# Patient Record
Sex: Female | Born: 1935 | Race: White | Hispanic: No | Marital: Married | Smoking: Never smoker
Health system: Southern US, Community
[De-identification: ages and names within clinical notes are randomized; demographics above are authoritative.]

## PROBLEM LIST (undated history)

## (undated) DIAGNOSIS — Z5189 Encounter for other specified aftercare: Secondary | ICD-10-CM

## (undated) DIAGNOSIS — I3139 Other pericardial effusion (noninflammatory): Secondary | ICD-10-CM

## (undated) DIAGNOSIS — T7841XA Arthus phenomenon, initial encounter: Secondary | ICD-10-CM

## (undated) DIAGNOSIS — R159 Full incontinence of feces: Secondary | ICD-10-CM

## (undated) DIAGNOSIS — N2889 Other specified disorders of kidney and ureter: Secondary | ICD-10-CM

## (undated) DIAGNOSIS — B3781 Candidal esophagitis: Secondary | ICD-10-CM

## (undated) DIAGNOSIS — J849 Interstitial pulmonary disease, unspecified: Secondary | ICD-10-CM

## (undated) DIAGNOSIS — J9610 Chronic respiratory failure, unspecified whether with hypoxia or hypercapnia: Secondary | ICD-10-CM

## (undated) DIAGNOSIS — D649 Anemia, unspecified: Secondary | ICD-10-CM

## (undated) DIAGNOSIS — I2699 Other pulmonary embolism without acute cor pulmonale: Secondary | ICD-10-CM

## (undated) DIAGNOSIS — H1820 Unspecified corneal edema: Secondary | ICD-10-CM

## (undated) DIAGNOSIS — Q2730 Arteriovenous malformation, site unspecified: Secondary | ICD-10-CM

## (undated) DIAGNOSIS — N840 Polyp of corpus uteri: Secondary | ICD-10-CM

## (undated) DIAGNOSIS — I272 Pulmonary hypertension, unspecified: Secondary | ICD-10-CM

## (undated) DIAGNOSIS — N1832 Chronic kidney disease, stage 3b: Secondary | ICD-10-CM

## (undated) DIAGNOSIS — K219 Gastro-esophageal reflux disease without esophagitis: Secondary | ICD-10-CM

## (undated) DIAGNOSIS — M341 CR(E)ST syndrome: Secondary | ICD-10-CM

## (undated) DIAGNOSIS — K31819 Angiodysplasia of stomach and duodenum without bleeding: Secondary | ICD-10-CM

## (undated) DIAGNOSIS — Z9889 Other specified postprocedural states: Secondary | ICD-10-CM

## (undated) DIAGNOSIS — D126 Benign neoplasm of colon, unspecified: Secondary | ICD-10-CM

## (undated) DIAGNOSIS — C50919 Malignant neoplasm of unspecified site of unspecified female breast: Secondary | ICD-10-CM

## (undated) DIAGNOSIS — H269 Unspecified cataract: Secondary | ICD-10-CM

## (undated) DIAGNOSIS — N189 Chronic kidney disease, unspecified: Secondary | ICD-10-CM

## (undated) DIAGNOSIS — M349 Systemic sclerosis, unspecified: Secondary | ICD-10-CM

## (undated) DIAGNOSIS — C649 Malignant neoplasm of unspecified kidney, except renal pelvis: Secondary | ICD-10-CM

## (undated) DIAGNOSIS — H18529 Epithelial (juvenile) corneal dystrophy, unspecified eye: Secondary | ICD-10-CM

## (undated) DIAGNOSIS — R112 Nausea with vomiting, unspecified: Secondary | ICD-10-CM

## (undated) DIAGNOSIS — I350 Nonrheumatic aortic (valve) stenosis: Secondary | ICD-10-CM

## (undated) HISTORY — DX: Polyp of corpus uteri: N84.0

## (undated) HISTORY — DX: Unspecified cataract: H26.9

## (undated) HISTORY — DX: Benign neoplasm of colon, unspecified: D12.6

## (undated) HISTORY — PX: KIDNEY SURGERY: SHX687

## (undated) HISTORY — DX: Malignant neoplasm of unspecified kidney, except renal pelvis: C64.9

## (undated) HISTORY — DX: Epithelial (juvenile) corneal dystrophy, unspecified eye: H18.529

## (undated) HISTORY — DX: Pulmonary hypertension, unspecified: I27.20

## (undated) HISTORY — PX: TONSILLECTOMY: SUR1361

## (undated) HISTORY — DX: Other pericardial effusion (noninflammatory): I31.39

## (undated) HISTORY — DX: Other specified disorders of kidney and ureter: N28.89

## (undated) HISTORY — DX: Full incontinence of feces: R15.9

## (undated) HISTORY — DX: Arthus phenomenon, initial encounter: T78.41XA

## (undated) HISTORY — DX: Candidal esophagitis: B37.81

## (undated) HISTORY — DX: Angiodysplasia of stomach and duodenum without bleeding: K31.819

## (undated) HISTORY — DX: Unspecified corneal edema: H18.20

## (undated) HISTORY — PX: OTHER SURGICAL HISTORY: SHX169

## (undated) HISTORY — DX: Systemic sclerosis, unspecified: M34.9

## (undated) HISTORY — PX: TUBAL LIGATION: SHX77

## (undated) HISTORY — DX: Arteriovenous malformation, site unspecified: Q27.30

## (undated) HISTORY — DX: Interstitial pulmonary disease, unspecified: J84.9

## (undated) HISTORY — DX: Malignant neoplasm of unspecified site of unspecified female breast: C50.919

---

## 1960-10-20 HISTORY — PX: APPENDECTOMY: SHX54

## 1987-10-21 DIAGNOSIS — C50919 Malignant neoplasm of unspecified site of unspecified female breast: Secondary | ICD-10-CM

## 1987-10-21 HISTORY — DX: Malignant neoplasm of unspecified site of unspecified female breast: C50.919

## 1987-10-21 HISTORY — PX: BREAST LUMPECTOMY: SHX2

## 1998-08-17 ENCOUNTER — Ambulatory Visit (HOSPITAL_COMMUNITY): Admission: RE | Admit: 1998-08-17 | Discharge: 1998-08-17 | Payer: Self-pay | Admitting: Family Medicine

## 1998-08-23 ENCOUNTER — Ambulatory Visit: Admission: RE | Admit: 1998-08-23 | Discharge: 1998-08-23 | Payer: Self-pay | Admitting: Family Medicine

## 1999-01-10 ENCOUNTER — Ambulatory Visit (HOSPITAL_BASED_OUTPATIENT_CLINIC_OR_DEPARTMENT_OTHER): Admission: RE | Admit: 1999-01-10 | Discharge: 1999-01-10 | Payer: Self-pay | Admitting: Orthopedic Surgery

## 1999-03-28 ENCOUNTER — Other Ambulatory Visit: Admission: RE | Admit: 1999-03-28 | Discharge: 1999-03-28 | Payer: Self-pay | Admitting: Gynecology

## 1999-05-17 ENCOUNTER — Ambulatory Visit: Admission: RE | Admit: 1999-05-17 | Discharge: 1999-05-17 | Payer: Self-pay | Admitting: Internal Medicine

## 2000-02-13 ENCOUNTER — Encounter (INDEPENDENT_AMBULATORY_CARE_PROVIDER_SITE_OTHER): Payer: Self-pay | Admitting: Specialist

## 2000-02-13 ENCOUNTER — Other Ambulatory Visit: Admission: RE | Admit: 2000-02-13 | Discharge: 2000-02-13 | Payer: Self-pay | Admitting: Internal Medicine

## 2000-02-18 ENCOUNTER — Encounter (HOSPITAL_COMMUNITY): Admission: RE | Admit: 2000-02-18 | Discharge: 2000-05-18 | Payer: Self-pay | Admitting: Internal Medicine

## 2000-04-30 ENCOUNTER — Other Ambulatory Visit: Admission: RE | Admit: 2000-04-30 | Discharge: 2000-04-30 | Payer: Self-pay | Admitting: Gynecology

## 2000-09-17 ENCOUNTER — Encounter: Admission: RE | Admit: 2000-09-17 | Discharge: 2000-09-17 | Payer: Self-pay | Admitting: Family Medicine

## 2000-09-17 ENCOUNTER — Encounter: Payer: Self-pay | Admitting: Family Medicine

## 2001-05-04 ENCOUNTER — Other Ambulatory Visit: Admission: RE | Admit: 2001-05-04 | Discharge: 2001-05-04 | Payer: Self-pay | Admitting: Gynecology

## 2001-10-20 DIAGNOSIS — I2699 Other pulmonary embolism without acute cor pulmonale: Secondary | ICD-10-CM

## 2001-10-20 HISTORY — DX: Other pulmonary embolism without acute cor pulmonale: I26.99

## 2001-11-30 ENCOUNTER — Encounter: Payer: Self-pay | Admitting: Family Medicine

## 2001-11-30 ENCOUNTER — Encounter: Admission: RE | Admit: 2001-11-30 | Discharge: 2001-11-30 | Payer: Self-pay | Admitting: Family Medicine

## 2002-01-06 ENCOUNTER — Inpatient Hospital Stay (HOSPITAL_COMMUNITY): Admission: EM | Admit: 2002-01-06 | Discharge: 2002-01-13 | Payer: Self-pay | Admitting: Emergency Medicine

## 2002-08-10 ENCOUNTER — Encounter: Payer: Self-pay | Admitting: Emergency Medicine

## 2002-08-10 ENCOUNTER — Inpatient Hospital Stay (HOSPITAL_COMMUNITY): Admission: EM | Admit: 2002-08-10 | Discharge: 2002-08-19 | Payer: Self-pay | Admitting: Emergency Medicine

## 2002-08-11 ENCOUNTER — Encounter: Payer: Self-pay | Admitting: Family Medicine

## 2002-08-15 ENCOUNTER — Encounter: Payer: Self-pay | Admitting: Internal Medicine

## 2002-10-18 ENCOUNTER — Other Ambulatory Visit: Admission: RE | Admit: 2002-10-18 | Discharge: 2002-10-18 | Payer: Self-pay | Admitting: Gynecology

## 2003-07-10 ENCOUNTER — Encounter (HOSPITAL_COMMUNITY): Admission: RE | Admit: 2003-07-10 | Discharge: 2003-07-10 | Payer: Self-pay | Admitting: Internal Medicine

## 2003-11-23 ENCOUNTER — Other Ambulatory Visit: Admission: RE | Admit: 2003-11-23 | Discharge: 2003-11-23 | Payer: Self-pay | Admitting: Gynecology

## 2003-12-11 ENCOUNTER — Ambulatory Visit (HOSPITAL_COMMUNITY): Admission: RE | Admit: 2003-12-11 | Discharge: 2003-12-11 | Payer: Self-pay | Admitting: Internal Medicine

## 2003-12-11 ENCOUNTER — Encounter: Payer: Self-pay | Admitting: Internal Medicine

## 2003-12-11 ENCOUNTER — Encounter (INDEPENDENT_AMBULATORY_CARE_PROVIDER_SITE_OTHER): Payer: Self-pay | Admitting: *Deleted

## 2004-08-30 ENCOUNTER — Ambulatory Visit: Payer: Self-pay | Admitting: Cardiology

## 2004-09-06 ENCOUNTER — Ambulatory Visit: Payer: Self-pay | Admitting: Internal Medicine

## 2004-09-30 ENCOUNTER — Ambulatory Visit: Payer: Self-pay | Admitting: Cardiovascular Disease

## 2004-10-04 ENCOUNTER — Ambulatory Visit: Payer: Self-pay | Admitting: Internal Medicine

## 2004-10-18 ENCOUNTER — Ambulatory Visit: Payer: Self-pay | Admitting: Internal Medicine

## 2004-10-28 ENCOUNTER — Ambulatory Visit: Payer: Self-pay | Admitting: Cardiology

## 2004-11-08 ENCOUNTER — Ambulatory Visit: Payer: Self-pay | Admitting: Internal Medicine

## 2004-11-13 ENCOUNTER — Encounter: Admission: RE | Admit: 2004-11-13 | Discharge: 2004-11-13 | Payer: Self-pay | Admitting: Internal Medicine

## 2004-11-13 ENCOUNTER — Ambulatory Visit: Payer: Self-pay | Admitting: Internal Medicine

## 2004-11-15 ENCOUNTER — Ambulatory Visit: Payer: Self-pay | Admitting: Internal Medicine

## 2004-11-27 ENCOUNTER — Ambulatory Visit: Payer: Self-pay | Admitting: Internal Medicine

## 2004-12-13 ENCOUNTER — Ambulatory Visit: Payer: Self-pay | Admitting: Internal Medicine

## 2004-12-25 ENCOUNTER — Ambulatory Visit: Payer: Self-pay | Admitting: Cardiology

## 2005-01-10 ENCOUNTER — Ambulatory Visit: Payer: Self-pay | Admitting: Internal Medicine

## 2005-01-16 ENCOUNTER — Ambulatory Visit: Payer: Self-pay | Admitting: Internal Medicine

## 2005-01-24 ENCOUNTER — Ambulatory Visit: Payer: Self-pay | Admitting: *Deleted

## 2005-02-07 ENCOUNTER — Ambulatory Visit: Payer: Self-pay | Admitting: Internal Medicine

## 2005-03-14 ENCOUNTER — Ambulatory Visit: Payer: Self-pay | Admitting: Internal Medicine

## 2005-03-21 ENCOUNTER — Ambulatory Visit: Payer: Self-pay | Admitting: Cardiology

## 2005-03-24 ENCOUNTER — Ambulatory Visit: Payer: Self-pay | Admitting: Internal Medicine

## 2005-04-04 ENCOUNTER — Ambulatory Visit: Payer: Self-pay | Admitting: Cardiology

## 2005-04-14 ENCOUNTER — Ambulatory Visit: Payer: Self-pay | Admitting: Internal Medicine

## 2005-05-02 ENCOUNTER — Ambulatory Visit: Payer: Self-pay | Admitting: Cardiovascular Disease

## 2005-05-13 ENCOUNTER — Ambulatory Visit: Payer: Self-pay | Admitting: Internal Medicine

## 2005-05-16 ENCOUNTER — Ambulatory Visit: Payer: Self-pay | Admitting: Cardiology

## 2005-05-30 ENCOUNTER — Ambulatory Visit: Payer: Self-pay | Admitting: Cardiology

## 2005-06-17 ENCOUNTER — Ambulatory Visit: Payer: Self-pay | Admitting: Internal Medicine

## 2005-06-27 ENCOUNTER — Ambulatory Visit: Payer: Self-pay | Admitting: Internal Medicine

## 2005-07-11 ENCOUNTER — Ambulatory Visit: Payer: Self-pay | Admitting: Internal Medicine

## 2005-07-25 ENCOUNTER — Ambulatory Visit: Payer: Self-pay | Admitting: Cardiology

## 2005-07-28 ENCOUNTER — Ambulatory Visit: Payer: Self-pay | Admitting: Internal Medicine

## 2005-08-08 ENCOUNTER — Ambulatory Visit: Payer: Self-pay | Admitting: Internal Medicine

## 2005-08-11 ENCOUNTER — Ambulatory Visit: Payer: Self-pay | Admitting: Internal Medicine

## 2005-08-15 ENCOUNTER — Ambulatory Visit: Payer: Self-pay | Admitting: Internal Medicine

## 2005-08-28 ENCOUNTER — Ambulatory Visit: Payer: Self-pay | Admitting: Internal Medicine

## 2005-09-08 ENCOUNTER — Ambulatory Visit: Payer: Self-pay | Admitting: Internal Medicine

## 2005-09-08 ENCOUNTER — Ambulatory Visit: Payer: Self-pay | Admitting: Cardiology

## 2005-09-09 ENCOUNTER — Ambulatory Visit: Payer: Self-pay | Admitting: Internal Medicine

## 2005-10-03 ENCOUNTER — Ambulatory Visit: Payer: Self-pay | Admitting: Cardiology

## 2005-10-10 ENCOUNTER — Ambulatory Visit: Payer: Self-pay | Admitting: Internal Medicine

## 2005-10-31 ENCOUNTER — Ambulatory Visit: Payer: Self-pay | Admitting: Cardiology

## 2005-11-07 ENCOUNTER — Ambulatory Visit: Payer: Self-pay | Admitting: Internal Medicine

## 2005-11-28 ENCOUNTER — Ambulatory Visit: Payer: Self-pay | Admitting: Internal Medicine

## 2005-12-05 ENCOUNTER — Ambulatory Visit: Payer: Self-pay | Admitting: Internal Medicine

## 2005-12-20 ENCOUNTER — Ambulatory Visit: Payer: Self-pay | Admitting: Family Medicine

## 2005-12-23 ENCOUNTER — Ambulatory Visit: Payer: Self-pay | Admitting: Internal Medicine

## 2005-12-29 ENCOUNTER — Ambulatory Visit: Payer: Self-pay | Admitting: Internal Medicine

## 2006-01-02 ENCOUNTER — Ambulatory Visit: Payer: Self-pay | Admitting: Internal Medicine

## 2006-01-06 ENCOUNTER — Ambulatory Visit: Payer: Self-pay | Admitting: Internal Medicine

## 2006-01-09 ENCOUNTER — Ambulatory Visit: Payer: Self-pay | Admitting: Cardiology

## 2006-01-09 ENCOUNTER — Ambulatory Visit: Payer: Self-pay | Admitting: Internal Medicine

## 2006-01-22 ENCOUNTER — Ambulatory Visit: Payer: Self-pay | Admitting: Cardiology

## 2006-02-13 ENCOUNTER — Ambulatory Visit: Payer: Self-pay | Admitting: Cardiology

## 2006-02-13 ENCOUNTER — Ambulatory Visit: Payer: Self-pay | Admitting: Internal Medicine

## 2006-03-13 ENCOUNTER — Ambulatory Visit: Payer: Self-pay | Admitting: Cardiology

## 2006-03-13 ENCOUNTER — Ambulatory Visit: Payer: Self-pay | Admitting: Internal Medicine

## 2006-04-03 ENCOUNTER — Ambulatory Visit: Payer: Self-pay | Admitting: Cardiology

## 2006-04-10 ENCOUNTER — Ambulatory Visit: Payer: Self-pay | Admitting: Internal Medicine

## 2006-04-30 ENCOUNTER — Ambulatory Visit: Payer: Self-pay | Admitting: Internal Medicine

## 2006-05-01 ENCOUNTER — Ambulatory Visit: Payer: Self-pay | Admitting: Internal Medicine

## 2006-05-07 ENCOUNTER — Ambulatory Visit: Payer: Self-pay | Admitting: Internal Medicine

## 2006-05-08 ENCOUNTER — Ambulatory Visit: Payer: Self-pay | Admitting: Internal Medicine

## 2006-05-29 ENCOUNTER — Ambulatory Visit: Payer: Self-pay | Admitting: Internal Medicine

## 2006-06-10 ENCOUNTER — Ambulatory Visit: Payer: Self-pay | Admitting: Internal Medicine

## 2006-06-26 ENCOUNTER — Ambulatory Visit: Payer: Self-pay | Admitting: Cardiology

## 2006-07-03 ENCOUNTER — Ambulatory Visit: Payer: Self-pay | Admitting: Internal Medicine

## 2006-07-08 ENCOUNTER — Ambulatory Visit: Payer: Self-pay | Admitting: Internal Medicine

## 2006-07-17 ENCOUNTER — Ambulatory Visit: Payer: Self-pay | Admitting: Internal Medicine

## 2006-07-20 ENCOUNTER — Ambulatory Visit: Payer: Self-pay | Admitting: Internal Medicine

## 2006-07-21 ENCOUNTER — Encounter (INDEPENDENT_AMBULATORY_CARE_PROVIDER_SITE_OTHER): Payer: Self-pay | Admitting: *Deleted

## 2006-07-21 ENCOUNTER — Ambulatory Visit: Payer: Self-pay | Admitting: *Deleted

## 2006-07-24 ENCOUNTER — Ambulatory Visit: Payer: Self-pay | Admitting: Cardiology

## 2006-08-03 ENCOUNTER — Ambulatory Visit: Payer: Self-pay | Admitting: Internal Medicine

## 2006-08-07 ENCOUNTER — Ambulatory Visit: Payer: Self-pay | Admitting: Internal Medicine

## 2006-08-07 LAB — CONVERTED CEMR LAB
ALT: 21 units/L (ref 0–40)
Albumin: 3.9 g/dL (ref 3.5–5.2)
Bilirubin, Direct: 0.1 mg/dL (ref 0.0–0.3)
Calcium: 9.3 mg/dL (ref 8.4–10.5)
Chloride: 106 meq/L (ref 96–112)
Creatinine, Ser: 1.3 mg/dL — ABNORMAL HIGH (ref 0.4–1.2)
Glucose, Bld: 87 mg/dL (ref 70–99)
HCT: 27.9 % — ABNORMAL LOW (ref 36.0–46.0)
Hemoglobin: 9.3 g/dL — ABNORMAL LOW (ref 12.0–15.0)
Platelets: 229 10*3/uL (ref 150–400)
Potassium: 4.7 meq/L (ref 3.5–5.1)
Sodium: 140 meq/L (ref 135–145)
WBC: 5.6 10*3/uL (ref 4.5–10.5)

## 2006-08-21 ENCOUNTER — Ambulatory Visit: Payer: Self-pay | Admitting: Internal Medicine

## 2006-09-01 ENCOUNTER — Ambulatory Visit: Payer: Self-pay | Admitting: Internal Medicine

## 2006-09-04 ENCOUNTER — Ambulatory Visit: Payer: Self-pay | Admitting: Internal Medicine

## 2006-09-04 LAB — CONVERTED CEMR LAB
Albumin: 4.2 g/dL (ref 3.5–5.2)
Basophils Absolute: 0 10*3/uL (ref 0.0–0.1)
Basophils Relative: 0.3 % (ref 0.0–1.0)
CO2: 27 meq/L (ref 19–32)
Creatinine, Ser: 1.9 mg/dL — ABNORMAL HIGH (ref 0.4–1.2)
Glomerular Filtration Rate, Af Am: 34 mL/min/{1.73_m2}
HCT: 30.3 % — ABNORMAL LOW (ref 36.0–46.0)
MCV: 96 fL (ref 78.0–100.0)
Monocytes Absolute: 0.4 10*3/uL (ref 0.2–0.7)
Neutro Abs: 3.9 10*3/uL (ref 1.4–7.7)
Neutrophils Relative %: 66.7 % (ref 43.0–77.0)
Platelets: 263 10*3/uL (ref 150–400)
Potassium: 4 meq/L (ref 3.5–5.1)
RBC: 3.16 M/uL — ABNORMAL LOW (ref 3.87–5.11)
RDW: 14.3 % (ref 11.5–14.6)
Sodium: 138 meq/L (ref 135–145)
Total Protein: 7.3 g/dL (ref 6.0–8.3)
WBC: 5.8 10*3/uL (ref 4.5–10.5)

## 2006-09-18 ENCOUNTER — Ambulatory Visit: Payer: Self-pay | Admitting: Cardiology

## 2006-09-28 ENCOUNTER — Ambulatory Visit (HOSPITAL_COMMUNITY): Admission: RE | Admit: 2006-09-28 | Discharge: 2006-09-28 | Payer: Self-pay | Admitting: Cardiology

## 2006-10-02 ENCOUNTER — Ambulatory Visit: Payer: Self-pay | Admitting: Internal Medicine

## 2006-10-02 LAB — CONVERTED CEMR LAB
Bilirubin, Direct: 0.1 mg/dL (ref 0.0–0.3)
CO2: 25 meq/L (ref 19–32)
Calcium: 9.6 mg/dL (ref 8.4–10.5)
Chloride: 105 meq/L (ref 96–112)
Creatinine, Ser: 1.5 mg/dL — ABNORMAL HIGH (ref 0.4–1.2)
Glucose, Bld: 101 mg/dL — ABNORMAL HIGH (ref 70–99)
Hemoglobin: 10.3 g/dL — ABNORMAL LOW (ref 12.0–15.0)
MCV: 96.5 fL (ref 78.0–100.0)
Platelets: 240 10*3/uL (ref 150–400)
Potassium: 4.1 meq/L (ref 3.5–5.1)
RBC: 3.29 M/uL — ABNORMAL LOW (ref 3.87–5.11)
RDW: 14 % (ref 11.5–14.6)
Sodium: 138 meq/L (ref 135–145)

## 2006-10-05 ENCOUNTER — Emergency Department (HOSPITAL_COMMUNITY): Admission: EM | Admit: 2006-10-05 | Discharge: 2006-10-06 | Payer: Self-pay | Admitting: Emergency Medicine

## 2006-10-09 ENCOUNTER — Ambulatory Visit: Payer: Self-pay | Admitting: Internal Medicine

## 2006-10-15 ENCOUNTER — Ambulatory Visit: Payer: Self-pay | Admitting: Internal Medicine

## 2006-10-16 ENCOUNTER — Inpatient Hospital Stay (HOSPITAL_COMMUNITY): Admission: EM | Admit: 2006-10-16 | Discharge: 2006-10-19 | Payer: Self-pay | Admitting: Emergency Medicine

## 2006-10-16 ENCOUNTER — Ambulatory Visit: Payer: Self-pay | Admitting: Cardiology

## 2006-10-19 ENCOUNTER — Ambulatory Visit: Payer: Self-pay | Admitting: Internal Medicine

## 2006-10-21 ENCOUNTER — Ambulatory Visit: Payer: Self-pay | Admitting: Gastroenterology

## 2006-10-22 ENCOUNTER — Ambulatory Visit: Payer: Self-pay | Admitting: Internal Medicine

## 2006-10-22 LAB — CONVERTED CEMR LAB
HCT: 32.3 % — ABNORMAL LOW (ref 36.0–46.0)
Hemoglobin: 11.1 g/dL — ABNORMAL LOW (ref 12.0–15.0)
MCV: 94.4 fL (ref 78.0–100.0)
Platelets: 376 10*3/uL (ref 150–400)
RBC: 3.43 M/uL — ABNORMAL LOW (ref 3.87–5.11)
WBC: 5.4 10*3/uL (ref 4.5–10.5)

## 2006-10-26 ENCOUNTER — Ambulatory Visit: Payer: Self-pay | Admitting: Cardiology

## 2006-11-06 ENCOUNTER — Ambulatory Visit: Payer: Self-pay | Admitting: Internal Medicine

## 2006-11-06 LAB — CONVERTED CEMR LAB
ALT: 21 units/L (ref 0–40)
Albumin: 3.6 g/dL (ref 3.5–5.2)
Alkaline Phosphatase: 46 units/L (ref 39–117)
Basophils Absolute: 0.1 10*3/uL (ref 0.0–0.1)
CO2: 29 meq/L (ref 19–32)
Chloride: 107 meq/L (ref 96–112)
Creatinine, Ser: 1.3 mg/dL — ABNORMAL HIGH (ref 0.4–1.2)
GFR calc Af Amer: 52 mL/min
GFR calc non Af Amer: 43 mL/min
Lymphocytes Relative: 24.6 % (ref 12.0–46.0)
MCHC: 32.4 g/dL (ref 30.0–36.0)
MCV: 95.7 fL (ref 78.0–100.0)
Monocytes Relative: 6.1 % (ref 3.0–11.0)
Neutro Abs: 3.6 10*3/uL (ref 1.4–7.7)
Platelets: 189 10*3/uL (ref 150–400)
Sodium: 141 meq/L (ref 135–145)
Total Bilirubin: 0.5 mg/dL (ref 0.3–1.2)
Total Protein: 6.5 g/dL (ref 6.0–8.3)

## 2006-11-23 ENCOUNTER — Ambulatory Visit: Payer: Self-pay | Admitting: Internal Medicine

## 2006-11-27 ENCOUNTER — Ambulatory Visit: Payer: Self-pay | Admitting: Internal Medicine

## 2006-12-11 ENCOUNTER — Ambulatory Visit: Payer: Self-pay | Admitting: Internal Medicine

## 2006-12-11 LAB — CONVERTED CEMR LAB
Basophils Relative: 0.7 % (ref 0.0–1.0)
Eosinophils Absolute: 0.1 10*3/uL (ref 0.0–0.6)
Eosinophils Relative: 1.5 % (ref 0.0–5.0)
Platelets: 224 10*3/uL (ref 150–400)
RBC: 3.31 M/uL — ABNORMAL LOW (ref 3.87–5.11)
Retic Count, Absolute: 42.4 (ref 19.0–186.0)
Retic Ct Pct: 1.2 % (ref 0.4–3.1)
Saturation Ratios: 26.1 % (ref 20.0–50.0)
Transferrin: 166.8 mg/dL — ABNORMAL LOW (ref >=212.0)
WBC: 6.3 10*3/uL (ref 4.5–10.5)

## 2006-12-25 ENCOUNTER — Ambulatory Visit: Payer: Self-pay | Admitting: Cardiology

## 2007-01-11 ENCOUNTER — Ambulatory Visit: Payer: Self-pay | Admitting: Internal Medicine

## 2007-01-11 LAB — CONVERTED CEMR LAB
ALT: 21 units/L (ref 0–40)
Alkaline Phosphatase: 43 units/L (ref 39–117)
BUN: 41 mg/dL — ABNORMAL HIGH (ref 6–23)
Basophils Relative: 0.7 % (ref 0.0–1.0)
Bilirubin, Direct: 0.1 mg/dL (ref 0.0–0.3)
CO2: 31 meq/L (ref 19–32)
GFR calc Af Amer: 38 mL/min
Hemoglobin: 11.3 g/dL — ABNORMAL LOW (ref 12.0–15.0)
Lymphocytes Relative: 21.5 % (ref 12.0–46.0)
MCHC: 35.1 g/dL (ref 30.0–36.0)
Monocytes Absolute: 0.5 10*3/uL (ref 0.2–0.7)
Monocytes Relative: 6.1 % (ref 3.0–11.0)
Neutro Abs: 5.8 10*3/uL (ref 1.4–7.7)
Total Bilirubin: 0.9 mg/dL (ref 0.3–1.2)
Total Protein: 7.2 g/dL (ref 6.0–8.3)

## 2007-01-25 ENCOUNTER — Ambulatory Visit: Payer: Self-pay | Admitting: Cardiology

## 2007-02-12 ENCOUNTER — Ambulatory Visit: Payer: Self-pay | Admitting: Internal Medicine

## 2007-02-12 LAB — CONVERTED CEMR LAB
Basophils Relative: 0.5 % (ref 0.0–1.0)
Bilirubin, Direct: 0.1 mg/dL (ref 0.0–0.3)
CO2: 27 meq/L (ref 19–32)
Creatinine, Ser: 1.3 mg/dL — ABNORMAL HIGH (ref 0.4–1.2)
Glucose, Bld: 97 mg/dL (ref 70–99)
Lymphocytes Relative: 23.4 % (ref 12.0–46.0)
Monocytes Absolute: 0.4 10*3/uL (ref 0.2–0.7)
Neutrophils Relative %: 68.4 % (ref 43.0–77.0)
Platelets: 211 10*3/uL (ref 150–400)
Potassium: 4.1 meq/L (ref 3.5–5.1)
RBC: 3.25 M/uL — ABNORMAL LOW (ref 3.87–5.11)
RDW: 12.9 % (ref 11.5–14.6)
Sodium: 139 meq/L (ref 135–145)
Total Bilirubin: 0.7 mg/dL (ref 0.3–1.2)
Total Protein: 6.9 g/dL (ref 6.0–8.3)
WBC: 5.8 10*3/uL (ref 4.5–10.5)

## 2007-02-19 ENCOUNTER — Ambulatory Visit: Payer: Self-pay | Admitting: Internal Medicine

## 2007-03-12 ENCOUNTER — Ambulatory Visit: Payer: Self-pay | Admitting: Internal Medicine

## 2007-03-12 LAB — CONVERTED CEMR LAB
ALT: 18 units/L (ref 0–40)
AST: 28 units/L (ref 0–37)
Albumin: 3.9 g/dL (ref 3.5–5.2)
Alkaline Phosphatase: 38 units/L — ABNORMAL LOW (ref 39–117)
BUN: 44 mg/dL — ABNORMAL HIGH (ref 6–23)
Basophils Absolute: 0 10*3/uL (ref 0.0–0.1)
Creatinine, Ser: 1.3 mg/dL — ABNORMAL HIGH (ref 0.4–1.2)
Eosinophils Absolute: 0.1 10*3/uL (ref 0.0–0.6)
GFR calc Af Amer: 52 mL/min
GFR calc non Af Amer: 43 mL/min
MCHC: 33.3 g/dL (ref 30.0–36.0)
MCV: 98.1 fL (ref 78.0–100.0)
Monocytes Absolute: 0.5 10*3/uL (ref 0.2–0.7)
Monocytes Relative: 8.2 % (ref 3.0–11.0)
RBC: 3.39 M/uL — ABNORMAL LOW (ref 3.87–5.11)
RDW: 12.4 % (ref 11.5–14.6)

## 2007-03-17 ENCOUNTER — Ambulatory Visit: Payer: Self-pay | Admitting: Internal Medicine

## 2007-03-19 ENCOUNTER — Ambulatory Visit: Payer: Self-pay | Admitting: Internal Medicine

## 2007-04-02 ENCOUNTER — Ambulatory Visit: Payer: Self-pay | Admitting: Cardiology

## 2007-04-09 ENCOUNTER — Ambulatory Visit: Payer: Self-pay | Admitting: Internal Medicine

## 2007-04-09 LAB — CONVERTED CEMR LAB
Alkaline Phosphatase: 39 units/L (ref 39–117)
BUN: 39 mg/dL — ABNORMAL HIGH (ref 6–23)
Basophils Relative: 0.8 % (ref 0.0–1.0)
Bilirubin, Direct: 0.1 mg/dL (ref 0.0–0.3)
CO2: 27 meq/L (ref 19–32)
GFR calc Af Amer: 57 mL/min
Glucose, Bld: 96 mg/dL (ref 70–99)
HCT: 31.6 % — ABNORMAL LOW (ref 36.0–46.0)
Lymphocytes Relative: 24.5 % (ref 12.0–46.0)
MCV: 98 fL (ref 78.0–100.0)
Monocytes Absolute: 0.4 10*3/uL (ref 0.2–0.7)
Neutro Abs: 3.7 10*3/uL (ref 1.4–7.7)
Platelets: 224 10*3/uL (ref 150–400)
Potassium: 4.4 meq/L (ref 3.5–5.1)
RBC: 3.23 M/uL — ABNORMAL LOW (ref 3.87–5.11)
Total Protein: 6.6 g/dL (ref 6.0–8.3)
WBC: 5.5 10*3/uL (ref 4.5–10.5)

## 2007-04-12 ENCOUNTER — Ambulatory Visit: Payer: Self-pay | Admitting: Internal Medicine

## 2007-04-16 ENCOUNTER — Ambulatory Visit: Payer: Self-pay | Admitting: Internal Medicine

## 2007-04-30 ENCOUNTER — Ambulatory Visit: Payer: Self-pay | Admitting: Cardiology

## 2007-05-03 ENCOUNTER — Ambulatory Visit: Payer: Self-pay | Admitting: Internal Medicine

## 2007-05-13 ENCOUNTER — Ambulatory Visit: Payer: Self-pay | Admitting: Internal Medicine

## 2007-05-13 ENCOUNTER — Ambulatory Visit: Payer: Self-pay | Admitting: Cardiology

## 2007-05-13 LAB — CONVERTED CEMR LAB
AST: 29 units/L (ref 0–37)
Albumin: 4.1 g/dL (ref 3.5–5.2)
Alkaline Phosphatase: 43 units/L (ref 39–117)
BUN: 35 mg/dL — ABNORMAL HIGH (ref 6–23)
Basophils Absolute: 0 10*3/uL (ref 0.0–0.1)
Calcium: 10.1 mg/dL (ref 8.4–10.5)
Chloride: 97 meq/L (ref 96–112)
Creatinine, Ser: 1.3 mg/dL — ABNORMAL HIGH (ref 0.4–1.2)
Eosinophils Relative: 0.6 % (ref 0.0–5.0)
Hemoglobin: 11.6 g/dL — ABNORMAL LOW (ref 12.0–15.0)
Lymphocytes Relative: 21.1 % (ref 12.0–46.0)
MCV: 96.8 fL (ref 78.0–100.0)
Monocytes Relative: 8.3 % (ref 3.0–11.0)
Platelets: 260 10*3/uL (ref 150–400)
Saturation Ratios: 22.9 % (ref 20.0–50.0)
Total Bilirubin: 0.6 mg/dL (ref 0.3–1.2)
Transferrin: 199.8 mg/dL — ABNORMAL LOW (ref >=212.0)
WBC: 7.8 10*3/uL (ref 4.5–10.5)

## 2007-05-14 ENCOUNTER — Ambulatory Visit: Payer: Self-pay | Admitting: Internal Medicine

## 2007-05-28 ENCOUNTER — Ambulatory Visit: Payer: Self-pay | Admitting: Cardiology

## 2007-06-11 ENCOUNTER — Ambulatory Visit: Payer: Self-pay | Admitting: Cardiology

## 2007-06-23 ENCOUNTER — Ambulatory Visit: Payer: Self-pay | Admitting: Internal Medicine

## 2007-06-25 ENCOUNTER — Ambulatory Visit: Payer: Self-pay | Admitting: Cardiology

## 2007-07-14 ENCOUNTER — Ambulatory Visit: Payer: Self-pay | Admitting: Internal Medicine

## 2007-07-14 LAB — CONVERTED CEMR LAB
ALT: 16 units/L (ref 0–35)
Albumin: 4.1 g/dL (ref 3.5–5.2)
Alkaline Phosphatase: 39 units/L (ref 39–117)
BUN: 44 mg/dL — ABNORMAL HIGH (ref 6–23)
Basophils Absolute: 0 10*3/uL (ref 0.0–0.1)
Basophils Relative: 0.7 % (ref 0.0–1.0)
CO2: 29 meq/L (ref 19–32)
Calcium: 9.6 mg/dL (ref 8.4–10.5)
Chloride: 105 meq/L (ref 96–112)
Creatinine, Ser: 1.6 mg/dL — ABNORMAL HIGH (ref 0.4–1.2)
HCT: 31.9 % — ABNORMAL LOW (ref 36.0–46.0)
MCHC: 34.1 g/dL (ref 30.0–36.0)
Monocytes Relative: 8.1 % (ref 3.0–11.0)
Platelets: 215 10*3/uL (ref 150–400)
RBC: 3.29 M/uL — ABNORMAL LOW (ref 3.87–5.11)
RDW: 13.2 % (ref 11.5–14.6)
Total Bilirubin: 0.7 mg/dL (ref 0.3–1.2)
Total Protein: 7.2 g/dL (ref 6.0–8.3)
WBC: 6 10*3/uL (ref 4.5–10.5)

## 2007-07-23 ENCOUNTER — Ambulatory Visit: Payer: Self-pay | Admitting: Cardiology

## 2007-07-26 ENCOUNTER — Encounter: Payer: Self-pay | Admitting: *Deleted

## 2007-07-26 DIAGNOSIS — K219 Gastro-esophageal reflux disease without esophagitis: Secondary | ICD-10-CM | POA: Insufficient documentation

## 2007-07-26 DIAGNOSIS — Z9089 Acquired absence of other organs: Secondary | ICD-10-CM | POA: Insufficient documentation

## 2007-07-26 DIAGNOSIS — I2729 Other secondary pulmonary hypertension: Secondary | ICD-10-CM | POA: Insufficient documentation

## 2007-07-26 DIAGNOSIS — Z86718 Personal history of other venous thrombosis and embolism: Secondary | ICD-10-CM | POA: Insufficient documentation

## 2007-07-26 DIAGNOSIS — Z9889 Other specified postprocedural states: Secondary | ICD-10-CM | POA: Insufficient documentation

## 2007-07-26 DIAGNOSIS — I10 Essential (primary) hypertension: Secondary | ICD-10-CM | POA: Insufficient documentation

## 2007-07-26 DIAGNOSIS — D509 Iron deficiency anemia, unspecified: Secondary | ICD-10-CM | POA: Insufficient documentation

## 2007-07-26 DIAGNOSIS — I272 Pulmonary hypertension, unspecified: Secondary | ICD-10-CM | POA: Insufficient documentation

## 2007-07-26 DIAGNOSIS — Z9010 Acquired absence of unspecified breast and nipple: Secondary | ICD-10-CM | POA: Insufficient documentation

## 2007-07-26 DIAGNOSIS — C50919 Malignant neoplasm of unspecified site of unspecified female breast: Secondary | ICD-10-CM | POA: Insufficient documentation

## 2007-07-26 DIAGNOSIS — J849 Interstitial pulmonary disease, unspecified: Secondary | ICD-10-CM | POA: Insufficient documentation

## 2007-07-26 DIAGNOSIS — M349 Systemic sclerosis, unspecified: Secondary | ICD-10-CM | POA: Insufficient documentation

## 2007-08-13 ENCOUNTER — Ambulatory Visit: Payer: Self-pay | Admitting: Internal Medicine

## 2007-08-13 LAB — CONVERTED CEMR LAB
Basophils Absolute: 0 10*3/uL (ref 0.0–0.1)
Basophils Relative: 0.7 % (ref 0.0–1.0)
Bilirubin, Direct: 0.1 mg/dL (ref 0.0–0.3)
Calcium: 9.3 mg/dL (ref 8.4–10.5)
Eosinophils Relative: 1.4 % (ref 0.0–5.0)
GFR calc Af Amer: 36 mL/min
GFR calc non Af Amer: 30 mL/min
Glucose, Bld: 127 mg/dL — ABNORMAL HIGH (ref 70–99)
HCT: 31.7 % — ABNORMAL LOW (ref 36.0–46.0)
Hemoglobin: 11 g/dL — ABNORMAL LOW (ref 12.0–15.0)
Monocytes Absolute: 0.5 10*3/uL (ref 0.2–0.7)
Neutrophils Relative %: 65.1 % (ref 43.0–77.0)
RBC: 3.24 M/uL — ABNORMAL LOW (ref 3.87–5.11)
RDW: 12.9 % (ref 11.5–14.6)
Sodium: 138 meq/L (ref 135–145)
WBC: 6.5 10*3/uL (ref 4.5–10.5)

## 2007-08-20 ENCOUNTER — Ambulatory Visit: Payer: Self-pay | Admitting: Cardiology

## 2007-08-30 ENCOUNTER — Ambulatory Visit: Payer: Self-pay | Admitting: Internal Medicine

## 2007-09-10 ENCOUNTER — Ambulatory Visit: Payer: Self-pay | Admitting: Internal Medicine

## 2007-09-10 LAB — CONVERTED CEMR LAB
ALT: 19 units/L (ref 0–35)
AST: 30 units/L (ref 0–37)
Basophils Relative: 0.4 % (ref 0.0–1.0)
CO2: 28 meq/L (ref 19–32)
Calcium: 9.6 mg/dL (ref 8.4–10.5)
Chloride: 103 meq/L (ref 96–112)
Creatinine, Ser: 1.6 mg/dL — ABNORMAL HIGH (ref 0.4–1.2)
Eosinophils Absolute: 0.1 10*3/uL (ref 0.0–0.6)
Eosinophils Relative: 1.6 % (ref 0.0–5.0)
GFR calc non Af Amer: 34 mL/min
Glucose, Bld: 86 mg/dL (ref 70–99)
MCHC: 33.9 g/dL (ref 30.0–36.0)
MCV: 98.4 fL (ref 78.0–100.0)
Neutrophils Relative %: 70.9 % (ref 43.0–77.0)
RBC: 3.24 M/uL — ABNORMAL LOW (ref 3.87–5.11)
Sodium: 139 meq/L (ref 135–145)
Total Bilirubin: 0.7 mg/dL (ref 0.3–1.2)
Total Protein: 7.1 g/dL (ref 6.0–8.3)

## 2007-09-11 ENCOUNTER — Encounter: Payer: Self-pay | Admitting: Internal Medicine

## 2007-09-20 ENCOUNTER — Ambulatory Visit: Payer: Self-pay | Admitting: Internal Medicine

## 2007-09-27 ENCOUNTER — Telehealth: Payer: Self-pay | Admitting: Internal Medicine

## 2007-10-04 ENCOUNTER — Ambulatory Visit: Payer: Self-pay | Admitting: Internal Medicine

## 2007-10-08 ENCOUNTER — Ambulatory Visit: Payer: Self-pay | Admitting: Internal Medicine

## 2007-10-08 LAB — CONVERTED CEMR LAB
Albumin: 3.9 g/dL (ref 3.5–5.2)
Basophils Absolute: 0 10*3/uL (ref 0.0–0.1)
Bilirubin, Direct: 0.1 mg/dL (ref 0.0–0.3)
Creatinine, Ser: 1.4 mg/dL — ABNORMAL HIGH (ref 0.4–1.2)
Eosinophils Absolute: 0.1 10*3/uL (ref 0.0–0.6)
Glucose, Bld: 104 mg/dL — ABNORMAL HIGH (ref 70–99)
HCT: 30.9 % — ABNORMAL LOW (ref 36.0–46.0)
Hemoglobin: 10.2 g/dL — ABNORMAL LOW (ref 12.0–15.0)
MCHC: 33.2 g/dL (ref 30.0–36.0)
MCV: 97 fL (ref 78.0–100.0)
Monocytes Absolute: 0.3 10*3/uL (ref 0.2–0.7)
Neutrophils Relative %: 67.7 % (ref 43.0–77.0)
Potassium: 4.3 meq/L (ref 3.5–5.1)
RDW: 13.1 % (ref 11.5–14.6)
Sodium: 140 meq/L (ref 135–145)
Total Bilirubin: 0.8 mg/dL (ref 0.3–1.2)

## 2007-10-10 ENCOUNTER — Encounter: Payer: Self-pay | Admitting: Internal Medicine

## 2007-10-18 ENCOUNTER — Telehealth: Payer: Self-pay | Admitting: Internal Medicine

## 2007-10-29 ENCOUNTER — Ambulatory Visit: Payer: Self-pay | Admitting: Internal Medicine

## 2007-11-08 ENCOUNTER — Ambulatory Visit: Payer: Self-pay | Admitting: Cardiology

## 2007-11-12 ENCOUNTER — Ambulatory Visit: Payer: Self-pay | Admitting: Internal Medicine

## 2007-11-12 LAB — CONVERTED CEMR LAB
ALT: 17 units/L (ref 0–35)
AST: 27 units/L (ref 0–37)
Basophils Relative: 0.8 % (ref 0.0–1.0)
Bilirubin, Direct: 0.1 mg/dL (ref 0.0–0.3)
CO2: 25 meq/L (ref 19–32)
Calcium: 9.3 mg/dL (ref 8.4–10.5)
Chloride: 101 meq/L (ref 96–112)
Creatinine, Ser: 1.3 mg/dL — ABNORMAL HIGH (ref 0.4–1.2)
Eosinophils Relative: 1.3 % (ref 0.0–5.0)
Glucose, Bld: 97 mg/dL (ref 70–99)
Hemoglobin: 10.5 g/dL — ABNORMAL LOW (ref 12.0–15.0)
Lymphocytes Relative: 23.3 % (ref 12.0–46.0)
RDW: 12.7 % (ref 11.5–14.6)
Total Protein: 6.9 g/dL (ref 6.0–8.3)
WBC: 6.9 10*3/uL (ref 4.5–10.5)

## 2007-11-15 ENCOUNTER — Telehealth: Payer: Self-pay | Admitting: Internal Medicine

## 2007-11-18 ENCOUNTER — Encounter: Payer: Self-pay | Admitting: Internal Medicine

## 2007-11-19 ENCOUNTER — Encounter: Payer: Self-pay | Admitting: Emergency Medicine

## 2007-11-26 ENCOUNTER — Ambulatory Visit: Payer: Self-pay | Admitting: Internal Medicine

## 2007-12-13 ENCOUNTER — Encounter: Payer: Self-pay | Admitting: Internal Medicine

## 2007-12-15 ENCOUNTER — Ambulatory Visit: Payer: Self-pay | Admitting: Internal Medicine

## 2007-12-17 ENCOUNTER — Ambulatory Visit: Payer: Self-pay | Admitting: Internal Medicine

## 2007-12-17 ENCOUNTER — Ambulatory Visit: Payer: Self-pay | Admitting: Cardiology

## 2007-12-17 LAB — CONVERTED CEMR LAB
Basophils Absolute: 0.1 10*3/uL (ref 0.0–0.1)
Eosinophils Absolute: 0.1 10*3/uL (ref 0.0–0.6)
Lymphocytes Relative: 24.8 % (ref 12.0–46.0)
MCHC: 32.5 g/dL (ref 30.0–36.0)
MCV: 97.6 fL (ref 78.0–100.0)
Platelets: 226 10*3/uL (ref 150–400)
RBC: 3.2 M/uL — ABNORMAL LOW (ref 3.87–5.11)
RDW: 12.8 % (ref 11.5–14.6)
Saturation Ratios: 22.2 % (ref 20.0–50.0)
Transferrin: 203 mg/dL — ABNORMAL LOW (ref >=212.0)
Vitamin B-12: 622 pg/mL (ref 211–911)
WBC: 7.2 10*3/uL (ref 4.5–10.5)

## 2007-12-19 ENCOUNTER — Telehealth: Payer: Self-pay | Admitting: Internal Medicine

## 2008-01-07 ENCOUNTER — Ambulatory Visit: Payer: Self-pay | Admitting: Internal Medicine

## 2008-01-07 ENCOUNTER — Telehealth (INDEPENDENT_AMBULATORY_CARE_PROVIDER_SITE_OTHER): Payer: Self-pay | Admitting: *Deleted

## 2008-01-07 LAB — CONVERTED CEMR LAB
ALT: 16 units/L (ref 0–35)
AST: 27 units/L (ref 0–37)
AST: 27 units/L (ref 0–37)
Albumin: 3.9 g/dL (ref 3.5–5.2)
Basophils Relative: 0.6 % (ref 0.0–1.0)
Basophils Relative: 0.6 % (ref 0.0–1.0)
Bilirubin, Direct: 0.1 mg/dL (ref 0.0–0.3)
CO2: 28 meq/L (ref 19–32)
CO2: 28 meq/L (ref 19–32)
Calcium: 9.7 mg/dL (ref 8.4–10.5)
Calcium: 9.7 mg/dL (ref 8.4–10.5)
Chloride: 105 meq/L (ref 96–112)
Chloride: 105 meq/L (ref 96–112)
Creatinine, Ser: 1.4 mg/dL — ABNORMAL HIGH (ref 0.4–1.2)
Creatinine, Ser: 1.4 mg/dL — ABNORMAL HIGH (ref 0.4–1.2)
Eosinophils Absolute: 0.1 10*3/uL (ref 0.0–0.6)
Eosinophils Relative: 1.6 % (ref 0.0–5.0)
Eosinophils Relative: 1.6 % (ref 0.0–5.0)
GFR calc non Af Amer: 40 mL/min
GFR calc non Af Amer: 40 mL/min
Glucose, Bld: 96 mg/dL (ref 70–99)
Glucose, Bld: 96 mg/dL (ref 70–99)
HCT: 29.7 % — ABNORMAL LOW (ref 36.0–46.0)
HCT: 29.7 % — ABNORMAL LOW (ref 36.0–46.0)
Hemoglobin: 9.7 g/dL — ABNORMAL LOW (ref 12.0–15.0)
Lymphocytes Relative: 26.7 % (ref 12.0–46.0)
MCV: 97.2 fL (ref 78.0–100.0)
Neutrophils Relative %: 63.9 % (ref 43.0–77.0)
RBC: 3.05 M/uL — ABNORMAL LOW (ref 3.87–5.11)
RDW: 13.5 % (ref 11.5–14.6)
Sodium: 138 meq/L (ref 135–145)
Total Bilirubin: 0.6 mg/dL (ref 0.3–1.2)
Total Protein: 7.2 g/dL (ref 6.0–8.3)
VLDL: 14 mg/dL (ref 0–40)
WBC: 5.7 10*3/uL (ref 4.5–10.5)
WBC: 5.7 10*3/uL (ref 4.5–10.5)

## 2008-01-08 ENCOUNTER — Encounter: Payer: Self-pay | Admitting: Internal Medicine

## 2008-01-13 ENCOUNTER — Ambulatory Visit: Payer: Self-pay | Admitting: Internal Medicine

## 2008-01-24 ENCOUNTER — Encounter: Payer: Self-pay | Admitting: Internal Medicine

## 2008-02-02 ENCOUNTER — Ambulatory Visit: Payer: Self-pay | Admitting: Internal Medicine

## 2008-02-03 ENCOUNTER — Ambulatory Visit: Payer: Self-pay | Admitting: Cardiology

## 2008-02-14 ENCOUNTER — Encounter: Admission: RE | Admit: 2008-02-14 | Discharge: 2008-02-14 | Payer: Self-pay | Admitting: Internal Medicine

## 2008-02-14 ENCOUNTER — Encounter: Payer: Self-pay | Admitting: Internal Medicine

## 2008-02-23 ENCOUNTER — Ambulatory Visit: Payer: Self-pay | Admitting: Cardiology

## 2008-03-08 ENCOUNTER — Ambulatory Visit: Payer: Self-pay | Admitting: Internal Medicine

## 2008-03-10 ENCOUNTER — Ambulatory Visit: Payer: Self-pay | Admitting: Internal Medicine

## 2008-03-13 ENCOUNTER — Encounter: Payer: Self-pay | Admitting: Internal Medicine

## 2008-03-16 ENCOUNTER — Telehealth (INDEPENDENT_AMBULATORY_CARE_PROVIDER_SITE_OTHER): Payer: Self-pay | Admitting: *Deleted

## 2008-03-22 LAB — CONVERTED CEMR LAB
AST: 30 units/L (ref 0–37)
Basophils Relative: 1.1 % — ABNORMAL HIGH (ref 0.0–1.0)
CO2: 26 meq/L (ref 19–32)
Calcium: 9.7 mg/dL (ref 8.4–10.5)
Chloride: 112 meq/L (ref 96–112)
Creatinine, Ser: 1.3 mg/dL — ABNORMAL HIGH (ref 0.4–1.2)
Eosinophils Relative: 1.9 % (ref 0.0–5.0)
Glucose, Bld: 107 mg/dL — ABNORMAL HIGH (ref 70–99)
Hemoglobin: 9.5 g/dL — ABNORMAL LOW (ref 12.0–15.0)
Lymphocytes Relative: 25.7 % (ref 12.0–46.0)
MCHC: 33.1 g/dL (ref 30.0–36.0)
Monocytes Relative: 7.1 % (ref 3.0–12.0)
Neutro Abs: 3.2 10*3/uL (ref 1.4–7.7)
RBC: 2.93 M/uL — ABNORMAL LOW (ref 3.87–5.11)
WBC: 5.1 10*3/uL (ref 4.5–10.5)

## 2008-04-04 ENCOUNTER — Encounter: Payer: Self-pay | Admitting: Internal Medicine

## 2008-04-05 ENCOUNTER — Ambulatory Visit: Payer: Self-pay | Admitting: Internal Medicine

## 2008-04-05 LAB — CONVERTED CEMR LAB
ALT: 18 units/L (ref 0–35)
AST: 28 units/L (ref 0–37)
Alkaline Phosphatase: 35 units/L — ABNORMAL LOW (ref 39–117)
Basophils Absolute: 0.1 10*3/uL (ref 0.0–0.1)
Basophils Relative: 0.9 % (ref 0.0–1.0)
CO2: 27 meq/L (ref 19–32)
Chloride: 110 meq/L (ref 96–112)
Eosinophils Absolute: 0.1 10*3/uL (ref 0.0–0.7)
Eosinophils Relative: 1.2 % (ref 0.0–5.0)
GFR calc non Af Amer: 40 mL/min
Lymphocytes Relative: 22.3 % (ref 12.0–46.0)
MCV: 98.9 fL (ref 78.0–100.0)
Neutrophils Relative %: 69.5 % (ref 43.0–77.0)
Platelets: 196 10*3/uL (ref 150–400)
Potassium: 4.6 meq/L (ref 3.5–5.1)
RBC: 2.99 M/uL — ABNORMAL LOW (ref 3.87–5.11)
Total Bilirubin: 0.7 mg/dL (ref 0.3–1.2)
WBC: 5.8 10*3/uL (ref 4.5–10.5)

## 2008-04-07 ENCOUNTER — Encounter: Payer: Self-pay | Admitting: Internal Medicine

## 2008-04-07 ENCOUNTER — Ambulatory Visit: Payer: Self-pay | Admitting: Cardiology

## 2008-05-05 ENCOUNTER — Ambulatory Visit: Payer: Self-pay | Admitting: Cardiovascular Disease

## 2008-05-10 ENCOUNTER — Ambulatory Visit: Payer: Self-pay | Admitting: Internal Medicine

## 2008-05-10 LAB — CONVERTED CEMR LAB
Albumin: 3.9 g/dL (ref 3.5–5.2)
Alkaline Phosphatase: 38 units/L — ABNORMAL LOW (ref 39–117)
BUN: 36 mg/dL — ABNORMAL HIGH (ref 6–23)
Basophils Relative: 0.9 % (ref 0.0–3.0)
Creatinine, Ser: 1.4 mg/dL — ABNORMAL HIGH (ref 0.4–1.2)
Eosinophils Relative: 1.3 % (ref 0.0–5.0)
GFR calc Af Amer: 48 mL/min
GFR calc non Af Amer: 40 mL/min
HCT: 28.6 % — ABNORMAL LOW (ref 36.0–46.0)
Hemoglobin: 9.9 g/dL — ABNORMAL LOW (ref 12.0–15.0)
MCV: 97.8 fL (ref 78.0–100.0)
Monocytes Absolute: 0.4 10*3/uL (ref 0.1–1.0)
Monocytes Relative: 6.8 % (ref 3.0–12.0)
Neutro Abs: 3.7 10*3/uL (ref 1.4–7.7)
Potassium: 4.9 meq/L (ref 3.5–5.1)
RBC: 2.93 M/uL — ABNORMAL LOW (ref 3.87–5.11)
WBC: 5.8 10*3/uL (ref 4.5–10.5)

## 2008-05-14 ENCOUNTER — Encounter: Payer: Self-pay | Admitting: Internal Medicine

## 2008-05-16 ENCOUNTER — Encounter: Payer: Self-pay | Admitting: Internal Medicine

## 2008-06-02 ENCOUNTER — Ambulatory Visit: Payer: Self-pay | Admitting: Cardiovascular Disease

## 2008-06-14 ENCOUNTER — Ambulatory Visit: Payer: Self-pay | Admitting: Internal Medicine

## 2008-06-15 LAB — CONVERTED CEMR LAB
Basophils Absolute: 0.1 10*3/uL (ref 0.0–0.1)
Basophils Relative: 0.9 % (ref 0.0–3.0)
HCT: 30.4 % — ABNORMAL LOW (ref 36.0–46.0)
Hemoglobin: 10.4 g/dL — ABNORMAL LOW (ref 12.0–15.0)
Iron: 46 ug/dL (ref 42–145)
MCHC: 34 g/dL (ref 30.0–36.0)
MCV: 97.1 fL (ref 78.0–100.0)
Monocytes Absolute: 0.4 10*3/uL (ref 0.1–1.0)
Neutro Abs: 3.5 10*3/uL (ref 1.4–7.7)
RDW: 12.8 % (ref 11.5–14.6)
Vitamin B-12: 555 pg/mL (ref 211–911)

## 2008-06-30 ENCOUNTER — Ambulatory Visit: Payer: Self-pay | Admitting: Internal Medicine

## 2008-07-05 ENCOUNTER — Encounter: Payer: Self-pay | Admitting: Internal Medicine

## 2008-07-12 ENCOUNTER — Ambulatory Visit: Payer: Self-pay | Admitting: Internal Medicine

## 2008-07-12 LAB — CONVERTED CEMR LAB
AST: 31 units/L (ref 0–37)
Albumin: 3.9 g/dL (ref 3.5–5.2)
BUN: 34 mg/dL — ABNORMAL HIGH (ref 6–23)
Basophils Absolute: 0 10*3/uL (ref 0.0–0.1)
Calcium: 9.5 mg/dL (ref 8.4–10.5)
Creatinine, Ser: 1.4 mg/dL — ABNORMAL HIGH (ref 0.4–1.2)
GFR calc Af Amer: 48 mL/min
GFR calc non Af Amer: 40 mL/min
Hemoglobin: 10 g/dL — ABNORMAL LOW (ref 12.0–15.0)
Lymphocytes Relative: 27.4 % (ref 12.0–46.0)
MCHC: 33.3 g/dL (ref 30.0–36.0)
Monocytes Relative: 6.1 % (ref 3.0–12.0)
Neutrophils Relative %: 64.3 % (ref 43.0–77.0)
Platelets: 197 10*3/uL (ref 150–400)
RDW: 13.2 % (ref 11.5–14.6)
Total Bilirubin: 0.6 mg/dL (ref 0.3–1.2)

## 2008-07-13 ENCOUNTER — Encounter: Payer: Self-pay | Admitting: Internal Medicine

## 2008-07-27 ENCOUNTER — Inpatient Hospital Stay (HOSPITAL_COMMUNITY): Admission: EM | Admit: 2008-07-27 | Discharge: 2008-07-30 | Payer: Self-pay | Admitting: Emergency Medicine

## 2008-07-27 ENCOUNTER — Ambulatory Visit: Payer: Self-pay | Admitting: Internal Medicine

## 2008-07-27 ENCOUNTER — Ambulatory Visit: Payer: Self-pay | Admitting: Gastroenterology

## 2008-07-27 ENCOUNTER — Telehealth: Payer: Self-pay | Admitting: Internal Medicine

## 2008-07-28 ENCOUNTER — Encounter: Payer: Self-pay | Admitting: Gastroenterology

## 2008-07-30 ENCOUNTER — Encounter (INDEPENDENT_AMBULATORY_CARE_PROVIDER_SITE_OTHER): Payer: Self-pay | Admitting: *Deleted

## 2008-07-31 ENCOUNTER — Telehealth: Payer: Self-pay | Admitting: Internal Medicine

## 2008-07-31 ENCOUNTER — Encounter: Payer: Self-pay | Admitting: Gastroenterology

## 2008-08-02 ENCOUNTER — Telehealth: Payer: Self-pay | Admitting: Internal Medicine

## 2008-08-03 ENCOUNTER — Inpatient Hospital Stay (HOSPITAL_COMMUNITY): Admission: AD | Admit: 2008-08-03 | Discharge: 2008-08-04 | Payer: Self-pay | Admitting: Internal Medicine

## 2008-08-03 ENCOUNTER — Ambulatory Visit: Payer: Self-pay | Admitting: Internal Medicine

## 2008-08-03 LAB — CONVERTED CEMR LAB
Eosinophils Relative: 0.7 % (ref 0.0–5.0)
HCT: 22.1 % — CL (ref 36.0–46.0)
Hemoglobin: 7.5 g/dL — CL (ref 12.0–15.0)
Monocytes Absolute: 0.4 10*3/uL (ref 0.1–1.0)
Monocytes Relative: 6.5 % (ref 3.0–12.0)
Neutro Abs: 5 10*3/uL (ref 1.4–7.7)
RDW: 15.1 % — ABNORMAL HIGH (ref 11.5–14.6)

## 2008-08-04 ENCOUNTER — Encounter (INDEPENDENT_AMBULATORY_CARE_PROVIDER_SITE_OTHER): Payer: Self-pay | Admitting: *Deleted

## 2008-08-09 ENCOUNTER — Inpatient Hospital Stay (HOSPITAL_COMMUNITY): Admission: RE | Admit: 2008-08-09 | Discharge: 2008-08-10 | Payer: Self-pay | Admitting: Cardiovascular Disease

## 2008-08-09 ENCOUNTER — Ambulatory Visit: Payer: Self-pay | Admitting: Cardiovascular Disease

## 2008-08-10 ENCOUNTER — Encounter (INDEPENDENT_AMBULATORY_CARE_PROVIDER_SITE_OTHER): Payer: Self-pay | Admitting: *Deleted

## 2008-08-11 ENCOUNTER — Ambulatory Visit: Payer: Self-pay | Admitting: Internal Medicine

## 2008-08-11 ENCOUNTER — Telehealth: Payer: Self-pay | Admitting: Internal Medicine

## 2008-08-11 LAB — CONVERTED CEMR LAB
Basophils Absolute: 0 10*3/uL (ref 0.0–0.1)
Basophils Relative: 0.5 % (ref 0.0–3.0)
HCT: 24.3 % — ABNORMAL LOW (ref 36.0–46.0)
MCHC: 33.6 g/dL (ref 30.0–36.0)
MCV: 96.7 fL (ref 78.0–100.0)
Neutro Abs: 5.3 10*3/uL (ref 1.4–7.7)
Platelets: 231 10*3/uL (ref 150–400)
RDW: 16.4 % — ABNORMAL HIGH (ref 11.5–14.6)

## 2008-08-14 ENCOUNTER — Ambulatory Visit: Payer: Self-pay | Admitting: Internal Medicine

## 2008-08-15 ENCOUNTER — Telehealth: Payer: Self-pay | Admitting: Internal Medicine

## 2008-08-17 LAB — CONVERTED CEMR LAB
AST: 30 units/L (ref 0–37)
Albumin: 3.5 g/dL (ref 3.5–5.2)
Alkaline Phosphatase: 33 units/L — ABNORMAL LOW (ref 39–117)
BUN: 29 mg/dL — ABNORMAL HIGH (ref 6–23)
Basophils Relative: 0.2 % (ref 0.0–3.0)
CO2: 26 meq/L (ref 19–32)
Chloride: 110 meq/L (ref 96–112)
Creatinine, Ser: 1.2 mg/dL (ref 0.4–1.2)
Glucose, Bld: 97 mg/dL (ref 70–99)
HCT: 23.9 % — ABNORMAL LOW (ref 36.0–46.0)
Hemoglobin: 8 g/dL — ABNORMAL LOW (ref 12.0–15.0)
Lymphocytes Relative: 19.5 % (ref 12.0–46.0)
MCHC: 33.6 g/dL (ref 30.0–36.0)
Monocytes Relative: 5 % (ref 3.0–12.0)
Neutrophils Relative %: 74.1 % (ref 43.0–77.0)
RBC: 2.44 M/uL — ABNORMAL LOW (ref 3.87–5.11)

## 2008-08-18 ENCOUNTER — Encounter (HOSPITAL_COMMUNITY): Admission: RE | Admit: 2008-08-18 | Discharge: 2008-10-19 | Payer: Self-pay | Admitting: Internal Medicine

## 2008-08-22 DIAGNOSIS — K31819 Angiodysplasia of stomach and duodenum without bleeding: Secondary | ICD-10-CM | POA: Insufficient documentation

## 2008-08-24 ENCOUNTER — Encounter: Payer: Self-pay | Admitting: Internal Medicine

## 2008-08-28 ENCOUNTER — Ambulatory Visit: Payer: Self-pay | Admitting: Internal Medicine

## 2008-08-28 LAB — CONVERTED CEMR LAB
Hemoglobin: 11.1 g/dL — ABNORMAL LOW (ref 12.0–15.0)
MCHC: 34 g/dL (ref 30.0–36.0)
Neutro Abs: 4.3 10*3/uL (ref 1.7–7.7)
Neutrophils Relative %: 75 % (ref 43–77)
Platelets: 168 10*3/uL (ref 150–400)
RBC: 3.36 M/uL — ABNORMAL LOW (ref 3.87–5.11)
RDW: 17.6 % — ABNORMAL HIGH (ref 11.5–15.5)

## 2008-08-30 ENCOUNTER — Telehealth: Payer: Self-pay | Admitting: Internal Medicine

## 2008-08-31 ENCOUNTER — Encounter: Payer: Self-pay | Admitting: Internal Medicine

## 2008-09-11 ENCOUNTER — Ambulatory Visit: Payer: Self-pay | Admitting: Internal Medicine

## 2008-09-12 LAB — CONVERTED CEMR LAB
Albumin: 3.8 g/dL (ref 3.5–5.2)
BUN: 26 mg/dL — ABNORMAL HIGH (ref 6–23)
Basophils Relative: 0.7 % (ref 0.0–3.0)
Calcium: 9.6 mg/dL (ref 8.4–10.5)
Chloride: 107 meq/L (ref 96–112)
Eosinophils Absolute: 0.1 10*3/uL (ref 0.0–0.7)
Eosinophils Relative: 0.9 % (ref 0.0–5.0)
GFR calc Af Amer: 52 mL/min
GFR calc non Af Amer: 43 mL/min
Glucose, Bld: 96 mg/dL (ref 70–99)
HCT: 31.9 % — ABNORMAL LOW (ref 36.0–46.0)
Iron: 60 ug/dL (ref 42–145)
MCV: 97.7 fL (ref 78.0–100.0)
Monocytes Relative: 6.4 % (ref 3.0–12.0)
Neutrophils Relative %: 67.8 % (ref 43.0–77.0)
RBC: 3.27 M/uL — ABNORMAL LOW (ref 3.87–5.11)
Total Protein: 6.7 g/dL (ref 6.0–8.3)
WBC: 6.5 10*3/uL (ref 4.5–10.5)

## 2008-10-09 ENCOUNTER — Ambulatory Visit: Payer: Self-pay | Admitting: Internal Medicine

## 2008-10-09 LAB — CONVERTED CEMR LAB
ALT: 23 units/L (ref 0–35)
Albumin: 3.9 g/dL (ref 3.5–5.2)
Basophils Absolute: 0 10*3/uL (ref 0.0–0.1)
Basophils Relative: 0 % (ref 0.0–3.0)
CO2: 26 meq/L (ref 19–32)
Calcium: 9.3 mg/dL (ref 8.4–10.5)
Chloride: 104 meq/L (ref 96–112)
Creatinine, Ser: 1.4 mg/dL — ABNORMAL HIGH (ref 0.4–1.2)
Eosinophils Absolute: 0.1 10*3/uL (ref 0.0–0.7)
GFR calc Af Amer: 48 mL/min
Lymphocytes Relative: 21.7 % (ref 12.0–46.0)
MCHC: 34.5 g/dL (ref 30.0–36.0)
MCV: 98 fL (ref 78.0–100.0)
Neutro Abs: 4.9 10*3/uL (ref 1.4–7.7)
Neutrophils Relative %: 72.1 % (ref 43.0–77.0)
RBC: 3.17 M/uL — ABNORMAL LOW (ref 3.87–5.11)
RDW: 15.3 % — ABNORMAL HIGH (ref 11.5–14.6)
Sodium: 137 meq/L (ref 135–145)
Total Protein: 6.7 g/dL (ref 6.0–8.3)

## 2008-10-11 ENCOUNTER — Telehealth: Payer: Self-pay | Admitting: Internal Medicine

## 2008-10-11 ENCOUNTER — Telehealth (INDEPENDENT_AMBULATORY_CARE_PROVIDER_SITE_OTHER): Payer: Self-pay | Admitting: *Deleted

## 2008-10-11 ENCOUNTER — Encounter: Payer: Self-pay | Admitting: Internal Medicine

## 2008-10-27 ENCOUNTER — Ambulatory Visit: Payer: Self-pay | Admitting: Internal Medicine

## 2008-11-01 ENCOUNTER — Telehealth: Payer: Self-pay | Admitting: Internal Medicine

## 2008-11-09 ENCOUNTER — Ambulatory Visit: Payer: Self-pay | Admitting: Internal Medicine

## 2008-11-09 LAB — CONVERTED CEMR LAB
Alkaline Phosphatase: 47 units/L (ref 39–117)
Bilirubin, Direct: 0.1 mg/dL (ref 0.0–0.3)
Chloride: 106 meq/L (ref 96–112)
Eosinophils Absolute: 0.1 10*3/uL (ref 0.0–0.7)
Eosinophils Relative: 1.5 % (ref 0.0–5.0)
GFR calc Af Amer: 44 mL/min
GFR calc non Af Amer: 36 mL/min
HCT: 31.4 % — ABNORMAL LOW (ref 36.0–46.0)
Hemoglobin: 10.8 g/dL — ABNORMAL LOW (ref 12.0–15.0)
MCV: 97.7 fL (ref 78.0–100.0)
Monocytes Absolute: 0.4 10*3/uL (ref 0.1–1.0)
Neutro Abs: 5 10*3/uL (ref 1.4–7.7)
Platelets: 180 10*3/uL (ref 150–400)
Potassium: 4.7 meq/L (ref 3.5–5.1)
RDW: 14.3 % (ref 11.5–14.6)
Sodium: 140 meq/L (ref 135–145)

## 2008-11-12 ENCOUNTER — Telehealth: Payer: Self-pay | Admitting: Internal Medicine

## 2008-11-17 ENCOUNTER — Telehealth: Payer: Self-pay | Admitting: Internal Medicine

## 2008-11-17 ENCOUNTER — Ambulatory Visit: Payer: Self-pay | Admitting: Internal Medicine

## 2008-11-18 LAB — CONVERTED CEMR LAB
Basophils Relative: 0.3 % (ref 0.0–3.0)
Eosinophils Absolute: 0.1 10*3/uL (ref 0.0–0.7)
Eosinophils Relative: 1.3 % (ref 0.0–5.0)
Hemoglobin: 10 g/dL — ABNORMAL LOW (ref 12.0–15.0)
Lymphocytes Relative: 30 % (ref 12.0–46.0)
Monocytes Absolute: 0.5 10*3/uL (ref 0.1–1.0)
Monocytes Relative: 7.3 % (ref 3.0–12.0)
Neutro Abs: 3.9 10*3/uL (ref 1.4–7.7)
RDW: 14 % (ref 11.5–14.6)
WBC: 6.5 10*3/uL (ref 4.5–10.5)

## 2008-11-20 ENCOUNTER — Encounter: Payer: Self-pay | Admitting: Internal Medicine

## 2008-11-20 ENCOUNTER — Ambulatory Visit: Payer: Self-pay | Admitting: Internal Medicine

## 2008-11-20 LAB — CONVERTED CEMR LAB
Basophils Relative: 0.8 % (ref 0.0–3.0)
Eosinophils Absolute: 0.1 10*3/uL (ref 0.0–0.7)
Lymphocytes Relative: 22.9 % (ref 12.0–46.0)
MCHC: 34 g/dL (ref 30.0–36.0)
MCV: 98.5 fL (ref 78.0–100.0)
Neutro Abs: 4.4 10*3/uL (ref 1.4–7.7)
Neutrophils Relative %: 68.4 % (ref 43.0–77.0)
Platelets: 213 10*3/uL (ref 150–400)
RBC: 3.12 M/uL — ABNORMAL LOW (ref 3.87–5.11)
RDW: 13.5 % (ref 11.5–14.6)

## 2008-12-11 ENCOUNTER — Encounter: Payer: Self-pay | Admitting: Internal Medicine

## 2008-12-12 ENCOUNTER — Encounter: Payer: Self-pay | Admitting: Internal Medicine

## 2008-12-21 ENCOUNTER — Telehealth: Payer: Self-pay | Admitting: Internal Medicine

## 2008-12-21 ENCOUNTER — Ambulatory Visit: Payer: Self-pay | Admitting: Internal Medicine

## 2008-12-21 DIAGNOSIS — N390 Urinary tract infection, site not specified: Secondary | ICD-10-CM | POA: Insufficient documentation

## 2008-12-21 LAB — CONVERTED CEMR LAB
Crystals: NEGATIVE
Mucus, UA: NEGATIVE
Nitrite: POSITIVE — AB
Specific Gravity, Urine: <=1.005 (ref 1.000–1.035)
pH: 6 (ref 5.0–8.0)

## 2008-12-25 ENCOUNTER — Telehealth: Payer: Self-pay | Admitting: Internal Medicine

## 2009-01-10 ENCOUNTER — Ambulatory Visit: Payer: Self-pay | Admitting: Internal Medicine

## 2009-01-10 LAB — CONVERTED CEMR LAB
ALT: 21 units/L (ref 0–35)
AST: 29 units/L (ref 0–37)
Albumin: 4 g/dL (ref 3.5–5.2)

## 2009-01-14 ENCOUNTER — Telehealth: Payer: Self-pay | Admitting: Internal Medicine

## 2009-01-23 ENCOUNTER — Telehealth: Payer: Self-pay | Admitting: Internal Medicine

## 2009-01-31 ENCOUNTER — Ambulatory Visit: Payer: Self-pay | Admitting: Internal Medicine

## 2009-01-31 LAB — CONVERTED CEMR LAB
Basophils Absolute: 0 10*3/uL (ref 0.0–0.1)
Bilirubin, Direct: 0.1 mg/dL (ref 0.0–0.3)
Eosinophils Absolute: 0.1 10*3/uL (ref 0.0–0.7)
Hemoglobin: 10.8 g/dL — ABNORMAL LOW (ref 12.0–15.0)
Lymphocytes Relative: 22.9 % (ref 12.0–46.0)
Lymphs Abs: 1.2 10*3/uL (ref 0.7–4.0)
MCHC: 34.6 g/dL (ref 30.0–36.0)
MCV: 100.1 fL — ABNORMAL HIGH (ref 78.0–100.0)
Monocytes Absolute: 0.3 10*3/uL (ref 0.1–1.0)
Neutro Abs: 3.7 10*3/uL (ref 1.4–7.7)
RBC: 3.13 M/uL — ABNORMAL LOW (ref 3.87–5.11)
Total Bilirubin: 0.6 mg/dL (ref 0.3–1.2)
Total Protein: 7.2 g/dL (ref 6.0–8.3)

## 2009-02-11 ENCOUNTER — Encounter: Payer: Self-pay | Admitting: Internal Medicine

## 2009-03-07 ENCOUNTER — Ambulatory Visit: Payer: Self-pay | Admitting: Internal Medicine

## 2009-03-07 LAB — CONVERTED CEMR LAB
Alkaline Phosphatase: 51 units/L (ref 39–117)
Basophils Absolute: 0 10*3/uL (ref 0.0–0.1)
Bilirubin, Direct: 0.1 mg/dL (ref 0.0–0.3)
Eosinophils Absolute: 0.1 10*3/uL (ref 0.0–0.7)
HCT: 32.3 % — ABNORMAL LOW (ref 36.0–46.0)
Hemoglobin: 10.9 g/dL — ABNORMAL LOW (ref 12.0–15.0)
Lymphocytes Relative: 25.1 % (ref 12.0–46.0)
Lymphs Abs: 1.2 10*3/uL (ref 0.7–4.0)
MCHC: 33.8 g/dL (ref 30.0–36.0)
Neutro Abs: 3.2 10*3/uL (ref 1.4–7.7)
RBC: 3.24 M/uL — ABNORMAL LOW (ref 3.87–5.11)
RDW: 12 % (ref 11.5–14.6)

## 2009-03-09 ENCOUNTER — Encounter: Payer: Self-pay | Admitting: Internal Medicine

## 2009-03-27 ENCOUNTER — Ambulatory Visit: Payer: Self-pay | Admitting: Internal Medicine

## 2009-03-27 DIAGNOSIS — R159 Full incontinence of feces: Secondary | ICD-10-CM | POA: Insufficient documentation

## 2009-03-28 LAB — CONVERTED CEMR LAB
Basophils Relative: 0.7 % (ref 0.0–3.0)
Eosinophils Relative: 1.1 % (ref 0.0–5.0)
HCT: 31 % — ABNORMAL LOW (ref 36.0–46.0)
Iron: 69 ug/dL (ref 42–145)
Lymphs Abs: 1.2 10*3/uL (ref 0.7–4.0)
MCV: 99.3 fL (ref 78.0–100.0)
Monocytes Absolute: 0.4 10*3/uL (ref 0.1–1.0)
Neutro Abs: 3.6 10*3/uL (ref 1.4–7.7)
Platelets: 172 10*3/uL (ref 150.0–400.0)

## 2009-04-18 ENCOUNTER — Encounter: Payer: Self-pay | Admitting: Internal Medicine

## 2009-04-20 ENCOUNTER — Ambulatory Visit: Payer: Self-pay | Admitting: Internal Medicine

## 2009-04-20 LAB — CONVERTED CEMR LAB
ALT: 27 units/L (ref 0–35)
AST: 37 units/L (ref 0–37)
Albumin: 3.8 g/dL (ref 3.5–5.2)
Basophils Absolute: 0 10*3/uL (ref 0.0–0.1)
Bilirubin, Direct: 0.1 mg/dL (ref 0.0–0.3)
Eosinophils Relative: 1.5 % (ref 0.0–5.0)
Monocytes Relative: 5.7 % (ref 3.0–12.0)
Neutrophils Relative %: 68.9 % (ref 43.0–77.0)
Platelets: 163 10*3/uL (ref 150.0–400.0)
RDW: 12.1 % (ref 11.5–14.6)
Total Bilirubin: 0.8 mg/dL (ref 0.3–1.2)
WBC: 4.9 10*3/uL (ref 4.5–10.5)

## 2009-05-02 ENCOUNTER — Ambulatory Visit: Payer: Self-pay | Admitting: Internal Medicine

## 2009-05-09 ENCOUNTER — Telehealth: Payer: Self-pay | Admitting: Internal Medicine

## 2009-05-11 ENCOUNTER — Encounter: Payer: Self-pay | Admitting: Internal Medicine

## 2009-05-23 ENCOUNTER — Ambulatory Visit: Payer: Self-pay | Admitting: Internal Medicine

## 2009-05-23 LAB — CONVERTED CEMR LAB
Albumin: 3.9 g/dL (ref 3.5–5.2)
Basophils Relative: 0.9 % (ref 0.0–3.0)
Bilirubin, Direct: 0.1 mg/dL (ref 0.0–0.3)
Hemoglobin: 11 g/dL — ABNORMAL LOW (ref 12.0–15.0)
Lymphocytes Relative: 23.9 % (ref 12.0–46.0)
MCHC: 33.4 g/dL (ref 30.0–36.0)
Monocytes Relative: 6.8 % (ref 3.0–12.0)
Neutro Abs: 3.7 10*3/uL (ref 1.4–7.7)
RBC: 3.28 M/uL — ABNORMAL LOW (ref 3.87–5.11)
Total Protein: 7.2 g/dL (ref 6.0–8.3)

## 2009-05-31 ENCOUNTER — Ambulatory Visit: Payer: Self-pay | Admitting: Internal Medicine

## 2009-06-28 ENCOUNTER — Telehealth (INDEPENDENT_AMBULATORY_CARE_PROVIDER_SITE_OTHER): Payer: Self-pay | Admitting: *Deleted

## 2009-07-02 ENCOUNTER — Encounter: Payer: Self-pay | Admitting: Internal Medicine

## 2009-07-11 ENCOUNTER — Encounter: Payer: Self-pay | Admitting: Internal Medicine

## 2009-08-01 ENCOUNTER — Ambulatory Visit: Payer: Self-pay | Admitting: Internal Medicine

## 2009-08-01 LAB — CONVERTED CEMR LAB
AST: 35 units/L (ref 0–37)
Albumin: 3.9 g/dL (ref 3.5–5.2)
Alkaline Phosphatase: 48 units/L (ref 39–117)
Basophils Absolute: 0 10*3/uL (ref 0.0–0.1)
Lymphocytes Relative: 24.6 % (ref 12.0–46.0)
Monocytes Relative: 5.5 % (ref 3.0–12.0)
Platelets: 174 10*3/uL (ref 150.0–400.0)
RDW: 12.2 % (ref 11.5–14.6)
Total Protein: 6.9 g/dL (ref 6.0–8.3)

## 2009-09-05 ENCOUNTER — Ambulatory Visit: Payer: Self-pay | Admitting: Internal Medicine

## 2009-09-17 ENCOUNTER — Telehealth: Payer: Self-pay | Admitting: Internal Medicine

## 2009-09-17 LAB — CONVERTED CEMR LAB
ALT: 27 units/L (ref 0–35)
AST: 30 units/L (ref 0–37)
Eosinophils Relative: 1.1 % (ref 0.0–5.0)
HCT: 32.4 % — ABNORMAL LOW (ref 36.0–46.0)
Hemoglobin: 10.8 g/dL — ABNORMAL LOW (ref 12.0–15.0)
Lymphs Abs: 1.4 10*3/uL (ref 0.7–4.0)
Monocytes Relative: 5.6 % (ref 3.0–12.0)
Neutro Abs: 4.5 10*3/uL (ref 1.4–7.7)
Platelets: 187 10*3/uL (ref 150.0–400.0)
Total Bilirubin: 0.7 mg/dL (ref 0.3–1.2)
WBC: 6.4 10*3/uL (ref 4.5–10.5)

## 2009-09-29 ENCOUNTER — Ambulatory Visit: Payer: Self-pay | Admitting: Family Medicine

## 2009-10-01 ENCOUNTER — Ambulatory Visit: Payer: Self-pay | Admitting: Internal Medicine

## 2009-10-02 ENCOUNTER — Encounter: Payer: Self-pay | Admitting: Internal Medicine

## 2009-10-03 ENCOUNTER — Ambulatory Visit: Payer: Self-pay | Admitting: Internal Medicine

## 2009-10-03 LAB — CONVERTED CEMR LAB
Basophils Absolute: 0.1 10*3/uL (ref 0.0–0.1)
Bilirubin, Direct: 0.1 mg/dL (ref 0.0–0.3)
Eosinophils Absolute: 0.1 10*3/uL (ref 0.0–0.7)
Eosinophils Relative: 0.7 % (ref 0.0–5.0)
MCHC: 33.5 g/dL (ref 30.0–36.0)
MCV: 102.2 fL — ABNORMAL HIGH (ref 78.0–100.0)
Monocytes Absolute: 0.4 10*3/uL (ref 0.1–1.0)
Neutrophils Relative %: 65.3 % (ref 43.0–77.0)
Platelets: 200 10*3/uL (ref 150.0–400.0)
RDW: 12.2 % (ref 11.5–14.6)
Total Bilirubin: 0.7 mg/dL (ref 0.3–1.2)
WBC: 7.7 10*3/uL (ref 4.5–10.5)

## 2009-10-04 ENCOUNTER — Encounter: Payer: Self-pay | Admitting: Internal Medicine

## 2009-11-05 ENCOUNTER — Ambulatory Visit: Payer: Self-pay | Admitting: Internal Medicine

## 2009-11-05 LAB — CONVERTED CEMR LAB
ALT: 24 units/L (ref 0–35)
AST: 32 units/L (ref 0–37)
Albumin: 3.9 g/dL (ref 3.5–5.2)
Alkaline Phosphatase: 40 units/L (ref 39–117)
Bilirubin, Direct: 0.1 mg/dL (ref 0.0–0.3)
Total Bilirubin: 0.8 mg/dL (ref 0.3–1.2)
Total Protein: 7 g/dL (ref 6.0–8.3)

## 2009-11-06 LAB — CONVERTED CEMR LAB
Basophils Relative: 0.8 % (ref 0.0–3.0)
Eosinophils Absolute: 0.1 10*3/uL (ref 0.0–0.7)
Eosinophils Relative: 2 % (ref 0.0–5.0)
HCT: 32.8 % — ABNORMAL LOW (ref 36.0–46.0)
Hemoglobin: 10.8 g/dL — ABNORMAL LOW (ref 12.0–15.0)
MCHC: 32.8 g/dL (ref 30.0–36.0)
MCV: 102.5 fL — ABNORMAL HIGH (ref 78.0–100.0)
Monocytes Absolute: 0.3 10*3/uL (ref 0.1–1.0)
Neutro Abs: 3.1 10*3/uL (ref 1.4–7.7)
RBC: 3.2 M/uL — ABNORMAL LOW (ref 3.87–5.11)

## 2009-11-08 ENCOUNTER — Telehealth: Payer: Self-pay | Admitting: Internal Medicine

## 2009-12-05 ENCOUNTER — Ambulatory Visit: Payer: Self-pay | Admitting: Internal Medicine

## 2009-12-05 LAB — CONVERTED CEMR LAB
Albumin: 3.9 g/dL (ref 3.5–5.2)
Alkaline Phosphatase: 45 units/L (ref 39–117)
Basophils Absolute: 0.1 10*3/uL (ref 0.0–0.1)
Basophils Relative: 0.9 % (ref 0.0–3.0)
Bilirubin, Direct: 0.2 mg/dL (ref 0.0–0.3)
HCT: 32 % — ABNORMAL LOW (ref 36.0–46.0)
Hemoglobin: 10.6 g/dL — ABNORMAL LOW (ref 12.0–15.0)
Lymphocytes Relative: 27.1 % (ref 12.0–46.0)
Lymphs Abs: 1.5 10*3/uL (ref 0.7–4.0)
MCHC: 33.3 g/dL (ref 30.0–36.0)
Monocytes Relative: 6.9 % (ref 3.0–12.0)
Neutro Abs: 3.6 10*3/uL (ref 1.4–7.7)
RBC: 3.17 M/uL — ABNORMAL LOW (ref 3.87–5.11)
RDW: 12.5 % (ref 11.5–14.6)

## 2009-12-13 ENCOUNTER — Telehealth: Payer: Self-pay | Admitting: Internal Medicine

## 2009-12-31 ENCOUNTER — Encounter: Payer: Self-pay | Admitting: Cardiovascular Disease

## 2010-01-01 ENCOUNTER — Telehealth: Payer: Self-pay | Admitting: Internal Medicine

## 2010-01-01 ENCOUNTER — Ambulatory Visit: Payer: Self-pay | Admitting: Internal Medicine

## 2010-01-01 LAB — CONVERTED CEMR LAB
AST: 31 units/L (ref 0–37)
Albumin: 3.9 g/dL (ref 3.5–5.2)
Basophils Absolute: 0 10*3/uL (ref 0.0–0.1)
Eosinophils Absolute: 0.1 10*3/uL (ref 0.0–0.7)
HCT: 32.8 % — ABNORMAL LOW (ref 36.0–46.0)
Hemoglobin: 10.8 g/dL — ABNORMAL LOW (ref 12.0–15.0)
Lymphs Abs: 1.4 10*3/uL (ref 0.7–4.0)
MCHC: 33 g/dL (ref 30.0–36.0)
Monocytes Absolute: 0.4 10*3/uL (ref 0.1–1.0)
Monocytes Relative: 6.3 % (ref 3.0–12.0)
Neutro Abs: 3.7 10*3/uL (ref 1.4–7.7)
Platelets: 178 10*3/uL (ref 150.0–400.0)
RDW: 12.3 % (ref 11.5–14.6)
Total Bilirubin: 0.5 mg/dL (ref 0.3–1.2)

## 2010-01-09 ENCOUNTER — Telehealth: Payer: Self-pay | Admitting: Internal Medicine

## 2010-02-06 ENCOUNTER — Ambulatory Visit: Payer: Self-pay | Admitting: Internal Medicine

## 2010-02-06 LAB — CONVERTED CEMR LAB
ALT: 21 units/L (ref 0–35)
AST: 30 units/L (ref 0–37)
Basophils Relative: 0.4 % (ref 0.0–3.0)
Eosinophils Relative: 1 % (ref 0.0–5.0)
HCT: 30 % — ABNORMAL LOW (ref 36.0–46.0)
Hemoglobin: 10.1 g/dL — ABNORMAL LOW (ref 12.0–15.0)
Lymphs Abs: 1.6 10*3/uL (ref 0.7–4.0)
MCV: 100 fL (ref 78.0–100.0)
Monocytes Absolute: 0.4 10*3/uL (ref 0.1–1.0)
Monocytes Relative: 5.9 % (ref 3.0–12.0)
Platelets: 193 10*3/uL (ref 150.0–400.0)
RBC: 3 M/uL — ABNORMAL LOW (ref 3.87–5.11)
Total Bilirubin: 0.5 mg/dL (ref 0.3–1.2)
Total Protein: 6.6 g/dL (ref 6.0–8.3)
WBC: 6.4 10*3/uL (ref 4.5–10.5)

## 2010-02-10 ENCOUNTER — Telehealth: Payer: Self-pay | Admitting: Internal Medicine

## 2010-03-08 ENCOUNTER — Ambulatory Visit: Payer: Self-pay | Admitting: Internal Medicine

## 2010-03-08 LAB — CONVERTED CEMR LAB
Alkaline Phosphatase: 46 units/L (ref 39–117)
Bilirubin, Direct: 0 mg/dL (ref 0.0–0.3)
Total Bilirubin: 0.6 mg/dL (ref 0.3–1.2)

## 2010-03-12 LAB — CONVERTED CEMR LAB
Basophils Absolute: 0 K/uL
Basophils Relative: 0.5 %
Eosinophils Absolute: 0.1 K/uL
Eosinophils Relative: 2.3 %
HCT: 30 % — ABNORMAL LOW
Hemoglobin: 10.3 g/dL — ABNORMAL LOW
Lymphocytes Relative: 23.5 %
Lymphs Abs: 1.3 K/uL
MCHC: 34.2 g/dL
MCV: 99.8 fL
Monocytes Absolute: 0.4 K/uL
Monocytes Relative: 6.7 %
Neutro Abs: 3.6 K/uL
Neutrophils Relative %: 67 %
Platelets: 197 K/uL
RBC: 3 M/uL — ABNORMAL LOW
RDW: 13.5 %
WBC: 5.3 10*3/microliter

## 2010-03-19 ENCOUNTER — Ambulatory Visit: Payer: Self-pay | Admitting: Internal Medicine

## 2010-03-28 ENCOUNTER — Ambulatory Visit: Payer: Self-pay | Admitting: Internal Medicine

## 2010-03-30 ENCOUNTER — Encounter: Payer: Self-pay | Admitting: Internal Medicine

## 2010-04-05 ENCOUNTER — Ambulatory Visit: Payer: Self-pay | Admitting: Internal Medicine

## 2010-04-08 ENCOUNTER — Telehealth: Payer: Self-pay | Admitting: Internal Medicine

## 2010-04-18 ENCOUNTER — Encounter: Payer: Self-pay | Admitting: Internal Medicine

## 2010-04-24 ENCOUNTER — Telehealth: Payer: Self-pay | Admitting: Internal Medicine

## 2010-04-30 ENCOUNTER — Encounter: Payer: Self-pay | Admitting: Internal Medicine

## 2010-05-07 ENCOUNTER — Ambulatory Visit: Payer: Self-pay | Admitting: Internal Medicine

## 2010-05-09 ENCOUNTER — Encounter: Payer: Self-pay | Admitting: Internal Medicine

## 2010-05-13 ENCOUNTER — Encounter: Payer: Self-pay | Admitting: Internal Medicine

## 2010-05-14 ENCOUNTER — Telehealth: Payer: Self-pay | Admitting: Internal Medicine

## 2010-05-14 ENCOUNTER — Encounter: Payer: Self-pay | Admitting: Internal Medicine

## 2010-05-14 LAB — CONVERTED CEMR LAB
ALT: 21 units/L (ref 0–35)
ALT: 21 units/L (ref 0–35)
AST: 27 units/L (ref 0–37)
AST: 30 units/L (ref 0–37)
Alkaline Phosphatase: 41 units/L (ref 39–117)
Alkaline Phosphatase: 41 units/L (ref 39–117)
Basophils Absolute: 0 10*3/uL (ref 0.0–0.1)
Basophils Relative: 0.3 % (ref 0.0–3.0)
Bilirubin, Direct: 0.1 mg/dL (ref 0.0–0.3)
Calcium: 9.6 mg/dL (ref 8.4–10.5)
Chloride: 108 meq/L (ref 96–112)
Creatinine, Ser: 1.4 mg/dL — ABNORMAL HIGH (ref 0.4–1.2)
Eosinophils Absolute: 0.1 10*3/uL (ref 0.0–0.7)
Eosinophils Relative: 1.3 % (ref 0.0–5.0)
Lymphocytes Relative: 25.2 % (ref 12.0–46.0)
Lymphocytes Relative: 25.3 % (ref 12.0–46.0)
MCHC: 34.4 g/dL (ref 30.0–36.0)
MCV: 99.6 fL (ref 78.0–100.0)
MCV: 99.8 fL (ref 78.0–100.0)
Monocytes Absolute: 0.3 10*3/uL (ref 0.1–1.0)
Monocytes Relative: 6.9 % (ref 3.0–12.0)
Neutro Abs: 3.8 10*3/uL (ref 1.4–7.7)
Neutrophils Relative %: 66.2 % (ref 43.0–77.0)
Neutrophils Relative %: 67.2 % (ref 43.0–77.0)
Potassium: 5.3 meq/L — ABNORMAL HIGH (ref 3.5–5.1)
RBC: 3.07 M/uL — ABNORMAL LOW (ref 3.87–5.11)
RDW: 13.5 % (ref 11.5–14.6)
Total Protein: 7.5 g/dL (ref 6.0–8.3)
WBC: 5.7 10*3/uL (ref 4.5–10.5)

## 2010-05-20 ENCOUNTER — Encounter: Payer: Self-pay | Admitting: Internal Medicine

## 2010-06-07 ENCOUNTER — Ambulatory Visit: Payer: Self-pay | Admitting: Internal Medicine

## 2010-06-10 LAB — CONVERTED CEMR LAB
AST: 25 units/L (ref 0–37)
Albumin: 3.9 g/dL (ref 3.5–5.2)
BUN: 30 mg/dL — ABNORMAL HIGH (ref 6–23)
Basophils Relative: 0.3 % (ref 0.0–3.0)
Bilirubin, Direct: 0.1 mg/dL (ref 0.0–0.3)
Calcium: 9.2 mg/dL (ref 8.4–10.5)
Chloride: 108 meq/L (ref 96–112)
Creatinine, Ser: 1.3 mg/dL — ABNORMAL HIGH (ref 0.4–1.2)
Eosinophils Relative: 1.2 % (ref 0.0–5.0)
GFR calc non Af Amer: 43.59 mL/min (ref >60)
Glucose, Bld: 91 mg/dL (ref 70–99)
Lymphocytes Relative: 24.9 % (ref 12.0–46.0)
MCV: 100.5 fL — ABNORMAL HIGH (ref 78.0–100.0)
Monocytes Relative: 6.1 % (ref 3.0–12.0)
Neutrophils Relative %: 67.5 % (ref 43.0–77.0)
Platelets: 170 10*3/uL (ref 150.0–400.0)
RBC: 3.11 M/uL — ABNORMAL LOW (ref 3.87–5.11)
WBC: 5.3 10*3/uL (ref 4.5–10.5)

## 2010-07-01 ENCOUNTER — Encounter: Payer: Self-pay | Admitting: Internal Medicine

## 2010-07-01 ENCOUNTER — Encounter: Payer: Self-pay | Admitting: Cardiology

## 2010-07-16 ENCOUNTER — Ambulatory Visit: Payer: Self-pay | Admitting: Internal Medicine

## 2010-07-16 LAB — CONVERTED CEMR LAB
Basophils Absolute: 0 10*3/uL (ref 0.0–0.1)
HCT: 30.1 % — ABNORMAL LOW (ref 36.0–46.0)
Hemoglobin: 10.3 g/dL — ABNORMAL LOW (ref 12.0–15.0)
Lymphs Abs: 1.5 10*3/uL (ref 0.7–4.0)
MCHC: 34.3 g/dL (ref 30.0–36.0)
MCV: 100.8 fL — ABNORMAL HIGH (ref 78.0–100.0)
Monocytes Relative: 5.3 % (ref 3.0–12.0)
Neutrophils Relative %: 73.7 % (ref 43.0–77.0)
RBC: 2.99 M/uL — ABNORMAL LOW (ref 3.87–5.11)
WBC: 7.4 10*3/uL (ref 4.5–10.5)

## 2010-08-07 ENCOUNTER — Ambulatory Visit: Payer: Self-pay | Admitting: Internal Medicine

## 2010-08-07 LAB — CONVERTED CEMR LAB
ALT: 16 units/L (ref 0–35)
AST: 26 units/L (ref 0–37)
Alkaline Phosphatase: 44 units/L (ref 39–117)
Bilirubin, Direct: 0.1 mg/dL (ref 0.0–0.3)
Eosinophils Relative: 1.2 % (ref 0.0–5.0)
HCT: 31.5 % — ABNORMAL LOW (ref 36.0–46.0)
Hemoglobin: 10.7 g/dL — ABNORMAL LOW (ref 12.0–15.0)
Lymphs Abs: 1.3 10*3/uL (ref 0.7–4.0)
Monocytes Relative: 5.6 % (ref 3.0–12.0)
Platelets: 211 10*3/uL (ref 150.0–400.0)
Total Bilirubin: 0.5 mg/dL (ref 0.3–1.2)
WBC: 5.8 10*3/uL (ref 4.5–10.5)

## 2010-08-19 ENCOUNTER — Ambulatory Visit: Payer: Self-pay | Admitting: Internal Medicine

## 2010-09-06 ENCOUNTER — Telehealth: Payer: Self-pay | Admitting: Internal Medicine

## 2010-09-06 ENCOUNTER — Ambulatory Visit: Payer: Self-pay | Admitting: Internal Medicine

## 2010-09-10 LAB — CONVERTED CEMR LAB
Basophils Absolute: 0 10*3/uL (ref 0.0–0.1)
Bilirubin, Direct: 0.1 mg/dL (ref 0.0–0.3)
Eosinophils Absolute: 0.1 10*3/uL (ref 0.0–0.7)
MCHC: 35.4 g/dL (ref 30.0–36.0)
MCV: 99.9 fL (ref 78.0–100.0)
Monocytes Absolute: 0.4 10*3/uL (ref 0.1–1.0)
Neutrophils Relative %: 69.6 % (ref 43.0–77.0)
Platelets: 203 10*3/uL (ref 150.0–400.0)
RDW: 13.7 % (ref 11.5–14.6)
Total Bilirubin: 0.4 mg/dL (ref 0.3–1.2)
WBC: 6.4 10*3/uL (ref 4.5–10.5)

## 2010-10-03 ENCOUNTER — Encounter: Payer: Self-pay | Admitting: Internal Medicine

## 2010-10-10 ENCOUNTER — Ambulatory Visit: Payer: Self-pay | Admitting: Internal Medicine

## 2010-10-10 LAB — CONVERTED CEMR LAB
ALT: 28 units/L (ref 0–35)
Basophils Relative: 0.5 % (ref 0.0–3.0)
Eosinophils Relative: 0.9 % (ref 0.0–5.0)
Hemoglobin: 11 g/dL — ABNORMAL LOW (ref 12.0–15.0)
Lymphocytes Relative: 25.6 % (ref 12.0–46.0)
MCV: 100.2 fL — ABNORMAL HIGH (ref 78.0–100.0)
Monocytes Absolute: 0.3 10*3/uL (ref 0.1–1.0)
Neutro Abs: 3.5 10*3/uL (ref 1.4–7.7)
Neutrophils Relative %: 66.4 % (ref 43.0–77.0)
RBC: 3.19 M/uL — ABNORMAL LOW (ref 3.87–5.11)
Total Bilirubin: 0.5 mg/dL (ref 0.3–1.2)
WBC: 5.3 10*3/uL (ref 4.5–10.5)

## 2010-10-15 ENCOUNTER — Telehealth: Payer: Self-pay | Admitting: Internal Medicine

## 2010-10-28 ENCOUNTER — Encounter: Payer: Self-pay | Admitting: Internal Medicine

## 2010-11-05 ENCOUNTER — Encounter: Payer: Self-pay | Admitting: Internal Medicine

## 2010-11-11 ENCOUNTER — Other Ambulatory Visit: Payer: Self-pay | Admitting: Internal Medicine

## 2010-11-11 ENCOUNTER — Ambulatory Visit
Admission: RE | Admit: 2010-11-11 | Discharge: 2010-11-11 | Payer: Self-pay | Source: Home / Self Care | Attending: Internal Medicine | Admitting: Internal Medicine

## 2010-11-11 LAB — CBC WITH DIFFERENTIAL/PLATELET
Basophils Absolute: 0 10*3/uL (ref 0.0–0.1)
Eosinophils Absolute: 0.1 10*3/uL (ref 0.0–0.7)
Eosinophils Relative: 1.2 % (ref 0.0–5.0)
Hemoglobin: 11.3 g/dL — ABNORMAL LOW (ref 12.0–15.0)
Lymphocytes Relative: 26.4 % (ref 12.0–46.0)
Lymphs Abs: 1.5 10*3/uL (ref 0.7–4.0)
Monocytes Absolute: 0.4 10*3/uL (ref 0.1–1.0)
Monocytes Relative: 7 % (ref 3.0–12.0)
Platelets: 193 10*3/uL (ref 150.0–400.0)
RDW: 13.1 % (ref 11.5–14.6)
WBC: 5.7 10*3/uL (ref 4.5–10.5)

## 2010-11-11 LAB — HEPATIC FUNCTION PANEL
Albumin: 4 g/dL (ref 3.5–5.2)
Alkaline Phosphatase: 56 U/L (ref 39–117)
Bilirubin, Direct: 0.1 mg/dL (ref 0.0–0.3)

## 2010-11-21 NOTE — Progress Notes (Signed)
  Phone Note Outgoing Call   Reason for Call: Discuss lab or test results Summary of Call: please call patient : labs are looking just fine. Copy faxed to Dr. Gaynell Face  Thanks Initial call taken by: Neena Rhymes MD,  April 08, 2010 8:29 AM  Follow-up for Phone Call        left mess to call office back..........Marland KitchenCharlsie Quest, CMA  April 08, 2010 10:22 AM   Additional Follow-up for Phone Call Additional follow up Details #1::        called pt no answer, lmoam for pt to call back Additional Follow-up by: Ami Bullins CMA,  April 08, 2010 11:45 AM    Additional Follow-up for Phone Call Additional follow up Details #2::    informed pt of results Follow-up by: Ami Bullins CMA,  April 08, 2010 2:37 PM

## 2010-11-21 NOTE — Progress Notes (Signed)
  Phone Note Outgoing Call   Reason for Call: Discuss lab or test results Summary of Call: call patinets. Labs ok; hemoglobin stable, liver functions normal. Faxed to Dr. Gaynell Face Initial call taken by: Neena Rhymes MD,  October 15, 2010 9:31 AM  Follow-up for Phone Call        called pt no answer or am. will try back later  Follow-up by: Ami Bullins CMA,  October 15, 2010 2:14 PM  Additional Follow-up for Phone Call Additional follow up Details #1::        lmovm for pt to call back Additional Follow-up by: Ami Bullins CMA,  October 16, 2010 12:30 PM    Additional Follow-up for Phone Call Additional follow up Details #2::    Pt informed  Follow-up by: Charlsie Quest, CMA,  October 18, 2010 4:47 PM

## 2010-11-21 NOTE — Progress Notes (Signed)
Summary: results- HOLD FOR [facility]  Phone Note Call from Patient Call back at Home Phone [phone]   Caller: Patient Summary of Call: Patient called  requesting  results of most recent lab work, per pt she did not receive in the mail Initial call taken by: Estell Harpin CMA,  May 14, 2010 5:21 PM  Follow-up for Phone Call        labs were normal.  A copy of the letter for her labs can be sent.  Follow-up by: Neena Rhymes MD,  May 14, 2010 6:41 PM  Additional Follow-up for Phone Call Additional follow up Details #1::        Pt informed  Additional Follow-up by: Charlsie Quest, CMA,  May 15, 2010 6:09 PM

## 2010-11-21 NOTE — Letter (Signed)
[facility]   Imported By: Zenovia Jarred 02/11/2010 11:27:11  _____________________________________________________________________  External Attachment:    Type:   Image     Comment:   External Document

## 2010-11-21 NOTE — Progress Notes (Signed)
Summary: lab results  Phone Note Call from Patient Call back at Home Phone [phone]   Summary of Call: Patient left message on triage requesting her most recent Hg and lab results. Please advise. Initial call taken by: Ernestene Mention,  December 13, 2009 12:01 PM  Follow-up for Phone Call        Hgb 10.6 and stable. LFTs normal. Copy to Dr. Gaynell Face. Follow-up by: Neena Rhymes MD,  December 13, 2009 1:43 PM  Additional Follow-up for Phone Call Additional follow up Details #1::        Pt informed  Additional Follow-up by: Charlsie Quest, CMA,  December 13, 2009 2:37 PM

## 2010-11-21 NOTE — Assessment & Plan Note (Signed)
Summary: LOWER BACK PAIN/ BOWEL PROBLEMS /NWS   Vital Signs:  Patient profile:   75 year old female Weight:      142 pounds Temp:     97.4 degrees F oral Pulse rate:   79 / minute BP sitting:   118 / 48  (left arm) Cuff size:   regular  Vitals Entered By: Charlsie Quest, CMA (August 19, 2010 9:55 AM) CC: Back pain, ? constipation x 3 wks./ SD   Primary Care Provider:  Adella Hare, MD  CC:  Back pain and ? constipation x 3 wks./ SD.  History of Present Illness: Has had a problem with bowel movements over the past 2-3 weeks with a change of stool to small particles (rabbit size) along with severe low back pain. Her pain is relieved with flatus or BM. She has stopped taking iron and has not taken any other medication for her bowels. No fever, chills. No recollection of strain or discomfort.  She has used a glycerin suppository.   Current Medications (verified): 1)  Spironolactone 50 Mg Tabs (Spironolactone) .... Take 1 Tablet By Mouth Once A Day 2)  Tracleer 125 Mg  Tabs (Bosentan) .... Take One Tablet Twice Daily 3)  Torsemide 20 Mg  Tabs (Torsemide) .... Take As Needed 4)  Xanax 0.25 Mg Tabs (Alprazolam) .... Take 1/2 Tablet By Mouth As Needed 5)  Fish Oil 1200 Mg  Caps (Omega-3 Fatty Acids) .... Take 1 Tablet By Mouth Once A Day 6)  Diltiazem Hcl Er Beads 120 Mg Xr24h-Cap (Diltiazem Hcl Er Beads) .... Take 1 Tablet By Mouth Once A Day 7)  Fluticasone Propionate 50 Mcg/act Susp (Fluticasone Propionate) .... Take As Needed 8)  Afrin Nasal Spray 0.05 % Soln (Oxymetazoline Hcl) .... Use As Needed 9)  "liquid Iron" .... Take 2 Tablespoons By Mouth Once Daily 10)  Anusol-Hc 25 Mg Supp (Hydrocortisone Acetate) .... Insert 1 Suppository Into Rectum At Bedtime 11)  Benzonatate 100 Mg Caps (Benzonatate) .Marland Kitchen.. 1 By Mouth Three Times A Day For Cough 12)  Vitamin D 1000 Unit  Tabs (Cholecalciferol) .... One Tablet By Mouth Once Daily 13)  Caltrate 600+d Plus 600-400 Mg-Unit Tabs (Calcium  Carbonate-Vit D-Min) .... One Tablet By Mouth Once Daily 14)  Prilosec 40 Mg  Cpdr (Omeprazole) .Marland Kitchen.. 1 Twice A Day 30 Minutes Before Meals  Allergies (verified): 1)  ! * Mycins? 2)  ! 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) ANEMIA (ICD-285.9) GASTROESOPHAGEAL REFLUX DISEASE (ICD-530.81) HYPERTENSION (ICD-401.9) PULMONARY EMBOLISM, HX OF (ICD-V12.51) 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    Physical Exam  General:  WNWD white woman in no distress Head:  normocephalic and atraumatic.   Lungs:  normal respiratory effort.  Chronic crackles and wheeze at bases Heart:  normal rate and regular rhythm.   Abdomen:  soft, non-tender, normal bowel sounds, no distention, no masses, no guarding, and no rigidity.   Msk:  normal ROM, no joint tenderness, and no joint deformities.   Pulses:  2+ radial Neurologic:  alert & oriented X3, cranial nerves II-XII intact, and gait normal.   Skin:  turgor normal,  color normal, and no suspicious lesions.   Psych:  Oriented X3, memory intact for recent and remote, and normally interactive.     Impression & Recommendations:  Problem # 1:  OTHER SYMPTOMS INVOLVING DIGESTIVE SYSTEM OTHER (ICD-787.99) Change in bowel habit with small hard stools and back pain that is associated with defecation. Exam is normal.  Plan - KUB           trial of MOM 30cc two times a day x 2 days or until results, then low dose daily until bowel habit is regular.  Orders: T-Abdomen 2-view (74020TC) Addendum - KUB normal except for stool in colon c/w constipation.  Complete Medication List: 1)  Spironolactone 50 Mg Tabs (Spironolactone) .... Take 1 tablet by mouth once a day 2)  Tracleer 125 Mg Tabs (Bosentan) .... Take one tablet twice daily 3)  Torsemide 20 Mg Tabs (Torsemide) .... Take as needed 4)  Xanax 0.25 Mg Tabs (Alprazolam) .... Take 1/2 tablet by mouth as needed 5)  Fish Oil 1200 Mg Caps (Omega-3 fatty acids) .... Take 1 tablet by mouth once a day 6)  Diltiazem Hcl Er Beads 120 Mg Xr24h-cap (Diltiazem hcl er beads) .... Take 1 tablet by mouth once a day 7)  Fluticasone Propionate 50 Mcg/act Susp (Fluticasone propionate) .... Take as needed 8)  Afrin Nasal Spray 0.05 % Soln (Oxymetazoline hcl) .... Use as needed 9)  "liquid Iron"  .... Take 2 tablespoons by mouth once daily 10)  Anusol-hc 25 Mg Supp (Hydrocortisone acetate) .... Insert 1 suppository into rectum at bedtime 11)  Benzonatate 100 Mg Caps (Benzonatate) .Marland Kitchen.. 1 by mouth three times a day for cough 12)  Vitamin D 1000 Unit Tabs (Cholecalciferol) .... One tablet by mouth once daily 13)  Caltrate 600+d Plus 600-400 Mg-unit Tabs (Calcium carbonate-vit d-min) .... One tablet by mouth once daily 14)  Prilosec 40 Mg Cpdr (Omeprazole) .Marland Kitchen.. 1 twice a day 30 minutes before meals   Orders Added: 1)  T-Abdomen 2-view [74020TC] 2)  Est. Patient Level III [16861]

## 2010-11-21 NOTE — Letter (Signed)
[facility] [place], [place]  [address] Phone: [phone]      May 14, 2010   [facility] [address] [place], [place] [address]  RE:  LAB RESULTS  Dear  Ms. Vanderheyden,  The following is an interpretation of your most recent lab tests.  Please take note of any instructions provided or changes to medications that have resulted from your lab work.  ELECTROLYTES:  Good - no changes needed  KIDNEY FUNCTION TESTS:  Good - no changes needed    CBC:  Good - no changes needed   New letter with the correct labs. I apologize for the cut and paste error.    Sincerely Yours,    Neena Rhymes MD  Patient: Paula Ward Note: All result statuses are Final unless otherwise noted.  Tests: (1) CBC Platelet w/Diff (CBCD)   White Cell Count          5.7 K/uL                    4.5-10.5   Red Cell Count       [L]  3.19 Mil/uL                 3.87-5.11   Hemoglobin           [L]  10.9 g/dL                   12.0-15.0   Hematocrit           [L]  31.8 %                      36.0-46.0   MCV                       99.6 fl                     78.0-100.0   MCHC                      34.4 g/dL                   30.0-36.0   RDW                       13.5 %                      11.5-14.6   Platelet Count            192.0 K/uL                  150.0-400.0   Neutrophil %              67.2 %                      43.0-77.0   Lymphocyte %              25.2 %                      12.0-46.0   Monocyte %                6.0 %                       3.0-12.0   Eosinophils%  1.1 %                       0.0-5.0   Basophils %               0.5 %                       0.0-3.0   Neutrophill Absolute      3.8 K/uL                    1.4-7.7   Lymphocyte Absolute       1.4 K/uL                    0.7-4.0   Monocyte Absolute         0.3 K/uL                    0.1-1.0  Eosinophils, Absolute                             0.1 K/uL                    0.0-0.7  Basophils Absolute        0.0 K/uL                    0.0-0.1  Tests: (2) CMP (COMP)   Sodium                    138 mEq/L                   135-145   Potassium            [H]  5.3 mEq/L                   3.5-5.1   Chloride                  108 mEq/L                   96-112   Carbon Dioxide            25 mEq/L                    19-32   Glucose                   87 mg/dL                    70-99   BUN                  [H]  35 mg/dL                    6-23   Creatinine           [H]  1.4 mg/dL                   0.4-1.2   Total Bilirubin           0.6 mg/dL                   0.3-1.2   Alkaline Phosphatase      41 U/L  39-117   AST                       30 U/L                      0-37   ALT                       21 U/L                      0-35   Total Protein             7.3 g/dL                    6.0-8.3   Albumin                   4.4 g/dL                    3.5-5.2   Calcium                   9.6 mg/dL                   8.4-10.5   GFR                       40.31 mL/min                >60

## 2010-11-21 NOTE — Letter (Signed)
[facility]   Im[facility] By: Rise Patience 07/19/2010 14:16:44  _____________________________________________________________________  External Attachment:    Type:   Image     Comment:   External Document

## 2010-11-21 NOTE — Assessment & Plan Note (Signed)
Summary: GERD                   Paula Ward    History of Present Illness Visit Type: Follow-up Visit Primary GI MD: Delfin Edis MD Primary Provider: Adella Hare, MD Requesting Provider: na Chief Complaint: GERD. 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 Her hemoglobin has recently been stable at around 10.3. She is on natural liquid  oral iron supplements. She developed substernal burning and chest pain recently and called our office. She has been on omeprazole 20 mg twice a day. Her last upper endoscopy in October 2009 showed Barrett's esophagus. Her Paternal uncle had esophageal cancer. She has AVMs in her upper as well as lower GI tract which were cauterized. Patient is being treated for pulmonary hypertension at [facility]. Her last colonoscopy in July 2010 showed AVMs and mild diverticulosis.   GI Review of Systems    Reports acid reflux and  heartburn.      Denies abdominal pain, belching, bloating, chest pain, dysphagia with liquids, dysphagia with solids, loss of appetite, nausea, vomiting, vomiting blood, weight loss, and  weight gain.        Denies anal fissure, black tarry stools, change in bowel habit, constipation, diarrhea, diverticulosis, fecal incontinence, heme positive stool, hemorrhoids, irritable bowel syndrome, jaundice, light color stool, liver problems, rectal bleeding, and  rectal pain.    Current Medications (verified): 1)  Spironolactone 50 Mg Tabs (Spironolactone) .... Take 1 Tablet By Mouth Once A Day 2)  Omeprazole 20 Mg  Tbec (Omeprazole) .... Take 1 Tablet By Mouth Two Times A Day. 3)  Tracleer 125 Mg  Tabs (Bosentan) .... Take One Tablet Twice Daily 4)  Torsemide 20 Mg  Tabs (Torsemide) .... Take As Needed 5)  Xanax 0.25 Mg Tabs (Alprazolam) .... Take 1/2 Tablet By Mouth As Needed 6)  Fish Oil 1200 Mg  Caps  (Omega-3 Fatty Acids) .... Take 1 Tablet By Mouth Once A Day 7)  Diltiazem Hcl Er Beads 120 Mg Xr24h-Cap (Diltiazem Hcl Er Beads) .... Take 1 Tablet By Mouth Once A Day 8)  Fluticasone Propionate 50 Mcg/act Susp (Fluticasone Propionate) .... Take As Needed 9)  Afrin Nasal Spray 0.05 % Soln (Oxymetazoline Hcl) .... Use As Needed 10)  "liquid Iron" .... Take 2 Tablespoons By Mouth Once Daily 11)  Anusol-Hc 25 Mg Supp (Hydrocortisone Acetate) .... Insert 1 Suppository Into Rectum At Bedtime 12)  Benzonatate 100 Mg Caps (Benzonatate) .Marland Kitchen.. 1 By Mouth Three Times A Day For Cough 13)  Vitamin D 1000 Unit  Tabs (Cholecalciferol) .... One Tablet By Mouth Once Daily 14)  Caltrate 600+d Plus 600-400 Mg-Unit Tabs (Calcium Carbonate-Vit D-Min) .... One Tablet By Mouth Once Daily  Allergies (verified): 1)  ! * Mycins? 2)  ! Codeine  Past History:  Past Medical History: Reviewed history from 08/22/2008 and no changes required. 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 MASTECTOMY, LEFT,  HX OF (ICD-V45.71) LUMPECTOMY, BREAST, HX OF (ICD-V15.2) APPENDECTOMY, HX OF (ICD-V45.79) TONSILLECTOMY, HX OF (ICD-V45.79)     Family History: Reviewed history from 03/27/2009 and no changes required. father- deceased _0 ; bladder cancer, DM mother - deceased _1 ; alzheimer's, Neg-  breast colon cancer, CAD/MI Sister- lung cancer ( smoker) No FH of Colon Cancer: Family History of Prostate Cancer: Father Family History of Diabetes: Sister  Social History: Reviewed history from 03/27/2009 and no changes required. married '61 1 son-'65, 1 daughter '63; 6 children ( 2 adopted) SO- SOB retirement - doing well. marriage in good health. Patient has never smoked.  Alcohol Use - no Illicit Drug Use - no Patient gets regular exercise.  Review of Systems       The patient complains of arthritis/joint pain.  The patient denies allergy/sinus, anemia, anxiety-new, back pain, blood in urine, breast changes/lumps, change in vision, confusion, cough, coughing up blood, depression-new, fainting, fatigue, fever, headaches-new, hearing problems, heart murmur, heart rhythm changes, itching, menstrual pain, muscle pains/cramps, night sweats, nosebleeds, pregnancy symptoms, shortness of breath, skin rash, sleeping problems, sore throat, swelling of feet/legs, swollen lymph glands, thirst - excessive , urination - excessive , urination changes/pain, urine leakage, vision changes, and voice change.         Pertinent positive and negative review of systems were noted in the above HPI. All other ROS was otherwise negative.   Vital Signs:  Patient profile:   75 year old female Height:      64 inches Weight:      140 pounds BMI:     24.12 BSA:     1.68 Pulse rate:   88 / minute Pulse rhythm:   regular BP sitting:   118 / 64  (left arm) Cuff size:   regular  Vitals Entered By: Hope Pigeon CMA (Mar 19, 2010 9:44 AM)   Physical Exam  General:  alert and oriented. In no distress. Eyes:  nonicteric. Neck:  Supple; no masses or thyromegaly. Lungs:  Clear throughout to auscultation. Heart:  Regular rate and rhythm; no murmurs, rubs,  or bruits. Abdomen:  AVMs of her abdominal wall, soft abdomen with normoactive bowel sounds. No tenderness. No bruit. Msk:  Symmetrical with no gross  deformities. Normal posture. Extremities:  No clubbing, cyanosis, edema or deformities noted. Skin:  widespread AVMs of the upper and lower extremities. Psych:  Alert and cooperative. Normal mood and affect.   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 Her H&H is stable. She needs a CBC on a monthly basis and needs to continue her oral iron supplements.  Problem # 2:  HIATAL HERNIA (ICD-553.3) Patient has gastroesophageal reflux and Barrett's esophagus diagnosed on her last upper endoscopy in 2009. She is due for a repeat upper endoscopy. We will schedule that and she needs to continue omeprazole 20 mg twice a day as well.  Other Orders: EGD (EGD)  Patient Instructions: 1)  continue omeprazole 20 mg p.o. b.i.d. 2)  Antireflux measures. 3)  Upper endoscopy to followup on Barrett's esophagus. 4)  Recall colonoscopy in July 2020. 5)  monthly CBC, continue iron supplements. 6)  Copy sent to : Dr Jerilynn Mages.Norins 7)  The medication list was reviewed and reconciled.  All changed / newly prescribed medications were explained.  A complete medication list was provided to the patient / caregiver.

## 2010-11-21 NOTE — Letter (Signed)
Summary: EGD Instructions  [facility]  [place], [place] [address]   Phone: [phone]  Fax: [phone]       Paula Ward    [birth year]    MRN: [id number]       Procedure Day /Date: 03/28/10 Thursday     Arrival Time: 10:30 am     Procedure Time: 11:30 am     Location of Procedure:                    _ x _ [place] (4th Floor)  PREPARATION FOR ENDOSCOPY   On 03/28/10 THE DAY OF THE PROCEDURE:  1.   No solid foods, milk or milk products are allowed after midnight the night before your procedure.  2.   Do not drink anything colored red or purple.  Avoid juices with pulp.  No orange juice.  3.  You may drink clear liquids until 9:30 am, which is 2 hours before your procedure.                                                                                                CLEAR LIQUIDS INCLUDE: Water Jello Ice Popsicles Tea (sugar ok, no milk/cream) Powdered fruit flavored drinks Coffee (sugar ok, no milk/cream) Gatorade Juice: apple, white grape, white cranberry  Lemonade Clear bullion, consomm, broth Carbonated beverages (any kind) Strained chicken noodle soup Hard Candy   MEDICATION INSTRUCTIONS  Unless otherwise instructed, you should take regular prescription medications with a small sip of water as early as possible the morning of your procedure.                 OTHER INSTRUCTIONS  You will need a responsible adult at least 73 years of age to accompany you and drive you home.   This person must remain in the waiting room during your procedure.  Wear loose fitting clothing that is easily removed.  Leave jewelry and other valuables at home.  However, you may wish to bring a book to read or an iPod/MP3 player to listen to music as you wait for your procedure to start.  Remove all body piercing jewelry and leave at home.  Total time from sign-in until discharge is approximately 2-3 hours.  You should go  home directly after your procedure and rest.  You can resume normal activities the day after your procedure.  The day of your procedure you should not:   Drive   Make legal decisions   Operate machinery   Drink alcohol   Return to work  You will receive specific instructions about eating, activities and medications before you leave.    The above instructions have been reviewed and explained to me by  Madlyn Frankel CMA [name])  Mar 19, 2010 10:41 AM     I fully understand and can verbalize these instructions _____________________________ Date 03/19/10

## 2010-11-21 NOTE — Letter (Signed)
Summary: [facility]   Imported By: Phillis Knack 05/03/2010 13:27:38  _____________________________________________________________________  External Attachment:    Type:   Image     Comment:   External Document

## 2010-11-21 NOTE — Procedures (Signed)
Summary: Upper Endoscopy  Patient: [name] Note: All result statuses are Final unless otherwise noted.  Tests: (1) Upper Endoscopy (EGD)   EGD Upper Endoscopy       [facility]     [place], [place]  [address]           ENDOSCOPY PROCEDURE REPORT           PATIENT:  Paula Ward, Paula Ward  MR#:  [id number]     BIRTHDATE:  11-05-1935, 73 yrs. old  GENDER:  female           ENDOSCOPIST:  Lowella Bandy. Olevia Perches, MD     Referred by:           PROCEDURE DATE:  03/28/2010     PROCEDURE:  EGD with biopsy     ASA CLASS:  Class II     INDICATIONS:  abdominal pain, chest pain CREST syndrome, AVM's,     chronic GI blood loss,     last EGD 08/2006           MEDICATIONS:   Versed 6 mg, Fentanyl 50 mcg     TOPICAL ANESTHETIC:  Exactacain Spray           DESCRIPTION OF PROCEDURE:   After the risks benefits and     alternatives of the procedure were thoroughly explained, informed     consent was obtained.  The [facility] GIF-H180 [id number] endoscope was     introduced through the mouth and advanced to the second portion of     the duodenum, without limitations.  The instrument was slowly     withdrawn as the mucosa was fully examined.     <<PROCEDUREIMAGES>>           A sessile polyp was found in the mid esophagus. at 30 cm 4 mm flat     polyp With standard forceps, a biopsy was obtained and sent to     pathology (see image8).  irregular Z-line. With standard forceps,     a biopsy was obtained and sent to pathology (see image7).  A     hiatal hernia was found (see image2 and image1). 4 cm hiatal     hernia, 36-40 cm  Otherwise the examination was normal (see     image3, image4, image5, and image6). no avm's noted on this exam     Retroflexed views revealed no abnormalities.    The scope was then     withdrawn from the patient and the procedure completed.           COMPLICATIONS:  None           ENDOSCOPIC IMPRESSION:     1) Sessile polyp in the mid esophagus     2)  Irregular Z-line     3) Hiatal hernia     4) Otherwise normal examination     symptoms likely dur to GERD     RECOMMENDATIONS:     increase Prelosec to 40 mg po bid, # 60, 1 po bid, x 10 refills     await biopsy results           REPEAT EXAM:  No           ______________________________     Lowella Bandy. Olevia Perches, MD           CC:           n.  eSIGNED:   Lowella Bandy. Kaoru Benda at 03/28/2010 11:04 AM           Mickle Plumb, [phone]  Note: An exclamation mark (!) indicates a result that was not dispersed into the flowsheet. Document Creation Date: 03/28/2010 11:05 AM _______________________________________________________________________  (1) Order result status: Final Collection or observation date-time: 03/28/2010 10:51 Requested date-time:  Receipt date-time:  Reported date-time:  Referring Physician:   Ordering Physician: Delfin Edis [phone]) Specimen Source:  Source: Tawanna Cooler Order Number: [id number] Lab site:

## 2010-11-21 NOTE — Progress Notes (Signed)
  Phone Note Outgoing Call   Reason for Call: Discuss lab or test results Summary of Call: please call patient: Hgb and liver functions are ok. Copy sent to Dr. Gaynell Face  Thanks Initial call taken by: Neena Rhymes MD,  November 08, 2009 5:55 AM  Follow-up for Phone Call        informed pt of results/ ab Follow-up by: Ami Bullins CMA,  November 08, 2009 9:18 AM

## 2010-11-21 NOTE — Letter (Signed)
Summary: [facility]   [facility] Return Visit   Imported By: Sallee Provencal 07/26/2010 13:17:59  _____________________________________________________________________  External Attachment:    Type:   Image     Comment:   External Document

## 2010-11-21 NOTE — Progress Notes (Signed)
Summary: update   Phone Note Call from Patient Call back at Home Phone [phone] Call back at cell after [phone]   Caller: Patient Call For: Dr. Olevia Perches Reason for Call: Talk to Nurse Summary of Call: came into office to report persistent back pain and constipation Initial call taken by: Lucien Mons,  September 06, 2010 2:49 PM  Follow-up for Phone Call        Patient calling to report  for the last 4-6 weeks, she has had lower back pain that gets better when she has a BM. She saw [name] on 08/19/10 and he did a KUB that did not show a blockage. He told her to take MOM two times a day for 2 days which she did. She is also taking Metamucil, increased fiber in her diet and drinks lots of water. Her BM is normal once she has it but it takes her "a long time to go." Denies nausea and vomiting. Please, advise.  Additional Follow-up for Phone Call Additional follow up Details #1::        Reviewed and agree. An alternative Rx Miralax 9 gm  ( 1/////2 capful) every other day. Additional Follow-up by: Lafayette Dragon MD,  September 06, 2010 10:27 PM     Appended Document: update Patient notified of Dr. Nichola Sizer recommendations. Patient will call back if  needed.

## 2010-11-21 NOTE — Progress Notes (Signed)
Summary: STANDING ORDER  Phone Note Call from Patient   Summary of Call: Pt was asked from Dr Gaynell Face at [facility] to go to kidney specialist b/c of medications she is on. Pt is seeing Dr Marval Regal. They are requesting CMP q 2 mths, to fax results to [phone]. Ok to add this standing order?   (Pt aware we will only contact her w/problems) Initial call taken by: Charlsie Quest, [place],  April 24, 2010 5:53 PM

## 2010-11-21 NOTE — Letter (Signed)
Summary: [facility] / [place] [facility] person / [place] [employer] [facility]   Imported By: Rise Patience 10/07/2010 16:24:16  _____________________________________________________________________  External Attachment:    Type:   Image     Comment:   External Document

## 2010-11-21 NOTE — Progress Notes (Signed)
  Phone Note Outgoing Call   Reason for Call: Discuss lab or test results Summary of Call: please call: Hgb stable at 10.8 grams. Liver functions are normal. Copy is faxed to Dr. Gaynell Face.   Thanks Initial call taken by: Neena Rhymes MD,  January 01, 2010 5:05 PM  Follow-up for Phone Call        informed pt of results Follow-up by: Ami Bullins CMA,  January 02, 2010 9:27 AM

## 2010-11-21 NOTE — Letter (Signed)
Summary: Patient Notice-[facility]  [place], [place] [address]   Phone: [phone]  Fax: [phone]        March 30, 2010 MRN: [id number]    [facility] [address] [place], [place]  [address]    Dear Ms. Harkleroad,  I am pleased to inform you that the biopsies taken during your recent endoscopic examination did not show any evidence of cancer upon pathologic examination.  However, your biopsies indicate you have a condition known as Barrett's esophagus. While not cancer, it is pre-cancerous (can progress to cancer) and needs to be monitored with repeat endoscopic examination and biopsies.  Fortunately, it is quite rare that this develops into cancer, but careful monitoring of the condition along with taking your medication as prescribed is important in reducing the risk of developing cancer.  It is my recommendation that you have a repeat upper gastrointestinal endoscopic examination in 2 years.  Additional information/recommendations:  __Please call [phone] to schedule a return visit to further      evaluate your condition.  __Continue with treatment plan as outlined the day of your exam.  Please call us if you have or develop heartburn, reflux symptoms, any swallowing problems, or if you have questions about your condition that have not been fully answered at this time.  Sincerely,  Lafayette Dragon MD  This letter has been electronically signed by your physician.  Appended Document: Patient Notice-[facility] letter mailed 6.16.11

## 2010-11-21 NOTE — Progress Notes (Signed)
  Phone Note Outgoing Call   Reason for Call: Discuss lab or test results Summary of Call: Please call pt: routine follow-up lab with stable Hgb at 10.1 g and normal liver functions. Copy faxed to Dr. Gaynell Face  Thanks Initial call taken by: Neena Rhymes MD,  February 10, 2010 10:45 PM  Follow-up for Phone Call        informed pt of results Follow-up by: Ami Bullins CMA,  February 11, 2010 3:14 PM

## 2010-11-21 NOTE — Progress Notes (Signed)
Summary: Lab results   Phone Note Call from Patient Call back at Home Phone [phone]   Caller: Patient Call For: Dr. Olevia Perches Reason for Call: Lab or Test Results Summary of Call: Pt is calling about her lab results from 01-01-10 Initial call taken by: Webb Laws,  January 09, 2010 10:25 AM  Follow-up for Phone Call        DR.Finch Costanzo--Please review pt. CBC from 01-01-10, it is a f/u from her CBC done 11-05-09.  Follow-up by: Vivia Ewing LPN,  January 09, 813 10:32 AM  Additional Follow-up for Phone Call Additional follow up Details #1::        H/H unchanged,  repeat in 2 months    Additional Follow-up for Phone Call Additional follow up Details #2::    Message left for pt. with above MD instructions. Lab order is in [facility] for 03-08-10. Pt. instructed to call back as needed.  Follow-up by: Vivia Ewing LPN,  January 09, 4817 1:52 PM

## 2010-11-21 NOTE — Miscellaneous (Signed)
Summary: prelosec rx.  Clinical Lists Changes  Medications: Added new medication of PRILOSEC 40 MG  CPDR (OMEPRAZOLE) 1 twice a day 30 minutes before meals - Signed Rx of PRILOSEC 40 MG  CPDR (OMEPRAZOLE) 1 twice a day 30 minutes before meals;  #60 x 10;  Signed;  Entered by: Salome Arnt RN;  Authorized by: Lafayette Dragon MD;  Method used: Electronically to [facility]. # [id number]*, [address] [place], [place], [place]  [address], Ph: [phone] or [phone], Fax: [phone]    Prescriptions: PRILOSEC 40 MG  CPDR (OMEPRAZOLE) 1 twice a day 30 minutes before meals  #60 x 10   Entered by:   Salome Arnt RN   Authorized by:   Lafayette Dragon MD   Signed by:   Salome Arnt RN on 03/28/2010   Method used:   Electronically to        [facility]. # [id number]* (retail)       [place]       [place], [place]  [address]       Ph: [phone] or [phone]       Fax: [phone]   RxID:   [id number]

## 2010-11-25 ENCOUNTER — Telehealth: Payer: Self-pay | Admitting: Internal Medicine

## 2010-11-27 NOTE — Letter (Signed)
Summary: [facility]   Imported By: Phillis Knack 11/21/2010 15:04:07  _____________________________________________________________________  External Attachment:    Type:   Image     Comment:   External Document

## 2010-12-05 NOTE — Progress Notes (Signed)
Summary: RESULTS  Phone Note Call from Patient   Summary of Call: Patient is requesting results of labs Initial call taken by: Charlsie Quest, [place],  November 25, 2010 10:07 AM  Follow-up for Phone Call        AST 41 - close to normal, Hgb 11.3. Results were faxed to [facility] Follow-up by: Neena Rhymes MD,  November 25, 2010 12:51 PM  Additional Follow-up for Phone Call Additional follow up Details #1::        left detailed vm on pt's hm # Additional Follow-up by: Charlsie Quest, CMA,  November 25, 2010 6:44 PM

## 2010-12-12 ENCOUNTER — Other Ambulatory Visit: Payer: Medicare Other

## 2010-12-12 ENCOUNTER — Encounter (INDEPENDENT_AMBULATORY_CARE_PROVIDER_SITE_OTHER): Payer: Self-pay | Admitting: *Deleted

## 2010-12-12 ENCOUNTER — Other Ambulatory Visit: Payer: Self-pay | Admitting: Internal Medicine

## 2010-12-12 DIAGNOSIS — T887XXA Unspecified adverse effect of drug or medicament, initial encounter: Secondary | ICD-10-CM

## 2010-12-12 DIAGNOSIS — K921 Melena: Secondary | ICD-10-CM

## 2010-12-12 LAB — CBC WITH DIFFERENTIAL/PLATELET
Basophils Relative: 0.4 % (ref 0.0–3.0)
Eosinophils Absolute: 0.1 10*3/uL (ref 0.0–0.7)
MCHC: 34.7 g/dL (ref 30.0–36.0)
MCV: 98.6 fl (ref 78.0–100.0)
Monocytes Absolute: 0.4 10*3/uL (ref 0.1–1.0)
Neutrophils Relative %: 63.6 % (ref 43.0–77.0)
Platelets: 178 10*3/uL (ref 150.0–400.0)
RBC: 2.82 Mil/uL — ABNORMAL LOW (ref 3.87–5.11)

## 2010-12-12 LAB — HEPATIC FUNCTION PANEL
Bilirubin, Direct: 0.1 mg/dL (ref 0.0–0.3)
Total Protein: 6.7 g/dL (ref 6.0–8.3)

## 2010-12-15 ENCOUNTER — Telehealth: Payer: Self-pay | Admitting: Internal Medicine

## 2010-12-26 NOTE — Progress Notes (Signed)
  Phone Note Outgoing Call   Summary of Call: please fax a copy to Dr. Gaynell Face and call pt with normal resutls.  Thanks Initial call taken by: Neena Rhymes MD,  December 15, 2010 11:24 PM  Follow-up for Phone Call        Copy faxed to DR Tapson at [phone] Follow-up by: Ami Bullins CMA,  December 16, 2010 11:33 AM

## 2011-01-21 ENCOUNTER — Other Ambulatory Visit: Payer: Self-pay | Admitting: Obstetrics

## 2011-02-03 ENCOUNTER — Ambulatory Visit (INDEPENDENT_AMBULATORY_CARE_PROVIDER_SITE_OTHER): Payer: Medicare Other | Admitting: Internal Medicine

## 2011-02-03 ENCOUNTER — Encounter: Payer: Self-pay | Admitting: Internal Medicine

## 2011-02-03 VITALS — BP 118/56 | HR 95 | Temp 98.7°F | Wt 143.0 lb

## 2011-02-03 DIAGNOSIS — N2889 Other specified disorders of kidney and ureter: Secondary | ICD-10-CM

## 2011-02-03 DIAGNOSIS — N289 Disorder of kidney and ureter, unspecified: Secondary | ICD-10-CM

## 2011-02-03 DIAGNOSIS — C649 Malignant neoplasm of unspecified kidney, except renal pelvis: Secondary | ICD-10-CM | POA: Insufficient documentation

## 2011-02-04 NOTE — Progress Notes (Signed)
  Subjective:    Patient ID: Paula Ward, female    DOB: [birth year], 75 y.o.   MRN: [id number]  HPIMrs. Owens Shark, along with her husband, presents to review recent CT scan of chest done at [facility] as follow up for her CREST and severe pulmonary hypertension. An incidental finding was a 1.8 x1.5 cm solid appearing nodule in the upper pole of the left kidney. She was instructed to have a follow-up imaging study as part of further diagnostic work-up for a possible hypernephroma.  PMH, FamHx and SocHx reviewed for any changes and relevance usig centricity records (chart not abstracted)          Review of Systems  Constitutional: Negative.   HENT: Negative.   Eyes: Positive for redness.  Respiratory: Positive for chest tightness and shortness of breath. Negative for cough and wheezing.   Cardiovascular: Negative.   All other systems reviewed and are negative.       Objective:   Physical Exam WNWD white woman in no distress Cor- RRR Chest - no increased work of breathing, wheezing or other respiratory symptoms       Assessment & Plan:  1. Kidney nodule - reviewed Dr. Karlene Einstein note from [facility] and the CT report  Plan- spoke with radiologist who recommend CT w and w/o contrast as best study to characterize nodule - ordered          Discussed with the patient sequence of events: CT scan - guided needle biopsy - RFA kidney tumor if positive path.

## 2011-02-05 ENCOUNTER — Ambulatory Visit (INDEPENDENT_AMBULATORY_CARE_PROVIDER_SITE_OTHER)
Admission: RE | Admit: 2011-02-05 | Discharge: 2011-02-05 | Disposition: A | Discharge status: Home / Self Care | Payer: Medicare Other | Source: Ambulatory Visit | Attending: Internal Medicine | Admitting: Internal Medicine

## 2011-02-05 ENCOUNTER — Encounter: Payer: Self-pay | Admitting: Internal Medicine

## 2011-02-05 DIAGNOSIS — N2889 Other specified disorders of kidney and ureter: Secondary | ICD-10-CM

## 2011-02-05 DIAGNOSIS — N289 Disorder of kidney and ureter, unspecified: Secondary | ICD-10-CM

## 2011-02-05 MED ORDER — IOHEXOL 300 MG/ML  SOLN
80.0000 mL | Freq: Once | INTRAMUSCULAR | Status: AC | PRN
  Administered 2011-02-05: 80 mL via INTRAVENOUS

## 2011-02-10 ENCOUNTER — Telehealth: Payer: Self-pay | Admitting: *Deleted

## 2011-02-10 ENCOUNTER — Encounter: Payer: Self-pay | Admitting: Internal Medicine

## 2011-02-10 ENCOUNTER — Other Ambulatory Visit: Payer: Self-pay | Admitting: Internal Medicine

## 2011-02-10 DIAGNOSIS — N2889 Other specified disorders of kidney and ureter: Secondary | ICD-10-CM

## 2011-02-10 NOTE — Telephone Encounter (Signed)
Patient requesting results of CT when avail.

## 2011-02-11 ENCOUNTER — Other Ambulatory Visit: Payer: Self-pay | Admitting: Internal Medicine

## 2011-02-11 ENCOUNTER — Other Ambulatory Visit (INDEPENDENT_AMBULATORY_CARE_PROVIDER_SITE_OTHER): Payer: Medicare Other

## 2011-02-11 DIAGNOSIS — T887XXA Unspecified adverse effect of drug or medicament, initial encounter: Secondary | ICD-10-CM

## 2011-02-11 DIAGNOSIS — D649 Anemia, unspecified: Secondary | ICD-10-CM

## 2011-02-11 LAB — HEPATIC FUNCTION PANEL
ALT: 26 U/L (ref 0–35)
AST: 35 U/L (ref 0–37)
Albumin: 3.9 g/dL (ref 3.5–5.2)
Alkaline Phosphatase: 46 U/L (ref 39–117)
Total Protein: 6.8 g/dL (ref 6.0–8.3)

## 2011-02-11 LAB — COMPREHENSIVE METABOLIC PANEL
ALT: 26 U/L (ref 0–35)
AST: 35 U/L (ref 0–37)
Creatinine, Ser: 1.7 mg/dL — ABNORMAL HIGH (ref 0.4–1.2)
Total Bilirubin: 0.6 mg/dL (ref 0.3–1.2)

## 2011-02-11 LAB — CBC WITH DIFFERENTIAL/PLATELET
Basophils Relative: 0.4 % (ref 0.0–3.0)
Eosinophils Absolute: 0.1 10*3/uL (ref 0.0–0.7)
Eosinophils Relative: 0.9 % (ref 0.0–5.0)
Hemoglobin: 10.4 g/dL — ABNORMAL LOW (ref 12.0–15.0)
Lymphocytes Relative: 24.9 % (ref 12.0–46.0)
MCHC: 34.3 g/dL (ref 30.0–36.0)
Monocytes Relative: 5.6 % (ref 3.0–12.0)
Neutro Abs: 4.3 10*3/uL (ref 1.4–7.7)
RBC: 3.02 Mil/uL — ABNORMAL LOW (ref 3.87–5.11)

## 2011-02-15 ENCOUNTER — Encounter: Payer: Self-pay | Admitting: Internal Medicine

## 2011-02-17 ENCOUNTER — Encounter: Payer: Self-pay | Admitting: Internal Medicine

## 2011-02-25 ENCOUNTER — Ambulatory Visit
Admission: RE | Admit: 2011-02-25 | Discharge: 2011-02-25 | Disposition: A | Discharge status: Home / Self Care | Payer: Medicare Other | Source: Ambulatory Visit | Attending: Internal Medicine | Admitting: Internal Medicine

## 2011-02-25 VITALS — BP 131/54 | HR 72 | Temp 97.2°F | Resp 16 | Ht 64.0 in | Wt 142.0 lb

## 2011-02-25 DIAGNOSIS — N2889 Other specified disorders of kidney and ureter: Secondary | ICD-10-CM

## 2011-02-25 HISTORY — DX: Anemia, unspecified: D64.9

## 2011-02-25 HISTORY — DX: Cr(e)st syndrome: M34.1

## 2011-02-25 HISTORY — DX: Gastro-esophageal reflux disease without esophagitis: K21.9

## 2011-02-25 HISTORY — DX: Chronic kidney disease, unspecified: N18.9

## 2011-02-25 HISTORY — DX: Other pulmonary embolism without acute cor pulmonale: I26.99

## 2011-03-04 NOTE — Discharge Summary (Signed)
NAMEKEVIA, [name]               ACCOUNT NO.:  [id number]   MEDICAL RECORD NO.:  [id number]          PATIENT TYPE:  INP   LOCATION:  [place]                         FACILITY:  [facility]   PHYSICIAN:  Heinz Knuckles. Norins, MD  DATE OF BIRTH:  June 08, 1936   DATE OF ADMISSION:  07/27/2008  DATE OF DISCHARGE:  07/30/2008                               DISCHARGE SUMMARY   ADMITTING DIAGNOSES:  1. Upper gastrointestinal bleed with acute blood loss.  2. Supratherapeutic INR/coagulopathy.  3. History of pulmonary embolism on chronic anticoagulation.  4. Pulmonary hypertension.  5. Gastroesophageal reflux disease.  6. Scleroderma.   DISCHARGE DIAGNOSES:  1. Upper gastrointestinal bleed with acute blood loss.  2. Supratherapeutic INR/coagulopathy.  3. History of pulmonary embolism on chronic anticoagulation.  4. Pulmonary hypertension.  5. Gastroesophageal reflux disease.  6. Scleroderma.   CONSULTANTS:  Dr. Lucio Edward for GI.   PROCEDURES:  The patient had an upper GI endoscopy July 28, 2008 which  showed angiodysplasia of the stomach and duodenum and unspecified  gastritis.  No other procedures performed.   HISTORY OF PRESENT ILLNESS:  [name] is a delightful 75 year old woman  with known scleroderma and severe pulmonary hypertension with previous  history of PE on chronic Coumadin.  She presented with a 3-day history  of progressive weakness, dizziness and dark black starry stools.  Her  INR has been consistently in the 2-3 range over the last several months.  On admission, she had no nausea, vomiting, diarrhea, abdominal pain,  chest pain, shortness of breath, palpitations or syncopal events.  Her  admission evaluation revealed an INR of 7.1 with a hemoglobin of 6.6 gm  resulting in her admission.   HOSPITAL COURSE:  1. Upper GI bleed.  The patient with a GI bleed with a significant      drop in hemoglobin to 6.6 grams.  GI consultation was requested,      and the patient was seen  and evaluated by the GI service with upper      endoscopy with results as noted.  It was felt to __________ for      chronic erosions.  Proton pump inhibitor therapy was recommended      that.  The patient did receive 4 units of fresh frozen plasma at      the time of admission, as well as getting 2 units of packed red      cells.  On this regimen, her hemoglobin stabilized at 9.4 gm and      was 9.1 gm at discharge dictation.  She had no further active      bleeding, although she continued to have black stools which was to      be anticipated.  2. Coagulopathy.  The patient's Coumadin was stopped at admission.      She was reversed with fresh frozen plasma as noted.  She was given      vitamin K as noted.  GI recommended that she could resume her      Coumadin 3-5 days after EGD performed July 28, 2008.  Final INR  was 1.7 on July 29, 2008.  Plan - the patient is to resume her      Coumadin at her home dose on July 31, 2008, and she is to call      to schedule an appointment at the [place] clinic on August 03, 2008      for follow up.  3. Pulmonary hypertension.  The patient has been stable with no      respiratory distress, no change in her condition.  4. Her GERD and scleroderma have been stable.   DISCHARGE EXAMINATION:  VITAL SIGNS:  Temperature is 97.6, blood  pressure 122/54, heart rate 77, respirations were 22, O2 saturation 98%  on room air.  GENERAL APPEARANCE:  This is a well-nourished woman who appears to be  comfortable with no distress.  HEENT:  Grossly unremarkable.  CHEST:  The patient is moving air well.  CARDIOVASCULAR:  A quiet precordium was noted.  She had a regular rate  and rhythm.  ABDOMEN:  Soft.  No tenderness to palpation.  No further examination  conducted.   FINAL LABORATORIES:  Hemoglobin on the day of discharge was 9.1 gm,  hematocrit 26.9%.  BMET on admission with a sodium of 143, potassium  4.2, chloride of 110, CO2 of 26, BUN of 37,  creatinine 1.06, glucose was  92.   DISCHARGE MEDICATIONS:  The patient will resume all of her home  medications as noted on the medication reconciliation form including:  1. Spironolactone 25 mg b.i.d.  2. Omeprazole listed at 20 mg b.i.d.  3. Diltiazem 120 mg daily.  4. Tracleer 125 mg b.i.d.  5. Furosemide 20 mg p.r.n.  6. Xanax 0.25 mg t.i.d. p.r.n.  7. Fish oil p.r.n.  8. Fluticasone nasal spray.   DISPOSITION:  The patient is discharged home.  She will follow up with  the [place] clinic as noted on Thursday, August 03, 2008.  She will follow  with Dr. Gaynell Face at [facility] as scheduled.  She will see [name] for  primary care on an as-needed basis.  She will continue to have monthly  laboratory for routine follow-up.   CONDITION AT TIME OF DISCHARGE DICTATION:  Stable and improved.      Heinz Knuckles Norins, MD  Electronically Signed     MEN/MEDQ  D:  07/30/2008  T:  07/31/2008  Job:  [id number]   cc:   Lowella Bandy. Olevia Perches, [facility] [address]  [place]  [place] [address]   Dr. Gaynell Face  Dept. of Pulmonary Medicine  [facility]

## 2011-03-04 NOTE — Discharge Summary (Signed)
NAMEJAZIA, Paula Ward               ACCOUNT NO.:  [id number]   MEDICAL RECORD NO.:  [id number]          PATIENT TYPE:  OIB   LOCATION:  6063                         FACILITY:  [facility]   PHYSICIAN:  Juanda Bond. 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   SECONDARY DIAGNOSES:  1. History of pulmonary embolus.  2. Severe pulmonary hypertension.  3. Acute on chronic blood loss anemia.  4. CREST syndrome.   PROCEDURES PERFORMED:  Percutaneous IVC filter placement on August 09, 2008.   HOSPITAL COURSE:  [name] was admitted electively for IVC filter  placement.  She has a history of pulmonary embolus and severe pulmonary  hypertension in the setting of CREST syndrome.  She has had recurrent GI  bleeding secondary to AVMs.  She was hospitalized last week with a  hemoglobin of 6.6 and required discontinuation of Coumadin along with  packed red blood cell transfusion.  She has required iron infusions in  the past for chronic iron-deficiency anemia secondary to GI blood loss.  A decision was made to discontinue her anticoagulation altogether in the  setting of recurrent and severe GI bleeding.  After consultation, I  elected to place an IVC filter because of her thromboembolic risk in the  setting of history of PE and severe pulmonary hypertension.  The patient  tolerated her procedure well.  There were no complications.  She was  observed overnight because of progressive anemia.  Her hemoglobin at  hospital discharge from her recent hospitalization was 10.6 on August 04, 2008.  Her hemoglobin on this admission on August 09, 2008, was  9.0.  On the day of discharge, August 10, 2008, her hemoglobin is 8.1.  She remained hemodynamically stable and asymptomatic.   She is to be  discharged on August 10, 2008, with close followup early next week.  She will have a CBC drawn Monday, August 14, 2008.  She has followup  scheduled next month with Dr. Delfin Edis.  She is also followed by Dr.  Linda Hedges.  I spoke with Dr. Olevia Perches on the telephone and she will follow up  her hemoglobin next week and likely will schedule her for an iron  infusion, pending that result.   DISCHARGE MEDICATIONS:  1. Torsemide 20 mg daily.  2. Spironolactone 50 mg daily.  3. Omeprazole 20 mg daily.  4. Diltiazem 120 mg daily.  5. Tracleer 125 mg b.i.d.  6. Fluticasone 50 mcg as needed.   FOLLOWUP:  As scheduled with Dr. Olevia Perches next month and also with Dr.  Linda Hedges.   FOLLOWUP LABORATORY DATA:  As outlined above, Monday August 14, 2008,  at the [facility].   The patient was specifically instructed to avoid Coumadin and aspirin.  These medications have been discontinued at her previous  hospitalization.   DISCHARGE CONDITION:  Stable.      Juanda Bond. Burt Knack, MD  Electronically Signed  MDC/MEDQ  D:  08/10/2008  T:  08/10/2008  Job:  [id number]   cc:   Lowella Bandy. Olevia Perches, MD  Heinz Knuckles Norins, MD

## 2011-03-04 NOTE — H&P (Signed)
Paula Ward, Paula Ward               ACCOUNT NO.:  [id number]   MEDICAL RECORD NO.:  [id number]          PATIENT TYPE:  INP   LOCATION:  5123                         FACILITY:  [facility]   PHYSICIAN:  Lowella Bandy. Olevia Perches, MD     DATE OF BIRTH:  07-Nov-1935   DATE OF ADMISSION:  08/03/2008  DATE OF DISCHARGE:                              HISTORY & PHYSICAL   REASON FOR ADMISSION:  Recurrent symptomatic anemia.   HISTORY OF PRESENT ILLNESS:  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  She was just recently hospitalized on  July 27, 2008, through the 11th at [facility] on the  internal medicine service because of upper GI bleed resulting in  worsening of her anemia.  She required transfusion with 3 units of  packed red blood cells and four of fresh frozen plasma in order to  correct an INR of 7.  She takes Coumadin because of a history of  pulmonary embolus.  During that hospitalization, her hemoglobin was as  low as 6.6.  It was 9.1 at discharge on July 30, 2008.   The patient has been having black stools leading up to that recent  admission and they have never resolved.  She does take a low dose of  aspirin included, which is contained in the [employer] multivitamin.  She has not been able to tolerate larger doses of oral iron.  In the  past, she has received doses of parenteral iron, but the last time she  was treated with parenteral iron was probably 2 or 3 years ago.   The patient has been feeling again more progression in dizziness and  weakness.  The stools are loose and somewhat tarry.  She has not seen  any red blood per rectum.  She does not have any abdominal pain.  She  has no chest pain and no palpitations.  She is a bit short of breath  with exertion.  She was seen as a work-in by Dr. Delfin Edis today and  arrangements were made for direct admission to the hospital in order to  expedite blood transfusions.  Also, she will be evaluated by Cardiology  with consideration for placement of an IVC filter to prevent future  pulmonary emboli with a goal being discontinuation permanently of  Coumadin.   In the office today, her hemoglobin was 7.5.  We are awaiting pending PT  and INR.   CURRENT MEDICATIONS:  1. Coumadin 5 mg every day except Tuesday and Thursday when she takes      7.5 mg daily.  2. Spirolactone 25 mg twice daily.  3. Omeprazole 20 mg twice daily.  4. Tracleer 125 mg twice daily.  5. Nasacort AQ 55 mcg per dose used daily as needed.  6. Torsemide 20 mg as needed.  7. Xanax 0.25 mg as needed.  8. Fish oil 1200 mg capsules one tablets once daily.  9. Centrum Kids if with iron  once daily.  10.Tylenol p.r.n.   ALLERGIES:  To CODEINE and question whether she has allergy to Mycin-  type drugs.   PAST MEDICAL HISTORY:  1. History of left breast cancer.  She is status post lumpectomy and      underwent radiation treatment more than 10 years ago.  She did not      have chemotherapy.  She has not had mastectomy.  2. Status post appendectomy.  3. Status post tonsillectomy.  4. 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  This is followed by physicians at [facility].  5. Interstitial lung disease.  6. History of recurrent blood loss related anemia and chronic of blood      loss anemia.  7. Gastroesophageal reflux disease.  8. Hypertension.  9. History of pulmonary embolism in 2003.  10.History of pneumonia in December 2007.  11.Scleroderma and CREST syndrome.  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   FAMILY HISTORY:  Bladder cancer and diabetes mellitus in the father.  Alzheimer's in her mother.  She has a sister who was a smoker who  developed lung  cancer.  There is no family history of breast or colon  cancer.  No family history of coronary artery disease or MI.   SOCIAL HISTORY:  The patient is married.  She has two biological  children and adopted children.  She is retired.  She does not smoke  cigarettes or drink alcohol.   REVIEW OF SYSTEMS:  CONSTITUTIONAL:  She has not had any weight loss.  Energy is decreased.  NEURO:  Eyesight without problems.  No headaches.  No tingling or numbness of the extremities or face.  ENT:  No  nosebleeds.  CARDIOVASCULAR:  No chest pain.  No irregular heartbeat,  some increased heart rate with exertion.  RESPIRATORY:  Short of breath  and this has been progressive.  No cough.  No pleuritic pain.  GI:  No  dysphagia.  No constipation.  No abdominal pain.  GENITOURINARY:  No  dysuria and no frequency.  MUSCULOSKELETAL:  No significant arthralgias.  PSYCHIATRIC:  No mood swings.  No depressive episodes.  ENDOCRINE:  No  excessive thirst.  No history of elevated blood sugars.  No history of  thyroid problems.  HEMATOLOGIC:  Generally tend to have purpura.  DERMATOLOGIC:  The patient has multiple large telangiectasia on the  legs, arms, trunk, and face.  All other systems reviewed and negative.   PRIOR GI STUDIES:  On July 28, 2008, upper endoscopy showed a single  AVM in the gastric cardia, maximum size, 3 mm.  This was nonbleeding.  This was treated with APC application and was obliterated.  Also seen  was mild, patchy, nonerosive gastritis.  Colonoscopy in February 2005,  showed multiple AVMs of the right and left colon, though more dense in  the left colon.   PHYSICAL EXAMINATION:  VITAL SIGNS:  Temperature is 97.5, pulse is 83,  respirations are 18, blood pressure 129/45, and room air saturation 93%.  GENERAL:  The patient is a pleasant, well-appearing white female.  She  does not look acutely or chronically ill.  She is pleasant and provides  an excellent history.  HEENT:  Sclerae are  nonicteric, conjunctiva is pale, extraocular  movements are intact.  Oropharynx, there are telangiectasias on the  underside of the  tongue.  Oral mucosa is moist and without lesions.  NECK:  There is no JVD, no masses, and no thyromegaly.  Marland Kitchen LUNGS:  Clear to auscultation and percussion bilaterally.  CARDIOVASCULAR:  Regular rate and rhythm.  No murmurs, rubs, or gallops.  ABDOMEN:  Soft, nontender, and nondistended.  No hepatosplenomegaly or  masses.  RECTAL:  Notable for black, strongly Hemoccult-positive stool.  DERMATOLOGIC:  There are extensive large AVMs of the arms, thighs,  posterior and anterior trunk.  Smaller AVMs evident on the facial skin.  These all blanched with pressure.  No obvious purpura present.  NEUROLOGIC:  The patient is alert and oriented x3.  She is not  tremulous.  She moves all four limbs easily.  She walks without  assistance.  PSYCHIATRIC:  The patient is pleasant, cooperative, and provides an  excellent history.  No signs of any mood disorder.  EXTREMITIES:  No  cyanosis, clubbing, or edema.   IMPRESSION:  1. Ongoing gastrointestinal blood loss secondary to arteriovenous      malformations.  She almost certainly has these in locations other      than those seen on recent upper endoscopy.  Her recent      gastrointestinal bleed last week was in the setting of      supratherapeutic INR.  We need to rule out recurrent      supratherapeutic INR.  We also need to reassess her need for      chronic Coumadin.  2. Symptomatic anemia secondary to gastrointestinal blood loss.  3. Chronic Coumadin secondary to history of pulmonary emboli.  She      also has a pulmonary hypertension on the basis of thromboembolic      causes.  Need to consider placement of a IVC filter in order to try      to discontinue the need for chronic Coumadin in hopes of      controlling future bleeding events.  4. CREST syndrome, scleroderma.  5. Gastroesophageal reflux disease, did have  some patchy mild-to-      moderate gastritis on her recent upper endoscopy.   PLAN:  1. The patient to be transfused with packed red blood cells.      Depending on her INR levels, we may need to give FFP as well.  2. Continue most of her outpatient medications for now.  Coumadin is      on hold.  3. Cardiology has been consultation and will see the patient in      regards to placement of an IVC filter and discontinuation of      Coumadin.      Azucena Freed, PA-C      Lowella Bandy. Olevia Perches, MD  Electronically Signed    SG/MEDQ  D:  08/03/2008  T:  08/04/2008  Job:  [id number]

## 2011-03-04 NOTE — Op Note (Signed)
Paula Ward, Paula Ward               ACCOUNT NO.:  [id number]   MEDICAL RECORD NO.:  [id number]          PATIENT TYPE:  OIB   LOCATION:  9242                         FACILITY:  [facility]   PHYSICIAN:  Juanda Bond. Burt Knack, MD  DATE OF BIRTH:  [birth year]   DATE OF PROCEDURE:  08/09/2008  DATE OF DISCHARGE:                               OPERATIVE REPORT   PROCEDURE:  Inferior vena cava venogram and inferior vena cava filter  placement.   INDICATIONS:  [name] is a 75 year old woman with a history of  pulmonary thromboembolic disease and severe pulmonary hypertension.  She  has had documented pulmonary embolus.  She has developed recurrent GI  bleeding secondary to GI AVMs.  She has required multiple blood  transfusions and has been unable to tolerate Coumadin.  With her  recurrent bleeding and history of pulmonary embolism, she was referred  for IVC filter placement.   Risks and indications of procedure were reviewed with the patient.  Informed consent was obtained.  The right groin was prepped and draped,  anesthetized with 1% lidocaine.  Using a modified Seldinger technique, a  6-French delivery sheath was placed in the right femoral vein.  The  sheath and dilator were advanced up to the proximal right common iliac  vein.  A venogram was performed through the dilator, which has side  holes.  A total of 30 mL of contrast was given and imaging was performed  with digital subtraction.  It was difficult to visualize the inflow of  the renal veins.  Therefore, the sheath and dilator were advanced  approximately 4 cm into the inferior vena cava.  Repeat venography still  did not show the inflow from the renal veins well.  At that point, we  elected to place a pigtail catheter into the IVC and third venogram was  performed with total 30 mL of contrast at 20 mL per second under digital  subtraction.  The renal vein inflow was well visualized with the pigtail  catheter.  The renal veins were  higher than their normal position and  were at the level of L2.  At that point, a TrapEase IVC filter was  deployed under normal technique through the delivery sheath.  A cine  image was taken after deployment.  The patient tolerated the procedure  well.  There were no immediate complications.   CONCLUSIONS:  Successful inferior vena cava filter placement.      Juanda Bond. Burt Knack, MD  Electronically Signed     MDC/MEDQ  D:  08/09/2008  T:  08/10/2008  Job:  [id number]   cc:   Lowella Bandy. Olevia Perches, MD  Heinz Knuckles Norins, MD

## 2011-03-04 NOTE — Assessment & Plan Note (Signed)
[place]                         GASTROENTEROLOGY OFFICE NOTE   Paula Ward, Paula Ward                      MRN:          [id number]  DATE:04/12/2007                            DOB:          08/21/1936    Paula Ward is a 75 year old white female with AV malformation of the  stomach and small-bowel.  She has scleroderma syndrome and CREST  syndrome and chronic GI blood loss.  We have followed her with monthly  H&H and periodic iron transfusion.  Last iron infusion was in December  2007.  Her last hemoglobin on April 09, 2007, was 10.6, hematocrit 31.6,  with MCV 98.  Her last iron situation in February 2008 was 26% but her  ferritin was low at 8.  She is here today because she has had trouble  with evacuation of the stool, having some leakage when she goes for a  walk.  Her stools are black due to iron intake.  She denies any  constipation.  Her last colonoscopy in February 2005 did show evidence  of AV malformation in the left and right colon as well.   PHYSICAL EXAMINATION:  VITAL SIGNS:  Blood pressure 96/42, pulse 80,  weight 144 pounds.  GENERAL:  She has telangiectasias of the anterior chest.  ABDOMEN:  Soft, nontender.  RECTAL:  Shows black, trace hemoccult-positive stool.  Rectal tone was  slightly decreased.   IMPRESSION:  1. An 75 year old white female with CREST syndrome and chronic      gastrointestinal blood loss due to arteriovenous malformations.  2. Some fecal incontinence, likely due to rectocele or some rectal      irritation.   PLAN:  1. High-fiber diet given to the patient.  2. Fiber supplement, Benefiber samples given to take daily.  3. Anusol HC suppositories q.h.s. x1 week.  4. Repeat CBC as well as serum ferritin and iron studies last week in      July, then decide if iron infusion needed.     Paula Ward. Olevia Perches, MD  Electronically Signed    DMB/MedQ  DD: 04/12/2007  DT: 04/12/2007  Job #: [id number]   cc:   Heinz Knuckles.  Norins, MD

## 2011-03-04 NOTE — Discharge Summary (Signed)
Paula Ward, Paula Ward               ACCOUNT NO.:  [id number]   MEDICAL RECORD NO.:  [id number]          PATIENT TYPE:  INP   LOCATION:  5123                         FACILITY:  [facility]   PHYSICIAN:  Sandy Salaam. Deatra Ina, MD,FACGDATE OF BIRTH:  08-30-1936   DATE OF ADMISSION:  08/03/2008  DATE OF DISCHARGE:  08/04/2008                               DISCHARGE SUMMARY   ADMITTING DIAGNOSES:  1. Ongoing gastrointestinal blood loss secondary to arteriovenous      malformations.  She has known AVMs throughout her upper and lower      gastrointestinal tract.  2. Recurrent symptomatic anemia secondary to gastrointestinal blood      loss.  3. Chronic Coumadin with history of pulmonary emboli in 2003.      Consider placement of inferior vena cava filter and discontinuation      of Coumadin with hopes of controlling future bleeding and anemia      events.  4. Pulmonary hypertension, secondary to thromboembolic causes.  5. Scleroderma and calcinosis cutis, Raynaud phenomenon, esophageal      motility disorder, sclerodactyly, and telangiectasia syndrome.  6. Gastroesophageal reflux disease.  Upper endoscopy performed about a      week ago did show patchy mild gastritis.  7. History of left breast cancer.  Status post lumpectomy and      radiation treatment.  8. Counter to documented history, she has not had mastectomy.  She has      never undergone chemotherapy.  9. Status post tonsillectomy.  10.Status post appendectomy.  11.Interstitial lung disease.  12.Hypertension.  13.Pneumonia, hospitalized in December 2007.   DISCHARGE DIAGNOSES:  1. Recurrent anemia, symptomatic.  2. Anemia secondary to chronic gastrointestinal blood losses from      arteriovenous malformations.  3. Chronic Coumadin, secondary to history of pulmonary embolus.  INR      actually subtherapeutic on admission.  Coumadin now discontinued.      Plan is for outpatient placement of inferior vena cava filter by      Dr. Sherren Mocha from [place].   BRIEF HISTORY:  Paula Ward is a very pleasant 75 year old white female  who copes very well with her chronic medical diseases.  She has  longstanding scleroderma, crest.  Dating back to 2001, she has had  problems with anemia related to GI blood loss from AVMs.  On prior  studies including colonoscopies and endoscopies AVMs have been found in  both the upper and lower GI tract.  Her last hospitalization because of  GI blood loss related symptomatic anemia was on July 27, 2008, through  the July 30, 2008, at [facility].  At that time, INR 7 was  corrected with fresh-frozen plasma and she also received 3 units of  packed red blood cells because of low hemoglobin at 6.6.  Hemoglobin  measured 9.1 at discharge.  She underwent upper endoscopy during that  hospital admission.  On July 28, 2008, the endoscopy showed single AVM  in the gastric cardia, it was non bleeding, and it was treated with APC  fulguration.  Also, was seen some mild patchy  nonerosive gastritis.  Prior colonoscopy had been in February 2005, showing multiple AVMs in  the right and left colon.   On her recent admission besides a symptomatic anemia, the patient had  been having black tarry stools.  During the hospitalization and  following the hospitalization, the patient continued to have black  stools.  The fatigue never completely resolved and it worsened over the  course of the next several days.  She was worked into see Dr. Olevia Perches on  August 03, 2008.  She looked pale.  Her hemoglobin in the office  measured 7.5 and arrangements were made for her to be admitted,  transfused, and evaluated by Cardiology for possible IVC filter  placement in order to be able to discontinue Coumadin permanently.   CONSULTATIONS:  Juanda Bond. Burt Knack, MD, from [facility].   PROCEDURES:  None.   LABORATORIES:  Hemoglobin following transfusion was 10.6 and hematocrit  was 31.1.  PT was 16.5 and  INR was 1.3, on August 03, 2008, on August 04, 2008, they were 15.3 and 1.2 respectively.  Sodium 138, potassium  3.9, chloride 108, CO2 of 24, glucose 121, BUN 43, and creatinine 1.18.  Total bilirubin 0.3, alkaline phosphatase 34, AST 24, ALT 18, and  albumin 3.4.   Imaging/x-ray studies, none performed.   HOSPITAL COURSE:  The patient was admitted in the afternoon of August 03, 2008.  She was typed and crossed and over the course of the evening  received 2 units of packed red blood cells.  She tolerated transfusions  without any problems.  The following day, her hemoglobin had risen to  10.6.  Stools continued to be dark.   The patient was seen by Dr. Sherren Mocha.  He agreed that IVC filter  was a good option for this patient and planned to perform this as an  outpatient on Wednesday August 09, 2008, which was his first available  time to do this.  He felt comfortable allowing her to go home off  Coumadin.  He will be contacting the patient with the exact appointment  times and preop instructions.  As for follow up with Dr. Olevia Perches, she has  an office visit for September 01, 2008, at 2:15 p.m.  She will also be  contacted with an appointment for a lab visit around August 18, 2008.   MEDICATIONS AT DISCHARGE:  1. She will not continue Coumadin.  2. Tracleer 125 mg twice daily/spironolactone 25 mg once daily.  3. Omeprazole 20 mg twice daily.  4. Nasacort AQ 55 mcg dose daily as needed.  5. Torsemide 20 mg as needed.  She usually uses this when she notes      stomach swelling.  6. Xanax 0.25 mg daily as needed.  7. Fish oil 1200 mg capsule once daily.  8. Centrum kids with iron once daily.  9. Caltrate with vitamin D, once daily.  10.Vitamin D 1000 mg once daily.  11.Tylenol p.r.n.   CONDITION AT DISCHARGE:  Stable and improved.   DIET AT DISCHARGE:  Regular.      Azucena Freed, PA-C      Sandy Salaam. Deatra Ina, MD,FACG  Electronically Signed    SG/MEDQ  D:   08/04/2008  T:  08/04/2008  Job:  [id number]   cc:   Juanda Bond. Burt Knack, MD  Lowella Bandy. Olevia Perches, MD  Thom Chimes

## 2011-03-05 ENCOUNTER — Other Ambulatory Visit: Payer: Self-pay | Admitting: Internal Medicine

## 2011-03-05 ENCOUNTER — Telehealth: Payer: Self-pay | Admitting: *Deleted

## 2011-03-05 ENCOUNTER — Other Ambulatory Visit (INDEPENDENT_AMBULATORY_CARE_PROVIDER_SITE_OTHER): Payer: Medicare Other

## 2011-03-05 DIAGNOSIS — D649 Anemia, unspecified: Secondary | ICD-10-CM

## 2011-03-05 DIAGNOSIS — K921 Melena: Secondary | ICD-10-CM

## 2011-03-05 DIAGNOSIS — T887XXA Unspecified adverse effect of drug or medicament, initial encounter: Secondary | ICD-10-CM

## 2011-03-05 LAB — CBC WITH DIFFERENTIAL/PLATELET
Basophils Absolute: 0 10*3/uL (ref 0.0–0.1)
Basophils Relative: 0.4 % (ref 0.0–3.0)
Eosinophils Absolute: 0.1 10*3/uL (ref 0.0–0.7)
HCT: 30.2 % — ABNORMAL LOW (ref 36.0–46.0)
Hemoglobin: 10.3 g/dL — ABNORMAL LOW (ref 12.0–15.0)
Lymphs Abs: 1.4 10*3/uL (ref 0.7–4.0)
MCHC: 34 g/dL (ref 30.0–36.0)
Monocytes Relative: 6.6 % (ref 3.0–12.0)
Neutro Abs: 3.8 10*3/uL (ref 1.4–7.7)
RBC: 3.02 Mil/uL — ABNORMAL LOW (ref 3.87–5.11)
RDW: 13.9 % (ref 11.5–14.6)

## 2011-03-05 LAB — HEPATIC FUNCTION PANEL
AST: 32 U/L (ref 0–37)
Albumin: 3.6 g/dL (ref 3.5–5.2)
Total Protein: 6.7 g/dL (ref 6.0–8.3)

## 2011-03-05 NOTE — Telephone Encounter (Signed)
Patient advised that she is due for a CBC around 03/12/11. I have advised patient and she will come today for labs.

## 2011-03-05 NOTE — Telephone Encounter (Signed)
Message copied by Leone Payor on Wed Mar 05, 2011  1:45 PM ------      Message from: Delfin Edis      Created: Wed Mar 05, 2011  1:41 PM       Please call pt with stable H/H, repeat in 2 months

## 2011-03-05 NOTE — Telephone Encounter (Signed)
Left message on machine that labs are stable and that we will call her in 2 months to remind her to have labs rechecked. Labs in [facility] and note to remind patient

## 2011-03-05 NOTE — Telephone Encounter (Signed)
Message copied by Dixon Boos on Wed Mar 05, 2011  8:16 AM ------      Message from: Dixon Boos      Created: Mon Feb 03, 2011  8:37 AM       Needs cbc around 03/12/11 since pt had cbc 12/12/10 by norins.            ----- Message -----         From: Dixon Boos, [place]: 02/03/2011           To: Dixon Boos            Patient needs repeat cbc around 02/10/11. See append to 11/11/10 labs

## 2011-03-06 ENCOUNTER — Other Ambulatory Visit: Payer: Self-pay | Admitting: Internal Medicine

## 2011-03-06 DIAGNOSIS — T887XXA Unspecified adverse effect of drug or medicament, initial encounter: Secondary | ICD-10-CM

## 2011-03-06 DIAGNOSIS — K921 Melena: Secondary | ICD-10-CM

## 2011-03-07 NOTE — Procedures (Signed)
[place] [facility]   SHASHA, BUCHBINDER                      MRN:          [id number]  DATE:05/07/2006                            DOB:          01-Jul-1936    READING PHYSICIAN:  Lowella Bandy. Olevia Perches, MD   PROCEDURE:  Upper abdominal ultrasound.   INDICATION:  Epigastric pain.   RESULTS:  Abdominal aorta 16 x 14.7 mm.  Inferior vena cava patent.   The pancreas appears normal throughout the head, body and tail without  evidence of ductal dilatation, pancreatic masses, or peripancreatic  inflammation.   Gallbladder is well distended, thin walled, with no pericholecystic fluid or  intraluminal echogenic foci to suggest gallstone disease.   The common bile duct measures 5.2 mm in maximal diameter without evidence of  intraluminal foci.   The liver appears normal without evidence of parenchymal lesion, ductal  dilatation or vascular abnormality.   Kidneys are normal in appearance, right 83 mm, left 97 mm.   Spleen is 49 x 87 mm with 10-mm cyst.   IMPRESSION:  Normal upper abdominal ultrasound of the right upper quadrant,  liver, gallbladder and pancreas.                                   Lowella Bandy. Olevia Perches, MD   DMB/MedQ  DD:  05/07/2006  DT:  05/08/2006  Job #:  [id number]

## 2011-03-07 NOTE — H&P (Signed)
NAME:  Paula Ward, Paula Ward                         ACCOUNT NO.:  [id number]   MEDICAL RECORD NO.:  [id number]                   PATIENT TYPE:  INP   LOCATION:  1827                                 FACILITY:  [facility]   PHYSICIAN:  Harden Mo, M.D.               DATE OF BIRTH:  09/28/1936   DATE OF ADMISSION:  08/10/2002  DATE OF DISCHARGE:                                HISTORY & PHYSICAL   CHIEF COMPLAINT:  Legs swollen and short of breath.   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  The patient has  had CREST for years and is followed by Dr. Marshall Cork. Zieminski.  She has been  followed for the past two years by Dr. Legrand Como B. Wert for pulmonary  hypertension.  Dr. Christena Deem. Wert recently referred her to [facility] for further  evaluation and treatment.   She was hospitalized here on March of this year for a pulmonary embolus, a  source unknown, and has been on Coumadin since then.  She denies any chest  pain, pressure, or tightness.  She has no history of congestive heart  failure in the past and no history of cardiac disease and has never seen a  cardiologist.   PAST SURGICAL HISTORY:  She had a tonsillectomy in her youth.  Appendectomy  in 1963.  Lumpectomy from the left breast due to breast cancer in 1996 by  Dr. Haywood Lasso.  This was followed by radiation and she took  TomoxafinTamoxifen years.   PAST MEDICAL HISTORY:  She was hospitalized for the above-mentioned  pulmonary embolus in March of 2003.  She has Raynaud's phenomenon which is  followed by Dr. Anson Oregon.  GERD.  Depression and hypertension since  March of 2003.   CURRENT MEDICATIONS:  1. Lexapro 5 mg 1 q.d.  2. Norvasc 10 mg 1 q.d.  3. Coumadin 5 mg 1/2 per day on  all days of the week except for Saturday     when she takes 1 tablet.  4. Prevacid 30 mg 1 q.d.   ALLERGIES:  None.   FAMILY HISTORY:  Mother died at age 8 with Alzheimer's disease.  Father  died at age 54 with a kidney infection and also kidney cancer.  She has a  sister who has diabetes, obesity, and back problems.  Son and daughter are  in good health.   SOCIAL HISTORY:  She is married.  Lives with her husband.  Lives in a single  story dwelling with two steps to get into the house.  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  She  has never used tobacco or alcohol.   REVIEW OF SYMPTOMS:  She has felt chilled.  She has had some loose stools.  She had pneumonia about a year ago.  She denies any other systemic, skin,  eye, ENT, respiratory, cardiovascular, GI, GU, musculoskeletal, neurological  complaints.   PHYSICAL EXAMINATION:  VITAL SIGNS:  Blood pressure 182/107, pulse 105,  respirations 28, temperature 97.1.  O2 saturation on room air 98%.  GENERAL:  She is comfortable at rest but short of breath with any exertion.  She is alert and oriented.  SKIN:  Shows many spider hemangiomas scattered over her trunk, arms, legs,  neck, and face.  Otherwise skin was clear, warm, and dry.  HEENT:  Eyes:  Pupils are equal, round, and reactive to light.  Full EOMs.  Fundi are benign.  Disk are flat.  Sclerae are nonicteric.  ENT:  TMs are  normal.  No intraoral lesions.  Moist mucus membranes are moist.  Pharynx is  clear.  NECK:  Supple.  No adenopathy or bruit.  There is JVD to the angle of the  jaw 90 degrees.  Thyroid is not enlarged.  LUNGS:  Resonant to percussion but has rales the lower two-thirds of both  lungs.  No wheezes or rhonchi.  HEART:  Regular rhythm.  No murmur.  Does have an S3 gallop.  No S4.  BREASTS:  Left breast is indurated and sclerotic.  There is no discreet mass  nor tenderness.  Right breast is normal.  No  axillary adenopathy.  ABDOMEN:  Soft and nontender without organomegaly or mass.  Bowel sounds  normally active.  RECTAL:  No masses.  Heme negative stool.  EXTREMITIES:  There is pitting edema of both legs extending from the ankles  up to the hips.  Pedal pulses are full.  NEUROLOGICAL:  Alert and oriented x 3.  Speech is clear and appropriate.  No  extremity weakness or tremor.  DTRs are 2+ and symmetric.  Babinski's are  downgoing.  Cranial nerves intact.   LABORATORY DATA:  CBC shows hemoglobin 13.1, white count 9800.  BNP is 2910.  INR was elevated at 5.6.  CMET shows a sodium of 138, potassium of 3.6.  Glucose 11, BUN 31, creatinine 1.1.  CPK 118, MB is 6.8, index 5.8, troponin  is normal at 0.04.   Chest x-ray shows cardiomegaly, small pleural effusions, and increased as  per markings.  EKG shows right axis deviation and right bundle branch block.   IMPRESSION:  1. Congestive heart failure secondary to pulmonary hypertension.  2. 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  3. Calsclerodactylyud disease, esophageal dysmotility, sclerodactylly, and     telangiectasia syndrome.  4. Histosclerodactylyy embolus.  5. Hypertension.  6. Gastroesophageal reflux disease.  7. Raynaud's phenmeonon.  8. History of breast cancer.  9. Depression.  phenomenon  1. IV Lasix.  2. Altace and Toprol p.o.  3. 2-D echocardiogram.  4.     Spiral chest CT.  5. Hold the Coumadin for now.  6. Pulmonary consult.                                               Harden Mo, M.D.    DCK/MEDQ  D:  08/10/2002  T:  08/10/2002  Job:  [id number]   cc:   Christena Deem. Melvyn Novas, M.D. [facility]   Jeneen Rinks D. Juventino Slovak, M.D.   Anson Oregon, M.D.  [address] [place]  [place] [address]  Fax: [phone]

## 2011-03-07 NOTE — Cardiovascular Report (Signed)
NAME:  Paula Ward, Paula Ward                         ACCOUNT NO.:  [id number]   MEDICAL RECORD NO.:  [id number]                   PATIENT TYPE:  INP   LOCATION:  7026                                 FACILITY:  [place]   PHYSICIAN:  Jenkins Rouge, MD [facility]                DATE OF BIRTH:  02-20-1936   DATE OF PROCEDURE:  DATE OF DISCHARGE:                              CARDIAC CATHETERIZATION   PROCEDURE:  Right and left heart catheterization.   INDICATIONS FOR PROCEDURE:  Pulmonary hypertension.   PROCEDURAL NOTE:  Standard catheterization was done from the right femoral  artery and vein.  Getting a Swan-Ganz catheter out of the patient's  pulmonary artery was extremely difficult.  Two different thermodilution  catheters were used with an 0.25 Swan wire.  We also used an S-tip  thermodilution catheter.   FINDINGS:  1. The left main coronary artery was normal.  2. The left anterior descending artery was normal.  3. The circumflex coronary artery was normal.  4. The right coronary artery was dominant and normal.  5. RA ventriculography:  RAO ventriculography showed abnormal septal motion     with mild global hypokinesis.  EF was 55%.  There was no MR.  6. Aortic pressure was 142/93.  7. LV pressure was 144/11.  8. Mean right atrial pressure was 24 mmHg.  9. RV pressure was 75/11 with a post A wave EDP of 26.  10.      PA pressure was 79/35 with a mean of 53.  11.      Mean pulmonary capillary wedge pressure was 16.  12.      PAD was significantly higher than pulmonary capillary wedge     pressure indicating intrinsic lung disease.  13.      The patient's PA systolic pressure was greater than one-half her     systemic systolic pressure indicating severe pulmonary hypertension as     well.  14.      PA saturation was 69.6.  Arterial saturation was 95.5.  15.      Cardiac output was 4.1 with a cardiac index of 2.2.  16.      Calculated pulmonary vascular resistance was extremely elevated at     722.   IMPRESSION:  The patient would appear to have significant primary pulmonary  hypertension.  This may be related to her radiation therapy, CREST syndrome,  or idiopathic.  She has no significant left-sided disease or coronary artery  disease.  She will have her Coumadin resumed in hospital.  She will need  home oxygen.  I suspect digoxin for RV inotropy may benefit her.  She will  then follow up with Dr. Melvyn Novas and  Dr. Gaynell Face.  She will be a candidate for DeSotan and/or Flolan.   I suspect that the prostacyclin and endothelin vasodilators would be the  only drugs that would benefit her dyspnea significantly.  Further recommendations would be coming from the pulmonary division.                                               Jenkins Rouge, MD [facility]    PN/MEDQ  D:  08/18/2002  T:  08/18/2002  Job:  [id number]   cc:   Christena Deem. Melvyn Novas, M.D. [facility]   Thom Chimes

## 2011-03-07 NOTE — Consult Note (Signed)
NAME:  Paula Ward, Paula Ward                         ACCOUNT NO.:  [id number]   MEDICAL RECORD NO.:  [id number]                   PATIENT TYPE:  INP   LOCATION:  4731                                 FACILITY:  [facility]   PHYSICIAN:  Christena Deem. Melvyn Novas, M.D. [facility]           DATE OF BIRTH:  03-16-1936   DATE OF CONSULTATION:  08/11/2002  DATE OF DISCHARGE:                                   CONSULTATION   REASON FOR CONSULTATION:  Dyspnea/leg swelling.   HISTORY OF PRESENT ILLNESS:  This is an exceptionally complex 75 year old  white female with chronic dyspnea on exertion associated with very minimal  interstitial lung disease felt to be due to scleroderma.  I was originally  concerned about pulmonary hypertension and performed an echocardiogram on  this patient when I first evaluated her in 1999, but found no evidence of  pulmonary hypertension.  She then had a relatively abrupt onset of dyspnea  on December 28, 2001, associated with marked elevation of RV systolic  pressures.  Subsequently this led to a CT scan indicating that she has  pulmonary emboli.  Since March of 2003, she has been on Coumadin, but has  plateaued in terms of response to therapy with multiple complaints, many  of which were felt to be partially functional in nature.  She complained of  being weak and hurting in the legs when I saw her on August 01, 2002, but  she did not complain of any swelling of the legs and I did not detect any  edema clinically.  However, over the ensuing week she had increasing  swelling in the legs associated with increasing dyspnea with activity,  generalized weakness, and persistent acute on chronic hacking upper airway  cough that we had previously attributed to reflux, but was associated with a  few crackles on inspiration on her last exam, typical of interstitial  fibrosis.   I strongly recommended that this patient be seen at this point at a  pulmonary hypertension center because her most recent  CT scan performed on  July 21, 2002, at [facility] did not reveal any  evidence of residual thrombotic disease.  Now on echocardiogram performed on  July 21, 2002, she had evidence of right ventricular dilatation, which was  new (despite reduction in PA systolic pressures).  I spent most of her last  visit actually discussing the need and indications for referral to [facility] (the feeling being that we had done all we could  to reverse the thromboembolic portion of her pulmonary hypertension and that  she was developing despite this evidence of RV dilatation indicating a  primary source for pulmonary hypertension).   Since admission, her leg swelling is better and her dyspnea is better on  oxygen.  She has had a repeat CT scan that shows no evidence of acute clot.   PAST MEDICAL HISTORY:  1. Scleroderma previously  diagnosed with CREST with multiple telangiectasias     for over 10 years.  2. Status post appendectomy.  3. Breast cancer, status post left partial mastectomy remotely.   ALLERGIES:  None known.   MEDICATIONS:  1. Prevacid 30 mg b.i.d.  2. Norvasc 5 mg daily.  3. Coumadin per [facility].  4. Multivitamins one daily.   SOCIAL HISTORY:  She has never smoked.  She denies any passive smoke  exposure.  She had worked as a Network engineer for [employer].  She denies any  significant alcohol history, unusual travel, pet, or hobby exposure.   FAMILY HISTORY:  Positive for cancer of the bladder in her mother and  possibly also cancer in her father, unknown cell type.  There is no history  of any clotting disorders.   REVIEW OF SYMPTOMS:  Taken in detail.  She has had no obvious dysphagia,  significant fevers, chills, or sweats, pleuritic or exertional chest pain,  or change in bowel or bladder habits.   PHYSICAL EXAMINATION:  GENERAL APPEARANCE:  This is a chronically ill-  appearing, elderly, white female who looks profoundly  depressed.  She sits  in a dark room with the covers pulled up and asks the same questions over  and over again without appearing to process the information given.  VITAL SIGNS:  She is afebrile with normal vital signs.  HEENT:  Unremarkable.  The pharynx is clear.  She had typical steroid  facies.  NECK:  Supple without cervical adenopathy, tenderness, or thyromegaly.  LUNGS:  The lung fields reveal a few crackles on inspiration with no cough  in inspiratory or expiratory maneuvers.  HEART:  There is regular rate and rhythm, although there is definite  increase in pulmonic S2.  ABDOMEN:  Soft and benign with no palpable organomegaly or masses.  I do not  appreciate any ascites.  EXTREMITIES:  Warm without calf tenderness, cyanosis, or clubbing.  There  was 1-2+ pitting from the knees down bilaterally.  NEUROLOGIC:  No focal deficits or pathologic reflexes.  SKIN:  Warm and dry.   LABORATORY DATA:  The hemoglobin saturation is in the upper 90s on 2 L.  CT  scan as above shows no evidence of definite pulmonary acute clots.  There is  a moderate to large pericardial effusion mentioned.  There is mild diffuse  interstitial change.  Her INR at admission was 6.  Her BNP was 2910.  She  had a normal chemistry profile, except for a potassium of 2.8.  The serum  albumin was 3.5.   IMPRESSION:  Severe right heart failure in this patient with severe  pulmonary hypertension chronically.  I had originally hoped that most of her  thromboembolic pulmonary hypertension was in fact related to clotting and  that six months of Coumadin would eliminate or improve her pulmonary  hypertension.  She still could have an element of thromboembolic pulmonary  hypertension that we are missing by spiral CT scan, but I believe the main  problem here is progressive scleroderma with secondary pulmonary hypertension.  A differential diagnosis does include paraneoplastic syndrome  because she has a history of breast  cancer, but there is nothing to indicate  breast cancer otherwise.   The options are really quite limited here and I believe she might be a  candidate for Bosartan, but because she is so complicated, I do believe she  needs to be seen by Thom Chimes, M.D., at the Pulmonary Hypertension  Center at [facility].  In the meantime, I recommend the  following:  1.  Add Aldactone to her present regimen of Lasix and continue  oxygen empirically at 2 L/min.  2.  Use Cardizem as the antihypertensive of  choice since it has been show beneficial in patients with pulmonary  hypertension, albeit at higher doses than we will likely be able to use  here.  3.  I also sense that this patient has a hopeless/helpless affect and  attitude that is working against her at this point and would recommend  increase in her Lexapro up to 10 mg per day and in fact even doubling that  if she does not improve.  4.  She has a chronic upper airway cough that is  probably related to reflex from scleroderma.  I recommended increasing  Prevacid up to 30 mg b.i.d.                                              Legrand Como B. Melvyn Novas, M.D. [facility]   MBW/MEDQ  D:  08/12/2002  T:  08/13/2002  Job:  [id number]   cc:   Nelda Severe. Juventino Slovak, M.D.

## 2011-03-07 NOTE — Discharge Summary (Signed)
NAMEKIARI, HOSMER               ACCOUNT NO.:  [id number]   MEDICAL RECORD NO.:  [id number]          PATIENT TYPE:  INP   LOCATION:  [place]                         FACILITY:  [facility]   PHYSICIAN:  Heinz Knuckles. Norins, MD  DATE OF BIRTH:  08/24/1936   DATE OF ADMISSION:  10/16/2006  DATE OF DISCHARGE:  10/19/2006                               DISCHARGE SUMMARY   ADMITTING DIAGNOSIS:  1. 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  2. Pneumonia.  3. CREST syndrome.   DISCHARGE DIAGNOSES:  1. Anemia secondary to arteriovenous malformation bleed, stable.  2. Pneumonia, improved.  3. CREST syndrome.   CONSULTANTS:  Malcolm T. Fuller Plan, MD for GI.   PROCEDURE:  Chest x-ray at admission 10/16/2006 with improving right  upper lobe pneumonia, cardiac enlargement with no failure, chronic  interstitial lung disease.   HISTORY OF THE PRESENT ILLNESS:  Ms. 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 Delfin Edis as an outpatient.  The patient  recently has been treated as an outpatient for a right upper lobe  pneumonia with antibiotics.  She subsequently developed diarrhea; and  described having dark, blackish stools that lasted for several days, but  then returned to normal.  She had no hematemesis.  She had no melena or  hematochezia.  The patient had actually been seen in the office on the  day of admission; and was doing better from a pneumonia perspective.  She went to the [facility] for routine ProTime; and was found at  that time to be anemic with a hemoglobin of 7.9.  She was subsequently  referred to the hospital for admission for IV transfusion.  Please see the H&P for past medical history, family history, social  history and current medications.   PHYSICAL EXAM:  VITAL SIGNS:  At admission significant for temperature  97.7, blood pressure 131/67, heart  rate was 104.  GENERAL:  This is a well-nourished, well-developed, alert woman in no  distress.  CHEST:  Showed bibasilar crackles, otherwise clear.  CARDIAC EXAM:  Unremarkable except for tachycardia.  ABDOMEN:  Nontender, nondistended with active bowel sounds.   ADMITTING LABORATORY:  Revealed a white count of 10,700; hematocrit was  23.7%; platelet count was 508,000.  Chemistries were unremarkable.   HOSPITAL COURSE:  Problem #1:  ANEMIA.  The patient admitted with anemia  probably secondary to AVM/GI bleed.  She was typed, crossed, and  transfused 2 units of blood.  She had improvement in her hemoglobin from  7.9 to 10.3 grams.  On the 30th her hemoglobin was sent to 11.2 grams;  on the day of discharge her hemoglobin was 10.5 grams and thought to be  stable.  The patient reports she has had no signs of further bleeding  with no more dark stools.  She had had heme-positive stool; however,  which may have been from residual blood in the stool.  The patient was  hemodynamically stable.  She was seen in consultation during her  hospitalization by Dr. Lucio Edward.  He did not feel that the patient  needed to have any further evaluation as long as her hemoglobin  stabilized.  With the patient having a stable hemoglobin, she is now  felt to be ready for discharge.   Problem #2:  PNEUMONIA.  The patient had been treated as an outpatient  for an outpatient for pneumonia.  She had been found in the emergency  department with a rounded right upper lobe infiltrate.  Followup chest x-  ray did show improvement.  The patient was instructed to continue her  antibiotics at home, and to complete a full course of therapy.  She will  be seen in the office on January 3 for followup on her pneumonia as well  as to recheck her hemoglobin.  Until she is seen in followup, she is to  hold her Coumadin.   DISCHARGE EXAMINATION:  VITAL SIGNS:  Temperature of 97.8, blood  pressure 111/60, heart rate 74,  respirations 20.  Oxygen saturation is  96% on room air.  GENERAL APPEARANCE:  A well-nourished, well-developed woman who is in no  distress.  CHEST:  Patient is moving air well.  CARDIOVASCULAR:  2+ radial pulse.  Her precordium was quiet.  She had a regular rate.  ABDOMEN: Soft and nontender.  No further exam conducted.  Final  laboratory hemoglobin as stated.  The patient had a comprehensive  metabolic panel at admission which was unremarkable with a creatinine of  1.2, normal electrolytes, normal liver functions.  INR at admission was  1.2.   DISCHARGE MEDICATIONS:  The patient will resume all of her home  medications including:  1. Aciphex 20 mg p.o. b.i.d.  2. Diltiazem at her previous dose.  3. HOLD Coumadin until seen.  4. Nasacort 1 spray to each nostril daily.  5. Furosemide at previous home dose.  6. Spironolactone 25 mg daily.  7. Tracleer 125 mg p.o. b.i.d.  8. __________ 10 mg inhaled q.3 h. p.r.n.  9. Ceftin 250 mg b.i.d. with the patient to complete her home supply.   DISPOSITION:  The patient is discharged home.  She is to see [name]  on Thursday January 3, already scheduled.  She will follow up Dr. Gaynell Face  at [facility] as previously instructed.   PATIENT'S CONDITION AT TIME OF DISCHARGE DICTATION:  Stable and  improved.      Heinz Knuckles Norins, MD  Electronically Signed     MEN/MEDQ  D:  10/19/2006  T:  10/19/2006  Job:  [id number]   cc:   [facility]   Lowella Bandy. Olevia Perches, [place]  [place] [address]

## 2011-03-07 NOTE — Discharge Summary (Signed)
NAME:  Paula Ward, Paula Ward                         ACCOUNT NO.:  [id number]   MEDICAL RECORD NO.:  [id number]                   PATIENT TYPE:  INP   LOCATION:  4731                                 FACILITY:  [facility]   PHYSICIAN:  Christena Deem. Melvyn Novas, M.D. [facility]           DATE OF BIRTH:  [birth year]   DATE OF ADMISSION:  08/10/2002  DATE OF DISCHARGE:  08/19/2002                                 DISCHARGE SUMMARY   FINAL DIAGNOSES:  1. Cor pulmonale with acute decompensation with severe leg swelling.  2. Severe pulmonary hypertension felt to be part of the calcinosis, Raynaud     disease, esophageal dysmotility, sclerodactyly, and telangiectasia     syndrome with acute deterioration for the last 6 months related to     thromboembolic documented multiple pulmonary emboli.  3. Chronic cough felt to be secondary to reflux related to esophageal     involvement of scleroderma.  4. Depression/anxiety.  5. Longstanding calcinosis, Raynaud disease, esophageal dysmotility,     sclerodactyly, and telangiectasia syndrome complicated by Raynaud's     syndrome previously on Norvasc.  6. Acute prerenal azotemia this admission, resolved, with a creatinine of     1.3 and a BUN of 16 prior to discharge.  7. Excessive anticoagulation at the time of admission most likely due to     hepatic congestion.  We will therefore need to be very careful with the     use of Coumadin in this setting.   HISTORY AND HOSPITAL COURSE:  The patient is an exceptionally complicated 75-  year-old white female with a history of CREST syndrome with no documented  pulmonary hypertension until she developed acute pulmonary emboli in March  2003.  I had hoped that treatment of the thromboembolic disease would  improve her pulmonary hypertension and it initially appeared to be helping  but then on most recent echocardiogram from the office in early October she  developed dilated RV which is a new finding.  At that point I began talking  to her about seeing Dr. Gaynell Face at [facility].  She initially declined the  referral.  She was admitted to the hospital with acute decompensation on  August 10, 2002 with leg swelling and worsening dyspnea.   She had not had significant leg swelling but I understand that she was  placed on Norvasc for Raynaud's syndrome and had marked worsening of leg  swelling while on Norvasc, which was actually increased prior to her  admission.   I stopped the Norvasc and tried to diurese her but she became prerenal when  her leg swelling was completely resolved and also became presyncopal.   At that point I backed off of the diuretics and had cardiology see her  because of a concern of pericardial effusion.  She underwent left and right  heart catheterization on August 18, 2002 indicating a mean PA pressure of  53 with a wedge pressure of  60 and a cardiac output of 4.1 with a calculated  pulmonary vascular resistance of 722.  All of her coronary arteries were  normal.  She had no evidence of mitral regurgitation or shunt.  She had mild  global hypokinesis with an ejection fraction of 55%.   She therefore is felt to have predominantly right heart failure and needs  referral to [facility] for consideration for Losartan or IV prostacyclin depending  on her response to therapy.  She will be discharged on the following  medications in improved condition.   DISCHARGE MEDICATIONS AND INSTRUCTIONS:  1. Prevacid 30 mg one b.i.d. before meals.  2. Cardizem CD 240 mg daily.  3. Lexapro 10 mg one daily.  4. Spironolactone/hydrochlorothiazide 25/25 one b.i.d.  5. Coumadin dosed per the pharmacy here and per the Coumadin clinic with a     target INR between 2.0 and 3.0 noting that she did have excessive INR on     admission.  6. For leg swelling she can use Demadex 20 mg one daily p.r.n.   We documented a saturation as low as 88% sleeping prior to discharge and  therefore, I am going to discharge her on 2 L/min 24  hours a day feeling  that given the severity of her pulmonary hypertension we cannot afford any  hypoxemia.   She is to return to see me in a week and will see the Coumadin Clinic on  August 22, 2002 to have her Coumadin adjusted.                                               Christena Deem. Melvyn Novas, M.D. [facility]    MBW/MEDQ  D:  08/19/2002  T:  08/19/2002  Job:  [id number]   cc:   Nelda Severe. Juventino Slovak, M.D.

## 2011-03-07 NOTE — Assessment & Plan Note (Signed)
[facility]                                 ON-CALL NOTE   HOLLEY, KOCUREK                      MRN:          [id number]  DATE:10/10/2006                            DOB:          1936/10/05    TELEPHONE NUMBER:  [phone]   CALLER:  The phone call came from a pharmacist. [name] wrote for  Imodium in a dose of what normally would be Lomotil, and the pharmacist  asked for clarification.   PLAN:  Since Imodium is over-the-counter, it is fairly clear he probably  meant to write for Lomotil and wrote Imodium by mistake, so we told the  pharmacist to go ahead and fill the prescription with the appropriate  dose of Lomotil.     Venia Carbon, MD  Electronically Signed    RIL/MedQ  DD: 10/10/2006  DT: 10/11/2006  Job #: [id number]   cc:   Heinz Knuckles. Norins, MD

## 2011-03-07 NOTE — H&P (Signed)
NAMEJOYLYN, DUGGIN NO.:  [id number]   MEDICAL RECORD NO.:  [id number]          PATIENT TYPE:  EMS   LOCATION:  ED                           FACILITY[facility]  [facility]   PHYSICIAN:  Titus Mould, MD     DATE OF BIRTH:  04-Sep-1936   DATE OF ADMISSION:  10/16/2006  DATE OF DISCHARGE:                              HISTORY & PHYSICAL   PRIMARY CARE PHYSICIAN:  Heinz Knuckles. Norins, M.D.   CHIEF COMPLAINT:  Abnormal blood levels.   HISTORY OF PRESENT ILLNESS:  Ms. Tobey is a 75 year old female with a  history of anemia, colonic AV malformation, gastritis, who presented to  the emergency department after being called by the Coumadin Clinic  secondary to a low hematocrit.  The patient was given antibiotics for a  pneumonia on October 06, 2006, had subsequent diarrhea that she  describes as dark.  This lasted until a couple of days ago, now having  normal bowel movements with no melena.  The patient has had no  complaints of bright red blood per rectum, no hematemesis, no coffee  ground emesis.  She has no complaints of abdominal discomfort.  She has  had no syncope or presyncope.  She does complain of fatigue.  She has  had no worsening shortness of breath, no PND or orthopnea, no lower  extremity swelling.  She has no complaints of palpitations, no chest  pain.   PAST MEDICAL HISTORY:  1. CREST syndrome.  2. Breast CA, status post left partial mastectomy.  3. History of pulmonary embolus.  4. Pulmonary hypertension.  5. 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  6. Gastritis.  7. Status post appendectomy.  8. Hypertension.  9. Chronic GI blood loss.   MEDICATIONS:  1. Aciphex 20 mg p.o. b.i.d.  2. Diltiazem 120 mg p.o. daily.  3. Coumadin.  4. Nasacort.  5. Torsemide.  6. Aldactone.  7. Tracleer 125 mg b.i.d.  8. Ventavis 10 mcg inhaled q.3 h. p.r.n.   ALLERGIES:  ERYTHROMYCIN.   SOCIAL HISTORY:  The patient is married, lives in [place], denies  tobacco or alcohol  abuse.   FAMILY HISTORY:  Mother died at age 76 with Alzheimer disease.  Father  died at age 45 with bladder cancer.   REVIEW OF SYSTEMS:  All systems reviewed in detail and negative except  as noted in the history of present illness.   PHYSICAL EXAMINATION:  VITAL SIGNS:  Temperature 97.7, blood pressure  131/67, heart rate 104, respiratory rate 18, oxygen saturation 98% on  room air.  GENERAL:  Well-developed, well-nourished female, alert and oriented x3,  in no apparent distress.  HEENT:  Atraumatic, normocephalic.  Pupils are equal, round, and  reactive to light.  Extraocular movements intact.  Oropharynx clear.  NECK:  Supple.  No adenopathy.  No JVD.  No carotid bruits.  CHEST:  Bibasilar crackles.  Otherwise clear.  Equal bilaterally.  CORONARY:  Tachycardic.  Regular.  No murmurs, rubs, or gallops.  Peripheral pulses 2+.  ABDOMEN:  Soft, nontender, nondistended.  Active bowel sounds.  No  hepatosplenomegaly.  EXTREMITIES:  No  clubbing, cyanosis, or edema.  NEUROLOGIC:  No focal deficits.   LABORATORY:  White blood cell count 10.7, hematocrit 23.7, 31.8 on  October 07, 2006, platelet count 508, MCV 96.  Sodium 136, potassium  4.6, chloride 104, bicarb 26, BUN 31, creatinine 1.2, glucose 98.  Total  bilirubin 0.7, alk phos 61, SGOT 37, SGPT 19, total protein 6.6, albumin  3.1, calcium 9.2.  INR 1.2, PTT 38.   IMPRESSION/PLAN:  Ms. Zenk is a 75 year old female who presents with  anemia and gastrointestinal bleeding.   PLAN:  1. Pulmonary.  Continue the patient on home dose of Tracleer and      Ventavis.  Follow up a PA and lateral chest x-ray from the      emergency department.  2. Cardiovascular.  Place the patient on a telemetry bed, hold      diltiazem, hold diuretics.  3. Gastroenterology.  Guaiac all stools.  B.i.d. PPI.  4. Hematologic.  Transfuse 2 units of packed red blood cells, follow      up hematocrit in the morning, hold Coumadin.  5. Fluids,  electrolytes, nutrition.  Regular diet, BMP in the morning.  6. The patient is a fall risk given her anemia.  7. We will ensure that the patient has two working peripheral IVs.      Titus Mould, MD  Electronically Signed     TRK/MEDQ  D:  10/16/2006  T:  10/16/2006  Job:  [id number]

## 2011-03-07 NOTE — Discharge Summary (Signed)
[facility]  Patient:    Paula Ward, Paula Ward Visit Number: [id number] MRN: [id number]          Service Type: MED Location: 3W [phone] Attending Physician:  Arturo Morton Dictated by:   Christena Deem. Melvyn Novas, M.D. [facility] Admit Date:  01/06/2002 Discharge Date: 01/13/2002   CC:         Nelda Severe. Juventino Slovak, M.D.   Discharge Summary  FINAL DIAGNOSES: 1. Thromboembolic pulmonary hypertension secondary to an apparent chronic    pulmonary embolism possibly superimposed on pulmonary hypertension related    to scleroderma.    a. CT scan positive for clot in the right pulmonary artery on January 04, 2002.    b. Venous Dopplers negative this admission for evidence of deep venous       thrombosis. 2. Scleroderma followed by [place], M.D., complicated by chronic    gastroesophageal reflux disease. 3. Telangiectasias, previously diagnosed with "CREST syndrome," but now felt    to have full scleroderma. 4. Breast cancer, status post remote left partial mastectomy.  She last took    Tamoxifen over a year ago.  HISTORY OF PRESENT ILLNESS:  Please see the dictated H&P.  This patient is a very complicated 75 year old white female with dyspnea on exertion and minimal interstitial lung disease associated with a barium swallow with esophageal disease and there felt to have active scleroderma, but not under any chronic medical therapy for this other than Prevacid.  She has had increasing dyspnea since January of this year.  The work-up for this included an echocardiogram that suggested pulmonary hypertension.  A spiral CT scan was done to look at both any evidence for pulmonary embolism, as well as interstitial lung disease.  There was no mention of any increased interstitial lung disease over the baseline, but she did have a pulmonary embolism in the right pulmonary artery per Dr. Linton Rump at [facility].  HOSPITAL COURSE:  We had trouble contacting this  patient initially, but she eventually came to the hospital as requested on January 06, 2002.  She was placed on heparin and did quite well.  She had an entire hypercoagulability profile done that was negative, except for factor V Leiden which is pending. Specifically, she has had no evidence of anticardiolipin antibodies that are sometimes seen in collagen vascular diseases.  By the time of discharge, she was ambulating with no significant dyspnea.  She had been on heparin for seven days with three full days of overlap.  DISCHARGE MEDICATIONS:  She will be discharged on Coumadin 5 mg daily and followed in three days in our Coumadin clinic.  She will also continue PPI therapy in the form of Protonix 40 mg before breakfast daily.  ACTIVITY:  She will increase her activity as tolerated.  DIET:  Normal at the time of discharge.  CONDITION ON DISCHARGE:  Improved. Dictated by:   Christena Deem. Melvyn Novas, M.D. [facility] Attending Physician:  Arturo Morton DD:  01/13/02 TD:  01/14/02 Job: [id number]

## 2011-03-07 NOTE — Op Note (Signed)
NAME:  Paula Ward, Paula Ward                         ACCOUNT NO.:  [id number]   MEDICAL RECORD NO.:  [id number]                   PATIENT TYPE:  AMB   LOCATION:  ENDO                                 FACILITY:  [facility]   PHYSICIAN:  Delfin Edis, M.D. [facility]               DATE OF BIRTH:  07/21/1936   DATE OF PROCEDURE:  12/11/2003  DATE OF DISCHARGE:                                 OPERATIVE REPORT   INDICATION:  This 75 year old white female with CREST syndrome has had  chronic gastrointestinal blood loss which was documented on upper and lower  endoscopy in 2001 showing A-V malformation.  She has been on B12 as well as  iron supplements.  Her most recent iron saturation was 18.9%.  The  hemoglobin dropped, despite taking iron supplements, to 8.3; the last one 2  weeks ago in our office during her most recent visit was 10.2.  She was  strongly Hemoccult-positive.  She is undergoing upper and lower endoscopies  to assess the extent of the AVMs and also to rule out other etiologies.  She  has been on Coumadin.   ENDOSCOPE:  Olympus single-channel video endoscope.   SEDATION:  Versed 5 mg IV, fentanyl 50 mcg IV.   FINDINGS:  Olympus single-channel video endoscope was passed under direct  vision in through the posterior pharynx and into the esophagus.  Patient was  monitored by a pulse oximeter; her oxygen saturations were 93% to 97% on 2 L  of nasal oxygen.  Proximal and mid-esophageal mucosa was unremarkable.  At  the GE junction, on the gastric side, was a linear erosion which was  surrounded by erythema and bright red blood.  The bleeding was noted prior  to endoscope ever touching the mucosa, therefore, this was a spontaneous  hemorrhage.  Mucosa was somewhat hyperemic at the GE junction on the gastric  side, but no other erosions were seen.  Biopsies were taken from the GE  junction to rule out a Barrett's esophagus.  Visible through the  squamocolumnar junction was a small, easily reducible  hiatal hernia.   STOMACH:  Stomach was insufflated with air and showed essentially normal  gastric mucosa with a few areas of erythema in the gastric antrum and  prepyloric antrum.  Pyloric orifice itself has had some erosions in it, but  no active bleeding.  Retroflexion of endoscope revealed normal fundus and  cardiac; specifically, there was no A-V formation.   DUODENUM:  Duodenal bulb and descending duodenum were unremarkable.  No A-V  malformations were noted in the descending duodenum.  Endoscope was then  brought back into the stomach and biopsies were taken for CLOtest.   IMPRESSION:  1. 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  2. Status post biopsies of the gastroesophageal junction to rule out     Barrett's esophagus.  3. Minimal gastritis, status post CLOtest.   PROCEDURE:  Colonoscopy.  ENDOSCOPE:  Olympus single-channel video endoscope.   SEDATION:  Additional Versed 5 mg IV, fentanyl 75 mcg IV.   FINDINGS:  Olympus single-channel video endoscope was passed under direct  vision through the rectum to the sigmoid colon.  The patient was being  monitored by a pulse oximeter; her oxygen saturations were satisfactory.  Her prep was excellent.  Anal canal and rectal ampulla were unremarkable.  Starting in the left colon, at least 4 A-V malformations were noted in the  sigmoid and descending colon; these blanched with pressure and none of them  showed any stigmata of active bleeding.  They measured about 1 to 2 cm in  diameter; they were rather large.  Throughout the descending colon and  transverse colon, I was unable to see any A-V malformations.  The ascending  colon also was normal with the exception of 1 AVM.  Cecal pouch was normal  including ileocecal valve.  The colonoscope was then extracted.  No polyps  were found and no diverticulosis.   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   PLAN:  The patient has both A-V malformations as well as an erosion in the  GE junction which is most likely from acid reflux.  She is at high risk for  continued lower occult GI bleeding and therefore we need to reconsider her  using Coumadin because of her continuous need for not only iron but also  iron infusions and possible transfusions.  Judging from her disease, her A-V  malformations are likely to increase in quantity.  I suspect she has some in  the small bowel as well and at some point, the blood loss may exceed her  iron supplements.  This will be discussed with her primary physician, Dr.  Heinz Knuckles. Norins.                                               Delfin Edis, M.D. [facility]    DB/MEDQ  D:  12/11/2003  T:  12/11/2003  Job:  [id number]   cc:   Thom Chimes  [address] [address]  Fax: [phone]   Heinz Knuckles. Norins, M.D. [facility]

## 2011-03-07 NOTE — Consult Note (Signed)
NAME:  Paula Ward, Paula Ward NO.:  [id number]   MEDICAL RECORD NO.:  [id number]                   PATIENT TYPE:  INP   LOCATION:  4731                                 FACILITY:  [facility]   PHYSICIAN:  Jenkins Rouge, MD [facility]                DATE OF BIRTH:  05-11-1936   DATE OF CONSULTATION:  DATE OF DISCHARGE:                                   CONSULTATION   HISTORY OF PRESENT ILLNESS:  Paula Ward is an unfortunate 75 year old patient  followed by Paula Ward. She has an appointment to see Paula Ward at [facility]. She  has severe pulmonary hypertension. She has had a recent pulmonary emboli and  has been on chronic Coumadin. She also has CREST syndrome with some previous  chest radiation from breast cancer. She has multiple factors associated with  her pulmonary hypertension. We were asked to see her in regards to her right  heart failure.   The patient has no known previous coronary artery disease. She has had an  echocardiogram which showed classic cor pulmonale with RV dilatation. There  was a question of a small to moderate pericardial effusion on CT scanning.   The patient has been worked up by Paula Ward. She sees Paula Ward  chronically for her CREST syndrome. She has had a previous partial left  mastectomy with breast reconstruction for breast cancer.   She has not had any significant chest pain. She has had some presyncope. The  patient's primary complaint again is dyspnea. She does also have significant  lower extremity swelling from her right sided heart failure while in the  hospital. This has been diuresed well; however, her creatinine has increased  from 1.0 to 1.6. Her review of systems is otherwise remarkable for  significant depression. She is on Lexapro. She has had recent hypertension  in the left few months and was on Norvasc on admission. She has not had  complications from her chronic Coumadin therapy. INR was quite high on  admission in the 6  range.   The patient's BNP was elevated at 29.10, and I suspect this is more right  sided.   MEDICATIONS:  Her medications include Lexapro 5 mg a day, Norvasc 10 a day,  Coumadin as directed, Prevacid. She has been on diuretics here in the  hospital.   Apparently she has a cough with ACE inhibitors and has been placed on Cozaar  50 b.i.d.   Her current Lexapro dose has been increased to 10 mg a day. She is on  spironolactone 50 mg a day and Lasix 40 b.i.d.   PHYSICAL EXAMINATION:  VITAL SIGNS:  Her examination is remarkable for a  blood pressure of 160/80.  LUNGS:  Her lungs have decreased breath sounds at the bases.  NECK:  Her JVP is extremely elevated with  A & V waves near the earlobes.  HEART:  There is a  prominent S1. There is a prominent S2 pulmonary  component. She has a RV heave.  ABDOMEN:  Her abdomen is benign.  EXTREMITIES:  She actually does not have significant lower extremity edema  at this point.   LABORATORY DATA:  Her electrocardiogram shows sinus rhythm with incomplete  right bundle branch block and no acute changes.   IMPRESSION:  I do not think anything esoteric is going on here. Paula Ward  has multiple reasons for pulmonary hypertension. As far as I know, she has  not had decreased LV function, and given the fact that she has had  radiation, CREST syndrome, and PE, I am not sure there is a need to invoke  the diagnosis of constriction.   However, we will do a followup 2-D echocardiogram.   In regards to her overall prognosis, I think it is critical to actual  document the degree of her pulmonary hypertension, although she has an  appointment with Paula Ward on 09/26/02 at  [facility], I think it would be  reasonable to hold her Coumadin and perform a right and left heart  catheterization to further assess her hemodynamics. We will have to hold her  diuretics the day before, and hopefully, her creatinine will come down from  the current 1.6 level.   I think  that identifying the exact extent of her pulmonary hypertension and  possibly ruling out constriction by heart catheterization would be  reasonable. Further recommendations would be based on the results of her  echocardiogram and heart catheterization.                                               Jenkins Rouge, MD [facility]    PN/MEDQ  D:  08/15/2002  T:  08/15/2002  Job:  [id number]

## 2011-03-07 NOTE — Assessment & Plan Note (Signed)
[place]                 COUMADIN / CHRONIC HEART FAILURE CLINIC NOTE   ABRIA, VANNOSTRAND                      MRN:          [id number]  DATE:10/16/2006                            DOB:          Jan 03, 1936    [name] was seen in the anticoagulation clinic today for  general followup associated with her chronic anticoagulation therapy due  to history of PE DVT.  The patient states that she was evaluated in the  emergency department on December 17 due to elevated temperature.  She  was diagnosed with pneumonia and started on a Z-Pak.  She was evaluated  by [name] on October 09, 2006 at which time she was started on  Ceftin for a 7-day twice-daily course.  The patient's INR was obtained  in the ER on December 17, and found to be 5.0 and the patient was  instructed by [name] to be hold her Coumadin for December 17, 18 and  19 and then resume Coumadin therapy and return to clinic today for  scheduled followup.  The patient states she has had no obvious blood  loss and she denies dark, tarry bowel movements, other signs of GI  bleeding, or hemoptysis, epistaxis.  The patient's INR did not read on  the point of care whole blood test equipment today, thus she was sent  per standard protocol to the lab for PT, INR and CBC determination.  The  patient's INR was returned at 1.2, the PT was 13.5 seconds.  The  patient's hemoglobin and hematocrit were found to be 7.8 and 22.9  respectively, with a platelet count of 499, white blood cell count  elevated at 10.9.  Based on these findings, this case was discussed  extensively with Dr. Eustace Quail, supervising physician for today and  with the patient.  Previous CBCs obtained in the past several weeks  showed hemoglobins in the 9 and 10 range, so based on these findings it  has been suggested by Dr. Eustace Quail in consultation with the  patient's gastroenterologist, Dr. Delfin Edis, that she  be sent to the  emergency department for evaluation and potential transfusion for this  blood loss.  I contacted Dr. Cristie Hem Plotnikov who was in the [place] primary  care office at 1745 on December 28 ([name] was out of the office for  the day).  I discussed the case with him and we agreed that I would  contact the patient and ask her to present to the [facility] Emergency  Department and he would correspondingly state with Dr. Arnoldo Morale, who was  the primary care physician on call for the [facility].  This  information was relayed to the patient in 2 phone calls, one prior to  the decision for admission, the second after that time.  The patient  understood and stated that she would present to the emergency room this  evening.      [name], PharmD, BCPS, CPP  Electronically Signed      [name], MD, [facility]  Electronically Signed   MP/MedQ  DD: 10/18/2006  DT: 10/18/2006  Job #:  [id number]   cc:   Heinz Knuckles. Norins, MD

## 2011-03-07 NOTE — Assessment & Plan Note (Signed)
[place]                                 ON-CALL NOTE   LUXE, CUADROS                      MRN:          [id number]  DATE:10/16/2006                            DOB:          [birth year]    PHONE NUMBER:  [phone].   CARDIOLOGIST:  None.   PRIMARY CARE PHYSICIAN:  Heinz Knuckles. Norins, MD.   PULMONOLOGIST:  Christena Ward. Melvyn Novas, MD, [facility]   GASTROENTEROLOGIST:  Lowella Bandy. Olevia Perches, MD   HISTORY:  Paula Ward is a 75 year old female patient with a history of  pulmonary hypertension, cor pulmonale, Coumadin therapy secondary to a  history of DVT and pulmonary embolus, CREST syndrome, AV malformation,  and chronic GI blood loss, who was seen in our Coumadin clinic today.  She had an INR that could not be read by our fingerstick machine.  She  had a venipuncture sent off, and her hemoglobin returned at 7.8.  Paula Ward, clinical pharmacist, arranged for the patient to go to [facility] to be seen for potential transfusion with packed red blood  cells.  The patient was to be seen by Dr. Arnoldo Morale in the emergency room  at [facility].  The patient called the answering service because she  was at [facility] emergency room, and no one on staff knew the details  of her problem.   PLAN:  I spoke to Paula Ward and learned of today's events.  I  contacted the answering service to page Dr. Arnoldo Morale, and I instructed  the patient to go into the emergency room and let them know that Dr.  Arnoldo Morale was to see her there.   DISPOSITION:  As noted above.      Paula Dopp, PA-C  Electronically Signed      Deboraha Sprang, MD, [facility]  Electronically Signed   SW/MedQ  DD: 10/16/2006  DT: 10/16/2006  Job #: [id number]

## 2011-03-07 NOTE — H&P (Signed)
[facility]  Patient:    Paula Ward, UBER Visit Number: [id number] MRN: [id number]          Service Type: MED Location: 3W [phone] Attending Physician:  Arturo Morton Dictated by:   Christena Deem. Melvyn Novas, M.D. [facility] Admit Date:  01/06/2002   CC:         Nelda Severe. Juventino Slovak, M.D.   History and Physical  REFERRING PHYSICIAN:  Nelda Severe. Kindl, M.D.  CHIEF COMPLAINT:  Dyspnea.  HISTORY OF PRESENT ILLNESS:  This is a very complex, 75 year old, white female whom I originally saw at Dr. Nelda Severe. Kindls request on September 10, 1998, complaining of dyspnea on exertion that she dated back to onset of June of 1999 with very minimal interstitial lung disease, but history of dysphagia with evidence on barium swallow with scleroderma documented on May 30, 1999.  Since then, she has been followed by Anson Oregon, M.D., and had done reasonably well on empiric therapy for reflux until January of this year when she noticed the indolent onset of worsening dyspnea, but again only with activity, not at rest.  This was associated with a sensation of presyncope and mild discomfort in her chest.  She had not had any resting discomfort and no discomfort, except with heavy exertion.  She denied any nocturnal complaints of dyspnea, orthopnea, or PND and had no associated nausea, vomiting, or diaphoresis with the pain.  I did not feel on chest x-ray that she had any definite evidence of worsening interstitial lung disease, but I thought she might have pulmonary hypertension.  In fact, on CT scan on December 28, 2001, she had a definite change in pulmonary artery pressure up to 75.  While awaiting re-evaluation by Anson Oregon, M.D. (she has an appointment for January 10, 2002), she underwent a CT scan of the chest.  I was notified by Marylou Flesher, M.D., that this showed a definite 2 cm filling defect in the right pulmonary artery consistent with pulmonary embolism and  associated with an infarct along the right posterolateral lung base.  She did have peripleural infiltrative changes in the bases, but I do not have the old CT scan to compare to in terms of the interstitial evaluation, but apparently did not have the old CT scan done (my records indicate it was, however, done at [facility]) to compare to from December of 1999.  I contacted the patient and asked her to come to the emergency room immediately.  We placed her on IV heparin through the emergency room and admitted.  The patient denies any crescendo in terms of her symptoms since I saw her in the office on December 21, 2001.  She has had no further chest discomfort that is pleuritic pain.  No history of any hemoptysis, leg swelling, or previous DVT.  PAST MEDICAL HISTORY: 1. Significant for scleroderma as noted above. 2. Status post remote appendectomy. 3. Breast cancer, status post left partial mastectomy. 4. Telangiectasias dating back about "10 years" followed previously by    dermatology with a diagnosis of "CREST syndrome" made previously.  ALLERGIES:  None known.  MEDICATIONS:  Her only medication consists of Prevacid 30 mg daily.  Note that she has been on Tamoxifen previously, but according to my records has not taken it since August of 2002.  SOCIAL HISTORY:  She has never smoked.  Declines any passive smoke exposure. She has worked as a Network engineer for [employer].  She denies any significant alcohol history or  any unusual travel, pet, or hobby exposure.  FAMILY HISTORY:  Positive for cancer of the bladder in her mother and possibly also in her father of unknown type.  Denies any clotting disorders.  REVIEW OF SYSTEMS:  Taken in detail.  The patient continues to have no trouble swallowing while on Prevacid daily.  PHYSICAL EXAMINATION:  This is a pleasant white female with typical scleroderma facies.  VITAL SIGNS:  She is afebrile with normal vital signs.  HEENT:  Unremarkable.   The pharynx is clear.  NECK:  Supple without cervical adenopathy or tenderness.  The trachea is midline.  No thyromegaly.  LUNGS:  Lung fields perfectly clear bilaterally on auscultation and percussion.  HEART:  There is regular rate and rhythm with a definite increase in the pulmonic component of S2 and a resting pulse rate of around 80.  ABDOMEN:  Soft and benign.  No palpable organomegaly, masses, or tenderness.  EXTREMITIES:  Warm without calf tenderness, clubbing, cyanosis, or edema.  The Homans signs are negative bilaterally.  SKIN:  Warm and dry.  NEUROLOGIC:  No focal deficits or pathologic reflexes.  LABORATORY DATA:  Pending at the time of this dictation.  The only imaging study I have is the CT scan done on January 05, 2002, as mentioned above.  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  I believe that her scleroderma is actually relatively quiescent and the reason she has deteriorated recently has nothing to do directly with her scleroderma, but everything to do with occult thromboembolic disease.  I note that she previously was on Tamoxifen for breast cancer, which is probably the greatest risk factor for DVT and pulmonary embolism.  I did a hypercoagulable profile and will also consider doing a bone scan this admission to look for any evidence of metastatic disease.  A venous Doppler needs to be performed if for no other reason than to perform a baseline for future comparisons.  However, unless she has dramatic improvement in pulmonary hypertension, I believe she will need lifelong Coumadin.  My present recommendation is to stay on heparin for a week while we anticoagulate her and watch for any evidence of GI or other bleeding problems should they develop. Dictated by:   Christena Deem. Melvyn Novas, M.D. [place] Attending Physician:  Arturo Morton DD:  01/06/02 TD:  01/07/02 Job: [id number]

## 2011-03-07 NOTE — Assessment & Plan Note (Signed)
[facility]                           GASTROENTEROLOGY OFFICE NOTE   COLLIER, Paula Ward                      MRN:          [id number]  DATE:08/03/2006                            DOB:          May 26, 1936    Paula Ward is a 75 year old white female with CREST syndrome, AV  malformation, chronic GI blood loss.  She has had full GI workup, the last  time in February, 2005.  She had AV malformations on colonoscopy in the  past.  She is on intermittent iron orally, and she also has a history of  iron infusions.  She developed severe left-sided abdominal/chest pain  several weeks ago.  CT scan of the abdomen was done which showed  questionable thickening of the gastric wall.  It was not clear whether it  was due to the stomach being under-distended or whether there was actually a  mass.  The last endoscopy was done in February, 2005 and showed gastritis  and a small esophageal ulcer.  Her last hemoglobin was 10.4, hematocrit  31.4.  She has been erratic in taking her iron supplements.   PHYSICAL EXAMINATION:  VITAL SIGNS:  Blood pressure 110/52, pulse 80.  Weight 145 pounds.  HEENT:  She had telangiectasias throughout the skin and face.  LUNGS:  Clear to auscultation.  COR:  Normal S1 and S2.  ABDOMEN:  Soft.  The tenderness in the left side has resolved about 80-90%  with mild discomfort on the left side but no mass.  The liver edge at the  costal margin. Lower abdomen  was normal.  RECTAL:  Hemoccult-positive stool.   IMPRESSION:  A 75 year old white female with abnormal CT scan of the abdomen  suggestive of thickening of the gastric wall.  Last endoscopy was 2-1/2  years ago.  She has CREST syndrome and history of gastritis.   PLAN:  1. Upper endoscopy scheduled.  2. Continue AcipHex 20 mg a day.  3. Iron studies.  If low, she will need to increase her oral iron or get      iron infusion.       Lowella Bandy. Olevia Perches, MD    DMB/MedQ  DD:   08/03/2006  DT:  08/04/2006  Job #:  [id number]   cc:   Heinz Knuckles. Norins, MD

## 2011-03-07 NOTE — Assessment & Plan Note (Signed)
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  She was recently hospitalized with pneumonia was found to have a  Hemoglobin of 7.8 requiring blood transfusions.  She just completed an  iron infusion on September 28, 2006. She has had a documented chronic GI  blood loss.  She also has continued on oral iron supplement.  She blames  acute GI bleed on antibiotics given to her for pneumonia.  She is now  doing well.  Her level of energy is good.  She denies any visible blood  per rectum or  abdominal pain.  Her most recent hemoglobin on November 06, 2006, was 11.0, hematocrit 33.9 with MCV of 95.7.   PHYSICAL EXAM:  Blood pressure 82/52, pulse 84, weight 144 pounds.  She was alert, oriented.  She has AVMs of the skin.  LUNGS:  Clear to auscultation.  No wheezes or rales.  COR:  With normal S1, normal S2.  ABDOMEN:  Soft, nontender with normoactive bowel sounds.  RECTAL:  [name], heme-positive stool.  EXTREMITIES:  No edema.   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  Her hemoglobin is currently satisfactory but she needs to be  observed very closely for continued blood loss.   PLAN:  1. CBC and iron studies every 4 weeks.  2. Iron infusions periodically pending results of her CBC.  3. Continue AcipHex 20 mg p.o. b.i.d.  4. B-12 1000 mcg IM today.  5. Coumadin has to be continued but keep the INR low at around 2.     Lowella Bandy. Olevia Perches, MD  Electronically  Signed    DMB/MedQ  DD: 11/23/2006  DT: 11/23/2006  Job #: [id number]   cc:   Heinz Knuckles. Norins, MD

## 2011-03-13 ENCOUNTER — Other Ambulatory Visit (HOSPITAL_COMMUNITY): Payer: Self-pay | Admitting: Interventional Radiology

## 2011-03-13 DIAGNOSIS — N2889 Other specified disorders of kidney and ureter: Secondary | ICD-10-CM

## 2011-03-14 ENCOUNTER — Other Ambulatory Visit: Payer: Self-pay | Admitting: Internal Medicine

## 2011-03-14 ENCOUNTER — Encounter: Payer: Self-pay | Admitting: Internal Medicine

## 2011-03-14 ENCOUNTER — Telehealth: Payer: Self-pay | Admitting: Internal Medicine

## 2011-03-14 NOTE — Telephone Encounter (Signed)
Please mail copy of lab to Dr. Gaynell Face at [facility]

## 2011-04-03 ENCOUNTER — Other Ambulatory Visit: Payer: Self-pay | Admitting: Interventional Radiology

## 2011-04-03 ENCOUNTER — Encounter (HOSPITAL_COMMUNITY): Payer: Medicare Other

## 2011-04-03 ENCOUNTER — Other Ambulatory Visit (HOSPITAL_COMMUNITY): Payer: Self-pay | Admitting: Interventional Radiology

## 2011-04-03 ENCOUNTER — Ambulatory Visit (HOSPITAL_COMMUNITY)
Admission: RE | Admit: 2011-04-03 | Discharge: 2011-04-03 | Disposition: A | Discharge status: Home / Self Care | Payer: Medicare Other | Source: Ambulatory Visit | Attending: Interventional Radiology | Admitting: Interventional Radiology

## 2011-04-03 DIAGNOSIS — I517 Cardiomegaly: Secondary | ICD-10-CM | POA: Insufficient documentation

## 2011-04-03 DIAGNOSIS — J449 Chronic obstructive pulmonary disease, unspecified: Secondary | ICD-10-CM | POA: Insufficient documentation

## 2011-04-03 DIAGNOSIS — Z01812 Encounter for preprocedural laboratory examination: Secondary | ICD-10-CM | POA: Insufficient documentation

## 2011-04-03 DIAGNOSIS — J4489 Other specified chronic obstructive pulmonary disease: Secondary | ICD-10-CM | POA: Insufficient documentation

## 2011-04-03 DIAGNOSIS — Z01818 Encounter for other preprocedural examination: Secondary | ICD-10-CM | POA: Insufficient documentation

## 2011-04-03 DIAGNOSIS — Z01810 Encounter for preprocedural cardiovascular examination: Secondary | ICD-10-CM | POA: Insufficient documentation

## 2011-04-03 DIAGNOSIS — Z01811 Encounter for preprocedural respiratory examination: Secondary | ICD-10-CM

## 2011-04-03 DIAGNOSIS — N289 Disorder of kidney and ureter, unspecified: Secondary | ICD-10-CM | POA: Insufficient documentation

## 2011-04-03 LAB — CBC
HCT: 32.1 % — ABNORMAL LOW (ref 36.0–46.0)
MCHC: 31.5 g/dL (ref 30.0–36.0)
Platelets: 191 10*3/uL (ref 150–400)
RDW: 13.1 % (ref 11.5–15.5)
WBC: 5.8 10*3/uL (ref 4.0–10.5)

## 2011-04-03 LAB — PROTIME-INR: INR: 1.11 (ref 0.00–1.49)

## 2011-04-03 LAB — APTT: aPTT: 38 seconds — ABNORMAL HIGH (ref 24–37)

## 2011-04-03 LAB — BASIC METABOLIC PANEL
BUN: 29 mg/dL — ABNORMAL HIGH (ref 6–23)
Calcium: 9.7 mg/dL (ref 8.4–10.5)
Chloride: 105 mEq/L (ref 96–112)
Creatinine, Ser: 1.19 mg/dL (ref 0.4–1.2)
GFR calc Af Amer: 54 mL/min — ABNORMAL LOW (ref >60)

## 2011-04-11 ENCOUNTER — Ambulatory Visit (HOSPITAL_COMMUNITY)
Admission: RE | Admit: 2011-04-11 | Discharge: 2011-04-12 | Disposition: A | Discharge status: Home / Self Care | Payer: Medicare Other | Source: Ambulatory Visit | Attending: Interventional Radiology | Admitting: Interventional Radiology

## 2011-04-11 ENCOUNTER — Ambulatory Visit (HOSPITAL_COMMUNITY)
Admission: RE | Admit: 2011-04-11 | Discharge: 2011-04-11 | Disposition: A | Discharge status: Home / Self Care | Payer: Medicare Other | Source: Ambulatory Visit | Attending: Interventional Radiology | Admitting: Interventional Radiology

## 2011-04-11 ENCOUNTER — Other Ambulatory Visit: Payer: Self-pay | Admitting: Interventional Radiology

## 2011-04-11 DIAGNOSIS — N189 Chronic kidney disease, unspecified: Secondary | ICD-10-CM | POA: Insufficient documentation

## 2011-04-11 DIAGNOSIS — C649 Malignant neoplasm of unspecified kidney, except renal pelvis: Secondary | ICD-10-CM | POA: Insufficient documentation

## 2011-04-11 DIAGNOSIS — Z86711 Personal history of pulmonary embolism: Secondary | ICD-10-CM | POA: Insufficient documentation

## 2011-04-11 DIAGNOSIS — Z01818 Encounter for other preprocedural examination: Secondary | ICD-10-CM | POA: Insufficient documentation

## 2011-04-11 DIAGNOSIS — I2789 Other specified pulmonary heart diseases: Secondary | ICD-10-CM | POA: Insufficient documentation

## 2011-04-11 DIAGNOSIS — Z01810 Encounter for preprocedural cardiovascular examination: Secondary | ICD-10-CM | POA: Insufficient documentation

## 2011-04-11 DIAGNOSIS — N2889 Other specified disorders of kidney and ureter: Secondary | ICD-10-CM

## 2011-04-11 DIAGNOSIS — M349 Systemic sclerosis, unspecified: Secondary | ICD-10-CM | POA: Insufficient documentation

## 2011-04-11 DIAGNOSIS — Z01812 Encounter for preprocedural laboratory examination: Secondary | ICD-10-CM | POA: Insufficient documentation

## 2011-04-11 DIAGNOSIS — J8410 Pulmonary fibrosis, unspecified: Secondary | ICD-10-CM | POA: Insufficient documentation

## 2011-04-11 DIAGNOSIS — I509 Heart failure, unspecified: Secondary | ICD-10-CM | POA: Insufficient documentation

## 2011-04-11 DIAGNOSIS — K219 Gastro-esophageal reflux disease without esophagitis: Secondary | ICD-10-CM | POA: Insufficient documentation

## 2011-04-11 DIAGNOSIS — C50919 Malignant neoplasm of unspecified site of unspecified female breast: Secondary | ICD-10-CM | POA: Insufficient documentation

## 2011-04-12 LAB — BASIC METABOLIC PANEL
BUN: 20 mg/dL (ref 6–23)
CO2: 24 mEq/L (ref 19–32)
Calcium: 8.7 mg/dL (ref 8.4–10.5)
Chloride: 102 mEq/L (ref 96–112)
Creatinine, Ser: 1.1 mg/dL (ref 0.50–1.10)

## 2011-04-12 LAB — CBC
HCT: 24.6 % — ABNORMAL LOW (ref 36.0–46.0)
MCH: 32.3 pg (ref 26.0–34.0)
MCV: 99.2 fL (ref 78.0–100.0)
RBC: 2.48 MIL/uL — ABNORMAL LOW (ref 3.87–5.11)
RDW: 13.2 % (ref 11.5–15.5)
WBC: 8.1 10*3/uL (ref 4.0–10.5)

## 2011-04-15 ENCOUNTER — Other Ambulatory Visit: Payer: Self-pay | Admitting: Interventional Radiology

## 2011-04-15 ENCOUNTER — Other Ambulatory Visit (HOSPITAL_COMMUNITY): Payer: Self-pay | Admitting: Interventional Radiology

## 2011-04-15 DIAGNOSIS — N2889 Other specified disorders of kidney and ureter: Secondary | ICD-10-CM

## 2011-04-17 ENCOUNTER — Other Ambulatory Visit (INDEPENDENT_AMBULATORY_CARE_PROVIDER_SITE_OTHER): Payer: Medicare Other

## 2011-04-17 DIAGNOSIS — K921 Melena: Secondary | ICD-10-CM

## 2011-04-17 DIAGNOSIS — T887XXA Unspecified adverse effect of drug or medicament, initial encounter: Secondary | ICD-10-CM

## 2011-04-17 LAB — CBC WITH DIFFERENTIAL/PLATELET
Basophils Absolute: 0 10*3/uL (ref 0.0–0.1)
Eosinophils Absolute: 0.1 10*3/uL (ref 0.0–0.7)
HCT: 28.7 % — ABNORMAL LOW (ref 36.0–46.0)
Lymphs Abs: 1.3 10*3/uL (ref 0.7–4.0)
MCHC: 34.4 g/dL (ref 30.0–36.0)
MCV: 99.8 fl (ref 78.0–100.0)
Monocytes Absolute: 0.5 10*3/uL (ref 0.1–1.0)
Platelets: 191 10*3/uL (ref 150.0–400.0)
RDW: 13.9 % (ref 11.5–14.6)

## 2011-04-17 LAB — HEPATIC FUNCTION PANEL
ALT: 30 U/L (ref 0–35)
Total Bilirubin: 0.7 mg/dL (ref 0.3–1.2)

## 2011-04-18 ENCOUNTER — Telehealth: Payer: Self-pay | Admitting: *Deleted

## 2011-04-18 NOTE — Telephone Encounter (Signed)
Called pt. Patient s/p cryoablation by IR of a renal carincoma, clear cell type. She had been feeling bad. Hgb 6/23 was 8.0 g, chemistries normal. 6/28 Hgb 9.9. Liver functions norma. She continues to feel weak and light headed. She is hydrating with gatoraid. She will call if she doesn't feel better soon.

## 2011-04-18 NOTE — Telephone Encounter (Signed)
Patient called left msg on vm requesting results back from lab work. Pt states she is concerning about her Blood count level. Pls advise?

## 2011-05-02 ENCOUNTER — Telehealth: Payer: Self-pay | Admitting: *Deleted

## 2011-05-02 NOTE — Telephone Encounter (Signed)
Patient notified of lab appointment.

## 2011-05-02 NOTE — Telephone Encounter (Signed)
Message copied by Hulan Saas on Fri May 02, 2011  3:26 PM ------      Message from: Hulan Saas      Created: Wed Mar 05, 2011  1:49 PM       Patient due for repeat CBC, diff(brodie) on 05/05/11. Call and remind pt.

## 2011-05-05 ENCOUNTER — Other Ambulatory Visit (INDEPENDENT_AMBULATORY_CARE_PROVIDER_SITE_OTHER): Payer: Medicare Other

## 2011-05-05 DIAGNOSIS — D649 Anemia, unspecified: Secondary | ICD-10-CM

## 2011-05-05 LAB — CBC WITH DIFFERENTIAL/PLATELET
Basophils Relative: 0.3 % (ref 0.0–3.0)
Eosinophils Relative: 1.1 % (ref 0.0–5.0)
HCT: 29.7 % — ABNORMAL LOW (ref 36.0–46.0)
Hemoglobin: 10 g/dL — ABNORMAL LOW (ref 12.0–15.0)
MCV: 100.2 fl — ABNORMAL HIGH (ref 78.0–100.0)
Monocytes Absolute: 0.3 10*3/uL (ref 0.1–1.0)
Neutrophils Relative %: 68.2 % (ref 43.0–77.0)
RBC: 2.97 Mil/uL — ABNORMAL LOW (ref 3.87–5.11)
WBC: 4.9 10*3/uL (ref 4.5–10.5)

## 2011-05-06 ENCOUNTER — Telehealth: Payer: Self-pay | Admitting: *Deleted

## 2011-05-06 DIAGNOSIS — D649 Anemia, unspecified: Secondary | ICD-10-CM

## 2011-05-06 NOTE — Telephone Encounter (Signed)
Message copied by Hulan Saas on Tue May 06, 2011  8:52 AM ------      Message from: Lafayette Dragon      Created: Mon May 05, 2011  9:51 PM       Please call pt CBC improved slightly to her normal H/H. Repeat 2 months.

## 2011-05-06 NOTE — Telephone Encounter (Signed)
Left a message for patient to call me. Labs in [facility] for 07/07/11. Note to remind patient.

## 2011-05-07 NOTE — Telephone Encounter (Signed)
Patient given results as per Dr. Olevia Perches

## 2011-05-15 ENCOUNTER — Other Ambulatory Visit: Payer: Self-pay | Admitting: Interventional Radiology

## 2011-05-15 DIAGNOSIS — N2889 Other specified disorders of kidney and ureter: Secondary | ICD-10-CM

## 2011-05-20 ENCOUNTER — Other Ambulatory Visit (HOSPITAL_COMMUNITY): Payer: Self-pay | Admitting: Interventional Radiology

## 2011-05-20 ENCOUNTER — Ambulatory Visit (HOSPITAL_COMMUNITY)
Admission: RE | Admit: 2011-05-20 | Discharge: 2011-05-20 | Disposition: A | Discharge status: Home / Self Care | Payer: Medicare Other | Source: Ambulatory Visit | Attending: Interventional Radiology | Admitting: Interventional Radiology

## 2011-05-20 ENCOUNTER — Other Ambulatory Visit (HOSPITAL_COMMUNITY): Payer: Medicare Other

## 2011-05-20 ENCOUNTER — Ambulatory Visit
Admission: RE | Admit: 2011-05-20 | Discharge: 2011-05-20 | Disposition: A | Discharge status: Home / Self Care | Payer: Medicare Other | Source: Ambulatory Visit | Attending: Interventional Radiology | Admitting: Interventional Radiology

## 2011-05-20 VITALS — BP 120/50 | HR 88 | Temp 98.0°F | Resp 16 | Ht 64.0 in | Wt 142.0 lb

## 2011-05-20 DIAGNOSIS — N2889 Other specified disorders of kidney and ureter: Secondary | ICD-10-CM

## 2011-05-20 DIAGNOSIS — C649 Malignant neoplasm of unspecified kidney, except renal pelvis: Secondary | ICD-10-CM | POA: Insufficient documentation

## 2011-05-20 NOTE — Progress Notes (Signed)
Denies hematuria.  Denies left flank pain.  Denies any changes re:  Urination.  Walks 30-35 mins/day (approx 1.25 miles on treadmill).   No complaints.

## 2011-06-02 ENCOUNTER — Encounter: Payer: Self-pay | Admitting: Internal Medicine

## 2011-06-02 ENCOUNTER — Other Ambulatory Visit (INDEPENDENT_AMBULATORY_CARE_PROVIDER_SITE_OTHER): Payer: Medicare Other

## 2011-06-02 ENCOUNTER — Ambulatory Visit (INDEPENDENT_AMBULATORY_CARE_PROVIDER_SITE_OTHER): Payer: Medicare Other | Admitting: Internal Medicine

## 2011-06-02 DIAGNOSIS — D509 Iron deficiency anemia, unspecified: Secondary | ICD-10-CM

## 2011-06-02 DIAGNOSIS — K5521 Angiodysplasia of colon with hemorrhage: Secondary | ICD-10-CM

## 2011-06-02 DIAGNOSIS — R6889 Other general symptoms and signs: Secondary | ICD-10-CM

## 2011-06-02 DIAGNOSIS — R195 Other fecal abnormalities: Secondary | ICD-10-CM

## 2011-06-02 LAB — IBC PANEL: Transferrin: 213.5 mg/dL (ref 212.0–360.0)

## 2011-06-02 LAB — VITAMIN B12: Vitamin B-12: 364 pg/mL (ref 211–911)

## 2011-06-02 MED ORDER — OMEPRAZOLE 40 MG PO CPDR: 40.0000 mg | DELAYED_RELEASE_CAPSULE | Freq: Two times a day (BID) | ORAL | Status: DC

## 2011-06-02 NOTE — Progress Notes (Signed)
Paula Ward 1936-02-27 MRN [id number]     History of Present Illness:  This is a 75 year old white female with CREST syndrome, AV malformations and chronic low-grade GI blood loss. Her last upper endoscopy in June 2011 showed  4 cm hiatal hernia and irregular Z line. Biopsies were negative for Barrett's esophagus. She has AVMs in the stomach as well as in the duodenum. A prior colonoscopy showed AVMs in the left and right colon. Prior colonoscopies were completed in 2001 and 2005. She is intolerant to oral iron but has had numerous iron infusions. She had a recent cryoablation of a renal cell carcinoma by Dr Kathlene Cote on April 15, 2011. She is here to get refills on  Prilosec. Her last hemoglobin on 05/05/11 was 10.0 and her hematocrit was 31.7. This is her usual blood count.   Past Medical History  Diagnosis Date  . Cancer 1989    Left Breast  . Anemia   . Pulmonary embolus 2003  . CREST syndrome   . GERD (gastroesophageal reflux disease)   . Hypertension     Severe Pulmonary HTN  . Chronic kidney disease     Chronic mild renal insuffiency  . Angiodysplasia of stomach and duodenum   . Hiatal hernia   . Arthus phenomenon   . Breast cancer   . Pulmonary hypertension   . Interstitial lung disease   . Scleroderma   . Rectal incontinence   . AVM (arteriovenous malformation)   . Nodule of kidney   . Uterine polyp    Past Surgical History  Procedure Date  . Tonsillectomy   . Appendectomy 1962  . Breast lumpectomy 1989    left  . Ivc filter   . Mastectomy     left  . Kidney surgery     left     reports that she has never smoked. She has never used smokeless tobacco. She reports that she does not drink alcohol or use illicit drugs. family history includes Alzheimer's disease in her mother; Cancer in her father; Diabetes in her father and sister; Lung cancer in her sister; and Prostate cancer in her father.  There is no history of Colon cancer. Allergies  Allergen Reactions    . Codeine Nausea Only        Review of Systems: Denies heartburn, dysphagia, odynophagia, chest pain. Positive for mucousy  drainage from the rectum  The remainder of the 10  point ROS is negative except as outlined in H&P   Physical Exam: General appearance  Well developed in no distress. Eyes- non icteric. HEENT nontraumatic, normocephalic. Mouth no lesions, tongue papillated, no cheilosis. Neck supple without adenopathy, thyroid not enlarged, no carotid bruits, no JVD. Lungs Clear to auscultation bilaterally. Cor normal S1 normal S2, regular rhythm , no murmur,  quiet precordium. Abdomen soft, nontender, normoactive bowel sounds. Numerous AV malformations through the abdomen. Rectal: Soft trace Hemoccult-positive stool. 2 external hemorrhoidal tags. Extremities no pedal edema. Skin multiple telangiectasias on the upper extremities lower extremities trunk and face. Neurological alert and oriented x 3. Psychological normal mood and affect.  Assessment and Plan:  Problem #1 CREST syndrome. Patient has chronic GI blood loss. Currently, her H&H is stable. We will check her iron studies today as well as her vitamin B12 and tissue transglutaminase. She will continue on her Prilosec 40 mg daily. She has her H&H checked every month.   06/02/2011 Delfin Edis

## 2011-06-02 NOTE — Patient Instructions (Signed)
Your physician has requested that you go to the basement for the following lab work before leaving today: TtG, IBC Panel, B12 level. CC: Dr Linda Hedges

## 2011-06-04 ENCOUNTER — Encounter (HOSPITAL_COMMUNITY)
Admission: RE | Admit: 2011-06-04 | Discharge: 2011-06-04 | Disposition: A | Discharge status: Home / Self Care | Payer: Medicare Other | Source: Ambulatory Visit | Attending: Obstetrics | Admitting: Obstetrics

## 2011-06-04 ENCOUNTER — Encounter (HOSPITAL_COMMUNITY): Payer: Self-pay

## 2011-06-04 HISTORY — DX: Nausea with vomiting, unspecified: R11.2

## 2011-06-04 HISTORY — DX: Other specified postprocedural states: Z98.890

## 2011-06-04 LAB — BASIC METABOLIC PANEL
Chloride: 105 mEq/L (ref 96–112)
Creatinine, Ser: 1.25 mg/dL — ABNORMAL HIGH (ref 0.50–1.10)
GFR calc Af Amer: 51 mL/min — ABNORMAL LOW (ref >60)
GFR calc non Af Amer: 42 mL/min — ABNORMAL LOW (ref >60)
Potassium: 4.1 mEq/L (ref 3.5–5.1)

## 2011-06-04 LAB — CBC
HCT: 32.3 % — ABNORMAL LOW (ref 36.0–46.0)
Hemoglobin: 10.3 g/dL — ABNORMAL LOW (ref 12.0–15.0)
RDW: 13.5 % (ref 11.5–15.5)
WBC: 5.5 10*3/uL (ref 4.0–10.5)

## 2011-06-04 NOTE — Patient Instructions (Addendum)
[place]  06/04/2011   Your procedure is scheduled on:  06/11/11  Enter through the Main Entrance of [facility] at [place] up the phone at the desk and dial [phone].   Call this number if you have problems the morning of surgery: [phone]   Remember:   Do not eat food:After Midnight.  Do not drink clear liquids: After Midnight.  Take these medicines the morning of surgery with A SIP OF WATER: per anes   Do not wear jewelry, make-up or nail polish.  Do not wear lotions, powders, or perfumes. You may wear deodorant.  Do not shave 48 hours prior to surgery.  Do not bring valuables to the hospital.  Contacts, dentures or bridgework may not be worn into surgery.  Leave suitcase in the car. After surgery it may be brought to your room.  For patients admitted to the hospital, checkout time is 11:00 AM the day of discharge.   Patients discharged the day of surgery will not be allowed to drive home.  Name and phone number of your driver: husband   Josph Macho  Special Instructions: CHG wash per written instruction sheet   Please read over the following fact sheets that you were given: NA

## 2011-06-04 NOTE — Anesthesia Preprocedure Evaluation (Addendum)
Anesthesia Evaluation  Name, MR# and DOB Patient awake  General Assessment Comment  Reviewed: Allergy & Precautions, H&P , Patient's Chart, lab work & pertinent test results  History of Anesthesia Complications (+) PONV  Airway  TM Distance: >3 FB Neck ROM: Full    Dental No notable dental hx. (+) Teeth Intact and Caps   Pulmonary  + rales (bilateral bases)  pulmonary exam normalPulmonary Exam Normal rales (bilateral bases) none    Cardiovascular hypertension, Regular Normal Pulmonary Embolism in past. Pt. Has IVC filter.  Also has severe pulmonary hypertension.Pulmonary Embolism in past. Pt. Has IVC filter.  Also has severe pulmonary hypertension.:     Neuro/Psych Negative Neurological ROS  Negative Psych ROS  GI/Hepatic/Renal   negative Liver ROS  negative Renal ROS  hiatal hernia,  GERD Controlled and Medicated     Endo/Other  Negative Endocrine ROS (+)      Abdominal Normal abdominal exam  (+)   Musculoskeletal negative musculoskeletal ROS (+)   Hematology negative hematology ROS (+) anemia, , Scleroderma. CREST syndrome   Peds  Reproductive/Obstetrics negative OB ROS    Anesthesia Other Findings Febrile today 101.3 - possibly from cytotec per Dr. Valentino Saxon          Anesthesia Physical Anesthesia Plan  ASA: III  Anesthesia Plan: MAC   Post-op Pain Management:    Induction:   Airway Management Planned:   Additional Equipment:   Intra-op Plan:   Post-operative Plan: Extubation in OR  Informed Consent: I have reviewed the patients History and Physical, chart, labs and discussed the procedure including the risks, benefits and alternatives for the proposed anesthesia with the patient or authorized representative who has indicated his/her understanding and acceptance.   Dental advisory given  Plan Discussed with: Surgeon and CRNA  Anesthesia Plan Comments: (Febrile today with crackles in  both lung bases.  Per Dr. Valentino Saxon, fever could be from cytotec.  Per pt, crackles are baseline (but were not noted by Dr. Luis Abed exam).  Will do CXR and CBC stat to evaluate.  Charlton Haws, MD  CXR is unchanged from prior, and WBC is within normal limits.  Will proceed with surgery. Charlton Haws, MD)      Anesthesia Quick Evaluation

## 2011-06-05 ENCOUNTER — Encounter: Payer: Self-pay | Admitting: Internal Medicine

## 2011-06-05 ENCOUNTER — Telehealth: Payer: Self-pay | Admitting: *Deleted

## 2011-06-05 NOTE — Telephone Encounter (Signed)
Message copied by Hulan Saas on Thu Jun 05, 2011  1:54 PM ------      Message from: [place] Dragon      Created: Thu Jun 05, 2011  1:29 PM       Please call pt with negative blood test for celiac disease

## 2011-06-05 NOTE — Telephone Encounter (Signed)
Patient notified of results as per Dr. Olevia Perches.

## 2011-06-11 ENCOUNTER — Other Ambulatory Visit: Payer: Self-pay | Admitting: Obstetrics

## 2011-06-11 ENCOUNTER — Encounter (HOSPITAL_COMMUNITY)
Admission: RE | Disposition: A | Discharge status: Home / Self Care | Payer: Self-pay | Source: Ambulatory Visit | Attending: Obstetrics

## 2011-06-11 ENCOUNTER — Ambulatory Visit (HOSPITAL_COMMUNITY): Payer: Medicare Other

## 2011-06-11 ENCOUNTER — Ambulatory Visit (HOSPITAL_COMMUNITY)
Admission: RE | Admit: 2011-06-11 | Discharge: 2011-06-11 | Disposition: A | Discharge status: Home / Self Care | Payer: Medicare Other | Source: Ambulatory Visit | Attending: Obstetrics | Admitting: Obstetrics

## 2011-06-11 ENCOUNTER — Encounter (HOSPITAL_COMMUNITY): Payer: Self-pay | Admitting: Anesthesiology

## 2011-06-11 ENCOUNTER — Ambulatory Visit (HOSPITAL_COMMUNITY): Payer: Medicare Other | Admitting: Anesthesiology

## 2011-06-11 DIAGNOSIS — J984 Other disorders of lung: Secondary | ICD-10-CM | POA: Insufficient documentation

## 2011-06-11 DIAGNOSIS — Z01812 Encounter for preprocedural laboratory examination: Secondary | ICD-10-CM | POA: Insufficient documentation

## 2011-06-11 DIAGNOSIS — Z01818 Encounter for other preprocedural examination: Secondary | ICD-10-CM | POA: Insufficient documentation

## 2011-06-11 DIAGNOSIS — I517 Cardiomegaly: Secondary | ICD-10-CM | POA: Insufficient documentation

## 2011-06-11 DIAGNOSIS — N840 Polyp of corpus uteri: Secondary | ICD-10-CM

## 2011-06-11 LAB — CBC
Hemoglobin: 10.7 g/dL — ABNORMAL LOW (ref 12.0–15.0)
MCH: 33.1 pg (ref 26.0–34.0)
MCHC: 32.2 g/dL (ref 30.0–36.0)
Platelets: 203 10*3/uL (ref 150–400)
RDW: 13.4 % (ref 11.5–15.5)

## 2011-06-11 LAB — TYPE AND SCREEN
ABO/RH(D): A POS
Antibody Screen: NEGATIVE

## 2011-06-11 LAB — ABO/RH: ABO/RH(D): A POS

## 2011-06-11 SURGERY — DILATATION & CURETTAGE/HYSTEROSCOPY WITH RESECTOCOPE
Anesthesia: Choice | Wound class: Clean Contaminated

## 2011-06-11 MED ORDER — LIDOCAINE-EPINEPHRINE 1 %-1:100000 IJ SOLN
INTRAMUSCULAR | Status: DC | PRN
  Administered 2011-06-11: 20 mL
  Administered 2011-06-11: 10 mL

## 2011-06-11 MED ORDER — ONDANSETRON HCL 4 MG/2ML IJ SOLN
INTRAMUSCULAR | Status: DC | PRN
  Administered 2011-06-11: 4 mg via INTRAVENOUS

## 2011-06-11 MED ORDER — MIDAZOLAM HCL 5 MG/5ML IJ SOLN
INTRAMUSCULAR | Status: DC | PRN
  Administered 2011-06-11 (×4): 0.5 mg via INTRAVENOUS

## 2011-06-11 MED ORDER — PROPOFOL 10 MG/ML IV EMUL
INTRAVENOUS | Status: AC
  Filled 2011-06-11: qty 50

## 2011-06-11 MED ORDER — DEXAMETHASONE SODIUM PHOSPHATE 10 MG/ML IJ SOLN
INTRAMUSCULAR | Status: AC
  Filled 2011-06-11: qty 1

## 2011-06-11 MED ORDER — MIDAZOLAM HCL 2 MG/2ML IJ SOLN
INTRAMUSCULAR | Status: AC
  Filled 2011-06-11: qty 2

## 2011-06-11 MED ORDER — METOCLOPRAMIDE HCL 5 MG/ML IJ SOLN: 10.0000 mg | Freq: Once | INTRAMUSCULAR | Status: DC | PRN

## 2011-06-11 MED ORDER — FENTANYL CITRATE 0.05 MG/ML IJ SOLN
INTRAMUSCULAR | Status: DC | PRN
  Administered 2011-06-11 (×2): 25 ug via INTRAVENOUS
  Administered 2011-06-11: 50 ug via INTRAVENOUS

## 2011-06-11 MED ORDER — FENTANYL CITRATE 0.05 MG/ML IJ SOLN: 25.0000 ug | INTRAMUSCULAR | Status: DC | PRN

## 2011-06-11 MED ORDER — FENTANYL CITRATE 0.05 MG/ML IJ SOLN
INTRAMUSCULAR | Status: AC
  Filled 2011-06-11: qty 2

## 2011-06-11 MED ORDER — LIDOCAINE HCL (CARDIAC) 20 MG/ML IV SOLN
INTRAVENOUS | Status: AC
  Filled 2011-06-11: qty 5

## 2011-06-11 MED ORDER — DEXAMETHASONE SODIUM PHOSPHATE 10 MG/ML IJ SOLN
INTRAMUSCULAR | Status: DC | PRN
  Administered 2011-06-11: 10 mg via INTRAVENOUS

## 2011-06-11 MED ORDER — LACTATED RINGERS IV SOLN
INTRAVENOUS | Status: DC
  Administered 2011-06-11 (×2): via INTRAVENOUS

## 2011-06-11 MED ORDER — MEPERIDINE HCL 25 MG/ML IJ SOLN: 6.2500 mg | INTRAMUSCULAR | Status: DC | PRN

## 2011-06-11 MED ORDER — ONDANSETRON HCL 4 MG/2ML IJ SOLN
INTRAMUSCULAR | Status: AC
  Filled 2011-06-11: qty 2

## 2011-06-11 MED ORDER — SODIUM CHLORIDE 0.9 % IR SOLN
Status: DC | PRN
  Administered 2011-06-11: 3000 mL

## 2011-06-11 MED ORDER — PROPOFOL 10 MG/ML IV EMUL
INTRAVENOUS | Status: DC | PRN
  Administered 2011-06-11: 120 mg via INTRAVENOUS
  Administered 2011-06-11: 20 mg via INTRAVENOUS

## 2011-06-11 SURGICAL SUPPLY — 19 items
CANISTER SUCTION 2500CC (MISCELLANEOUS) ×2 IMPLANT
CATH ROBINSON RED A/P 16FR (CATHETERS) ×2 IMPLANT
CLOTH BEACON ORANGE TIMEOUT ST (SAFETY) ×2 IMPLANT
CONTAINER PREFILL 10% NBF 60ML (FORM) ×4 IMPLANT
DRAPE UTILITY XL STRL (DRAPES) ×4 IMPLANT
ELECT REM PT RETURN 9FT ADLT (ELECTROSURGICAL)
ELECTRODE REM PT RTRN 9FT ADLT (ELECTROSURGICAL) IMPLANT
ELECTRODE ROLLER BARREL 22FR (ELECTROSURGICAL) IMPLANT
ELECTRODE VAPORCUT 22FR (ELECTROSURGICAL) IMPLANT
GLOVE BIO SURGEON STRL SZ 6.5 (GLOVE) ×2 IMPLANT
GLOVE BIOGEL PI IND STRL 7.0 (GLOVE) ×2 IMPLANT
GLOVE BIOGEL PI INDICATOR 7.0 (GLOVE) ×2
GOWN PREVENTION PLUS LG XLONG (DISPOSABLE) ×4 IMPLANT
LOOP ANGLED CUTTING 22FR (CUTTING LOOP) IMPLANT
PACK HYSTEROSCOPY LF (CUSTOM PROCEDURE TRAY) ×2 IMPLANT
STENT BALLN UTERINE 3CM 6FR (Stent) IMPLANT
STENT BALLN UTERINE 4CM 6FR (STENTS) IMPLANT
TOWEL OR 17X24 6PK STRL BLUE (TOWEL DISPOSABLE) ×4 IMPLANT
WATER STERILE IRR 1000ML POUR (IV SOLUTION) ×2 IMPLANT

## 2011-06-11 NOTE — Anesthesia Postprocedure Evaluation (Signed)
Anesthesia Post Note  Patient: Paula Ward  Procedure(s) Performed:  DILATATION & CURETTAGE/HYSTEROSCOPY WITH RESECTOSCOPE  Anesthesia type: MAC  Patient location: PACU  Post pain: Pain level controlled  Post assessment: Post-op Vital signs reviewed  Last Vitals:  Filed Vitals:   06/11/11 1200  BP: 123/46  Pulse: 89  Temp: 99.1 F (37.3 C)  Resp: 20    Post vital signs: Reviewed  Level of consciousness: sedated  Complications: No apparent anesthesia complications

## 2011-06-11 NOTE — Op Note (Signed)
Operative note for Mickle Plumb Preoperative diagnosis endometrial polyp Postoperative diagnosis endometrial polyp Procedure hysteroscopy D&C polypectomy Surgeon Valentino Saxon Anesthesia local with IV sedation Complications none EBL 10 cc Antibiotics none Indications this is a 75 year old postmenopausal patient who was found to have endometrial thickening on ultrasound to evaluate bloating patient's history is significant for breast and renal cancer. Given the endometrial thickening and a negative endometrial biopsy sonohysterogram was performed which showed a 2 x 1 cm endometrial polyp with cystic appearance. The decision was made for hysteroscopy and polypectomy for pathologic diagnosis.  Procedure: After informed consent was obtained from the patient the patient was taken to the operating room where IV sedation was administered without difficulty she was prepped and draped in normal sterile fashion in the dorsal supine lithotomy position. No catheterization was done. A bimanual examination was performed to assess the size and position uterus. A sterile speculum was inserted into the vagina. A single-tooth tenaculum was used to grasp the anterior lip of the cervix. 10 cc of 1% lidocaine with 1:100,000 units epinephrine was injected each at 5 and 7:00 the cervical paracervical junction. A small Pratt dilator was easily advanced into the uterine cavity this was removed and a small Pataki hysteroscope was inserted into the uterine cavity both ostia were clearly seen the endometrium was thin and atrophic but enlarged 2.5 x 1.5 cm polyp with cystic appearance was noted. Using the hysteroscopic cutting scissors through the hysteroscope I attempted to attach the stomach edges of the polyp to the endometrial cavity. Given the slow rate of progress with the cutting shears and the large size of the polyp the decision was made to remove the hysteroscope and inserted polyp forceps into the uterine cavity with several  attempts with the polyp forceps the large polyp was removed in several pieces the hysteroscope was used after every couple passes to ensure no uterine perforation in this postmenopausal patient and to assess, to the polyp was remaining a sharp curettage was then carried out repeat hysteroscope showed complete evacuation of the entire polyp bilateral ostia were visualized and no uterine perforation was noted this point the procedure was terminated the patient tolerated the procedure well sponge lap and needle counts were correct x3 the patient was taken to the recovery room in stable condition.  Syd Manges A. 06/11/2011 11:50 AM

## 2011-06-11 NOTE — Interval H&P Note (Signed)
History and Physical Interval Note:   06/11/2011   10:38 AM   Paula Ward  has presented today for surgery, with the diagnosis of Endometrial Polyp  The various methods of treatment have been discussed with the patient and family. After consideration of risks, benefits and other options for treatment, the patient has consented to  Procedure(s): Parcoal as a surgical intervention .  I have reviewed the patients' chart and labs.  Questions were answered to the patient's satisfaction.     Paula Ward A.  MD       Fever noted today on vitals, pt w/o any symptoms of infection. Stable CXR, normal WBC Proceed as planned Catherine Cubero A. 06/11/2011 10:39 AM

## 2011-06-11 NOTE — H&P (Signed)
  See scanned document, H&P also dictated

## 2011-06-11 NOTE — Brief Op Note (Signed)
06/11/2011  11:41 AM  PATIENT:  Paula Ward  75 y.o. female  PRE-OPERATIVE DIAGNOSIS:  Endometrial Polyp  POST-OPERATIVE DIAGNOSIS:  Endometrial Polyp  PROCEDURE:  Procedure(s): DILATATION & CURETTAGE, hysteroscopy, polypectomy  SURGEON:  Surgeon(s): Kolyn Rozario A. Mikle Sternberg  PHYSICIAN ASSISTANT:   ASSISTANTS: none   ANESTHESIA:   local and IV sedation  ESTIMATED BLOOD LOSS: * No blood loss amount entered *   BLOOD ADMINISTERED:none  DRAINS: none   LOCAL MEDICATIONS USED:  LIDOCAINE 1% w/ 1:100,000 units epihenprhine, 20 CC  SPECIMEN:  Source of Specimen:  endometrial polyp and currettings  DISPOSITION OF SPECIMEN:  PATHOLOGY  COUNTS:  YES  TOURNIQUET:  * No tourniquets in log *  DICTATION #: see typed note  PLAN OF CARE: routine, d/c home  PATIENT DISPOSITION:  PACU - hemodynamically stable.   Delay start of Pharmacological VTE agent (>24hrs) due to surgical blood loss or risk of bleeding:  Yes  Paula Ward A. 06/11/2011 11:43 AM

## 2011-06-11 NOTE — H&P (Signed)
Paula Ward, Paula Ward               ACCOUNT NO.:  [id number]  MEDICAL RECORD NO.:  [id number]  LOCATION:  [facility]                          FACILITY:  [facility]  PHYSICIAN:  Ala Dach, MD   DATE OF BIRTH:  [birth year]  DATE OF ADMISSION:  06/11/2011 DATE OF DISCHARGE:                             HISTORY & PHYSICAL   CHIEF COMPLAINTS:  Endometrial polyp for polypectomy, hysteroscopy.  HISTORY OF PRESENT ILLNESS:  This is a 75 year old with significant medical history for prior deep vein thrombosis, current IVC filter, pulmonary hypertension, history of breast cancer, CREST syndrome who was seen earlier this year with complaints of bloating without vaginal bleeding.  An ultrasound was done to assess her ovaries due to her history of breast cancer but instead found thickened endometrial lining. Followup endometrial biopsy obtained only limited columnar epithelium and sonohysterogram demonstrated a 2 x 1 cm polyp with cystic appearance.  Given her history of breast cancer and recent concern for kidney cancer with recent minimally invasive biopsy or possibly ablative procedure, decision was made to proceed for polypectomy for pathologic diagnosis of these polyps.  PAST MEDICAL HISTORY:  Pulmonary hypertension, CREST syndrome, history of DVT, breast cancer.  ALLERGIES:  No known drug allergies.  SOCIAL HISTORY:  Retired, married, moderate caffeine use.  MEDICATIONS:  Omeprazole, torsemide, spironolactone, diltiazem, Tracleer, vitamin D, fluticasone.  REVIEW OF SYSTEMS:  The patient notes no fevers, no cough, no chest pain, no shortness of breath.  No vaginal bleeding, mild abdominal cramping.  PHYSICAL EXAMINATION:  VITAL SIGNS:  Per EMR. GENERAL:  Well appearing, no distress. CARDIOVASCULAR:  Regular rate and rhythm.  No murmurs appreciated. PULMONARY:  Crackles at the bases bilaterally. BACK:  No CVA tenderness. ABDOMEN:  Soft, nontender. GU:  Deferred to the OR. LOWER  EXTREMITIES:  Nontender with no edema.  Elevated temperature noted.  Repeat CBC showed stable hemoglobin.  No elevated white count.  ASSESSMENT/PLAN:  This is a 75 year old patient with significant cancer history and significant medical history with cystic endometrial polyp on sonohysterogram here for D and C, hysteroscopy, polypectomy for pathologic diagnosis. 1. Risks, benefits of procedure was discussed with the patient.  She     agrees to proceed. 2. Fever.  This is likely secondary to her preoperative Cytotec     placement.  No evidence of acute pneumonia, acute pulmonary     findings, and no white blood cell count.     Ala Dach, MD     KAF/MEDQ  D:  06/11/2011  T:  06/11/2011  Job:  [id number]

## 2011-06-11 NOTE — Transfer of Care (Signed)
Immediate Anesthesia Transfer of Care Note  Patient: Paula Ward  Procedure(s) Performed:  DILATATION & CURETTAGE/HYSTEROSCOPY WITH RESECTOSCOPE  Patient Location: PACU  Anesthesia Type: MAC  Level of Consciousness: awake, alert  and oriented  Airway & Oxygen Therapy: Patient Spontanous Breathing and Patient connected to nasal cannula oxygen  Post-op Assessment: Report given to PACU RN and Post -op Vital signs reviewed and stable  Post vital signs: Reviewed and stable  Complications: No apparent anesthesia complications

## 2011-06-16 ENCOUNTER — Other Ambulatory Visit (INDEPENDENT_AMBULATORY_CARE_PROVIDER_SITE_OTHER): Payer: Medicare Other

## 2011-06-16 DIAGNOSIS — K921 Melena: Secondary | ICD-10-CM

## 2011-06-16 DIAGNOSIS — T887XXA Unspecified adverse effect of drug or medicament, initial encounter: Secondary | ICD-10-CM

## 2011-06-16 LAB — CBC WITH DIFFERENTIAL/PLATELET
Basophils Relative: 0.4 % (ref 0.0–3.0)
Hemoglobin: 10.2 g/dL — ABNORMAL LOW (ref 12.0–15.0)
Lymphocytes Relative: 22.8 % (ref 12.0–46.0)
Monocytes Relative: 6.1 % (ref 3.0–12.0)
Neutro Abs: 4.4 10*3/uL (ref 1.4–7.7)
RBC: 3.02 Mil/uL — ABNORMAL LOW (ref 3.87–5.11)

## 2011-06-16 LAB — HEPATIC FUNCTION PANEL
AST: 29 U/L (ref 0–37)
Albumin: 4 g/dL (ref 3.5–5.2)
Alkaline Phosphatase: 49 U/L (ref 39–117)
Total Protein: 6.8 g/dL (ref 6.0–8.3)

## 2011-06-18 ENCOUNTER — Telehealth: Payer: Self-pay | Admitting: *Deleted

## 2011-06-18 NOTE — Telephone Encounter (Signed)
Patient requesting results of "blood count".

## 2011-06-18 NOTE — Telephone Encounter (Signed)
Hgb stable at 10.2 g. LFTs normal. Send copy of labs to Dr. Gaynell Face at [facility].. Thanks

## 2011-06-20 NOTE — Telephone Encounter (Signed)
Pt informed labs faxed out to Dr Gaynell Face at [facility]

## 2011-06-26 ENCOUNTER — Encounter: Payer: Self-pay | Admitting: Internal Medicine

## 2011-06-26 ENCOUNTER — Ambulatory Visit (INDEPENDENT_AMBULATORY_CARE_PROVIDER_SITE_OTHER)
Admission: RE | Admit: 2011-06-26 | Discharge: 2011-06-26 | Disposition: A | Discharge status: Home / Self Care | Payer: Medicare Other | Source: Ambulatory Visit | Attending: Internal Medicine | Admitting: Internal Medicine

## 2011-06-26 ENCOUNTER — Ambulatory Visit (INDEPENDENT_AMBULATORY_CARE_PROVIDER_SITE_OTHER): Payer: Medicare Other | Admitting: Internal Medicine

## 2011-06-26 VITALS — BP 138/60 | HR 89 | Temp 97.9°F | Wt 145.0 lb

## 2011-06-26 DIAGNOSIS — Z23 Encounter for immunization: Secondary | ICD-10-CM

## 2011-06-26 DIAGNOSIS — C649 Malignant neoplasm of unspecified kidney, except renal pelvis: Secondary | ICD-10-CM

## 2011-06-26 DIAGNOSIS — N840 Polyp of corpus uteri: Secondary | ICD-10-CM

## 2011-06-26 DIAGNOSIS — M542 Cervicalgia: Secondary | ICD-10-CM

## 2011-06-26 MED ORDER — TETANUS-DIPHTH-ACELL PERTUSSIS 5-2.5-18.5 LF-MCG/0.5 IM SUSP: 0.5000 mL | Freq: Once | INTRAMUSCULAR | Status: DC

## 2011-06-26 NOTE — Patient Instructions (Signed)
Neck  Pain - most likely strain. Will check x-ray to look for any arthritic changes. For discomfort - range of motion exercise, aleve 1 or 2 tablets twice a day for 1 week. Continue with heat and massage. Chiropractic with Dr. Bobby Rumpf is also fine.  Will bring immunizations up to date.   Cervical and Neck Sprain and Strain (Neck Sprain and Strain) A cervical sprain is an injury to the neck. The injury can include either over-stretching or even small tears in the ligaments that hold the bones of the neck in place. A strain affects muscles and tendons. Minor injuries usually only involve ligaments and muscles. Because the different parts of the neck are so close together, more severe injuries can involve both sprain and strain. These injuries can affect the muscles, ligaments, tendons, discs, and nerves in the neck. SYMPTOMS  Pain, soreness, stiffness, or burning sensation in the front, back, or sides of the neck. This may develop immediately after injury. Onset of discomfort may also develop slowly and not begin for 24 hours or more.   Shoulder and/or upper back pain.   Limits to the normal movement of the neck.   Headache.   Dizziness.   Weakness and/or abnormal sensation (such as numbness or tingling) of one or both arms and/or hands.   Muscle spasm.   Difficulty with swallowing or chewing.   Tenderness and swelling at the injury site.  CAUSES An injury may be the result of a direct blow or from certain habits that can lead to the symptoms noted above.  Injury from:   Contact sports (such as football, rugby, wrestling, hockey, auto racing, gymnastics, diving, martial arts, and boxing).   Motor vehicle accidents.   Whiplash injuries (see image at right). These are common. They occur when the neck is forcefully whipped or forced backward and/or forward.   Falls.   Lifestyle or awkward postures:   Cradling a telephone between the ear and shoulder.   Sitting in a chair that  offers no support.   Working at an Public affairs consultant station.   Activities that require hours of repeated or long periods of looking up (stretching the neck backward) or looking down (bending the head/neck forward).  DIAGNOSIS   Most of the time, your caregiver can diagnose this problem with a careful history and examination. The history will include information about known problems (such as arthritis in the neck) or a previous neck injury. X-rays may be ordered to find out if there is a different problem. X-rays can also help to find problems with the bones of the neck not related to the injury or current symptoms. TREATMENT Several treatment options are available to help pain, spasm, and other symptoms. They include:  Cold helps relieve pain and reduce inflammation. Cold should be applied for 10 to 15 minutes every 2 to 3 hours after any activity that aggravates your symptoms. Use ice packs or an ice massage. Place a towel or cloth in between your skin and the ice pack.   Medication:   Only take over-the-counter or prescription medicines for pain, discomfort, or fever as directed by your caregiver.   Pain relievers or muscle relaxants may be prescribed. Use only as directed and only as much as you need.   Change in the activity that caused the problem. This might include using a headset with a telephone so that the phone is not propped between your ear and shoulder.   Neck collar. Your caregiver may recommend temporary use  of a soft cervical collar.   Work station. Changes may be needed in your work place. A better sitting position and/or better posture during work may be part of your treatment.   Physical Therapy. Your caregiver may recommend physical therapy. This can include instructions in the use of stretching and strengthening exercises. Improvement in posture is important. Exercises and posture training can help stabilize the neck and strengthen muscles and keep symptoms from  returning.  HOME CARE INSTRUCTIONS   Other than formal physical therapy, all treatments above can be done at home. Even when not at work, it is important to be conscious of your posture and of activities that can cause a return of symptoms. Most cervical sprains and/or strains are better in 1-3 weeks. As you improve and increase activities, doing a warm up and stretching before the activity will help prevent recurrent problems. SEEK MEDICAL CARE IF:    Pain is not effectively controlled with medication.   You feel unable to decrease pain medication over time as planned.   Activity level is not improving as planned and/or expected.  SEEK IMMEDIATE MEDICAL CARE IF:    While using medication, you develop any bleeding, stomach upset, or signs of an allergic reaction.   Symptoms get worse, become intolerable, and are not helped by medications.   New, unexplained symptoms develop.   You experience numbness, tingling, weakness, or paralysis of any part of your body.  MAKE SURE YOU:    Understand these instructions.   Will watch your condition.   Will get help right away if you are not doing well or get worse.  Document Released: 08/03/2007 Document Re-Released: 01/02/2009 [facility] Patient Information 2011 [place].

## 2011-06-26 NOTE — Progress Notes (Signed)
  Subjective:    Patient ID: Paula Ward, female    DOB: 1935/10/26, 75 y.o.   MRN: [id number]  HPI Paula Ward presents with a 1 month h/o left neck pain, posteriorly on the left. She has been trying heat and massage and has take advil for one day which did give some relief. No hand or UE weakness, paresthesia or other symptoms. She does recall any precipitating event.  I have reviewed the patient's medical history in detail and updated the computerized patient record. Interval history is significant for IR -ablation of renal tumor, gyn surgery for endometrial polyp - benign.    Review of Systems System review is negative for any constitutional, cardiac, pulmonary, GI or neuro symptoms or complaints     Objective:   Physical Exam Vitals reviewed - normal. HEENT - C&S clear Neck - supple with full ROM actively. Tender to palpation along the posterior left neck to the trapezius. No mass or lesion. 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  Plan - C-spine series           Aleve bid for 1 week (check - no drug interaction with NSAID and tracleer)           OK for chiropractic

## 2011-06-30 ENCOUNTER — Encounter: Payer: Self-pay | Admitting: Internal Medicine

## 2011-07-02 ENCOUNTER — Telehealth: Payer: Self-pay | Admitting: *Deleted

## 2011-07-02 NOTE — Telephone Encounter (Signed)
Spoke w/pt - informed of xray results per her request.

## 2011-07-03 ENCOUNTER — Telehealth: Payer: Self-pay | Admitting: *Deleted

## 2011-07-03 NOTE — Telephone Encounter (Signed)
Message copied by Hulan Saas on Thu Jul 03, 2011 10:38 AM ------      Message from: Hulan Saas      Created: Tue May 06, 2011  8:55 AM       Call and remind CBC due on 07/07/11 (DB)

## 2011-07-03 NOTE — Telephone Encounter (Signed)
Spoke with patient and reminded her of appointment for lab work

## 2011-07-08 ENCOUNTER — Other Ambulatory Visit (INDEPENDENT_AMBULATORY_CARE_PROVIDER_SITE_OTHER): Payer: Medicare Other

## 2011-07-08 DIAGNOSIS — D649 Anemia, unspecified: Secondary | ICD-10-CM

## 2011-07-08 LAB — CBC WITH DIFFERENTIAL/PLATELET
Basophils Absolute: 0 10*3/uL (ref 0.0–0.1)
Lymphocytes Relative: 25.6 % (ref 12.0–46.0)
Lymphs Abs: 1.3 10*3/uL (ref 0.7–4.0)
Monocytes Relative: 6.1 % (ref 3.0–12.0)
Platelets: 193 10*3/uL (ref 150.0–400.0)
RDW: 14 % (ref 11.5–14.6)

## 2011-07-09 ENCOUNTER — Telehealth: Payer: Self-pay | Admitting: *Deleted

## 2011-07-09 NOTE — Telephone Encounter (Signed)
Message copied by Lance Morin on Wed Jul 09, 2011  2:46 PM ------      Message from: Lafayette Dragon      Created: Tue Jul 08, 2011 10:28 PM       Please call pt with stable CBC

## 2011-07-09 NOTE — Telephone Encounter (Signed)
Message copied by Hulan Saas on Wed Jul 09, 2011  9:10 AM ------      Message from: Lafayette Dragon      Created: Tue Jul 08, 2011 10:28 PM       Please call pt with stable CBC

## 2011-07-09 NOTE — Progress Notes (Signed)
Patient advised that CBC is stable. She verbalizes understanding.

## 2011-07-09 NOTE — Telephone Encounter (Signed)
Patient given results as per Dr. Brodie 

## 2011-07-09 NOTE — Telephone Encounter (Signed)
Left message for patient to call me

## 2011-07-21 LAB — CBC
HCT: 24.2 — ABNORMAL LOW
Hemoglobin: 6.6 — CL
Hemoglobin: 8.1 — ABNORMAL LOW
MCHC: 33.3
MCHC: 33.3
MCV: 97
RBC: 1.99 — ABNORMAL LOW
RBC: 2.49 — ABNORMAL LOW
RDW: 13.9
RDW: 17.6 — ABNORMAL HIGH
WBC: 6.1

## 2011-07-21 LAB — TYPE AND SCREEN: ABO/RH(D): A POS

## 2011-07-21 LAB — PREPARE FRESH FROZEN PLASMA

## 2011-07-21 LAB — CROSSMATCH

## 2011-07-21 LAB — HEMOGLOBIN AND HEMATOCRIT, BLOOD
HCT: 23 — ABNORMAL LOW
HCT: 25.2 — ABNORMAL LOW
HCT: 31.1 — ABNORMAL LOW
Hemoglobin: 8.6 — ABNORMAL LOW
Hemoglobin: 9.1 — ABNORMAL LOW
Hemoglobin: 10.6 — ABNORMAL LOW

## 2011-07-21 LAB — PROTIME-INR
INR: 7.1
INR: 1
Prothrombin Time: 20.4 — ABNORMAL HIGH
Prothrombin Time: 23.2 — ABNORMAL HIGH
Prothrombin Time: 13.2
Prothrombin Time: 15.3 — ABNORMAL HIGH
Prothrombin Time: 16.5 — ABNORMAL HIGH

## 2011-07-21 LAB — BASIC METABOLIC PANEL
CO2: 26
CO2: 22
CO2: 23
Calcium: 9.2
Chloride: 111
Creatinine, Ser: 1.15
GFR calc Af Amer: >60
GFR calc Af Amer: 57 — ABNORMAL LOW
GFR calc Af Amer: >60
GFR calc non Af Amer: 51 — ABNORMAL LOW
GFR calc non Af Amer: 47 — ABNORMAL LOW
Glucose, Bld: 92
Glucose, Bld: 101 — ABNORMAL HIGH
Potassium: 4.2
Potassium: 4.3
Sodium: 143
Sodium: 140

## 2011-07-21 LAB — DIFFERENTIAL
Basophils Absolute: 0
Lymphocytes Relative: 20
Monocytes Absolute: 0.3
Neutro Abs: 5.6
Neutrophils Relative %: 75

## 2011-07-21 LAB — OCCULT BLOOD X 1 CARD TO LAB, STOOL: Fecal Occult Bld: POSITIVE

## 2011-07-21 LAB — APTT
aPTT: 51 — ABNORMAL HIGH
aPTT: 30

## 2011-07-21 LAB — PREPARE RBC (CROSSMATCH)

## 2011-07-21 LAB — COMPREHENSIVE METABOLIC PANEL
ALT: 18
Albumin: 3.4 — ABNORMAL LOW
Calcium: 9
GFR calc Af Amer: 55 — ABNORMAL LOW
Glucose, Bld: 121 — ABNORMAL HIGH
Potassium: 3.9
Sodium: 138
Total Protein: 5.9 — ABNORMAL LOW

## 2011-07-21 LAB — H. PYLORI ANTIBODY, IGG: H Pylori IgG: 0.7

## 2011-07-22 LAB — CROSSMATCH

## 2011-09-01 ENCOUNTER — Telehealth: Payer: Self-pay | Admitting: *Deleted

## 2011-09-01 DIAGNOSIS — D649 Anemia, unspecified: Secondary | ICD-10-CM

## 2011-09-01 DIAGNOSIS — I10 Essential (primary) hypertension: Secondary | ICD-10-CM

## 2011-09-01 DIAGNOSIS — Z9089 Acquired absence of other organs: Secondary | ICD-10-CM

## 2011-09-01 NOTE — Telephone Encounter (Signed)
Pt states she is due for hepatic blood work and also her creatnine.

## 2011-09-03 ENCOUNTER — Telehealth: Payer: Self-pay | Admitting: *Deleted

## 2011-09-05 ENCOUNTER — Other Ambulatory Visit (INDEPENDENT_AMBULATORY_CARE_PROVIDER_SITE_OTHER): Payer: Medicare Other

## 2011-09-05 DIAGNOSIS — Z9089 Acquired absence of other organs: Secondary | ICD-10-CM

## 2011-09-05 DIAGNOSIS — D649 Anemia, unspecified: Secondary | ICD-10-CM

## 2011-09-05 LAB — HEPATIC FUNCTION PANEL
AST: 40 U/L — ABNORMAL HIGH (ref 0–37)
Albumin: 4.2 g/dL (ref 3.5–5.2)
Alkaline Phosphatase: 51 U/L (ref 39–117)
Total Protein: 7.4 g/dL (ref 6.0–8.3)

## 2011-09-05 LAB — CBC WITH DIFFERENTIAL/PLATELET
Basophils Absolute: 0 10*3/uL (ref 0.0–0.1)
Basophils Relative: 0.3 % (ref 0.0–3.0)
Eosinophils Absolute: 0 10*3/uL (ref 0.0–0.7)
HCT: 29.6 % — ABNORMAL LOW (ref 36.0–46.0)
Hemoglobin: 9.9 g/dL — ABNORMAL LOW (ref 12.0–15.0)
Lymphocytes Relative: 19.3 % (ref 12.0–46.0)
Lymphs Abs: 1.4 10*3/uL (ref 0.7–4.0)
MCHC: 33.5 g/dL (ref 30.0–36.0)
Monocytes Relative: 5.1 % (ref 3.0–12.0)
Neutro Abs: 5.3 10*3/uL (ref 1.4–7.7)
RBC: 2.92 Mil/uL — ABNORMAL LOW (ref 3.87–5.11)
RDW: 13.7 % (ref 11.5–14.6)

## 2011-09-07 ENCOUNTER — Telehealth: Payer: Self-pay | Admitting: Internal Medicine

## 2011-09-07 NOTE — Telephone Encounter (Signed)
Please- cll patient - Hgb is good and liver functions normal. Copy of labs to Dr Gaynell Face at [facility]

## 2011-09-08 ENCOUNTER — Other Ambulatory Visit: Payer: Self-pay | Admitting: Internal Medicine

## 2011-09-08 DIAGNOSIS — Z9089 Acquired absence of other organs: Secondary | ICD-10-CM

## 2011-09-08 LAB — BASIC METABOLIC PANEL
CO2: 20 mEq/L (ref 19–32)
Calcium: 9.4 mg/dL (ref 8.4–10.5)
Creatinine, Ser: 1.9 mg/dL — ABNORMAL HIGH (ref 0.4–1.2)
Sodium: 137 mEq/L (ref 135–145)

## 2011-09-10 NOTE — Telephone Encounter (Signed)
Pt had kidney function done as well waiting on results from that

## 2011-10-07 NOTE — Telephone Encounter (Signed)
Labs have been mailed out to pt and sent to Dr Gaynell Face at [facility]

## 2011-10-08 ENCOUNTER — Other Ambulatory Visit (INDEPENDENT_AMBULATORY_CARE_PROVIDER_SITE_OTHER): Payer: Medicare Other

## 2011-10-08 DIAGNOSIS — Z9089 Acquired absence of other organs: Secondary | ICD-10-CM

## 2011-10-08 DIAGNOSIS — T887XXA Unspecified adverse effect of drug or medicament, initial encounter: Secondary | ICD-10-CM

## 2011-10-08 DIAGNOSIS — K921 Melena: Secondary | ICD-10-CM

## 2011-10-08 LAB — CBC WITH DIFFERENTIAL/PLATELET
Basophils Relative: 1.4 % (ref 0.0–3.0)
Eosinophils Absolute: 0.1 10*3/uL (ref 0.0–0.7)
Eosinophils Relative: 1 % (ref 0.0–5.0)
Hemoglobin: 9.5 g/dL — ABNORMAL LOW (ref 12.0–15.0)
Lymphocytes Relative: 21.6 % (ref 12.0–46.0)
Monocytes Relative: 6.2 % (ref 3.0–12.0)
Neutro Abs: 4.2 10*3/uL (ref 1.4–7.7)
Neutrophils Relative %: 69.8 % (ref 43.0–77.0)
RBC: 2.78 Mil/uL — ABNORMAL LOW (ref 3.87–5.11)
WBC: 6.1 10*3/uL (ref 4.5–10.5)

## 2011-10-08 LAB — BASIC METABOLIC PANEL
BUN: 36 mg/dL — ABNORMAL HIGH (ref 6–23)
Calcium: 9.4 mg/dL (ref 8.4–10.5)
Chloride: 109 mEq/L (ref 96–112)
Creatinine, Ser: 1.6 mg/dL — ABNORMAL HIGH (ref 0.4–1.2)

## 2011-10-08 LAB — HEPATIC FUNCTION PANEL
ALT: 30 U/L (ref 0–35)
AST: 38 U/L — ABNORMAL HIGH (ref 0–37)
Albumin: 4.2 g/dL (ref 3.5–5.2)
Alkaline Phosphatase: 50 U/L (ref 39–117)
Bilirubin, Direct: 0.1 mg/dL (ref 0.0–0.3)
Total Protein: 7.4 g/dL (ref 6.0–8.3)

## 2011-10-16 NOTE — Telephone Encounter (Signed)
Med refill

## 2011-10-24 ENCOUNTER — Other Ambulatory Visit: Payer: Self-pay | Admitting: Interventional Radiology

## 2011-10-24 ENCOUNTER — Other Ambulatory Visit (HOSPITAL_COMMUNITY): Payer: Self-pay | Admitting: Interventional Radiology

## 2011-10-31 ENCOUNTER — Other Ambulatory Visit: Payer: Self-pay | Admitting: Internal Medicine

## 2011-11-01 ENCOUNTER — Other Ambulatory Visit: Payer: Self-pay | Admitting: Internal Medicine

## 2011-11-13 ENCOUNTER — Other Ambulatory Visit (INDEPENDENT_AMBULATORY_CARE_PROVIDER_SITE_OTHER): Payer: Medicare Other

## 2011-11-13 ENCOUNTER — Ambulatory Visit (INDEPENDENT_AMBULATORY_CARE_PROVIDER_SITE_OTHER): Payer: Medicare Other | Admitting: Internal Medicine

## 2011-11-13 ENCOUNTER — Encounter: Payer: Self-pay | Admitting: Internal Medicine

## 2011-11-13 DIAGNOSIS — M359 Systemic involvement of connective tissue, unspecified: Secondary | ICD-10-CM

## 2011-11-13 DIAGNOSIS — D649 Anemia, unspecified: Secondary | ICD-10-CM

## 2011-11-13 DIAGNOSIS — K921 Melena: Secondary | ICD-10-CM

## 2011-11-13 DIAGNOSIS — C649 Malignant neoplasm of unspecified kidney, except renal pelvis: Secondary | ICD-10-CM

## 2011-11-13 DIAGNOSIS — I10 Essential (primary) hypertension: Secondary | ICD-10-CM

## 2011-11-13 DIAGNOSIS — I2789 Other specified pulmonary heart diseases: Secondary | ICD-10-CM

## 2011-11-13 LAB — COMPREHENSIVE METABOLIC PANEL
AST: 44 U/L — ABNORMAL HIGH (ref 0–37)
Alkaline Phosphatase: 60 U/L (ref 39–117)
Glucose, Bld: 87 mg/dL (ref 70–99)
Sodium: 138 mEq/L (ref 135–145)
Total Bilirubin: 0.7 mg/dL (ref 0.3–1.2)
Total Protein: 7.4 g/dL (ref 6.0–8.3)

## 2011-11-13 LAB — CBC WITH DIFFERENTIAL/PLATELET
Basophils Relative: 0.5 % (ref 0.0–3.0)
Eosinophils Relative: 1 % (ref 0.0–5.0)
HCT: 30.4 % — ABNORMAL LOW (ref 36.0–46.0)
Lymphs Abs: 1.5 10*3/uL (ref 0.7–4.0)
MCHC: 33.6 g/dL (ref 30.0–36.0)
MCV: 99.8 fl (ref 78.0–100.0)
Monocytes Absolute: 0.4 10*3/uL (ref 0.1–1.0)
Platelets: 203 10*3/uL (ref 150.0–400.0)
RBC: 3.04 Mil/uL — ABNORMAL LOW (ref 3.87–5.11)
WBC: 6.2 10*3/uL (ref 4.5–10.5)

## 2011-11-13 NOTE — Progress Notes (Signed)
  Subjective:    Patient ID: Paula Ward, female    DOB: [birth year], 76 y.o.   MRN: [id number]  HPI Mrs. Parr is seen acutely: she feel stuffy, depressed and tired. She has not had any fever, no rhinorrhea, no cough, no SOB. She is due for her routine liver function study for Dr. Gaynell Face. She also is due for follow up of anemia.  I have reviewed the patient's medical history in detail and updated the computerized patient record.    Review of Systems System review is negative for any constitutional, cardiac, pulmonary, GI or neuro symptoms or complaints other than as described in the HPI.     Objective:   Physical Exam Filed Vitals:   11/13/11 1135  BP: 118/68  Pulse: 83  Temp: 97.5 F (36.4 C)   Gen'l - WNWD white woman in no acute distress HEENT- C&S clear, PERRLA. No tenderness to percussion over frontal or maxillary sinus Neck - supple Pulm - normal respirations Cor - RRR Abd- soft Neuro - A&O x 3, CN II-XII normal, normal gait.       Assessment & Plan:

## 2011-11-15 NOTE — Assessment & Plan Note (Addendum)
Stable with no acute symptoms attributable to CREST

## 2011-11-15 NOTE — Assessment & Plan Note (Signed)
BP Readings from Last 3 Encounters:  11/13/11 118/68  06/26/11 138/60  06/11/11 123/46   Good control on present regimen

## 2011-11-15 NOTE — Assessment & Plan Note (Signed)
Doing well. She does have follow -up with Dr. Kathlene Cote on the schedule.

## 2011-11-15 NOTE — Assessment & Plan Note (Signed)
Followed by Dr. Gaynell Face and continues on Tracleer. She is for routine lab monitoring - LFTs. No change in condition.

## 2011-11-15 NOTE — Assessment & Plan Note (Signed)
Chronic problem.   Plan- follow up lab with recommendations to follow.  Addendum:  Lab Results  Component Value Date   HGB 10.2* 11/13/2011   In line with her history - no exacerebation.

## 2011-11-17 ENCOUNTER — Encounter: Payer: Self-pay | Admitting: Internal Medicine

## 2011-11-25 ENCOUNTER — Ambulatory Visit (HOSPITAL_COMMUNITY)
Admission: RE | Admit: 2011-11-25 | Discharge: 2011-11-25 | Disposition: A | Discharge status: Home / Self Care | Payer: Medicare Other | Source: Ambulatory Visit | Attending: Interventional Radiology | Admitting: Interventional Radiology

## 2011-11-25 ENCOUNTER — Ambulatory Visit
Admission: RE | Admit: 2011-11-25 | Discharge: 2011-11-25 | Disposition: A | Discharge status: Home / Self Care | Payer: Medicare Other | Source: Ambulatory Visit | Attending: Interventional Radiology | Admitting: Interventional Radiology

## 2011-11-25 DIAGNOSIS — C50919 Malignant neoplasm of unspecified site of unspecified female breast: Secondary | ICD-10-CM | POA: Insufficient documentation

## 2011-11-25 DIAGNOSIS — N281 Cyst of kidney, acquired: Secondary | ICD-10-CM | POA: Insufficient documentation

## 2011-11-25 DIAGNOSIS — C649 Malignant neoplasm of unspecified kidney, except renal pelvis: Secondary | ICD-10-CM | POA: Insufficient documentation

## 2011-11-27 ENCOUNTER — Other Ambulatory Visit: Payer: Self-pay | Admitting: Interventional Radiology

## 2011-12-02 ENCOUNTER — Encounter: Payer: Self-pay | Admitting: Internal Medicine

## 2011-12-02 ENCOUNTER — Ambulatory Visit (INDEPENDENT_AMBULATORY_CARE_PROVIDER_SITE_OTHER): Payer: Medicare Other | Admitting: Internal Medicine

## 2011-12-02 VITALS — BP 118/62 | HR 108 | Temp 99.4°F | Resp 16 | Wt 140.2 lb

## 2011-12-02 DIAGNOSIS — J069 Acute upper respiratory infection, unspecified: Secondary | ICD-10-CM

## 2011-12-02 NOTE — Progress Notes (Signed)
SUBJECTIVE:  Paula Ward is a 76 y.o. female who complains of congestion, sore throat, post nasal drip, productive cough, upper sinus pain, fever and pain while swallowing for 3 days. She denies a history of chills, nausea, vomiting and wheezing and denies a history of asthma. Patient does not smoke cigarettes.   OBJECTIVE: She appears well, vital signs are as noted. Ears normal.  Throat and pharynx normal.  Neck supple. No adenopathy in the neck. Nose is congested. Sinuses non tender. The chest is clear, without wheezes or rales.  ASSESSMENT:  viral upper respiratory illness  PLAN: Symptomatic therapy suggested: push fluids, rest, gargle warm salt water, use vaporizer or mist prn and return office visit prn if symptoms persist or worsen. Lack of antibiotic effectiveness discussed with her. Call or return to clinic prn if these symptoms worsen or fail to improve as anticipated.

## 2011-12-02 NOTE — Patient Instructions (Signed)
Viral Upper respiratory infection with no evidence of a bacterial infection. Thus, no indication for antibiotics.  Plan - supportive care: sudafed 30 mg twice a day; for cough Robitussin DM or the equivalent (guafenesin, dextromethorophan); stove top vaporizer treatments; tylenol 500 mg 1 or 2 three times a day for aches and low grade fever.   For persistent fever 100+, for a change in mucus to thick, bitter, colored in appearance, for shortness of breath or increased sinus pain please call.   Upper Respiratory Infection, Adult An upper respiratory infection (URI) is also sometimes known as the common cold. The upper respiratory tract includes the nose, sinuses, throat, trachea, and bronchi. Bronchi are the airways leading to the lungs. Most people improve within 1 week, but symptoms can last up to 2 weeks. A residual cough may last even longer.   CAUSES Many different viruses can infect the tissues lining the upper respiratory tract. The tissues become irritated and inflamed and often become very moist. Mucus production is also common. A cold is contagious. You can easily spread the virus to others by oral contact. This includes kissing, sharing a glass, coughing, or sneezing. Touching your mouth or nose and then touching a surface, which is then touched by another person, can also spread the virus. SYMPTOMS   Symptoms typically develop 1 to 3 days after you come in contact with a cold virus. Symptoms vary from person to person. They may include:  Runny nose.     Sneezing.    Nasal congestion.     Sinus irritation.     Sore throat.     Loss of voice (laryngitis).     Cough.    Fatigue.    Muscle aches.     Loss of appetite.     Headache.    Low-grade fever.  DIAGNOSIS   You might diagnose your own cold based on familiar symptoms, since most people get a cold 2 to 3 times a year. Your caregiver can confirm this based on your exam. Most importantly, your caregiver can check that  your symptoms are not due to another disease such as strep throat, sinusitis, pneumonia, asthma, or epiglottitis. Blood tests, throat tests, and X-rays are not necessary to diagnose a common cold, but they may sometimes be helpful in excluding other more serious diseases. Your caregiver will decide if any further tests are required. RISKS AND COMPLICATIONS   You may be at risk for a more severe case of the common cold if you smoke cigarettes, have chronic heart disease (such as heart failure) or lung disease (such as asthma), or if you have a weakened immune system. The very young and very old are also at risk for more serious infections. Bacterial sinusitis, middle ear infections, and bacterial pneumonia can complicate the common cold. The common cold can worsen asthma and chronic obstructive pulmonary disease (COPD). Sometimes, these complications can require emergency medical care and may be life-threatening. PREVENTION   The best way to protect against getting a cold is to practice good hygiene. Avoid oral or hand contact with people with cold symptoms. Wash your hands often if contact occurs. There is no clear evidence that vitamin C, vitamin E, echinacea, or exercise reduces the chance of developing a cold. However, it is always recommended to get plenty of rest and practice good nutrition. TREATMENT   Treatment is directed at relieving symptoms. There is no cure. Antibiotics are not effective, because the infection is caused by a virus, not by bacteria.  Treatment may include:  Increased fluid intake. Sports drinks offer valuable electrolytes, sugars, and fluids.     Breathing heated mist or steam (vaporizer or shower).     Eating chicken soup or other clear broths, and maintaining good nutrition.     Getting plenty of rest.     Using gargles or lozenges for comfort.     Controlling fevers with ibuprofen or acetaminophen as directed by your caregiver.     Increasing usage of your inhaler if  you have asthma.  Zinc gel and zinc lozenges, taken in the first 24 hours of the common cold, can shorten the duration and lessen the severity of symptoms. Pain medicines may help with fever, muscle aches, and throat pain. A variety of non-prescription medicines are available to treat congestion and runny nose. Your caregiver can make recommendations and may suggest nasal or lung inhalers for other symptoms.   HOME CARE INSTRUCTIONS    Only take over-the-counter or prescription medicines for pain, discomfort, or fever as directed by your caregiver.     Use a warm mist humidifier or inhale steam from a shower to increase air moisture. This may keep secretions moist and make it easier to breathe.     Drink enough water and fluids to keep your urine clear or pale yellow.     Rest as needed.     Return to work when your temperature has returned to normal or as your caregiver advises. You may need to stay home longer to avoid infecting others. You can also use a face mask and careful hand washing to prevent spread of the virus.  SEEK MEDICAL CARE IF:    After the first few days, you feel you are getting worse rather than better.     You need your caregiver's advice about medicines to control symptoms.     You develop chills, worsening shortness of breath, or Zurawski or red sputum. These may be signs of pneumonia.     You develop yellow or Odenthal nasal discharge or pain in the face, especially when you bend forward. These may be signs of sinusitis.     You develop a fever, swollen neck glands, pain with swallowing, or white areas in the back of your throat. These may be signs of strep throat.  SEEK IMMEDIATE MEDICAL CARE IF:    You have a fever.     You develop severe or persistent headache, ear pain, sinus pain, or chest pain.     You develop wheezing, a prolonged cough, cough up blood, or have a change in your usual mucus (if you have chronic lung disease).     You develop sore muscles or a  stiff neck.  Document Released: 04/01/2001 Document Revised: 06/18/2011 Document Reviewed: 02/07/2011 [facility] Patient Information 2012 [place].

## 2011-12-11 ENCOUNTER — Other Ambulatory Visit (INDEPENDENT_AMBULATORY_CARE_PROVIDER_SITE_OTHER): Payer: Medicare Other

## 2011-12-11 DIAGNOSIS — T887XXA Unspecified adverse effect of drug or medicament, initial encounter: Secondary | ICD-10-CM

## 2011-12-11 DIAGNOSIS — K921 Melena: Secondary | ICD-10-CM

## 2011-12-11 LAB — CBC WITH DIFFERENTIAL/PLATELET
Basophils Absolute: 0 10*3/uL (ref 0.0–0.1)
Eosinophils Absolute: 0.1 10*3/uL (ref 0.0–0.7)
MCHC: 33.6 g/dL (ref 30.0–36.0)
MCV: 97.6 fl (ref 78.0–100.0)
Monocytes Absolute: 0.5 10*3/uL (ref 0.1–1.0)
Neutrophils Relative %: 69.3 % (ref 43.0–77.0)
Platelets: 294 10*3/uL (ref 150.0–400.0)

## 2011-12-11 LAB — HEPATIC FUNCTION PANEL
Bilirubin, Direct: 0 mg/dL (ref 0.0–0.3)
Total Bilirubin: 0.3 mg/dL (ref 0.3–1.2)

## 2011-12-14 ENCOUNTER — Encounter: Payer: Self-pay | Admitting: Internal Medicine

## 2011-12-17 ENCOUNTER — Ambulatory Visit (INDEPENDENT_AMBULATORY_CARE_PROVIDER_SITE_OTHER): Payer: Medicare Other | Admitting: Internal Medicine

## 2011-12-17 ENCOUNTER — Encounter: Payer: Self-pay | Admitting: Internal Medicine

## 2011-12-17 VITALS — BP 138/52 | HR 96 | Temp 97.4°F | Resp 14 | Wt 141.5 lb

## 2011-12-17 DIAGNOSIS — R059 Cough, unspecified: Secondary | ICD-10-CM

## 2011-12-17 DIAGNOSIS — R05 Cough: Secondary | ICD-10-CM

## 2011-12-17 MED ORDER — PREDNISONE 10 MG PO TABS: ORAL_TABLET | ORAL | Status: DC

## 2011-12-17 MED ORDER — BENZONATATE 100 MG PO CAPS: 100.0000 mg | ORAL_CAPSULE | Freq: Three times a day (TID) | ORAL | Status: AC

## 2011-12-17 MED ORDER — AZITHROMYCIN 250 MG PO TABS: ORAL_TABLET | ORAL | Status: AC

## 2011-12-17 MED ORDER — PROMETHAZINE-CODEINE 6.25-10 MG/5ML PO SYRP: ORAL_SOLUTION | ORAL | Status: DC

## 2011-12-17 NOTE — Patient Instructions (Signed)
Persistent cough is most likely viral in origin, mycoplasma pneumoniae is a possibility, but this has set off a "cyclical cough" (irritation - cough-irritation-cough,etc).   Plan - a Z-pak for possible "walking pneumonia;" promethazine with codiene 1 tsp at bedtime, 1/2 tsp every 4 hours during the day as needed for severe cough; benzonatate perles 100 mg three times a day; if oK with Dr. Gaynell Face and your [facility] pharmacist - a short course of prednisone: 20 mg daily x 3, then 10 mg daily x 6.   If this fails to get you to doing better - return for Chest x-rays, lab work and if needed a pulmonary consult.

## 2011-12-18 ENCOUNTER — Ambulatory Visit: Payer: Medicare Other | Admitting: Internal Medicine

## 2011-12-18 NOTE — Progress Notes (Signed)
  Subjective:    Patient ID: Paula Ward, female    DOB: [birth year], 76 y.o.   MRN: [id number]  HPI Paula Ward presents for continued problem with dry cough. She has had interference with sleep, chest wall pain that is severe, malaise and fatigue. She has not had fever, chills, N/V. She denies marked increase in SOB/DOE. She was last seen Feb 12 diagnosed with a viral URI. She has been using otc medications including robitussin DM but has recently switched to Mucinex.  I have reviewed the patient's medical history in detail and updated the computerized patient record.    Review of Systems .menrosb     Objective:   Physical Exam Filed Vitals:   12/17/11 1610  BP: 138/52  Pulse: 96  Temp: 97.4 F (36.3 C)  Resp: 14   Wt Readings from Last 3 Encounters:  12/17/11 141 lb 8 oz (64.184 kg)  12/02/11 140 lb 4 oz (63.617 kg)  11/13/11 143 lb (64.864 kg)   Gen'l - pleasant white woman who does appear a bit haggard but in no acute distress HEENT - EACs/TMs normal, throat clear, no tenderness to percussion over frontal or maxillary sinus Pulm- chronic rales at right base but otherwise clear Cor - 2+ radial pulse,  RRR Neuro - A&O x 3, normal gait       Assessment & Plan:  Cough - no evidence of bacterial infection. She did raise the possibility of mycoplasma pneumoniae. There are features of a cyclical cough  Plan - Z-pak for possible mycoplasma           Promethazine/codiene 2 tsp qHS, 1/2 -1 tsp q 4 during the day           Benzonatate perles 100 mg tid           Prednisone - 84m qd x 3, 10 mg qd x 6 ([facility] ok [facility]           If not better - CXR, repeat lab, pulmonary referral (Dr. TGaynell Face

## 2012-01-05 ENCOUNTER — Other Ambulatory Visit (INDEPENDENT_AMBULATORY_CARE_PROVIDER_SITE_OTHER): Payer: Medicare Other

## 2012-01-05 DIAGNOSIS — T887XXA Unspecified adverse effect of drug or medicament, initial encounter: Secondary | ICD-10-CM

## 2012-01-05 DIAGNOSIS — K921 Melena: Secondary | ICD-10-CM

## 2012-01-05 LAB — CBC WITH DIFFERENTIAL/PLATELET
Basophils Relative: 0.9 % (ref 0.0–3.0)
Eosinophils Relative: 1.2 % (ref 0.0–5.0)
Hemoglobin: 10.1 g/dL — ABNORMAL LOW (ref 12.0–15.0)
Lymphocytes Relative: 18.9 % (ref 12.0–46.0)
MCHC: 32.5 g/dL (ref 30.0–36.0)
Monocytes Relative: 4.8 % (ref 3.0–12.0)
Neutro Abs: 6 10*3/uL (ref 1.4–7.7)
RBC: 3.16 Mil/uL — ABNORMAL LOW (ref 3.87–5.11)
WBC: 8.1 10*3/uL (ref 4.5–10.5)

## 2012-01-05 LAB — HEPATIC FUNCTION PANEL
ALT: 25 U/L (ref 0–35)
AST: 34 U/L (ref 0–37)
Albumin: 3.8 g/dL (ref 3.5–5.2)
Alkaline Phosphatase: 50 U/L (ref 39–117)
Total Protein: 7.1 g/dL (ref 6.0–8.3)

## 2012-01-06 ENCOUNTER — Encounter: Payer: Self-pay | Admitting: Internal Medicine

## 2012-01-23 ENCOUNTER — Telehealth: Payer: Self-pay | Admitting: Internal Medicine

## 2012-01-23 NOTE — Telephone Encounter (Signed)
Patient states that she got a letter from her insurance company that they will cover a prescription of omeprazole now but may not cover it past that. I have advised that that in order for me to get a prior authorization, there must first be a rejected claim. Patient states that it is not yet time for her to get refills on medicine. I have asked that she contact the pharmacy as normal several days before she needs the prescription and they will send me a note of request to complete prior authorization at that time. Patient verbalizes understanding.

## 2012-02-05 ENCOUNTER — Other Ambulatory Visit (INDEPENDENT_AMBULATORY_CARE_PROVIDER_SITE_OTHER): Payer: Medicare Other

## 2012-02-05 DIAGNOSIS — K921 Melena: Secondary | ICD-10-CM

## 2012-02-05 DIAGNOSIS — T887XXA Unspecified adverse effect of drug or medicament, initial encounter: Secondary | ICD-10-CM

## 2012-02-05 LAB — HEPATIC FUNCTION PANEL
AST: 48 U/L — ABNORMAL HIGH (ref 0–37)
Alkaline Phosphatase: 55 U/L (ref 39–117)
Total Bilirubin: 0.4 mg/dL (ref 0.3–1.2)

## 2012-02-05 LAB — CBC WITH DIFFERENTIAL/PLATELET
Basophils Absolute: 0 10*3/uL (ref 0.0–0.1)
Eosinophils Absolute: 0 10*3/uL (ref 0.0–0.7)
Lymphocytes Relative: 23.7 % (ref 12.0–46.0)
MCHC: 33.5 g/dL (ref 30.0–36.0)
Monocytes Relative: 6.5 % (ref 3.0–12.0)
Platelets: 174 10*3/uL (ref 150.0–400.0)
RDW: 15.3 % — ABNORMAL HIGH (ref 11.5–14.6)

## 2012-02-15 ENCOUNTER — Encounter: Payer: Self-pay | Admitting: Internal Medicine

## 2012-02-16 ENCOUNTER — Encounter: Payer: Self-pay | Admitting: Internal Medicine

## 2012-02-16 NOTE — Progress Notes (Signed)
Case ID:  [id number]  Member Number:  [id number] Case Type:  Initial Review  Case Start Date:  02/16/2012 Case Status:  Coverage has been APPROVED. You will receive a confirmation letter confirming approval of this medication. The patient will also be notified of this approval via an automated outbound phone call or a letter. Please allow approximately 2 hours to update our system with the approval. Once updated, the prescription can be re-submitted. Coverage Start Date:  01/25/2012  Coverage End Date:  02/15/2013  Patient First Name:  Paula [facility]  Patient Last Name:  Ward  DOB:  Dec 28, 1935 Patient Street Address:  [place]  Patient State:    Patient Zip:  [phone]  Drug Name & Strength:  OMEPRAZOLE 40 MG CAPSULE DR

## 2012-02-17 ENCOUNTER — Telehealth: Payer: Self-pay | Admitting: *Deleted

## 2012-02-17 NOTE — Telephone Encounter (Signed)
Left smg on vm requesting labs results that was done 2 weeks ago. Called pt back ilet her know md mail letter yesterday so she should be getting letter soon. She wanting to know the hemoglobin levels gave result... 02/17/12_0 :44pm/LMB

## 2012-02-18 ENCOUNTER — Telehealth: Payer: Self-pay

## 2012-02-18 NOTE — Telephone Encounter (Signed)
Faxed letter (Lab results) per request MD.

## 2012-03-03 ENCOUNTER — Other Ambulatory Visit (INDEPENDENT_AMBULATORY_CARE_PROVIDER_SITE_OTHER): Payer: Medicare Other

## 2012-03-03 DIAGNOSIS — T887XXA Unspecified adverse effect of drug or medicament, initial encounter: Secondary | ICD-10-CM

## 2012-03-03 DIAGNOSIS — K921 Melena: Secondary | ICD-10-CM

## 2012-03-03 LAB — HEPATIC FUNCTION PANEL
ALT: 36 U/L — ABNORMAL HIGH (ref 0–35)
AST: 43 U/L — ABNORMAL HIGH (ref 0–37)
Albumin: 4 g/dL (ref 3.5–5.2)
Total Bilirubin: 0.5 mg/dL (ref 0.3–1.2)
Total Protein: 7.2 g/dL (ref 6.0–8.3)

## 2012-03-03 LAB — CBC WITH DIFFERENTIAL/PLATELET
Eosinophils Relative: 0.6 % (ref 0.0–5.0)
HCT: 30.7 % — ABNORMAL LOW (ref 36.0–46.0)
Hemoglobin: 10.3 g/dL — ABNORMAL LOW (ref 12.0–15.0)
Lymphs Abs: 1.8 10*3/uL (ref 0.7–4.0)
MCV: 97.4 fl (ref 78.0–100.0)
Monocytes Absolute: 0.5 10*3/uL (ref 0.1–1.0)
Monocytes Relative: 7.6 % (ref 3.0–12.0)
Neutro Abs: 4.2 10*3/uL (ref 1.4–7.7)
Platelets: 199 10*3/uL (ref 150.0–400.0)
RDW: 15.3 % — ABNORMAL HIGH (ref 11.5–14.6)
WBC: 6.5 10*3/uL (ref 4.5–10.5)

## 2012-03-05 ENCOUNTER — Telehealth: Payer: Self-pay | Admitting: *Deleted

## 2012-03-05 NOTE — Telephone Encounter (Signed)
Patient notified of lab result. Copy of labs sent to Dr. Gaynell Face.

## 2012-03-25 ENCOUNTER — Other Ambulatory Visit: Payer: Self-pay | Admitting: Interventional Radiology

## 2012-03-25 DIAGNOSIS — N2889 Other specified disorders of kidney and ureter: Secondary | ICD-10-CM

## 2012-04-05 ENCOUNTER — Other Ambulatory Visit (INDEPENDENT_AMBULATORY_CARE_PROVIDER_SITE_OTHER): Payer: Medicare Other

## 2012-04-05 ENCOUNTER — Encounter (HOSPITAL_COMMUNITY): Payer: Self-pay

## 2012-04-05 ENCOUNTER — Encounter: Payer: Self-pay | Admitting: Internal Medicine

## 2012-04-05 ENCOUNTER — Telehealth: Payer: Self-pay | Admitting: *Deleted

## 2012-04-05 ENCOUNTER — Other Ambulatory Visit: Payer: Self-pay | Admitting: *Deleted

## 2012-04-05 ENCOUNTER — Ambulatory Visit (INDEPENDENT_AMBULATORY_CARE_PROVIDER_SITE_OTHER): Payer: Medicare Other | Admitting: Internal Medicine

## 2012-04-05 ENCOUNTER — Inpatient Hospital Stay (HOSPITAL_COMMUNITY)
Admission: AD | Admit: 2012-04-05 | Discharge: 2012-04-06 | DRG: 378 | Disposition: A | Discharge status: Home / Self Care | Payer: Medicare Other | Source: Ambulatory Visit | Attending: Internal Medicine | Admitting: Internal Medicine

## 2012-04-05 VITALS — BP 114/58 | HR 100 | Temp 98.0°F | Resp 16 | Ht 65.0 in | Wt 138.0 lb

## 2012-04-05 DIAGNOSIS — K219 Gastro-esophageal reflux disease without esophagitis: Secondary | ICD-10-CM | POA: Diagnosis present

## 2012-04-05 DIAGNOSIS — I2789 Other specified pulmonary heart diseases: Secondary | ICD-10-CM | POA: Diagnosis present

## 2012-04-05 DIAGNOSIS — D649 Anemia, unspecified: Secondary | ICD-10-CM

## 2012-04-05 DIAGNOSIS — N189 Chronic kidney disease, unspecified: Secondary | ICD-10-CM | POA: Diagnosis present

## 2012-04-05 DIAGNOSIS — I271 Kyphoscoliotic heart disease: Secondary | ICD-10-CM

## 2012-04-05 DIAGNOSIS — Z79899 Other long term (current) drug therapy: Secondary | ICD-10-CM

## 2012-04-05 DIAGNOSIS — I129 Hypertensive chronic kidney disease with stage 1 through stage 4 chronic kidney disease, or unspecified chronic kidney disease: Secondary | ICD-10-CM | POA: Diagnosis present

## 2012-04-05 DIAGNOSIS — D62 Acute posthemorrhagic anemia: Secondary | ICD-10-CM | POA: Diagnosis present

## 2012-04-05 DIAGNOSIS — Z853 Personal history of malignant neoplasm of breast: Secondary | ICD-10-CM

## 2012-04-05 DIAGNOSIS — K31819 Angiodysplasia of stomach and duodenum without bleeding: Secondary | ICD-10-CM

## 2012-04-05 DIAGNOSIS — Z86711 Personal history of pulmonary embolism: Secondary | ICD-10-CM

## 2012-04-05 DIAGNOSIS — K5521 Angiodysplasia of colon with hemorrhage: Principal | ICD-10-CM | POA: Diagnosis present

## 2012-04-05 LAB — CBC WITH DIFFERENTIAL/PLATELET
Basophils Relative: 0.5 % (ref 0.0–3.0)
Eosinophils Absolute: 0 10*3/uL (ref 0.0–0.7)
Hemoglobin: 7.6 g/dL — CL (ref 12.0–15.0)
MCHC: 33.1 g/dL (ref 30.0–36.0)
MCV: 99 fl (ref 78.0–100.0)
Monocytes Absolute: 0.4 10*3/uL (ref 0.1–1.0)
Neutro Abs: 4.2 10*3/uL (ref 1.4–7.7)
RBC: 2.33 Mil/uL — ABNORMAL LOW (ref 3.87–5.11)

## 2012-04-05 LAB — HEPATIC FUNCTION PANEL
Albumin: 3.8 g/dL (ref 3.5–5.2)
Total Protein: 7.1 g/dL (ref 6.0–8.3)

## 2012-04-05 MED ORDER — PANTOPRAZOLE SODIUM 40 MG PO TBEC
40.0000 mg | DELAYED_RELEASE_TABLET | Freq: Every day | ORAL | Status: DC
  Administered 2012-04-06: 40 mg via ORAL
  Filled 2012-04-05: qty 1

## 2012-04-05 MED ORDER — SODIUM CHLORIDE 0.9 % IV SOLN
INTRAVENOUS | Status: DC
  Administered 2012-04-05: 16:00:00 via INTRAVENOUS

## 2012-04-05 MED ORDER — MECLIZINE HCL 12.5 MG PO TABS
12.5000 mg | ORAL_TABLET | Freq: Three times a day (TID) | ORAL | Status: DC | PRN
  Filled 2012-04-05: qty 1

## 2012-04-05 MED ORDER — FUROSEMIDE 10 MG/ML IJ SOLN
20.0000 mg | Freq: Once | INTRAMUSCULAR | Status: AC
  Administered 2012-04-06: 20 mg via INTRAVENOUS
  Filled 2012-04-05: qty 2

## 2012-04-05 MED ORDER — DILTIAZEM HCL ER COATED BEADS 120 MG PO CP24
120.0000 mg | ORAL_CAPSULE | Freq: Every day | ORAL | Status: DC
  Administered 2012-04-06: 120 mg via ORAL
  Filled 2012-04-05 (×2): qty 1

## 2012-04-05 MED ORDER — ALPRAZOLAM 0.25 MG PO TABS: 0.1250 mg | ORAL_TABLET | Freq: Every evening | ORAL | Status: DC | PRN

## 2012-04-05 MED ORDER — SPIRONOLACTONE 50 MG PO TABS
50.0000 mg | ORAL_TABLET | Freq: Every day | ORAL | Status: DC
  Administered 2012-04-06: 50 mg via ORAL
  Filled 2012-04-05 (×2): qty 1

## 2012-04-05 MED ORDER — BOSENTAN 125 MG PO TABS
125.0000 mg | ORAL_TABLET | Freq: Two times a day (BID) | ORAL | Status: DC
  Filled 2012-04-05 (×3): qty 1

## 2012-04-05 MED ORDER — MECLIZINE HCL 12.5 MG PO TABS: 12.5000 mg | ORAL_TABLET | Freq: Three times a day (TID) | ORAL | Status: AC | PRN

## 2012-04-05 MED ORDER — BOSENTAN 125 MG PO TABS
125.0000 mg | ORAL_TABLET | Freq: Two times a day (BID) | ORAL | Status: DC
  Filled 2012-04-05: qty 1

## 2012-04-05 MED ORDER — MECLIZINE HCL 12.5 MG PO TABS
12.5000 mg | ORAL_TABLET | Freq: Three times a day (TID) | ORAL | Status: DC
  Administered 2012-04-06 (×3): 12.5 mg via ORAL
  Filled 2012-04-05 (×4): qty 1

## 2012-04-05 MED ORDER — TORSEMIDE 20 MG PO TABS
20.0000 mg | ORAL_TABLET | Freq: Every day | ORAL | Status: DC
  Administered 2012-04-06: 20 mg via ORAL
  Filled 2012-04-05 (×2): qty 1

## 2012-04-05 NOTE — Patient Instructions (Addendum)
1. Dizziness - this looks like inner ear = labyrinthitis.  Plan - meclizine 12.5 mg every 6 hours as needed.  2. Sinus congestion - no evidence of bacterial infection. Plan - sudafed 30 mg twice a day or at most three times a day  3. Dark Stool - there is blood in the stool. May be a bleed from an AVM (red spot in intestine) Plan - lab/CBC with recommendations to follow.  4. Arm pain - normal exam. May be muscle related or possibly related to cervical spine. For persistent or worsening symptoms will re-evaluate  Labyrinthitis (Inner Ear Inflammation) Your exam shows you have an inner ear disturbance or labyrinthitis. The cause of this condition is not known. But it may be due to a virus infection. The symptoms of labyrinthitis include vertigo or dizziness made worse by motion, nausea and vomiting. The onset of labyrinthitis may be very sudden. It usually lasts for a few days and then clears up over 1-2 weeks. The treatment of an inner ear disturbance includes bed rest and medications to reduce dizziness, nausea, and vomiting. You should stay away from alcohol, tranquilizers, caffeine, nicotine, or any medicine your doctor thinks may make your symptoms worse. Further testing may be needed to evaluate your hearing and balance system. Please see your doctor or go to the emergency room right away if you have:  Increasing vertigo, earache, loss of hearing, or ear drainage.   Headache, blurred vision, trouble walking, fainting, or fever.   Persistent vomiting, dehydration, or extreme weakness.  Document Released: 10/06/2005 Document Revised: 09/25/2011 Document Reviewed: 03/24/2007 [facility] Patient Information 2012 [place].

## 2012-04-05 NOTE — Progress Notes (Signed)
Subjective:    Patient ID: Paula Ward, female    DOB: May 26, 1936, 76 y.o.   MRN: [id number]  HPI Mrs. Findlay presents for a problem over the weekend with dizziness and dysequilibrium. She cannot look down and she is off balance. No falls. No muscle weakness, no paresthesia.  She has a recent h/o teeth pain - saw dentist and full w/u was negative. It was suggested this was a sinus infection: she denies drainage but did have some sinus pressure, and pain in the posterior cervical region. She did take otc sudafed with some relief.  She reports that she has had black stools. She has been on iron for six months but the stools have suddenly changed.   Past Medical History  Diagnosis Date  . Anemia   . Pulmonary embolus 2003  . CREST syndrome   . GERD (gastroesophageal reflux disease)   . Angiodysplasia of stomach and duodenum   . Arthus phenomenon   . Pulmonary hypertension   . Interstitial lung disease   . Scleroderma   . Rectal incontinence   . AVM (arteriovenous malformation)   . Nodule of kidney   . Uterine polyp   . PONV (postoperative nausea and vomiting)   . Hiatal hernia   . Chronic kidney disease     Chronic mild renal insuffiency  . Cancer 1989    Left Breast  . Breast cancer   . Hypertension     Severe Pulmonary HTN  . Hypertension     seen at [facility] q 65mo Dr. TGaynell Face  Past Surgical History  Procedure Date  . Tonsillectomy   . Appendectomy 1962  . Breast lumpectomy 1989    left  . Ivc filter   . Kidney surgery     left    Family History  Problem Relation Age of Onset  . Cancer Father     bladder  . Diabetes Father   . Prostate cancer Father   . Alzheimer's disease Mother   . Colon cancer Neg Hx   . Lung cancer Sister   . Diabetes Sister   . Cancer Sister     lung cancer- smoker   History   Social History  . Marital Status: Married    Spouse Name: N/A    Number of Children: N/A  . Years of Education: N/A   Occupational History  . Retired      Social History Main Topics  . Smoking status: Never Smoker   . Smokeless tobacco: Never Used  . Alcohol Use: No  . Drug Use: No  . Sexually Active: Not on file   Other Topics Concern  . Not on file   Social History Narrative   Married '611 son- '65, 1 daughter '63; 6 children (2 adopted)SO- SOBRetirement- doing wellMarriage in good healthPatient has never smokedAlcohol use- noPt gets regular exercise    Current Outpatient Prescriptions on File Prior to Visit  Medication Sig Dispense Refill  . ALPRAZolam (XANAX) 0.25 MG tablet Take 0.125 mg by mouth at bedtime as needed.       . bosentan (TRACLEER) 125 MG tablet Take 125 mg by mouth 2 (two) times daily.        . calcium citrate-vitamin D 200-200 MG-UNIT TABS Take 1 tablet by mouth daily.      .Marland Kitchendiltiazem (CARDIZEM CD) 120 MG 24 hr capsule Take 120 mg by mouth daily.        .Marland Kitchenomeprazole (PRILOSEC) 40 MG capsule Take 1 capsule (40  mg total) by mouth 2 (two) times daily.  60 capsule  10  . spironolactone (ALDACTONE) 50 MG tablet Take 50 mg by mouth daily.        Marland Kitchen torsemide (DEMADEX) 20 MG tablet Take 20 mg by mouth daily.        Current Facility-Administered Medications on File Prior to Visit  Medication Dose Route Frequency Provider Last Rate Last Dose  . TDaP (BOOSTRIX) injection 0.5 mL  0.5 mL Intramuscular Once Neena Rhymes, MD          Review of Systems System review is negative for any constitutional, cardiac, pulmonary, GI or neuro symptoms or complaints other than as described in the HPI.     Objective:   Physical Exam Filed Vitals:   04/05/12 1051  BP: 114/58  Pulse: 100  Temp: 98 F (36.7 C)  Resp: 16   Gen'l WNWD white woman in no distress HEENT- tender to percussion over the maxillary sinus. TMs normal Cor - RRR Pulm - normal respirations Abd - soft, non-tender Rectal - NST, empty vault, scant stool and mucus Heme positive Neuro - CN II-XII normal, PERRLA, EOMI, MAE       Assessment & Plan:   Dizziness - most likely labyrinthitis and anemia acute.  Plan - meclizine 12.5 mg q 6

## 2012-04-05 NOTE — H&P (Signed)
Paula Ward is an 76 y.o. female.   Chief Complaint: Weakness and new dark stools HPI: Paula Ward has a history of AVMs. She reports that her stools had become very dark over the past several days. She has also felt weak and dizzy. She does report that her  Dizziness is positional. She has not had hematemesis frank hematochezia, or abdominal pain. In the office she had heme-positive stools. Lab revealed a Hgb = 7.6 g down from her baseline of 10g. She is for 24 hr obs for transfusion.  Past Medical History  Diagnosis Date  . Anemia   . Pulmonary embolus 2003  . CREST syndrome   . GERD (gastroesophageal reflux disease)   . Angiodysplasia of stomach and duodenum   . Arthus phenomenon   . Pulmonary hypertension   . Interstitial lung disease   . Scleroderma   . Rectal incontinence   . AVM (arteriovenous malformation)   . Nodule of kidney   . Uterine polyp   . PONV (postoperative nausea and vomiting)   . Hiatal hernia   . Chronic kidney disease     Chronic mild renal insuffiency  . Cancer 1989    Left Breast  . Breast cancer   . Hypertension     Severe Pulmonary HTN  . Hypertension     seen at [facility] q 19mo Dr. TGaynell Face   Past Surgical History  Procedure Date  . Tonsillectomy   . Appendectomy 1962  . Breast lumpectomy 1989    left  . Ivc filter   . Kidney surgery     left     Family History  Problem Relation Age of Onset  . Cancer Father     bladder  . Diabetes Father   . Prostate cancer Father   . Alzheimer's disease Mother   . Colon cancer Neg Hx   . Lung cancer Sister   . Diabetes Sister   . Cancer Sister     lung cancer- smoker   Social History:  reports that she has never smoked. She has never used smokeless tobacco. She reports that she does not drink alcohol or use illicit drugs.  Allergies:  Allergies  Allergen Reactions  . Codeine Nausea Only    Hallucinations, too  . Other Nausea And Vomiting    "-mycin" antibiotics.   Also cause hallucinations.      Medications Prior to Admission  Medication Sig Dispense Refill  . ALPRAZolam (XANAX) 0.25 MG tablet Take 0.125 mg by mouth at bedtime as needed.       . bosentan (TRACLEER) 125 MG tablet Take 125 mg by mouth 2 (two) times daily.       . calcium citrate-vitamin D 200-200 MG-UNIT TABS Take 1 tablet by mouth daily.      .Marland Kitchendiltiazem (CARDIZEM CD) 120 MG 24 hr capsule Take 120 mg by mouth daily.        . Iron-Vitamins (GERITOL PO) Take by mouth.      . meclizine (ANTIVERT) 12.5 MG tablet Take 1 tablet (12.5 mg total) by mouth 3 (three) times daily as needed.  30 tablet  2  . omeprazole (PRILOSEC) 40 MG capsule Take 1 capsule (40 mg total) by mouth 2 (two) times daily.  60 capsule  10  . pseudoephedrine (SUDAFED) 30 MG tablet Take 30 mg by mouth every 4 (four) hours as needed. Congestion.      .Marland Kitchenspironolactone (ALDACTONE) 50 MG tablet Take 50 mg by mouth daily.        .Marland Kitchen  torsemide (DEMADEX) 20 MG tablet Take 20 mg by mouth daily.         Results for orders placed during the hospital encounter of 04/05/12 (from the past 48 hour(s))  PREPARE RBC (CROSSMATCH)     Status: Normal   Collection Time   04/05/12  4:00 PM      Component Value Range Comment   Order Confirmation ORDER PROCESSED BY BLOOD BANK     TYPE AND SCREEN     Status: Normal (Preliminary result)   Collection Time   04/05/12  4:06 PM      Component Value Range Comment   ABO/RH(D) A POS      Antibody Screen NEG      Sample Expiration 04/08/2012      Unit Number [id number]      Blood Component Type RED CELLS,LR      Unit division 00      Status of Unit ALLOCATED      Transfusion Status OK TO TRANSFUSE      Crossmatch Result Compatible      Unit Number [id number]      Blood Component Type RED CELLS,LR      Unit division 00      Status of Unit ISSUED      Transfusion Status OK TO TRANSFUSE      Crossmatch Result Compatible      No results found.  Review of Systems  Constitutional: Positive for malaise/fatigue. Negative for  fever, chills and weight loss.  HENT: Negative for congestion, sore throat and neck pain.   Eyes: Negative.   Respiratory: Negative for cough, sputum production and shortness of breath.   Cardiovascular: Negative for chest pain, palpitations and leg swelling.  Gastrointestinal: Positive for blood in stool. Negative for heartburn, nausea, vomiting, abdominal pain and melena.  Genitourinary: Negative.   Musculoskeletal: Negative for myalgias and joint pain.  Skin: Negative.   Neurological: Positive for dizziness and weakness. Negative for tingling, tremors, focal weakness and headaches.  Endo/Heme/Allergies: Does not bruise/bleed easily.  Psychiatric/Behavioral: Negative.     Blood pressure 122/57, pulse 85, temperature 98.6 F (37 C), temperature source Oral, resp. rate 20, height _0  (1.651 m), weight 139 lb (63.05 kg), SpO2 94.00%. Physical Exam  Gen'l WNWD white woman in no actue distress HEENT- C&S clear Neck - supple Cor - 2+ radial pulse, RRR  Pulm - normal respirations Abd- BS+, soft, no guarding rebound or tenderness Rectal - NST, scant stool in the vault, heme positive Ext - normal Neuro - CN II-XII normal, PERLA, EOMI, no nystagmus, Rapid movement like turning over - dizziness. MS 5/5, Assessment/Plan 1. GI- patient with blood in stool but no pain or discomfort. She has known AVM which is the presumed cause of her drop in Hgb. Plan -  Transfuse 2 units PRBCs  F/u H/H  If she remains stable no GI consult at this time  2. Dizziness - multi-factorial: anemia plus labyrinthitis Paln Transfuse  Meclizine.  3. Pulm - stable, routine labs with LFTs normal today.   Paula Ward 04/05/2012, 7:42 PM

## 2012-04-05 NOTE — Assessment & Plan Note (Signed)
Hgb 7.6 - admitted for transfusion

## 2012-04-05 NOTE — Telephone Encounter (Signed)
[facility] called with critical-Hgb 7.3 HCT-23

## 2012-04-05 NOTE — Assessment & Plan Note (Signed)
Probable cause of blood loss.

## 2012-04-06 ENCOUNTER — Other Ambulatory Visit: Payer: Self-pay | Admitting: Internal Medicine

## 2012-04-06 DIAGNOSIS — R5381 Other malaise: Secondary | ICD-10-CM

## 2012-04-06 DIAGNOSIS — D649 Anemia, unspecified: Secondary | ICD-10-CM

## 2012-04-06 DIAGNOSIS — R5383 Other fatigue: Secondary | ICD-10-CM

## 2012-04-06 LAB — TYPE AND SCREEN: Antibody Screen: NEGATIVE

## 2012-04-06 LAB — BASIC METABOLIC PANEL
CO2: 21 mEq/L (ref 19–32)
Calcium: 9.1 mg/dL (ref 8.4–10.5)
Creatinine, Ser: 1.4 mg/dL — ABNORMAL HIGH (ref 0.50–1.10)
GFR calc Af Amer: 42 mL/min — ABNORMAL LOW (ref >90)
GFR calc non Af Amer: 36 mL/min — ABNORMAL LOW (ref >90)
Sodium: 137 mEq/L (ref 135–145)

## 2012-04-06 LAB — CBC
MCH: 31.4 pg (ref 26.0–34.0)
MCHC: 33.1 g/dL (ref 30.0–36.0)
MCV: 94.8 fL (ref 78.0–100.0)
Platelets: 194 10*3/uL (ref 150–400)
RDW: 17.2 % — ABNORMAL HIGH (ref 11.5–15.5)

## 2012-04-06 LAB — HEMOGLOBIN AND HEMATOCRIT, BLOOD
HCT: 29.9 % — ABNORMAL LOW (ref 36.0–46.0)
Hemoglobin: 9.9 g/dL — ABNORMAL LOW (ref 12.0–15.0)

## 2012-04-06 NOTE — Progress Notes (Signed)
DC to home. To car by wc. No change from AM assessment. Dennies dizziness at present.

## 2012-04-06 NOTE — Progress Notes (Signed)
Did well during the day. PM H/H stable. For d/c home Dictation # [phone]

## 2012-04-06 NOTE — Progress Notes (Signed)
Did well overnight. No sign of continued bleeding.  Plan - H/H @ 1600. If stable and no acitve bleeding will d/c.

## 2012-04-07 NOTE — Discharge Summary (Signed)
Paula Ward, Paula Ward               ACCOUNT NO.:  [id number]  MEDICAL RECORD NO.:  [id number]  LOCATION:  5643                         FACILITY:  [facility]  PHYSICIAN:  Heinz Knuckles. Tilmon Wisehart, MD  DATE OF BIRTH:  Jan 25, 1936  DATE OF ADMISSION:  04/05/2012 DATE OF DISCHARGE:  04/06/2012                              DISCHARGE SUMMARY   ADMITTING DIAGNOSES: 1. Acute anemia on chronic anemia. 2. History of arteriovenous malformations, bleeding source. 3. Pulmonary hypertension.  DISCHARGE DIAGNOSES: 1. Acute anemia on chronic anemia. 2. History of arteriovenous malformations, bleeding source. 3. Pulmonary hypertension.  CONSULTANTS:  None.  PROCEDURES:  None.  HISTORY OF PRESENT ILLNESS:  [name] is a delightful 76 year old woman followed for chronic pulmonary hypertension and known chronic anemia secondary to previously diagnosed angiodysplasia involving the GI tract. The patient presented to the office on the day of admission, complaining of increasing dizziness, weakness, and having had dark stools that was acute change.  In the office, her evaluation was consistent with labyrinthitis as a cause of her dizziness, although possibly exacerbated by what turned out to be a significant anemia with a hemoglobin of 7.6 g, down from baseline of approximately 10 g.  Because of her acute blood loss and increasing dizziness, she was admitted to the hospital for a transfusion.  Please see the H and P for past medical history, family history, social history, and examination.  HOSPITAL COURSE: 1. Anemia.  The patient with blood loss secondary to AVMs.  She was     typed crossed and transfused 2 units of packed cells with a rise in     hemoglobin of 9.6 g on the morning of discharge.  On the afternoon     of discharge, her hemoglobin was 9.9 g.  She had no evidence of     ongoing bleeding, although by her report, her stool was dark today.     With the patient being transfused with a hemoglobin  being stable     over 12 hours, at this point, she is ready to be discharged to     home. 2. Dizziness.  The patient with a working diagnosis of labyrinthitis.     She does get relief with meclizine.  The patient reports she still     is having occasional intermittent episodes of dizziness that are     brief in duration.  Plan is she will continue on meclizine at home.  DISCHARGE PHYSICAL EXAMINATION:  VITAL SIGNS: Temperature was 97.9, blood pressure was 116/63, pulse 82, respirations 16, O2 saturations 97% on room air. GENERAL APPEARANCE:  A well-nourished, well-developed woman, in no acute distress. LUNGS:  The patient is moving air well with no rales or wheezes. CARDIOVASCULAR:  2+ radial pulse.  She had a quiet precordium with a regular rate and rhythm. ABDOMEN:  Soft.  No guarding or rebound was appreciated.  No further examination conducted.  FINAL LABORATORIES:  From 1555 hours on the day of discharge, hemoglobin 9.9 g.  DISPOSITION:  The patient to be discharged home.  FOLLOWUP:  The patient will return to the office on the April 09, 2012, for followup H and H.  She will call for  any additional problems.  The patient's condition at the time of discharge dictation is stable and improved.  Thank you very much for your assistance.     Heinz Knuckles Davit Vassar, MD     MEN/MEDQ  D:  04/06/2012  T:  04/07/2012  Job:  [id number]

## 2012-04-09 ENCOUNTER — Other Ambulatory Visit (INDEPENDENT_AMBULATORY_CARE_PROVIDER_SITE_OTHER): Payer: Medicare Other

## 2012-04-09 DIAGNOSIS — D649 Anemia, unspecified: Secondary | ICD-10-CM

## 2012-04-09 LAB — CBC WITH DIFFERENTIAL/PLATELET
Eosinophils Absolute: 0.1 10*3/uL (ref 0.0–0.7)
Eosinophils Relative: 0.8 % (ref 0.0–5.0)
HCT: 30.1 % — ABNORMAL LOW (ref 36.0–46.0)
Lymphs Abs: 1.6 10*3/uL (ref 0.7–4.0)
MCHC: 33.8 g/dL (ref 30.0–36.0)
MCV: 96.2 fl (ref 78.0–100.0)
Monocytes Absolute: 0.5 10*3/uL (ref 0.1–1.0)
Platelets: 232 10*3/uL (ref 150.0–400.0)
WBC: 7 10*3/uL (ref 4.5–10.5)

## 2012-04-13 ENCOUNTER — Telehealth: Payer: Self-pay

## 2012-04-13 NOTE — Telephone Encounter (Signed)
Hgb 10.2 g - good

## 2012-04-13 NOTE — Telephone Encounter (Signed)
Message copied by Lyman Bishop on Tue Apr 13, 2012 11:45 AM ------      Message from: Luella Cook      Created: Tue Apr 13, 2012 10:20 AM      Contact: Pt       Caller: Onda/Patient; Phone Number: [phone]; Message from caller: Please call pt with her blood count from last Friday 04/09/12.  She advises you may leave message if she is unavailable.

## 2012-04-14 NOTE — Telephone Encounter (Signed)
Left message with husband, Eara Burruel of lab result

## 2012-04-27 ENCOUNTER — Telehealth: Payer: Self-pay | Admitting: *Deleted

## 2012-04-27 DIAGNOSIS — I2789 Other specified pulmonary heart diseases: Secondary | ICD-10-CM

## 2012-04-27 DIAGNOSIS — I10 Essential (primary) hypertension: Secondary | ICD-10-CM

## 2012-04-27 NOTE — Telephone Encounter (Signed)
Called [facility] and left message with Tammy of lab work to be added to order for July 17th

## 2012-04-27 NOTE — Telephone Encounter (Signed)
Paula Ward with [facility] called to request lab work for a patient. Patient states she has a standing order for labs from [name]. No lab work seen in system for standing lab work. Patient is having a MRI on 06/15/2012 and needs BUN and Creatinine done also prior to MRI, please advise

## 2012-04-27 NOTE — Telephone Encounter (Signed)
Paula Ward is supposed to have CBC and Hepatic panel every month for monitoring with results to Dr. Gaynell Face. Next due July 17.  Will add metabolic panel for July 62BW

## 2012-05-03 ENCOUNTER — Other Ambulatory Visit (INDEPENDENT_AMBULATORY_CARE_PROVIDER_SITE_OTHER): Payer: Medicare Other

## 2012-05-03 DIAGNOSIS — I2789 Other specified pulmonary heart diseases: Secondary | ICD-10-CM

## 2012-05-03 DIAGNOSIS — I271 Kyphoscoliotic heart disease: Secondary | ICD-10-CM

## 2012-05-03 DIAGNOSIS — I10 Essential (primary) hypertension: Secondary | ICD-10-CM

## 2012-05-03 LAB — CBC WITH DIFFERENTIAL/PLATELET
Eosinophils Absolute: 0 10*3/uL (ref 0.0–0.7)
Eosinophils Relative: 0.7 % (ref 0.0–5.0)
Lymphocytes Relative: 26.5 % (ref 12.0–46.0)
MCHC: 33.2 g/dL (ref 30.0–36.0)
MCV: 97.5 fl (ref 78.0–100.0)
Monocytes Absolute: 0.3 10*3/uL (ref 0.1–1.0)
Neutrophils Relative %: 66.4 % (ref 43.0–77.0)
Platelets: 185 10*3/uL (ref 150.0–400.0)
WBC: 5 10*3/uL (ref 4.5–10.5)

## 2012-05-03 LAB — HEPATIC FUNCTION PANEL
Bilirubin, Direct: 0 mg/dL (ref 0.0–0.3)
Total Bilirubin: 0.5 mg/dL (ref 0.3–1.2)

## 2012-05-03 LAB — COMPREHENSIVE METABOLIC PANEL
Albumin: 3.8 g/dL (ref 3.5–5.2)
BUN: 32 mg/dL — ABNORMAL HIGH (ref 6–23)
Calcium: 9.2 mg/dL (ref 8.4–10.5)
Chloride: 107 mEq/L (ref 96–112)
GFR: 41.49 mL/min — ABNORMAL LOW (ref >60.00)
Glucose, Bld: 115 mg/dL — ABNORMAL HIGH (ref 70–99)
Potassium: 4.6 mEq/L (ref 3.5–5.1)

## 2012-05-07 ENCOUNTER — Telehealth: Payer: Self-pay | Admitting: Internal Medicine

## 2012-05-07 NOTE — Telephone Encounter (Deleted)
Caller: Janna/Patient; PCP: [name]; CB#: [phone]; ; ; Call regarding Urinary Symptoms;  onset PM 05/06/12.  c/o urgency, frequency.   Afebrile.  Denies blood in urine or flank pain.  Per protocol, emergent symptoms denied; advised appt within 24 hours.  No appts available per Epic.  Patient declines appt; is leaving for trip now and requests antibiotic be called in.  Allergy to sulfa, erythromycin.  INFO TO OFFICE FOR PROVIDER REVIEW/RX/CALLBACK.   WANTS RX CALLED [id number] [phone]. MAY REACH PATIENT AT [phone].

## 2012-05-07 NOTE — Telephone Encounter (Signed)
Caller: Kinza/Patient; PCP: Adella Hare; CB#: [phone]; ; ; Call regarding Lab Results;  had labwork done 05/03/12.  Per [name]' note in [place] 05/04/12, Hgb is stable.  Results were sent to Dr. Gaynell Face at [facility].  Patient advised; no further questions or concerns.

## 2012-05-10 ENCOUNTER — Encounter: Payer: Self-pay | Admitting: Internal Medicine

## 2012-05-24 ENCOUNTER — Other Ambulatory Visit: Payer: Self-pay | Admitting: Internal Medicine

## 2012-06-04 ENCOUNTER — Other Ambulatory Visit (INDEPENDENT_AMBULATORY_CARE_PROVIDER_SITE_OTHER): Payer: Medicare Other

## 2012-06-04 DIAGNOSIS — I2789 Other specified pulmonary heart diseases: Secondary | ICD-10-CM

## 2012-06-04 LAB — HEPATIC FUNCTION PANEL
ALT: 28 U/L (ref 0–35)
AST: 42 U/L — ABNORMAL HIGH (ref 0–37)
Albumin: 4.2 g/dL (ref 3.5–5.2)
Alkaline Phosphatase: 47 U/L (ref 39–117)
Bilirubin, Direct: 0.1 mg/dL (ref 0.0–0.3)
Total Bilirubin: 0.5 mg/dL (ref 0.3–1.2)
Total Protein: 7.3 g/dL (ref 6.0–8.3)

## 2012-06-04 LAB — CBC WITH DIFFERENTIAL/PLATELET
Basophils Relative: 0.5 % (ref 0.0–3.0)
Eosinophils Absolute: 0 10*3/uL (ref 0.0–0.7)
Eosinophils Relative: 0.5 % (ref 0.0–5.0)
Lymphocytes Relative: 24.2 % (ref 12.0–46.0)
MCHC: 32.6 g/dL (ref 30.0–36.0)
Neutrophils Relative %: 69 % (ref 43.0–77.0)
RBC: 3.19 Mil/uL — ABNORMAL LOW (ref 3.87–5.11)
WBC: 6.8 10*3/uL (ref 4.5–10.5)

## 2012-06-11 ENCOUNTER — Telehealth: Payer: Self-pay | Admitting: Internal Medicine

## 2012-06-11 NOTE — Telephone Encounter (Signed)
Caller: Elizabeth/Patient; Phone: [phone]; Reason for Call: Please call pt with lab results, particularly her hemoglobin.

## 2012-06-14 NOTE — Telephone Encounter (Signed)
Patient notified of lab result and copy of lab results faxed to Dr. Gaynell Face and [facility].

## 2012-06-15 ENCOUNTER — Telehealth: Payer: Self-pay | Admitting: *Deleted

## 2012-06-15 ENCOUNTER — Other Ambulatory Visit: Payer: Self-pay | Admitting: Interventional Radiology

## 2012-06-15 ENCOUNTER — Ambulatory Visit
Admission: RE | Admit: 2012-06-15 | Discharge: 2012-06-15 | Disposition: A | Discharge status: Home / Self Care | Payer: Medicare Other | Source: Ambulatory Visit | Attending: Interventional Radiology | Admitting: Interventional Radiology

## 2012-06-15 ENCOUNTER — Ambulatory Visit (HOSPITAL_COMMUNITY)
Admission: RE | Admit: 2012-06-15 | Discharge: 2012-06-15 | Disposition: A | Discharge status: Home / Self Care | Payer: Medicare Other | Source: Ambulatory Visit | Attending: Interventional Radiology | Admitting: Interventional Radiology

## 2012-06-15 DIAGNOSIS — R161 Splenomegaly, not elsewhere classified: Secondary | ICD-10-CM | POA: Insufficient documentation

## 2012-06-15 DIAGNOSIS — Q619 Cystic kidney disease, unspecified: Secondary | ICD-10-CM | POA: Insufficient documentation

## 2012-06-15 DIAGNOSIS — N289 Disorder of kidney and ureter, unspecified: Secondary | ICD-10-CM | POA: Insufficient documentation

## 2012-06-15 DIAGNOSIS — N2889 Other specified disorders of kidney and ureter: Secondary | ICD-10-CM

## 2012-06-15 DIAGNOSIS — Z853 Personal history of malignant neoplasm of breast: Secondary | ICD-10-CM | POA: Insufficient documentation

## 2012-06-15 LAB — POCT I-STAT, CHEM 8
Calcium, Ion: 1.19 mmol/L (ref 1.13–1.30)
Chloride: 106 mEq/L (ref 96–112)
Glucose, Bld: 92 mg/dL (ref 70–99)
HCT: 34 % — ABNORMAL LOW (ref 36.0–46.0)

## 2012-06-15 NOTE — Progress Notes (Signed)
Denies hematuria or any other problems with urination.  Denies pain associated w/ procedure.  States that overall she is doing well.

## 2012-06-15 NOTE — Telephone Encounter (Signed)
Message copied by Ellene Route on Tue Jun 15, 2012  8:56 AM ------      Message from: Neena Rhymes      Created: Sun Jun 13, 2012  6:26 PM       Call patient - labs are stable. Send copy to Dr. Gaynell Face at [facility].

## 2012-06-15 NOTE — Telephone Encounter (Signed)
pateint notified of lab results and faxed copies of labs to Dr, Gaynell Face and [facility]

## 2012-06-16 ENCOUNTER — Encounter: Payer: Self-pay | Admitting: Internal Medicine

## 2012-06-16 NOTE — Progress Notes (Addendum)
Denies hematuria or any other urinary problems.  Denies pain associated with procedure.

## 2012-06-19 NOTE — Progress Notes (Signed)
Patient ID: JOHNNYE SANDFORD, female   DOB: December 25, 1935, 76 y.o.   MRN: [id number]  ESTABLISHED PATIENT OFFICE VISIT  Chief Complaint: Status post percutaneous cryoablation of left renal cell carcinoma on 04/11/2011.  History: Mrs. Suleiman returns for follow-up. She has had no symptoms referable to the left kidney since treatment.  Review of Systems: The patient denies flank pain, hematuria, dysuria or fever.  Exam: Vital signs: Blood pressure 114/50, pulse 85, respirations 15, temperature 98, oxygen saturation 98% on room air. Abdomen: Soft and nontender. No flank tenderness.  Labs: BUN 29, creatinine 1.6.  Imaging: Follow-up MRI was performed earlier today and demonstrates the stable ablation defect of the left kidney without evidence of recurrence. Gadolinium was not administered due to renal insufficiency.  Assessment and Plan: I met with Mrs. Doerner and her husband. We reviewed imaging findings. We elected not to give Gadolinium for current follow-up MRI given the latest renal function. We will again check renal function in 1 year prior to the follow-up. Hopefully at some point we will be able to give a half-dose of gadolinium to better evaluate the ablation site. I have recommended imaging next June or July at which time the patient will be 2 years post-treatment.

## 2012-06-24 ENCOUNTER — Other Ambulatory Visit: Payer: Self-pay | Admitting: Internal Medicine

## 2012-06-25 ENCOUNTER — Other Ambulatory Visit: Payer: Self-pay | Admitting: Internal Medicine

## 2012-06-28 ENCOUNTER — Other Ambulatory Visit: Payer: Self-pay | Admitting: Internal Medicine

## 2012-06-29 ENCOUNTER — Telehealth: Payer: Self-pay | Admitting: Internal Medicine

## 2012-06-29 ENCOUNTER — Other Ambulatory Visit: Payer: Self-pay | Admitting: Internal Medicine

## 2012-06-29 NOTE — Telephone Encounter (Signed)
Patient advised that rx was sent yesterday. She has scheduled an appointment with Dr Olevia Perches for her first available date, 08-03-12.

## 2012-07-05 ENCOUNTER — Other Ambulatory Visit (INDEPENDENT_AMBULATORY_CARE_PROVIDER_SITE_OTHER): Payer: Medicare Other

## 2012-07-05 DIAGNOSIS — I271 Kyphoscoliotic heart disease: Secondary | ICD-10-CM

## 2012-07-05 DIAGNOSIS — I2789 Other specified pulmonary heart diseases: Secondary | ICD-10-CM

## 2012-07-05 LAB — CBC WITH DIFFERENTIAL/PLATELET
Basophils Absolute: 0 10*3/uL (ref 0.0–0.1)
Eosinophils Absolute: 0 10*3/uL (ref 0.0–0.7)
HCT: 31.2 % — ABNORMAL LOW (ref 36.0–46.0)
Lymphs Abs: 1.4 10*3/uL (ref 0.7–4.0)
MCV: 99.6 fl (ref 78.0–100.0)
Monocytes Absolute: 0.4 10*3/uL (ref 0.1–1.0)
Monocytes Relative: 6.7 % (ref 3.0–12.0)
Platelets: 181 10*3/uL (ref 150.0–400.0)
RDW: 14.6 % (ref 11.5–14.6)

## 2012-07-05 LAB — HEPATIC FUNCTION PANEL: Total Bilirubin: 0.8 mg/dL (ref 0.3–1.2)

## 2012-07-12 ENCOUNTER — Telehealth: Payer: Self-pay | Admitting: *Deleted

## 2012-07-12 NOTE — Telephone Encounter (Signed)
Message copied by Ellene Route on Mon Jul 12, 2012  1:35 PM ------      Message from: Neena Rhymes      Created: Sun Jul 11, 2012  5:15 AM       Call patient with reuslts - Hgb is stable, liver functions are normal. Send copy of lab to Dr. Gaynell Face at [facility].

## 2012-07-12 NOTE — Telephone Encounter (Signed)
Lab results faxed to Dr,.Tapson at [facility] [place]. Left message on patient home # of results and faxed to Dr. Gaynell Face

## 2012-07-15 ENCOUNTER — Encounter: Payer: Self-pay | Admitting: *Deleted

## 2012-07-28 ENCOUNTER — Other Ambulatory Visit: Payer: Self-pay | Admitting: Internal Medicine

## 2012-07-30 ENCOUNTER — Other Ambulatory Visit: Payer: Self-pay | Admitting: Internal Medicine

## 2012-07-30 NOTE — Telephone Encounter (Signed)
Patient reqeust refill on promethazine/codiene . Please advise on [name] patient. Last OV 03/2012

## 2012-08-03 ENCOUNTER — Telehealth: Payer: Self-pay | Admitting: Internal Medicine

## 2012-08-03 ENCOUNTER — Ambulatory Visit: Payer: Medicare Other | Admitting: Internal Medicine

## 2012-08-03 ENCOUNTER — Encounter: Payer: Self-pay | Admitting: Internal Medicine

## 2012-08-03 ENCOUNTER — Ambulatory Visit (INDEPENDENT_AMBULATORY_CARE_PROVIDER_SITE_OTHER): Payer: Medicare Other | Admitting: Internal Medicine

## 2012-08-03 ENCOUNTER — Other Ambulatory Visit (INDEPENDENT_AMBULATORY_CARE_PROVIDER_SITE_OTHER): Payer: Medicare Other

## 2012-08-03 VITALS — BP 104/52 | HR 96 | Ht 63.75 in | Wt 136.0 lb

## 2012-08-03 DIAGNOSIS — R6889 Other general symptoms and signs: Secondary | ICD-10-CM

## 2012-08-03 DIAGNOSIS — K5520 Angiodysplasia of colon without hemorrhage: Secondary | ICD-10-CM

## 2012-08-03 DIAGNOSIS — M349 Systemic sclerosis, unspecified: Secondary | ICD-10-CM

## 2012-08-03 DIAGNOSIS — R195 Other fecal abnormalities: Secondary | ICD-10-CM

## 2012-08-03 DIAGNOSIS — M341 CR(E)ST syndrome: Secondary | ICD-10-CM

## 2012-08-03 DIAGNOSIS — I2789 Other specified pulmonary heart diseases: Secondary | ICD-10-CM

## 2012-08-03 DIAGNOSIS — D649 Anemia, unspecified: Secondary | ICD-10-CM

## 2012-08-03 DIAGNOSIS — I271 Kyphoscoliotic heart disease: Secondary | ICD-10-CM

## 2012-08-03 LAB — CBC WITH DIFFERENTIAL/PLATELET
Basophils Absolute: 0 10*3/uL (ref 0.0–0.1)
Basophils Relative: 0.2 % (ref 0.0–3.0)
Eosinophils Absolute: 0.1 10*3/uL (ref 0.0–0.7)
Eosinophils Relative: 0.9 % (ref 0.0–5.0)
HCT: 32.1 % — ABNORMAL LOW (ref 36.0–46.0)
Hemoglobin: 10.4 g/dL — ABNORMAL LOW (ref 12.0–15.0)
Lymphocytes Relative: 23.2 % (ref 12.0–46.0)
Lymphs Abs: 1.7 10*3/uL (ref 0.7–4.0)
MCHC: 32.4 g/dL (ref 30.0–36.0)
MCV: 99.3 fl (ref 78.0–100.0)
Monocytes Absolute: 0.5 10*3/uL (ref 0.1–1.0)
Monocytes Relative: 6.6 % (ref 3.0–12.0)
Neutro Abs: 5 10*3/uL (ref 1.4–7.7)
Neutrophils Relative %: 69.1 % (ref 43.0–77.0)
Platelets: 233 10*3/uL (ref 150.0–400.0)
RBC: 3.23 Mil/uL — ABNORMAL LOW (ref 3.87–5.11)
RDW: 14 % (ref 11.5–14.6)
WBC: 7.2 10*3/uL (ref 4.5–10.5)

## 2012-08-03 LAB — IBC PANEL
Iron: 49 ug/dL (ref 42–145)
Saturation Ratios: 15.5 % — ABNORMAL LOW (ref 20.0–50.0)
Transferrin: 226.2 mg/dL (ref 212.0–360.0)

## 2012-08-03 MED ORDER — OMEPRAZOLE 40 MG PO CPDR: 40.0000 mg | DELAYED_RELEASE_CAPSULE | Freq: Two times a day (BID) | ORAL | Status: DC

## 2012-08-03 NOTE — Patient Instructions (Addendum)
Your physician has requested that you go to the basement for the following lab work before leaving today: IBC, B12 We have sent the following medications to your pharmacy for you to pick up at your convenience: Omeprazole CC: Dr Adella Hare

## 2012-08-03 NOTE — Telephone Encounter (Signed)
Patient states she sick with a cold and was wondering if she would be finished with the OV at 3:30 by 4:15 PM. Told patient I did not think she would be finished that quickly. She states she had been added to [name] at 4:15 and thought she might not be finished. She wants to keep our OV.

## 2012-08-03 NOTE — Progress Notes (Signed)
Paula Ward [birth year] MRN [id number]   History of Present Illness:  This is a 76 year old white female with CREST syndrome,pulmonar hypertension followed at [facility], and AVMs of the small bowel, stomach and colon resulting in chronic low-grade GI blood loss. Her last blood transfusion was in June 2013. Her last upper endoscopy in October 2009 and subsequently in June 2011 showed AVMs in the stomach and duodenum.She had a prior APC ablation. Her colonoscopies in 2001, 2005 and in July 2010 showed AVMs in the right colon and sigmoid colon. She has been on chronic oral iron supplements. Patient's last iron infusion was 2 years ago. Her last hemoglobin was 10.1 with a hematocrit of 31.2. She is here to refill her Prilosec 40 mg twice a day. She had a renal cell carcinoma in 2012 and is status post radioactive ablation. There is currently no active disease by MRI.   Past Medical History  Diagnosis Date  . Anemia   . Pulmonary embolus 2003  . CREST syndrome   . GERD (gastroesophageal reflux disease)   . Angiodysplasia of stomach and duodenum   . Arthus phenomenon   . Pulmonary hypertension   . Interstitial lung disease   . Scleroderma   . Rectal incontinence   . AVM (arteriovenous malformation)   . Nodule of kidney   . Uterine polyp   . PONV (postoperative nausea and vomiting)   . Hiatal hernia   . Chronic kidney disease     Chronic mild renal insuffiency  . Breast cancer 1989    Left  . Hypertension     Severe Pulmonary HTN  . Hypertension     seen at [facility] q 1mo Dr. TGaynell Face . Renal cell carcinoma    Past Surgical History  Procedure Date  . Tonsillectomy   . Appendectomy 1962  . Breast lumpectomy 1989    left  . Ivc filter   . Kidney surgery     left     reports that she has never smoked. She has never used smokeless tobacco. She reports that she does not drink alcohol or use illicit drugs. family history includes Alzheimer's disease in her mother; Cancer in her father and  sister; Diabetes in her father and sister; Lung cancer in her sister; and Prostate cancer in her father.  There is no history of Colon cancer. Allergies  Allergen Reactions  . Codeine Nausea Only    Hallucinations, too  . Other Nausea And Vomiting    "-mycin" antibiotics.   Also cause hallucinations.        Review of Systems: Negative for dysphagia heartburn abdominal pain  The remainder of the 10 point ROS is negative except as outlined in H&P   Physical Exam: General appearance  Well developed, in no distress. Eyes- non icteric. HEENT nontraumatic, normocephalic. Mouth no lesions, tongue papillated, no cheilosis. No AVM's in oral cavity Neck supple without adenopathy, thyroid not enlarged, no carotid bruits, no JVD. Lungs Clear to auscultation bilaterally. Cor normal S1, normal S2, regular rhythm, no murmur,  quiet precordium. Abdomen: Soft nontender with normoactive bowel sounds.  Rectal: Trace Hemoccult-positive stool. Extremities no pedal edema. Skin no lesions. Multiple AVMs on the upper extremities, lower extremities and torso. Neurological alert and oriented x 3. Psychological normal mood and affect.  Assessment and Plan:  Problem #1 Crest syndrome with AV malformation of the upper and lower GI tract. Her most recent H&H was  stable. We will check her iron stores today to see if she  needs an iron infusion. She is up-to-date on her upper endoscopy and colonoscopy. We will refill her omeprazole 40 mg twice a day.   08/03/2012 Delfin Edis

## 2012-08-04 ENCOUNTER — Ambulatory Visit (INDEPENDENT_AMBULATORY_CARE_PROVIDER_SITE_OTHER): Payer: Medicare Other | Admitting: Internal Medicine

## 2012-08-04 ENCOUNTER — Other Ambulatory Visit: Payer: Self-pay | Admitting: *Deleted

## 2012-08-04 ENCOUNTER — Encounter: Payer: Self-pay | Admitting: Internal Medicine

## 2012-08-04 VITALS — BP 122/58 | HR 84 | Temp 99.0°F | Resp 16 | Wt 136.0 lb

## 2012-08-04 DIAGNOSIS — D649 Anemia, unspecified: Secondary | ICD-10-CM

## 2012-08-04 DIAGNOSIS — J069 Acute upper respiratory infection, unspecified: Secondary | ICD-10-CM

## 2012-08-04 MED ORDER — AZITHROMYCIN 250 MG PO TABS: ORAL_TABLET | ORAL | Status: DC

## 2012-08-04 MED ORDER — PROMETHAZINE-CODEINE 6.25-10 MG/5ML PO SYRP
5.0000 mL | ORAL_SOLUTION | ORAL | Status: DC | PRN
Start: 2012-08-04 — End: 2013-04-29

## 2012-08-04 NOTE — Progress Notes (Signed)
Subjective:    Patient ID: Paula Ward, female    DOB: 25-Oct-1935, 76 y.o.   MRN: [id number]  HPI Starting last Saturday she had cough and laryngitis and post-nasal drainage. She has low grade fever, she has diffuse myalgias, her cough is producing a clear sputum. She was seen at [facility] on [address], diagnosed with URI - no treatment was otherwise provided. She has also been very fatigued. No nausea or vomiting, no diarrhea. No shortness of breath.   Past Medical History  Diagnosis Date  . Anemia   . Pulmonary embolus 2003  . CREST syndrome   . GERD (gastroesophageal reflux disease)   . Angiodysplasia of stomach and duodenum   . Arthus phenomenon   . Pulmonary hypertension   . Interstitial lung disease   . Scleroderma   . Rectal incontinence   . AVM (arteriovenous malformation)   . Nodule of kidney   . Uterine polyp   . PONV (postoperative nausea and vomiting)   . Hiatal hernia   . Chronic kidney disease     Chronic mild renal insuffiency  . Breast cancer 1989    Left  . Hypertension     Severe Pulmonary HTN  . Hypertension     seen at [facility] q 63mo Dr. TGaynell Face . Renal cell carcinoma    Past Surgical History  Procedure Date  . Tonsillectomy   . Appendectomy 1962  . Breast lumpectomy 1989    left  . Ivc filter   . Kidney surgery     left    Family History  Problem Relation Age of Onset  . Cancer Father     bladder  . Diabetes Father   . Prostate cancer Father   . Alzheimer's disease Mother   . Colon cancer Neg Hx   . Lung cancer Sister   . Diabetes Sister   . Cancer Sister     lung cancer- smoker   History   Social History  . Marital Status: Married    Spouse Name: N/A    Number of Children: N/A  . Years of Education: N/A   Occupational History  . Retired    Social History Main Topics  . Smoking status: Never Smoker   . Smokeless tobacco: Never Used  . Alcohol Use: No  . Drug Use: No  . Sexually Active: No   Other Topics Concern  .  Not on file   Social History Narrative   Married '611 son- '65, 1 daughter '63; 6 children (2 adopted)SO- SOBRetirement- doing wellMarriage in good healthPatient has never smokedAlcohol use- noPt gets regular exercise    Current Outpatient Prescriptions on File Prior to Visit  Medication Sig Dispense Refill  . ALPRAZolam (XANAX) 0.25 MG tablet Take 0.125 mg by mouth at bedtime as needed.       . bosentan (TRACLEER) 125 MG tablet Take 125 mg by mouth 2 (two) times daily.       . calcium citrate-vitamin D 200-200 MG-UNIT TABS Take 1 tablet by mouth daily.      .Marland Kitchendiltiazem (CARDIZEM CD) 120 MG 24 hr capsule Take 120 mg by mouth daily.        . Iron-Vitamins (GERITOL PO) Take by mouth.      .Marland Kitchenomeprazole (PRILOSEC) 40 MG capsule Take 1 capsule (40 mg total) by mouth 2 (two) times daily.  60 capsule  10  . pseudoephedrine (SUDAFED) 30 MG tablet Take 30 mg by mouth every 4 (four) hours as  needed. Congestion.      Marland Kitchen spironolactone (ALDACTONE) 50 MG tablet Take 50 mg by mouth daily.             Review of Systems System review is negative for any constitutional, cardiac, pulmonary, GI or neuro symptoms or complaints other than as described in the HPI.     Objective:   Physical Exam Filed Vitals:   08/04/12 1544  BP: 122/58  Pulse: 84  Temp: 99 F (37.2 C)  Resp: 16   Gen'l - WNWD white woman in no acute distress HEENT - no sinus tenderness to percussion Nodes - negative submandibular and cervical Cor- RRR Pulm - crackles at the bases (chronic) otherwise clear       Assessment & Plan:  URI - most likely viral but cannot absolutely rule out atypical infection  Plan Z-pak  otc Antihistamine, e.g. claritin 10 mg once a day to dry the drip  Phenergan with codiene cough syrup 1 tsp every 4 hours as needed.  Hydrate, take vitamin C 1500 mg daily, ok to use ecchinacea.

## 2012-08-04 NOTE — Patient Instructions (Addendum)
URI - most likely viral but cannot absolutely rule out atypical infection  Plan Z-pak  otc Antihistamine, e.g. claritin 10 mg once a day to dry the drip  Phenergan with codiene cough syrup 1 tsp every 4 hours as needed.  Hydrate, take vitamin C 1500 mg daily, ok to use ecchinacea.

## 2012-08-09 ENCOUNTER — Ambulatory Visit (INDEPENDENT_AMBULATORY_CARE_PROVIDER_SITE_OTHER): Payer: Medicare Other | Admitting: Internal Medicine

## 2012-08-09 ENCOUNTER — Encounter: Payer: Self-pay | Admitting: Internal Medicine

## 2012-08-09 VITALS — BP 112/60 | HR 83 | Temp 98.2°F | Resp 16 | Wt 136.0 lb

## 2012-08-09 DIAGNOSIS — R071 Chest pain on breathing: Secondary | ICD-10-CM

## 2012-08-09 DIAGNOSIS — R0789 Other chest pain: Secondary | ICD-10-CM

## 2012-08-09 DIAGNOSIS — S298XXA Other specified injuries of thorax, initial encounter: Secondary | ICD-10-CM
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
## 2012-08-11 ENCOUNTER — Ambulatory Visit (INDEPENDENT_AMBULATORY_CARE_PROVIDER_SITE_OTHER)
Admission: RE | Admit: 2012-08-11 | Discharge: 2012-08-11 | Disposition: A | Discharge status: Home / Self Care | Payer: Medicare Other | Source: Ambulatory Visit | Attending: Internal Medicine | Admitting: Internal Medicine

## 2012-08-11 DIAGNOSIS — S298XXA Other specified injuries of thorax, initial encounter: Secondary | ICD-10-CM

## 2012-08-17 ENCOUNTER — Encounter (HOSPITAL_COMMUNITY): Payer: Medicare Other

## 2012-08-26 ENCOUNTER — Encounter (HOSPITAL_COMMUNITY): Payer: Self-pay

## 2012-08-26 ENCOUNTER — Encounter (HOSPITAL_COMMUNITY)
Admission: RE | Admit: 2012-08-26 | Discharge: 2012-08-26 | Disposition: A | Discharge status: Home / Self Care | Payer: Medicare Other | Source: Ambulatory Visit | Attending: Internal Medicine | Admitting: Internal Medicine

## 2012-08-26 VITALS — BP 90/72 | HR 78 | Temp 98.7°F | Resp 18 | Ht 64.0 in | Wt 136.0 lb

## 2012-08-26 DIAGNOSIS — D649 Anemia, unspecified: Secondary | ICD-10-CM | POA: Insufficient documentation

## 2012-08-26 MED ORDER — DIPHENHYDRAMINE HCL 50 MG/ML IJ SOLN
25.0000 mg | Freq: Once | INTRAMUSCULAR | Status: AC
  Administered 2012-08-26: 25 mg via INTRAVENOUS
  Filled 2012-08-26: qty 1

## 2012-08-26 MED ORDER — SODIUM CHLORIDE 0.9 % IV SOLN
1000.0000 mg | Freq: Once | INTRAVENOUS | Status: AC
  Administered 2012-08-26: 1000 mg via INTRAVENOUS
  Filled 2012-08-26: qty 20

## 2012-08-26 MED ORDER — SODIUM CHLORIDE 0.9 % IV SOLN
Freq: Once | INTRAVENOUS | Status: AC
  Administered 2012-08-26: 10:00:00 via INTRAVENOUS

## 2012-08-26 NOTE — Progress Notes (Signed)
Spoke with the nurse who has related that the patient has had iron in the last 3-5 years.  Noted on [facility]fed order that the patient has had iron in the past and did not require a test dose. The most recent hgb=10.4 from 08/03/12.

## 2012-09-01 ENCOUNTER — Telehealth: Payer: Self-pay

## 2012-09-01 ENCOUNTER — Telehealth: Payer: Self-pay | Admitting: Internal Medicine

## 2012-09-01 DIAGNOSIS — N1832 Chronic kidney disease, stage 3b: Secondary | ICD-10-CM | POA: Insufficient documentation

## 2012-09-01 DIAGNOSIS — N289 Disorder of kidney and ureter, unspecified: Secondary | ICD-10-CM

## 2012-09-01 DIAGNOSIS — N183 Chronic kidney disease, stage 3 unspecified: Secondary | ICD-10-CM | POA: Insufficient documentation

## 2012-09-01 NOTE — Telephone Encounter (Signed)
Pt called stating she had a yearly visit with MD at [facility] and her Creatinine levels were elevated. Pt says she was told to request Dr Linda Hedges order  monthly lab draw to monitor levels, please advise.

## 2012-09-01 NOTE — Telephone Encounter (Signed)
Asked patient to get xanax from Dr Linda Hedges if she needs it. She verbalizes understanding.

## 2012-09-01 NOTE — Telephone Encounter (Signed)
Pt's spouse informed

## 2012-09-01 NOTE — Telephone Encounter (Signed)
Standing order entered for monthly Bmet

## 2012-09-06 ENCOUNTER — Other Ambulatory Visit (INDEPENDENT_AMBULATORY_CARE_PROVIDER_SITE_OTHER): Payer: Medicare Other

## 2012-09-06 DIAGNOSIS — I271 Kyphoscoliotic heart disease: Secondary | ICD-10-CM

## 2012-09-06 DIAGNOSIS — I2789 Other specified pulmonary heart diseases: Secondary | ICD-10-CM

## 2012-09-06 DIAGNOSIS — N289 Disorder of kidney and ureter, unspecified: Secondary | ICD-10-CM

## 2012-09-06 LAB — CBC WITH DIFFERENTIAL/PLATELET
Basophils Absolute: 0.1 10*3/uL (ref 0.0–0.1)
Basophils Relative: 1.5 % (ref 0.0–3.0)
Eosinophils Absolute: 0.1 10*3/uL (ref 0.0–0.7)
Eosinophils Relative: 1 % (ref 0.0–5.0)
HCT: 31.4 % — ABNORMAL LOW (ref 36.0–46.0)
Hemoglobin: 10.2 g/dL — ABNORMAL LOW (ref 12.0–15.0)
Lymphocytes Relative: 28.4 % (ref 12.0–46.0)
Lymphs Abs: 1.6 10*3/uL (ref 0.7–4.0)
MCHC: 32.6 g/dL (ref 30.0–36.0)
MCV: 100.9 fl — ABNORMAL HIGH (ref 78.0–100.0)
Monocytes Absolute: 0.4 10*3/uL (ref 0.1–1.0)
Monocytes Relative: 6.8 % (ref 3.0–12.0)
Neutro Abs: 3.5 10*3/uL (ref 1.4–7.7)
Neutrophils Relative %: 62.3 % (ref 43.0–77.0)
Platelets: 194 10*3/uL (ref 150.0–400.0)
RBC: 3.11 Mil/uL — ABNORMAL LOW (ref 3.87–5.11)
RDW: 14.4 % (ref 11.5–14.6)
WBC: 5.7 10*3/uL (ref 4.5–10.5)

## 2012-09-06 LAB — HEPATIC FUNCTION PANEL
Alkaline Phosphatase: 46 U/L (ref 39–117)
Bilirubin, Direct: 0.1 mg/dL (ref 0.0–0.3)
Total Bilirubin: 0.5 mg/dL (ref 0.3–1.2)
Total Protein: 7.2 g/dL (ref 6.0–8.3)

## 2012-09-06 LAB — COMPREHENSIVE METABOLIC PANEL
AST: 46 U/L — ABNORMAL HIGH (ref 0–37)
Albumin: 4.2 g/dL (ref 3.5–5.2)
BUN: 38 mg/dL — ABNORMAL HIGH (ref 6–23)
CO2: 23 mEq/L (ref 19–32)
Calcium: 9.3 mg/dL (ref 8.4–10.5)
Chloride: 106 mEq/L (ref 96–112)
Creatinine, Ser: 1.6 mg/dL — ABNORMAL HIGH (ref 0.4–1.2)
GFR: 34.22 mL/min — ABNORMAL LOW (ref >60.00)
Glucose, Bld: 106 mg/dL — ABNORMAL HIGH (ref 70–99)
Potassium: 4.5 mEq/L (ref 3.5–5.1)

## 2012-09-28 ENCOUNTER — Telehealth: Payer: Self-pay | Admitting: *Deleted

## 2012-09-28 NOTE — Telephone Encounter (Signed)
Faxed lab results to Dr  Thom Chimes [phone] [facility] [phone]).

## 2012-10-06 ENCOUNTER — Other Ambulatory Visit (INDEPENDENT_AMBULATORY_CARE_PROVIDER_SITE_OTHER): Payer: Medicare Other

## 2012-10-06 DIAGNOSIS — I2789 Other specified pulmonary heart diseases: Secondary | ICD-10-CM

## 2012-10-06 DIAGNOSIS — N289 Disorder of kidney and ureter, unspecified: Secondary | ICD-10-CM

## 2012-10-06 LAB — HEPATIC FUNCTION PANEL
AST: 37 U/L (ref 0–37)
Alkaline Phosphatase: 43 U/L (ref 39–117)
Bilirubin, Direct: 0 mg/dL (ref 0.0–0.3)

## 2012-10-06 LAB — CBC WITH DIFFERENTIAL/PLATELET
Basophils Absolute: 0 10*3/uL (ref 0.0–0.1)
Basophils Relative: 0.3 % (ref 0.0–3.0)
Eosinophils Absolute: 0 10*3/uL (ref 0.0–0.7)
Lymphocytes Relative: 22.4 % (ref 12.0–46.0)
MCHC: 33.6 g/dL (ref 30.0–36.0)
Monocytes Relative: 4.9 % (ref 3.0–12.0)
Neutrophils Relative %: 71.9 % (ref 43.0–77.0)
RBC: 3.12 Mil/uL — ABNORMAL LOW (ref 3.87–5.11)

## 2012-10-06 LAB — COMPREHENSIVE METABOLIC PANEL
BUN: 31 mg/dL — ABNORMAL HIGH (ref 6–23)
CO2: 23 mEq/L (ref 19–32)
Calcium: 9.7 mg/dL (ref 8.4–10.5)
Chloride: 105 mEq/L (ref 96–112)
Creatinine, Ser: 1.6 mg/dL — ABNORMAL HIGH (ref 0.4–1.2)
GFR: 33.97 mL/min — ABNORMAL LOW (ref >60.00)

## 2012-10-07 ENCOUNTER — Telehealth: Payer: Self-pay | Admitting: *Deleted

## 2012-10-07 NOTE — Telephone Encounter (Signed)
Faxed labs to Dr. Gaynell Face @ [phone].

## 2012-11-03 ENCOUNTER — Other Ambulatory Visit (INDEPENDENT_AMBULATORY_CARE_PROVIDER_SITE_OTHER): Payer: Medicare Other

## 2012-11-03 DIAGNOSIS — I271 Kyphoscoliotic heart disease: Secondary | ICD-10-CM

## 2012-11-03 DIAGNOSIS — N289 Disorder of kidney and ureter, unspecified: Secondary | ICD-10-CM

## 2012-11-03 DIAGNOSIS — I2789 Other specified pulmonary heart diseases: Secondary | ICD-10-CM

## 2012-11-03 LAB — HEPATIC FUNCTION PANEL
ALT: 27 U/L (ref 0–35)
Alkaline Phosphatase: 41 U/L (ref 39–117)
Bilirubin, Direct: 0 mg/dL (ref 0.0–0.3)
Total Protein: 7.1 g/dL (ref 6.0–8.3)

## 2012-11-03 LAB — CBC WITH DIFFERENTIAL/PLATELET
Basophils Absolute: 0 10*3/uL (ref 0.0–0.1)
Basophils Relative: 0.4 % (ref 0.0–3.0)
Eosinophils Relative: 0.5 % (ref 0.0–5.0)
Hemoglobin: 10.5 g/dL — ABNORMAL LOW (ref 12.0–15.0)
Lymphocytes Relative: 22.1 % (ref 12.0–46.0)
Monocytes Relative: 6.5 % (ref 3.0–12.0)
Neutro Abs: 4.3 10*3/uL (ref 1.4–7.7)
RBC: 3.13 Mil/uL — ABNORMAL LOW (ref 3.87–5.11)
WBC: 6.1 10*3/uL (ref 4.5–10.5)

## 2012-11-05 ENCOUNTER — Encounter: Payer: Self-pay | Admitting: Internal Medicine

## 2012-11-09 NOTE — Telephone Encounter (Signed)
Labs faxed to Dr Thom Chimes [phone] and mailed to [address] [place], [place] [address].

## 2012-12-04 ENCOUNTER — Other Ambulatory Visit: Payer: Self-pay

## 2012-12-07 ENCOUNTER — Other Ambulatory Visit (INDEPENDENT_AMBULATORY_CARE_PROVIDER_SITE_OTHER): Payer: Medicare Other

## 2012-12-07 DIAGNOSIS — I271 Kyphoscoliotic heart disease: Secondary | ICD-10-CM

## 2012-12-07 DIAGNOSIS — N289 Disorder of kidney and ureter, unspecified: Secondary | ICD-10-CM

## 2012-12-07 DIAGNOSIS — I2789 Other specified pulmonary heart diseases: Secondary | ICD-10-CM

## 2012-12-07 LAB — HEPATIC FUNCTION PANEL
ALT: 24 U/L (ref 0–35)
Alkaline Phosphatase: 41 U/L (ref 39–117)
Bilirubin, Direct: 0.1 mg/dL (ref 0.0–0.3)
Total Protein: 7.1 g/dL (ref 6.0–8.3)

## 2012-12-07 LAB — COMPREHENSIVE METABOLIC PANEL
AST: 35 U/L (ref 0–37)
Alkaline Phosphatase: 41 U/L (ref 39–117)
BUN: 27 mg/dL — ABNORMAL HIGH (ref 6–23)
Creatinine, Ser: 1.6 mg/dL — ABNORMAL HIGH (ref 0.4–1.2)
Potassium: 4.7 mEq/L (ref 3.5–5.1)

## 2012-12-07 LAB — CBC WITH DIFFERENTIAL/PLATELET
Basophils Absolute: 0 10*3/uL (ref 0.0–0.1)
Eosinophils Absolute: 0 10*3/uL (ref 0.0–0.7)
Lymphocytes Relative: 18.5 % (ref 12.0–46.0)
MCHC: 33.8 g/dL (ref 30.0–36.0)
Neutrophils Relative %: 75.3 % (ref 43.0–77.0)
RDW: 13.4 % (ref 11.5–14.6)

## 2013-01-03 ENCOUNTER — Ambulatory Visit (INDEPENDENT_AMBULATORY_CARE_PROVIDER_SITE_OTHER): Payer: Medicare Other

## 2013-01-03 DIAGNOSIS — N289 Disorder of kidney and ureter, unspecified: Secondary | ICD-10-CM

## 2013-01-03 DIAGNOSIS — I271 Kyphoscoliotic heart disease: Secondary | ICD-10-CM

## 2013-01-03 DIAGNOSIS — I2789 Other specified pulmonary heart diseases: Secondary | ICD-10-CM

## 2013-01-03 LAB — COMPREHENSIVE METABOLIC PANEL
ALT: 22 U/L (ref 0–35)
BUN: 36 mg/dL — ABNORMAL HIGH (ref 6–23)
CO2: 22 mEq/L (ref 19–32)
Creatinine, Ser: 1.5 mg/dL — ABNORMAL HIGH (ref 0.4–1.2)
GFR: 36.6 mL/min — ABNORMAL LOW (ref >60.00)
Glucose, Bld: 94 mg/dL (ref 70–99)
Total Bilirubin: 0.7 mg/dL (ref 0.3–1.2)

## 2013-01-03 LAB — HEPATIC FUNCTION PANEL
AST: 31 U/L (ref 0–37)
Albumin: 4.1 g/dL (ref 3.5–5.2)
Alkaline Phosphatase: 44 U/L (ref 39–117)
Bilirubin, Direct: 0.1 mg/dL (ref 0.0–0.3)

## 2013-01-03 LAB — CBC WITH DIFFERENTIAL/PLATELET
Basophils Absolute: 0 10*3/uL (ref 0.0–0.1)
Hemoglobin: 10.6 g/dL — ABNORMAL LOW (ref 12.0–15.0)
Lymphocytes Relative: 22.5 % (ref 12.0–46.0)
Monocytes Relative: 6.6 % (ref 3.0–12.0)
Neutro Abs: 4.5 10*3/uL (ref 1.4–7.7)
RBC: 3.15 Mil/uL — ABNORMAL LOW (ref 3.87–5.11)
RDW: 13.5 % (ref 11.5–14.6)

## 2013-01-04 NOTE — Progress Notes (Signed)
Labs, per Dr Linda Hedges, are to be faxed to Dr Gaynell Face at [facility]. He is no longer with the practice but labs were still faxed to [phone] or [phone] (phn [phone]) so one of his partners will have them in case pt is going to one of them.

## 2013-01-29 DIAGNOSIS — Z86711 Personal history of pulmonary embolism: Secondary | ICD-10-CM | POA: Insufficient documentation

## 2013-02-14 ENCOUNTER — Encounter: Payer: Self-pay | Admitting: Internal Medicine

## 2013-03-07 ENCOUNTER — Other Ambulatory Visit (INDEPENDENT_AMBULATORY_CARE_PROVIDER_SITE_OTHER): Payer: Medicare Other

## 2013-03-07 DIAGNOSIS — I2789 Other specified pulmonary heart diseases: Secondary | ICD-10-CM

## 2013-03-07 DIAGNOSIS — N289 Disorder of kidney and ureter, unspecified: Secondary | ICD-10-CM

## 2013-03-07 LAB — CBC WITH DIFFERENTIAL/PLATELET
Basophils Relative: 1.2 % (ref 0.0–3.0)
Eosinophils Relative: 1.4 % (ref 0.0–5.0)
HCT: 33.8 % — ABNORMAL LOW (ref 36.0–46.0)
Hemoglobin: 11.5 g/dL — ABNORMAL LOW (ref 12.0–15.0)
Lymphocytes Relative: 26.3 % (ref 12.0–46.0)
Lymphs Abs: 1.5 10*3/uL (ref 0.7–4.0)
Monocytes Relative: 6.3 % (ref 3.0–12.0)
Neutro Abs: 3.7 10*3/uL (ref 1.4–7.7)
RBC: 3.39 Mil/uL — ABNORMAL LOW (ref 3.87–5.11)
WBC: 5.7 10*3/uL (ref 4.5–10.5)

## 2013-03-07 LAB — HEPATIC FUNCTION PANEL
ALT: 21 U/L (ref 0–35)
AST: 33 U/L (ref 0–37)
Albumin: 4 g/dL (ref 3.5–5.2)
Alkaline Phosphatase: 35 U/L — ABNORMAL LOW (ref 39–117)

## 2013-03-07 LAB — COMPREHENSIVE METABOLIC PANEL
ALT: 21 U/L (ref 0–35)
Alkaline Phosphatase: 35 U/L — ABNORMAL LOW (ref 39–117)
Creatinine, Ser: 1.8 mg/dL — ABNORMAL HIGH (ref 0.4–1.2)
Sodium: 138 mEq/L (ref 135–145)
Total Bilirubin: 0.5 mg/dL (ref 0.3–1.2)
Total Protein: 7.4 g/dL (ref 6.0–8.3)

## 2013-04-06 ENCOUNTER — Other Ambulatory Visit (INDEPENDENT_AMBULATORY_CARE_PROVIDER_SITE_OTHER): Payer: Medicare Other

## 2013-04-06 DIAGNOSIS — I2789 Other specified pulmonary heart diseases: Secondary | ICD-10-CM

## 2013-04-06 DIAGNOSIS — N289 Disorder of kidney and ureter, unspecified: Secondary | ICD-10-CM

## 2013-04-06 LAB — CBC WITH DIFFERENTIAL/PLATELET
Basophils Absolute: 0 10*3/uL (ref 0.0–0.1)
Eosinophils Absolute: 0.1 10*3/uL (ref 0.0–0.7)
Hemoglobin: 10.7 g/dL — ABNORMAL LOW (ref 12.0–15.0)
Lymphocytes Relative: 24.7 % (ref 12.0–46.0)
Monocytes Relative: 5.4 % (ref 3.0–12.0)
Neutro Abs: 4.5 10*3/uL (ref 1.4–7.7)
Neutrophils Relative %: 67.8 % (ref 43.0–77.0)
RBC: 3.17 Mil/uL — ABNORMAL LOW (ref 3.87–5.11)
RDW: 13.5 % (ref 11.5–14.6)

## 2013-04-06 LAB — COMPREHENSIVE METABOLIC PANEL
BUN: 35 mg/dL — ABNORMAL HIGH (ref 6–23)
CO2: 22 mEq/L (ref 19–32)
Calcium: 9.1 mg/dL (ref 8.4–10.5)
Chloride: 102 mEq/L (ref 96–112)
Creatinine, Ser: 1.5 mg/dL — ABNORMAL HIGH (ref 0.4–1.2)
GFR: 36.02 mL/min — ABNORMAL LOW (ref >60.00)
Glucose, Bld: 95 mg/dL (ref 70–99)
Total Bilirubin: 0.5 mg/dL (ref 0.3–1.2)

## 2013-04-06 LAB — HEPATIC FUNCTION PANEL
AST: 30 U/L (ref 0–37)
Albumin: 4.1 g/dL (ref 3.5–5.2)
Alkaline Phosphatase: 39 U/L (ref 39–117)
Bilirubin, Direct: 0.1 mg/dL (ref 0.0–0.3)
Total Bilirubin: 0.5 mg/dL (ref 0.3–1.2)

## 2013-04-29 ENCOUNTER — Encounter: Payer: Self-pay | Admitting: Internal Medicine

## 2013-04-29 ENCOUNTER — Ambulatory Visit (INDEPENDENT_AMBULATORY_CARE_PROVIDER_SITE_OTHER): Payer: Medicare Other | Admitting: Internal Medicine

## 2013-04-29 VITALS — BP 100/58 | HR 64 | Ht 63.75 in | Wt 138.4 lb

## 2013-04-29 DIAGNOSIS — K219 Gastro-esophageal reflux disease without esophagitis: Secondary | ICD-10-CM

## 2013-04-29 DIAGNOSIS — R195 Other fecal abnormalities: Secondary | ICD-10-CM

## 2013-04-29 DIAGNOSIS — M341 CR(E)ST syndrome: Secondary | ICD-10-CM

## 2013-04-29 DIAGNOSIS — M349 Systemic sclerosis, unspecified: Secondary | ICD-10-CM

## 2013-04-29 NOTE — Patient Instructions (Addendum)
You have been scheduled for an endoscopy with propofol. Please follow written instructions given to you at your visit today. If you use inhalers (even only as needed), please bring them with you on the day of your procedure. Your physician has requested that you go to www.startemmi.com and enter the access code given to you at your visit today. This web site gives a general overview about your procedure. However, you should still follow specific instructions given to you by our office regarding your preparation for the procedure.  CC: Dr Adella Hare

## 2013-04-29 NOTE — Progress Notes (Signed)
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 Her last upper endoscopy was in June 2011. She has a history of APC ablation of the AVMs, blood transfusions and an iron dextran infusion in November 2013. She is on Prilosec 40 mg twice a day. She is up-to-date on her colonoscopy. Her last exam in July 2010 showed an AVM in the right colon. Her last hemoglobin 04/06/2013 was 10.7, hematocrit was 32.3 and MCV was 101. This is her usual hemoglobin range. She has chronic renal insufficiency with a creatinine of 1.5 and BUN of 35. Patient denies dysphagia or heartburn.   Past Medical History  Diagnosis Date  . Anemia   . Pulmonary embolus 2003  . CREST syndrome   . GERD (gastroesophageal reflux disease)   . Angiodysplasia of stomach and duodenum   . Arthus phenomenon   . Pulmonary hypertension   . Interstitial lung disease   . Scleroderma   . Rectal incontinence   . AVM (arteriovenous malformation)   . Nodule of kidney   . Uterine polyp   . PONV (postoperative nausea and vomiting)   . Hiatal hernia   . Chronic kidney disease     Chronic mild renal insuffiency  . Breast cancer 1989    Left  . Hypertension     Severe Pulmonary HTN  . Hypertension     seen at [facility] q 53mo Dr. TGaynell Face . Renal cell carcinoma    Past Surgical History  Procedure Laterality Date  . Tonsillectomy    . Appendectomy  1962  . Breast lumpectomy  1989    left  . Ivc filter    . Kidney surgery      left     reports that she has never smoked. She has never used smokeless tobacco. She reports that she does not drink alcohol or use illicit drugs. family history includes Alzheimer's disease in her mother; Cancer in her father and sister; Diabetes in her father and sister; Lung cancer in her sister; and Prostate  cancer in her father.  There is no history of Colon cancer. Allergies  Allergen Reactions  . Codeine Nausea Only    Hallucinations, too  . Other Nausea And Vomiting    "-mycin" antibiotics.   Also cause hallucinations.        Review of Systems: Negative for dysphagia heartburn abdominal pain except for occasional left upper quadrant discomfort  The remainder of the 10 point ROS is negative except as outlined in H&P   Physical Exam: General appearance  Well developed, in no distress. Eyes- non icteric. HEENT nontraumatic, normocephalic. Mouth no lesions, tongue papillated, no cheilosis multiple AVMs from the lips. Neck supple without adenopathy, thyroid not enlarged, no carotid bruits, no JVD. Lungs Clear to auscultation bilaterally. Cor normal S1, normal S2, regular rhythm, no murmur,  quiet precordium. Abdomen: Soft nontender with normoactive bowel sounds. Rectal: Soft trace Hemoccult-positive stool. Extremities no pedal edema. Skin multiple telangiectasias and AVMs throughout the face, upper torso and extremities. Neurological alert and oriented x 3. Psychological normal mood and affect.  Assessment and Plan:  Problem #16780year old white female with advanced CREST syndrome with ongoing low-grade GI blood loss due to AVMs. Her last upper endoscopy was in 2011. She is heme positive. We will schedule a repeat upper endoscopy to  rule out Barrett's esophagus. She is to continue Prilosec 40 mg twice a day and strict antireflux measures. She is up-to-date on her colonoscopy. Her hemoglobin is being checked monthly.   04/29/2013 Delfin Edis

## 2013-05-02 ENCOUNTER — Encounter: Payer: Self-pay | Admitting: Internal Medicine

## 2013-05-03 ENCOUNTER — Ambulatory Visit (INDEPENDENT_AMBULATORY_CARE_PROVIDER_SITE_OTHER): Payer: Medicare Other

## 2013-05-03 ENCOUNTER — Other Ambulatory Visit: Payer: Self-pay | Admitting: Internal Medicine

## 2013-05-03 DIAGNOSIS — N289 Disorder of kidney and ureter, unspecified: Secondary | ICD-10-CM

## 2013-05-03 DIAGNOSIS — I272 Pulmonary hypertension, unspecified: Secondary | ICD-10-CM

## 2013-05-03 DIAGNOSIS — I2789 Other specified pulmonary heart diseases: Secondary | ICD-10-CM

## 2013-05-03 LAB — CBC
Hemoglobin: 10.3 g/dL — ABNORMAL LOW (ref 12.0–15.0)
MCHC: 34 g/dL (ref 30.0–36.0)
Platelets: 185 10*3/uL (ref 150.0–400.0)
RDW: 13.4 % (ref 11.5–14.6)
WBC: 6 10*3/uL (ref 4.5–10.5)

## 2013-05-03 LAB — HEPATIC FUNCTION PANEL
Albumin: 4.1 g/dL (ref 3.5–5.2)
Alkaline Phosphatase: 37 U/L — ABNORMAL LOW (ref 39–117)
Total Protein: 7.3 g/dL (ref 6.0–8.3)

## 2013-05-03 LAB — COMPREHENSIVE METABOLIC PANEL
ALT: 18 U/L (ref 0–35)
AST: 24 U/L (ref 0–37)
CO2: 26 mEq/L (ref 19–32)
Calcium: 9.5 mg/dL (ref 8.4–10.5)
Chloride: 106 mEq/L (ref 96–112)
Creatinine, Ser: 1.6 mg/dL — ABNORMAL HIGH (ref 0.4–1.2)
GFR: 33.43 mL/min — ABNORMAL LOW (ref >60.00)
Potassium: 4.9 mEq/L (ref 3.5–5.1)
Sodium: 139 mEq/L (ref 135–145)
Total Protein: 7.3 g/dL (ref 6.0–8.3)

## 2013-05-04 NOTE — Progress Notes (Signed)
  Subjective:    Patient ID: Paula Ward, female    DOB: 1936/06/20, 77 y.o.   MRN: [id number]  HPI    Review of Systems     Objective:   Physical Exam        Assessment & Plan:

## 2013-05-04 NOTE — Progress Notes (Signed)
Labs have been faxed to [facility] at [phone]. There phone number is [phone].

## 2013-05-11 ENCOUNTER — Encounter: Payer: Self-pay | Admitting: Internal Medicine

## 2013-05-11 ENCOUNTER — Ambulatory Visit (AMBULATORY_SURGERY_CENTER): Payer: Medicare Other | Admitting: Internal Medicine

## 2013-05-11 VITALS — BP 127/56 | HR 74 | Temp 98.6°F | Resp 85 | Ht 63.75 in | Wt 138.0 lb

## 2013-05-11 DIAGNOSIS — K209 Esophagitis, unspecified without bleeding: Secondary | ICD-10-CM

## 2013-05-11 DIAGNOSIS — B3781 Candidal esophagitis: Secondary | ICD-10-CM

## 2013-05-11 DIAGNOSIS — K219 Gastro-esophageal reflux disease without esophagitis: Secondary | ICD-10-CM

## 2013-05-11 DIAGNOSIS — M341 CR(E)ST syndrome: Secondary | ICD-10-CM

## 2013-05-11 DIAGNOSIS — M349 Systemic sclerosis, unspecified: Secondary | ICD-10-CM

## 2013-05-11 MED ORDER — FLUCONAZOLE 100 MG PO TABS: 100.0000 mg | ORAL_TABLET | Freq: Every day | ORAL | Status: DC

## 2013-05-11 MED ORDER — SODIUM CHLORIDE 0.9 % IV SOLN: 500.0000 mL | INTRAVENOUS | Status: DC

## 2013-05-11 NOTE — Progress Notes (Signed)
Called to room to assist during endoscopic procedure.  Patient ID and intended procedure confirmed with present staff. Received instructions for my participation in the procedure from the performing physician.

## 2013-05-11 NOTE — Progress Notes (Signed)
Patient did not experience any of the following events: a burn prior to discharge; a fall within the facility; wrong site/side/patient/procedure/implant event; or a hospital transfer or hospital admission upon discharge from the facility. (G8907)Patient did not have preoperative order for IV antibiotic SSI prophylaxis. [phone])

## 2013-05-11 NOTE — Op Note (Signed)
[facility]  [address] [place], [id number]   ENDOSCOPY PROCEDURE REPORT  PATIENT: Ryka, Beighley  MR#: [id number] BIRTHDATE: 04-03-1936 , 76  yrs. old GENDER: Female ENDOSCOPIST: Lafayette Dragon, MD REFERRED BY: none PROCEDURE DATE:  05/11/2013 PROCEDURE:  EGD w/ biopsy ASA CLASS:     Class III INDICATIONS:  Dyspepsia.   Heartburn.   Iron deficiency anemia. Occult blood positive.  CREST syndrome- avm's chronic GIB, last EGD 03/2010 MEDICATIONS: MAC sedation, administered by CRNA and propofol (Diprivan) 13m IV TOPICAL ANESTHETIC: Cetacaine Spray  DESCRIPTION OF PROCEDURE: After the risks benefits and alternatives of the procedure were thoroughly explained, informed consent was obtained.  The LB [id number] was introduced through the mouth and advanced to the second portion of the duodenum. Without limitations.  The instrument was slowly withdrawn as the mucosa was fully examined.      esophagus: Proximal and mid esophagus was covered with the specks of whitish exudate consistent with Candida esophagitis biopsies were taken confirmation the Z line at 36 cm was irregular and there was an iIdahoof gastric mucosa proximal to the Z line which was biopsied to rule out Barrett's esophagus there was no stricture or significant hiatal hernia  Stomach the stomach was insufflated with air to show a normal rugal folds normal gastric antrum and normal-appearing mucosa gastric outlet was normal. Retroflexion of the endoscope did not reveal any evidence of AV malformations  Duodenum duodenal bulb and descending duodenum as far as third portion of duodenum did not show any evidence of AVMs[          The scope was then withdrawn from the patient and the procedure completed.  COMPLICATIONS: There were no complications. ENDOSCOPIC IMPRESSION: 1.Candida esophagitis,biopsies to rule out Candida esophagitis 2. Irregular Z line status post biopsies to  rule out Barrett's esophagus 3. normal appearing stomach no evidence of AV malformations 4. Normal descending duodenum to third portion RECOMMENDATIONS: 1.  Await biopsy results 2.  Anti-reflux regimen to be follow 3.  Continue PPI 4  .Diflucan 1076m #3 1 po qd x3  REPEAT EXAM: no recall  eSigned:  DoLafayette DragonMD 05/11/2013 11:25 AM   CC:  PATIENT NAME:  BrCaddie, RandleR#: [id number]

## 2013-05-11 NOTE — Patient Instructions (Addendum)
Discharge instructions given with verbal understanding. Biopsies taken. Resume previous medications. YOU HAD AN ENDOSCOPIC PROCEDURE TODAY AT [facility]: Refer to the procedure report that was given to you for any specific questions about what was found during the examination.  If the procedure report does not answer your questions, please call your gastroenterologist to clarify.  If you requested that your care partner not be given the details of your procedure findings, then the procedure report has been included in a sealed envelope for you to review at your convenience later.  YOU SHOULD EXPECT: Some feelings of bloating in the abdomen. Passage of more gas than usual.  Walking can help get rid of the air that was put into your GI tract during the procedure and reduce the bloating. If you had a lower endoscopy (such as a colonoscopy or flexible sigmoidoscopy) you may notice spotting of blood in your stool or on the toilet paper. If you underwent a bowel prep for your procedure, then you may not have a normal bowel movement for a few days.  DIET: Your first meal following the procedure should be a light meal and then it is ok to progress to your normal diet.  A half-sandwich or bowl of soup is an example of a good first meal.  Heavy or fried foods are harder to digest and may make you feel nauseous or bloated.  Likewise meals heavy in dairy and vegetables can cause extra gas to form and this can also increase the bloating.  Drink plenty of fluids but you should avoid alcoholic beverages for 24 hours.  ACTIVITY: Your care partner should take you home directly after the procedure.  You should plan to take it easy, moving slowly for the rest of the day.  You can resume normal activity the day after the procedure however you should NOT DRIVE or use heavy machinery for 24 hours (because of the sedation medicines used during the test).    SYMPTOMS TO REPORT IMMEDIATELY: A gastroenterologist  can be reached at any hour.  During normal business hours, 8:30 AM to 5:00 PM Monday through Friday, call [phone].  After hours and on weekends, please call the GI answering service at [phone] who will take a message and have the physician on call contact you.   Following upper endoscopy (EGD)  Vomiting of blood or coffee ground material  New chest pain or pain under the shoulder blades  Painful or persistently difficult swallowing  New shortness of breath  Fever of 100F or higher  Black, tarry-looking stools  FOLLOW UP: If any biopsies were taken you will be contacted by phone or by letter within the next 1-3 weeks.  Call your gastroenterologist if you have not heard about the biopsies in 3 weeks.  Our staff will call the home number listed on your records the next business day following your procedure to check on you and address any questions or concerns that you may have at that time regarding the information given to you following your procedure. This is a courtesy call and so if there is no answer at the home number and we have not heard from you through the emergency physician on call, we will assume that you have returned to your regular daily activities without incident.  SIGNATURES/CONFIDENTIALITY: You and/or your care partner have signed paperwork which will be entered into your electronic medical record.  These signatures attest to the fact that that the information above on your After  Visit Summary has been reviewed and is understood.  Full responsibility of the confidentiality of this discharge information lies with you and/or your care-partner.

## 2013-05-12 ENCOUNTER — Other Ambulatory Visit (HOSPITAL_COMMUNITY): Payer: Self-pay | Admitting: Interventional Radiology

## 2013-05-12 DIAGNOSIS — C642 Malignant neoplasm of left kidney, except renal pelvis: Secondary | ICD-10-CM

## 2013-05-13 ENCOUNTER — Telehealth: Payer: Self-pay

## 2013-05-13 NOTE — Telephone Encounter (Signed)
  Follow up Call-  Call back number 05/11/2013  Post procedure Call Back phone  # [phone]  Permission to leave phone message Yes     Patient questions:  Do you have a fever, pain , or abdominal swelling? no Pain Score  0 *  Have you tolerated food without any problems? yes  Have you been able to return to your normal activities? yes  Do you have any questions about your discharge instructions: Diet   no Medications  no Follow up visit  no  Do you have questions or concerns about your Care? no  Actions: * If pain score is 4 or above: No action needed, pain <4.

## 2013-05-16 ENCOUNTER — Encounter: Payer: Self-pay | Admitting: Internal Medicine

## 2013-05-17 ENCOUNTER — Other Ambulatory Visit (HOSPITAL_COMMUNITY): Payer: Self-pay | Admitting: Interventional Radiology

## 2013-05-17 DIAGNOSIS — C642 Malignant neoplasm of left kidney, except renal pelvis: Secondary | ICD-10-CM

## 2013-05-18 ENCOUNTER — Other Ambulatory Visit: Payer: Self-pay

## 2013-05-31 ENCOUNTER — Other Ambulatory Visit: Payer: Self-pay | Admitting: Internal Medicine

## 2013-05-31 ENCOUNTER — Other Ambulatory Visit (INDEPENDENT_AMBULATORY_CARE_PROVIDER_SITE_OTHER): Payer: Medicare Other

## 2013-05-31 DIAGNOSIS — I272 Pulmonary hypertension, unspecified: Secondary | ICD-10-CM

## 2013-05-31 DIAGNOSIS — I2789 Other specified pulmonary heart diseases: Secondary | ICD-10-CM

## 2013-05-31 DIAGNOSIS — N289 Disorder of kidney and ureter, unspecified: Secondary | ICD-10-CM

## 2013-05-31 LAB — CBC
RDW: 13.4 % (ref 11.5–14.6)
WBC: 5.3 10*3/uL (ref 4.5–10.5)

## 2013-05-31 LAB — COMPREHENSIVE METABOLIC PANEL
Albumin: 3.8 g/dL (ref 3.5–5.2)
CO2: 22 mEq/L (ref 19–32)
GFR: 36.28 mL/min — ABNORMAL LOW (ref >60.00)
Glucose, Bld: 84 mg/dL (ref 70–99)
Potassium: 4.6 mEq/L (ref 3.5–5.1)
Sodium: 138 mEq/L (ref 135–145)
Total Protein: 6.6 g/dL (ref 6.0–8.3)

## 2013-05-31 LAB — HEPATIC FUNCTION PANEL
AST: 30 U/L (ref 0–37)
Alkaline Phosphatase: 36 U/L — ABNORMAL LOW (ref 39–117)
Total Bilirubin: 0.6 mg/dL (ref 0.3–1.2)

## 2013-06-02 ENCOUNTER — Telehealth: Payer: Self-pay

## 2013-06-02 ENCOUNTER — Encounter: Payer: Self-pay | Admitting: Internal Medicine

## 2013-06-02 NOTE — Telephone Encounter (Signed)
Faxed labs from 05/31/13 to Dr Richrd Humbles at [phone]. This is the doctor who took Dr Karlene Einstein place per patient.

## 2013-06-16 ENCOUNTER — Ambulatory Visit (HOSPITAL_COMMUNITY)
Admission: RE | Admit: 2013-06-16 | Discharge: 2013-06-16 | Disposition: A | Discharge status: Home / Self Care | Payer: Medicare Other | Source: Ambulatory Visit | Attending: Interventional Radiology | Admitting: Interventional Radiology

## 2013-06-16 ENCOUNTER — Ambulatory Visit
Admission: RE | Admit: 2013-06-16 | Discharge: 2013-06-16 | Disposition: A | Discharge status: Home / Self Care | Payer: Medicare Other | Source: Ambulatory Visit | Attending: Interventional Radiology | Admitting: Interventional Radiology

## 2013-06-16 VITALS — BP 107/49 | HR 78 | Temp 97.6°F | Resp 15 | Ht 64.5 in | Wt 138.0 lb

## 2013-06-16 DIAGNOSIS — C642 Malignant neoplasm of left kidney, except renal pelvis: Secondary | ICD-10-CM

## 2013-06-16 DIAGNOSIS — C649 Malignant neoplasm of unspecified kidney, except renal pelvis: Secondary | ICD-10-CM | POA: Insufficient documentation

## 2013-06-16 DIAGNOSIS — I319 Disease of pericardium, unspecified: Secondary | ICD-10-CM | POA: Insufficient documentation

## 2013-06-16 DIAGNOSIS — N281 Cyst of kidney, acquired: Secondary | ICD-10-CM | POA: Insufficient documentation

## 2013-06-16 NOTE — Progress Notes (Signed)
Patient ID: Paula Ward, female   DOB: [birth year], 77 y.o.   MRN: [id number]  ESTABLISHED PATIENT OFFICE VISIT  Chief Complaint: Status post percutaneous cryoablation of a left renal carcinoma on 04/11/2011.  History: Mrs. Molina returns for follow-up. She has no complaints. She remains active and is exercising regularly with her husband.  Review of Systems: No fever or chills. No abdominal or flank pain. No hematuria or dysuria.  Exam: Vital signs: Blood pressure 107/49, pulse 78, respirations 14, temperature 97.6 and oxygen saturation 98% on room air. General: No acute distress. Abdomen: Soft and nontender. No flank tenderness.  Labs: BUN 26, creatinine 1.5 and estimated GFR 36 ml/minute on 05/31/2013.  Imaging: Follow-up MRI of the abdomen was performed today without contrast due to renal function. A stable cryoablation defect is noted at the level of the upper pole of the left kidney. No abnormal diffusion or enlarging mass is noted at the level of treatment to suggest tumor recurrence. No new renal lesions are identified.  Assessment and Plan: Stable left renal cryoablation defect without evidence of recurrent tumor. The patient is doing well post treatment. I have recommended another follow-up MRI in 1 year.

## 2013-06-16 NOTE — Progress Notes (Signed)
Denies hematuria or other problems with urination.  Denies pain related to procedure.  Paula Ward Riki Rusk, [place] 06/16/2013 10:15 AM

## 2013-06-22 ENCOUNTER — Encounter: Payer: Self-pay | Admitting: Internal Medicine

## 2013-06-22 ENCOUNTER — Ambulatory Visit (INDEPENDENT_AMBULATORY_CARE_PROVIDER_SITE_OTHER): Payer: Medicare Other | Admitting: Internal Medicine

## 2013-06-22 ENCOUNTER — Ambulatory Visit (INDEPENDENT_AMBULATORY_CARE_PROVIDER_SITE_OTHER)
Admission: RE | Admit: 2013-06-22 | Discharge: 2013-06-22 | Disposition: A | Discharge status: Home / Self Care | Payer: Medicare Other | Source: Ambulatory Visit | Attending: Internal Medicine | Admitting: Internal Medicine

## 2013-06-22 VITALS — BP 110/50 | HR 98 | Temp 99.3°F | Wt 134.4 lb

## 2013-06-22 DIAGNOSIS — J069 Acute upper respiratory infection, unspecified: Secondary | ICD-10-CM

## 2013-06-22 DIAGNOSIS — R059 Cough, unspecified: Secondary | ICD-10-CM

## 2013-06-22 DIAGNOSIS — R05 Cough: Secondary | ICD-10-CM

## 2013-06-22 MED ORDER — PROMETHAZINE-CODEINE 6.25-10 MG/5ML PO SYRP: 5.0000 mL | ORAL_SOLUTION | ORAL | Status: DC | PRN

## 2013-06-22 MED ORDER — BENZONATATE 100 MG PO CAPS: 100.0000 mg | ORAL_CAPSULE | Freq: Three times a day (TID) | ORAL | Status: DC | PRN

## 2013-06-22 NOTE — Progress Notes (Signed)
Subjective:    Patient ID: Paula Ward, female    DOB: 1936/07/21, 77 y.o.   MRN: [id number]  HPI Paula Ward presents with a 5 day h/o cough with clear sputum production, rhinorrhea - clear. She has been feeling week. No N/V/D. She has a lot of pressure in her ears. She has use the Nettie pot, taking delsyum for cough.   Past Medical History  Diagnosis Date  . Anemia   . Pulmonary embolus 2003  . CREST syndrome   . GERD (gastroesophageal reflux disease)   . Angiodysplasia of stomach and duodenum   . Arthus phenomenon   . Pulmonary hypertension   . Interstitial lung disease   . Scleroderma   . Rectal incontinence   . AVM (arteriovenous malformation)   . Nodule of kidney   . Uterine polyp   . PONV (postoperative nausea and vomiting)   . Hiatal hernia   . Chronic kidney disease     Chronic mild renal insuffiency  . Breast cancer 1989    Left  . Hypertension     Severe Pulmonary HTN  . Hypertension     seen at [facility] q 54mo Dr. TGaynell Face . Renal cell carcinoma    Past Surgical History  Procedure Laterality Date  . Tonsillectomy    . Appendectomy  1962  . Breast lumpectomy  1989    left  . Ivc filter    . Kidney surgery      left    Family History  Problem Relation Age of Onset  . Cancer Father     bladder  . Diabetes Father   . Prostate cancer Father   . Alzheimer's disease Mother   . Colon cancer Neg Hx   . Lung cancer Sister   . Diabetes Sister   . Cancer Sister     lung cancer- smoker   History   Social History  . Marital Status: Married    Spouse Name: N/A    Number of Children: N/A  . Years of Education: N/A   Occupational History  . Retired    Social History Main Topics  . Smoking status: Never Smoker   . Smokeless tobacco: Never Used  . Alcohol Use: No  . Drug Use: No  . Sexual Activity: No   Other Topics Concern  . Not on file   Social History Narrative   Married '61   1 son- '65, 1 daughter '63; 6 children (2 adopted)   SO- SOB   Retirement- doing well   Marriage in good health   Patient has never smoked   Alcohol use- no   Pt gets regular exercise          Current Outpatient Prescriptions on File Prior to Visit  Medication Sig Dispense Refill  . ALPRAZolam (XANAX) 0.25 MG tablet Take 0.125 mg by mouth at bedtime as needed.       . calcium citrate-vitamin D 200-200 MG-UNIT TABS Take 1 tablet by mouth daily.      . cholecalciferol (VITAMIN D) 1000 UNITS tablet Take 1,000 Units by mouth daily.      .Marland Kitchendiltiazem (CARDIZEM CD) 120 MG 24 hr capsule Take 120 mg by mouth daily.        .Marland Kitchenloratadine (CLARITIN) 10 MG tablet Take 10 mg by mouth daily.      .Marland Kitchenomeprazole (PRILOSEC) 40 MG capsule Take 1 capsule (40 mg total) by mouth 2 (two) times daily.  60 capsule  10  .  spironolactone (ALDACTONE) 50 MG tablet Take 50 mg by mouth daily.         No current facility-administered medications on file prior to visit.      Review of Systems System review is negative for any constitutional, cardiac, pulmonary, GI or neuro symptoms or complaints other than as described in the HPI.     Objective:   Physical Exam Filed Vitals:   06/22/13 1134  BP: 110/50  Pulse: 98  Temp: 99.3 F (37.4 C)   Wt Readings from Last 3 Encounters:  06/22/13 134 lb 6.4 oz (60.963 kg)  06/16/13 138 lb (62.596 kg)  05/11/13 138 lb (62.596 kg)   Gen'l- WNWD slender woman in no distress HEENT- C&S clear Cor 2+ radial, RRR Pulm - chronic bibasilar crackles. Increased rales at left base, at posterior axillary line. Neuro - Stable.        Assessment & Plan:  Viral upper respiratory infection: no high fever, enlarged lymph nodes or purulent drainage to suggest a bacterial infection. There are increased rales at the left base of the lung towards the side which may be fluid  Plan Chest X-ray to rule out pleural effusion or infiltrate  Symptomatic  Care - phenergan/codeine cough syrup at bedtime; delsym or robitussin during the day; tessalon  perle for tickle cough three times a day; behind the counter sudafed 30 mg two or three times a day for ear pressure;  hydrate, vitamin C 1500 mg daily, ecchinacea - any form you like.  Call for increased fever, pain in the sinus area, purulent drainage, increased shortness of breath.  Addendum: CHEST 2 VIEW  COMPARISON: 08/11/2012  FINDINGS:  There is hyperinflation of the lungs compatible with COPD.  Cardiomegaly. Chronic areas of scarring in the lungs bilaterally.  Chronic interstitial prominence. No effusions. No acute bony  abnormality.  IMPRESSION:  COPD, cardiomegaly and chronic changes. No acute findings.

## 2013-06-22 NOTE — Patient Instructions (Addendum)
Viral upper respiratory infection: no high fever, enlarged lymph nodes or purulent drainage to suggest a bacterial infection. There are increased rales at the left base of the lung towards the side which may be fluid  Plan Chest X-ray to rule out pleural effusion or infiltrate  Symptomatic  Care - phenergan/codeine cough syrup at bedtime; delsym or robitussin during the day; tessalon perle for tickle cough three times a day; behind the counter sudafed 30 mg two or three times a day for ear pressure;  hydrate, vitamin C 1500 mg daily, ecchinacea - any form you like.  Call for increased fever, pain in the sinus area, purulent drainage, increased shortness of breath.  Upper Respiratory Infection, Adult An upper respiratory infection (URI) is also sometimes known as the common cold. The upper respiratory tract includes the nose, sinuses, throat, trachea, and bronchi. Bronchi are the airways leading to the lungs. Most people improve within 1 week, but symptoms can last up to 2 weeks. A residual cough may last even longer.  CAUSES Many different viruses can infect the tissues lining the upper respiratory tract. The tissues become irritated and inflamed and often become very moist. Mucus production is also common. A cold is contagious. You can easily spread the virus to others by oral contact. This includes kissing, sharing a glass, coughing, or sneezing. Touching your mouth or nose and then touching a surface, which is then touched by another person, can also spread the virus. SYMPTOMS  Symptoms typically develop 1 to 3 days after you come in contact with a cold virus. Symptoms vary from person to person. They may include:  Runny nose.  Sneezing.  Nasal congestion.  Sinus irritation.  Sore throat.  Loss of voice (laryngitis).  Cough.  Fatigue.  Muscle aches.  Loss of appetite.  Headache.  Low-grade fever. DIAGNOSIS  You might diagnose your own cold based on familiar symptoms, since  most people get a cold 2 to 3 times a year. Your caregiver can confirm this based on your exam. Most importantly, your caregiver can check that your symptoms are not due to another disease such as strep throat, sinusitis, pneumonia, asthma, or epiglottitis. Blood tests, throat tests, and X-rays are not necessary to diagnose a common cold, but they may sometimes be helpful in excluding other more serious diseases. Your caregiver will decide if any further tests are required. RISKS AND COMPLICATIONS  You may be at risk for a more severe case of the common cold if you smoke cigarettes, have chronic heart disease (such as heart failure) or lung disease (such as asthma), or if you have a weakened immune system. The very young and very old are also at risk for more serious infections. Bacterial sinusitis, middle ear infections, and bacterial pneumonia can complicate the common cold. The common cold can worsen asthma and chronic obstructive pulmonary disease (COPD). Sometimes, these complications can require emergency medical care and may be life-threatening. PREVENTION  The best way to protect against getting a cold is to practice good hygiene. Avoid oral or hand contact with people with cold symptoms. Wash your hands often if contact occurs. There is no clear evidence that vitamin C, vitamin E, echinacea, or exercise reduces the chance of developing a cold. However, it is always recommended to get plenty of rest and practice good nutrition. TREATMENT  Treatment is directed at relieving symptoms. There is no cure. Antibiotics are not effective, because the infection is caused by a virus, not by bacteria. Treatment may include:  Increased fluid intake. Sports drinks offer valuable electrolytes, sugars, and fluids.  Breathing heated mist or steam (vaporizer or shower).  Eating chicken soup or other clear broths, and maintaining good nutrition.  Getting plenty of rest.  Using gargles or lozenges for  comfort.  Controlling fevers with ibuprofen or acetaminophen as directed by your caregiver.  Increasing usage of your inhaler if you have asthma. Zinc gel and zinc lozenges, taken in the first 24 hours of the common cold, can shorten the duration and lessen the severity of symptoms. Pain medicines may help with fever, muscle aches, and throat pain. A variety of non-prescription medicines are available to treat congestion and runny nose. Your caregiver can make recommendations and may suggest nasal or lung inhalers for other symptoms.  HOME CARE INSTRUCTIONS   Only take over-the-counter or prescription medicines for pain, discomfort, or fever as directed by your caregiver.  Use a warm mist humidifier or inhale steam from a shower to increase air moisture. This may keep secretions moist and make it easier to breathe.  Drink enough water and fluids to keep your urine clear or pale yellow.  Rest as needed.  Return to work when your temperature has returned to normal or as your caregiver advises. You may need to stay home longer to avoid infecting others. You can also use a face mask and careful hand washing to prevent spread of the virus. SEEK MEDICAL CARE IF:   After the first few days, you feel you are getting worse rather than better.  You need your caregiver's advice about medicines to control symptoms.  You develop chills, worsening shortness of breath, or [place] or red sputum. These may be signs of pneumonia.  You develop yellow or [place] nasal discharge or pain in the face, especially when you bend forward. These may be signs of sinusitis.  You develop a fever, swollen neck glands, pain with swallowing, or white areas in the back of your throat. These may be signs of strep throat. SEEK IMMEDIATE MEDICAL CARE IF:   You have a fever.  You develop severe or persistent headache, ear pain, sinus pain, or chest pain.  You develop wheezing, a prolonged cough, cough up blood, or have a  change in your usual mucus (if you have chronic lung disease).  You develop sore muscles or a stiff neck. Document Released: 04/01/2001 Document Revised: 12/29/2011 Document Reviewed: 02/07/2011 [facility] Patient Information 2014 [place], [place]. Upper Respiratory Infection, Adult An upper respiratory infection (URI) is also sometimes known as the common cold. The upper respiratory tract includes the nose, sinuses, throat, trachea, and bronchi. Bronchi are the airways leading to the lungs. Most people improve within 1 week, but symptoms can last up to 2 weeks. A residual cough may last even longer.  CAUSES Many different viruses can infect the tissues lining the upper respiratory tract. The tissues become irritated and inflamed and often become very moist. Mucus production is also common. A cold is contagious. You can easily spread the virus to others by oral contact. This includes kissing, sharing a glass, coughing, or sneezing. Touching your mouth or nose and then touching a surface, which is then touched by another person, can also spread the virus. SYMPTOMS  Symptoms typically develop 1 to 3 days after you come in contact with a cold virus. Symptoms vary from person to person. They may include:  Runny nose.  Sneezing.  Nasal congestion.  Sinus irritation.  Sore throat.  Loss of voice (laryngitis).  Cough.  Fatigue.  Muscle aches.  Loss of appetite.  Headache.  Low-grade fever. DIAGNOSIS  You might diagnose your own cold based on familiar symptoms, since most people get a cold 2 to 3 times a year. Your caregiver can confirm this based on your exam. Most importantly, your caregiver can check that your symptoms are not due to another disease such as strep throat, sinusitis, pneumonia, asthma, or epiglottitis. Blood tests, throat tests, and X-rays are not necessary to diagnose a common cold, but they may sometimes be helpful in excluding other more serious diseases. Your caregiver  will decide if any further tests are required. RISKS AND COMPLICATIONS  You may be at risk for a more severe case of the common cold if you smoke cigarettes, have chronic heart disease (such as heart failure) or lung disease (such as asthma), or if you have a weakened immune system. The very young and very old are also at risk for more serious infections. Bacterial sinusitis, middle ear infections, and bacterial pneumonia can complicate the common cold. The common cold can worsen asthma and chronic obstructive pulmonary disease (COPD). Sometimes, these complications can require emergency medical care and may be life-threatening. PREVENTION  The best way to protect against getting a cold is to practice good hygiene. Avoid oral or hand contact with people with cold symptoms. Wash your hands often if contact occurs. There is no clear evidence that vitamin C, vitamin E, echinacea, or exercise reduces the chance of developing a cold. However, it is always recommended to get plenty of rest and practice good nutrition. TREATMENT  Treatment is directed at relieving symptoms. There is no cure. Antibiotics are not effective, because the infection is caused by a virus, not by bacteria. Treatment may include:  Increased fluid intake. Sports drinks offer valuable electrolytes, sugars, and fluids.  Breathing heated mist or steam (vaporizer or shower).  Eating chicken soup or other clear broths, and maintaining good nutrition.  Getting plenty of rest.  Using gargles or lozenges for comfort.  Controlling fevers with ibuprofen or acetaminophen as directed by your caregiver.  Increasing usage of your inhaler if you have asthma. Zinc gel and zinc lozenges, taken in the first 24 hours of the common cold, can shorten the duration and lessen the severity of symptoms. Pain medicines may help with fever, muscle aches, and throat pain. A variety of non-prescription medicines are available to treat congestion and runny  nose. Your caregiver can make recommendations and may suggest nasal or lung inhalers for other symptoms.  HOME CARE INSTRUCTIONS   Only take over-the-counter or prescription medicines for pain, discomfort, or fever as directed by your caregiver.  Use a warm mist humidifier or inhale steam from a shower to increase air moisture. This may keep secretions moist and make it easier to breathe.  Drink enough water and fluids to keep your urine clear or pale yellow.  Rest as needed.  Return to work when your temperature has returned to normal or as your caregiver advises. You may need to stay home longer to avoid infecting others. You can also use a face mask and careful hand washing to prevent spread of the virus. SEEK MEDICAL CARE IF:   After the first few days, you feel you are getting worse rather than better.  You need your caregiver's advice about medicines to control symptoms.  You develop chills, worsening shortness of breath, or [place] or red sputum. These may be signs of pneumonia.  You develop yellow or [place] nasal discharge  or pain in the face, especially when you bend forward. These may be signs of sinusitis.  You develop a fever, swollen neck glands, pain with swallowing, or white areas in the back of your throat. These may be signs of strep throat. SEEK IMMEDIATE MEDICAL CARE IF:   You have a fever.  You develop severe or persistent headache, ear pain, sinus pain, or chest pain.  You develop wheezing, a prolonged cough, cough up blood, or have a change in your usual mucus (if you have chronic lung disease).  You develop sore muscles or a stiff neck. Document Released: 04/01/2001 Document Revised: 12/29/2011 Document Reviewed: 02/07/2011 [facility] Patient Information 2014 [place], [place].

## 2013-06-24 ENCOUNTER — Encounter: Payer: Self-pay | Admitting: Internal Medicine

## 2013-07-06 ENCOUNTER — Other Ambulatory Visit (INDEPENDENT_AMBULATORY_CARE_PROVIDER_SITE_OTHER): Payer: Medicare Other

## 2013-07-06 DIAGNOSIS — I272 Pulmonary hypertension, unspecified: Secondary | ICD-10-CM

## 2013-07-06 DIAGNOSIS — N289 Disorder of kidney and ureter, unspecified: Secondary | ICD-10-CM

## 2013-07-06 DIAGNOSIS — I2789 Other specified pulmonary heart diseases: Secondary | ICD-10-CM

## 2013-07-06 LAB — COMPREHENSIVE METABOLIC PANEL
ALT: 22 U/L (ref 0–35)
AST: 32 U/L (ref 0–37)
Calcium: 9.6 mg/dL (ref 8.4–10.5)
Chloride: 101 mEq/L (ref 96–112)
Creatinine, Ser: 1.6 mg/dL — ABNORMAL HIGH (ref 0.4–1.2)
Total Bilirubin: 0.5 mg/dL (ref 0.3–1.2)

## 2013-07-06 LAB — HEPATIC FUNCTION PANEL
Albumin: 4.1 g/dL (ref 3.5–5.2)
Total Protein: 7.3 g/dL (ref 6.0–8.3)

## 2013-07-06 LAB — CBC
HCT: 31.8 % — ABNORMAL LOW (ref 36.0–46.0)
Hemoglobin: 10.9 g/dL — ABNORMAL LOW (ref 12.0–15.0)
MCHC: 34.1 g/dL (ref 30.0–36.0)
MCV: 99.4 fl (ref 78.0–100.0)
Platelets: 229 10*3/uL (ref 150.0–400.0)

## 2013-07-23 ENCOUNTER — Encounter: Payer: Self-pay | Admitting: Internal Medicine

## 2013-07-29 ENCOUNTER — Other Ambulatory Visit: Payer: Self-pay | Admitting: *Deleted

## 2013-07-29 MED ORDER — OMEPRAZOLE 40 MG PO CPDR: 40.0000 mg | DELAYED_RELEASE_CAPSULE | Freq: Two times a day (BID) | ORAL | Status: DC

## 2013-08-02 ENCOUNTER — Other Ambulatory Visit (INDEPENDENT_AMBULATORY_CARE_PROVIDER_SITE_OTHER): Payer: Medicare Other

## 2013-08-02 DIAGNOSIS — N289 Disorder of kidney and ureter, unspecified: Secondary | ICD-10-CM

## 2013-08-02 DIAGNOSIS — I2789 Other specified pulmonary heart diseases: Secondary | ICD-10-CM

## 2013-08-02 DIAGNOSIS — I272 Pulmonary hypertension, unspecified: Secondary | ICD-10-CM

## 2013-08-02 LAB — COMPREHENSIVE METABOLIC PANEL
ALT: 26 U/L (ref 0–35)
AST: 39 U/L — ABNORMAL HIGH (ref 0–37)
Albumin: 4.1 g/dL (ref 3.5–5.2)
Calcium: 9.7 mg/dL (ref 8.4–10.5)
Chloride: 103 mEq/L (ref 96–112)
Potassium: 4.8 mEq/L (ref 3.5–5.1)
Sodium: 137 mEq/L (ref 135–145)

## 2013-08-02 LAB — CBC
HCT: 28.6 % — ABNORMAL LOW (ref 36.0–46.0)
MCHC: 33.9 g/dL (ref 30.0–36.0)
MCV: 99.9 fl (ref 78.0–100.0)

## 2013-08-02 LAB — HEPATIC FUNCTION PANEL
Bilirubin, Direct: 0.1 mg/dL (ref 0.0–0.3)
Total Bilirubin: 0.8 mg/dL (ref 0.3–1.2)
Total Protein: 7.1 g/dL (ref 6.0–8.3)

## 2013-09-01 ENCOUNTER — Telehealth: Payer: Self-pay | Admitting: Internal Medicine

## 2013-09-01 NOTE — Telephone Encounter (Signed)
Dr Olevia Perches, do you want me to fill this?

## 2013-09-01 NOTE — Telephone Encounter (Signed)
OK to refill Xanax

## 2013-09-02 MED ORDER — ALPRAZOLAM 0.25 MG PO TABS: 0.1250 mg | ORAL_TABLET | Freq: Every evening | ORAL | Status: DC | PRN

## 2013-09-02 NOTE — Telephone Encounter (Signed)
rx sent

## 2013-09-05 ENCOUNTER — Other Ambulatory Visit (INDEPENDENT_AMBULATORY_CARE_PROVIDER_SITE_OTHER): Payer: Medicare Other

## 2013-09-05 ENCOUNTER — Other Ambulatory Visit: Payer: Self-pay | Admitting: Internal Medicine

## 2013-09-05 DIAGNOSIS — N289 Disorder of kidney and ureter, unspecified: Secondary | ICD-10-CM

## 2013-09-05 DIAGNOSIS — I272 Pulmonary hypertension, unspecified: Secondary | ICD-10-CM

## 2013-09-05 DIAGNOSIS — I10 Essential (primary) hypertension: Secondary | ICD-10-CM

## 2013-09-05 DIAGNOSIS — I2789 Other specified pulmonary heart diseases: Secondary | ICD-10-CM

## 2013-09-05 LAB — COMPREHENSIVE METABOLIC PANEL
ALT: 26 U/L (ref 0–35)
Alkaline Phosphatase: 40 U/L (ref 39–117)
Sodium: 137 mEq/L (ref 135–145)
Total Bilirubin: 0.6 mg/dL (ref 0.3–1.2)
Total Protein: 7.2 g/dL (ref 6.0–8.3)

## 2013-09-05 LAB — CBC
MCV: 100.2 fl — ABNORMAL HIGH (ref 78.0–100.0)
Platelets: 178 10*3/uL (ref 150.0–400.0)
RBC: 3.07 Mil/uL — ABNORMAL LOW (ref 3.87–5.11)
WBC: 6.5 10*3/uL (ref 4.5–10.5)

## 2013-09-05 LAB — HEPATIC FUNCTION PANEL
ALT: 26 U/L (ref 0–35)
AST: 37 U/L (ref 0–37)
Bilirubin, Direct: 0 mg/dL (ref 0.0–0.3)
Total Protein: 7.2 g/dL (ref 6.0–8.3)

## 2013-10-04 ENCOUNTER — Other Ambulatory Visit (INDEPENDENT_AMBULATORY_CARE_PROVIDER_SITE_OTHER): Payer: Medicare Other

## 2013-10-04 DIAGNOSIS — I272 Pulmonary hypertension, unspecified: Secondary | ICD-10-CM

## 2013-10-04 DIAGNOSIS — N289 Disorder of kidney and ureter, unspecified: Secondary | ICD-10-CM

## 2013-10-04 DIAGNOSIS — I2789 Other specified pulmonary heart diseases: Secondary | ICD-10-CM

## 2013-10-04 LAB — COMPREHENSIVE METABOLIC PANEL
ALT: 26 U/L (ref 0–35)
AST: 40 U/L — ABNORMAL HIGH (ref 0–37)
CO2: 23 mEq/L (ref 19–32)
Calcium: 9.6 mg/dL (ref 8.4–10.5)
Chloride: 105 mEq/L (ref 96–112)
GFR: 34.63 mL/min — ABNORMAL LOW (ref >60.00)
Glucose, Bld: 115 mg/dL — ABNORMAL HIGH (ref 70–99)
Sodium: 136 mEq/L (ref 135–145)
Total Bilirubin: 0.5 mg/dL (ref 0.3–1.2)
Total Protein: 7.4 g/dL (ref 6.0–8.3)

## 2013-10-04 LAB — CBC
HCT: 31.8 % — ABNORMAL LOW (ref 36.0–46.0)
Hemoglobin: 10.7 g/dL — ABNORMAL LOW (ref 12.0–15.0)
MCHC: 33.6 g/dL (ref 30.0–36.0)
Platelets: 229 10*3/uL (ref 150.0–400.0)
RDW: 13.1 % (ref 11.5–14.6)
WBC: 8.4 10*3/uL (ref 4.5–10.5)

## 2013-10-04 LAB — HEPATIC FUNCTION PANEL
AST: 40 U/L — ABNORMAL HIGH (ref 0–37)
Albumin: 4.4 g/dL (ref 3.5–5.2)
Total Protein: 7.4 g/dL (ref 6.0–8.3)

## 2013-10-10 ENCOUNTER — Ambulatory Visit (INDEPENDENT_AMBULATORY_CARE_PROVIDER_SITE_OTHER): Payer: Medicare Other | Admitting: Internal Medicine

## 2013-10-10 ENCOUNTER — Ambulatory Visit (INDEPENDENT_AMBULATORY_CARE_PROVIDER_SITE_OTHER)
Admission: RE | Admit: 2013-10-10 | Discharge: 2013-10-10 | Disposition: A | Discharge status: Home / Self Care | Payer: Medicare Other | Source: Ambulatory Visit | Attending: Internal Medicine | Admitting: Internal Medicine

## 2013-10-10 ENCOUNTER — Encounter: Payer: Self-pay | Admitting: Internal Medicine

## 2013-10-10 VITALS — BP 122/58 | HR 112 | Temp 98.4°F | Wt 132.0 lb

## 2013-10-10 DIAGNOSIS — J069 Acute upper respiratory infection, unspecified: Secondary | ICD-10-CM

## 2013-10-10 DIAGNOSIS — R059 Cough, unspecified: Secondary | ICD-10-CM

## 2013-10-10 DIAGNOSIS — R05 Cough: Secondary | ICD-10-CM

## 2013-10-10 MED ORDER — AZITHROMYCIN 500 MG PO TABS: 500.0000 mg | ORAL_TABLET | Freq: Every day | ORAL | Status: DC

## 2013-10-10 NOTE — Progress Notes (Signed)
Subjective:    Patient ID: Paula Ward, female    DOB: 1936-03-12, 77 y.o.   MRN: [id number]  HPI Last Tuesday awoke with laryngitis which has progressed  to a cough that is productive of white sputum. No increased SOB, no fever. She has had some generalized weakness. She has taken phen/cod syrup, claritin, sudafed, vit C, ecchinacea. Delsym during day.  PMH, FamHx and SocHx reviewed for any changes and relevance.  Current Outpatient Prescriptions on File Prior to Visit  Medication Sig Dispense Refill  . ALPRAZolam (XANAX) 0.25 MG tablet Take 0.5 tablets (0.125 mg total) by mouth at bedtime as needed.  30 tablet  0  . benzonatate (TESSALON) 100 MG capsule Take 1 capsule (100 mg total) by mouth 3 (three) times daily as needed for cough.  30 capsule  1  . calcium citrate-vitamin D 200-200 MG-UNIT TABS Take 1 tablet by mouth daily.      . cholecalciferol (VITAMIN D) 1000 UNITS tablet Take 1,000 Units by mouth daily.      Marland Kitchen diltiazem (CARDIZEM CD) 120 MG 24 hr capsule Take 120 mg by mouth daily.        Marland Kitchen loratadine (CLARITIN) 10 MG tablet Take 10 mg by mouth daily.      Marland Kitchen omeprazole (PRILOSEC) 40 MG capsule Take 1 capsule (40 mg total) by mouth 2 (two) times daily.  60 capsule  5  . promethazine-codeine (PHENERGAN WITH CODEINE) 6.25-10 MG/5ML syrup Take 5 mLs by mouth every 4 (four) hours as needed for cough.  120 mL  0  . spironolactone (ALDACTONE) 50 MG tablet Take 50 mg by mouth daily.         No current facility-administered medications on file prior to visit.      Review of Systems System review is negative for any constitutional, cardiac, pulmonary, GI or neuro symptoms or complaints other than as described in the HPI.     Objective:   Physical Exam Filed Vitals:   10/10/13 1121  BP: 122/58  Pulse: 112  Temp: 98.4 F (36.9 C)   Gen;l - WNWD older woman in no distress HEENT - C&S clear, PERRLA Neck- supple Nodes - negative Cor- 2+ radial, RRR Pulm - no increased  WOB, chronic bibasilar rales, no wheezing. Neuro - A&O x 3, normal gait.  2 view CXR: IMPRESSION:  There are extensive chronic changes within the pulmonary parenchyma  but no acute pneumonia nor other acute abnormality is demonstrated.  There is stable enlargement of the cardiopericardial silhouette with  mild stable pulmonary vascular prominence.        Assessment & Plan:  URI - no evidence of pneumonia, most likely have a viral respiratory infection but cannot rule out an atypical infection and she is a more susceptible patient.  Plan Azithromycin 500 mg once a day for 3 days, provides 10 days of antibiotic coverage, especially atypical organisms  Chest x-ray - rule out any changes  Usual supportive care.

## 2013-10-10 NOTE — Patient Instructions (Signed)
Happy Holidays  You most likely have a viral respiratory infection but cannot rule out an atypical infection and you are a more susceptible patient.  Plan Azithromycin 500 mg once a day for 3 days, provides 10 days of antibiotic coverage, especially atypical organisms  Chest x-ray - rule out any changes  Usual supportive care.

## 2013-10-10 NOTE — Progress Notes (Signed)
Pre visit review using our clinic review tool, if applicable. No additional management support is needed unless otherwise documented below in the visit note.

## 2013-11-04 ENCOUNTER — Other Ambulatory Visit (INDEPENDENT_AMBULATORY_CARE_PROVIDER_SITE_OTHER): Payer: Medicare Other

## 2013-11-04 DIAGNOSIS — I2789 Other specified pulmonary heart diseases: Secondary | ICD-10-CM

## 2013-11-04 DIAGNOSIS — N289 Disorder of kidney and ureter, unspecified: Secondary | ICD-10-CM

## 2013-11-04 DIAGNOSIS — I272 Pulmonary hypertension, unspecified: Secondary | ICD-10-CM

## 2013-11-04 LAB — HEPATIC FUNCTION PANEL
ALT: 24 U/L (ref 0–35)
AST: 34 U/L (ref 0–37)
Albumin: 4.2 g/dL (ref 3.5–5.2)
Alkaline Phosphatase: 49 U/L (ref 39–117)
BILIRUBIN DIRECT: 0 mg/dL (ref 0.0–0.3)
BILIRUBIN TOTAL: 0.6 mg/dL (ref 0.3–1.2)
Total Protein: 7.3 g/dL (ref 6.0–8.3)

## 2013-11-04 LAB — CBC
HCT: 30.2 % — ABNORMAL LOW (ref 36.0–46.0)
Hemoglobin: 10.2 g/dL — ABNORMAL LOW (ref 12.0–15.0)
MCHC: 33.7 g/dL (ref 30.0–36.0)
MCV: 99.3 fl (ref 78.0–100.0)
Platelets: 196 10*3/uL (ref 150.0–400.0)
RBC: 3.04 Mil/uL — ABNORMAL LOW (ref 3.87–5.11)
RDW: 13.1 % (ref 11.5–14.6)
WBC: 6.4 10*3/uL (ref 4.5–10.5)

## 2013-11-04 LAB — COMPREHENSIVE METABOLIC PANEL
ALK PHOS: 49 U/L (ref 39–117)
ALT: 24 U/L (ref 0–35)
AST: 34 U/L (ref 0–37)
Albumin: 4.2 g/dL (ref 3.5–5.2)
BUN: 28 mg/dL — AB (ref 6–23)
CO2: 24 mEq/L (ref 19–32)
CREATININE: 1.5 mg/dL — AB (ref 0.4–1.2)
Calcium: 9.5 mg/dL (ref 8.4–10.5)
Chloride: 103 mEq/L (ref 96–112)
GFR: 35.69 mL/min — ABNORMAL LOW (ref >60.00)
GLUCOSE: 111 mg/dL — AB (ref 70–99)
Potassium: 4.9 mEq/L (ref 3.5–5.1)
Sodium: 135 mEq/L (ref 135–145)
Total Bilirubin: 0.6 mg/dL (ref 0.3–1.2)
Total Protein: 7.3 g/dL (ref 6.0–8.3)

## 2013-12-05 ENCOUNTER — Other Ambulatory Visit (INDEPENDENT_AMBULATORY_CARE_PROVIDER_SITE_OTHER): Payer: Medicare Other

## 2013-12-05 DIAGNOSIS — N289 Disorder of kidney and ureter, unspecified: Secondary | ICD-10-CM

## 2013-12-05 DIAGNOSIS — I2789 Other specified pulmonary heart diseases: Secondary | ICD-10-CM

## 2013-12-05 DIAGNOSIS — I272 Pulmonary hypertension, unspecified: Secondary | ICD-10-CM

## 2013-12-05 LAB — COMPREHENSIVE METABOLIC PANEL
ALBUMIN: 4.1 g/dL (ref 3.5–5.2)
ALT: 23 U/L (ref 0–35)
AST: 32 U/L (ref 0–37)
Alkaline Phosphatase: 41 U/L (ref 39–117)
BUN: 28 mg/dL — AB (ref 6–23)
CALCIUM: 9.6 mg/dL (ref 8.4–10.5)
CHLORIDE: 106 meq/L (ref 96–112)
CO2: 25 meq/L (ref 19–32)
Creatinine, Ser: 1.4 mg/dL — ABNORMAL HIGH (ref 0.4–1.2)
GFR: 37.69 mL/min — ABNORMAL LOW (ref >60.00)
Glucose, Bld: 90 mg/dL (ref 70–99)
Potassium: 4.6 mEq/L (ref 3.5–5.1)
Sodium: 137 mEq/L (ref 135–145)
Total Bilirubin: 0.6 mg/dL (ref 0.3–1.2)
Total Protein: 7.2 g/dL (ref 6.0–8.3)

## 2013-12-05 LAB — CBC
HEMATOCRIT: 32.4 % — AB (ref 36.0–46.0)
HEMOGLOBIN: 10.7 g/dL — AB (ref 12.0–15.0)
MCHC: 33.1 g/dL (ref 30.0–36.0)
MCV: 101.4 fl — ABNORMAL HIGH (ref 78.0–100.0)
PLATELETS: 205 10*3/uL (ref 150.0–400.0)
RBC: 3.19 Mil/uL — AB (ref 3.87–5.11)
RDW: 13.7 % (ref 11.5–14.6)
WBC: 6 10*3/uL (ref 4.5–10.5)

## 2013-12-05 LAB — HEPATIC FUNCTION PANEL
ALT: 23 U/L (ref 0–35)
AST: 32 U/L (ref 0–37)
Albumin: 4.1 g/dL (ref 3.5–5.2)
Alkaline Phosphatase: 41 U/L (ref 39–117)
Bilirubin, Direct: 0 mg/dL (ref 0.0–0.3)
Total Bilirubin: 0.6 mg/dL (ref 0.3–1.2)
Total Protein: 7.2 g/dL (ref 6.0–8.3)

## 2013-12-06 ENCOUNTER — Encounter: Payer: Self-pay | Admitting: Internal Medicine

## 2013-12-07 ENCOUNTER — Ambulatory Visit: Payer: Medicare Other

## 2013-12-18 HISTORY — PX: CATARACT EXTRACTION, BILATERAL: SHX1313

## 2014-01-05 ENCOUNTER — Other Ambulatory Visit (INDEPENDENT_AMBULATORY_CARE_PROVIDER_SITE_OTHER): Payer: Medicare Other

## 2014-01-05 DIAGNOSIS — I2789 Other specified pulmonary heart diseases: Secondary | ICD-10-CM

## 2014-01-05 DIAGNOSIS — I272 Pulmonary hypertension, unspecified: Secondary | ICD-10-CM

## 2014-01-05 DIAGNOSIS — N289 Disorder of kidney and ureter, unspecified: Secondary | ICD-10-CM

## 2014-01-05 LAB — HEPATIC FUNCTION PANEL
ALT: 22 U/L (ref 0–35)
AST: 31 U/L (ref 0–37)
Albumin: 4.2 g/dL (ref 3.5–5.2)
Alkaline Phosphatase: 39 U/L (ref 39–117)
BILIRUBIN DIRECT: 0 mg/dL (ref 0.0–0.3)
BILIRUBIN TOTAL: 0.6 mg/dL (ref 0.3–1.2)
Total Protein: 6.9 g/dL (ref 6.0–8.3)

## 2014-01-05 LAB — COMPREHENSIVE METABOLIC PANEL
ALT: 22 U/L (ref 0–35)
AST: 31 U/L (ref 0–37)
Albumin: 4.2 g/dL (ref 3.5–5.2)
Alkaline Phosphatase: 39 U/L (ref 39–117)
BUN: 31 mg/dL — AB (ref 6–23)
CO2: 23 mEq/L (ref 19–32)
Calcium: 9.3 mg/dL (ref 8.4–10.5)
Chloride: 101 mEq/L (ref 96–112)
Creatinine, Ser: 1.6 mg/dL — ABNORMAL HIGH (ref 0.4–1.2)
GFR: 34.35 mL/min — AB (ref >60.00)
GLUCOSE: 107 mg/dL — AB (ref 70–99)
POTASSIUM: 5.1 meq/L (ref 3.5–5.1)
Sodium: 133 mEq/L — ABNORMAL LOW (ref 135–145)
Total Bilirubin: 0.6 mg/dL (ref 0.3–1.2)
Total Protein: 6.9 g/dL (ref 6.0–8.3)

## 2014-01-05 LAB — CBC
HCT: 30.5 % — ABNORMAL LOW (ref 36.0–46.0)
Hemoglobin: 10.3 g/dL — ABNORMAL LOW (ref 12.0–15.0)
MCHC: 33.8 g/dL (ref 30.0–36.0)
MCV: 100.3 fl — ABNORMAL HIGH (ref 78.0–100.0)
Platelets: 210 10*3/uL (ref 150.0–400.0)
RBC: 3.04 Mil/uL — AB (ref 3.87–5.11)
RDW: 13.3 % (ref 11.5–14.6)
WBC: 6.6 10*3/uL (ref 4.5–10.5)

## 2014-01-27 ENCOUNTER — Ambulatory Visit: Payer: Medicare Other | Admitting: Internal Medicine

## 2014-01-30 ENCOUNTER — Ambulatory Visit (INDEPENDENT_AMBULATORY_CARE_PROVIDER_SITE_OTHER): Payer: Medicare Other | Admitting: Internal Medicine

## 2014-01-30 ENCOUNTER — Encounter: Payer: Self-pay | Admitting: Internal Medicine

## 2014-01-30 ENCOUNTER — Other Ambulatory Visit (INDEPENDENT_AMBULATORY_CARE_PROVIDER_SITE_OTHER): Payer: Medicare Other

## 2014-01-30 VITALS — BP 120/68 | HR 94 | Temp 98.7°F | Ht 64.0 in | Wt 134.0 lb

## 2014-01-30 DIAGNOSIS — K31819 Angiodysplasia of stomach and duodenum without bleeding: Secondary | ICD-10-CM

## 2014-01-30 DIAGNOSIS — R220 Localized swelling, mass and lump, head: Secondary | ICD-10-CM

## 2014-01-30 DIAGNOSIS — I2789 Other specified pulmonary heart diseases: Secondary | ICD-10-CM

## 2014-01-30 DIAGNOSIS — Z0000 Encounter for general adult medical examination without abnormal findings: Secondary | ICD-10-CM

## 2014-01-30 DIAGNOSIS — R221 Localized swelling, mass and lump, neck: Secondary | ICD-10-CM | POA: Insufficient documentation

## 2014-01-30 DIAGNOSIS — N289 Disorder of kidney and ureter, unspecified: Secondary | ICD-10-CM

## 2014-01-30 DIAGNOSIS — Z23 Encounter for immunization: Secondary | ICD-10-CM

## 2014-01-30 DIAGNOSIS — Z1211 Encounter for screening for malignant neoplasm of colon: Secondary | ICD-10-CM

## 2014-01-30 LAB — CBC WITH DIFFERENTIAL/PLATELET
BASOS ABS: 0 10*3/uL (ref 0.0–0.1)
Basophils Relative: 0.4 % (ref 0.0–3.0)
EOS PCT: 1.6 % (ref 0.0–5.0)
Eosinophils Absolute: 0.1 10*3/uL (ref 0.0–0.7)
HCT: 30.5 % — ABNORMAL LOW (ref 36.0–46.0)
Hemoglobin: 10.3 g/dL — ABNORMAL LOW (ref 12.0–15.0)
LYMPHS PCT: 25.3 % (ref 12.0–46.0)
Lymphs Abs: 1.5 10*3/uL (ref 0.7–4.0)
MCHC: 33.7 g/dL (ref 30.0–36.0)
MCV: 100 fl (ref 78.0–100.0)
MONOS PCT: 5.6 % (ref 3.0–12.0)
Monocytes Absolute: 0.3 10*3/uL (ref 0.1–1.0)
NEUTROS PCT: 67.1 % (ref 43.0–77.0)
Neutro Abs: 3.9 10*3/uL (ref 1.4–7.7)
PLATELETS: 188 10*3/uL (ref 150.0–400.0)
RBC: 3.05 Mil/uL — ABNORMAL LOW (ref 3.87–5.11)
RDW: 13.6 % (ref 11.5–14.6)
WBC: 5.8 10*3/uL (ref 4.5–10.5)

## 2014-01-30 LAB — BASIC METABOLIC PANEL
BUN: 23 mg/dL (ref 6–23)
CALCIUM: 9.4 mg/dL (ref 8.4–10.5)
CO2: 24 mEq/L (ref 19–32)
Chloride: 105 mEq/L (ref 96–112)
Creatinine, Ser: 1.4 mg/dL — ABNORMAL HIGH (ref 0.4–1.2)
GFR: 40.58 mL/min — AB (ref >60.00)
GLUCOSE: 87 mg/dL (ref 70–99)
POTASSIUM: 4.5 meq/L (ref 3.5–5.1)
SODIUM: 139 meq/L (ref 135–145)

## 2014-01-30 LAB — LIPID PANEL
Cholesterol: 180 mg/dL (ref 0–200)
HDL: 51.9 mg/dL (ref >39.00)
LDL CALC: 111 mg/dL — AB (ref 0–99)
Total CHOL/HDL Ratio: 3
Triglycerides: 87 mg/dL (ref 0.0–149.0)
VLDL: 17.4 mg/dL (ref 0.0–40.0)

## 2014-01-30 LAB — HEPATIC FUNCTION PANEL
ALK PHOS: 36 U/L — AB (ref 39–117)
ALT: 24 U/L (ref 0–35)
AST: 35 U/L (ref 0–37)
Albumin: 4.1 g/dL (ref 3.5–5.2)
BILIRUBIN DIRECT: 0.1 mg/dL (ref 0.0–0.3)
BILIRUBIN TOTAL: 0.5 mg/dL (ref 0.3–1.2)
Total Protein: 7.2 g/dL (ref 6.0–8.3)

## 2014-01-30 LAB — TSH: TSH: 3.83 u[IU]/mL (ref 0.35–5.50)

## 2014-01-30 MED ORDER — FLUTICASONE PROPIONATE 50 MCG/ACT NA SUSP: 2.0000 | Freq: Every day | NASAL | Status: DC

## 2014-01-30 NOTE — Assessment & Plan Note (Signed)
On Tracleer - Follows with [facility] - Dr Bartholomew Crews at this time Continue to monitor LFTs and CBC No changes recommended  No O2 needs or CHF symptoms at this time

## 2014-01-30 NOTE — Assessment & Plan Note (Signed)
Patient reports previously diagnosis lymph node ultrasound greater than 20 years ago 2.5cm diameter firm nodule Refer for ultrasound to re characterize same now

## 2014-01-30 NOTE — Patient Instructions (Signed)
It was good to see you today.  We have reviewed your prior records including labs and tests today  Health Maintenance reviewed - Prevanr pneumonia vaccine updated today -  all other recommended immunizations and age-appropriate screenings are up-to-date.  Test(s) ordered today. Your results will be released to [place] (or called to you) after review, usually within 72hours after test completion. If any changes need to be made, you will be notified at that same time.  Medications reviewed and updated, start Flonase for allergy symptoms -no other changes recommended at this time.  Your prescription(s) have been submitted to your pharmacy. Please take as directed and contact our office if you believe you are having problem(s) with the medication(s).  we'll make referral to gastroenterology for followup endoscopy/colonoscopy and for ultrasound to evaluate lymph node on the left side of your neck. Our office will contact you regarding appointment(s) once made.  Please schedule followup in 6 months, call sooner if problems.

## 2014-01-30 NOTE — Progress Notes (Signed)
Pre visit review using our clinic review tool, if applicable. No additional management support is needed unless otherwise documented below in the visit note.

## 2014-01-30 NOTE — Progress Notes (Signed)
Subjective:    Patient ID: Paula Ward, female    DOB: Jul 28, 1936, 78 y.o.   MRN: [id number]  HPI  Transfer to me from Dr. Daylene Posey due to retirement, here to establish with new PCP Here for medicare wellness  Diet: heart healthy  Physical activity: sedentary Depression/mood screen: negative Hearing: intact to whispered voice Visual acuity: grossly normal, performs annual eye exam  ADLs: capable Fall risk: none Home safety: good Cognitive evaluation: intact to orientation, naming, recall and repetition EOL planning: adv directives, full code/ I agree  I have personally reviewed and have noted 1. The patient's medical and social history 2. Their use of alcohol, tobacco or illicit drugs 3. Their current medications and supplements 4. The patient's functional ability including ADL's, fall risks, home safety risks and hearing or visual impairment. 5. Diet and physical activities 6. Evidence for depression or mood disorders  Also reviewed chronic medical issues and interval medical events  Past Medical History  Diagnosis Date  . Anemia   . Pulmonary embolus 2003  . CREST syndrome   . GERD (gastroesophageal reflux disease)     w/ HH  . Angiodysplasia of stomach and duodenum   . Arthus phenomenon   . Pulmonary hypertension     follow[facility] by Dr Gaynell [facility] at [facility]  . Interstitial lung disease   . Scleroderma   . Rectal incontinence   . AVM (arteriovenous malformation)   . Nodule of kidney   . Uterine polyp   . Chronic kidney disease     Chronic mild renal insuffiency  . Breast cancer 1989    Left  . Renal cell carcinoma    Family History  Problem Relation Age of Onset  . Cancer Father     bladder  . Diabetes Father   . Prostate cancer Father   . Alzheimer's disease Mother   . Colon cancer Neg Hx   . Lung cancer Sister   . Diabetes Sister   . Cancer Sister     lung cancer- smoker   History  Substance Use Topics  . Smoking status: Never Smoker   . Smokeless  tobacco: Never Used  . Alcohol Use: No    Review of Systems  Constitutional: Negative for fatigue and unexpected weight change.  Respiratory: Negative for cough, shortness of breath and wheezing.   Cardiovascular: Negative for chest pain, palpitations and leg swelling.  Gastrointestinal: Negative for nausea, abdominal pain and diarrhea.  Neurological: Negative for dizziness, weakness, light-headedness and headaches.  Psychiatric/Behavioral: Negative for dysphoric mood. The patient is not nervous/anxious.   All other systems reviewed and are negative.      Objective:   Physical Exam  BP 120/68  Pulse 94  Temp(Src) 98.7 F (37.1 C) (Oral)  Ht _0  (1.626 m)  Wt 134 lb (60.782 kg)  BMI 22.99 kg/m2  SpO2 98% Wt Readings from Last 3 Encounters:  01/30/14 134 lb (60.782 kg)  10/10/13 132 lb (59.875 kg)  06/22/13 134 lb 6.4 oz (60.963 kg)   Constitutional: She appears well-developed and well-nourished. No distress.  HENT: Head: Normocephalic and atraumatic. Ears: B TMs ok, no erythema or effusion; Nose: Nose normal. Mouth/Throat: Oropharynx is clear and moist. No oropharyngeal exudate.  Eyes: Conjunctivae and EOM are normal. Pupils are equal, round, and reactive to light. No scleral icterus.  Neck: 2.5cm "mass" ?LN L neck below mandible - nontender - no change size per pt in 20+ yrs. Normal range of motion. Neck supple. No JVD present. No  thyromegaly present.  Cardiovascular: Normal rate, regular rhythm and normal heart sounds.  No murmur heard. No BLE edema. Pulmonary/Chest: Effort normal -bilateral crackles at bases R>L. No respiratory distress. She has no wheezes.  Abdominal: Soft. Bowel sounds are normal. She exhibits no distension. There is no tenderness. no masses Musculoskeletal: Normal range of motion, no joint effusions. No gross deformities Neurological: She is alert and oriented to person, place, and time. No cranial nerve deficit. Coordination, balance, strength, speech  and gait are normal.  Skin: Skin is warm and dry. No rash noted. No erythema.  Psychiatric: She has a normal mood and affect. Her behavior is normal. Judgment and thought content normal.    Lab Results  Component Value Date   WBC 6.6 01/05/2014   HGB 10.3* 01/05/2014   HCT 30.5* 01/05/2014   PLT 210.0 01/05/2014   GLUCOSE 107* 01/05/2014   CHOL 183 01/07/2008   TRIG 69 01/07/2008   HDL 44.6 01/07/2008   LDLCALC 125* 01/07/2008   ALT 22 01/05/2014   ALT 22 01/05/2014   AST 31 01/05/2014   AST 31 01/05/2014   NA 133* 01/05/2014   K 5.1 01/05/2014   CL 101 01/05/2014   CREATININE 1.6* 01/05/2014   BUN 31* 01/05/2014   CO2 23 01/05/2014   INR 1.11 04/03/2011    Dg Chest 2 View  10/10/2013   CLINICAL DATA:  Five-day history of cough, history of pulmonary hypertension and COPD.  EXAM: CHEST  2 VIEW  COMPARISON:  Chest x-ray of June 22, 2013.  FINDINGS: The lungs are hyperinflated. The interstitial markings are mildly increased but not significantly changed since the previous study. The cardiopericardial silhouette remains enlarged. The pulmonary vascularity is mildly prominent centrally though stable. The patient has undergone partial mastectomy on the left as well as axillary lymph node dissection. There is no pleural effusion or pneumothorax. The observed portions of the bony thorax exhibit no acute abnormalities.  IMPRESSION: There are extensive chronic changes within the pulmonary parenchyma but no acute pneumonia nor other acute abnormality is demonstrated. There is stable enlargement of the cardiopericardial silhouette with mild stable pulmonary vascular prominence.   Electronically Signed   By: David  Martinique   On: 10/10/2013 13:31       Assessment & Plan:   CPX/v70.0 - Today patient counseled on age appropriate routine health concerns for screening and prevention, each reviewed and up to date or declined. Immunizations reviewed and up to date or declined. Labs ordered and reviewed. Risk factors  for depression reviewed and negative. Hearing function and visual acuity are intact. ADLs screened and addressed as needed. Functional ability and level of safety reviewed and appropriate. Education, counseling and referrals performed based on assessed risks today. Patient provided with a copy of personalized plan for preventive services.   Problem List Items Addressed This Visit   ANGIODYSPLASIA OF STOMACH AND DUODENUM     Chronic GIB with subsequent iron def anemia Monitor CBC and follow up GI as planned -  Brodie, due for colo - will make refer now Monitor CBC, consider po iron if appropriate     Relevant Medications      bosentan (TRACLEER) 125 MG tablet   Other Relevant Orders      CBC with Differential      Hepatic function panel      Ambulatory referral to Gastroenterology   Mass of left side of neck     Patient reports previously diagnosis lymph node ultrasound greater than 20 years  ago 2.5cm diameter firm nodule Refer for ultrasound to re characterize same now    Relevant Orders      US Soft Tissue Head/Neck   PULMONARY HYPERTENSION, SEVERE     On Tracleer - Follows with [facility] - Dr Bartholomew Crews at this time Continue to monitor LFTs and CBC No changes recommended  No O2 needs or CHF symptoms at this time    Relevant Medications      bosentan (TRACLEER) 125 MG tablet   Other Relevant Orders      Hepatic function panel      TSH      Lipid panel   Renal insufficiency     Follows with renal ([place]) annually Reports renal fx stable monitor    Relevant Orders      CBC with Differential      Lipid panel      Basic metabolic panel    Other Visit Diagnoses   Routine general medical examination at a health care facility    -  Primary    Special screening for malignant neoplasms, colon        Relevant Orders       Ambulatory referral to Gastroenterology

## 2014-01-30 NOTE — Assessment & Plan Note (Signed)
Follows with renal ([place]) annually Reports renal fx stable monitor

## 2014-01-30 NOTE — Assessment & Plan Note (Signed)
Chronic GIB with subsequent iron def anemia Monitor CBC and follow up GI as planned -  Brodie, due for colo - will make refer now Monitor CBC, consider po iron if appropriate

## 2014-01-31 ENCOUNTER — Other Ambulatory Visit: Payer: Self-pay | Admitting: *Deleted

## 2014-01-31 MED ORDER — OMEPRAZOLE 40 MG PO CPDR: 40.0000 mg | DELAYED_RELEASE_CAPSULE | Freq: Two times a day (BID) | ORAL | Status: DC

## 2014-02-01 ENCOUNTER — Encounter: Payer: Self-pay | Admitting: Internal Medicine

## 2014-02-02 ENCOUNTER — Ambulatory Visit
Admission: RE | Admit: 2014-02-02 | Discharge: 2014-02-02 | Disposition: A | Discharge status: Home / Self Care | Payer: Medicare Other | Source: Ambulatory Visit | Attending: Internal Medicine | Admitting: Internal Medicine

## 2014-02-02 DIAGNOSIS — R221 Localized swelling, mass and lump, neck: Secondary | ICD-10-CM

## 2014-02-03 ENCOUNTER — Encounter: Payer: Self-pay | Admitting: *Deleted

## 2014-02-08 ENCOUNTER — Other Ambulatory Visit: Payer: Medicare Other

## 2014-02-08 ENCOUNTER — Encounter: Payer: Self-pay | Admitting: Internal Medicine

## 2014-03-01 ENCOUNTER — Encounter: Payer: Self-pay | Admitting: Internal Medicine

## 2014-03-07 ENCOUNTER — Other Ambulatory Visit (INDEPENDENT_AMBULATORY_CARE_PROVIDER_SITE_OTHER): Payer: Medicare Other

## 2014-03-07 DIAGNOSIS — N289 Disorder of kidney and ureter, unspecified: Secondary | ICD-10-CM

## 2014-03-07 DIAGNOSIS — I2789 Other specified pulmonary heart diseases: Secondary | ICD-10-CM

## 2014-03-07 DIAGNOSIS — I272 Pulmonary hypertension, unspecified: Secondary | ICD-10-CM

## 2014-03-07 LAB — COMPREHENSIVE METABOLIC PANEL
ALT: 16 U/L (ref 0–35)
AST: 28 U/L (ref 0–37)
Albumin: 4.2 g/dL (ref 3.5–5.2)
Alkaline Phosphatase: 36 U/L — ABNORMAL LOW (ref 39–117)
BILIRUBIN TOTAL: 0.5 mg/dL (ref 0.2–1.2)
BUN: 39 mg/dL — ABNORMAL HIGH (ref 6–23)
CO2: 23 mEq/L (ref 19–32)
Calcium: 9.6 mg/dL (ref 8.4–10.5)
Chloride: 104 mEq/L (ref 96–112)
Creatinine, Ser: 1.9 mg/dL — ABNORMAL HIGH (ref 0.4–1.2)
GFR: 27.18 mL/min — ABNORMAL LOW (ref >60.00)
GLUCOSE: 90 mg/dL (ref 70–99)
Potassium: 4.9 mEq/L (ref 3.5–5.1)
SODIUM: 136 meq/L (ref 135–145)
TOTAL PROTEIN: 7.3 g/dL (ref 6.0–8.3)

## 2014-03-07 LAB — HEPATIC FUNCTION PANEL
ALT: 16 U/L (ref 0–35)
AST: 28 U/L (ref 0–37)
Albumin: 4.2 g/dL (ref 3.5–5.2)
Alkaline Phosphatase: 36 U/L — ABNORMAL LOW (ref 39–117)
BILIRUBIN TOTAL: 0.5 mg/dL (ref 0.2–1.2)
Bilirubin, Direct: 0.1 mg/dL (ref 0.0–0.3)
TOTAL PROTEIN: 7.3 g/dL (ref 6.0–8.3)

## 2014-03-07 LAB — CBC
HEMATOCRIT: 31.2 % — AB (ref 36.0–46.0)
Hemoglobin: 10.6 g/dL — ABNORMAL LOW (ref 12.0–15.0)
MCHC: 33.8 g/dL (ref 30.0–36.0)
MCV: 100.4 fl — ABNORMAL HIGH (ref 78.0–100.0)
PLATELETS: 217 10*3/uL (ref 150.0–400.0)
RBC: 3.11 Mil/uL — AB (ref 3.87–5.11)
RDW: 13.3 % (ref 11.5–15.5)
WBC: 7.1 10*3/uL (ref 4.0–10.5)

## 2014-03-20 ENCOUNTER — Encounter: Payer: Self-pay | Admitting: Internal Medicine

## 2014-03-20 ENCOUNTER — Ambulatory Visit (INDEPENDENT_AMBULATORY_CARE_PROVIDER_SITE_OTHER): Payer: Medicare Other | Admitting: Internal Medicine

## 2014-03-20 VITALS — BP 110/40 | HR 87 | Temp 98.1°F | Wt 132.0 lb

## 2014-03-20 DIAGNOSIS — H6980 Other specified disorders of Eustachian tube, unspecified ear: Secondary | ICD-10-CM

## 2014-03-20 NOTE — Patient Instructions (Signed)
Go to [facility] for eustachian tube dysfunction. Drink thin  fluids liberally through the day and chew sugarless gum . Do the Valsalva maneuver several times a day to "pop" ears open. Flonase 1 spray in each nostril twice a day as needed. Use the "crossover" technique as discussed. Use a Neti pot daily- 2x /day  as needed for sinus congestion  

## 2014-03-20 NOTE — Progress Notes (Signed)
Pre visit review using our clinic review tool, if applicable. No additional management support is needed unless otherwise documented below in the visit note.

## 2014-03-20 NOTE — Progress Notes (Signed)
Subjective:    Patient ID: Paula Ward, female    DOB: August 29, 1936, 78 y.o.   MRN: [id number]  HPI   She presents with fullness in the right ear described as a pressure for  the last 3 weeks without specific trigger.  She states it as if she wants to "unplug it" .She also questions decreased auditory acuity  She has used peroxide in the ear "a dozen times" ; nasal gavage 10-15 times; and Flonase up to every other night without resolution of symptoms  She has the CREST form of scleroderma   Review of Systems  She denies any signs or symptoms of rhinosinusitis prior to the event. Specifically he denied frontal headache, facial pain, nasal purulence, or otic discharge.  There was no trigger such as airflight or exposure to baro pressure  She denies lightheadedness or dizziness.  She has no fever, chills, or sweats.       Objective:   Physical Exam   The right tympanic membrane is slightly dull. Tuning fork exam is normal. There is no evidence of TMJ. There's no tenderness to deep palpation over the mastoids.  General appearance:thin but in good health ;well nourished; no acute distress or increased work of breathing is present.  No  lymphadenopathy about the head, neck, or axilla noted.   Eyes: No conjunctival inflammation or lid edema is present. There is no scleral icterus.  Ears:  External ear exam shows no significant lesions or deformities.  Tympanic membranes are intact bilaterally without bulging, retraction, inflammation or discharge.  Nose:  External nasal examination shows no deformity or inflammation. Nasal mucosa are pink and moist without lesions or exudates. No septal dislocation or deviation.No obstruction to airflow.   Oral exam: Dental hygiene is good; lips and gums are healthy appearing.There is no oropharyngeal erythema or exudate noted.   Neck:  No deformities, thyromegaly, masses, or tenderness noted.  Heart:  Normal rate and regular rhythm. S1 and S2  normal without gallop, murmur, click, rub or other extra sounds.   Lungs:Dry bibasilar rales w/o rubs present.No increased work of breathing.    Extremities:  No cyanosis, edema, or clubbing noted.   Skin: Warm & dry w/o jaundice or tenting. Sclerodermatous changes are not present in the hands         Assessment & Plan:  #1 eustachian tube dysfunction  See after visit summary

## 2014-04-27 ENCOUNTER — Other Ambulatory Visit: Payer: Self-pay | Admitting: Radiology

## 2014-04-27 ENCOUNTER — Other Ambulatory Visit (HOSPITAL_COMMUNITY): Payer: Self-pay | Admitting: Interventional Radiology

## 2014-04-27 ENCOUNTER — Telehealth: Payer: Self-pay | Admitting: *Deleted

## 2014-04-27 DIAGNOSIS — C642 Malignant neoplasm of left kidney, except renal pelvis: Secondary | ICD-10-CM

## 2014-04-27 NOTE — Telephone Encounter (Signed)
Left msg on triage stating they have to do a f/u MRI on pt. She is coming in to see md on 05/18/14. Called Tammy back had to leave vm will send to md att...lmb

## 2014-04-28 NOTE — Telephone Encounter (Signed)
Noted - thanks for update

## 2014-05-09 ENCOUNTER — Other Ambulatory Visit: Payer: Self-pay | Admitting: *Deleted

## 2014-05-09 MED ORDER — OMEPRAZOLE 40 MG PO CPDR: 40.0000 mg | DELAYED_RELEASE_CAPSULE | Freq: Two times a day (BID) | ORAL | Status: DC

## 2014-05-18 ENCOUNTER — Ambulatory Visit (INDEPENDENT_AMBULATORY_CARE_PROVIDER_SITE_OTHER): Payer: Medicare Other | Admitting: Internal Medicine

## 2014-05-18 ENCOUNTER — Other Ambulatory Visit (INDEPENDENT_AMBULATORY_CARE_PROVIDER_SITE_OTHER): Payer: Medicare Other

## 2014-05-18 ENCOUNTER — Encounter: Payer: Self-pay | Admitting: Internal Medicine

## 2014-05-18 VITALS — BP 118/60 | HR 86 | Temp 98.2°F | Resp 16 | Ht 64.0 in | Wt 129.0 lb

## 2014-05-18 DIAGNOSIS — I2789 Other specified pulmonary heart diseases: Secondary | ICD-10-CM

## 2014-05-18 DIAGNOSIS — I10 Essential (primary) hypertension: Secondary | ICD-10-CM

## 2014-05-18 DIAGNOSIS — R5381 Other malaise: Secondary | ICD-10-CM

## 2014-05-18 DIAGNOSIS — R5383 Other fatigue: Secondary | ICD-10-CM

## 2014-05-18 DIAGNOSIS — C649 Malignant neoplasm of unspecified kidney, except renal pelvis: Secondary | ICD-10-CM

## 2014-05-18 DIAGNOSIS — C642 Malignant neoplasm of left kidney, except renal pelvis: Secondary | ICD-10-CM

## 2014-05-18 LAB — BASIC METABOLIC PANEL
BUN: 50 mg/dL — AB (ref 6–23)
CO2: 26 mEq/L (ref 19–32)
Calcium: 9.5 mg/dL (ref 8.4–10.5)
Chloride: 105 mEq/L (ref 96–112)
Creatinine, Ser: 2.3 mg/dL — ABNORMAL HIGH (ref 0.4–1.2)
GFR: 22.04 mL/min — AB (ref >60.00)
Glucose, Bld: 105 mg/dL — ABNORMAL HIGH (ref 70–99)
Potassium: 4.7 mEq/L (ref 3.5–5.1)
SODIUM: 139 meq/L (ref 135–145)

## 2014-05-18 LAB — CBC WITH DIFFERENTIAL/PLATELET
BASOS PCT: 1.2 % (ref 0.0–3.0)
Basophils Absolute: 0.1 10*3/uL (ref 0.0–0.1)
EOS PCT: 0.7 % (ref 0.0–5.0)
Eosinophils Absolute: 0.1 10*3/uL (ref 0.0–0.7)
HCT: 31.9 % — ABNORMAL LOW (ref 36.0–46.0)
HEMOGLOBIN: 10.5 g/dL — AB (ref 12.0–15.0)
Lymphocytes Relative: 22.6 % (ref 12.0–46.0)
Lymphs Abs: 1.8 10*3/uL (ref 0.7–4.0)
MCHC: 33 g/dL (ref 30.0–36.0)
MCV: 100.3 fl — ABNORMAL HIGH (ref 78.0–100.0)
Monocytes Absolute: 0.5 10*3/uL (ref 0.1–1.0)
Monocytes Relative: 5.9 % (ref 3.0–12.0)
Neutro Abs: 5.4 10*3/uL (ref 1.4–7.7)
Neutrophils Relative %: 69.6 % (ref 43.0–77.0)
PLATELETS: 228 10*3/uL (ref 150.0–400.0)
RBC: 3.18 Mil/uL — ABNORMAL LOW (ref 3.87–5.11)
RDW: 13.3 % (ref 11.5–15.5)
WBC: 7.8 10*3/uL (ref 4.0–10.5)

## 2014-05-18 LAB — HEPATIC FUNCTION PANEL
ALT: 16 U/L (ref 0–35)
AST: 25 U/L (ref 0–37)
Albumin: 4.4 g/dL (ref 3.5–5.2)
Alkaline Phosphatase: 39 U/L (ref 39–117)
BILIRUBIN DIRECT: 0.1 mg/dL (ref 0.0–0.3)
BILIRUBIN TOTAL: 0.5 mg/dL (ref 0.2–1.2)
Total Protein: 7.6 g/dL (ref 6.0–8.3)

## 2014-05-18 NOTE — Patient Instructions (Signed)
It was good to see you today.  We have reviewed your prior records including labs and tests today  Test(s) ordered today. Your results will be released to [facility] (or called to you) after review, usually within 72hours after test completion. If any changes need to be made, you will be notified at that same time.  Medications reviewed and updated, no changes recommended at this time.  Continue working with your specialists as ongoing  Please schedule followup in 4-6 months, call sooner if problems.

## 2014-05-18 NOTE — Assessment & Plan Note (Signed)
Due for annual followup with IR Check labs prior to scheduled imaging as requested

## 2014-05-18 NOTE — Progress Notes (Signed)
Pre visit review using our clinic review tool, if applicable. No additional management support is needed unless otherwise documented below in the visit note.

## 2014-05-18 NOTE — Assessment & Plan Note (Signed)
Follows with [facility] - Dr Bartholomew Crews at this time Late 01/2014, changed Tracleer to Opsumit Continue to monitor LFTs and CBC No changes recommended by me No O2 needs or CHF symptoms at this time

## 2014-05-18 NOTE — Assessment & Plan Note (Signed)
BP Readings from Last 3 Encounters:  05/18/14 118/60  03/20/14 110/40  01/30/14 120/68   The current medical regimen is effective;  continue present plan and medications.

## 2014-05-18 NOTE — Addendum Note (Signed)
Addended by: Gwendolyn Grant A on: 05/18/2014 06:11 PM   Modules accepted: Orders

## 2014-05-18 NOTE — Progress Notes (Signed)
Subjective:    Patient ID: Paula Ward, female    DOB: Sep 16, 1936, 78 y.o.   MRN: [id number]  HPI  Patient is here for follow up  Reviewed chronic medical issues and interval medical events  Past Medical History  Diagnosis Date  . Anemia   . Pulmonary embolus 2003  . CREST syndrome   . GERD (gastroesophageal reflux disease)     w/ HH  . Angiodysplasia of stomach and duodenum   . Arthus phenomenon   . Pulmonary hypertension     followed by Dr Gaynell Face at [facility]  . Interstitial lung disease   . Scleroderma   . Rectal incontinence   . AVM (arteriovenous malformation)   . Nodule of kidney   . Uterine polyp   . Chronic kidney disease     Chronic mild renal insuffiency  . Breast cancer 1989    Left  . Renal cell carcinoma     Review of Systems  Constitutional: Positive for fatigue. Negative for fever and unexpected weight change.  Respiratory: Positive for shortness of breath ("heavy" x 4 weeks). Negative for cough, wheezing and stridor.   Cardiovascular: Negative for chest pain and leg swelling.       Objective:   Physical Exam  BP 118/60  Pulse 86  Temp(Src) 98.2 F (36.8 C) (Oral)  Resp 16  Ht _0  (1.626 m)  Wt 129 lb (58.514 kg)  BMI 22.13 kg/m2  SpO2 96% Wt Readings from Last 3 Encounters:  05/18/14 129 lb (58.514 kg)  03/20/14 132 lb (59.875 kg)  01/30/14 134 lb (60.782 kg)    Constitutional: She appears well-developed and well-nourished. No distress.  Neck: Normal range of motion. Neck supple. No JVD present. No thyromegaly present.  Cardiovascular: Normal rate, regular rhythm and normal heart sounds.  No murmur heard. No BLE edema. Pulmonary/Chest: Effort normal and breath sounds with B crackles (baseline). No respiratory distress. She has no wheezes.  Psychiatric: She has a normal mood and affect. Her behavior is normal. Judgment and thought content normal.   Lab Results  Component Value Date   WBC 7.1 03/07/2014   HGB 10.6* 03/07/2014   HCT  31.2* 03/07/2014   PLT 217.0 03/07/2014   GLUCOSE 90 03/07/2014   CHOL 180 01/30/2014   TRIG 87.0 01/30/2014   HDL 51.90 01/30/2014   LDLCALC 111* 01/30/2014   ALT 16 03/07/2014   ALT 16 03/07/2014   AST 28 03/07/2014   AST 28 03/07/2014   NA 136 03/07/2014   K 4.9 03/07/2014   CL 104 03/07/2014   CREATININE 1.9* 03/07/2014   BUN 39* 03/07/2014   CO2 23 03/07/2014   TSH 3.83 01/30/2014   INR 1.11 04/03/2011    US Soft Tissue Head/neck  02/02/2014   CLINICAL DATA:  Left neck mass, palpable abnormality  EXAM: ULTRASOUND OF HEAD/NECK SOFT TISSUES  TECHNIQUE: Ultrasound examination of the head and neck soft tissues was performed in the area of clinical concern.  COMPARISON:  None  FINDINGS: At the site of clinical concern, a 1.6 x 0.6 x 1.4 cm diameter soft tissue nodule within echogenic core is identified.  Morphology is typical of a cervical lymph node.  No additional mass or abnormal fluid collection identified.  Incidentally noted atherosclerotic calcifications at the left common carotid artery.  IMPRESSION: Palpable finding corresponds to a 1.6 x 1.4 x 0.6 cm diameter left cervical lymph node, upper normal size by sonographic measurements.   Electronically Signed   By: Elta Guadeloupe  Thornton Papas M.D.   On: 02/02/2014 15:29       Assessment & Plan:   Fatigue - nonspecific symptoms/exam - check screening labs  Problem List Items Addressed This Visit   HYPERTENSION      BP Readings from Last 3 Encounters:  05/18/14 118/60  03/20/14 110/40  01/30/14 120/68   The current medical regimen is effective;  continue present plan and medications.     Relevant Orders      Basic metabolic panel   PULMONARY HYPERTENSION, SEVERE - Primary     Follows with [facility] - Dr Bartholomew Crews at this time Late 01/2014, changed Tracleer to Opsumit Continue to monitor LFTs and CBC No changes recommended by me No O2 needs or CHF symptoms at this time    Renal cell carcinoma     Due for annual followup with IR Check labs prior to  scheduled imaging as requested     Other Visit Diagnoses   Other malaise and fatigue        Relevant Orders       Basic metabolic panel       CBC with Differential       Hepatic function panel

## 2014-05-22 ENCOUNTER — Other Ambulatory Visit (INDEPENDENT_AMBULATORY_CARE_PROVIDER_SITE_OTHER): Payer: Medicare Other

## 2014-05-22 DIAGNOSIS — I2789 Other specified pulmonary heart diseases: Secondary | ICD-10-CM

## 2014-05-22 DIAGNOSIS — R5381 Other malaise: Secondary | ICD-10-CM

## 2014-05-22 DIAGNOSIS — R5383 Other fatigue: Secondary | ICD-10-CM

## 2014-05-22 DIAGNOSIS — C649 Malignant neoplasm of unspecified kidney, except renal pelvis: Secondary | ICD-10-CM

## 2014-05-22 DIAGNOSIS — C642 Malignant neoplasm of left kidney, except renal pelvis: Secondary | ICD-10-CM

## 2014-05-22 DIAGNOSIS — I10 Essential (primary) hypertension: Secondary | ICD-10-CM

## 2014-05-22 LAB — BASIC METABOLIC PANEL
BUN: 29 mg/dL — AB (ref 6–23)
CO2: 26 mEq/L (ref 19–32)
Calcium: 9 mg/dL (ref 8.4–10.5)
Chloride: 102 mEq/L (ref 96–112)
Creatinine, Ser: 1.6 mg/dL — ABNORMAL HIGH (ref 0.4–1.2)
GFR: 32.62 mL/min — AB (ref >60.00)
GLUCOSE: 105 mg/dL — AB (ref 70–99)
Potassium: 4.4 mEq/L (ref 3.5–5.1)
SODIUM: 133 meq/L — AB (ref 135–145)

## 2014-05-23 ENCOUNTER — Encounter: Payer: Self-pay | Admitting: Internal Medicine

## 2014-05-24 MED ORDER — SPIRONOLACTONE 50 MG PO TABS: ORAL_TABLET | ORAL | Status: DC

## 2014-05-26 ENCOUNTER — Encounter: Payer: Self-pay | Admitting: *Deleted

## 2014-06-06 ENCOUNTER — Ambulatory Visit
Admission: RE | Admit: 2014-06-06 | Discharge: 2014-06-06 | Disposition: A | Discharge status: Home / Self Care | Payer: Medicare Other | Source: Ambulatory Visit | Attending: Interventional Radiology | Admitting: Interventional Radiology

## 2014-06-06 ENCOUNTER — Ambulatory Visit (HOSPITAL_COMMUNITY)
Admission: RE | Admit: 2014-06-06 | Discharge: 2014-06-06 | Disposition: A | Discharge status: Home / Self Care | Payer: Medicare Other | Source: Ambulatory Visit | Attending: Interventional Radiology | Admitting: Interventional Radiology

## 2014-06-06 DIAGNOSIS — C649 Malignant neoplasm of unspecified kidney, except renal pelvis: Secondary | ICD-10-CM | POA: Insufficient documentation

## 2014-06-06 DIAGNOSIS — C642 Malignant neoplasm of left kidney, except renal pelvis: Secondary | ICD-10-CM

## 2014-06-06 MED ORDER — GADOBENATE DIMEGLUMINE 529 MG/ML IV SOLN
6.0000 mL | Freq: Once | INTRAVENOUS | Status: AC | PRN
  Administered 2014-06-06: 6 mL via INTRAVENOUS

## 2014-06-06 NOTE — Progress Notes (Signed)
Denies hematuria or other problems with urination.  Denies pain associated with procedure.  No complaints.  Overall doing well.    Merilynn Haydu Riki Rusk, RN 06/06/2014 2:15 PM

## 2014-06-08 ENCOUNTER — Encounter: Payer: Self-pay | Admitting: Internal Medicine

## 2014-06-22 ENCOUNTER — Encounter: Payer: Self-pay | Admitting: Internal Medicine

## 2014-06-22 DIAGNOSIS — R5381 Other malaise: Secondary | ICD-10-CM

## 2014-06-22 DIAGNOSIS — R5383 Other fatigue: Principal | ICD-10-CM

## 2014-06-27 ENCOUNTER — Other Ambulatory Visit (INDEPENDENT_AMBULATORY_CARE_PROVIDER_SITE_OTHER): Payer: Medicare Other

## 2014-06-27 DIAGNOSIS — R5381 Other malaise: Secondary | ICD-10-CM

## 2014-06-27 DIAGNOSIS — R5383 Other fatigue: Secondary | ICD-10-CM

## 2014-06-27 LAB — CBC WITH DIFFERENTIAL/PLATELET
BASOS ABS: 0 10*3/uL (ref 0.0–0.1)
Basophils Relative: 0.5 % (ref 0.0–3.0)
EOS ABS: 0.1 10*3/uL (ref 0.0–0.7)
Eosinophils Relative: 1.4 % (ref 0.0–5.0)
HCT: 32 % — ABNORMAL LOW (ref 36.0–46.0)
HEMOGLOBIN: 10.6 g/dL — AB (ref 12.0–15.0)
LYMPHS PCT: 23.3 % (ref 12.0–46.0)
Lymphs Abs: 1.9 10*3/uL (ref 0.7–4.0)
MCHC: 33.3 g/dL (ref 30.0–36.0)
MCV: 99.9 fl (ref 78.0–100.0)
Monocytes Absolute: 0.6 10*3/uL (ref 0.1–1.0)
Monocytes Relative: 7.8 % (ref 3.0–12.0)
NEUTROS PCT: 67 % (ref 43.0–77.0)
Neutro Abs: 5.4 10*3/uL (ref 1.4–7.7)
PLATELETS: 229 10*3/uL (ref 150.0–400.0)
RBC: 3.2 Mil/uL — AB (ref 3.87–5.11)
RDW: 13.1 % (ref 11.5–15.5)
WBC: 8 10*3/uL (ref 4.0–10.5)

## 2014-06-27 LAB — BASIC METABOLIC PANEL
BUN: 43 mg/dL — AB (ref 6–23)
CHLORIDE: 99 meq/L (ref 96–112)
CO2: 29 mEq/L (ref 19–32)
Calcium: 9.7 mg/dL (ref 8.4–10.5)
Creatinine, Ser: 1.9 mg/dL — ABNORMAL HIGH (ref 0.4–1.2)
GFR: 28.01 mL/min — AB (ref >60.00)
Glucose, Bld: 88 mg/dL (ref 70–99)
POTASSIUM: 4.6 meq/L (ref 3.5–5.1)
SODIUM: 136 meq/L (ref 135–145)

## 2014-06-27 LAB — HEPATIC FUNCTION PANEL
ALT: 10 U/L (ref 0–35)
AST: 27 U/L (ref 0–37)
Albumin: 4.2 g/dL (ref 3.5–5.2)
Alkaline Phosphatase: 42 U/L (ref 39–117)
BILIRUBIN DIRECT: 0.1 mg/dL (ref 0.0–0.3)
Total Bilirubin: 0.5 mg/dL (ref 0.2–1.2)
Total Protein: 7.6 g/dL (ref 6.0–8.3)

## 2014-07-04 ENCOUNTER — Other Ambulatory Visit (INDEPENDENT_AMBULATORY_CARE_PROVIDER_SITE_OTHER): Payer: Medicare Other

## 2014-07-04 ENCOUNTER — Encounter: Payer: Self-pay | Admitting: Internal Medicine

## 2014-07-04 ENCOUNTER — Ambulatory Visit (INDEPENDENT_AMBULATORY_CARE_PROVIDER_SITE_OTHER): Payer: Medicare Other | Admitting: Internal Medicine

## 2014-07-04 VITALS — BP 108/60 | HR 88 | Ht 64.0 in | Wt 131.8 lb

## 2014-07-04 DIAGNOSIS — K5521 Angiodysplasia of colon with hemorrhage: Secondary | ICD-10-CM

## 2014-07-04 DIAGNOSIS — R1031 Right lower quadrant pain: Secondary | ICD-10-CM

## 2014-07-04 LAB — VITAMIN B12: VITAMIN B 12: 444 pg/mL (ref 211–911)

## 2014-07-04 LAB — IBC PANEL
Iron: 52 ug/dL (ref 42–145)
SATURATION RATIOS: 16.7 % — AB (ref 20.0–50.0)
Transferrin: 222.2 mg/dL (ref 212.0–360.0)

## 2014-07-04 MED ORDER — MOVIPREP 100 G PO SOLR: 1.0000 | Freq: Once | ORAL | Status: DC

## 2014-07-04 NOTE — Progress Notes (Signed)
Paula Ward January 07, 1936 [id number]  Note: This dictation was prepared with Dragon digital system. Any transcriptional errors that result from this procedure are unintentional.   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 She sees me today because of right lower quadrant abdominal discomfort of 6 months duration. The pain bothers her when she stands up from a chair and is usually calmed down when she lays down. She denies being constipated. Her last colonoscopy in July 2010 showed AV malformation in the sigmoid colon and several in the descending colon. A prior colonoscopy in 2005 again showed AVMs. She has a history of left renal carcinoma. Her most recent MRI in August 2015 showed cryoablation site of the left kidney without evidence of recurrent cancer. She has had chronic anemia. Her hemoglobin is 10.6 with high MCV of 101. She has been on a self-imposed gluten-free diet which according to her improves her bowel habits.    Past Medical History  Diagnosis Date  . Anemia   . Pulmonary embolus 2003  . CREST syndrome   . GERD (gastroesophageal reflux disease)     w/ HH  . Angiodysplasia of stomach and duodenum   . Arthus phenomenon   . Pulmonary hypertension     followed by Dr Gaynell Face at [facility]  . Interstitial lung disease   . Scleroderma   . Rectal incontinence   . AVM (arteriovenous malformation)   . Nodule of kidney   . Uterine polyp   . Chronic kidney disease     Chronic mild renal insuffiency  . Breast cancer 1989    Left  . Renal cell carcinoma   . Candida esophagitis     Past Surgical History  Procedure Laterality Date  . Tonsillectomy    . Appendectomy  1962  . Breast lumpectomy  1989    left  . Ivc filter    . Kidney surgery      left   . Cataract extraction, bilateral Bilateral 12/2013    Allergies  Allergen Reactions   . Codeine Nausea Only    Hallucinations, too  . Other Nausea And Vomiting    "-mycin" antibiotics.   Also cause hallucinations.    Family history and social history have been reviewed.  Review of Systems: Denies dysphagia visible blood per rectum  The remainder of the 10 point ROS is negative except as outlined in the H&P  Physical Exam: General Appearance Well developed, in no distress Eyes  Non icteric  HEENT  Non traumatic, normocephalic  Mouth No lesion, tongue papillated, no cheilosis AVMs on her lower lip Neck Supple without adenopathy, thyroid not enlarged, no carotid bruits, no JVD Lungs Clear to auscultation bilaterally COR Normal S1, normal S2, regular rhythm, no murmur, quiet precordium Abdomen soft ABM around the umbilicus. Normoactive bowel sounds. Large posterior and that enterectomy scar in right lower quadrant. Tender on touch and palpation but no fullness Rectal very small specimen Hemoccult negative Extremities  No pedal edema Skin multiple AVMs throughout the upper and lower extremities Neurological Alert and oriented x 3 Psychological Normal mood and affect  Assessment and Plan:   Problem #57 78 year old white female with crest syndrome and telengiectasias of the skin as well as of the GI tract. Recheck CBC and iron studies as well as a sprue profile today  Problem #2 Chronic GI blood loss you to AVMs.  She is up-to-date on upper endoscopy  Problem#3:  with new-onset of right lower quadrant abdominal pain which  suggests musculoskeletal origin but it could be related to her colon as well. She claims to be improved on a gluten-free diet. We will check a sprue panel today. We will also schedule a colonoscopy to assess the right colon to rule out a cecal mass. Her recent MRI of the left kidney was negative for any intra-abdominal pathology but it was ordered to focus on left kidney.    Delfin Edis 07/04/2014

## 2014-07-04 NOTE — Addendum Note (Signed)
Addended by: Larina Bras on: 07/04/2014 02:56 PM   Modules accepted: Orders

## 2014-07-04 NOTE — Patient Instructions (Addendum)
You have been scheduled for a colonoscopy. Please follow written instructions given to you at your visit today.  Please pick up your prep kit at the pharmacy within the next 1-3 days. If you use inhalers (even only as needed), please bring them with you on the day of your procedure. Your physician has requested that you go to www.startemmi.com and enter the access code given to you at your visit today. This web site gives a general overview about your procedure. However, you should still follow specific instructions given to you by our office regarding your preparation for the procedure.  Your physician has requested that you go to the basement for the following lab work before leaving today: IBC, B12, Celiac Panel  CC:  Dr Leschber 

## 2014-07-05 ENCOUNTER — Other Ambulatory Visit: Payer: Self-pay | Admitting: *Deleted

## 2014-07-05 DIAGNOSIS — D509 Iron deficiency anemia, unspecified: Secondary | ICD-10-CM

## 2014-07-05 LAB — CELIAC PANEL 10
ENDOMYSIAL SCREEN: NEGATIVE
Gliadin IgA: 15.8 U/mL (ref <20)
Gliadin IgG: 4.3 U/mL (ref <20)
IgA: 200 mg/dL (ref 69–380)
Tissue Transglut Ab: 2.9 U/mL (ref <20)
Tissue Transglutaminase Ab, IgA: 5.8 U/mL (ref <20)

## 2014-07-12 ENCOUNTER — Encounter: Payer: Self-pay | Admitting: Internal Medicine

## 2014-07-12 ENCOUNTER — Ambulatory Visit (AMBULATORY_SURGERY_CENTER): Payer: Medicare Other | Admitting: Internal Medicine

## 2014-07-12 VITALS — BP 143/49 | HR 59 | Temp 98.3°F | Resp 28 | Ht 64.0 in | Wt 131.0 lb

## 2014-07-12 DIAGNOSIS — D126 Benign neoplasm of colon, unspecified: Secondary | ICD-10-CM

## 2014-07-12 DIAGNOSIS — D122 Benign neoplasm of ascending colon: Secondary | ICD-10-CM

## 2014-07-12 DIAGNOSIS — R1031 Right lower quadrant pain: Secondary | ICD-10-CM

## 2014-07-12 MED ORDER — SODIUM CHLORIDE 0.9 % IV SOLN: 500.0000 mL | INTRAVENOUS | Status: DC

## 2014-07-12 NOTE — Progress Notes (Signed)
Pt stable to RR

## 2014-07-12 NOTE — Progress Notes (Signed)
Called to room to assist during endoscopic procedure.  Patient ID and intended procedure confirmed with present staff. Received instructions for my participation in the procedure from the performing physician.

## 2014-07-12 NOTE — Op Note (Signed)
[place]  [address]. [place], [address]   COLONOSCOPY PROCEDURE REPORT  PATIENT: Paula Ward, Paula Ward  MR#: [id number] BIRTHDATE: 06/30/1936 , 77  yrs. old GENDER: female ENDOSCOPIST: Lafayette Dragon, MD REFERRED [id number] Asa Lente, M.D. PROCEDURE DATE:  07/12/2014 PROCEDURE:   Colonoscopy with cold biopsy polypectomy First Screening Colonoscopy - Avg.  risk and is 50 yrs.  old or older - No.  Prior Negative Screening - Now for repeat screening. N/A  History of Adenoma - Now for follow-up colonoscopy & has been > or = to 3 yrs.  N/A  Polyps Removed Today? Yes. ASA CLASS:   Class III INDICATIONS:rright lower quadrant abdominal pain.  History of crest syndrome.  Last colonoscopy July 2010 and revealed multiple AVMs.  MEDICATIONS: Monitored anesthesia care and Propofol 200 mg  DESCRIPTION OF PROCEDURE:   After the risks benefits and alternatives of the procedure were thoroughly explained, informed consent was obtained.  The digital rectal exam revealed no abnormalities of the rectum.   The LB [id number]  endoscope was introduced through the anus and advanced to the cecum, which was identified by both the appendix and ileocecal valve. No adverse events experienced.   The quality of the prep was excellent, using MoviPrep  The instrument was then slowly withdrawn as the colon was fully examined.      COLON FINDINGS: A sessile polyp measuring 2 mm in size was found in the ascending colon.  A polypectomy was performed with cold forceps.   There was moderate diverticulosis noted throughout the entire examined colon with associated angulation and tortuosity. there were no AVMs Retroflexed views revealed no abnormalities. The time to cecum=13 minutes 08 seconds.  Withdrawal time=6 minutes 50 seconds.  The scope was withdrawn and the procedure completed. COMPLICATIONS: There were no complications.  ENDOSCOPIC IMPRESSION: 1.   Sessile polyp was found in the  ascending colon; polypectomy was performed with cold forceps 2.   There was moderate diverticulosis noted throughout the entire examined colon ,more severe in the left colon 3. no evidence of AVMs 4. nothing to account for right lower quadrant abdominal pain. Suspect adhesions  RECOMMENDATIONS: 1.  Await pathology results 2.  Recall colonoscopy pending pathology  eSigned:  Lafayette Dragon, MD 07/12/2014 3:32 PM   cc:   PATIENT NAME:  Paula Ward, Paula Ward MR#: [id number]

## 2014-07-12 NOTE — Patient Instructions (Signed)
Colon polyp x 1 removed today. Handouts given on polyps, & diverticulosis. Resume current medications.  Call us with any questions or concerns. Thank you!  YOU HAD AN ENDOSCOPIC PROCEDURE TODAY AT [facility]: Refer to the procedure report that was given to you for any specific questions about what was found during the examination.  If the procedure report does not answer your questions, please call your gastroenterologist to clarify.  If you requested that your care partner not be given the details of your procedure findings, then the procedure report has been included in a sealed envelope for you to review at your convenience later.  YOU SHOULD EXPECT: Some feelings of bloating in the abdomen. Passage of more gas than usual.  Walking can help get rid of the air that was put into your GI tract during the procedure and reduce the bloating. If you had a lower endoscopy (such as a colonoscopy or flexible sigmoidoscopy) you may notice spotting of blood in your stool or on the toilet paper. If you underwent a bowel prep for your procedure, then you may not have a normal bowel movement for a few days.  DIET: Your first meal following the procedure should be a light meal and then it is ok to progress to your normal diet.  A half-sandwich or bowl of soup is an example of a good first meal.  Heavy or fried foods are harder to digest and may make you feel nauseous or bloated.  Likewise meals heavy in dairy and vegetables can cause extra gas to form and this can also increase the bloating.  Drink plenty of fluids but you should avoid alcoholic beverages for 24 hours.  ACTIVITY: Your care partner should take you home directly after the procedure.  You should plan to take it easy, moving slowly for the rest of the day.  You can resume normal activity the day after the procedure however you should NOT DRIVE or use heavy machinery for 24 hours (because of the sedation medicines used during the test).     SYMPTOMS TO REPORT IMMEDIATELY: A gastroenterologist can be reached at any hour.  During normal business hours, 8:30 AM to 5:00 PM Monday through Friday, call [phone].  After hours and on weekends, please call the GI answering service at [phone] who will take a message and have the physician on call contact you.   Following lower endoscopy (colonoscopy or flexible sigmoidoscopy):  Excessive amounts of blood in the stool  Significant tenderness or worsening of abdominal pains  Swelling of the abdomen that is new, acute  Fever of 100F or higher  Following upper endoscopy (EGD)  Vomiting of blood or coffee ground material  New chest pain or pain under the shoulder blades  Painful or persistently difficult swallowing  New shortness of breath  Fever of 100F or higher  Black, tarry-looking stools  FOLLOW UP: If any biopsies were taken you will be contacted by phone or by letter within the next 1-3 weeks.  Call your gastroenterologist if you have not heard about the biopsies in 3 weeks.  Our staff will call the home number listed on your records the next business day following your procedure to check on you and address any questions or concerns that you may have at that time regarding the information given to you following your procedure. This is a courtesy call and so if there is no answer at the home number and we have not heard from you  through the emergency physician on call, we will assume that you have returned to your regular daily activities without incident.  SIGNATURES/CONFIDENTIALITY: You and/or your care partner have signed paperwork which will be entered into your electronic medical record.  These signatures attest to the fact that that the information above on your After Visit Summary has been reviewed and is understood.  Full responsibility of the confidentiality of this discharge information lies with you and/or your care-partner.

## 2014-07-13 ENCOUNTER — Telehealth: Payer: Self-pay | Admitting: *Deleted

## 2014-07-13 NOTE — Telephone Encounter (Signed)
  Follow up Call-  Call back number 07/12/2014 05/11/2013  Post procedure Call Back phone  # [phone]  Permission to leave phone message Yes Yes     Patient questions:  Do you have a fever, pain , or abdominal swelling? No. Pain Score  0 *  Have you tolerated food without any problems? Yes.    Have you been able to return to your normal activities? Yes.    Do you have any questions about your discharge instructions: Diet   No. Medications  No. Follow up visit  No.  Do you have questions or concerns about your Care? No.  Actions: * If pain score is 4 or above: No action needed, pain <4. Patient stating she had a lot of gas last night with difficulty passing flatus. Improved at 23:00. Presently no pain, fever, distention or bleeding. Informed to call with any of these difficulties occur.

## 2014-07-18 ENCOUNTER — Encounter: Payer: Self-pay | Admitting: Internal Medicine

## 2014-07-27 LAB — HM MAMMOGRAPHY

## 2014-08-04 ENCOUNTER — Other Ambulatory Visit: Payer: Self-pay

## 2014-08-09 ENCOUNTER — Encounter: Payer: Self-pay | Admitting: Internal Medicine

## 2014-08-15 ENCOUNTER — Encounter: Payer: Self-pay | Admitting: Internal Medicine

## 2014-08-15 ENCOUNTER — Other Ambulatory Visit (INDEPENDENT_AMBULATORY_CARE_PROVIDER_SITE_OTHER): Payer: Medicare Other

## 2014-08-15 ENCOUNTER — Ambulatory Visit (INDEPENDENT_AMBULATORY_CARE_PROVIDER_SITE_OTHER): Payer: Medicare Other | Admitting: Internal Medicine

## 2014-08-15 VITALS — BP 128/52 | HR 75 | Temp 98.0°F | Resp 14 | Ht 64.0 in | Wt 132.0 lb

## 2014-08-15 DIAGNOSIS — I10 Essential (primary) hypertension: Secondary | ICD-10-CM

## 2014-08-15 DIAGNOSIS — Z23 Encounter for immunization: Secondary | ICD-10-CM

## 2014-08-15 DIAGNOSIS — I272 Pulmonary hypertension, unspecified: Secondary | ICD-10-CM

## 2014-08-15 DIAGNOSIS — I270 Primary pulmonary hypertension: Secondary | ICD-10-CM

## 2014-08-15 DIAGNOSIS — Z418 Encounter for other procedures for purposes other than remedying health state: Secondary | ICD-10-CM | POA: Diagnosis not present

## 2014-08-15 DIAGNOSIS — K219 Gastro-esophageal reflux disease without esophagitis: Secondary | ICD-10-CM

## 2014-08-15 DIAGNOSIS — N289 Disorder of kidney and ureter, unspecified: Secondary | ICD-10-CM

## 2014-08-15 DIAGNOSIS — M349 Systemic sclerosis, unspecified: Secondary | ICD-10-CM | POA: Diagnosis not present

## 2014-08-15 DIAGNOSIS — D649 Anemia, unspecified: Secondary | ICD-10-CM

## 2014-08-15 DIAGNOSIS — Z299 Encounter for prophylactic measures, unspecified: Secondary | ICD-10-CM

## 2014-08-15 LAB — CBC
HCT: 32.3 % — ABNORMAL LOW (ref 36.0–46.0)
Hemoglobin: 10.5 g/dL — ABNORMAL LOW (ref 12.0–15.0)
MCHC: 32.6 g/dL (ref 30.0–36.0)
MCV: 99.1 fl (ref 78.0–100.0)
Platelets: 253 10*3/uL (ref 150.0–400.0)
RBC: 3.26 Mil/uL — AB (ref 3.87–5.11)
RDW: 13.5 % (ref 11.5–15.5)
WBC: 7.6 10*3/uL (ref 4.0–10.5)

## 2014-08-15 LAB — COMPREHENSIVE METABOLIC PANEL
ALBUMIN: 3.8 g/dL (ref 3.5–5.2)
ALT: 12 U/L (ref 0–35)
AST: 28 U/L (ref 0–37)
Alkaline Phosphatase: 45 U/L (ref 39–117)
BUN: 44 mg/dL — ABNORMAL HIGH (ref 6–23)
CALCIUM: 9.5 mg/dL (ref 8.4–10.5)
CO2: 27 meq/L (ref 19–32)
Chloride: 102 mEq/L (ref 96–112)
Creatinine, Ser: 1.8 mg/dL — ABNORMAL HIGH (ref 0.4–1.2)
GFR: 29.84 mL/min — ABNORMAL LOW (ref >60.00)
Glucose, Bld: 79 mg/dL (ref 70–99)
POTASSIUM: 4.2 meq/L (ref 3.5–5.1)
Sodium: 136 mEq/L (ref 135–145)
Total Bilirubin: 0.6 mg/dL (ref 0.2–1.2)
Total Protein: 7.7 g/dL (ref 6.0–8.3)

## 2014-08-15 MED ORDER — FLUTICASONE PROPIONATE 50 MCG/ACT NA SUSP: 2.0000 | Freq: Every day | NASAL | Status: DC

## 2014-08-15 NOTE — Assessment & Plan Note (Signed)
Bp well controlled at this time and check BMP today.

## 2014-08-15 NOTE — Assessment & Plan Note (Signed)
She continues on opsumit and needs every 2 month monitoring of her CMP and CBC. She does not need oxygen at this time and follows with [facility].

## 2014-08-15 NOTE — Assessment & Plan Note (Signed)
Check CBC for stability today.

## 2014-08-15 NOTE — Assessment & Plan Note (Signed)
Fairly stable at this time although she does have pulmonary HTN as a result and dry mouth at night (uses biotene prn)

## 2014-08-15 NOTE — Assessment & Plan Note (Signed)
Some degeneration over time of her creatinine and will check BMP today.

## 2014-08-15 NOTE — Patient Instructions (Addendum)
Try to remember the flonase every day and for the next week you can take it twice a day to help calm down the sinuses. We will check your blood work today and then have the order in for you to get it checked every 2 months.   We are not changing your medicines today and will call you with the blood test results.   We have given you the shingles shot today.  Your toe may be broken, keep taping it to the next toe for support. You can use heat and ice on the toe (ice for the swelling and heat for pain). Keep the ice or heat on for no more than 15 minutes and take a 15 minute break before you use heat or ice again. The toe should heal over the next 3-4 weeks.   Buddy Taping of Toes We have taped your toes together to keep them from moving. This is called "buddy taping" since we used a part of your own body to keep the injured part still. We placed soft padding between your toes to keep them from rubbing against each other. Buddy taping will help with healing and to reduce pain. Keep your toes buddy taped together for as long as directed by your caregiver. HOME CARE INSTRUCTIONS   Raise your injured area above the level of your heart while sitting or lying down. Prop it up with pillows.  An ice pack used every twenty minutes, while awake, for the first one to two days may be helpful. Put ice in a plastic bag and put a towel between the bag and your skin.  Watch for signs that the taping is too tight. These signs may be:  Numbness of your taped toes.  Coolness of your taped toes.  Color change in the area beyond the tape.  Increased pain.  If you have any of these signs, loosen or rewrap the tape. If you need to loosen or rewrap the buddy tape, make sure you use the padding again. SEEK IMMEDIATE MEDICAL CARE IF:   You have worse pain, swelling, inflammation (soreness), drainage or bleeding after you rewrap the tape.  Any new problems occur. MAKE SURE YOU:   Understand these  instructions.  Will watch your condition.  Will get help right away if you are not doing well or get worse. Document Released: 07/10/2004 Document Revised: 12/29/2011 Document Reviewed: 10/03/2008 [facility] Patient Information 2015 [place], [place]. This information is not intended to replace advice given to you by your health care provider. Make sure you discuss any questions you have with your health care provider.

## 2014-08-15 NOTE — Assessment & Plan Note (Signed)
No symptoms at this time.

## 2014-08-15 NOTE — Progress Notes (Signed)
   Subjective:    Patient ID: Paula Ward, female    DOB: 1936-05-08, 78 y.o.   MRN: [id number]  HPI The patient is a 78 year old female who has history of scleroderma, pulmonary hypertension, history of PE, history of breast cancer, GERD, hypertension, CKD. She is doing well overall but stubbed her little toe of right foot about 1 week ago and it is causing her some pain. She has it taped to the next toe, there has been some mild swelling but no skin injury or breakdown. She has not had to take anything over the counter for pain. She is not having SOB and has not had to use oxygen for years. She is still going to [facility] to follow her pulmonary HTN and sees them next in November. She denies chest pains, abdominal pain or GERD. She does have some problems with the scleroderma causing pain in her back but massage therapy helps to bring that back in place.   Review of Systems  Constitutional: Negative for activity change, appetite change, fatigue and unexpected weight change.  HENT: Negative.   Eyes: Negative.   Respiratory: Negative for cough, chest tightness, shortness of breath and wheezing.   Cardiovascular: Negative for chest pain, palpitations and leg swelling.  Gastrointestinal: Negative for abdominal pain, diarrhea, constipation and abdominal distention.  Musculoskeletal: Positive for arthralgias and myalgias. Negative for gait problem.  Skin: Negative.   Neurological: Negative for dizziness, weakness, light-headedness and headaches.  Psychiatric/Behavioral: Negative.       Objective:   Physical Exam  Constitutional: She is oriented to person, place, and time. She appears well-developed and well-nourished. No distress.  HENT:  Head: Normocephalic and atraumatic.  Eyes: EOM are normal.  Neck: Normal range of motion.  Cardiovascular: Normal rate and regular rhythm.   Murmur heard. Pulmonary/Chest: Effort normal. No respiratory distress. She has wheezes. She has no rales.  Mild  expiratory crackle  Abdominal: Soft. Bowel sounds are normal.  Neurological: She is alert and oriented to person, place, and time. Coordination normal.  Skin: Skin is warm and dry.   Filed Vitals:   08/15/14 0932  BP: 128/52  Pulse: 75  Temp: 98 F (36.7 C)  TempSrc: Oral  Resp: 14  Height: _0  (1.626 m)  Weight: 132 lb (59.875 kg)  SpO2: 99%      Assessment & Plan:

## 2014-08-15 NOTE — Progress Notes (Signed)
Pre visit review using our clinic review tool, if applicable. No additional management support is needed unless otherwise documented below in the visit note.

## 2014-08-23 ENCOUNTER — Other Ambulatory Visit: Payer: Self-pay | Admitting: Geriatric Medicine

## 2014-08-23 ENCOUNTER — Other Ambulatory Visit: Payer: Medicare Other

## 2014-08-23 ENCOUNTER — Telehealth: Payer: Self-pay | Admitting: Internal Medicine

## 2014-08-23 DIAGNOSIS — R309 Painful micturition, unspecified: Secondary | ICD-10-CM

## 2014-08-23 NOTE — Telephone Encounter (Signed)
Patient thinks she has an UTI and would like to bring in a urine specimen.,  Please advise.

## 2014-08-25 ENCOUNTER — Other Ambulatory Visit: Payer: Self-pay | Admitting: Geriatric Medicine

## 2014-08-25 MED ORDER — SULFAMETHOXAZOLE-TRIMETHOPRIM 800-160 MG PO TABS: 1.0000 | ORAL_TABLET | Freq: Two times a day (BID) | ORAL | Status: DC

## 2014-08-25 NOTE — Telephone Encounter (Signed)
Her urine culture does appear to be growing bacteria. Bactrim sent into her pharmacy 1 tab twice a day for 5 days.

## 2014-08-25 NOTE — Telephone Encounter (Signed)
Patient aware and will go pick up medicaiton

## 2014-08-25 NOTE — Telephone Encounter (Signed)
Patient is requesting call back in regards to results.  Can also reach at [phone].

## 2014-08-26 LAB — URINE CULTURE

## 2014-08-29 ENCOUNTER — Encounter: Payer: Self-pay | Admitting: Internal Medicine

## 2014-09-01 ENCOUNTER — Other Ambulatory Visit: Payer: Self-pay | Admitting: Geriatric Medicine

## 2014-09-01 ENCOUNTER — Telehealth: Payer: Self-pay | Admitting: Internal Medicine

## 2014-09-01 ENCOUNTER — Other Ambulatory Visit: Payer: Self-pay | Admitting: Internal Medicine

## 2014-09-01 ENCOUNTER — Other Ambulatory Visit (INDEPENDENT_AMBULATORY_CARE_PROVIDER_SITE_OTHER): Payer: Medicare Other

## 2014-09-01 DIAGNOSIS — N159 Renal tubulo-interstitial disease, unspecified: Secondary | ICD-10-CM

## 2014-09-01 LAB — URINALYSIS, ROUTINE W REFLEX MICROSCOPIC
Nitrite: NEGATIVE
PH: 5.5 (ref 5.0–8.0)
SPECIFIC GRAVITY, URINE: 1.025 (ref 1.000–1.030)
Total Protein, Urine: 100 — AB
Urine Glucose: NEGATIVE
Urobilinogen, UA: 0.2 (ref 0.0–1.0)

## 2014-09-01 MED ORDER — CIPROFLOXACIN HCL 250 MG PO TABS: 250.0000 mg | ORAL_TABLET | Freq: Two times a day (BID) | ORAL | Status: DC

## 2014-09-01 NOTE — Telephone Encounter (Signed)
Patient was requesting an appointment today but Dr. Doug Sou is booked up.  She does not want to go to another office.  She is requesting a return phone call from Amy.  Patient believes once she completed her med for kidney infection her symptoms came back.  Patient states she is working at [employer] and could walk over to give as urine specimen.

## 2014-09-01 NOTE — Telephone Encounter (Signed)
Spoke with patient. Orders have been placed for a urine culture. She will come leave a sample at our lab.

## 2014-09-01 NOTE — Progress Notes (Signed)
I spoke to patient and informed her to go pick up the antibiotic.

## 2014-09-04 ENCOUNTER — Telehealth: Payer: Self-pay | Admitting: Internal Medicine

## 2014-09-04 DIAGNOSIS — D509 Iron deficiency anemia, unspecified: Secondary | ICD-10-CM

## 2014-09-04 LAB — URINE CULTURE: Colony Count: >=100000

## 2014-09-04 NOTE — Telephone Encounter (Signed)
I am not sure. Please have CBC repeated in our office and Iron studies., if still low, will need Iron infusion

## 2014-09-04 NOTE — Telephone Encounter (Signed)
Spoke with patient and she went to [facility] last Thursday for 6 month check up(pulmonary hypertension). She states she saw her lab results on computer and her hgb is 7.9. States it was 10 with her PCP. She is being treated for a kidney infection and is on Cipro. Please, advise.

## 2014-09-05 ENCOUNTER — Other Ambulatory Visit (INDEPENDENT_AMBULATORY_CARE_PROVIDER_SITE_OTHER): Payer: Medicare Other

## 2014-09-05 DIAGNOSIS — D509 Iron deficiency anemia, unspecified: Secondary | ICD-10-CM

## 2014-09-05 LAB — CBC WITH DIFFERENTIAL/PLATELET
BASOS ABS: 0 10*3/uL (ref 0.0–0.1)
Basophils Relative: 0.8 % (ref 0.0–3.0)
EOS ABS: 0.1 10*3/uL (ref 0.0–0.7)
Eosinophils Relative: 1.2 % (ref 0.0–5.0)
HCT: 27.8 % — ABNORMAL LOW (ref 36.0–46.0)
Hemoglobin: 9.2 g/dL — ABNORMAL LOW (ref 12.0–15.0)
Lymphocytes Relative: 22.7 % (ref 12.0–46.0)
Lymphs Abs: 1.4 10*3/uL (ref 0.7–4.0)
MCHC: 33 g/dL (ref 30.0–36.0)
MCV: 98.6 fl (ref 78.0–100.0)
Monocytes Absolute: 0.3 10*3/uL (ref 0.1–1.0)
Monocytes Relative: 4.7 % (ref 3.0–12.0)
NEUTROS ABS: 4.4 10*3/uL (ref 1.4–7.7)
Neutrophils Relative %: 70.6 % (ref 43.0–77.0)
PLATELETS: 285 10*3/uL (ref 150.0–400.0)
RBC: 2.82 Mil/uL — ABNORMAL LOW (ref 3.87–5.11)
RDW: 14.5 % (ref 11.5–15.5)
WBC: 6.2 10*3/uL (ref 4.0–10.5)

## 2014-09-05 NOTE — Telephone Encounter (Signed)
Labs in EPIC. Patient notified and she will come for labs.

## 2014-09-06 ENCOUNTER — Other Ambulatory Visit: Payer: Self-pay | Admitting: *Deleted

## 2014-09-06 DIAGNOSIS — D649 Anemia, unspecified: Secondary | ICD-10-CM

## 2014-09-06 LAB — IBC PANEL
Iron: 48 ug/dL (ref 42–145)
Saturation Ratios: 15.9 % — ABNORMAL LOW (ref 20.0–50.0)
TRANSFERRIN: 215.2 mg/dL (ref 212.0–360.0)

## 2014-09-28 ENCOUNTER — Telehealth: Payer: Self-pay | Admitting: *Deleted

## 2014-09-28 NOTE — Telephone Encounter (Signed)
Patient will come for labs.

## 2014-09-28 NOTE — Telephone Encounter (Signed)
-----  Message from Hulan Saas, RN sent at 07/05/2014  9:19 AM EDT ----- Call and remind patient due for CBC, iron panel for DB on 10/04/14, Lab in EPIC.

## 2014-10-02 ENCOUNTER — Other Ambulatory Visit (INDEPENDENT_AMBULATORY_CARE_PROVIDER_SITE_OTHER): Payer: Medicare Other

## 2014-10-02 DIAGNOSIS — D649 Anemia, unspecified: Secondary | ICD-10-CM | POA: Diagnosis not present

## 2014-10-02 LAB — CBC WITH DIFFERENTIAL/PLATELET
BASOS ABS: 0 10*3/uL (ref 0.0–0.1)
Basophils Relative: 0.3 % (ref 0.0–3.0)
Eosinophils Absolute: 0.1 10*3/uL (ref 0.0–0.7)
Eosinophils Relative: 1.3 % (ref 0.0–5.0)
Hemoglobin: 8.6 g/dL — ABNORMAL LOW (ref 12.0–15.0)
LYMPHS ABS: 1.4 10*3/uL (ref 0.7–4.0)
Lymphocytes Relative: 24.7 % (ref 12.0–46.0)
MCHC: 32.2 g/dL (ref 30.0–36.0)
MCV: 98.7 fl (ref 78.0–100.0)
Monocytes Absolute: 0.3 10*3/uL (ref 0.1–1.0)
Monocytes Relative: 5.8 % (ref 3.0–12.0)
Neutro Abs: 3.9 10*3/uL (ref 1.4–7.7)
Neutrophils Relative %: 67.9 % (ref 43.0–77.0)
PLATELETS: 216 10*3/uL (ref 150.0–400.0)
RBC: 2.71 Mil/uL — ABNORMAL LOW (ref 3.87–5.11)
RDW: 14.2 % (ref 11.5–15.5)
WBC: 5.8 10*3/uL (ref 4.0–10.5)

## 2014-10-02 LAB — IBC PANEL
IRON: 30 ug/dL — AB (ref 42–145)
Saturation Ratios: 10.1 % — ABNORMAL LOW (ref 20.0–50.0)
Transferrin: 211.6 mg/dL — ABNORMAL LOW (ref 212.0–360.0)

## 2014-10-03 ENCOUNTER — Other Ambulatory Visit: Payer: Self-pay | Admitting: *Deleted

## 2014-10-03 DIAGNOSIS — D509 Iron deficiency anemia, unspecified: Secondary | ICD-10-CM

## 2014-10-10 ENCOUNTER — Other Ambulatory Visit (HOSPITAL_COMMUNITY): Payer: Self-pay | Admitting: Internal Medicine

## 2014-10-10 ENCOUNTER — Encounter (HOSPITAL_COMMUNITY)
Admission: RE | Admit: 2014-10-10 | Discharge: 2014-10-10 | Disposition: A | Discharge status: Home / Self Care | Payer: Medicare Other | Source: Ambulatory Visit | Attending: Internal Medicine | Admitting: Internal Medicine

## 2014-10-10 ENCOUNTER — Encounter (HOSPITAL_COMMUNITY): Payer: Self-pay

## 2014-10-10 VITALS — BP 124/50 | HR 72 | Temp 97.8°F | Resp 18 | Ht 64.0 in

## 2014-10-10 DIAGNOSIS — D509 Iron deficiency anemia, unspecified: Secondary | ICD-10-CM | POA: Diagnosis present

## 2014-10-10 MED ORDER — SODIUM CHLORIDE 0.9 % IV SOLN
Freq: Once | INTRAVENOUS | Status: AC
  Administered 2014-10-10: 250 mL via INTRAVENOUS

## 2014-10-10 MED ORDER — SODIUM CHLORIDE 0.9 % IV SOLN
510.0000 mg | Freq: Once | INTRAVENOUS | Status: AC
  Administered 2014-10-10: 510 mg via INTRAVENOUS
  Filled 2014-10-10: qty 17

## 2014-10-10 NOTE — Discharge Instructions (Signed)

## 2014-10-17 ENCOUNTER — Encounter (HOSPITAL_COMMUNITY): Payer: Self-pay

## 2014-10-17 ENCOUNTER — Encounter (HOSPITAL_COMMUNITY)
Admission: RE | Admit: 2014-10-17 | Discharge: 2014-10-17 | Disposition: A | Discharge status: Home / Self Care | Payer: Medicare Other | Source: Ambulatory Visit | Attending: Internal Medicine | Admitting: Internal Medicine

## 2014-10-17 VITALS — BP 133/47 | HR 77 | Temp 97.5°F | Resp 20

## 2014-10-17 DIAGNOSIS — D509 Iron deficiency anemia, unspecified: Secondary | ICD-10-CM | POA: Diagnosis not present

## 2014-10-17 MED ORDER — SODIUM CHLORIDE 0.9 % IV SOLN
510.0000 mg | Freq: Once | INTRAVENOUS | Status: AC
  Administered 2014-10-17: 510 mg via INTRAVENOUS
  Filled 2014-10-17: qty 17

## 2014-10-17 MED ORDER — SODIUM CHLORIDE 0.9 % IV SOLN
Freq: Once | INTRAVENOUS | Status: AC
  Administered 2014-10-17: 15:00:00 via INTRAVENOUS

## 2014-10-17 NOTE — Discharge Instructions (Signed)

## 2014-10-23 ENCOUNTER — Other Ambulatory Visit (INDEPENDENT_AMBULATORY_CARE_PROVIDER_SITE_OTHER): Payer: 59

## 2014-10-23 ENCOUNTER — Ambulatory Visit (INDEPENDENT_AMBULATORY_CARE_PROVIDER_SITE_OTHER): Payer: 59 | Admitting: Internal Medicine

## 2014-10-23 ENCOUNTER — Encounter: Payer: Self-pay | Admitting: Internal Medicine

## 2014-10-23 VITALS — BP 120/60 | HR 83 | Temp 97.8°F | Ht 64.0 in | Wt 131.2 lb

## 2014-10-23 DIAGNOSIS — J209 Acute bronchitis, unspecified: Secondary | ICD-10-CM

## 2014-10-23 DIAGNOSIS — D509 Iron deficiency anemia, unspecified: Secondary | ICD-10-CM

## 2014-10-23 LAB — CBC WITH DIFFERENTIAL/PLATELET
BASOS ABS: 0 10*3/uL (ref 0.0–0.1)
Basophils Relative: 0.3 % (ref 0.0–3.0)
EOS ABS: 0.1 10*3/uL (ref 0.0–0.7)
EOS PCT: 1 % (ref 0.0–5.0)
HEMATOCRIT: 30.3 % — AB (ref 36.0–46.0)
Hemoglobin: 9.6 g/dL — ABNORMAL LOW (ref 12.0–15.0)
LYMPHS ABS: 1.9 10*3/uL (ref 0.7–4.0)
LYMPHS PCT: 21.7 % (ref 12.0–46.0)
MCHC: 31.8 g/dL (ref 30.0–36.0)
MCV: 100.1 fl — ABNORMAL HIGH (ref 78.0–100.0)
Monocytes Absolute: 0.6 10*3/uL (ref 0.1–1.0)
Monocytes Relative: 6.4 % (ref 3.0–12.0)
NEUTROS PCT: 70.6 % (ref 43.0–77.0)
Neutro Abs: 6.2 10*3/uL (ref 1.4–7.7)
PLATELETS: 226 10*3/uL (ref 150.0–400.0)
RBC: 3.02 Mil/uL — ABNORMAL LOW (ref 3.87–5.11)
RDW: 14.6 % (ref 11.5–15.5)
WBC: 8.8 10*3/uL (ref 4.0–10.5)

## 2014-10-23 MED ORDER — HYDROCODONE-HOMATROPINE 5-1.5 MG/5ML PO SYRP: 5.0000 mL | ORAL_SOLUTION | Freq: Four times a day (QID) | ORAL | Status: DC | PRN

## 2014-10-23 MED ORDER — AZITHROMYCIN 250 MG PO TABS: ORAL_TABLET | ORAL | Status: DC

## 2014-10-23 NOTE — Progress Notes (Signed)
Pre visit review using our clinic review tool, if applicable. No additional management support is needed unless otherwise documented below in the visit note.

## 2014-10-23 NOTE — Patient Instructions (Signed)
Carry room temperature water and sip liberally after coughing.

## 2014-10-23 NOTE — Progress Notes (Signed)
   Subjective:    Patient ID: Paula Ward, female    DOB: 1936-03-30, 79 y.o.   MRN: [id number]  HPI  She has had symptoms since 10/13/14 as paroxysmal cough lasting up to a minute. Sputum production is of clear material  The cough is worsened by talking.  Tessalon Perles and a prescription cough syrup containing codeine were of only partial relief. She's also been using an Neti pot although she has no upper respiratory tract infection symptoms.  Cough is associated with profound fatigue but she also has iron deficiency anemia  She received iron infusions 12/22 and 12/29 from the gastroneurologist  She has a history of scleroderma as well as pulmonary hypertension secondary to that process. She is followed by Dr. Eddie Dibbles at [facility] .  She has never smoked.  Review of Systems Frontal headache, facial pain , nasal purulence, dental pain, sore throat , otic pain or otic discharge denied. No fever , chills or sweats.    Objective:   Physical Exam  Sclerodermatous facial changes are suggested Nares are dry. She has dry rales in the lower half of the posterior thorax.  General appearance:thin but adequately nourished; no acute distress or increased work of breathing is present.  No  lymphadenopathy about the head, neck, or axilla noted.  Eyes: No conjunctival inflammation or lid edema is present. There is no scleral icterus. Ears:  External ear exam shows no significant lesions or deformities.  Otoscopic examination reveals clear canals, tympanic membranes are intact bilaterally without bulging, retraction, inflammation or discharge. Nose:  External nasal examination shows no deformity or inflammation.  Oral exam: Dental hygiene is good; lips and gums are healthy appearing.There is no oropharyngeal erythema or exudate noted.  Neck:  No deformities, thyromegaly, masses, or tenderness noted.   Supple with full range of motion without pain. Heart:  Normal rate and regular rhythm. S1 and S2  normal without gallop, murmur, click, rub or other extra sounds.  Lungs:No increased work of breathing.   Extremities:  No cyanosis, edema, or clubbing  noted x     Assessment & Plan:  #1 acute bronchitis w/o bronchospasm #2 iron deficiency anemia #3 pulmonary HTN #4 scleroderma Plan: See orders and recommendations

## 2014-10-30 ENCOUNTER — Other Ambulatory Visit: Payer: Self-pay | Admitting: *Deleted

## 2014-10-30 MED ORDER — OMEPRAZOLE 40 MG PO CPDR: 40.0000 mg | DELAYED_RELEASE_CAPSULE | Freq: Two times a day (BID) | ORAL | Status: DC

## 2014-11-09 ENCOUNTER — Telehealth: Payer: Self-pay | Admitting: *Deleted

## 2014-11-09 NOTE — Telephone Encounter (Signed)
-----  Message from Hulan Saas, RN sent at 10/03/2014  9:41 AM EST ----- Call and remind patient due for CBC for DB on 11/11/14. Lab in EPIC.

## 2014-11-09 NOTE — Telephone Encounter (Signed)
Patient notified of appointment and she will come for labs.

## 2014-11-13 ENCOUNTER — Other Ambulatory Visit (INDEPENDENT_AMBULATORY_CARE_PROVIDER_SITE_OTHER): Payer: 59

## 2014-11-13 DIAGNOSIS — D509 Iron deficiency anemia, unspecified: Secondary | ICD-10-CM | POA: Diagnosis not present

## 2014-11-13 LAB — CBC WITH DIFFERENTIAL/PLATELET
BASOS PCT: 0.4 % (ref 0.0–3.0)
Basophils Absolute: 0 10*3/uL (ref 0.0–0.1)
EOS ABS: 0.1 10*3/uL (ref 0.0–0.7)
Eosinophils Relative: 1.1 % (ref 0.0–5.0)
HCT: 30.6 % — ABNORMAL LOW (ref 36.0–46.0)
Hemoglobin: 10.4 g/dL — ABNORMAL LOW (ref 12.0–15.0)
Lymphocytes Relative: 27.3 % (ref 12.0–46.0)
Lymphs Abs: 1.5 10*3/uL (ref 0.7–4.0)
MCHC: 34 g/dL (ref 30.0–36.0)
MCV: 98.1 fl (ref 78.0–100.0)
Monocytes Absolute: 0.3 10*3/uL (ref 0.1–1.0)
Monocytes Relative: 5.6 % (ref 3.0–12.0)
NEUTROS PCT: 65.6 % (ref 43.0–77.0)
Neutro Abs: 3.6 10*3/uL (ref 1.4–7.7)
PLATELETS: 209 10*3/uL (ref 150.0–400.0)
RBC: 3.13 Mil/uL — ABNORMAL LOW (ref 3.87–5.11)
RDW: 15.5 % (ref 11.5–15.5)
WBC: 5.5 10*3/uL (ref 4.0–10.5)

## 2014-11-13 LAB — IBC PANEL
Iron: 75 ug/dL (ref 42–145)
SATURATION RATIOS: 30.1 % (ref 20.0–50.0)
Transferrin: 178 mg/dL — ABNORMAL LOW (ref 212.0–360.0)

## 2014-11-14 ENCOUNTER — Other Ambulatory Visit: Payer: Self-pay

## 2014-11-14 DIAGNOSIS — D509 Iron deficiency anemia, unspecified: Secondary | ICD-10-CM

## 2014-11-16 ENCOUNTER — Encounter: Payer: Self-pay | Admitting: Internal Medicine

## 2014-11-16 ENCOUNTER — Encounter (INDEPENDENT_AMBULATORY_CARE_PROVIDER_SITE_OTHER): Payer: 59

## 2014-11-16 ENCOUNTER — Ambulatory Visit (INDEPENDENT_AMBULATORY_CARE_PROVIDER_SITE_OTHER): Payer: 59 | Admitting: Internal Medicine

## 2014-11-16 VITALS — BP 110/68 | Temp 98.1°F | Ht 64.0 in | Wt 130.0 lb

## 2014-11-16 DIAGNOSIS — I270 Primary pulmonary hypertension: Secondary | ICD-10-CM

## 2014-11-16 DIAGNOSIS — I272 Pulmonary hypertension, unspecified: Secondary | ICD-10-CM

## 2014-11-16 DIAGNOSIS — M349 Systemic sclerosis, unspecified: Secondary | ICD-10-CM

## 2014-11-16 DIAGNOSIS — I10 Essential (primary) hypertension: Secondary | ICD-10-CM

## 2014-11-16 DIAGNOSIS — N183 Chronic kidney disease, stage 3 unspecified: Secondary | ICD-10-CM

## 2014-11-16 LAB — COMPREHENSIVE METABOLIC PANEL
ALBUMIN: 4.3 g/dL (ref 3.5–5.2)
ALT: 11 U/L (ref 0–35)
AST: 22 U/L (ref 0–37)
Alkaline Phosphatase: 47 U/L (ref 39–117)
BUN: 29 mg/dL — ABNORMAL HIGH (ref 6–23)
CALCIUM: 9.8 mg/dL (ref 8.4–10.5)
CHLORIDE: 105 meq/L (ref 96–112)
CO2: 24 mEq/L (ref 19–32)
CREATININE: 1.4 mg/dL — AB (ref 0.40–1.20)
GFR: 38.83 mL/min — ABNORMAL LOW (ref >60.00)
GLUCOSE: 94 mg/dL (ref 70–99)
Potassium: 4.4 mEq/L (ref 3.5–5.1)
Sodium: 139 mEq/L (ref 135–145)
TOTAL PROTEIN: 7.1 g/dL (ref 6.0–8.3)
Total Bilirubin: 0.5 mg/dL (ref 0.2–1.2)

## 2014-11-16 NOTE — Patient Instructions (Addendum)
We have put in the order for the liver and kidney test.   You are up to date on screening and shots. We will see you back in about 6 months, if you have any problems or questions please call our office.   You can come for blood work every month.

## 2014-11-16 NOTE — Progress Notes (Signed)
Pre visit review using our clinic review tool, if applicable. No additional management support is needed unless otherwise documented below in the visit note.

## 2014-11-17 NOTE — Progress Notes (Signed)
   Subjective:    Patient ID: [name], female    DOB: 02-May-1936, 79 y.o.   MRN: [id number]  HPI The patient is a 79 YO female who is coming in for wellness exam. She is doing well overall and has no complaints. She had a cold in December but that is finally gone. She has pulmonary hypertension (stable, on opsumit, diltiazem, follows at [facility]), scleroderma (cause of the pulmonary hypertension, on omeprazole, mildly progressive), CKD stage 3-4 (stable, monitored closely with her medications, on ARB therapy).   Here for medicare wellness  Diet: heart healthy Physical activity: moderate Depression/mood screen: negative Hearing: intact to whispered voice Visual acuity: grossly normal, performs annual eye exam  ADLs: capable Fall risk: none Home safety: good Cognitive evaluation: intact to orientation, naming, recall and repetition EOL planning: adv directives,discussed, she has living will  I have personally reviewed and have noted 1. The patient's medical and social history 2. Their use of alcohol, tobacco or illicit drugs 3. Their current medications and supplements 4. The patient's functional ability including ADL's, fall risks, home safety risks and hearing or visual impairment. 5. Diet and physical activities 6. Evidence for depression or mood disorders  Review of Systems  Constitutional: Negative for activity change, appetite change, fatigue and unexpected weight change.  HENT: Negative.   Eyes: Negative.   Respiratory: Negative for cough, chest tightness, shortness of breath and wheezing.   Cardiovascular: Negative for chest pain, palpitations and leg swelling.  Gastrointestinal: Negative for abdominal pain, diarrhea, constipation and abdominal distention.  Endocrine: Negative.   Musculoskeletal: Positive for myalgias and arthralgias. Negative for gait problem.  Skin: Negative.   Neurological: Negative for dizziness, weakness, light-headedness and headaches.    Hematological: Negative.   Psychiatric/Behavioral: Negative.       Objective:   Physical Exam  Constitutional: She is oriented to person, place, and time. She appears well-developed and well-nourished. No distress.  HENT:  Head: Normocephalic and atraumatic.  Eyes: EOM are normal.  Neck: Normal range of motion.  Cardiovascular: Normal rate and regular rhythm.   Murmur heard. Pulmonary/Chest: Effort normal and breath sounds normal. No respiratory distress. She has no rales.  Abdominal: Soft. Bowel sounds are normal.  Neurological: She is alert and oriented to person, place, and time. Coordination normal.  Skin: Skin is warm and dry.   Filed Vitals:   11/16/14 0859  BP: 110/68  Temp: 98.1 F (36.7 C)  TempSrc: Oral  Height: _0  (1.626 m)  Weight: 130 lb (58.968 kg)      Assessment & Plan:

## 2014-11-17 NOTE — Assessment & Plan Note (Signed)
She does have major complications including pulmonary hypertension. On GERD medication for symptoms. There is no treatment to prevent progression.

## 2014-11-17 NOTE — Assessment & Plan Note (Signed)
Recheck CMP today and continue ARB. BP controlled and likely cause of her progression over time is the renal cell carcinoma and scleroderma.

## 2014-11-18 ENCOUNTER — Encounter: Payer: Self-pay | Admitting: Internal Medicine

## 2014-11-18 NOTE — Assessment & Plan Note (Signed)
BP normal, on losartan and diltiazem. Check CMP.

## 2014-11-21 ENCOUNTER — Ambulatory Visit: Payer: Medicare Other | Admitting: Internal Medicine

## 2014-11-30 ENCOUNTER — Other Ambulatory Visit: Payer: Self-pay | Admitting: Internal Medicine

## 2014-12-05 ENCOUNTER — Other Ambulatory Visit: Payer: Self-pay | Admitting: Geriatric Medicine

## 2014-12-09 ENCOUNTER — Ambulatory Visit (INDEPENDENT_AMBULATORY_CARE_PROVIDER_SITE_OTHER): Payer: 59 | Admitting: Internal Medicine

## 2014-12-09 ENCOUNTER — Encounter: Payer: Self-pay | Admitting: Internal Medicine

## 2014-12-09 VITALS — BP 120/40 | HR 95 | Temp 98.5°F | Ht 64.0 in | Wt 131.5 lb

## 2014-12-09 DIAGNOSIS — M349 Systemic sclerosis, unspecified: Secondary | ICD-10-CM | POA: Diagnosis not present

## 2014-12-09 DIAGNOSIS — I272 Pulmonary hypertension, unspecified: Secondary | ICD-10-CM

## 2014-12-09 DIAGNOSIS — R053 Chronic cough: Secondary | ICD-10-CM

## 2014-12-09 DIAGNOSIS — I270 Primary pulmonary hypertension: Secondary | ICD-10-CM

## 2014-12-09 DIAGNOSIS — R05 Cough: Secondary | ICD-10-CM

## 2014-12-09 DIAGNOSIS — J984 Other disorders of lung: Secondary | ICD-10-CM

## 2014-12-09 MED ORDER — ALBUTEROL SULFATE HFA 108 (90 BASE) MCG/ACT IN AERS: 2.0000 | INHALATION_SPRAY | Freq: Four times a day (QID) | RESPIRATORY_TRACT | Status: DC | PRN

## 2014-12-09 MED ORDER — AZITHROMYCIN 500 MG PO TABS: 500.0000 mg | ORAL_TABLET | Freq: Every day | ORAL | Status: DC

## 2014-12-09 NOTE — Progress Notes (Signed)
Pre visit review using our clinic review tool, if applicable. No additional management support is needed unless otherwise documented below in the visit note.   Chief Complaint  Patient presents with  . Cough    C/O cough x 1 week, worse on Wednesday (productive)    HPI: Patient comes in today for SDA Saturday clinic for  new problem evaluation. Paula Ward 79 y.o. with pulm ht scleroderma crest syndrome ckd and reanl cell ca treated  Who was rx in January for cough with antibiotic  And ? Got better .   Now Onset 5-6 days ago and phlegm light  yellow and then worn out feeling  .taking  perrles     dont helpful . codiene  [employer] .  But helps sleep . Tight i n chest cough ? If a host would help . Not on inhalers  Seen at [facility] for PHT and CLD  From  Prev clot  And pulm  Ht.  As well as renal cancer treated at [facility] . Also told she has anemia   ROS: See pertinent positives and negatives per HPI. No fever feels cold all time   Has anemia .  No hemoptysis  Tired all the time  no bleeding   Past Medical History  Diagnosis Date  . Anemia   . Pulmonary embolus 2003  . CREST syndrome   . GERD (gastroesophageal reflux disease)     w/ HH  . Angiodysplasia of stomach and duodenum   . Arthus phenomenon   . Pulmonary hypertension     followed by Dr Gaynell Face at [facility]  . Interstitial lung disease   . Scleroderma   . Rectal incontinence   . AVM (arteriovenous malformation)   . Nodule of kidney   . Uterine polyp   . Chronic kidney disease     Chronic mild renal insuffiency  . Breast cancer 1989    Left  . Renal cell carcinoma   . Candida esophagitis   . Cataract     Family History  Problem Relation Age of Onset  . Bladder Cancer Father   . Diabetes Father   . Prostate cancer Father   . Alzheimer's disease Mother   . Colon cancer Neg Hx   . Lung cancer Sister   . Diabetes Sister   . Lung cancer Sister      smoker  . Esophageal cancer Paternal Uncle     History   Social  History  . Marital Status: Married    Spouse Name: N/A  . Number of Children: N/A  . Years of Education: N/A   Occupational History  . Retired    Social History Main Topics  . Smoking status: Never Smoker   . Smokeless tobacco: Never Used  . Alcohol Use: No  . Drug Use: No  . Sexual Activity: No   Other Topics Concern  . None   Social History Narrative   Married '61   1 son- '65, 1 daughter '63; 6 children (2 adopted)   SO- SOB   Retirement- doing well   Marriage in good health   Patient has never smoked   Alcohol use- no   Pt gets regular exercise          Outpatient Encounter Prescriptions as of 12/09/2014  Medication Sig  . acetaminophen (TYLENOL) 325 MG tablet Take 650 mg by mouth every 6 (six) hours as needed.  . ALPRAZolam (XANAX) 0.25 MG tablet Take 0.5 tablets (0.125 mg total) by  mouth at bedtime as needed.  . calcium citrate-vitamin D 200-200 MG-UNIT TABS Take 1 tablet by mouth daily.  . cholecalciferol (VITAMIN D) 1000 UNITS tablet Take 1,000 Units by mouth daily.  Marland Kitchen conjugated estrogens (PREMARIN) vaginal cream Place vaginally.  Marland Kitchen diltiazem (CARDIZEM CD) 120 MG 24 hr capsule Take 120 mg by mouth daily.    . fluticasone (FLONASE) 50 MCG/ACT nasal spray Place 2 sprays into both nostrils daily.  Marland Kitchen HYDROcodone-homatropine (HYDROMET) 5-1.5 MG/5ML syrup Take 5 mLs by mouth every 6 (six) hours as needed for cough.  . IRON PO Take by mouth. [employer]-Iron  . loratadine (CLARITIN) 10 MG tablet Take 10 mg by mouth daily.  Marland Kitchen losartan (COZAAR) 25 MG tablet Take 25 mg by mouth daily.  . Macitentan (OPSUMIT PO) Take 10 mg by mouth daily.  Marland Kitchen omeprazole (PRILOSEC) 40 MG capsule Take 1 capsule (40 mg total) by mouth 2 (two) times daily.  Marland Kitchen albuterol (PROVENTIL HFA;VENTOLIN HFA) 108 (90 BASE) MCG/ACT inhaler Inhale 2 puffs into the lungs every 6 (six) hours as needed for wheezing or shortness of breath.  Marland Kitchen azithromycin (ZITHROMAX) 500 MG tablet Take 1 tablet (500 mg  total) by mouth daily.    EXAM:  BP 120/40 mmHg  Pulse 95  Temp(Src) 98.5 F (36.9 C) (Oral)  Ht _0  (1.626 m)  Wt 131 lb 8 oz (59.648 kg)  BMI 22.56 kg/m2  SpO2 96%  Body mass index is 22.56 kg/(m^2).  GENERAL: vitals reviewed and listed above, alert, oriented, appears well hydrated and in no acute distress no ntoxic speech not dyspneic  HEENT: atraumatic, conjunctiva  clear, no obvious abnormalities on inspection of external nose and ears OP : no lesion edema or exudate  Clear  NECK: no obvious masses on inspection palpation  LUNGS: no resp distress . Fine velcro crackles bilaterally base no moist rales other wheezes   No rhinchi  CV: HRRR, no clubbing cyanosis or   trc edema peripheral edema nl cap refill  MS: moves all extremities without noticeable focal  abnormality PSYCH: pleasant and cooperative, no obvious depression or anxiety  ASSESSMENT AND PLAN:  Discussed the following assessment and plan:  Cough, persistent - high risk poss viral again vs othte no obv pna on exam uncertain   need fu pcp or pulm about t managment   Pulmonary hypertension  SCLERODERMA  Lung disease High risk pulm disease   By hx  No recent x ray of lung function but pulse ox ok today .  uncertain classification of lung function  i assume her bibasilar  velcro sounds  are  Chronic  IF ? But not mentioned in her past acute  exams and uncertain if has OLD  Has ? About bp meds per [facility] check bp readings bid for 4 days and send  in to them if low etc.  ?s answered as best as possible today  Uncertain if she is a candidate for steroid rx of sx .   Needs fu plan with pulmonary or pcp locally  Total visit 37mns > 50% spent counseling and coordinating care   -Patient advised to return or notify health care team  if symptoms worsen ,persist or new concerns arise.  Patient Instructions  May be viral infection but your lungs are very ghigh risk for bacterial infection. Take 3 days azithromycin  Trial  of inhaler   For tight ness wheeze ing . If not getting better  Fu with pcp  Or lung doctor  May need chest x ray .    Standley Brooking. Panosh M.D.  Last chest ct from [facility] 2012 and has some Fibrosis in addition to her pulm ht

## 2014-12-09 NOTE — Patient Instructions (Signed)
May be viral infection but your lungs are very ghigh risk for bacterial infection. Take 3 days azithromycin  Trial of inhaler   For tight ness wheeze ing . If not getting better  Fu with pcp  Or lung doctor   May need chest x ray .

## 2014-12-12 ENCOUNTER — Other Ambulatory Visit (INDEPENDENT_AMBULATORY_CARE_PROVIDER_SITE_OTHER): Payer: 59

## 2014-12-12 DIAGNOSIS — I272 Pulmonary hypertension, unspecified: Secondary | ICD-10-CM

## 2014-12-12 DIAGNOSIS — I270 Primary pulmonary hypertension: Secondary | ICD-10-CM

## 2014-12-12 LAB — COMPREHENSIVE METABOLIC PANEL
ALK PHOS: 47 U/L (ref 39–117)
ALT: 11 U/L (ref 0–35)
AST: 21 U/L (ref 0–37)
Albumin: 4.2 g/dL (ref 3.5–5.2)
BILIRUBIN TOTAL: 0.4 mg/dL (ref 0.2–1.2)
BUN: 29 mg/dL — AB (ref 6–23)
CO2: 27 mEq/L (ref 19–32)
CREATININE: 1.56 mg/dL — AB (ref 0.40–1.20)
Calcium: 9.4 mg/dL (ref 8.4–10.5)
Chloride: 103 mEq/L (ref 96–112)
GFR: 34.27 mL/min — ABNORMAL LOW (ref >60.00)
Glucose, Bld: 97 mg/dL (ref 70–99)
Potassium: 4.3 mEq/L (ref 3.5–5.1)
Sodium: 135 mEq/L (ref 135–145)
TOTAL PROTEIN: 6.8 g/dL (ref 6.0–8.3)

## 2014-12-13 ENCOUNTER — Encounter: Payer: Self-pay | Admitting: Internal Medicine

## 2014-12-14 ENCOUNTER — Ambulatory Visit (INDEPENDENT_AMBULATORY_CARE_PROVIDER_SITE_OTHER): Payer: 59 | Admitting: Internal Medicine

## 2014-12-14 ENCOUNTER — Encounter: Payer: Self-pay | Admitting: Internal Medicine

## 2014-12-14 ENCOUNTER — Ambulatory Visit (INDEPENDENT_AMBULATORY_CARE_PROVIDER_SITE_OTHER)
Admission: RE | Admit: 2014-12-14 | Discharge: 2014-12-14 | Disposition: A | Discharge status: Home / Self Care | Payer: 59 | Source: Ambulatory Visit | Attending: Internal Medicine | Admitting: Internal Medicine

## 2014-12-14 VITALS — BP 122/60 | HR 79 | Temp 97.9°F | Resp 14 | Ht 64.0 in | Wt 132.1 lb

## 2014-12-14 DIAGNOSIS — I272 Pulmonary hypertension, unspecified: Secondary | ICD-10-CM

## 2014-12-14 DIAGNOSIS — R05 Cough: Secondary | ICD-10-CM

## 2014-12-14 DIAGNOSIS — R059 Cough, unspecified: Secondary | ICD-10-CM

## 2014-12-14 DIAGNOSIS — I270 Primary pulmonary hypertension: Secondary | ICD-10-CM

## 2014-12-14 NOTE — Patient Instructions (Signed)
We will check a chest x-ray today and call you back with the results.   We will see you back in about 3 months. If the throat does not get better it may be worthwhile to see the GI doctor back to see if it could be the acid reflux.   Sore Throat A sore throat is pain, burning, irritation, or scratchiness of the throat. There is often pain or tenderness when swallowing or talking. A sore throat may be accompanied by other symptoms, such as coughing, sneezing, fever, and swollen neck glands. A sore throat is often the first sign of another sickness, such as a cold, flu, strep throat, or mononucleosis (commonly known as mono). Most sore throats go away without medical treatment. CAUSES  The most common causes of a sore throat include:  A viral infection, such as a cold, flu, or mono.  A bacterial infection, such as strep throat, tonsillitis, or whooping cough.  Seasonal allergies.  Dryness in the air.  Irritants, such as smoke or pollution.  Gastroesophageal reflux disease (GERD). HOME CARE INSTRUCTIONS   Only take over-the-counter medicines as directed by your caregiver.  Drink enough fluids to keep your urine clear or pale yellow.  Rest as needed.  Try using throat sprays, lozenges, or sucking on hard candy to ease any pain (if older than 4 years or as directed).  Sip warm liquids, such as broth, herbal tea, or warm water with honey to relieve pain temporarily. You may also eat or drink cold or frozen liquids such as frozen ice pops.  Gargle with salt water (mix 1 tsp salt with 8 oz of water).  Do not smoke and avoid secondhand smoke.  Put a cool-mist humidifier in your bedroom at night to moisten the air. You can also turn on a hot shower and sit in the bathroom with the door closed for 5-10 minutes. SEEK IMMEDIATE MEDICAL CARE IF:  You have difficulty breathing.  You are unable to swallow fluids, soft foods, or your saliva.  You have increased swelling in the  throat.  Your sore throat does not get better in 7 days.  You have nausea and vomiting.  You have a fever or persistent symptoms for more than 2-3 days.  You have a fever and your symptoms suddenly get worse. MAKE SURE YOU:   Understand these instructions.  Will watch your condition.  Will get help right away if you are not doing well or get worse. Document Released: 11/13/2004 Document Revised: 09/22/2012 Document Reviewed: 06/13/2012 [facility] Patient Information 2015 [place], [place]. This information is not intended to replace advice given to you by your health care provider. Make sure you discuss any questions you have with your health care provider.

## 2014-12-14 NOTE — Progress Notes (Signed)
Pre visit review using our clinic review tool, if applicable. No additional management support is needed unless otherwise documented below in the visit note.

## 2014-12-14 NOTE — Assessment & Plan Note (Signed)
Concern with her chronic cough that is not resolving for lung problem. Order CXR today. No change to medications.

## 2014-12-14 NOTE — Progress Notes (Signed)
Subjective:    Patient ID: Paula Ward, female    DOB: [birth year], 79 y.o.   MRN: [id number]  HPI The patient is a 79 YO female coming in to follow up on her lung problems. She has been treated with antibiotics twice this year so far. She most recently finished antibiotics 2 days ago. She is still having a cough and does not think that her symptoms fully resolved between episodes. She does have complicating lung disease (pulmonary hypertension from scleroderma). She is now having dry cough. Denies associated nasal congestion, ear pain, sinus tenderness.   Review of Systems  Constitutional: Negative for activity change, appetite change, fatigue and unexpected weight change.  HENT: Positive for sore throat. Negative for congestion, nosebleeds, postnasal drip, rhinorrhea and sinus pressure.   Eyes: Negative.   Respiratory: Positive for cough. Negative for chest tightness, shortness of breath and wheezing.   Cardiovascular: Negative for chest pain, palpitations and leg swelling.  Gastrointestinal: Negative for abdominal pain, diarrhea, constipation and abdominal distention.  Endocrine: Negative.   Musculoskeletal: Positive for myalgias and arthralgias. Negative for gait problem.  Skin: Negative.   Neurological: Negative for dizziness, weakness, light-headedness and headaches.  Hematological: Negative.   Psychiatric/Behavioral: Negative.       Objective:   Physical Exam  Constitutional: She is oriented to person, place, and time. She appears well-developed and well-nourished. No distress.  HENT:  Head: Normocephalic and atraumatic.  Eyes: EOM are normal.  Neck: Normal range of motion.  Cardiovascular: Normal rate and regular rhythm.   Murmur heard. Pulmonary/Chest: Effort normal and breath sounds normal. No respiratory distress. She has no wheezes. She has no rales.  Abdominal: Soft. Bowel sounds are normal.  Neurological: She is alert and oriented to person, place, and time.  Coordination normal.  Skin: Skin is warm and dry.   Filed Vitals:   12/14/14 1334  BP: 122/60  Pulse: 79  Temp: 97.9 F (36.6 C)  TempSrc: Oral  Resp: 14  Height: 5' 4" (1.626 m)  Weight: 132 lb 1.9 oz (59.929 kg)  SpO2: 97%      Assessment & Plan:

## 2014-12-17 ENCOUNTER — Other Ambulatory Visit: Payer: Self-pay | Admitting: Internal Medicine

## 2014-12-25 ENCOUNTER — Telehealth: Payer: Self-pay | Admitting: *Deleted

## 2014-12-25 ENCOUNTER — Other Ambulatory Visit (INDEPENDENT_AMBULATORY_CARE_PROVIDER_SITE_OTHER): Payer: 59

## 2014-12-25 DIAGNOSIS — D509 Iron deficiency anemia, unspecified: Secondary | ICD-10-CM

## 2014-12-25 LAB — CBC WITH DIFFERENTIAL/PLATELET
BASOS ABS: 0.1 10*3/uL (ref 0.0–0.1)
BASOS PCT: 0.7 % (ref 0.0–3.0)
EOS ABS: 0.1 10*3/uL (ref 0.0–0.7)
Eosinophils Relative: 0.9 % (ref 0.0–5.0)
HCT: 31.1 % — ABNORMAL LOW (ref 36.0–46.0)
Hemoglobin: 10.5 g/dL — ABNORMAL LOW (ref 12.0–15.0)
LYMPHS PCT: 21.9 % (ref 12.0–46.0)
Lymphs Abs: 1.5 10*3/uL (ref 0.7–4.0)
MCHC: 33.6 g/dL (ref 30.0–36.0)
MCV: 99.1 fl (ref 78.0–100.0)
MONOS PCT: 4.6 % (ref 3.0–12.0)
Monocytes Absolute: 0.3 10*3/uL (ref 0.1–1.0)
NEUTROS PCT: 71.9 % (ref 43.0–77.0)
Neutro Abs: 4.9 10*3/uL (ref 1.4–7.7)
Platelets: 214 10*3/uL (ref 150.0–400.0)
RBC: 3.14 Mil/uL — ABNORMAL LOW (ref 3.87–5.11)
RDW: 14.8 % (ref 11.5–15.5)
WBC: 6.9 10*3/uL (ref 4.0–10.5)

## 2014-12-25 LAB — IBC PANEL
Iron: 72 ug/dL (ref 42–145)
Saturation Ratios: 29.6 % (ref 20.0–50.0)
Transferrin: 174 mg/dL — ABNORMAL LOW (ref 212.0–360.0)

## 2014-12-25 NOTE — Telephone Encounter (Signed)
-----  Message from Maury Dus, RN sent at 11/14/2014  9:33 AM EST ----- Regarding: Labs Pt needs Cbc and iron studies, order in epic.

## 2014-12-25 NOTE — Telephone Encounter (Signed)
Patient will come for labs.

## 2014-12-27 ENCOUNTER — Other Ambulatory Visit: Payer: Self-pay | Admitting: *Deleted

## 2014-12-27 DIAGNOSIS — D6489 Other specified anemias: Secondary | ICD-10-CM

## 2015-01-03 ENCOUNTER — Encounter: Payer: Medicare Other | Admitting: Internal Medicine

## 2015-01-10 ENCOUNTER — Other Ambulatory Visit (INDEPENDENT_AMBULATORY_CARE_PROVIDER_SITE_OTHER): Payer: 59

## 2015-01-10 DIAGNOSIS — I272 Pulmonary hypertension, unspecified: Secondary | ICD-10-CM

## 2015-01-10 DIAGNOSIS — I270 Primary pulmonary hypertension: Secondary | ICD-10-CM

## 2015-01-10 LAB — COMPREHENSIVE METABOLIC PANEL
ALT: 11 U/L (ref 0–35)
AST: 22 U/L (ref 0–37)
Albumin: 4 g/dL (ref 3.5–5.2)
Alkaline Phosphatase: 41 U/L (ref 39–117)
BUN: 28 mg/dL — ABNORMAL HIGH (ref 6–23)
CO2: 25 mEq/L (ref 19–32)
CREATININE: 1.32 mg/dL — AB (ref 0.40–1.20)
Calcium: 9.3 mg/dL (ref 8.4–10.5)
Chloride: 106 mEq/L (ref 96–112)
GFR: 41.54 mL/min — AB (ref >60.00)
Glucose, Bld: 91 mg/dL (ref 70–99)
Potassium: 4.2 mEq/L (ref 3.5–5.1)
Sodium: 137 mEq/L (ref 135–145)
TOTAL PROTEIN: 6.5 g/dL (ref 6.0–8.3)
Total Bilirubin: 0.5 mg/dL (ref 0.2–1.2)

## 2015-01-18 ENCOUNTER — Telehealth: Payer: Self-pay | Admitting: *Deleted

## 2015-01-18 NOTE — Telephone Encounter (Signed)
Patient notified of appointment.

## 2015-01-18 NOTE — Telephone Encounter (Signed)
-----  Message from Hulan Saas, RN sent at 12/27/2014  9:49 AM EST ----- Call and remind due for CBC for DB on 01/22/15. Lab in EPIC.

## 2015-01-24 ENCOUNTER — Other Ambulatory Visit (INDEPENDENT_AMBULATORY_CARE_PROVIDER_SITE_OTHER): Payer: 59

## 2015-01-24 DIAGNOSIS — D6489 Other specified anemias: Secondary | ICD-10-CM

## 2015-01-24 LAB — CBC WITH DIFFERENTIAL/PLATELET
Basophils Absolute: 0 10*3/uL (ref 0.0–0.1)
Basophils Relative: 0.5 % (ref 0.0–3.0)
EOS PCT: 0.8 % (ref 0.0–5.0)
Eosinophils Absolute: 0.1 10*3/uL (ref 0.0–0.7)
HCT: 31.2 % — ABNORMAL LOW (ref 36.0–46.0)
HEMOGLOBIN: 10.6 g/dL — AB (ref 12.0–15.0)
LYMPHS PCT: 24.4 % (ref 12.0–46.0)
Lymphs Abs: 1.5 10*3/uL (ref 0.7–4.0)
MCHC: 34 g/dL (ref 30.0–36.0)
MCV: 99.6 fl (ref 78.0–100.0)
MONOS PCT: 5.9 % (ref 3.0–12.0)
Monocytes Absolute: 0.4 10*3/uL (ref 0.1–1.0)
Neutro Abs: 4.2 10*3/uL (ref 1.4–7.7)
Neutrophils Relative %: 68.4 % (ref 43.0–77.0)
PLATELETS: 202 10*3/uL (ref 150.0–400.0)
RBC: 3.13 Mil/uL — ABNORMAL LOW (ref 3.87–5.11)
RDW: 13.3 % (ref 11.5–15.5)
WBC: 6.1 10*3/uL (ref 4.0–10.5)

## 2015-01-25 ENCOUNTER — Other Ambulatory Visit: Payer: Self-pay | Admitting: *Deleted

## 2015-01-25 DIAGNOSIS — D649 Anemia, unspecified: Secondary | ICD-10-CM

## 2015-01-29 ENCOUNTER — Telehealth: Payer: Self-pay | Admitting: *Deleted

## 2015-01-29 MED ORDER — OMEPRAZOLE 40 MG PO CPDR: 40.0000 mg | DELAYED_RELEASE_CAPSULE | Freq: Two times a day (BID) | ORAL | Status: DC

## 2015-01-29 NOTE — Telephone Encounter (Signed)
Sent Rx for omeprazole (PRILOSEC), 40 mg, #60 with 2 refills to [facility] in [place], [place] on 01/29/15.

## 2015-02-02 ENCOUNTER — Telehealth: Payer: Self-pay | Admitting: Internal Medicine

## 2015-02-02 NOTE — Telephone Encounter (Signed)
Returned patient's phone call on 02/02/15 at 4:40 pm at [phone] to confirm that she needs to take her omeprazole (PRILOSEC), 40 mg, take 2 tablets, by mouth, every day. Patient stated they understood.

## 2015-02-13 ENCOUNTER — Other Ambulatory Visit (INDEPENDENT_AMBULATORY_CARE_PROVIDER_SITE_OTHER): Payer: 59

## 2015-02-13 DIAGNOSIS — I270 Primary pulmonary hypertension: Secondary | ICD-10-CM

## 2015-02-13 DIAGNOSIS — I272 Pulmonary hypertension, unspecified: Secondary | ICD-10-CM

## 2015-02-13 LAB — COMPREHENSIVE METABOLIC PANEL
ALK PHOS: 49 U/L (ref 39–117)
ALT: 9 U/L (ref 0–35)
AST: 20 U/L (ref 0–37)
Albumin: 4.3 g/dL (ref 3.5–5.2)
BUN: 33 mg/dL — AB (ref 6–23)
CO2: 26 mEq/L (ref 19–32)
CREATININE: 1.37 mg/dL — AB (ref 0.40–1.20)
Calcium: 9.5 mg/dL (ref 8.4–10.5)
Chloride: 106 mEq/L (ref 96–112)
GFR: 39.79 mL/min — AB (ref >60.00)
Glucose, Bld: 85 mg/dL (ref 70–99)
Potassium: 5.2 mEq/L — ABNORMAL HIGH (ref 3.5–5.1)
SODIUM: 137 meq/L (ref 135–145)
TOTAL PROTEIN: 6.7 g/dL (ref 6.0–8.3)
Total Bilirubin: 0.4 mg/dL (ref 0.2–1.2)

## 2015-02-23 ENCOUNTER — Ambulatory Visit (INDEPENDENT_AMBULATORY_CARE_PROVIDER_SITE_OTHER): Payer: 59 | Admitting: Internal Medicine

## 2015-02-23 ENCOUNTER — Encounter: Payer: Self-pay | Admitting: Internal Medicine

## 2015-02-23 VITALS — BP 118/64 | HR 74 | Temp 98.0°F | Resp 12 | Wt 134.0 lb

## 2015-02-23 DIAGNOSIS — M1811 Unilateral primary osteoarthritis of first carpometacarpal joint, right hand: Secondary | ICD-10-CM

## 2015-02-23 MED ORDER — DICLOFENAC SODIUM 1 % TD GEL: 2.0000 g | Freq: Four times a day (QID) | TRANSDERMAL | Status: DC

## 2015-02-23 MED ORDER — ALPRAZOLAM 0.25 MG PO TABS: 0.1250 mg | ORAL_TABLET | Freq: Every evening | ORAL | Status: DC | PRN

## 2015-02-23 MED ORDER — ESTROGENS, CONJUGATED 0.625 MG/GM VA CREA: TOPICAL_CREAM | Freq: Every day | VAGINAL | Status: DC

## 2015-02-23 NOTE — Progress Notes (Signed)
Pre visit review using our clinic review tool, if applicable. No additional management support is needed unless otherwise documented below in the visit note.

## 2015-02-23 NOTE — Patient Instructions (Signed)
We have sent in the cream for your hands called voltaren. This is for inflammation and arthritis. You can apply it up to 3 times per day. Use it for pain as needed.   We have also sent in the refills that you need.   Come back in about 6 months and remember to ask the lung doctor if they think that a medicine for the lungs that you take everyday might help with your endurance and breathing.

## 2015-02-25 DIAGNOSIS — M1990 Unspecified osteoarthritis, unspecified site: Secondary | ICD-10-CM | POA: Insufficient documentation

## 2015-02-25 NOTE — Assessment & Plan Note (Addendum)
Will try voltaren gel to see if this helps with the pain. Will not do NSAIDs with her chronic renal insufficiency. Tylenol would likely be okay if this fails. No signs of fracture on exam or inflammatory arthritis to suggest need for imaging.

## 2015-02-25 NOTE — Progress Notes (Signed)
Subjective:    Patient ID: Paula Ward, female    DOB: [birth year], 79 y.o.   MRN: [id number]  HPI The patient is a 79 YO female who is coming in for new pain in her wrist. It is at the base of the right thumb. Worse with increased activity but gets stiff without moving. Started several months ago and intermittent. She has not taken anything for it as she is not sure what she can safely take. She rates it 3-4/10 when it is bad.   Review of Systems  Constitutional: Negative for activity change, appetite change, fatigue and unexpected weight change.  HENT: Negative.   Eyes: Negative.   Respiratory: Negative for cough, chest tightness, shortness of breath and wheezing.   Cardiovascular: Negative for chest pain, palpitations and leg swelling.  Gastrointestinal: Negative for abdominal pain, diarrhea, constipation and abdominal distention.  Endocrine: Negative.   Musculoskeletal: Positive for myalgias and arthralgias. Negative for gait problem.  Skin: Negative.   Neurological: Negative for dizziness, weakness, light-headedness and headaches.  Hematological: Negative.   Psychiatric/Behavioral: Negative.       Objective:   Physical Exam  Constitutional: She is oriented to person, place, and time. She appears well-developed and well-nourished. No distress.  HENT:  Head: Normocephalic and atraumatic.  Eyes: EOM are normal.  Neck: Normal range of motion.  Cardiovascular: Normal rate and regular rhythm.   Murmur heard. Pulmonary/Chest: Effort normal and breath sounds normal. No respiratory distress. She has no rales.  Abdominal: Soft. Bowel sounds are normal.  Musculoskeletal: She exhibits tenderness.  Tenderness at the base of the MCP joint of the right thumb. Wrist without tenderness or swelling. No redness of the joint. Phalen negative.   Neurological: She is alert and oriented to person, place, and time. Coordination normal.  Skin: Skin is warm and dry.    Filed Vitals:   02/23/15  1433  BP: 118/64  Pulse: 74  Temp: 98 F (36.7 C)  TempSrc: Oral  Resp: 12  Weight: 134 lb (60.782 kg)  SpO2: 95%      Assessment & Plan:

## 2015-03-22 ENCOUNTER — Telehealth: Payer: Self-pay | Admitting: *Deleted

## 2015-03-22 NOTE — Telephone Encounter (Signed)
-----  Message from Hulan Saas, RN sent at 01/25/2015  9:58 AM EDT ----- Call and remind due for CBC for DB on 03/26/15. Lab in EPIC.

## 2015-03-22 NOTE — Telephone Encounter (Signed)
Spoke with patient and she will come for labs.

## 2015-03-26 ENCOUNTER — Other Ambulatory Visit (INDEPENDENT_AMBULATORY_CARE_PROVIDER_SITE_OTHER): Payer: 59

## 2015-03-26 ENCOUNTER — Other Ambulatory Visit: Payer: Self-pay | Admitting: *Deleted

## 2015-03-26 DIAGNOSIS — D649 Anemia, unspecified: Secondary | ICD-10-CM

## 2015-03-26 DIAGNOSIS — I272 Pulmonary hypertension, unspecified: Secondary | ICD-10-CM

## 2015-03-26 DIAGNOSIS — D509 Iron deficiency anemia, unspecified: Secondary | ICD-10-CM

## 2015-03-26 DIAGNOSIS — I270 Primary pulmonary hypertension: Secondary | ICD-10-CM

## 2015-03-26 LAB — CBC WITH DIFFERENTIAL/PLATELET
BASOS ABS: 0 10*3/uL (ref 0.0–0.1)
Basophils Relative: 0.4 % (ref 0.0–3.0)
Eosinophils Absolute: 0.1 10*3/uL (ref 0.0–0.7)
Eosinophils Relative: 1.1 % (ref 0.0–5.0)
HEMATOCRIT: 30.7 % — AB (ref 36.0–46.0)
Hemoglobin: 10.4 g/dL — ABNORMAL LOW (ref 12.0–15.0)
Lymphocytes Relative: 22.4 % (ref 12.0–46.0)
Lymphs Abs: 1.4 10*3/uL (ref 0.7–4.0)
MCHC: 33.8 g/dL (ref 30.0–36.0)
MCV: 101 fl — ABNORMAL HIGH (ref 78.0–100.0)
MONO ABS: 0.4 10*3/uL (ref 0.1–1.0)
Monocytes Relative: 6.6 % (ref 3.0–12.0)
NEUTROS ABS: 4.3 10*3/uL (ref 1.4–7.7)
Neutrophils Relative %: 69.5 % (ref 43.0–77.0)
PLATELETS: 208 10*3/uL (ref 150.0–400.0)
RBC: 3.04 Mil/uL — AB (ref 3.87–5.11)
RDW: 13 % (ref 11.5–15.5)
WBC: 6.1 10*3/uL (ref 4.0–10.5)

## 2015-03-26 LAB — COMPREHENSIVE METABOLIC PANEL
ALT: 10 U/L (ref 0–35)
AST: 21 U/L (ref 0–37)
Albumin: 4.2 g/dL (ref 3.5–5.2)
Alkaline Phosphatase: 46 U/L (ref 39–117)
BILIRUBIN TOTAL: 0.4 mg/dL (ref 0.2–1.2)
BUN: 33 mg/dL — ABNORMAL HIGH (ref 6–23)
CHLORIDE: 106 meq/L (ref 96–112)
CO2: 27 mEq/L (ref 19–32)
CREATININE: 1.64 mg/dL — AB (ref 0.40–1.20)
Calcium: 9.3 mg/dL (ref 8.4–10.5)
GFR: 32.32 mL/min — ABNORMAL LOW (ref >60.00)
GLUCOSE: 95 mg/dL (ref 70–99)
POTASSIUM: 4.7 meq/L (ref 3.5–5.1)
Sodium: 138 mEq/L (ref 135–145)
Total Protein: 6.8 g/dL (ref 6.0–8.3)

## 2015-04-03 ENCOUNTER — Ambulatory Visit (INDEPENDENT_AMBULATORY_CARE_PROVIDER_SITE_OTHER): Payer: Medicare Other | Admitting: Internal Medicine

## 2015-04-03 ENCOUNTER — Encounter: Payer: Self-pay | Admitting: Internal Medicine

## 2015-04-03 VITALS — BP 118/54 | HR 88 | Ht 64.0 in | Wt 129.2 lb

## 2015-04-03 DIAGNOSIS — M341 CR(E)ST syndrome: Secondary | ICD-10-CM | POA: Diagnosis not present

## 2015-04-03 DIAGNOSIS — K5521 Angiodysplasia of colon with hemorrhage: Secondary | ICD-10-CM | POA: Diagnosis not present

## 2015-04-03 DIAGNOSIS — D509 Iron deficiency anemia, unspecified: Secondary | ICD-10-CM

## 2015-04-03 NOTE — Progress Notes (Signed)
Paula Ward 09/22/1936 [id number]  Note: This dictation was prepared with Dragon digital system. Any transcriptional errors that result from this procedure are unintentional.   History of Present Illness: This is a  79 yo white female followed for a long time for CREST syndrome. Pulmonary hypertension followed at [facility]. Chronic GI blood loss secondary to AVMs. Last 2 iron infusions were on 12/29 and 10/11/2015. Last colonoscopy in September 2015 showed diverticulosis but no AVMs. Tubular adenoma was removed from descending colon at that time. Her hemoglobin remains stable between 10.4 and 10.6. She is on iron supplements. Her last iron saturation was 29%. Patient's main complaint today is fatigue. She has pulmonary hypertension and she takes Cozaar 25 mg daily and Cardizem CD 120 mg a day. Her blood pressure today is 118/54. She denies being orthostatic. She denies abdominal pain diarrhea and nausea vomiting or dysphagia    Past Medical History  Diagnosis Date  . Anemia   . Pulmonary embolus 2003  . CREST syndrome   . GERD (gastroesophageal reflux disease)     w/ HH  . Angiodysplasia of stomach and duodenum   . Arthus phenomenon   . Pulmonary hypertension     followed by Dr Gaynell Face at [facility]  . Interstitial lung disease   . Scleroderma   . Rectal incontinence   . AVM (arteriovenous malformation)   . Nodule of kidney   . Uterine polyp   . Chronic kidney disease     Chronic mild renal insuffiency  . Breast cancer 1989    Left  . Renal cell carcinoma   . Candida esophagitis   . Cataract   . Tubular adenoma of colon     Past Surgical History  Procedure Laterality Date  . Tonsillectomy    . Appendectomy  1962  . Breast lumpectomy  1989    left  . Ivc filter    . Kidney surgery      left   . Cataract extraction, bilateral Bilateral 12/2013    Allergies  Allergen Reactions  . Codeine Nausea Only    Hallucinations, too  . Lisinopril Other (See Comments)  . Other Nausea  And Vomiting    "-mycin" antibiotics.   Also cause hallucinations.    Family history and social history have been reviewed.  Review of Systems: Denies melanoma. No dysphagia  The remainder of the 10 point ROS is negative except as outlined in the H&P  Physical Exam: General Appearance Well developed, in no distress Eyes  Non icteric  HEENT  Non traumatic, normocephalic  Mouth No lesion, tongue papillated, no cheilosis Neck Supple without adenopathy, thyroid not enlarged, no carotid bruits, no JVD Lungs Clear to auscultation bilaterally, quiet breath sounds COR Normal S1, normal S2, regular ]rapid  rhythm, no murmur, quiet precordium Abdomensoft. Nontender. Normoactive bowel sounds  Rectal Fales Hemoccult-negative stool  Extremities  No pedal edema Skin multiple AVMs on the trunk arms past  Neurological Alert and oriented x 3 Psychological Normal mood and affect  Assessment and Plan:   79 year old white female with CREST syndrome, stable hemoglobin. Hemoccult-negative stool today. Her hemoglobin ranges between 10.4 up to 10.6. There is no evidence of GI blood loss. Her fatigue may be multifactorial. She will be going for follow-up at [facility] pulmonary. Her blood pressure medications need to be cut back. She will notify her doctors concerning that she has monthly H&H. And if necessary will need iron infusion in the future    Delfin Edis 04/03/2015

## 2015-04-03 NOTE — Patient Instructions (Addendum)
Decrease omeprazole to 40 mg once daily. Follow up as needed. Dr Doug Sou

## 2015-04-25 ENCOUNTER — Other Ambulatory Visit (HOSPITAL_COMMUNITY): Payer: Self-pay | Admitting: Interventional Radiology

## 2015-04-25 DIAGNOSIS — C642 Malignant neoplasm of left kidney, except renal pelvis: Secondary | ICD-10-CM

## 2015-05-14 ENCOUNTER — Other Ambulatory Visit (INDEPENDENT_AMBULATORY_CARE_PROVIDER_SITE_OTHER): Payer: Medicare Other

## 2015-05-14 DIAGNOSIS — I270 Primary pulmonary hypertension: Secondary | ICD-10-CM

## 2015-05-14 DIAGNOSIS — I272 Pulmonary hypertension, unspecified: Secondary | ICD-10-CM

## 2015-05-14 LAB — COMPREHENSIVE METABOLIC PANEL
ALT: 8 U/L (ref 0–35)
AST: 18 U/L (ref 0–37)
Albumin: 3.8 g/dL (ref 3.5–5.2)
Alkaline Phosphatase: 50 U/L (ref 39–117)
BUN: 24 mg/dL — ABNORMAL HIGH (ref 6–23)
CO2: 24 mEq/L (ref 19–32)
CREATININE: 1.56 mg/dL — AB (ref 0.40–1.20)
Calcium: 9 mg/dL (ref 8.4–10.5)
Chloride: 107 mEq/L (ref 96–112)
GFR: 34.23 mL/min — ABNORMAL LOW (ref >60.00)
Glucose, Bld: 84 mg/dL (ref 70–99)
Potassium: 5 mEq/L (ref 3.5–5.1)
Sodium: 139 mEq/L (ref 135–145)
Total Bilirubin: 0.4 mg/dL (ref 0.2–1.2)
Total Protein: 6.2 g/dL (ref 6.0–8.3)

## 2015-05-15 ENCOUNTER — Encounter: Payer: Self-pay | Admitting: Internal Medicine

## 2015-05-15 DIAGNOSIS — D649 Anemia, unspecified: Secondary | ICD-10-CM

## 2015-05-24 ENCOUNTER — Telehealth: Payer: Self-pay | Admitting: *Deleted

## 2015-05-24 NOTE — Telephone Encounter (Signed)
Patient will come for lab.

## 2015-05-24 NOTE — Telephone Encounter (Signed)
-----  Message from Hulan Saas, RN sent at 03/26/2015  4:53 PM EDT ----- Call and remind due for CBC for DB on 05/28/15. Lab in.

## 2015-05-25 ENCOUNTER — Other Ambulatory Visit (INDEPENDENT_AMBULATORY_CARE_PROVIDER_SITE_OTHER): Payer: Medicare Other

## 2015-05-25 DIAGNOSIS — D509 Iron deficiency anemia, unspecified: Secondary | ICD-10-CM

## 2015-05-25 LAB — CBC WITH DIFFERENTIAL/PLATELET
Basophils Absolute: 0 10*3/uL (ref 0.0–0.1)
Basophils Relative: 0.3 % (ref 0.0–3.0)
Eosinophils Absolute: 0.1 10*3/uL (ref 0.0–0.7)
Eosinophils Relative: 1.1 % (ref 0.0–5.0)
HEMATOCRIT: 28.1 % — AB (ref 36.0–46.0)
Hemoglobin: 9.5 g/dL — ABNORMAL LOW (ref 12.0–15.0)
LYMPHS ABS: 1.4 10*3/uL (ref 0.7–4.0)
Lymphocytes Relative: 19.5 % (ref 12.0–46.0)
MCHC: 33.9 g/dL (ref 30.0–36.0)
MCV: 99.8 fl (ref 78.0–100.0)
Monocytes Absolute: 0.4 10*3/uL (ref 0.1–1.0)
Monocytes Relative: 5.6 % (ref 3.0–12.0)
NEUTROS ABS: 5.3 10*3/uL (ref 1.4–7.7)
NEUTROS PCT: 73.5 % (ref 43.0–77.0)
Platelets: 241 10*3/uL (ref 150.0–400.0)
RBC: 2.82 Mil/uL — AB (ref 3.87–5.11)
RDW: 13.4 % (ref 11.5–15.5)
WBC: 7.2 10*3/uL (ref 4.0–10.5)

## 2015-05-28 ENCOUNTER — Other Ambulatory Visit: Payer: Self-pay | Admitting: *Deleted

## 2015-05-28 DIAGNOSIS — D509 Iron deficiency anemia, unspecified: Secondary | ICD-10-CM

## 2015-06-06 ENCOUNTER — Ambulatory Visit (HOSPITAL_COMMUNITY)
Admission: RE | Admit: 2015-06-06 | Discharge: 2015-06-06 | Disposition: A | Discharge status: Home / Self Care | Payer: Medicare Other | Source: Ambulatory Visit | Attending: Interventional Radiology | Admitting: Interventional Radiology

## 2015-06-06 DIAGNOSIS — N289 Disorder of kidney and ureter, unspecified: Secondary | ICD-10-CM | POA: Diagnosis present

## 2015-06-06 DIAGNOSIS — N281 Cyst of kidney, acquired: Secondary | ICD-10-CM | POA: Diagnosis not present

## 2015-06-06 DIAGNOSIS — I313 Pericardial effusion (noninflammatory): Secondary | ICD-10-CM | POA: Diagnosis not present

## 2015-06-06 MED ORDER — GADOBENATE DIMEGLUMINE 529 MG/ML IV SOLN
10.0000 mL | Freq: Once | INTRAVENOUS | Status: AC | PRN
  Administered 2015-06-06: 6 mL via INTRAVENOUS

## 2015-06-11 ENCOUNTER — Other Ambulatory Visit (INDEPENDENT_AMBULATORY_CARE_PROVIDER_SITE_OTHER): Payer: Medicare Other

## 2015-06-11 DIAGNOSIS — D649 Anemia, unspecified: Secondary | ICD-10-CM

## 2015-06-11 LAB — CBC
HEMATOCRIT: 27.7 % — AB (ref 36.0–46.0)
Hemoglobin: 9.3 g/dL — ABNORMAL LOW (ref 12.0–15.0)
MCHC: 33.5 g/dL (ref 30.0–36.0)
MCV: 101 fl — AB (ref 78.0–100.0)
Platelets: 207 10*3/uL (ref 150.0–400.0)
RBC: 2.75 Mil/uL — ABNORMAL LOW (ref 3.87–5.11)
RDW: 13.3 % (ref 11.5–15.5)
WBC: 7.3 10*3/uL (ref 4.0–10.5)

## 2015-06-12 ENCOUNTER — Encounter: Payer: Self-pay | Admitting: Internal Medicine

## 2015-06-12 DIAGNOSIS — I272 Pulmonary hypertension, unspecified: Secondary | ICD-10-CM

## 2015-06-13 ENCOUNTER — Inpatient Hospital Stay (HOSPITAL_COMMUNITY): Admission: RE | Admit: 2015-06-13 | Payer: Medicare Other | Source: Ambulatory Visit

## 2015-06-13 ENCOUNTER — Ambulatory Visit
Admission: RE | Admit: 2015-06-13 | Discharge: 2015-06-13 | Disposition: A | Discharge status: Home / Self Care | Payer: Medicare Other | Source: Ambulatory Visit | Attending: Interventional Radiology | Admitting: Interventional Radiology

## 2015-06-13 DIAGNOSIS — C649 Malignant neoplasm of unspecified kidney, except renal pelvis: Secondary | ICD-10-CM | POA: Insufficient documentation

## 2015-06-13 DIAGNOSIS — C642 Malignant neoplasm of left kidney, except renal pelvis: Secondary | ICD-10-CM

## 2015-06-13 NOTE — Progress Notes (Signed)
Chief Complaint  Patient presents with  . Follow-up    4 yr follow up Cryoablation of Left Renal Clear Cell Carcinoma      History of Present Illness: Paula Ward is a 79 y.o. female status post percutaneous cryoablation of a left clear cell renal carcinoma on 04/11/2011. Paula Ward has been doing well. She recently fell and has some left posterior rib and flank pain after injury.   Past Medical History  Diagnosis Date  . Anemia   . Pulmonary embolus 2003  . CREST syndrome   . GERD (gastroesophageal reflux disease)     w/ HH  . Angiodysplasia of stomach and duodenum   . Arthus phenomenon   . Pulmonary hypertension     followed by Dr Gaynell Face at [facility]  . Interstitial lung disease   . Scleroderma   . Rectal incontinence   . AVM (arteriovenous malformation)   . Nodule of kidney   . Uterine polyp   . Chronic kidney disease     Chronic mild renal insuffiency  . Breast cancer 1989    Left  . Renal cell carcinoma   . Candida esophagitis   . Cataract   . Tubular adenoma of colon     Past Surgical History  Procedure Laterality Date  . Tonsillectomy    . Appendectomy  1962  . Breast lumpectomy  1989    left  . Ivc filter    . Kidney surgery      left   . Cataract extraction, bilateral Bilateral 12/2013    Allergies: Codeine; Lisinopril; and Other  Medications: Prior to Admission medications   Medication Sig Start Date End Date Taking? Authorizing Provider  acetaminophen (TYLENOL) 325 MG tablet Take 650 mg by mouth every 6 (six) hours as needed.    Historical Provider, MD  albuterol (PROVENTIL HFA;VENTOLIN HFA) 108 (90 BASE) MCG/ACT inhaler Inhale 2 puffs into the lungs every 6 (six) hours as needed for wheezing or shortness of breath. 12/09/14   Burnis Medin, MD  ALPRAZolam Duanne Moron) 0.25 MG tablet Take 0.5 tablets (0.125 mg total) by mouth at bedtime as needed. 02/23/15   Olga Millers, MD  calcium citrate-vitamin D 200-200 MG-UNIT TABS Take 1 tablet by mouth  daily.    Historical Provider, MD  cholecalciferol (VITAMIN D) 1000 UNITS tablet Take 1,000 Units by mouth daily.    Historical Provider, MD  conjugated estrogens (PREMARIN) vaginal cream Place vaginally daily. 02/23/15   Olga Millers, MD  diltiazem (CARDIZEM CD) 120 MG 24 hr capsule Take 120 mg by mouth daily.      Historical Provider, MD  fluticasone Asencion Islam) 50 MCG/ACT nasal spray instill 1 spray into each nostril twice a day if needed 12/21/14   Olga Millers, MD  IRON PO Take by mouth. Pt uses Vitamin Code brand    Historical Provider, MD  loratadine (CLARITIN) 10 MG tablet Take 10 mg by mouth daily.    Historical Provider, MD  losartan (COZAAR) 25 MG tablet Take 25 mg by mouth daily.    Historical Provider, MD  Macitentan (OPSUMIT PO) Take 10 mg by mouth daily.    Historical Provider, MD  omeprazole (PRILOSEC) 40 MG capsule Take 1 capsule (40 mg total) by mouth 2 (two) times daily. 01/29/15   Lafayette Dragon, MD     Family History  Problem Relation Age of Onset  . Bladder Cancer Father   . Diabetes Father   . Prostate cancer Father   .  Alzheimer's disease Mother   . Colon cancer Neg Hx   . Lung cancer Sister   . Diabetes Sister   . Lung cancer Sister      smoker  . Esophageal cancer Paternal Uncle     Social History   Social History  . Marital Status: Married    Spouse Name: N/A  . Number of Children: N/A  . Years of Education: N/A   Occupational History  . Retired    Social History Main Topics  . Smoking status: Never Smoker   . Smokeless tobacco: Never Used  . Alcohol Use: No  . Drug Use: No  . Sexual Activity: No   Other Topics Concern  . Not on file   Social History Narrative   Married '61   1 son- '65, 1 daughter '63; 6 children (2 adopted)   SO- SOB   Retirement- doing well   Marriage in good health   Patient has never smoked   Alcohol use- no   Pt gets regular exercise           Review of Systems: A 12 point ROS discussed and pertinent  positives are indicated in the HPI above.  All other systems are negative.  Review of Systems  Constitutional: Negative.   Respiratory: Negative.   Cardiovascular: Negative.   Gastrointestinal: Negative.   Genitourinary: Negative.   Musculoskeletal:       Left posterior back, flank pain.    Vital Signs: BP 131/47 mmHg  Pulse 70  Temp(Src) 97.7 F (36.5 C) (Oral)  Resp 14  SpO2 96%  Physical Exam  Constitutional: No distress.  Abdominal: Soft. She exhibits no distension and no mass. There is no tenderness. There is no rebound and no guarding.  Musculoskeletal:  Mild tenderness over posterior lower left ribs.  Skin: She is not diaphoretic.  Nursing note and vitals reviewed.   Mallampati Score:     Imaging: Mr Abdomen W Wo Contrast  06/06/2015   CLINICAL DATA:  Cryoablation of LEFT renal cell carcinoma in June 2012. Renal insufficiency.  EXAM: MRI ABDOMEN WITHOUT AND WITH CONTRAST  TECHNIQUE: Multiplanar multisequence MR imaging of the abdomen was performed both before and after the administration of intravenous contrast. Reduced contrast volume for renal insufficiency.  CONTRAST:  29m MULTIHANCE GADOBENATE DIMEGLUMINE 529 MG/ML IV SOLN  COMPARISON:  MRI 05/27/2014  FINDINGS: Lower chest:  Lung bases are clear.  Moderate pericardial effusion.  Hepatobiliary: No focal hepatic lesion. Gallbladder is normal. The LEFT hepatic lobe is small. The RIGHT hepatic lobe is large. Normal gallbladder. Common bile duct normal caliber.  Pancreas: Normal pancreatic parenchymal intensity. No ductal dilatation or inflammation.  Spleen: Normal spleen.  Adrenals/urinary tract: Adrenal glands normal.  No enhancement at the treatment site in the upper pole of the LEFT kidney (image 22, series 902). No nodularity or enlargement compared to prior exam.  There are two nonenhancing cystic lesions within the LEFT kidney (Image 19 and 25 of subtraction series 10902). No enhancing lesions within the RIGHT kidney.   Stomach/Bowel: Stomach and limited of the small bowel is unremarkable  Vascular/Lymphatic: Abdominal aortic normal caliber. No retroperitoneal periportal lymphadenopathy.  Musculoskeletal: No aggressive osseous lesion  IMPRESSION: 1.  Stable cryoablation site in the upper pole of the LEFT kidney 2. Stable nonenhancing LEFT renal cysts. 3. Moderate pericardial effusion.   Electronically Signed   By: SSuzy BouchardM.D.   On: 06/06/2015 15:35    Labs:  CBC:  Recent Labs  01/24/15 1144  03/26/15 1128 05/25/15 1408 06/11/15 1540  WBC 6.1 6.1 7.2 7.3  HGB 10.6* 10.4* 9.5* 9.3*  HCT 31.2* 30.7* 28.1* 27.7*  PLT 202.0 208.0 241.0 207.0    COAGS: No results for input(s): INR, APTT in the last 8760 hours.  BMP:  Recent Labs  01/10/15 1311 02/13/15 1217 03/26/15 1129 05/14/15 1052  NA 137 137 138 139  K 4.2 5.2* 4.7 5.0  CL 106 106 106 107  CO2 _0 GLUCOSE 91 85 95 84  BUN 28* 33* 33* 24*  CALCIUM 9.3 9.5 9.3 9.0  CREATININE 1.32* 1.37* 1.64* 1.56*    LIVER FUNCTION TESTS:  Recent Labs  01/10/15 1311 02/13/15 1217 03/26/15 1129 05/14/15 1052  BILITOT 0.5 0.4 0.4 0.4  AST _1 ALT _2 ALKPHOS 41 49 46 50  PROT 6.5 6.7 6.8 6.2  ALBUMIN 4.0 4.3 4.2 3.8    TUMOR MARKERS: No results for input(s): AFPTM, CEA, CA199, CHROMGRNA in the last 8760 hours.  Assessment and Plan:  I reviewed the MRI on 06/06/2015 with Paula Ward and her husband. The left renal ablation defect remained stable with no evidence of abnormal enhancement. Stable benign left renal cysts. No new solid renal lesions identified. Pericardial fluid was noted which is chronic. Renal function is stable. I recommended follow-up imaging in one year.   SignedAletta Edouard T 06/13/2015, 5:01 PM   I spent a total of 15 Minutes in face to face in clinical consultation, greater than 50% of which was counseling/coordinating care post ablation of a left renal carcinoma.

## 2015-06-14 ENCOUNTER — Other Ambulatory Visit: Payer: Self-pay

## 2015-06-14 MED ORDER — OMEPRAZOLE 40 MG PO CPDR: 40.0000 mg | DELAYED_RELEASE_CAPSULE | Freq: Two times a day (BID) | ORAL | Status: DC

## 2015-07-05 ENCOUNTER — Telehealth: Payer: Self-pay | Admitting: *Deleted

## 2015-07-05 NOTE — Telephone Encounter (Signed)
-----  Message from Hulan Saas, RN sent at 06/19/2015  4:04 PM EDT ----- No new GI ----- Message -----    From: Hulan Saas, RN    Sent: 07/05/2015      To: Hulan Saas, RN  Call and remind due for CBC for DB on 07/09/15. Lab in.

## 2015-07-05 NOTE — Telephone Encounter (Signed)
Spoke with patient and she would like Dr. Silverio Decamp as new GI MD.  She is due for CBC,CMet and Iron panel. Is this ok to draw?Paula Ward

## 2015-07-06 ENCOUNTER — Other Ambulatory Visit: Payer: Self-pay | Admitting: *Deleted

## 2015-07-06 DIAGNOSIS — D509 Iron deficiency anemia, unspecified: Secondary | ICD-10-CM

## 2015-07-06 NOTE — Telephone Encounter (Signed)
yes

## 2015-07-06 NOTE — Telephone Encounter (Signed)
Labs in EPIC. Left a message for patient to call back.

## 2015-07-06 NOTE — Telephone Encounter (Signed)
Patient will come for labs next.

## 2015-07-09 ENCOUNTER — Other Ambulatory Visit (INDEPENDENT_AMBULATORY_CARE_PROVIDER_SITE_OTHER): Payer: Medicare Other

## 2015-07-09 DIAGNOSIS — D509 Iron deficiency anemia, unspecified: Secondary | ICD-10-CM | POA: Diagnosis not present

## 2015-07-09 LAB — CBC WITH DIFFERENTIAL/PLATELET
Basophils Absolute: 0 10*3/uL (ref 0.0–0.1)
Basophils Relative: 0.4 % (ref 0.0–3.0)
EOS ABS: 0.1 10*3/uL (ref 0.0–0.7)
Eosinophils Relative: 1 % (ref 0.0–5.0)
HCT: 30.5 % — ABNORMAL LOW (ref 36.0–46.0)
HEMOGLOBIN: 10.2 g/dL — AB (ref 12.0–15.0)
LYMPHS ABS: 1.3 10*3/uL (ref 0.7–4.0)
Lymphocytes Relative: 21.5 % (ref 12.0–46.0)
MCHC: 33.3 g/dL (ref 30.0–36.0)
MCV: 99.9 fl (ref 78.0–100.0)
MONO ABS: 0.4 10*3/uL (ref 0.1–1.0)
Monocytes Relative: 6.7 % (ref 3.0–12.0)
Neutro Abs: 4.4 10*3/uL (ref 1.4–7.7)
Neutrophils Relative %: 70.4 % (ref 43.0–77.0)
Platelets: 202 10*3/uL (ref 150.0–400.0)
RBC: 3.05 Mil/uL — AB (ref 3.87–5.11)
RDW: 13.4 % (ref 11.5–15.5)
WBC: 6.3 10*3/uL (ref 4.0–10.5)

## 2015-07-09 LAB — COMPREHENSIVE METABOLIC PANEL
ALK PHOS: 47 U/L (ref 39–117)
ALT: 8 U/L (ref 0–35)
AST: 17 U/L (ref 0–37)
Albumin: 4.2 g/dL (ref 3.5–5.2)
BILIRUBIN TOTAL: 0.5 mg/dL (ref 0.2–1.2)
BUN: 35 mg/dL — ABNORMAL HIGH (ref 6–23)
CALCIUM: 9.4 mg/dL (ref 8.4–10.5)
CO2: 26 meq/L (ref 19–32)
Chloride: 104 mEq/L (ref 96–112)
Creatinine, Ser: 1.55 mg/dL — ABNORMAL HIGH (ref 0.40–1.20)
GFR: 34.47 mL/min — AB (ref >60.00)
Glucose, Bld: 94 mg/dL (ref 70–99)
Potassium: 4.4 mEq/L (ref 3.5–5.1)
Sodium: 139 mEq/L (ref 135–145)
Total Protein: 7.1 g/dL (ref 6.0–8.3)

## 2015-07-09 LAB — IBC PANEL
Iron: 56 ug/dL (ref 42–145)
SATURATION RATIOS: 22.3 % (ref 20.0–50.0)
TRANSFERRIN: 179 mg/dL — AB (ref 212.0–360.0)

## 2015-07-16 ENCOUNTER — Telehealth: Payer: Self-pay | Admitting: *Deleted

## 2015-07-16 NOTE — Telephone Encounter (Signed)
Patient is calling for lab results. Patient told that Hgb is better but MD has not reviewed them yet.

## 2015-07-18 ENCOUNTER — Other Ambulatory Visit (INDEPENDENT_AMBULATORY_CARE_PROVIDER_SITE_OTHER): Payer: Medicare Other

## 2015-07-18 DIAGNOSIS — I270 Primary pulmonary hypertension: Secondary | ICD-10-CM | POA: Diagnosis not present

## 2015-07-18 LAB — CBC
HCT: 30 % — ABNORMAL LOW (ref 36.0–46.0)
HEMOGLOBIN: 10 g/dL — AB (ref 12.0–15.0)
MCHC: 33.4 g/dL (ref 30.0–36.0)
MCV: 99.3 fl (ref 78.0–100.0)
Platelets: 206 10*3/uL (ref 150.0–400.0)
RBC: 3.02 Mil/uL — AB (ref 3.87–5.11)
RDW: 13.2 % (ref 11.5–15.5)
WBC: 6.1 10*3/uL (ref 4.0–10.5)

## 2015-07-18 LAB — COMPREHENSIVE METABOLIC PANEL
ALBUMIN: 4.1 g/dL (ref 3.5–5.2)
ALK PHOS: 47 U/L (ref 39–117)
ALT: 8 U/L (ref 0–35)
AST: 18 U/L (ref 0–37)
BUN: 25 mg/dL — AB (ref 6–23)
CALCIUM: 9.1 mg/dL (ref 8.4–10.5)
CO2: 26 mEq/L (ref 19–32)
Chloride: 103 mEq/L (ref 96–112)
Creatinine, Ser: 1.3 mg/dL — ABNORMAL HIGH (ref 0.40–1.20)
GFR: 42.23 mL/min — AB (ref >60.00)
Glucose, Bld: 82 mg/dL (ref 70–99)
POTASSIUM: 4.2 meq/L (ref 3.5–5.1)
SODIUM: 135 meq/L (ref 135–145)
TOTAL PROTEIN: 6.7 g/dL (ref 6.0–8.3)
Total Bilirubin: 0.4 mg/dL (ref 0.2–1.2)

## 2015-08-15 ENCOUNTER — Other Ambulatory Visit (INDEPENDENT_AMBULATORY_CARE_PROVIDER_SITE_OTHER): Payer: Medicare Other

## 2015-08-15 DIAGNOSIS — I272 Other secondary pulmonary hypertension: Secondary | ICD-10-CM | POA: Diagnosis not present

## 2015-08-15 LAB — COMPREHENSIVE METABOLIC PANEL
ALBUMIN: 4.1 g/dL (ref 3.5–5.2)
ALK PHOS: 49 U/L (ref 39–117)
ALT: 8 U/L (ref 0–35)
AST: 15 U/L (ref 0–37)
BILIRUBIN TOTAL: 0.5 mg/dL (ref 0.2–1.2)
BUN: 26 mg/dL — AB (ref 6–23)
CALCIUM: 9.3 mg/dL (ref 8.4–10.5)
CO2: 25 mEq/L (ref 19–32)
CREATININE: 1.35 mg/dL — AB (ref 0.40–1.20)
Chloride: 105 mEq/L (ref 96–112)
GFR: 40.42 mL/min — ABNORMAL LOW (ref >60.00)
Glucose, Bld: 115 mg/dL — ABNORMAL HIGH (ref 70–99)
Potassium: 4.4 mEq/L (ref 3.5–5.1)
SODIUM: 137 meq/L (ref 135–145)
TOTAL PROTEIN: 6.9 g/dL (ref 6.0–8.3)

## 2015-08-15 LAB — CBC
HEMATOCRIT: 30.8 % — AB (ref 36.0–46.0)
Hemoglobin: 10 g/dL — ABNORMAL LOW (ref 12.0–15.0)
MCHC: 32.6 g/dL (ref 30.0–36.0)
MCV: 99.4 fl (ref 78.0–100.0)
Platelets: 208 10*3/uL (ref 150.0–400.0)
RBC: 3.09 Mil/uL — ABNORMAL LOW (ref 3.87–5.11)
RDW: 13.6 % (ref 11.5–15.5)
WBC: 6.8 10*3/uL (ref 4.0–10.5)

## 2015-08-20 ENCOUNTER — Encounter: Payer: Self-pay | Admitting: Internal Medicine

## 2015-08-31 ENCOUNTER — Encounter: Payer: Self-pay | Admitting: Internal Medicine

## 2015-08-31 ENCOUNTER — Ambulatory Visit (INDEPENDENT_AMBULATORY_CARE_PROVIDER_SITE_OTHER): Payer: Medicare Other | Admitting: Internal Medicine

## 2015-08-31 VITALS — BP 110/62 | HR 71 | Temp 98.1°F | Resp 16 | Ht 64.0 in | Wt 128.1 lb

## 2015-08-31 DIAGNOSIS — I272 Other secondary pulmonary hypertension: Secondary | ICD-10-CM | POA: Diagnosis not present

## 2015-08-31 DIAGNOSIS — N183 Chronic kidney disease, stage 3 unspecified: Secondary | ICD-10-CM

## 2015-08-31 DIAGNOSIS — I10 Essential (primary) hypertension: Secondary | ICD-10-CM | POA: Diagnosis not present

## 2015-08-31 MED ORDER — FLUTICASONE PROPIONATE 50 MCG/ACT NA SUSP: NASAL | Status: DC

## 2015-08-31 MED ORDER — ALBUTEROL SULFATE HFA 108 (90 BASE) MCG/ACT IN AERS: 2.0000 | INHALATION_SPRAY | Freq: Four times a day (QID) | RESPIRATORY_TRACT | Status: DC | PRN

## 2015-08-31 NOTE — Progress Notes (Signed)
Pre visit review using our clinic review tool, if applicable. No additional management support is needed unless otherwise documented below in the visit note.

## 2015-08-31 NOTE — Progress Notes (Signed)
Subjective:    Patient ID: Paula Ward, female    DOB: 1936-09-17, 79 y.o.   MRN: [id number]  HPI The patient is a 79 YO female coming in for follow up of her medical problems including her CKD stage 3 (recent labs stable, BP at goal, no diabetes, on ARB), her blood pressure (BP at goal on losartan and lasix and diltiazem, complicated by CKD stage 3), and her breathing (has pulmonary hypertension, was supposed to see her specialist at [facility] later this month but they delayed the visit to February due to her doctor's schedule). No new concerns.   Review of Systems  Constitutional: Negative for activity change, appetite change, fatigue and unexpected weight change.  HENT: Negative.   Eyes: Negative.   Respiratory: Positive for shortness of breath. Negative for cough, chest tightness and wheezing.   Cardiovascular: Negative for chest pain, palpitations and leg swelling.  Gastrointestinal: Negative for abdominal pain, diarrhea, constipation and abdominal distention.  Endocrine: Negative.   Musculoskeletal: Positive for myalgias and arthralgias. Negative for gait problem.  Skin: Negative.   Neurological: Negative for dizziness, weakness, light-headedness and headaches.  Hematological: Negative.   Psychiatric/Behavioral: Negative.       Objective:   Physical Exam  Constitutional: She is oriented to person, place, and time. She appears well-developed and well-nourished. No distress.  HENT:  Head: Normocephalic and atraumatic.  Eyes: EOM are normal.  Neck: Normal range of motion.  Cardiovascular: Normal rate and regular rhythm.   Murmur heard. Pulmonary/Chest: Effort normal and breath sounds normal. No respiratory distress. She has no rales.  Abdominal: Soft. Bowel sounds are normal.  Musculoskeletal: She exhibits tenderness.  Neurological: She is alert and oriented to person, place, and time. Coordination normal.  Skin: Skin is warm and dry.   Filed Vitals:   08/31/15 1433  BP:  110/62  Pulse: 71  Temp: 98.1 F (36.7 C)  TempSrc: Oral  Resp: 16  Height: 5' 4" (1.626 m)  Weight: 128 lb 1.9 oz (58.115 kg)  SpO2: 93%      Assessment & Plan:

## 2015-08-31 NOTE — Patient Instructions (Signed)
You can use a lotion on the rash on your chest or cortisone cream. It should disappear.   We have sent in the refills. If you want to get the labs checked when you come back for the GI doctor that would be fine.

## 2015-09-01 NOTE — Assessment & Plan Note (Signed)
Still on her same medication and we are monitoring her kidney function and blood counts monthly and they are stable.

## 2015-09-01 NOTE — Assessment & Plan Note (Signed)
Creatinine had been rising some and has stabilized. BP under good control, no diabetes.

## 2015-09-01 NOTE — Assessment & Plan Note (Signed)
BP well controlled on diltiazem, lasix and losartan. Recent labs stable and continue to monitor.

## 2015-09-11 ENCOUNTER — Other Ambulatory Visit (INDEPENDENT_AMBULATORY_CARE_PROVIDER_SITE_OTHER): Payer: Medicare Other

## 2015-09-11 ENCOUNTER — Encounter: Payer: Self-pay | Admitting: Gastroenterology

## 2015-09-11 ENCOUNTER — Ambulatory Visit (INDEPENDENT_AMBULATORY_CARE_PROVIDER_SITE_OTHER): Payer: Medicare Other | Admitting: Gastroenterology

## 2015-09-11 VITALS — BP 126/64 | HR 70 | Ht 64.0 in | Wt 130.0 lb

## 2015-09-11 DIAGNOSIS — D649 Anemia, unspecified: Secondary | ICD-10-CM

## 2015-09-11 LAB — COMPREHENSIVE METABOLIC PANEL
ALK PHOS: 48 U/L (ref 39–117)
ALT: 10 U/L (ref 0–35)
AST: 20 U/L (ref 0–37)
Albumin: 4.3 g/dL (ref 3.5–5.2)
BILIRUBIN TOTAL: 0.4 mg/dL (ref 0.2–1.2)
BUN: 23 mg/dL (ref 6–23)
CO2: 25 mEq/L (ref 19–32)
Calcium: 9.4 mg/dL (ref 8.4–10.5)
Chloride: 106 mEq/L (ref 96–112)
Creatinine, Ser: 1.37 mg/dL — ABNORMAL HIGH (ref 0.40–1.20)
GFR: 39.73 mL/min — ABNORMAL LOW (ref >60.00)
GLUCOSE: 108 mg/dL — AB (ref 70–99)
Potassium: 4.2 mEq/L (ref 3.5–5.1)
SODIUM: 141 meq/L (ref 135–145)
TOTAL PROTEIN: 7.2 g/dL (ref 6.0–8.3)

## 2015-09-11 LAB — CBC WITH DIFFERENTIAL/PLATELET
BASOS ABS: 0 10*3/uL (ref 0.0–0.1)
Basophils Relative: 0.4 % (ref 0.0–3.0)
EOS ABS: 0.1 10*3/uL (ref 0.0–0.7)
Eosinophils Relative: 1.3 % (ref 0.0–5.0)
HCT: 31.6 % — ABNORMAL LOW (ref 36.0–46.0)
Hemoglobin: 10.3 g/dL — ABNORMAL LOW (ref 12.0–15.0)
LYMPHS ABS: 1.6 10*3/uL (ref 0.7–4.0)
Lymphocytes Relative: 22.1 % (ref 12.0–46.0)
MCHC: 32.5 g/dL (ref 30.0–36.0)
MCV: 99.3 fl (ref 78.0–100.0)
MONO ABS: 0.3 10*3/uL (ref 0.1–1.0)
MONOS PCT: 4.9 % (ref 3.0–12.0)
NEUTROS PCT: 71.3 % (ref 43.0–77.0)
Neutro Abs: 5 10*3/uL (ref 1.4–7.7)
Platelets: 221 10*3/uL (ref 150.0–400.0)
RBC: 3.18 Mil/uL — AB (ref 3.87–5.11)
RDW: 14 % (ref 11.5–15.5)
WBC: 7 10*3/uL (ref 4.0–10.5)

## 2015-09-11 LAB — FERRITIN: Ferritin: 78.9 ng/mL (ref 10.0–291.0)

## 2015-09-11 NOTE — Progress Notes (Signed)
Paula Ward    [id number]    Oct 25, 1935  Primary Care Physician:Elizabeth Becky Augusta, MD  Referring Physician: Hoyt Koch, MD [place], [place] [id number]  Chief complaint:  CREST syndrome  HPI:  79 yo white female with  CREST syndrome former Dr. Olevia Perches patient. She has pulmonary hypertension and is followed at [facility]. History of AVMs was on iron infusions, last 2 iron infusions were on 12/29 and 10/11/2015. Last colonoscopy in September 2015 showed diverticulosis but no AVMs. Tubular adenoma was removed from descending colon at that time. Her hemoglobin remains stable around 10 for the past 1 year. She is on iron supplements.    She denies abdominal pain diarrhea, nausea, vomiting, heartburn, odynophagia or dysphagia.    Outpatient Encounter Prescriptions as of 09/11/2015  Medication Sig  . acetaminophen (TYLENOL) 325 MG tablet Take 650 mg by mouth every 6 (six) hours as needed.  Marland Kitchen albuterol (PROVENTIL HFA;VENTOLIN HFA) 108 (90 BASE) MCG/ACT inhaler Inhale 2 puffs into the lungs every 6 (six) hours as needed for wheezing or shortness of breath.  . ALPRAZolam (XANAX) 0.25 MG tablet Take 0.5 tablets (0.125 mg total) by mouth at bedtime as needed.  . calcium citrate-vitamin D 200-200 MG-UNIT TABS Take 1 tablet by mouth daily.  . chlorhexidine (PERIDEX) 0.12 % solution swish mouth with 1/2 ounce for 60 seconds and spit out. DO NOT EA...  (REFER TO PRESCRIPTION NOTES).  . conjugated estrogens (PREMARIN) vaginal cream Place vaginally daily.  Marland Kitchen diltiazem (CARDIZEM CD) 120 MG 24 hr capsule Take 120 mg by mouth daily.    . fluticasone (FLONASE) 50 MCG/ACT nasal spray instill 1 spray into each nostril twice a day if needed  . furosemide (LASIX) 20 MG tablet Take 20 mg by mouth daily.   . IRON PO Take by mouth. Pt uses Vitamin Code brand  . loratadine (CLARITIN) 10 MG tablet Take 10 mg by mouth daily.  Marland Kitchen losartan (COZAAR) 25 MG tablet Take 25 mg by mouth  daily.  Marland Kitchen omeprazole (PRILOSEC) 40 MG capsule Take 1 capsule (40 mg total) by mouth 2 (two) times daily.  . OPSUMIT 10 MG TABS   . [DISCONTINUED] cholecalciferol (VITAMIN D) 1000 UNITS tablet Take 1,000 Units by mouth daily.   No facility-administered encounter medications on file as of 09/11/2015.    Allergies as of 09/11/2015 - Review Complete 09/11/2015  Allergen Reaction Noted  . Codeine Nausea Only 08/03/2008  . Lisinopril Other (See Comments) 12/14/2014  . Other Nausea And Vomiting 04/05/2012    Past Medical History  Diagnosis Date  . Anemia   . Pulmonary embolus ([place]) 2003  . CREST syndrome ([place])   . GERD (gastroesophageal reflux disease)     w/ HH  . Angiodysplasia of stomach and duodenum   . Arthus phenomenon   . Pulmonary hypertension ([place])     followed by Dr Gaynell Face at [facility]  . Interstitial lung disease ([place])   . Scleroderma ([place])   . Rectal incontinence   . AVM (arteriovenous malformation)   . Nodule of kidney   . Uterine polyp   . Chronic kidney disease     Chronic mild renal insuffiency  . Breast cancer ([place]) 1989    Left  . Renal cell carcinoma ([place])   . Candida esophagitis ([place])   . Cataract   . Tubular adenoma of colon     Past Surgical History  Procedure Laterality Date  .  Tonsillectomy    . Appendectomy  1962  . Breast lumpectomy  1989    left  . Ivc filter    . Kidney surgery      left   . Cataract extraction, bilateral Bilateral 12/2013    Family History  Problem Relation Age of Onset  . Bladder Cancer Father   . Diabetes Father   . Prostate cancer Father   . Alzheimer's disease Mother   . Colon cancer Neg Hx   . Lung cancer Sister   . Diabetes Sister   . Lung cancer Sister      smoker  . Esophageal cancer Paternal Uncle     Social History   Social History  . Marital Status: Married    Spouse Name: N/A  . Number of Children: N/A  . Years of Education: N/A   Occupational History  . Retired    Social History Main Topics    . Smoking status: Never Smoker   . Smokeless tobacco: Never Used  . Alcohol Use: No  . Drug Use: No  . Sexual Activity: No   Other Topics Concern  . Not on file   Social History Narrative   Married '61   1 son- '65, 1 daughter '63; 6 children (2 adopted)   SO- SOB   Retirement- doing well   Marriage in good health   Patient has never smoked   Alcohol use- no   Pt gets regular exercise            Review of systems: Review of Systems  Constitutional: Negative for fever and chills.  HENT: Negative.   Eyes: Negative for blurred vision.  Respiratory: Negative for cough, shortness of breath and wheezing.   Cardiovascular: Negative for chest pain and palpitations.  Gastrointestinal: as per HPI Genitourinary: Negative for dysuria, urgency, frequency and hematuria.  Musculoskeletal: Negative for myalgias, back pain and joint pain.  Skin: Negative for itching and rash.  Neurological: Negative for dizziness, tremors, focal weakness, seizures and loss of consciousness.  Endo/Heme/Allergies: Negative for environmental allergies.  Psychiatric/Behavioral: Negative for depression, suicidal ideas and hallucinations.  All other systems reviewed and are negative.   Physical Exam: Filed Vitals:   09/11/15 1335  BP: 126/64  Pulse: 70   Gen:      No acute distress HEENT:  EOMI, sclera anicteric Neck:     No masses; no thyromegaly Lungs:    Clear to auscultation bilaterally; normal respiratory effort CV:         Regular rate and rhythm; no murmurs Abd:      + bowel sounds; soft, non-tender; no palpable masses, no distension Ext:    No edema; adequate peripheral perfusion Skin:      Warm and dry; no rash Neuro: alert and oriented x 3 Psych: normal mood and affect  Data Reviewed:  Reviewed labs   Assessment and Plan/Recommendations:  79 yo white female with  CREST syndrome and history of AVMs was on iron infusions in the past here for follow-up visit Patient's hemoglobin  and iron saturation is stable, continue oral iron We'll check ferritin level and cbc today per patient's request Last colonoscopy in 2015, 1 tubular adenoma was removed. Last endoscopy in 2014, reflux esophagitis and no evidence of Barrett's esophagus Return in 1 year

## 2015-09-11 NOTE — Patient Instructions (Signed)
Go to the basement for labs today Follow up in 1 year

## 2015-10-18 ENCOUNTER — Other Ambulatory Visit (INDEPENDENT_AMBULATORY_CARE_PROVIDER_SITE_OTHER): Payer: Medicare Other

## 2015-10-18 DIAGNOSIS — I272 Other secondary pulmonary hypertension: Secondary | ICD-10-CM

## 2015-10-18 LAB — COMPREHENSIVE METABOLIC PANEL
ALT: 11 U/L (ref 0–35)
AST: 23 U/L (ref 0–37)
Albumin: 4.1 g/dL (ref 3.5–5.2)
Alkaline Phosphatase: 46 U/L (ref 39–117)
BILIRUBIN TOTAL: 0.4 mg/dL (ref 0.2–1.2)
BUN: 31 mg/dL — ABNORMAL HIGH (ref 6–23)
CALCIUM: 9.2 mg/dL (ref 8.4–10.5)
CHLORIDE: 107 meq/L (ref 96–112)
CO2: 23 meq/L (ref 19–32)
Creatinine, Ser: 1.16 mg/dL (ref 0.40–1.20)
GFR: 48.13 mL/min — AB (ref >60.00)
Glucose, Bld: 110 mg/dL — ABNORMAL HIGH (ref 70–99)
Potassium: 4.4 mEq/L (ref 3.5–5.1)
Sodium: 138 mEq/L (ref 135–145)
Total Protein: 6.7 g/dL (ref 6.0–8.3)

## 2015-10-18 LAB — CBC
HEMATOCRIT: 30.2 % — AB (ref 36.0–46.0)
Hemoglobin: 9.8 g/dL — ABNORMAL LOW (ref 12.0–15.0)
MCHC: 32.4 g/dL (ref 30.0–36.0)
MCV: 99.5 fl (ref 78.0–100.0)
PLATELETS: 220 10*3/uL (ref 150.0–400.0)
RBC: 3.04 Mil/uL — AB (ref 3.87–5.11)
RDW: 14.4 % (ref 11.5–15.5)
WBC: 6.2 10*3/uL (ref 4.0–10.5)

## 2015-11-16 ENCOUNTER — Other Ambulatory Visit (INDEPENDENT_AMBULATORY_CARE_PROVIDER_SITE_OTHER): Payer: Medicare Other

## 2015-11-16 DIAGNOSIS — I272 Other secondary pulmonary hypertension: Secondary | ICD-10-CM

## 2015-11-16 LAB — COMPREHENSIVE METABOLIC PANEL
ALT: 11 U/L (ref 0–35)
AST: 21 U/L (ref 0–37)
Albumin: 4.4 g/dL (ref 3.5–5.2)
Alkaline Phosphatase: 50 U/L (ref 39–117)
BUN: 33 mg/dL — AB (ref 6–23)
CHLORIDE: 105 meq/L (ref 96–112)
CO2: 24 meq/L (ref 19–32)
CREATININE: 1.22 mg/dL — AB (ref 0.40–1.20)
Calcium: 9.3 mg/dL (ref 8.4–10.5)
GFR: 45.4 mL/min — ABNORMAL LOW (ref >60.00)
Glucose, Bld: 112 mg/dL — ABNORMAL HIGH (ref 70–99)
POTASSIUM: 4.2 meq/L (ref 3.5–5.1)
SODIUM: 137 meq/L (ref 135–145)
Total Bilirubin: 0.4 mg/dL (ref 0.2–1.2)
Total Protein: 7.1 g/dL (ref 6.0–8.3)

## 2015-11-16 LAB — CBC
HEMATOCRIT: 33.3 % — AB (ref 36.0–46.0)
HEMOGLOBIN: 10.8 g/dL — AB (ref 12.0–15.0)
MCHC: 32.4 g/dL (ref 30.0–36.0)
MCV: 99.9 fl (ref 78.0–100.0)
Platelets: 226 10*3/uL (ref 150.0–400.0)
RBC: 3.33 Mil/uL — AB (ref 3.87–5.11)
RDW: 14.2 % (ref 11.5–15.5)
WBC: 7.2 10*3/uL (ref 4.0–10.5)

## 2015-12-18 ENCOUNTER — Other Ambulatory Visit (INDEPENDENT_AMBULATORY_CARE_PROVIDER_SITE_OTHER): Payer: Medicare Other

## 2015-12-18 DIAGNOSIS — I272 Other secondary pulmonary hypertension: Secondary | ICD-10-CM

## 2015-12-18 LAB — COMPREHENSIVE METABOLIC PANEL
ALT: 10 U/L (ref 0–35)
AST: 21 U/L (ref 0–37)
Albumin: 4.1 g/dL (ref 3.5–5.2)
Alkaline Phosphatase: 37 U/L — ABNORMAL LOW (ref 39–117)
BUN: 37 mg/dL — AB (ref 6–23)
CHLORIDE: 103 meq/L (ref 96–112)
CO2: 28 mEq/L (ref 19–32)
CREATININE: 1.2 mg/dL (ref 0.40–1.20)
Calcium: 9.2 mg/dL (ref 8.4–10.5)
GFR: 46.26 mL/min — ABNORMAL LOW (ref >60.00)
GLUCOSE: 94 mg/dL (ref 70–99)
POTASSIUM: 4.6 meq/L (ref 3.5–5.1)
SODIUM: 139 meq/L (ref 135–145)
Total Bilirubin: 0.5 mg/dL (ref 0.2–1.2)
Total Protein: 6.6 g/dL (ref 6.0–8.3)

## 2015-12-18 LAB — CBC
HEMATOCRIT: 27.8 % — AB (ref 36.0–46.0)
HEMOGLOBIN: 9.2 g/dL — AB (ref 12.0–15.0)
MCHC: 33.1 g/dL (ref 30.0–36.0)
MCV: 98.5 fl (ref 78.0–100.0)
PLATELETS: 211 10*3/uL (ref 150.0–400.0)
RBC: 2.83 Mil/uL — AB (ref 3.87–5.11)
RDW: 14.1 % (ref 11.5–15.5)
WBC: 9.8 10*3/uL (ref 4.0–10.5)

## 2015-12-20 ENCOUNTER — Emergency Department (HOSPITAL_BASED_OUTPATIENT_CLINIC_OR_DEPARTMENT_OTHER): Payer: Medicare Other

## 2015-12-20 ENCOUNTER — Inpatient Hospital Stay (HOSPITAL_BASED_OUTPATIENT_CLINIC_OR_DEPARTMENT_OTHER)
Admission: EM | Admit: 2015-12-20 | Discharge: 2015-12-23 | DRG: 300 | Disposition: A | Discharge status: Home / Self Care | Payer: Medicare Other | Attending: Internal Medicine | Admitting: Internal Medicine

## 2015-12-20 ENCOUNTER — Encounter (HOSPITAL_BASED_OUTPATIENT_CLINIC_OR_DEPARTMENT_OTHER): Payer: Self-pay | Admitting: *Deleted

## 2015-12-20 DIAGNOSIS — K219 Gastro-esophageal reflux disease without esophagitis: Secondary | ICD-10-CM | POA: Diagnosis present

## 2015-12-20 DIAGNOSIS — I272 Other secondary pulmonary hypertension: Secondary | ICD-10-CM

## 2015-12-20 DIAGNOSIS — K449 Diaphragmatic hernia without obstruction or gangrene: Secondary | ICD-10-CM | POA: Diagnosis present

## 2015-12-20 DIAGNOSIS — M341 CR(E)ST syndrome: Secondary | ICD-10-CM | POA: Diagnosis present

## 2015-12-20 DIAGNOSIS — J209 Acute bronchitis, unspecified: Secondary | ICD-10-CM | POA: Diagnosis present

## 2015-12-20 DIAGNOSIS — Z79899 Other long term (current) drug therapy: Secondary | ICD-10-CM

## 2015-12-20 DIAGNOSIS — Z881 Allergy status to other antibiotic agents status: Secondary | ICD-10-CM

## 2015-12-20 DIAGNOSIS — Z85528 Personal history of other malignant neoplasm of kidney: Secondary | ICD-10-CM | POA: Diagnosis not present

## 2015-12-20 DIAGNOSIS — K2970 Gastritis, unspecified, without bleeding: Secondary | ICD-10-CM | POA: Diagnosis present

## 2015-12-20 DIAGNOSIS — Z800 Family history of malignant neoplasm of digestive organs: Secondary | ICD-10-CM

## 2015-12-20 DIAGNOSIS — Z833 Family history of diabetes mellitus: Secondary | ICD-10-CM | POA: Diagnosis not present

## 2015-12-20 DIAGNOSIS — E860 Dehydration: Secondary | ICD-10-CM | POA: Diagnosis present

## 2015-12-20 DIAGNOSIS — N179 Acute kidney failure, unspecified: Secondary | ICD-10-CM | POA: Diagnosis present

## 2015-12-20 DIAGNOSIS — K31819 Angiodysplasia of stomach and duodenum without bleeding: Secondary | ICD-10-CM | POA: Diagnosis present

## 2015-12-20 DIAGNOSIS — Z885 Allergy status to narcotic agent status: Secondary | ICD-10-CM

## 2015-12-20 DIAGNOSIS — Z86711 Personal history of pulmonary embolism: Secondary | ICD-10-CM

## 2015-12-20 DIAGNOSIS — K314 Gastric diverticulum: Secondary | ICD-10-CM | POA: Diagnosis present

## 2015-12-20 DIAGNOSIS — K921 Melena: Secondary | ICD-10-CM | POA: Diagnosis present

## 2015-12-20 DIAGNOSIS — Q2733 Arteriovenous malformation of digestive system vessel: Principal | ICD-10-CM

## 2015-12-20 DIAGNOSIS — D62 Acute posthemorrhagic anemia: Secondary | ICD-10-CM | POA: Diagnosis present

## 2015-12-20 DIAGNOSIS — N189 Chronic kidney disease, unspecified: Secondary | ICD-10-CM | POA: Diagnosis present

## 2015-12-20 DIAGNOSIS — Z888 Allergy status to other drugs, medicaments and biological substances status: Secondary | ICD-10-CM | POA: Diagnosis not present

## 2015-12-20 DIAGNOSIS — C649 Malignant neoplasm of unspecified kidney, except renal pelvis: Secondary | ICD-10-CM | POA: Diagnosis present

## 2015-12-20 DIAGNOSIS — I2729 Other secondary pulmonary hypertension: Secondary | ICD-10-CM | POA: Diagnosis present

## 2015-12-20 DIAGNOSIS — Z853 Personal history of malignant neoplasm of breast: Secondary | ICD-10-CM

## 2015-12-20 DIAGNOSIS — Z820 Family history of epilepsy and other diseases of the nervous system: Secondary | ICD-10-CM

## 2015-12-20 DIAGNOSIS — I129 Hypertensive chronic kidney disease with stage 1 through stage 4 chronic kidney disease, or unspecified chronic kidney disease: Secondary | ICD-10-CM | POA: Diagnosis present

## 2015-12-20 DIAGNOSIS — Z801 Family history of malignant neoplasm of trachea, bronchus and lung: Secondary | ICD-10-CM

## 2015-12-20 DIAGNOSIS — K922 Gastrointestinal hemorrhage, unspecified: Secondary | ICD-10-CM | POA: Diagnosis not present

## 2015-12-20 DIAGNOSIS — Z8052 Family history of malignant neoplasm of bladder: Secondary | ICD-10-CM

## 2015-12-20 HISTORY — DX: Encounter for other specified aftercare: Z51.89

## 2015-12-20 LAB — CBC
HEMATOCRIT: 18.7 % — AB (ref 36.0–46.0)
HEMOGLOBIN: 6 g/dL — AB (ref 12.0–15.0)
MCH: 33.1 pg (ref 26.0–34.0)
MCHC: 32.1 g/dL (ref 30.0–36.0)
MCV: 103.3 fL — ABNORMAL HIGH (ref 78.0–100.0)
Platelets: 176 10*3/uL (ref 150–400)
RBC: 1.81 MIL/uL — AB (ref 3.87–5.11)
RDW: 13.7 % (ref 11.5–15.5)
WBC: 9 10*3/uL (ref 4.0–10.5)

## 2015-12-20 LAB — COMPREHENSIVE METABOLIC PANEL
ALBUMIN: 3.8 g/dL (ref 3.5–5.0)
ALT: 13 U/L — ABNORMAL LOW (ref 14–54)
ANION GAP: 7 (ref 5–15)
AST: 21 U/L (ref 15–41)
Alkaline Phosphatase: 33 U/L — ABNORMAL LOW (ref 38–126)
BUN: 62 mg/dL — ABNORMAL HIGH (ref 6–20)
CO2: 24 mmol/L (ref 22–32)
Calcium: 8.7 mg/dL — ABNORMAL LOW (ref 8.9–10.3)
Chloride: 105 mmol/L (ref 101–111)
Creatinine, Ser: 1.59 mg/dL — ABNORMAL HIGH (ref 0.44–1.00)
GFR calc non Af Amer: 30 mL/min — ABNORMAL LOW (ref >60)
GFR, EST AFRICAN AMERICAN: 35 mL/min — AB (ref >60)
GLUCOSE: 111 mg/dL — AB (ref 65–99)
POTASSIUM: 4.4 mmol/L (ref 3.5–5.1)
SODIUM: 136 mmol/L (ref 135–145)
TOTAL PROTEIN: 5.9 g/dL — AB (ref 6.5–8.1)
Total Bilirubin: 0.4 mg/dL (ref 0.3–1.2)

## 2015-12-20 LAB — PREPARE RBC (CROSSMATCH)

## 2015-12-20 LAB — BRAIN NATRIURETIC PEPTIDE: B Natriuretic Peptide: 470.4 pg/mL — ABNORMAL HIGH (ref 0.0–100.0)

## 2015-12-20 LAB — LIPASE, BLOOD: Lipase: 36 U/L (ref 11–51)

## 2015-12-20 MED ORDER — SODIUM CHLORIDE 0.9 % IV SOLN
80.0000 mg | Freq: Once | INTRAVENOUS | Status: AC
  Administered 2015-12-20: 80 mg via INTRAVENOUS
  Filled 2015-12-20: qty 80

## 2015-12-20 MED ORDER — ALBUTEROL SULFATE HFA 108 (90 BASE) MCG/ACT IN AERS: 2.0000 | INHALATION_SPRAY | Freq: Four times a day (QID) | RESPIRATORY_TRACT | Status: DC | PRN

## 2015-12-20 MED ORDER — ONDANSETRON HCL 4 MG PO TABS
4.0000 mg | ORAL_TABLET | Freq: Four times a day (QID) | ORAL | Status: DC | PRN
  Administered 2015-12-22: 4 mg via ORAL
  Filled 2015-12-20: qty 1

## 2015-12-20 MED ORDER — ONDANSETRON HCL 4 MG/2ML IJ SOLN
4.0000 mg | Freq: Four times a day (QID) | INTRAMUSCULAR | Status: DC | PRN
Start: 2015-12-20 — End: 2015-12-23

## 2015-12-20 MED ORDER — ALPRAZOLAM 0.25 MG PO TABS: 0.1250 mg | ORAL_TABLET | Freq: Every evening | ORAL | Status: DC | PRN

## 2015-12-20 MED ORDER — ONDANSETRON HCL 4 MG/2ML IJ SOLN
4.0000 mg | Freq: Once | INTRAMUSCULAR | Status: AC
  Administered 2015-12-20: 4 mg via INTRAVENOUS
  Filled 2015-12-20: qty 2

## 2015-12-20 MED ORDER — ALBUTEROL SULFATE (2.5 MG/3ML) 0.083% IN NEBU: 2.5000 mg | INHALATION_SOLUTION | Freq: Four times a day (QID) | RESPIRATORY_TRACT | Status: DC | PRN

## 2015-12-20 MED ORDER — SODIUM CHLORIDE 0.9 % IV SOLN: Freq: Once | INTRAVENOUS | Status: DC

## 2015-12-20 MED ORDER — SODIUM CHLORIDE 0.9 % IV SOLN: INTRAVENOUS | Status: DC

## 2015-12-20 MED ORDER — DILTIAZEM HCL ER COATED BEADS 120 MG PO CP24
120.0000 mg | ORAL_CAPSULE | Freq: Every day | ORAL | Status: DC
  Administered 2015-12-21 – 2015-12-23 (×3): 120 mg via ORAL
  Filled 2015-12-20 (×3): qty 1

## 2015-12-20 MED ORDER — SODIUM CHLORIDE 0.9 % IV SOLN
8.0000 mg/h | INTRAVENOUS | Status: DC
  Administered 2015-12-20 – 2015-12-22 (×4): 8 mg/h via INTRAVENOUS
  Filled 2015-12-20 (×8): qty 80

## 2015-12-20 MED ORDER — SODIUM CHLORIDE 0.9 % IV BOLUS (SEPSIS)
250.0000 mL | Freq: Once | INTRAVENOUS | Status: AC
  Administered 2015-12-20: 250 mL via INTRAVENOUS

## 2015-12-20 MED ORDER — ACETAMINOPHEN 650 MG RE SUPP: 650.0000 mg | Freq: Four times a day (QID) | RECTAL | Status: DC | PRN

## 2015-12-20 MED ORDER — ACETAMINOPHEN 325 MG PO TABS: 650.0000 mg | ORAL_TABLET | Freq: Four times a day (QID) | ORAL | Status: DC | PRN

## 2015-12-20 MED ORDER — FLUTICASONE PROPIONATE 50 MCG/ACT NA SUSP
2.0000 | Freq: Every day | NASAL | Status: DC
  Administered 2015-12-21 – 2015-12-23 (×3): 2 via NASAL
  Filled 2015-12-20: qty 16

## 2015-12-20 MED ORDER — PANTOPRAZOLE SODIUM 40 MG IV SOLR: 40.0000 mg | Freq: Two times a day (BID) | INTRAVENOUS | Status: DC

## 2015-12-20 NOTE — ED Notes (Signed)
Pt transferred to [facility] via [facility].  Husband to take belongings home.

## 2015-12-20 NOTE — ED Notes (Signed)
Patient placed on cardiac monitor. Pt placed on auto vitals Q30.

## 2015-12-20 NOTE — ED Notes (Signed)
md notified of hemoglobin level of 6

## 2015-12-20 NOTE — Plan of Care (Signed)
80 year old female with the history of CREST syndrome presents with rectal bleeding for last 3-4 days. Patient's hemoglobin has dropped by 3 g from recent. Patient is mildly tachycardic but otherwise hemodynamically stable. Patient will be admitted to stepdown unit for acute GI bleeding.  Gean Birchwood.

## 2015-12-20 NOTE — ED Notes (Signed)
Pt reports noticing blood in her stool x 2 days. Hx of GI bleed

## 2015-12-20 NOTE — ED Provider Notes (Signed)
CSN: [id number]     Arrival date & time 12/20/15  1643 History   First MD Initiated Contact with Patient 12/20/15 1727     Chief Complaint  Patient presents with  . Rectal Bleeding     (Consider location/radiation/quality/duration/timing/severity/associated sxs/prior Treatment) Patient is a 80 y.o. female presenting with hematochezia. The history is provided by the patient and the spouse.  Rectal Bleeding Associated symptoms: abdominal pain and light-headedness   Associated symptoms: no fever and no vomiting    patient with onset of red blood in her bowel movements on Monday. Patient with 3-4 bowel movements a day with red blood. With the exception of today when the stool was only dark maroon black in color. No staining of the water red. Patient did feel lightheaded felt like maybe she would pass out but she did not pass out. Patient with a history of a sniffing and GI bleed about the 3-4 years ago. Followed by [facility]. Patient also with a complaint of some epigastric abdominal pain not severe nonradiating.  Past Medical History  Diagnosis Date  . Anemia   . Pulmonary embolus ([place]) 2003  . CREST syndrome ([place])   . GERD (gastroesophageal reflux disease)     w/ HH  . Angiodysplasia of stomach and duodenum   . Arthus phenomenon   . Pulmonary hypertension ([place])     followed by Dr Gaynell Face at [facility]  . Interstitial lung disease ([place])   . Scleroderma ([place])   . Rectal incontinence   . AVM (arteriovenous malformation)   . Nodule of kidney   . Uterine polyp   . Chronic kidney disease     Chronic mild renal insuffiency  . Breast cancer ([place]) 1989    Left  . Renal cell carcinoma ([place])   . Candida esophagitis ([place])   . Cataract   . Tubular adenoma of colon   . Blood transfusion without reported diagnosis    Past Surgical History  Procedure Laterality Date  . Tonsillectomy    . Appendectomy  1962  . Breast lumpectomy  1989    left  . Ivc filter    . Kidney surgery      left    . Cataract extraction, bilateral Bilateral 12/2013   Family History  Problem Relation Age of Onset  . Bladder Cancer Father   . Diabetes Father   . Prostate cancer Father   . Alzheimer's disease Mother   . Colon cancer Neg Hx   . Lung cancer Sister   . Diabetes Sister   . Lung cancer Sister      smoker  . Esophageal cancer Paternal Uncle    Social History  Substance Use Topics  . Smoking status: Never Smoker   . Smokeless tobacco: Never Used  . Alcohol Use: No   OB History    No data available     Review of Systems  Constitutional: Negative for fever.  HENT: Negative for congestion.   Eyes: Negative for visual disturbance.  Respiratory: Negative for shortness of breath.   Cardiovascular: Negative for chest pain.  Gastrointestinal: Positive for abdominal pain, blood in stool and hematochezia. Negative for nausea, vomiting and diarrhea.  Genitourinary: Negative for hematuria.  Musculoskeletal: Negative for back pain.  Skin: Negative for rash.  Neurological: Positive for light-headedness. Negative for syncope.  Hematological: Does not bruise/bleed easily.      Allergies  Codeine; Lisinopril; and Other  Home Medications   Prior to Admission medications   Medication Sig Start  Date End Date Taking? Authorizing Provider  acetaminophen (TYLENOL) 325 MG tablet Take 650 mg by mouth every 6 (six) hours as needed.   Yes Historical Provider, MD  albuterol (PROVENTIL HFA;VENTOLIN HFA) 108 (90 BASE) MCG/ACT inhaler Inhale 2 puffs into the lungs every 6 (six) hours as needed for wheezing or shortness of breath. 08/31/15  Yes Hoyt Koch, MD  ALPRAZolam Duanne Moron) 0.25 MG tablet Take 0.5 tablets (0.125 mg total) by mouth at bedtime as needed. 02/23/15  Yes Hoyt Koch, MD  chlorhexidine (PERIDEX) 0.12 % solution swish mouth with 1/2 ounce for 60 seconds and spit out. DO NOT EA...  (REFER TO PRESCRIPTION NOTES). 07/30/15  Yes Historical Provider, MD  Cyanocobalamin  (VITAMIN B-12 PO) Take by mouth.   Yes Historical Provider, MD  diltiazem (CARDIZEM CD) 120 MG 24 hr capsule Take 120 mg by mouth daily.     Yes Historical Provider, MD  fluticasone (FLONASE) 50 MCG/ACT nasal spray instill 1 spray into each nostril twice a day if needed 08/31/15  Yes Hoyt Koch, MD  furosemide (LASIX) 20 MG tablet Take 20 mg by mouth daily.  06/28/15 06/27/16 Yes Historical Provider, MD  IRON PO Take by mouth. Pt uses Vitamin Code brand   Yes Historical Provider, MD  loratadine (CLARITIN) 10 MG tablet Take 10 mg by mouth daily.   Yes Historical Provider, MD  losartan (COZAAR) 25 MG tablet Take 25 mg by mouth daily.   Yes Historical Provider, MD  omeprazole (PRILOSEC) 40 MG capsule Take 1 capsule (40 mg total) by mouth 2 (two) times daily. 06/14/15  Yes Lafayette Dragon, MD  OPSUMIT 10 MG TABS  08/16/15  Yes Historical Provider, MD  Pyridoxine HCl (VITAMIN B6 PO) Take by mouth.   Yes Historical Provider, MD  calcium citrate-vitamin D 200-200 MG-UNIT TABS Take 1 tablet by mouth daily.    Historical Provider, MD  conjugated estrogens (PREMARIN) vaginal cream Place vaginally daily. 02/23/15   Hoyt Koch, MD   BP 104/30 mmHg  Pulse 88  Temp(Src) 98 F (36.7 C) (Oral)  Resp 18  Ht 5' 4.5" (1.638 m)  Wt 57.607 kg  BMI 21.47 kg/m2  SpO2 98% Physical Exam  Constitutional: She is oriented to person, place, and time. She appears well-developed and well-nourished. No distress.  HENT:  Head: Normocephalic.  Mouth/Throat: Oropharynx is clear and moist.  Eyes: Conjunctivae and EOM are normal. Pupils are equal, round, and reactive to light.  Neck: Normal range of motion. Neck supple.  Cardiovascular: Normal rate, regular rhythm and normal heart sounds.   Pulmonary/Chest: Effort normal and breath sounds normal. No respiratory distress.  Abdominal: Soft. Bowel sounds are normal. There is no tenderness.  Genitourinary:  Rectal exam no external hemorrhoids. No anal fissures.  Normal sphincter tone. No masses. Stool is dark maroon to black in color.  Musculoskeletal: Normal range of motion.  Neurological: She is alert and oriented to person, place, and time. No cranial nerve deficit. She exhibits normal muscle tone. Coordination normal.  Skin: Skin is warm. No rash noted.  Nursing note and vitals reviewed.   ED Course  Procedures (including critical care time) Labs Review Labs Reviewed  COMPREHENSIVE METABOLIC PANEL - Abnormal; Notable for the following:    Glucose, Bld 111 (*)    BUN 62 (*)    Creatinine, Ser 1.59 (*)    Calcium 8.7 (*)    Total Protein 5.9 (*)    ALT 13 (*)    Alkaline Phosphatase  33 (*)    GFR calc non Af Amer 30 (*)    GFR calc Af Amer 35 (*)    All other components within normal limits  CBC - Abnormal; Notable for the following:    RBC 1.81 (*)    Hemoglobin 6.0 (*)    HCT 18.7 (*)    MCV 103.3 (*)    All other components within normal limits  LIPASE, BLOOD  BRAIN NATRIURETIC PEPTIDE   Results for orders placed or performed during the hospital encounter of 12/20/15  Comprehensive metabolic panel  Result Value Ref Range   Sodium 136 135 - 145 mmol/L   Potassium 4.4 3.5 - 5.1 mmol/L   Chloride 105 101 - 111 mmol/L   CO2 24 22 - 32 mmol/L   Glucose, Bld 111 (H) 65 - 99 mg/dL   BUN 62 (H) 6 - 20 mg/dL   Creatinine, Ser 1.59 (H) 0.44 - 1.00 mg/dL   Calcium 8.7 (L) 8.9 - 10.3 mg/dL   Total Protein 5.9 (L) 6.5 - 8.1 g/dL   Albumin 3.8 3.5 - 5.0 g/dL   AST 21 15 - 41 U/L   ALT 13 (L) 14 - 54 U/L   Alkaline Phosphatase 33 (L) 38 - 126 U/L   Total Bilirubin 0.4 0.3 - 1.2 mg/dL   GFR calc non Af Amer 30 (L) >60 mL/min   GFR calc Af Amer 35 (L) >60 mL/min   Anion gap 7 5 - 15  CBC  Result Value Ref Range   WBC 9.0 4.0 - 10.5 K/uL   RBC 1.81 (L) 3.87 - 5.11 MIL/uL   Hemoglobin 6.0 (LL) 12.0 - 15.0 g/dL   HCT 18.7 (L) 36.0 - 46.0 %   MCV 103.3 (H) 78.0 - 100.0 fL   MCH 33.1 26.0 - 34.0 pg   MCHC 32.1 30.0 - 36.0 g/dL    RDW 13.7 11.5 - 15.5 %   Platelets 176 150 - 400 K/uL  Lipase, blood  Result Value Ref Range   Lipase 36 11 - 51 U/L     Imaging Review No results found. I have personally reviewed and evaluated these images and lab results as part of my medical decision-making.   EKG Interpretation   Date/Time:  Thursday December 20 2015 17:07:36 EST Ventricular Rate:  82 PR Interval:  183 QRS Duration: 124 QT Interval:  489 QTC Calculation: 571 R Axis:   71 Text Interpretation:  Sinus rhythm Left bundle branch block Interpretation  limited secondary to artifact Confirmed by Cloris Flippo  MD, Kamarian Sahakian [phone]) on  12/20/2015 5:13:08 PM      MDM   Final diagnoses:  Gastrointestinal hemorrhage, unspecified gastritis, unspecified gastrointestinal hemorrhage type    Patient with blood fusion requirement for GI bleed with anemia. At rest not tachycardic not hypotensive. Rectal exam shows dark black maroon stools. No bright red blood. Abdomen with subjective epigastric abdominal pain but no significant tenderness. Patient preferred admission to [facility] long. Contact [place] they will admit to stepdown unit orders and transfer papers completed.  Patient remains hemodynamically stable at this point in time. Patient will not receive blood here since we have only have emergent blood. Patient will require blood transfusion when she gets there.  Patient's hemoglobin not just a few days ago was 9 something. Liver function test without significant abnormalities.    Fredia Sorrow, MD 12/20/15 1946

## 2015-12-20 NOTE — ED Notes (Signed)
Patient to treatment room via wheelchair, patient able to transfer from w/chair to stretcher and to change into gown without assistance.

## 2015-12-20 NOTE — ED Notes (Signed)
MD at bedside. 

## 2015-12-20 NOTE — H&P (Addendum)
[facility] History and Physical  Paula Ward ZOX:[id number] DOB: [birth year] DOA: 12/20/2015  Referring physician: Patient was transferred from [facility] [place]. PCP: Hoyt Koch, MD  Specialists: Dr. Silverio Ward. Gastroenterologist.   Chief Complaint: Rectal bleeding.  HPI: Paula Ward is a 80 y.o. female with history of gastric AVMs, crest syndrome, pulmonary hypertension present to the ER because of rectal bleeding. Patient states over the last 4 days patient has been noticing increasing black stools. At times patient stool is having frank blood also. Has been having some epigastric discomfort. Patient has had previous history of gastric bleeding from AVMs. Colonoscopy done in 2015 did show diverticulosis as per the report. Patient presently is hemodynamically stable but was initially mildly tachycardic. Patient has 3 g hemoglobin drop from previous. Patient has been admitted for further management of acute GI bleed.   Review of Systems: As presented in the history of presenting illness, rest negative.  Past Medical History  Diagnosis Date  . Anemia   . Pulmonary embolus (Paula Ward) 2003  . CREST syndrome (Paula Ward)   . GERD (gastroesophageal reflux disease)     w/ HH  . Angiodysplasia of stomach and duodenum   . Arthus phenomenon   . Pulmonary hypertension (Paula Ward)     followed by Dr Gaynell Face at [facility]  . Interstitial lung disease (Paula Ward)   . Scleroderma (Paula Ward)   . Rectal incontinence   . AVM (arteriovenous malformation)   . Nodule of kidney   . Uterine polyp   . Chronic kidney disease     Chronic mild renal insuffiency  . Breast cancer (Paula Ward) 1989    Left  . Renal cell carcinoma (Paula Ward)   . Candida esophagitis (Paula Ward)   . Cataract   . Tubular adenoma of colon   . Blood transfusion without reported diagnosis    Past Surgical History  Procedure Laterality Date  . Tonsillectomy    . Appendectomy  1962  . Breast lumpectomy  1989    left  . Ivc filter    . Kidney surgery       left   . Cataract extraction, bilateral Bilateral 12/2013   Social History:  reports that she has never smoked. She has never used smokeless tobacco. She reports that she does not drink alcohol or use illicit drugs. Where does patient live home. Can patient participate in ADLs? Yes.  Allergies  Allergen Reactions  . Codeine Nausea Only    Hallucinations, too  . Lisinopril Other (See Comments)  . Other Nausea And Vomiting    "-mycin" antibiotics.   Also cause hallucinations.    Family History:  Family History  Problem Relation Age of Onset  . Bladder Cancer Father   . Diabetes Father   . Prostate cancer Father   . Alzheimer's disease Mother   . Colon cancer Neg Hx   . Lung cancer Sister   . Diabetes Sister   . Lung cancer Sister      smoker  . Esophageal cancer Paternal Uncle       Prior to Admission medications   Medication Sig Start Date End Date Taking? Authorizing Provider  acetaminophen (TYLENOL) 325 MG tablet Take 650 mg by mouth every 6 (six) hours as needed for mild pain.    Yes Historical Provider, MD  albuterol (PROVENTIL HFA;VENTOLIN HFA) 108 (90 BASE) MCG/ACT inhaler Inhale 2 puffs into the lungs every 6 (six) hours as needed for wheezing or shortness of breath. 08/31/15  Yes Hoyt Koch, MD  ALPRAZolam (XANAX) 0.25 MG tablet Take 0.5 tablets (0.125 mg total) by mouth at bedtime as needed. Patient taking differently: Take 0.125 mg by mouth at bedtime as needed for sleep.  02/23/15  Yes Hoyt Koch, MD  chlorhexidine (PERIDEX) 0.12 % solution swish mouth with 1/2 ounce for 60 seconds and spit out twice daily. DO NOT EA...  (REFER TO PRESCRIPTION NOTES). 07/30/15  Yes Historical Provider, MD  conjugated estrogens (PREMARIN) vaginal cream Place vaginally daily. 02/23/15  Yes Hoyt Koch, MD  Cyanocobalamin (VITAMIN B-12 PO) Take 1 tablet by mouth daily.    Yes Historical Provider, MD  diltiazem (CARDIZEM CD) 120 MG 24 hr capsule Take 120 mg  by mouth daily.     Yes Historical Provider, MD  fluticasone (FLONASE) 50 MCG/ACT nasal spray instill 1 spray into each nostril twice a day if needed 08/31/15  Yes Hoyt Koch, MD  furosemide (LASIX) 20 MG tablet Take 20 mg by mouth daily as needed for fluid.  06/28/15 06/27/16 Yes Historical Provider, MD  IRON PO Take 1 tablet by mouth daily. Pt uses Vitamin Code brand   Yes Historical Provider, MD  loratadine (CLARITIN) 10 MG tablet Take 10 mg by mouth daily.   Yes Historical Provider, MD  losartan (COZAAR) 25 MG tablet Take 25 mg by mouth daily.   Yes Historical Provider, MD  omeprazole (PRILOSEC) 40 MG capsule Take 1 capsule (40 mg total) by mouth 2 (two) times daily. Patient taking differently: Take 40 mg by mouth daily.  06/14/15  Yes Paula Dragon, MD  OPSUMIT 10 MG TABS Take 1 tablet by mouth daily.  08/16/15  Yes Historical Provider, MD  Pyridoxine HCl (VITAMIN B6 PO) Take 1 tablet by mouth daily.    Yes Historical Provider, MD  calcium citrate-vitamin D 200-200 MG-UNIT TABS Take 1 tablet by mouth daily.    Historical Provider, MD    Physical Exam: Filed Vitals:   12/20/15 2000 12/20/15 2020 12/20/15 2033 12/20/15 2149  BP: 122/94 128/56 116/44   Pulse: 89 94 92   Temp:   98.1 F (36.7 C) 98.2 F (36.8 C)  TempSrc:   Oral Oral  Resp: _0 Height:      Weight:      SpO2: 93% 98% 98%      General:  Moderately built and nourished.  Eyes: Anicteric mild pallor.  ENT: No discharge from the ears eyes nose or mouth.  Neck: No mass felt.  Cardiovascular: S1 and S2 heard.  Respiratory: No rhonchi or crepitations.  Abdomen: Soft nontender bowel sounds present.  Skin: No rash.  Musculoskeletal: No edema.  Psychiatric: Appears normal.  Neurologic: Alert awake oriented to time place and person. Moves all activities.  Labs on Admission:  Basic Metabolic Panel:  Recent Labs Lab 12/18/15 0913 12/20/15 1820  NA 139 136  K 4.6 4.4  CL 103 105  CO2 28 24   GLUCOSE 94 111*  BUN 37* 62*  CREATININE 1.20 1.59*  CALCIUM 9.2 8.7*   Liver Function Tests:  Recent Labs Lab 12/18/15 0913 12/20/15 1820  AST 21 21  ALT 10 13*  ALKPHOS 37* 33*  BILITOT 0.5 0.4  PROT 6.6 5.9*  ALBUMIN 4.1 3.8    Recent Labs Lab 12/20/15 1825  LIPASE 36   No results for input(s): AMMONIA in the last 168 hours. CBC:  Recent Labs Lab 12/18/15 0913 12/20/15 1820  WBC 9.8 9.0  HGB 9.2* 6.0*  HCT 27.8*  18.7*  MCV 98.5 103.3*  PLT 211.0 176   Cardiac Enzymes: No results for input(s): CKTOTAL, CKMB, CKMBINDEX, TROPONINI in the last 168 hours.  BNP (last 3 results)  Recent Labs  12/20/15 1820  BNP 470.4*    ProBNP (last 3 results) No results for input(s): PROBNP in the last 8760 hours.  CBG: No results for input(s): GLUCAP in the last 168 hours.  Radiological Exams on Admission: Dg Chest 2 View  12/20/2015  CLINICAL DATA:  Epigastric pain with blood in stools x 4 days with lightheadedness. Pt has hx of PE, GI Bleed, HTN, Breast lumpectomy, Nonsmoker. EXAM: CHEST  2 VIEW COMPARISON:  12/14/2014 FINDINGS: Marked enlargement of the cardiopericardial silhouette, stable. No mediastinal or hilar masses or evidence of adenopathy. Prominent bronchovascular and interstitial markings are stable from the prior study. No evidence of pneumonia or pulmonary edema. No pleural effusion or pneumothorax. Stable changes from left breast surgery. Bony thorax is demineralized but intact. IMPRESSION: No acute cardiopulmonary disease.  Marked cardiomegaly. Electronically Signed   By: Lajean Manes M.D.   On: 12/20/2015 20:34    EKG: Independently reviewed. Normal sinus rhythm.  Assessment/Plan Principal Problem:   Acute GI bleeding Active Problems:   Pulmonary hypertension ([facility])   Angiodysplasia of stomach and duodenum   Renal cell carcinoma ([facility])   Acute blood loss anemia   1. Acute GI bleeding - most likely from gastric AVMs. Patient does have epigastric  pain. Differentials include gastritis and gastric ulcer. Patient has been placed on Protonix infusion. I have ordered 2 units of packed red blood cell transfusion. Consult Dr. Silverio Ward gastroenterologist in a.m. Patient will be kept nothing by mouth in anticipation of procedure. 2. Pulmonary hypertension - for now we are holding all patient's Lasix and Cozaar. Continue Cardizem. Closely monitor blood pressure trends and respiratory status. 3. Acute renal failure - probably from dehydration and anemia. Holding of Lasix and Cozaar for now. Patient is receiving blood transfusion. Check metabolic panel after transfusion. Closely follow intake output metabolic panel. 4. History of CREST syndrome. 5. Acute blood loss anemia.   DVT Prophylaxis SCDs.  Code Status: DO NOT INTUBATE.  Family Communication: Discussed with patient.  Disposition Plan: Admit to inpatient.    Rachel Samples N. [facility] Pager [phone].  If 7PM-7AM, please contact night-coverage [email or web address] Password [facility] 12/20/2015, 10:33 PM

## 2015-12-21 ENCOUNTER — Inpatient Hospital Stay (HOSPITAL_COMMUNITY): Payer: Medicare Other | Admitting: Anesthesiology

## 2015-12-21 ENCOUNTER — Encounter (HOSPITAL_COMMUNITY)
Admission: EM | Disposition: A | Discharge status: Home / Self Care | Payer: Self-pay | Source: Home / Self Care | Attending: Internal Medicine

## 2015-12-21 ENCOUNTER — Encounter (HOSPITAL_COMMUNITY): Payer: Self-pay | Admitting: Internal Medicine

## 2015-12-21 DIAGNOSIS — K922 Gastrointestinal hemorrhage, unspecified: Secondary | ICD-10-CM

## 2015-12-21 DIAGNOSIS — D62 Acute posthemorrhagic anemia: Secondary | ICD-10-CM

## 2015-12-21 DIAGNOSIS — K31819 Angiodysplasia of stomach and duodenum without bleeding: Secondary | ICD-10-CM

## 2015-12-21 DIAGNOSIS — K921 Melena: Secondary | ICD-10-CM

## 2015-12-21 HISTORY — PX: ESOPHAGOGASTRODUODENOSCOPY (EGD) WITH PROPOFOL: SHX5813

## 2015-12-21 LAB — CBC
HCT: 22.2 % — ABNORMAL LOW (ref 36.0–46.0)
HEMOGLOBIN: 7.4 g/dL — AB (ref 12.0–15.0)
MCH: 32.2 pg (ref 26.0–34.0)
MCHC: 33.3 g/dL (ref 30.0–36.0)
MCV: 96.5 fL (ref 78.0–100.0)
PLATELETS: 140 10*3/uL — AB (ref 150–400)
RBC: 2.3 MIL/uL — AB (ref 3.87–5.11)
RDW: 17.9 % — ABNORMAL HIGH (ref 11.5–15.5)
WBC: 7.4 10*3/uL (ref 4.0–10.5)

## 2015-12-21 LAB — BASIC METABOLIC PANEL
ANION GAP: 7 (ref 5–15)
BUN: 63 mg/dL — ABNORMAL HIGH (ref 6–20)
CALCIUM: 8.6 mg/dL — AB (ref 8.9–10.3)
CHLORIDE: 110 mmol/L (ref 101–111)
CO2: 26 mmol/L (ref 22–32)
CREATININE: 1.28 mg/dL — AB (ref 0.44–1.00)
GFR calc non Af Amer: 39 mL/min — ABNORMAL LOW (ref >60)
GFR, EST AFRICAN AMERICAN: 46 mL/min — AB (ref >60)
Glucose, Bld: 95 mg/dL (ref 65–99)
Potassium: 4.5 mmol/L (ref 3.5–5.1)
SODIUM: 143 mmol/L (ref 135–145)

## 2015-12-21 LAB — GLUCOSE, CAPILLARY
GLUCOSE-CAPILLARY: 107 mg/dL — AB (ref 65–99)
GLUCOSE-CAPILLARY: 71 mg/dL (ref 65–99)
GLUCOSE-CAPILLARY: 85 mg/dL (ref 65–99)
GLUCOSE-CAPILLARY: 91 mg/dL (ref 65–99)
Glucose-Capillary: 91 mg/dL (ref 65–99)
Glucose-Capillary: 81 mg/dL (ref 65–99)

## 2015-12-21 LAB — MRSA PCR SCREENING: MRSA by PCR: NEGATIVE

## 2015-12-21 LAB — PREPARE RBC (CROSSMATCH)

## 2015-12-21 SURGERY — ESOPHAGOGASTRODUODENOSCOPY (EGD) WITH PROPOFOL
Anesthesia: Monitor Anesthesia Care

## 2015-12-21 MED ORDER — SODIUM CHLORIDE 0.9 % IV SOLN
Freq: Once | INTRAVENOUS | Status: AC
  Administered 2015-12-21: 21:00:00 via INTRAVENOUS

## 2015-12-21 MED ORDER — SODIUM CHLORIDE 0.9 % IV SOLN: INTRAVENOUS | Status: DC

## 2015-12-21 MED ORDER — PHENYLEPHRINE HCL 10 MG/ML IJ SOLN
INTRAMUSCULAR | Status: DC | PRN
  Administered 2015-12-21: 80 ug via INTRAVENOUS

## 2015-12-21 MED ORDER — SODIUM CHLORIDE 0.9 % IV SOLN
INTRAVENOUS | Status: DC | PRN
  Administered 2015-12-21: 13:00:00 via INTRAVENOUS

## 2015-12-21 MED ORDER — ALBUTEROL SULFATE (2.5 MG/3ML) 0.083% IN NEBU: 2.5000 mg | INHALATION_SOLUTION | RESPIRATORY_TRACT | Status: DC | PRN

## 2015-12-21 MED ORDER — KCL IN DEXTROSE-NACL 20-5-0.9 MEQ/L-%-% IV SOLN
INTRAVENOUS | Status: AC
  Administered 2015-12-21: 12:00:00 via INTRAVENOUS
  Filled 2015-12-21: qty 1000

## 2015-12-21 MED ORDER — DEXTROSE 5 % IV SOLN
500.0000 mg | INTRAVENOUS | Status: DC
  Filled 2015-12-21: qty 500

## 2015-12-21 MED ORDER — PROPOFOL 10 MG/ML IV BOLUS
INTRAVENOUS | Status: DC | PRN
  Administered 2015-12-21 (×2): 50 mg via INTRAVENOUS

## 2015-12-21 MED ORDER — PHENYLEPHRINE 40 MCG/ML (10ML) SYRINGE FOR IV PUSH (FOR BLOOD PRESSURE SUPPORT)
PREFILLED_SYRINGE | INTRAVENOUS | Status: AC
  Filled 2015-12-21: qty 10

## 2015-12-21 MED ORDER — GUAIFENESIN ER 600 MG PO TB12
1200.0000 mg | ORAL_TABLET | Freq: Two times a day (BID) | ORAL | Status: DC
  Administered 2015-12-21 – 2015-12-23 (×5): 1200 mg via ORAL
  Filled 2015-12-21 (×5): qty 2

## 2015-12-21 MED ORDER — CEFTRIAXONE SODIUM 1 G IJ SOLR
1.0000 g | INTRAMUSCULAR | Status: DC
  Administered 2015-12-21: 1 g via INTRAVENOUS
  Filled 2015-12-21: qty 10

## 2015-12-21 MED ORDER — DOXYCYCLINE HYCLATE 100 MG IV SOLR
100.0000 mg | Freq: Two times a day (BID) | INTRAVENOUS | Status: DC
  Administered 2015-12-21 (×2): 100 mg via INTRAVENOUS
  Filled 2015-12-21 (×3): qty 100

## 2015-12-21 MED ORDER — CETYLPYRIDINIUM CHLORIDE 0.05 % MT LIQD
7.0000 mL | Freq: Two times a day (BID) | OROMUCOSAL | Status: DC
  Administered 2015-12-21 – 2015-12-23 (×5): 7 mL via OROMUCOSAL

## 2015-12-21 MED ORDER — PROPOFOL 10 MG/ML IV BOLUS
INTRAVENOUS | Status: AC
  Filled 2015-12-21: qty 20

## 2015-12-21 SURGICAL SUPPLY — 15 items

## 2015-12-21 NOTE — Transfer of Care (Signed)
Immediate Anesthesia Transfer of Care Note  Patient: Paula Ward  Procedure(s) Performed: Procedure(s): ESOPHAGOGASTRODUODENOSCOPY (EGD) WITH PROPOFOL (N/A)  Patient Location: PACU  Anesthesia Type:MAC  Level of Consciousness: awake, alert  and oriented  Airway & Oxygen Therapy: Patient Spontanous Breathing and Patient connected to nasal cannula oxygen  Post-op Assessment: Report given to RN and Post -op Vital signs reviewed and stable  Post vital signs: Reviewed and stable  Last Vitals:  Filed Vitals:   12/21/15 1210 12/21/15 1313  BP:  104/38  Pulse:  78  Temp: 36.9 C 37 C  Resp:  20    Complications: No apparent anesthesia complications

## 2015-12-21 NOTE — Consult Note (Signed)
Referring Provider: No ref. provider found Primary Care Physician:  Paula Koch, MD Primary Gastroenterologist:  Dr. Silverio Decamp   Reason for Consultation:  GI bleeding  HPI: Paula Ward is a 80 y.o. female with history of gastric and small bowel AVMs with some chronic anemia in part thought to be related to slow blood loss from that, crest syndrome, pulmonary hypertension (followed at [facility] but not on O2) present to the ER because of gastrointestinal bleeding.  She says that on Monday she developed some epigastric abdominal pain that she thought was related to her hiatal hernia.  Then she started passing very dark/black stools.  No frank red blood except some residue of such when flushed the toilet.  She was found to have a Hgb of 6.0 grams, which was down from 10.8 grams one month ago and 9.2 grams just 3 days ago.  Has received IV iron infusions in the past because she does not tolerate PO iron well.  Is on omeprazole 40 mg daily at home.  Denies NSAID use but admits to taking a moderate amount of alka-seltzer recently for URI symptoms.  Is on PPI gtt here currently.  Has received 2 units PRBC's with only mild increase in Hgb to 7.4 grams.  Last colonoscopy 06/2014 by Dr. Olevia Perches showed diverticulosis throughout the colon, more severe on the left/sigmoid colon, one polyp that was a tubular adenoma, no AVM's.  Last EGD 04/2013 by Dr. Olevia Perches with no AVM's noted.  She did have esophageal candidiasis and questionable Barrett's esophagus (biopsies negative for Barrett's but had biopsies positive for Barrett's in 2011).  Past Medical History  Diagnosis Date  . Anemia   . Pulmonary embolus ([place]) 2003  . CREST syndrome ([place])   . GERD (gastroesophageal reflux disease)     w/ HH  . Angiodysplasia of stomach and duodenum   . Arthus phenomenon   . Pulmonary hypertension ([place])     followed by Dr Gaynell Face at [facility]  . Interstitial lung disease ([place])   . Scleroderma ([place])   . Rectal incontinence    . AVM (arteriovenous malformation)   . Nodule of kidney   . Uterine polyp   . Chronic kidney disease     Chronic mild renal insuffiency  . Breast cancer ([place]) 1989    Left  . Renal cell carcinoma ([place])   . Candida esophagitis ([place])   . Cataract   . Tubular adenoma of colon   . Blood transfusion without reported diagnosis     Past Surgical History  Procedure Laterality Date  . Tonsillectomy    . Appendectomy  1962  . Breast lumpectomy  1989    left  . Ivc filter    . Kidney surgery      left   . Cataract extraction, bilateral Bilateral 12/2013    Prior to Admission medications   Medication Sig Start Date End Date Taking? Authorizing Provider  acetaminophen (TYLENOL) 325 MG tablet Take 650 mg by mouth every 6 (six) hours as needed for mild pain.    Yes Historical Provider, MD  albuterol (PROVENTIL HFA;VENTOLIN HFA) 108 (90 BASE) MCG/ACT inhaler Inhale 2 puffs into the lungs every 6 (six) hours as needed for wheezing or shortness of breath. 08/31/15  Yes Paula Koch, MD  ALPRAZolam Duanne Moron) 0.25 MG tablet Take 0.5 tablets (0.125 mg total) by mouth at bedtime as needed. Patient taking differently: Take 0.125 mg by mouth at bedtime as needed for sleep.  02/23/15  Yes Benjamine Mola  Becky Augusta, MD  chlorhexidine (PERIDEX) 0.12 % solution swish mouth with 1/2 ounce for 60 seconds and spit out twice daily. DO NOT EA...  (REFER TO PRESCRIPTION NOTES). 07/30/15  Yes Historical Provider, MD  conjugated estrogens (PREMARIN) vaginal cream Place vaginally daily. 02/23/15  Yes Paula Koch, MD  Cyanocobalamin (VITAMIN B-12 PO) Take 1 tablet by mouth daily.    Yes Historical Provider, MD  diltiazem (CARDIZEM CD) 120 MG 24 hr capsule Take 120 mg by mouth daily.     Yes Historical Provider, MD  fluticasone (FLONASE) 50 MCG/ACT nasal spray instill 1 spray into each nostril twice a day if needed 08/31/15  Yes Paula Koch, MD  furosemide (LASIX) 20 MG tablet Take 20 mg by mouth  daily as needed for fluid.  06/28/15 06/27/16 Yes Historical Provider, MD  IRON PO Take 1 tablet by mouth daily. Pt uses Vitamin Code brand   Yes Historical Provider, MD  loratadine (CLARITIN) 10 MG tablet Take 10 mg by mouth daily.   Yes Historical Provider, MD  losartan (COZAAR) 25 MG tablet Take 25 mg by mouth daily.   Yes Historical Provider, MD  omeprazole (PRILOSEC) 40 MG capsule Take 1 capsule (40 mg total) by mouth 2 (two) times daily. Patient taking differently: Take 40 mg by mouth daily.  06/14/15  Yes Lafayette Dragon, MD  OPSUMIT 10 MG TABS Take 1 tablet by mouth daily.  08/16/15  Yes Historical Provider, MD  Pyridoxine HCl (VITAMIN B6 PO) Take 1 tablet by mouth daily.    Yes Historical Provider, MD  calcium citrate-vitamin D 200-200 MG-UNIT TABS Take 1 tablet by mouth daily.    Historical Provider, MD    Current Facility-Administered Medications  Medication Dose Route Frequency Provider Last Rate Last Dose  . 0.9 %  sodium chloride infusion   Intravenous Once Rise Patience, MD      . 0.9 %  sodium chloride infusion   Intravenous Continuous Rise Patience, MD      . acetaminophen (TYLENOL) tablet 650 mg  650 mg Oral Q6H PRN Rise Patience, MD       Or  . acetaminophen (TYLENOL) suppository 650 mg  650 mg Rectal Q6H PRN Rise Patience, MD      . albuterol (PROVENTIL) (2.5 MG/3ML) 0.083% nebulizer solution 2.5 mg  2.5 mg Nebulization Q2H PRN Simbiso Ranga, MD      . ALPRAZolam Duanne Moron) tablet 0.125 mg  0.125 mg Oral QHS PRN Rise Patience, MD      . antiseptic oral rinse (CPC / CETYLPYRIDINIUM CHLORIDE 0.05%) solution 7 mL  7 mL Mouth Rinse BID Rise Patience, MD      . diltiazem (CARDIZEM CD) 24 hr capsule 120 mg  120 mg Oral Daily Rise Patience, MD      . fluticasone (FLONASE) 50 MCG/ACT nasal spray 2 spray  2 spray Each Nare Daily Rise Patience, MD      . guaiFENesin (MUCINEX) 12 hr tablet 1,200 mg  1,200 mg Oral BID Simbiso Ranga, MD      .  ondansetron (ZOFRAN) tablet 4 mg  4 mg Oral Q6H PRN Rise Patience, MD       Or  . ondansetron (ZOFRAN) injection 4 mg  4 mg Intravenous Q6H PRN Rise Patience, MD      . pantoprazole (PROTONIX) 80 mg in sodium chloride 0.9 % 250 mL (0.32 mg/mL) infusion  8 mg/hr Intravenous Continuous Rise Patience,  MD 25 mL/hr at 12/20/15 2319 8 mg/hr at 12/20/15 2319  . [START ON 12/24/2015] pantoprazole (PROTONIX) injection 40 mg  40 mg Intravenous Q12H Rise Patience, MD        Allergies as of 12/20/2015 - Review Complete 12/20/2015  Allergen Reaction Noted  . Codeine Nausea Only 08/03/2008  . Lisinopril Other (See Comments) 12/14/2014  . Other Nausea And Vomiting 04/05/2012    Family History  Problem Relation Age of Onset  . Bladder Cancer Father   . Diabetes Father   . Prostate cancer Father   . Alzheimer's disease Mother   . Colon cancer Neg Hx   . Lung cancer Sister   . Diabetes Sister   . Lung cancer Sister      smoker  . Esophageal cancer Paternal Uncle     Social History   Social History  . Marital Status: Married    Spouse Name: N/A  . Number of Children: N/A  . Years of Education: N/A   Occupational History  . Retired    Social History Main Topics  . Smoking status: Never Smoker   . Smokeless tobacco: Never Used  . Alcohol Use: No  . Drug Use: No  . Sexual Activity: No   Other Topics Concern  . Not on file   Social History Narrative   Married '61   1 son- '65, 1 daughter '63; 6 children (2 adopted)   SO- SOB   Retirement- doing well   Marriage in good health   Patient has never smoked   Alcohol use- no   Pt gets regular exercise          Review of Systems: Ten point ROS is O/W negative except as mentioned in HPI.  Physical Exam: Vital signs in last 24 hours: Temp:  [97.8 F (36.6 C)-98.2 F (36.8 C)] 98.2 F (36.8 C) (03/03 0828) Pulse Rate:  [70-102] 88 (03/03 0903) Resp:  [13-30] 26 (03/03 0903) BP: (97-128)/(26-94) 106/35  mmHg (03/03 0903) SpO2:  [91 %-100 %] 92 % (03/03 0903) Weight:  [125 lb 10.6 oz (57 kg)-127 lb (57.607 kg)] 125 lb 10.6 oz (57 kg) (03/02 2200)   General:  Aert, Well-developed, well-nourished, pleasant and cooperative in NAD Head:  Normocephalic and atraumatic. Eyes:  Sclera clear, no icterus.  Conjunctiva pale. Ears:  Normal auditory acuity. Mouth:  No deformity or lesions.   Lungs:  Clear throughout to auscultation.  No wheezes, crackles, or rhonchi.  Heart:  Regular rate and rhythm; SEM noted Abdomen:  Soft, non-distended.  BS present.  Mild epigastric TTP. Rectal:  Deferred  Msk:  Symmetrical without gross deformities. Pulses:  Normal pulses noted. Extremities:  Without clubbing or edema. Neurologic:  Alert and  oriented x4;  grossly normal neurologically. Skin:  Intact without significant lesions or rashes. Psych:  Alert and cooperative. Normal mood and affect.  Intake/Output from previous day: 03/02 0701 - 03/03 0700 In: 1120.1 [I.V.:192.1; Blood:828; IV Piggyback:100] Out: 650 [Urine:650] Intake/Output this shift: Total I/O In: -  Out: 250 [Urine:250]  Lab Results:  Recent Labs  12/20/15 1820 12/21/15 0756  WBC 9.0 7.4  HGB 6.0* 7.4*  HCT 18.7* 22.2*  PLT 176 140*   BMET  Recent Labs  12/20/15 1820 12/21/15 0756  NA 136 143  K 4.4 4.5  CL 105 110  CO2 24 26  GLUCOSE 111* 95  BUN 62* 63*  CREATININE 1.59* 1.28*  CALCIUM 8.7* 8.6*   LFT  Recent Labs  12/20/15 1820  PROT 5.9*  ALBUMIN 3.8  AST 21  ALT 13*  ALKPHOS 33*  BILITOT 0.4   Studies/Results: Dg Chest 2 View  12/20/2015  CLINICAL DATA:  Epigastric pain with blood in stools x 4 days with lightheadedness. Pt has hx of PE, GI Bleed, HTN, Breast lumpectomy, Nonsmoker. EXAM: CHEST  2 VIEW COMPARISON:  12/14/2014 FINDINGS: Marked enlargement of the cardiopericardial silhouette, stable. No mediastinal or hilar masses or evidence of adenopathy. Prominent bronchovascular and interstitial  markings are stable from the prior study. No evidence of pneumonia or pulmonary edema. No pleural effusion or pneumothorax. Stable changes from left breast surgery. Bony thorax is demineralized but intact. IMPRESSION: No acute cardiopulmonary disease.  Marked cardiomegaly. Electronically Signed   By: Lajean Manes M.D.   On: 12/20/2015 20:34   IMPRESSION:  -80 year old female with history of gastric and small bowel AVM's and chronic anemia suspected to be in part from chronic slow GI blood loss from those.  Presented with 3 day history of epigastric pain and very dark/black stools in the setting of recent Alka-Seltzer use.  Hgb down almost 5 grams from 1 month ago.  Received 2 units PRBC's with only small increase in Hgb to 7.4 grams.  Is on PPI gtt. -Pulmonary HTN:  Not on O2.  PLAN: -EGD later today. -Continue PPI gtt. -Monitor Hgb and would possible consider giving another unit PRBC's.  ZEHR, JESSICA D.  12/21/2015, 9:17 AM  Pager number [phone]  GI ATTENDING  History, laboratories, prior endoscopy reports reviewed. Patient personally seen and examined. Agree with comprehensive consultation note as outlined above. Pleasant elderly female with multiple significant medical problems. Has had recurrent GI bleeding secondary to AVMs. Presents now with slow upper GI bleeding of 4 days' duration and interval drop in hemoglobin. Hemodynamically stable. No other active problems. For upper endoscopy today. Patient is high-risk given her age and comorbidities.The nature of the procedure, as well as the risks, benefits, and alternatives were carefully and thoroughly reviewed with the patient. Ample time for discussion and questions allowed. The patient understood, was satisfied, and agreed to proceed.  Docia Chuck. Geri Seminole., M.D. [facility] Division of Gastroenterology

## 2015-12-21 NOTE — Anesthesia Preprocedure Evaluation (Addendum)
Anesthesia Evaluation  Patient identified by MRN, date of birth, ID band Patient awake    Reviewed: Allergy & Precautions, NPO status , Patient's Chart, lab work & pertinent test results  History of Anesthesia Complications (+) PONV and history of anesthetic complications  Airway Mallampati: II  TM Distance: >3 FB Neck ROM: Full    Dental  (+) Dental Advisory Given, Caps, Implants   Pulmonary  Pulmonary hypertension,; interstitial lung disease   Pulmonary exam normal breath sounds clear to auscultation       Cardiovascular hypertension, Pt. on medications  Rhythm:Regular Rate:Normal + Systolic murmurs Echo 03/9805: NORMAL LEFT VENTRICULAR SYSTOLIC FUNCTION WITH MILD LVH NORMAL RIGHT VENTRICULAR SYSTOLIC FUNCTIONVALVULAR REGURGITATION: TRIVIAL MR, TRIVIAL PR, MILD TRNO VALVULAR STENOSIS LARGE PERICARDIAL EFFUSION (See above) ESTIMATED RVSP=20mHG LARGE PERICARDIAL EFFUSION WITH NO RESPIRATORY FLOW VARIATION AND NORMALIVC    Neuro/Psych negative neurological ROS     GI/Hepatic Neg liver ROS, GERD  Medicated,Gastric AVMs   Endo/Other  negative endocrine ROS  Renal/GU Renal InsufficiencyRenal disease     Musculoskeletal  (+) Arthritis , Osteoarthritis,    Abdominal   Peds  Hematology  (+) Blood dyscrasia, anemia ,   Anesthesia Other Findings Day of surgery medications reviewed with the patient.  CREST syndrome   Reproductive/Obstetrics                          Anesthesia Physical Anesthesia Plan  ASA: IV  Anesthesia Plan: MAC   Post-op Pain Management:    Induction: Intravenous  Airway Management Planned: Nasal Cannula  Additional Equipment:   Intra-op Plan:   Post-operative Plan:   Informed Consent: I have reviewed the patients History and Physical, chart, labs and discussed the procedure including the risks, benefits and alternatives for the proposed anesthesia with the  patient or authorized representative who has indicated his/her understanding and acceptance.   Dental advisory given  Plan Discussed with: CRNA and Anesthesiologist  Anesthesia Plan Comments: (Discussed risks/benefits/alternatives to MAC sedation including need for ventilatory support, hypotension, need for conversion to general anesthesia.  All patient questions answered.  Patient wished to proceed.)        Anesthesia Quick Evaluation

## 2015-12-21 NOTE — Progress Notes (Signed)
[facility] PROGRESS NOTE  Paula Ward LDJ:[id number] DOB: [birth year] DOA: 12/20/2015 PCP: Hoyt Koch, MD  Summary 12/21/15: I have seen and examined Paula Ward at bedside, reviewed her chart and consulted GI. Appreciate help. Paula Ward is a 80 y.o. female with history of gastric AVMs, crest syndrome, pulmonary hypertension who presented to the ER because of rectal bleeding for  4 days culminating in dizziness therefore coming to the emergency department. She has also had a cough for the past 2 weeks. Patient has acute upper GI bleeding with acute blood loss anemia and Acute bronchitis. She has cough productive of yellow sputum. She has received 2 units PRBC with improvement in hemoglobin to 7.4 g/dl. Will continue PPI, follow GI recommendations, give Ceftriaxone/Doxycycline/Mucinex, and transfuse another unit of PRBC. Patient otherwise complains of cough. Plan Acute GI bleeding/Acute blood loss anemia/Angiodysplasia of stomach and duodenum  Continue PPI  Transfuse another unit PRBC  Follow GI recommendations Acute bronchitis  Mucinex/ceftriaxone/doxycycline Pulmonary hypertension (HCC)/Renal cell carcinoma (HCC)  Followed at [facility]     Code Status: Partial Family Communication: Patient at bedside Disposition Plan: ?Home this weekend   Consultants:  GI  Procedures:  EGD  Antibiotics:  Ceftriaxone 12/21/15>>  Doxcycline  HPI/Subjective: Coughing.  Objective: Filed Vitals:   12/21/15 0600 12/21/15 0700  BP: 113/49 125/35  Pulse: 93 80  Temp:    Resp: 24 15    Intake/Output Summary (Last 24 hours) at 12/21/15 0826 Last data filed at 12/21/15 0700  Gross per 24 hour  Intake 1120.08 ml  Output    650 ml  Net 470.08 ml   Filed Weights   12/20/15 1654 12/20/15 2200  Weight: 57.607 kg (127 lb) 57 kg (125 lb 10.6 oz)    Exam:   General:  Comfortable at rest.  Cardiovascular: S1-S2 normal. No murmurs. Pulse regular.  Respiratory: Good  air entry bilaterally. No rhonchi or rales.  Abdomen: Soft and nontender. Normal bowel sounds. No organomegaly.  Musculoskeletal: No pedal edema   Neurological: Intact  Data Reviewed: Basic Metabolic Panel:  Recent Labs Lab 12/18/15 0913 12/20/15 1820  NA 139 136  K 4.6 4.4  CL 103 105  CO2 28 24  GLUCOSE 94 111*  BUN 37* 62*  CREATININE 1.20 1.59*  CALCIUM 9.2 8.7*   Liver Function Tests:  Recent Labs Lab 12/18/15 0913 12/20/15 1820  AST 21 21  ALT 10 13*  ALKPHOS 37* 33*  BILITOT 0.5 0.4  PROT 6.6 5.9*  ALBUMIN 4.1 3.8    Recent Labs Lab 12/20/15 1825  LIPASE 36   No results for input(s): AMMONIA in the last 168 hours. CBC:  Recent Labs Lab 12/18/15 0913 12/20/15 1820 12/21/15 0756  WBC 9.8 9.0 7.4  HGB 9.2* 6.0* 7.4*  HCT 27.8* 18.7* 22.2*  MCV 98.5 103.3* 96.5  PLT 211.0 176 140*   Cardiac Enzymes: No results for input(s): CKTOTAL, CKMB, CKMBINDEX, TROPONINI in the last 168 hours. BNP (last 3 results)  Recent Labs  12/20/15 1820  BNP 470.4*    ProBNP (last 3 results) No results for input(s): PROBNP in the last 8760 hours.  CBG:  Recent Labs Lab 12/21/15 0031 12/21/15 0349 12/21/15 0755  GLUCAP 107* 91 85    Recent Results (from the past 240 hour(s))  MRSA PCR Screening     Status: None   Collection Time: 12/20/15  9:45 PM  Result Value Ref Range Status   MRSA by PCR NEGATIVE NEGATIVE Final    Comment:  The GeneXpert MRSA Assay (FDA approved for NASAL specimens only), is one component of a comprehensive MRSA colonization surveillance program. It is not intended to diagnose MRSA infection nor to guide or monitor treatment for MRSA infections.      Studies: Dg Chest 2 View  12/20/2015  CLINICAL DATA:  Epigastric pain with blood in stools x 4 days with lightheadedness. Pt has hx of PE, GI Bleed, HTN, Breast lumpectomy, Nonsmoker. EXAM: CHEST  2 VIEW COMPARISON:  12/14/2014 FINDINGS: Marked enlargement of the  cardiopericardial silhouette, stable. No mediastinal or hilar masses or evidence of adenopathy. Prominent bronchovascular and interstitial markings are stable from the prior study. No evidence of pneumonia or pulmonary edema. No pleural effusion or pneumothorax. Stable changes from left breast surgery. Bony thorax is demineralized but intact. IMPRESSION: No acute cardiopulmonary disease.  Marked cardiomegaly. Electronically Signed   By: Lajean Manes M.D.   On: 12/20/2015 20:34    Scheduled Meds: . sodium chloride   Intravenous Once  . antiseptic oral rinse  7 mL Mouth Rinse BID  . diltiazem  120 mg Oral Daily  . fluticasone  2 spray Each Nare Daily  . [START ON 12/24/2015] pantoprazole (PROTONIX) IV  40 mg Intravenous Q12H   Continuous Infusions: . sodium chloride    . pantoprozole (PROTONIX) infusion 8 mg/hr (12/20/15 2319)     Time spent: 25 minutes    Phill Steck  [facility] Pager [phone]. If 7PM-7AM, please contact night-coverage at [email or web address], password [facility] 12/21/2015, 8:26 AM  LOS: 1 day

## 2015-12-21 NOTE — Care Management Note (Signed)
Case Management Note  Patient Details  Name: FAIGE SEELY MRN: [id number] Date of Birth: 1935/11/27  Subjective/Objective:         Rectal bleeding and lower gi bleed with anemia           Action/Plan:Date:  December 21, 2015 Chart reviewed for concurrent status and case management needs. Will continue to follow patient for changes and needs: Velva Harman, BSN, RN, [place]   [phone]   Expected Discharge Date:                  Expected Discharge Plan:  Home/Self Care  In-House Referral:  NA  Discharge planning Services  CM Consult  Post Acute Care Choice:  NA Choice offered to:  NA  DME Arranged:    DME Agency:     HH Arranged:    HH Agency:     Status of Service:  In process, will continue to follow  Medicare Important Message Given:    Date Medicare IM Given:    Medicare IM give by:    Date Additional Medicare IM Given:    Additional Medicare Important Message give by:     If discussed at [place] of Stay Meetings, dates discussed:    Additional Comments:  Leeroy Cha, RN 12/21/2015, 10:54 AM

## 2015-12-21 NOTE — Anesthesia Postprocedure Evaluation (Signed)
Anesthesia Post Note  Patient: Paula Ward  Procedure(s) Performed: Procedure(s) (LRB): ESOPHAGOGASTRODUODENOSCOPY (EGD) WITH PROPOFOL (N/A)  Patient location during evaluation: PACU Anesthesia Type: MAC Level of consciousness: awake and alert Pain management: pain level controlled Vital Signs Assessment: post-procedure vital signs reviewed and stable Respiratory status: spontaneous breathing, nonlabored ventilation, respiratory function stable and patient connected to nasal cannula oxygen Cardiovascular status: stable and blood pressure returned to baseline Anesthetic complications: no    Last Vitals:  Filed Vitals:   12/21/15 1313 12/21/15 1427  BP: 104/38 108/33  Pulse: 78 80  Temp: 37 C 36.6 C  Resp: 20 30    Last Pain:  Filed Vitals:   12/21/15 1428  PainSc: 0-No pain                 Catalina Gravel

## 2015-12-21 NOTE — Op Note (Signed)
[facility] [place], [address]   ENDOSCOPY PROCEDURE REPORT  PATIENT: Paula Ward, Paula Ward  MR#: [id number] BIRTHDATE: 1936-06-21 , 79  yrs. old GENDER: female ENDOSCOPIST: Eustace Quail, MD REFERRED BY:  [facility] PROCEDURE DATE:  12/21/2015 PROCEDURE:  EGD w/ control of bleeding ASA CLASS:     Class III INDICATIONS:  melena. MEDICATIONS: Monitored anesthesia care and Per Anesthesia TOPICAL ANESTHETIC: none  DESCRIPTION OF PROCEDURE: After the risks benefits and alternatives of the procedure were thoroughly explained, informed consent was obtained.  The EG [id number] ( [id number] ) endoscope was introduced through the mouth and advanced to the second portion of the duodenum , Without limitations.  The instrument was slowly withdrawn as the mucosa was fully examined.  EXAM:The esophagus was normal.The stomach revealed a small nonbleeding a pyloric AVM.Remainder of the stomach was unremarkable.Gastric fundic esophagus revealed a diverticulum.The duodenal bulb revealed tiny nonbleeding AVM.Post bulbar duodenum was normal. Proximal jejunum was normal. No blood in the upper GI tract.The small gastric and duodenal AVMs were ablated with APC. Retroflexed views revealed a hiatal hernia. The scope was then withdrawn  and the procedure completed. COMPLICATIONS: There were no immediate complications.  ENDOSCOPIC IMPRESSION: 1. Small gastric and duodenal AVMs ablated with APC.No blood or active bleedingin upper gut to jejunum 2. Incidental proximal gastric diverticulum. Otherwise unremarkable exam   RECOMMENDATIONS: 1. Transfuse to clinically desired hemoglobin 2. Continue to monitor blood counts as outpatient and received iron transfusions (oral iron intolerant) as needed 3.Should patient redeveloped clinically significantGI bleeding, would recommend capsule endoscopy to rule out more distal small intestinal lesions that may requiredouble-balloon  enteroscopy at tertiary facility 4. Advance diet. If stable okay for discharge in a.m. GI will sign off. Available if needed   py  REPEAT EXAM:  eSigned:  Eustace Quail, MD 12/21/2015 2:37 PM    CC:The Patient and Pricilla Holm, MD  ; Raliegh Ip. Nadigam,MD;

## 2015-12-22 LAB — EXPECTORATED SPUTUM ASSESSMENT W GRAM STAIN, RFLX TO RESP C: Special Requests: NORMAL

## 2015-12-22 LAB — CBC WITH DIFFERENTIAL/PLATELET
Basophils Absolute: 0 10*3/uL (ref 0.0–0.1)
Basophils Relative: 0 %
EOS ABS: 0.2 10*3/uL (ref 0.0–0.7)
Eosinophils Relative: 3 %
HEMATOCRIT: 24.5 % — AB (ref 36.0–46.0)
HEMOGLOBIN: 8.1 g/dL — AB (ref 12.0–15.0)
LYMPHS ABS: 1.8 10*3/uL (ref 0.7–4.0)
LYMPHS PCT: 21 %
MCH: 31 pg (ref 26.0–34.0)
MCHC: 33.1 g/dL (ref 30.0–36.0)
MCV: 93.9 fL (ref 78.0–100.0)
Monocytes Absolute: 0.5 10*3/uL (ref 0.1–1.0)
Monocytes Relative: 6 %
NEUTROS ABS: 6.1 10*3/uL (ref 1.7–7.7)
NEUTROS PCT: 70 %
Platelets: 141 10*3/uL — ABNORMAL LOW (ref 150–400)
RBC: 2.61 MIL/uL — AB (ref 3.87–5.11)
RDW: 19.3 % — ABNORMAL HIGH (ref 11.5–15.5)
WBC: 8.6 10*3/uL (ref 4.0–10.5)

## 2015-12-22 LAB — COMPREHENSIVE METABOLIC PANEL
ALK PHOS: 31 U/L — AB (ref 38–126)
ALT: 12 U/L — AB (ref 14–54)
AST: 21 U/L (ref 15–41)
Albumin: 3.1 g/dL — ABNORMAL LOW (ref 3.5–5.0)
Anion gap: 6 (ref 5–15)
BILIRUBIN TOTAL: 0.7 mg/dL (ref 0.3–1.2)
BUN: 44 mg/dL — ABNORMAL HIGH (ref 6–20)
CALCIUM: 8.5 mg/dL — AB (ref 8.9–10.3)
CO2: 24 mmol/L (ref 22–32)
CREATININE: 1.23 mg/dL — AB (ref 0.44–1.00)
Chloride: 111 mmol/L (ref 101–111)
GFR calc non Af Amer: 41 mL/min — ABNORMAL LOW (ref >60)
GFR, EST AFRICAN AMERICAN: 48 mL/min — AB (ref >60)
GLUCOSE: 103 mg/dL — AB (ref 65–99)
Potassium: 4.7 mmol/L (ref 3.5–5.1)
SODIUM: 141 mmol/L (ref 135–145)
Total Protein: 5.1 g/dL — ABNORMAL LOW (ref 6.5–8.1)

## 2015-12-22 LAB — EXPECTORATED SPUTUM ASSESSMENT W REFEX TO RESP CULTURE

## 2015-12-22 LAB — PREPARE RBC (CROSSMATCH)

## 2015-12-22 MED ORDER — PANTOPRAZOLE SODIUM 40 MG PO TBEC
40.0000 mg | DELAYED_RELEASE_TABLET | Freq: Two times a day (BID) | ORAL | Status: DC
  Administered 2015-12-22 – 2015-12-23 (×3): 40 mg via ORAL
  Filled 2015-12-22 (×3): qty 1

## 2015-12-22 MED ORDER — DOXYCYCLINE HYCLATE 100 MG PO TABS
100.0000 mg | ORAL_TABLET | Freq: Two times a day (BID) | ORAL | Status: DC
  Administered 2015-12-22 – 2015-12-23 (×3): 100 mg via ORAL
  Filled 2015-12-22 (×3): qty 1

## 2015-12-22 MED ORDER — LOSARTAN POTASSIUM 25 MG PO TABS
25.0000 mg | ORAL_TABLET | Freq: Every day | ORAL | Status: DC
  Administered 2015-12-22 – 2015-12-23 (×2): 25 mg via ORAL
  Filled 2015-12-22 (×2): qty 1

## 2015-12-22 MED ORDER — FUROSEMIDE 20 MG PO TABS
20.0000 mg | ORAL_TABLET | Freq: Every day | ORAL | Status: DC
  Administered 2015-12-23: 20 mg via ORAL
  Filled 2015-12-22: qty 1

## 2015-12-22 MED ORDER — LORATADINE 10 MG PO TABS
10.0000 mg | ORAL_TABLET | Freq: Every day | ORAL | Status: DC
  Administered 2015-12-22 – 2015-12-23 (×2): 10 mg via ORAL
  Filled 2015-12-22 (×2): qty 1

## 2015-12-22 MED ORDER — MACITENTAN 10 MG PO TABS
1.0000 | ORAL_TABLET | Freq: Every day | ORAL | Status: DC
  Administered 2015-12-23: 1 via ORAL

## 2015-12-22 MED ORDER — SODIUM CHLORIDE 0.9 % IV SOLN
Freq: Once | INTRAVENOUS | Status: AC
  Administered 2015-12-22: 10:00:00 via INTRAVENOUS

## 2015-12-22 NOTE — Progress Notes (Signed)
Pharmacy Note - Macitentan  Patient takes macitentan (Opsumit) PTA for PAH, with last outpatient dose 3/2.  Medication not ordered in Epic, but patient today states that  medication has been brought in and that she has been keeping it at bedside and taking it per her usual PTA schedule (this is generally prohibited per hospital policy for safety reasons).  [place] therapy has thus been uninterrupted despite not being charted in Epic.  If patient is to remain, please enter order for macitentan so that it can be documented.  RN, please bring medication to pharmacy if patient will need to be administered any further doses.   Reuel Boom, PharmD, BCPS Pager: [phone] 12/22/2015, 4:44 PM

## 2015-12-22 NOTE — Progress Notes (Signed)
[facility] PROGRESS NOTE  NILDA KEATHLEY IPP:[id number] DOB: 02-24-1936 DOA: 12/20/2015 PCP: Hoyt Koch, MD  Summary 12/21/15: I have seen and examined [name] at bedside, reviewed her chart and consulted GI. Appreciate help. MEKO BELLANGER is a 80 y.o. female with history of gastric AVMs, crest syndrome, pulmonary hypertension who presented to the ER because of rectal bleeding for 4 days culminating in dizziness therefore coming to the emergency department. She has also had a cough for the past 2 weeks. Patient has acute upper GI bleeding with acute blood loss anemia and Acute bronchitis. She has cough productive of yellow sputum. She has received 2 units PRBC with improvement in hemoglobin to 7.4 g/dl. Will continue PPI, follow GI recommendations, give Ceftriaxone/Doxycycline/Mucinex, and transfuse another unit of PRBC. Patient otherwise complains of cough. 12/22/15: Appreciate GI.  Patient status post EGD with findings of Small gastric and duodenal AVMs ablated with APC. Her hemoglobin is 8.1g/dl after transfusion of 3 units PRBC. Patient states that her hemoglobin is usually around 10 g/dl. She is still lightheaded after transfusion  this afternoon. She does not feel steady enough to go home this afternoon. Will monitor overnight and discharge home tomorrow if she feels better. Otherwise, transition antibiotics to oral. She states that her cough is better. Plan Acute GI bleeding/Acute blood loss anemia/Angiodysplasia of stomach and duodenum  Continue PPI- change protonix to oral  Follow Hb in am  Follow GI recommendations Acute bronchitis  Mucinex, d/c ceftriaxone  Change Doxycycline to oral. Pulmonary hypertension (HCC)/Renal cell carcinoma (HCC)  No acute changes  Followed at [facility]  Code Status: Partial Family Communication: Patient at bedside Disposition Plan: ?Home tomorrow. Transfer to [place] [facility] when bed becomes  available   Consultants:  GI  Procedures:  EGD  Antibiotics:  Ceftriaxone 12/21/15>>12/22/15  Doxcycline 12/21/15>>  HPI/Subjective: Feels dizzy.  Objective: Filed Vitals:   12/22/15 1515 12/22/15 1600  BP: 135/45 131/49  Pulse: 99 94  Temp: 98.3 F (36.8 C)   Resp: 24 22    Intake/Output Summary (Last 24 hours) at 12/22/15 1632 Last data filed at 12/22/15 1515  Gross per 24 hour  Intake   1890 ml  Output   2350 ml  Net   -460 ml   Filed Weights   12/20/15 1654 12/20/15 2200 12/21/15 1311  Weight: 57.607 kg (127 lb) 57 kg (125 lb 10.6 oz) 56.7 kg (125 lb)    Exam:   General:  Comfortable at rest.  Cardiovascular: S1-S2 normal. No murmurs. Pulse regular.  Respiratory: Good air entry bilaterally. No rhonchi or rales.  Abdomen: Soft and nontender. Normal bowel sounds. No organomegaly.  Musculoskeletal: No pedal edema   Neurological: Intact  Data Reviewed: Basic Metabolic Panel:  Recent Labs Lab 12/18/15 0913 12/20/15 1820 12/21/15 0756 12/22/15 0346  NA 139 136 143 141  K 4.6 4.4 4.5 4.7  CL 103 105 110 111  CO2 _0 GLUCOSE 94 111* 95 103*  BUN 37* 62* 63* 44*  CREATININE 1.20 1.59* 1.28* 1.23*  CALCIUM 9.2 8.7* 8.6* 8.5*   Liver Function Tests:  Recent Labs Lab 12/18/15 0913 12/20/15 1820 12/22/15 0346  AST _1 ALT 10 13* 12*  ALKPHOS 37* 33* 31*  BILITOT 0.5 0.4 0.7  PROT 6.6 5.9* 5.1*  ALBUMIN 4.1 3.8 3.1*    Recent Labs Lab 12/20/15 1825  LIPASE 36   No results for input(s): AMMONIA in the last 168 hours. CBC:  Recent Labs Lab  12/18/15 0913 12/20/15 1820 12/21/15 0756 12/22/15 0346  WBC 9.8 9.0 7.4 8.6  NEUTROABS  --   --   --  6.1  HGB 9.2* 6.0* 7.4* 8.1*  HCT 27.8* 18.7* 22.2* 24.5*  MCV 98.5 103.3* 96.5 93.9  PLT 211.0 176 140* 141*   Cardiac Enzymes: No results for input(s): CKTOTAL, CKMB, CKMBINDEX, TROPONINI in the last 168 hours. BNP (last 3 results)  Recent Labs  12/20/15 1820  BNP  470.4*    ProBNP (last 3 results) No results for input(s): PROBNP in the last 8760 hours.  CBG:  Recent Labs Lab 12/21/15 0349 12/21/15 0755 12/21/15 1127 12/21/15 1307 12/21/15 1634  GLUCAP 91 85 71 91 81    Recent Results (from the past 240 hour(s))  MRSA PCR Screening     Status: None   Collection Time: 12/20/15  9:45 PM  Result Value Ref Range Status   MRSA by PCR NEGATIVE NEGATIVE Final    Comment:        The GeneXpert MRSA Assay (FDA approved for NASAL specimens only), is one component of a comprehensive MRSA colonization surveillance program. It is not intended to diagnose MRSA infection nor to guide or monitor treatment for MRSA infections.   Culture, expectorated sputum-assessment     Status: None   Collection Time: 12/21/15 11:00 PM  Result Value Ref Range Status   Specimen Description SPUTUM  Final   Special Requests Normal  Final   Sputum evaluation   Final    MICROSCOPIC FINDINGS SUGGEST THAT THIS SPECIMEN IS NOT REPRESENTATIVE OF LOWER RESPIRATORY SECRETIONS. PLEASE RECOLLECT. CALLED TO Toy Baker RN @ [phone] ON 56/43/32 BY C DAVIS   Report Status 12/22/2015 FINAL  Final     Studies: Dg Chest 2 View  12/20/2015  CLINICAL DATA:  Epigastric pain with blood in stools x 4 days with lightheadedness. Pt has hx of PE, GI Bleed, HTN, Breast lumpectomy, Nonsmoker. EXAM: CHEST  2 VIEW COMPARISON:  12/14/2014 FINDINGS: Marked enlargement of the cardiopericardial silhouette, stable. No mediastinal or hilar masses or evidence of adenopathy. Prominent bronchovascular and interstitial markings are stable from the prior study. No evidence of pneumonia or pulmonary edema. No pleural effusion or pneumothorax. Stable changes from left breast surgery. Bony thorax is demineralized but intact. IMPRESSION: No acute cardiopulmonary disease.  Marked cardiomegaly. Electronically Signed   By: Lajean Manes M.D.   On: 12/20/2015 20:34    Scheduled Meds: . sodium chloride   Intravenous  Once  . antiseptic oral rinse  7 mL Mouth Rinse BID  . diltiazem  120 mg Oral Daily  . doxycycline  100 mg Oral Q12H  . fluticasone  2 spray Each Nare Daily  . guaiFENesin  1,200 mg Oral BID  . pantoprazole  40 mg Oral BID   Continuous Infusions: . sodium chloride    . dextrose 5 % and 0.9 % NaCl with KCl 20 mEq/L Stopped (12/21/15 1723)     Time spent: 25 minutes    Junior Kenedy  [facility] Pager [phone]. If 7PM-7AM, please contact night-coverage at [email or web address], password [facility] 12/22/2015, 4:32 PM  LOS: 2 days

## 2015-12-23 LAB — BASIC METABOLIC PANEL
ANION GAP: 8 (ref 5–15)
BUN: 55 mg/dL — ABNORMAL HIGH (ref 6–20)
CALCIUM: 8.9 mg/dL (ref 8.9–10.3)
CHLORIDE: 112 mmol/L — AB (ref 101–111)
CO2: 23 mmol/L (ref 22–32)
Creatinine, Ser: 1.3 mg/dL — ABNORMAL HIGH (ref 0.44–1.00)
GFR calc non Af Amer: 39 mL/min — ABNORMAL LOW (ref >60)
GFR, EST AFRICAN AMERICAN: 45 mL/min — AB (ref >60)
GLUCOSE: 98 mg/dL (ref 65–99)
POTASSIUM: 4.6 mmol/L (ref 3.5–5.1)
Sodium: 143 mmol/L (ref 135–145)

## 2015-12-23 LAB — CBC
HEMATOCRIT: 27.3 % — AB (ref 36.0–46.0)
HEMOGLOBIN: 8.7 g/dL — AB (ref 12.0–15.0)
MCH: 30.5 pg (ref 26.0–34.0)
MCHC: 31.9 g/dL (ref 30.0–36.0)
MCV: 95.8 fL (ref 78.0–100.0)
Platelets: 155 10*3/uL (ref 150–400)
RBC: 2.85 MIL/uL — AB (ref 3.87–5.11)
RDW: 18.7 % — AB (ref 11.5–15.5)
WBC: 8.1 10*3/uL (ref 4.0–10.5)

## 2015-12-23 MED ORDER — GUAIFENESIN ER 600 MG PO TB12: 1200.0000 mg | ORAL_TABLET | Freq: Two times a day (BID) | ORAL | Status: DC

## 2015-12-23 MED ORDER — DOXYCYCLINE HYCLATE 100 MG PO TABS: 100.0000 mg | ORAL_TABLET | Freq: Two times a day (BID) | ORAL | Status: DC

## 2015-12-23 NOTE — Progress Notes (Signed)
Family at bedside. All materials covered and patient states understanding. IVs removed. Patient ready for discharge.

## 2015-12-23 NOTE — Care Management Important Message (Signed)
Important Message  Patient Details  Name: Paula Ward MRN: [id number] Date of Birth: 09-28-1936   Medicare Important Message Given:  Yes    Erenest Rasher, RN 12/23/2015, 1:50 PM

## 2015-12-23 NOTE — Discharge Summary (Signed)
Paula Ward, is a 80 y.o. female  DOB 18-May-1936  MRN [id number].  Admission date:  12/20/2015  Admitting Physician  Rise Patience, MD  Discharge Date:  12/23/2015   Primary MD  Hoyt Koch, MD  Recommendations for primary care physician for things to follow:  Follow hemoglobin in the next week or so.  Admission Diagnosis   Gastrointestinal hemorrhage, unspecified gastritis, unspecified gastrointestinal hemorrhage type [K92.2]   Discharge Diagnosis  Gastrointestinal hemorrhage, unspecified gastritis, unspecified gastrointestinal hemorrhage type [K92.2]   Principal Problem:   Acute GI bleeding Active Problems:   Pulmonary hypertension (HCC)   Angiodysplasia of stomach and duodenum   Renal cell carcinoma (HCC)   Acute blood loss anemia      Summary Paula Ward is a pleasant 80 year old female with history of gastric AVMs, crest syndrome, pulmonary hypertension who was admitted with rectal bleeding manifested by melena and associated with dizziness. She had an EGD performed by Dr. Henrene Pastor, with APC of bleeding AVMs. She required transfusion of 4 units PRBC as her hmoglobin dropped to 6 g/dl. The hemoglobin has improved to 8.7 g/dl posttransfusion. She also had acute bronchitis ongoing for more than 2 weeks, therefore she was placed on antibiotics/Mucinex with improvement. She will complete 5 more days of doxycycline. Please refer to the daily progress notes below for details of patient's hospital stay;  Hospital Course  12/21/15: I have seen and examined Paula Ward at bedside, reviewed her chart and consulted GI. Appreciate help. Paula Ward is a 80 y.o. female with history of gastric AVMs, crest syndrome, pulmonary hypertension who presented to the ER because of rectal bleeding for 4 days culminating  in dizziness therefore coming to the emergency department. She has also had a cough for the past 2 weeks. Patient has acute upper GI bleeding with acute blood loss anemia and Acute bronchitis. She has cough productive of yellow sputum. She has received 2 units PRBC with improvement in hemoglobin to 7.4 g/dl. Will continue PPI, follow GI recommendations, give Ceftriaxone/Doxycycline/Mucinex, and transfuse another unit of PRBC. Patient otherwise complains of cough. 12/22/15: Appreciate GI. Patient status post EGD with findings of Small gastric and duodenal AVMs ablated with APC. Her hemoglobin is 8.1g/dl after transfusion of 3 units PRBC. Patient states that her hemoglobin is usually around 10 g/dl. She is still lightheaded after transfusion this afternoon. She does not feel steady enough to go home this afternoon. Will monitor overnight and discharge home tomorrow if she feels better. Otherwise, transition antibiotics to oral. She states that her cough is better. 12/23/15: Hemoglobin has improved today 8.7 g/dl after fourth unit of PRBC. Patient feels better, still coughing but ready to go home. We'll therefore discharge patient to continue PPI and on doxycycline for 5 more days. She will follow with her gastroenterologist in the next week or 2.  Discharge Condition Stable.  Consults obtained  [facility]  Follow [facility] at [facility] PCP.   Discharge Instructions  and  Discharge Medications  Discharge Instructions  Diet - low sodium heart healthy    Complete by:  As directed      Discharge instructions    Complete by:  As directed   Follow GI     Increase activity slowly    Complete by:  As directed             Medication List    TAKE these medications        acetaminophen 325 MG tablet  Commonly known as:  TYLENOL  Take 650 mg by mouth every 6 (six) hours as needed for mild pain.     albuterol 108 (90 Base) MCG/ACT inhaler  Commonly known as:  PROVENTIL HFA;VENTOLIN HFA    Inhale 2 puffs into the lungs every 6 (six) hours as needed for wheezing or shortness of breath.     ALPRAZolam 0.25 MG tablet  Commonly known as:  XANAX  Take 0.5 tablets (0.125 mg total) by mouth at bedtime as needed.     calcium citrate-vitamin D 200-200 MG-UNIT Tabs  Take 1 tablet by mouth daily.     chlorhexidine 0.12 % solution  Commonly known as:  PERIDEX  swish mouth with 1/2 ounce for 60 seconds and spit out twice daily. DO NOT EA...  (REFER TO PRESCRIPTION NOTES).     conjugated estrogens vaginal cream  Commonly known as:  PREMARIN  Place vaginally daily.     diltiazem 120 MG 24 hr capsule  Commonly known as:  CARDIZEM CD  Take 120 mg by mouth daily.     doxycycline 100 MG tablet  Commonly known as:  VIBRA-TABS  Take 1 tablet (100 mg total) by mouth every 12 (twelve) hours.     fluticasone 50 MCG/ACT nasal spray  Commonly known as:  FLONASE  instill 1 spray into each nostril twice a day if needed     furosemide 20 MG tablet  Commonly known as:  LASIX  Take 20 mg by mouth daily as needed for fluid.     guaiFENesin 600 MG 12 hr tablet  Commonly known as:  MUCINEX  Take 2 tablets (1,200 mg total) by mouth 2 (two) times daily.     IRON PO  Take 1 tablet by mouth daily. Pt uses Vitamin Code brand     loratadine 10 MG tablet  Commonly known as:  CLARITIN  Take 10 mg by mouth daily.     losartan 25 MG tablet  Commonly known as:  COZAAR  Take 25 mg by mouth daily.     omeprazole 40 MG capsule  Commonly known as:  PRILOSEC  Take 1 capsule (40 mg total) by mouth 2 (two) times daily.     OPSUMIT 10 MG Tabs  Generic drug:  Macitentan  Take 1 tablet by mouth daily.     VITAMIN B-12 PO  Take 1 tablet by mouth daily.     VITAMIN B6 PO  Take 1 tablet by mouth daily.        Diet and Activity recommendation: See Discharge Instructions above  Major procedures and Radiology Reports - PLEASE review detailed and final reports for all details, in brief -     Dg Chest 2 View  12/20/2015  CLINICAL DATA:  Epigastric pain with blood in stools x 4 days with lightheadedness. Pt has hx of PE, GI Bleed, HTN, Breast lumpectomy, Nonsmoker. EXAM: CHEST  2 VIEW COMPARISON:  12/14/2014 FINDINGS: Marked enlargement of the cardiopericardial silhouette, stable. No mediastinal or hilar masses or evidence of adenopathy. Prominent  bronchovascular and interstitial markings are stable from the prior study. No evidence of pneumonia or pulmonary edema. No pleural effusion or pneumothorax. Stable changes from left breast surgery. Bony thorax is demineralized but intact. IMPRESSION: No acute cardiopulmonary disease.  Marked cardiomegaly. Electronically Signed   By: Lajean Manes M.D.   On: 12/20/2015 20:34    Micro Results   Recent Results (from the past 240 hour(s))  MRSA PCR Screening     Status: None   Collection Time: 12/20/15  9:45 PM  Result Value Ref Range Status   MRSA by PCR NEGATIVE NEGATIVE Final    Comment:        The GeneXpert MRSA Assay (FDA approved for NASAL specimens only), is one component of a comprehensive MRSA colonization surveillance program. It is not intended to diagnose MRSA infection nor to guide or monitor treatment for MRSA infections.   Culture, expectorated sputum-assessment     Status: None   Collection Time: 12/21/15 11:00 PM  Result Value Ref Range Status   Specimen Description SPUTUM  Final   Special Requests Normal  Final   Sputum evaluation   Final    MICROSCOPIC FINDINGS SUGGEST THAT THIS SPECIMEN IS NOT REPRESENTATIVE OF LOWER RESPIRATORY SECRETIONS. PLEASE RECOLLECT. CALLED TO Toy Baker RN @ [phone] ON 04/75/33 BY C DAVIS   Report Status 12/22/2015 FINAL  Final       Today   Subjective:   Kawena Lyday denies any complaints..   Objective:   Blood pressure 121/38, pulse 97, temperature 98 F (36.7 C), temperature source Oral, resp. rate 24, height 5' 5" (1.651 m), weight 56.7 kg (125 lb), SpO2 91  %.   Intake/Output Summary (Last 24 hours) at 12/23/15 1124 Last data filed at 12/23/15 0800  Gross per 24 hour  Intake 779.17 ml  Output    600 ml  Net 179.17 ml    Exam No acute findings.  Data Review   CBC w Diff: Lab Results  Component Value Date   WBC 8.1 12/23/2015   HGB 8.7* 12/23/2015   HCT 27.3* 12/23/2015   PLT 155 12/23/2015   LYMPHOPCT 21 12/22/2015   MONOPCT 6 12/22/2015   EOSPCT 3 12/22/2015   BASOPCT 0 12/22/2015    CMP: Lab Results  Component Value Date   NA 143 12/23/2015   K 4.6 12/23/2015   CL 112* 12/23/2015   CO2 23 12/23/2015   BUN 55* 12/23/2015   CREATININE 1.30* 12/23/2015   PROT 5.1* 12/22/2015   ALBUMIN 3.1* 12/22/2015   BILITOT 0.7 12/22/2015   ALKPHOS 31* 12/22/2015   AST 21 12/22/2015   ALT 12* 12/22/2015  .   Total Time in preparing paper work, data evaluation and todays exam - 25 minutes  Amyjo Mizrachi M.D on 12/23/2015 at 11:24 AM  [facility] Office  [phone]

## 2015-12-24 ENCOUNTER — Other Ambulatory Visit: Payer: Self-pay

## 2015-12-24 ENCOUNTER — Other Ambulatory Visit (INDEPENDENT_AMBULATORY_CARE_PROVIDER_SITE_OTHER): Payer: Medicare Other

## 2015-12-24 ENCOUNTER — Other Ambulatory Visit: Payer: Medicare Other

## 2015-12-24 ENCOUNTER — Emergency Department (HOSPITAL_COMMUNITY)
Admission: EM | Admit: 2015-12-24 | Discharge: 2015-12-24 | Disposition: A | Discharge status: Home / Self Care | Payer: Medicare Other | Attending: Emergency Medicine | Admitting: Emergency Medicine

## 2015-12-24 ENCOUNTER — Telehealth: Payer: Self-pay | Admitting: Gastroenterology

## 2015-12-24 ENCOUNTER — Telehealth: Payer: Self-pay | Admitting: Internal Medicine

## 2015-12-24 ENCOUNTER — Telehealth: Payer: Self-pay | Admitting: *Deleted

## 2015-12-24 ENCOUNTER — Encounter (HOSPITAL_COMMUNITY): Payer: Self-pay | Admitting: Internal Medicine

## 2015-12-24 DIAGNOSIS — Z79899 Other long term (current) drug therapy: Secondary | ICD-10-CM | POA: Diagnosis not present

## 2015-12-24 DIAGNOSIS — Z7951 Long term (current) use of inhaled steroids: Secondary | ICD-10-CM | POA: Diagnosis not present

## 2015-12-24 DIAGNOSIS — Z85038 Personal history of other malignant neoplasm of large intestine: Secondary | ICD-10-CM | POA: Diagnosis not present

## 2015-12-24 DIAGNOSIS — K625 Hemorrhage of anus and rectum: Secondary | ICD-10-CM | POA: Insufficient documentation

## 2015-12-24 DIAGNOSIS — Z853 Personal history of malignant neoplasm of breast: Secondary | ICD-10-CM | POA: Diagnosis not present

## 2015-12-24 DIAGNOSIS — K219 Gastro-esophageal reflux disease without esophagitis: Secondary | ICD-10-CM | POA: Diagnosis not present

## 2015-12-24 DIAGNOSIS — Z8739 Personal history of other diseases of the musculoskeletal system and connective tissue: Secondary | ICD-10-CM | POA: Insufficient documentation

## 2015-12-24 DIAGNOSIS — Z8619 Personal history of other infectious and parasitic diseases: Secondary | ICD-10-CM | POA: Insufficient documentation

## 2015-12-24 DIAGNOSIS — K922 Gastrointestinal hemorrhage, unspecified: Secondary | ICD-10-CM

## 2015-12-24 DIAGNOSIS — Z8742 Personal history of other diseases of the female genital tract: Secondary | ICD-10-CM | POA: Insufficient documentation

## 2015-12-24 DIAGNOSIS — Z86711 Personal history of pulmonary embolism: Secondary | ICD-10-CM | POA: Diagnosis not present

## 2015-12-24 DIAGNOSIS — D649 Anemia, unspecified: Secondary | ICD-10-CM | POA: Insufficient documentation

## 2015-12-24 DIAGNOSIS — R1013 Epigastric pain: Secondary | ICD-10-CM | POA: Diagnosis present

## 2015-12-24 DIAGNOSIS — N189 Chronic kidney disease, unspecified: Secondary | ICD-10-CM | POA: Diagnosis not present

## 2015-12-24 DIAGNOSIS — Z792 Long term (current) use of antibiotics: Secondary | ICD-10-CM | POA: Diagnosis not present

## 2015-12-24 DIAGNOSIS — R05 Cough: Secondary | ICD-10-CM | POA: Insufficient documentation

## 2015-12-24 LAB — COMPREHENSIVE METABOLIC PANEL
ALBUMIN: 3.8 g/dL (ref 3.5–5.0)
ALT: 16 U/L (ref 14–54)
ANION GAP: 8 (ref 5–15)
AST: 24 U/L (ref 15–41)
Alkaline Phosphatase: 40 U/L (ref 38–126)
BUN: 55 mg/dL — AB (ref 6–20)
CHLORIDE: 110 mmol/L (ref 101–111)
CO2: 22 mmol/L (ref 22–32)
Calcium: 8.8 mg/dL — ABNORMAL LOW (ref 8.9–10.3)
Creatinine, Ser: 1.62 mg/dL — ABNORMAL HIGH (ref 0.44–1.00)
GFR calc Af Amer: 34 mL/min — ABNORMAL LOW (ref >60)
GFR calc non Af Amer: 30 mL/min — ABNORMAL LOW (ref >60)
GLUCOSE: 120 mg/dL — AB (ref 65–99)
POTASSIUM: 4.3 mmol/L (ref 3.5–5.1)
SODIUM: 140 mmol/L (ref 135–145)
TOTAL PROTEIN: 6.3 g/dL — AB (ref 6.5–8.1)
Total Bilirubin: 0.5 mg/dL (ref 0.3–1.2)

## 2015-12-24 LAB — CBC WITH DIFFERENTIAL/PLATELET
Basophils Absolute: 0 10*3/uL (ref 0.0–0.1)
Basophils Relative: 0.1 % (ref 0.0–3.0)
EOS ABS: 0.1 10*3/uL (ref 0.0–0.7)
EOS PCT: 1 % (ref 0.0–5.0)
HCT: 26.9 % — ABNORMAL LOW (ref 36.0–46.0)
Hemoglobin: 9 g/dL — ABNORMAL LOW (ref 12.0–15.0)
LYMPHS ABS: 1.4 10*3/uL (ref 0.7–4.0)
Lymphocytes Relative: 13.3 % (ref 12.0–46.0)
MCHC: 33.6 g/dL (ref 30.0–36.0)
MCV: 91.3 fl (ref 78.0–100.0)
MONO ABS: 0.4 10*3/uL (ref 0.1–1.0)
Monocytes Relative: 4.2 % (ref 3.0–12.0)
NEUTROS PCT: 81.4 % — AB (ref 43.0–77.0)
Neutro Abs: 8.6 10*3/uL — ABNORMAL HIGH (ref 1.4–7.7)
Platelets: 198 10*3/uL (ref 150.0–400.0)
RBC: 2.95 Mil/uL — AB (ref 3.87–5.11)
RDW: 17.9 % — ABNORMAL HIGH (ref 11.5–15.5)
WBC: 10.5 10*3/uL (ref 4.0–10.5)

## 2015-12-24 LAB — TYPE AND SCREEN
ABO/RH(D): A POS
ABO/RH(D): A POS
ANTIBODY SCREEN: NEGATIVE
Antibody Screen: NEGATIVE
UNIT DIVISION: 0
UNIT DIVISION: 0
Unit division: 0
Unit division: 0

## 2015-12-24 LAB — URINALYSIS, ROUTINE W REFLEX MICROSCOPIC
Bilirubin Urine: NEGATIVE
GLUCOSE, UA: NEGATIVE mg/dL
Hgb urine dipstick: NEGATIVE
Ketones, ur: NEGATIVE mg/dL
LEUKOCYTES UA: NEGATIVE
Nitrite: NEGATIVE
PH: 5.5 (ref 5.0–8.0)
PROTEIN: NEGATIVE mg/dL
SPECIFIC GRAVITY, URINE: 1.019 (ref 1.005–1.030)

## 2015-12-24 LAB — IBC PANEL
IRON: 40 ug/dL — AB (ref 42–145)
Saturation Ratios: 16.4 % — ABNORMAL LOW (ref 20.0–50.0)
TRANSFERRIN: 174 mg/dL — AB (ref 212.0–360.0)

## 2015-12-24 LAB — FERRITIN: FERRITIN: 48 ng/mL (ref 10.0–291.0)

## 2015-12-24 LAB — LIPASE, BLOOD: Lipase: 38 U/L (ref 11–51)

## 2015-12-24 LAB — POC OCCULT BLOOD, ED: FECAL OCCULT BLD: POSITIVE — AB

## 2015-12-24 NOTE — ED Notes (Signed)
[facility]ubowitz aware of pt status and complaint; per Jacub[facility] order abdominal pain protocol minus CBC already drawn today.

## 2015-12-24 NOTE — Telephone Encounter (Signed)
Patient is in agreement to come for labs.

## 2015-12-24 NOTE — Telephone Encounter (Signed)
Patient was discharged to home 12/23/15 after an acute GI bleed. She received a transfusion while in the hospital. She is having formed dark stool again. She states she feels terrible. She thinks she sees blood leaching out of the stool once it is in the toilet. She is on Doxycycline. She takes a natural "blood builder" because she doesn't tolerate PO iron. She says she tries to "do one little thing" and she has to sit down because she is too tired and feels like she did before the transfusion.

## 2015-12-24 NOTE — Discharge Instructions (Signed)
The [facility] gastroenterology office will call you tomorrow  For follow-up. If you don't hear from the office by noon, call and ask to speak with a physician.They may want to see you in the office.tell office staff at [place] spoke with Dr.Dagny Fiorentino about your case

## 2015-12-24 NOTE — Telephone Encounter (Signed)
She called as I was on call - said she was continuing to pass dark black stools just like prior to recent admission.  Is tired and very weak. On way to ED.  Chart reviewed after this call - Hgb was stable - so ? Recurrent bleeding without Hgb decrease yet vs old blood as suspected by Dr. Silverio Decamp.  ED to sort out.

## 2015-12-24 NOTE — Telephone Encounter (Signed)
Pt called to let you know that she went into the hospital and was released yesterday. She woke up this morning and she is bleeding again. Can you please give her a call at [phone]

## 2015-12-24 NOTE — Telephone Encounter (Signed)
She may be passing some old blood,  have patient check CBC and ferritin and iron studies. She will need iron infusion, please schedule her. But if she continues to have dark stool with blood for her next few days, we will need to evaluate her for ongoing bleed in the hospital

## 2015-12-24 NOTE — Telephone Encounter (Signed)
No answer.

## 2015-12-24 NOTE — Telephone Encounter (Signed)
Called pt to get her set-up for TCM appt no answer LMOM RTC...Paula Ward

## 2015-12-24 NOTE — ED Notes (Signed)
Discharge instructions and follow up care reviewed with patient. Patient verbalized understanding.

## 2015-12-24 NOTE — ED Notes (Signed)
MD at bedside. 

## 2015-12-24 NOTE — ED Notes (Signed)
Bed: KQ20 Expected date:  Expected time:  Means of arrival:  Comments: EMS

## 2015-12-24 NOTE — ED Provider Notes (Signed)
CSN: [id number]     Arrival date & time 12/24/15  1753 History   First MD Initiated Contact with Patient 12/24/15 2159     Chief Complaint  Patient presents with  . Abdominal Pain  . Rectal Bleeding     (Consider location/radiation/quality/duration/timing/severity/associated sxs/prior Treatment) HPI Complains of epigastric abdominal pain, nonradiating ssince leaving the hospital yesterday. She was admitted for GI bleed she had EGD on 03/04/2017sshowing unspecified gastritis. No other associated symptoms. No treatment prior to comingepigastric pain is mild to moderate at present.And nonradiating. Past Medical History  Diagnosis Date  . Anemia   . Pulmonary embolus ([place]) 2003  . CREST syndrome ([place])   . GERD (gastroesophageal reflux disease)     w/ HH  . Angiodysplasia of stomach and duodenum   . Arthus phenomenon   . Pulmonary hypertension ([place])     followed by Dr Gaynell Face at [facility]  . Interstitial lung disease ([place])   . Scleroderma ([place])   . Rectal incontinence   . AVM (arteriovenous malformation)   . Nodule of kidney   . Uterine polyp   . Chronic kidney disease     Chronic mild renal insuffiency  . Breast cancer ([place]) 1989    Left  . Renal cell carcinoma ([place])   . Candida esophagitis ([place])   . Cataract   . Tubular adenoma of colon   . Blood transfusion without reported diagnosis   . PONV (postoperative nausea and vomiting)    Past Surgical History  Procedure Laterality Date  . Tonsillectomy    . Appendectomy  1962  . Breast lumpectomy  1989    left  . Ivc filter    . Kidney surgery      left   . Cataract extraction, bilateral Bilateral 12/2013  . Esophagogastroduodenoscopy (egd) with propofol N/A 12/21/2015    Procedure: ESOPHAGOGASTRODUODENOSCOPY (EGD) WITH PROPOFOL;  Surgeon: Irene Shipper, MD;  Location: [facility];  Service: Endoscopy;  Laterality: N/A;   Family History  Problem Relation Age of Onset  . Bladder Cancer Father   . Diabetes Father   . Prostate  cancer Father   . Alzheimer's disease Mother   . Colon cancer Neg Hx   . Lung cancer Sister   . Diabetes Sister   . Lung cancer Sister      smoker  . Esophageal cancer Paternal Uncle    Social History  Substance Use Topics  . Smoking status: Never Smoker   . Smokeless tobacco: Never Used  . Alcohol Use: No   OB History    No data available     Review of Systems  Constitutional: Negative.   HENT: Negative.   Respiratory: Positive for cough.        Cough for several days which is improving after treatment with antibiotics  Cardiovascular: Negative.   Gastrointestinal: Positive for abdominal pain and blood in stool.  Musculoskeletal: Negative.   Skin: Negative.   Neurological: Negative.   Psychiatric/Behavioral: Negative.   All other systems reviewed and are negative.     Allergies  Codeine; Lisinopril; and Other  Home Medications   Prior to Admission medications   Medication Sig Start Date End Date Taking? Authorizing Provider  acetaminophen (TYLENOL) 325 MG tablet Take 650 mg by mouth every 6 (six) hours as needed for mild pain.     Historical Provider, MD  albuterol (PROVENTIL HFA;VENTOLIN HFA) 108 (90 BASE) MCG/ACT inhaler Inhale 2 puffs into the lungs every 6 (six) hours as needed for wheezing or  shortness of breath. 08/31/15   Hoyt Koch, MD  ALPRAZolam Duanne Moron) 0.25 MG tablet Take 0.5 tablets (0.125 mg total) by mouth at bedtime as needed. Patient taking differently: Take 0.125 mg by mouth at bedtime as needed for sleep.  02/23/15   Hoyt Koch, MD  chlorhexidine (PERIDEX) 0.12 % solution swish mouth with 1/2 ounce for 60 seconds and spit out twice daily. DO NOT EA...  (REFER TO PRESCRIPTION NOTES). 07/30/15   Historical Provider, MD  conjugated estrogens (PREMARIN) vaginal cream Place vaginally daily. 02/23/15   Hoyt Koch, MD  Cyanocobalamin (VITAMIN B-12 PO) Take 1 tablet by mouth daily.     Historical Provider, MD  diltiazem (CARDIZEM  CD) 120 MG 24 hr capsule Take 120 mg by mouth daily.      Historical Provider, MD  doxycycline (VIBRA-TABS) 100 MG tablet Take 1 tablet (100 mg total) by mouth every 12 (twelve) hours. 12/23/15   Simbiso Ranga, MD  fluticasone (FLONASE) 50 MCG/ACT nasal spray instill 1 spray into each nostril twice a day if needed 08/31/15   Hoyt Koch, MD  furosemide (LASIX) 20 MG tablet Take 20 mg by mouth daily as needed for fluid.  06/28/15 06/27/16  Historical Provider, MD  guaiFENesin (MUCINEX) 600 MG 12 hr tablet Take 2 tablets (1,200 mg total) by mouth 2 (two) times daily. 12/23/15   Simbiso Ranga, MD  IRON PO Take 1 tablet by mouth daily. Pt uses Vitamin Code brand    Historical Provider, MD  loratadine (CLARITIN) 10 MG tablet Take 10 mg by mouth daily.    Historical Provider, MD  losartan (COZAAR) 25 MG tablet Take 25 mg by mouth daily.    Historical Provider, MD  omeprazole (PRILOSEC) 40 MG capsule Take 1 capsule (40 mg total) by mouth 2 (two) times daily. Patient taking differently: Take 40 mg by mouth daily.  06/14/15   Lafayette Dragon, MD  OPSUMIT 10 MG TABS Take 1 tablet by mouth daily.  08/16/15   Historical Provider, MD  Pyridoxine HCl (VITAMIN B6 PO) Take 1 tablet by mouth daily.     Historical Provider, MD   BP 131/62 mmHg  Pulse 92  Temp(Src) 98 F (36.7 C)  Resp 20  SpO2 99% Physical Exam  Constitutional: She appears well-developed and well-nourished.  HENT:  Head: Normocephalic and atraumatic.  Eyes: Conjunctivae are normal. Pupils are equal, round, and reactive to light.  Neck: Neck supple. No tracheal deviation present. No thyromegaly present.  Cardiovascular: Normal rate and regular rhythm.   No murmur heard. Pulmonary/Chest: Effort normal and breath sounds normal.  Abdominal: Soft. Bowel sounds are normal. She exhibits no distension. There is no tenderness.  Genitourinary: Guaiac positive stool.  Black stool  Musculoskeletal: Normal range of motion. She exhibits no edema or  tenderness.  Neurological: She is alert. Coordination normal.  Skin: Skin is warm and dry. No rash noted.  Psychiatric: She has a normal mood and affect.  Nursing note and vitals reviewed.   ED Course  Procedures (including critical care time) Labs Review Labs Reviewed  COMPREHENSIVE METABOLIC PANEL - Abnormal; Notable for the following:    Glucose, Bld 120 (*)    BUN 55 (*)    Creatinine, Ser 1.62 (*)    Calcium 8.8 (*)    Total Protein 6.3 (*)    GFR calc non Af Amer 30 (*)    GFR calc Af Amer 34 (*)    All other components within normal limits  POC OCCULT BLOOD, ED - Abnormal; Notable for the following:    Fecal Occult Bld POSITIVE (*)    All other components within normal limits  LIPASE, BLOOD  URINALYSIS, ROUTINE W REFLEX MICROSCOPIC (NOT AT [facility])  POC OCCULT BLOOD, ED  TYPE AND SCREEN    Imaging Review No results found. I have personally reviewed and evaluated these images and lab results as part of my medical decision-making.   EKG Interpretation None     Results for orders placed or performed during the hospital encounter of 12/24/15  Lipase, blood  Result Value Ref Range   Lipase 38 11 - 51 U/L  Comprehensive metabolic panel  Result Value Ref Range   Sodium 140 135 - 145 mmol/L   Potassium 4.3 3.5 - 5.1 mmol/L   Chloride 110 101 - 111 mmol/L   CO2 22 22 - 32 mmol/L   Glucose, Bld 120 (H) 65 - 99 mg/dL   BUN 55 (H) 6 - 20 mg/dL   Creatinine, Ser 1.62 (H) 0.44 - 1.00 mg/dL   Calcium 8.8 (L) 8.9 - 10.3 mg/dL   Total Protein 6.3 (L) 6.5 - 8.1 g/dL   Albumin 3.8 3.5 - 5.0 g/dL   AST 24 15 - 41 U/L   ALT 16 14 - 54 U/L   Alkaline Phosphatase 40 38 - 126 U/L   Total Bilirubin 0.5 0.3 - 1.2 mg/dL   GFR calc non Af Amer 30 (L) >60 mL/min   GFR calc Af Amer 34 (L) >60 mL/min   Anion gap 8 5 - 15  Urinalysis, Routine w reflex microscopic (not at [facility])  Result Value Ref Range   Color, Urine YELLOW YELLOW   APPearance CLEAR CLEAR   Specific Gravity, Urine  1.019 1.005 - 1.030   pH 5.5 5.0 - 8.0   Glucose, UA NEGATIVE NEGATIVE mg/dL   Hgb urine dipstick NEGATIVE NEGATIVE   Bilirubin Urine NEGATIVE NEGATIVE   Ketones, ur NEGATIVE NEGATIVE mg/dL   Protein, ur NEGATIVE NEGATIVE mg/dL   Nitrite NEGATIVE NEGATIVE   Leukocytes, UA NEGATIVE NEGATIVE  POC occult blood, ED  Result Value Ref Range   Fecal Occult Bld POSITIVE (A) NEGATIVE  Type and screen [place]  Result Value Ref Range   ABO/RH(D) A POS    Antibody Screen NEG    Sample Expiration 12/27/2015    Dg Chest 2 View  12/20/2015  CLINICAL DATA:  Epigastric pain with blood in stools x 4 days with lightheadedness. Pt has hx of PE, GI Bleed, HTN, Breast lumpectomy, Nonsmoker. EXAM: CHEST  2 VIEW COMPARISON:  12/14/2014 FINDINGS: Marked enlargement of the cardiopericardial silhouette, stable. No mediastinal or hilar masses or evidence of adenopathy. Prominent bronchovascular and interstitial markings are stable from the prior study. No evidence of pneumonia or pulmonary edema. No pleural effusion or pneumothorax. Stable changes from left breast surgery. Bony thorax is demineralized but intact. IMPRESSION: No acute cardiopulmonary disease.  Marked cardiomegaly. Electronically Signed   By: Lajean Manes M.D.   On: 12/20/2015 20:34    MDM  In light of hemoglobin improving over last week  Blood per rectum is likely old blood prior to the procedure.hemoglobin today was 9.0.3 days ago hemoglobin was 7.4 I spoke with Dr. Carlean Purl. Plan office to call patient tomorrow for follow-up  Patient is in agreement. Final diagnoses:  None   Diagnosis #1 rectal bleeding #2 renal insufficiency     Orlie Dakin, MD 12/24/15 2319

## 2015-12-24 NOTE — ED Notes (Signed)
Bed: RP39 Expected date:  Expected time:  Means of arrival:  Comments: Fever, catheter issues

## 2015-12-24 NOTE — Telephone Encounter (Signed)
I spoke with patient and she is bleeding from the rectum. She has a call in to for Dr. Henrene Pastor to call her back, which is the GI doctor from the hospital. Per Dr. Sharlet Salina, wait until 5 to see if she hears from Dr. Henrene Pastor and if she does not, go ahead and go back to the hospital. Patient aware.

## 2015-12-24 NOTE — ED Notes (Signed)
Pt reports discharged yesterday with diagnosis of rectal bleeding; during admission given 4 units of blood and had endoscopic procedure. Pt states no BM until today and reports dark tarry stools like ones that caused here to come to hospital prior to admission. Had blood count checked today with level 9 which was increase since discharge/8.7.

## 2015-12-25 ENCOUNTER — Ambulatory Visit (INDEPENDENT_AMBULATORY_CARE_PROVIDER_SITE_OTHER): Payer: Medicare Other | Admitting: Gastroenterology

## 2015-12-25 ENCOUNTER — Other Ambulatory Visit: Payer: Medicare Other

## 2015-12-25 ENCOUNTER — Encounter: Payer: Self-pay | Admitting: Gastroenterology

## 2015-12-25 VITALS — BP 100/58 | HR 84 | Ht 65.0 in | Wt 126.2 lb

## 2015-12-25 DIAGNOSIS — Q2733 Arteriovenous malformation of digestive system vessel: Secondary | ICD-10-CM

## 2015-12-25 DIAGNOSIS — K921 Melena: Secondary | ICD-10-CM | POA: Diagnosis not present

## 2015-12-25 DIAGNOSIS — K5520 Angiodysplasia of colon without hemorrhage: Secondary | ICD-10-CM

## 2015-12-25 DIAGNOSIS — D649 Anemia, unspecified: Secondary | ICD-10-CM

## 2015-12-25 LAB — CBC WITH DIFFERENTIAL/PLATELET
BASOS ABS: 0 10*3/uL (ref 0.0–0.1)
BASOS PCT: 0 % (ref 0–1)
EOS ABS: 0.1 10*3/uL (ref 0.0–0.7)
Eosinophils Relative: 1 % (ref 0–5)
HCT: 26.3 % — ABNORMAL LOW (ref 36.0–46.0)
HEMOGLOBIN: 8.7 g/dL — AB (ref 12.0–15.0)
Lymphocytes Relative: 16 % (ref 12–46)
Lymphs Abs: 1.3 10*3/uL (ref 0.7–4.0)
MCH: 30.7 pg (ref 26.0–34.0)
MCHC: 33.1 g/dL (ref 30.0–36.0)
MCV: 92.9 fL (ref 78.0–100.0)
MONOS PCT: 4 % (ref 3–12)
MPV: 9.8 fL (ref 8.6–12.4)
Monocytes Absolute: 0.3 10*3/uL (ref 0.1–1.0)
NEUTROS ABS: 6.6 10*3/uL (ref 1.7–7.7)
NEUTROS PCT: 79 % — AB (ref 43–77)
PLATELETS: 217 10*3/uL (ref 150–400)
RBC: 2.83 MIL/uL — AB (ref 3.87–5.11)
RDW: 17.3 % — ABNORMAL HIGH (ref 11.5–15.5)
WBC: 8.4 10*3/uL (ref 4.0–10.5)

## 2015-12-25 NOTE — Telephone Encounter (Signed)
Called pt back completed TCM call below.../lmb  Transition Care Management Follow-up Telephone Call   Date discharged? 12/23/15   How have you been since you were released from the hospital? Pt states she ok, but still having blood in her stool. Had to go back to ER on yesterday. This am had a bowel movement w/blood  Also in stool   Do you understand why you were in the hospital? YES   Do you understand the discharge instructions? YES   Where were you discharged to? Home   Items Reviewed:  Medications reviewed: YES  Allergies reviewed: YES  Dietary changes reviewed: YES  Referrals reviewed: YES, she states she is still waiting on GI to call w/appt   Functional Questionnaire:  Activities of Daily Living (ADLs):   She states sheyare independent in the following: ambulation, bathing and hygiene, feeding, continence, grooming, toileting and dressing States she doesn't require assistance    Any transportation issues/concerns?: NO   Any patient concerns? {YES, she states she just concern about the blood loss. Advise pt to continue taking iron pills that was rx, and Er recommendations   Confirmed importance and date/time of follow-up visits scheduled YES, 01/08/16 tried to bring her in sooner to see another MD, but pt decline she states she rather see Dr. Sharlet Salina  Provider Appointment booked with Dr. Sharlet Salina  Confirmed with patient if condition begins to worsen call PCP or go to the ER.  Patient was given the office number and encouraged to call back with question or concerns.  : YES

## 2015-12-25 NOTE — Progress Notes (Signed)
Paula Ward    [id number]    1936-02-02  Primary Care Physician:Elizabeth Becky Augusta, MD  Referring Physician: Hoyt Koch, MD [place], [place] [id number]  Chief complaint:  Melena  HPI: 80 year old female with history of crest syndrome and pulmonary hypertension here for follow-up visit after a recent admission with possible upper GI bleed secondary to AVM status post APC. Patient continues to have melena, and is feeling tired. She went to the ER last night, was noted to have stable hemoglobin and was discharged home. Denies any nausea or vomiting or bright red blood per rectum. She was getting iron infusions in the past, last infusion in December 2015.   Outpatient Encounter Prescriptions as of 12/25/2015  Medication Sig  . acetaminophen (TYLENOL) 325 MG tablet Take 650 mg by mouth every 6 (six) hours as needed for mild pain.   Marland Kitchen albuterol (PROVENTIL HFA;VENTOLIN HFA) 108 (90 BASE) MCG/ACT inhaler Inhale 2 puffs into the lungs every 6 (six) hours as needed for wheezing or shortness of breath.  . ALPRAZolam (XANAX) 0.25 MG tablet Take 0.5 tablets (0.125 mg total) by mouth at bedtime as needed. (Patient taking differently: Take 0.125 mg by mouth at bedtime as needed for sleep. )  . chlorhexidine (PERIDEX) 0.12 % solution swish mouth with 1/2 ounce for 60 seconds and spit out twice daily. DO NOT EA...  (REFER TO PRESCRIPTION NOTES).  . conjugated estrogens (PREMARIN) vaginal cream Place vaginally daily.  . Cyanocobalamin (VITAMIN B-12 PO) Take 1 tablet by mouth daily.   Marland Kitchen dexamethasone 0.5 MG/5ML elixir   . diltiazem (CARDIZEM CD) 120 MG 24 hr capsule Take 120 mg by mouth daily.    Marland Kitchen doxycycline (VIBRA-TABS) 100 MG tablet Take 1 tablet (100 mg total) by mouth every 12 (twelve) hours.  . fluticasone (FLONASE) 50 MCG/ACT nasal spray instill 1 spray into each nostril twice a day if needed  . furosemide (LASIX) 20 MG tablet Take 20 mg by mouth daily as  needed for fluid.   Marland Kitchen guaiFENesin (MUCINEX) 600 MG 12 hr tablet Take 2 tablets (1,200 mg total) by mouth 2 (two) times daily.  . IRON PO Take 1 tablet by mouth daily. Pt uses Vitamin Code brand  . loratadine (CLARITIN) 10 MG tablet Take 10 mg by mouth daily.  Marland Kitchen losartan (COZAAR) 25 MG tablet Take 25 mg by mouth daily.  Marland Kitchen omeprazole (PRILOSEC) 40 MG capsule Take 1 capsule (40 mg total) by mouth 2 (two) times daily. (Patient taking differently: Take 40 mg by mouth daily. )  . OPSUMIT 10 MG TABS Take 1 tablet by mouth daily.   . Pyridoxine HCl (VITAMIN B6 PO) Take 1 tablet by mouth daily.   Marland Kitchen triamcinolone cream (KENALOG) 0.1 %    No facility-administered encounter medications on file as of 12/25/2015.    Allergies as of 12/25/2015 - Review Complete 12/25/2015  Allergen Reaction Noted  . Codeine Nausea Only 08/03/2008  . Lisinopril Other (See Comments) 12/14/2014  . Other Nausea And Vomiting 04/05/2012    Past Medical History  Diagnosis Date  . Anemia   . Pulmonary embolus ([place]) 2003  . CREST syndrome ([place])   . GERD (gastroesophageal reflux disease)     w/ HH  . Angiodysplasia of stomach and duodenum   . Arthus phenomenon   . Pulmonary hypertension ([place])     followed by Dr Gaynell Face at [facility]  . Interstitial lung disease ([place])   .  Scleroderma ([place])   . Rectal incontinence   . AVM (arteriovenous malformation)   . Nodule of kidney   . Uterine polyp   . Chronic kidney disease     Chronic mild renal insuffiency  . Breast cancer ([place]) 1989    Left  . Renal cell carcinoma ([place])   . Candida esophagitis ([place])   . Cataract   . Tubular adenoma of colon   . Blood transfusion without reported diagnosis   . PONV (postoperative nausea and vomiting)     Past Surgical History  Procedure Laterality Date  . Tonsillectomy    . Appendectomy  1962  . Breast lumpectomy  1989    left  . Ivc filter    . Kidney surgery      left   . Cataract extraction, bilateral Bilateral 12/2013  .  Esophagogastroduodenoscopy (egd) with propofol N/A 12/21/2015    Procedure: ESOPHAGOGASTRODUODENOSCOPY (EGD) WITH PROPOFOL;  Surgeon: Irene Shipper, MD;  Location: [facility];  Service: Endoscopy;  Laterality: N/A;    Family History  Problem Relation Age of Onset  . Bladder Cancer Father   . Diabetes Father   . Prostate cancer Father   . Alzheimer's disease Mother   . Colon cancer Neg Hx   . Lung cancer Sister   . Diabetes Sister   . Lung cancer Sister      smoker  . Esophageal cancer Paternal Uncle     Social History   Social History  . Marital Status: Married    Spouse Name: N/A  . Number of Children: N/A  . Years of Education: N/A   Occupational History  . Retired    Social History Main Topics  . Smoking status: Never Smoker   . Smokeless tobacco: Never Used  . Alcohol Use: No  . Drug Use: No  . Sexual Activity: No   Other Topics Concern  . Not on file   Social History Narrative   Married '61   1 son- '65, 1 daughter '63; 6 children (2 adopted)   SO- SOB   Retirement- doing well   Marriage in good health   Patient has never smoked   Alcohol use- no   Pt gets regular exercise            Review of systems: Review of Systems  Constitutional: Negative for fever and chills.  HENT: Negative.   Eyes: Negative for blurred vision.  Respiratory: Negative for cough, shortness of breath and wheezing.   Cardiovascular: Negative for chest pain and palpitations.  Gastrointestinal: as per HPI Genitourinary: Negative for dysuria, urgency, frequency and hematuria.  Musculoskeletal: Negative for myalgias, back pain and joint pain.  Skin: Negative for itching and rash.  Neurological: Negative for dizziness, tremors, focal weakness, seizures and loss of consciousness.  Endo/Heme/Allergies: Negative for environmental allergies.  Psychiatric/Behavioral: Negative for depression, suicidal ideas and hallucinations.  All other systems reviewed and are  negative.   Physical Exam: Filed Vitals:   12/25/15 1450  BP: 100/58  Pulse: 84   Gen:      No acute distress HEENT:  EOMI, sclera anicteric Neck:     No masses; no thyromegaly Lungs:    Clear to auscultation bilaterally; normal respiratory effort CV:         Regular rate and rhythm; no murmurs Abd:      + bowel sounds; soft, non-tender; no palpable masses, no distension Ext:    No edema; adequate peripheral perfusion Skin:  Warm and dry; no rash Neuro: alert and oriented x 3 Psych: normal mood and affect  Data Reviewed: Colonoscopy 06/2014 1. Sessile polyp was found in the ascending colon; polypectomy was performed with cold forceps 2. There was moderate diverticulosis noted throughout the entire examined colon ,more severe in the left colon 3. no evidence of AVMs 4. nothing to account for right lower quadrant abdominal pain. Suspect adhesions  EGD 12/21/2015 1. Small gastric and duodenal AVMs ablated with APC.No blood or active bleeding in upper gut to jejunum 2. Incidental proximal gastric diverticulum. Otherwise unremarkable exam   Assessment and Plan/Recommendations:  80 year old female with history of crest syndrome, pulmonary hypertension and gastrointestinal AVMs here for follow-up visit after recent admission with possible upper GI bleed secondary to upper gastrointestinal tract bleeding AVM She continues to have melena, could be passing old blood or having ongoing recurrent GI bleed We'll recheck CBC If she continues to have melena or dark stool will consider small bowel video capsule to evaluate for possible small bowel bleeding AVM and may need enteroscopy based on the results We will also schedule patient for iron infusions Return in 1 month or earlier if needed  K. Denzil Magnuson , MD [phone] Mon-Fri 8a-5p [phone] after 5p, weekends, holidays

## 2015-12-25 NOTE — Patient Instructions (Signed)
Go to the basement for STAT labs today Paula Ward will contact you about when your Iron Infusion will be Your follow up with Dr Silverio Decamp is on 01/30/2016 at 4pm

## 2015-12-25 NOTE — Telephone Encounter (Signed)
Patient seen in the ER and sent home. She will come in to be seen today.

## 2015-12-26 ENCOUNTER — Telehealth: Payer: Self-pay

## 2015-12-26 ENCOUNTER — Other Ambulatory Visit: Payer: Self-pay

## 2015-12-26 DIAGNOSIS — D62 Acute posthemorrhagic anemia: Secondary | ICD-10-CM

## 2015-12-26 DIAGNOSIS — N183 Chronic kidney disease, stage 3 unspecified: Secondary | ICD-10-CM

## 2015-12-26 DIAGNOSIS — D509 Iron deficiency anemia, unspecified: Secondary | ICD-10-CM

## 2015-12-26 LAB — IRON,TIBC AND FERRITIN PANEL
%SAT: 16 % (ref 11–50)
FERRITIN: 54 ng/mL (ref 20–288)
IRON: 33 ug/dL — AB (ref 45–160)
TIBC: 211 ug/dL — AB (ref 250–450)

## 2015-12-26 NOTE — Telephone Encounter (Signed)
Patient notified of the following: Iron infusion at [facility] on 01/07/16 and again on 01/14/16 both at 11:00 am.

## 2015-12-27 ENCOUNTER — Encounter: Payer: Self-pay | Admitting: Gastroenterology

## 2015-12-28 ENCOUNTER — Telehealth: Payer: Self-pay | Admitting: Gastroenterology

## 2015-12-28 ENCOUNTER — Other Ambulatory Visit: Payer: Self-pay

## 2015-12-28 DIAGNOSIS — D649 Anemia, unspecified: Secondary | ICD-10-CM

## 2015-12-28 DIAGNOSIS — K2971 Gastritis, unspecified, with bleeding: Secondary | ICD-10-CM

## 2015-12-28 DIAGNOSIS — K5520 Angiodysplasia of colon without hemorrhage: Secondary | ICD-10-CM

## 2015-12-28 NOTE — Telephone Encounter (Signed)
I have left message for the patient to call back. See lab results.

## 2015-12-31 ENCOUNTER — Ambulatory Visit (INDEPENDENT_AMBULATORY_CARE_PROVIDER_SITE_OTHER): Payer: Medicare Other | Admitting: Gastroenterology

## 2015-12-31 DIAGNOSIS — K921 Melena: Secondary | ICD-10-CM | POA: Diagnosis not present

## 2015-12-31 DIAGNOSIS — Q2730 Arteriovenous malformation, site unspecified: Secondary | ICD-10-CM | POA: Diagnosis not present

## 2015-12-31 NOTE — Progress Notes (Signed)
Patient here for capsule endoscopy.  She verbalized understanding of all written and verbal instructions.   Lot # [id number] capsule ID [id number] exp 11/29/2016

## 2015-12-31 NOTE — Telephone Encounter (Signed)
Scheduled for an endo capsule study.

## 2016-01-07 ENCOUNTER — Encounter (HOSPITAL_COMMUNITY): Payer: Self-pay

## 2016-01-07 ENCOUNTER — Encounter (HOSPITAL_COMMUNITY)
Admission: RE | Admit: 2016-01-07 | Discharge: 2016-01-07 | Disposition: A | Discharge status: Home / Self Care | Payer: Medicare Other | Source: Ambulatory Visit | Attending: Gastroenterology | Admitting: Gastroenterology

## 2016-01-07 VITALS — BP 118/42 | HR 76 | Temp 97.6°F | Resp 16 | Ht 65.0 in | Wt 125.8 lb

## 2016-01-07 DIAGNOSIS — D62 Acute posthemorrhagic anemia: Secondary | ICD-10-CM | POA: Diagnosis not present

## 2016-01-07 MED ORDER — SODIUM CHLORIDE 0.9 % IV SOLN
INTRAVENOUS | Status: DC
  Administered 2016-01-07: 11:00:00 via INTRAVENOUS

## 2016-01-07 MED ORDER — SODIUM CHLORIDE 0.9 % IV SOLN
510.0000 mg | INTRAVENOUS | Status: DC
  Administered 2016-01-07: 510 mg via INTRAVENOUS
  Filled 2016-01-07: qty 17

## 2016-01-07 NOTE — Discharge Instructions (Signed)
Ferumoxytol injection What is this medicine? FERUMOXYTOL is an iron complex. Iron is used to make healthy red blood cells, which carry oxygen and nutrients throughout the body. This medicine is used to treat iron deficiency anemia in people with chronic kidney disease. This medicine may be used for other purposes; ask your health care provider or pharmacist if you have questions. What should I tell my health care provider before I take this medicine? They need to know if you have any of these conditions: -anemia not caused by low iron levels -high levels of iron in the blood -magnetic resonance imaging (MRI) test scheduled -an unusual or allergic reaction to iron, other medicines, foods, dyes, or preservatives -pregnant or trying to get pregnant -breast-feeding How should I use this medicine? This medicine is for injection into a vein. It is given by a health care professional in a hospital or clinic setting. Talk to your pediatrician regarding the use of this medicine in children. Special care may be needed. Overdosage: If you think you have taken too much of this medicine contact a poison control center or emergency room at once. NOTE: This medicine is only for you. Do not share this medicine with others. What if I miss a dose? It is important not to miss your dose. Call your doctor or health care professional if you are unable to keep an appointment. What may interact with this medicine? This medicine may interact with the following medications: -other iron products This list may not describe all possible interactions. Give your health care provider a list of all the medicines, herbs, non-prescription drugs, or dietary supplements you use. Also tell them if you smoke, drink alcohol, or use illegal drugs. Some items may interact with your medicine. What should I watch for while using this medicine? Visit your doctor or healthcare professional regularly. Tell your doctor or healthcare  professional if your symptoms do not start to get better or if they get worse. You may need blood work done while you are taking this medicine. You may need to follow a special diet. Talk to your doctor. Foods that contain iron include: whole grains/cereals, dried fruits, beans, or peas, leafy green vegetables, and organ meats (liver, kidney). What side effects may I notice from receiving this medicine? Side effects that you should report to your doctor or health care professional as soon as possible: -allergic reactions like skin rash, itching or hives, swelling of the face, lips, or tongue -breathing problems -changes in blood pressure -feeling faint or lightheaded, falls -fever or chills -flushing, sweating, or hot feelings -swelling of the ankles or feet Side effects that usually do not require medical attention (Report these to your doctor or health care professional if they continue or are bothersome.): -diarrhea -headache -nausea, vomiting -stomach pain This list may not describe all possible side effects. Call your doctor for medical advice about side effects. You may report side effects to FDA at 1-800-FDA-1088. Where should I keep my medicine? This drug is given in a hospital or clinic and will not be stored at home. NOTE: This sheet is a summary. It may not cover all possible information. If you have questions about this medicine, talk to your doctor, pharmacist, or health care provider.    2016, Elsevier/Gold Standard. (2012-05-21 15:23:36)  Anemia, Nonspecific Anemia is a condition in which the concentration of red blood cells or hemoglobin in the blood is below normal. Hemoglobin is a substance in red blood cells that carries oxygen to the tissues  of the body. Anemia results in not enough oxygen reaching these tissues.  CAUSES  Common causes of anemia include:   Excessive bleeding. Bleeding may be internal or external. This includes excessive bleeding from periods (in  women) or from the intestine.   Poor nutrition.   Chronic kidney, thyroid, and liver disease.  Bone marrow disorders that decrease red blood cell production.  Cancer and treatments for cancer.  HIV, AIDS, and their treatments.  Spleen problems that increase red blood cell destruction.  Blood disorders.  Excess destruction of red blood cells due to infection, medicines, and autoimmune disorders. SIGNS AND SYMPTOMS   Minor weakness.   Dizziness.   Headache.  Palpitations.   Shortness of breath, especially with exercise.   Paleness.  Cold sensitivity.  Indigestion.  Nausea.  Difficulty sleeping.  Difficulty concentrating. Symptoms may occur suddenly or they may develop slowly.  DIAGNOSIS  Additional blood tests are often needed. These help your health care provider determine the best treatment. Your health care provider will check your stool for blood and look for other causes of blood loss.  TREATMENT  Treatment varies depending on the cause of the anemia. Treatment can include:   Supplements of iron, vitamin T21, or folic acid.   Hormone medicines.   A blood transfusion. This may be needed if blood loss is severe.   Hospitalization. This may be needed if there is significant continual blood loss.   Dietary changes.  Spleen removal. HOME CARE INSTRUCTIONS Keep all follow-up appointments. It often takes many weeks to correct anemia, and having your health care provider check on your condition and your response to treatment is very important. SEEK IMMEDIATE MEDICAL CARE IF:   You develop extreme weakness, shortness of breath, or chest pain.   You become dizzy or have trouble concentrating.  You develop heavy vaginal bleeding.   You develop a rash.   You have bloody or black, tarry stools.   You faint.   You vomit up blood.   You vomit repeatedly.   You have abdominal pain.  You have a fever or persistent symptoms for more  than 2-3 days.   You have a fever and your symptoms suddenly get worse.   You are dehydrated.  MAKE SURE YOU:  Understand these instructions.  Will watch your condition.  Will get help right away if you are not doing well or get worse.   This information is not intended to replace advice given to you by your health care provider. Make sure you discuss any questions you have with your health care provider.   Document Released: 11/13/2004 Document Revised: 06/08/2013 Document Reviewed: 04/01/2013 Elsevier Interactive Patient Education [employer].

## 2016-01-08 ENCOUNTER — Ambulatory Visit (INDEPENDENT_AMBULATORY_CARE_PROVIDER_SITE_OTHER): Payer: Medicare Other | Admitting: Internal Medicine

## 2016-01-08 ENCOUNTER — Encounter: Payer: Self-pay | Admitting: Internal Medicine

## 2016-01-08 ENCOUNTER — Other Ambulatory Visit (INDEPENDENT_AMBULATORY_CARE_PROVIDER_SITE_OTHER): Payer: Medicare Other

## 2016-01-08 VITALS — BP 115/45 | HR 85 | Temp 98.4°F | Resp 18 | Ht 65.0 in | Wt 126.1 lb

## 2016-01-08 DIAGNOSIS — K922 Gastrointestinal hemorrhage, unspecified: Secondary | ICD-10-CM | POA: Diagnosis not present

## 2016-01-08 DIAGNOSIS — N183 Chronic kidney disease, stage 3 unspecified: Secondary | ICD-10-CM

## 2016-01-08 DIAGNOSIS — I272 Other secondary pulmonary hypertension: Secondary | ICD-10-CM

## 2016-01-08 DIAGNOSIS — D509 Iron deficiency anemia, unspecified: Secondary | ICD-10-CM

## 2016-01-08 LAB — COMPREHENSIVE METABOLIC PANEL
ALBUMIN: 3.9 g/dL (ref 3.5–5.2)
ALK PHOS: 40 U/L (ref 39–117)
ALT: 10 U/L (ref 0–35)
AST: 20 U/L (ref 0–37)
BILIRUBIN TOTAL: 0.4 mg/dL (ref 0.2–1.2)
BUN: 24 mg/dL — AB (ref 6–23)
CALCIUM: 9.1 mg/dL (ref 8.4–10.5)
CHLORIDE: 104 meq/L (ref 96–112)
CO2: 25 mEq/L (ref 19–32)
CREATININE: 1.24 mg/dL — AB (ref 0.40–1.20)
GFR: 44.54 mL/min — ABNORMAL LOW (ref >60.00)
Glucose, Bld: 109 mg/dL — ABNORMAL HIGH (ref 70–99)
Potassium: 4.5 mEq/L (ref 3.5–5.1)
SODIUM: 137 meq/L (ref 135–145)
TOTAL PROTEIN: 6.2 g/dL (ref 6.0–8.3)

## 2016-01-08 LAB — CBC
HCT: 25 % — ABNORMAL LOW (ref 36.0–46.0)
Hemoglobin: 8.2 g/dL — ABNORMAL LOW (ref 12.0–15.0)
MCHC: 33 g/dL (ref 30.0–36.0)
MCV: 96.2 fl (ref 78.0–100.0)
PLATELETS: 321 10*3/uL (ref 150.0–400.0)
RDW: 20.8 % — ABNORMAL HIGH (ref 11.5–15.5)
WBC: 6.2 10*3/uL (ref 4.0–10.5)

## 2016-01-08 NOTE — Patient Instructions (Addendum)
We will check the labs today and send them to you on mychart. We will also send the results to [facility] and to Dr. Silverio Decamp upstairs.   Increase the amount of salt in your diet to see if this can increase the blood pressure some.

## 2016-01-08 NOTE — Progress Notes (Signed)
Pre visit review using our clinic review tool, if applicable. No additional management support is needed unless otherwise documented below in the visit note.

## 2016-01-09 ENCOUNTER — Encounter: Payer: Self-pay | Admitting: Gastroenterology

## 2016-01-09 ENCOUNTER — Telehealth: Payer: Self-pay | Admitting: Gastroenterology

## 2016-01-09 ENCOUNTER — Encounter: Payer: Self-pay | Admitting: Internal Medicine

## 2016-01-09 NOTE — Assessment & Plan Note (Signed)
Creatinine elevated during recent hospitalization and needs to be rechecked today.

## 2016-01-09 NOTE — Assessment & Plan Note (Signed)
Oxygen levels stable on her home oxygen levels. Not volume overloaded. Checking CMP and CBC today for stability since some mild AKI during hospitalization.

## 2016-01-09 NOTE — Progress Notes (Signed)
Subjective:    Patient ID: Paula Ward, female    DOB: Nov 10, 1935, 80 y.o.   MRN: [id number]  HPI The patient is a 80 YO female coming in for hospital follow up (in for decreased hemoglobin and needed 4 pints of blood, suspected AVMs). 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 This was increased to BID in the hospital. She had been having black stools for some time prior to hospitalization but thought it would get better. She denies those since leaving the hospital. She got IV iron infusion recently which she was able to tolerate. She is still feeling very weak and not back to herself. She was immobilized in the hospital for 2-3 days and is not feeling strong. Denies falls but decreased mobility still.   PMH, [facility], social history reviewed and updated.   Review of Systems  Constitutional: Positive for fatigue. Negative for activity change, appetite change and unexpected weight change.  HENT: Negative.   Eyes: Negative.   Respiratory: Positive for shortness of breath. Negative for cough, chest tightness and wheezing.        Stable  Cardiovascular: Negative for chest pain, palpitations and leg swelling.  Gastrointestinal: Negative for abdominal pain, diarrhea, constipation, blood in stool, abdominal distention and anal bleeding.       No black stools  Endocrine: Negative.   Musculoskeletal: Positive for arthralgias. Negative for gait problem.  Skin: Negative.   Neurological: Positive for weakness. Negative for dizziness, light-headedness and headaches.  Hematological: Negative.   Psychiatric/Behavioral: Negative.      Objective:   Physical Exam  Constitutional: She is oriented to person, place, and time. She appears well-developed and well-nourished. No distress.  HENT:  Head: Normocephalic and atraumatic.  Eyes: EOM are normal.  Neck: Normal range of motion.  Cardiovascular: Normal rate and regular rhythm.   Murmur heard. Pulmonary/Chest: Effort normal and  breath sounds normal. No respiratory distress. She has no rales.  Abdominal: Soft. Bowel sounds are normal. She exhibits no distension. There is no tenderness. There is no rebound.  Neurological: She is alert and oriented to person, place, and time. Coordination normal.  Skin: Skin is warm and dry.   Filed Vitals:   01/08/16 1428  BP: 122/20  Pulse: 85  Temp: 98.4 F (36.9 C)  TempSrc: Oral  Resp: 18  Height: 5' 5" (1.651 m)  Weight: 126 lb 1.9 oz (57.208 kg)  SpO2: 94%      Assessment & Plan:

## 2016-01-09 NOTE — Assessment & Plan Note (Signed)
Recheck CBC today, she is still weak and likely somewhat debilitated from the hospitalization. No more black stools since the hospital. She is still waiting to hear the results of recent capsule endoscopy. Suspect from AVMs, recent MRI abdomen without any masses or changes.

## 2016-01-09 NOTE — Assessment & Plan Note (Signed)
Recent iron infusion and she is not feeling better with energy.

## 2016-01-10 ENCOUNTER — Other Ambulatory Visit: Payer: Self-pay

## 2016-01-10 DIAGNOSIS — D509 Iron deficiency anemia, unspecified: Secondary | ICD-10-CM

## 2016-01-10 DIAGNOSIS — Z189 Retained foreign body fragments, unspecified material: Secondary | ICD-10-CM

## 2016-01-10 NOTE — Telephone Encounter (Signed)
Has not seen the capsule and is not sure it passed. Procedure date was 12/31/15. Abdominal xray 1 view ordered.

## 2016-01-11 ENCOUNTER — Ambulatory Visit (INDEPENDENT_AMBULATORY_CARE_PROVIDER_SITE_OTHER)
Admission: RE | Admit: 2016-01-11 | Discharge: 2016-01-11 | Disposition: A | Discharge status: Home / Self Care | Payer: Medicare Other | Source: Ambulatory Visit | Attending: Gastroenterology | Admitting: Gastroenterology

## 2016-01-11 DIAGNOSIS — Z189 Retained foreign body fragments, unspecified material: Secondary | ICD-10-CM

## 2016-01-14 ENCOUNTER — Encounter: Payer: Self-pay | Admitting: Gastroenterology

## 2016-01-14 ENCOUNTER — Encounter (HOSPITAL_COMMUNITY)
Admission: RE | Admit: 2016-01-14 | Discharge: 2016-01-14 | Disposition: A | Discharge status: Home / Self Care | Payer: Medicare Other | Source: Ambulatory Visit | Attending: Gastroenterology | Admitting: Gastroenterology

## 2016-01-14 ENCOUNTER — Encounter (HOSPITAL_COMMUNITY): Payer: Self-pay

## 2016-01-14 VITALS — BP 117/48 | HR 100 | Temp 98.6°F | Resp 20 | Ht 64.0 in | Wt 125.2 lb

## 2016-01-14 DIAGNOSIS — D62 Acute posthemorrhagic anemia: Secondary | ICD-10-CM

## 2016-01-14 MED ORDER — SODIUM CHLORIDE 0.9 % IV SOLN
510.0000 mg | INTRAVENOUS | Status: AC
Start: 2016-01-14 — End: 2016-01-14
  Administered 2016-01-14: 510 mg via INTRAVENOUS
  Filled 2016-01-14: qty 17

## 2016-01-14 MED ORDER — SODIUM CHLORIDE 0.9 % IV SOLN
INTRAVENOUS | Status: AC
  Administered 2016-01-14: 12:00:00 via INTRAVENOUS

## 2016-01-14 NOTE — Progress Notes (Signed)
Uneventful infusion of #2/2 in this series of Feraheme 510 mg ( #1/2 was 01/07/16) Pt discharged ambulatory unaccompanied to elevator

## 2016-01-16 ENCOUNTER — Encounter (HOSPITAL_COMMUNITY): Payer: Self-pay | Admitting: *Deleted

## 2016-01-16 ENCOUNTER — Other Ambulatory Visit: Payer: Self-pay

## 2016-01-16 ENCOUNTER — Telehealth: Payer: Self-pay | Admitting: Gastroenterology

## 2016-01-16 ENCOUNTER — Other Ambulatory Visit (INDEPENDENT_AMBULATORY_CARE_PROVIDER_SITE_OTHER): Payer: Medicare Other

## 2016-01-16 DIAGNOSIS — D62 Acute posthemorrhagic anemia: Secondary | ICD-10-CM

## 2016-01-16 DIAGNOSIS — K921 Melena: Secondary | ICD-10-CM

## 2016-01-16 DIAGNOSIS — D509 Iron deficiency anemia, unspecified: Secondary | ICD-10-CM

## 2016-01-16 LAB — CBC WITH DIFFERENTIAL/PLATELET
BASOS ABS: 0 10*3/uL (ref 0.0–0.1)
Basophils Relative: 0.4 % (ref 0.0–3.0)
EOS PCT: 1 % (ref 0.0–5.0)
Eosinophils Absolute: 0.1 10*3/uL (ref 0.0–0.7)
HEMATOCRIT: 29.6 % — AB (ref 36.0–46.0)
HEMOGLOBIN: 9.8 g/dL — AB (ref 12.0–15.0)
LYMPHS PCT: 21.6 % (ref 12.0–46.0)
Lymphs Abs: 1.5 10*3/uL (ref 0.7–4.0)
MCHC: 33.2 g/dL (ref 30.0–36.0)
MCV: 98.1 fl (ref 78.0–100.0)
MONOS PCT: 5.8 % (ref 3.0–12.0)
Monocytes Absolute: 0.4 10*3/uL (ref 0.1–1.0)
NEUTROS PCT: 71.2 % (ref 43.0–77.0)
Neutro Abs: 5.1 10*3/uL (ref 1.4–7.7)
Platelets: 247 10*3/uL (ref 150.0–400.0)
RBC: 3.02 Mil/uL — AB (ref 3.87–5.11)
RDW: 21.4 % — ABNORMAL HIGH (ref 11.5–15.5)
WBC: 7.2 10*3/uL (ref 4.0–10.5)

## 2016-01-16 LAB — FERRITIN: Ferritin: 301.4 ng/mL — ABNORMAL HIGH (ref 10.0–291.0)

## 2016-01-16 NOTE — Progress Notes (Signed)
HPI: 80 year old female for evaluation of pulmonary hypertension. Previously seen in this office but not since 2011. 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 Has had IVC filter placed. Chest CT 2003 showed enlargement of right cardiac chambers suggesting markedly elevated right heart pressures, small to moderate pericardial effusion, findings suggestive of chronic pulmonary embolic disease and pleural effusions. Echocardiogram October 2003 technically difficult; mildly reduced LV function; moderate RAE/RVE, reduced RV function, moderate right ventricular hypertrophy and severely elevated pulmonary pressures; moderate tricuspid regurgitation and large pericardial effusion. Cardiac catheterization in 2003 showed normal coronary arteries, ejection fraction 55%, PA pressure of 79/35, wedge pressure 16, PVR 722. Patient has been followed at [facility] for her pulmonary hypertension. She has done remarkably well on medications. Last cardiac catheterization in September 2011 showed a PA pressure of 35/11 with PVR equal to 1.7 Wood units. In May 2016 she was noted to have a moderate sized pericardial effusion. She was seen again in February and it was described as large. Otherwise left ventricular function was normal. The effusion was not felt to be hemodynamically significant and it has been followed. 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 In March she had recurrent GI bleeding with hemoglobin decreased to 6. She was transfused with some improvement. She also has had endoscopy that has been interventions. Her most recent hemoglobin had improved to 9.8. During the time she was anemic she noticed increased dyspnea on exertion. That has improved recently. There is no orthopnea, PND, exertional chest pain or syncope. She occasionally has minimal pedal edema. She would like to have a physician in [place] but will continue  to be followed at [facility] for her pulmonary hypertension.  Current Outpatient Prescriptions  Medication Sig Dispense Refill  . acetaminophen (TYLENOL) 325 MG tablet Take 650 mg by mouth every 6 (six) hours as needed for mild pain.     Marland Kitchen albuterol (PROVENTIL HFA;VENTOLIN HFA) 108 (90 BASE) MCG/ACT inhaler Inhale 2 puffs into the lungs every 6 (six) hours as needed for wheezing or shortness of breath. 1 Inhaler 3  . ALPRAZolam (XANAX) 0.25 MG tablet Take 0.5 tablets (0.125 mg total) by mouth at bedtime as needed. (Patient taking differently: Take 0.125 mg by mouth at bedtime as needed for sleep. ) 30 tablet 3  . chlorhexidine (PERIDEX) 0.12 % solution swish mouth with 1/2 ounce for 60 seconds and spit out once daily. DO NOT EA...  (REFER TO PRESCRIPTION NOTES).  0  . Cyanocobalamin (VITAMIN B-12 PO) Take 1 tablet by mouth daily.     Marland Kitchen diltiazem (CARDIZEM CD) 120 MG 24 hr capsule Take 120 mg by mouth daily.      . fluticasone (FLONASE) 50 MCG/ACT nasal spray instill 1 spray into each nostril twice a day if needed 16 g 3  . furosemide (LASIX) 20 MG tablet Take 20 mg by mouth daily as needed for fluid.     . IRON PO Take 1 tablet by mouth daily. Pt uses Vitamin Code brand    . loratadine (CLARITIN) 10 MG tablet Take 10 mg by mouth daily as needed for allergies.     Marland Kitchen losartan (COZAAR) 25 MG tablet Take 25 mg by mouth daily as needed (Pt is instructed by doctor to take based on kidney lab work).     Marland Kitchen omeprazole (PRILOSEC) 40 MG capsule Take 1 capsule (40 mg total) by mouth 2 (two) times daily. (Patient taking  differently: Take 40 mg by mouth daily. ) 60 capsule 5  . OPSUMIT 10 MG TABS Take 1 tablet by mouth daily.     . Pyridoxine HCl (VITAMIN B6 PO) Take 1 tablet by mouth daily.     Marland Kitchen triamcinolone cream (KENALOG) 0.1 % Apply 1 application topically daily as needed (Irritated skin).      No current facility-administered medications for this visit.    Allergies  Allergen Reactions  . Codeine Nausea  Only    Hallucinations, too  . Lisinopril Other (See Comments)    Pt doesn't remember   . Other Nausea And Vomiting    "-mycin" antibiotics.   Also cause hallucinations.     Past Medical History  Diagnosis Date  . Anemia   . Pulmonary embolus ([place]) 2003  . CREST syndrome ([place])   . GERD (gastroesophageal reflux disease)     w/ HH  . Angiodysplasia of stomach and duodenum   . Arthus phenomenon   . Pulmonary hypertension ([place])     followed by Dr Gaynell Face at [facility], now Dr. Maryjean Ka visit end 2'17.  . Interstitial lung disease ([place])   . Scleroderma ([place])   . Rectal incontinence   . AVM (arteriovenous malformation)   . Nodule of kidney   . Uterine polyp   . Chronic kidney disease     Chronic mild renal insuffiency  . Breast cancer ([place]) 1989    Left  . Renal cell carcinoma ([place])   . Candida esophagitis ([place])   . Cataract   . Tubular adenoma of colon   . Blood transfusion without reported diagnosis     last 4 units 12-22-15, Iron infusion x2 last -01-07-16,01-14-16.  Marland Kitchen PONV (postoperative nausea and vomiting)     Past Surgical History  Procedure Laterality Date  . Tonsillectomy    . Appendectomy  1962  . Breast lumpectomy  1989    left  . Ivc filter    . Kidney surgery      left -"laser surgery by Dr. Kathlene Cote- 5 yrs ago" no removal  . Cataract extraction, bilateral Bilateral 12/2013  . Esophagogastroduodenoscopy (egd) with propofol N/A 12/21/2015    Procedure: ESOPHAGOGASTRODUODENOSCOPY (EGD) WITH PROPOFOL;  Surgeon: Irene Shipper, MD;  Location: [facility];  Service: Endoscopy;  Laterality: N/A;  . Tubal ligation    . Enteroscopy N/A 01/18/2016    Procedure: ENTEROSCOPY;  Surgeon: Mauri Pole, MD;  Location: [facility];  Service: Endoscopy;  Laterality: N/A;    Social History   Social History  . Marital Status: Married    Spouse Name: N/A  . Number of Children: 2  . Years of Education: N/A   Occupational History  . Retired    Social History Main  Topics  . Smoking status: Never Smoker   . Smokeless tobacco: Never Used  . Alcohol Use: No  . Drug Use: No  . Sexual Activity: No   Other Topics Concern  . Not on file   Social History Narrative   Married '61   1 son- '65, 1 daughter '63; 6 children (2 adopted)   SO- SOB   Retirement- doing well   Marriage in good health   Patient has never smoked   Alcohol use- no   Pt gets regular exercise          Family History  Problem Relation Age of Onset  . Bladder Cancer Father   . Diabetes Father   . Prostate cancer Father   .  Alzheimer's disease Mother   . Colon cancer Neg Hx   . Lung cancer Sister   . Diabetes Sister   . Lung cancer Sister      smoker  . Esophageal cancer Paternal Uncle     ROS: Recent melena but no fevers or chills, productive cough, hemoptysis, dysphasia, odynophagia, dysuria, hematuria, rash, seizure activity, orthopnea, PND, claudication. Remaining systems are negative.  Physical Exam:   Blood pressure 112/52, pulse 78, height _0  (1.626 m), weight 127 lb 4 oz (57.72 kg), SpO2 97 %.  General:  Well developed/well nourished in NAD Skin warm/dry Patient not depressed No peripheral clubbing Back-normal HEENT-normal/normal eyelids Neck supple/normal carotid upstroke bilaterally; no bruits; positive JVD; no thyromegaly chest - CTA/ normal expansion CV - RRR/normal S1 and S2; no rubs or gallops;  PMI nondisplaced; 2/6 systolic murmur LSB Abdomen -NT/ND, no HSM, no mass, + bowel sounds, no bruit 2+ femoral pulses, no bruits Ext-trace edema, no chords, 2+ DP Neuro-grossly nonfocal  ECG 12/20/2015-sinus rhythm, cannot rule out prior anterior infarct.

## 2016-01-16 NOTE — Telephone Encounter (Signed)
No answer. DPR on file. Message left for the patient to return the call. Answer. No voicemail. Silence on the phone. Will try again.

## 2016-01-16 NOTE — Telephone Encounter (Signed)
Patient aware of the plan of care. Paula Ward for enteroscopy 01/18/16 start time at 9:00 am. She will be NPO after midnight. She will get her CBC done today.

## 2016-01-16 NOTE — Telephone Encounter (Signed)
She has received her iron infusions. She calls today because she still doesn't feel well. She has no energy. She feels this is beyond the fatigue that would be normal for her age. There has been black stool. She is drinking Ensure. She would like to check her hemoglobin. Please advise.

## 2016-01-16 NOTE — Telephone Encounter (Signed)
Ok lets check Hgb. Please also schedule her for enteroscopy given she continues to have melena. She could possible be bleeding on and off. Please check if we can get her on schedule this Friday 3/31. Thanks

## 2016-01-18 ENCOUNTER — Encounter (HOSPITAL_COMMUNITY)
Admission: RE | Disposition: A | Discharge status: Home / Self Care | Payer: Self-pay | Source: Ambulatory Visit | Attending: Gastroenterology

## 2016-01-18 ENCOUNTER — Ambulatory Visit (HOSPITAL_COMMUNITY): Payer: Medicare Other | Admitting: Anesthesiology

## 2016-01-18 ENCOUNTER — Ambulatory Visit (HOSPITAL_COMMUNITY)
Admission: RE | Admit: 2016-01-18 | Discharge: 2016-01-18 | Disposition: A | Discharge status: Home / Self Care | Payer: Medicare Other | Source: Ambulatory Visit | Attending: Gastroenterology | Admitting: Gastroenterology

## 2016-01-18 ENCOUNTER — Encounter (HOSPITAL_COMMUNITY): Payer: Self-pay | Admitting: Anesthesiology

## 2016-01-18 DIAGNOSIS — D649 Anemia, unspecified: Secondary | ICD-10-CM | POA: Insufficient documentation

## 2016-01-18 DIAGNOSIS — K921 Melena: Secondary | ICD-10-CM | POA: Diagnosis not present

## 2016-01-18 DIAGNOSIS — K5520 Angiodysplasia of colon without hemorrhage: Secondary | ICD-10-CM | POA: Diagnosis not present

## 2016-01-18 DIAGNOSIS — M1990 Unspecified osteoarthritis, unspecified site: Secondary | ICD-10-CM | POA: Insufficient documentation

## 2016-01-18 DIAGNOSIS — Z79899 Other long term (current) drug therapy: Secondary | ICD-10-CM | POA: Diagnosis not present

## 2016-01-18 DIAGNOSIS — K31811 Angiodysplasia of stomach and duodenum with bleeding: Secondary | ICD-10-CM

## 2016-01-18 DIAGNOSIS — D62 Acute posthemorrhagic anemia: Secondary | ICD-10-CM

## 2016-01-18 DIAGNOSIS — K31819 Angiodysplasia of stomach and duodenum without bleeding: Secondary | ICD-10-CM | POA: Diagnosis not present

## 2016-01-18 DIAGNOSIS — K5521 Angiodysplasia of colon with hemorrhage: Secondary | ICD-10-CM | POA: Insufficient documentation

## 2016-01-18 DIAGNOSIS — K219 Gastro-esophageal reflux disease without esophagitis: Secondary | ICD-10-CM | POA: Insufficient documentation

## 2016-01-18 DIAGNOSIS — Z7951 Long term (current) use of inhaled steroids: Secondary | ICD-10-CM | POA: Diagnosis not present

## 2016-01-18 DIAGNOSIS — I272 Other secondary pulmonary hypertension: Secondary | ICD-10-CM | POA: Diagnosis not present

## 2016-01-18 DIAGNOSIS — Q2733 Arteriovenous malformation of digestive system vessel: Secondary | ICD-10-CM | POA: Diagnosis not present

## 2016-01-18 DIAGNOSIS — I10 Essential (primary) hypertension: Secondary | ICD-10-CM | POA: Diagnosis not present

## 2016-01-18 DIAGNOSIS — Z86711 Personal history of pulmonary embolism: Secondary | ICD-10-CM | POA: Diagnosis not present

## 2016-01-18 DIAGNOSIS — Z79890 Hormone replacement therapy (postmenopausal): Secondary | ICD-10-CM | POA: Diagnosis not present

## 2016-01-18 DIAGNOSIS — Z8719 Personal history of other diseases of the digestive system: Secondary | ICD-10-CM | POA: Diagnosis not present

## 2016-01-18 HISTORY — PX: ENTEROSCOPY: SHX5533

## 2016-01-18 SURGERY — ENTEROSCOPY
Anesthesia: Monitor Anesthesia Care

## 2016-01-18 MED ORDER — PHENYLEPHRINE HCL 10 MG/ML IJ SOLN
INTRAMUSCULAR | Status: DC | PRN
  Administered 2016-01-18: 40 ug via INTRAVENOUS

## 2016-01-18 MED ORDER — SODIUM CHLORIDE 0.9 % IV SOLN: INTRAVENOUS | Status: DC

## 2016-01-18 MED ORDER — PROPOFOL 500 MG/50ML IV EMUL
INTRAVENOUS | Status: DC | PRN
  Administered 2016-01-18 (×2): 30 mg via INTRAVENOUS

## 2016-01-18 MED ORDER — ONDANSETRON HCL 4 MG/2ML IJ SOLN
INTRAMUSCULAR | Status: AC
  Filled 2016-01-18: qty 6

## 2016-01-18 MED ORDER — LACTATED RINGERS IV SOLN
INTRAVENOUS | Status: DC | PRN
  Administered 2016-01-18: 08:00:00 via INTRAVENOUS

## 2016-01-18 MED ORDER — PROPOFOL 10 MG/ML IV BOLUS
INTRAVENOUS | Status: AC
  Filled 2016-01-18: qty 40

## 2016-01-18 MED ORDER — ONDANSETRON HCL 4 MG/2ML IJ SOLN
INTRAMUSCULAR | Status: DC | PRN
  Administered 2016-01-18: 4 mg via INTRAVENOUS

## 2016-01-18 MED ORDER — PROPOFOL 500 MG/50ML IV EMUL
INTRAVENOUS | Status: DC | PRN
  Administered 2016-01-18: 50 ug/kg/min via INTRAVENOUS

## 2016-01-18 MED ORDER — LACTATED RINGERS IV SOLN: INTRAVENOUS | Status: DC

## 2016-01-18 NOTE — Op Note (Signed)
[facility] Patient Name: Paula Ward Procedure Date: 01/18/2016 MRN: [id number] Attending MD: Mauri Pole , MD Date of Birth: 1936-07-21 CSN:  Age: 80 Admit Type: Outpatient Procedure:                Small bowel enteroscopy Indications:              Arteriovenous malformation in the stomach,                            Arteriovenous malformation in the small intestine Providers:                Mauri Pole, MD, Cleda Daub, RN, Despina Pole, Technician Referring MD:              Medicines:                Monitored Anesthesia Care Complications:            No immediate complications. Estimated Blood Loss:     Estimated blood loss: none. Procedure:                Pre-Anesthesia Assessment:                           - Prior to the procedure, a History and Physical                            was performed, and patient medications and                            allergies were reviewed. The patient's tolerance of                            previous anesthesia was also reviewed. The risks                            and benefits of the procedure and the sedation                            options and risks were discussed with the patient.                            All questions were answered, and informed consent                            was obtained. Prior Anticoagulants: The patient has                            taken no previous anticoagulant or antiplatelet                            agents. ASA Grade Assessment: IV - A patient with  severe systemic disease that is a constant threat                            to life. After reviewing the risks and benefits,                            the patient was deemed in satisfactory condition to                            undergo the procedure.                           After obtaining informed consent, the endoscope was                            passed  under direct vision. Throughout the                            procedure, the patient's blood pressure, pulse, and                            oxygen saturations were monitored continuously. The                            EC-[id number] ([id number]) scope was introduced through                            the mouth and advanced to the proximal jejunum. The                            small bowel enteroscopy was accomplished without                            difficulty. The patient tolerated the procedure                            well. Scope In: Scope Out: Findings:      Two angioectasias with no bleeding were found in the proximal jejunum.       Fulguration to ablate the lesion by argon plasma was successful.       Estimated blood loss: none.      One angioectasia with stigmata of recent bleeding was found in the       fourth portion of the duodenum. Fulguration to ablate the lesion by       argon plasma was successful.      The esophagus was normal.      The stomach was normal. Impression:               - Two non-bleeding angioectasias in the jejunum.                            Treated with argon plasma coagulation (APC).                           - One recently bleeding angioectasia in  the                            duodenum. Treated with argon plasma coagulation                            (APC).                           - Normal esophagus.                           - Normal stomach.                           - No specimens collected. Moderate Sedation:      N/A- Per Anesthesia Care Recommendation:            Procedure Code(s):        --- Professional ---                           [phone], 69, Small intestinal endoscopy, enteroscopy                            beyond second portion of duodenum, not including                            ileum; with control of bleeding (eg, injection,                            bipolar cautery, unipolar cautery, laser, heater                            probe,  stapler, plasma coagulator) Diagnosis Code(s):        --- Professional ---                           I94.85, Angiodysplasia of colon without hemorrhage                           K31.811, Angiodysplasia of stomach and duodenum                            with bleeding                           Q27.33, Arteriovenous malformation of digestive                            system vessel CPT copyright 2016 American [employer]. All rights reserved. The codes documented in this report are preliminary and upon coder review may  be revised to meet current compliance requirements. Mauri Pole, MD 01/18/2016 9:50:41 AM This report has been signed electronically. Number of Addenda: 0

## 2016-01-18 NOTE — Anesthesia Preprocedure Evaluation (Signed)
Anesthesia Evaluation  Patient identified by MRN, date of birth, ID band Patient awake    Reviewed: Allergy & Precautions, NPO status , Patient's Chart, lab work & pertinent test results  History of Anesthesia Complications (+) PONV and history of anesthetic complications  Airway Mallampati: II  TM Distance: >3 FB Neck ROM: Full    Dental  (+) Dental Advisory Given, Caps, Implants   Pulmonary  Pulmonary hypertension,; interstitial lung disease   Pulmonary exam normal breath sounds clear to auscultation       Cardiovascular hypertension, Pt. on medications  Rhythm:Regular Rate:Normal + Systolic murmurs Echo 11/2015: NORMAL LEFT VENTRICULAR SYSTOLIC FUNCTION WITH MILD LVH NORMAL RIGHT VENTRICULAR SYSTOLIC FUNCTIONVALVULAR REGURGITATION: TRIVIAL MR, TRIVIAL PR, MILD TRNO VALVULAR STENOSIS LARGE PERICARDIAL EFFUSION (See above) ESTIMATED RVSP=42mmHG LARGE PERICARDIAL EFFUSION WITH NO RESPIRATORY FLOW VARIATION AND NORMALIVC    Neuro/Psych negative neurological ROS     GI/Hepatic Neg liver ROS, GERD  Medicated,Gastric AVMs   Endo/Other  negative endocrine ROS  Renal/GU Renal InsufficiencyRenal disease     Musculoskeletal  (+) Arthritis , Osteoarthritis,    Abdominal   Peds  Hematology  (+) Blood dyscrasia, anemia ,   Anesthesia Other Findings Day of surgery medications reviewed with the patient.  CREST syndrome   Reproductive/Obstetrics                          Anesthesia Physical Anesthesia Plan  ASA: IV  Anesthesia Plan: MAC   Post-op Pain Management:    Induction: Intravenous  Airway Management Planned: Nasal Cannula  Additional Equipment:   Intra-op Plan:   Post-operative Plan:   Informed Consent: I have reviewed the patients History and Physical, chart, labs and discussed the procedure including the risks, benefits and alternatives for the proposed anesthesia with the  patient or authorized representative who has indicated his/her understanding and acceptance.   Dental advisory given  Plan Discussed with: CRNA and Anesthesiologist  Anesthesia Plan Comments: (Discussed risks/benefits/alternatives to MAC sedation including need for ventilatory support, hypotension, need for conversion to general anesthesia.  All patient questions answered.  Patient wished to proceed.)        Anesthesia Quick Evaluation  

## 2016-01-18 NOTE — Transfer of Care (Signed)
Immediate Anesthesia Transfer of Care Note  Patient: Paula Ward  Procedure(s) Performed: Procedure(s): ENTEROSCOPY (N/A)  Patient Location: PACU  Anesthesia Type:MAC  Level of Consciousness:  sedated, patient cooperative and responds to stimulation  Airway & Oxygen Therapy:Patient Spontanous Breathing and Patient connected to face mask oxgen  Post-op Assessment:  Report given to PACU RN and Post -op Vital signs reviewed and stable  Post vital signs:  Reviewed and stable  Last Vitals:  Filed Vitals:   01/18/16 0756 01/18/16 0926  BP:  134/44  Pulse: 79 80  Temp: 36.7 C   Resp: 10 19    Complications: No apparent anesthesia complications

## 2016-01-18 NOTE — H&P (View-Only) (Signed)
         Paula Ward    [id number]    11/05/1935  Primary Care Physician:Elizabeth A Crawford, MD  Referring Physician: Elizabeth A Crawford, MD [address] [place], [place] [address]  Chief complaint:  Melena  HPI: 79-year-old female with history of crest syndrome and pulmonary hypertension here for follow-up visit after a recent admission with possible upper GI bleed secondary to AVM status post APC. Patient continues to have melena, and is feeling tired. She went to the ER last night, was noted to have stable hemoglobin and was discharged home. Denies any nausea or vomiting or bright red blood per rectum. She was getting iron infusions in the past, last infusion in December 2015.   Outpatient Encounter Prescriptions as of 12/25/2015  Medication Sig  . acetaminophen (TYLENOL) 325 MG tablet Take 650 mg by mouth every 6 (six) hours as needed for mild pain.   . albuterol (PROVENTIL HFA;VENTOLIN HFA) 108 (90 BASE) MCG/ACT inhaler Inhale 2 puffs into the lungs every 6 (six) hours as needed for wheezing or shortness of breath.  . ALPRAZolam (XANAX) 0.25 MG tablet Take 0.5 tablets (0.125 mg total) by mouth at bedtime as needed. (Patient taking differently: Take 0.125 mg by mouth at bedtime as needed for sleep. )  . chlorhexidine (PERIDEX) 0.12 % solution swish mouth with 1/2 ounce for 60 seconds and spit out twice daily. DO NOT EA...  (REFER TO PRESCRIPTION NOTES).  . conjugated estrogens (PREMARIN) vaginal cream Place vaginally daily.  . Cyanocobalamin (VITAMIN B-12 PO) Take 1 tablet by mouth daily.   . dexamethasone 0.5 MG/5ML elixir   . diltiazem (CARDIZEM CD) 120 MG 24 hr capsule Take 120 mg by mouth daily.    . doxycycline (VIBRA-TABS) 100 MG tablet Take 1 tablet (100 mg total) by mouth every 12 (twelve) hours.  . fluticasone (FLONASE) 50 MCG/ACT nasal spray instill 1 spray into each nostril twice a day if needed  . furosemide (LASIX) 20 MG tablet Take 20 mg by mouth daily as  needed for fluid.   . guaiFENesin (MUCINEX) 600 MG 12 hr tablet Take 2 tablets (1,200 mg total) by mouth 2 (two) times daily.  . IRON PO Take 1 tablet by mouth daily. Pt uses Vitamin Code brand  . loratadine (CLARITIN) 10 MG tablet Take 10 mg by mouth daily.  . losartan (COZAAR) 25 MG tablet Take 25 mg by mouth daily.  . omeprazole (PRILOSEC) 40 MG capsule Take 1 capsule (40 mg total) by mouth 2 (two) times daily. (Patient taking differently: Take 40 mg by mouth daily. )  . OPSUMIT 10 MG TABS Take 1 tablet by mouth daily.   . Pyridoxine HCl (VITAMIN B6 PO) Take 1 tablet by mouth daily.   . triamcinolone cream (KENALOG) 0.1 %    No facility-administered encounter medications on file as of 12/25/2015.    Allergies as of 12/25/2015 - Review Complete 12/25/2015  Allergen Reaction Noted  . Codeine Nausea Only 08/03/2008  . Lisinopril Other (See Comments) 12/14/2014  . Other Nausea And Vomiting 04/05/2012    Past Medical History  Diagnosis Date  . Anemia   . Pulmonary embolus (HCC) 2003  . CREST syndrome (HCC)   . GERD (gastroesophageal reflux disease)     w/ HH  . Angiodysplasia of stomach and duodenum   . Arthus phenomenon   . Pulmonary hypertension (HCC)     followed by Dr Tapson at [facility]  . Interstitial lung disease (HCC)   .   Scleroderma (HCC)   . Rectal incontinence   . AVM (arteriovenous malformation)   . Nodule of kidney   . Uterine polyp   . Chronic kidney disease     Chronic mild renal insuffiency  . Breast cancer (HCC) 1989    Left  . Renal cell carcinoma (HCC)   . Candida esophagitis (HCC)   . Cataract   . Tubular adenoma of colon   . Blood transfusion without reported diagnosis   . PONV (postoperative nausea and vomiting)     Past Surgical History  Procedure Laterality Date  . Tonsillectomy    . Appendectomy  1962  . Breast lumpectomy  1989    left  . Ivc filter    . Kidney surgery      left   . Cataract extraction, bilateral Bilateral 12/2013  .  Esophagogastroduodenoscopy (egd) with propofol N/A 12/21/2015    Procedure: ESOPHAGOGASTRODUODENOSCOPY (EGD) WITH PROPOFOL;  Surgeon: John N Perry, MD;  Location: [facility];  Service: Endoscopy;  Laterality: N/A;    Family History  Problem Relation Age of Onset  . Bladder Cancer Father   . Diabetes Father   . Prostate cancer Father   . Alzheimer's disease Mother   . Colon cancer Neg Hx   . Lung cancer Sister   . Diabetes Sister   . Lung cancer Sister      smoker  . Esophageal cancer Paternal Uncle     Social History   Social History  . Marital Status: Married    Spouse Name: N/A  . Number of Children: N/A  . Years of Education: N/A   Occupational History  . Retired    Social History Main Topics  . Smoking status: Never Smoker   . Smokeless tobacco: Never Used  . Alcohol Use: No  . Drug Use: No  . Sexual Activity: No   Other Topics Concern  . Not on file   Social History Narrative   Married '61   1 son- '65, 1 daughter '63; 6 children (2 adopted)   SO- SOB   Retirement- doing well   Marriage in good health   Patient has never smoked   Alcohol use- no   Pt gets regular exercise            Review of systems: Review of Systems  Constitutional: Negative for fever and chills.  HENT: Negative.   Eyes: Negative for blurred vision.  Respiratory: Negative for cough, shortness of breath and wheezing.   Cardiovascular: Negative for chest pain and palpitations.  Gastrointestinal: as per HPI Genitourinary: Negative for dysuria, urgency, frequency and hematuria.  Musculoskeletal: Negative for myalgias, back pain and joint pain.  Skin: Negative for itching and rash.  Neurological: Negative for dizziness, tremors, focal weakness, seizures and loss of consciousness.  Endo/Heme/Allergies: Negative for environmental allergies.  Psychiatric/Behavioral: Negative for depression, suicidal ideas and hallucinations.  All other systems reviewed and are  negative.   Physical Exam: Filed Vitals:   12/25/15 1450  BP: 100/58  Pulse: 84   Gen:      No acute distress HEENT:  EOMI, sclera anicteric Neck:     No masses; no thyromegaly Lungs:    Clear to auscultation bilaterally; normal respiratory effort CV:         Regular rate and rhythm; no murmurs Abd:      + bowel sounds; soft, non-tender; no palpable masses, no distension Ext:    No edema; adequate peripheral perfusion Skin:        Warm and dry; no rash Neuro: alert and oriented x 3 Psych: normal mood and affect  Data Reviewed: Colonoscopy 06/2014 1. Sessile polyp was found in the ascending colon; polypectomy was performed with cold forceps 2. There was moderate diverticulosis noted throughout the entire examined colon ,more severe in the left colon 3. no evidence of AVMs 4. nothing to account for right lower quadrant abdominal pain. Suspect adhesions  EGD 12/21/2015 1. Small gastric and duodenal AVMs ablated with APC.No blood or active bleeding in upper gut to jejunum 2. Incidental proximal gastric diverticulum. Otherwise unremarkable exam   Assessment and Plan/Recommendations:  79-year-old female with history of crest syndrome, pulmonary hypertension and gastrointestinal AVMs here for follow-up visit after recent admission with possible upper GI bleed secondary to upper gastrointestinal tract bleeding AVM She continues to have melena, could be passing old blood or having ongoing recurrent GI bleed We'll recheck CBC If she continues to have melena or dark stool will consider small bowel video capsule to evaluate for possible small bowel bleeding AVM and may need enteroscopy based on the results We will also schedule patient for iron infusions Return in 1 month or earlier if needed  K. Veena Nandigam , MD [phone] Mon-Fri 8a-5p [phone] after 5p, weekends, holidays    

## 2016-01-18 NOTE — Interval H&P Note (Signed)
History and Physical Interval Note:  01/18/2016 8:42 AM  Paula Ward  has presented today for surgery, with the diagnosis of anemia and persistent melena. Will proceed with enteroscopy with possible APC for treatement of upper GI tract AVM melena  The various methods of treatment have been discussed with the patient and family. After consideration of risks, benefits and other options for treatment, the patient has consented to  Procedure(s): ENTEROSCOPY (N/A) as a surgical intervention .  The patient's history has been reviewed, patient examined, no change in status, stable for surgery.  I have reviewed the patient's chart and labs.  Questions were answered to the patient's satisfaction.     Kavitha Nandigam

## 2016-01-18 NOTE — Discharge Instructions (Signed)
Monitored Anesthesia Care Monitored anesthesia care is an anesthesia service for a medical procedure. Anesthesia is the loss of the ability to feel pain. It is produced by medicines called anesthetics. It may affect a small area of your body (local anesthesia), a large area of your body (regional anesthesia), or your entire body (general anesthesia). The need for monitored anesthesia care depends your procedure, your condition, and the potential need for regional or general anesthesia. It is often provided during procedures where:   General anesthesia may be needed if there are complications. This is because you need special care when you are under general anesthesia.   You will be under local or regional anesthesia. This is so that you are able to have higher levels of anesthesia if needed.   You will receive calming medicines (sedatives). This is especially the case if sedatives are given to put you in a semi-conscious state of relaxation (deep sedation). This is because the amount of sedative needed to produce this state can be hard to predict. Too much of a sedative can produce general anesthesia. Monitored anesthesia care is performed by one or more health care providers who have special training in all types of anesthesia. You will need to meet with these health care providers before your procedure. During this meeting, they will ask you about your medical history. They will also give you instructions to follow. (For example, you will need to stop eating and drinking before your procedure. You may also need to stop or change medicines you are taking.) During your procedure, your health care providers will stay with you. They will:   Watch your condition. This includes watching your blood pressure, breathing, and level of pain.   Diagnose and treat problems that occur.   Give medicines if they are needed. These may include calming medicines (sedatives) and anesthetics.   Make sure you are  comfortable.  Having monitored anesthesia care does not necessarily mean that you will be under anesthesia. It does mean that your health care providers will be able to manage anesthesia if you need it or if it occurs. It also means that you will be able to have a different type of anesthesia than you are having if you need it. When your procedure is complete, your health care providers will continue to watch your condition. They will make sure any medicines wear off before you are allowed to go home.    This information is not intended to replace advice given to you by your health care provider. Make sure you discuss any questions you have with your health care provider.   Document Released: 07/02/2005 Document Revised: 10/27/2014 Document Reviewed: 11/17/2012 Elsevier Interactive Patient Education 2016 [place].   Esophagogastroduodenoscopy, Care After Refer to this sheet in the next few weeks. These instructions provide you with information about caring for yourself after your procedure. Your health care provider may also give you more specific instructions. Your treatment has been planned according to current medical practices, but problems sometimes occur. Call your health care provider if you have any problems or questions after your procedure. WHAT TO EXPECT AFTER THE PROCEDURE After your procedure, it is typical to feel:  Soreness in your throat.  Pain with swallowing.  Sick to your stomach (nauseous).  Bloated.  Dizzy.  Fatigued. HOME CARE INSTRUCTIONS  Do not eat or drink anything until the numbing medicine (local anesthetic) has worn off and your gag reflex has returned. You will know that the local anesthetic has  worn off when you can swallow comfortably.  Do not drive or operate machinery until directed by your health care provider.  Take medicines only as directed by your health care provider. SEEK MEDICAL CARE IF:   You cannot stop coughing.  You are not  urinating at all or less than usual. SEEK IMMEDIATE MEDICAL CARE IF:  You have difficulty swallowing.  You cannot eat or drink.  You have worsening throat or chest pain.  You have dizziness or lightheadedness or you faint.  You have nausea or vomiting.  You have chills.  You have a fever.  You have severe abdominal pain.  You have black, tarry, or bloody stools.   This information is not intended to replace advice given to you by your health care provider. Make sure you discuss any questions you have with your health care provider.   Document Released: 09/22/2012 Document Revised: 10/27/2014 Document Reviewed: 09/22/2012 Elsevier Interactive Patient Education [employer].

## 2016-01-18 NOTE — Anesthesia Postprocedure Evaluation (Signed)
Anesthesia Post Note  Patient: Paula Ward  Procedure(s) Performed: Procedure(s) (LRB): ENTEROSCOPY (N/A)  Patient location during evaluation: PACU Anesthesia Type: MAC Level of consciousness: awake and alert Pain management: pain level controlled Vital Signs Assessment: post-procedure vital signs reviewed and stable Respiratory status: spontaneous breathing, nonlabored ventilation, respiratory function stable and patient connected to nasal cannula oxygen Cardiovascular status: stable and blood pressure returned to baseline Anesthetic complications: no    Last Vitals:  Filed Vitals:   01/18/16 0950 01/18/16 1000  BP: 120/30 116/45  Pulse: 74 79  Temp:    Resp: 18 15    Last Pain: There were no vitals filed for this visit.               Tiajuana Amass

## 2016-01-20 ENCOUNTER — Encounter (HOSPITAL_COMMUNITY): Payer: Self-pay | Admitting: Gastroenterology

## 2016-01-21 ENCOUNTER — Ambulatory Visit (INDEPENDENT_AMBULATORY_CARE_PROVIDER_SITE_OTHER): Payer: Medicare Other | Admitting: Cardiology

## 2016-01-21 ENCOUNTER — Encounter: Payer: Self-pay | Admitting: Cardiology

## 2016-01-21 ENCOUNTER — Other Ambulatory Visit: Payer: Self-pay

## 2016-01-21 VITALS — BP 112/52 | HR 78 | Ht 64.0 in | Wt 127.2 lb

## 2016-01-21 DIAGNOSIS — I3139 Other pericardial effusion (noninflammatory): Secondary | ICD-10-CM

## 2016-01-21 DIAGNOSIS — I272 Other secondary pulmonary hypertension: Secondary | ICD-10-CM | POA: Diagnosis not present

## 2016-01-21 DIAGNOSIS — I10 Essential (primary) hypertension: Secondary | ICD-10-CM | POA: Diagnosis not present

## 2016-01-21 DIAGNOSIS — K558 Other vascular disorders of intestine: Secondary | ICD-10-CM | POA: Diagnosis not present

## 2016-01-21 DIAGNOSIS — K5521 Angiodysplasia of colon with hemorrhage: Secondary | ICD-10-CM

## 2016-01-21 DIAGNOSIS — I319 Disease of pericardium, unspecified: Secondary | ICD-10-CM | POA: Diagnosis not present

## 2016-01-21 DIAGNOSIS — D62 Acute posthemorrhagic anemia: Secondary | ICD-10-CM

## 2016-01-21 DIAGNOSIS — I313 Pericardial effusion (noninflammatory): Secondary | ICD-10-CM

## 2016-01-21 NOTE — Assessment & Plan Note (Signed)
Blood pressure controlled. Continue present medications. 

## 2016-01-21 NOTE — Assessment & Plan Note (Signed)
Patient is complaining of increased dyspnea on exertion.This may be related to recent GI bleed and anemia but she also has had a previous large pericardial effusion. We will repeat echocardiogram to make sure she has not developed hemodynamic significance. She may require pericardiocentesis in the future. Her pericardial effusion is felt secondary to crest syndrome. Note she also has a remote history of breast cancer.

## 2016-01-21 NOTE — Assessment & Plan Note (Signed)
Followed by gastroenterology. Her hemoglobin is checked monthly by primary care.

## 2016-01-21 NOTE — Assessment & Plan Note (Addendum)
Patient is followed at [facility] and symptoms have been well controlled. Continue opsumit. She will continue to be followed there as well. Note approximately 30 minutes spent reviewing previous records from [facility] and here prior to patient visit.

## 2016-01-21 NOTE — Patient Instructions (Signed)
Your physician has requested that you have an echocardiogram. Echocardiography is a painless test that uses sound waves to create images of your heart. It provides your doctor with information about the size and shape of your heart and how well your heart's chambers and valves are working. This procedure takes approximately one hour. There are no restrictions for this procedure.   Your physician wants you to follow-up in: 3 [place] will receive a reminder letter in the mail two months in advance. If you don't receive a letter, please call our office to schedule the follow-up appointment.

## 2016-01-28 ENCOUNTER — Encounter: Payer: Self-pay | Admitting: Internal Medicine

## 2016-01-28 ENCOUNTER — Telehealth: Payer: Self-pay

## 2016-01-28 NOTE — Telephone Encounter (Signed)
I have left a message with Kinnie Scales, assistant to Dr Daine Gravel. He is the pulmonologist for the patient. Dr Silverio Decamp would like to discuss her case with him. I need a phone number to contact him.

## 2016-01-29 ENCOUNTER — Other Ambulatory Visit: Payer: Self-pay

## 2016-01-29 DIAGNOSIS — D500 Iron deficiency anemia secondary to blood loss (chronic): Secondary | ICD-10-CM

## 2016-01-30 ENCOUNTER — Encounter: Payer: Self-pay | Admitting: Gastroenterology

## 2016-01-30 ENCOUNTER — Encounter: Payer: Self-pay | Admitting: Cardiology

## 2016-01-30 ENCOUNTER — Ambulatory Visit (INDEPENDENT_AMBULATORY_CARE_PROVIDER_SITE_OTHER): Payer: Medicare Other | Admitting: Gastroenterology

## 2016-01-30 VITALS — BP 110/52 | HR 72 | Ht 64.0 in | Wt 126.8 lb

## 2016-01-30 DIAGNOSIS — K558 Other vascular disorders of intestine: Secondary | ICD-10-CM | POA: Diagnosis not present

## 2016-01-30 DIAGNOSIS — D649 Anemia, unspecified: Secondary | ICD-10-CM | POA: Diagnosis not present

## 2016-01-30 DIAGNOSIS — K5520 Angiodysplasia of colon without hemorrhage: Secondary | ICD-10-CM

## 2016-01-30 NOTE — Progress Notes (Signed)
Paula Ward    [id number]    1936/02/02  Primary Care Physician:Elizabeth Becky [place], MD  Referring Physician: Hoyt Koch, MD [place], [place] [id number]  Chief complaint:  Iron deficiency anemia due to chronic GI blood loss   HPI: 80 year old female with history of scleroderma, pulmonary hypertension, small intestinal angioectasia with chronic GI blood loss and iron deficiency anemia here for follow-up visit. She had enteroscopy with APC of 1 angiectasia and duodenum and 2 in jejunum. She is no longer having melena. Reports intermittent bloating and feels is getting constipated. Denies any nausea, vomiting, abdominal pain, or bright red blood per rectum. She feels her energy level is better since she is getting iron infusions.     Outpatient Encounter Prescriptions as of 01/30/2016  Medication Sig  . acetaminophen (TYLENOL) 325 MG tablet Take 650 mg by mouth every 6 (six) hours as needed for mild pain.   Marland Kitchen albuterol (PROVENTIL HFA;VENTOLIN HFA) 108 (90 BASE) MCG/ACT inhaler Inhale 2 puffs into the lungs every 6 (six) hours as needed for wheezing or shortness of breath.  . ALPRAZolam (XANAX) 0.25 MG tablet Take 0.5 tablets (0.125 mg total) by mouth at bedtime as needed. (Patient taking differently: Take 0.125 mg by mouth at bedtime as needed for sleep. )  . chlorhexidine (PERIDEX) 0.12 % solution swish mouth with 1/2 ounce for 60 seconds and spit out once daily. DO NOT EA...  (REFER TO PRESCRIPTION NOTES).  . Cyanocobalamin (VITAMIN B-12 PO) Take 1 tablet by mouth daily.   Marland Kitchen diltiazem (CARDIZEM CD) 120 MG 24 hr capsule Take 120 mg by mouth daily.    . fluticasone (FLONASE) 50 MCG/ACT nasal spray instill 1 spray into each nostril twice a day if needed  . furosemide (LASIX) 20 MG tablet Take 20 mg by mouth daily as needed for fluid.   . IRON PO Take 1 tablet by mouth daily. Pt uses Vitamin Code brand  . loratadine (CLARITIN) 10 MG tablet Take 10 mg  by mouth daily as needed for allergies.   Marland Kitchen losartan (COZAAR) 25 MG tablet Take 25 mg by mouth daily as needed (Pt is instructed by doctor to take based on kidney lab work).   Marland Kitchen omeprazole (PRILOSEC) 40 MG capsule Take 1 capsule (40 mg total) by mouth 2 (two) times daily. (Patient taking differently: Take 40 mg by mouth daily. )  . OPSUMIT 10 MG TABS Take 1 tablet by mouth daily.   . Pyridoxine HCl (VITAMIN B6 PO) Take 1 tablet by mouth daily.   Marland Kitchen triamcinolone cream (KENALOG) 0.1 % Apply 1 application topically daily as needed (Irritated skin).    No facility-administered encounter medications on file as of 01/30/2016.    Allergies as of 01/30/2016 - Review Complete 01/30/2016  Allergen Reaction Noted  . Codeine Nausea Only 08/03/2008  . Lisinopril Other (See Comments) 12/14/2014  . Other Nausea And Vomiting 04/05/2012    Past Medical History  Diagnosis Date  . Anemia   . Pulmonary embolus ([place]) 2003  . CREST syndrome ([place])   . GERD (gastroesophageal reflux disease)     w/ HH  . Angiodysplasia of stomach and duodenum   . Arthus phenomenon   . Pulmonary hypertension ([place])     followed by Dr Gaynell Face at [facility], now Dr. Maryjean Ka visit end 2'17.  . Interstitial lung disease ([place])   . Scleroderma ([place])   . Rectal incontinence   .  AVM (arteriovenous malformation)   . Nodule of kidney   . Uterine polyp   . Chronic kidney disease     Chronic mild renal insuffiency  . Breast cancer ([place]) 1989    Left  . Renal cell carcinoma ([place])   . Candida esophagitis ([place])   . Cataract   . Tubular adenoma of colon   . Blood transfusion without reported diagnosis     last 4 units 12-22-15, Iron infusion x2 last -01-07-16,01-14-16.  Marland Kitchen PONV (postoperative nausea and vomiting)     Past Surgical History  Procedure Laterality Date  . Tonsillectomy    . Appendectomy  1962  . Breast lumpectomy  1989    left  . Ivc filter    . Kidney surgery      left -"laser surgery by Dr. Kathlene Cote- 5 yrs ago"  no removal  . Cataract extraction, bilateral Bilateral 12/2013  . Esophagogastroduodenoscopy (egd) with propofol N/A 12/21/2015    Procedure: ESOPHAGOGASTRODUODENOSCOPY (EGD) WITH PROPOFOL;  Surgeon: Irene Shipper, MD;  Location: [facility] END[facility];  Service: Endoscopy;  Laterality: N/A;  . Tubal ligation    . Enteroscopy N/A 01/18/2016    Procedure: ENTEROSCOPY;  Surgeon: Mauri Pole, MD;  Location: [facility] ENDOS[facility];  Service: Endoscopy;  Laterality: N/A;    Family History  Problem Relation Age of Onset  . Bladder Cancer Father   . Diabetes Father   . Prostate cancer Father   . Alzheimer's disease Mother   . Colon cancer Neg Hx   . Lung cancer Sister   . Diabetes Sister   . Lung cancer Sister      smoker  . Esophageal cancer Paternal Uncle     Social History   Social History  . Marital Status: Married    Spouse Name: N/A  . Number of Children: 2  . Years of Education: N/A   Occupational History  . Retired    Social History Main Topics  . Smoking status: Never Smoker   . Smokeless tobacco: Never Used  . Alcohol Use: No  . Drug Use: No  . Sexual Activity: No   Other Topics Concern  . Not on file   Social History Narrative   Married '61   1 son- '65, 1 daughter '63; 6 children (2 adopted)   SO- SOB   Retirement- doing well   Marriage in good health   Patient has never smoked   Alcohol use- no   Pt gets regular exercise            Review of systems: Review of Systems  Constitutional: Negative for fever and chills.  HENT: Negative.   Eyes: Negative for blurred vision.  Respiratory: Negative for cough, shortness of breath and wheezing.   Cardiovascular: Negative for chest pain and palpitations.  Gastrointestinal: as per HPI Genitourinary: Negative for dysuria, urgency, frequency and hematuria.  Musculoskeletal: Negative for myalgias, back pain and joint pain.  Skin: Negative for itching and rash.  Neurological: Negative for dizziness, tremors, focal weakness,  seizures and loss of consciousness.  Endo/Heme/Allergies: Negative for environmental allergies.  Psychiatric/Behavioral: Negative for depression, suicidal ideas and hallucinations.  All other systems reviewed and are negative.   Physical Exam: Filed Vitals:   01/30/16 1553  BP: 110/52  Pulse: 72   Gen:      No acute distress HEENT:  EOMI, sclera anicteric Neck:     No masses; no thyromegaly Lungs:    Clear to auscultation bilaterally; normal respiratory effort CV:  Regular rate and rhythm;  Abd:      + bowel sounds; soft, non-tender; no palpable masses, no distension Ext:    No edema; adequate peripheral perfusion Skin:      Warm and dry; no rash Neuro: alert and oriented x 3 Psych: normal mood and affect  Data Reviewed:  Reviewed chart in epic   Assessment and Plan/Recommendations: 80 year old female with history of scleroderma, pulmonary hypertension, small intestinal angioectasia here for follow up visit Cont iron infusions and check CBC and ferritin Qmonthly Will hold endoscopic therapy unless has signs of overt GI bleed, given high risk for procedural and sedation related complications Will also discuss with her pulmonologist Dr Yehuda Budd regarding starting Sandostatin (octreotide) to decrease the frequency of bleeding from small intestinal AVM's Return in 3 months  K. Denzil Magnuson , MD [phone] Mon-Fri 8a-5p [phone] after 5p, weekends, holidays

## 2016-01-30 NOTE — Patient Instructions (Signed)
You will have follow up labs drawn on or around 6/15 Your follow up appointment with Dr Silverio Decamp is 04/07/2016 at 2:45pm

## 2016-02-05 NOTE — Telephone Encounter (Signed)
Per Mrs. Owens Shark, to call the Rajagopal's assistant, Amy, the number is [phone]. Push option #4 when the recording gets to the lists of nurses.

## 2016-02-05 NOTE — Telephone Encounter (Signed)
Ok, I will call Dr Yehuda Budd

## 2016-02-06 ENCOUNTER — Other Ambulatory Visit (HOSPITAL_COMMUNITY): Payer: Medicare Other

## 2016-02-08 ENCOUNTER — Encounter (HOSPITAL_COMMUNITY): Payer: Self-pay

## 2016-02-08 ENCOUNTER — Encounter (HOSPITAL_COMMUNITY)
Admission: RE | Admit: 2016-02-08 | Discharge: 2016-02-08 | Disposition: A | Discharge status: Home / Self Care | Payer: Medicare Other | Source: Ambulatory Visit | Attending: Gastroenterology | Admitting: Gastroenterology

## 2016-02-08 VITALS — BP 129/49 | HR 83 | Temp 97.8°F | Resp 16 | Wt 129.2 lb

## 2016-02-08 DIAGNOSIS — D62 Acute posthemorrhagic anemia: Secondary | ICD-10-CM | POA: Insufficient documentation

## 2016-02-08 DIAGNOSIS — D500 Iron deficiency anemia secondary to blood loss (chronic): Secondary | ICD-10-CM

## 2016-02-08 MED ORDER — SODIUM CHLORIDE 0.9 % IV SOLN
510.0000 mg | Freq: Once | INTRAVENOUS | Status: AC
  Administered 2016-02-08: 510 mg via INTRAVENOUS
  Filled 2016-02-08: qty 17

## 2016-02-08 MED ORDER — SODIUM CHLORIDE 0.9 % IV SOLN
Freq: Every day | INTRAVENOUS | Status: DC
  Administered 2016-02-08: 13:00:00 via INTRAVENOUS

## 2016-02-08 MED ORDER — SODIUM CHLORIDE 0.9 % IV SOLN: 510.0000 mg | Freq: Every day | INTRAVENOUS | Status: DC

## 2016-02-08 NOTE — Discharge Instructions (Signed)
FERAHEME Ferumoxytol injection What is this medicine? FERUMOXYTOL is an iron complex. Iron is used to make healthy red blood cells, which carry oxygen and nutrients throughout the body. This medicine is used to treat iron deficiency anemia in people with chronic kidney disease. This medicine may be used for other purposes; ask your health care provider or pharmacist if you have questions. What should I tell my health care provider before I take this medicine? They need to know if you have any of these conditions: -anemia not caused by low iron levels -high levels of iron in the blood -magnetic resonance imaging (MRI) test scheduled -an unusual or allergic reaction to iron, other medicines, foods, dyes, or preservatives -pregnant or trying to get pregnant -breast-feeding How should I use this medicine? This medicine is for injection into a vein. It is given by a health care professional in a hospital or clinic setting. Talk to your pediatrician regarding the use of this medicine in children. Special care may be needed. Overdosage: If you think you have taken too much of this medicine contact a poison control center or emergency room at once. NOTE: This medicine is only for you. Do not share this medicine with others. What if I miss a dose? It is important not to miss your dose. Call your doctor or health care professional if you are unable to keep an appointment. What may interact with this medicine? This medicine may interact with the following medications: -other iron products This list may not describe all possible interactions. Give your health care provider a list of all the medicines, herbs, non-prescription drugs, or dietary supplements you use. Also tell them if you smoke, drink alcohol, or use illegal drugs. Some items may interact with your medicine. What should I watch for while using this medicine? Visit your doctor or healthcare professional regularly. Tell your doctor or  healthcare professional if your symptoms do not start to get better or if they get worse. You may need blood work done while you are taking this medicine. You may need to follow a special diet. Talk to your doctor. Foods that contain iron include: whole grains/cereals, dried fruits, beans, or peas, leafy green vegetables, and organ meats (liver, kidney). What side effects may I notice from receiving this medicine? Side effects that you should report to your doctor or health care professional as soon as possible: -allergic reactions like skin rash, itching or hives, swelling of the face, lips, or tongue -breathing problems -changes in blood pressure -feeling faint or lightheaded, falls -fever or chills -flushing, sweating, or hot feelings -swelling of the ankles or feet Side effects that usually do not require medical attention (Report these to your doctor or health care professional if they continue or are bothersome.): -diarrhea -headache -nausea, vomiting -stomach pain This list may not describe all possible side effects. Call your doctor for medical advice about side effects. You may report side effects to FDA at 1-800-FDA-1088. Where should I keep my medicine? This drug is given in a hospital or clinic and will not be stored at home. NOTE: This sheet is a summary. It may not cover all possible information. If you have questions about this medicine, talk to your doctor, pharmacist, or health care provider.    2016, Elsevier/Gold Standard. (2012-05-21 15:23:36) Anemia, Nonspecific Anemia is a condition in which the concentration of red blood cells or hemoglobin in the blood is below normal. Hemoglobin is a substance in red blood cells that carries oxygen to the tissues  of the body. Anemia results in not enough oxygen reaching these tissues.  CAUSES  Common causes of anemia include:   Excessive bleeding. Bleeding may be internal or external. This includes excessive bleeding from periods  (in women) or from the intestine.   Poor nutrition.   Chronic kidney, thyroid, and liver disease.  Bone marrow disorders that decrease red blood cell production.  Cancer and treatments for cancer.  HIV, AIDS, and their treatments.  Spleen problems that increase red blood cell destruction.  Blood disorders.  Excess destruction of red blood cells due to infection, medicines, and autoimmune disorders. SIGNS AND SYMPTOMS   Minor weakness.   Dizziness.   Headache.  Palpitations.   Shortness of breath, especially with exercise.   Paleness.  Cold sensitivity.  Indigestion.  Nausea.  Difficulty sleeping.  Difficulty concentrating. Symptoms may occur suddenly or they may develop slowly.  DIAGNOSIS  Additional blood tests are often needed. These help your health care provider determine the best treatment. Your health care provider will check your stool for blood and look for other causes of blood loss.  TREATMENT  Treatment varies depending on the cause of the anemia. Treatment can include:   Supplements of iron, vitamin V03, or folic acid.   Hormone medicines.   A blood transfusion. This may be needed if blood loss is severe.   Hospitalization. This may be needed if there is significant continual blood loss.   Dietary changes.  Spleen removal. HOME CARE INSTRUCTIONS Keep all follow-up appointments. It often takes many weeks to correct anemia, and having your health care provider check on your condition and your response to treatment is very important. SEEK IMMEDIATE MEDICAL CARE IF:   You develop extreme weakness, shortness of breath, or chest pain.   You become dizzy or have trouble concentrating.  You develop heavy vaginal bleeding.   You develop a rash.   You have bloody or black, tarry stools.   You faint.   You vomit up blood.   You vomit repeatedly.   You have abdominal pain.  You have a fever or persistent symptoms for  more than 2-3 days.   You have a fever and your symptoms suddenly get worse.   You are dehydrated.  MAKE SURE YOU:  Understand these instructions.  Will watch your condition.  Will get help right away if you are not doing well or get worse.   This information is not intended to replace advice given to you by your health care provider. Make sure you discuss any questions you have with your health care provider.   Document Released: 11/13/2004 Document Revised: 06/08/2013 Document Reviewed: 04/01/2013 Elsevier Interactive Patient Education [employer].

## 2016-02-13 ENCOUNTER — Other Ambulatory Visit (INDEPENDENT_AMBULATORY_CARE_PROVIDER_SITE_OTHER): Payer: Medicare Other

## 2016-02-13 DIAGNOSIS — I272 Other secondary pulmonary hypertension: Secondary | ICD-10-CM

## 2016-02-13 LAB — CBC
HEMATOCRIT: 31.6 % — AB (ref 36.0–46.0)
Hemoglobin: 10.6 g/dL — ABNORMAL LOW (ref 12.0–15.0)
MCHC: 33.4 g/dL (ref 30.0–36.0)
MCV: 99.6 fl (ref 78.0–100.0)
Platelets: 231 10*3/uL (ref 150.0–400.0)
RBC: 3.17 Mil/uL — AB (ref 3.87–5.11)
RDW: 18.4 % — ABNORMAL HIGH (ref 11.5–15.5)
WBC: 6.8 10*3/uL (ref 4.0–10.5)

## 2016-02-13 LAB — COMPREHENSIVE METABOLIC PANEL
ALT: 13 U/L (ref 0–35)
AST: 23 U/L (ref 0–37)
Albumin: 4.3 g/dL (ref 3.5–5.2)
Alkaline Phosphatase: 44 U/L (ref 39–117)
BILIRUBIN TOTAL: 0.4 mg/dL (ref 0.2–1.2)
BUN: 29 mg/dL — AB (ref 6–23)
CALCIUM: 9.5 mg/dL (ref 8.4–10.5)
CHLORIDE: 105 meq/L (ref 96–112)
CO2: 27 meq/L (ref 19–32)
CREATININE: 1.27 mg/dL — AB (ref 0.40–1.20)
GFR: 43.31 mL/min — ABNORMAL LOW (ref >60.00)
GLUCOSE: 115 mg/dL — AB (ref 70–99)
Potassium: 4.7 mEq/L (ref 3.5–5.1)
SODIUM: 139 meq/L (ref 135–145)
Total Protein: 7 g/dL (ref 6.0–8.3)

## 2016-02-19 ENCOUNTER — Encounter: Payer: Self-pay | Admitting: Gastroenterology

## 2016-02-21 ENCOUNTER — Other Ambulatory Visit: Payer: Self-pay

## 2016-02-21 ENCOUNTER — Telehealth: Payer: Self-pay | Admitting: *Deleted

## 2016-02-21 DIAGNOSIS — D509 Iron deficiency anemia, unspecified: Secondary | ICD-10-CM

## 2016-02-21 NOTE — Telephone Encounter (Signed)
Paula Ward is working on setting up patient with [facility] for monthly Sandostatin injections. She will get her next iron injection end of May and will check CBC, iron studies 2 days prior to the iron injections

## 2016-02-21 NOTE — Telephone Encounter (Signed)
---  Message -----    From: [name]    Sent: 02/19/2016  5:21 PM EDT      To: Harl Bowie, MD Subject: Non-Urgent Medical Question  Beth  Do you know if Dr. Marchelle Gearing  called in a prescription for me to take the shot she was telling me about.  I have checked with my [facility]. but they have not received a prescription from her.  Thank you for checking into this for me.  Enid Derry     Dr Silverio Decamp Please advise, I read your note I didn't see any information about a shot to send in for patient   Thanks

## 2016-02-25 ENCOUNTER — Telehealth: Payer: Self-pay | Admitting: Gastroenterology

## 2016-02-25 NOTE — Telephone Encounter (Signed)
Spoke with the patient about the Sandostatin. She declines the injections at this time. She does not like the cost and is not comfortable with the medication. She would like to continue the iron transfusions.

## 2016-02-25 NOTE — Telephone Encounter (Signed)
Patient has declines Sandostatin injections

## 2016-02-26 NOTE — Telephone Encounter (Signed)
ok 

## 2016-02-27 ENCOUNTER — Encounter: Payer: Self-pay | Admitting: Gastroenterology

## 2016-02-28 ENCOUNTER — Other Ambulatory Visit: Payer: Self-pay

## 2016-02-28 ENCOUNTER — Ambulatory Visit: Payer: Medicare Other | Admitting: Internal Medicine

## 2016-02-28 DIAGNOSIS — D62 Acute posthemorrhagic anemia: Secondary | ICD-10-CM

## 2016-03-03 ENCOUNTER — Other Ambulatory Visit (INDEPENDENT_AMBULATORY_CARE_PROVIDER_SITE_OTHER): Payer: Medicare Other

## 2016-03-03 DIAGNOSIS — D62 Acute posthemorrhagic anemia: Secondary | ICD-10-CM

## 2016-03-03 LAB — CBC WITH DIFFERENTIAL/PLATELET
BASOS ABS: 0 10*3/uL (ref 0.0–0.1)
BASOS PCT: 0.4 % (ref 0.0–3.0)
EOS ABS: 0.1 10*3/uL (ref 0.0–0.7)
Eosinophils Relative: 0.6 % (ref 0.0–5.0)
HCT: 33.1 % — ABNORMAL LOW (ref 36.0–46.0)
Hemoglobin: 11.1 g/dL — ABNORMAL LOW (ref 12.0–15.0)
LYMPHS PCT: 20.3 % (ref 12.0–46.0)
Lymphs Abs: 1.6 10*3/uL (ref 0.7–4.0)
MCHC: 33.4 g/dL (ref 30.0–36.0)
MCV: 100.3 fl — ABNORMAL HIGH (ref 78.0–100.0)
MONO ABS: 0.4 10*3/uL (ref 0.1–1.0)
Monocytes Relative: 5.3 % (ref 3.0–12.0)
NEUTROS PCT: 73.4 % (ref 43.0–77.0)
Neutro Abs: 5.9 10*3/uL (ref 1.4–7.7)
PLATELETS: 226 10*3/uL (ref 150.0–400.0)
RBC: 3.3 Mil/uL — ABNORMAL LOW (ref 3.87–5.11)
RDW: 16.4 % — AB (ref 11.5–15.5)
WBC: 8.1 10*3/uL (ref 4.0–10.5)

## 2016-03-03 LAB — FERRITIN: FERRITIN: 345 ng/mL — AB (ref 10.0–291.0)

## 2016-03-06 ENCOUNTER — Encounter: Payer: Self-pay | Admitting: *Deleted

## 2016-03-10 ENCOUNTER — Encounter (HOSPITAL_COMMUNITY)
Admission: RE | Admit: 2016-03-10 | Discharge: 2016-03-10 | Disposition: A | Discharge status: Home / Self Care | Payer: Medicare Other | Source: Ambulatory Visit | Attending: Gastroenterology | Admitting: Gastroenterology

## 2016-03-10 ENCOUNTER — Encounter (HOSPITAL_COMMUNITY): Payer: Self-pay

## 2016-03-10 VITALS — BP 123/48 | HR 81 | Temp 97.8°F | Resp 14

## 2016-03-10 DIAGNOSIS — D62 Acute posthemorrhagic anemia: Secondary | ICD-10-CM | POA: Diagnosis present

## 2016-03-10 DIAGNOSIS — D509 Iron deficiency anemia, unspecified: Secondary | ICD-10-CM | POA: Insufficient documentation

## 2016-03-10 MED ORDER — SODIUM CHLORIDE 0.9 % IV SOLN
Freq: Every day | INTRAVENOUS | Status: AC
  Administered 2016-03-10: 14:00:00 via INTRAVENOUS

## 2016-03-10 MED ORDER — SODIUM CHLORIDE 0.9 % IV SOLN
510.0000 mg | Freq: Once | INTRAVENOUS | Status: AC
  Administered 2016-03-10: 510 mg via INTRAVENOUS
  Filled 2016-03-10: qty 17

## 2016-03-10 NOTE — Telephone Encounter (Signed)
I left a message for the patient to call back

## 2016-03-18 ENCOUNTER — Other Ambulatory Visit (INDEPENDENT_AMBULATORY_CARE_PROVIDER_SITE_OTHER): Payer: Medicare Other

## 2016-03-18 ENCOUNTER — Ambulatory Visit (INDEPENDENT_AMBULATORY_CARE_PROVIDER_SITE_OTHER): Payer: Medicare Other | Admitting: Internal Medicine

## 2016-03-18 ENCOUNTER — Encounter: Payer: Self-pay | Admitting: Internal Medicine

## 2016-03-18 VITALS — BP 132/80 | HR 82 | Temp 98.6°F | Resp 14 | Ht 64.5 in | Wt 128.0 lb

## 2016-03-18 DIAGNOSIS — I272 Other secondary pulmonary hypertension: Secondary | ICD-10-CM

## 2016-03-18 DIAGNOSIS — N183 Chronic kidney disease, stage 3 unspecified: Secondary | ICD-10-CM

## 2016-03-18 DIAGNOSIS — I10 Essential (primary) hypertension: Secondary | ICD-10-CM | POA: Diagnosis not present

## 2016-03-18 LAB — CBC
HEMATOCRIT: 31.2 % — AB (ref 36.0–46.0)
HEMOGLOBIN: 10.3 g/dL — AB (ref 12.0–15.0)
MCHC: 32.9 g/dL (ref 30.0–36.0)
MCV: 101.2 fl — AB (ref 78.0–100.0)
PLATELETS: 195 10*3/uL (ref 150.0–400.0)
RBC: 3.09 Mil/uL — ABNORMAL LOW (ref 3.87–5.11)
RDW: 15.1 % (ref 11.5–15.5)
WBC: 7.3 10*3/uL (ref 4.0–10.5)

## 2016-03-18 LAB — COMPREHENSIVE METABOLIC PANEL
ALT: 12 U/L (ref 0–35)
AST: 20 U/L (ref 0–37)
Albumin: 4.1 g/dL (ref 3.5–5.2)
Alkaline Phosphatase: 42 U/L (ref 39–117)
BUN: 29 mg/dL — AB (ref 6–23)
CHLORIDE: 107 meq/L (ref 96–112)
CO2: 24 meq/L (ref 19–32)
Calcium: 9 mg/dL (ref 8.4–10.5)
Creatinine, Ser: 1.17 mg/dL (ref 0.40–1.20)
GFR: 47.6 mL/min — ABNORMAL LOW (ref >60.00)
GLUCOSE: 90 mg/dL (ref 70–99)
POTASSIUM: 4.2 meq/L (ref 3.5–5.1)
SODIUM: 138 meq/L (ref 135–145)
TOTAL PROTEIN: 6.3 g/dL (ref 6.0–8.3)
Total Bilirubin: 0.4 mg/dL (ref 0.2–1.2)

## 2016-03-18 MED ORDER — FLUTICASONE PROPIONATE 50 MCG/ACT NA SUSP: NASAL | Status: DC

## 2016-03-18 NOTE — Assessment & Plan Note (Signed)
GFR about 45 for the last several months and she still needs intense monitoring due to high risk medication usage.

## 2016-03-18 NOTE — Assessment & Plan Note (Signed)
BP at goal with lasix, diltiazem, losartan. Checking CMP and adjust as needed.

## 2016-03-18 NOTE — Patient Instructions (Signed)
We will check the labs today and send the results on mychart.   Keep up the good work and call us with any problems. Come back in about 5-6 months for the physical.

## 2016-03-18 NOTE — Progress Notes (Signed)
Subjective:    Patient ID: Paula Ward, female    DOB: December 20, 1935, 80 y.o.   MRN: [id number]  HPI The patient is a 80 YO female coming in for follow up of her blood pressure and kidney function. She does take a lot of medicines that can affect her kidney function. She is taking diltiazem and lasix and losartan and denies side effects with the medicines. Kidney function has been stable and no change in her urination. Getting iron infusions which have helped with her energy levels.   Review of Systems  Constitutional: Negative for activity change, appetite change and unexpected weight change.  Respiratory: Positive for shortness of breath. Negative for cough, chest tightness and wheezing.        Back to baseline  Cardiovascular: Negative for chest pain, palpitations and leg swelling.  Gastrointestinal: Negative for abdominal pain, diarrhea, constipation, blood in stool, abdominal distention and anal bleeding.       No black stools  Musculoskeletal: Positive for arthralgias. Negative for gait problem.  Skin: Negative.   Neurological: Negative for dizziness, light-headedness and headaches.  Hematological: Negative.       Objective:   Physical Exam  Constitutional: She is oriented to person, place, and time. She appears well-developed and well-nourished. No distress.  HENT:  Head: Normocephalic and atraumatic.  Eyes: EOM are normal.  Neck: Normal range of motion.  Cardiovascular: Normal rate and regular rhythm.   Murmur heard. Pulmonary/Chest: Effort normal and breath sounds normal. No respiratory distress. She has no rales.  Abdominal: Soft. Bowel sounds are normal. She exhibits no distension. There is no tenderness. There is no rebound.  Neurological: She is alert and oriented to person, place, and time. Coordination normal.  Skin: Skin is warm and dry.   Filed Vitals:   03/18/16 1348  BP: 132/80  Pulse: 82  Temp: 98.6 F (37 C)  TempSrc: Oral  Resp: 14  Height: 5' 4.5"  (1.638 m)  Weight: 128 lb (58.06 kg)  SpO2: 95%      Assessment & Plan:

## 2016-03-18 NOTE — Progress Notes (Signed)
Pre visit review using our clinic review tool, if applicable. No additional management support is needed unless otherwise documented below in the visit note.

## 2016-03-31 ENCOUNTER — Encounter: Payer: Self-pay | Admitting: Gastroenterology

## 2016-04-07 ENCOUNTER — Ambulatory Visit (INDEPENDENT_AMBULATORY_CARE_PROVIDER_SITE_OTHER): Payer: Medicare Other | Admitting: Gastroenterology

## 2016-04-07 ENCOUNTER — Encounter: Payer: Self-pay | Admitting: Gastroenterology

## 2016-04-07 ENCOUNTER — Encounter (HOSPITAL_COMMUNITY): Payer: Self-pay

## 2016-04-07 ENCOUNTER — Encounter (HOSPITAL_COMMUNITY)
Admission: RE | Admit: 2016-04-07 | Discharge: 2016-04-07 | Disposition: A | Discharge status: Home / Self Care | Payer: Medicare Other | Source: Ambulatory Visit | Attending: Gastroenterology | Admitting: Gastroenterology

## 2016-04-07 VITALS — BP 102/42 | HR 80 | Temp 98.7°F | Resp 18 | Ht 64.5 in | Wt 121.0 lb

## 2016-04-07 VITALS — BP 110/50 | HR 80 | Ht 64.0 in | Wt 123.5 lb

## 2016-04-07 DIAGNOSIS — D62 Acute posthemorrhagic anemia: Secondary | ICD-10-CM | POA: Insufficient documentation

## 2016-04-07 DIAGNOSIS — Q2733 Arteriovenous malformation of digestive system vessel: Secondary | ICD-10-CM

## 2016-04-07 DIAGNOSIS — D649 Anemia, unspecified: Secondary | ICD-10-CM | POA: Diagnosis not present

## 2016-04-07 DIAGNOSIS — K5520 Angiodysplasia of colon without hemorrhage: Secondary | ICD-10-CM

## 2016-04-07 MED ORDER — SODIUM CHLORIDE 0.9 % IV SOLN
Freq: Every day | INTRAVENOUS | Status: DC
  Administered 2016-04-07: 09:00:00 via INTRAVENOUS

## 2016-04-07 MED ORDER — SODIUM CHLORIDE 0.9 % IV SOLN
510.0000 mg | Freq: Once | INTRAVENOUS | Status: AC
  Administered 2016-04-07: 510 mg via INTRAVENOUS
  Filled 2016-04-07: qty 17

## 2016-04-07 NOTE — Discharge Instructions (Signed)
Ferumoxytol injection What is this medicine? FERUMOXYTOL is an iron complex. Iron is used to make healthy red blood cells, which carry oxygen and nutrients throughout the body. This medicine is used to treat iron deficiency anemia in people with chronic kidney disease. This medicine may be used for other purposes; ask your health care provider or pharmacist if you have questions. What should I tell my health care provider before I take this medicine? They need to know if you have any of these conditions: -anemia not caused by low iron levels -high levels of iron in the blood -magnetic resonance imaging (MRI) test scheduled -an unusual or allergic reaction to iron, other medicines, foods, dyes, or preservatives -pregnant or trying to get pregnant -breast-feeding How should I use this medicine? This medicine is for injection into a vein. It is given by a health care professional in a hospital or clinic setting. Talk to your pediatrician regarding the use of this medicine in children. Special care may be needed. Overdosage: If you think you have taken too much of this medicine contact a poison control center or emergency room at once. NOTE: This medicine is only for you. Do not share this medicine with others. What if I miss a dose? It is important not to miss your dose. Call your doctor or health care professional if you are unable to keep an appointment. What may interact with this medicine? This medicine may interact with the following medications: -other iron products This list may not describe all possible interactions. Give your health care provider a list of all the medicines, herbs, non-prescription drugs, or dietary supplements you use. Also tell them if you smoke, drink alcohol, or use illegal drugs. Some items may interact with your medicine. What should I watch for while using this medicine? Visit your doctor or healthcare professional regularly. Tell your doctor or healthcare  professional if your symptoms do not start to get better or if they get worse. You may need blood work done while you are taking this medicine. You may need to follow a special diet. Talk to your doctor. Foods that contain iron include: whole grains/cereals, dried fruits, beans, or peas, leafy green vegetables, and organ meats (liver, kidney). What side effects may I notice from receiving this medicine? Side effects that you should report to your doctor or health care professional as soon as possible: -allergic reactions like skin rash, itching or hives, swelling of the face, lips, or tongue -breathing problems -changes in blood pressure -feeling faint or lightheaded, falls -fever or chills -flushing, sweating, or hot feelings -swelling of the ankles or feet Side effects that usually do not require medical attention (Report these to your doctor or health care professional if they continue or are bothersome.): -diarrhea -headache -nausea, vomiting -stomach pain This list may not describe all possible side effects. Call your doctor for medical advice about side effects. You may report side effects to FDA at 1-800-FDA-1088. Where should I keep my medicine? This drug is given in a hospital or clinic and will not be stored at home. NOTE: This sheet is a summary. It may not cover all possible information. If you have questions about this medicine, talk to your doctor, pharmacist, or health care provider.    2016, Elsevier/Gold Standard. (2012-05-21 15:23:36)

## 2016-04-07 NOTE — Patient Instructions (Signed)
Please come to our lab on Monday 04/14/2016 to have your follow up labs drawn Your follow up appointment is scheduled on 06/10/2016 at 2pm

## 2016-04-08 NOTE — Progress Notes (Signed)
Paula Ward    [id number]    September 21, 1936  Primary Care Physician:Elizabeth Becky Augusta, MD  Referring Physician: Hoyt Koch, MD [address] [place], [place] [address]  Chief complaint:  Iron deficiency anemia  HPI:  80 year old female with history of scleroderma, pulmonary hypertension, small intestinal angioectasia with chronic GI blood loss and iron deficiency anemia here for follow-up visit. Small bowel enteroscopy showed AVM in duodenum and proximal jejunum treated with APC in March 2017.  Feeling better and she is no longer having melena.  Denies any nausea, vomiting, abdominal pain, or bright red blood per rectum. She feels her energy level is better since she is getting iron infusions but is more tired on the day of iron infusion.   Outpatient Encounter Prescriptions as of 04/07/2016  Medication Sig  . acetaminophen (TYLENOL) 325 MG tablet Take 650 mg by mouth every 6 (six) hours as needed for mild pain.   Marland Kitchen albuterol (PROVENTIL HFA;VENTOLIN HFA) 108 (90 BASE) MCG/ACT inhaler Inhale 2 puffs into the lungs every 6 (six) hours as needed for wheezing or shortness of breath.  . ALPRAZolam (XANAX) 0.25 MG tablet Take 0.5 tablets (0.125 mg total) by mouth at bedtime as needed. (Patient taking differently: Take 0.125 mg by mouth at bedtime as needed for sleep. )  . Cyanocobalamin (VITAMIN B-12 PO) Take 1 tablet by mouth daily.   Marland Kitchen diltiazem (CARDIZEM CD) 120 MG 24 hr capsule Take 120 mg by mouth daily.    . fluticasone (FLONASE) 50 MCG/ACT nasal spray instill 1 spray into each nostril twice a day if needed  . furosemide (LASIX) 20 MG tablet Take 20 mg by mouth daily as needed for fluid.   . IRON PO Take 1 tablet by mouth daily. Pt uses Vitamin Code brand  . loratadine (CLARITIN) 10 MG tablet Take 10 mg by mouth daily as needed for allergies.   Marland Kitchen losartan (COZAAR) 25 MG tablet Take 25 mg by mouth daily as needed (Pt is instructed by doctor to take based on  kidney lab work).   Marland Kitchen omeprazole (PRILOSEC) 40 MG capsule Take 1 capsule (40 mg total) by mouth 2 (two) times daily. (Patient taking differently: Take 40 mg by mouth daily. )  . OPSUMIT 10 MG TABS Take 1 tablet by mouth daily.   . Pyridoxine HCl (VITAMIN B6 PO) Take 1 tablet by mouth daily.   Marland Kitchen triamcinolone cream (KENALOG) 0.1 % Apply 1 application topically daily as needed (Irritated skin).   . [DISCONTINUED] chlorhexidine (PERIDEX) 0.12 % solution Reported on 03/18/2016  . [DISCONTINUED] 0.9 %  sodium chloride infusion    No facility-administered encounter medications on file as of 04/07/2016.    Allergies as of 04/07/2016 - Review Complete 04/07/2016  Allergen Reaction Noted  . Codeine Nausea Only 08/03/2008  . Lisinopril Other (See Comments) 12/14/2014  . Other Nausea And Vomiting 04/05/2012    Past Medical History  Diagnosis Date  . Anemia   . Pulmonary embolus ([place]) 2003  . CREST syndrome ([place])   . GERD (gastroesophageal reflux disease)     w/ HH  . Angiodysplasia of stomach and duodenum   . Arthus phenomenon   . Pulmonary hypertension ([place])     followed by Dr Gaynell Face at [facility] now Dr. Maryjean Ka visit end 2'17.  . Interstitial lung disease ([place])   . Scleroderma ([place])   . Rectal incontinence   . AVM (arteriovenous malformation)   .  Nodule of kidney   . Uterine polyp   . Chronic kidney disease     Chronic mild renal insuffiency  . Breast cancer ([place]) 1989    Left  . Renal cell carcinoma ([place])   . Candida esophagitis ([place])   . Cataract   . Tubular adenoma of colon   . Blood transfusion without reported diagnosis     last 4 units 12-22-15, Iron infusion x2 last -01-07-16,01-14-16.  Marland Kitchen PONV (postoperative nausea and vomiting)     Past Surgical History  Procedure Laterality Date  . Tonsillectomy    . Appendectomy  1962  . Breast lumpectomy  1989    left  . Ivc filter    . Kidney surgery      left -"laser surgery by Dr. Kathlene Cote- 5 yrs ago" no removal  . Cataract  extraction, bilateral Bilateral 12/2013  . Esophagogastroduodenoscopy (egd) with propofol N/A 12/21/2015    Procedure: ESOPHAGOGASTRODUODENOSCOPY (EGD) WITH PROPOFOL;  Surgeon: Irene Shipper, MD;  Location: [facility];  Service: Endoscopy;  Laterality: N/A;  . Tubal ligation    . Enteroscopy N/A 01/18/2016    Procedure: ENTEROSCOPY;  Surgeon: Mauri Pole, MD;  Location: [facility];  Service: Endoscopy;  Laterality: N/A;    Family History  Problem Relation Age of Onset  . Bladder Cancer Father   . Diabetes Father   . Prostate cancer Father   . Alzheimer's disease Mother   . Colon cancer Neg Hx   . Lung cancer Sister   . Diabetes Sister   . Lung cancer Sister      smoker  . Esophageal cancer Paternal Uncle     Social History   Social History  . Marital Status: Married    Spouse Name: N/A  . Number of Children: 2  . Years of Education: N/A   Occupational History  . Retired    Social History Main Topics  . Smoking status: Never Smoker   . Smokeless tobacco: Never Used  . Alcohol Use: No  . Drug Use: No  . Sexual Activity: No   Other Topics Concern  . Not on file   Social History Narrative   Married '61   1 son- '65, 1 daughter '63; 6 children (2 adopted)   SO- SOB   Retirement- doing well   Marriage in good health   Patient has never smoked   Alcohol use- no   Pt gets regular exercise            Review of systems: Review of Systems  Constitutional: Negative for fever and chills.  HENT: Negative.   Eyes: Negative for blurred vision.  Respiratory: Negative for cough, shortness of breath and wheezing.   Cardiovascular: Negative for chest pain and palpitations.  Gastrointestinal: as per HPI Genitourinary: Negative for dysuria, urgency, frequency and hematuria.  Musculoskeletal: Negative for myalgias, back pain and joint pain.  Skin: Negative for itching and rash.  Neurological: Negative for dizziness, tremors, focal weakness, seizures and loss of  consciousness.  Endo/Heme/Allergies: Negative for environmental allergies.  Psychiatric/Behavioral: Negative for depression, suicidal ideas and hallucinations.  All other systems reviewed and are negative.   Physical Exam: Filed Vitals:   04/07/16 1445 04/07/16 1519  BP: 80/42 110/50  Pulse: 80    Gen:      No acute distress HEENT:  EOMI, sclera anicteric Neck:     No masses; no thyromegaly Lungs:    Clear to auscultation bilaterally; normal respiratory effort CV:  Regular rate and rhythm; no murmurs Abd:      + bowel sounds; soft, non-tender; no palpable masses, no distension Ext:    No edema; adequate peripheral perfusion Skin:      Warm and dry; no rash Neuro: alert and oriented x 3 Psych: normal mood and affect  Data Reviewed: Reviewed chart in epic   Assessment and Plan/Recommendations: 89 yr F with h/o scleroderma , pulmonary hypertension and Gastrointestinal AVM with chronic iron deficiency anemia here for follow up visit No further overt GI bleed. Hgb is stable at 10-11 with monthly Feraheme infusion F/u CBC and ferritin level Return in 3 months   K. Denzil Magnuson , MD [phone] Mon-Fri 8a-5p [phone] after 5p, weekends, holidays  CC: Hoyt Koch, *

## 2016-04-14 ENCOUNTER — Other Ambulatory Visit (INDEPENDENT_AMBULATORY_CARE_PROVIDER_SITE_OTHER): Payer: Medicare Other

## 2016-04-14 DIAGNOSIS — D649 Anemia, unspecified: Secondary | ICD-10-CM

## 2016-04-14 DIAGNOSIS — I272 Other secondary pulmonary hypertension: Secondary | ICD-10-CM

## 2016-04-14 LAB — COMPREHENSIVE METABOLIC PANEL
ALBUMIN: 4.1 g/dL (ref 3.5–5.2)
ALK PHOS: 42 U/L (ref 39–117)
ALT: 12 U/L (ref 0–35)
AST: 21 U/L (ref 0–37)
BUN: 35 mg/dL — AB (ref 6–23)
CO2: 27 mEq/L (ref 19–32)
CREATININE: 1.4 mg/dL — AB (ref 0.40–1.20)
Calcium: 9.4 mg/dL (ref 8.4–10.5)
Chloride: 104 mEq/L (ref 96–112)
GFR: 38.69 mL/min — ABNORMAL LOW (ref >60.00)
Glucose, Bld: 89 mg/dL (ref 70–99)
Potassium: 4.8 mEq/L (ref 3.5–5.1)
SODIUM: 137 meq/L (ref 135–145)
TOTAL PROTEIN: 6.6 g/dL (ref 6.0–8.3)
Total Bilirubin: 0.5 mg/dL (ref 0.2–1.2)

## 2016-04-14 LAB — CBC
HCT: 33.1 % — ABNORMAL LOW (ref 36.0–46.0)
Hemoglobin: 11.1 g/dL — ABNORMAL LOW (ref 12.0–15.0)
MCHC: 33.7 g/dL (ref 30.0–36.0)
MCV: 100.4 fl — ABNORMAL HIGH (ref 78.0–100.0)
Platelets: 226 10*3/uL (ref 150.0–400.0)
RBC: 3.29 Mil/uL — ABNORMAL LOW (ref 3.87–5.11)
RDW: 13.9 % (ref 11.5–15.5)
WBC: 7.3 10*3/uL (ref 4.0–10.5)

## 2016-04-14 LAB — CBC WITH DIFFERENTIAL/PLATELET
BASOS PCT: 0.5 % (ref 0.0–3.0)
Basophils Absolute: 0 10*3/uL (ref 0.0–0.1)
EOS PCT: 1.4 % (ref 0.0–5.0)
Eosinophils Absolute: 0.1 10*3/uL (ref 0.0–0.7)
HEMATOCRIT: 33.4 % — AB (ref 36.0–46.0)
HEMOGLOBIN: 11.2 g/dL — AB (ref 12.0–15.0)
Lymphocytes Relative: 20.3 % (ref 12.0–46.0)
Lymphs Abs: 1.5 10*3/uL (ref 0.7–4.0)
MCHC: 33.4 g/dL (ref 30.0–36.0)
MCV: 100.9 fl — ABNORMAL HIGH (ref 78.0–100.0)
MONO ABS: 0.5 10*3/uL (ref 0.1–1.0)
Monocytes Relative: 6.4 % (ref 3.0–12.0)
NEUTROS ABS: 5.3 10*3/uL (ref 1.4–7.7)
Neutrophils Relative %: 71.4 % (ref 43.0–77.0)
PLATELETS: 231 10*3/uL (ref 150.0–400.0)
RBC: 3.31 Mil/uL — ABNORMAL LOW (ref 3.87–5.11)
RDW: 14.3 % (ref 11.5–15.5)
WBC: 7.4 10*3/uL (ref 4.0–10.5)

## 2016-04-14 LAB — FERRITIN: FERRITIN: 643.4 ng/mL — AB (ref 10.0–291.0)

## 2016-04-14 LAB — IBC PANEL
Iron: 118 ug/dL (ref 42–145)
Saturation Ratios: 56.2 % — ABNORMAL HIGH (ref 20.0–50.0)
Transferrin: 150 mg/dL — ABNORMAL LOW (ref 212.0–360.0)

## 2016-04-16 ENCOUNTER — Encounter: Payer: Self-pay | Admitting: Gastroenterology

## 2016-05-03 ENCOUNTER — Telehealth: Payer: Self-pay

## 2016-05-03 NOTE — Telephone Encounter (Signed)
Patient is on the list for [employer] 2017 and may be a good candidate for an AWV in 2017. Please let me know if/when appt is scheduled.

## 2016-05-05 ENCOUNTER — Other Ambulatory Visit (INDEPENDENT_AMBULATORY_CARE_PROVIDER_SITE_OTHER): Payer: Medicare Other

## 2016-05-05 DIAGNOSIS — D509 Iron deficiency anemia, unspecified: Secondary | ICD-10-CM | POA: Diagnosis not present

## 2016-05-05 NOTE — Telephone Encounter (Signed)
Call to Paula Ward; undergoing multiple tx and seeing multiple doctors. Would like to be called to fup in October and give her time to feel better as well as complete her current iron infusions and cardiac studies  To call in October

## 2016-05-06 ENCOUNTER — Encounter: Payer: Self-pay | Admitting: Gastroenterology

## 2016-05-06 LAB — CBC WITH DIFFERENTIAL/PLATELET
BASOS ABS: 0.1 10*3/uL (ref 0.0–0.1)
Basophils Relative: 1.1 % (ref 0.0–3.0)
Eosinophils Absolute: 0.1 10*3/uL (ref 0.0–0.7)
Eosinophils Relative: 1.8 % (ref 0.0–5.0)
HEMATOCRIT: 32.2 % — AB (ref 36.0–46.0)
HEMOGLOBIN: 10.9 g/dL — AB (ref 12.0–15.0)
LYMPHS PCT: 19.9 % (ref 12.0–46.0)
Lymphs Abs: 1.2 10*3/uL (ref 0.7–4.0)
MCHC: 33.9 g/dL (ref 30.0–36.0)
MCV: 102 fl — AB (ref 78.0–100.0)
MONOS PCT: 1.4 % — AB (ref 3.0–12.0)
Monocytes Absolute: 0.1 10*3/uL (ref 0.1–1.0)
NEUTROS PCT: 75.8 % (ref 43.0–77.0)
Neutro Abs: 4.5 10*3/uL (ref 1.4–7.7)
PLATELETS: 224 10*3/uL (ref 150.0–400.0)
RBC: 3.15 Mil/uL — AB (ref 3.87–5.11)
RDW: 14.2 % (ref 11.5–15.5)
WBC: 5.9 10*3/uL (ref 4.0–10.5)

## 2016-05-07 ENCOUNTER — Other Ambulatory Visit: Payer: Self-pay

## 2016-05-07 DIAGNOSIS — D62 Acute posthemorrhagic anemia: Secondary | ICD-10-CM

## 2016-05-07 DIAGNOSIS — D509 Iron deficiency anemia, unspecified: Secondary | ICD-10-CM

## 2016-05-08 ENCOUNTER — Other Ambulatory Visit (HOSPITAL_COMMUNITY): Payer: Self-pay | Admitting: Interventional Radiology

## 2016-05-08 DIAGNOSIS — C642 Malignant neoplasm of left kidney, except renal pelvis: Secondary | ICD-10-CM

## 2016-05-09 ENCOUNTER — Telehealth: Payer: Self-pay | Admitting: *Deleted

## 2016-05-09 ENCOUNTER — Ambulatory Visit (HOSPITAL_COMMUNITY): Payer: Medicare Other | Attending: Cardiovascular Disease

## 2016-05-09 ENCOUNTER — Other Ambulatory Visit: Payer: Self-pay

## 2016-05-09 DIAGNOSIS — I059 Rheumatic mitral valve disease, unspecified: Secondary | ICD-10-CM | POA: Insufficient documentation

## 2016-05-09 DIAGNOSIS — I319 Disease of pericardium, unspecified: Secondary | ICD-10-CM

## 2016-05-09 DIAGNOSIS — I270 Primary pulmonary hypertension: Secondary | ICD-10-CM | POA: Insufficient documentation

## 2016-05-09 DIAGNOSIS — I3139 Other pericardial effusion (noninflammatory): Secondary | ICD-10-CM

## 2016-05-09 DIAGNOSIS — J849 Interstitial pulmonary disease, unspecified: Secondary | ICD-10-CM | POA: Diagnosis not present

## 2016-05-09 DIAGNOSIS — I350 Nonrheumatic aortic (valve) stenosis: Secondary | ICD-10-CM | POA: Diagnosis not present

## 2016-05-09 DIAGNOSIS — Z86711 Personal history of pulmonary embolism: Secondary | ICD-10-CM | POA: Diagnosis not present

## 2016-05-09 DIAGNOSIS — M341 CR(E)ST syndrome: Secondary | ICD-10-CM | POA: Diagnosis not present

## 2016-05-09 DIAGNOSIS — N189 Chronic kidney disease, unspecified: Secondary | ICD-10-CM | POA: Insufficient documentation

## 2016-05-09 DIAGNOSIS — Z853 Personal history of malignant neoplasm of breast: Secondary | ICD-10-CM | POA: Diagnosis not present

## 2016-05-09 DIAGNOSIS — I313 Pericardial effusion (noninflammatory): Secondary | ICD-10-CM | POA: Diagnosis not present

## 2016-05-09 NOTE — Progress Notes (Signed)
2D Echo Complete.  Findings show large Pericardial Effusion (as seen on prior exams).  Results reviewed with Dr. Stanford Breed.

## 2016-05-09 NOTE — Telephone Encounter (Signed)
Spoke with pt, per dr Stanford Breed, her pericardial effusion has returned and is significant. He would like the pt to be seen Monday. Pt reports she can not come to the office Monday, she is getting an iron infusion. She does not want to do anything until we talk to Dr Huey Romans or crystal webber his assistant. Dr Stanford Breed aware, will try to get the pt in with someone next week.

## 2016-05-12 ENCOUNTER — Encounter (HOSPITAL_COMMUNITY): Payer: Self-pay

## 2016-05-12 ENCOUNTER — Encounter (HOSPITAL_COMMUNITY)
Admission: RE | Admit: 2016-05-12 | Discharge: 2016-05-12 | Disposition: A | Discharge status: Home / Self Care | Payer: Medicare Other | Source: Ambulatory Visit | Attending: Gastroenterology | Admitting: Gastroenterology

## 2016-05-12 DIAGNOSIS — D62 Acute posthemorrhagic anemia: Secondary | ICD-10-CM | POA: Diagnosis present

## 2016-05-12 DIAGNOSIS — D509 Iron deficiency anemia, unspecified: Secondary | ICD-10-CM | POA: Insufficient documentation

## 2016-05-12 MED ORDER — FERUMOXYTOL INJECTION 510 MG/17 ML
510.0000 mg | Freq: Once | INTRAVENOUS | Status: AC
  Administered 2016-05-12: 510 mg via INTRAVENOUS
  Filled 2016-05-12: qty 17

## 2016-05-12 MED ORDER — SODIUM CHLORIDE 0.9 % IV SOLN
INTRAVENOUS | Status: DC
  Administered 2016-05-12: 12:00:00 via INTRAVENOUS

## 2016-05-12 NOTE — Discharge Instructions (Signed)
Feraheme Ferumoxytol injection What is this medicine? FERUMOXYTOL is an iron complex. Iron is used to make healthy red blood cells, which carry oxygen and nutrients throughout the body. This medicine is used to treat iron deficiency anemia in people with chronic kidney disease. This medicine may be used for other purposes; ask your health care provider or pharmacist if you have questions. What should I tell my health care provider before I take this medicine? They need to know if you have any of these conditions: -anemia not caused by low iron levels -high levels of iron in the blood -magnetic resonance imaging (MRI) test scheduled -an unusual or allergic reaction to iron, other medicines, foods, dyes, or preservatives -pregnant or trying to get pregnant -breast-feeding How should I use this medicine? This medicine is for injection into a vein. It is given by a health care professional in a hospital or clinic setting. Talk to your pediatrician regarding the use of this medicine in children. Special care may be needed. Overdosage: If you think you have taken too much of this medicine contact a poison control center or emergency room at once. NOTE: This medicine is only for you. Do not share this medicine with others. What if I miss a dose? It is important not to miss your dose. Call your doctor or health care professional if you are unable to keep an appointment. What may interact with this medicine? This medicine may interact with the following medications: -other iron products This list may not describe all possible interactions. Give your health care provider a list of all the medicines, herbs, non-prescription drugs, or dietary supplements you use. Also tell them if you smoke, drink alcohol, or use illegal drugs. Some items may interact with your medicine. What should I watch for while using this medicine? Visit your doctor or healthcare professional regularly. Tell your doctor or  healthcare professional if your symptoms do not start to get better or if they get worse. You may need blood work done while you are taking this medicine. You may need to follow a special diet. Talk to your doctor. Foods that contain iron include: whole grains/cereals, dried fruits, beans, or peas, leafy green vegetables, and organ meats (liver, kidney). What side effects may I notice from receiving this medicine? Side effects that you should report to your doctor or health care professional as soon as possible: -allergic reactions like skin rash, itching or hives, swelling of the face, lips, or tongue -breathing problems -changes in blood pressure -feeling faint or lightheaded, falls -fever or chills -flushing, sweating, or hot feelings -swelling of the ankles or feet Side effects that usually do not require medical attention (Report these to your doctor or health care professional if they continue or are bothersome.): -diarrhea -headache -nausea, vomiting -stomach pain This list may not describe all possible side effects. Call your doctor for medical advice about side effects. You may report side effects to FDA at 1-800-FDA-1088. Where should I keep my medicine? This drug is given in a hospital or clinic and will not be stored at home. NOTE: This sheet is a summary. It may not cover all possible information. If you have questions about this medicine, talk to your doctor, pharmacist, or health care provider.    2016, Elsevier/Gold Standard. (2012-05-21 15:23:36)

## 2016-05-12 NOTE — Telephone Encounter (Signed)
Spoke with amy at dr Florina Ou office. She requested I fax the results to her @ [phone]. Echo results faxed to the number provided.

## 2016-05-13 ENCOUNTER — Encounter: Payer: Self-pay | Admitting: Cardiology

## 2016-05-13 NOTE — Telephone Encounter (Signed)
New message   [employer] returning call back to nurse Hilda Blades.

## 2016-05-14 ENCOUNTER — Other Ambulatory Visit (INDEPENDENT_AMBULATORY_CARE_PROVIDER_SITE_OTHER): Payer: Medicare Other

## 2016-05-14 DIAGNOSIS — I272 Other secondary pulmonary hypertension: Secondary | ICD-10-CM

## 2016-05-14 LAB — CBC
HEMATOCRIT: 33.4 % — AB (ref 36.0–46.0)
Hemoglobin: 11.3 g/dL — ABNORMAL LOW (ref 12.0–15.0)
MCHC: 33.9 g/dL (ref 30.0–36.0)
MCV: 101.1 fl — ABNORMAL HIGH (ref 78.0–100.0)
Platelets: 229 10*3/uL (ref 150.0–400.0)
RBC: 3.31 Mil/uL — AB (ref 3.87–5.11)
RDW: 13.9 % (ref 11.5–15.5)
WBC: 7.2 10*3/uL (ref 4.0–10.5)

## 2016-05-14 LAB — COMPREHENSIVE METABOLIC PANEL
ALBUMIN: 4.4 g/dL (ref 3.5–5.2)
ALT: 11 U/L (ref 0–35)
AST: 20 U/L (ref 0–37)
Alkaline Phosphatase: 46 U/L (ref 39–117)
BUN: 27 mg/dL — ABNORMAL HIGH (ref 6–23)
CALCIUM: 9.8 mg/dL (ref 8.4–10.5)
CHLORIDE: 103 meq/L (ref 96–112)
CO2: 28 mEq/L (ref 19–32)
CREATININE: 1.17 mg/dL (ref 0.40–1.20)
GFR: 47.58 mL/min — AB (ref >60.00)
Glucose, Bld: 92 mg/dL (ref 70–99)
Potassium: 4.5 mEq/L (ref 3.5–5.1)
Sodium: 139 mEq/L (ref 135–145)
Total Bilirubin: 0.6 mg/dL (ref 0.2–1.2)
Total Protein: 7.2 g/dL (ref 6.0–8.3)

## 2016-05-14 NOTE — Telephone Encounter (Signed)
Dr Florina Ou office called back, they have talked to the pt and because she is not having any problems they do not feel anything needs to be done at this time. Will make dr Stanford Breed aware.

## 2016-05-14 NOTE — Telephone Encounter (Signed)
Elective fuov Kirk Ruths

## 2016-05-15 NOTE — Telephone Encounter (Signed)
  This encounter was created in error - please disregard.

## 2016-05-19 NOTE — Telephone Encounter (Signed)
Spoke with pt, Follow up scheduled

## 2016-05-20 ENCOUNTER — Encounter: Payer: Self-pay | Admitting: Gastroenterology

## 2016-05-20 ENCOUNTER — Other Ambulatory Visit: Payer: Self-pay

## 2016-05-20 DIAGNOSIS — A09 Infectious gastroenteritis and colitis, unspecified: Secondary | ICD-10-CM

## 2016-05-20 DIAGNOSIS — D62 Acute posthemorrhagic anemia: Secondary | ICD-10-CM

## 2016-05-23 ENCOUNTER — Encounter: Payer: Self-pay | Admitting: Internal Medicine

## 2016-05-26 ENCOUNTER — Encounter: Payer: Self-pay | Admitting: Gastroenterology

## 2016-06-02 ENCOUNTER — Other Ambulatory Visit (INDEPENDENT_AMBULATORY_CARE_PROVIDER_SITE_OTHER): Payer: Medicare Other

## 2016-06-02 DIAGNOSIS — D62 Acute posthemorrhagic anemia: Secondary | ICD-10-CM

## 2016-06-02 DIAGNOSIS — A09 Infectious gastroenteritis and colitis, unspecified: Secondary | ICD-10-CM

## 2016-06-02 LAB — CBC WITH DIFFERENTIAL/PLATELET
BASOS ABS: 0 10*3/uL (ref 0.0–0.1)
Basophils Relative: 0.5 % (ref 0.0–3.0)
Eosinophils Absolute: 0.1 10*3/uL (ref 0.0–0.7)
Eosinophils Relative: 1.2 % (ref 0.0–5.0)
HEMATOCRIT: 30.6 % — AB (ref 36.0–46.0)
HEMOGLOBIN: 10.3 g/dL — AB (ref 12.0–15.0)
LYMPHS PCT: 20.4 % (ref 12.0–46.0)
Lymphs Abs: 1.2 10*3/uL (ref 0.7–4.0)
MCHC: 33.6 g/dL (ref 30.0–36.0)
MCV: 102.1 fl — AB (ref 78.0–100.0)
MONOS PCT: 6.6 % (ref 3.0–12.0)
Monocytes Absolute: 0.4 10*3/uL (ref 0.1–1.0)
Neutro Abs: 4.3 10*3/uL (ref 1.4–7.7)
Neutrophils Relative %: 71.3 % (ref 43.0–77.0)
Platelets: 189 10*3/uL (ref 150.0–400.0)
RBC: 3 Mil/uL — ABNORMAL LOW (ref 3.87–5.11)
RDW: 13.4 % (ref 11.5–15.5)
WBC: 6.1 10*3/uL (ref 4.0–10.5)

## 2016-06-04 ENCOUNTER — Telehealth: Payer: Self-pay | Admitting: *Deleted

## 2016-06-04 MED ORDER — OMEPRAZOLE 40 MG PO CPDR: 40.0000 mg | DELAYED_RELEASE_CAPSULE | Freq: Two times a day (BID) | ORAL | 5 refills | Status: DC

## 2016-06-04 NOTE — Telephone Encounter (Signed)
prescription sent into pharmacy electronically per refill request from [facility]

## 2016-06-05 ENCOUNTER — Ambulatory Visit (HOSPITAL_COMMUNITY)
Admission: RE | Admit: 2016-06-05 | Discharge: 2016-06-05 | Disposition: A | Discharge status: Home / Self Care | Payer: Medicare Other | Source: Ambulatory Visit | Attending: Interventional Radiology | Admitting: Interventional Radiology

## 2016-06-05 ENCOUNTER — Ambulatory Visit
Admission: RE | Admit: 2016-06-05 | Discharge: 2016-06-05 | Disposition: A | Discharge status: Home / Self Care | Payer: Medicare Other | Source: Ambulatory Visit | Attending: Interventional Radiology | Admitting: Interventional Radiology

## 2016-06-05 DIAGNOSIS — C642 Malignant neoplasm of left kidney, except renal pelvis: Secondary | ICD-10-CM | POA: Diagnosis not present

## 2016-06-05 DIAGNOSIS — I517 Cardiomegaly: Secondary | ICD-10-CM | POA: Diagnosis not present

## 2016-06-05 DIAGNOSIS — J90 Pleural effusion, not elsewhere classified: Secondary | ICD-10-CM | POA: Diagnosis not present

## 2016-06-05 DIAGNOSIS — N281 Cyst of kidney, acquired: Secondary | ICD-10-CM | POA: Insufficient documentation

## 2016-06-05 DIAGNOSIS — I313 Pericardial effusion (noninflammatory): Secondary | ICD-10-CM | POA: Diagnosis not present

## 2016-06-05 HISTORY — PX: IR GENERIC HISTORICAL: IMG1180011

## 2016-06-05 MED ORDER — GADOBENATE DIMEGLUMINE 529 MG/ML IV SOLN
10.0000 mL | Freq: Once | INTRAVENOUS | Status: AC | PRN
  Administered 2016-06-05: 6 mL via INTRAVENOUS

## 2016-06-05 NOTE — Progress Notes (Signed)
Patient ID: Paula Ward, female   DOB: 07-15-1936, 80 y.o.   MRN: [id number]   Referring Physician(s): Dr. Adella Hare in 2012 Dr. Pricilla Holm, PCP  Chief Complaint: The patient is seen in follow up today s/p cryoablation of left renal clear cell carcinoma, 5 years ago  History of present illness: Paula Ward is a 80 y.o. female status post percutaneous cryoablation of a left clear cell renal carcinoma on 04/11/2011.  She is doing well today.  Her kidney function is well managed by her PCP due to several high risk medications she takes.  Otherwise, she has no complaints.  She is here today for her 5 year follow up appointment.   Past Medical History:  Diagnosis Date  . Anemia   . Angiodysplasia of stomach and duodenum   . Arthus phenomenon   . AVM (arteriovenous malformation)   . Blood transfusion without reported diagnosis    last 4 units 12-22-15, Iron infusion x2 last -01-07-16,01-14-16.  . Breast cancer ([place]) 1989   Left  . Candida esophagitis ([place])   . Cataract   . Chronic kidney disease    Chronic mild renal insuffiency  . CREST syndrome ([place])   . GERD (gastroesophageal reflux disease)    w/ HH  . Interstitial lung disease ([place])   . Nodule of kidney   . PONV (postoperative nausea and vomiting)   . Pulmonary embolus ([place]) 2003  . Pulmonary hypertension ([place])    followed by Dr Gaynell Face at [facility], now Dr. Maryjean Ka visit end 2'17.  . Rectal incontinence   . Renal cell carcinoma ([place])   . Scleroderma ([place])   . Tubular adenoma of colon   . Uterine polyp     Past Surgical History:  Procedure Laterality Date  . APPENDECTOMY  1962  . BREAST LUMPECTOMY  1989   left  . CATARACT EXTRACTION, BILATERAL Bilateral 12/2013  . ENTEROSCOPY N/A 01/18/2016   Procedure: ENTEROSCOPY;  Surgeon: Mauri Pole, MD;  [facility];  Service: Endoscopy;  Laterality: N/A;  . ESOPHAGOGASTRODUODENOSCOPY (EGD) WITH PROPOFOL N/A 12/21/2015   Procedure:  ESOPHAGOGASTRODUODENOSCOPY (EGD) WITH PROPOFOL;  Surgeon: Irene Shipper, MD;  [facility];  Service: Endoscopy;  Laterality: N/A;  . IVC Filter    . KIDNEY SURGERY     left -"laser surgery by Dr. Kathlene Cote- 5 yrs ago" no removal  . TONSILLECTOMY    . TUBAL LIGATION      Allergies: Codeine; Other; and Lisinopril  Medications: Prior to Admission medications   Medication Sig Start Date End Date Taking? Authorizing Provider  acetaminophen (TYLENOL) 325 MG tablet Take 650 mg by mouth every 6 (six) hours as needed for mild pain.    Yes Historical Provider, MD  albuterol (PROVENTIL HFA;VENTOLIN HFA) 108 (90 BASE) MCG/ACT inhaler Inhale 2 puffs into the lungs every 6 (six) hours as needed for wheezing or shortness of breath. 08/31/15  Yes Hoyt Koch, MD  ALPRAZolam Duanne Moron) 0.25 MG tablet Take 0.5 tablets (0.125 mg total) by mouth at bedtime as needed. Patient taking differently: Take 0.125 mg by mouth at bedtime as needed for sleep.  02/23/15  Yes Hoyt Koch, MD  Cyanocobalamin (VITAMIN B-12 PO) Take 1 tablet by mouth daily.    Yes Historical Provider, MD  diltiazem (CARDIZEM CD) 120 MG 24 hr capsule Take 120 mg by mouth daily.     Yes Historical Provider, MD  fluticasone (FLONASE) 50 MCG/ACT nasal spray instill 1 spray into each nostril twice  a day if needed 03/18/16  Yes Hoyt Koch, MD  furosemide (LASIX) 20 MG tablet Take 20 mg by mouth daily as needed for fluid.  06/28/15 06/27/16 Yes Historical Provider, MD  IRON PO Take 1 tablet by mouth daily. Pt uses Vitamin Code brand   Yes Historical Provider, MD  loratadine (CLARITIN) 10 MG tablet Take 10 mg by mouth daily as needed for allergies.    Yes Historical Provider, MD  losartan (COZAAR) 25 MG tablet Take 25 mg by mouth daily as needed (Pt is instructed by doctor to take based on kidney lab work).    Yes Historical Provider, MD  omeprazole (PRILOSEC) 40 MG capsule Take 1 capsule (40 mg total) by mouth 2 (two) times  daily. 06/04/16  Yes Mauri Pole, MD  OPSUMIT 10 MG TABS Take 1 tablet by mouth daily.  08/16/15  Yes Historical Provider, MD  Pyridoxine HCl (VITAMIN B6 PO) Take 1 tablet by mouth daily.    Yes Historical Provider, MD  triamcinolone cream (KENALOG) 0.1 % Apply 1 application topically daily as needed (Irritated skin).  11/30/15  Yes Historical Provider, MD     Family History  Problem Relation Age of Onset  . Bladder Cancer Father   . Diabetes Father   . Prostate cancer Father   . Alzheimer's disease Mother   . Lung cancer Sister   . Diabetes Sister   . Lung cancer Sister      smoker  . Esophageal cancer Paternal Uncle   . Colon cancer Neg Hx     Social History   Social History  . Marital status: Married    Spouse name: N/A  . Number of children: 2  . Years of education: N/A   Occupational History  . Retired    Social History Main Topics  . Smoking status: Never Smoker  . Smokeless tobacco: Never Used  . Alcohol use No  . Drug use: No  . Sexual activity: No   Other Topics Concern  . None   Social History Narrative   Married '61   1 son- '65, 1 daughter '63; 6 children (2 adopted)   SO- SOB   Retirement- doing well   Marriage in good health   Patient has never smoked   Alcohol use- no   Pt gets regular exercise           Vital Signs: BP (!) 117/52 (BP Location: Right Arm, Patient Position: Sitting, Cuff Size: Normal)   Pulse 93   Temp 97.8 F (36.6 C)   Resp 14   Ht _0  (1.651 m)   Wt 125 lb (56.7 kg)   SpO2 95%   BMI 20.80 kg/m   Physical Exam Gen: Pleasant, NAD Heart: regular Lungs: CTAB Abd: soft, NT, ND, +BS  Imaging: Mr Abdomen W Wo Contrast  Result Date: 06/05/2016 CLINICAL DATA:  Cryoablation of left renal cell carcinoma in June of 2012. Renal insufficiency. EXAM: MRI ABDOMEN WITHOUT AND WITH CONTRAST TECHNIQUE: Multiplanar multisequence MR imaging of the abdomen was performed both before and after the administration of  intravenous contrast. CONTRAST:  6 cc of MultiHance. COMPARISON:  06/06/2015 FINDINGS: Lower chest: new or increased left-sided pleural effusion. Cardiomegaly with increase in moderate to large pericardial effusion. Incompletely imaged fluid collection in the left breast may be postoperative. Example image 32/series 9. This is present back to 2014. Hepatobiliary: Iron deposition within. No focal liver lesion. Hepatomegaly, 19.1 cm craniocaudal. Normal gallbladder, without biliary ductal dilatation. Pancreas: Normal,  without mass or ductal dilatation. Spleen: Iron deposition within. Adrenals/Urinary Tract: Iron deposition within the adrenal glands. Mild renal cortical thinning bilaterally. Bilateral precontrast T1 hyperintense renal lesions. An upper pole left-sided lesion measures 10 mm today versus 9 mm previously. An interpolar right-sided 10 mm lesion is new. These lesions demonstrate no post-contrast enhancement, including on subtracted images, and are consistent with complex cysts. There is also an upper pole left renal simple cyst. Ablation site about the posterior medial aspect of the upper/ interpolar left kidney is again identified, including on image 21/ series 11001. There is no evidence of recurrent mass or abnormal enhancement. No hydronephrosis. Stomach/Bowel: Gastric underdistention. Normal caliber of abdominal bowel loops. Large amount of colonic stool. Vascular/Lymphatic: Aortic and branch vessel atherosclerosis. IVC filter in place. Patent left renal vein. No retroperitoneal or retrocrural adenopathy. Other: No ascites. Musculoskeletal: Iron deposition within. IMPRESSION: 1. Similar appearance of left upper pole renal ablation site, without locally recurrent or metastatic disease. 2. Left renal simple cysts with bilateral complex renal cysts. 3. Iron deposition within the liver, spleen, adrenal glands, and marrow space. Most consistent with hemosiderosis. 4. Cardiomegaly with increasing moderate to  large pericardial effusion. A left-sided pleural effusion is new or increased. Electronically Signed   By: Abigail Miyamoto M.D.   On: 06/05/2016 11:24    Labs:  CBC:  Recent Labs  04/14/16 1111 05/05/16 1103 05/14/16 1145 06/02/16 1114  WBC 7.3 5.9 7.2 6.1  HGB 11.1* 10.9* 11.3* 10.3*  HCT 33.1* 32.2* 33.4* 30.6*  PLT 226.0 224.0 229.0 189.0    COAGS: No results for input(s): INR, APTT in the last 8760 hours.  BMP:  Recent Labs  12/21/15 0756 12/22/15 0346 12/23/15 0338 12/24/15 1926  02/13/16 1415 03/18/16 1434 04/14/16 1111 05/14/16 1145  NA 143 141 143 140  < > 139 138 137 139  K 4.5 4.7 4.6 4.3  < > 4.7 4.2 4.8 4.5  CL 110 111 112* 110  < > 105 107 104 103  CO2 _0 < > _1 GLUCOSE 95 103* 98 120*  < > 115* 90 89 92  BUN 63* 44* 55* 55*  < > 29* 29* 35* 27*  CALCIUM 8.6* 8.5* 8.9 8.8*  < > 9.5 9.0 9.4 9.8  CREATININE 1.28* 1.23* 1.30* 1.62*  < > 1.27* 1.17 1.40* 1.17  GFRNONAA 39* 41* 39* 30*  --   --   --   --   --   GFRAA 46* 48* 45* 34*  --   --   --   --   --   < > = values in this interval not displayed.  LIVER FUNCTION TESTS:  Recent Labs  02/13/16 1415 03/18/16 1434 04/14/16 1111 05/14/16 1145  BILITOT 0.4 0.4 0.5 0.6  AST _2 ALT _3 ALKPHOS 44 42 42 46  PROT 7.0 6.3 6.6 7.2  ALBUMIN 4.3 4.1 4.1 4.4    Assessment: LANGLEY FLATLEY is a 80 y.o. female status post percutaneous cryoablation of a left clear cell renal carcinoma on 04/11/2011.  The patient is doing very well.  Her MRI shows no evidence of new or recurrent malignancy in her left kidney.  It does show an enlarging pericardial effusion.  However, she just had an ECHO in July of 2017 and it revealed this finding as well.  Her cardiologist appears to be aware of this finding.  No further follow up  is needed at this time.  She may follow up on an as need basis.  Signed: Henreitta Cea 06/05/2016, 2:04 PM   Please refer to Dr. Margaretmary Dys  attestation of this note for management and plan.

## 2016-06-06 ENCOUNTER — Other Ambulatory Visit: Payer: Self-pay

## 2016-06-06 DIAGNOSIS — D509 Iron deficiency anemia, unspecified: Secondary | ICD-10-CM

## 2016-06-09 ENCOUNTER — Telehealth: Payer: Self-pay | Admitting: *Deleted

## 2016-06-09 NOTE — Addendum Note (Signed)
Addended by: Tyrone Sage on: 06/09/2016 03:25 PM   Modules accepted: Orders

## 2016-06-09 NOTE — Telephone Encounter (Signed)
Spoke with pt, she has reviewed the results through my chart. She denies SOB or other symptoms. She reports an occ cough but nothing that occurs frequently. She denies orthopnea or chest pain. She has a follow up appointment with dr Stanford Breed 06-19-16. She is not interested in seeing anyone this week. She just wants to see dr Stanford Breed. Patient voiced understanding of the improtance of that appointment and she is aware to call if she develops any symptoms at all. Will watch the schedule for any cancellations in schedule before the 31st. Will make dr Stanford Breed aware.

## 2016-06-09 NOTE — Telephone Encounter (Signed)
-----  Message from Lelon Perla, MD sent at 06/09/2016  7:09 AM EDT ----- I am concerned about progression of pericardial and pleural effusion; I think she needs pericardiocentesis for diagnostic and therapeutic purposes; please see if she could see Maylon Cos this week as add on to arrange. Kirk Ruths  ----- Message ----- From: Sherren Mocha, MD Sent: 06/08/2016   9:48 PM To: Cristopher Estimable, RN, Lelon Perla, MD  Increasing pleural and pericardial effusion. Sent to me but pt of [place]. Will forward.

## 2016-06-10 ENCOUNTER — Encounter (HOSPITAL_COMMUNITY): Payer: Self-pay

## 2016-06-10 ENCOUNTER — Ambulatory Visit: Payer: Medicare Other | Admitting: Gastroenterology

## 2016-06-10 ENCOUNTER — Encounter (HOSPITAL_COMMUNITY)
Admission: RE | Admit: 2016-06-10 | Discharge: 2016-06-10 | Disposition: A | Discharge status: Home / Self Care | Payer: Medicare Other | Source: Ambulatory Visit | Attending: Gastroenterology | Admitting: Gastroenterology

## 2016-06-10 ENCOUNTER — Telehealth: Payer: Self-pay | Admitting: Gastroenterology

## 2016-06-10 DIAGNOSIS — D509 Iron deficiency anemia, unspecified: Secondary | ICD-10-CM | POA: Insufficient documentation

## 2016-06-10 DIAGNOSIS — D62 Acute posthemorrhagic anemia: Secondary | ICD-10-CM | POA: Diagnosis present

## 2016-06-10 MED ORDER — SODIUM CHLORIDE 0.9 % IV SOLN
510.0000 mg | Freq: Once | INTRAVENOUS | Status: AC
  Administered 2016-06-10: 510 mg via INTRAVENOUS
  Filled 2016-06-10: qty 17

## 2016-06-10 MED ORDER — SODIUM CHLORIDE 0.9 % IV SOLN
INTRAVENOUS | Status: DC
  Administered 2016-06-10: 11:00:00 via INTRAVENOUS

## 2016-06-10 NOTE — Progress Notes (Signed)
Tolerated Feraheme infusion well. No signs of adverse reaction noted. Instructed to call Dr. Silverio Decamp for concerns once discharged from Short Stay. Patient verbalized understanding.

## 2016-06-10 NOTE — Telephone Encounter (Signed)
Patient is aware of her MRI results. She has an appointment here 06/12/16 with Dr Silverio Decamp. Rozetta Nunnery to stop any PO iron supplements.

## 2016-06-10 NOTE — Discharge Instructions (Signed)
Ferumoxytol injection What is this medicine? FERUMOXYTOL is an iron complex. Iron is used to make healthy red blood cells, which carry oxygen and nutrients throughout the body. This medicine is used to treat iron deficiency anemia in people with chronic kidney disease. This medicine may be used for other purposes; ask your health care provider or pharmacist if you have questions. What should I tell my health care provider before I take this medicine? They need to know if you have any of these conditions: -anemia not caused by low iron levels -high levels of iron in the blood -magnetic resonance imaging (MRI) test scheduled -an unusual or allergic reaction to iron, other medicines, foods, dyes, or preservatives -pregnant or trying to get pregnant -breast-feeding How should I use this medicine? This medicine is for injection into a vein. It is given by a health care professional in a hospital or clinic setting. Talk to your pediatrician regarding the use of this medicine in children. Special care may be needed. Overdosage: If you think you have taken too much of this medicine contact a poison control center or emergency room at once. NOTE: This medicine is only for you. Do not share this medicine with others. What if I miss a dose? It is important not to miss your dose. Call your doctor or health care professional if you are unable to keep an appointment. What may interact with this medicine? This medicine may interact with the following medications: -other iron products This list may not describe all possible interactions. Give your health care provider a list of all the medicines, herbs, non-prescription drugs, or dietary supplements you use. Also tell them if you smoke, drink alcohol, or use illegal drugs. Some items may interact with your medicine. What should I watch for while using this medicine? Visit your doctor or healthcare professional regularly. Tell your doctor or healthcare  professional if your symptoms do not start to get better or if they get worse. You may need blood work done while you are taking this medicine. You may need to follow a special diet. Talk to your doctor. Foods that contain iron include: whole grains/cereals, dried fruits, beans, or peas, leafy green vegetables, and organ meats (liver, kidney). What side effects may I notice from receiving this medicine? Side effects that you should report to your doctor or health care professional as soon as possible: -allergic reactions like skin rash, itching or hives, swelling of the face, lips, or tongue -breathing problems -changes in blood pressure -feeling faint or lightheaded, falls -fever or chills -flushing, sweating, or hot feelings -swelling of the ankles or feet Side effects that usually do not require medical attention (Report these to your doctor or health care professional if they continue or are bothersome.): -diarrhea -headache -nausea, vomiting -stomach pain This list may not describe all possible side effects. Call your doctor for medical advice about side effects. You may report side effects to FDA at 1-800-FDA-1088. Where should I keep my medicine? This drug is given in a hospital or clinic and will not be stored at home. NOTE: This sheet is a summary. It may not cover all possible information. If you have questions about this medicine, talk to your doctor, pharmacist, or health care provider.    2016, Elsevier/Gold Standard. (2012-05-21 15:23:36)

## 2016-06-12 ENCOUNTER — Ambulatory Visit (INDEPENDENT_AMBULATORY_CARE_PROVIDER_SITE_OTHER): Payer: Medicare Other | Admitting: Gastroenterology

## 2016-06-12 ENCOUNTER — Encounter: Payer: Self-pay | Admitting: Gastroenterology

## 2016-06-12 VITALS — BP 100/50 | HR 66 | Ht 65.0 in | Wt 121.0 lb

## 2016-06-12 DIAGNOSIS — K558 Other vascular disorders of intestine: Secondary | ICD-10-CM | POA: Diagnosis not present

## 2016-06-12 DIAGNOSIS — K5520 Angiodysplasia of colon without hemorrhage: Secondary | ICD-10-CM

## 2016-06-12 DIAGNOSIS — M349 Systemic sclerosis, unspecified: Secondary | ICD-10-CM | POA: Diagnosis not present

## 2016-06-12 NOTE — Patient Instructions (Addendum)
Your follow up appointment is on 09/15/2016 at 2:30pm

## 2016-06-12 NOTE — Progress Notes (Signed)
Paula Ward    [id number]    [birth year]  Primary Care Physician:Elizabeth Becky [place], MD  Referring Physician: Hoyt Koch, MD [id number] 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 Small bowel enteroscopy showed AVM in duodenum and proximal jejunum treated with APC in March 2017.   she was doing better since she was started on ferrous heme infusions every 4-6 weeks . Hemoglobin stable around 10-11 g/dl denies having episodes of melena, any nausea, vomiting, abdominal pain, or bright red blood per rectum. She feels her energy level is better since she is getting iron infusions but is more tired on the day of iron infusion. On recent MRI abdomen she has increased iron deposits in the liver, spleen and bone marrow consistent with hemosiderosis. She also had has large pericardial effusion which has increased in size in the past few months, no tamponade physiology with normal LVEF on echocardiogram. Overall she is feeling better and has no specific complaints   Outpatient Encounter Prescriptions as of 06/12/2016  Medication Sig  . acetaminophen (TYLENOL) 325 MG tablet Take 650 mg by mouth every 6 (six) hours as needed for mild pain.   Marland Kitchen albuterol (PROVENTIL HFA;VENTOLIN HFA) 108 (90 BASE) MCG/ACT inhaler Inhale 2 puffs into the lungs every 6 (six) hours as needed for wheezing or shortness of breath.  . ALPRAZolam (XANAX) 0.25 MG tablet Take 0.5 tablets (0.125 mg total) by mouth at bedtime as needed. (Patient taking differently: Take 0.125 mg by mouth at bedtime as needed for sleep. )  . Cyanocobalamin (VITAMIN B-12 PO) Take 1 tablet by mouth daily.   Marland Kitchen diltiazem (CARDIZEM CD) 120 MG 24 hr capsule Take 120 mg by mouth  daily.    . fluticasone (FLONASE) 50 MCG/ACT nasal spray instill 1 spray into each nostril twice a day if needed  . furosemide (LASIX) 20 MG tablet Take 20 mg by mouth daily as needed for fluid.   . IRON PO Take 1 tablet by mouth daily. Pt uses Vitamin Code brand  . loratadine (CLARITIN) 10 MG tablet Take 10 mg by mouth daily as needed for allergies.   Marland Kitchen losartan (COZAAR) 25 MG tablet Take 25 mg by mouth daily as needed (Pt is instructed by doctor to take based on kidney lab work).   Marland Kitchen omeprazole (PRILOSEC) 40 MG capsule Take 1 capsule (40 mg total) by mouth 2 (two) times daily.  . OPSUMIT 10 MG TABS Take 1 tablet by mouth daily.   . Pyridoxine HCl (VITAMIN B6 PO) Take 1 tablet by mouth daily.   Marland Kitchen triamcinolone cream (KENALOG) 0.1 % Apply 1 application topically daily as needed (Irritated skin).    No facility-administered encounter medications on file as of 06/12/2016.     Allergies as of 06/12/2016 - Review Complete 06/12/2016  Allergen Reaction Noted  . Codeine Nausea Only 08/03/2008  . Other Nausea And Vomiting 04/05/2012  . Lisinopril  12/14/2014    Past Medical History:  Diagnosis Date  . Anemia   . Angiodysplasia of stomach and duodenum   . Arthus phenomenon   . AVM (arteriovenous malformation)   . Blood transfusion without reported diagnosis    last 4 units 12-22-15, Iron infusion x2 last -01-07-16,01-14-16.  . Breast  cancer ([place]) 1989   Left  . Candida esophagitis ([place])   . Cataract   . Chronic kidney disease    Chronic mild renal insuffiency  . CREST syndrome ([place])   . GERD (gastroesophageal reflux disease)    w/ HH  . Interstitial lung disease ([place])   . Nodule of kidney   . PONV (postoperative nausea and vomiting)   . Pulmonary embolus ([place]) 2003  . Pulmonary hypertension ([place])    followed by Dr Gaynell Face at [facility], now Dr. Maryjean Ka visit end 2'17.  . Rectal incontinence   . Renal cell carcinoma ([place])   . Scleroderma ([place])   . Tubular adenoma of colon   .  Uterine polyp     Past Surgical History:  Procedure Laterality Date  . APPENDECTOMY  1962  . BREAST LUMPECTOMY  1989   left  . CATARACT EXTRACTION, BILATERAL Bilateral 12/2013  . ENTEROSCOPY N/A 01/18/2016   Procedure: ENTEROSCOPY;  Surgeon: Mauri Pole, MD;  Location: [facility];  Service: Endoscopy;  Laterality: N/A;  . ESOPHAGOGASTRODUODENOSCOPY (EGD) WITH PROPOFOL N/A 12/21/2015   Procedure: ESOPHAGOGASTRODUODENOSCOPY (EGD) WITH PROPOFOL;  Surgeon: Irene Shipper, MD;  Location: [facility];  Service: Endoscopy;  Laterality: N/A;  . IVC Filter    . KIDNEY SURGERY     left -"laser surgery by Dr. Kathlene Cote- 5 yrs ago" no removal  . TONSILLECTOMY    . TUBAL LIGATION      Family History  Problem Relation Age of Onset  . Bladder Cancer Father   . Diabetes Father   . Prostate cancer Father   . Alzheimer's disease Mother   . Lung cancer Sister   . Diabetes Sister   . Lung cancer Sister      smoker  . Esophageal cancer Paternal Uncle   . Colon cancer Neg Hx     Social History   Social History  . Marital status: Married    Spouse name: N/A  . Number of children: 2  . Years of education: N/A   Occupational History  . Retired    Social History Main Topics  . Smoking status: Never Smoker  . Smokeless tobacco: Never Used  . Alcohol use No  . Drug use: No  . Sexual activity: No   Other Topics Concern  . Not on file   Social History Narrative   Married '61   1 son- '65, 1 daughter '63; 6 children (2 adopted)   SO- SOB   Retirement- doing well   Marriage in good health   Patient has never smoked   Alcohol use- no   Pt gets regular exercise            Review of systems: Review of Systems  Constitutional: Negative for fever and chills.  HENT: Negative.   Eyes: Negative for blurred vision.  Respiratory: Negative for cough, shortness of breath and wheezing.   Cardiovascular: Negative for chest pain and palpitations.  Gastrointestinal: as per  HPI Genitourinary: Negative for dysuria, urgency, frequency and hematuria.  Musculoskeletal: Negative for myalgias, back pain and joint pain.  Skin: Negative for itching and rash.  Neurological: Negative for dizziness, tremors, focal weakness, seizures and loss of consciousness.  Endo/Heme/Allergies: Positive for seasonal allergies.  Psychiatric/Behavioral: Negative for depression, suicidal ideas and hallucinations.  All other systems reviewed and are negative.   Physical Exam: Vitals:   06/12/16 1527  BP: (!) 100/50  Pulse: 66   Body mass index is 20.14 kg/m. Gen:  No acute distress HEENT:  EOMI, sclera anicteric Neck:     No masses; no thyromegaly Lungs:    Clear to auscultation bilaterally; normal respiratory effort CV:         Regular rate and rhythm; no murmurs Abd:      + bowel sounds; soft, non-tender; no palpable masses, no distension Ext:    No edema; adequate peripheral perfusion Skin:      Warm and dry; no rash Neuro: alert and oriented x 3 Psych: normal mood and affect  Data Reviewed: MR abdomen 06/05/16 1. Similar appearance of left upper pole renal ablation site, without locally recurrent or metastatic disease. 2. Left renal simple cysts with bilateral complex renal cysts. 3. Iron deposition within the liver, spleen, adrenal glands, and marrow space. Most consistent with hemosiderosis. 4. Cardiomegaly with increasing moderate to large pericardial effusion. A left-sided pleural effusion is new or increased.   Assessment and Plan/Recommendations:  80 year old female with scleroderma, pulmonary hypertension, small intestinal angiectasia with recurrent GI bleed and was noted to have large pericardial effusion increased in size compared to prior echo in February 2017 . She was also noted to have increased iron deposition in the liver, spleen and adrenal and marrow space consistent with hemosiderosis likely iatrogenic secondary to ferrous heme infusions . We will  stop any further iron infusion and also advised patient to avoid any multivitamins or supplements with iron  Check CBC, CMP, ferritin and iron panel Q monthly  She has follow up appointment with Cardiology next week to discuss possible pericardiocentesis 25 minutes was spent face-to-face with the patient. Greater than 50% of the time used for counseling as well as treatment plan and follow-up. She had multiple questions which were answered to her satisfaction  K. Denzil Magnuson , MD [phone] Mon-Fri 8a-5p [phone] after 5p, weekends, holidays  CC: Hoyt Koch, *

## 2016-06-14 ENCOUNTER — Encounter: Payer: Self-pay | Admitting: Internal Medicine

## 2016-06-14 ENCOUNTER — Encounter: Payer: Self-pay | Admitting: Gastroenterology

## 2016-06-16 ENCOUNTER — Other Ambulatory Visit: Payer: Self-pay

## 2016-06-16 DIAGNOSIS — D6489 Other specified anemias: Secondary | ICD-10-CM

## 2016-06-17 ENCOUNTER — Telehealth: Payer: Self-pay | Admitting: Cardiology

## 2016-06-17 NOTE — Telephone Encounter (Signed)
Returned call. She wanted to know if Dr. Effie Shy had access to review the recent echo. Patient aware her physician at [facility] should have access to [place] records but if there is an issue, we can get them full copy of recent echo. Pt voiced thanks.

## 2016-06-17 NOTE — Telephone Encounter (Signed)
New message       Pt is calling inquiring about results being sent to doctor at [facility] Dr. Effie Shy. Please call.

## 2016-06-18 NOTE — Progress Notes (Addendum)
HPI: FU pericardial effusion and pulmonary hypertension. 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 Has had IVC filter placed. Chest CT 2003 showed enlargement of right cardiac chambers suggesting markedly elevated right heart pressures, small to moderate pericardial effusion, findings suggestive of chronic pulmonary embolic disease and pleural effusions. Echocardiogram October 2003 technically difficult; mildly reduced LV function; moderate RAE/RVE, reduced RV function, moderate right ventricular hypertrophy and severely elevated pulmonary pressures; moderate tricuspid regurgitation and large pericardial effusion. Cardiac catheterization in 2003 showed normal coronary arteries, ejection fraction 55%, PA pressure of 79/35, wedge pressure 16, PVR 722. Patient has been followed at [facility] for her pulmonary hypertension. She has done remarkably well on medications. Last cardiac catheterization in September 2011 showed a PA pressure of 35/11 with PVR equal to 1.7 [place] units. In May 2016 she was noted to have a moderate sized pericardial effusion. She was seen again in February and it was described as large. Otherwise left ventricular function was normal. The effusion was not felt to be hemodynamically significant and it has been followed. 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 She becomes more dyspneic with anemia. Last echocardiogram July [id number] normal LV systolic function, grade 1 diastolic dysfunction, mild aortic stenosis and a large pericardial effusion. Since last seen, the patient has dyspnea with more extreme activities but not with routine activities. It is relieved with rest. It is not associated with chest pain. There is no orthopnea, PND or pedal edema. There is no syncope or palpitations. There is no exertional chest pain.   Current Outpatient Prescriptions  Medication Sig Dispense Refill  .  acetaminophen (TYLENOL) 325 MG tablet Take 650 mg by mouth every 6 (six) hours as needed for mild pain.     Marland Kitchen albuterol (PROVENTIL HFA;VENTOLIN HFA) 108 (90 BASE) MCG/ACT inhaler Inhale 2 puffs into the lungs every 6 (six) hours as needed for wheezing or shortness of breath. 1 Inhaler 3  . ALPRAZolam (XANAX) 0.25 MG tablet Take 0.5 tablets (0.125 mg total) by mouth at bedtime as needed. (Patient taking differently: Take 0.125 mg by mouth at bedtime as needed for sleep. ) 30 tablet 3  . Cyanocobalamin (VITAMIN B-12 PO) Take 1 tablet by mouth daily.     Marland Kitchen diltiazem (CARDIZEM CD) 120 MG 24 hr capsule Take 120 mg by mouth daily.      . fluticasone (FLONASE) 50 MCG/ACT nasal spray instill 1 spray into each nostril twice a day if needed 48 g 3  . furosemide (LASIX) 20 MG tablet Take 20 mg by mouth daily as needed for fluid.     Marland Kitchen loratadine (CLARITIN) 10 MG tablet Take 10 mg by mouth daily as needed for allergies.     Marland Kitchen losartan (COZAAR) 25 MG tablet Take 25 mg by mouth daily as needed (Pt is instructed by doctor to take based on kidney lab work).     Marland Kitchen omeprazole (PRILOSEC) 40 MG capsule Take 1 capsule (40 mg total) by mouth 2 (two) times daily. 60 capsule 5  . OPSUMIT 10 MG TABS Take 1 tablet by mouth daily.     . Pyridoxine HCl (VITAMIN B6 PO) Take 1 tablet by mouth daily.     Marland Kitchen triamcinolone cream (KENALOG) 0.1 % Apply 1 application topically daily as needed (Irritated skin).      No current facility-administered medications for this visit.      Past Medical History:  Diagnosis Date  . Anemia   . Angiodysplasia of stomach and duodenum   . Arthus phenomenon   . AVM (arteriovenous malformation)   . Blood transfusion without reported diagnosis    last 4 units 12-22-15, Iron infusion x2 last -01-07-16,01-14-16.  . Breast cancer ([place]) 1989   Left  . Candida esophagitis ([place])   . Cataract   . Chronic kidney disease    Chronic mild renal insuffiency  . CREST syndrome ([place])   . GERD  (gastroesophageal reflux disease)    w/ HH  . Interstitial lung disease ([place])   . Nodule of kidney   . PONV (postoperative nausea and vomiting)   . Pulmonary embolus ([place]) 2003  . Pulmonary hypertension ([place])    followed by Dr Gaynell Face at [facility], now Dr. Maryjean Ka visit end 2'17.  . Rectal incontinence   . Renal cell carcinoma ([place])   . Scleroderma ([place])   . Tubular adenoma of colon   . Uterine polyp     Past Surgical History:  Procedure Laterality Date  . APPENDECTOMY  1962  . BREAST LUMPECTOMY  1989   left  . CATARACT EXTRACTION, BILATERAL Bilateral 12/2013  . ENTEROSCOPY N/A 01/18/2016   Procedure: ENTEROSCOPY;  Surgeon: Mauri Pole, MD;  [facility];  Service: Endoscopy;  Laterality: N/A;  . ESOPHAGOGASTRODUODENOSCOPY (EGD) WITH PROPOFOL N/A 12/21/2015   Procedure: ESOPHAGOGASTRODUODENOSCOPY (EGD) WITH PROPOFOL;  Surgeon: Irene Shipper, MD;  [facility];  Service: Endoscopy;  Laterality: N/A;  . IVC Filter    . KIDNEY SURGERY     left -"laser surgery by Dr. Kathlene Cote- 5 yrs ago" no removal  . TONSILLECTOMY    . TUBAL LIGATION      Social History   Social History  . Marital status: Married    Spouse name: N/A  . Number of children: 2  . Years of education: N/A   Occupational History  . Retired    Social History Main Topics  . Smoking status: Never Smoker  . Smokeless tobacco: Never Used  . Alcohol use No  . Drug use: No  . Sexual activity: No   Other Topics Concern  . Not on file   Social History Narrative   Married '61   1 son- '65, 1 daughter '63; 6 children (2 adopted)   SO- SOB   Retirement- doing well   Marriage in good health   Patient has never smoked   Alcohol use- no   Pt gets regular exercise          Family History  Problem Relation Age of Onset  . Bladder Cancer Father   . Diabetes Father   . Prostate cancer Father   . Alzheimer's disease Mother   . Lung cancer Sister   . Diabetes Sister   . Lung cancer  Sister      smoker  . Esophageal cancer Paternal Uncle   . Colon cancer Neg Hx     ROS: no fevers or chills, productive cough, hemoptysis, dysphasia, odynophagia, melena, hematochezia, dysuria, hematuria, rash, seizure activity, orthopnea, PND, pedal edema, claudication. Remaining systems are negative.  Physical Exam: Well-developed well-nourished in no acute distress.  Skin is warm and dry.  HEENT is normal.  Neck is supple.  Chest is clear to auscultation with normal expansion.  Cardiovascular exam is regular rate and rhythm.  Abdominal exam nontender or distended. No masses palpated. Extremities show no edema. neuro grossly intact  ECG Sinus rhythm at a rate of 80.  Normal axis. Cannot rule out anterior infarct. Low voltage.  A/P  1 Patient is followed at [facility] and symptoms have been well controlled. Continue opsumit. She will continue to be followed there as well.   2 Pericardial effusion-patient has a large pericardial effusion. She has some dyspnea on exertion. I am concerned that this will cause hemodynamic compromise in the future given its size. Etiology is also unclear. Is felt to be possibly secondary to crest syndrome. However she does have a history of breast cancer and renal cell carcinoma and need to exclude malignancy. I think she needs pericardial window. We will arrange an office visit with one of our surgeons. Patient in agreement.  3 hypertension-blood pressure controlled. Continue present medications.  4 history of AVMs-followed by gastroenterology.  Kirk Ruths, MD

## 2016-06-19 ENCOUNTER — Ambulatory Visit (INDEPENDENT_AMBULATORY_CARE_PROVIDER_SITE_OTHER): Payer: Medicare Other | Admitting: Cardiology

## 2016-06-19 ENCOUNTER — Encounter: Payer: Self-pay | Admitting: Cardiology

## 2016-06-19 VITALS — BP 126/58 | HR 80 | Ht 64.0 in | Wt 125.0 lb

## 2016-06-19 DIAGNOSIS — I272 Other secondary pulmonary hypertension: Secondary | ICD-10-CM | POA: Diagnosis not present

## 2016-06-19 DIAGNOSIS — I10 Essential (primary) hypertension: Secondary | ICD-10-CM | POA: Diagnosis not present

## 2016-06-19 DIAGNOSIS — I3139 Other pericardial effusion (noninflammatory): Secondary | ICD-10-CM

## 2016-06-19 DIAGNOSIS — E785 Hyperlipidemia, unspecified: Secondary | ICD-10-CM

## 2016-06-19 DIAGNOSIS — I319 Disease of pericardium, unspecified: Secondary | ICD-10-CM

## 2016-06-19 DIAGNOSIS — I313 Pericardial effusion (noninflammatory): Secondary | ICD-10-CM

## 2016-06-19 NOTE — Patient Instructions (Signed)
Medication Instructions:   NO CHANGE.   Follow-Up: Your physician recommends that you schedule a follow-up appointment in: [place].   If you need a refill on your cardiac medications before your next appointment, please call your pharmacy.

## 2016-06-20 ENCOUNTER — Telehealth: Payer: Self-pay | Admitting: *Deleted

## 2016-06-20 DIAGNOSIS — I313 Pericardial effusion (noninflammatory): Secondary | ICD-10-CM

## 2016-06-20 DIAGNOSIS — I3139 Other pericardial effusion (noninflammatory): Secondary | ICD-10-CM

## 2016-06-20 NOTE — Telephone Encounter (Signed)
Received message from Dr Stanford Breed to refer patient to [facility] for evaluation for pericardial window.  Called patient to give her this information and that someone would be calling to schedule an appt at their office. She verbalized understanding. Referral entered into EPIC and message sent to scheduling.

## 2016-06-24 NOTE — Telephone Encounter (Signed)
Patient has appt on 06/26/16 with Dr Lanelle Bal.

## 2016-06-26 ENCOUNTER — Encounter: Payer: Medicare Other | Admitting: Cardiothoracic Surgery

## 2016-06-30 ENCOUNTER — Other Ambulatory Visit (INDEPENDENT_AMBULATORY_CARE_PROVIDER_SITE_OTHER): Payer: Medicare Other

## 2016-06-30 DIAGNOSIS — D6489 Other specified anemias: Secondary | ICD-10-CM | POA: Diagnosis not present

## 2016-06-30 LAB — COMPREHENSIVE METABOLIC PANEL
ALK PHOS: 41 U/L (ref 39–117)
ALT: 9 U/L (ref 0–35)
AST: 18 U/L (ref 0–37)
Albumin: 4 g/dL (ref 3.5–5.2)
BILIRUBIN TOTAL: 0.5 mg/dL (ref 0.2–1.2)
BUN: 30 mg/dL — ABNORMAL HIGH (ref 6–23)
CALCIUM: 9 mg/dL (ref 8.4–10.5)
CO2: 26 mEq/L (ref 19–32)
Chloride: 105 mEq/L (ref 96–112)
Creatinine, Ser: 1.17 mg/dL (ref 0.40–1.20)
GFR: 47.57 mL/min — AB (ref >60.00)
GLUCOSE: 78 mg/dL (ref 70–99)
Potassium: 4.4 mEq/L (ref 3.5–5.1)
Sodium: 136 mEq/L (ref 135–145)
TOTAL PROTEIN: 6.5 g/dL (ref 6.0–8.3)

## 2016-06-30 LAB — IBC PANEL
Iron: 62 ug/dL (ref 42–145)
SATURATION RATIOS: 31.4 % (ref 20.0–50.0)
TRANSFERRIN: 141 mg/dL — AB (ref 212.0–360.0)

## 2016-06-30 LAB — CBC WITH DIFFERENTIAL/PLATELET
BASOS ABS: 0 10*3/uL (ref 0.0–0.1)
Basophils Relative: 0.5 % (ref 0.0–3.0)
Eosinophils Absolute: 0.1 10*3/uL (ref 0.0–0.7)
Eosinophils Relative: 1.8 % (ref 0.0–5.0)
HEMATOCRIT: 31.3 % — AB (ref 36.0–46.0)
Hemoglobin: 10.8 g/dL — ABNORMAL LOW (ref 12.0–15.0)
LYMPHS PCT: 16.6 % (ref 12.0–46.0)
Lymphs Abs: 1.1 10*3/uL (ref 0.7–4.0)
MCHC: 34.3 g/dL (ref 30.0–36.0)
MCV: 101.9 fl — AB (ref 78.0–100.0)
MONOS PCT: 5.9 % (ref 3.0–12.0)
Monocytes Absolute: 0.4 10*3/uL (ref 0.1–1.0)
Neutro Abs: 5 10*3/uL (ref 1.4–7.7)
Neutrophils Relative %: 75.2 % (ref 43.0–77.0)
Platelets: 191 10*3/uL (ref 150.0–400.0)
RBC: 3.08 Mil/uL — AB (ref 3.87–5.11)
RDW: 13.1 % (ref 11.5–15.5)
WBC: 6.7 10*3/uL (ref 4.0–10.5)

## 2016-06-30 LAB — FERRITIN: Ferritin: 926.4 ng/mL — ABNORMAL HIGH (ref 10.0–291.0)

## 2016-07-01 ENCOUNTER — Encounter: Payer: Self-pay | Admitting: Cardiothoracic Surgery

## 2016-07-01 ENCOUNTER — Institutional Professional Consult (permissible substitution) (INDEPENDENT_AMBULATORY_CARE_PROVIDER_SITE_OTHER): Payer: Medicare Other | Admitting: Cardiothoracic Surgery

## 2016-07-01 VITALS — BP 129/67 | HR 82 | Resp 18 | Ht 64.0 in | Wt 125.0 lb

## 2016-07-01 DIAGNOSIS — I3139 Other pericardial effusion (noninflammatory): Secondary | ICD-10-CM

## 2016-07-01 DIAGNOSIS — I313 Pericardial effusion (noninflammatory): Secondary | ICD-10-CM

## 2016-07-01 DIAGNOSIS — I319 Disease of pericardium, unspecified: Secondary | ICD-10-CM

## 2016-07-01 NOTE — Progress Notes (Signed)
[address]       [place] [address]             [phone]                    [name] Killbuck Medical Record #[id number] Date of Birth: 1936/01/09  Referring: Lelon Perla, MD Primary Care: Hoyt Koch, MD  Chief Complaint:   Large pericardial effusion  History of Present Illness:    Paula Ward 80 y.o. female is seen in the office  today for evaluation of pericardial effusion. She has a long medical history including pulmonary embolus, pulmonary hypertension, breast cancer treated with lumpectomy and radiation followed by tamoxifen only. She had a renal cell carcinoma ablated the left kidney 5 years ago. She notes now her functional status is better than it was years ago, she was no longer requires oxygen, remains functional without significant symptoms.  Echocardiogram in February 2017 showed a large pericardial effusion, follow-up echocardiogram in July 2017 shows its persistence without evidence of tamponade. The patient is referred for consideration pericardial drainage.  She does have a history of breast cancer 18 years ago treated with lumpectomy radiation and followed by tamoxifen for 5 years. She has a history of a small renal cell carcinoma ablated and the left kidney 5 years ago, currently there is no active diagnosis malignancy.    Current Activity/ Functional Status:  Patient is independent with mobility/ambulation, transfers, ADL's, IADL's.   Zubrod Score: At the time of surgery this patient's most appropriate activity status/level should be described as: _0     0    Normal activity, no symptoms _1     1    Restricted in physical strenuous activity but ambulatory, able to do out light work _2     2    Ambulatory and capable of self care, unable to do work activities, up and about               >50 % of waking hours                              _3     3    Only limited self care, in bed greater than 50% of waking  hours _4     4    Completely disabled, no self care, confined to bed or chair _5     5    Moribund   Past Medical History:  Diagnosis Date  . Anemia   . Angiodysplasia of stomach and duodenum   . Arthus phenomenon   . AVM (arteriovenous malformation)   . Blood transfusion without reported diagnosis    last 4 units 12-22-15, Iron infusion x2 last -01-07-16,01-14-16.  . Breast cancer ([place]) 1989   Left  . Candida esophagitis ([place])   . Cataract   . Chronic kidney disease    Chronic mild renal insuffiency  . CREST syndrome ([place])   . GERD (gastroesophageal reflux disease)    w/ HH  . Interstitial lung disease ([place])   . Nodule of kidney   . PONV (postoperative nausea and vomiting)   . Pulmonary embolus ([place]) 2003  . Pulmonary hypertension ([place])    followed by Dr Gaynell Face at [facility], now Dr. Maryjean Ka visit end 2'17.  . Rectal incontinence   . Renal cell carcinoma ([place])   . Scleroderma ([place])   . Tubular adenoma of colon   .  Uterine polyp     Past Surgical History:  Procedure Laterality Date  . APPENDECTOMY  1962  . BREAST LUMPECTOMY  1989   left  . CATARACT EXTRACTION, BILATERAL Bilateral 12/2013  . ENTEROSCOPY N/A 01/18/2016   Procedure: ENTEROSCOPY;  Surgeon: Mauri Pole, MD;  Location: [facility];  Service: Endoscopy;  Laterality: N/A;  . ESOPHAGOGASTRODUODENOSCOPY (EGD) WITH PROPOFOL N/A 12/21/2015   Procedure: ESOPHAGOGASTRODUODENOSCOPY (EGD) WITH PROPOFOL;  Surgeon: Irene Shipper, MD;  Location: [facility];  Service: Endoscopy;  Laterality: N/A;  . IVC Filter    . KIDNEY SURGERY     left -"laser surgery by Dr. Kathlene Cote- 5 yrs ago" no removal  . TONSILLECTOMY    . TUBAL LIGATION      Family History  Problem Relation Age of Onset  . Bladder Cancer Father   . Diabetes Father   . Prostate cancer Father   . Alzheimer's disease Mother   . Lung cancer Sister   . Diabetes Sister   . Lung cancer Sister      smoker  . Esophageal cancer Paternal Uncle   . Colon  cancer Neg Hx     Social History   Social History  . Marital status: Married    Spouse name: N/A  . Number of children: 2  . Years of education: N/A   Occupational History  . Retired    Social History Main Topics  . Smoking status: Never Smoker  . Smokeless tobacco: Never Used  . Alcohol use No  . Drug use: No  . Sexual activity: No   Other Topics Concern  . Not on file   Social History Narrative   Married '61   1 son- '65, 1 daughter '63; 6 children (2 adopted)   SO- SOB   Retirement- doing well   Marriage in good health   Patient has never smoked   Alcohol use- no   Pt gets regular exercise          History  Smoking Status  . Never Smoker  Smokeless Tobacco  . Never Used    History  Alcohol Use No     Allergies  Allergen Reactions  . Codeine Nausea Only    Hallucinations, too  . Other Nausea And Vomiting    "-mycin" antibiotics.   Also cause hallucinations.  . Lisinopril     Pt doesn't remember     Current Outpatient Prescriptions  Medication Sig Dispense Refill  . acetaminophen (TYLENOL) 325 MG tablet Take 650 mg by mouth every 6 (six) hours as needed for mild pain.     Marland Kitchen albuterol (PROVENTIL HFA;VENTOLIN HFA) 108 (90 BASE) MCG/ACT inhaler Inhale 2 puffs into the lungs every 6 (six) hours as needed for wheezing or shortness of breath. 1 Inhaler 3  . ALPRAZolam (XANAX) 0.25 MG tablet Take 0.5 tablets (0.125 mg total) by mouth at bedtime as needed. (Patient taking differently: Take 0.125 mg by mouth at bedtime as needed for sleep. ) 30 tablet 3  . Cyanocobalamin (VITAMIN B-12 PO) Take 1 tablet by mouth daily.     Marland Kitchen diltiazem (CARDIZEM CD) 120 MG 24 hr capsule Take 120 mg by mouth daily.      . fluticasone (FLONASE) 50 MCG/ACT nasal spray instill 1 spray into each nostril twice a day if needed 48 g 3  . loratadine (CLARITIN) 10 MG tablet Take 10 mg by mouth daily as needed for allergies.     Marland Kitchen losartan (COZAAR) 25 MG tablet Take 25  mg by mouth daily  as needed (Pt is instructed by doctor to take based on kidney lab work).     Marland Kitchen omeprazole (PRILOSEC) 40 MG capsule Take 1 capsule (40 mg total) by mouth 2 (two) times daily. 60 capsule 5  . OPSUMIT 10 MG TABS Take 1 tablet by mouth daily.     . Pyridoxine HCl (VITAMIN B6 PO) Take 1 tablet by mouth daily.     Marland Kitchen triamcinolone cream (KENALOG) 0.1 % Apply 1 application topically daily as needed (Irritated skin).      No current facility-administered medications for this visit.       Review of Systems:     Cardiac Review of Systems: Y or N  Chest Pain [ n   ]  Resting SOB [ y  ] Exertional SOB  [ y ]  Orthopnea [  n]   Pedal Edema [  n ]    Palpitations [ n ] Syncope  n ]   Presyncope [n   ]  General Review of Systems: [Y] = yes [  ]=no Constitional: recent weight change [  ];  Wt loss over the last 3 months [   ] anorexia [  ]; fatigue [  ]; nausea [  ]; night sweats [  ]; fever [  ]; or chills [  ];          Dental: poor dentition[  ]; Last Dentist visit:   Eye : blurred vision [  ]; diplopia [   ]; vision changes [  ];  Amaurosis fugax[  ]; Resp: cough [  ];  wheezing[  ];  hemoptysis[  ]; shortness of breath[  ]; paroxysmal nocturnal dyspnea[  ]; dyspnea on exertion[  ]; or orthopnea[  ];  GI:  gallstones[  ], vomiting[  ];  dysphagia[  ]; melena[  ];  hematochezia [  ]; heartburn[  ];   Hx of  Colonoscopy[  ]; GU: kidney stones [  ]; hematuria[  ];   dysuria [  ];  nocturia[  ];  history of     obstruction [  ]; urinary frequency [  ]             Skin: rash, swelling[  ];, hair loss[  ];  peripheral edema[  ];  or itching[  ]; Musculosketetal: myalgias[  ];  joint swelling[  ];  joint erythema[  ];  joint pain[  ];  back pain[  ];  Heme/Lymph: bruising[  ];  bleeding[  ];  anemia[  ];  Neuro: TIA[  ];  headaches[  ];  stroke[  ];  vertigo[  ];  seizures[  ];   paresthesias[  ];  difficulty walking[  ];  Psych:depression[  ]; anxiety[  ];  Endocrine: diabetes[  ];  thyroid dysfunction[   ];  Immunizations: Flu up to date [  ]; Pneumococcal up to date [  ];  Other:  Physical Exam: BP 129/67 (BP Location: Left Arm, Patient Position: Sitting, Cuff Size: Normal)   Pulse 82   Resp 18   Ht _0  (1.626 m)   Wt 125 lb (56.7 kg)   SpO2 95% Comment: RA  BMI 21.46 kg/m   PHYSICAL EXAMINATION: General appearance: alert and cooperative Head: Normocephalic, without obvious abnormality, atraumatic Neck: no adenopathy, no carotid bruit, no JVD, supple, symmetrical, trachea midline and thyroid not enlarged, symmetric, no tenderness/mass/nodules Lymph nodes: Cervical, supraclavicular, and axillary nodes normal. Resp: bilaterial crackles at bases  Back: negative, symmetric, no curvature. ROM normal. No CVA tenderness. Cardio: regular rate and rhythm, S1, S2 normal, no murmur, click, rub or gallop GI: soft, non-tender; bowel sounds normal; no masses,  no organomegaly Extremities: extremities normal, atraumatic, no cyanosis or edema and Homans sign is negative, no sign of DVT Neurologic: Grossly normal  Diagnostic Studies & Laboratory data:     Recent Radiology Findings:   Mr Abdomen W Wo Contrast  Result Date: 06/05/2016 CLINICAL DATA:  Cryoablation of left renal cell carcinoma in June of 2012. Renal insufficiency. EXAM: MRI ABDOMEN WITHOUT AND WITH CONTRAST TECHNIQUE: Multiplanar multisequence MR imaging of the abdomen was performed both before and after the administration of intravenous contrast. CONTRAST:  6 cc of MultiHance. COMPARISON:  06/06/2015 FINDINGS: Lower chest: new or increased left-sided pleural effusion. Cardiomegaly with increase in moderate to large pericardial effusion. Incompletely imaged fluid collection in the left breast may be postoperative. Example image 32/series 9. This is present back to 2014. Hepatobiliary: Iron deposition within. No focal liver lesion. Hepatomegaly, 19.1 cm craniocaudal. Normal gallbladder, without biliary ductal dilatation. Pancreas:  Normal, without mass or ductal dilatation. Spleen: Iron deposition within. Adrenals/Urinary Tract: Iron deposition within the adrenal glands. Mild renal cortical thinning bilaterally. Bilateral precontrast T1 hyperintense renal lesions. An upper pole left-sided lesion measures 10 mm today versus 9 mm previously. An interpolar right-sided 10 mm lesion is new. These lesions demonstrate no post-contrast enhancement, including on subtracted images, and are consistent with complex cysts. There is also an upper pole left renal simple cyst. Ablation site about the posterior medial aspect of the upper/ interpolar left kidney is again identified, including on image 21/ series 11001. There is no evidence of recurrent mass or abnormal enhancement. No hydronephrosis. Stomach/Bowel: Gastric underdistention. Normal caliber of abdominal bowel loops. Large amount of colonic stool. Vascular/Lymphatic: Aortic and branch vessel atherosclerosis. IVC filter in place. Patent left renal vein. No retroperitoneal or retrocrural adenopathy. Other: No ascites. Musculoskeletal: Iron deposition within. IMPRESSION: 1. Similar appearance of left upper pole renal ablation site, without locally recurrent or metastatic disease. 2. Left renal simple cysts with bilateral complex renal cysts. 3. Iron deposition within the liver, spleen, adrenal glands, and marrow space. Most consistent with hemosiderosis. 4. Cardiomegaly with increasing moderate to large pericardial effusion. A left-sided pleural effusion is new or increased. Electronically Signed   By: Abigail Miyamoto M.D.   On: 06/05/2016 11:24     I have independently reviewed the above radiologic studies.  [facility]                        [address], [place] [address]                            [phone]  ------------------------------------------------------------------- Transthoracic Echocardiography  Patient:    Cuma, Polyakov MR #:        [id number] Study Date: 05/09/2016 Gender:     F Age:        39 Height:     162.6 cm Weight:     56 kg BSA:        1.59 m^2 Pt. Status: Room:   [place]  ATTENDING    [facility] [place],  MD  PERFORMING   [facility], Outpatient  SONOGRAPHER  [facility], RDCS  cc:  ------------------------------------------------------------------- LV EF: 55% -   60%  ------------------------------------------------------------------- Indications:      Pericardial Effusion (I31.9).  ------------------------------------------------------------------- History:   PMH:  Large Effusion, Pulmonary Embolism (2003), IVC Filter, CREST Syndrome, Breast Cancer with Radiation, Interstitial Lung Disease, Scleroderma, Chronic Kidney Disease.  Primary pulmonary hypertension.  ------------------------------------------------------------------- Study Conclusions  - Left ventricle: The cavity size was normal. Wall thickness was   normal. Systolic function was normal. The estimated ejection   fraction was in the range of 55% to 60%. Wall motion was normal;   there were no regional wall motion abnormalities. Doppler   parameters are consistent with abnormal left ventricular   relaxation (grade 1 diastolic dysfunction). - Aortic valve: There was mild stenosis. Valve area (VTI): 1.69   cm^2. Valve area (Vmax): 1.93 cm^2. Valve area (Vmean): 1.8 cm^2. - Mitral valve: Calcified annulus. - Pulmonary arteries: PA peak pressure: 39 mm Hg (S). - Pericardium, extracardiac: A large, free-flowing pericardial   effusion was identified circumferential to the heart. There was   no evidence of hemodynamic compromise.  Impressions:  - Although findings do not suggest pericardial tamponade, the   sensitivity of echo for this diagnosis is reduced in the setting   of pulmonary hypertension.   No prior study available for comparison since  2003.  ------------------------------------------------------------------- Study data:  Comparison was made to the study of 11/26/2015.  Study status:  Routine.  Procedure:  Transthoracic echocardiography. Image quality was adequate.          Transthoracic echocardiography.  M-mode, complete 2D, spectral Doppler, and color Doppler.  Birthdate:  Patient birthdate: 12/13/1935.  Age:  Patient is 80 yr old.  Sex:  Gender: female.    BMI: 21.2 kg/m^2.  Blood pressure:     117/54  Patient status:  Outpatient.  Study date: Study date: 05/09/2016. Study time: 03:14 PM.  Location:  [facility]  -------------------------------------------------------------------  ------------------------------------------------------------------- Left ventricle:  The cavity size was normal. Wall thickness was normal. Systolic function was normal. The estimated ejection fraction was in the range of 55% to 60%. Wall motion was normal; there were no regional wall motion abnormalities. Doppler parameters are consistent with abnormal left ventricular relaxation (grade 1 diastolic dysfunction).  ------------------------------------------------------------------- Aortic valve:   Mildly thickened, mildly calcified leaflets. Doppler:   There was mild stenosis.      VTI ratio of LVOT to aortic valve: 0.52. Valve area (VTI): 1.69 cm^2. Indexed valve area (VTI): 1.06 cm^2/m^2. Peak velocity ratio of LVOT to aortic valve: 0.6. Valve area (Vmax): 1.93 cm^2. Indexed valve area (Vmax): 1.21 cm^2/m^2. Mean velocity ratio of LVOT to aortic valve: 0.56. Valve area (Vmean): 1.8 cm^2. Indexed valve area (Vmean): 1.13 cm^2/m^2.   Mean gradient (S): 10 mm Hg. Peak gradient (S): 20 mm Hg.  ------------------------------------------------------------------- Aorta:  The aorta was mildly calcified. Aortic root: The aortic root was normal in size. Ascending aorta: The ascending aorta was normal in  size.  ------------------------------------------------------------------- Mitral valve:   Calcified annulus. Leaflet separation was normal. Doppler:  Transvalvular velocity was within the normal range. There was no evidence for stenosis. There was no regurgitation.    Peak gradient (D): 2 mm Hg.  ------------------------------------------------------------------- Left atrium:  The atrium was normal in size.  ------------------------------------------------------------------- Right ventricle:  There is intermittent right ventricular collapse. The cavity size was normal. Systolic function was normal.  ------------------------------------------------------------------- Pulmonic valve:   Poorly visualized.  -------------------------------------------------------------------  Tricuspid valve:   Structurally normal valve.   Leaflet separation was normal.  Doppler:  Transvalvular velocity was within the normal range. There was mild regurgitation.  ------------------------------------------------------------------- Right atrium:  There is intermittent right atrial collapse. The atrium was normal in size.  ------------------------------------------------------------------- Pericardium:  A large, free-flowing pericardial effusion was identified circumferential to the heart. There was no evidence of hemodynamic compromise. Normal respiratory flow variation across the mitral valve and non-dilated inferior vena cava.  ------------------------------------------------------------------- Systemic veins: Inferior vena cava: The vessel was normal in size. The respirophasic diameter changes were in the normal range (= 50%), consistent with normal central venous pressure. Diameter: 18.2 mm.  ------------------------------------------------------------------- Post procedure conclusions Ascending Aorta:  - The aorta was mildly  calcified.  ------------------------------------------------------------------- Measurements   IVC                                       Value          Reference  ID                                        18.2  mm       ---------    Left ventricle                            Value          Reference  LV ID, ED, PLAX chordal           (L)     33.8  mm       43 - 52  LV ID, ES, PLAX chordal           (L)     16.9  mm       23 - 38  LV fx shortening, PLAX chordal            50    %        >=29  LV PW thickness, ED                       12.2  mm       ---------  IVS/LV PW ratio, ED                       1              <=1.3  Stroke volume, 2D                         111   ml       ---------  Stroke volume/bsa, 2D                     70    ml/m^2   ---------  LV e&', lateral                            5.15  cm/s     ---------  LV E/e&', lateral                          14.16          ---------  LV s&', lateral                            16.3  cm/s     ---------  LV e&', medial                             6.91  cm/s     ---------  LV E/e&', medial                           10.55          ---------  LV e&', average                            6.03  cm/s     ---------  LV E/e&', average                          12.09          ---------    Ventricular septum                        Value          Reference  IVS thickness, ED                         12.2  mm       ---------    LVOT                                      Value          Reference  LVOT ID, S                                20.3  mm       ---------  LVOT area                                 3.24  cm^2     ---------  LVOT peak velocity, S                     134   cm/s     ---------  LVOT mean velocity, S                     80.7  cm/s     ---------  LVOT VTI, S                               24.6  cm       ---------  LVOT peak gradient, S                     7     mm Hg    ---------  Stroke volume (SV), LVOT DP               79.6  ml        ---------  Stroke index (SV/bsa), LVOT DP  50    ml/m^2   ---------    Aortic valve                              Value          Reference  Aortic valve peak velocity, S             225   cm/s     ---------  Aortic valve mean velocity, S             145   cm/s     ---------  Aortic valve VTI, S                       47.2  cm       ---------  Aortic mean gradient, S                   10    mm Hg    ---------  Aortic peak gradient, S                   20    mm Hg    ---------  VTI ratio, LVOT/AV                        0.52           ---------  Aortic valve area, VTI                    1.69  cm^2     ---------  Aortic valve area/bsa, VTI                1.06  cm^2/m^2 ---------  Velocity ratio, peak, LVOT/AV             0.6            ---------  Aortic valve area, peak velocity          1.93  cm^2     ---------  Aortic valve area/bsa, peak               1.21  cm^2/m^2 ---------  velocity  Velocity ratio, mean, LVOT/AV             0.56           ---------  Aortic valve area, mean velocity          1.8   cm^2     ---------  Aortic valve area/bsa, mean               1.13  cm^2/m^2 ---------  velocity    Aorta                                     Value          Reference  Aortic root ID, ED                        29    mm       ---------    Left atrium                               Value          Reference  LA  ID, A-P, ES                            31    mm       ---------  LA ID/bsa, A-P                            1.95  cm/m^2   <=2.2  LA volume, S                              68    ml       ---------  LA volume/bsa, S                          42.7  ml/m^2   ---------  LA volume, ES, 1-p A4C                    52    ml       ---------  LA volume/bsa, ES, 1-p A4C                32.7  ml/m^2   ---------  LA volume, ES, 1-p A2C                    80    ml       ---------  LA volume/bsa, ES, 1-p A2C                50.3  ml/m^2   ---------    Mitral valve                               Value          Reference  Mitral E-wave peak velocity               72.9  cm/s     ---------  Mitral A-wave peak velocity               113   cm/s     ---------  Mitral deceleration time          (H)     377   ms       150 - 230  Mitral peak gradient, D                   2     mm Hg    ---------  Mitral E/A ratio, peak                    0.7            ---------    Pulmonary arteries                        Value          Reference  PA pressure, S, DP                (H)     39    mm Hg    <=30    Tricuspid valve                           Value  Reference  Tricuspid regurg peak velocity            302   cm/s     ---------  Tricuspid peak RV-RA gradient             36    mm Hg    ---------    Systemic veins                            Value          Reference  Estimated CVP                             3     mm Hg    ---------    Right ventricle                           Value          Reference  RV pressure, S, DP                (H)     39    mm Hg    <=30  RV s&', lateral, S                         16.3  cm/s     ---------  Legend: (L)  and  (H)  mark values outside specified reference range.  ------------------------------------------------------------------- Prepared and Electronically Authenticated by  Sanda Klein, MD 2017-07-21T17:11:19   Recent Lab Findings: Lab Results  Component Value Date   WBC 6.7 06/30/2016   HGB 10.8 (L) 06/30/2016   HCT 31.3 (L) 06/30/2016   PLT 191.0 06/30/2016   GLUCOSE 78 06/30/2016   CHOL 180 01/30/2014   TRIG 87.0 01/30/2014   HDL 51.90 01/30/2014   LDLCALC 111 (H) 01/30/2014   ALT 9 06/30/2016   AST 18 06/30/2016   NA 136 06/30/2016   K 4.4 06/30/2016   CL 105 06/30/2016   CREATININE 1.17 06/30/2016   BUN 30 (H) 06/30/2016   CO2 26 06/30/2016   TSH 3.83 01/30/2014   INR 1.11 04/03/2011      Assessment / Plan:   1/ large chronic pericardial effusion without evidence of tamponade - likely related to autoimmune  disease and not malignancy due to its chronic nature.  2/ history of renal cell carcinoma left - Similar appearance of left upper pole renal ablation site, without locally recurrent or metastatic disease on recent MRI   3. 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 4./ Left pleural effusion- incompletely imaged with recent MRI  I discussed with the patient in detail the diagnosis of pericardial effusion without  tamponade . I recommended to her proceeding with a subxiphoid pericardial window and possible drainage of left pleural effusion depending on extent on further evaluation. She currently is not overly symptomatic from it, but I have cautioned her not to wait until she is very symptomatic before proceeding. It is been chronic in nature and so is unlikely to deteriorate quickly.   She is considering her options, she would like to wait until early October after her grandsons wedding. Her pulmonologist at [facility] has encouraged her to continue with observation, which is not unreasonable if she is monitored closely.   She will discuss with Dr. Stanford Breed and Dr.  Sudarshan before deciding how she would like to proceed        I  spent 40 minutes counseling the patient face to face and 50% or more the  time was spent in counseling and coordination of care. The total time spent in the appointment was 60 minutes.  Grace Isaac MD      [address] [place] [address] Office [phone]   Beeper [phone]  07/01/2016 6:00 PM

## 2016-07-02 ENCOUNTER — Other Ambulatory Visit: Payer: Self-pay

## 2016-07-02 DIAGNOSIS — D508 Other iron deficiency anemias: Secondary | ICD-10-CM

## 2016-07-07 ENCOUNTER — Telehealth: Payer: Self-pay | Admitting: *Deleted

## 2016-07-07 DIAGNOSIS — I3139 Other pericardial effusion (noninflammatory): Secondary | ICD-10-CM

## 2016-07-07 DIAGNOSIS — I313 Pericardial effusion (noninflammatory): Secondary | ICD-10-CM

## 2016-07-07 NOTE — Telephone Encounter (Signed)
-----  Message from Lelon Perla, MD sent at 07/01/2016  6:30 PM EDT ----- Please schedule pa and lateral chest  Xray [facility]s  ----- Message ----- From: Grace Isaac, MD Sent: 07/01/2016   6:21 PM To: Lelon Perla, MD, Hoyt Koch, MD  Please see this recent information about our joint patient.  Paula Ward , She is not sure what she wants to do. Probably does need a follow up chest xray to evaluate increasing left pleural effusion. She will wait until after grandson's  wedding in early [place].   Lilia Argue  Gerhardt MD [facility] [address]  [place] [address]        [phone] office        [phone] beeper

## 2016-07-07 NOTE — Telephone Encounter (Signed)
Left message for pt to call

## 2016-07-08 NOTE — Telephone Encounter (Signed)
Spoke with pt, Aware of dr Jacalyn Lefevre recommendations.  cxr ordered.

## 2016-07-09 ENCOUNTER — Encounter (HOSPITAL_COMMUNITY): Admission: RE | Admit: 2016-07-09 | Payer: Medicare Other | Source: Ambulatory Visit

## 2016-07-09 ENCOUNTER — Ambulatory Visit (HOSPITAL_COMMUNITY)
Admission: RE | Admit: 2016-07-09 | Discharge: 2016-07-09 | Disposition: A | Discharge status: Home / Self Care | Payer: Medicare Other | Source: Ambulatory Visit | Attending: Cardiology | Admitting: Cardiology

## 2016-07-09 DIAGNOSIS — R918 Other nonspecific abnormal finding of lung field: Secondary | ICD-10-CM | POA: Diagnosis not present

## 2016-07-09 DIAGNOSIS — I319 Disease of pericardium, unspecified: Secondary | ICD-10-CM | POA: Insufficient documentation

## 2016-07-09 DIAGNOSIS — I313 Pericardial effusion (noninflammatory): Secondary | ICD-10-CM

## 2016-07-09 DIAGNOSIS — I517 Cardiomegaly: Secondary | ICD-10-CM | POA: Diagnosis not present

## 2016-07-09 DIAGNOSIS — I3139 Other pericardial effusion (noninflammatory): Secondary | ICD-10-CM

## 2016-07-10 ENCOUNTER — Encounter: Payer: Self-pay | Admitting: Internal Medicine

## 2016-07-10 MED ORDER — DOXYCYCLINE HYCLATE 100 MG PO TABS: 100.0000 mg | ORAL_TABLET | Freq: Two times a day (BID) | ORAL | 0 refills | Status: DC

## 2016-07-11 ENCOUNTER — Ambulatory Visit: Payer: Medicare Other | Admitting: Internal Medicine

## 2016-07-13 ENCOUNTER — Encounter: Payer: Self-pay | Admitting: Cardiology

## 2016-07-30 ENCOUNTER — Other Ambulatory Visit: Payer: Medicare Other

## 2016-08-04 ENCOUNTER — Other Ambulatory Visit (INDEPENDENT_AMBULATORY_CARE_PROVIDER_SITE_OTHER): Payer: Medicare Other

## 2016-08-04 DIAGNOSIS — D508 Other iron deficiency anemias: Secondary | ICD-10-CM | POA: Diagnosis not present

## 2016-08-04 LAB — COMPREHENSIVE METABOLIC PANEL
ALBUMIN: 4.5 g/dL (ref 3.5–5.2)
ALT: 9 U/L (ref 0–35)
AST: 19 U/L (ref 0–37)
Alkaline Phosphatase: 41 U/L (ref 39–117)
BUN: 25 mg/dL — AB (ref 6–23)
CHLORIDE: 102 meq/L (ref 96–112)
CO2: 26 meq/L (ref 19–32)
CREATININE: 1.47 mg/dL — AB (ref 0.40–1.20)
Calcium: 9.7 mg/dL (ref 8.4–10.5)
GFR: 36.54 mL/min — ABNORMAL LOW (ref >60.00)
GLUCOSE: 97 mg/dL (ref 70–99)
Potassium: 4.6 mEq/L (ref 3.5–5.1)
SODIUM: 138 meq/L (ref 135–145)
Total Bilirubin: 0.7 mg/dL (ref 0.2–1.2)
Total Protein: 7.3 g/dL (ref 6.0–8.3)

## 2016-08-04 LAB — CBC WITH DIFFERENTIAL/PLATELET
BASOS PCT: 0.3 % (ref 0.0–3.0)
Basophils Absolute: 0 10*3/uL (ref 0.0–0.1)
EOS ABS: 0.1 10*3/uL (ref 0.0–0.7)
Eosinophils Relative: 2.1 % (ref 0.0–5.0)
HCT: 33.3 % — ABNORMAL LOW (ref 36.0–46.0)
HEMOGLOBIN: 11.2 g/dL — AB (ref 12.0–15.0)
LYMPHS ABS: 1.2 10*3/uL (ref 0.7–4.0)
Lymphocytes Relative: 17.4 % (ref 12.0–46.0)
MCHC: 33.6 g/dL (ref 30.0–36.0)
MCV: 102.2 fl — ABNORMAL HIGH (ref 78.0–100.0)
MONO ABS: 0.4 10*3/uL (ref 0.1–1.0)
Monocytes Relative: 5.7 % (ref 3.0–12.0)
NEUTROS PCT: 74.5 % (ref 43.0–77.0)
Neutro Abs: 5.1 10*3/uL (ref 1.4–7.7)
PLATELETS: 185 10*3/uL (ref 150.0–400.0)
RBC: 3.25 Mil/uL — ABNORMAL LOW (ref 3.87–5.11)
RDW: 12.5 % (ref 11.5–15.5)
WBC: 6.9 10*3/uL (ref 4.0–10.5)

## 2016-08-04 LAB — IBC PANEL
Iron: 89 ug/dL (ref 42–145)
SATURATION RATIOS: 45.4 % (ref 20.0–50.0)
Transferrin: 140 mg/dL — ABNORMAL LOW (ref 212.0–360.0)

## 2016-08-04 LAB — FERRITIN: Ferritin: 922.1 ng/mL — ABNORMAL HIGH (ref 10.0–291.0)

## 2016-08-08 ENCOUNTER — Encounter (HOSPITAL_COMMUNITY): Payer: Medicare Other

## 2016-08-19 ENCOUNTER — Ambulatory Visit (INDEPENDENT_AMBULATORY_CARE_PROVIDER_SITE_OTHER)
Admission: RE | Admit: 2016-08-19 | Discharge: 2016-08-19 | Disposition: A | Discharge status: Home / Self Care | Payer: Medicare Other | Source: Ambulatory Visit | Attending: Internal Medicine | Admitting: Internal Medicine

## 2016-08-19 ENCOUNTER — Ambulatory Visit (INDEPENDENT_AMBULATORY_CARE_PROVIDER_SITE_OTHER): Payer: Medicare Other | Admitting: Internal Medicine

## 2016-08-19 ENCOUNTER — Encounter: Payer: Self-pay | Admitting: Internal Medicine

## 2016-08-19 VITALS — BP 110/58 | HR 90 | Temp 97.9°F | Wt 122.0 lb

## 2016-08-19 DIAGNOSIS — J189 Pneumonia, unspecified organism: Secondary | ICD-10-CM | POA: Insufficient documentation

## 2016-08-19 NOTE — Progress Notes (Signed)
Subjective:    Patient ID: Paula Ward, female    DOB: 29-Jan-1936, 80 y.o.   MRN: [id number]  HPI The patient is a 80 YO female coming in for follow up of pneumonia that her cardiologist diagnosed and then let us treat. She denies having cough now but was having with the pneumonia. She did complete the antibiotics as directed without side effects. No fevers or chills. No SOB. Some morning cough still which is usual for her.   Review of Systems  Constitutional: Negative.   HENT: Negative.   Eyes: Negative.   Respiratory: Positive for cough. Negative for chest tightness, shortness of breath and wheezing.   Cardiovascular: Negative.   Gastrointestinal: Negative.       Objective:   Physical Exam  Constitutional: She is oriented to person, place, and time. She appears well-developed and well-nourished.  Thin  HENT:  Head: Normocephalic and atraumatic.  Right Ear: External ear normal.  Left Ear: External ear normal.  Eyes: EOM are normal.  Neck: Normal range of motion.  Cardiovascular: Normal rate and regular rhythm.   Pulmonary/Chest: Effort normal and breath sounds normal. No respiratory distress. She has no wheezes. She has no rales.  Abdominal: Soft. She exhibits no distension. There is no tenderness.  Neurological: She is alert and oriented to person, place, and time.  Skin: Skin is warm and dry.   Vitals:   08/19/16 1429  BP: (!) 110/58  Pulse: 90  Temp: 97.9 F (36.6 C)  SpO2: 96%  Weight: 122 lb (55.3 kg)      Assessment & Plan:

## 2016-08-19 NOTE — Patient Instructions (Signed)
We are checking the chest x-ray today and will send you the results on mychart.

## 2016-08-19 NOTE — Progress Notes (Signed)
Pre visit review using our clinic review tool, if applicable. No additional management support is needed unless otherwise documented below in the visit note. 

## 2016-08-19 NOTE — Assessment & Plan Note (Signed)
Treated with doxycycline and now about 4 weeks out from treatment. Checking CXR and if persistent findings address as needed. Symptoms are improved.

## 2016-08-27 ENCOUNTER — Encounter: Payer: Self-pay | Admitting: Internal Medicine

## 2016-08-28 ENCOUNTER — Encounter: Payer: Self-pay | Admitting: Internal Medicine

## 2016-09-01 ENCOUNTER — Other Ambulatory Visit: Payer: Medicare Other

## 2016-09-08 ENCOUNTER — Ambulatory Visit: Payer: Medicare Other | Admitting: Cardiology

## 2016-09-08 ENCOUNTER — Other Ambulatory Visit (INDEPENDENT_AMBULATORY_CARE_PROVIDER_SITE_OTHER): Payer: Medicare Other

## 2016-09-08 DIAGNOSIS — D6489 Other specified anemias: Secondary | ICD-10-CM

## 2016-09-08 LAB — IBC PANEL
IRON: 111 ug/dL (ref 42–145)
SATURATION RATIOS: 51.2 % — AB (ref 20.0–50.0)
Transferrin: 155 mg/dL — ABNORMAL LOW (ref 212.0–360.0)

## 2016-09-08 LAB — COMPREHENSIVE METABOLIC PANEL
ALBUMIN: 4.4 g/dL (ref 3.5–5.2)
ALT: 10 U/L (ref 0–35)
AST: 14 U/L (ref 0–37)
Alkaline Phosphatase: 44 U/L (ref 39–117)
BUN: 35 mg/dL — ABNORMAL HIGH (ref 6–23)
CALCIUM: 9.6 mg/dL (ref 8.4–10.5)
CHLORIDE: 105 meq/L (ref 96–112)
CO2: 27 meq/L (ref 19–32)
Creatinine, Ser: 1.38 mg/dL — ABNORMAL HIGH (ref 0.40–1.20)
GFR: 39.3 mL/min — ABNORMAL LOW (ref >60.00)
Glucose, Bld: 101 mg/dL — ABNORMAL HIGH (ref 70–99)
POTASSIUM: 4.4 meq/L (ref 3.5–5.1)
SODIUM: 140 meq/L (ref 135–145)
Total Bilirubin: 0.5 mg/dL (ref 0.2–1.2)
Total Protein: 7.2 g/dL (ref 6.0–8.3)

## 2016-09-08 LAB — CBC WITH DIFFERENTIAL/PLATELET
BASOS PCT: 0.4 % (ref 0.0–3.0)
Basophils Absolute: 0 10*3/uL (ref 0.0–0.1)
EOS PCT: 1.8 % (ref 0.0–5.0)
Eosinophils Absolute: 0.1 10*3/uL (ref 0.0–0.7)
HEMATOCRIT: 34.7 % — AB (ref 36.0–46.0)
HEMOGLOBIN: 11.7 g/dL — AB (ref 12.0–15.0)
Lymphocytes Relative: 20.5 % (ref 12.0–46.0)
Lymphs Abs: 1.3 10*3/uL (ref 0.7–4.0)
MCHC: 33.8 g/dL (ref 30.0–36.0)
MCV: 101.3 fl — ABNORMAL HIGH (ref 78.0–100.0)
MONO ABS: 0.4 10*3/uL (ref 0.1–1.0)
Monocytes Relative: 5.9 % (ref 3.0–12.0)
NEUTROS ABS: 4.7 10*3/uL (ref 1.4–7.7)
Neutrophils Relative %: 71.4 % (ref 43.0–77.0)
PLATELETS: 220 10*3/uL (ref 150.0–400.0)
RBC: 3.42 Mil/uL — ABNORMAL LOW (ref 3.87–5.11)
RDW: 13 % (ref 11.5–15.5)
WBC: 6.5 10*3/uL (ref 4.0–10.5)

## 2016-09-08 LAB — FERRITIN: FERRITIN: 759.8 ng/mL — AB (ref 10.0–291.0)

## 2016-09-15 ENCOUNTER — Ambulatory Visit (INDEPENDENT_AMBULATORY_CARE_PROVIDER_SITE_OTHER): Payer: Medicare Other | Admitting: Gastroenterology

## 2016-09-15 ENCOUNTER — Encounter: Payer: Self-pay | Admitting: Gastroenterology

## 2016-09-15 VITALS — BP 108/50 | HR 88 | Ht 64.0 in | Wt 123.1 lb

## 2016-09-15 DIAGNOSIS — K558 Other vascular disorders of intestine: Secondary | ICD-10-CM | POA: Diagnosis not present

## 2016-09-15 DIAGNOSIS — D500 Iron deficiency anemia secondary to blood loss (chronic): Secondary | ICD-10-CM

## 2016-09-15 DIAGNOSIS — K5520 Angiodysplasia of colon without hemorrhage: Secondary | ICD-10-CM

## 2016-09-15 NOTE — Progress Notes (Signed)
Paula Ward    [id number]    01-Nov-1935  Primary Care Physician:Elizabeth Becky Augusta, MD  Referring Physician: Hoyt Koch, MD [address] [place], [place] [address]  Chief complaint: Anemia, Small bowel AVM  HPI:  80 year old female with history of scleroderma, pulmonary hypertension,  Large pericardial effusion, small intestinal angioectasia with chronic GI blood loss and  anemia here for follow-up visit. No specific GI complaints. Hgb stable. Ferritin is slowly trending down. Last Feraheme infusion in Aug 2017. She has large pericardial effusion , no tamponade physiology with normal LVEF on echocardiogram. 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   Outpatient Encounter Prescriptions as of 09/15/2016  Medication Sig  . acetaminophen (TYLENOL) 325 MG tablet Take 650 mg by mouth every 6 (six) hours as needed for mild pain.   Marland Kitchen albuterol (PROVENTIL HFA;VENTOLIN HFA) 108 (90 BASE) MCG/ACT inhaler Inhale 2 puffs into the lungs every 6 (six) hours as needed for wheezing or shortness of breath.  . ALPRAZolam (XANAX) 0.25 MG tablet Take 0.5 tablets (0.125 mg total) by mouth at bedtime as needed. (Patient taking differently: Take 0.125 mg by mouth at bedtime as needed for sleep. )  . Cyanocobalamin (VITAMIN B-12 PO) Take 1 tablet by mouth daily.   Marland Kitchen diltiazem (CARDIZEM CD) 120 MG 24 hr capsule Take 120 mg by mouth daily.    . fluticasone (FLONASE) 50 MCG/ACT nasal spray instill 1 spray into each nostril twice a day if needed  . loratadine (CLARITIN) 10 MG tablet Take 10 mg by mouth daily as needed for allergies.   Marland Kitchen losartan (COZAAR) 25 MG tablet Take 25 mg by mouth daily as needed (Pt is  instructed by doctor to take based on kidney lab work).   Marland Kitchen omeprazole (PRILOSEC) 40 MG capsule Take 1 capsule (40 mg total) by mouth 2 (two) times daily.  . OPSUMIT 10 MG TABS Take 1 tablet by mouth daily.   . Pyridoxine HCl (VITAMIN B6 PO) Take 1 tablet by mouth daily.   Marland Kitchen triamcinolone cream (KENALOG) 0.1 % Apply 1 application topically daily as needed (Irritated skin).    No facility-administered encounter medications on file as of 09/15/2016.     Allergies as of 09/15/2016 - Review Complete 08/19/2016  Allergen Reaction Noted  . Codeine Nausea Only 08/03/2008  . Other Nausea And Vomiting 04/05/2012  . Lisinopril  12/14/2014    Past Medical History:  Diagnosis Date  . Anemia   . Angiodysplasia of stomach and duodenum   . Arthus phenomenon   . AVM (arteriovenous malformation)   . Blood transfusion without reported diagnosis    last 4 units 12-22-15, Iron infusion x2 last -01-07-16,01-14-16.  . Breast cancer ([place]) 1989   Left  . Candida esophagitis ([place])   . Cataract   . Chronic kidney disease    Chronic mild renal insuffiency  . CREST syndrome ([place])   . GERD (gastroesophageal reflux disease)    w/ HH  . Interstitial lung disease ([place])   . Nodule of kidney   . PONV (postoperative nausea and vomiting)   . Pulmonary embolus ([place]) 2003  . Pulmonary hypertension    followed by Dr Gaynell Face  at [facility], now Dr. Maryjean Ka visit end 2'17.  . Rectal incontinence   . Renal cell carcinoma ([place])   . Scleroderma ([place])   . Tubular adenoma of colon   . Uterine polyp     Past Surgical History:  Procedure Laterality Date  . APPENDECTOMY  1962  . BREAST LUMPECTOMY  1989   left  . CATARACT EXTRACTION, BILATERAL Bilateral 12/2013  . ENTEROSCOPY N/A 01/18/2016   Procedure: ENTEROSCOPY;  Surgeon: Mauri Pole, MD;  Location: [facility];  Service: Endoscopy;  Laterality: N/A;  . ESOPHAGOGASTRODUODENOSCOPY (EGD) WITH PROPOFOL N/A 12/21/2015   Procedure: ESOPHAGOGASTRODUODENOSCOPY  (EGD) WITH PROPOFOL;  Surgeon: Irene Shipper, MD;  Location: [facility];  Service: Endoscopy;  Laterality: N/A;  . IVC Filter    . KIDNEY SURGERY     left -"laser surgery by Dr. Kathlene Cote- 5 yrs ago" no removal  . TONSILLECTOMY    . TUBAL LIGATION      Family History  Problem Relation Age of Onset  . Bladder Cancer Father   . Diabetes Father   . Prostate cancer Father   . Alzheimer's disease Mother   . Lung cancer Sister   . Diabetes Sister   . Lung cancer Sister      smoker  . Esophageal cancer Paternal Uncle   . Colon cancer Neg Hx     Social History   Social History  . Marital status: Married    Spouse name: N/A  . Number of children: 2  . Years of education: N/A   Occupational History  . Retired    Social History Main Topics  . Smoking status: Never Smoker  . Smokeless tobacco: Never Used  . Alcohol use No  . Drug use: No  . Sexual activity: No   Other Topics Concern  . Not on file   Social History Narrative   Married '61   1 son- '65, 1 daughter '63; 6 children (2 adopted)   SO- SOB   Retirement- doing well   Marriage in good health   Patient has never smoked   Alcohol use- no   Pt gets regular exercise            Review of systems: Review of Systems  Constitutional: Negative for fever and chills.  HENT: Negative.  Positive for sinus problems Eyes: Negative for blurred vision.  Respiratory: Positive for cough, shortness of breath and wheezing.   Cardiovascular: Negative for chest pain and palpitations.  Gastrointestinal: as per HPI Genitourinary: Negative for dysuria, urgency, frequency and hematuria.  Musculoskeletal: Positive for myalgias, back pain and joint pain.  Skin: Negative for itching and rash.  Neurological: Negative for dizziness, tremors, focal weakness, seizures and loss of consciousness.  Endo/Heme/Allergies: Positive for seasonal allergies.  Psychiatric/Behavioral: Negative for depression, suicidal ideas and hallucinations.    All other systems reviewed and are negative.   Physical Exam: Vitals:   09/15/16 1427  BP: (!) 108/50  Pulse: 88   Body mass index is 21.13 kg/m. Gen:      No acute distress HEENT:  EOMI, sclera anicteric Neck:     No masses; no thyromegaly Lungs:    B/l crackles; normal respiratory effort CV:         Regular rate and rhythm; no murmurs Abd:      + bowel sounds; soft, non-tender; no palpable masses, no distension Ext:    No edema; adequate peripheral perfusion Skin:      Warm and dry; no rash Neuro:  alert and oriented x 3 Psych: normal mood and affect  Data Reviewed:  Reviewed labs, radiology imaging, old records and pertinent past GI work up   Assessment and Plan/Recommendations:  80 year old female with scleroderma, pulmonary hypertension, large pericardial effusion with no tamponade physiology and small intestinal angiectasia with recurrent GI bleed and anemia is here for follow up  She was noted to have increased iron deposition in the liver, spleen and adrenal and marrow space 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 Ferritin in slowly trending down, and Hgb is stable Advised patient to avoid any multivitamins or supplements with iron  Check CBC, CMP, ferritin and iron panel Q monthly  Return as needed  25 minutes was spent face-to-face with the patient. Greater than 50% of the time used for counseling as well as treatment plan and follow-up. She had multiple questions which were answered to her satisfaction  K. Denzil Magnuson , MD [phone] Mon-Fri 8a-5p [phone] after 5p, weekends, holidays  CC: Hoyt Koch, *

## 2016-09-15 NOTE — Patient Instructions (Signed)
Follow up in 1 year  Continue your monthly lab draws

## 2016-09-17 ENCOUNTER — Ambulatory Visit (INDEPENDENT_AMBULATORY_CARE_PROVIDER_SITE_OTHER): Payer: Medicare Other | Admitting: Adult Health

## 2016-09-17 ENCOUNTER — Ambulatory Visit (INDEPENDENT_AMBULATORY_CARE_PROVIDER_SITE_OTHER)
Admission: RE | Admit: 2016-09-17 | Discharge: 2016-09-17 | Disposition: A | Discharge status: Home / Self Care | Payer: Medicare Other | Source: Ambulatory Visit | Attending: Adult Health | Admitting: Adult Health

## 2016-09-17 ENCOUNTER — Encounter: Payer: Self-pay | Admitting: Adult Health

## 2016-09-17 ENCOUNTER — Other Ambulatory Visit: Payer: Self-pay

## 2016-09-17 VITALS — BP 124/50 | Temp 98.6°F | Ht 64.0 in | Wt 123.0 lb

## 2016-09-17 DIAGNOSIS — R059 Cough, unspecified: Secondary | ICD-10-CM

## 2016-09-17 DIAGNOSIS — R05 Cough: Secondary | ICD-10-CM

## 2016-09-17 MED ORDER — DOXYCYCLINE HYCLATE 100 MG PO CAPS: 100.0000 mg | ORAL_CAPSULE | Freq: Two times a day (BID) | ORAL | 0 refills | Status: DC

## 2016-09-17 NOTE — Progress Notes (Signed)
Pre visit review using our clinic review tool, if applicable. No additional management support is needed unless otherwise documented below in the visit note.

## 2016-09-17 NOTE — Progress Notes (Signed)
Subjective:    Patient ID: Paula Ward, female    DOB: 09-Oct-1936, 80 y.o.   MRN: [id number]  HPI  80 year old female who  has a past medical history of Anemia; Angiodysplasia of stomach and duodenum; Arthus phenomenon; AVM (arteriovenous malformation); Blood transfusion without reported diagnosis; Breast cancer ([place]) (1989); Candida esophagitis ([place]); Cataract; Chronic kidney disease; CREST syndrome ([place]); GERD (gastroesophageal reflux disease); Interstitial lung disease ([place]); Nodule of kidney; PONV (postoperative nausea and vomiting); Pulmonary embolus ([place]) (2003); Pulmonary hypertension; Rectal incontinence; Renal cell carcinoma ([place]); Scleroderma ([place]); Tubular adenoma of colon; and Uterine polyp.  She presents to the clinic today for one week complaint of productive cough. She was recently diagnosed with CAP in September of 2017, repeat chest x ray showed no residual pneumonia. She feels as though her symptoms are the same as when she was diagnosed with pneumonia. She reports that the phlegm " feels stuck and when I am able to cough it up, I have to pull it out of my throat with my fingers." She feels as though her chest is congested. She does not have any fevers,chills, or worsening shortness of breath.    Review of Systems  Constitutional: Negative.   HENT: Negative.   Respiratory: Positive for cough and chest tightness. Negative for shortness of breath and wheezing.   Cardiovascular: Negative.   Gastrointestinal: Negative.   Neurological: Negative.   All other systems reviewed and are negative.  Past Medical History:  Diagnosis Date  . Anemia   . Angiodysplasia of stomach and duodenum   . Arthus phenomenon   . AVM (arteriovenous malformation)   . Blood transfusion without reported diagnosis    last 4 units 12-22-15, Iron infusion x2 last -01-07-16,01-14-16.  . Breast cancer ([place]) 1989   Left  . Candida esophagitis ([employer])   . Cataract   . Chronic kidney disease    Chronic  mild renal insuffiency  . CREST syndrome ([place])   . GERD (gastroesophageal reflux disease)    w/ HH  . Interstitial lung disease ([place])   . Nodule of kidney   . PONV (postoperative nausea and vomiting)   . Pulmonary embolus ([place]) 2003  . Pulmonary hypertension    followed by Dr Gaynell Face at [facility], now Dr. Maryjean Ka visit end 2'17.  . Rectal incontinence   . Renal cell carcinoma ([place])   . Scleroderma ([place])   . Tubular adenoma of colon   . Uterine polyp     Social History   Social History  . Marital status: Married    Spouse name: N/A  . Number of children: 2  . Years of education: N/A   Occupational History  . Retired    Social History Main Topics  . Smoking status: Never Smoker  . Smokeless tobacco: Never Used  . Alcohol use No  . Drug use: No  . Sexual activity: No   Other Topics Concern  . Not on file   Social History Narrative   Married '61   1 son- '65, 1 daughter '63; 6 children (2 adopted)   SO- SOB   Retirement- doing well   Marriage in good health   Patient has never smoked   Alcohol use- no   Pt gets regular exercise          Past Surgical History:  Procedure Laterality Date  . APPENDECTOMY  1962  . BREAST LUMPECTOMY  1989   left  . CATARACT EXTRACTION, BILATERAL Bilateral 12/2013  . ENTEROSCOPY  N/A 01/18/2016   Procedure: ENTEROSCOPY;  Surgeon: Mauri Pole, MD;  [facility];  Service: Endoscopy;  Laterality: N/A;  . ESOPHAGOGASTRODUODENOSCOPY (EGD) WITH PROPOFOL N/A 12/21/2015   Procedure: ESOPHAGOGASTRODUODENOSCOPY (EGD) WITH PROPOFOL;  Surgeon: Irene Shipper, MD;  [facility];  Service: Endoscopy;  Laterality: N/A;  . IVC Filter    . KIDNEY SURGERY     left -"laser surgery by Dr. Kathlene Cote- 5 yrs ago" no removal  . TONSILLECTOMY    . TUBAL LIGATION      Family History  Problem Relation Age of Onset  . Bladder Cancer Father   . Diabetes Father   . Prostate cancer Father   . Alzheimer's disease Mother   . Lung  cancer Sister   . Diabetes Sister   . Lung cancer Sister      smoker  . Esophageal cancer Paternal Uncle   . Colon cancer Neg Hx     Allergies  Allergen Reactions  . Codeine Nausea Only    Hallucinations, too  . Other Nausea And Vomiting    "-mycin" antibiotics.   Also cause hallucinations.  . Lisinopril     Pt doesn't remember     Current Outpatient Prescriptions on File Prior to Visit  Medication Sig Dispense Refill  . acetaminophen (TYLENOL) 325 MG tablet Take 650 mg by mouth every 6 (six) hours as needed for mild pain.     Marland Kitchen albuterol (PROVENTIL HFA;VENTOLIN HFA) 108 (90 BASE) MCG/ACT inhaler Inhale 2 puffs into the lungs every 6 (six) hours as needed for wheezing or shortness of breath. 1 Inhaler 3  . ALPRAZolam (XANAX) 0.25 MG tablet Take 0.5 tablets (0.125 mg total) by mouth at bedtime as needed. (Patient taking differently: Take 0.125 mg by mouth at bedtime as needed for sleep. ) 30 tablet 3  . Cyanocobalamin (VITAMIN B-12 PO) Take 1 tablet by mouth daily.     Marland Kitchen diltiazem (CARDIZEM CD) 120 MG 24 hr capsule Take 120 mg by mouth daily.      . fluticasone (FLONASE) 50 MCG/ACT nasal spray instill 1 spray into each nostril twice a day if needed 48 g 3  . loratadine (CLARITIN) 10 MG tablet Take 10 mg by mouth daily as needed for allergies.     Marland Kitchen losartan (COZAAR) 25 MG tablet Take 25 mg by mouth daily as needed (Pt is instructed by doctor to take based on kidney lab work).     Marland Kitchen omeprazole (PRILOSEC) 40 MG capsule Take 1 capsule (40 mg total) by mouth 2 (two) times daily. 60 capsule 5  . OPSUMIT 10 MG TABS Take 1 tablet by mouth daily.     . Pyridoxine HCl (VITAMIN B6 PO) Take 1 tablet by mouth daily.     Marland Kitchen triamcinolone cream (KENALOG) 0.1 % Apply 1 application topically daily as needed (Irritated skin).      No current facility-administered medications on file prior to visit.     BP (!) 124/50   Temp 98.6 F (37 C) (Oral)   Ht _0  (1.626 m)   Wt 123 lb (55.8 kg)   BMI  21.11 kg/m       Objective:   Physical Exam  Constitutional: She is oriented to person, place, and time. She appears well-developed and well-nourished. No distress.  HENT:  Head: Normocephalic and atraumatic.  Right Ear: External ear normal.  Left Ear: External ear normal.  Nose: Nose normal.  Mouth/Throat: Oropharynx is clear and moist. No oropharyngeal exudate.  Eyes: Conjunctivae and EOM are normal. Pupils are equal, round, and reactive to light. Left eye exhibits no discharge. No scleral icterus.  Neck: Normal range of motion. Neck supple. No thyromegaly present.  Cardiovascular: Normal rate, regular rhythm and intact distal pulses.  Exam reveals no gallop and no friction rub.   Murmur heard. Pulmonary/Chest: Effort normal and breath sounds normal. No respiratory distress. She has no wheezes. She has no rales.  Lymphadenopathy:    She has no cervical adenopathy.  Neurological: She is alert and oriented to person, place, and time.  Skin: Skin is warm and dry. No rash noted. She is not diaphoretic. No erythema. No pallor.  Psychiatric: She has a normal mood and affect. Her behavior is normal. Judgment and thought content normal.  Nursing note and vitals reviewed.      Assessment & Plan:  1. Cough - DG Chest 2 View; Future - doxycycline (VIBRAMYCIN) 100 MG capsule; Take 1 capsule (100 mg total) by mouth 2 (two) times daily.  Dispense: 14 capsule; Refill: 0 - Drink plenty of water and take Mucinex - Follow up with PCP if no improvement  Dorothyann Peng, NP

## 2016-09-18 ENCOUNTER — Telehealth: Payer: Self-pay | Admitting: Internal Medicine

## 2016-09-18 NOTE — Telephone Encounter (Signed)
Patient would like to ask Tommi Rumps a question - patient would not give me any information. She would like Cory to give a call back when possible. Thanks.

## 2016-09-18 NOTE — Telephone Encounter (Signed)
Pt calling for xray results

## 2016-09-19 ENCOUNTER — Encounter: Payer: Self-pay | Admitting: Internal Medicine

## 2016-09-22 ENCOUNTER — Encounter: Payer: Self-pay | Admitting: Adult Health

## 2016-10-16 ENCOUNTER — Other Ambulatory Visit (INDEPENDENT_AMBULATORY_CARE_PROVIDER_SITE_OTHER): Payer: Medicare Other

## 2016-10-16 DIAGNOSIS — D6489 Other specified anemias: Secondary | ICD-10-CM

## 2016-10-16 LAB — CBC WITH DIFFERENTIAL/PLATELET
BASOS PCT: 0.5 % (ref 0.0–3.0)
Basophils Absolute: 0 10*3/uL (ref 0.0–0.1)
Eosinophils Absolute: 0.1 10*3/uL (ref 0.0–0.7)
Eosinophils Relative: 1.7 % (ref 0.0–5.0)
HEMATOCRIT: 32 % — AB (ref 36.0–46.0)
Hemoglobin: 10.9 g/dL — ABNORMAL LOW (ref 12.0–15.0)
LYMPHS PCT: 19 % (ref 12.0–46.0)
Lymphs Abs: 1.2 10*3/uL (ref 0.7–4.0)
MCHC: 34.1 g/dL (ref 30.0–36.0)
MCV: 102.2 fl — AB (ref 78.0–100.0)
MONOS PCT: 4.3 % (ref 3.0–12.0)
Monocytes Absolute: 0.3 10*3/uL (ref 0.1–1.0)
NEUTROS ABS: 4.9 10*3/uL (ref 1.4–7.7)
Neutrophils Relative %: 74.5 % (ref 43.0–77.0)
PLATELETS: 196 10*3/uL (ref 150.0–400.0)
RBC: 3.13 Mil/uL — ABNORMAL LOW (ref 3.87–5.11)
RDW: 13.1 % (ref 11.5–15.5)
WBC: 6.5 10*3/uL (ref 4.0–10.5)

## 2016-10-16 LAB — COMPREHENSIVE METABOLIC PANEL
ALT: 9 U/L (ref 0–35)
AST: 19 U/L (ref 0–37)
Albumin: 4.1 g/dL (ref 3.5–5.2)
Alkaline Phosphatase: 44 U/L (ref 39–117)
BUN: 26 mg/dL — ABNORMAL HIGH (ref 6–23)
CALCIUM: 9.3 mg/dL (ref 8.4–10.5)
CHLORIDE: 106 meq/L (ref 96–112)
CO2: 30 meq/L (ref 19–32)
Creatinine, Ser: 1.22 mg/dL — ABNORMAL HIGH (ref 0.40–1.20)
GFR: 45.29 mL/min — AB (ref >60.00)
Glucose, Bld: 99 mg/dL (ref 70–99)
Potassium: 4.5 mEq/L (ref 3.5–5.1)
Sodium: 141 mEq/L (ref 135–145)
Total Bilirubin: 0.5 mg/dL (ref 0.2–1.2)
Total Protein: 6.6 g/dL (ref 6.0–8.3)

## 2016-10-16 LAB — IBC PANEL
IRON: 75 ug/dL (ref 42–145)
Saturation Ratios: 36.4 % (ref 20.0–50.0)
TRANSFERRIN: 147 mg/dL — AB (ref 212.0–360.0)

## 2016-10-16 LAB — FERRITIN: Ferritin: 630.4 ng/mL — ABNORMAL HIGH (ref 10.0–291.0)

## 2016-10-23 ENCOUNTER — Encounter: Payer: Self-pay | Admitting: Interventional Radiology

## 2016-10-28 ENCOUNTER — Encounter: Payer: Self-pay | Admitting: Internal Medicine

## 2016-10-28 ENCOUNTER — Ambulatory Visit (INDEPENDENT_AMBULATORY_CARE_PROVIDER_SITE_OTHER): Payer: Medicare Other | Admitting: Internal Medicine

## 2016-10-28 DIAGNOSIS — R05 Cough: Secondary | ICD-10-CM

## 2016-10-28 DIAGNOSIS — J209 Acute bronchitis, unspecified: Secondary | ICD-10-CM | POA: Diagnosis not present

## 2016-10-28 DIAGNOSIS — R059 Cough, unspecified: Secondary | ICD-10-CM

## 2016-10-28 MED ORDER — METHYLPREDNISOLONE ACETATE 40 MG/ML IJ SUSP: 40.0000 mg | Freq: Once | INTRAMUSCULAR | Status: DC

## 2016-10-28 MED ORDER — DOXYCYCLINE HYCLATE 100 MG PO CAPS: 100.0000 mg | ORAL_CAPSULE | Freq: Two times a day (BID) | ORAL | 0 refills | Status: DC

## 2016-10-28 MED ORDER — METHYLPREDNISOLONE ACETATE 80 MG/ML IJ SUSP
80.0000 mg | Freq: Once | INTRAMUSCULAR | Status: AC
  Administered 2016-10-28: 80 mg via INTRAMUSCULAR

## 2016-10-28 NOTE — Progress Notes (Signed)
Pre visit review using our clinic review tool, if applicable. No additional management support is needed unless otherwise documented below in the visit note.

## 2016-10-28 NOTE — Assessment & Plan Note (Signed)
Depo-medrol given at visit, rx for doxycycline for 1 week therapy given her strong history of lung disease and propensity for pneumonia and the lung changes on exam.

## 2016-10-28 NOTE — Patient Instructions (Signed)
We will give you a steroid injection today.   We have sent in doxycycline to take for 1 week. Take 1 pill twice a day.   Try some of the delsym over the counter for cough and let us know if you need something else.

## 2016-10-28 NOTE — Progress Notes (Signed)
   Subjective:    Patient ID: Paula Ward, female    DOB: 03/27/1936, 81 y.o.   MRN: [id number]  HPI The patient is a 81 YO female coming in for cough and congestion in her lungs for about 2 weeks now. Not really using her albuterol more often but getting more tired with walking and some SOB with coughing fits. This is causing her to lose sleep due to coughing at night time. Prior CAP in the last 6 months. Overall symptoms are not improving from onset. She has been using netti pot and claritin which have been keeping her nose and upper sinuses clear. No fevers or chills.   Review of Systems  Constitutional: Positive for activity change and fatigue. Negative for appetite change, chills, fever and unexpected weight change.  HENT: Positive for sore throat. Negative for congestion, ear discharge, ear pain, postnasal drip, rhinorrhea, sinus pain, sinus pressure and trouble swallowing.   Eyes: Negative.   Respiratory: Positive for cough and shortness of breath. Negative for chest tightness and wheezing.   Cardiovascular: Negative.   Gastrointestinal: Negative.   Musculoskeletal: Negative.   Neurological: Negative.       Objective:   Physical Exam  Constitutional: She is oriented to person, place, and time. She appears well-developed.  thin  HENT:  Head: Normocephalic and atraumatic.  Right Ear: External ear normal.  Left Ear: External ear normal.  Nose: Nose normal.  Oropharynx with clear drainage and redness.   Eyes: EOM are normal.  Neck: Normal range of motion.  Cardiovascular: Normal rate and regular rhythm.   Pulmonary/Chest: Effort normal. No respiratory distress. She has no wheezes.  Some rhonchi in the bases, does not clear with cough.  Abdominal: Soft.  Lymphadenopathy:    She has no cervical adenopathy.  Neurological: She is alert and oriented to person, place, and time.  Skin: Skin is warm and dry.   Vitals:   10/28/16 1101  BP: (!) 130/58  Pulse: 93  Resp: 14    Temp: 97.6 F (36.4 C)  TempSrc: Oral  SpO2: 96%  Weight: 121 lb (54.9 kg)  Height: _0  (1.626 m)      Assessment & Plan:  Solu-medrol 80 mg IM

## 2016-11-03 ENCOUNTER — Ambulatory Visit (INDEPENDENT_AMBULATORY_CARE_PROVIDER_SITE_OTHER): Payer: Medicare Other | Admitting: Internal Medicine

## 2016-11-03 ENCOUNTER — Encounter: Payer: Self-pay | Admitting: Internal Medicine

## 2016-11-03 DIAGNOSIS — J209 Acute bronchitis, unspecified: Secondary | ICD-10-CM

## 2016-11-03 DIAGNOSIS — I272 Pulmonary hypertension, unspecified: Secondary | ICD-10-CM

## 2016-11-03 NOTE — Patient Instructions (Signed)
We do not need more antibiotics today.

## 2016-11-03 NOTE — Progress Notes (Signed)
Pre visit review using our clinic review tool, if applicable. No additional management support is needed unless otherwise documented below in the visit note.

## 2016-11-03 NOTE — Progress Notes (Signed)
   Subjective:    Patient ID: Paula Ward, female    DOB: [birth year], 81 y.o.   MRN: [id number]  HPI The patient is a 81 YO female coming in for follow up of her breathing. She completed the doxycycline and is feeling somewhat improved. No SOB with activity. She is no longer using her albuterol inhaler. She denies fevers or chills. Not much sinus drainage. She is still coughing but less often. Back to her normal activities now.   Review of Systems  Constitutional: Negative for activity change, appetite change, chills, fatigue, fever and unexpected weight change.  HENT: Positive for rhinorrhea. Negative for congestion, ear discharge, ear pain, postnasal drip, sinus pain, sinus pressure and sore throat.   Eyes: Negative.   Respiratory: Positive for cough. Negative for chest tightness, shortness of breath and wheezing.   Cardiovascular: Negative.   Gastrointestinal: Negative.   Musculoskeletal: Negative.      Objective:   Physical Exam  Constitutional: She is oriented to person, place, and time. She appears well-developed and well-nourished.  HENT:  Head: Normocephalic and atraumatic.  TMs normal, oropharynx with mild erythema, nose without crusting.   Eyes: EOM are normal.  Neck: Normal range of motion. No JVD present.  Cardiovascular: Normal rate and regular rhythm.   Pulmonary/Chest: Effort normal and breath sounds normal. No respiratory distress. She has no wheezes. She has no rales.  Abdominal: Soft. She exhibits no distension. There is no tenderness. There is no rebound.  Lymphadenopathy:    She has no cervical adenopathy.  Neurological: She is alert and oriented to person, place, and time.  Skin: Skin is warm and dry.   Vitals:   11/03/16 1607  BP: (!) 110/56  Pulse: 86  Resp: 16  Temp: 97.7 F (36.5 C)  TempSrc: Oral  SpO2: 98%  Weight: 119 lb (54 kg)  Height: _0  (1.626 m)      Assessment & Plan:

## 2016-11-04 NOTE — Assessment & Plan Note (Signed)
Resolved with the doxycycline and she does not need further antibiotics. Still taking flonase most of the time.

## 2016-11-04 NOTE — Assessment & Plan Note (Signed)
Back to baseline with oxygen levels and breathing is normal on exam. Not using albuterol that often.

## 2016-11-10 ENCOUNTER — Encounter: Payer: Self-pay | Admitting: Internal Medicine

## 2016-11-17 ENCOUNTER — Other Ambulatory Visit (INDEPENDENT_AMBULATORY_CARE_PROVIDER_SITE_OTHER): Payer: Medicare Other

## 2016-11-17 DIAGNOSIS — D6489 Other specified anemias: Secondary | ICD-10-CM | POA: Diagnosis not present

## 2016-11-17 LAB — COMPREHENSIVE METABOLIC PANEL
ALT: 10 U/L (ref 0–35)
AST: 20 U/L (ref 0–37)
Albumin: 4.2 g/dL (ref 3.5–5.2)
Alkaline Phosphatase: 44 U/L (ref 39–117)
BILIRUBIN TOTAL: 0.5 mg/dL (ref 0.2–1.2)
BUN: 28 mg/dL — ABNORMAL HIGH (ref 6–23)
CO2: 26 meq/L (ref 19–32)
CREATININE: 1.24 mg/dL — AB (ref 0.40–1.20)
Calcium: 9.6 mg/dL (ref 8.4–10.5)
Chloride: 103 mEq/L (ref 96–112)
GFR: 44.44 mL/min — AB (ref >60.00)
GLUCOSE: 138 mg/dL — AB (ref 70–99)
Potassium: 4.5 mEq/L (ref 3.5–5.1)
Sodium: 136 mEq/L (ref 135–145)
TOTAL PROTEIN: 6.9 g/dL (ref 6.0–8.3)

## 2016-11-17 LAB — CBC WITH DIFFERENTIAL/PLATELET
BASOS ABS: 0.1 10*3/uL (ref 0.0–0.1)
Basophils Relative: 0.9 % (ref 0.0–3.0)
EOS ABS: 0.1 10*3/uL (ref 0.0–0.7)
Eosinophils Relative: 1.1 % (ref 0.0–5.0)
HEMATOCRIT: 32.2 % — AB (ref 36.0–46.0)
Hemoglobin: 10.9 g/dL — ABNORMAL LOW (ref 12.0–15.0)
LYMPHS ABS: 1.4 10*3/uL (ref 0.7–4.0)
LYMPHS PCT: 16.8 % (ref 12.0–46.0)
MCHC: 33.8 g/dL (ref 30.0–36.0)
MCV: 104.6 fl — AB (ref 78.0–100.0)
MONOS PCT: 6.3 % (ref 3.0–12.0)
Monocytes Absolute: 0.5 10*3/uL (ref 0.1–1.0)
NEUTROS ABS: 6.2 10*3/uL (ref 1.4–7.7)
NEUTROS PCT: 74.9 % (ref 43.0–77.0)
PLATELETS: 182 10*3/uL (ref 150.0–400.0)
RBC: 3.08 Mil/uL — ABNORMAL LOW (ref 3.87–5.11)
RDW: 12.8 % (ref 11.5–15.5)
WBC: 8.3 10*3/uL (ref 4.0–10.5)

## 2016-11-17 LAB — IBC PANEL
Iron: 97 ug/dL (ref 42–145)
Saturation Ratios: 46.2 % (ref 20.0–50.0)
Transferrin: 150 mg/dL — ABNORMAL LOW (ref 212.0–360.0)

## 2016-11-18 LAB — FERRITIN: Ferritin: 791 ng/mL — ABNORMAL HIGH (ref 10.0–291.0)

## 2016-12-02 ENCOUNTER — Encounter: Payer: Self-pay | Admitting: Internal Medicine

## 2016-12-03 MED ORDER — ALPRAZOLAM 0.25 MG PO TABS: 0.1250 mg | ORAL_TABLET | Freq: Every evening | ORAL | 3 refills | Status: DC | PRN

## 2016-12-17 ENCOUNTER — Other Ambulatory Visit (INDEPENDENT_AMBULATORY_CARE_PROVIDER_SITE_OTHER): Payer: Medicare Other

## 2016-12-17 DIAGNOSIS — D6489 Other specified anemias: Secondary | ICD-10-CM

## 2016-12-17 LAB — COMPREHENSIVE METABOLIC PANEL
ALBUMIN: 4.2 g/dL (ref 3.5–5.2)
ALK PHOS: 41 U/L (ref 39–117)
ALT: 10 U/L (ref 0–35)
AST: 19 U/L (ref 0–37)
BILIRUBIN TOTAL: 0.6 mg/dL (ref 0.2–1.2)
BUN: 25 mg/dL — ABNORMAL HIGH (ref 6–23)
CO2: 28 mEq/L (ref 19–32)
Calcium: 9.4 mg/dL (ref 8.4–10.5)
Chloride: 103 mEq/L (ref 96–112)
Creatinine, Ser: 1.26 mg/dL — ABNORMAL HIGH (ref 0.40–1.20)
GFR: 43.61 mL/min — ABNORMAL LOW (ref >60.00)
GLUCOSE: 88 mg/dL (ref 70–99)
POTASSIUM: 4.2 meq/L (ref 3.5–5.1)
SODIUM: 137 meq/L (ref 135–145)
TOTAL PROTEIN: 6.8 g/dL (ref 6.0–8.3)

## 2016-12-17 LAB — CBC WITH DIFFERENTIAL/PLATELET
BASOS ABS: 0 10*3/uL (ref 0.0–0.1)
Basophils Relative: 0.6 % (ref 0.0–3.0)
EOS ABS: 0.1 10*3/uL (ref 0.0–0.7)
EOS PCT: 1 % (ref 0.0–5.0)
HCT: 33.1 % — ABNORMAL LOW (ref 36.0–46.0)
HEMOGLOBIN: 11.2 g/dL — AB (ref 12.0–15.0)
LYMPHS ABS: 1.2 10*3/uL (ref 0.7–4.0)
Lymphocytes Relative: 18.3 % (ref 12.0–46.0)
MCHC: 33.8 g/dL (ref 30.0–36.0)
MCV: 104.1 fl — ABNORMAL HIGH (ref 78.0–100.0)
MONO ABS: 0.4 10*3/uL (ref 0.1–1.0)
Monocytes Relative: 5.5 % (ref 3.0–12.0)
NEUTROS PCT: 74.6 % (ref 43.0–77.0)
Neutro Abs: 5 10*3/uL (ref 1.4–7.7)
Platelets: 198 10*3/uL (ref 150.0–400.0)
RBC: 3.18 Mil/uL — AB (ref 3.87–5.11)
RDW: 12.8 % (ref 11.5–15.5)
WBC: 6.7 10*3/uL (ref 4.0–10.5)

## 2016-12-17 LAB — IBC PANEL
IRON: 75 ug/dL (ref 42–145)
SATURATION RATIOS: 34.6 % (ref 20.0–50.0)
TRANSFERRIN: 155 mg/dL — AB (ref 212.0–360.0)

## 2016-12-17 LAB — FERRITIN: Ferritin: 826.3 ng/mL — ABNORMAL HIGH (ref 10.0–291.0)

## 2017-01-14 ENCOUNTER — Other Ambulatory Visit (INDEPENDENT_AMBULATORY_CARE_PROVIDER_SITE_OTHER): Payer: Medicare Other

## 2017-01-14 DIAGNOSIS — D6489 Other specified anemias: Secondary | ICD-10-CM

## 2017-01-14 LAB — COMPREHENSIVE METABOLIC PANEL
ALBUMIN: 4.2 g/dL (ref 3.5–5.2)
ALK PHOS: 42 U/L (ref 39–117)
ALT: 10 U/L (ref 0–35)
AST: 19 U/L (ref 0–37)
BILIRUBIN TOTAL: 0.5 mg/dL (ref 0.2–1.2)
BUN: 35 mg/dL — AB (ref 6–23)
CO2: 26 mEq/L (ref 19–32)
Calcium: 9.4 mg/dL (ref 8.4–10.5)
Chloride: 102 mEq/L (ref 96–112)
Creatinine, Ser: 1.26 mg/dL — ABNORMAL HIGH (ref 0.40–1.20)
GFR: 43.61 mL/min — AB (ref >60.00)
Glucose, Bld: 87 mg/dL (ref 70–99)
POTASSIUM: 4.4 meq/L (ref 3.5–5.1)
SODIUM: 136 meq/L (ref 135–145)
TOTAL PROTEIN: 6.6 g/dL (ref 6.0–8.3)

## 2017-01-14 LAB — CBC WITH DIFFERENTIAL/PLATELET
Basophils Absolute: 0 10*3/uL (ref 0.0–0.1)
Basophils Relative: 0.6 % (ref 0.0–3.0)
EOS PCT: 1.4 % (ref 0.0–5.0)
Eosinophils Absolute: 0.1 10*3/uL (ref 0.0–0.7)
HCT: 31.7 % — ABNORMAL LOW (ref 36.0–46.0)
HEMOGLOBIN: 10.8 g/dL — AB (ref 12.0–15.0)
Lymphocytes Relative: 17.3 % (ref 12.0–46.0)
Lymphs Abs: 1.4 10*3/uL (ref 0.7–4.0)
MCHC: 34.1 g/dL (ref 30.0–36.0)
MCV: 103.6 fl — AB (ref 78.0–100.0)
Monocytes Absolute: 0.5 10*3/uL (ref 0.1–1.0)
Monocytes Relative: 6.5 % (ref 3.0–12.0)
Neutro Abs: 6.1 10*3/uL (ref 1.4–7.7)
Neutrophils Relative %: 74.2 % (ref 43.0–77.0)
Platelets: 218 10*3/uL (ref 150.0–400.0)
RBC: 3.06 Mil/uL — AB (ref 3.87–5.11)
RDW: 12.7 % (ref 11.5–15.5)
WBC: 8.2 10*3/uL (ref 4.0–10.5)

## 2017-01-14 LAB — IBC PANEL
Iron: 87 ug/dL (ref 42–145)
SATURATION RATIOS: 39.8 % (ref 20.0–50.0)
Transferrin: 156 mg/dL — ABNORMAL LOW (ref 212.0–360.0)

## 2017-01-14 LAB — FERRITIN: FERRITIN: 680.2 ng/mL — AB (ref 10.0–291.0)

## 2017-01-25 ENCOUNTER — Encounter: Payer: Self-pay | Admitting: Gastroenterology

## 2017-01-30 ENCOUNTER — Ambulatory Visit (INDEPENDENT_AMBULATORY_CARE_PROVIDER_SITE_OTHER): Payer: Medicare Other | Admitting: Internal Medicine

## 2017-01-30 ENCOUNTER — Encounter: Payer: Self-pay | Admitting: Internal Medicine

## 2017-01-30 VITALS — BP 118/58 | HR 80 | Temp 98.0°F | Resp 12 | Ht 64.0 in | Wt 121.0 lb

## 2017-01-30 DIAGNOSIS — I10 Essential (primary) hypertension: Secondary | ICD-10-CM

## 2017-01-30 DIAGNOSIS — Z0000 Encounter for general adult medical examination without abnormal findings: Secondary | ICD-10-CM | POA: Diagnosis not present

## 2017-01-30 DIAGNOSIS — I272 Pulmonary hypertension, unspecified: Secondary | ICD-10-CM

## 2017-01-30 DIAGNOSIS — D509 Iron deficiency anemia, unspecified: Secondary | ICD-10-CM

## 2017-01-30 DIAGNOSIS — E559 Vitamin D deficiency, unspecified: Secondary | ICD-10-CM

## 2017-01-30 DIAGNOSIS — N183 Chronic kidney disease, stage 3 unspecified: Secondary | ICD-10-CM

## 2017-01-30 NOTE — Progress Notes (Signed)
   Subjective:    Patient ID: Paula Ward, female    DOB: 1936/03/25, 81 y.o.   MRN: [id number]  HPI Here for medicare wellness and physical, no new complaints. Please see A/P for status and treatment of chronic medical problems.   Diet: heart healthy Physical activity: sedentary Depression/mood screen: negative Hearing: intact to whispered voice, mild loss Visual acuity: grossly normal with contacts, performs annual eye exam  ADLs: capable Fall risk: none Home safety: good Cognitive evaluation: intact to orientation, naming, recall and repetition EOL planning: adv directives discussed, in place  I have personally reviewed and have noted 1. The patient's medical and social history - reviewed today no changes 2. Their use of alcohol, tobacco or illicit drugs 3. Their current medications and supplements 4. The patient's functional ability including ADL's, fall risks, home safety risks and hearing or visual impairment. 5. Diet and physical activities 6. Evidence for depression or mood disorders 7. Care team reviewed and updated (available in snapshot)  Review of Systems  Constitutional: Negative.   HENT: Positive for congestion and postnasal drip. Negative for ear discharge, ear pain, rhinorrhea, sinus pain, sinus pressure, tinnitus, trouble swallowing and voice change.   Eyes: Negative.   Respiratory: Negative for cough, chest tightness and shortness of breath.   Cardiovascular: Negative for chest pain, palpitations and leg swelling.  Gastrointestinal: Negative for abdominal distention, abdominal pain, constipation, diarrhea, nausea and vomiting.  Musculoskeletal: Negative.   Skin: Negative.   Neurological: Negative.   Psychiatric/Behavioral: Negative.       Objective:   Physical Exam  Constitutional: She is oriented to person, place, and time. She appears well-developed and well-nourished.  HENT:  Head: Normocephalic and atraumatic.  Eyes: EOM are normal.  Neck: Normal  range of motion.  Cardiovascular: Normal rate and regular rhythm.   Pulmonary/Chest: Effort normal and breath sounds normal. No respiratory distress. She has no wheezes. She has no rales.  Abdominal: Soft. Bowel sounds are normal. She exhibits no distension. There is no tenderness. There is no rebound.  Musculoskeletal: She exhibits no edema.  Neurological: She is alert and oriented to person, place, and time. Coordination normal.  Skin: Skin is warm and dry.  Psychiatric: She has a normal mood and affect.   Vitals:   01/30/17 1428  BP: (!) 118/58  Pulse: 80  Resp: 12  Temp: 98 F (36.7 C)  TempSrc: Oral  SpO2: 99%  Weight: 121 lb (54.9 kg)  Height: _0  (1.626 m)      Assessment & Plan:

## 2017-01-30 NOTE — Progress Notes (Signed)
Pre visit review using our clinic review tool, if applicable. No additional management support is needed unless otherwise documented below in the visit note.

## 2017-01-30 NOTE — Assessment & Plan Note (Signed)
Checking CMP for stability.

## 2017-01-30 NOTE — Assessment & Plan Note (Signed)
Tetanus and flu and pneumonia series up to date. Counseled on shingrix (had zostavax already). Counseled about sun safety and mole surveillance. Colonoscopy and mammogram up to date. Given 10 year screening recommendations.

## 2017-01-30 NOTE — Patient Instructions (Addendum)
We will have you do the labs at the end of the month fasting to check.   The new shingles shot we talked about is called shingrix and is 2 shots to protect better against shingles.   Health Maintenance, Female Adopting a healthy lifestyle and getting preventive care can go a long way to promote health and wellness. Talk with your health care provider about what schedule of regular examinations is right for you. This is a good chance for you to check in with your provider about disease prevention and staying healthy. In between checkups, there are plenty of things you can do on your own. Experts have done a lot of research about which lifestyle changes and preventive measures are most likely to keep you healthy. Ask your health care provider for more information. Weight and diet Eat a healthy diet  Be sure to include plenty of vegetables, fruits, low-fat dairy products, and lean protein.  Do not eat a lot of foods high in solid fats, added sugars, or salt.  Get regular exercise. This is one of the most important things you can do for your health.  Most adults should exercise for at least 150 minutes each week. The exercise should increase your heart rate and make you sweat (moderate-intensity exercise).  Most adults should also do strengthening exercises at least twice a week. This is in addition to the moderate-intensity exercise. Maintain a healthy weight  Body mass index (BMI) is a measurement that can be used to identify possible weight problems. It estimates body fat based on height and weight. Your health care provider can help determine your BMI and help you achieve or maintain a healthy weight.  For females 46 years of age and older:  A BMI below 18.5 is considered underweight.  A BMI of 18.5 to 24.9 is normal.  A BMI of 25 to 29.9 is considered overweight.  A BMI of 30 and above is considered obese. Watch levels of cholesterol and blood lipids  You should start having your  blood tested for lipids and cholesterol at 79 years of age, then have this test every 5 years.  You may need to have your cholesterol levels checked more often if:  Your lipid or cholesterol levels are high.  You are older than 79 years of age.  You are at high risk for heart disease. Cancer screening Lung Cancer  Lung cancer screening is recommended for adults 19-62 years old who are at high risk for lung cancer because of a history of smoking.  A yearly low-dose CT scan of the lungs is recommended for people who:  Currently smoke.  Have quit within the past 15 years.  Have at least a 30-pack-year history of smoking. A pack year is smoking an average of one pack of cigarettes a day for 1 year.  Yearly screening should continue until it has been 15 years since you quit.  Yearly screening should stop if you develop a health problem that would prevent you from having lung cancer treatment. Breast Cancer  Practice breast self-awareness. This means understanding how your breasts normally appear and feel.  It also means doing regular breast self-exams. Let your health care provider know about any changes, no matter how small.  If you are in your 20s or 30s, you should have a clinical breast exam (CBE) by a health care provider every 1-3 years as part of a regular health exam.  If you are 62 or older, have a CBE every year.  Also consider having a breast X-ray (mammogram) every year.  If you have a family history of breast cancer, talk to your health care provider about genetic screening.  If you are at high risk for breast cancer, talk to your health care provider about having an MRI and a mammogram every year.  Breast cancer gene (BRCA) assessment is recommended for women who have family members with BRCA-related cancers. BRCA-related cancers include:  Breast.  Ovarian.  Tubal.  Peritoneal cancers.  Results of the assessment will determine the need for genetic counseling  and BRCA1 and BRCA2 testing. Cervical Cancer  Your health care provider may recommend that you be screened regularly for cancer of the pelvic organs (ovaries, uterus, and vagina). This screening involves a pelvic examination, including checking for microscopic changes to the surface of your cervix (Pap test). You may be encouraged to have this screening done every 3 years, beginning at age 47.  For women ages 80-65, health care providers may recommend pelvic exams and Pap testing every 3 years, or they may recommend the Pap and pelvic exam, combined with testing for human papilloma virus (HPV), every 5 years. Some types of HPV increase your risk of cervical cancer. Testing for HPV may also be done on women of any age with unclear Pap test results.  Other health care providers may not recommend any screening for nonpregnant women who are considered low risk for pelvic cancer and who do not have symptoms. Ask your health care provider if a screening pelvic exam is right for you.  If you have had past treatment for cervical cancer or a condition that could lead to cancer, you need Pap tests and screening for cancer for at least 20 years after your treatment. If Pap tests have been discontinued, your risk factors (such as having a new sexual partner) need to be reassessed to determine if screening should resume. Some women have medical problems that increase the chance of getting cervical cancer. In these cases, your health care provider may recommend more frequent screening and Pap tests. Colorectal Cancer  This type of cancer can be detected and often prevented.  Routine colorectal cancer screening usually begins at 79 years of age and continues through 79 years of age.  Your health care provider may recommend screening at an earlier age if you have risk factors for colon cancer.  Your health care provider may also recommend using home test kits to check for hidden blood in the stool.  A small  camera at the end of a tube can be used to examine your colon directly (sigmoidoscopy or colonoscopy). This is done to check for the earliest forms of colorectal cancer.  Routine screening usually begins at age 36.  Direct examination of the colon should be repeated every 5-10 years through 79 years of age. However, you may need to be screened more often if early forms of precancerous polyps or small growths are found. Skin Cancer  Check your skin from head to toe regularly.  Tell your health care provider about any new moles or changes in moles, especially if there is a change in a mole's shape or color.  Also tell your health care provider if you have a mole that is larger than the size of a pencil eraser.  Always use sunscreen. Apply sunscreen liberally and repeatedly throughout the day.  Protect yourself by wearing long sleeves, pants, a wide-brimmed hat, and sunglasses whenever you are outside. Heart disease, diabetes, and high blood pressure  High blood pressure causes heart disease and increases the risk of stroke. High blood pressure is more likely to develop in:  People who have blood pressure in the high end of the normal range (130-139/85-89 mm Hg).  People who are overweight or obese.  People who are African American.  If you are 38-93 years of age, have your blood pressure checked every 3-5 years. If you are 17 years of age or older, have your blood pressure checked every year. You should have your blood pressure measured twice-once when you are at a hospital or clinic, and once when you are not at a hospital or clinic. Record the average of the two measurements. To check your blood pressure when you are not at a hospital or clinic, you can use:  An automated blood pressure machine at a pharmacy.  A home blood pressure monitor.  If you are between 59 years and 36 years old, ask your health care provider if you should take aspirin to prevent strokes.  Have regular  diabetes screenings. This involves taking a blood sample to check your fasting blood sugar level.  If you are at a normal weight and have a low risk for diabetes, have this test once every three years after 79 years of age.  If you are overweight and have a high risk for diabetes, consider being tested at a younger age or more often. Preventing infection Hepatitis B  If you have a higher risk for hepatitis B, you should be screened for this virus. You are considered at high risk for hepatitis B if:  You were born in a country where hepatitis B is common. Ask your health care provider which countries are considered high risk.  Your parents were born in a high-risk country, and you have not been immunized against hepatitis B (hepatitis B vaccine).  You have HIV or AIDS.  You use needles to inject street drugs.  You live with someone who has hepatitis B.  You have had sex with someone who has hepatitis B.  You get hemodialysis treatment.  You take certain medicines for conditions, including cancer, organ transplantation, and autoimmune conditions. Hepatitis C  Blood testing is recommended for:  Everyone born from [birth year] through 1963.  Anyone with known risk factors for hepatitis C. Sexually transmitted infections (STIs)  You should be screened for sexually transmitted infections (STIs) including gonorrhea and chlamydia if:  You are sexually active and are younger than 79 years of age.  You are older than 79 years of age and your health care provider tells you that you are at risk for this type of infection.  Your sexual activity has changed since you were last screened and you are at an increased risk for chlamydia or gonorrhea. Ask your health care provider if you are at risk.  If you do not have HIV, but are at risk, it may be recommended that you take a prescription medicine daily to prevent HIV infection. This is called pre-exposure prophylaxis (PrEP). You are considered at  risk if:  You are sexually active and do not regularly use condoms or know the HIV status of your partner(s).  You take drugs by injection.  You are sexually active with a partner who has HIV. Talk with your health care provider about whether you are at high risk of being infected with HIV. If you choose to begin PrEP, you should first be tested for HIV. You should then be tested every 3 months for as long  as you are taking PrEP. Pregnancy  If you are premenopausal and you may become pregnant, ask your health care provider about preconception counseling.  If you may become pregnant, take 400 to 800 micrograms (mcg) of folic acid every day.  If you want to prevent pregnancy, talk to your health care provider about birth control (contraception). Osteoporosis and menopause  Osteoporosis is a disease in which the bones lose minerals and strength with aging. This can result in serious bone fractures. Your risk for osteoporosis can be identified using a bone density scan.  If you are 104 years of age or older, or if you are at risk for osteoporosis and fractures, ask your health care provider if you should be screened.  Ask your health care provider whether you should take a calcium or vitamin D supplement to lower your risk for osteoporosis.  Menopause may have certain physical symptoms and risks.  Hormone replacement therapy may reduce some of these symptoms and risks. Talk to your health care provider about whether hormone replacement therapy is right for you. Follow these instructions at home:  Schedule regular health, dental, and eye exams.  Stay current with your immunizations.  Do not use any tobacco products including cigarettes, chewing tobacco, or electronic cigarettes.  If you are pregnant, do not drink alcohol.  If you are breastfeeding, limit how much and how often you drink alcohol.  Limit alcohol intake to no more than 1 drink per day for nonpregnant women. One drink  equals 12 ounces of beer, 5 ounces of wine, or 1 ounces of hard liquor.  Do not use street drugs.  Do not share needles.  Ask your health care provider for help if you need support or information about quitting drugs.  Tell your health care provider if you often feel depressed.  Tell your health care provider if you have ever been abused or do not feel safe at home. This information is not intended to replace advice given to you by your health care provider. Make sure you discuss any questions you have with your health care provider. Document Released: 04/21/2011 Document Revised: 03/13/2016 Document Reviewed: 07/10/2015 Elsevier Interactive Patient Education  2017 [employer].

## 2017-01-30 NOTE — Assessment & Plan Note (Signed)
BP at goal on losartan and diltiazem, checking CMP and adjust as needed.

## 2017-01-30 NOTE — Assessment & Plan Note (Signed)
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

## 2017-02-11 ENCOUNTER — Other Ambulatory Visit (INDEPENDENT_AMBULATORY_CARE_PROVIDER_SITE_OTHER): Payer: Medicare Other

## 2017-02-11 DIAGNOSIS — E559 Vitamin D deficiency, unspecified: Secondary | ICD-10-CM

## 2017-02-11 DIAGNOSIS — I272 Pulmonary hypertension, unspecified: Secondary | ICD-10-CM | POA: Diagnosis not present

## 2017-02-11 LAB — COMPREHENSIVE METABOLIC PANEL
ALBUMIN: 4.3 g/dL (ref 3.5–5.2)
ALT: 11 U/L (ref 0–35)
AST: 20 U/L (ref 0–37)
Alkaline Phosphatase: 41 U/L (ref 39–117)
BUN: 31 mg/dL — ABNORMAL HIGH (ref 6–23)
CALCIUM: 9.7 mg/dL (ref 8.4–10.5)
CHLORIDE: 106 meq/L (ref 96–112)
CO2: 27 mEq/L (ref 19–32)
Creatinine, Ser: 1.27 mg/dL — ABNORMAL HIGH (ref 0.40–1.20)
GFR: 43.2 mL/min — ABNORMAL LOW (ref >60.00)
Glucose, Bld: 93 mg/dL (ref 70–99)
POTASSIUM: 4.4 meq/L (ref 3.5–5.1)
SODIUM: 140 meq/L (ref 135–145)
Total Bilirubin: 0.5 mg/dL (ref 0.2–1.2)
Total Protein: 7 g/dL (ref 6.0–8.3)

## 2017-02-11 LAB — CBC
HEMATOCRIT: 33.3 % — AB (ref 36.0–46.0)
HEMOGLOBIN: 11.3 g/dL — AB (ref 12.0–15.0)
MCHC: 33.9 g/dL (ref 30.0–36.0)
MCV: 104.2 fl — ABNORMAL HIGH (ref 78.0–100.0)
Platelets: 211 10*3/uL (ref 150.0–400.0)
RBC: 3.19 Mil/uL — AB (ref 3.87–5.11)
RDW: 12.8 % (ref 11.5–15.5)
WBC: 7.3 10*3/uL (ref 4.0–10.5)

## 2017-02-11 LAB — LIPID PANEL
CHOLESTEROL: 199 mg/dL (ref 0–200)
HDL: 60.7 mg/dL (ref >39.00)
LDL CALC: 115 mg/dL — AB (ref 0–99)
NonHDL: 137.9
TRIGLYCERIDES: 115 mg/dL (ref 0.0–149.0)
Total CHOL/HDL Ratio: 3
VLDL: 23 mg/dL (ref 0.0–40.0)

## 2017-02-11 LAB — VITAMIN D 25 HYDROXY (VIT D DEFICIENCY, FRACTURES): VITD: 85.04 ng/mL (ref 30.00–100.00)

## 2017-02-12 ENCOUNTER — Encounter: Payer: Self-pay | Admitting: Internal Medicine

## 2017-03-18 ENCOUNTER — Other Ambulatory Visit (INDEPENDENT_AMBULATORY_CARE_PROVIDER_SITE_OTHER): Payer: Medicare Other

## 2017-03-18 DIAGNOSIS — I272 Pulmonary hypertension, unspecified: Secondary | ICD-10-CM

## 2017-03-18 LAB — CBC
HEMATOCRIT: 31.9 % — AB (ref 36.0–46.0)
Hemoglobin: 10.8 g/dL — ABNORMAL LOW (ref 12.0–15.0)
MCHC: 34.1 g/dL (ref 30.0–36.0)
MCV: 103.9 fl — ABNORMAL HIGH (ref 78.0–100.0)
Platelets: 214 10*3/uL (ref 150.0–400.0)
RBC: 3.06 Mil/uL — AB (ref 3.87–5.11)
RDW: 12.6 % (ref 11.5–15.5)
WBC: 7.7 10*3/uL (ref 4.0–10.5)

## 2017-03-19 ENCOUNTER — Encounter: Payer: Self-pay | Admitting: Internal Medicine

## 2017-03-19 DIAGNOSIS — I272 Pulmonary hypertension, unspecified: Secondary | ICD-10-CM

## 2017-04-14 ENCOUNTER — Other Ambulatory Visit (INDEPENDENT_AMBULATORY_CARE_PROVIDER_SITE_OTHER): Payer: Medicare Other

## 2017-04-14 DIAGNOSIS — I272 Pulmonary hypertension, unspecified: Secondary | ICD-10-CM

## 2017-04-14 LAB — CBC
HCT: 30.3 % — ABNORMAL LOW (ref 36.0–46.0)
HEMOGLOBIN: 10.4 g/dL — AB (ref 12.0–15.0)
MCHC: 34.2 g/dL (ref 30.0–36.0)
MCV: 104.5 fl — AB (ref 78.0–100.0)
Platelets: 211 10*3/uL (ref 150.0–400.0)
RBC: 2.9 Mil/uL — ABNORMAL LOW (ref 3.87–5.11)
RDW: 12.5 % (ref 11.5–15.5)
WBC: 7.7 10*3/uL (ref 4.0–10.5)

## 2017-04-14 LAB — COMPREHENSIVE METABOLIC PANEL
ALT: 10 U/L (ref 0–35)
AST: 20 U/L (ref 0–37)
Albumin: 4.3 g/dL (ref 3.5–5.2)
Alkaline Phosphatase: 42 U/L (ref 39–117)
BILIRUBIN TOTAL: 0.5 mg/dL (ref 0.2–1.2)
BUN: 29 mg/dL — ABNORMAL HIGH (ref 6–23)
CHLORIDE: 104 meq/L (ref 96–112)
CO2: 26 meq/L (ref 19–32)
Calcium: 9.6 mg/dL (ref 8.4–10.5)
Creatinine, Ser: 1.18 mg/dL (ref 0.40–1.20)
GFR: 47 mL/min — AB (ref >60.00)
GLUCOSE: 98 mg/dL (ref 70–99)
POTASSIUM: 4.5 meq/L (ref 3.5–5.1)
Sodium: 138 mEq/L (ref 135–145)
Total Protein: 6.6 g/dL (ref 6.0–8.3)

## 2017-04-15 ENCOUNTER — Other Ambulatory Visit: Payer: Self-pay | Admitting: Internal Medicine

## 2017-04-15 ENCOUNTER — Telehealth: Payer: Self-pay

## 2017-04-15 NOTE — Telephone Encounter (Signed)
Informed pt, she expresssed understanding.

## 2017-04-15 NOTE — Telephone Encounter (Signed)
-----  Message from Biagio Borg, MD sent at 04/15/2017  3:07 PM EDT ----- Stable, no changes at this time

## 2017-05-13 ENCOUNTER — Other Ambulatory Visit: Payer: Self-pay

## 2017-05-13 ENCOUNTER — Other Ambulatory Visit: Payer: Medicare Other

## 2017-05-13 ENCOUNTER — Other Ambulatory Visit (INDEPENDENT_AMBULATORY_CARE_PROVIDER_SITE_OTHER): Payer: Medicare Other

## 2017-05-13 DIAGNOSIS — D500 Iron deficiency anemia secondary to blood loss (chronic): Secondary | ICD-10-CM

## 2017-05-13 DIAGNOSIS — I272 Pulmonary hypertension, unspecified: Secondary | ICD-10-CM

## 2017-05-13 LAB — COMPREHENSIVE METABOLIC PANEL
ALBUMIN: 4.3 g/dL (ref 3.5–5.2)
ALT: 9 U/L (ref 0–35)
AST: 20 U/L (ref 0–37)
Alkaline Phosphatase: 40 U/L (ref 39–117)
BUN: 33 mg/dL — AB (ref 6–23)
CHLORIDE: 105 meq/L (ref 96–112)
CO2: 26 mEq/L (ref 19–32)
CREATININE: 1.42 mg/dL — AB (ref 0.40–1.20)
Calcium: 9.8 mg/dL (ref 8.4–10.5)
GFR: 37.95 mL/min — ABNORMAL LOW (ref >60.00)
GLUCOSE: 101 mg/dL — AB (ref 70–99)
POTASSIUM: 4.4 meq/L (ref 3.5–5.1)
SODIUM: 138 meq/L (ref 135–145)
Total Bilirubin: 0.5 mg/dL (ref 0.2–1.2)
Total Protein: 7.1 g/dL (ref 6.0–8.3)

## 2017-05-13 LAB — CBC
HEMATOCRIT: 30.8 % — AB (ref 36.0–46.0)
Hemoglobin: 10.3 g/dL — ABNORMAL LOW (ref 12.0–15.0)
MCHC: 33.5 g/dL (ref 30.0–36.0)
MCV: 105.3 fl — AB (ref 78.0–100.0)
Platelets: 221 10*3/uL (ref 150.0–400.0)
RBC: 2.92 Mil/uL — ABNORMAL LOW (ref 3.87–5.11)
RDW: 12.6 % (ref 11.5–15.5)
WBC: 6.2 10*3/uL (ref 4.0–10.5)

## 2017-05-14 LAB — IRON,TIBC AND FERRITIN PANEL
FERRITIN: 896 ng/mL — AB (ref 15–150)
IRON SATURATION: 27 % (ref 15–55)
Iron: 54 ug/dL (ref 27–139)
TIBC: 199 ug/dL — AB (ref 250–450)
UIBC: 145 ug/dL (ref 118–369)

## 2017-05-18 ENCOUNTER — Encounter: Payer: Self-pay | Admitting: Nurse Practitioner

## 2017-05-18 ENCOUNTER — Ambulatory Visit (INDEPENDENT_AMBULATORY_CARE_PROVIDER_SITE_OTHER): Payer: Medicare Other | Admitting: Nurse Practitioner

## 2017-05-18 VITALS — BP 110/46 | HR 80 | Temp 97.8°F | Ht 64.0 in | Wt 119.0 lb

## 2017-05-18 DIAGNOSIS — I952 Hypotension due to drugs: Secondary | ICD-10-CM | POA: Diagnosis not present

## 2017-05-18 DIAGNOSIS — N183 Chronic kidney disease, stage 3 unspecified: Secondary | ICD-10-CM

## 2017-05-18 NOTE — Progress Notes (Signed)
Subjective:  Patient ID: Paula Ward, female    DOB: [birth year]  Age: 81 y.o. MRN: [id number]  CC: Follow-up (discuss BP--pt is taking medication that effect her kidney/sinus?--ear stop up)   HPI  Report home BP reading on 85/60. Intermittent lightheadness with rapid head movement or standing up. No syncope. No congestion, Will like to know if losartan needs to be adjusted.  Outpatient Medications Prior to Visit  Medication Sig Dispense Refill  . acetaminophen (TYLENOL) 325 MG tablet Take 650 mg by mouth every 6 (six) hours as needed for mild pain.     Marland Kitchen albuterol (PROVENTIL HFA;VENTOLIN HFA) 108 (90 BASE) MCG/ACT inhaler Inhale 2 puffs into the lungs every 6 (six) hours as needed for wheezing or shortness of breath. 1 Inhaler 3  . ALPRAZolam (XANAX) 0.25 MG tablet Take 0.5 tablets (0.125 mg total) by mouth at bedtime as needed. 30 tablet 3  . Cyanocobalamin (VITAMIN B-12 PO) Take 1 tablet by mouth daily.     Marland Kitchen diltiazem (CARDIZEM CD) 120 MG 24 hr capsule Take 120 mg by mouth daily.      . fluticasone (FLONASE) 50 MCG/ACT nasal spray instill 1 spray into each nostril twice a day if needed 48 g 3  . loratadine (CLARITIN) 10 MG tablet Take 10 mg by mouth daily as needed for allergies.     Marland Kitchen losartan (COZAAR) 25 MG tablet Take 25 mg by mouth daily as needed (Pt is instructed by doctor to take based on kidney lab work).     Marland Kitchen omeprazole (PRILOSEC) 40 MG capsule Take 1 capsule (40 mg total) by mouth 2 (two) times daily. 60 capsule 5  . OPSUMIT 10 MG TABS Take 1 tablet by mouth daily.     . Pyridoxine HCl (VITAMIN B6 PO) Take 1 tablet by mouth daily.     Marland Kitchen triamcinolone cream (KENALOG) 0.1 % Apply 1 application topically daily as needed (Irritated skin).      No facility-administered medications prior to visit.     ROS See HPI  Objective:  BP (!) 110/46   Pulse 80   Temp 97.8 F (36.6 C)   Ht _0  (1.626 m)   Wt 119 lb (54 kg)   SpO2 99%   BMI 20.43 kg/m   BP Readings  from Last 3 Encounters:  05/18/17 (!) 110/46  01/30/17 (!) 118/58  11/03/16 (!) 110/56    Wt Readings from Last 3 Encounters:  05/18/17 119 lb (54 kg)  01/30/17 121 lb (54.9 kg)  11/03/16 119 lb (54 kg)    Physical Exam  Constitutional: No distress.  Cardiovascular: Normal rate, regular rhythm and normal heart sounds.   Pulmonary/Chest: Effort normal and breath sounds normal.  Musculoskeletal: She exhibits no edema.  Neurological: She is alert.  Skin: Skin is warm and dry.  Psychiatric: She has a normal mood and affect. Her behavior is normal.  Vitals reviewed.   Lab Results  Component Value Date   WBC 6.2 05/13/2017   HGB 10.3 (L) 05/13/2017   HCT 30.8 (L) 05/13/2017   PLT 221.0 05/13/2017   GLUCOSE 101 (H) 05/13/2017   CHOL 199 02/11/2017   TRIG 115.0 02/11/2017   HDL 60.70 02/11/2017   LDLCALC 115 (H) 02/11/2017   ALT 9 05/13/2017   AST 20 05/13/2017   NA 138 05/13/2017   K 4.4 05/13/2017   CL 105 05/13/2017   CREATININE 1.42 (H) 05/13/2017   BUN 33 (H) 05/13/2017   CO2 26 05/13/2017  TSH 3.83 01/30/2014   INR 1.11 04/03/2011    Dg Chest 2 View  Result Date: 09/17/2016 CLINICAL DATA:  Ten days of substernal chest pain. History of scleroderma, pulmonary hypertension, previous pulmonary embolism, interstitial lung disease EXAM: CHEST  2 VIEW COMPARISON:  Chest x-ray of August 19, 2016 FINDINGS: The lungs are well-expanded. The interstitial markings are coarse though stable. There is no alveolar infiltrate or pleural effusion. The cardiac silhouette remains enlarged. The pulmonary vascularity is not engorged. There is calcification in the wall of the aortic arch. The patient has undergone previous breast surgery for malignancy. The observed bony thorax exhibits no acute abnormalities. IMPRESSION: Stable cardiomegaly without pulmonary vascular congestion. Thoracic aortic atherosclerosis. Chronic interstitial changes throughout both lungs. Stable scarring at the left  lung base. Electronically Signed   By: David  Martinique M.D.   On: 09/17/2016 14:34    Assessment & Plan:   Taylynn was seen today for follow-up.  Diagnoses and all orders for this visit:  CKD (chronic kidney disease) stage 3, GFR 30-59 ml/min  Hypotension due to drugs   I am having Ms. Buth maintain her diltiazem, loratadine, acetaminophen, losartan, OPSUMIT, albuterol, Pyridoxine HCl (VITAMIN B6 PO), Cyanocobalamin (VITAMIN B-12 PO), triamcinolone cream, omeprazole, ALPRAZolam, and fluticasone.  No orders of the defined types were placed in this encounter.   Follow-up: Return if symptoms worsen or fail to improve.  Wilfred Lacy, NP

## 2017-05-18 NOTE — Patient Instructions (Addendum)
Hold losartan till next lab draw. Continue to check BP once a day. Encourage adequate oral hydration.  Stable kidney function at this time. Creatinine (1.3-1.4) and GFr are at baseline (stabge 3) Repeat CMP and CBC in 39monthas scheduled. You will be called with results.

## 2017-06-05 ENCOUNTER — Ambulatory Visit (INDEPENDENT_AMBULATORY_CARE_PROVIDER_SITE_OTHER): Payer: Medicare Other | Admitting: Nurse Practitioner

## 2017-06-05 ENCOUNTER — Encounter: Payer: Self-pay | Admitting: Nurse Practitioner

## 2017-06-05 VITALS — BP 110/52 | HR 76 | Temp 98.2°F | Ht 64.0 in | Wt 120.0 lb

## 2017-06-05 DIAGNOSIS — N183 Chronic kidney disease, stage 3 unspecified: Secondary | ICD-10-CM

## 2017-06-05 DIAGNOSIS — D509 Iron deficiency anemia, unspecified: Secondary | ICD-10-CM | POA: Diagnosis not present

## 2017-06-05 DIAGNOSIS — N3001 Acute cystitis with hematuria: Secondary | ICD-10-CM

## 2017-06-05 DIAGNOSIS — I10 Essential (primary) hypertension: Secondary | ICD-10-CM | POA: Diagnosis not present

## 2017-06-05 LAB — POCT URINALYSIS DIPSTICK
Bilirubin, UA: NEGATIVE
Glucose, UA: NEGATIVE
Ketones, UA: NEGATIVE
Nitrite, UA: POSITIVE
PH UA: 6 (ref 5.0–8.0)
Spec Grav, UA: 1.025 (ref 1.010–1.025)
UROBILINOGEN UA: 0.2 U/dL

## 2017-06-05 MED ORDER — CIPROFLOXACIN HCL 250 MG PO TABS: 250.0000 mg | ORAL_TABLET | Freq: Two times a day (BID) | ORAL | 0 refills | Status: DC

## 2017-06-05 MED ORDER — PHENAZOPYRIDINE HCL 100 MG PO TABS: 100.0000 mg | ORAL_TABLET | Freq: Three times a day (TID) | ORAL | 0 refills | Status: DC | PRN

## 2017-06-05 NOTE — Patient Instructions (Addendum)
Push oral hydration. Call office if no improvement by Monday.  Go to basement for blood draw prior to next office visit.  Urinary Tract Infection, Adult A urinary tract infection (UTI) is an infection of any part of the urinary tract. The urinary tract includes the:  Kidneys.  Ureters.  Bladder.  Urethra.  These organs make, store, and get rid of pee (urine) in the body. Follow these instructions at home:  Take over-the-counter and prescription medicines only as told by your doctor.  If you were prescribed an antibiotic medicine, take it as told by your doctor. Do not stop taking the antibiotic even if you start to feel better.  Avoid the following drinks: ? Alcohol. ? Caffeine. ? Tea. ? Carbonated drinks.  Drink enough fluid to keep your pee clear or pale yellow.  Keep all follow-up visits as told by your doctor. This is important.  Make sure to: ? Empty your bladder often and completely. Do not to hold pee for long periods of time. ? Empty your bladder before and after sex. ? Wipe from front to back after a bowel movement if you are female. Use each tissue one time when you wipe. Contact a doctor if:  You have back pain.  You have a fever.  You feel sick to your stomach (nauseous).  You throw up (vomit).  Your symptoms do not get better after 3 days.  Your symptoms go away and then come back. Get help right away if:  You have very bad back pain.  You have very bad lower belly (abdominal) pain.  You are throwing up and cannot keep down any medicines or water. This information is not intended to replace advice given to you by your health care provider. Make sure you discuss any questions you have with your health care provider. Document Released: 03/24/2008 Document Revised: 03/13/2016 Document Reviewed: 08/27/2015 Elsevier Interactive Patient Education  [employer].

## 2017-06-05 NOTE — Progress Notes (Signed)
Subjective:  Patient ID: Paula Ward, female    DOB: Dec 12, 1935  Age: 81 y.o. MRN: [id number]  CC: Urinary Tract Infection (burning when urinate,headache--going on 1 day. /no sulfa drug per pt. )   Urinary Tract Infection   This is a new problem. The current episode started yesterday. The problem occurs every urination. The problem has been gradually worsening. The quality of the pain is described as burning. The pain is mild. She is not sexually active. Associated symptoms include chills, frequency and urgency. Pertinent negatives include no discharge or hesitancy. She has tried nothing for the symptoms. There is no history of recurrent UTIs or urinary stasis.    Outpatient Medications Prior to Visit  Medication Sig Dispense Refill  . acetaminophen (TYLENOL) 325 MG tablet Take 650 mg by mouth every 6 (six) hours as needed for mild pain.     Marland Kitchen albuterol (PROVENTIL HFA;VENTOLIN HFA) 108 (90 BASE) MCG/ACT inhaler Inhale 2 puffs into the lungs every 6 (six) hours as needed for wheezing or shortness of breath. 1 Inhaler 3  . ALPRAZolam (XANAX) 0.25 MG tablet Take 0.5 tablets (0.125 mg total) by mouth at bedtime as needed. 30 tablet 3  . Cyanocobalamin (VITAMIN B-12 PO) Take 1 tablet by mouth daily.     Marland Kitchen diltiazem (CARDIZEM CD) 120 MG 24 hr capsule Take 120 mg by mouth daily.      . fluticasone (FLONASE) 50 MCG/ACT nasal spray instill 1 spray into each nostril twice a day if needed 48 g 3  . loratadine (CLARITIN) 10 MG tablet Take 10 mg by mouth daily as needed for allergies.     Marland Kitchen losartan (COZAAR) 25 MG tablet Take 25 mg by mouth daily as needed (Pt is instructed by doctor to take based on kidney lab work).     Marland Kitchen omeprazole (PRILOSEC) 40 MG capsule Take 1 capsule (40 mg total) by mouth 2 (two) times daily. 60 capsule 5  . OPSUMIT 10 MG TABS Take 1 tablet by mouth daily.     . Pyridoxine HCl (VITAMIN B6 PO) Take 1 tablet by mouth daily.     Marland Kitchen triamcinolone cream (KENALOG) 0.1 % Apply 1  application topically daily as needed (Irritated skin).      No facility-administered medications prior to visit.     ROS See HPI  Objective:  BP (!) 110/52   Pulse 76   Temp 98.2 F (36.8 C)   Ht 5' 4" (1.626 m)   Wt 120 lb (54.4 kg)   SpO2 98%   BMI 20.60 kg/m   BP Readings from Last 3 Encounters:  06/05/17 (!) 110/52  05/18/17 (!) 110/46  01/30/17 (!) 118/58    Wt Readings from Last 3 Encounters:  06/05/17 120 lb (54.4 kg)  05/18/17 119 lb (54 kg)  01/30/17 121 lb (54.9 kg)    Physical Exam  Constitutional: She is oriented to person, place, and time. No distress.  Cardiovascular: Normal rate.   Pulmonary/Chest: Effort normal.  Abdominal: Soft. Bowel sounds are normal. She exhibits no distension. There is tenderness.  Suprapubic tenderness  Neurological: She is alert and oriented to person, place, and time.  Skin: Skin is warm and dry.  Vitals reviewed.   Lab Results  Component Value Date   WBC 6.2 05/13/2017   HGB 10.3 (L) 05/13/2017   HCT 30.8 (L) 05/13/2017   PLT 221.0 05/13/2017   GLUCOSE 101 (H) 05/13/2017   CHOL 199 02/11/2017   TRIG 115.0 02/11/2017  HDL 60.70 02/11/2017   LDLCALC 115 (H) 02/11/2017   ALT 9 05/13/2017   AST 20 05/13/2017   NA 138 05/13/2017   K 4.4 05/13/2017   CL 105 05/13/2017   CREATININE 1.42 (H) 05/13/2017   BUN 33 (H) 05/13/2017   CO2 26 05/13/2017   TSH 3.83 01/30/2014   INR 1.11 04/03/2011    Dg Chest 2 View  Result Date: 09/17/2016 CLINICAL DATA:  Ten days of substernal chest pain. History of scleroderma, pulmonary hypertension, previous pulmonary embolism, interstitial lung disease EXAM: CHEST  2 VIEW COMPARISON:  Chest x-ray of August 19, 2016 FINDINGS: The lungs are well-expanded. The interstitial markings are coarse though stable. There is no alveolar infiltrate or pleural effusion. The cardiac silhouette remains enlarged. The pulmonary vascularity is not engorged. There is calcification in the wall of the  aortic arch. The patient has undergone previous breast surgery for malignancy. The observed bony thorax exhibits no acute abnormalities. IMPRESSION: Stable cardiomegaly without pulmonary vascular congestion. Thoracic aortic atherosclerosis. Chronic interstitial changes throughout both lungs. Stable scarring at the left lung base. Electronically Signed   By: David  Martinique M.D.   On: 09/17/2016 14:34    Assessment & Plan:   Paula Ward was seen today for urinary tract infection.  Diagnoses and all orders for this visit:  Acute cystitis with hematuria -     POCT urinalysis dipstick -     phenazopyridine (PYRIDIUM) 100 MG tablet; Take 1 tablet (100 mg total) by mouth 3 (three) times daily as needed for pain (with food). -     ciprofloxacin (CIPRO) 250 MG tablet; Take 1 tablet (250 mg total) by mouth 2 (two) times daily.  Essential hypertension -     Basic metabolic panel; Future  CKD (chronic kidney disease) stage 3, GFR 30-59 ml/min -     Basic metabolic panel; Future  Iron deficiency anemia, unspecified iron deficiency anemia type -     CBC; Future   I am having Paula Ward start on phenazopyridine and ciprofloxacin. I am also having her maintain her diltiazem, loratadine, acetaminophen, losartan, OPSUMIT, albuterol, Pyridoxine HCl (VITAMIN B6 PO), Cyanocobalamin (VITAMIN B-12 PO), triamcinolone cream, omeprazole, ALPRAZolam, and fluticasone.  Meds ordered this encounter  Medications  . phenazopyridine (PYRIDIUM) 100 MG tablet    Sig: Take 1 tablet (100 mg total) by mouth 3 (three) times daily as needed for pain (with food).    Dispense:  10 tablet    Refill:  0    Order Specific Question:   Supervising Provider    Answer:   Cassandria Anger [[id number]]  . ciprofloxacin (CIPRO) 250 MG tablet    Sig: Take 1 tablet (250 mg total) by mouth 2 (two) times daily.    Dispense:  14 tablet    Refill:  0    Order Specific Question:   Supervising Provider    Answer:   Cassandria Anger [[id number]]      Follow-up: Return in about 2 weeks (around 06/19/2017) for hypotension.  Wilfred Lacy, NP

## 2017-06-15 ENCOUNTER — Telehealth: Payer: Self-pay | Admitting: Nurse Practitioner

## 2017-06-15 NOTE — Telephone Encounter (Signed)
ROI fax to [facility]

## 2017-06-16 ENCOUNTER — Other Ambulatory Visit: Payer: Medicare Other

## 2017-06-16 ENCOUNTER — Other Ambulatory Visit (INDEPENDENT_AMBULATORY_CARE_PROVIDER_SITE_OTHER): Payer: Medicare Other

## 2017-06-16 DIAGNOSIS — D509 Iron deficiency anemia, unspecified: Secondary | ICD-10-CM | POA: Diagnosis not present

## 2017-06-16 DIAGNOSIS — N183 Chronic kidney disease, stage 3 unspecified: Secondary | ICD-10-CM

## 2017-06-16 DIAGNOSIS — I10 Essential (primary) hypertension: Secondary | ICD-10-CM

## 2017-06-16 LAB — BASIC METABOLIC PANEL
BUN: 28 mg/dL — ABNORMAL HIGH (ref 6–23)
CALCIUM: 9.7 mg/dL (ref 8.4–10.5)
CO2: 29 meq/L (ref 19–32)
CREATININE: 1.43 mg/dL — AB (ref 0.40–1.20)
Chloride: 103 mEq/L (ref 96–112)
GFR: 37.64 mL/min — ABNORMAL LOW (ref >60.00)
GLUCOSE: 115 mg/dL — AB (ref 70–99)
Potassium: 4.5 mEq/L (ref 3.5–5.1)
Sodium: 138 mEq/L (ref 135–145)

## 2017-06-16 LAB — CBC
HEMATOCRIT: 31.9 % — AB (ref 36.0–46.0)
HEMOGLOBIN: 10.6 g/dL — AB (ref 12.0–15.0)
MCHC: 33.4 g/dL (ref 30.0–36.0)
MCV: 105.3 fl — ABNORMAL HIGH (ref 78.0–100.0)
Platelets: 219 10*3/uL (ref 150.0–400.0)
RBC: 3.03 Mil/uL — ABNORMAL LOW (ref 3.87–5.11)
RDW: 12.6 % (ref 11.5–15.5)
WBC: 6.2 10*3/uL (ref 4.0–10.5)

## 2017-06-19 ENCOUNTER — Ambulatory Visit (INDEPENDENT_AMBULATORY_CARE_PROVIDER_SITE_OTHER): Payer: Medicare Other | Admitting: Nurse Practitioner

## 2017-06-19 ENCOUNTER — Other Ambulatory Visit (INDEPENDENT_AMBULATORY_CARE_PROVIDER_SITE_OTHER): Payer: Medicare Other

## 2017-06-19 ENCOUNTER — Encounter: Payer: Self-pay | Admitting: Nurse Practitioner

## 2017-06-19 VITALS — BP 110/46 | HR 70 | Temp 98.2°F | Ht 64.0 in | Wt 120.0 lb

## 2017-06-19 DIAGNOSIS — N3001 Acute cystitis with hematuria: Secondary | ICD-10-CM

## 2017-06-19 LAB — URINALYSIS WITH CULTURE, IF INDICATED
Bilirubin Urine: NEGATIVE
HGB URINE DIPSTICK: NEGATIVE
Ketones, ur: NEGATIVE
Leukocytes, UA: NEGATIVE
NITRITE: NEGATIVE
RBC / HPF: NONE SEEN (ref 0–?)
Specific Gravity, Urine: 1.02 (ref 1.000–1.030)
Total Protein, Urine: 30 — AB
Urine Glucose: NEGATIVE
Urobilinogen, UA: 0.2 (ref 0.0–1.0)
pH: 6 (ref 5.0–8.0)

## 2017-06-19 NOTE — Progress Notes (Signed)
Subjective:  Patient ID: Paula Ward, female    DOB: 11-Nov-1935  Age: 81 y.o. MRN: [id number]  CC: Follow-up (2 wk follow up---kidney follow up--weakness)   HPI [name] presents for repeat urinalysis to re eval UTI. She denies any urinary symptoms today. No fver, no malaise or worsening fatigue.  Outpatient Medications Prior to Visit  Medication Sig Dispense Refill  . acetaminophen (TYLENOL) 325 MG tablet Take 650 mg by mouth every 6 (six) hours as needed for mild pain.     Marland Kitchen albuterol (PROVENTIL HFA;VENTOLIN HFA) 108 (90 BASE) MCG/ACT inhaler Inhale 2 puffs into the lungs every 6 (six) hours as needed for wheezing or shortness of breath. 1 Inhaler 3  . ALPRAZolam (XANAX) 0.25 MG tablet Take 0.5 tablets (0.125 mg total) by mouth at bedtime as needed. 30 tablet 3  . Cyanocobalamin (VITAMIN B-12 PO) Take 1 tablet by mouth daily.     Marland Kitchen diltiazem (CARDIZEM CD) 120 MG 24 hr capsule Take 120 mg by mouth daily.      . fluticasone (FLONASE) 50 MCG/ACT nasal spray instill 1 spray into each nostril twice a day if needed 48 g 3  . loratadine (CLARITIN) 10 MG tablet Take 10 mg by mouth daily as needed for allergies.     Marland Kitchen losartan (COZAAR) 25 MG tablet Take 25 mg by mouth daily as needed (Pt is instructed by doctor to take based on kidney lab work).     Marland Kitchen omeprazole (PRILOSEC) 40 MG capsule Take 1 capsule (40 mg total) by mouth 2 (two) times daily. 60 capsule 5  . OPSUMIT 10 MG TABS Take 1 tablet by mouth daily.     . phenazopyridine (PYRIDIUM) 100 MG tablet Take 1 tablet (100 mg total) by mouth 3 (three) times daily as needed for pain (with food). 10 tablet 0  . Pyridoxine HCl (VITAMIN B6 PO) Take 1 tablet by mouth daily.     Marland Kitchen triamcinolone cream (KENALOG) 0.1 % Apply 1 application topically daily as needed (Irritated skin).     . ciprofloxacin (CIPRO) 250 MG tablet Take 1 tablet (250 mg total) by mouth 2 (two) times daily. (Patient not taking: Reported on 06/19/2017) 14 tablet 0   No  facility-administered medications prior to visit.     ROS See HPI  Objective:  BP (!) 110/46   Pulse 70   Temp 98.2 F (36.8 C)   Ht _0  (1.626 m)   Wt 120 lb (54.4 kg)   SpO2 98%   BMI 20.60 kg/m   BP Readings from Last 3 Encounters:  06/19/17 (!) 110/46  06/05/17 (!) 110/52  05/18/17 (!) 110/46    Wt Readings from Last 3 Encounters:  06/19/17 120 lb (54.4 kg)  06/05/17 120 lb (54.4 kg)  05/18/17 119 lb (54 kg)    Physical Exam  Constitutional: She is oriented to person, place, and time. No distress.  Cardiovascular: Normal rate.   Pulmonary/Chest: Effort normal.  Neurological: She is alert and oriented to person, place, and time.  Skin: Skin is warm and dry.  Vitals reviewed.   Lab Results  Component Value Date   WBC 6.2 06/16/2017   HGB 10.6 (L) 06/16/2017   HCT 31.9 (L) 06/16/2017   PLT 219.0 06/16/2017   GLUCOSE 115 (H) 06/16/2017   CHOL 199 02/11/2017   TRIG 115.0 02/11/2017   HDL 60.70 02/11/2017   LDLCALC 115 (H) 02/11/2017   ALT 9 05/13/2017   AST 20 05/13/2017   NA  138 06/16/2017   K 4.5 06/16/2017   CL 103 06/16/2017   CREATININE 1.43 (H) 06/16/2017   BUN 28 (H) 06/16/2017   CO2 29 06/16/2017   TSH 3.83 01/30/2014   INR 1.11 04/03/2011    Dg Chest 2 View  Result Date: 09/17/2016 CLINICAL DATA:  Ten days of substernal chest pain. History of scleroderma, pulmonary hypertension, previous pulmonary embolism, interstitial lung disease EXAM: CHEST  2 VIEW COMPARISON:  Chest x-ray of August 19, 2016 FINDINGS: The lungs are well-expanded. The interstitial markings are coarse though stable. There is no alveolar infiltrate or pleural effusion. The cardiac silhouette remains enlarged. The pulmonary vascularity is not engorged. There is calcification in the wall of the aortic arch. The patient has undergone previous breast surgery for malignancy. The observed bony thorax exhibits no acute abnormalities. IMPRESSION: Stable cardiomegaly without  pulmonary vascular congestion. Thoracic aortic atherosclerosis. Chronic interstitial changes throughout both lungs. Stable scarring at the left lung base. Electronically Signed   By: David  Martinique M.D.   On: 09/17/2016 14:34    Assessment & Plan:   Waneda was seen today for follow-up.  Diagnoses and all orders for this visit:  Acute cystitis with hematuria -     Urinalysis with Culture, if indicated; Future   I am having [name] maintain her diltiazem, loratadine, acetaminophen, losartan, OPSUMIT, albuterol, Pyridoxine HCl (VITAMIN B6 PO), Cyanocobalamin (VITAMIN B-12 PO), triamcinolone cream, omeprazole, ALPRAZolam, fluticasone, phenazopyridine, and ciprofloxacin.  No orders of the defined types were placed in this encounter.   Follow-up: No Follow-up on file.  Wilfred Lacy, NP

## 2017-06-26 ENCOUNTER — Other Ambulatory Visit: Payer: Self-pay | Admitting: Gastroenterology

## 2017-07-16 ENCOUNTER — Other Ambulatory Visit (INDEPENDENT_AMBULATORY_CARE_PROVIDER_SITE_OTHER): Payer: Medicare Other

## 2017-07-16 DIAGNOSIS — I272 Pulmonary hypertension, unspecified: Secondary | ICD-10-CM | POA: Diagnosis not present

## 2017-07-16 LAB — CBC
HCT: 33.5 % — ABNORMAL LOW (ref 36.0–46.0)
Hemoglobin: 11 g/dL — ABNORMAL LOW (ref 12.0–15.0)
MCHC: 33 g/dL (ref 30.0–36.0)
MCV: 105.1 fl — ABNORMAL HIGH (ref 78.0–100.0)
Platelets: 209 K/uL (ref 150.0–400.0)
RBC: 3.19 Mil/uL — ABNORMAL LOW (ref 3.87–5.11)
RDW: 12.7 % (ref 11.5–15.5)
WBC: 7.4 K/uL (ref 4.0–10.5)

## 2017-07-16 LAB — COMPREHENSIVE METABOLIC PANEL WITH GFR
ALT: 10 U/L (ref 0–35)
AST: 20 U/L (ref 0–37)
Albumin: 4.3 g/dL (ref 3.5–5.2)
Alkaline Phosphatase: 46 U/L (ref 39–117)
BUN: 24 mg/dL — ABNORMAL HIGH (ref 6–23)
CO2: 25 meq/L (ref 19–32)
Calcium: 9.4 mg/dL (ref 8.4–10.5)
Chloride: 103 meq/L (ref 96–112)
Creatinine, Ser: 1.34 mg/dL — ABNORMAL HIGH (ref 0.40–1.20)
GFR: 40.56 mL/min — ABNORMAL LOW
Glucose, Bld: 118 mg/dL — ABNORMAL HIGH (ref 70–99)
Potassium: 4.2 meq/L (ref 3.5–5.1)
Sodium: 137 meq/L (ref 135–145)
Total Bilirubin: 0.5 mg/dL (ref 0.2–1.2)
Total Protein: 6.8 g/dL (ref 6.0–8.3)

## 2017-08-14 ENCOUNTER — Other Ambulatory Visit (INDEPENDENT_AMBULATORY_CARE_PROVIDER_SITE_OTHER): Payer: Medicare Other

## 2017-08-14 DIAGNOSIS — I272 Pulmonary hypertension, unspecified: Secondary | ICD-10-CM | POA: Diagnosis not present

## 2017-08-14 LAB — CBC
HCT: 34.2 % — ABNORMAL LOW (ref 36.0–46.0)
Hemoglobin: 11.3 g/dL — ABNORMAL LOW (ref 12.0–15.0)
MCHC: 33.2 g/dL (ref 30.0–36.0)
MCV: 104.7 fl — AB (ref 78.0–100.0)
PLATELETS: 204 10*3/uL (ref 150.0–400.0)
RBC: 3.27 Mil/uL — AB (ref 3.87–5.11)
RDW: 12.6 % (ref 11.5–15.5)
WBC: 6.3 10*3/uL (ref 4.0–10.5)

## 2017-08-14 LAB — COMPREHENSIVE METABOLIC PANEL
ALBUMIN: 4.2 g/dL (ref 3.5–5.2)
ALT: 10 U/L (ref 0–35)
AST: 20 U/L (ref 0–37)
Alkaline Phosphatase: 44 U/L (ref 39–117)
BILIRUBIN TOTAL: 0.5 mg/dL (ref 0.2–1.2)
BUN: 27 mg/dL — AB (ref 6–23)
CHLORIDE: 101 meq/L (ref 96–112)
CO2: 25 meq/L (ref 19–32)
CREATININE: 1.26 mg/dL — AB (ref 0.40–1.20)
Calcium: 9.5 mg/dL (ref 8.4–10.5)
GFR: 43.54 mL/min — ABNORMAL LOW (ref >60.00)
Glucose, Bld: 94 mg/dL (ref 70–99)
Potassium: 3.8 mEq/L (ref 3.5–5.1)
SODIUM: 137 meq/L (ref 135–145)
Total Protein: 6.7 g/dL (ref 6.0–8.3)

## 2017-09-03 ENCOUNTER — Ambulatory Visit: Payer: Medicare Other | Admitting: Internal Medicine

## 2017-09-03 ENCOUNTER — Encounter: Payer: Self-pay | Admitting: Internal Medicine

## 2017-09-03 VITALS — BP 116/58 | HR 77 | Temp 97.6°F | Ht 64.0 in | Wt 119.0 lb

## 2017-09-03 DIAGNOSIS — R300 Dysuria: Secondary | ICD-10-CM | POA: Diagnosis not present

## 2017-09-03 LAB — POCT URINALYSIS DIPSTICK
Bilirubin, UA: NEGATIVE
GLUCOSE UA: NEGATIVE
Ketones, UA: 5
Leukocytes, UA: NEGATIVE
NITRITE UA: NEGATIVE
PH UA: 5.5 (ref 5.0–8.0)
PROTEIN UA: 30
RBC UA: NEGATIVE
Spec Grav, UA: 1.02 (ref 1.010–1.025)
UROBILINOGEN UA: 0.2 U/dL

## 2017-09-03 LAB — HM MAMMOGRAPHY

## 2017-09-03 NOTE — Progress Notes (Signed)
   Subjective:    Patient ID: Paula Ward, female    DOB: 02-08-1936, 81 y.o.   MRN: [id number]  HPI The patient is a 81 YO female coming in for follow up of UTI. [facility] of nitrofurantoin. At that time she was having burning and back pain. The burning is gone now but still having some back pain. Wants to know if it is gone. Denies fevers or chills. No nausea or vomiting.  Review of Systems  Constitutional: Negative.   HENT: Negative.   Eyes: Negative.   Respiratory: Negative for cough, chest tightness and shortness of breath.   Cardiovascular: Negative for chest pain, palpitations and leg swelling.  Gastrointestinal: Negative for abdominal distention, abdominal pain, constipation, diarrhea, nausea and vomiting.  Musculoskeletal: Positive for back pain.  Skin: Negative.   Neurological: Negative.   Psychiatric/Behavioral: Negative.       Objective:   Physical Exam  Constitutional: She is oriented to person, place, and time. She appears well-developed and well-nourished.  HENT:  Head: Normocephalic and atraumatic.  Eyes: EOM are normal.  Neck: Normal range of motion.  Cardiovascular: Normal rate and regular rhythm.  Pulmonary/Chest: Effort normal and breath sounds normal. No respiratory distress. She has no wheezes. She has no rales.  Abdominal: Soft. Bowel sounds are normal. She exhibits no distension. There is no tenderness. There is no rebound.  Musculoskeletal: She exhibits no edema.  Neurological: She is alert and oriented to person, place, and time. Coordination normal.  Skin: Skin is warm and dry.  Psychiatric: She has a normal mood and affect.   Vitals:   09/03/17 1452  BP: (!) 116/58  Pulse: 77  Temp: 97.6 F (36.4 C)  TempSrc: Oral  SpO2: 98%  Weight: 119 lb (54 kg)  Height: _0  (1.626 m)      Assessment & Plan:

## 2017-09-03 NOTE — Patient Instructions (Signed)
It does not look like you have an infection but we will check the culture.   Drink plenty of fluids to help with the uncomfort. Green tea is okay.

## 2017-09-04 ENCOUNTER — Telehealth: Payer: Self-pay

## 2017-09-04 ENCOUNTER — Other Ambulatory Visit: Payer: Medicare Other

## 2017-09-04 DIAGNOSIS — R300 Dysuria: Secondary | ICD-10-CM | POA: Insufficient documentation

## 2017-09-04 NOTE — Telephone Encounter (Signed)
Contacted patient and informed her I forgot to send her urine down for culture. Patient stated understanding and will come back today to lab to give another urine sample

## 2017-09-04 NOTE — Assessment & Plan Note (Signed)
UA without signs of infection.  Urine culture ordered.  Advised to increase fluid intake.

## 2017-09-08 ENCOUNTER — Encounter: Payer: Self-pay | Admitting: Internal Medicine

## 2017-09-09 LAB — URINE CULTURE
MICRO NUMBER: 81295669
SPECIMEN QUALITY:: ADEQUATE

## 2017-09-15 ENCOUNTER — Other Ambulatory Visit (INDEPENDENT_AMBULATORY_CARE_PROVIDER_SITE_OTHER): Payer: Medicare Other

## 2017-09-15 DIAGNOSIS — I272 Pulmonary hypertension, unspecified: Secondary | ICD-10-CM | POA: Diagnosis not present

## 2017-09-15 LAB — COMPREHENSIVE METABOLIC PANEL
ALT: 11 U/L (ref 0–35)
AST: 21 U/L (ref 0–37)
Albumin: 4.2 g/dL (ref 3.5–5.2)
Alkaline Phosphatase: 56 U/L (ref 39–117)
BUN: 26 mg/dL — ABNORMAL HIGH (ref 6–23)
CALCIUM: 9.5 mg/dL (ref 8.4–10.5)
CO2: 26 meq/L (ref 19–32)
Chloride: 104 mEq/L (ref 96–112)
Creatinine, Ser: 1.27 mg/dL — ABNORMAL HIGH (ref 0.40–1.20)
GFR: 43.14 mL/min — AB (ref >60.00)
Glucose, Bld: 119 mg/dL — ABNORMAL HIGH (ref 70–99)
Potassium: 4.2 mEq/L (ref 3.5–5.1)
Sodium: 138 mEq/L (ref 135–145)
Total Bilirubin: 0.5 mg/dL (ref 0.2–1.2)
Total Protein: 6.9 g/dL (ref 6.0–8.3)

## 2017-09-15 LAB — CBC
HEMATOCRIT: 33.3 % — AB (ref 36.0–46.0)
HEMOGLOBIN: 10.9 g/dL — AB (ref 12.0–15.0)
MCHC: 32.6 g/dL (ref 30.0–36.0)
MCV: 105.8 fl — ABNORMAL HIGH (ref 78.0–100.0)
PLATELETS: 208 10*3/uL (ref 150.0–400.0)
RBC: 3.15 Mil/uL — AB (ref 3.87–5.11)
RDW: 13 % (ref 11.5–15.5)
WBC: 6.9 10*3/uL (ref 4.0–10.5)

## 2017-10-15 ENCOUNTER — Other Ambulatory Visit (INDEPENDENT_AMBULATORY_CARE_PROVIDER_SITE_OTHER): Payer: Medicare Other

## 2017-10-15 DIAGNOSIS — I272 Pulmonary hypertension, unspecified: Secondary | ICD-10-CM | POA: Diagnosis not present

## 2017-10-15 LAB — CBC
HCT: 32.9 % — ABNORMAL LOW (ref 36.0–46.0)
Hemoglobin: 10.9 g/dL — ABNORMAL LOW (ref 12.0–15.0)
MCHC: 33.1 g/dL (ref 30.0–36.0)
MCV: 105.1 fl — ABNORMAL HIGH (ref 78.0–100.0)
Platelets: 197 10*3/uL (ref 150.0–400.0)
RBC: 3.13 Mil/uL — ABNORMAL LOW (ref 3.87–5.11)
RDW: 12.9 % (ref 11.5–15.5)
WBC: 6.4 10*3/uL (ref 4.0–10.5)

## 2017-10-15 LAB — COMPREHENSIVE METABOLIC PANEL
ALK PHOS: 45 U/L (ref 39–117)
ALT: 11 U/L (ref 0–35)
AST: 21 U/L (ref 0–37)
Albumin: 4.3 g/dL (ref 3.5–5.2)
BILIRUBIN TOTAL: 0.6 mg/dL (ref 0.2–1.2)
BUN: 28 mg/dL — ABNORMAL HIGH (ref 6–23)
CALCIUM: 9.2 mg/dL (ref 8.4–10.5)
CO2: 26 mEq/L (ref 19–32)
CREATININE: 1.16 mg/dL (ref 0.40–1.20)
Chloride: 104 mEq/L (ref 96–112)
GFR: 47.88 mL/min — AB (ref >60.00)
GLUCOSE: 125 mg/dL — AB (ref 70–99)
Potassium: 4.4 mEq/L (ref 3.5–5.1)
Sodium: 137 mEq/L (ref 135–145)
TOTAL PROTEIN: 6.9 g/dL (ref 6.0–8.3)

## 2017-10-26 ENCOUNTER — Ambulatory Visit: Payer: Medicare Other | Admitting: Internal Medicine

## 2017-10-26 ENCOUNTER — Encounter: Payer: Self-pay | Admitting: Internal Medicine

## 2017-10-26 VITALS — BP 140/58 | HR 92 | Temp 97.6°F | Resp 16 | Wt 114.0 lb

## 2017-10-26 DIAGNOSIS — R42 Dizziness and giddiness: Secondary | ICD-10-CM

## 2017-10-26 DIAGNOSIS — G3281 Cerebellar 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: Secondary | ICD-10-CM | POA: Diagnosis not present

## 2017-10-26 MED ORDER — MECLIZINE HCL 12.5 MG PO TABS: 12.5000 mg | ORAL_TABLET | Freq: Three times a day (TID) | ORAL | 1 refills | Status: DC | PRN

## 2017-10-26 NOTE — Patient Instructions (Signed)
Take the meclizine as needed for dizziness - you can take this 3 times a day as needed.   Call if no improvement    Dizziness Dizziness is a common problem. It is a feeling of unsteadiness or light-headedness. You may feel like you are about to faint. Dizziness can lead to injury if you stumble or fall. Anyone can become dizzy, but dizziness is more common in older adults. This condition can be caused by a number of things, including medicines, dehydration, or illness. Follow these instructions at home: Eating and drinking  Drink enough fluid to keep your urine clear or pale yellow. This helps to keep you from becoming dehydrated. Try to drink more clear fluids, such as water.  Do not drink alcohol.  Limit your caffeine intake if told to do so by your health care provider. Check ingredients and nutrition facts to see if a food or beverage contains caffeine.  Limit your salt (sodium) intake if told to do so by your health care provider. Check ingredients and nutrition facts to see if a food or beverage contains sodium. Activity  Avoid making quick movements. ? Rise slowly from chairs and steady yourself until you feel okay. ? In the morning, first sit up on the side of the bed. When you feel okay, stand slowly while you hold onto something until you know that your balance is fine.  If you need to stand in one place for a long time, move your legs often. Tighten and relax the muscles in your legs while you are standing.  Do not drive or use heavy machinery if you feel dizzy.  Avoid bending down if you feel dizzy. Place items in your home so that they are easy for you to reach without leaning over. Lifestyle  Do not use any products that contain nicotine or tobacco, such as cigarettes and e-cigarettes. If you need help quitting, ask your health care provider.  Try to reduce your stress level by using methods such as yoga or meditation. Talk with your health care provider if you need help  to manage your stress. General instructions  Watch your dizziness for any changes.  Take over-the-counter and prescription medicines only as told by your health care provider. Talk with your health care provider if you think that your dizziness is caused by a medicine that you are taking.  Tell a friend or a family member that you are feeling dizzy. If he or she notices any changes in your behavior, have this person call your health care provider.  Keep all follow-up visits as told by your health care provider. This is important. Contact a health care provider if:  Your dizziness does not go away.  Your dizziness or light-headedness gets worse.  You feel nauseous.  You have reduced hearing.  You have new symptoms.  You are unsteady on your feet or you feel like the room is spinning. Get help right away if:  You vomit or have diarrhea and are unable to eat or drink anything.  You have problems talking, walking, swallowing, or using your arms, hands, or legs.  You feel generally weak.  You are not thinking clearly or you have trouble forming sentences. It may take a friend or family member to notice this.  You have chest pain, abdominal pain, shortness of breath, or sweating.  Your vision changes.  You have any bleeding.  You have a severe headache.  You have neck pain or a stiff neck.  You have  a fever. These symptoms may represent a serious problem that is an emergency. Do not wait to see if the symptoms will go away. Get medical help right away. Call your local emergency services (911 in the [place] Do not drive yourself to the hospital. Summary  Dizziness is a feeling of unsteadiness or light-headedness. This condition can be caused by a number of things, including medicines, dehydration, or illness.  Anyone can become dizzy, but dizziness is more common in older adults.  Drink enough fluid to keep your urine clear or pale yellow. Do not drink alcohol.  Avoid  making quick movements if you feel dizzy. Monitor your dizziness for any changes. This information is not intended to replace advice given to you by your health care provider. Make sure you discuss any questions you have with your health care provider. Document Released: 04/01/2001 Document Revised: 11/08/2016 Document Reviewed: 11/08/2016 Elsevier Interactive Patient Education  [employer].

## 2017-10-26 NOTE — Assessment & Plan Note (Signed)
Onset of gait abnormality x 1 week ago, then developed dizziness Some symptoms consistent with BPPV, but concern over possible CVA in cerebellum Will get MRI Treat dizziness

## 2017-10-26 NOTE — Progress Notes (Signed)
Subjective:    Patient ID: Paula Ward, female    DOB: 1936-08-12, 82 y.o.   MRN: [id number]  HPI She is here for an acute visit.   Dizziness, gait abnormality: It started one week ago - she started feeling off balance and would veer off the right.  Her symptoms got worse yesterday with true dizziness or spinning sensation associated with nausea/vomiting.  The dizziness was worse with head movements.  This is her first episode.  Her ears feel full of fluid for about one week.   She started taking mucinex.  She is using her neti pot. She denies other cold symptoms and has not had any fever or chills.   Medications and allergies reviewed with patient and updated if appropriate.  Patient Active Problem List   Diagnosis Date Noted  . Dysuria 09/04/2017  . Routine general medical examination at a health care facility 01/30/2017  . Pericardial effusion 01/21/2016  . 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HCC)   . Osteoarthritis 02/25/2015  . Mass of left side of neck   . CKD (chronic kidney disease) stage 3, GFR 30-59 ml/min (HCC) 09/01/2012  . RECTAL INCONTINENCE 03/27/2009  . Angiodysplasia of stomach and duodenum 08/22/2008  . ADENOCARCINOMA, BREAST, LEFT 07/26/2007  . Anemia, iron deficiency 07/26/2007  . Essential hypertension 07/26/2007  . Pulmonary hypertension ([place]) 07/26/2007  . GASTROESOPHAGEAL REFLUX DISEASE 07/26/2007  . SCLERODERMA 07/26/2007  . PULMONARY EMBOLISM, HX OF 07/26/2007    Current Outpatient Medications on File Prior to Visit  Medication Sig Dispense Refill  . acetaminophen (TYLENOL) 325 MG tablet Take 650 mg by mouth every 6 (six) hours as needed for mild pain.     Marland Kitchen albuterol (PROVENTIL HFA;VENTOLIN HFA) 108 (90 BASE) MCG/ACT inhaler Inhale 2 puffs into the lungs every 6 (six) hours as needed for wheezing or shortness of breath. 1 Inhaler 3  . ALPRAZolam (XANAX) 0.25 MG tablet Take 0.5 tablets (0.125 mg total) by mouth at bedtime as needed. 30 tablet 3  .  Cyanocobalamin (VITAMIN B-12 PO) Take 1 tablet by mouth daily.     Marland Kitchen diltiazem (CARDIZEM CD) 120 MG 24 hr capsule Take 120 mg by mouth daily.      . fluticasone (FLONASE) 50 MCG/ACT nasal spray instill 1 spray into each nostril twice a day if needed 48 g 3  . loratadine (CLARITIN) 10 MG tablet Take 10 mg by mouth daily as needed for allergies.     Marland Kitchen losartan (COZAAR) 25 MG tablet Take 25 mg by mouth daily as needed (Pt is instructed by doctor to take based on kidney lab work).     Marland Kitchen omeprazole (PRILOSEC) 40 MG capsule take 1 tablet by mouth twice a day 60 capsule 5  . OPSUMIT 10 MG TABS Take 1 tablet by mouth daily.     . phenazopyridine (PYRIDIUM) 100 MG tablet Take 1 tablet (100 mg total) by mouth 3 (three) times daily as needed for pain (with food). 10 tablet 0  . Pyridoxine HCl (VITAMIN B6 PO) Take 1 tablet by mouth daily.     Marland Kitchen triamcinolone cream (KENALOG) 0.1 % Apply 1 application topically daily as needed (Irritated skin).      No current facility-administered medications on file prior to visit.     Past Medical History:  Diagnosis Date  . Anemia   . Angiodysplasia of stomach and duodenum   . Arthus phenomenon   . AVM (arteriovenous malformation)   . Blood transfusion without reported diagnosis  last 4 units 12-22-15, Iron infusion x2 last -01-07-16,01-14-16.  . Breast cancer ([place]) 1989   Left  . Candida esophagitis ([place])   . Cataract   . Chronic kidney disease    Chronic mild renal insuffiency  . CREST syndrome ([place])   . GERD (gastroesophageal reflux disease)    w/ HH  . Interstitial lung disease ([place])   . Nodule of kidney   . PONV (postoperative nausea and vomiting)   . Pulmonary embolus ([place]) 2003  . Pulmonary hypertension ([place])    followed by Dr Gaynell Face at [facility], now Dr. Maryjean Ka visit end 2'17.  . Rectal incontinence   . Renal cell carcinoma ([place])   . Scleroderma ([place])   . Tubular adenoma of colon   . Uterine polyp     Past Surgical History:  Procedure  Laterality Date  . APPENDECTOMY  1962  . BREAST LUMPECTOMY  1989   left  . CATARACT EXTRACTION, BILATERAL Bilateral 12/2013  . ENTEROSCOPY N/A 01/18/2016   Procedure: ENTEROSCOPY;  Surgeon: Mauri Pole, MD;  Location: [facility];  Service: Endoscopy;  Laterality: N/A;  . ESOPHAGOGASTRODUODENOSCOPY (EGD) WITH PROPOFOL N/A 12/21/2015   Procedure: ESOPHAGOGASTRODUODENOSCOPY (EGD) WITH PROPOFOL;  Surgeon: Irene Shipper, MD;  Location: [facility];  Service: Endoscopy;  Laterality: N/A;  . IR GENERIC HISTORICAL  06/05/2016   IR RADIOLOGIST EVAL & MGMT 06/05/2016 Aletta Edouard, MD [facility] INTERV RAD  . IVC Filter    . KIDNEY SURGERY     left -"laser surgery by Dr. Kathlene Cote- 5 yrs ago" no removal  . TONSILLECTOMY    . TUBAL LIGATION      Social History   Socioeconomic History  . Marital status: Married    Spouse name: None  . Number of children: 2  . Years of education: None  . Highest education level: None  Social Needs  . Financial resource strain: None  . Food insecurity - worry: None  . Food insecurity - inability: None  . Transportation needs - medical: None  . Transportation needs - non-medical: None  Occupational History  . Occupation: Retired  Tobacco Use  . Smoking status: Never Smoker  . Smokeless tobacco: Never Used  Substance and Sexual Activity  . Alcohol use: No  . Drug use: No  . Sexual activity: No  Other Topics Concern  . None  Social History Narrative   Married '61   1 son- '65, 1 daughter '63; 6 children (2 adopted)   SO- SOB   Retirement- doing well   Marriage in good health   Patient has never smoked   Alcohol use- no   Pt gets regular exercise          Family History  Problem Relation Age of Onset  . Bladder Cancer Father   . Diabetes Father   . Prostate cancer Father   . Alzheimer's disease Mother   . Lung cancer Sister   . Diabetes Sister   . Lung cancer Sister         smoker  . Esophageal cancer Paternal Uncle   . Colon cancer  Neg Hx     Review of Systems  Constitutional: Negative for chills and fever.  HENT: Positive for postnasal drip. Negative for congestion, ear pain (pressure in ears - feel full of fluid), sinus pressure, sinus pain and sore throat.   Respiratory: Negative for cough, shortness of breath and wheezing.   Gastrointestinal: Positive for nausea (yesterday) and vomiting.  Musculoskeletal: Positive for gait problem.  Neurological: Positive for dizziness. Negative for weakness, light-headedness, numbness and headaches.       Objective:   Vitals:   10/26/17 1025  BP: (!) 140/58  Pulse: 92  Resp: 16  Temp: 97.6 F (36.4 C)  SpO2: 95%   Wt Readings from Last 3 Encounters:  10/26/17 114 lb (51.7 kg)  09/03/17 119 lb (54 kg)  06/19/17 120 lb (54.4 kg)   Body mass index is 19.57 kg/m.   Physical Exam    Constitutional: Appears well-developed and well-nourished. No distress.  HENT:  Head: Normocephalic and atraumatic.  Neck: b/l ear canals and TM normal.  No oropharynx erythema.  Neck supple. No tracheal deviation present. No thyromegaly present.  No cervical lymphadenopathy Cardiovascular: Normal rate, regular rhythm and normal heart sounds.   2/6 systolic murmur heard. No carotid bruit .  No edema Pulmonary/Chest: Effort normal. No respiratory distress. No has no wheezes. Bibasilar crackles Neuro:  Normal sensation and strength in all extremities, CN II-XII intact, gait abnormal - off balanced Skin: Skin is warm and dry. Not diaphoretic.  Psychiatric: Normal mood and affect. Behavior is normal.      Assessment & Plan:    See Problem List for Assessment and Plan of chronic medical problems.

## 2017-10-26 NOTE — Assessment & Plan Note (Signed)
MRI ordered to r/o CVA Possibly BPPV Meclizine 12.5-25 mg TID PRN Discussed possible side effects Discussed possible PT in near future if no improvement Continue net pot, mucinex, add flonase   Call if no improvement

## 2017-10-27 ENCOUNTER — Ambulatory Visit (HOSPITAL_COMMUNITY): Admission: RE | Admit: 2017-10-27 | Payer: Medicare Other | Source: Ambulatory Visit

## 2017-10-29 ENCOUNTER — Ambulatory Visit (HOSPITAL_COMMUNITY)
Admission: RE | Admit: 2017-10-29 | Discharge: 2017-10-29 | Disposition: A | Discharge status: Home / Self Care | Payer: Medicare Other | Source: Ambulatory Visit | Attending: Internal Medicine | Admitting: Internal Medicine

## 2017-10-29 DIAGNOSIS — G3281 Cerebellar 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: Secondary | ICD-10-CM

## 2017-10-29 DIAGNOSIS — G319 Degenerative disease of nervous system, unspecified: Secondary | ICD-10-CM | POA: Insufficient documentation

## 2017-10-29 DIAGNOSIS — I6782 Cerebral ischemia: Secondary | ICD-10-CM | POA: Insufficient documentation

## 2017-10-29 DIAGNOSIS — Z8673 Personal history of transient ischemic attack (TIA), and cerebral infarction without residual deficits: Secondary | ICD-10-CM | POA: Insufficient documentation

## 2017-10-30 ENCOUNTER — Encounter: Payer: Self-pay | Admitting: Emergency Medicine

## 2017-11-05 ENCOUNTER — Encounter: Payer: Self-pay | Admitting: Internal Medicine

## 2017-11-05 ENCOUNTER — Ambulatory Visit: Payer: Medicare Other | Admitting: Internal Medicine

## 2017-11-05 VITALS — BP 120/58 | HR 99 | Temp 98.5°F | Ht 64.0 in | Wt 113.0 lb

## 2017-11-05 DIAGNOSIS — R05 Cough: Secondary | ICD-10-CM | POA: Diagnosis not present

## 2017-11-05 DIAGNOSIS — J309 Allergic rhinitis, unspecified: Secondary | ICD-10-CM | POA: Insufficient documentation

## 2017-11-05 DIAGNOSIS — J3089 Other allergic rhinitis: Secondary | ICD-10-CM

## 2017-11-05 DIAGNOSIS — R059 Cough, unspecified: Secondary | ICD-10-CM

## 2017-11-05 MED ORDER — METHYLPREDNISOLONE ACETATE 80 MG/ML IJ SUSP
80.0000 mg | Freq: Once | INTRAMUSCULAR | Status: AC
  Administered 2017-11-05: 80 mg via INTRAMUSCULAR

## 2017-11-05 MED ORDER — FLUTICASONE PROPIONATE 50 MCG/ACT NA SUSP: NASAL | 3 refills | Status: DC

## 2017-11-05 MED ORDER — BENZONATATE 200 MG PO CAPS: 200.0000 mg | ORAL_CAPSULE | Freq: Three times a day (TID) | ORAL | 0 refills | Status: DC | PRN

## 2017-11-05 NOTE — Assessment & Plan Note (Signed)
Depo-medrol 80 mg IM given at visit for congestion. She does not need antibiotics without sinus disease on MRI and no signs of infection on exam. Lungs are clear and she has follow up of her pulmonary hypertension scheduled soon.

## 2017-11-05 NOTE — Patient Instructions (Addendum)
We have given you the steroid injection today to help with the sinuses. If you are not feeling better in 4-5 days call us back.  We have sent in the refill of the flonase as well.

## 2017-11-05 NOTE — Progress Notes (Signed)
   Subjective:    Patient ID: Paula Ward, female    DOB: [birth year], 82 y.o.   MRN: [id number]  HPI The patient is a 82 YO female coming in for sinus problems ongoing for about 1 month. She was seen for dizziness about 1-2 weeks ago and MRI head without acute changes. She is having drainage and sinus pressure. MRI did not reveal sinus disease. She denies fevers or chills. Has been doing exercises for the dizziness and this is improving some. She denies cough much. Having ear pain. Denies sick contacts. No muscle aches. Decreased appetite and some weight loss in the last several months.   Review of Systems  Constitutional: Positive for activity change, appetite change and chills. Negative for fatigue, fever and unexpected weight change.  HENT: Positive for congestion, postnasal drip, rhinorrhea and sinus pressure. Negative for ear discharge, ear pain, sinus pain, sneezing, sore throat, tinnitus, trouble swallowing and voice change.   Eyes: Negative.   Respiratory: Positive for cough. Negative for chest tightness, shortness of breath and wheezing.   Cardiovascular: Negative.   Gastrointestinal: Negative.   Musculoskeletal: Positive for myalgias.  Neurological: Negative.       Objective:   Physical Exam  Constitutional: She is oriented to person, place, and time. She appears well-developed and well-nourished.  HENT:  Head: Normocephalic and atraumatic.  Oropharynx with redness and clear drainage, nose with swollen turbinates, TMs normal bilaterally  Eyes: EOM are normal.  Neck: Normal range of motion. No thyromegaly present.  Cardiovascular: Normal rate and regular rhythm.  Pulmonary/Chest: Effort normal and breath sounds normal. No respiratory distress. She has no wheezes. She has no rales.  Abdominal: Soft.  Lymphadenopathy:    She has no cervical adenopathy.  Neurological: She is alert and oriented to person, place, and time.  Skin: Skin is warm and dry.   Vitals:   11/05/17  1056  BP: (!) 120/58  Pulse: 99  Temp: 98.5 F (36.9 C)  TempSrc: Oral  SpO2: 98%  Weight: 113 lb (51.3 kg)  Height: _0  (1.626 m)      Assessment & Plan:  Depo-medrol 80 mg IM given at visit

## 2017-11-08 ENCOUNTER — Encounter: Payer: Self-pay | Admitting: Internal Medicine

## 2017-11-10 ENCOUNTER — Encounter: Payer: Self-pay | Admitting: Internal Medicine

## 2017-11-11 MED ORDER — PROMETHAZINE-DM 6.25-15 MG/5ML PO SYRP: 5.0000 mL | ORAL_SOLUTION | Freq: Four times a day (QID) | ORAL | 0 refills | Status: DC | PRN

## 2017-12-14 ENCOUNTER — Other Ambulatory Visit (INDEPENDENT_AMBULATORY_CARE_PROVIDER_SITE_OTHER): Payer: Medicare Other

## 2017-12-14 DIAGNOSIS — I272 Pulmonary hypertension, unspecified: Secondary | ICD-10-CM | POA: Diagnosis not present

## 2017-12-14 LAB — COMPREHENSIVE METABOLIC PANEL
ALBUMIN: 4 g/dL (ref 3.5–5.2)
ALK PHOS: 48 U/L (ref 39–117)
ALT: 12 U/L (ref 0–35)
AST: 21 U/L (ref 0–37)
BUN: 29 mg/dL — AB (ref 6–23)
CO2: 28 mEq/L (ref 19–32)
Calcium: 9.7 mg/dL (ref 8.4–10.5)
Chloride: 100 mEq/L (ref 96–112)
Creatinine, Ser: 1.21 mg/dL — ABNORMAL HIGH (ref 0.40–1.20)
GFR: 45.58 mL/min — ABNORMAL LOW (ref >60.00)
GLUCOSE: 117 mg/dL — AB (ref 70–99)
POTASSIUM: 4.5 meq/L (ref 3.5–5.1)
SODIUM: 136 meq/L (ref 135–145)
TOTAL PROTEIN: 6.7 g/dL (ref 6.0–8.3)
Total Bilirubin: 0.5 mg/dL (ref 0.2–1.2)

## 2017-12-14 LAB — CBC
HCT: 33.3 % — ABNORMAL LOW (ref 36.0–46.0)
HEMOGLOBIN: 11.3 g/dL — AB (ref 12.0–15.0)
MCHC: 33.9 g/dL (ref 30.0–36.0)
MCV: 104.2 fl — ABNORMAL HIGH (ref 78.0–100.0)
Platelets: 208 10*3/uL (ref 150.0–400.0)
RBC: 3.2 Mil/uL — ABNORMAL LOW (ref 3.87–5.11)
RDW: 14 % (ref 11.5–15.5)
WBC: 7.6 10*3/uL (ref 4.0–10.5)

## 2018-01-13 ENCOUNTER — Other Ambulatory Visit (INDEPENDENT_AMBULATORY_CARE_PROVIDER_SITE_OTHER): Payer: Medicare Other

## 2018-01-13 DIAGNOSIS — I272 Pulmonary hypertension, unspecified: Secondary | ICD-10-CM | POA: Diagnosis not present

## 2018-01-13 LAB — COMPREHENSIVE METABOLIC PANEL
ALT: 12 U/L (ref 0–35)
AST: 22 U/L (ref 0–37)
Albumin: 3.9 g/dL (ref 3.5–5.2)
Alkaline Phosphatase: 55 U/L (ref 39–117)
BUN: 31 mg/dL — ABNORMAL HIGH (ref 6–23)
CO2: 28 meq/L (ref 19–32)
Calcium: 9.3 mg/dL (ref 8.4–10.5)
Chloride: 103 mEq/L (ref 96–112)
Creatinine, Ser: 1.41 mg/dL — ABNORMAL HIGH (ref 0.40–1.20)
GFR: 38.2 mL/min — AB (ref >60.00)
GLUCOSE: 112 mg/dL — AB (ref 70–99)
POTASSIUM: 4.8 meq/L (ref 3.5–5.1)
SODIUM: 139 meq/L (ref 135–145)
Total Bilirubin: 0.4 mg/dL (ref 0.2–1.2)
Total Protein: 6.7 g/dL (ref 6.0–8.3)

## 2018-01-13 LAB — CBC
HCT: 31 % — ABNORMAL LOW (ref 36.0–46.0)
HEMOGLOBIN: 10.5 g/dL — AB (ref 12.0–15.0)
MCHC: 33.9 g/dL (ref 30.0–36.0)
MCV: 103.9 fl — AB (ref 78.0–100.0)
PLATELETS: 207 10*3/uL (ref 150.0–400.0)
RBC: 2.99 Mil/uL — ABNORMAL LOW (ref 3.87–5.11)
RDW: 13.2 % (ref 11.5–15.5)
WBC: 6.9 10*3/uL (ref 4.0–10.5)

## 2018-01-19 ENCOUNTER — Ambulatory Visit: Payer: Medicare Other | Admitting: Nurse Practitioner

## 2018-01-19 ENCOUNTER — Encounter: Payer: Self-pay | Admitting: Nurse Practitioner

## 2018-01-19 VITALS — BP 100/60 | HR 80 | Ht 64.0 in | Wt 114.8 lb

## 2018-01-19 DIAGNOSIS — R05 Cough: Secondary | ICD-10-CM | POA: Diagnosis not present

## 2018-01-19 DIAGNOSIS — K219 Gastro-esophageal reflux disease without esophagitis: Secondary | ICD-10-CM

## 2018-01-19 NOTE — Progress Notes (Signed)
      IMPRESSION and PLAN:    #1. 82 yo female with pulmonary HTN / CREST syndrome presenting with chronic cough / gagging on phlegm in am.  [facility] increased PPI to BID the end of January with plans to get chest CT scan if no improvement.   -Patient clearly describes "coughing" , not regurgitation and she has no heartburn nor dysphagia. GI etiology would seem unlikely except that she does have hx of scleroderma which can lead to esophageal dysmotility. I'm going to discuss the case with Dr. Silverio Decamp who does our esophageal manometry and she is she feels it would be worthwhile to arrange for one.     #2. Stable large pericardial effusion followed at [facility].   HPI:    Chief Complaint: gagging   Patient is a 82 year old with CKD, hx of renal mass, s/p radiofrequency ablation in 2012,  and pulmonary HTN from limited scleroderma, she is followed at [facility]. She is known to Dr. Silverio Decamp, followed here for anemia and history of small bowel AVMs.  Vonnetta is here for evaluation of "gagging" possibly due to GERD. Coughing up "clumps" of mucous for a month, mainly in the am. No sinus drainage. She has frequent throat clearing. No dysphagia.  No chest pain or heartburn. No hemoptysis. On PPI, [facility] increased it to BID which helped but for only a week. No acute findings on CXR   Review of systems:    No chest pain but occasional tightness.  No SOB. No urinary sx. No fevers.    Past Medical History:  Diagnosis Date  . Anemia   . Angiodysplasia of stomach and duodenum   . Arthus phenomenon   . AVM (arteriovenous malformation)   . Blood transfusion without reported diagnosis    last 4 units 12-22-15, Iron infusion x2 last -01-07-16,01-14-16.  . Breast cancer ([place]) 1989   Left  . Candida esophagitis ([place])   . Cataract   . Chronic kidney disease    Chronic mild renal insuffiency  . CREST syndrome ([place])   . GERD (gastroesophageal reflux disease)    w/ HH  . Interstitial lung disease  ([place])   . Nodule of kidney   . PONV (postoperative nausea and vomiting)   . Pulmonary embolus ([place]) 2003  . Pulmonary hypertension ([place])    followed by Dr Gaynell Face at [facility], now Dr. Maryjean Ka visit end 2'17.  . Rectal incontinence   . Renal cell carcinoma ([place])   . Scleroderma ([place])   . Tubular adenoma of colon   . Uterine polyp     Patient's surgical history, family medical history, social history, medications and allergies were all reviewed in Epic    Physical Exam:     BP 100/60   Pulse 80   Ht _0  (1.626 m)   Wt 114 lb 12.8 oz (52.1 kg)   BMI 19.71 kg/m   GENERAL:  Well developed female in NAD PSYCH: :Pleasant, cooperative, normal affect EENT:  conjunctiva pink, mucous membranes moist, neck supple without masses CARDIAC:  RRR, , no peripheral edema PULM: Normal respiratory effort, lungs CTA bilaterally, no wheezing ABDOMEN:  Nondistended, soft, nontender. No obvious masses, no hepatomegaly,  normal bowel sounds SKIN:  turgor, no lesions seen Musculoskeletal:  Normal muscle tone, normal strength NEURO: Alert and oriented x 3, no focal neurologic deficits   Tye Savoy , NP 01/19/2018, 9:52 AM

## 2018-01-19 NOTE — Patient Instructions (Signed)
If you are age 80 or older, your body mass index should be between 23-30. Your Body mass index is 19.71 kg/m. If this is out of the aforementioned range listed, please consider follow up with your Primary Care Provider.  If you are age 38 or younger, your body mass index should be between 19-25. Your Body mass index is 19.71 kg/m. If this is out of the aformentioned range listed, please consider follow up with your Primary Care Provider.   We will call you.  Thank you for choosing [facility], Tye Savoy, NP

## 2018-01-21 ENCOUNTER — Encounter: Payer: Self-pay | Admitting: Nurse Practitioner

## 2018-01-25 NOTE — Progress Notes (Signed)
Don't think patient will tolerate the esophageal manometry with pH study. Continue conservative management and antireflux measures.  Reviewed and agree with documentation and assessment and plan. Damaris Hippo , MD

## 2018-01-27 ENCOUNTER — Encounter: Payer: Self-pay | Admitting: Nurse Practitioner

## 2018-02-01 ENCOUNTER — Telehealth: Payer: Self-pay

## 2018-02-01 ENCOUNTER — Ambulatory Visit (INDEPENDENT_AMBULATORY_CARE_PROVIDER_SITE_OTHER): Payer: Medicare Other | Admitting: *Deleted

## 2018-02-01 VITALS — BP 122/50 | HR 78 | Resp 18 | Ht 64.0 in | Wt 116.0 lb

## 2018-02-01 DIAGNOSIS — Z0000 Encounter for general adult medical examination without abnormal findings: Secondary | ICD-10-CM | POA: Diagnosis not present

## 2018-02-01 NOTE — Progress Notes (Signed)
Subjective:   Paula Ward is a 82 y.o. female who presents for Medicare Annual (Subsequent) preventive examination.  Review of Systems:  No ROS.  Medicare Wellness Visit. Additional risk factors are reflected in the social history.  Cardiac Risk Factors include: advanced age (>76mn, >>68women);hypertension Sleep patterns: feels rested on waking, gets up 1 times nightly to void and sleeps 8-9 hours nightly.    Home Safety/Smoke Alarms: Feels safe in home. Smoke alarms in place.  Living environment; residence and Firearm Safety: 1-story house/ trailer, no firearms. Lives with hsband, no needs for DME, good support system Seat Belt Safety/Bike Helmet: Wears seat belt.    Objective:     Vitals: BP (!) 122/50   Pulse 78   Resp 18   Ht _0  (1.626 m)   Wt 116 lb (52.6 kg)   SpO2 98%   BMI 19.91 kg/m   Body mass index is 19.91 kg/m.  Advanced Directives 02/01/2018 01/18/2016 12/24/2015 12/20/2015 12/20/2015 04/05/2012 06/04/2011  Does Patient Have a Medical Advance Directive? _1  Patient has advance directive, copy not in chart Patient has advance directive, copy not in chart  Type of Advance Directive [place] will [place] will Living will Living will - H[place] LakeLiving will -  Does patient want to make changes to medical advance directive? - - - No - Patient declined - - -  Copy of [place] Chart? No - copy requested - No - copy requested No - copy requested - Copy requested from family -  Pre-existing out of facility DNR order (yellow form or pink MOST form) - - - - - No -    Tobacco Social History   Tobacco Use  Smoking Status Never Smoker  Smokeless Tobacco Never Used     Counseling given: Not Answered    Past Medical History:  Diagnosis Date  . Anemia   . Angiodysplasia of stomach and duodenum   . Arthus phenomenon   . AVM (arteriovenous malformation)   .  Blood transfusion without reported diagnosis    last 4 units 12-22-15, Iron infusion x2 last -01-07-16,01-14-16.  . Breast cancer ([place] 1989   Left  . Candida esophagitis ([place]   . Cataract   . Chronic kidney disease    Chronic mild renal insuffiency  . CREST syndrome ([place]   . GERD (gastroesophageal reflux disease)    w/ HH  . Interstitial lung disease (HLoma [place]   . Nodule of kidney   . PONV (postoperative nausea and vomiting)   . Pulmonary embolus ([place] 2003  . Pulmonary hypertension (HB[place]    [facility]. R[facility]jean Kavisit end 2'17.  . Rectal incontinence   . Renal cell carcinoma (HSal[place]   . Scleroderma ([place]   . Tubular adenoma of colon   . Uterine polyp    Past Surgical History:  Procedure Laterality Date  . APPENDECTOMY  1962  . BREAST LUMPECTOMY  1989   left  . CATARACT EXTRACTION, BILATERAL Bilateral 12/2013  . ENTEROSCOPY N/A 01/18/2016   Procedure: ENTEROSCOPY;  Surgeon: KMauri Pole MD;  Location: [facility];  Service: Endoscopy;  Laterality: N/A;  . ESOPHAGOGASTRODUODENOSCOPY (EGD) WITH PROPOFOL N/A 12/21/2015   Procedure: ESOPHAGOGASTRODUODENOSCOPY (EGD) WITH PROPOFOL;  Surgeon: JIrene Shipper MD;  Location: [facility];  Service: Endoscopy;  Laterality: N/A;  . IR GENERIC HISTORICAL  06/05/2016   IR RADIOLOGIST EVAL &  MGMT 06/05/2016 Aletta Edouard, MD [facility] INTERV RAD  . IVC Filter    . KIDNEY SURGERY     left -"laser surgery by Dr. Kathlene Cote- 5 yrs ago" no removal  . TONSILLECTOMY    . TUBAL LIGATION     Family History  Problem Relation Age of Onset  . Bladder Cancer Father   . Diabetes Father   . Prostate cancer Father   . Alzheimer's disease Mother   . Diabetes Sister   . Lung cancer Sister         smoker  . Esophageal cancer Paternal Uncle   . Colon cancer Neg Hx    Social History   Socioeconomic History  . Marital status: Married    Spouse name: Not on file  . Number of children: 2  . Years of education: Not on file    . Highest education level: Not on file  Occupational History  . Occupation: Retired  Scientific laboratory technician  . Financial resource strain: Not hard at all  . Food insecurity:    Worry: Never true    Inability: Never true  . Transportation needs:    Medical: No    Non-medical: No  Tobacco Use  . Smoking status: Never Smoker  . Smokeless tobacco: Never Used  Substance and Sexual Activity  . Alcohol use: No  . Drug use: No  . Sexual activity: Never  Lifestyle  . Physical activity:    Days per week: 2 days    Minutes per session: 40 min  . Stress: Only a little  Relationships  . Social connections:    Talks on phone: More than three times a week    Gets together: More than three times a week    Attends religious service: More than 4 times per year    Active member of club or organization: Yes    Attends meetings of clubs or organizations: More than 4 times per year    Relationship status: Married  Other Topics Concern  . Not on file  Social History Narrative   Married '61   1 son- '65, 1 daughter '63; 6 children (2 adopted)   SO- SOB   Retirement- doing well   Marriage in good health   Patient has never smoked   Alcohol use- no   Pt gets regular exercise          Outpatient Encounter Medications as of 02/01/2018  Medication Sig  . acetaminophen (TYLENOL) 325 MG tablet Take 650 mg by mouth every 6 (six) hours as needed for mild pain.   Marland Kitchen albuterol (PROVENTIL HFA;VENTOLIN HFA) 108 (90 BASE) MCG/ACT inhaler Inhale 2 puffs into the lungs every 6 (six) hours as needed for wheezing or shortness of breath.  . ALPRAZolam (XANAX) 0.25 MG tablet Take 0.5 tablets (0.125 mg total) by mouth at bedtime as needed.  . diltiazem (CARDIZEM CD) 120 MG 24 hr capsule Take 120 mg by mouth daily.    . fluticasone (FLONASE) 50 MCG/ACT nasal spray instill 1 spray into each nostril twice a day if needed  . meclizine (ANTIVERT) 12.5 MG tablet Take 1-2 tablets (12.5-25 mg total) by mouth 3 (three) times  daily as needed for dizziness.  Marland Kitchen omeprazole (PRILOSEC) 40 MG capsule take 1 tablet by mouth twice a day  . OPSUMIT 10 MG TABS Take 1 tablet by mouth daily.   Marland Kitchen triamcinolone cream (KENALOG) 0.1 % Apply 1 application topically daily as needed (Irritated skin).   . [DISCONTINUED] benzonatate (TESSALON) 200  MG capsule Take 1 capsule (200 mg total) by mouth 3 (three) times daily as needed for cough. (Patient not taking: Reported on 01/19/2018)  . [DISCONTINUED] Cyanocobalamin (VITAMIN B-12 PO) Take 1 tablet by mouth daily.   . [DISCONTINUED] losartan (COZAAR) 25 MG tablet Take 25 mg by mouth daily as needed (Pt is instructed by doctor to take based on kidney lab work).    No facility-administered encounter medications on file as of 02/01/2018.     Activities of Daily Living In your present state of health, do you have any difficulty performing the following activities: 02/01/2018  Hearing? N  Vision? N  Difficulty concentrating or making decisions? N  Walking or climbing stairs? N  Dressing or bathing? N  Doing errands, shopping? N  Preparing Food and eating ? N  Using the Toilet? N  In the past six months, have you accidently leaked urine? N  Do you have problems with loss of bowel control? N  Managing your Medications? N  Managing your Finances? N  Housekeeping or managing your Housekeeping? N  Some recent data might be hidden    Patient Care Team: Hoyt Koch, MD as PCP - General (Internal Medicine)    Assessment:   This is a routine wellness examination for [place]. Physical assessment deferred to PCP.   Exercise Activities and Dietary recommendations Current Exercise Habits: Structured exercise class, Type of exercise: walking, Time (Minutes): 35, Frequency (Times/Week): 3, Weekly Exercise (Minutes/Week): 105, Intensity: Mild, Exercise limited by: orthopedic condition(s)  Diet (meal preparation, eat out, water intake, caffeinated beverages, dairy products, fruits and  vegetables): in general, a "healthy" diet  , well balanced,   Reviewed heart healthy diet, encouraged patient to increase daily water intake.  Goals    . Patient Stated     Continue to be active within the community by volunteering and stay socially active. Stay as healthy and as independent as possible       Fall Risk Fall Risk  02/01/2018 09/03/2017 01/08/2016 11/16/2014 01/30/2014  Falls in the past year? _0     Depression Screen PHQ 2/9 Scores 02/01/2018 09/03/2017 01/08/2016 11/16/2014  PHQ - 2 Score 1 0 0 0  PHQ- 9 Score 4 - - -     Cognitive Function MMSE - Mini Mental State Exam 02/01/2018  Orientation to time 5  Orientation to Place 5  Registration 3  Attention/ Calculation 5  Recall 2  Language- name 2 objects 2  Language- repeat 1  Language- follow 3 step command 3  Language- read & follow direction 1  Write a sentence 1  Copy design 1  Total score 29        Immunization History  Administered Date(s) Administered  . Influenza-Unspecified 07/15/2013, 07/19/2015  . Pneumococcal Conjugate-13 01/30/2014  . Pneumococcal Polysaccharide-23 08/30/2007  . Tdap 06/26/2011  . Zoster 08/15/2014   Screening Tests Health Maintenance  Topic Date Due  . INFLUENZA VACCINE  05/20/2018  . COLONOSCOPY  07/13/2019  . TETANUS/TDAP  06/25/2021  . DEXA SCAN  Completed  . PNA vac Low Risk Adult  Completed      Plan:     Continue doing brain stimulating activities (puzzles, reading, adult coloring books, staying active) to keep memory sharp.   Continue to eat heart healthy diet (full of fruits, vegetables, whole grains, lean protein, water--limit salt, fat, and sugar intake) and increase physical activity as tolerated.  I have personally reviewed and noted the following in the patient's chart:   .  Medical and social history . Use of alcohol, tobacco or illicit drugs  . Current medications and supplements . Functional ability and status . Nutritional  status . Physical activity . Advanced directives . List of other physicians . Vitals . Screenings to include cognitive, depression, and falls . Referrals and appointments  In addition, I have reviewed and discussed with patient certain preventive protocols, quality metrics, and best practice recommendations. A written personalized care plan for preventive services as well as general preventive health recommendations were provided to patient.     Michiel Cowboy, RN  02/01/2018

## 2018-02-01 NOTE — Telephone Encounter (Signed)
Spoke with Paula Ward. Advised of the recommendations. Patient thanks me for my call.

## 2018-02-01 NOTE — Progress Notes (Signed)
Medical screening examination/treatment/procedure(s) were performed by non-physician practitioner and as supervising physician I was immediately available for consultation/collaboration. I agree with above. Hoyt Koch, MD

## 2018-02-01 NOTE — Telephone Encounter (Signed)
-----  Message from Willia Craze, NP sent at 02/01/2018 12:06 PM EDT ----- Paula Ward, please let patient know that I did discuss her case with [place] who does esophageal manometry . She reviewed case and doesn't think patient will tolerate manometry study. Please see comments on my last office note. Thanks

## 2018-02-01 NOTE — Patient Instructions (Addendum)
Continue doing brain stimulating activities (puzzles, reading, adult coloring books, staying active) to keep memory sharp.   Continue to eat heart healthy diet (full of fruits, vegetables, whole grains, lean protein, water--limit salt, fat, and sugar intake) and increase physical activity as tolerated.   Paula Ward , Thank you for taking time to come for your Medicare Wellness Visit. I appreciate your ongoing commitment to your health goals. Please review the following plan we discussed and let me know if I can assist you in the future.   These are the goals we discussed: Goals    . Patient Stated     Continue to be active within the community by volunteering and stay socially active. Stay as healthy and as independent as possible       This is a list of the screening recommended for you and due dates:  Health Maintenance  Topic Date Due  . Flu Shot  05/20/2018  . Colon Cancer Screening  07/13/2019  . Tetanus Vaccine  06/25/2021  . DEXA scan (bone density measurement)  Completed  . Pneumonia vaccines  Completed   It is important to avoid accidents which may result in broken bones.  Here are a few ideas on how to make your home safer so you will be less likely to trip or fall.  1. Use nonskid mats or non slip strips in your shower or tub, on your bathroom floor and around sinks.  If you know that you have spilled water, wipe it up! 2. In the bathroom, it is important to have properly installed grab bars on the walls or on the edge of the tub.  Towel racks are NOT strong enough for you to hold onto or to pull on for support. 3. Stairs and hallways should have enough light.  Add lamps or night lights if you need ore light. 4. It is good to have handrails on both sides of the stairs if possible.  Always fix broken handrails right away. 5. It is important to see the edges of steps.  Paint the edges of outdoor steps white so you can see them better.  Put colored tape on the edge of inside  steps. 6. Throw-rugs are dangerous because they can slide.  Removing the rugs is the best idea, but if they must stay, add adhesive carpet tape to prevent slipping. 7. Do not keep things on stairs or in the halls.  Remove small furniture that blocks the halls as it may cause you to trip.  Keep telephone and electrical cords out of the way where you walk. 8. Always were sturdy, rubber-soled shoes for good support.  Never wear just socks, especially on the stairs.  Socks may cause you to slip or fall.  Do not wear full-length housecoats as you can easily trip on the bottom.  9. Place the things you use the most on the shelves that are the easiest to reach.  If you use a stepstool, make sure it is in good condition.  If you feel unsteady, DO NOT climb, ask for help. 10. If a health professional advises you to use a cane or walker, do not be ashamed.  These items can keep you from falling and breaking your bones.

## 2018-02-10 ENCOUNTER — Other Ambulatory Visit (INDEPENDENT_AMBULATORY_CARE_PROVIDER_SITE_OTHER): Payer: Medicare Other

## 2018-02-10 DIAGNOSIS — I272 Pulmonary hypertension, unspecified: Secondary | ICD-10-CM

## 2018-02-10 LAB — CBC
HCT: 30.9 % — ABNORMAL LOW (ref 36.0–46.0)
HEMOGLOBIN: 10.5 g/dL — AB (ref 12.0–15.0)
MCHC: 34 g/dL (ref 30.0–36.0)
MCV: 103.8 fl — AB (ref 78.0–100.0)
PLATELETS: 202 10*3/uL (ref 150.0–400.0)
RBC: 2.98 Mil/uL — ABNORMAL LOW (ref 3.87–5.11)
RDW: 13.2 % (ref 11.5–15.5)
WBC: 7.6 10*3/uL (ref 4.0–10.5)

## 2018-02-10 LAB — COMPREHENSIVE METABOLIC PANEL
ALT: 12 U/L (ref 0–35)
AST: 25 U/L (ref 0–37)
Albumin: 4.2 g/dL (ref 3.5–5.2)
Alkaline Phosphatase: 51 U/L (ref 39–117)
BUN: 27 mg/dL — ABNORMAL HIGH (ref 6–23)
CO2: 28 meq/L (ref 19–32)
Calcium: 9.2 mg/dL (ref 8.4–10.5)
Chloride: 101 mEq/L (ref 96–112)
Creatinine, Ser: 1.35 mg/dL — ABNORMAL HIGH (ref 0.40–1.20)
GFR: 40.16 mL/min — AB (ref >60.00)
Glucose, Bld: 100 mg/dL — ABNORMAL HIGH (ref 70–99)
POTASSIUM: 4.5 meq/L (ref 3.5–5.1)
SODIUM: 135 meq/L (ref 135–145)
Total Bilirubin: 0.5 mg/dL (ref 0.2–1.2)
Total Protein: 6.9 g/dL (ref 6.0–8.3)

## 2018-02-15 ENCOUNTER — Ambulatory Visit: Payer: Medicare Other | Admitting: Internal Medicine

## 2018-02-15 ENCOUNTER — Encounter: Payer: Self-pay | Admitting: Internal Medicine

## 2018-02-15 DIAGNOSIS — R682 Dry mouth, unspecified: Secondary | ICD-10-CM | POA: Insufficient documentation

## 2018-02-15 DIAGNOSIS — J3089 Other allergic rhinitis: Secondary | ICD-10-CM

## 2018-02-15 MED ORDER — DRY MOUTH MOUTHWASH MT LIQD: 5.0000 mL | OROMUCOSAL | 11 refills | Status: DC | PRN

## 2018-02-15 MED ORDER — MONTELUKAST SODIUM 10 MG PO TABS: 10.0000 mg | ORAL_TABLET | Freq: Every day | ORAL | 3 refills | Status: DC

## 2018-02-15 MED ORDER — PILOCARPINE HCL 5 MG PO TABS: 5.0000 mg | ORAL_TABLET | Freq: Three times a day (TID) | ORAL | 1 refills | Status: DC | PRN

## 2018-02-15 NOTE — Progress Notes (Signed)
   Subjective:    Patient ID: Paula Ward, female    DOB: 1936/04/02, 82 y.o.   MRN: [id number]  HPI The patient is a 82 YO female coming in for problems with post nasal drip as well as dry mouth. The dry mouth is causing severe mouth pain and some gum inflammation. She has been taking flonase for the last several months without relief. She also has been advised to double omeprazole to BID and has done that for 2 months without relief. She is clearing her throat a lot and especially in the morning. She is coughing up some yellowish phlegm. She denies fevers or chills. Symptoms are stable but ongoing for many months.   Review of Systems  Constitutional: Positive for appetite change. Negative for activity change, chills, fatigue, fever and unexpected weight change.  HENT: Positive for congestion, postnasal drip, rhinorrhea and sore throat. Negative for ear discharge, ear pain, sinus pressure, sinus pain, sneezing, tinnitus, trouble swallowing and voice change.   Eyes: Negative.   Respiratory: Positive for cough. Negative for chest tightness, shortness of breath and wheezing.   Cardiovascular: Negative.   Gastrointestinal: Negative.   Neurological: Negative.       Objective:   Physical Exam  Constitutional: She is oriented to person, place, and time. She appears well-developed and well-nourished.  thin  HENT:  Head: Normocephalic and atraumatic.  Tongue dry with excoriations, mouth dry in appearance  Eyes: EOM are normal.  Neck: Normal range of motion.  Cardiovascular: Normal rate and regular rhythm.  Pulmonary/Chest: Effort normal and breath sounds normal. No respiratory distress. She has no wheezes. She has no rales.  Abdominal: Soft. Bowel sounds are normal. She exhibits no distension. There is no tenderness. There is no rebound.  Musculoskeletal: She exhibits no edema.  Neurological: She is alert and oriented to person, place, and time. Coordination normal.  Skin: Skin is warm and  dry.   Vitals:   02/15/18 1049  BP: 126/60  Pulse: 75  Temp: 98.3 F (36.8 C)  TempSrc: Oral  SpO2: 97%  Weight: 114 lb (51.7 kg)  Height: _0  (1.626 m)      Assessment & Plan:

## 2018-02-15 NOTE — Assessment & Plan Note (Signed)
Moderate to severe symptoms. She is using otc biotene which burns and hurts. She is unable to eat certain foods. She is up to date on dental care. Tongue is excoriated and dry. Rx for salagen to see if this helps. She does have scleroderma and medications which can both cause and worsen dry mouth.

## 2018-02-15 NOTE — Assessment & Plan Note (Signed)
rx for singulair to try.

## 2018-02-15 NOTE — Patient Instructions (Addendum)
We have sent in [place] to take up to 3 times a day as needed to help you make more saliva.   We have sent in singulair to take for the drainage 1 pill a day.   It is okay to take omeprazole once a day now. It is okay to stop the flonase.

## 2018-02-18 ENCOUNTER — Encounter: Payer: Self-pay | Admitting: Internal Medicine

## 2018-03-07 ENCOUNTER — Other Ambulatory Visit: Payer: Self-pay | Admitting: Gastroenterology

## 2018-03-16 ENCOUNTER — Other Ambulatory Visit (INDEPENDENT_AMBULATORY_CARE_PROVIDER_SITE_OTHER): Payer: Medicare Other

## 2018-03-16 DIAGNOSIS — I272 Pulmonary hypertension, unspecified: Secondary | ICD-10-CM

## 2018-03-16 LAB — COMPREHENSIVE METABOLIC PANEL
ALK PHOS: 51 U/L (ref 39–117)
ALT: 11 U/L (ref 0–35)
AST: 21 U/L (ref 0–37)
Albumin: 4.2 g/dL (ref 3.5–5.2)
BUN: 32 mg/dL — AB (ref 6–23)
CHLORIDE: 103 meq/L (ref 96–112)
CO2: 26 meq/L (ref 19–32)
Calcium: 9.3 mg/dL (ref 8.4–10.5)
Creatinine, Ser: 1.33 mg/dL — ABNORMAL HIGH (ref 0.40–1.20)
GFR: 40.85 mL/min — AB (ref >60.00)
GLUCOSE: 112 mg/dL — AB (ref 70–99)
POTASSIUM: 4.8 meq/L (ref 3.5–5.1)
SODIUM: 137 meq/L (ref 135–145)
TOTAL PROTEIN: 6.8 g/dL (ref 6.0–8.3)
Total Bilirubin: 0.4 mg/dL (ref 0.2–1.2)

## 2018-03-16 LAB — CBC
HEMATOCRIT: 30.3 % — AB (ref 36.0–46.0)
HEMOGLOBIN: 10.5 g/dL — AB (ref 12.0–15.0)
MCHC: 34.6 g/dL (ref 30.0–36.0)
MCV: 103.4 fl — ABNORMAL HIGH (ref 78.0–100.0)
PLATELETS: 211 10*3/uL (ref 150.0–400.0)
RBC: 2.93 Mil/uL — ABNORMAL LOW (ref 3.87–5.11)
RDW: 12.7 % (ref 11.5–15.5)
WBC: 7.4 10*3/uL (ref 4.0–10.5)

## 2018-03-22 ENCOUNTER — Ambulatory Visit: Payer: Medicare Other | Admitting: Internal Medicine

## 2018-03-22 ENCOUNTER — Encounter: Payer: Self-pay | Admitting: Internal Medicine

## 2018-03-22 VITALS — BP 138/50 | HR 63 | Temp 97.9°F | Resp 16 | Wt 115.0 lb

## 2018-03-22 DIAGNOSIS — R21 Rash and other nonspecific skin eruption: Secondary | ICD-10-CM | POA: Diagnosis not present

## 2018-03-22 MED ORDER — MUPIROCIN CALCIUM 2 % EX CREA: 1.0000 "application " | TOPICAL_CREAM | Freq: Two times a day (BID) | CUTANEOUS | 0 refills | Status: DC

## 2018-03-22 NOTE — Assessment & Plan Note (Signed)
Most likely allergic dermatitis - ? Cause Unlikely cellulitis given lack of pain, warmth Will start triamcinolone cream, which she has at home Apply Bactroban bid Call if no improvement in two days Do not pop blister At home - keep uncovered, loose shoes when going out

## 2018-03-22 NOTE — Progress Notes (Signed)
Subjective:    Patient ID: Paula Ward, female    DOB: December 21, 1935, 82 y.o.   MRN: [id number]  HPI The patient is here for an acute visit for ? poison oak.   She first noticed the rash about one week ago.  It is on the dorsal aspect of her right foot near the first - 3rd MTP joints.  It was initially very itchy, but not itchy now.  It is not painful.  She has three clear fluid filled blisters with surrounding papular erythema.  She was out in the yard just before this happened and she wore shoes that came across that area.  She tried epsom salt soaks and has been applying polysporin.  There has been no change.  She has popped a couple of blisters, but they have come back.  She denies any expanding erythema.  She denies any fever/chills.  She denies pain, numbness/tingling in her leg.     She is unsure if she was bit or came in contact with something.   Medications and allergies reviewed with patient and updated if appropriate.  Patient Active Problem List   Diagnosis Date Noted  . Dry mouth 02/15/2018  . Allergic rhinitis 11/05/2017  . Cerebellar 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 ([place]) 10/26/2017  . Routine general medical examination at a health care facility 01/30/2017  . Pericardial effusion 01/21/2016  . 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HCC)   . Osteoarthritis 02/25/2015  . Mass of left side of neck   . CKD (chronic kidney disease) stage 3, GFR 30-59 ml/min (HCC) 09/01/2012  . RECTAL INCONTINENCE 03/27/2009  . Angiodysplasia of stomach and duodenum 08/22/2008  . ADENOCARCINOMA, BREAST, LEFT 07/26/2007  . Anemia, iron deficiency 07/26/2007  . Essential hypertension 07/26/2007  . Pulmonary hypertension ([place]) 07/26/2007  . GASTROESOPHAGEAL REFLUX DISEASE 07/26/2007  . SCLERODERMA 07/26/2007  . PULMONARY EMBOLISM, HX OF 07/26/2007    Current Outpatient Medications on File Prior to Visit  Medication Sig Dispense Refill  . acetaminophen (TYLENOL) 325 MG tablet Take  650 mg by mouth every 6 (six) hours as needed for mild pain.     Marland Kitchen albuterol (PROVENTIL HFA;VENTOLIN HFA) 108 (90 BASE) MCG/ACT inhaler Inhale 2 puffs into the lungs every 6 (six) hours as needed for wheezing or shortness of breath. 1 Inhaler 3  . ALPRAZolam (XANAX) 0.25 MG tablet Take 0.5 tablets (0.125 mg total) by mouth at bedtime as needed. 30 tablet 3  . diltiazem (CARDIZEM CD) 120 MG 24 hr capsule Take 120 mg by mouth daily.      . fluticasone (FLONASE) 50 MCG/ACT nasal spray instill 1 spray into each nostril twice a day if needed 48 g 3  . meclizine (ANTIVERT) 12.5 MG tablet Take 1-2 tablets (12.5-25 mg total) by mouth 3 (three) times daily as needed for dizziness. 30 tablet 1  . montelukast (SINGULAIR) 10 MG tablet Take 1 tablet (10 mg total) by mouth at bedtime. 30 tablet 3  . Mouthwashes (DRY MOUTH MOUTHWASH) LIQD Use as directed 5 mLs in the mouth or throat as needed (dry mouth). 473 mL 11  . omeprazole (PRILOSEC) 40 MG capsule TAKE 1 CAPSULE BY MOUTH TWICE A DAY 60 capsule 0  . OPSUMIT 10 MG TABS Take 1 tablet by mouth daily.     . pilocarpine (SALAGEN) 5 MG tablet Take 1 tablet (5 mg total) by mouth 3 (three) times daily as needed. 90 tablet 1  . triamcinolone cream (KENALOG) 0.1 % Apply 1 application  topically daily as needed (Irritated skin).      No current facility-administered medications on file prior to visit.     Past Medical History:  Diagnosis Date  . Anemia   . Angiodysplasia of stomach and duodenum   . Arthus phenomenon   . AVM (arteriovenous malformation)   . Blood transfusion without reported diagnosis    last 4 units 12-22-15, Iron infusion x2 last -01-07-16,01-14-16.  . Breast cancer ([place]) 1989   Left  . Candida esophagitis ([place])   . Cataract   . Chronic kidney disease    Chronic mild renal insuffiency  . CREST syndrome ([place])   . GERD (gastroesophageal reflux disease)    w/ HH  . Interstitial lung disease ([place])   . Nodule of kidney   . PONV (postoperative  nausea and vomiting)   . Pulmonary embolus ([place]) 2003  . Pulmonary hypertension ([place])    followed by Dr Gaynell Face at [facility], now Dr. Maryjean Ka visit end 2'17.  . Rectal incontinence   . Renal cell carcinoma ([place])   . Scleroderma ([place])   . Tubular adenoma of colon   . Uterine polyp     Past Surgical History:  Procedure Laterality Date  . APPENDECTOMY  1962  . BREAST LUMPECTOMY  1989   left  . CATARACT EXTRACTION, BILATERAL Bilateral 12/2013  . ENTEROSCOPY N/A 01/18/2016   Procedure: ENTEROSCOPY;  Surgeon: Mauri Pole, MD;  Location: [facility];  Service: Endoscopy;  Laterality: N/A;  . ESOPHAGOGASTRODUODENOSCOPY (EGD) WITH PROPOFOL N/A 12/21/2015   Procedure: ESOPHAGOGASTRODUODENOSCOPY (EGD) WITH PROPOFOL;  Surgeon: Irene Shipper, MD;  Location: [facility];  Service: Endoscopy;  Laterality: N/A;  . IR GENERIC HISTORICAL  06/05/2016   IR RADIOLOGIST EVAL & MGMT 06/05/2016 Aletta Edouard, MD [facility] INTERV RAD  . IVC Filter    . KIDNEY SURGERY     left -"laser surgery by Dr. Kathlene Cote- 5 yrs ago" no removal  . TONSILLECTOMY    . TUBAL LIGATION      Social History   Socioeconomic History  . Marital status: Married    Spouse name: Not on file  . Number of children: 2  . Years of education: Not on file  . Highest education level: Not on file  Occupational History  . Occupation: Retired  Scientific laboratory technician  . Financial resource strain: Not hard at all  . Food insecurity:    Worry: Never true    Inability: Never true  . Transportation needs:    Medical: No    Non-medical: No  Tobacco Use  . Smoking status: Never Smoker  . Smokeless tobacco: Never Used  Substance and Sexual Activity  . Alcohol use: No  . Drug use: No  . Sexual activity: Never  Lifestyle  . Physical activity:    Days per week: 2 days    Minutes per session: 40 min  . Stress: Only a little  Relationships  . Social connections:    Talks on phone: More than three times a week    Gets together: More than  three times a week    Attends religious service: More than 4 times per year    Active member of club or organization: Yes    Attends meetings of clubs or organizations: More than 4 times per year    Relationship status: Married  Other Topics Concern  . Not on file  Social History Narrative   Married '61   1 son- '65, 1 daughter '63; 6 children (2 adopted)  SO- SOB   Retirement- doing well   Marriage in good health   Patient has never smoked   Alcohol use- no   Pt gets regular exercise          Family History  Problem Relation Age of Onset  . Bladder Cancer Father   . Diabetes Father   . Prostate cancer Father   . Alzheimer's disease Mother   . Diabetes Sister   . Lung cancer Sister         smoker  . Esophageal cancer Paternal Uncle   . Colon cancer Neg Hx     Review of Systems  Constitutional: Negative for chills and fever.  Cardiovascular: Negative for leg swelling.  Skin: Positive for color change, rash and wound (blisters).       Objective:   Vitals:   03/22/18 1307  BP: (!) 138/50  Pulse: 63  Resp: 16  Temp: 97.9 F (36.6 C)  SpO2: 100%   BP Readings from Last 3 Encounters:  03/22/18 (!) 138/50  02/15/18 126/60  02/01/18 (!) 122/50   Wt Readings from Last 3 Encounters:  03/22/18 115 lb (52.2 kg)  02/15/18 114 lb (51.7 kg)  02/01/18 116 lb (52.6 kg)   Body mass index is 19.74 kg/m.   Physical Exam  Constitutional: She appears well-developed and well-nourished. No distress.  HENT:  Head: Normocephalic and atraumatic.  Musculoskeletal: She exhibits no edema.  Skin: Rash (erythematous rash with papules on dorsal right foot near 1-3rd MTP joints, 3 clear blisters, no open wound or discharge) noted. She is not diaphoretic.  No pain           Assessment & Plan:    See Problem List for Assessment and Plan of chronic medical problems.

## 2018-03-22 NOTE — Patient Instructions (Signed)
Start the triamcinolone cream twice day.  Also apply the mupirocin ointment twice a day.    If you do not see improvement or your rash gets worse, please call.

## 2018-04-12 ENCOUNTER — Encounter: Payer: Self-pay | Admitting: Internal Medicine

## 2018-04-12 ENCOUNTER — Other Ambulatory Visit (INDEPENDENT_AMBULATORY_CARE_PROVIDER_SITE_OTHER): Payer: Medicare Other

## 2018-04-12 DIAGNOSIS — I272 Pulmonary hypertension, unspecified: Secondary | ICD-10-CM | POA: Diagnosis not present

## 2018-04-12 DIAGNOSIS — R490 Dysphonia: Secondary | ICD-10-CM

## 2018-04-12 LAB — COMPREHENSIVE METABOLIC PANEL
ALK PHOS: 49 U/L (ref 39–117)
ALT: 11 U/L (ref 0–35)
AST: 21 U/L (ref 0–37)
Albumin: 4.4 g/dL (ref 3.5–5.2)
BUN: 30 mg/dL — ABNORMAL HIGH (ref 6–23)
CO2: 25 mEq/L (ref 19–32)
Calcium: 9.7 mg/dL (ref 8.4–10.5)
Chloride: 102 mEq/L (ref 96–112)
Creatinine, Ser: 1.28 mg/dL — ABNORMAL HIGH (ref 0.40–1.20)
GFR: 42.68 mL/min — AB (ref >60.00)
GLUCOSE: 112 mg/dL — AB (ref 70–99)
POTASSIUM: 4.2 meq/L (ref 3.5–5.1)
Sodium: 136 mEq/L (ref 135–145)
TOTAL PROTEIN: 7.1 g/dL (ref 6.0–8.3)
Total Bilirubin: 0.6 mg/dL (ref 0.2–1.2)

## 2018-04-12 LAB — CBC
HCT: 32.1 % — ABNORMAL LOW (ref 36.0–46.0)
HEMOGLOBIN: 11 g/dL — AB (ref 12.0–15.0)
MCHC: 34.3 g/dL (ref 30.0–36.0)
MCV: 102.5 fl — ABNORMAL HIGH (ref 78.0–100.0)
PLATELETS: 215 10*3/uL (ref 150.0–400.0)
RBC: 3.13 Mil/uL — ABNORMAL LOW (ref 3.87–5.11)
RDW: 12.6 % (ref 11.5–15.5)
WBC: 7.2 10*3/uL (ref 4.0–10.5)

## 2018-04-28 ENCOUNTER — Other Ambulatory Visit: Payer: Self-pay | Admitting: Gastroenterology

## 2018-05-18 ENCOUNTER — Other Ambulatory Visit (INDEPENDENT_AMBULATORY_CARE_PROVIDER_SITE_OTHER): Payer: Medicare Other

## 2018-05-18 DIAGNOSIS — I272 Pulmonary hypertension, unspecified: Secondary | ICD-10-CM

## 2018-05-18 LAB — COMPREHENSIVE METABOLIC PANEL
ALBUMIN: 4 g/dL (ref 3.5–5.2)
ALT: 14 U/L (ref 0–35)
AST: 17 U/L (ref 0–37)
Alkaline Phosphatase: 52 U/L (ref 39–117)
BUN: 30 mg/dL — ABNORMAL HIGH (ref 6–23)
CHLORIDE: 102 meq/L (ref 96–112)
CO2: 28 mEq/L (ref 19–32)
CREATININE: 1.15 mg/dL (ref 0.40–1.20)
Calcium: 8.9 mg/dL (ref 8.4–10.5)
GFR: 48.29 mL/min — ABNORMAL LOW (ref >60.00)
GLUCOSE: 114 mg/dL — AB (ref 70–99)
POTASSIUM: 4.1 meq/L (ref 3.5–5.1)
SODIUM: 137 meq/L (ref 135–145)
Total Bilirubin: 0.4 mg/dL (ref 0.2–1.2)
Total Protein: 6.4 g/dL (ref 6.0–8.3)

## 2018-05-18 LAB — CBC
HEMATOCRIT: 29.6 % — AB (ref 36.0–46.0)
HEMOGLOBIN: 10.1 g/dL — AB (ref 12.0–15.0)
MCHC: 34.2 g/dL (ref 30.0–36.0)
MCV: 102.3 fl — AB (ref 78.0–100.0)
Platelets: 221 10*3/uL (ref 150.0–400.0)
RBC: 2.89 Mil/uL — ABNORMAL LOW (ref 3.87–5.11)
RDW: 13 % (ref 11.5–15.5)
WBC: 7.7 10*3/uL (ref 4.0–10.5)

## 2018-05-23 ENCOUNTER — Other Ambulatory Visit: Payer: Self-pay

## 2018-05-23 ENCOUNTER — Inpatient Hospital Stay (HOSPITAL_BASED_OUTPATIENT_CLINIC_OR_DEPARTMENT_OTHER)
Admission: EM | Admit: 2018-05-23 | Discharge: 2018-05-25 | DRG: 378 | Disposition: A | Discharge status: Home / Self Care | Payer: Medicare Other | Attending: Internal Medicine | Admitting: Internal Medicine

## 2018-05-23 ENCOUNTER — Emergency Department (HOSPITAL_BASED_OUTPATIENT_CLINIC_OR_DEPARTMENT_OTHER): Payer: Medicare Other

## 2018-05-23 ENCOUNTER — Encounter (HOSPITAL_BASED_OUTPATIENT_CLINIC_OR_DEPARTMENT_OTHER): Payer: Self-pay | Admitting: Emergency Medicine

## 2018-05-23 DIAGNOSIS — Z800 Family history of malignant neoplasm of digestive organs: Secondary | ICD-10-CM

## 2018-05-23 DIAGNOSIS — I2723 Pulmonary hypertension due to lung diseases and hypoxia: Secondary | ICD-10-CM | POA: Diagnosis present

## 2018-05-23 DIAGNOSIS — Z86711 Personal history of pulmonary embolism: Secondary | ICD-10-CM

## 2018-05-23 DIAGNOSIS — R0602 Shortness of breath: Secondary | ICD-10-CM

## 2018-05-23 DIAGNOSIS — Z8042 Family history of malignant neoplasm of prostate: Secondary | ICD-10-CM

## 2018-05-23 DIAGNOSIS — N179 Acute kidney failure, unspecified: Secondary | ICD-10-CM | POA: Diagnosis present

## 2018-05-23 DIAGNOSIS — I272 Pulmonary hypertension, unspecified: Secondary | ICD-10-CM | POA: Diagnosis present

## 2018-05-23 DIAGNOSIS — Z885 Allergy status to narcotic agent status: Secondary | ICD-10-CM

## 2018-05-23 DIAGNOSIS — D62 Acute posthemorrhagic anemia: Secondary | ICD-10-CM | POA: Diagnosis present

## 2018-05-23 DIAGNOSIS — Z8052 Family history of malignant neoplasm of bladder: Secondary | ICD-10-CM

## 2018-05-23 DIAGNOSIS — Z801 Family history of malignant neoplasm of trachea, bronchus and lung: Secondary | ICD-10-CM

## 2018-05-23 DIAGNOSIS — K449 Diaphragmatic hernia without obstruction or gangrene: Secondary | ICD-10-CM | POA: Diagnosis present

## 2018-05-23 DIAGNOSIS — J849 Interstitial pulmonary disease, unspecified: Secondary | ICD-10-CM

## 2018-05-23 DIAGNOSIS — Z888 Allergy status to other drugs, medicaments and biological substances status: Secondary | ICD-10-CM | POA: Diagnosis not present

## 2018-05-23 DIAGNOSIS — N183 Chronic kidney disease, stage 3 unspecified: Secondary | ICD-10-CM | POA: Diagnosis present

## 2018-05-23 DIAGNOSIS — K31811 Angiodysplasia of stomach and duodenum with bleeding: Principal | ICD-10-CM | POA: Diagnosis present

## 2018-05-23 DIAGNOSIS — M341 CR(E)ST syndrome: Secondary | ICD-10-CM | POA: Diagnosis present

## 2018-05-23 DIAGNOSIS — I129 Hypertensive chronic kidney disease with stage 1 through stage 4 chronic kidney disease, or unspecified chronic kidney disease: Secondary | ICD-10-CM | POA: Diagnosis present

## 2018-05-23 DIAGNOSIS — Z853 Personal history of malignant neoplasm of breast: Secondary | ICD-10-CM

## 2018-05-23 DIAGNOSIS — K31819 Angiodysplasia of stomach and duodenum without bleeding: Secondary | ICD-10-CM | POA: Diagnosis present

## 2018-05-23 DIAGNOSIS — K219 Gastro-esophageal reflux disease without esophagitis: Secondary | ICD-10-CM | POA: Diagnosis present

## 2018-05-23 DIAGNOSIS — M349 Systemic sclerosis, unspecified: Secondary | ICD-10-CM | POA: Diagnosis present

## 2018-05-23 DIAGNOSIS — Z85528 Personal history of other malignant neoplasm of kidney: Secondary | ICD-10-CM

## 2018-05-23 DIAGNOSIS — K922 Gastrointestinal hemorrhage, unspecified: Secondary | ICD-10-CM | POA: Diagnosis present

## 2018-05-23 DIAGNOSIS — F419 Anxiety disorder, unspecified: Secondary | ICD-10-CM | POA: Diagnosis present

## 2018-05-23 DIAGNOSIS — N1832 Chronic kidney disease, stage 3b: Secondary | ICD-10-CM | POA: Diagnosis present

## 2018-05-23 DIAGNOSIS — K3182 Dieulafoy lesion (hemorrhagic) of stomach and duodenum: Secondary | ICD-10-CM | POA: Diagnosis present

## 2018-05-23 DIAGNOSIS — D509 Iron deficiency anemia, unspecified: Secondary | ICD-10-CM | POA: Diagnosis not present

## 2018-05-23 DIAGNOSIS — Z881 Allergy status to other antibiotic agents status: Secondary | ICD-10-CM

## 2018-05-23 DIAGNOSIS — I2729 Other secondary pulmonary hypertension: Secondary | ICD-10-CM | POA: Diagnosis present

## 2018-05-23 DIAGNOSIS — I10 Essential (primary) hypertension: Secondary | ICD-10-CM | POA: Diagnosis present

## 2018-05-23 LAB — CBC
HCT: 16.3 % — ABNORMAL LOW (ref 36.0–46.0)
Hemoglobin: 5.1 g/dL — CL (ref 12.0–15.0)
MCH: 34 pg (ref 26.0–34.0)
MCHC: 31.3 g/dL (ref 30.0–36.0)
MCV: 108.7 fL — AB (ref 78.0–100.0)
PLATELETS: 199 10*3/uL (ref 150–400)
RBC: 1.5 MIL/uL — AB (ref 3.87–5.11)
RDW: 13.6 % (ref 11.5–15.5)
WBC: 7.8 10*3/uL (ref 4.0–10.5)

## 2018-05-23 LAB — COMPREHENSIVE METABOLIC PANEL
ALT: 14 U/L (ref 0–44)
AST: 22 U/L (ref 15–41)
Albumin: 3.4 g/dL — ABNORMAL LOW (ref 3.5–5.0)
Alkaline Phosphatase: 35 U/L — ABNORMAL LOW (ref 38–126)
Anion gap: 9 (ref 5–15)
BILIRUBIN TOTAL: 0.4 mg/dL (ref 0.3–1.2)
BUN: 60 mg/dL — ABNORMAL HIGH (ref 8–23)
CALCIUM: 8.9 mg/dL (ref 8.9–10.3)
CHLORIDE: 105 mmol/L (ref 98–111)
CO2: 24 mmol/L (ref 22–32)
CREATININE: 1.27 mg/dL — AB (ref 0.44–1.00)
GFR, EST AFRICAN AMERICAN: 45 mL/min — AB (ref >60)
GFR, EST NON AFRICAN AMERICAN: 39 mL/min — AB (ref >60)
Glucose, Bld: 105 mg/dL — ABNORMAL HIGH (ref 70–99)
Potassium: 4.6 mmol/L (ref 3.5–5.1)
SODIUM: 138 mmol/L (ref 135–145)
TOTAL PROTEIN: 5.6 g/dL — AB (ref 6.5–8.1)

## 2018-05-23 LAB — OCCULT BLOOD X 1 CARD TO LAB, STOOL: Fecal Occult Bld: POSITIVE — AB

## 2018-05-23 LAB — PREPARE RBC (CROSSMATCH)

## 2018-05-23 MED ORDER — ALBUTEROL SULFATE (2.5 MG/3ML) 0.083% IN NEBU: 2.5000 mg | INHALATION_SOLUTION | Freq: Four times a day (QID) | RESPIRATORY_TRACT | Status: DC | PRN

## 2018-05-23 MED ORDER — MONTELUKAST SODIUM 10 MG PO TABS: 10.0000 mg | ORAL_TABLET | Freq: Every day | ORAL | Status: DC

## 2018-05-23 MED ORDER — PANTOPRAZOLE SODIUM 40 MG IV SOLR
40.0000 mg | Freq: Once | INTRAVENOUS | Status: AC
  Administered 2018-05-23: 40 mg via INTRAVENOUS
  Filled 2018-05-23: qty 40

## 2018-05-23 MED ORDER — DILTIAZEM HCL ER COATED BEADS 120 MG PO CP24: 120.0000 mg | ORAL_CAPSULE | Freq: Every day | ORAL | Status: DC

## 2018-05-23 MED ORDER — ACETAMINOPHEN 325 MG PO TABS: 650.0000 mg | ORAL_TABLET | Freq: Four times a day (QID) | ORAL | Status: DC | PRN

## 2018-05-23 MED ORDER — ALPRAZOLAM 0.25 MG PO TABS: 0.1250 mg | ORAL_TABLET | Freq: Every evening | ORAL | Status: DC | PRN

## 2018-05-23 MED ORDER — ALBUTEROL SULFATE HFA 108 (90 BASE) MCG/ACT IN AERS: 2.0000 | INHALATION_SPRAY | Freq: Four times a day (QID) | RESPIRATORY_TRACT | Status: DC | PRN

## 2018-05-23 MED ORDER — MACITENTAN 10 MG PO TABS
10.0000 mg | ORAL_TABLET | Freq: Every day | ORAL | Status: DC
  Administered 2018-05-24 – 2018-05-25 (×2): 10 mg via ORAL
  Filled 2018-05-23: qty 1

## 2018-05-23 MED ORDER — PILOCARPINE HCL 5 MG PO TABS
5.0000 mg | ORAL_TABLET | Freq: Three times a day (TID) | ORAL | Status: DC | PRN
  Filled 2018-05-23: qty 1

## 2018-05-23 MED ORDER — PANTOPRAZOLE SODIUM 40 MG PO TBEC
40.0000 mg | DELAYED_RELEASE_TABLET | Freq: Two times a day (BID) | ORAL | Status: DC
  Administered 2018-05-24 – 2018-05-25 (×3): 40 mg via ORAL
  Filled 2018-05-23 (×3): qty 1

## 2018-05-23 MED ORDER — DIPHENHYDRAMINE-ZINC ACETATE 2-0.1 % EX CREA
TOPICAL_CREAM | Freq: Three times a day (TID) | CUTANEOUS | Status: DC | PRN
Start: 2018-05-23 — End: 2018-05-25
  Filled 2018-05-23: qty 28

## 2018-05-23 MED ORDER — BOOST / RESOURCE BREEZE PO LIQD CUSTOM
1.0000 | Freq: Three times a day (TID) | ORAL | Status: DC
  Administered 2018-05-23 – 2018-05-25 (×4): 1 via ORAL

## 2018-05-23 MED ORDER — SODIUM CHLORIDE 0.9% IV SOLUTION
Freq: Once | INTRAVENOUS | Status: AC
  Administered 2018-05-23: 21:00:00 via INTRAVENOUS

## 2018-05-23 NOTE — H&P (Signed)
History and Physical    Paula Ward UJW:[id number] DOB: January 19, 1936 DOA: 05/23/2018  PCP: Hoyt Koch, MD   Patient coming from: [facility]    Chief Complaint: Black tarry stools  HPI: Paula Ward is a 82 y.o. female with medical history significant of GI bleed, anemia, crest syndrome, pulmonary hypertension, interstitial lung disease , CKD stage III who presents to med Center at [facility] with complaints of black tarry stools since last Wednesday.  Patient reported that she went to her PCPs office on last Tuesday.  Her hemoglobin usually stays around 11 and did notice a drop of hemoglobin to 10 at the time. Patient has been having black tarry stools since Wednesday and she has increasingly became short of breath and weak.  She presented to the [facility] for further evaluation.  Her hemoglobin was found to be 5.1.  Stool occult blood was positive. Patient has history of GI bleed in the past.  She follows with [facility].Her last endoscopy/colonoscopy was 2 and half years ago when she was found to have AVMs in the stomach and duodenum.  Patient does not report any intake of aspirin or NSAIDs or any other blood thinners she has history of  Crest syndrome, pulmonary hypertension, interstitial lung disease and follows at [facility]. Patient seen and examined the bedside after she presented to the [facility] long hospital.  She looked comfortable during my evaluation.  She was hemodynamically stable.  Patient denies any chest pain, shortness of breath, abdominal pain, nausea, vomiting, diarrhea, headache, palpitations, fever, chills or dysuria.  ED Course: [facility] hospitalist was asked for admission from [facility] .Gastroenterology was consulted.  Review of Systems: As per HPI otherwise 10 point review of systems negative.    Past Medical History:  Diagnosis Date  . Anemia   . Angiodysplasia of stomach and duodenum   . Arthus phenomenon   . AVM (arteriovenous malformation)   . Blood  transfusion without reported diagnosis    last 4 units 12-22-15, Iron infusion x2 last -01-07-16,01-14-16.  . Breast cancer ([place]) 1989   Left  . Candida esophagitis ([place])   . Cataract   . Chronic kidney disease    Chronic mild renal insuffiency  . CREST syndrome ([place])   . GERD (gastroesophageal reflux disease)    w/ HH  . Interstitial lung disease ([place])   . Nodule of kidney   . PONV (postoperative nausea and vomiting)   . Pulmonary embolus ([place]) 2003  . Pulmonary hypertension ([place])    followed by Dr Gaynell Face at [name], now Dr. Maryjean Ka visit end 2'17.  . Rectal incontinence   . Renal cell carcinoma ([place])   . Scleroderma ([place])   . Tubular adenoma of colon   . Uterine polyp     Past Surgical History:  Procedure Laterality Date  . APPENDECTOMY  1962  . BREAST LUMPECTOMY  1989   left  . CATARACT EXTRACTION, BILATERAL Bilateral 12/2013  . ENTEROSCOPY N/A 01/18/2016   Procedure: ENTEROSCOPY;  Surgeon: Mauri Pole, MD;  [facility];  Service: Endoscopy;  Laterality: N/A;  . ESOPHAGOGASTRODUODENOSCOPY (EGD) WITH PROPOFOL N/A 12/21/2015   Procedure: ESOPHAGOGASTRODUODENOSCOPY (EGD) WITH PROPOFOL;  Surgeon: Irene Shipper, MD;  [facility];  Service: Endoscopy;  Laterality: N/A;  . IR GENERIC HISTORICAL  06/05/2016   IR RADIOLOGIST EVAL & MGMT 06/05/2016 Aletta Edouard, MD [facility] INTERV RAD  . IVC Filter    . KIDNEY SURGERY     left -"laser surgery  by Dr. Kathlene Cote- 5 yrs ago" no removal  . TONSILLECTOMY    . TUBAL LIGATION       reports that she has never smoked. She has never used smokeless tobacco. She reports that she does not drink alcohol or use drugs.  Allergies  Allergen Reactions  . Codeine Nausea Only    Hallucinations, too  . Other Nausea And Vomiting    "-mycin" antibiotics.   Also cause hallucinations.  . Lisinopril     Pt doesn't remember     Family History  Problem Relation Age of Onset  . Bladder Cancer Father   . Diabetes Father   .  Prostate cancer Father   . Alzheimer's disease Mother   . Diabetes Sister   . Lung cancer Sister         smoker  . Esophageal cancer Paternal Uncle   . Colon cancer Neg Hx      Prior to Admission medications   Medication Sig Start Date End Date Taking? Authorizing Provider  acetaminophen (TYLENOL) 325 MG tablet Take 650 mg by mouth every 6 (six) hours as needed for mild pain.     [provider]  albuterol (PROVENTIL HFA;VENTOLIN HFA) 108 (90 BASE) MCG/ACT inhaler Inhale 2 puffs into the lungs every 6 (six) hours as needed for wheezing or shortness of breath. 08/31/15   Hoyt Koch, MD  ALPRAZolam Duanne Moron) 0.25 MG tablet Take 0.5 tablets (0.125 mg total) by mouth at bedtime as needed. 12/03/16   Hoyt Koch, MD  diltiazem (CARDIZEM CD) 120 MG 24 hr capsule Take 120 mg by mouth daily.      [provider]  fluticasone Asencion Islam) 50 MCG/ACT nasal spray instill 1 spray into each nostril twice a day if needed 11/05/17   Hoyt Koch, MD  meclizine (ANTIVERT) 12.5 MG tablet Take 1-2 tablets (12.5-25 mg total) by mouth 3 (three) times daily as needed for dizziness. 10/26/17   Burns, Claudina Lick, MD  montelukast (SINGULAIR) 10 MG tablet Take 1 tablet (10 mg total) by mouth at bedtime. 02/15/18   Hoyt Koch, MD  Mouthwashes (DRY MOUTH MOUTHWASH) LIQD Use as directed 5 mLs in the mouth or throat as needed (dry mouth). 02/15/18   Hoyt Koch, MD  mupirocin cream (BACTROBAN) 2 % Apply 1 application topically 2 (two) times daily. 03/22/18   Binnie Rail, MD  omeprazole (PRILOSEC) 40 MG capsule TAKE 1 CAPSULE BY MOUTH TWICE A DAY 04/28/18   Nandigam, Venia Minks, MD  OPSUMIT 10 MG TABS Take 1 tablet by mouth daily.  08/16/15   [provider]  pilocarpine (SALAGEN) 5 MG tablet Take 1 tablet (5 mg total) by mouth 3 (three) times daily as needed. 02/15/18   Hoyt Koch, MD  triamcinolone cream (KENALOG) 0.1 % Apply 1 application  topically daily as needed (Irritated skin).  11/30/15   [provider]    Physical Exam: Vitals:   05/23/18 1500 05/23/18 1600 05/23/18 1700 05/23/18 1728  BP: (!) 114/49 (!) 111/54  (!) 122/39  Pulse: 100 81  82  Resp: (!) 28 (!) 21  18  Temp:    98.4 F (36.9 C)  TempSrc:    Oral  SpO2: 100% 97%  100%  Weight:   51.4 kg (113 lb 4.8 oz)   Height:   _0  (1.626 m)     Constitutional: NAD, calm, comfortable, very pleasant elderly female Vitals:   05/23/18 1500 05/23/18 1600 05/23/18 1700 05/23/18  1728  BP: (!) 114/49 (!) 111/54  (!) 122/39  Pulse: 100 81  82  Resp: (!) 28 (!) 21  18  Temp:    98.4 F (36.9 C)  TempSrc:    Oral  SpO2: 100% 97%  100%  Weight:   51.4 kg (113 lb 4.8 oz)   Height:   _0  (1.626 m)    Eyes: PERRL, lids and conjunctivae normal ENMT: Mucous membranes are moist. Posterior pharynx clear of any exudate or lesions.Normal dentition.  Neck: normal, supple, no masses, no thyromegaly Respiratory: Bilateral crackles, Normal respiratory effort. No accessory muscle use.  Cardiovascular: Regular rate and rhythm, no murmurs / rubs / gallops. No extremity edema. 2+ pedal pulses. No carotid bruits.  Abdomen: no tenderness, no masses palpated. No hepatosplenomegaly. Bowel sounds positive.  Musculoskeletal: no clubbing / cyanosis. No joint deformity upper and lower extremities. Good ROM, no contractures. Normal muscle tone.  Skin: Pallor,no rashes, lesions, ulcers. No induration Neurologic: CN 2-12 grossly intact. Sensation intact, DTR normal. Strength 5/5 in all 4.  Psychiatric: Normal judgment and insight. Alert and oriented x 3. Normal mood.   Foley Catheter:None  Labs on Admission: I have personally reviewed following labs and imaging studies  CBC: Recent Labs  Lab 05/18/18 1627 05/23/18 1344  WBC 7.7 7.8  HGB 10.1* 5.1*  HCT 29.6* 16.3*  MCV 102.3* 108.7*  PLT 221.0 403   Basic Metabolic Panel: Recent Labs  Lab 05/18/18 1627  05/23/18 1344  NA 137 138  K 4.1 4.6  CL 102 105  CO2 28 24  GLUCOSE 114* 105*  BUN 30* 60*  CREATININE 1.15 1.27*  CALCIUM 8.9 8.9   GFR: Estimated Creatinine Clearance: 29.1 mL/min (A) (by C-G formula based on SCr of 1.27 mg/dL (H)). Liver Function Tests: Recent Labs  Lab 05/18/18 1627 05/23/18 1344  AST 17 22  ALT 14 14  ALKPHOS 52 35*  BILITOT 0.4 0.4  PROT 6.4 5.6*  ALBUMIN 4.0 3.4*   No results for input(s): LIPASE, AMYLASE in the last 168 hours. No results for input(s): AMMONIA in the last 168 hours. Coagulation Profile: No results for input(s): INR, PROTIME in the last 168 hours. Cardiac Enzymes: No results for input(s): CKTOTAL, CKMB, CKMBINDEX, TROPONINI in the last 168 hours. BNP (last 3 results) No results for input(s): PROBNP in the last 8760 hours. HbA1C: No results for input(s): HGBA1C in the last 72 hours. CBG: No results for input(s): GLUCAP in the last 168 hours. Lipid Profile: No results for input(s): CHOL, HDL, LDLCALC, TRIG, CHOLHDL, LDLDIRECT in the last 72 hours. Thyroid Function Tests: No results for input(s): TSH, T4TOTAL, FREET4, T3FREE, THYROIDAB in the last 72 hours. Anemia Panel: No results for input(s): VITAMINB12, FOLATE, FERRITIN, TIBC, IRON, RETICCTPCT in the last 72 hours. Urine analysis:    Component Value Date/Time   COLORURINE YELLOW 06/19/2017 [place] 06/19/2017 1707   LABSPEC 1.020 06/19/2017 1707   PHURINE 6.0 06/19/2017 1707   GLUCOSEU NEGATIVE 06/19/2017 1707   HGBUR NEGATIVE 06/19/2017 1707   BILIRUBINUR Neg 09/03/2017 [place] 06/19/2017 1707   PROTEINUR 30 09/03/2017 1529   PROTEINUR NEGATIVE 12/24/2015 2233   UROBILINOGEN 0.2 09/03/2017 1529   UROBILINOGEN 0.2 06/19/2017 1707   NITRITE Neg 09/03/2017 1529   NITRITE NEGATIVE 06/19/2017 1707   LEUKOCYTESUR Negative 09/03/2017 1529    Radiological Exams on Admission: Dg Chest Port 1 View  Result Date: 05/23/2018 CLINICAL  DATA:  Shortness of breath EXAM:  PORTABLE CHEST 1 VIEW COMPARISON:  09/17/2016 FINDINGS: Chronic interstitial markings. Radiation changes in the left upper lobe. Subpleural reticulation/scarring in the lungs bilaterally, particularly in the right upper lobe. Mild left basilar opacity, likely scarring/atelectasis. No focal consolidation. No definite pleural effusions. Mild eventration of the left hemidiaphragm. No pneumothorax. Cardiomegaly. Postsurgical changes in the left breast and left axilla. IMPRESSION: Chronic interstitial markings with postsurgical and suspected post radiation changes in the left hemithorax. No evidence of acute cardiopulmonary disease. Electronically Signed   By: Julian Hy M.D.   On: 05/23/2018 15:18     Assessment/Plan Principal Problem:   GI bleed Active Problems:   Anemia, iron deficiency   Essential hypertension   Pulmonary hypertension (HCC)   Angiodysplasia of stomach and duodenum   SCLERODERMA   ILD (interstitial lung disease) (HCC)   CKD (chronic kidney disease) stage 3, GFR 30-59 ml/min (HCC)  Acute blood loss anemia: Secondary to GI bleed.  We will transfuse her with 3 units of PRBC.  Will check CBC tomorrow morning .Patient is hemodynamically stable..  GI bleed: Most likely secondary to AVMs.  She has history of AVMs as per past  EGD/ colonoscopy report done about 21/2 years ago.  Patient did not report of any intake of aspirin or NSAIDs.  Reported multiple black tarry stools at home. We will continue clear liquid diet for now.  N.p.o. after midnight.  GI has already been consulted.  Likely EGD /coloscopy tomorrow.  History of hypertension: Currently blood pressure soft.  We will continue to monitor.  Will hold Cardizem.  AKI on  CKD stage III: Elevated BUN and creatinine from baseline.  BUN elevation could be secondary to GI bleed.  Her creatinine was 1.15 on 05/18/2018.  Anticipate improvement in the kidney function tomorrow .  History of  ILD/crest syndrome/scleroderma: Follows at [facility].  Has bilateral crackles which is a chronic finding for her.    Pulmonary hypertension: Secondary to above.  Follows with pulmonary at [facility].  On Opsumit(Macitentan).  Also on Cardizem.  Currently her respiratory status stable.  Continue supplemental oxygen as needed.  Not on oxygen at home.  Anxiety: Continue Xanax as needed.    Severity of Illness: The appropriate patient status for this patient is INPATIENT.    DVT prophylaxis: SCD Code Status: Full Family Communication: Family member present at the bedside Consults called: GI     Shelly Coss MD [facility] Pager [phone]  If 7PM-7AM, please contact night-coverage [email or web address] Password [email or web address]  05/23/2018, 5:51 PM

## 2018-05-23 NOTE — ED Provider Notes (Addendum)
[facility] EMERGENCY DEPARTMENT Provider Note   CSN: [id number] Arrival date & time: 05/23/18  1307     History   Chief Complaint Chief Complaint  Patient presents with  . Rectal Bleeding    HPI RISHITA PETRON is a 82 y.o. female.  Patient is a 82 year old female who presents with black tarry stools.  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  She was admitted a few years ago for GI bleeding related to these AVMs.  She has a 2-day history of black tarry stools.  She is also felt dizzy and lightheaded.  She has a little more shortness of breath than she typically does.  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  She is not on home oxygen.  She denies any abdominal pain.  No bright red blood per rectum.     Past Medical History:  Diagnosis Date  . Anemia   . Angiodysplasia of stomach and duodenum   . Arthus phenomenon   . AVM (arteriovenous malformation)   . Blood transfusion without reported diagnosis    last 4 units 12-22-15, Iron infusion x2 last -01-07-16,01-14-16.  . Breast cancer ([place]) 1989   Left  . Candida esophagitis ([place])   . Cataract   . Chronic kidney disease    Chronic mild renal insuffiency  . CREST syndrome ([place])   . GERD (gastroesophageal reflux disease)    w/ HH  . Interstitial lung disease ([place])   . Nodule of kidney   . PONV (postoperative nausea and vomiting)   . Pulmonary embolus ([place]) 2003  . Pulmonary hypertension ([place])    followed by Dr Gaynell Face at [facility], now Dr. Maryjean Ka visit end 2'17.  . Rectal incontinence   . Renal cell carcinoma ([place])   . Scleroderma ([place])   . Tubular adenoma of colon   . Uterine polyp     Patient Active Problem List   Diagnosis Date Noted  . GI bleed 05/23/2018  . Rash and nonspecific skin eruption 03/22/2018  . Dry mouth 02/15/2018  . Allergic rhinitis 11/05/2017  . Cerebellar 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 ([place]) 10/26/2017  . Routine general  medical examination at a health care facility 01/30/2017  . Pericardial effusion 01/21/2016  . 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HCC)   . Osteoarthritis 02/25/2015  . Mass of left side of neck   . CKD (chronic kidney disease) stage 3, GFR 30-59 ml/min (HCC) 09/01/2012  . RECTAL INCONTINENCE 03/27/2009  . Angiodysplasia of stomach and duodenum 08/22/2008  . ADENOCARCINOMA, BREAST, LEFT 07/26/2007  . Anemia, iron deficiency 07/26/2007  . Essential hypertension 07/26/2007  . Pulmonary hypertension ([place]) 07/26/2007  . GASTROESOPHAGEAL REFLUX DISEASE 07/26/2007  . SCLERODERMA 07/26/2007  . PULMONARY EMBOLISM, HX OF 07/26/2007    Past Surgical History:  Procedure Laterality Date  . APPENDECTOMY  1962  . BREAST LUMPECTOMY  1989   left  . CATARACT EXTRACTION, BILATERAL Bilateral 12/2013  . ENTEROSCOPY N/A 01/18/2016   Procedure: ENTEROSCOPY;  Surgeon: Mauri Pole, MD;  Location: [facility] ENDOSCOPY;  Service: Endoscopy;  Laterality: N/A;  . ESOPHAGOGASTRODUODENOSCOPY (EGD) WITH PROPOFOL N/A 12/21/2015   Procedure: ESOPHAGOGASTRODUODENOSCOPY (EGD) WITH PROPOFOL;  Surgeon: Irene Shipper, MD;  Location: [facility] ENDOSCOPY;  Service: Endoscopy;  Laterality: N/A;  . IR GENERIC HISTORICAL  06/05/2016   IR RADIOLOGIST EVAL & MGMT 06/05/2016 Aletta Edouard, MD [facility] INTERV RAD  . IVC Filter    . KIDNEY  SURGERY     left -"laser surgery by Dr. Kathlene Cote- 5 yrs ago" no removal  . TONSILLECTOMY    . TUBAL LIGATION       OB History   None      Home Medications    Prior to Admission medications   Medication Sig Start Date End Date Taking? Authorizing Provider  acetaminophen (TYLENOL) 325 MG tablet Take 650 mg by mouth every 6 (six) hours as needed for mild pain.     [provider]  albuterol (PROVENTIL HFA;VENTOLIN HFA) 108 (90 BASE) MCG/ACT inhaler Inhale 2 puffs into the lungs every 6 (six) hours as needed for wheezing or shortness of breath. 08/31/15   Hoyt Koch, MD  ALPRAZolam  Duanne Moron) 0.25 MG tablet Take 0.5 tablets (0.125 mg total) by mouth at bedtime as needed. 12/03/16   Hoyt Koch, MD  diltiazem (CARDIZEM CD) 120 MG 24 hr capsule Take 120 mg by mouth daily.      [provider]  fluticasone Asencion Islam) 50 MCG/ACT nasal spray instill 1 spray into each nostril twice a day if needed 11/05/17   Hoyt Koch, MD  meclizine (ANTIVERT) 12.5 MG tablet Take 1-2 tablets (12.5-25 mg total) by mouth 3 (three) times daily as needed for dizziness. 10/26/17   Burns, Claudina Lick, MD  montelukast (SINGULAIR) 10 MG tablet Take 1 tablet (10 mg total) by mouth at bedtime. 02/15/18   Hoyt Koch, MD  Mouthwashes (DRY MOUTH MOUTHWASH) LIQD Use as directed 5 mLs in the mouth or throat as needed (dry mouth). 02/15/18   Hoyt Koch, MD  mupirocin cream (BACTROBAN) 2 % Apply 1 application topically 2 (two) times daily. 03/22/18   Binnie Rail, MD  omeprazole (PRILOSEC) 40 MG capsule TAKE 1 CAPSULE BY MOUTH TWICE A DAY 04/28/18   Nandigam, Venia Minks, MD  OPSUMIT 10 MG TABS Take 1 tablet by mouth daily.  08/16/15   [provider]  pilocarpine (SALAGEN) 5 MG tablet Take 1 tablet (5 mg total) by mouth 3 (three) times daily as needed. 02/15/18   Hoyt Koch, MD  triamcinolone cream (KENALOG) 0.1 % Apply 1 application topically daily as needed (Irritated skin).  11/30/15   [provider]    Family History Family History  Problem Relation Age of Onset  . Bladder Cancer Father   . Diabetes Father   . Prostate cancer Father   . Alzheimer's disease Mother   . Diabetes Sister   . Lung cancer Sister         smoker  . Esophageal cancer Paternal Uncle   . Colon cancer Neg Hx     Social History Social History   Tobacco Use  . Smoking status: Never Smoker  . Smokeless tobacco: Never Used  Substance Use Topics  . Alcohol use: No  . Drug use: No     Allergies   Codeine; Other; and Lisinopril   Review of Systems Review of  Systems  Constitutional: Negative for chills, diaphoresis, fatigue and fever.  HENT: Negative for congestion, rhinorrhea and sneezing.   Eyes: Negative.   Respiratory: Positive for shortness of breath. Negative for cough and chest tightness.   Cardiovascular: Negative for chest pain and leg swelling.  Gastrointestinal: Negative for abdominal pain, blood in stool, diarrhea, nausea and vomiting.  Genitourinary: Negative for difficulty urinating, flank pain, frequency and hematuria.  Musculoskeletal: Negative for arthralgias and back pain.  Skin: Negative for rash.  Neurological: Positive for light-headedness. Negative for  dizziness, speech difficulty, weakness, numbness and headaches.     Physical Exam Updated Vital Signs BP (!) 114/49   Pulse 100   Temp 99 F (37.2 C) (Axillary)   Resp (!) 28   Ht _0  (1.626 m)   Wt 51.7 kg (114 lb)   SpO2 100%   BMI 19.57 kg/m   Physical Exam  Constitutional: She is oriented to person, place, and time. She appears well-developed and well-nourished.  HENT:  Head: Normocephalic and atraumatic.  Eyes: Pupils are equal, round, and reactive to light.  Neck: Normal range of motion. Neck supple.  Cardiovascular: Normal rate and regular rhythm.  Murmur heard. Pulmonary/Chest: Effort normal and breath sounds normal. No respiratory distress. She has no wheezes. She has no rales. She exhibits no tenderness.  Few crackles in the bases  Abdominal: Soft. Bowel sounds are normal. There is no tenderness. There is no rebound and no guarding.  Genitourinary:  Genitourinary Comments: Patient has black stools which is Hemoccult positive  Musculoskeletal: Normal range of motion. She exhibits no edema.  Lymphadenopathy:    She has no cervical adenopathy.  Neurological: She is alert and oriented to person, place, and time.  Skin: Skin is warm and dry. No rash noted.  Psychiatric: She has a normal mood and affect.     ED Treatments / Results  Labs (all  labs ordered are listed, but only abnormal results are displayed) Labs Reviewed  COMPREHENSIVE METABOLIC PANEL - Abnormal; Notable for the following components:      Result Value   Glucose, Bld 105 (*)    BUN 60 (*)    Creatinine, Ser 1.27 (*)    Total Protein 5.6 (*)    Albumin 3.4 (*)    Alkaline Phosphatase 35 (*)    GFR calc non Af Amer 39 (*)    GFR calc Af Amer 45 (*)    All other components within normal limits  CBC - Abnormal; Notable for the following components:   RBC 1.50 (*)    Hemoglobin 5.1 (*)    HCT 16.3 (*)    MCV 108.7 (*)    All other components within normal limits  OCCULT BLOOD X 1 CARD TO LAB, STOOL - Abnormal; Notable for the following components:   Fecal Occult Bld POSITIVE (*)    All other components within normal limits  POC OCCULT BLOOD, ED    EKG None  Radiology Dg Chest Port 1 View  Result Date: 05/23/2018 CLINICAL DATA:  Shortness of breath EXAM: PORTABLE CHEST 1 VIEW COMPARISON:  09/17/2016 FINDINGS: Chronic interstitial markings. Radiation changes in the left upper lobe. Subpleural reticulation/scarring in the lungs bilaterally, particularly in the right upper lobe. Mild left basilar opacity, likely scarring/atelectasis. No focal consolidation. No definite pleural effusions. Mild eventration of the left hemidiaphragm. No pneumothorax. Cardiomegaly. Postsurgical changes in the left breast and left axilla. IMPRESSION: Chronic interstitial markings with postsurgical and suspected post radiation changes in the left hemithorax. No evidence of acute cardiopulmonary disease. Electronically Signed   By: Julian Hy M.D.   On: 05/23/2018 15:18    Procedures Procedures (including critical care time)  Medications Ordered in ED Medications  pantoprazole (PROTONIX) injection 40 mg (40 mg Intravenous Given 05/23/18 1506)     Initial Impression / Assessment and Plan / ED Course  I have reviewed the triage vital signs and the nursing notes.  Pertinent  labs & imaging results that were available during my care of the patient were reviewed by  me and considered in my medical decision making (see chart for details).     Patient is a 82 year old female who presents with melena.  She is Hemoccult positive.  Her hemoglobin is 5.1 which is dropped from 10 earlier this week.  She appears hemodynamically stable at this point.  I feel that she does need urgent blood transfusion but the only blood we have at [facility] is emergency release blood.  I feel at this point that she can wait and be crossmatched at [facility].  If she becomes more hemodynamically unstable, we can proceed with the emergency release blood here.  I spoke with Dr. Tawanna Solo who has accepted the patient for transfer to [facility].  I also notified Dr. Havery Moros with [facility] who will see the patient at [facility].  She was given IV Protonix.  She has had some mild hypoxia and is requiring nasal cannula but seems to be doing okay on the nasal cannula.  She has underlying crest syndrome and pulmonary hypertension.  Her chest x-ray shows chronic interstitial disease but otherwise unremarkable.  CRITICAL CARE Performed by: Malvin Johns Total critical care time: 45 minutes Critical care time was exclusive of separately billable procedures and treating other patients. Critical care was necessary to treat or prevent imminent or life-threatening deterioration. Critical care was time spent personally by me on the following activities: development of treatment plan with patient and/or surrogate as well as nursing, discussions with consultants, evaluation of patient's response to treatment, examination of patient, obtaining history from patient or surrogate, ordering and performing treatments and interventions, ordering and review of laboratory studies, ordering and review of radiographic studies, pulse oximetry and re-evaluation of patient's condition.   Final Clinical Impressions(s) / ED  Diagnoses   Final diagnoses:  Acute GI bleeding    ED Discharge Orders    None       Malvin Johns, MD 05/23/18 1551    Malvin Johns, MD 05/23/18 [phone]

## 2018-05-23 NOTE — ED Triage Notes (Signed)
Patient states that she is having rectal bleeding - reports that she is having black stools since yesterday  - the patient is a/o but reports that she is dizzy and lightheaded had SOB yesterday  - reports that she had a "couple of little turds" yesterday and 2 BM's today  - that are black

## 2018-05-23 NOTE — ED Notes (Signed)
Date and time results received: 05/23/18 2:43 PM (use smartphrase ".now" to insert current time)  Test: HGB  Critical Value: 5.1  Name of Provider Notified: Dr Tamera Punt  Orders Received? Or Actions Taken?: Awaiting orders

## 2018-05-23 NOTE — ED Notes (Addendum)
Pt placed back on 2 L O2 due to pulse ox decreased to 80% after ambulating to the bathroom

## 2018-05-23 NOTE — Plan of Care (Signed)
Patient received as transfer from [facility] with GI bleed.  VSS on admission, requiring oxygen at 2 liters at this time to maintain oxygen saturation in high 90's.  Denies pain.  No bright red bleeding per patient, describes stool as "black".  Husband at bedside.

## 2018-05-23 NOTE — ED Notes (Signed)
Pt pulse ox 89% on room air. Pt placed on 2 L O2 via [place], pulse ox increased to 100 %.

## 2018-05-23 NOTE — ED Notes (Signed)
Attempt to call report to floor RN, RN unavailable

## 2018-05-24 ENCOUNTER — Inpatient Hospital Stay (HOSPITAL_COMMUNITY): Payer: Medicare Other | Admitting: Certified Registered Nurse Anesthetist

## 2018-05-24 ENCOUNTER — Inpatient Hospital Stay (HOSPITAL_COMMUNITY): Payer: Medicare Other

## 2018-05-24 ENCOUNTER — Encounter (HOSPITAL_COMMUNITY)
Admission: EM | Disposition: A | Discharge status: Home / Self Care | Payer: Self-pay | Source: Home / Self Care | Attending: Internal Medicine

## 2018-05-24 ENCOUNTER — Encounter (HOSPITAL_COMMUNITY): Payer: Self-pay | Admitting: *Deleted

## 2018-05-24 DIAGNOSIS — N183 Chronic kidney disease, stage 3 (moderate): Secondary | ICD-10-CM

## 2018-05-24 DIAGNOSIS — K449 Diaphragmatic hernia without obstruction or gangrene: Secondary | ICD-10-CM

## 2018-05-24 DIAGNOSIS — D62 Acute posthemorrhagic anemia: Secondary | ICD-10-CM

## 2018-05-24 DIAGNOSIS — K31819 Angiodysplasia of stomach and duodenum without bleeding: Secondary | ICD-10-CM

## 2018-05-24 HISTORY — PX: HOT HEMOSTASIS: SHX5433

## 2018-05-24 HISTORY — PX: SCHLEROTHERAPY: SHX5440

## 2018-05-24 HISTORY — PX: ENTEROSCOPY: SHX5533

## 2018-05-24 LAB — BASIC METABOLIC PANEL
Anion gap: 7 (ref 5–15)
BUN: 42 mg/dL — AB (ref 8–23)
CHLORIDE: 108 mmol/L (ref 98–111)
CO2: 26 mmol/L (ref 22–32)
Calcium: 8.6 mg/dL — ABNORMAL LOW (ref 8.9–10.3)
Creatinine, Ser: 0.94 mg/dL (ref 0.44–1.00)
GFR calc non Af Amer: 56 mL/min — ABNORMAL LOW (ref >60)
Glucose, Bld: 89 mg/dL (ref 70–99)
POTASSIUM: 4.4 mmol/L (ref 3.5–5.1)
SODIUM: 141 mmol/L (ref 135–145)

## 2018-05-24 LAB — CBC
HEMATOCRIT: 27.6 % — AB (ref 36.0–46.0)
Hemoglobin: 9.2 g/dL — ABNORMAL LOW (ref 12.0–15.0)
MCH: 31 pg (ref 26.0–34.0)
MCHC: 33.3 g/dL (ref 30.0–36.0)
MCV: 92.9 fL (ref 78.0–100.0)
Platelets: 163 10*3/uL (ref 150–400)
RBC: 2.97 MIL/uL — AB (ref 3.87–5.11)
RDW: 19.8 % — ABNORMAL HIGH (ref 11.5–15.5)
WBC: 7.2 10*3/uL (ref 4.0–10.5)

## 2018-05-24 LAB — CBC WITH DIFFERENTIAL/PLATELET
BASOS PCT: 0 %
Basophils Absolute: 0 10*3/uL (ref 0.0–0.1)
EOS ABS: 0.1 10*3/uL (ref 0.0–0.7)
Eosinophils Relative: 2 %
HCT: 27.8 % — ABNORMAL LOW (ref 36.0–46.0)
HEMOGLOBIN: 9.2 g/dL — AB (ref 12.0–15.0)
LYMPHS ABS: 1.1 10*3/uL (ref 0.7–4.0)
Lymphocytes Relative: 13 %
MCH: 31.2 pg (ref 26.0–34.0)
MCHC: 33.1 g/dL (ref 30.0–36.0)
MCV: 94.2 fL (ref 78.0–100.0)
Monocytes Absolute: 0.3 10*3/uL (ref 0.1–1.0)
Monocytes Relative: 4 %
Neutro Abs: 6.7 10*3/uL (ref 1.7–7.7)
Neutrophils Relative %: 81 %
Platelets: 155 10*3/uL (ref 150–400)
RBC: 2.95 MIL/uL — AB (ref 3.87–5.11)
RDW: 20.7 % — ABNORMAL HIGH (ref 11.5–15.5)
WBC: 8.2 10*3/uL (ref 4.0–10.5)

## 2018-05-24 SURGERY — ENTEROSCOPY
Anesthesia: Monitor Anesthesia Care

## 2018-05-24 MED ORDER — PROPOFOL 10 MG/ML IV BOLUS
INTRAVENOUS | Status: AC
  Filled 2018-05-24: qty 40

## 2018-05-24 MED ORDER — LACTATED RINGERS IV SOLN
INTRAVENOUS | Status: DC
  Administered 2018-05-24: 12:00:00 via INTRAVENOUS

## 2018-05-24 MED ORDER — SODIUM CHLORIDE 0.9 % IJ SOLN
PREFILLED_SYRINGE | INTRAMUSCULAR | Status: DC | PRN
  Administered 2018-05-24: 3 mL

## 2018-05-24 MED ORDER — LIDOCAINE 2% (20 MG/ML) 5 ML SYRINGE
INTRAMUSCULAR | Status: DC | PRN
  Administered 2018-05-24: 60 mg via INTRAVENOUS

## 2018-05-24 MED ORDER — PROPOFOL 500 MG/50ML IV EMUL
INTRAVENOUS | Status: DC | PRN
  Administered 2018-05-24: 150 ug/kg/min via INTRAVENOUS

## 2018-05-24 MED ORDER — PROPOFOL 10 MG/ML IV BOLUS
INTRAVENOUS | Status: DC | PRN
  Administered 2018-05-24 (×2): 10 mg via INTRAVENOUS

## 2018-05-24 MED ORDER — EPINEPHRINE PF 1 MG/10ML IJ SOSY
PREFILLED_SYRINGE | INTRAMUSCULAR | Status: AC
  Filled 2018-05-24: qty 10

## 2018-05-24 MED ORDER — SODIUM CHLORIDE 0.9 % IV SOLN: INTRAVENOUS | Status: DC

## 2018-05-24 MED ORDER — PHENOL 1.4 % MT LIQD
1.0000 | OROMUCOSAL | Status: DC | PRN
  Filled 2018-05-24: qty 177

## 2018-05-24 MED ORDER — ONDANSETRON HCL 4 MG/2ML IJ SOLN
INTRAMUSCULAR | Status: DC | PRN
  Administered 2018-05-24: 4 mg via INTRAVENOUS

## 2018-05-24 NOTE — Anesthesia Procedure Notes (Signed)
Procedure Name: MAC Date/Time: 05/24/2018 1:56 PM Performed by: Maxwell Caul, CRNA Pre-anesthesia Checklist: Emergency Drugs available, Patient identified, Suction available and Patient being monitored Oxygen Delivery Method: Simple face mask

## 2018-05-24 NOTE — Progress Notes (Signed)
Initial Nutrition Assessment  DOCUMENTATION CODES:   Not applicable  INTERVENTION:   Continue Boost Breeze po TID, each supplement provides 250 kcal and 9 grams of protein  NUTRITION DIAGNOSIS:   Increased nutrient needs related to acute illness as evidenced by estimated needs.  GOAL:   Patient will meet greater than or equal to 90% of their needs  MONITOR:   PO intake, Supplement acceptance, Diet advancement, Weight trends, Labs  REASON FOR ASSESSMENT:   Malnutrition Screening Tool    ASSESSMENT:   Patient with PMH significant for CKD III, renal mass s/p radiofrequency ablation in 2012, pulmonary HTN from CREST syndrome, anemia from small bowel AVMs, and interstitial lung diease. Presents this admission with acute blood loss anemia from GI bleed. Pt scheduled for enteroscopy 8/5.    Pt denies having a loss in appetite PTA. States she followed a gluten free diet to help with her IBS symptoms. A typical day consist of: B- oatmeal, peanut butter toast, bacon; L- [place] or egg salad sandwich; D- meat, vegetable, grain (from a restaurant). Pt reports the quantity of food eaten at meal times has remained the same. She does not use supplementation at home. Pt is very active and goes to the gym three times/week. Discussed the importance of protein intake for preservation of lean body mass. Suggested pt add a protein supplement in her daily routine and discussed which options she can use at home. Pt is currently NPO for enteroscopy. She enjoys the [employer] and would like to continue them.   Pt endorses a UBW of 125 lb the last time being at that weight a year ago. Records show mostly stated weighs over the last year but show pt weighed 114 lb 01/19/18 and 113 lb this admission. Nutrition-Focused physical exam completed. Suspect some depletions are from advanced age.   Medications reviewed.  Labs reviewed.   NUTRITION - FOCUSED PHYSICAL EXAM:    Most Recent Value  Orbital Region   Mild depletion  Upper Arm Region  No depletion  Thoracic and Lumbar Region  Unable to assess  Buccal Region  No depletion  Temple Region  Moderate depletion  Clavicle Bone Region  Severe depletion  Clavicle and Acromion Bone Region  Severe depletion  Scapular Bone Region  Unable to assess  Dorsal Hand  Moderate depletion  Patellar Region  Moderate depletion  Anterior Thigh Region  Moderate depletion  Posterior Calf Region  Moderate depletion  Edema (RD Assessment)  None  Hair  Reviewed  Eyes  Reviewed  Mouth  Reviewed  Skin  Reviewed  Nails  Reviewed     Diet Order:   Diet Order           Diet NPO time specified  Diet effective now          EDUCATION NEEDS:   Education needs have been addressed  Skin:  Skin Assessment: Reviewed RN Assessment  Last BM:  05/23/18  Height:   Ht Readings from Last 1 Encounters:  05/23/18 _0  (1.626 m)    Weight:   Wt Readings from Last 1 Encounters:  05/23/18 113 lb 4.8 oz (51.4 kg)    Ideal Body Weight:  54.5 kg  BMI:  Body mass index is 19.45 kg/m.  Estimated Nutritional Needs:   Kcal:  1350-1550 kcal  Protein:  65-75 grams   Fluid:  >/= 1.3 L/day    Mariana Single RD, LDN Clinical Nutrition Pager # - [phone]

## 2018-05-24 NOTE — Anesthesia Preprocedure Evaluation (Signed)
Anesthesia Evaluation  Patient identified by MRN, date of birth, ID band Patient awake    Reviewed: Allergy & Precautions, NPO status , Patient's Chart, lab work & pertinent test results  History of Anesthesia Complications (+) PONV and history of anesthetic complications  Airway Mallampati: II  TM Distance: >3 FB Neck ROM: Full    Dental  (+) Dental Advisory Given, Caps, Implants   Pulmonary  Pulmonary hypertension,; interstitial lung disease   Pulmonary exam normal breath sounds clear to auscultation       Cardiovascular hypertension, Pt. on medications  Rhythm:Regular Rate:Normal + Systolic murmurs Echo 12/8379: NORMAL LEFT VENTRICULAR SYSTOLIC FUNCTION WITH MILD LVH NORMAL RIGHT VENTRICULAR SYSTOLIC FUNCTIONVALVULAR REGURGITATION: TRIVIAL MR, TRIVIAL PR, MILD TRNO VALVULAR STENOSIS LARGE PERICARDIAL EFFUSION (See above) ESTIMATED RVSP=12mHG LARGE PERICARDIAL EFFUSION WITH NO RESPIRATORY FLOW VARIATION AND NORMALIVC    Neuro/Psych negative neurological ROS     GI/Hepatic Neg liver ROS, GERD  Medicated,Gastric AVMs   Endo/Other  negative endocrine ROS  Renal/GU Renal InsufficiencyRenal disease     Musculoskeletal  (+) Arthritis , Osteoarthritis,    Abdominal Normal abdominal exam  (+)   Peds  Hematology  (+) Blood dyscrasia, anemia ,   Anesthesia Other Findings Day of surgery medications reviewed with the patient.  CREST syndrome   Reproductive/Obstetrics                             Anesthesia Physical  Anesthesia Plan  ASA: III  Anesthesia Plan: MAC   Post-op Pain Management:    Induction: Intravenous  PONV Risk Score and Plan:   Airway Management Planned: Nasal Cannula  Additional Equipment:   Intra-op Plan:   Post-operative Plan:   Informed Consent: I have reviewed the patients History and Physical, chart, labs and discussed the procedure including the  risks, benefits and alternatives for the proposed anesthesia with the patient or authorized representative who has indicated his/her understanding and acceptance.   Dental advisory given  Plan Discussed with: CRNA  Anesthesia Plan Comments:         Anesthesia Quick Evaluation

## 2018-05-24 NOTE — Op Note (Signed)
[facility] Patient Name: Paula Ward Procedure Date: 05/24/2018 MRN: [id number] Attending MD: Jackquline Denmark , MD Date of Birth: 1936-05-07 CSN: [id number] Age: 82 Admit Type: Inpatient Procedure:                Small bowel enteroscopy under fluoroscopy without                            overtube. Indications:              1. GI bleed: Melena x5 days, hemoglobin of 5.1 at                            admission--> 3 units PRBCs--> 9.2, history of                            gastric and small bowel AVMs last treated with APC                            in 2017, likely the cause                           2. Acute blood loss anemia: Last procedure March                            2017 with gastric and small bowel AVMs, likely the                            same Providers:                Jackquline Denmark, MD, Cleda Daub, RN, William Dalton, Technician Referring MD:              Medicines:                Monitored Anesthesia Care Complications:            No immediate complications. Estimated Blood Loss:     Estimated blood loss was minimal. Procedure:                Pre-Anesthesia Assessment:                           - Prior to the procedure, a History and Physical                            was performed, and patient medications and                            allergies were reviewed. The patient's tolerance of                            previous anesthesia was also reviewed. The risks                            and benefits  of the procedure and the sedation                            options and risks were discussed with the patient.                            All questions were answered, and informed consent                            was obtained. Prior Anticoagulants: The patient has                            taken no previous anticoagulant or antiplatelet                            agents. ASA Grade Assessment: III - A patient with                   severe systemic disease. After reviewing the risks                            and benefits, the patient was deemed in                            satisfactory condition to undergo the procedure.                           After obtaining informed consent, the endoscope was                            passed under direct vision. Throughout the                            procedure, the patient's blood pressure, pulse, and                            oxygen saturations were monitored continuously. The                            [id number]-[id number] ([id number] Olympus peds colonoscope was                            introduced through the mouth and advanced to the                            mid-jejunum. The small bowel enteroscopy was                            accomplished without difficulty. The patient                            tolerated the procedure well. Scope In: Scope Out: Findings:      A single actively bleeding angiodysplastic lesion/Dieulafoy's lesion was       found in the mid-jejunum with fresh blood and clots.  Area was       successfully injected with 3 mL of a 1:10,000 solution of epinephrine       for hemostasis. Coagulation for hemostasis using monopolar probe was       unsuccessful. To prevent bleeding post-intervention, two hemostatic       clips were successfully placed. There was no bleeding at the end of the       procedure.      A few 4-6 mm angiodysplastic lesions with no bleeding were found in the       third portion of the duodenum. Not treated.      A small hiatal hernia was present. Impression:               - A single bleeding angiodysplastic                            lesion/dieulafoy lesion in the duodenum. Injected                            s/p BICAP followed by endoscopic clipping.                           - A few non-bleeding angiodysplastic lesions in the                            duodenum.                           - Small hiatal hernia.                            - No specimens collected. Moderate Sedation:      none Recommendation:           - Return patient to hospital ward for ongoing care.                           - Clear liquid diet today.                           - Check hemoglobin q 8 hours for one day. Transfuse                            as needed.                           - Continue Protonix for now                           - Will follow along.                           - Avoid all NSAIDs. Procedure Code(s):        --- Professional ---                           [phone], Small intestinal endoscopy, enteroscopy  beyond second portion of duodenum, not including                            ileum; with control of bleeding (eg, injection,                            bipolar cautery, unipolar cautery, laser, heater                            probe, stapler, plasma coagulator) Diagnosis Code(s):        --- Professional ---                           K31.811, Angiodysplasia of stomach and duodenum                            with bleeding                           K44.9, Diaphragmatic hernia without obstruction or                            gangrene                           K92.1, Melena (includes Hematochezia) CPT copyright 2017 American Medical Association. All rights reserved. The codes documented in this report are preliminary and upon coder review may  be revised to meet current compliance requirements. Jackquline Denmark, MD 05/24/2018 2:55:16 PM This report has been signed electronically. Number of Addenda: 0

## 2018-05-24 NOTE — Consult Note (Addendum)
Consultation  Referring Provider: Dr. Tawanna Solo Primary Care Physician:  Hoyt Koch, MD Primary Gastroenterologist: Dr. Silverio Decamp       Reason for Consultation: Melena, acute blood loss anemia, history of AVM             HPI:   Paula Ward is a 82 y.o. 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    Today, explains that about 5 days ago she started passing very small what look like black stools but "I just carried on".  This continued 1-2 times a day until 3 days ago when she started getting slightly lightheaded, then yesterday she had a very good bowel movement in the morning which was black and sticky and " I knew what was going on".  She had another one at lunch and decided she needed to come to the ER.  Did have accompanying shortness of breath.  Denies abdominal pain.    Describes having a tooth pulled about 2 weeks ago and being put on Penicillin and Tylenol, she believes this "started things off".       Denies fever, chills, weight loss, anorexia, nausea, vomiting or symptoms that awaken her from sleep.  ER course: Hemoglobin 5.1--> transfused 3 units PRBCs--> 9.2 today  GI history: 01/19/2018 office visit Tye Savoy, NP: Discussed gagging; plan: At that time was describing coughing not regurgitation and she had no heartburn or dysphagia; Dr. 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   Past Medical History:  Diagnosis Date  . Anemia   . Angiodysplasia of stomach and duodenum   . Arthus phenomenon   . AVM (arteriovenous malformation)   . Blood transfusion without reported diagnosis    last 4 units 12-22-15, Iron infusion x2 last -01-07-16,01-14-16.  . Breast cancer ([place]) 1989   Left  . Candida esophagitis ([place])   . Cataract   . Chronic kidney disease    Chronic mild renal insuffiency  . CREST syndrome ([place])   . GERD (gastroesophageal reflux disease)    w/ HH  . Interstitial lung disease ([place])   . Nodule of kidney   . PONV (postoperative nausea and vomiting)   . Pulmonary embolus ([place]) 2003  . Pulmonary hypertension ([place])    followed by Dr Gaynell Face at [facility], now Dr. Maryjean Ka visit end 2'17.  . Rectal incontinence   . Renal cell carcinoma ([place])   . Scleroderma ([place])   . Tubular adenoma of colon   . Uterine polyp     Past Surgical History:  Procedure Laterality Date  . APPENDECTOMY  1962  . BREAST LUMPECTOMY  1989   left  . CATARACT EXTRACTION, BILATERAL Bilateral 12/2013  . ENTEROSCOPY N/A 01/18/2016   Procedure: ENTEROSCOPY;  Surgeon: Mauri Pole, MD;  Location: [facility];  Service: Endoscopy;  Laterality: N/A;  . ESOPHAGOGASTRODUODENOSCOPY (EGD) WITH PROPOFOL N/A 12/21/2015   Procedure: ESOPHAGOGASTRODUODENOSCOPY (EGD) WITH PROPOFOL;  Surgeon: Irene Shipper, MD;  Location: [facility];  Service: Endoscopy;  Laterality: N/A;  . IR GENERIC HISTORICAL  06/05/2016   IR RADIOLOGIST EVAL & MGMT 06/05/2016 Aletta Edouard, MD [facility] INTERV RAD  . IVC Filter    . KIDNEY SURGERY     left -"laser surgery by Dr. Kathlene Cote- 5 yrs ago" no removal  . TONSILLECTOMY    . TUBAL LIGATION      Family History  Problem Relation Age of  Onset  . Bladder Cancer Father   . Diabetes Father   . Prostate cancer Father   . Alzheimer's disease Mother   . Diabetes Sister   . Lung cancer Sister         smoker  . Esophageal cancer Paternal Uncle   . Colon cancer Neg Hx     Social History   Tobacco Use  . Smoking status: Never Smoker  . Smokeless tobacco: Never Used  Substance Use Topics  . Alcohol use: No  . Drug use: No    Prior to Admission medications   Medication Sig Start Date End Date Taking? Authorizing Provider  acetaminophen (TYLENOL) 325 MG tablet Take 650 mg by mouth every 6 (six) hours as needed for mild pain.    Yes [provider]  albuterol (PROVENTIL HFA;VENTOLIN HFA) 108 (90 BASE) MCG/ACT inhaler Inhale 2 puffs into the lungs every 6 (six) hours as needed for wheezing or shortness of breath. 08/31/15  Yes Hoyt Koch, MD  ALPRAZolam Duanne Moron) 0.25 MG tablet Take 0.5 tablets (0.125 mg total) by mouth at bedtime as needed. 12/03/16  Yes Hoyt Koch, MD  Cholecalciferol (VITAMIN D-1000 MAX ST) 1000 units tablet Take 3,000 Units by mouth daily.   Yes [provider]  diltiazem (CARDIZEM CD) 120 MG 24 hr capsule Take 120 mg by mouth daily.     Yes [provider]  fluticasone (FLONASE) 50 MCG/ACT nasal spray instill 1 spray into each nostril twice a day if needed 11/05/17  Yes Hoyt Koch, MD  Mouthwashes (DRY MOUTH MOUTHWASH) LIQD Use as directed 5 mLs in the mouth or throat as needed (dry mouth). 02/15/18  Yes Hoyt Koch, MD  Multiple Vitamin (MULTIVITAMIN WITH MINERALS) TABS tablet Take 1 tablet by mouth daily. NO IRON   Yes [provider]  omeprazole (PRILOSEC) 40 MG capsule TAKE 1 CAPSULE BY MOUTH TWICE A DAY 04/28/18  Yes Nandigam, Kavitha V, MD  OPSUMIT 10 MG TABS Take 1 tablet by mouth daily.  08/16/15  Yes [provider]  meclizine (ANTIVERT) 12.5 MG tablet Take 1-2 tablets (12.5-25 mg total) by mouth 3 (three) times daily  as needed for dizziness. Patient not taking: Reported on 05/23/2018 10/26/17   Binnie Rail, MD  montelukast (SINGULAIR) 10 MG tablet Take 1 tablet (10 mg total) by mouth at bedtime. Patient not taking: Reported on 05/23/2018 02/15/18   Hoyt Koch, MD  mupirocin cream (BACTROBAN) 2 % Apply 1 application topically 2 (two) times daily. Patient not taking: Reported on 05/23/2018 03/22/18   Binnie Rail, MD  pilocarpine (SALAGEN) 5 MG tablet Take 1 tablet (5 mg total) by mouth 3 (three) times daily as needed. Patient not taking: Reported on 05/23/2018 02/15/18   Hoyt Koch, MD    Current Facility-Administered Medications  Medication Dose Route Frequency Provider Last Rate Last Dose  .  acetaminophen (TYLENOL) tablet 650 mg  650 mg Oral Q6H PRN Adhikari, Amrit, MD      . albuterol (PROVENTIL) (2.5 MG/3ML) 0.083% nebulizer solution 2.5 mg  2.5 mg Nebulization Q6H PRN Adhikari, Amrit, MD      . ALPRAZolam Duanne Moron) tablet 0.125 mg  0.125 mg Oral QHS PRN Adhikari, Amrit, MD      . diphenhydrAMINE-zinc acetate (BENADRYL) 2-0.1 % cream   Topical TID PRN Blount, Scarlette Shorts T, NP      . feeding supplement (BOOST / RESOURCE BREEZE) liquid 1 Container  1 Container Oral TID BM Shelly Coss, MD   1 Container at 05/23/18 1950  . macitentan (OPSUMIT) tablet 10 mg  10 mg Oral Daily Adhikari, Amrit, MD      . pantoprazole (PROTONIX) EC tablet 40 mg  40 mg Oral BID Shelly Coss, MD      . pilocarpine (SALAGEN) tablet 5 mg  5 mg Oral TID PRN Shelly Coss, MD        Allergies as of 05/23/2018 - Review Complete 05/23/2018  Allergen Reaction Noted  . Codeine Nausea Only 08/03/2008  . Other Nausea And Vomiting 04/05/2012  . Erythromycin Nausea And Vomiting 10/17/2014  . Lisinopril  12/14/2014     Review of Systems:    Constitutional: No weight loss, fever or chills Skin: No rash  Cardiovascular: No chest pain Respiratory: No SOB  Gastrointestinal: See HPI and otherwise negative Genitourinary:  No dysuria  Neurological: +dizziness Musculoskeletal: No new muscle or joint pain Hematologic: No bruising Psychiatric: No history of depression or anxiety    Physical Exam:  Vital signs in last 24 hours: Temp:  [97.8 F (36.6 C)-99.2 F (37.3 C)] 98.4 F (36.9 C) (08/05 0507) Pulse Rate:  [81-103] 93 (08/05 0507) Resp:  [16-30] 20 (08/05 0507) BP: (102-129)/(39-56) 122/56 (08/05 0507) SpO2:  [80 %-100 %] 95 % (08/05 0507) Weight:  [113 lb 4.8 oz (51.4 kg)-114 lb (51.7 kg)] 113 lb 4.8 oz (51.4 kg) (08/04 1700) Last BM Date: 05/23/18 General:   Pleasant frail-appearing, elderly Caucasian female appears to be in NAD, Well developed, Well nourished, alert and cooperative Head:  Normocephalic and atraumatic. Eyes:   PEERL, EOMI. No icterus. Conjunctiva pink. Ears:  Normal auditory acuity. Neck:  Supple Throat: Oral cavity and pharynx without inflammation, swelling or lesion.  Lungs: Respirations even and unlabored. Lungs clear to auscultation bilaterally.   No wheezes, crackles, or rhonchi.  Heart: Normal S1, S2. No MRG. Regular rate and rhythm. No peripheral edema, cyanosis or pallor.  Abdomen:  Soft, nondistended, nontender. No rebound or guarding. Normal bowel sounds. No appreciable masses or hepatomegaly. Rectal:  Not performed.  Msk:  Symmetrical without gross deformities. Peripheral pulses intact.  Extremities:  Without edema, no deformity or joint abnormality. Normal ROM, normal sensation. Neurologic:  Alert and  oriented x4;  grossly normal neurologically.  Skin:   Dry and intact without significant lesions or rashes. Psychiatric: Demonstrates good judgement and reason without abnormal affect or behaviors.   LAB RESULTS: Recent Labs    05/23/18 1344 05/24/18 0652  WBC 7.8 7.2  HGB 5.1* 9.2*  HCT 16.3* 27.6*  PLT 199 163   BMET Recent Labs    05/23/18 1344 05/24/18 0652  NA 138 141  K 4.6 4.4  CL 105 108  CO2 24 26  GLUCOSE 105* 89  BUN 60* 42*  CREATININE  1.27* 0.94  CALCIUM 8.9 8.6*   LFT Recent Labs    05/23/18 1344  PROT 5.6*  ALBUMIN 3.4*  AST 22  ALT 14  ALKPHOS 35*  BILITOT 0.4   STUDIES: Dg Chest Port 1 View  Result Date: 05/23/2018 CLINICAL DATA:  Shortness of breath EXAM: PORTABLE CHEST 1 VIEW COMPARISON:  09/17/2016 FINDINGS: Chronic interstitial markings. Radiation changes in the left upper lobe. Subpleural reticulation/scarring in the lungs bilaterally, particularly in the right upper lobe. Mild left basilar opacity, likely scarring/atelectasis. No focal consolidation. No definite pleural effusions. Mild eventration of the left hemidiaphragm. No pneumothorax. Cardiomegaly. Postsurgical changes in the left breast and left axilla. IMPRESSION: Chronic interstitial markings with postsurgical and suspected post radiation changes in the left hemithorax. No evidence of acute cardiopulmonary disease. Electronically Signed   By: Julian Hy M.D.   On: 05/23/2018 15:18    Impression / Plan:   Impression: 1.  GI bleed: Melena x5 days, hemoglobin of 5.1 at admission--> 3 units PRBCs--> 9.2, history of gastric and small bowel AVMs last treated with APC in 2017, likely the cause  2.  Acute blood loss anemia: Last procedure March 2017 with gastric and small bowel AVMs, likely the same 3.  AKI on CKD stage III: improved overnight 4.  History of ILD/crest syndrome/scleroderma: Follows at [facility] 4.  Pulmonary hypertension  Plan: 1.  Schedule patient for enteroscopy today with Dr. Lyndel Safe.  Did review risks, benefits, limitations and alternatives and the patient agrees to proceed. 2.  Continue supportive measures with monitoring of hemoglobin and transfusion less than 7 3.  Patient will remain n.p.o. until after time of procedure today. 4.  Please await any further recommendations from Dr. Lyndel Safe later today  Thank you for your kind consultation, we will continue to follow.  Lavone Nian [facility]  05/24/2018, 9:49 AM   Attending  physician's note   I have taken an interval history, reviewed the chart and examined the patient. I agree with the Advanced Practitioner's note, impression and recommendations.   82 year old with CKD, pulmonary hypertension from CREST syndrome with recurrent GI bleeding due to multiple small bowel AVMs status post multiple ablations, presented with melena with severe anemia with Hb 5.1 s/p 3U to Hb 9.2. Plan: Rpt Enteroscopy with ablation today. Will use flouro. If recurrent problems, may need repeat Capsule Endoscopy followed by [facility].  Carmell Austria, MD

## 2018-05-24 NOTE — Anesthesia Postprocedure Evaluation (Signed)
Anesthesia Post Note  Patient: Paula Ward  Procedure(s) Performed: ENTEROSCOPY (N/A ) SCHLEROTHERAPY OF VARICES HOT HEMOSTASIS (ARGON PLASMA COAGULATION/BICAP) (N/A )     Patient location during evaluation: Endoscopy Anesthesia Type: MAC Level of consciousness: awake Pain management: pain level controlled Vital Signs Assessment: post-procedure vital signs reviewed and stable Respiratory status: spontaneous breathing Cardiovascular status: stable Postop Assessment: no apparent nausea or vomiting Anesthetic complications: no    Last Vitals:  Vitals:   05/24/18 1440 05/24/18 1450  BP: (!) 163/50 (!) 157/49  Pulse: 81 83  Resp: (!) 21 (!) 21  Temp: 36.6 C   SpO2: 95% 97%    Last Pain:  Vitals:   05/24/18 1440  TempSrc: Oral  PainSc: 0-No pain   Pain Goal:                 Bj Morlock JR,JOHN Amye Grego

## 2018-05-24 NOTE — Transfer of Care (Signed)
Immediate Anesthesia Transfer of Care Note  Patient: Paula Ward  Procedure(s) Performed: ENTEROSCOPY (N/A ) SCHLEROTHERAPY OF VARICES HOT HEMOSTASIS (ARGON PLASMA COAGULATION/BICAP) (N/A )  Patient Location: PACU  Anesthesia Type:MAC  Level of Consciousness: awake, alert  and oriented  Airway & Oxygen Therapy: Patient Spontanous Breathing and Patient connected to nasal cannula oxygen  Post-op Assessment: Report given to RN and Post -op Vital signs reviewed and stable  Post vital signs: Reviewed and stable  Last Vitals:  Vitals Value Taken Time  BP    Temp    Pulse 84 05/24/2018  2:38 PM  Resp 18 05/24/2018  2:38 PM  SpO2 92 % 05/24/2018  2:38 PM  Vitals shown include unvalidated device data.  Last Pain:  Vitals:   05/24/18 1155  TempSrc: Oral  PainSc: 0-No pain         Complications: No apparent anesthesia complications

## 2018-05-24 NOTE — Progress Notes (Signed)
PROGRESS NOTE    Paula Ward  RJJ:[id number] DOB: [birth year] DOA: 05/23/2018 PCP: Hoyt Koch, MD   Brief Narrative:  Paula Ward is a 82 y.o. female with medical history significant of GI bleed, anemia, crest syndrome, pulmonary hypertension, interstitial lung disease , CKD stage III who presents to med Center at [facility] with complaints of black tarry stools since last Wednesday.  Patient reported that she went to her PCPs office on last Tuesday.  Her hemoglobin usually stays around 11 and did notice a drop of hemoglobin to 10 at the time. Patient has been having black tarry stools since Wednesday and she has increasingly became short of breath and weak.  She presented to the [facility] for further evaluation.  Her hemoglobin was found to be 5.1.  Stool occult blood was positive. GI consulted.  Undergoing EGD today.  Assessment & Plan:   Principal Problem:   GI bleed Active Problems:   Anemia, iron deficiency   Essential hypertension   Pulmonary hypertension (HCC)   Angiodysplasia of stomach and duodenum   SCLERODERMA   ILD (interstitial lung disease) (HCC)   CKD (chronic kidney disease) stage 3, GFR 30-59 ml/min (HCC)   Acute blood loss anemia: Secondary to GI bleed.  Transfused her with 3 units of PRBC with improvement in hemoglobin to 9.2 .Patient is hemodynamically stable..  GI bleed: Most likely secondary to AVMs.  She has history of AVMs as per past  EGD/ colonoscopy report done about 2 and  half years ago.  Patient did not report of any intake of aspirin or NSAIDs.  Reported multiple black tarry stools at home. Undergoing EGD /coloscopy today.  History of hypertension: Currently blood pressure soft.  We will continue to monitor.  Will hold Cardizem.  AKI on  CKD stage III: Elevated BUN and creatinine from baseline.  Improved today.  History of ILD/crest syndrome/scleroderma: Follows at [facility].  Has bilateral crackles which is a chronic  finding for her.    Pulmonary hypertension: Secondary to above.  Follows with pulmonary at [facility].  On Opsumit(Macitentan).  Also on Cardizem.  Currently her respiratory status stable.  Continue supplemental oxygen as needed.  Not on oxygen at home.  Anxiety: Continue Xanax as needed  DVT prophylaxis: SCD Code Status: Full Family Communication: None present at the bedside Disposition Plan: Likely home tomorrow   Consultants: GI  Procedures: None  Antimicrobials: None   Subjective:  Patient seen and examined the bedside this morning.  Remains comfortable.  No new issues.  Hemodynamically stable.  Objective: Vitals:   05/24/18 0246 05/24/18 0507 05/24/18 1130 05/24/18 1155  BP: (!) 129/50 (!) 122/56 (!) 121/52 (!) 140/56  Pulse: 86 93 85 80  Resp: (!) 26 20 (!) 22 (!) 22  Temp: 99.2 F (37.3 C) 98.4 F (36.9 C) 98.6 F (37 C) 99.2 F (37.3 C)  TempSrc: Oral Oral Oral Oral  SpO2: 95% 95% 96% 92%  Weight:      Height:        Intake/Output Summary (Last 24 hours) at 05/24/2018 1308 Last data filed at 05/24/2018 0160 Gross per 24 hour  Intake 988.33 ml  Output 2250 ml  Net -1261.67 ml   Filed Weights   05/23/18 1316 05/23/18 1700  Weight: 51.7 kg (114 lb) 51.4 kg (113 lb 4.8 oz)    Examination:  General exam: Appears calm and comfortable ,Not in distress,elderly female HEENT:PERRL,Oral mucosa moist, Ear/Nose normal on gross exam Respiratory system: Bilateral  basal crackles Cardiovascular system: S1 & S2 heard, RRR. No JVD, murmurs, rubs, gallops or clicks. No pedal edema. Gastrointestinal system: Abdomen is nondistended, soft and nontender. No organomegaly or masses felt. Normal bowel sounds heard. Central nervous system: Alert and oriented. No focal neurological deficits. Extremities: No edema, no clubbing ,no cyanosis, distal peripheral pulses palpable. Skin: No rashes, lesions or ulcers,no icterus ,no pallor MSK: Normal muscle bulk,tone ,power Psychiatry:  Judgement and insight appear normal. Mood & affect appropriate.     Data Reviewed: I have personally reviewed following labs and imaging studies  CBC: Recent Labs  Lab 05/18/18 1627 05/23/18 1344 05/24/18 0652  WBC 7.7 7.8 7.2  HGB 10.1* 5.1* 9.2*  HCT 29.6* 16.3* 27.6*  MCV 102.3* 108.7* 92.9  PLT 221.0 199 241   Basic Metabolic Panel: Recent Labs  Lab 05/18/18 1627 05/23/18 1344 05/24/18 0652  NA 137 138 141  K 4.1 4.6 4.4  CL 102 105 108  CO2 _0 GLUCOSE 114* 105* 89  BUN 30* 60* 42*  CREATININE 1.15 1.27* 0.94  CALCIUM 8.9 8.9 8.6*   GFR: Estimated Creatinine Clearance: 39.4 mL/min (by C-G formula based on SCr of 0.94 mg/dL). Liver Function Tests: Recent Labs  Lab 05/18/18 1627 05/23/18 1344  AST 17 22  ALT 14 14  ALKPHOS 52 35*  BILITOT 0.4 0.4  PROT 6.4 5.6*  ALBUMIN 4.0 3.4*   No results for input(s): LIPASE, AMYLASE in the last 168 hours. No results for input(s): AMMONIA in the last 168 hours. Coagulation Profile: No results for input(s): INR, PROTIME in the last 168 hours. Cardiac Enzymes: No results for input(s): CKTOTAL, CKMB, CKMBINDEX, TROPONINI in the last 168 hours. BNP (last 3 results) No results for input(s): PROBNP in the last 8760 hours. HbA1C: No results for input(s): HGBA1C in the last 72 hours. CBG: No results for input(s): GLUCAP in the last 168 hours. Lipid Profile: No results for input(s): CHOL, HDL, LDLCALC, TRIG, CHOLHDL, LDLDIRECT in the last 72 hours. Thyroid Function Tests: No results for input(s): TSH, T4TOTAL, FREET4, T3FREE, THYROIDAB in the last 72 hours. Anemia Panel: No results for input(s): VITAMINB12, FOLATE, FERRITIN, TIBC, IRON, RETICCTPCT in the last 72 hours. Sepsis Labs: No results for input(s): PROCALCITON, LATICACIDVEN in the last 168 hours.  No results found for this or any previous visit (from the past 240 hour(s)).       Radiology Studies: Dg Chest Port 1 View  Result Date:  05/23/2018 CLINICAL DATA:  Shortness of breath EXAM: PORTABLE CHEST 1 VIEW COMPARISON:  09/17/2016 FINDINGS: Chronic interstitial markings. Radiation changes in the left upper lobe. Subpleural reticulation/scarring in the lungs bilaterally, particularly in the right upper lobe. Mild left basilar opacity, likely scarring/atelectasis. No focal consolidation. No definite pleural effusions. Mild eventration of the left hemidiaphragm. No pneumothorax. Cardiomegaly. Postsurgical changes in the left breast and left axilla. IMPRESSION: Chronic interstitial markings with postsurgical and suspected post radiation changes in the left hemithorax. No evidence of acute cardiopulmonary disease. Electronically Signed   By: Julian Hy M.D.   On: 05/23/2018 15:18        Scheduled Meds: . [MAR Hold] feeding supplement  1 Container Oral TID BM  . [MAR Hold] macitentan  10 mg Oral Daily  . [MAR Hold] pantoprazole  40 mg Oral BID   Continuous Infusions: . lactated ringers 20 mL/hr at 05/24/18 1201     LOS: 1 day    Time spent: 25 mins.More than 50% of that time was  spent in counseling and/or coordination of care.      Shelly Coss, MD [facility] Pager [phone]  If 7PM-7AM, please contact night-coverage [email or web address] Password [email or web address] 05/24/2018, 1:08 PM

## 2018-05-24 NOTE — Plan of Care (Signed)
Pt states she feels a little better ambulating to the BR halfway through her second unit  of blood.

## 2018-05-25 ENCOUNTER — Telehealth: Payer: Self-pay

## 2018-05-25 LAB — CBC WITH DIFFERENTIAL/PLATELET
BASOS ABS: 0 10*3/uL (ref 0.0–0.1)
Basophils Relative: 0 %
EOS ABS: 0.2 10*3/uL (ref 0.0–0.7)
EOS PCT: 3 %
HCT: 29.8 % — ABNORMAL LOW (ref 36.0–46.0)
Hemoglobin: 9.7 g/dL — ABNORMAL LOW (ref 12.0–15.0)
LYMPHS PCT: 15 %
Lymphs Abs: 1.1 10*3/uL (ref 0.7–4.0)
MCH: 31.1 pg (ref 26.0–34.0)
MCHC: 32.6 g/dL (ref 30.0–36.0)
MCV: 95.5 fL (ref 78.0–100.0)
Monocytes Absolute: 0.6 10*3/uL (ref 0.1–1.0)
Monocytes Relative: 7 %
Neutro Abs: 5.7 10*3/uL (ref 1.7–7.7)
Neutrophils Relative %: 75 %
PLATELETS: 172 10*3/uL (ref 150–400)
RBC: 3.12 MIL/uL — AB (ref 3.87–5.11)
RDW: 20.6 % — ABNORMAL HIGH (ref 11.5–15.5)
WBC: 7.6 10*3/uL (ref 4.0–10.5)

## 2018-05-25 LAB — TYPE AND SCREEN
ABO/RH(D): A POS
Antibody Screen: NEGATIVE
UNIT DIVISION: 0
Unit division: 0
Unit division: 0

## 2018-05-25 LAB — BPAM RBC
BLOOD PRODUCT EXPIRATION DATE: 201908292359
BLOOD PRODUCT EXPIRATION DATE: 201908292359
Blood Product Expiration Date: 201908292359
ISSUE DATE / TIME: 201908042347
ISSUE DATE / TIME: 201908050227
ISSUE DATE / TIME: 201908042116
UNIT TYPE AND RH: 6200
Unit Type and Rh: 6200
Unit Type and Rh: 6200

## 2018-05-25 MED ORDER — OMEPRAZOLE 40 MG PO CPDR: 40.0000 mg | DELAYED_RELEASE_CAPSULE | Freq: Two times a day (BID) | ORAL | 0 refills | Status: DC

## 2018-05-25 NOTE — Plan of Care (Signed)
  Problem: Bowel/Gastric: Goal: Will show no signs and symptoms of gastrointestinal bleeding Outcome: Progressing   Problem: Activity: Goal: Risk for activity intolerance will decrease Outcome: Progressing   Problem: Nutrition: Goal: Adequate nutrition will be maintained Outcome: Progressing

## 2018-05-25 NOTE — Telephone Encounter (Signed)
Spoke with patient, she is no longer on Protonix, explained to her that the hospital doctor sent in the omeprazole twice daily for her to take and for her to take that until she is seen in this office

## 2018-05-25 NOTE — Telephone Encounter (Signed)
appt made with Ellouise Newer for 06/15/18 3:15 am pt to be notified at discharge.

## 2018-05-25 NOTE — Discharge Summary (Addendum)
Physician Discharge Summary  Paula Ward:[id number] DOB: 04-Feb-1936 DOA: 05/23/2018  PCP: Hoyt Koch, MD  Admit date: 05/23/2018 Discharge date: 05/25/2018  Admitted From: Home Disposition:  Home  Discharge Condition:Stable CODE STATUS:FULL Diet recommendation: Heart Healthy   Brief/Interim Summary:  Paula Ward a 82 y.o.femalewith medical history significant ofGI bleed, anemia, crest syndrome, pulmonary hypertension, interstitial lung disease,CKD stage III who presents to med Center at [facility] with complaints of black tarry stools since last Wednesday. Patient reported that she went to her PCPs office on last Tuesday. Her hemoglobin usually stays around 11 and did notice a drop of hemoglobin to 10 at the time. Patient has been having black tarry stools since Wednesday and she has increasingly became short of breath and weak.She presented to the [place] for further evaluation. Her hemoglobin was found to be 5.1. Stool occult blood was positive. GI consulted.  She underwent EGD which showed a single bleeding angiodysplastic lesion in the duodenum.  Clipping procedure done.  Since the procedure, her hemoglobin has been stable.  She is  hemodynamically stable. GI has cleared her for discharge today to home.  She will follow-up with her PCP and GI as an outpatient.    Following problems were addressed during her hospitalization:  Acute blood loss anemia: Secondary to GI bleed. Transfused her with 3 units of PRBC with improvement in hemoglobin to 9.2 .Patient is hemodynamically stable.  H&H has remained stable.  GI bleed: She underwent EGD which showed a single bleeding angiodysplastic lesion in the duodenum.  Clipping procedure done. She has history of AVMs as per pastEGD/ colonoscopy reportdone about2 and  half years ago. Patient did not report of any intake of aspirin or NSAIDs. Reported multiple black tarry stools at home.  History  of hypertension: Resume home medications  AKI onCKD stage IPP:GFQMKJIZ BUN and creatinine from baseline.Improved .  History of ILD/crest syndrome/scleroderma/chronic respiratory insufficiency: Follows at [facility]. Has bilateral crackles which is a chronic finding for her.She was not on home oxygen but she has qualified for home oxygen during the hospitalization.  Pulmonary hypertension:Secondary to above. Follows with pulmonary at [facility]. On Opsumit(Macitentan).Also on Cardizem. Currently her respiratory status stable. Not on oxygen at home.  Anxiety: Continue Xanax as needed     Discharge Diagnoses:  Principal Problem:   GI bleed Active Problems:   Anemia, iron deficiency   Essential hypertension   Pulmonary hypertension (HCC)   Angiodysplasia of stomach and duodenum   SCLERODERMA   ILD (interstitial lung disease) (HCC)   CKD (chronic kidney disease) stage 3, GFR 30-59 ml/min (HCC)   Acute GI bleeding    Discharge Instructions  Discharge Instructions    Diet - low sodium heart healthy   Complete by:  As directed    Discharge instructions   Complete by:  As directed    1) Follow up with your PCP in a week and do a CBC test to check for your hemoglobin. 2) Follow up with gastroenterology as an outpatient. 3)Take prescribed medications as instructed. Avoid aspirin or NSAIDs.   Increase activity slowly   Complete by:  As directed      Allergies as of 05/25/2018      Reactions   Codeine Nausea Only   Hallucinations, too   Other Nausea And Vomiting   "-mycin" antibiotics.   Also cause hallucinations.   Erythromycin Nausea And Vomiting   Lisinopril    Pt doesn't remember  Medication List    TAKE these medications   acetaminophen 325 MG tablet Commonly known as:  TYLENOL Take 650 mg by mouth every 6 (six) hours as needed for mild pain.   albuterol 108 (90 Base) MCG/ACT inhaler Commonly known as:  PROVENTIL HFA;VENTOLIN HFA Inhale 2 puffs into the  lungs every 6 (six) hours as needed for wheezing or shortness of breath.   ALPRAZolam 0.25 MG tablet Commonly known as:  XANAX Take 0.5 tablets (0.125 mg total) by mouth at bedtime as needed.   diltiazem 120 MG 24 hr capsule Commonly known as:  CARDIZEM CD Take 120 mg by mouth daily.   DRY MOUTH MOUTHWASH Liqd Use as directed 5 mLs in the mouth or throat as needed (dry mouth).   fluticasone 50 MCG/ACT nasal spray Commonly known as:  FLONASE instill 1 spray into each nostril twice a day if needed   meclizine 12.5 MG tablet Commonly known as:  ANTIVERT Take 1-2 tablets (12.5-25 mg total) by mouth 3 (three) times daily as needed for dizziness.   montelukast 10 MG tablet Commonly known as:  SINGULAIR Take 1 tablet (10 mg total) by mouth at bedtime.   multivitamin with minerals Tabs tablet Take 1 tablet by mouth daily. NO IRON   mupirocin cream 2 % Commonly known as:  BACTROBAN Apply 1 application topically 2 (two) times daily.   omeprazole 40 MG capsule Commonly known as:  PRILOSEC Take 1 capsule (40 mg total) by mouth 2 (two) times daily.   OPSUMIT 10 MG tablet Generic drug:  macitentan Take 1 tablet by mouth daily.   pilocarpine 5 MG tablet Commonly known as:  SALAGEN Take 1 tablet (5 mg total) by mouth 3 (three) times daily as needed.   VITAMIN D-1000 MAX ST 1000 units tablet Generic drug:  Cholecalciferol Take 3,000 Units by mouth daily.      Follow-up Information    Hoyt Koch, MD. Schedule an appointment as soon as possible for a visit in 1 week(s).   Specialty:  Internal Medicine Contact information: [place] [phone]          Allergies  Allergen Reactions  . Codeine Nausea Only    Hallucinations, too  . Other Nausea And Vomiting    "-mycin" antibiotics.   Also cause hallucinations.  . Erythromycin Nausea And Vomiting  . Lisinopril     Pt doesn't remember      Consultations: GI  Procedures/Studies: Dg Chest Port 1 View  Result Date: 05/23/2018 CLINICAL DATA:  Shortness of breath EXAM: PORTABLE CHEST 1 VIEW COMPARISON:  09/17/2016 FINDINGS: Chronic interstitial markings. Radiation changes in the left upper lobe. Subpleural reticulation/scarring in the lungs bilaterally, particularly in the right upper lobe. Mild left basilar opacity, likely scarring/atelectasis. No focal consolidation. No definite pleural effusions. Mild eventration of the left hemidiaphragm. No pneumothorax. Cardiomegaly. Postsurgical changes in the left breast and left axilla. IMPRESSION: Chronic interstitial markings with postsurgical and suspected post radiation changes in the left hemithorax. No evidence of acute cardiopulmonary disease. Electronically Signed   By: Julian Hy M.D.   On: 05/23/2018 15:18   Dg C-arm 1-60 Min  Result Date: 05/24/2018 CLINICAL DATA:  Enterostomy EXAM: DG C-ARM 61-120 MIN COMPARISON:  None. FLUOROSCOPY TIME:  13 seconds, 1.36 mGy FINDINGS: Two fluoroscopic spot images from enterostomy. IMPRESSION: Intraoperative localization Electronically Signed   By: Kathreen Devoid   On: 05/24/2018 14:50       Subjective: Seen and examined the  pressure this morning.  Remains comfortable.  No new issues/events.  Hemoglobin stable.  Stable for discharge home today.  Discharge Exam: Vitals:   05/24/18 2116 05/25/18 0442  BP: (!) 118/45 (!) 111/48  Pulse: 92 91  Resp: 18 18  Temp: 98.5 F (36.9 C) 98.4 F (36.9 C)  SpO2: 93% 94%   Vitals:   05/24/18 1500 05/24/18 1521 05/24/18 2116 05/25/18 0442  BP: (!) 163/52 (!) 140/57 (!) 118/45 (!) 111/48  Pulse: 86 83 92 91  Resp: _0 Temp:  97.9 F (36.6 C) 98.5 F (36.9 C) 98.4 F (36.9 C)  TempSrc:  Oral Oral Oral  SpO2: 96% 97% 93% 94%  Weight:      Height:        General: Pt is alert, awake, not in acute distress Cardiovascular: RRR, S1/S2 +, no rubs, no gallops Respiratory: CTA  bilaterally, no wheezing, no rhonchi Abdominal: Soft, NT, ND, bowel sounds + Extremities: no edema, no cyanosis    The results of significant diagnostics from this hospitalization (including imaging, microbiology, ancillary and laboratory) are listed below for reference.     Microbiology: No results found for this or any previous visit (from the past 240 hour(s)).   Labs: BNP (last 3 results) No results for input(s): BNP in the last 8760 hours. Basic Metabolic Panel: Recent Labs  Lab 05/18/18 1627 05/23/18 1344 05/24/18 0652  NA 137 138 141  K 4.1 4.6 4.4  CL 102 105 108  CO2 _1 GLUCOSE 114* 105* 89  BUN 30* 60* 42*  CREATININE 1.15 1.27* 0.94  CALCIUM 8.9 8.9 8.6*   Liver Function Tests: Recent Labs  Lab 05/18/18 1627 05/23/18 1344  AST 17 22  ALT 14 14  ALKPHOS 52 35*  BILITOT 0.4 0.4  PROT 6.4 5.6*  ALBUMIN 4.0 3.4*   No results for input(s): LIPASE, AMYLASE in the last 168 hours. No results for input(s): AMMONIA in the last 168 hours. CBC: Recent Labs  Lab 05/18/18 1627 05/23/18 1344 05/24/18 0652 05/24/18 1939 05/25/18 0436  WBC 7.7 7.8 7.2 8.2 7.6  NEUTROABS  --   --   --  6.7 5.7  HGB 10.1* 5.1* 9.2* 9.2* 9.7*  HCT 29.6* 16.3* 27.6* 27.8* 29.8*  MCV 102.3* 108.7* 92.9 94.2 95.5  PLT 221.0 199 163 155 172   Cardiac Enzymes: No results for input(s): CKTOTAL, CKMB, CKMBINDEX, TROPONINI in the last 168 hours. BNP: Invalid input(s): POCBNP CBG: No results for input(s): GLUCAP in the last 168 hours. D-Dimer No results for input(s): DDIMER in the last 72 hours. Hgb A1c No results for input(s): HGBA1C in the last 72 hours. Lipid Profile No results for input(s): CHOL, HDL, LDLCALC, TRIG, CHOLHDL, LDLDIRECT in the last 72 hours. Thyroid function studies No results for input(s): TSH, T4TOTAL, T3FREE, THYROIDAB in the last 72 hours.  Invalid input(s): FREET3 Anemia work up No results for input(s): VITAMINB12, FOLATE, FERRITIN, TIBC, IRON,  RETICCTPCT in the last 72 hours. Urinalysis    Component Value Date/Time   COLORURINE YELLOW 06/19/2017 1707   APPEARANCEUR CLEAR 06/19/2017 1707   LABSPEC 1.020 06/19/2017 1707   PHURINE 6.0 06/19/2017 1707   GLUCOSEU NEGATIVE 06/19/2017 1707   HGBUR NEGATIVE 06/19/2017 1707   BILIRUBINUR Neg 09/03/2017 [place] 06/19/2017 1707   PROTEINUR 30 09/03/2017 1529   PROTEINUR NEGATIVE 12/24/2015 2233   UROBILINOGEN 0.2 09/03/2017 1529   UROBILINOGEN 0.2 06/19/2017 1707   NITRITE Neg 09/03/2017  [place] 06/19/2017 1707   LEUKOCYTESUR Negative 09/03/2017 1529   Sepsis Labs Invalid input(s): PROCALCITONIN,  WBC,  LACTICIDVEN Microbiology No results found for this or any previous visit (from the past 240 hour(s)).  Please note: You were cared for by a hospitalist during your hospital stay. Once you are discharged, your primary care physician will handle any further medical issues. Please note that NO REFILLS for any discharge medications will be authorized once you are discharged, as it is imperative that you return to your primary care physician (or establish a relationship with a primary care physician if you do not have one) for your post hospital discharge needs so that they can reassess your need for medications and monitor your lab values.    Time coordinating discharge: 40 minutes  SIGNED:   Shelly Coss, MD  [facility] 05/25/2018, 11:20 AM Pager [phone]  If 7PM-7AM, please contact night-coverage [email or web address] Password [email or web address]

## 2018-05-25 NOTE — Telephone Encounter (Signed)
-----  Message from [place], [place] sent at 05/25/2018  9:39 AM EDT ----- Regarding: Needs follow up Follow up with me in 2-3 weeks. Or Dr. Silverio Decamp. Will need to be called with appt, likely being discharged this agfternoon.  Thanks-JLL

## 2018-05-25 NOTE — Progress Notes (Signed)
05/25/18  1400  Reviewed discharge instructions with patient. Patient verbalized understanding of discharge instructions. Copy of discharge instructions given to patient. Pt O2 while laying in bed was 87%RA, once she sat on the side of the bed O2 94%RA, walked patient in hallway O2 92%RA. Patient refused to take home oxygen tank although MD recommended that she may need home O2.

## 2018-05-25 NOTE — Progress Notes (Addendum)
Progress Note   Subjective  Chief Complaint: GI bleed, melena  Today, reports that she had a rough night because she developed a "burning on my forehead".  Apparently just got to sleep a few minutes ago.  Denies having a bowel movement since time of procedure.  No new complaints today.   Objective   Vital signs in last 24 hours: Temp:  [97.8 F (36.6 C)-99.2 F (37.3 C)] 98.4 F (36.9 C) (08/06 0442) Pulse Rate:  [80-92] 91 (08/06 0442) Resp:  [18-22] 18 (08/06 0442) BP: (111-163)/(45-57) 111/48 (08/06 0442) SpO2:  [92 %-97 %] 94 % (08/06 0442) Last BM Date: 05/24/18 General:    Elderly white female in NAD Heart:  Regular rate and rhythm; no murmurs Lungs: Respirations even and unlabored, lungs CTA bilaterally Abdomen:  Soft, nontender and nondistended. Normal bowel sounds. Extremities:  Without edema. Neurologic:  Alert and oriented,  grossly normal neurologically. Psych:  Cooperative. Normal mood and affect.  Intake/Output from previous day: 08/05 0701 - 08/06 0700 In: 1020 [P.O.:720; I.V.:300] Out: 500 [Urine:500]  Lab Results: Recent Labs    05/24/18 0652 05/24/18 1939 05/25/18 0436  WBC 7.2 8.2 7.6  HGB 9.2* 9.2* 9.7*  HCT 27.6* 27.8* 29.8*  PLT 163 155 172   BMET Recent Labs    05/23/18 1344 05/24/18 0652  NA 138 141  K 4.6 4.4  CL 105 108  CO2 24 26  GLUCOSE 105* 89  BUN 60* 42*  CREATININE 1.27* 0.94  CALCIUM 8.9 8.6*   LFT Recent Labs    05/23/18 1344  PROT 5.6*  ALBUMIN 3.4*  AST 22  ALT 14  ALKPHOS 35*  BILITOT 0.4   Studies/Results: Dg Chest Port 1 View  Result Date: 05/23/2018 CLINICAL DATA:  Shortness of breath EXAM: PORTABLE CHEST 1 VIEW COMPARISON:  09/17/2016 FINDINGS: Chronic interstitial markings. Radiation changes in the left upper lobe. Subpleural reticulation/scarring in the lungs bilaterally, particularly in the right upper lobe. Mild left basilar opacity, likely scarring/atelectasis. No focal consolidation. No definite  pleural effusions. Mild eventration of the left hemidiaphragm. No pneumothorax. Cardiomegaly. Postsurgical changes in the left breast and left axilla. IMPRESSION: Chronic interstitial markings with postsurgical and suspected post radiation changes in the left hemithorax. No evidence of acute cardiopulmonary disease. Electronically Signed   By: Julian Hy M.D.   On: 05/23/2018 15:18   Dg C-arm 1-60 Min  Result Date: 05/24/2018 CLINICAL DATA:  Enterostomy EXAM: DG C-ARM 61-120 MIN COMPARISON:  None. FLUOROSCOPY TIME:  13 seconds, 1.36 mGy FINDINGS: Two fluoroscopic spot images from enterostomy. IMPRESSION: Intraoperative localization Electronically Signed   By: Kathreen Devoid   On: 05/24/2018 14:50       Assessment / Plan:   Assessment: 1.  Melena, acute blood loss anemia: Enteroscopy 05/24/2018 with actively bleeding AVM/dieulfoy's lesion status post endoscopic treatment, multiple small AVMs throughout the small bowel (untreated), hemoglobin stable this morning, patient has not had a bowel movement since time of procedure  Plan: 1.  Okay with patient discharged today as long as there are no further signs of acute GI bleeding. 2.  Continue to monitor hemoglobin while she is in the hospital with transfusion as needed 3.  We will arrange for patient follow-up with [place] in 2-3 weeks at that time we will get a recheck of her hemoglobin 4.  Patient will need to stay on Protonix 40 mg twice daily 5.  Advised patient to avoid NSAIDs 6.  We will sign off.  Thank you  for your kind consultation, we will sign off.    LOS: 2 days   Levin Erp  05/25/2018, 9:32 AM   Attending physician's note   I have reviewed the chart. I agree with the Advanced Practitioner's note, impression and recommendations.  S/P Enteroscopy 05/24/2018 with actively bleeding AVM/dieulfoy's lesion in mid-jejunum status post endoscopic treatment, multiple small AVMs throughout the small bowel (untreated), No further  bleeding. No immediate complications. Recommend continue to monitor hemoglobin as an outpatient. May need repeat enteroscopy if she continues to have anemia.  Carmell Austria, MD

## 2018-05-26 ENCOUNTER — Telehealth: Payer: Self-pay | Admitting: *Deleted

## 2018-05-26 NOTE — Telephone Encounter (Signed)
Transition Care Management Follow-up Telephone Call   Date discharged? 05/25/18   How have you been since you were released from the hospital? Pt states she is doing fine   Do you understand why you were in the hospital? YES   Do you understand the discharge instructions? YES   Where were you discharged to? Home   Items Reviewed:  Medications reviewed: YES, pt states no changes  Allergies reviewed: YES  Dietary changes reviewed: YES, heart healthy  Referrals reviewed: YES, will see her GI provider 8/27   Functional Questionnaire:   Activities of Daily Living (ADLs):   She states she are independent in the following: ambulation, bathing and hygiene, feeding, continence, grooming, toileting and dressing States she doesn't require assistance    Any transportation issues/concerns?: NO   Any patient concerns? NO   Confirmed importance and date/time of follow-up visits scheduled YES, appt 06/03/18  Provider Appointment booked with Dr. Sharlet Salina  Confirmed with patient if condition begins to worsen call PCP or go to the ER.  Patient was given the office number and encouraged to call back with question or concerns.  : YES

## 2018-05-27 ENCOUNTER — Telehealth: Payer: Self-pay | Admitting: Physician Assistant

## 2018-05-27 NOTE — Telephone Encounter (Signed)
The pt is aware that if she has increasing black stools or BRB per rectum to call the office back.  She will keep the f/u as scheduled unless symptoms return.

## 2018-05-27 NOTE — Telephone Encounter (Signed)
Wants to make sure Paula Ward is aware that she is having black stools. Tells me she feels bad because having black stools makes her sad.

## 2018-05-31 ENCOUNTER — Telehealth: Payer: Self-pay

## 2018-05-31 DIAGNOSIS — D62 Acute posthemorrhagic anemia: Secondary | ICD-10-CM

## 2018-05-31 NOTE — Addendum Note (Signed)
Addended by: Pricilla Holm A on: 05/31/2018 02:04 PM   Modules accepted: Orders

## 2018-05-31 NOTE — Telephone Encounter (Signed)
Copied from [place] [id number]. Topic: General - Other >> May 31, 2018  7:47 AM Keene Breath wrote: Reason for CRM: Patient called to speak with the nurse  because she wanted to have her blood checked before her appointment on Thursday, 06/03/18.  Patient stated that she was just released from hospital one week ago and did not want to wait to have her blood checked at her appointment time.  Please advise.  CB# [phone] or [phone].

## 2018-05-31 NOTE — Telephone Encounter (Signed)
Order placed

## 2018-05-31 NOTE — Telephone Encounter (Signed)
LVM for patient to call back to inform them labs have been placed

## 2018-06-01 ENCOUNTER — Other Ambulatory Visit (INDEPENDENT_AMBULATORY_CARE_PROVIDER_SITE_OTHER): Payer: Medicare Other

## 2018-06-01 DIAGNOSIS — D62 Acute posthemorrhagic anemia: Secondary | ICD-10-CM | POA: Diagnosis not present

## 2018-06-01 LAB — CBC
HCT: 30.8 % — ABNORMAL LOW (ref 36.0–46.0)
Hemoglobin: 10.3 g/dL — ABNORMAL LOW (ref 12.0–15.0)
MCHC: 33.5 g/dL (ref 30.0–36.0)
MCV: 94.7 fl (ref 78.0–100.0)
PLATELETS: 230 10*3/uL (ref 150.0–400.0)
RBC: 3.25 Mil/uL — ABNORMAL LOW (ref 3.87–5.11)
RDW: 19.2 % — AB (ref 11.5–15.5)
WBC: 4.9 10*3/uL (ref 4.0–10.5)

## 2018-06-03 ENCOUNTER — Encounter: Payer: Self-pay | Admitting: Internal Medicine

## 2018-06-03 ENCOUNTER — Ambulatory Visit: Payer: Medicare Other | Admitting: Internal Medicine

## 2018-06-03 DIAGNOSIS — K31811 Angiodysplasia of stomach and duodenum with bleeding: Secondary | ICD-10-CM | POA: Diagnosis not present

## 2018-06-03 DIAGNOSIS — I10 Essential (primary) hypertension: Secondary | ICD-10-CM

## 2018-06-03 MED ORDER — ALBUTEROL SULFATE HFA 108 (90 BASE) MCG/ACT IN AERS: 2.0000 | INHALATION_SPRAY | Freq: Four times a day (QID) | RESPIRATORY_TRACT | 3 refills | Status: DC | PRN

## 2018-06-03 NOTE — Progress Notes (Signed)
   Subjective:    Patient ID: Paula Ward, female    DOB: March 26, 1936, 82 y.o.   MRN: [id number]  HPI The patient is a 82 YO female coming in for hospital follow up (in for GI bleeding, given EGD with ablation of the angioplastic lesion in the duodenum, black tarry stools stopped, given transfusions 3 units while in the hospital). She is feeling some improvement today. Since leaving the hospital she denies blood in stool. She is eating and drinking well. Denies chest pains or SOB. She is still having decreased energy. Some extra resting. She is overall feeling improved. Denies falls since being home. Appetite is good.   PMH, [facility], social history reviewed and updated.   Review of Systems  Constitutional: Positive for activity change and fatigue. Negative for appetite change and unexpected weight change.  HENT: Negative.   Eyes: Negative.   Respiratory: Negative for cough, chest tightness and shortness of breath.   Cardiovascular: Negative for chest pain, palpitations and leg swelling.  Gastrointestinal: Negative for abdominal distention, abdominal pain, constipation, diarrhea, nausea and vomiting.  Musculoskeletal: Negative.   Skin: Negative.   Neurological: Positive for weakness. Negative for dizziness, speech difficulty, light-headedness and numbness.  Psychiatric/Behavioral: Negative.       Objective:   Physical Exam  Constitutional: She is oriented to person, place, and time. She appears well-developed and well-nourished.  Chronically ill appearing  HENT:  Head: Normocephalic and atraumatic.  Eyes: EOM are normal.  Neck: Normal range of motion.  Cardiovascular: Normal rate and regular rhythm.  Pulmonary/Chest: Effort normal and breath sounds normal. No respiratory distress. She has no wheezes. She has no rales.  Abdominal: Soft. Bowel sounds are normal. She exhibits no distension. There is no tenderness. There is no rebound.  Musculoskeletal: She exhibits no edema.    Neurological: She is alert and oriented to person, place, and time. Coordination normal.  Skin: Skin is warm and dry.   Vitals:   06/03/18 1356  BP: (!) 118/50  Pulse: 88  Temp: 98 F (36.7 C)  TempSrc: Oral  SpO2: 93%  Weight: 115 lb (52.2 kg)  Height: _0  (1.626 m)      Assessment & Plan:

## 2018-06-03 NOTE — Patient Instructions (Signed)
We will check the blood counts in about 2 weeks.

## 2018-06-03 NOTE — Assessment & Plan Note (Addendum)
Recent CBC 2 days ago 10.3 which is still improving. Will need recheck in about 2 weeks or so with her standing order. She will keep eating iron rich foods to help supplement. She will follow up with GI.

## 2018-06-03 NOTE — Assessment & Plan Note (Signed)
BP at goal on her diltiazem.

## 2018-06-06 ENCOUNTER — Emergency Department (HOSPITAL_BASED_OUTPATIENT_CLINIC_OR_DEPARTMENT_OTHER)
Admission: EM | Admit: 2018-06-06 | Discharge: 2018-06-07 | Disposition: A | Discharge status: Home / Self Care | Payer: Medicare Other | Attending: Emergency Medicine | Admitting: Emergency Medicine

## 2018-06-06 ENCOUNTER — Emergency Department (HOSPITAL_BASED_OUTPATIENT_CLINIC_OR_DEPARTMENT_OTHER): Payer: Medicare Other

## 2018-06-06 ENCOUNTER — Encounter (HOSPITAL_BASED_OUTPATIENT_CLINIC_OR_DEPARTMENT_OTHER): Payer: Self-pay | Admitting: Adult Health

## 2018-06-06 ENCOUNTER — Other Ambulatory Visit: Payer: Self-pay

## 2018-06-06 DIAGNOSIS — Y929 Unspecified place or not applicable: Secondary | ICD-10-CM | POA: Insufficient documentation

## 2018-06-06 DIAGNOSIS — S0990XA Unspecified injury of head, initial encounter: Secondary | ICD-10-CM

## 2018-06-06 DIAGNOSIS — Y999 Unspecified external cause status: Secondary | ICD-10-CM | POA: Diagnosis not present

## 2018-06-06 DIAGNOSIS — I129 Hypertensive chronic kidney disease with stage 1 through stage 4 chronic kidney disease, or unspecified chronic kidney disease: Secondary | ICD-10-CM | POA: Diagnosis not present

## 2018-06-06 DIAGNOSIS — S0001XA Abrasion of scalp, initial encounter: Secondary | ICD-10-CM | POA: Diagnosis not present

## 2018-06-06 DIAGNOSIS — Z23 Encounter for immunization: Secondary | ICD-10-CM | POA: Insufficient documentation

## 2018-06-06 DIAGNOSIS — N183 Chronic kidney disease, stage 3 (moderate): Secondary | ICD-10-CM | POA: Diagnosis not present

## 2018-06-06 DIAGNOSIS — Y939 Activity, unspecified: Secondary | ICD-10-CM | POA: Diagnosis not present

## 2018-06-06 DIAGNOSIS — W228XXA Striking against or struck by other objects, initial encounter: Secondary | ICD-10-CM | POA: Diagnosis not present

## 2018-06-06 DIAGNOSIS — C50912 Malignant neoplasm of unspecified site of left female breast: Secondary | ICD-10-CM | POA: Insufficient documentation

## 2018-06-06 DIAGNOSIS — Z79899 Other long term (current) drug therapy: Secondary | ICD-10-CM | POA: Insufficient documentation

## 2018-06-06 MED ORDER — TETANUS-DIPHTH-ACELL PERTUSSIS 5-2.5-18.5 LF-MCG/0.5 IM SUSP
0.5000 mL | Freq: Once | INTRAMUSCULAR | Status: AC
  Administered 2018-06-07: 0.5 mL via INTRAMUSCULAR
  Filled 2018-06-06: qty 0.5

## 2018-06-06 NOTE — ED Triage Notes (Signed)
Presents with laceration to occiput of head after stomping on a bug and then she sat down in a chair hard and her head went backwards and hit the corner of a table. SHe did not lose consciousness. SHe is alert and oriented. Hematoma and 1 inch lac to back of head. PERRL.

## 2018-06-07 NOTE — ED Provider Notes (Signed)
[facility] EMERGENCY DEPARTMENT Provider Note   CSN: [id number] Arrival date & time: 06/06/18  2212     History   Chief Complaint Chief Complaint  Patient presents with  . Head Injury    HPI KAYANA THOEN is a 82 y.o. female.  82 year old female with extensive past medical history below who presents with head injury.  Just prior to arrival, she sat down hard in a chair and her head went back and struck her posterior scalp on a corner.  She had significant bleeding that has since stopped.  She denies any loss of consciousness.  No vomiting, confusion, or other complaints.  No anticoagulant use.  The history is provided by the patient.  Head Injury      Past Medical History:  Diagnosis Date  . Anemia   . Angiodysplasia of stomach and duodenum   . Arthus phenomenon   . AVM (arteriovenous malformation)   . Blood transfusion without reported diagnosis    last 4 units 12-22-15, Iron infusion x2 last -01-07-16,01-14-16.  . Breast cancer ([place]) 1989   Left  . Candida esophagitis ([place])   . Cataract   . Chronic kidney disease    Chronic mild renal insuffiency  . CREST syndrome ([place])   . GERD (gastroesophageal reflux disease)    w/ HH  . Interstitial lung disease ([place])   . Nodule of kidney   . PONV (postoperative nausea and vomiting)   . Pulmonary embolus ([place]) 2003  . Pulmonary hypertension ([place])    followed by Dr Gaynell Face at [facility], now Dr. Maryjean Ka visit end 2'17.  . Rectal incontinence   . Renal cell carcinoma ([place])   . Scleroderma ([place])   . Tubular adenoma of colon   . Uterine polyp     Patient Active Problem List   Diagnosis Date Noted  . GI bleed 05/23/2018  . Rash and nonspecific skin eruption 03/22/2018  . Dry mouth 02/15/2018  . Allergic rhinitis 11/05/2017  . Cerebellar 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 ([place]) 10/26/2017  . Routine general medical examination at a health care facility 01/30/2017  . Pericardial effusion 01/21/2016  .  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HCC)   . Osteoarthritis 02/25/2015  . Mass of left side of neck   . CKD (chronic kidney disease) stage 3, GFR 30-59 ml/min (HCC) 09/01/2012  . RECTAL INCONTINENCE 03/27/2009  . Angiodysplasia of stomach and duodenum 08/22/2008  . ADENOCARCINOMA, BREAST, LEFT 07/26/2007  . Anemia, iron deficiency 07/26/2007  . Essential hypertension 07/26/2007  . Pulmonary hypertension ([place]) 07/26/2007  . GASTROESOPHAGEAL REFLUX DISEASE 07/26/2007  . SCLERODERMA 07/26/2007  . ILD (interstitial lung disease) ([place]) 07/26/2007  . PULMONARY EMBOLISM, HX OF 07/26/2007    Past Surgical History:  Procedure Laterality Date  . APPENDECTOMY  1962  . BREAST LUMPECTOMY  1989   left  . CATARACT EXTRACTION, BILATERAL Bilateral 12/2013  . ENTEROSCOPY N/A 01/18/2016   Procedure: ENTEROSCOPY;  Surgeon: Mauri Pole, MD;  [facility]: [facility];  Service: Endoscopy;  Laterality: N/A;  . ENTEROSCOPY N/A 05/24/2018   Procedure: ENTEROSCOPY;  Surgeon: Jackquline Denmark, MD;  [facility];  Service: Endoscopy;  Laterality: N/A;  . ESOPHAGOGASTRODUODENOSCOPY (EGD) WITH PROPOFOL N/A 12/21/2015   Procedure: ESOPHAGOGASTRODUODENOSCOPY (EGD) WITH PROPOFOL;  Surgeon: Irene Shipper, MD;  [facility];  Service: Endoscopy;  Laterality: N/A;  . HOT HEMOSTASIS N/A 05/24/2018   Procedure: HOT HEMOSTASIS (ARGON PLASMA COAGULATION/BICAP);  Surgeon: Jackquline Denmark, MD;  Location[facility];  Service: Endoscopy;  Laterality: N/A;  . IR GENERIC HISTORICAL  06/05/2016   IR RADIOLOGIST EVAL & MGMT 06/05/2016 Aletta Edouard, MD [facility] INTERV RAD  . IVC Filter    . KIDNEY SURGERY     left -"laser surgery by Dr. Kathlene Cote- 5 yrs ago" no removal  . SCHLEROTHERAPY  05/24/2018   Procedure: Woodward Ku;  Surgeon: Jackquline Denmark, MD;  [facility];  Service: Endoscopy;;  . TONSILLECTOMY    . TUBAL LIGATION       OB History   None      Home Medications    Prior to Admission  medications   Medication Sig Start Date End Date Taking? Authorizing Provider  acetaminophen (TYLENOL) 325 MG tablet Take 650 mg by mouth every 6 (six) hours as needed for mild pain.     [provider]  albuterol (PROVENTIL HFA;VENTOLIN HFA) 108 (90 Base) MCG/ACT inhaler Inhale 2 puffs into the lungs every 6 (six) hours as needed for wheezing or shortness of breath. 06/03/18   Hoyt Koch, MD  ALPRAZolam Duanne Moron) 0.25 MG tablet Take 0.5 tablets (0.125 mg total) by mouth at bedtime as needed. 12/03/16   Hoyt Koch, MD  Cholecalciferol (VITAMIN D-1000 MAX ST) 1000 units tablet Take 3,000 Units by mouth daily.    [provider]  diltiazem (CARDIZEM CD) 120 MG 24 hr capsule Take 120 mg by mouth daily.      [provider]  fluticasone Asencion Islam) 50 MCG/ACT nasal spray instill 1 spray into each nostril twice a day if needed 11/05/17   Hoyt Koch, MD  meclizine (ANTIVERT) 12.5 MG tablet Take 1-2 tablets (12.5-25 mg total) by mouth 3 (three) times daily as needed for dizziness. Patient not taking: Reported on 05/23/2018 10/26/17   Binnie Rail, MD  montelukast (SINGULAIR) 10 MG tablet Take 1 tablet (10 mg total) by mouth at bedtime. 02/15/18   Hoyt Koch, MD  Mouthwashes (DRY MOUTH MOUTHWASH) LIQD Use as directed 5 mLs in the mouth or throat as needed (dry mouth). 02/15/18   Hoyt Koch, MD  Multiple Vitamin (MULTIVITAMIN WITH MINERALS) TABS tablet Take 1 tablet by mouth daily. NO IRON    [provider]  mupirocin cream (BACTROBAN) 2 % Apply 1 application topically 2 (two) times daily. Patient not taking: Reported on 05/23/2018 03/22/18   Binnie Rail, MD  omeprazole (PRILOSEC) 40 MG capsule Take 1 capsule (40 mg total) by mouth 2 (two) times daily. 05/25/18   Shelly Coss, MD  OPSUMIT 10 MG TABS Take 1 tablet by mouth daily.  08/16/15   [provider]  pilocarpine (SALAGEN) 5 MG tablet Take 1 tablet (5 mg total) by  mouth 3 (three) times daily as needed. Patient not taking: Reported on 05/23/2018 02/15/18   Hoyt Koch, MD    Family History Family History  Problem Relation Age of Onset  . Bladder Cancer Father   . Diabetes Father   . Prostate cancer Father   . Alzheimer's disease Mother   . Diabetes Sister   . Lung cancer Sister         smoker  . Esophageal cancer Paternal Uncle   . Colon cancer Neg Hx     Social History Social History   Tobacco Use  . Smoking status: Never Smoker  . Smokeless tobacco: Never Used  Substance Use Topics  . Alcohol use: No  . Drug use: No     Allergies   Codeine; Other; Erythromycin; and Lisinopril  Review of Systems Review of Systems All other systems reviewed and are negative except that which was mentioned in HPI   Physical Exam Updated Vital Signs BP (!) 113/102 (BP Location: Left Arm)   Pulse 82   Temp 98 F (36.7 C) (Oral)   Resp 18   SpO2 96%   Physical Exam  Constitutional: She is oriented to person, place, and time. She appears well-developed and well-nourished. No distress.  HENT:  Head: Normocephalic.  Moist mucous membranes 1cm abrasion occipital scalp with no active bleeding and no gaping, underlying hematoma  Eyes: Pupils are equal, round, and reactive to light. Conjunctivae are normal.  Neck: Neck supple.  Cardiovascular: Normal rate, regular rhythm and normal heart sounds.  Pulmonary/Chest: Effort normal and breath sounds normal.  Abdominal: Soft. Bowel sounds are normal. She exhibits no distension. There is no tenderness.  Musculoskeletal: She exhibits no edema.  Neurological: She is alert and oriented to person, place, and time.  Fluent speech  Skin: Skin is warm and dry.  Psychiatric: She has a normal mood and affect. Judgment normal.  Nursing note and vitals reviewed.    ED Treatments / Results  Labs (all labs ordered are listed, but only abnormal results are displayed) Labs Reviewed - No data to  display  EKG None  Radiology Ct Head Wo Contrast  Result Date: 06/06/2018 CLINICAL DATA:  Head trauma EXAM: CT HEAD WITHOUT CONTRAST TECHNIQUE: Contiguous axial images were obtained from the base of the skull through the vertex without intravenous contrast. COMPARISON:  MRI 10/29/2017 FINDINGS: Brain: No acute territorial infarction, hemorrhage or intracranial mass. Mild small vessel ischemic changes of the white matter. Mild atrophy. Stable ventricle size. Vascular: No hyperdense vessels. Scattered carotid vascular calcification Skull: Normal. Negative for fracture or focal lesion. Sinuses/Orbits: No acute finding. Other: None IMPRESSION: 1. No CT evidence for acute intracranial abnormality. 2. Atrophy and mild small vessel ischemic changes of the white matter Electronically Signed   By: Donavan Foil M.D.   On: 06/06/2018 23:45    Procedures Procedures (including critical care time)  Medications Ordered in ED Medications  Tdap (BOOSTRIX) injection 0.5 mL (0.5 mLs Intramuscular Given 06/07/18 0017)     Initial Impression / Assessment and Plan / ED Course  I have reviewed the triage vital signs and the nursing notes.  Pertinent imaging results that were available during my care of the patient were reviewed by me and considered in my medical decision making (see chart for details).     Well-appearing and comfortable on exam.  She did not have fall or significant mechanism but does have a sizable hematoma on posterior scalp, therefore obtain CT head to rule out intracranial injury.  CT is reassuring.  Updated tetanus.  Wound is actually superficial and does not require any staples.  Discussed supportive measures.  Reviewed return precautions including any new neurologic symptoms.  Patient and husband voiced understanding. Final Clinical Impressions(s) / ED Diagnoses   Final diagnoses:  Injury of head, initial encounter  Abrasion of scalp, initial encounter    ED Discharge Orders     None       Shann Merrick, Wenda Overland, MD 06/07/18 [phone]

## 2018-06-07 NOTE — ED Notes (Signed)
ED Provider at bedside.

## 2018-06-15 ENCOUNTER — Other Ambulatory Visit (INDEPENDENT_AMBULATORY_CARE_PROVIDER_SITE_OTHER): Payer: Medicare Other

## 2018-06-15 ENCOUNTER — Ambulatory Visit: Payer: Medicare Other | Admitting: Physician Assistant

## 2018-06-15 ENCOUNTER — Encounter: Payer: Self-pay | Admitting: Physician Assistant

## 2018-06-15 VITALS — BP 130/60 | HR 74 | Ht 64.5 in | Wt 114.6 lb

## 2018-06-15 DIAGNOSIS — I272 Pulmonary hypertension, unspecified: Secondary | ICD-10-CM | POA: Diagnosis not present

## 2018-06-15 DIAGNOSIS — D500 Iron deficiency anemia secondary to blood loss (chronic): Secondary | ICD-10-CM | POA: Diagnosis not present

## 2018-06-15 DIAGNOSIS — K5520 Angiodysplasia of colon without hemorrhage: Secondary | ICD-10-CM

## 2018-06-15 LAB — IBC PANEL
Iron: 50 ug/dL (ref 42–145)
SATURATION RATIOS: 20.4 % (ref 20.0–50.0)
TRANSFERRIN: 175 mg/dL — AB (ref 212.0–360.0)

## 2018-06-15 LAB — COMPREHENSIVE METABOLIC PANEL
ALBUMIN: 4.2 g/dL (ref 3.5–5.2)
ALK PHOS: 62 U/L (ref 39–117)
ALT: 11 U/L (ref 0–35)
AST: 22 U/L (ref 0–37)
BUN: 21 mg/dL (ref 6–23)
CO2: 29 mEq/L (ref 19–32)
CREATININE: 1.17 mg/dL (ref 0.40–1.20)
Calcium: 9.7 mg/dL (ref 8.4–10.5)
Chloride: 99 mEq/L (ref 96–112)
GFR: 47.33 mL/min — ABNORMAL LOW (ref >60.00)
Glucose, Bld: 97 mg/dL (ref 70–99)
Potassium: 4.6 mEq/L (ref 3.5–5.1)
SODIUM: 134 meq/L — AB (ref 135–145)
TOTAL PROTEIN: 6.8 g/dL (ref 6.0–8.3)
Total Bilirubin: 0.5 mg/dL (ref 0.2–1.2)

## 2018-06-15 LAB — FERRITIN: FERRITIN: 460.5 ng/mL — AB (ref 10.0–291.0)

## 2018-06-15 LAB — CBC
HCT: 30.7 % — ABNORMAL LOW (ref 36.0–46.0)
Hemoglobin: 10.3 g/dL — ABNORMAL LOW (ref 12.0–15.0)
MCHC: 33.5 g/dL (ref 30.0–36.0)
MCV: 95.1 fl (ref 78.0–100.0)
Platelets: 221 10*3/uL (ref 150.0–400.0)
RBC: 3.23 Mil/uL — ABNORMAL LOW (ref 3.87–5.11)
RDW: 18.9 % — ABNORMAL HIGH (ref 11.5–15.5)
WBC: 4.9 10*3/uL (ref 4.0–10.5)

## 2018-06-15 NOTE — Progress Notes (Signed)
Chief Complaint: Follow-up hospital visit for GI bleed  HPI:    Paula Ward is a 82 year old female, who follows with Dr. Silverio Decamp, with a past medical history as listed below including AVMs and previous need for blood transfusion with acute blood loss anemia, who presents to clinic today for follow-up after hospitalization for GI bleed.    05/24/2018 patient was consulted by our service for melena and acute blood loss anemia with a history of AVMs.  At that time described passing very small what look like black stools and then becoming lightheaded.  Hemoglobin at time of admission was 5.1 she was transfused 3 units during her hospital stay.  At that time she is scheduled for an enteroscopy with Dr. Lyndel Safe.  Enteroscopy on 05/24/2018 revealed a single bleeding angiodysplastic lesion/Dula Foy lesion in the duodenum which was injected and also followed by endoscopic clipping as well as a few nonbleeding angiodysplastic lesions in the duodenum and a small hiatal hernia.  She was continued on Protonix 40 mg twice daily.    06/01/2018 follow-up CBC showed hemoglobin improving at 10.3 (9.7 on 05/25/2018).    Today, explains that she is doing fairly well recently.  Tells me she has seen no further black stools and again is having her hemoglobin checked monthly by Dr. Sharlet Salina.  Her most recent draw was back in her normal range.  Patient does tell me that she is noticing some shallow breathing when she sits down noting that she cannot seem to take a full breath.  Denies any dyspnea on exertion.    Denies fever, chills, weight loss, anorexia, nausea, vomiting, heartburn, reflux or abdominal pain.  Past Medical History:  Diagnosis Date  . Anemia   . Angiodysplasia of stomach and duodenum   . Arthus phenomenon   . AVM (arteriovenous malformation)   . Blood transfusion without reported diagnosis    last 4 units 12-22-15, Iron infusion x2 last -01-07-16,01-14-16.  . Breast cancer ([place]) 1989   Left  . Candida  esophagitis ([place])   . Cataract   . Chronic kidney disease    Chronic mild renal insuffiency  . CREST syndrome ([place])   . GERD (gastroesophageal reflux disease)    w/ HH  . Interstitial lung disease ([place])   . Nodule of kidney   . PONV (postoperative nausea and vomiting)   . Pulmonary embolus ([place]) 2003  . Pulmonary hypertension ([place])    followed by Dr Gaynell Face at [facility], now Dr. Maryjean Ka visit end 2'17.  . Rectal incontinence   . Renal cell carcinoma ([place])   . Scleroderma ([place])   . Tubular adenoma of colon   . Uterine polyp     Past Surgical History:  Procedure Laterality Date  . APPENDECTOMY  1962  . BREAST LUMPECTOMY  1989   left  . CATARACT EXTRACTION, BILATERAL Bilateral 12/2013  . ENTEROSCOPY N/A 01/18/2016   Procedure: ENTEROSCOPY;  Surgeon: Mauri Pole, MD;  [facility];  Service: Endoscopy;  Laterality: N/A;  . ENTEROSCOPY N/A 05/24/2018   Procedure: ENTEROSCOPY;  Surgeon: Jackquline Denmark, MD;  [facility];  Service: Endoscopy;  Laterality: N/A;  . ESOPHAGOGASTRODUODENOSCOPY (EGD) WITH PROPOFOL N/A 12/21/2015   Procedure: ESOPHAGOGASTRODUODENOSCOPY (EGD) WITH PROPOFOL;  Surgeon: Irene Shipper, MD;  [facility];  Service: Endoscopy;  Laterality: N/A;  . HOT HEMOSTASIS N/A 05/24/2018   Procedure: HOT HEMOSTASIS (ARGON PLASMA COAGULATION/BICAP);  Surgeon: Jackquline Denmark, MD;  [facility];  Service: Endoscopy;  Laterality: N/A;  .  IR GENERIC HISTORICAL  06/05/2016   IR RADIOLOGIST EVAL & MGMT 06/05/2016 Aletta Edouard, MD [facility] INTERV RAD  . IVC Filter    . KIDNEY SURGERY     left -"laser surgery by Dr. Kathlene Cote- 5 yrs ago" no removal  . SCHLEROTHERAPY  05/24/2018   Procedure: Woodward Ku;  Surgeon: Jackquline Denmark, MD;  [facility];  Service: Endoscopy;;  . TONSILLECTOMY    . TUBAL LIGATION      Current Outpatient Medications  Medication Sig Dispense Refill  . acetaminophen (TYLENOL) 325 MG tablet Take 650 mg by  mouth every 6 (six) hours as needed for mild pain.     Marland Kitchen albuterol (PROVENTIL HFA;VENTOLIN HFA) 108 (90 Base) MCG/ACT inhaler Inhale 2 puffs into the lungs every 6 (six) hours as needed for wheezing or shortness of breath. 1 Inhaler 3  . ALPRAZolam (XANAX) 0.25 MG tablet Take 0.5 tablets (0.125 mg total) by mouth at bedtime as needed. 30 tablet 3  . Cholecalciferol (VITAMIN D-1000 MAX ST) 1000 units tablet Take 3,000 Units by mouth daily.    Marland Kitchen diltiazem (CARDIZEM CD) 120 MG 24 hr capsule Take 120 mg by mouth daily.      . fluticasone (FLONASE) 50 MCG/ACT nasal spray instill 1 spray into each nostril twice a day if needed 48 g 3  . meclizine (ANTIVERT) 12.5 MG tablet Take 1-2 tablets (12.5-25 mg total) by mouth 3 (three) times daily as needed for dizziness. (Patient not taking: Reported on 05/23/2018) 30 tablet 1  . montelukast (SINGULAIR) 10 MG tablet Take 1 tablet (10 mg total) by mouth at bedtime. 30 tablet 3  . Mouthwashes (DRY MOUTH MOUTHWASH) LIQD Use as directed 5 mLs in the mouth or throat as needed (dry mouth). 473 mL 11  . Multiple Vitamin (MULTIVITAMIN WITH MINERALS) TABS tablet Take 1 tablet by mouth daily. NO IRON    . mupirocin cream (BACTROBAN) 2 % Apply 1 application topically 2 (two) times daily. (Patient not taking: Reported on 05/23/2018) 30 g 0  . omeprazole (PRILOSEC) 40 MG capsule Take 1 capsule (40 mg total) by mouth 2 (two) times daily. 60 capsule 0  . OPSUMIT 10 MG TABS Take 1 tablet by mouth daily.     . pilocarpine (SALAGEN) 5 MG tablet Take 1 tablet (5 mg total) by mouth 3 (three) times daily as needed. (Patient not taking: Reported on 05/23/2018) 90 tablet 1   No current facility-administered medications for this visit.     Allergies as of 06/15/2018 - Review Complete 06/06/2018  Allergen Reaction Noted  . Codeine Nausea Only 08/03/2008  . Other Nausea And Vomiting 04/05/2012  . Erythromycin Nausea And Vomiting 10/17/2014  . Lisinopril  12/14/2014    Family History    Problem Relation Age of Onset  . Bladder Cancer Father   . Diabetes Father   . Prostate cancer Father   . Alzheimer's disease Mother   . Diabetes Sister   . Lung cancer Sister         smoker  . Esophageal cancer Paternal Uncle   . Colon cancer Neg Hx     Social History   Socioeconomic History  . Marital status: Married    Spouse name: Not on file  . Number of children: 2  . Years of education: Not on file  . Highest education level: Not on file  Occupational History  . Occupation: Retired  Scientific laboratory technician  . Financial resource strain: Not hard at all  . Food insecurity:  Worry: Never true    Inability: Never true  . Transportation needs:    Medical: No    Non-medical: No  Tobacco Use  . Smoking status: Never Smoker  . Smokeless tobacco: Never Used  Substance and Sexual Activity  . Alcohol use: No  . Drug use: No  . Sexual activity: Never  Lifestyle  . Physical activity:    Days per week: 2 days    Minutes per session: 40 min  . Stress: Only a little  Relationships  . Social connections:    Talks on phone: More than three times a week    Gets together: More than three times a week    Attends religious service: More than 4 times per year    Active member of club or organization: Yes    Attends meetings of clubs or organizations: More than 4 times per year    Relationship status: Married  . Intimate partner violence:    Fear of current or ex partner: No    Emotionally abused: No    Physically abused: No    Forced sexual activity: No  Other Topics Concern  . Not on file  Social History Narrative   Married '61   1 son- '65, 1 daughter '63; 6 children (2 adopted)   SO- SOB   Retirement- doing well   Marriage in good health   Patient has never smoked   Alcohol use- no   Pt gets regular exercise          Review of Systems:    Constitutional: No weight loss, fever or chills Cardiovascular: No chest pain Respiratory: No SOB Gastrointestinal: See HPI  and otherwise negative   Physical Exam:  Vital signs: BP 130/60   Pulse 74   Ht 5' 4.5" (1.638 m)   Wt 114 lb 9.6 oz (52 kg)   BMI 19.37 kg/m   Constitutional:   Pleasant Elderly Caucasian female appears to be in NAD, Well developed, Well nourished, alert and cooperative Respiratory: Respirations even and unlabored. Lungs clear to auscultation bilaterally.   No wheezes, crackles, or rhonchi.  Cardiovascular: Normal S1, S2. No MRG. Regular rate and rhythm. No peripheral edema, cyanosis or pallor.  Gastrointestinal:  Soft, nondistended, nontender. No rebound or guarding. Normal bowel sounds. No appreciable masses or hepatomegaly. Psychiatric: Demonstrates good judgement and reason without abnormal affect or behaviors.  MOST RECENT LABS AND IMAGING: CBC    Component Value Date/Time   WBC 4.9 06/01/2018 1152   RBC 3.25 (L) 06/01/2018 1152   HGB 10.3 (L) 06/01/2018 1152   HCT 30.8 (L) 06/01/2018 1152   PLT 230.0 06/01/2018 1152   MCV 94.7 06/01/2018 1152   MCH 31.1 05/25/2018 0436   MCHC 33.5 06/01/2018 1152   RDW 19.2 (H) 06/01/2018 1152   LYMPHSABS 1.1 05/25/2018 0436   MONOABS 0.6 05/25/2018 0436   EOSABS 0.2 05/25/2018 0436   BASOSABS 0.0 05/25/2018 0436    CMP     Component Value Date/Time   NA 141 05/24/2018 0652   K 4.4 05/24/2018 0652   CL 108 05/24/2018 0652   CO2 26 05/24/2018 0652   GLUCOSE 89 05/24/2018 0652   GLUCOSE 101 (H) 10/02/2006 0845   BUN 42 (H) 05/24/2018 0652   CREATININE 0.94 05/24/2018 0652   CALCIUM 8.6 (L) 05/24/2018 0652   PROT 5.6 (L) 05/23/2018 1344   ALBUMIN 3.4 (L) 05/23/2018 1344   AST 22 05/23/2018 1344   ALT 14 05/23/2018 1344   ALKPHOS 35 (  L) 05/23/2018 1344   BILITOT 0.4 05/23/2018 1344   GFRNONAA 56 (L) 05/24/2018 0652   GFRAA >60 05/24/2018 1975    Assessment: 1.  History of AVMs: Ablated and clipped during time of recent hospitalization, no melena since 2.  GI bleed: With above 3.  Acute blood loss anemia: Chronic for the  patient due to AVMs  Plan: 1.  We will recheck iron studies today to ensure the patient would not benefit from iron infusion after recent GI blood loss. 2.  Would recommend patient continue to follow with her PCP in regards to monthly checks of her hemoglobin. 3.  Would recommend patient contact PCP in regards to shallow breathing.  This is likely not related to her anemia at this point as her hemoglobin is in normal range and could be related to something else.  She would likely benefit from a chest x-ray. 4.  Patient to follow in clinic with Dr. Silverio Decamp or myself as needed in the future.  Ellouise Newer, PA-C [facility] 06/15/2018, 3:31 PM  Cc: Hoyt Koch, *

## 2018-06-15 NOTE — Patient Instructions (Signed)
If you are age 80 or older, your body mass index should be between 23-30. Your Body mass index is 19.37 kg/m. If this is out of the aforementioned range listed, please consider follow up with your Primary Care Provider.  If you are age 21 or younger, your body mass index should be between 19-25. Your Body mass index is 19.37 kg/m. If this is out of the aformentioned range listed, please consider follow up with your Primary Care Provider.   Your provider has requested that you go to the basement level for lab work before leaving today. Press "B" on the elevator. The lab is located at the first door on the left as you exit the elevator. IBC Panel Ferritin  Follow up as needed.  Thank you for choosing me and [facility].   Ellouise Newer, PA-C

## 2018-06-24 ENCOUNTER — Telehealth: Payer: Self-pay | Admitting: Internal Medicine

## 2018-06-24 ENCOUNTER — Encounter: Payer: Self-pay | Admitting: Internal Medicine

## 2018-06-24 ENCOUNTER — Other Ambulatory Visit (INDEPENDENT_AMBULATORY_CARE_PROVIDER_SITE_OTHER): Payer: Medicare Other

## 2018-06-24 ENCOUNTER — Ambulatory Visit: Payer: Medicare Other | Admitting: Internal Medicine

## 2018-06-24 ENCOUNTER — Ambulatory Visit (INDEPENDENT_AMBULATORY_CARE_PROVIDER_SITE_OTHER)
Admission: RE | Admit: 2018-06-24 | Discharge: 2018-06-24 | Disposition: A | Discharge status: Home / Self Care | Payer: Medicare Other | Source: Ambulatory Visit | Attending: Internal Medicine | Admitting: Internal Medicine

## 2018-06-24 VITALS — BP 114/60 | HR 78 | Temp 97.9°F | Ht 64.5 in | Wt 116.0 lb

## 2018-06-24 DIAGNOSIS — R0602 Shortness of breath: Secondary | ICD-10-CM | POA: Diagnosis not present

## 2018-06-24 DIAGNOSIS — I272 Pulmonary hypertension, unspecified: Secondary | ICD-10-CM

## 2018-06-24 DIAGNOSIS — R0601 Orthopnea: Secondary | ICD-10-CM | POA: Insufficient documentation

## 2018-06-24 LAB — T4, FREE: Free T4: 0.8 ng/dL (ref 0.60–1.60)

## 2018-06-24 LAB — COMPREHENSIVE METABOLIC PANEL
ALT: 11 U/L (ref 0–35)
AST: 20 U/L (ref 0–37)
Albumin: 4.1 g/dL (ref 3.5–5.2)
Alkaline Phosphatase: 63 U/L (ref 39–117)
BUN: 21 mg/dL (ref 6–23)
CO2: 29 meq/L (ref 19–32)
Calcium: 9.2 mg/dL (ref 8.4–10.5)
Chloride: 97 mEq/L (ref 96–112)
Creatinine, Ser: 1.11 mg/dL (ref 0.40–1.20)
GFR: 50.29 mL/min — AB (ref >60.00)
GLUCOSE: 80 mg/dL (ref 70–99)
POTASSIUM: 4.2 meq/L (ref 3.5–5.1)
Sodium: 133 mEq/L — ABNORMAL LOW (ref 135–145)
Total Bilirubin: 0.5 mg/dL (ref 0.2–1.2)
Total Protein: 6.5 g/dL (ref 6.0–8.3)

## 2018-06-24 LAB — LIPID PANEL
CHOL/HDL RATIO: 3
Cholesterol: 158 mg/dL (ref 0–200)
HDL: 57.5 mg/dL (ref >39.00)
LDL Cholesterol: 78 mg/dL (ref 0–99)
NONHDL: 100.38
Triglycerides: 112 mg/dL (ref 0.0–149.0)
VLDL: 22.4 mg/dL (ref 0.0–40.0)

## 2018-06-24 LAB — CBC
HEMATOCRIT: 31.2 % — AB (ref 36.0–46.0)
Hemoglobin: 10.4 g/dL — ABNORMAL LOW (ref 12.0–15.0)
MCHC: 33.4 g/dL (ref 30.0–36.0)
MCV: 95.2 fl (ref 78.0–100.0)
PLATELETS: 196 10*3/uL (ref 150.0–400.0)
RBC: 3.27 Mil/uL — AB (ref 3.87–5.11)
RDW: 18.7 % — ABNORMAL HIGH (ref 11.5–15.5)
WBC: 5.4 10*3/uL (ref 4.0–10.5)

## 2018-06-24 LAB — VITAMIN D 25 HYDROXY (VIT D DEFICIENCY, FRACTURES): VITD: 110.9 ng/mL (ref 30.00–100.00)

## 2018-06-24 LAB — VITAMIN B12: Vitamin B-12: 451 pg/mL (ref 211–911)

## 2018-06-24 LAB — TSH: TSH: 5.97 u[IU]/mL — ABNORMAL HIGH (ref 0.35–4.50)

## 2018-06-24 MED ORDER — PREDNISONE 20 MG PO TABS: 40.0000 mg | ORAL_TABLET | Freq: Every day | ORAL | 0 refills | Status: DC

## 2018-06-24 NOTE — Telephone Encounter (Signed)
noted 

## 2018-06-24 NOTE — Patient Instructions (Signed)
We will check the blood work and the x-ray today and call you back about the results.   We have sent in prednisone in case you need to take it. If so, take 2 pills daily for 5 days.

## 2018-06-24 NOTE — Telephone Encounter (Signed)
CRITICAL VALUE STICKER  CRITICAL VALUE:Vit D 110  RECEIVER (on-site recipient of call):Julie  DATE & TIME NOTIFIED: 9/5 11:48  MESSENGER (representative from lab):Lavella Lemons  MD NOTIFIED: Sharlet Salina  TIME OF NOTIFICATION:11:50  RESPONSE: Noted

## 2018-06-24 NOTE — Assessment & Plan Note (Signed)
Checking B12, thyroid, vitamin D today. Also checking CXR today. Rx for prednisone 40 mg daily for 5 days. She does have ILD which could be worsening.

## 2018-06-24 NOTE — Progress Notes (Signed)
   Subjective:    Patient ID: Paula Ward, female    DOB: [birth year], 82 y.o.   MRN: [id number]  HPI The patient is a 82 YO female coming in for concerns about SOB. Going on since GI hemorrhage and transfusion. She denies cough worsening. Has chronic nasal drainage which is unchanged. Denies significant swelling or weight change. Denies abdominal pain or symptoms. No recurrent dark stools. Denies fevers or chills. Denies chest pains or pressure. Denies palpitations.   Review of Systems  Constitutional: Negative.   HENT: Positive for postnasal drip.   Eyes: Negative.   Respiratory: Positive for shortness of breath. Negative for cough and chest tightness.   Cardiovascular: Negative for chest pain, palpitations and leg swelling.  Gastrointestinal: Negative for abdominal distention, abdominal pain, constipation, diarrhea, nausea and vomiting.  Musculoskeletal: Negative.   Skin: Negative.   Neurological: Negative.   Psychiatric/Behavioral: Negative.       Objective:   Physical Exam  Constitutional: She is oriented to person, place, and time. She appears well-developed and well-nourished.  HENT:  Head: Normocephalic and atraumatic.  Eyes: EOM are normal.  Neck: Normal range of motion.  Cardiovascular: Normal rate and regular rhythm.  Murmur heard. stable  Pulmonary/Chest: Effort normal. No respiratory distress. She has no wheezes. She has no rales.  Breath sounds stable  Abdominal: Soft. Bowel sounds are normal. She exhibits no distension. There is no tenderness. There is no rebound.  Musculoskeletal: She exhibits no edema.  Neurological: She is alert and oriented to person, place, and time. Coordination normal.  Skin: Skin is warm and dry.  Psychiatric: She has a normal mood and affect.   Vitals:   06/24/18 0938  BP: 114/60  Pulse: 78  Temp: 97.9 F (36.6 C)  TempSrc: Oral  SpO2: 94%  Weight: 116 lb (52.6 kg)  Height: 5' 4.5" (1.638 m)      Assessment & Plan:

## 2018-06-25 ENCOUNTER — Telehealth: Payer: Self-pay | Admitting: Internal Medicine

## 2018-06-25 NOTE — Telephone Encounter (Signed)
Patient notified of results

## 2018-06-25 NOTE — Telephone Encounter (Signed)
Pt called in returning call for results  Copied from [place] [id number]. Topic: Quick Communication - Other Results >> Jun 25, 2018 10:00 AM Blondell Reveal, CMA wrote: Called patient to inform them of chest x-ray results. When patient returns call, triage nurse may disclose results.

## 2018-06-28 ENCOUNTER — Ambulatory Visit: Payer: Medicare Other | Admitting: Internal Medicine

## 2018-06-28 NOTE — Progress Notes (Signed)
Reviewed and agree with documentation and assessment and plan. Damaris Hippo , MD

## 2018-07-10 ENCOUNTER — Encounter: Payer: Self-pay | Admitting: Family Medicine

## 2018-07-10 ENCOUNTER — Ambulatory Visit: Payer: Medicare Other | Admitting: Family Medicine

## 2018-07-10 VITALS — BP 122/50 | HR 95 | Temp 98.4°F | Resp 14 | Wt 115.0 lb

## 2018-07-10 DIAGNOSIS — J0100 Acute maxillary sinusitis, unspecified: Secondary | ICD-10-CM

## 2018-07-10 MED ORDER — DOXYCYCLINE HYCLATE 100 MG PO TABS: 100.0000 mg | ORAL_TABLET | Freq: Two times a day (BID) | ORAL | 0 refills | Status: DC

## 2018-07-10 NOTE — Progress Notes (Signed)
Paula Ward , 11-Sep-1936, 82 y.o., female MRN: [id number] Patient Care Team    Relationship Specialty Notifications Start End  Hoyt Koch, MD PCP - General Internal Medicine  08/15/14     Chief Complaint  Patient presents with  . URI     Subjective: Pt presents for an OV with complaints of cough of 4 days duration.  Associated symptoms include nasal congestion, dry cough, runny nose and low grade fever. She endorses symptoms are worsening and today her throat starting hurting. She denies wheezing or shortness of breath today. She was treated and finished prednisone about 10 days ago for ILD/SOB.   Pt has tried tylenol to ease their symptoms.   Depression screen [facility] 2/9 02/01/2018 09/03/2017 01/08/2016 11/16/2014 01/30/2014  Decreased Interest 0 0 0 0 0  Down, Depressed, Hopeless 1 0 0 0 0  PHQ - 2 Score 1 0 0 0 0  Altered sleeping 0 - - - -  Tired, decreased energy 1 - - - -  Change in appetite 1 - - - -  Feeling bad or failure about yourself  1 - - - -  Trouble concentrating 0 - - - -  Moving slowly or fidgety/restless 0 - - - -  Suicidal thoughts 0 - - - -  PHQ-9 Score 4 - - - -  Difficult doing work/chores Not difficult at all - - - -  Some recent data might be hidden    Allergies  Allergen Reactions  . Codeine Nausea Only    Hallucinations, too  . Other Nausea And Vomiting    "-mycin" antibiotics.   Also cause hallucinations.  . Erythromycin Nausea And Vomiting  . Lisinopril     Pt doesn't remember    Social History   Tobacco Use  . Smoking status: Never Smoker  . Smokeless tobacco: Never Used  Substance Use Topics  . Alcohol use: No   Past Medical History:  Diagnosis Date  . Anemia   . Angiodysplasia of stomach and duodenum   . Arthus phenomenon   . AVM (arteriovenous malformation)   . Blood transfusion without reported diagnosis    last 4 units 12-22-15, Iron infusion x2 last -01-07-16,01-14-16.  . Breast cancer ([place]) 1989   Left  . Candida  esophagitis ([place])   . Cataract   . Chronic kidney disease    Chronic mild renal insuffiency  . CREST syndrome ([place])   . GERD (gastroesophageal reflux disease)    w/ HH  . Interstitial lung disease ([place])   . Nodule of kidney   . PONV (postoperative nausea and vomiting)   . Pulmonary embolus ([place]) 2003  . Pulmonary hypertension ([place])    followed by Dr Gaynell Face at [facility], now Dr. Maryjean Ka visit end 2'17.  . Rectal incontinence   . Renal cell carcinoma ([place])   . Scleroderma ([place])   . Tubular adenoma of colon   . Uterine polyp    Past Surgical History:  Procedure Laterality Date  . APPENDECTOMY  1962  . BREAST LUMPECTOMY  1989   left  . CATARACT EXTRACTION, BILATERAL Bilateral 12/2013  . ENTEROSCOPY N/A 01/18/2016   Procedure: ENTEROSCOPY;  Surgeon: Mauri Pole, MD;  [facility];  Service: Endoscopy;  Laterality: N/A;  . ENTEROSCOPY N/A 05/24/2018   Procedure: ENTEROSCOPY;  Surgeon: Jackquline Denmark, MD;  [facility];  Service: Endoscopy;  Laterality: N/A;  . ESOPHAGOGASTRODUODENOSCOPY (EGD) WITH PROPOFOL N/A 12/21/2015   Procedure: ESOPHAGOGASTRODUODENOSCOPY (EGD) WITH PROPOFOL;  Surgeon: Irene Shipper, MD;  Location: [facility];  Service: Endoscopy;  Laterality: N/A;  . HOT HEMOSTASIS N/A 05/24/2018   Procedure: HOT HEMOSTASIS (ARGON PLASMA COAGULATION/BICAP);  Surgeon: Jackquline Denmark, MD;  Location[facility];  Service: Endoscopy;  Laterality: N/A;  . IR GENERIC HISTORICAL  06/05/2016   IR RADIOLOGIST EVAL & MGMT 06/05/2016 Aletta Edouard, MD [facility] INTERV RAD  . IVC Filter    . KIDNEY SURGERY     left -"laser surgery by Dr. Kathlene Cote- 5 yrs ago" no removal  . SCHLEROTHERAPY  05/24/2018   Procedure: Woodward Ku;  Surgeon: Jackquline Denmark, MD;  [facility];  Service: Endoscopy;;  . TONSILLECTOMY    . TUBAL LIGATION     Family History  Problem Relation Age of Onset  . Bladder Cancer Father   . Diabetes Father   . Prostate cancer Father   .  Alzheimer's disease Mother   . Diabetes Sister   . Lung cancer Sister         smoker  . Esophageal cancer Paternal Uncle   . Colon cancer Neg Hx    Allergies as of 07/10/2018      Reactions   Codeine Nausea Only   Hallucinations, too   Other Nausea And Vomiting   "-mycin" antibiotics.   Also cause hallucinations.   Erythromycin Nausea And Vomiting   Lisinopril    Pt doesn't remember       Medication List        Accurate as of 07/10/18 11:34 AM. Always use your most recent med list.          acetaminophen 325 MG tablet Commonly known as:  TYLENOL Take 650 mg by mouth every 6 (six) hours as needed for mild pain.   albuterol 108 (90 Base) MCG/ACT inhaler Commonly known as:  PROVENTIL HFA;VENTOLIN HFA Inhale 2 puffs into the lungs every 6 (six) hours as needed for wheezing or shortness of breath.   ALPRAZolam 0.25 MG tablet Commonly known as:  XANAX Take 0.5 tablets (0.125 mg total) by mouth at bedtime as needed.   diltiazem 120 MG 24 hr capsule Commonly known as:  CARDIZEM CD Take 120 mg by mouth daily.   DRY MOUTH MOUTHWASH Liqd Use as directed 5 mLs in the mouth or throat as needed (dry mouth).   fluticasone 50 MCG/ACT nasal spray Commonly known as:  FLONASE instill 1 spray into each nostril twice a day if needed   meclizine 12.5 MG tablet Commonly known as:  ANTIVERT Take 1-2 tablets (12.5-25 mg total) by mouth 3 (three) times daily as needed for dizziness.   montelukast 10 MG tablet Commonly known as:  SINGULAIR Take 1 tablet (10 mg total) by mouth at bedtime.   multivitamin with minerals Tabs tablet Take 1 tablet by mouth daily. NO IRON   mupirocin cream 2 % Commonly known as:  BACTROBAN Apply 1 application topically 2 (two) times daily.   omeprazole 40 MG capsule Commonly known as:  PRILOSEC Take 1 capsule (40 mg total) by mouth 2 (two) times daily.   OPSUMIT 10 MG tablet Generic drug:  macitentan Take 1 tablet by mouth daily.   pilocarpine 5  MG tablet Commonly known as:  SALAGEN Take 1 tablet (5 mg total) by mouth 3 (three) times daily as needed.   VITAMIN D-1000 MAX ST 1000 units tablet Generic drug:  Cholecalciferol Take 3,000 Units by mouth daily.       All past medical history, surgical history, allergies, family history,  immunizations andmedications were updated in the EMR today and reviewed under the history and medication portions of their EMR.     ROS: Negative, with the exception of above mentioned in HPI   Objective:  BP (!) 122/50   Pulse 95   Temp 98.4 F (36.9 C) (Oral)   Resp 14   Wt 115 lb (52.2 kg)   SpO2 97%   BMI 19.43 kg/m  Body mass index is 19.43 kg/m. Gen: Afebrile. No acute distress. Nontoxic in appearance, well developed, well nourished.  HENT: AT. [place]. Bilateral TM visualized without erythema or fullness. MMM, no oral lesions. Bilateral nares with erythema, drainage. Throat without erythema or exudates. PND present. Mild cough present.  Eyes:Pupils Equal Round Reactive to light, Extraocular movements intact,  Conjunctiva without redness, discharge or icterus. Neck/lymp/endocrine: Supple,Chronic(per pt) left lymphadenopathy x1 CV: RRR  Chest: no wheeze. Very mild crackles bilateral bases (chronic). Good air movement, normal resp effort.  Skin: no rashes, purpura or petechiae.  Neuro:  Normal gait. PERLA. EOMi. Alert. Oriented x3  No exam data present No results found. No results found for this or any previous visit (from the past 24 hour(s)).  Assessment/Plan: IRLANDA CROGHAN is a 82 y.o. female present for OV for  1. Acute non-recurrent maxillary sinusitis VSS Rest, hydrate.  Over the counter mucinex (DM if cough) Doxycyline every 12 hours  prescribed, take until completed.  If cough present it can last up to 6-8 weeks.  F/U 10 days with PCP, sooner if worsening.    Reviewed expectations re: course of current medical issues.  Discussed self-management of symptoms.  Outlined  signs and symptoms indicating need for more acute intervention.  Patient verbalized understanding and all questions were answered.  Patient received an After-Visit Summary.    No orders of the defined types were placed in this encounter.    Note is dictated utilizing voice recognition software. Although note has been proof read prior to signing, occasional typographical errors still can be missed. If any questions arise, please do not hesitate to call for verification.   electronically signed by:  Howard Pouch, DO  [place]

## 2018-07-10 NOTE — Patient Instructions (Signed)
Rest, hydrate.  Over the counter mucinex (DM if cough) Doxycyline every 12 hours  prescribed, take until completed.  If cough present it can last up to 6-8 weeks.  F/U 10 days with PCP    Sinusitis, Adult Sinusitis is soreness and inflammation of your sinuses. Sinuses are hollow spaces in the bones around your face. They are located:  Around your eyes.  In the middle of your forehead.  Behind your nose.  In your cheekbones.  Your sinuses and nasal passages are lined with a stringy fluid (mucus). Mucus normally drains out of your sinuses. When your nasal tissues get inflamed or swollen, the mucus can get trapped or blocked so air cannot flow through your sinuses. This lets bacteria, viruses, and funguses grow, and that leads to infection. Follow these instructions at home: Medicines  Take, use, or apply over-the-counter and prescription medicines only as told by your doctor. These may include nasal sprays.  If you were prescribed an antibiotic medicine, take it as told by your doctor. Do not stop taking the antibiotic even if you start to feel better. Hydrate and Humidify  Drink enough water to keep your pee (urine) clear or pale yellow.  Use a cool mist humidifier to keep the humidity level in your home above 50%.  Breathe in steam for 10-15 minutes, 3-4 times a day or as told by your doctor. You can do this in the bathroom while a hot shower is running.  Try not to spend time in cool or dry air. Rest  Rest as much as possible.  Sleep with your head raised (elevated).  Make sure to get enough sleep each night. General instructions  Put a warm, moist washcloth on your face 3-4 times a day or as told by your doctor. This will help with discomfort.  Wash your hands often with soap and water. If there is no soap and water, use hand sanitizer.  Do not smoke. Avoid being around people who are smoking (secondhand smoke).  Keep all follow-up visits as told by your doctor.  This is important. Contact a doctor if:  You have a fever.  Your symptoms get worse.  Your symptoms do not get better within 10 days. Get help right away if:  You have a very bad headache.  You cannot stop throwing up (vomiting).  You have pain or swelling around your face or eyes.  You have trouble seeing.  You feel confused.  Your neck is stiff.  You have trouble breathing. This information is not intended to replace advice given to you by your health care provider. Make sure you discuss any questions you have with your health care provider. Document Released: 03/24/2008 Document Revised: 06/01/2016 Document Reviewed: 08/01/2015 Elsevier Interactive Patient Education  [employer].

## 2018-07-15 ENCOUNTER — Other Ambulatory Visit (INDEPENDENT_AMBULATORY_CARE_PROVIDER_SITE_OTHER): Payer: Medicare Other

## 2018-07-15 ENCOUNTER — Encounter: Payer: Self-pay | Admitting: Internal Medicine

## 2018-07-15 ENCOUNTER — Ambulatory Visit: Payer: Self-pay

## 2018-07-15 ENCOUNTER — Ambulatory Visit: Payer: Medicare Other | Admitting: Internal Medicine

## 2018-07-15 ENCOUNTER — Ambulatory Visit (INDEPENDENT_AMBULATORY_CARE_PROVIDER_SITE_OTHER)
Admission: RE | Admit: 2018-07-15 | Discharge: 2018-07-15 | Disposition: A | Discharge status: Home / Self Care | Payer: Medicare Other | Source: Ambulatory Visit | Attending: Internal Medicine | Admitting: Internal Medicine

## 2018-07-15 VITALS — BP 142/60 | HR 87 | Temp 98.4°F | Resp 18 | Ht 64.5 in | Wt 112.4 lb

## 2018-07-15 DIAGNOSIS — R062 Wheezing: Secondary | ICD-10-CM

## 2018-07-15 DIAGNOSIS — R0601 Orthopnea: Secondary | ICD-10-CM

## 2018-07-15 DIAGNOSIS — I272 Pulmonary hypertension, unspecified: Secondary | ICD-10-CM

## 2018-07-15 LAB — CBC
HCT: 32.1 % — ABNORMAL LOW (ref 36.0–46.0)
Hemoglobin: 10.9 g/dL — ABNORMAL LOW (ref 12.0–15.0)
MCHC: 33.8 g/dL (ref 30.0–36.0)
MCV: 96.1 fl (ref 78.0–100.0)
Platelets: 199 10*3/uL (ref 150.0–400.0)
RBC: 3.34 Mil/uL — ABNORMAL LOW (ref 3.87–5.11)
RDW: 17.2 % — ABNORMAL HIGH (ref 11.5–15.5)
WBC: 6.1 10*3/uL (ref 4.0–10.5)

## 2018-07-15 LAB — COMPREHENSIVE METABOLIC PANEL
ALK PHOS: 62 U/L (ref 39–117)
ALT: 10 U/L (ref 0–35)
AST: 18 U/L (ref 0–37)
Albumin: 4.1 g/dL (ref 3.5–5.2)
BILIRUBIN TOTAL: 0.6 mg/dL (ref 0.2–1.2)
BUN: 18 mg/dL (ref 6–23)
CO2: 27 mEq/L (ref 19–32)
CREATININE: 1.05 mg/dL (ref 0.40–1.20)
Calcium: 9.5 mg/dL (ref 8.4–10.5)
Chloride: 98 mEq/L (ref 96–112)
GFR: 53.61 mL/min — ABNORMAL LOW (ref >60.00)
Glucose, Bld: 94 mg/dL (ref 70–99)
Potassium: 3.6 mEq/L (ref 3.5–5.1)
SODIUM: 135 meq/L (ref 135–145)
TOTAL PROTEIN: 7 g/dL (ref 6.0–8.3)

## 2018-07-15 MED ORDER — METHYLPREDNISOLONE ACETATE 80 MG/ML IJ SUSP
80.0000 mg | Freq: Once | INTRAMUSCULAR | Status: AC
  Administered 2018-07-15: 80 mg via INTRAMUSCULAR

## 2018-07-15 MED ORDER — PREDNISONE 10 MG PO TABS: ORAL_TABLET | ORAL | 0 refills | Status: DC

## 2018-07-15 NOTE — Patient Instructions (Signed)
Have a chest xray today.    You had a steroid injection today.    Medications reviewed and updated.  Changes include :   Start the prednisone taper tomorrow.  Finish the doxycycline.    Your prescription(s) have been submitted to your pharmacy. Please take as directed and contact our office if you believe you are having problem(s) with the medication(s).   Call if no improvement

## 2018-07-15 NOTE — Telephone Encounter (Signed)
Pt. Reports she was treated last Saturday for sinus infection. States she has developed a dry ,non-productive cough. Reports she does have pulmonary hypertension. Has shortness of breath when she lays down at night, only. "I feel really worn out." Reports she still has a "lot nasal discharge" which is clear. Still on antibiotic. Appointment made for today. Reason for Disposition . [1] MODERATE longstanding difficulty breathing (e.g., speaks in phrases, SOB even at rest, pulse 100-120) AND [2] SAME as normal  Answer Assessment - Initial Assessment Questions 1. RESPIRATORY STATUS: "Describe your breathing?" (e.g., wheezing, shortness of breath, unable to speak, severe coughing)      Shortness of breath 2. ONSET: "When did this breathing problem begin?"       Last Saturday 3. PATTERN "Does the difficult breathing come and go, or has it been constant since it started?"      Constant 4. SEVERITY: "How bad is your breathing?" (e.g., mild, moderate, severe)    - MILD: No SOB at rest, mild SOB with walking, speaks normally in sentences, can lay down, no retractions, pulse < 100.    - MODERATE: SOB at rest, SOB with minimal exertion and prefers to sit, cannot lie down flat, speaks in phrases, mild retractions, audible wheezing, pulse 100-120.    - SEVERE: Very SOB at rest, speaks in single words, struggling to breathe, sitting hunched forward, retractions, pulse > 120      Hears rattling 5. RECURRENT SYMPTOM: "Have you had difficulty breathing before?" If so, ask: "When was the last time?" and "What happened that time?"      Yes 6. CARDIAC HISTORY: "Do you have any history of heart disease?" (e.g., heart attack, angina, bypass surgery, angioplasty)      No 7. LUNG HISTORY: "Do you have any history of lung disease?"  (e.g., pulmonary embolus, asthma, emphysema)     Pulmonary hypertension 8. CAUSE: "What do you think is causing the breathing problem?"      Unsure 9. OTHER SYMPTOMS: "Do you have any  other symptoms? (e.g., dizziness, runny nose, cough, chest pain, fever)     Sinus symptoms 10. PREGNANCY: "Is there any chance you are pregnant?" "When was your last menstrual period?"       No 11. TRAVEL: "Have you traveled out of the country in the last month?" (e.g., travel history, exposures)        No  Protocols used: BREATHING DIFFICULTY-A-AH

## 2018-07-15 NOTE — Progress Notes (Signed)
Subjective:    Patient ID: Paula Ward, female    DOB: 1936-01-21, 82 y.o.   MRN: [id number]  HPI She is here for an acute visit for SOB and sinus symptoms.  She does follow with [facility] for pulmonary arterial hypertension secondary to limited scleroderma.  She has a problem with recurrent bleeding likely secondary to telangiectasias.  She also has a history of a large pericardial effusion.  She had a GI bleed and needed a transfusion in early August.  Since that time she has had shortness of breath.  She was seen 9/5 by her PCP for shortness of breath.  Blood work, chest x-ray were ordered.  She was prescribed prednisone 40 mg for 5 days.  That helped and her SOB resolved.  She was seen 9/21 for cold symptoms-cough, congestion, runny nose and low-grade fever.  At that time she did not have any wheezing or shortness of breath.  She was diagnosed with acute sinusitis and prescribed doxycycline.  She is still taking the antibiotic.    She still has nasal congestion, intermittent ear pain, cough that just started - she occasionally bring up sputum.  She is most concerned about shortness of breath that is getting worse.  She is mostly just having SOB when laying flat, not when sitting or walking. She heard some gurgling in her throat or chest, but denies wheeze.    She denies fever, chest pain, palpitations and leg edema.    Medications and allergies reviewed with patient and updated if appropriate.  Patient Active Problem List   Diagnosis Date Noted  . SOB (shortness of breath) 06/24/2018  . GI bleed 05/23/2018  . Rash and nonspecific skin eruption 03/22/2018  . Dry mouth 02/15/2018  . Allergic rhinitis 11/05/2017  . Cerebellar 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 ([place]) 10/26/2017  . Routine general medical examination at a health care facility 01/30/2017  . Pericardial effusion 01/21/2016  . 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HCC)   . Osteoarthritis 02/25/2015  . Mass of  left side of neck   . CKD (chronic kidney disease) stage 3, GFR 30-59 ml/min (HCC) 09/01/2012  . RECTAL INCONTINENCE 03/27/2009  . Angiodysplasia of stomach and duodenum 08/22/2008  . ADENOCARCINOMA, BREAST, LEFT 07/26/2007  . Anemia, iron deficiency 07/26/2007  . Essential hypertension 07/26/2007  . Pulmonary hypertension ([place]) 07/26/2007  . GASTROESOPHAGEAL REFLUX DISEASE 07/26/2007  . SCLERODERMA 07/26/2007  . ILD (interstitial lung disease) ([place]) 07/26/2007  . PULMONARY EMBOLISM, HX OF 07/26/2007    Current Outpatient Medications on File Prior to Visit  Medication Sig Dispense Refill  . acetaminophen (TYLENOL) 325 MG tablet Take 650 mg by mouth every 6 (six) hours as needed for mild pain.     Marland Kitchen albuterol (PROVENTIL HFA;VENTOLIN HFA) 108 (90 Base) MCG/ACT inhaler Inhale 2 puffs into the lungs every 6 (six) hours as needed for wheezing or shortness of breath. 1 Inhaler 3  . ALPRAZolam (XANAX) 0.25 MG tablet Take 0.5 tablets (0.125 mg total) by mouth at bedtime as needed. 30 tablet 3  . Cholecalciferol (VITAMIN D-1000 MAX ST) 1000 units tablet Take 3,000 Units by mouth daily.    Marland Kitchen diltiazem (CARDIZEM CD) 120 MG 24 hr capsule Take 120 mg by mouth daily.      Marland Kitchen doxycycline (VIBRA-TABS) 100 MG tablet Take 1 tablet (100 mg total) by mouth 2 (two) times daily. 20 tablet 0  . fluticasone (FLONASE) 50 MCG/ACT nasal spray instill 1 spray into each nostril twice a day  if needed 48 g 3  . meclizine (ANTIVERT) 12.5 MG tablet Take 1-2 tablets (12.5-25 mg total) by mouth 3 (three) times daily as needed for dizziness. 30 tablet 1  . montelukast (SINGULAIR) 10 MG tablet Take 1 tablet (10 mg total) by mouth at bedtime. 30 tablet 3  . Mouthwashes (DRY MOUTH MOUTHWASH) LIQD Use as directed 5 mLs in the mouth or throat as needed (dry mouth). 473 mL 11  . Multiple Vitamin (MULTIVITAMIN WITH MINERALS) TABS tablet Take 1 tablet by mouth daily. NO IRON    . mupirocin cream (BACTROBAN) 2 % Apply 1 application  topically 2 (two) times daily. 30 g 0  . omeprazole (PRILOSEC) 40 MG capsule Take 1 capsule (40 mg total) by mouth 2 (two) times daily. 60 capsule 0  . OPSUMIT 10 MG TABS Take 1 tablet by mouth daily.     . pilocarpine (SALAGEN) 5 MG tablet Take 1 tablet (5 mg total) by mouth 3 (three) times daily as needed. 90 tablet 1   No current facility-administered medications on file prior to visit.     Past Medical History:  Diagnosis Date  . Anemia   . Angiodysplasia of stomach and duodenum   . Arthus phenomenon   . AVM (arteriovenous malformation)   . Blood transfusion without reported diagnosis    last 4 units 12-22-15, Iron infusion x2 last -01-07-16,01-14-16.  . Breast cancer ([place]) 1989   Left  . Candida esophagitis ([place])   . Cataract   . Chronic kidney disease    Chronic mild renal insuffiency  . CREST syndrome ([place])   . GERD (gastroesophageal reflux disease)    w/ HH  . Interstitial lung disease ([place])   . Nodule of kidney   . PONV (postoperative nausea and vomiting)   . Pulmonary embolus ([place]) 2003  . Pulmonary hypertension ([place])    followed by Dr Gaynell Face at [facility], now Dr. Maryjean Ka visit end 2'17.  . Rectal incontinence   . Renal cell carcinoma ([place])   . Scleroderma ([place])   . Tubular adenoma of colon   . Uterine polyp     Past Surgical History:  Procedure Laterality Date  . APPENDECTOMY  1962  . BREAST LUMPECTOMY  1989   left  . CATARACT EXTRACTION, BILATERAL Bilateral 12/2013  . ENTEROSCOPY N/A 01/18/2016   Procedure: ENTEROSCOPY;  Surgeon: Mauri Pole, MD;  [facility];  Service: Endoscopy;  Laterality: N/A;  . ENTEROSCOPY N/A 05/24/2018   Procedure: ENTEROSCOPY;  Surgeon: Jackquline Denmark, MD;  [facility];  Service: Endoscopy;  Laterality: N/A;  . ESOPHAGOGASTRODUODENOSCOPY (EGD) WITH PROPOFOL N/A 12/21/2015   Procedure: ESOPHAGOGASTRODUODENOSCOPY (EGD) WITH PROPOFOL;  Surgeon: Irene Shipper, MD;  [facility];  Service: Endoscopy;   Laterality: N/A;  . HOT HEMOSTASIS N/A 05/24/2018   Procedure: HOT HEMOSTASIS (ARGON PLASMA COAGULATION/BICAP);  Surgeon: Jackquline Denmark, MD;  Location[facility];  Service: Endoscopy;  Laterality: N/A;  . IR GENERIC HISTORICAL  06/05/2016   IR RADIOLOGIST EVAL & MGMT 06/05/2016 Aletta Edouard, MD [facility] INTERV RAD  . IVC Filter    . KIDNEY SURGERY     left -"laser surgery by Dr. Kathlene Cote- 5 yrs ago" no removal  . SCHLEROTHERAPY  05/24/2018   Procedure: Woodward Ku;  Surgeon: Jackquline Denmark, MD;  [facility];  Service: Endoscopy;;  . TONSILLECTOMY    . TUBAL LIGATION      Social History   Socioeconomic History  . Marital status: Married    Spouse name:  Not on file  . Number of children: 2  . Years of education: Not on file  . Highest education level: Not on file  Occupational History  . Occupation: Retired  Scientific laboratory technician  . Financial resource strain: Not hard at all  . Food insecurity:    Worry: Never true    Inability: Never true  . Transportation needs:    Medical: No    Non-medical: No  Tobacco Use  . Smoking status: Never Smoker  . Smokeless tobacco: Never Used  Substance and Sexual Activity  . Alcohol use: No  . Drug use: No  . Sexual activity: Never  Lifestyle  . Physical activity:    Days per week: 2 days    Minutes per session: 40 min  . Stress: Only a little  Relationships  . Social connections:    Talks on phone: More than three times a week    Gets together: More than three times a week    Attends religious service: More than 4 times per year    Active member of club or organization: Yes    Attends meetings of clubs or organizations: More than 4 times per year    Relationship status: Married  Other Topics Concern  . Not on file  Social History Narrative   Married '61   1 son- '65, 1 daughter '63; 6 children (2 adopted)   SO- SOB   Retirement- doing well   Marriage in good health   Patient has never smoked   Alcohol use- no   Pt gets  regular exercise          Family History  Problem Relation Age of Onset  . Bladder Cancer Father   . Diabetes Father   . Prostate cancer Father   . Alzheimer's disease Mother   . Diabetes Sister   . Lung cancer Sister         smoker  . Esophageal cancer Paternal Uncle   . Colon cancer Neg Hx     Review of Systems  Constitutional: Negative for chills and fever (since starting the antibiotic).       No energy  HENT: Positive for congestion and ear pain (intermittent left ear pressure). Negative for sinus pressure, sinus pain and sore throat.   Respiratory: Positive for cough (just started - occ productive, wet sounding) and shortness of breath (none with walking, just orthopnea). Negative for chest tightness and wheezing.   Cardiovascular: Negative for chest pain, palpitations and leg swelling.  Neurological: Negative for dizziness, light-headedness and headaches.       Objective:   Vitals:   07/15/18 0914  BP: (!) 142/60  Pulse: 87  Resp: 18  Temp: 98.4 F (36.9 C)  SpO2: 90%   Filed Weights   07/15/18 0914  Weight: 112 lb 6.4 oz (51 kg)   Body mass index is 19 kg/m.  Wt Readings from Last 3 Encounters:  07/15/18 112 lb 6.4 oz (51 kg)  07/10/18 115 lb (52.2 kg)  06/24/18 116 lb (52.6 kg)     Physical Exam GENERAL APPEARANCE: Appears stated age, well appearing, NAD EYES: conjunctiva clear, no icterus HEENT: bilateral tympanic membranes and ear canals normal, oropharynx with mild erythema, no thyromegaly, trachea midline, no cervical or supraclavicular lymphadenopathy LUNGS: unlabored breathing, good air entry bilaterally, end exp wheeze diffusely, no crackles CARDIOVASCULAR: Normal S1,S2 with pronounced heart sounds, 3/6 sys murmur, no edema SKIN: warm, dry        Assessment & Plan:  See Problem List for Assessment and Plan of chronic medical problems.

## 2018-07-15 NOTE — Telephone Encounter (Signed)
Appointment made with Dr. Quay Burow.

## 2018-07-16 NOTE — Assessment & Plan Note (Addendum)
SOB with laying down, not walking or sitting had similar symptoms earlier this month and it resolved with prednisone No chest pain or muffled heart sounds - she has a large pericardial effusion, which has been stable - unlikely tamponade - advised if she does have chest pain she will need to go to ED SOB likely from chronic lung disease CXR Depo-medrol 80 mg IM x 1 with prednisone taper starting tomorrow If no improvement or symptoms recur - she should see her pulmonologist

## 2018-07-16 NOTE — Assessment & Plan Note (Addendum)
Wheeze on exam likely from exacerbation of chronic lung disease cxr today Depo-medrol 80 mg IM x 1 Prednisone taper If no improvement or symptoms recur/worsen advised her to follow up with her pulmonologist

## 2018-08-02 ENCOUNTER — Encounter: Payer: Self-pay | Admitting: Internal Medicine

## 2018-08-02 MED ORDER — ALPRAZOLAM 0.25 MG PO TABS: 0.1250 mg | ORAL_TABLET | Freq: Every evening | ORAL | 3 refills | Status: DC | PRN

## 2018-08-09 ENCOUNTER — Other Ambulatory Visit: Payer: Self-pay | Admitting: Gastroenterology

## 2018-08-16 ENCOUNTER — Other Ambulatory Visit (INDEPENDENT_AMBULATORY_CARE_PROVIDER_SITE_OTHER): Payer: Medicare Other

## 2018-08-16 DIAGNOSIS — I272 Pulmonary hypertension, unspecified: Secondary | ICD-10-CM | POA: Diagnosis not present

## 2018-08-16 LAB — COMPREHENSIVE METABOLIC PANEL
ALBUMIN: 4.1 g/dL (ref 3.5–5.2)
ALT: 11 U/L (ref 0–35)
AST: 19 U/L (ref 0–37)
Alkaline Phosphatase: 48 U/L (ref 39–117)
BUN: 22 mg/dL (ref 6–23)
CALCIUM: 9.5 mg/dL (ref 8.4–10.5)
CO2: 28 mEq/L (ref 19–32)
CREATININE: 1.15 mg/dL (ref 0.40–1.20)
Chloride: 102 mEq/L (ref 96–112)
GFR: 48.26 mL/min — AB (ref >60.00)
Glucose, Bld: 103 mg/dL — ABNORMAL HIGH (ref 70–99)
POTASSIUM: 4.6 meq/L (ref 3.5–5.1)
Sodium: 138 mEq/L (ref 135–145)
Total Bilirubin: 0.5 mg/dL (ref 0.2–1.2)
Total Protein: 6.5 g/dL (ref 6.0–8.3)

## 2018-08-16 LAB — CBC
HEMATOCRIT: 31.7 % — AB (ref 36.0–46.0)
Hemoglobin: 10.7 g/dL — ABNORMAL LOW (ref 12.0–15.0)
MCHC: 33.8 g/dL (ref 30.0–36.0)
MCV: 100.4 fl — ABNORMAL HIGH (ref 78.0–100.0)
PLATELETS: 286 10*3/uL (ref 150.0–400.0)
RBC: 3.16 Mil/uL — AB (ref 3.87–5.11)
RDW: 17.4 % — ABNORMAL HIGH (ref 11.5–15.5)
WBC: 4.4 10*3/uL (ref 4.0–10.5)

## 2018-09-03 ENCOUNTER — Ambulatory Visit: Payer: Self-pay | Admitting: *Deleted

## 2018-09-03 ENCOUNTER — Other Ambulatory Visit: Payer: Self-pay | Admitting: Internal Medicine

## 2018-09-03 ENCOUNTER — Encounter: Payer: Self-pay | Admitting: Internal Medicine

## 2018-09-03 ENCOUNTER — Other Ambulatory Visit (INDEPENDENT_AMBULATORY_CARE_PROVIDER_SITE_OTHER): Payer: Medicare Other

## 2018-09-03 ENCOUNTER — Ambulatory Visit: Payer: Medicare Other | Admitting: Internal Medicine

## 2018-09-03 VITALS — BP 138/60 | HR 84 | Temp 98.1°F | Ht 64.5 in | Wt 120.0 lb

## 2018-09-03 DIAGNOSIS — M7989 Other specified soft tissue disorders: Secondary | ICD-10-CM | POA: Insufficient documentation

## 2018-09-03 DIAGNOSIS — I313 Pericardial effusion (noninflammatory): Secondary | ICD-10-CM

## 2018-09-03 DIAGNOSIS — I3139 Other pericardial effusion (noninflammatory): Secondary | ICD-10-CM

## 2018-09-03 LAB — BRAIN NATRIURETIC PEPTIDE: Pro B Natriuretic peptide (BNP): 1025 pg/mL — ABNORMAL HIGH (ref 0.0–100.0)

## 2018-09-03 NOTE — Assessment & Plan Note (Addendum)
Checking BNP and suspect it will be elevated given her heart and lung disease and informed her that checking ECHO would be next step to evaluation of change in cardiac function. Recent CMP rules out kidney or liver changes. Rx for compression stockings today.

## 2018-09-03 NOTE — Telephone Encounter (Signed)
Noted  

## 2018-09-03 NOTE — Progress Notes (Signed)
   Subjective:    Patient ID: Paula Ward, female    DOB: 06-Aug-1936, 82 y.o.   MRN: [id number]  HPI The patient is an 82 YO female coming in for swelling of her legs. Worse in the afternoon. She does try to prop them up. Going on for several weeks off and on. Denies fevers or chills. Denies SOB when walking or lying flat in bed. Denies worsening cough. Denies taking anything for this. Denies rash on her legs. Does have concurrent pericardial effusion (previously stable, last echo last year [facility]) and lung disease.   Review of Systems  Constitutional: Negative.   HENT: Negative.   Eyes: Negative.   Respiratory: Negative for cough, chest tightness and shortness of breath.   Cardiovascular: Positive for leg swelling. Negative for chest pain and palpitations.  Gastrointestinal: Negative for abdominal distention, abdominal pain, constipation, diarrhea, nausea and vomiting.  Musculoskeletal: Negative.   Skin: Negative.   Neurological: Negative.       Objective:   Physical Exam  Constitutional: She is oriented to person, place, and time. She appears well-developed and well-nourished.  HENT:  Head: Normocephalic and atraumatic.  Eyes: EOM are normal.  Neck: Normal range of motion.  Cardiovascular: Normal rate and regular rhythm.  Pulmonary/Chest: Effort normal and breath sounds normal. No respiratory distress. She has no wheezes. She has no rales.  Abdominal: Soft. Bowel sounds are normal. She exhibits no distension. There is no tenderness. There is no rebound.  Musculoskeletal: She exhibits edema.  1+ edema to mid shin bilaterally slight pitting  Neurological: She is alert and oriented to person, place, and time. Coordination normal.  Skin: Skin is warm and dry.   Vitals:   09/03/18 1446  BP: 138/60  Pulse: 84  Temp: 98.1 F (36.7 C)  TempSrc: Oral  SpO2: 96%  Weight: 120 lb (54.4 kg)  Height: 5' 4.5" (1.638 m)      Assessment & Plan:

## 2018-09-03 NOTE — Telephone Encounter (Addendum)
The pt called with complaints of bilateral leg swelling; L >R; she says that it started at her ankles but last pm it was up to her calves;s he notices the swelling in the afternoon; the pt also said that there has bee times when she had a hard time getting her shoe on;  the pt says that she takes B12 periodically for swelling; recommendations made per nurse triage protocol; per [facility] agent Constance Holster, pt offered and accepted appointment with Dr Pricilla Holm, [facility], 09/03/18 at 1500; the [t verbalized understanding; will route to office for notification of this upcoming appointment,   Reason for Disposition . [1] MODERATE leg swelling (e.g., swelling extends up to knees) AND [2] new onset or worsening  Answer Assessment - Initial Assessment Questions 1. ONSET: "When did the swelling start?" (e.g., minutes, hours, days)     days 2. LOCATION: "What part of the leg is swollen?"  "Are both legs swollen or just one leg?"    Both legs L>R; foot to calg 3. SEVERITY: "How bad is the swelling?" (e.g., localized; mild, moderate, severe)  - Localized - small area of swelling localized to one leg  - MILD pedal edema - swelling limited to foot and ankle, pitting edema < 1/4 inch (6 mm) deep, rest and elevation eliminate most or all swelling  - MODERATE edema - swelling of lower leg to knee, pitting edema > 1/4 inch (6 mm) deep, rest and elevation only partially reduce swelling  - SEVERE edema - swelling extends above knee, facial or hand swelling present     Mild to moderate 4. REDNESS: "Does the swelling look red or infected?"     no 5. PAIN: "Is the swelling painful to touch?" If so, ask: "How painful is it?"   (Scale 1-10; mild, moderate or severe)     no 6. FEVER: "Do you have a fever?" If so, ask: "What is it, how was it measured, and when did it start?"      no 7. CAUSE: "What do you think is causing the leg swelling?"     Not sure 8. MEDICAL HISTORY: "Do you have a history of heart failure, kidney  disease, liver failure, or cancer?"     Pulmonary hypertension, hypertension 9. RECURRENT SYMPTOM: "Have you had leg swelling before?" If so, ask: "When was the last time?" "What happened that time?"     no 10. OTHER SYMPTOMS: "Do you have any other symptoms?" (e.g., chest pain, difficulty breathing)       no 11. PREGNANCY: "Is there any chance you are pregnant?" "When was your last menstrual period?"       no  Protocols used: LEG SWELLING AND EDEMA-A-AH

## 2018-09-03 NOTE — Patient Instructions (Signed)
We have given you the prescription for the compression stockings.   We are checking the blood work today and may need to do another echo of the heart.

## 2018-09-07 ENCOUNTER — Other Ambulatory Visit: Payer: Self-pay | Admitting: Gastroenterology

## 2018-09-07 ENCOUNTER — Other Ambulatory Visit: Payer: Self-pay | Admitting: Internal Medicine

## 2018-09-08 ENCOUNTER — Ambulatory Visit (HOSPITAL_COMMUNITY): Payer: Medicare Other | Attending: Cardiology

## 2018-09-08 ENCOUNTER — Other Ambulatory Visit: Payer: Self-pay

## 2018-09-08 DIAGNOSIS — I313 Pericardial effusion (noninflammatory): Secondary | ICD-10-CM | POA: Diagnosis present

## 2018-09-08 DIAGNOSIS — I3139 Other pericardial effusion (noninflammatory): Secondary | ICD-10-CM

## 2018-09-09 ENCOUNTER — Encounter: Payer: Self-pay | Admitting: Internal Medicine

## 2018-09-10 MED ORDER — PANTOPRAZOLE SODIUM 40 MG PO TBEC: 40.0000 mg | DELAYED_RELEASE_TABLET | Freq: Every day | ORAL | 3 refills | Status: DC

## 2018-09-14 ENCOUNTER — Other Ambulatory Visit (INDEPENDENT_AMBULATORY_CARE_PROVIDER_SITE_OTHER): Payer: Medicare Other

## 2018-09-14 DIAGNOSIS — I272 Pulmonary hypertension, unspecified: Secondary | ICD-10-CM

## 2018-09-14 LAB — COMPREHENSIVE METABOLIC PANEL
ALK PHOS: 54 U/L (ref 39–117)
ALT: 13 U/L (ref 0–35)
AST: 22 U/L (ref 0–37)
Albumin: 4.3 g/dL (ref 3.5–5.2)
BUN: 25 mg/dL — AB (ref 6–23)
CHLORIDE: 100 meq/L (ref 96–112)
CO2: 26 meq/L (ref 19–32)
Calcium: 9.4 mg/dL (ref 8.4–10.5)
Creatinine, Ser: 1.25 mg/dL — ABNORMAL HIGH (ref 0.40–1.20)
GFR: 43.82 mL/min — AB (ref >60.00)
GLUCOSE: 143 mg/dL — AB (ref 70–99)
POTASSIUM: 4.3 meq/L (ref 3.5–5.1)
SODIUM: 136 meq/L (ref 135–145)
Total Bilirubin: 0.5 mg/dL (ref 0.2–1.2)
Total Protein: 6.7 g/dL (ref 6.0–8.3)

## 2018-09-14 LAB — CBC
HCT: 30.6 % — ABNORMAL LOW (ref 36.0–46.0)
Hemoglobin: 10.5 g/dL — ABNORMAL LOW (ref 12.0–15.0)
MCHC: 34.3 g/dL (ref 30.0–36.0)
MCV: 102 fl — AB (ref 78.0–100.0)
PLATELETS: 255 10*3/uL (ref 150.0–400.0)
RBC: 3 Mil/uL — ABNORMAL LOW (ref 3.87–5.11)
RDW: 15.4 % (ref 11.5–15.5)
WBC: 8.3 10*3/uL (ref 4.0–10.5)

## 2018-09-17 ENCOUNTER — Ambulatory Visit: Payer: Self-pay

## 2018-09-17 NOTE — Telephone Encounter (Signed)
Pt. Reports she was bending over yesterday and her low back began hurting after that.Denies any radiation of pain or numbness in legs. "Hurts when I first get up, but as I move around it is a little better."Heat helps." Requesting her provider call in a muscle relaxer Monday. Please advise pt.  Answer Assessment - Initial Assessment Questions 1. ONSET: "When did the pain begin?"      Yesterday 2. LOCATION: "Where does it hurt?" (upper, mid or lower back)     Low back pain 3. SEVERITY: "How bad is the pain?"  (e.g., Scale 1-10; mild, moderate, or severe)   - MILD (1-3): doesn't interfere with normal activities    - MODERATE (4-7): interferes with normal activities or awakens from sleep    - SEVERE (8-10): excruciating pain, unable to do any normal activities      9-10 4. PATTERN: "Is the pain constant?" (e.g., yes, no; constant, intermittent)      Hurts worse with movement 5. RADIATION: "Does the pain shoot into your legs or elsewhere?"     No 6. CAUSE:  "What do you think is causing the back pain?"      I bent over yesterday and it started 7. BACK OVERUSE:  "Any recent lifting of heavy objects, strenuous work or exercise?"     Yes - just bending over 8. MEDICATIONS: "What have you taken so far for the pain?" (e.g., nothing, acetaminophen, NSAIDS)     Tylenol 9. NEUROLOGIC SYMPTOMS: "Do you have any weakness, numbness, or problems with bowel/bladder control?"     No 10. OTHER SYMPTOMS: "Do you have any other symptoms?" (e.g., fever, abdominal pain, burning with urination, blood in urine)       No 11. PREGNANCY: "Is there any chance you are pregnant?" (e.g., yes, no; LMP)       N/a  Protocols used: BACK PAIN-A-AH

## 2018-09-20 NOTE — Telephone Encounter (Signed)
Patient will need visit to discuss

## 2018-09-24 ENCOUNTER — Ambulatory Visit: Payer: Medicare Other | Admitting: Family Medicine

## 2018-10-07 ENCOUNTER — Other Ambulatory Visit: Payer: Self-pay | Admitting: Gastroenterology

## 2018-10-08 NOTE — Telephone Encounter (Signed)
error

## 2018-10-10 ENCOUNTER — Other Ambulatory Visit: Payer: Self-pay

## 2018-10-10 ENCOUNTER — Emergency Department (HOSPITAL_BASED_OUTPATIENT_CLINIC_OR_DEPARTMENT_OTHER): Payer: Medicare Other

## 2018-10-10 ENCOUNTER — Encounter (HOSPITAL_BASED_OUTPATIENT_CLINIC_OR_DEPARTMENT_OTHER): Payer: Self-pay | Admitting: Emergency Medicine

## 2018-10-10 ENCOUNTER — Emergency Department (HOSPITAL_BASED_OUTPATIENT_CLINIC_OR_DEPARTMENT_OTHER)
Admission: EM | Admit: 2018-10-10 | Discharge: 2018-10-10 | Disposition: A | Discharge status: Home / Self Care | Payer: Medicare Other | Attending: Emergency Medicine | Admitting: Emergency Medicine

## 2018-10-10 DIAGNOSIS — N183 Chronic kidney disease, stage 3 (moderate): Secondary | ICD-10-CM | POA: Insufficient documentation

## 2018-10-10 DIAGNOSIS — S01112A Laceration without foreign body of left eyelid and periocular area, initial encounter: Secondary | ICD-10-CM | POA: Diagnosis not present

## 2018-10-10 DIAGNOSIS — W19XXXA Unspecified fall, initial encounter: Secondary | ICD-10-CM

## 2018-10-10 DIAGNOSIS — Y9389 Activity, other specified: Secondary | ICD-10-CM | POA: Diagnosis not present

## 2018-10-10 DIAGNOSIS — W06XXXA Fall from bed, initial encounter: Secondary | ICD-10-CM | POA: Diagnosis not present

## 2018-10-10 DIAGNOSIS — I129 Hypertensive chronic kidney disease with stage 1 through stage 4 chronic kidney disease, or unspecified chronic kidney disease: Secondary | ICD-10-CM | POA: Insufficient documentation

## 2018-10-10 DIAGNOSIS — Y92003 Bedroom of unspecified non-institutional (private) residence as the place of occurrence of the external cause: Secondary | ICD-10-CM | POA: Diagnosis not present

## 2018-10-10 DIAGNOSIS — S0990XA Unspecified injury of head, initial encounter: Secondary | ICD-10-CM

## 2018-10-10 DIAGNOSIS — Z8553 Personal history of malignant neoplasm of renal pelvis: Secondary | ICD-10-CM | POA: Diagnosis not present

## 2018-10-10 DIAGNOSIS — Z79899 Other long term (current) drug therapy: Secondary | ICD-10-CM | POA: Insufficient documentation

## 2018-10-10 DIAGNOSIS — S0181XA Laceration without foreign body of other part of head, initial encounter: Secondary | ICD-10-CM

## 2018-10-10 DIAGNOSIS — Z853 Personal history of malignant neoplasm of breast: Secondary | ICD-10-CM | POA: Diagnosis not present

## 2018-10-10 DIAGNOSIS — Y998 Other external cause status: Secondary | ICD-10-CM | POA: Insufficient documentation

## 2018-10-10 MED ORDER — ACETAMINOPHEN 500 MG PO TABS
1000.0000 mg | ORAL_TABLET | Freq: Once | ORAL | Status: AC
  Administered 2018-10-10: 1000 mg via ORAL
  Filled 2018-10-10: qty 2

## 2018-10-10 MED ORDER — BACITRACIN ZINC 500 UNIT/GM EX OINT: 1.0000 "application " | TOPICAL_OINTMENT | Freq: Two times a day (BID) | CUTANEOUS | 0 refills | Status: DC

## 2018-10-10 MED ORDER — LIDOCAINE-EPINEPHRINE (PF) 2 %-1:200000 IJ SOLN
10.0000 mL | Freq: Once | INTRAMUSCULAR | Status: AC
  Administered 2018-10-10: 10 mL
  Filled 2018-10-10 (×2): qty 10

## 2018-10-10 MED ORDER — ACETAMINOPHEN 500 MG PO TABS: 1000.0000 mg | ORAL_TABLET | Freq: Four times a day (QID) | ORAL | 0 refills | Status: DC | PRN

## 2018-10-10 NOTE — ED Notes (Signed)
Pt states she fell out of bed after becoming entangled in her sheets. She states she hit left side of her head on the floor. She staes no loss of consciousness.  Pt denies dizziness, nausea or vomiting. Orbital hematoma around left eye, and laceration to left side of forehead

## 2018-10-10 NOTE — ED Notes (Addendum)
ED Provider at bedside.to suture lac

## 2018-10-10 NOTE — ED Provider Notes (Signed)
[place] EMERGENCY DEPARTMENT Provider Note   CSN: [id number] Arrival date & time: 10/10/18  1039     History   Chief Complaint Chief Complaint  Patient presents with  . Fall  . Laceration    HPI Paula Ward is a 82 y.o. female.  HPI Patient reports that she was getting out of bed and she got her sheets tangled around her feet.  That caused her to fall out of her bed headfirst.  She reports she did not get knocked out.  She reports she had to sit there for about 30 minutes however to get control over the bleeding.  Denies she has generalized headache.  She reports it hurts right around her laceration.  She denies she has any neck pain.  No weakness numbness or tingling to her extremities.  She denies any pain with movement of the shoulders or the hips.  Patient is not on any blood thinners. Past Medical History:  Diagnosis Date  . Anemia   . Angiodysplasia of stomach and duodenum   . Arthus phenomenon   . AVM (arteriovenous malformation)   . Blood transfusion without reported diagnosis    last 4 units 12-22-15, Iron infusion x2 last -01-07-16,01-14-16.  . Breast cancer ([place]) 1989   Left  . Candida esophagitis ([place])   . Cataract   . Chronic kidney disease    Chronic mild renal insuffiency  . CREST syndrome ([place])   . GERD (gastroesophageal reflux disease)    w/ HH  . Interstitial lung disease ([place])   . Nodule of kidney   . PONV (postoperative nausea and vomiting)   . Pulmonary embolus ([place]) 2003  . Pulmonary hypertension ([place])    followed by Dr Gaynell Face at [facility], now Dr. Maryjean Ka visit end 2'17.  . Rectal incontinence   . Renal cell carcinoma ([place])   . Scleroderma ([place])   . Tubular adenoma of colon   . Uterine polyp     Patient Active Problem List   Diagnosis Date Noted  . Leg swelling 09/03/2018  . Orthopnea 06/24/2018  . GI bleed 05/23/2018  . Rash and nonspecific skin eruption 03/22/2018  . Dry mouth 02/15/2018  . Allergic rhinitis  11/05/2017  . Cerebellar 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 ([place]) 10/26/2017  . Routine general medical examination at a health care facility 01/30/2017  . Pericardial effusion 01/21/2016  . 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HCC)   . Osteoarthritis 02/25/2015  . Mass of left side of neck   . CKD (chronic kidney disease) stage 3, GFR 30-59 ml/min (HCC) 09/01/2012  . RECTAL INCONTINENCE 03/27/2009  . Angiodysplasia of stomach and duodenum 08/22/2008  . ADENOCARCINOMA, BREAST, LEFT 07/26/2007  . Anemia, iron deficiency 07/26/2007  . Essential hypertension 07/26/2007  . Pulmonary hypertension ([place]) 07/26/2007  . GASTROESOPHAGEAL REFLUX DISEASE 07/26/2007  . SCLERODERMA 07/26/2007  . ILD (interstitial lung disease) ([place]) 07/26/2007  . PULMONARY EMBOLISM, HX OF 07/26/2007    Past Surgical History:  Procedure Laterality Date  . APPENDECTOMY  1962  . BREAST LUMPECTOMY  1989   left  . CATARACT EXTRACTION, BILATERAL Bilateral 12/2013  . ENTEROSCOPY N/A 01/18/2016   Procedure: ENTEROSCOPY;  Surgeon: Mauri Pole, MD;  Location: [facility] ENDOSCOPY;  Service: Endoscopy;  Laterality: N/A;  . ENTEROSCOPY N/A 05/24/2018   Procedure: ENTEROSCOPY;  Surgeon: Jackquline Denmark, MD;  Location: [facility] ENDOSCOPY;  Service: Endoscopy;  Laterality: N/A;  . ESOPHAGOGASTRODUODENOSCOPY (EGD) WITH PROPOFOL N/A 12/21/2015   Procedure: ESOPHAGOGASTRODUODENOSCOPY (EGD) WITH PROPOFOL;  Surgeon: Irene Shipper, MD;  Location: [facility];  Service: Endoscopy;  Laterality: N/A;  . HOT HEMOSTASIS N/A 05/24/2018   Procedure: HOT HEMOSTASIS (ARGON PLASMA COAGULATION/BICAP);  Surgeon: Jackquline Denmark, MD;  Location: [facility];  Service: Endoscopy;  Laterality: N/A;  . IR GENERIC HISTORICAL  06/05/2016   IR RADIOLOGIST EVAL & MGMT 06/05/2016 Aletta Edouard, MD [facility] INTERV RAD  . IVC Filter    . KIDNEY SURGERY     left -"laser surgery by Dr. Kathlene Cote- 5 yrs ago" no removal  . SCHLEROTHERAPY  05/24/2018   Procedure: Woodward Ku;   Surgeon: Jackquline Denmark, MD;  Location: [facility] ENDOSCOPY;  Service: Endoscopy;;  . TONSILLECTOMY    . TUBAL LIGATION       OB History   No obstetric history on file.      Home Medications    Prior to Admission medications   Medication Sig Start Date End Date Taking? Authorizing Provider  Cholecalciferol (VITAMIN D-1000 MAX ST) 1000 units tablet Take 3,000 Units by mouth daily.   Yes [provider]  diltiazem (CARDIZEM CD) 120 MG 24 hr capsule Take 120 mg by mouth daily.     Yes [provider]  Mouthwashes (DRY MOUTH MOUTHWASH) LIQD Use as directed 5 mLs in the mouth or throat as needed (dry mouth). 02/15/18  Yes Hoyt Koch, MD  Multiple Vitamin (MULTIVITAMIN WITH MINERALS) TABS tablet Take 1 tablet by mouth daily. NO IRON   Yes [provider]  omeprazole (PRILOSEC) 40 MG capsule TAKE 1 CAPSULE BY MOUTH TWICE DAILY 10/07/18  Yes Nandigam, Kavitha V, MD  OPSUMIT 10 MG TABS Take 1 tablet by mouth daily.  08/16/15  Yes [provider]  acetaminophen (TYLENOL) 325 MG tablet Take 650 mg by mouth every 6 (six) hours as needed for mild pain.     [provider]  acetaminophen (TYLENOL) 500 MG tablet Take 2 tablets (1,000 mg total) by mouth every 6 (six) hours as needed. 10/10/18   Charlesetta Shanks, MD  albuterol (PROVENTIL HFA;VENTOLIN HFA) 108 (90 Base) MCG/ACT inhaler INHALE 2 PUFFS INTO THE LUNGS EVERY 6 HOURS AS NEEDED FOR WHEEZING OR SHORTNESS OF BREATH 09/07/18   Hoyt Koch, MD  ALPRAZolam Duanne Moron) 0.25 MG tablet Take 0.5 tablets (0.125 mg total) by mouth at bedtime as needed. 08/02/18   Hoyt Koch, MD  bacitracin ointment Apply 1 application topically 2 (two) times daily. 10/10/18   Charlesetta Shanks, MD  fluticasone Asencion Islam) 50 MCG/ACT nasal spray instill 1 spray into each nostril twice a day if needed 11/05/17   Hoyt Koch, MD  meclizine (ANTIVERT) 12.5 MG tablet Take 1-2 tablets (12.5-25 mg total) by mouth  3 (three) times daily as needed for dizziness. 10/26/17   Binnie Rail, MD  montelukast (SINGULAIR) 10 MG tablet TAKE 1 TABLET(10 MG) BY MOUTH AT BEDTIME 09/06/18   Hoyt Koch, MD  mupirocin cream (BACTROBAN) 2 % Apply 1 application topically 2 (two) times daily. 03/22/18   Binnie Rail, MD  pantoprazole (PROTONIX) 40 MG tablet Take 1 tablet (40 mg total) by mouth daily. 09/10/18   Hoyt Koch, MD  pilocarpine (SALAGEN) 5 MG tablet Take 1 tablet (5 mg total) by mouth 3 (three) times daily as needed. 02/15/18   Hoyt Koch, MD    Family History Family History  Problem Relation Age of Onset  . Bladder Cancer Father   . Diabetes Father   . Prostate cancer Father   . Alzheimer's  disease Mother   . Diabetes Sister   . Lung cancer Sister         smoker  . Esophageal cancer Paternal Uncle   . Colon cancer Neg Hx     Social History Social History   Tobacco Use  . Smoking status: Never Smoker  . Smokeless tobacco: Never Used  Substance Use Topics  . Alcohol use: No  . Drug use: No     Allergies   Codeine; Other; Erythromycin; and Lisinopril   Review of Systems Review of Systems 10 Systems reviewed and are negative for acute change except as noted in the HPI.   Physical Exam Updated Vital Signs BP (!) 149/65 (BP Location: Left Arm)   Pulse 80   Temp 98.2 F (36.8 C) (Oral)   Resp 16   Ht _0  (1.626 m)   Wt 50.8 kg   SpO2 94%   BMI 19.22 kg/m   Physical Exam Constitutional:      Comments: Patient is alert and interactive.  No acute distress.  She has a large hematoma and bruising to the left forehead.  HENT:     Head:     Comments: Large hematoma with ecchymosis around the left brow and eye.  Eye still opens spontaneously.  3 cm gaping laceration above the brow.  Bleeding from the nose.  Normal range of motion of jaw.  Dentition intact. Eyes:     Extraocular Movements: Extraocular movements intact.     Pupils: Pupils are equal,  round, and reactive to light.  Neck:     Comments: No C-spine tenderness to palpation. Cardiovascular:     Rate and Rhythm: Normal rate and regular rhythm.     Comments: 2\6 systolic ejection murmur. Pulmonary:     Effort: Pulmonary effort is normal.     Comments: Crackles bilateral lower lung fields. Abdominal:     General: Abdomen is flat. There is no distension.     Tenderness: There is no abdominal tenderness. There is no guarding.  Musculoskeletal: Normal range of motion.     Comments: Normal range of motion of the shoulders and arms bilaterally.  No pain to compression over the hips.  Normal range of motion of the hips and the knees with good strength to extend against resistance.  Skin:    General: Skin is warm and dry.  Neurological:     General: No focal deficit present.     Mental Status: She is oriented to person, place, and time.     Cranial Nerves: No cranial nerve deficit.     Motor: No weakness.     Coordination: Coordination normal.  Psychiatric:        Mood and Affect: Mood normal.      ED Treatments / Results  Labs (all labs ordered are listed, but only abnormal results are displayed) Labs Reviewed - No data to display  EKG None  Radiology Ct Head Wo Contrast  Result Date: 10/10/2018 CLINICAL DATA:  Fall this morning striking face with left periorbital swelling. EXAM: CT HEAD WITHOUT CONTRAST CT MAXILLOFACIAL WITHOUT CONTRAST TECHNIQUE: Multidetector CT imaging of the head and maxillofacial structures were performed using the standard protocol without intravenous contrast. Multiplanar CT image reconstructions of the maxillofacial structures were also generated. COMPARISON:  Multiple exams, including 06/06/2018 FINDINGS: CT HEAD FINDINGS Brain: The brainstem, cerebellum, cerebral peduncles, thalami, basal ganglia, basilar cisterns, and ventricular system appear within normal limits. Periventricular white matter and corona radiata hypodensities favor chronic  ischemic microvascular  white matter disease. No intracranial hemorrhage, mass lesion, or acute CVA. Vascular: There is atherosclerotic calcification of the cavernous carotid arteries bilaterally. Skull: Unremarkable Other: Left periorbital subcutaneous hematoma and the left frontal scalp laceration with overlying bandaging. CT MAXILLOFACIAL FINDINGS Osseous: No facial fracture identified. Irregularity, spurring, and diminution in size of the right mandibular condyle compatible with chronic arthropathy. Orbits: Notable left periorbital soft tissue swelling no intraorbital abnormality. The globes appear intact. Sinuses: Trace frothy material in the right frontal sinus. Mild chronic right sphenoid sinusitis. Soft tissues: Left facial soft tissue swelling overlying the maxilla. Left frontal scalp laceration. Bilateral common carotid atherosclerotic calcification. IMPRESSION: 1. No acute intracranial findings or facial fractures. 2. Notable left periorbital soft tissue swelling without intraorbital abnormality. 3. Left facial soft tissue swelling.  Left frontal scalp laceration. 4. Chronic right frontal and right sphenoid sinusitis. 5. 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 6. Atherosclerosis. Electronically Signed   By: Van Clines M.D.   On: 10/10/2018 13:04   Ct Maxillofacial Wo Cm  Result Date: 10/10/2018 CLINICAL DATA:  Fall this morning striking face with left periorbital swelling. EXAM: CT HEAD WITHOUT CONTRAST CT MAXILLOFACIAL WITHOUT CONTRAST TECHNIQUE: Multidetector CT imaging of the head and maxillofacial structures were performed using the standard protocol without intravenous contrast. Multiplanar CT image reconstructions of the maxillofacial structures were also generated. COMPARISON:  Multiple exams, including 06/06/2018 FINDINGS: CT HEAD FINDINGS Brain: The brainstem, cerebellum, cerebral peduncles, thalami, basal ganglia, basilar cisterns, and ventricular  system appear within normal limits. Periventricular white matter and corona radiata hypodensities favor chronic ischemic microvascular white matter disease. No intracranial hemorrhage, mass lesion, or acute CVA. Vascular: There is atherosclerotic calcification of the cavernous carotid arteries bilaterally. Skull: Unremarkable Other: Left periorbital subcutaneous hematoma and the left frontal scalp laceration with overlying bandaging. CT MAXILLOFACIAL FINDINGS Osseous: No facial fracture identified. Irregularity, spurring, and diminution in size of the right mandibular condyle compatible with chronic arthropathy. Orbits: Notable left periorbital soft tissue swelling no intraorbital abnormality. The globes appear intact. Sinuses: Trace frothy material in the right frontal sinus. Mild chronic right sphenoid sinusitis. Soft tissues: Left facial soft tissue swelling overlying the maxilla. Left frontal scalp laceration. Bilateral common carotid atherosclerotic calcification. IMPRESSION: 1. No acute intracranial findings or facial fractures. 2. Notable left periorbital soft tissue swelling without intraorbital abnormality. 3. Left facial soft tissue swelling.  Left frontal scalp laceration. 4. Chronic right frontal and right sphenoid sinusitis. 5. 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 6. Atherosclerosis. Electronically Signed   By: Van Clines M.D.   On: 10/10/2018 13:04    Procedures .Marland KitchenLaceration Repair Date/Time: 10/10/2018 2:22 PM Performed by: Charlesetta Shanks, MD Authorized by: Charlesetta Shanks, MD   Consent:    Consent obtained:  Verbal   Consent given by:  Patient   Risks discussed:  Infection, need for additional repair, poor wound healing, poor cosmetic result and pain Anesthesia (see MAR for exact dosages):    Anesthesia method:  Local infiltration   Local anesthetic:  Lidocaine 2% WITH epi Laceration details:    Location:  Face   Face location:  L  eyebrow   Length (cm):  3   Depth (mm):  5 Repair type:    Repair type:  Complex Pre-procedure details:    Preparation:  Patient was prepped and draped in usual sterile fashion Exploration:    Hemostasis achieved with:  Epinephrine   Wound exploration: entire depth of wound probed and visualized  Wound extent: areolar tissue violated   Treatment:    Area cleansed with:  Saline and Shur-Clens   Amount of cleaning:  Standard   Irrigation solution:  Sterile saline   Irrigation volume:  40   Irrigation method:  Syringe   Debridement:  None   Undermining:  None Skin repair:    Repair method:  Sutures   Suture size:  5-0   Suture material:  Nylon   Suture technique:  Simple interrupted   Number of sutures:  10 Approximation:    Approximation:  Close Post-procedure details:    Dressing:  Antibiotic ointment   Patient tolerance of procedure:  Tolerated well, no immediate complications   (including critical care time)  Medications Ordered in ED Medications  acetaminophen (TYLENOL) tablet 1,000 mg (1,000 mg Oral Given 10/10/18 1242)  lidocaine-EPINEPHrine (XYLOCAINE W/EPI) 2 %-1:200000 (PF) injection 10 mL (10 mLs Infiltration Given 10/10/18 1242)     Initial Impression / Assessment and Plan / ED Course  I have reviewed the triage vital signs and the nursing notes.  Pertinent labs & imaging results that were available during my care of the patient were reviewed by me and considered in my medical decision making (see chart for details).    Had a mechanical fall.  No other injury identified besides large laceration to the forehead.  CT head and facial bones did not show facial fracture.  Patient has normal range of motion of the eyes.  She has no neurologic dysfunction.  Mental status is clear.  Normal range of motion of extremities without any chest pain or signs of other intrathoracic or abdominal injury.  Return precautions reviewed.  Final Clinical Impressions(s) / ED  Diagnoses   Final diagnoses:  Facial laceration, initial encounter  Injury of head, initial encounter  Fall, initial encounter    ED Discharge Orders         Ordered    bacitracin ointment  2 times daily     10/10/18 1417    acetaminophen (TYLENOL) 500 MG tablet  Every 6 hours PRN     10/10/18 1417           Charlesetta Shanks, MD 10/10/18 1426

## 2018-10-10 NOTE — ED Notes (Signed)
Suture cart available, at room 9

## 2018-10-10 NOTE — Discharge Instructions (Addendum)
1.  Try to leave the compression dressing on your head for about 6 hours after your visit.  Your head elevated at about 30 degrees if you are lying down in bed.  Elevation will help with swelling. 2.  Take Tylenol for pain if needed. 3.  Return to the emergency department if you are having severe headache, confusion, vomiting or any other concerning symptoms. 4.  Schedule an appointment with your doctor for recheck in about 1 week for removal of stitches.

## 2018-10-10 NOTE — ED Triage Notes (Signed)
Pt here with mechanical fall this morning. Feet got tangled in sheets and fell out of bed and onto floor. No LOC, but there is a large laceration above left eye with profound swelling and bruising around eye. Bleeding seems to be controlled in triage.

## 2018-10-15 ENCOUNTER — Telehealth: Payer: Self-pay

## 2018-10-15 NOTE — Telephone Encounter (Signed)
Pharmacy sent over paper stating patient was on both omeprazole and pantoprazole.  I called her to clarify. She switched herself back to omeprazole because she couldn't tell any difference with the pantoprazole that her PCP rx'ed.   While on the phone she stated she is still having issues with her GERD, being tired, and wt. Loss. She declined to see a PA so appointment made with Dr Silverio Decamp for February. She had labs in November and her hgb. Was stable per her PCP.

## 2018-10-18 ENCOUNTER — Other Ambulatory Visit (INDEPENDENT_AMBULATORY_CARE_PROVIDER_SITE_OTHER): Payer: Medicare Other

## 2018-10-18 ENCOUNTER — Encounter: Payer: Self-pay | Admitting: Internal Medicine

## 2018-10-18 ENCOUNTER — Ambulatory Visit (INDEPENDENT_AMBULATORY_CARE_PROVIDER_SITE_OTHER): Payer: Medicare Other | Admitting: Internal Medicine

## 2018-10-18 VITALS — BP 110/50 | HR 80 | Temp 97.9°F | Ht 64.0 in | Wt 114.0 lb

## 2018-10-18 DIAGNOSIS — I272 Pulmonary hypertension, unspecified: Secondary | ICD-10-CM

## 2018-10-18 DIAGNOSIS — G3281 Cerebellar 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: Secondary | ICD-10-CM | POA: Diagnosis not present

## 2018-10-18 DIAGNOSIS — R682 Dry mouth, unspecified: Secondary | ICD-10-CM | POA: Diagnosis not present

## 2018-10-18 DIAGNOSIS — R519 Headache, unspecified: Secondary | ICD-10-CM

## 2018-10-18 DIAGNOSIS — R51 Headache: Secondary | ICD-10-CM

## 2018-10-18 LAB — COMPREHENSIVE METABOLIC PANEL
ALT: 12 U/L (ref 0–35)
AST: 21 U/L (ref 0–37)
Albumin: 4.3 g/dL (ref 3.5–5.2)
Alkaline Phosphatase: 57 U/L (ref 39–117)
BUN: 29 mg/dL — ABNORMAL HIGH (ref 6–23)
CO2: 26 mEq/L (ref 19–32)
Calcium: 9.6 mg/dL (ref 8.4–10.5)
Chloride: 103 mEq/L (ref 96–112)
Creatinine, Ser: 1.21 mg/dL — ABNORMAL HIGH (ref 0.40–1.20)
GFR: 45.49 mL/min — ABNORMAL LOW (ref >60.00)
Glucose, Bld: 97 mg/dL (ref 70–99)
POTASSIUM: 4.2 meq/L (ref 3.5–5.1)
Sodium: 137 mEq/L (ref 135–145)
Total Bilirubin: 0.5 mg/dL (ref 0.2–1.2)
Total Protein: 6.6 g/dL (ref 6.0–8.3)

## 2018-10-18 LAB — CBC
HCT: 30.7 % — ABNORMAL LOW (ref 36.0–46.0)
Hemoglobin: 10.4 g/dL — ABNORMAL LOW (ref 12.0–15.0)
MCHC: 33.9 g/dL (ref 30.0–36.0)
MCV: 103.6 fl — ABNORMAL HIGH (ref 78.0–100.0)
Platelets: 213 10*3/uL (ref 150.0–400.0)
RBC: 2.97 Mil/uL — AB (ref 3.87–5.11)
RDW: 14 % (ref 11.5–15.5)
WBC: 7.2 10*3/uL (ref 4.0–10.5)

## 2018-10-18 NOTE — Progress Notes (Signed)
Subjective:   Patient ID: Paula Ward, female    DOB: 10/19/1936, 82 y.o.   MRN: [id number]  HPI The patient is an 82 YO female coming in for ER follow up (in for fall with facial laceration with 10 sutures, got tangled in sheets and fell, denied LOC, no imaging needed then). She is feeling better but still some bruising and needs sutures out. Denies new falls, dizziness, headaches, nausea, vomiting. Having mild tenderness to palpation only. Using nothing for pain. Ice if needed.   Review of Systems  Constitutional: Negative.   HENT: Negative.   Eyes: Negative.   Respiratory: Negative for cough, chest tightness and shortness of breath.   Cardiovascular: Negative for chest pain, palpitations and leg swelling.  Gastrointestinal: Negative for abdominal distention, abdominal pain, constipation, diarrhea, nausea and vomiting.  Musculoskeletal: Negative.   Skin: Negative.   Neurological: Negative.   Psychiatric/Behavioral: Negative.     Objective:  Physical Exam Constitutional:      Appearance: She is well-developed.  HENT:     Head: Normocephalic.     Comments: Bruising left eye with hematoma small above left eye, bruising down cheek, 10 black sutures removed above left eyelid Neck:     Musculoskeletal: Normal range of motion.  Cardiovascular:     Rate and Rhythm: Normal rate and regular rhythm.  Pulmonary:     Effort: Pulmonary effort is normal. No respiratory distress.     Breath sounds: Normal breath sounds. No wheezing or rales.  Abdominal:     General: Bowel sounds are normal. There is no distension.     Palpations: Abdomen is soft.     Tenderness: There is no abdominal tenderness. There is no rebound.  Skin:    General: Skin is warm and dry.  Neurological:     Mental Status: She is alert and oriented to person, place, and time.     Coordination: Coordination normal.     Vitals:   10/18/18 1439  BP: (!) 110/50  Pulse: 80  Temp: 97.9 F (36.6 C)  TempSrc: Oral   SpO2: 94%  Weight: 114 lb (51.7 kg)  Height: 5' 4" (1.626 m)    Assessment & Plan:

## 2018-10-19 DIAGNOSIS — R51 Headache: Principal | ICD-10-CM

## 2018-10-19 DIAGNOSIS — R519 Headache, unspecified: Secondary | ICD-10-CM | POA: Insufficient documentation

## 2018-10-19 MED ORDER — LIDOCAINE VISCOUS HCL 2 % MT SOLN: 5.0000 mL | OROMUCOSAL | 3 refills | Status: DC | PRN

## 2018-10-19 NOTE — Assessment & Plan Note (Signed)
Checking CBC and CMP since here today.

## 2018-10-19 NOTE — Assessment & Plan Note (Signed)
Balance is a problem and she has been working on being very careful and using rails etc when able.

## 2018-10-19 NOTE — Assessment & Plan Note (Signed)
Sent in lidocaine viscous solution to see if this helps with pain with eating.

## 2018-10-19 NOTE — Assessment & Plan Note (Signed)
Stitches removed and all accounted for to match ER note. No evidence of fracture and wound is healing without signs of infection.

## 2018-10-28 LAB — HM MAMMOGRAPHY

## 2018-11-04 ENCOUNTER — Encounter: Payer: Self-pay | Admitting: Internal Medicine

## 2018-11-04 NOTE — Progress Notes (Signed)
Abstracted and sent to scan  

## 2018-11-16 ENCOUNTER — Other Ambulatory Visit (INDEPENDENT_AMBULATORY_CARE_PROVIDER_SITE_OTHER): Payer: Medicare Other

## 2018-11-16 DIAGNOSIS — I272 Pulmonary hypertension, unspecified: Secondary | ICD-10-CM | POA: Diagnosis not present

## 2018-11-16 LAB — CBC
HCT: 32 % — ABNORMAL LOW (ref 36.0–46.0)
Hemoglobin: 10.7 g/dL — ABNORMAL LOW (ref 12.0–15.0)
MCHC: 33.4 g/dL (ref 30.0–36.0)
MCV: 103.9 fl — ABNORMAL HIGH (ref 78.0–100.0)
Platelets: 192 10*3/uL (ref 150.0–400.0)
RBC: 3.08 Mil/uL — ABNORMAL LOW (ref 3.87–5.11)
RDW: 13.3 % (ref 11.5–15.5)
WBC: 6.7 10*3/uL (ref 4.0–10.5)

## 2018-11-16 LAB — COMPREHENSIVE METABOLIC PANEL
ALT: 12 U/L (ref 0–35)
AST: 21 U/L (ref 0–37)
Albumin: 4.3 g/dL (ref 3.5–5.2)
Alkaline Phosphatase: 52 U/L (ref 39–117)
BUN: 26 mg/dL — ABNORMAL HIGH (ref 6–23)
CO2: 28 mEq/L (ref 19–32)
Calcium: 9.5 mg/dL (ref 8.4–10.5)
Chloride: 104 mEq/L (ref 96–112)
Creatinine, Ser: 1.16 mg/dL (ref 0.40–1.20)
GFR: 44.92 mL/min — ABNORMAL LOW (ref >60.00)
Glucose, Bld: 93 mg/dL (ref 70–99)
POTASSIUM: 4.6 meq/L (ref 3.5–5.1)
Sodium: 139 mEq/L (ref 135–145)
Total Bilirubin: 0.5 mg/dL (ref 0.2–1.2)
Total Protein: 6.3 g/dL (ref 6.0–8.3)

## 2018-11-22 ENCOUNTER — Ambulatory Visit: Payer: Medicare Other | Admitting: Internal Medicine

## 2018-11-25 ENCOUNTER — Ambulatory Visit: Payer: Medicare Other | Admitting: Gastroenterology

## 2018-11-26 ENCOUNTER — Ambulatory Visit: Payer: Medicare Other | Admitting: Internal Medicine

## 2018-11-30 ENCOUNTER — Ambulatory Visit: Payer: Medicare Other | Admitting: Internal Medicine

## 2018-12-02 ENCOUNTER — Ambulatory Visit: Payer: Medicare Other | Admitting: Gastroenterology

## 2018-12-02 ENCOUNTER — Encounter: Payer: Self-pay | Admitting: Gastroenterology

## 2018-12-02 VITALS — BP 98/56 | HR 81 | Ht 64.0 in | Wt 111.4 lb

## 2018-12-02 DIAGNOSIS — R634 Abnormal weight loss: Secondary | ICD-10-CM | POA: Diagnosis not present

## 2018-12-02 DIAGNOSIS — K588 Other irritable bowel syndrome: Secondary | ICD-10-CM | POA: Diagnosis not present

## 2018-12-02 DIAGNOSIS — R109 Unspecified abdominal pain: Secondary | ICD-10-CM

## 2018-12-02 DIAGNOSIS — K219 Gastro-esophageal reflux disease without esophagitis: Secondary | ICD-10-CM

## 2018-12-02 NOTE — Progress Notes (Signed)
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  She was hospitalized August 2019 with melena and severe anemia, hemoglobin 5.1 underwent enteroscopy by Dr. Lyndel Safe, single bleeding AVM or Dieulafoy lesion in duodenum was injected and hemostatic clip placement.  Noted additional small nonbleeding AVMs in duodenum.  Hemoglobin improved, stable around 10 most recent labs November 16, 2018  Denies any melena or blood per rectum.  Bowel habits are regular, less formed recently.  She has decreased appetite, is eating only 1 or 2 meals a day.  She gets significant shortness of breath if she eats a large meal.  She recently had  episodes of vomiting a few times in the past few weeks.  Denies any sick contacts or change in diet. Usually throws up stringy white mucus, worse in the morning.  Is using Flonase and humidifier with some improvement.  Is taking omeprazole 40 mg daily.  Denies any dysphagia, odynophagia, regurgitation or nausea.   Outpatient Encounter Medications as of 12/02/2018  Medication Sig  . acetaminophen (TYLENOL) 325 MG tablet Take 650 mg by mouth every 6 (six) hours as needed for mild pain.   Marland Kitchen albuterol (PROVENTIL HFA;VENTOLIN HFA) 108 (90 Base) MCG/ACT inhaler INHALE 2 PUFFS INTO THE LUNGS EVERY 6 HOURS AS NEEDED FOR WHEEZING OR SHORTNESS OF BREATH  . ALPRAZolam (XANAX) 0.25 MG tablet Take 0.5 tablets (0.125 mg total) by mouth at bedtime as needed.  . bacitracin ointment Apply 1 application topically 2 (two) times daily.  .  Cholecalciferol (VITAMIN D-1000 MAX ST) 1000 units tablet Take 3,000 Units by mouth daily.  Marland Kitchen diltiazem (CARDIZEM CD) 120 MG 24 hr capsule Take 120 mg by mouth daily.    . fluticasone (FLONASE) 50 MCG/ACT nasal spray instill 1 spray into each nostril twice a day if needed  . lidocaine (XYLOCAINE) 2 % solution Use as directed 5 mLs in the mouth or throat every 3 (three) hours as needed for mouth pain.  . meclizine (ANTIVERT) 12.5 MG tablet Take 1-2 tablets (12.5-25 mg total) by mouth 3 (three) times daily as needed for dizziness.  . montelukast (SINGULAIR) 10 MG tablet TAKE 1 TABLET(10 MG) BY MOUTH AT BEDTIME  . Mouthwashes (DRY MOUTH MOUTHWASH) LIQD Use as directed 5 mLs in the mouth or throat as needed (dry mouth).  . Multiple Vitamin (MULTIVITAMIN WITH MINERALS) TABS tablet Take 1 tablet by mouth daily. NO IRON  . mupirocin cream (BACTROBAN) 2 % Apply 1 application topically 2 (two) times daily.  Marland Kitchen omeprazole (PRILOSEC) 40 MG capsule TAKE 1 CAPSULE BY MOUTH TWICE DAILY  . OPSUMIT 10 MG TABS Take 1 tablet by mouth daily.   . pilocarpine (SALAGEN) 5 MG tablet Take 1 tablet (5 mg total) by mouth 3 (three) times daily as needed.  . [DISCONTINUED] acetaminophen (TYLENOL) 500 MG tablet Take 2 tablets (1,000 mg total) by mouth every 6 (six) hours as needed.   No facility-administered encounter medications on file as of 12/02/2018.     Allergies as of 12/02/2018 - Review Complete 12/02/2018  Allergen Reaction Noted  . Codeine  Nausea Only 08/03/2008  . Other Nausea And Vomiting 04/05/2012  . Erythromycin Nausea And Vomiting 10/17/2014  . Lisinopril  12/14/2014    Past Medical History:  Diagnosis Date  . Anemia   . Angiodysplasia of stomach and duodenum   . Arthus phenomenon   . AVM (arteriovenous malformation)   . Blood transfusion without reported diagnosis    last 4 units 12-22-15, Iron infusion x2 last -01-07-16,01-14-16.  . Breast cancer ([place]) 1989   Left  . Candida esophagitis ([place])   .  Cataract   . Chronic kidney disease    Chronic mild renal insuffiency  . CREST syndrome ([place])   . GERD (gastroesophageal reflux disease)    w/ HH  . Interstitial lung disease ([place])   . Nodule of kidney   . PONV (postoperative nausea and vomiting)   . Pulmonary embolus ([place]) 2003  . Pulmonary hypertension ([place])    followed by Dr Gaynell Face at [facility], now Dr. Maryjean Ka visit end 2'17.  . Rectal incontinence   . Renal cell carcinoma ([place])   . Scleroderma ([place])   . Tubular adenoma of colon   . Uterine polyp     Past Surgical History:  Procedure Laterality Date  . APPENDECTOMY  1962  . BREAST LUMPECTOMY  1989   left  . CATARACT EXTRACTION, BILATERAL Bilateral 12/2013  . ENTEROSCOPY N/A 01/18/2016   Procedure: ENTEROSCOPY;  Surgeon: Mauri Pole, MD;  Location: [facility] ENDOSCOPY;  Service: Endoscopy;  Laterality: N/A;  . ENTEROSCOPY N/A 05/24/2018   Procedure: ENTEROSCOPY;  Surgeon: Jackquline Denmark, MD;  Location: [facility] ENDOSCOPY;  Service: Endoscopy;  Laterality: N/A;  . ESOPHAGOGASTRODUODENOSCOPY (EGD) WITH PROPOFOL N/A 12/21/2015   Procedure: ESOPHAGOGASTRODUODENOSCOPY (EGD) WITH PROPOFOL;  Surgeon: Irene Shipper, MD;  Location: [facility] ENDOSCOPY;  Service: Endoscopy;  Laterality: N/A;  . HOT HEMOSTASIS N/A 05/24/2018   Procedure: HOT HEMOSTASIS (ARGON PLASMA COAGULATION/BICAP);  Surgeon: Jackquline Denmark, MD;  Location: [facility];  Service: Endoscopy;  Laterality: N/A;  . IR GENERIC HISTORICAL  06/05/2016   IR RADIOLOGIST EVAL & MGMT 06/05/2016 Aletta Edouard, MD [facility] INTERV RAD  . IVC Filter    . KIDNEY SURGERY     left -"laser surgery by Dr. Kathlene Cote- 5 yrs ago" no removal  . SCHLEROTHERAPY  05/24/2018   Procedure: Woodward Ku;  Surgeon: Jackquline Denmark, MD;  Location: [facility] ENDOSCOPY;  Service: Endoscopy;;  . TONSILLECTOMY    . TUBAL LIGATION      Family History  Problem Relation Age of Onset  . Bladder Cancer Father   . Diabetes Father   . Prostate cancer Father   . Alzheimer's disease  Mother   . Diabetes Sister   . Lung cancer Sister         smoker  . Esophageal cancer Paternal Uncle   . Colon cancer Neg Hx     Social History   Socioeconomic History  . Marital status: Married    Spouse name: Not on file  . Number of children: 2  . Years of education: Not on file  . Highest education level: Not on file  Occupational History  . Occupation: Retired  Scientific laboratory technician  . Financial resource strain: Not hard at all  . Food insecurity:    Worry: Never true    Inability: Never true  . Transportation needs:    Medical: No    Non-medical: No  Tobacco Use  . Smoking status: Never Smoker  . Smokeless tobacco: Never Used  Substance and Sexual Activity  .  Alcohol use: No  . Drug use: No  . Sexual activity: Never  Lifestyle  . Physical activity:    Days per week: 2 days    Minutes per session: 40 min  . Stress: Only a little  Relationships  . Social connections:    Talks on phone: More than three times a week    Gets together: More than three times a week    Attends religious service: More than 4 times per year    Active member of club or organization: Yes    Attends meetings of clubs or organizations: More than 4 times per year    Relationship status: Married  . Intimate partner violence:    Fear of current or ex partner: No    Emotionally abused: No    Physically abused: No    Forced sexual activity: No  Other Topics Concern  . Not on file  Social History Narrative   Married '61   1 son- '65, 1 daughter '63; 6 children (2 adopted)   SO- SOB   Retirement- doing well   Marriage in good health   Patient has never smoked   Alcohol use- no   Pt gets regular exercise            Review of systems: Review of Systems  Constitutional: Negative for fever and chills.  HENT: Positive for sinus problem  Eyes: Negative for blurred vision.  Respiratory: Positive for cough, shortness of breath and wheezing.   Cardiovascular: Negative for chest pain and  palpitations.  Gastrointestinal: as per HPI Genitourinary: Negative for dysuria, urgency, frequency and hematuria.  Musculoskeletal: Negative for myalgias, back pain and joint pain.  Skin: Negative for itching and rash.  Neurological: Negative for dizziness, tremors, focal weakness, seizures and loss of consciousness.  Endo/Heme/Allergies: Positive for seasonal allergies.  Psychiatric/Behavioral: Negative for depression, suicidal ideas and hallucinations.  All other systems reviewed and are negative.   Physical Exam: Vitals:   12/02/18 1005  BP: (!) 98/56  Pulse: 81   Body mass index is 19.12 kg/m. Gen:      No acute distress HEENT:  EOMI, sclera anicteric Neck:     No masses; no thyromegaly Lungs:    Clear to auscultation bilaterally; normal respiratory effort CV:         Regular rate and rhythm; + murmurs Abd:      + bowel sounds; soft, non-tender; no palpable masses, no distension Ext:    No edema; adequate peripheral perfusion Skin:      Warm and dry; no rash Neuro: alert and oriented x 3 Psych: normal mood and affect  Data Reviewed:  Reviewed labs, radiology imaging, old records and pertinent past GI work up   Assessment and Plan/Recommendations:  83 year old female with history of scleroderma, pulmonary hypertension, gastrointestinal AVMs with recent hospitalization in August 2019 with melena, acute GI blood loss from duodenal AVM status post hemostatic clip placement.  GERD: Continue Protonix 40 mg daily Discussed antireflux measures  Progressive weight loss Discussed increasing calorie intake with high-protein diet and small frequent meals  Chronic anemia with GI blood loss secondary to AVMs Hemoglobin stable with no overt GI bleeding  Lower abdominal cramping/bloating Do a trial of IBgard 1 capsule up to 3 times daily for possible IBS  Return in 1 month or sooner if needed  25 minutes was spent face-to-face with the patient. Greater than 50% of the time  used for counseling as well as treatment plan and follow-up. She had  multiple questions which were answered to her satisfaction  K. Denzil Magnuson , MD [phone]    CC: Hoyt Koch, *

## 2018-12-02 NOTE — Patient Instructions (Signed)
Eat small frequent meals  Eat high protein/high calorie diet  Take IBGard 1 capsule three times a day  Continue PPI    Gastroesophageal Reflux Disease, Adult Gastroesophageal reflux (GER) happens when acid from the stomach flows up into the tube that connects the mouth and the stomach (esophagus). Normally, food travels down the esophagus and stays in the stomach to be digested. However, when a person has GER, food and stomach acid sometimes move back up into the esophagus. If this becomes a more serious problem, the person may be diagnosed with a disease called gastroesophageal reflux disease (GERD). GERD occurs when the reflux:  Happens often.  Causes frequent or severe symptoms.  Causes problems such as damage to the esophagus. When stomach acid comes in contact with the esophagus, the acid may cause soreness (inflammation) in the esophagus. Over time, GERD may create small holes (ulcers) in the lining of the esophagus. What are the causes? This condition is caused by a problem with the muscle between the esophagus and the stomach (lower esophageal sphincter, or LES). Normally, the LES muscle closes after food passes through the esophagus to the stomach. When the LES is weakened or abnormal, it does not close properly, and that allows food and stomach acid to go back up into the esophagus. The LES can be weakened by certain dietary substances, medicines, and medical conditions, including:  Tobacco use.  Pregnancy.  Having a hiatal hernia.  Alcohol use.  Certain foods and beverages, such as coffee, chocolate, onions, and peppermint. What increases the risk? You are more likely to develop this condition if you:  Have an increased body weight.  Have a connective tissue disorder.  Use NSAID medicines. What are the signs or symptoms? Symptoms of this condition include:  Heartburn.  Difficult or painful swallowing.  The feeling of having a lump in the throat.  Abitter  taste in the mouth.  Bad breath.  Having a large amount of saliva.  Having an upset or bloated stomach.  Belching.  Chest pain. Different conditions can cause chest pain. Make sure you see your health care provider if you experience chest pain.  Shortness of breath or wheezing.  Ongoing (chronic) cough or a night-time cough.  Wearing away of tooth enamel.  Weight loss. How is this diagnosed? Your health care provider will take a medical history and perform a physical exam. To determine if you have mild or severe GERD, your health care provider may also monitor how you respond to treatment. You may also have tests, including:  A test to examine your stomach and esophagus with a small camera (endoscopy).  A test thatmeasures the acidity level in your esophagus.  A test thatmeasures how much pressure is on your esophagus.  A barium swallow or modified barium swallow test to show the shape, size, and functioning of your esophagus. How is this treated? The goal of treatment is to help relieve your symptoms and to prevent complications. Treatment for this condition may vary depending on how severe your symptoms are. Your health care provider may recommend:  Changes to your diet.  Medicine.  Surgery. Follow these instructions at home: Eating and drinking   Follow a diet as recommended by your health care provider. This may involve avoiding foods and drinks such as: ? Coffee and tea (with or without caffeine). ? Drinks that containalcohol. ? Energy drinks and sports drinks. ? Carbonated drinks or sodas. ? Chocolate and cocoa. ? Peppermint and mint flavorings. ? Garlic and onions. ?  Horseradish. ? Spicy and acidic foods, including peppers, chili powder, curry powder, vinegar, hot sauces, and barbecue sauce. ? Citrus fruit juices and citrus fruits, such as oranges, lemons, and limes. ? Tomato-based foods, such as red sauce, chili, salsa, and pizza with red sauce. ? Fried  and fatty foods, such as donuts, french fries, potato chips, and high-fat dressings. ? High-fat meats, such as hot dogs and fatty cuts of red and white meats, such as rib eye steak, sausage, ham, and bacon. ? High-fat dairy items, such as whole milk, butter, and cream cheese.  Eat small, frequent meals instead of large meals.  Avoid drinking large amounts of liquid with your meals.  Avoid eating meals during the 2-3 hours before bedtime.  Avoid lying down right after you eat.  Do not exercise right after you eat. Lifestyle   Do not use any products that contain nicotine or tobacco, such as cigarettes, e-cigarettes, and chewing tobacco. If you need help quitting, ask your health care provider.  Try to reduce your stress by using methods such as yoga or meditation. If you need help reducing stress, ask your health care provider.  If you are overweight, reduce your weight to an amount that is healthy for you. Ask your health care provider for guidance about a safe weight loss goal. General instructions  Pay attention to any changes in your symptoms.  Take over-the-counter and prescription medicines only as told by your health care provider. Do not take aspirin, ibuprofen, or other NSAIDs unless your health care provider told you to do so.  Wear loose-fitting clothing. Do not wear anything tight around your waist that causes pressure on your abdomen.  Raise (elevate) the head of your bed about 6 inches (15 cm).  Avoid bending over if this makes your symptoms worse.  Keep all follow-up visits as told by your health care provider. This is important. Contact a health care provider if:  You have: ? New symptoms. ? Unexplained weight loss. ? Difficulty swallowing or it hurts to swallow. ? Wheezing or a persistent cough. ? A hoarse voice.  Your symptoms do not improve with treatment. Get help right away if you:  Have pain in your arms, neck, jaw, teeth, or back.  Feel sweaty,  dizzy, or light-headed.  Have chest pain or shortness of breath.  Vomit and your vomit looks like blood or coffee grounds.  Faint.  Have stool that is bloody or black.  Cannot swallow, drink, or eat. Summary  Gastroesophageal reflux happens when acid from the stomach flows up into the esophagus. GERD is a disease in which the reflux happens often, causes frequent or severe symptoms, or causes problems such as damage to the esophagus.  Treatment for this condition may vary depending on how severe your symptoms are. Your health care provider may recommend diet and lifestyle changes, medicine, or surgery.  Contact a health care provider if you have new or worsening symptoms.  Take over-the-counter and prescription medicines only as told by your health care provider. Do not take aspirin, ibuprofen, or other NSAIDs unless your health care provider told you to do so.  Keep all follow-up visits as told by your health care provider. This is important. This information is not intended to replace advice given to you by your health care provider. Make sure you discuss any questions you have with your health care provider. Document Released: 07/16/2005 Document Revised: 04/14/2018 Document Reviewed: 04/14/2018 Elsevier Interactive Patient Education  2019 [employer].  Food Choices for Gastroesophageal Reflux Disease, Adult When you have gastroesophageal reflux disease (GERD), the foods you eat and your eating habits are very important. Choosing the right foods can help ease your discomfort. Think about working with a nutrition specialist (dietitian) to help you make good choices. What are tips for following this plan?  Meals  Choose healthy foods that are low in fat, such as fruits, vegetables, whole grains, low-fat dairy products, and lean meat, fish, and poultry.  Eat small meals often instead of 3 large meals a day. Eat your meals slowly, and in a place where you are relaxed. Avoid  bending over or lying down until 2-3 hours after eating.  Avoid eating meals 2-3 hours before bed.  Avoid drinking a lot of liquid with meals.  Cook foods using methods other than frying. Bake, grill, or broil food instead.  Avoid or limit: ? Chocolate. ? Peppermint or spearmint. ? Alcohol. ? Pepper. ? Black and decaffeinated coffee. ? Black and decaffeinated tea. ? Bubbly (carbonated) soft drinks. ? Caffeinated energy drinks and soft drinks.  Limit high-fat foods such as: ? Fatty meat or fried foods. ? Whole milk, cream, butter, or ice cream. ? Nuts and nut butters. ? Pastries, donuts, and sweets made with butter or shortening.  Avoid foods that cause symptoms. These foods may be different for everyone. Common foods that cause symptoms include: ? Tomatoes. ? Oranges, lemons, and limes. ? Peppers. ? Spicy food. ? Onions and garlic. ? Vinegar. Lifestyle  Maintain a healthy weight. Ask your doctor what weight is healthy for you. If you need to lose weight, work with your doctor to do so safely.  Exercise for at least 30 minutes for 5 or more days each week, or as told by your doctor.  Wear loose-fitting clothes.  Do not smoke. If you need help quitting, ask your doctor.  Sleep with the head of your bed higher than your feet. Use a wedge under the mattress or blocks under the bed frame to raise the head of the bed. Summary  When you have gastroesophageal reflux disease (GERD), food and lifestyle choices are very important in easing your symptoms.  Eat small meals often instead of 3 large meals a day. Eat your meals slowly, and in a place where you are relaxed.  Limit high-fat foods such as fatty meat or fried foods.  Avoid bending over or lying down until 2-3 hours after eating.  Avoid peppermint and spearmint, caffeine, alcohol, and chocolate. This information is not intended to replace advice given to you by your health care provider. Make sure you discuss any  questions you have with your health care provider. Document Released: 04/06/2012 Document Revised: 11/11/2016 Document Reviewed: 11/11/2016 Elsevier Interactive Patient Education  2019 [employer].

## 2018-12-03 IMAGING — DX DG CHEST 2V
2 series · 2 of 2 positions shown · non-contrast
Comparison: Chest radiograph 05/23/2018

CLINICAL DATA: Patient with shortness of breath.

EXAM:
CHEST - 2 VIEW

[chest pa]
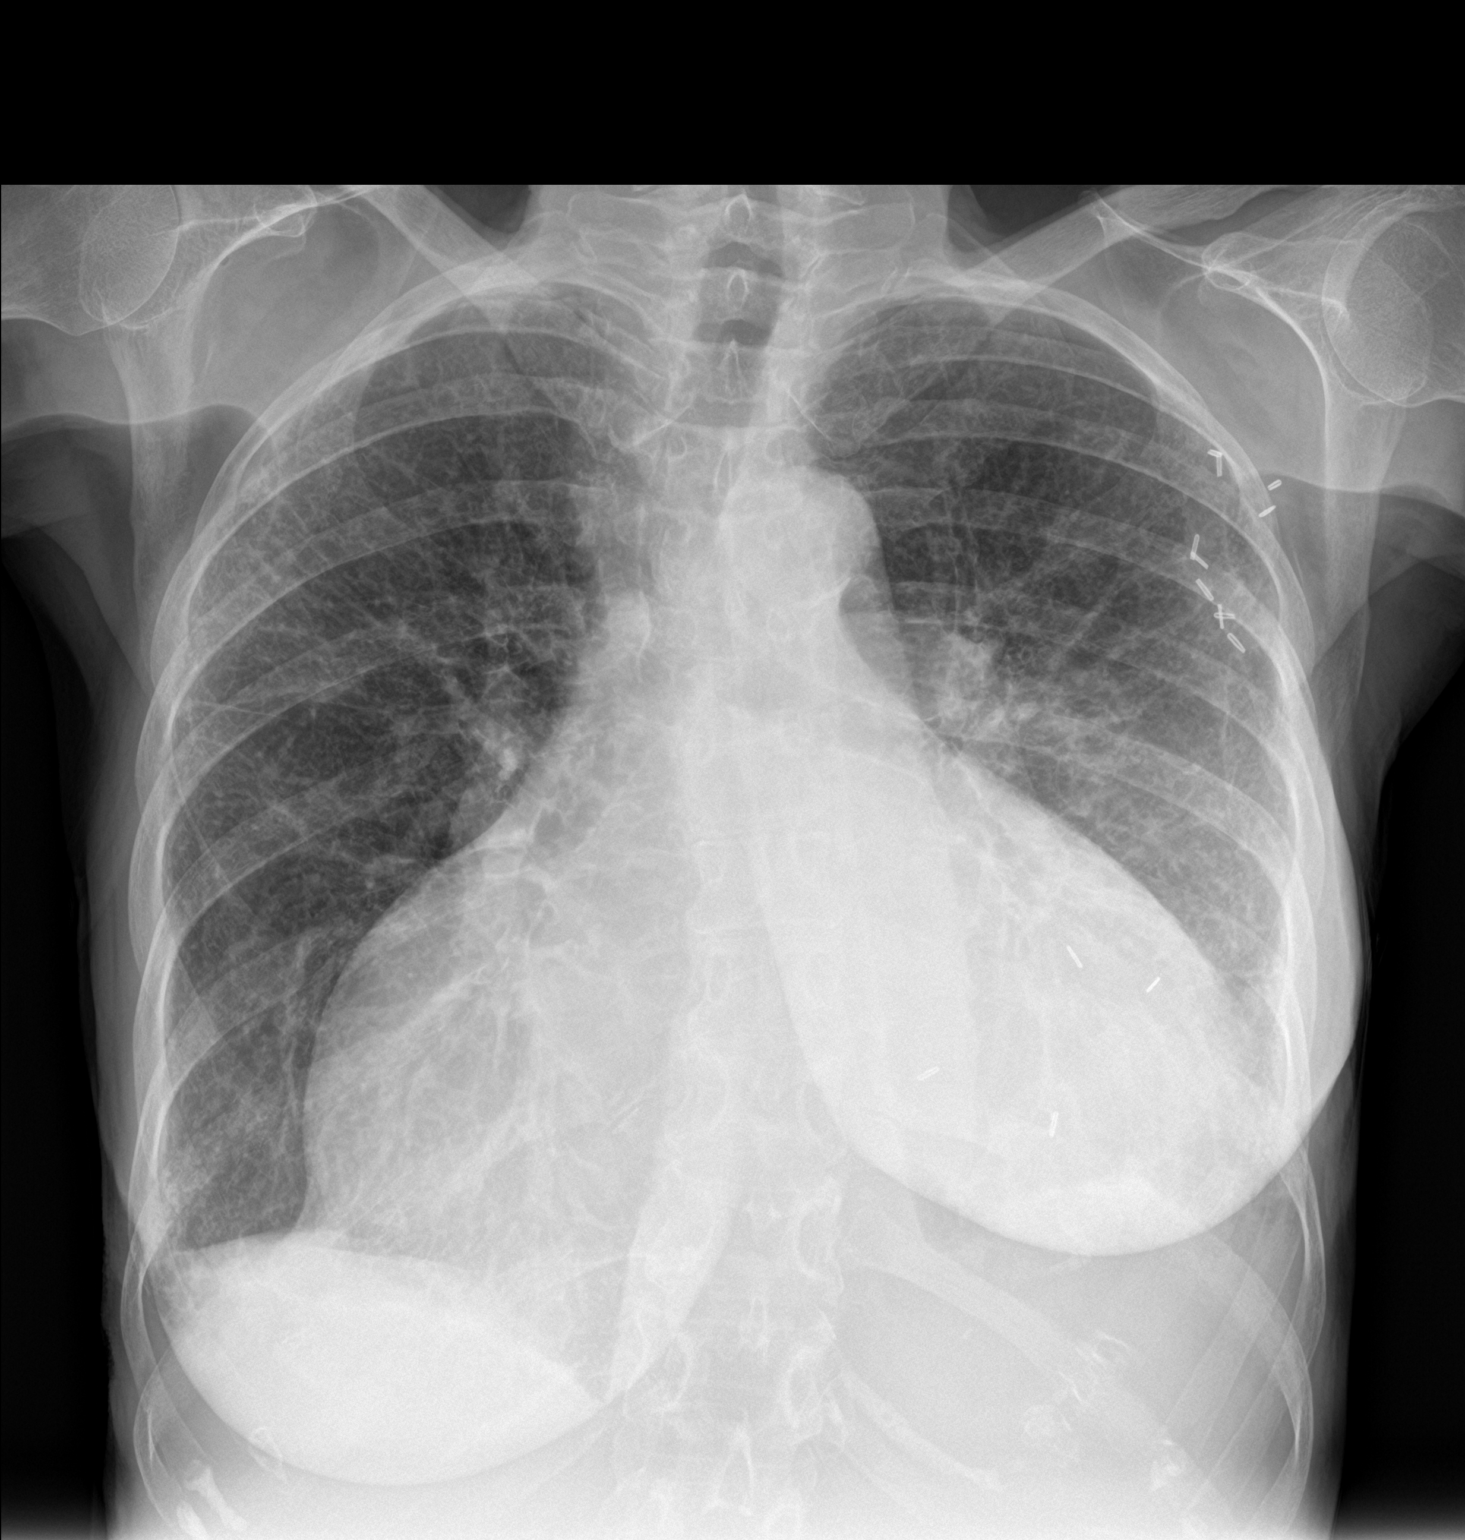

[chest lat]
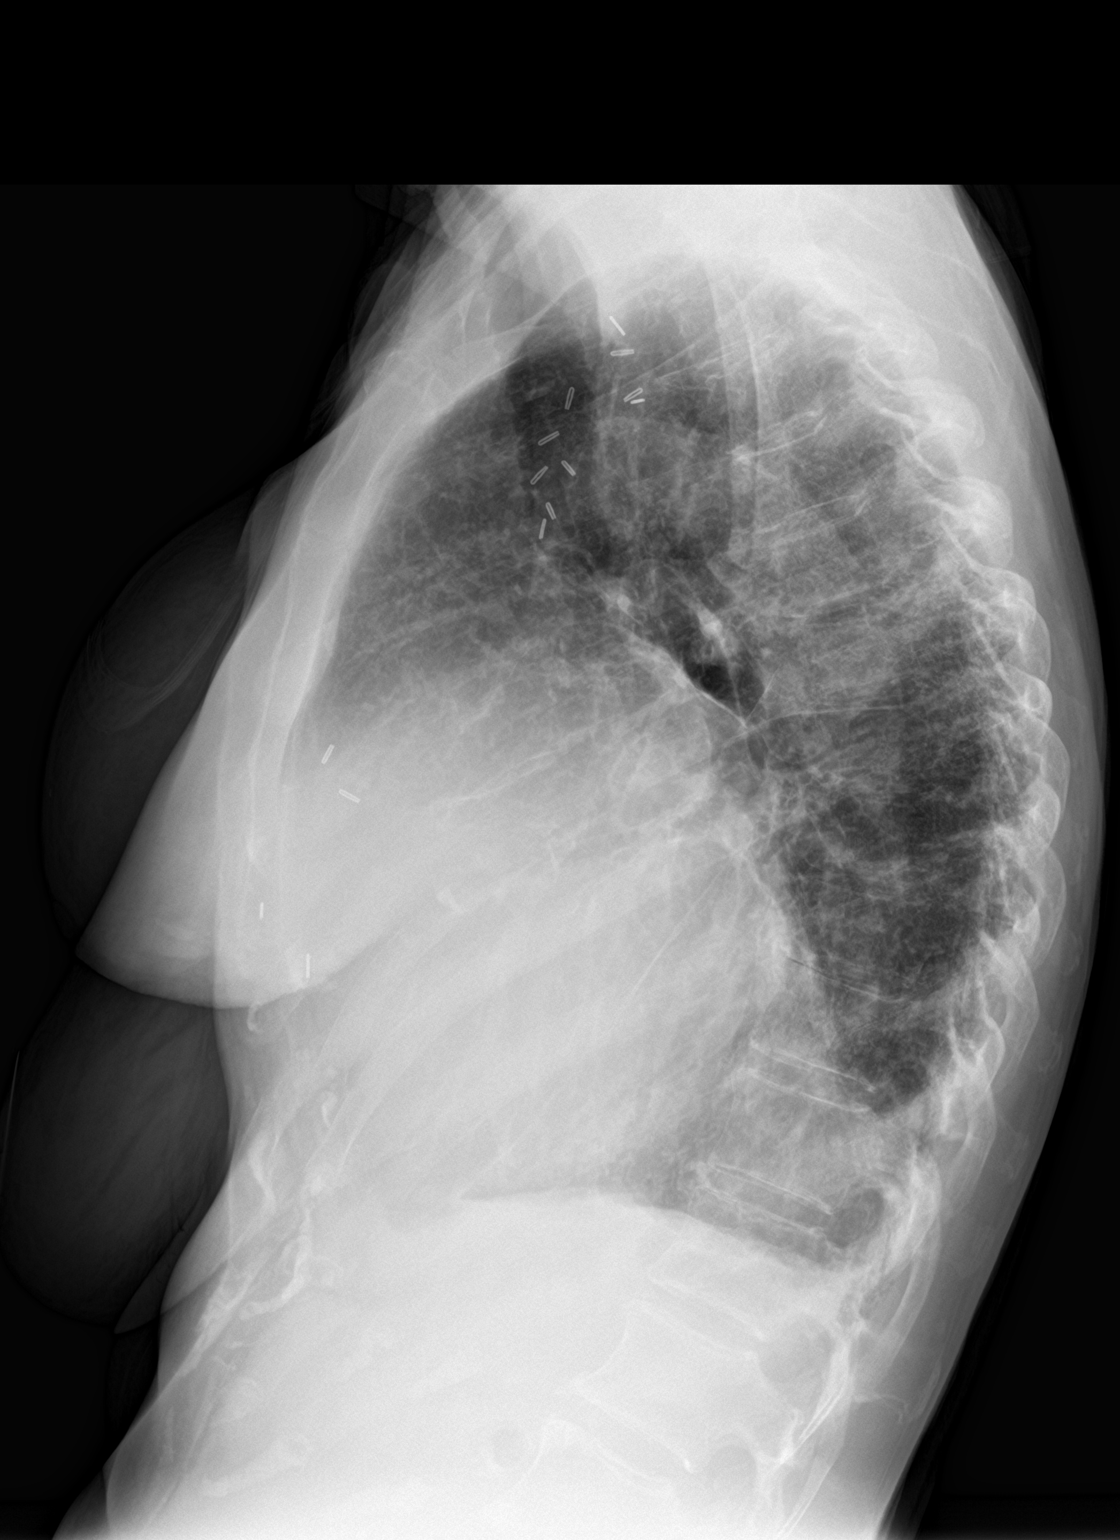

[2 of 2 positions shown; findings below may reference images not displayed]

FINDINGS: Stable marked cardiomegaly. Similar-appearing coarse interstitial
opacities. Stable probable left basilar scarring/atelectasis.
Elevation left hemidiaphragm. Left axillary surgical change. No
pneumothorax.
IMPRESSION: Cardiomegaly.

Similar-appearing coarse interstitial opacities.

## 2018-12-05 ENCOUNTER — Other Ambulatory Visit: Payer: Self-pay | Admitting: Internal Medicine

## 2018-12-17 ENCOUNTER — Encounter: Payer: Self-pay | Admitting: Internal Medicine

## 2018-12-17 ENCOUNTER — Telehealth: Payer: Self-pay

## 2018-12-17 ENCOUNTER — Ambulatory Visit: Payer: Medicare Other | Admitting: Internal Medicine

## 2018-12-17 ENCOUNTER — Other Ambulatory Visit (INDEPENDENT_AMBULATORY_CARE_PROVIDER_SITE_OTHER): Payer: Medicare Other

## 2018-12-17 VITALS — BP 140/50 | HR 76 | Temp 97.7°F | Ht 64.0 in | Wt 114.0 lb

## 2018-12-17 DIAGNOSIS — N183 Chronic kidney disease, stage 3 unspecified: Secondary | ICD-10-CM

## 2018-12-17 DIAGNOSIS — D500 Iron deficiency anemia secondary to blood loss (chronic): Secondary | ICD-10-CM

## 2018-12-17 DIAGNOSIS — I272 Pulmonary hypertension, unspecified: Secondary | ICD-10-CM | POA: Diagnosis not present

## 2018-12-17 DIAGNOSIS — R5383 Other fatigue: Secondary | ICD-10-CM | POA: Diagnosis not present

## 2018-12-17 DIAGNOSIS — E559 Vitamin D deficiency, unspecified: Secondary | ICD-10-CM

## 2018-12-17 DIAGNOSIS — E538 Deficiency of other specified B group vitamins: Secondary | ICD-10-CM

## 2018-12-17 DIAGNOSIS — R400 Somnolence: Secondary | ICD-10-CM | POA: Diagnosis not present

## 2018-12-17 LAB — CBC
HCT: 30.5 % — ABNORMAL LOW (ref 36.0–46.0)
Hemoglobin: 10.3 g/dL — ABNORMAL LOW (ref 12.0–15.0)
MCHC: 33.8 g/dL (ref 30.0–36.0)
MCV: 103.3 fl — ABNORMAL HIGH (ref 78.0–100.0)
Platelets: 189 10*3/uL (ref 150.0–400.0)
RBC: 2.95 Mil/uL — ABNORMAL LOW (ref 3.87–5.11)
RDW: 13.5 % (ref 11.5–15.5)
WBC: 6.1 10*3/uL (ref 4.0–10.5)

## 2018-12-17 LAB — COMPREHENSIVE METABOLIC PANEL
ALT: 11 U/L (ref 0–35)
AST: 21 U/L (ref 0–37)
Albumin: 4.3 g/dL (ref 3.5–5.2)
Alkaline Phosphatase: 59 U/L (ref 39–117)
BUN: 26 mg/dL — ABNORMAL HIGH (ref 6–23)
CO2: 26 mEq/L (ref 19–32)
Calcium: 9.3 mg/dL (ref 8.4–10.5)
Chloride: 101 mEq/L (ref 96–112)
Creatinine, Ser: 1.02 mg/dL (ref 0.40–1.20)
GFR: 52.1 mL/min — ABNORMAL LOW (ref >60.00)
GLUCOSE: 98 mg/dL (ref 70–99)
Potassium: 4.5 mEq/L (ref 3.5–5.1)
Sodium: 135 mEq/L (ref 135–145)
Total Bilirubin: 0.4 mg/dL (ref 0.2–1.2)
Total Protein: 6.5 g/dL (ref 6.0–8.3)

## 2018-12-17 LAB — VITAMIN B12: Vitamin B-12: 882 pg/mL (ref 211–911)

## 2018-12-17 LAB — TSH: TSH: 4.49 u[IU]/mL (ref 0.35–4.50)

## 2018-12-17 LAB — T4, FREE: Free T4: 0.81 ng/dL (ref 0.60–1.60)

## 2018-12-17 LAB — VITAMIN D 25 HYDROXY (VIT D DEFICIENCY, FRACTURES): VITD: >120 ng/mL

## 2018-12-17 NOTE — Telephone Encounter (Signed)
CRITICAL VALUE STICKER  CRITICAL VALUE: Vit D greater than 120  RECEIVER (on-site recipient of call): Briana   DATE & TIME NOTIFIED: 4:38  MESSENGER (representative from lab): Hope  MD NOTIFIED: yes  TIME OF NOTIFICATION: 4:39  RESPONSE:

## 2018-12-17 NOTE — Assessment & Plan Note (Signed)
Checking CBC and adjust as needed.

## 2018-12-17 NOTE — Assessment & Plan Note (Addendum)
Checking CMP for stability given medication regimen needs close monthly monitoring.

## 2018-12-17 NOTE — Assessment & Plan Note (Signed)
Could be concussion related as she did have concussion after the fall in December. We talked about how she may just need naps currently. Due for CBC and CMP today and checking TSH and vitamin levels also.

## 2018-12-17 NOTE — Progress Notes (Signed)
   Subjective:   Patient ID: Paula Ward, female    DOB: 07-11-1936, 83 y.o.   MRN: [id number]  HPI The patient is an 83 YO female coming in for falling asleep easily. Since her head trauma and fall in Dec 2019 she has been sleeping more during the day. She will sleep normal amount at night time. Gets up once to urinate and falls right back asleep. Is at work and can fall asleep while people are talking to her. She is easily awakened and responds appropriately without confusion when awaking. If not with people she will sleep for 15-30 minutes. She normally will do this 1-2 times per day. Unsure if this was more often right after the fall. Denies headaches, nausea, vomiting. Denies confusion. Denies episodes of staring into space or being unable to move or tremors or seizures.   PMH, [facility], social history reviewed and updated.   Review of Systems  Constitutional: Negative.   HENT: Negative.   Eyes: Negative.   Respiratory: Negative for cough, chest tightness and shortness of breath.   Cardiovascular: Negative for chest pain, palpitations and leg swelling.  Gastrointestinal: Negative for abdominal distention, abdominal pain, constipation, diarrhea, nausea and vomiting.  Musculoskeletal: Negative.   Skin: Negative.   Neurological: Negative.   Psychiatric/Behavioral: Positive for sleep disturbance.    Objective:  Physical Exam Constitutional:      Appearance: She is well-developed.  HENT:     Head: Normocephalic and atraumatic.  Neck:     Musculoskeletal: Normal range of motion.  Cardiovascular:     Rate and Rhythm: Normal rate and regular rhythm.     Heart sounds: Murmur present.  Pulmonary:     Effort: Pulmonary effort is normal. No respiratory distress.     Breath sounds: Normal breath sounds. No wheezing or rales.  Abdominal:     General: Bowel sounds are normal. There is no distension.     Palpations: Abdomen is soft.     Tenderness: There is no abdominal tenderness. There is  no rebound.  Skin:    General: Skin is warm and dry.  Neurological:     Mental Status: She is alert and oriented to person, place, and time.     Coordination: Coordination normal.     Vitals:   12/17/18 1417  BP: (!) 140/50  Pulse: 76  Temp: 97.7 F (36.5 C)  TempSrc: Oral  SpO2: 95%  Weight: 114 lb (51.7 kg)  Height: _0  (1.626 m)    Assessment & Plan:

## 2018-12-17 NOTE — Patient Instructions (Signed)
We will check the labs today.

## 2018-12-24 IMAGING — DX DG CHEST 2V
2 series · 2 of 2 positions shown · non-contrast
Comparison: Chest x-ray of 06/24/2018 and 05/23/2018

CLINICAL DATA: Cough, congestion, shortness of breath, chronic lung
disease, history of breast carcinoma

EXAM:
CHEST - 2 VIEW

[chest pa]
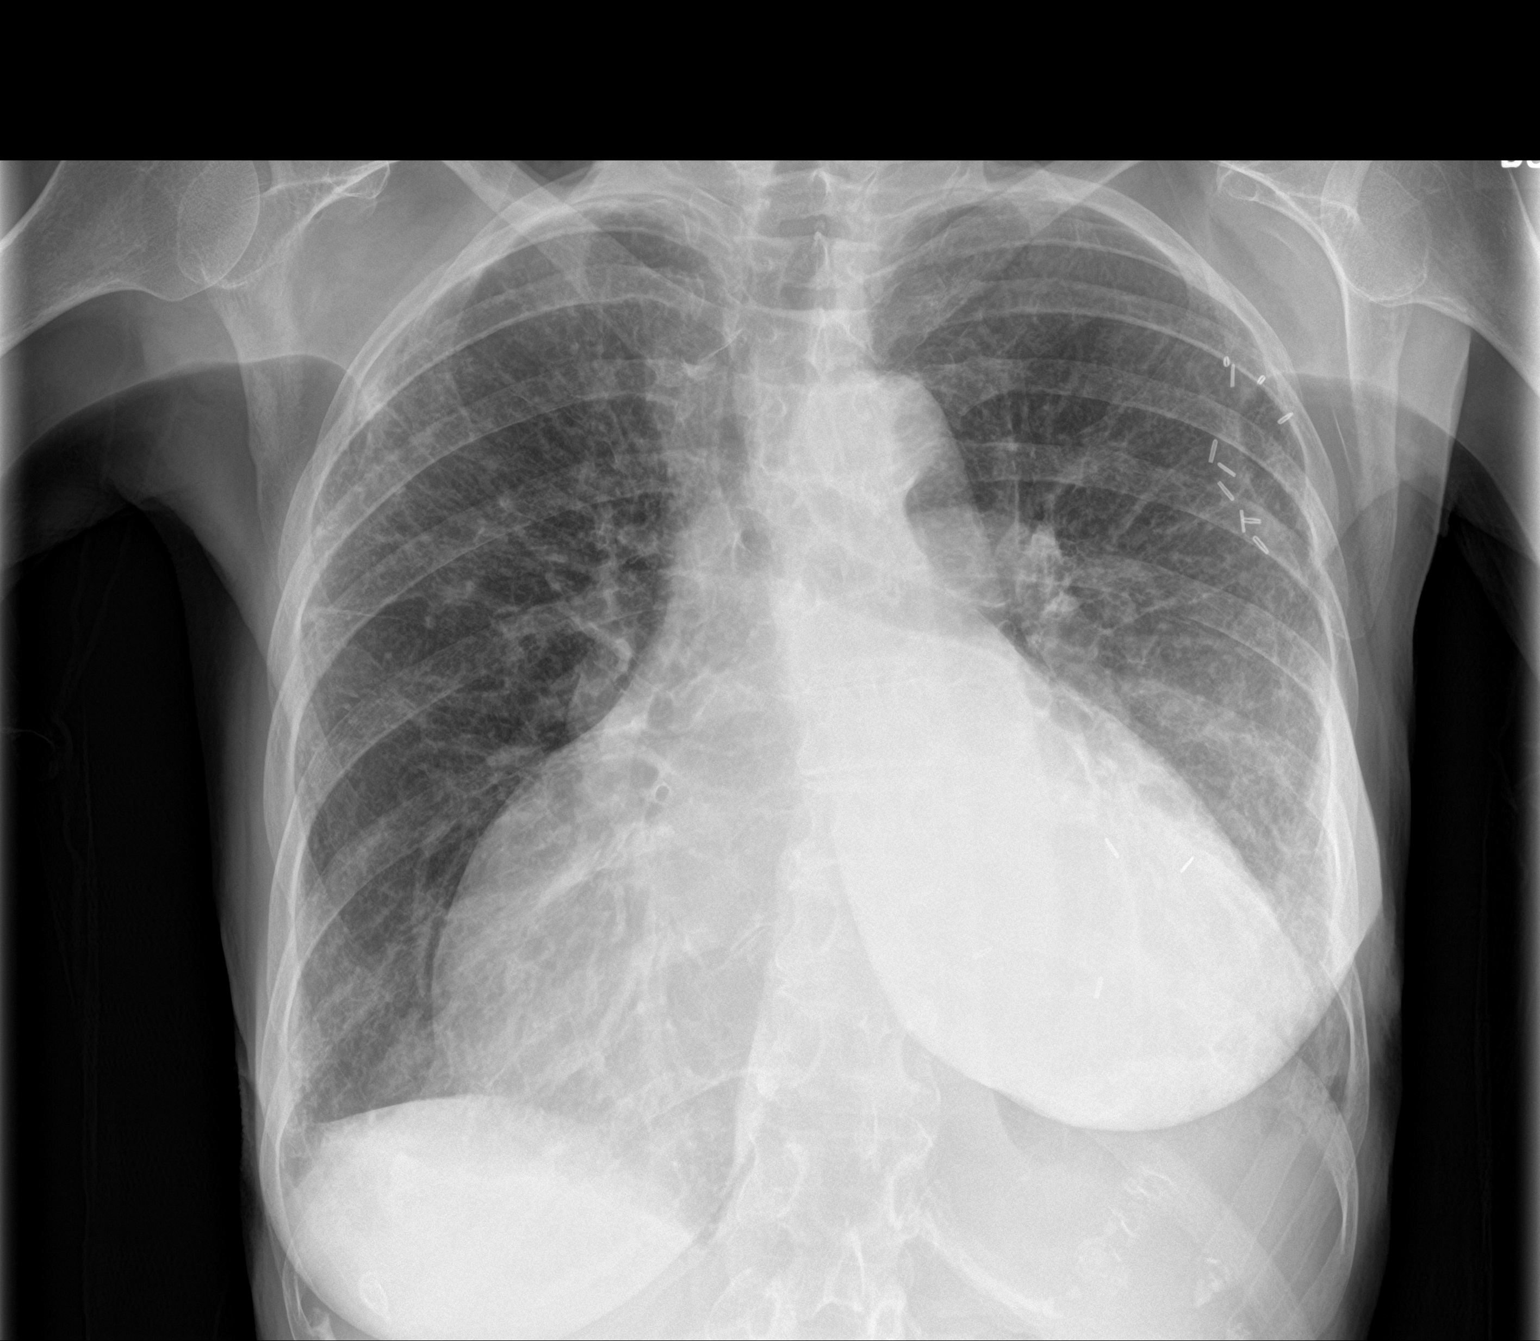

[chest lat]
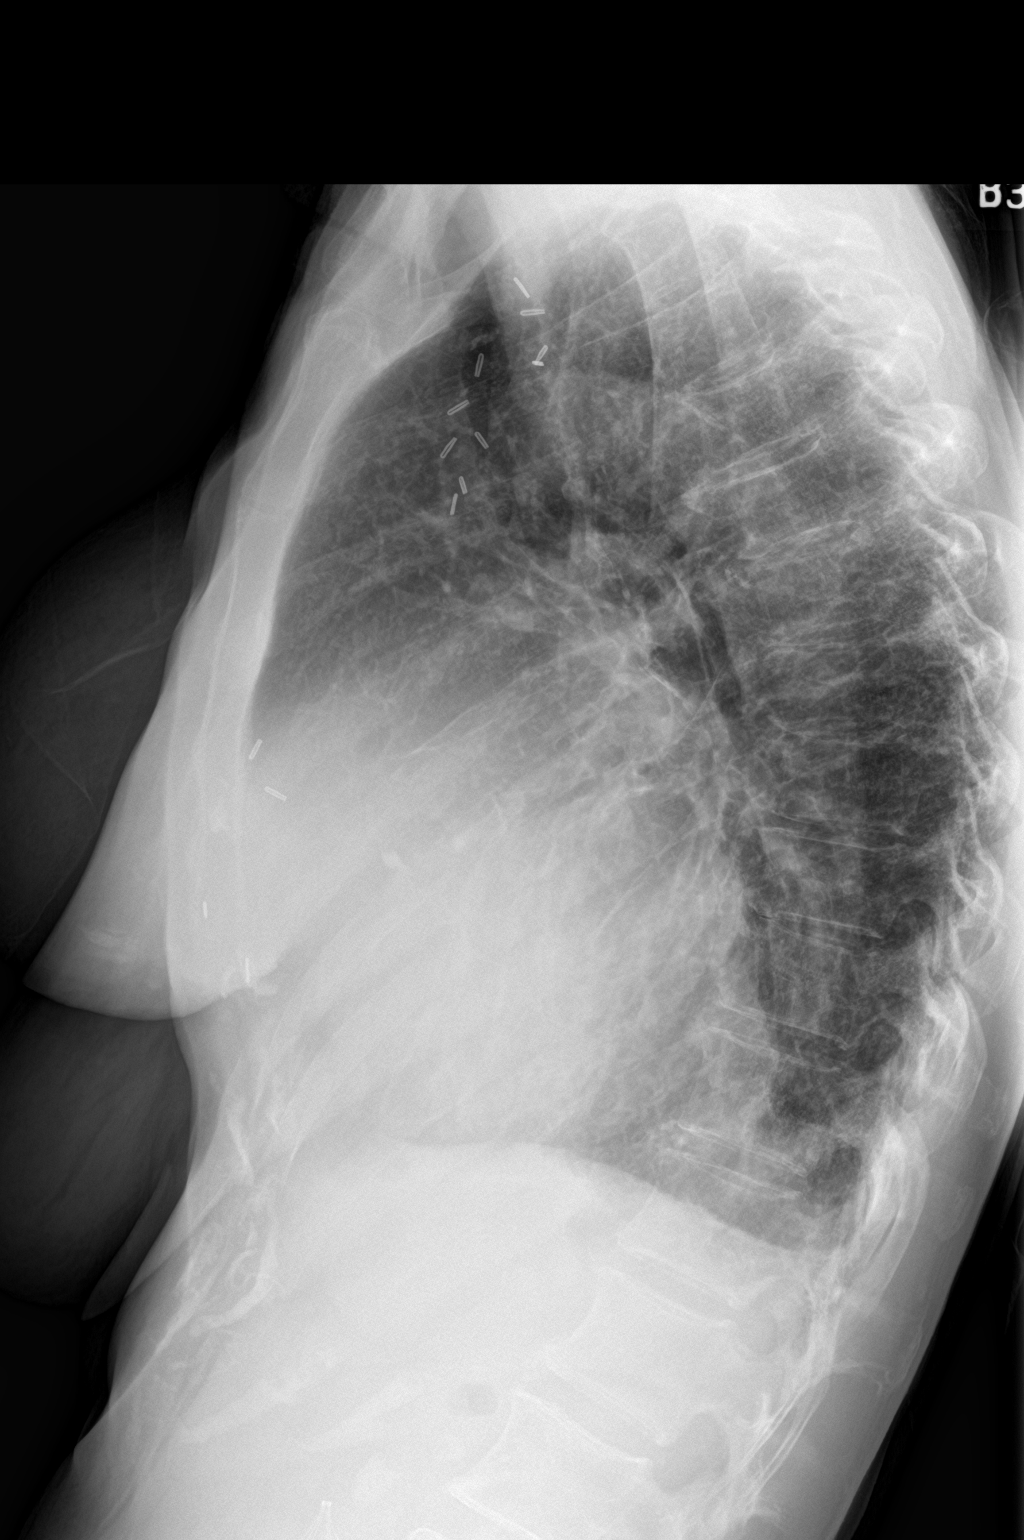

[2 of 2 positions shown; findings below may reference images not displayed]

FINDINGS: There is little change in significant cardiomegaly as noted
previously. No definite pneumonia or pleural effusion is seen. The
lungs remain somewhat hyperaerated. Prominent and coarse
interstitial markings are consistent with chronic lung disease. No
acute bony abnormality is seen. Surgical clips overlie the left
breast and left axilla.
IMPRESSION: 1. Stable chronic change with prominent interstitial markings
diffusely.
2. Stable significant cardiomegaly.  No definite active process.

## 2018-12-29 ENCOUNTER — Ambulatory Visit: Payer: Medicare Other | Admitting: Gastroenterology

## 2018-12-29 ENCOUNTER — Other Ambulatory Visit (INDEPENDENT_AMBULATORY_CARE_PROVIDER_SITE_OTHER): Payer: Medicare Other

## 2018-12-29 ENCOUNTER — Other Ambulatory Visit: Payer: Self-pay

## 2018-12-29 ENCOUNTER — Encounter: Payer: Self-pay | Admitting: Gastroenterology

## 2018-12-29 VITALS — BP 130/50 | HR 84 | Temp 98.3°F | Ht 63.0 in | Wt 113.0 lb

## 2018-12-29 DIAGNOSIS — Z8774 Personal history of (corrected) congenital malformations of heart and circulatory system: Secondary | ICD-10-CM

## 2018-12-29 DIAGNOSIS — R634 Abnormal weight loss: Secondary | ICD-10-CM

## 2018-12-29 DIAGNOSIS — D508 Other iron deficiency anemias: Secondary | ICD-10-CM | POA: Diagnosis not present

## 2018-12-29 DIAGNOSIS — D500 Iron deficiency anemia secondary to blood loss (chronic): Secondary | ICD-10-CM | POA: Diagnosis not present

## 2018-12-29 DIAGNOSIS — E43 Unspecified severe protein-calorie malnutrition: Secondary | ICD-10-CM

## 2018-12-29 LAB — IBC + FERRITIN
Ferritin: 297.8 ng/mL — ABNORMAL HIGH (ref 10.0–291.0)
Iron: 44 ug/dL (ref 42–145)
Saturation Ratios: 19.2 % — ABNORMAL LOW (ref 20.0–50.0)
Transferrin: 164 mg/dL — ABNORMAL LOW (ref 212.0–360.0)

## 2018-12-29 NOTE — Progress Notes (Signed)
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  She failed the 6-minute walk test, her oxygen saturation dropped to 85% per patient.  She is scheduled for CT chest high resolution.  She is trying to snack in between meals and is also drinking boost once every other day.  Denies any nausea, vomiting, abdominal pain, melena or bright red blood per rectum    Relevant GI Hx: She was hospitalized August 2019 with melena and severe anemia, hemoglobin 5.1 underwent enteroscopy by Dr. Lyndel Safe, single bleeding AVM or Dieulafoy lesion in duodenum was injected and hemostatic clip placement.  Noted additional small nonbleeding AVMs in duodenum.  Hemoglobin improved, stable around 10 most recent labs November 16, 2018   Outpatient Encounter Medications as of 12/29/2018  Medication Sig  . acetaminophen (TYLENOL) 325 MG tablet Take 650 mg by mouth every 6 (six) hours as needed for mild pain.   Marland Kitchen albuterol (PROVENTIL HFA;VENTOLIN HFA) 108 (90 Base) MCG/ACT inhaler INHALE 2 PUFFS INTO THE LUNGS EVERY 6 HOURS AS NEEDED FOR WHEEZING OR SHORTNESS OF BREATH  . ALPRAZolam (XANAX) 0.25 MG tablet Take 0.5 tablets (0.125 mg total) by mouth at bedtime as needed.  . bacitracin ointment Apply 1 application topically 2 (two) times daily.  .  Cholecalciferol (VITAMIN D-1000 MAX ST) 1000 units tablet Take 3,000 Units by mouth daily.  Marland Kitchen diltiazem (CARDIZEM CD) 120 MG 24 hr capsule Take 120 mg by mouth daily.    . fluticasone (FLONASE) 50 MCG/ACT nasal spray instill 1 spray into each nostril twice a day if needed  . lidocaine (XYLOCAINE) 2 % solution Use as directed 5 mLs in the mouth or throat every 3 (three) hours as needed for mouth pain.  . meclizine (ANTIVERT) 12.5 MG tablet Take 1-2 tablets (12.5-25 mg total) by mouth 3 (three) times daily as needed for dizziness.  . montelukast (SINGULAIR) 10 MG tablet TAKE 1 TABLET(10 MG) BY MOUTH AT BEDTIME  . Mouthwashes (DRY MOUTH MOUTHWASH) LIQD Use as directed 5 mLs in the mouth or throat as needed (dry mouth).  . Multiple Vitamin (MULTIVITAMIN WITH MINERALS) TABS tablet Take 1 tablet by mouth daily. NO IRON  . mupirocin cream (BACTROBAN) 2 % Apply 1 application topically 2 (two) times daily.  Marland Kitchen omeprazole (PRILOSEC) 40 MG capsule TAKE 1 CAPSULE BY MOUTH TWICE DAILY  . OPSUMIT 10 MG TABS Take 1 tablet by mouth daily.   . pilocarpine (SALAGEN) 5 MG tablet Take 1 tablet (5 mg total) by mouth 3 (three) times daily as needed.  Marland Kitchen spironolactone (ALDACTONE) 25 MG tablet Take 0.5 tablets by mouth daily.   No facility-administered encounter medications on file as of 12/29/2018.     Allergies as of 12/29/2018 - Review Complete 12/29/2018  Allergen Reaction Noted  . Codeine Nausea Only 08/03/2008  . Other Nausea And Vomiting 04/05/2012  . Erythromycin  Nausea And Vomiting 10/17/2014  . Lisinopril  12/14/2014    Past Medical History:  Diagnosis Date  . Anemia   . Angiodysplasia of stomach and duodenum   . Arthus phenomenon   . AVM (arteriovenous malformation)   . Blood transfusion without reported diagnosis    last 4 units 12-22-15, Iron infusion x2 last -01-07-16,01-14-16.  . Breast cancer ([place]) 1989   Left  . Candida esophagitis ([place])   . Cataract   . Chronic kidney disease    Chronic  mild renal insuffiency  . CREST syndrome ([place])   . GERD (gastroesophageal reflux disease)    w/ HH  . Interstitial lung disease ([place])   . Nodule of kidney   . PONV (postoperative nausea and vomiting)   . Pulmonary embolus ([place]) 2003  . Pulmonary hypertension ([place])    followed by Dr Gaynell Face at [facility], now Dr. Maryjean Ka visit end 2'17.  . Rectal incontinence   . Renal cell carcinoma ([place])   . Scleroderma ([place])   . Tubular adenoma of colon   . Uterine polyp     Past Surgical History:  Procedure Laterality Date  . APPENDECTOMY  1962  . BREAST LUMPECTOMY  1989   left  . CATARACT EXTRACTION, BILATERAL Bilateral 12/2013  . ENTEROSCOPY N/A 01/18/2016   Procedure: ENTEROSCOPY;  Surgeon: Mauri Pole, MD;  Location: [facility] END[facility]COPY;  Service: Endoscopy;  Laterality: N/A;  . ENTEROSCOPY N/A 05/24/2018   Procedure: ENTEROSCOPY;  Surgeon: Jackquline Denmark, MD;  Location: [facility] END[facility]COPY;  Service: Endoscopy;  Laterality: N/A;  . ESOPHAGOGASTRODUODENOSCOPY (EGD) WITH PROPOFOL N/A 12/21/2015   Procedure: ESOPHAGOGASTRODUODENOSCOPY (EGD) WITH PROPOFOL;  Surgeon: Irene Shipper, MD;  Location: [facility] END[facility];  Service: Endoscopy;  Laterality: N/A;  . HOT HEMOSTASIS N/A 05/24/2018   Procedure: HOT HEMOSTASIS (ARGON PLASMA COAGULATION/BICAP);  Surgeon: Jackquline Denmark, MD;  Location: [facility];  Service: Endoscopy;  Laterality: N/A;  . IR GENERIC HISTORICAL  06/05/2016   IR RADIOLOGIST EVAL & MGMT 06/05/2016 Aletta Edouard, MD [facility] INTERV RAD  . IVC Filter    . KIDNEY SURGERY     left -"laser surgery by Dr. Kathlene Cote- 5 yrs ago" no removal  . SCHLEROTHERAPY  05/24/2018   Procedure: Woodward Ku;  Surgeon: Jackquline Denmark, MD;  Location: [facility];  Service: Endoscopy;;  . TONSILLECTOMY    . TUBAL LIGATION      Family History  Problem Relation Age of Onset  . Bladder Cancer Father   . Diabetes Father   . Prostate cancer Father   . Alzheimer's disease Mother   . Diabetes Sister   . Lung cancer  Sister         smoker  . Esophageal cancer Paternal Uncle   . Colon cancer Neg Hx     Social History   Socioeconomic History  . Marital status: Married    Spouse name: Not on file  . Number of children: 2  . Years of education: Not on file  . Highest education level: Not on file  Occupational History  . Occupation: Retired  Scientific laboratory technician  . Financial resource strain: Not hard at all  . Food insecurity:    Worry: Never true    Inability: Never true  . Transportation needs:    Medical: No    Non-medical: No  Tobacco Use  . Smoking status: Never Smoker  . Smokeless tobacco: Never Used  Substance and Sexual Activity  . Alcohol use: No  . Drug use: No  . Sexual activity: Never  Lifestyle  . Physical activity:    Days per week: 2 days    Minutes per session: 40 min  . Stress: Only a little  Relationships  . Social connections:    Talks on phone: More than three times a week    Gets together: More than three times a week    Attends religious service: More than 4 times per year    Active member of club or organization: Yes    Attends meetings of clubs or organizations: More than 4 times per year    Relationship status: Married  . Intimate partner violence:    Fear of current or ex partner: No    Emotionally abused: No    Physically abused: No    Forced sexual activity: No  Other Topics Concern  . Not on file  Social History Narrative   Married '61   1 son- '65, 1 daughter '63; 6 children (2 adopted)   SO- SOB   Retirement- doing well   Marriage in good health   Patient has never smoked   Alcohol use- no   Pt gets regular exercise            Review of systems: Review of Systems  Constitutional: Negative for fever and chills.  Positive for lack of energy and weight loss HENT: Negative.   Eyes: Negative for blurred vision.  Respiratory: Positive for cough, shortness of breath and wheezing.   Cardiovascular: Negative for chest pain and palpitations.   Gastrointestinal: as per HPI Genitourinary: Negative for dysuria, urgency, frequency and hematuria.  Musculoskeletal: Negative for myalgias, back pain and joint pain.  Skin: Negative for itching and rash.  Neurological: Negative for dizziness, tremors, focal weakness, seizures and loss of consciousness.  Endo/Heme/Allergies: Negative for seasonal allergies.  Psychiatric/Behavioral: Negative for depression, suicidal ideas and hallucinations.  All other systems reviewed and are negative.   Physical Exam: Vitals:   12/29/18 1038  BP: (!) 130/50  Pulse: 84  Temp: 98.3 F (36.8 C)  SpO2: 96%   Body mass index is 20.02 kg/m. Gen:      No acute distress HEENT:  EOMI, sclera anicteric Neck:     No masses; no thyromegaly Lungs:    Clear to auscultation bilaterally; normal respiratory effort CV:         Regular rate and rhythm; no murmurs Abd:      + bowel sounds; soft, non-tender; no palpable masses, no distension Ext:    No edema; adequate peripheral perfusion Skin:      Warm and dry; no rash Neuro: alert and oriented x 3 Psych: normal mood and affect  Data Reviewed:  Reviewed labs, radiology imaging, old records and pertinent past GI work up   Assessment and Plan/Recommendations:  83 year old female with history of scleroderma, pulmonary hypertension, gastrointestinal AVMs here with complaints of progressive weight loss and shortness of breath Concern for possible ILD/worsening lung disease, is undergoing work-up through her pulmonologist at [facility] patient to increase calorie and protein intake Continue with snacks in between meals and also advised her to drink boost every day instead of every other day  Anemia: Stable with no sign of overt bleeding Will check CBC and iron panel Elevated MCV, B12 level was normal.  Will check folate.  GERD: Continue Protonix and antireflux measures  25 minutes was spent face-to-face with the patient. Greater than 50% of the time  used for counseling as well as treatment plan and follow-up. She had multiple questions which were  answered to her satisfaction  K. Denzil Magnuson , MD [phone]    CC: Hoyt Koch, *

## 2018-12-29 NOTE — Patient Instructions (Addendum)
Go to the basement for labs today  Eat Frequent every four hours  Follow up in 3 months   If you are age 81 or older, your body mass index should be between 23-30. Your Body mass index is 20.02 kg/m. If this is out of the aforementioned range listed, please consider follow up with your Primary Care Provider.  If you are age 53 or younger, your body mass index should be between 19-25. Your Body mass index is 20.02 kg/m. If this is out of the aformentioned range listed, please consider follow up with your Primary Care Provider.    I appreciate the  opportunity to care for you  Thank You   Harl Bowie , MD

## 2018-12-31 ENCOUNTER — Encounter: Payer: Self-pay | Admitting: Gastroenterology

## 2018-12-31 LAB — FOLATE RBC: RBC Folate: 1297 ng/mL RBC (ref >280)

## 2019-01-04 ENCOUNTER — Telehealth: Payer: Self-pay | Admitting: Gastroenterology

## 2019-01-04 ENCOUNTER — Other Ambulatory Visit: Payer: Self-pay

## 2019-01-04 DIAGNOSIS — D509 Iron deficiency anemia, unspecified: Secondary | ICD-10-CM

## 2019-01-04 NOTE — Telephone Encounter (Signed)
Pt is returning your call about labs

## 2019-01-04 NOTE — Telephone Encounter (Signed)
See result note.  

## 2019-01-17 ENCOUNTER — Telehealth: Payer: Self-pay | Admitting: *Deleted

## 2019-01-17 NOTE — Telephone Encounter (Signed)
Called patient and LVM to inform them the nurse needs to either convert their upcoming AWV to a virtual visit or reschedule the visit out into the future due to covid-19 safety measures. Nurse requested that the patient call-back and stated she would call them back at a later date.

## 2019-02-02 ENCOUNTER — Ambulatory Visit: Payer: Medicare Other | Admitting: Gastroenterology

## 2019-02-03 ENCOUNTER — Ambulatory Visit: Payer: Medicare Other

## 2019-03-21 ENCOUNTER — Other Ambulatory Visit: Payer: Self-pay

## 2019-03-21 ENCOUNTER — Telehealth: Payer: Self-pay | Admitting: *Deleted

## 2019-03-21 ENCOUNTER — Encounter: Payer: Self-pay | Admitting: Gastroenterology

## 2019-03-21 ENCOUNTER — Ambulatory Visit (INDEPENDENT_AMBULATORY_CARE_PROVIDER_SITE_OTHER): Payer: Medicare Other | Admitting: Gastroenterology

## 2019-03-21 ENCOUNTER — Other Ambulatory Visit (INDEPENDENT_AMBULATORY_CARE_PROVIDER_SITE_OTHER): Payer: Medicare Other

## 2019-03-21 VITALS — BP 120/60 | HR 76 | Temp 98.0°F | Ht 63.0 in | Wt 107.8 lb

## 2019-03-21 DIAGNOSIS — D508 Other iron deficiency anemias: Secondary | ICD-10-CM | POA: Diagnosis not present

## 2019-03-21 DIAGNOSIS — R634 Abnormal weight loss: Secondary | ICD-10-CM

## 2019-03-21 DIAGNOSIS — R630 Anorexia: Secondary | ICD-10-CM

## 2019-03-21 DIAGNOSIS — R1031 Right lower quadrant pain: Secondary | ICD-10-CM

## 2019-03-21 LAB — BASIC METABOLIC PANEL
BUN: 33 mg/dL — ABNORMAL HIGH (ref 6–23)
CO2: 26 mEq/L (ref 19–32)
Calcium: 9.5 mg/dL (ref 8.4–10.5)
Chloride: 100 mEq/L (ref 96–112)
Creatinine, Ser: 1.41 mg/dL — ABNORMAL HIGH (ref 0.40–1.20)
GFR: 35.83 mL/min — ABNORMAL LOW (ref >60.00)
Glucose, Bld: 92 mg/dL (ref 70–99)
Potassium: 4.4 mEq/L (ref 3.5–5.1)
Sodium: 136 mEq/L (ref 135–145)

## 2019-03-21 LAB — CBC WITH DIFFERENTIAL/PLATELET
Basophils Absolute: 0 10*3/uL (ref 0.0–0.1)
Basophils Relative: 0.7 % (ref 0.0–3.0)
Eosinophils Absolute: 0.1 10*3/uL (ref 0.0–0.7)
Eosinophils Relative: 1.6 % (ref 0.0–5.0)
HCT: 30.4 % — ABNORMAL LOW (ref 36.0–46.0)
Hemoglobin: 10.5 g/dL — ABNORMAL LOW (ref 12.0–15.0)
Lymphocytes Relative: 21.2 % (ref 12.0–46.0)
Lymphs Abs: 1.3 10*3/uL (ref 0.7–4.0)
MCHC: 34.5 g/dL (ref 30.0–36.0)
MCV: 103.2 fl — ABNORMAL HIGH (ref 78.0–100.0)
Monocytes Absolute: 0.4 10*3/uL (ref 0.1–1.0)
Monocytes Relative: 6.5 % (ref 3.0–12.0)
Neutro Abs: 4.3 10*3/uL (ref 1.4–7.7)
Neutrophils Relative %: 70 % (ref 43.0–77.0)
Platelets: 203 10*3/uL (ref 150.0–400.0)
RBC: 2.95 Mil/uL — ABNORMAL LOW (ref 3.87–5.11)
RDW: 13.3 % (ref 11.5–15.5)
WBC: 6.2 10*3/uL (ref 4.0–10.5)

## 2019-03-21 NOTE — Telephone Encounter (Signed)
Patient showed up in person for her appointment [place] screening done    Answered No to all questions   Covid-19 screening questions  Have you traveled in the last 14 days? If yes where? NO  Do you now or have you had a fever in the last 14 days? NO  Do you have any respiratory symptoms of shortness of breath or cough now or in the last 14 days? NO  Do you have any family members or close contacts with diagnosed or suspected Covid-19 in the past 14 days? NO  Have you been tested for Covid-19 and found to be positive? NO

## 2019-03-21 NOTE — Progress Notes (Signed)
Paula Ward    [id number]    [birth year]  Primary Care Physician:Crawford, Real Cons, MD  Referring Physician: Hoyt Koch, MD [address] [place], [place] [address]   Chief complaint: Right lower quadrant abdominal pain, decreased appetite and weight loss HPI:  Continues to have weight loss decreased appetite.  Persistent right lower quadrant abdominal pain.  She is worried about possible colon cancer.  Her bowel habits have changed, she is passing little "rabbit pellets".  No association to diet or activity for abdominal discomfort. CT chest high resolution was rescheduled and she is planning to later this month due to COVID-19 pandemic.  Colonoscopy July 12, 2014 by Dr. Olevia Perches: Sessile polyp tubular adenoma removed from ascending colon.  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  She failed the 6-minute walk test, her oxygen saturation dropped to 85% per patient.  She is scheduled for CT chest high resolution.  She is trying to snack in between meals and is also drinking boost once every other day.  Denies any nausea, vomiting, abdominal pain, melena or bright red blood per rectum    Relevant GI Hx: She was hospitalized August 2019 with melena and severe anemia, hemoglobin 5.1 underwent enteroscopy by Dr. Lyndel Safe, single bleeding AVM or Dieulafoy lesion in duodenum was injected and hemostatic clip placement. Noted additional small nonbleeding AVMs in duodenum.  Hemoglobin improved, stable around 10  most recent labs January 28,2020    Outpatient Encounter Medications as of 03/21/2019  Medication Sig  . acetaminophen (TYLENOL) 325 MG tablet Take 650 mg by mouth every 6 (six) hours as needed for mild pain.   Marland Kitchen albuterol (PROVENTIL HFA;VENTOLIN HFA) 108 (90 Base) MCG/ACT inhaler INHALE 2 PUFFS INTO THE LUNGS EVERY 6 HOURS AS NEEDED FOR WHEEZING OR SHORTNESS OF BREATH  . ALPRAZolam (XANAX) 0.25 MG tablet Take 0.5 tablets (0.125 mg total) by mouth at bedtime as needed.  . bacitracin ointment Apply 1 application topically 2 (two) times daily.  . Cholecalciferol (VITAMIN D-1000 MAX ST) 1000 units tablet Take 3,000 Units by mouth daily.  Marland Kitchen diltiazem (CARDIZEM CD) 120 MG 24 hr capsule Take 120 mg by mouth daily.    . fluticasone (FLONASE) 50 MCG/ACT nasal spray instill 1 spray into each nostril twice a day if needed  . lidocaine (XYLOCAINE) 2 % solution Use as directed 5 mLs in the mouth or throat every 3 (three) hours as needed for mouth pain.  . meclizine (ANTIVERT) 12.5 MG tablet Take 1-2 tablets (12.5-25 mg total) by mouth 3 (three) times daily as needed for dizziness.  . montelukast (SINGULAIR) 10 MG tablet TAKE 1 TABLET(10 MG) BY MOUTH AT BEDTIME  . Mouthwashes (DRY MOUTH MOUTHWASH) LIQD Use as directed 5 mLs in the mouth or throat as needed (dry mouth).  . Multiple Vitamin (MULTIVITAMIN WITH MINERALS) TABS tablet Take 1 tablet by mouth daily. NO IRON  . mupirocin cream (BACTROBAN) 2 % Apply 1 application topically 2 (two) times daily.  Marland Kitchen omeprazole (PRILOSEC) 40 MG capsule TAKE 1 CAPSULE BY MOUTH TWICE DAILY  . OPSUMIT 10  MG TABS Take 1 tablet by mouth daily.   . pilocarpine (SALAGEN) 5 MG tablet Take 1 tablet (5 mg total) by mouth 3 (three) times daily as needed.  Marland Kitchen spironolactone (ALDACTONE) 25 MG tablet Take 0.5 tablets by mouth daily.   No facility-administered encounter medications on file as of 03/21/2019.     Allergies as of 03/21/2019 - Review Complete 03/21/2019  Allergen  Reaction Noted  . Codeine Nausea Only 08/03/2008  . Other Nausea And Vomiting 04/05/2012  . Erythromycin Nausea And Vomiting 10/17/2014  . Lisinopril  12/14/2014    Past Medical History:  Diagnosis Date  . Anemia   . Angiodysplasia of stomach and duodenum   . Arthus phenomenon   . AVM (arteriovenous malformation)   . Blood transfusion without reported diagnosis    last 4 units 12-22-15, Iron infusion x2 last -01-07-16,01-14-16.  . Breast cancer ([place]) 1989   Left  . Candida esophagitis ([place])   . Cataract   . Chronic kidney disease    Chronic mild renal insuffiency  . CREST syndrome ([place])   . GERD (gastroesophageal reflux disease)    w/ HH  . Interstitial lung disease ([place])   . Nodule of kidney   . PONV (postoperative nausea and vomiting)   . Pulmonary embolus ([place]) 2003  . Pulmonary hypertension ([place])    followed by Dr Gaynell Face at [facility], now Dr. Maryjean Ka visit end 2'17.  . Rectal incontinence   . Renal cell carcinoma ([place])   . Scleroderma ([place])   . Tubular adenoma of colon   . Uterine polyp     Past Surgical History:  Procedure Laterality Date  . APPENDECTOMY  1962  . BREAST LUMPECTOMY  1989   left  . CATARACT EXTRACTION, BILATERAL Bilateral 12/2013  . ENTEROSCOPY N/A 01/18/2016   Procedure: ENTEROSCOPY;  Surgeon: Mauri Pole, MD;  Location: [facility];  Service: Endoscopy;  Laterality: N/A;  . ENTEROSCOPY N/A 05/24/2018   Procedure: ENTEROSCOPY;  Surgeon: Jackquline Denmark, MD;  [facility];  Service: Endoscopy;  Laterality: N/A;  . ESOPHAGOGASTRODUODENOSCOPY (EGD) WITH PROPOFOL N/A 12/21/2015   Procedure: ESOPHAGOGASTRODUODENOSCOPY (EGD) WITH PROPOFOL;  Surgeon: Irene Shipper, MD;  [facility];  Service: Endoscopy;  Laterality: N/A;  . HOT HEMOSTASIS N/A 05/24/2018   Procedure: HOT HEMOSTASIS (ARGON PLASMA COAGULATION/BICAP);  Surgeon: Jackquline Denmark, MD;  Location: [facility];  Service: Endoscopy;  Laterality: N/A;  . IR GENERIC HISTORICAL   06/05/2016   IR RADIOLOGIST EVAL & MGMT 06/05/2016 Aletta Edouard, MD [facility] INTERV RAD  . IVC Filter    . KIDNEY SURGERY     left -"laser surgery by Dr. Kathlene Cote- 5 yrs ago" no removal  . SCHLEROTHERAPY  05/24/2018   Procedure: Woodward Ku;  Surgeon: Jackquline Denmark, MD;  [facility];  Service: Endoscopy;;  . TONSILLECTOMY    . TUBAL LIGATION      Family History  Problem Relation Age of Onset  . Bladder Cancer Father   . Diabetes Father   . Prostate cancer Father   . Alzheimer's disease Mother   . Diabetes Sister   . Lung cancer Sister         smoker  . Esophageal cancer Paternal Uncle   . Colon cancer Neg Hx     Social History   Socioeconomic History  . Marital status: Married    Spouse name: Not on file  . Number of children: 2  . Years of education: Not on file  . Highest education level: Not  on file  Occupational History  . Occupation: Retired  Scientific laboratory technician  . Financial resource strain: Not hard at all  . Food insecurity:    Worry: Never true    Inability: Never true  . Transportation needs:    Medical: No    Non-medical: No  Tobacco Use  . Smoking status: Never Smoker  . Smokeless tobacco: Never Used  Substance and Sexual Activity  . Alcohol use: No  . Drug use: No  . Sexual activity: Never  Lifestyle  . Physical activity:    Days per week: 2 days    Minutes per session: 40 min  . Stress: Only a little  Relationships  . Social connections:    Talks on phone: More than three times a week    Gets together: More than three times a week    Attends religious service: More than 4 times per year    Active member of club or organization: Yes    Attends meetings of clubs or organizations: More than 4 times per year    Relationship status: Married  . Intimate partner violence:    Fear of current or ex partner: No    Emotionally abused: No    Physically abused: No    Forced sexual activity: No  Other Topics Concern  . Not on file  Social History  Narrative   Married '61   1 son- '65, 1 daughter '63; 6 children (2 adopted)   SO- SOB   Retirement- doing well   Marriage in good health   Patient has never smoked   Alcohol use- no   Pt gets regular exercise            Review of systems: Review of Systems  Constitutional: Negative for fever and chills.  HENT: Negative.   Eyes: Negative for blurred vision.  Respiratory: Positive for cough, shortness of breath and wheezing.   Cardiovascular: Negative for chest pain and palpitations.  Gastrointestinal: as per HPI Genitourinary: Negative for dysuria, urgency, frequency and hematuria.  Musculoskeletal: Positive for myalgias, back pain and joint pain.  Skin: Negative for itching and rash.  Neurological: Negative for dizziness, tremors, focal weakness, seizures and loss of consciousness.  Endo/Heme/Allergies: Negative for seasonal allergies.  Psychiatric/Behavioral: Negative for depression, suicidal ideas and hallucinations.  All other systems reviewed and are negative.   Physical Exam: Vitals:   03/21/19 0917  BP: 120/60  Pulse: 76  Temp: 98 F (36.7 C)  SpO2: 99%   Body mass index is 19.1 kg/m. Gen:      No acute distress, cachectic appearing HEENT:  EOMI, sclera anicteric Abd:      + bowel sounds; soft, non-tender; no palpable masses, no distension Ext:    No edema; adequate peripheral perfusion Skin:      Warm and dry; no rash Neuro: alert and oriented x 3 Psych: normal mood and affect  Data Reviewed:  Reviewed labs, radiology imaging, old records and pertinent past GI work up   Assessment and Plan/Recommendations:  83 year old female with history of scleroderma, pulmonary hypertension, ?  ILD Decreased appetite and progressive weight loss Complains of right lower quadrant abdominal pain: Likely musculoskeletal based on exam but will need to exclude any intra-abdominal pathology or mass lesion Schedule CT abdomen and pelvis with contrast  Chronic iron  deficiency anemia secondary to chronic GI blood loss with known AVMs Follow-up CBC  Given her advanced age will hold off colonoscopy, she is current with colorectal cancer screening, most recent  colonoscopy in 2015 with removal of small sessile tubular adenoma  25 minutes was spent face-to-face with the patient. Greater than 50% of the time used for counseling as well as treatment plan and follow-up. She had multiple questions which were answered to her satisfaction  K. Denzil Magnuson , MD    CC: Hoyt Koch, *

## 2019-03-21 NOTE — Patient Instructions (Signed)
You have been scheduled for a CT scan of the abdomen and pelvis at [facility] ([address]---this is in the same building as [facility]).   You are scheduled on _________ at _________. You should arrive 15 minutes prior to your appointment time for registration. Please follow the written instructions below on the day of your exam:  WARNING: IF YOU ARE ALLERGIC TO IODINE/X-RAY DYE, PLEASE NOTIFY RADIOLOGY IMMEDIATELY AT [phone]! YOU WILL BE GIVEN A 13 HOUR PREMEDICATION PREP.  1) Do not eat or drink anything after _______ (4 hours prior to your test) 2) You have been given 2 bottles of oral contrast to drink. The solution may taste better if refrigerated, but do NOT add ice or any other liquid to this solution. Shake well before drinking.    Drink 1 bottle of contrast @ ________ (2 hours prior to your exam)  Drink 1 bottle of contrast @ ________(1 hour prior to your exam)  You may take any medications as prescribed with a small amount of water, if necessary. If you take any of the following medications: METFORMIN, GLUCOPHAGE, GLUCOVANCE, AVANDAMET, RIOMET, FORTAMET, [place] MET, JANUMET, GLUMETZA or METAGLIP, you MAY be asked to HOLD this medication 48 hours AFTER the exam.  The purpose of you drinking the oral contrast is to aid in the visualization of your intestinal tract. The contrast solution may cause some diarrhea. Depending on your individual set of symptoms, you may also receive an intravenous injection of x-ray contrast/dye. Plan on being at [facility] for 30 minutes or longer, depending on the type of exam you are having performed.  This test typically takes 30-45 minutes to complete.  If you have any questions regarding your exam or if you need to reschedule, you may call the CT department at [phone] between the hours of 8:00 am and 5:00 pm, Monday-Friday.  ________________________________________________________________________  Go to the  basement for labs today  I appreciate the  opportunity to care for you  Thank You   Harl Bowie , MD

## 2019-03-22 ENCOUNTER — Other Ambulatory Visit: Payer: Self-pay

## 2019-03-22 ENCOUNTER — Telehealth: Payer: Self-pay | Admitting: Gastroenterology

## 2019-03-22 NOTE — Telephone Encounter (Signed)
Pt called wanting to speak with the nurse about her upcomming CAT scan that is sched. She stated that she has some questions.

## 2019-03-22 NOTE — Telephone Encounter (Signed)
Concerned about her kidney function and receiving the IV contrast. She is aware that she may not be able to receive it due to her elevated BUN and creatinine. She is aware the radiologist will take this into consideration before it is given as a part of the CT protocol.

## 2019-03-23 ENCOUNTER — Other Ambulatory Visit: Payer: Self-pay

## 2019-03-23 DIAGNOSIS — R1030 Lower abdominal pain, unspecified: Secondary | ICD-10-CM

## 2019-03-29 ENCOUNTER — Telehealth: Payer: Self-pay | Admitting: Internal Medicine

## 2019-03-29 NOTE — Telephone Encounter (Signed)
Noted thanks

## 2019-03-29 NOTE — Telephone Encounter (Signed)
Patient scheduled 6/12

## 2019-03-29 NOTE — Telephone Encounter (Signed)
Copied from [place] [phone]. Topic: General - Other >> Mar 29, 2019  7:42 AM Keene Breath wrote: Reason for CRM: Patient called to schedule an in-person appt. With Dr. Sharlet Salina.  Patient has irritation in her mouth and wants to be seen today.  She stated that she has had it before.  Please call to let patient know if the doctor can see her today.  CB# [phone] >> Mar 29, 2019  8:39 AM Kristine Royal J wrote: Can patient come in

## 2019-03-29 NOTE — Telephone Encounter (Signed)
Okay if symptom free but would have to be later this week as we do not have availability today or tomorrow. Can do virtual today or tomorrow to assess.

## 2019-03-29 NOTE — Telephone Encounter (Signed)
Are you okay with patient coming in if symptom free? Although she may not be able to get in with [place] today.

## 2019-03-31 ENCOUNTER — Encounter: Payer: Self-pay | Admitting: Internal Medicine

## 2019-04-01 ENCOUNTER — Telehealth: Payer: Self-pay | Admitting: *Deleted

## 2019-04-01 ENCOUNTER — Ambulatory Visit: Payer: Medicare Other | Admitting: Internal Medicine

## 2019-04-01 NOTE — Telephone Encounter (Signed)

## 2019-04-04 ENCOUNTER — Other Ambulatory Visit: Payer: Self-pay

## 2019-04-04 ENCOUNTER — Ambulatory Visit (INDEPENDENT_AMBULATORY_CARE_PROVIDER_SITE_OTHER)
Admission: RE | Admit: 2019-04-04 | Discharge: 2019-04-04 | Disposition: A | Discharge status: Home / Self Care | Payer: Medicare Other | Source: Ambulatory Visit | Attending: Gastroenterology | Admitting: Gastroenterology

## 2019-04-04 DIAGNOSIS — R634 Abnormal weight loss: Secondary | ICD-10-CM | POA: Diagnosis not present

## 2019-04-04 DIAGNOSIS — R630 Anorexia: Secondary | ICD-10-CM

## 2019-04-04 DIAGNOSIS — D508 Other iron deficiency anemias: Secondary | ICD-10-CM

## 2019-04-04 DIAGNOSIS — R1031 Right lower quadrant pain: Secondary | ICD-10-CM

## 2019-04-13 ENCOUNTER — Other Ambulatory Visit (INDEPENDENT_AMBULATORY_CARE_PROVIDER_SITE_OTHER): Payer: Medicare Other

## 2019-04-13 DIAGNOSIS — I272 Pulmonary hypertension, unspecified: Secondary | ICD-10-CM

## 2019-04-13 LAB — COMPREHENSIVE METABOLIC PANEL
ALT: 8 U/L (ref 0–35)
AST: 17 U/L (ref 0–37)
Albumin: 4.3 g/dL (ref 3.5–5.2)
Alkaline Phosphatase: 45 U/L (ref 39–117)
BUN: 29 mg/dL — ABNORMAL HIGH (ref 6–23)
CO2: 26 mEq/L (ref 19–32)
Calcium: 9.3 mg/dL (ref 8.4–10.5)
Chloride: 101 mEq/L (ref 96–112)
Creatinine, Ser: 1.11 mg/dL (ref 0.40–1.20)
GFR: 47.22 mL/min — ABNORMAL LOW (ref >60.00)
Glucose, Bld: 79 mg/dL (ref 70–99)
Potassium: 4.4 mEq/L (ref 3.5–5.1)
Sodium: 136 mEq/L (ref 135–145)
Total Bilirubin: 0.5 mg/dL (ref 0.2–1.2)
Total Protein: 6.7 g/dL (ref 6.0–8.3)

## 2019-04-13 LAB — CBC
HCT: 31.5 % — ABNORMAL LOW (ref 36.0–46.0)
Hemoglobin: 10.6 g/dL — ABNORMAL LOW (ref 12.0–15.0)
MCHC: 33.5 g/dL (ref 30.0–36.0)
MCV: 105 fl — ABNORMAL HIGH (ref 78.0–100.0)
Platelets: 197 10*3/uL (ref 150.0–400.0)
RBC: 3.01 Mil/uL — ABNORMAL LOW (ref 3.87–5.11)
RDW: 13.2 % (ref 11.5–15.5)
WBC: 5.4 10*3/uL (ref 4.0–10.5)

## 2019-05-15 ENCOUNTER — Other Ambulatory Visit: Payer: Self-pay | Admitting: Internal Medicine

## 2019-05-17 ENCOUNTER — Other Ambulatory Visit (INDEPENDENT_AMBULATORY_CARE_PROVIDER_SITE_OTHER): Payer: Medicare Other

## 2019-05-17 DIAGNOSIS — I272 Pulmonary hypertension, unspecified: Secondary | ICD-10-CM

## 2019-05-17 LAB — COMPREHENSIVE METABOLIC PANEL
ALT: 9 U/L (ref 0–35)
AST: 20 U/L (ref 0–37)
Albumin: 4.2 g/dL (ref 3.5–5.2)
Alkaline Phosphatase: 47 U/L (ref 39–117)
BUN: 32 mg/dL — ABNORMAL HIGH (ref 6–23)
CO2: 30 mEq/L (ref 19–32)
Calcium: 9.8 mg/dL (ref 8.4–10.5)
Chloride: 103 mEq/L (ref 96–112)
Creatinine, Ser: 1.12 mg/dL (ref 0.40–1.20)
GFR: 46.72 mL/min — ABNORMAL LOW (ref >60.00)
Glucose, Bld: 91 mg/dL (ref 70–99)
Potassium: 4.6 mEq/L (ref 3.5–5.1)
Sodium: 139 mEq/L (ref 135–145)
Total Bilirubin: 0.5 mg/dL (ref 0.2–1.2)
Total Protein: 6.6 g/dL (ref 6.0–8.3)

## 2019-05-17 LAB — CBC
HCT: 31.1 % — ABNORMAL LOW (ref 36.0–46.0)
Hemoglobin: 10.5 g/dL — ABNORMAL LOW (ref 12.0–15.0)
MCHC: 33.6 g/dL (ref 30.0–36.0)
MCV: 104.4 fl — ABNORMAL HIGH (ref 78.0–100.0)
Platelets: 194 10*3/uL (ref 150.0–400.0)
RBC: 2.98 Mil/uL — ABNORMAL LOW (ref 3.87–5.11)
RDW: 13.2 % (ref 11.5–15.5)
WBC: 5.4 10*3/uL (ref 4.0–10.5)

## 2019-06-13 ENCOUNTER — Encounter: Payer: Self-pay | Admitting: Gastroenterology

## 2019-06-17 ENCOUNTER — Other Ambulatory Visit (INDEPENDENT_AMBULATORY_CARE_PROVIDER_SITE_OTHER): Payer: Medicare Other

## 2019-06-17 DIAGNOSIS — I272 Pulmonary hypertension, unspecified: Secondary | ICD-10-CM | POA: Diagnosis not present

## 2019-06-17 LAB — COMPREHENSIVE METABOLIC PANEL
ALT: 9 U/L (ref 0–35)
AST: 19 U/L (ref 0–37)
Albumin: 4.3 g/dL (ref 3.5–5.2)
Alkaline Phosphatase: 49 U/L (ref 39–117)
BUN: 32 mg/dL — ABNORMAL HIGH (ref 6–23)
CO2: 28 mEq/L (ref 19–32)
Calcium: 9.5 mg/dL (ref 8.4–10.5)
Chloride: 102 mEq/L (ref 96–112)
Creatinine, Ser: 1.3 mg/dL — ABNORMAL HIGH (ref 0.40–1.20)
GFR: 39.33 mL/min — ABNORMAL LOW (ref >60.00)
Glucose, Bld: 95 mg/dL (ref 70–99)
Potassium: 4.6 mEq/L (ref 3.5–5.1)
Sodium: 138 mEq/L (ref 135–145)
Total Bilirubin: 0.5 mg/dL (ref 0.2–1.2)
Total Protein: 6.7 g/dL (ref 6.0–8.3)

## 2019-06-17 LAB — CBC
HCT: 30.3 % — ABNORMAL LOW (ref 36.0–46.0)
Hemoglobin: 10.3 g/dL — ABNORMAL LOW (ref 12.0–15.0)
MCHC: 34 g/dL (ref 30.0–36.0)
MCV: 103.9 fl — ABNORMAL HIGH (ref 78.0–100.0)
Platelets: 205 10*3/uL (ref 150.0–400.0)
RBC: 2.92 Mil/uL — ABNORMAL LOW (ref 3.87–5.11)
RDW: 13.3 % (ref 11.5–15.5)
WBC: 7.5 10*3/uL (ref 4.0–10.5)

## 2019-06-20 ENCOUNTER — Other Ambulatory Visit: Payer: Self-pay | Admitting: Internal Medicine

## 2019-06-21 ENCOUNTER — Other Ambulatory Visit: Payer: Self-pay | Admitting: Internal Medicine

## 2019-06-24 ENCOUNTER — Telehealth: Payer: Self-pay | Admitting: Gastroenterology

## 2019-06-24 NOTE — Telephone Encounter (Signed)
Called the patient back. No answer. Left a message on her voicemail to call back when she is available.

## 2019-06-24 NOTE — Telephone Encounter (Signed)
Pt reported that she is having GERD symptoms and would like to discuss.

## 2019-06-28 NOTE — Telephone Encounter (Signed)
She complains of "silent reflux" with a "nasty taste" in her mouth. She would like to discuss this in detail with her GI provider. She is tired of her cough and her other symptoms.

## 2019-07-11 ENCOUNTER — Encounter: Payer: Self-pay | Admitting: Gastroenterology

## 2019-07-11 ENCOUNTER — Ambulatory Visit (INDEPENDENT_AMBULATORY_CARE_PROVIDER_SITE_OTHER): Payer: Medicare Other | Admitting: Gastroenterology

## 2019-07-11 VITALS — BP 140/50 | HR 80 | Temp 98.1°F | Ht 63.0 in | Wt 104.2 lb

## 2019-07-11 DIAGNOSIS — R0989 Other specified symptoms and signs involving the circulatory and respiratory systems: Secondary | ICD-10-CM

## 2019-07-11 DIAGNOSIS — K219 Gastro-esophageal reflux disease without esophagitis: Secondary | ICD-10-CM | POA: Diagnosis not present

## 2019-07-11 DIAGNOSIS — D500 Iron deficiency anemia secondary to blood loss (chronic): Secondary | ICD-10-CM

## 2019-07-11 DIAGNOSIS — K5904 Chronic idiopathic constipation: Secondary | ICD-10-CM

## 2019-07-11 DIAGNOSIS — K5520 Angiodysplasia of colon without hemorrhage: Secondary | ICD-10-CM | POA: Diagnosis not present

## 2019-07-11 MED ORDER — OMEPRAZOLE 40 MG PO CPDR: 40.0000 mg | DELAYED_RELEASE_CAPSULE | Freq: Every day | ORAL | 3 refills | Status: DC

## 2019-07-11 NOTE — Progress Notes (Signed)
Paula Ward    [id number]    07-Nov-1935  Primary Care Physician:Crawford, Real Cons, MD  Referring Physician: Hoyt Koch, MD [place],  [place] [address]   Chief complaint:  SOB, hoarseness, lump in throat  HPI: 83 year old female with complaints of persistent shortness of breath.  She feels silent reflux is worsening her lung function.  She has a constant lump in her throat and also has to clear her throat constantly.  Weight is stable.  Denies any dysphagia or odynophagia. Denies any nausea, vomiting, abdominal pain, melena or bright red blood per rectum   CT abdomen and pelvis 04/04/2019: Large amount of stool through out the colon, colon diverticulosis otherwise no acute pathology Large pericardial effusion, limited imaging.  CT chest UIP pattern fibrosis  She followed with her pulmonologist at [facility] recently,had echocardiogram did not show any worsening heart failure or pericardial effusion. She failed the 6-minute walk test, her oxygen saturation dropped to 85% per patient.   Is not on home oxygen, shortness of breath is worse with activity or exertion  Outpatient Encounter Medications as of 07/11/2019  Medication Sig  . acetaminophen (TYLENOL) 325 MG tablet Take 650 mg by mouth every 6 (six) hours as needed for mild pain.   Marland Kitchen albuterol (PROVENTIL HFA;VENTOLIN HFA) 108 (90 Base) MCG/ACT inhaler INHALE 2 PUFFS INTO THE LUNGS EVERY 6 HOURS AS NEEDED FOR WHEEZING OR SHORTNESS OF BREATH  . ALPRAZolam (XANAX) 0.25 MG tablet Take 0.5 tablets (0.125 mg total) by mouth at bedtime as needed.  . bacitracin ointment Apply 1 application topically 2 (two) times daily.  Marland Kitchen diltiazem (CARDIZEM CD) 120 MG 24 hr capsule Take 120 mg by mouth daily.    . fluticasone (FLONASE) 50 MCG/ACT nasal spray instill 1 spray into each nostril twice a day if needed  . lidocaine (XYLOCAINE) 2 % solution Use as directed 5 mLs in the mouth or throat every 3  (three) hours as needed for mouth pain.  . meclizine (ANTIVERT) 12.5 MG tablet Take 1-2 tablets (12.5-25 mg total) by mouth 3 (three) times daily as needed for dizziness.  . montelukast (SINGULAIR) 10 MG tablet TAKE 1 TABLET(10 MG) BY MOUTH AT BEDTIME  . Mouthwashes (DRY MOUTH MOUTHWASH) LIQD Use as directed 5 mLs in the mouth or throat as needed (dry mouth).  . Multiple Vitamin (MULTIVITAMIN WITH MINERALS) TABS tablet Take 1 tablet by mouth daily. NO IRON  . mupirocin cream (BACTROBAN) 2 % Apply 1 application topically 2 (two) times daily.  . OPSUMIT 10 MG TABS Take 1 tablet by mouth daily.   . pantoprazole (PROTONIX) 40 MG tablet TAKE 1 TABLET(40 MG) BY MOUTH DAILY  . pilocarpine (SALAGEN) 5 MG tablet TAKE 1 TABLET(5 MG) BY MOUTH THREE TIMES DAILY AS NEEDED  . spironolactone (ALDACTONE) 25 MG tablet Take 0.5 tablets by mouth daily.   No facility-administered encounter medications on file as of 07/11/2019.     Allergies as of 07/11/2019 - Review Complete 07/11/2019  Allergen Reaction Noted  . Codeine Nausea Only 08/03/2008  . Other Nausea And Vomiting 04/05/2012  . Erythromycin Nausea And Vomiting 10/17/2014  . Lisinopril  12/14/2014    Past Medical History:  Diagnosis Date  . Anemia   . Angiodysplasia of stomach and duodenum   . Arthus phenomenon   . AVM (arteriovenous malformation)   . Blood transfusion without reported diagnosis    last 4 units 12-22-15, Iron infusion x2  last -01-07-16,01-14-16.  . Breast cancer ([place]) 1989   Left  . Candida esophagitis ([place])   . Cataract   . Chronic kidney disease    Chronic mild renal insuffiency  . CREST syndrome ([place])   . GERD (gastroesophageal reflux disease)    w/ HH  . Interstitial lung disease ([place])   . Nodule of kidney   . PONV (postoperative nausea and vomiting)   . Pulmonary embolus ([place]) 2003  . Pulmonary hypertension ([place])    followed by Dr Gaynell Face at [facility], now Dr. Maryjean Ka visit end 2'17.  . Rectal incontinence   .  Renal cell carcinoma ([place])   . Scleroderma ([place])   . Tubular adenoma of colon   . Uterine polyp     Past Surgical History:  Procedure Laterality Date  . APPENDECTOMY  1962  . BREAST LUMPECTOMY  1989   left  . CATARACT EXTRACTION, BILATERAL Bilateral 12/2013  . ENTEROSCOPY N/A 01/18/2016   Procedure: ENTEROSCOPY;  Surgeon: Mauri Pole, MD;  [facility];  Service: Endoscopy;  Laterality: N/A;  . ENTEROSCOPY N/A 05/24/2018   Procedure: ENTEROSCOPY;  Surgeon: Jackquline Denmark, MD;  [facility];  Service: Endoscopy;  Laterality: N/A;  . ESOPHAGOGASTRODUODENOSCOPY (EGD) WITH PROPOFOL N/A 12/21/2015   Procedure: ESOPHAGOGASTRODUODENOSCOPY (EGD) WITH PROPOFOL;  Surgeon: Irene Shipper, MD;  [facility];  Service: Endoscopy;  Laterality: N/A;  . HOT HEMOSTASIS N/A 05/24/2018   Procedure: HOT HEMOSTASIS (ARGON PLASMA COAGULATION/BICAP);  Surgeon: Jackquline Denmark, MD;  [facility];  Service: Endoscopy;  Laterality: N/A;  . IR GENERIC HISTORICAL  06/05/2016   IR RADIOLOGIST EVAL & MGMT 06/05/2016 Aletta Edouard, MD [facility] INTERV RAD  . IVC Filter    . KIDNEY SURGERY     left -"laser surgery by Dr. Kathlene Cote- 5 yrs ago" no removal  . SCHLEROTHERAPY  05/24/2018   Procedure: Woodward Ku;  Surgeon: Jackquline Denmark, MD;  [facility];  Service: Endoscopy;;  . TONSILLECTOMY    . TUBAL LIGATION      Family History  Problem Relation Age of Onset  . Bladder Cancer Father   . Diabetes Father   . Prostate cancer Father   . Alzheimer's disease Mother   . Diabetes Sister   . Lung cancer Sister         smoker  . Esophageal cancer Paternal Uncle   . Colon cancer Neg Hx     Social History   Socioeconomic History  . Marital status: Married    Spouse name: Not on file  . Number of children: 2  . Years of education: Not on file  . Highest education level: Not on file  Occupational History  . Occupation: Retired  Scientific laboratory technician  . Financial resource  strain: Not hard at all  . Food insecurity    Worry: Never true    Inability: Never true  . Transportation needs    Medical: No    Non-medical: No  Tobacco Use  . Smoking status: Never Smoker  . Smokeless tobacco: Never Used  Substance and Sexual Activity  . Alcohol use: No  . Drug use: No  . Sexual activity: Never  Lifestyle  . Physical activity    Days per week: 2 days    Minutes per session: 40 min  . Stress: Only a little  Relationships  . Social connections    Talks on phone: More than three times a week    Gets together: More than three times a week  Attends religious service: More than 4 times per year    Active member of club or organization: Yes    Attends meetings of clubs or organizations: More than 4 times per year    Relationship status: Married  . Intimate partner violence    Fear of current or ex partner: No    Emotionally abused: No    Physically abused: No    Forced sexual activity: No  Other Topics Concern  . Not on file  Social History Narrative   Married '61   1 son- '65, 1 daughter '63; 6 children (2 adopted)   SO- SOB   Retirement- doing well   Marriage in good health   Patient has never smoked   Alcohol use- no   Pt gets regular exercise            Review of systems: Review of Systems  Constitutional: Negative for fever and chills.  HENT: Negative.   Eyes: Negative for blurred vision.  Respiratory: Negative for cough, shortness of breath and wheezing.   Cardiovascular: Negative for chest pain and palpitations.  Gastrointestinal: as per HPI Genitourinary: Negative for dysuria, urgency, frequency and hematuria.  Musculoskeletal: Negative for myalgias, back pain and joint pain.  Skin: Negative for itching and rash.  Neurological: Negative for dizziness, tremors, focal weakness, seizures and loss of consciousness.  Endo/Heme/Allergies: Positive for seasonal allergies.  Psychiatric/Behavioral: Negative for depression, suicidal ideas  and hallucinations.  All other systems reviewed and are negative.   Physical Exam: Vitals:   07/11/19 1041  Temp: 98.1 F (36.7 C)   Body mass index is 18.47 kg/m. Gen:      No acute distress HEENT:  EOMI, sclera anicteric Neck:     No masses; no thyromegaly Lungs:    Clear to auscultation bilaterally; normal respiratory effort CV:         Regular rate and rhythm; no murmurs Abd:      + bowel sounds; soft, non-tender; no palpable masses, no distension Ext:    No edema; adequate peripheral perfusion Skin:      Warm and dry; no rash Neuro: alert and oriented x 3 Psych: normal mood and affect  Data Reviewed:  Reviewed labs, radiology imaging, old records and pertinent past GI work up   Assessment and Plan/Recommendations:  83 year old female with history of scleroderma, pulmonary hypertension, pericardial effusion and pulmonary fibrosis/ILD  Chronic iron deficiency anemia secondary to chronic GI blood loss with known AVM.  CBC stable.  No overt GI bleeding.  GERD: Start omeprazole Discussed antireflux measures and lifestyle changes in detail  Globus sensation: Will request barium esophagram  Constipation: Continue MiraLAX 1 capful daily with goal 1-2 soft bowel movements daily   Exertional dyspnea: Patient failed 6-minute walk test, had low O2 sat, advised her to contact her pulmonologist to discuss her symptoms, she may benefit from home O2  25 minutes was spent face-to-face with the patient. Greater than 50% of the time used for counseling as well as treatment plan and follow-up. She had multiple questions which were answered to her satisfaction  K. Denzil Magnuson , MD    CC: Hoyt Koch, *

## 2019-07-11 NOTE — Patient Instructions (Addendum)
You have been scheduled for a Barium Esophogram at [facility] (1st floor of the hospital) on 9/24 at 11am. Please arrive 15 minutes prior to your appointment for registration. Make certain not to have anything to eat or drink 3 hours prior to your test. If you need to reschedule for any reason, please contact radiology at [phone] to do so. __________________________________________________________________ A barium swallow is an examination that concentrates on views of the esophagus. This tends to be a double contrast exam (barium and two liquids which, when combined, create a gas to distend the wall of the oesophagus) or single contrast (non-ionic iodine based). The study is usually tailored to your symptoms so a good history is essential. Attention is paid during the study to the form, structure and configuration of the esophagus, looking for functional disorders (such as aspiration, dysphagia, achalasia, motility and reflux) EXAMINATION You may be asked to change into a gown, depending on the type of swallow being performed. A radiologist and radiographer will perform the procedure. The radiologist will advise you of the type of contrast selected for your procedure and direct you during the exam. You will be asked to stand, sit or lie in several different positions and to hold a small amount of fluid in your mouth before being asked to swallow while the imaging is performed .In some instances you may be asked to swallow barium coated marshmallows to assess the motility of a solid food bolus. The exam can be recorded as a digital or video fluoroscopy procedure. POST PROCEDURE It will take 1-2 days for the barium to pass through your system. To facilitate this, it is important, unless otherwise directed, to increase your fluids for the next 24-48hrs and to resume your normal diet.  This test typically takes about 30 minutes to  perform. __________________________________________________________________________________   We have sent Omeprazole to your pharmacy  Call Pulmonologist at [facility]  Increase Miralax 1 capful daily  Follow up in 4 months   Gastroesophageal Reflux Disease, Adult Gastroesophageal reflux (GER) happens when acid from the stomach flows up into the tube that connects the mouth and the stomach (esophagus). Normally, food travels down the esophagus and stays in the stomach to be digested. However, when a person has GER, food and stomach acid sometimes move back up into the esophagus. If this becomes a more serious problem, the person may be diagnosed with a disease called gastroesophageal reflux disease (GERD). GERD occurs when the reflux:  Happens often.  Causes frequent or severe symptoms.  Causes problems such as damage to the esophagus. When stomach acid comes in contact with the esophagus, the acid may cause soreness (inflammation) in the esophagus. Over time, GERD may create small holes (ulcers) in the lining of the esophagus. What are the causes? This condition is caused by a problem with the muscle between the esophagus and the stomach (lower esophageal sphincter, or LES). Normally, the LES muscle closes after food passes through the esophagus to the stomach. When the LES is weakened or abnormal, it does not close properly, and that allows food and stomach acid to go back up into the esophagus. The LES can be weakened by certain dietary substances, medicines, and medical conditions, including:  Tobacco use.  Pregnancy.  Having a hiatal hernia.  Alcohol use.  Certain foods and beverages, such as coffee, chocolate, onions, and peppermint. What increases the risk? You are more likely to develop this condition if you:  Have an increased body weight.  Have a connective  tissue disorder.  Use NSAID medicines. What are the signs or symptoms? Symptoms of this condition  include:  Heartburn.  Difficult or painful swallowing.  The feeling of having a lump in the throat.  Abitter taste in the mouth.  Bad breath.  Having a large amount of saliva.  Having an upset or bloated stomach.  Belching.  Chest pain. Different conditions can cause chest pain. Make sure you see your health care provider if you experience chest pain.  Shortness of breath or wheezing.  Ongoing (chronic) cough or a night-time cough.  Wearing away of tooth enamel.  Weight loss. How is this diagnosed? Your health care provider will take a medical history and perform a physical exam. To determine if you have mild or severe GERD, your health care provider may also monitor how you respond to treatment. You may also have tests, including:  A test to examine your stomach and esophagus with a small camera (endoscopy).  A test thatmeasures the acidity level in your esophagus.  A test thatmeasures how much pressure is on your esophagus.  A barium swallow or modified barium swallow test to show the shape, size, and functioning of your esophagus. How is this treated? The goal of treatment is to help relieve your symptoms and to prevent complications. Treatment for this condition may vary depending on how severe your symptoms are. Your health care provider may recommend:  Changes to your diet.  Medicine.  Surgery. Follow these instructions at home: Eating and drinking   Follow a diet as recommended by your health care provider. This may involve avoiding foods and drinks such as: ? Coffee and tea (with or without caffeine). ? Drinks that containalcohol. ? Energy drinks and sports drinks. ? Carbonated drinks or sodas. ? Chocolate and cocoa. ? Peppermint and mint flavorings. ? Garlic and onions. ? Horseradish. ? Spicy and acidic foods, including peppers, chili powder, curry powder, vinegar, hot sauces, and barbecue sauce. ? Citrus fruit juices and citrus fruits, such as  oranges, lemons, and limes. ? Tomato-based foods, such as red sauce, chili, salsa, and pizza with red sauce. ? Fried and fatty foods, such as donuts, french fries, potato chips, and high-fat dressings. ? High-fat meats, such as hot dogs and fatty cuts of red and white meats, such as rib eye steak, sausage, ham, and bacon. ? High-fat dairy items, such as whole milk, butter, and cream cheese.  Eat small, frequent meals instead of large meals.  Avoid drinking large amounts of liquid with your meals.  Avoid eating meals during the 2-3 hours before bedtime.  Avoid lying down right after you eat.  Do not exercise right after you eat. Lifestyle   Do not use any products that contain nicotine or tobacco, such as cigarettes, e-cigarettes, and chewing tobacco. If you need help quitting, ask your health care provider.  Try to reduce your stress by using methods such as yoga or meditation. If you need help reducing stress, ask your health care provider.  If you are overweight, reduce your weight to an amount that is healthy for you. Ask your health care provider for guidance about a safe weight loss goal. General instructions  Pay attention to any changes in your symptoms.  Take over-the-counter and prescription medicines only as told by your health care provider. Do not take aspirin, ibuprofen, or other NSAIDs unless your health care provider told you to do so.  Wear loose-fitting clothing. Do not wear anything tight around your waist that causes pressure  on your abdomen.  Raise (elevate) the head of your bed about 6 inches (15 cm).  Avoid bending over if this makes your symptoms worse.  Keep all follow-up visits as told by your health care provider. This is important. Contact a health care provider if:  You have: ? New symptoms. ? Unexplained weight loss. ? Difficulty swallowing or it hurts to swallow. ? Wheezing or a persistent cough. ? A hoarse voice.  Your symptoms do not  improve with treatment. Get help right away if you:  Have pain in your arms, neck, jaw, teeth, or back.  Feel sweaty, dizzy, or light-headed.  Have chest pain or shortness of breath.  Vomit and your vomit looks like blood or coffee grounds.  Faint.  Have stool that is bloody or black.  Cannot swallow, drink, or eat. Summary  Gastroesophageal reflux happens when acid from the stomach flows up into the esophagus. GERD is a disease in which the reflux happens often, causes frequent or severe symptoms, or causes problems such as damage to the esophagus.  Treatment for this condition may vary depending on how severe your symptoms are. Your health care provider may recommend diet and lifestyle changes, medicine, or surgery.  Contact a health care provider if you have new or worsening symptoms.  Take over-the-counter and prescription medicines only as told by your health care provider. Do not take aspirin, ibuprofen, or other NSAIDs unless your health care provider told you to do so.  Keep all follow-up visits as told by your health care provider. This is important. This information is not intended to replace advice given to you by your health care provider. Make sure you discuss any questions you have with your health care provider. Document Released: 07/16/2005 Document Revised: 04/14/2018 Document Reviewed: 04/14/2018 Elsevier Patient Education  2020 [employer].

## 2019-07-14 ENCOUNTER — Other Ambulatory Visit: Payer: Self-pay

## 2019-07-14 ENCOUNTER — Ambulatory Visit (HOSPITAL_COMMUNITY)
Admission: RE | Admit: 2019-07-14 | Discharge: 2019-07-14 | Disposition: A | Discharge status: Home / Self Care | Payer: Medicare Other | Source: Ambulatory Visit | Attending: Gastroenterology | Admitting: Gastroenterology

## 2019-07-14 DIAGNOSIS — R1310 Dysphagia, unspecified: Secondary | ICD-10-CM | POA: Insufficient documentation

## 2019-07-14 DIAGNOSIS — K219 Gastro-esophageal reflux disease without esophagitis: Secondary | ICD-10-CM | POA: Insufficient documentation

## 2019-07-18 ENCOUNTER — Other Ambulatory Visit (INDEPENDENT_AMBULATORY_CARE_PROVIDER_SITE_OTHER): Payer: Medicare Other

## 2019-07-18 DIAGNOSIS — I272 Pulmonary hypertension, unspecified: Secondary | ICD-10-CM

## 2019-07-18 LAB — CBC
HCT: 28.4 % — ABNORMAL LOW (ref 36.0–46.0)
Hemoglobin: 9.5 g/dL — ABNORMAL LOW (ref 12.0–15.0)
MCHC: 33.6 g/dL (ref 30.0–36.0)
MCV: 104.4 fl — ABNORMAL HIGH (ref 78.0–100.0)
Platelets: 193 10*3/uL (ref 150.0–400.0)
RBC: 2.72 Mil/uL — ABNORMAL LOW (ref 3.87–5.11)
RDW: 13.4 % (ref 11.5–15.5)
WBC: 5.9 10*3/uL (ref 4.0–10.5)

## 2019-07-18 LAB — COMPREHENSIVE METABOLIC PANEL
ALT: 9 U/L (ref 0–35)
AST: 19 U/L (ref 0–37)
Albumin: 4.3 g/dL (ref 3.5–5.2)
Alkaline Phosphatase: 44 U/L (ref 39–117)
BUN: 34 mg/dL — ABNORMAL HIGH (ref 6–23)
CO2: 28 mEq/L (ref 19–32)
Calcium: 9.5 mg/dL (ref 8.4–10.5)
Chloride: 103 mEq/L (ref 96–112)
Creatinine, Ser: 1.25 mg/dL — ABNORMAL HIGH (ref 0.40–1.20)
GFR: 41.14 mL/min — ABNORMAL LOW (ref >60.00)
Glucose, Bld: 88 mg/dL (ref 70–99)
Potassium: 4.5 mEq/L (ref 3.5–5.1)
Sodium: 139 mEq/L (ref 135–145)
Total Bilirubin: 0.5 mg/dL (ref 0.2–1.2)
Total Protein: 6.9 g/dL (ref 6.0–8.3)

## 2019-07-20 ENCOUNTER — Encounter: Payer: Self-pay | Admitting: Gastroenterology

## 2019-07-20 ENCOUNTER — Telehealth: Payer: Self-pay | Admitting: Internal Medicine

## 2019-07-20 NOTE — Telephone Encounter (Addendum)
Dr Sharlet Salina,  Patient called to schedule an appointment for her and her husband in the office. They would like to do these on the same day so that they do not have to take as many trips out.  Her husband is needing a hospital follow up with Dr Jenny Reichmann and she would like to see you for a sore throat and the feeling of trouble swallowing. She said that she is not actually having trouble swallowing but feels like something is not right. Would you be willing to see her in the office or what would you suggest?  ** I see that Paula Ward has scheduled an appointment for her to see you on 07/27/2019 for acid reflux and fatigue.

## 2019-07-20 NOTE — Telephone Encounter (Signed)
Copied from [place] [id number]. Topic: Appointment Scheduling - Scheduling Inquiry for Clinic >> Jul 20, 2019 12:28 PM Reyne Dumas L wrote: Reason for CRM:   Pt wants to be seen for sore throat, describing swollen with sensation of trouble swallowing - but pt is not having trouble swallowing. Pt also wants husband, Josph Macho, to be seen for a hospital follow up at the same time with Dr. Jenny Reichmann. Per Gareth Eagle, send CRM. Pt can be reached at [phone].

## 2019-07-21 NOTE — Telephone Encounter (Signed)
Please advise if this is still continuing on 10/07 if she is okay to be seen in the office.

## 2019-07-21 NOTE — Telephone Encounter (Signed)
Pt states she got another call.  Pt states she is having sore throat due to acid reflux. Pt states please call again if you need to.

## 2019-07-21 NOTE — Telephone Encounter (Signed)
Sore throat is not able to be seen in office initially due to covid-19 pandemic.

## 2019-07-21 NOTE — Telephone Encounter (Signed)
It would appear she just saw GI and put on new medication but sore throat is unable to be seen in office without virtual first currently.

## 2019-07-21 NOTE — Telephone Encounter (Signed)
Called patient and informed

## 2019-07-27 ENCOUNTER — Ambulatory Visit: Payer: Medicare Other | Admitting: Internal Medicine

## 2019-08-19 ENCOUNTER — Other Ambulatory Visit: Payer: Self-pay

## 2019-08-19 ENCOUNTER — Other Ambulatory Visit: Payer: Self-pay | Admitting: Internal Medicine

## 2019-08-19 ENCOUNTER — Other Ambulatory Visit (INDEPENDENT_AMBULATORY_CARE_PROVIDER_SITE_OTHER): Payer: Medicare Other

## 2019-08-19 DIAGNOSIS — R0989 Other specified symptoms and signs involving the circulatory and respiratory systems: Secondary | ICD-10-CM | POA: Insufficient documentation

## 2019-08-19 DIAGNOSIS — I272 Pulmonary hypertension, unspecified: Secondary | ICD-10-CM

## 2019-08-19 DIAGNOSIS — R09A2 Foreign body sensation, throat: Secondary | ICD-10-CM | POA: Insufficient documentation

## 2019-08-19 LAB — COMPREHENSIVE METABOLIC PANEL
ALT: 8 U/L (ref 0–35)
AST: 17 U/L (ref 0–37)
Albumin: 4.3 g/dL (ref 3.5–5.2)
Alkaline Phosphatase: 47 U/L (ref 39–117)
BUN: 35 mg/dL — ABNORMAL HIGH (ref 6–23)
CO2: 27 mEq/L (ref 19–32)
Calcium: 9.3 mg/dL (ref 8.4–10.5)
Chloride: 104 mEq/L (ref 96–112)
Creatinine, Ser: 1.33 mg/dL — ABNORMAL HIGH (ref 0.40–1.20)
GFR: 38.29 mL/min — ABNORMAL LOW (ref >60.00)
Glucose, Bld: 101 mg/dL — ABNORMAL HIGH (ref 70–99)
Potassium: 4.6 mEq/L (ref 3.5–5.1)
Sodium: 139 mEq/L (ref 135–145)
Total Bilirubin: 0.4 mg/dL (ref 0.2–1.2)
Total Protein: 6.7 g/dL (ref 6.0–8.3)

## 2019-08-19 LAB — CBC
HCT: 29.7 % — ABNORMAL LOW (ref 36.0–46.0)
Hemoglobin: 9.9 g/dL — ABNORMAL LOW (ref 12.0–15.0)
MCHC: 33.4 g/dL (ref 30.0–36.0)
MCV: 105.1 fl — ABNORMAL HIGH (ref 78.0–100.0)
Platelets: 198 10*3/uL (ref 150.0–400.0)
RBC: 2.83 Mil/uL — ABNORMAL LOW (ref 3.87–5.11)
RDW: 13.5 % (ref 11.5–15.5)
WBC: 5.9 10*3/uL (ref 4.0–10.5)

## 2019-08-21 ENCOUNTER — Other Ambulatory Visit: Payer: Self-pay | Admitting: Internal Medicine

## 2019-09-04 ENCOUNTER — Encounter: Payer: Self-pay | Admitting: Internal Medicine

## 2019-09-18 ENCOUNTER — Other Ambulatory Visit: Payer: Self-pay | Admitting: Internal Medicine

## 2019-09-21 ENCOUNTER — Ambulatory Visit: Payer: Medicare Other | Admitting: Gastroenterology

## 2019-09-21 ENCOUNTER — Other Ambulatory Visit: Payer: Self-pay | Admitting: Gastroenterology

## 2019-09-21 ENCOUNTER — Encounter: Payer: Self-pay | Admitting: Gastroenterology

## 2019-09-21 ENCOUNTER — Other Ambulatory Visit (INDEPENDENT_AMBULATORY_CARE_PROVIDER_SITE_OTHER): Payer: Medicare Other

## 2019-09-21 ENCOUNTER — Other Ambulatory Visit: Payer: Self-pay

## 2019-09-21 VITALS — BP 110/58 | HR 80 | Temp 97.6°F | Ht 63.0 in | Wt 104.0 lb

## 2019-09-21 DIAGNOSIS — R0602 Shortness of breath: Secondary | ICD-10-CM

## 2019-09-21 DIAGNOSIS — R05 Cough: Secondary | ICD-10-CM

## 2019-09-21 DIAGNOSIS — R059 Cough, unspecified: Secondary | ICD-10-CM

## 2019-09-21 DIAGNOSIS — J849 Interstitial pulmonary disease, unspecified: Secondary | ICD-10-CM

## 2019-09-21 DIAGNOSIS — K219 Gastro-esophageal reflux disease without esophagitis: Secondary | ICD-10-CM

## 2019-09-21 LAB — CBC WITH DIFFERENTIAL/PLATELET
Basophils Absolute: 0.1 10*3/uL (ref 0.0–0.1)
Basophils Relative: 0.9 % (ref 0.0–3.0)
Eosinophils Absolute: 0 10*3/uL (ref 0.0–0.7)
Eosinophils Relative: 0.2 % (ref 0.0–5.0)
HCT: 31.6 % — ABNORMAL LOW (ref 36.0–46.0)
Hemoglobin: 10.7 g/dL — ABNORMAL LOW (ref 12.0–15.0)
Lymphocytes Relative: 8.3 % — ABNORMAL LOW (ref 12.0–46.0)
Lymphs Abs: 0.8 10*3/uL (ref 0.7–4.0)
MCHC: 33.7 g/dL (ref 30.0–36.0)
MCV: 105 fl — ABNORMAL HIGH (ref 78.0–100.0)
Monocytes Absolute: 0.4 10*3/uL (ref 0.1–1.0)
Monocytes Relative: 4.2 % (ref 3.0–12.0)
Neutro Abs: 8.8 10*3/uL — ABNORMAL HIGH (ref 1.4–7.7)
Neutrophils Relative %: 86.4 % — ABNORMAL HIGH (ref 43.0–77.0)
Platelets: 242 10*3/uL (ref 150.0–400.0)
RBC: 3.01 Mil/uL — ABNORMAL LOW (ref 3.87–5.11)
RDW: 13.8 % (ref 11.5–15.5)
WBC: 10.2 10*3/uL (ref 4.0–10.5)

## 2019-09-21 LAB — COMPREHENSIVE METABOLIC PANEL
ALT: 10 U/L (ref 0–35)
AST: 20 U/L (ref 0–37)
Albumin: 4.5 g/dL (ref 3.5–5.2)
Alkaline Phosphatase: 48 U/L (ref 39–117)
BUN: 41 mg/dL — ABNORMAL HIGH (ref 6–23)
CO2: 26 mEq/L (ref 19–32)
Calcium: 9.8 mg/dL (ref 8.4–10.5)
Chloride: 101 mEq/L (ref 96–112)
Creatinine, Ser: 1.57 mg/dL — ABNORMAL HIGH (ref 0.40–1.20)
GFR: 31.61 mL/min — ABNORMAL LOW (ref >60.00)
Glucose, Bld: 109 mg/dL — ABNORMAL HIGH (ref 70–99)
Potassium: 4.6 mEq/L (ref 3.5–5.1)
Sodium: 136 mEq/L (ref 135–145)
Total Bilirubin: 0.4 mg/dL (ref 0.2–1.2)
Total Protein: 7.1 g/dL (ref 6.0–8.3)

## 2019-09-21 MED ORDER — PANTOPRAZOLE SODIUM 40 MG PO TBEC: 40.0000 mg | DELAYED_RELEASE_TABLET | Freq: Two times a day (BID) | ORAL | 0 refills | Status: DC

## 2019-09-21 MED ORDER — SUCRALFATE 1 GM/10ML PO SUSP: 1.0000 g | Freq: Every day | ORAL | 1 refills | Status: DC

## 2019-09-21 NOTE — Patient Instructions (Signed)
Go to the basement for labs today  Increase Pantoprazole 40 mg twice a day for 3 months  We will send carafate to your pharmacy  Follow up in 3 months  If you are age 81 or older, your body mass index should be between 23-30. Your Body mass index is 18.42 kg/m. If this is out of the aforementioned range listed, please consider follow up with your Primary Care Provider.  If you are age 2 or younger, your body mass index should be between 19-25. Your Body mass index is 18.42 kg/m. If this is out of the aformentioned range listed, please consider follow up with your Primary Care Provider.   I appreciate the  opportunity to care for you  Thank You   Harl Bowie , MD

## 2019-09-21 NOTE — Progress Notes (Signed)
Paula Ward    [id number]    21-Feb-1936  Primary Care Physician:Crawford, Real Cons, MD  Referring Physician: Hoyt Koch, MD [place],  [place] [id number]   Chief complaint: GERD  HPI: 83 year old female with history of scleroderma, pulmonary hypertension, large pericardial effusion, small bowel AVM with chronic anemia here for follow-up visit Weight is stable, not loosing any more weight but has not been successful in gaining it back either She continues to have shortness of breath and cough. She is followed by [facility] for pulmonary hypertension and ILD.  Her oxygen saturation was 93% on 6 minute walk test.  She was started on new medications, Bactrim, CellCept and prednisone. She discontinued CellCept as developed severe epigastric burning sensation and pain.  She is taking tonics once daily Continues to have decreased appetite.  Denies any vomiting, dysphagia, odynophagia, abdominal pain, melena or blood per rectum.  CT abdomen and pelvis 04/04/2019: Large amount of stool through out the colon, colon diverticulosis otherwise no acute pathology Large pericardial effusion, limited imaging.  CT chest UIP pattern fibrosis  Colonoscopy July 12, 2014 by Dr. Olevia Perches: Sessile polyp tubular adenoma removed from ascending colon.  Diverticulosis  She was hospitalized August 2019 with melena and severe anemia, hemoglobin 5.1 underwent enteroscopy by Dr. Lyndel Safe, single bleeding AVM or Dieulafoy lesion in duodenum was injected and hemostatic clip placement. Noted additional small nonbleeding AVMs in duodenum.  Iron/TIBC/Ferritin/ %Sat    Component Value Date/Time   IRON 44 12/29/2018 1146   IRON 54 05/13/2017 1159   TIBC 199 (L) 05/13/2017 1159   FERRITIN 297.8 (H) 12/29/2018 1146   FERRITIN 896 (H) 05/13/2017 1159   IRONPCTSAT 19.2 (L) 12/29/2018 1146   IRONPCTSAT 27 05/13/2017 1159   IRONPCTSAT 16 12/25/2015 1521      Outpatient Encounter Medications as of 09/21/2019  Medication Sig  . acetaminophen (TYLENOL) 325 MG tablet Take 650 mg by mouth every 6 (six) hours as needed for mild pain.   Marland Kitchen albuterol (PROVENTIL HFA;VENTOLIN HFA) 108 (90 Base) MCG/ACT inhaler INHALE 2 PUFFS INTO THE LUNGS EVERY 6 HOURS AS NEEDED FOR WHEEZING OR SHORTNESS OF BREATH  . ALPRAZolam (XANAX) 0.25 MG tablet Take 0.5 tablets (0.125 mg total) by mouth at bedtime as needed.  . bacitracin ointment Apply 1 application topically 2 (two) times daily.  Marland Kitchen diltiazem (CARDIZEM CD) 120 MG 24 hr capsule Take 120 mg by mouth daily.    . fluticasone (FLONASE) 50 MCG/ACT nasal spray INSTILL 1 SPRAY INTO EACH NOSTRIL TWICE A DAY IF NEEDED  . lidocaine (XYLOCAINE) 2 % solution Use as directed 5 mLs in the mouth or throat every 3 (three) hours as needed for mouth pain.  . meclizine (ANTIVERT) 12.5 MG tablet Take 1-2 tablets (12.5-25 mg total) by mouth 3 (three) times daily as needed for dizziness.  . montelukast (SINGULAIR) 10 MG tablet TAKE 1 TABLET(10 MG) BY MOUTH AT BEDTIME  . Mouthwashes (DRY MOUTH MOUTHWASH) LIQD Use as directed 5 mLs in the mouth or throat as needed (dry mouth).  . Multiple Vitamin (MULTIVITAMIN WITH MINERALS) TABS tablet Take 1 tablet by mouth daily. NO IRON  . mupirocin cream (BACTROBAN) 2 % Apply 1 application topically 2 (two) times daily.  . OPSUMIT 10 MG TABS Take 1 tablet by mouth daily.   . pantoprazole (PROTONIX) 40 MG tablet TAKE 1 TABLET(40 MG) BY MOUTH DAILY  . pilocarpine (SALAGEN) 5 MG tablet TAKE 1  TABLET(5 MG) BY MOUTH THREE TIMES DAILY AS NEEDED  . spironolactone (ALDACTONE) 25 MG tablet Take 0.5 tablets by mouth daily.  . [DISCONTINUED] omeprazole (PRILOSEC) 40 MG capsule Take 1 capsule (40 mg total) by mouth daily.   No facility-administered encounter medications on file as of 09/21/2019.     Allergies as of 09/21/2019 - Review Complete 07/20/2019  Allergen Reaction Noted  . Codeine Nausea Only  08/03/2008  . Other Nausea And Vomiting 04/05/2012  . Erythromycin Nausea And Vomiting 10/17/2014  . Lisinopril  12/14/2014    Past Medical History:  Diagnosis Date  . Anemia   . Angiodysplasia of stomach and duodenum   . Arthus phenomenon   . AVM (arteriovenous malformation)   . Blood transfusion without reported diagnosis    last 4 units 12-22-15, Iron infusion x2 last -01-07-16,01-14-16.  . Breast cancer ([place]) 1989   Left  . Candida esophagitis ([place])   . Cataract   . Chronic kidney disease    Chronic mild renal insuffiency  . CREST syndrome ([place])   . GERD (gastroesophageal reflux disease)    w/ HH  . Interstitial lung disease ([place])   . Nodule of kidney   . PONV (postoperative nausea and vomiting)   . Pulmonary embolus ([place]) 2003  . Pulmonary hypertension ([place])    followed by Dr Gaynell Face at [facility], now Dr. Maryjean Ka visit end 2'17.  . Rectal incontinence   . Renal cell carcinoma ([place])   . Scleroderma ([place])   . Tubular adenoma of colon   . Uterine polyp     Past Surgical History:  Procedure Laterality Date  . APPENDECTOMY  1962  . BREAST LUMPECTOMY  1989   left  . CATARACT EXTRACTION, BILATERAL Bilateral 12/2013  . ENTEROSCOPY N/A 01/18/2016   Procedure: ENTEROSCOPY;  Surgeon: Mauri Pole, MD;  Location: [facility];  Service: Endoscopy;  Laterality: N/A;  . ENTEROSCOPY N/A 05/24/2018   Procedure: ENTEROSCOPY;  Surgeon: Jackquline Denmark, MD;  Location: [facility];  Service: Endoscopy;  Laterality: N/A;  . ESOPHAGOGASTRODUODENOSCOPY (EGD) WITH PROPOFOL N/A 12/21/2015   Procedure: ESOPHAGOGASTRODUODENOSCOPY (EGD) WITH PROPOFOL;  Surgeon: Irene Shipper, MD;  Location: [facility];  Service: Endoscopy;  Laterality: N/A;  . HOT HEMOSTASIS N/A 05/24/2018   Procedure: HOT HEMOSTASIS (ARGON PLASMA COAGULATION/BICAP);  Surgeon: Jackquline Denmark, MD;  Location: [facility];  Service: Endoscopy;  Laterality: N/A;  . IR GENERIC HISTORICAL  06/05/2016   IR RADIOLOGIST EVAL & MGMT  06/05/2016 Aletta Edouard, MD [facility] INTERV RAD  . IVC Filter    . KIDNEY SURGERY     left -"laser surgery by Dr. Kathlene Cote- 5 yrs ago" no removal  . SCHLEROTHERAPY  05/24/2018   Procedure: Woodward Ku;  Surgeon: Jackquline Denmark, MD;  Location: [facility];  Service: Endoscopy;;  . TONSILLECTOMY    . TUBAL LIGATION      Family History  Problem Relation Age of Onset  . Bladder Cancer Father   . Diabetes Father   . Prostate cancer Father   . Alzheimer's disease Mother   . Diabetes Sister   . Lung cancer Sister         smoker  . Esophageal cancer Paternal Uncle   . Colon cancer Neg Hx     Social History   Socioeconomic History  . Marital status: Married    Spouse name: Not on file  . Number of children: 2  . Years of education: Not on file  . Highest education level: Not on file  Occupational  History  . Occupation: Retired  Scientific laboratory technician  . Financial resource strain: Not hard at all  . Food insecurity    Worry: Never true    Inability: Never true  . Transportation needs    Medical: No    Non-medical: No  Tobacco Use  . Smoking status: Never Smoker  . Smokeless tobacco: Never Used  Substance and Sexual Activity  . Alcohol use: No  . Drug use: No  . Sexual activity: Never  Lifestyle  . Physical activity    Days per week: 2 days    Minutes per session: 40 min  . Stress: Only a little  Relationships  . Social connections    Talks on phone: More than three times a week    Gets together: More than three times a week    Attends religious service: More than 4 times per year    Active member of club or organization: Yes    Attends meetings of clubs or organizations: More than 4 times per year    Relationship status: Married  . Intimate partner violence    Fear of current or ex partner: No    Emotionally abused: No    Physically abused: No    Forced sexual activity: No  Other Topics Concern  . Not on file  Social History Narrative   Married '61   1 son- '65, 1  daughter '63; 6 children (2 adopted)   SO- SOB   Retirement- doing well   Marriage in good health   Patient has never smoked   Alcohol use- no   Pt gets regular exercise            Review of systems: Review of Systems  Constitutional: Negative for fever and chills.  HENT: Negative.   Eyes: Negative for blurred vision.  Respiratory: Negative for cough, shortness of breath and wheezing.   Cardiovascular: Negative for chest pain and palpitations.  Gastrointestinal: as per HPI Genitourinary: Negative for dysuria, urgency, frequency and hematuria.  Musculoskeletal: Negative for myalgias, back pain and joint pain.  Skin: Negative for itching and rash.  Neurological: Negative for dizziness, tremors, focal weakness, seizures and loss of consciousness.  Endo/Heme/Allergies: Positive for seasonal allergies.  Psychiatric/Behavioral: Negative for depression, suicidal ideas and hallucinations.  All other systems reviewed and are negative.   Physical Exam: Vitals:   09/21/19 1329  BP: (!) 110/58  Pulse: 80  Temp: 97.6 F (36.4 C)   Body mass index is 18.42 kg/m. Gen:      No acute distress HEENT:  EOMI, sclera anicteric Neck:     No masses; no thyromegaly Lungs:    Clear to auscultation bilaterally; normal respiratory effort CV:         Regular rate and rhythm; no murmurs Abd:      + bowel sounds; soft, non-tender; no palpable masses, no distension Ext:    No edema; adequate peripheral perfusion Skin:      Warm and dry; no rash Neuro: alert and oriented x 3 Psych: normal mood and affect  Data Reviewed:  Reviewed labs, radiology imaging, old records and pertinent past GI work up   Assessment and Plan/Recommendations: 83 year old female with history of scleroderma, pulmonary hypertension, large pericardial effusion, pulmonary fibrosis/ILD with complaints of shortness of breath and cough  Possible GERD worsening ILD symptoms Will increase Protonix to twice daily, 30 minutes  before breakfast and dinner Discussed antireflux measures and lifestyle modifications in detail  Barium esophagram July 14, 2019 showed mild  esophageal dysmotility and reflux esophagitis but no stricture or mass lesion.  Given her advanced lung disease, will continue to monitor and avoid invasive procedure/EGD for now.  Patient denies dysphagia or odynophagia.  Epigastric burning sensation: Trial of Carafate 1 g before meals and at bedtime as needed  Continue high-protein high-calorie diet, small frequent meals to prevent weight loss  We will order CBC and CMP, patient was requested to do serial labs for monitoring by [place] pulmonologist.  Return in 3 months or sooner if needed  25 minutes was spent face-to-face with the patient. Greater than 50% of the time used for counseling as well as treatment plan and follow-up. She had multiple questions which were answered to her satisfaction  K. Denzil Magnuson , MD    CC: Hoyt Koch, *

## 2019-09-26 ENCOUNTER — Encounter: Payer: Self-pay | Admitting: Gastroenterology

## 2019-10-06 ENCOUNTER — Other Ambulatory Visit (INDEPENDENT_AMBULATORY_CARE_PROVIDER_SITE_OTHER): Payer: Medicare Other

## 2019-10-06 DIAGNOSIS — J849 Interstitial pulmonary disease, unspecified: Secondary | ICD-10-CM | POA: Diagnosis not present

## 2019-10-06 DIAGNOSIS — K219 Gastro-esophageal reflux disease without esophagitis: Secondary | ICD-10-CM

## 2019-10-06 DIAGNOSIS — R05 Cough: Secondary | ICD-10-CM | POA: Diagnosis not present

## 2019-10-06 DIAGNOSIS — R0602 Shortness of breath: Secondary | ICD-10-CM | POA: Diagnosis not present

## 2019-10-06 DIAGNOSIS — R059 Cough, unspecified: Secondary | ICD-10-CM

## 2019-10-06 LAB — COMPREHENSIVE METABOLIC PANEL
ALT: 11 U/L (ref 0–35)
AST: 18 U/L (ref 0–37)
Albumin: 4.2 g/dL (ref 3.5–5.2)
Alkaline Phosphatase: 43 U/L (ref 39–117)
BUN: 42 mg/dL — ABNORMAL HIGH (ref 6–23)
CO2: 25 mEq/L (ref 19–32)
Calcium: 9.1 mg/dL (ref 8.4–10.5)
Chloride: 102 mEq/L (ref 96–112)
Creatinine, Ser: 1.79 mg/dL — ABNORMAL HIGH (ref 0.40–1.20)
GFR: 27.17 mL/min — ABNORMAL LOW (ref >60.00)
Glucose, Bld: 232 mg/dL — ABNORMAL HIGH (ref 70–99)
Potassium: 5 mEq/L (ref 3.5–5.1)
Sodium: 136 mEq/L (ref 135–145)
Total Bilirubin: 0.4 mg/dL (ref 0.2–1.2)
Total Protein: 6.5 g/dL (ref 6.0–8.3)

## 2019-10-06 LAB — CBC WITH DIFFERENTIAL/PLATELET
Basophils Absolute: 0 10*3/uL (ref 0.0–0.1)
Basophils Relative: 0.6 % (ref 0.0–3.0)
Eosinophils Absolute: 0 10*3/uL (ref 0.0–0.7)
Eosinophils Relative: 0 % (ref 0.0–5.0)
HCT: 29.5 % — ABNORMAL LOW (ref 36.0–46.0)
Hemoglobin: 9.9 g/dL — ABNORMAL LOW (ref 12.0–15.0)
Lymphocytes Relative: 6 % — ABNORMAL LOW (ref 12.0–46.0)
Lymphs Abs: 0.5 10*3/uL — ABNORMAL LOW (ref 0.7–4.0)
MCHC: 33.6 g/dL (ref 30.0–36.0)
MCV: 106.1 fl — ABNORMAL HIGH (ref 78.0–100.0)
Monocytes Absolute: 0.1 10*3/uL (ref 0.1–1.0)
Monocytes Relative: 1.6 % — ABNORMAL LOW (ref 3.0–12.0)
Neutro Abs: 7.7 10*3/uL (ref 1.4–7.7)
Neutrophils Relative %: 91.8 % — ABNORMAL HIGH (ref 43.0–77.0)
Platelets: 224 10*3/uL (ref 150.0–400.0)
RBC: 2.78 Mil/uL — ABNORMAL LOW (ref 3.87–5.11)
RDW: 13.8 % (ref 11.5–15.5)
WBC: 8.4 10*3/uL (ref 4.0–10.5)

## 2019-10-26 ENCOUNTER — Other Ambulatory Visit (INDEPENDENT_AMBULATORY_CARE_PROVIDER_SITE_OTHER): Payer: Medicare PPO

## 2019-10-26 DIAGNOSIS — J849 Interstitial pulmonary disease, unspecified: Secondary | ICD-10-CM | POA: Diagnosis not present

## 2019-10-26 DIAGNOSIS — R059 Cough, unspecified: Secondary | ICD-10-CM

## 2019-10-26 DIAGNOSIS — K219 Gastro-esophageal reflux disease without esophagitis: Secondary | ICD-10-CM | POA: Diagnosis not present

## 2019-10-26 DIAGNOSIS — R0602 Shortness of breath: Secondary | ICD-10-CM

## 2019-10-26 DIAGNOSIS — R05 Cough: Secondary | ICD-10-CM

## 2019-10-26 LAB — CBC WITH DIFFERENTIAL/PLATELET
Basophils Absolute: 0 10*3/uL (ref 0.0–0.1)
Basophils Relative: 0.9 % (ref 0.0–3.0)
Eosinophils Absolute: 0.2 10*3/uL (ref 0.0–0.7)
Eosinophils Relative: 3.7 % (ref 0.0–5.0)
HCT: 28 % — ABNORMAL LOW (ref 36.0–46.0)
Hemoglobin: 9.4 g/dL — ABNORMAL LOW (ref 12.0–15.0)
Lymphocytes Relative: 20 % (ref 12.0–46.0)
Lymphs Abs: 1.1 10*3/uL (ref 0.7–4.0)
MCHC: 33.5 g/dL (ref 30.0–36.0)
MCV: 106.7 fl — ABNORMAL HIGH (ref 78.0–100.0)
Monocytes Absolute: 0.3 10*3/uL (ref 0.1–1.0)
Monocytes Relative: 6.3 % (ref 3.0–12.0)
Neutro Abs: 3.8 10*3/uL (ref 1.4–7.7)
Neutrophils Relative %: 69.1 % (ref 43.0–77.0)
Platelets: 240 10*3/uL (ref 150.0–400.0)
RBC: 2.63 Mil/uL — ABNORMAL LOW (ref 3.87–5.11)
RDW: 15.3 % (ref 11.5–15.5)
WBC: 5.5 10*3/uL (ref 4.0–10.5)

## 2019-10-26 LAB — COMPREHENSIVE METABOLIC PANEL
ALT: 13 U/L (ref 0–35)
AST: 18 U/L (ref 0–37)
Albumin: 4.1 g/dL (ref 3.5–5.2)
Alkaline Phosphatase: 37 U/L — ABNORMAL LOW (ref 39–117)
BUN: 42 mg/dL — ABNORMAL HIGH (ref 6–23)
CO2: 29 mEq/L (ref 19–32)
Calcium: 9.6 mg/dL (ref 8.4–10.5)
Chloride: 102 mEq/L (ref 96–112)
Creatinine, Ser: 1.48 mg/dL — ABNORMAL HIGH (ref 0.40–1.20)
GFR: 33.83 mL/min — ABNORMAL LOW (ref >60.00)
Glucose, Bld: 88 mg/dL (ref 70–99)
Potassium: 4.9 mEq/L (ref 3.5–5.1)
Sodium: 138 mEq/L (ref 135–145)
Total Bilirubin: 0.5 mg/dL (ref 0.2–1.2)
Total Protein: 6.6 g/dL (ref 6.0–8.3)

## 2019-10-27 ENCOUNTER — Ambulatory Visit: Payer: Medicare Other | Admitting: Internal Medicine

## 2019-10-28 ENCOUNTER — Encounter: Payer: Self-pay | Admitting: Internal Medicine

## 2019-10-28 ENCOUNTER — Ambulatory Visit (INDEPENDENT_AMBULATORY_CARE_PROVIDER_SITE_OTHER): Payer: Medicare PPO | Admitting: Internal Medicine

## 2019-10-28 ENCOUNTER — Other Ambulatory Visit: Payer: Self-pay

## 2019-10-28 VITALS — BP 128/50 | HR 79 | Temp 98.3°F | Ht 63.0 in | Wt 104.0 lb

## 2019-10-28 DIAGNOSIS — Z0000 Encounter for general adult medical examination without abnormal findings: Secondary | ICD-10-CM

## 2019-10-28 DIAGNOSIS — I272 Pulmonary hypertension, unspecified: Secondary | ICD-10-CM

## 2019-10-28 DIAGNOSIS — I10 Essential (primary) hypertension: Secondary | ICD-10-CM

## 2019-10-28 NOTE — Assessment & Plan Note (Signed)
BP at goal on diltiazem and spironolactone.

## 2019-10-28 NOTE — Assessment & Plan Note (Signed)
Checking CBC and CMP monthly and will forward to her pulmonary [facility] provider.

## 2019-10-28 NOTE — Assessment & Plan Note (Signed)
Flu shot up to date. Pneumonia complete. Shingrix counseled. Tetanus due 2029. Colonoscopy aged out of further. Mammogram aged out, pap smear aged out and dexa due 2021. Counseled about sun safety and mole surveillance. Counseled about the dangers of distracted driving. Given 10 year screening recommendations.

## 2019-10-28 NOTE — Patient Instructions (Addendum)
We will keep following the blood work and send it to the lung specialist at [facility].

## 2019-10-28 NOTE — Progress Notes (Signed)
   Subjective:   Patient ID: Paula Ward, female    DOB: 05/20/1936, 84 y.o.   MRN: [id number]  HPI The patient is an 84 YO female coming in for physical. No new concerns. Being safe with covid-19.  PMH, [facility], social history reviewed and updated  Review of Systems  Constitutional: Negative.   HENT: Negative.   Eyes: Negative.   Respiratory: Positive for shortness of breath. Negative for cough and chest tightness.        Stable  Cardiovascular: Negative for chest pain, palpitations and leg swelling.  Gastrointestinal: Negative for abdominal distention, abdominal pain, constipation, diarrhea, nausea and vomiting.  Musculoskeletal: Negative.   Skin: Negative.   Neurological: Negative.   Psychiatric/Behavioral: Negative.     Objective:  Physical Exam Constitutional:      Appearance: She is well-developed.  HENT:     Head: Normocephalic and atraumatic.  Cardiovascular:     Rate and Rhythm: Normal rate and regular rhythm.  Pulmonary:     Effort: Pulmonary effort is normal. No respiratory distress.     Breath sounds: Normal breath sounds. No wheezing or rales.  Abdominal:     General: Bowel sounds are normal. There is no distension.     Palpations: Abdomen is soft.     Tenderness: There is no abdominal tenderness. There is no rebound.  Musculoskeletal:     Cervical back: Normal range of motion.  Skin:    General: Skin is warm and dry.  Neurological:     Mental Status: She is alert and oriented to person, place, and time.     Coordination: Coordination normal.     Vitals:   10/28/19 1353  BP: (!) 128/50  Pulse: 79  Temp: 98.3 F (36.8 C)  TempSrc: Oral  SpO2: 96%  Weight: 104 lb (47.2 kg)  Height: _0  (1.6 m)    This visit occurred during the SARS-CoV-2 public health emergency.  Safety protocols were in place, including screening questions prior to the visit, additional usage of staff PPE, and extensive cleaning of exam room while observing appropriate contact  time as indicated for disinfecting solutions.   Assessment & Plan:

## 2019-10-31 DIAGNOSIS — Z1231 Encounter for screening mammogram for malignant neoplasm of breast: Secondary | ICD-10-CM | POA: Diagnosis not present

## 2019-11-10 DIAGNOSIS — R921 Mammographic calcification found on diagnostic imaging of breast: Secondary | ICD-10-CM | POA: Diagnosis not present

## 2019-11-18 ENCOUNTER — Telehealth: Payer: Self-pay | Admitting: Gastroenterology

## 2019-11-18 NOTE — Telephone Encounter (Signed)
Called the patient at [phone]. Patient seems to answer. Cannot hear her clearly and then the line goes dead.

## 2019-11-18 NOTE — Telephone Encounter (Signed)
Spoke with the patient. She is experiencing constipation. She took Doculax 3 days ago. She does not feel it was successful in relieving her symptoms. She continues to have passage of stool that she describes as "rabbit turds." Per Dr Silverio Decamp she is to do the Miralax purge using 7 capfuls of Miralax in 32 ounces of Gatorade. Patient states she is familiar with this and will do this for her constipation. Follow up Monday with an update.

## 2019-11-21 NOTE — Telephone Encounter (Signed)
Patient contacted. States she had success with the purge and is feeling better.

## 2019-11-22 ENCOUNTER — Other Ambulatory Visit (INDEPENDENT_AMBULATORY_CARE_PROVIDER_SITE_OTHER): Payer: Medicare PPO

## 2019-11-22 DIAGNOSIS — I272 Pulmonary hypertension, unspecified: Secondary | ICD-10-CM

## 2019-11-22 LAB — COMPREHENSIVE METABOLIC PANEL
ALT: 20 U/L (ref 0–35)
AST: 26 U/L (ref 0–37)
Albumin: 4 g/dL (ref 3.5–5.2)
Alkaline Phosphatase: 46 U/L (ref 39–117)
BUN: 23 mg/dL (ref 6–23)
CO2: 28 mEq/L (ref 19–32)
Calcium: 9.3 mg/dL (ref 8.4–10.5)
Chloride: 102 mEq/L (ref 96–112)
Creatinine, Ser: 1.25 mg/dL — ABNORMAL HIGH (ref 0.40–1.20)
GFR: 41.11 mL/min — ABNORMAL LOW (ref >60.00)
Glucose, Bld: 78 mg/dL (ref 70–99)
Potassium: 4.6 mEq/L (ref 3.5–5.1)
Sodium: 136 mEq/L (ref 135–145)
Total Bilirubin: 0.5 mg/dL (ref 0.2–1.2)
Total Protein: 6.3 g/dL (ref 6.0–8.3)

## 2019-11-22 LAB — CBC
HCT: 27 % — ABNORMAL LOW (ref 36.0–46.0)
Hemoglobin: 9 g/dL — ABNORMAL LOW (ref 12.0–15.0)
MCHC: 33.5 g/dL (ref 30.0–36.0)
MCV: 110.3 fl — ABNORMAL HIGH (ref 78.0–100.0)
Platelets: 250 10*3/uL (ref 150.0–400.0)
RBC: 2.45 Mil/uL — ABNORMAL LOW (ref 3.87–5.11)
RDW: 17.3 % — ABNORMAL HIGH (ref 11.5–15.5)
WBC: 5.1 10*3/uL (ref 4.0–10.5)

## 2019-12-02 DIAGNOSIS — L720 Epidermal cyst: Secondary | ICD-10-CM | POA: Diagnosis not present

## 2019-12-08 DIAGNOSIS — M349 Systemic sclerosis, unspecified: Secondary | ICD-10-CM | POA: Diagnosis not present

## 2019-12-08 DIAGNOSIS — R0600 Dyspnea, unspecified: Secondary | ICD-10-CM | POA: Diagnosis not present

## 2019-12-08 DIAGNOSIS — J849 Interstitial pulmonary disease, unspecified: Secondary | ICD-10-CM | POA: Diagnosis not present

## 2019-12-08 DIAGNOSIS — Z79899 Other long term (current) drug therapy: Secondary | ICD-10-CM | POA: Diagnosis not present

## 2019-12-09 DIAGNOSIS — H40023 Open angle with borderline findings, high risk, bilateral: Secondary | ICD-10-CM | POA: Diagnosis not present

## 2019-12-09 DIAGNOSIS — Z961 Presence of intraocular lens: Secondary | ICD-10-CM | POA: Diagnosis not present

## 2019-12-17 ENCOUNTER — Other Ambulatory Visit: Payer: Self-pay | Admitting: Internal Medicine

## 2019-12-19 ENCOUNTER — Other Ambulatory Visit: Payer: Self-pay | Admitting: *Deleted

## 2019-12-19 MED ORDER — PANTOPRAZOLE SODIUM 40 MG PO TBEC: 40.0000 mg | DELAYED_RELEASE_TABLET | Freq: Two times a day (BID) | ORAL | 11 refills | Status: DC

## 2019-12-19 NOTE — Progress Notes (Signed)
Sent in Protonix from a fax request

## 2019-12-23 IMAGING — RF DG ESOPHAGUS
12 series · 24 of 24 positions shown · non-contrast
Comparison: None.

CLINICAL DATA: Globus sensation.  Gastroesophageal reflux disease.

EXAM:
ESOPHOGRAM / BARIUM SWALLOW / BARIUM TABLET STUDY
TECHNIQUE: Combined double contrast and single contrast examination performed
using effervescent crystals, thick barium liquid, and thin barium
liquid. The patient was observed with fluoroscopy swallowing a 13 mm
barium sulphate tablet.
FLUOROSCOPY TIME:  Fluoroscopy Time:  3 minutes, 42 second
Radiation Exposure Index (if provided by the fluoroscopic device):
21.5 mGy
Number of Acquired Spot Images: 0

[Series 1: cp_standard · 0.34mm/px · 2 of 57 frames shown (1 of 12)]
[frame 2/57]
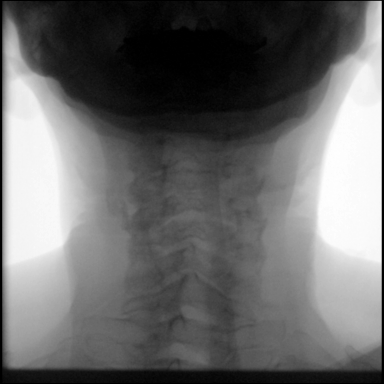
[frame 29/57]
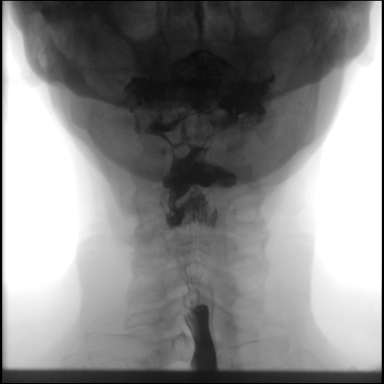

[Series 2: cp_standard · 0.34mm/px · 2 of 75 frames shown (2 of 12)]
[frame 4/75]
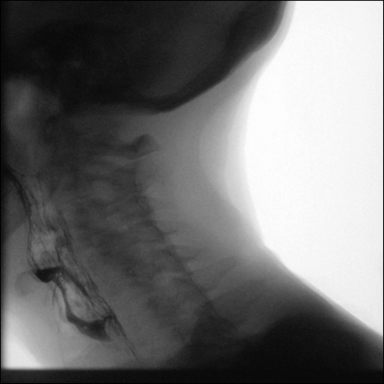
[frame 38/75]
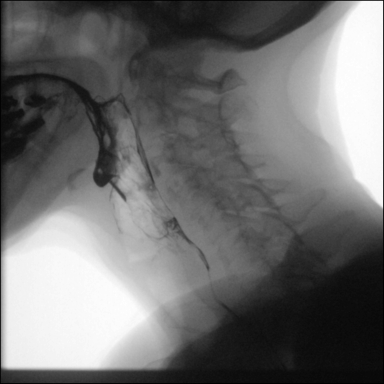

[Series 3: cp_standard · 0.34mm/px · 2 of 62 frames shown (3 of 12)]
[frame 10/62]
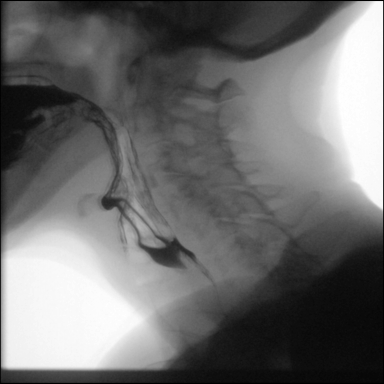
[frame 32/62]
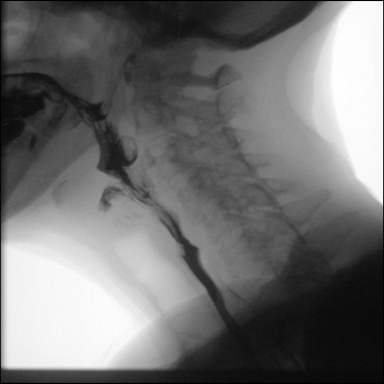

[Series 4: cp_standard · 0.35mm/px · 2 of 58 frames shown (4 of 12)]
[frame 2/58]
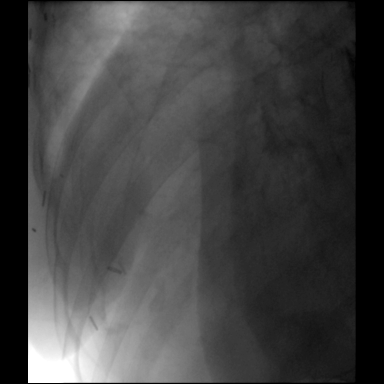
[frame 30/58]
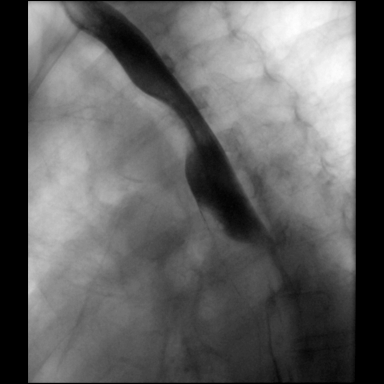

[Series 5: cp_standard · 0.35mm/px · 2 of 69 frames shown (5 of 12)]
[frame 11/69]
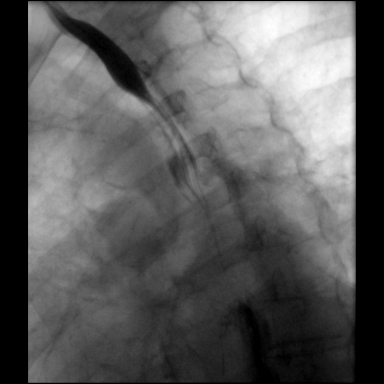
[frame 55/69]
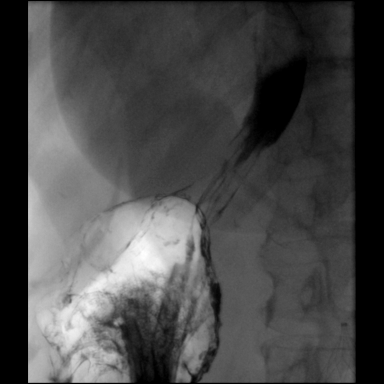

[Series 6: cp_standard · 0.35mm/px · 2 of 37 frames shown (6 of 12)]
[frame 6/37]
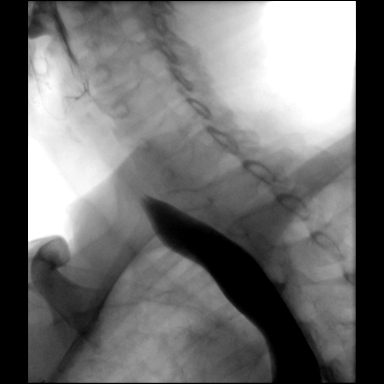
[frame 19/37]
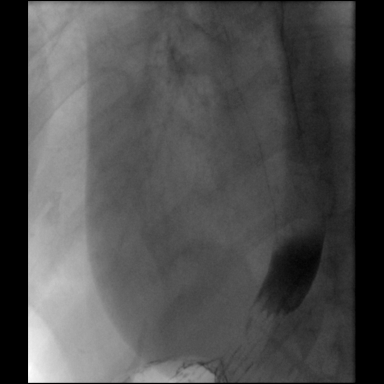

[Series 7: cp_standard · 0.35mm/px · 2 of 42 frames shown (7 of 12)]
[frame 7/42]
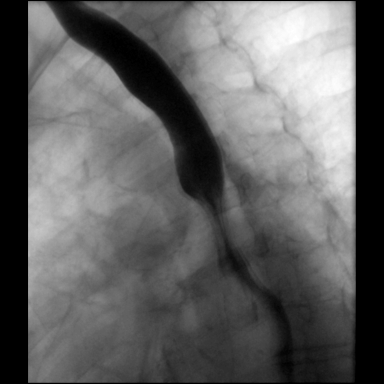
[frame 23/42]
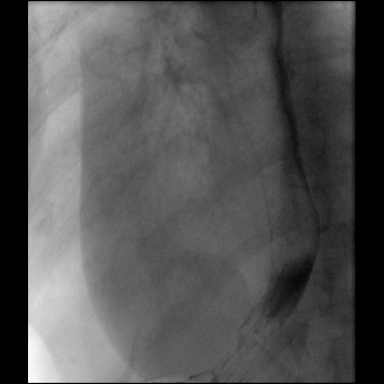

[Series 8: cp_standard · 0.35mm/px · 2 of 45 frames shown (8 of 12)]
[frame 7/45]
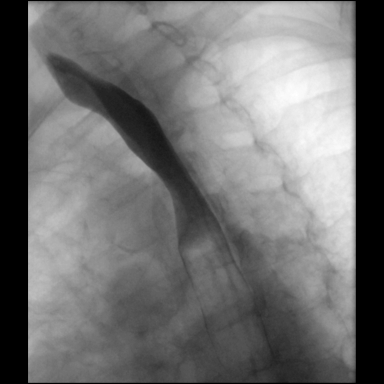
[frame 39/45]
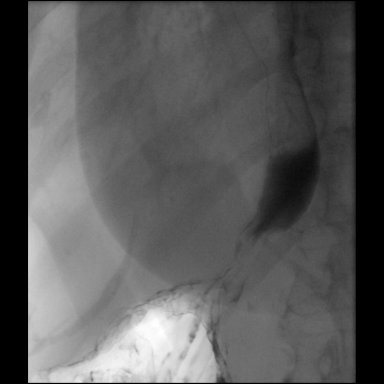

[Series 9: cp_standard · 0.35mm/px · 2 of 53 frames shown (9 of 12)]
[frame 27/53]
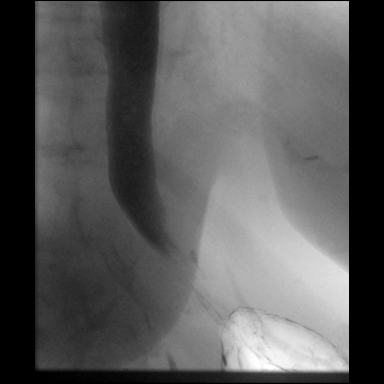
[frame 51/53]
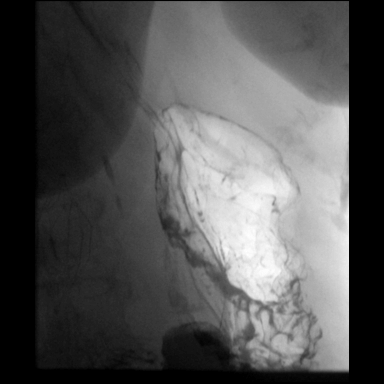

[Series 10: cp_standard · 0.52mm/px · 2 of 34 frames shown (10 of 12)]
[frame 6/34]
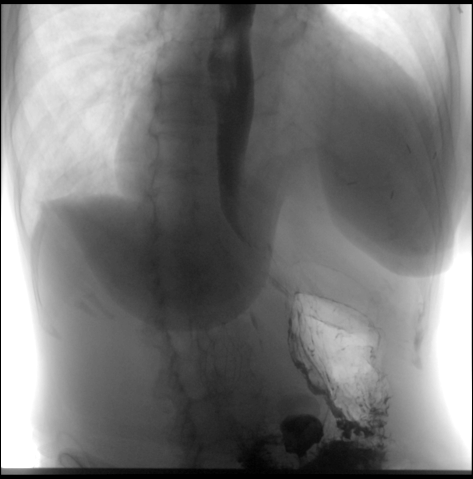
[frame 29/34]
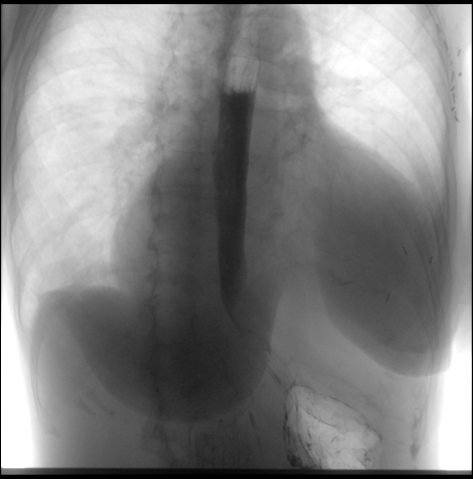

[Series 11: cp_standard · 0.35mm/px · 2 of 72 frames shown (11 of 12)]
[frame 29/72]
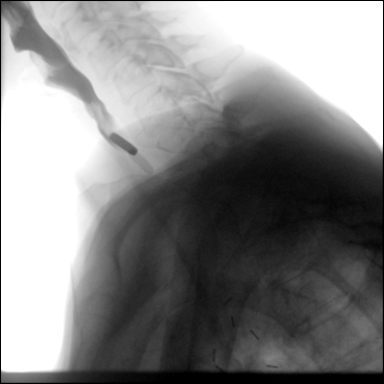
[frame 62/72]
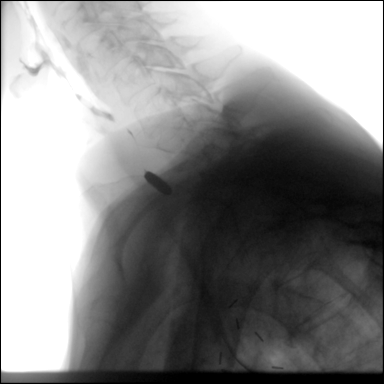

[Series 12: cp_standard · 0.35mm/px · 2 of 67 frames shown (12 of 12)]
[frame 11/67]
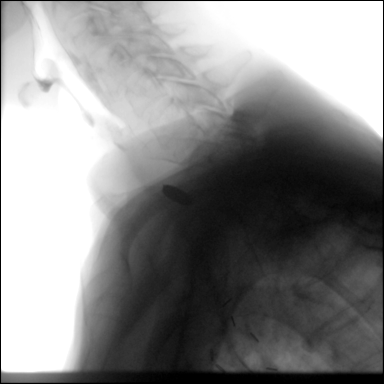
[frame 57/67]
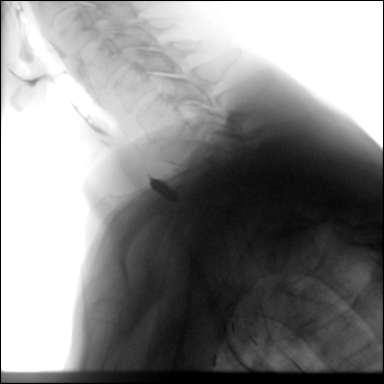

[24 of 24 positions shown; findings below may reference images not displayed]

FINDINGS: Marked enlargement of the cardiopericardial silhouette, previously
shown to be due to a very large pericardial effusion as shown on
04/04/2019. Left axillary clips noted.

Laryngeal penetration contrast nearly down to the cords. Vallecular
and piriform residue which mostly clears with secondary swallows.

During swallows, at the level of the vallecula, there is transient
dilatation of a left lateral pharyngeal diverticulum as shown on
image 44/1 and also appreciable on image 53/3.

Although not appreciable on the initial pharyngeal phase images,
there is potentially smooth narrowing of the lower cervical
esophagus for example on image [DATE], a 13 mm barium tablet impacted
in this vicinity and did not clear with multiple swallows.

There is a suspected small transient type 1 hiatal hernia along with
considerable fold thickening in the distal esophagus. Do not see a
definite ulcer in this vicinity. Was difficult to fully distend the
distal esophagus, maximum distention was up to 1.2 cm in diameter.

Primary peristaltic waves were disrupted in the upper thoracic
esophagus on all swallows. Series 9 and 10 were obtained with the
patient standing in order to use gravity to encourage flow into the
stomach.
IMPRESSION: 1. Prominent esophageal dysmotility with disruption of primary
peristaltic waves in the upper thoracic esophagus on all swallows.
2. Considerable distal esophageal fold thickening suggesting reflux
related esophagitis. I do not see compelling evidence of tumor
although the irregular folds may warrant endoscopy for further
characterization.
3. The barium tablet got stuck in the lower cervical esophagus.
Initially I did not see any narrowing in this vicinity, although
given the sticking of the tablet in the area, smooth stenosis is
raised as a possibility. I do not see a discrete filling defect or
tumor.
4. Laryngeal penetration of contrast with swallows common no overt
tracheal aspiration observed.
5. Left lateral pharyngeal diverticulum, transiently visible during
swallowing.

## 2019-12-26 ENCOUNTER — Other Ambulatory Visit (INDEPENDENT_AMBULATORY_CARE_PROVIDER_SITE_OTHER): Payer: Medicare PPO

## 2019-12-26 DIAGNOSIS — I272 Pulmonary hypertension, unspecified: Secondary | ICD-10-CM | POA: Diagnosis not present

## 2019-12-26 LAB — COMPREHENSIVE METABOLIC PANEL
ALT: 9 U/L (ref 0–35)
AST: 17 U/L (ref 0–37)
Albumin: 3.7 g/dL (ref 3.5–5.2)
Alkaline Phosphatase: 40 U/L (ref 39–117)
BUN: 29 mg/dL — ABNORMAL HIGH (ref 6–23)
CO2: 28 mEq/L (ref 19–32)
Calcium: 9.1 mg/dL (ref 8.4–10.5)
Chloride: 102 mEq/L (ref 96–112)
Creatinine, Ser: 1.26 mg/dL — ABNORMAL HIGH (ref 0.40–1.20)
GFR: 40.72 mL/min — ABNORMAL LOW (ref >60.00)
Glucose, Bld: 84 mg/dL (ref 70–99)
Potassium: 4.1 mEq/L (ref 3.5–5.1)
Sodium: 136 mEq/L (ref 135–145)
Total Bilirubin: 0.7 mg/dL (ref 0.2–1.2)
Total Protein: 6 g/dL (ref 6.0–8.3)

## 2019-12-26 LAB — CBC
HCT: 26.7 % — ABNORMAL LOW (ref 36.0–46.0)
Hemoglobin: 9.1 g/dL — ABNORMAL LOW (ref 12.0–15.0)
MCHC: 34.1 g/dL (ref 30.0–36.0)
MCV: 115 fl — ABNORMAL HIGH (ref 78.0–100.0)
Platelets: 209 10*3/uL (ref 150.0–400.0)
RBC: 2.32 Mil/uL — ABNORMAL LOW (ref 3.87–5.11)
RDW: 18.3 % — ABNORMAL HIGH (ref 11.5–15.5)
WBC: 5 10*3/uL (ref 4.0–10.5)

## 2019-12-30 ENCOUNTER — Telehealth: Payer: Self-pay | Admitting: Internal Medicine

## 2019-12-30 DIAGNOSIS — I272 Pulmonary hypertension, unspecified: Secondary | ICD-10-CM

## 2019-12-30 NOTE — Addendum Note (Signed)
Addended by: Pricilla Holm A on: 12/30/2019 04:41 PM   Modules accepted: Orders

## 2019-12-30 NOTE — Telephone Encounter (Signed)
Orders placed

## 2019-12-30 NOTE — Telephone Encounter (Signed)
New message:   Pt states [facility] was supposed to be sending [place] and order to have the patients A1C checked. She would like to know if the order can be placed and notified if it will be. Please advise.

## 2020-01-06 ENCOUNTER — Other Ambulatory Visit: Payer: Medicare PPO

## 2020-01-06 ENCOUNTER — Other Ambulatory Visit (INDEPENDENT_AMBULATORY_CARE_PROVIDER_SITE_OTHER): Payer: Medicare PPO

## 2020-01-06 DIAGNOSIS — I272 Pulmonary hypertension, unspecified: Secondary | ICD-10-CM | POA: Diagnosis not present

## 2020-01-06 LAB — HEMOGLOBIN A1C: Hgb A1c MFr Bld: 5.2 % (ref 4.6–6.5)

## 2020-01-25 ENCOUNTER — Other Ambulatory Visit (INDEPENDENT_AMBULATORY_CARE_PROVIDER_SITE_OTHER): Payer: Medicare PPO

## 2020-01-25 ENCOUNTER — Encounter: Payer: Self-pay | Admitting: Gastroenterology

## 2020-01-25 ENCOUNTER — Ambulatory Visit: Payer: Medicare PPO | Admitting: Gastroenterology

## 2020-01-25 ENCOUNTER — Other Ambulatory Visit: Payer: Self-pay

## 2020-01-25 VITALS — BP 100/50 | HR 80 | Temp 97.8°F | Ht 63.0 in | Wt 105.6 lb

## 2020-01-25 DIAGNOSIS — D500 Iron deficiency anemia secondary to blood loss (chronic): Secondary | ICD-10-CM

## 2020-01-25 DIAGNOSIS — J849 Interstitial pulmonary disease, unspecified: Secondary | ICD-10-CM

## 2020-01-25 DIAGNOSIS — I272 Pulmonary hypertension, unspecified: Secondary | ICD-10-CM | POA: Diagnosis not present

## 2020-01-25 DIAGNOSIS — R1031 Right lower quadrant pain: Secondary | ICD-10-CM

## 2020-01-25 DIAGNOSIS — M349 Systemic sclerosis, unspecified: Secondary | ICD-10-CM | POA: Diagnosis not present

## 2020-01-25 DIAGNOSIS — K5520 Angiodysplasia of colon without hemorrhage: Secondary | ICD-10-CM | POA: Diagnosis not present

## 2020-01-25 DIAGNOSIS — R634 Abnormal weight loss: Secondary | ICD-10-CM

## 2020-01-25 LAB — COMPREHENSIVE METABOLIC PANEL
ALT: 9 U/L (ref 0–35)
AST: 17 U/L (ref 0–37)
Albumin: 4.1 g/dL (ref 3.5–5.2)
Alkaline Phosphatase: 36 U/L — ABNORMAL LOW (ref 39–117)
BUN: 25 mg/dL — ABNORMAL HIGH (ref 6–23)
CO2: 28 mEq/L (ref 19–32)
Calcium: 9.1 mg/dL (ref 8.4–10.5)
Chloride: 103 mEq/L (ref 96–112)
Creatinine, Ser: 1.19 mg/dL (ref 0.40–1.20)
GFR: 43.49 mL/min — ABNORMAL LOW (ref >60.00)
Glucose, Bld: 87 mg/dL (ref 70–99)
Potassium: 4.2 mEq/L (ref 3.5–5.1)
Sodium: 137 mEq/L (ref 135–145)
Total Bilirubin: 0.6 mg/dL (ref 0.2–1.2)
Total Protein: 6.1 g/dL (ref 6.0–8.3)

## 2020-01-25 LAB — CBC
HCT: 27.7 % — ABNORMAL LOW (ref 36.0–46.0)
Hemoglobin: 9.4 g/dL — ABNORMAL LOW (ref 12.0–15.0)
MCHC: 33.9 g/dL (ref 30.0–36.0)
MCV: 117.1 fl — ABNORMAL HIGH (ref 78.0–100.0)
Platelets: 221 10*3/uL (ref 150.0–400.0)
RBC: 2.36 Mil/uL — ABNORMAL LOW (ref 3.87–5.11)
RDW: 15.8 % — ABNORMAL HIGH (ref 11.5–15.5)
WBC: 6.4 10*3/uL (ref 4.0–10.5)

## 2020-01-25 NOTE — Progress Notes (Signed)
Paula Ward    [id number]    February 07, 1936  Primary Care Physician:Crawford, Real Cons, MD  Referring Physician: Hoyt Koch, MD [address] [place],   [address]   Chief complaint: Weight loss, chronic anemia, abdominal pain, colorectal cancer screening HPI:  84 year old female with history of scleroderma, pulmonary hypertension, large pericardial effusion, small bowel AVM with chronic anemia here for follow-up visit  Continues to have progressive weight loss. Continues to have decreased appetite.  Denies any vomiting, dysphagia, odynophagia, melena or blood per rectum. She does not feel good, at times has dizziness, feels "swimmy headed".  She is also worried about diabetes with chronic prednisone use.  She showed me her blood sugar log, ranging mostly between 90-100 for fasting and 150-180 postprandial  She continues to have shortness of breath and cough. She is followed by [facility] for pulmonary hypertension and ILD.  Her oxygen saturation was 93% on 6 minute walk test.  She was started on new medications, Bactrim, CellCept and prednisone.  She has since stopped Bactrim and CellCept, currently only on prednisone.  Right lower quadrant abdominal discomfort, chronic no relationship to diet, bowel habits or activity.  CT abdomen and pelvis 04/04/2019: Large amount of stool through out the colon, colon diverticulosis otherwise no acute pathology Large pericardial effusion, limited imaging.  CT chest UIP pattern fibrosis  Colonoscopy July 12, 2014 by Dr. Brodie:Sessile polyp tubular adenoma removed from ascending colon. Diverticulosis  She was hospitalized August 2019 with melena and severe anemia, hemoglobin 5.1 underwent enteroscopy by Dr. Lyndel Safe, single bleeding AVM or Dieulafoy lesion in duodenum was injected and hemostatic clip placement. Noted additional small nonbleeding AVMs in duodenum.   Outpatient Encounter  Medications as of 01/25/2020  Medication Sig  . acetaminophen (TYLENOL) 325 MG tablet Take 650 mg by mouth every 6 (six) hours as needed for mild pain.   Marland Kitchen albuterol (PROVENTIL HFA;VENTOLIN HFA) 108 (90 Base) MCG/ACT inhaler INHALE 2 PUFFS INTO THE LUNGS EVERY 6 HOURS AS NEEDED FOR WHEEZING OR SHORTNESS OF BREATH  . ALPRAZolam (XANAX) 0.25 MG tablet Take 0.5 tablets (0.125 mg total) by mouth at bedtime as needed.  Marland Kitchen azaTHIOprine (IMURAN) 50 MG tablet Take 2 tablets by mouth daily.  . bacitracin ointment Apply 1 application topically 2 (two) times daily.  Marland Kitchen diltiazem (CARDIZEM CD) 120 MG 24 hr capsule Take 120 mg by mouth daily.    . fluticasone (FLONASE) 50 MCG/ACT nasal spray INSTILL 1 SPRAY INTO EACH NOSTRIL TWICE A DAY IF NEEDED  . lidocaine (XYLOCAINE) 2 % solution Use as directed 5 mLs in the mouth or throat every 3 (three) hours as needed for mouth pain.  . meclizine (ANTIVERT) 12.5 MG tablet Take 1-2 tablets (12.5-25 mg total) by mouth 3 (three) times daily as needed for dizziness.  . montelukast (SINGULAIR) 10 MG tablet TAKE 1 TABLET(10 MG) BY MOUTH AT BEDTIME  . Mouthwashes (DRY MOUTH MOUTHWASH) LIQD Use as directed 5 mLs in the mouth or throat as needed (dry mouth).  . Multiple Vitamin (MULTIVITAMIN WITH MINERALS) TABS tablet Take 1 tablet by mouth daily. NO IRON  . mupirocin cream (BACTROBAN) 2 % Apply 1 application topically 2 (two) times daily.  . mycophenolate (CELLCEPT) 500 MG tablet Take 500 mg by mouth 2 (two) times daily.  Marland Kitchen omeprazole (PRILOSEC) 40 MG capsule Take 40 mg by mouth daily.  . OPSUMIT 10 MG TABS Take 1 tablet by mouth daily.   Marland Kitchen  pantoprazole (PROTONIX) 40 MG tablet Take 1 tablet (40 mg total) by mouth 2 (two) times daily.  . pilocarpine (SALAGEN) 5 MG tablet TAKE 1 TABLET(5 MG) BY MOUTH THREE TIMES DAILY AS NEEDED  . predniSONE (DELTASONE) 5 MG tablet Take 1 tablet by mouth daily.  . sucralfate (CARAFATE) 1 GM/10ML suspension TAKE 10 MLS BY MOUTH AT BEDTIME  .  sulfamethoxazole-trimethoprim (BACTRIM DS) 800-160 MG tablet Take 1 tablet by mouth 3 (three) times a week.   No facility-administered encounter medications on file as of 01/25/2020.    Allergies as of 01/25/2020 - Review Complete 10/28/2019  Allergen Reaction Noted  . Codeine Nausea Only 08/03/2008  . Other Nausea And Vomiting 04/05/2012  . Erythromycin Nausea And Vomiting 10/17/2014  . Lisinopril  12/14/2014    Past Medical History:  Diagnosis Date  . Anemia   . Angiodysplasia of stomach and duodenum   . Arthus phenomenon   . AVM (arteriovenous malformation)   . Blood transfusion without reported diagnosis    last 4 units 12-22-15, Iron infusion x2 last -01-07-16,01-14-16.  . Breast cancer ([place]) 1989   Left  . Candida esophagitis ([place])   . Cataract   . Chronic kidney disease    Chronic mild renal insuffiency  . CREST syndrome ([place])   . GERD (gastroesophageal reflux disease)    w/ HH  . Interstitial lung disease ([place])   . Nodule of kidney   . PONV (postoperative nausea and vomiting)   . Pulmonary embolus ([place]) 2003  . Pulmonary hypertension ([place])    followed by Dr Gaynell Face at [facility], now Dr. Maryjean Ka visit end 2'17.  . Rectal incontinence   . Renal cell carcinoma ([place])   . Scleroderma ([place])   . Tubular adenoma of colon   . Uterine polyp     Past Surgical History:  Procedure Laterality Date  . APPENDECTOMY  1962  . BREAST LUMPECTOMY  1989   left  . CATARACT EXTRACTION, BILATERAL Bilateral 12/2013  . ENTEROSCOPY N/A 01/18/2016   Procedure: ENTEROSCOPY;  Surgeon: Mauri Pole, MD;  Location: [facility] ENDOSCOPY;  Service: Endoscopy;  Laterality: N/A;  . ENTEROSCOPY N/A 05/24/2018   Procedure: ENTEROSCOPY;  Surgeon: Jackquline Denmark, MD;  Location: [facility] ENDOSCOPY;  Service: Endoscopy;  Laterality: N/A;  . ESOPHAGOGASTRODUODENOSCOPY (EGD) WITH PROPOFOL N/A 12/21/2015   Procedure: ESOPHAGOGASTRODUODENOSCOPY (EGD) WITH PROPOFOL;  Surgeon: Irene Shipper, MD;  Location: [facility] ENDOSCOPY;   Service: Endoscopy;  Laterality: N/A;  . HOT HEMOSTASIS N/A 05/24/2018   Procedure: HOT HEMOSTASIS (ARGON PLASMA COAGULATION/BICAP);  Surgeon: Jackquline Denmark, MD;  Location: [facility];  Service: Endoscopy;  Laterality: N/A;  . IR GENERIC HISTORICAL  06/05/2016   IR RADIOLOGIST EVAL & MGMT 06/05/2016 Aletta Edouard, MD [facility] INTERV RAD  . IVC Filter    . KIDNEY SURGERY     left -"laser surgery by Dr. Kathlene Cote- 5 yrs ago" no removal  . SCHLEROTHERAPY  05/24/2018   Procedure: Woodward Ku;  Surgeon: Jackquline Denmark, MD;  Location: [facility] ENDOSCOPY;  Service: Endoscopy;;  . TONSILLECTOMY    . TUBAL LIGATION      Family History  Problem Relation Age of Onset  . Bladder Cancer Father   . Diabetes Father   . Prostate cancer Father   . Alzheimer's disease Mother   . Diabetes Sister   . Lung cancer Sister         smoker  . Esophageal cancer Paternal Uncle   . Colon cancer Neg Hx     Social History  Socioeconomic History  . Marital status: Married    Spouse name: Not on file  . Number of children: 2  . Years of education: Not on file  . Highest education level: Not on file  Occupational History  . Occupation: Retired  Tobacco Use  . Smoking status: Never Smoker  . Smokeless tobacco: Never Used  Substance and Sexual Activity  . Alcohol use: No  . Drug use: No  . Sexual activity: Not Currently  Other Topics Concern  . Not on file  Social History Narrative   Married '61   1 son- '65, 1 daughter '63; 6 children (2 adopted)   SO- SOB   Retirement- doing well   Marriage in good health   Patient has never smoked   Alcohol use- no   Pt gets regular exercise         Social Determinants of Health   Financial Resource Strain:   . Difficulty of Paying Living Expenses:   Food Insecurity:   . Worried About Charity fundraiser in the Last Year:   . Arboriculturist in the Last Year:   Transportation Needs:   . Film/video editor (Medical):   Marland Kitchen Lack of Transportation  (Non-Medical):   Physical Activity:   . Days of Exercise per Week:   . Minutes of Exercise per Session:   Stress:   . Feeling of Stress :   Social Connections:   . Frequency of Communication with Friends and Family:   . Frequency of Social Gatherings with Friends and Family:   . Attends Religious Services:   . Active Member of Clubs or Organizations:   . Attends Archivist Meetings:   Marland Kitchen Marital Status:   Intimate Partner Violence:   . Fear of Current or Ex-Partner:   . Emotionally Abused:   Marland Kitchen Physically Abused:   . Sexually Abused:       Review of systems: All other review of systems negative except as mentioned in the HPI.   Physical Exam: Vitals:   01/25/20 1058  BP: (!) 100/50  Pulse: 80  Temp: 97.8 F (36.6 C)   Body mass index is 18.71 kg/m. Gen:      No acute distress Abd:      + bowel sounds; soft, non-tender; no palpable masses, no distension Neuro: alert and oriented x 3 Psych: normal mood and affect  Data Reviewed:  Reviewed labs, radiology imaging, old records and pertinent past GI work up   Assessment and Plan/Recommendations:  84 year old female with history of scleroderma /systemic sclerosis, interstitial lung disease/chronic parenchymal disease/pulmonary fibrosis, large pericardial effusion and pulmonary hypertension  Weight loss: Multifactorial, systemic sclerosis/ autoimmune disease likely playing a major role Encouraged patient to eat small frequent meals with high calorie high protein diet  Right lower quadrant discomfort: Chronic Likely secondary to right hip arthritis Recent imaging with CT abdomen pelvis unrevealing for any significant pathology  Given her age do not recommend recall colonoscopy for colorectal cancer screening, usually stop doing preventative screening colonoscopies after age 72 as potential risks outweigh benefits  GERD: Continue Protonix and antireflux measures  Chronic anemia: Anemia of chronic disease  and occult GI blood loss in the setting of small bowel AVM Hemoglobin remained stable.  Continue to monitor CBC  Return as needed   This visit required 40 minutes of patient care (this includes precharting, chart review, review of results, face-to-face time used for counseling as well as treatment plan and follow-up. The patient  was provided an opportunity to ask questions and all were answered. The patient agreed with the plan and demonstrated an understanding of the instructions.  Damaris Hippo , MD    CC: Hoyt Koch, *

## 2020-01-25 NOTE — Patient Instructions (Signed)
Remain on a high calorie/high protein diet.   Eat small frequent meals.   Follow up with Dr. Silverio Decamp as needed/

## 2020-02-22 ENCOUNTER — Other Ambulatory Visit (INDEPENDENT_AMBULATORY_CARE_PROVIDER_SITE_OTHER): Payer: Medicare PPO

## 2020-02-22 DIAGNOSIS — I272 Pulmonary hypertension, unspecified: Secondary | ICD-10-CM

## 2020-02-22 LAB — CBC
HCT: 28 % — ABNORMAL LOW (ref 36.0–46.0)
Hemoglobin: 9.7 g/dL — ABNORMAL LOW (ref 12.0–15.0)
MCHC: 34.5 g/dL (ref 30.0–36.0)
MCV: 113.3 fl — ABNORMAL HIGH (ref 78.0–100.0)
Platelets: 191 10*3/uL (ref 150.0–400.0)
RBC: 2.47 Mil/uL — ABNORMAL LOW (ref 3.87–5.11)
RDW: 13.9 % (ref 11.5–15.5)
WBC: 5.7 10*3/uL (ref 4.0–10.5)

## 2020-02-22 LAB — COMPREHENSIVE METABOLIC PANEL
ALT: 9 U/L (ref 0–35)
AST: 18 U/L (ref 0–37)
Albumin: 4.1 g/dL (ref 3.5–5.2)
Alkaline Phosphatase: 37 U/L — ABNORMAL LOW (ref 39–117)
BUN: 42 mg/dL — ABNORMAL HIGH (ref 6–23)
CO2: 27 mEq/L (ref 19–32)
Calcium: 9.1 mg/dL (ref 8.4–10.5)
Chloride: 105 mEq/L (ref 96–112)
Creatinine, Ser: 1.5 mg/dL — ABNORMAL HIGH (ref 0.40–1.20)
GFR: 33.29 mL/min — ABNORMAL LOW (ref >60.00)
Glucose, Bld: 85 mg/dL (ref 70–99)
Potassium: 4.2 mEq/L (ref 3.5–5.1)
Sodium: 139 mEq/L (ref 135–145)
Total Bilirubin: 0.5 mg/dL (ref 0.2–1.2)
Total Protein: 6.2 g/dL (ref 6.0–8.3)

## 2020-03-01 DIAGNOSIS — Z79899 Other long term (current) drug therapy: Secondary | ICD-10-CM | POA: Diagnosis not present

## 2020-03-01 DIAGNOSIS — I272 Pulmonary hypertension, unspecified: Secondary | ICD-10-CM | POA: Diagnosis not present

## 2020-03-01 DIAGNOSIS — R0600 Dyspnea, unspecified: Secondary | ICD-10-CM | POA: Diagnosis not present

## 2020-03-01 DIAGNOSIS — M349 Systemic sclerosis, unspecified: Secondary | ICD-10-CM | POA: Diagnosis not present

## 2020-03-01 DIAGNOSIS — I2721 Secondary pulmonary arterial hypertension: Secondary | ICD-10-CM | POA: Diagnosis not present

## 2020-03-01 DIAGNOSIS — D84821 Immunodeficiency due to drugs: Secondary | ICD-10-CM | POA: Diagnosis not present

## 2020-03-01 DIAGNOSIS — J849 Interstitial pulmonary disease, unspecified: Secondary | ICD-10-CM | POA: Diagnosis not present

## 2020-03-01 DIAGNOSIS — I129 Hypertensive chronic kidney disease with stage 1 through stage 4 chronic kidney disease, or unspecified chronic kidney disease: Secondary | ICD-10-CM | POA: Diagnosis not present

## 2020-03-01 DIAGNOSIS — N189 Chronic kidney disease, unspecified: Secondary | ICD-10-CM | POA: Diagnosis not present

## 2020-03-06 ENCOUNTER — Encounter: Payer: Self-pay | Admitting: Internal Medicine

## 2020-03-06 ENCOUNTER — Ambulatory Visit: Payer: Medicare PPO | Admitting: Internal Medicine

## 2020-03-06 ENCOUNTER — Other Ambulatory Visit: Payer: Self-pay

## 2020-03-06 VITALS — BP 118/56 | HR 72 | Temp 98.5°F | Ht 63.0 in | Wt 108.8 lb

## 2020-03-06 DIAGNOSIS — K219 Gastro-esophageal reflux disease without esophagitis: Secondary | ICD-10-CM | POA: Diagnosis not present

## 2020-03-06 DIAGNOSIS — D509 Iron deficiency anemia, unspecified: Secondary | ICD-10-CM | POA: Diagnosis not present

## 2020-03-06 DIAGNOSIS — M8949 Other hypertrophic osteoarthropathy, multiple sites: Secondary | ICD-10-CM

## 2020-03-06 DIAGNOSIS — M159 Polyosteoarthritis, unspecified: Secondary | ICD-10-CM

## 2020-03-06 MED ORDER — ALBUTEROL SULFATE HFA 108 (90 BASE) MCG/ACT IN AERS: INHALATION_SPRAY | RESPIRATORY_TRACT | 3 refills | Status: DC

## 2020-03-06 NOTE — Progress Notes (Signed)
Subjective:   Patient ID: Paula Ward, female    DOB: [birth year], 84 y.o.   MRN: [id number]  HPI The patient is an 84 YO female coming in for concerns about arthritis (pain in the right hand and right neck, denies trying anything other than otc creams, these do help but temporarily, decreased grip strength in the right hand) and swallowing (more problems with acid coming back up, denies missing medication, does not feel protonix is working well for her, has plenty of omeprazole at home she could switch to, has some sticking of food in the throat, denies pain in the throat) and iron deficiency (feels energy is not great, Hg levels stable at most recent but she feels better if they are 10 or greater, denies blood in stool, tries to eat iron in food).   Review of Systems  Constitutional: Positive for activity change.  HENT: Positive for trouble swallowing.   Eyes: Negative.   Respiratory: Positive for shortness of breath. Negative for cough and chest tightness.   Cardiovascular: Negative for chest pain, palpitations and leg swelling.  Gastrointestinal: Negative for abdominal distention, abdominal pain, constipation, diarrhea, nausea and vomiting.       Acid reflux  Musculoskeletal: Positive for arthralgias and myalgias.  Skin: Negative.        Veins both legs prominent and pain sometimes  Neurological: Negative.   Psychiatric/Behavioral: Negative.     Objective:  Physical Exam Constitutional:      Appearance: She is well-developed.     Comments: thin  HENT:     Head: Normocephalic and atraumatic.     Ears:     Comments: Dry mouth and tongue, redness at the back of the throat without drainage Cardiovascular:     Rate and Rhythm: Normal rate and regular rhythm.  Pulmonary:     Effort: Pulmonary effort is normal. No respiratory distress.     Breath sounds: Normal breath sounds. No wheezing or rales.  Abdominal:     General: Bowel sounds are normal. There is no distension.   Palpations: Abdomen is soft.     Tenderness: There is no abdominal tenderness. There is no rebound.  Musculoskeletal:     Cervical back: Normal range of motion.     Comments: Varicose and sclerosed veins on both legs bilaterally and feet  Skin:    General: Skin is warm and dry.  Neurological:     Mental Status: She is alert and oriented to person, place, and time.     Coordination: Coordination normal.     Vitals:   03/06/20 1516  BP: (!) 118/56  Pulse: 72  Temp: 98.5 F (36.9 C)  SpO2: 99%  Weight: 108 lb 12.8 oz (49.4 kg)  Height: 5' 3" (1.6 m)    This visit occurred during the SARS-CoV-2 public health emergency.  Safety protocols were in place, including screening questions prior to the visit, additional usage of staff PPE, and extensive cleaning of exam room while observing appropriate contact time as indicated for disinfecting solutions.   Assessment & Plan:

## 2020-03-06 NOTE — Assessment & Plan Note (Signed)
Appears to have worsening symptoms. Will switch to omeprazole 40 mg BID to see if this can help symptoms over protonix BID which she is currently taking.

## 2020-03-06 NOTE — Assessment & Plan Note (Signed)
Checking ferritin with next lab draw to assess if repeat iron deficiency is present.

## 2020-03-06 NOTE — Patient Instructions (Addendum)
You can go back to omeprazole twice a day.  You can try turmeric for the arthritis to see if this will help.  We will check the iron levels with the next labs and see if you need another iron infusion.  Neck Exercises Ask your health care provider which exercises are safe for you. Do exercises exactly as told by your health care provider and adjust them as directed. It is normal to feel mild stretching, pulling, tightness, or discomfort as you do these exercises. Stop right away if you feel sudden pain or your pain gets worse. Do not begin these exercises until told by your health care provider. Neck exercises can be important for many reasons. They can improve strength and maintain flexibility in your neck, which will help your upper back and prevent neck pain. Stretching exercises Rotation neck stretching  1. Sit in a chair or stand up. 2. Place your feet flat on the floor, shoulder width apart. 3. Slowly turn your head (rotate) to the right until a slight stretch is felt. Turn it all the way to the right so you can look over your right shoulder. Do not tilt or tip your head. 4. Hold this position for 10-30 seconds. 5. Slowly turn your head (rotate) to the left until a slight stretch is felt. Turn it all the way to the left so you can look over your left shoulder. Do not tilt or tip your head. 6. Hold this position for 10-30 seconds. Repeat __________ times. Complete this exercise __________ times a day. Neck retraction 1. Sit in a sturdy chair or stand up. 2. Look straight ahead. Do not bend your neck. 3. Use your fingers to push your chin backward (retraction). Do not bend your neck for this movement. Continue to face straight ahead. If you are doing the exercise properly, you will feel a slight sensation in your throat and a stretch at the back of your neck. 4. Hold the stretch for 1-2 seconds. Repeat __________ times. Complete this exercise __________ times a day. Strengthening  exercises Neck press 1. Lie on your back on a firm bed or on the floor with a pillow under your head. 2. Use your neck muscles to push your head down on the pillow and straighten your spine. 3. Hold the position as well as you can. Keep your head facing up (in a neutral position) and your chin tucked. 4. Slowly count to 5 while holding this position. Repeat __________ times. Complete this exercise __________ times a day. Isometrics These are exercises in which you strengthen the muscles in your neck while keeping your neck still (isometrics). 1. Sit in a supportive chair and place your hand on your forehead. 2. Keep your head and face facing straight ahead. Do not flex or extend your neck while doing isometrics. 3. Push forward with your head and neck while pushing back with your hand. Hold for 10 seconds. 4. Do the sequence again, this time putting your hand against the back of your head. Use your head and neck to push backward against the hand pressure. 5. Finally, do the same exercise on either side of your head, pushing sideways against the pressure of your hand. Repeat __________ times. Complete this exercise __________ times a day. Prone head lifts 1. Lie face-down (prone position), resting on your elbows so that your chest and upper back are raised. 2. Start with your head facing downward, near your chest. Position your chin either on or near your chest. 3.  Slowly lift your head upward. Lift until you are looking straight ahead. Then continue lifting your head as far back as you can comfortably stretch. 4. Hold your head up for 5 seconds. Then slowly lower it to your starting position. Repeat __________ times. Complete this exercise __________ times a day. Supine head lifts 1. Lie on your back (supine position), bending your knees to point to the ceiling and keeping your feet flat on the floor. 2. Lift your head slowly off the floor, raising your chin toward your chest. 3. Hold for 5  seconds. Repeat __________ times. Complete this exercise __________ times a day. Scapular retraction 1. Stand with your arms at your sides. Look straight ahead. 2. Slowly pull both shoulders (scapulae) backward and downward (retraction) until you feel a stretch between your shoulder blades in your upper back. 3. Hold for 10-30 seconds. 4. Relax and repeat. Repeat __________ times. Complete this exercise __________ times a day. Contact a health care provider if:  Your neck pain or discomfort gets much worse when you do an exercise.  Your neck pain or discomfort does not improve within 2 hours after you exercise. If you have any of these problems, stop exercising right away. Do not do the exercises again unless your health care provider says that you can. Get help right away if:  You develop sudden, severe neck pain. If this happens, stop exercising right away. Do not do the exercises again unless your health care provider says that you can. This information is not intended to replace advice given to you by your health care provider. Make sure you discuss any questions you have with your health care provider. Document Revised: 08/04/2018 Document Reviewed: 08/04/2018 Elsevier Patient Education  [place].

## 2020-03-06 NOTE — Assessment & Plan Note (Signed)
Advised turmeric and otc creams as safest option at this time. Given exercises for her neck also.

## 2020-03-12 ENCOUNTER — Other Ambulatory Visit (INDEPENDENT_AMBULATORY_CARE_PROVIDER_SITE_OTHER): Payer: Medicare PPO

## 2020-03-12 DIAGNOSIS — D509 Iron deficiency anemia, unspecified: Secondary | ICD-10-CM

## 2020-03-12 DIAGNOSIS — I272 Pulmonary hypertension, unspecified: Secondary | ICD-10-CM | POA: Diagnosis not present

## 2020-03-12 LAB — COMPREHENSIVE METABOLIC PANEL
ALT: 9 U/L (ref 0–35)
AST: 18 U/L (ref 0–37)
Albumin: 4.2 g/dL (ref 3.5–5.2)
Alkaline Phosphatase: 42 U/L (ref 39–117)
BUN: 26 mg/dL — ABNORMAL HIGH (ref 6–23)
CO2: 25 mEq/L (ref 19–32)
Calcium: 9.4 mg/dL (ref 8.4–10.5)
Chloride: 103 mEq/L (ref 96–112)
Creatinine, Ser: 1.43 mg/dL — ABNORMAL HIGH (ref 0.40–1.20)
GFR: 35.17 mL/min — ABNORMAL LOW (ref >60.00)
Glucose, Bld: 120 mg/dL — ABNORMAL HIGH (ref 70–99)
Potassium: 4.8 mEq/L (ref 3.5–5.1)
Sodium: 136 mEq/L (ref 135–145)
Total Bilirubin: 0.5 mg/dL (ref 0.2–1.2)
Total Protein: 6.4 g/dL (ref 6.0–8.3)

## 2020-03-12 LAB — CBC
HCT: 27.8 % — ABNORMAL LOW (ref 36.0–46.0)
Hemoglobin: 9.1 g/dL — ABNORMAL LOW (ref 12.0–15.0)
MCHC: 32.9 g/dL (ref 30.0–36.0)
MCV: 112.7 fl — ABNORMAL HIGH (ref 78.0–100.0)
Platelets: 241 10*3/uL (ref 150.0–400.0)
RBC: 2.46 Mil/uL — ABNORMAL LOW (ref 3.87–5.11)
RDW: 13.7 % (ref 11.5–15.5)
WBC: 4.7 10*3/uL (ref 4.0–10.5)

## 2020-03-12 LAB — FERRITIN: Ferritin: 193 ng/mL (ref 10.0–291.0)

## 2020-04-17 ENCOUNTER — Other Ambulatory Visit (INDEPENDENT_AMBULATORY_CARE_PROVIDER_SITE_OTHER): Payer: Medicare PPO

## 2020-04-17 DIAGNOSIS — I272 Pulmonary hypertension, unspecified: Secondary | ICD-10-CM | POA: Diagnosis not present

## 2020-04-17 LAB — COMPREHENSIVE METABOLIC PANEL
ALT: 8 U/L (ref 0–35)
AST: 17 U/L (ref 0–37)
Albumin: 4.3 g/dL (ref 3.5–5.2)
Alkaline Phosphatase: 43 U/L (ref 39–117)
BUN: 28 mg/dL — ABNORMAL HIGH (ref 6–23)
CO2: 28 mEq/L (ref 19–32)
Calcium: 9.4 mg/dL (ref 8.4–10.5)
Chloride: 100 mEq/L (ref 96–112)
Creatinine, Ser: 1.21 mg/dL — ABNORMAL HIGH (ref 0.40–1.20)
GFR: 42.64 mL/min — ABNORMAL LOW (ref >60.00)
Glucose, Bld: 91 mg/dL (ref 70–99)
Potassium: 4.2 mEq/L (ref 3.5–5.1)
Sodium: 135 mEq/L (ref 135–145)
Total Bilirubin: 0.5 mg/dL (ref 0.2–1.2)
Total Protein: 6.6 g/dL (ref 6.0–8.3)

## 2020-04-17 LAB — CBC
HCT: 27.6 % — ABNORMAL LOW (ref 36.0–46.0)
Hemoglobin: 9.3 g/dL — ABNORMAL LOW (ref 12.0–15.0)
MCHC: 33.8 g/dL (ref 30.0–36.0)
MCV: 106.5 fl — ABNORMAL HIGH (ref 78.0–100.0)
Platelets: 206 10*3/uL (ref 150.0–400.0)
RBC: 2.59 Mil/uL — ABNORMAL LOW (ref 3.87–5.11)
RDW: 13.3 % (ref 11.5–15.5)
WBC: 5 10*3/uL (ref 4.0–10.5)

## 2020-05-16 ENCOUNTER — Other Ambulatory Visit (INDEPENDENT_AMBULATORY_CARE_PROVIDER_SITE_OTHER): Payer: Medicare PPO

## 2020-05-16 DIAGNOSIS — I272 Pulmonary hypertension, unspecified: Secondary | ICD-10-CM

## 2020-05-16 LAB — COMPREHENSIVE METABOLIC PANEL
ALT: 9 U/L (ref 0–35)
AST: 19 U/L (ref 0–37)
Albumin: 4.1 g/dL (ref 3.5–5.2)
Alkaline Phosphatase: 43 U/L (ref 39–117)
BUN: 24 mg/dL — ABNORMAL HIGH (ref 6–23)
CO2: 27 mEq/L (ref 19–32)
Calcium: 9.3 mg/dL (ref 8.4–10.5)
Chloride: 100 mEq/L (ref 96–112)
Creatinine, Ser: 1.36 mg/dL — ABNORMAL HIGH (ref 0.40–1.20)
GFR: 37.25 mL/min — ABNORMAL LOW (ref >60.00)
Glucose, Bld: 93 mg/dL (ref 70–99)
Potassium: 4.5 mEq/L (ref 3.5–5.1)
Sodium: 133 mEq/L — ABNORMAL LOW (ref 135–145)
Total Bilirubin: 0.5 mg/dL (ref 0.2–1.2)
Total Protein: 6.4 g/dL (ref 6.0–8.3)

## 2020-05-16 LAB — CBC
HCT: 27.4 % — ABNORMAL LOW (ref 36.0–46.0)
Hemoglobin: 9.3 g/dL — ABNORMAL LOW (ref 12.0–15.0)
MCHC: 33.9 g/dL (ref 30.0–36.0)
MCV: 103.4 fl — ABNORMAL HIGH (ref 78.0–100.0)
Platelets: 214 10*3/uL (ref 150.0–400.0)
RBC: 2.64 Mil/uL — ABNORMAL LOW (ref 3.87–5.11)
RDW: 13.9 % (ref 11.5–15.5)
WBC: 5.2 10*3/uL (ref 4.0–10.5)

## 2020-05-24 ENCOUNTER — Ambulatory Visit: Payer: Medicare PPO | Admitting: Internal Medicine

## 2020-05-24 ENCOUNTER — Other Ambulatory Visit: Payer: Self-pay

## 2020-05-24 ENCOUNTER — Encounter: Payer: Self-pay | Admitting: Internal Medicine

## 2020-05-24 DIAGNOSIS — K219 Gastro-esophageal reflux disease without esophagitis: Secondary | ICD-10-CM | POA: Diagnosis not present

## 2020-05-24 MED ORDER — SUCRALFATE 1 GM/10ML PO SUSP: ORAL | 3 refills | Status: DC

## 2020-05-24 NOTE — Progress Notes (Signed)
   Subjective:   Patient ID: Paula Ward, female    DOB: May 11, 1936, 84 y.o.   MRN: [id number]  HPI The patient is an 84 YO female coming in for worsening GERD. She is taking omeprazole and pantoprazole in the morning which helps some. She is not taking anything else for reflux. Has had 2 episodes recently with food getting caught in her throat and needing to drink liquids to help it. With chronic hoarseness and some cough. Cough is not productive (previously was prior to pantoprazole). Denies blood in stool and recent CBC last week stable from prior. Denies nausea or vomiting. Denies change in eating overall. Is trying to gain weight and is not having much luck with this.   Review of Systems  Constitutional: Negative.   HENT: Positive for trouble swallowing.   Eyes: Negative.   Respiratory: Positive for cough and shortness of breath. Negative for chest tightness.   Cardiovascular: Negative for chest pain, palpitations and leg swelling.  Gastrointestinal: Negative for abdominal distention, abdominal pain, anal bleeding, blood in stool, constipation, diarrhea, nausea, rectal pain and vomiting.       Reflux  Musculoskeletal: Negative.   Skin: Negative.   Neurological: Negative.   Psychiatric/Behavioral: Negative.     Objective:  Physical Exam Constitutional:      Appearance: She is well-developed.  HENT:     Head: Normocephalic and atraumatic.  Cardiovascular:     Rate and Rhythm: Normal rate and regular rhythm.     Heart sounds: Murmur heard.      Comments: stable Pulmonary:     Effort: Pulmonary effort is normal. No respiratory distress.     Breath sounds: No wheezing or rales.     Comments: Crackles, stable exam Abdominal:     General: Bowel sounds are normal. There is no distension.     Palpations: Abdomen is soft.     Tenderness: There is no abdominal tenderness. There is no rebound.  Musculoskeletal:     Cervical back: Normal range of motion.  Skin:    General: Skin  is warm and dry.  Neurological:     Mental Status: She is alert and oriented to person, place, and time.     Coordination: Coordination normal.     Vitals:   05/24/20 1047  BP: 112/72  Pulse: 86  Temp: 97.6 F (36.4 C)  TempSrc: Oral  SpO2: 94%  Weight: 106 lb (48.1 kg)  Height: _0  (1.6 m)    This visit occurred during the SARS-CoV-2 public health emergency.  Safety protocols were in place, including screening questions prior to the visit, additional usage of staff PPE, and extensive cleaning of exam room while observing appropriate contact time as indicated for disinfecting solutions.   Assessment & Plan:

## 2020-05-24 NOTE — Patient Instructions (Signed)
We have sent in the liquid sucralfate to use 5 mL before meals and at bedtime to see if this helps with the throat pain and the cough.

## 2020-05-24 NOTE — Assessment & Plan Note (Signed)
Rx sucralfate and encouraged her to use this before meals and at bedtime to see if this helps with symptoms which are likely related to her GERD.

## 2020-06-12 ENCOUNTER — Other Ambulatory Visit (INDEPENDENT_AMBULATORY_CARE_PROVIDER_SITE_OTHER): Payer: Medicare PPO

## 2020-06-12 DIAGNOSIS — I272 Pulmonary hypertension, unspecified: Secondary | ICD-10-CM

## 2020-06-12 LAB — CBC
HCT: 28.4 % — ABNORMAL LOW (ref 36.0–46.0)
Hemoglobin: 9.5 g/dL — ABNORMAL LOW (ref 12.0–15.0)
MCHC: 33.4 g/dL (ref 30.0–36.0)
MCV: 104.6 fl — ABNORMAL HIGH (ref 78.0–100.0)
Platelets: 212 10*3/uL (ref 150.0–400.0)
RBC: 2.72 Mil/uL — ABNORMAL LOW (ref 3.87–5.11)
RDW: 14.2 % (ref 11.5–15.5)
WBC: 5.3 10*3/uL (ref 4.0–10.5)

## 2020-06-12 LAB — COMPREHENSIVE METABOLIC PANEL
ALT: 10 U/L (ref 0–35)
AST: 23 U/L (ref 0–37)
Albumin: 4.3 g/dL (ref 3.5–5.2)
Alkaline Phosphatase: 43 U/L (ref 39–117)
BUN: 31 mg/dL — ABNORMAL HIGH (ref 6–23)
CO2: 27 mEq/L (ref 19–32)
Calcium: 9.7 mg/dL (ref 8.4–10.5)
Chloride: 104 mEq/L (ref 96–112)
Creatinine, Ser: 1.29 mg/dL — ABNORMAL HIGH (ref 0.40–1.20)
GFR: 39.58 mL/min — ABNORMAL LOW (ref >60.00)
Glucose, Bld: 83 mg/dL (ref 70–99)
Potassium: 4.8 mEq/L (ref 3.5–5.1)
Sodium: 139 mEq/L (ref 135–145)
Total Bilirubin: 0.6 mg/dL (ref 0.2–1.2)
Total Protein: 6.7 g/dL (ref 6.0–8.3)

## 2020-06-15 DIAGNOSIS — H524 Presbyopia: Secondary | ICD-10-CM | POA: Diagnosis not present

## 2020-06-15 DIAGNOSIS — H40023 Open angle with borderline findings, high risk, bilateral: Secondary | ICD-10-CM | POA: Diagnosis not present

## 2020-06-15 DIAGNOSIS — Z961 Presence of intraocular lens: Secondary | ICD-10-CM | POA: Diagnosis not present

## 2020-07-04 ENCOUNTER — Telehealth: Payer: Self-pay | Admitting: Internal Medicine

## 2020-07-04 MED ORDER — MONTELUKAST SODIUM 10 MG PO TABS: ORAL_TABLET | ORAL | 5 refills | Status: DC

## 2020-07-04 NOTE — Telephone Encounter (Signed)
    1.Medication Requested:montelukast (SINGULAIR) 10 MG tablet  2. Pharmacy (Name, Street, [place]):[facility] [phone] - [place], [place]  3. On Med List: yes  4. Last Visit with PCP: 05/24/20  5. Next visit date with PCP: n/a   Agent: Please be advised that RX refills may take up to 3 business days. We ask that you follow-up with your pharmacy.

## 2020-07-16 ENCOUNTER — Other Ambulatory Visit (INDEPENDENT_AMBULATORY_CARE_PROVIDER_SITE_OTHER): Payer: Medicare PPO

## 2020-07-16 DIAGNOSIS — I272 Pulmonary hypertension, unspecified: Secondary | ICD-10-CM | POA: Diagnosis not present

## 2020-07-16 LAB — COMPREHENSIVE METABOLIC PANEL
ALT: 7 U/L (ref 0–35)
AST: 15 U/L (ref 0–37)
Albumin: 4.1 g/dL (ref 3.5–5.2)
Alkaline Phosphatase: 35 U/L — ABNORMAL LOW (ref 39–117)
BUN: 24 mg/dL — ABNORMAL HIGH (ref 6–23)
CO2: 28 mEq/L (ref 19–32)
Calcium: 9.3 mg/dL (ref 8.4–10.5)
Chloride: 101 mEq/L (ref 96–112)
Creatinine, Ser: 1.3 mg/dL — ABNORMAL HIGH (ref 0.40–1.20)
GFR: 39.22 mL/min — ABNORMAL LOW (ref >60.00)
Glucose, Bld: 82 mg/dL (ref 70–99)
Potassium: 4.3 mEq/L (ref 3.5–5.1)
Sodium: 138 mEq/L (ref 135–145)
Total Bilirubin: 0.5 mg/dL (ref 0.2–1.2)
Total Protein: 6.2 g/dL (ref 6.0–8.3)

## 2020-07-16 LAB — CBC
HCT: 27.4 % — ABNORMAL LOW (ref 36.0–46.0)
Hemoglobin: 9.1 g/dL — ABNORMAL LOW (ref 12.0–15.0)
MCHC: 33.2 g/dL (ref 30.0–36.0)
MCV: 104.5 fl — ABNORMAL HIGH (ref 78.0–100.0)
Platelets: 210 10*3/uL (ref 150.0–400.0)
RBC: 2.62 Mil/uL — ABNORMAL LOW (ref 3.87–5.11)
RDW: 14.5 % (ref 11.5–15.5)
WBC: 4.7 10*3/uL (ref 4.0–10.5)

## 2020-07-17 ENCOUNTER — Encounter: Payer: Self-pay | Admitting: Internal Medicine

## 2020-07-24 ENCOUNTER — Ambulatory Visit (INDEPENDENT_AMBULATORY_CARE_PROVIDER_SITE_OTHER): Payer: Medicare PPO | Admitting: Internal Medicine

## 2020-07-24 ENCOUNTER — Encounter: Payer: Self-pay | Admitting: Internal Medicine

## 2020-07-24 ENCOUNTER — Other Ambulatory Visit: Payer: Self-pay

## 2020-07-24 VITALS — BP 110/62 | HR 73 | Temp 98.0°F | Ht 63.0 in | Wt 107.0 lb

## 2020-07-24 DIAGNOSIS — R490 Dysphonia: Secondary | ICD-10-CM | POA: Diagnosis not present

## 2020-07-24 DIAGNOSIS — Z23 Encounter for immunization: Secondary | ICD-10-CM | POA: Diagnosis not present

## 2020-07-24 DIAGNOSIS — K219 Gastro-esophageal reflux disease without esophagitis: Secondary | ICD-10-CM | POA: Diagnosis not present

## 2020-07-24 NOTE — Assessment & Plan Note (Signed)
Chronic problem Per ENT related to GERD Taking a PPI BID, following with GI She feels her GERD is well controlled ? Why hoarse - no evidence of a sinus infection ? GI really controlled or not Advised f/u with ENT

## 2020-07-24 NOTE — Patient Instructions (Addendum)
Continue your current medication.     Consider seeing ENT again.

## 2020-07-24 NOTE — Progress Notes (Signed)
Subjective:    Patient ID: Paula Ward, female    DOB: [birth year], 84 y.o.   MRN: [id number]  HPI The patient is here for an acute visit.  Acid reflux, raspy voice:  She went to the ENT earlier this year and was told it was reflux.  She has also seen GI.  She takes pantoprazole once a day and omeprazole once a day.    She worries about her sinuses.  During the night she may cough and it is a crackle type cough.     She also has pulm htn, scleroderma.   She has SOB.  She is on prednisone - initially was on 10 mg - did not tolerate it.  Now on 2/5 mg daily and it did help her breathing.  [facility] rheum advised her to increase her prednisone to 5 mg      Medications and allergies reviewed with patient and updated if appropriate.  Patient Active Problem List   Diagnosis Date Noted  . Drowsiness 12/17/2018  . Leg swelling 09/03/2018  . Orthopnea 06/24/2018  . Dry mouth 02/15/2018  . Allergic rhinitis 11/05/2017  . Cerebellar 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 ([place]) 10/26/2017  . Routine general medical examination at a health care facility 01/30/2017  . Pericardial effusion 01/21/2016  . 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HCC)   . Osteoarthritis 02/25/2015  . Mass of left side of neck   . CKD (chronic kidney disease) stage 3, GFR 30-59 ml/min (HCC) 09/01/2012  . RECTAL INCONTINENCE 03/27/2009  . Angiodysplasia of stomach and duodenum 08/22/2008  . ADENOCARCINOMA, BREAST, LEFT 07/26/2007  . Anemia, iron deficiency 07/26/2007  . Essential hypertension 07/26/2007  . Pulmonary hypertension ([place]) 07/26/2007  . GASTROESOPHAGEAL REFLUX DISEASE 07/26/2007  . SCLERODERMA 07/26/2007  . ILD (interstitial lung disease) ([place]) 07/26/2007  . PULMONARY EMBOLISM, HX OF 07/26/2007    Current Outpatient Medications on File Prior to Visit  Medication Sig Dispense Refill  . ACCU-CHEK AVIVA PLUS test strip     . acetaminophen (TYLENOL) 325 MG tablet Take 650 mg by mouth every 6 (six) hours  as needed for mild pain.     Marland Kitchen albuterol (VENTOLIN HFA) 108 (90 Base) MCG/ACT inhaler INHALE 2 PUFFS INTO THE LUNGS EVERY 6 HOURS AS NEEDED FOR WHEEZING OR SHORTNESS OF BREATH 6.7 g 3  . ALPRAZolam (XANAX) 0.25 MG tablet Take 0.5 tablets (0.125 mg total) by mouth at bedtime as needed. 30 tablet 3  . atovaquone (MEPRON) 750 MG/5ML suspension     . azaTHIOprine (IMURAN) 50 MG tablet Take 2 tablets by mouth daily.    . bacitracin ointment Apply 1 application topically 2 (two) times daily. 120 g 0  . diltiazem (CARDIZEM CD) 120 MG 24 hr capsule Take 120 mg by mouth daily.      . fluticasone (FLONASE) 50 MCG/ACT nasal spray INSTILL 1 SPRAY INTO EACH NOSTRIL TWICE A DAY IF NEEDED 48 g 3  . lidocaine (XYLOCAINE) 2 % solution Use as directed 5 mLs in the mouth or throat every 3 (three) hours as needed for mouth pain. 100 mL 3  . meclizine (ANTIVERT) 12.5 MG tablet Take 1-2 tablets (12.5-25 mg total) by mouth 3 (three) times daily as needed for dizziness. 30 tablet 1  . montelukast (SINGULAIR) 10 MG tablet TAKE 1 TABLET(10 MG) BY MOUTH AT BEDTIME 30 tablet 5  . Mouthwashes (DRY MOUTH MOUTHWASH) LIQD Use as directed 5 mLs in the mouth or throat as needed (dry mouth). 473 mL  11  . Multiple Vitamin (MULTIVITAMIN WITH MINERALS) TABS tablet Take 1 tablet by mouth daily. NO IRON    . mupirocin cream (BACTROBAN) 2 % Apply 1 application topically 2 (two) times daily. 30 g 0  . OPSUMIT 10 MG TABS Take 1 tablet by mouth daily.     . pantoprazole (PROTONIX) 40 MG tablet Take 1 tablet (40 mg total) by mouth 2 (two) times daily. 180 tablet 11  . pilocarpine (SALAGEN) 5 MG tablet TAKE 1 TABLET(5 MG) BY MOUTH THREE TIMES DAILY AS NEEDED 270 tablet 1  . predniSONE (DELTASONE) 2.5 MG tablet Take 2.5 mg by mouth daily with breakfast.    . spironolactone (ALDACTONE) 25 MG tablet     . sucralfate (CARAFATE) 1 GM/10ML suspension TAKE 10 MLS BY MOUTH AT BEDTIME 900 mL 3   No current facility-administered medications on file  prior to visit.    Past Medical History:  Diagnosis Date  . Anemia   . Angiodysplasia of stomach and duodenum   . Arthus phenomenon   . AVM (arteriovenous malformation)   . Blood transfusion without reported diagnosis    last 4 units 12-22-15, Iron infusion x2 last -01-07-16,01-14-16.  . Breast cancer ([place]) 1989   Left  . Candida esophagitis ([place])   . Cataract   . Chronic kidney disease    Chronic mild renal insuffiency  . CREST syndrome ([place])   . GERD (gastroesophageal reflux disease)    w/ HH  . Interstitial lung disease ([place])   . Nodule of kidney   . PONV (postoperative nausea and vomiting)   . Pulmonary embolus ([place]) 2003  . Pulmonary hypertension ([place])    followed by Dr Gaynell Face at [facility], now Dr. Maryjean Ka visit end 2'17.  . Rectal incontinence   . Renal cell carcinoma ([place])   . Scleroderma ([place])   . Tubular adenoma of colon   . Uterine polyp     Past Surgical History:  Procedure Laterality Date  . APPENDECTOMY  1962  . BREAST LUMPECTOMY  1989   left  . CATARACT EXTRACTION, BILATERAL Bilateral 12/2013  . ENTEROSCOPY N/A 01/18/2016   Procedure: ENTEROSCOPY;  Surgeon: Mauri Pole, MD;  Location: [facility]COPY;  Service: Endoscopy;  Laterality: N/A;  . ENTEROSCOPY N/A 05/24/2018   Procedure: ENTEROSCOPY;  Surgeon: Jackquline Denmark, MD;  Location: [facility];  Service: Endoscopy;  Laterality: N/A;  . ESOPHAGOGASTRODUODENOSCOPY (EGD) WITH PROPOFOL N/A 12/21/2015   Procedure: ESOPHAGOGASTRODUODENOSCOPY (EGD) WITH PROPOFOL;  Surgeon: Irene Shipper, MD;  Location: [facility];  Service: Endoscopy;  Laterality: N/A;  . HOT HEMOSTASIS N/A 05/24/2018   Procedure: HOT HEMOSTASIS (ARGON PLASMA COAGULATION/BICAP);  Surgeon: Jackquline Denmark, MD;  Location: [facility];  Service: Endoscopy;  Laterality: N/A;  . IR GENERIC HISTORICAL  06/05/2016   IR RADIOLOGIST EVAL & MGMT 06/05/2016 Aletta Edouard, MD [facility] INTERV RAD  . IVC Filter    . KIDNEY SURGERY     left -"laser surgery by  Dr. Kathlene Cote- 5 yrs ago" no removal  . SCHLEROTHERAPY  05/24/2018   Procedure: Woodward Ku;  Surgeon: Jackquline Denmark, MD;  Location: [facility];  Service: Endoscopy;;  . TONSILLECTOMY    . TUBAL LIGATION      Social History   Socioeconomic History  . Marital status: Married    Spouse name: Not on file  . Number of children: 2  . Years of education: Not on file  . Highest education level: Not on file  Occupational History  . Occupation: Retired  Tobacco  Use  . Smoking status: Never Smoker  . Smokeless tobacco: Never Used  Vaping Use  . Vaping Use: Never used  Substance and Sexual Activity  . Alcohol use: No  . Drug use: No  . Sexual activity: Not Currently  Other Topics Concern  . Not on file  Social History Narrative   Married '61   1 son- '65, 1 daughter '63; 6 children (2 adopted)   SO- SOB   Retirement- doing well   Marriage in good health   Patient has never smoked   Alcohol use- no   Pt gets regular exercise         Social Determinants of Health   Financial Resource Strain:   . Difficulty of Paying Living Expenses: Not on file  Food Insecurity:   . Worried About Charity fundraiser in the Last Year: Not on file  . Ran Out of Food in the Last Year: Not on file  Transportation Needs:   . Lack of Transportation (Medical): Not on file  . Lack of Transportation (Non-Medical): Not on file  Physical Activity:   . Days of Exercise per Week: Not on file  . Minutes of Exercise per Session: Not on file  Stress:   . Feeling of Stress : Not on file  Social Connections:   . Frequency of Communication with Friends and Family: Not on file  . Frequency of Social Gatherings with Friends and Family: Not on file  . Attends Religious Services: Not on file  . Active Member of Clubs or Organizations: Not on file  . Attends Archivist Meetings: Not on file  . Marital Status: Not on file    Family History  Problem Relation Age of Onset  . Bladder Cancer  Father   . Diabetes Father   . Prostate cancer Father   . Alzheimer's disease Mother   . Diabetes Sister   . Lung cancer Sister         smoker  . Esophageal cancer Paternal Uncle   . Colon cancer Neg Hx     Review of Systems  Constitutional: Negative for chills and fever.  HENT: Positive for postnasal drip and voice change. Negative for congestion, ear pain, sinus pressure, sinus pain, sore throat and trouble swallowing.   Neurological: Positive for dizziness (occ). Negative for headaches.       Objective:   Vitals:   07/24/20 1357  BP: 110/62  Pulse: 73  Temp: 98 F (36.7 C)  SpO2: 95%   BP Readings from Last 3 Encounters:  07/24/20 110/62  05/24/20 112/72  03/06/20 (!) 118/56   Wt Readings from Last 3 Encounters:  07/24/20 107 lb (48.5 kg)  05/24/20 106 lb (48.1 kg)  03/06/20 108 lb 12.8 oz (49.4 kg)   Body mass index is 18.95 kg/m.   Physical Exam    GENERAL APPEARANCE: Appears stated age, well appearing, NAD EYES: conjunctiva clear, no icterus HEENT: bilateral tympanic membranes and ear canals normal, oropharynx with no erythema, no thyromegaly, trachea midline, no cervical or supraclavicular lymphadenopathy LUNGS:  unlabored breathing, good air entry bilaterally, no wheeze, bibasilar dry crackles. CARDIOVASCULAR: Normal S1,S2 2/6 sys murmur, no edema SKIN: Warm, dry      Assessment & Plan:    See Problem List for Assessment and Plan of chronic medical problems.    This visit occurred during the SARS-CoV-2 public health emergency.  Safety protocols were in place, including screening questions prior to the visit, additional usage of staff  PPE, and extensive cleaning of exam room while observing appropriate contact time as indicated for disinfecting solutions.

## 2020-07-25 NOTE — Addendum Note (Signed)
Addended by: Marcina Millard on: 07/25/2020 08:06 AM   Modules accepted: Orders

## 2020-08-03 DIAGNOSIS — D2272 Melanocytic nevi of left lower limb, including hip: Secondary | ICD-10-CM | POA: Diagnosis not present

## 2020-08-03 DIAGNOSIS — B351 Tinea unguium: Secondary | ICD-10-CM | POA: Diagnosis not present

## 2020-08-03 DIAGNOSIS — D2271 Melanocytic nevi of right lower limb, including hip: Secondary | ICD-10-CM | POA: Diagnosis not present

## 2020-08-03 DIAGNOSIS — I788 Other diseases of capillaries: Secondary | ICD-10-CM | POA: Diagnosis not present

## 2020-08-03 DIAGNOSIS — D225 Melanocytic nevi of trunk: Secondary | ICD-10-CM | POA: Diagnosis not present

## 2020-08-03 DIAGNOSIS — D1723 Benign lipomatous neoplasm of skin and subcutaneous tissue of right leg: Secondary | ICD-10-CM | POA: Diagnosis not present

## 2020-08-03 DIAGNOSIS — L821 Other seborrheic keratosis: Secondary | ICD-10-CM | POA: Diagnosis not present

## 2020-08-03 DIAGNOSIS — L853 Xerosis cutis: Secondary | ICD-10-CM | POA: Diagnosis not present

## 2020-08-11 ENCOUNTER — Other Ambulatory Visit: Payer: Self-pay | Admitting: Gastroenterology

## 2020-08-15 ENCOUNTER — Other Ambulatory Visit (INDEPENDENT_AMBULATORY_CARE_PROVIDER_SITE_OTHER): Payer: Medicare PPO

## 2020-08-15 DIAGNOSIS — I272 Pulmonary hypertension, unspecified: Secondary | ICD-10-CM | POA: Diagnosis not present

## 2020-08-15 LAB — COMPREHENSIVE METABOLIC PANEL
ALT: 10 U/L (ref 0–35)
AST: 18 U/L (ref 0–37)
Albumin: 4.5 g/dL (ref 3.5–5.2)
Alkaline Phosphatase: 44 U/L (ref 39–117)
BUN: 31 mg/dL — ABNORMAL HIGH (ref 6–23)
CO2: 29 mEq/L (ref 19–32)
Calcium: 9.3 mg/dL (ref 8.4–10.5)
Chloride: 100 mEq/L (ref 96–112)
Creatinine, Ser: 1.57 mg/dL — ABNORMAL HIGH (ref 0.40–1.20)
GFR: 30.65 mL/min — ABNORMAL LOW (ref >60.00)
Glucose, Bld: 139 mg/dL — ABNORMAL HIGH (ref 70–99)
Potassium: 5 mEq/L (ref 3.5–5.1)
Sodium: 137 mEq/L (ref 135–145)
Total Bilirubin: 0.5 mg/dL (ref 0.2–1.2)
Total Protein: 6.7 g/dL (ref 6.0–8.3)

## 2020-08-15 LAB — CBC
HCT: 30 % — ABNORMAL LOW (ref 36.0–46.0)
Hemoglobin: 10.2 g/dL — ABNORMAL LOW (ref 12.0–15.0)
MCHC: 34.1 g/dL (ref 30.0–36.0)
MCV: 103.8 fl — ABNORMAL HIGH (ref 78.0–100.0)
Platelets: 250 10*3/uL (ref 150.0–400.0)
RBC: 2.89 Mil/uL — ABNORMAL LOW (ref 3.87–5.11)
RDW: 14.5 % (ref 11.5–15.5)
WBC: 7.3 10*3/uL (ref 4.0–10.5)

## 2020-08-23 ENCOUNTER — Telehealth: Payer: Self-pay

## 2020-08-23 NOTE — Telephone Encounter (Signed)
Please inform the patient that all labs are stable, except for her kidney function that is slightly worse. The patient needs to hydrate herself well. Schedule an appointment to see Dr. Sharlet Salina in 4 to 6 weeks.

## 2020-08-28 DIAGNOSIS — J383 Other diseases of vocal cords: Secondary | ICD-10-CM | POA: Diagnosis not present

## 2020-08-28 DIAGNOSIS — K219 Gastro-esophageal reflux disease without esophagitis: Secondary | ICD-10-CM | POA: Diagnosis not present

## 2020-09-06 DIAGNOSIS — K219 Gastro-esophageal reflux disease without esophagitis: Secondary | ICD-10-CM | POA: Diagnosis not present

## 2020-09-06 DIAGNOSIS — I2721 Secondary pulmonary arterial hypertension: Secondary | ICD-10-CM | POA: Diagnosis not present

## 2020-09-06 DIAGNOSIS — Z79899 Other long term (current) drug therapy: Secondary | ICD-10-CM | POA: Diagnosis not present

## 2020-09-06 DIAGNOSIS — R0600 Dyspnea, unspecified: Secondary | ICD-10-CM | POA: Diagnosis not present

## 2020-09-06 DIAGNOSIS — R0609 Other forms of dyspnea: Secondary | ICD-10-CM | POA: Diagnosis not present

## 2020-09-06 DIAGNOSIS — M349 Systemic sclerosis, unspecified: Secondary | ICD-10-CM | POA: Diagnosis not present

## 2020-09-06 DIAGNOSIS — J849 Interstitial pulmonary disease, unspecified: Secondary | ICD-10-CM | POA: Diagnosis not present

## 2020-09-06 DIAGNOSIS — K922 Gastrointestinal hemorrhage, unspecified: Secondary | ICD-10-CM | POA: Diagnosis not present

## 2020-09-06 DIAGNOSIS — Z853 Personal history of malignant neoplasm of breast: Secondary | ICD-10-CM | POA: Diagnosis not present

## 2020-09-06 DIAGNOSIS — Z7952 Long term (current) use of systemic steroids: Secondary | ICD-10-CM | POA: Diagnosis not present

## 2020-09-06 DIAGNOSIS — M3481 Systemic sclerosis with lung involvement: Secondary | ICD-10-CM | POA: Diagnosis not present

## 2020-09-06 DIAGNOSIS — R490 Dysphonia: Secondary | ICD-10-CM | POA: Diagnosis not present

## 2020-09-14 ENCOUNTER — Other Ambulatory Visit: Payer: Self-pay | Admitting: Gastroenterology

## 2020-09-14 ENCOUNTER — Other Ambulatory Visit: Payer: Self-pay | Admitting: Internal Medicine

## 2020-09-17 ENCOUNTER — Other Ambulatory Visit (INDEPENDENT_AMBULATORY_CARE_PROVIDER_SITE_OTHER): Payer: Medicare PPO

## 2020-09-17 DIAGNOSIS — I272 Pulmonary hypertension, unspecified: Secondary | ICD-10-CM

## 2020-09-17 LAB — CBC
HCT: 28.4 % — ABNORMAL LOW (ref 36.0–46.0)
Hemoglobin: 9.5 g/dL — ABNORMAL LOW (ref 12.0–15.0)
MCHC: 33.3 g/dL (ref 30.0–36.0)
MCV: 103.7 fl — ABNORMAL HIGH (ref 78.0–100.0)
Platelets: 226 10*3/uL (ref 150.0–400.0)
RBC: 2.74 Mil/uL — ABNORMAL LOW (ref 3.87–5.11)
RDW: 14.3 % (ref 11.5–15.5)
WBC: 5.4 10*3/uL (ref 4.0–10.5)

## 2020-09-17 LAB — COMPREHENSIVE METABOLIC PANEL
ALT: 10 U/L (ref 0–35)
AST: 18 U/L (ref 0–37)
Albumin: 4.1 g/dL (ref 3.5–5.2)
Alkaline Phosphatase: 36 U/L — ABNORMAL LOW (ref 39–117)
BUN: 30 mg/dL — ABNORMAL HIGH (ref 6–23)
CO2: 27 mEq/L (ref 19–32)
Calcium: 9.2 mg/dL (ref 8.4–10.5)
Chloride: 104 mEq/L (ref 96–112)
Creatinine, Ser: 1.11 mg/dL (ref 0.40–1.20)
GFR: 46.43 mL/min — ABNORMAL LOW (ref >60.00)
Glucose, Bld: 79 mg/dL (ref 70–99)
Potassium: 3.8 mEq/L (ref 3.5–5.1)
Sodium: 139 mEq/L (ref 135–145)
Total Bilirubin: 0.6 mg/dL (ref 0.2–1.2)
Total Protein: 6.5 g/dL (ref 6.0–8.3)

## 2020-10-16 ENCOUNTER — Other Ambulatory Visit (INDEPENDENT_AMBULATORY_CARE_PROVIDER_SITE_OTHER): Payer: Medicare PPO

## 2020-10-16 DIAGNOSIS — I272 Pulmonary hypertension, unspecified: Secondary | ICD-10-CM

## 2020-10-16 LAB — COMPREHENSIVE METABOLIC PANEL
ALT: 9 U/L (ref 0–35)
AST: 16 U/L (ref 0–37)
Albumin: 4 g/dL (ref 3.5–5.2)
Alkaline Phosphatase: 36 U/L — ABNORMAL LOW (ref 39–117)
BUN: 30 mg/dL — ABNORMAL HIGH (ref 6–23)
CO2: 28 mEq/L (ref 19–32)
Calcium: 9.1 mg/dL (ref 8.4–10.5)
Chloride: 105 mEq/L (ref 96–112)
Creatinine, Ser: 1.21 mg/dL — ABNORMAL HIGH (ref 0.40–1.20)
GFR: 41.84 mL/min — ABNORMAL LOW (ref >60.00)
Glucose, Bld: 86 mg/dL (ref 70–99)
Potassium: 4.2 mEq/L (ref 3.5–5.1)
Sodium: 139 mEq/L (ref 135–145)
Total Bilirubin: 0.5 mg/dL (ref 0.2–1.2)
Total Protein: 6.4 g/dL (ref 6.0–8.3)

## 2020-10-16 LAB — CBC
HCT: 26.3 % — ABNORMAL LOW (ref 36.0–46.0)
Hemoglobin: 8.9 g/dL — ABNORMAL LOW (ref 12.0–15.0)
MCHC: 33.9 g/dL (ref 30.0–36.0)
MCV: 103.2 fl — ABNORMAL HIGH (ref 78.0–100.0)
Platelets: 246 10*3/uL (ref 150.0–400.0)
RBC: 2.55 Mil/uL — ABNORMAL LOW (ref 3.87–5.11)
RDW: 14.2 % (ref 11.5–15.5)
WBC: 6.4 10*3/uL (ref 4.0–10.5)

## 2020-10-17 ENCOUNTER — Encounter: Payer: Self-pay | Admitting: Internal Medicine

## 2020-10-23 ENCOUNTER — Telehealth (INDEPENDENT_AMBULATORY_CARE_PROVIDER_SITE_OTHER): Payer: Medicare PPO | Admitting: Internal Medicine

## 2020-10-23 ENCOUNTER — Encounter: Payer: Self-pay | Admitting: Internal Medicine

## 2020-10-23 DIAGNOSIS — E538 Deficiency of other specified B group vitamins: Secondary | ICD-10-CM

## 2020-10-23 DIAGNOSIS — D500 Iron deficiency anemia secondary to blood loss (chronic): Secondary | ICD-10-CM

## 2020-10-23 DIAGNOSIS — E559 Vitamin D deficiency, unspecified: Secondary | ICD-10-CM | POA: Diagnosis not present

## 2020-10-23 NOTE — Progress Notes (Signed)
Virtual Visit via Video Note  I connected with Paula Ward on 10/23/20 at  9:40 AM EST by a video enabled telemedicine application and verified that I am speaking with the correct person using two identifiers.  The patient and the provider were at separate locations throughout the entire encounter. Patient location: home, Provider location: work   I discussed the limitations of evaluation and management by telemedicine and the availability of in person appointments. The patient expressed understanding and agreed to proceed. The patient and the provider were the only parties present for the visit unless noted in HPI below.  History of Present Illness: The patient is a 85 y.o. female with visit for fatigue and low blood counts. Started with illness and diarrhea about 1 week ago which lasted for 2-3 days. Has been eating and drinking normally. Did have some gluten which could have bothered her stomach. Denies current symptoms. Just overall fatigue which is worsening gradually. Feels like when she had low iron levels in the past. Overall it is stable to mild worsening. Has tried nothing.  Observations/Objective: Appearance: normal, breathing appears normal, no coughing or dyspnea during visit with sitting, speaking in full sentences, casual grooming, abdomen does not appear distended, throat not well visualized, memory normal, mental status is A and O times 3  Assessment and Plan: See problem oriented charting  Follow Up Instructions: checking iron studies and B12 and vitamin D, treat as appropriate  I discussed the assessment and treatment plan with the patient. The patient was provided an opportunity to ask questions and all were answered. The patient agreed with the plan and demonstrated an understanding of the instructions.   The patient was advised to call back or seek an in-person evaluation if the symptoms worsen or if the condition fails to improve as anticipated.  Hoyt Koch,  MD

## 2020-10-23 NOTE — Assessment & Plan Note (Addendum)
Chronic GI loss due to angiodysplasia. Checking iron studies, B12 and vitamin D. Treat as appropriate. Hg has been trending downwards and last 8.9 which does not meet criteria for rbc transfusion.

## 2020-10-24 ENCOUNTER — Encounter: Payer: Self-pay | Admitting: Internal Medicine

## 2020-10-29 MED ORDER — ALPRAZOLAM 0.25 MG PO TABS: 0.1250 mg | ORAL_TABLET | Freq: Every evening | ORAL | 3 refills | Status: DC | PRN

## 2020-10-31 ENCOUNTER — Other Ambulatory Visit: Payer: Self-pay

## 2020-10-31 ENCOUNTER — Other Ambulatory Visit (INDEPENDENT_AMBULATORY_CARE_PROVIDER_SITE_OTHER): Payer: Medicare PPO

## 2020-10-31 DIAGNOSIS — E559 Vitamin D deficiency, unspecified: Secondary | ICD-10-CM | POA: Diagnosis not present

## 2020-10-31 DIAGNOSIS — D500 Iron deficiency anemia secondary to blood loss (chronic): Secondary | ICD-10-CM

## 2020-10-31 DIAGNOSIS — E538 Deficiency of other specified B group vitamins: Secondary | ICD-10-CM | POA: Diagnosis not present

## 2020-10-31 DIAGNOSIS — Z1231 Encounter for screening mammogram for malignant neoplasm of breast: Secondary | ICD-10-CM | POA: Diagnosis not present

## 2020-10-31 LAB — VITAMIN D 25 HYDROXY (VIT D DEFICIENCY, FRACTURES): VITD: 93.72 ng/mL (ref 30.00–100.00)

## 2020-10-31 LAB — VITAMIN B12: Vitamin B-12: 606 pg/mL (ref 211–911)

## 2020-11-01 LAB — IRON,TIBC AND FERRITIN PANEL
%SAT: 22 % (calc) (ref 16–45)
Ferritin: 67 ng/mL (ref 16–288)
Iron: 60 ug/dL (ref 45–160)
TIBC: 271 mcg/dL (calc) (ref 250–450)

## 2020-11-04 ENCOUNTER — Encounter: Payer: Self-pay | Admitting: Internal Medicine

## 2020-11-06 ENCOUNTER — Other Ambulatory Visit: Payer: Self-pay | Admitting: Internal Medicine

## 2020-11-06 DIAGNOSIS — D500 Iron deficiency anemia secondary to blood loss (chronic): Secondary | ICD-10-CM

## 2020-11-13 ENCOUNTER — Other Ambulatory Visit (INDEPENDENT_AMBULATORY_CARE_PROVIDER_SITE_OTHER): Payer: Medicare PPO

## 2020-11-13 DIAGNOSIS — I272 Pulmonary hypertension, unspecified: Secondary | ICD-10-CM | POA: Diagnosis not present

## 2020-11-13 LAB — COMPREHENSIVE METABOLIC PANEL
ALT: 8 U/L (ref 0–35)
AST: 17 U/L (ref 0–37)
Albumin: 4.3 g/dL (ref 3.5–5.2)
Alkaline Phosphatase: 38 U/L — ABNORMAL LOW (ref 39–117)
BUN: 29 mg/dL — ABNORMAL HIGH (ref 6–23)
CO2: 28 mEq/L (ref 19–32)
Calcium: 9.4 mg/dL (ref 8.4–10.5)
Chloride: 102 mEq/L (ref 96–112)
Creatinine, Ser: 1.3 mg/dL — ABNORMAL HIGH (ref 0.40–1.20)
GFR: 38.37 mL/min — ABNORMAL LOW (ref >60.00)
Glucose, Bld: 101 mg/dL — ABNORMAL HIGH (ref 70–99)
Potassium: 4.3 mEq/L (ref 3.5–5.1)
Sodium: 136 mEq/L (ref 135–145)
Total Bilirubin: 0.5 mg/dL (ref 0.2–1.2)
Total Protein: 6.6 g/dL (ref 6.0–8.3)

## 2020-11-13 LAB — CBC
HCT: 26.1 % — ABNORMAL LOW (ref 36.0–46.0)
Hemoglobin: 8.6 g/dL — ABNORMAL LOW (ref 12.0–15.0)
MCHC: 33 g/dL (ref 30.0–36.0)
MCV: 104.6 fl — ABNORMAL HIGH (ref 78.0–100.0)
Platelets: 235 10*3/uL (ref 150.0–400.0)
RBC: 2.5 Mil/uL — ABNORMAL LOW (ref 3.87–5.11)
RDW: 14.8 % (ref 11.5–15.5)
WBC: 6 10*3/uL (ref 4.0–10.5)

## 2020-11-14 ENCOUNTER — Telehealth: Payer: Self-pay | Admitting: Gastroenterology

## 2020-11-14 NOTE — Telephone Encounter (Signed)
Called patient back and let her know it was her PCP that ordered the CBC yesterday, and it would be up to her how she wanted to treat the results. She said she would call her.

## 2020-11-14 NOTE — Telephone Encounter (Signed)
Patient called states her hemoglobin is low and she is seeking advise

## 2020-11-20 ENCOUNTER — Other Ambulatory Visit: Payer: Self-pay

## 2020-11-20 ENCOUNTER — Ambulatory Visit (HOSPITAL_COMMUNITY)
Admission: RE | Admit: 2020-11-20 | Discharge: 2020-11-20 | Disposition: A | Discharge status: Home / Self Care | Payer: Medicare PPO | Source: Ambulatory Visit | Attending: Internal Medicine | Admitting: Internal Medicine

## 2020-11-20 DIAGNOSIS — D500 Iron deficiency anemia secondary to blood loss (chronic): Secondary | ICD-10-CM | POA: Diagnosis not present

## 2020-11-20 MED ORDER — SODIUM CHLORIDE 0.9 % IV SOLN
INTRAVENOUS | Status: DC | PRN
  Administered 2020-11-20: 250 mL via INTRAVENOUS

## 2020-11-20 MED ORDER — SODIUM CHLORIDE 0.9 % IV SOLN
25.0000 mg | Freq: Once | INTRAVENOUS | Status: AC
  Administered 2020-11-20: 25 mg via INTRAVENOUS
  Filled 2020-11-20: qty 0.5

## 2020-11-20 MED ORDER — SODIUM CHLORIDE 0.9 % IV SOLN
500.0000 mg | Freq: Every day | INTRAVENOUS | Status: DC
  Administered 2020-11-20: 500 mg via INTRAVENOUS
  Filled 2020-11-20: qty 10

## 2020-11-20 NOTE — Progress Notes (Signed)
PATIENT CARE CENTER NOTE  Diagnosis: Iron deficiency anemia due to chronic blood loss (D50.0)   Provider: Pricilla Holm, MD   Procedure: Infed infusion    Note: Patient received Infed infusion via PIV. Test dose given and patient observed for 1 hour. No adverse reaction noted and patient given full dose. Tolerated well with no adverse reaction. Vital signs remained stable. Discharge instructions given. Patient alert, oriented and ambulatory at discharge.

## 2020-11-20 NOTE — Discharge Instructions (Signed)
Iron Dextran injection What is this medicine? IRON DEXTRAN (AHY ern DEX tran) is an iron complex. Iron is used to make healthy red blood cells, which carry oxygen and nutrients through the body. This medicine is used to treat people who cannot take iron by mouth and have low levels of iron in the blood. This medicine may be used for other purposes; ask your health care provider or pharmacist if you have questions. COMMON BRAND NAME(S): Dexferrum, INFeD What should I tell my health care provider before I take this medicine? They need to know if you have any of these conditions:  anemia not caused by low iron levels  heart disease  high levels of iron in the blood  kidney disease  liver disease  an unusual or allergic reaction to iron, other medicines, foods, dyes, or preservatives  pregnant or trying to get pregnant  breast-feeding How should I use this medicine? This medicine is for injection into a vein or a muscle. It is given by a health care professional in a hospital or clinic setting. Talk to your pediatrician regarding the use of this medicine in children. While this drug may be prescribed for children as young as 18 months old for selected conditions, precautions do apply. Overdosage: If you think you have taken too much of this medicine contact a poison control center or emergency room at once. NOTE: This medicine is only for you. Do not share this medicine with others. What if I miss a dose? It is important not to miss your dose. Call your doctor or health care professional if you are unable to keep an appointment. What may interact with this medicine? Do not take this medicine with any of the following medications:  deferoxamine  dimercaprol  other iron products This medicine may also interact with the following medications:  chloramphenicol  deferasirox This list may not describe all possible interactions. Give your health care provider a list of all the  medicines, herbs, non-prescription drugs, or dietary supplements you use. Also tell them if you smoke, drink alcohol, or use illegal drugs. Some items may interact with your medicine. What should I watch for while using this medicine? Visit your doctor or health care professional regularly. Tell your doctor if your symptoms do not start to get better or if they get worse. You may need blood work done while you are taking this medicine. You may need to follow a special diet. Talk to your doctor. Foods that contain iron include: whole grains/cereals, dried fruits, beans, or peas, leafy green vegetables, and organ meats (liver, kidney). Long-term use of this medicine may increase your risk of some cancers. Talk to your doctor about how to limit your risk. What side effects may I notice from receiving this medicine? Side effects that you should report to your doctor or health care professional as soon as possible:  allergic reactions like skin rash, itching or hives, swelling of the face, lips, or tongue  blue lips, nails, or skin  breathing problems  changes in blood pressure  chest pain  confusion  fast, irregular heartbeat  feeling faint or lightheaded, falls  fever or chills  flushing, sweating, or hot feelings  joint or muscle aches or pains  pain, tingling, numbness in the hands or feet  seizures  unusually weak or tired Side effects that usually do not require medical attention (report to your doctor or health care professional if they continue or are bothersome):  change in taste (metallic taste)  diarrhea  headache  irritation at site where injected  nausea, vomiting  stomach upset This list may not describe all possible side effects. Call your doctor for medical advice about side effects. You may report side effects to FDA at 1-800-FDA-1088. Where should I keep my medicine? This drug is given in a hospital or clinic and will not be stored at home. NOTE: This  sheet is a summary. It may not cover all possible information. If you have questions about this medicine, talk to your doctor, pharmacist, or health care provider.  2021 Elsevier/Gold Standard (2008-02-22 16:59:50)

## 2020-12-13 ENCOUNTER — Other Ambulatory Visit: Payer: Self-pay

## 2020-12-13 ENCOUNTER — Other Ambulatory Visit (INDEPENDENT_AMBULATORY_CARE_PROVIDER_SITE_OTHER): Payer: Medicare PPO

## 2020-12-13 DIAGNOSIS — I272 Pulmonary hypertension, unspecified: Secondary | ICD-10-CM

## 2020-12-13 LAB — CBC
HCT: 25.6 % — ABNORMAL LOW (ref 36.0–46.0)
Hemoglobin: 8.7 g/dL — ABNORMAL LOW (ref 12.0–15.0)
MCHC: 33.9 g/dL (ref 30.0–36.0)
MCV: 105.6 fl — ABNORMAL HIGH (ref 78.0–100.0)
Platelets: 258 10*3/uL (ref 150.0–400.0)
RBC: 2.42 Mil/uL — ABNORMAL LOW (ref 3.87–5.11)
RDW: 15.6 % — ABNORMAL HIGH (ref 11.5–15.5)
WBC: 6.8 10*3/uL (ref 4.0–10.5)

## 2020-12-13 LAB — COMPREHENSIVE METABOLIC PANEL
ALT: 9 U/L (ref 0–35)
AST: 18 U/L (ref 0–37)
Albumin: 4.2 g/dL (ref 3.5–5.2)
Alkaline Phosphatase: 34 U/L — ABNORMAL LOW (ref 39–117)
BUN: 30 mg/dL — ABNORMAL HIGH (ref 6–23)
CO2: 29 mEq/L (ref 19–32)
Calcium: 9.5 mg/dL (ref 8.4–10.5)
Chloride: 101 mEq/L (ref 96–112)
Creatinine, Ser: 1.44 mg/dL — ABNORMAL HIGH (ref 0.40–1.20)
GFR: 33.92 mL/min — ABNORMAL LOW (ref >60.00)
Glucose, Bld: 80 mg/dL (ref 70–99)
Potassium: 4.2 mEq/L (ref 3.5–5.1)
Sodium: 138 mEq/L (ref 135–145)
Total Bilirubin: 0.6 mg/dL (ref 0.2–1.2)
Total Protein: 6.5 g/dL (ref 6.0–8.3)

## 2020-12-18 DIAGNOSIS — H40021 Open angle with borderline findings, high risk, right eye: Secondary | ICD-10-CM | POA: Diagnosis not present

## 2020-12-23 ENCOUNTER — Other Ambulatory Visit: Payer: Self-pay | Admitting: Gastroenterology

## 2020-12-25 ENCOUNTER — Encounter: Payer: Self-pay | Admitting: Gastroenterology

## 2020-12-25 ENCOUNTER — Ambulatory Visit: Payer: Medicare PPO | Admitting: Gastroenterology

## 2020-12-25 VITALS — BP 120/40 | HR 74 | Ht 63.0 in | Wt 107.0 lb

## 2020-12-25 DIAGNOSIS — D638 Anemia in other chronic diseases classified elsewhere: Secondary | ICD-10-CM | POA: Diagnosis not present

## 2020-12-25 DIAGNOSIS — R5383 Other fatigue: Secondary | ICD-10-CM

## 2020-12-25 NOTE — Patient Instructions (Signed)
If you are age 83 or older, your body mass index should be between 23-30. Your Body mass index is 18.95 kg/m. If this is out of the aforementioned range listed, please consider follow up with your Primary Care Provider.  If you are age 38 or younger, your body mass index should be between 19-25. Your Body mass index is 18.95 kg/m. If this is out of the aformentioned range listed, please consider follow up with your Primary Care Provider.   Start Benefiber 2 teaspoons in 8 ounces of liquid daily.  Check blood pressure at home.   Janett Billow will reach out to Dr. Sharlet Salina.

## 2020-12-25 NOTE — Progress Notes (Signed)
12/25/2020 Paula Ward [id number] 10/18/1936   HISTORY OF PRESENT ILLNESS: This is an 85 year old female who is a patient of Dr. Woodward Ku.  She has a history of scleroderma, pulmonary hypertension, small bowel AVMs contributing to chronic anemia.  She is here again today to discuss her anemia as well as complaints of extreme fatigue.  Her hemoglobin has been fairly stable at 8.7 g most recently.  Her last iron studies were within normal limits, although lower than previous.  She denies absolutely any sign of gastrointestinal bleeding with black or bloody stools.  05/2018 small bowel endoscopy: - A single bleeding angiodysplastic lesion/dieulafoy lesion in the duodenum. Injected s/p BICAP followed by endoscopic clipping. - A few non-bleeding angiodysplastic lesions in the duodenum. - Small hiatal hernia. - No specimens collected.  12/2015 small bowel endoscopy: - Two non-bleeding angioectasias in the jejunum. Treated with argon plasma coagulation (APC). - One recently bleeding angioectasia in the duodenum. Treated with argon plasma coagulation (APC). - Normal esophagus. - Normal stomach. - No specimens collected.  06/2014 colonoscopy: 1. Sessile polyp was found in the ascending colon; polypectomy was performed with cold forceps 2. There was moderate diverticulosis noted throughout the entire examined colon ,more severe in the left colon 3. no evidence of AVMs 4. nothing to account for right lower quadrant abdominal pain. Suspect adhesions  Past Medical History:  Diagnosis Date  . Anemia   . Angiodysplasia of stomach and duodenum   . Arthus phenomenon   . AVM (arteriovenous malformation)   . Blood transfusion without reported diagnosis    last 4 units 12-22-15, Iron infusion x2 last -01-07-16,01-14-16.  . Breast cancer ([place]) 1989   Left  . Candida esophagitis ([place])   . Cataract   . Chronic kidney disease    Chronic mild renal insuffiency  . CREST syndrome ([place])   . GERD  (gastroesophageal reflux disease)    w/ HH  . Interstitial lung disease ([place])   . Nodule of kidney   . PONV (postoperative nausea and vomiting)   . Pulmonary embolus ([place]) 2003  . Pulmonary hypertension ([place])    followed by Dr Gaynell Face at [facility], now Dr. Maryjean Ka visit end 2'17.  . Rectal incontinence   . Renal cell carcinoma ([place])   . Scleroderma ([place])   . Tubular adenoma of colon   . Uterine polyp    Past Surgical History:  Procedure Laterality Date  . APPENDECTOMY  1962  . BREAST LUMPECTOMY  1989   left  . CATARACT EXTRACTION, BILATERAL Bilateral 12/2013  . ENTEROSCOPY N/A 01/18/2016   Procedure: ENTEROSCOPY;  Surgeon: Mauri Pole, MD;  Location: [facility] ENDOSCOPY;  Service: Endoscopy;  Laterality: N/A;  . ENTEROSCOPY N/A 05/24/2018   Procedure: ENTEROSCOPY;  Surgeon: Jackquline Denmark, MD;  Location: [facility] ENDOSCOPY;  Service: Endoscopy;  Laterality: N/A;  . ESOPHAGOGASTRODUODENOSCOPY (EGD) WITH PROPOFOL N/A 12/21/2015   Procedure: ESOPHAGOGASTRODUODENOSCOPY (EGD) WITH PROPOFOL;  Surgeon: Irene Shipper, MD;  Location: [facility] ENDOSCOPY;  Service: Endoscopy;  Laterality: N/A;  . HOT HEMOSTASIS N/A 05/24/2018   Procedure: HOT HEMOSTASIS (ARGON PLASMA COAGULATION/BICAP);  Surgeon: Jackquline Denmark, MD;  Location: [facility];  Service: Endoscopy;  Laterality: N/A;  . IR GENERIC HISTORICAL  06/05/2016   IR RADIOLOGIST EVAL & MGMT 06/05/2016 Aletta Edouard, MD [facility] INTERV RAD  . IVC Filter    . KIDNEY SURGERY     left -"laser surgery by Dr. Kathlene Cote- 5 yrs ago" no removal  . SCHLEROTHERAPY  05/24/2018   Procedure:  SCHLEROTHERAPY;  Surgeon: Jackquline Denmark, MD;  Location: [facility];  Service: Endoscopy;;  . TONSILLECTOMY    . TUBAL LIGATION      reports that she has never smoked. She has never used smokeless tobacco. She reports that she does not drink alcohol and does not use drugs. family history includes Alzheimer's disease in her mother; Bladder Cancer in her father; Diabetes in her father and  sister; Esophageal cancer in her paternal uncle; Lung cancer in her sister; Prostate cancer in her father. Allergies  Allergen Reactions  . Codeine Nausea Only    Hallucinations, too  . Other Nausea And Vomiting    "-mycin" antibiotics.   Also cause hallucinations.  . Erythromycin Nausea And Vomiting  . Lisinopril     Pt doesn't remember       Outpatient Encounter Medications as of 12/25/2020  Medication Sig  . ACCU-CHEK AVIVA PLUS test strip   . acetaminophen (TYLENOL) 325 MG tablet Take 650 mg by mouth every 6 (six) hours as needed for mild pain.   Marland Kitchen albuterol (VENTOLIN HFA) 108 (90 Base) MCG/ACT inhaler INHALE 2 PUFFS INTO THE LUNGS EVERY 6 HOURS AS NEEDED FOR WHEEZING OR SHORTNESS OF BREATH  . ALPRAZolam (XANAX) 0.25 MG tablet Take 0.5 tablets (0.125 mg total) by mouth at bedtime as needed.  Marland Kitchen atovaquone (MEPRON) 750 MG/5ML suspension   . bacitracin ointment Apply 1 application topically 2 (two) times daily.  Marland Kitchen diltiazem (CARDIZEM CD) 120 MG 24 hr capsule Take 120 mg by mouth daily.  . fluticasone (FLONASE) 50 MCG/ACT nasal spray INSTILL 1 SPRAY INTO EACH NOSTRIL TWICE A DAY IF NEEDED  . lidocaine (XYLOCAINE) 2 % solution Use as directed 5 mLs in the mouth or throat every 3 (three) hours as needed for mouth pain.  . meclizine (ANTIVERT) 12.5 MG tablet Take 1-2 tablets (12.5-25 mg total) by mouth 3 (three) times daily as needed for dizziness.  . montelukast (SINGULAIR) 10 MG tablet TAKE 1 TABLET(10 MG) BY MOUTH AT BEDTIME  . Mouthwashes (DRY MOUTH MOUTHWASH) LIQD Use as directed 5 mLs in the mouth or throat as needed (dry mouth).  . Multiple Vitamin (MULTIVITAMIN WITH MINERALS) TABS tablet Take 1 tablet by mouth daily. NO IRON  . mupirocin cream (BACTROBAN) 2 % Apply 1 application topically 2 (two) times daily.  Marland Kitchen omeprazole (PRILOSEC) 40 MG capsule TAKE 1 CAPSULE(40 MG) BY MOUTH DAILY  . OPSUMIT 10 MG TABS Take 1 tablet by mouth daily.   . pantoprazole (PROTONIX) 40 MG tablet TAKE 1  TABLET(40 MG) BY MOUTH TWICE DAILY  . pilocarpine (SALAGEN) 5 MG tablet TAKE 1 TABLET(5 MG) BY MOUTH THREE TIMES DAILY AS NEEDED  . predniSONE (DELTASONE) 2.5 MG tablet Take 2.5 mg by mouth daily with breakfast.  . spironolactone (ALDACTONE) 25 MG tablet   . sucralfate (CARAFATE) 1 GM/10ML suspension TAKE 10 MLS BY MOUTH AT BEDTIME   No facility-administered encounter medications on file as of 12/25/2020.     REVIEW OF SYSTEMS  : All other systems reviewed and negative except where noted in the History of Present Illness.   PHYSICAL EXAM: BP (!) 120/40   Pulse 74   Ht _0  (1.6 m)   Wt 107 lb (48.5 kg)   BMI 18.95 kg/m  General: Well developed white female in no acute distress Head: Normocephalic and atraumatic Eyes:  Sclerae anicteric, conjunctiva pink. Ears: Normal auditory acuity Lungs: Clear throughout to auscultation; no W/R/R. Heart: Regular rate and rhythm; no M/R/G. Abdomen: Soft,  non-distended.  BS present.  Non-tender. Musculoskeletal: Symmetrical with no gross deformities  Skin: No lesions on visible extremities Extremities: No edema  Neurological: Alert oriented x 4, grossly non-focal Psychological:  Alert and cooperative. Normal mood and affect  ASSESSMENT AND PLAN: *Chronic anemia:  In part at least previously due to history of AVMs in her GI tract/small bowel.  She has had no evidence of overt/active GI bleeding.  Her hemoglobin is stable at 8.7 g most recently.  Iron studies are normal although lower than previous.  I think that supportive care for her anemia is the right route to go due to her age and her lung disease.  Her hemoglobin has certainly been stable and she has had no sign of overt GI bleeding.  Her AVMs certainly could be an ongoing issue for her.  CBC can be monitored by her PCP. *Fatigue:  In regards to her fatigue, her blood pressure was extremely low today, 88Q diastolic on multiple checks at her visit today.  I have asked her to begin checking her  blood pressure regularly at home.  I told her that I would reach out to her PCP, Dr. Sharlet Salina, in regards to this and she will likely need to make an appointment to see Dr. Sharlet Salina in order to evaluate and discuss this further.   CC:  Hoyt Koch, *

## 2020-12-26 ENCOUNTER — Telehealth: Payer: Self-pay | Admitting: Internal Medicine

## 2020-12-26 NOTE — Telephone Encounter (Signed)
LVM for pt to rtn my call to schedule AWV with [facility]. Please schedule appt if pt calls the office.  

## 2021-01-04 ENCOUNTER — Telehealth: Payer: Self-pay | Admitting: Internal Medicine

## 2021-01-04 NOTE — Telephone Encounter (Signed)
LVM for pt to rtn my call to schedule AWV with [facility]. Please schedule appt if pt calls the office.

## 2021-01-05 ENCOUNTER — Other Ambulatory Visit: Payer: Self-pay | Admitting: Internal Medicine

## 2021-01-07 ENCOUNTER — Other Ambulatory Visit: Payer: Self-pay | Admitting: Internal Medicine

## 2021-01-07 ENCOUNTER — Telehealth: Payer: Self-pay

## 2021-01-07 NOTE — Telephone Encounter (Signed)
Pt notified to contact Dr Sharlet Salina to make follow up appt.

## 2021-01-07 NOTE — Telephone Encounter (Signed)
-----  Message from Loralie Champagne, PA-C sent at 01/07/2021 12:05 PM EDT ----- Can you please let the patient know that I reached out to Dr. Sharlet Salina and she would like to see her in the office to discuss possibly adjusting her blood pressure medications.  She has not been seen in the office by her since August 2021.  Thank you,  Jess  ----- Message ----- From: Hoyt Koch, MD Sent: 12/31/2020   9:41 AM EDT To: Loralie Champagne, PA-C  I haven't seen her in the office since August 2021, can you have her come in for a follow up and we can change meds if appropriate? Thanks.  ----- Message ----- From: Loralie Champagne, PA-C Sent: 12/25/2020   5:26 PM EDT To: Hoyt Koch, MD  Hello.  I saw this lady in clinic today in regards to her anemia and complaints of fatigue.  I think that supportive care for her anemia is the right route to go due to her age and her lung disease.  Her hemoglobin has certainly been stable and she has had no sign of overt GI bleeding.  Her AVMs certainly could be an ongoing issue for her.  In regards to her fatigue, her blood pressure was very low in our office today with 40 diastolic reading, on multiple checks.  I see that it usually runs low, but maybe not quite that low.  I told her that I would reach out to you in regards to this, I do not know if her diltiazem can be reduced or discontinued.  I asked her to start checking her blood pressure at home a couple of times a day.  Thank you for your help!  Alonza Bogus

## 2021-01-10 ENCOUNTER — Encounter: Payer: Self-pay | Admitting: Internal Medicine

## 2021-01-10 ENCOUNTER — Other Ambulatory Visit: Payer: Self-pay

## 2021-01-10 ENCOUNTER — Ambulatory Visit (INDEPENDENT_AMBULATORY_CARE_PROVIDER_SITE_OTHER): Payer: Medicare PPO | Admitting: Internal Medicine

## 2021-01-10 VITALS — BP 128/60 | HR 85 | Temp 98.0°F | Resp 18 | Ht 63.0 in | Wt 107.8 lb

## 2021-01-10 DIAGNOSIS — R3915 Urgency of urination: Secondary | ICD-10-CM | POA: Diagnosis not present

## 2021-01-10 DIAGNOSIS — I272 Pulmonary hypertension, unspecified: Secondary | ICD-10-CM | POA: Diagnosis not present

## 2021-01-10 DIAGNOSIS — I10 Essential (primary) hypertension: Secondary | ICD-10-CM | POA: Diagnosis not present

## 2021-01-10 DIAGNOSIS — D500 Iron deficiency anemia secondary to blood loss (chronic): Secondary | ICD-10-CM | POA: Diagnosis not present

## 2021-01-10 LAB — URINALYSIS, ROUTINE W REFLEX MICROSCOPIC
Bilirubin Urine: NEGATIVE
Hgb urine dipstick: NEGATIVE
Ketones, ur: NEGATIVE
Leukocytes,Ua: NEGATIVE
Nitrite: NEGATIVE
Specific Gravity, Urine: 1.01 (ref 1.000–1.030)
Total Protein, Urine: 30 — AB
Urine Glucose: NEGATIVE
Urobilinogen, UA: 0.2 (ref 0.0–1.0)
pH: 7 (ref 5.0–8.0)

## 2021-01-10 LAB — CBC
HCT: 28.3 % — ABNORMAL LOW (ref 36.0–46.0)
Hemoglobin: 9.4 g/dL — ABNORMAL LOW (ref 12.0–15.0)
MCHC: 33.3 g/dL (ref 30.0–36.0)
MCV: 104 fl — ABNORMAL HIGH (ref 78.0–100.0)
Platelets: 245 10*3/uL (ref 150.0–400.0)
RBC: 2.72 Mil/uL — ABNORMAL LOW (ref 3.87–5.11)
RDW: 15.1 % (ref 11.5–15.5)
WBC: 6.3 10*3/uL (ref 4.0–10.5)

## 2021-01-10 LAB — COMPREHENSIVE METABOLIC PANEL
ALT: 11 U/L (ref 0–35)
AST: 18 U/L (ref 0–37)
Albumin: 4.3 g/dL (ref 3.5–5.2)
Alkaline Phosphatase: 38 U/L — ABNORMAL LOW (ref 39–117)
BUN: 29 mg/dL — ABNORMAL HIGH (ref 6–23)
CO2: 30 mEq/L (ref 19–32)
Calcium: 9.3 mg/dL (ref 8.4–10.5)
Chloride: 101 mEq/L (ref 96–112)
Creatinine, Ser: 1.39 mg/dL — ABNORMAL HIGH (ref 0.40–1.20)
GFR: 35.37 mL/min — ABNORMAL LOW (ref >60.00)
Glucose, Bld: 89 mg/dL (ref 70–99)
Potassium: 4.1 mEq/L (ref 3.5–5.1)
Sodium: 139 mEq/L (ref 135–145)
Total Bilirubin: 0.5 mg/dL (ref 0.2–1.2)
Total Protein: 6.5 g/dL (ref 6.0–8.3)

## 2021-01-10 LAB — FERRITIN: Ferritin: 70.6 ng/mL (ref 10.0–291.0)

## 2021-01-10 NOTE — Progress Notes (Signed)
Subjective:   Patient ID: Paula Ward, female    DOB: 01-09-1936, 85 y.o.   MRN: [id number]  HPI The patient is an 85 YO female coming in for concerns about low BP at GI visit recently. She has low iron levels and low Hg which is overall stable, sometimes up or down. No noticeable blood in stool. Does have intermittent dizziness. We ordered iron infusion which was done beginning of February. She has not felt any different since this was done. Denies lightheadedness currently. BP runing 130s/60s at home mostly.   Review of Systems  Constitutional: Negative.   HENT: Negative.   Eyes: Negative.   Respiratory: Positive for shortness of breath. Negative for cough and chest tightness.   Cardiovascular: Negative for chest pain, palpitations and leg swelling.  Gastrointestinal: Negative for abdominal distention, abdominal pain, constipation, diarrhea, nausea and vomiting.  Musculoskeletal: Negative.   Skin: Negative.   Neurological: Positive for dizziness.  Psychiatric/Behavioral: Negative.     Objective:  Physical Exam Constitutional:      Appearance: She is well-developed.  HENT:     Head: Normocephalic and atraumatic.  Cardiovascular:     Rate and Rhythm: Normal rate and regular rhythm.  Pulmonary:     Effort: Pulmonary effort is normal. No respiratory distress.     Breath sounds: Normal breath sounds. No wheezing or rales.  Abdominal:     General: Bowel sounds are normal. There is no distension.     Palpations: Abdomen is soft.     Tenderness: There is no abdominal tenderness. There is no rebound.  Musculoskeletal:     Cervical back: Normal range of motion.  Skin:    General: Skin is warm and dry.  Neurological:     Mental Status: She is alert and oriented to person, place, and time.     Coordination: Coordination normal.     Vitals:   01/10/21 0918  BP: 128/60  Pulse: 85  Resp: 18  Temp: 98 F (36.7 C)  TempSrc: Oral  SpO2: 97%  Weight: 107 lb 12.8 oz (48.9  kg)  Height: 5' 3" (1.6 m)    This visit occurred during the SARS-CoV-2 public health emergency.  Safety protocols were in place, including screening questions prior to the visit, additional usage of staff PPE, and extensive cleaning of exam room while observing appropriate contact time as indicated for disinfecting solutions.   Assessment & Plan:

## 2021-01-10 NOTE — Patient Instructions (Addendum)
We will check the labs and urine today.  Ask the pharmacist about the shingrix vaccine.

## 2021-01-10 NOTE — Assessment & Plan Note (Signed)
BP at goal here and on home readings. Will keep diltiazem and spironolactone at current doses for now.

## 2021-01-10 NOTE — Assessment & Plan Note (Signed)
Checking CBC and ferritin. Recent iron infusion early February and she has not felt improvement since that time. She has also started oral iron replacement again.

## 2021-01-18 ENCOUNTER — Telehealth: Payer: Self-pay | Admitting: Gastroenterology

## 2021-01-18 NOTE — Telephone Encounter (Signed)
I am not sure what that is.  Does not look like we prescribed anything recently.  Will you please check with her to see what she is referring to?

## 2021-01-21 ENCOUNTER — Other Ambulatory Visit: Payer: Self-pay

## 2021-01-21 MED ORDER — SUCRALFATE 1 GM/10ML PO SUSP: 1.0000 g | Freq: Four times a day (QID) | ORAL | 6 refills | Status: DC

## 2021-01-23 NOTE — Telephone Encounter (Signed)
See [facility] message.

## 2021-02-09 NOTE — Progress Notes (Signed)
Reviewed and agree with documentation and assessment and plan. Damaris Hippo , MD

## 2021-02-12 ENCOUNTER — Other Ambulatory Visit (INDEPENDENT_AMBULATORY_CARE_PROVIDER_SITE_OTHER): Payer: Medicare PPO

## 2021-02-12 ENCOUNTER — Other Ambulatory Visit: Payer: Self-pay

## 2021-02-12 DIAGNOSIS — I272 Pulmonary hypertension, unspecified: Secondary | ICD-10-CM | POA: Diagnosis not present

## 2021-02-12 LAB — COMPREHENSIVE METABOLIC PANEL
ALT: 11 U/L (ref 0–35)
AST: 19 U/L (ref 0–37)
Albumin: 3.9 g/dL (ref 3.5–5.2)
Alkaline Phosphatase: 33 U/L — ABNORMAL LOW (ref 39–117)
BUN: 29 mg/dL — ABNORMAL HIGH (ref 6–23)
CO2: 30 mEq/L (ref 19–32)
Calcium: 9.4 mg/dL (ref 8.4–10.5)
Chloride: 101 mEq/L (ref 96–112)
Creatinine, Ser: 1.28 mg/dL — ABNORMAL HIGH (ref 0.40–1.20)
GFR: 39.02 mL/min — ABNORMAL LOW (ref >60.00)
Glucose, Bld: 81 mg/dL (ref 70–99)
Potassium: 4.5 mEq/L (ref 3.5–5.1)
Sodium: 137 mEq/L (ref 135–145)
Total Bilirubin: 0.5 mg/dL (ref 0.2–1.2)
Total Protein: 6.3 g/dL (ref 6.0–8.3)

## 2021-02-12 LAB — CBC
HCT: 27 % — ABNORMAL LOW (ref 36.0–46.0)
Hemoglobin: 8.9 g/dL — ABNORMAL LOW (ref 12.0–15.0)
MCHC: 33.1 g/dL (ref 30.0–36.0)
MCV: 103.3 fl — ABNORMAL HIGH (ref 78.0–100.0)
Platelets: 240 10*3/uL (ref 150.0–400.0)
RBC: 2.61 Mil/uL — ABNORMAL LOW (ref 3.87–5.11)
RDW: 14.8 % (ref 11.5–15.5)
WBC: 6.6 10*3/uL (ref 4.0–10.5)

## 2021-02-20 ENCOUNTER — Ambulatory Visit (INDEPENDENT_AMBULATORY_CARE_PROVIDER_SITE_OTHER): Payer: Medicare PPO

## 2021-02-20 ENCOUNTER — Telehealth: Payer: Self-pay

## 2021-02-20 ENCOUNTER — Telehealth (INDEPENDENT_AMBULATORY_CARE_PROVIDER_SITE_OTHER): Payer: Medicare PPO | Admitting: Internal Medicine

## 2021-02-20 DIAGNOSIS — R059 Cough, unspecified: Secondary | ICD-10-CM | POA: Diagnosis not present

## 2021-02-20 DIAGNOSIS — R0602 Shortness of breath: Secondary | ICD-10-CM | POA: Diagnosis not present

## 2021-02-20 DIAGNOSIS — J3089 Other allergic rhinitis: Secondary | ICD-10-CM | POA: Diagnosis not present

## 2021-02-20 DIAGNOSIS — J849 Interstitial pulmonary disease, unspecified: Secondary | ICD-10-CM

## 2021-02-20 DIAGNOSIS — J90 Pleural effusion, not elsewhere classified: Secondary | ICD-10-CM | POA: Diagnosis not present

## 2021-02-20 DIAGNOSIS — R5383 Other fatigue: Secondary | ICD-10-CM | POA: Diagnosis not present

## 2021-02-20 NOTE — Progress Notes (Signed)
Virtual Visit via Video Note  I connected with Paula Ward on 02/20/21 at 11:00 AM EDT by a video enabled telemedicine application and verified that I am speaking with the correct person using two identifiers.  The patient and the provider were at separate locations throughout the entire encounter. Patient location: home, Provider location: work   I discussed the limitations of evaluation and management by telemedicine and the availability of in person appointments. The patient expressed understanding and agreed to proceed. The patient and the provider were the only parties present for the visit unless noted in HPI below.  History of Present Illness: The patient is a 85 y.o. female with visit for nasal drip and congestion. 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 Saturday she got the covid-19 booster shot. Tired on Sunday and sneezing a lot, stayed home from church. Has less nasal congestion in the last day or so. Denies fevers or chills. Overall it is improving but still feeling tired a lot. Has tried elberberry cough syrup. Has taken 2 covid-19 tests which were both negative. Oxygen lower than normal running 91-92% usually 96-97%.   Observations/Objective: Appearance: normal, breathing appears normal, slight nasal tone to voice not typically present, casual grooming, abdomen does not appear distended, throat not well visualized, mental status is A and O times 3  Assessment and Plan: See problem oriented charting  Follow Up Instructions: cxr, treat as appropriate from there  I discussed the assessment and treatment plan with the patient. The patient was provided an opportunity to ask questions and all were answered. The patient agreed with the plan and demonstrated an understanding of the instructions.   The patient was advised to call back or seek an in-person evaluation if the symptoms worsen or if the condition fails to improve as anticipated.  Hoyt Koch, MD

## 2021-02-20 NOTE — Telephone Encounter (Signed)
Per Dr. Sharlet Salina patient's office visit needed to be converted into a virtual visit due to the patient's symptoms. I was unable to get in contact with the patient. LDVM letting the patient know that her appointment had been converted into a virtual visit today at 11 am. Office number was provided in case she has additional questions or concerns.

## 2021-02-21 ENCOUNTER — Encounter: Payer: Self-pay | Admitting: Internal Medicine

## 2021-02-21 ENCOUNTER — Telehealth: Payer: Self-pay | Admitting: Internal Medicine

## 2021-02-21 NOTE — Telephone Encounter (Signed)
Imaging has not been resulted. Patient made aware

## 2021-02-21 NOTE — Telephone Encounter (Signed)
   Please call patient to discuss imaging results

## 2021-02-21 NOTE — Assessment & Plan Note (Signed)
Checking CXR given she is concerned about this moving to lungs with the drainage and history. Also with lower oxygen levels this will help rule out change in her cardiac effusion.

## 2021-02-21 NOTE — Assessment & Plan Note (Signed)
Checking CXR to rule out flare given lower oxygen levels.

## 2021-02-22 ENCOUNTER — Other Ambulatory Visit: Payer: Self-pay | Admitting: Internal Medicine

## 2021-02-22 MED ORDER — AZITHROMYCIN 250 MG PO TABS: ORAL_TABLET | ORAL | 0 refills | Status: AC

## 2021-02-27 ENCOUNTER — Encounter: Payer: Self-pay | Admitting: Internal Medicine

## 2021-02-28 DIAGNOSIS — I272 Pulmonary hypertension, unspecified: Secondary | ICD-10-CM | POA: Diagnosis not present

## 2021-02-28 DIAGNOSIS — K922 Gastrointestinal hemorrhage, unspecified: Secondary | ICD-10-CM | POA: Diagnosis not present

## 2021-02-28 DIAGNOSIS — Q408 Other specified congenital malformations of upper alimentary tract: Secondary | ICD-10-CM | POA: Diagnosis not present

## 2021-02-28 DIAGNOSIS — I080 Rheumatic disorders of both mitral and aortic valves: Secondary | ICD-10-CM | POA: Diagnosis not present

## 2021-02-28 DIAGNOSIS — R0600 Dyspnea, unspecified: Secondary | ICD-10-CM | POA: Diagnosis not present

## 2021-02-28 DIAGNOSIS — I781 Nevus, non-neoplastic: Secondary | ICD-10-CM | POA: Diagnosis not present

## 2021-02-28 DIAGNOSIS — I2721 Secondary pulmonary arterial hypertension: Secondary | ICD-10-CM | POA: Diagnosis not present

## 2021-02-28 DIAGNOSIS — K219 Gastro-esophageal reflux disease without esophagitis: Secondary | ICD-10-CM | POA: Diagnosis not present

## 2021-02-28 DIAGNOSIS — L940 Localized scleroderma [morphea]: Secondary | ICD-10-CM | POA: Diagnosis not present

## 2021-02-28 DIAGNOSIS — R5383 Other fatigue: Secondary | ICD-10-CM | POA: Diagnosis not present

## 2021-02-28 DIAGNOSIS — I313 Pericardial effusion (noninflammatory): Secondary | ICD-10-CM | POA: Diagnosis not present

## 2021-02-28 DIAGNOSIS — J849 Interstitial pulmonary disease, unspecified: Secondary | ICD-10-CM | POA: Diagnosis not present

## 2021-02-28 DIAGNOSIS — J8410 Pulmonary fibrosis, unspecified: Secondary | ICD-10-CM | POA: Diagnosis not present

## 2021-03-05 ENCOUNTER — Telehealth: Payer: Self-pay | Admitting: Internal Medicine

## 2021-03-05 ENCOUNTER — Ambulatory Visit (INDEPENDENT_AMBULATORY_CARE_PROVIDER_SITE_OTHER): Payer: Medicare PPO | Admitting: Internal Medicine

## 2021-03-05 ENCOUNTER — Other Ambulatory Visit: Payer: Self-pay

## 2021-03-05 ENCOUNTER — Encounter: Payer: Self-pay | Admitting: Internal Medicine

## 2021-03-05 ENCOUNTER — Ambulatory Visit (INDEPENDENT_AMBULATORY_CARE_PROVIDER_SITE_OTHER): Payer: Medicare PPO

## 2021-03-05 VITALS — BP 140/48 | HR 84 | Temp 98.0°F | Ht 63.0 in | Wt 108.9 lb

## 2021-03-05 DIAGNOSIS — J90 Pleural effusion, not elsewhere classified: Secondary | ICD-10-CM | POA: Diagnosis not present

## 2021-03-05 DIAGNOSIS — R0789 Other chest pain: Secondary | ICD-10-CM | POA: Diagnosis not present

## 2021-03-05 DIAGNOSIS — J189 Pneumonia, unspecified organism: Secondary | ICD-10-CM

## 2021-03-05 LAB — CBC WITH DIFFERENTIAL/PLATELET
Basophils Absolute: 0.1 10*3/uL (ref 0.0–0.1)
Basophils Relative: 0.7 % (ref 0.0–3.0)
Eosinophils Absolute: 0.1 10*3/uL (ref 0.0–0.7)
Eosinophils Relative: 0.8 % (ref 0.0–5.0)
HCT: 28.2 % — ABNORMAL LOW (ref 36.0–46.0)
Hemoglobin: 9.3 g/dL — ABNORMAL LOW (ref 12.0–15.0)
Lymphocytes Relative: 20.5 % (ref 12.0–46.0)
Lymphs Abs: 1.6 10*3/uL (ref 0.7–4.0)
MCHC: 33.1 g/dL (ref 30.0–36.0)
MCV: 103.1 fl — ABNORMAL HIGH (ref 78.0–100.0)
Monocytes Absolute: 0.5 10*3/uL (ref 0.1–1.0)
Monocytes Relative: 5.9 % (ref 3.0–12.0)
Neutro Abs: 5.6 10*3/uL (ref 1.4–7.7)
Neutrophils Relative %: 72.1 % (ref 43.0–77.0)
Platelets: 289 10*3/uL (ref 150.0–400.0)
RBC: 2.73 Mil/uL — ABNORMAL LOW (ref 3.87–5.11)
RDW: 15.6 % — ABNORMAL HIGH (ref 11.5–15.5)
WBC: 7.8 10*3/uL (ref 4.0–10.5)

## 2021-03-05 LAB — COMPREHENSIVE METABOLIC PANEL
ALT: 11 U/L (ref 0–35)
AST: 18 U/L (ref 0–37)
Albumin: 4.3 g/dL (ref 3.5–5.2)
Alkaline Phosphatase: 39 U/L (ref 39–117)
BUN: 29 mg/dL — ABNORMAL HIGH (ref 6–23)
CO2: 29 mEq/L (ref 19–32)
Calcium: 9.2 mg/dL (ref 8.4–10.5)
Chloride: 99 mEq/L (ref 96–112)
Creatinine, Ser: 1.24 mg/dL — ABNORMAL HIGH (ref 0.40–1.20)
GFR: 40.52 mL/min — ABNORMAL LOW (ref >60.00)
Glucose, Bld: 122 mg/dL — ABNORMAL HIGH (ref 70–99)
Potassium: 4.2 mEq/L (ref 3.5–5.1)
Sodium: 136 mEq/L (ref 135–145)
Total Bilirubin: 0.5 mg/dL (ref 0.2–1.2)
Total Protein: 6.6 g/dL (ref 6.0–8.3)

## 2021-03-05 LAB — BRAIN NATRIURETIC PEPTIDE: Pro B Natriuretic peptide (BNP): 1277 pg/mL — ABNORMAL HIGH (ref 0.0–100.0)

## 2021-03-05 MED ORDER — FUROSEMIDE 20 MG PO TABS: 20.0000 mg | ORAL_TABLET | Freq: Every day | ORAL | 0 refills | Status: DC

## 2021-03-05 NOTE — Patient Instructions (Signed)
   Have blood work and a chest xray downstairs.    We will call you with results.

## 2021-03-05 NOTE — Progress Notes (Signed)
Subjective:    Patient ID: Paula Ward, female    DOB: 1936/03/05, 85 y.o.   MRN: [id number]  HPI The patient is here for an acute visit.  Her symptoms started after working out in the yard.    5/4 - VV with PCP for congestion, PND - possible allergies. O2 level lower than usual.   CXR done - miLD CHF. Large pericardial effusion. Left lung basse with atelectasis vs consolidation, trace b/l pleural effusions.  She was prescribed a zpak.   Finished last Tuesday.  Saw [facility] pulm and they confirmed PNA.  She decreased prednisone to QOD to improve her immune system.    She gets very tired just trying to do anything.  She has tightness in her chest.     She is concerned the PNA is not gone.   Last week she saw pulmonary and had an echocardiogram done.  She was having some chest tightness at that time and did discuss that with them, but has gotten worse since then.  Medications and allergies reviewed with patient and updated if appropriate.  Patient Active Problem List   Diagnosis Date Noted  . Anemia, chronic disease 12/25/2020  . Fatigue 12/25/2020  . Hoarseness 07/24/2020  . Drowsiness 12/17/2018  . Leg swelling 09/03/2018  . Orthopnea 06/24/2018  . Dry mouth 02/15/2018  . Allergic rhinitis 11/05/2017  . Cerebellar 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 ([place]) 10/26/2017  . Routine general medical examination at a health care facility 01/30/2017  . Pericardial effusion 01/21/2016  . 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HCC)   . Osteoarthritis 02/25/2015  . Mass of left side of neck   . CKD (chronic kidney disease) stage 3, GFR 30-59 ml/min (HCC) 09/01/2012  . RECTAL INCONTINENCE 03/27/2009  . Angiodysplasia of stomach and duodenum 08/22/2008  . ADENOCARCINOMA, BREAST, LEFT 07/26/2007  . Anemia, iron deficiency 07/26/2007  . Essential hypertension 07/26/2007  . Pulmonary hypertension ([place]) 07/26/2007  . GASTROESOPHAGEAL REFLUX DISEASE 07/26/2007  . SCLERODERMA 07/26/2007   . ILD (interstitial lung disease) ([place]) 07/26/2007  . PULMONARY EMBOLISM, HX OF 07/26/2007    Current Outpatient Medications on File Prior to Visit  Medication Sig Dispense Refill  . tadalafil, PAH, (ADCIRCA) 20 MG tablet Take by mouth.    Marland Kitchen ACCU-CHEK AVIVA PLUS test strip     . acetaminophen (TYLENOL) 325 MG tablet Take 650 mg by mouth every 6 (six) hours as needed for mild pain.     Marland Kitchen albuterol (VENTOLIN HFA) 108 (90 Base) MCG/ACT inhaler INHALE 2 PUFFS INTO THE LUNGS EVERY 6 HOURS AS NEEDED FOR WHEEZING OR SHORTNESS OF BREATH 6.7 g 3  . ALPRAZolam (XANAX) 0.25 MG tablet Take 0.5 tablets (0.125 mg total) by mouth at bedtime as needed. 30 tablet 3  . atovaquone (MEPRON) 750 MG/5ML suspension     . bacitracin ointment Apply 1 application topically 2 (two) times daily. 120 g 0  . diltiazem (CARDIZEM CD) 120 MG 24 hr capsule Take 120 mg by mouth daily.    . fluticasone (FLONASE) 50 MCG/ACT nasal spray INSTILL 1 SPRAY INTO EACH NOSTRIL TWICE A DAY IF NEEDED 48 g 3  . lidocaine (XYLOCAINE) 2 % solution Use as directed 5 mLs in the mouth or throat every 3 (three) hours as needed for mouth pain. 100 mL 3  . meclizine (ANTIVERT) 12.5 MG tablet Take 1-2 tablets (12.5-25 mg total) by mouth 3 (three) times daily as needed for dizziness. 30 tablet 1  . montelukast (SINGULAIR) 10  MG tablet TAKE 1 TABLET(10 MG) BY MOUTH AT BEDTIME 30 tablet 0  . Mouthwashes (DRY MOUTH MOUTHWASH) LIQD Use as directed 5 mLs in the mouth or throat as needed (dry mouth). 473 mL 11  . Multiple Vitamin (MULTIVITAMIN WITH MINERALS) TABS tablet Take 1 tablet by mouth daily. NO IRON    . mupirocin cream (BACTROBAN) 2 % Apply 1 application topically 2 (two) times daily. 30 g 0  . omeprazole (PRILOSEC) 40 MG capsule TAKE 1 CAPSULE(40 MG) BY MOUTH DAILY 90 capsule 3  . OPSUMIT 10 MG TABS Take 1 tablet by mouth daily.     . pantoprazole (PROTONIX) 40 MG tablet TAKE 1 TABLET(40 MG) BY MOUTH TWICE DAILY 180 tablet 11  . pilocarpine  (SALAGEN) 5 MG tablet TAKE 1 TABLET(5 MG) BY MOUTH THREE TIMES DAILY AS NEEDED 270 tablet 1  . predniSONE (DELTASONE) 2.5 MG tablet Take 2.5 mg by mouth daily with breakfast.    . spironolactone (ALDACTONE) 25 MG tablet     . sucralfate (CARAFATE) 1 GM/10ML suspension TAKE 10 MLS BY MOUTH AT BEDTIME 900 mL 3   No current facility-administered medications on file prior to visit.    Past Medical History:  Diagnosis Date  . Anemia   . Angiodysplasia of stomach and duodenum   . Arthus phenomenon   . AVM (arteriovenous malformation)   . Blood transfusion without reported diagnosis    last 4 units 12-22-15, Iron infusion x2 last -01-07-16,01-14-16.  . Breast cancer ([place]) 1989   Left  . Candida esophagitis ([place])   . Cataract   . Chronic kidney disease    Chronic mild renal insuffiency  . CREST syndrome ([place])   . GERD (gastroesophageal reflux disease)    w/ HH  . Interstitial lung disease ([place])   . Nodule of kidney   . PONV (postoperative nausea and vomiting)   . Pulmonary embolus ([place]) 2003  . Pulmonary hypertension ([place])    followed by Dr Gaynell Face at [facility], now Dr. Maryjean Ka visit end 2'17.  . Rectal incontinence   . Renal cell carcinoma ([place])   . Scleroderma ([place])   . Tubular adenoma of colon   . Uterine polyp     Past Surgical History:  Procedure Laterality Date  . APPENDECTOMY  1962  . BREAST LUMPECTOMY  1989   left  . CATARACT EXTRACTION, BILATERAL Bilateral 12/2013  . ENTEROSCOPY N/A 01/18/2016   Procedure: ENTEROSCOPY;  Surgeon: Mauri Pole, MD;  Location: [facility] ENDOSCOPY;  Service: Endoscopy;  Laterality: N/A;  . ENTEROSCOPY N/A 05/24/2018   Procedure: ENTEROSCOPY;  Surgeon: Jackquline Denmark, MD;  Location: [facility] ENDOSCOPY;  Service: Endoscopy;  Laterality: N/A;  . ESOPHAGOGASTRODUODENOSCOPY (EGD) WITH PROPOFOL N/A 12/21/2015   Procedure: ESOPHAGOGASTRODUODENOSCOPY (EGD) WITH PROPOFOL;  Surgeon: Irene Shipper, MD;  Location: [facility] ENDOSCOPY;  Service: Endoscopy;  Laterality: N/A;   . HOT HEMOSTASIS N/A 05/24/2018   Procedure: HOT HEMOSTASIS (ARGON PLASMA COAGULATION/BICAP);  Surgeon: Jackquline Denmark, MD;  Location: [facility];  Service: Endoscopy;  Laterality: N/A;  . IR GENERIC HISTORICAL  06/05/2016   IR RADIOLOGIST EVAL & MGMT 06/05/2016 Aletta Edouard, MD [facility] INTERV RAD  . IVC Filter    . KIDNEY SURGERY     left -"laser surgery by Dr. Kathlene Cote- 5 yrs ago" no removal  . SCHLEROTHERAPY  05/24/2018   Procedure: Woodward Ku;  Surgeon: Jackquline Denmark, MD;  Location: [facility] ENDOSCOPY;  Service: Endoscopy;;  . TONSILLECTOMY    . TUBAL LIGATION      Social  History   Socioeconomic History  . Marital status: Married    Spouse name: Not on file  . Number of children: 2  . Years of education: Not on file  . Highest education level: Not on file  Occupational History  . Occupation: Retired  Tobacco Use  . Smoking status: Never Smoker  . Smokeless tobacco: Never Used  Vaping Use  . Vaping Use: Never used  Substance and Sexual Activity  . Alcohol use: No  . Drug use: No  . Sexual activity: Not Currently  Other Topics Concern  . Not on file  Social History Narrative   Married '61   1 son- '65, 1 daughter '63; 6 children (2 adopted)   SO- SOB   Retirement- doing well   Marriage in good health   Patient has never smoked   Alcohol use- no   Pt gets regular exercise         Social Determinants of Health   Financial Resource Strain: Not on file  Food Insecurity: Not on file  Transportation Needs: Not on file  Physical Activity: Not on file  Stress: Not on file  Social Connections: Not on file    Family History  Problem Relation Age of Onset  . Bladder Cancer Father   . Diabetes Father   . Prostate cancer Father   . Alzheimer's disease Mother   . Diabetes Sister   . Lung cancer Sister         smoker  . Esophageal cancer Paternal Uncle   . Colon cancer Neg Hx     Review of Systems  Constitutional: Positive for fatigue. Negative for appetite  change, chills and fever.  HENT: Positive for voice change (from GERD). Negative for congestion, ear pain and sinus pain.   Respiratory: Positive for chest tightness (heavy feeling). Negative for cough, shortness of breath and wheezing.   Cardiovascular: Negative for chest pain, palpitations and leg swelling.  Gastrointestinal: Negative for abdominal pain, blood in stool, constipation, diarrhea and nausea.       Dark stools - tar looking, frequent BM's  Genitourinary: Negative for dysuria and hematuria.  Neurological: Negative for dizziness, light-headedness and headaches.       Objective:   Vitals:   03/05/21 1055  BP: (!) 140/48  Pulse: (!) 44  Temp: 98 F (36.7 C)  SpO2: 94%   BP Readings from Last 3 Encounters:  03/05/21 (!) 140/48  01/10/21 128/60  12/25/20 (!) 120/40   Wt Readings from Last 3 Encounters:  03/05/21 108 lb 14.4 oz (49.4 kg)  01/10/21 107 lb 12.8 oz (48.9 kg)  12/25/20 107 lb (48.5 kg)   Body mass index is 19.29 kg/m.   Physical Exam Constitutional:      General: She is not in acute distress.    Appearance: Normal appearance. She is not ill-appearing.  HENT:     Head: Normocephalic and atraumatic.  Neck:     Vascular: No carotid bruit.  Cardiovascular:     Rate and Rhythm: Normal rate and regular rhythm.     Heart sounds: Murmur (3/6 systolic-most pronounced right sternal border) heard.    Pulmonary:     Effort: Pulmonary effort is normal. No respiratory distress.     Breath sounds: Rales (Bibasilar dry crackles-chronic) present. No wheezing.  Musculoskeletal:     Cervical back: Neck supple. No tenderness.     Right lower leg: No edema.     Left lower leg: No edema.  Lymphadenopathy:  Cervical: No cervical adenopathy.  Skin:    General: Skin is warm and dry.  Neurological:     Mental Status: She is alert.         DG Chest 2 View CLINICAL DATA:  chest tightness, recent PNA, chronic large pericardial effusion  EXAM: CHEST - 2  VIEW  COMPARISON:  Feb 20, 2021  FINDINGS: Unchanged, markedly enlarged cardiac silhouette. There is diffuse interstitial opacities. Persistent left lower lung airspace disease. Trace bilateral pleural effusions. No visible pneumothorax. Bones are unchanged. Postsurgical changes overlying the left breast and axilla.  IMPRESSION: Unchanged markedly enlarged cardiac silhouette suggesting pericardial effusion, with mild pulmonary edema and trace pleural effusions.  Persistent left lower lung airspace disease compatible with infection.  Electronically Signed   By: Maurine Simmering   On: 03/05/2021 12:14    Lab Results  Component Value Date   WBC 7.8 03/05/2021   HGB 9.3 (L) 03/05/2021   HCT 28.2 (L) 03/05/2021   PLT 289.0 03/05/2021   GLUCOSE 122 (H) 03/05/2021   CHOL 158 06/24/2018   TRIG 112.0 06/24/2018   HDL 57.50 06/24/2018   LDLCALC 78 06/24/2018   ALT 11 03/05/2021   AST 18 03/05/2021   NA 136 03/05/2021   K 4.2 03/05/2021   CL 99 03/05/2021   CREATININE 1.24 (H) 03/05/2021   BUN 29 (H) 03/05/2021   CO2 29 03/05/2021   TSH 4.49 12/17/2018   INR 1.11 04/03/2011   HGBA1C 5.2 01/06/2020      Assessment & Plan:    Pneumonia: Subacute Completed a Z-Pak She is experiencing some chest tightness and is concerned the pneumonia may not be completely treated Chest x-ray a few weeks ago showed chronic pleural effusion-unchanged, vascular congestion suggestive of heart failure, small bilateral pleural effusions Recheck chest x-ray to see if it has worsened-still expect to see some residual pneumonia Check BNP given findings of possible heart failure on chest x-ray  Chest tightness: ?  Cardiac versus pulmonary She follows with [facility] and pulmonary and recently saw [facility] Her prednisone dose was decreased to every other day last week to improve immunocompromised status ordered BNP given the findings on her last chest x-ray suggestive of pulm edema - ?  Diastolic dysfunction  Chronic anemia: States this is related to her pulmonary fibrosis medication Given her increased fatigue we will recheck CBC, CMP    Called patient to review chest x-ray results and lab work.  Blood work indicates possible heart failure-looks like she has had this in the past and at 1 point was on furosemide.  Will start furosemide 20 mg daily x3 days.  We will have her follow-up with her PCP on Friday to reevaluate   This visit occurred during the SARS-CoV-2 public health emergency.  Safety protocols were in place, including screening questions prior to the visit, additional usage of staff PPE, and extensive cleaning of exam room while observing appropriate contact time as indicated for disinfecting solutions.

## 2021-03-05 NOTE — Telephone Encounter (Signed)
   Patient calling to report " she feels funny" Patient states she is having trouble breathing  Call transferred to [facility]

## 2021-03-05 NOTE — Telephone Encounter (Signed)
[facility] FYI:  Caller states she has trouble breathing, states just heavy feeling in chest. Caller states she saw Dr on the 4th and states PCP thought it could be pneumonia and called abx. Caller states she finished abx and thinks she needs another abx. Decreased appetite also.   Advised see PCP within 4 hours, patient understood

## 2021-03-08 ENCOUNTER — Encounter: Payer: Self-pay | Admitting: Internal Medicine

## 2021-03-08 ENCOUNTER — Other Ambulatory Visit: Payer: Self-pay

## 2021-03-08 ENCOUNTER — Ambulatory Visit (INDEPENDENT_AMBULATORY_CARE_PROVIDER_SITE_OTHER): Payer: Medicare PPO | Admitting: Internal Medicine

## 2021-03-08 VITALS — BP 128/72 | HR 71 | Temp 98.0°F | Resp 18 | Ht 63.0 in | Wt 108.4 lb

## 2021-03-08 DIAGNOSIS — N1831 Chronic kidney disease, stage 3a: Secondary | ICD-10-CM | POA: Diagnosis not present

## 2021-03-08 DIAGNOSIS — L720 Epidermal cyst: Secondary | ICD-10-CM | POA: Diagnosis not present

## 2021-03-08 DIAGNOSIS — J189 Pneumonia, unspecified organism: Secondary | ICD-10-CM

## 2021-03-08 DIAGNOSIS — J849 Interstitial pulmonary disease, unspecified: Secondary | ICD-10-CM | POA: Diagnosis not present

## 2021-03-08 DIAGNOSIS — I313 Pericardial effusion (noninflammatory): Secondary | ICD-10-CM

## 2021-03-08 DIAGNOSIS — R0602 Shortness of breath: Secondary | ICD-10-CM | POA: Insufficient documentation

## 2021-03-08 DIAGNOSIS — I3139 Other pericardial effusion (noninflammatory): Secondary | ICD-10-CM

## 2021-03-08 LAB — BASIC METABOLIC PANEL
BUN: 28 mg/dL — ABNORMAL HIGH (ref 6–23)
CO2: 29 mEq/L (ref 19–32)
Calcium: 9 mg/dL (ref 8.4–10.5)
Chloride: 99 mEq/L (ref 96–112)
Creatinine, Ser: 1.35 mg/dL — ABNORMAL HIGH (ref 0.40–1.20)
GFR: 36.59 mL/min — ABNORMAL LOW (ref >60.00)
Glucose, Bld: 90 mg/dL (ref 70–99)
Potassium: 4.1 mEq/L (ref 3.5–5.1)
Sodium: 136 mEq/L (ref 135–145)

## 2021-03-08 LAB — BRAIN NATRIURETIC PEPTIDE: Pro B Natriuretic peptide (BNP): 1554 pg/mL — ABNORMAL HIGH (ref 0.0–100.0)

## 2021-03-08 NOTE — Progress Notes (Signed)
   Subjective:   Patient ID: Paula Ward, female    DOB: [birth year], 85 y.o.   MRN: [id number]  HPI The patient is an 85 YO female coming in for concerns about chest tightness and worsening SOB from usual. Treated for pneumonia with z-pack. This did not help saw pulmonary and they reduced dosing of her prednisone to help with her immune system. Then started having chest tightness. Saw another provider here with repeat CXR and labs showed potential for CHF flare and given 3 days of lasix. She has taken this and feels like the chest tightness is improving some. She is not sure if lasix helped but maybe. This did make her very tired and she did urinate more.  Review of Systems  Constitutional: Negative.   HENT: Negative.   Eyes: Negative.   Respiratory: Positive for chest tightness and shortness of breath. Negative for cough.   Cardiovascular: Negative for chest pain, palpitations and leg swelling.  Gastrointestinal: Negative for abdominal distention, abdominal pain, constipation, diarrhea, nausea and vomiting.  Musculoskeletal: Negative.   Skin: Negative.   Neurological: Negative.   Psychiatric/Behavioral: Negative.     Objective:  Physical Exam Constitutional:      Appearance: She is well-developed.  HENT:     Head: Normocephalic and atraumatic.  Cardiovascular:     Rate and Rhythm: Normal rate and regular rhythm.     Heart sounds: Murmur heard.    Pulmonary:     Effort: Pulmonary effort is normal. No respiratory distress.     Breath sounds: Rhonchi present. No wheezing or rales.     Comments: Stable lung exam Abdominal:     General: Bowel sounds are normal. There is no distension.     Palpations: Abdomen is soft.     Tenderness: There is no abdominal tenderness. There is no rebound.  Musculoskeletal:     Cervical back: Normal range of motion.  Skin:    General: Skin is warm and dry.  Neurological:     Mental Status: She is alert and oriented to person, place, and time.      Coordination: Coordination normal.     Vitals:   03/08/21 0840  BP: 128/72  Pulse: 71  Resp: 18  Temp: 98 F (36.7 C)  TempSrc: Oral  SpO2: 94%  Weight: 108 lb 6.4 oz (49.2 kg)  Height: _0  (1.6 m)    This visit occurred during the SARS-CoV-2 public health emergency.  Safety protocols were in place, including screening questions prior to the visit, additional usage of staff PPE, and extensive cleaning of exam room while observing appropriate contact time as indicated for disinfecting solutions.   Assessment & Plan:

## 2021-03-08 NOTE — Assessment & Plan Note (Signed)
Checking BMP to ensure no change from recent lasix.

## 2021-03-08 NOTE — Assessment & Plan Note (Signed)
Checking BNP, BMP, and CBC. It is unclear if the lasix has helped a lot. The pericardial effusion is likely causing high BNP and likely will not be amenable to diuresis. Her CAP clinically appears to be improving. We will adjust as needed by labs and repeat CXR in 2 weeks. Adjust sooner if needed.

## 2021-03-08 NOTE — Assessment & Plan Note (Signed)
She is currently weaning off prednisone. They are also talking about changing her opsumit.

## 2021-03-08 NOTE — Assessment & Plan Note (Signed)
This may be the cause of high BNP. Rechecking today and assess if change in lasix is needed.

## 2021-03-08 NOTE — Patient Instructions (Signed)
We will recheck the labs today and let you know the plan.  We will plan to check the chest x-ray around 03/19/21 if you are doing okay. If doing worse before then come for the chest x-ray sooner.

## 2021-03-08 NOTE — Assessment & Plan Note (Signed)
Will recheck CXR in 2 weeks, sooner if decline in clinical status.

## 2021-03-09 ENCOUNTER — Encounter: Payer: Self-pay | Admitting: Internal Medicine

## 2021-03-22 ENCOUNTER — Encounter: Payer: Self-pay | Admitting: Internal Medicine

## 2021-03-22 ENCOUNTER — Ambulatory Visit (INDEPENDENT_AMBULATORY_CARE_PROVIDER_SITE_OTHER): Payer: Medicare PPO

## 2021-03-22 DIAGNOSIS — J189 Pneumonia, unspecified organism: Secondary | ICD-10-CM | POA: Diagnosis not present

## 2021-03-22 DIAGNOSIS — J90 Pleural effusion, not elsewhere classified: Secondary | ICD-10-CM | POA: Diagnosis not present

## 2021-03-27 ENCOUNTER — Telehealth: Payer: Self-pay | Admitting: Gastroenterology

## 2021-03-27 NOTE — Telephone Encounter (Signed)
Inbound call from pt requesting a call back. She is concerned because she is having dark stools. Please advise. Thank you

## 2021-03-28 ENCOUNTER — Emergency Department (HOSPITAL_BASED_OUTPATIENT_CLINIC_OR_DEPARTMENT_OTHER): Payer: Medicare PPO

## 2021-03-28 ENCOUNTER — Other Ambulatory Visit: Payer: Self-pay

## 2021-03-28 ENCOUNTER — Encounter (HOSPITAL_BASED_OUTPATIENT_CLINIC_OR_DEPARTMENT_OTHER): Payer: Self-pay | Admitting: *Deleted

## 2021-03-28 ENCOUNTER — Inpatient Hospital Stay (HOSPITAL_BASED_OUTPATIENT_CLINIC_OR_DEPARTMENT_OTHER)
Admission: EM | Admit: 2021-03-28 | Discharge: 2021-04-01 | DRG: 377 | Disposition: A | Discharge status: Home / Self Care | Payer: Medicare PPO | Attending: Internal Medicine | Admitting: Internal Medicine

## 2021-03-28 DIAGNOSIS — R627 Adult failure to thrive: Secondary | ICD-10-CM | POA: Diagnosis present

## 2021-03-28 DIAGNOSIS — K921 Melena: Secondary | ICD-10-CM | POA: Diagnosis not present

## 2021-03-28 DIAGNOSIS — Z881 Allergy status to other antibiotic agents status: Secondary | ICD-10-CM

## 2021-03-28 DIAGNOSIS — Z853 Personal history of malignant neoplasm of breast: Secondary | ICD-10-CM

## 2021-03-28 DIAGNOSIS — I272 Pulmonary hypertension, unspecified: Secondary | ICD-10-CM | POA: Diagnosis present

## 2021-03-28 DIAGNOSIS — Z86711 Personal history of pulmonary embolism: Secondary | ICD-10-CM | POA: Diagnosis not present

## 2021-03-28 DIAGNOSIS — I5033 Acute on chronic diastolic (congestive) heart failure: Secondary | ICD-10-CM | POA: Diagnosis present

## 2021-03-28 DIAGNOSIS — I358 Other nonrheumatic aortic valve disorders: Secondary | ICD-10-CM | POA: Diagnosis not present

## 2021-03-28 DIAGNOSIS — J309 Allergic rhinitis, unspecified: Secondary | ICD-10-CM | POA: Diagnosis present

## 2021-03-28 DIAGNOSIS — D62 Acute posthemorrhagic anemia: Secondary | ICD-10-CM | POA: Diagnosis present

## 2021-03-28 DIAGNOSIS — N1831 Chronic kidney disease, stage 3a: Secondary | ICD-10-CM | POA: Diagnosis present

## 2021-03-28 DIAGNOSIS — Z885 Allergy status to narcotic agent status: Secondary | ICD-10-CM | POA: Diagnosis not present

## 2021-03-28 DIAGNOSIS — M349 Systemic sclerosis, unspecified: Secondary | ICD-10-CM | POA: Diagnosis present

## 2021-03-28 DIAGNOSIS — I130 Hypertensive heart and chronic kidney disease with heart failure and stage 1 through stage 4 chronic kidney disease, or unspecified chronic kidney disease: Secondary | ICD-10-CM | POA: Diagnosis present

## 2021-03-28 DIAGNOSIS — R0902 Hypoxemia: Secondary | ICD-10-CM

## 2021-03-28 DIAGNOSIS — Z79899 Other long term (current) drug therapy: Secondary | ICD-10-CM | POA: Diagnosis not present

## 2021-03-28 DIAGNOSIS — I5020 Unspecified systolic (congestive) heart failure: Secondary | ICD-10-CM | POA: Diagnosis not present

## 2021-03-28 DIAGNOSIS — K922 Gastrointestinal hemorrhage, unspecified: Secondary | ICD-10-CM | POA: Diagnosis present

## 2021-03-28 DIAGNOSIS — K31811 Angiodysplasia of stomach and duodenum with bleeding: Secondary | ICD-10-CM | POA: Diagnosis not present

## 2021-03-28 DIAGNOSIS — K5521 Angiodysplasia of colon with hemorrhage: Principal | ICD-10-CM | POA: Diagnosis present

## 2021-03-28 DIAGNOSIS — D649 Anemia, unspecified: Secondary | ICD-10-CM | POA: Diagnosis not present

## 2021-03-28 DIAGNOSIS — K219 Gastro-esophageal reflux disease without esophagitis: Secondary | ICD-10-CM | POA: Diagnosis present

## 2021-03-28 DIAGNOSIS — Z7952 Long term (current) use of systemic steroids: Secondary | ICD-10-CM

## 2021-03-28 DIAGNOSIS — Z8701 Personal history of pneumonia (recurrent): Secondary | ICD-10-CM | POA: Diagnosis not present

## 2021-03-28 DIAGNOSIS — K5520 Angiodysplasia of colon without hemorrhage: Secondary | ICD-10-CM | POA: Diagnosis not present

## 2021-03-28 DIAGNOSIS — I517 Cardiomegaly: Secondary | ICD-10-CM | POA: Diagnosis not present

## 2021-03-28 DIAGNOSIS — R531 Weakness: Secondary | ICD-10-CM

## 2021-03-28 DIAGNOSIS — I313 Pericardial effusion (noninflammatory): Secondary | ICD-10-CM | POA: Diagnosis present

## 2021-03-28 DIAGNOSIS — I10 Essential (primary) hypertension: Secondary | ICD-10-CM | POA: Diagnosis not present

## 2021-03-28 DIAGNOSIS — Z20822 Contact with and (suspected) exposure to covid-19: Secondary | ICD-10-CM | POA: Diagnosis present

## 2021-03-28 DIAGNOSIS — J9601 Acute respiratory failure with hypoxia: Secondary | ICD-10-CM | POA: Diagnosis present

## 2021-03-28 DIAGNOSIS — Z85528 Personal history of other malignant neoplasm of kidney: Secondary | ICD-10-CM | POA: Diagnosis not present

## 2021-03-28 DIAGNOSIS — R7989 Other specified abnormal findings of blood chemistry: Secondary | ICD-10-CM | POA: Diagnosis present

## 2021-03-28 DIAGNOSIS — R918 Other nonspecific abnormal finding of lung field: Secondary | ICD-10-CM | POA: Diagnosis present

## 2021-03-28 DIAGNOSIS — D631 Anemia in chronic kidney disease: Secondary | ICD-10-CM | POA: Diagnosis present

## 2021-03-28 DIAGNOSIS — R778 Other specified abnormalities of plasma proteins: Secondary | ICD-10-CM | POA: Diagnosis present

## 2021-03-28 DIAGNOSIS — J811 Chronic pulmonary edema: Secondary | ICD-10-CM | POA: Diagnosis not present

## 2021-03-28 DIAGNOSIS — Z888 Allergy status to other drugs, medicaments and biological substances status: Secondary | ICD-10-CM

## 2021-03-28 DIAGNOSIS — J849 Interstitial pulmonary disease, unspecified: Secondary | ICD-10-CM | POA: Diagnosis present

## 2021-03-28 DIAGNOSIS — I5032 Chronic diastolic (congestive) heart failure: Secondary | ICD-10-CM | POA: Diagnosis present

## 2021-03-28 DIAGNOSIS — J90 Pleural effusion, not elsewhere classified: Secondary | ICD-10-CM | POA: Diagnosis not present

## 2021-03-28 LAB — CBC WITH DIFFERENTIAL/PLATELET
Abs Immature Granulocytes: 0.06 10*3/uL (ref 0.00–0.07)
Basophils Absolute: 0 10*3/uL (ref 0.0–0.1)
Basophils Relative: 0 %
Eosinophils Absolute: 0 10*3/uL (ref 0.0–0.5)
Eosinophils Relative: 0 %
HCT: 22.6 % — ABNORMAL LOW (ref 36.0–46.0)
Hemoglobin: 7.1 g/dL — ABNORMAL LOW (ref 12.0–15.0)
Immature Granulocytes: 1 %
Lymphocytes Relative: 13 %
Lymphs Abs: 1 10*3/uL (ref 0.7–4.0)
MCH: 34.3 pg — ABNORMAL HIGH (ref 26.0–34.0)
MCHC: 31.4 g/dL (ref 30.0–36.0)
MCV: 109.2 fL — ABNORMAL HIGH (ref 80.0–100.0)
Monocytes Absolute: 0.4 10*3/uL (ref 0.1–1.0)
Monocytes Relative: 5 %
Neutro Abs: 6.1 10*3/uL (ref 1.7–7.7)
Neutrophils Relative %: 81 %
Platelets: 324 10*3/uL (ref 150–400)
RBC: 2.07 MIL/uL — ABNORMAL LOW (ref 3.87–5.11)
RDW: 16.4 % — ABNORMAL HIGH (ref 11.5–15.5)
WBC: 7.6 10*3/uL (ref 4.0–10.5)
nRBC: 0 % (ref 0.0–0.2)

## 2021-03-28 LAB — HEMOGLOBIN AND HEMATOCRIT, BLOOD
HCT: 21.1 % — ABNORMAL LOW (ref 36.0–46.0)
Hemoglobin: 6.4 g/dL — CL (ref 12.0–15.0)

## 2021-03-28 LAB — OCCULT BLOOD X 1 CARD TO LAB, STOOL: Fecal Occult Bld: POSITIVE — AB

## 2021-03-28 LAB — COMPREHENSIVE METABOLIC PANEL
ALT: 13 U/L (ref 0–44)
AST: 24 U/L (ref 15–41)
Albumin: 4 g/dL (ref 3.5–5.0)
Alkaline Phosphatase: 36 U/L — ABNORMAL LOW (ref 38–126)
Anion gap: 9 (ref 5–15)
BUN: 28 mg/dL — ABNORMAL HIGH (ref 8–23)
CO2: 25 mmol/L (ref 22–32)
Calcium: 8.8 mg/dL — ABNORMAL LOW (ref 8.9–10.3)
Chloride: 100 mmol/L (ref 98–111)
Creatinine, Ser: 1.32 mg/dL — ABNORMAL HIGH (ref 0.44–1.00)
GFR, Estimated: 40 mL/min — ABNORMAL LOW (ref >60)
Glucose, Bld: 103 mg/dL — ABNORMAL HIGH (ref 70–99)
Potassium: 4.2 mmol/L (ref 3.5–5.1)
Sodium: 134 mmol/L — ABNORMAL LOW (ref 135–145)
Total Bilirubin: 0.5 mg/dL (ref 0.3–1.2)
Total Protein: 6.5 g/dL (ref 6.5–8.1)

## 2021-03-28 LAB — URINALYSIS, ROUTINE W REFLEX MICROSCOPIC
Bilirubin Urine: NEGATIVE
Glucose, UA: NEGATIVE mg/dL
Hgb urine dipstick: NEGATIVE
Ketones, ur: 5 mg/dL — AB
Leukocytes,Ua: NEGATIVE
Nitrite: NEGATIVE
Protein, ur: NEGATIVE mg/dL
Specific Gravity, Urine: 1.008 (ref 1.005–1.030)
pH: 6 (ref 5.0–8.0)

## 2021-03-28 LAB — RETICULOCYTES
Immature Retic Fract: 28.5 % — ABNORMAL HIGH (ref 2.3–15.9)
RBC.: 1.88 MIL/uL — ABNORMAL LOW (ref 3.87–5.11)
Retic Count, Absolute: 92.3 10*3/uL (ref 19.0–186.0)
Retic Ct Pct: 4.9 % — ABNORMAL HIGH (ref 0.4–3.1)

## 2021-03-28 LAB — TROPONIN I (HIGH SENSITIVITY)
Troponin I (High Sensitivity): 26 ng/L — ABNORMAL HIGH (ref <18)
Troponin I (High Sensitivity): 25 ng/L — ABNORMAL HIGH (ref <18)

## 2021-03-28 LAB — PROTIME-INR
INR: 1 (ref 0.8–1.2)
Prothrombin Time: 13.6 seconds (ref 11.4–15.2)

## 2021-03-28 LAB — FOLATE: Folate: 62.6 ng/mL (ref >5.9)

## 2021-03-28 LAB — BRAIN NATRIURETIC PEPTIDE: B Natriuretic Peptide: 2011.1 pg/mL — ABNORMAL HIGH (ref 0.0–100.0)

## 2021-03-28 LAB — PREPARE RBC (CROSSMATCH)

## 2021-03-28 LAB — RESP PANEL BY RT-PCR (FLU A&B, COVID) ARPGX2
Influenza A by PCR: NEGATIVE
Influenza B by PCR: NEGATIVE
SARS Coronavirus 2 by RT PCR: NEGATIVE

## 2021-03-28 LAB — TSH: TSH: 5.967 u[IU]/mL — ABNORMAL HIGH (ref 0.350–4.500)

## 2021-03-28 MED ORDER — ACETAMINOPHEN 325 MG PO TABS: 650.0000 mg | ORAL_TABLET | Freq: Four times a day (QID) | ORAL | Status: DC | PRN

## 2021-03-28 MED ORDER — ALBUTEROL SULFATE (2.5 MG/3ML) 0.083% IN NEBU: 3.0000 mL | INHALATION_SOLUTION | Freq: Four times a day (QID) | RESPIRATORY_TRACT | Status: DC | PRN

## 2021-03-28 MED ORDER — FUROSEMIDE 10 MG/ML IJ SOLN
20.0000 mg | Freq: Once | INTRAMUSCULAR | Status: AC
  Administered 2021-03-29: 20 mg via INTRAVENOUS
  Filled 2021-03-28: qty 2

## 2021-03-28 MED ORDER — ACETAMINOPHEN 650 MG RE SUPP: 650.0000 mg | Freq: Four times a day (QID) | RECTAL | Status: DC | PRN

## 2021-03-28 MED ORDER — LORAZEPAM 2 MG/ML IJ SOLN: 0.5000 mg | Freq: Four times a day (QID) | INTRAMUSCULAR | Status: DC | PRN

## 2021-03-28 MED ORDER — MACITENTAN 10 MG PO TABS
10.0000 mg | ORAL_TABLET | Freq: Every day | ORAL | Status: DC
  Administered 2021-03-29 – 2021-04-01 (×4): 10 mg via ORAL
  Filled 2021-03-28 (×4): qty 1

## 2021-03-28 MED ORDER — SODIUM CHLORIDE 0.9% IV SOLUTION: Freq: Once | INTRAVENOUS | Status: AC

## 2021-03-28 MED ORDER — FLUTICASONE PROPIONATE 50 MCG/ACT NA SUSP
1.0000 | Freq: Every day | NASAL | Status: DC
  Administered 2021-03-29 – 2021-04-01 (×4): 1 via NASAL
  Filled 2021-03-28 (×2): qty 16

## 2021-03-28 MED ORDER — LIP MEDEX EX OINT
TOPICAL_OINTMENT | CUTANEOUS | Status: DC | PRN
  Filled 2021-03-28: qty 7

## 2021-03-28 MED ORDER — SODIUM CHLORIDE 0.9 % IV SOLN
8.0000 mg/h | INTRAVENOUS | Status: DC
  Administered 2021-03-28 – 2021-03-29 (×2): 8 mg/h via INTRAVENOUS
  Filled 2021-03-28 (×4): qty 80

## 2021-03-28 MED ORDER — MONTELUKAST SODIUM 10 MG PO TABS
10.0000 mg | ORAL_TABLET | Freq: Every day | ORAL | Status: DC
  Administered 2021-03-29 – 2021-03-31 (×3): 10 mg via ORAL
  Filled 2021-03-28 (×3): qty 1

## 2021-03-28 MED ORDER — PREDNISONE 1 MG PO TABS
1.0000 mg | ORAL_TABLET | ORAL | Status: DC
  Administered 2021-03-29 – 2021-04-01 (×10): 1 mg via ORAL
  Filled 2021-03-28 (×14): qty 1

## 2021-03-28 NOTE — ED Notes (Signed)
Telephone report called Jahcer, RN, 5E at [facility].  Pt is going to room 1503.

## 2021-03-28 NOTE — ED Notes (Signed)
Pt. Husband called and informed of Pt. Transport to [facility] and given room number

## 2021-03-28 NOTE — ED Notes (Signed)
Provider bedside.

## 2021-03-28 NOTE — Telephone Encounter (Signed)
Left message on machine to call back

## 2021-03-28 NOTE — ED Provider Notes (Signed)
[facility] EMERGENCY DEPARTMENT Provider Note   CSN: [id number] Arrival date & time: 03/28/21  1254     History Chief Complaint  Patient presents with   Abdominal Pain   Melena    Paula Ward is a 85 y.o. female.  Patient presents with generalized fatigue and weakness over the last 3 weeks.  Patient reports that she was walking around yesterday was having difficulty due to the weakness so felt like she needed to be checked out.  Recently diagnosed with pneumonia but had follow-up chest x-ray showing resolution of the pneumonia.  She complains of dark stools for the last 3 weeks as well.  She reports he takes a multivitamin with iron in it and has taken fiber for her stools but has not taken any other medications for her bowels.  She is concerned that she may have some kind of bleed in her stomach.  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  She has required blood transfusions.  Baseline hemoglobin is approximately 9.  She has history of a large pericardial effusion.      Past Medical History:  Diagnosis Date   Anemia    Angiodysplasia of stomach and duodenum    Arthus phenomenon    AVM (arteriovenous malformation)    Blood transfusion without reported diagnosis    last 4 units 12-22-15, Iron infusion x2 last -01-07-16,01-14-16.   Breast cancer ([place]) 1989   Left   Candida esophagitis (HCC)    Cataract    Chronic kidney disease    Chronic mild renal insuffiency   CREST syndrome (HCC)    GERD (gastroesophageal reflux disease)    w/ HH   Interstitial lung disease (HCC)    Nodule of kidney    PONV (postoperative nausea and vomiting)    Pulmonary embolus ([place]) 2003   Pulmonary hypertension ([place])    followed by Dr Gaynell Face at [facility], now Dr. Maryjean Ka visit end 2'17.   Rectal incontinence    Renal cell carcinoma (HCC)    Scleroderma (HCC)    Tubular adenoma of colon    Uterine polyp     Patient Active Problem List   Diagnosis Date  Noted   SOB (shortness of breath) on exertion 03/08/2021   Anemia, chronic disease 12/25/2020   Fatigue 12/25/2020   Hoarseness 07/24/2020   Drowsiness 12/17/2018   Leg swelling 09/03/2018   Orthopnea 06/24/2018   Dry mouth 02/15/2018   Allergic rhinitis 11/05/2017   Cerebellar 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 ([place]) 10/26/2017   Routine general medical examination at a health care facility 01/30/2017   Community acquired pneumonia 08/19/2016   Pericardial effusion 01/21/2016   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[place])    Osteoarthritis 02/25/2015   Mass of left side of neck    CKD (chronic kidney disease) stage 3, GFR 30-59 ml/min (HCC) 09/01/2012   RECTAL INCONTINENCE 03/27/2009   Angiodysplasia of stomach and duodenum 08/22/2008   ADENOCARCINOMA, BREAST, LEFT 07/26/2007   Anemia, iron deficiency 07/26/2007   Essential hypertension 07/26/2007   Pulmonary hypertension ([place]) 07/26/2007   GASTROESOPHAGEAL REFLUX DISEASE 07/26/2007   SCLERODERMA 07/26/2007   ILD (interstitial lung disease) ([place]) 07/26/2007   PULMONARY EMBOLISM, HX OF 07/26/2007    Past Surgical History:  Procedure Laterality Date   APPENDECTOMY  1962   BREAST LUMPECTOMY  1989   left   CATARACT EXTRACTION, BILATERAL Bilateral 12/2013   ENTEROSCOPY N/A 01/18/2016   Procedure: ENTEROSCOPY;  Surgeon: Mauri Pole, MD;  Location: [facility];  Service: Endoscopy;  Laterality: N/A;   ENTEROSCOPY N/A 05/24/2018   Procedure: ENTEROSCOPY;  Surgeon: Jackquline Denmark, MD;  Location: [facility] END[facility];  Service: Endoscopy;  Laterality: N/A;   ESOPHAGOGASTRODUODENOSCOPY (EGD) WITH PROPOFOL N/A 12/21/2015   Procedure: ESOPHAGOGASTRODUODENOSCOPY (EGD) WITH PROPOFOL;  Surgeon: Irene Shipper, MD;  Location: [facility];  Service: Endoscopy;  Laterality: N/A;   HOT HEMOSTASIS N/A 05/24/2018   Procedure: HOT HEMOSTASIS (ARGON PLASMA COAGULATION/BICAP);  Surgeon: Jackquline Denmark, MD;  Location: [facility];  Service: Endoscopy;   Laterality: N/A;   IR GENERIC HISTORICAL  06/05/2016   IR RADIOLOGIST EVAL & MGMT 06/05/2016 Aletta Edouard, MD [facility] INTERV RAD   IVC Filter     KIDNEY SURGERY     left -"laser surgery by Dr. Kathlene Cote- 5 yrs ago" no removal   SCHLEROTHERAPY  05/24/2018   Procedure: SCHLEROTHERAPY;  Surgeon: Jackquline Denmark, MD;  Location: [facility];  Service: Endoscopy;;   TONSILLECTOMY     TUBAL LIGATION       OB History   No obstetric history on file.     Family History  Problem Relation Age of Onset   Bladder Cancer Father    Diabetes Father    Prostate cancer Father    Alzheimer's disease Mother    Diabetes Sister    Lung cancer Sister         smoker   Esophageal cancer Paternal Uncle    Colon cancer Neg Hx     Social History   Tobacco Use   Smoking status: Never   Smokeless tobacco: Never  Vaping Use   Vaping Use: Never used  Substance Use Topics   Alcohol use: No   Drug use: No    Home Medications Prior to Admission medications   Medication Sig Start Date End Date Taking? Authorizing Provider  ACCU-CHEK AVIVA PLUS test strip  06/04/20   [provider]  acetaminophen (TYLENOL) 325 MG tablet Take 650 mg by mouth every 6 (six) hours as needed for mild pain.     [provider]  albuterol (VENTOLIN HFA) 108 (90 Base) MCG/ACT inhaler INHALE 2 PUFFS INTO THE LUNGS EVERY 6 HOURS AS NEEDED FOR WHEEZING OR SHORTNESS OF BREATH 03/06/20   Hoyt Koch, MD  ALPRAZolam Duanne Moron) 0.25 MG tablet Take 0.5 tablets (0.125 mg total) by mouth at bedtime as needed. 10/29/20   Hoyt Koch, MD  atovaquone [facility]) 750 MG/5ML suspension  07/10/20   [provider]  bacitracin ointment Apply 1 application topically 2 (two) times daily. 10/10/18   Charlesetta Shanks, MD  diltiazem (CARDIZEM CD) 120 MG 24 hr capsule Take 120 mg by mouth daily.    [provider]  fluticasone (FLONASE) 50 MCG/ACT nasal spray INSTILL 1 SPRAY INTO EACH NOSTRIL TWICE A DAY IF  NEEDED 09/18/20   Hoyt Koch, MD  furosemide (LASIX) 20 MG tablet Take 1 tablet (20 mg total) by mouth daily. 03/05/21   Binnie Rail, MD  lidocaine (XYLOCAINE) 2 % solution Use as directed 5 mLs in the mouth or throat every 3 (three) hours as needed for mouth pain. 10/19/18   Hoyt Koch, MD  meclizine (ANTIVERT) 12.5 MG tablet Take 1-2 tablets (12.5-25 mg total) by mouth 3 (three) times daily as needed for dizziness. 10/26/17   Binnie Rail, MD  montelukast (SINGULAIR) 10 MG tablet TAKE 1 TABLET(10 MG) BY MOUTH AT BEDTIME 01/07/21   Hoyt Koch, MD  Mouthwashes (DRY MOUTH MOUTHWASH) LIQD  Use as directed 5 mLs in the mouth or throat as needed (dry mouth). 02/15/18   Hoyt Koch, MD  Multiple Vitamin (MULTIVITAMIN WITH MINERALS) TABS tablet Take 1 tablet by mouth daily. NO IRON    [provider]  mupirocin cream (BACTROBAN) 2 % Apply 1 application topically 2 (two) times daily. 03/22/18   Binnie Rail, MD  omeprazole (PRILOSEC) 40 MG capsule TAKE 1 CAPSULE(40 MG) BY MOUTH DAILY 08/13/20   Nandigam, Venia Minks, MD  OPSUMIT 10 MG TABS Take 1 tablet by mouth daily.  08/16/15   [provider]  pantoprazole (PROTONIX) 40 MG tablet TAKE 1 TABLET(40 MG) BY MOUTH TWICE DAILY 12/24/20   Mauri Pole, MD  pilocarpine (SALAGEN) 5 MG tablet TAKE 1 TABLET(5 MG) BY MOUTH THREE TIMES DAILY AS NEEDED 12/19/19   Hoyt Koch, MD  predniSONE (DELTASONE) 2.5 MG tablet Take 2.5 mg by mouth daily with breakfast.    [provider]  spironolactone (ALDACTONE) 25 MG tablet  06/09/20   [provider]  sucralfate (CARAFATE) 1 GM/10ML suspension TAKE 10 MLS BY MOUTH AT BEDTIME 09/17/20   Nandigam, Venia Minks, MD  tadalafil, PAH, (ADCIRCA) 20 MG tablet Take by mouth. 03/05/21   [provider]    Allergies    Codeine, Other, Erythromycin, and Lisinopril  Review of Systems   Review of Systems  Constitutional:  Positive for  fatigue. Negative for chills and fever.  HENT:  Negative for congestion and rhinorrhea.   Eyes:  Negative for visual disturbance.  Respiratory:  Positive for shortness of breath. Negative for cough.   Cardiovascular:  Negative for chest pain.  Gastrointestinal:  Positive for abdominal pain. Negative for constipation.       Right lower quadrant abdominal pain.  S/p appendectomy many years ago.  Endocrine: Negative for polyuria.  Genitourinary:  Negative for dysuria.  Musculoskeletal:  Positive for myalgias.  Skin:  Positive for rash.  Neurological:  Positive for dizziness. Negative for headaches.   Physical Exam Updated Vital Signs BP (!) 146/70 (BP Location: Left Arm)   Pulse 84   Temp 98.2 F (36.8 C) (Oral)   Resp 20   Ht _0  (1.6 m)   Wt 50.6 kg   SpO2 92%   BMI 19.76 kg/m   Physical Exam Constitutional:      Appearance: She is ill-appearing.  HENT:     Head: Normocephalic and atraumatic.     Mouth/Throat:     Mouth: Mucous membranes are moist.     Pharynx: Oropharynx is clear.  Eyes:     Extraocular Movements: Extraocular movements intact.     Pupils: Pupils are equal, round, and reactive to light.  Cardiovascular:     Rate and Rhythm: Normal rate and regular rhythm.     Heart sounds: Murmur (Systolic) heard.  Pulmonary:     Effort: Pulmonary effort is normal.     Comments: Decreased lung sounds diffusely, crackles in lung bases bilaterally uses appreciated Abdominal:     General: Bowel sounds are normal. There is no distension.     Palpations: Abdomen is soft.     Tenderness: There is abdominal tenderness in the right lower quadrant.     Hernia: No hernia is present.  Skin:    General: Skin is warm and dry.     Capillary Refill: Capillary refill takes less than 2 seconds.  Neurological:     General: No focal deficit present.     Mental Status:  She is alert.    ED Results / Procedures / Treatments   Labs (all labs ordered are listed, but only abnormal  results are displayed) Labs Reviewed - No data to display  EKG None  Radiology No results found.  Procedures Procedures   Medications Ordered in ED Medications - No data to display  ED Course  I have reviewed the triage vital signs and the nursing notes.  Pertinent labs & imaging results that were available during my care of the patient were reviewed by me and considered in my medical decision making (see chart for details).    MDM Rules/Calculators/A&P                          Patient is an 85 year old female with past medical history of anemia, AVMs in the intestine, history of PE not on anticoagulation, crest syndrome, GERD, interstitial lung disease, who presents with generalized fatigue over the last 3 weeks.  She has been having dark stools and concern that she may have a bleed in her stomach given her history.  She does note that she takes iron supplementation and multivitamins which may cause her stools to be dark.  Most recent colonoscopy was performed in the 19 and showed single bleeding angiodysplastic lesion in the duodenum.  BiCAP followed by endoscopic clipping.  No specimens were collected.  Will to the emergency department vital signs were stable.  Her hemoglobin was checked and was 7.1 from 3 to previous check.  Fecal occult blood was positive.  CMP showed creatinine was elevated but at apparent baseline at 1.32.  Given the patient's symptomatic anemia with positive fecal occult blood we are going to admit the patient for monitoring and possible transfusion.  The patient follows with our GI so they were consulted and reported that they will see the patient once hospitalized.  Hospitalist paged for admission.  Final Clinical Impression(s) / ED Diagnoses Final diagnoses:  None    Rx / DC Orders ED Discharge Orders     None        Gifford Shave, MD 03/28/21 1518    Blanchie Dessert, MD 03/29/21 [id number]    Blanchie Dessert, MD 03/29/21 1553

## 2021-03-28 NOTE — ED Notes (Signed)
Patient is resting comfortably.

## 2021-03-28 NOTE — ED Triage Notes (Signed)
Abdominal pain and black stools x 3 weeks. Weakness.

## 2021-03-28 NOTE — ED Notes (Signed)
Pt. Has CREST disease

## 2021-03-28 NOTE — Progress Notes (Signed)
Brief note regarding preliminary plan, with full H&P to follow:  85 year old female with history of recurrent gastrointestinal bleed in the setting of AVMs, following with low-power gastroenterology as an outpatient, chronic anemia with baseline hemoglobin of approximately 9, GERD, who is admitted to [facility] by way of transfer from [place] emergency department for further evaluation and management of suspected acute upper gastrointestinal bleed after presenting to the latter facility complaining of generalized weakness and recurrence of dark-colored stool that was found to be fecal occult blood positive in the ED.  On-call of our gastroenterology consulted, and will see the patient in the morning, anticipating EGD at that time.  Patient to be NPO.  Presentation is associated with acute on chronic anemia, presenting hemoglobin of 7.1 relative to approximate baseline hemoglobin of 9.  She otherwise asymptomatic and appears hemodynamically stable.  Every 4 hours hemoglobin checks have been ordered through 9 AM tomorrow morning.  Add on INR.  Also added on the following to her presenting labs collected at [facility]: Iron studies, MMA, folic acid, reticulocyte count.  Suspected upper GI source, will start Protonix drip.  Not on any blood thinners as an outpatient.  We will refrain from pharmacologic DVT prophylaxis here.  SCDs. STAT H&H.     Babs Bertin, DO Hospitalist

## 2021-03-28 NOTE — Progress Notes (Signed)
Called from [place]. Patient known to [place], mainly for history of anemia and gastrointestinal AVMs. Medical history not limited to CREST, pulmonary hypertension , renal cell carcinoma , and GERD.    Patient recently treated for PNA, felt weak and went [facility]. Hgb found to be around 7 ( ~ 2 grams below her baseline). Stools dark on EDP 's exam ( not melenic) but FOBT+. Patient is not anticoagulated.   Patient being transferred to [facility] for management of GI bleed. She is hemodynamically stable.  GI will see tomorrow morning. When patient arrives to [facility] please make her NPO after MN,. No pharmacologic DVT prophylaxis. She will like need small bowel endoscopy tomorrow ( if anesthesia support is available)

## 2021-03-28 NOTE — ED Notes (Signed)
Telephone report given to [facility] with [place].

## 2021-03-28 NOTE — ED Notes (Signed)
Pt. Reports she has had dark "rabbit turd" stools for 2-3 weeks.

## 2021-03-28 NOTE — H&P (Signed)
History and Physical    PLEASE NOTE THAT [employer]AGON DICT[employer] SOFTWARE WAS USED IN THE CONSTRUCTION OF THIS NOTE.   JOZY MCPHEARSON UDT:[id number] DOB: 1936-05-28 DOA: 03/28/2021  PCP: Hoyt Koch, MD Patient coming from: home   I have personally briefly reviewed patient's old medical records in [place]  Chief Complaint: Generalized weakness  HPI: BLESSEN KIMBROUGH is a 85 y.o. female with medical history significant for upper gastrointestinal bleeding in the setting of angiodysplasia of the stomach/duodenum as well as gastrointestinal AVMs, chronic anemia associated baseline hemoglobin 8.5-9.5, chronic pericardial effusion, GERD, stage IIIb chronic kidney disease with baseline creatinine 1.2-1.4, crest syndrome, pulmonary hypertension, interstitial lung disease chronic diastolic, who is admitted to [facility] on 03/28/2021 by way of transfer from [place] emergency department for further evaluation management of acute upper gastrointestinal bleed with associated acute on chronic anemia  after presenting from home to the latter facility complaining of generalized weakness.  The patient reports 2 to 3 days of generalized weakness in the absence of any acute focal weakness.  She notes that this has been associated with new development of dark-colored stool over the last 2 to 3 days, reporting 1-2 episodes of such per day over that timeframe, with most recent such episode occurring on the morning of 03/28/2021.  Denies any associated hematochezia.  Denies any associated abdominal pain, nausea, vomiting, hematemesis.  No recent trauma.  She confirms that she is on no blood thinners as an outpatient, including no aspirin.  She reports 1 to 2 days of shortness of breath, but denies any significant orthopnea, PND, or worsening of peripheral edema.  She also denies any recent chest pain, palpitations, diaphoresis, dizziness, presyncope, or syncope.  Not associate with any  recent subjective fever, chills, rigors, or generalized myalgias.  Denies any recent dysuria, gross hematuria, or change in urinary urgency/frequency.  Denies any associated cough or hemoptysis.  Per chart review, the patient has a history of prior gastrointestinal bleeds in the setting of gastrointestinal AVMs as well as angiodysplasia of the stomach/duodenum, and follows with low-power gastroenterology as an outpatient.  It appears that she underwent enteroscopy in August 2019.  Patient denies any regular or recent consumption of alcohol, and denies any known history of underlying chronic liver disease.  No recent use of NSAIDs.  However, in the setting of a history of crest syndrome, she is on chronic systemic corticosteroid therapy.  Denies any use of supplemental potassium or bisphosphonate medications at home.  Per chart review, it appears that the patient has a history of chronic anemia, associated baseline hemoglobin of 8.5-9.5, with most recent prior hemoglobin noted to be 9.3 when checked on 03/05/2021.  She has previously required blood transfusion in the setting of acute gastrointestinal bleed.    [facility] ED Course:  Vital signs in the ED were notable for the following: Temperature max 98.86, heart rate 72-84; blood pressure 138/50 -150/58; respiratory rate 15-22, oxygen saturation 89% on room air, which improved to 98% on 2 L nasal cannula.   Labs were notable for the following: CMP was notable for the following: Sodium 134, bicarbonate 25, BUN 28, unchanged from most recent prior value checked on 03/08/2021, creatinine 1.32 relative to most recent prior value of 1.35 on 03/08/2021, albumin 4.0, and additional liver enzymes were found to be within normal limits.  CBC notable for the following: Hemoglobin 7.1 relative to most recent prior value of 9.3 on 5 72,022, with presenting hemoglobin associated  with macrocytic/normochromic findings as well as elevated RDW.  Rectal exam was associated with  fecal occult positive finding.  BNP 2000 relative to a pro BNP of 1500 in May 2022.  High-sensitivity troponin high initially noted to be 26, with repeat value trending down to 25.  Nasopharyngeal COVID-19/influenza PCR performed at the ED today and found to be negative.  EKG, by way of comparison to most recent prior from 2017, showed sinus rhythm with heart rate 70, prolonged PR interval of 225, T wave flattening in lead I, T wave inversion in AV L, of which both are unchanged relative to most recent prior EKG, while showing no evidence of ST changes, including no evidence of ST elevation.  Chest x-ray, compared to 03/22/2021 showed cardiomegaly with pulmonary vascular congestion potential small left pleural effusion, without overt pulmonary edema or consolidation, or evidence of pneumothorax.  The EDP at [facility] consulted the on-call of our gastroenterologist, who will formally consult, and plans to see the patient in the morning, at which time endoscopic evaluation is anticipated.  With our gastroenterology request that the patient be made n.p.o.  No blood transfusion, IV fluids, or additional medications were conveyed at [facility] prior to transfer to [facility] for further evaluation management of suspected presenting acute upper gastrointestinal bleed.    Review of Systems: As per HPI otherwise 10 point review of systems negative.   Past Medical History:  Diagnosis Date   Anemia    Angiodysplasia of stomach and duodenum    Arthus phenomenon    AVM (arteriovenous malformation)    Blood transfusion without reported diagnosis    last 4 units 12-22-15, Iron infusion x2 last -01-07-16,01-14-16.   Breast cancer ([place]) 1989   Left   Candida esophagitis (HCC)    Cataract    Chronic kidney disease    Chronic mild renal insuffiency   CREST syndrome (HCC)    GERD (gastroesophageal reflux disease)    w/ HH   Interstitial lung disease (HCC)    Nodule of kidney    PONV  (postoperative nausea and vomiting)    Pulmonary embolus ([place]) 2003   Pulmonary hypertension ([place])    followed by Dr Gaynell Face at [facility], now Dr. Maryjean Ka visit end 2'17.   Rectal incontinence    Renal cell carcinoma (HCC)    Scleroderma (HCC)    Tubular adenoma of colon    Uterine polyp     Past Surgical History:  Procedure Laterality Date   APPENDECTOMY  1962   BREAST LUMPECTOMY  1989   left   CATARACT EXTRACTION, BILATERAL Bilateral 12/2013   ENTEROSCOPY N/A 01/18/2016   Procedure: ENTEROSCOPY;  Surgeon: Mauri Pole, MD;  Location: [facility];  Service: Endoscopy;  Laterality: N/A;   ENTEROSCOPY N/A 05/24/2018   Procedure: ENTEROSCOPY;  Surgeon: Jackquline Denmark, MD;  Location: [facility];  Service: Endoscopy;  Laterality: N/A;   ESOPHAGOGASTRODUODENOSCOPY (EGD) WITH PROPOFOL N/A 12/21/2015   Procedure: ESOPHAGOGASTRODUODENOSCOPY (EGD) WITH PROPOFOL;  Surgeon: Irene Shipper, MD;  Location: [facility];  Service: Endoscopy;  Laterality: N/A;   HOT HEMOSTASIS N/A 05/24/2018   Procedure: HOT HEMOSTASIS (ARGON PLASMA COAGULATION/BICAP);  Surgeon: Jackquline Denmark, MD;  Location: [facility];  Service: Endoscopy;  Laterality: N/A;   IR GENERIC HISTORICAL  06/05/2016   IR RADIOLOGIST EVAL & MGMT 06/05/2016 Aletta Edouard, MD [facility] INTERV RAD   IVC Filter     KIDNEY SURGERY     left -"laser surgery by Dr. Kathlene Cote-  5 yrs ago" no removal   SCHLEROTHERAPY  05/24/2018   Procedure: SCHLEROTHERAPY;  Surgeon: Jackquline Denmark, MD;  Location: [facility];  Service: Endoscopy;;   TONSILLECTOMY     TUBAL LIGATION      Social History:  reports that she has never smoked. She has never used smokeless tobacco. She reports that she does not drink alcohol and does not use drugs.   Allergies  Allergen Reactions   Codeine Nausea Only    Hallucinations, too   Other Nausea And Vomiting    "-mycin" antibiotics.   Also cause hallucinations.   Erythromycin Nausea And Vomiting   Lisinopril     Pt  doesn't remember    Mycophenolate Mofetil Nausea Only   Sulfa Antibiotics Other (See Comments)    unknown    Family History  Problem Relation Age of Onset   Bladder Cancer Father    Diabetes Father    Prostate cancer Father    Alzheimer's disease Mother    Diabetes Sister    Lung cancer Sister         smoker   Esophageal cancer Paternal Uncle    Colon cancer Neg Hx     Family history reviewed and not pertinent    Prior to Admission medications   Medication Sig Start Date End Date Taking? Authorizing Provider  acetaminophen (TYLENOL) 325 MG tablet Take 650 mg by mouth every 6 (six) hours as needed for mild pain.    Yes [provider]  albuterol (VENTOLIN HFA) 108 (90 Base) MCG/ACT inhaler INHALE 2 PUFFS INTO THE LUNGS EVERY 6 HOURS AS NEEDED FOR WHEEZING OR SHORTNESS OF BREATH Patient taking differently: Inhale 2 puffs into the lungs every 6 (six) hours as needed for wheezing or shortness of breath. 03/06/20  Yes Hoyt Koch, MD  ALPRAZolam Duanne Moron) 0.25 MG tablet Take 0.5 tablets (0.125 mg total) by mouth at bedtime as needed. Patient taking differently: Take 0.125 mg by mouth at bedtime as needed for anxiety. 10/29/20  Yes Hoyt Koch, MD  atovaquone [facility]) 750 MG/5ML suspension Take 750 mg by mouth at bedtime. 07/10/20  Yes [provider]  diltiazem (CARDIZEM CD) 120 MG 24 hr capsule Take 120 mg by mouth daily.   Yes [provider]  fluticasone (FLONASE) 50 MCG/ACT nasal spray INSTILL 1 SPRAY INTO EACH NOSTRIL TWICE A DAY IF NEEDED Patient taking differently: Place 1 spray into both nostrils daily. 09/18/20  Yes Hoyt Koch, MD  furosemide (LASIX) 20 MG tablet Take 1 tablet (20 mg total) by mouth daily. Patient taking differently: Take 10 mg by mouth daily. 03/05/21  Yes Burns, Claudina Lick, MD  montelukast (SINGULAIR) 10 MG tablet TAKE 1 TABLET(10 MG) BY MOUTH AT BEDTIME Patient taking differently: Take 10 mg by mouth at  bedtime. 01/07/21  Yes Hoyt Koch, MD  Mouthwashes (DRY MOUTH MOUTHWASH) LIQD Use as directed 5 mLs in the mouth or throat as needed (dry mouth). Patient taking differently: Use as directed 5 mLs in the mouth or throat daily as needed (dry mouth). Biotene 02/15/18  Yes Hoyt Koch, MD  Multiple Vitamin (MULTIVITAMIN WITH MINERALS) TABS tablet Take 1 tablet by mouth daily. NO IRON   Yes [provider]  OPSUMIT 10 MG TABS Take 10 mg by mouth daily. 08/16/15  Yes [provider]  pantoprazole (PROTONIX) 40 MG tablet TAKE 1 TABLET(40 MG) BY MOUTH TWICE DAILY Patient taking differently: Take 40 mg by mouth daily as needed (heartburn). 12/24/20  Yes Nandigam, Venia Minks, MD  predniSONE (DELTASONE) 1 MG tablet Take 1 mg by mouth in the morning, at noon, in the evening, and at bedtime.   Yes [provider]  spironolactone (ALDACTONE) 25 MG tablet Take 12.5 mg by mouth daily. 06/09/20  Yes [provider]  sucralfate (CARAFATE) 1 GM/10ML suspension TAKE 10 MLS BY MOUTH AT BEDTIME Patient taking differently: Take 1 g by mouth daily. 09/17/20  Yes Mauri Pole, MD  ACCU-CHEK AVIVA PLUS test strip  06/04/20   [provider]  bacitracin ointment Apply 1 application topically 2 (two) times daily. Patient not taking: No sig reported 10/10/18   Charlesetta Shanks, MD  lidocaine (XYLOCAINE) 2 % solution Use as directed 5 mLs in the mouth or throat every 3 (three) hours as needed for mouth pain. Patient not taking: No sig reported 10/19/18   Hoyt Koch, MD  meclizine (ANTIVERT) 12.5 MG tablet Take 1-2 tablets (12.5-25 mg total) by mouth 3 (three) times daily as needed for dizziness. Patient not taking: Reported on 03/28/2021 10/26/17   Binnie Rail, MD  mupirocin cream (BACTROBAN) 2 % Apply 1 application topically 2 (two) times daily. Patient not taking: Reported on 03/28/2021 03/22/18   Binnie Rail, MD  omeprazole (PRILOSEC) 40 MG capsule  TAKE 1 CAPSULE(40 MG) BY MOUTH DAILY Patient not taking: No sig reported 08/13/20   Mauri Pole, MD  pilocarpine (SALAGEN) 5 MG tablet TAKE 1 TABLET(5 MG) BY MOUTH THREE TIMES DAILY AS NEEDED Patient not taking: No sig reported 12/19/19   Hoyt Koch, MD     Objective    Physical Exam: Vitals:   03/28/21 1308 03/28/21 1437 03/28/21 1500 03/28/21 1747  BP:  (!) 141/54 (!) 138/50   Pulse:  72 75   Resp:  15 18   Temp:      TempSrc:      SpO2:  94% (!) 89% 98%  Weight: 50.6 kg     Height: _0  (1.6 m)       General: appears to be stated age; alert, oriented Skin: warm, dry, no rash Head:  AT/[place] Mouth:  Oral mucosa membranes appear moist, normal dentition Neck: supple; trachea midline Heart:  RRR; did not appreciate any M/R/G Lungs: Slightly diminished bibasilar breath sounds, but otherwise CTAB, did not appreciate any wheezes, rales, or rhonchi Abdomen: + BS; soft, ND, NT Vascular: 2+ pedal pulses b/l; 2+ radial pulses b/l Extremities: trace to 1+ edema in b/l LE's; no muscle wasting Neuro: strength and sensation intact in upper and lower extremities b/l    Labs on Admission: I have personally reviewed following labs and imaging studies  CBC: Recent Labs  Lab 03/28/21 1356  WBC 7.6  NEUTROABS 6.1  HGB 7.1*  HCT 22.6*  MCV 109.2*  PLT 976   Basic Metabolic Panel: Recent Labs  Lab 03/28/21 1356  NA 134*  K 4.2  CL 100  CO2 25  GLUCOSE 103*  BUN 28*  CREATININE 1.32*  CALCIUM 8.8*   GFR: Estimated Creatinine Clearance: 26.2 mL/min (A) (by C-G formula based on SCr of 1.32 mg/dL (H)). Liver Function Tests: Recent Labs  Lab 03/28/21 1356  AST 24  ALT 13  ALKPHOS 36*  BILITOT 0.5  PROT 6.5  ALBUMIN 4.0   No results for input(s): LIPASE, AMYLASE in the last 168 hours. No results for input(s): AMMONIA in the last 168 hours. Coagulation Profile: No results for input(s): INR, PROTIME in the last 168  hours. Cardiac Enzymes: No results  for input(s): CKTOTAL, CKMB, CKMBINDEX, TROPONINI in the last 168 hours. BNP (last 3 results) Recent Labs    03/05/21 1136 03/08/21 0910  PROBNP 1,277.0* 1,554.0*   HbA1C: No results for input(s): HGBA1C in the last 72 hours. CBG: No results for input(s): GLUCAP in the last 168 hours. Lipid Profile: No results for input(s): CHOL, HDL, LDLCALC, TRIG, CHOLHDL, LDLDIRECT in the last 72 hours. Thyroid Function Tests: No results for input(s): TSH, T4TOTAL, FREET4, T3FREE, THYROIDAB in the last 72 hours. Anemia Panel: No results for input(s): VITAMINB12, FOLATE, FERRITIN, TIBC, IRON, RETICCTPCT in the last 72 hours. Urine analysis:    Component Value Date/Time   COLORURINE YELLOW 01/10/2021 0958   APPEARANCEUR CLEAR 01/10/2021 0958   LABSPEC 1.010 01/10/2021 0958   PHURINE 7.0 01/10/2021 0958   GLUCOSEU NEGATIVE 01/10/2021 0958   HGBUR NEGATIVE 01/10/2021 0958   BILIRUBINUR NEGATIVE 01/10/2021 0958   BILIRUBINUR Neg 09/03/2017 1529   KETONESUR NEGATIVE 01/10/2021 0958   PROTEINUR 30 09/03/2017 1529   PROTEINUR NEGATIVE 12/24/2015 2233   UROBILINOGEN 0.2 01/10/2021 0958   NITRITE NEGATIVE 01/10/2021 0958   LEUKOCYTESUR NEGATIVE 01/10/2021 0958    Radiological Exams on Admission: DG Chest 2 View  Result Date: 03/28/2021 CLINICAL DATA:  Weakness EXAM: CHEST - 2 VIEW COMPARISON:  March 22, 2021 FINDINGS: There is cardiomegaly with pulmonary venous hypertension. There is a degree of interstitial thickening without frank edema or consolidation. There is no appreciable adenopathy. There is aortic atherosclerosis. There are surgical clips in the left breast and left axillary regions. 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 IMPRESSION: Rather marked cardiomegaly with pulmonary vascular congestion. A degree of underlying pericardial effusion on the left cannot be excluded. No frank edema or consolidation. Suspect underlying chronic bronchitis given interstitial thickening. Postoperative change  left breast. Aortic Atherosclerosis (ICD10-I70.0). Electronically Signed   By: Lowella Grip III M.D.   On: 03/28/2021 14:11     EKG: Independently reviewed, with result as described above.    Assessment/Plan   BEYZA BELLINO is a 85 y.o. female with medical history significant for upper gastrointestinal bleeding in the setting of angiodysplasia of the stomach/duodenum as well as gastrointestinal AVMs, chronic anemia associated baseline hemoglobin 8.5-9.5, chronic pericardial effusion, GERD, stage IIIb chronic kidney disease with baseline creatinine 1.2-1.4, crest syndrome, pulmonary hypertension, interstitial lung disease chronic diastolic, who is admitted to [facility] on 03/28/2021 by way of transfer from [place] emergency department for further evaluation management of acute upper gastrointestinal bleed with associated acute on chronic anemia  after presenting from home to the latter facility complaining of generalized weakness.   Principal Problem:   Acute upper GI bleed Active Problems:   Essential hypertension   Acute on chronic anemia   Generalized weakness   Acute on chronic diastolic CHF (congestive heart failure) (HCC)   Elevated troponin       #) Acute Upper GI Bleed: diagnosis on the basis of 2 to 3 days of new onset dark-colored stool, with rectal exam performed this evening associated with fecal occult blood positive finding in the context of presenting acute on chronic anemia.  Interestingly, presenting BUN, while elevated, is unchanged relative to most recent prior value when checked on 03/08/2021 , potentially suggesting a more subacute onset for suspected acute upper gastrointestinal bleed .  This is in the context of a documented history of recurrent gastrointestinal bleeds in the setting of gastrointestinal AVMs as well as into  dysplasia of the stomach/duodenum, for which the patient follows with low-power gastroenterology as an outpatient.  Not  on any blood thinners at home, including no aspirin.  Denies any NSAID use.  No known history of underlying liver disease, denies any history of alcohol abuse or recent alcohol consumption.  Differential includes gastrointestinal AVMs versus angiodysplasia given a documented history of such, in addition to the possibility of Cushing's ulcer in the context of chronic systemic corticosteroid use versus gastritis versus peptic ulcer disease.  In the absence of known liver disease, initiation of SBP prophylaxis does not appear to be warranted. Presentation and history are less suggestive of variceal bleed, and therefore there does not appear to be an indication for octreotide.    At this time, the patient appears hemodynamically stable, with normotensive blood pressures in the absence of any associated tachycardia. Presentation appears to be associated with acute blood loss anemia superimposed on chronic anemia, as further described below, with presenting hemoglobin noted to be 7.1 relative to most recent prior value of 9.3 on 03/05/2021.  Mildly symptomatic, with presenting generalized weakness as well as shortness of breath, with potential additional contribution to the latter stemming from suspected acute on chronic diastolic heart failure as a result of acute on chronic anemia.  Regardless, in the setting of symptomatic anemia in the context of suspected presenting acute upper gastrointestinal bleed, will transfuse slowly 1 unit PRBC, with close monitoring for ensuing development of volume overload given history of chronic diastolic heart failure, with plan for dose of IV Lasix following completion of transfusion of this unit.  Additionally, given suspected upper GI source, will initiate Protonix drip.  On-call of our gastroenterology has been consulted, as above, and plans to evaluate the patient in the morning, at which time endoscopic evaluation is anticipated.  N.p.o. per the recommendations.   Plan: NPO.  Refraining from pharmacologic DVT prophylaxis. Monitor on telemetry. Monitor continuous pulse-ox. Maintain at least 2 large bore IV's. Check INR. Q4H H&H's have been ordered through 9 AM on 03/29/2021.  Transfuse 1 unit PRBC over 3 hours, with Lasix 20 mg IV x1 to be administered following completion of transfusion of this unit.  Monitor strict I's and O's and daily weights.  Protonix drip, as above.  Formal gastroenterology consult, as above.  Add on iron studies to pretransfusion specimen.  Repeat CMP in the morning, including attention to interval BUN trend.      #) Acute blood loss anemia superimposed on chronic anemia: in the setting of suspected acute upper GI bleed, presenting Hgb noted to be 7.1 relative to baseline hemoglobin range of 8.5-9.5, with most recent prior hemoglobin data point noted to be 9.3 on 03/05/2021.  At this time, the patient appears hemodynamically stable, with a mildly symptomatic with shortness of breath, prompting plan for transfusion of 1 unit PRBC as further described above.     Plan: work-up and management for presenting suspected acute upper GI bleed, as above, including close monitoring of Q4H H&H's.  Slow transfusion of 1 unit PBC over 3 hours, with Lasix 10 mg IV x1 administered following completion of transfusion of this unit.  Monitor on telemetry.  Monitor continuous pulse oximetry.  NPO.  Refraining from pharmacologic DVT prophylaxis.  Check INR.Add on the following to initial lab specimen collected in the ED today: total iron, TIBC, ferritin, MMA, folic acid level, reticulocyte count. Gastroenterology consulted, as above.  GI consulted, as above.      #) Generalized weakness: The patient reports  2 to 3 days of generalized weakness in the absence of any acute focal neurologic deficits.  Suspect contribution from acute on chronic anemia in the context of presenting acute upper gastrointestinal bleed, as above.  No evidence of underlying infectious process at  this time, although will check urinalysis to further evaluate this possibility.  Of note, nasopharyngeal COVID-19/influenza PCR were checked earlier today and found to be negative, while chest x-ray shows no evidence to suggest underlying pneumonia.  While in the prioritized focus will be further evaluation management of acute on chronic anemia in the setting of acute upper gastrointestinal bleed, the patient, depending upon ensuing clinical course may ultimately benefit a physical therapy consult, but will refrain from ordering such at this time in order to prioritize the above evaluation/management.    Plan: Management of acute on chronic anemia in setting of acute upper GI bleed, including transfusion of 1 unit PRBC, as further detailed above..  Check TSH.  Check urinalysis.  Repeat CMP and CBC in the morning.       #) Acute on chronic diastolic heart failure: In the context of documented history of chronic diastolic heart failure, with most recent echocardiogram occurring in November 2019 notable for findings include grade 1 diastolic dysfunction, suspect an element of acute exacerbation in the setting of recent shortness of breath, elevated BNP, and chest x-ray showing interval development of pulmonary vascular congestion with small left pleural effusion.  Acutely decompensated heart failure is on the basis of acute on chronic anemia.  ACS is the primary factor leading to acute decompensation heart failure appears less likely, with mildly elevated high-sensitivity troponin I felt to represent type II supply demand mismatch in the setting of acute on chronic anemia, as further detailed below, while noting repeat troponin I value trending down.  Additionally, presentation not associate with any chest pain, and EKG shows no evidence of acute ischemic changes relative to most recent prior EKG, as further detailed above.  We will attend to primary underlying pathology, which is felt to be acute on chronic  anemia in the setting of acute upper gastrointestinal bleed, with plan for slow transfusion of 1 unit PRBC, as above, with order for Lasix 20 mg IV x1 to be administered following completion of transfusion of this unit.  May subsequently need additional Lasix, but will monitor clinical response to this 1 unit before empirically ordering additional diuresis given presenting acute upper gastrointestinal bleed.  Will likely ultimately benefit from repeat echocardiogram given mildly elevated troponin in the setting of suspected acute on chronic diastolic heart failure.  However, will refrain from ordering echocardiogram at this time in order to emphasize evaluation management of primary pathology, which is suspected to be acute on chronic gastrointestinal bleed leading to acute on chronic anemia, as above.  Additionally, in the setting of a documented history of chronic pericardial effusion, which may carry with it some degree of preload dependence, will refrain from aggressive diuresis efforts.  Plan: Monitor strict I's and O's and daily weights.  Add on serum magnesium level.  Repeat CMP in the morning.  Monitor on telemetry.  Monitor continuous pulse oximetry.  Lasix 20 mg IV x1 following completion of transfusion of 1 unit PBC, as above.  Consider pursuing echocardiogram following evaluation of acute upper gastrointestinal bleed, as above.       #) Elevated troponin: Mildly elevated initial troponin of 26, with subsequent trend down to 25.  Suspect that this is on the basis of type II supply  demand mismatch in the setting of diminished oxygen carrying/delivery capacity of presenting acute on chronic anemia as opposed to representing a type I process due to acute plaque rupture.  Overall, ACS is felt to be less likely relative to a type II supply demand mismatch, as further detailed above.  Presentation associated with any chest pain or acute ischemic changes on EKG.  Plan: Further evaluation management of  acute on chronic anemia in the setting of acute upper gastrointestinal bleed, as above, including transfusion of 1 unit PBC, as above.  Monitor on telemetry.  Monitor continuous pulse oximetry.       #) Acute hypoxic respiratory distress: In the setting of no known baseline supplemental oxygen requirements, presenting oxygen saturation noted to be 89% on room air, with ensuing improvement into the high 90s on 2 L nasal cannula.  Suspect potential multifactorial contributions, including acute on chronic diastolic heart failure as a consequence of acute on chronic anemia in the setting of suspected acute upper gastrointestinal bleed, as above.  ACS felt to be less likely, as above.  Clinically, presentation suspicious pulmonary embolism.  No clinical or radiographic evidence to suggest underlying pneumonia.  Plan: Check VBG.  As needed albuterol.  Check serum magnesium and phosphorus levels.  Monitor on telemetry.  Monitor continuous pulse oximetry.  Further evaluation management of acute on chronic diastolic heart failure as well as acute on chronic anemia, as above.      #) Stage IIIb chronic kidney disease: Associated with baseline creatinine 1.2 -1.4, with presenting serum creatinine found to be within this baseline range.  Plan: Monitor strict I's and O's and daily weights.  Tempt avoid nephrotoxic agents.  Repeat BMP in the morning.     DVT prophylaxis: SCDs Code Status: Full code Family Communication: none Disposition Plan: Per Rounding Team Consults called: On-call at our gastroenterology consulted, as further described above. Admission status: Observation; PCU.     Of note, this patient was added by me to the following Admit List/Treatment Team: [email or web address].      PLEASE NOTE THAT DRAGON DICTATION SOFTWARE WAS USED IN THE CONSTRUCTION OF THIS NOTE.   [place] [facility] Pager [phone] From [place]  Otherwise, please contact night-coverage   [email or web address] Password [facility]   03/28/2021, 7:42 PM

## 2021-03-28 NOTE — ED Notes (Signed)
Paula Ward  Phone number [phone]  Call when Pt. Has a bed

## 2021-03-28 NOTE — ED Notes (Signed)
Pt having mild nose bleed from right nares.  Tissue provided.  Pt thinks it is due to Covid test done earlier today.

## 2021-03-29 ENCOUNTER — Observation Stay (HOSPITAL_COMMUNITY): Payer: Medicare PPO | Admitting: Anesthesiology

## 2021-03-29 ENCOUNTER — Encounter (HOSPITAL_COMMUNITY)
Admission: EM | Disposition: A | Discharge status: Home / Self Care | Payer: Self-pay | Source: Home / Self Care | Attending: Internal Medicine

## 2021-03-29 ENCOUNTER — Encounter (HOSPITAL_COMMUNITY): Payer: Self-pay | Admitting: Internal Medicine

## 2021-03-29 DIAGNOSIS — K5520 Angiodysplasia of colon without hemorrhage: Secondary | ICD-10-CM

## 2021-03-29 DIAGNOSIS — R778 Other specified abnormalities of plasma proteins: Secondary | ICD-10-CM | POA: Diagnosis present

## 2021-03-29 DIAGNOSIS — J309 Allergic rhinitis, unspecified: Secondary | ICD-10-CM | POA: Diagnosis present

## 2021-03-29 DIAGNOSIS — R627 Adult failure to thrive: Secondary | ICD-10-CM | POA: Diagnosis present

## 2021-03-29 DIAGNOSIS — D649 Anemia, unspecified: Secondary | ICD-10-CM | POA: Diagnosis present

## 2021-03-29 DIAGNOSIS — R7989 Other specified abnormal findings of blood chemistry: Secondary | ICD-10-CM | POA: Diagnosis present

## 2021-03-29 DIAGNOSIS — K922 Gastrointestinal hemorrhage, unspecified: Secondary | ICD-10-CM | POA: Diagnosis present

## 2021-03-29 DIAGNOSIS — Z7952 Long term (current) use of systemic steroids: Secondary | ICD-10-CM | POA: Diagnosis not present

## 2021-03-29 DIAGNOSIS — J849 Interstitial pulmonary disease, unspecified: Secondary | ICD-10-CM | POA: Diagnosis present

## 2021-03-29 DIAGNOSIS — D62 Acute posthemorrhagic anemia: Secondary | ICD-10-CM | POA: Diagnosis present

## 2021-03-29 DIAGNOSIS — R918 Other nonspecific abnormal finding of lung field: Secondary | ICD-10-CM | POA: Diagnosis present

## 2021-03-29 DIAGNOSIS — R531 Weakness: Secondary | ICD-10-CM

## 2021-03-29 DIAGNOSIS — Z881 Allergy status to other antibiotic agents status: Secondary | ICD-10-CM | POA: Diagnosis not present

## 2021-03-29 DIAGNOSIS — I130 Hypertensive heart and chronic kidney disease with heart failure and stage 1 through stage 4 chronic kidney disease, or unspecified chronic kidney disease: Secondary | ICD-10-CM | POA: Diagnosis present

## 2021-03-29 DIAGNOSIS — N1831 Chronic kidney disease, stage 3a: Secondary | ICD-10-CM | POA: Diagnosis present

## 2021-03-29 DIAGNOSIS — I272 Pulmonary hypertension, unspecified: Secondary | ICD-10-CM | POA: Diagnosis present

## 2021-03-29 DIAGNOSIS — Z853 Personal history of malignant neoplasm of breast: Secondary | ICD-10-CM | POA: Diagnosis not present

## 2021-03-29 DIAGNOSIS — Z86711 Personal history of pulmonary embolism: Secondary | ICD-10-CM | POA: Diagnosis not present

## 2021-03-29 DIAGNOSIS — Z79899 Other long term (current) drug therapy: Secondary | ICD-10-CM | POA: Diagnosis not present

## 2021-03-29 DIAGNOSIS — Z885 Allergy status to narcotic agent status: Secondary | ICD-10-CM | POA: Diagnosis not present

## 2021-03-29 DIAGNOSIS — I5032 Chronic diastolic (congestive) heart failure: Secondary | ICD-10-CM | POA: Diagnosis present

## 2021-03-29 DIAGNOSIS — K219 Gastro-esophageal reflux disease without esophagitis: Secondary | ICD-10-CM | POA: Diagnosis present

## 2021-03-29 DIAGNOSIS — I313 Pericardial effusion (noninflammatory): Secondary | ICD-10-CM | POA: Diagnosis present

## 2021-03-29 DIAGNOSIS — M349 Systemic sclerosis, unspecified: Secondary | ICD-10-CM | POA: Diagnosis present

## 2021-03-29 DIAGNOSIS — D631 Anemia in chronic kidney disease: Secondary | ICD-10-CM | POA: Diagnosis present

## 2021-03-29 DIAGNOSIS — K5521 Angiodysplasia of colon with hemorrhage: Secondary | ICD-10-CM | POA: Diagnosis present

## 2021-03-29 DIAGNOSIS — Z85528 Personal history of other malignant neoplasm of kidney: Secondary | ICD-10-CM | POA: Diagnosis not present

## 2021-03-29 DIAGNOSIS — I5033 Acute on chronic diastolic (congestive) heart failure: Secondary | ICD-10-CM | POA: Diagnosis present

## 2021-03-29 DIAGNOSIS — K921 Melena: Secondary | ICD-10-CM | POA: Diagnosis not present

## 2021-03-29 DIAGNOSIS — J9601 Acute respiratory failure with hypoxia: Secondary | ICD-10-CM | POA: Diagnosis present

## 2021-03-29 DIAGNOSIS — Z20822 Contact with and (suspected) exposure to covid-19: Secondary | ICD-10-CM | POA: Diagnosis present

## 2021-03-29 HISTORY — PX: HOT HEMOSTASIS: SHX5433

## 2021-03-29 HISTORY — PX: ENTEROSCOPY: SHX5533

## 2021-03-29 LAB — COMPREHENSIVE METABOLIC PANEL
ALT: 13 U/L (ref 0–44)
AST: 23 U/L (ref 15–41)
Albumin: 3.5 g/dL (ref 3.5–5.0)
Alkaline Phosphatase: 31 U/L — ABNORMAL LOW (ref 38–126)
Anion gap: 10 (ref 5–15)
BUN: 25 mg/dL — ABNORMAL HIGH (ref 8–23)
CO2: 25 mmol/L (ref 22–32)
Calcium: 8.6 mg/dL — ABNORMAL LOW (ref 8.9–10.3)
Chloride: 102 mmol/L (ref 98–111)
Creatinine, Ser: 1.1 mg/dL — ABNORMAL HIGH (ref 0.44–1.00)
GFR, Estimated: 50 mL/min — ABNORMAL LOW (ref >60)
Glucose, Bld: 77 mg/dL (ref 70–99)
Potassium: 4.2 mmol/L (ref 3.5–5.1)
Sodium: 137 mmol/L (ref 135–145)
Total Bilirubin: 0.8 mg/dL (ref 0.3–1.2)
Total Protein: 5.7 g/dL — ABNORMAL LOW (ref 6.5–8.1)

## 2021-03-29 LAB — FERRITIN: Ferritin: <1300 ng/mL — ABNORMAL HIGH (ref 11–307)

## 2021-03-29 LAB — BLOOD GAS, VENOUS
Acid-Base Excess: 1.2 mmol/L (ref 0.0–2.0)
Bicarbonate: 26.1 mmol/L (ref 20.0–28.0)
FIO2: 21
O2 Saturation: 97.1 %
Patient temperature: 98.6
pCO2, Ven: 46.5 mmHg (ref 44.0–60.0)
pH, Ven: 7.368 (ref 7.250–7.430)
pO2, Ven: 85.8 mmHg — ABNORMAL HIGH (ref 32.0–45.0)

## 2021-03-29 LAB — CBC
HCT: 24.7 % — ABNORMAL LOW (ref 36.0–46.0)
Hemoglobin: 7.6 g/dL — ABNORMAL LOW (ref 12.0–15.0)
MCH: 32.3 pg (ref 26.0–34.0)
MCHC: 30.8 g/dL (ref 30.0–36.0)
MCV: 105.1 fL — ABNORMAL HIGH (ref 80.0–100.0)
Platelets: 146 10*3/uL — ABNORMAL LOW (ref 150–400)
RBC: 2.35 MIL/uL — ABNORMAL LOW (ref 3.87–5.11)
RDW: 21.5 % — ABNORMAL HIGH (ref 11.5–15.5)
WBC: 5.3 10*3/uL (ref 4.0–10.5)
nRBC: 0 % (ref 0.0–0.2)

## 2021-03-29 LAB — MAGNESIUM
Magnesium: 2.1 mg/dL (ref 1.7–2.4)
Magnesium: 2 mg/dL (ref 1.7–2.4)

## 2021-03-29 LAB — IRON AND TIBC
Iron: 32 ug/dL (ref 28–170)
Saturation Ratios: 11 % (ref 10.4–31.8)
TIBC: 282 ug/dL (ref 250–450)
UIBC: 250 ug/dL

## 2021-03-29 LAB — PHOSPHORUS: Phosphorus: 4.5 mg/dL (ref 2.5–4.6)

## 2021-03-29 LAB — PREPARE RBC (CROSSMATCH)

## 2021-03-29 LAB — PROTIME-INR
INR: 1.1 (ref 0.8–1.2)
Prothrombin Time: 14.6 seconds (ref 11.4–15.2)

## 2021-03-29 SURGERY — ENTEROSCOPY
Anesthesia: Monitor Anesthesia Care

## 2021-03-29 MED ORDER — SODIUM CHLORIDE 0.9 % IV SOLN: INTRAVENOUS | Status: DC

## 2021-03-29 MED ORDER — PROPOFOL 500 MG/50ML IV EMUL
INTRAVENOUS | Status: DC | PRN
  Administered 2021-03-29: 150 ug/kg/min via INTRAVENOUS

## 2021-03-29 MED ORDER — PROPOFOL 500 MG/50ML IV EMUL
INTRAVENOUS | Status: AC
  Filled 2021-03-29: qty 50

## 2021-03-29 MED ORDER — ONDANSETRON HCL 4 MG/2ML IJ SOLN
4.0000 mg | Freq: Once | INTRAMUSCULAR | Status: AC
  Administered 2021-03-29: 4 mg via INTRAVENOUS

## 2021-03-29 MED ORDER — PANTOPRAZOLE SODIUM 40 MG PO TBEC
40.0000 mg | DELAYED_RELEASE_TABLET | Freq: Every day | ORAL | Status: DC
  Administered 2021-03-30 – 2021-04-01 (×3): 40 mg via ORAL
  Filled 2021-03-29 (×3): qty 1

## 2021-03-29 MED ORDER — ONDANSETRON HCL 4 MG/2ML IJ SOLN
INTRAMUSCULAR | Status: AC
  Filled 2021-03-29: qty 2

## 2021-03-29 MED ORDER — SODIUM CHLORIDE 0.9% IV SOLUTION: Freq: Once | INTRAVENOUS | Status: AC

## 2021-03-29 MED ORDER — LACTATED RINGERS IV SOLN: INTRAVENOUS | Status: DC | PRN

## 2021-03-29 NOTE — Telephone Encounter (Signed)
Left message on machine to call back  

## 2021-03-29 NOTE — Progress Notes (Signed)
PROGRESS NOTE    Paula Ward  WEX:[id number] DOB: 12-Jan-1936 DOA: 03/28/2021 PCP: Hoyt Koch, MD    Chief Complaint  Patient presents with   Abdominal Pain   Melena    Brief Narrative:  Paula Ward is a 85 y.o. female with medical history significant for upper gastrointestinal bleeding in the setting of angiodysplasia of the stomach/duodenum as well as gastrointestinal AVMs, chronic anemia associated baseline hemoglobin 8.5-9.5, chronic pericardial effusion,  stage IIIb chronic kidney disease with baseline creatinine 1.2-1.4, crest syndrome, pulmonary hypertension, interstitial lung disease , who is admitted to [facility] on 03/28/2021 by way of transfer from [facility] emergency department for further evaluation management of dark stool and generalized weakness.  Subjective:   Npo, to have egd this afternoon Denies pain, no active bleeding observed  Husband at bedside   Assessment & Plan:   Principal Problem:   Acute upper GI bleed Active Problems:   Essential hypertension   Acute on chronic anemia   Generalized weakness   Acute on chronic diastolic CHF (congestive heart failure) (HCC)   Elevated troponin   Intestinal angiodysplasia   GI bleed   Acute on chronic blood loss anemia -Present with dark color stool and weakness, FOBT positive -Received 1 units PRBC transfusion, with appropriate improvement of hemoglobin -She is to have EGD today  Interstitial lung disease/crest syndrome Report she has been on prednisone for the last few month, in the process of tapering prednisone Appear on atovaquone At home as well Continue PPI  Acute hypoxic respiratory failure, likely from anemia and possible mild diastolic CHF exacerbation oxygen saturation 89% on room air, which improved to 98% on 2 L nasal cannula.  She is not on oxygen at home She denies feeling short of breath, no cough, no chest pain, she does has elevated BNP level,  high-sensitivity troponin mild and flat on Lasix and spironolactone at home, she received IV Lasix on presentation Wean oxygen, will check ambulatory pulse ox Close monitor volume status  CKD 3 A -Creatinine appears close to baseline -Monitor, renal dosing medication    Body mass index is 19.64 kg/m.Mar[place] Kitchen     Unresulted Labs (From admission, onward)     Start     Ordered   03/30/21 0500  CBC  Tomorrow morning,   R       Question:  Specimen collection method  Answer:  Lab=Lab collect   03/29/21 1851   03/30/21 9381  Basic metabolic panel  Tomorrow morning,   R       Question:  Specimen collection method  Answer:  Lab=Lab collect   03/29/21 1851   03/29/21 1831  Prepare RBC (crossmatch)  (Adult Blood Administration - Red Blood Cells)  Once,   R       Question Answer Comment  # of Units 1 unit   Transfusion Indications Symptomatic Anemia   Number of Units to Keep Ahead NO units ahead   If emergent release call blood bank [facility] [phone]      03/29/21 1831   03/28/21 1942  Methylmalonic acid, serum  Add-on,   AD        03/28/21 1941              DVT prophylaxis: SCDs Start: 03/28/21 1937   Code Status: Full Family Communication: Husband at bedside Disposition:   Status is: Inpatient    Dispo: The patient is from: Home  Anticipated d/c is to: Home              Anticipated d/c date is: 24 to 48 hours pending hemoglobin improvement and need GI clearance                Consultants:  GI  Procedures:  EGD planned on 6/10  Antimicrobials:    Anti-infectives (From admission, onward)    None          Objective: Vitals:   03/29/21 1540 03/29/21 1806 03/29/21 1828 03/29/21 2037  BP: (!) 140/49 (!) 121/50 (!) 124/51 (!) 124/49  Pulse: 79 81 84 94  Resp: _0 Temp: 97.6 F (36.4 C) 98 F (36.7 C) 98.8 F (37.1 C) 98.3 F (36.8 C)  TempSrc: Oral Oral Oral Oral  SpO2: 94% 93% 98% 91%  Weight:      Height:         Intake/Output Summary (Last 24 hours) at 03/29/2021 2207 Last data filed at 03/29/2021 2035 Gross per 24 hour  Intake 1495.68 ml  Output --  Net 1495.68 ml   Filed Weights   03/28/21 1308 03/29/21 0500 03/29/21 1311  Weight: 50.6 kg 50.3 kg 50.3 kg    Examination:  General exam: calm, NAD Respiratory system: Fine crackles , no wheezing , no rhonchi , Respiratory effort normal. Cardiovascular system: S1 & S2 heard, RRR. + precordial  murmur, No pedal edema. Gastrointestinal system: Abdomen is nondistended, soft and nontender. Normal bowel sounds heard. Central nervous system: Alert and oriented. No focal neurological deficits. Extremities: Symmetric 5 x 5 power. Skin: No rashes, lesions or ulcers Psychiatry: Judgement and insight appear normal. Mood & affect appropriate.     Data Reviewed: I have personally reviewed following labs and imaging studies  CBC: Recent Labs  Lab 03/28/21 1356 03/28/21 2039 03/29/21 0534  WBC 7.6  --  5.3  NEUTROABS 6.1  --   --   HGB 7.1* 6.4* 7.6*  HCT 22.6* 21.1* 24.7*  MCV 109.2*  --  105.1*  PLT 324  --  146*    Basic Metabolic Panel: Recent Labs  Lab 03/28/21 1356 03/28/21 2039 03/29/21 0534  NA 134*  --  137  K 4.2  --  4.2  CL 100  --  102  CO2 25  --  25  GLUCOSE 103*  --  77  BUN 28*  --  25*  CREATININE 1.32*  --  1.10*  CALCIUM 8.8*  --  8.6*  MG  --  2.1 2.0  PHOS  --   --  4.5    GFR: Estimated Creatinine Clearance: 31.3 mL/min (A) (by C-G formula based on SCr of 1.1 mg/dL (H)).  Liver Function Tests: Recent Labs  Lab 03/28/21 1356 03/29/21 0534  AST 24 23  ALT 13 13  ALKPHOS 36* 31*  BILITOT 0.5 0.8  PROT 6.5 5.7*  ALBUMIN 4.0 3.5    CBG: No results for input(s): GLUCAP in the last 168 hours.   Recent Results (from the past 240 hour(s))  Resp Panel by RT-PCR (Flu A&B, Covid) Nasopharyngeal Swab     Status: None   Collection Time: 03/28/21  3:10 PM   Specimen: Nasopharyngeal Swab;  Nasopharyngeal(NP) swabs in vial transport medium  Result Value Ref Range Status   SARS Coronavirus 2 by RT PCR NEGATIVE NEGATIVE Final    Comment: (NOTE) SARS-CoV-2 target nucleic acids are NOT DETECTED.  The SARS-CoV-2 RNA is generally detectable in upper respiratory specimens  during the acute phase of infection. The lowest concentration of SARS-CoV-2 viral copies this assay can detect is 138 copies/mL. A negative result does not preclude SARS-Cov-2 infection and should not be used as the sole basis for treatment or other patient management decisions. A negative result may occur with  improper specimen collection/handling, submission of specimen other than nasopharyngeal swab, presence of viral mutation(s) within the areas targeted by this assay, and inadequate number of viral copies(<138 copies/mL). A negative result must be combined with clinical observations, patient history, and epidemiological information. The expected result is Negative.  Fact Sheet for Patients:  EntrepreneurPulse.com.au  Fact Sheet for Healthcare Providers:  IncredibleEmployment.be  This test is no t yet approved or cleared by the [place] FDA and  has been authorized for detection and/or diagnosis of SARS-CoV-2 by FDA under an Emergency Use Authorization (EUA). This EUA will remain  in effect (meaning this test can be used) for the duration of the COVID-19 declaration under Section 564(b)(1) of the Act, 21 U.S.C.section 360bbb-3(b)(1), unless the authorization is terminated  or revoked sooner.       Influenza A by PCR NEGATIVE NEGATIVE Final   Influenza B by PCR NEGATIVE NEGATIVE Final    Comment: (NOTE) The Xpert Xpress SARS-CoV-2/FLU/RSV plus assay is intended as an aid in the diagnosis of influenza from Nasopharyngeal swab specimens and should not be used as a sole basis for treatment. Nasal washings and aspirates are unacceptable for Xpert Xpress  SARS-CoV-2/FLU/RSV testing.  Fact Sheet for Patients: EntrepreneurPulse.com.au  Fact Sheet for Healthcare Providers: IncredibleEmployment.be  This test is not yet approved or cleared by the [place] FDA and has been authorized for detection and/or diagnosis of SARS-CoV-2 by FDA under an Emergency Use Authorization (EUA). This EUA will remain in effect (meaning this test can be used) for the duration of the COVID-19 declaration under Section 564(b)(1) of the Act, 21 U.S.C. section 360bbb-3(b)(1), unless the authorization is terminated or revoked.  Performed at [facility], [address], [place], [place] [address]          Radiology Studies: DG Chest 2 View  Result Date: 03/28/2021 CLINICAL DATA:  Weakness EXAM: CHEST - 2 VIEW COMPARISON:  March 22, 2021 FINDINGS: There is cardiomegaly with pulmonary venous hypertension. There is a degree of interstitial thickening without frank edema or consolidation. There is no appreciable adenopathy. There is aortic atherosclerosis. There are surgical clips in the left breast and left axillary regions. 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 IMPRESSION: Rather marked cardiomegaly with pulmonary vascular congestion. A degree of underlying pericardial effusion on the left cannot be excluded. No frank edema or consolidation. Suspect underlying chronic bronchitis given interstitial thickening. Postoperative change left breast. Aortic Atherosclerosis (ICD10-I70.0). Electronically Signed   By: Lowella Grip III M.D.   On: 03/28/2021 14:11        Scheduled Meds:  sodium chloride   Intravenous Once   sodium chloride   Intravenous Once   fluticasone  1 spray Each Nare Daily   macitentan  10 mg Oral Daily   montelukast  10 mg Oral QHS   [START ON 03/30/2021] pantoprazole  40 mg Oral Daily   predniSONE  1 mg Oral 4 times per day   Continuous Infusions:     LOS: 0 days   Time spent: 35  mins Greater than 50% of this time was spent in counseling, explanation of diagnosis, planning of further management, and coordination of care.   Voice Recognition /  Dragon dictation system was used to create this note, attempts have been made to correct errors. Please contact the author with questions and/or clarifications.   Florencia Reasons, MD PhD FACP [facility]  Available via Epic secure chat 7am-7pm for nonurgent issues Please page for urgent issues To page the attending provider between 7A-7P or the covering provider during after hours 7P-7A, please log into the web site [email or web address] and access using universal [place] password for that web site. If you do not have the password, please call the hospital operator.    03/29/2021, 10:07 PM

## 2021-03-29 NOTE — Anesthesia Preprocedure Evaluation (Addendum)
Anesthesia Evaluation  Patient identified by MRN, date of birth, ID band Patient awake    Reviewed: Allergy & Precautions, NPO status , Patient's Chart, lab work & pertinent test results  History of Anesthesia Complications (+) PONV and history of anesthetic complications  Airway Mallampati: II  TM Distance: >3 FB Neck ROM: Full    Dental  (+) Dental Advisory Given   Pulmonary  Pulmonary hypertension,; interstitial lung disease   Pulmonary exam normal        Cardiovascular hypertension, Pt. on medications  Rhythm:Regular Rate:Normal + Systolic murmurs Study Conclusions   - Left ventricle: The cavity size was normal. Wall thickness was  increased in a pattern of moderate LVH. Systolic function was  vigorous. The estimated ejection fraction was in the range of 65%  to 70%. Wall motion was normal; there were no regional wall  motion abnormalities. Doppler parameters are consistent with  abnormal left ventricular relaxation (grade 1 diastolic  dysfunction). GLS: -23.5%  - Mitral valve: Moderately calcified annulus.  - Left atrium: The atrium was severely dilated. Volume/bsa, ES  (1-plane Simpson&'s, A4C): 76.4 ml/m^2.  - Pericardium, extracardiac: A large, free-flowing pericardial  effusion was identified circumferential to the heart. There was  no evidence of hemodynamic compromise.   Impressions:   - Compared to the prior study, there has been no significant  interval change.   Echo 11/2015: NORMAL LEFT VENTRICULAR SYSTOLIC FUNCTION WITH MILD LVH NORMAL RIGHT VENTRICULAR SYSTOLIC FUNCTIONVALVULAR REGURGITATION: TRIVIAL MR, TRIVIAL PR, MILD TRNO VALVULAR STENOSIS LARGE PERICARDIAL EFFUSION (See above) ESTIMATED RVSP=43mHG LARGE PERICARDIAL EFFUSION WITH NO RESPIRATORY FLOW VARIATION AND NORMALIVC    Neuro/Psych negative neurological ROS     GI/Hepatic Neg liver ROS, GERD  Medicated,Gastric  AVMs   Endo/Other  negative endocrine ROS  Renal/GU Renal InsufficiencyRenal disease     Musculoskeletal  (+) Arthritis , Osteoarthritis,    Abdominal   Peds  Hematology  (+) Blood dyscrasia, anemia ,   Anesthesia Other Findings Day of surgery medications reviewed with the patient.  CREST syndrome   Reproductive/Obstetrics                           Anesthesia Physical  Anesthesia Plan  ASA: 3  Anesthesia Plan: MAC   Post-op Pain Management:    Induction:   PONV Risk Score and Plan: 3 and Ondansetron and Propofol infusion  Airway Management Planned: Nasal Cannula, Natural Airway and Simple Face Mask  Additional Equipment:   Intra-op Plan:   Post-operative Plan:   Informed Consent: I have reviewed the patients History and Physical, chart, labs and discussed the procedure including the risks, benefits and alternatives for the proposed anesthesia with the patient or authorized representative who has indicated his/her understanding and acceptance.     Dental advisory given  Plan Discussed with: Anesthesiologist and CRNA  Anesthesia Plan Comments:        Anesthesia Quick Evaluation

## 2021-03-29 NOTE — Anesthesia Postprocedure Evaluation (Signed)
Anesthesia Post Note  Patient: Paula Ward  Procedure(s) Performed: ENTEROSCOPY HOT HEMOSTASIS (ARGON PLASMA COAGULATION/BICAP)     Patient location during evaluation: PACU Anesthesia Type: MAC Level of consciousness: awake and alert Pain management: pain level controlled Vital Signs Assessment: post-procedure vital signs reviewed and stable Respiratory status: spontaneous breathing and respiratory function stable Cardiovascular status: stable Postop Assessment: no apparent nausea or vomiting Anesthetic complications: no   No notable events documented.  Last Vitals:  Vitals:   03/29/21 1501 03/29/21 1510  BP: (!) 131/36 (!) 140/48  Pulse: 87 93  Resp: 18 (!) 23  Temp:  36.5 C  SpO2: 100% 91%    Last Pain:  Vitals:   03/29/21 1510  TempSrc: Oral  PainSc: 0-No pain                 Malaia Buchta DANIEL

## 2021-03-29 NOTE — Procedures (Addendum)
Discussed results of small bowel enteroscopy with the patient's husband by phone. Time provided for questions and answers  Hemoglobin 7.6 after 1 unit of packed red cells Will give 1 additional unit now and monitor response  I will check on her tomorrow

## 2021-03-29 NOTE — Transfer of Care (Signed)
Immediate Anesthesia Transfer of Care Note  Patient: Paula Ward  Procedure(s) Performed: Procedure(s): ENTEROSCOPY (N/A) HOT HEMOSTASIS (ARGON PLASMA COAGULATION/BICAP) (N/A)  Patient Location: PACU and Endoscopy Unit  Anesthesia Type:MAC  Level of Consciousness: awake, alert  and oriented  Airway & Oxygen Therapy: Patient Spontanous Breathing and Patient connected to nasal cannula oxygen  Post-op Assessment: Report given to RN and Post -op Vital signs reviewed and stable  Post vital signs: Reviewed and stable  Last Vitals:  Vitals:   03/29/21 0800 03/29/21 1311  BP: (!) 135/55 (!) 148/59  Pulse: 79 86  Resp:  (!) 24  Temp: 36.8 C 36.7 C  SpO2: 32% 41%    Complications: No apparent anesthesia complications

## 2021-03-29 NOTE — Op Note (Signed)
[facility] Patient Name: Paula Ward Procedure Date: 03/29/2021 MRN: [id number] Attending MD: Jerene Bears , MD Date of Birth: [birth year] CSN: [id number] Age: 85 Admit Type: Inpatient Procedure:                Small bowel enteroscopy Indications:              Acute post hemorrhagic anemia, Melena,                            Arteriovenous malformation in the stomach,                            Arteriovenous malformation in the small intestine Providers:                Lajuan Lines. Hilarie Fredrickson, MD, Janee Morn, Technician Referring MD:             [facility] Medicines:                Monitored Anesthesia Care Complications:            No immediate complications. Estimated Blood Loss:     Estimated blood loss was minimal. Procedure:                Pre-Anesthesia Assessment:                           - Prior to the procedure, a History and Physical                            was performed, and patient medications and                            allergies were reviewed. The patient's tolerance of                            previous anesthesia was also reviewed. The risks                            and benefits of the procedure and the sedation                            options and risks were discussed with the patient.                            All questions were answered, and informed consent                            was obtained. Prior Anticoagulants: The patient has                            taken no previous anticoagulant or antiplatelet                            agents. ASA Grade Assessment: III - A patient with  severe systemic disease. After reviewing the risks                            and benefits, the patient was deemed in                            satisfactory condition to undergo the procedure.                           After obtaining informed consent, the endoscope was                            passed under direct  vision. Throughout the                            procedure, the patient's blood pressure, pulse, and                            oxygen saturations were monitored continuously. The                            [id number]-Q180 ([id number]) Olympus enteroscope was                            introduced through the mouth and advanced to the                            proximal jejunum. The small bowel enteroscopy was                            accomplished without difficulty. The patient                            tolerated the procedure well. Scope In: Scope Out: Findings:      The examined esophagus was normal.      The entire examined stomach was normal.      Four angioectasias with one with stigmata of recent bleeding (adjacent       blood) were found in the third portion of the duodenum and in the fourth       portion of the duodenum. Fulguration to stop the bleeding and to prevent       bleeding by argon plasma at 0.5 liters/minute and 20 watts was       successful.      Two angioectasias with no bleeding were found in the proximal jejunum.       Fulguration to ablate the lesion to prevent bleeding by argon plasma at       0.5 liters/minute and 20 watts was successful. Impression:               - Normal esophagus.                           - Normal stomach.                           - Four angioectasias, 1 recently  bleeding, in the                            duodenum. Treated with argon plasma coagulation                            (APC).                           - Two non-bleeding angioectasias in the jejunum.                            Treated with argon plasma coagulation (APC).                           - No specimens collected. Moderate Sedation:      N/A Recommendation:           - Return patient to hospital ward for ongoing care.                           - Advance diet as tolerated.                           - Monitor Hgb closely. Repeat iron studies in 2                             weeks and replace iron if needed.                           - If Hgb stable okay for discharge home.                           - Should follow-up with PCP after discharge and                            [facility] if needed. Procedure Code(s):        --- Professional ---                           [phone], Small intestinal endoscopy, enteroscopy                            beyond second portion of duodenum, not including                            ileum; with control of bleeding (eg, injection,                            bipolar cautery, unipolar cautery, laser, heater                            probe, stapler, plasma coagulator) Diagnosis Code(s):        --- Professional ---  K31.811, Angiodysplasia of stomach and duodenum                            with bleeding                           K55.20, Angiodysplasia of colon without hemorrhage                           D62, Acute posthemorrhagic anemia                           K92.1, Melena (includes Hematochezia) CPT copyright 2019 American Medical Association. All rights reserved. The codes documented in this report are preliminary and upon coder review may  be revised to meet current compliance requirements. Jerene Bears, MD 03/29/2021 3:10:16 PM This report has been signed electronically. Number of Addenda: 0

## 2021-03-29 NOTE — Consult Note (Signed)
Consultation  Referring Provider: Dr. Erlinda Hong    Primary Care Physician:  Hoyt Koch, MD Primary Gastroenterologist: Dr. Silverio Decamp        Reason for Consultation: Anemia, melena            HPI:   Paula Ward is a 85 y.o. female with a past medical history of upper GI bleeding in the setting of angiodysplasia of the stomach/duodenum as well as gastrointestinal AVMs, chronic anemia associated baseline hemoglobin 8.5-9.5, GERD, stage IIIb chronic kidney disease, pulmonary hypertension, interstitial lung disease and multiple others, who presented to the ER on 03/28/2021 with a complaint of generalized weakness.    Today, the patient describes that she has had 2 to 3 days of generalized weakness and new development of dark-colored stool, 1-2 episode per day.  Tells me today this seems exactly like her prior episodes of GI bleeds.  Associated symptoms include 1 to 2 days of shortness of breath.    Denies fever, weight loss or symptoms that awaken her from sleep  ED course: Oxygen saturation 89% on room air, blood pressure 138/50-150/58, labs with a sodium of 134, BUN 28, creatinine 1.32, CBC with a hemoglobin of 7.1 (9.3 on 02/23/2021)  GI history:  03/27/2021 telephone call: Patient called to state that she was concerned because she was having dark stools again  01/21/2021 patient called our office: Sent in a refill of Carafate suspension 1 g before meals and at bedtime  12/25/2020 office visit Alonza Bogus for anemia: At that time was discussing anemia as well as complaints of extreme fatigue, hemoglobin fairly stable at 8.7, last iron studies in normal limits although lower than previous, at that time no sign of any GI bleeding with black or bloody stools; it was thought that we should just monitor her for her chronic anemia and fatigue.  Possibly related to extremely low blood pressure  05/2018 small bowel endoscopy: - A single bleeding angiodysplastic lesion/dieulafoy lesion in the  duodenum. Injected s/p BICAP followed by endoscopic clipping. - A few non-bleeding angiodysplastic lesions in the duodenum. - Small hiatal hernia. - No specimens collected.   12/2015 small bowel endoscopy: - Two non-bleeding angioectasias in the jejunum. Treated with argon plasma coagulation (APC). - One recently bleeding angioectasia in the duodenum. Treated with argon plasma coagulation (APC). - Normal esophagus. - Normal stomach. - No specimens collected.   06/2014 colonoscopy: 1. Sessile polyp was found in the ascending colon; polypectomy was performed with cold forceps 2. There was moderate diverticulosis noted throughout the entire examined colon ,more severe in the left colon 3. no evidence of AVMs 4. nothing to account for right lower quadrant abdominal pain. Suspect adhesions  Past Medical History:  Diagnosis Date  . Anemia   . Angiodysplasia of stomach and duodenum   . Arthus phenomenon   . AVM (arteriovenous malformation)   . Blood transfusion without reported diagnosis    last 4 units 12-22-15, Iron infusion x2 last -01-07-16,01-14-16.  . Breast cancer ([place]) 1989   Left  . Candida esophagitis ([place])   . Cataract   . Chronic kidney disease    Chronic mild renal insuffiency  . CREST syndrome ([place])   . GERD (gastroesophageal reflux disease)    w/ HH  . Interstitial lung disease ([place])   . Nodule of kidney   . PONV (postoperative nausea and vomiting)   . Pulmonary embolus ([place]) 2003  . Pulmonary hypertension ([place])    followed by Dr Gaynell Face at  [facility], now Dr. Maryjean Ka visit end 2'17.  . Rectal incontinence   . Renal cell carcinoma ([place])   . Scleroderma ([place])   . Tubular adenoma of colon   . Uterine polyp     Past Surgical History:  Procedure Laterality Date  . APPENDECTOMY  1962  . BREAST LUMPECTOMY  1989   left  . CATARACT EXTRACTION, BILATERAL Bilateral 12/2013  . ENTEROSCOPY N/A 01/18/2016   Procedure: ENTEROSCOPY;  Surgeon: Mauri Pole, MD;   Location: [facility];  Service: Endoscopy;  Laterality: N/A;  . ENTEROSCOPY N/A 05/24/2018   Procedure: ENTEROSCOPY;  Surgeon: Jackquline Denmark, MD;  Location: [facility];  Service: Endoscopy;  Laterality: N/A;  . ESOPHAGOGASTRODUODENOSCOPY (EGD) WITH PROPOFOL N/A 12/21/2015   Procedure: ESOPHAGOGASTRODUODENOSCOPY (EGD) WITH PROPOFOL;  Surgeon: Irene Shipper, MD;  Location: [facility];  Service: Endoscopy;  Laterality: N/A;  . HOT HEMOSTASIS N/A 05/24/2018   Procedure: HOT HEMOSTASIS (ARGON PLASMA COAGULATION/BICAP);  Surgeon: Jackquline Denmark, MD;  Location: [facility];  Service: Endoscopy;  Laterality: N/A;  . IR GENERIC HISTORICAL  06/05/2016   IR RADIOLOGIST EVAL & MGMT 06/05/2016 Aletta Edouard, MD [facility] INTERV RAD  . IVC Filter    . KIDNEY SURGERY     left -"laser surgery by Dr. Kathlene Cote- 5 yrs ago" no removal  . SCHLEROTHERAPY  05/24/2018   Procedure: Woodward Ku;  Surgeon: Jackquline Denmark, MD;  Location: [facility];  Service: Endoscopy;;  . TONSILLECTOMY    . TUBAL LIGATION      Family History  Problem Relation Age of Onset  . Bladder Cancer Father   . Diabetes Father   . Prostate cancer Father   . Alzheimer's disease Mother   . Diabetes Sister   . Lung cancer Sister         smoker  . Esophageal cancer Paternal Uncle   . Colon cancer Neg Hx     Social History   Tobacco Use  . Smoking status: Never  . Smokeless tobacco: Never  Vaping Use  . Vaping Use: Never used  Substance Use Topics  . Alcohol use: No  . Drug use: No    Prior to Admission medications   Medication Sig Start Date End Date Taking? Authorizing Provider  acetaminophen (TYLENOL) 325 MG tablet Take 650 mg by mouth every 6 (six) hours as needed for mild pain.    Yes [provider]  albuterol (VENTOLIN HFA) 108 (90 Base) MCG/ACT inhaler INHALE 2 PUFFS INTO THE LUNGS EVERY 6 HOURS AS NEEDED FOR WHEEZING OR SHORTNESS OF BREATH Patient taking differently: Inhale 2 puffs into the lungs every 6 (six)  hours as needed for wheezing or shortness of breath. 03/06/20  Yes Hoyt Koch, MD  ALPRAZolam Duanne Moron) 0.25 MG tablet Take 0.5 tablets (0.125 mg total) by mouth at bedtime as needed. Patient taking differently: Take 0.125 mg by mouth at bedtime as needed for anxiety. 10/29/20  Yes Hoyt Koch, MD  atovaquone [facility]) 750 MG/5ML suspension Take 750 mg by mouth at bedtime. 07/10/20  Yes [provider]  diltiazem (CARDIZEM CD) 120 MG 24 hr capsule Take 120 mg by mouth daily.   Yes [provider]  fluticasone (FLONASE) 50 MCG/ACT nasal spray INSTILL 1 SPRAY INTO EACH NOSTRIL TWICE A DAY IF NEEDED Patient taking differently: Place 1 spray into both nostrils daily. 09/18/20  Yes Hoyt Koch, MD  furosemide (LASIX) 20 MG tablet Take 1 tablet (20 mg total) by mouth daily. Patient taking differently: Take 10 mg  by mouth daily. 03/05/21  Yes Burns, Claudina Lick, MD  montelukast (SINGULAIR) 10 MG tablet TAKE 1 TABLET(10 MG) BY MOUTH AT BEDTIME Patient taking differently: Take 10 mg by mouth at bedtime. 01/07/21  Yes Hoyt Koch, MD  Mouthwashes (DRY MOUTH MOUTHWASH) LIQD Use as directed 5 mLs in the mouth or throat as needed (dry mouth). Patient taking differently: Use as directed 5 mLs in the mouth or throat daily as needed (dry mouth). Biotene 02/15/18  Yes Hoyt Koch, MD  Multiple Vitamin (MULTIVITAMIN WITH MINERALS) TABS tablet Take 1 tablet by mouth daily. NO IRON   Yes [provider]  OPSUMIT 10 MG TABS Take 10 mg by mouth daily. 08/16/15  Yes [provider]  pantoprazole (PROTONIX) 40 MG tablet TAKE 1 TABLET(40 MG) BY MOUTH TWICE DAILY Patient taking differently: Take 40 mg by mouth daily as needed (heartburn). 12/24/20  Yes Nandigam, Venia Minks, MD  predniSONE (DELTASONE) 1 MG tablet Take 1 mg by mouth in the morning, at noon, in the evening, and at bedtime.   Yes [provider]  spironolactone (ALDACTONE) 25 MG  tablet Take 12.5 mg by mouth daily. 06/09/20  Yes [provider]  sucralfate (CARAFATE) 1 GM/10ML suspension TAKE 10 MLS BY MOUTH AT BEDTIME Patient taking differently: Take 1 g by mouth daily. 09/17/20  Yes Mauri Pole, MD  ACCU-CHEK AVIVA PLUS test strip  06/04/20   [provider]  bacitracin ointment Apply 1 application topically 2 (two) times daily. Patient not taking: No sig reported 10/10/18   Charlesetta Shanks, MD  lidocaine (XYLOCAINE) 2 % solution Use as directed 5 mLs in the mouth or throat every 3 (three) hours as needed for mouth pain. Patient not taking: No sig reported 10/19/18   Hoyt Koch, MD  meclizine (ANTIVERT) 12.5 MG tablet Take 1-2 tablets (12.5-25 mg total) by mouth 3 (three) times daily as needed for dizziness. Patient not taking: Reported on 03/28/2021 10/26/17   Binnie Rail, MD  mupirocin cream (BACTROBAN) 2 % Apply 1 application topically 2 (two) times daily. Patient not taking: Reported on 03/28/2021 03/22/18   Binnie Rail, MD  omeprazole (PRILOSEC) 40 MG capsule TAKE 1 CAPSULE(40 MG) BY MOUTH DAILY Patient not taking: No sig reported 08/13/20   Mauri Pole, MD  pilocarpine (SALAGEN) 5 MG tablet TAKE 1 TABLET(5 MG) BY MOUTH THREE TIMES DAILY AS NEEDED Patient not taking: No sig reported 12/19/19   Hoyt Koch, MD    Current Facility-Administered Medications  Medication Dose Route Frequency Provider Last Rate Last Admin  . acetaminophen (TYLENOL) tablet 650 mg  650 mg Oral Q6H PRN Howerter, Justin B, DO       Or  . acetaminophen (TYLENOL) suppository 650 mg  650 mg Rectal Q6H PRN Howerter, Justin B, DO      . albuterol (PROVENTIL) (2.5 MG/3ML) 0.083% nebulizer solution 3 mL  3 mL Inhalation Q6H PRN Howerter, Justin B, DO      . fluticasone (FLONASE) 50 MCG/ACT nasal spray 1 spray  1 spray Each Nare Daily Howerter, Justin B, DO      . lip balm (CARMEX) ointment   Topical PRN Howerter, Justin B, DO   Given at 03/28/21  2231  . LORazepam (ATIVAN) injection 0.5 mg  0.5 mg Intravenous Q6H PRN Howerter, Justin B, DO      . macitentan (OPSUMIT) tablet 10 mg  10 mg Oral Daily Howerter, Justin B, DO      .  montelukast (SINGULAIR) tablet 10 mg  10 mg Oral QHS Howerter, Justin B, DO      . pantoprazole (PROTONIX) 80 mg in sodium chloride 0.9 % 100 mL (0.8 mg/mL) infusion  8 mg/hr Intravenous Continuous Howerter, Justin B, DO 10 mL/hr at 03/29/21 0629 8 mg/hr at 03/29/21 0629  . predniSONE (DELTASONE) tablet 1 mg  1 mg Oral 4 times per day Howerter, Justin B, DO        Allergies as of 03/28/2021 - Review Complete 03/28/2021  Allergen Reaction Noted  . Codeine Nausea Only 08/03/2008  . Other Nausea And Vomiting 04/05/2012  . Erythromycin Nausea And Vomiting 10/17/2014  . Lisinopril  12/14/2014  . Mycophenolate mofetil Nausea Only 09/23/2019  . Sulfa antibiotics Other (See Comments) 03/28/2021     Review of Systems:    Constitutional: No weight loss, fever or chills Skin: No rash  Cardiovascular: No chest pain Respiratory: +SOB Gastrointestinal: See HPI and otherwise negative Genitourinary: No dysuria  Neurological: No headache, dizziness or syncope Musculoskeletal: No new muscle or joint pain Hematologic: No bruising Psychiatric: No history of depression or anxiety    Physical Exam:  Vital signs in last 24 hours: Temp:  [98.1 F (36.7 C)-98.7 F (37.1 C)] 98.3 F (36.8 C) (06/10 0800) Pulse Rate:  [72-84] 79 (06/10 0800) Resp:  [14-24] 14 (06/10 0404) BP: (131-150)/(50-70) 135/55 (06/10 0800) SpO2:  [89 %-99 %] 99 % (06/10 0800) Weight:  [50.3 kg-50.6 kg] 50.3 kg (06/10 0500)   General:   Pleasant elderly, frail appearing, Caucasian female appears to be in NAD, Well developed, Well nourished, alert and cooperative Head:  Normocephalic and atraumatic. Eyes:   PEERL, EOMI. No icterus. Conjunctiva pink. Ears:  Normal auditory acuity. Neck:  Supple Throat: Oral cavity and pharynx without  inflammation, swelling or lesion.  Lungs: Respirations even and unlabored. Lungs clear to auscultation bilaterally.   No wheezes, crackles, or rhonchi.  Heart: Normal S1, S2. No MRG. Regular rate and rhythm. No peripheral edema, cyanosis or pallor.  Abdomen:  Soft, nondistended, nontender. No rebound or guarding. Normal bowel sounds. No appreciable masses or hepatomegaly. Rectal:  Not performed.  Msk:  Symmetrical without gross deformities. Peripheral pulses intact.  Extremities:  Without edema, no deformity or joint abnormality.  Neurologic:  Alert and  oriented x4;  grossly normal neurologically.  Skin:   Dry and intact without significant lesions or rashes. Psychiatric: Demonstrates good judgement and reason without abnormal affect or behaviors.   LAB RESULTS: Recent Labs    03/28/21 1356 03/28/21 2039 03/29/21 0534  WBC 7.6  --  5.3  HGB 7.1* 6.4* 7.6*  HCT 22.6* 21.1* 24.7*  PLT 324  --  146*   BMET Recent Labs    03/28/21 1356 03/29/21 0534  NA 134* 137  K 4.2 4.2  CL 100 102  CO2 25 25  GLUCOSE 103* 77  BUN 28* 25*  CREATININE 1.32* 1.10*  CALCIUM 8.8* 8.6*   LFT Recent Labs    03/29/21 0534  PROT 5.7*  ALBUMIN 3.5  AST 23  ALT 13  ALKPHOS 31*  BILITOT 0.8   PT/INR Recent Labs    03/28/21 2039 03/29/21 0534  LABPROT 13.6 14.6  INR 1.0 1.1    STUDIES: DG Chest 2 View  Result Date: 03/28/2021 CLINICAL DATA:  Weakness EXAM: CHEST - 2 VIEW COMPARISON:  March 22, 2021 FINDINGS: There is cardiomegaly with pulmonary venous hypertension. There is a degree of interstitial thickening without frank edema or consolidation. There  is no appreciable adenopathy. There is aortic atherosclerosis. There are surgical clips in the left breast and left axillary regions. 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 IMPRESSION: Rather marked cardiomegaly with pulmonary vascular congestion. A degree of underlying pericardial effusion on the left cannot be excluded. No frank edema or  consolidation. Suspect underlying chronic bronchitis given interstitial thickening. Postoperative change left breast. Aortic Atherosclerosis (ICD10-I70.0). Electronically Signed   By: Lowella Grip III M.D.   On: 03/28/2021 14:11     Impression / Plan:   Impression: 1.  Upper GI bleed: Melena for the past 2 to 3 days, prior history of small bowel AVMs; likely small bowel AVMs 2.  Acute on chronic anemia: With above, hemoglobin 7.1 (baseline 8.5-9.5/9.3 on 03/05/2021)--> 1 unit PRBCs--> 7.6 3.  Generalized weakness: With above 4.  Acute on chronic diastolic heart failure 5.  Acute hypoxic respiratory distress: Requiring 2 L nasal cannula 6.  Stage IIIb chronic kidney disease  Plan: 1.  Scheduled patient for an enteroscopy this afternoon at 3:00 with Dr. Hilarie Fredrickson.  Did discuss risks, benefits, limitations and alternatives and patient agrees to proceed. 2.  Patient remained n.p.o. until after time of procedure this afternoon. 3.  Okay to continue Protonix infusion for now, but likely this can be decreased to 40 mg twice daily pending findings. 4.  Please await any further recommendations from Dr. Hilarie Fredrickson after time of procedure  Thank you for your kind consultation, we will continue to follow.  Lavone Nian [facility]  03/29/2021, 9:37 AM

## 2021-03-30 ENCOUNTER — Inpatient Hospital Stay (HOSPITAL_COMMUNITY): Payer: Medicare PPO

## 2021-03-30 LAB — CBC
HCT: 28.9 % — ABNORMAL LOW (ref 36.0–46.0)
Hemoglobin: 9 g/dL — ABNORMAL LOW (ref 12.0–15.0)
MCH: 32 pg (ref 26.0–34.0)
MCHC: 31.1 g/dL (ref 30.0–36.0)
MCV: 102.8 fL — ABNORMAL HIGH (ref 80.0–100.0)
Platelets: 275 10*3/uL (ref 150–400)
RBC: 2.81 MIL/uL — ABNORMAL LOW (ref 3.87–5.11)
RDW: 21 % — ABNORMAL HIGH (ref 11.5–15.5)
WBC: 10.5 10*3/uL (ref 4.0–10.5)
nRBC: 0 % (ref 0.0–0.2)

## 2021-03-30 LAB — URINALYSIS, ROUTINE W REFLEX MICROSCOPIC
Bilirubin Urine: NEGATIVE
Glucose, UA: NEGATIVE mg/dL
Hgb urine dipstick: NEGATIVE
Ketones, ur: NEGATIVE mg/dL
Leukocytes,Ua: NEGATIVE
Nitrite: NEGATIVE
Protein, ur: NEGATIVE mg/dL
Specific Gravity, Urine: 1.013 (ref 1.005–1.030)
pH: 5 (ref 5.0–8.0)

## 2021-03-30 LAB — BASIC METABOLIC PANEL
Anion gap: 8 (ref 5–15)
BUN: 36 mg/dL — ABNORMAL HIGH (ref 8–23)
CO2: 27 mmol/L (ref 22–32)
Calcium: 8.7 mg/dL — ABNORMAL LOW (ref 8.9–10.3)
Chloride: 101 mmol/L (ref 98–111)
Creatinine, Ser: 1.45 mg/dL — ABNORMAL HIGH (ref 0.44–1.00)
GFR, Estimated: 36 mL/min — ABNORMAL LOW (ref >60)
Glucose, Bld: 82 mg/dL (ref 70–99)
Potassium: 4.8 mmol/L (ref 3.5–5.1)
Sodium: 136 mmol/L (ref 135–145)

## 2021-03-30 MED ORDER — PHENOL 1.4 % MT LIQD
1.0000 | OROMUCOSAL | Status: DC | PRN
  Filled 2021-03-30: qty 177

## 2021-03-30 MED ORDER — DILTIAZEM HCL ER COATED BEADS 120 MG PO CP24
120.0000 mg | ORAL_CAPSULE | Freq: Every day | ORAL | Status: DC
  Administered 2021-03-30 – 2021-04-01 (×3): 120 mg via ORAL
  Filled 2021-03-30 (×3): qty 1

## 2021-03-30 MED ORDER — HYDROCORTISONE 0.5 % EX CREA
TOPICAL_CREAM | CUTANEOUS | Status: DC | PRN
  Filled 2021-03-30: qty 28.35

## 2021-03-30 MED ORDER — ATOVAQUONE 750 MG/5ML PO SUSP
750.0000 mg | Freq: Every day | ORAL | Status: DC
  Administered 2021-03-30 – 2021-03-31 (×2): 750 mg via ORAL
  Filled 2021-03-30 (×2): qty 5

## 2021-03-30 NOTE — Progress Notes (Signed)
Progress Note   Subjective  Patient has a mild sore throat today No melena or bloody stools today No abdominal pain She is hungry and still waiting on breakfast Husband at bedside Received the second unit of packed red cells since admission yesterday evening   Objective  Vital signs in last 24 hours: Temp:  [97.6 F (36.4 C)-98.8 F (37.1 C)] 98.1 F (36.7 C) (06/11 0433) Pulse Rate:  [76-100] 100 (06/11 0433) Resp:  [16-24] 20 (06/11 0433) BP: (100-148)/(22-59) 117/54 (06/11 0433) SpO2:  [91 %-100 %] 94 % (06/11 0433) Weight:  [50.3 kg-53.2 kg] 53.2 kg (06/11 0441)    Gen: awake, alert, NAD HEENT: anicteric, op clear CV: RRR, no mrg Pulm: CTA b/l Abd: soft, NT/ND, +BS throughout Ext: no c/c/e Neuro: nonfocal   Intake/Output from previous day: 06/10 0701 - 06/11 0700 In: 1196 [I.V.:900; Blood:296] Out: -  Intake/Output this shift: No intake/output data recorded.  Lab Results: Recent Labs    03/28/21 1356 03/28/21 2039 03/29/21 0534 03/30/21 0550  WBC 7.6  --  5.3 10.5  HGB 7.1* 6.4* 7.6* 9.0*  HCT 22.6* 21.1* 24.7* 28.9*  PLT 324  --  146* 275   BMET Recent Labs    03/28/21 1356 03/29/21 0534 03/30/21 0550  NA 134* 137 136  K 4.2 4.2 4.8  CL 100 102 101  CO2 _0 GLUCOSE 103* 77 82  BUN 28* 25* 36*  CREATININE 1.32* 1.10* 1.45*  CALCIUM 8.8* 8.6* 8.7*   LFT Recent Labs    03/29/21 0534  PROT 5.7*  ALBUMIN 3.5  AST 23  ALT 13  ALKPHOS 31*  BILITOT 0.8   PT/INR Recent Labs    03/28/21 2039 03/29/21 0534  LABPROT 13.6 14.6  INR 1.0 1.1   Hepatitis Panel No results for input(s): HEPBSAG, HCVAB, HEPAIGM, HEPBIGM in the last 72 hours.  Studies/Results: DG Chest 2 View  Result Date: 03/28/2021 CLINICAL DATA:  Weakness EXAM: CHEST - 2 VIEW COMPARISON:  March 22, 2021 FINDINGS: There is cardiomegaly with pulmonary venous hypertension. There is a degree of interstitial thickening without frank edema or consolidation. There is no  appreciable adenopathy. There is aortic atherosclerosis. There are surgical clips in the left breast and left axillary regions. 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 IMPRESSION: Rather marked cardiomegaly with pulmonary vascular congestion. A degree of underlying pericardial effusion on the left cannot be excluded. No frank edema or consolidation. Suspect underlying chronic bronchitis given interstitial thickening. Postoperative change left breast. Aortic Atherosclerosis (ICD10-I70.0). Electronically Signed   By: Lowella Grip III M.D.   On: 03/28/2021 14:11      Assessment & Recommendations  85 year old female with recurrent acute on chronic anemia in the setting of melenic stools found to have recurrent intestinal angioectasias status post enteroscopy with APC ablation  1.  Recurrent anemia/intestinal angioectasias --enteroscopy yesterday revealed 6 duodenal and jejunal angioectasias which were ablated with APC.  No evidence of recurrent bleeding.  She has responded to packed red cell transfusion. --Once daily PPI --We will need to follow hemoglobin and iron studies after discharge --No plans for further endoscopy at present --Encouraged her to get out of bed and work with physical therapy, expect home soon perhaps tomorrow  2.  IDA --history of iron infusions over time due to problem #1 above.  Iron studies not consistent with low iron on admission though this will need to be rechecked going forward and she should receive periodic IV iron when  needed to support anemia.  There is undoubtedly additional angioectasias in the deeper small intestine out of reach of the enteroscope.  GI will sign off for now, but remain available if needed      LOS: 1 day   Jerene Bears  03/30/2021, 9:19 AM [phone]

## 2021-03-30 NOTE — Plan of Care (Signed)

## 2021-03-30 NOTE — Progress Notes (Addendum)
PROGRESS NOTE    [name]  WJX:[id number] DOB: [birth year] DOA: 03/28/2021 PCP: Hoyt Koch, MD    Chief Complaint  Patient presents with   Abdominal Pain   Melena    Brief Narrative:  Paula Ward is a 85 y.o. female with medical history significant for upper gastrointestinal bleeding in the setting of angiodysplasia of the stomach/duodenum as well as gastrointestinal AVMs, chronic anemia associated baseline hemoglobin 8.5-9.5, chronic pericardial effusion,  stage IIIb chronic kidney disease with baseline creatinine 1.2-1.4, crest syndrome, pulmonary hypertension, interstitial lung disease , who is admitted to [facility] on 03/28/2021 by way of transfer from [place] emergency department for further evaluation management of dark stool and generalized weakness.  Subjective:  Hgb increased to 9, no bm after the procedure, denies pain She continue to require oxygen, denies feeling sob, lung has bilateral crackles, no lower extremity edema  She  appears very  weak   Husband at bedside   Assessment & Plan:   Principal Problem:   Acute upper GI bleed Active Problems:   Essential hypertension   Acute on chronic anemia   Generalized weakness   Acute on chronic diastolic CHF (congestive heart failure) (HCC)   Elevated troponin   Intestinal angiodysplasia   GI bleed   Acute on chronic blood loss anemia -Present with dark color stool and weakness, FOBT positive -Received 2 units PRBC transfusion, with appropriate improvement of hemoglobin -s/p small bowel enteroscopy on 6/10, appreciate gi input, will follow recommendations  Interstitial lung disease/crest syndrome Report she has been on prednisone for the last few month, in the process of tapering prednisone Appear on atovaquone At home as well, resumed  Continue PPI  Acute hypoxic respiratory failure, likely from anemia and possible mild diastolic CHF exacerbation oxygen saturation 89%  on room air, which improved to 98% on 2 L nasal cannula.  She is not on oxygen at home She denies feeling short of breath, no cough, no chest pain, she does has elevated BNP level, high-sensitivity troponin mild and flat on Lasix and spironolactone at home, she received IV Lasix on presentation Wean oxygen, will check ambulatory pulse ox Close monitor volume status, difficult to assess volume status, she dose has lung crackles , no edema, she has poor oral intake, she received two units prbc, she continue to be oxygen dependent,  Will repeat cxr  Addendum: persistent hypoxia, she reports no hypoxia at home, will get ct chest ,check d dimer  CKD 3 A -Creatinine appears close to baseline -Monitor, renal dosing medication  FTT: will get PT    Body mass index is 20.78 kg/m.Marland Kitchen     Unresulted Labs (From admission, onward)     Start     Ordered   03/30/21 0919  Urinalysis, Routine w reflex microscopic  Once,   R        03/30/21 0918   03/28/21 1942  Methylmalonic acid, serum  Add-on,   AD        03/28/21 1941              DVT prophylaxis: SCDs Start: 03/28/21 1937   Code Status: Full Family Communication: Husband at bedside Disposition:   Status is: Inpatient    Dispo: The patient is from: Home              Anticipated d/c is to: Home              Anticipated d/c date is: 28  to 48 hours pending Pt eval and oxygen requirment                Consultants:  GI  Procedures:  EGD planned on 6/10  Antimicrobials:    Anti-infectives (From admission, onward)    Start     Dose/Rate Route Frequency Ordered Stop   03/30/21 2200  atovaquone (MEPRON) 750 MG/5ML suspension 750 mg        750 mg Oral Daily at bedtime 03/30/21 0928            Objective: Vitals:   03/29/21 1828 03/29/21 2037 03/30/21 0433 03/30/21 0441  BP: (!) 124/51 (!) 124/49 (!) 117/54   Pulse: 84 94 100   Resp: _0 Temp: 98.8 F (37.1 C) 98.3 F (36.8 C) 98.1 F (36.7 C)   TempSrc:  Oral Oral Oral   SpO2: 98% 91% 94%   Weight:    53.2 kg  Height:        Intake/Output Summary (Last 24 hours) at 03/30/2021 0930 Last data filed at 03/29/2021 2035 Gross per 24 hour  Intake 1196 ml  Output --  Net 1196 ml   Filed Weights   03/29/21 0500 03/29/21 1311 03/30/21 0441  Weight: 50.3 kg 50.3 kg 53.2 kg    Examination:  General exam: calm, NAD, appear very weak Respiratory system: Fine crackles , no wheezing , no rhonchi , Respiratory effort normal. Cardiovascular system: S1 & S2 heard, RRR. + precordial  murmur, No pedal edema. Gastrointestinal system: Abdomen is nondistended, soft and nontender. Normal bowel sounds heard. Central nervous system: Alert and oriented. No focal neurological deficits. Extremities: Symmetric 5 x 5 power. Skin: No rashes, lesions or ulcers Psychiatry: Judgement and insight appear normal. Mood & affect appropriate.     Data Reviewed: I have personally reviewed following labs and imaging studies  CBC: Recent Labs  Lab 03/28/21 1356 03/28/21 2039 03/29/21 0534 03/30/21 0550  WBC 7.6  --  5.3 10.5  NEUTROABS 6.1  --   --   --   HGB 7.1* 6.4* 7.6* 9.0*  HCT 22.6* 21.1* 24.7* 28.9*  MCV 109.2*  --  105.1* 102.8*  PLT 324  --  146* 673    Basic Metabolic Panel: Recent Labs  Lab 03/28/21 1356 03/28/21 2039 03/29/21 0534 03/30/21 0550  NA 134*  --  137 136  K 4.2  --  4.2 4.8  CL 100  --  102 101  CO2 25  --  25 27  GLUCOSE 103*  --  77 82  BUN 28*  --  25* 36*  CREATININE 1.32*  --  1.10* 1.45*  CALCIUM 8.8*  --  8.6* 8.7*  MG  --  2.1 2.0  --   PHOS  --   --  4.5  --     GFR: Estimated Creatinine Clearance: 24.7 mL/min (A) (by C-G formula based on SCr of 1.45 mg/dL (H)).  Liver Function Tests: Recent Labs  Lab 03/28/21 1356 03/29/21 0534  AST 24 23  ALT 13 13  ALKPHOS 36* 31*  BILITOT 0.5 0.8  PROT 6.5 5.7*  ALBUMIN 4.0 3.5    CBG: No results for input(s): GLUCAP in the last 168 hours.   Recent  Results (from the past 240 hour(s))  Resp Panel by RT-PCR (Flu A&B, Covid) Nasopharyngeal Swab     Status: None   Collection Time: 03/28/21  3:10 PM   Specimen: Nasopharyngeal Swab; Nasopharyngeal(NP) swabs in vial transport medium  Result Value Ref Range Status   SARS Coronavirus 2 by RT PCR NEGATIVE NEGATIVE Final    Comment: (NOTE) SARS-CoV-2 target nucleic acids are NOT DETECTED.  The SARS-CoV-2 RNA is generally detectable in upper respiratory specimens during the acute phase of infection. The lowest concentration of SARS-CoV-2 viral copies this assay can detect is 138 copies/mL. A negative result does not preclude SARS-Cov-2 infection and should not be used as the sole basis for treatment or other patient management decisions. A negative result may occur with  improper specimen collection/handling, submission of specimen other than nasopharyngeal swab, presence of viral mutation(s) within the areas targeted by this assay, and inadequate number of viral copies(<138 copies/mL). A negative result must be combined with clinical observations, patient history, and epidemiological information. The expected result is Negative.  Fact Sheet for Patients:  EntrepreneurPulse.com.au  Fact Sheet for Healthcare Providers:  IncredibleEmployment.be  This test is no t yet approved or cleared by the [place] FDA and  has been authorized for detection and/or diagnosis of SARS-CoV-2 by FDA under an Emergency Use Authorization (EUA). This EUA will remain  in effect (meaning this test can be used) for the duration of the COVID-19 declaration under Section 564(b)(1) of the Act, 21 U.S.C.section 360bbb-3(b)(1), unless the authorization is terminated  or revoked sooner.       Influenza A by PCR NEGATIVE NEGATIVE Final   Influenza B by PCR NEGATIVE NEGATIVE Final    Comment: (NOTE) The Xpert Xpress SARS-CoV-2/FLU/RSV plus assay is intended as an aid in the  diagnosis of influenza from Nasopharyngeal swab specimens and should not be used as a sole basis for treatment. Nasal washings and aspirates are unacceptable for Xpert Xpress SARS-CoV-2/FLU/RSV testing.  Fact Sheet for Patients: EntrepreneurPulse.com.au  Fact Sheet for Healthcare Providers: IncredibleEmployment.be  This test is not yet approved or cleared by the [place] FDA and has been authorized for detection and/or diagnosis of SARS-CoV-2 by FDA under an Emergency Use Authorization (EUA). This EUA will remain in effect (meaning this test can be used) for the duration of the COVID-19 declaration under Section 564(b)(1) of the Act, 21 U.S.C. section 360bbb-3(b)(1), unless the authorization is terminated or revoked.  Performed at [facility], [address] [place], [place] [address]          Radiology Studies: DG Chest 2 View  Result Date: 03/28/2021 CLINICAL DATA:  Weakness EXAM: CHEST - 2 VIEW COMPARISON:  March 22, 2021 FINDINGS: There is cardiomegaly with pulmonary venous hypertension. There is a degree of interstitial thickening without frank edema or consolidation. There is no appreciable adenopathy. There is aortic atherosclerosis. There are surgical clips in the left breast and left axillary regions. 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 IMPRESSION: Rather marked cardiomegaly with pulmonary vascular congestion. A degree of underlying pericardial effusion on the left cannot be excluded. No frank edema or consolidation. Suspect underlying chronic bronchitis given interstitial thickening. Postoperative change left breast. Aortic Atherosclerosis (ICD10-I70.0). Electronically Signed   By: Lowella Grip III M.D.   On: 03/28/2021 14:11        Scheduled Meds:  atovaquone  750 mg Oral QHS   diltiazem  120 mg Oral Daily   fluticasone  1 spray Each Nare Daily   macitentan  10 mg Oral Daily   montelukast  10 mg Oral QHS    pantoprazole  40 mg Oral Daily   predniSONE  1 mg Oral 4 times per day   Continuous Infusions:  LOS: 1 day   Time spent: 35 mins Greater than 50% of this time was spent in counseling, explanation of diagnosis, planning of further management, and coordination of care.   Voice Recognition Viviann Spare dictation system was used to create this note, attempts have been made to correct errors. Please contact the author with questions and/or clarifications.   Florencia Reasons, MD PhD FACP [facility]  Available via Epic secure chat 7am-7pm for nonurgent issues Please page for urgent issues To page the attending provider between 7A-7P or the covering provider during after hours 7P-7A, please log into the web site [email or web address] and access using universal [place] password for that web site. If you do not have the password, please call the hospital operator.    03/30/2021, 9:30 AM

## 2021-03-31 LAB — BASIC METABOLIC PANEL
Anion gap: 8 (ref 5–15)
BUN: 37 mg/dL — ABNORMAL HIGH (ref 8–23)
CO2: 26 mmol/L (ref 22–32)
Calcium: 8.7 mg/dL — ABNORMAL LOW (ref 8.9–10.3)
Chloride: 100 mmol/L (ref 98–111)
Creatinine, Ser: 1.31 mg/dL — ABNORMAL HIGH (ref 0.44–1.00)
GFR, Estimated: 41 mL/min — ABNORMAL LOW (ref >60)
Glucose, Bld: 98 mg/dL (ref 70–99)
Potassium: 4.3 mmol/L (ref 3.5–5.1)
Sodium: 134 mmol/L — ABNORMAL LOW (ref 135–145)

## 2021-03-31 LAB — D-DIMER, QUANTITATIVE: D-Dimer, Quant: 1.62 ug/mL-FEU — ABNORMAL HIGH (ref 0.00–0.50)

## 2021-03-31 LAB — PROCALCITONIN: Procalcitonin: <0.1 ng/mL

## 2021-03-31 MED ORDER — DIPHENHYDRAMINE HCL 50 MG/ML IJ SOLN
12.5000 mg | Freq: Three times a day (TID) | INTRAMUSCULAR | Status: DC | PRN
Start: 2021-03-31 — End: 2021-04-01
  Administered 2021-03-31: 12.5 mg via INTRAVENOUS
  Filled 2021-03-31: qty 1

## 2021-03-31 MED ORDER — ENSURE ENLIVE PO LIQD: 237.0000 mL | ORAL | Status: DC

## 2021-03-31 NOTE — Evaluation (Signed)
Physical Therapy Evaluation Patient Details Name: Paula Ward MRN: [id number] DOB: 04-21-1936 Today's Date: 03/31/2021   History of Present Illness  Pt admitted with upper GIB and acute on chronic CHF.  Ptwith hx of CKD, breast CA, interstiail lung disease, PE, renal cell CA, Scleroderma, chronic anemia, CKD and CHF  Clinical Impression  Pt admitted as above and presenting with functional mobility limitations 2* generalized weakness and ambulatory balance deficits.  This date, pt ambulated in halls requiring use of RW  for stability and desat to 85% on RA but denies symptoms.  Pt should progress to dc home with family assist.    Follow Up Recommendations No PT follow up    Equipment Recommendations  Other (comment) (TBD)    Recommendations for Other Services       Precautions / Restrictions Precautions Precautions: Fall Restrictions Weight Bearing Restrictions: No      Mobility  Bed Mobility Overal bed mobility: Modified Independent                  Transfers Overall transfer level: Needs assistance Equipment used: None Transfers: Sit to/from Stand Sit to Stand: Min guard         General transfer comment: steady assist only  Ambulation/Gait Ambulation/Gait assistance: Min assist Gait Distance (Feet): 400 Feet Assistive device: Rolling walker (2 wheeled);1 person hand held assist Gait Pattern/deviations: Step-through pattern;Decreased step length - right;Decreased step length - left;Shuffle;Trunk flexed Gait velocity: mod pace   General Gait Details: Pt ambulated 100' with HHA  and noted instability and increased fall risk.  Pt ambulated additional 300' with RW and min guard to close sup and with noted improvement in stability  Stairs            Wheelchair Mobility    Modified Rankin (Stroke Patients Only)       Balance Overall balance assessment: Needs assistance Sitting-balance support: Feet supported;No upper extremity supported Sitting  balance-Leahy Scale: Good     Standing balance support: No upper extremity supported Standing balance-Leahy Scale: Fair                               Pertinent Vitals/Pain Pain Assessment: No/denies pain    Home Living Family/patient expects to be discharged to:: Private residence Living Arrangements: Spouse/significant other Available Help at Discharge: Family Type of Home: House Home Access: Stairs to enter Entrance Stairs-Rails: Right Entrance Stairs-Number of Steps: 2 Home Layout: One level Home Equipment: Cane - single point      Prior Function Level of Independence: Needs assistance   Gait / Transfers Assistance Needed: SPC as needed           Hand Dominance        Extremity/Trunk Assessment   Upper Extremity Assessment Upper Extremity Assessment: Overall WFL for tasks assessed    Lower Extremity Assessment Lower Extremity Assessment: Generalized weakness       Communication   Communication: No difficulties  Cognition Arousal/Alertness: Awake/alert Behavior During Therapy: WFL for tasks assessed/performed;Impulsive Overall Cognitive Status: Within Functional Limits for tasks assessed                                        General Comments      Exercises     Assessment/Plan    PT Assessment Patient needs continued PT services  PT Problem List  Decreased strength;Decreased range of motion;Decreased activity tolerance;Decreased mobility;Decreased balance;Decreased knowledge of use of DME       PT Treatment Interventions DME instruction;Gait training;Stair training;Functional mobility training;Therapeutic activities;Therapeutic exercise;Patient/family education;Balance training    PT Goals (Current goals can be found in the Care Plan section)  Acute Rehab PT Goals Patient Stated Goal: HOME without O2 PT Goal Formulation: With patient Time For Goal Achievement: 04/14/21 Potential to Achieve Goals: Good     Frequency Min 3X/week   Barriers to discharge        Co-evaluation               AM-PAC PT "6 Clicks" Mobility  Outcome Measure Help needed turning from your back to your side while in a flat bed without using bedrails?: None Help needed moving from lying on your back to sitting on the side of a flat bed without using bedrails?: None Help needed moving to and from a bed to a chair (including a wheelchair)?: A Little Help needed standing up from a chair using your arms (e.g., wheelchair or bedside chair)?: A Little Help needed to walk in hospital room?: A Little Help needed climbing 3-5 steps with a railing? : A Little 6 Click Score: 20    End of Session Equipment Utilized During Treatment: Gait belt;Oxygen Activity Tolerance: Patient tolerated treatment well Patient left: in chair;with call bell/phone within reach;with chair alarm set Nurse Communication: Mobility status PT Visit Diagnosis: Unsteadiness on feet (R26.81);Difficulty in walking, not elsewhere classified (R26.2)    Time: 1499-6924 PT Time Calculation (min) (ACUTE ONLY): 19 min   Charges:   PT Evaluation $PT Eval Low Complexity: 1 Low          [place] Pager [phone] Office [phone]   Samarie Pinder 03/31/2021, 1:59 PM

## 2021-03-31 NOTE — Progress Notes (Signed)
SATURATION QUALIFICATIONS: (This note is used to comply with regulatory documentation for home oxygen)  Patient Saturations on Room Air at Rest = 91%  Patient Saturations on Room Air while Ambulating = 85%  Patient Saturations on 2 Liters of oxygen while Ambulating = 94%  Please briefly explain why patient needs home oxygen: Pt  desating to 85% on RA with activity and with HR elevated to 112.  Pt does deny any symptoms of desat.

## 2021-03-31 NOTE — Progress Notes (Addendum)
PROGRESS NOTE    Paula Ward  NOM:[id number] DOB: 05-Apr-1936 DOA: 03/28/2021 PCP: Hoyt Koch, MD    Chief Complaint  Patient presents with   Abdominal Pain   Melena    Brief Narrative:  Paula Ward is a 85 y.o. female with medical history significant for upper gastrointestinal bleeding in the setting of angiodysplasia of the stomach/duodenum as well as gastrointestinal AVMs, chronic anemia associated baseline hemoglobin 8.5-9.5, chronic pericardial effusion,  stage IIIb chronic kidney disease with baseline creatinine 1.2-1.4, crest syndrome, pulmonary hypertension, interstitial lung disease , who is admitted to [facility] on 03/28/2021 by way of transfer from [place] emergency department for further evaluation management of dark stool and generalized weakness.  Subjective:  02 dropped to 86% during sleep last night, o2 dropped 86% while ambulation, she did not feel sob, she does not like the idea of going home on home 02 Reports 76mn walk 6 month ago was normal She denies new cough, she has chronic cough from acid reflux Denies chest pain, no lower extremity edema C/o feeling weak, reports no at baseline, reports she has been feeling weak for two weeks Reports unintentional weight loss of 15lbs in the last two yrs  Assessment & Plan:   Principal Problem:   Acute upper GI bleed Active Problems:   Essential hypertension   Acute on chronic anemia   Generalized weakness   Acute on chronic diastolic CHF (congestive heart failure) (HCC)   Elevated troponin   Intestinal angiodysplasia   GI bleed   Acute on chronic blood loss anemia, reason for admission -Present with dark color stool and weakness, FOBT positive -Received 2 units PRBC transfusion, with appropriate improvement of hemoglobin -s/p small bowel enteroscopy on 6/10, appreciate gi input   Acute hypoxic respiratory failure likely multifactorial,  including anemia ,  interstitial  lung disease, pulmonary hypertension and chronic large pericardioeffusion Noticed nocturnal hypoxia and hypoxia with activity, no hypoxia at rest She was on home oxygen briefly in the past , was not on home o2 prior to this admission She denies feeling short of breath when o2 dropped to 86% while ambulating, she has no  new cough, no chest pain,  she does has elevated BNP level, high-sensitivity troponin mild and flat she dose has lung crackles which is chronic from ILD, she has  no edema, she has poor oral intake on Lasix prn and spironolactone daily at home, she received IV Lasix on presentation, has not need lasix/spinolactone in the last two days,  no volume overload on exam and her cr is slightly elevated ,  cr has improved, likely able to resume lasix/spironolactone tomorrow Encourage incentive spirometer  Interstitial lung disease/crest syndrome Report she has been on prednisone for the last few month, in the process of tapering prednisone with a plan to taper off in the next 1-2 months Appear on atovaquone At home as well, resumed  Continue PPI Left lower lobe opacity on imaging She has no fever, no leukocytosis, no new cough, procalcitonin less than 0.1 Encourage incentive spirometer, monitor off abx  Pulmonary hypertension Followed at [facility], on opsumit    Lung nodules: need to repeat ct chest in one year   Possible renal cell carcinoma , per recent [facility] record from 02/2021  CKD 3 A -Creatinine appears close to baseline -Monitor, renal dosing medication  FTT: PT eval pending     Body mass index is 20.78 kg/m..Marland Kitchen    Unresulted Labs (From admission,  onward)     Start     Ordered   03/31/21 0500  Procalcitonin  Daily,   R     Question:  Specimen collection method  Answer:  Lab=Lab collect   03/30/21 1934   03/28/21 1942  Methylmalonic acid, serum  Add-on,   AD        03/28/21 1941              DVT prophylaxis: SCDs Start: 03/28/21 1937   Code Status:  Full Family Communication: Husband at bedside on 6/10, 6/11 Disposition:   Status is: Inpatient   Dispo: The patient is from: Home              Anticipated d/c is to: Home              Anticipated d/c date is: on 6/13 with home health and home oxygen                Consultants:  GI  Procedures:  EGD planned on 6/10  Antimicrobials:    Anti-infectives (From admission, onward)    Start     Dose/Rate Route Frequency Ordered Stop   03/30/21 2200  atovaquone (MEPRON) 750 MG/5ML suspension 750 mg        750 mg Oral Daily at bedtime 03/30/21 0928            Objective: Vitals:   03/30/21 1426 03/30/21 1700 03/30/21 2010 03/31/21 0429  BP: (!) 119/46  (!) 126/50 126/67  Pulse: 79  86 79  Resp: _0 Temp:   98.6 F (37 C) 98.3 F (36.8 C)  TempSrc:   Oral Oral  SpO2: (!) 89% 92% 90% 93%  Weight:      Height:       No intake or output data in the 24 hours ending 03/31/21 1210  Filed Weights   03/29/21 0500 03/29/21 1311 03/30/21 0441  Weight: 50.3 kg 50.3 kg 53.2 kg    Examination:  General exam: calm, NAD, appear very weak Respiratory system: Fine crackles ( reports chronic), left lower lobe decreased , no wheezing , no rhonchi , Respiratory effort normal. Cardiovascular system: S1 & S2 heard, RRR. + precordial  murmur ( reports chronic), No pedal edema. Gastrointestinal system: Abdomen is nondistended, soft and nontender. Normal bowel sounds heard. Central nervous system: Alert and oriented. No focal neurological deficits. Extremities: generalized weakness Skin: No rashes, lesions or ulcers Psychiatry: Judgement and insight appear normal. Mood & affect appropriate.     Data Reviewed: I have personally reviewed following labs and imaging studies  CBC: Recent Labs  Lab 03/28/21 1356 03/28/21 2039 03/29/21 0534 03/30/21 0550  WBC 7.6  --  5.3 10.5  NEUTROABS 6.1  --   --   --   HGB 7.1* 6.4* 7.6* 9.0*  HCT 22.6* 21.1* 24.7* 28.9*  MCV 109.2*   --  105.1* 102.8*  PLT 324  --  146* 047    Basic Metabolic Panel: Recent Labs  Lab 03/28/21 1356 03/28/21 2039 03/29/21 0534 03/30/21 0550 03/31/21 0839  NA 134*  --  137 136 134*  K 4.2  --  4.2 4.8 4.3  CL 100  --  102 101 100  CO2 25  --  _1 GLUCOSE 103*  --  77 82 98  BUN 28*  --  25* 36* 37*  CREATININE 1.32*  --  1.10* 1.45* 1.31*  CALCIUM 8.8*  --  8.6* 8.7* 8.7*  MG  --  2.1 2.0  --   --   PHOS  --   --  4.5  --   --     GFR: Estimated Creatinine Clearance: 27.4 mL/min (A) (by C-G formula based on SCr of 1.31 mg/dL (H)).  Liver Function Tests: Recent Labs  Lab 03/28/21 1356 03/29/21 0534  AST 24 23  ALT 13 13  ALKPHOS 36* 31*  BILITOT 0.5 0.8  PROT 6.5 5.7*  ALBUMIN 4.0 3.5    CBG: No results for input(s): GLUCAP in the last 168 hours.   Recent Results (from the past 240 hour(s))  Resp Panel by RT-PCR (Flu A&B, Covid) Nasopharyngeal Swab     Status: None   Collection Time: 03/28/21  3:10 PM   Specimen: Nasopharyngeal Swab; Nasopharyngeal(NP) swabs in vial transport medium  Result Value Ref Range Status   SARS Coronavirus 2 by RT PCR NEGATIVE NEGATIVE Final    Comment: (NOTE) SARS-CoV-2 target nucleic acids are NOT DETECTED.  The SARS-CoV-2 RNA is generally detectable in upper respiratory specimens during the acute phase of infection. The lowest concentration of SARS-CoV-2 viral copies this assay can detect is 138 copies/mL. A negative result does not preclude SARS-Cov-2 infection and should not be used as the sole basis for treatment or other patient management decisions. A negative result may occur with  improper specimen collection/handling, submission of specimen other than nasopharyngeal swab, presence of viral mutation(s) within the areas targeted by this assay, and inadequate number of viral copies(<138 copies/mL). A negative result must be combined with clinical observations, patient history, and epidemiological information. The  expected result is Negative.  Fact Sheet for Patients:  EntrepreneurPulse.com.au  Fact Sheet for Healthcare Providers:  IncredibleEmployment.be  This test is no t yet approved or cleared by the [place] FDA and  has been authorized for detection and/or diagnosis of SARS-CoV-2 by FDA under an Emergency Use Authorization (EUA). This EUA will remain  in effect (meaning this test can be used) for the duration of the COVID-19 declaration under Section 564(b)(1) of the Act, 21 U.S.C.section 360bbb-3(b)(1), unless the authorization is terminated  or revoked sooner.       Influenza A by PCR NEGATIVE NEGATIVE Final   Influenza B by PCR NEGATIVE NEGATIVE Final    Comment: (NOTE) The Xpert Xpress SARS-CoV-2/FLU/RSV plus assay is intended as an aid in the diagnosis of influenza from Nasopharyngeal swab specimens and should not be used as a sole basis for treatment. Nasal washings and aspirates are unacceptable for Xpert Xpress SARS-CoV-2/FLU/RSV testing.  Fact Sheet for Patients: EntrepreneurPulse.com.au  Fact Sheet for Healthcare Providers: IncredibleEmployment.be  This test is not yet approved or cleared by the [place] FDA and has been authorized for detection and/or diagnosis of SARS-CoV-2 by FDA under an Emergency Use Authorization (EUA). This EUA will remain in effect (meaning this test can be used) for the duration of the COVID-19 declaration under Section 564(b)(1) of the Act, 21 U.S.C. section 360bbb-3(b)(1), unless the authorization is terminated or revoked.  Performed at [facility], [address], [place], [place] [address]          Radiology Studies: DG Chest 2 View  Result Date: 03/30/2021 CLINICAL DATA:  Hypoxia.  History of upper GI bleed. EXAM: CHEST - 2 VIEW COMPARISON:  03/28/2021 FINDINGS: Stable prominent cardiopericardial silhouette likely due to chronic  pericardial effusion. Stable tortuosity and calcification of the thoracic aorta. Persistent left lower lobe process likely a combination of effusion, atelectasis  and/or infiltrate. IMPRESSION: Stable prominent cardiopericardial silhouette. Persistent left lower lobe process. Electronically Signed   By: Marijo Sanes M.D.   On: 03/30/2021 12:05   CT CHEST WO CONTRAST  Result Date: 03/30/2021 CLINICAL DATA:  Pneumonia EXAM: CT CHEST WITHOUT CONTRAST TECHNIQUE: Multidetector CT imaging of the chest was performed following the standard protocol without IV contrast. COMPARISON:  April 04, 2019 FINDINGS: Cardiovascular: Large pericardial effusion. Cardiomegaly. Atherosclerotic calcifications of the aorta. Aortic valve calcifications. Mediastinum/Nodes: Thyroid is unremarkable. No axillary adenopathy. Status post LEFT axillary node sampling. No definitive mediastinal adenopathy. Lungs/Pleura: Small to moderate LEFT pleural effusion. Trace RIGHT pleural effusion. Subpleural reticulation bilaterally. Anteriorly located subpleural reticulation of the LEFT breast likely reflecting sequela of radiation related changes. Irregular RIGHT apical pulmonary nodule versus scar measures 6 x 3 mm (series 7, image 27). Several adjacent RIGHT middle lobe pulmonary nodules each measure approximately 4 mm (series 7, image 104). LEFT lower lobe consolidative opacity, nonspecific. RIGHT basilar linear opacity most consistent with atelectasis. Upper Abdomen: No acute abnormality. Musculoskeletal: Postsurgical changes in the LEFT breast. IMPRESSION: 1. LEFT lower lobe consolidative opacity, nonspecific with differential considerations including infection or atelectasis. 2. Large pericardial effusion, similar in comparison to prior. 3. Small LEFT and trace RIGHT pleural effusions. Bilateral subpleural reticulation likely reflects a degree of underlying interstitial lung disease. 4. Scattered sub 5 mm pulmonary nodules, nonspecific. Recommend  follow-up chest CT in 1 year to assess for stability. Aortic Atherosclerosis (ICD10-I70.0). Electronically Signed   By: Valentino Saxon MD   On: 03/30/2021 16:11        Scheduled Meds:  atovaquone  750 mg Oral QHS   diltiazem  120 mg Oral Daily   fluticasone  1 spray Each Nare Daily   macitentan  10 mg Oral Daily   montelukast  10 mg Oral QHS   pantoprazole  40 mg Oral Daily   predniSONE  1 mg Oral 4 times per day   Continuous Infusions:     LOS: 2 days   Time spent: 35 mins Greater than 50% of this time was spent in counseling, explanation of diagnosis, planning of further management, and coordination of care.   Voice Recognition Viviann Spare dictation system was used to create this note, attempts have been made to correct errors. Please contact the author with questions and/or clarifications.   Florencia Reasons, MD PhD FACP [facility]  Available via Epic secure chat 7am-7pm for nonurgent issues Please page for urgent issues To page the attending provider between 7A-7P or the covering provider during after hours 7P-7A, please log into the web site [email or web address] and access using universal [place] password for that web site. If you do not have the password, please call the hospital operator.    03/31/2021, 12:10 PM

## 2021-04-01 ENCOUNTER — Encounter (HOSPITAL_COMMUNITY): Payer: Self-pay | Admitting: Internal Medicine

## 2021-04-01 LAB — TYPE AND SCREEN
ABO/RH(D): A POS
Antibody Screen: NEGATIVE
Unit division: 0
Unit division: 0
Unit division: 0

## 2021-04-01 LAB — CBC
HCT: 30 % — ABNORMAL LOW (ref 36.0–46.0)
Hemoglobin: 9.4 g/dL — ABNORMAL LOW (ref 12.0–15.0)
MCH: 32.3 pg (ref 26.0–34.0)
MCHC: 31.3 g/dL (ref 30.0–36.0)
MCV: 103.1 fL — ABNORMAL HIGH (ref 80.0–100.0)
Platelets: 225 10*3/uL (ref 150–400)
RBC: 2.91 MIL/uL — ABNORMAL LOW (ref 3.87–5.11)
RDW: 19.1 % — ABNORMAL HIGH (ref 11.5–15.5)
WBC: 7.7 10*3/uL (ref 4.0–10.5)
nRBC: 0 % (ref 0.0–0.2)

## 2021-04-01 LAB — BPAM RBC
Blood Product Expiration Date: 202206262359
Blood Product Expiration Date: 202207022359
Blood Product Expiration Date: 202207032359
ISSUE DATE / TIME: 202206092326
ISSUE DATE / TIME: 202206101742
Unit Type and Rh: 6200
Unit Type and Rh: 6200
Unit Type and Rh: 6200

## 2021-04-01 LAB — BASIC METABOLIC PANEL
Anion gap: 6 (ref 5–15)
BUN: 32 mg/dL — ABNORMAL HIGH (ref 8–23)
CO2: 29 mmol/L (ref 22–32)
Calcium: 8.6 mg/dL — ABNORMAL LOW (ref 8.9–10.3)
Chloride: 103 mmol/L (ref 98–111)
Creatinine, Ser: 1.16 mg/dL — ABNORMAL HIGH (ref 0.44–1.00)
GFR, Estimated: 47 mL/min — ABNORMAL LOW (ref >60)
Glucose, Bld: 100 mg/dL — ABNORMAL HIGH (ref 70–99)
Potassium: 4.1 mmol/L (ref 3.5–5.1)
Sodium: 138 mmol/L (ref 135–145)

## 2021-04-01 LAB — PROCALCITONIN: Procalcitonin: <0.1 ng/mL

## 2021-04-01 MED ORDER — DRY MOUTH MOUTHWASH MT LIQD: 5.0000 mL | Freq: Every day | OROMUCOSAL | Status: DC | PRN

## 2021-04-01 MED ORDER — PANTOPRAZOLE SODIUM 40 MG PO TBEC: 40.0000 mg | DELAYED_RELEASE_TABLET | Freq: Every day | ORAL | 1 refills | Status: DC

## 2021-04-01 NOTE — Discharge Summary (Signed)
Physician Discharge Summary  Paula Ward QQI:[id number] DOB: 19-Oct-1936 DOA: 03/28/2021  PCP: Hoyt Koch, MD  Admit date: 03/28/2021 Discharge date: 04/01/2021  Admitted From: Home Disposition:  Home  Discharge Condition:Stable CODE STATUS:FULL Diet recommendation: Heart Healthy   Brief/Interim Summary:     Paula Ward is a 85 y.o. female with medical history significant for upper gastrointestinal bleeding in the setting of angiodysplasia of the stomach/duodenum as well as gastrointestinal AVMs, chronic anemia associated baseline hemoglobin 8.5-9.5, chronic pericardial effusion,  stage IIIb chronic kidney disease with baseline creatinine 1.2-1.4, crest syndrome, pulmonary hypertension, interstitial lung disease , who is admitted to [facility] on 03/28/2021 from [place] emergency department for further evaluation management of dark stool and generalized weakness.  She received 2 units of PRBC during this hospitalization.  She underwent a small bowel enteroscopy by GI which showed duodenal and jejunal angiectasis and abated by APC.  No evidence of recurrent bleeding.  She has been started on Protonix daily.  She also qualified for home oxygen.  Patient is medically stable for discharge home today.   Following problems were addressed during her hospitalization:     Acute on chronic blood loss anemia -Present with dark color stool and weakness, FOBT positive -Received 2 units PRBC transfusion, with appropriate improvement of hemoglobin -s/p small bowel enteroscopy on 6/10,   She underwent a small bowel enteroscopy by GI which showed duodenal and jejunal angiectasis and abated by APC.  No evidence of recurrent bleeding.  She has been started on Protonix daily -Currently hemoglobin stable in the range of 9.   Acute hypoxic respiratory failure likely multifactorial,  including anemia ,  interstitial lung disease, pulmonary hypertension and chronic large  pericardioeffusion she dose has lung crackles which is chronic from ILD, she has  no edema, she has poor oral intake on Lasix prn and spironolactone daily at home She qualified for home oxygen   Interstitial lung disease/crest syndrome Reportedly , she has been on prednisone for the last few month, in the process of tapering prednisone with a plan to taper off in the next 1-2 months Continue PPI Left lower lobe opacity on imaging She has no fever, no leukocytosis, no new cough, procalcitonin less than 0.1 She follows with pulmonology. Qualified for home oxygen   Pulmonary hypertension Followed at [facility], on opsumit    Lung nodules: need to repeat ct chest in one year    Possible renal cell carcinoma , per recent [facility] record from 02/2021   CKD 3 A -Creatinine appears close to baseline -Monitor, renal dosing medication      Discharge Diagnoses:  Principal Problem:   Acute upper GI bleed Active Problems:   Essential hypertension   Acute on chronic anemia   Generalized weakness   Acute on chronic diastolic CHF (congestive heart failure) (HCC)   Elevated troponin   Intestinal angiodysplasia   GI bleed    Discharge Instructions  Discharge Instructions     Diet - low sodium heart healthy   Complete by: As directed    Discharge instructions   Complete by: As directed    1)Please follow up with your PCP in a week.Do a CBC test to check your hemoglobin during the follow up 2)Follow up with your gastroenterologist and pulmonologist as an outpatient.   Increase activity slowly   Complete by: As directed       Allergies as of 04/01/2021       Reactions   Codeine  Nausea Only   Hallucinations, too   Other Nausea And Vomiting   "-mycin" antibiotics.   Also cause hallucinations.   Erythromycin Nausea And Vomiting   Lisinopril    Pt doesn't remember    Mycophenolate Mofetil Nausea Only   Sulfa Antibiotics Other (See Comments)   unknown        Medication List      STOP taking these medications    omeprazole 40 MG capsule Commonly known as: PRILOSEC       TAKE these medications    Accu-Chek Aviva Plus test strip Generic drug: glucose blood   acetaminophen 325 MG tablet Commonly known as: TYLENOL Take 650 mg by mouth every 6 (six) hours as needed for mild pain.   albuterol 108 (90 Base) MCG/ACT inhaler Commonly known as: VENTOLIN HFA INHALE 2 PUFFS INTO THE LUNGS EVERY 6 HOURS AS NEEDED FOR WHEEZING OR SHORTNESS OF BREATH What changed:  how much to take how to take this when to take this reasons to take this additional instructions   ALPRAZolam 0.25 MG tablet Commonly known as: XANAX Take 0.5 tablets (0.125 mg total) by mouth at bedtime as needed. What changed: reasons to take this   atovaquone 750 MG/5ML suspension Commonly known as: MEPRON Take 750 mg by mouth at bedtime.   bacitracin ointment Apply 1 application topically 2 (two) times daily.   diltiazem 120 MG 24 hr capsule Commonly known as: CARDIZEM CD Take 120 mg by mouth daily.   Dry Mouth Mouthwash Liqd Use as directed 5 mLs in the mouth or throat daily as needed (dry mouth). Biotene   fluticasone 50 MCG/ACT nasal spray Commonly known as: FLONASE INSTILL 1 SPRAY INTO EACH NOSTRIL TWICE A DAY IF NEEDED What changed: See the new instructions.   furosemide 20 MG tablet Commonly known as: LASIX Take 1 tablet (20 mg total) by mouth daily. What changed: how much to take   lidocaine 2 % solution Commonly known as: XYLOCAINE Use as directed 5 mLs in the mouth or throat every 3 (three) hours as needed for mouth pain.   meclizine 12.5 MG tablet Commonly known as: ANTIVERT Take 1-2 tablets (12.5-25 mg total) by mouth 3 (three) times daily as needed for dizziness.   montelukast 10 MG tablet Commonly known as: SINGULAIR TAKE 1 TABLET(10 MG) BY MOUTH AT BEDTIME What changed: See the new instructions.   multivitamin with minerals Tabs tablet Take 1 tablet by  mouth daily. NO IRON   Opsumit 10 MG tablet Generic drug: macitentan Take 10 mg by mouth daily.   pantoprazole 40 MG tablet Commonly known as: PROTONIX Take 1 tablet (40 mg total) by mouth daily. What changed: See the new instructions.   predniSONE 1 MG tablet Commonly known as: DELTASONE Take 1 mg by mouth in the morning, at noon, in the evening, and at bedtime.   spironolactone 25 MG tablet Commonly known as: ALDACTONE Take 12.5 mg by mouth daily.   sucralfate 1 GM/10ML suspension Commonly known as: CARAFATE TAKE 10 MLS BY MOUTH AT BEDTIME What changed: See the new instructions.       ASK your doctor about these medications    mupirocin cream 2 % Commonly known as: Bactroban Apply 1 application topically 2 (two) times daily.   pilocarpine 5 MG tablet Commonly known as: SALAGEN TAKE 1 TABLET(5 MG) BY MOUTH THREE TIMES DAILY AS NEEDED               Durable Medical Equipment  (From  admission, onward)           Start     Ordered   04/01/21 1014  For home use only DME oxygen  Once       Comments: Home o2 at night and with activity Please provide POC for I 50.9  Question Answer Comment  Length of Need 6 Months   Mode or (Route) Nasal cannula   Liters per Minute 2   Frequency Only at night (stationary unit needed)   Oxygen conserving device Yes   Oxygen delivery system Gas      04/01/21 1014            Follow-up Information     Hoyt Koch, MD. Schedule an appointment as soon as possible for a visit in 1 week(s).   Specialty: Internal Medicine Contact information: [place] [address] [place] Follow up.   Why: This is your home oxygen company Contact information: [place] [address] [phone]                Allergies  Allergen Reactions   Codeine Nausea Only    Hallucinations, too   Other Nausea And Vomiting    "-mycin" antibiotics.    Also cause hallucinations.   Erythromycin Nausea And Vomiting   Lisinopril     Pt doesn't remember    Mycophenolate Mofetil Nausea Only   Sulfa Antibiotics Other (See Comments)    unknown       Procedures/Studies: DG Chest 2 View  Result Date: 03/30/2021 CLINICAL DATA:  Hypoxia.  History of upper GI bleed. EXAM: CHEST - 2 VIEW COMPARISON:  03/28/2021 FINDINGS: Stable prominent cardiopericardial silhouette likely due to chronic pericardial effusion. Stable tortuosity and calcification of the thoracic aorta. Persistent left lower lobe process likely a combination of effusion, atelectasis and/or infiltrate. IMPRESSION: Stable prominent cardiopericardial silhouette. Persistent left lower lobe process. Electronically Signed   By: Marijo Sanes M.D.   On: 03/30/2021 12:05   DG Chest 2 View  Result Date: 03/28/2021 CLINICAL DATA:  Weakness EXAM: CHEST - 2 VIEW COMPARISON:  March 22, 2021 FINDINGS: There is cardiomegaly with pulmonary venous hypertension. There is a degree of interstitial thickening without frank edema or consolidation. There is no appreciable adenopathy. There is aortic atherosclerosis. There are surgical clips in the left breast and left axillary regions. 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 IMPRESSION: Rather marked cardiomegaly with pulmonary vascular congestion. A degree of underlying pericardial effusion on the left cannot be excluded. No frank edema or consolidation. Suspect underlying chronic bronchitis given interstitial thickening. Postoperative change left breast. Aortic Atherosclerosis (ICD10-I70.0). Electronically Signed   By: Lowella Grip III M.D.   On: 03/28/2021 14:11   DG Chest 2 View  Result Date: 03/22/2021 CLINICAL DATA:  Pneumonia follow-up. EXAM: CHEST - 2 VIEW COMPARISON:  Chest x-ray dated Mar 05, 2021. FINDINGS: Stable markedly enlarged cardiopericardial silhouette related to the underlying large pericardial effusion. Chronic small left pleural effusion.  No consolidation or pneumothorax. No acute osseous abnormality. IMPRESSION: 1. Stable chronic changes.  No pneumonia. Electronically Signed   By: Titus Dubin M.D.   On: 03/22/2021 14:08   DG Chest 2 View  Result Date: 03/05/2021 CLINICAL DATA:  chest tightness, recent PNA, chronic large pericardial effusion EXAM: CHEST - 2 VIEW COMPARISON:  Feb 20, 2021 FINDINGS: Unchanged, markedly enlarged cardiac silhouette. There is diffuse interstitial opacities. Persistent  left lower lung airspace disease. Trace bilateral pleural effusions. No visible pneumothorax. Bones are unchanged. Postsurgical changes overlying the left breast and axilla. IMPRESSION: Unchanged markedly enlarged cardiac silhouette suggesting pericardial effusion, with mild pulmonary edema and trace pleural effusions. Persistent left lower lung airspace disease compatible with infection. Electronically Signed   By: Maurine Simmering   On: 03/05/2021 12:14   CT CHEST WO CONTRAST  Result Date: 03/30/2021 CLINICAL DATA:  Pneumonia EXAM: CT CHEST WITHOUT CONTRAST TECHNIQUE: Multidetector CT imaging of the chest was performed following the standard protocol without IV contrast. COMPARISON:  April 04, 2019 FINDINGS: Cardiovascular: Large pericardial effusion. Cardiomegaly. Atherosclerotic calcifications of the aorta. Aortic valve calcifications. Mediastinum/Nodes: Thyroid is unremarkable. No axillary adenopathy. Status post LEFT axillary node sampling. No definitive mediastinal adenopathy. Lungs/Pleura: Small to moderate LEFT pleural effusion. Trace RIGHT pleural effusion. Subpleural reticulation bilaterally. Anteriorly located subpleural reticulation of the LEFT breast likely reflecting sequela of radiation related changes. Irregular RIGHT apical pulmonary nodule versus scar measures 6 x 3 mm (series 7, image 27). Several adjacent RIGHT middle lobe pulmonary nodules each measure approximately 4 mm (series 7, image 104). LEFT lower lobe consolidative opacity,  nonspecific. RIGHT basilar linear opacity most consistent with atelectasis. Upper Abdomen: No acute abnormality. Musculoskeletal: Postsurgical changes in the LEFT breast. IMPRESSION: 1. LEFT lower lobe consolidative opacity, nonspecific with differential considerations including infection or atelectasis. 2. Large pericardial effusion, similar in comparison to prior. 3. Small LEFT and trace RIGHT pleural effusions. Bilateral subpleural reticulation likely reflects a degree of underlying interstitial lung disease. 4. Scattered sub 5 mm pulmonary nodules, nonspecific. Recommend follow-up chest CT in 1 year to assess for stability. Aortic Atherosclerosis (ICD10-I70.0). Electronically Signed   By: Valentino Saxon MD   On: 03/30/2021 16:11      Subjective: Patient seen and examined at the bedside this morning before discharge.  Hemodynamically stable.  Medically stable for discharge.  Discussed the discharge planning with the husband on phone.  Discharge Exam: Vitals:   04/01/21 0438 04/01/21 1013  BP: (!) 145/54 (!) 149/58  Pulse: 87   Resp: 20   Temp: 98.1 F (36.7 C)   SpO2: 92%    Vitals:   03/31/21 1928 04/01/21 0438 04/01/21 0500 04/01/21 1013  BP: (!) 118/55 (!) 145/54  (!) 149/58  Pulse: 86 87    Resp: 20 20    Temp: 98.5 F (36.9 C) 98.1 F (36.7 C)    TempSrc: Oral Oral    SpO2: 92% 92%    Weight:   49.6 kg   Height:        General: Pt is alert, awake, not in acute distress Cardiovascular: RRR, S1/S2 +, no rubs, no gallops Respiratory: Bilateral basal crackles Abdominal: Soft, NT, ND, bowel sounds + Extremities: no edema, no cyanosis    The results of significant diagnostics from this hospitalization (including imaging, microbiology, ancillary and laboratory) are listed below for reference.     Microbiology: Recent Results (from the past 240 hour(s))  Resp Panel by RT-PCR (Flu A&B, Covid) Nasopharyngeal Swab     Status: None   Collection Time: 03/28/21  3:10 PM    Specimen: Nasopharyngeal Swab; Nasopharyngeal(NP) swabs in vial transport medium  Result Value Ref Range Status   SARS Coronavirus 2 by RT PCR NEGATIVE NEGATIVE Final    Comment: (NOTE) SARS-CoV-2 target nucleic acids are NOT DETECTED.  The SARS-CoV-2 RNA is generally detectable in upper respiratory specimens during the acute phase of infection. The lowest concentration of SARS-CoV-2 viral  copies this assay can detect is 138 copies/mL. A negative result does not preclude SARS-Cov-2 infection and should not be used as the sole basis for treatment or other patient management decisions. A negative result may occur with  improper specimen collection/handling, submission of specimen other than nasopharyngeal swab, presence of viral mutation(s) within the areas targeted by this assay, and inadequate number of viral copies(<138 copies/mL). A negative result must be combined with clinical observations, patient history, and epidemiological information. The expected result is Negative.  Fact Sheet for Patients:  EntrepreneurPulse.com.au  Fact Sheet for Healthcare Providers:  IncredibleEmployment.be  This test is no t yet approved or cleared by the [place] FDA and  has been authorized for detection and/or diagnosis of SARS-CoV-2 by FDA under an Emergency Use Authorization (EUA). This EUA will remain  in effect (meaning this test can be used) for the duration of the COVID-19 declaration under Section 564(b)(1) of the Act, 21 U.S.C.section 360bbb-3(b)(1), unless the authorization is terminated  or revoked sooner.       Influenza A by PCR NEGATIVE NEGATIVE Final   Influenza B by PCR NEGATIVE NEGATIVE Final    Comment: (NOTE) The Xpert Xpress SARS-CoV-2/FLU/RSV plus assay is intended as an aid in the diagnosis of influenza from Nasopharyngeal swab specimens and should not be used as a sole basis for treatment. Nasal washings and aspirates are  unacceptable for Xpert Xpress SARS-CoV-2/FLU/RSV testing.  Fact Sheet for Patients: EntrepreneurPulse.com.au  Fact Sheet for Healthcare Providers: IncredibleEmployment.be  This test is not yet approved or cleared by the [place] FDA and has been authorized for detection and/or diagnosis of SARS-CoV-2 by FDA under an Emergency Use Authorization (EUA). This EUA will remain in effect (meaning this test can be used) for the duration of the COVID-19 declaration under Section 564(b)(1) of the Act, 21 U.S.C. section 360bbb-3(b)(1), unless the authorization is terminated or revoked.  Performed at [facility], [address] [place], [place] [address]      Labs: BNP (last 3 results) Recent Labs    03/28/21 1356  BNP 6,067.7*   Basic Metabolic Panel: Recent Labs  Lab 03/28/21 1356 03/28/21 2039 03/29/21 0534 03/30/21 0550 03/31/21 0839 04/01/21 0520  NA 134*  --  137 136 134* 138  K 4.2  --  4.2 4.8 4.3 4.1  CL 100  --  102 101 100 103  CO2 25  --  _0 GLUCOSE 103*  --  77 82 98 100*  BUN 28*  --  25* 36* 37* 32*  CREATININE 1.32*  --  1.10* 1.45* 1.31* 1.16*  CALCIUM 8.8*  --  8.6* 8.7* 8.7* 8.6*  MG  --  2.1 2.0  --   --   --   PHOS  --   --  4.5  --   --   --    Liver Function Tests: Recent Labs  Lab 03/28/21 1356 03/29/21 0534  AST 24 23  ALT 13 13  ALKPHOS 36* 31*  BILITOT 0.5 0.8  PROT 6.5 5.7*  ALBUMIN 4.0 3.5   No results for input(s): LIPASE, AMYLASE in the last 168 hours. No results for input(s): AMMONIA in the last 168 hours. CBC: Recent Labs  Lab 03/28/21 1356 03/28/21 2039 03/29/21 0534 03/30/21 0550 04/01/21 0520  WBC 7.6  --  5.3 10.5 7.7  NEUTROABS 6.1  --   --   --   --   HGB 7.1* 6.4* 7.6* 9.0* 9.4*  HCT 22.6* 21.1* 24.7* 28.9* 30.0*  MCV 109.2*  --  105.1* 102.8* 103.1*  PLT 324  --  146* 275 225   Cardiac Enzymes: No results for input(s): CKTOTAL, CKMB, CKMBINDEX,  TROPONINI in the last 168 hours. BNP: Invalid input(s): POCBNP CBG: No results for input(s): GLUCAP in the last 168 hours. D-Dimer Recent Labs    03/31/21 0533  DDIMER 1.62*   Hgb A1c No results for input(s): HGBA1C in the last 72 hours. Lipid Profile No results for input(s): CHOL, HDL, LDLCALC, TRIG, CHOLHDL, LDLDIRECT in the last 72 hours. Thyroid function studies No results for input(s): TSH, T4TOTAL, T3FREE, THYROIDAB in the last 72 hours.  Invalid input(s): FREET3 Anemia work up No results for input(s): VITAMINB12, FOLATE, FERRITIN, TIBC, IRON, RETICCTPCT in the last 72 hours. Urinalysis    Component Value Date/Time   COLORURINE YELLOW 03/30/2021 1416   APPEARANCEUR CLEAR 03/30/2021 1416   LABSPEC 1.013 03/30/2021 1416   PHURINE 5.0 03/30/2021 1416   GLUCOSEU NEGATIVE 03/30/2021 1416   GLUCOSEU NEGATIVE 01/10/2021 0958   HGBUR NEGATIVE 03/30/2021 1416   BILIRUBINUR NEGATIVE 03/30/2021 1416   BILIRUBINUR Neg 09/03/2017 1529   KETONESUR NEGATIVE 03/30/2021 1416   PROTEINUR NEGATIVE 03/30/2021 1416   UROBILINOGEN 0.2 01/10/2021 0958   NITRITE NEGATIVE 03/30/2021 1416   LEUKOCYTESUR NEGATIVE 03/30/2021 1416   Sepsis Labs Invalid input(s): PROCALCITONIN,  WBC,  LACTICIDVEN Microbiology Recent Results (from the past 240 hour(s))  Resp Panel by RT-PCR (Flu A&B, Covid) Nasopharyngeal Swab     Status: None   Collection Time: 03/28/21  3:10 PM   Specimen: Nasopharyngeal Swab; Nasopharyngeal(NP) swabs in vial transport medium  Result Value Ref Range Status   SARS Coronavirus 2 by RT PCR NEGATIVE NEGATIVE Final    Comment: (NOTE) SARS-CoV-2 target nucleic acids are NOT DETECTED.  The SARS-CoV-2 RNA is generally detectable in upper respiratory specimens during the acute phase of infection. The lowest concentration of SARS-CoV-2 viral copies this assay can detect is 138 copies/mL. A negative result does not preclude SARS-Cov-2 infection and should not be used as the sole  basis for treatment or other patient management decisions. A negative result may occur with  improper specimen collection/handling, submission of specimen other than nasopharyngeal swab, presence of viral mutation(s) within the areas targeted by this assay, and inadequate number of viral copies(<138 copies/mL). A negative result must be combined with clinical observations, patient history, and epidemiological information. The expected result is Negative.  Fact Sheet for Patients:  EntrepreneurPulse.com.au  Fact Sheet for Healthcare Providers:  IncredibleEmployment.be  This test is no t yet approved or cleared by the [place] FDA and  has been authorized for detection and/or diagnosis of SARS-CoV-2 by FDA under an Emergency Use Authorization (EUA). This EUA will remain  in effect (meaning this test can be used) for the duration of the COVID-19 declaration under Section 564(b)(1) of the Act, 21 U.S.C.section 360bbb-3(b)(1), unless the authorization is terminated  or revoked sooner.       Influenza A by PCR NEGATIVE NEGATIVE Final   Influenza B by PCR NEGATIVE NEGATIVE Final    Comment: (NOTE) The Xpert Xpress SARS-CoV-2/FLU/RSV plus assay is intended as an aid in the diagnosis of influenza from Nasopharyngeal swab specimens and should not be used as a sole basis for treatment. Nasal washings and aspirates are unacceptable for Xpert Xpress SARS-CoV-2/FLU/RSV testing.  Fact Sheet for Patients: EntrepreneurPulse.com.au  Fact Sheet for Healthcare Providers: IncredibleEmployment.be  This test is not yet approved or cleared by  the [employer] and has been authorized for detection and/or diagnosis of SARS-CoV-2 by FDA under an Emergency Use Authorization (EUA). This EUA will remain in effect (meaning this test can be used) for the duration of the COVID-19 declaration under Section 564(b)(1) of the Act,  21 U.S.C. section 360bbb-3(b)(1), unless the authorization is terminated or revoked.  Performed at [facility], [address] [place], [place] [address]     Please note: You were cared for by a hospitalist during your hospital stay. Once you are discharged, your primary care physician will handle any further medical issues. Please note that NO REFILLS for any discharge medications will be authorized once you are discharged, as it is imperative that you return to your primary care physician (or establish a relationship with a primary care physician if you do not have one) for your post hospital discharge needs so that they can reassess your need for medications and monitor your lab values.    Time coordinating discharge: 40 minutes  SIGNED:   Shelly Coss, MD  [facility] 04/01/2021, 1:32 PM Pager [phone]  If 7PM-7AM, please contact night-coverage [email or web address] Password [email or web address]

## 2021-04-01 NOTE — Progress Notes (Deleted)
Physician Discharge Summary  Paula Ward:[id number] DOB: 1936-10-20 DOA: 03/28/2021  PCP: Hoyt Koch, MD  Admit date: 03/28/2021 Discharge date: 04/01/2021  Admitted From: Home Disposition:  Home  Discharge Condition:Stable CODE STATUS:FULL Diet recommendation: Heart Healthy  Brief/Interim Summary:  Paula Ward is a 85 y.o. female with medical history significant for upper gastrointestinal bleeding in the setting of angiodysplasia of the stomach/duodenum as well as gastrointestinal AVMs, chronic anemia associated baseline hemoglobin 8.5-9.5, chronic pericardial effusion,  stage IIIb chronic kidney disease with baseline creatinine 1.2-1.4, crest syndrome, pulmonary hypertension, interstitial lung disease , who is admitted to [facility] on 03/28/2021 from [place] emergency department for further evaluation management of dark stool and generalized weakness.  She received 2 units of PRBC during this hospitalization.  She underwent a small bowel enteroscopy by GI which showed duodenal and jejunal angiectasis and abated by APC.  No evidence of recurrent bleeding.  She has been started on Protonix daily.  She also qualified for home oxygen.  Patient is medically stable for discharge home today.  Following problems were addressed during her hospitalization:   Acute on chronic blood loss anemia -Present with dark color stool and weakness, FOBT positive -Received 2 units PRBC transfusion, with appropriate improvement of hemoglobin -s/p small bowel enteroscopy on 6/10,   She underwent a small bowel enteroscopy by GI which showed duodenal and jejunal angiectasis and abated by APC.  No evidence of recurrent bleeding.  She has been started on Protonix daily -Currently hemoglobin stable in the range of 9.   Acute hypoxic respiratory failure likely multifactorial,  including anemia ,  interstitial lung disease, pulmonary hypertension and chronic large  pericardioeffusion she dose has lung crackles which is chronic from ILD, she has  no edema, she has poor oral intake on Lasix prn and spironolactone daily at home She qualified for home oxygen  Interstitial lung disease/crest syndrome Reportedly , she has been on prednisone for the last few month, in the process of tapering prednisone with a plan to taper off in the next 1-2 months Continue PPI Left lower lobe opacity on imaging She has no fever, no leukocytosis, no new cough, procalcitonin less than 0.1 She follows with pulmonology. Qualified for home oxygen   Pulmonary hypertension Followed at [facility], on opsumit    Lung nodules: need to repeat ct chest in one year    Possible renal cell carcinoma , per recent [facility] record from 02/2021   CKD 3 A -Creatinine appears close to baseline -Monitor, renal dosing medication     Discharge Diagnoses:  Principal Problem:   Acute upper GI bleed Active Problems:   Essential hypertension   Acute on chronic anemia   Generalized weakness   Acute on chronic diastolic CHF (congestive heart failure) (HCC)   Elevated troponin   Intestinal angiodysplasia   GI bleed    Discharge Instructions  Discharge Instructions     Diet - low sodium heart healthy   Complete by: As directed    Discharge instructions   Complete by: As directed    1)Please follow up with your PCP in a week.Do a CBC test to check your hemoglobin during the follow up 2)Follow up with your gastroenterologist and pulmonologist as an outpatient.   Increase activity slowly   Complete by: As directed       Allergies as of 04/01/2021       Reactions   Codeine Nausea Only   Hallucinations, too   Other  Nausea And Vomiting   "-mycin" antibiotics.   Also cause hallucinations.   Erythromycin Nausea And Vomiting   Lisinopril    Pt doesn't remember    Mycophenolate Mofetil Nausea Only   Sulfa Antibiotics Other (See Comments)   unknown        Medication List      STOP taking these medications    omeprazole 40 MG capsule Commonly known as: PRILOSEC       TAKE these medications    Accu-Chek Aviva Plus test strip Generic drug: glucose blood   acetaminophen 325 MG tablet Commonly known as: TYLENOL Take 650 mg by mouth every 6 (six) hours as needed for mild pain.   albuterol 108 (90 Base) MCG/ACT inhaler Commonly known as: VENTOLIN HFA INHALE 2 PUFFS INTO THE LUNGS EVERY 6 HOURS AS NEEDED FOR WHEEZING OR SHORTNESS OF BREATH What changed:  how much to take how to take this when to take this reasons to take this additional instructions   ALPRAZolam 0.25 MG tablet Commonly known as: XANAX Take 0.5 tablets (0.125 mg total) by mouth at bedtime as needed. What changed: reasons to take this   atovaquone 750 MG/5ML suspension Commonly known as: MEPRON Take 750 mg by mouth at bedtime.   bacitracin ointment Apply 1 application topically 2 (two) times daily.   diltiazem 120 MG 24 hr capsule Commonly known as: CARDIZEM CD Take 120 mg by mouth daily.   Dry Mouth Mouthwash Liqd Use as directed 5 mLs in the mouth or throat daily as needed (dry mouth). Biotene   fluticasone 50 MCG/ACT nasal spray Commonly known as: FLONASE INSTILL 1 SPRAY INTO EACH NOSTRIL TWICE A DAY IF NEEDED What changed: See the new instructions.   furosemide 20 MG tablet Commonly known as: LASIX Take 1 tablet (20 mg total) by mouth daily. What changed: how much to take   lidocaine 2 % solution Commonly known as: XYLOCAINE Use as directed 5 mLs in the mouth or throat every 3 (three) hours as needed for mouth pain.   meclizine 12.5 MG tablet Commonly known as: ANTIVERT Take 1-2 tablets (12.5-25 mg total) by mouth 3 (three) times daily as needed for dizziness.   montelukast 10 MG tablet Commonly known as: SINGULAIR TAKE 1 TABLET(10 MG) BY MOUTH AT BEDTIME What changed: See the new instructions.   multivitamin with minerals Tabs tablet Take 1 tablet by  mouth daily. NO IRON   Opsumit 10 MG tablet Generic drug: macitentan Take 10 mg by mouth daily.   pantoprazole 40 MG tablet Commonly known as: PROTONIX Take 1 tablet (40 mg total) by mouth daily. What changed: See the new instructions.   predniSONE 1 MG tablet Commonly known as: DELTASONE Take 1 mg by mouth in the morning, at noon, in the evening, and at bedtime.   spironolactone 25 MG tablet Commonly known as: ALDACTONE Take 12.5 mg by mouth daily.   sucralfate 1 GM/10ML suspension Commonly known as: CARAFATE TAKE 10 MLS BY MOUTH AT BEDTIME What changed: See the new instructions.       ASK your doctor about these medications    mupirocin cream 2 % Commonly known as: Bactroban Apply 1 application topically 2 (two) times daily.   pilocarpine 5 MG tablet Commonly known as: SALAGEN TAKE 1 TABLET(5 MG) BY MOUTH THREE TIMES DAILY AS NEEDED               Durable Medical Equipment  (From admission, onward)  Start     Ordered   03/31/21 1237  For home use only DME oxygen  Once       Comments: Home o2 at night and with activity  Question Answer Comment  Length of Need 6 Months   Mode or (Route) Nasal cannula   Liters per Minute 2   Frequency Only at night (stationary unit needed)   Oxygen conserving device Yes   Oxygen delivery system Gas      03/31/21 1237            Follow-up Information     Hoyt Koch, MD. Schedule an appointment as soon as possible for a visit in 1 week(s).   Specialty: Internal Medicine Contact information: [place] [address] [phone]                Allergies  Allergen Reactions   Codeine Nausea Only    Hallucinations, too   Other Nausea And Vomiting    "-mycin" antibiotics.   Also cause hallucinations.   Erythromycin Nausea And Vomiting   Lisinopril     Pt doesn't remember    Mycophenolate Mofetil Nausea Only   Sulfa Antibiotics Other (See Comments)    unknown     Consultations: GI   Procedures/Studies: DG Chest 2 View  Result Date: 03/30/2021 CLINICAL DATA:  Hypoxia.  History of upper GI bleed. EXAM: CHEST - 2 VIEW COMPARISON:  03/28/2021 FINDINGS: Stable prominent cardiopericardial silhouette likely due to chronic pericardial effusion. Stable tortuosity and calcification of the thoracic aorta. Persistent left lower lobe process likely a combination of effusion, atelectasis and/or infiltrate. IMPRESSION: Stable prominent cardiopericardial silhouette. Persistent left lower lobe process. Electronically Signed   By: Marijo Sanes M.D.   On: 03/30/2021 12:05   DG Chest 2 View  Result Date: 03/28/2021 CLINICAL DATA:  Weakness EXAM: CHEST - 2 VIEW COMPARISON:  March 22, 2021 FINDINGS: There is cardiomegaly with pulmonary venous hypertension. There is a degree of interstitial thickening without frank edema or consolidation. There is no appreciable adenopathy. There is aortic atherosclerosis. There are surgical clips in the left breast and left axillary regions. 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 IMPRESSION: Rather marked cardiomegaly with pulmonary vascular congestion. A degree of underlying pericardial effusion on the left cannot be excluded. No frank edema or consolidation. Suspect underlying chronic bronchitis given interstitial thickening. Postoperative change left breast. Aortic Atherosclerosis (ICD10-I70.0). Electronically Signed   By: Lowella Grip III M.D.   On: 03/28/2021 14:11   DG Chest 2 View  Result Date: 03/22/2021 CLINICAL DATA:  Pneumonia follow-up. EXAM: CHEST - 2 VIEW COMPARISON:  Chest x-ray dated Mar 05, 2021. FINDINGS: Stable markedly enlarged cardiopericardial silhouette related to the underlying large pericardial effusion. Chronic small left pleural effusion. No consolidation or pneumothorax. No acute osseous abnormality. IMPRESSION: 1. Stable chronic changes.  No pneumonia. Electronically Signed   By: Titus Dubin M.D.   On:  03/22/2021 14:08   DG Chest 2 View  Result Date: 03/05/2021 CLINICAL DATA:  chest tightness, recent PNA, chronic large pericardial effusion EXAM: CHEST - 2 VIEW COMPARISON:  Feb 20, 2021 FINDINGS: Unchanged, markedly enlarged cardiac silhouette. There is diffuse interstitial opacities. Persistent left lower lung airspace disease. Trace bilateral pleural effusions. No visible pneumothorax. Bones are unchanged. Postsurgical changes overlying the left breast and axilla. IMPRESSION: Unchanged markedly enlarged cardiac silhouette suggesting pericardial effusion, with mild pulmonary edema and trace pleural effusions. Persistent left lower lung airspace disease compatible with infection. Electronically Signed  By: Maurine Simmering   On: 03/05/2021 12:14   CT CHEST WO CONTRAST  Result Date: 03/30/2021 CLINICAL DATA:  Pneumonia EXAM: CT CHEST WITHOUT CONTRAST TECHNIQUE: Multidetector CT imaging of the chest was performed following the standard protocol without IV contrast. COMPARISON:  April 04, 2019 FINDINGS: Cardiovascular: Large pericardial effusion. Cardiomegaly. Atherosclerotic calcifications of the aorta. Aortic valve calcifications. Mediastinum/Nodes: Thyroid is unremarkable. No axillary adenopathy. Status post LEFT axillary node sampling. No definitive mediastinal adenopathy. Lungs/Pleura: Small to moderate LEFT pleural effusion. Trace RIGHT pleural effusion. Subpleural reticulation bilaterally. Anteriorly located subpleural reticulation of the LEFT breast likely reflecting sequela of radiation related changes. Irregular RIGHT apical pulmonary nodule versus scar measures 6 x 3 mm (series 7, image 27). Several adjacent RIGHT middle lobe pulmonary nodules each measure approximately 4 mm (series 7, image 104). LEFT lower lobe consolidative opacity, nonspecific. RIGHT basilar linear opacity most consistent with atelectasis. Upper Abdomen: No acute abnormality. Musculoskeletal: Postsurgical changes in the LEFT breast.  IMPRESSION: 1. LEFT lower lobe consolidative opacity, nonspecific with differential considerations including infection or atelectasis. 2. Large pericardial effusion, similar in comparison to prior. 3. Small LEFT and trace RIGHT pleural effusions. Bilateral subpleural reticulation likely reflects a degree of underlying interstitial lung disease. 4. Scattered sub 5 mm pulmonary nodules, nonspecific. Recommend follow-up chest CT in 1 year to assess for stability. Aortic Atherosclerosis (ICD10-I70.0). Electronically Signed   By: Valentino Saxon MD   On: 03/30/2021 16:11      Subjective:  Patient seen and examined the bedside this morning.  Hemodynamically stable.  Medically stable for discharge.I called and discussed with husband about  discharge planning   Discharge Exam: Vitals:   03/31/21 1928 04/01/21 0438  BP: (!) 118/55 (!) 145/54  Pulse: 86 87  Resp: 20 20  Temp: 98.5 F (36.9 C) 98.1 F (36.7 C)  SpO2: 92% 92%   Vitals:   03/31/21 1416 03/31/21 1928 04/01/21 0438 04/01/21 0500  BP: (!) 123/48 (!) 118/55 (!) 145/54   Pulse: 88 86 87   Resp: _0 Temp: 98.1 F (36.7 C) 98.5 F (36.9 C) 98.1 F (36.7 C)   TempSrc: Oral Oral Oral   SpO2: 92% 92% 92%   Weight:    49.6 kg  Height:        General: Pt is alert, awake, not in acute distress Cardiovascular: RRR, S1/S2 +, no rubs, no gallops Respiratory: CTA bilaterally, no wheezing, no rhonchi Abdominal: Soft, NT, ND, bowel sounds + Extremities: no edema, no cyanosis    The results of significant diagnostics from this hospitalization (including imaging, microbiology, ancillary and laboratory) are listed below for reference.     Microbiology: Recent Results (from the past 240 hour(s))  Resp Panel by RT-PCR (Flu A&B, Covid) Nasopharyngeal Swab     Status: None   Collection Time: 03/28/21  3:10 PM   Specimen: Nasopharyngeal Swab; Nasopharyngeal(NP) swabs in vial transport medium  Result Value Ref Range Status    SARS Coronavirus 2 by RT PCR NEGATIVE NEGATIVE Final    Comment: (NOTE) SARS-CoV-2 target nucleic acids are NOT DETECTED.  The SARS-CoV-2 RNA is generally detectable in upper respiratory specimens during the acute phase of infection. The lowest concentration of SARS-CoV-2 viral copies this assay can detect is 138 copies/mL. A negative result does not preclude SARS-Cov-2 infection and should not be used as the sole basis for treatment or other patient management decisions. A negative result may occur with  improper specimen collection/handling, submission of specimen other  than nasopharyngeal swab, presence of viral mutation(s) within the areas targeted by this assay, and inadequate number of viral copies(<138 copies/mL). A negative result must be combined with clinical observations, patient history, and epidemiological information. The expected result is Negative.  Fact Sheet for Patients:  EntrepreneurPulse.com.au  Fact Sheet for Healthcare Providers:  IncredibleEmployment.be  This test is no t yet approved or cleared by the [place] FDA and  has been authorized for detection and/or diagnosis of SARS-CoV-2 by FDA under an Emergency Use Authorization (EUA). This EUA will remain  in effect (meaning this test can be used) for the duration of the COVID-19 declaration under Section 564(b)(1) of the Act, 21 U.S.C.section 360bbb-3(b)(1), unless the authorization is terminated  or revoked sooner.       Influenza A by PCR NEGATIVE NEGATIVE Final   Influenza B by PCR NEGATIVE NEGATIVE Final    Comment: (NOTE) The Xpert Xpress SARS-CoV-2/FLU/RSV plus assay is intended as an aid in the diagnosis of influenza from Nasopharyngeal swab specimens and should not be used as a sole basis for treatment. Nasal washings and aspirates are unacceptable for Xpert Xpress SARS-CoV-2/FLU/RSV testing.  Fact Sheet for  Patients: EntrepreneurPulse.com.au  Fact Sheet for Healthcare Providers: IncredibleEmployment.be  This test is not yet approved or cleared by the [place] FDA and has been authorized for detection and/or diagnosis of SARS-CoV-2 by FDA under an Emergency Use Authorization (EUA). This EUA will remain in effect (meaning this test can be used) for the duration of the COVID-19 declaration under Section 564(b)(1) of the Act, 21 U.S.C. section 360bbb-3(b)(1), unless the authorization is terminated or revoked.  Performed at [facility], [address] [place], [place] [address]      Labs: BNP (last 3 results) Recent Labs    03/28/21 1356  BNP 0,321.2*   Basic Metabolic Panel: Recent Labs  Lab 03/28/21 1356 03/28/21 2039 03/29/21 0534 03/30/21 0550 03/31/21 0839 04/01/21 0520  NA 134*  --  137 136 134* 138  K 4.2  --  4.2 4.8 4.3 4.1  CL 100  --  102 101 100 103  CO2 25  --  _0 GLUCOSE 103*  --  77 82 98 100*  BUN 28*  --  25* 36* 37* 32*  CREATININE 1.32*  --  1.10* 1.45* 1.31* 1.16*  CALCIUM 8.8*  --  8.6* 8.7* 8.7* 8.6*  MG  --  2.1 2.0  --   --   --   PHOS  --   --  4.5  --   --   --    Liver Function Tests: Recent Labs  Lab 03/28/21 1356 03/29/21 0534  AST 24 23  ALT 13 13  ALKPHOS 36* 31*  BILITOT 0.5 0.8  PROT 6.5 5.7*  ALBUMIN 4.0 3.5   No results for input(s): LIPASE, AMYLASE in the last 168 hours. No results for input(s): AMMONIA in the last 168 hours. CBC: Recent Labs  Lab 03/28/21 1356 03/28/21 2039 03/29/21 0534 03/30/21 0550 04/01/21 0520  WBC 7.6  --  5.3 10.5 7.7  NEUTROABS 6.1  --   --   --   --   HGB 7.1* 6.4* 7.6* 9.0* 9.4*  HCT 22.6* 21.1* 24.7* 28.9* 30.0*  MCV 109.2*  --  105.1* 102.8* 103.1*  PLT 324  --  146* 275 225   Cardiac Enzymes: No results for input(s): CKTOTAL, CKMB, CKMBINDEX, TROPONINI in the last 168 hours. BNP: Invalid input(s): POCBNP  CBG: No  results for input(s): GLUCAP in the last 168 hours. D-Dimer Recent Labs    03/31/21 0533  DDIMER 1.62*   Hgb A1c No results for input(s): HGBA1C in the last 72 hours. Lipid Profile No results for input(s): CHOL, HDL, LDLCALC, TRIG, CHOLHDL, LDLDIRECT in the last 72 hours. Thyroid function studies No results for input(s): TSH, T4TOTAL, T3FREE, THYROIDAB in the last 72 hours.  Invalid input(s): FREET3 Anemia work up No results for input(s): VITAMINB12, FOLATE, FERRITIN, TIBC, IRON, RETICCTPCT in the last 72 hours. Urinalysis    Component Value Date/Time   COLORURINE YELLOW 03/30/2021 1416   APPEARANCEUR CLEAR 03/30/2021 1416   LABSPEC 1.013 03/30/2021 1416   PHURINE 5.0 03/30/2021 1416   GLUCOSEU NEGATIVE 03/30/2021 1416   GLUCOSEU NEGATIVE 01/10/2021 0958   HGBUR NEGATIVE 03/30/2021 1416   BILIRUBINUR NEGATIVE 03/30/2021 1416   BILIRUBINUR Neg 09/03/2017 1529   KETONESUR NEGATIVE 03/30/2021 1416   PROTEINUR NEGATIVE 03/30/2021 1416   UROBILINOGEN 0.2 01/10/2021 0958   NITRITE NEGATIVE 03/30/2021 1416   LEUKOCYTESUR NEGATIVE 03/30/2021 1416   Sepsis Labs Invalid input(s): PROCALCITONIN,  WBC,  LACTICIDVEN Microbiology Recent Results (from the past 240 hour(s))  Resp Panel by RT-PCR (Flu A&B, Covid) Nasopharyngeal Swab     Status: None   Collection Time: 03/28/21  3:10 PM   Specimen: Nasopharyngeal Swab; Nasopharyngeal(NP) swabs in vial transport medium  Result Value Ref Range Status   SARS Coronavirus 2 by RT PCR NEGATIVE NEGATIVE Final    Comment: (NOTE) SARS-CoV-2 target nucleic acids are NOT DETECTED.  The SARS-CoV-2 RNA is generally detectable in upper respiratory specimens during the acute phase of infection. The lowest concentration of SARS-CoV-2 viral copies this assay can detect is 138 copies/mL. A negative result does not preclude SARS-Cov-2 infection and should not be used as the sole basis for treatment or other patient management decisions. A negative  result may occur with  improper specimen collection/handling, submission of specimen other than nasopharyngeal swab, presence of viral mutation(s) within the areas targeted by this assay, and inadequate number of viral copies(<138 copies/mL). A negative result must be combined with clinical observations, patient history, and epidemiological information. The expected result is Negative.  Fact Sheet for Patients:  EntrepreneurPulse.com.au  Fact Sheet for Healthcare Providers:  IncredibleEmployment.be  This test is no t yet approved or cleared by the [place] FDA and  has been authorized for detection and/or diagnosis of SARS-CoV-2 by FDA under an Emergency Use Authorization (EUA). This EUA will remain  in effect (meaning this test can be used) for the duration of the COVID-19 declaration under Section 564(b)(1) of the Act, 21 U.S.C.section 360bbb-3(b)(1), unless the authorization is terminated  or revoked sooner.       Influenza A by PCR NEGATIVE NEGATIVE Final   Influenza B by PCR NEGATIVE NEGATIVE Final    Comment: (NOTE) The Xpert Xpress SARS-CoV-2/FLU/RSV plus assay is intended as an aid in the diagnosis of influenza from Nasopharyngeal swab specimens and should not be used as a sole basis for treatment. Nasal washings and aspirates are unacceptable for Xpert Xpress SARS-CoV-2/FLU/RSV testing.  Fact Sheet for Patients: EntrepreneurPulse.com.au  Fact Sheet for Healthcare Providers: IncredibleEmployment.be  This test is not yet approved or cleared by the [place] FDA and has been authorized for detection and/or diagnosis of SARS-CoV-2 by FDA under an Emergency Use Authorization (EUA). This EUA will remain in effect (meaning this test can be used) for the duration of the COVID-19 declaration under Section 564(b)(1) of  the Act, 21 U.S.C. section 360bbb-3(b)(1), unless the authorization is  terminated or revoked.  Performed at [facility], [address] [place], [place] [address]     Please note: You were cared for by a hospitalist during your hospital stay. Once you are discharged, your primary care physician will handle any further medical issues. Please note that NO REFILLS for any discharge medications will be authorized once you are discharged, as it is imperative that you return to your primary care physician (or establish a relationship with a primary care physician if you do not have one) for your post hospital discharge needs so that they can reassess your need for medications and monitor your lab values.    Time coordinating discharge: 40 minutes  SIGNED:   Shelly Coss, MD  [facility] 04/01/2021, 9:46 AM Pager [phone]  If 7PM-7AM, please contact night-coverage [email or web address] Password [email or web address]

## 2021-04-01 NOTE — Telephone Encounter (Signed)
Left message on machine to call back if the pt continues to have complaints.  I have been unable to reach the pt by phone.

## 2021-04-01 NOTE — TOC Transition Note (Signed)
Transition of Care [facility]) - CM/SW Discharge Note   Patient Details  Name: KAMPBELL HOLAWAY MRN: [id number] Date of Birth: 1936-06-24  Transition of Care [facility] CM/SW Contact:  Trish Mage, LCSW Phone Number: 04/01/2021, 9:58 AM   Clinical Narrative:   Patient who is stable for d/c today has a ride home, is in need of home O2.  Ms Tarkowski confirms she has not been on O2 for 15 years, no preference of supplier.  SAT note and orders seen and appreciated.  Called [employer] and Caryl Pina will arrange for home set up, travel cannister. Ms Fonte states she has all needed DME, and has no need for [facility] PT. No further needs identified.  TOC sign off.    Final next level of care: Home/Self Care Barriers to Discharge: No Barriers Identified   Patient Goals and CMS Choice        Discharge Placement                       Discharge Plan and Services                                     Social Determinants of Health (SDOH) Interventions     Readmission Risk Interventions No flowsheet data found.

## 2021-04-03 ENCOUNTER — Telehealth: Payer: Self-pay

## 2021-04-03 MED ORDER — FUROSEMIDE 20 MG PO TABS: 20.0000 mg | ORAL_TABLET | Freq: Every day | ORAL | 0 refills | Status: DC

## 2021-04-03 NOTE — Telephone Encounter (Cosign Needed)
Transition Care Management Follow-up Telephone Call Date of discharge and from where: 04/01/2021 from [facility] How have you been since you were released from the hospital? Feeling better; "I lost a lot of blood" Any questions or concerns? No  Items Reviewed: Did the pt receive and understand the discharge instructions provided? Yes  Medications obtained and verified? Yes , did not get rx for lasix 20 mg Other? No  Any new allergies since your discharge? No  Dietary orders reviewed? Yes, low sodium heart healthy diet Do you have support at home? Yes   Home Care and Equipment/Supplies: Were home health services ordered? no If so, what is the name of the agency? N/a  Has the agency set up a time to come to the patient's home? no Were any new equipment or medical supplies ordered?  Yes: Oxygen (on 2L/min)  What is the name of the medical supply agency? N/a Were you able to get the supplies/equipment? yes Do you have any questions related to the use of the equipment or supplies? No  Functional Questionnaire: (I = Independent and D = Dependent) ADLs: i  Bathing/Dressing- i  Meal Prep- i  Eating- i  Maintaining continence- i  Transferring/Ambulation- i  Managing Meds- i  Follow up appointments reviewed:  PCP Hospital f/u appt confirmed? Yes  Scheduled to see Pricilla Holm, MD on 04/09/2021 @ 1:00 pm. [facility] f/u appt confirmed? No  Need to f/u with Gastro and Pulmo. Are transportation arrangements needed? No  If their condition worsens, is the pt aware to call PCP or go to the Emergency Dept.? Yes Was the patient provided with contact information for the PCP's office or ED? Yes Was to pt encouraged to call back with questions or concerns? Yes

## 2021-04-03 NOTE — Telephone Encounter (Signed)
Sent in

## 2021-04-04 LAB — METHYLMALONIC ACID, SERUM: Methylmalonic Acid, Quantitative: 280 nmol/L (ref 0–378)

## 2021-04-09 ENCOUNTER — Other Ambulatory Visit: Payer: Self-pay | Admitting: Internal Medicine

## 2021-04-09 ENCOUNTER — Encounter: Payer: Self-pay | Admitting: Internal Medicine

## 2021-04-09 ENCOUNTER — Ambulatory Visit (INDEPENDENT_AMBULATORY_CARE_PROVIDER_SITE_OTHER): Payer: Medicare PPO | Admitting: Internal Medicine

## 2021-04-09 ENCOUNTER — Other Ambulatory Visit: Payer: Self-pay

## 2021-04-09 VITALS — BP 126/70 | HR 70 | Temp 98.0°F | Ht 63.0 in | Wt 107.4 lb

## 2021-04-09 DIAGNOSIS — D649 Anemia, unspecified: Secondary | ICD-10-CM

## 2021-04-09 DIAGNOSIS — I5033 Acute on chronic diastolic (congestive) heart failure: Secondary | ICD-10-CM

## 2021-04-09 LAB — CBC
HCT: 25.1 % — ABNORMAL LOW (ref 36.0–46.0)
Hemoglobin: 8.4 g/dL — ABNORMAL LOW (ref 12.0–15.0)
MCHC: 33.3 g/dL (ref 30.0–36.0)
MCV: 98 fl (ref 78.0–100.0)
Platelets: 263 10*3/uL (ref 150.0–400.0)
RBC: 2.56 Mil/uL — ABNORMAL LOW (ref 3.87–5.11)
RDW: 19 % — ABNORMAL HIGH (ref 11.5–15.5)
WBC: 7.4 10*3/uL (ref 4.0–10.5)

## 2021-04-09 LAB — COMPREHENSIVE METABOLIC PANEL
ALT: 12 U/L (ref 0–35)
AST: 20 U/L (ref 0–37)
Albumin: 4.3 g/dL (ref 3.5–5.2)
Alkaline Phosphatase: 31 U/L — ABNORMAL LOW (ref 39–117)
BUN: 43 mg/dL — ABNORMAL HIGH (ref 6–23)
CO2: 29 mEq/L (ref 19–32)
Calcium: 9.6 mg/dL (ref 8.4–10.5)
Chloride: 99 mEq/L (ref 96–112)
Creatinine, Ser: 1.48 mg/dL — ABNORMAL HIGH (ref 0.40–1.20)
GFR: 32.75 mL/min — ABNORMAL LOW (ref >60.00)
Glucose, Bld: 101 mg/dL — ABNORMAL HIGH (ref 70–99)
Potassium: 4 mEq/L (ref 3.5–5.1)
Sodium: 137 mEq/L (ref 135–145)
Total Bilirubin: 0.5 mg/dL (ref 0.2–1.2)
Total Protein: 6.5 g/dL (ref 6.0–8.3)

## 2021-04-09 NOTE — Assessment & Plan Note (Signed)
No signs of acute exacerbation today. Volume status euvolemic.

## 2021-04-09 NOTE — Patient Instructions (Signed)
We will check the blood counts today.

## 2021-04-09 NOTE — Assessment & Plan Note (Signed)
No further dark stools. Recheck CBC and CMP.

## 2021-04-09 NOTE — Progress Notes (Signed)
Subjective:   Patient ID: Paula Ward, female    DOB: [birth year], 85 y.o.   MRN: [id number]  HPI The patient is an 85 YO female coming in for hospital follow up (in for acute dark stools and anemia with 2 units PRBC, had enteroscopy and several vascular malformations were ablated). She has not had dark stools since leaving hospital. Still tired. Little appetite. Is using portable oxygen and machine feels heavy and does not work well. Denies chest pains. Denies SOB but can feel weak when oxygen level low. Denies diarrhea or constipation.   PMH, [facility], social history reviewed and updated  Review of Systems  Constitutional:  Positive for activity change and fatigue.  HENT: Negative.    Eyes: Negative.   Respiratory:  Negative for cough, chest tightness and shortness of breath.   Cardiovascular:  Negative for chest pain, palpitations and leg swelling.  Gastrointestinal:  Negative for abdominal distention, abdominal pain, constipation, diarrhea, nausea and vomiting.  Musculoskeletal: Negative.   Skin: Negative.   Neurological: Negative.   Psychiatric/Behavioral: Negative.     Objective:  Physical Exam Constitutional:      Appearance: She is well-developed.  HENT:     Head: Normocephalic and atraumatic.  Cardiovascular:     Rate and Rhythm: Normal rate and regular rhythm.     Comments: Portable oxygen Pulmonary:     Effort: Pulmonary effort is normal. No respiratory distress.     Breath sounds: Normal breath sounds. No wheezing or rales.  Abdominal:     General: Bowel sounds are normal. There is no distension.     Palpations: Abdomen is soft.     Tenderness: There is no abdominal tenderness. There is no rebound.  Musculoskeletal:     Cervical back: Normal range of motion.  Skin:    General: Skin is warm and dry.  Neurological:     Mental Status: She is alert and oriented to person, place, and time.     Coordination: Coordination normal.    Vitals:   04/09/21 1306  BP:  126/70  Pulse: 70  Temp: 98 F (36.7 C)  TempSrc: Rectal  SpO2: 98%  Weight: 107 lb 6.4 oz (48.7 kg)  Height: 5' 3" (1.6 m)    This visit occurred during the SARS-CoV-2 public health emergency.  Safety protocols were in place, including screening questions prior to the visit, additional usage of staff PPE, and extensive cleaning of exam room while observing appropriate contact time as indicated for disinfecting solutions.   Assessment & Plan:

## 2021-04-10 ENCOUNTER — Telehealth: Payer: Self-pay | Admitting: Internal Medicine

## 2021-04-10 NOTE — Telephone Encounter (Signed)
Patient called and said that she was in the hospital last week for losing blood. She said that she had a bowel movement last night and it was darker than normal. Patient said that she will call the GI doctor that she seen in the hospital. Please advise

## 2021-04-10 NOTE — Telephone Encounter (Signed)
Noted.

## 2021-04-11 NOTE — Telephone Encounter (Signed)
Inbound call from patient requesting a call back please.  Is really concerned about bowel movements.

## 2021-04-11 NOTE — Telephone Encounter (Signed)
Patient called back and states she saw her PCP on Tues. 04/09/21 and her Hbg. = 8.4 and she is still having dark stools. Her PCP told her to call us. Please advise

## 2021-04-11 NOTE — Telephone Encounter (Signed)
Called patient back, continue to play phone tag. Left message to please call back

## 2021-04-12 ENCOUNTER — Telehealth: Payer: Self-pay | Admitting: *Deleted

## 2021-04-12 ENCOUNTER — Inpatient Hospital Stay (HOSPITAL_COMMUNITY)
Admission: EM | Admit: 2021-04-12 | Discharge: 2021-04-15 | DRG: 378 | Disposition: A | Discharge status: Home / Self Care | Payer: Medicare PPO | Attending: Internal Medicine | Admitting: Internal Medicine

## 2021-04-12 ENCOUNTER — Encounter (HOSPITAL_COMMUNITY): Payer: Self-pay

## 2021-04-12 ENCOUNTER — Other Ambulatory Visit (INDEPENDENT_AMBULATORY_CARE_PROVIDER_SITE_OTHER): Payer: Medicare PPO

## 2021-04-12 ENCOUNTER — Other Ambulatory Visit: Payer: Self-pay

## 2021-04-12 DIAGNOSIS — Z86718 Personal history of other venous thrombosis and embolism: Secondary | ICD-10-CM | POA: Diagnosis not present

## 2021-04-12 DIAGNOSIS — K31811 Angiodysplasia of stomach and duodenum with bleeding: Principal | ICD-10-CM | POA: Diagnosis present

## 2021-04-12 DIAGNOSIS — I272 Pulmonary hypertension, unspecified: Secondary | ICD-10-CM

## 2021-04-12 DIAGNOSIS — I10 Essential (primary) hypertension: Secondary | ICD-10-CM | POA: Diagnosis present

## 2021-04-12 DIAGNOSIS — Z853 Personal history of malignant neoplasm of breast: Secondary | ICD-10-CM | POA: Diagnosis not present

## 2021-04-12 DIAGNOSIS — Z85528 Personal history of other malignant neoplasm of kidney: Secondary | ICD-10-CM

## 2021-04-12 DIAGNOSIS — Z20822 Contact with and (suspected) exposure to covid-19: Secondary | ICD-10-CM | POA: Diagnosis present

## 2021-04-12 DIAGNOSIS — N183 Chronic kidney disease, stage 3 unspecified: Secondary | ICD-10-CM | POA: Diagnosis not present

## 2021-04-12 DIAGNOSIS — K5730 Diverticulosis of large intestine without perforation or abscess without bleeding: Secondary | ICD-10-CM | POA: Diagnosis present

## 2021-04-12 DIAGNOSIS — N1831 Chronic kidney disease, stage 3a: Secondary | ICD-10-CM | POA: Diagnosis not present

## 2021-04-12 DIAGNOSIS — K922 Gastrointestinal hemorrhage, unspecified: Secondary | ICD-10-CM | POA: Diagnosis present

## 2021-04-12 DIAGNOSIS — D62 Acute posthemorrhagic anemia: Secondary | ICD-10-CM | POA: Diagnosis present

## 2021-04-12 DIAGNOSIS — K921 Melena: Secondary | ICD-10-CM

## 2021-04-12 DIAGNOSIS — I313 Pericardial effusion (noninflammatory): Secondary | ICD-10-CM | POA: Diagnosis not present

## 2021-04-12 DIAGNOSIS — I130 Hypertensive heart and chronic kidney disease with heart failure and stage 1 through stage 4 chronic kidney disease, or unspecified chronic kidney disease: Secondary | ICD-10-CM | POA: Diagnosis not present

## 2021-04-12 DIAGNOSIS — K5520 Angiodysplasia of colon without hemorrhage: Secondary | ICD-10-CM

## 2021-04-12 DIAGNOSIS — D631 Anemia in chronic kidney disease: Secondary | ICD-10-CM | POA: Diagnosis not present

## 2021-04-12 DIAGNOSIS — Z7952 Long term (current) use of systemic steroids: Secondary | ICD-10-CM | POA: Diagnosis not present

## 2021-04-12 DIAGNOSIS — D649 Anemia, unspecified: Secondary | ICD-10-CM | POA: Diagnosis not present

## 2021-04-12 DIAGNOSIS — K5521 Angiodysplasia of colon with hemorrhage: Secondary | ICD-10-CM | POA: Diagnosis not present

## 2021-04-12 DIAGNOSIS — I2729 Other secondary pulmonary hypertension: Secondary | ICD-10-CM | POA: Diagnosis present

## 2021-04-12 DIAGNOSIS — K31819 Angiodysplasia of stomach and duodenum without bleeding: Secondary | ICD-10-CM | POA: Diagnosis not present

## 2021-04-12 DIAGNOSIS — Z79899 Other long term (current) drug therapy: Secondary | ICD-10-CM

## 2021-04-12 DIAGNOSIS — I5032 Chronic diastolic (congestive) heart failure: Secondary | ICD-10-CM | POA: Diagnosis not present

## 2021-04-12 DIAGNOSIS — R54 Age-related physical debility: Secondary | ICD-10-CM | POA: Diagnosis not present

## 2021-04-12 DIAGNOSIS — M341 CR(E)ST syndrome: Secondary | ICD-10-CM | POA: Diagnosis not present

## 2021-04-12 DIAGNOSIS — Z86711 Personal history of pulmonary embolism: Secondary | ICD-10-CM

## 2021-04-12 DIAGNOSIS — N1832 Chronic kidney disease, stage 3b: Secondary | ICD-10-CM | POA: Diagnosis present

## 2021-04-12 DIAGNOSIS — I5033 Acute on chronic diastolic (congestive) heart failure: Secondary | ICD-10-CM | POA: Diagnosis not present

## 2021-04-12 DIAGNOSIS — K449 Diaphragmatic hernia without obstruction or gangrene: Secondary | ICD-10-CM | POA: Diagnosis present

## 2021-04-12 LAB — COMPREHENSIVE METABOLIC PANEL
ALT: 10 U/L (ref 0–35)
ALT: 15 U/L (ref 0–44)
AST: 17 U/L (ref 0–37)
AST: 22 U/L (ref 15–41)
Albumin: 4.2 g/dL (ref 3.5–5.2)
Albumin: 4.1 g/dL (ref 3.5–5.0)
Alkaline Phosphatase: 30 U/L — ABNORMAL LOW (ref 39–117)
Alkaline Phosphatase: 31 U/L — ABNORMAL LOW (ref 38–126)
Anion gap: 8 (ref 5–15)
BUN: 44 mg/dL — ABNORMAL HIGH (ref 6–23)
BUN: 45 mg/dL — ABNORMAL HIGH (ref 8–23)
CO2: 29 mEq/L (ref 19–32)
CO2: 29 mmol/L (ref 22–32)
Calcium: 9.2 mg/dL (ref 8.4–10.5)
Calcium: 9 mg/dL (ref 8.9–10.3)
Chloride: 97 mEq/L (ref 96–112)
Chloride: 99 mmol/L (ref 98–111)
Creatinine, Ser: 1.5 mg/dL — ABNORMAL HIGH (ref 0.40–1.20)
Creatinine, Ser: 1.54 mg/dL — ABNORMAL HIGH (ref 0.44–1.00)
GFR, Estimated: 34 mL/min — ABNORMAL LOW (ref >60)
GFR: 32.22 mL/min — ABNORMAL LOW (ref >60.00)
Glucose, Bld: 131 mg/dL — ABNORMAL HIGH (ref 70–99)
Glucose, Bld: 105 mg/dL — ABNORMAL HIGH (ref 70–99)
Potassium: 3.7 mEq/L (ref 3.5–5.1)
Potassium: 4.1 mmol/L (ref 3.5–5.1)
Sodium: 136 mEq/L (ref 135–145)
Sodium: 136 mmol/L (ref 135–145)
Total Bilirubin: 0.5 mg/dL (ref 0.2–1.2)
Total Bilirubin: 0.6 mg/dL (ref 0.3–1.2)
Total Protein: 6.3 g/dL (ref 6.0–8.3)
Total Protein: 6.4 g/dL — ABNORMAL LOW (ref 6.5–8.1)

## 2021-04-12 LAB — CBC WITH DIFFERENTIAL/PLATELET
Abs Immature Granulocytes: 0.04 10*3/uL (ref 0.00–0.07)
Basophils Absolute: 0 10*3/uL (ref 0.0–0.1)
Basophils Relative: 0 %
Eosinophils Absolute: 0 10*3/uL (ref 0.0–0.5)
Eosinophils Relative: 0 %
HCT: 24.3 % — ABNORMAL LOW (ref 36.0–46.0)
Hemoglobin: 7.5 g/dL — ABNORMAL LOW (ref 12.0–15.0)
Immature Granulocytes: 1 %
Lymphocytes Relative: 12 %
Lymphs Abs: 0.9 10*3/uL (ref 0.7–4.0)
MCH: 32.6 pg (ref 26.0–34.0)
MCHC: 30.9 g/dL (ref 30.0–36.0)
MCV: 105.7 fL — ABNORMAL HIGH (ref 80.0–100.0)
Monocytes Absolute: 0.4 10*3/uL (ref 0.1–1.0)
Monocytes Relative: 6 %
Neutro Abs: 5.8 10*3/uL (ref 1.7–7.7)
Neutrophils Relative %: 81 %
Platelets: 286 10*3/uL (ref 150–400)
RBC: 2.3 MIL/uL — ABNORMAL LOW (ref 3.87–5.11)
RDW: 18.2 % — ABNORMAL HIGH (ref 11.5–15.5)
WBC: 7.2 10*3/uL (ref 4.0–10.5)
nRBC: 0 % (ref 0.0–0.2)

## 2021-04-12 LAB — RESP PANEL BY RT-PCR (FLU A&B, COVID) ARPGX2
Influenza A by PCR: NEGATIVE
Influenza B by PCR: NEGATIVE
SARS Coronavirus 2 by RT PCR: NEGATIVE

## 2021-04-12 LAB — PROTIME-INR
INR: 1 (ref 0.8–1.2)
Prothrombin Time: 13.2 seconds (ref 11.4–15.2)

## 2021-04-12 LAB — CBC
HCT: 23.4 % — CL (ref 36.0–46.0)
Hemoglobin: 7.8 g/dL — CL (ref 12.0–15.0)
MCHC: 33.3 g/dL (ref 30.0–36.0)
MCV: 98.3 fl (ref 78.0–100.0)
Platelets: 298 10*3/uL (ref 150.0–400.0)
RBC: 2.38 Mil/uL — ABNORMAL LOW (ref 3.87–5.11)
RDW: 19.5 % — ABNORMAL HIGH (ref 11.5–15.5)
WBC: 7.2 10*3/uL (ref 4.0–10.5)

## 2021-04-12 LAB — PREPARE RBC (CROSSMATCH)

## 2021-04-12 MED ORDER — SPIRONOLACTONE 12.5 MG HALF TABLET
12.5000 mg | ORAL_TABLET | Freq: Every day | ORAL | Status: DC
  Administered 2021-04-14 – 2021-04-15 (×2): 12.5 mg via ORAL
  Filled 2021-04-12 (×3): qty 1

## 2021-04-12 MED ORDER — ACETAMINOPHEN 325 MG PO TABS: 650.0000 mg | ORAL_TABLET | Freq: Four times a day (QID) | ORAL | Status: DC | PRN

## 2021-04-12 MED ORDER — ACETAMINOPHEN 650 MG RE SUPP: 650.0000 mg | Freq: Four times a day (QID) | RECTAL | Status: DC | PRN

## 2021-04-12 MED ORDER — ATOVAQUONE 750 MG/5ML PO SUSP
750.0000 mg | Freq: Every day | ORAL | Status: DC
  Administered 2021-04-13 – 2021-04-14 (×3): 750 mg via ORAL
  Filled 2021-04-12 (×3): qty 5

## 2021-04-12 MED ORDER — ONDANSETRON HCL 4 MG/2ML IJ SOLN
4.0000 mg | Freq: Four times a day (QID) | INTRAMUSCULAR | Status: DC | PRN
  Administered 2021-04-15: 4 mg via INTRAVENOUS

## 2021-04-12 MED ORDER — MACITENTAN 10 MG PO TABS
10.0000 mg | ORAL_TABLET | Freq: Every day | ORAL | Status: DC
  Administered 2021-04-14 – 2021-04-15 (×2): 10 mg via ORAL
  Filled 2021-04-12 (×3): qty 1

## 2021-04-12 MED ORDER — DILTIAZEM HCL ER COATED BEADS 120 MG PO CP24
120.0000 mg | ORAL_CAPSULE | Freq: Every day | ORAL | Status: DC
  Administered 2021-04-14 – 2021-04-15 (×2): 120 mg via ORAL
  Filled 2021-04-12 (×2): qty 1

## 2021-04-12 MED ORDER — ALPRAZOLAM 0.25 MG PO TABS: 0.1250 mg | ORAL_TABLET | Freq: Every evening | ORAL | Status: DC | PRN

## 2021-04-12 MED ORDER — PANTOPRAZOLE INFUSION (NEW) - SIMPLE MED
8.0000 mg/h | INTRAVENOUS | Status: DC
  Administered 2021-04-12 – 2021-04-15 (×5): 8 mg/h via INTRAVENOUS
  Filled 2021-04-12 (×2): qty 100
  Filled 2021-04-12 (×2): qty 80
  Filled 2021-04-12 (×3): qty 100
  Filled 2021-04-12: qty 80

## 2021-04-12 MED ORDER — FUROSEMIDE 20 MG PO TABS
20.0000 mg | ORAL_TABLET | Freq: Every day | ORAL | Status: DC
  Administered 2021-04-14 – 2021-04-15 (×2): 20 mg via ORAL
  Filled 2021-04-12 (×2): qty 1

## 2021-04-12 MED ORDER — MACITENTAN 10 MG PO TABS: 10.0000 mg | ORAL_TABLET | Freq: Every day | ORAL | Status: DC

## 2021-04-12 MED ORDER — SODIUM CHLORIDE 0.9 % IV SOLN: 10.0000 mL/h | Freq: Once | INTRAVENOUS | Status: DC

## 2021-04-12 MED ORDER — ALBUTEROL SULFATE HFA 108 (90 BASE) MCG/ACT IN AERS: 2.0000 | INHALATION_SPRAY | Freq: Four times a day (QID) | RESPIRATORY_TRACT | Status: DC | PRN

## 2021-04-12 MED ORDER — PREDNISONE 1 MG PO TABS
1.0000 mg | ORAL_TABLET | Freq: Three times a day (TID) | ORAL | Status: DC
  Administered 2021-04-13: 1 mg via ORAL
  Filled 2021-04-12 (×2): qty 1

## 2021-04-12 MED ORDER — MONTELUKAST SODIUM 10 MG PO TABS
10.0000 mg | ORAL_TABLET | Freq: Every day | ORAL | Status: DC
  Administered 2021-04-13 – 2021-04-14 (×3): 10 mg via ORAL
  Filled 2021-04-12 (×3): qty 1

## 2021-04-12 MED ORDER — PANTOPRAZOLE 80MG IVPB - SIMPLE MED
80.0000 mg | Freq: Once | INTRAVENOUS | Status: AC
  Administered 2021-04-12: 80 mg via INTRAVENOUS
  Filled 2021-04-12: qty 80

## 2021-04-12 MED ORDER — ONDANSETRON HCL 4 MG PO TABS: 4.0000 mg | ORAL_TABLET | Freq: Four times a day (QID) | ORAL | Status: DC | PRN

## 2021-04-12 MED ORDER — ALBUTEROL SULFATE (2.5 MG/3ML) 0.083% IN NEBU: 2.5000 mg | INHALATION_SOLUTION | Freq: Four times a day (QID) | RESPIRATORY_TRACT | Status: DC | PRN

## 2021-04-12 MED ORDER — FLUTICASONE PROPIONATE 50 MCG/ACT NA SUSP: 1.0000 | Freq: Every day | NASAL | Status: DC | PRN

## 2021-04-12 NOTE — ED Notes (Signed)
ED Provider at bedside.

## 2021-04-12 NOTE — ED Provider Notes (Signed)
Emergency Medicine Provider Triage Evaluation Note  Paula Ward , a 85 y.o. female  was evaluated in triage.  Pt complains of recurrence of dark stools over last few days. HGB 7.8 today at 12pm, HGB 8.4 on 6/21. Dark stools two weeks ago as well.    Review of Systems  Positive: Low HGB, dark stools, generalized weakness, fatigue Negative: Syncope, chest pain, SOB  Physical Exam  BP (!) 126/50 (BP Location: Left Arm)   Pulse 90   Temp 98.3 F (36.8 C) (Oral)   Resp 16   SpO2 95%  Gen:   Awake, no distress   Resp:  Normal effort  MSK:   Moves extremities without difficulty  Other:    Medical Decision Making  Medically screening exam initiated at 5:40 PM.  Appropriate orders placed.  Paula Ward was informed that the remainder of the evaluation will be completed by another provider, this initial triage assessment does not replace that evaluation, and the importance of remaining in the ED until their evaluation is complete.     Paula Bender, PA-C 04/12/21 1744    Little, Wenda Overland, MD 04/13/21 Paula Ward

## 2021-04-12 NOTE — ED Triage Notes (Signed)
Pt was seen here a couple weeks ago for rectal bleeding. Pt has lab work done 2 days ago and her hemoglobin dropped down to 8. Pt endorses black stool since her last visit. Pt denies abdominal pain.

## 2021-04-12 NOTE — Telephone Encounter (Signed)
Notified pt with MD recommendations in absence of PCP. Pt agreed and states she will go the ER on [address] since that is the one close to her house.Marland KitchenJohny Chess

## 2021-04-12 NOTE — H&P (Addendum)
History and Physical    Paula Ward ZSM:[id number] DOB: November 02, 1935 DOA: 04/12/2021  PCP: Hoyt Koch, MD  Patient coming from: Home  I have personally briefly reviewed patient's old medical records in [place]  Chief Complaint: Melena, HGB drop  HPI: Paula Ward is a 85 y.o. 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  Pt recently admitted 14 days ago for UGIB with melena.  Transfused PRBC that admit.  EGD found AVMs but nothing actively bleeding.  Pt discharged on PPI.  After discharge pt had normal stool initially, but over the past few days has had recurrence of melena.  HGB has been followed closely by her PCP and has gone from 9.4 on 6/13 to 8.4 on 6/21 to 7.8 today prompting her PCP to send her back in to ED for recurrent UGIB.  Pt denies abd pain, N/V.  Pt off of all anticoagulants including ASA at this point and has been since before last admit due to h/o GIBs.   ED Course: HGB 7.5 in ED.  EDP spoke with Dr: Havery Moros  1u PRBC transfusion ordered.  Hospitalist asked to admit.   Review of Systems: As per HPI, otherwise all review of systems negative.  Past Medical History:  Diagnosis Date   Anemia    Angiodysplasia of stomach and duodenum    Arthus phenomenon    AVM (arteriovenous malformation)    Blood transfusion without reported diagnosis    last 4 units 12-22-15, Iron infusion x2 last -01-07-16,01-14-16.   Breast cancer ([place]) 1989   Left   Candida esophagitis (HCC)    Cataract    Chronic kidney disease    Chronic mild renal insuffiency   CREST syndrome (HCC)    GERD (gastroesophageal reflux disease)    w/ HH   Interstitial lung disease (HCC)    Nodule of kidney    PONV (postoperative nausea and vomiting)    Pulmonary embolus ([place]) 2003   Pulmonary hypertension ([place])    followed by Dr Gaynell Face at [facility], now Dr. Maryjean Ka visit end 2'17.   Rectal incontinence    Renal cell carcinoma (HCC)    Scleroderma (HCC)    Tubular adenoma of colon    Uterine polyp     Past Surgical History:  Procedure Laterality Date   APPENDECTOMY  1962   BREAST LUMPECTOMY  1989   left   CATARACT EXTRACTION, BILATERAL Bilateral 12/2013   ENTEROSCOPY N/A 01/18/2016   Procedure: ENTEROSCOPY;  Surgeon: Mauri Pole, MD;  Location: [facility];  Service: Endoscopy;  Laterality: N/A;   ENTEROSCOPY N/A 05/24/2018   Procedure: ENTEROSCOPY;  Surgeon: Jackquline Denmark, MD;  Location: [facility];  Service: Endoscopy;  Laterality: N/A;   ENTEROSCOPY N/A 03/29/2021   Procedure: ENTEROSCOPY;  Surgeon: Jerene Bears, MD;  Location: [facility];  Service: Gastroenterology;  Laterality: N/A;   ESOPHAGOGASTRODUODENOSCOPY (EGD) WITH PROPOFOL N/A 12/21/2015   Procedure: ESOPHAGOGASTRODUODENOSCOPY (EGD) WITH PROPOFOL;  Surgeon: Irene Shipper, MD;  Location: [facility];  Service: Endoscopy;  Laterality: N/A;   HOT HEMOSTASIS N/A 05/24/2018   Procedure: HOT HEMOSTASIS (ARGON PLASMA COAGULATION/BICAP);  Surgeon: Jackquline Denmark, MD;  Location: [facility];  Service: Endoscopy;  Laterality: N/A;   HOT HEMOSTASIS N/A 03/29/2021   Procedure: HOT HEMOSTASIS (ARGON PLASMA COAGULATION/BICAP);  Surgeon: Jerene Bears, MD;  Location: [facility];  Service: Gastroenterology;  Laterality: N/A;  IR GENERIC HISTORICAL  06/05/2016   IR RADIOLOGIST EVAL & MGMT 06/05/2016 Aletta Edouard, MD [facility] INTERV RAD   IVC Filter     KIDNEY SURGERY     left -"laser surgery by Dr. Kathlene Cote- 5 yrs ago" no removal   SCHLEROTHERAPY  05/24/2018   Procedure: SCHLEROTHERAPY;  Surgeon: Jackquline Denmark, MD;  Location: [facility];  Service: Endoscopy;;   TONSILLECTOMY     TUBAL LIGATION       reports that she has never smoked. She has never used smokeless tobacco. She reports that she does not drink alcohol and does not use drugs.  Allergies  Allergen Reactions   Codeine  Nausea Only    Hallucinations, too   Other Nausea And Vomiting    "-mycin" antibiotics.   Also cause hallucinations.   Erythromycin Nausea And Vomiting   Lisinopril     Pt doesn't remember    Mycophenolate Mofetil Nausea Only   Sulfa Antibiotics Other (See Comments)    unknown    Family History  Problem Relation Age of Onset   Bladder Cancer Father    Diabetes Father    Prostate cancer Father    Alzheimer's disease Mother    Diabetes Sister    Lung cancer Sister         smoker   Esophageal cancer Paternal Uncle    Colon cancer Neg Hx      Prior to Admission medications   Medication Sig Start Date End Date Taking? Authorizing Provider  ACCU-CHEK AVIVA PLUS test strip  06/04/20   [provider]  acetaminophen (TYLENOL) 325 MG tablet Take 650 mg by mouth every 6 (six) hours as needed for mild pain.     [provider]  albuterol (VENTOLIN HFA) 108 (90 Base) MCG/ACT inhaler INHALE 2 PUFFS INTO THE LUNGS EVERY 6 HOURS AS NEEDED FOR WHEEZING OR SHORTNESS OF BREATH 03/06/20   Hoyt Koch, MD  ALPRAZolam Duanne Moron) 0.25 MG tablet Take 0.5 tablets (0.125 mg total) by mouth at bedtime as needed. 10/29/20   Hoyt Koch, MD  atovaquone [facility]) 750 MG/5ML suspension Take 750 mg by mouth at bedtime. 07/10/20   [provider]  bacitracin ointment Apply 1 application topically 2 (two) times daily. 10/10/18   Charlesetta Shanks, MD  diltiazem (CARDIZEM CD) 120 MG 24 hr capsule Take 120 mg by mouth daily.    [provider]  fluticasone (FLONASE) 50 MCG/ACT nasal spray INSTILL 1 SPRAY INTO EACH NOSTRIL TWICE A DAY IF NEEDED 09/18/20   Hoyt Koch, MD  furosemide (LASIX) 20 MG tablet Take 1 tablet (20 mg total) by mouth daily. 04/03/21   Hoyt Koch, MD  lidocaine (XYLOCAINE) 2 % solution Use as directed 5 mLs in the mouth or throat every 3 (three) hours as needed for mouth pain. 10/19/18   Hoyt Koch, MD  meclizine  (ANTIVERT) 12.5 MG tablet Take 1-2 tablets (12.5-25 mg total) by mouth 3 (three) times daily as needed for dizziness. 10/26/17   Binnie Rail, MD  montelukast (SINGULAIR) 10 MG tablet TAKE 1 TABLET(10 MG) BY MOUTH AT BEDTIME 01/07/21   Hoyt Koch, MD  Mouthwashes (DRY MOUTH MOUTHWASH) LIQD Use as directed 5 mLs in the mouth or throat daily as needed (dry mouth). Biotene 04/01/21   Shelly Coss, MD  Multiple Vitamin (MULTIVITAMIN WITH MINERALS) TABS tablet Take 1 tablet by mouth daily. NO IRON    [provider]  mupirocin cream (BACTROBAN) 2 % Apply 1  application topically 2 (two) times daily. 03/22/18   Burns, Claudina Lick, MD  OPSUMIT 10 MG TABS Take 10 mg by mouth daily. 08/16/15   [provider]  pantoprazole (PROTONIX) 40 MG tablet Take 1 tablet (40 mg total) by mouth daily. 04/01/21   Shelly Coss, MD  pilocarpine (SALAGEN) 5 MG tablet TAKE 1 TABLET(5 MG) BY MOUTH THREE TIMES DAILY AS NEEDED 12/19/19   Hoyt Koch, MD  predniSONE (DELTASONE) 1 MG tablet Take 1 mg by mouth in the morning, at noon, in the evening, and at bedtime.    [provider]  spironolactone (ALDACTONE) 25 MG tablet Take 12.5 mg by mouth daily. 06/09/20   [provider]  sucralfate (CARAFATE) 1 GM/10ML suspension TAKE 10 MLS BY MOUTH AT BEDTIME 09/17/20   Mauri Pole, MD    Physical Exam: Vitals:   04/12/21 1930 04/12/21 2100 04/12/21 2115 04/12/21 2130  BP: 132/60 138/66 (!) 129/48 (!) 142/51  Pulse: 73 80 76 81  Resp: _0 Temp:    98.2 F (36.8 C)  TempSrc:    Oral  SpO2: 96% 97% 97% 97%    Constitutional: NAD, calm, comfortable Eyes: PERRL, lids and conjunctivae normal ENMT: Mucous membranes are moist. Posterior pharynx clear of any exudate or lesions.Normal dentition.  Neck: normal, supple, no masses, no thyromegaly Respiratory: clear to auscultation bilaterally, no wheezing, no crackles. Normal respiratory effort. No accessory muscle use.   Cardiovascular: Regular rate and rhythm, no murmurs / rubs / gallops. No extremity edema. 2+ pedal pulses. No carotid bruits.  Abdomen: no tenderness, no masses palpated. No hepatosplenomegaly. Bowel sounds positive.  Musculoskeletal: no clubbing / cyanosis. No joint deformity upper and lower extremities. Good ROM, no contractures. Normal muscle tone.  Skin: no rashes, lesions, ulcers. No induration Neurologic: CN 2-12 grossly intact. Sensation intact, DTR normal. Strength 5/5 in all 4.  Psychiatric: Normal judgment and insight. Alert and oriented x 3. Normal mood.    Labs on Admission: I have personally reviewed following labs and imaging studies  CBC: Recent Labs  Lab 04/09/21 1339 04/12/21 1208 04/12/21 1758  WBC 7.4 7.2 7.2  NEUTROABS  --   --  5.8  HGB 8.4 Repeated and verified X2.* 7.8 Repeated and verified X2.* 7.5*  HCT 25.1 Repeated and verified X2.* 23.4 Repeated and verified X2.* 24.3*  MCV 98.0 98.3 105.7*  PLT 263.0 298.0 270   Basic Metabolic Panel: Recent Labs  Lab 04/09/21 1339 04/12/21 1208 04/12/21 1758  NA 137 136 136  K 4.0 3.7 4.1  CL 99 97 99  CO2 _1 GLUCOSE 101* 131* 105*  BUN 43* 44* 45*  CREATININE 1.48* 1.50* 1.54*  CALCIUM 9.6 9.2 9.0   GFR: Estimated Creatinine Clearance: 21.7 mL/min (A) (by C-G formula based on SCr of 1.54 mg/dL (H)). Liver Function Tests: Recent Labs  Lab 04/09/21 1339 04/12/21 1208 04/12/21 1758  AST _2 ALT _3 ALKPHOS 31* 30* 31*  BILITOT 0.5 0.5 0.6  PROT 6.5 6.3 6.4*  ALBUMIN 4.3 4.2 4.1   No results for input(s): LIPASE, AMYLASE in the last 168 hours. No results for input(s): AMMONIA in the last 168 hours. Coagulation Profile: Recent Labs  Lab 04/12/21 2045  INR 1.0   Cardiac Enzymes: No results for input(s): CKTOTAL, CKMB, CKMBINDEX, TROPONINI in the last 168 hours. BNP (last 3 results) Recent Labs    03/05/21 1136 03/08/21 0910  PROBNP 1,277.0* 1,554.0*  HbA1C: No  results for input(s): HGBA1C in the last 72 hours. CBG: No results for input(s): GLUCAP in the last 168 hours. Lipid Profile: No results for input(s): CHOL, HDL, LDLCALC, TRIG, CHOLHDL, LDLDIRECT in the last 72 hours. Thyroid Function Tests: No results for input(s): TSH, T4TOTAL, FREET4, T3FREE, THYROIDAB in the last 72 hours. Anemia Panel: No results for input(s): VITAMINB12, FOLATE, FERRITIN, TIBC, IRON, RETICCTPCT in the last 72 hours. Urine analysis:    Component Value Date/Time   COLORURINE YELLOW 03/30/2021 [place] 03/30/2021 1416   LABSPEC 1.013 03/30/2021 1416   PHURINE 5.0 03/30/2021 1416   GLUCOSEU NEGATIVE 03/30/2021 [place] 01/10/2021 0958   HGBUR NEGATIVE 03/30/2021 1416   BILIRUBINUR NEGATIVE 03/30/2021 1416   BILIRUBINUR Neg 09/03/2017 1529   KETONESUR NEGATIVE 03/30/2021 1416   PROTEINUR NEGATIVE 03/30/2021 1416   UROBILINOGEN 0.2 01/10/2021 0958   NITRITE NEGATIVE 03/30/2021 1416   LEUKOCYTESUR NEGATIVE 03/30/2021 1416    Radiological Exams on Admission: No results found.  EKG: Independently reviewed.  Assessment/Plan Principal Problem:   Acute on chronic blood loss anemia Active Problems:   Essential hypertension   Pulmonary hypertension (HCC)   Angiodysplasia of stomach and duodenum   PULMONARY EMBOLISM, HX OF   CKD (chronic kidney disease) stage 3, GFR 30-59 ml/min (HCC)   Acute upper GI bleed    Acute on chronic blood loss anemia from recurrent UGIB - Suspect AVM bleeding again Per GI: NPO after MN 1u PRBC transfusion PPI gtt Plan for repeat EGD in AM most likely Tele monitor INR 1.0 Repeat CBC in AM HTN - Cont home BP meds PAH - Cont home Opsumit H/o PE - No anticoagulants at this point nor recently due to recurrent GIBs (including admission today) CKD 3 - Chronic, stable, baseline Scleroderma - Cont 37m prednisone TID  DVT prophylaxis: SCDs Code Status: Full Family Communication: No family in  room Disposition Plan: Home after GIB stopped and HGB stable Consults called: Dr. AHavery MorosAdmission status: Admit to inpatient  Severity of Illness: The appropriate patient status for this patient is INPATIENT. Inpatient status is judged to be reasonable and necessary in order to provide the required intensity of service to ensure the patient's safety. The patient's presenting symptoms, physical exam findings, and initial radiographic and laboratory data in the context of their chronic comorbidities is felt to place them at high risk for further clinical deterioration. Furthermore, it is not anticipated that the patient will be medically stable for discharge from the hospital within 2 midnights of admission. The following factors support the patient status of inpatient.   IP status for UGIB with melena, ABLA, need for transfusion, need for repeat EGD.   * I certify that at the point of admission it is my clinical judgment that the patient will require inpatient hospital care spanning beyond 2 midnights from the point of admission due to high intensity of service, high risk for further deterioration and high frequency of surveillance required.*   Gaynelle Pastrana M. DO [facility]  How to contact the [facility] HealthcareAttending or Consulting provider [place] covering provider during after hours 7[place]. [place] for this patient?  Check the care team in [facility] Hospitaland look for a) attending/consulting [facility] provider listed and b) the TBo[facility] National Research Hospitalteam listed Log into [email or web address]  Amion Physician Scheduling and messaging for groups and whole hospitals  On call and physician scheduling software for group practices, residents, hospitalists and other medical providers for call, clinic,  rotation and shift schedules. On[employer] is a hospital-wide system for scheduling doctors and paging doctors on call. EasyPlot is for scientific plotting and data analysis.  [email or web address]  and use [place]'s universal password to  access. If you do not have the password, please contact the hospital operator.  Locate the [facility] provider you are looking for under [facility] and page to a number that you can be directly reached. If you still have difficulty reaching the provider, please page the [facility] (Director on Call) for the Hospitalists listed on amion for assistance.  04/12/2021, 9:58 PM

## 2021-04-12 NOTE — Telephone Encounter (Signed)
Patient calling to follow up on previous message.

## 2021-04-12 NOTE — Telephone Encounter (Signed)
Rec'd critical lab for Hemoglobin 7.8 & Hematocrit 23.4. Dr. Sharlet Salina is out of the office pls advise.Marland KitchenJohny Chess

## 2021-04-12 NOTE — Telephone Encounter (Signed)
Blood counts have gotten worse - this is concerning for continued GI bleeding - I think she needs to be evaluated in the ED since she could be bleeding her counts could continue to drop over the weekend.

## 2021-04-12 NOTE — Telephone Encounter (Signed)
Patient called and said that her stool is now darker than it was when she went to the hospital. She said that she has tried contacting GI and has not received a response. She said that she is going to have her lab work done today

## 2021-04-12 NOTE — Telephone Encounter (Signed)
Called patient back and let her know there were no new recommendations from our PA, except to get her H & H rechecked next week, as her PCP had ordered. She denies any SOB, dizziness, or BRB in her stool. I asked her to go to the ED if she did develop any of these symptoms. She stated her PCP said she could come into their lab today to have her H & H rechecked. That is her plan

## 2021-04-13 ENCOUNTER — Inpatient Hospital Stay (HOSPITAL_COMMUNITY): Payer: Medicare PPO | Admitting: Certified Registered Nurse Anesthetist

## 2021-04-13 ENCOUNTER — Encounter (HOSPITAL_COMMUNITY)
Admission: EM | Disposition: A | Discharge status: Home / Self Care | Payer: Self-pay | Source: Home / Self Care | Attending: Internal Medicine

## 2021-04-13 DIAGNOSIS — K5521 Angiodysplasia of colon with hemorrhage: Secondary | ICD-10-CM

## 2021-04-13 DIAGNOSIS — D649 Anemia, unspecified: Secondary | ICD-10-CM

## 2021-04-13 HISTORY — PX: SUBMUCOSAL TATTOO INJECTION: SHX6856

## 2021-04-13 HISTORY — PX: HOT HEMOSTASIS: SHX5433

## 2021-04-13 HISTORY — PX: ENTEROSCOPY: SHX5533

## 2021-04-13 LAB — BASIC METABOLIC PANEL
Anion gap: 7 (ref 5–15)
BUN: 39 mg/dL — ABNORMAL HIGH (ref 8–23)
CO2: 30 mmol/L (ref 22–32)
Calcium: 8.6 mg/dL — ABNORMAL LOW (ref 8.9–10.3)
Chloride: 100 mmol/L (ref 98–111)
Creatinine, Ser: 1.29 mg/dL — ABNORMAL HIGH (ref 0.44–1.00)
GFR, Estimated: 41 mL/min — ABNORMAL LOW (ref >60)
Glucose, Bld: 81 mg/dL (ref 70–99)
Potassium: 3.6 mmol/L (ref 3.5–5.1)
Sodium: 137 mmol/L (ref 135–145)

## 2021-04-13 LAB — CBC
HCT: 26.8 % — ABNORMAL LOW (ref 36.0–46.0)
Hemoglobin: 8.6 g/dL — ABNORMAL LOW (ref 12.0–15.0)
MCH: 31.9 pg (ref 26.0–34.0)
MCHC: 32.1 g/dL (ref 30.0–36.0)
MCV: 99.3 fL (ref 80.0–100.0)
Platelets: 258 10*3/uL (ref 150–400)
RBC: 2.7 MIL/uL — ABNORMAL LOW (ref 3.87–5.11)
RDW: 19.3 % — ABNORMAL HIGH (ref 11.5–15.5)
WBC: 6.6 10*3/uL (ref 4.0–10.5)
nRBC: 0 % (ref 0.0–0.2)

## 2021-04-13 SURGERY — ENTEROSCOPY
Anesthesia: Monitor Anesthesia Care

## 2021-04-13 MED ORDER — PROPOFOL 10 MG/ML IV BOLUS
INTRAVENOUS | Status: DC | PRN
  Administered 2021-04-13: 10 mg via INTRAVENOUS

## 2021-04-13 MED ORDER — PREDNISONE 1 MG PO TABS
1.0000 mg | ORAL_TABLET | Freq: Four times a day (QID) | ORAL | Status: DC
  Administered 2021-04-13 – 2021-04-15 (×8): 1 mg via ORAL
  Filled 2021-04-13 (×11): qty 1

## 2021-04-13 MED ORDER — SPOT INK MARKER SYRINGE KIT
PACK | SUBMUCOSAL | Status: DC | PRN
  Administered 2021-04-13: 3 mL via SUBMUCOSAL

## 2021-04-13 MED ORDER — PROPOFOL 500 MG/50ML IV EMUL
INTRAVENOUS | Status: DC | PRN
  Administered 2021-04-13: 100 ug/kg/min via INTRAVENOUS

## 2021-04-13 MED ORDER — LIDOCAINE 2% (20 MG/ML) 5 ML SYRINGE
INTRAMUSCULAR | Status: DC | PRN
  Administered 2021-04-13: 50 mg via INTRAVENOUS

## 2021-04-13 MED ORDER — EPINEPHRINE 1 MG/10ML IJ SOSY
PREFILLED_SYRINGE | INTRAMUSCULAR | Status: AC
  Filled 2021-04-13: qty 10

## 2021-04-13 MED ORDER — DIPHENHYDRAMINE HCL 25 MG PO CAPS
25.0000 mg | ORAL_CAPSULE | Freq: Four times a day (QID) | ORAL | Status: DC | PRN
  Filled 2021-04-13: qty 1

## 2021-04-13 MED ORDER — LACTATED RINGERS IV SOLN
INTRAVENOUS | Status: AC | PRN
  Administered 2021-04-13: 1000 mL via INTRAVENOUS

## 2021-04-13 MED ORDER — SPOT INK MARKER SYRINGE KIT
PACK | SUBMUCOSAL | Status: AC
  Filled 2021-04-13: qty 5

## 2021-04-13 MED ORDER — LACTATED RINGERS IV SOLN: INTRAVENOUS | Status: DC | PRN

## 2021-04-13 NOTE — Anesthesia Preprocedure Evaluation (Addendum)
Anesthesia Evaluation  Patient identified by MRN, date of birth, ID band Patient awake    Reviewed: Allergy & Precautions, NPO status , Patient's Chart, lab work & pertinent test results  History of Anesthesia Complications (+) PONV and history of anesthetic complications  Airway Mallampati: II  TM Distance: >3 FB Neck ROM: Full    Dental  (+) Teeth Intact, Dental Advisory Given   Pulmonary PE (2003) Interstitial lung dx   Pulmonary exam normal breath sounds clear to auscultation       Cardiovascular hypertension, Pt. on medications pulmonary hypertension (pHTN in history but normal PASP on last echo)+CHF (grade 1 diastolic dysfunction) and + Orthopnea  Normal cardiovascular exam Rhythm:Regular Rate:Normal  Echo 2019: - Left ventricle: The cavity size was normal. Wall thickness was  increased in a pattern of moderate LVH. Systolic function was  vigorous. The estimated ejection fraction was in the range of 65%  to 70%. Wall motion was normal; there were no regional wall  motion abnormalities. Doppler parameters are consistent with  abnormal left ventricular relaxation (grade 1 diastolic  dysfunction). GLS: -23.5%  - Mitral valve: Moderately calcified annulus.  - Left atrium: The atrium was severely dilated. Volume/bsa, ES  (1-plane Simpson&'s, A4C): 76.4 ml/m^2.  - Pericardium, extracardiac: A large, free-flowing pericardial  effusion was identified circumferential to the heart. There was  no evidence of hemodynamic compromise.      Neuro/Psych negative neurological ROS  negative psych ROS   GI/Hepatic Neg liver ROS, GERD  Medicated and Controlled,GIB   Endo/Other  negative endocrine ROS  Renal/GU Renal InsufficiencyRenal disease (hx RCC)Cr 1.29, CKD 3  negative genitourinary   Musculoskeletal  (+) Arthritis , Osteoarthritis,  Scleroderma- chronic prednisone   Abdominal   Peds   Hematology  (+) Blood dyscrasia, anemia , 8.6/26.8   Anesthesia Other Findings   Reproductive/Obstetrics negative OB ROS                           Anesthesia Physical Anesthesia Plan  ASA: 4  Anesthesia Plan: MAC   Post-op Pain Management:    Induction:   PONV Risk Score and Plan: 2 and Propofol infusion and TIVA  Airway Management Planned: Natural Airway and Simple Face Mask  Additional Equipment: None  Intra-op Plan:   Post-operative Plan:   Informed Consent: I have reviewed the patients History and Physical, chart, labs and discussed the procedure including the risks, benefits and alternatives for the proposed anesthesia with the patient or authorized representative who has indicated his/her understanding and acceptance.     Dental advisory given  Plan Discussed with: CRNA  Anesthesia Plan Comments:        Anesthesia Quick Evaluation

## 2021-04-13 NOTE — Op Note (Signed)
[facility] Patient Name: Paula Ward Procedure Date: 04/13/2021 MRN: [id number] Attending MD: Carlota Raspberry. 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 Havery Moros, MD, Glori Bickers, RN,                            William Dalton, Technician Referring MD:              Medicines:                Monitored Anesthesia Care Complications:            No immediate complications. Estimated blood loss:                            Minimal. Estimated Blood Loss:     Estimated blood loss was minimal. Procedure:                Pre-Anesthesia Assessment:                           - Prior to the procedure, a History and Physical                            was performed, and patient medications and                            allergies were reviewed. The patient's tolerance of                            previous anesthesia was also reviewed. The risks                            and benefits of the procedure and the sedation                            options and risks were discussed with the patient.                            All questions were answered, and informed consent  was obtained. Prior Anticoagulants: The patient has                            taken no previous anticoagulant or antiplatelet                            agents. ASA Grade Assessment: III - A patient with                            severe systemic disease. After reviewing the risks                            and benefits, the patient was deemed in                            satisfactory condition to  undergo the procedure.                           After obtaining informed consent, the endoscope was                            passed under direct vision. Throughout the                            procedure, the patient's blood pressure, pulse, and                            oxygen saturations were monitored continuously. The                            [id number]-[id number] ([id number] Olympus pediatric colonscope                            was introduced through the mouth and advanced to                            the proximal jejunum. The small bowel enteroscopy                            was accomplished without difficulty. The patient                            tolerated the procedure well. Scope In: Scope Out: Findings:      The examined esophagus was normal.      A small hiatal hernia was present.      A single small angiodysplastic lesion with no bleeding was found in the       gastric body. Fulguration to ablate the lesion to prevent bleeding by       argon plasma was successful.      The exam of the stomach was otherwise normal. There was a small       diverticulum in the fundus.      Multiple angiodysplastic lesions with no bleeding were found in the       second portion of the duodenum (1),  in the third portion of the duodenum       and in the fourth portion of the duodenum (4). Fulguration to ablate the       lesion to prevent bleeding by argon plasma was successful.      The exam of the duodenum was otherwise normal.      Multiple angiodysplastic lesions with no bleeding were found in the       proximal jejunum (3). Fulguration to ablate the lesion to prevent       bleeding by argon plasma was successful. Of note,,one of these was just       distal to the tattoo that was placed (seen after tattoo placed)      Exam of the jejunum was otherwise normal.      The distant extent of proximal jejunum that was reached was tattooed       with an injection of Spot (carbon black). Impression:                - Normal esophagus.                           - Small hiatal hernia.                           - A single non-bleeding angiodysplastic lesion in                            the stomach. Treated with argon plasma coagulation                            (APC).                           - Multiple non-bleeding angiodysplastic lesions in                            the duodenum. Treated with argon plasma coagulation                            (APC).                           - Multiple non-bleeding angiodysplastic lesions in                            the duodenum. Treated with argon plasma coagulation                            (APC).                           - Distant extent reached of proximal jejunum was                            tattooed.                           Overall, no active bleeding but numerous AVMs  noted, and suspect she likely has additional AVMs                            in the further distal small bowel and possibly                            colon. Another 8 AVMs treated in the proximal small                            bowel on this exam and single small AVM in the                            stomach. Recommendation:           - Return patient to hospital ward for ongoing care.                           - Clear liquid diet.                           - Continue present medications.                           - Monitor for recurrent bleeding                           - Consideration for colonoscopy to clear her colon                            (last exam 2015 and report of colonic AVMs on exams                            prior to that) in upcoming days prior to discharge,                            as well as capsule endoscopy if further bleeding is                            noted.                           - If patient continues to have recurrent bleeding                            from AVMs moving forward, can consider long active                             octreotide depot as outpatient (if this is                            available / covered by insurance)                           - GI inpatient service will continue to follow Procedure Code(s):        ---  Professional ---                           [phone], Small intestinal endoscopy, enteroscopy                            beyond second portion of duodenum, not including                            ileum; with control of bleeding (eg, injection,                            bipolar cautery, unipolar cautery, laser, heater                            probe, stapler, plasma coagulator)                           44799, Unlisted procedure, small intestine Diagnosis Code(s):        --- Professional ---                           K44.9, Diaphragmatic hernia without obstruction or                            gangrene                           K31.819, Angiodysplasia of stomach and duodenum                            without bleeding                           K92.1, Melena (includes Hematochezia)                           K55.21, Angiodysplasia of colon with hemorrhage CPT copyright 2019 [employer]. All rights reserved. The codes documented in this report are preliminary and upon coder review may  be revised to meet current compliance requirements. Remo Lipps P. Havery Moros, MD 04/13/2021 3:24:50 PM This report has been signed electronically. Number of Addenda: 0

## 2021-04-13 NOTE — Interval H&P Note (Signed)
History and Physical Interval Note: No changes since H / P this AM. I have discussed risks / benefits with the patient of enteroscopy and anesthesia and she wishes to proceed.   04/13/2021 2:03 PM  [name]  has presented today for surgery, with the diagnosis of upper GI bleed.  The various methods of treatment have been discussed with the patient and family. After consideration of risks, benefits and other options for treatment, the patient has consented to  Procedure(s): ENTEROSCOPY (N/A) as a surgical intervention.  The patient's history has been reviewed, patient examined, no change in status, stable for surgery.  I have reviewed the patient's chart and labs.  Questions were answered to the patient's satisfaction.     [place]

## 2021-04-13 NOTE — Consult Note (Signed)
Consultation  Referring Provider:     Jennette Kettle  Primary Care Physician:  Hoyt Koch, MD Primary Gastroenterologist:       Dr. Silverio Decamp  Reason for Consultation:     anemia, melena         HPI:   Paula Ward is a 85 y.o. 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  The patient was recently admitted on June 10 for an upper GI bleed.  She had endorsed a few days of weakness and new onset of melena leading up to that time.  She was found to have a hemoglobin of 7.1 when her baseline is nines to tens.  She underwent an enteroscopy with Dr. Hilarie Fredrickson on 6/10 showing AVMs in the third portion of the duodenum and proximal jejunum, with heme and stigmata of recent bleeding.  These were treated with APC.  The patient stabilized and was discharged a few days later.  She reports she had a [place] stool upon arriving home but since then had developed recurrent melena.  She has not been taking any iron supplementation outside of a multivitamin.  She denies any NSAID use or blood thinners.  She states she is having hard black stools, a few per day or so.  Her hemoglobin at discharge was 9.0.  On June 13 was 9.4, on June 21 had trended down to 8.4, and yesterday down trended to 7.5.  She feels fatigued again and symptoms are consistent with her prior bleeding that she had before.  I have reviewed her prior endoscopic procedures over the past few years.  She has had multiple AVMs treated in the small intestine over the years.  She had a remote colonoscopy showing AVMs there however last colonoscopy in 2015 reportedly showed no AVMs and severe diverticulosis.  She did have a capsule study done in March 2017 showing suspected AVMs in the jejunum but the procedure was incomplete.  She received 1 unit of blood overnight and her hemoglobin went from 7.5 to 8.6.  Her baseline BUN  seems to be around 30 or so.  Had risen to 35 yesterday with creatinine of 1.5.  He denies any pain.  She denies any cardiopulmonary symptoms that bother her at this time but does have some pulmonary hypertension and interstitial lung disease with some chronic associated respiratory symptoms.  She also has chronic kidney disease. She has been compliant with PPI at home.  Husband is present at the bedside today and we reviewed her history together.   Past Medical History:  Diagnosis Date  . Anemia   . Angiodysplasia of stomach and duodenum   . Arthus phenomenon   . AVM (arteriovenous malformation)   . Blood transfusion without reported diagnosis    last 4 units 12-22-15, Iron infusion x2 last -01-07-16,01-14-16.  . Breast cancer ([place]) 1989   Left  . Candida esophagitis ([place])   . Cataract   . Chronic kidney disease    Chronic mild renal insuffiency  . CREST syndrome ([place])   . GERD (gastroesophageal reflux disease)    w/ HH  . Interstitial lung disease ([place])   . Nodule of kidney   . PONV (postoperative nausea and vomiting)   . Pulmonary embolus ([place]) 2003  . Pulmonary hypertension ([facility])    followed by Dr Gaynell Face at [facility], now Dr. Maryjean Ka visit end 2'17.  . Rectal  incontinence   . Renal cell carcinoma ([place])   . Scleroderma ([place])   . Tubular adenoma of colon   . Uterine polyp     Past Surgical History:  Procedure Laterality Date  . APPENDECTOMY  1962  . BREAST LUMPECTOMY  1989   left  . CATARACT EXTRACTION, BILATERAL Bilateral 12/2013  . ENTEROSCOPY N/A 01/18/2016   Procedure: ENTEROSCOPY;  Surgeon: Mauri Pole, MD;  Location: [facility];  Service: Endoscopy;  Laterality: N/A;  . ENTEROSCOPY N/A 05/24/2018   Procedure: ENTEROSCOPY;  Surgeon: Jackquline Denmark, MD;  Location: [facility];  Service: Endoscopy;  Laterality: N/A;  . ENTEROSCOPY N/A 03/29/2021   Procedure: ENTEROSCOPY;  Surgeon: Jerene Bears, MD;  Location: [facility];  Service: Gastroenterology;   Laterality: N/A;  . ESOPHAGOGASTRODUODENOSCOPY (EGD) WITH PROPOFOL N/A 12/21/2015   Procedure: ESOPHAGOGASTRODUODENOSCOPY (EGD) WITH PROPOFOL;  Surgeon: Irene Shipper, MD;  Location: [facility];  Service: Endoscopy;  Laterality: N/A;  . HOT HEMOSTASIS N/A 05/24/2018   Procedure: HOT HEMOSTASIS (ARGON PLASMA COAGULATION/BICAP);  Surgeon: Jackquline Denmark, MD;  Location[facility];  Service: Endoscopy;  Laterality: N/A;  . HOT HEMOSTASIS N/A 03/29/2021   Procedure: HOT HEMOSTASIS (ARGON PLASMA COAGULATION/BICAP);  Surgeon: Jerene Bears, MD;  Location: [facility];  Service: Gastroenterology;  Laterality: N/A;  . IR GENERIC HISTORICAL  06/05/2016   IR RADIOLOGIST EVAL & MGMT 06/05/2016 Aletta Edouard, MD [facility] INTERV RAD  . IVC Filter    . KIDNEY SURGERY     left -"laser surgery by Dr. Kathlene Cote- 5 yrs ago" no removal  . SCHLEROTHERAPY  05/24/2018   Procedure: Woodward Ku;  Surgeon: Jackquline Denmark, MD;  Location: [facility];  Service: Endoscopy;;  . TONSILLECTOMY    . TUBAL LIGATION      Family History  Problem Relation Age of Onset  . Bladder Cancer Father   . Diabetes Father   . Prostate cancer Father   . Alzheimer's disease Mother   . Diabetes Sister   . Lung cancer Sister         smoker  . Esophageal cancer Paternal Uncle   . Colon cancer Neg Hx      Social History   Tobacco Use  . Smoking status: Never  . Smokeless tobacco: Never  Vaping Use  . Vaping Use: Never used  Substance Use Topics  . Alcohol use: No  . Drug use: No    Prior to Admission medications   Medication Sig Start Date End Date Taking? Authorizing Provider  acetaminophen (TYLENOL) 325 MG tablet Take 650 mg by mouth every 6 (six) hours as needed for mild pain.    Yes [provider]  albuterol (VENTOLIN HFA) 108 (90 Base) MCG/ACT inhaler INHALE 2 PUFFS INTO THE LUNGS EVERY 6 HOURS AS NEEDED FOR WHEEZING OR SHORTNESS OF BREATH 03/06/20  Yes Hoyt Koch, MD  ALPRAZolam Duanne Moron) 0.25 MG  tablet Take 0.5 tablets (0.125 mg total) by mouth at bedtime as needed. Patient taking differently: Take 0.125 mg by mouth at bedtime as needed for anxiety or sleep. 10/29/20  Yes Hoyt Koch, MD  atovaquone [facility]) 750 MG/5ML suspension Take 750 mg by mouth at bedtime. 07/10/20  Yes [provider]  diltiazem (CARDIZEM CD) 120 MG 24 hr capsule Take 120 mg by mouth daily.   Yes [provider]  fluticasone (FLONASE) 50 MCG/ACT nasal spray INSTILL 1 SPRAY INTO EACH NOSTRIL TWICE A DAY IF NEEDED Patient taking differently: Place 1 spray into both nostrils daily as needed  for allergies. 09/18/20  Yes Hoyt Koch, MD  furosemide (LASIX) 20 MG tablet Take 1 tablet (20 mg total) by mouth daily. 04/03/21  Yes Hoyt Koch, MD  meclizine (ANTIVERT) 12.5 MG tablet Take 1-2 tablets (12.5-25 mg total) by mouth 3 (three) times daily as needed for dizziness. 10/26/17  Yes Burns, Claudina Lick, MD  montelukast (SINGULAIR) 10 MG tablet TAKE 1 TABLET(10 MG) BY MOUTH AT BEDTIME 01/07/21  Yes Hoyt Koch, MD  Mouthwashes (DRY MOUTH MOUTHWASH) LIQD Use as directed 5 mLs in the mouth or throat daily as needed (dry mouth). Biotene 04/01/21  Yes Shelly Coss, MD  Multiple Vitamins-Iron (MULTIVITAMIN/IRON PO) Take 1 tablet by mouth daily.   Yes [provider]  OPSUMIT 10 MG TABS Take 10 mg by mouth daily. 08/16/15  Yes [provider]  pantoprazole (PROTONIX) 40 MG tablet Take 1 tablet (40 mg total) by mouth daily. 04/01/21  Yes Shelly Coss, MD  predniSONE (DELTASONE) 1 MG tablet Take 1 mg by mouth in the morning, at noon, in the evening, and at bedtime.   Yes [provider]  spironolactone (ALDACTONE) 25 MG tablet Take 12.5 mg by mouth daily. 06/09/20  Yes [provider]  [place] test strip  06/04/20   [provider]  sucralfate (CARAFATE) 1 GM/10ML suspension TAKE 10 MLS BY MOUTH AT BEDTIME Patient not taking:  Reported on 04/12/2021 09/17/20 04/12/21  Mauri Pole, MD    Current Facility-Administered Medications  Medication Dose Route Frequency Provider Last Rate Last Admin  . 0.9 %  sodium chloride infusion  10 mL/hr Intravenous Once Little, Wenda Overland, MD   Held at 04/12/21 1953  . acetaminophen (TYLENOL) tablet 650 mg  650 mg Oral Q6H PRN Etta Quill, DO       Or  . acetaminophen (TYLENOL) suppository 650 mg  650 mg Rectal Q6H PRN Etta Quill, DO      . albuterol (PROVENTIL) (2.5 MG/3ML) 0.083% nebulizer solution 2.5 mg  2.5 mg Nebulization Q6H PRN Etta Quill, DO      . ALPRAZolam Duanne Moron) tablet 0.125 mg  0.125 mg Oral QHS PRN Etta Quill, DO      . atovaquone (MEPRON) 750 MG/5ML suspension 750 mg  750 mg Oral QHS Jennette Kettle M, DO   750 mg at 04/13/21 0026  . diltiazem (CARDIZEM CD) 24 hr capsule 120 mg  120 mg Oral Daily Alcario Drought, Jared M, DO      . fluticasone [facility]) 50 MCG/ACT nasal spray 1 spray  1 spray Each Nare Daily PRN Etta Quill, DO      . furosemide (LASIX) tablet 20 mg  20 mg Oral Daily Alcario Drought, Jared M, DO      . macitentan (OPSUMIT) tablet 10 mg  10 mg Oral Daily Jennette Kettle M, DO      . montelukast (SINGULAIR) tablet 10 mg  10 mg Oral QHS Jennette Kettle M, DO   10 mg at 04/13/21 1275  . ondansetron (ZOFRAN) tablet 4 mg  4 mg Oral Q6H PRN Etta Quill, DO       Or  . ondansetron [facility]) injection 4 mg  4 mg Intravenous Q6H PRN Etta Quill, DO      . pantoprozole (PROTONIX) 80 mg /NS 100 mL infusion  8 mg/hr Intravenous Continuous Little, Wenda Overland, MD 10 mL/hr at 04/12/21 2118 8 mg/hr at 04/12/21 2118  . predniSONE (DELTASONE) tablet 1 mg  1 mg  Oral QID Caren Griffins, MD      . spironolactone (ALDACTONE) tablet 12.5 mg  12.5 mg Oral Daily Jennette Kettle M, DO        Allergies as of 04/12/2021 - Review Complete 04/12/2021  Allergen Reaction Noted  . Codeine Nausea Only 08/03/2008  . Other Nausea And Vomiting  04/05/2012  . Erythromycin Nausea And Vomiting 10/17/2014  . Lisinopril  12/14/2014  . Mycophenolate mofetil Nausea Only 09/23/2019  . Sulfa antibiotics Other (See Comments) 03/28/2021     Review of Systems:    As per HPI, otherwise negative    Physical Exam:  Vital signs in last 24 hours: Temp:  [97.7 F (36.5 C)-98.4 F (36.9 C)] 97.9 F (36.6 C) (06/25 0535) Pulse Rate:  [70-90] 88 (06/25 0535) Resp:  [16-24] 16 (06/25 0535) BP: (122-142)/(47-66) 132/59 (06/25 0535) SpO2:  [93 %-100 %] 94 % (06/25 0535) Last BM Date: 04/11/21 General:   Pleasant female in NAD Head:  Normocephalic and atraumatic. Eyes:   No icterus.   Conjunctiva pale Ears:  Normal auditory acuity. Neck:  Supple Lungs:  Respirations even and unlabored. Lungs relatively clear to auscultation bilaterally.    Heart:  Regular rate and rhythm; 2/6 SEM Abdomen:  Soft, nondistended, nontender.  Rectal:  Not performed.  Msk:  Symmetrical without gross deformities.  Extremities:  Without edema. Neurologic:  Alert and  oriented x4;  grossly normal neurologically. Skin:  Intact without significant lesions or rashes. Psych:  Alert and cooperative. Normal affect.  LAB RESULTS: Recent Labs    04/12/21 1208 04/12/21 1758 04/13/21 0615  WBC 7.2 7.2 6.6  HGB 7.8 Repeated and verified X2.* 7.5* 8.6*  HCT 23.4 Repeated and verified X2.* 24.3* 26.8*  PLT 298.0 286 258   BMET Recent Labs    04/12/21 1208 04/12/21 1758 04/13/21 0615  NA 136 136 137  K 3.7 4.1 3.6  CL 97 99 100  CO2 _0 GLUCOSE 131* 105* 81  BUN 44* 45* 39*  CREATININE 1.50* 1.54* 1.29*  CALCIUM 9.2 9.0 8.6*   LFT Recent Labs    04/12/21 1758  PROT 6.4*  ALBUMIN 4.1  AST 22  ALT 15  ALKPHOS 31*  BILITOT 0.6   PT/INR Recent Labs    04/12/21 2045  LABPROT 13.2  INR 1.0    STUDIES: No results found.     Impression / Plan:   85 year old female known to our service for history of AVM related bowel bleeding,  admitted with recurrent melena, anemia, presumably from AVMs in her bowel.  GI bleed - history of crest/scleroderma, multiple AVMs in the bowel Anemia  Unfortunately the patient has had recurrent bleeding with recurrent melena and anemia, sounds like low-grade bleeding that has been ongoing for a few weeks.  Temporized during her last inpatient stay with APC ablation of small bowel AVMs and she developed Mandelbaum stools which quickly turned dark when she got home from that admission.  Progressive anemia leading to blood transfusion last night, she had an appropriate response to 1 unit PRBC.  Reviewed her prior procedures with her and her husband.  She has a high burden of small bowel AVMs where I suspect this is more than likely coming from, however has had colonic AVMs in the past, last colonoscopy 2015 (which did not show any).  Given rising BUN and dark stools again presuming this is upper tract / small bowel and recommend an enteroscopy to reassess her small bowel for recurrent bleeding.  She had stigmata of bleeding on her most recent exam as a suspected source.  We discussed enteroscopy, risks of the procedure including bleeding / perforation, and risks cardiopulmonary problems with anesthesia, and she wanted to proceed.  If this finds a clear source on this exam we will address it.  If it is negative we we will discuss other options which would include colonoscopy or capsule endoscopy.  She has been resuscitated appropriately and hemodynamically stable, we have her tentatively scheduled for enteroscopy later this morning.  Please keep her n.p.o. until that time.  She agreed  Plan: - NPO today - Enteroscopy this morning - trend Hgb - continue protonix  Further recommendations pending enteroscopy results and her course.  Jolly Mango, MD [facility] Gastroenterology

## 2021-04-13 NOTE — Progress Notes (Signed)
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  She was recently hospitalized couple of weeks ago, underwent an EGD and a few AVMs were found status post APC.  She was discharged on a PPI and was doing well up until a few days ago when she noticed melena again.  Subjective / 24h Interval events: She is doing well this morning, had couple bowel movement this morning with persistent melena.  Complains of chronic shortness of breath  Assessment & Plan: Principal Problem Acute blood loss anemia due to upper GI bleed, history of AVMs-received a unit of packed red blood cells overnight, hemoglobin improved to 8.6 this morning.  Continue to closely monitor. -Gastroenterology consulted, it appears that she will undergo an EGD this morning.  Further care per gastroenterology  Active Problems History of scleroderma, PAH, pericardial effusion-all appear stable at this point, at baseline.  Continue home medications  Essential hypertension-continue home medications  Chronic kidney disease stage IIIa-stable, creatinine at baseline  History of PE-not on anticoagulation due to recurrent GI bleeds.  Closely monitor  Scheduled Meds:  atovaquone  750 mg Oral QHS   diltiazem  120 mg Oral Daily   furosemide  20 mg Oral Daily   macitentan  10 mg Oral Daily   montelukast  10 mg Oral QHS   predniSONE  1 mg Oral QID   spironolactone  12.5 mg Oral Daily   Continuous Infusions:  sodium chloride Stopped (04/12/21 1953)   pantoprazole 8 mg/hr (04/12/21 2118)   PRN  Meds:.acetaminophen **OR** acetaminophen, albuterol, ALPRAZolam, fluticasone, ondansetron **OR** ondansetron (ZOFRAN) IV  Diet Orders (From admission, onward)     Start     Ordered   04/13/21 0001  Diet NPO time specified Except for: Sips with Meds  Diet effective midnight       Question:  Except for  Answer:  Sips with Meds   04/12/21 2027            DVT prophylaxis: SCDs Start: 04/12/21 2027     Code Status: Full Code  Family Communication: no family at bedside   Status is: Inpatient  Remains inpatient appropriate because:Inpatient level of care appropriate due to severity of illness  Dispo: The patient is from: Home              Anticipated d/c is to: Home              Patient currently is not medically stable to d/c.   Difficult to place patient No  Level of care: Telemetry  Consultants:  GI  Procedures:  EGD - pending  Microbiology  none  Antimicrobials: none    Objective: Vitals:   04/12/21 2209 04/12/21 2245 04/13/21 0108 04/13/21 0535  BP: (!) 138/50 140/66 (!) 126/54 (!) 132/59  Pulse: 75 81  88  Resp: (!) _0 Temp: 98.4 F (36.9 C) 97.7 F (36.5 C) 97.7 F (36.5 C) 97.9 F (36.6 C)  TempSrc: Oral Oral Oral Oral  SpO2: 94% 100% 97% 94%    Intake/Output Summary (Last 24 hours) at 04/13/2021 4010 Last data filed  at 04/13/2021 0600 Gross per 24 hour  Intake 404 ml  Output --  Net 404 ml   There were no vitals filed for this visit.  Examination:  Constitutional: NAD Eyes: no scleral icterus ENMT: Mucous membranes are moist.  Neck: normal, supple Respiratory: Overall clear without wheezing Cardiovascular: Regular rate and rhythm, no murmurs / rubs / gallops. No LE edema. Abdomen: non distended, no tenderness. Bowel sounds positive.  Musculoskeletal: no clubbing / cyanosis.  Skin: no rashes Neurologic: CN 2-12 grossly intact. Strength 5/5 in all 4.  Psychiatric: Normal judgment and insight. Alert and oriented x 3. Normal  mood.   Data Reviewed: I have independently reviewed following labs and imaging studies   CBC: Recent Labs  Lab 04/09/21 1339 04/12/21 1208 04/12/21 1758 04/13/21 0615  WBC 7.4 7.2 7.2 6.6  NEUTROABS  --   --  5.8  --   HGB 8.4 Repeated and verified X2.* 7.8 Repeated and verified X2.* 7.5* 8.6*  HCT 25.1 Repeated and verified X2.* 23.4 Repeated and verified X2.* 24.3* 26.8*  MCV 98.0 98.3 105.7* 99.3  PLT 263.0 298.0 286 945   Basic Metabolic Panel: Recent Labs  Lab 04/09/21 1339 04/12/21 1208 04/12/21 1758 04/13/21 0615  NA 137 136 136 137  K 4.0 3.7 4.1 3.6  CL 99 97 99 100  CO2 _0 GLUCOSE 101* 131* 105* 81  BUN 43* 44* 45* 39*  CREATININE 1.48* 1.50* 1.54* 1.29*  CALCIUM 9.6 9.2 9.0 8.6*   Liver Function Tests: Recent Labs  Lab 04/09/21 1339 04/12/21 1208 04/12/21 1758  AST _1 ALT _2 ALKPHOS 31* 30* 31*  BILITOT 0.5 0.5 0.6  PROT 6.5 6.3 6.4*  ALBUMIN 4.3 4.2 4.1   Coagulation Profile: Recent Labs  Lab 04/12/21 2045  INR 1.0   HbA1C: No results for input(s): HGBA1C in the last 72 hours. CBG: No results for input(s): GLUCAP in the last 168 hours.  Recent Results (from the past 240 hour(s))  Resp Panel by RT-PCR (Flu A&B, Covid) Nasopharyngeal Swab     Status: None   Collection Time: 04/12/21  5:44 PM   Specimen: Nasopharyngeal Swab; Nasopharyngeal(NP) swabs in vial transport medium  Result Value Ref Range Status   SARS Coronavirus 2 by RT PCR NEGATIVE NEGATIVE Final    Comment: (NOTE) SARS-CoV-2 target nucleic acids are NOT DETECTED.  The SARS-CoV-2 RNA is generally detectable in upper respiratory specimens during the acute phase of infection. The lowest concentration of SARS-CoV-2 viral copies this assay can detect is 138 copies/mL. A negative result does not preclude SARS-Cov-2 infection and should not be used as the sole basis for treatment or other patient management decisions. A negative result may occur with   improper specimen collection/handling, submission of specimen other than nasopharyngeal swab, presence of viral mutation(s) within the areas targeted by this assay, and inadequate number of viral copies(<138 copies/mL). A negative result must be combined with clinical observations, patient history, and epidemiological information. The expected result is Negative.  Fact Sheet for Patients:  EntrepreneurPulse.com.au  Fact Sheet for Healthcare Providers:  IncredibleEmployment.be  This test is no t yet approved or cleared by the [place] FDA and  has been authorized for detection and/or diagnosis of SARS-CoV-2 by FDA under an Emergency Use Authorization (EUA). This EUA will remain  in effect (meaning this test can be used) for the duration of the COVID-19 declaration under Section 564(b)(1) of the Act, 21 U.S.C.section 360bbb-3(b)(1),  unless the authorization is terminated  or revoked sooner.       Influenza A by PCR NEGATIVE NEGATIVE Final   Influenza B by PCR NEGATIVE NEGATIVE Final    Comment: (NOTE) The Xpert Xpress SARS-CoV-2/FLU/RSV plus assay is intended as an aid in the diagnosis of influenza from Nasopharyngeal swab specimens and should not be used as a sole basis for treatment. Nasal washings and aspirates are unacceptable for Xpert Xpress SARS-CoV-2/FLU/RSV testing.  Fact Sheet for Patients: EntrepreneurPulse.com.au  Fact Sheet for Healthcare Providers: IncredibleEmployment.be  This test is not yet approved or cleared by the [place] FDA and has been authorized for detection and/or diagnosis of SARS-CoV-2 by FDA under an Emergency Use Authorization (EUA). This EUA will remain in effect (meaning this test can be used) for the duration of the COVID-19 declaration under Section 564(b)(1) of the Act, 21 U.S.C. section 360bbb-3(b)(1), unless the authorization is terminated  or revoked.  Performed at [facility], [place] [address] [place], [place] [address]      Radiology Studies: No results found.  Marzetta Board, MD, PhD [facility]  Between 7 am - 7 pm I am available, please contact me via Amion (for emergencies) or Securechat (non urgent messages)  Between 7 pm - 7 am I am not available, please contact night coverage MD/APP via Amion

## 2021-04-13 NOTE — Transfer of Care (Signed)
Immediate Anesthesia Transfer of Care Note  Patient: Paula Ward  Procedure(s) Performed: ENTEROSCOPY SUBMUCOSAL TATTOO INJECTION HOT HEMOSTASIS (ARGON PLASMA COAGULATION/BICAP)  Patient Location: PACU  Anesthesia Type:MAC  Level of Consciousness: awake, alert , oriented and patient cooperative  Airway & Oxygen Therapy: Patient Spontanous Breathing and Patient connected to nasal cannula oxygen  Post-op Assessment: Report given to RN and Post -op Vital signs reviewed and stable  Post vital signs: Reviewed and stable  Last Vitals:  Vitals Value Taken Time  BP 140/35 04/13/21 1510  Temp 36.6 C 04/13/21 1509  Pulse 80 04/13/21 1511  Resp 26 04/13/21 1511  SpO2 90 % 04/13/21 1511  Vitals shown include unvalidated device data.  Last Pain:  Vitals:   04/13/21 1509  TempSrc: Axillary  PainSc: 0-No pain         Complications: No notable events documented.

## 2021-04-13 NOTE — Anesthesia Postprocedure Evaluation (Signed)
Anesthesia Post Note  Patient: MARIANELA MANDRELL  Procedure(s) Performed: ENTEROSCOPY SUBMUCOSAL TATTOO INJECTION HOT HEMOSTASIS (ARGON PLASMA COAGULATION/BICAP)     Patient location during evaluation: PACU Anesthesia Type: MAC Level of consciousness: awake and alert Pain management: pain level controlled Vital Signs Assessment: post-procedure vital signs reviewed and stable Respiratory status: spontaneous breathing, nonlabored ventilation and respiratory function stable Cardiovascular status: blood pressure returned to baseline and stable Postop Assessment: no apparent nausea or vomiting Anesthetic complications: no   No notable events documented.  Last Vitals:  Vitals:   04/13/21 1353 04/13/21 1509  BP: (!) 140/52 (!) 124/39  Pulse: 89 81  Resp: 19 (!) 22  Temp: 36.9 C 36.6 C  SpO2: 96% 96%    Last Pain:  Vitals:   04/13/21 1509  TempSrc: Axillary  PainSc: 0-No pain                 Pervis Hocking

## 2021-04-13 NOTE — H&P (View-Only) (Signed)
Consultation  Referring Provider:     Jennette Kettle  Primary Care Physician:  Hoyt Koch, MD Primary Gastroenterologist:       Dr. Silverio Decamp  Reason for Consultation:     anemia, melena         HPI:   Paula Ward is a 85 y.o. 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  The patient was recently admitted on June 10 for an upper GI bleed.  She had endorsed a few days of weakness and new onset of melena leading up to that time.  She was found to have a hemoglobin of 7.1 when her baseline is nines to tens.  She underwent an enteroscopy with Dr. Hilarie Fredrickson on 6/10 showing AVMs in the third portion of the duodenum and proximal jejunum, with heme and stigmata of recent bleeding.  These were treated with APC.  The patient stabilized and was discharged a few days later.  She reports she had a [place] stool upon arriving home but since then had developed recurrent melena.  She has not been taking any iron supplementation outside of a multivitamin.  She denies any NSAID use or blood thinners.  She states she is having hard black stools, a few per day or so.  Her hemoglobin at discharge was 9.0.  On June 13 was 9.4, on June 21 had trended down to 8.4, and yesterday down trended to 7.5.  She feels fatigued again and symptoms are consistent with her prior bleeding that she had before.  I have reviewed her prior endoscopic procedures over the past few years.  She has had multiple AVMs treated in the small intestine over the years.  She had a remote colonoscopy showing AVMs there however last colonoscopy in 2015 reportedly showed no AVMs and severe diverticulosis.  She did have a capsule study done in March 2017 showing suspected AVMs in the jejunum but the procedure was incomplete.  She received 1 unit of blood overnight and her hemoglobin went from 7.5 to 8.6.  Her baseline BUN  seems to be around 30 or so.  Had risen to 35 yesterday with creatinine of 1.5.  He denies any pain.  She denies any cardiopulmonary symptoms that bother her at this time but does have some pulmonary hypertension and interstitial lung disease with some chronic associated respiratory symptoms.  She also has chronic kidney disease. She has been compliant with PPI at home.  Husband is present at the bedside today and we reviewed her history together.   Past Medical History:  Diagnosis Date  . Anemia   . Angiodysplasia of stomach and duodenum   . Arthus phenomenon   . AVM (arteriovenous malformation)   . Blood transfusion without reported diagnosis    last 4 units 12-22-15, Iron infusion x2 last -01-07-16,01-14-16.  . Breast cancer ([place]) 1989   Left  . Candida esophagitis ([place])   . Cataract   . Chronic kidney disease    Chronic mild renal insuffiency  . CREST syndrome ([place])   . GERD (gastroesophageal reflux disease)    w/ HH  . Interstitial lung disease ([place])   . Nodule of kidney   . PONV (postoperative nausea and vomiting)   . Pulmonary embolus ([place]) 2003  . Pulmonary hypertension ([facility])    followed by Dr Gaynell Face at [facility], now Dr. Maryjean Ka visit end 2'17.  . Rectal  incontinence   . Renal cell carcinoma ([place])   . Scleroderma ([place])   . Tubular adenoma of colon   . Uterine polyp     Past Surgical History:  Procedure Laterality Date  . APPENDECTOMY  1962  . BREAST LUMPECTOMY  1989   left  . CATARACT EXTRACTION, BILATERAL Bilateral 12/2013  . ENTEROSCOPY N/A 01/18/2016   Procedure: ENTEROSCOPY;  Surgeon: Mauri Pole, MD;  Location: [facility];  Service: Endoscopy;  Laterality: N/A;  . ENTEROSCOPY N/A 05/24/2018   Procedure: ENTEROSCOPY;  Surgeon: Jackquline Denmark, MD;  Location: [facility];  Service: Endoscopy;  Laterality: N/A;  . ENTEROSCOPY N/A 03/29/2021   Procedure: ENTEROSCOPY;  Surgeon: Jerene Bears, MD;  Location: [facility];  Service: Gastroenterology;   Laterality: N/A;  . ESOPHAGOGASTRODUODENOSCOPY (EGD) WITH PROPOFOL N/A 12/21/2015   Procedure: ESOPHAGOGASTRODUODENOSCOPY (EGD) WITH PROPOFOL;  Surgeon: Irene Shipper, MD;  Location: [facility];  Service: Endoscopy;  Laterality: N/A;  . HOT HEMOSTASIS N/A 05/24/2018   Procedure: HOT HEMOSTASIS (ARGON PLASMA COAGULATION/BICAP);  Surgeon: Jackquline Denmark, MD;  Location: [facility];  Service: Endoscopy;  Laterality: N/A;  . HOT HEMOSTASIS N/A 03/29/2021   Procedure: HOT HEMOSTASIS (ARGON PLASMA COAGULATION/BICAP);  Surgeon: Jerene Bears, MD;  Location: [facility];  Service: Gastroenterology;  Laterality: N/A;  . IR GENERIC HISTORICAL  06/05/2016   IR RADIOLOGIST EVAL & MGMT 06/05/2016 Aletta Edouard, MD [facility] INTERV RAD  . IVC Filter    . KIDNEY SURGERY     left -"laser surgery by Dr. Kathlene Cote- 5 yrs ago" no removal  . SCHLEROTHERAPY  05/24/2018   Procedure: Woodward Ku;  Surgeon: Jackquline Denmark, MD;  Location: [facility];  Service: Endoscopy;;  . TONSILLECTOMY    . TUBAL LIGATION      Family History  Problem Relation Age of Onset  . Bladder Cancer Father   . Diabetes Father   . Prostate cancer Father   . Alzheimer's disease Mother   . Diabetes Sister   . Lung cancer Sister         smoker  . Esophageal cancer Paternal Uncle   . Colon cancer Neg Hx      Social History   Tobacco Use  . Smoking status: Never  . Smokeless tobacco: Never  Vaping Use  . Vaping Use: Never used  Substance Use Topics  . Alcohol use: No  . Drug use: No    Prior to Admission medications   Medication Sig Start Date End Date Taking? Authorizing Provider  acetaminophen (TYLENOL) 325 MG tablet Take 650 mg by mouth every 6 (six) hours as needed for mild pain.    Yes [provider]  albuterol (VENTOLIN HFA) 108 (90 Base) MCG/ACT inhaler INHALE 2 PUFFS INTO THE LUNGS EVERY 6 HOURS AS NEEDED FOR WHEEZING OR SHORTNESS OF BREATH 03/06/20  Yes Hoyt Koch, MD  ALPRAZolam Duanne Moron) 0.25 MG  tablet Take 0.5 tablets (0.125 mg total) by mouth at bedtime as needed. Patient taking differently: Take 0.125 mg by mouth at bedtime as needed for anxiety or sleep. 10/29/20  Yes Hoyt Koch, MD  atovaquone [facility]) 750 MG/5ML suspension Take 750 mg by mouth at bedtime. 07/10/20  Yes [provider]  diltiazem (CARDIZEM CD) 120 MG 24 hr capsule Take 120 mg by mouth daily.   Yes [provider]  fluticasone (FLONASE) 50 MCG/ACT nasal spray INSTILL 1 SPRAY INTO EACH NOSTRIL TWICE A DAY IF NEEDED Patient taking differently: Place 1 spray into both nostrils daily as needed  for allergies. 09/18/20  Yes Hoyt Koch, MD  furosemide (LASIX) 20 MG tablet Take 1 tablet (20 mg total) by mouth daily. 04/03/21  Yes Hoyt Koch, MD  meclizine (ANTIVERT) 12.5 MG tablet Take 1-2 tablets (12.5-25 mg total) by mouth 3 (three) times daily as needed for dizziness. 10/26/17  Yes Burns, Claudina Lick, MD  montelukast (SINGULAIR) 10 MG tablet TAKE 1 TABLET(10 MG) BY MOUTH AT BEDTIME 01/07/21  Yes Hoyt Koch, MD  Mouthwashes (DRY MOUTH MOUTHWASH) LIQD Use as directed 5 mLs in the mouth or throat daily as needed (dry mouth). Biotene 04/01/21  Yes Shelly Coss, MD  Multiple Vitamins-Iron (MULTIVITAMIN/IRON PO) Take 1 tablet by mouth daily.   Yes [provider]  OPSUMIT 10 MG TABS Take 10 mg by mouth daily. 08/16/15  Yes [provider]  pantoprazole (PROTONIX) 40 MG tablet Take 1 tablet (40 mg total) by mouth daily. 04/01/21  Yes Shelly Coss, MD  predniSONE (DELTASONE) 1 MG tablet Take 1 mg by mouth in the morning, at noon, in the evening, and at bedtime.   Yes [provider]  spironolactone (ALDACTONE) 25 MG tablet Take 12.5 mg by mouth daily. 06/09/20  Yes [provider]  [place] test strip  06/04/20   [provider]  sucralfate (CARAFATE) 1 GM/10ML suspension TAKE 10 MLS BY MOUTH AT BEDTIME Patient not taking:  Reported on 04/12/2021 09/17/20 04/12/21  Mauri Pole, MD    Current Facility-Administered Medications  Medication Dose Route Frequency Provider Last Rate Last Admin  . 0.9 %  sodium chloride infusion  10 mL/hr Intravenous Once Little, Wenda Overland, MD   Held at 04/12/21 1953  . acetaminophen (TYLENOL) tablet 650 mg  650 mg Oral Q6H PRN Etta Quill, DO       Or  . acetaminophen (TYLENOL) suppository 650 mg  650 mg Rectal Q6H PRN Etta Quill, DO      . albuterol (PROVENTIL) (2.5 MG/3ML) 0.083% nebulizer solution 2.5 mg  2.5 mg Nebulization Q6H PRN Etta Quill, DO      . ALPRAZolam Duanne Moron) tablet 0.125 mg  0.125 mg Oral QHS PRN Etta Quill, DO      . atovaquone (MEPRON) 750 MG/5ML suspension 750 mg  750 mg Oral QHS Jennette Kettle M, DO   750 mg at 04/13/21 0026  . diltiazem (CARDIZEM CD) 24 hr capsule 120 mg  120 mg Oral Daily Alcario Drought, Jared M, DO      . fluticasone [facility]) 50 MCG/ACT nasal spray 1 spray  1 spray Each Nare Daily PRN Etta Quill, DO      . furosemide (LASIX) tablet 20 mg  20 mg Oral Daily Alcario Drought, Jared M, DO      . macitentan (OPSUMIT) tablet 10 mg  10 mg Oral Daily Jennette Kettle M, DO      . montelukast (SINGULAIR) tablet 10 mg  10 mg Oral QHS Jennette Kettle M, DO   10 mg at 04/13/21 1275  . ondansetron (ZOFRAN) tablet 4 mg  4 mg Oral Q6H PRN Etta Quill, DO       Or  . ondansetron [facility]) injection 4 mg  4 mg Intravenous Q6H PRN Etta Quill, DO      . pantoprozole (PROTONIX) 80 mg /NS 100 mL infusion  8 mg/hr Intravenous Continuous Little, Wenda Overland, MD 10 mL/hr at 04/12/21 2118 8 mg/hr at 04/12/21 2118  . predniSONE (DELTASONE) tablet 1 mg  1 mg  Oral QID Caren Griffins, MD      . spironolactone (ALDACTONE) tablet 12.5 mg  12.5 mg Oral Daily Jennette Kettle M, DO        Allergies as of 04/12/2021 - Review Complete 04/12/2021  Allergen Reaction Noted  . Codeine Nausea Only 08/03/2008  . Other Nausea And Vomiting  04/05/2012  . Erythromycin Nausea And Vomiting 10/17/2014  . Lisinopril  12/14/2014  . Mycophenolate mofetil Nausea Only 09/23/2019  . Sulfa antibiotics Other (See Comments) 03/28/2021     Review of Systems:    As per HPI, otherwise negative    Physical Exam:  Vital signs in last 24 hours: Temp:  [97.7 F (36.5 C)-98.4 F (36.9 C)] 97.9 F (36.6 C) (06/25 0535) Pulse Rate:  [70-90] 88 (06/25 0535) Resp:  [16-24] 16 (06/25 0535) BP: (122-142)/(47-66) 132/59 (06/25 0535) SpO2:  [93 %-100 %] 94 % (06/25 0535) Last BM Date: 04/11/21 General:   Pleasant female in NAD Head:  Normocephalic and atraumatic. Eyes:   No icterus.   Conjunctiva pale Ears:  Normal auditory acuity. Neck:  Supple Lungs:  Respirations even and unlabored. Lungs relatively clear to auscultation bilaterally.    Heart:  Regular rate and rhythm; 2/6 SEM Abdomen:  Soft, nondistended, nontender.  Rectal:  Not performed.  Msk:  Symmetrical without gross deformities.  Extremities:  Without edema. Neurologic:  Alert and  oriented x4;  grossly normal neurologically. Skin:  Intact without significant lesions or rashes. Psych:  Alert and cooperative. Normal affect.  LAB RESULTS: Recent Labs    04/12/21 1208 04/12/21 1758 04/13/21 0615  WBC 7.2 7.2 6.6  HGB 7.8 Repeated and verified X2.* 7.5* 8.6*  HCT 23.4 Repeated and verified X2.* 24.3* 26.8*  PLT 298.0 286 258   BMET Recent Labs    04/12/21 1208 04/12/21 1758 04/13/21 0615  NA 136 136 137  K 3.7 4.1 3.6  CL 97 99 100  CO2 _0 GLUCOSE 131* 105* 81  BUN 44* 45* 39*  CREATININE 1.50* 1.54* 1.29*  CALCIUM 9.2 9.0 8.6*   LFT Recent Labs    04/12/21 1758  PROT 6.4*  ALBUMIN 4.1  AST 22  ALT 15  ALKPHOS 31*  BILITOT 0.6   PT/INR Recent Labs    04/12/21 2045  LABPROT 13.2  INR 1.0    STUDIES: No results found.     Impression / Plan:   85 year old female known to our service for history of AVM related bowel bleeding,  admitted with recurrent melena, anemia, presumably from AVMs in her bowel.  GI bleed - history of crest/scleroderma, multiple AVMs in the bowel Anemia  Unfortunately the patient has had recurrent bleeding with recurrent melena and anemia, sounds like low-grade bleeding that has been ongoing for a few weeks.  Temporized during her last inpatient stay with APC ablation of small bowel AVMs and she developed [place] stools which quickly turned dark when she got home from that admission.  Progressive anemia leading to blood transfusion last night, she had an appropriate response to 1 unit PRBC.  Reviewed her prior procedures with her and her husband.  She has a high burden of small bowel AVMs where I suspect this is more than likely coming from, however has had colonic AVMs in the past, last colonoscopy 2015 (which did not show any).  Given rising BUN and dark stools again presuming this is upper tract / small bowel and recommend an enteroscopy to reassess her small bowel for recurrent bleeding.  She had stigmata of bleeding on her most recent exam as a suspected source.  We discussed enteroscopy, risks of the procedure including bleeding / perforation, and risks cardiopulmonary problems with anesthesia, and she wanted to proceed.  If this finds a clear source on this exam we will address it.  If it is negative we we will discuss other options which would include colonoscopy or capsule endoscopy.  She has been resuscitated appropriately and hemodynamically stable, we have her tentatively scheduled for enteroscopy later this morning.  Please keep her n.p.o. until that time.  She agreed  Plan: - NPO today - Enteroscopy this morning - trend Hgb - continue protonix  Further recommendations pending enteroscopy results and her course.  Jolly Mango, MD [facility]enter[facility]

## 2021-04-13 NOTE — ED Provider Notes (Signed)
[facility] Provider Note   CSN: [id number] Arrival date & time: 04/12/21  1646     History Chief Complaint  Patient presents with   Rectal Bleeding   Abnormal Lab    Paula Ward is a 85 y.o. female.  85yo F w/ PMH including RCC, CKD, PE, pulm HTN, AVMs, angiodysplasia of stomach and small intestine who p/w melena. PT was hospitalized recently for GI bleeding and when she went home, bleeding had stopped. Several days ago, she began having dark black stools again and has continued to have dark stools daily. She denies any associated abdominal pain, diarrhea, or vomiting. She has some lightheadedness when she stands and walks around. She denies CP or SOB.   The history is provided by the patient.  Rectal Bleeding Abnormal Lab     Past Medical History:  Diagnosis Date   Anemia    Angiodysplasia of stomach and duodenum    Arthus phenomenon    AVM (arteriovenous malformation)    Blood transfusion without reported diagnosis    last 4 units 12-22-15, Iron infusion x2 last -01-07-16,01-14-16.   Breast cancer ([place]) 1989   Left   Candida esophagitis (HCC)    Cataract    Chronic kidney disease    Chronic mild renal insuffiency   CREST syndrome (HCC)    GERD (gastroesophageal reflux disease)    w/ HH   Interstitial lung disease (HCC)    Nodule of kidney    PONV (postoperative nausea and vomiting)    Pulmonary embolus ([place]) 2003   Pulmonary hypertension ([place])    followed by Dr Gaynell Face at [facility], now Dr. Maryjean Ka visit end 2'17.   Rectal incontinence    Renal cell carcinoma (HCC)    Scleroderma (HCC)    Tubular adenoma of colon    Uterine polyp     Patient Active Problem List   Diagnosis Date Noted   Acute on chronic blood loss anemia 04/12/2021   Generalized weakness 03/29/2021   Acute on chronic diastolic CHF (congestive heart failure) ([place]) 03/29/2021   Elevated troponin 03/29/2021   GI bleed 03/29/2021   Intestinal  angiodysplasia    Acute upper GI bleed 03/28/2021   SOB (shortness of breath) on exertion 03/08/2021   Anemia, chronic disease 12/25/2020   Fatigue 12/25/2020   Hoarseness 07/24/2020   Drowsiness 12/17/2018   Leg swelling 09/03/2018   Orthopnea 06/24/2018   Dry mouth 02/15/2018   Allergic rhinitis 11/05/2017   Cerebellar 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 ([place]) 10/26/2017   Routine general medical examination at a health care facility 01/30/2017   Community acquired pneumonia 08/19/2016   Pericardial effusion 01/21/2016   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[place])    Osteoarthritis 02/25/2015   Mass of left side of neck    CKD (chronic kidney disease) stage 3, GFR 30-59 ml/min (HCC) 09/01/2012   RECTAL INCONTINENCE 03/27/2009   Angiodysplasia of stomach and duodenum 08/22/2008   ADENOCARCINOMA, BREAST, LEFT 07/26/2007   Anemia, iron deficiency 07/26/2007   Essential hypertension 07/26/2007   Pulmonary hypertension ([place]) 07/26/2007   GASTROESOPHAGEAL REFLUX DISEASE 07/26/2007   SCLERODERMA 07/26/2007   ILD (interstitial lung disease) ([place]) 07/26/2007   PULMONARY EMBOLISM, HX OF 07/26/2007    Past Surgical History:  Procedure Laterality Date   APPENDECTOMY  1962   BREAST LUMPECTOMY  1989   left   CATARACT EXTRACTION, BILATERAL Bilateral 12/2013   ENTEROSCOPY N/A 01/18/2016   Procedure: ENTEROSCOPY;  Surgeon: Harl Bowie  V, MD;  Location: [facility];  Service: Endoscopy;  Laterality: N/A;   ENTEROSCOPY N/A 05/24/2018   Procedure: ENTEROSCOPY;  Surgeon: Jackquline Denmark, MD;  Location: [facility];  Service: Endoscopy;  Laterality: N/A;   ENTEROSCOPY N/A 03/29/2021   Procedure: ENTEROSCOPY;  Surgeon: Jerene Bears, MD;  Location: [facility];  Service: Gastroenterology;  Laterality: N/A;   ESOPHAGOGASTRODUODENOSCOPY (EGD) WITH PROPOFOL N/A 12/21/2015   Procedure: ESOPHAGOGASTRODUODENOSCOPY (EGD) WITH PROPOFOL;  Surgeon: Irene Shipper, MD;  Location: [facility];  Service:  Endoscopy;  Laterality: N/A;   HOT HEMOSTASIS N/A 05/24/2018   Procedure: HOT HEMOSTASIS (ARGON PLASMA COAGULATION/BICAP);  Surgeon: Jackquline Denmark, MD;  Location: [facility];  Service: Endoscopy;  Laterality: N/A;   HOT HEMOSTASIS N/A 03/29/2021   Procedure: HOT HEMOSTASIS (ARGON PLASMA COAGULATION/BICAP);  Surgeon: Jerene Bears, MD;  Location: [facility];  Service: Gastroenterology;  Laterality: N/A;   IR GENERIC HISTORICAL  06/05/2016   IR RADIOLOGIST EVAL & MGMT 06/05/2016 Aletta Edouard, MD [facility] INTERV RAD   IVC Filter     KIDNEY SURGERY     left -"laser surgery by Dr. Kathlene Cote- 5 yrs ago" no removal   SCHLEROTHERAPY  05/24/2018   Procedure: SCHLEROTHERAPY;  Surgeon: Jackquline Denmark, MD;  Location: [facility];  Service: Endoscopy;;   TONSILLECTOMY     TUBAL LIGATION       OB History   No obstetric history on file.     Family History  Problem Relation Age of Onset   Bladder Cancer Father    Diabetes Father    Prostate cancer Father    Alzheimer's disease Mother    Diabetes Sister    Lung cancer Sister         smoker   Esophageal cancer Paternal Uncle    Colon cancer Neg Hx     Social History   Tobacco Use   Smoking status: Never   Smokeless tobacco: Never  Vaping Use   Vaping Use: Never used  Substance Use Topics   Alcohol use: No   Drug use: No    Home Medications Prior to Admission medications   Medication Sig Start Date End Date Taking? Authorizing Provider  acetaminophen (TYLENOL) 325 MG tablet Take 650 mg by mouth every 6 (six) hours as needed for mild pain.    Yes [provider]  albuterol (VENTOLIN HFA) 108 (90 Base) MCG/ACT inhaler INHALE 2 PUFFS INTO THE LUNGS EVERY 6 HOURS AS NEEDED FOR WHEEZING OR SHORTNESS OF BREATH 03/06/20  Yes Hoyt Koch, MD  ALPRAZolam Duanne Moron) 0.25 MG tablet Take 0.5 tablets (0.125 mg total) by mouth at bedtime as needed. Patient taking differently: Take 0.125 mg by mouth at bedtime as needed for anxiety or  sleep. 10/29/20  Yes Hoyt Koch, MD  atovaquone [facility]) 750 MG/5ML suspension Take 750 mg by mouth at bedtime. 07/10/20  Yes [provider]  diltiazem (CARDIZEM CD) 120 MG 24 hr capsule Take 120 mg by mouth daily.   Yes [provider]  fluticasone (FLONASE) 50 MCG/ACT nasal spray INSTILL 1 SPRAY INTO EACH NOSTRIL TWICE A DAY IF NEEDED Patient taking differently: Place 1 spray into both nostrils daily as needed for allergies. 09/18/20  Yes Hoyt Koch, MD  furosemide (LASIX) 20 MG tablet Take 1 tablet (20 mg total) by mouth daily. 04/03/21  Yes Hoyt Koch, MD  meclizine (ANTIVERT) 12.5 MG tablet Take 1-2 tablets (12.5-25 mg total) by mouth 3 (three) times daily as needed for dizziness. 10/26/17  Yes Binnie Rail, MD  montelukast (SINGULAIR) 10 MG tablet TAKE 1 TABLET(10 MG) BY MOUTH AT BEDTIME 01/07/21  Yes Hoyt Koch, MD  Mouthwashes (DRY MOUTH MOUTHWASH) LIQD Use as directed 5 mLs in the mouth or throat daily as needed (dry mouth). Biotene 04/01/21  Yes Shelly Coss, MD  Multiple Vitamins-Iron (MULTIVITAMIN/IRON PO) Take 1 tablet by mouth daily.   Yes [provider]  OPSUMIT 10 MG TABS Take 10 mg by mouth daily. 08/16/15  Yes [provider]  pantoprazole (PROTONIX) 40 MG tablet Take 1 tablet (40 mg total) by mouth daily. 04/01/21  Yes Shelly Coss, MD  predniSONE (DELTASONE) 1 MG tablet Take 1 mg by mouth in the morning, at noon, in the evening, and at bedtime.   Yes [provider]  spironolactone (ALDACTONE) 25 MG tablet Take 12.5 mg by mouth daily. 06/09/20  Yes [provider]  [place] test strip  06/04/20   [provider]  sucralfate (CARAFATE) 1 GM/10ML suspension TAKE 10 MLS BY MOUTH AT BEDTIME Patient not taking: Reported on 04/12/2021 09/17/20 04/12/21  Mauri Pole, MD    Allergies    Codeine, Other, Erythromycin, Lisinopril, Mycophenolate mofetil, and Sulfa  antibiotics  Review of Systems   Review of Systems  Gastrointestinal:  Positive for hematochezia.  All other systems reviewed and are negative except that which was mentioned in HPI  Physical Exam Updated Vital Signs BP 140/66   Pulse 81   Temp 97.7 F (36.5 C) (Oral)   Resp 18   SpO2 100%   Physical Exam Vitals and nursing note reviewed.  Constitutional:      General: She is not in acute distress.    Appearance: Normal appearance.  HENT:     Head: Normocephalic and atraumatic.  Eyes:     Conjunctiva/sclera: Conjunctivae normal.  Cardiovascular:     Rate and Rhythm: Normal rate and regular rhythm.     Heart sounds: Normal heart sounds.  Pulmonary:     Effort: Pulmonary effort is normal.     Breath sounds: Normal breath sounds.  Abdominal:     General: Abdomen is flat. Bowel sounds are normal. There is no distension.     Palpations: Abdomen is soft.     Tenderness: There is no abdominal tenderness.  Musculoskeletal:     Right lower leg: No edema.     Left lower leg: No edema.  Skin:    General: Skin is warm and dry.  Neurological:     Mental Status: She is alert and oriented to person, place, and time.     Comments: fluent  Psychiatric:        Mood and Affect: Mood normal.        Behavior: Behavior normal.    ED Results / Procedures / Treatments   Labs (all labs ordered are listed, but only abnormal results are displayed) Labs Reviewed  COMPREHENSIVE METABOLIC PANEL - Abnormal; Notable for the following components:      Result Value   Glucose, Bld 105 (*)    BUN 45 (*)    Creatinine, Ser 1.54 (*)    Total Protein 6.4 (*)    Alkaline Phosphatase 31 (*)    GFR, Estimated 34 (*)    All other components within normal limits  CBC WITH DIFFERENTIAL/PLATELET - Abnormal; Notable for the following components:   RBC 2.30 (*)    Hemoglobin 7.5 (*)    HCT 24.3 (*)    MCV 105.7 (*)  RDW 18.2 (*)    All other components within normal limits  RESP PANEL BY RT-PCR  (FLU A&B, COVID) ARPGX2  PROTIME-INR  CBC  BASIC METABOLIC PANEL  POC OCCULT BLOOD, ED  TYPE AND SCREEN  PREPARE RBC (CROSSMATCH)    EKG None  Radiology No results found.  Procedures .Critical Care  Date/Time: 04/13/2021 12:10 AM Performed by: Sharlett Iles, MD Authorized by: Sharlett Iles, MD   Critical care provider statement:    Critical care time (minutes):  30   Critical care time was exclusive of:  Separately billable procedures and treating other patients   Critical care was necessary to treat or prevent imminent or life-threatening deterioration of the following conditions: GI bleed.   Critical care was time spent personally by me on the following activities:  Development of treatment plan with patient or surrogate, discussions with consultants, examination of patient, obtaining history from patient or surrogate, ordering and performing treatments and interventions, ordering and review of laboratory studies and review of old charts   Medications Ordered in ED Medications  0.9 %  sodium chloride infusion (0 mL/hr Intravenous Hold 04/12/21 1953)  pantoprozole (PROTONIX) 80 mg /NS 100 mL infusion (8 mg/hr Intravenous New Bag/Given 04/12/21 2118)  acetaminophen (TYLENOL) tablet 650 mg (has no administration in time range)    Or  acetaminophen (TYLENOL) suppository 650 mg (has no administration in time range)  ondansetron (ZOFRAN) tablet 4 mg (has no administration in time range)    Or  ondansetron (ZOFRAN) injection 4 mg (has no administration in time range)  montelukast (SINGULAIR) tablet 10 mg (has no administration in time range)  predniSONE (DELTASONE) tablet 1 mg (has no administration in time range)  spironolactone (ALDACTONE) tablet 12.5 mg (has no administration in time range)  furosemide (LASIX) tablet 20 mg (has no administration in time range)  diltiazem (CARDIZEM CD) 24 hr capsule 120 mg (has no administration in time range)  fluticasone  (FLONASE) 50 MCG/ACT nasal spray 1 spray (has no administration in time range)  atovaquone (MEPRON) 750 MG/5ML suspension 750 mg (has no administration in time range)  ALPRAZolam (XANAX) tablet 0.125 mg (has no administration in time range)  albuterol (PROVENTIL) (2.5 MG/3ML) 0.083% nebulizer solution 2.5 mg (has no administration in time range)  macitentan (OPSUMIT) tablet 10 mg (has no administration in time range)  pantoprazole (PROTONIX) 80 mg /NS 100 mL IVPB (0 mg Intravenous Stopped 04/12/21 2117)    ED Course  I have reviewed the triage vital signs and the nursing notes.  Pertinent labs & imaging results that were available during my care of the patient were reviewed by me and considered in my medical decision making (see chart for details).    MDM Rules/Calculators/A&P                          PT well appearing w/ stable VS on exam. No abd tenderness. Hgb 7.5, dropped from 9 at hospital d/c. BUN 45, Cr 1.54.  Given recent endoscopy findings, I do not feel that she needs CT imaging at this time.  Discussed w/ [facility], Dr. Havery Moros. He recommended 1 u pRBC transfusion, IV protonix, and NPO at [facility] for possible endoscopy tomorrow. Pt consented for transfusion. Discussed admission w/ [facility], Dr. Alcario Drought.  Final Clinical Impression(s) / ED Diagnoses Final diagnoses:  Upper GI bleed    Rx / DC Orders ED Discharge Orders     None  Syvanna Ciolino, Wenda Overland, MD 04/13/21 [phone]

## 2021-04-14 DIAGNOSIS — D62 Acute posthemorrhagic anemia: Secondary | ICD-10-CM

## 2021-04-14 DIAGNOSIS — K31819 Angiodysplasia of stomach and duodenum without bleeding: Secondary | ICD-10-CM

## 2021-04-14 LAB — BASIC METABOLIC PANEL
Anion gap: 7 (ref 5–15)
BUN: 29 mg/dL — ABNORMAL HIGH (ref 8–23)
CO2: 30 mmol/L (ref 22–32)
Calcium: 8.8 mg/dL — ABNORMAL LOW (ref 8.9–10.3)
Chloride: 100 mmol/L (ref 98–111)
Creatinine, Ser: 1.23 mg/dL — ABNORMAL HIGH (ref 0.44–1.00)
GFR, Estimated: 44 mL/min — ABNORMAL LOW (ref >60)
Glucose, Bld: 91 mg/dL (ref 70–99)
Potassium: 4.3 mmol/L (ref 3.5–5.1)
Sodium: 137 mmol/L (ref 135–145)

## 2021-04-14 LAB — CBC
HCT: 29.1 % — ABNORMAL LOW (ref 36.0–46.0)
Hemoglobin: 9.4 g/dL — ABNORMAL LOW (ref 12.0–15.0)
MCH: 32.2 pg (ref 26.0–34.0)
MCHC: 32.3 g/dL (ref 30.0–36.0)
MCV: 99.7 fL (ref 80.0–100.0)
Platelets: 275 10*3/uL (ref 150–400)
RBC: 2.92 MIL/uL — ABNORMAL LOW (ref 3.87–5.11)
RDW: 18.8 % — ABNORMAL HIGH (ref 11.5–15.5)
WBC: 11.1 10*3/uL — ABNORMAL HIGH (ref 4.0–10.5)
nRBC: 0 % (ref 0.0–0.2)

## 2021-04-14 MED ORDER — METOCLOPRAMIDE HCL 5 MG/ML IJ SOLN
10.0000 mg | Freq: Once | INTRAMUSCULAR | Status: AC
  Administered 2021-04-15: 10 mg via INTRAVENOUS
  Filled 2021-04-14: qty 2

## 2021-04-14 MED ORDER — METOCLOPRAMIDE HCL 5 MG/ML IJ SOLN
10.0000 mg | Freq: Once | INTRAMUSCULAR | Status: DC
  Filled 2021-04-14: qty 2

## 2021-04-14 MED ORDER — PEG-KCL-NACL-NASULF-NA ASC-C 100 G PO SOLR
0.5000 | Freq: Once | ORAL | Status: AC
  Administered 2021-04-14: 100 g via ORAL
  Filled 2021-04-14: qty 1

## 2021-04-14 MED ORDER — PEG-KCL-NACL-NASULF-NA ASC-C 100 G PO SOLR
0.5000 | Freq: Once | ORAL | Status: AC
  Administered 2021-04-15: 100 g via ORAL

## 2021-04-14 MED ORDER — BISACODYL 5 MG PO TBEC
20.0000 mg | DELAYED_RELEASE_TABLET | Freq: Once | ORAL | Status: AC
  Administered 2021-04-14: 20 mg via ORAL
  Filled 2021-04-14: qty 4

## 2021-04-14 NOTE — Progress Notes (Signed)
   Patient Name: Paula Ward Date of Encounter: 04/14/2021, 1:07 PM    Subjective  Feeling okay today.  No pain.  Last melena   Objective  BP 121/62 (BP Location: Right Arm)   Pulse 92   Temp 98.2 F (36.8 C) (Oral)   Resp 16   Ht 5' 3" (1.6 m)   Wt 48.7 kg   SpO2 94%   BMI 19.02 kg/m  Thin elderly frail white woman looking chronically ill and pale Lungs show bibasilar crackles that improved with respiration otherwise clear Heart sounds distant S1-S2 Abdomen scaphoid CBC Latest Ref Rng & Units 04/14/2021 04/13/2021 04/12/2021  WBC 4.0 - 10.5 K/uL 11.1(H) 6.6 7.2  Hemoglobin 12.0 - 15.0 g/dL 9.4(L) 8.6(L) 7.5(L)  Hematocrit 36.0 - 46.0 % 29.1(L) 26.8(L) 24.3(L)  Platelets 150 - 400 K/uL 275 258 286       Assessment and Plan  Blood loss anemia chronic/acute in the setting of crest syndrome and known angiodysplasia of the GI tract More AVMs ablated yesterday in the stomach and upper intestine at the enteroscopy by Dr. Armbruster  Question if there is a significant colon lesion contributing.  History of AVM in the past, last colonoscopy 2015  For completeness it is reasonable to pursue a colonoscopy so we will do that tomorrow.  Even if there is a lesion there unfortunately she will likely continue to struggle with chronic GI blood loss due to the diffuse angiodysplasia.  I do not think further work-up would make sense after the colonoscopy based upon what I know.  We do have the capability of balloon enteroscopy to get deeper into the small intestine but with the diffuse nature of her AVMs I doubt that would be worth it.  I have allowed her to have a soft breakfast such as eggs and yogurt today that should not interfere with her colonoscopy preparation prior to an early afternoon colonoscopy with Dr. Jacobs tomorrow.  The risks and benefits as well as alternatives of endoscopic procedure(s) have been discussed and reviewed. All questions answered. The patient agrees to  proceed.   Lainee Lehrman E. Dann Galicia, MD, FACG [facility] 04/14/2021 1:07 PM   

## 2021-04-14 NOTE — H&P (View-Only) (Signed)
   Patient Name: Paula Ward Date of Encounter: 04/14/2021, 1:07 PM    Subjective  Feeling okay today.  No pain.  Last melena   Objective  BP 121/62 (BP Location: Right Arm)   Pulse 92   Temp 98.2 F (36.8 C) (Oral)   Resp 16   Ht _0  (1.6 m)   Wt 48.7 kg   SpO2 94%   BMI 19.02 kg/m  Thin elderly frail white woman looking chronically ill and pale Lungs show bibasilar crackles that improved with respiration otherwise clear Heart sounds distant S1-S2 Abdomen scaphoid CBC Latest Ref Rng & Units 04/14/2021 04/13/2021 04/12/2021  WBC 4.0 - 10.5 K/uL 11.1(H) 6.6 7.2  Hemoglobin 12.0 - 15.0 g/dL 9.4(L) 8.6(L) 7.5(L)  Hematocrit 36.0 - 46.0 % 29.1(L) 26.8(L) 24.3(L)  Platelets 150 - 400 K/uL 275 258 286       Assessment and Plan  Blood loss anemia chronic/acute in the setting of crest syndrome and known angiodysplasia of the GI tract More AVMs ablated yesterday in the stomach and upper intestine at the enteroscopy by Dr. Havery Moros  Question if there is a significant colon lesion contributing.  History of AVM in the past, last colonoscopy 2015  For completeness it is reasonable to pursue a colonoscopy so we will do that tomorrow.  Even if there is a lesion there unfortunately she will likely continue to struggle with chronic GI blood loss due to the diffuse angiodysplasia.  I do not think further work-up would make sense after the colonoscopy based upon what I know.  We do have the capability of balloon enteroscopy to get deeper into the small intestine but with the diffuse nature of her AVMs I doubt that would be worth it.  I have allowed her to have a soft breakfast such as eggs and yogurt today that should not interfere with her colonoscopy preparation prior to an early afternoon colonoscopy with Dr. Ardis Hughs tomorrow.  The risks and benefits as well as alternatives of endoscopic procedure(s) have been discussed and reviewed. All questions answered. The patient agrees to  proceed.   Gatha Mayer, MD, [facility] 04/14/2021 1:07 PM

## 2021-04-14 NOTE — Progress Notes (Signed)
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  She was recently hospitalized couple of weeks ago, underwent an EGD and a few AVMs were found status post APC.  She was discharged on a PPI and was doing well up until a few days ago when she noticed melena again.  Subjective / 24h Interval events: Her stools are lightening up, less melena.  No shortness of breath, no lightheadedness, no dizziness  Assessment & Plan: Principal Problem Acute blood loss anemia due to upper GI bleed, history of AVMs-received a unit of packed red blood cells overnight, hemoglobin improved to 8.6 this morning.  Continue to closely monitor. -Gastroenterology consulted, underwent an EGD 6/25 with multiple AVMs status post APC -Colonoscopy tomorrow  Active Problems History of scleroderma, PAH, pericardial effusion-all appear stable at this point, at baseline.  Continue home medications  Essential hypertension-continue home medications  Chronic kidney disease stage IIIa-stable, creatinine at baseline  History of PE-not on anticoagulation due to recurrent GI bleeds.  Closely monitor  Scheduled Meds:  atovaquone  750 mg Oral QHS   bisacodyl  20 mg Oral Once   diltiazem  120 mg Oral Daily   furosemide  20 mg Oral Daily   macitentan  10 mg Oral Daily   montelukast  10 mg Oral QHS   predniSONE  1 mg Oral QID   spironolactone  12.5 mg Oral Daily   Continuous Infusions:  sodium chloride Stopped (04/12/21 1953)   pantoprazole 8 mg/hr (04/13/21 2304)   PRN Meds:.acetaminophen **OR**  acetaminophen, albuterol, ALPRAZolam, diphenhydrAMINE, fluticasone, ondansetron **OR** ondansetron (ZOFRAN) IV  Diet Orders (From admission, onward)     Start     Ordered   04/14/21 1000  Diet clear liquid Room service appropriate? Yes; Fluid consistency: Thin  Diet effective 1000       Question Answer Comment  Room service appropriate? Yes   Fluid consistency: Thin      04/14/21 0739            DVT prophylaxis: SCDs Start: 04/12/21 2027     Code Status: Full Code  Family Communication: no family at bedside   Status is: Inpatient  Remains inpatient appropriate because:Inpatient level of care appropriate due to severity of illness  Dispo: The patient is from: Home              Anticipated d/c is to: Home              Patient currently is not medically stable to d/c.   Difficult to place patient No  Level of care: Telemetry  Consultants:  GI  Procedures:  EGD - 6/25 Impression:               - Normal esophagus.                           - Small hiatal hernia.                           - A single non-bleeding angiodysplastic lesion in  the stomach. Treated with argon plasma coagulation                           (APC).                           - Multiple non-bleeding angiodysplastic lesions in                           the duodenum. Treated with argon plasma coagulation                           (APC).                           - Multiple non-bleeding angiodysplastic lesions in                           the duodenum. Treated with argon plasma coagulation                           (APC).                           - Distant extent reached of proximal jejunum was                           tattooed.                           Overall, no active bleeding but numerous AVMs                           noted, and suspect she likely has additional AVMs                           in the further distal small bowel and possibly                            colon. Another 8 AVMs treated in the proximal small                           bowel on this exam and single small AVM in the                           stomach.   Microbiology  none  Antimicrobials: none    Objective: Vitals:   04/13/21 1530 04/13/21 1552 04/13/21 2026 04/14/21 0510  BP: (!) 158/47 (!) 166/54 (!) 128/59 132/63  Pulse: 74 71 90 92  Resp: (!) _0 Temp:  97.6 F (36.4 C) 98.5 F (36.9 C) 99.4 F (37.4 C)  TempSrc:  Oral Oral Oral  SpO2: 96% 95% 95% 94%  Weight:      Height:        Intake/Output Summary (Last 24 hours) at 04/14/2021 1004 Last data filed at 04/14/2021 0700 Gross per 24 hour  Intake 1209.33 ml  Output 400 ml  Net 809.33 ml    Danley Danker  Weights   04/13/21 1353  Weight: 48.7 kg    Examination:  Constitutional: No distress, in bed Eyes: No scleral icterus ENMT: mmm Neck: normal, supple Respiratory: Distant Velcro type sounds at the bases, no wheezing Cardiovascular: Regular rate and rhythm, 3/6 SEM, no peripheral edema Abdomen: Soft, NT, ND, bowel sounds positive Musculoskeletal: no clubbing / cyanosis.  Skin: No rashes seen Neurologic: non focal  Data Reviewed: I have independently reviewed following labs and imaging studies   CBC: Recent Labs  Lab 04/09/21 1339 04/12/21 1208 04/12/21 1758 04/13/21 0615 04/14/21 0533  WBC 7.4 7.2 7.2 6.6 11.1*  NEUTROABS  --   --  5.8  --   --   HGB 8.4 Repeated and verified X2.* 7.8 Repeated and verified X2.* 7.5* 8.6* 9.4*  HCT 25.1 Repeated and verified X2.* 23.4 Repeated and verified X2.* 24.3* 26.8* 29.1*  MCV 98.0 98.3 105.7* 99.3 99.7  PLT 263.0 298.0 286 258 518    Basic Metabolic Panel: Recent Labs  Lab 04/09/21 1339 04/12/21 1208 04/12/21 1758 04/13/21 0615 04/14/21 0533  NA 137 136 136 137 137  K 4.0 3.7 4.1 3.6 4.3  CL 99 97 99 100 100  CO2 _0 GLUCOSE 101* 131* 105* 81 91  BUN 43* 44* 45* 39* 29*  CREATININE 1.48* 1.50* 1.54* 1.29* 1.23*   CALCIUM 9.6 9.2 9.0 8.6* 8.8*    Liver Function Tests: Recent Labs  Lab 04/09/21 1339 04/12/21 1208 04/12/21 1758  AST _1 ALT _2 ALKPHOS 31* 30* 31*  BILITOT 0.5 0.5 0.6  PROT 6.5 6.3 6.4*  ALBUMIN 4.3 4.2 4.1    Coagulation Profile: Recent Labs  Lab 04/12/21 2045  INR 1.0    HbA1C: No results for input(s): HGBA1C in the last 72 hours. CBG: No results for input(s): GLUCAP in the last 168 hours.  Recent Results (from the past 240 hour(s))  Resp Panel by RT-PCR (Flu A&B, Covid) Nasopharyngeal Swab     Status: None   Collection Time: 04/12/21  5:44 PM   Specimen: Nasopharyngeal Swab; Nasopharyngeal(NP) swabs in vial transport medium  Result Value Ref Range Status   SARS Coronavirus 2 by RT PCR NEGATIVE NEGATIVE Final    Comment: (NOTE) SARS-CoV-2 target nucleic acids are NOT DETECTED.  The SARS-CoV-2 RNA is generally detectable in upper respiratory specimens during the acute phase of infection. The lowest concentration of SARS-CoV-2 viral copies this assay can detect is 138 copies/mL. A negative result does not preclude SARS-Cov-2 infection and should not be used as the sole basis for treatment or other patient management decisions. A negative result may occur with  improper specimen collection/handling, submission of specimen other than nasopharyngeal swab, presence of viral mutation(s) within the areas targeted by this assay, and inadequate number of viral copies(<138 copies/mL). A negative result must be combined with clinical observations, patient history, and epidemiological information. The expected result is Negative.  Fact Sheet for Patients:  EntrepreneurPulse.com.au  Fact Sheet for Healthcare Providers:  IncredibleEmployment.be  This test is no t yet approved or cleared by the [place] FDA and  has been authorized for detection and/or diagnosis of SARS-CoV-2 by FDA under an Emergency Use  Authorization (EUA). This EUA will remain  in effect (meaning this test can be used) for the duration of the COVID-19 declaration under Section 564(b)(1) of the Act, 21 U.S.C.section 360bbb-3(b)(1), unless the authorization is terminated  or revoked sooner.  Influenza A by PCR NEGATIVE NEGATIVE Final   Influenza B by PCR NEGATIVE NEGATIVE Final    Comment: (NOTE) The Xpert Xpress SARS-CoV-2/FLU/RSV plus assay is intended as an aid in the diagnosis of influenza from Nasopharyngeal swab specimens and should not be used as a sole basis for treatment. Nasal washings and aspirates are unacceptable for Xpert Xpress SARS-CoV-2/FLU/RSV testing.  Fact Sheet for Patients: EntrepreneurPulse.com.au  Fact Sheet for Healthcare Providers: IncredibleEmployment.be  This test is not yet approved or cleared by the [place] FDA and has been authorized for detection and/or diagnosis of SARS-CoV-2 by FDA under an Emergency Use Authorization (EUA). This EUA will remain in effect (meaning this test can be used) for the duration of the COVID-19 declaration under Section 564(b)(1) of the Act, 21 U.S.C. section 360bbb-3(b)(1), unless the authorization is terminated or revoked.  Performed at [facility], [place] [address], [place], [place] [address]       Radiology Studies: No results found.  Marzetta Board, MD, PhD [facility]  Between 7 am - 7 pm I am available, please contact me via Amion (for emergencies) or Securechat (non urgent messages)  Between 7 pm - 7 am I am not available, please contact night coverage MD/APP via Amion

## 2021-04-15 ENCOUNTER — Inpatient Hospital Stay (HOSPITAL_COMMUNITY): Payer: Medicare PPO | Admitting: Certified Registered"

## 2021-04-15 ENCOUNTER — Encounter (HOSPITAL_COMMUNITY)
Admission: EM | Disposition: A | Discharge status: Home / Self Care | Payer: Self-pay | Source: Home / Self Care | Attending: Internal Medicine

## 2021-04-15 ENCOUNTER — Encounter (HOSPITAL_COMMUNITY): Payer: Self-pay | Admitting: Internal Medicine

## 2021-04-15 DIAGNOSIS — K921 Melena: Secondary | ICD-10-CM

## 2021-04-15 HISTORY — PX: COLONOSCOPY WITH PROPOFOL: SHX5780

## 2021-04-15 LAB — CBC
HCT: 31.3 % — ABNORMAL LOW (ref 36.0–46.0)
Hemoglobin: 9.9 g/dL — ABNORMAL LOW (ref 12.0–15.0)
MCH: 32 pg (ref 26.0–34.0)
MCHC: 31.6 g/dL (ref 30.0–36.0)
MCV: 101.3 fL — ABNORMAL HIGH (ref 80.0–100.0)
Platelets: 302 10*3/uL (ref 150–400)
RBC: 3.09 MIL/uL — ABNORMAL LOW (ref 3.87–5.11)
RDW: 18.4 % — ABNORMAL HIGH (ref 11.5–15.5)
WBC: 6.5 10*3/uL (ref 4.0–10.5)
nRBC: 0 % (ref 0.0–0.2)

## 2021-04-15 LAB — BASIC METABOLIC PANEL
Anion gap: 11 (ref 5–15)
BUN: 30 mg/dL — ABNORMAL HIGH (ref 8–23)
CO2: 28 mmol/L (ref 22–32)
Calcium: 9 mg/dL (ref 8.9–10.3)
Chloride: 102 mmol/L (ref 98–111)
Creatinine, Ser: 1.28 mg/dL — ABNORMAL HIGH (ref 0.44–1.00)
GFR, Estimated: 42 mL/min — ABNORMAL LOW (ref >60)
Glucose, Bld: 87 mg/dL (ref 70–99)
Potassium: 3.7 mmol/L (ref 3.5–5.1)
Sodium: 141 mmol/L (ref 135–145)

## 2021-04-15 SURGERY — COLONOSCOPY WITH PROPOFOL
Anesthesia: Monitor Anesthesia Care

## 2021-04-15 MED ORDER — LIDOCAINE 2% (20 MG/ML) 5 ML SYRINGE
INTRAMUSCULAR | Status: DC | PRN
  Administered 2021-04-15: 40 mg via INTRAVENOUS

## 2021-04-15 MED ORDER — PROPOFOL 500 MG/50ML IV EMUL
INTRAVENOUS | Status: DC | PRN
  Administered 2021-04-15: 200 ug/kg/min via INTRAVENOUS

## 2021-04-15 MED ORDER — LACTATED RINGERS IV SOLN: INTRAVENOUS | Status: DC | PRN

## 2021-04-15 SURGICAL SUPPLY — 22 items

## 2021-04-15 NOTE — Op Note (Signed)
[facility] Patient Name: Paula Ward Procedure Date: 04/15/2021 MRN: [id number] Attending MD: Milus Banister , MD Date of Birth: 1936/04/23 CSN: [id number] Age: 85 Admit Type: Inpatient Procedure:                Colonoscopy Indications:              Melena, numerous SB AVMs treated this admission;                            last colonoscopy 2015 Providers:                Milus Banister, MD, Jeanella Cara, RN,                            [place], Ladona Ridgel, Technician, Barrett Henle, CRNA Referring MD:              Medicines:                Monitored Anesthesia Care Complications:            No immediate complications. Estimated blood loss:                            None. Estimated Blood Loss:     Estimated blood loss: none. Procedure:                Pre-Anesthesia Assessment:                           - Prior to the procedure, a History and Physical                            was performed, and patient medications and                            allergies were reviewed. The patient's tolerance of                            previous anesthesia was also reviewed. The risks                            and benefits of the procedure and the sedation                            options and risks were discussed with the patient.                            All questions were answered, and informed consent                            was obtained. Prior Anticoagulants: The patient has                            taken no previous anticoagulant or antiplatelet  agents. ASA Grade Assessment: III - A patient with                            severe systemic disease. After reviewing the risks                            and benefits, the patient was deemed in                            satisfactory condition to undergo the procedure.                           After obtaining informed consent, the colonoscope                             was passed under direct vision. Throughout the                            procedure, the patient's blood pressure, pulse, and                            oxygen saturations were monitored continuously. The                            CF-HQ190L ([id number]) Olympus colonoscope was                            introduced through the anus and advanced to the the                            cecum, identified by appendiceal orifice and                            ileocecal valve. The colonoscopy was performed                            without difficulty. The patient tolerated the                            procedure well. The quality of the bowel                            preparation was adequate. The ileocecal valve,                            appendiceal orifice, and rectum were photographed. Scope In: 12:15:43 PM Scope Out: 28:00:34 PM Scope Withdrawal Time: 0 hours 7 minutes 43 seconds  Total Procedure Duration: 0 hours 20 minutes 28 seconds  Findings:      Multiple small and large-mouthed diverticula were found in the left       colon.      The exam was otherwise without abnormality on direct and retroflexion       views. Impression:               -  Diverticulosis in the left colon.                           - The examination was otherwise normal on direct                            and retroflexion views.                           - No polyps, cancers or AVMs Moderate Sedation:      Not Applicable - Patient had care per Anesthesia. Recommendation:           - OK to discharge home from GI perspective. Follow                            up as needed. Procedure Code(s):        --- Professional ---                           [id number], Colonoscopy, flexible; diagnostic, including                            collection of specimen(s) by brushing or washing,                            when performed (separate procedure) Diagnosis Code(s):        --- Professional ---                            K92.1, Melena (includes Hematochezia)                           K57.30, Diverticulosis of large intestine without                            perforation or abscess without bleeding CPT copyright 2019 American Medical Association. All rights reserved. The codes documented in this report are preliminary and upon coder review may  be revised to meet current compliance requirements. Milus Banister, MD 04/15/2021 12:52:31 PM This report has been signed electronically. Number of Addenda: 0

## 2021-04-15 NOTE — Transfer of Care (Signed)
Immediate Anesthesia Transfer of Care Note  Patient: Paula Ward  Procedure(s) Performed: COLONOSCOPY WITH PROPOFOL  Patient Location: Endoscopy Unit  Anesthesia Type:MAC  Level of Consciousness: awake, alert , oriented and patient cooperative  Airway & Oxygen Therapy: Patient Spontanous Breathing and Patient connected to face mask oxygen  Post-op Assessment: Report given to RN, Post -op Vital signs reviewed and stable and Patient moving all extremities  Post vital signs: Reviewed and stable  Last Vitals:  Vitals Value Taken Time  BP 120/39 04/15/21 1243  Temp    Pulse 92 04/15/21 1245  Resp 19 04/15/21 1245  SpO2 96 % 04/15/21 1245  Vitals shown include unvalidated device data.  Last Pain:  Vitals:   04/15/21 1121  TempSrc: Oral  PainSc: 0-No pain         Complications: No notable events documented.

## 2021-04-15 NOTE — Discharge Summary (Signed)
Physician Discharge Summary  Paula Ward MMH:[id number] DOB: 01-Jan-1936 DOA: 04/12/2021  PCP: Hoyt Koch, MD  Admit date: 04/12/2021 Discharge date: 04/15/2021  Admitted From: home Disposition:  home  Recommendations for Outpatient Follow-up:  Follow up with PCP in 1-2 weeks  Home Health: none Equipment/Devices: none  Discharge Condition: stable CODE STATUS: Full code Diet recommendation: regular  HPI: Per admitting MD, Paula Ward is a 85 y.o. 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 Pt recently admitted 14 days ago for UGIB with melena.  Transfused PRBC that admit.  EGD found AVMs but nothing actively bleeding.  Pt discharged on PPI. After discharge pt had normal stool initially, but over the past few days has had recurrence of melena. HGB has been followed closely by her PCP and has gone from 9.4 on 6/13 to 8.4 on 6/21 to 7.8 today prompting her PCP to send her back in to ED for recurrent UGIB. Pt denies abd pain, N/V. Pt off of all anticoagulants including ASA at this point and has been since before last admit due to h/o GIBs.  Hospital Course / Discharge diagnoses: Principal Problem Acute blood loss anemia due to upper GI bleed, history of AVMs-patient was admitted to the hospital with acute blood loss anemia in the setting of melena, received a unit of packed red blood cells on admission.  GI consulted and followed patient while hospitalized.  She underwent an EGD on 6/25 in which multiple AVMs were found status post APC.  She also underwent a colonoscopy on 6/26 which showed diverticulosis but no other findings to suggest a colonic bleed.  Her bleeding has resolved, her hemoglobin has remained stable and actually increasing on its own, her melena has resolved and will be discharged home in stable condition.   Active Problems History of scleroderma,  PAH, pericardial effusion-all appear stable at this point, at baseline.  Continue home medications Essential hypertension-continue home medications Chronic kidney disease stage IIIa-stable, creatinine at baseline History of PE-not on anticoagulation due to recurrent GI bleeds.   Sepsis ruled out   Discharge Instructions   Allergies as of 04/15/2021       Reactions   Codeine Nausea Only   Hallucinations, too   Other Nausea And Vomiting   "-mycin" antibiotics.   Also cause hallucinations.   Erythromycin Nausea And Vomiting   Lisinopril    Pt doesn't remember    Mycophenolate Mofetil Nausea Only   Sulfa Antibiotics Other (See Comments)   unknown        Medication List     TAKE these medications    Accu-Chek Aviva Plus test strip Generic drug: glucose blood   acetaminophen 325 MG tablet Commonly known as: TYLENOL Take 650 mg by mouth every 6 (six) hours as needed for mild pain.   albuterol 108 (90 Base) MCG/ACT inhaler Commonly known as: VENTOLIN HFA INHALE 2 PUFFS INTO THE LUNGS EVERY 6 HOURS AS NEEDED FOR WHEEZING OR SHORTNESS OF BREATH   atovaquone 750 MG/5ML suspension Commonly known as: MEPRON Take 750 mg by mouth at bedtime.   diltiazem 120 MG 24 hr capsule Commonly known as: CARDIZEM CD Take 120 mg by mouth daily.   Dry Mouth Mouthwash Liqd Use as directed 5 mLs in the mouth or throat daily as needed (dry mouth). Biotene   furosemide 20 MG tablet Commonly known as: LASIX Take 1 tablet (20  mg total) by mouth daily.   meclizine 12.5 MG tablet Commonly known as: ANTIVERT Take 1-2 tablets (12.5-25 mg total) by mouth 3 (three) times daily as needed for dizziness.   montelukast 10 MG tablet Commonly known as: SINGULAIR TAKE 1 TABLET(10 MG) BY MOUTH AT BEDTIME   MULTIVITAMIN/IRON PO Take 1 tablet by mouth daily.   Opsumit 10 MG tablet Generic drug: macitentan Take 10 mg by mouth daily.   pantoprazole 40 MG tablet Commonly known as: PROTONIX Take  1 tablet (40 mg total) by mouth daily.   predniSONE 1 MG tablet Commonly known as: DELTASONE Take 1 mg by mouth in the morning, at noon, in the evening, and at bedtime.   spironolactone 25 MG tablet Commonly known as: ALDACTONE Take 12.5 mg by mouth daily.       ASK your doctor about these medications    ALPRAZolam 0.25 MG tablet Commonly known as: XANAX Take 0.5 tablets (0.125 mg total) by mouth at bedtime as needed.   fluticasone 50 MCG/ACT nasal spray Commonly known as: FLONASE INSTILL 1 SPRAY INTO EACH NOSTRIL TWICE A DAY IF NEEDED         Consultations: GI   Procedures/Studies: EGD Colonoscopy   DG Chest 2 View  Result Date: 03/30/2021 CLINICAL DATA:  Hypoxia.  History of upper GI bleed. EXAM: CHEST - 2 VIEW COMPARISON:  03/28/2021 FINDINGS: Stable prominent cardiopericardial silhouette likely due to chronic pericardial effusion. Stable tortuosity and calcification of the thoracic aorta. Persistent left lower lobe process likely a combination of effusion, atelectasis and/or infiltrate. IMPRESSION: Stable prominent cardiopericardial silhouette. Persistent left lower lobe process. Electronically Signed   By: Marijo Sanes M.D.   On: 03/30/2021 12:05   DG Chest 2 View  Result Date: 03/28/2021 CLINICAL DATA:  Weakness EXAM: CHEST - 2 VIEW COMPARISON:  March 22, 2021 FINDINGS: There is cardiomegaly with pulmonary venous hypertension. There is a degree of interstitial thickening without frank edema or consolidation. There is no appreciable adenopathy. There is aortic atherosclerosis. There are surgical clips in the left breast and left axillary regions. 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 IMPRESSION: Rather marked cardiomegaly with pulmonary vascular congestion. A degree of underlying pericardial effusion on the left cannot be excluded. No frank edema or consolidation. Suspect underlying chronic bronchitis given interstitial thickening. Postoperative change left breast.  Aortic Atherosclerosis (ICD10-I70.0). Electronically Signed   By: Lowella Grip III M.D.   On: 03/28/2021 14:11   DG Chest 2 View  Result Date: 03/22/2021 CLINICAL DATA:  Pneumonia follow-up. EXAM: CHEST - 2 VIEW COMPARISON:  Chest x-ray dated Mar 05, 2021. FINDINGS: Stable markedly enlarged cardiopericardial silhouette related to the underlying large pericardial effusion. Chronic small left pleural effusion. No consolidation or pneumothorax. No acute osseous abnormality. IMPRESSION: 1. Stable chronic changes.  No pneumonia. Electronically Signed   By: Titus Dubin M.D.   On: 03/22/2021 14:08   CT CHEST WO CONTRAST  Result Date: 03/30/2021 CLINICAL DATA:  Pneumonia EXAM: CT CHEST WITHOUT CONTRAST TECHNIQUE: Multidetector CT imaging of the chest was performed following the standard protocol without IV contrast. COMPARISON:  April 04, 2019 FINDINGS: Cardiovascular: Large pericardial effusion. Cardiomegaly. Atherosclerotic calcifications of the aorta. Aortic valve calcifications. Mediastinum/Nodes: Thyroid is unremarkable. No axillary adenopathy. Status post LEFT axillary node sampling. No definitive mediastinal adenopathy. Lungs/Pleura: Small to moderate LEFT pleural effusion. Trace RIGHT pleural effusion. Subpleural reticulation bilaterally. Anteriorly located subpleural reticulation of the LEFT breast likely reflecting sequela of radiation related changes. Irregular RIGHT apical pulmonary nodule  versus scar measures 6 x 3 mm (series 7, image 27). Several adjacent RIGHT middle lobe pulmonary nodules each measure approximately 4 mm (series 7, image 104). LEFT lower lobe consolidative opacity, nonspecific. RIGHT basilar linear opacity most consistent with atelectasis. Upper Abdomen: No acute abnormality. Musculoskeletal: Postsurgical changes in the LEFT breast. IMPRESSION: 1. LEFT lower lobe consolidative opacity, nonspecific with differential considerations including infection or atelectasis. 2. Large  pericardial effusion, similar in comparison to prior. 3. Small LEFT and trace RIGHT pleural effusions. Bilateral subpleural reticulation likely reflects a degree of underlying interstitial lung disease. 4. Scattered sub 5 mm pulmonary nodules, nonspecific. Recommend follow-up chest CT in 1 year to assess for stability. Aortic Atherosclerosis (ICD10-I70.0). Electronically Signed   By: Valentino Saxon MD   On: 03/30/2021 16:11     Subjective: - no chest pain, shortness of breath, no abdominal pain, nausea or vomiting.   Discharge Exam: BP (!) 114/49 (BP Location: Left Arm)   Pulse 89   Temp 97.7 F (36.5 C) (Oral)   Resp 16   Ht _0  (1.6 m)   Wt 48.7 kg   SpO2 94%   BMI 19.02 kg/m   General: Pt is alert, awake, not in acute distress Cardiovascular: RRR, S1/S2 +, no rubs, no gallops Respiratory: CTA bilaterally, no wheezing, no rhonchi Abdominal: Soft, NT, ND, bowel sounds + Extremities: no edema, no cyanosis    The results of significant diagnostics from this hospitalization (including imaging, microbiology, ancillary and laboratory) are listed below for reference.     Microbiology: Recent Results (from the past 240 hour(s))  Resp Panel by RT-PCR (Flu A&B, Covid) Nasopharyngeal Swab     Status: None   Collection Time: 04/12/21  5:44 PM   Specimen: Nasopharyngeal Swab; Nasopharyngeal(NP) swabs in vial transport medium  Result Value Ref Range Status   SARS Coronavirus 2 by RT PCR NEGATIVE NEGATIVE Final    Comment: (NOTE) SARS-CoV-2 target nucleic acids are NOT DETECTED.  The SARS-CoV-2 RNA is generally detectable in upper respiratory specimens during the acute phase of infection. The lowest concentration of SARS-CoV-2 viral copies this assay can detect is 138 copies/mL. A negative result does not preclude SARS-Cov-2 infection and should not be used as the sole basis for treatment or other patient management decisions. A negative result may occur with  improper specimen  collection/handling, submission of specimen other than nasopharyngeal swab, presence of viral mutation(s) within the areas targeted by this assay, and inadequate number of viral copies(<138 copies/mL). A negative result must be combined with clinical observations, patient history, and epidemiological information. The expected result is Negative.  Fact Sheet for Patients:  EntrepreneurPulse.com.au  Fact Sheet for Healthcare Providers:  IncredibleEmployment.be  This test is no t yet approved or cleared by the [place] FDA and  has been authorized for detection and/or diagnosis of SARS-CoV-2 by FDA under an Emergency Use Authorization (EUA). This EUA will remain  in effect (meaning this test can be used) for the duration of the COVID-19 declaration under Section 564(b)(1) of the Act, 21 U.S.C.section 360bbb-3(b)(1), unless the authorization is terminated  or revoked sooner.       Influenza A by PCR NEGATIVE NEGATIVE Final   Influenza B by PCR NEGATIVE NEGATIVE Final    Comment: (NOTE) The Xpert Xpress SARS-CoV-2/FLU/RSV plus assay is intended as an aid in the diagnosis of influenza from Nasopharyngeal swab specimens and should not be used as a sole basis for treatment. Nasal washings and aspirates are unacceptable for Xpert Xpress  SARS-CoV-2/FLU/RSV testing.  Fact Sheet for Patients: EntrepreneurPulse.com.au  Fact Sheet for Healthcare Providers: IncredibleEmployment.be  This test is not yet approved or cleared by the [place] FDA and has been authorized for detection and/or diagnosis of SARS-CoV-2 by FDA under an Emergency Use Authorization (EUA). This EUA will remain in effect (meaning this test can be used) for the duration of the COVID-19 declaration under Section 564(b)(1) of the Act, 21 U.S.C. section 360bbb-3(b)(1), unless the authorization is terminated or revoked.  Performed at [facility], [place] [address], [place], [place] [address]      Labs: Basic Metabolic Panel: Recent Labs  Lab 04/12/21 1208 04/12/21 1758 04/13/21 0615 04/14/21 0533 04/15/21 0457  NA 136 136 137 137 141  K 3.7 4.1 3.6 4.3 3.7  CL 97 99 100 100 102  CO2 _0 GLUCOSE 131* 105* 81 91 87  BUN 44* 45* 39* 29* 30*  CREATININE 1.50* 1.54* 1.29* 1.23* 1.28*  CALCIUM 9.2 9.0 8.6* 8.8* 9.0   Liver Function Tests: Recent Labs  Lab 04/09/21 1339 04/12/21 1208 04/12/21 1758  AST _1 ALT _2 ALKPHOS 31* 30* 31*  BILITOT 0.5 0.5 0.6  PROT 6.5 6.3 6.4*  ALBUMIN 4.3 4.2 4.1   CBC: Recent Labs  Lab 04/12/21 1208 04/12/21 1758 04/13/21 0615 04/14/21 0533 04/15/21 0457  WBC 7.2 7.2 6.6 11.1* 6.5  NEUTROABS  --  5.8  --   --   --   HGB 7.8 Repeated and verified X2.* 7.5* 8.6* 9.4* 9.9*  HCT 23.4 Repeated and verified X2.* 24.3* 26.8* 29.1* 31.3*  MCV 98.3 105.7* 99.3 99.7 101.3*  PLT 298.0 286 258 275 302   CBG: No results for input(s): GLUCAP in the last 168 hours. Hgb A1c No results for input(s): HGBA1C in the last 72 hours. Lipid Profile No results for input(s): CHOL, HDL, LDLCALC, TRIG, CHOLHDL, LDLDIRECT in the last 72 hours. Thyroid function studies No results for input(s): TSH, T4TOTAL, T3FREE, THYROIDAB in the last 72 hours.  Invalid input(s): FREET3 Urinalysis    Component Value Date/Time   COLORURINE YELLOW 03/30/2021 [place] 03/30/2021 1416   LABSPEC 1.013 03/30/2021 1416   PHURINE 5.0 03/30/2021 1416   GLUCOSEU NEGATIVE 03/30/2021 1416   [place] 01/10/2021 0958   HGBUR NEGATIVE 03/30/2021 1416   BILIRUBINUR NEGATIVE 03/30/2021 1416   BILIRUBINUR Neg 09/03/2017 1529   KETONESUR NEGATIVE 03/30/2021 1416   PROTEINUR NEGATIVE 03/30/2021 1416   UROBILINOGEN 0.2 01/10/2021 0958   NITRITE NEGATIVE 03/30/2021 1416   LEUKOCYTESUR NEGATIVE 03/30/2021 1416    FURTHER DISCHARGE INSTRUCTIONS:    Get Medicines reviewed and adjusted: Please take all your medications with you for your next visit with your Primary MD   Laboratory/radiological data: Please request your Primary MD to go over all hospital tests and procedure/radiological results at the follow up, please ask your Primary MD to get all Hospital records sent to his/her office.   In some cases, they will be blood work, cultures and biopsy results pending at the time of your discharge. Please request that your primary care M.D. goes through all the records of your hospital data and follows up on these results.   Also Note the following: If you experience worsening of your admission symptoms, develop shortness of breath, life threatening emergency, suicidal or homicidal thoughts you must seek medical attention immediately by calling 911 or calling your MD immediately  if symptoms  less severe.   You must read complete instructions/literature along with all the possible adverse reactions/side effects for all the Medicines you take and that have been prescribed to you. Take any new Medicines after you have completely understood and accpet all the possible adverse reactions/side effects.    Do not drive when taking Pain medications or sleeping medications (Benzodaizepines)   Do not take more than prescribed Pain, Sleep and Anxiety Medications. It is not advisable to combine anxiety,sleep and pain medications without talking with your primary care practitioner   Special Instructions: If you have smoked or chewed Tobacco  in the last 2 yrs please stop smoking, stop any regular Alcohol  and or any Recreational drug use.   Wear Seat belts while driving.   Please note: You were cared for by a hospitalist during your hospital stay. Once you are discharged, your primary care physician will handle any further medical issues. Please note that NO REFILLS for any discharge medications will be authorized once you are discharged, as it is  imperative that you return to your primary care physician (or establish a relationship with a primary care physician if you do not have one) for your post hospital discharge needs so that they can reassess your need for medications and monitor your lab values.  Time coordinating discharge: 40 minutes  SIGNED:  Marzetta Board, MD, PhD 04/15/2021, 1:42 PM

## 2021-04-15 NOTE — Anesthesia Postprocedure Evaluation (Signed)
Anesthesia Post Note  Patient: SHERRINE SALBERG  Procedure(s) Performed: COLONOSCOPY WITH PROPOFOL     Patient location during evaluation: PACU Anesthesia Type: MAC Level of consciousness: awake Pain management: pain level controlled Vital Signs Assessment: post-procedure vital signs reviewed and stable Respiratory status: spontaneous breathing, nonlabored ventilation, respiratory function stable and patient connected to nasal cannula oxygen Cardiovascular status: stable and blood pressure returned to baseline Postop Assessment: no apparent nausea or vomiting Anesthetic complications: no   No notable events documented.  Last Vitals:  Vitals:   04/15/21 1301 04/15/21 1335  BP: (!) 138/47 (!) 114/49  Pulse: 92 89  Resp: 19 16  Temp: 36.6 C 36.5 C  SpO2: 95% 94%    Last Pain:  Vitals:   04/15/21 1335  TempSrc: Oral  PainSc:                  Yahmir Sokolov P Savvy Peeters

## 2021-04-15 NOTE — Progress Notes (Signed)
Patient completed bowel prep prior by midnight and has been NPO since then. She has been having clear , yellowish BM with some some greenish residue. Patient denies pain or discomfort  at this time.

## 2021-04-15 NOTE — Interval H&P Note (Signed)
History and Physical Interval Note:  04/15/2021 11:52 AM  [name]  has presented today for surgery, with the diagnosis of iron deficiency anemia, bleeding.  The various methods of treatment have been discussed with the patient and family. After consideration of risks, benefits and other options for treatment, the patient has consented to  Procedure(s): COLONOSCOPY WITH PROPOFOL (N/A) as a surgical intervention.  The patient's history has been reviewed, patient examined, no change in status, stable for surgery.  I have reviewed the patient's chart and labs.  Questions were answered to the patient's satisfaction.     Paula Ward

## 2021-04-15 NOTE — Anesthesia Postprocedure Evaluation (Signed)
Anesthesia Post Note  Patient: SABRE ROMBERGER  Procedure(s) Performed: COLONOSCOPY WITH PROPOFOL     Patient location during evaluation: Endoscopy Anesthesia Type: MAC Level of consciousness: awake Pain management: pain level controlled Vital Signs Assessment: post-procedure vital signs reviewed and stable Respiratory status: spontaneous breathing, nonlabored ventilation, respiratory function stable and patient connected to nasal cannula oxygen Cardiovascular status: stable and blood pressure returned to baseline Postop Assessment: no apparent nausea or vomiting Anesthetic complications: no   No notable events documented.  Last Vitals:  Vitals:   04/15/21 1301 04/15/21 1335  BP: (!) 138/47 (!) 114/49  Pulse: 92 89  Resp: 19 16  Temp: 36.6 C 36.5 C  SpO2: 95% 94%    Last Pain:  Vitals:   04/15/21 1335  TempSrc: Oral  PainSc:                  Dimitra Woodstock P Macguire Holsinger

## 2021-04-15 NOTE — Anesthesia Preprocedure Evaluation (Signed)
Anesthesia Evaluation  Patient identified by MRN, date of birth, ID band Patient awake    Reviewed: Allergy & Precautions, NPO status , Patient's Chart, lab work & pertinent test results  History of Anesthesia Complications (+) PONV and history of anesthetic complications  Airway Mallampati: III  TM Distance: >3 FB Neck ROM: Full    Dental no notable dental hx.    Pulmonary PE   Pulmonary exam normal breath sounds clear to auscultation       Cardiovascular hypertension, Pt. on medications +CHF  Normal cardiovascular exam Rhythm:Regular Rate:Normal  Pulmonary hypertension   Neuro/Psych negative neurological ROS  negative psych ROS   GI/Hepatic Neg liver ROS, GERD  Medicated,  Endo/Other  negative endocrine ROS  Renal/GU negative Renal ROS     Musculoskeletal  (+) Arthritis , CREST syndrome    Abdominal   Peds  Hematology  (+) anemia ,   Anesthesia Other Findings iron deficiency anemia, bleeding  Reproductive/Obstetrics                             Anesthesia Physical Anesthesia Plan  ASA: 3  Anesthesia Plan: MAC   Post-op Pain Management:    Induction: Intravenous  PONV Risk Score and Plan: 3 and Propofol infusion and Treatment may vary due to age or medical condition  Airway Management Planned: Simple Face Mask  Additional Equipment:   Intra-op Plan:   Post-operative Plan:   Informed Consent: I have reviewed the patients History and Physical, chart, labs and discussed the procedure including the risks, benefits and alternatives for the proposed anesthesia with the patient or authorized representative who has indicated his/her understanding and acceptance.     Dental advisory given  Plan Discussed with: CRNA  Anesthesia Plan Comments:         Anesthesia Quick Evaluation

## 2021-04-16 LAB — BPAM RBC
Blood Product Expiration Date: 202207122359
Blood Product Expiration Date: 202207122359
Blood Product Expiration Date: 202207122359
ISSUE DATE / TIME: 202206242225
Unit Type and Rh: 6200
Unit Type and Rh: 6200
Unit Type and Rh: 6200

## 2021-04-16 LAB — TYPE AND SCREEN
ABO/RH(D): A POS
Antibody Screen: NEGATIVE
Unit division: 0
Unit division: 0
Unit division: 0

## 2021-04-18 ENCOUNTER — Encounter (HOSPITAL_COMMUNITY): Payer: Self-pay | Admitting: Gastroenterology

## 2021-04-23 ENCOUNTER — Ambulatory Visit: Payer: Medicare PPO | Admitting: Internal Medicine

## 2021-04-23 ENCOUNTER — Other Ambulatory Visit: Payer: Self-pay

## 2021-04-23 ENCOUNTER — Encounter: Payer: Self-pay | Admitting: Internal Medicine

## 2021-04-23 VITALS — BP 120/62 | HR 68 | Temp 97.9°F | Resp 18 | Ht 63.0 in | Wt 101.4 lb

## 2021-04-23 DIAGNOSIS — D62 Acute posthemorrhagic anemia: Secondary | ICD-10-CM | POA: Diagnosis not present

## 2021-04-23 DIAGNOSIS — I313 Pericardial effusion (noninflammatory): Secondary | ICD-10-CM | POA: Diagnosis not present

## 2021-04-23 DIAGNOSIS — N1831 Chronic kidney disease, stage 3a: Secondary | ICD-10-CM | POA: Diagnosis not present

## 2021-04-23 DIAGNOSIS — I3139 Other pericardial effusion (noninflammatory): Secondary | ICD-10-CM

## 2021-04-23 DIAGNOSIS — J849 Interstitial pulmonary disease, unspecified: Secondary | ICD-10-CM | POA: Diagnosis not present

## 2021-04-23 DIAGNOSIS — K31819 Angiodysplasia of stomach and duodenum without bleeding: Secondary | ICD-10-CM

## 2021-04-23 LAB — COMPREHENSIVE METABOLIC PANEL
ALT: 11 U/L (ref 0–35)
AST: 21 U/L (ref 0–37)
Albumin: 4.2 g/dL (ref 3.5–5.2)
Alkaline Phosphatase: 38 U/L — ABNORMAL LOW (ref 39–117)
BUN: 49 mg/dL — ABNORMAL HIGH (ref 6–23)
CO2: 31 mEq/L (ref 19–32)
Calcium: 9.4 mg/dL (ref 8.4–10.5)
Chloride: 98 mEq/L (ref 96–112)
Creatinine, Ser: 1.71 mg/dL — ABNORMAL HIGH (ref 0.40–1.20)
GFR: 27.53 mL/min — ABNORMAL LOW (ref >60.00)
Glucose, Bld: 89 mg/dL (ref 70–99)
Potassium: 4 mEq/L (ref 3.5–5.1)
Sodium: 136 mEq/L (ref 135–145)
Total Bilirubin: 0.5 mg/dL (ref 0.2–1.2)
Total Protein: 6.5 g/dL (ref 6.0–8.3)

## 2021-04-23 LAB — CBC WITH DIFFERENTIAL/PLATELET
Basophils Absolute: 0 10*3/uL (ref 0.0–0.1)
Basophils Relative: 0.6 % (ref 0.0–3.0)
Eosinophils Absolute: 0 10*3/uL (ref 0.0–0.7)
Eosinophils Relative: 0.4 % (ref 0.0–5.0)
HCT: 30 % — ABNORMAL LOW (ref 36.0–46.0)
Hemoglobin: 10 g/dL — ABNORMAL LOW (ref 12.0–15.0)
Lymphocytes Relative: 16.5 % (ref 12.0–46.0)
Lymphs Abs: 1.2 10*3/uL (ref 0.7–4.0)
MCHC: 33.2 g/dL (ref 30.0–36.0)
MCV: 97.5 fl (ref 78.0–100.0)
Monocytes Absolute: 0.5 10*3/uL (ref 0.1–1.0)
Monocytes Relative: 7.2 % (ref 3.0–12.0)
Neutro Abs: 5.5 10*3/uL (ref 1.4–7.7)
Neutrophils Relative %: 75.3 % (ref 43.0–77.0)
Platelets: 269 10*3/uL (ref 150.0–400.0)
RBC: 3.08 Mil/uL — ABNORMAL LOW (ref 3.87–5.11)
RDW: 17.9 % — ABNORMAL HIGH (ref 11.5–15.5)
WBC: 7.4 10*3/uL (ref 4.0–10.5)

## 2021-04-23 NOTE — Patient Instructions (Addendum)
Call us or mychart if having any dark stools.  We will check the labs today.

## 2021-04-23 NOTE — Progress Notes (Signed)
Subjective:   Patient ID: Paula Ward, female    DOB: 12-26-1935, 85 y.o.   MRN: [id number]  HPI The patient is an 85 YO female coming in for hospital follow up (with recent GI bleeding and had recurrence with dark stools and dropping Hg levels, admitted and had colonoscopy and EGD again with ablation of vessels on the endoscopy which she has also had done recently). She has not had blood in stool since leaving hospital. She has been eating some again. She is still very tired and weight is down since she was not able to eat for several days in the hospital due to the bleeding and GI workup. She is not needing oxygen as much which she feels hopeful is a sign that her blood counts are staying up. Denies vomiting, nausea, blood in stool. Denies abdominal pain.   PMH, [facility], social history reviewed and updated  Review of Systems  Constitutional:  Positive for activity change, appetite change and fatigue.  HENT: Negative.    Eyes: Negative.   Respiratory:  Negative for cough, chest tightness and shortness of breath.   Cardiovascular:  Negative for chest pain, palpitations and leg swelling.  Gastrointestinal:  Negative for abdominal distention, abdominal pain, constipation, diarrhea, nausea and vomiting.  Musculoskeletal: Negative.   Skin: Negative.   Neurological: Negative.   Psychiatric/Behavioral: Negative.     Objective:  Physical Exam Constitutional:      Appearance: She is well-developed.     Comments: Chronically ill appearing  HENT:     Head: Normocephalic and atraumatic.  Cardiovascular:     Rate and Rhythm: Normal rate and regular rhythm.  Pulmonary:     Effort: Pulmonary effort is normal. No respiratory distress.     Breath sounds: Normal breath sounds. No wheezing or rales.     Comments: No oxygen today Abdominal:     General: Bowel sounds are normal. There is no distension.     Palpations: Abdomen is soft.     Tenderness: There is no abdominal tenderness. There is no  rebound.  Musculoskeletal:     Cervical back: Normal range of motion.  Skin:    General: Skin is warm and dry.  Neurological:     Mental Status: She is alert and oriented to person, place, and time.     Coordination: Coordination normal.     Comments: Cane for stability but good gait    Vitals:   04/23/21 1105  BP: 120/62  Pulse: 68  Resp: 18  Temp: 97.9 F (36.6 C)  TempSrc: Oral  SpO2: 96%  Weight: 101 lb 6.4 oz (46 kg)  Height: 5' 3" (1.6 m)    This visit occurred during the SARS-CoV-2 public health emergency.  Safety protocols were in place, including screening questions prior to the visit, additional usage of staff PPE, and extensive cleaning of exam room while observing appropriate contact time as indicated for disinfecting solutions.   Assessment & Plan:

## 2021-04-24 ENCOUNTER — Encounter: Payer: Self-pay | Admitting: Internal Medicine

## 2021-04-24 NOTE — Assessment & Plan Note (Signed)
Not requiring oxygen today except with exertion at home. She is hoping that with the elevation in her blood counts if those can be maintained she may be able to use oxygen less often.

## 2021-04-24 NOTE — Assessment & Plan Note (Signed)
Checking CBC today and reordered standing orders for monthly given her medications.

## 2021-04-24 NOTE — Assessment & Plan Note (Signed)
No change in breathing or signs of progression.

## 2021-04-24 NOTE — Assessment & Plan Note (Signed)
She did have several more lesions ablated during endoscopy while inpatient. She has no current signs of bleeding and she is able to detect this.

## 2021-04-24 NOTE — Assessment & Plan Note (Signed)
Checking CMP and adjust as needed. She has had some dehydration recently and GI bleeding. Adjust as needed. Also reordered monthly labs for CMP due to medications in the setting of her CKD 3a.

## 2021-04-29 ENCOUNTER — Other Ambulatory Visit: Payer: Self-pay | Admitting: Internal Medicine

## 2021-05-01 DIAGNOSIS — I5020 Unspecified systolic (congestive) heart failure: Secondary | ICD-10-CM | POA: Diagnosis not present

## 2021-05-13 ENCOUNTER — Other Ambulatory Visit: Payer: Self-pay

## 2021-05-13 ENCOUNTER — Other Ambulatory Visit (INDEPENDENT_AMBULATORY_CARE_PROVIDER_SITE_OTHER): Payer: Medicare PPO

## 2021-05-13 DIAGNOSIS — N1831 Chronic kidney disease, stage 3a: Secondary | ICD-10-CM | POA: Diagnosis not present

## 2021-05-13 LAB — COMPREHENSIVE METABOLIC PANEL
ALT: 9 U/L (ref 0–35)
AST: 18 U/L (ref 0–37)
Albumin: 3.9 g/dL (ref 3.5–5.2)
Alkaline Phosphatase: 31 U/L — ABNORMAL LOW (ref 39–117)
BUN: 35 mg/dL — ABNORMAL HIGH (ref 6–23)
CO2: 30 mEq/L (ref 19–32)
Calcium: 9.3 mg/dL (ref 8.4–10.5)
Chloride: 100 mEq/L (ref 96–112)
Creatinine, Ser: 1.36 mg/dL — ABNORMAL HIGH (ref 0.40–1.20)
GFR: 36.22 mL/min — ABNORMAL LOW (ref >60.00)
Glucose, Bld: 81 mg/dL (ref 70–99)
Potassium: 4.1 mEq/L (ref 3.5–5.1)
Sodium: 137 mEq/L (ref 135–145)
Total Bilirubin: 0.5 mg/dL (ref 0.2–1.2)
Total Protein: 6 g/dL (ref 6.0–8.3)

## 2021-05-13 LAB — CBC WITH DIFFERENTIAL/PLATELET
Basophils Absolute: 0 10*3/uL (ref 0.0–0.1)
Basophils Relative: 0.6 % (ref 0.0–3.0)
Eosinophils Absolute: 0.1 10*3/uL (ref 0.0–0.7)
Eosinophils Relative: 1.1 % (ref 0.0–5.0)
HCT: 27.7 % — ABNORMAL LOW (ref 36.0–46.0)
Hemoglobin: 9.3 g/dL — ABNORMAL LOW (ref 12.0–15.0)
Lymphocytes Relative: 25.6 % (ref 12.0–46.0)
Lymphs Abs: 1.3 10*3/uL (ref 0.7–4.0)
MCHC: 33.6 g/dL (ref 30.0–36.0)
MCV: 97.9 fl (ref 78.0–100.0)
Monocytes Absolute: 0.4 10*3/uL (ref 0.1–1.0)
Monocytes Relative: 7.3 % (ref 3.0–12.0)
Neutro Abs: 3.4 10*3/uL (ref 1.4–7.7)
Neutrophils Relative %: 65.4 % (ref 43.0–77.0)
Platelets: 224 10*3/uL (ref 150.0–400.0)
RBC: 2.83 Mil/uL — ABNORMAL LOW (ref 3.87–5.11)
RDW: 17.4 % — ABNORMAL HIGH (ref 11.5–15.5)
WBC: 5.2 10*3/uL (ref 4.0–10.5)

## 2021-05-14 ENCOUNTER — Encounter: Payer: Self-pay | Admitting: Internal Medicine

## 2021-05-27 ENCOUNTER — Other Ambulatory Visit: Payer: Self-pay | Admitting: Internal Medicine

## 2021-06-01 DIAGNOSIS — I5020 Unspecified systolic (congestive) heart failure: Secondary | ICD-10-CM | POA: Diagnosis not present

## 2021-06-06 ENCOUNTER — Other Ambulatory Visit: Payer: Self-pay | Admitting: *Deleted

## 2021-06-06 MED ORDER — SUCRALFATE 1 GM/10ML PO SUSP: ORAL | 3 refills | Status: DC

## 2021-06-12 ENCOUNTER — Other Ambulatory Visit (INDEPENDENT_AMBULATORY_CARE_PROVIDER_SITE_OTHER): Payer: Medicare PPO

## 2021-06-12 ENCOUNTER — Other Ambulatory Visit: Payer: Self-pay

## 2021-06-12 DIAGNOSIS — N1831 Chronic kidney disease, stage 3a: Secondary | ICD-10-CM | POA: Diagnosis not present

## 2021-06-12 LAB — CBC WITH DIFFERENTIAL/PLATELET
Basophils Absolute: 0 10*3/uL (ref 0.0–0.1)
Basophils Relative: 0.6 % (ref 0.0–3.0)
Eosinophils Absolute: 0.1 10*3/uL (ref 0.0–0.7)
Eosinophils Relative: 1.7 % (ref 0.0–5.0)
HCT: 28.5 % — ABNORMAL LOW (ref 36.0–46.0)
Hemoglobin: 9.5 g/dL — ABNORMAL LOW (ref 12.0–15.0)
Lymphocytes Relative: 25.3 % (ref 12.0–46.0)
Lymphs Abs: 1.4 10*3/uL (ref 0.7–4.0)
MCHC: 33.5 g/dL (ref 30.0–36.0)
MCV: 98.2 fl (ref 78.0–100.0)
Monocytes Absolute: 0.5 10*3/uL (ref 0.1–1.0)
Monocytes Relative: 8.4 % (ref 3.0–12.0)
Neutro Abs: 3.6 10*3/uL (ref 1.4–7.7)
Neutrophils Relative %: 64 % (ref 43.0–77.0)
Platelets: 232 10*3/uL (ref 150.0–400.0)
RBC: 2.9 Mil/uL — ABNORMAL LOW (ref 3.87–5.11)
RDW: 16.1 % — ABNORMAL HIGH (ref 11.5–15.5)
WBC: 5.6 10*3/uL (ref 4.0–10.5)

## 2021-06-12 LAB — COMPREHENSIVE METABOLIC PANEL
ALT: 8 U/L (ref 0–35)
AST: 21 U/L (ref 0–37)
Albumin: 4.1 g/dL (ref 3.5–5.2)
Alkaline Phosphatase: 37 U/L — ABNORMAL LOW (ref 39–117)
BUN: 56 mg/dL — ABNORMAL HIGH (ref 6–23)
CO2: 27 mEq/L (ref 19–32)
Calcium: 9.7 mg/dL (ref 8.4–10.5)
Chloride: 99 mEq/L (ref 96–112)
Creatinine, Ser: 1.6 mg/dL — ABNORMAL HIGH (ref 0.40–1.20)
GFR: 29.79 mL/min — ABNORMAL LOW (ref >60.00)
Glucose, Bld: 84 mg/dL (ref 70–99)
Potassium: 4.1 mEq/L (ref 3.5–5.1)
Sodium: 136 mEq/L (ref 135–145)
Total Bilirubin: 0.5 mg/dL (ref 0.2–1.2)
Total Protein: 6.6 g/dL (ref 6.0–8.3)

## 2021-06-14 ENCOUNTER — Encounter: Payer: Self-pay | Admitting: Internal Medicine

## 2021-06-19 ENCOUNTER — Ambulatory Visit: Payer: Medicare PPO | Admitting: Gastroenterology

## 2021-06-20 DIAGNOSIS — Z961 Presence of intraocular lens: Secondary | ICD-10-CM | POA: Diagnosis not present

## 2021-06-20 DIAGNOSIS — H401431 Capsular glaucoma with pseudoexfoliation of lens, bilateral, mild stage: Secondary | ICD-10-CM | POA: Diagnosis not present

## 2021-06-26 DIAGNOSIS — Z888 Allergy status to other drugs, medicaments and biological substances status: Secondary | ICD-10-CM | POA: Diagnosis not present

## 2021-06-26 DIAGNOSIS — M3500 Sicca syndrome, unspecified: Secondary | ICD-10-CM | POA: Diagnosis not present

## 2021-06-26 DIAGNOSIS — J9611 Chronic respiratory failure with hypoxia: Secondary | ICD-10-CM | POA: Diagnosis not present

## 2021-06-26 DIAGNOSIS — I130 Hypertensive heart and chronic kidney disease with heart failure and stage 1 through stage 4 chronic kidney disease, or unspecified chronic kidney disease: Secondary | ICD-10-CM | POA: Diagnosis not present

## 2021-06-26 DIAGNOSIS — I739 Peripheral vascular disease, unspecified: Secondary | ICD-10-CM | POA: Diagnosis not present

## 2021-06-26 DIAGNOSIS — Z820 Family history of epilepsy and other diseases of the nervous system: Secondary | ICD-10-CM | POA: Diagnosis not present

## 2021-06-26 DIAGNOSIS — J8410 Pulmonary fibrosis, unspecified: Secondary | ICD-10-CM | POA: Diagnosis not present

## 2021-06-26 DIAGNOSIS — D84821 Immunodeficiency due to drugs: Secondary | ICD-10-CM | POA: Diagnosis not present

## 2021-06-26 DIAGNOSIS — M341 CR(E)ST syndrome: Secondary | ICD-10-CM | POA: Diagnosis not present

## 2021-06-26 DIAGNOSIS — I2721 Secondary pulmonary arterial hypertension: Secondary | ICD-10-CM | POA: Diagnosis not present

## 2021-06-26 DIAGNOSIS — I509 Heart failure, unspecified: Secondary | ICD-10-CM | POA: Diagnosis not present

## 2021-06-26 DIAGNOSIS — Z885 Allergy status to narcotic agent status: Secondary | ICD-10-CM | POA: Diagnosis not present

## 2021-07-01 ENCOUNTER — Other Ambulatory Visit: Payer: Self-pay | Admitting: Internal Medicine

## 2021-07-02 DIAGNOSIS — I5020 Unspecified systolic (congestive) heart failure: Secondary | ICD-10-CM | POA: Diagnosis not present

## 2021-07-15 ENCOUNTER — Other Ambulatory Visit: Payer: Self-pay

## 2021-07-15 ENCOUNTER — Other Ambulatory Visit (INDEPENDENT_AMBULATORY_CARE_PROVIDER_SITE_OTHER): Payer: Medicare PPO

## 2021-07-15 DIAGNOSIS — N1831 Chronic kidney disease, stage 3a: Secondary | ICD-10-CM

## 2021-07-15 LAB — CBC WITH DIFFERENTIAL/PLATELET
Basophils Absolute: 0 10*3/uL (ref 0.0–0.1)
Basophils Relative: 0.7 % (ref 0.0–3.0)
Eosinophils Absolute: 0.1 10*3/uL (ref 0.0–0.7)
Eosinophils Relative: 2.6 % (ref 0.0–5.0)
HCT: 27.8 % — ABNORMAL LOW (ref 36.0–46.0)
Hemoglobin: 9.3 g/dL — ABNORMAL LOW (ref 12.0–15.0)
Lymphocytes Relative: 25.6 % (ref 12.0–46.0)
Lymphs Abs: 1.3 10*3/uL (ref 0.7–4.0)
MCHC: 33.6 g/dL (ref 30.0–36.0)
MCV: 99 fl (ref 78.0–100.0)
Monocytes Absolute: 0.4 10*3/uL (ref 0.1–1.0)
Monocytes Relative: 6.9 % (ref 3.0–12.0)
Neutro Abs: 3.3 10*3/uL (ref 1.4–7.7)
Neutrophils Relative %: 64.2 % (ref 43.0–77.0)
Platelets: 249 10*3/uL (ref 150.0–400.0)
RBC: 2.8 Mil/uL — ABNORMAL LOW (ref 3.87–5.11)
RDW: 14.3 % (ref 11.5–15.5)
WBC: 5.2 10*3/uL (ref 4.0–10.5)

## 2021-07-15 LAB — COMPREHENSIVE METABOLIC PANEL
ALT: 8 U/L (ref 0–35)
AST: 20 U/L (ref 0–37)
Albumin: 4.2 g/dL (ref 3.5–5.2)
Alkaline Phosphatase: 40 U/L (ref 39–117)
BUN: 40 mg/dL — ABNORMAL HIGH (ref 6–23)
CO2: 28 mEq/L (ref 19–32)
Calcium: 9.6 mg/dL (ref 8.4–10.5)
Chloride: 97 mEq/L (ref 96–112)
Creatinine, Ser: 1.48 mg/dL — ABNORMAL HIGH (ref 0.40–1.20)
GFR: 32.69 mL/min — ABNORMAL LOW (ref >60.00)
Glucose, Bld: 84 mg/dL (ref 70–99)
Potassium: 4.2 mEq/L (ref 3.5–5.1)
Sodium: 134 mEq/L — ABNORMAL LOW (ref 135–145)
Total Bilirubin: 0.6 mg/dL (ref 0.2–1.2)
Total Protein: 6.8 g/dL (ref 6.0–8.3)

## 2021-07-25 ENCOUNTER — Encounter: Payer: Self-pay | Admitting: Internal Medicine

## 2021-07-25 ENCOUNTER — Ambulatory Visit: Payer: Medicare PPO | Admitting: Internal Medicine

## 2021-07-25 ENCOUNTER — Other Ambulatory Visit: Payer: Self-pay

## 2021-07-25 VITALS — BP 124/62 | HR 71 | Temp 97.8°F | Resp 18 | Ht 63.0 in | Wt 106.8 lb

## 2021-07-25 DIAGNOSIS — N1832 Chronic kidney disease, stage 3b: Secondary | ICD-10-CM | POA: Diagnosis not present

## 2021-07-25 DIAGNOSIS — D631 Anemia in chronic kidney disease: Secondary | ICD-10-CM

## 2021-07-25 DIAGNOSIS — D638 Anemia in other chronic diseases classified elsewhere: Secondary | ICD-10-CM

## 2021-07-25 NOTE — Patient Instructions (Signed)
We will get you in with the kidney doctor to see if they think you would benefit from shots to help the blood counts improve.

## 2021-07-25 NOTE — Progress Notes (Signed)
   Subjective:   Patient ID: Paula Ward, female    DOB: May 03, 1936, 85 y.o.   MRN: [id number]  HPI The patient is an 85 YO female coming in for follow up.   Review of Systems  Constitutional:  Positive for fatigue.  HENT: Negative.    Eyes: Negative.   Respiratory:  Negative for cough, chest tightness and shortness of breath.   Cardiovascular:  Negative for chest pain, palpitations and leg swelling.  Gastrointestinal:  Negative for abdominal distention, abdominal pain, constipation, diarrhea, nausea and vomiting.  Musculoskeletal: Negative.   Skin: Negative.   Neurological: Negative.   Psychiatric/Behavioral: Negative.     Objective:  Physical Exam Constitutional:      Appearance: She is well-developed.  HENT:     Head: Normocephalic and atraumatic.  Cardiovascular:     Rate and Rhythm: Normal rate and regular rhythm.  Pulmonary:     Effort: Pulmonary effort is normal. No respiratory distress.     Breath sounds: Normal breath sounds. No wheezing or rales.  Abdominal:     General: Bowel sounds are normal. There is no distension.     Palpations: Abdomen is soft.     Tenderness: There is no abdominal tenderness. There is no rebound.  Musculoskeletal:     Cervical back: Normal range of motion.  Skin:    General: Skin is warm and dry.  Neurological:     Mental Status: She is alert and oriented to person, place, and time.     Coordination: Coordination normal.    Vitals:   07/25/21 1027  BP: 124/62  Pulse: 71  Resp: 18  Temp: 97.8 F (36.6 C)  TempSrc: Oral  SpO2: 98%  Weight: 106 lb 12.8 oz (48.4 kg)  Height: _0  (1.6 m)    This visit occurred during the SARS-CoV-2 public health emergency.  Safety protocols were in place, including screening questions prior to the visit, additional usage of staff PPE, and extensive cleaning of exam room while observing appropriate contact time as indicated for disinfecting solutions.   Assessment & Plan:

## 2021-07-26 ENCOUNTER — Encounter: Payer: Self-pay | Admitting: Internal Medicine

## 2021-07-26 NOTE — Assessment & Plan Note (Signed)
She does have anemia which could be related to chronic disease and CKD. Referral to nephrology to assess if epogen could be helpful for maintaining more consistent levels for Hg.

## 2021-07-26 NOTE — Assessment & Plan Note (Signed)
Referral to nephrology due to worsening GFR down to around 32 most recent. This could be contributing to poor Hg production and I wonder if starting on epogen could help her Hg improve to around 10. She does have significantly more energy at that level. She is on medications which can impact renal function long term which she requires.

## 2021-08-01 DIAGNOSIS — I5020 Unspecified systolic (congestive) heart failure: Secondary | ICD-10-CM | POA: Diagnosis not present

## 2021-08-05 DIAGNOSIS — L858 Other specified epidermal thickening: Secondary | ICD-10-CM | POA: Diagnosis not present

## 2021-08-05 DIAGNOSIS — L821 Other seborrheic keratosis: Secondary | ICD-10-CM | POA: Diagnosis not present

## 2021-08-05 DIAGNOSIS — D1723 Benign lipomatous neoplasm of skin and subcutaneous tissue of right leg: Secondary | ICD-10-CM | POA: Diagnosis not present

## 2021-08-05 DIAGNOSIS — D225 Melanocytic nevi of trunk: Secondary | ICD-10-CM | POA: Diagnosis not present

## 2021-08-05 DIAGNOSIS — D2261 Melanocytic nevi of right upper limb, including shoulder: Secondary | ICD-10-CM | POA: Diagnosis not present

## 2021-08-05 DIAGNOSIS — I788 Other diseases of capillaries: Secondary | ICD-10-CM | POA: Diagnosis not present

## 2021-08-05 DIAGNOSIS — D1801 Hemangioma of skin and subcutaneous tissue: Secondary | ICD-10-CM | POA: Diagnosis not present

## 2021-08-09 ENCOUNTER — Encounter: Payer: Self-pay | Admitting: Internal Medicine

## 2021-08-09 ENCOUNTER — Other Ambulatory Visit: Payer: Self-pay | Admitting: Internal Medicine

## 2021-08-13 ENCOUNTER — Encounter: Payer: Self-pay | Admitting: Gastroenterology

## 2021-08-13 ENCOUNTER — Telehealth: Payer: Self-pay | Admitting: Pharmacy Technician

## 2021-08-13 ENCOUNTER — Ambulatory Visit: Payer: Medicare PPO | Admitting: Gastroenterology

## 2021-08-13 ENCOUNTER — Other Ambulatory Visit (INDEPENDENT_AMBULATORY_CARE_PROVIDER_SITE_OTHER): Payer: Medicare PPO

## 2021-08-13 ENCOUNTER — Other Ambulatory Visit: Payer: Self-pay

## 2021-08-13 VITALS — BP 124/50 | HR 64 | Ht 62.5 in | Wt 106.2 lb

## 2021-08-13 DIAGNOSIS — K5521 Angiodysplasia of colon with hemorrhage: Secondary | ICD-10-CM

## 2021-08-13 DIAGNOSIS — D500 Iron deficiency anemia secondary to blood loss (chronic): Secondary | ICD-10-CM

## 2021-08-13 DIAGNOSIS — J8410 Pulmonary fibrosis, unspecified: Secondary | ICD-10-CM

## 2021-08-13 DIAGNOSIS — J849 Interstitial pulmonary disease, unspecified: Secondary | ICD-10-CM

## 2021-08-13 DIAGNOSIS — D509 Iron deficiency anemia, unspecified: Secondary | ICD-10-CM

## 2021-08-13 DIAGNOSIS — R0602 Shortness of breath: Secondary | ICD-10-CM

## 2021-08-13 DIAGNOSIS — I3139 Other pericardial effusion (noninflammatory): Secondary | ICD-10-CM

## 2021-08-13 LAB — COMPREHENSIVE METABOLIC PANEL
ALT: 8 U/L (ref 0–35)
AST: 21 U/L (ref 0–37)
Albumin: 4.5 g/dL (ref 3.5–5.2)
Alkaline Phosphatase: 48 U/L (ref 39–117)
BUN: 42 mg/dL — ABNORMAL HIGH (ref 6–23)
CO2: 28 mEq/L (ref 19–32)
Calcium: 9.5 mg/dL (ref 8.4–10.5)
Chloride: 97 mEq/L (ref 96–112)
Creatinine, Ser: 1.67 mg/dL — ABNORMAL HIGH (ref 0.40–1.20)
GFR: 28.26 mL/min — ABNORMAL LOW (ref >60.00)
Glucose, Bld: 98 mg/dL (ref 70–99)
Potassium: 4.1 mEq/L (ref 3.5–5.1)
Sodium: 135 mEq/L (ref 135–145)
Total Bilirubin: 0.6 mg/dL (ref 0.2–1.2)
Total Protein: 7.1 g/dL (ref 6.0–8.3)

## 2021-08-13 LAB — CBC WITH DIFFERENTIAL/PLATELET
Basophils Absolute: 0.1 10*3/uL (ref 0.0–0.1)
Basophils Relative: 1.1 % (ref 0.0–3.0)
Eosinophils Absolute: 0.1 10*3/uL (ref 0.0–0.7)
Eosinophils Relative: 1.4 % (ref 0.0–5.0)
HCT: 29.4 % — ABNORMAL LOW (ref 36.0–46.0)
Hemoglobin: 9.7 g/dL — ABNORMAL LOW (ref 12.0–15.0)
Lymphocytes Relative: 21.4 % (ref 12.0–46.0)
Lymphs Abs: 1.4 10*3/uL (ref 0.7–4.0)
MCHC: 33 g/dL (ref 30.0–36.0)
MCV: 99.3 fl (ref 78.0–100.0)
Monocytes Absolute: 0.4 10*3/uL (ref 0.1–1.0)
Monocytes Relative: 6.5 % (ref 3.0–12.0)
Neutro Abs: 4.4 10*3/uL (ref 1.4–7.7)
Neutrophils Relative %: 69.6 % (ref 43.0–77.0)
Platelets: 266 10*3/uL (ref 150.0–400.0)
RBC: 2.96 Mil/uL — ABNORMAL LOW (ref 3.87–5.11)
RDW: 13.9 % (ref 11.5–15.5)
WBC: 6.3 10*3/uL (ref 4.0–10.5)

## 2021-08-13 LAB — IBC + FERRITIN
Ferritin: 37.5 ng/mL (ref 10.0–291.0)
Iron: 49 ug/dL (ref 42–145)
Saturation Ratios: 14.6 % — ABNORMAL LOW (ref 20.0–50.0)
TIBC: 336 ug/dL (ref 250.0–450.0)
Transferrin: 240 mg/dL (ref 212.0–360.0)

## 2021-08-13 NOTE — Telephone Encounter (Signed)
Ok, please check if had any reaction to Venofer in the past? If not ok to proceed. Thanks

## 2021-08-13 NOTE — Patient Instructions (Addendum)
Your provider has requested that you go to the basement level for lab work before leaving today. Press "B" on the elevator. The lab is located at the first door on the left as you exit the elevator.   Beth will contact you about your Iron Infusion   We will refer you to Dr Archie Patten or Dr Erin Fulling at Pulmonary  and they will contact you with that appointment  We will refer you to Dr Burt Knack at cardiology and they will contact you with that appointment  Due to recent changes in healthcare laws, you may see the results of your imaging and laboratory studies on MyChart before your provider has had a chance to review them.  We understand that in some cases there may be results that are confusing or concerning to you. Not all laboratory results come back in the same time frame and the provider may be waiting for multiple results in order to interpret others.  Please give [place] 48 hours in order for your provider to thoroughly review all the results before contacting the office for clarification of your results.    I appreciate the  opportunity to care for you  Thank You   Harl Bowie , MD

## 2021-08-13 NOTE — Telephone Encounter (Signed)
Dr. Silverio Decamp,  Auth Submission: Denied - Feraheme  Payer: Mcarthur Rossetti Medication & CPT/J Code(s) submitted: Feraheme (ferumoxytol) [id number] Route of submission (phone, fax, portal): phone [phone] Auth type: Buy/Bill Units/visits requested: 2 Reference number: [id number]  Denied due to patient has not tried and or failed step therapy. VENOFER FERRILICIT INFED  Would you like to try Venofer?  Please advise.

## 2021-08-13 NOTE — Progress Notes (Signed)
Paula Ward    [id number]    [birth year]  Primary Care Physician:Crawford, Real Cons, MD  Referring Physician: Hoyt Koch, MD [address],  [place] [address]   Chief complaint:  Anemia, change in bowel habits, fatigue  HPI:  85 year old very pleasant female with history of scleroderma, pulmonary hypertension, large pericardial effusion, small bowel AVM with chronic anemia here for follow-up visit   She was hospitalized most recently in June 2022 with severe anemia and melena  She has been off anticoagulation due to history of recurrent GI bleed  Small bowel enteroscopy on June 10 and April 13, 2021: Multiple AVMs in the stomach and small bowel treated with APC  Colonoscopy April 15, 2021: Left colon diverticulosis otherwise unremarkable exam   She continues to have shortness of breath and cough. She is followed by [facility] for pulmonary hypertension and ILD.  Her oxygen saturation was 93% on 6 minute walk test, has home O2. She was previously on Bactrim, CellCept and prednisone.  She is currently on macitentan and diltiazem. She has large pericardial effusion without tamponade physiology, is monitored by cardiology at [facility]  As she is aging she is finding it difficult to travel to [place] for follow-up pulmonary and cardiology appointments at [facility].  Patient was tearful that her condition is deteriorating  Weight has stabilized.  She is no longer losing weight.  Her appetite is normal. She is having thin caliber stool but denies any melena or rectal bleeding   CT abdomen and pelvis 04/04/2019: Large amount of stool through out the colon, colon diverticulosis otherwise no acute pathology Large pericardial effusion, limited imaging.   CT chest UIP pattern fibrosis   Colonoscopy July 12, 2014 by Dr. Olevia Perches: Sessile polyp tubular adenoma removed from ascending colon.  Diverticulosis       Outpatient Encounter  Medications as of 08/13/2021  Medication Sig   ACCU-CHEK AVIVA PLUS test strip    acetaminophen (TYLENOL) 325 MG tablet Take 650 mg by mouth every 6 (six) hours as needed for mild pain.    albuterol (VENTOLIN HFA) 108 (90 Base) MCG/ACT inhaler INHALE 2 PUFFS INTO THE LUNGS EVERY 6 HOURS AS NEEDED FOR WHEEZING OR SHORTNESS OF BREATH   ALPRAZolam (XANAX) 0.25 MG tablet Take 0.5 tablets (0.125 mg total) by mouth at bedtime as needed. (Patient taking differently: Take 0.125 mg by mouth at bedtime as needed for anxiety or sleep.)   atovaquone (MEPRON) 750 MG/5ML suspension Take 750 mg by mouth at bedtime.   diltiazem (CARDIZEM CD) 120 MG 24 hr capsule Take 120 mg by mouth daily.   fluticasone (FLONASE) 50 MCG/ACT nasal spray INSTILL 1 SPRAY INTO EACH NOSTRIL TWICE A DAY IF NEEDED (Patient taking differently: Place 1 spray into both nostrils daily as needed for allergies.)   furosemide (LASIX) 20 MG tablet Take 1 tablet by mouth once daily   meclizine (ANTIVERT) 12.5 MG tablet Take 1-2 tablets (12.5-25 mg total) by mouth 3 (three) times daily as needed for dizziness.   montelukast (SINGULAIR) 10 MG tablet TAKE 1 TABLET BY MOUTH AT BEDTIME   Mouthwashes (DRY MOUTH MOUTHWASH) LIQD Use as directed 5 mLs in the mouth or throat daily as needed (dry mouth). Biotene   Multiple Vitamins-Iron (MULTIVITAMIN/IRON PO) Take 1 tablet by mouth daily.   OPSUMIT 10 MG TABS Take 10 mg by mouth daily.   OXYGEN Inhale into the lungs. 2 LMP at night and upon  exertion   pantoprazole (PROTONIX) 40 MG tablet Take 1 tablet (40 mg total) by mouth daily.   spironolactone (ALDACTONE) 25 MG tablet Take 12.5 mg by mouth daily.   sucralfate (CARAFATE) 1 GM/10ML suspension Take ml's by mouth at bedtime   No facility-administered encounter medications on file as of 08/13/2021.    Allergies as of 08/13/2021 - Review Complete 08/13/2021  Allergen Reaction Noted   Codeine Nausea Only 08/03/2008   Other Nausea And Vomiting  04/05/2012   Erythromycin Nausea And Vomiting 10/17/2014   Lisinopril  12/14/2014   Mycophenolate mofetil Nausea Only 09/23/2019   Sulfa antibiotics Other (See Comments) 03/28/2021    Past Medical History:  Diagnosis Date   Anemia    Angiodysplasia of stomach and duodenum    Arthus phenomenon    AVM (arteriovenous malformation)    Blood transfusion without reported diagnosis    last 4 units 12-22-15, Iron infusion x2 last -01-07-16,01-14-16.   Breast cancer ([place]) 1989   Left   Candida esophagitis (HCC)    Cataract    Chronic kidney disease    Chronic mild renal insuffiency   CREST syndrome (HCC)    GERD (gastroesophageal reflux disease)    w/ HH   Interstitial lung disease (HCC)    Nodule of kidney    PONV (postoperative nausea and vomiting)    Pulmonary embolus ([place]) 2003   Pulmonary hypertension ([place])    followed by Dr Gaynell Face at [facility], now Dr. Maryjean Ka visit end 2'17.   Rectal incontinence    Renal cell carcinoma (HCC)    Scleroderma (HCC)    Tubular adenoma of colon    Uterine polyp     Past Surgical History:  Procedure Laterality Date   APPENDECTOMY  1962   BREAST LUMPECTOMY  1989   left   CATARACT EXTRACTION, BILATERAL Bilateral 12/2013   COLONOSCOPY WITH PROPOFOL N/A 04/15/2021   Procedure: COLONOSCOPY WITH PROPOFOL;  Surgeon: Milus Banister, MD;  Location: [facility];  Service: Endoscopy;  Laterality: N/A;   ENTEROSCOPY N/A 01/18/2016   Procedure: ENTEROSCOPY;  Surgeon: Mauri Pole, MD;  Location: [facility];  Service: Endoscopy;  Laterality: N/A;   ENTEROSCOPY N/A 05/24/2018   Procedure: ENTEROSCOPY;  Surgeon: Jackquline Denmark, MD;  Location: [facility];  Service: Endoscopy;  Laterality: N/A;   ENTEROSCOPY N/A 03/29/2021   Procedure: ENTEROSCOPY;  Surgeon: Jerene Bears, MD;  Location: [facility];  Service: Gastroenterology;  Laterality: N/A;   ENTEROSCOPY N/A 04/13/2021   Procedure: ENTEROSCOPY;  Surgeon: Yetta Flock, MD;  Location: [facility];  Service: Gastroenterology;  Laterality: N/A;   ESOPHAGOGASTRODUODENOSCOPY (EGD) WITH PROPOFOL N/A 12/21/2015   Procedure: ESOPHAGOGASTRODUODENOSCOPY (EGD) WITH PROPOFOL;  Surgeon: Irene Shipper, MD;  Location: [facility];  Service: Endoscopy;  Laterality: N/A;   HOT HEMOSTASIS N/A 05/24/2018   Procedure: HOT HEMOSTASIS (ARGON PLASMA COAGULATION/BICAP);  Surgeon: Jackquline Denmark, MD;  [facility];  Service: Endoscopy;  Laterality: N/A;   HOT HEMOSTASIS N/A 03/29/2021   Procedure: HOT HEMOSTASIS (ARGON PLASMA COAGULATION/BICAP);  Surgeon: Jerene Bears, MD;  Location: [facility];  Service: Gastroenterology;  Laterality: N/A;   HOT HEMOSTASIS N/A 04/13/2021   Procedure: HOT HEMOSTASIS (ARGON PLASMA COAGULATION/BICAP);  Surgeon: Yetta Flock, MD;  Location: [facility];  Service: Gastroenterology;  Laterality: N/A;   IR GENERIC HISTORICAL  06/05/2016   IR RADIOLOGIST EVAL & MGMT 06/05/2016 Aletta Edouard, MD [facility] INTERV RAD   IVC Filter     KIDNEY SURGERY  left -"laser surgery by Dr. Kathlene Cote- 5 yrs ago" no removal   SCHLEROTHERAPY  05/24/2018   Procedure: SCHLEROTHERAPY;  Surgeon: Jackquline Denmark, MD;  Location: [facility];  Service: Endoscopy;;   SUBMUCOSAL TATTOO INJECTION  04/13/2021   Procedure: SUBMUCOSAL TATTOO INJECTION;  Surgeon: Yetta Flock, MD;  Location: [facility];  Service: Gastroenterology;;   TONSILLECTOMY     TUBAL LIGATION      Family History  Problem Relation Age of Onset   Bladder Cancer Father    Diabetes Father    Prostate cancer Father    Alzheimer's disease Mother    Diabetes Sister    Lung cancer Sister         smoker   Esophageal cancer Paternal Uncle    Colon cancer Neg Hx     Social History   Socioeconomic History   Marital status: Married    Spouse name: Not on file   Number of children: 2   Years of education: Not on file   Highest education level: Not on file  Occupational History   Occupation: Retired   Tobacco Use   Smoking status: Never   Smokeless tobacco: Never  Vaping Use   Vaping Use: Never used  Substance and Sexual Activity   Alcohol use: No   Drug use: No   Sexual activity: Not Currently  Other Topics Concern   Not on file  Social History Narrative   Married '61   1 son- '65, 1 daughter '63; 6 children (2 adopted)   SO- SOB   Retirement- doing well   Marriage in good health   Patient has never smoked   Alcohol use- no   Pt gets regular exercise         Social Determinants of Radio broadcast assistant Strain: Not on file  Food Insecurity: Not on file  Transportation Needs: Not on file  Physical Activity: Not on file  Stress: Not on file  Social Connections: Not on file  Intimate Partner Violence: Not on file      Review of systems: All other review of systems negative except as mentioned in the HPI.   Physical Exam: Vitals:   08/13/21 1056  BP: (!) 124/50  Pulse: 64   Body mass index is 19.12 kg/m. Gen:      No acute distress HEENT:  sclera anicteric Ext:    No edema Neuro: alert and oriented x 3 Psych: normal mood and affect  Data Reviewed:  Reviewed labs, radiology imaging, old records and pertinent past GI work up   Assessment and Plan/Recommendations:  85 year old female with history of scleroderma /systemic sclerosis, interstitial lung disease/chronic parenchymal disease/pulmonary fibrosis, large pericardial effusion and pulmonary hypertension with recurrent GI bleed secondary to gastrointestinal AVMs  She has chronic iron deficiency anemia due to intermittent occult GI blood loss He has routine monitoring of CBC monthly through Dr. Nathanial Millman office  She has had multiple hospitalization due to recurrent GI bleed. We will try to appeal to insurance for approval of intramuscular injection monthly of long acting octreotide to decrease transfusion requirement and hospitalizations  Follow-up CBC, CMP and iron panel If she has  evidence of significant iron deficiency, will schedule for IV Feraheme infusion  Chronic kidney disease stage III, is followed by nephrology  Pulmonary fibrosis and pulmonary hypertension is followed by [facility], patient is interested in establishing care locally in [place].  We will send referral to [facility]  Large pericardial effusion without cardiac tamponade: Followed by  [facility] cardiology, patient wants to establish locally.  We will send referral to  Dr.Cooper   This visit required >40 minutes of patient care (this includes precharting, chart review, review of results, face-to-face time used for counseling as well as treatment plan and follow-up. The patient was provided an opportunity to ask questions and all were answered. The patient agreed with the plan and demonstrated an understanding of the instructions.  Damaris Hippo , MD    CC: Hoyt Koch, *

## 2021-08-14 ENCOUNTER — Other Ambulatory Visit: Payer: Self-pay | Admitting: Pharmacy Technician

## 2021-08-14 ENCOUNTER — Telehealth: Payer: Self-pay

## 2021-08-14 NOTE — Telephone Encounter (Signed)
PA for Octreotide injection started using Cover My Meds.

## 2021-08-16 ENCOUNTER — Ambulatory Visit (INDEPENDENT_AMBULATORY_CARE_PROVIDER_SITE_OTHER): Payer: Medicare PPO

## 2021-08-16 ENCOUNTER — Other Ambulatory Visit: Payer: Self-pay

## 2021-08-16 VITALS — BP 114/46 | HR 78 | Temp 98.0°F | Resp 16 | Ht 63.0 in | Wt 107.0 lb

## 2021-08-16 DIAGNOSIS — D638 Anemia in other chronic diseases classified elsewhere: Secondary | ICD-10-CM

## 2021-08-16 DIAGNOSIS — D509 Iron deficiency anemia, unspecified: Secondary | ICD-10-CM | POA: Diagnosis not present

## 2021-08-16 MED ORDER — METHYLPREDNISOLONE SODIUM SUCC 125 MG IJ SOLR: 125.0000 mg | Freq: Once | INTRAMUSCULAR | Status: DC | PRN

## 2021-08-16 MED ORDER — ACETAMINOPHEN 325 MG PO TABS
650.0000 mg | ORAL_TABLET | Freq: Once | ORAL | Status: AC
  Administered 2021-08-16: 650 mg via ORAL
  Filled 2021-08-16: qty 2

## 2021-08-16 MED ORDER — ALBUTEROL SULFATE HFA 108 (90 BASE) MCG/ACT IN AERS: 2.0000 | INHALATION_SPRAY | Freq: Once | RESPIRATORY_TRACT | Status: DC | PRN

## 2021-08-16 MED ORDER — SODIUM CHLORIDE 0.9 % IV SOLN: Freq: Once | INTRAVENOUS | Status: DC | PRN

## 2021-08-16 MED ORDER — SODIUM CHLORIDE 0.9 % IV SOLN
200.0000 mg | Freq: Once | INTRAVENOUS | Status: AC
  Administered 2021-08-16: 200 mg via INTRAVENOUS
  Filled 2021-08-16: qty 10

## 2021-08-16 MED ORDER — DIPHENHYDRAMINE HCL 50 MG/ML IJ SOLN: 50.0000 mg | Freq: Once | INTRAMUSCULAR | Status: DC | PRN

## 2021-08-16 MED ORDER — FAMOTIDINE IN NACL 20-0.9 MG/50ML-% IV SOLN: 20.0000 mg | Freq: Once | INTRAVENOUS | Status: DC | PRN

## 2021-08-16 MED ORDER — DIPHENHYDRAMINE HCL 25 MG PO CAPS
50.0000 mg | ORAL_CAPSULE | Freq: Once | ORAL | Status: AC
  Administered 2021-08-16: 50 mg via ORAL
  Filled 2021-08-16: qty 2

## 2021-08-16 MED ORDER — EPINEPHRINE 0.3 MG/0.3ML IJ SOAJ: 0.3000 mg | Freq: Once | INTRAMUSCULAR | Status: DC | PRN

## 2021-08-16 NOTE — Telephone Encounter (Signed)
Sandy from [place] called saying she had questions regarding preauthorization for patient's medication.  Please call [phone] and use Reference [id number].  Thank you.

## 2021-08-16 NOTE — Progress Notes (Signed)
Diagnosis: Iron Deficiency Anemia  Provider:  Marshell Garfinkel, MD  Procedure: Infusion  IV Type: Peripheral, IV Location: R Forearm  Venofer (Iron Sucrose), Dose: 200 mg  Infusion Start Time: 3428  Infusion Stop Time: 7681  Post Infusion IV Care: Observation period completed and Peripheral IV Discontinued  Discharge: Condition: Good, Destination: Home . AVS provided to patient.   Performed by:  Delvin Hedeen, Sherlon Handing, LPN

## 2021-08-19 ENCOUNTER — Ambulatory Visit (INDEPENDENT_AMBULATORY_CARE_PROVIDER_SITE_OTHER): Payer: Medicare PPO

## 2021-08-19 ENCOUNTER — Other Ambulatory Visit: Payer: Self-pay

## 2021-08-19 VITALS — BP 129/68 | HR 82 | Temp 98.4°F | Resp 16 | Ht 63.0 in | Wt 106.0 lb

## 2021-08-19 DIAGNOSIS — D509 Iron deficiency anemia, unspecified: Secondary | ICD-10-CM | POA: Diagnosis not present

## 2021-08-19 DIAGNOSIS — D638 Anemia in other chronic diseases classified elsewhere: Secondary | ICD-10-CM

## 2021-08-19 MED ORDER — ALTEPLASE 2 MG IJ SOLR: 2.0000 mg | Freq: Once | INTRAMUSCULAR | Status: DC | PRN

## 2021-08-19 MED ORDER — ACETAMINOPHEN 325 MG PO TABS: 650.0000 mg | ORAL_TABLET | Freq: Once | ORAL | Status: DC

## 2021-08-19 MED ORDER — HEPARIN SOD (PORK) LOCK FLUSH 100 UNIT/ML IV SOLN: 500.0000 [IU] | Freq: Once | INTRAVENOUS | Status: DC | PRN

## 2021-08-19 MED ORDER — DIPHENHYDRAMINE HCL 50 MG/ML IJ SOLN: 50.0000 mg | Freq: Once | INTRAMUSCULAR | Status: DC | PRN

## 2021-08-19 MED ORDER — SODIUM CHLORIDE 0.9 % IV SOLN
200.0000 mg | Freq: Once | INTRAVENOUS | Status: AC
  Administered 2021-08-19: 200 mg via INTRAVENOUS
  Filled 2021-08-19: qty 10

## 2021-08-19 MED ORDER — SODIUM CHLORIDE 0.9 % IV SOLN: Freq: Once | INTRAVENOUS | Status: DC | PRN

## 2021-08-19 MED ORDER — EPINEPHRINE 0.3 MG/0.3ML IJ SOAJ: 0.3000 mg | Freq: Once | INTRAMUSCULAR | Status: DC | PRN

## 2021-08-19 MED ORDER — SODIUM CHLORIDE 0.9% FLUSH: 10.0000 mL | Freq: Once | INTRAVENOUS | Status: DC | PRN

## 2021-08-19 MED ORDER — DIPHENHYDRAMINE HCL 25 MG PO CAPS: 50.0000 mg | ORAL_CAPSULE | Freq: Once | ORAL | Status: DC

## 2021-08-19 MED ORDER — SODIUM CHLORIDE 0.9% FLUSH: 3.0000 mL | Freq: Once | INTRAVENOUS | Status: DC | PRN

## 2021-08-19 MED ORDER — HEPARIN SOD (PORK) LOCK FLUSH 100 UNIT/ML IV SOLN: 250.0000 [IU] | Freq: Once | INTRAVENOUS | Status: DC | PRN

## 2021-08-19 MED ORDER — ANTICOAGULANT SODIUM CITRATE 4% (200MG/5ML) IV SOLN
5.0000 mL | Freq: Once | Status: DC | PRN
Start: 2021-08-19 — End: 2021-08-19
  Filled 2021-08-19: qty 5

## 2021-08-19 MED ORDER — METHYLPREDNISOLONE SODIUM SUCC 125 MG IJ SOLR: 125.0000 mg | Freq: Once | INTRAMUSCULAR | Status: DC | PRN

## 2021-08-19 MED ORDER — ALBUTEROL SULFATE HFA 108 (90 BASE) MCG/ACT IN AERS: 2.0000 | INHALATION_SPRAY | Freq: Once | RESPIRATORY_TRACT | Status: DC | PRN

## 2021-08-19 MED ORDER — FAMOTIDINE IN NACL 20-0.9 MG/50ML-% IV SOLN: 20.0000 mg | Freq: Once | INTRAVENOUS | Status: DC | PRN

## 2021-08-19 NOTE — Progress Notes (Signed)
Diagnosis: Iron Deficiency Anemia  Provider:  Marshell Garfinkel, MD  Procedure: Infusion  IV Type: Peripheral, IV Location: R Antecubital  Venofer (Iron Sucrose), Dose: 200 mg  Infusion Start Time: 3462  Infusion Stop Time: 1947  Post Infusion IV Care: Peripheral IV Discontinued  Discharge: Condition: Good, Destination: Home . AVS provided to patient.   Performed by:  Tewana Bohlen, Sherlon Handing, LPN

## 2021-08-21 ENCOUNTER — Other Ambulatory Visit: Payer: Self-pay

## 2021-08-21 ENCOUNTER — Ambulatory Visit (INDEPENDENT_AMBULATORY_CARE_PROVIDER_SITE_OTHER): Payer: Medicare PPO | Admitting: *Deleted

## 2021-08-21 VITALS — BP 123/50 | HR 73 | Temp 98.1°F | Resp 16 | Ht 63.0 in | Wt 108.2 lb

## 2021-08-21 DIAGNOSIS — K5521 Angiodysplasia of colon with hemorrhage: Secondary | ICD-10-CM

## 2021-08-21 DIAGNOSIS — D638 Anemia in other chronic diseases classified elsewhere: Secondary | ICD-10-CM

## 2021-08-21 DIAGNOSIS — D500 Iron deficiency anemia secondary to blood loss (chronic): Secondary | ICD-10-CM | POA: Diagnosis not present

## 2021-08-21 MED ORDER — FAMOTIDINE IN NACL 20-0.9 MG/50ML-% IV SOLN: 20.0000 mg | Freq: Once | INTRAVENOUS | Status: DC | PRN

## 2021-08-21 MED ORDER — METHYLPREDNISOLONE SODIUM SUCC 125 MG IJ SOLR: 125.0000 mg | Freq: Once | INTRAMUSCULAR | Status: DC | PRN

## 2021-08-21 MED ORDER — SODIUM CHLORIDE 0.9 % IV SOLN
200.0000 mg | Freq: Once | INTRAVENOUS | Status: AC
  Administered 2021-08-21: 200 mg via INTRAVENOUS
  Filled 2021-08-21: qty 10

## 2021-08-21 MED ORDER — DIPHENHYDRAMINE HCL 25 MG PO CAPS: 50.0000 mg | ORAL_CAPSULE | Freq: Once | ORAL | Status: DC

## 2021-08-21 MED ORDER — SODIUM CHLORIDE 0.9 % IV SOLN: Freq: Once | INTRAVENOUS | Status: DC | PRN

## 2021-08-21 MED ORDER — ACETAMINOPHEN 325 MG PO TABS: 650.0000 mg | ORAL_TABLET | Freq: Once | ORAL | Status: DC

## 2021-08-21 MED ORDER — ALBUTEROL SULFATE HFA 108 (90 BASE) MCG/ACT IN AERS: 2.0000 | INHALATION_SPRAY | Freq: Once | RESPIRATORY_TRACT | Status: DC | PRN

## 2021-08-21 MED ORDER — EPINEPHRINE 0.3 MG/0.3ML IJ SOAJ: 0.3000 mg | Freq: Once | INTRAMUSCULAR | Status: DC | PRN

## 2021-08-21 MED ORDER — DIPHENHYDRAMINE HCL 50 MG/ML IJ SOLN: 50.0000 mg | Freq: Once | INTRAMUSCULAR | Status: DC | PRN

## 2021-08-21 NOTE — Telephone Encounter (Signed)
Octreotide has been denied. "Deniedon October 28 You asked for the drug above for your Iron deficiency anemia secondary to blood loss (chronic)/Angiodysplasia of colon with hemorrhage. This is an off-label use that is not medically accepted. The Medicare rule in the Prescription Drug Benefit Manual (Chapter 6, Section 10.6) says a drug must be used for a medically accepted indication (covered use). Off-label use is medically accepted when there is proof in one or more of the drug guides that the drug works for your condition. We look at the two major drug guides (compendia): the [place] and the [place] (AHFS-DI). [employer] has decided that there is no proof in either drug guide that this drug works for your condition. Per Medicare rules, it is not covered."

## 2021-08-21 NOTE — Progress Notes (Signed)
Diagnosis: Iron Deficiency Anemia  Provider:  Marshell Garfinkel, MD  Procedure: Infusion  IV Type: Peripheral, IV Location: R Antecubital  Venofer (Iron Sucrose), Dose: 200 mg  Infusion Start Time: 1587 pm  Infusion Stop Time: 1456 pm  Post Infusion IV Care: Observation period completed  Discharge: Condition: Good, Destination: Home . AVS provided to patient.   Performed by:  Oren Beckmann, RN

## 2021-08-21 NOTE — Telephone Encounter (Signed)
Ok thank you for checking

## 2021-08-23 ENCOUNTER — Ambulatory Visit (INDEPENDENT_AMBULATORY_CARE_PROVIDER_SITE_OTHER): Payer: Medicare PPO | Admitting: *Deleted

## 2021-08-23 ENCOUNTER — Other Ambulatory Visit: Payer: Self-pay

## 2021-08-23 VITALS — BP 121/38 | HR 69 | Temp 98.0°F | Resp 18 | Wt 106.4 lb

## 2021-08-23 DIAGNOSIS — D500 Iron deficiency anemia secondary to blood loss (chronic): Secondary | ICD-10-CM

## 2021-08-23 DIAGNOSIS — K5521 Angiodysplasia of colon with hemorrhage: Secondary | ICD-10-CM | POA: Diagnosis not present

## 2021-08-23 DIAGNOSIS — D638 Anemia in other chronic diseases classified elsewhere: Secondary | ICD-10-CM

## 2021-08-23 MED ORDER — DIPHENHYDRAMINE HCL 25 MG PO CAPS: 50.0000 mg | ORAL_CAPSULE | Freq: Once | ORAL | Status: DC

## 2021-08-23 MED ORDER — ALBUTEROL SULFATE HFA 108 (90 BASE) MCG/ACT IN AERS: 2.0000 | INHALATION_SPRAY | Freq: Once | RESPIRATORY_TRACT | Status: DC | PRN

## 2021-08-23 MED ORDER — SODIUM CHLORIDE 0.9 % IV SOLN
200.0000 mg | Freq: Once | INTRAVENOUS | Status: AC
  Administered 2021-08-23: 200 mg via INTRAVENOUS
  Filled 2021-08-23: qty 10

## 2021-08-23 MED ORDER — ACETAMINOPHEN 325 MG PO TABS
650.0000 mg | ORAL_TABLET | Freq: Once | ORAL | Status: AC
  Administered 2021-08-23: 325 mg via ORAL
  Filled 2021-08-23: qty 2

## 2021-08-23 MED ORDER — FAMOTIDINE IN NACL 20-0.9 MG/50ML-% IV SOLN: 20.0000 mg | Freq: Once | INTRAVENOUS | Status: DC | PRN

## 2021-08-23 MED ORDER — EPINEPHRINE 0.3 MG/0.3ML IJ SOAJ: 0.3000 mg | Freq: Once | INTRAMUSCULAR | Status: DC | PRN

## 2021-08-23 MED ORDER — SODIUM CHLORIDE 0.9 % IV SOLN: Freq: Once | INTRAVENOUS | Status: DC | PRN

## 2021-08-23 MED ORDER — METHYLPREDNISOLONE SODIUM SUCC 125 MG IJ SOLR: 125.0000 mg | Freq: Once | INTRAMUSCULAR | Status: DC | PRN

## 2021-08-23 MED ORDER — DIPHENHYDRAMINE HCL 50 MG/ML IJ SOLN: 50.0000 mg | Freq: Once | INTRAMUSCULAR | Status: DC | PRN

## 2021-08-23 NOTE — Progress Notes (Signed)
Diagnosis: Iron Deficiency Anemia  Provider:  Marshell Garfinkel, MD  Procedure: Infusion  IV Type: Peripheral, IV Location: R Antecubital  Venofer (Iron Sucrose), Dose: 200 mg  Infusion Start Time: 0865  Infusion Stop Time: 7846  Post Infusion IV Care: Observation period completed and Peripheral IV Discontinued  Discharge: Condition: Good, Destination: Home . AVS provided to patient.   Performed by:  Ludwig Lean, RN

## 2021-08-26 ENCOUNTER — Telehealth: Payer: Self-pay

## 2021-08-26 ENCOUNTER — Ambulatory Visit: Payer: Medicare PPO

## 2021-08-26 NOTE — Telephone Encounter (Signed)
Unfortunately insurance doesn't want to pay for Ferra heme, she doe need the iron infusions (Venfer) to keep with chronic GI blodd loss from AVM's. This is different from Octreotide that was denied. Please encourage pt to continue IV iron infusion Thanks

## 2021-08-26 NOTE — Telephone Encounter (Signed)
Inbound call from patient. Have questions about insurance and infusion Octreotide

## 2021-08-26 NOTE — Telephone Encounter (Signed)
Called patient back and she states she received a letter from her Ins. Denying the Octreotide. She thought that was the Iron infusion she has been getting the last several days. I told her it was not, that the Iron Infusion is Venofer. (She has one left this week, but did not want to take it if she was going to have to pay for it). She also wanted Dr. Silverio Decamp to know these Iron Infusions leave her feeling very tired the day of and the day after. States this is different than how she used to feel after her Feraheme Infusions

## 2021-08-27 NOTE — Telephone Encounter (Signed)
Returned patient's call and let her know her Ins. Won't pay for the Feraheme Infusions anymore, but per Dr. Silverio Decamp, she really needs to finish the last Venofer Infusion. Patient agreed.

## 2021-08-27 NOTE — Telephone Encounter (Signed)
Patient is requesting to speak with a nurse regarding iron infusion.

## 2021-08-28 ENCOUNTER — Ambulatory Visit: Payer: Medicare PPO | Admitting: Pulmonary Disease

## 2021-08-28 ENCOUNTER — Other Ambulatory Visit: Payer: Self-pay

## 2021-08-28 ENCOUNTER — Encounter: Payer: Self-pay | Admitting: Pulmonary Disease

## 2021-08-28 ENCOUNTER — Ambulatory Visit (INDEPENDENT_AMBULATORY_CARE_PROVIDER_SITE_OTHER): Payer: Medicare PPO

## 2021-08-28 VITALS — BP 122/68 | HR 74 | Ht 63.0 in | Wt 106.3 lb

## 2021-08-28 VITALS — BP 118/63 | HR 66 | Temp 98.0°F | Resp 16 | Wt 106.2 lb

## 2021-08-28 DIAGNOSIS — K5521 Angiodysplasia of colon with hemorrhage: Secondary | ICD-10-CM | POA: Diagnosis not present

## 2021-08-28 DIAGNOSIS — D638 Anemia in other chronic diseases classified elsewhere: Secondary | ICD-10-CM

## 2021-08-28 DIAGNOSIS — D500 Iron deficiency anemia secondary to blood loss (chronic): Secondary | ICD-10-CM | POA: Diagnosis not present

## 2021-08-28 DIAGNOSIS — I272 Pulmonary hypertension, unspecified: Secondary | ICD-10-CM | POA: Diagnosis not present

## 2021-08-28 DIAGNOSIS — J849 Interstitial pulmonary disease, unspecified: Secondary | ICD-10-CM

## 2021-08-28 DIAGNOSIS — M349 Systemic sclerosis, unspecified: Secondary | ICD-10-CM | POA: Diagnosis not present

## 2021-08-28 MED ORDER — EPINEPHRINE 0.3 MG/0.3ML IJ SOAJ: 0.3000 mg | Freq: Once | INTRAMUSCULAR | Status: DC | PRN

## 2021-08-28 MED ORDER — ALBUTEROL SULFATE HFA 108 (90 BASE) MCG/ACT IN AERS: 2.0000 | INHALATION_SPRAY | Freq: Once | RESPIRATORY_TRACT | Status: DC | PRN

## 2021-08-28 MED ORDER — DIPHENHYDRAMINE HCL 50 MG/ML IJ SOLN: 50.0000 mg | Freq: Once | INTRAMUSCULAR | Status: DC | PRN

## 2021-08-28 MED ORDER — DIPHENHYDRAMINE HCL 25 MG PO CAPS: 50.0000 mg | ORAL_CAPSULE | Freq: Once | ORAL | Status: DC

## 2021-08-28 MED ORDER — FAMOTIDINE IN NACL 20-0.9 MG/50ML-% IV SOLN: 20.0000 mg | Freq: Once | INTRAVENOUS | Status: DC | PRN

## 2021-08-28 MED ORDER — ACETAMINOPHEN 325 MG PO TABS
650.0000 mg | ORAL_TABLET | Freq: Once | ORAL | Status: AC
  Administered 2021-08-28: 325 mg via ORAL
  Filled 2021-08-28: qty 2

## 2021-08-28 MED ORDER — SODIUM CHLORIDE 0.9 % IV SOLN
200.0000 mg | Freq: Once | INTRAVENOUS | Status: AC
  Administered 2021-08-28: 200 mg via INTRAVENOUS
  Filled 2021-08-28: qty 10

## 2021-08-28 MED ORDER — METHYLPREDNISOLONE SODIUM SUCC 125 MG IJ SOLR: 125.0000 mg | Freq: Once | INTRAMUSCULAR | Status: DC | PRN

## 2021-08-28 MED ORDER — SODIUM CHLORIDE 0.9 % IV SOLN: Freq: Once | INTRAVENOUS | Status: DC | PRN

## 2021-08-28 NOTE — Progress Notes (Signed)
Synopsis: Referred in November 2022 for ILD by Harl Bowie, MD  Subjective:   PATIENT ID: Paula Ward GENDER: female DOB: 02-Mar-1936, MRN: [id number]  HPI  Chief Complaint  Patient presents with   Consult    Referred by GI for ILD and PH. Was going to [place] for treatment but wanted to find care closer to home.    Paula Ward is an 85 year old woman, never smoker with history of pulmonary hypertension, AVMs of small bowel, chronic blood loss anemia, CKD, scleroderma who is referred to pulmonary clinic for ILD.   She has been followed by Dr. Lavonda Jumbo for ILD and Dr. Christa See for pulmonary hypertension related to her scleroderma at [facility].  She has been referred to our pulmonary clinic to establish care as it is getting more difficult for her to travel to [place].  At this time she would like to continue care at [facility] but also be established with our clinic in case of an emergency.  She reports she has progressive shortness of breath especially with exertion.  She was previously on immunosuppression for treatment of her interstitial lung disease related to scleroderma but she did not tolerate this treatment which was discontinued due to side effects.  Antifibrotic therapy was discussed on her previous visits but has not been initiated due to the side effect profile.  She remains on macitentan for her pulmonary hypertension.  She was previously on supplemental oxygen therapy but based on her 6-minute walk tests at [facility] she was able to be weaned off oxygen at rest and with exertion.  She denies any chest pain, syncopal episodes or lower extremity edema.  She is a never smoker.  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  She lives at home with her husband.  Past Medical History:  Diagnosis Date   Anemia    Angiodysplasia of stomach and duodenum    Arthus  phenomenon    AVM (arteriovenous malformation)    Blood transfusion without reported diagnosis    last 4 units 12-22-15, Iron infusion x2 last -01-07-16,01-14-16.   Breast cancer ([place]) 1989   Left   Candida esophagitis (HCC)    Cataract    Chronic kidney disease    Chronic mild renal insuffiency   CREST syndrome (HCC)    GERD (gastroesophageal reflux disease)    w/ HH   Interstitial lung disease (HCC)    Nodule of kidney    PONV (postoperative nausea and vomiting)    Pulmonary embolus ([place]) 2003   Pulmonary hypertension ([place])    followed by Dr Gaynell Face at [facility], now Dr. Maryjean Ka visit end 2'17.   Rectal incontinence    Renal cell carcinoma (HCC)    Scleroderma (HCC)    Tubular adenoma of colon    Uterine polyp      Family History  Problem Relation Age of Onset   Bladder Cancer Father    Diabetes Father    Prostate cancer Father    Alzheimer's disease Mother    Diabetes Sister    Lung cancer Sister         smoker   Esophageal cancer Paternal Uncle    Colon cancer Neg Hx      Social History   Socioeconomic History   Marital status: Married    Spouse name: Not on file   Number of children: 2   Years of education:  Not on file   Highest education level: Not on file  Occupational History   Occupation: Retired  Tobacco Use   Smoking status: Never   Smokeless tobacco: Never  Vaping Use   Vaping Use: Never used  Substance and Sexual Activity   Alcohol use: No   Drug use: No   Sexual activity: Not Currently  Other Topics Concern   Not on file  Social History Narrative   Married '61   1 son- '65, 1 daughter '63; 6 children (2 adopted)   SO- SOB   Retirement- doing well   Marriage in good health   Patient has never smoked   Alcohol use- no   Pt gets regular exercise         Social Determinants of Radio broadcast assistant Strain: Not on file  Food Insecurity: Not on file  Transportation Needs: Not on file  Physical Activity: Not on file  Stress: Not  on file  Social Connections: Not on file  Intimate Partner Violence: Not on file     Allergies  Allergen Reactions   Codeine Nausea Only    Hallucinations, too   Other Nausea And Vomiting    "-mycin" antibiotics.   Also cause hallucinations.   Erythromycin Nausea And Vomiting   Lisinopril     Pt doesn't remember    Mycophenolate Mofetil Nausea Only   Sulfa Antibiotics Other (See Comments)    unknown     Outpatient Medications Prior to Visit  Medication Sig Dispense Refill   ACCU-CHEK AVIVA PLUS test strip      acetaminophen (TYLENOL) 325 MG tablet Take 650 mg by mouth every 6 (six) hours as needed for mild pain.      albuterol (VENTOLIN HFA) 108 (90 Base) MCG/ACT inhaler INHALE 2 PUFFS INTO THE LUNGS EVERY 6 HOURS AS NEEDED FOR WHEEZING OR SHORTNESS OF BREATH 6.7 g 3   ALPRAZolam (XANAX) 0.25 MG tablet Take 0.5 tablets (0.125 mg total) by mouth at bedtime as needed. (Patient taking differently: Take 0.125 mg by mouth at bedtime as needed for anxiety or sleep.) 30 tablet 3   atovaquone (MEPRON) 750 MG/5ML suspension Take 750 mg by mouth at bedtime.     diltiazem (CARDIZEM CD) 120 MG 24 hr capsule Take 120 mg by mouth daily.     fluticasone (FLONASE) 50 MCG/ACT nasal spray INSTILL 1 SPRAY INTO EACH NOSTRIL TWICE A DAY IF NEEDED (Patient taking differently: Place 1 spray into both nostrils daily as needed for allergies.) 48 g 3   furosemide (LASIX) 20 MG tablet Take 1 tablet by mouth once daily 90 tablet 1   meclizine (ANTIVERT) 12.5 MG tablet Take 1-2 tablets (12.5-25 mg total) by mouth 3 (three) times daily as needed for dizziness. 30 tablet 1   montelukast (SINGULAIR) 10 MG tablet TAKE 1 TABLET BY MOUTH AT BEDTIME 30 tablet 0   Mouthwashes (DRY MOUTH MOUTHWASH) LIQD Use as directed 5 mLs in the mouth or throat daily as needed (dry mouth). Biotene     Multiple Vitamins-Iron (MULTIVITAMIN/IRON PO) Take 1 tablet by mouth daily.     OPSUMIT 10 MG TABS Take 10 mg by mouth daily.      OXYGEN Inhale into the lungs. 2 LMP at night and upon exertion     pantoprazole (PROTONIX) 40 MG tablet Take 1 tablet (40 mg total) by mouth daily. 30 tablet 1   spironolactone (ALDACTONE) 25 MG tablet Take 12.5 mg by mouth daily.     sucralfate (  CARAFATE) 1 GM/10ML suspension Take ml's by mouth at bedtime 900 mL 3   Facility-Administered Medications Prior to Visit  Medication Dose Route Frequency Provider Last Rate Last Admin   0.9 %  sodium chloride infusion   Intravenous Once PRN Tresa Res, RPH       albuterol (VENTOLIN HFA) 108 (90 Base) MCG/ACT inhaler 2 puff  2 puff Inhalation Once PRN Tresa Res, RPH       diphenhydrAMINE (BENADRYL) capsule 50 mg  50 mg Oral Once Tresa Res, RPH       diphenhydrAMINE (BENADRYL) injection 50 mg  50 mg Intravenous Once PRN Tresa Res, RPH       EPINEPHrine (EPI-PEN) injection 0.3 mg  0.3 mg Intramuscular Once PRN Tresa Res, RPH       famotidine (PEPCID) IVPB 20 mg premix  20 mg Intravenous Once PRN Tresa Res, RPH       methylPREDNISolone sodium succinate (SOLU-MEDROL) 125 mg/2 mL injection 125 mg  125 mg Intravenous Once PRN Tresa Res, RPH        Review of Systems  Constitutional:  Negative for chills, fever, malaise/fatigue and weight loss.  HENT:  Negative for congestion, sinus pain and sore throat.   Eyes: Negative.   Respiratory:  Positive for shortness of breath. Negative for cough, hemoptysis, sputum production and wheezing.   Cardiovascular:  Negative for chest pain, palpitations, orthopnea, claudication and leg swelling.  Gastrointestinal:  Negative for abdominal pain, heartburn, nausea and vomiting.  Genitourinary: Negative.   Musculoskeletal:  Negative for joint pain and myalgias.  Skin:  Negative for rash.  Neurological:  Negative for weakness.  Endo/Heme/Allergies: Negative.   Psychiatric/Behavioral: Negative.     Objective:   Vitals:   08/28/21 1429  BP: 122/68  Pulse: 74  SpO2: 95%  Weight: 106 lb 4.8 oz (48.2  kg)  Height: _0  (1.6 m)     Physical Exam Constitutional:      General: She is not in acute distress.    Appearance: She is not ill-appearing.  HENT:     Head: Normocephalic and atraumatic.     Mouth/Throat:     Mouth: Mucous membranes are moist.     Pharynx: Oropharynx is clear.  Eyes:     General: No scleral icterus.    Conjunctiva/sclera: Conjunctivae normal.     Pupils: Pupils are equal, round, and reactive to light.  Cardiovascular:     Rate and Rhythm: Normal rate and regular rhythm.     Pulses: Normal pulses.     Heart sounds: Murmur (systolic) heard.  Pulmonary:     Effort: Pulmonary effort is normal.     Breath sounds: Normal breath sounds. No wheezing, rhonchi or rales.  Abdominal:     General: Bowel sounds are normal.     Palpations: Abdomen is soft.  Musculoskeletal:     Right lower leg: No edema.     Left lower leg: No edema.  Lymphadenopathy:     Cervical: No cervical adenopathy.  Skin:    General: Skin is warm and dry.  Neurological:     General: No focal deficit present.     Mental Status: She is alert.  Psychiatric:        Mood and Affect: Mood normal.        Behavior: Behavior normal.        Thought Content: Thought content normal.        Judgment: Judgment normal.    CBC    Component Value  Date/Time   WBC 6.3 08/13/2021 1210   RBC 2.96 (L) 08/13/2021 1210   HGB 9.7 (L) 08/13/2021 1210   HCT 29.4 (L) 08/13/2021 1210   PLT 266.0 08/13/2021 1210   MCV 99.3 08/13/2021 1210   MCH 32.0 04/15/2021 0457   MCHC 33.0 08/13/2021 1210   RDW 13.9 08/13/2021 1210   LYMPHSABS 1.4 08/13/2021 1210   MONOABS 0.4 08/13/2021 1210   EOSABS 0.1 08/13/2021 1210   BASOSABS 0.1 08/13/2021 1210   Chest imaging: CT Chest 03/30/21 Cardiovascular: Large pericardial effusion. Cardiomegaly. Atherosclerotic calcifications of the aorta. Aortic valve calcifications.   Mediastinum/Nodes: Thyroid is unremarkable. No axillary adenopathy. Status post LEFT axillary  node sampling. No definitive mediastinal adenopathy.   Lungs/Pleura: Small to moderate LEFT pleural effusion. Trace RIGHT pleural effusion. Subpleural reticulation bilaterally. Anteriorly located subpleural reticulation of the LEFT breast likely reflecting sequela of radiation related changes. Irregular RIGHT apical pulmonary nodule versus scar measures 6 x 3 mm (series 7, image 27). Several adjacent RIGHT middle lobe pulmonary nodules each measure approximately 4 mm (series 7, image 104). LEFT lower lobe consolidative opacity, nonspecific. RIGHT basilar linear opacity most consistent with atelectasis.  HRCT Chest 04/12/2019 1. Findings suggestive of a probable UIP pattern fibrosis, which appears  similar to slightly progressed since 2012 and is likely related to the  patient's known underlying connective tissue disorder.  2. Small pulmonary nodules measuring up to 6 mm. Recommend 12 month follow  up chest CT to assess stability, per 2017 Fleischner criteria.  3, Large pericardial effusion. This finding is known per clinic note dated  12/23/2018 and has been documented on a recent echo.   PFT: No flowsheet data found.  PFT 02/28/21 FEV1/FVC 72%, FEV 1.03 (57%), FVC 1.42L (59%), DLCO 41%  PFT 03/01/2020 FEV1/FVC 73%, FEV1 1.25L (65%), FVC 1.25L (68%), DLCO 37.3%  6MWT 08/2020 Six minute walk distance is within the normal predicted range.  Oxygenation during exercise was adequate (SpO2 =>90%) without the use of supplemental O2. At peak exercise, the heart rate indicated cardiovascular maximums were being approached.  Perceived dyspnea at end of exercise was absent or mild.  Labs:  Path:  Echo 02/28/21: NORMAL LEFT VENTRICULAR SYSTOLIC FUNCTION WITH MILD LVH    NORMAL RIGHT VENTRICULAR SYSTOLIC FUNCTION    VALVULAR REGURGITATION: TRIVIAL MR, MILD TR    VALVULAR STENOSIS: MODERATE AS, MILD MS    LARGE PERICARDIAL EFFUSION (See above)   Heart Catheterization: Cardiac  catheterization was 02/07/08. It looked quite good with a PAP of 35/11 (mean 22), mean RAP of 3, PVR = 1.7 wood units, cardiac index = 3.69 L/min/m2, PCWP of 11. Cardiac catheterization in September 2004 prior to therapy revealed an RA pressure was 5, PA pressure 60/15 with a mean of 35, cardiac index 3.2, and PVR 5. The next cath on 10/03/2003, which was performed on iloprost / placebo, revealed a RA pressure of 2, PA pressure of 55/17 with a mean of 32, PCWP of 5, cardiac index of 3.1-3.7 and PVR between 3 and 4.    Assessment & Plan:   ILD (interstitial lung disease) ([place])  Scleroderma ([place])  Pulmonary hypertension ([place])  Discussion: Paula Ward is an 85 year old woman, never smoker with history of pulmonary hypertension, AVMs of small bowel, chronic blood loss anemia, CKD, scleroderma who is referred to pulmonary clinic for ILD.   She is currently followed at the [facility] pulmonary clinic by Dr. Lavonda Jumbo for ILD and Dr. Christa See for pulmonary hypertension.  She is establishing  care at our clinic in case of any emergency regarding her health as she has local providers who are familiar with her.  She will remain on macitentan for her pulmonary hypertension at this time.  She does appear to have progressive fibrotic changes based on her CT chest imaging and she also has a progressive decline in her pulmonary function tests.  Antifibrotic therapy has been discussed previously at her visits at [facility] and not initiated due to the side effect profile.  She is not currently requiring supplemental oxygen at this time.  We will continue to monitor alongside her providers at [facility] at this time.  No changes to her current management.  We can consider the possibility of Tyvaso therapy in the future given her ILD and pulmonary hypertension.  Follow-up in 6 months with PFTs.  Greater than 60 minutes was spent on this visit which included records review, direct patient care, and documentation.  Freda Jackson, MD [facility] Office: [phone]    Current Outpatient Medications:    ACCU-CHEK AVIVA PLUS test strip, , Disp: , Rfl:    acetaminophen (TYLENOL) 325 MG tablet, Take 650 mg by mouth every 6 (six) hours as needed for mild pain. , Disp: , Rfl:    albuterol (VENTOLIN HFA) 108 (90 Base) MCG/ACT inhaler, INHALE 2 PUFFS INTO THE LUNGS EVERY 6 HOURS AS NEEDED FOR WHEEZING OR SHORTNESS OF BREATH, Disp: 6.7 g, Rfl: 3   ALPRAZolam (XANAX) 0.25 MG tablet, Take 0.5 tablets (0.125 mg total) by mouth at bedtime as needed. (Patient taking differently: Take 0.125 mg by mouth at bedtime as needed for anxiety or sleep.), Disp: 30 tablet, Rfl: 3   atovaquone (MEPRON) 750 MG/5ML suspension, Take 750 mg by mouth at bedtime., Disp: , Rfl:    diltiazem (CARDIZEM CD) 120 MG 24 hr capsule, Take 120 mg by mouth daily., Disp: , Rfl:    fluticasone (FLONASE) 50 MCG/ACT nasal spray, INSTILL 1 SPRAY INTO EACH NOSTRIL TWICE A DAY IF NEEDED (Patient taking differently: Place 1 spray into both nostrils daily as needed for allergies.), Disp: 48 g, Rfl: 3   furosemide (LASIX) 20 MG tablet, Take 1 tablet by mouth once daily, Disp: 90 tablet, Rfl: 1   meclizine (ANTIVERT) 12.5 MG tablet, Take 1-2 tablets (12.5-25 mg total) by mouth 3 (three) times daily as needed for dizziness., Disp: 30 tablet, Rfl: 1   montelukast (SINGULAIR) 10 MG tablet, TAKE 1 TABLET BY MOUTH AT BEDTIME, Disp: 30 tablet, Rfl: 0   Mouthwashes (DRY MOUTH MOUTHWASH) LIQD, Use as directed 5 mLs in the mouth or throat daily as needed (dry mouth). Biotene, Disp: , Rfl:    Multiple Vitamins-Iron (MULTIVITAMIN/IRON PO), Take 1 tablet by mouth daily., Disp: , Rfl:    OPSUMIT 10 MG TABS, Take 10 mg by mouth daily., Disp: , Rfl:    OXYGEN, Inhale into the lungs. 2 LMP at night and upon exertion, Disp: , Rfl:    pantoprazole (PROTONIX) 40 MG tablet, Take 1 tablet (40 mg total) by mouth daily., Disp: 30 tablet, Rfl: 1   spironolactone  (ALDACTONE) 25 MG tablet, Take 12.5 mg by mouth daily., Disp: , Rfl:    sucralfate (CARAFATE) 1 GM/10ML suspension, Take ml's by mouth at bedtime, Disp: 900 mL, Rfl: 3 No current facility-administered medications for this visit.  Facility-Administered Medications Ordered in Other Visits:    0.9 %  sodium chloride infusion, , Intravenous, Once PRN, Tresa Res, RPH   albuterol (VENTOLIN HFA) 108 (  90 Base) MCG/ACT inhaler 2 puff, 2 puff, Inhalation, Once PRN, Tresa Res, RPH   diphenhydrAMINE (BENADRYL) capsule 50 mg, 50 mg, Oral, Once, Tresa Res, RPH   diphenhydrAMINE (BENADRYL) injection 50 mg, 50 mg, Intravenous, Once PRN, Tresa Res, RPH   EPINEPHrine (EPI-PEN) injection 0.3 mg, 0.3 mg, Intramuscular, Once PRN, Tresa Res, RPH   famotidine (PEPCID) IVPB 20 mg premix, 20 mg, Intravenous, Once PRN, Tresa Res, RPH   methylPREDNISolone sodium succinate (SOLU-MEDROL) 125 mg/2 mL injection 125 mg, 125 mg, Intravenous, Once PRN, Tresa Res, [facility]

## 2021-08-28 NOTE — Patient Instructions (Addendum)
Continue taking opsumit 44m daily for pulmonary hypertension  [facility] anyway.  I will look to see if we have any clinical trials to enroll you in for your scleroderma interstitial lung disease  We will schedule you for follow up in 6 months with pulmonary function tests

## 2021-08-28 NOTE — Progress Notes (Signed)
Diagnosis: Anemia  Provider:  Marshell Garfinkel, MD  Procedure: Infusion  IV Type: Peripheral, IV Location: L Hand  Venofer (Iron Sucrose), Dose: 200 mg  Infusion Start Time: 3524  Infusion Stop Time: 1410  Post Infusion IV Care: Observation period completed and Peripheral IV Discontinued  Discharge: Condition: Good, Destination: Home . AVS provided to patient.   Performed by:  Charlie Pitter, RN

## 2021-09-01 DIAGNOSIS — I5020 Unspecified systolic (congestive) heart failure: Secondary | ICD-10-CM | POA: Diagnosis not present

## 2021-09-04 ENCOUNTER — Other Ambulatory Visit: Payer: Self-pay | Admitting: Pharmacy Technician

## 2021-09-06 ENCOUNTER — Encounter: Payer: Self-pay | Admitting: Pulmonary Disease

## 2021-09-16 ENCOUNTER — Other Ambulatory Visit (INDEPENDENT_AMBULATORY_CARE_PROVIDER_SITE_OTHER): Payer: Medicare PPO

## 2021-09-16 DIAGNOSIS — N1831 Chronic kidney disease, stage 3a: Secondary | ICD-10-CM

## 2021-09-16 LAB — COMPREHENSIVE METABOLIC PANEL
ALT: 9 U/L (ref 0–35)
AST: 21 U/L (ref 0–37)
Albumin: 4.3 g/dL (ref 3.5–5.2)
Alkaline Phosphatase: 51 U/L (ref 39–117)
BUN: 33 mg/dL — ABNORMAL HIGH (ref 6–23)
CO2: 29 mEq/L (ref 19–32)
Calcium: 9.4 mg/dL (ref 8.4–10.5)
Chloride: 100 mEq/L (ref 96–112)
Creatinine, Ser: 1.44 mg/dL — ABNORMAL HIGH (ref 0.40–1.20)
GFR: 33.74 mL/min — ABNORMAL LOW (ref >60.00)
Glucose, Bld: 94 mg/dL (ref 70–99)
Potassium: 4.2 mEq/L (ref 3.5–5.1)
Sodium: 138 mEq/L (ref 135–145)
Total Bilirubin: 0.5 mg/dL (ref 0.2–1.2)
Total Protein: 6.8 g/dL (ref 6.0–8.3)

## 2021-09-16 LAB — CBC WITH DIFFERENTIAL/PLATELET
Basophils Absolute: 0.1 10*3/uL (ref 0.0–0.1)
Basophils Relative: 1 % (ref 0.0–3.0)
Eosinophils Absolute: 0.1 10*3/uL (ref 0.0–0.7)
Eosinophils Relative: 1.1 % (ref 0.0–5.0)
HCT: 29.3 % — ABNORMAL LOW (ref 36.0–46.0)
Hemoglobin: 9.7 g/dL — ABNORMAL LOW (ref 12.0–15.0)
Lymphocytes Relative: 21.6 % (ref 12.0–46.0)
Lymphs Abs: 1.2 10*3/uL (ref 0.7–4.0)
MCHC: 33.2 g/dL (ref 30.0–36.0)
MCV: 101.8 fl — ABNORMAL HIGH (ref 78.0–100.0)
Monocytes Absolute: 0.4 10*3/uL (ref 0.1–1.0)
Monocytes Relative: 6.6 % (ref 3.0–12.0)
Neutro Abs: 3.8 10*3/uL (ref 1.4–7.7)
Neutrophils Relative %: 69.7 % (ref 43.0–77.0)
Platelets: 209 10*3/uL (ref 150.0–400.0)
RBC: 2.88 Mil/uL — ABNORMAL LOW (ref 3.87–5.11)
RDW: 15.4 % (ref 11.5–15.5)
WBC: 5.4 10*3/uL (ref 4.0–10.5)

## 2021-09-17 NOTE — Progress Notes (Signed)
Diagnosis: Anemia  Provider:  Marshell Garfinkel, MD  Procedure: Infusion  IV Type: Peripheral, IV Location: L Hand  Venofer (Iron Sucrose), Dose: 200 mg  Infusion Start Time: 0626  Infusion Stop Time: 1410  Post Infusion IV Care: Observation period completed and Peripheral IV Discontinued  Discharge: Condition: Good, Destination: Home . AVS provided to patient.   Performed by:  Koren Shiver, RN

## 2021-09-19 DIAGNOSIS — I2729 Other secondary pulmonary hypertension: Secondary | ICD-10-CM | POA: Diagnosis not present

## 2021-09-19 DIAGNOSIS — J90 Pleural effusion, not elsewhere classified: Secondary | ICD-10-CM | POA: Diagnosis not present

## 2021-09-19 DIAGNOSIS — J849 Interstitial pulmonary disease, unspecified: Secondary | ICD-10-CM | POA: Diagnosis not present

## 2021-09-19 DIAGNOSIS — R0609 Other forms of dyspnea: Secondary | ICD-10-CM | POA: Diagnosis not present

## 2021-09-19 DIAGNOSIS — M349 Systemic sclerosis, unspecified: Secondary | ICD-10-CM | POA: Diagnosis not present

## 2021-09-19 DIAGNOSIS — R0602 Shortness of breath: Secondary | ICD-10-CM | POA: Diagnosis not present

## 2021-09-19 DIAGNOSIS — J984 Other disorders of lung: Secondary | ICD-10-CM | POA: Diagnosis not present

## 2021-09-19 DIAGNOSIS — I3139 Other pericardial effusion (noninflammatory): Secondary | ICD-10-CM | POA: Diagnosis not present

## 2021-09-19 DIAGNOSIS — R5383 Other fatigue: Secondary | ICD-10-CM | POA: Diagnosis not present

## 2021-09-26 ENCOUNTER — Other Ambulatory Visit: Payer: Self-pay | Admitting: Internal Medicine

## 2021-09-30 ENCOUNTER — Telehealth (HOSPITAL_COMMUNITY): Payer: Self-pay

## 2021-09-30 NOTE — Telephone Encounter (Signed)
Called patient to see if she is interested in the Pulmonary Rehab Program. Patient expressed interest. Explained scheduling process and went over insurance, patient verbalized understanding. Will pass referral RN for review.

## 2021-09-30 NOTE — Telephone Encounter (Signed)
Outside/paper referral received by Dr. Lavonda Jumbo from [facility]. Will fax over Physician order and request further documents. Insurance benefits and eligibility to be determined.

## 2021-09-30 NOTE — Progress Notes (Deleted)
Paula Pilot, MD Reason for referral-pericardial effusion  HPI: 85 yo female for evaluation of pericardial effusion at request of Harl Bowie MD. I have seen previously but not since 2017.  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  Patient noted to have large pericardial effusion as far back as 2003.  Cardiac catheterization in 2003 showed normal coronary arteries, PA pressure of 79/35.  Patient was seen by cardiothoracic surgery in September 2017 and pericardial window recommended (she does have a history of renal cell carcinoma, breast cancer but effusion is felt likely secondary to autoimmune disease).  Echo [facility] May 2022 showed normal LV function, mild left ventricular hypertrophy, moderate aortic stenosis with mean gradient 27 mmHg, mild mitral stenosis with mean gradient 6 mmHg and large pericardial effusion.  Chest CT at [facility] December 2022 showed interstitial lung disease and large pericardial fusion unchanged.  Patient has been followed for exertional dyspnea/pulmonary hypertension at [facility].  She has pulmonary fibrosis felt related to her autoimmune disease and is maintained on low-dose prednisone.  She did not tolerate CellCept or azathioprine.  She apparently is scheduled to see Dr. Gilles Chiquito at [facility] in January.  Note she had lab work drawn December 2022 and proBNP 6986; BUN 34 and creatinine 1.7.  Cardiology asked to evaluate for pericardial effusion.  Current Outpatient Medications  Medication Sig Dispense Refill   ACCU-CHEK AVIVA PLUS test strip      acetaminophen (TYLENOL) 325 MG tablet Take 650 mg by mouth every 6 (six) hours as needed for mild pain.      albuterol (VENTOLIN HFA) 108 (90 Base) MCG/ACT inhaler INHALE 2 PUFFS INTO THE LUNGS EVERY 6 HOURS AS NEEDED FOR WHEEZING OR SHORTNESS OF BREATH 6.7 g 3   ALPRAZolam (XANAX) 0.25 MG tablet Take 0.5 tablets  (0.125 mg total) by mouth at bedtime as needed. (Patient taking differently: Take 0.125 mg by mouth at bedtime as needed for anxiety or sleep.) 30 tablet 3   atovaquone (MEPRON) 750 MG/5ML suspension Take 750 mg by mouth at bedtime.     diltiazem (CARDIZEM CD) 120 MG 24 hr capsule Take 120 mg by mouth daily.     fluticasone (FLONASE) 50 MCG/ACT nasal spray INSTILL 1 SPRAY INTO EACH NOSTRIL TWICE A DAY IF NEEDED (Patient taking differently: Place 1 spray into both nostrils daily as needed for allergies.) 48 g 3   furosemide (LASIX) 20 MG tablet Take 1 tablet by mouth once daily 90 tablet 1   meclizine (ANTIVERT) 12.5 MG tablet Take 1-2 tablets (12.5-25 mg total) by mouth 3 (three) times daily as needed for dizziness. 30 tablet 1   montelukast (SINGULAIR) 10 MG tablet TAKE 1 TABLET BY MOUTH AT BEDTIME 90 tablet 0   Mouthwashes (DRY MOUTH MOUTHWASH) LIQD Use as directed 5 mLs in the mouth or throat daily as needed (dry mouth). Biotene     Multiple Vitamins-Iron (MULTIVITAMIN/IRON PO) Take 1 tablet by mouth daily.     OPSUMIT 10 MG TABS Take 10 mg by mouth daily.     OXYGEN Inhale into the lungs. 2 LMP at night and upon exertion     pantoprazole (PROTONIX) 40 MG tablet Take 1 tablet (40 mg total) by mouth daily. 30 tablet 1   spironolactone (ALDACTONE) 25 MG tablet Take 12.5 mg by mouth daily.     sucralfate (CARAFATE) 1 GM/10ML suspension Take ml's by mouth at bedtime 900 mL 3  No current facility-administered medications for this visit.    Allergies  Allergen Reactions   Codeine Nausea Only    Hallucinations, too   Other Nausea And Vomiting    "-mycin" antibiotics.   Also cause hallucinations.   Erythromycin Nausea And Vomiting   Lisinopril     Pt doesn't remember    Mycophenolate Mofetil Nausea Only   Sulfa Antibiotics Other (See Comments)    unknown     Past Medical History:  Diagnosis Date   Anemia    Angiodysplasia of stomach and duodenum    Arthus phenomenon    AVM  (arteriovenous malformation)    Blood transfusion without reported diagnosis    last 4 units 12-22-15, Iron infusion x2 last -01-07-16,01-14-16.   Breast cancer ([place]) 1989   Left   Candida esophagitis (HCC)    Cataract    Chronic kidney disease    Chronic mild renal insuffiency   CREST syndrome (HCC)    GERD (gastroesophageal reflux disease)    w/ HH   Interstitial lung disease (HCC)    Nodule of kidney    PONV (postoperative nausea and vomiting)    Pulmonary embolus ([place]) 2003   Pulmonary hypertension ([place])    followed by Dr Gaynell Face at [facility], now Dr. Maryjean Ka visit end 2'17.   Rectal incontinence    Renal cell carcinoma (HCC)    Scleroderma (HCC)    Tubular adenoma of colon    Uterine polyp     Past Surgical History:  Procedure Laterality Date   APPENDECTOMY  1962   BREAST LUMPECTOMY  1989   left   CATARACT EXTRACTION, BILATERAL Bilateral 12/2013   COLONOSCOPY WITH PROPOFOL N/A 04/15/2021   Procedure: COLONOSCOPY WITH PROPOFOL;  Surgeon: Milus Banister, MD;  Location: [facility];  Service: Endoscopy;  Laterality: N/A;   ENTEROSCOPY N/A 01/18/2016   Procedure: ENTEROSCOPY;  Surgeon: Mauri Pole, MD;  Location: [facility];  Service: Endoscopy;  Laterality: N/A;   ENTEROSCOPY N/A 05/24/2018   Procedure: ENTEROSCOPY;  Surgeon: Jackquline Denmark, MD;  Location: [facility];  Service: Endoscopy;  Laterality: N/A;   ENTEROSCOPY N/A 03/29/2021   Procedure: ENTEROSCOPY;  Surgeon: Jerene Bears, MD;  Location: [facility];  Service: Gastroenterology;  Laterality: N/A;   ENTEROSCOPY N/A 04/13/2021   Procedure: ENTEROSCOPY;  Surgeon: Yetta Flock, MD;  Location: [facility];  Service: Gastroenterology;  Laterality: N/A;   ESOPHAGOGASTRODUODENOSCOPY (EGD) WITH PROPOFOL N/A 12/21/2015   Procedure: ESOPHAGOGASTRODUODENOSCOPY (EGD) WITH PROPOFOL;  Surgeon: Irene Shipper, MD;  Location: [facility];  Service: Endoscopy;  Laterality: N/A;   HOT HEMOSTASIS N/A 05/24/2018    Procedure: HOT HEMOSTASIS (ARGON PLASMA COAGULATION/BICAP);  Surgeon: Jackquline Denmark, MD;  Location[facility];  Service: Endoscopy;  Laterality: N/A;   HOT HEMOSTASIS N/A 03/29/2021   Procedure: HOT HEMOSTASIS (ARGON PLASMA COAGULATION/BICAP);  Surgeon: Jerene Bears, MD;  Location: [facility];  Service: Gastroenterology;  Laterality: N/A;   HOT HEMOSTASIS N/A 04/13/2021   Procedure: HOT HEMOSTASIS (ARGON PLASMA COAGULATION/BICAP);  Surgeon: Yetta Flock, MD;  Location: [facility];  Service: Gastroenterology;  Laterality: N/A;   IR GENERIC HISTORICAL  06/05/2016   IR RADIOLOGIST EVAL & MGMT 06/05/2016 Aletta Edouard, MD [facility] INTERV RAD   IVC Filter     KIDNEY SURGERY     left -"laser surgery by Dr. Kathlene Cote- 5 yrs ago" no removal   SCHLEROTHERAPY  05/24/2018   Procedure: SCHLEROTHERAPY;  Surgeon: Jackquline Denmark, MD;  Location: [facility];  Service: Endoscopy;;  SUBMUCOSAL TATTOO INJECTION  04/13/2021   Procedure: SUBMUCOSAL TATTOO INJECTION;  Surgeon: Yetta Flock, MD;  Location: [facility];  Service: Gastroenterology;;   TONSILLECTOMY     TUBAL LIGATION      Social History   Socioeconomic History   Marital status: Married    Spouse name: Not on file   Number of children: 2   Years of education: Not on file   Highest education level: Not on file  Occupational History   Occupation: Retired  Tobacco Use   Smoking status: Never   Smokeless tobacco: Never  Vaping Use   Vaping Use: Never used  Substance and Sexual Activity   Alcohol use: No   Drug use: No   Sexual activity: Not Currently  Other Topics Concern   Not on file  Social History Narrative   Married '61   1 son- '65, 1 daughter '63; 6 children (2 adopted)   SO- SOB   Retirement- doing well   Marriage in good health   Patient has never smoked   Alcohol use- no   Pt gets regular exercise         Social Determinants of Radio broadcast assistant Strain: Not on file  Food Insecurity: Not on  file  Transportation Needs: Not on file  Physical Activity: Not on file  Stress: Not on file  Social Connections: Not on file  Intimate Partner Violence: Not on file    Family History  Problem Relation Age of Onset   Bladder Cancer Father    Diabetes Father    Prostate cancer Father    Alzheimer's disease Mother    Diabetes Sister    Lung cancer Sister         smoker   Esophageal cancer Paternal Uncle    Colon cancer Neg Hx     ROS: no fevers or chills, productive cough, hemoptysis, dysphasia, odynophagia, melena, hematochezia, dysuria, hematuria, rash, seizure activity, orthopnea, PND, pedal edema, claudication. Remaining systems are negative.  Physical Exam:   There were no vitals taken for this visit.  General:  Well developed/well nourished in NAD Skin warm/dry Patient not depressed No peripheral clubbing Back-normal HEENT-normal/normal eyelids Neck supple/normal carotid upstroke bilaterally; no bruits; no JVD; no thyromegaly chest - CTA/ normal expansion CV - RRR/normal S1 and S2; no murmurs, rubs or gallops;  PMI nondisplaced Abdomen -NT/ND, no HSM, no mass, + bowel sounds, no bruit 2+ femoral pulses, no bruits Ext-no edema, chords, 2+ DP Neuro-grossly nonfocal  ECG - personally reviewed  A/P  1 pericardial effusion-patient has known large pericardial fusion which is unchanged and has been present for years.  We will plan to repeat echocardiogram to reassess and rule out tamponade physiology.  We will likely plan conservative measures.  2 moderate aortic stenosis-not clear that this is contributing to her dyspnea.  We will repeat echocardiogram to reassess severity.  3 pulmonary hypertension-this is felt secondary to interstitial lung disease and autoimmune disorder.  She is followed by pulmonary at [facility] and the also is scheduled to see Dr. Gilles Chiquito.  4 hypertension-patient's blood pressure is controlled.  We will continue present medications.  5 history of  AVMs-patient previously followed by gastroenterology.  Kirk Ruths, MD

## 2021-09-30 NOTE — Telephone Encounter (Signed)
Will check insurance benefits closer to scheduling and/or into the new year 2023.

## 2021-10-01 ENCOUNTER — Telehealth: Payer: Self-pay

## 2021-10-01 NOTE — Telephone Encounter (Signed)
Pt has stated she developed a cough late afternoon and the cough medication is helping some. However, she said it is a deep cough and wanted her PCP to be aware.  Pt states she would like Dr. Nathanial Millman nurse to please give her call to speak about the cough.

## 2021-10-01 NOTE — Telephone Encounter (Signed)
Okay, let us know if cough does not improve or breathing is impacted.

## 2021-10-03 ENCOUNTER — Ambulatory Visit
Admission: EM | Admit: 2021-10-03 | Discharge: 2021-10-03 | Disposition: A | Discharge status: Home / Self Care | Payer: Medicare PPO | Attending: Emergency Medicine | Admitting: Emergency Medicine

## 2021-10-03 ENCOUNTER — Other Ambulatory Visit: Payer: Self-pay

## 2021-10-03 DIAGNOSIS — J849 Interstitial pulmonary disease, unspecified: Secondary | ICD-10-CM | POA: Diagnosis not present

## 2021-10-03 DIAGNOSIS — J84112 Idiopathic pulmonary fibrosis: Secondary | ICD-10-CM

## 2021-10-03 DIAGNOSIS — J8410 Pulmonary fibrosis, unspecified: Secondary | ICD-10-CM

## 2021-10-03 MED ORDER — METHYLPREDNISOLONE 4 MG PO TABS: ORAL_TABLET | ORAL | 0 refills | Status: AC

## 2021-10-03 MED ORDER — METHYLPREDNISOLONE SODIUM SUCC 125 MG IJ SOLR
125.0000 mg | Freq: Once | INTRAMUSCULAR | Status: AC
  Administered 2021-10-03: 125 mg via INTRAMUSCULAR

## 2021-10-03 MED ORDER — AMOXICILLIN-POT CLAVULANATE 875-125 MG PO TABS
1.0000 | ORAL_TABLET | Freq: Two times a day (BID) | ORAL | 0 refills | Status: AC
Start: 2021-10-03 — End: 2021-10-10

## 2021-10-03 NOTE — ED Provider Notes (Signed)
[facility]    CSN: [id number] Arrival date & time: 10/03/21  1144    HISTORY  No chief complaint on file.  HPI Paula Ward is a 85 y.o. female. Patient presents to urgent care today with a chief complaint of cough that seems to be worsening over the last several days.  Patient states she is currently patient being followed by a pulmonologist at [facility].  Patient states she has been prescribed albuterol and Singulair, states she takes Singulair nightly and uses the albuterol on as-needed basis, states she is used it a few times since the cough began.  Patient states she is also tried over-the-counter Robitussin but does not feel like it helps much.  It alludes to previously been prescribed steroids for cough in the past.  Patient's EMR reviewed by me.  Patient has a history of scleroderma complicated by longstanding pulmonary hypertension, diffuse parenchymal lung disease most likely related to her scleroderma associated with interstitial lung disease.  CT scans of her lungs have demonstrated pulmonary fibrosis as far back as 2004.  Patient also apparently has a history of GERD, patient states she does take pantoprazole daily for this, states she is not sure whether it helps or not.  Patient has also been diagnosed with pulmonary hypertension related to her scleroderma.  Patient has a history of breast cancer, diagnosed and treated in 1989.   The history is provided by the patient.  Past Medical History:  Diagnosis Date   Anemia    Angiodysplasia of stomach and duodenum    Arthus phenomenon    AVM (arteriovenous malformation)    Blood transfusion without reported diagnosis    last 4 units 12-22-15, Iron infusion x2 last -01-07-16,01-14-16.   Breast cancer ([place]) 1989   Left   Candida esophagitis (HCC)    Cataract    Chronic kidney disease    Chronic mild renal insuffiency   CREST syndrome (HCC)    GERD (gastroesophageal reflux disease)    w/ HH   Interstitial lung disease  (HCC)    Nodule of kidney    PONV (postoperative nausea and vomiting)    Pulmonary embolus ([place]) 2003   Pulmonary hypertension ([place])    followed by Dr Gaynell Face at [facility] ([place]), now Dr. Maryjean Ka visit end 2'17.   Rectal incontinence    Renal cell carcinoma (HCC)    Scleroderma (HCC)    Tubular adenoma of colon    Uterine polyp    Patient Active Problem List   Diagnosis Date Noted   Generalized weakness 03/29/2021   Acute on chronic diastolic CHF (congestive heart failure) ([place]) 03/29/2021   Elevated troponin 03/29/2021   AVM (arteriovenous malformation) of small bowel, acquired    SOB (shortness of breath) on exertion 03/08/2021   Anemia, chronic disease 12/25/2020   Fatigue 12/25/2020   Hoarseness 07/24/2020   Drowsiness 12/17/2018   Leg swelling 09/03/2018   Orthopnea 06/24/2018   Dry mouth 02/15/2018   Allergic rhinitis 11/05/2017   Cerebellar 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 ([place]) 10/26/2017   Routine general medical examination at a health care facility 01/30/2017   Community acquired pneumonia 08/19/2016   Pericardial effusion 01/21/2016   Melena    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[place])    Osteoarthritis 02/25/2015   Mass of left side of neck    CKD (chronic kidney disease) stage 3, GFR 30-59 ml/min (HCC) 09/01/2012   RECTAL INCONTINENCE 03/27/2009   Angiodysplasia of stomach and duodenum 08/22/2008   ADENOCARCINOMA, BREAST, LEFT  07/26/2007   Anemia, iron deficiency 07/26/2007   Essential hypertension 07/26/2007   Pulmonary hypertension ([place]) 07/26/2007   GASTROESOPHAGEAL REFLUX DISEASE 07/26/2007   SCLERODERMA 07/26/2007   ILD (interstitial lung disease) ([place]) 07/26/2007   PULMONARY EMBOLISM, HX OF 07/26/2007   Past Surgical History:  Procedure Laterality Date   APPENDECTOMY  1962   BREAST LUMPECTOMY  1989   left   CATARACT EXTRACTION, BILATERAL Bilateral 12/2013   COLONOSCOPY WITH PROPOFOL N/A 04/15/2021   Procedure: COLONOSCOPY WITH PROPOFOL;  Surgeon:  Milus Banister, MD;  Location: [facility];  Service: Endoscopy;  Laterality: N/A;   ENTEROSCOPY N/A 01/18/2016   Procedure: ENTEROSCOPY;  Surgeon: Mauri Pole, MD;  Location: [facility];  Service: Endoscopy;  Laterality: N/A;   ENTEROSCOPY N/A 05/24/2018   Procedure: ENTEROSCOPY;  Surgeon: Jackquline Denmark, MD;  [facility];  Service: Endoscopy;  Laterality: N/A;   ENTEROSCOPY N/A 03/29/2021   Procedure: ENTEROSCOPY;  Surgeon: Jerene Bears, MD;  [facility];  Service: Gastroenterology;  Laterality: N/A;   ENTEROSCOPY N/A 04/13/2021   Procedure: ENTEROSCOPY;  Surgeon: Yetta Flock, MD;  [facility];  Service: Gastroenterology;  Laterality: N/A;   ESOPHAGOGASTRODUODENOSCOPY (EGD) WITH PROPOFOL N/A 12/21/2015   Procedure: ESOPHAGOGASTRODUODENOSCOPY (EGD) WITH PROPOFOL;  Surgeon: Irene Shipper, MD;  [facility];  Service: Endoscopy;  Laterality: N/A;   HOT HEMOSTASIS N/A 05/24/2018   Procedure: HOT HEMOSTASIS (ARGON PLASMA COAGULATION/BICAP);  Surgeon: Jackquline Denmark, MD;  [facility];  Service: Endoscopy;  Laterality: N/A;   HOT HEMOSTASIS N/A 03/29/2021   Procedure: HOT HEMOSTASIS (ARGON PLASMA COAGULATION/BICAP);  Surgeon: Jerene Bears, MD;  [facility];  Service: Gastroenterology;  Laterality: N/A;   HOT HEMOSTASIS N/A 04/13/2021   Procedure: HOT HEMOSTASIS (ARGON PLASMA COAGULATION/BICAP);  Surgeon: Yetta Flock, MD;  Location[facility];  Service: Gastroenterology;  Laterality: N/A;   IR GENERIC HISTORICAL  06/05/2016   IR RADIOLOGIST EVAL & MGMT 06/05/2016 Aletta Edouard, MD [facility] INTERV RAD   IVC Filter     KIDNEY SURGERY     left -"laser surgery by Dr. Kathlene Cote- 5 yrs ago" no removal   SCHLEROTHERAPY  05/24/2018   Procedure: SCHLEROTHERAPY;  Surgeon: Jackquline Denmark, MD;  [facility];  Service: Endoscopy;;   SUBMUCOSAL TATTOO INJECTION  04/13/2021   Procedure: SUBMUCOSAL TATTOO INJECTION;  Surgeon:  Yetta Flock, MD;  [facility];  Service: Gastroenterology;;   TONSILLECTOMY     TUBAL LIGATION     OB History   No obstetric history on file.    Home Medications    Prior to Admission medications   Medication Sig Start Date End Date Taking? Authorizing Provider  ACCU-CHEK AVIVA PLUS test strip  06/04/20   [provider]  acetaminophen (TYLENOL) 325 MG tablet Take 650 mg by mouth every 6 (six) hours as needed for mild pain.     [provider]  albuterol (VENTOLIN HFA) 108 (90 Base) MCG/ACT inhaler INHALE 2 PUFFS INTO THE LUNGS EVERY 6 HOURS AS NEEDED FOR WHEEZING OR SHORTNESS OF BREATH 03/06/20   Hoyt Koch, MD  ALPRAZolam Duanne Moron) 0.25 MG tablet Take 0.5 tablets (0.125 mg total) by mouth at bedtime as needed. Patient taking differently: Take 0.125 mg by mouth at bedtime as needed for anxiety or sleep. 10/29/20   Hoyt Koch, MD  atovaquone [facility]) 750 MG/5ML suspension Take 750 mg by mouth at bedtime. 07/10/20   [provider]  diltiazem (CARDIZEM CD) 120 MG  24 hr capsule Take 120 mg by mouth daily.    [provider]  fluticasone (FLONASE) 50 MCG/ACT nasal spray INSTILL 1 SPRAY INTO EACH NOSTRIL TWICE A DAY IF NEEDED Patient taking differently: Place 1 spray into both nostrils daily as needed for allergies. 09/18/20   Hoyt Koch, MD  furosemide (LASIX) 20 MG tablet Take 1 tablet by mouth once daily 05/28/21   Hoyt Koch, MD  meclizine (ANTIVERT) 12.5 MG tablet Take 1-2 tablets (12.5-25 mg total) by mouth 3 (three) times daily as needed for dizziness. 10/26/17   Binnie Rail, MD  montelukast (SINGULAIR) 10 MG tablet TAKE 1 TABLET BY MOUTH AT BEDTIME 09/26/21   Hoyt Koch, MD  Mouthwashes (DRY MOUTH MOUTHWASH) LIQD Use as directed 5 mLs in the mouth or throat daily as needed (dry mouth). Biotene 04/01/21   Shelly Coss, MD  Multiple Vitamins-Iron (MULTIVITAMIN/IRON PO) Take 1 tablet by  mouth daily.    [provider]  OPSUMIT 10 MG TABS Take 10 mg by mouth daily. 08/16/15   [provider]  OXYGEN Inhale into the lungs. 2 LMP at night and upon exertion    [provider]  pantoprazole (PROTONIX) 40 MG tablet Take 1 tablet (40 mg total) by mouth daily. 04/01/21   Shelly Coss, MD  spironolactone (ALDACTONE) 25 MG tablet Take 12.5 mg by mouth daily. 06/09/20   [provider]  sucralfate (CARAFATE) 1 GM/10ML suspension Take ml's by mouth at bedtime 06/06/21   Mauri Pole, MD   Family History Family History  Problem Relation Age of Onset   Bladder Cancer Father    Diabetes Father    Prostate cancer Father    Alzheimer's disease Mother    Diabetes Sister    Lung cancer Sister         smoker   Esophageal cancer Paternal Uncle    Colon cancer Neg Hx    Social History Social History   Tobacco Use   Smoking status: Never   Smokeless tobacco: Never  Vaping Use   Vaping Use: Never used  Substance Use Topics   Alcohol use: No   Drug use: No   Allergies   Codeine, Other, Erythromycin, Lisinopril, Mycophenolate mofetil, and Sulfa antibiotics  Review of Systems Review of Systems Pertinent findings noted in history of present illness.   Physical Exam Triage Vital Signs ED Triage Vitals  Enc Vitals Group     BP 08/16/21 0827 (!) 147/82     Pulse Rate 08/16/21 0827 72     Resp 08/16/21 0827 18     Temp 08/16/21 0827 98.3 F (36.8 C)     Temp Source 08/16/21 0827 Oral     SpO2 08/16/21 0827 98 %     Weight --      Height --      Head Circumference --      Peak Flow --      Pain Score 08/16/21 0826 5     Pain Loc --      Pain Edu? --      Excl. in [place]? --   No data found.  Updated Vital Signs BP 131/71 (BP Location: Left Arm)    Pulse 81    Temp 98.3 F (36.8 C) (Oral)    Resp 18    SpO2 92%   Physical Exam Vitals and nursing note reviewed.  Constitutional:      General: She is not in acute distress.  Appearance: Normal appearance. She is not ill-appearing.  HENT:     Head: Normocephalic and atraumatic.     Salivary Glands: Right salivary gland is not diffusely enlarged or tender. Left salivary gland is not diffusely enlarged or tender.     Right Ear: Ear canal and external ear normal. No drainage. A middle ear effusion is present. There is no impacted cerumen. Tympanic membrane is bulging. Tympanic membrane is not injected or erythematous.     Left Ear: Ear canal and external ear normal. No drainage. A middle ear effusion is present. There is no impacted cerumen. Tympanic membrane is bulging. Tympanic membrane is not injected or erythematous.     Ears:     Comments: Bilateral EACs normal, both TMs bulging with clear fluid    Nose: Rhinorrhea present. No nasal deformity, septal deviation, signs of injury, nasal tenderness, mucosal edema or congestion. Rhinorrhea is clear.     Right Nostril: Occlusion present. No foreign body, epistaxis or septal hematoma.     Left Nostril: Occlusion present. No foreign body, epistaxis or septal hematoma.     Right Turbinates: Enlarged, swollen and pale.     Left Turbinates: Enlarged, swollen and pale.     Right Sinus: No maxillary sinus tenderness or frontal sinus tenderness.     Left Sinus: No maxillary sinus tenderness or frontal sinus tenderness.     Mouth/Throat:     Lips: Pink. No lesions.     Mouth: Mucous membranes are moist. No oral lesions.     Pharynx: Oropharynx is clear. Uvula midline. No posterior oropharyngeal erythema or uvula swelling.     Tonsils: No tonsillar exudate. 0 on the right. 0 on the left.     Comments: Postnasal drip Eyes:     General: Lids are normal.        Right eye: No discharge.        Left eye: No discharge.     Extraocular Movements: Extraocular movements intact.     Conjunctiva/sclera: Conjunctivae normal.     Right eye: Right conjunctiva is not injected.     Left eye: Left conjunctiva is not injected.  Neck:      Trachea: Trachea and phonation normal.  Cardiovascular:     Rate and Rhythm: Normal rate and regular rhythm.     Pulses: Normal pulses.     Heart sounds: Normal heart sounds. No murmur heard.   No friction rub. No gallop.  Pulmonary:     Effort: Pulmonary effort is normal. No accessory muscle usage, prolonged expiration or respiratory distress.     Breath sounds: Decreased air movement present. No stridor or transmitted upper airway sounds. Examination of the right-upper field reveals decreased breath sounds. Examination of the left-upper field reveals decreased breath sounds. Examination of the right-middle field reveals decreased breath sounds. Examination of the left-middle field reveals decreased breath sounds. Examination of the right-lower field reveals decreased breath sounds. Examination of the left-lower field reveals decreased breath sounds. Decreased breath sounds present. No wheezing, rhonchi or rales.     Comments: Mild crackles appreciated at both lung bases posteriorly. Chest:     Chest wall: No tenderness.  Musculoskeletal:        General: Normal range of motion.     Cervical back: Normal range of motion and neck supple. Normal range of motion.  Lymphadenopathy:     Cervical: No cervical adenopathy.  Skin:    General: Skin is warm and dry.     Findings: No erythema  or rash.  Neurological:     General: No focal deficit present.     Mental Status: She is alert and oriented to person, place, and time.  Psychiatric:        Mood and Affect: Mood normal.        Behavior: Behavior normal.    Visual Acuity Right Eye Distance:   Left Eye Distance:   Bilateral Distance:    Right Eye Near:   Left Eye Near:    Bilateral Near:     UC Couse / Diagnostics / Procedures:    EKG  Radiology No results found.  Procedures Procedures (including critical care time)  UC Diagnoses / Final Clinical Impressions(s)   I have reviewed the triage vital signs and the nursing  notes.  Pertinent labs & imaging results that were available during my care of the patient were reviewed by me and considered in my medical decision making (see chart for details).   Final diagnoses:  Pulmonary fibrosis ([place])  Interstitial lung disease ([place])  Acute exacerbation of idiopathic pulmonary fibrosis ([place])   After consulting with guidelines for management of acute exacerbation of idiopathic pulmonary fibrosis, ILD, I believe it would be best to provide patient with a 7-day course of Augmentin for presumed pneumonitis that could become bacterial if it is not already.  I also advised patient that I recommend she received an injection of methylprednisolone in the office today and continue a long taper of oral methylprednisolone.  Patient was agreeable to this plan.  Patient also agreed to reach out to her pulmonology provider at [facility] to let them know of her visit today, provide them with the details of her cough and let them know the regimen that I have provided for her today in the interest of allowing them to tailor the regimen if and as needed.  ED Prescriptions     Medication Sig Dispense Auth. Provider   methylPREDNISolone (MEDROL) 4 MG tablet Take 8 tablets (32 mg total) by mouth daily for 1 day, THEN 7 tablets (28 mg total) daily for 1 day, THEN 6 tablets (24 mg total) daily for 1 day, THEN 5 tablets (20 mg total) daily for 1 day, THEN 4 tablets (16 mg total) daily for 1 day, THEN 3 tablets (12 mg total) daily for 1 day, THEN 2 tablets (8 mg total) daily for 1 day, THEN 1 tablet (4 mg total) daily for 1 day. 36 tablet Lynden Oxford Scales, PA-C   amoxicillin-clavulanate (AUGMENTIN) 875-125 MG tablet Take 1 tablet by mouth every 12 (twelve) hours for 7 days. 14 tablet Lynden Oxford Scales, PA-C      PDMP not reviewed this encounter.  Pending results:  Labs Reviewed - No data to display  Medications Ordered in UC: Medications  methylPREDNISolone sodium succinate  (SOLU-MEDROL) 125 mg/2 mL injection 125 mg (125 mg Intramuscular Given 10/03/21 1247)    Disposition Upon Discharge:  Condition: stable for discharge home Home: take medications as prescribed; routine discharge instructions as discussed; follow up as advised.  Patient presented with an acute illness with associated systemic symptoms and significant discomfort requiring urgent management. In my opinion, this is a condition that a prudent lay person (someone who possesses an average knowledge of health and medicine) may potentially expect to result in complications if not addressed urgently such as respiratory distress, impairment of bodily function or dysfunction of bodily organs.   Routine symptom specific, illness specific and/or disease specific instructions were discussed with the patient and/or  caregiver at length.   As such, the patient has been evaluated and assessed, work-up was performed and treatment was provided in alignment with urgent care protocols and evidence based medicine.  Patient/parent/caregiver has been advised that the patient may require follow up for further testing and treatment if the symptoms continue in spite of treatment, as clinically indicated and appropriate.  The patient was tested for COVID-19, Influenza and/or RSV, then the patient/parent/guardian was advised to isolate at home pending the results of his/her diagnostic coronavirus test and potentially longer if theyre positive. I have also advised pt that if his/her COVID-19 test returns positive, it's recommended to self-isolate for at least 10 days after symptoms first appeared AND until fever-free for 24 hours without fever reducer AND other symptoms have improved or resolved. Discussed self-isolation recommendations as well as instructions for household member/close contacts as per the [facility] and [place] DHHS, and also gave patient the [place] packet with this information.  Patient/parent/caregiver has been advised to  return to the [facility] or PCP in 3-5 days if no better; to PCP or the Emergency Department if new signs and symptoms develop, or if the current signs or symptoms continue to change or worsen for further workup, evaluation and treatment as clinically indicated and appropriate  The patient will follow up with their current PCP if and as advised. If the patient does not currently have a PCP we will assist them in obtaining one.   The patient may need specialty follow up if the symptoms continue, in spite of conservative treatment and management, for further workup, evaluation, consultation and treatment as clinically indicated and appropriate.  Patient/parent/caregiver verbalized understanding and agreement of plan as discussed.  All questions were addressed during visit.  Please see discharge instructions below for further details of plan.  Discharge Instructions:   Discharge Instructions      To calm this flareup of your known interstitial lung disease, you are provided with an injection of methylprednisolone in the office.  Tomorrow morning, please begin taking the tapering dose of methylprednisolone by mouth, please take it with your breakfast meal, exactly as prescribed.  To protect you from developing pneumonia, please begin Augmentin 1 tablet twice daily for the next 7 days.  Please reach out to [facility] to let them know that you were seen today, what I recommended for treatment and allow them to make any suggestions or adjustments to my treatment recommendation as they feel is appropriate.  Your lung doctors no past, I am simply hoping to fill the role of interim care until you hear back from them.      This office note has been dictated using Museum/gallery curator.  Unfortunately, and despite my best efforts, this method of dictation can sometimes lead to occasional typographical or grammatical errors.  I apologize in advance if this occurs.     Lynden Oxford Scales,  PA-C 10/03/21 1404

## 2021-10-03 NOTE — Discharge Instructions (Addendum)
To calm this flareup of your known interstitial lung disease, you are provided with an injection of methylprednisolone in the office.  Tomorrow morning, please begin taking the tapering dose of methylprednisolone by mouth, please take it with your breakfast meal, exactly as prescribed.  To protect you from developing pneumonia, please begin Augmentin 1 tablet twice daily for the next 7 days.  Please reach out to [facility] to let them know that you were seen today, what I recommended for treatment and allow them to make any suggestions or adjustments to my treatment recommendation as they feel is appropriate.  Your lung doctors no past, I am simply hoping to fill the role of interim care until you hear back from them.

## 2021-10-03 NOTE — ED Triage Notes (Signed)
Pt c/o cough that began Monday. She has been taking Robitussin DM that has not been helpful.

## 2021-10-04 ENCOUNTER — Ambulatory Visit: Payer: Medicare PPO | Admitting: Cardiology

## 2021-10-10 ENCOUNTER — Telehealth (HOSPITAL_COMMUNITY): Payer: Self-pay | Admitting: Internal Medicine

## 2021-10-16 ENCOUNTER — Other Ambulatory Visit (INDEPENDENT_AMBULATORY_CARE_PROVIDER_SITE_OTHER): Payer: Medicare PPO

## 2021-10-16 DIAGNOSIS — N1831 Chronic kidney disease, stage 3a: Secondary | ICD-10-CM | POA: Diagnosis not present

## 2021-10-16 LAB — CBC WITH DIFFERENTIAL/PLATELET
Basophils Absolute: 0 10*3/uL (ref 0.0–0.1)
Basophils Relative: 0.5 % (ref 0.0–3.0)
Eosinophils Absolute: 0.1 10*3/uL (ref 0.0–0.7)
Eosinophils Relative: 0.8 % (ref 0.0–5.0)
HCT: 28 % — ABNORMAL LOW (ref 36.0–46.0)
Hemoglobin: 9.3 g/dL — ABNORMAL LOW (ref 12.0–15.0)
Lymphocytes Relative: 15.5 % (ref 12.0–46.0)
Lymphs Abs: 1.3 10*3/uL (ref 0.7–4.0)
MCHC: 33.1 g/dL (ref 30.0–36.0)
MCV: 101.4 fl — ABNORMAL HIGH (ref 78.0–100.0)
Monocytes Absolute: 0.5 10*3/uL (ref 0.1–1.0)
Monocytes Relative: 5.6 % (ref 3.0–12.0)
Neutro Abs: 6.4 10*3/uL (ref 1.4–7.7)
Neutrophils Relative %: 77.6 % — ABNORMAL HIGH (ref 43.0–77.0)
Platelets: 201 10*3/uL (ref 150.0–400.0)
RBC: 2.76 Mil/uL — ABNORMAL LOW (ref 3.87–5.11)
RDW: 14.9 % (ref 11.5–15.5)
WBC: 8.3 10*3/uL (ref 4.0–10.5)

## 2021-10-16 LAB — COMPREHENSIVE METABOLIC PANEL
ALT: 10 U/L (ref 0–35)
AST: 19 U/L (ref 0–37)
Albumin: 3.9 g/dL (ref 3.5–5.2)
Alkaline Phosphatase: 47 U/L (ref 39–117)
BUN: 33 mg/dL — ABNORMAL HIGH (ref 6–23)
CO2: 30 mEq/L (ref 19–32)
Calcium: 9.1 mg/dL (ref 8.4–10.5)
Chloride: 102 mEq/L (ref 96–112)
Creatinine, Ser: 1.2 mg/dL (ref 0.40–1.20)
GFR: 41.96 mL/min — ABNORMAL LOW (ref >60.00)
Glucose, Bld: 98 mg/dL (ref 70–99)
Potassium: 4.3 mEq/L (ref 3.5–5.1)
Sodium: 139 mEq/L (ref 135–145)
Total Bilirubin: 0.5 mg/dL (ref 0.2–1.2)
Total Protein: 6.3 g/dL (ref 6.0–8.3)

## 2021-10-17 ENCOUNTER — Telehealth (HOSPITAL_COMMUNITY): Payer: Self-pay | Admitting: Internal Medicine

## 2021-10-18 ENCOUNTER — Encounter: Payer: Self-pay | Admitting: Internal Medicine

## 2021-10-24 ENCOUNTER — Other Ambulatory Visit: Payer: Self-pay

## 2021-10-24 ENCOUNTER — Ambulatory Visit: Payer: Medicare PPO | Admitting: Gastroenterology

## 2021-10-24 ENCOUNTER — Encounter: Payer: Self-pay | Admitting: Gastroenterology

## 2021-10-24 ENCOUNTER — Other Ambulatory Visit: Payer: Self-pay | Admitting: Pharmacy Technician

## 2021-10-24 VITALS — BP 128/50 | HR 80 | Ht 62.5 in | Wt 108.2 lb

## 2021-10-24 DIAGNOSIS — K5904 Chronic idiopathic constipation: Secondary | ICD-10-CM

## 2021-10-24 DIAGNOSIS — N183 Chronic kidney disease, stage 3 unspecified: Secondary | ICD-10-CM | POA: Diagnosis not present

## 2021-10-24 DIAGNOSIS — I3139 Other pericardial effusion (noninflammatory): Secondary | ICD-10-CM | POA: Diagnosis not present

## 2021-10-24 DIAGNOSIS — Z8774 Personal history of (corrected) congenital malformations of heart and circulatory system: Secondary | ICD-10-CM | POA: Diagnosis not present

## 2021-10-24 DIAGNOSIS — J84112 Idiopathic pulmonary fibrosis: Secondary | ICD-10-CM | POA: Diagnosis not present

## 2021-10-24 DIAGNOSIS — M349 Systemic sclerosis, unspecified: Secondary | ICD-10-CM

## 2021-10-24 DIAGNOSIS — D500 Iron deficiency anemia secondary to blood loss (chronic): Secondary | ICD-10-CM | POA: Diagnosis not present

## 2021-10-24 NOTE — Progress Notes (Signed)
Paula Ward    [id number]    1936-04-24  Primary Care Physician:Crawford, Real Cons, MD  Referring Physician: Hoyt Koch, MD [address] [place],  [place] [address]    Chief Complaint  Patient presents with   iron Def Anemia   Fatigue     HPI:  86 year old very pleasant female with history of scleroderma, pulmonary hypertension, large pericardial effusion, small bowel AVM with chronic anemia here for follow-up visit  She was in urgent care last month with acute exacerbation of IPF, bronchitis.  It improved after she was on methylprednisone taper dose.  She is currently taking 4 mg 1 tablet daily prescribed by [place] pulmonologist.  She feels her energy level is better on steroids.  Denies any melena or rectal bleeding.  She continues to experience significant fatigue.  Weight has stabilized.  She is no longer losing weight.  Her appetite is normal.  She is using MiraLAX every other day and has regular bowel movements.  She has intermittent episodes of fecal incontinence when she walks or she finds stool seepage in her depends   She was hospitalized in June 2022 with severe anemia and melena   She has been off anticoagulation due to history of recurrent GI bleed  Small bowel enteroscopy on June 10 and April 13, 2021: Multiple AVMs in the stomach and small bowel treated with APC   Colonoscopy April 15, 2021: Left colon diverticulosis otherwise unremarkable exam   She continues to have shortness of breath and cough. She is followed by [facility] for pulmonary hypertension and ILD.  Her oxygen saturation was 93% on 6 minute walk test, has home O2. She was previously on Bactrim, CellCept and prednisone.  She is currently on macitentan and diltiazem. She has large pericardial effusion without tamponade physiology, is monitored by cardiology at [facility]    CT abdomen and pelvis 04/04/2019: Large amount of stool through out the colon, colon  diverticulosis otherwise no acute pathology Large pericardial effusion, limited imaging.   CT chest UIP pattern fibrosis   Colonoscopy July 12, 2014 by Dr. Olevia Perches: Sessile polyp tubular adenoma removed from ascending colon.  Diverticulosis       Outpatient Encounter Medications as of 10/24/2021  Medication Sig   ACCU-CHEK AVIVA PLUS test strip    acetaminophen (TYLENOL) 325 MG tablet Take 650 mg by mouth every 6 (six) hours as needed for mild pain.    albuterol (VENTOLIN HFA) 108 (90 Base) MCG/ACT inhaler INHALE 2 PUFFS INTO THE LUNGS EVERY 6 HOURS AS NEEDED FOR WHEEZING OR SHORTNESS OF BREATH   ALPRAZolam (XANAX) 0.25 MG tablet Take 0.5 tablets (0.125 mg total) by mouth at bedtime as needed. (Patient taking differently: Take 0.125 mg by mouth at bedtime as needed for anxiety or sleep.)   atovaquone (MEPRON) 750 MG/5ML suspension Take 750 mg by mouth at bedtime.   diltiazem (CARDIZEM CD) 120 MG 24 hr capsule Take 120 mg by mouth daily.   fluticasone (FLONASE) 50 MCG/ACT nasal spray INSTILL 1 SPRAY INTO EACH NOSTRIL TWICE A DAY IF NEEDED (Patient taking differently: Place 1 spray into both nostrils daily as needed for allergies.)   furosemide (LASIX) 20 MG tablet Take 1 tablet by mouth once daily   meclizine (ANTIVERT) 12.5 MG tablet Take 1-2 tablets (12.5-25 mg total) by mouth 3 (three) times daily as needed for dizziness.   methylPREDNISolone (MEDROL) 4 MG tablet Take 1 tablet by mouth daily. For  60 days   montelukast (SINGULAIR) 10 MG tablet TAKE 1 TABLET BY MOUTH AT BEDTIME   Mouthwashes (DRY MOUTH MOUTHWASH) LIQD Use as directed 5 mLs in the mouth or throat daily as needed (dry mouth). Biotene   Multiple Vitamins-Iron (MULTIVITAMIN/IRON PO) Take 1 tablet by mouth daily.   OPSUMIT 10 MG TABS Take 10 mg by mouth daily.   OXYGEN Inhale into the lungs. 2 LMP at night and upon exertion   pantoprazole (PROTONIX) 40 MG tablet Take 1 tablet (40 mg total) by mouth daily.   spironolactone  (ALDACTONE) 25 MG tablet Take 12.5 mg by mouth daily.   sucralfate (CARAFATE) 1 GM/10ML suspension Take ml's by mouth at bedtime   No facility-administered encounter medications on file as of 10/24/2021.    Allergies as of 10/24/2021 - Review Complete 10/24/2021  Allergen Reaction Noted   Codeine Nausea Only 08/03/2008   Other Nausea And Vomiting 04/05/2012   Erythromycin Nausea And Vomiting 10/17/2014   Lisinopril  12/14/2014   Mycophenolate mofetil Nausea Only 09/23/2019   Sulfa antibiotics Other (See Comments) 03/28/2021    Past Medical History:  Diagnosis Date   Anemia    Angiodysplasia of stomach and duodenum    Arthus phenomenon    AVM (arteriovenous malformation)    Blood transfusion without reported diagnosis    last 4 units 12-22-15, Iron infusion x2 last -01-07-16,01-14-16.   Breast cancer ([place]) 1989   Left   Candida esophagitis (HCC)    Cataract    Chronic kidney disease    Chronic mild renal insuffiency   CREST syndrome (HCC)    GERD (gastroesophageal reflux disease)    w/ HH   Interstitial lung disease (HCC)    Nodule of kidney    PONV (postoperative nausea and vomiting)    Pulmonary embolus ([place]) 2003   Pulmonary hypertension ([place])    followed by Dr Gaynell Face at [facility], now Dr. Maryjean Ka visit end 2'17.   Rectal incontinence    Renal cell carcinoma (HCC)    Scleroderma (HCC)    Tubular adenoma of colon    Uterine polyp     Past Surgical History:  Procedure Laterality Date   APPENDECTOMY  1962   BREAST LUMPECTOMY  1989   left   CATARACT EXTRACTION, BILATERAL Bilateral 12/2013   COLONOSCOPY WITH PROPOFOL N/A 04/15/2021   Procedure: COLONOSCOPY WITH PROPOFOL;  Surgeon: Milus Banister, MD;  Location: [facility];  Service: Endoscopy;  Laterality: N/A;   ENTEROSCOPY N/A 01/18/2016   Procedure: ENTEROSCOPY;  Surgeon: Mauri Pole, MD;  Location: [facility];  Service: Endoscopy;  Laterality: N/A;   ENTEROSCOPY N/A 05/24/2018   Procedure: ENTEROSCOPY;   Surgeon: Jackquline Denmark, MD;  Location: [facility];  Service: Endoscopy;  Laterality: N/A;   ENTEROSCOPY N/A 03/29/2021   Procedure: ENTEROSCOPY;  Surgeon: Jerene Bears, MD;  Location: [facility];  Service: Gastroenterology;  Laterality: N/A;   ENTEROSCOPY N/A 04/13/2021   Procedure: ENTEROSCOPY;  Surgeon: Yetta Flock, MD;  Location: [facility];  Service: Gastroenterology;  Laterality: N/A;   ESOPHAGOGASTRODUODENOSCOPY (EGD) WITH PROPOFOL N/A 12/21/2015   Procedure: ESOPHAGOGASTRODUODENOSCOPY (EGD) WITH PROPOFOL;  Surgeon: Irene Shipper, MD;  Location: [facility];  Service: Endoscopy;  Laterality: N/A;   HOT HEMOSTASIS N/A 05/24/2018   Procedure: HOT HEMOSTASIS (ARGON PLASMA COAGULATION/BICAP);  Surgeon: Jackquline Denmark, MD;  [facility];  Service: Endoscopy;  Laterality: N/A;   HOT HEMOSTASIS N/A 03/29/2021   Procedure: HOT HEMOSTASIS (ARGON PLASMA COAGULATION/BICAP);  Surgeon: Zenovia Jarred  Jerilynn Mages, MD;  Location: [facility];  Service: Gastroenterology;  Laterality: N/A;   HOT HEMOSTASIS N/A 04/13/2021   Procedure: HOT HEMOSTASIS (ARGON PLASMA COAGULATION/BICAP);  Surgeon: Yetta Flock, MD;  Location: [facility];  Service: Gastroenterology;  Laterality: N/A;   IR GENERIC HISTORICAL  06/05/2016   IR RADIOLOGIST EVAL & MGMT 06/05/2016 Aletta Edouard, MD [facility] INTERV RAD   IVC Filter     KIDNEY SURGERY     left -"laser surgery by Dr. Kathlene Cote- 5 yrs ago" no removal   SCHLEROTHERAPY  05/24/2018   Procedure: SCHLEROTHERAPY;  Surgeon: Jackquline Denmark, MD;  [facility]: [facility];  Service: Endoscopy;;   SUBMUCOSAL TATTOO INJECTION  04/13/2021   Procedure: SUBMUCOSAL TATTOO INJECTION;  Surgeon: Yetta Flock, MD;  Location: [facility];  Service: Gastroenterology;;   TONSILLECTOMY     TUBAL LIGATION      Family History  Problem Relation Age of Onset   Bladder Cancer Father    Diabetes Father    Prostate cancer Father    Alzheimer's disease Mother    Diabetes Sister     Lung cancer Sister         smoker   Esophageal cancer Paternal Uncle    Colon cancer Neg Hx     Social History   Socioeconomic History   Marital status: Married    Spouse name: Not on file   Number of children: 2   Years of education: Not on file   Highest education level: Not on file  Occupational History   Occupation: Retired  Tobacco Use   Smoking status: Never   Smokeless tobacco: Never  Vaping Use   Vaping Use: Never used  Substance and Sexual Activity   Alcohol use: No   Drug use: No   Sexual activity: Not Currently  Other Topics Concern   Not on file  Social History Narrative   Married '61   1 son- '65, 1 daughter '63; 6 children (2 adopted)   SO- SOB   Retirement- doing well   Marriage in good health   Patient has never smoked   Alcohol use- no   Pt gets regular exercise         Social Determinants of Radio broadcast assistant Strain: Not on file  Food Insecurity: Not on file  Transportation Needs: Not on file  Physical Activity: Not on file  Stress: Not on file  Social Connections: Not on file  Intimate Partner Violence: Not on file      Review of systems: All other review of systems negative except as mentioned in the HPI.   Physical Exam: Vitals:   10/24/21 1101  BP: (!) 128/50  Pulse: 80   Body mass index is 19.48 kg/m. Gen:      No acute distress HEENT:  sclera anicteric Abd:      soft, non-tender; no palpable masses, no distension Ext:    No edema Neuro: alert and oriented x 3 Psych: normal mood and affect  Data Reviewed:  Reviewed labs, radiology imaging, old records and pertinent past GI work up   Assessment and Plan/Recommendations:  86 year old female with history of scleroderma /systemic sclerosis, interstitial lung disease/chronic parenchymal disease/pulmonary fibrosis, large pericardial effusion and pulmonary hypertension with recurrent GI bleed secondary to gastrointestinal AVMs   She has chronic iron  deficiency anemia due to chronic intermittent occult GI blood loss Continue to monitor monthly CBC.  Will add iron panel standing order for every 3 months and IV iron infusion Feraheme  X2 every 3 months to maintain iron stores and to prevent anemia   She has had multiple hospitalization due to recurrent GI bleed in the setting of AVMs. Insurance declined long acting octreotide to decrease transfusion requirement and hospitalizations    Chronic kidney disease stage III, is followed by nephrology  Chronic idiopathic constipation and fecal incontinence due to weak anal sphincter.  Continue to use MiraLAX every other day, titrate dose based on response to have regular bowel movements.   Pulmonary fibrosis and pulmonary hypertension is followed by [facility], she is on continuous O2 and methylprednisolone daily.  Advised patient to discuss chronic steroid therapy and taper with her pulmonologist  She was referred to pulmonary rehab  Large pericardial effusion without cardiac tamponade: Followed by [facility] cardiology, patient wants to establish locally.  We will send referral to  Dr.Cooper   This visit required >40 minutes of patient care (this includes precharting, chart review, review of results, face-to-face time used for counseling as well as treatment plan and follow-up. The patient was provided an opportunity to ask questions and all were answered. The patient agreed with the plan and demonstrated an understanding of the instructions.  Damaris Hippo , MD    CC: Hoyt Koch, *

## 2021-10-24 NOTE — Patient Instructions (Signed)
We will contact you about your IV Iron infusions  You will need labs (IBC Panel/Ferritin) in 3 months  Follow up in 3 months  If you are age 84 or older, your body mass index should be between 23-30. Your Body mass index is 19.48 kg/m. If this is out of the aforementioned range listed, please consider follow up with your Primary Care Provider.  If you are age 65 or younger, your body mass index should be between 19-25. Your Body mass index is 19.48 kg/m. If this is out of the aformentioned range listed, please consider follow up with your Primary Care Provider.   ________________________________________________________  The [facility] providers would like to encourage you to use [facility] to communicate with providers for non-urgent requests or questions.  Due to long hold times on the telephone, sending your provider a message by [facility] may be a faster and more efficient way to get a response.  Please allow 48 business hours for a response.  Please remember that this is for non-urgent requests.  _______________________________________________________   I appreciate the  opportunity to care for you  Thank You   Harl Bowie , MD

## 2021-10-25 ENCOUNTER — Telehealth: Payer: Self-pay | Admitting: Gastroenterology

## 2021-10-25 ENCOUNTER — Telehealth: Payer: Self-pay | Admitting: Pharmacy Technician

## 2021-10-25 NOTE — Telephone Encounter (Signed)
Patient calling to make sure she did not need to get labs yesterday. Told her it is for 3 months. Orders are in for the patient

## 2021-10-25 NOTE — Telephone Encounter (Signed)
Patient called back and needs this letter to be faxed to [facility] - Fax number is [phone]    - attn:  Colletta Maryland or Estill Bamberg - patient was unsure which

## 2021-10-25 NOTE — Telephone Encounter (Signed)
Patient called requesting to speak with you regarding the after visit summary she was provided with.

## 2021-10-25 NOTE — Telephone Encounter (Signed)
Auth Submission: Pending Payer: [employer] Medication & CPT/J Code(s) submitted: Feraheme (ferumoxytol) [id number] Route of submission (phone, fax, portal):  [phone] FAX: [phone] Auth type: Buy/Bill Units/visits requested: 8 Reference number: [id number]  Will update once we receive a response.

## 2021-10-28 NOTE — Telephone Encounter (Addendum)
Dr. Silverio Decamp,  Juluis Rainier note: Auth Submission: APPROVED Payer: [employer] Medication & CPT/J Code(s) submitted: Feraheme (ferumoxytol) [phone] Route of submission (phone, fax, portal): PHONE: [phone] FAX: [phone] Auth type: Buy/Bill Units/visits requested: 8 visits Reference number: [id number] Approval from: 10/25/21 to 10/19/22  Patient will be scheduled as soon as possible.

## 2021-10-29 DIAGNOSIS — Z86711 Personal history of pulmonary embolism: Secondary | ICD-10-CM | POA: Diagnosis not present

## 2021-10-29 DIAGNOSIS — I350 Nonrheumatic aortic (valve) stenosis: Secondary | ICD-10-CM | POA: Diagnosis not present

## 2021-10-29 DIAGNOSIS — I270 Primary pulmonary hypertension: Secondary | ICD-10-CM | POA: Diagnosis not present

## 2021-10-29 DIAGNOSIS — R0602 Shortness of breath: Secondary | ICD-10-CM | POA: Diagnosis not present

## 2021-10-29 DIAGNOSIS — Z79899 Other long term (current) drug therapy: Secondary | ICD-10-CM | POA: Diagnosis not present

## 2021-10-29 DIAGNOSIS — I272 Pulmonary hypertension, unspecified: Secondary | ICD-10-CM | POA: Diagnosis not present

## 2021-10-29 DIAGNOSIS — I050 Rheumatic mitral stenosis: Secondary | ICD-10-CM | POA: Diagnosis not present

## 2021-11-04 ENCOUNTER — Ambulatory Visit (INDEPENDENT_AMBULATORY_CARE_PROVIDER_SITE_OTHER): Payer: Medicare PPO

## 2021-11-04 ENCOUNTER — Other Ambulatory Visit: Payer: Self-pay

## 2021-11-04 VITALS — BP 107/57 | HR 76 | Temp 97.9°F | Resp 16 | Ht 63.0 in | Wt 104.6 lb

## 2021-11-04 DIAGNOSIS — N183 Chronic kidney disease, stage 3 unspecified: Secondary | ICD-10-CM

## 2021-11-04 DIAGNOSIS — D500 Iron deficiency anemia secondary to blood loss (chronic): Secondary | ICD-10-CM | POA: Diagnosis not present

## 2021-11-04 MED ORDER — SODIUM CHLORIDE 0.9 % IV SOLN
510.0000 mg | Freq: Once | INTRAVENOUS | Status: AC
  Administered 2021-11-04: 510 mg via INTRAVENOUS
  Filled 2021-11-04: qty 17

## 2021-11-04 MED ORDER — METHYLPREDNISOLONE SODIUM SUCC 125 MG IJ SOLR: 125.0000 mg | Freq: Once | INTRAMUSCULAR | Status: DC | PRN

## 2021-11-04 MED ORDER — FAMOTIDINE IN NACL 20-0.9 MG/50ML-% IV SOLN: 20.0000 mg | Freq: Once | INTRAVENOUS | Status: DC | PRN

## 2021-11-04 MED ORDER — EPINEPHRINE 0.3 MG/0.3ML IJ SOAJ: 0.3000 mg | Freq: Once | INTRAMUSCULAR | Status: DC | PRN

## 2021-11-04 MED ORDER — ALBUTEROL SULFATE HFA 108 (90 BASE) MCG/ACT IN AERS: 2.0000 | INHALATION_SPRAY | Freq: Once | RESPIRATORY_TRACT | Status: DC | PRN

## 2021-11-04 MED ORDER — SODIUM CHLORIDE 0.9 % IV SOLN: Freq: Once | INTRAVENOUS | Status: DC | PRN

## 2021-11-04 MED ORDER — DIPHENHYDRAMINE HCL 50 MG/ML IJ SOLN: 50.0000 mg | Freq: Once | INTRAMUSCULAR | Status: DC | PRN

## 2021-11-04 NOTE — Progress Notes (Signed)
Diagnosis: Iron Deficiency Anemia  Provider:  Marshell Garfinkel, MD  Procedure: Infusion  IV Type: Peripheral, IV Location: R Antecubital  Feraheme (Ferumoxytol), Dose: 510 mg  Infusion Start Time: 7218  Infusion Stop Time: 2883  Post Infusion IV Care: Observation period completed and Peripheral IV Discontinued  Discharge: Condition: Good, Destination: Home . AVS provided to patient.   Performed by:  Charlie Pitter, RN

## 2021-11-06 NOTE — Progress Notes (Signed)
Paula Pilot, MD Reason for referral-pericardial effusion and pulmonary hypertension  HPI: 86 year old female for evaluation of pericardial effusion and pulmonary hypertension at request of Harl Bowie, MD. Patient seen previously but not since 2017.  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 Has had IVC filter placed.  Patient has had a large pericardial effusion dating back to at least 2003.  Cardiac catheterization in 2003 showed normal coronary arteries, ejection fraction 55%, PA pressure of 79/35, wedge pressure 16, PVR 722. Last cardiac catheterization in September 2011 showed a PA pressure of 35/11 with PVR equal to 1.7 Wood units. 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 She becomes more dyspneic with anemia. Chest CT December 2022 showed UIP, unchanged large pericardial effusion.  Last echo January 2023 at [facility] showed normal LV systolic function, mild left ventricular hypertrophy, moderate aortic stenosis with mean gradient 28 mmHg, moderate mitral stenosis with mean gradient 6 mmHg and large pericardial effusion with no evidence of tamponade.  Patient has been followed at [facility] for her pulmonary hypertension.  She has previously not wanted to try Flolan.  She is presently on macitentan and diltiazem.  She apparently did not tolerate CellCept or azathioprine for her pulmonary fibrosis and has been maintained on prednisone.  Patient has chronic dyspnea on exertion but she denies orthopnea, PND, pedal edema, chest pain or syncope.  Current Outpatient Medications  Medication Sig Dispense Refill   ACCU-CHEK AVIVA PLUS test strip      acetaminophen (TYLENOL) 325 MG tablet Take 650 mg by mouth every 6 (six) hours as needed for mild pain.      albuterol (VENTOLIN HFA) 108 (90 Base) MCG/ACT inhaler INHALE 2 PUFFS INTO THE LUNGS EVERY 6 HOURS AS NEEDED FOR WHEEZING OR SHORTNESS OF  BREATH 6.7 g 3   ALPRAZolam (XANAX) 0.25 MG tablet Take 0.5 tablets (0.125 mg total) by mouth at bedtime as needed. (Patient taking differently: Take 0.125 mg by mouth at bedtime as needed for anxiety or sleep.) 30 tablet 3   atovaquone (MEPRON) 750 MG/5ML suspension Take 750 mg by mouth at bedtime.     diltiazem (CARDIZEM CD) 120 MG 24 hr capsule Take 120 mg by mouth daily.     fluticasone (FLONASE) 50 MCG/ACT nasal spray INSTILL 1 SPRAY INTO EACH NOSTRIL TWICE A DAY IF NEEDED (Patient taking differently: Place 1 spray into both nostrils daily as needed for allergies.) 48 g 3   furosemide (LASIX) 20 MG tablet Take 1 tablet by mouth once daily 90 tablet 1   meclizine (ANTIVERT) 12.5 MG tablet Take 1-2 tablets (12.5-25 mg total) by mouth 3 (three) times daily as needed for dizziness. 30 tablet 1   methylPREDNISolone (MEDROL) 4 MG tablet Take 1 tablet by mouth daily. For 60 days     montelukast (SINGULAIR) 10 MG tablet TAKE 1 TABLET BY MOUTH AT BEDTIME 90 tablet 0   Mouthwashes (DRY MOUTH MOUTHWASH) LIQD Use as directed 5 mLs in the mouth or throat daily as needed (dry mouth). Biotene     Multiple Vitamins-Iron (MULTIVITAMIN/IRON PO) Take 1 tablet by mouth daily.     OPSUMIT 10 MG TABS Take 10 mg by mouth daily.     OXYGEN Inhale into the lungs. 2 LMP at night and upon exertion     pantoprazole (PROTONIX) 40 MG tablet Take 1 tablet (40 mg total) by mouth daily. 30 tablet 1   spironolactone (ALDACTONE) 25  MG tablet Take 12.5 mg by mouth daily.     sucralfate (CARAFATE) 1 GM/10ML suspension Take ml's by mouth at bedtime 900 mL 3   No current facility-administered medications for this visit.    Allergies  Allergen Reactions   Codeine Nausea Only    Hallucinations, too   Other Nausea And Vomiting    "-mycin" antibiotics.   Also cause hallucinations.   Erythromycin Nausea And Vomiting   Lisinopril     Pt doesn't remember    Mycophenolate Mofetil Nausea Only   Sulfa Antibiotics Other (See  Comments)    unknown     Past Medical History:  Diagnosis Date   Anemia    Angiodysplasia of stomach and duodenum    Arthus phenomenon    AVM (arteriovenous malformation)    Blood transfusion without reported diagnosis    last 4 units 12-22-15, Iron infusion x2 last -01-07-16,01-14-16.   Breast cancer ([place]) 1989   Left   Candida esophagitis (HCC)    Cataract    Chronic kidney disease    Chronic mild renal insuffiency   CREST syndrome (HCC)    GERD (gastroesophageal reflux disease)    w/ HH   Interstitial lung disease (HCC)    Nodule of kidney    Pericardial effusion    PONV (postoperative nausea and vomiting)    Pulmonary embolus ([place]) 2003   Pulmonary hypertension ([place])    followed by Dr Gaynell Face at [facility], now Dr. Maryjean Ka visit end 2'17.   Rectal incontinence    Renal cell carcinoma (HCC)    Scleroderma (HCC)    Tubular adenoma of colon    Uterine polyp     Past Surgical History:  Procedure Laterality Date   APPENDECTOMY  1962   BREAST LUMPECTOMY  1989   left   CATARACT EXTRACTION, BILATERAL Bilateral 12/2013   COLONOSCOPY WITH PROPOFOL N/A 04/15/2021   Procedure: COLONOSCOPY WITH PROPOFOL;  Surgeon: Milus Banister, MD;  Location: [facility];  Service: Endoscopy;  Laterality: N/A;   ENTEROSCOPY N/A 01/18/2016   Procedure: ENTEROSCOPY;  Surgeon: Mauri Pole, MD;  Location: [facility];  Service: Endoscopy;  Laterality: N/A;   ENTEROSCOPY N/A 05/24/2018   Procedure: ENTEROSCOPY;  Surgeon: Jackquline Denmark, MD;  Location: [facility];  Service: Endoscopy;  Laterality: N/A;   ENTEROSCOPY N/A 03/29/2021   Procedure: ENTEROSCOPY;  Surgeon: Jerene Bears, MD;  Location: [facility];  Service: Gastroenterology;  Laterality: N/A;   ENTEROSCOPY N/A 04/13/2021   Procedure: ENTEROSCOPY;  Surgeon: Yetta Flock, MD;  Location: [facility];  Service: Gastroenterology;  Laterality: N/A;   ESOPHAGOGASTRODUODENOSCOPY (EGD) WITH PROPOFOL N/A 12/21/2015   Procedure:  ESOPHAGOGASTRODUODENOSCOPY (EGD) WITH PROPOFOL;  Surgeon: Irene Shipper, MD;  Location: [facility];  Service: Endoscopy;  Laterality: N/A;   HOT HEMOSTASIS N/A 05/24/2018   Procedure: HOT HEMOSTASIS (ARGON PLASMA COAGULATION/BICAP);  Surgeon: Jackquline Denmark, MD;  [facility];  Service: Endoscopy;  Laterality: N/A;   HOT HEMOSTASIS N/A 03/29/2021   Procedure: HOT HEMOSTASIS (ARGON PLASMA COAGULATION/BICAP);  Surgeon: Jerene Bears, MD;  Location: [facility];  Service: Gastroenterology;  Laterality: N/A;   HOT HEMOSTASIS N/A 04/13/2021   Procedure: HOT HEMOSTASIS (ARGON PLASMA COAGULATION/BICAP);  Surgeon: Yetta Flock, MD;  Location: [facility];  Service: Gastroenterology;  Laterality: N/A;   IR GENERIC HISTORICAL  06/05/2016   IR RADIOLOGIST EVAL & MGMT 06/05/2016 Aletta Edouard, MD [facility] INTERV RAD   IVC Filter     KIDNEY SURGERY  left -"laser surgery by Dr. Kathlene Cote- 5 yrs ago" no removal   SCHLEROTHERAPY  05/24/2018   Procedure: SCHLEROTHERAPY;  Surgeon: Jackquline Denmark, MD;  [facility]: [facility];  Service: Endoscopy;;   SUBMUCOSAL TATTOO INJECTION  04/13/2021   Procedure: SUBMUCOSAL TATTOO INJECTION;  Surgeon: Yetta Flock, MD;  Location: [facility];  Service: Gastroenterology;;   TONSILLECTOMY     TUBAL LIGATION      Social History   Socioeconomic History   Marital status: Married    Spouse name: Not on file   Number of children: 2   Years of education: Not on file   Highest education level: Not on file  Occupational History   Occupation: Retired  Tobacco Use   Smoking status: Never   Smokeless tobacco: Never  Vaping Use   Vaping Use: Never used  Substance and Sexual Activity   Alcohol use: No   Drug use: No   Sexual activity: Not Currently  Other Topics Concern   Not on file  Social History Narrative   Married '61   1 son- '65, 1 daughter '63; 6 children (2 adopted)   SO- SOB   Retirement- doing well   Marriage in good health   Patient  has never smoked   Alcohol use- no   Pt gets regular exercise         Social Determinants of Radio broadcast assistant Strain: Not on file  Food Insecurity: Not on file  Transportation Needs: Not on file  Physical Activity: Not on file  Stress: Not on file  Social Connections: Not on file  Intimate Partner Violence: Not on file    Family History  Problem Relation Age of Onset   Bladder Cancer Father    Diabetes Father    Prostate cancer Father    Alzheimer's disease Mother    Diabetes Sister    Lung cancer Sister         smoker   Esophageal cancer Paternal Uncle    Colon cancer Neg Hx     ROS: no fevers or chills, productive cough, hemoptysis, dysphasia, odynophagia, melena, hematochezia, dysuria, hematuria, rash, seizure activity, orthopnea, PND, pedal edema, claudication. Remaining systems are negative.  Physical Exam:   Blood pressure 130/62, pulse 74, height _0  (1.575 m), weight 103 lb 3.2 oz (46.8 kg), SpO2 95 %.  General:  Well developed/frail in NAD Skin warm/dry Patient not depressed No peripheral clubbing Back-normal HEENT-normal/normal eyelids Neck supple/normal carotid upstroke bilaterally; no bruits; no JVD; no thyromegaly chest -dry crackles at the bases bilaterally. CV - RRR/normal S1 and S2; no rubs or gallops;  PMI nondisplaced; 3/6 systolic murmur left sternal border.  S2 is not diminished. Abdomen -NT/ND, no HSM, no mass, + bowel sounds, no bruit 2+ femoral pulses, no bruits Ext-no edema, chords, 2+ DP Neuro-grossly nonfocal  ECG -normal sinus rhythm at a rate of 74, cannot rule out anterior infarct and inferior infarct.  Personally reviewed  A/P  1 large pericardial fusion-longstanding with no evidence of tamponade.  Will need follow-up echoes in the future.  No indication for pericardiocentesis or pericardial window at this point.  2 pulmonary hypertension-this is felt secondary to history of crest syndrome with interstitial lung  disease and history of pulmonary embolus.  Continue present medications.  Follow-up with her pulmonologist and Dr. Gilles Chiquito at [facility]  She is scheduled for right heart catheterization later this week.  3 valvular heart disease with moderate aortic stenosis/moderate mitral stenosis-plan follow-up echocardiogram January 2024.  4 hypertension-patient's blood pressure is controlled.  Continue present medical regimen.  5 history of AVMs with GI bleeding-patient is followed by gastroenterology.  Kirk Ruths, MD

## 2021-11-11 ENCOUNTER — Other Ambulatory Visit: Payer: Self-pay

## 2021-11-11 ENCOUNTER — Ambulatory Visit (INDEPENDENT_AMBULATORY_CARE_PROVIDER_SITE_OTHER): Payer: Medicare PPO

## 2021-11-11 VITALS — BP 108/60 | HR 66 | Temp 98.5°F | Resp 16 | Ht 64.0 in | Wt 104.8 lb

## 2021-11-11 DIAGNOSIS — D500 Iron deficiency anemia secondary to blood loss (chronic): Secondary | ICD-10-CM | POA: Diagnosis not present

## 2021-11-11 DIAGNOSIS — N183 Chronic kidney disease, stage 3 unspecified: Secondary | ICD-10-CM

## 2021-11-11 MED ORDER — EPINEPHRINE 0.3 MG/0.3ML IJ SOAJ: 0.3000 mg | Freq: Once | INTRAMUSCULAR | Status: DC | PRN

## 2021-11-11 MED ORDER — SODIUM CHLORIDE 0.9 % IV SOLN: Freq: Once | INTRAVENOUS | Status: DC | PRN

## 2021-11-11 MED ORDER — DIPHENHYDRAMINE HCL 50 MG/ML IJ SOLN: 50.0000 mg | Freq: Once | INTRAMUSCULAR | Status: DC | PRN

## 2021-11-11 MED ORDER — FAMOTIDINE IN NACL 20-0.9 MG/50ML-% IV SOLN: 20.0000 mg | Freq: Once | INTRAVENOUS | Status: DC | PRN

## 2021-11-11 MED ORDER — SODIUM CHLORIDE 0.9 % IV SOLN
510.0000 mg | Freq: Once | INTRAVENOUS | Status: AC
  Administered 2021-11-11: 510 mg via INTRAVENOUS
  Filled 2021-11-11: qty 17

## 2021-11-11 MED ORDER — METHYLPREDNISOLONE SODIUM SUCC 125 MG IJ SOLR: 125.0000 mg | Freq: Once | INTRAMUSCULAR | Status: DC | PRN

## 2021-11-11 MED ORDER — ALBUTEROL SULFATE HFA 108 (90 BASE) MCG/ACT IN AERS: 2.0000 | INHALATION_SPRAY | Freq: Once | RESPIRATORY_TRACT | Status: DC | PRN

## 2021-11-11 NOTE — Progress Notes (Signed)
Diagnosis: Iron Deficiency Anemia  Provider:  Marshell Garfinkel, MD  Procedure: Infusion  IV Type: Peripheral, IV Location: R Antecubital  Feraheme (Ferumoxytol), Dose: 510 mg  Infusion Start Time: 7076  Infusion Stop Time: 1510  Post Infusion IV Care: Peripheral IV Discontinued  Discharge: Condition: Good, Destination: Home . AVS provided to patient.   Performed by:  Koren Shiver, RN

## 2021-11-15 ENCOUNTER — Other Ambulatory Visit: Payer: Self-pay

## 2021-11-15 ENCOUNTER — Other Ambulatory Visit (INDEPENDENT_AMBULATORY_CARE_PROVIDER_SITE_OTHER): Payer: Medicare PPO

## 2021-11-15 DIAGNOSIS — N1831 Chronic kidney disease, stage 3a: Secondary | ICD-10-CM

## 2021-11-15 LAB — COMPREHENSIVE METABOLIC PANEL
ALT: 10 U/L (ref 0–35)
AST: 19 U/L (ref 0–37)
Albumin: 4.1 g/dL (ref 3.5–5.2)
Alkaline Phosphatase: 55 U/L (ref 39–117)
BUN: 34 mg/dL — ABNORMAL HIGH (ref 6–23)
CO2: 32 mEq/L (ref 19–32)
Calcium: 9.3 mg/dL (ref 8.4–10.5)
Chloride: 100 mEq/L (ref 96–112)
Creatinine, Ser: 1.34 mg/dL — ABNORMAL HIGH (ref 0.40–1.20)
GFR: 36.74 mL/min — ABNORMAL LOW (ref >60.00)
Glucose, Bld: 104 mg/dL — ABNORMAL HIGH (ref 70–99)
Potassium: 4.6 mEq/L (ref 3.5–5.1)
Sodium: 138 mEq/L (ref 135–145)
Total Bilirubin: 0.5 mg/dL (ref 0.2–1.2)
Total Protein: 6.5 g/dL (ref 6.0–8.3)

## 2021-11-15 LAB — CBC WITH DIFFERENTIAL/PLATELET
Basophils Absolute: 0 10*3/uL (ref 0.0–0.1)
Basophils Relative: 0.6 % (ref 0.0–3.0)
Eosinophils Absolute: 0 10*3/uL (ref 0.0–0.7)
Eosinophils Relative: 0.6 % (ref 0.0–5.0)
HCT: 30.6 % — ABNORMAL LOW (ref 36.0–46.0)
Hemoglobin: 10.1 g/dL — ABNORMAL LOW (ref 12.0–15.0)
Lymphocytes Relative: 12.7 % (ref 12.0–46.0)
Lymphs Abs: 0.9 10*3/uL (ref 0.7–4.0)
MCHC: 33 g/dL (ref 30.0–36.0)
MCV: 104.8 fl — ABNORMAL HIGH (ref 78.0–100.0)
Monocytes Absolute: 0.4 10*3/uL (ref 0.1–1.0)
Monocytes Relative: 6.2 % (ref 3.0–12.0)
Neutro Abs: 5.6 10*3/uL (ref 1.4–7.7)
Neutrophils Relative %: 79.9 % — ABNORMAL HIGH (ref 43.0–77.0)
Platelets: 174 10*3/uL (ref 150.0–400.0)
RBC: 2.92 Mil/uL — ABNORMAL LOW (ref 3.87–5.11)
RDW: 14.8 % (ref 11.5–15.5)
WBC: 7 10*3/uL (ref 4.0–10.5)

## 2021-11-18 DIAGNOSIS — Z85528 Personal history of other malignant neoplasm of kidney: Secondary | ICD-10-CM | POA: Diagnosis not present

## 2021-11-18 DIAGNOSIS — D631 Anemia in chronic kidney disease: Secondary | ICD-10-CM | POA: Diagnosis not present

## 2021-11-18 DIAGNOSIS — M349 Systemic sclerosis, unspecified: Secondary | ICD-10-CM | POA: Diagnosis not present

## 2021-11-18 DIAGNOSIS — I129 Hypertensive chronic kidney disease with stage 1 through stage 4 chronic kidney disease, or unspecified chronic kidney disease: Secondary | ICD-10-CM | POA: Diagnosis not present

## 2021-11-18 DIAGNOSIS — N189 Chronic kidney disease, unspecified: Secondary | ICD-10-CM | POA: Diagnosis not present

## 2021-11-18 DIAGNOSIS — I272 Pulmonary hypertension, unspecified: Secondary | ICD-10-CM | POA: Diagnosis not present

## 2021-11-18 DIAGNOSIS — N1832 Chronic kidney disease, stage 3b: Secondary | ICD-10-CM | POA: Diagnosis not present

## 2021-11-19 ENCOUNTER — Ambulatory Visit: Payer: Medicare PPO | Admitting: Cardiology

## 2021-11-19 ENCOUNTER — Other Ambulatory Visit: Payer: Self-pay

## 2021-11-19 ENCOUNTER — Encounter: Payer: Self-pay | Admitting: Cardiology

## 2021-11-19 VITALS — BP 130/62 | HR 74 | Ht 62.0 in | Wt 103.2 lb

## 2021-11-19 DIAGNOSIS — I10 Essential (primary) hypertension: Secondary | ICD-10-CM

## 2021-11-19 DIAGNOSIS — I350 Nonrheumatic aortic (valve) stenosis: Secondary | ICD-10-CM | POA: Diagnosis not present

## 2021-11-19 DIAGNOSIS — I272 Pulmonary hypertension, unspecified: Secondary | ICD-10-CM | POA: Diagnosis not present

## 2021-11-19 DIAGNOSIS — I3139 Other pericardial effusion (noninflammatory): Secondary | ICD-10-CM

## 2021-11-19 NOTE — Patient Instructions (Addendum)
Follow-Up: At [facility], you and your health needs are our priority.  As part of our continuing mission to provide you with exceptional heart care, we have created designated Provider Care Teams.  These Care Teams include your primary Cardiologist (physician) and Advanced Practice Providers (APPs -  Physician Assistants and Nurse Practitioners) who all work together to provide you with the care you need, when you need it.  We recommend signing up for the patient portal called "MyChart".  Sign up information is provided on this After Visit Summary.  MyChart is used to connect with patients for Virtual Visits (Telemedicine).  Patients are able to view lab/test results, encounter notes, upcoming appointments, etc.  Non-urgent messages can be sent to your provider as well.   To learn more about what you can do with MyChart, go to [email or web address].    Your next appointment:   6 month(s)  The format for your next appointment:   In Person  Provider:  Kirk Ruths MD  }

## 2021-11-20 ENCOUNTER — Other Ambulatory Visit: Payer: Self-pay | Admitting: Nephrology

## 2021-11-20 DIAGNOSIS — N1832 Chronic kidney disease, stage 3b: Secondary | ICD-10-CM

## 2021-11-20 DIAGNOSIS — I129 Hypertensive chronic kidney disease with stage 1 through stage 4 chronic kidney disease, or unspecified chronic kidney disease: Secondary | ICD-10-CM

## 2021-11-22 DIAGNOSIS — I270 Primary pulmonary hypertension: Secondary | ICD-10-CM | POA: Diagnosis not present

## 2021-11-22 DIAGNOSIS — J849 Interstitial pulmonary disease, unspecified: Secondary | ICD-10-CM | POA: Diagnosis not present

## 2021-11-22 DIAGNOSIS — I272 Pulmonary hypertension, unspecified: Secondary | ICD-10-CM | POA: Diagnosis not present

## 2021-11-22 DIAGNOSIS — I350 Nonrheumatic aortic (valve) stenosis: Secondary | ICD-10-CM | POA: Diagnosis not present

## 2021-11-22 DIAGNOSIS — R9431 Abnormal electrocardiogram [ECG] [EKG]: Secondary | ICD-10-CM | POA: Diagnosis not present

## 2021-11-22 DIAGNOSIS — I3139 Other pericardial effusion (noninflammatory): Secondary | ICD-10-CM | POA: Diagnosis not present

## 2021-11-22 DIAGNOSIS — M349 Systemic sclerosis, unspecified: Secondary | ICD-10-CM | POA: Diagnosis not present

## 2021-11-22 DIAGNOSIS — I342 Nonrheumatic mitral (valve) stenosis: Secondary | ICD-10-CM | POA: Diagnosis not present

## 2021-11-29 DIAGNOSIS — Z1231 Encounter for screening mammogram for malignant neoplasm of breast: Secondary | ICD-10-CM | POA: Diagnosis not present

## 2021-12-11 ENCOUNTER — Other Ambulatory Visit: Payer: Self-pay | Admitting: Internal Medicine

## 2021-12-16 ENCOUNTER — Other Ambulatory Visit: Payer: Self-pay

## 2021-12-16 ENCOUNTER — Other Ambulatory Visit (INDEPENDENT_AMBULATORY_CARE_PROVIDER_SITE_OTHER): Payer: Medicare PPO

## 2021-12-16 DIAGNOSIS — N1831 Chronic kidney disease, stage 3a: Secondary | ICD-10-CM

## 2021-12-16 LAB — CBC WITH DIFFERENTIAL/PLATELET
Basophils Absolute: 0 10*3/uL (ref 0.0–0.1)
Basophils Relative: 0.4 % (ref 0.0–3.0)
Eosinophils Absolute: 0 10*3/uL (ref 0.0–0.7)
Eosinophils Relative: 0.2 % (ref 0.0–5.0)
HCT: 28.5 % — ABNORMAL LOW (ref 36.0–46.0)
Hemoglobin: 9.6 g/dL — ABNORMAL LOW (ref 12.0–15.0)
Lymphocytes Relative: 9.6 % — ABNORMAL LOW (ref 12.0–46.0)
Lymphs Abs: 0.7 10*3/uL (ref 0.7–4.0)
MCHC: 33.7 g/dL (ref 30.0–36.0)
MCV: 106.9 fl — ABNORMAL HIGH (ref 78.0–100.0)
Monocytes Absolute: 0.3 10*3/uL (ref 0.1–1.0)
Monocytes Relative: 3.7 % (ref 3.0–12.0)
Neutro Abs: 6.5 10*3/uL (ref 1.4–7.7)
Neutrophils Relative %: 86.1 % — ABNORMAL HIGH (ref 43.0–77.0)
Platelets: 185 10*3/uL (ref 150.0–400.0)
RBC: 2.66 Mil/uL — ABNORMAL LOW (ref 3.87–5.11)
RDW: 14.6 % (ref 11.5–15.5)
WBC: 7.5 10*3/uL (ref 4.0–10.5)

## 2021-12-16 LAB — COMPREHENSIVE METABOLIC PANEL
ALT: 9 U/L (ref 0–35)
AST: 18 U/L (ref 0–37)
Albumin: 4.4 g/dL (ref 3.5–5.2)
Alkaline Phosphatase: 52 U/L (ref 39–117)
BUN: 38 mg/dL — ABNORMAL HIGH (ref 6–23)
CO2: 30 mEq/L (ref 19–32)
Calcium: 9.4 mg/dL (ref 8.4–10.5)
Chloride: 99 mEq/L (ref 96–112)
Creatinine, Ser: 1.3 mg/dL — ABNORMAL HIGH (ref 0.40–1.20)
GFR: 38.08 mL/min — ABNORMAL LOW (ref >60.00)
Glucose, Bld: 164 mg/dL — ABNORMAL HIGH (ref 70–99)
Potassium: 4.1 mEq/L (ref 3.5–5.1)
Sodium: 136 mEq/L (ref 135–145)
Total Bilirubin: 0.5 mg/dL (ref 0.2–1.2)
Total Protein: 6.7 g/dL (ref 6.0–8.3)

## 2021-12-17 ENCOUNTER — Encounter (HOSPITAL_COMMUNITY): Payer: Self-pay

## 2021-12-18 ENCOUNTER — Ambulatory Visit
Admission: RE | Admit: 2021-12-18 | Discharge: 2021-12-18 | Disposition: A | Discharge status: Home / Self Care | Payer: Medicare PPO | Source: Ambulatory Visit | Attending: Nephrology | Admitting: Nephrology

## 2021-12-18 DIAGNOSIS — N281 Cyst of kidney, acquired: Secondary | ICD-10-CM | POA: Diagnosis not present

## 2021-12-18 DIAGNOSIS — N261 Atrophy of kidney (terminal): Secondary | ICD-10-CM | POA: Diagnosis not present

## 2021-12-18 DIAGNOSIS — I129 Hypertensive chronic kidney disease with stage 1 through stage 4 chronic kidney disease, or unspecified chronic kidney disease: Secondary | ICD-10-CM

## 2021-12-18 DIAGNOSIS — N1832 Chronic kidney disease, stage 3b: Secondary | ICD-10-CM

## 2021-12-20 ENCOUNTER — Telehealth (HOSPITAL_COMMUNITY): Payer: Self-pay

## 2021-12-20 NOTE — Telephone Encounter (Signed)
Pt returned PR phone call and stated she is interested in PR. Patient will come in for orientation on 01/06/22 @ 9AM and will attend the 10:15AM exercise class. ?  ?Mailed package ?

## 2021-12-25 ENCOUNTER — Encounter: Payer: Self-pay | Admitting: Internal Medicine

## 2021-12-31 DIAGNOSIS — H401431 Capsular glaucoma with pseudoexfoliation of lens, bilateral, mild stage: Secondary | ICD-10-CM | POA: Diagnosis not present

## 2022-01-01 ENCOUNTER — Other Ambulatory Visit (INDEPENDENT_AMBULATORY_CARE_PROVIDER_SITE_OTHER): Payer: Medicare PPO

## 2022-01-01 DIAGNOSIS — D500 Iron deficiency anemia secondary to blood loss (chronic): Secondary | ICD-10-CM | POA: Diagnosis not present

## 2022-01-01 DIAGNOSIS — Z8774 Personal history of (corrected) congenital malformations of heart and circulatory system: Secondary | ICD-10-CM | POA: Diagnosis not present

## 2022-01-01 LAB — IBC + FERRITIN
Ferritin: 547.9 ng/mL — ABNORMAL HIGH (ref 10.0–291.0)
Iron: 105 ug/dL (ref 42–145)
Saturation Ratios: 41 % (ref 20.0–50.0)
TIBC: 256.2 ug/dL (ref 250.0–450.0)
Transferrin: 183 mg/dL — ABNORMAL LOW (ref 212.0–360.0)

## 2022-01-02 ENCOUNTER — Telehealth (HOSPITAL_COMMUNITY): Payer: Self-pay | Admitting: *Deleted

## 2022-01-03 ENCOUNTER — Ambulatory Visit (HOSPITAL_COMMUNITY)
Admission: RE | Admit: 2022-01-03 | Discharge: 2022-01-03 | Disposition: A | Discharge status: Home / Self Care | Payer: Medicare PPO | Source: Ambulatory Visit | Attending: Nephrology | Admitting: Nephrology

## 2022-01-03 ENCOUNTER — Other Ambulatory Visit: Payer: Self-pay

## 2022-01-03 VITALS — BP 129/39 | HR 80 | Temp 98.1°F | Resp 18

## 2022-01-03 DIAGNOSIS — N1832 Chronic kidney disease, stage 3b: Secondary | ICD-10-CM | POA: Insufficient documentation

## 2022-01-03 DIAGNOSIS — D638 Anemia in other chronic diseases classified elsewhere: Secondary | ICD-10-CM | POA: Diagnosis not present

## 2022-01-03 LAB — POCT HEMOGLOBIN-HEMACUE: Hemoglobin: 9.8 g/dL — ABNORMAL LOW (ref 12.0–15.0)

## 2022-01-03 MED ORDER — EPOETIN ALFA-EPBX 10000 UNIT/ML IJ SOLN
10000.0000 [IU] | INTRAMUSCULAR | Status: DC
  Administered 2022-01-03: 10000 [IU] via SUBCUTANEOUS

## 2022-01-03 MED ORDER — EPOETIN ALFA-EPBX 10000 UNIT/ML IJ SOLN
INTRAMUSCULAR | Status: AC
  Filled 2022-01-03: qty 1

## 2022-01-06 ENCOUNTER — Other Ambulatory Visit: Payer: Self-pay

## 2022-01-06 ENCOUNTER — Encounter (HOSPITAL_COMMUNITY)
Admission: RE | Admit: 2022-01-06 | Discharge: 2022-01-06 | Disposition: A | Discharge status: Home / Self Care | Payer: Medicare PPO | Source: Ambulatory Visit | Attending: Pulmonary Disease | Admitting: Pulmonary Disease

## 2022-01-06 ENCOUNTER — Encounter (HOSPITAL_COMMUNITY): Payer: Self-pay

## 2022-01-06 VITALS — BP 100/58 | HR 75 | Ht 63.0 in | Wt 102.5 lb

## 2022-01-06 DIAGNOSIS — J849 Interstitial pulmonary disease, unspecified: Secondary | ICD-10-CM | POA: Diagnosis not present

## 2022-01-06 NOTE — Progress Notes (Signed)
Pulmonary Rehab Orientation Physical Assessment Note ? ?Physical assessment reveals  Pt who is thin appearing is alert and oriented x 3.  Heart rate is normal with murmer.  Pt acknowledges she is aware of this and is under the care of a cardiologist - Dr. Stanford Breed who is monitoring this. breath sounds clear to auscultation, no wheezes, rales, or rhonchi, typical anticipated "crackly" sounds hear with pts who have pulmonary fibrosis. Reports non-productive cough. Bowel sounds present.  Pt denies abdominal discomfort, nausea, vomiting or diarrhea. Grip strength equal, strong. Distal pulses palpable; no swelling to lower extremities presently however she does report that she does have swelling from time to time and takes 2 diuretics. Cherre Huger, BSN ?Cardiac and Pulmonary Rehab Nurse Navigator   ?

## 2022-01-06 NOTE — Progress Notes (Signed)
Pulmonary Individual Treatment Plan ? ?Patient Details  ?Name: Paula Ward ?MRN: [id number] ?Date of Birth: [birth year] ?Referring Provider:   ?Flowsheet Row Pulmonary Rehab Walk Test from 01/06/2022 in [place]  ?Referring Provider [name]  ? ?  ? ? ?Initial Encounter Date:  ?Flowsheet Row Pulmonary Rehab Walk Test from 01/06/2022 in [place]  ?Date 01/06/22  ? ?  ? ? ?Visit Diagnosis: Interstitial lung disease ([place]) ? ?Patient's Home Medications on Admission:  ? ?Current Outpatient Medications:  ?  acetaminophen (TYLENOL) 325 MG tablet, Take 650 mg by mouth every 6 (six) hours as needed for mild pain. , Disp: , Rfl:  ?  albuterol (VENTOLIN HFA) 108 (90 Base) MCG/ACT inhaler, INHALE 2 PUFFS INTO THE LUNGS EVERY 6 HOURS AS NEEDED FOR WHEEZING OR SHORTNESS OF BREATH, Disp: 6.7 g, Rfl: 3 ?  ALPRAZolam (XANAX) 0.25 MG tablet, Take 0.5 tablets (0.125 mg total) by mouth at bedtime as needed. (Patient taking differently: Take 0.125 mg by mouth at bedtime as needed for anxiety or sleep.), Disp: 30 tablet, Rfl: 3 ?  atovaquone (MEPRON) 750 MG/5ML suspension, Take 750 mg by mouth at bedtime., Disp: , Rfl:  ?  diltiazem (CARDIZEM CD) 120 MG 24 hr capsule, Take 120 mg by mouth daily., Disp: , Rfl:  ?  fluticasone (FLONASE) 50 MCG/ACT nasal spray, INSTILL 1 SPRAY INTO EACH NOSTRIL TWICE A DAY IF NEEDED (Patient taking differently: Place 1 spray into both nostrils daily as needed for allergies.), Disp: 48 g, Rfl: 3 ?  furosemide (LASIX) 20 MG tablet, Take 1 tablet by mouth once daily, Disp: 90 tablet, Rfl: 0 ?  montelukast (SINGULAIR) 10 MG tablet, TAKE 1 TABLET BY MOUTH AT BEDTIME, Disp: 90 tablet, Rfl: 0 ?  Mouthwashes (DRY MOUTH MOUTHWASH) LIQD, Use as directed 5 mLs in the mouth or throat daily as needed (dry mouth). Biotene, Disp: , Rfl:  ?  Multiple Vitamins-Iron (MULTIVITAMIN/IRON PO), Take 1 tablet by mouth daily., Disp: , Rfl:  ?  OPSUMIT  10 MG TABS, Take 10 mg by mouth daily., Disp: , Rfl:  ?  OXYGEN, Inhale into the lungs. 2 LMP at night and upon exertion, Disp: , Rfl:  ?  pantoprazole (PROTONIX) 40 MG tablet, Take 1 tablet (40 mg total) by mouth daily., Disp: 30 tablet, Rfl: 1 ?  spironolactone (ALDACTONE) 25 MG tablet, Take 12.5 mg by mouth daily., Disp: , Rfl:  ?  sucralfate (CARAFATE) 1 GM/10ML suspension, Take ml's by mouth at bedtime, Disp: 900 mL, Rfl: 3 ?  ACCU-CHEK AVIVA PLUS test strip, , Disp: , Rfl:  ?  meclizine (ANTIVERT) 12.5 MG tablet, Take 1-2 tablets (12.5-25 mg total) by mouth 3 (three) times daily as needed for dizziness. (Patient not taking: Reported on 01/06/2022), Disp: 30 tablet, Rfl: 1 ? ?Past Medical History: ?Past Medical History:  ?Diagnosis Date  ? Anemia   ? Angiodysplasia of stomach and duodenum   ? Arthus phenomenon   ? AVM (arteriovenous malformation)   ? Blood transfusion without reported diagnosis   ? last 4 units 12-22-15, Iron infusion x2 last -01-07-16,01-14-16.  ? Breast cancer ([place]) 1989  ? Left  ? Candida esophagitis ([place])   ? Cataract   ? Chronic kidney disease   ? Chronic mild renal insuffiency  ? CREST syndrome ([place])   ? GERD (gastroesophageal reflux disease)   ? w/ HH  ? Interstitial lung disease ([place])   ? Nodule of kidney   ?  Pericardial effusion   ? PONV (postoperative nausea and vomiting)   ? Pulmonary embolus ([place]) 2003  ? Pulmonary hypertension ([place])   ? followed by Dr Gaynell Face at [facility], now Dr. Maryjean Ka visit end 2'17.  ? Rectal incontinence   ? Renal cell carcinoma ([place])   ? Scleroderma ([place])   ? Tubular adenoma of colon   ? Uterine polyp   ? ? ?Tobacco Use: ?Social History  ? ?Tobacco Use  ?Smoking Status Never  ?Smokeless Tobacco Never  ? ? ?Labs: ?Recent Review Flowsheet Data   ? ? Labs for ITP Cardiac and Pulmonary Rehab Latest Ref Rng & Units 01/30/2014 02/11/2017 06/24/2018 01/06/2020 03/29/2021  ? Cholestrol 0 - 200 mg/dL 180 199 158 - -  ? LDLCALC 0 - 99 mg/dL 111(H) 115(H) 78 - -  ? HDL >39.00  mg/dL 51.90 60.70 57.50 - -  ? Trlycerides 0.0 - 149.0 mg/dL 87.0 115.0 112.0 - -  ? Hemoglobin A1c 4.6 - 6.5 % - - - 5.2 -  ? HCO3 20.0 - 28.0 mmol/L - - - - 26.1  ? TCO2 0 - 100 mmol/L - - - - -  ? O2SAT % - - - - 97.1  ? ?  ? ? ?Capillary Blood Glucose: ?Lab Results  ?Component Value Date  ? GLUCAP 81 12/21/2015  ? GLUCAP 91 12/21/2015  ? GLUCAP 71 12/21/2015  ? GLUCAP 85 12/21/2015  ? GLUCAP 91 12/21/2015  ? ? ? ?Pulmonary Assessment Scores: ? Pulmonary Assessment Scores   ? ? [place] Name 01/06/22 0930  ?  ?  ?  ? ADL UCSD  ? ADL Phase Entry    ? SOB Score total 11    ?  ? CAT Score  ? CAT Score 7    ?  ? mMRC Score  ? mMRC Score 1    ? ?  ?  ? ?  ? ?UCSD: ?Self-administered rating of dyspnea associated with activities of daily living (ADLs) ?6-point scale (0 = "not at all" to 5 = "maximal or unable to do because of breathlessness")  ?Scoring Scores range from 0 to 120.  Minimally important difference is 5 units ? ?CAT: ?CAT can identify the health impairment of COPD patients and is better correlated with disease progression.  ?CAT has a scoring range of zero to 40. The CAT score is classified into four groups of low (less than 10), medium (10 - 20), high (21-30) and very high (31-40) based on the impact level of disease on health status. A CAT score over 10 suggests significant symptoms.  A worsening CAT score could be explained by an exacerbation, poor medication adherence, poor inhaler technique, or progression of COPD or comorbid conditions.  ?CAT MCID is 2 points ? ?mMRC: ?mMRC (Modified Medical Research Council) Dyspnea Scale is used to assess the degree of baseline functional disability in patients of respiratory disease due to dyspnea. ?No minimal important difference is established. A decrease in score of 1 point or greater is considered a positive change.  ? ?Pulmonary Function Assessment: ? Pulmonary Function Assessment - 01/06/22 0947   ? ?  ? Breath  ? Bilateral Breath Sounds Rales   ? Shortness of  Breath Yes   ? ?  ?  ? ?  ? ? ?Exercise Target Goals: ?Exercise Program Goal: ?Individual exercise prescription set using results from initial 6 min walk test and THRR while considering  patient?s activity barriers and safety.  ? ?Exercise Prescription Goal: ?Initial exercise prescription  builds to 30-45 minutes a day of aerobic activity, 2-3 days per week.  Home exercise guidelines will be given to patient during program as part of exercise prescription that the participant will acknowledge. ? ?Activity Barriers & Risk Stratification: ? Activity Barriers & Cardiac Risk Stratification - 01/06/22 0936   ? ?  ? Activity Barriers & Cardiac Risk Stratification  ? Activity Barriers Arthritis;Neck/Spine Problems;History of Falls;Balance Concerns;Muscular Weakness;Deconditioning;Shortness of Breath   ? Cardiac Risk Stratification Moderate   ? ?  ?  ? ?  ? ? ?6 Minute Walk: ? 6 Minute Walk   ? ? [place] Name 01/06/22 1208  ?  ?  ?  ? 6 Minute Walk  ? Phase Initial    ? Distance 1230 feet    ? Walk Time 6 minutes    ? # of Rest Breaks 0    ? MPH 2.39    ? METS 2.57    ? RPE 9.5    ? Perceived Dyspnea  0.5    ? VO2 Peak 8.98    ? Symptoms No    ? Resting HR 71 bpm    ? Resting BP 100/58    ? Resting Oxygen Saturation  99 %    ? Exercise Oxygen Saturation  during 6 min walk 89 %    ? Max Ex. HR 118 bpm    ? Max Ex. BP 116/60    ? 2 Minute Post BP 108/50    ?  ? Interval HR  ? 1 Minute HR 111    ? 2 Minute HR 111    ? 3 Minute HR 111    ? 4 Minute HR 113    ? 5 Minute HR 112    ? 6 Minute HR 118    ? 2 Minute Post HR 101    ? Interval Heart Rate? Yes    ?  ? Interval Oxygen  ? Interval Oxygen? Yes    ? Baseline Oxygen Saturation % 99 %    ? 1 Minute Oxygen Saturation % 94 %    ? 1 Minute Liters of Oxygen 0 L    ? 2 Minute Oxygen Saturation % 89 %    ? 2 Minute Liters of Oxygen 0 L    ? 3 Minute Oxygen Saturation % 89 %    ? 3 Minute Liters of Oxygen 0 L    ? 4 Minute Oxygen Saturation % 93 %    ? 4 Minute Liters of Oxygen 0 L    ?  5 Minute Oxygen Saturation % 91 %    ? 5 Minute Liters of Oxygen 0 L    ? 6 Minute Oxygen Saturation % 92 %    ? 6 Minute Liters of Oxygen 0 L    ? 2 Minute Post Oxygen Saturation % 93 %    ? 2 Minute Pos

## 2022-01-06 NOTE — Progress Notes (Signed)
Paula Ward 86 y.o. female ?Pulmonary Rehab Orientation Note ?This patient who was referred to Pulmonary Rehab by Dr. Lavonda Jumbo with the diagnosis of ILD arrived today in Cardiac and Pulmonary Rehab. She  arrived with ambulatory normal gait. She  does not carry portable oxygen. [employer] is the provider for their DME. Per pt, Paula Ward uses oxygen at night 2L. Color good, skin warm and dry. Patient is oriented to time and place. Patient's medical history, psychosocial health, and medications reviewed. Psychosocial assessment reveals pt lives with spouse. Paula Ward is currently retired. Pt hobbies include spending time with others, shopping, and playing cards. Pt reports her stress level is low. Areas of stress/anxiety include family . Pt does not exhibit signs of depression. PHQ2/9 score 0/1. Paula Ward shows good  coping skills with positive outlook on life. Offered emotional support and reassurance. Will continue to monitor. Physical assessment performed by Paula Small, RN. Please see their orientation physical assessment note. Paula Ward reports she does take medications as prescribed. Patient states she follows a gluten free diet. The patient has been trying to gain weight by increasing protein intake.. Pt's weight will be monitored closely. Demonstration and practice of PLB using pulse oximeter. Paula Ward able to return demonstration satisfactorily. Safety and hand hygiene in the exercise area reviewed with patient. Paula Ward voices understanding of the information reviewed. Department expectations discussed with patient and achievable goals were set. The patient shows enthusiasm about attending the program and we look forward to working with Paula Ward. Paula Ward completed a 6 min walk test today and is scheduled to begin exercise on 01/14/22 at 10:15 am.  ? ?[id number] ?Sheppard Plumber, MS, ACSM-CEP ?  ?

## 2022-01-10 ENCOUNTER — Other Ambulatory Visit: Payer: Self-pay | Admitting: Pharmacy Technician

## 2022-01-10 ENCOUNTER — Telehealth (HOSPITAL_COMMUNITY): Payer: Self-pay | Admitting: Internal Medicine

## 2022-01-14 ENCOUNTER — Other Ambulatory Visit: Payer: Self-pay

## 2022-01-14 ENCOUNTER — Other Ambulatory Visit (INDEPENDENT_AMBULATORY_CARE_PROVIDER_SITE_OTHER): Payer: Medicare PPO

## 2022-01-14 ENCOUNTER — Encounter (HOSPITAL_COMMUNITY): Payer: Medicare PPO

## 2022-01-14 DIAGNOSIS — N1831 Chronic kidney disease, stage 3a: Secondary | ICD-10-CM | POA: Diagnosis not present

## 2022-01-14 LAB — CBC WITH DIFFERENTIAL/PLATELET
Basophils Absolute: 0 10*3/uL (ref 0.0–0.1)
Basophils Relative: 0.9 % (ref 0.0–3.0)
Eosinophils Absolute: 0.1 10*3/uL (ref 0.0–0.7)
Eosinophils Relative: 1.4 % (ref 0.0–5.0)
HCT: 31.3 % — ABNORMAL LOW (ref 36.0–46.0)
Hemoglobin: 10.5 g/dL — ABNORMAL LOW (ref 12.0–15.0)
Lymphocytes Relative: 25.3 % (ref 12.0–46.0)
Lymphs Abs: 1.4 10*3/uL (ref 0.7–4.0)
MCHC: 33.5 g/dL (ref 30.0–36.0)
MCV: 108.4 fl — ABNORMAL HIGH (ref 78.0–100.0)
Monocytes Absolute: 0.4 10*3/uL (ref 0.1–1.0)
Monocytes Relative: 7.2 % (ref 3.0–12.0)
Neutro Abs: 3.5 10*3/uL (ref 1.4–7.7)
Neutrophils Relative %: 65.2 % (ref 43.0–77.0)
Platelets: 202 10*3/uL (ref 150.0–400.0)
RBC: 2.89 Mil/uL — ABNORMAL LOW (ref 3.87–5.11)
RDW: 14.5 % (ref 11.5–15.5)
WBC: 5.3 10*3/uL (ref 4.0–10.5)

## 2022-01-14 LAB — COMPREHENSIVE METABOLIC PANEL
ALT: 11 U/L (ref 0–35)
AST: 20 U/L (ref 0–37)
Albumin: 4.1 g/dL (ref 3.5–5.2)
Alkaline Phosphatase: 42 U/L (ref 39–117)
BUN: 40 mg/dL — ABNORMAL HIGH (ref 6–23)
CO2: 30 mEq/L (ref 19–32)
Calcium: 9.4 mg/dL (ref 8.4–10.5)
Chloride: 100 mEq/L (ref 96–112)
Creatinine, Ser: 1.28 mg/dL — ABNORMAL HIGH (ref 0.40–1.20)
GFR: 38.77 mL/min — ABNORMAL LOW (ref >60.00)
Glucose, Bld: 85 mg/dL (ref 70–99)
Potassium: 3.8 mEq/L (ref 3.5–5.1)
Sodium: 137 mEq/L (ref 135–145)
Total Bilirubin: 0.6 mg/dL (ref 0.2–1.2)
Total Protein: 6.4 g/dL (ref 6.0–8.3)

## 2022-01-15 ENCOUNTER — Ambulatory Visit: Payer: Medicare PPO

## 2022-01-15 NOTE — Progress Notes (Signed)
Pulmonary Individual Treatment Plan ? ?Patient Details  ?Name: Paula Ward ?MRN: [id number] ?Date of Birth: June 05, 1936 ?Referring Provider:   ?Flowsheet Row Pulmonary Rehab Walk Test from 01/06/2022 in [place]  ?Referring Provider [name]  ? ?  ? ? ?Initial Encounter Date:  ?Flowsheet Row Pulmonary Rehab Walk Test from 01/06/2022 in [place]  ?Date 01/06/22  ? ?  ? ? ?Visit Diagnosis: Interstitial lung disease ([place]) ? ?Patient's Home Medications on Admission:  ? ?Current Outpatient Medications:  ?  acetaminophen (TYLENOL) 325 MG tablet, Take 650 mg by mouth every 6 (six) hours as needed for mild pain. , Disp: , Rfl:  ?  albuterol (VENTOLIN HFA) 108 (90 Base) MCG/ACT inhaler, INHALE 2 PUFFS INTO THE LUNGS EVERY 6 HOURS AS NEEDED FOR WHEEZING OR SHORTNESS OF BREATH, Disp: 6.7 g, Rfl: 3 ?  ALPRAZolam (XANAX) 0.25 MG tablet, Take 0.5 tablets (0.125 mg total) by mouth at bedtime as needed. (Patient taking differently: Take 0.125 mg by mouth at bedtime as needed for anxiety or sleep.), Disp: 30 tablet, Rfl: 3 ?  atovaquone (MEPRON) 750 MG/5ML suspension, Take 750 mg by mouth at bedtime., Disp: , Rfl:  ?  diltiazem (CARDIZEM CD) 120 MG 24 hr capsule, Take 120 mg by mouth daily., Disp: , Rfl:  ?  fluticasone (FLONASE) 50 MCG/ACT nasal spray, INSTILL 1 SPRAY INTO EACH NOSTRIL TWICE A DAY IF NEEDED (Patient taking differently: Place 1 spray into both nostrils daily as needed for allergies.), Disp: 48 g, Rfl: 3 ?  furosemide (LASIX) 20 MG tablet, Take 1 tablet by mouth once daily, Disp: 90 tablet, Rfl: 0 ?  montelukast (SINGULAIR) 10 MG tablet, TAKE 1 TABLET BY MOUTH AT BEDTIME, Disp: 90 tablet, Rfl: 0 ?  Mouthwashes (DRY MOUTH MOUTHWASH) LIQD, Use as directed 5 mLs in the mouth or throat daily as needed (dry mouth). Biotene, Disp: , Rfl:  ?  Multiple Vitamins-Iron (MULTIVITAMIN/IRON PO), Take 1 tablet by mouth daily., Disp: , Rfl:  ?  OPSUMIT  10 MG TABS, Take 10 mg by mouth daily., Disp: , Rfl:  ?  OXYGEN, Inhale into the lungs. 2 LMP at night and upon exertion, Disp: , Rfl:  ?  pantoprazole (PROTONIX) 40 MG tablet, Take 1 tablet (40 mg total) by mouth daily., Disp: 30 tablet, Rfl: 1 ?  spironolactone (ALDACTONE) 25 MG tablet, Take 12.5 mg by mouth daily., Disp: , Rfl:  ?  sucralfate (CARAFATE) 1 GM/10ML suspension, Take ml's by mouth at bedtime, Disp: 900 mL, Rfl: 3 ?  ACCU-CHEK AVIVA PLUS test strip, , Disp: , Rfl:  ?  meclizine (ANTIVERT) 12.5 MG tablet, Take 1-2 tablets (12.5-25 mg total) by mouth 3 (three) times daily as needed for dizziness. (Patient not taking: Reported on 01/06/2022), Disp: 30 tablet, Rfl: 1 ? ?Past Medical History: ?Past Medical History:  ?Diagnosis Date  ? Anemia   ? Angiodysplasia of stomach and duodenum   ? Arthus phenomenon   ? AVM (arteriovenous malformation)   ? Blood transfusion without reported diagnosis   ? last 4 units 12-22-15, Iron infusion x2 last -01-07-16,01-14-16.  ? Breast cancer ([place]) 1989  ? Left  ? Candida esophagitis ([place])   ? Cataract   ? Chronic kidney disease   ? Chronic mild renal insuffiency  ? CREST syndrome ([place])   ? GERD (gastroesophageal reflux disease)   ? w/ HH  ? Interstitial lung disease ([place])   ? Nodule of kidney   ?  Pericardial effusion   ? PONV (postoperative nausea and vomiting)   ? Pulmonary embolus ([place]) 2003  ? Pulmonary hypertension ([place])   ? followed by Dr Gaynell Face at [facility], now Dr. Maryjean Ka visit end 2'17.  ? Rectal incontinence   ? Renal cell carcinoma ([place])   ? Scleroderma ([place])   ? Tubular adenoma of colon   ? Uterine polyp   ? ? ?Tobacco Use: ?Social History  ? ?Tobacco Use  ?Smoking Status Never  ?Smokeless Tobacco Never  ? ? ?Labs: ?Review Flowsheet   ? ?  ?  Latest Ref Rng & Units 01/30/2014 02/11/2017 06/24/2018 01/06/2020  ?Labs for ITP Cardiac and Pulmonary Rehab  ?Cholestrol 0 - 200 mg/dL 180   199   158     ?LDL (calc) 0 - 99 mg/dL 111   115   78     ?HDL-C >39.00 mg/dL 51.90    60.70   57.50     ?Trlycerides 0.0 - 149.0 mg/dL 87.0   115.0   112.0     ?Hemoglobin A1c 4.6 - 6.5 %    5.2    ?Bicarbonate 20.0 - 28.0 mmol/L      ?O2 Saturation %      ? ?  03/29/2021  ?Labs for ITP Cardiac and Pulmonary Rehab  ?Cholestrol   ?LDL (calc)   ?HDL-C   ?Trlycerides   ?Hemoglobin A1c   ?Bicarbonate 26.1    ?O2 Saturation 97.1    ?  ? ? Multiple values from one day are sorted in reverse-chronological order  ?  ?  ? ? ?Capillary Blood Glucose: ?Lab Results  ?Component Value Date  ? GLUCAP 81 12/21/2015  ? GLUCAP 91 12/21/2015  ? GLUCAP 71 12/21/2015  ? GLUCAP 85 12/21/2015  ? GLUCAP 91 12/21/2015  ? ? ? ?Pulmonary Assessment Scores: ? Pulmonary Assessment Scores   ? ? [place] Name 01/06/22 0930  ?  ?  ?  ? ADL UCSD  ? ADL Phase Entry    ? SOB Score total 11    ?  ? CAT Score  ? CAT Score 7    ?  ? mMRC Score  ? mMRC Score 1    ? ?  ?  ? ?  ? ?UCSD: ?Self-administered rating of dyspnea associated with activities of daily living (ADLs) ?6-point scale (0 = "not at all" to 5 = "maximal or unable to do because of breathlessness")  ?Scoring Scores range from 0 to 120.  Minimally important difference is 5 units ? ?CAT: ?CAT can identify the health impairment of COPD patients and is better correlated with disease progression.  ?CAT has a scoring range of zero to 40. The CAT score is classified into four groups of low (less than 10), medium (10 - 20), high (21-30) and very high (31-40) based on the impact level of disease on health status. A CAT score over 10 suggests significant symptoms.  A worsening CAT score could be explained by an exacerbation, poor medication adherence, poor inhaler technique, or progression of COPD or comorbid conditions.  ?CAT MCID is 2 points ? ?mMRC: ?mMRC (Modified Medical Research Council) Dyspnea Scale is used to assess the degree of baseline functional disability in patients of respiratory disease due to dyspnea. ?No minimal important difference is established. A decrease in score of 1  point or greater is considered a positive change.  ? ?Pulmonary Function Assessment: ? Pulmonary Function Assessment - 01/06/22 0947   ? ?  ? Breath  ?  Bilateral Breath Sounds [place]   ? Shortness of Breath Yes   ? ?  ?  ? ?  ? ? ?Exercise Target Goals: ?Exercise Program Goal: ?Individual exercise prescription set using results from initial 6 min walk test and THRR while considering  patient?s activity barriers and safety.  ? ?Exercise Prescription Goal: ?Initial exercise prescription builds to 30-45 minutes a day of aerobic activity, 2-3 days per week.  Home exercise guidelines will be given to patient during program as part of exercise prescription that the participant will acknowledge. ? ?Activity Barriers & Risk Stratification: ? Activity Barriers & Cardiac Risk Stratification - 01/06/22 0936   ? ?  ? Activity Barriers & Cardiac Risk Stratification  ? Activity Barriers Arthritis;Neck/Spine Problems;History of Falls;Balance Concerns;Muscular Weakness;Deconditioning;Shortness of Breath   ? Cardiac Risk Stratification Moderate   ? ?  ?  ? ?  ? ? ?6 Minute Walk: ? 6 Minute Walk   ? ? [place] Name 01/06/22 1208  ?  ?  ?  ? 6 Minute Walk  ? Phase Initial    ? Distance 1230 feet    ? Walk Time 6 minutes    ? # of Rest Breaks 0    ? MPH 2.39    ? METS 2.57    ? RPE 9.5    ? Perceived Dyspnea  0.5    ? VO2 Peak 8.98    ? Symptoms No    ? Resting HR 71 bpm    ? Resting BP 100/58    ? Resting Oxygen Saturation  99 %    ? Exercise Oxygen Saturation  during 6 min walk 89 %    ? Max Ex. HR 118 bpm    ? Max Ex. BP 116/60    ? 2 Minute Post BP 108/50    ?  ? Interval HR  ? 1 Minute HR 111    ? 2 Minute HR 111    ? 3 Minute HR 111    ? 4 Minute HR 113    ? 5 Minute HR 112    ? 6 Minute HR 118    ? 2 Minute Post HR 101    ? Interval Heart Rate? Yes    ?  ? Interval Oxygen  ? Interval Oxygen? Yes    ? Baseline Oxygen Saturation % 99 %    ? 1 Minute Oxygen Saturation % 94 %    ? 1 Minute Liters of Oxygen 0 L    ? 2 Minute Oxygen  Saturation % 89 %    ? 2 Minute Liters of Oxygen 0 L    ? 3 Minute Oxygen Saturation % 89 %    ? 3 Minute Liters of Oxygen 0 L    ? 4 Minute Oxygen Saturation % 93 %    ? 4 Minute Liters of Oxygen 0 L    ? 5

## 2022-01-16 ENCOUNTER — Encounter (HOSPITAL_COMMUNITY)
Admission: RE | Admit: 2022-01-16 | Discharge: 2022-01-16 | Disposition: A | Discharge status: Home / Self Care | Payer: Medicare PPO | Source: Ambulatory Visit | Attending: Pulmonary Disease | Admitting: Pulmonary Disease

## 2022-01-16 DIAGNOSIS — J849 Interstitial pulmonary disease, unspecified: Secondary | ICD-10-CM

## 2022-01-16 NOTE — Progress Notes (Signed)
Daily Session Note ? ?Patient Details  ?Name: Paula Ward ?MRN: [id number] ?Date of Birth: 1935/11/03 ?Referring Provider:   ?Flowsheet Row Pulmonary Rehab Walk Test from 01/06/2022 in [place]  ?Referring Provider [name]  ? ?  ? ? ?Encounter Date: 01/16/2022 ? ?Check In: ? Session Check In - 01/16/22 1211   ? ?  ? Check-In  ? Supervising physician immediately available to respond to emergencies [facility] immediately available   ? Physician(s) Maryland Pink   ? Location [facility]   ? Staff Present Maurice Small, RN, Quentin Ore, MS, ACSM-CEP, Exercise Physiologist;Duel Conrad Ysidro Evert, RN   ? Virtual Visit No   ? Medication changes reported     No   ? Fall or balance concerns reported    No   ? Tobacco Cessation No Change   ? Warm-up and Cool-down Performed as group-led instruction   ? Resistance Training Performed No   ? VAD Patient? No   ? PAD/SET Patient? No   ?  ? Pain Assessment  ? Currently in Pain? No/denies   ? Pain Score 0-No pain   ? Multiple Pain Sites No   ? ?  ?  ? ?  ? ? ?Capillary Blood Glucose: ?No results found for this or any previous visit (from the past 24 hour(s)). ? ? ? ?Social History  ? ?Tobacco Use  ?Smoking Status Never  ?Smokeless Tobacco Never  ? ? ?Goals Met:  ?Exercise tolerated well ?No report of concerns or symptoms today ?Strength training completed today ? ?Goals Unmet:  ?Not Applicable ? ?Comments: Service time is from 1022 to 1140 ? ? ? ?Dr. Rodman Pickle is Medical Director for Pulmonary Rehab at [facility].  ?

## 2022-01-17 ENCOUNTER — Ambulatory Visit (HOSPITAL_COMMUNITY)
Admission: RE | Admit: 2022-01-17 | Discharge: 2022-01-17 | Disposition: A | Discharge status: Home / Self Care | Payer: Medicare PPO | Source: Ambulatory Visit | Attending: Nephrology | Admitting: Nephrology

## 2022-01-17 VITALS — BP 126/45 | HR 82 | Temp 97.5°F | Resp 18

## 2022-01-17 DIAGNOSIS — N1832 Chronic kidney disease, stage 3b: Secondary | ICD-10-CM | POA: Diagnosis not present

## 2022-01-17 DIAGNOSIS — D638 Anemia in other chronic diseases classified elsewhere: Secondary | ICD-10-CM | POA: Diagnosis not present

## 2022-01-17 LAB — POCT HEMOGLOBIN-HEMACUE: Hemoglobin: 11 g/dL — ABNORMAL LOW (ref 12.0–15.0)

## 2022-01-17 MED ORDER — EPOETIN ALFA-EPBX 10000 UNIT/ML IJ SOLN
INTRAMUSCULAR | Status: AC
  Filled 2022-01-17: qty 1

## 2022-01-17 MED ORDER — EPOETIN ALFA-EPBX 10000 UNIT/ML IJ SOLN
10000.0000 [IU] | INTRAMUSCULAR | Status: DC
  Administered 2022-01-17: 10000 [IU] via SUBCUTANEOUS

## 2022-01-21 ENCOUNTER — Encounter (HOSPITAL_COMMUNITY)
Admission: RE | Admit: 2022-01-21 | Discharge: 2022-01-21 | Disposition: A | Discharge status: Home / Self Care | Payer: Medicare PPO | Source: Ambulatory Visit | Attending: Pulmonary Disease | Admitting: Pulmonary Disease

## 2022-01-21 VITALS — Wt 106.0 lb

## 2022-01-21 DIAGNOSIS — J849 Interstitial pulmonary disease, unspecified: Secondary | ICD-10-CM

## 2022-01-21 DIAGNOSIS — I272 Pulmonary hypertension, unspecified: Secondary | ICD-10-CM | POA: Insufficient documentation

## 2022-01-21 DIAGNOSIS — J8410 Pulmonary fibrosis, unspecified: Secondary | ICD-10-CM | POA: Insufficient documentation

## 2022-01-21 NOTE — Progress Notes (Signed)
Daily Session Note ? ?Patient Details  ?Name: Paula Ward ?MRN: [id number] ?Date of Birth: 09/11/1936 ?Referring Provider:   ?Flowsheet Row Pulmonary Rehab Walk Test from 01/06/2022 in [place]  ?Referring Provider [name]  ? ?  ? ? ?Encounter Date: 01/21/2022 ? ?Check In: ? Session Check In - 01/21/22 1150   ? ?  ? Check-In  ? Supervising physician immediately available to respond to emergencies [facility] immediately available   ? Physician(s) [place] Pink   ? [facility]   ? Staff Present Maurice Small, RN, Quentin Ore, MS, ACSM-CEP, Exercise Physiologist;Emoni Yang Ysidro Evert, RN   ? Virtual Visit No   ? Medication changes reported     No   ? Fall or balance concerns reported    No   ? Tobacco Cessation No Change   ? Warm-up and Cool-down Performed as group-led instruction   ? Resistance Training Performed No   ? VAD Patient? No   ?  ? Pain Assessment  ? Currently in Pain? No/denies   ? Pain Score 0-No pain   ? Multiple Pain Sites No   ? ?  ?  ? ?  ? ? ?Capillary Blood Glucose: ?No results found for this or any previous visit (from the past 24 hour(s)). ? ? Exercise Prescription Changes - 01/21/22 1100   ? ?  ? Response to Exercise  ? Blood Pressure (Admit) 134/60   ? Blood Pressure (Exercise) 130/50   ? Blood Pressure (Exit) 118/52   ? Heart Rate (Admit) 83 bpm   ? Heart Rate (Exercise) 104 bpm   ? Heart Rate (Exit) 86 bpm   ? Oxygen Saturation (Admit) 100 %   ? Oxygen Saturation (Exercise) 95 %   ? Oxygen Saturation (Exit) 100 %   ? Rating of Perceived Exertion (Exercise) 11   ? Perceived Dyspnea (Exercise) 0   ? Duration Progress to 30 minutes of  aerobic without signs/symptoms of physical distress   ? Intensity Other (comment)   40-80% of HRR  ?  ? Progression  ? Progression Continue to progress workloads to maintain intensity without signs/symptoms of physical distress.   ?  ? Resistance Training  ? Training Prescription Yes   ?  Weight Red bands   ? Reps 10-15   ? Time 10 Minutes   ?  ? Recumbant Bike  ? Level 2   ? Minutes 15   ? METs 1.5   ?  ? Track  ? Laps 16   ? Minutes 15   ? ?  ?  ? ?  ? ? ?Social History  ? ?Tobacco Use  ?Smoking Status Never  ?Smokeless Tobacco Never  ? ? ?Goals Met:  ?Exercise tolerated well ?No report of concerns or symptoms today ?Strength training completed today ? ?Goals Unmet:  ?Not Applicable ? ?Comments: Service time is from 1016 to 1142 ? ? ? ?Dr. Rodman Pickle is Medical Director for Pulmonary Rehab at [facility].  ?

## 2022-01-22 DIAGNOSIS — H401431 Capsular glaucoma with pseudoexfoliation of lens, bilateral, mild stage: Secondary | ICD-10-CM | POA: Diagnosis not present

## 2022-01-23 ENCOUNTER — Encounter (HOSPITAL_COMMUNITY)
Admission: RE | Admit: 2022-01-23 | Discharge: 2022-01-23 | Disposition: A | Discharge status: Home / Self Care | Payer: Medicare PPO | Source: Ambulatory Visit | Attending: Pulmonary Disease | Admitting: Pulmonary Disease

## 2022-01-23 DIAGNOSIS — I272 Pulmonary hypertension, unspecified: Secondary | ICD-10-CM | POA: Diagnosis not present

## 2022-01-23 DIAGNOSIS — J849 Interstitial pulmonary disease, unspecified: Secondary | ICD-10-CM

## 2022-01-23 DIAGNOSIS — J8410 Pulmonary fibrosis, unspecified: Secondary | ICD-10-CM | POA: Diagnosis not present

## 2022-01-23 NOTE — Progress Notes (Signed)
Daily Session Note ? ?Patient Details  ?Name: Paula Ward ?MRN: [id number] ?Date of Birth: 1936/01/02 ?Referring Provider:   ?Flowsheet Row Pulmonary Rehab Walk Test from 01/06/2022 in [place]  ?Referring Provider [name]  ? ?  ? ? ?Encounter Date: 01/23/2022 ? ?Check In: ? Session Check In - 01/23/22 1123   ? ?  ? Check-In  ? Supervising physician immediately available to respond to emergencies [facility] immediately available   ? Physician(s) Dr. Tawanna Solo   ? Location [facility]   ? Staff Present Rosebud Poles, RN, Quentin Ore, MS, ACSM-CEP, Exercise Physiologist;Lisa Ysidro Evert, RN   ? Virtual Visit No   ? Medication changes reported     No   ? Fall or balance concerns reported    No   ? Tobacco Cessation No Change   ? Warm-up and Cool-down Performed as group-led instruction   ? Resistance Training Performed Yes   ? VAD Patient? No   ? PAD/SET Patient? No   ?  ? Pain Assessment  ? Currently in Pain? No/denies   ? Multiple Pain Sites No   ? ?  ?  ? ?  ? ? ?Capillary Blood Glucose: ?No results found for this or any previous visit (from the past 24 hour(s)). ? ? ? ?Social History  ? ?Tobacco Use  ?Smoking Status Never  ?Smokeless Tobacco Never  ? ? ?Goals Met:  ?Proper associated with RPD/PD & O2 Sat ?Exercise tolerated well ?No report of concerns or symptoms today ?Strength training completed today ? ?Goals Unmet:  ?Not Applicable ? ?Comments: Service time is from 1020 to 1130.  ? ? ?Dr. Rodman Pickle is Medical Director for Pulmonary Rehab at [facility].  ?

## 2022-01-23 NOTE — Progress Notes (Signed)
Paula Ward 86 y.o. female ? ?Nutrition Note ?Paula Ward is motivated to make lifestyle changes to aid with pulmonary rehab. Patient has medical history of HTN, pulmonary HTN, interstitial lung disease, GERD, CKD3, renal carcinoma, chronic anemia. She lives at home with her husband. They do eat out frequently. She does the majority of the grocery shopping and cooking. She is receiving iron injections, iron infusions, and MVI with 51m iron; she reports struggling with anemia for a very long time. She does report good appetite.  ? ?Protein needs: 63-78g/kg ideal body weight ? ?24 hour Food Recall:  ?Breakfast: yogurt, cheerios, fruit, coffee ?Lunch: fast food or out to eat ?Dinner: out to eat OR ribeye steak  ?Snacks: infrequent- crackers or milkshake ? ?Nutrition Diagnosis ?Increased energy expenditure related to increased energy requirements as evidenced by BMI <20 and recent h/o wt loss. ?Excessive mineral intake (sodium) related to overconsumption of highly processed and restaurant foods as evidenced by diet recall and diagnosis of HTN ? ?Nutrition Intervention ?Pt?s individual nutrition plan reviewed with pt. ?Benefits of adopting Heart Healthy diet discussed.  ?Continue client-centered nutrition education by RD, as part of interdisciplinary care. ? ?Monitor/Evaluation: ?Patient reports motivation to make lifestyle changes for adherence to heart healthy diet recommendation and weight gain/maintenance. We discussed other concerns of muscle loss and chronic anemia. Encouraged patient to increase eating frequency or add 1-2 snacks per day to aid with weight gain/maintenance. Discussed protein drinks and snack ideas (cottage cheese, nuts, protein drink, fruit, etc). Reviewed the importance of protein intake at meals and snacks. Handouts/notes given. Patient amicable to RD suggestions and verbalizes understanding. Will follow-up as needed.  ? ?Goal(s) ?Pt to identify and limit food sources of saturated fat, trans  fat, refined carbohydrates and sodium ?Pt to describe the benefit of including lean protein/plant proteins, fruits, vegetables, whole grains, nuts/seeds, and low-fat dairy products in a heart healthy meal plan. ?Patient to identify food quantities and eating frequency to support weight gain/weight maintenance of healthy BMI. May implement 1-2 snacks per day such as premier protein shake, cottage cheese, nuts, etc. Encouraged to prioritize protein intake.  ? ?Plan:  ?Pt to attend nutrition classes  ?Will provide client-centered nutrition education as part of interdisciplinary care ?Monitor and evaluate progress toward nutrition goal with team. ? ? ?SAldona BarSMadagascar MS, RDN, LDN ? ?

## 2022-01-24 ENCOUNTER — Ambulatory Visit: Payer: Medicare PPO

## 2022-01-27 ENCOUNTER — Ambulatory Visit: Payer: Medicare PPO

## 2022-01-28 ENCOUNTER — Encounter (HOSPITAL_COMMUNITY)
Admission: RE | Admit: 2022-01-28 | Discharge: 2022-01-28 | Disposition: A | Discharge status: Home / Self Care | Payer: Medicare PPO | Source: Ambulatory Visit | Attending: Pulmonary Disease | Admitting: Pulmonary Disease

## 2022-01-28 DIAGNOSIS — I272 Pulmonary hypertension, unspecified: Secondary | ICD-10-CM | POA: Diagnosis not present

## 2022-01-28 DIAGNOSIS — J8410 Pulmonary fibrosis, unspecified: Secondary | ICD-10-CM | POA: Diagnosis not present

## 2022-01-28 DIAGNOSIS — J849 Interstitial pulmonary disease, unspecified: Secondary | ICD-10-CM

## 2022-01-28 NOTE — Progress Notes (Signed)
Daily Session Note ? ?Patient Details  ?Name: Paula Ward ?MRN: [id number] ?Date of Birth: 04/02/1936 ?Referring Provider:   ?Flowsheet Row Pulmonary Rehab Walk Test from 01/06/2022 in [place]  ?Referring Provider [name]  ? ?  ? ? ?Encounter Date: 01/28/2022 ? ?Check In: ? Session Check In - 01/28/22 1134   ? ?  ? Check-In  ? Supervising physician immediately available to respond to emergencies [facility] immediately available   ? Physician(s) Dr. Maryland Pink   ? Location [facility]   ? Staff Present Elmon Else, MS, ACSM-CEP, Exercise Physiologist;Annedrea Rosezella [place], RN, Ramonita Lab, RN   ? Virtual Visit No   ? Medication changes reported     No   ? Fall or balance concerns reported    No   ? Tobacco Cessation No Change   ? Warm-up and Cool-down Performed as group-led instruction   ? Resistance Training Performed Yes   ? VAD Patient? No   ? PAD/SET Patient? No   ?  ? Pain Assessment  ? Currently in Pain? No/denies   ? Multiple Pain Sites No   ? ?  ?  ? ?  ? ? ?Capillary Blood Glucose: ?No results found for this or any previous visit (from the past 24 hour(s)). ? ? ? ?Social History  ? ?Tobacco Use  ?Smoking Status Never  ?Smokeless Tobacco Never  ? ? ?Goals Met:  ?Proper associated with RPD/PD & O2 Sat ?Exercise tolerated well ?No report of concerns or symptoms today ?Strength training completed today ? ?Goals Unmet:  ?Not Applicable ? ?Comments: Service time is from 1015 to 1142.  ? ? ?Dr. Rodman Pickle is Medical Director for Pulmonary Rehab at [facility].  ?

## 2022-01-29 DIAGNOSIS — H401421 Capsular glaucoma with pseudoexfoliation of lens, left eye, mild stage: Secondary | ICD-10-CM | POA: Diagnosis not present

## 2022-01-30 ENCOUNTER — Encounter (HOSPITAL_COMMUNITY)
Admission: RE | Admit: 2022-01-30 | Discharge: 2022-01-30 | Disposition: A | Discharge status: Home / Self Care | Payer: Medicare PPO | Source: Ambulatory Visit | Attending: Pulmonary Disease | Admitting: Pulmonary Disease

## 2022-01-30 DIAGNOSIS — J8410 Pulmonary fibrosis, unspecified: Secondary | ICD-10-CM | POA: Diagnosis not present

## 2022-01-30 DIAGNOSIS — I272 Pulmonary hypertension, unspecified: Secondary | ICD-10-CM | POA: Diagnosis not present

## 2022-01-30 DIAGNOSIS — J849 Interstitial pulmonary disease, unspecified: Secondary | ICD-10-CM

## 2022-01-30 NOTE — Progress Notes (Signed)
Daily Session Note ? ?Patient Details  ?Name: Paula Ward ?MRN: [id number] ?Date of Birth: 24-Apr-1936 ?Referring Provider:   ?Flowsheet Row Pulmonary Rehab Walk Test from 01/06/2022 in [place]  ?Referring Provider [name]  ? ?  ? ? ?Encounter Date: 01/30/2022 ? ?Check In: ? Session Check In - 01/30/22 1117   ? ?  ? Check-In  ? Supervising physician immediately available to respond to emergencies [facility] immediately available   ? Physician(s) Dr. Maryland Pink   ? Location [facility]   ? Staff Present Rosebud Poles, RN, BSN;Jetta Walker BS, ACSM EP-C, Exercise Physiologist;Carlette Wilber Oliphant, RN, Quentin Ore, MS, ACSM-CEP, Exercise Physiologist   ? Virtual Visit No   ? Medication changes reported     No   ? Fall or balance concerns reported    No   ? Tobacco Cessation No Change   ? Warm-up and Cool-down Performed as group-led instruction   ? Resistance Training Performed Yes   ? VAD Patient? No   ? PAD/SET Patient? No   ?  ? Pain Assessment  ? Currently in Pain? No/denies   ? Multiple Pain Sites No   ? ?  ?  ? ?  ? ? ?Capillary Blood Glucose: ?No results found for this or any previous visit (from the past 24 hour(s)). ? ? ? ?Social History  ? ?Tobacco Use  ?Smoking Status Never  ?Smokeless Tobacco Never  ? ? ?Goals Met:  ?Proper associated with RPD/PD & O2 Sat ?Exercise tolerated well ?No report of concerns or symptoms today ?Strength training completed today ? ?Goals Unmet:  ?Not Applicable ? ?Comments: Service time is from 1012 to 1140 ? ? ? ?Dr. Rodman Pickle is Medical Director for Pulmonary Rehab at [facility]  ?

## 2022-01-31 ENCOUNTER — Ambulatory Visit (HOSPITAL_COMMUNITY)
Admission: RE | Admit: 2022-01-31 | Discharge: 2022-01-31 | Disposition: A | Discharge status: Home / Self Care | Payer: Medicare PPO | Source: Ambulatory Visit | Attending: Nephrology | Admitting: Nephrology

## 2022-01-31 VITALS — BP 134/39 | HR 80 | Temp 97.4°F | Resp 20

## 2022-01-31 DIAGNOSIS — D638 Anemia in other chronic diseases classified elsewhere: Secondary | ICD-10-CM | POA: Insufficient documentation

## 2022-01-31 DIAGNOSIS — N1832 Chronic kidney disease, stage 3b: Secondary | ICD-10-CM

## 2022-01-31 LAB — FERRITIN: Ferritin: 420 ng/mL — ABNORMAL HIGH (ref 11–307)

## 2022-01-31 LAB — IRON AND TIBC
Iron: 93 ug/dL (ref 28–170)
Saturation Ratios: 37 % — ABNORMAL HIGH (ref 10.4–31.8)
TIBC: 253 ug/dL (ref 250–450)
UIBC: 160 ug/dL

## 2022-01-31 LAB — POCT HEMOGLOBIN-HEMACUE: Hemoglobin: 12 g/dL (ref 12.0–15.0)

## 2022-01-31 MED ORDER — EPOETIN ALFA-EPBX 10000 UNIT/ML IJ SOLN: 10000.0000 [IU] | INTRAMUSCULAR | Status: DC

## 2022-02-03 ENCOUNTER — Encounter: Payer: Self-pay | Admitting: Pulmonary Disease

## 2022-02-03 ENCOUNTER — Ambulatory Visit: Payer: Medicare PPO | Admitting: Pulmonary Disease

## 2022-02-03 ENCOUNTER — Ambulatory Visit (INDEPENDENT_AMBULATORY_CARE_PROVIDER_SITE_OTHER): Payer: Medicare PPO | Admitting: *Deleted

## 2022-02-03 ENCOUNTER — Ambulatory Visit (INDEPENDENT_AMBULATORY_CARE_PROVIDER_SITE_OTHER): Payer: Medicare PPO | Admitting: Pulmonary Disease

## 2022-02-03 VITALS — BP 120/60 | HR 77 | Ht 63.0 in | Wt 105.0 lb

## 2022-02-03 VITALS — BP 133/66 | HR 65 | Temp 97.8°F | Resp 16 | Ht 63.0 in | Wt 104.8 lb

## 2022-02-03 DIAGNOSIS — J849 Interstitial pulmonary disease, unspecified: Secondary | ICD-10-CM | POA: Diagnosis not present

## 2022-02-03 DIAGNOSIS — I272 Pulmonary hypertension, unspecified: Secondary | ICD-10-CM

## 2022-02-03 DIAGNOSIS — M349 Systemic sclerosis, unspecified: Secondary | ICD-10-CM | POA: Diagnosis not present

## 2022-02-03 DIAGNOSIS — N183 Chronic kidney disease, stage 3 unspecified: Secondary | ICD-10-CM | POA: Diagnosis not present

## 2022-02-03 DIAGNOSIS — D500 Iron deficiency anemia secondary to blood loss (chronic): Secondary | ICD-10-CM

## 2022-02-03 LAB — PULMONARY FUNCTION TEST
DL/VA % pred: 119 %
DL/VA: 4.88 ml/min/mmHg/L
DLCO cor % pred: 63 %
DLCO cor: 11.49 ml/min/mmHg
DLCO unc % pred: 57 %
DLCO unc: 10.32 ml/min/mmHg
FEF 25-75 Pre: 1.21 L/sec
FEF2575-%Pred-Pre: 100 %
FEV1-%Pred-Pre: 64 %
FEV1-Pre: 1.12 L
FEV1FVC-%Pred-Pre: 114 %
FEV6-%Pred-Pre: 60 %
FEV6-Pre: 1.34 L
FEV6FVC-%Pred-Pre: 106 %
FVC-%Pred-Pre: 56 %
FVC-Pre: 1.34 L
Pre FEV1/FVC ratio: 84 %
Pre FEV6/FVC Ratio: 100 %
RV % pred: 62 %
RV: 1.5 L
TLC % pred: 58 %
TLC: 2.88 L

## 2022-02-03 MED ORDER — SODIUM CHLORIDE 0.9 % IV SOLN
510.0000 mg | Freq: Once | INTRAVENOUS | Status: AC
  Administered 2022-02-03: 510 mg via INTRAVENOUS
  Filled 2022-02-03: qty 17

## 2022-02-03 NOTE — Progress Notes (Signed)
Diagnosis: Iron Deficiency Anemia ? ?Provider:  Marshell Garfinkel, MD ? ?Procedure: Infusion ? ?IV Type: Peripheral, IV Location: L Antecubital ? ?Feraheme (Ferumoxytol), Dose: 510 mg ? ?Infusion Start Time: 1314 pm ? ?Infusion Stop Time: 1335 pm ? ?Post Infusion IV Care: Observation period completed and Peripheral IV Discontinued ? ?Discharge: Condition: Good, Destination: Home . AVS provided to patient.  ? ?Performed by:  Oren Beckmann, RN  ?  ?

## 2022-02-03 NOTE — Progress Notes (Signed)
? ?Synopsis: Referred in November 2022 for ILD by Harl Bowie, MD ? ?Subjective:  ? ?PATIENT ID: Paula Ward GENDER: female DOB: 06/10/1936, MRN: [id number] ? ?HPI ? ?Chief Complaint  ?Patient presents with  ? Follow-up  ?  F/U after PFT.   ? ?Paula Ward is an 86 year old woman, never smoker with history of pulmonary hypertension, AVMs of small bowel, chronic blood loss anemia, CKD, scleroderma who returns to pulmonary clinic for ILD.  ? ?She has been doing well since last visit. She reports feeling better after an increase in her hemoglobin from 9 to 12g/dL with epo injections. She is also receiving iron infusions.  ? ?She has followed up at [facility] [place] in June.  ? ?PFTs today show moderate restriction and moderate diffusion defect.  ? ?OV 08/28/21 ?She has been followed by Dr. Lavonda Jumbo for ILD and Dr. Christa See for pulmonary hypertension related to her scleroderma at the [facility] pulmonary clinic.  She has been referred to our pulmonary clinic to establish care as it is getting more difficult for her to travel to [place].  At this time she would like to continue care at [facility] but also be established with our clinic in case of an emergency. ? ?She reports she has progressive shortness of breath especially with exertion.  She was previously on immunosuppression for treatment of her interstitial lung disease related to scleroderma but she did not tolerate this treatment which was discontinued due to side effects.  Antifibrotic therapy was discussed on her previous visits but has not been initiated due to the side effect profile.  She remains on macitentan for her pulmonary hypertension. ? ?She was previously on supplemental oxygen therapy but based on her 6-minute walk tests at [facility] she was able to be weaned off oxygen at rest and with exertion. ? ?She denies any chest pain, syncopal episodes or lower extremity edema. ? ?She is a never smoker.  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  She lives at home with her husband. ? ?Past Medical History:  ?Diagnosis Date  ? Anemia   ? Angiodysplasia of stomach and duodenum   ? Arthus phenomenon   ? AVM (arteriovenous malformation)   ? Blood transfusion without reported diagnosis   ? last 4 units 12-22-15, Iron infusion x2 last -01-07-16,01-14-16.  ? Breast cancer ([place]) 1989  ? Left  ? Candida esophagitis ([place])   ? Cataract   ? Chronic kidney disease   ? Chronic mild renal insuffiency  ? CREST syndrome ([place])   ? GERD (gastroesophageal reflux disease)   ? w/ HH  ? Interstitial lung disease ([place])   ? Nodule of kidney   ? Pericardial effusion   ? PONV (postoperative nausea and vomiting)   ? Pulmonary embolus ([place]) 2003  ? Pulmonary hypertension ([place])   ? followed by Dr Gaynell Face at [facility], now Dr. Maryjean Ka visit end 2'17.  ? Rectal incontinence   ? Renal cell carcinoma ([place])   ? Scleroderma ([place])   ? Tubular adenoma of colon   ? Uterine polyp   ?  ? ?Family History  ?Problem Relation Age of Onset  ? Bladder Cancer Father   ? Diabetes Father   ? Prostate cancer Father   ? Alzheimer's disease Mother   ? Diabetes Sister   ? Lung cancer Sister   ?      smoker  ? Esophageal cancer Paternal Uncle   ?  Colon cancer Neg Hx   ?  ? ?Social History  ? ?Socioeconomic History  ? Marital status: Married  ?  Spouse name: Not on file  ? Number of children: 2  ? Years of education: Not on file  ? Highest education level: Not on file  ?Occupational History  ? Occupation: Retired  ?Tobacco Use  ? Smoking status: Never  ? Smokeless tobacco: Never  ?Vaping Use  ? Vaping Use: Never used  ?Substance and Sexual Activity  ? Alcohol use: No  ? Drug use: No  ? Sexual activity: Not Currently  ?Other Topics Concern  ? Not on file  ?Social History Narrative  ? Married '611 son- '65, 1 daughter '63; 6 children (2 adopted)SO- SOBRetirement- doing well. Marriage in good health. Patient has never smoked. Alcohol use- noPt gets regular  exercise  ? ?Social Determinants of Health  ? ?Financial Resource Strain: Not on file  ?Food Insecurity: Not on file  ?Transportation Needs: Not on file  ?Physical Activity: Not on file  ?Stress: Not on file  ?Social Connections: Not on file  ?Intimate Partner Violence: Not on file  ?  ? ?Allergies  ?Allergen Reactions  ? Codeine Nausea Only  ?  Hallucinations, too  ? Other Nausea And Vomiting  ?  "-mycin" antibiotics.   Also cause hallucinations.  ? Erythromycin Nausea And Vomiting  ? Lisinopril   ?  Pt doesn't remember   ? Mycophenolate Mofetil Nausea Only  ? Sulfa Antibiotics Other (See Comments)  ?  unknown  ?  ? ?Outpatient Medications Prior to Visit  ?Medication Sig Dispense Refill  ? acetaminophen (TYLENOL) 325 MG tablet Take 650 mg by mouth every 6 (six) hours as needed for mild pain.     ? albuterol (VENTOLIN HFA) 108 (90 Base) MCG/ACT inhaler INHALE 2 PUFFS INTO THE LUNGS EVERY 6 HOURS AS NEEDED FOR WHEEZING OR SHORTNESS OF BREATH 6.7 g 3  ? ALPRAZolam (XANAX) 0.25 MG tablet Take 0.5 tablets (0.125 mg total) by mouth at bedtime as needed. (Patient taking differently: Take 0.125 mg by mouth at bedtime as needed for anxiety or sleep.) 30 tablet 3  ? atovaquone (MEPRON) 750 MG/5ML suspension Take 750 mg by mouth at bedtime.    ? diltiazem (CARDIZEM CD) 120 MG 24 hr capsule Take 120 mg by mouth daily.    ? fluticasone (FLONASE) 50 MCG/ACT nasal spray INSTILL 1 SPRAY INTO EACH NOSTRIL TWICE A DAY IF NEEDED (Patient taking differently: Place 1 spray into both nostrils daily as needed for allergies.) 48 g 3  ? furosemide (LASIX) 20 MG tablet Take 1 tablet by mouth once daily 90 tablet 0  ? montelukast (SINGULAIR) 10 MG tablet TAKE 1 TABLET BY MOUTH AT BEDTIME 90 tablet 0  ? Mouthwashes (DRY MOUTH MOUTHWASH) LIQD Use as directed 5 mLs in the mouth or throat daily as needed (dry mouth). Biotene    ? Multiple Vitamins-Iron (MULTIVITAMIN/IRON PO) Take 1 tablet by mouth daily.    ? OPSUMIT 10 MG TABS Take 10 mg by  mouth daily.    ? OXYGEN Inhale into the lungs. 2 LMP at night and upon exertion    ? pantoprazole (PROTONIX) 40 MG tablet Take 1 tablet (40 mg total) by mouth daily. 30 tablet 1  ? spironolactone (ALDACTONE) 25 MG tablet Take 12.5 mg by mouth daily.    ? sucralfate (CARAFATE) 1 GM/10ML suspension Take ml's by mouth at bedtime 900 mL 3  ? ACCU-CHEK AVIVA PLUS test strip  (  Patient not taking: Reported on 01/06/2022)    ? meclizine (ANTIVERT) 12.5 MG tablet Take 1-2 tablets (12.5-25 mg total) by mouth 3 (three) times daily as needed for dizziness. (Patient not taking: Reported on 01/06/2022) 30 tablet 1  ? ?Facility-Administered Medications Prior to Visit  ?Medication Dose Route Frequency Provider Last Rate Last Admin  ? ferumoxytol (FERAHEME) 510 mg in sodium chloride 0.9 % 100 mL IVPB  510 mg Intravenous Once Tresa Res, RPH      ? ? ?Review of Systems  ?Constitutional:  Negative for chills, fever, malaise/fatigue and weight loss.  ?HENT:  Negative for congestion, sinus pain and sore throat.   ?Eyes: Negative.   ?Respiratory:  Positive for shortness of breath. Negative for cough, hemoptysis, sputum production and wheezing.   ?Cardiovascular:  Negative for chest pain, palpitations, orthopnea, claudication and leg swelling.  ?Gastrointestinal:  Negative for abdominal pain, heartburn, nausea and vomiting.  ?Genitourinary: Negative.   ?Musculoskeletal:  Negative for joint pain and myalgias.  ?Skin:  Negative for rash.  ?Neurological:  Negative for weakness.  ?Endo/Heme/Allergies: Negative.   ?Psychiatric/Behavioral: Negative.    ? ?Objective:  ? ?Vitals:  ? 02/03/22 1203  ?BP: 120/60  ?Pulse: 77  ?SpO2: 97%  ?Weight: 105 lb (47.6 kg)  ?Height: _0  (1.6 m)  ? ? ? ?Physical Exam ?Constitutional:   ?   General: She is not in acute distress. ?   Appearance: She is not ill-appearing.  ?HENT:  ?   Head: Normocephalic and atraumatic.  ?Eyes:  ?   General: No scleral icterus. ?   Conjunctiva/sclera: Conjunctivae normal.   ?Cardiovascular:  ?   Rate and Rhythm: Normal rate and regular rhythm.  ?   Pulses: Normal pulses.  ?   Heart sounds: Murmur (systolic) heard.  ?Pulmonary:  ?   Effort: Pulmonary effort is normal.  ?   Breath so

## 2022-02-03 NOTE — Progress Notes (Addendum)
Spirometry, DLCO and lung volumes performed today. ?

## 2022-02-03 NOTE — Patient Instructions (Addendum)
Continue taking opsumit 19m daily for pulmonary hypertension ? ?Your breathing tests are stable compared to the ones before.  ? ?Follow up in 7 months ?

## 2022-02-03 NOTE — Patient Instructions (Addendum)
Spirometry, DLOC and lung volumes performed today. ?

## 2022-02-04 ENCOUNTER — Encounter (HOSPITAL_COMMUNITY)
Admission: RE | Admit: 2022-02-04 | Discharge: 2022-02-04 | Disposition: A | Discharge status: Home / Self Care | Payer: Medicare PPO | Source: Ambulatory Visit | Attending: Pulmonary Disease | Admitting: Pulmonary Disease

## 2022-02-04 DIAGNOSIS — J8410 Pulmonary fibrosis, unspecified: Secondary | ICD-10-CM | POA: Diagnosis not present

## 2022-02-04 DIAGNOSIS — I272 Pulmonary hypertension, unspecified: Secondary | ICD-10-CM | POA: Diagnosis not present

## 2022-02-04 DIAGNOSIS — J849 Interstitial pulmonary disease, unspecified: Secondary | ICD-10-CM

## 2022-02-04 NOTE — Progress Notes (Signed)
Daily Session Note ? ?Patient Details  ?Name: Paula Ward ?MRN: [id number] ?Date of Birth: [birth year] ?Referring Provider:   ?Flowsheet Row Pulmonary Rehab Walk Test from 01/06/2022 in [place]  ?Referring Provider [name]  ? ?  ? ? ?Encounter Date: 02/04/2022 ? ?Check In: ? Session Check In - 02/04/22 1123   ? ?  ? Check-In  ? Supervising physician immediately available to respond to emergencies [facility] immediately available   ? Physician(s) Dr. Pietro Cassis   ? Location [facility]   ? Staff Present Rosebud Poles, RN, Luisa Hart, RN, BSN;Lisa Ysidro Evert, Cathleen Fears, MS, ACSM-CEP, Exercise Physiologist   ? Virtual Visit No   ? Medication changes reported     No   ? Fall or balance concerns reported    No   ? Tobacco Cessation No Change   ? Warm-up and Cool-down Performed as group-led instruction   ? Resistance Training Performed Yes   ? VAD Patient? No   ? PAD/SET Patient? No   ?  ? Pain Assessment  ? Currently in Pain? No/denies   ? Multiple Pain Sites No   ? ?  ?  ? ?  ? ? ?Capillary Blood Glucose: ?No results found for this or any previous visit (from the past 24 hour(s)). ? ? Exercise Prescription Changes - 02/04/22 1200   ? ?  ? Response to Exercise  ? Blood Pressure (Admit) 122/56   ? Blood Pressure (Exercise) 110/50   ? Blood Pressure (Exit) 122/62   ? Heart Rate (Admit) 81 bpm   ? Heart Rate (Exercise) 106 bpm   ? Heart Rate (Exit) 86 bpm   ? Oxygen Saturation (Admit) 98 %   ? Oxygen Saturation (Exercise) 95 %   ? Oxygen Saturation (Exit) 96 %   ? Rating of Perceived Exertion (Exercise) 11   ? Perceived Dyspnea (Exercise) 1   ? Duration Continue with 30 min of aerobic exercise without signs/symptoms of physical distress.   ? Intensity THRR unchanged   ?  ? Progression  ? Progression Continue to progress workloads to maintain intensity without signs/symptoms of physical distress.   ?  ? Resistance Training  ? Training Prescription Yes    ? Weight Red Bands   ? Reps 10-15   ? Time 10 Minutes   ?  ? Recumbant Bike  ? Level 2   ? Minutes 15   ? METs 1.8   ?  ? Track  ? Laps 22   ? Minutes 15   ? ?  ?  ? ?  ? ? ?Social History  ? ?Tobacco Use  ?Smoking Status Never  ?Smokeless Tobacco Never  ? ? ?Goals Met:  ?Proper associated with RPD/PD & O2 Sat ?Exercise tolerated well ?No report of concerns or symptoms today ?Strength training completed today ? ?Goals Unmet:  ?Not Applicable ? ?Comments: Service time is from 1018 to 1145. ? ? ? ?Dr. Rodman Pickle is Medical Director for Pulmonary Rehab at [facility].  ?

## 2022-02-06 ENCOUNTER — Encounter (HOSPITAL_COMMUNITY): Payer: Medicare PPO

## 2022-02-07 ENCOUNTER — Telehealth: Payer: Self-pay | Admitting: Gastroenterology

## 2022-02-07 ENCOUNTER — Ambulatory Visit: Payer: Medicare PPO | Admitting: Gastroenterology

## 2022-02-07 ENCOUNTER — Encounter: Payer: Self-pay | Admitting: Gastroenterology

## 2022-02-07 VITALS — BP 120/42 | HR 90 | Ht 63.0 in | Wt 105.2 lb

## 2022-02-07 DIAGNOSIS — E46 Unspecified protein-calorie malnutrition: Secondary | ICD-10-CM | POA: Diagnosis not present

## 2022-02-07 DIAGNOSIS — D508 Other iron deficiency anemias: Secondary | ICD-10-CM | POA: Diagnosis not present

## 2022-02-07 DIAGNOSIS — R159 Full incontinence of feces: Secondary | ICD-10-CM | POA: Diagnosis not present

## 2022-02-07 DIAGNOSIS — K219 Gastro-esophageal reflux disease without esophagitis: Secondary | ICD-10-CM | POA: Diagnosis not present

## 2022-02-07 MED ORDER — LANSOPRAZOLE 30 MG PO CPDR: 30.0000 mg | DELAYED_RELEASE_CAPSULE | Freq: Two times a day (BID) | ORAL | 6 refills | Status: DC

## 2022-02-07 MED ORDER — SUCRALFATE 1 G PO TABS: 1.0000 g | ORAL_TABLET | Freq: Four times a day (QID) | ORAL | 3 refills | Status: DC | PRN

## 2022-02-07 MED ORDER — SUCRALFATE 1 GM/10ML PO SUSP: ORAL | 3 refills | Status: DC

## 2022-02-07 NOTE — Progress Notes (Signed)
? ?       ? ?Paula Ward    [id number]    04-05-1936 ? ?Primary Care Physician:Crawford, Real Cons, MD ? ?Referring Physician: Hoyt Koch, MD ?[address] ?[place],  [place] [address] ? ? ?Chief complaint:  Iron deficincy anemia ? ?HPI: ? ?86 year old very pleasant female with history of scleroderma, pulmonary hypertension, large pericardial effusion, small bowel AVM with chronic anemia here for follow-up visit ?  ?She is feeling better overall, hemoglobin improved to 12 with EPO injection. ?  ?Denies any melena or rectal bleeding.  ?  ?Weight has stabilized.  She is no longer losing weight but has not been able to gain much weight.  Her appetite is normal. ?  ?She is using MiraLAX every other day and has regular bowel movements.  She has intermittent episodes of fecal incontinence when she walks or she finds stool seepage in her depends ?  ?She was hospitalized in June 2022 with severe anemia and melena ?  ?She has been off anticoagulation due to history of recurrent GI bleed ? ?Small bowel enteroscopy on June 10 and April 13, 2021: ?Multiple AVMs in the stomach and small bowel treated with APC ?  ?Colonoscopy April 15, 2021: Left colon diverticulosis otherwise unremarkable exam ?  ?She continues to have shortness of breath and cough. ?She is followed by [facility] for pulmonary hypertension and ILD.  Her oxygen saturation was 93% on 6 minute walk test, has home O2. ?She was previously on Bactrim, CellCept and prednisone.  She is currently on macitentan and diltiazem. ?She has large pericardial effusion without tamponade physiology, is monitored by cardiology at [facility] ?  ?  ?CT abdomen and pelvis 04/04/2019: ?Large amount of stool through out the colon, colon diverticulosis otherwise no acute pathology ?Large pericardial effusion, limited imaging. ?  ?CT chest UIP pattern fibrosis ?  ?Colonoscopy July 12, 2014 by Dr. Olevia Perches: Sessile polyp tubular adenoma removed from ascending colon.   Diverticulosis ?  ?  ?  ? ? ?Outpatient Encounter Medications as of 02/07/2022  ?Medication Sig  ? acetaminophen (TYLENOL) 325 MG tablet Take 650 mg by mouth every 6 (six) hours as needed for mild pain.   ? albuterol (VENTOLIN HFA) 108 (90 Base) MCG/ACT inhaler INHALE 2 PUFFS INTO THE LUNGS EVERY 6 HOURS AS NEEDED FOR WHEEZING OR SHORTNESS OF BREATH  ? ALPRAZolam (XANAX) 0.25 MG tablet Take 0.5 tablets (0.125 mg total) by mouth at bedtime as needed. (Patient taking differently: Take 0.125 mg by mouth at bedtime as needed for anxiety or sleep.)  ? diltiazem (CARDIZEM CD) 120 MG 24 hr capsule Take 120 mg by mouth daily.  ? fluticasone (FLONASE) 50 MCG/ACT nasal spray INSTILL 1 SPRAY INTO EACH NOSTRIL TWICE A DAY IF NEEDED (Patient taking differently: Place 1 spray into both nostrils daily as needed for allergies.)  ? furosemide (LASIX) 20 MG tablet Take 1 tablet by mouth once daily  ? montelukast (SINGULAIR) 10 MG tablet TAKE 1 TABLET BY MOUTH AT BEDTIME  ? Mouthwashes (DRY MOUTH MOUTHWASH) LIQD Use as directed 5 mLs in the mouth or throat daily as needed (dry mouth). Biotene  ? Multiple Vitamins-Iron (MULTIVITAMIN/IRON PO) Take 1 tablet by mouth daily.  ? OPSUMIT 10 MG TABS Take 10 mg by mouth daily.  ? OXYGEN Inhale into the lungs. 2 LMP at night and upon exertion  ? pantoprazole (PROTONIX) 40 MG tablet Take 1 tablet (40 mg total) by mouth daily. (Patient taking differently: Take 40  mg by mouth 2 (two) times daily. Once to twice a day as needed)  ? spironolactone (ALDACTONE) 25 MG tablet Take 12.5 mg by mouth daily.  ? sucralfate (CARAFATE) 1 GM/10ML suspension Take ml's by mouth at bedtime (Patient taking differently: 1 g 4 (four) times daily.)  ? [DISCONTINUED] atovaquone (MEPRON) 750 MG/5ML suspension Take 750 mg by mouth at bedtime.  ? ?No facility-administered encounter medications on file as of 02/07/2022.  ? ? ?Allergies as of 02/07/2022 - Review Complete 02/07/2022  ?Allergen Reaction Noted  ? Codeine Nausea  Only 08/03/2008  ? Other Nausea And Vomiting 04/05/2012  ? Erythromycin Nausea And Vomiting 10/17/2014  ? Lisinopril  12/14/2014  ? Mycophenolate mofetil Nausea Only 09/23/2019  ? Sulfa antibiotics Other (See Comments) 03/28/2021  ? ? ?Past Medical History:  ?Diagnosis Date  ? Anemia   ? Angiodysplasia of stomach and duodenum   ? Arthus phenomenon   ? AVM (arteriovenous malformation)   ? Blood transfusion without reported diagnosis   ? last 4 units 12-22-15, Iron infusion x2 last -01-07-16,01-14-16.  ? Breast cancer ([place]) 1989  ? Left  ? Candida esophagitis ([place])   ? Cataract   ? Chronic kidney disease   ? Chronic mild renal insuffiency  ? CREST syndrome ([place])   ? GERD (gastroesophageal reflux disease)   ? w/ HH  ? Interstitial lung disease ([place])   ? Nodule of kidney   ? Pericardial effusion   ? PONV (postoperative nausea and vomiting)   ? Pulmonary embolus ([place]) 2003  ? Pulmonary hypertension ([place])   ? followed by Dr Gaynell Face at [facility], now Dr. Maryjean Ka visit end 2'17.  ? Rectal incontinence   ? Renal cell carcinoma ([place])   ? Scleroderma ([place])   ? Tubular adenoma of colon   ? Uterine polyp   ? ? ?Past Surgical History:  ?Procedure Laterality Date  ? APPENDECTOMY  1962  ? BREAST LUMPECTOMY  1989  ? left  ? CATARACT EXTRACTION, BILATERAL Bilateral 12/2013  ? COLONOSCOPY WITH PROPOFOL N/A 04/15/2021  ? Procedure: COLONOSCOPY WITH PROPOFOL;  Surgeon: Milus Banister, MD;  Location: [facility];  Service: Endoscopy;  Laterality: N/A;  ? ENTEROSCOPY N/A 01/18/2016  ? Procedure: ENTEROSCOPY;  Surgeon: Mauri Pole, MD;  Location: [facility];  Service: Endoscopy;  Laterality: N/A;  ? ENTEROSCOPY N/A 05/24/2018  ? Procedure: ENTEROSCOPY;  Surgeon: Jackquline Denmark, MD;  Location: [facility];  Service: Endoscopy;  Laterality: N/A;  ? ENTEROSCOPY N/A 03/29/2021  ? Procedure: ENTEROSCOPY;  Surgeon: Jerene Bears, MD;  Location[facility];  Service: Gastroenterology;  Laterality: N/A;  ? ENTEROSCOPY N/A 04/13/2021  ?  Procedure: ENTEROSCOPY;  Surgeon: Yetta Flock, MD;  Location: [facility];  Service: Gastroenterology;  Laterality: N/A;  ? ESOPHAGOGASTRODUODENOSCOPY (EGD) WITH PROPOFOL N/A 12/21/2015  ? Procedure: ESOPHAGOGASTRODUODENOSCOPY (EGD) WITH PROPOFOL;  Surgeon: Irene Shipper, MD;  Location: [facility];  Service: Endoscopy;  Laterality: N/A;  ? HOT HEMOSTASIS N/A 05/24/2018  ? Procedure: HOT HEMOSTASIS (ARGON PLASMA COAGULATION/BICAP);  Surgeon: Jackquline Denmark, MD;  Location: [facility];  Service: Endoscopy;  Laterality: N/A;  ? HOT HEMOSTASIS N/A 03/29/2021  ? Procedure: HOT HEMOSTASIS (ARGON PLASMA COAGULATION/BICAP);  Surgeon: Jerene Bears, MD;  Location: [facility];  Service: Gastroenterology;  Laterality: N/A;  ? HOT HEMOSTASIS N/A 04/13/2021  ? Procedure: HOT HEMOSTASIS (ARGON PLASMA COAGULATION/BICAP);  Surgeon: Yetta Flock, MD;  Location: [facility];  Service: Gastroenterology;  Laterality: N/A;  ? IR GENERIC HISTORICAL  06/05/2016  ?  IR RADIOLOGIST EVAL & MGMT 06/05/2016 Aletta Edouard, MD [facility] INTERV RAD  ? IVC Filter    ? KIDNEY SURGERY    ? left -"laser surgery by Dr. Kathlene Cote- 5 yrs ago" no removal  ? SCHLEROTHERAPY  05/24/2018  ? Procedure: SCHLEROTHERAPY;  Surgeon: Jackquline Denmark, MD;  Location: [facility];  Service: Endoscopy;;  ? SUBMUCOSAL TATTOO INJECTION  04/13/2021  ? Procedure: SUBMUCOSAL TATTOO INJECTION;  Surgeon: Yetta Flock, MD;  Location: [facility];  Service: Gastroenterology;;  ? TONSILLECTOMY    ? TUBAL LIGATION    ? ? ?Family History  ?Problem Relation Age of Onset  ? Bladder Cancer Father   ? Diabetes Father   ? Prostate cancer Father   ? Alzheimer's disease Mother   ? Diabetes Sister   ? Lung cancer Sister   ?      smoker  ? Esophageal cancer Paternal Uncle   ? Colon cancer Neg Hx   ? ? ?Social History  ? ?Socioeconomic History  ? Marital status: Married  ?  Spouse name: Not on file  ? Number of children: 2  ? Years of education: Not on file  ? Highest  education level: Not on file  ?Occupational History  ? Occupation: Retired  ?Tobacco Use  ? Smoking status: Never  ? Smokeless tobacco: Never  ?Vaping Use  ? Vaping Use: Never used  ?Substance and Sexual Activity  ?

## 2022-02-07 NOTE — Patient Instructions (Addendum)
We have sent the following medications to your pharmacy for you to pick up at your convenience: Lansoprazole  Sucralfate   ? ?Increase calorie intake ? ?STOP Pantoprazole ? ?Follow up in 4 months ? ?Kegel Exercises ? ?Kegel exercises can help strengthen your pelvic floor muscles. The pelvic floor is a group of muscles that support your rectum, small intestine, and bladder. In females, pelvic floor muscles also help support the uterus. These muscles help you control the flow of urine and stool (feces). ?Kegel exercises are painless and simple. They do not require any equipment. Your provider may suggest Kegel exercises to: ?Improve bladder and bowel control. ?Improve sexual response. ?Improve weak pelvic floor muscles after surgery to remove the uterus (hysterectomy) or after pregnancy, in females. ?Improve weak pelvic floor muscles after prostate gland removal or surgery, in males. ?Kegel exercises involve squeezing your pelvic floor muscles. These are the same muscles you squeeze when you try to stop the flow of urine or keep from passing gas. The exercises can be done while sitting, standing, or lying down, but it is best to vary your position. ?Ask your health care provider which exercises are safe for you. Do exercises exactly as told by your health care provider and adjust them as directed. Do not begin these exercises until told by your health care provider. ?Exercises ?How to do Kegel exercises: ?Squeeze your pelvic floor muscles tight. You should feel a tight lift in your rectal area. If you are a female, you should also feel a tightness in your vaginal area. Keep your stomach, buttocks, and legs relaxed. ?Hold the muscles tight for up to 10 seconds. ?Breathe normally. ?Relax your muscles for up to 10 seconds. ?Repeat as told by your health care provider. ?Repeat this exercise daily as told by your health care provider. Continue to do this exercise for at least 4-6 weeks, or for as long as told by your  health care provider. ?You may be referred to a physical therapist who can help you learn more about how to do Kegel exercises. ?Depending on your condition, your health care provider may recommend: ?Varying how long you squeeze your muscles. ?Doing several sets of exercises every day. ?Doing exercises for several weeks. ?Making Kegel exercises a part of your regular exercise routine. ?This information is not intended to replace advice given to you by your health care provider. Make sure you discuss any questions you have with your health care provider. ?Document Revised: 02/14/2021 Document Reviewed: 02/14/2021 ?Elsevier Patient Education ? [place]. ? ? ?If you are age 84 or older, your body mass index should be between 23-30. Your Body mass index is 18.64 kg/m?Marland Kitchen If this is out of the aforementioned range listed, please consider follow up with your Primary Care Provider. ? ?If you are age 84 or younger, your body mass index should be between 19-25. Your Body mass index is 18.64 kg/m?Marland Kitchen If this is out of the aformentioned range listed, please consider follow up with your Primary Care Provider.  ? ?________________________________________________________ ? ?The [facility] providers would like to encourage you to use [facility] to communicate with providers for non-urgent requests or questions.  Due to long hold times on the telephone, sending your provider a message by [facility] may be a faster and more efficient way to get a response.  Please allow 48 business hours for a response.  Please remember that this is for non-urgent requests.  ?_______________________________________________________  ? ?Thank you for choosing [facility] ? ?Karleen Hampshire  [place],MD  ?

## 2022-02-07 NOTE — Telephone Encounter (Signed)
Tablets are fine. Sent in script for tablets pt prefers  ?

## 2022-02-07 NOTE — Telephone Encounter (Signed)
Inbound call from patient stated that Dr. Silverio Decamp proscribed her Carafate in a liquid. Patient is seeking advice if she can get a pill instead. Please advise.    ?

## 2022-02-10 ENCOUNTER — Ambulatory Visit (INDEPENDENT_AMBULATORY_CARE_PROVIDER_SITE_OTHER): Payer: Medicare PPO

## 2022-02-10 VITALS — BP 117/58 | HR 67 | Temp 97.4°F | Resp 19 | Ht 63.0 in | Wt 106.0 lb

## 2022-02-10 DIAGNOSIS — D500 Iron deficiency anemia secondary to blood loss (chronic): Secondary | ICD-10-CM | POA: Diagnosis not present

## 2022-02-10 DIAGNOSIS — N183 Chronic kidney disease, stage 3 unspecified: Secondary | ICD-10-CM

## 2022-02-10 MED ORDER — SODIUM CHLORIDE 0.9 % IV SOLN
510.0000 mg | Freq: Once | INTRAVENOUS | Status: AC
  Administered 2022-02-10: 510 mg via INTRAVENOUS
  Filled 2022-02-10: qty 17

## 2022-02-10 NOTE — Progress Notes (Signed)
Diagnosis: Iron Deficiency Anemia ? ?Provider:  Marshell Garfinkel, MD ? ?Procedure: Infusion ? ?IV Type: Peripheral, IV Location: L Antecubital ? ?Feraheme (Ferumoxytol), , Dose: 510 mg ? ?Infusion Start Time: 4008 ? ?Infusion Stop Time: 6761 ? ?Post Infusion IV Care: Peripheral IV Discontinued ? ?Discharge: Condition: Good, Destination: Home . AVS provided to patient.  ? ?Performed by:  Beryle Flock, RN  ?  ? ?

## 2022-02-11 ENCOUNTER — Encounter (HOSPITAL_COMMUNITY)
Admission: RE | Admit: 2022-02-11 | Discharge: 2022-02-11 | Disposition: A | Discharge status: Home / Self Care | Payer: Medicare PPO | Source: Ambulatory Visit | Attending: Pulmonary Disease | Admitting: Pulmonary Disease

## 2022-02-11 DIAGNOSIS — J8410 Pulmonary fibrosis, unspecified: Secondary | ICD-10-CM | POA: Diagnosis not present

## 2022-02-11 DIAGNOSIS — J849 Interstitial pulmonary disease, unspecified: Secondary | ICD-10-CM

## 2022-02-11 DIAGNOSIS — I272 Pulmonary hypertension, unspecified: Secondary | ICD-10-CM | POA: Diagnosis not present

## 2022-02-11 NOTE — Progress Notes (Signed)
Daily Session Note ? ?Patient Details  ?Name: Paula Ward ?MRN: [id number] ?Date of Birth: [birth year] ?Referring Provider:   ?Flowsheet Row Pulmonary Rehab Walk Test from 01/06/2022 in [place]  ?Referring Provider [name]  ? ?  ? ? ?Encounter Date: 02/11/2022 ? ?Check In: ? Session Check In - 02/11/22 1154   ? ?  ? Check-In  ? Supervising physician immediately available to respond to emergencies [facility] immediately available   ? Physician(s) Dr. Pietro Cassis   ? Location [facility]   ? Staff Present Rosebud Poles, RN, BSN;Trine Fread Ysidro Evert, Cathleen Fears, MS, ACSM-CEP, Exercise Physiologist;Carlette Wilber Oliphant, RN, BSN   ? Virtual Visit No   ? Medication changes reported     No   ? Fall or balance concerns reported    No   ? Tobacco Cessation No Change   ? Warm-up and Cool-down Performed as group-led instruction   ? Resistance Training Performed Yes   ? VAD Patient? No   ? PAD/SET Patient? No   ?  ? Pain Assessment  ? Currently in Pain? No/denies   ? Pain Score 0-No pain   ? Multiple Pain Sites No   ? ?  ?  ? ?  ? ? ?Capillary Blood Glucose: ?No results found for this or any previous visit (from the past 24 hour(s)). ? ? ? ?Social History  ? ?Tobacco Use  ?Smoking Status Never  ?Smokeless Tobacco Never  ? ? ?Goals Met:  ?Exercise tolerated well ?No report of concerns or symptoms today ?Strength training completed today ? ?Goals Unmet:  ?Not Applicable ? ?Comments: Service time is from 1015 to 1134 ? ? ? ?Dr. Rodman Pickle is Medical Director for Pulmonary Rehab at [facility].  ?

## 2022-02-12 NOTE — Progress Notes (Signed)
Pulmonary Individual Treatment Plan ? ?Patient Details  ?Name: Paula Ward ?MRN: [id number] ?Date of Birth: 1935/12/01 ?Referring Provider:   ?Flowsheet Row Pulmonary Rehab Walk Test from 01/06/2022 in [place]  ?Referring Provider [name]  ? ?  ? ? ?Initial Encounter Date:  ?Flowsheet Row Pulmonary Rehab Walk Test from 01/06/2022 in [place]  ?Date 01/06/22  ? ?  ? ? ?Visit Diagnosis: Interstitial lung disease ([place]) ? ?Patient's Home Medications on Admission:  ? ?Current Outpatient Medications:  ?  acetaminophen (TYLENOL) 325 MG tablet, Take 650 mg by mouth every 6 (six) hours as needed for mild pain. , Disp: , Rfl:  ?  albuterol (VENTOLIN HFA) 108 (90 Base) MCG/ACT inhaler, INHALE 2 PUFFS INTO THE LUNGS EVERY 6 HOURS AS NEEDED FOR WHEEZING OR SHORTNESS OF BREATH, Disp: 6.7 g, Rfl: 3 ?  ALPRAZolam (XANAX) 0.25 MG tablet, Take 0.5 tablets (0.125 mg total) by mouth at bedtime as needed. (Patient taking differently: Take 0.125 mg by mouth at bedtime as needed for anxiety or sleep.), Disp: 30 tablet, Rfl: 3 ?  diltiazem (CARDIZEM CD) 120 MG 24 hr capsule, Take 120 mg by mouth daily., Disp: , Rfl:  ?  fluticasone (FLONASE) 50 MCG/ACT nasal spray, INSTILL 1 SPRAY INTO EACH NOSTRIL TWICE A DAY IF NEEDED (Patient taking differently: Place 1 spray into both nostrils daily as needed for allergies.), Disp: 48 g, Rfl: 3 ?  furosemide (LASIX) 20 MG tablet, Take 1 tablet by mouth once daily, Disp: 90 tablet, Rfl: 0 ?  lansoprazole (PREVACID) 30 MG capsule, Take 1 capsule (30 mg total) by mouth 2 (two) times daily before a meal., Disp: 60 capsule, Rfl: 6 ?  montelukast (SINGULAIR) 10 MG tablet, TAKE 1 TABLET BY MOUTH AT BEDTIME, Disp: 90 tablet, Rfl: 0 ?  Mouthwashes (DRY MOUTH MOUTHWASH) LIQD, Use as directed 5 mLs in the mouth or throat daily as needed (dry mouth). Biotene, Disp: , Rfl:  ?  Multiple Vitamins-Iron (MULTIVITAMIN/IRON PO), Take 1  tablet by mouth daily., Disp: , Rfl:  ?  OPSUMIT 10 MG TABS, Take 10 mg by mouth daily., Disp: , Rfl:  ?  OXYGEN, Inhale into the lungs. 2 LMP at night and upon exertion, Disp: , Rfl:  ?  spironolactone (ALDACTONE) 25 MG tablet, Take 12.5 mg by mouth daily., Disp: , Rfl:  ?  sucralfate (CARAFATE) 1 g tablet, Take 1 tablet (1 g total) by mouth every 6 (six) hours as needed., Disp: 30 tablet, Rfl: 3 ? ?Past Medical History: ?Past Medical History:  ?Diagnosis Date  ? Anemia   ? Angiodysplasia of stomach and duodenum   ? Arthus phenomenon   ? AVM (arteriovenous malformation)   ? Blood transfusion without reported diagnosis   ? last 4 units 12-22-15, Iron infusion x2 last -01-07-16,01-14-16.  ? Breast cancer ([place]) 1989  ? Left  ? Candida esophagitis ([place])   ? Cataract   ? Chronic kidney disease   ? Chronic mild renal insuffiency  ? CREST syndrome ([place])   ? GERD (gastroesophageal reflux disease)   ? w/ HH  ? Interstitial lung disease ([place])   ? Nodule of kidney   ? Pericardial effusion   ? PONV (postoperative nausea and vomiting)   ? Pulmonary embolus ([place]) 2003  ? Pulmonary hypertension ([place])   ? followed by Dr Gaynell Face at [facility], now Dr. Maryjean Ka visit end 2'17.  ? Rectal incontinence   ? Renal cell carcinoma (  [place])   ? Scleroderma ([place])   ? Tubular adenoma of colon   ? Uterine polyp   ? ? ?Tobacco Use: ?Social History  ? ?Tobacco Use  ?Smoking Status Never  ?Smokeless Tobacco Never  ? ? ?Labs: ?Review Flowsheet   ? ?  ?  Latest Ref Rng & Units 01/30/2014 02/11/2017 06/24/2018 01/06/2020  ?Labs for ITP Cardiac and Pulmonary Rehab  ?Cholestrol 0 - 200 mg/dL 180   199   158     ?LDL (calc) 0 - 99 mg/dL 111   115   78     ?HDL-C >39.00 mg/dL 51.90   60.70   57.50     ?Trlycerides 0.0 - 149.0 mg/dL 87.0   115.0   112.0     ?Hemoglobin A1c 4.6 - 6.5 %    5.2    ?Bicarbonate 20.0 - 28.0 mmol/L      ?O2 Saturation %      ? ?  03/29/2021  ?Labs for ITP Cardiac and Pulmonary Rehab  ?Cholestrol   ?LDL (calc)   ?HDL-C   ?Trlycerides    ?Hemoglobin A1c   ?Bicarbonate 26.1    ?O2 Saturation 97.1    ?  ? ? Multiple values from one day are sorted in reverse-chronological order  ?  ?  ? ? ?Capillary Blood Glucose: ?Lab Results  ?Component Value Date  ? GLUCAP 81 12/21/2015  ? GLUCAP 91 12/21/2015  ? GLUCAP 71 12/21/2015  ? GLUCAP 85 12/21/2015  ? GLUCAP 91 12/21/2015  ? ? ? ?Pulmonary Assessment Scores: ? Pulmonary Assessment Scores   ? ? [place] Name 01/06/22 0930  ?  ?  ?  ? ADL UCSD  ? ADL Phase Entry    ? SOB Score total 11    ?  ? CAT Score  ? CAT Score 7    ?  ? mMRC Score  ? mMRC Score 1    ? ?  ?  ? ?  ? ?UCSD: ?Self-administered rating of dyspnea associated with activities of daily living (ADLs) ?6-point scale (0 = "not at all" to 5 = "maximal or unable to do because of breathlessness")  ?Scoring Scores range from 0 to 120.  Minimally important difference is 5 units ? ?CAT: ?CAT can identify the health impairment of COPD patients and is better correlated with disease progression.  ?CAT has a scoring range of zero to 40. The CAT score is classified into four groups of low (less than 10), medium (10 - 20), high (21-30) and very high (31-40) based on the impact level of disease on health status. A CAT score over 10 suggests significant symptoms.  A worsening CAT score could be explained by an exacerbation, poor medication adherence, poor inhaler technique, or progression of COPD or comorbid conditions.  ?CAT MCID is 2 points ? ?mMRC: ?mMRC (Modified Medical Research Council) Dyspnea Scale is used to assess the degree of baseline functional disability in patients of respiratory disease due to dyspnea. ?No minimal important difference is established. A decrease in score of 1 point or greater is considered a positive change.  ? ?Pulmonary Function Assessment: ? Pulmonary Function Assessment - 01/06/22 0947   ? ?  ? Breath  ? Bilateral Breath Sounds [place]   ? Shortness of Breath Yes   ? ?  ?  ? ?  ? ? ?Exercise Target Goals: ?Exercise Program  Goal: ?Individual exercise prescription set using results from initial 6 min walk test and THRR while considering  patient?s  activity barriers and safety.  ? ?Exercise Prescription Goal: ?Initial exercise prescription builds to 30-45 minutes a day of aerobic activity, 2-3 days per week.  Home exercise guidelines will be given to patient during program as part of exercise prescription that the participant will acknowledge. ? ?Activity Barriers & Risk Stratification: ? Activity Barriers & Cardiac Risk Stratification - 01/06/22 0936   ? ?  ? Activity Barriers & Cardiac Risk Stratification  ? Activity Barriers Arthritis;Neck/Spine Problems;History of Falls;Balance Concerns;Muscular Weakness;Deconditioning;Shortness of Breath   ? Cardiac Risk Stratification Moderate   ? ?  ?  ? ?  ? ? ?6 Minute Walk: ? 6 Minute Walk   ? ? [place] Name 01/06/22 1208  ?  ?  ?  ? 6 Minute Walk  ? Phase Initial    ? Distance 1230 feet    ? Walk Time 6 minutes    ? # of Rest Breaks 0    ? MPH 2.39    ? METS 2.57    ? RPE 9.5    ? Perceived Dyspnea  0.5    ? VO2 Peak 8.98    ? Symptoms No    ? Resting HR 71 bpm    ? Resting BP 100/58    ? Resting Oxygen Saturation  99 %    ? Exercise Oxygen Saturation  during 6 min walk 89 %    ? Max Ex. HR 118 bpm    ? Max Ex. BP 116/60    ? 2 Minute Post BP 108/50    ?  ? Interval HR  ? 1 Minute HR 111    ? 2 Minute HR 111    ? 3 Minute HR 111    ? 4 Minute HR 113    ? 5 Minute HR 112    ? 6 Minute HR 118    ? 2 Minute Post HR 101    ? Interval Heart Rate? Yes    ?  ? Interval Oxygen  ? Interval Oxygen? Yes    ? Baseline Oxygen Saturation % 99 %    ? 1 Minute Oxygen Saturation % 94 %    ? 1 Minute Liters of Oxygen 0 L    ? 2 Minute Oxygen Saturation % 89 %    ? 2 Minute Liters of Oxygen 0 L    ? 3 Minute Oxygen Saturation % 89 %    ? 3 Minute Liters of Oxygen 0 L    ? 4 Minute Oxygen Saturation % 93 %    ? 4 Minute Liters of Oxygen 0 L    ? 5 Minute Oxygen Saturation % 91 %    ? 5 Minute Liters of Oxygen 0 L     ? 6 Minute Oxygen Saturation % 92 %    ? 6 Minute Liters of Oxygen 0 L    ? 2 Minute Post Oxygen Saturation % 93 %    ? 2 Minute Post Liters of Oxygen 0 L    ? ?  ?  ? ?  ? ? ?Oxygen Initial Assessment: ? Oxygen Initi

## 2022-02-13 ENCOUNTER — Other Ambulatory Visit (HOSPITAL_COMMUNITY): Payer: Self-pay | Admitting: *Deleted

## 2022-02-13 ENCOUNTER — Encounter (HOSPITAL_COMMUNITY)
Admission: RE | Admit: 2022-02-13 | Discharge: 2022-02-13 | Disposition: A | Discharge status: Home / Self Care | Payer: Medicare PPO | Source: Ambulatory Visit | Attending: Pulmonary Disease | Admitting: Pulmonary Disease

## 2022-02-13 DIAGNOSIS — J849 Interstitial pulmonary disease, unspecified: Secondary | ICD-10-CM

## 2022-02-13 DIAGNOSIS — J8410 Pulmonary fibrosis, unspecified: Secondary | ICD-10-CM | POA: Diagnosis not present

## 2022-02-13 DIAGNOSIS — I272 Pulmonary hypertension, unspecified: Secondary | ICD-10-CM | POA: Diagnosis not present

## 2022-02-13 NOTE — Progress Notes (Signed)
Daily Session Note ? ?Patient Details  ?Name: RUTHELLA KIRCHMAN ?MRN: [id number] ?Date of Birth: 18-Feb-1936 ?Referring Provider:   ?Flowsheet Row Pulmonary Rehab Walk Test from 01/06/2022 in [facility]  ?Referring Provider [name]  ? ?  ? ? ?Encounter Date: 02/13/2022 ? ?Check In: ? Session Check In - 02/13/22 1131   ? ?  ? Check-In  ? Supervising physician immediately available to respond to emergencies [facility] immediately available   ? Physician(s) Dr. Bonner Puna   ? Location [facility]   ? Staff Present Elmon Else, MS, ACSM-CEP, Exercise Physiologist;Jetta Gilford Rile BS, ACSM EP-C, Exercise Physiologist;Lisa Ysidro Evert, RN   ? Virtual Visit No   ? Medication changes reported     No   ? Fall or balance concerns reported    No   ? Tobacco Cessation No Change   ? Warm-up and Cool-down Performed as group-led instruction   ? Resistance Training Performed Yes   ? VAD Patient? No   ? PAD/SET Patient? No   ?  ? Pain Assessment  ? Currently in Pain? No/denies   ? Multiple Pain Sites No   ? ?  ?  ? ?  ? ? ?Capillary Blood Glucose: ?No results found for this or any previous visit (from the past 24 hour(s)). ? ? ? ?Social History  ? ?Tobacco Use  ?Smoking Status Never  ?Smokeless Tobacco Never  ? ? ?Goals Met:  ?Proper associated with RPD/PD & O2 Sat ?Exercise tolerated well ?No report of concerns or symptoms today ?Strength training completed today ? ?Goals Unmet:  ?Not Applicable ? ?Comments: Service time is from 1020 to 1142.  ? ? ?Dr. Rodman Pickle is Medical Director for Pulmonary Rehab at [facility].  ?

## 2022-02-14 ENCOUNTER — Encounter (HOSPITAL_COMMUNITY)
Admission: RE | Admit: 2022-02-14 | Discharge: 2022-02-14 | Disposition: A | Discharge status: Home / Self Care | Payer: Medicare PPO | Source: Ambulatory Visit | Attending: Nephrology | Admitting: Nephrology

## 2022-02-14 VITALS — BP 135/46 | HR 75

## 2022-02-14 DIAGNOSIS — N1832 Chronic kidney disease, stage 3b: Secondary | ICD-10-CM | POA: Insufficient documentation

## 2022-02-14 DIAGNOSIS — D638 Anemia in other chronic diseases classified elsewhere: Secondary | ICD-10-CM | POA: Diagnosis not present

## 2022-02-14 LAB — POCT HEMOGLOBIN-HEMACUE: Hemoglobin: 11.4 g/dL — ABNORMAL LOW (ref 12.0–15.0)

## 2022-02-14 MED ORDER — EPOETIN ALFA-EPBX 10000 UNIT/ML IJ SOLN
INTRAMUSCULAR | Status: AC
  Filled 2022-02-14: qty 1

## 2022-02-14 MED ORDER — EPOETIN ALFA-EPBX 10000 UNIT/ML IJ SOLN
10000.0000 [IU] | INTRAMUSCULAR | Status: DC
  Administered 2022-02-14: 10000 [IU] via SUBCUTANEOUS

## 2022-02-16 ENCOUNTER — Encounter: Payer: Self-pay | Admitting: Gastroenterology

## 2022-02-18 ENCOUNTER — Encounter (HOSPITAL_COMMUNITY)
Admission: RE | Admit: 2022-02-18 | Discharge: 2022-02-18 | Disposition: A | Discharge status: Home / Self Care | Payer: Medicare PPO | Source: Ambulatory Visit | Attending: Pulmonary Disease | Admitting: Pulmonary Disease

## 2022-02-18 VITALS — Wt 104.1 lb

## 2022-02-18 DIAGNOSIS — Z5189 Encounter for other specified aftercare: Secondary | ICD-10-CM | POA: Diagnosis not present

## 2022-02-18 DIAGNOSIS — J849 Interstitial pulmonary disease, unspecified: Secondary | ICD-10-CM | POA: Diagnosis not present

## 2022-02-18 NOTE — Progress Notes (Signed)
Daily Session Note ? ?Patient Details  ?Name: Paula Ward ?MRN: [id number] ?Date of Birth: 09-01-1936 ?Referring Provider:   ?Flowsheet Row Pulmonary Rehab Walk Test from 01/06/2022 in [place]  ?Referring Provider [name]  ? ?  ? ? ?Encounter Date: 02/18/2022 ? ?Check In: ? Session Check In - 02/18/22 1127   ? ?  ? Check-In  ? Supervising physician immediately available to respond to emergencies [facility] immediately available   ? Physician(s) Dr. Bonner Puna   ? [facility]   ? Staff Present Rosebud Poles, RN, BSN;Carlette Wilber Oliphant, RN, Quentin Ore, MS, ACSM-CEP, Exercise Physiologist;Olinty Celesta Aver, MS, ACSM CEP, Exercise Physiologist   ? Virtual Visit No   ? Medication changes reported     No   ? Fall or balance concerns reported    No   ? Tobacco Cessation No Change   ? Warm-up and Cool-down Performed as group-led instruction   ? Resistance Training Performed Yes   ? VAD Patient? No   ? PAD/SET Patient? No   ?  ? Pain Assessment  ? Currently in Pain? No/denies   ? Multiple Pain Sites No   ? ?  ?  ? ?  ? ? ?Capillary Blood Glucose: ?No results found for this or any previous visit (from the past 24 hour(s)). ? ? Exercise Prescription Changes - 02/18/22 1200   ? ?  ? Response to Exercise  ? Blood Pressure (Admit) 126/58   ? Blood Pressure (Exercise) 120/56   ? Blood Pressure (Exit) 102/52   ? Heart Rate (Admit) 83 bpm   ? Heart Rate (Exercise) 108 bpm   ? Heart Rate (Exit) 83 bpm   ? Oxygen Saturation (Admit) 98 %   ? Oxygen Saturation (Exercise) 93 %   ? Oxygen Saturation (Exit) 93 %   ? Rating of Perceived Exertion (Exercise) 9   ? Perceived Dyspnea (Exercise) 1   ? Duration Continue with 30 min of aerobic exercise without signs/symptoms of physical distress.   ? Intensity THRR unchanged   ?  ? Progression  ? Progression Continue to progress workloads to maintain intensity without signs/symptoms of physical distress.   ?  ?  Resistance Training  ? Training Prescription Yes   ? Weight Red Bands   ? Reps 10-15   ? Time 10 Minutes   ?  ? Recumbant Bike  ? Level 2.5   ? Minutes 15   ? METs 2.4   ?  ? Track  ? Laps 15   ? Minutes 15   ? METs 2.74   ? ?  ?  ? ?  ? ? ?Social History  ? ?Tobacco Use  ?Smoking Status Never  ?Smokeless Tobacco Never  ? ? ?Goals Met:  ?Proper associated with RPD/PD & O2 Sat ?Independence with exercise equipment ?Exercise tolerated well ?No report of concerns or symptoms today ?Strength training completed today ? ?Goals Unmet:  ?Not Applicable ? ?Comments: Service time is from 1008 to 1140.  ? ? ?Dr. Rodman Pickle is Medical Director for Pulmonary Rehab at [facility].  ?

## 2022-02-20 ENCOUNTER — Encounter (HOSPITAL_COMMUNITY): Payer: Medicare PPO

## 2022-02-25 ENCOUNTER — Encounter (HOSPITAL_COMMUNITY)
Admission: RE | Admit: 2022-02-25 | Discharge: 2022-02-25 | Disposition: A | Discharge status: Home / Self Care | Payer: Medicare PPO | Source: Ambulatory Visit | Attending: Pulmonary Disease | Admitting: Pulmonary Disease

## 2022-02-25 DIAGNOSIS — J849 Interstitial pulmonary disease, unspecified: Secondary | ICD-10-CM | POA: Diagnosis not present

## 2022-02-25 DIAGNOSIS — Z5189 Encounter for other specified aftercare: Secondary | ICD-10-CM | POA: Diagnosis not present

## 2022-02-25 NOTE — Progress Notes (Signed)
Daily Session Note ? ?Patient Details  ?Name: Paula Ward ?MRN: [id number] ?Date of Birth: 1936/03/20 ?Referring Provider:   ?Flowsheet Row Pulmonary Rehab Walk Test from 01/06/2022 in [place]  ?Referring Provider [name]  ? ?  ? ? ?Encounter Date: 02/25/2022 ? ?Check In: ? Session Check In - 02/25/22 1121   ? ?  ? Check-In  ? Supervising physician immediately available to respond to emergencies [facility] immediately available   ? Physician(s) Dr. Karleen [place]   ? [facility]   ? Staff Present Rosebud Poles, RN, BSN;Carlette Wilber Oliphant, RN, Quentin Ore, MS, ACSM-CEP, Exercise Physiologist   ? Virtual Visit No   ? Medication changes reported     No   ? Fall or balance concerns reported    No   ? Tobacco Cessation No Change   ? Warm-up and Cool-down Performed as group-led instruction   ? Resistance Training Performed Yes   ? VAD Patient? No   ? PAD/SET Patient? No   ?  ? Pain Assessment  ? Currently in Pain? No/denies   ? Multiple Pain Sites No   ? ?  ?  ? ?  ? ? ?Capillary Blood Glucose: ?No results found for this or any previous visit (from the past 24 hour(s)). ? ? ? ?Social History  ? ?Tobacco Use  ?Smoking Status Never  ?Smokeless Tobacco Never  ? ? ?Goals Met:  ?Proper associated with RPD/PD & O2 Sat ?Exercise tolerated well ?No report of concerns or symptoms today ?Strength training completed today ? ?Goals Unmet:  ?Not Applicable ? ?Comments: Service time is from 1012 to 1130 ? ? ? ?Dr. Rodman Pickle is Medical Director for Pulmonary Rehab at [facility].  ?

## 2022-02-27 ENCOUNTER — Encounter (HOSPITAL_COMMUNITY)
Admission: RE | Admit: 2022-02-27 | Discharge: 2022-02-27 | Disposition: A | Discharge status: Home / Self Care | Payer: Medicare PPO | Source: Ambulatory Visit | Attending: Pulmonary Disease | Admitting: Pulmonary Disease

## 2022-02-27 ENCOUNTER — Other Ambulatory Visit: Payer: Self-pay

## 2022-02-27 DIAGNOSIS — B351 Tinea unguium: Secondary | ICD-10-CM | POA: Diagnosis not present

## 2022-02-27 DIAGNOSIS — L851 Acquired keratosis [keratoderma] palmaris et plantaris: Secondary | ICD-10-CM | POA: Diagnosis not present

## 2022-02-27 DIAGNOSIS — J849 Interstitial pulmonary disease, unspecified: Secondary | ICD-10-CM | POA: Diagnosis not present

## 2022-02-27 DIAGNOSIS — Z5189 Encounter for other specified aftercare: Secondary | ICD-10-CM | POA: Diagnosis not present

## 2022-02-27 MED ORDER — PANTOPRAZOLE SODIUM 40 MG PO TBEC: 40.0000 mg | DELAYED_RELEASE_TABLET | Freq: Two times a day (BID) | ORAL | 5 refills | Status: DC

## 2022-02-27 NOTE — Progress Notes (Signed)
Home Exercise Prescription ?I have reviewed a Home Exercise Prescription with Paula Ward. She uses her stationary cycling maching 1 non-rehab day/wk for 15-30 min/day. I encouraged Paula Ward to increase her frequency to 2 non-rehab days/wk for 30 min/day. I discussed the purpose of rehab and my goal for her when she graduates the program. Paula Ward showed understanding of my recommendations. Paula Ward seems somewhat motivated to exercise at home. She enjoys coming to rehab as she has noticed a difference in her functional capacity. The patient stated that their goals were to maintain current health. We reviewed exercise guidelines, target heart rate during exercise, RPE Scale, weather conditions, endpoints for exercise, warmup and cool down. The patient is encouraged to come to me with any questions. I will continue to follow up with the patient to assist them with progression and safety.   ? ?Paula Plumber, MS, ACSM-CEP ?02/27/2022 ?12:24 PM ? ?

## 2022-02-27 NOTE — Progress Notes (Signed)
Daily Session Note ? ?Patient Details  ?Name: Paula Ward ?MRN: [id number] ?Date of Birth: 02/07/1936 ?Referring Provider:   ?Flowsheet Row Pulmonary Rehab Walk Test from 01/06/2022 in [place]  ?Referring Provider [name]  ? ?  ? ? ?Encounter Date: 02/27/2022 ? ?Check In: ? Session Check In - 02/27/22 1119   ? ?  ? Check-In  ? Supervising physician immediately available to respond to emergencies [facility] immediately available   ? Physician(s) Dr. Sloan Leiter   ? [facility]   ? Staff Present Paula Small, RN, Paula Ore, MS, ACSM-CEP, Exercise Physiologist;Paula Rosezella [place], RN, Paula Ward   ? Virtual Visit No   ? Medication changes reported     No   ? Fall or balance concerns reported    No   ? Tobacco Cessation No Change   ? Warm-up and Cool-down Performed on first and last piece of equipment   ? Resistance Training Performed Yes   ? VAD Patient? No   ?  ? Pain Assessment  ? Currently in Pain? No/denies   ? ?  ?  ? ?  ? ? ?Capillary Blood Glucose: ?No results found for this or any previous visit (from the past 24 hour(s)). ? ? ? ?Social History  ? ?Tobacco Use  ?Smoking Status Never  ?Smokeless Tobacco Never  ? ? ?Goals Met:  ?Proper associated with RPD/PD & O2 Sat ?Independence with exercise equipment ?Exercise tolerated well ?No report of concerns or symptoms today ?Strength training completed today ? ?Goals Unmet:  ?Not Applicable ? ?Comments: Service time is from 1022 to 1142.  ? ? ?Dr. Rodman Pickle is Medical Director for Pulmonary Rehab at [facility]  ?

## 2022-03-01 ENCOUNTER — Other Ambulatory Visit: Payer: Self-pay | Admitting: Internal Medicine

## 2022-03-04 ENCOUNTER — Encounter (HOSPITAL_COMMUNITY)
Admission: RE | Admit: 2022-03-04 | Discharge: 2022-03-04 | Disposition: A | Discharge status: Home / Self Care | Payer: Medicare PPO | Source: Ambulatory Visit | Attending: Pulmonary Disease | Admitting: Pulmonary Disease

## 2022-03-04 VITALS — Wt 104.9 lb

## 2022-03-04 DIAGNOSIS — J849 Interstitial pulmonary disease, unspecified: Secondary | ICD-10-CM

## 2022-03-04 DIAGNOSIS — Z5189 Encounter for other specified aftercare: Secondary | ICD-10-CM | POA: Diagnosis not present

## 2022-03-04 NOTE — Progress Notes (Signed)
Daily Session Note ? ?Patient Details  ?Name: Paula Ward ?MRN: [id number] ?Date of Birth: 01/23/1936 ?Referring Provider:   ?Flowsheet Row Pulmonary Rehab Walk Test from 01/06/2022 in [facility]  ?Referring Provider [name]  ? ?  ? ? ?Encounter Date: 03/04/2022 ? ?Check In: ? Session Check In - 03/04/22 1210   ? ?  ? Check-In  ? Supervising physician immediately available to respond to emergencies [facility] immediately available   ? Physician(s) Dr. Ghimire   ? [facility]   ? Staff Present Kaylee Davis, MS, ACSM-CEP, Exercise Physiologist;Lisa Hughes, RN;Portia Payne, RN, BSN   ? Medication changes reported     No   ? Fall or balance concerns reported    No   ? Tobacco Cessation No Change   ? Warm-up and Cool-down Performed as group-led instruction   ? Resistance Training Performed Yes   ? VAD Patient? No   ? PAD/SET Patient? No   ?  ? Pain Assessment  ? Currently in Pain? No/denies   ? Multiple Pain Sites No   ? ?  ?  ? ?  ? ? ?Capillary Blood Glucose: ?No results found. However, due to the size of the patient record, not all encounters were searched. Please check Results Review for a complete set of results. ? ? Exercise Prescription Changes - 03/04/22 1200   ? ?  ? Response to Exercise  ? Blood Pressure (Admit) 122/58   ? Blood Pressure (Exercise) 130/40   ? Blood Pressure (Exit) 118/58   ? Heart Rate (Admit) 102 bpm   ? Heart Rate (Exercise) 91 bpm   ? Heart Rate (Exit) 83 bpm   ? Oxygen Saturation (Admit) 97 %   ? Oxygen Saturation (Exercise) 92 %   ? Oxygen Saturation (Exit) 97 %   ? Rating of Perceived Exertion (Exercise) 9   ? Perceived Dyspnea (Exercise) 1   ? Duration Continue with 30 min of aerobic exercise without signs/symptoms of physical distress.   ? Intensity THRR unchanged   ?  ? Progression  ? Progression Continue to progress workloads to maintain intensity without signs/symptoms of physical distress.   ?  ?  Recumbant Bike  ? Level 2.5   ? Minutes 15   ? METs 2.2   ?  ? Track  ? Laps 13   ? Minutes 15   ? ?  ?  ? ?  ? ? ?Social History  ? ?Tobacco Use  ?Smoking Status Never  ?Smokeless Tobacco Never  ? ? ?Goals Met:  ?Exercise tolerated well ?No report of concerns or symptoms today ?Strength training completed today ? ?Goals Unmet:  ?Not Applicable ? ? ?Comments: Service time is from 1018 am ? to 1126 am. ? ? ? ?Dr. Jane Ellison is Medical Director for Pulmonary Rehab at [facility].  ?

## 2022-03-06 ENCOUNTER — Encounter (HOSPITAL_COMMUNITY)
Admission: RE | Admit: 2022-03-06 | Discharge: 2022-03-06 | Disposition: A | Discharge status: Home / Self Care | Payer: Medicare PPO | Source: Ambulatory Visit | Attending: Pulmonary Disease | Admitting: Pulmonary Disease

## 2022-03-06 DIAGNOSIS — Z5189 Encounter for other specified aftercare: Secondary | ICD-10-CM | POA: Diagnosis not present

## 2022-03-06 DIAGNOSIS — J849 Interstitial pulmonary disease, unspecified: Secondary | ICD-10-CM | POA: Diagnosis not present

## 2022-03-06 NOTE — Progress Notes (Signed)
Daily Session Note  Patient Details  Name: MURIEL HANNOLD MRN: [id number] Date of Birth: 1935-12-13 Referring Provider:   April Manson Pulmonary Rehab Walk Test from 01/06/2022 in [place]  Referring Provider Glenford Peers       Encounter Date: 03/06/2022  Check In:  Session Check In - 03/06/22 1121       Check-In   Supervising physician immediately available to respond to emergencies [facility] immediately available    Physician(s) Dr. Tawanna Solo    Location [facility]    Staff Present Rosebud Poles, RN, Quentin Ore, MS, ACSM-CEP, Exercise Physiologist;Lisa Ysidro Evert, RN    Virtual Visit No    Medication changes reported     No    Fall or balance concerns reported    No    Tobacco Cessation No Change    Warm-up and Cool-down Performed as group-led instruction    Resistance Training Performed Yes    VAD Patient? No    PAD/SET Patient? No      Pain Assessment   Currently in Pain? No/denies    Multiple Pain Sites No             Capillary Blood Glucose: No results found. However, due to the size of the patient record, not all encounters were searched. Please check Results Review for a complete set of results.    Social History   Tobacco Use  Smoking Status Never  Smokeless Tobacco Never    Goals Met:  Proper associated with RPD/PD & O2 Sat Exercise tolerated well No report of concerns or symptoms today Strength training completed today  Goals Unmet:  Not Applicable  Comments: Service time is from 1023 to [place]    Dr. Rodman Pickle is Medical Director for Pulmonary Rehab at [facility].

## 2022-03-11 ENCOUNTER — Encounter (HOSPITAL_COMMUNITY): Payer: Medicare PPO

## 2022-03-12 ENCOUNTER — Other Ambulatory Visit (INDEPENDENT_AMBULATORY_CARE_PROVIDER_SITE_OTHER): Payer: Medicare PPO

## 2022-03-12 DIAGNOSIS — N1831 Chronic kidney disease, stage 3a: Secondary | ICD-10-CM

## 2022-03-12 LAB — CBC WITH DIFFERENTIAL/PLATELET
Basophils Absolute: 0 10*3/uL (ref 0.0–0.1)
Basophils Relative: 0.4 % (ref 0.0–3.0)
Eosinophils Absolute: 0.1 10*3/uL (ref 0.0–0.7)
Eosinophils Relative: 1.6 % (ref 0.0–5.0)
HCT: 36 % (ref 36.0–46.0)
Hemoglobin: 11.9 g/dL — ABNORMAL LOW (ref 12.0–15.0)
Lymphocytes Relative: 11.1 % — ABNORMAL LOW (ref 12.0–46.0)
Lymphs Abs: 0.8 10*3/uL (ref 0.7–4.0)
MCHC: 33 g/dL (ref 30.0–36.0)
MCV: 108.5 fl — ABNORMAL HIGH (ref 78.0–100.0)
Monocytes Absolute: 0.5 10*3/uL (ref 0.1–1.0)
Monocytes Relative: 6.8 % (ref 3.0–12.0)
Neutro Abs: 6.1 10*3/uL (ref 1.4–7.7)
Neutrophils Relative %: 80.1 % — ABNORMAL HIGH (ref 43.0–77.0)
Platelets: 208 10*3/uL (ref 150.0–400.0)
RBC: 3.32 Mil/uL — ABNORMAL LOW (ref 3.87–5.11)
RDW: 14.2 % (ref 11.5–15.5)
WBC: 7.6 10*3/uL (ref 4.0–10.5)

## 2022-03-12 LAB — COMPREHENSIVE METABOLIC PANEL
ALT: 13 U/L (ref 0–35)
AST: 24 U/L (ref 0–37)
Albumin: 4.3 g/dL (ref 3.5–5.2)
Alkaline Phosphatase: 58 U/L (ref 39–117)
BUN: 36 mg/dL — ABNORMAL HIGH (ref 6–23)
CO2: 30 mEq/L (ref 19–32)
Calcium: 9.7 mg/dL (ref 8.4–10.5)
Chloride: 98 mEq/L (ref 96–112)
Creatinine, Ser: 1.26 mg/dL — ABNORMAL HIGH (ref 0.40–1.20)
GFR: 39.47 mL/min — ABNORMAL LOW (ref >60.00)
Glucose, Bld: 101 mg/dL — ABNORMAL HIGH (ref 70–99)
Potassium: 3.8 mEq/L (ref 3.5–5.1)
Sodium: 137 mEq/L (ref 135–145)
Total Bilirubin: 0.6 mg/dL (ref 0.2–1.2)
Total Protein: 7.1 g/dL (ref 6.0–8.3)

## 2022-03-12 NOTE — Progress Notes (Signed)
Pulmonary Individual Treatment Plan  Patient Details  Name: Paula Ward MRN: [id number] Date of Birth: 1936/04/02 Referring Provider:   April Manson Pulmonary Rehab Walk Test from 01/06/2022 in [place]  Referring Provider [name]       Initial Encounter Date:  Flowsheet Row Pulmonary Rehab Walk Test from 01/06/2022 in [place]  Date 01/06/22       Visit Diagnosis: Interstitial lung disease ([place])  Patient's Home Medications on Admission:   Current Outpatient Medications:    acetaminophen (TYLENOL) 325 MG tablet, Take 650 mg by mouth every 6 (six) hours as needed for mild pain. , Disp: , Rfl:    albuterol (VENTOLIN HFA) 108 (90 Base) MCG/ACT inhaler, INHALE 2 PUFFS INTO THE LUNGS EVERY 6 HOURS AS NEEDED FOR WHEEZING OR SHORTNESS OF BREATH, Disp: 6.7 g, Rfl: 3   ALPRAZolam (XANAX) 0.25 MG tablet, Take 0.5 tablets (0.125 mg total) by mouth at bedtime as needed. (Patient taking differently: Take 0.125 mg by mouth at bedtime as needed for anxiety or sleep.), Disp: 30 tablet, Rfl: 3   diltiazem (CARDIZEM CD) 120 MG 24 hr capsule, Take 120 mg by mouth daily., Disp: , Rfl:    fluticasone (FLONASE) 50 MCG/ACT nasal spray, INSTILL 1 SPRAY INTO EACH NOSTRIL TWICE A DAY IF NEEDED (Patient taking differently: Place 1 spray into both nostrils daily as needed for allergies.), Disp: 48 g, Rfl: 3   furosemide (LASIX) 20 MG tablet, Take 1 tablet by mouth once daily, Disp: 90 tablet, Rfl: 0   montelukast (SINGULAIR) 10 MG tablet, TAKE 1 TABLET BY MOUTH AT BEDTIME, Disp: 90 tablet, Rfl: 0   Mouthwashes (DRY MOUTH MOUTHWASH) LIQD, Use as directed 5 mLs in the mouth or throat daily as needed (dry mouth). Biotene, Disp: , Rfl:    Multiple Vitamins-Iron (MULTIVITAMIN/IRON PO), Take 1 tablet by mouth daily., Disp: , Rfl:    OPSUMIT 10 MG TABS, Take 10 mg by mouth daily., Disp: , Rfl:    OXYGEN, Inhale into the lungs. 2 LMP  at night and upon exertion, Disp: , Rfl:    pantoprazole (PROTONIX) 40 MG tablet, Take 1 tablet (40 mg total) by mouth 2 (two) times daily before a meal., Disp: 60 tablet, Rfl: 5   spironolactone (ALDACTONE) 25 MG tablet, Take 12.5 mg by mouth daily., Disp: , Rfl:    sucralfate (CARAFATE) 1 g tablet, Take 1 tablet (1 g total) by mouth every 6 (six) hours as needed., Disp: 30 tablet, Rfl: 3  Past Medical History: Past Medical History:  Diagnosis Date   Anemia    Angiodysplasia of stomach and duodenum    Arthus phenomenon    AVM (arteriovenous malformation)    Blood transfusion without reported diagnosis    last 4 units 12-22-15, Iron infusion x2 last -01-07-16,01-14-16.   Breast cancer ([place]) 1989   Left   Candida esophagitis (HCC)    Cataract    Chronic kidney disease    Chronic mild renal insuffiency   CREST syndrome (HCC)    GERD (gastroesophageal reflux disease)    w/ HH   Interstitial lung disease (H[facility])    Nodule of kidney    Pericardial effusion    PONV (postoperative nausea and vomiting)    Pulmonary embolus ([place]) 2003   Pulmonary hypertension ([place])    followed by Dr Gaynell Face at [facility], now Dr. Maryjean Ka visit end 2'17.   Rectal incontinence    Renal cell carcinoma (  [place])    Scleroderma ([place])    Tubular adenoma of colon    Uterine polyp     Tobacco Use: Social History   Tobacco Use  Smoking Status Never  Smokeless Tobacco Never    Labs: Review Flowsheet        Latest Ref Rng & Units 01/30/2014 02/11/2017 06/24/2018 01/06/2020  Labs for ITP Cardiac and Pulmonary Rehab  Cholestrol 0 - 200 mg/dL 180   199   158     LDL (calc) 0 - 99 mg/dL 111   115   78     HDL-C >39.00 mg/dL 51.90   60.70   57.50     Trlycerides 0.0 - 149.0 mg/dL 87.0   115.0   112.0     Hemoglobin A1c 4.6 - 6.5 %    5.2    Bicarbonate 20.0 - 28.0 mmol/L      O2 Saturation %         03/29/2021  Labs for ITP Cardiac and Pulmonary Rehab  Cholestrol   LDL (calc)   HDL-C   Trlycerides    Hemoglobin A1c   Bicarbonate 26.1    O2 Saturation 97.1            Capillary Blood Glucose: Lab Results  Component Value Date   GLUCAP 81 12/21/2015   GLUCAP 91 12/21/2015   GLUCAP 71 12/21/2015   GLUCAP 85 12/21/2015   GLUCAP 91 12/21/2015     Pulmonary Assessment Scores:  Pulmonary Assessment Scores     Row Name 01/06/22 0930 03/06/22 1212       ADL UCSD   ADL Phase Entry Exit    SOB Score total 11 27      CAT Score   CAT Score 7 14      mMRC Score   mMRC Score 1 --            UCSD: Self-administered rating of dyspnea associated with activities of daily living (ADLs) 6-point scale (0 = "not at all" to 5 = "maximal or unable to do because of breathlessness")  Scoring Scores range from 0 to 120.  Minimally important difference is 5 units  CAT: CAT can identify the health impairment of COPD patients and is better correlated with disease progression.  CAT has a scoring range of zero to 40. The CAT score is classified into four groups of low (less than 10), medium (10 - 20), high (21-30) and very high (31-40) based on the impact level of disease on health status. A CAT score over 10 suggests significant symptoms.  A worsening CAT score could be explained by an exacerbation, poor medication adherence, poor inhaler technique, or progression of COPD or comorbid conditions.  CAT MCID is 2 points  mMRC: mMRC (Modified Medical Research Council) Dyspnea Scale is used to assess the degree of baseline functional disability in patients of respiratory disease due to dyspnea. No minimal important difference is established. A decrease in score of 1 point or greater is considered a positive change.   Pulmonary Function Assessment:  Pulmonary Function Assessment - 01/06/22 0947       Breath   Bilateral Breath Sounds Rales    Shortness of Breath Yes             Exercise Target Goals: Exercise Program Goal: Individual exercise prescription set using results from  initial 6 min walk test and THRR while considering  patient's activity barriers and safety.   Exercise Prescription Goal: Initial exercise prescription builds  to 30-45 minutes a day of aerobic activity, 2-3 days per week.  Home exercise guidelines will be given to patient during program as part of exercise prescription that the participant will acknowledge.  Activity Barriers & Risk Stratification:  Activity Barriers & Cardiac Risk Stratification - 01/06/22 0936       Activity Barriers & Cardiac Risk Stratification   Activity Barriers Arthritis;Neck/Spine Problems;History of Falls;Balance Concerns;Muscular Weakness;Deconditioning;Shortness of Breath    Cardiac Risk Stratification Moderate             6 Minute Walk:  6 Minute Walk     Row Name 01/06/22 1208         6 Minute Walk   Phase Initial     Distance 1230 feet     Walk Time 6 minutes     # of Rest Breaks 0     MPH 2.39     METS 2.57     RPE 9.5     Perceived Dyspnea  0.5     VO2 Peak 8.98     Symptoms No     Resting HR 71 bpm     Resting BP 100/58     Resting Oxygen Saturation  99 %     Exercise Oxygen Saturation  during 6 min walk 89 %     Max Ex. HR 118 bpm     Max Ex. BP 116/60     2 Minute Post BP 108/50       Interval HR   1 Minute HR 111     2 Minute HR 111     3 Minute HR 111     4 Minute HR 113     5 Minute HR 112     6 Minute HR 118     2 Minute Post HR 101     Interval Heart Rate? Yes       Interval Oxygen   Interval Oxygen? Yes     Baseline Oxygen Saturation % 99 %     1 Minute Oxygen Saturation % 94 %     1 Minute Liters of Oxygen 0 L     2 Minute Oxygen Saturation % 89 %     2 Minute Liters of Oxygen 0 L     3 Minute Oxygen Saturation % 89 %     3 Minute Liters of Oxygen 0 L     4 Minute Oxygen Saturation % 93 %     4 Minute Liters of Oxygen 0 L     5 Minute Oxygen Saturation % 91 %     5 Minute Liters of Oxygen 0 L     6 Minute Oxygen Saturation % 92 %     6 Minute Liters of  Oxygen 0 L     2 Minute Post Oxygen Saturation % 93 %     2 Minute Post Liters of Oxygen 0 L              Oxygen Initial Assessment:  Oxygen Initial Assessment - 01/06/22 0932       Home Oxygen   Home Oxygen Device Home Concentrator;Portable Concentrator    Sleep Oxygen Prescription Continuous    Liters per minute 2    Home Exercise Oxygen Prescription Continuous    Liters per minute 2    Home Resting Oxygen Prescription None    Compliance with Home Oxygen Use Yes      Initial 6 min Walk   Oxygen Used None  Program Oxygen Prescription   Program Oxygen Prescription None      Intervention   Short Term Goals To learn and exhibit compliance with exercise, home and travel O2 prescription;To learn and understand importance of monitoring SPO2 with pulse oximeter and demonstrate accurate use of the pulse oximeter.;To learn and understand importance of maintaining oxygen saturations>88%;To learn and demonstrate proper pursed lip breathing techniques or other breathing techniques. ;To learn and demonstrate proper use of respiratory medications    Long  Term Goals Exhibits compliance with exercise, home  and travel O2 prescription;Verbalizes importance of monitoring SPO2 with pulse oximeter and return demonstration;Maintenance of O2 saturations>88%;Exhibits proper breathing techniques, such as pursed lip breathing or other method taught during program session;Compliance with respiratory medication;Demonstrates proper use of MDI's             Oxygen Re-Evaluation:  Oxygen Re-Evaluation     Row Name 01/09/22 1608 02/04/22 1619 03/03/22 1150         Program Oxygen Prescription   Program Oxygen Prescription None None None       Home Oxygen   Home Oxygen Device Home Concentrator;Portable Concentrator Home Concentrator;Portable Concentrator Home Concentrator;Portable Concentrator     Sleep Oxygen Prescription Continuous Continuous Continuous     Liters per minute _0 Home Exercise Oxygen Prescription Continuous Continuous Continuous     Liters per minute _1 Home Resting Oxygen Prescription None None None     Compliance with Home Oxygen Use Yes Yes Yes       Goals/Expected Outcomes   Short Term Goals To learn and exhibit compliance with exercise, home and travel O2 prescription;To learn and understand importance of monitoring SPO2 with pulse oximeter and demonstrate accurate use of the pulse oximeter.;To learn and understand importance of maintaining oxygen saturations>88%;To learn and demonstrate proper pursed lip breathing techniques or other breathing techniques. ;To learn and demonstrate proper use of respiratory medications To learn and exhibit compliance with exercise, home and travel O2 prescription;To learn and understand importance of monitoring SPO2 with pulse oximeter and demonstrate accurate use of the pulse oximeter.;To learn and understand importance of maintaining oxygen saturations>88%;To learn and demonstrate proper pursed lip breathing techniques or other breathing techniques. ;To learn and demonstrate proper use of respiratory medications To learn and exhibit compliance with exercise, home and travel O2 prescription;To learn and understand importance of monitoring SPO2 with pulse oximeter and demonstrate accurate use of the pulse oximeter.;To learn and understand importance of maintaining oxygen saturations>88%;To learn and demonstrate proper pursed lip breathing techniques or other breathing techniques. ;To learn and demonstrate proper use of respiratory medications     Long  Term Goals Exhibits compliance with exercise, home  and travel O2 prescription;Verbalizes importance of monitoring SPO2 with pulse oximeter and return demonstration;Maintenance of O2 saturations>88%;Exhibits proper breathing techniques, such as pursed lip breathing or other method taught during program session;Compliance with respiratory medication;Demonstrates proper use of  MDI's Exhibits compliance with exercise, home  and travel O2 prescription;Verbalizes importance of monitoring SPO2 with pulse oximeter and return demonstration;Maintenance of O2 saturations>88%;Exhibits proper breathing techniques, such as pursed lip breathing or other method taught during program session;Compliance with respiratory medication;Demonstrates proper use of MDI's Exhibits compliance with exercise, home  and travel O2 prescription;Verbalizes importance of monitoring SPO2 with pulse oximeter and return demonstration;Maintenance of O2 saturations>88%;Exhibits proper breathing techniques, such as pursed lip breathing or other method taught during program session;Compliance with respiratory medication;Demonstrates proper use of MDI's  Goals/Expected Outcomes Compliance and understanding of oxygen saturation monitoring and breathing techniques to decrease shortness of breath. Compliance and understanding of oxygen saturation monitoring and breathing techniques to decrease shortness of breath. Compliance and understanding of oxygen saturation monitoring and breathing techniques to decrease shortness of breath.              Oxygen Discharge (Final Oxygen Re-Evaluation):  Oxygen Re-Evaluation - 03/03/22 1150       Program Oxygen Prescription   Program Oxygen Prescription None      Home Oxygen   Home Oxygen Device Home Concentrator;Portable Concentrator    Sleep Oxygen Prescription Continuous    Liters per minute 2    Home Exercise Oxygen Prescription Continuous    Liters per minute 2    Home Resting Oxygen Prescription None    Compliance with Home Oxygen Use Yes      Goals/Expected Outcomes   Short Term Goals To learn and exhibit compliance with exercise, home and travel O2 prescription;To learn and understand importance of monitoring SPO2 with pulse oximeter and demonstrate accurate use of the pulse oximeter.;To learn and understand importance of maintaining oxygen  saturations>88%;To learn and demonstrate proper pursed lip breathing techniques or other breathing techniques. ;To learn and demonstrate proper use of respiratory medications    Long  Term Goals Exhibits compliance with exercise, home  and travel O2 prescription;Verbalizes importance of monitoring SPO2 with pulse oximeter and return demonstration;Maintenance of O2 saturations>88%;Exhibits proper breathing techniques, such as pursed lip breathing or other method taught during program session;Compliance with respiratory medication;Demonstrates proper use of MDI's    Goals/Expected Outcomes Compliance and understanding of oxygen saturation monitoring and breathing techniques to decrease shortness of breath.             Initial Exercise Prescription:  Initial Exercise Prescription - 01/06/22 1200       Date of Initial Exercise RX and Referring Provider   Date 01/06/22    Referring Provider Glenford Peers   Expected Discharge Date 03/13/22      Recumbant Bike   Level 2    Minutes 15    METs 2      Track   Minutes 15    METs 2.57      Prescription Details   Frequency (times per week) 2    Duration Progress to 30 minutes of continuous aerobic without signs/symptoms of physical distress      Intensity   THRR 40-80% of Max Heartrate 55-110    Ratings of Perceived Exertion 11-13    Perceived Dyspnea 0-4      Progression   Progression Continue to progress workloads to maintain intensity without signs/symptoms of physical distress.      Resistance Training   Training Prescription Yes    Weight red bands    Reps 10-15             Perform Capillary Blood Glucose checks as needed.  Exercise Prescription Changes:   Exercise Prescription Changes     Row Name 01/21/22 1100 02/04/22 1200 02/18/22 1200 02/27/22 1200 03/04/22 1200     Response to Exercise   Blood Pressure (Admit) 134/60 122/56 126/58 -- 122/58   Blood Pressure (Exercise) 130/50 110/50 120/56 -- 130/40    Blood Pressure (Exit) 118/52 122/62 102/52 -- 118/58   Heart Rate (Admit) 83 bpm 81 bpm 83 bpm -- 102 bpm   Heart Rate (Exercise) 104 bpm 106 bpm 108 bpm -- 91 bpm   Heart Rate (Exit) 86  bpm 86 bpm 83 bpm -- 83 bpm   Oxygen Saturation (Admit) 100 % 98 % 98 % -- 97 %   Oxygen Saturation (Exercise) 95 % 95 % 93 % -- 92 %   Oxygen Saturation (Exit) 100 % 96 % 93 % -- 97 %   Rating of Perceived Exertion (Exercise) _0 -- 9   Perceived Dyspnea (Exercise) 0 1 1 -- 1   Duration Progress to 30 minutes of  aerobic without signs/symptoms of physical distress Continue with 30 min of aerobic exercise without signs/symptoms of physical distress. Continue with 30 min of aerobic exercise without signs/symptoms of physical distress. -- Continue with 30 min of aerobic exercise without signs/symptoms of physical distress.   Intensity Other (comment)  40-80% of HRR THRR unchanged THRR unchanged -- THRR unchanged     Progression   Progression Continue to progress workloads to maintain intensity without signs/symptoms of physical distress. Continue to progress workloads to maintain intensity without signs/symptoms of physical distress. Continue to progress workloads to maintain intensity without signs/symptoms of physical distress. -- Continue to progress workloads to maintain intensity without signs/symptoms of physical distress.     Resistance Training   Training Prescription Yes Yes Yes -- Yes   Weight Red bands Red Bands Red Bands -- Red Bands   Reps 10-15 10-15 10-15 -- 10-15   Time 10 Minutes 10 Minutes 10 Minutes -- 10 Minutes     Recumbant Bike   Level 2 2 2.5 -- 2.5   Minutes _1 -- 15   METs 1.5 1.8 2.4 -- 2.2     Track   Laps _2 -- 13   Minutes _3 -- 15   METs -- -- 2.74 -- --     Home Exercise Plan   Plans to continue exercise at -- -- -- Home (comment) --   Frequency -- -- -- Add 1 additional day to program exercise sessions. --   Initial Home Exercises Provided --  -- -- 02/27/22 --            Exercise Comments:   Exercise Comments     Row Name 01/16/22 1528 02/27/22 1221         Exercise Comments Pt completed first day of exercise. Tangelia exercised for 15 min on the track and recumbent bike. She averaged 2.39 METs on the track and 1.7 METs at level 1 on the recumbent bike. Discussed METs and how to increase METs with Enid Derry. She performed the warmup and cooldown standing without limitations. Completed home exercise plan. Letoya is exercising at home. She uses her stationary cycling maching 1 non-rehab day/wk for 15-30 min/day. I encouraged Tamakia to increase her frequency to 2 non-rehab days/wk for 30 min/day. I discussed the purpose of rehab and my goal for her when she graduates the program. Marcedes showed understanding of my recommendations. Marji seems somewhat motivated to exercise at home. She enjoys coming to rehab as she has noticed a difference in her functional capacity.               Exercise Goals and Review:   Exercise Goals     Row Name 01/06/22 0240 01/09/22 1605 02/04/22 1611 03/03/22 1147       Exercise Goals   Increase Physical Activity Yes Yes Yes Yes    Intervention Provide advice, education, support and counseling about physical activity/exercise needs.;Develop an individualized exercise prescription for aerobic and resistive training based on initial evaluation findings,  risk stratification, comorbidities and participant's personal goals. Provide advice, education, support and counseling about physical activity/exercise needs.;Develop an individualized exercise prescription for aerobic and resistive training based on initial evaluation findings, risk stratification, comorbidities and participant's personal goals. Provide advice, education, support and counseling about physical activity/exercise needs.;Develop an individualized exercise prescription for aerobic and resistive training based on initial evaluation  findings, risk stratification, comorbidities and participant's personal goals. Provide advice, education, support and counseling about physical activity/exercise needs.;Develop an individualized exercise prescription for aerobic and resistive training based on initial evaluation findings, risk stratification, comorbidities and participant's personal goals.    Expected Outcomes Short Term: Attend rehab on a regular basis to increase amount of physical activity.;Long Term: Add in home exercise to make exercise part of routine and to increase amount of physical activity.;Long Term: Exercising regularly at least 3-5 days a week. Short Term: Attend rehab on a regular basis to increase amount of physical activity.;Long Term: Add in home exercise to make exercise part of routine and to increase amount of physical activity.;Long Term: Exercising regularly at least 3-5 days a week. Short Term: Attend rehab on a regular basis to increase amount of physical activity.;Long Term: Add in home exercise to make exercise part of routine and to increase amount of physical activity.;Long Term: Exercising regularly at least 3-5 days a week. Short Term: Attend rehab on a regular basis to increase amount of physical activity.;Long Term: Add in home exercise to make exercise part of routine and to increase amount of physical activity.;Long Term: Exercising regularly at least 3-5 days a week.    Increase Strength and Stamina Yes Yes Yes Yes    Intervention Provide advice, education, support and counseling about physical activity/exercise needs.;Develop an individualized exercise prescription for aerobic and resistive training based on initial evaluation findings, risk stratification, comorbidities and participant's personal goals. Provide advice, education, support and counseling about physical activity/exercise needs.;Develop an individualized exercise prescription for aerobic and resistive training based on initial evaluation  findings, risk stratification, comorbidities and participant's personal goals. Provide advice, education, support and counseling about physical activity/exercise needs.;Develop an individualized exercise prescription for aerobic and resistive training based on initial evaluation findings, risk stratification, comorbidities and participant's personal goals. Provide advice, education, support and counseling about physical activity/exercise needs.;Develop an individualized exercise prescription for aerobic and resistive training based on initial evaluation findings, risk stratification, comorbidities and participant's personal goals.    Expected Outcomes Short Term: Increase workloads from initial exercise prescription for resistance, speed, and METs.;Short Term: Perform resistance training exercises routinely during rehab and add in resistance training at home;Long Term: Improve cardiorespiratory fitness, muscular endurance and strength as measured by increased METs and functional capacity (6MWT) Short Term: Perform resistance training exercises routinely during rehab and add in resistance training at home;Short Term: Increase workloads from initial exercise prescription for resistance, speed, and METs.;Long Term: Improve cardiorespiratory fitness, muscular endurance and strength as measured by increased METs and functional capacity (6MWT) Short Term: Perform resistance training exercises routinely during rehab and add in resistance training at home;Short Term: Increase workloads from initial exercise prescription for resistance, speed, and METs.;Long Term: Improve cardiorespiratory fitness, muscular endurance and strength as measured by increased METs and functional capacity (6MWT) Short Term: Perform resistance training exercises routinely during rehab and add in resistance training at home;Short Term: Increase workloads from initial exercise prescription for resistance, speed, and METs.;Long Term: Improve  cardiorespiratory fitness, muscular endurance and strength as measured by increased METs and functional capacity (6MWT)    Able  to understand and use rate of perceived exertion (RPE) scale Yes Yes Yes Yes    Intervention Provide education and explanation on how to use RPE scale Provide education and explanation on how to use RPE scale Provide education and explanation on how to use RPE scale Provide education and explanation on how to use RPE scale    Expected Outcomes Short Term: Able to use RPE daily in rehab to express subjective intensity level;Long Term:  Able to use RPE to guide intensity level when exercising independently Short Term: Able to use RPE daily in rehab to express subjective intensity level;Long Term:  Able to use RPE to guide intensity level when exercising independently Short Term: Able to use RPE daily in rehab to express subjective intensity level;Long Term:  Able to use RPE to guide intensity level when exercising independently Short Term: Able to use RPE daily in rehab to express subjective intensity level;Long Term:  Able to use RPE to guide intensity level when exercising independently    Able to understand and use Dyspnea scale Yes Yes Yes Yes    Intervention Provide education and explanation on how to use Dyspnea scale Provide education and explanation on how to use Dyspnea scale Provide education and explanation on how to use Dyspnea scale Provide education and explanation on how to use Dyspnea scale    Expected Outcomes Short Term: Able to use Dyspnea scale daily in rehab to express subjective sense of shortness of breath during exertion;Long Term: Able to use Dyspnea scale to guide intensity level when exercising independently Short Term: Able to use Dyspnea scale daily in rehab to express subjective sense of shortness of breath during exertion;Long Term: Able to use Dyspnea scale to guide intensity level when exercising independently Short Term: Able to use Dyspnea scale daily  in rehab to express subjective sense of shortness of breath during exertion;Long Term: Able to use Dyspnea scale to guide intensity level when exercising independently Short Term: Able to use Dyspnea scale daily in rehab to express subjective sense of shortness of breath during exertion;Long Term: Able to use Dyspnea scale to guide intensity level when exercising independently    Knowledge and understanding of Target Heart Rate Range (THRR) Yes Yes Yes Yes    Intervention Provide education and explanation of THRR including how the numbers were predicted and where they are located for reference Provide education and explanation of THRR including how the numbers were predicted and where they are located for reference Provide education and explanation of THRR including how the numbers were predicted and where they are located for reference Provide education and explanation of THRR including how the numbers were predicted and where they are located for reference    Expected Outcomes Short Term: Able to state/look up THRR;Long Term: Able to use THRR to govern intensity when exercising independently;Short Term: Able to use daily as guideline for intensity in rehab Short Term: Able to state/look up THRR;Long Term: Able to use THRR to govern intensity when exercising independently;Short Term: Able to use daily as guideline for intensity in rehab Short Term: Able to state/look up THRR;Long Term: Able to use THRR to govern intensity when exercising independently;Short Term: Able to use daily as guideline for intensity in rehab Short Term: Able to state/look up THRR;Long Term: Able to use THRR to govern intensity when exercising independently;Short Term: Able to use daily as guideline for intensity in rehab    Understanding of Exercise Prescription Yes Yes Yes Yes    Intervention Provide  education, explanation, and written materials on patient's individual exercise prescription Provide education, explanation, and written  materials on patient's individual exercise prescription Provide education, explanation, and written materials on patient's individual exercise prescription Provide education, explanation, and written materials on patient's individual exercise prescription    Expected Outcomes Short Term: Able to explain program exercise prescription;Long Term: Able to explain home exercise prescription to exercise independently Short Term: Able to explain program exercise prescription;Long Term: Able to explain home exercise prescription to exercise independently Short Term: Able to explain program exercise prescription;Long Term: Able to explain home exercise prescription to exercise independently Short Term: Able to explain program exercise prescription;Long Term: Able to explain home exercise prescription to exercise independently             Exercise Goals Re-Evaluation :  Exercise Goals Re-Evaluation     Row Name 01/09/22 1606 02/04/22 1611 03/03/22 1148         Exercise Goal Re-Evaluation   Exercise Goals Review Increase Physical Activity;Increase Strength and Stamina;Able to understand and use rate of perceived exertion (RPE) scale;Able to understand and use Dyspnea scale;Knowledge and understanding of Target Heart Rate Range (THRR);Understanding of Exercise Prescription Increase Physical Activity;Increase Strength and Stamina;Able to understand and use rate of perceived exertion (RPE) scale;Able to understand and use Dyspnea scale;Knowledge and understanding of Target Heart Rate Range (THRR);Understanding of Exercise Prescription Increase Physical Activity;Increase Strength and Stamina;Able to understand and use rate of perceived exertion (RPE) scale;Able to understand and use Dyspnea scale;Knowledge and understanding of Target Heart Rate Range (THRR);Understanding of Exercise Prescription     Comments Angee is scheduled to start exercise next week. Will monitor and progress as able. Pt has completed 6  exercise sessions. Pt exercises for 15 min on the track and recumbent bike. Pt averages 3.55 METS on the trrack and 1.8 METs at level 2 on the recumbent bike. Yehudis has increased her number laps within 15 min on the track and her workload on the recumbent bike. She has tolerated progressions well. Dorean has missed a few classed due to doctor's appointment. She performs the warmup and cooldown standing without limiations. Will continue to monitor and progress as able. Pt has completed 11 exercise sessions. Pt exercises for 15 min on the track and recumbent bike. Pt averages 2.51 METS on the trrack and 2.2 METs at level 2.5 on the recumbent bike. Tiffanye has increased her workload on the recumbent bike. She has tolerated this well. Genna has missed a few classed due to doctor's appointments. This has made it difficult for me to progress her. She performs the warmup and cooldown standing without limitations. Will continue to monitor.     Expected Outcomes Through exercise at rehab and home, the patient will decrease shortness of breath with daily activities and feel confident in carrying out an exercise regimen at home. Through exercise at rehab and home, the patient will decrease shortness of breath with daily activities and feel confident in carrying out an exercise regimen at home. Through exercise at rehab and home, the patient will decrease shortness of breath with daily activities and feel confident in carrying out an exercise regimen at home.              Discharge Exercise Prescription (Final Exercise Prescription Changes):  Exercise Prescription Changes - 03/04/22 1200       Response to Exercise   Blood Pressure (Admit) 122/58    Blood Pressure (Exercise) 130/40    Blood Pressure (Exit) 118/58  Heart Rate (Admit) 102 bpm    Heart Rate (Exercise) 91 bpm    Heart Rate (Exit) 83 bpm    Oxygen Saturation (Admit) 97 %    Oxygen Saturation (Exercise) 92 %    Oxygen Saturation (Exit) 97  %    Rating of Perceived Exertion (Exercise) 9    Perceived Dyspnea (Exercise) 1    Duration Continue with 30 min of aerobic exercise without signs/symptoms of physical distress.    Intensity THRR unchanged      Progression   Progression Continue to progress workloads to maintain intensity without signs/symptoms of physical distress.      Resistance Training   Training Prescription Yes    Weight Red Bands    Reps 10-15    Time 10 Minutes      Recumbant Bike   Level 2.5    Minutes 15    METs 2.2      Track   Laps 13    Minutes 15             Nutrition:  Target Goals: Understanding of nutrition guidelines, daily intake of sodium <1560m, cholesterol <2020m calories 30% from fat and 7% or less from saturated fats, daily to have 5 or more servings of fruits and vegetables.  Biometrics:   Post Biometrics - 01/06/22 0902        Post  Biometrics   Grip Strength 12 kg   Lt hand            Nutrition Therapy Plan and Nutrition Goals:  Nutrition Therapy & Goals - 02/25/22 1220       Nutrition Therapy   Diet Heart Healthy Diet    Protein (specify units) 63-78g/kg ideal body weight      Personal Nutrition Goals   Nutrition Goal Patient will continue to follow a heart healthy diet to incorporate lean protein/plant protein, fruits/vegetables, whole grains, nuts/seeds.    Personal Goal #2 Patient to identify food quantities and eating frequency to support weight gain/weight maintenance of healthy BMI.   Reviewed supplements options to aid with increasing calorie/protein intake. Encouraged >1 supplement daily. She continues to be inconsistent with supplements but continues to eat 3 meals per day and report good appetite.   Comments Patient is up 2.86# since starting with our program (start weight 102.96#, today's weight 105.82#). She continues to eat 3 regular meals and is incorporating pre-made protein shake or nutrition supplement into a milkshake to aid with meeting  caloric needs.      Intervention Plan   Intervention Prescribe, educate and counsel regarding individualized specific dietary modifications aiming towards targeted core components such as weight, hypertension, lipid management, diabetes, heart failure and other comorbidities.;Nutrition handout(s) given to patient.    Expected Outcomes Short Term Goal: A plan has been developed with personal nutrition goals set during dietitian appointment.;Long Term Goal: Adherence to prescribed nutrition plan.             Nutrition Assessments:  Nutrition Assessments - 03/06/22 1433       Rate Your Plate Scores   Post Score 61            MEDIFICTS Score Key: ?70 Need to make dietary changes  40-70 Heart Healthy Diet ? 40 Therapeutic Level Cholesterol Diet  Flowsheet Row PULMONARY REHAB OTHER RESPIRATORY [facility] Total Score on Discharge 61      Picture Your Plate Scores: <4<95nhealthy dietary pattern with  much room for improvement. 41-50 Dietary pattern unlikely to meet recommendations for good health and room for improvement. 51-60 More healthful dietary pattern, with some room for improvement.  >60 Healthy dietary pattern, although there may be some specific behaviors that could be improved.    Nutrition Goals Re-Evaluation:   Nutrition Goals Discharge (Final Nutrition Goals Re-Evaluation):   Psychosocial: Target Goals: Acknowledge presence or absence of significant depression and/or stress, maximize coping skills, provide positive support system. Participant is able to verbalize types and ability to use techniques and skills needed for reducing stress and depression.  Initial Review & Psychosocial Screening:  Initial Psych Review & Screening - 01/06/22 0918       Initial Review   Current issues with None Identified      Family Dynamics   Good Support System? Yes    Comments husband and children       Barriers   Psychosocial barriers to participate in program There are no identifiable barriers or psychosocial needs.      Screening Interventions   Interventions Encouraged to exercise             Quality of Life Scores:  Scores of 19 and below usually indicate a poorer quality of life in these areas.  A difference of  2-3 points is a clinically meaningful difference.  A difference of 2-3 points in the total score of the Quality of Life Index has been associated with significant improvement in overall quality of life, self-image, physical symptoms, and general health in studies assessing change in quality of life.  PHQ-9: Review Flowsheet        01/06/2022 01/10/2021 10/28/2019 02/01/2018 09/03/2017  Depression screen PHQ 2/9  Decreased Interest 0 0 0 0 0  Down, Depressed, Hopeless 0 0 0 1 0  PHQ - 2 Score 0 0 0 1 0  Altered sleeping 0   0   Tired, decreased energy 0   1   Change in appetite 0   1   Feeling bad or failure about yourself  1   1   Trouble concentrating 0   0   Moving slowly or fidgety/restless 0   0   Suicidal thoughts 0   0   PHQ-9 Score 1   4   Difficult doing work/chores Not difficult at all   Not difficult at all          Interpretation of Total Score  Total Score Depression Severity:  1-4 = Minimal depression, 5-9 = Mild depression, 10-14 = Moderate depression, 15-19 = Moderately severe depression, 20-27 = Severe depression   Psychosocial Evaluation and Intervention:  Psychosocial Evaluation - 01/06/22 0919       Psychosocial Evaluation & Interventions   Interventions Encouraged to exercise with the program and follow exercise prescription    Expected Outcomes For Khyler to participate in Pulmonary Rehab    Continue Psychosocial Services  Follow up required by staff             Psychosocial Re-Evaluation:  Psychosocial Re-Evaluation     [place] Name 01/13/22 1419 02/05/22 1545 02/06/22 1557 03/06/22 1643       Psychosocial Re-Evaluation    Current issues with None Identified None Identified None Identified (P)  None Identified    Comments No change in psychosocial evaluation since orientation, no concerns. Rockie is participating in [place] without any psychosocial barriers or concerns. She is progressing well in her exercise and seems to be enjoyinmg the class. She is connecting  to others in her class. Shenice  has been able to attend the PR program without any psychosocial barriers or concerns. She has been progressing well with her exercsie. (P)  Marien has been participating in the [place] program without any psychosocial barriers or concerns. She has engaged with her classmates and seems to be enjoying the program.    Expected Outcomes For Leydi to continue to be free of psychosocial concerns while participating in pulmonary rehab For her to continue to participate in the PR without any psychosoical issues. For her to continue to participate in PR without any psychosocial issues. (P)  For her to continue to participate in the PR without any psychosocial issues.    Interventions Encouraged to attend Pulmonary Rehabilitation for the exercise Encouraged to attend Pulmonary Rehabilitation for the exercise Encouraged to attend Pulmonary Rehabilitation for the exercise (P)  Encouraged to attend Pulmonary Rehabilitation for the exercise    Continue Psychosocial Services  No Follow up required Follow up required by staff Follow up required by staff (P)  Follow up required by staff             Psychosocial Discharge (Final Psychosocial Re-Evaluation):  Psychosocial Re-Evaluation - 03/06/22 1643       Psychosocial Re-Evaluation   Current issues with None Identified    Comments Deana has been participating in the PR program without any psychosocial barriers or concerns. She has engaged with her classmates and seems to be enjoying the program.    Expected Outcomes For her to continue to participate in the PR without any psychosocial issues.     Interventions Encouraged to attend Pulmonary Rehabilitation for the exercise    Continue Psychosocial Services  Follow up required by staff             Education: Education Goals: Education classes will be provided on a weekly basis, covering required topics. Participant will state understanding/return demonstration of topics presented.  Learning Barriers/Preferences:  Learning Barriers/Preferences - 01/06/22 0919       Learning Barriers/Preferences   Learning Barriers Sight   wears reading glasses   Learning Preferences Audio;Group Instruction;Individual Instruction;Pictoral;Skilled Demonstration;Verbal Instruction;Video;Written Material             Education Topics: Risk Factor Reduction:  -Group instruction that is supported by a PowerPoint presentation. Instructor discusses the definition of a risk factor, different risk factors for pulmonary disease, and how the heart and lungs work together.   Flowsheet Row PULMONARY REHAB OTHER RESPIRATORY from 01/30/2022 in [place]  Date 01/23/22  Educator Donnetta Simpers  [h/o]  Instruction Review Code 1- Verbalizes Understanding       Nutrition for Pulmonary Patient:  -Group instruction provided by PowerPoint slides, verbal discussion, and written materials to support subject matter. The instructor gives an explanation and review of healthy diet recommendations, which includes a discussion on weight management, recommendations for fruit and vegetable consumption, as well as protein, fluid, caffeine, fiber, sodium, sugar, and alcohol. Tips for eating when patients are short of breath are discussed. Flowsheet Row PULMONARY REHAB OTHER RESPIRATORY from 03/06/2022 in [place]  Date 02/27/22  Educator Sam  Instruction Review Code 1- Verbalizes Understanding       Pursed Lip Breathing:  -Group instruction that is supported by demonstration and informational handouts.  Instructor discusses the benefits of pursed lip and diaphragmatic breathing and detailed demonstration on how to preform both.   Flowsheet Row PULMONARY REHAB OTHER RESPIRATORY from 01/16/2022 in [facility]  [place]  Date 01/16/22  Educator Donnetta Simpers  [Handout]       Oxygen Safety:  -Group instruction provided by PowerPoint, verbal discussion, and written material to support subject matter. There is an overview of "What is Oxygen" and "Why do we need it".  Instructor also reviews how to create a safe environment for oxygen use, the importance of using oxygen as prescribed, and the risks of noncompliance. There is a brief discussion on traveling with oxygen and resources the patient may utilize.   Oxygen Equipment:  -Group instruction provided by [facility] Staff utilizing handouts, written materials, and equipment demonstrations.   Signs and Symptoms:  -Group instruction provided by written material and verbal discussion to support subject matter. Warning signs and symptoms of infection, stroke, and heart attack are reviewed and when to call the physician/911 reinforced. Tips for preventing the spread of infection discussed.   Advanced Directives:  -Group instruction provided by verbal instruction and written material to support subject matter. Instructor reviews Advanced Directive laws and proper instruction for filling out document.   Pulmonary Video:  -Group video education that reviews the importance of medication and oxygen compliance, exercise, good nutrition, pulmonary hygiene, and pursed lip and diaphragmatic breathing for the pulmonary patient.   Exercise for the Pulmonary Patient:  -Group instruction that is supported by a PowerPoint presentation. Instructor discusses benefits of exercise, core components of exercise, frequency, duration, and intensity of an exercise routine, importance of utilizing pulse oximetry during exercise, safety while exercising,  and options of places to exercise outside of rehab.     Pulmonary Medications:  -Verbally interactive group education provided by instructor with focus on inhaled medications and proper administration.   Anatomy and Physiology of the Respiratory System and Intimacy:  -Group instruction provided by PowerPoint, verbal discussion, and written material to support subject matter. Instructor reviews respiratory cycle and anatomical components of the respiratory system and their functions. Instructor also reviews differences in obstructive and restrictive respiratory diseases with examples of each. Intimacy, Sex, and Sexuality differences are reviewed with a discussion on how relationships can change when diagnosed with pulmonary disease. Common sexual concerns are reviewed.   MD DAY -A group question and answer session with a medical doctor that allows participants to ask questions that relate to their pulmonary disease state.   OTHER EDUCATION -Group or individual verbal, written, or video instructions that support the educational goals of the pulmonary rehab program. [place] from 03/06/2022 in [place]  Date 03/06/22  [facility] Levels]  Educator Donnetta Simpers  Instruction Review Code 1- Verbalizes Understanding       Holiday Eating Survival Tips:  -Group instruction provided by [employer], verbal discussion, and written materials to support subject matter. The instructor gives patients tips, tricks, and techniques to help them not only survive but enjoy the holidays despite the onslaught of food that accompanies the holidays.   Knowledge Questionnaire Score:  Knowledge Questionnaire Score - 03/06/22 1212       Knowledge Questionnaire Score   Post Score 15/18             Core Components/Risk Factors/Patient Goals at Admission:  Personal Goals and Risk Factors at Admission - 01/06/22 0922       Core  Components/Risk Factors/Patient Goals on Admission    Weight Management Weight Gain    Improve shortness of breath with ADL's Yes    Intervention Provide education, individualized exercise plan and  daily activity instruction to help decrease symptoms of SOB with activities of daily living.    Expected Outcomes Short Term: Improve cardiorespiratory fitness to achieve a reduction of symptoms when performing ADLs;Long Term: Be able to perform more ADLs without symptoms or delay the onset of symptoms             Core Components/Risk Factors/Patient Goals Review:   Goals and Risk Factor Review     Row Name 01/13/22 1421 02/05/22 1557 03/06/22 1701         Core Components/Risk Factors/Patient Goals Review   Personal Goals Review Develop more efficient breathing techniques such as purse lipped breathing and diaphragmatic breathing and practicing self-pacing with activity.;Increase knowledge of respiratory medications and ability to use respiratory devices properly.;Improve shortness of breath with ADL's Increase knowledge of respiratory medications and ability to use respiratory devices properly.;Improve shortness of breath with ADL's;Develop more efficient breathing techniques such as purse lipped breathing and diaphragmatic breathing and practicing self-pacing with activity. Improve shortness of breath with ADL's;Increase knowledge of respiratory medications and ability to use respiratory devices properly.;Develop more efficient breathing techniques such as purse lipped breathing and diaphragmatic breathing and practicing self-pacing with activity.;Weight Management/Obesity     Review Launi will begin exercising in pulmonary rehab 01/16/2022, in 3 days, so she has not attained any goals.  Will re-evaluate in 30 days. Mckenzye is progressing well with her exercise.She reports only mild SOB with exercise. She seems to be enjoying the class and is connecting with her classmates. Myriah has had a slight  weight gain since starting the program. [facility] has made good progress on the track and the recumbent bike with increasing her laps and mets on the bike.She is exercising on room air with oxygen saturations 92-96%. She reports no to mild SOB with exercise.     Expected Outcomes See admission goals. For her to continue to progress with her exercise, improving  how to manage her SOB with ADL and Better breathing techniques. see admission  goals.              Core Components/Risk Factors/Patient Goals at Discharge (Final Review):   Goals and Risk Factor Review - 03/06/22 1701       Core Components/Risk Factors/Patient Goals Review   Personal Goals Review Improve shortness of breath with ADL's;Increase knowledge of respiratory medications and ability to use respiratory devices properly.;Develop more efficient breathing techniques such as purse lipped breathing and diaphragmatic breathing and practicing self-pacing with activity.;Weight Management/Obesity    Review Jennipher has had a slight weight gain since starting the program. [facility] has made good progress on the track and the recumbent bike with increasing her laps and mets on the bike.She is exercising on room air with oxygen saturations 92-96%. She reports no to mild SOB with exercise.    Expected Outcomes see admission  goals.             ITP Comments: Pt is making expected progress toward Pulmonary Rehab goals after completing 13 sessions. Recommend continued exercise, life style modification, education, and utilization of breathing techniques to increase stamina and strength, while also decreasing shortness of breath with exertion.  Dr. Rodman Pickle is Medical Director for Pulmonary Rehab at [facility].

## 2022-03-13 ENCOUNTER — Encounter (HOSPITAL_COMMUNITY)
Admission: RE | Admit: 2022-03-13 | Discharge: 2022-03-13 | Disposition: A | Discharge status: Home / Self Care | Payer: Medicare PPO | Source: Ambulatory Visit | Attending: Pulmonary Disease | Admitting: Pulmonary Disease

## 2022-03-13 DIAGNOSIS — J849 Interstitial pulmonary disease, unspecified: Secondary | ICD-10-CM

## 2022-03-13 DIAGNOSIS — Z5189 Encounter for other specified aftercare: Secondary | ICD-10-CM | POA: Diagnosis not present

## 2022-03-13 DIAGNOSIS — N1832 Chronic kidney disease, stage 3b: Secondary | ICD-10-CM | POA: Diagnosis not present

## 2022-03-13 NOTE — Progress Notes (Signed)
Discharge Progress Report  Patient Details  Name: Paula Ward MRN: [id number] Date of Birth: June 18, 1936 Referring Provider:   April Manson Pulmonary Rehab Walk Test from 01/06/2022 in [place]  Referring Provider Hunter  [[place]]        Number of Visits: 14  Reason for Discharge:  Patient has met program and personal goals.  Smoking History:  Social History   Tobacco Use  Smoking Status Never  Smokeless Tobacco Never    Diagnosis:  Interstitial lung disease ([place])  ADL UCSD:  Pulmonary Assessment Scores     Row Name 01/06/22 0930 03/06/22 1212       ADL UCSD   ADL Phase Entry Exit    SOB Score total 11 27      CAT Score   CAT Score 7 14      mMRC Score   mMRC Score 1 --             Initial Exercise Prescription:  Initial Exercise Prescription - 01/06/22 1200       Date of Initial Exercise RX and Referring Provider   Date 01/06/22    Referring Provider Glenford Peers   Expected Discharge Date 03/13/22      Recumbant Bike   Level 2    Minutes 15    METs 2      Track   Minutes 15    METs 2.57      Prescription Details   Frequency (times per week) 2    Duration Progress to 30 minutes of continuous aerobic without signs/symptoms of physical distress      Intensity   THRR 40-80% of Max Heartrate 55-110    Ratings of Perceived Exertion 11-13    Perceived Dyspnea 0-4      Progression   Progression Continue to progress workloads to maintain intensity without signs/symptoms of physical distress.      Resistance Training   Training Prescription Yes    Weight red bands    Reps 10-15             Discharge Exercise Prescription (Final Exercise Prescription Changes):  Exercise Prescription Changes - 03/04/22 1200       Response to Exercise   Blood Pressure (Admit) 122/58    Blood Pressure (Exercise) 130/40    Blood Pressure (Exit) 118/58    Heart Rate (Admit) 102 bpm    Heart Rate  (Exercise) 91 bpm    Heart Rate (Exit) 83 bpm    Oxygen Saturation (Admit) 97 %    Oxygen Saturation (Exercise) 92 %    Oxygen Saturation (Exit) 97 %    Rating of Perceived Exertion (Exercise) 9    Perceived Dyspnea (Exercise) 1    Duration Continue with 30 min of aerobic exercise without signs/symptoms of physical distress.    Intensity THRR unchanged      Progression   Progression Continue to progress workloads to maintain intensity without signs/symptoms of physical distress.      Resistance Training   Training Prescription Yes    Weight Red Bands    Reps 10-15    Time 10 Minutes      Recumbant Bike   Level 2.5    Minutes 15    METs 2.2      Track   Laps 13    Minutes 15             Functional Capacity:  6 Minute Walk  [place] Name 01/06/22 1208 03/13/22 1515       6 Minute Walk   Phase Initial Discharge    Distance 1230 feet 1470 feet    Distance % Change -- 10 %    Distance Feet Change -- 0.68 ft    Walk Time 6 minutes 6 minutes    # of Rest Breaks 0 0    MPH 2.39 2.78    METS 2.57 3.21    RPE 9.5 9    Perceived Dyspnea  0.5 1    VO2 Peak 8.98 11.25    Symptoms No No    Resting HR 71 bpm 89 bpm    Resting BP 100/58 128/60    Resting Oxygen Saturation  99 % 94 %    Exercise Oxygen Saturation  during 6 min walk 89 % 88 %    Max Ex. HR 118 bpm 111 bpm    Max Ex. BP 116/60 162/60    2 Minute Post BP 108/50 142/50      Interval HR   1 Minute HR 111 97    2 Minute HR 111 107    3 Minute HR 111 108    4 Minute HR 113 111    5 Minute HR 112 106    6 Minute HR 118 108    2 Minute Post HR 101 83    Interval Heart Rate? Yes Yes      Interval Oxygen   Interval Oxygen? Yes Yes    Baseline Oxygen Saturation % 99 % 94 %    1 Minute Oxygen Saturation % 94 % 93 %    1 Minute Liters of Oxygen 0 L 0 L    2 Minute Oxygen Saturation % 89 % 90 %    2 Minute Liters of Oxygen 0 L 0 L    3 Minute Oxygen Saturation % 89 % 89 %    3 Minute Liters of Oxygen 0 L  0 L    4 Minute Oxygen Saturation % 93 % 89 %    4 Minute Liters of Oxygen 0 L 0 L    5 Minute Oxygen Saturation % 91 % 89 %    5 Minute Liters of Oxygen 0 L 0 L    6 Minute Oxygen Saturation % 92 % 88 %    6 Minute Liters of Oxygen 0 L 0 L    2 Minute Post Oxygen Saturation % 93 % 96 %    2 Minute Post Liters of Oxygen 0 L 0 L             Psychological, QOL, Others - Outcomes: PHQ 2/9:    03/13/2022    3:22 PM 01/06/2022    9:16 AM 01/10/2021    9:21 AM 10/28/2019    2:03 PM 02/01/2018    2:32 PM  Depression screen PHQ 2/9  Decreased Interest 0 0 0 0 0  Down, Depressed, Hopeless 0 0 0 0 1  PHQ - 2 Score 0 0 0 0 1  Altered sleeping 0 0   0  Tired, decreased energy 0 0   1  Change in appetite 0 0   1  Feeling bad or failure about yourself  0 1   1  Trouble concentrating 0 0   0  Moving slowly or fidgety/restless 0 0   0  Suicidal thoughts 0 0   0  PHQ-9 Score 0 1   4  Difficult doing work/chores  Not difficult at all   Not difficult at all    Quality of Life:   Personal Goals: Goals established at orientation with interventions provided to work toward goal.  Personal Goals and Risk Factors at Admission - 01/06/22 0922       Core Components/Risk Factors/Patient Goals on Admission    Weight Management Weight Gain    Improve shortness of breath with ADL's Yes    Intervention Provide education, individualized exercise plan and daily activity instruction to help decrease symptoms of SOB with activities of daily living.    Expected Outcomes Short Term: Improve cardiorespiratory fitness to achieve a reduction of symptoms when performing ADLs;Long Term: Be able to perform more ADLs without symptoms or delay the onset of symptoms              Personal Goals Discharge:  Goals and Risk Factor Review     Row Name 01/13/22 1421 02/05/22 1557 03/06/22 1701         Core Components/Risk Factors/Patient Goals Review   Personal Goals Review Develop more efficient breathing  techniques such as purse lipped breathing and diaphragmatic breathing and practicing self-pacing with activity.;Increase knowledge of respiratory medications and ability to use respiratory devices properly.;Improve shortness of breath with ADL's Increase knowledge of respiratory medications and ability to use respiratory devices properly.;Improve shortness of breath with ADL's;Develop more efficient breathing techniques such as purse lipped breathing and diaphragmatic breathing and practicing self-pacing with activity. Improve shortness of breath with ADL's;Increase knowledge of respiratory medications and ability to use respiratory devices properly.;Develop more efficient breathing techniques such as purse lipped breathing and diaphragmatic breathing and practicing self-pacing with activity.;Weight Management/Obesity     Review Indya will begin exercising in pulmonary rehab 01/16/2022, in 3 days, so she has not attained any goals.  Will re-evaluate in 30 days. Lynette is progressing well with her exercise.She reports only mild SOB with exercise. She seems to be enjoying the class and is connecting with her classmates. Danny has had a slight weight gain since starting the program. [facility] has made good progress on the track and the recumbent bike with increasing her laps and mets on the bike.She is exercising on room air with oxygen saturations 92-96%. She reports no to mild SOB with exercise.     Expected Outcomes See admission goals. For her to continue to progress with her exercise, improving  how to manage her SOB with ADL and Better breathing techniques. see admission  goals.              Exercise Goals and Review:  Exercise Goals     Row Name 01/06/22 5732 01/09/22 1605 02/04/22 1611 03/03/22 1147       Exercise Goals   Increase Physical Activity Yes Yes Yes Yes    Intervention Provide advice, education, support and counseling about physical activity/exercise needs.;Develop an individualized  exercise prescription for aerobic and resistive training based on initial evaluation findings, risk stratification, comorbidities and participant's personal goals. Provide advice, education, support and counseling about physical activity/exercise needs.;Develop an individualized exercise prescription for aerobic and resistive training based on initial evaluation findings, risk stratification, comorbidities and participant's personal goals. Provide advice, education, support and counseling about physical activity/exercise needs.;Develop an individualized exercise prescription for aerobic and resistive training based on initial evaluation findings, risk stratification, comorbidities and participant's personal goals. Provide advice, education, support and counseling about physical activity/exercise needs.;Develop an individualized exercise prescription for aerobic and resistive training based on initial evaluation findings, risk stratification, comorbidities  and participant's personal goals.    Expected Outcomes Short Term: Attend rehab on a regular basis to increase amount of physical activity.;Long Term: Add in home exercise to make exercise part of routine and to increase amount of physical activity.;Long Term: Exercising regularly at least 3-5 days a week. Short Term: Attend rehab on a regular basis to increase amount of physical activity.;Long Term: Add in home exercise to make exercise part of routine and to increase amount of physical activity.;Long Term: Exercising regularly at least 3-5 days a week. Short Term: Attend rehab on a regular basis to increase amount of physical activity.;Long Term: Add in home exercise to make exercise part of routine and to increase amount of physical activity.;Long Term: Exercising regularly at least 3-5 days a week. Short Term: Attend rehab on a regular basis to increase amount of physical activity.;Long Term: Add in home exercise to make exercise part of routine and to  increase amount of physical activity.;Long Term: Exercising regularly at least 3-5 days a week.    Increase Strength and Stamina Yes Yes Yes Yes    Intervention Provide advice, education, support and counseling about physical activity/exercise needs.;Develop an individualized exercise prescription for aerobic and resistive training based on initial evaluation findings, risk stratification, comorbidities and participant's personal goals. Provide advice, education, support and counseling about physical activity/exercise needs.;Develop an individualized exercise prescription for aerobic and resistive training based on initial evaluation findings, risk stratification, comorbidities and participant's personal goals. Provide advice, education, support and counseling about physical activity/exercise needs.;Develop an individualized exercise prescription for aerobic and resistive training based on initial evaluation findings, risk stratification, comorbidities and participant's personal goals. Provide advice, education, support and counseling about physical activity/exercise needs.;Develop an individualized exercise prescription for aerobic and resistive training based on initial evaluation findings, risk stratification, comorbidities and participant's personal goals.    Expected Outcomes Short Term: Increase workloads from initial exercise prescription for resistance, speed, and METs.;Short Term: Perform resistance training exercises routinely during rehab and add in resistance training at home;Long Term: Improve cardiorespiratory fitness, muscular endurance and strength as measured by increased METs and functional capacity (6MWT) Short Term: Perform resistance training exercises routinely during rehab and add in resistance training at home;Short Term: Increase workloads from initial exercise prescription for resistance, speed, and METs.;Long Term: Improve cardiorespiratory fitness, muscular endurance and strength as  measured by increased METs and functional capacity (6MWT) Short Term: Perform resistance training exercises routinely during rehab and add in resistance training at home;Short Term: Increase workloads from initial exercise prescription for resistance, speed, and METs.;Long Term: Improve cardiorespiratory fitness, muscular endurance and strength as measured by increased METs and functional capacity (6MWT) Short Term: Perform resistance training exercises routinely during rehab and add in resistance training at home;Short Term: Increase workloads from initial exercise prescription for resistance, speed, and METs.;Long Term: Improve cardiorespiratory fitness, muscular endurance and strength as measured by increased METs and functional capacity (6MWT)    Able to understand and use rate of perceived exertion (RPE) scale Yes Yes Yes Yes    Intervention Provide education and explanation on how to use RPE scale Provide education and explanation on how to use RPE scale Provide education and explanation on how to use RPE scale Provide education and explanation on how to use RPE scale    Expected Outcomes Short Term: Able to use RPE daily in rehab to express subjective intensity level;Long Term:  Able to use RPE to guide intensity level when exercising independently Short Term: Able to use RPE daily  in rehab to express subjective intensity level;Long Term:  Able to use RPE to guide intensity level when exercising independently Short Term: Able to use RPE daily in rehab to express subjective intensity level;Long Term:  Able to use RPE to guide intensity level when exercising independently Short Term: Able to use RPE daily in rehab to express subjective intensity level;Long Term:  Able to use RPE to guide intensity level when exercising independently    Able to understand and use Dyspnea scale Yes Yes Yes Yes    Intervention Provide education and explanation on how to use Dyspnea scale Provide education and explanation on  how to use Dyspnea scale Provide education and explanation on how to use Dyspnea scale Provide education and explanation on how to use Dyspnea scale    Expected Outcomes Short Term: Able to use Dyspnea scale daily in rehab to express subjective sense of shortness of breath during exertion;Long Term: Able to use Dyspnea scale to guide intensity level when exercising independently Short Term: Able to use Dyspnea scale daily in rehab to express subjective sense of shortness of breath during exertion;Long Term: Able to use Dyspnea scale to guide intensity level when exercising independently Short Term: Able to use Dyspnea scale daily in rehab to express subjective sense of shortness of breath during exertion;Long Term: Able to use Dyspnea scale to guide intensity level when exercising independently Short Term: Able to use Dyspnea scale daily in rehab to express subjective sense of shortness of breath during exertion;Long Term: Able to use Dyspnea scale to guide intensity level when exercising independently    Knowledge and understanding of Target Heart Rate Range (THRR) Yes Yes Yes Yes    Intervention Provide education and explanation of THRR including how the numbers were predicted and where they are located for reference Provide education and explanation of THRR including how the numbers were predicted and where they are located for reference Provide education and explanation of THRR including how the numbers were predicted and where they are located for reference Provide education and explanation of THRR including how the numbers were predicted and where they are located for reference    Expected Outcomes Short Term: Able to state/look up THRR;Long Term: Able to use THRR to govern intensity when exercising independently;Short Term: Able to use daily as guideline for intensity in rehab Short Term: Able to state/look up THRR;Long Term: Able to use THRR to govern intensity when exercising independently;Short Term:  Able to use daily as guideline for intensity in rehab Short Term: Able to state/look up THRR;Long Term: Able to use THRR to govern intensity when exercising independently;Short Term: Able to use daily as guideline for intensity in rehab Short Term: Able to state/look up THRR;Long Term: Able to use THRR to govern intensity when exercising independently;Short Term: Able to use daily as guideline for intensity in rehab    Understanding of Exercise Prescription Yes Yes Yes Yes    Intervention Provide education, explanation, and written materials on patient's individual exercise prescription Provide education, explanation, and written materials on patient's individual exercise prescription Provide education, explanation, and written materials on patient's individual exercise prescription Provide education, explanation, and written materials on patient's individual exercise prescription    Expected Outcomes Short Term: Able to explain program exercise prescription;Long Term: Able to explain home exercise prescription to exercise independently Short Term: Able to explain program exercise prescription;Long Term: Able to explain home exercise prescription to exercise independently Short Term: Able to explain program exercise prescription;Long Term: Able to explain  home exercise prescription to exercise independently Short Term: Able to explain program exercise prescription;Long Term: Able to explain home exercise prescription to exercise independently             Exercise Goals Re-Evaluation:  Exercise Goals Re-Evaluation     Row Name 01/09/22 1606 02/04/22 1611 03/03/22 1148         Exercise Goal Re-Evaluation   Exercise Goals Review Increase Physical Activity;Increase Strength and Stamina;Able to understand and use rate of perceived exertion (RPE) scale;Able to understand and use Dyspnea scale;Knowledge and understanding of Target Heart Rate Range (THRR);Understanding of Exercise Prescription Increase  Physical Activity;Increase Strength and Stamina;Able to understand and use rate of perceived exertion (RPE) scale;Able to understand and use Dyspnea scale;Knowledge and understanding of Target Heart Rate Range (THRR);Understanding of Exercise Prescription Increase Physical Activity;Increase Strength and Stamina;Able to understand and use rate of perceived exertion (RPE) scale;Able to understand and use Dyspnea scale;Knowledge and understanding of Target Heart Rate Range (THRR);Understanding of Exercise Prescription     Comments Courney is scheduled to start exercise next week. Will monitor and progress as able. Pt has completed 6 exercise sessions. Pt exercises for 15 min on the track and recumbent bike. Pt averages 3.55 METS on the trrack and 1.8 METs at level 2 on the recumbent bike. Jady has increased her number laps within 15 min on the track and her workload on the recumbent bike. She has tolerated progressions well. Rosalee has missed a few classed due to doctor's appointment. She performs the warmup and cooldown standing without limiations. Will continue to monitor and progress as able. Pt has completed 11 exercise sessions. Pt exercises for 15 min on the track and recumbent bike. Pt averages 2.51 METS on the trrack and 2.2 METs at level 2.5 on the recumbent bike. Robena has increased her workload on the recumbent bike. She has tolerated this well. Aubery has missed a few classed due to doctor's appointments. This has made it difficult for me to progress her. She performs the warmup and cooldown standing without limitations. Will continue to monitor.     Expected Outcomes Through exercise at rehab and home, the patient will decrease shortness of breath with daily activities and feel confident in carrying out an exercise regimen at home. Through exercise at rehab and home, the patient will decrease shortness of breath with daily activities and feel confident in carrying out an exercise regimen at home.  Through exercise at rehab and home, the patient will decrease shortness of breath with daily activities and feel confident in carrying out an exercise regimen at home.              Nutrition & Weight - Outcomes:   Post Biometrics - 01/06/22 0902        Post  Biometrics   Grip Strength 12 kg   Lt hand            Nutrition:  Nutrition Therapy & Goals - 03/13/22 1444       Nutrition Therapy   Diet Heart Healthy/General Nutrition    Protein (specify units) 63-78g/kg ideal body weight      Personal Nutrition Goals   Comments Patient graduates today with good understanding of strategies for weight gain including high calorie foods, increasing eating frequecy/small meals, and nutrition supplements      Intervention Plan   Intervention Prescribe, educate and counsel regarding individualized specific dietary modifications aiming towards targeted core components such as weight, hypertension, lipid management, diabetes, heart failure and  other comorbidities.    Expected Outcomes Long Term Goal: Adherence to prescribed nutrition plan.             Nutrition Discharge:  Nutrition Assessments - 03/06/22 1433       Rate Your Plate Scores   Post Score 61             Education Questionnaire Score:  Knowledge Questionnaire Score - 03/06/22 1212       Knowledge Questionnaire Score   Post Score 15/18             Goals reviewed with patient; copy given to patient.

## 2022-03-13 NOTE — Progress Notes (Signed)
Daily Session Note  Patient Details  Name: DEBRALEE BRAAKSMA MRN: [id number] Date of Birth: 27-Jan-1936 Referring Provider:   April Manson Pulmonary Rehab Walk Test from 01/06/2022 in [place]  Referring Provider Glenford Peers       Encounter Date: 03/13/2022  Check In:  Session Check In - 03/13/22 1133       Check-In   Supervising physician immediately available to respond to emergencies [facility] immediately available    Physician(s) DR. Manuella Ghazi    Location [facility]    Staff Present Rosebud Poles, RN, BSN;Lisa Clarisa Schools, MS, ACSM-CEP, Exercise Physiologist    Virtual Visit No    Medication changes reported     No    Fall or balance concerns reported    No    Tobacco Cessation No Change    Warm-up and Cool-down Performed as group-led instruction    Resistance Training Performed Yes    VAD Patient? No    PAD/SET Patient? No      Pain Assessment   Currently in Pain? No/denies    Multiple Pain Sites No             Capillary Blood Glucose: No results found. However, due to the size of the patient record, not all encounters were searched. Please check Results Review for a complete set of results.    Social History   Tobacco Use  Smoking Status Never  Smokeless Tobacco Never    Goals Met:  Proper associated with RPD/PD & O2 Sat [place] with exercise equipment No report of concerns or symptoms today  Goals Unmet:  Not Applicable  Comments: Service time is from 1020 to 1040.    Dr. Rodman Pickle is Medical Director for Pulmonary Rehab at [facility].

## 2022-03-14 ENCOUNTER — Ambulatory Visit (HOSPITAL_COMMUNITY)
Admission: RE | Admit: 2022-03-14 | Discharge: 2022-03-14 | Disposition: A | Discharge status: Home / Self Care | Payer: Medicare PPO | Source: Ambulatory Visit | Attending: Nephrology | Admitting: Nephrology

## 2022-03-14 VITALS — BP 107/43 | HR 89 | Temp 98.0°F | Resp 14

## 2022-03-14 DIAGNOSIS — D638 Anemia in other chronic diseases classified elsewhere: Secondary | ICD-10-CM | POA: Diagnosis not present

## 2022-03-14 DIAGNOSIS — N1832 Chronic kidney disease, stage 3b: Secondary | ICD-10-CM | POA: Insufficient documentation

## 2022-03-14 LAB — IRON AND TIBC
Iron: 39 ug/dL (ref 28–170)
Saturation Ratios: 18 % (ref 10.4–31.8)
TIBC: 217 ug/dL — ABNORMAL LOW (ref 250–450)
UIBC: 178 ug/dL

## 2022-03-14 LAB — FERRITIN: Ferritin: 1159 ng/mL — ABNORMAL HIGH (ref 11–307)

## 2022-03-14 MED ORDER — EPOETIN ALFA-EPBX 10000 UNIT/ML IJ SOLN
INTRAMUSCULAR | Status: AC
  Filled 2022-03-14: qty 1

## 2022-03-14 MED ORDER — EPOETIN ALFA-EPBX 10000 UNIT/ML IJ SOLN
10000.0000 [IU] | INTRAMUSCULAR | Status: DC
  Administered 2022-03-14: 10000 [IU] via SUBCUTANEOUS

## 2022-03-17 ENCOUNTER — Observation Stay (HOSPITAL_COMMUNITY): Payer: Medicare PPO

## 2022-03-17 ENCOUNTER — Other Ambulatory Visit: Payer: Self-pay

## 2022-03-17 ENCOUNTER — Emergency Department (HOSPITAL_BASED_OUTPATIENT_CLINIC_OR_DEPARTMENT_OTHER): Payer: Medicare PPO

## 2022-03-17 ENCOUNTER — Inpatient Hospital Stay (HOSPITAL_BASED_OUTPATIENT_CLINIC_OR_DEPARTMENT_OTHER)
Admission: EM | Admit: 2022-03-17 | Discharge: 2022-03-22 | DRG: 314 | Disposition: A | Discharge status: Home / Self Care | Payer: Medicare PPO | Attending: Internal Medicine | Admitting: Internal Medicine

## 2022-03-17 ENCOUNTER — Encounter (HOSPITAL_BASED_OUTPATIENT_CLINIC_OR_DEPARTMENT_OTHER): Payer: Self-pay | Admitting: Emergency Medicine

## 2022-03-17 DIAGNOSIS — Z9842 Cataract extraction status, left eye: Secondary | ICD-10-CM

## 2022-03-17 DIAGNOSIS — Z86711 Personal history of pulmonary embolism: Secondary | ICD-10-CM | POA: Diagnosis not present

## 2022-03-17 DIAGNOSIS — R778 Other specified abnormalities of plasma proteins: Secondary | ICD-10-CM

## 2022-03-17 DIAGNOSIS — R7989 Other specified abnormal findings of blood chemistry: Secondary | ICD-10-CM | POA: Diagnosis present

## 2022-03-17 DIAGNOSIS — Z8052 Family history of malignant neoplasm of bladder: Secondary | ICD-10-CM

## 2022-03-17 DIAGNOSIS — Z978 Presence of other specified devices: Secondary | ICD-10-CM | POA: Diagnosis not present

## 2022-03-17 DIAGNOSIS — I272 Pulmonary hypertension, unspecified: Secondary | ICD-10-CM

## 2022-03-17 DIAGNOSIS — J90 Pleural effusion, not elsewhere classified: Secondary | ICD-10-CM | POA: Diagnosis not present

## 2022-03-17 DIAGNOSIS — K219 Gastro-esophageal reflux disease without esophagitis: Secondary | ICD-10-CM | POA: Diagnosis present

## 2022-03-17 DIAGNOSIS — N179 Acute kidney failure, unspecified: Secondary | ICD-10-CM

## 2022-03-17 DIAGNOSIS — N183 Chronic kidney disease, stage 3 unspecified: Secondary | ICD-10-CM | POA: Diagnosis present

## 2022-03-17 DIAGNOSIS — I2723 Pulmonary hypertension due to lung diseases and hypoxia: Secondary | ICD-10-CM | POA: Diagnosis present

## 2022-03-17 DIAGNOSIS — I5032 Chronic diastolic (congestive) heart failure: Secondary | ICD-10-CM | POA: Diagnosis present

## 2022-03-17 DIAGNOSIS — I2721 Secondary pulmonary arterial hypertension: Secondary | ICD-10-CM | POA: Diagnosis present

## 2022-03-17 DIAGNOSIS — Z923 Personal history of irradiation: Secondary | ICD-10-CM

## 2022-03-17 DIAGNOSIS — Y92239 Unspecified place in hospital as the place of occurrence of the external cause: Secondary | ICD-10-CM | POA: Diagnosis not present

## 2022-03-17 DIAGNOSIS — Z888 Allergy status to other drugs, medicaments and biological substances status: Secondary | ICD-10-CM | POA: Diagnosis not present

## 2022-03-17 DIAGNOSIS — I5030 Unspecified diastolic (congestive) heart failure: Secondary | ICD-10-CM | POA: Diagnosis not present

## 2022-03-17 DIAGNOSIS — R0602 Shortness of breath: Secondary | ICD-10-CM | POA: Diagnosis not present

## 2022-03-17 DIAGNOSIS — E43 Unspecified severe protein-calorie malnutrition: Secondary | ICD-10-CM | POA: Insufficient documentation

## 2022-03-17 DIAGNOSIS — L89311 Pressure ulcer of right buttock, stage 1: Secondary | ICD-10-CM | POA: Diagnosis present

## 2022-03-17 DIAGNOSIS — Z833 Family history of diabetes mellitus: Secondary | ICD-10-CM

## 2022-03-17 DIAGNOSIS — Z800 Family history of malignant neoplasm of digestive organs: Secondary | ICD-10-CM

## 2022-03-17 DIAGNOSIS — J9621 Acute and chronic respiratory failure with hypoxia: Secondary | ICD-10-CM | POA: Diagnosis present

## 2022-03-17 DIAGNOSIS — M341 CR(E)ST syndrome: Secondary | ICD-10-CM | POA: Diagnosis present

## 2022-03-17 DIAGNOSIS — I3139 Other pericardial effusion (noninflammatory): Principal | ICD-10-CM | POA: Diagnosis present

## 2022-03-17 DIAGNOSIS — Z681 Body mass index (BMI) 19 or less, adult: Secondary | ICD-10-CM

## 2022-03-17 DIAGNOSIS — I10 Essential (primary) hypertension: Secondary | ICD-10-CM | POA: Diagnosis not present

## 2022-03-17 DIAGNOSIS — T461X6A Underdosing of calcium-channel blockers, initial encounter: Secondary | ICD-10-CM | POA: Diagnosis present

## 2022-03-17 DIAGNOSIS — Z515 Encounter for palliative care: Secondary | ICD-10-CM | POA: Diagnosis not present

## 2022-03-17 DIAGNOSIS — Z9841 Cataract extraction status, right eye: Secondary | ICD-10-CM

## 2022-03-17 DIAGNOSIS — R079 Chest pain, unspecified: Secondary | ICD-10-CM | POA: Diagnosis not present

## 2022-03-17 DIAGNOSIS — Z853 Personal history of malignant neoplasm of breast: Secondary | ICD-10-CM | POA: Diagnosis not present

## 2022-03-17 DIAGNOSIS — I350 Nonrheumatic aortic (valve) stenosis: Secondary | ICD-10-CM

## 2022-03-17 DIAGNOSIS — I130 Hypertensive heart and chronic kidney disease with heart failure and stage 1 through stage 4 chronic kidney disease, or unspecified chronic kidney disease: Secondary | ICD-10-CM | POA: Diagnosis present

## 2022-03-17 DIAGNOSIS — J189 Pneumonia, unspecified organism: Secondary | ICD-10-CM | POA: Diagnosis present

## 2022-03-17 DIAGNOSIS — D508 Other iron deficiency anemias: Secondary | ICD-10-CM

## 2022-03-17 DIAGNOSIS — J8410 Pulmonary fibrosis, unspecified: Secondary | ICD-10-CM | POA: Diagnosis present

## 2022-03-17 DIAGNOSIS — Z8042 Family history of malignant neoplasm of prostate: Secondary | ICD-10-CM

## 2022-03-17 DIAGNOSIS — Z820 Family history of epilepsy and other diseases of the nervous system: Secondary | ICD-10-CM

## 2022-03-17 DIAGNOSIS — Z885 Allergy status to narcotic agent status: Secondary | ICD-10-CM | POA: Diagnosis not present

## 2022-03-17 DIAGNOSIS — R0789 Other chest pain: Secondary | ICD-10-CM | POA: Diagnosis present

## 2022-03-17 DIAGNOSIS — Z85528 Personal history of other malignant neoplasm of kidney: Secondary | ICD-10-CM

## 2022-03-17 DIAGNOSIS — N1831 Chronic kidney disease, stage 3a: Secondary | ICD-10-CM | POA: Diagnosis present

## 2022-03-17 DIAGNOSIS — R531 Weakness: Secondary | ICD-10-CM | POA: Diagnosis not present

## 2022-03-17 DIAGNOSIS — Z79899 Other long term (current) drug therapy: Secondary | ICD-10-CM

## 2022-03-17 DIAGNOSIS — R072 Precordial pain: Principal | ICD-10-CM

## 2022-03-17 DIAGNOSIS — J849 Interstitial pulmonary disease, unspecified: Secondary | ICD-10-CM | POA: Diagnosis not present

## 2022-03-17 DIAGNOSIS — N1832 Chronic kidney disease, stage 3b: Secondary | ICD-10-CM | POA: Diagnosis not present

## 2022-03-17 DIAGNOSIS — I5033 Acute on chronic diastolic (congestive) heart failure: Secondary | ICD-10-CM | POA: Diagnosis present

## 2022-03-17 DIAGNOSIS — Y92009 Unspecified place in unspecified non-institutional (private) residence as the place of occurrence of the external cause: Secondary | ICD-10-CM | POA: Diagnosis not present

## 2022-03-17 DIAGNOSIS — Z86718 Personal history of other venous thrombosis and embolism: Secondary | ICD-10-CM

## 2022-03-17 DIAGNOSIS — Z881 Allergy status to other antibiotic agents status: Secondary | ICD-10-CM | POA: Diagnosis not present

## 2022-03-17 DIAGNOSIS — I2729 Other secondary pulmonary hypertension: Secondary | ICD-10-CM | POA: Diagnosis present

## 2022-03-17 DIAGNOSIS — I080 Rheumatic disorders of both mitral and aortic valves: Secondary | ICD-10-CM | POA: Diagnosis present

## 2022-03-17 DIAGNOSIS — Z9981 Dependence on supplemental oxygen: Secondary | ICD-10-CM

## 2022-03-17 DIAGNOSIS — T508X5A Adverse effect of diagnostic agents, initial encounter: Secondary | ICD-10-CM | POA: Diagnosis not present

## 2022-03-17 DIAGNOSIS — Z7189 Other specified counseling: Secondary | ICD-10-CM | POA: Diagnosis not present

## 2022-03-17 DIAGNOSIS — Z801 Family history of malignant neoplasm of trachea, bronchus and lung: Secondary | ICD-10-CM

## 2022-03-17 DIAGNOSIS — Z95828 Presence of other vascular implants and grafts: Secondary | ICD-10-CM | POA: Diagnosis not present

## 2022-03-17 DIAGNOSIS — N1411 Contrast-induced nephropathy: Secondary | ICD-10-CM | POA: Diagnosis not present

## 2022-03-17 DIAGNOSIS — Z86010 Personal history of colonic polyps: Secondary | ICD-10-CM

## 2022-03-17 DIAGNOSIS — L8990 Pressure ulcer of unspecified site, unspecified stage: Secondary | ICD-10-CM | POA: Insufficient documentation

## 2022-03-17 DIAGNOSIS — D509 Iron deficiency anemia, unspecified: Secondary | ICD-10-CM | POA: Diagnosis present

## 2022-03-17 DIAGNOSIS — R5381 Other malaise: Secondary | ICD-10-CM | POA: Diagnosis present

## 2022-03-17 LAB — COMPREHENSIVE METABOLIC PANEL
ALT: 13 U/L (ref 0–44)
AST: 27 U/L (ref 15–41)
Albumin: 3.6 g/dL (ref 3.5–5.0)
Alkaline Phosphatase: 54 U/L (ref 38–126)
Anion gap: 11 (ref 5–15)
BUN: 33 mg/dL — ABNORMAL HIGH (ref 8–23)
CO2: 24 mmol/L (ref 22–32)
Calcium: 9 mg/dL (ref 8.9–10.3)
Chloride: 98 mmol/L (ref 98–111)
Creatinine, Ser: 1.24 mg/dL — ABNORMAL HIGH (ref 0.44–1.00)
GFR, Estimated: 43 mL/min — ABNORMAL LOW (ref >60)
Glucose, Bld: 165 mg/dL — ABNORMAL HIGH (ref 70–99)
Potassium: 4 mmol/L (ref 3.5–5.1)
Sodium: 133 mmol/L — ABNORMAL LOW (ref 135–145)
Total Bilirubin: 1 mg/dL (ref 0.3–1.2)
Total Protein: 6.9 g/dL (ref 6.5–8.1)

## 2022-03-17 LAB — CBC WITH DIFFERENTIAL/PLATELET
Abs Immature Granulocytes: 0.05 10*3/uL (ref 0.00–0.07)
Basophils Absolute: 0 10*3/uL (ref 0.0–0.1)
Basophils Relative: 0 %
Eosinophils Absolute: 0 10*3/uL (ref 0.0–0.5)
Eosinophils Relative: 0 %
HCT: 32.8 % — ABNORMAL LOW (ref 36.0–46.0)
Hemoglobin: 11 g/dL — ABNORMAL LOW (ref 12.0–15.0)
Immature Granulocytes: 1 %
Lymphocytes Relative: 5 %
Lymphs Abs: 0.6 10*3/uL — ABNORMAL LOW (ref 0.7–4.0)
MCH: 36.2 pg — ABNORMAL HIGH (ref 26.0–34.0)
MCHC: 33.5 g/dL (ref 30.0–36.0)
MCV: 107.9 fL — ABNORMAL HIGH (ref 80.0–100.0)
Monocytes Absolute: 0.6 10*3/uL (ref 0.1–1.0)
Monocytes Relative: 5 %
Neutro Abs: 9.7 10*3/uL — ABNORMAL HIGH (ref 1.7–7.7)
Neutrophils Relative %: 89 %
Platelets: 215 10*3/uL (ref 150–400)
RBC: 3.04 MIL/uL — ABNORMAL LOW (ref 3.87–5.11)
RDW: 13.5 % (ref 11.5–15.5)
WBC: 10.9 10*3/uL — ABNORMAL HIGH (ref 4.0–10.5)
nRBC: 0 % (ref 0.0–0.2)

## 2022-03-17 LAB — BRAIN NATRIURETIC PEPTIDE: B Natriuretic Peptide: 2025.3 pg/mL — ABNORMAL HIGH (ref 0.0–100.0)

## 2022-03-17 LAB — TROPONIN I (HIGH SENSITIVITY)
Troponin I (High Sensitivity): 30 ng/L — ABNORMAL HIGH (ref <18)
Troponin I (High Sensitivity): 26 ng/L — ABNORMAL HIGH (ref <18)

## 2022-03-17 MED ORDER — ACETAMINOPHEN 325 MG PO TABS: 650.0000 mg | ORAL_TABLET | Freq: Four times a day (QID) | ORAL | Status: DC | PRN

## 2022-03-17 MED ORDER — SODIUM CHLORIDE 0.9 % IV SOLN: 250.0000 mL | INTRAVENOUS | Status: DC | PRN

## 2022-03-17 MED ORDER — SODIUM CHLORIDE 0.9% FLUSH
3.0000 mL | Freq: Two times a day (BID) | INTRAVENOUS | Status: DC
  Administered 2022-03-17 – 2022-03-22 (×8): 3 mL via INTRAVENOUS

## 2022-03-17 MED ORDER — SODIUM CHLORIDE 0.9% FLUSH: 3.0000 mL | INTRAVENOUS | Status: DC | PRN

## 2022-03-17 MED ORDER — MONTELUKAST SODIUM 10 MG PO TABS
10.0000 mg | ORAL_TABLET | Freq: Every day | ORAL | Status: DC
  Administered 2022-03-17 – 2022-03-21 (×5): 10 mg via ORAL
  Filled 2022-03-17 (×6): qty 1

## 2022-03-17 MED ORDER — IOHEXOL 350 MG/ML SOLN
50.0000 mL | Freq: Once | INTRAVENOUS | Status: AC | PRN
  Administered 2022-03-17: 50 mL via INTRAVENOUS

## 2022-03-17 MED ORDER — DILTIAZEM HCL ER COATED BEADS 120 MG PO CP24
120.0000 mg | ORAL_CAPSULE | Freq: Every day | ORAL | Status: DC
  Administered 2022-03-18 – 2022-03-22 (×5): 120 mg via ORAL
  Filled 2022-03-17 (×6): qty 1

## 2022-03-17 MED ORDER — SUCRALFATE 1 G PO TABS: 1.0000 g | ORAL_TABLET | Freq: Four times a day (QID) | ORAL | Status: DC | PRN

## 2022-03-17 MED ORDER — FUROSEMIDE 10 MG/ML IJ SOLN
40.0000 mg | Freq: Once | INTRAMUSCULAR | Status: AC
  Administered 2022-03-17: 40 mg via INTRAVENOUS
  Filled 2022-03-17: qty 4

## 2022-03-17 MED ORDER — ALBUTEROL SULFATE HFA 108 (90 BASE) MCG/ACT IN AERS: 2.0000 | INHALATION_SPRAY | RESPIRATORY_TRACT | Status: DC | PRN

## 2022-03-17 MED ORDER — ALBUTEROL SULFATE (2.5 MG/3ML) 0.083% IN NEBU: 2.5000 mg | INHALATION_SOLUTION | RESPIRATORY_TRACT | Status: DC | PRN

## 2022-03-17 MED ORDER — ALPRAZOLAM 0.25 MG PO TABS: 0.1250 mg | ORAL_TABLET | Freq: Every evening | ORAL | Status: DC | PRN

## 2022-03-17 MED ORDER — HYDROCODONE-ACETAMINOPHEN 5-325 MG PO TABS
1.0000 | ORAL_TABLET | ORAL | Status: DC | PRN
  Filled 2022-03-17: qty 2

## 2022-03-17 MED ORDER — PANTOPRAZOLE SODIUM 40 MG PO TBEC
40.0000 mg | DELAYED_RELEASE_TABLET | Freq: Two times a day (BID) | ORAL | Status: DC
  Administered 2022-03-18 – 2022-03-22 (×9): 40 mg via ORAL
  Filled 2022-03-17 (×11): qty 1

## 2022-03-17 MED ORDER — ACETAMINOPHEN 650 MG RE SUPP: 650.0000 mg | Freq: Four times a day (QID) | RECTAL | Status: DC | PRN

## 2022-03-17 MED ORDER — SPIRONOLACTONE 12.5 MG HALF TABLET
12.5000 mg | ORAL_TABLET | Freq: Every day | ORAL | Status: DC
  Administered 2022-03-18 – 2022-03-19 (×2): 12.5 mg via ORAL
  Filled 2022-03-17 (×3): qty 1

## 2022-03-17 NOTE — ED Provider Triage Note (Signed)
Emergency Medicine Provider Triage Evaluation Note  Paula Ward , a 86 y.o. female  was evaluated in triage.  Pt complains of chest pain, shortness of breath.  She missed diltiazem for about 3 days, her shortness of breath started after.   She reports that she wears oxygen, 2 lpm at night and with exertion.    Physical Exam  There were no vitals taken for this visit. Gen:   Awake, no distress   Resp:  Normal effort, rales in bilateral bases MSK:   Moves extremities without difficulty  Other:  Loud heart murmur  Medical Decision Making  Medically screening exam initiated at 4:04 PM.  Appropriate orders placed.  [name] was informed that the remainder of the evaluation will be completed by another provider, this initial triage assessment does not replace that evaluation, and the importance of remaining in the ED until their evaluation is complete.     Lorin Glass, [place] 03/17/22 1609

## 2022-03-17 NOTE — ED Notes (Addendum)
Pt ambulatory to bathroom

## 2022-03-17 NOTE — ED Notes (Signed)
O2 qHS and with exertion at home.  SpO2 88-92% on r/a in triage.  Placed on 2lpm [place]

## 2022-03-17 NOTE — Subjective & Objective (Signed)
Presents with left and central chest pain and shortness of breath she initially came to [facility] reports chest pain is worse with deep inspiration.  She did run out of her diltiazem a few days ago and her blood pressure initially on presentation was 170s. Patient has extensive cardiac history and pericardial effusion

## 2022-03-17 NOTE — Assessment & Plan Note (Signed)
Appreciate cardiology consult echogram in a.m.

## 2022-03-17 NOTE — ED Notes (Signed)
Phone Handoff Report given to Charge RN at [facility] ED

## 2022-03-17 NOTE — ED Triage Notes (Signed)
Pt POV ambulatory. Reports missing 2 doses of her daily diltiazem, now having shob, cp, and weakness. Describes cp as soreness. Denies n/v, diarrhea, LE swelling, new cough.

## 2022-03-17 NOTE — Assessment & Plan Note (Signed)
-  chronic avoid nephrotoxic medications such as NSAIDs, Vanco Zosyn combo,  avoid hypotension, continue to follow renal function

## 2022-03-17 NOTE — ED Notes (Addendum)
Spoke with [facility] to expedite tx from [facility] ED to [facility] ED for Cardiology Services. Per ED MD 911/ [facility] was notified to request assistance in tx as well, per EMS Supervisor they would not be able to assist [place] as this time. Again, ED MD was informed. No new orders rec at this time.

## 2022-03-17 NOTE — Assessment & Plan Note (Addendum)
-  Pt diagnosed with CHF based on presence of the following  Cardiomegaly, With noted response to IV diuretic in ER  admit on telemetry,  cycle cardiac enzymes, Troponin 26 30   obtain serial ECG  to evaluate for ischemia as a cause of heart failure  monitor daily weight: There were no vitals filed for this visit. Last BNP BNP (last 3 results) Recent Labs    03/28/21 1356 03/17/22 1622  BNP 2,011.1* 2,025.3*     diurese with IV lasix and monitor orthostatics and creatinine to avoid over diuresis. Cardiology wanted to try 1 dose of Lasix and then later assess in the morning  Order echogram to evaluate EF and valves  ACE/ARBi Contraindicated    cardiology consulted

## 2022-03-17 NOTE — ED Triage Notes (Signed)
Pt BIB [facility] due to chest pain. Transfer from [facility]

## 2022-03-17 NOTE — Assessment & Plan Note (Signed)
Chronic stable has gotten iron infusions in the past

## 2022-03-17 NOTE — Assessment & Plan Note (Signed)
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

## 2022-03-17 NOTE — ED Notes (Signed)
Paged Dr. Ellyn Hack

## 2022-03-17 NOTE — Assessment & Plan Note (Signed)
Consulted pulmonology will see in consult in a.m.

## 2022-03-17 NOTE — ED Provider Notes (Signed)
[facility]Y DEPARTMENT Provider Note   CSN: [id number] Arrival date & time: 03/17/22  1555     History  Chief Complaint  Patient presents with   Shortness of Breath   Weakness    LOREDANA MEDELLIN is a 86 y.o. female.  The history is provided by the patient, the spouse and medical records. No language interpreter was used.  Shortness of Breath Severity:  Moderate Onset quality:  Gradual Duration:  2 days Timing:  Constant Progression:  Unchanged Chronicity:  New Context: not URI   Relieved by:  Nothing Worsened by:  Deep breathing Ineffective treatments:  None tried Associated symptoms: no abdominal pain, no chest pain, no cough, no diaphoresis, no fever, no headaches, no hemoptysis, no neck pain, no sputum production, no vomiting and no wheezing   Risk factors: hx of PE/DVT       Home Medications Prior to Admission medications   Medication Sig Start Date End Date Taking? Authorizing Provider  acetaminophen (TYLENOL) 325 MG tablet Take 650 mg by mouth every 6 (six) hours as needed for mild pain.     [provider]  albuterol (VENTOLIN HFA) 108 (90 Base) MCG/ACT inhaler INHALE 2 PUFFS INTO THE LUNGS EVERY 6 HOURS AS NEEDED FOR WHEEZING OR SHORTNESS OF BREATH 03/06/20   Hoyt Koch, MD  ALPRAZolam Duanne Moron) 0.25 MG tablet Take 0.5 tablets (0.125 mg total) by mouth at bedtime as needed. Patient taking differently: Take 0.125 mg by mouth at bedtime as needed for anxiety or sleep. 10/29/20   Hoyt Koch, MD  diltiazem (CARDIZEM CD) 120 MG 24 hr capsule Take 120 mg by mouth daily.    [provider]  fluticasone (FLONASE) 50 MCG/ACT nasal spray INSTILL 1 SPRAY INTO EACH NOSTRIL TWICE A DAY IF NEEDED Patient taking differently: Place 1 spray into both nostrils daily as needed for allergies. 09/18/20   Hoyt Koch, MD  furosemide (LASIX) 20 MG tablet Take 1 tablet by mouth once daily 12/12/21   Hoyt Koch,  MD  montelukast (SINGULAIR) 10 MG tablet TAKE 1 TABLET BY MOUTH AT BEDTIME 03/03/22   Hoyt Koch, MD  Mouthwashes (DRY MOUTH MOUTHWASH) LIQD Use as directed 5 mLs in the mouth or throat daily as needed (dry mouth). Biotene 04/01/21   Shelly Coss, MD  Multiple Vitamins-Iron (MULTIVITAMIN/IRON PO) Take 1 tablet by mouth daily.    [provider]  OPSUMIT 10 MG TABS Take 10 mg by mouth daily. 08/16/15   [provider]  OXYGEN Inhale into the lungs. 2 LMP at night and upon exertion    [provider]  pantoprazole (PROTONIX) 40 MG tablet Take 1 tablet (40 mg total) by mouth 2 (two) times daily before a meal. 02/27/22   Nandigam, Venia Minks, MD  spironolactone (ALDACTONE) 25 MG tablet Take 12.5 mg by mouth daily. 06/09/20   [provider]  sucralfate (CARAFATE) 1 g tablet Take 1 tablet (1 g total) by mouth every 6 (six) hours as needed. 02/07/22   Mauri Pole, MD      Allergies    Codeine, Other, Erythromycin, Lisinopril, Mycophenolate mofetil, and Sulfa antibiotics    Review of Systems   Review of Systems  Constitutional:  Positive for fatigue. Negative for chills, diaphoresis and fever.  HENT:  Negative for congestion.   Respiratory:  Positive for shortness of breath. Negative for cough, hemoptysis, sputum production, chest tightness and wheezing.   Cardiovascular:  Negative for chest pain and  palpitations.  Gastrointestinal:  Negative for abdominal pain, constipation, diarrhea, nausea and vomiting.  Genitourinary:  Negative for dysuria and flank pain.  Musculoskeletal:  Negative for back pain, neck pain and neck stiffness.  Neurological:  Negative for light-headedness, numbness and headaches.  Psychiatric/Behavioral:  Negative for agitation and confusion.   All other systems reviewed and are negative.  Physical Exam Updated Vital Signs BP (!) 129/50 (BP Location: Right Arm)   Pulse 95   Temp 98.3 F (36.8 C) (Oral)   Resp (!) 24    SpO2 94%  Physical Exam Vitals and nursing note reviewed.  Constitutional:      General: She is not in acute distress.    Appearance: She is well-developed. She is ill-appearing. She is not toxic-appearing or diaphoretic.  HENT:     Head: Normocephalic and atraumatic.     Mouth/Throat:     Pharynx: No pharyngeal swelling or oropharyngeal exudate.  Eyes:     Conjunctiva/sclera: Conjunctivae normal.     Pupils: Pupils are equal, round, and reactive to light.  Cardiovascular:     Rate and Rhythm: Normal rate and regular rhythm.     Heart sounds: Murmur heard.  Pulmonary:     Effort: Pulmonary effort is normal. Tachypnea present. No respiratory distress.     Breath sounds: No stridor. Rales present. No wheezing or rhonchi.  Chest:     Chest wall: No tenderness.  Abdominal:     Palpations: Abdomen is soft.     Tenderness: There is no abdominal tenderness.  Musculoskeletal:        General: No swelling.     Cervical back: Neck supple.     Right lower leg: No tenderness. No edema.     Left lower leg: No tenderness. No edema.  Skin:    General: Skin is warm and dry.     Capillary Refill: Capillary refill takes less than 2 seconds.     Findings: No erythema.  Neurological:     General: No focal deficit present.     Mental Status: She is alert.  Psychiatric:        Mood and Affect: Mood normal.    ED Results / Procedures / Treatments   Labs (all labs ordered are listed, but only abnormal results are displayed) Labs Reviewed  BRAIN NATRIURETIC PEPTIDE - Abnormal; Notable for the following components:      Result Value   B Natriuretic Peptide 2,025.3 (*)    All other components within normal limits  COMPREHENSIVE METABOLIC PANEL - Abnormal; Notable for the following components:   Sodium 133 (*)    Glucose, Bld 165 (*)    BUN 33 (*)    Creatinine, Ser 1.24 (*)    GFR, Estimated 43 (*)    All other components within normal limits  CBC WITH DIFFERENTIAL/PLATELET - Abnormal;  Notable for the following components:   WBC 10.9 (*)    RBC 3.04 (*)    Hemoglobin 11.0 (*)    HCT 32.8 (*)    MCV 107.9 (*)    MCH 36.2 (*)    Neutro Abs 9.7 (*)    Lymphs Abs 0.6 (*)    All other components within normal limits  TROPONIN I (HIGH SENSITIVITY)    EKG EKG Interpretation  Date/Time:  Monday Mar 17 2022 16:16:17 EDT Ventricular Rate:  98 PR Interval:  152 QRS Duration: 70 QT Interval:  342 QTC Calculation: 436 R Axis:   12 Text Interpretation: Normal sinus rhythm Low  voltage QRS Septal infarct , age undetermined Abnormal ECG When compared with ECG of 28-Mar-2021 13:52, PREVIOUS ECG IS PRESENT wandering baseline, will repeat Confirmed by Antony Blackbird [phone]) on 03/17/2022 4:19:02 PM  Radiology No results found.  Procedures Procedures    CRITICAL CARE Performed by: Gwenyth Allegra Jurnee Nakayama Total critical care time: 40 minutes Critical care time was exclusive of separately billable procedures and treating other patients. Critical care was necessary to treat or prevent imminent or life-threatening deterioration. Critical care was time spent personally by me on the following activities: development of treatment plan with patient and/or surrogate as well as nursing, discussions with consultants, evaluation of patient's response to treatment, examination of patient, obtaining history from patient or surrogate, ordering and performing treatments and interventions, ordering and review of laboratory studies, ordering and review of radiographic studies, pulse oximetry and re-evaluation of patient's condition.   Medications Ordered in ED Medications - No data to display  ED Course/ Medical Decision Making/ A&P                           Medical Decision Making Amount and/or Complexity of Data Reviewed ECG/medicine tests: ordered.    NAYDA RIESEN is a 86 y.o. female with a complex past medical history including hypertension, pulmonary hypertension, previous  pulmonary embolism not on anticoagulation but has IVC filter in place, CKD, breast cancer, GERD, scleroderma, interstitial lung disease with intermittent home oxygen use at night and with exertion, renal cell carcinoma, CHF, and known pericardial effusion who presents for chest pain and shortness of breath.  According to patient, since yesterday, she has had pain in her left central chest that is pressure and aching.  She describes as 8 out of 10 in severity at its worst.  It is slightly improved now.  She reports it is slightly pleuritic and she also has some epigastric discomfort.  She denies nausea, vomiting, diaphoresis.  She does report shortness of breath.  On arrival her oxygen saturations were around 88% on room air so she was placed on 2 L.  She denies any new leg pain or leg swelling and denies other complaints.  No fevers, chills, J[place], or cough reported.  Patient was seen in triage and had EKG obtained.  EKG does show some concerning changes including ST elevations in 2, 3, and aVF as well as some depression in aVL.  I contacted the STEMI physician on-call, Dr. Ellyn Hack, who reviewed the EKG.  He does not think she has a STEMI so we will not activate as a code STEMI but he does think she needs to be transferred emergently ED to ED to [facility] so that the consulting cardiology team can see her, get an echo given the pericardial effusion, and determine if she needs to go to the Cath Lab.  He does not want [place] to start heparin given the large pericardial effusion until she gets an echo.  We will get the chest x-ray and troponin and labs and she will be transferred ED to ED.  I spoke with Dr. Billy Fischer who will accept the patient for transfer to [facility].   Patient will be transferred by [facility] for further evaluation and work-up of chest discomfort.  Primary etiologies considered on the differential diagnosis include a cardiac cause, pericardial effusion, tamponade, heart failure exacerbation,  pulmonary embolism given her history of PE, or even infectious etiology.  Patient be transferred to continue her work-up.  Final Clinical Impression(s) / ED Diagnoses Final diagnoses:  Chest pain, unspecified type    Clinical Impression: 1. Chest pain, unspecified type     Disposition: ED to ED transfer to [facility] excepted by Dr. Billy Fischer to be seen by cardiology.  This note was prepared with assistance of Systems analyst. Occasional wrong-word or sound-a-like substitutions may have occurred due to the inherent limitations of voice recognition software.     Chauntelle Azpeitia, Gwenyth Allegra, MD 03/17/22 [phone]

## 2022-03-17 NOTE — ED Notes (Signed)
Engineer, technical sales at bedside, additional bedside report provided.

## 2022-03-17 NOTE — Assessment & Plan Note (Signed)
Continue Cardizem and Aldactone for now And 1 dose of Lasix As per cardiology recommendations assess fluid status and blood pressure throughout admission and adjust as needed

## 2022-03-17 NOTE — ED Provider Notes (Addendum)
Patient was seen in [facility] ED with chest pain and transferred here to see cardiology.   On review of her ecgs, there is slight st elev inferiorly however similar appearance noted on prior ecgs - therefore does not appear c/w stemi.   Note made on prior imaging of enlarged cardiac silhouette and pericardial effusion on prior imaging.   Cardiology consulted as per plan of Dr Sherry Ruffing.      Lajean Saver, MD 03/17/22 [place]   Cardiology consulted - they recommend medicine admission, they will follow in consult.   Hospitalists consulted for admission.      Lajean Saver, MD 03/17/22 2150

## 2022-03-17 NOTE — Assessment & Plan Note (Addendum)
Multifactorial patient with history of crest syndrome Given pleuritic component order CTA  Other differential includes but not limited to GI symptoms this possibility to explain chest pain as well also has pericardial effusion.  Obtain echogram in the morning troponin at this point flat.  Continue to monitor continue Carafate pain management as needed Can also do swallow evaluation to see if there is any esophageal dysmotility that could explain her chest pain

## 2022-03-17 NOTE — H&P (Signed)
Paula Ward LFY:[id number] DOB: 1936-02-16 DOA: 03/17/2022     PCP: Hoyt Koch, MD   Outpatient Specialists:  CARDS: Kirk Ruths, MD     Pulmonary   Dr.Dewald, Roderic Palau Also goes to [facility] for pulmonary and cardiology  GI   Dr. Silverio Decamp    Patient arrived to ER on 03/17/22 at 1555 Referred by Attending Lajean Saver, MD    Patient coming from:    home Lives  With family    Chief Complaint:   Chief Complaint  Patient presents with   Shortness of Breath   Weakness   Chest Pain    HPI: Paula Ward is a 86 y.o. 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  She did run out of her diltiazem a few days ago and her blood pressure initially on presentation was 170s. Patient has extensive cardiac history and pericardial effusion  Has been taking her lasix Still has pressure feeling in her chest worse with inspiration  No leg edema  Sp IVC filter did not tolerate blood thinner in the past due to bleeding She is on 2L of O2 at night chronically  Never smoked no EtOH   Regarding pertinent Chronic problems:      HTN on Cardizem   chronic CHF diastolic/systolic/ combined - EF was 55% Lasix         Hx of DVT/PE on - not anticoagulation sp IVC filter   CKD stage IIIa- baseline Cr 1.2 Estimated Creatinine Clearance: 25.8 mL/min (A) (by C-G formula based on SCr of 1.24 mg/dL (H)).  Lab Results  Component Value Date   CREATININE 1.24 (H) 03/17/2022    CREATININE 1.26 (H) 03/12/2022   CREATININE 1.28 (H) 01/14/2022    Chronic anemia - baseline hg Hemoglobin & Hematocrit  Recent Labs    02/14/22 1049 03/12/22 1247 03/17/22 1621  HGB 11.4* 11.9* 11.0*     While in ER:   Troponin mildly elevated but flat.  Cardiology has seen patient in consult felt that this chest pain is likely nonischemic Recommended 1 dose of IV Lasix recheck in the morning recheck echo Pericardial effusion felt to be chronic     CXR - Left lobe atelectasis versus infiltrate, without significant change.   Stable cardiomegaly and diffuse pulmonary interstitial prominence.    Following Medications were ordered in ER: Medications  furosemide (LASIX) injection 40 mg (40 mg Intravenous Given 03/17/22 2116)    _______________________________________________________ ER Provider Called:   cardiology   Dr Harrington Challenger They Recommend admit to medicine   Seen in ER   ED Triage Vitals  Enc Vitals Group     BP 03/17/22 1607 (!) 129/50     Pulse Rate 03/17/22 1607 95     Resp 03/17/22 1607 (!) 24     Temp 03/17/22 1607 98.3 F (36.8 C)     Temp Source 03/17/22 1607 Oral     SpO2 03/17/22 1607 94 %     Weight --      Height --  Head Circumference --      Peak Flow --      Pain Score 03/17/22 1608 5     Pain Loc --      Pain Edu? --      Excl. in [place]? --   TMAX(24)@     _________________________________________ Significant initial  Findings: Abnormal Labs Reviewed  BRAIN NATRIURETIC PEPTIDE - Abnormal; Notable for the following components:      Result Value   B Natriuretic Peptide 2,025.3 (*)    All other components within normal limits  COMPREHENSIVE METABOLIC PANEL - Abnormal; Notable for the following components:   Sodium 133 (*)    Glucose, Bld 165 (*)    BUN 33 (*)    Creatinine, Ser 1.24 (*)    GFR, Estimated 43 (*)    All other components within normal limits  CBC WITH DIFFERENTIAL/PLATELET - Abnormal; Notable for the following components:   WBC  10.9 (*)    RBC 3.04 (*)    Hemoglobin 11.0 (*)    HCT 32.8 (*)    MCV 107.9 (*)    MCH 36.2 (*)    Neutro Abs 9.7 (*)    Lymphs Abs 0.6 (*)    All other components within normal limits  TROPONIN I (HIGH SENSITIVITY) - Abnormal; Notable for the following components:   Troponin I (High Sensitivity) 30 (*)    All other components within normal limits  TROPONIN I (HIGH SENSITIVITY) - Abnormal; Notable for the following components:   Troponin I (High Sensitivity) 26 (*)    All other components within normal limits     _________________________ Troponin  26 - 30 ECG: Ordered Personally reviewed by me showing: HR : 86 Rhythm:Sinus rhythm Prolonged PR interval Low voltage, extremity leads Nonspecific T abnrm, anterolateral leads ST elevation, consider inferior injury Similar to prior QTC 438    The recent clinical data is shown below. Vitals:   03/17/22 2045 03/17/22 2100 03/17/22 2130 03/17/22 2145  BP: 108/75 132/63 (!) 143/58   Pulse: (!) 109 91 85 94  Resp: 19 (!) _0 Temp:      TempSrc:      SpO2: 93% 97% 97% 98%       WBC     Component Value Date/Time   WBC 10.9 (H) 03/17/2022 1621   LYMPHSABS 0.6 (L) 03/17/2022 1621   MONOABS 0.6 03/17/2022 1621   EOSABS 0.0 03/17/2022 1621   BASOSABS 0.0 03/17/2022 1621       _______________________________________________ Hospitalist was called for admission for   Precordial chest pain  Pericardial effusion  Elevated troponin     The following Work up has been ordered so far:  Orders Placed This Encounter  Procedures   DG Chest Port 1 View   Brain natriuretic peptide   Comprehensive metabolic panel   CBC with Differential   Cardiac monitoring   Inpatient consult to Cardiology   Consult to hospitalist   Oxygen therapy   ED EKG   EKG 12-Lead     OTHER Significant initial  Findings:  labs showing:  Recent Labs  Lab 03/12/22 1247 03/17/22 1621  NA 137 133*  K 3.8 4.0  CO2 30 24  GLUCOSE 101*  165*  BUN 36* 33*  CREATININE 1.26* 1.24*  CALCIUM 9.7 9.0    Cr   stable,  Lab Results  Component Value Date   CREATININE 1.24 (H) 03/17/2022   CREATININE 1.26 (H) 03/12/2022   CREATININE 1.28 (H) 01/14/2022  Recent Labs  Lab 03/12/22 1247 03/17/22 1621  AST 24 27  ALT 13 13  ALKPHOS 58 54  BILITOT 0.6 1.0  PROT 7.1 6.9  ALBUMIN 4.3 3.6   Lab Results  Component Value Date   CALCIUM 9.0 03/17/2022   PHOS 4.5 03/29/2021    Plt: Lab Results  Component Value Date   PLT 215 03/17/2022    ABG    Component Value Date/Time   HCO3 26.1 03/29/2021 0534   TCO2 23 06/15/2012 1158   O2SAT 97.1 03/29/2021 0534       Recent Labs  Lab 03/12/22 1247 03/17/22 1621  WBC 7.6 10.9*  NEUTROABS 6.1 9.7*  HGB 11.9* 11.0*  HCT 36.0 32.8*  MCV 108.5* 107.9*  PLT 208.0 215    HG/HCT   stable,     Component Value Date/Time   HGB 11.0 (L) 03/17/2022 1621   HCT 32.8 (L) 03/17/2022 1621   MCV 107.9 (H) 03/17/2022 1621     Cardiac Panel (last 3 results) No results for input(s): CKTOTAL, CKMB, TROPONINI, RELINDX in the last 72 hours.  .car BNP (last 3 results) Recent Labs    03/28/21 1356 03/17/22 1622  BNP 2,011.1* 2,025.3*         Cultures:    Component Value Date/Time   SDES SPUTUM 12/21/2015 2300   SPECREQUEST Normal 12/21/2015 2300   REPTSTATUS 12/22/2015 FINAL 12/21/2015 2300     Radiological Exams on Admission: DG Chest Port 1 View  Result Date: 03/17/2022 CLINICAL DATA:  Shortness of breath.  Weakness. EXAM: PORTABLE CHEST 1 VIEW COMPARISON:  03/30/2021 FINDINGS: Marked cardiac enlargement is stable. Diffuse pulmonary interstitial prominence also stable. Infiltrate or atelectasis in left lower lobe also shows no significant change. IMPRESSION: Left lobe atelectasis versus infiltrate, without significant change. Stable cardiomegaly and diffuse pulmonary interstitial prominence. Electronically Signed   By: Marlaine Hind M.D.   On: 03/17/2022 17:06    _______________________________________________________________________________________________________ Latest  Blood pressure (!) 143/58, pulse 94, temperature 98.2 F (36.8 C), temperature source Oral, resp. rate 20, SpO2 98 %.   Vitals  labs and radiology finding personally reviewed  Review of Systems:    Pertinent positives include:   fatigue shortness of breath at rest.  Constitutional:  No weight loss, night sweats, Fevers, chills,, weight loss  HEENT:  No headaches, Difficulty swallowing,Tooth/dental problems,Sore throat,  No sneezing, itching, ear ache, nasal congestion, post nasal drip,  Cardio-vascular:  No chest pain, Orthopnea, PND, anasarca, dizziness, palpitations.no Bilateral lower extremity swelling  GI:  No heartburn, indigestion, abdominal pain, nausea, vomiting, diarrhea, change in bowel habits, loss of appetite, melena, blood in stool, hematemesis Resp:  no  No dyspnea on exertion, No excess mucus, no productive cough, No non-productive cough, No coughing up of blood.No change in color of mucus.No wheezing. Skin:  no rash or lesions. No jaundice GU:  no dysuria, change in color of urine, no urgency or frequency. No straining to urinate.  No flank pain.  Musculoskeletal:  No joint pain or no joint swelling. No decreased range of motion. No back pain.  Psych:  No change in mood or affect. No depression or anxiety. No memory loss.  Neuro: no localizing neurological complaints, no tingling, no weakness, no double vision, no gait abnormality, no slurred speech, no confusion  All systems reviewed and apart from [place] all are negative _______________________________________________________________________________________________ Past Medical History:   Past Medical History:  Diagnosis Date   Anemia    Angiodysplasia of stomach and duodenum  Arthus phenomenon    AVM (arteriovenous malformation)    Blood transfusion without reported diagnosis    last 4 units  12-22-15, Iron infusion x2 last -01-07-16,01-14-16.   Breast cancer ([place]) 1989   Left   Candida esophagitis (HCC)    Cataract    Chronic kidney disease    Chronic mild renal insuffiency   CREST syndrome (HCC)    GERD (gastroesophageal reflux disease)    w/ HH   Interstitial lung disease (HCC)    Nodule of kidney    Pericardial effusion    PONV (postoperative nausea and vomiting)    Pulmonary embolus ([place]) 2003   Pulmonary hypertension ([place])    follow[facility] by Dr Gaynell Face at [facility], now Dr. Maryjean Ka visit end 2'17.   Rectal incontinence    Renal cell carcinoma (HCC)    Scleroderma (HCC)    Tubular adenoma of colon    Uterine polyp       Past Surgical History:  Procedure Laterality Date   APPENDECTOMY  1962   BREAST LUMPECTOMY  1989   left   CATARACT EXTRACTION, BILATERAL Bilateral 12/2013   COLONOSCOPY WITH PROPOFOL N/A 04/15/2021   Procedure: COLONOSCOPY WITH PROPOFOL;  Surgeon: Milus Banister, MD;  Location: [facility] ENDOSCOPY;  Service: Endoscopy;  Laterality: N/A;   ENTEROSCOPY N/A 01/18/2016   Procedure: ENTEROSCOPY;  Surgeon: Mauri Pole, MD;  Location: [facility];  Service: Endoscopy;  Laterality: N/A;   ENTEROSCOPY N/A 05/24/2018   Procedure: ENTEROSCOPY;  Surgeon: Jackquline Denmark, MD;  Location: [facility];  Service: Endoscopy;  Laterality: N/A;   ENTEROSCOPY N/A 03/29/2021   Procedure: ENTEROSCOPY;  Surgeon: Jerene Bears, MD;  Location: [facility];  Service: Gastroenterology;  Laterality: N/A;   ENTEROSCOPY N/A 04/13/2021   Procedure: ENTEROSCOPY;  Surgeon: Yetta Flock, MD;  Location: [facility];  Service: Gastroenterology;  Laterality: N/A;   ESOPHAGOGASTRODUODENOSCOPY (EGD) WITH PROPOFOL N/A 12/21/2015   Procedure: ESOPHAGOGASTRODUODENOSCOPY (EGD) WITH PROPOFOL;  Surgeon: Irene Shipper, MD;  Location: [facility];  Service: Endoscopy;  Laterality: N/A;   HOT HEMOSTASIS N/A 05/24/2018   Procedure: HOT HEMOSTASIS (ARGON PLASMA COAGULATION/BICAP);  Surgeon:  Jackquline Denmark, MD;  Location[facility];  Service: Endoscopy;  Laterality: N/A;   HOT HEMOSTASIS N/A 03/29/2021   Procedure: HOT HEMOSTASIS (ARGON PLASMA COAGULATION/BICAP);  Surgeon: Jerene Bears, MD;  Location: [facility];  Service: Gastroenterology;  Laterality: N/A;   HOT HEMOSTASIS N/A 04/13/2021   Procedure: HOT HEMOSTASIS (ARGON PLASMA COAGULATION/BICAP);  Surgeon: Yetta Flock, MD;  Location: [facility];  Service: Gastroenterology;  Laterality: N/A;   IR GENERIC HISTORICAL  06/05/2016   IR RADIOLOGIST EVAL & MGMT 06/05/2016 Aletta Edouard, MD [facility] INTERV RAD   IVC Filter     KIDNEY SURGERY     left -"laser surgery by Dr. Kathlene Cote- 5 yrs ago" no removal   SCHLEROTHERAPY  05/24/2018   Procedure: SCHLEROTHERAPY;  Surgeon: Jackquline Denmark, MD;  [facility]: [facility];  Service: Endoscopy;;   SUBMUCOSAL TATTOO INJECTION  04/13/2021   Procedure: SUBMUCOSAL TATTOO INJECTION;  Surgeon: Yetta Flock, MD;  Location: [facility];  Service: Gastroenterology;;   TONSILLECTOMY     TUBAL LIGATION      Social History:  Ambulatory   independently cane,      reports that she has never smoked. She has never used smokeless tobacco. She reports that she does not drink alcohol and does not use drugs.     Family History:   Family History  Problem Relation Age  of Onset   Bladder Cancer Father    Diabetes Father    Prostate cancer Father    Alzheimer's disease Mother    Diabetes Sister    Lung cancer Sister         smoker   Esophageal cancer Paternal Uncle    Colon cancer Neg Hx    ______________________________________________________________________________________________ Allergies: Allergies  Allergen Reactions   Codeine Nausea Only    Hallucinations, too   Other Nausea And Vomiting    "-mycin" antibiotics.   Also cause hallucinations.   Erythromycin Nausea And Vomiting   Lisinopril     Pt doesn't remember    Mycophenolate Mofetil Nausea Only   Sulfa  Antibiotics Other (See Comments)    unknown     Prior to Admission medications   Medication Sig Start Date End Date Taking? Authorizing Provider  acetaminophen (TYLENOL) 325 MG tablet Take 650 mg by mouth every 6 (six) hours as needed for mild pain.     [provider]  albuterol (VENTOLIN HFA) 108 (90 Base) MCG/ACT inhaler INHALE 2 PUFFS INTO THE LUNGS EVERY 6 HOURS AS NEEDED FOR WHEEZING OR SHORTNESS OF BREATH 03/06/20   Hoyt Koch, MD  ALPRAZolam Duanne Moron) 0.25 MG tablet Take 0.5 tablets (0.125 mg total) by mouth at bedtime as needed. Patient taking differently: Take 0.125 mg by mouth at bedtime as needed for anxiety or sleep. 10/29/20   Hoyt Koch, MD  diltiazem (CARDIZEM CD) 120 MG 24 hr capsule Take 120 mg by mouth daily.    [provider]  fluticasone (FLONASE) 50 MCG/ACT nasal spray INSTILL 1 SPRAY INTO EACH NOSTRIL TWICE A DAY IF NEEDED Patient taking differently: Place 1 spray into both nostrils daily as needed for allergies. 09/18/20   Hoyt Koch, MD  furosemide (LASIX) 20 MG tablet Take 1 tablet by mouth once daily 12/12/21   Hoyt Koch, MD  montelukast (SINGULAIR) 10 MG tablet TAKE 1 TABLET BY MOUTH AT BEDTIME 03/03/22   Hoyt Koch, MD  Mouthwashes (DRY MOUTH MOUTHWASH) LIQD Use as directed 5 mLs in the mouth or throat daily as needed (dry mouth). Biotene 04/01/21   Shelly Coss, MD  Multiple Vitamins-Iron (MULTIVITAMIN/IRON PO) Take 1 tablet by mouth daily.    [provider]  OPSUMIT 10 MG TABS Take 10 mg by mouth daily. 08/16/15   [provider]  OXYGEN Inhale into the lungs. 2 LMP at night and upon exertion    [provider]  pantoprazole (PROTONIX) 40 MG tablet Take 1 tablet (40 mg total) by mouth 2 (two) times daily before a meal. 02/27/22   Nandigam, Venia Minks, MD  spironolactone (ALDACTONE) 25 MG tablet Take 12.5 mg by mouth daily. 06/09/20   [provider]  sucralfate  (CARAFATE) 1 g tablet Take 1 tablet (1 g total) by mouth every 6 (six) hours as needed. 02/07/22   Mauri Pole, MD    ___________________________________________________________________________________________________ Physical Exam:    03/17/2022    9:45 PM 03/17/2022    9:30 PM 03/17/2022    9:00 PM  Vitals with BMI  Systolic  277 824  Diastolic  58 63  Pulse 94 85 91     1. General:  in No  Acute distress    Chronically ill   -appearing 2. Psychological: Alert and  Oriented 3. Head/ENT:   Moist   Mucous Membranes  Head Non traumatic, neck supple                           Poor Dentition 4. SKIN: normal   Skin turgor,  Skin clean Dry and intact no rash 5. Heart: Regular rate and rhythm systolic Murmur, no Rub or gallop 6. Lungs: no wheezes occasional fine  crackles   7. Abdomen: Soft,  non-tender, Non distended   obese  bowel sounds present 8. Lower extremities: no clubbing, cyanosis, no  edema 9. Neurologically Grossly intact, moving all 4 extremities equally   10. MSK: Normal range of motion    Chart has been reviewed  ______________________________________________________________________________________________  Assessment/Plan  86 y.o. 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  Essential hypertension Continue Cardizem and Aldactone for now And 1 dose of Lasix As per cardiology recommendations assess fluid status and blood pressure throughout admission and adjust as needed  Anemia, iron deficiency Chronic stable has gotten iron infusions in the past  Pulmonary hypertension ([place]) Consulted pulmonology will see in consult in a.m.  ILD (interstitial lung disease) (HCC) Crest syndrome continue home meds  CKD (chronic kidney disease) stage 3, GFR 30-59 ml/min (HCC)  -chronic avoid nephrotoxic medications such as NSAIDs, Vanco Zosyn combo,  avoid hypotension, continue to follow renal function   Acute on chronic diastolic CHF (congestive heart failure) ([place]) - Pt diagnosed with CHF based on presence of the following  Cardiomegaly, With noted response to IV diuretic in ER  admit on telemetry,  cycle cardiac enzymes, Troponin 26 30   obtain serial ECG  to evaluate for ischemia as a cause of heart failure  monitor daily weight: There were no vitals filed for this visit. Last BNP BNP (last 3 results) Recent Labs    03/28/21 1356 03/17/22 1622  BNP 2,011.1* 2,025.3*     diurese with IV lasix and monitor orthostatics and creatinine to avoid over diuresis. Cardiology wanted to try 1 dose of Lasix and then later assess in the morning  Order echogram to evaluate EF and valves  ACE/ARBi Contraindicated    cardiology consulted    Elevated troponin Mildly elevation fairly flat cardiology have seen patient in consult does not think that acute ischemia present currently continue to monitor echo in the morning  Chest pain, atypical Multifactorial patient with history of crest syndrome Given pleuritic component order CTA  Other differential includes but not limited to GI symptoms this possibility to explain chest pain as well also  has pericardial effusion.  Obtain echogram in the morning troponin at this point flat.  Continue to monitor continue Carafate pain management as needed Can also do swallow evaluation to see if there is any esophageal dysmotility that  could explain her chest pain   Other plan as per orders.  DVT prophylaxis:  SCD      Code Status:    Code Status: Prior FULL CODE  as per patient   I had personally discussed CODE STATUS with patient      Family Communication:   Family not at  Bedside    Disposition Plan:   To home once workup is complete and patient is stable   Following barriers for discharge:                                                      Will need consultants to evaluate patient prior to discharge   Would benefit from PT/OT eval prior to DC  Ordered                   Swallow eval - SLP ordered                                      Palliative care    consulted                                     Consults called: Cardiology is aware and pulmonology is aware  Admission status:  ED Disposition     ED Disposition  [place]: [place] [[id number]]  Level of Care: Progressive [102]  Admit to Progressive based on following criteria: CARDIOVASCULAR & THORACIC of moderate stability with acute coronary syndrome symptoms/low risk myocardial infarction/hypertensive urgency/arrhythmias/heart failure potentially compromising stability and stable post cardiovascular intervention patients.  May place patient in observation at [facility] or [place] if equivalent level of care is available:: No  Covid Evaluation: Asymptomatic - no recent exposure (last 10 days) testing not required  Diagnosis: Pericardial effusion [213086]  Admitting Physician: Toy Baker [[id number]]  Attending Physician: Toy Baker [[id number]]           Obs      Level of care        progressive tele indefinitely please discontinue once patient no  longer qualifies COVID-19 Labs    Paula Ward 03/17/2022, 11:08 PM    [facility]     after 2 AM please page floor coverage PA If 7AM-7PM, please contact the day team taking care of the patient using [email or web address]   Patient was evaluated in the context of the global COVID-19 pandemic, which necessitated consideration that the patient might be at risk for infection with the SARS-CoV-2 virus that causes COVID-19. Institutional protocols and algorithms that pertain to the evaluation of patients at risk for COVID-19 are in a state of rapid change based on information released by regulatory bodies including the CDC and federal and state organizations. These policies and algorithms were followed during the patient's care.

## 2022-03-17 NOTE — ED Notes (Signed)
Phone Handoff Report given to [employer]

## 2022-03-17 NOTE — ED Notes (Signed)
RN placed pt on 2L . Pts O2 stat was 87% RA after walking to bathroom.

## 2022-03-17 NOTE — Assessment & Plan Note (Signed)
Mildly elevation fairly flat cardiology have seen patient in consult does not think that acute ischemia present currently continue to monitor echo in the morning

## 2022-03-17 NOTE — Consult Note (Addendum)
Cardiology Consultation:   Patient ID: Paula Ward MRN: [id number]; DOB: 1936/08/22  Admit date: 03/17/2022 Date of Consult: 03/17/2022  PCP:  Hoyt Koch, MD   [facility] Providers Cardiologist:  Kirk Ruths, MD        Patient Profile:   Paula Ward is a 86 y.o. 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  History of Present Illness:   Ms. Grieves developed L and central chest pain, associated w/ SOB. She went to [facility] and were evaluated in the ER.  Some worsening of her CP w/ deep inspiration, +SOB, no N&V.   Pt had onset of mid-epigastric pain that went up in her chest yesterday evening. A little worse w/ deep inspiration, a little SOB, no N&V.   When her sx did not improve, she went to [facility].   She has not had any meds, her pain has not changed since it started.   She had run out of her Dilt for a couple of days, her SBP was as high as 170.   Other than that, no recent illnesses or problems, no change in other symptoms.    Past Medical History:  Diagnosis Date   Anemia    Angiodysplasia of stomach and duodenum    Arthus phenomenon    AVM (arteriovenous malformation)    Blood transfusion without reported diagnosis    last 4 units 12-22-15, Iron infusion x2 last -01-07-16,01-14-16.   Breast cancer ([place]) 1989   Left   Candida esophagitis (HCC)    Cataract    Chronic kidney disease    Chronic mild renal insuffiency   CREST syndrome (HCC)    GERD (gastroesophageal reflux disease)    w/ HH   Interstitial lung disease (HCC)    Nodule of kidney    Pericardial effusion    PONV (postoperative nausea and vomiting)     Pulmonary embolus ([place]) 2003   Pulmonary hypertension ([place])    followed by Dr Gaynell Face at [facility], now Dr. Maryjean Ka visit end 2'17.   Rectal incontinence    Renal cell carcinoma (HCC)    Scleroderma (HCC)    Tubular adenoma of colon    Uterine polyp     Past Surgical History:  Procedure Laterality Date   APPENDECTOMY  1962   BREAST LUMPECTOMY  1989   left   CATARACT EXTRACTION, BILATERAL Bilateral 12/2013   COLONOSCOPY WITH PROPOFOL N/A 04/15/2021   Procedure: COLONOSCOPY WITH PROPOFOL;  Surgeon: Milus Banister, MD;  Location: [facility];  Service: Endoscopy;  Laterality: N/A;   ENTEROSCOPY N/A 01/18/2016   Procedure: ENTEROSCOPY;  Surgeon: Mauri Pole, MD;  Location: [facility];  Service: Endoscopy;  Laterality: N/A;   ENTEROSCOPY N/A 05/24/2018   Procedure: ENTEROSCOPY;  Surgeon: Jackquline Denmark, MD;  [facility]: [facility];  Service: Endoscopy;  Laterality: N/A;   ENTEROSCOPY N/A 03/29/2021   Procedure: ENTEROSCOPY;  Surgeon: Jerene Bears, MD;  [facility]: [facility];  Service: Gastroenterology;  Laterality: N/A;   ENTEROSCOPY N/A 04/13/2021   Procedure: ENTEROSCOPY;  Surgeon: Yetta Flock, MD;  [facility]: [facility];  Service: Gastroenterology;  Laterality: N/A;   ESOPHAGOGASTRODUODENOSCOPY (EGD) WITH PROPOFOL N/A 12/21/2015  Procedure: ESOPHAGOGASTRODUODENOSCOPY (EGD) WITH PROPOFOL;  Surgeon: Irene Shipper, MD;  [facility];  Service: Endoscopy;  Laterality: N/A;   HOT HEMOSTASIS N/A 05/24/2018   Procedure: HOT HEMOSTASIS (ARGON PLASMA COAGULATION/BICAP);  Surgeon: Jackquline Denmark, MD;  [facility];  Service: Endoscopy;  Laterality: N/A;   HOT HEMOSTASIS N/A 03/29/2021   Procedure: HOT HEMOSTASIS (ARGON PLASMA COAGULATION/BICAP);  Surgeon: Jerene Bears, MD;  [facility];  Service: Gastroenterology;  Laterality: N/A;   HOT HEMOSTASIS N/A 04/13/2021   Procedure: HOT HEMOSTASIS (ARGON PLASMA COAGULATION/BICAP);  Surgeon: Yetta Flock, MD;  [facility];  Service: Gastroenterology;  Laterality: N/A;   IR GENERIC HISTORICAL  06/05/2016   IR RADIOLOGIST EVAL & MGMT 06/05/2016 Aletta Edouard, MD [facility] INTERV RAD   IVC Filter     KIDNEY SURGERY     left -"laser surgery by Dr. Kathlene Cote- 5 yrs ago" no removal   SCHLEROTHERAPY  05/24/2018   Procedure: SCHLEROTHERAPY;  Surgeon: Jackquline Denmark, MD;  [facility];  Service: Endoscopy;;   SUBMUCOSAL TATTOO INJECTION  04/13/2021   Procedure: SUBMUCOSAL TATTOO INJECTION;  Surgeon: Yetta Flock, MD;  [facility];  Service: Gastroenterology;;   TONSILLECTOMY     TUBAL LIGATION       Home Medications:  Prior to Admission medications   Medication Sig Start Date End Date Taking? Authorizing Provider  acetaminophen (TYLENOL) 325 MG tablet Take 650 mg by mouth every 6 (six) hours as needed for mild pain.     [provider]  albuterol (VENTOLIN HFA) 108 (90 Base) MCG/ACT inhaler INHALE 2 PUFFS INTO THE LUNGS EVERY 6 HOURS AS NEEDED FOR WHEEZING OR SHORTNESS OF BREATH 03/06/20   Hoyt Koch, MD  ALPRAZolam Duanne Moron) 0.25 MG tablet Take 0.5 tablets (0.125 mg total) by mouth at bedtime as needed. Patient taking differently: Take 0.125 mg by mouth at bedtime as needed for anxiety or sleep. 10/29/20   Hoyt Koch, MD  diltiazem (CARDIZEM CD) 120 MG 24 hr capsule Take 120 mg by mouth daily.    [provider]  fluticasone (FLONASE) 50 MCG/ACT nasal spray INSTILL 1 SPRAY INTO EACH NOSTRIL TWICE A DAY IF NEEDED Patient taking differently: Place 1 spray into both nostrils daily as needed for allergies. 09/18/20   Hoyt Koch, MD  furosemide (LASIX) 20 MG tablet Take 1 tablet by mouth once daily 12/12/21   Hoyt Koch, MD  montelukast (SINGULAIR) 10 MG tablet TAKE 1 TABLET BY MOUTH AT BEDTIME 03/03/22   Hoyt Koch, MD  Mouthwashes (DRY MOUTH MOUTHWASH) LIQD Use as directed 5 mLs in the mouth or throat  daily as needed (dry mouth). Biotene 04/01/21   Shelly Coss, MD  Multiple Vitamins-Iron (MULTIVITAMIN/IRON PO) Take 1 tablet by mouth daily.    [provider]  OPSUMIT 10 MG TABS Take 10 mg by mouth daily. 08/16/15   [provider]  OXYGEN Inhale into the lungs. 2 LMP at night and upon exertion    [provider]  pantoprazole (PROTONIX) 40 MG tablet Take 1 tablet (40 mg total) by mouth 2 (two) times daily before a meal. 02/27/22   Nandigam, Venia Minks, MD  spironolactone (ALDACTONE) 25 MG tablet Take 12.5 mg by mouth daily. 06/09/20   [provider]  sucralfate (CARAFATE) 1 g tablet Take 1 tablet (1 g total) by mouth every 6 (six) hours as needed. 02/07/22   Mauri Pole, MD    Inpatient Medications: Scheduled Meds:  Continuous Infusions:  PRN Meds:   Allergies:    Allergies  Allergen Reactions   Codeine Nausea Only    Hallucinations, too   Other Nausea And Vomiting    "-mycin" antibiotics.   Also cause hallucinations.   Erythromycin Nausea And Vomiting   Lisinopril     Pt doesn't remember    Mycophenolate Mofetil Nausea Only   Sulfa Antibiotics Other (See Comments)    unknown    Social History:   Social History   Socioeconomic History   Marital status: Married    Spouse name: Not on file   Number of children: 2   Years of education: Not on file   Highest education level: Not on file  Occupational History   Occupation: Retired  Tobacco Use   Smoking status: Never   Smokeless tobacco: Never  Vaping Use   Vaping Use: Never used  Substance and Sexual Activity   Alcohol use: No   Drug use: No   Sexual activity: Not Currently  Other Topics Concern   Not on file  Social History Narrative   Married '611 son- '65, 1 daughter '63; 6 children (2 adopted)SO- SOBRetirement- doing well. Marriage in good health. Patient has never smoked. Alcohol use- noPt gets regular exercise   Social Determinants of Health   Financial  Resource Strain: Not on file  Food Insecurity: Not on file  Transportation Needs: Not on file  Physical Activity: Not on file  Stress: Not on file  Social Connections: Not on file  Intimate Partner Violence: Not on file    Family History:   Family History  Problem Relation Age of Onset   Bladder Cancer Father    Diabetes Father    Prostate cancer Father    Alzheimer's disease Mother    Diabetes Sister    Lung cancer Sister         smoker   Esophageal cancer Paternal Uncle    Colon cancer Neg Hx      ROS:  Please see the history of present illness.  All other ROS reviewed and negative.     Physical Exam/Data:   Vitals:   03/17/22 1821 03/17/22 1830 03/17/22 1845 03/17/22 1915  BP:  (!) 128/56 (!) 126/52 (!) 144/60  Pulse:  87 90 92  Resp:  (!) 23 (!) 27 (!) 27  Temp: 98.2 F (36.8 C)     TempSrc: Oral     SpO2:  93% 93% 99%   No intake or output data in the 24 hours ending 03/17/22 1931    03/04/2022   12:11 PM 02/18/2022   12:11 PM 02/10/2022    2:10 PM  Last 3 Weights  Weight (lbs) 104 lb 15 oz 104 lb 0.9 oz 106 lb  Weight (kg) 47.6 kg 47.2 kg 48.081 kg     There is no height or weight on file to calculate BMI.  General:  Thin, frail, female, mildly uncomfortable HEENT: normal Neck:  JVD 9 cm Vascular: rad murmur to carotids; Distal pulses 1-2+ bilaterally Cardiac:  normal S1, S2; RRR; 3/6 murmur  Lungs:  rales bilaterally, no wheezing, rhonchi    Abd: soft, nontender, no hepatomegaly  Ext: no edema Musculoskeletal:  No deformities, BUE and BLE strength weak but equal Skin: warm and dry, reddish blotches are chronic, no recent change Neuro:  CNs 2-12 intact, no focal abnormalities noted Psych:  Normal affect   EKG:  The EKG was personally reviewed and demonstrates:  SR, HR 86, ST changes  inf leads similar to 11/19/2021 ECG Telemetry:  Telemetry was personally reviewed and demonstrates:  SR  Relevant CV Studies:  ECHO: 10/29/2021 2D DIMENSIONS  AORTA           Values     Normal RangeMAIN PA      Values     Normal Range       Annulus:  nm*  cm    [1.9 - 2.7]    PA Main:  nm*  cm    [1.5 - 2.1]     Aorta Sin:   3.1 cm    [2.4 - 3.6] RIGHT VENTRICLE   ST Junction:  nm*  cm    [2 - 3.2]      RV Base:   4.1 cm    [2.5 - 4.1]     Asc.Aorta:   3.7 cm    [1.9 - 3.5]     RV Mid:   3.8 cm    [1.9 - 3.5]  LEFT VENTRICLE                         RV Length:  nm*  cm    [  ]         LVIDd:   3.8 cm    [3.7 - 5.3] RIGHT ATRIUM         LVIDs:   1.6 cm    [2.2 - 3.4]    RA Area:  17   cm2   [ <= 20]        LVEDVi:  63.0 ml/m2 [29 - 61]         RAVi:  30   ml/m2 [15 - 27]        LVESVi:  31.0 ml/m2 [8 - 24]    INFERIOR VENA CAVA            FS:  58   %     [ >= 25]       Max.IVC:   1.5 cm    [ <= 2.1]           SWT:   1.6 cm    [0.6 - 0.9]    Min.IVC:  nm*  cm    [ <= 1.7]           PWT:   1.2 cm    [0.6 - 0.9] __________________  LEFT ATRIUM                           nm* - not measured       LA Diam:   4.3 cm    [2.7 - 3.8]       LA Area:  31   cm2   [ <= 20]     LA Volume: 105   ml    [22 - 52]          LAVi:  73   ml/m2 [16 - 34]   ECHOCARDIOGRAPHIC DESCRIPTIONS -----------------------------------------------  AORTIC ROOT          Size: DILATED    Dissection: INDETERM FOR DISSECTION   AORTIC VALVE      Leaflets: Tricuspid             Morphology: MODERATELY THICKENED      Mobility: PARTIALLY MOBILE   LEFT VENTRICLE  Anterior: Normal          Size: Normal                                 Lateral: Normal   Contraction: Normal                                  Septal: Normal    Closest EF: >55%(Estimated)  Calc.EF: 60% (3D)      Apical: Normal     LV masses: No Masses                             Inferior: Normal           LVH: MILD LVH CONCENTRIC                  Posterior: Normal   LV GLS(GE): -15.4% Normal Range [ <= -16]  Dias.FxClass: N/A   MITRAL VALVE      Leaflets: ABNORMAL                Mobility: PARTIALLY  MOBILE    Morphology: THICKENED LEAFLET(S)   LEFT ATRIUM          Size: MODERATELY ENLARGED     LA masses: No masses                Normal IAS   MAIN PA          Size: Normal   PULMONIC VALVE    Morphology: Normal      Mobility: Fully Mobile   RIGHT VENTRICLE          Size: Normal                    Free wall: Normal   Contraction: Normal                    RV masses: No Masses         TAPSE:   2.5 cm,  Normal Range [>= 1.6 cm]       RV Note: ANNULAR VELOCITY = 15 cm/SEC   TRICUSPID VALVE      Leaflets: Normal                  Mobility: Fully mobile    Morphology: Normal   RIGHT ATRIUM          Size: Normal                     RA Other: None     RA masses: No masses   PERICARDIUM         Fluid: LARGE EFFUSION  UNIFORM   INFERIOR VENACAVA          Size: Normal     Normal respiratory collapse   DOPPLER ECHO and OTHER SPECIAL PROCEDURES ------------------------------------     Aortic: No AR                  MODERATE AS      3.6 m/s peak vel   52 mmHg peak grad  28 mmHg mean grad 0.8 cm2 by DOPPLER   LVOT Diam: 2.0 cm. Resting LVOT Vel: 1.2 m/s. Dimensionless Index: 0.27      Mitral: TRIVIAL MR  MODERATE MS      1.7 m/s peak vel   12 mmHg peak grad   6 mmHg mean grad     MV Inflow E Vel.= nm* cm/s  MV Annulus E'Vel.= nm* cm/s  E/E'Ratio= nm*   Tricuspid: TRIVIAL TR             No TS             3.4 m/s peak TR vel   46 mmHg peak RV pressure   Pulmonary: TRIVIAL PR             No PS       Other:   INTERPRETATION ---------------------------------------------------------------    NORMAL LEFT VENTRICULAR SYSTOLIC FUNCTION WITH MILD LVH    NORMAL RIGHT VENTRICULAR SYSTOLIC FUNCTION    VALVULAR REGURGITATION: TRIVIAL MR, TRIVIAL PR, TRIVIAL TR    VALVULAR STENOSIS: MODERATE AS, MODERATE MS    LARGE PERICARDIAL EFFUSION (See above)    THERE IS LARGE CIRCUMFERENTIAL PERICARDIAL EFFUSION. THERE IS NO ECHO    EVIDENCE OF CARDIAC TAMPONADE.    3D acquisition and  reconstructions were performed as part of this    examination to more accurately quantify the effects of identified    structural abnormalities as part of the exam.     Compared with prior Echo study on 02/28/2021: NO SIGNIFICANT CHANGES.   ECHO: 09/08/2018 - Left ventricle: The cavity size was normal. Wall thickness was    increased in a pattern of moderate LVH. Systolic function was    vigorous. The estimated ejection fraction was in the range of 65%    to 70%. Wall motion was normal; there were no regional wall    motion abnormalities. Doppler parameters are consistent with    abnormal left ventricular relaxation (grade 1 diastolic    dysfunction). GLS: -23.5%  - Mitral valve: Moderately calcified annulus.  - Left atrium: The atrium was severely dilated. Volume/bsa, ES    (1-plane Simpson&'s, A4C): 76.4 ml/m^2.  - Pericardium, extracardiac: A large, free-flowing pericardial    effusion was identified circumferential to the heart. There was    no evidence of hemodynamic compromise.   Impressions:   - Compared to the prior study, there has been no significant    interval change.  R HEART CATH: 11/22/2021 [facility]  Elderly lady with longstanding PAH associated with scleroderma and had been well compensated for this  In recent years some increase in mitral stenosis, and ILD and longstanding pericardial effusion  Cath performed on baseline PH med of maciitentan  RA 7, RV 50/8, PA 50/21 mean 32, wedge 21  Cardiac output is 6 and index of 4.1 L/min/m2.  PVR is 1.8 WU  and SVR of 10.7 WU with NIBP 141/54 mean 71   Recommendations:   Chronic diastolic dysfunction and elevated left sided filling pressure with the mitral stenosis playing some role  No indication for further pulmonary vasodilator.  Data does not suggest that effusion is due to the [facility]: Lambert Rhonda W with low RA pressure. No evidence for tamponade.  R/L CARDIAC CATH: 08/18/2002 FINDINGS:  1. The left main coronary artery was normal.  2.  The left anterior descending artery was normal.  3. The circumflex coronary artery was normal.  4. The right coronary artery was dominant and normal.  5. RA ventriculography:  RAO ventriculography showed abnormal septal motion     with mild global hypokinesis.  EF was 55%.  There was no MR.  6. Aortic pressure was 142/93.  7. LV  pressure was 144/11.  8. Mean right atrial pressure was 24 mmHg.  9. RV pressure was 75/11 with a post A wave EDP of 26.  10.      PA pressure was 79/35 with a mean of 53.  11.      Mean pulmonary capillary wedge pressure was 16.  12.      PAD was significantly higher than pulmonary capillary wedge     pressure indicating intrinsic lung disease.  13.      The patient's PA systolic pressure was greater than one-half her     systemic systolic pressure indicating severe pulmonary hypertension as     well.  14.      PA saturation was 69.6.  Arterial saturation was 95.5.  15.      Cardiac output was 4.1 with a cardiac index of 2.2.  16.      Calculated pulmonary vascular resistance was extremely elevated at 722.    IMPRESSION:  The patient would appear to have significant primary pulmonary  hypertension.  This may be related to her radiation therapy, CREST syndrome,  or idiopathic.  She has no significant left-sided disease or coronary artery  disease.  She will have her Coumadin resumed in hospital.  She will need  home oxygen.  I suspect digoxin for RV inotropy may benefit her.  She will  then follow up with Dr. Melvyn Novas and  Dr. Gaynell Face.  She will be a candidate for DeSotan and/or Flolan.    I suspect that the prostacyclin and endothelin vasodilators would be the  only drugs that would benefit her dyspnea significantly.    Further recommendations would be coming from the pulmonary division.    Laboratory Data:  High Sensitivity Troponin:   Recent Labs  Lab 03/17/22 1623 03/17/22 1811  TROPONINIHS 30* 26*     Chemistry Recent Labs  Lab 03/12/22 1247  03/17/22 1621  NA 137 133*  K 3.8 4.0  CL 98 98  CO2 30 24  GLUCOSE 101* 165*  BUN 36* 33*  CREATININE 1.26* 1.24*  CALCIUM 9.7 9.0  GFRNONAA  --  43*  ANIONGAP  --  11    Recent Labs  Lab 03/12/22 1247 03/17/22 1621  PROT 7.1 6.9  ALBUMIN 4.3 3.6  AST 24 27  ALT 13 13  ALKPHOS 58 54  BILITOT 0.6 1.0   Lipids No results for input(s): CHOL, TRIG, HDL, LABVLDL, LDLCALC, CHOLHDL in the last 168 hours.  Hematology Recent Labs  Lab 03/12/22 1247 03/17/22 1621  WBC 7.6 10.9*  RBC 3.32* 3.04*  HGB 11.9* 11.0*  HCT 36.0 32.8*  MCV 108.5* 107.9*  MCH  --  36.2*  MCHC 33.0 33.5  RDW 14.2 13.5  PLT 208.0 215   Thyroid No results for input(s): TSH, FREET4 in the last 168 hours.  BNP Recent Labs  Lab 03/17/22 1622  BNP 2,025.3*    DDimer No results for input(s): DDIMER in the last 168 hours.   Radiology/Studies:  DG Chest Port 1 View  Result Date: 03/17/2022 CLINICAL DATA:  Shortness of breath.  Weakness. EXAM: PORTABLE CHEST 1 VIEW COMPARISON:  03/30/2021 FINDINGS: Marked cardiac enlargement is stable. Diffuse pulmonary interstitial prominence also stable. Infiltrate or atelectasis in left lower lobe also shows no significant change. IMPRESSION: Left lobe atelectasis versus infiltrate, without significant change. Stable cardiomegaly and diffuse pulmonary interstitial prominence. Electronically Signed   By: Marlaine Hind M.D.   On: 03/17/2022 17:06     Assessment and  Plan:    SignedRosaria Ferries, PA-C  03/17/2022 7:31 PM  Patient seen and examined   I agree with findings as noted above by R Barrett   PT is an 86 yo with hx of CREST, diastolic CHF, Pulmonary HTN (  RHC at [facility] in 2023:  RA 7  RV 50/8  PA 50/21   PCWP 21.  PVR 1.8  SVR 10.7  CI 4.1   Impr:  Chronic diastolic heart dysfunction No indication for pulm vasodilator), chronic pericardial effusion, Moderate AS, moderate MS, PE(IVC filter), GIB (AVMs).  Normal coronary arteries in 2003  The pt just  graduated from pulmonary rehab on Thurs 03/13/22.   Walked over 1400 feet without O2   Says she felt good,  has been feeling good.     Thursday night went out to [place] for dinner   Then Friday didn't feel good   Weak.   Giving out   Some intermittent chest pressure, not associated with activity, +/- pleuritic     Same on Saturday   DId minimal activity  Did not go out     No dizziness, No PND (sleeps on wedge due to reflux)     Wt about 104-105   Relatively steady       Today Felt the same   Came in to ED    NOte the patient took 1 whole aldactone today (usually takes 1/2)    Took usual 20 mg lasix    On exam, Pt is a thin 86 yo in NAD  Neck   JVP is increased.No bruits Lungs are rel clear  Cardiac exam   RRR  Gr III/VI sysotlic murmur at base   No definite diasotlic murmsu  Chest   Nontender   Abd  No hepatomegaly      Ext without edema   2+ pulses   EKG shows SR   Sl ST elevation (concave) in the inferior leads   Present on previous EKG  Trop 30  BNP 2025   BNP 6985 at [facility] Jan 2023    Impression     Chest pressure   Intermittent  Started Friday  Was doing good prior.  Chest discomfort is not exacerbated by activity   NOte she was using O2 more over the weekend   ON exam JVP is mildly increased    AS murmur audible, diastolic murmur not appreciated   I am not convinced of active coronary ischemia ? Increased filling pressures with AS/MS     I would recomm 1 dose of IV lasix      Follow strict I/O symptoms, renal function Reassess in am Order echo, Compare to echo done at [facility] this spring  AGain, I am not convinced of active coronary ischemia   EKG changes old   There is trivial elevation of troponin   30, 26 Follow response to diuretic      2  Aortic stenosis   Moderate on echo at [facility]   Dimensionless index 0.27  Mean gradient 28        3  MS  Mod with mean gradient 6 mm Hg on recent echo  4  Pericardial effusion   Large  Chronic      Wll get echo here to reassess above  problems, confirm LVEF and RVEF are still normal    5 Jx CREST  Followed in pulmonary   Finished pulmonary rehab    6  GI   Hx of GI  bleeding , AVM   Has gotten Fe injections    7  Renal   BUN / Cr 33/1.24    Reassess in am     Followed in renal clnic as outpt  8  ANemia     H/H  11/32.8   MCV 109  MCHC 36  Follow     Dorris Carnes MD    For questions or updates, please contact [place] Please consult [email or web address] for contact info under

## 2022-03-18 ENCOUNTER — Other Ambulatory Visit (HOSPITAL_COMMUNITY): Payer: Self-pay

## 2022-03-18 ENCOUNTER — Observation Stay (HOSPITAL_COMMUNITY): Payer: Medicare PPO

## 2022-03-18 ENCOUNTER — Other Ambulatory Visit: Payer: Self-pay

## 2022-03-18 DIAGNOSIS — J9621 Acute and chronic respiratory failure with hypoxia: Secondary | ICD-10-CM | POA: Diagnosis present

## 2022-03-18 DIAGNOSIS — R778 Other specified abnormalities of plasma proteins: Secondary | ICD-10-CM | POA: Diagnosis not present

## 2022-03-18 DIAGNOSIS — Z7189 Other specified counseling: Secondary | ICD-10-CM | POA: Diagnosis not present

## 2022-03-18 DIAGNOSIS — Y92239 Unspecified place in hospital as the place of occurrence of the external cause: Secondary | ICD-10-CM | POA: Diagnosis not present

## 2022-03-18 DIAGNOSIS — Z95828 Presence of other vascular implants and grafts: Secondary | ICD-10-CM | POA: Diagnosis not present

## 2022-03-18 DIAGNOSIS — Z681 Body mass index (BMI) 19 or less, adult: Secondary | ICD-10-CM | POA: Diagnosis not present

## 2022-03-18 DIAGNOSIS — N1832 Chronic kidney disease, stage 3b: Secondary | ICD-10-CM | POA: Diagnosis not present

## 2022-03-18 DIAGNOSIS — L8990 Pressure ulcer of unspecified site, unspecified stage: Secondary | ICD-10-CM | POA: Insufficient documentation

## 2022-03-18 DIAGNOSIS — Z885 Allergy status to narcotic agent status: Secondary | ICD-10-CM | POA: Diagnosis not present

## 2022-03-18 DIAGNOSIS — J189 Pneumonia, unspecified organism: Secondary | ICD-10-CM | POA: Diagnosis present

## 2022-03-18 DIAGNOSIS — N1831 Chronic kidney disease, stage 3a: Secondary | ICD-10-CM | POA: Diagnosis present

## 2022-03-18 DIAGNOSIS — K219 Gastro-esophageal reflux disease without esophagitis: Secondary | ICD-10-CM | POA: Diagnosis present

## 2022-03-18 DIAGNOSIS — I5033 Acute on chronic diastolic (congestive) heart failure: Secondary | ICD-10-CM | POA: Diagnosis present

## 2022-03-18 DIAGNOSIS — Z85528 Personal history of other malignant neoplasm of kidney: Secondary | ICD-10-CM | POA: Diagnosis not present

## 2022-03-18 DIAGNOSIS — Z515 Encounter for palliative care: Secondary | ICD-10-CM

## 2022-03-18 DIAGNOSIS — I3139 Other pericardial effusion (noninflammatory): Secondary | ICD-10-CM

## 2022-03-18 DIAGNOSIS — Z853 Personal history of malignant neoplasm of breast: Secondary | ICD-10-CM | POA: Diagnosis not present

## 2022-03-18 DIAGNOSIS — J90 Pleural effusion, not elsewhere classified: Secondary | ICD-10-CM | POA: Diagnosis not present

## 2022-03-18 DIAGNOSIS — N179 Acute kidney failure, unspecified: Secondary | ICD-10-CM | POA: Diagnosis not present

## 2022-03-18 DIAGNOSIS — Z86711 Personal history of pulmonary embolism: Secondary | ICD-10-CM | POA: Diagnosis not present

## 2022-03-18 DIAGNOSIS — I5030 Unspecified diastolic (congestive) heart failure: Secondary | ICD-10-CM | POA: Diagnosis not present

## 2022-03-18 DIAGNOSIS — Z888 Allergy status to other drugs, medicaments and biological substances status: Secondary | ICD-10-CM | POA: Diagnosis not present

## 2022-03-18 DIAGNOSIS — Z881 Allergy status to other antibiotic agents status: Secondary | ICD-10-CM | POA: Diagnosis not present

## 2022-03-18 DIAGNOSIS — J849 Interstitial pulmonary disease, unspecified: Secondary | ICD-10-CM | POA: Diagnosis not present

## 2022-03-18 DIAGNOSIS — I080 Rheumatic disorders of both mitral and aortic valves: Secondary | ICD-10-CM | POA: Diagnosis present

## 2022-03-18 DIAGNOSIS — D509 Iron deficiency anemia, unspecified: Secondary | ICD-10-CM | POA: Diagnosis present

## 2022-03-18 DIAGNOSIS — Y92009 Unspecified place in unspecified non-institutional (private) residence as the place of occurrence of the external cause: Secondary | ICD-10-CM | POA: Diagnosis not present

## 2022-03-18 DIAGNOSIS — I2721 Secondary pulmonary arterial hypertension: Secondary | ICD-10-CM | POA: Diagnosis present

## 2022-03-18 DIAGNOSIS — M341 CR(E)ST syndrome: Secondary | ICD-10-CM | POA: Diagnosis present

## 2022-03-18 DIAGNOSIS — E43 Unspecified severe protein-calorie malnutrition: Secondary | ICD-10-CM | POA: Diagnosis present

## 2022-03-18 DIAGNOSIS — I130 Hypertensive heart and chronic kidney disease with heart failure and stage 1 through stage 4 chronic kidney disease, or unspecified chronic kidney disease: Secondary | ICD-10-CM | POA: Diagnosis present

## 2022-03-18 DIAGNOSIS — R072 Precordial pain: Secondary | ICD-10-CM | POA: Diagnosis present

## 2022-03-18 DIAGNOSIS — L89311 Pressure ulcer of right buttock, stage 1: Secondary | ICD-10-CM | POA: Diagnosis present

## 2022-03-18 DIAGNOSIS — I272 Pulmonary hypertension, unspecified: Secondary | ICD-10-CM | POA: Diagnosis not present

## 2022-03-18 DIAGNOSIS — I2723 Pulmonary hypertension due to lung diseases and hypoxia: Secondary | ICD-10-CM | POA: Diagnosis present

## 2022-03-18 LAB — ECHOCARDIOGRAM COMPLETE
AR max vel: 0.77 cm2
AV Area VTI: 0.77 cm2
AV Area mean vel: 0.83 cm2
AV Mean grad: 26 mmHg
AV Peak grad: 61.2 mmHg
Ao pk vel: 3.91 m/s
Area-P 1/2: 3.45 cm2
Calc EF: 58.8 %
Height: 63 in
MV VTI: 1.29 cm2
S' Lateral: 2 cm
Single Plane A2C EF: 58 %
Single Plane A4C EF: 56.5 %
Weight: 1636.8 oz

## 2022-03-18 LAB — HEPATIC FUNCTION PANEL
ALT: 13 U/L (ref 0–44)
ALT: 12 U/L (ref 0–44)
AST: 24 U/L (ref 15–41)
AST: 23 U/L (ref 15–41)
Albumin: 3.4 g/dL — ABNORMAL LOW (ref 3.5–5.0)
Albumin: 3.3 g/dL — ABNORMAL LOW (ref 3.5–5.0)
Alkaline Phosphatase: 54 U/L (ref 38–126)
Alkaline Phosphatase: 54 U/L (ref 38–126)
Bilirubin, Direct: 0.2 mg/dL (ref 0.0–0.2)
Bilirubin, Direct: <0.1 mg/dL (ref 0.0–0.2)
Indirect Bilirubin: 0.4 mg/dL (ref 0.3–0.9)
Total Bilirubin: 0.6 mg/dL (ref 0.3–1.2)
Total Bilirubin: 0.3 mg/dL (ref 0.3–1.2)
Total Protein: 6.7 g/dL (ref 6.5–8.1)
Total Protein: 6.7 g/dL (ref 6.5–8.1)

## 2022-03-18 LAB — CBC
HCT: 35.8 % — ABNORMAL LOW (ref 36.0–46.0)
Hemoglobin: 11.5 g/dL — ABNORMAL LOW (ref 12.0–15.0)
MCH: 35.6 pg — ABNORMAL HIGH (ref 26.0–34.0)
MCHC: 32.1 g/dL (ref 30.0–36.0)
MCV: 110.8 fL — ABNORMAL HIGH (ref 80.0–100.0)
Platelets: 244 10*3/uL (ref 150–400)
RBC: 3.23 MIL/uL — ABNORMAL LOW (ref 3.87–5.11)
RDW: 13.5 % (ref 11.5–15.5)
WBC: 9.7 10*3/uL (ref 4.0–10.5)
nRBC: 0 % (ref 0.0–0.2)

## 2022-03-18 LAB — COMPREHENSIVE METABOLIC PANEL
ALT: 14 U/L (ref 0–44)
AST: 25 U/L (ref 15–41)
Albumin: 3.4 g/dL — ABNORMAL LOW (ref 3.5–5.0)
Alkaline Phosphatase: 54 U/L (ref 38–126)
Anion gap: 10 (ref 5–15)
BUN: 31 mg/dL — ABNORMAL HIGH (ref 8–23)
CO2: 23 mmol/L (ref 22–32)
Calcium: 9 mg/dL (ref 8.9–10.3)
Chloride: 99 mmol/L (ref 98–111)
Creatinine, Ser: 1.2 mg/dL — ABNORMAL HIGH (ref 0.44–1.00)
GFR, Estimated: 45 mL/min — ABNORMAL LOW (ref >60)
Glucose, Bld: 107 mg/dL — ABNORMAL HIGH (ref 70–99)
Potassium: 3.9 mmol/L (ref 3.5–5.1)
Sodium: 132 mmol/L — ABNORMAL LOW (ref 135–145)
Total Bilirubin: 0.8 mg/dL (ref 0.3–1.2)
Total Protein: 6.7 g/dL (ref 6.5–8.1)

## 2022-03-18 LAB — MAGNESIUM: Magnesium: 1.9 mg/dL (ref 1.7–2.4)

## 2022-03-18 LAB — CK: Total CK: 56 U/L (ref 38–234)

## 2022-03-18 LAB — MRSA NEXT GEN BY PCR, NASAL: MRSA by PCR Next Gen: NOT DETECTED

## 2022-03-18 LAB — PHOSPHORUS: Phosphorus: 3.3 mg/dL (ref 2.5–4.6)

## 2022-03-18 LAB — PROCALCITONIN: Procalcitonin: 0.52 ng/mL

## 2022-03-18 LAB — POCT HEMOGLOBIN-HEMACUE: Hemoglobin: 11.7 g/dL — ABNORMAL LOW (ref 12.0–15.0)

## 2022-03-18 LAB — STREP PNEUMONIAE URINARY ANTIGEN: Strep Pneumo Urinary Antigen: NEGATIVE

## 2022-03-18 MED ORDER — MACITENTAN 10 MG PO TABS
10.0000 mg | ORAL_TABLET | Freq: Every day | ORAL | Status: DC
  Administered 2022-03-18 – 2022-03-22 (×5): 10 mg via ORAL
  Filled 2022-03-18 (×5): qty 1

## 2022-03-18 MED ORDER — IVABRADINE HCL 5 MG PO TABS
5.0000 mg | ORAL_TABLET | Freq: Once | ORAL | Status: AC | PRN
  Administered 2022-03-19: 5 mg via ORAL
  Filled 2022-03-18: qty 1

## 2022-03-18 MED ORDER — PREDNISONE 20 MG PO TABS
40.0000 mg | ORAL_TABLET | Freq: Every day | ORAL | Status: DC
  Administered 2022-03-18 – 2022-03-19 (×2): 40 mg via ORAL
  Filled 2022-03-18 (×2): qty 2

## 2022-03-18 MED ORDER — SODIUM CHLORIDE 0.9 % IV SOLN
2.0000 g | INTRAVENOUS | Status: DC
  Administered 2022-03-19: 2 g via INTRAVENOUS
  Filled 2022-03-18 (×2): qty 20

## 2022-03-18 MED ORDER — SODIUM CHLORIDE 0.9 % IV SOLN
2.0000 g | Freq: Once | INTRAVENOUS | Status: AC
  Administered 2022-03-18: 2 g via INTRAVENOUS
  Filled 2022-03-18: qty 20

## 2022-03-18 MED ORDER — ENSURE ENLIVE PO LIQD: 237.0000 mL | Freq: Two times a day (BID) | ORAL | Status: DC

## 2022-03-18 MED ORDER — IVABRADINE HCL 5 MG PO TABS
5.0000 mg | ORAL_TABLET | Freq: Once | ORAL | Status: DC
Start: 2022-03-18 — End: 2022-03-18
  Filled 2022-03-18: qty 1

## 2022-03-18 MED ORDER — SODIUM CHLORIDE 0.9 % IV SOLN
100.0000 mg | Freq: Two times a day (BID) | INTRAVENOUS | Status: AC
  Administered 2022-03-18 – 2022-03-19 (×5): 100 mg via INTRAVENOUS
  Filled 2022-03-18 (×6): qty 100

## 2022-03-18 MED ORDER — METOPROLOL TARTRATE 50 MG PO TABS
50.0000 mg | ORAL_TABLET | Freq: Once | ORAL | Status: AC | PRN
  Administered 2022-03-19: 50 mg via ORAL
  Filled 2022-03-18: qty 1

## 2022-03-18 MED ORDER — METOPROLOL TARTRATE 50 MG PO TABS: 50.0000 mg | ORAL_TABLET | Freq: Once | ORAL | Status: DC

## 2022-03-18 NOTE — Evaluation (Signed)
Physical Therapy Evaluation Patient Details Name: Paula Ward MRN: [id number] DOB: 1936-05-20 Today's Date: 03/18/2022  History of Present Illness  86 y.o. female admitted with Atypical chest pain with moderate to severe pericardial effusion with acute on chronic diastolic CHF and PNA.  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 Pt requiring up to min A with higher level balance tasks secondary to LOB. Otherwise requiring min guard for safety. Educated about using cane to help with stability. Feel pt would benefit from outpatient PT to address higher level balance deficits. Will continue to follow acutely.        Recommendations for follow up therapy are one component of a multi-disciplinary discharge planning process, led by the attending physician.  Recommendations may be updated based on patient status, additional functional criteria and insurance authorization.  Follow Up Recommendations Outpatient PT    Assistance Recommended at Discharge Intermittent Supervision/Assistance  Patient can return home with the following  Assistance with cooking/housework    Equipment Recommendations None recommended by PT  Recommendations for Other Services       Functional Status Assessment Patient has had a recent decline in their functional status and demonstrates the ability to make significant improvements in function in a reasonable and predictable amount of time.     Precautions / Restrictions Precautions Precautions: Fall Precaution Comments: monitor HR and O2 Restrictions Weight Bearing  Restrictions: No      Mobility  Bed Mobility Overal bed mobility: Modified Independent                  Transfers Overall transfer level: Needs assistance Equipment used: None Transfers: Sit to/from Stand Sit to Stand: Supervision           General transfer comment: Supervision for safety    Ambulation/Gait Ambulation/Gait assistance: Min guard, Min assist Gait Distance (Feet): 200 Feet Assistive device: None Gait Pattern/deviations: Step-through pattern, Decreased stride length, Drifts right/left Gait velocity: Decreased     General Gait Details: Pt with LOB during horizontal and vertical head turns. Tended to drift R and L as well. Requiring up to min A with LOB. Otherwise requiring min guard. Educated about using cane for increased safety.  Stairs Stairs: Yes Stairs assistance: Min guard Stair Management: One rail Right, Step to pattern, Forwards Number of Stairs: 3 General stair comments: Min guard for safety. No overt LOB noted. when holding to rail.  Wheelchair Mobility    Modified Rankin (Stroke Patients Only)       Balance Overall balance assessment: Needs assistance Sitting-balance support: No upper extremity supported Sitting balance-Leahy Scale: Good     Standing balance support: No upper extremity supported Standing balance-Leahy Scale: Fair                   Standardized Balance Assessment Standardized Balance Assessment : Dynamic Gait Index   Dynamic Gait Index Level Surface: Mild Impairment Gait with Horizontal Head Turns: Severe Impairment Gait with Vertical Head Turns: Severe Impairment Step Over Obstacle: Mild Impairment Steps: Moderate Impairment       Pertinent Vitals/Pain Pain Assessment Pain Assessment: No/denies pain    Home Living Family/patient expects to  be discharged to:: Private residence Living Arrangements: Spouse/significant other Available Help at Discharge: Available 24 hours/day Type of Home:  House Home Access: Stairs to enter Entrance Stairs-Rails: Right Entrance Stairs-Number of Steps: 3   Home Layout: One level Home Equipment: Grab bars - tub/shower;Cane - single Barista (2 wheels);Other (comment) Additional Comments: Home O2 used mostly at night but used sometimes during the day with exertion.    Prior Function Prior Level of Function : Independent/Modified Independent                     Hand Dominance   Dominant Hand: Right    Extremity/Trunk Assessment   Upper Extremity Assessment Upper Extremity Assessment: Defer to OT evaluation    Lower Extremity Assessment Lower Extremity Assessment: Generalized weakness    Cervical / Trunk Assessment Cervical / Trunk Assessment: Kyphotic  Communication   Communication: No difficulties  Cognition Arousal/Alertness: Awake/alert Behavior During Therapy: WFL for tasks assessed/performed Overall Cognitive Status: Within Functional Limits for tasks assessed                                          General Comments General comments (skin integrity, edema, etc.): Pt with no LOB wheh ambulating in the hallway without an assistive device or when completing toilet transfers, or picking items up off of the floor.    Exercises     Assessment/Plan    PT Assessment Patient needs continued PT services  PT Problem List Decreased strength;Decreased balance;Decreased mobility       PT Treatment Interventions Gait training;Stair training;Therapeutic activities;Functional mobility training;Therapeutic exercise;Balance training;Patient/family education    PT Goals (Current goals can be found in the Care Plan section)  Acute Rehab PT Goals Patient Stated Goal: to go home PT Goal Formulation: With patient Time For Goal Achievement: 04/01/22 Potential to Achieve Goals: Good    Frequency Min 2X/week     Co-evaluation               AM-PAC PT "6 Clicks" Mobility  Outcome Measure  Help needed turning from your back to your side while in a flat bed without using bedrails?: None Help needed moving from lying on your back to sitting on the side of a flat bed without using bedrails?: None Help needed moving to and from a bed to a chair (including a wheelchair)?: A Little Help needed standing up from a chair using your arms (e.g., wheelchair or bedside chair)?: A Little Help needed to walk in hospital room?: A Little Help needed climbing 3-5 steps with a railing? : A Little 6 Click Score: 20    End of Session Equipment Utilized During Treatment: Gait belt Activity Tolerance: Patient tolerated treatment well Patient left: in bed;with call bell/phone within reach Nurse Communication: Mobility status PT Visit Diagnosis: Unsteadiness on feet (R26.81)    Time: 7416-3845 PT Time Calculation (min) (ACUTE ONLY): 13 min   Charges:   PT Evaluation $PT Eval Low Complexity: 1 Low          Lou Miner, DPT  Acute Rehabilitation Services  Office: [phone]   Rudean Hitt 03/18/2022, 12:20 PM

## 2022-03-18 NOTE — Assessment & Plan Note (Signed)
CTA showed no PE but possible infection patient does have white blood cell count elevation a typical pneumonia is a possibility.  Obtain blood cultures sputum cultures may benefit from MAC testing Legionella and strep pneumo cover Rocephin doxycycline for now

## 2022-03-18 NOTE — Progress Notes (Signed)
Pharmacy Consult for Pulmonary Hypertension Treatment   Indication - Continuation of prior to admission medication   Patient is 86 y.o.  with history of PAH on chronic Macitentan (Opsumit) PTA and will be continued while hospitalized.   Continuing this medication order as an inpatient requires that monitoring parameters per REMS requirements must be met.  Chronic therapy is under the supervision of Dr. Erin Fulling who is enrolled in the REMS program and is being notified of continuation of therapy. A staff message in EPIC has been sent notifying the certified prescriber.  Per patient report has previously been educated on Hepatotoxicity . On admission pregnancy risk has been assessed and no monitoring required. Hepatic function has been evaluated. AST/ALT appropriate to continue medication at this time.     Latest Ref Rng & Units 03/18/2022    1:47 AM 03/17/2022    4:21 PM 03/12/2022   12:47 PM  Hepatic Function  Total Protein 6.5 - 8.1 g/dL 6.7     6.7   6.9   7.1    Albumin 3.5 - 5.0 g/dL 3.4     3.4   3.6   4.3    AST 15 - 41 U/L _0 ALT 0 - 44 U/L _1 Alk Phosphatase 38 - 126 U/L 54     54   54   58    Total Bilirubin 0.3 - 1.2 mg/dL 0.8     0.6   1.0   0.6    Bilirubin, Direct 0.0 - 0.2 mg/dL 0.2        If any question arise or pregnancy is identified during hospitalization, contact for bosentan and macitentan: [phone]; ambrisentan: [phone].  Thank for you allowing [place] to participate in the care of this patient.   Sherlon Handing, PharmD, BCPS Please see amion for complete clinical pharmacist phone list 03/18/2022 4:45 PM

## 2022-03-18 NOTE — Progress Notes (Addendum)
PROGRESS NOTE                                                                                                                                                                                                             Patient Demographics:    Paula Ward, is a 86 y.o. female, DOB - October 14, 1936, HKV:[id number]  Outpatient Primary MD for the patient is Hoyt Koch, MD    LOS - 0  Admit date - 03/17/2022    Chief Complaint  Patient presents with   Shortness of Breath   Weakness   Chest Pain       Brief Narrative (HPI from H&P)   86 y.o. 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  Cardiology and pulmonary were consulted.   Subjective:    Mickle Plumb today has, No headache, No chest pain, No abdominal pain - No Nausea, No new weakness tingling or numbness, positive chronically productive cough but more short of breath than her baseline.   Assessment  & Plan :   Acute hypoxic respiratory failure due to combination of pneumonia, underlying interstitial lung disease, now moderate to severe pericardial effusion with some element of cute on chronic diastolic CHF last EF in 5643 was 60%. She is currently on 2 L nasal cannula oxygen to maintain pulse ox above 90% on room air she is saturating around 86% with some distress.  For ?  pneumonia she is getting appropriate antibiotics, she has already received IV Lasix for diastolic CHF component.  Have requested cardiology and pulmonary both to evaluate the patient, she may benefit from trial of steroids for ILD exacerbation along with pericardiocentesis if cardiology agrees.  Will defer further management to cardiology and pulmonary.  Continue with supportive care.  Overall stable at this point. Clinically pneumonia  does not seem to be a significant component of her disease process currently.  Most of her acute distress is likely is due to underlying ILD with CHF exacerbation and large pericardial effusion.   Atypical chest pain with moderate to severe pericardial effusion with acute on chronic diastolic CHF last EF 16% .  Chest pain does not sound like ACS troponin trend is flat and in not an ACS pattern, repeat echo ordered to evaluate the extent of pericardial effusion, EF and wall motion.  Received IV Lasix earlier this morning continue to monitor, cardiology called, continue supportive care.  History of crest syndrome, ILD group 3 on nighttime home oxygen, underlying pulmonary hypertension with questionable pneumonia on CT chest.  For now continue antibiotics, pulmonary to evaluate, may need steroids will defer that to pulmonary.    History of GERD and GI bleed due to AVMs.  On PPI.  Stable.  History of moderate aortic stenosis.  No acute issues.  New echo ordered and cardiology consulted.  History of blood clots in the setting of GI bleed.  Has IVC filter.  Stable.  CTA negative.  History of breast and renal cancer.  Supportive care.       Condition - Extremely Guarded  Family Communication  :  none present  Code Status :  Full  Consults  :  Cards, PCCM  PUD Prophylaxis : PPI   Procedures  :     CTA -  1. No evidence for pulmonary embolism. 2. Large pericardial effusion. 3. Mild cardiomegaly. 4. Small bilateral pleural effusions. 5. Bilateral lower lobe  airspace disease compatible with infection. 6. Ground-glass opacities in both lungs favored as edema, although infection is not excluded. Some ground-glass areas are nodular. 7. A follow-up chest CT is recommended in 3 months to re-evaluate and confirm resolution.       Disposition Plan  :    Status is: Observation   DVT Prophylaxis  :    SCDs Start: 03/17/22 2233   Lab Results  Component Value Date   PLT 244 03/18/2022    Diet :  Diet Order             Diet Heart Room service appropriate? Yes; Fluid consistency: Thin  Diet effective now                    Inpatient Medications  Scheduled Meds:  diltiazem  120 mg Oral Daily   montelukast  10 mg Oral QHS   pantoprazole  40 mg Oral BID AC   sodium chloride flush  3 mL Intravenous Q12H   spironolactone  12.5 mg Oral Daily   Continuous Infusions:  sodium chloride     [START ON 03/19/2022] cefTRIAXone (ROCEPHIN)  IV     doxycycline (VIBRAMYCIN) IV Stopped (03/18/22 0431)   PRN Meds:.sodium chloride, acetaminophen **OR** acetaminophen, albuterol, ALPRAZolam, HYDROcodone-acetaminophen, sodium chloride flush, sucralfate  Time Spent in minutes  30   Paula Ward M.D on 03/18/2022 at 7:57 AM  To page go to [email or web address]   [facility] -  Office  [phone]  See all Orders from today for further details    Objective:   Vitals:   03/18/22 0620 03/18/22 0623 03/18/22 0700 03/18/22 0715  BP: (!) 119/55 128/61 (!) 116/52   Pulse: 92 90 87 91  Resp: (!) 27 20 (!) 22 20  Temp:  98.9 F (37.2 C)    TempSrc:  Oral    SpO2: 97% 97% 95% 98%  Wt Readings from Last 3 Encounters:  03/04/22 47.6 kg  02/18/22 47.2 kg  02/10/22 48.1 kg     Intake/Output Summary (Last 24 hours) at 03/18/2022 0757 Last data filed at 03/18/2022 0865 Gross per 24 hour  Intake 100 ml  Output 400 ml  Net -300 ml     Physical Exam  Awake Alert, No new F.N deficits, Normal affect .AT,PERRAL Supple Neck, No JVD,    Symmetrical Chest wall movement, Good air movement bilaterally, chronic bibasilar Rales likely due to ILD RRR,No Gallops,Rubs, soft systolic aortic murmur +ve B.Sounds, Abd Soft, No tenderness,   No Cyanosis, Clubbing or edema       Data Review:    CBC Recent Labs  Lab 03/12/22 1247 03/17/22 1621 03/18/22 0147  WBC 7.6 10.9* 9.7  HGB 11.9* 11.0* 11.5*  HCT 36.0 32.8* 35.8*  PLT 208.0 215 244  MCV 108.5* 107.9* 110.8*  MCH  --  36.2* 35.6*  MCHC 33.0 33.5 32.1  RDW 14.2 13.5 13.5  LYMPHSABS 0.8 0.6*  --   MONOABS 0.5 0.6  --   EOSABS 0.1 0.0  --   BASOSABS 0.0 0.0  --     Electrolytes Recent Labs  Lab 03/12/22 1247 03/17/22 1621 03/17/22 1622 03/18/22 0147  NA 137 133*  --  132*  K 3.8 4.0  --  3.9  CL 98 98  --  99  CO2 30 24  --  23  GLUCOSE 101* 165*  --  107*  BUN 36* 33*  --  31*  CREATININE 1.26* 1.24*  --  1.20*  CALCIUM 9.7 9.0  --  9.0  AST 24 27  --  24  25  ALT 13 13  --  13  14  ALKPHOS 58 54  --  54  54  BILITOT 0.6 1.0  --  0.6  0.8  ALBUMIN 4.3 3.6  --  3.4*  3.4*  MG  --   --   --  1.9  PROCALCITON  --   --   --  0.52  BNP  --   --  2,025.3*  --     Radiology Reports CT Angio Chest Pulmonary Embolism (PE) W or WO Contrast  Result Date: 03/18/2022 CLINICAL DATA:  Chest pain EXAM: CT ANGIOGRAPHY CHEST WITH CONTRAST TECHNIQUE: Multidetector CT imaging of the chest was performed using the standard protocol during bolus administration of intravenous contrast. Multiplanar CT image reconstructions and MIPs were obtained to evaluate the vascular anatomy. RADIATION DOSE REDUCTION: This exam was performed according to the departmental dose-optimization program which includes automated exposure control, adjustment of the mA and/or kV according to patient size and/or use of iterative reconstruction technique. CONTRAST:  79m OMNIPAQUE IOHEXOL 350 MG/ML SOLN COMPARISON:  CT chest 03/30/2021 FINDINGS: Cardiovascular: The heart is mildly enlarged. There  is a large pericardial effusion which is low-density. Aorta is normal in size. There are atherosclerotic calcifications of the aorta. There is adequate opacification of the pulmonary arteries to the segmental level. There is no evidence for pulmonary embolism. Mediastinum/Nodes: No enlarged mediastinal, hilar, or axillary lymph nodes. Thyroid gland, trachea, and esophagus demonstrate no significant findings. Lungs/Pleura: There are small bilateral pleural effusions, left greater than right. There is airspace consolidation of the inferior left lower lobe and superior segment of the right lower lobe. There are patchy and confluent ground-glass opacities bilaterally. There also ill-defined ground-glass nodular densities measuring up to 8 mm in the upper lungs. Peripheral reticular opacities in peripheral patchy  airspace opacities persist and have mildly increased in the left upper lobe. Trachea and central airways appear patent. There is no evidence for pneumothorax. Upper Abdomen: No acute abnormality. Musculoskeletal: No chest wall abnormality. No acute or significant osseous findings. Review of the MIP images confirms the above findings. IMPRESSION: 1. No evidence for pulmonary embolism. 2. Large pericardial effusion. 3. Mild cardiomegaly. 4. Small bilateral pleural effusions. 5. Bilateral lower lobe airspace disease compatible with infection. 6. Ground-glass opacities in both lungs favored as edema, although infection is not excluded. Some ground-glass areas are nodular. 7. A follow-up chest CT is recommended in 3 months to re-evaluate and confirm resolution. Electronically Signed   By: Ronney Asters M.D.   On: 03/18/2022 00:03   DG Chest Port 1 View  Result Date: 03/17/2022 CLINICAL DATA:  Shortness of breath.  Weakness. EXAM: PORTABLE CHEST 1 VIEW COMPARISON:  03/30/2021 FINDINGS: Marked cardiac enlargement is stable. Diffuse pulmonary interstitial prominence also stable. Infiltrate or atelectasis in left  lower lobe also shows no significant change. IMPRESSION: Left lobe atelectasis versus infiltrate, without significant change. Stable cardiomegaly and diffuse pulmonary interstitial prominence. Electronically Signed   By: Marlaine Hind M.D.   On: 03/17/2022 17:06

## 2022-03-18 NOTE — Evaluation (Signed)
Occupational Therapy Evaluation Patient Details Name: Paula Ward MRN: [id number] DOB: [birth year] Today's Date: 03/18/2022   History of Present Illness 86 y.o. female admitted with Atypical chest pain with moderate to severe pericardial effusion with acute on chronic diastolic CHF and PNA.  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  HR increasing from low 100s at rest to 120 with mobility.  Oxygen sats also at 89-94% on room air with activity and at 93% are rest.  No further acute OT needs at this time.       Recommendations for follow up therapy are one component of a multi-disciplinary discharge planning process, led by the attending physician.  Recommendations may be updated based on patient status, additional functional criteria and insurance authorization.   Follow Up Recommendations  No OT follow up    Assistance Recommended at Discharge None     Functional Status Assessment  Patient has not had a recent decline in their functional status  Equipment Recommendations  None recommended by OT       Precautions / Restrictions Precautions Precaution Comments: monitor HR and O2 Restrictions Weight Bearing Restrictions: No      Mobility Bed Mobility Overal bed mobility: Independent                  Transfers Overall transfer level: Modified independent                 General transfer comment: Pt needed use of the grab bar for sit to stand secondary to lower toilet.  She reports her toilets being higher at home.      Balance Overall  balance assessment: No apparent balance deficits (not formally assessed)                                         ADL either performed or assessed with clinical judgement   ADL Overall ADL's : At baseline                                       General ADL Comments: Pt is currently modified independent for selfcare tasks at this time which is her baseline.  No further OT needs but did suggest having a seat in the shower to assist for safety and to help keep HR down.  HR increased up to 120 during OT with O2 sats on room air from 89-94%.     Vision Baseline Vision/History: 1 Wears glasses (reading glasses) Patient Visual Report: No change from baseline Vision Assessment?: No apparent visual deficits     Perception Perception Perception: Within Functional Limits   Praxis Praxis Praxis: Intact    Pertinent Vitals/Pain Pain Assessment Pain Assessment: No/denies pain     Hand Dominance Right   Extremity/Trunk Assessment Upper Extremity Assessment Upper Extremity Assessment: Generalized weakness (4/5 shoulders and elbows, 3+/5 grip)   Lower Extremity Assessment Lower Extremity Assessment: Defer to PT evaluation  Communication Communication Communication: No difficulties   Cognition Arousal/Alertness: Awake/alert Behavior During Therapy: WFL for tasks assessed/performed Overall Cognitive Status: Within Functional Limits for tasks assessed                                       General Comments  Pt with no LOB wheh ambulating in the hallway without an assistive device or when completing toilet transfers, or picking items up off of the floor.            Home Living Family/patient expects to be discharged to:: Private residence Living Arrangements: Spouse/significant other Available Help at Discharge: Available 24 hours/day Type of Home: House Home Access: Stairs to enter [employer] of Steps: 3 Entrance  Stairs-Rails: Right Home Layout: One level     Bathroom Shower/Tub: Occupational psychologist: [place];Cane - single Barista (2 wheels);Other (comment)   Additional Comments: Home O2 used mostly at night but used sometimes during the day with exertion.      Prior Functioning/Environment Prior Level of Function : Independent/Modified Independent                                           AM-PAC OT "6 Clicks" Daily Activity     Outcome Measure Help from another person eating meals?: None Help from another person taking care of personal grooming?: None Help from another person toileting, which includes using toliet, bedpan, or urinal?: None Help from another person bathing (including washing, rinsing, drying)?: None Help from another person to put on and taking off regular upper body clothing?: None Help from another person to put on and taking off regular lower body clothing?: None 6 Click Score: 24   End of Session Nurse Communication: Mobility status  Activity Tolerance: Patient tolerated treatment well Patient left: in bed;with call bell/phone within reach                   Time: 5093-2671 OT Time Calculation (min): 39 min Charges:  OT General Charges $OT Visit: 1 Visit OT Evaluation $OT Eval Low Complexity: 1 Low OT Treatments $Self Care/Home Management : 23-37 mins  Chidubem Chaires OTR/L 03/18/2022, 9:38 AM

## 2022-03-18 NOTE — TOC Initial Note (Signed)
Transition of Care [facility]) - Initial/Assessment Note    Patient Details  Name: Paula Ward MRN: [id number] Date of Birth: 09/05/1936  Transition of Care [facility]) CM/SW Contact:    Bethena Roys, RN Phone Number: 03/18/2022, 3:47 PM  Clinical Narrative:  Risk for readmission assessment completed. Case Manager spoke with the patient  and PTA patient was from home with spouse. Patient states she only uses a cane outside of the house if needed. Patient is active with [employer] for oxygen needs 02 at 2 Liters. Patient states she still drives, prepares meals, and picks up medications.  Case Manager will continue to follow for transition of care needs as the patient progresses.             Expected Discharge Plan: Home/Self Care Barriers to Discharge: Continued Medical Work up   Patient Goals and CMS Choice Patient states their goals for this hospitalization and ongoing recovery are:: patient wants to return home.   Choice offered to / list presented to : NA  Expected Discharge Plan and Services Expected Discharge Plan: Home/Self Care In-house Referral: NA Discharge Planning Services: CM Consult   Living arrangements for the past 2 months: Single Family Home                   DME Agency: NA     Prior Living Arrangements/Services Living arrangements for the past 2 months: Single Family Home Lives with:: Spouse Patient language and need for interpreter reviewed:: Yes Do you feel safe going back to the place where you live?: Yes      Need for Family Participation in Patient Care: No (Comment) Care giver support system in place?: No (comment) Current home services: DME (Patient has oxygen, and cane) Criminal Activity/Legal Involvement Pertinent to Current Situation/Hospitalization: No - Comment as needed  Activities of Daily Living      Permission Sought/Granted Permission sought to share information with : Family Supports, Customer service manager, Case  Manager           Emotional Assessment Appearance:: Appears stated age Attitude/Demeanor/Rapport: Engaged Affect (typically observed): Appropriate Orientation: : Oriented to Situation, Oriented to  Time, Oriented to Self, Oriented to Place Alcohol / Substance Use: Not Applicable Psych Involvement: No (comment)  Admission diagnosis:  Precordial chest pain [R07.2] Pericardial effusion [I31.39] Elevated troponin [R77.8] Patient Active Problem List   Diagnosis Date Noted   CAP (community acquired pneumonia) 03/18/2022   Pressure injury of skin 03/18/2022   Chest pain, atypical 03/17/2022   Generalized weakness 03/29/2021   Acute on chronic diastolic CHF (congestive heart failure) ([place]) 03/29/2021   Elevated troponin 03/29/2021   AVM (arteriovenous malformation) of small bowel, acquired    SOB (shortness of breath) on exertion 03/08/2021   Anemia, chronic disease 12/25/2020   Fatigue 12/25/2020   Hoarseness 07/24/2020   Drowsiness 12/17/2018   Leg swelling 09/03/2018   Orthopnea 06/24/2018   Dry mouth 02/15/2018   Allergic rhinitis 11/05/2017   Cerebellar 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 ([place]) 10/26/2017   Routine general medical examination at a health care facility 01/30/2017   Community acquired pneumonia 08/19/2016   Pericardial effusion 01/21/2016   Melena    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[place])    Osteoarthritis 02/25/2015   Mass of left side of neck    CKD (chronic kidney disease) stage 3, GFR 30-59 ml/min ([place]) 09/01/2012   RECTAL INCONTINENCE 03/27/2009   Angiodysplasia of stomach and duodenum 08/22/2008   ADENOCARCINOMA, BREAST, LEFT 07/26/2007  Anemia, iron deficiency 07/26/2007   Essential hypertension 07/26/2007   Pulmonary hypertension ([place]) 07/26/2007   GASTROESOPHAGEAL REFLUX DISEASE 07/26/2007   SCLERODERMA 07/26/2007   ILD (interstitial lung disease) ([place]) 07/26/2007   PULMONARY EMBOLISM, HX OF 07/26/2007   PCP:  Hoyt Koch,  MD Pharmacy:   [facility] [address], [place] [address] Phone: [phone] Fax: [place] [address] [place] [address] Phone: [phone] Fax: [phone]     Readmission Risk Interventions    03/18/2022    3:45 PM  Readmission Risk Prevention Plan  Transportation Screening Complete  PCP or Specialist Appt within 3-5 Days Complete  HRI or Home Care Consult Complete  Social Work Consult for [place] Planning/Counseling Complete  Palliative Care Screening Not Applicable  Medication Review Press photographer) Complete

## 2022-03-18 NOTE — ED Notes (Signed)
ED TO INPATIENT HANDOFF REPORT  ED Nurse Name and Phone #: [phone] [place] Name/Age/Gender Paula Ward 86 y.o. female Room/Bed: 028C/028C  Code Status   Code Status: Full Code  Home/SNF/Other Home Patient oriented to: self, place, time, and situation Is this baseline? Yes   Triage Complete: Triage complete  Chief Complaint Pericardial effusion [I31.39]  Triage Note Pt POV ambulatory. Reports missing 2 doses of her daily diltiazem, now having shob, cp, and weakness. Describes cp as soreness. Denies n/v, diarrhea, LE swelling, new cough.   Pt BIB [facility] due to chest pain. Transfer [facility]    Allergies Allergies  Allergen Reactions   Codeine Nausea Only    Hallucinations, too   Other Nausea And Vomiting    "-mycin" antibiotics.   Also cause hallucinations.   Erythromycin Nausea And Vomiting   Lisinopril     Pt doesn't remember    Mycophenolate Mofetil Nausea Only   Sulfa Antibiotics Other (See Comments)    unknown    Level of Care/Admitting Diagnosis ED Disposition     ED Disposition  Admit   Condition  --   [place]: [place] [100100]  Level of Care: Progressive [102]  Admit to Progressive based on following criteria: CARDIOVASCULAR & THORACIC of moderate stability with acute coronary syndrome symptoms/low risk myocardial infarction/hypertensive urgency/arrhythmias/heart failure potentially compromising stability and stable post cardiovascular intervention patients.  May place patient in observation at [facility] or [place] if equivalent level of care is available:: No  Covid Evaluation: Asymptomatic - no recent exposure (last 10 days) testing not required  Diagnosis: Pericardial effusion [338250]  Admitting Physician: Toy Baker [[id number]]  Attending Physician: Toy Baker [[id number]]          B Medical/Surgery History Past Medical History:  Diagnosis Date   Anemia     Angiodysplasia of stomach and duodenum    Arthus phenomenon    AVM (arteriovenous malformation)    Blood transfusion without reported diagnosis    last 4 units 12-22-15, Iron infusion x2 last -01-07-16,01-14-16.   Breast cancer ([place]) 1989   Left   Candida esophagitis (HCC)    Cataract    Chronic kidney disease    Chronic mild renal insuffiency   CREST syndrome (HCC)    GERD (gastroesophageal reflux disease)    w/ HH   Interstitial lung disease (HCC)    Nodule of kidney    Pericardial effusion    PONV (postoperative nausea and vomiting)    Pulmonary embolus ([place]) 2003   Pulmonary hypertension ([place])    followed by Dr Gaynell Face at [facility], now Dr. Maryjean Ka visit end 2'17.   Rectal incontinence    Renal cell carcinoma (HCC)    Scleroderma (HCC)    Tubular adenoma of colon    Uterine polyp    Past Surgical History:  Procedure Laterality Date   APPENDECTOMY  1962   BREAST LUMPECTOMY  1989   left   CATARACT EXTRACTION, BILATERAL Bilateral 12/2013   COLONOSCOPY WITH PROPOFOL N/A 04/15/2021   Procedure: COLONOSCOPY WITH PROPOFOL;  Surgeon: Milus Banister, MD;  Location: [facility];  Service: Endoscopy;  Laterality: N/A;   ENTEROSCOPY N/A 01/18/2016   Procedure: ENTEROSCOPY;  Surgeon: Mauri Pole, MD;  [facility];  Service: Endoscopy;  Laterality: N/A;   ENTEROSCOPY N/A 05/24/2018   Procedure: ENTEROSCOPY;  Surgeon: Jackquline Denmark, MD;  [facility];  Service: Endoscopy;  Laterality: N/A;   ENTEROSCOPY  N/A 03/29/2021   Procedure: ENTEROSCOPY;  Surgeon: Jerene Bears, MD;  Location[facility];  Service: Gastroenterology;  Laterality: N/A;   ENTEROSCOPY N/A 04/13/2021   Procedure: ENTEROSCOPY;  Surgeon: Yetta Flock, MD;  [facility];  Service: Gastroenterology;  Laterality: N/A;   ESOPHAGOGASTRODUODENOSCOPY (EGD) WITH PROPOFOL N/A 12/21/2015   Procedure: ESOPHAGOGASTRODUODENOSCOPY (EGD) WITH PROPOFOL;  Surgeon: Irene Shipper, MD;  [facility];  Service: Endoscopy;  Laterality: N/A;   HOT HEMOSTASIS N/A 05/24/2018   Procedure: HOT HEMOSTASIS (ARGON PLASMA COAGULATION/BICAP);  Surgeon: Jackquline Denmark, MD;  Location: [facility];  Service: Endoscopy;  Laterality: N/A;   HOT HEMOSTASIS N/A 03/29/2021   Procedure: HOT HEMOSTASIS (ARGON PLASMA COAGULATION/BICAP);  Surgeon: Jerene Bears, MD;  Location[facility];  Service: Gastroenterology;  Laterality: N/A;   HOT HEMOSTASIS N/A 04/13/2021   Procedure: HOT HEMOSTASIS (ARGON PLASMA COAGULATION/BICAP);  Surgeon: Yetta Flock, MD;  Location: [facility];  Service: Gastroenterology;  Laterality: N/A;   IR GENERIC HISTORICAL  06/05/2016   IR RADIOLOGIST EVAL & MGMT 06/05/2016 Aletta Edouard, MD [facility] INTERV RAD   IVC Filter     KIDNEY SURGERY     left -"laser surgery by Dr. Kathlene Cote- 5 yrs ago" no removal   SCHLEROTHERAPY  05/24/2018   Procedure: SCHLEROTHERAPY;  Surgeon: Jackquline Denmark, MD;  [facility];  Service: Endoscopy;;   SUBMUCOSAL TATTOO INJECTION  04/13/2021   Procedure: SUBMUCOSAL TATTOO INJECTION;  Surgeon: Yetta Flock, MD;  [facility];  Service: Gastroenterology;;   TONSILLECTOMY     TUBAL LIGATION       A IV Location/Drains/Wounds Patient Lines/Drains/Airways Status     Active Line/Drains/Airways     Name Placement date Placement time Site Days   Peripheral IV 03/17/22 20 G 1" Left;Anterior Forearm 03/17/22  1620  Forearm  1   Peripheral IV 03/18/22 20 G Right Antecubital 03/18/22  0147  Antecubital  less than 1   Airway 04/13/21  1416  -- 339            Intake/Output Last 24 hours  Intake/Output Summary (Last 24 hours) at 03/18/2022 0620 Last data filed at 03/18/2022 3086 Gross per 24 hour  Intake 100 ml  Output 400 ml  Net -300 ml    Labs/Imaging Results for orders placed or performed during the hospital encounter of 03/17/22 (from the past 48 hour(s))  Comprehensive metabolic panel     Status: Abnormal    Collection Time: 03/17/22  4:21 PM  Result Value Ref Range   Sodium 133 (L) 135 - 145 mmol/L   Potassium 4.0 3.5 - 5.1 mmol/L   Chloride 98 98 - 111 mmol/L   CO2 24 22 - 32 mmol/L   Glucose, Bld 165 (H) 70 - 99 mg/dL    Comment: Glucose reference range applies only to samples taken after fasting for at least 8 hours.   BUN 33 (H) 8 - 23 mg/dL   Creatinine, Ser 1.24 (H) 0.44 - 1.00 mg/dL   Calcium 9.0 8.9 - 10.3 mg/dL   Total Protein 6.9 6.5 - 8.1 g/dL   Albumin 3.6 3.5 - 5.0 g/dL   AST 27 15 - 41 U/L   ALT 13 0 - 44 U/L   Alkaline Phosphatase 54 38 - 126 U/L   Total Bilirubin 1.0 0.3 - 1.2 mg/dL   GFR, Estimated 43 (L) >60 mL/min    Comment: (NOTE) Calculated using the CKD-EPI Creatinine Equation (2021)    Anion gap 11  5 - 15    Comment: Performed at [facility], [address] [place], [place] [address]  CBC with Differential     Status: Abnormal   Collection Time: 03/17/22  4:21 PM  Result Value Ref Range   WBC 10.9 (H) 4.0 - 10.5 K/uL   RBC 3.04 (L) 3.87 - 5.11 MIL/uL   Hemoglobin 11.0 (L) 12.0 - 15.0 g/dL   HCT 32.8 (L) 36.0 - 46.0 %   MCV 107.9 (H) 80.0 - 100.0 fL   MCH 36.2 (H) 26.0 - 34.0 pg   MCHC 33.5 30.0 - 36.0 g/dL   RDW 13.5 11.5 - 15.5 %   Platelets 215 150 - 400 K/uL   nRBC 0.0 0.0 - 0.2 %   Neutrophils Relative % 89 %   Neutro Abs 9.7 (H) 1.7 - 7.7 K/uL   Lymphocytes Relative 5 %   Lymphs Abs 0.6 (L) 0.7 - 4.0 K/uL   Monocytes Relative 5 %   Monocytes Absolute 0.6 0.1 - 1.0 K/uL   Eosinophils Relative 0 %   Eosinophils Absolute 0.0 0.0 - 0.5 K/uL   Basophils Relative 0 %   Basophils Absolute 0.0 0.0 - 0.1 K/uL   Immature Granulocytes 1 %   Abs Immature Granulocytes 0.05 0.00 - 0.07 K/uL    Comment: Performed at [facility], [address] [place], [place] [address]  Brain natriuretic peptide     Status: Abnormal   Collection Time: 03/17/22  4:22 PM  Result Value Ref Range   B Natriuretic Peptide 2,025.3 (H) 0.0 - 100.0  pg/mL    Comment: Performed at [facility], [address] [place], [place] [address]  Troponin I (High Sensitivity)     Status: Abnormal   Collection Time: 03/17/22  4:23 PM  Result Value Ref Range   Troponin I (High Sensitivity) 30 (H) <18 ng/L    Comment: (NOTE) Elevated high sensitivity troponin I (hsTnI) values and significant  changes across serial measurements may suggest ACS but many other  chronic and acute conditions are known to elevate hsTnI results.  Refer to the "Links" section for chest pain algorithms and additional  guidance. Performed at [facility], [address] [place], [place] [address]   Troponin I (High Sensitivity)     Status: Abnormal   Collection Time: 03/17/22  6:11 PM  Result Value Ref Range   Troponin I (High Sensitivity) 26 (H) <18 ng/L    Comment: (NOTE) Elevated high sensitivity troponin I (hsTnI) values and significant  changes across serial measurements may suggest ACS but many other  chronic and acute conditions are known to elevate hsTnI results.  Refer to the "Links" section for chest pain algorithms and additional  guidance. Performed at [facility], [place] [address], [place], [place] [address]   Strep pneumoniae urinary antigen     Status: None   Collection Time: 03/18/22  1:27 AM  Result Value Ref Range   Strep Pneumo Urinary Antigen NEGATIVE NEGATIVE    Comment:        Infection due to S. pneumoniae cannot be absolutely ruled out since the antigen present may be below the detection limit of the test. Performed at [facility], [address], [place], [place] [address]   MRSA Next Gen by PCR, Nasal     Status: None   Collection Time: 03/18/22  1:27 AM   Specimen: Nasal Mucosa; Nasal Swab  Result Value Ref Range   MRSA by PCR Next Gen NOT DETECTED NOT DETECTED    Comment: (NOTE) The GeneXpert MRSA Assay (FDA approved for NASAL specimens only), is one component of a comprehensive MRSA  colonization surveillance program. It is not intended to diagnose MRSA infection nor to guide or monitor treatment for MRSA infections. Test performance is not FDA approved in patients less than 67 years old. Performed at [facility], [place] [address] [place], [place] [address]   Comprehensive metabolic panel     Status: Abnormal   Collection Time: 03/18/22  1:47 AM  Result Value Ref Range   Sodium 132 (L) 135 - 145 mmol/L   Potassium 3.9 3.5 - 5.1 mmol/L   Chloride 99 98 - 111 mmol/L   CO2 23 22 - 32 mmol/L   Glucose, Bld 107 (H) 70 - 99 mg/dL    Comment: Glucose reference range applies only to samples taken after fasting for at least 8 hours.   BUN 31 (H) 8 - 23 mg/dL   Creatinine, Ser 1.20 (H) 0.44 - 1.00 mg/dL   Calcium 9.0 8.9 - 10.3 mg/dL   Total Protein 6.7 6.5 - 8.1 g/dL   Albumin 3.4 (L) 3.5 - 5.0 g/dL   AST 25 15 - 41 U/L   ALT 14 0 - 44 U/L   Alkaline Phosphatase 54 38 - 126 U/L   Total Bilirubin 0.8 0.3 - 1.2 mg/dL   GFR, Estimated 45 (L) >60 mL/min    Comment: (NOTE) Calculated using the CKD-EPI Creatinine Equation (2021)    Anion gap 10 5 - 15    Comment: Performed at [place] [address] [place], [place] [address]  CBC     Status: Abnormal   Collection Time: 03/18/22  1:47 AM  Result Value Ref Range   WBC 9.7 4.0 - 10.5 K/uL   RBC 3.23 (L) 3.87 - 5.11 MIL/uL   Hemoglobin 11.5 (L) 12.0 - 15.0 g/dL   HCT 35.8 (L) 36.0 - 46.0 %   MCV 110.8 (H) 80.0 - 100.0 fL   MCH 35.6 (H) 26.0 - 34.0 pg   MCHC 32.1 30.0 - 36.0 g/dL   RDW 13.5 11.5 - 15.5 %   Platelets 244 150 - 400 K/uL   nRBC 0.0 0.0 - 0.2 %    Comment: Performed at [facility], [place] [address] [place], [place] [address]  CK     Status: None   Collection Time: 03/18/22  1:47 AM  Result Value Ref Range   Total CK 56 38 - 234 U/L    Comment: Performed at [facility], [place] [address] [place] [address], [place], [place] [address]  Hepatic function panel     Status: Abnormal   Collection  Time: 03/18/22  1:47 AM  Result Value Ref Range   Total Protein 6.7 6.5 - 8.1 g/dL   Albumin 3.4 (L) 3.5 - 5.0 g/dL   AST 24 15 - 41 U/L   ALT 13 0 - 44 U/L   Alkaline Phosphatase 54 38 - 126 U/L   Total Bilirubin 0.6 0.3 - 1.2 mg/dL   Bilirubin, Direct 0.2 0.0 - 0.2 mg/dL   Indirect Bilirubin 0.4 0.3 - 0.9 mg/dL    Comment: Performed at [facility] [address] [place], [place] [address]  Magnesium     Status: None   Collection Time: 03/18/22  1:47 AM  Result Value Ref Range   Magnesium  1.9 1.7 - 2.4 mg/dL    Comment: Performed at [facility], [place] [address] [place], [place] [address]  Phosphorus     Status: None   Collection Time: 03/18/22  1:47 AM  Result Value Ref Range   Phosphorus 3.3 2.5 - 4.6 mg/dL    Comment: Performed at [facility] [address], [place], [place] [address]  Procalcitonin - Baseline     Status: None   Collection Time: 03/18/22  1:47 AM  Result Value Ref Range   Procalcitonin 0.52 ng/mL    Comment:        Interpretation: PCT > 0.5 ng/mL and <= 2 ng/mL: Systemic infection (sepsis) is possible, but other conditions are known to elevate PCT as well. (NOTE)       Sepsis PCT Algorithm           Lower Respiratory Tract                                      Infection PCT Algorithm    ----------------------------     ----------------------------         PCT < 0.25 ng/mL                PCT < 0.10 ng/mL          Strongly encourage             Strongly discourage   discontinuation of antibiotics    initiation of antibiotics    ----------------------------     -----------------------------       PCT 0.25 - 0.50 ng/mL            PCT 0.10 - 0.25 ng/mL               OR       >80% decrease in PCT            Discourage initiation of                                            antibiotics      Encourage discontinuation           of antibiotics    ----------------------------     -----------------------------         PCT >= 0.50 ng/mL               PCT 0.26 - 0.50 ng/mL                AND       <80% decrease in PCT             Encourage initiation of                                             antibiotics       Encourage continuation           of antibiotics    ----------------------------     -----------------------------        PCT >= 0.50 ng/mL                  PCT > 0.50 ng/mL  AND         increase in PCT                  Strongly encourage                                      initiation of antibiotics    Strongly encourage escalation           of antibiotics                                     -----------------------------                                           PCT <= 0.25 ng/mL                                                 OR                                        > 80% decrease in PCT                                      Discontinue / Do not initiate                                             antibiotics  Performed at [facility], [address], [place],  [address]    *Note: Due to a large number of results and/or encounters for the requested time period, some results have not been displayed. A complete set of results can be found in Results Review.   CT Angio Chest Pulmonary Embolism (PE) W or WO Contrast  Result Date: 03/18/2022 CLINICAL DATA:  Chest pain EXAM: CT ANGIOGRAPHY CHEST WITH CONTRAST TECHNIQUE: Multidetector CT imaging of the chest was performed using the standard protocol during bolus administration of intravenous contrast. Multiplanar CT image reconstructions and MIPs were obtained to evaluate the vascular anatomy. RADIATION DOSE REDUCTION: This exam was performed according to the departmental dose-optimization program which includes automated exposure control, adjustment of the mA and/or kV according to patient size and/or use of iterative reconstruction technique. CONTRAST:  43m OMNIPAQUE IOHEXOL 350 MG/ML SOLN COMPARISON:  CT chest 03/30/2021 FINDINGS:  Cardiovascular: The heart is mildly enlarged. There is a large pericardial effusion which is low-density. Aorta is normal in size. There are atherosclerotic calcifications of the aorta. There is adequate opacification of the pulmonary arteries to the segmental level. There is no evidence for pulmonary embolism. Mediastinum/Nodes: No enlarged mediastinal, hilar, or axillary lymph nodes. Thyroid gland, trachea, and esophagus demonstrate no significant findings. Lungs/Pleura: There are small bilateral pleural effusions, left greater than right. There is airspace consolidation of the inferior left lower lobe and superior segment of the right  lower lobe. There are patchy and confluent ground-glass opacities bilaterally. There also ill-defined ground-glass nodular densities measuring up to 8 mm in the upper lungs. Peripheral reticular opacities in peripheral patchy airspace opacities persist and have mildly increased in the left upper lobe. Trachea and central airways appear patent. There is no evidence for pneumothorax. Upper Abdomen: No acute abnormality. Musculoskeletal: No chest wall abnormality. No acute or significant osseous findings. Review of the MIP images confirms the above findings. IMPRESSION: 1. No evidence for pulmonary embolism. 2. Large pericardial effusion. 3. Mild cardiomegaly. 4. Small bilateral pleural effusions. 5. Bilateral lower lobe airspace disease compatible with infection. 6. Ground-glass opacities in both lungs favored as edema, although infection is not excluded. Some ground-glass areas are nodular. 7. A follow-up chest CT is recommended in 3 months to re-evaluate and confirm resolution. Electronically Signed   By: Ronney Asters M.D.   On: 03/18/2022 00:03   DG Chest Port 1 View  Result Date: 03/17/2022 CLINICAL DATA:  Shortness of breath.  Weakness. EXAM: PORTABLE CHEST 1 VIEW COMPARISON:  03/30/2021 FINDINGS: Marked cardiac enlargement is stable. Diffuse pulmonary interstitial  prominence also stable. Infiltrate or atelectasis in left lower lobe also shows no significant change. IMPRESSION: Left lobe atelectasis versus infiltrate, without significant change. Stable cardiomegaly and diffuse pulmonary interstitial prominence. Electronically Signed   By: Marlaine Hind M.D.   On: 03/17/2022 17:06    Pending Labs Unresulted Labs (From admission, onward)     Start     Ordered   03/18/22 0128  Acid Fast Smear (AFB)  (AFB smear + Culture w reflexed sensitivities panel)  Once,   R       Question Answer Comment  Patient immune status Normal   Release to patient Immediate     See Hyperspace for full Linked Orders Report.   03/18/22 0128   03/18/22 0128  Acid Fast Culture with reflexed sensitivities  (AFB smear + Culture w reflexed sensitivities panel)  Once,   R       Comments: Eval for MAC   Question:  Patient immune status  Answer:  Normal  See Hyperspace for full Linked Orders Report.   03/18/22 0128   03/18/22 0128  Legionella Pneumophila Serogp 1 Ur Ag  Once,   R        03/18/22 0128   03/18/22 0127  Culture, blood (routine x 2) Call MD if unable to obtain prior to antibiotics being given  BLOOD CULTURE X 2,   R (with TIMED occurrences)     Comments: If blood cultures drawn in Emergency Department - Do not draw and cancel order   Question:  Patient immune status  Answer:  Normal   03/18/22 0128   03/18/22 0127  Expectorated Sputum Assessment w Gram Stain, Rflx to Resp Cult  Once,   R       Question:  Patient immune status  Answer:  Normal   03/18/22 0128   03/17/22 2303  TSH  Add-on,   AD        03/17/22 2302            Vitals/Pain Today's Vitals   03/18/22 0415 03/18/22 0430 03/18/22 0445 03/18/22 0500  BP:    (!) 119/57  Pulse: 87 92 86 89  Resp: (!) 23 (!) 31 (!) 21 20  Temp:      TempSrc:      SpO2: 96% 94% 91% 93%  PainSc:        Isolation Precautions No  active isolations  Medications Medications  ALPRAZolam (XANAX) tablet 0.125 mg (has  no administration in time range)  diltiazem (CARDIZEM CD) 24 hr capsule 120 mg (has no administration in time range)  montelukast (SINGULAIR) tablet 10 mg (10 mg Oral Given 03/17/22 2352)  pantoprazole (PROTONIX) EC tablet 40 mg (has no administration in time range)  spironolactone (ALDACTONE) tablet 12.5 mg (has no administration in time range)  sucralfate (CARAFATE) tablet 1 g (has no administration in time range)  acetaminophen (TYLENOL) tablet 650 mg (has no administration in time range)    Or  acetaminophen (TYLENOL) suppository 650 mg (has no administration in time range)  HYDROcodone-acetaminophen (NORCO/VICODIN) 5-325 MG per tablet 1-2 tablet (has no administration in time range)  sodium chloride flush (NS) 0.9 % injection 3 mL (3 mLs Intravenous Given 03/17/22 2352)  sodium chloride flush (NS) 0.9 % injection 3 mL (has no administration in time range)  0.9 %  sodium chloride infusion (has no administration in time range)  albuterol (PROVENTIL) (2.5 MG/3ML) 0.083% nebulizer solution 2.5 mg (has no administration in time range)  cefTRIAXone (ROCEPHIN) 2 g in sodium chloride 0.9 % 100 mL IVPB (has no administration in time range)  doxycycline (VIBRAMYCIN) 100 mg in sodium chloride 0.9 % 250 mL IVPB (0 mg Intravenous Stopped 03/18/22 0431)  furosemide (LASIX) injection 40 mg (40 mg Intravenous Given 03/17/22 2116)  iohexol (OMNIPAQUE) 350 MG/ML injection 50 mL (50 mLs Intravenous Contrast Given 03/17/22 2353)  cefTRIAXone (ROCEPHIN) 2 g in sodium chloride 0.9 % 100 mL IVPB (0 g Intravenous Stopped 03/18/22 0312)    Mobility walks with person assist Low fall risk   Focused Assessments Pulmonary Assessment Handoff:  Lung sounds:   O2 Device: Nasal Cannula O2 Flow Rate (L/min): 2 L/min    R Recommendations: See Admitting Provider Note  Report given to:   Additional Notes: Pt on 2L [place], up with assistance to [facility], A&Ox4

## 2022-03-18 NOTE — Progress Notes (Addendum)
.  Progress Note  Patient Name: Paula Ward Date of Encounter: 03/18/2022  [facility] Cardiologist: Kirk Ruths, MD   Subjective   No SOB and improved chest pressure    Inpatient Medications    Scheduled Meds:  diltiazem  120 mg Oral Daily   montelukast  10 mg Oral QHS   pantoprazole  40 mg Oral BID AC   predniSONE  40 mg Oral Q breakfast   sodium chloride flush  3 mL Intravenous Q12H   spironolactone  12.5 mg Oral Daily   Continuous Infusions:  sodium chloride     [START ON 03/19/2022] cefTRIAXone (ROCEPHIN)  IV     doxycycline (VIBRAMYCIN) IV 100 mg (03/18/22 0843)   PRN Meds: sodium chloride, acetaminophen **OR** acetaminophen, albuterol, ALPRAZolam, HYDROcodone-acetaminophen, sodium chloride flush, sucralfate   Vital Signs    Vitals:   03/18/22 0623 03/18/22 0700 03/18/22 0715 03/18/22 0825  BP: 128/61 (!) 116/52  (!) 126/59  Pulse: 90 87 91 100  Resp: 20 (!) _0 Temp: 98.9 F (37.2 C)   98.6 F (37 C)  TempSrc: Oral   Oral  SpO2: 97% 95% 98% 100%  Weight:    46.4 kg  Height:    _1  (1.6 m)    Intake/Output Summary (Last 24 hours) at 03/18/2022 0915 Last data filed at 03/18/2022 0539 Gross per 24 hour  Intake 100 ml  Output 400 ml  Net -300 ml      03/18/2022    8:25 AM 03/04/2022   12:11 PM 02/18/2022   12:11 PM  Last 3 Weights  Weight (lbs) 102 lb 4.8 oz 104 lb 15 oz 104 lb 0.9 oz  Weight (kg) 46.403 kg 47.6 kg 47.2 kg      Telemetry    SR to ST - Personally Reviewed  ECG    SR with non specific ST changes - Personally Reviewed  Physical Exam   GEN: No acute distress.   Neck: No JVD Cardiac: RRR, 3/6 systolic murmur, rubs, or gallops.  Respiratory: Clear to auscultation bilaterally. GI: Soft, nontender, non-distended  MS: No edema; No deformity. Neuro:  Nonfocal  Psych: Normal affect   Labs    High Sensitivity Troponin:   Recent Labs  Lab 03/17/22 1623 03/17/22 1811  TROPONINIHS 30* 26*     Chemistry Recent  Labs  Lab 03/12/22 1247 03/17/22 1621 03/18/22 0147  NA 137 133* 132*  K 3.8 4.0 3.9  CL 98 98 99  CO2 _2 GLUCOSE 101* 165* 107*  BUN 36* 33* 31*  CREATININE 1.26* 1.24* 1.20*  CALCIUM 9.7 9.0 9.0  MG  --   --  1.9  PROT 7.1 6.9 6.7  6.7  ALBUMIN 4.3 3.6 3.4*  3.4*  AST _3 ALT _4 ALKPHOS 58 54 54  54  BILITOT 0.6 1.0 0.6  0.8  GFRNONAA  --  43* 45*  ANIONGAP  --  11 10    Lipids No results for input(s): CHOL, TRIG, HDL, LABVLDL, LDLCALC, CHOLHDL in the last 168 hours.  Hematology Recent Labs  Lab 03/12/22 1247 03/17/22 1621 03/18/22 0147  WBC 7.6 10.9* 9.7  RBC 3.32* 3.04* 3.23*  HGB 11.9* 11.0* 11.5*  HCT 36.0 32.8* 35.8*  MCV 108.5* 107.9* 110.8*  MCH  --  36.2* 35.6*  MCHC 33.0 33.5 32.1  RDW 14.2 13.5 13.5  PLT 208.0 215 244   Thyroid No results  for input(s): TSH, FREET4 in the last 168 hours.  BNP Recent Labs  Lab 03/17/22 1622  BNP 2,025.3*    DDimer No results for input(s): DDIMER in the last 168 hours.   Radiology    CT Angio Chest Pulmonary Embolism (PE) W or WO Contrast  Result Date: 03/18/2022 CLINICAL DATA:  Chest pain EXAM: CT ANGIOGRAPHY CHEST WITH CONTRAST TECHNIQUE: Multidetector CT imaging of the chest was performed using the standard protocol during bolus administration of intravenous contrast. Multiplanar CT image reconstructions and MIPs were obtained to evaluate the vascular anatomy. RADIATION DOSE REDUCTION: This exam was performed according to the departmental dose-optimization program which includes automated exposure control, adjustment of the mA and/or kV according to patient size and/or use of iterative reconstruction technique. CONTRAST:  75m OMNIPAQUE IOHEXOL 350 MG/ML SOLN COMPARISON:  CT chest 03/30/2021 FINDINGS: Cardiovascular: The heart is mildly enlarged. There is a large pericardial effusion which is low-density. Aorta is normal in size. There are atherosclerotic calcifications of the aorta.  There is adequate opacification of the pulmonary arteries to the segmental level. There is no evidence for pulmonary embolism. Mediastinum/Nodes: No enlarged mediastinal, hilar, or axillary lymph nodes. Thyroid gland, trachea, and esophagus demonstrate no significant findings. Lungs/Pleura: There are small bilateral pleural effusions, left greater than right. There is airspace consolidation of the inferior left lower lobe and superior segment of the right lower lobe. There are patchy and confluent ground-glass opacities bilaterally. There also ill-defined ground-glass nodular densities measuring up to 8 mm in the upper lungs. Peripheral reticular opacities in peripheral patchy airspace opacities persist and have mildly increased in the left upper lobe. Trachea and central airways appear patent. There is no evidence for pneumothorax. Upper Abdomen: No acute abnormality. Musculoskeletal: No chest wall abnormality. No acute or significant osseous findings. Review of the MIP images confirms the above findings. IMPRESSION: 1. No evidence for pulmonary embolism. 2. Large pericardial effusion. 3. Mild cardiomegaly. 4. Small bilateral pleural effusions. 5. Bilateral lower lobe airspace disease compatible with infection. 6. Ground-glass opacities in both lungs favored as edema, although infection is not excluded. Some ground-glass areas are nodular. 7. A follow-up chest CT is recommended in 3 months to re-evaluate and confirm resolution. Electronically Signed   By: ARonney AstersM.D.   On: 03/18/2022 00:03   DG Chest Port 1 View  Result Date: 03/17/2022 CLINICAL DATA:  Shortness of breath.  Weakness. EXAM: PORTABLE CHEST 1 VIEW COMPARISON:  03/30/2021 FINDINGS: Marked cardiac enlargement is stable. Diffuse pulmonary interstitial prominence also stable. Infiltrate or atelectasis in left lower lobe also shows no significant change. IMPRESSION: Left lobe atelectasis versus infiltrate, without significant change. Stable  cardiomegaly and diffuse pulmonary interstitial prominence. Electronically Signed   By: JMarlaine HindM.D.   On: 03/17/2022 17:06    Cardiac Studies   Echo pending    [facility]  RA 7  RV 50/8  PA 50/21   PCWP 21.  PVR 1.8  SVR 10.7  CI 4.1   Impr:  Chronic diastolic heart dysfunction No indication for pulm vasodilator), chronic pericardial effusion, Moderate AS, moderate MS, PE(IVC filter), GIB (AVMs).  Normal coronary arteries in 2003  Patient Profile     86y.o. 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  Echo pending  Assessment & Plan    Large pericardial effusion on CTA of chest with hx of chronic pericardial effusion.  Echo pending   Chest pressure began Friday was stable prior to that.  She was needing mor 02 over the weekend.  --hs tropoin 30 and 26  --not felt to be active coronary ischemia no acute EKG changes.  HFpEF with MS playing some role per cath in 11/2021  --EF 1/20223 55-60%  --Lasix yesterday neg 300 if I&O correct  AS moderate on echo at [facility] index 0.27  Mean gradient 28       echo pending  MS Mod with mean gradient 6 mm Hg on recent echo echo pending    Jx CREST  Followed in pulmonary   Finished pulmonary rehab     GI   Hx of GI bleeding , AVM   Has gotten Fe injections       Renal   BUN / Cr 33/1.24    Reassess in am     Followed in renal clnic as outpt    ANemia     H/H  11/32.8   MCV 109  MCHC 36  Follow            For questions or updates, please contact [facility] Please consult [email or web address] for contact info under        Signed, Cecilie Kicks, NP  03/18/2022, 9:15 AM    I have seen and examined the patient along with Cecilie Kicks, NP .  I have reviewed the chart, notes and new data.  I agree with PA/NP's note.  Key new complaints: Chest pain syndrome on presentation had mixed characteristics.  It was retrosternal and felt like pressure,, was not sharp, but did appear to worsen with deep breathing.  It also worsened with walking. Key examination changes: No clinical signs of hypervolemia/elevated filling pressures.  Normal JVD.  Heart sounds are fairly crisp.  No rub.  No evidence of pulsus paradoxus. Key new findings / data: Reviewed echocardiogram at bedside.  She continues to have a large circumferential pericardial effusion, although most of it is located posteriorly.  The inferior vena cava is mildly dilated, but there is no evidence of right heart chamber collapse or respiratory flow variation across the mitral tricuspid valve.  She has moderate aortic valve stenosis with prominent calcification of the leaflets.  Normal left ventricular systolic function. Review of her chest CT angiogram (PE protocol) shows that there is calcification in the distribution of the right and left main coronary arteries as well as in the proximal-mid LAD artery.  PLAN: Suspect that this is an unchanged chronic large pericardial effusion (unfortunately unable to compare directly with the images from [facility]).  There are no clinical or echo signs of tamponade. At age 42, there is concern that she could also now have coronary insufficiency.  I think the next best step is to also perform a coronary CT angiogram.  We will give her renal function 24 hours to declare itself after the CT angio performed for pulmonary embolism.  Today creatinine is 1.20 which is slightly better than her previous estimated average of 1.3 lowest recorded creatinine in the last 12 months.  We will hold spironolactone prior to repeat CT.   Sanda Klein, MD, [place] [phone] 03/18/2022, 11:45 AM

## 2022-03-18 NOTE — Progress Notes (Signed)
Heart Failure Nurse Navigator Progress Note  Navigation team following this hospitalization. Screening pending further testing/cardiac workup.   Echo pending  Cardiac CT pending  Kevan Rosebush, RN, BSN, [facility] Heart Failure Navigator Heart & Vascular Care Navigation Team

## 2022-03-18 NOTE — Evaluation (Signed)
Clinical/Bedside Swallow Evaluation Patient Details  Name: Paula Ward MRN: [id number] Date of Birth: 15-Mar-1936  Today's Date: 03/18/2022 Time: SLP Start Time (ACUTE ONLY): 0900 SLP Stop Time (ACUTE ONLY): 0908 SLP Time Calculation (min) (ACUTE ONLY): 8 min  Past Medical History:  Past Medical History:  Diagnosis Date   Anemia    Angiodysplasia of stomach and duodenum    Arthus phenomenon    AVM (arteriovenous malformation)    Blood transfusion without reported diagnosis    last 4 units 12-22-15, Iron infusion x2 last -01-07-16,01-14-16.   Breast cancer ([place]) 1989   Left   Candida esophagitis (HCC)    Cataract    Chronic kidney disease    Chronic mild renal insuffiency   CREST syndrome (HCC)    GERD (gastroesophageal reflux disease)    w/ HH   Interstitial lung disease (HCC)    Nodule of kidney    Pericardial effusion    PONV (postoperative nausea and vomiting)    Pulmonary embolus ([place]) 2003   Pulmonary hypertension ([place])    followed by Dr Gaynell Face at [facility], now Dr. Maryjean Ka visit end 2'17.   Rectal incontinence    Renal cell carcinoma (HCC)    Scleroderma (HCC)    Tubular adenoma of colon    Uterine polyp    Past Surgical History:  Past Surgical History:  Procedure Laterality Date   APPENDECTOMY  1962   BREAST LUMPECTOMY  1989   left   CATARACT EXTRACTION, BILATERAL Bilateral 12/2013   COLONOSCOPY WITH PROPOFOL N/A 04/15/2021   Procedure: COLONOSCOPY WITH PROPOFOL;  Surgeon: Milus Banister, MD;  Location: [facility] ENDOSCOPY;  Service: Endoscopy;  Laterality: N/A;   ENTEROSCOPY N/A 01/18/2016   Procedure: ENTEROSCOPY;  Surgeon: Mauri Pole, MD;  Location: [facility] ENDOSCOPY;  Service: Endoscopy;  Laterality: N/A;   ENTEROSCOPY N/A 05/24/2018   Procedure: ENTEROSCOPY;  Surgeon: Jackquline Denmark, MD;  Location: [facility] ENDOS[facility];  Service: Endoscopy;  Laterality: N/A;   ENTEROSCOPY N/A 03/29/2021   Procedure: ENTEROSCOPY;  Surgeon: Jerene Bears, MD;  Location: [facility] ENDOS[facility];   Service: Gastroenterology;  Laterality: N/A;   ENTEROSCOPY N/A 04/13/2021   Procedure: ENTEROSCOPY;  Surgeon: Yetta Flock, MD;  Location: [facility] ENDOS[facility];  Service: Gastroenterology;  Laterality: N/A;   ESOPHAGOGASTRODUODENOSCOPY (EGD) WITH PROPOFOL N/A 12/21/2015   Procedure: ESOPHAGOGASTRODUODENOSCOPY (EGD) WITH PROPOFOL;  Surgeon: Irene Shipper, MD;  Location: [facility];  Service: Endoscopy;  Laterality: N/A;   HOT HEMOSTASIS N/A 05/24/2018   Procedure: HOT HEMOSTASIS (ARGON PLASMA COAGULATION/BICAP);  Surgeon: Jackquline Denmark, MD;  [facility];  Service: Endoscopy;  Laterality: N/A;   HOT HEMOSTASIS N/A 03/29/2021   Procedure: HOT HEMOSTASIS (ARGON PLASMA COAGULATION/BICAP);  Surgeon: Jerene Bears, MD;  Location: [facility];  Service: Gastroenterology;  Laterality: N/A;   HOT HEMOSTASIS N/A 04/13/2021   Procedure: HOT HEMOSTASIS (ARGON PLASMA COAGULATION/BICAP);  Surgeon: Yetta Flock, MD;  Location: [facility];  Service: Gastroenterology;  Laterality: N/A;   IR GENERIC HISTORICAL  06/05/2016   IR RADIOLOGIST EVAL & MGMT 06/05/2016 Aletta Edouard, MD [facility] INTERV RAD   IVC Filter     KIDNEY SURGERY     left -"laser surgery by Dr. Kathlene Cote- 5 yrs ago" no removal   SCHLEROTHERAPY  05/24/2018   Procedure: SCHLEROTHERAPY;  Surgeon: Jackquline Denmark, MD;  Location: [facility];  Service: Endoscopy;;   SUBMUCOSAL TATTOO INJECTION  04/13/2021   Procedure: SUBMUCOSAL TATTOO INJECTION;  Surgeon: Yetta Flock, MD;  Location: [facility];  Service: Gastroenterology;;  TONSILLECTOMY     TUBAL LIGATION     HPI:  86 y.o. female admitted with Atypical chest pain with moderate to severe pericardial effusion with acute on chronic diastolic CHF and PNA.  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 We discussed dry mouth resulting from scleroderma and potential for esophageal issues. Pt reports these problems are mild for her and she manages them with good oral care, gluten free diet and reflux medication. Will sign off.Marland Kitchen SLP Visit Diagnosis: Dysphagia, unspecified (R13.10)    Aspiration Risk  Mild aspiration risk    Diet Recommendation Regular;Thin liquid   Liquid Administration via: Cup;Straw Medication Administration: Whole meds with liquid Supervision: Patient able to self feed    Other  Recommendations      Recommendations for follow up therapy are one component of a multi-disciplinary discharge planning process, led by the attending physician.  Recommendations may be updated based on patient status, additional functional criteria and insurance authorization.  Follow up Recommendations No SLP follow up      Assistance Recommended at Discharge None  Functional Status Assessment    Frequency and Duration            Prognosis        Swallow Study   General HPI: 86 y.o. female admitted with Atypical chest pain with moderate to severe pericardial effusion with acute on chronic diastolic CHF and PNA.  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, CREST syndrome, nl cors 2003, anemia, breast CA, renal cell CA,  CKD,   GERD, pulmonary hypertension Type of Study: Bedside Swallow Evaluation Previous Swallow Assessment: none Temperature Spikes Noted: No Respiratory Status: Nasal cannula History of  Recent Intubation: No Behavior/Cognition: Alert;Cooperative;Pleasant mood Oral Cavity Assessment: Within Functional Limits Oral Care Completed by SLP: No Oral Cavity - Dentition: Adequate natural dentition Vision: Functional for self-feeding Self-Feeding Abilities: Able to feed self Patient Positioning: Upright in bed Baseline Vocal Quality: Normal Volitional Cough: Strong Volitional Swallow: Able to elicit    Oral/Motor/Sensory Function     Ice Chips     Thin Liquid Thin Liquid: Within functional limits Presentation: Straw;Self Fed    Nectar Thick Nectar Thick Liquid: Not tested   Honey Thick Honey Thick Liquid: Not tested   Puree Puree: Not tested   Solid     Solid: Within functional limits      Alanda Colton, Katherene Ponto 03/18/2022,11:27 AM

## 2022-03-18 NOTE — ED Notes (Signed)
Breakfast order placed

## 2022-03-18 NOTE — Consult Note (Signed)
Consultation Note Date: 03/18/2022   Patient Name: Paula Ward  DOB: 1936-06-01  MRN: [id number]  Age / Sex: 86 y.o., female  PCP: Hoyt Koch, MD Referring Physician: Thurnell Lose, MD  Reason for Consultation: Establishing goals of care and Psychosocial/spiritual support  HPI/Patient Profile: 86 y.o. 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  Cardiology and pulmonary were consulted.  Patient and her Family face treatment option decisions, advanced directive decisions and anticipatory care needs.   Clinical Assessment and Goals of Care:  This NP Wadie Lessen reviewed medical records, received report from team, assessed the patient and then meet at the patient's bedside  to discuss diagnosis, prognosis, GOC, EOL wishes disposition and options.  Patient has 2 children that live out of town.  She lives with her husband but he has his own health issues, significant memory loss and inability to drive himself to the hospital.   Concept of Palliative Care was introduced as specialized medical care for people and their families living with serious illness.  If focuses on providing relief from the symptoms and stress of a serious illness.  The goal is to improve quality of life for both the patient and the family.Values and  goals of care important to patient and family were attempted to be elicited.  Created space and opportunity for patient  to explore thoughts and feelings regarding current medical situation.  Patient understands the seriousness of her medical situation but remains optimistic telling me " I have lived with all this for twenty years".  She is friendly and upbeat.  She does voice concerns regarding increasing care needs in the home for both herself and her husband.     A  discussion was had today regarding advanced directives.  Concepts specific to code status, artifical feeding and hydration, continued IV antibiotics and rehospitalization was had.    Questions and concerns addressed.  Patient  encouraged to call with questions or concerns.     PMT will continue to support holistically.        Makinzi tells me has documents that are in her locked box at home and will try to recover them, bring them in for scanning.        SUMMARY OF RECOMMENDATIONS    Code Status/Advance Care Planning: Full code Educated patient to consider DNR/DNI status understanding evidenced based poor outcomes in similar hospitalized patient, as the cause of arrest is likely associated with advanced chronic illness rather than an easily reversible  acute cardio-pulmonary event.  ( She thinks she has a DNR from past conversation but for now remains a Full Code)     Additional Recommendations (Limitations, Scope, Preferences): Full Scope Treatment  Psycho-social/Spiritual:  Desire for further Chaplaincy support:     -[employer] -- declines chaplain at this time Additional Recommendations: Education offered on OP palliative services in the home.  Prognosis:  Unable to determine  Discharge Planning: Recommend outpatient palliative services on discharge.  Home with Home Health      Primary Diagnoses: Present on Admission:  Pericardial effusion  Essential hypertension  Anemia, iron  deficiency  Pulmonary hypertension (HCC)  ILD (interstitial lung disease) (HCC)  CKD (chronic kidney disease) stage 3, GFR 30-59 ml/min (HCC)  Acute on chronic diastolic CHF (congestive heart failure) (HCC)  Elevated troponin  Chest pain, atypical  CAP (community acquired pneumonia)   I have reviewed the medical record, interviewed the patient and family, and examined the patient. The following aspects are pertinent.  Past Medical History:  Diagnosis Date   Anemia    Angiodysplasia of stomach and duodenum    Arthus phenomenon    AVM (arteriovenous malformation)    Blood transfusion without reported diagnosis    last 4 units 12-22-15, Iron infusion x2 last -01-07-16,01-14-16.   Breast cancer ([place]) 1989   Left   Candida esophagitis (HCC)    Cataract    Chronic kidney disease    Chronic mild renal insuffiency   CREST syndrome (HCC)    GERD (gastroesophageal reflux disease)    w/ HH   Interstitial lung disease (HCC)    Nodule of kidney    Pericardial effusion    PONV (postoperative nausea and vomiting)    Pulmonary embolus ([place]) 2003   Pulmonary hypertension ([place])    followed by Dr Gaynell Face at [facility], now Dr. Maryjean Ka visit end 2'17.   Rectal incontinence    Renal cell carcinoma (HCC)    Scleroderma (HCC)    Tubular adenoma of colon    Uterine polyp    Social History   Socioeconomic History   Marital status: Married    Spouse name: Not on file   Number of children: 2   Years of education: Not on file   Highest education level: Not on file  Occupational History   Occupation: Retired  Tobacco Use   Smoking status: Never   Smokeless tobacco: Never  Vaping Use   Vaping Use: Never used  Substance and Sexual Activity   Alcohol use: No   Drug use: No   Sexual activity: Not Currently  Other Topics Concern   Not on file  Social History Narrative   Married '611 son- '65, 1 daughter '63; 6 children (2 adopted)SO- SOBR[employer]irement- doing well. Marriage in good health.  Patient has never smoked. Alcohol use- noPt gets regular exercise   Social Determinants of Health   Financial Resource Strain: Not on file  Food Insecurity: Not on file  Transportation Needs: Not on file  Physical Activity: Not on file  Stress: Not on file  Social Connections: Not on file   Family History  Problem Relation Age of Onset   Bladder Cancer Father    Diabetes Father    Prostate cancer Father    Alzheimer's disease Mother    Diabetes Sister    Lung cancer Sister         smoker   Esophageal cancer Paternal Uncle    Colon cancer Neg Hx    Scheduled Meds:  diltiazem  120 mg Oral Daily   montelukast  10 mg Oral QHS   pantoprazole  40 mg Oral BID AC   predniSONE  40 mg Oral Q breakfast   sodium chloride flush  3 mL Intravenous Q12H   spironolactone  12.5 mg Oral Daily   Continuous Infusions:  sodium chloride     [START ON 03/19/2022] cefTRIAXone (ROCEPHIN)  IV     doxycycline (VIBRAMYCIN) IV 100 mg (03/18/22 0843)   PRN Meds:.sodium chloride, acetaminophen **OR** acetaminophen, albuterol, ALPRAZolam, HYDROcodone-acetaminophen, sodium chloride flush, sucralfate Medications Prior to Admission:  Prior to Admission medications   Medication Sig Start Date End Date Taking? Authorizing Provider  acetaminophen (TYLENOL) 325 MG tablet Take 650 mg by mouth every 6 (six) hours as needed for mild pain.    Yes [provider]  albuterol (VENTOLIN HFA) 108 (90 Base) MCG/ACT inhaler INHALE 2 PUFFS INTO THE LUNGS EVERY 6 HOURS AS NEEDED FOR WHEEZING OR SHORTNESS OF BREATH Patient taking differently: Inhale 2 puffs into the lungs every 6 (six) hours as needed for wheezing or shortness of breath. 03/06/20  Yes Hoyt Koch, MD  ALPRAZolam Duanne Moron) 0.25 MG tablet Take 0.5 tablets (0.125 mg total) by mouth at bedtime as needed. Patient taking differently: Take 0.125 mg by mouth at bedtime as needed for anxiety or sleep. 10/29/20  Yes Hoyt Koch, MD   chlorhexidine (PERIDEX) 0.12 % solution 15 mLs by Mouth Rinse route 2 (two) times daily as needed (sore mouth). 12/23/21  Yes [provider]  Cyanocobalamin (VITAMIN B-12 PO) Take 1 tablet by mouth daily. Unsure of dose   Yes [provider]  diltiazem (CARDIZEM CD) 120 MG 24 hr capsule Take 120 mg by mouth daily.   Yes [provider]  fluticasone (FLONASE) 50 MCG/ACT nasal spray INSTILL 1 SPRAY INTO EACH NOSTRIL TWICE A DAY IF NEEDED Patient taking differently: Place 1 spray into both nostrils daily as needed for allergies. 09/18/20  Yes Hoyt Koch, MD  furosemide (LASIX) 20 MG tablet Take 1 tablet by mouth once daily Patient taking differently: Take 20 mg by mouth daily. 12/12/21  Yes Hoyt Koch, MD  guaiFENesin (MUCINEX) 600 MG 12 hr tablet Take 150 mg by mouth daily.   Yes [provider]  methylPREDNISolone (MEDROL) 4 MG tablet Take 2 mg by mouth daily. 01/22/22  Yes [provider]  montelukast (SINGULAIR) 10 MG tablet TAKE 1 TABLET BY MOUTH AT BEDTIME Patient taking differently: Take 10 mg by mouth at bedtime as needed (allergies). 03/03/22  Yes Hoyt Koch, MD  Mouthwashes (DRY MOUTH MOUTHWASH) LIQD Use as directed 5 mLs in the mouth or throat daily as needed (dry mouth). Biotene Patient taking differently: Use as directed 5 mLs in the mouth or throat at bedtime. Biotene 04/01/21  Yes Shelly Coss, MD  Multiple Vitamins-Iron (MULTIVITAMIN/IRON PO) Take 1 tablet by mouth daily.   Yes [provider]  Multiple Vitamins-Minerals (ZINC PO) Take 1 tablet by mouth daily. Unsure of dose   Yes [provider]  OPSUMIT 10 MG TABS Take 10 mg by mouth daily. 08/16/15  Yes [provider]  OXYGEN Inhale into the lungs. 2 LMP at night and upon exertion   Yes [provider]  pantoprazole (PROTONIX) 40 MG tablet Take 1 tablet (40 mg total) by mouth 2 (two) times daily before a meal. 02/27/22   Yes Nandigam, Venia Minks, MD  Pyridoxine HCl (VITAMIN B6 PO) Take 1 tablet by mouth  daily. Unsure of dose   Yes [provider]  spironolactone (ALDACTONE) 25 MG tablet Take 12.5 mg by mouth daily. 06/09/20  Yes [provider]  sucralfate (CARAFATE) 1 GM/10ML suspension Take 1 g by mouth every 6 (six) hours as needed (reflux). 03/01/22  Yes [provider]  sucralfate (CARAFATE) 1 g tablet Take 1 tablet (1 g total) by mouth every 6 (six) hours as needed. Patient not taking: Reported on 03/17/2022 02/07/22   Mauri Pole, MD   Allergies  Allergen Reactions   Codeine Nausea Only    Hallucinations, too   Other Nausea And Vomiting    "-mycin" antibiotics.   Also cause hallucinations.   Erythromycin Nausea And Vomiting   Lisinopril     Pt doesn't remember    Mycophenolate Mofetil Nausea Only   Sulfa Antibiotics Other (See Comments)    unknown   Review of Systems  Constitutional:  Positive for fatigue.  Neurological:  Positive for weakness.   Physical Exam Cardiovascular:     Rate and Rhythm: Normal rate.  Pulmonary:     Effort: Pulmonary effort is normal.  Skin:    General: Skin is warm and dry.  Neurological:     General: No focal deficit present.     Mental Status: She is alert.    Vital Signs: BP (!) 126/59 (BP Location: Right Arm)   Pulse 100   Temp 98.6 F (37 C) (Oral)   Resp 16   Ht _0  (1.6 m)   Wt 46.4 kg   SpO2 100%   BMI 18.12 kg/m  Pain Scale: 0-10   Pain Score: 0-No pain   SpO2: SpO2: 100 % O2 Device:SpO2: 100 % O2 Flow Rate: .O2 Flow Rate (L/min): 2 L/min  IO: Intake/output summary:  Intake/Output Summary (Last 24 hours) at 03/18/2022 0932 Last data filed at 03/18/2022 6712 Gross per 24 hour  Intake 100 ml  Output 400 ml  Net -300 ml    LBM:   Baseline Weight: Weight: 46.4 kg Most recent weight: Weight: 46.4 kg     Palliative Assessment/Data:    PMT will continue to support holistically   Signed by: Wadie Lessen, NP   Please contact Palliative Medicine Team phone at [phone] for questions and concerns.  For individual provider: See Shea Evans

## 2022-03-18 NOTE — Consult Note (Signed)
NAME:  Paula Ward, MRN:  [id number], DOB:  Mar 06, 1936, LOS: 0 ADMISSION DATE:  03/17/2022, CONSULTATION DATE: 03/18/2022 REFERRING MD: Dr. 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   Breast cancer ([place]) 1989   Left   Candida esophagitis (HCC)    Cataract    Chronic kidney disease    Chronic mild renal insuffiency   CREST syndrome (HCC)    GERD (gastroesophageal reflux disease)    w/ HH   Interstitial lung disease (HCC)    Nodule of kidney    Pericardial effusion    PONV (postoperative nausea and vomiting)    Pulmonary embolus ([place]) 2003   Pulmonary hypertension ([place])    followed by Dr Gaynell Face at [facility], now Dr. Maryjean Ka visit end 2'17.   Rectal incontinence    Renal cell carcinoma (HCC)    Scleroderma (HCC)    Tubular adenoma of colon    Uterine polyp     Significant Hospital Events: Including procedures, antibiotic start and stop dates in addition to other pertinent events   Echocardiogram-pending CT scan of the chest IMPRESSION: 1. No evidence for pulmonary embolism. 2. Large pericardial effusion. 3. Mild cardiomegaly. 4. Small bilateral pleural effusions. 5. Bilateral lower  lobe airspace disease compatible with infection. 6. Ground-glass opacities in both lungs favored as edema, although infection is not excluded. Some ground-glass areas are nodular. 7. A follow-up chest CT is recommended in 3 months to re-evaluate and confirm resolution.    Interim History / Subjective:  Cough with sputum production Chest pressure No nausea no vomiting No fevers or chills  Objective   Blood pressure (!) 126/59, pulse 100, temperature 98.6 F (37 C), temperature source Oral, resp. rate 16, height _0  (1.6 m), weight 46.4 kg, SpO2 100 %.        Intake/Output Summary (Last 24 hours) at 03/18/2022 0843 Last data filed at 03/18/2022 3976 Gross per 24 hour  Intake 100 ml  Output 400 ml  Net -300 ml   Filed Weights   03/18/22 0825  Weight: 46.4 kg    Examination: General: Middle-age lady, does not appear to be in distress HENT: Moist oral mucosa Lungs: Bibasal rales Cardiovascular: S1-S2 appreciated Abdomen: Soft, bowel sounds appreciated Extremities: No clubbing, no edema Neuro: Alert and oriented x3 GU: Fair output   Pulmonary function test 02/03/2022 with moderate restrictive disease and moderately reduced diffusing capacity  Resolved Hospital Problem list     Assessment & Plan:  Pulmonary arterial hypertension -On macitentan, did not skip any doses -Continue 10 mg of macitentan daily -There was no plan for changes to this medication during last visit with Dr. Erin Fulling and also during [facility] follow-up  History of crest syndrome Scleroderma Interstitial lung  disease -Was not on oxygen supplementation -Recently participated in pulmonary rehab -Was able to walk up to 20 minutes -Did not feel limited prior to recent symptoms and presentation to the hospital  Possible pneumonia -Likely community-acquired pneumonia -Recommend full course of antibiotics -Addition of steroids should help  Decompensated heart failure -Presence of bilateral small pleural  effusions -  Large pericardial effusion -Had been present since 2017 -Evaluate with echocardiogram for any hemodynamic compromise  -Continue cautious diuresis  Chronic diastolic congestive heart failure -Ejection fraction of 60%  Elevated troponin -Unlikely related to an acute coronary event  GI bleed due to AVMs -Continue PPI  Mild aortic stenosis, mitral valve disease with calcified annulus Peak pulmonary pressures of 39 -Evaluate with echocardiogram  As IVC in place for history of pulmonary embolism -Recent CTA negative  History of renal cancer  Denies any significant shortness of breath or wheezing at present -Had been on albuterol in the past  Stage IIIa chronic kidney disease  Start prednisone 40 mg -Daily for 5 days  Best Practice (right click and "Reselect all SmartList Selections" daily)   Diet/type: Regular consistency (see orders) DVT prophylaxis: SCD GI prophylaxis: PPI Lines: N/A Foley:  N/A Code Status:  full code Last date of multidisciplinary goals of care discussion [pending]  Labs   CBC: Recent Labs  Lab 03/12/22 1247 03/17/22 1621 03/18/22 0147  WBC 7.6 10.9* 9.7  NEUTROABS 6.1 9.7*  --   HGB 11.9* 11.0* 11.5*  HCT 36.0 32.8* 35.8*  MCV 108.5* 107.9* 110.8*  PLT 208.0 215 709    Basic Metabolic Panel: Recent Labs  Lab 03/12/22 1247 03/17/22 1621 03/18/22 0147  NA 137 133* 132*  K 3.8 4.0 3.9  CL 98 98 99  CO2 _0 GLUCOSE 101* 165* 107*  BUN 36* 33* 31*  CREATININE 1.26* 1.24* 1.20*  CALCIUM 9.7 9.0 9.0  MG  --   --  1.9  PHOS  --   --  3.3   GFR: Estimated Creatinine Clearance: 26 mL/min (A) (by C-G formula based on SCr of 1.2 mg/dL (H)). Recent Labs  Lab 03/12/22 1247 03/17/22 1621 03/18/22 0147  PROCALCITON  --   --  0.52  WBC 7.6 10.9* 9.7    Liver Function Tests: Recent Labs  Lab 03/12/22 1247 03/17/22 1621 03/18/22 0147  AST _1 ALT _2 ALKPHOS 58 54 54  54  BILITOT  0.6 1.0 0.6  0.8  PROT 7.1 6.9 6.7  6.7  ALBUMIN 4.3 3.6 3.4*  3.4*   No results for input(s): LIPASE, AMYLASE in the last 168 hours. No results for input(s): AMMONIA in the last 168 hours.  ABG    Component Value Date/Time   HCO3 26.1 03/29/2021 0534   TCO2 23 06/15/2012 1158   O2SAT 97.1 03/29/2021 0534     Coagulation Profile: No results for input(s): INR, PROTIME in the last 168 hours.  Cardiac Enzymes: Recent Labs  Lab 03/18/22 0147  CKTOTAL 56    HbA1C: Hgb A1c MFr Bld  Date/Time Value Ref Range Status  01/06/2020 10:42 AM 5.2 4.6 - 6.5 % Final    Comment:    Glycemic Control Guidelines for People with Diabetes:Non Diabetic:  <6%Goal of Therapy: <7%Additional Action Suggested:  >8%     CBG: No results for input(s): GLUCAP in the last 168 hours.  Review of Systems:   Chest pressure, shortness of breath No pleuritic chest pain No  fevers, no chills  Past Medical History:  She,  has a past medical history of Anemia, Angiodysplasia of stomach and duodenum, Arthus phenomenon, AVM (arteriovenous malformation), Blood transfusion without reported diagnosis, Breast cancer ([place]) (1989), Candida esophagitis ([place]), Cataract, Chronic kidney disease, CREST syndrome ([place]), GERD (gastroesophageal reflux disease), Interstitial lung disease ([place]), Nodule of kidney, Pericardial effusion, PONV (postoperative nausea and vomiting), Pulmonary embolus ([place]) (2003), Pulmonary hypertension ([place]), Rectal incontinence, Renal cell carcinoma ([place]), Scleroderma ([place]), Tubular adenoma of colon, and Uterine polyp.   Surgical History:   Past Surgical History:  Procedure Laterality Date   APPENDECTOMY  1962   BREAST LUMPECTOMY  1989   left   CATARACT EXTRACTION, BILATERAL Bilateral 12/2013   COLONOSCOPY WITH PROPOFOL N/A 04/15/2021   Procedure: COLONOSCOPY WITH PROPOFOL;  Surgeon: Milus Banister, MD;  Location: [facility];  Service: Endoscopy;  Laterality: N/A;   ENTEROSCOPY N/A  01/18/2016   Procedure: ENTEROSCOPY;  Surgeon: Mauri Pole, MD;  Location: [facility];  Service: Endoscopy;  Laterality: N/A;   ENTEROSCOPY N/A 05/24/2018   Procedure: ENTEROSCOPY;  Surgeon: Jackquline Denmark, MD;  Location: [facility];  Service: Endoscopy;  Laterality: N/A;   ENTEROSCOPY N/A 03/29/2021   Procedure: ENTEROSCOPY;  Surgeon: Jerene Bears, MD;  Location: [facility];  Service: Gastroenterology;  Laterality: N/A;   ENTEROSCOPY N/A 04/13/2021   Procedure: ENTEROSCOPY;  Surgeon: Yetta Flock, MD;  Location: [facility];  Service: Gastroenterology;  Laterality: N/A;   ESOPHAGOGASTRODUODENOSCOPY (EGD) WITH PROPOFOL N/A 12/21/2015   Procedure: ESOPHAGOGASTRODUODENOSCOPY (EGD) WITH PROPOFOL;  Surgeon: Irene Shipper, MD;  [facility]: [facility];  Service: Endoscopy;  Laterality: N/A;   HOT HEMOSTASIS N/A 05/24/2018   Procedure: HOT HEMOSTASIS (ARGON PLASMA COAGULATION/BICAP);  Surgeon: Jackquline Denmark, MD;  Location[facility];  Service: Endoscopy;  Laterality: N/A;   HOT HEMOSTASIS N/A 03/29/2021   Procedure: HOT HEMOSTASIS (ARGON PLASMA COAGULATION/BICAP);  Surgeon: Jerene Bears, MD;  Location: [facility];  Service: Gastroenterology;  Laterality: N/A;   HOT HEMOSTASIS N/A 04/13/2021   Procedure: HOT HEMOSTASIS (ARGON PLASMA COAGULATION/BICAP);  Surgeon: Yetta Flock, MD;  Location: [facility];  Service: Gastroenterology;  Laterality: N/A;   IR GENERIC HISTORICAL  06/05/2016   IR RADIOLOGIST EVAL & MGMT 06/05/2016 Aletta Edouard, MD [facility] INTERV RAD   IVC Filter     KIDNEY SURGERY     left -"laser surgery by Dr. Kathlene Cote- 5 yrs ago" no removal   SCHLEROTHERAPY  05/24/2018   Procedure: SCHLEROTHERAPY;  Surgeon: Jackquline Denmark, MD;  [facility];  Service: Endoscopy;;   SUBMUCOSAL TATTOO INJECTION  04/13/2021   Procedure: SUBMUCOSAL TATTOO INJECTION;  Surgeon: Yetta Flock, MD;  Location: [facility];  Service: Gastroenterology;;   TONSILLECTOMY      TUBAL LIGATION       Social History:   reports that she has never smoked. She has never used smokeless tobacco. She reports that she does not drink alcohol and does not use drugs.   Family History:  Her family history includes Alzheimer's disease in her mother; Bladder Cancer in her father; Diabetes in her father and sister; Esophageal cancer in her paternal uncle; Lung cancer in her sister; Prostate cancer in her father. There is no history of Colon cancer.   Allergies Allergies  Allergen Reactions   Codeine Nausea Only    Hallucinations, too   Other Nausea And Vomiting    "-mycin" antibiotics.   Also cause hallucinations.   Erythromycin Nausea And Vomiting   Lisinopril  Pt doesn't remember    Mycophenolate Mofetil Nausea Only   Sulfa Antibiotics Other (See Comments)    unknown     Home Medications  Prior to Admission medications   Medication Sig Start Date End Date Taking? Authorizing Provider  acetaminophen (TYLENOL) 325 MG tablet Take 650 mg by mouth every 6 (six) hours as needed for mild pain.    Yes [provider]  albuterol (VENTOLIN HFA) 108 (90 Base) MCG/ACT inhaler INHALE 2 PUFFS INTO THE LUNGS EVERY 6 HOURS AS NEEDED FOR WHEEZING OR SHORTNESS OF BREATH Patient taking differently: Inhale 2 puffs into the lungs every 6 (six) hours as needed for wheezing or shortness of breath. 03/06/20  Yes Hoyt Koch, MD  ALPRAZolam Duanne Moron) 0.25 MG tablet Take 0.5 tablets (0.125 mg total) by mouth at bedtime as needed. Patient taking differently: Take 0.125 mg by mouth at bedtime as needed for anxiety or sleep. 10/29/20  Yes Hoyt Koch, MD  chlorhexidine (PERIDEX) 0.12 % solution 15 mLs by Mouth Rinse route 2 (two) times daily as needed (sore mouth). 12/23/21  Yes [provider]  Cyanocobalamin (VITAMIN B-12 PO) Take 1 tablet by mouth daily. Unsure of dose   Yes [provider]  diltiazem (CARDIZEM CD) 120 MG 24 hr capsule Take 120 mg by  mouth daily.   Yes [provider]  fluticasone (FLONASE) 50 MCG/ACT nasal spray INSTILL 1 SPRAY INTO EACH NOSTRIL TWICE A DAY IF NEEDED Patient taking differently: Place 1 spray into both nostrils daily as needed for allergies. 09/18/20  Yes Hoyt Koch, MD  furosemide (LASIX) 20 MG tablet Take 1 tablet by mouth once daily Patient taking differently: Take 20 mg by mouth daily. 12/12/21  Yes Hoyt Koch, MD  guaiFENesin (MUCINEX) 600 MG 12 hr tablet Take 150 mg by mouth daily.   Yes [provider]  methylPREDNISolone (MEDROL) 4 MG tablet Take 2 mg by mouth daily. 01/22/22  Yes [provider]  montelukast (SINGULAIR) 10 MG tablet TAKE 1 TABLET BY MOUTH AT BEDTIME Patient taking differently: Take 10 mg by mouth at bedtime as needed (allergies). 03/03/22  Yes Hoyt Koch, MD  Mouthwashes (DRY MOUTH MOUTHWASH) LIQD Use as directed 5 mLs in the mouth or throat daily as needed (dry mouth). Biotene Patient taking differently: Use as directed 5 mLs in the mouth or throat at bedtime. Biotene 04/01/21  Yes Shelly Coss, MD  Multiple Vitamins-Iron (MULTIVITAMIN/IRON PO) Take 1 tablet by mouth daily.   Yes [provider]  Multiple Vitamins-Minerals (ZINC PO) Take 1 tablet by mouth daily. Unsure of dose   Yes [provider]  OPSUMIT 10 MG TABS Take 10 mg by mouth daily. 08/16/15  Yes [provider]  OXYGEN Inhale into the lungs. 2 LMP at night and upon exertion   Yes [provider]  pantoprazole (PROTONIX) 40 MG tablet Take 1 tablet (40 mg total) by mouth 2 (two) times daily before a meal. 02/27/22  Yes Nandigam, Venia Minks, MD  Pyridoxine HCl (VITAMIN B6 PO) Take 1 tablet by mouth daily. Unsure of dose   Yes [provider]  spironolactone (ALDACTONE) 25 MG tablet Take 12.5 mg by mouth daily. 06/09/20  Yes [provider]  sucralfate (CARAFATE) 1 GM/10ML suspension Take 1 g by mouth every 6 (six)  hours as needed (reflux). 03/01/22  Yes [provider]  sucralfate (CARAFATE) 1 g tablet Take 1 tablet (1 g total) by mouth every 6 (six) hours as  needed. Patient not taking: Reported on 03/17/2022 02/07/22   Mauri Pole, MD    Sherrilyn Rist, MD [facility] Pager: See Shea Evans

## 2022-03-19 DIAGNOSIS — N179 Acute kidney failure, unspecified: Secondary | ICD-10-CM | POA: Diagnosis not present

## 2022-03-19 DIAGNOSIS — J189 Pneumonia, unspecified organism: Secondary | ICD-10-CM | POA: Diagnosis not present

## 2022-03-19 DIAGNOSIS — I3139 Other pericardial effusion (noninflammatory): Secondary | ICD-10-CM | POA: Diagnosis not present

## 2022-03-19 DIAGNOSIS — N1832 Chronic kidney disease, stage 3b: Secondary | ICD-10-CM | POA: Diagnosis not present

## 2022-03-19 DIAGNOSIS — I5033 Acute on chronic diastolic (congestive) heart failure: Secondary | ICD-10-CM | POA: Diagnosis not present

## 2022-03-19 DIAGNOSIS — I272 Pulmonary hypertension, unspecified: Secondary | ICD-10-CM | POA: Diagnosis not present

## 2022-03-19 LAB — BASIC METABOLIC PANEL
Anion gap: 11 (ref 5–15)
BUN: 43 mg/dL — ABNORMAL HIGH (ref 8–23)
CO2: 23 mmol/L (ref 22–32)
Calcium: 9.1 mg/dL (ref 8.9–10.3)
Chloride: 100 mmol/L (ref 98–111)
Creatinine, Ser: 1.78 mg/dL — ABNORMAL HIGH (ref 0.44–1.00)
GFR, Estimated: 28 mL/min — ABNORMAL LOW (ref >60)
Glucose, Bld: 115 mg/dL — ABNORMAL HIGH (ref 70–99)
Potassium: 4.5 mmol/L (ref 3.5–5.1)
Sodium: 134 mmol/L — ABNORMAL LOW (ref 135–145)

## 2022-03-19 LAB — LEGIONELLA PNEUMOPHILA SEROGP 1 UR AG: L. pneumophila Serogp 1 Ur Ag: NEGATIVE

## 2022-03-19 MED ORDER — DOXYCYCLINE HYCLATE 100 MG PO TABS
100.0000 mg | ORAL_TABLET | Freq: Two times a day (BID) | ORAL | Status: DC
Start: 2022-03-20 — End: 2022-03-22
  Administered 2022-03-20 – 2022-03-22 (×5): 100 mg via ORAL
  Filled 2022-03-19 (×5): qty 1

## 2022-03-19 MED ORDER — ADULT MULTIVITAMIN W/MINERALS CH
1.0000 | ORAL_TABLET | Freq: Every day | ORAL | Status: DC
  Administered 2022-03-19 – 2022-03-21 (×3): 1 via ORAL
  Filled 2022-03-19 (×4): qty 1

## 2022-03-19 MED ORDER — PREDNISONE 20 MG PO TABS
20.0000 mg | ORAL_TABLET | Freq: Every day | ORAL | Status: DC
  Administered 2022-03-20 – 2022-03-22 (×3): 20 mg via ORAL
  Filled 2022-03-19 (×3): qty 1

## 2022-03-19 MED ORDER — CEFDINIR 300 MG PO CAPS
300.0000 mg | ORAL_CAPSULE | Freq: Two times a day (BID) | ORAL | Status: DC
  Administered 2022-03-20 – 2022-03-22 (×5): 300 mg via ORAL
  Filled 2022-03-19 (×6): qty 1

## 2022-03-19 MED ORDER — BOOST / RESOURCE BREEZE PO LIQD CUSTOM
1.0000 | Freq: Three times a day (TID) | ORAL | Status: DC
  Administered 2022-03-19 – 2022-03-22 (×9): 1 via ORAL

## 2022-03-19 NOTE — Progress Notes (Addendum)
Physical Therapy Treatment Patient Details Name: Paula Ward MRN: [id number] DOB: 05/21/1936 Today's Date: 03/19/2022   History of Present Illness 86 y.o. female admitted with Atypical chest pain with moderate to severe pericardial effusion with acute on chronic diastolic CHF and PNA.  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, CREST syndrome, nl cors 2003, anemia, breast CA, renal cell CA,  CKD,   GERD, pulmonary hypertension    PT Comments    Pt is progressing well with therapy. Pt continues to require supervision for transfers and min assist/guard during gait with no AD. Pt limited by mild balance deficits during functional mobility and therapeutic balance activities completed for balance training. Pt will benefit from skilled continued PT. Recommend pt return home with OPPT for balance and strength training. Will progress as able.    Recommendations for follow up therapy are one component of a multi-disciplinary discharge planning process, led by the attending physician.  Recommendations may be updated based on patient status, additional functional criteria and insurance authorization.  Follow Up Recommendations  Outpatient PT     Assistance Recommended at Discharge Intermittent Supervision/Assistance  Patient can return home with the following A little help with walking and/or transfers;Assistance with cooking/housework;Assist for transportation   Equipment Recommendations  None recommended by PT    Recommendations for Other Services       Precautions / Restrictions Precautions Precautions: Fall Precaution Comments: monitor HR and O2 Restrictions Weight Bearing Restrictions: No     Mobility  Bed Mobility                    Transfers       Sit  to Stand: Supervision, Min guard   Step pivot transfers: Supervision       General transfer comment: pt supervision from EOB and min guard from toilet with cues to use grab bar. EOS pt able to stand and pivot from recliner to bed with use of Bil UE to steady self and supervision for safety.    Ambulation/Gait Ambulation/Gait assistance: Min guard, Min assist   Assistive device: None, 1 person hand held assist         General Gait Details: Pt ambulated with HHA initially and progress to no device. pt steady throughout with no LOB or decrease in stability without UE support.   Stairs             Wheelchair Mobility    Modified Rankin (Stroke Patients Only)       Balance   Sitting-balance support: No upper extremity supported, Feet supported   Sitting balance - Comments: pt able to complete pericare and don socks sitting, good weight shifting with no LOB   Standing balance support: No upper extremity supported, During functional activity Standing balance-Leahy Scale: Good Standing balance comment: amb with 1HHA and no support, steady with no major LOB     Tandem Stance - Right Leg: 20 (2x10) Tandem Stance - Left Leg: 20 (2x10)       High Level Balance Comments: SLS marching with bil UE support at sink in room. pt tapping trash bin and then shoe, 10 reps bil LE. Gait activities: weave between diamonds on floor. min guard, pt avoided diamonds on first bout and on later bout was not able to fully step around. stepping over line - pt  unable to fully clear box on floor and adjust steps leading up to obstacle to step prepare for longer step length.            Cognition                                                Exercises      General Comments        Pertinent Vitals/Pain      Home Living                          Prior Function            PT Goals (current goals can now be found in the care plan section) Acute  Rehab PT Goals Patient Stated Goal: to go home PT Goal Formulation: With patient Time For Goal Achievement: 04/01/22 Potential to Achieve Goals: Good Progress towards PT goals: Progressing toward goals    Frequency    Min 2X/week      PT Plan Current plan remains appropriate    Co-evaluation              AM-PAC PT "6 Clicks" Mobility   Outcome Measure  Help needed turning from your back to your side while in a flat bed without using bedrails?: None Help needed moving from lying on your back to sitting on the side of a flat bed without using bedrails?: None Help needed moving to and from a bed to a chair (including a wheelchair)?: A Little Help needed standing up from a chair using your arms (e.g., wheelchair or bedside chair)?: A Little Help needed to walk in hospital room?: A Little Help needed climbing 3-5 steps with a railing? : A Little 6 Click Score: 20    End of Session Equipment Utilized During Treatment: Gait belt Activity Tolerance: Patient tolerated treatment well Patient left: in bed;with call bell/phone within reach Nurse Communication: Mobility status PT Visit Diagnosis: Unsteadiness on feet (R26.81)     Time: 6016-5800 PT Time Calculation (min) (ACUTE ONLY): 32 min  Charges:  $Gait Training: 8-22 mins $Therapeutic Activity: 8-22 mins                     Paula Ward, SPT [facility]    I agree with the following treatment note after reviewing documentation. This session was performed under the supervision of a licensed clinician.    SAMARIE PINDER 03/19/2022, 12:11 PM

## 2022-03-19 NOTE — Progress Notes (Signed)
PROGRESS NOTE  Paula Ward  XVQ:[id number] DOB: 27-Aug-1936 DOA: 03/17/2022 PCP: Hoyt Koch, MD   Brief Narrative: Patient is 86 year old female with history of pulm hypertension secondary to limited scleroderma, on oxygen at nighttime at home, chronic pericardial effusion, moderate aortic stenosis, PE, GI bleed secondary to AVMs status post IVC filter, crest syndrome, breast cancer, renal cell carcinoma, CKD stage II, GERD who presented here with complaint of substernal chest pain, worsening shortness of breath from baseline.  In the emergency department CT angiogram was done which showed large pericardial effusion along with possible pneumonia.  Admitted for further management.  Cardiology, Pulmonology consulted.  Assessment & Plan:  Principal Problem:   Pericardial effusion Active Problems:   Chest pain, atypical   Acute on chronic diastolic CHF (congestive heart failure) (HCC)   Anemia, iron deficiency   Pulmonary hypertension (HCC)   Essential hypertension   CKD (chronic kidney disease) stage 3, GFR 30-59 ml/min (HCC)   ILD (interstitial lung disease) (HCC)   Elevated troponin   CAP (community acquired pneumonia)   Pressure injury of skin   Acute on chronic hypoxic respiratory failure: Secondary to multifactorial etiology: Pneumonia, ILD, pericardial effusion, chronic diastolic congestive heart failure.  She is oxygen at home at nighttime.  Currently requiring 2 L of oxygen per minute.  We will try to wean the oxygen.  Community-acquired pneumonia: Continue current oral antibiotics to complete 7 days course.  Steroids added.CT angio showed bilateral lower lobe airspace disease compatible with infection.  AKI on CKD stage II: Baseline creatinine around 1.2.  Creatinine trended up to 1.7 likely from contrast insult.  Continue gentle IV fluids..  Check BMP tomorrow  Pulmonary artery hypertension: Pulmonology following here.  On macitentan at home, continue.  Follows with  pulmonology as outpatient. Has history of of ILD/crest syndrome/scleroderma  Large pericardial effusion: Has been there since 2017.  Cardiology following here.  Echo done here showed large pericardial effusion surrounding the heart, no evidence of tamponade.  Chest pressure: Likely atypical but cardiology recommending coronary CT angiogram when appropriate.  Mildly elevated troponin with flat trend.  No ischemic changes in the EKG.  History of moderate aortic stenosis: Follows with cardiology as an outpatient  Chronic diastolic congestive heart failure: Given IV Lasix here.  Echo done here showed EF of 55 to 09%, grade 1 diastolic dysfunction.  History of GI bleed: Suspected to be from AVMs.  On PPI  History of PE: Recent CTA negative for PE.  On IVC filter  Stage IIIa CKD: Currently kidney function at baseline.  History of renal cancer/breast cancer: Currently in remission  Debility/deconditioning: Patient seen by PT/OT and  recommended outpatient follow-up         Pressure Injury 03/18/22 Buttocks Right Stage 1 -  Intact skin with non-blanchable redness of a localized area usually over a bony prominence. (Active)  03/18/22 0825  Location: Buttocks  Location Orientation: Right  Staging: Stage 1 -  Intact skin with non-blanchable redness of a localized area usually over a bony prominence.  Wound Description (Comments):   Present on Admission:   Dressing Type Foam - Lift dressing to assess site every shift 03/19/22 0744    DVT prophylaxis:SCDs Start: 03/17/22 2233     Code Status: Full Code  Family Communication: None at bedside  Patient status:Inpatient  Patient is from :Home  Anticipated discharge TO:[facility]  Estimated DC date:after complete work up   Consultants: Cardiology, pulmonology  Procedures: None  Antimicrobials:  Anti-infectives (From admission,  onward)    Start     Dose/Rate Route Frequency Ordered Stop   03/19/22 0000  cefTRIAXone (ROCEPHIN) 2 g  in sodium chloride 0.9 % 100 mL IVPB        2 g 200 mL/hr over 30 Minutes Intravenous Every 24 hours 03/18/22 0128 03/22/22 2359   03/18/22 0145  doxycycline (VIBRAMYCIN) 100 mg in sodium chloride 0.9 % 250 mL IVPB        100 mg 125 mL/hr over 120 Minutes Intravenous Every 12 hours 03/18/22 0128     03/18/22 0145  cefTRIAXone (ROCEPHIN) 2 g in sodium chloride 0.9 % 100 mL IVPB        2 g 200 mL/hr over 30 Minutes Intravenous  Once 03/18/22 0140 03/18/22 5789       Subjective: Patient seen and examined at bedside this morning.  Hemodynamically stable.  Lying in bed, comfortable, denies any chest pressure or shortness of breath during my evaluation.  On room air during my evaluation  Objective: Vitals:   03/18/22 1615 03/18/22 2011 03/19/22 0015 03/19/22 0344  BP: (!) 123/57 (!) 119/55 (!) 114/54 (!) 137/56  Pulse: 93 94 92 88  Resp: _0 Temp: 97.7 F (36.5 C) 98.9 F (37.2 C) 97.8 F (36.6 C) (!) 97.4 F (36.3 C)  TempSrc: Oral Oral Oral Oral  SpO2: 96% 93% 90% 92%  Weight:    47.9 kg  Height:        Intake/Output Summary (Last 24 hours) at 03/19/2022 0805 Last data filed at 03/19/2022 0348 Gross per 24 hour  Intake 944.82 ml  Output 340 ml  Net 604.82 ml   Filed Weights   03/18/22 0825 03/19/22 0344  Weight: 46.4 kg 47.9 kg    Examination:  General exam: Overall comfortable, not in distress, pleasant elderly female, deconditioned, chronically ill looking. HEENT: PERRL Respiratory system: Bilateral coarse crackles Cardiovascular system: S1 & S2 heard, RRR.  Gastrointestinal system: Abdomen is nondistended, soft and nontender. Central nervous system: Alert and oriented Extremities: No edema, no clubbing ,no cyanosis Skin: No rashes, no ulcers,no icterus     Data Reviewed: I have personally reviewed following labs and imaging studies  CBC: Recent Labs  Lab 03/12/22 1247 03/14/22 1048 03/17/22 1621 03/18/22 0147  WBC 7.6  --  10.9* 9.7  NEUTROABS  6.1  --  9.7*  --   HGB 11.9* 11.7* 11.0* 11.5*  HCT 36.0  --  32.8* 35.8*  MCV 108.5*  --  107.9* 110.8*  PLT 208.0  --  215 784   Basic Metabolic Panel: Recent Labs  Lab 03/12/22 1247 03/17/22 1621 03/18/22 0147  NA 137 133* 132*  K 3.8 4.0 3.9  CL 98 98 99  CO2 _1 GLUCOSE 101* 165* 107*  BUN 36* 33* 31*  CREATININE 1.26* 1.24* 1.20*  CALCIUM 9.7 9.0 9.0  MG  --   --  1.9  PHOS  --   --  3.3     Recent Results (from the past 240 hour(s))  MRSA Next Gen by PCR, Nasal     Status: None   Collection Time: 03/18/22  1:27 AM   Specimen: Nasal Mucosa; Nasal Swab  Result Value Ref Range Status   MRSA by PCR Next Gen NOT DETECTED NOT DETECTED Final    Comment: (NOTE) The GeneXpert MRSA Assay (FDA approved for NASAL specimens only), is one component of a comprehensive MRSA colonization surveillance program. It is not intended to diagnose  MRSA infection nor to guide or monitor treatment for MRSA infections. Test performance is not FDA approved in patients less than 64 years old. Performed at [facility], [place] [address] [place], [place] [address]      Radiology Studies: CT Angio Chest Pulmonary Embolism (PE) W or WO Contrast  Result Date: 03/18/2022 CLINICAL DATA:  Chest pain EXAM: CT ANGIOGRAPHY CHEST WITH CONTRAST TECHNIQUE: Multidetector CT imaging of the chest was performed using the standard protocol during bolus administration of intravenous contrast. Multiplanar CT image reconstructions and MIPs were obtained to evaluate the vascular anatomy. RADIATION DOSE REDUCTION: This exam was performed according to the departmental dose-optimization program which includes automated exposure control, adjustment of the mA and/or kV according to patient size and/or use of iterative reconstruction technique. CONTRAST:  47m OMNIPAQUE IOHEXOL 350 MG/ML SOLN COMPARISON:  CT chest 03/30/2021 FINDINGS: Cardiovascular: The heart is mildly enlarged. There is a large pericardial  effusion which is low-density. Aorta is normal in size. There are atherosclerotic calcifications of the aorta. There is adequate opacification of the pulmonary arteries to the segmental level. There is no evidence for pulmonary embolism. Mediastinum/Nodes: No enlarged mediastinal, hilar, or axillary lymph nodes. Thyroid gland, trachea, and esophagus demonstrate no significant findings. Lungs/Pleura: There are small bilateral pleural effusions, left greater than right. There is airspace consolidation of the inferior left lower lobe and superior segment of the right lower lobe. There are patchy and confluent ground-glass opacities bilaterally. There also ill-defined ground-glass nodular densities measuring up to 8 mm in the upper lungs. Peripheral reticular opacities in peripheral patchy airspace opacities persist and have mildly increased in the left upper lobe. Trachea and central airways appear patent. There is no evidence for pneumothorax. Upper Abdomen: No acute abnormality. Musculoskeletal: No chest wall abnormality. No acute or significant osseous findings. Review of the MIP images confirms the above findings. IMPRESSION: 1. No evidence for pulmonary embolism. 2. Large pericardial effusion. 3. Mild cardiomegaly. 4. Small bilateral pleural effusions. 5. Bilateral lower lobe airspace disease compatible with infection. 6. Ground-glass opacities in both lungs favored as edema, although infection is not excluded. Some ground-glass areas are nodular. 7. A follow-up chest CT is recommended in 3 months to re-evaluate and confirm resolution. Electronically Signed   By: ARonney AstersM.D.   On: 03/18/2022 00:03   DG Chest Port 1 View  Result Date: 03/17/2022 CLINICAL DATA:  Shortness of breath.  Weakness. EXAM: PORTABLE CHEST 1 VIEW COMPARISON:  03/30/2021 FINDINGS: Marked cardiac enlargement is stable. Diffuse pulmonary interstitial prominence also stable. Infiltrate or atelectasis in left lower lobe also shows no  significant change. IMPRESSION: Left lobe atelectasis versus infiltrate, without significant change. Stable cardiomegaly and diffuse pulmonary interstitial prominence. Electronically Signed   By: JMarlaine HindM.D.   On: 03/17/2022 17:06   ECHOCARDIOGRAM COMPLETE  Result Date: 03/18/2022    ECHOCARDIOGRAM REPORT   Patient Name:   SLAVAUGHN BISIGDate of Exam: 03/18/2022 Medical Rec #:  [id number]      Height:       63.0 in Accession #:    [id number]     Weight:       102.3 lb Date of Birth:  117-Dec-1937      BSA:          1.454 m Patient Age:    89years        BP:           126/59 mmHg Patient Gender: F  HR:           90 bpm. Exam Location:  Inpatient Procedure: 2D Echo, Cardiac Doppler, Color Doppler and Strain Analysis Indications:    Pericardial effusion  History:        Patient has prior history of Echocardiogram examinations, most                 recent 09/08/2018. Pericardial Disease; Pulmonary HTN. AICD                 04/15/21.  Sonographer:    Luisa Hart RDCS Referring Phys: [place]  1. Left ventricular ejection fraction, by estimation, is 55 to 60%. The left ventricle has normal function. The left ventricle has no regional wall motion abnormalities. There is moderate left ventricular hypertrophy. Left ventricular diastolic parameters are consistent with Grade I diastolic dysfunction (impaired relaxation). Elevated left atrial pressure.  2. Right ventricular systolic function is normal. The right ventricular size is normal.  3. Left atrial size was severely dilated.  4. Large pericardial effusion surrounds heart Most prominent along lateral side of LV 24 mm in maximal dimension. There is no evidence by echo criteria for hemodynamic compromise. Mitral inflow shows no signficant respiratory variation, there is no RV collapse, IVC is normal size. . Large pleural effusion.  5. Signficant mitral annular calcification. Mitral leaflets are thickened with moderately  restricted motion. Peak and mean gradients through the valve are 12 and 7 mm Hg respectively. (HR =91 bpm). The mitral valve is abnormal. Trivial mitral valve regurgitation. No evidence of mitral stenosis. Moderate to severe mitral annular calcification.  6. AV is thickened, calcified with restricted motion. Peak and mean gradients through the valve are 45 and 25 mm Hg respectively Dimensionless index is 0.26. Overall consistent with moderate AS. Compared to echo report from Jan 2023 [facility]), no significant change. . The aortic valve is tricuspid. Aortic valve regurgitation is not visualized. FINDINGS  Left Ventricle: Left ventricular ejection fraction, by estimation, is 55 to 60%. The left ventricle has normal function. The left ventricle has no regional wall motion abnormalities. The left ventricular internal cavity size was small. There is moderate  left ventricular hypertrophy. Left ventricular diastolic parameters are consistent with Grade I diastolic dysfunction (impaired relaxation). Elevated left atrial pressure. Right Ventricle: The right ventricular size is normal. Right vetricular wall thickness was not assessed. Right ventricular systolic function is normal. Left Atrium: Left atrial size was severely dilated. Right Atrium: Right atrial size was normal in size. Pericardium: Large pericardial effusion surrounds heart Most prominent along lateral side of LV 24 mm in maximal dimension. There is no evidence by echo criteria for hemodynamic compromise. Mitral inflow shows no signficant respiratory variation, there is no RV collapse, IVC is normal size. Mitral Valve: Signficant mitral annular calcification. Mitral leaflets are thickened with moderately restricted motion. Peak and mean gradients through the valve are 12 and 7 mm Hg respectively. (HR =91 bpm). The mitral valve is abnormal. There is moderate thickening of the mitral valve leaflet(s). There is mild calcification of the mitral valve leaflet(s).  Moderate to severe mitral annular calcification. Trivial mitral valve regurgitation. No evidence of mitral valve stenosis. MV peak gradient, 12.4 mmHg. The mean mitral valve gradient is 7.0 mmHg. Tricuspid Valve: The tricuspid valve is normal in structure. Tricuspid valve regurgitation is mild. Aortic Valve: AV is thickened, calcified with restricted motion. Peak and mean gradients through the valve are 45 and 25 mm Hg respectively Dimensionless index is 0.26.  Overall consistent with moderate AS. Compared to echo report from Jan 2023 [facility]), no  significant change. The aortic valve is tricuspid. Aortic valve regurgitation is not visualized. Aortic valve mean gradient measures 26.0 mmHg. Aortic valve peak gradient measures 61.2 mmHg. Aortic valve area, by VTI measures 0.77 cm. Pulmonic Valve: The pulmonic valve was grossly normal. Pulmonic valve regurgitation is not visualized. Aorta: The aortic root and ascending aorta are structurally normal, with no evidence of dilitation. IAS/Shunts: No atrial level shunt detected by color flow Doppler. Additional Comments: There is a large pleural effusion.  LEFT VENTRICLE PLAX 2D LVIDd:         3.30 cm     Diastology LVIDs:         2.00 cm     LV e' medial:    3.68 cm/s LV PW:         1.50 cm     LV E/e' medial:  38.2 LV IVS:        1.60 cm     LV e' lateral:   3.59 cm/s LVOT diam:     1.55 cm     LV E/e' lateral: 39.1 LV SV:         49 LV SV Index:   34 LVOT Area:     1.89 cm  LV Volumes (MOD) LV vol d, MOD A2C: 56.2 ml LV vol d, MOD A4C: 62.1 ml LV vol s, MOD A2C: 23.6 ml LV vol s, MOD A4C: 27.0 ml LV SV MOD A2C:     32.6 ml LV SV MOD A4C:     62.1 ml LV SV MOD BP:      35.7 ml RIGHT VENTRICLE RV Basal diam:  3.30 cm RV Mid diam:    1.70 cm RV S prime:     14.30 cm/s TAPSE (M-mode): 1.2 cm LEFT ATRIUM              Index        RIGHT ATRIUM           Index LA Vol (A2C):   126.0 ml 86.64 ml/m  RA Area:     13.30 cm LA Vol (A4C):   86.6 ml  59.55 ml/m  RA Volume:   26.80 ml   18.43 ml/m LA Biplane Vol: 109.0 ml 74.95 ml/m  AORTIC VALVE                     PULMONIC VALVE AV Area (Vmax):    0.77 cm      PV Vmax:       0.90 m/s AV Area (Vmean):   0.83 cm      PV Vmean:      63.300 cm/s AV Area (VTI):     0.77 cm      PV VTI:        0.171 m AV Vmax:           391.25 cm/s   PV Peak grad:  3.2 mmHg AV Vmean:          244.000 cm/s  PV Mean grad:  2.0 mmHg AV VTI:            0.643 m AV Peak Grad:      61.2 mmHg AV Mean Grad:      26.0 mmHg LVOT Vmax:         160.33 cm/s LVOT Vmean:        106.867 cm/s LVOT VTI:  0.261 m LVOT/AV VTI ratio: 0.41  AORTA Ao Root diam: 3.30 cm Ao Asc diam:  3.00 cm MITRAL VALVE                TRICUSPID VALVE MV Area (PHT): 3.45 cm     TR Peak grad:   40.2 mmHg MV Area VTI:   1.29 cm     TR Vmax:        317.00 cm/s MV Peak grad:  12.4 mmHg MV Mean grad:  7.0 mmHg     SHUNTS MV Vmax:       1.76 m/s     Systemic VTI:  0.26 m MV Vmean:      120.0 cm/s   Systemic Diam: 1.55 cm MV Decel Time: 220 msec MV E velocity: 140.50 cm/s MV A velocity: 172.50 cm/s MV E/A ratio:  0.81 Dorris Carnes MD Electronically signed by Dorris Carnes MD Signature Date/Time: 03/18/2022/2:12:48 PM    Final     Scheduled Meds:  diltiazem  120 mg Oral Daily   feeding supplement  237 mL Oral BID BM   macitentan  10 mg Oral Daily   montelukast  10 mg Oral QHS   pantoprazole  40 mg Oral BID AC   predniSONE  40 mg Oral Q breakfast   sodium chloride flush  3 mL Intravenous Q12H   spironolactone  12.5 mg Oral Daily   Continuous Infusions:  sodium chloride     cefTRIAXone (ROCEPHIN)  IV Stopped (03/19/22 0121)   doxycycline (VIBRAMYCIN) IV Stopped (03/18/22 2319)     LOS: 1 day   Shelly Coss, MD [facility] P5/31/2023, 8:05 AM

## 2022-03-19 NOTE — Progress Notes (Signed)
Initial Nutrition Assessment  DOCUMENTATION CODES:  Severe malnutrition in context of chronic illness  INTERVENTION:  Liberalize diet to regular Adjust ensure enlive to Boost Breeze po TID, each supplement provides 250 kcal and 9 grams of protein MVI with minerals daily  NUTRITION DIAGNOSIS:  Severe Malnutrition related to chronic illness (CHF, hx cancer, ILD) as evidenced by severe fat depletion, severe muscle depletion.  GOAL:  Patient will meet greater than or equal to 90% of their needs  MONITOR:  PO intake, Supplement acceptance  REASON FOR ASSESSMENT:  Consult Assessment of nutrition requirement/status  ASSESSMENT:  Pt with hx of GERD, pulmonary HTN, interstitial lung disease on nocturnal O2, CKD3, CHF, hx breast cancer and renal cell carcinoma presented to ED with chest pain and shortness of breath. Imaging suggestive of pericardial effusion.  Pt resting in bed at the time of assessment. Husband at bedside. Pt reports that she is feeling better this AM.   Discussed recent nutrition intake and weight, pt states that she lost weight PTA but is now back to her usual of 105 lb. Pt reports losing weight several years ago and has maintained at about 105 since. Significant muscle and fat depletions present on exam.  Pt reports that at home, she typically has 2 larger meals and something lighter at lunch. For breakfast, pt will have a muffin with peanut butter or oatmeal, or grits. Lunch may be a snack like fruit or soup and dinner varies. States she may have cereal if she doesn't feel like cooking or she and her husband will split a [employer] meal with a vegetable. Sometimes will cook a meat and a few sides. Pt reports she also splits an ensure with her husband each day.  Pt reports that she drinks lactose free milk at home, states that the ensure hurts her stomach. Stated that at her last admission several years ago she enjoyed boost breeze. Will adjust as these will be better  consumed than the ensure. Will also adjust diet to regular to maximize intake choices in the setting of malnutrition and advanced age.  Nutritionally Relevant Medications: Scheduled Meds:  Ensure Enlive  237 mL Oral BID BM   montelukast  10 mg Oral QHS   pantoprazole  40 mg Oral BID AC   spironolactone  12.5 mg Oral Daily   Continuous Infusions:  sodium chloride     PRN Meds: sucralfate  Labs Reviewed: Sodium 134 BUN 43, creatinine 1.78  NUTRITION - FOCUSED PHYSICAL EXAM: Flowsheet Row Most Recent Value  Orbital Region Moderate depletion  Upper Arm Region Severe depletion  Thoracic and Lumbar Region Severe depletion  Buccal Region Moderate depletion  Temple Region Mild depletion  Clavicle Bone Region Severe depletion  Clavicle and Acromion Bone Region Severe depletion  Scapular Bone Region Severe depletion  Dorsal Hand Severe depletion  Patellar Region Severe depletion  Anterior Thigh Region Severe depletion  Posterior Calf Region Severe depletion  Edema (RD Assessment) None  Hair Reviewed  Eyes Reviewed  Mouth Reviewed  Skin Reviewed  Nails Reviewed   Diet Order:   Diet Order             Diet regular Room service appropriate? Yes; Fluid consistency: Thin  Diet effective now                   EDUCATION NEEDS:  Education needs have been addressed  Skin:  Skin Assessment: Skin Integrity Issues: Skin Integrity Issues:: Stage I Stage I: right buttocks  Last BM:  5/30  Height:  Ht Readings from Last 1 Encounters:  03/18/22 _0  (1.6 m)    Weight:  Wt Readings from Last 1 Encounters:  03/19/22 47.9 kg    Ideal Body Weight:  52.3 kg  BMI:  Body mass index is 18.69 kg/m.  Estimated Nutritional Needs:  Kcal:  1500-1700 kcal/d Protein:  75-90 g/d Fluid:  1.5-1.7 L/d   Ranell Patrick, RD, LDN Clinical Dietitian RD pager # available in [facility]  After hours/weekend pager # available in [facility]

## 2022-03-19 NOTE — Discharge Instructions (Signed)
High-Calorie, High-Protein Nutrition Therapy (2021) A high-calorie, high-protein diet has been recommended to you. Your registered dietitian nutritionist (RDN) may have recommended this diet because you are having difficulty eating enough calories throughout the day, you have lost weight, and/or you need to add protein to your diet. Sometimes you may not feel like eating, even if you know the importance of good nutrition. The recommendations in this handout can help you with the following: Regaining your strength and energy Keeping your body healthy Healing and recovering from surgery or illness and fighting infection Tips: Schedule Your Meals and Snacks Several small meals and snacks are often better tolerated and digested than large meals. Strategies Plan to eat 3 meals and 3 snacks daily. Experiment with timing meals to find out when you have a larger appetite. Appetite may be greatest in the morning after not eating all night so you may prefer to eat your larger meals and snacks in the morning and at lunch. Breakfast-type foods are often better tolerated so eat foods such as eggs, pancakes, waffles and cereal for any meal or snack. Carry snacks with you so you are prepared to eat every 2 to 3 hours. Determine what works best for you if your body's cues for feeling hungry or full are not working. Eat a small meal or snack even if you don't feel hungry. Set a timer to remind you when it is time to eat. Take a walk before you eat (with health care provider's approval). Light or moderate physical activity can help you maintain muscle and increase your appetite. Make Eating Enjoyable Taking steps to make the experience enjoyable may help to increase your interest in eating and improve your appetite. Strategies: Eat with others whenever possible. Include your favorite foods to make meals more enjoyable. Try new foods. Save your beverage for the end of the meal so that you have more room for  food before you get full. Add Calories to Your Meals and Snacks Try adding calorie-dense foods so that each bite provides more nutrition. Strategies Drink milk, chocolate milk, soy milk, or smoothies instead of low-calorie beverages such as diet drinks or water. Cook with milk or soy milk instead of water when making dishes such as hot cereal, cocoa, or pudding. Add jelly, jam, honey, butter or margarine to bread and crackers. Add jam or fruit to ice cream and as a topping over cake. Mix dried fruit, nuts, granola, honey, or dry cereal with yogurt or hot cereals. Enjoy snacks such as milkshakes, smoothies, pudding, ice cream, or custard. Blend a fruit smoothie of a banana, frozen berries, milk or soy milk, and 1 tablespoon nonfat powdered milk or protein powder. Add Protein to Your Meals and Snacks Choose at least one protein food at each meal and snack to increase your daily intake. Strategies Add  cup nonfat dry milk powder or protein powder to make a high-protein milk to drink or to use in recipes that call for milk. Vanilla or peppermint extract or unsweetened cocoa powder could help to boost the flavor. Add hard-cooked eggs, leftover meat, grated cheese, canned beans or tofu to noodles, rice, salads, sandwiches, soups, casseroles, pasta, tuna and other mixed dishes. Add powdered milk or protein powder to hot cereals, meatloaf, casseroles, scrambled eggs, sauces, cream soups, and shakes. Add beans and lentils to salads, soups, casseroles, and vegetable dishes. Eat cottage cheese or yogurt, especially Greek yogurt, with fruit as a snack or dessert. Eat peanut or other nut butters on crackers, bread, toast,  waffles, apples, bananas or celery sticks. Add it to milkshakes, smoothies, or desserts. Consider a ready-made protein shake. Your RDN will make recommendations. Add Fats to Your Meals and Snacks Try adding fats to your meals and snacks. Fat provides more calories in fewer bites than  carbohydrate or protein and adds flavors to your foods. Strategies Snack on nuts and seeds or add them to foods like salads, pasta, cereals, yogurt, and ice cream.  Saut or stir-fry vegetables, meats, chicken, fish or tofu in olive or canola oil.  Add olive oil, other vegetable oils, butter or margarine to soups, vegetables, potatoes, cooked cereal, rice, pasta, bread, crackers, pancakes, or waffles. Snack on olives or add to pasta, pizza, or salad. Add avocado or guacamole to your salads, sandwiches, and other entrees. Include fatty fish such as salmon in your weekly meal plan. For general food safety tips, especially for clients with immunocompromised conditions, ask your RDN for the Food Safety Nutrition Therapy handout. Small Meal and Snack Ideas These snacks and meals are recommended when you have to eat but aren't necessarily hungry.  They are good choices because they are high in protein and high in calories.  2 graham crackers 2 tablespoons peanut or other nut butter 1 cup milk 2 slices whole wheat toast topped with:  avocado, mashed Seasoning of your choice   cup Greek yogurt  cup fruit  cup granola 2 deviled egg halves 5 whole wheat crackers  1 cup cream of tomato soup  grilled cheese sandwich 1 toasted waffle topped with: 2 tablespoons peanut or nut butter 1 tablespoon jam  Trail mix made with:  cup nuts  cup dried fruit  cup cold cereal, any variety  cup oatmeal or cream of wheat cereal 1 tablespoon peanut or nut butter  cup diced fruit   High-Calorie, High-Protein Sample 1-Day Menu View Nutrient Info Breakfast 1 egg, scrambled 1 ounce cheddar cheese 1 English muffin, whole wheat 1 tablespoon margarine 1 tablespoon jam  cup orange juice, fortified with calcium and vitamin D  Morning Snack 1 tablespoon peanut butter 1 banana 1 cup 1% milk  Lunch Tuna salad sandwich made with: 2 slices bread, whole wheat 3 ounces tuna mixed with: 1 tablespoon  mayonnaise  cup pudding  Afternoon Snack  cup hummus  cup carrots 1 pita  Evening Meal Enchilada casserole made with: 2 corn tortillas 3 ounces ground beef, cooked  cup black beans, cooked  cup corn, cooked 1 ounce grated cheddar cheese  cup enchilada sauce  avocado, sliced, topping for enchilada 1 tablespoon sour cream, topping for enchilada Salad:  cup lettuce, shredded  cup tomatoes, chopped, for salad 1 tablespoon olive oil and vinegar dressing, for salad  Evening Snack  cup Greek yogurt  cup blueberries  cup granola   Boost Breeze or Ensure Clear can be Art gallery manager at [employer] or [employer] and they come in packs of 24-27 cartons. Carnation instant breakfast can also be mixed with a glass of whole fat lactose free milk to provide similar nutrition as an ensure or boost shake. It would be beneficial to drink a nutrition supplement such as this 2-3x/d in addition to the meals you are already eating.

## 2022-03-19 NOTE — Progress Notes (Signed)
NAME:  Paula Ward, MRN:  [id number], DOB:  02/22/1936, LOS: 1 ADMISSION DATE:  03/17/2022, CONSULTATION DATE: 03/18/2022 REFERRING MD: Dr. 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   Breast cancer ([place]) 1989   Left   Candida esophagitis (HCC)    Cataract    Chronic kidney disease    Chronic mild renal insuffiency   CREST syndrome (HCC)    GERD (gastroesophageal reflux disease)    w/ HH   Interstitial lung disease (HCC)    Nodule of kidney    Pericardial effusion    PONV (postoperative nausea and vomiting)    Pulmonary embolus ([place]) 2003   Pulmonary hypertension ([place])    followed by Dr Gaynell Face at [facility], now Dr. Maryjean Ka visit end 2'17.   Rectal incontinence    Renal cell carcinoma (HCC)    Scleroderma (HCC)    Tubular adenoma of colon    Uterine polyp      Significant Hospital Events: Including procedures, antibiotic start and stop dates in addition to other pertinent events   Echocardiogram-pending CT scan of the chest IMPRESSION: 1. No evidence for pulmonary embolism. 2. Large pericardial effusion. 3. Mild cardiomegaly. 4. Small bilateral pleural effusions. 5. Bilateral  lower lobe airspace disease compatible with infection. 6. Ground-glass opacities in both lungs favored as edema, although infection is not excluded. Some ground-glass areas are nodular. 7. A follow-up chest CT is recommended in 3 months to re-evaluate and confirm resolution.  Interim History / Subjective:  Cough with sputum production Chest pressure is better No nausea no vomiting No fevers or chills  Objective   Blood pressure (!) 137/56, pulse 88, temperature (!) 97.4 F (36.3 C), temperature source Oral, resp. rate 16, height _0  (1.6 m), weight 47.9 kg, SpO2 92 %.        Intake/Output Summary (Last 24 hours) at 03/19/2022 9480 Last data filed at 03/19/2022 0348 Gross per 24 hour  Intake 944.82 ml  Output 340 ml  Net 604.82 ml   Filed Weights   03/18/22 0825 03/19/22 0344  Weight: 46.4 kg 47.9 kg    Examination: General: Elderly, comfortable, laying in bed HENT: Dry oral mucosa Lungs: Rales at the bases bilaterally Cardiovascular: S1-S2 appreciated Abdomen: Soft, bowel sounds appreciated Extremities: No clubbing, no edema Neuro: Alert and oriented x3 GU: [facility] Problem list     Assessment & Plan:  Pulmonary arterial hypertension -On macitentan -Continue macitentan 10 mg daily -No plans to change medication or dosage  History of crest syndrome Scleroderma Interstitial lung disease -On oxygen supplementation at night -Recently completed pulmonary rehab -Was able to walk about 20 minutes prior to coming into the hospital -No significant limitations prior  to recent illness  Chest pressure -This is gotten significantly better since being in the hospital  Possible pneumonia -Likely community-acquired pneumonia -We will recommend to complete full course of antibiotics -I did add prednisone -Patient requesting a lower dose of prednisone as she is not able to sleep with the high-dose  Decompensated heart failure Diastolic heart  failure -Most recent ejection fraction of 60% -Bilateral small pleural effusions -On diuretics  Large pericardial effusion -At been present since 2017 -Repeat echocardiogram pending  Elevated troponin -Unlikely related to any acute coronary event  History of GI bleeding due to AVMs -Continue PPIs  Aortic stenosis Mitral valve disease with calcified annulus -Echocardiogram pending  She does have an IVC in place for history of pulmonary embolism -Recent CTA negative for pulmonary embolus  History of renal cell cancer  May be transition to oral antibiotics -Omnicef and Doxy to complete 7 days -Decreased prednisone to 20 mg daily  Patient scheduled for coronary CT angio  Best Practice (right click and "Reselect all SmartList Selections" daily)   Diet/type: Regular consistency (see orders) DVT prophylaxis: SCD GI prophylaxis: PPI Lines: N/A Foley:  N/A Code Status:  full code Last date of multidisciplinary goals of care discussion [pending]  Labs   CBC: Recent Labs  Lab 03/12/22 1247 03/14/22 1048 03/17/22 1621 03/18/22 0147  WBC 7.6  --  10.9* 9.7  NEUTROABS 6.1  --  9.7*  --   HGB 11.9* 11.7* 11.0* 11.5*  HCT 36.0  --  32.8* 35.8*  MCV 108.5*  --  107.9* 110.8*  PLT 208.0  --  215 223    Basic Metabolic Panel: Recent Labs  Lab 03/12/22 1247 03/17/22 1621 03/18/22 0147  NA 137 133* 132*  K 3.8 4.0 3.9  CL 98 98 99  CO2 _0 GLUCOSE 101* 165* 107*  BUN 36* 33* 31*  CREATININE 1.26* 1.24* 1.20*  CALCIUM 9.7 9.0 9.0  MG  --   --  1.9  PHOS  --   --  3.3   GFR: Estimated Creatinine Clearance: 26.9 mL/min (A) (by C-G formula based on SCr of 1.2 mg/dL (H)). Recent Labs  Lab 03/12/22 1247 03/17/22 1621 03/18/22 0147  PROCALCITON  --   --  0.52  WBC 7.6 10.9* 9.7    Liver Function Tests: Recent Labs  Lab 03/12/22 1247 03/17/22 1621 03/18/22 0147 03/18/22 1853  AST _1 ALT _2 ALKPHOS 58 54 54  54 54   BILITOT 0.6 1.0 0.6  0.8 0.3  PROT 7.1 6.9 6.7  6.7 6.7  ALBUMIN 4.3 3.6 3.4*  3.4* 3.3*   No results for input(s): LIPASE, AMYLASE in the last 168 hours. No results for input(s): AMMONIA in the last 168 hours.  ABG    Component Value Date/Time   HCO3 26.1 03/29/2021 0534   TCO2 23 06/15/2012 1158   O2SAT 97.1 03/29/2021 0534     Coagulation Profile: No results for input(s): INR, PROTIME in the last 168 hours.  Cardiac Enzymes: Recent Labs  Lab 03/18/22 0147  CKTOTAL 56    HbA1C: Hgb A1c MFr Bld  Date/Time Value Ref Range Status  01/06/2020 10:42 AM 5.2 4.6 - 6.5 % Final    Comment:    Glycemic Control Guidelines for People with Diabetes:Non Diabetic:  <6%Goal of Therapy: <7%Additional Action Suggested:  >8%     CBG: No results for input(s): GLUCAP in the last 168 hours.  Review of Systems:   Insomnia chest Chest pressure is resolved  Past Medical History:  She,  has a past medical history of Anemia, Angiodysplasia of stomach and duodenum, Arthus phenomenon, AVM (arteriovenous malformation), Blood transfusion without reported diagnosis, Breast cancer ([place]) (1989), Candida esophagitis ([place]), Cataract, Chronic kidney disease, CREST syndrome ([place]), GERD (gastroesophageal reflux disease), Interstitial lung disease ([place]), Nodule of kidney, Pericardial effusion, PONV (postoperative nausea and vomiting), Pulmonary embolus ([place]) (2003), Pulmonary hypertension ([place]), Rectal incontinence, Renal cell carcinoma ([place]), Scleroderma ([place]), Tubular adenoma of colon, and Uterine polyp.   Surgical History:   Past Surgical History:  Procedure Laterality Date   APPENDECTOMY  1962   BREAST LUMPECTOMY  1989   left   CATARACT EXTRACTION, BILATERAL Bilateral 12/2013   COLONOSCOPY WITH PROPOFOL N/A 04/15/2021   Procedure: COLONOSCOPY WITH PROPOFOL;  Surgeon: Milus Banister, MD;  Location: [facility];  Service: Endoscopy;  Laterality: N/A;   ENTEROSCOPY N/A 01/18/2016   Procedure:  ENTEROSCOPY;  Surgeon: Mauri Pole, MD;  Location: [facility];  Service: Endoscopy;  Laterality: N/A;   ENTEROSCOPY N/A 05/24/2018   Procedure: ENTEROSCOPY;  Surgeon: Jackquline Denmark, MD;  Location: [facility];  Service: Endoscopy;  Laterality: N/A;   ENTEROSCOPY N/A 03/29/2021   Procedure: ENTEROSCOPY;  Surgeon: Jerene Bears, MD;  Location: [facility];  Service: Gastroenterology;  Laterality: N/A;   ENTEROSCOPY N/A 04/13/2021   Procedure: ENTEROSCOPY;  Surgeon: Yetta Flock, MD;  Location: [facility];  Service: Gastroenterology;  Laterality: N/A;   ESOPHAGOGASTRODUODENOSCOPY (EGD) WITH PROPOFOL N/A 12/21/2015   Procedure: ESOPHAGOGASTRODUODENOSCOPY (EGD) WITH PROPOFOL;  Surgeon: Irene Shipper, MD;  Location: [facility];  Service: Endoscopy;  Laterality: N/A;   HOT HEMOSTASIS N/A 05/24/2018   Procedure: HOT HEMOSTASIS (ARGON PLASMA COAGULATION/BICAP);  Surgeon: Jackquline Denmark, MD;  [facility];  Service: Endoscopy;  Laterality: N/A;   HOT HEMOSTASIS N/A 03/29/2021   Procedure: HOT HEMOSTASIS (ARGON PLASMA COAGULATION/BICAP);  Surgeon: Jerene Bears, MD;  Location: [facility];  Service: Gastroenterology;  Laterality: N/A;   HOT HEMOSTASIS N/A 04/13/2021   Procedure: HOT HEMOSTASIS (ARGON PLASMA COAGULATION/BICAP);  Surgeon: Yetta Flock, MD;  Location: [facility];  Service: Gastroenterology;  Laterality: N/A;   IR GENERIC HISTORICAL  06/05/2016   IR RADIOLOGIST EVAL & MGMT 06/05/2016 Aletta Edouard, MD [facility] INTERV RAD   IVC Filter     KIDNEY SURGERY     left -"laser surgery by Dr. Kathlene Cote- 5 yrs ago" no removal   SCHLEROTHERAPY  05/24/2018   Procedure: SCHLEROTHERAPY;  Surgeon: Jackquline Denmark, MD;  Location: [facility];  Service: Endoscopy;;   SUBMUCOSAL TATTOO INJECTION  04/13/2021   Procedure: SUBMUCOSAL TATTOO INJECTION;  Surgeon: Yetta Flock, MD;  Location: [facility];  Service: Gastroenterology;;   TONSILLECTOMY     TUBAL LIGATION        Social History:   reports that she has never smoked. She has never used smokeless tobacco. She reports that she does not drink alcohol and does not use drugs.   Family History:  Her family history includes Alzheimer's disease in her mother; Bladder Cancer in her father; Diabetes in her father and sister; Esophageal cancer in her paternal uncle; Lung cancer in her sister; Prostate cancer in her father. There is no history of Colon cancer.   Allergies Allergies  Allergen Reactions   Codeine Nausea Only    Hallucinations, too   Other Nausea And Vomiting    "-mycin" antibiotics.   Also cause hallucinations.   Erythromycin Nausea  And Vomiting   Lisinopril     Pt doesn't remember    Mycophenolate Mofetil Nausea Only   Sulfa Antibiotics Other (See Comments)    unknown     Home Medications  Prior to Admission medications   Medication Sig Start Date End Date Taking? Authorizing Provider  acetaminophen (TYLENOL) 325 MG tablet Take 650 mg by mouth every 6 (six) hours as needed for mild pain.    Yes [provider]  albuterol (VENTOLIN HFA) 108 (90 Base) MCG/ACT inhaler INHALE 2 PUFFS INTO THE LUNGS EVERY 6 HOURS AS NEEDED FOR WHEEZING OR SHORTNESS OF BREATH Patient taking differently: Inhale 2 puffs into the lungs every 6 (six) hours as needed for wheezing or shortness of breath. 03/06/20  Yes Hoyt Koch, MD  ALPRAZolam Duanne Moron) 0.25 MG tablet Take 0.5 tablets (0.125 mg total) by mouth at bedtime as needed. Patient taking differently: Take 0.125 mg by mouth at bedtime as needed for anxiety or sleep. 10/29/20  Yes Hoyt Koch, MD  chlorhexidine (PERIDEX) 0.12 % solution 15 mLs by Mouth Rinse route 2 (two) times daily as needed (sore mouth). 12/23/21  Yes [provider]  Cyanocobalamin (VITAMIN B-12 PO) Take 1 tablet by mouth daily. Unsure of dose   Yes [provider]  diltiazem (CARDIZEM CD) 120 MG 24 hr capsule Take 120 mg by mouth daily.   Yes  [provider]  fluticasone (FLONASE) 50 MCG/ACT nasal spray INSTILL 1 SPRAY INTO EACH NOSTRIL TWICE A DAY IF NEEDED Patient taking differently: Place 1 spray into both nostrils daily as needed for allergies. 09/18/20  Yes Hoyt Koch, MD  furosemide (LASIX) 20 MG tablet Take 1 tablet by mouth once daily Patient taking differently: Take 20 mg by mouth daily. 12/12/21  Yes Hoyt Koch, MD  guaiFENesin (MUCINEX) 600 MG 12 hr tablet Take 150 mg by mouth daily.   Yes [provider]  methylPREDNISolone (MEDROL) 4 MG tablet Take 2 mg by mouth daily. 01/22/22  Yes [provider]  montelukast (SINGULAIR) 10 MG tablet TAKE 1 TABLET BY MOUTH AT BEDTIME Patient taking differently: Take 10 mg by mouth at bedtime as needed (allergies). 03/03/22  Yes Hoyt Koch, MD  Mouthwashes (DRY MOUTH MOUTHWASH) LIQD Use as directed 5 mLs in the mouth or throat daily as needed (dry mouth). Biotene Patient taking differently: Use as directed 5 mLs in the mouth or throat at bedtime. Biotene 04/01/21  Yes Shelly Coss, MD  Multiple Vitamins-Iron (MULTIVITAMIN/IRON PO) Take 1 tablet by mouth daily.   Yes [provider]  Multiple Vitamins-Minerals (ZINC PO) Take 1 tablet by mouth daily. Unsure of dose   Yes [provider]  OPSUMIT 10 MG TABS Take 10 mg by mouth daily. 08/16/15  Yes [provider]  OXYGEN Inhale into the lungs. 2 LMP at night and upon exertion   Yes [provider]  pantoprazole (PROTONIX) 40 MG tablet Take 1 tablet (40 mg total) by mouth 2 (two) times daily before a meal. 02/27/22  Yes Nandigam, Venia Minks, MD  Pyridoxine HCl (VITAMIN B6 PO) Take 1 tablet by mouth daily. Unsure of dose   Yes [provider]  spironolactone (ALDACTONE) 25 MG tablet Take 12.5 mg by mouth daily. 06/09/20  Yes [provider]  sucralfate (CARAFATE) 1 GM/10ML suspension Take 1 g by mouth every 6 (six) hours as needed  (reflux). 03/01/22  Yes [provider]  sucralfate (CARAFATE) 1 g tablet Take 1 tablet (1  g total) by mouth every 6 (six) hours as needed. Patient not taking: Reported on 03/17/2022 02/07/22   Mauri Pole, MD    Sherrilyn Rist, MD [place] Pager: See Shea Evans

## 2022-03-19 NOTE — Progress Notes (Addendum)
Progress Note  Patient Name: Paula Ward Date of Encounter: 03/19/2022  [facility] Cardiologist: Kirk Ruths, MD   Subjective   Patient states she no longer has any chest pain or pressure. She denied SOB. She is urinating without problems. She states she had kidney problems in the past after receiving contrast.   Inpatient Medications    Scheduled Meds:  diltiazem  120 mg Oral Daily   feeding supplement  237 mL Oral BID BM   macitentan  10 mg Oral Daily   montelukast  10 mg Oral QHS   pantoprazole  40 mg Oral BID AC   [START ON 03/20/2022] predniSONE  20 mg Oral Q breakfast   sodium chloride flush  3 mL Intravenous Q12H   Continuous Infusions:  sodium chloride     cefTRIAXone (ROCEPHIN)  IV Stopped (03/19/22 0121)   doxycycline (VIBRAMYCIN) IV Stopped (03/18/22 2319)   PRN Meds: sodium chloride, acetaminophen **OR** acetaminophen, albuterol, ALPRAZolam, HYDROcodone-acetaminophen, sodium chloride flush, sucralfate   Vital Signs    Vitals:   03/18/22 2011 03/19/22 0015 03/19/22 0344 03/19/22 0831  BP: (!) 119/55 (!) 114/54 (!) 137/56 (!) 131/56  Pulse: 94 92 88 88  Resp: _0 Temp: 98.9 F (37.2 C) 97.8 F (36.6 C) (!) 97.4 F (36.3 C)   TempSrc: Oral Oral Oral   SpO2: 93% 90% 92%   Weight:   47.9 kg   Height:        Intake/Output Summary (Last 24 hours) at 03/19/2022 1023 Last data filed at 03/19/2022 0348 Gross per 24 hour  Intake 944.82 ml  Output 340 ml  Net 604.82 ml      03/19/2022    3:44 AM 03/18/2022    8:25 AM 03/04/2022   12:11 PM  Last 3 Weights  Weight (lbs) 105 lb 8 oz 102 lb 4.8 oz 104 lb 15 oz  Weight (kg) 47.854 kg 46.403 kg 47.6 kg      Telemetry    SR - Personally Reviewed  ECG    No new tracing today - Personally Reviewed  Physical Exam   GEN: No acute distress.   Neck: No JVD Cardiac: RRR, systolic murmur grade III Respiratory: Clear to auscultation bilaterally.  GI: Soft, nontender MS:No leg edema  Neuro:   Nonfocal  Psych: Normal affect   Labs    High Sensitivity Troponin:   Recent Labs  Lab 03/17/22 1623 03/17/22 1811  TROPONINIHS 30* 26*     Chemistry Recent Labs  Lab 03/17/22 1621 03/18/22 0147 03/18/22 1853 03/19/22 0739  NA 133* 132*  --  134*  K 4.0 3.9  --  4.5  CL 98 99  --  100  CO2 24 23  --  23  GLUCOSE 165* 107*  --  115*  BUN 33* 31*  --  43*  CREATININE 1.24* 1.20*  --  1.78*  CALCIUM 9.0 9.0  --  9.1  MG  --  1.9  --   --   PROT 6.9 6.7  6.7 6.7  --   ALBUMIN 3.6 3.4*  3.4* 3.3*  --   AST _1 --   ALT _2 --   ALKPHOS 54 54  54 54  --   BILITOT 1.0 0.6  0.8 0.3  --   GFRNONAA 43* 45*  --  28*  ANIONGAP 11 10  --  11    Lipids No results for  input(s): CHOL, TRIG, HDL, LABVLDL, LDLCALC, CHOLHDL in the last 168 hours.  Hematology Recent Labs  Lab 03/12/22 1247 03/14/22 1048 03/17/22 1621 03/18/22 0147  WBC 7.6  --  10.9* 9.7  RBC 3.32*  --  3.04* 3.23*  HGB 11.9* 11.7* 11.0* 11.5*  HCT 36.0  --  32.8* 35.8*  MCV 108.5*  --  107.9* 110.8*  MCH  --   --  36.2* 35.6*  MCHC 33.0  --  33.5 32.1  RDW 14.2  --  13.5 13.5  PLT 208.0  --  215 244   Thyroid No results for input(s): TSH, FREET4 in the last 168 hours.  BNP Recent Labs  Lab 03/17/22 1622  BNP 2,025.3*    DDimer No results for input(s): DDIMER in the last 168 hours.   Radiology    CT Angio Chest Pulmonary Embolism (PE) W or WO Contrast  Result Date: 03/18/2022 CLINICAL DATA:  Chest pain EXAM: CT ANGIOGRAPHY CHEST WITH CONTRAST TECHNIQUE: Multidetector CT imaging of the chest was performed using the standard protocol during bolus administration of intravenous contrast. Multiplanar CT image reconstructions and MIPs were obtained to evaluate the vascular anatomy. RADIATION DOSE REDUCTION: This exam was performed according to the departmental dose-optimization program which includes automated exposure control, adjustment of the mA and/or kV according to patient  size and/or use of iterative reconstruction technique. CONTRAST:  43m OMNIPAQUE IOHEXOL 350 MG/ML SOLN COMPARISON:  CT chest 03/30/2021 FINDINGS: Cardiovascular: The heart is mildly enlarged. There is a large pericardial effusion which is low-density. Aorta is normal in size. There are atherosclerotic calcifications of the aorta. There is adequate opacification of the pulmonary arteries to the segmental level. There is no evidence for pulmonary embolism. Mediastinum/Nodes: No enlarged mediastinal, hilar, or axillary lymph nodes. Thyroid gland, trachea, and esophagus demonstrate no significant findings. Lungs/Pleura: There are small bilateral pleural effusions, left greater than right. There is airspace consolidation of the inferior left lower lobe and superior segment of the right lower lobe. There are patchy and confluent ground-glass opacities bilaterally. There also ill-defined ground-glass nodular densities measuring up to 8 mm in the upper lungs. Peripheral reticular opacities in peripheral patchy airspace opacities persist and have mildly increased in the left upper lobe. Trachea and central airways appear patent. There is no evidence for pneumothorax. Upper Abdomen: No acute abnormality. Musculoskeletal: No chest wall abnormality. No acute or significant osseous findings. Review of the MIP images confirms the above findings. IMPRESSION: 1. No evidence for pulmonary embolism. 2. Large pericardial effusion. 3. Mild cardiomegaly. 4. Small bilateral pleural effusions. 5. Bilateral lower lobe airspace disease compatible with infection. 6. Ground-glass opacities in both lungs favored as edema, although infection is not excluded. Some ground-glass areas are nodular. 7. A follow-up chest CT is recommended in 3 months to re-evaluate and confirm resolution. Electronically Signed   By: ARonney AstersM.D.   On: 03/18/2022 00:03   DG Chest Port 1 View  Result Date: 03/17/2022 CLINICAL DATA:  Shortness of breath.   Weakness. EXAM: PORTABLE CHEST 1 VIEW COMPARISON:  03/30/2021 FINDINGS: Marked cardiac enlargement is stable. Diffuse pulmonary interstitial prominence also stable. Infiltrate or atelectasis in left lower lobe also shows no significant change. IMPRESSION: Left lobe atelectasis versus infiltrate, without significant change. Stable cardiomegaly and diffuse pulmonary interstitial prominence. Electronically Signed   By: JMarlaine HindM.D.   On: 03/17/2022 17:06   ECHOCARDIOGRAM COMPLETE  Result Date: 03/18/2022    ECHOCARDIOGRAM REPORT   Patient Name:   SKENNETTA PAVLOVIC  Date of Exam: 03/18/2022 Medical Rec #:  [id number]       Height:       63.0 in Accession #:    [id number]      Weight:       102.3 lb Date of Birth:  1935-10-24       BSA:          1.454 m Patient Age:    49 years        BP:           126/59 mmHg Patient Gender: F               HR:           90 bpm. Exam Location:  Inpatient Procedure: 2D Echo, Cardiac Doppler, Color Doppler and Strain Analysis Indications:    Pericardial effusion  History:        Patient has prior history of Echocardiogram examinations, most                 recent 09/08/2018. Pericardial Disease; Pulmonary HTN. AICD                 04/15/21.  Sonographer:    Luisa Hart RDCS Referring Phys: [place]  1. Left ventricular ejection fraction, by estimation, is 55 to 60%. The left ventricle has normal function. The left ventricle has no regional wall motion abnormalities. There is moderate left ventricular hypertrophy. Left ventricular diastolic parameters are consistent with Grade I diastolic dysfunction (impaired relaxation). Elevated left atrial pressure.  2. Right ventricular systolic function is normal. The right ventricular size is normal.  3. Left atrial size was severely dilated.  4. Large pericardial effusion surrounds heart Most prominent along lateral side of LV 24 mm in maximal dimension. There is no evidence by echo criteria for hemodynamic  compromise. Mitral inflow shows no signficant respiratory variation, there is no RV collapse, IVC is normal size. . Large pleural effusion.  5. Signficant mitral annular calcification. Mitral leaflets are thickened with moderately restricted motion. Peak and mean gradients through the valve are 12 and 7 mm Hg respectively. (HR =91 bpm). The mitral valve is abnormal. Trivial mitral valve regurgitation. No evidence of mitral stenosis. Moderate to severe mitral annular calcification.  6. AV is thickened, calcified with restricted motion. Peak and mean gradients through the valve are 45 and 25 mm Hg respectively Dimensionless index is 0.26. Overall consistent with moderate AS. Compared to echo report from Jan 2023 [facility]), no significant change. . The aortic valve is tricuspid. Aortic valve regurgitation is not visualized. FINDINGS  Left Ventricle: Left ventricular ejection fraction, by estimation, is 55 to 60%. The left ventricle has normal function. The left ventricle has no regional wall motion abnormalities. The left ventricular internal cavity size was small. There is moderate  left ventricular hypertrophy. Left ventricular diastolic parameters are consistent with Grade I diastolic dysfunction (impaired relaxation). Elevated left atrial pressure. Right Ventricle: The right ventricular size is normal. Right vetricular wall thickness was not assessed. Right ventricular systolic function is normal. Left Atrium: Left atrial size was severely dilated. Right Atrium: Right atrial size was normal in size. Pericardium: Large pericardial effusion surrounds heart Most prominent along lateral side of LV 24 mm in maximal dimension. There is no evidence by echo criteria for hemodynamic compromise. Mitral inflow shows no signficant respiratory variation, there is no RV collapse, IVC is normal size. Mitral Valve: Signficant mitral annular calcification. Mitral leaflets are thickened with moderately restricted motion.  Peak and mean  gradients through the valve are 12 and 7 mm Hg respectively. (HR =91 bpm). The mitral valve is abnormal. There is moderate thickening of the mitral valve leaflet(s). There is mild calcification of the mitral valve leaflet(s). Moderate to severe mitral annular calcification. Trivial mitral valve regurgitation. No evidence of mitral valve stenosis. MV peak gradient, 12.4 mmHg. The mean mitral valve gradient is 7.0 mmHg. Tricuspid Valve: The tricuspid valve is normal in structure. Tricuspid valve regurgitation is mild. Aortic Valve: AV is thickened, calcified with restricted motion. Peak and mean gradients through the valve are 45 and 25 mm Hg respectively Dimensionless index is 0.26. Overall consistent with moderate AS. Compared to echo report from Jan 2023 [facility]), no  significant change. The aortic valve is tricuspid. Aortic valve regurgitation is not visualized. Aortic valve mean gradient measures 26.0 mmHg. Aortic valve peak gradient measures 61.2 mmHg. Aortic valve area, by VTI measures 0.77 cm. Pulmonic Valve: The pulmonic valve was grossly normal. Pulmonic valve regurgitation is not visualized. Aorta: The aortic root and ascending aorta are structurally normal, with no evidence of dilitation. IAS/Shunts: No atrial level shunt detected by color flow Doppler. Additional Comments: There is a large pleural effusion.  LEFT VENTRICLE PLAX 2D LVIDd:         3.30 cm     Diastology LVIDs:         2.00 cm     LV e' medial:    3.68 cm/s LV PW:         1.50 cm     LV E/e' medial:  38.2 LV IVS:        1.60 cm     LV e' lateral:   3.59 cm/s LVOT diam:     1.55 cm     LV E/e' lateral: 39.1 LV SV:         49 LV SV Index:   34 LVOT Area:     1.89 cm  LV Volumes (MOD) LV vol d, MOD A2C: 56.2 ml LV vol d, MOD A4C: 62.1 ml LV vol s, MOD A2C: 23.6 ml LV vol s, MOD A4C: 27.0 ml LV SV MOD A2C:     32.6 ml LV SV MOD A4C:     62.1 ml LV SV MOD BP:      35.7 ml RIGHT VENTRICLE RV Basal diam:  3.30 cm RV Mid diam:    1.70 cm RV S prime:      14.30 cm/s TAPSE (M-mode): 1.2 cm LEFT ATRIUM              Index        RIGHT ATRIUM           Index LA Vol (A2C):   126.0 ml 86.64 ml/m  RA Area:     13.30 cm LA Vol (A4C):   86.6 ml  59.55 ml/m  RA Volume:   26.80 ml  18.43 ml/m LA Biplane Vol: 109.0 ml 74.95 ml/m  AORTIC VALVE                     PULMONIC VALVE AV Area (Vmax):    0.77 cm      PV Vmax:       0.90 m/s AV Area (Vmean):   0.83 cm      PV Vmean:      63.300 cm/s AV Area (VTI):     0.77 cm      PV VTI:  0.171 m AV Vmax:           391.25 cm/s   PV Peak grad:  3.2 mmHg AV Vmean:          244.000 cm/s  PV Mean grad:  2.0 mmHg AV VTI:            0.643 m AV Peak Grad:      61.2 mmHg AV Mean Grad:      26.0 mmHg LVOT Vmax:         160.33 cm/s LVOT Vmean:        106.867 cm/s LVOT VTI:          0.261 m LVOT/AV VTI ratio: 0.41  AORTA Ao Root diam: 3.30 cm Ao Asc diam:  3.00 cm MITRAL VALVE                TRICUSPID VALVE MV Area (PHT): 3.45 cm     TR Peak grad:   40.2 mmHg MV Area VTI:   1.29 cm     TR Vmax:        317.00 cm/s MV Peak grad:  12.4 mmHg MV Mean grad:  7.0 mmHg     SHUNTS MV Vmax:       1.76 m/s     Systemic VTI:  0.26 m MV Vmean:      120.0 cm/s   Systemic Diam: 1.55 cm MV Decel Time: 220 msec MV E velocity: 140.50 cm/s MV A velocity: 172.50 cm/s MV E/A ratio:  0.81 Dorris Carnes MD Electronically signed by Dorris Carnes MD Signature Date/Time: 03/18/2022/2:12:48 PM    Final     Cardiac Studies   Echo from 03/18/2022:   1. Left ventricular ejection fraction, by estimation, is 55 to 60%. The  left ventricle has normal function. The left ventricle has no regional  wall motion abnormalities. There is moderate left ventricular hypertrophy.  Left ventricular diastolic  parameters are consistent with Grade I diastolic dysfunction (impaired  relaxation). Elevated left atrial pressure.   2. Right ventricular systolic function is normal. The right ventricular  size is normal.   3. Left atrial size was severely dilated.   4. Large  pericardial effusion surrounds heart Most prominent along  lateral side of LV 24 mm in maximal dimension. There is no evidence by  echo criteria for hemodynamic compromise. Mitral inflow shows no  signficant respiratory variation, there is no RV  collapse, IVC is normal size. . Large pleural effusion.   5. Signficant mitral annular calcification. Mitral leaflets are thickened  with moderately restricted motion. Peak and mean gradients through the  valve are 12 and 7 mm Hg respectively. (HR =91 bpm). The mitral valve is  abnormal. Trivial mitral valve  regurgitation. No evidence of mitral stenosis. Moderate to severe mitral  annular calcification.   6. AV is thickened, calcified with restricted motion. Peak and mean  gradients through the valve are 45 and 25 mm Hg respectively Dimensionless  index is 0.26. Overall consistent with moderate AS. Compared to echo  report from Jan 2023 [facility]), no  significant change. . The aortic valve is tricuspid. Aortic valve  regurgitation is not visualized.    Patient Profile     86 y.o. female with PMH of pulmonary arterial hypertension secondary to limited scleroderma with possible contribution of ILD (group 3), chronic pericardial effusion 2003, moderate aortic stenosis, PE, GI bleed 2/2 AVMs s/p IVC, CREST syndrome, chronic anemia, breast CA, renal cell CA, who is being seen 03/17/2022 for  the evaluation of large pericardial effusion and elevated troponin.   Assessment & Plan    Chest pain - Presented with chest pressure and shortness of breath, not related to activity - Hs trop 30 >26  - EKG without acute ischemic change - 2003 cardiac catheterization revealed normal coronary arteries - Echo from 03/18/2022 with LVEF 55 to 60%,no RWMA, grade I DD -None coronary CT showed calcification in the distribution of right and left main coronary artery as well as proximal to mid LAD -Repeated BMP today with new onset AKI, will hold off obtaining CT coronary  study today given concern of CIN, may consider eventual left and right heart cath when AKI recovers   Large pericardial effusion -Patient has chronic pericardial effusion dating back to 2003 -Echo from 03/18/2022 revealed large circumferential pericardial effusion -No clinical or echocardiogram evidence of cardiac tamponade  Moderate aortic stenosis Moderate mitral stenosis  -Echo from 03/18/2022 with LVEF 55 to 60%,no RWMA, grade I DD, large pericardial effusion, trivial MR, moderate to severe mitral annular calcification, moderate aortic stenosis with peak gradient 45 mmHg, mean gradient 25 mmHg, dimensionless index 0.26;  Mitral leaflets are thickened with moderately restricted motion. Peak and mean gradients through the valve are 12 and 7 mm Hg respectively. -Follow-up outpatient with structural heart team   AKI, new today - trend BMP, consider gentle IVF, monitor UOP  Chronic diastolic heart failure  - last heart cath 11/22/21 at [facility]: RA 7, RV 50/8, PA 50/21 mean 32, wedge 21 ; Cardiac output is 6 and index of 4.1 L/min/m2.  PVR is 1.8 WU  and SVR of 10.7 WU with NIBP 141/54 mean 71  - No indication for further pulmonary vasodilator per cath report - clinically euvolemic now - Diuresis as needed, on lasix 5m daily with spironolactone 12.537mdaily, hold both for AKI now   Presumed pneumonia  Pulmonary arterial hypertension Scleroderma Pulmonary fibrosis /Interstitial lung disease  Chronic hypoxic respiratory failure  Hx of CREST Syndrome  -Diuresis as needed -Follows [facility], pulmonology  is following, on antibiotic and macitentan and diltiazem currently   Chronic anemia Hx of GI bleed Hx of PE s/p IVC filter  Hx of renal cell cancer Hx of  breast cancer -Managed per medicine     For questions or updates, please contact [facility] Please consult [email or web address] for contact info under        Signed, XiMargie BilletNP  03/19/2022, 10:23 AM     I have seen and examined the  patient along with XiMargie BilletNP .  I have reviewed the chart, notes and new data.  I agree with PA/NP's note.  Key new complaints: Denies dyspnea.  No longer has any chest discomfort. Key examination changes: No clinical signs of elevated cardiac filling pressures, normal JVD, no edema, clear lungs Key new findings / data: Creatinine increased to 1.78  PLAN: Unfortunately, it appears that she has developed some degree of contrast related nephrotoxicity following CT angiography.  It is not safe to perform coronary CT angiogram at this time.  Or canceled. In fact, it is highly likely that in the next roughly 2 years she will require right or left heart catheterization as a prologue to TAVR procedure for what is now moderate aortic stenosis, approaching severe aortic stenosis.  I think it may be best to allow her renal function to return to baseline and then scheduled for outpatient right and left heart catheterization.  MiSanda KleinMD, [id number] [phone]/[id number]/2023,  12:20 PM

## 2022-03-19 NOTE — TOC Progression Note (Signed)
Transition of Care [facility]) - Progression Note    Patient Details  Name: Paula Ward MRN: [id number] Date of Birth: 1936/09/09  Transition of Care [facility]) CM/SW Contact  Graves-Bigelow, Ocie Cornfield, RN Phone Number: 03/19/2022, 12:59 PM  Clinical Narrative:  Case Manager spoke with patient and she is agreeable to Outpatient Physical Therapy. Ambulatory Referral submitted to Outpatient Rehab at [facility]. Office to call the patient with a visit time. No further needs identified at this time.   Expected Discharge Plan: Home/Self Care Barriers to Discharge: Continued Medical Work up  Expected Discharge Plan and Services Expected Discharge Plan: Home/Self Care In-house Referral: NA Discharge Planning Services: CM Consult   Living arrangements for the past 2 months: Single Family Home                   DME Agency: NA  Readmission Risk Interventions    03/18/2022    3:45 PM  Readmission Risk Prevention Plan  Transportation Screening Complete  PCP or Specialist Appt within 3-5 Days Complete  HRI or [place] Complete  Social Work Consult for [place] Planning/Counseling Complete  Palliative Care Screening Not Applicable  Medication Review [employer] photographer) Complete

## 2022-03-20 DIAGNOSIS — I5033 Acute on chronic diastolic (congestive) heart failure: Secondary | ICD-10-CM | POA: Diagnosis not present

## 2022-03-20 DIAGNOSIS — N1832 Chronic kidney disease, stage 3b: Secondary | ICD-10-CM | POA: Diagnosis not present

## 2022-03-20 DIAGNOSIS — I3139 Other pericardial effusion (noninflammatory): Secondary | ICD-10-CM | POA: Diagnosis not present

## 2022-03-20 DIAGNOSIS — E43 Unspecified severe protein-calorie malnutrition: Secondary | ICD-10-CM | POA: Insufficient documentation

## 2022-03-20 DIAGNOSIS — I272 Pulmonary hypertension, unspecified: Secondary | ICD-10-CM | POA: Diagnosis not present

## 2022-03-20 LAB — BASIC METABOLIC PANEL
Anion gap: 9 (ref 5–15)
BUN: 55 mg/dL — ABNORMAL HIGH (ref 8–23)
CO2: 23 mmol/L (ref 22–32)
Calcium: 8.9 mg/dL (ref 8.9–10.3)
Chloride: 103 mmol/L (ref 98–111)
Creatinine, Ser: 2 mg/dL — ABNORMAL HIGH (ref 0.44–1.00)
GFR, Estimated: 24 mL/min — ABNORMAL LOW (ref >60)
Glucose, Bld: 132 mg/dL — ABNORMAL HIGH (ref 70–99)
Potassium: 4.6 mmol/L (ref 3.5–5.1)
Sodium: 135 mmol/L (ref 135–145)

## 2022-03-20 MED ORDER — MAGIC MOUTHWASH
10.0000 mL | Freq: Four times a day (QID) | ORAL | Status: DC
  Administered 2022-03-20 – 2022-03-22 (×8): 10 mL via ORAL
  Filled 2022-03-20 (×12): qty 10

## 2022-03-20 MED ORDER — SODIUM CHLORIDE 0.9 % IV BOLUS: 250.0000 mL | Freq: Once | INTRAVENOUS | Status: DC

## 2022-03-20 MED ORDER — SODIUM CHLORIDE 0.9 % IV SOLN: INTRAVENOUS | Status: DC

## 2022-03-20 NOTE — Progress Notes (Signed)
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  In the emergency department CT angiogram was done which showed large pericardial effusion along with possible pneumonia.  Admitted for further management.  Cardiology, Pulmonology consulted.  Currently she denies chest pain and her dyspnea at baseline.  Hospital course remarkable for new onset AKI, on IV fluids  Assessment & Plan:  Principal Problem:   Pericardial effusion Active Problems:   Chest pain, atypical   Acute on chronic diastolic CHF (congestive heart failure) (HCC)   Anemia, iron deficiency   Pulmonary hypertension (HCC)   Essential hypertension   CKD (chronic kidney disease) stage 3, GFR 30-59 ml/min (HCC)   ILD (interstitial lung disease) (HCC)   Elevated troponin   CAP (community acquired pneumonia)   Pressure injury of skin   AKI (acute kidney injury) ([place])   Acute on chronic hypoxic respiratory failure: Secondary to multifactorial etiology: Pneumonia, ILD, pericardial effusion, chronic diastolic congestive heart failure.  She is oxygen at home at nighttime.  Currently requiring 2 L of oxygen per minute.  We will try to wean the oxygen.  AKI on CKD stage II: Baseline creatinine around 1.2.  Creatinine trended up to 2, likely from contrast insult.  Continue gentle IV fluids..  Check BMP tomorrow  Community-acquired pneumonia: Continue current oral antibiotics to complete 7 days course.  Steroids added.CT angio showed bilateral lower  lobe airspace disease compatible with infection.  Pulmonary artery hypertension: Pulmonology following here.  On macitentan at home, continue.  Follows with pulmonology as outpatient. Has history of of ILD/crest syndrome/scleroderma  Large pericardial effusion: Has been there since 2017.  Cardiology following here.  Echo done here showed large pericardial effusion surrounding the heart, no evidence of tamponade.  Chest pressure: Likely atypical but cardiology recommending coronary CT angiogram when appropriate, likely as an outpatient.  Mildly elevated troponin with flat trend.  No ischemic changes in the EKG.  History of moderate aortic stenosis: Follows with cardiology as an outpatient  Chronic diastolic congestive heart failure: Given IV Lasix here.  Echo done here showed EF of 55 to 10%, grade 1 diastolic dysfunction.  History of GI bleed: Suspected to be from AVMs.  On PPI  History of PE: Recent CTA negative for PE.  On IVC filter  Stage IIIa CKD: Currently kidney function at baseline.  History of renal cancer/breast cancer: Currently in remission  Debility/deconditioning: Patient seen by PT/OT and  recommended outpatient follow-up       Nutrition Problem: Severe Malnutrition Etiology: chronic illness (CHF, hx cancer, ILD) Pressure Injury 03/18/22 Buttocks Right Stage 1 -  Intact skin with non-blanchable redness of a localized area usually over a bony prominence. (Active)  03/18/22 0825  Location: Buttocks  Location Orientation: Right  Staging: Stage 1 -  Intact skin with non-blanchable redness of a localized area usually over a bony prominence.  Wound Description (Comments):   Present on Admission:   Dressing Type Foam - Lift dressing to assess site every shift 03/20/22 0751    DVT prophylaxis:SCDs Start: 03/17/22 2233     Code  Status: Full Code  Family Communication: Called spouse on phone on 6/1, call not received  Patient status:Inpatient  Patient is from  :Home  Anticipated discharge ZO:XWRU  Estimated DC date: Likely tomorrow, needs to have improvement in the kidney function   Consultants: Cardiology, pulmonology  Procedures: None  Antimicrobials:  Anti-infectives (From admission, onward)    Start     Dose/Rate Route Frequency Ordered Stop   03/20/22 1000  cefdinir (OMNICEF) capsule 300 mg        300 mg Oral Every 12 hours 03/19/22 1305 03/25/22 0959   03/20/22 1000  doxycycline (VIBRA-TABS) tablet 100 mg        100 mg Oral Every 12 hours 03/19/22 1305 03/25/22 0959   03/19/22 0000  cefTRIAXone (ROCEPHIN) 2 g in sodium chloride 0.9 % 100 mL IVPB  Status:  Discontinued        2 g 200 mL/hr over 30 Minutes Intravenous Every 24 hours 03/18/22 0128 03/19/22 1305   03/18/22 0145  doxycycline (VIBRAMYCIN) 100 mg in sodium chloride 0.9 % 250 mL IVPB        100 mg 125 mL/hr over 120 Minutes Intravenous Every 12 hours 03/18/22 0128 03/19/22 2322   03/18/22 0145  cefTRIAXone (ROCEPHIN) 2 g in sodium chloride 0.9 % 100 mL IVPB        2 g 200 mL/hr over 30 Minutes Intravenous  Once 03/18/22 0140 03/18/22 0454       Subjective: Patient seen and examined at the bedside this morning.  She looks weak today.  Also on continuous oxygen.  See declines worsening shortness of breath.  She denies any chest pain.  Objective: Vitals:   03/20/22 0200 03/20/22 0602 03/20/22 0751 03/20/22 1139  BP:  (!) 128/51 (!) 125/58 (!) 124/51  Pulse:  69 78 77  Resp:  _0 Temp:  97.6 F (36.4 C) 98.4 F (36.9 C) 97.8 F (36.6 C)  TempSrc:  Oral Oral Oral  SpO2:  97% 96% 92%  Weight: 48.8 kg     Height:        Intake/Output Summary (Last 24 hours) at 03/20/2022 1157 Last data filed at 03/20/2022 0750 Gross per 24 hour  Intake 480 ml  Output 850 ml  Net -370 ml   Filed Weights   03/18/22 0825 03/19/22 0344 03/20/22 0200  Weight: 46.4 kg 47.9 kg 48.8 kg    Examination:  General exam: Very deconditioned, chronically looking, weak HEENT:  PERRL Respiratory system: Bilateral crackles Cardiovascular system: S1 & S2 heard, RRR.  Gastrointestinal system: Abdomen is nondistended, soft and nontender. Central nervous system: Alert and oriented Extremities: No edema, no clubbing ,no cyanosis Skin: No rashes, no ulcers,no icterus     Data Reviewed: I have personally reviewed following labs and imaging studies  CBC: Recent Labs  Lab 03/14/22 1048 03/17/22 1621 03/18/22 0147  WBC  --  10.9* 9.7  NEUTROABS  --  9.7*  --   HGB 11.7* 11.0* 11.5*  HCT  --  32.8* 35.8*  MCV  --  107.9* 110.8*  PLT  --  215 098   Basic Metabolic Panel: Recent Labs  Lab 03/17/22 1621 03/18/22 0147 03/19/22 0739 03/20/22 0214  NA 133* 132* 134* 135  K 4.0 3.9 4.5 4.6  CL 98 99 100 103  CO2 _1 GLUCOSE 165* 107* 115* 132*  BUN 33* 31* 43* 55*  CREATININE 1.24* 1.20* 1.78* 2.00*  CALCIUM 9.0 9.0 9.1 8.9  MG  --  1.9  --   --   PHOS  --  3.3  --   --      Recent Results (from the past 240 hour(s))  MRSA Next Gen by PCR, Nasal     Status: None   Collection Time: 03/18/22  1:27 AM   Specimen: Nasal Mucosa; Nasal Swab  Result Value Ref Range Status   MRSA by PCR Next Gen NOT DETECTED NOT DETECTED Final    Comment: (NOTE) The GeneXpert MRSA Assay (FDA approved for NASAL specimens only), is one component of a comprehensive MRSA colonization surveillance program. It is not intended to diagnose MRSA infection nor to guide or monitor treatment for MRSA infections. Test performance is not FDA approved in patients less than 24 years old. Performed at [facility], [place] [address], [place], [place] [address]   Culture, blood (routine x 2) Call MD if unable to obtain prior to antibiotics being given     Status: None (Preliminary result)   Collection Time: 03/18/22  1:43 AM   Specimen: BLOOD LEFT ARM  Result Value Ref Range Status   Specimen Description BLOOD LEFT ARM  Final   Special Requests   Final    BOTTLES DRAWN  AEROBIC AND ANAEROBIC Blood Culture adequate volume   Culture   Final    NO GROWTH 1 DAY Performed at [facility], [place] [address] [place], [place] [address]    Report Status PENDING  Incomplete  Culture, blood (routine x 2) Call MD if unable to obtain prior to antibiotics being given     Status: None (Preliminary result)   Collection Time: 03/18/22  1:47 AM   Specimen: BLOOD  Result Value Ref Range Status   Specimen Description BLOOD RIGHT ANTECUBITAL  Final   Special Requests   Final    BOTTLES DRAWN AEROBIC AND ANAEROBIC Blood Culture results may not be optimal due to an excessive volume of blood received in culture bottles   Culture   Final    NO GROWTH 1 DAY Performed at [facility], [place] [address] [place], [place] [address]    Report Status PENDING  Incomplete     Radiology Studies: No results found.  Scheduled Meds:  cefdinir  300 mg Oral Q12H   diltiazem  120 mg Oral Daily   doxycycline  100 mg Oral Q12H   feeding supplement  1 Container Oral TID BM   macitentan  10 mg Oral Daily   magic mouthwash  10 mL Oral QID   montelukast  10 mg Oral QHS   multivitamin with minerals  1 tablet Oral Daily   pantoprazole  40 mg Oral BID AC   predniSONE  20 mg Oral Q breakfast   sodium chloride flush  3 mL Intravenous Q12H   Continuous Infusions:  sodium chloride     sodium chloride 100 mL/hr at 03/20/22 0937     LOS: 2 days   Shelly Coss, MD [facility] P6/10/2021, 11:57 AM

## 2022-03-20 NOTE — Progress Notes (Signed)
NAME:  Paula Ward, MRN:  [id number], DOB:  1936-06-20, LOS: 2 ADMISSION DATE:  03/17/2022, CONSULTATION DATE: 03/18/2022 REFERRING MD: Dr. 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   Breast cancer ([place]) 1989   Left   Candida esophagitis (HCC)    Cataract    Chronic kidney disease    Chronic mild renal insuffiency   CREST syndrome (HCC)    GERD (gastroesophageal reflux disease)    w/ HH   Interstitial lung disease (HCC)    Nodule of kidney    Pericardial effusion    PONV (postoperative nausea and vomiting)    Pulmonary embolus ([place]) 2003   Pulmonary hypertension ([place])    followed by Dr Gaynell Face at [facility], now Dr. Maryjean Ka visit end 2'17.   Rectal incontinence    Renal cell carcinoma (HCC)    Scleroderma (HCC)    Tubular adenoma of colon    Uterine polyp      Significant Hospital Events: Including procedures, antibiotic start and stop dates in addition to other pertinent events   Echocardiogram-no evidence of tamponade, large effusion CT scan of the chest IMPRESSION: 1. No evidence for pulmonary embolism. 2. Large pericardial effusion. 3. Mild cardiomegaly. 4. Small bilateral  pleural effusions. 5. Bilateral lower lobe airspace disease compatible with infection. 6. Ground-glass opacities in both lungs favored as edema, although infection is not excluded. Some ground-glass areas are nodular. 7. A follow-up chest CT is recommended in 3 months to re-evaluate and confirm resolution.  Interim History / Subjective:  Cough is better Chest pressure is better Feels breathing is about the same Did not sleep well last night  Objective   Blood pressure (!) 125/58, pulse 78, temperature 98.4 F (36.9 C), temperature source Oral, resp. rate 18, height _0  (1.6 m), weight 48.8 kg, SpO2 96 %.        Intake/Output Summary (Last 24 hours) at 03/20/2022 0947 Last data filed at 03/20/2022 0750 Gross per 24 hour  Intake 480 ml  Output 1100 ml  Net -620 ml   Filed Weights   03/18/22 0825 03/19/22 0344 03/20/22 0200  Weight: 46.4 kg 47.9 kg 48.8 kg    Examination: General: Elderly, comfortable, having breakfast in bed HENT: Dry oral mucosa Lungs: Does have rales bibasilarly Cardiovascular: S1-S2 appreciated Abdomen: Soft, bowel sounds appreciated Extremities: No clubbing, no edema Neuro: Alert and oriented x3 GU: [facility] Problem list     Assessment & Plan:   Pulmonary arterial hypertension -On macitentan, 10 mg daily -No plans to change medication or dosage  History of crest syndrome Scleroderma Interstitial lung disease -Remains on oxygen supplementation at night -No significant limitations prior to recent illness -Recently completed pulmonary  rehab, was able to tolerate 20 minutes of activity  Chest pressure -This is resolved  Possible pneumonia Community-acquired pneumonia -On antibiotics -Did add steroids, on prednisone 20 daily  Decompensated heart failure Diastolic heart failure -Bilateral small effusions -Was on diuretics -Diuretics on hold at present secondary to bump in renal functions -Creatinine currently 2  Acute  kidney injury Fluid resuscitation currently undergoing -Maintain renal perfusion -Avoid nephrotoxic's  Large pericardial effusion -No evidence of tamponade on echo  Moderate aortic stenosis Moderate mitral valve disease with calcified annulus  According to cardiology notes, may require cardiac catheterization left and right catheterization to assess severe aortic disease  History of GI bleeding due to AVMs -On PPIs  IVC filter in place for history of pulmonary embolism -Most recent CTA negative for pulmonary embolism  History of renal cell cancer  Continue oral antibiotics including Omnicef and doxycycline to complete 7 days of treatment -Continue prednisone at 20  Best Practice (right click and "Reselect all SmartList Selections" daily)   Diet/type: Regular consistency (see orders) DVT prophylaxis: SCD GI prophylaxis: PPI Lines: N/A Foley:  N/A Code Status:  full code Last date of multidisciplinary goals of care discussion [pending]  Labs   CBC: Recent Labs  Lab 03/14/22 1048 03/17/22 1621 03/18/22 0147  WBC  --  10.9* 9.7  NEUTROABS  --  9.7*  --   HGB 11.7* 11.0* 11.5*  HCT  --  32.8* 35.8*  MCV  --  107.9* 110.8*  PLT  --  215 953    Basic Metabolic Panel: Recent Labs  Lab 03/17/22 1621 03/18/22 0147 03/19/22 0739 03/20/22 0214  NA 133* 132* 134* 135  K 4.0 3.9 4.5 4.6  CL 98 99 100 103  CO2 _0 GLUCOSE 165* 107* 115* 132*  BUN 33* 31* 43* 55*  CREATININE 1.24* 1.20* 1.78* 2.00*  CALCIUM 9.0 9.0 9.1 8.9  MG  --  1.9  --   --   PHOS  --  3.3  --   --    GFR: Estimated Creatinine Clearance: 16.4 mL/min (A) (by C-G formula based on SCr of 2 mg/dL (H)). Recent Labs  Lab 03/17/22 1621 03/18/22 0147  PROCALCITON  --  0.52  WBC 10.9* 9.7    Liver Function Tests: Recent Labs  Lab 03/17/22 1621 03/18/22 0147 03/18/22 1853  AST _1 ALT _2 ALKPHOS 54 54  54 54  BILITOT 1.0 0.6  0.8 0.3  PROT 6.9 6.7   6.7 6.7  ALBUMIN 3.6 3.4*  3.4* 3.3*   No results for input(s): LIPASE, AMYLASE in the last 168 hours. No results for input(s): AMMONIA in the last 168 hours.  ABG    Component Value Date/Time   HCO3 26.1 03/29/2021 0534   TCO2 23 06/15/2012 1158   O2SAT 97.1 03/29/2021 0534     Coagulation Profile: No results for input(s): INR, PROTIME in the last 168 hours.  Cardiac Enzymes: Recent Labs  Lab 03/18/22 0147  CKTOTAL 56    HbA1C: Hgb A1c MFr Bld  Date/Time Value Ref Range Status  01/06/2020 10:42 AM 5.2 4.6 - 6.5 % Final    Comment:    Glycemic Control Guidelines for People with Diabetes:Non Diabetic:  <6%Goal of Therapy: <7%Additional Action Suggested:  >8%     CBG: No results for input(s): GLUCAP in the last 168 hours.  Review of Systems:   Insomnia chest Chest pressure is resolved  Past Medical History:  She,  has a past medical history of Anemia, Angiodysplasia of stomach and duodenum, Arthus phenomenon, AVM (arteriovenous malformation), Blood transfusion without reported diagnosis, Breast cancer ([place]) (1989), Candida esophagitis ([place]), Cataract, Chronic kidney disease, CREST syndrome ([place]), GERD (gastroesophageal reflux disease), Interstitial lung disease ([place]), Nodule of kidney, Pericardial effusion, PONV (postoperative nausea and vomiting), Pulmonary embolus ([place]) (2003), Pulmonary hypertension ([place]), Rectal incontinence, Renal cell carcinoma ([place]), Scleroderma ([place]), Tubular adenoma of colon, and Uterine polyp.   Surgical History:   Past Surgical History:  Procedure Laterality Date   APPENDECTOMY  1962   BREAST LUMPECTOMY  1989   left   CATARACT EXTRACTION, BILATERAL Bilateral 12/2013   COLONOSCOPY WITH PROPOFOL N/A 04/15/2021   Procedure: COLONOSCOPY WITH PROPOFOL;  Surgeon: Milus Banister, MD;  Location: [facility];  Service: Endoscopy;  Laterality: N/A;   ENTEROSCOPY N/A 01/18/2016   Procedure: ENTEROSCOPY;  Surgeon: Mauri Pole, MD;  Location:  [facility];  Service: Endoscopy;  Laterality: N/A;   ENTEROSCOPY N/A 05/24/2018   Procedure: ENTEROSCOPY;  Surgeon: Jackquline Denmark, MD;  Location: [facility];  Service: Endoscopy;  Laterality: N/A;   ENTEROSCOPY N/A 03/29/2021   Procedure: ENTEROSCOPY;  Surgeon: Jerene Bears, MD;  Location: [facility];  Service: Gastroenterology;  Laterality: N/A;   ENTEROSCOPY N/A 04/13/2021   Procedure: ENTEROSCOPY;  Surgeon: Yetta Flock, MD;  Location: [facility];  Service: Gastroenterology;  Laterality: N/A;   ESOPHAGOGASTRODUODENOSCOPY (EGD) WITH PROPOFOL N/A 12/21/2015   Procedure: ESOPHAGOGASTRODUODENOSCOPY (EGD) WITH PROPOFOL;  Surgeon: Irene Shipper, MD;  Location: [facility];  Service: Endoscopy;  Laterality: N/A;   HOT HEMOSTASIS N/A 05/24/2018   Procedure: HOT HEMOSTASIS (ARGON PLASMA COAGULATION/BICAP);  Surgeon: Jackquline Denmark, MD;  Location[facility];  Service: Endoscopy;  Laterality: N/A;   HOT HEMOSTASIS N/A 03/29/2021   Procedure: HOT HEMOSTASIS (ARGON PLASMA COAGULATION/BICAP);  Surgeon: Jerene Bears, MD;  Location: [facility];  Service: Gastroenterology;  Laterality: N/A;   HOT HEMOSTASIS N/A 04/13/2021   Procedure: HOT HEMOSTASIS (ARGON PLASMA COAGULATION/BICAP);  Surgeon: Yetta Flock, MD;  Location: [facility];  Service: Gastroenterology;  Laterality: N/A;   IR GENERIC HISTORICAL  06/05/2016   IR RADIOLOGIST EVAL & MGMT 06/05/2016 Aletta Edouard, MD [facility] INTERV RAD   IVC Filter     KIDNEY SURGERY     left -"laser surgery by Dr. Kathlene Cote- 5 yrs ago" no removal   SCHLEROTHERAPY  05/24/2018   Procedure: SCHLEROTHERAPY;  Surgeon: Jackquline Denmark, MD;  [facility];  Service: Endoscopy;;   SUBMUCOSAL TATTOO INJECTION  04/13/2021   Procedure: SUBMUCOSAL TATTOO INJECTION;  Surgeon: Yetta Flock, MD;  Location: [facility];  Service: Gastroenterology;;   TONSILLECTOMY     TUBAL LIGATION       Social History:   reports that she has never smoked. She has  never used smokeless tobacco. She reports that she does not drink alcohol and does not use drugs.   Family History:  Her family history includes Alzheimer's disease in her mother; Bladder Cancer in her father; Diabetes in her father and sister; Esophageal cancer in her paternal uncle; Lung cancer in her sister; Prostate cancer in her father. There is no history of Colon cancer.   Allergies Allergies  Allergen Reactions   Codeine Nausea Only    Hallucinations, too   Other Nausea And Vomiting    "-mycin" antibiotics.   Also cause hallucinations.   Erythromycin Nausea And Vomiting   Lisinopril     Pt doesn't remember  Mycophenolate Mofetil Nausea Only   Sulfa Antibiotics Other (See Comments)    unknown     Home Medications  Prior to Admission medications   Medication Sig Start Date End Date Taking? Authorizing Provider  acetaminophen (TYLENOL) 325 MG tablet Take 650 mg by mouth every 6 (six) hours as needed for mild pain.    Yes [provider]  albuterol (VENTOLIN HFA) 108 (90 Base) MCG/ACT inhaler INHALE 2 PUFFS INTO THE LUNGS EVERY 6 HOURS AS NEEDED FOR WHEEZING OR SHORTNESS OF BREATH Patient taking differently: Inhale 2 puffs into the lungs every 6 (six) hours as needed for wheezing or shortness of breath. 03/06/20  Yes Hoyt Koch, MD  ALPRAZolam Duanne Moron) 0.25 MG tablet Take 0.5 tablets (0.125 mg total) by mouth at bedtime as needed. Patient taking differently: Take 0.125 mg by mouth at bedtime as needed for anxiety or sleep. 10/29/20  Yes Hoyt Koch, MD  chlorhexidine (PERIDEX) 0.12 % solution 15 mLs by Mouth Rinse route 2 (two) times daily as needed (sore mouth). 12/23/21  Yes [provider]  Cyanocobalamin (VITAMIN B-12 PO) Take 1 tablet by mouth daily. Unsure of dose   Yes [provider]  diltiazem (CARDIZEM CD) 120 MG 24 hr capsule Take 120 mg by mouth daily.   Yes [provider]  fluticasone (FLONASE) 50 MCG/ACT nasal  spray INSTILL 1 SPRAY INTO EACH NOSTRIL TWICE A DAY IF NEEDED Patient taking differently: Place 1 spray into both nostrils daily as needed for allergies. 09/18/20  Yes Hoyt Koch, MD  furosemide (LASIX) 20 MG tablet Take 1 tablet by mouth once daily Patient taking differently: Take 20 mg by mouth daily. 12/12/21  Yes Hoyt Koch, MD  guaiFENesin (MUCINEX) 600 MG 12 hr tablet Take 150 mg by mouth daily.   Yes [provider]  methylPREDNISolone (MEDROL) 4 MG tablet Take 2 mg by mouth daily. 01/22/22  Yes [provider]  montelukast (SINGULAIR) 10 MG tablet TAKE 1 TABLET BY MOUTH AT BEDTIME Patient taking differently: Take 10 mg by mouth at bedtime as needed (allergies). 03/03/22  Yes Hoyt Koch, MD  Mouthwashes (DRY MOUTH MOUTHWASH) LIQD Use as directed 5 mLs in the mouth or throat daily as needed (dry mouth). Biotene Patient taking differently: Use as directed 5 mLs in the mouth or throat at bedtime. Biotene 04/01/21  Yes Shelly Coss, MD  Multiple Vitamins-Iron (MULTIVITAMIN/IRON PO) Take 1 tablet by mouth daily.   Yes [provider]  Multiple Vitamins-Minerals (ZINC PO) Take 1 tablet by mouth daily. Unsure of dose   Yes [provider]  OPSUMIT 10 MG TABS Take 10 mg by mouth daily. 08/16/15  Yes [provider]  OXYGEN Inhale into the lungs. 2 LMP at night and upon exertion   Yes [provider]  pantoprazole (PROTONIX) 40 MG tablet Take 1 tablet (40 mg total) by mouth 2 (two) times daily before a meal. 02/27/22  Yes Nandigam, Venia Minks, MD  Pyridoxine HCl (VITAMIN B6 PO) Take 1 tablet by mouth daily. Unsure of dose   Yes [provider]  spironolactone (ALDACTONE) 25 MG tablet Take 12.5 mg by mouth daily. 06/09/20  Yes [provider]  sucralfate (CARAFATE) 1 GM/10ML suspension Take 1 g by mouth every 6 (six) hours as needed (reflux). 03/01/22  Yes [provider]  sucralfate (CARAFATE)  1 g tablet Take 1 tablet (1 g total) by mouth every 6 (six) hours as needed. Patient not taking: Reported on  03/17/2022 02/07/22   Mauri Pole, MD    Sherrilyn Rist, MD [place] [facility] Pager: See Shea Evans

## 2022-03-20 NOTE — Progress Notes (Signed)
Mobility Specialist Progress Note    03/20/22 1712  Mobility  Activity Ambulated with assistance in hallway  Level of Assistance Contact guard assist, steadying assist  Assistive Device Other (Comment) (IV pole)  Distance Ambulated (ft) 470 ft  Activity Response Tolerated well  $Mobility charge 1 Mobility   Pt received sitting EOB and agreeable. No complaints on walk. Ambulated on RA. Returned to chair with call bell in reach.    Hildred Alamin Mobility Specialist  Primary: 5N M.S. Phone: [phone] Secondary: 6N M.S. Phone: [phone]

## 2022-03-20 NOTE — Progress Notes (Signed)
Patient ID: [name], female   DOB: 1936/03/29, 86 y.o.   MRN: [id number]    Progress Note from the Palliative Medicine Team at [facility]   Patient Name: Paula Ward        Date: 03/20/2022 DOB: 1936/09/27  Age: 86 y.o. MRN#: [id number] Attending Physician: Shelly Coss, MD Primary Care Physician: Hoyt Koch, MD Admit Date: 03/17/2022   Medical records reviewed   86 y.o. 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  Cardiology and pulmonary were consulted.   Patient and her family face treatment option decisions, advanced directive decisions and anticipatory care needs.  This NP visited patient at the bedside as a follow up to initial goals of care meeting and to assess for palliative medicine needs and emotional support.  Patient tells me she feels "weaker today".  She appears tired.  Currently receiving IV fluids to address increasing creatinine.  Education offered on her multiple co -morbidities.  Education today specific to increase creatinine and importance of hydration and nutrition and overall health and wellness.  Again today education offered on the importance of continued conversation regarding current medical, and advance care planning, and anticipatory care needs not only with her medical team but with her family.  MOST form introduced  I offered again to place a call to her  daughter,  who Elley tells me would be her main decision maker in the event that she cannot make her own decisions,  but patient declines, "let me talk to her first".  Patient is high risk for decompensation secondary to multiple comorbidities.  Education and recommendation for outpatient palliative community-based services, Ms. Lindsley is open to suggestion.  Questions and concerns addressed      Paula Lessen NP  Palliative Medicine Team Team Phone # [phone] Pager [phone]

## 2022-03-20 NOTE — Progress Notes (Addendum)
Progress Note  Patient Name: Paula Ward Date of Encounter: 03/20/2022  [facility] Cardiologist: Kirk Ruths, MD   Subjective   Patient states she does not feel too well today, believed her breathing is little worse, reports that her oxygen level was dropping overnight causing the alarm to go off. She continue to be chest pain free. She is urinating.   Inpatient Medications    Scheduled Meds:  cefdinir  300 mg Oral Q12H   diltiazem  120 mg Oral Daily   doxycycline  100 mg Oral Q12H   feeding supplement  1 Container Oral TID BM   macitentan  10 mg Oral Daily   montelukast  10 mg Oral QHS   multivitamin with minerals  1 tablet Oral Daily   pantoprazole  40 mg Oral BID AC   predniSONE  20 mg Oral Q breakfast   sodium chloride flush  3 mL Intravenous Q12H   Continuous Infusions:  sodium chloride     sodium chloride 100 mL/hr at 03/20/22 0937   PRN Meds: sodium chloride, acetaminophen **OR** acetaminophen, albuterol, ALPRAZolam, HYDROcodone-acetaminophen, sodium chloride flush, sucralfate   Vital Signs    Vitals:   03/19/22 2123 03/20/22 0200 03/20/22 0602 03/20/22 0751  BP: (!) 144/51  (!) 128/51 (!) 125/58  Pulse: 71  69 78  Resp: _0 Temp: 97.6 F (36.4 C)  97.6 F (36.4 C) 98.4 F (36.9 C)  TempSrc: Oral  Oral Oral  SpO2: 98%  97% 96%  Weight:  48.8 kg    Height:        Intake/Output Summary (Last 24 hours) at 03/20/2022 0945 Last data filed at 03/20/2022 0750 Gross per 24 hour  Intake 480 ml  Output 1100 ml  Net -620 ml      03/20/2022    2:00 AM 03/19/2022    3:44 AM 03/18/2022    8:25 AM  Last 3 Weights  Weight (lbs) 107 lb 8 oz 105 lb 8 oz 102 lb 4.8 oz  Weight (kg) 48.762 kg 47.854 kg 46.403 kg      Telemetry    SR - Personally Reviewed  ECG    No new tracing today - Personally Reviewed  Physical Exam   GEN: No acute distress. Cachexia   Neck: No JVD Cardiac: RRR, systolic murmur grade III at RUSB Respiratory: Clear to  auscultation bilaterally. On [place] oxygen.  GI: Soft, nontender MS:No leg edema  Neuro:  Nonfocal  Psych: Normal affect   Labs    High Sensitivity Troponin:   Recent Labs  Lab 03/17/22 1623 03/17/22 1811  TROPONINIHS 30* 26*     Chemistry Recent Labs  Lab 03/17/22 1621 03/18/22 0147 03/18/22 1853 03/19/22 0739 03/20/22 0214  NA 133* 132*  --  134* 135  K 4.0 3.9  --  4.5 4.6  CL 98 99  --  100 103  CO2 24 23  --  23 23  GLUCOSE 165* 107*  --  115* 132*  BUN 33* 31*  --  43* 55*  CREATININE 1.24* 1.20*  --  1.78* 2.00*  CALCIUM 9.0 9.0  --  9.1 8.9  MG  --  1.9  --   --   --   PROT 6.9 6.7  6.7 6.7  --   --   ALBUMIN 3.6 3.4*  3.4* 3.3*  --   --   AST _1 --   --   ALT 13  _0 --   --   ALKPHOS 54 54  54 54  --   --   BILITOT 1.0 0.6  0.8 0.3  --   --   GFRNONAA 43* 45*  --  28* 24*  ANIONGAP 11 10  --  11 9    Lipids No results for input(s): CHOL, TRIG, HDL, LABVLDL, LDLCALC, CHOLHDL in the last 168 hours.  Hematology Recent Labs  Lab 03/14/22 1048 03/17/22 1621 03/18/22 0147  WBC  --  10.9* 9.7  RBC  --  3.04* 3.23*  HGB 11.7* 11.0* 11.5*  HCT  --  32.8* 35.8*  MCV  --  107.9* 110.8*  MCH  --  36.2* 35.6*  MCHC  --  33.5 32.1  RDW  --  13.5 13.5  PLT  --  215 244   Thyroid No results for input(s): TSH, FREET4 in the last 168 hours.  BNP Recent Labs  Lab 03/17/22 1622  BNP 2,025.3*    DDimer No results for input(s): DDIMER in the last 168 hours.   Radiology    ECHOCARDIOGRAM COMPLETE  Result Date: 03/18/2022    ECHOCARDIOGRAM REPORT   Patient Name:   Paula Ward Date of Exam: 03/18/2022 Medical Rec #:  [id number]       Height:       63.0 in Accession #:    [id number]      Weight:       102.3 lb Date of Birth:  05-22-1936       BSA:          1.454 m Patient Age:    86 years        BP:           126/59 mmHg Patient Gender: F               HR:           90 bpm. Exam Location:  Inpatient Procedure: 2D Echo, Cardiac Doppler, Color  Doppler and Strain Analysis Indications:    Pericardial effusion  History:        Patient has prior history of Echocardiogram examinations, most                 recent 09/08/2018. Pericardial Disease; Pulmonary HTN. AICD                 04/15/21.  Sonographer:    Luisa Hart RDCS Referring Phys: [place]  1. Left ventricular ejection fraction, by estimation, is 55 to 60%. The left ventricle has normal function. The left ventricle has no regional wall motion abnormalities. There is moderate left ventricular hypertrophy. Left ventricular diastolic parameters are consistent with Grade I diastolic dysfunction (impaired relaxation). Elevated left atrial pressure.  2. Right ventricular systolic function is normal. The right ventricular size is normal.  3. Left atrial size was severely dilated.  4. Large pericardial effusion surrounds heart Most prominent along lateral side of LV 24 mm in maximal dimension. There is no evidence by echo criteria for hemodynamic compromise. Mitral inflow shows no signficant respiratory variation, there is no RV collapse, IVC is normal size. . Large pleural effusion.  5. Signficant mitral annular calcification. Mitral leaflets are thickened with moderately restricted motion. Peak and mean gradients through the valve are 12 and 7 mm Hg respectively. (HR =91 bpm). The mitral valve is abnormal. Trivial mitral valve regurgitation. No evidence of mitral stenosis. Moderate to severe mitral annular calcification.  6. AV is thickened, calcified with restricted motion. Peak and mean gradients through the valve are 45 and 25 mm Hg respectively Dimensionless index is 0.26. Overall consistent with moderate AS. Compared to echo report from Jan 2023 [facility]), no significant change. . The aortic valve is tricuspid. Aortic valve regurgitation is not visualized. FINDINGS  Left Ventricle: Left ventricular ejection fraction, by estimation, is 55 to 60%. The left ventricle has normal  function. The left ventricle has no regional wall motion abnormalities. The left ventricular internal cavity size was small. There is moderate  left ventricular hypertrophy. Left ventricular diastolic parameters are consistent with Grade I diastolic dysfunction (impaired relaxation). Elevated left atrial pressure. Right Ventricle: The right ventricular size is normal. Right vetricular wall thickness was not assessed. Right ventricular systolic function is normal. Left Atrium: Left atrial size was severely dilated. Right Atrium: Right atrial size was normal in size. Pericardium: Large pericardial effusion surrounds heart Most prominent along lateral side of LV 24 mm in maximal dimension. There is no evidence by echo criteria for hemodynamic compromise. Mitral inflow shows no signficant respiratory variation, there is no RV collapse, IVC is normal size. Mitral Valve: Signficant mitral annular calcification. Mitral leaflets are thickened with moderately restricted motion. Peak and mean gradients through the valve are 12 and 7 mm Hg respectively. (HR =91 bpm). The mitral valve is abnormal. There is moderate thickening of the mitral valve leaflet(s). There is mild calcification of the mitral valve leaflet(s). Moderate to severe mitral annular calcification. Trivial mitral valve regurgitation. No evidence of mitral valve stenosis. MV peak gradient, 12.4 mmHg. The mean mitral valve gradient is 7.0 mmHg. Tricuspid Valve: The tricuspid valve is normal in structure. Tricuspid valve regurgitation is mild. Aortic Valve: AV is thickened, calcified with restricted motion. Peak and mean gradients through the valve are 45 and 25 mm Hg respectively Dimensionless index is 0.26. Overall consistent with moderate AS. Compared to echo report from Jan 2023 [facility]), no  significant change. The aortic valve is tricuspid. Aortic valve regurgitation is not visualized. Aortic valve mean gradient measures 26.0 mmHg. Aortic valve peak gradient  measures 61.2 mmHg. Aortic valve area, by VTI measures 0.77 cm. Pulmonic Valve: The pulmonic valve was grossly normal. Pulmonic valve regurgitation is not visualized. Aorta: The aortic root and ascending aorta are structurally normal, with no evidence of dilitation. IAS/Shunts: No atrial level shunt detected by color flow Doppler. Additional Comments: There is a large pleural effusion.  LEFT VENTRICLE PLAX 2D LVIDd:         3.30 cm     Diastology LVIDs:         2.00 cm     LV e' medial:    3.68 cm/s LV PW:         1.50 cm     LV E/e' medial:  38.2 LV IVS:        1.60 cm     LV e' lateral:   3.59 cm/s LVOT diam:     1.55 cm     LV E/e' lateral: 39.1 LV SV:         49 LV SV Index:   34 LVOT Area:     1.89 cm  LV Volumes (MOD) LV vol d, MOD A2C: 56.2 ml LV vol d, MOD A4C: 62.1 ml LV vol s, MOD A2C: 23.6 ml LV vol s, MOD A4C: 27.0 ml LV SV MOD A2C:     32.6 ml LV SV MOD A4C:     62.1 ml  LV SV MOD BP:      35.7 ml RIGHT VENTRICLE RV Basal diam:  3.30 cm RV Mid diam:    1.70 cm RV S prime:     14.30 cm/s TAPSE (M-mode): 1.2 cm LEFT ATRIUM              Index        RIGHT ATRIUM           Index LA Vol (A2C):   126.0 ml 86.64 ml/m  RA Area:     13.30 cm LA Vol (A4C):   86.6 ml  59.55 ml/m  RA Volume:   26.80 ml  18.43 ml/m LA Biplane Vol: 109.0 ml 74.95 ml/m  AORTIC VALVE                     PULMONIC VALVE AV Area (Vmax):    0.77 cm      PV Vmax:       0.90 m/s AV Area (Vmean):   0.83 cm      PV Vmean:      63.300 cm/s AV Area (VTI):     0.77 cm      PV VTI:        0.171 m AV Vmax:           391.25 cm/s   PV Peak grad:  3.2 mmHg AV Vmean:          244.000 cm/s  PV Mean grad:  2.0 mmHg AV VTI:            0.643 m AV Peak Grad:      61.2 mmHg AV Mean Grad:      26.0 mmHg LVOT Vmax:         160.33 cm/s LVOT Vmean:        106.867 cm/s LVOT VTI:          0.261 m LVOT/AV VTI ratio: 0.41  AORTA Ao Root diam: 3.30 cm Ao Asc diam:  3.00 cm MITRAL VALVE                TRICUSPID VALVE MV Area (PHT): 3.45 cm     TR Peak grad:    40.2 mmHg MV Area VTI:   1.29 cm     TR Vmax:        317.00 cm/s MV Peak grad:  12.4 mmHg MV Mean grad:  7.0 mmHg     SHUNTS MV Vmax:       1.76 m/s     Systemic VTI:  0.26 m MV Vmean:      120.0 cm/s   Systemic Diam: 1.55 cm MV Decel Time: 220 msec MV E velocity: 140.50 cm/s MV A velocity: 172.50 cm/s MV E/A ratio:  0.81 Dorris Carnes MD Electronically signed by Dorris Carnes MD Signature Date/Time: 03/18/2022/2:12:48 PM    Final     Cardiac Studies   Echo from 03/18/2022:   1. Left ventricular ejection fraction, by estimation, is 55 to 60%. The  left ventricle has normal function. The left ventricle has no regional  wall motion abnormalities. There is moderate left ventricular hypertrophy.  Left ventricular diastolic  parameters are consistent with Grade I diastolic dysfunction (impaired  relaxation). Elevated left atrial pressure.   2. Right ventricular systolic function is normal. The right ventricular  size is normal.   3. Left atrial size was severely dilated.   4. Large pericardial effusion surrounds heart Most prominent along  lateral side of LV 24  mm in maximal dimension. There is no evidence by  echo criteria for hemodynamic compromise. Mitral inflow shows no  signficant respiratory variation, there is no RV  collapse, IVC is normal size. . Large pleural effusion.   5. Signficant mitral annular calcification. Mitral leaflets are thickened  with moderately restricted motion. Peak and mean gradients through the  valve are 12 and 7 mm Hg respectively. (HR =91 bpm). The mitral valve is  abnormal. Trivial mitral valve  regurgitation. No evidence of mitral stenosis. Moderate to severe mitral  annular calcification.   6. AV is thickened, calcified with restricted motion. Peak and mean  gradients through the valve are 45 and 25 mm Hg respectively Dimensionless  index is 0.26. Overall consistent with moderate AS. Compared to echo  report from Jan 2023 [facility]), no  significant change. . The  aortic valve is tricuspid. Aortic valve  regurgitation is not visualized.   CTA chest 03/17/22:  1. No evidence for pulmonary embolism. 2. Large pericardial effusion. 3. Mild cardiomegaly. 4. Small bilateral pleural effusions. 5. Bilateral lower lobe airspace disease compatible with infection. 6. Ground-glass opacities in both lungs favored as edema, although infection is not excluded. Some ground-glass areas are nodular. 7. A follow-up chest CT is recommended in 3 months to re-evaluate and confirm resolution.   Patient Profile     86 y.o. female with PMH of pulmonary arterial hypertension secondary to limited scleroderma with possible contribution of ILD (group 3), chronic pericardial effusion 2003, moderate aortic stenosis, PE, GI bleed 2/2 AVMs s/p IVC, CREST syndrome, chronic anemia, breast CA, renal cell CA, who is being seen 03/17/2022 for the evaluation of large pericardial effusion and elevated troponin.   Assessment & Plan    Chest pain, resolved  - Presented with chest pressure and shortness of breath, not related to activity - Hs trop 30 >26  - EKG without acute ischemic change - 2003 cardiac catheterization revealed normal coronary arteries - Echo from 03/18/2022 with LVEF 55 to 60%,no RWMA, grade I DD - None coronary CT from admission showed calcification in the distribution of right and left main coronary artery as well as proximal to mid LAD - new onset AKI since 03/19/22,  cancelled CT coronary study given CIN, may consider eventual left and right heart cath when AKI recovers, this can be done outpatient as she has no chest pain or acute findings suggestive of ACS   Large pericardial effusion -Patient has chronic pericardial effusion dating back to 2003 -Echo from 03/18/2022 revealed large circumferential pericardial effusion -No clinical or echocardiogram evidence of cardiac tamponade  Moderate aortic stenosis Moderate mitral stenosis  -Echo from 03/18/2022 with LVEF  55 to 60%,no RWMA, grade I DD, large pericardial effusion, trivial MR, moderate to severe mitral annular calcification, moderate aortic stenosis with peak gradient 45 mmHg, mean gradient 25 mmHg, dimensionless index 0.26;  Mitral leaflets are thickened with moderately restricted motion. Peak and mean gradients through the valve are 12 and 7 mm Hg respectively. -Follow-up outpatient with structural heart team  AKI on CKD II, new since 03/19/22 - Cr continue rising to 2 today, trend BMP, agree with gentle IVF, monitor UOP, consider nephrology consult if needed   Chronic diastolic heart failure  - last heart cath 11/22/21 at [facility]: RA 7, RV 50/8, PA 50/21 mean 32, wedge 21 ; Cardiac output is 6 and index of 4.1 L/min/m2.  PVR is 1.8 WU  and SVR of 10.7 WU with NIBP 141/54 mean 71  - No  indication for further pulmonary vasodilator per cath report - clinically euvolemic today  - Diuresis as needed, on lasix 71m daily with spironolactone 12.561mdaily, hold both for AKI now   Presumed pneumonia  Pulmonary arterial hypertension Scleroderma Pulmonary fibrosis /Interstitial lung disease  Chronic hypoxic respiratory failure  Hx of CREST Syndrome  -Diuresis as needed -Follows [facility], pulmonology  is following, on antibiotic and macitentan and diltiazem currently   Chronic anemia Hx of GI bleed Hx of PE s/p IVC filter  Hx of renal cell cancer Hx of  breast cancer -Managed per medicine     For questions or updates, please contact [facility] Please consult [email or web address] for contact info under        Signed, XiMargie BilletNP  03/20/2022, 9:45 AM     I have seen and examined the patient along with XiMargie BilletNP .  I have reviewed the chart, notes and new data.  I agree with PA/NP's note.  Key new complaints: no chest pain, has to "constantly clear her throat" Key examination changes: No clinical evidence of hypervolemia/tamponade on exam Key new findings / data: Creatinine continues to worsen,  although at a slower pace.  Maintaining good urine output.  PLAN: Plans for evaluation for aortic stenosis/CAD with right and left heart catheterization on hold until recovery of renal function  MiSanda KleinMD, [facility]ial[facility] ([phone]/10/2021, 11:10 AM

## 2022-03-21 DIAGNOSIS — I3139 Other pericardial effusion (noninflammatory): Secondary | ICD-10-CM | POA: Diagnosis not present

## 2022-03-21 LAB — BASIC METABOLIC PANEL
Anion gap: 6 (ref 5–15)
BUN: 59 mg/dL — ABNORMAL HIGH (ref 8–23)
CO2: 23 mmol/L (ref 22–32)
Calcium: 8.6 mg/dL — ABNORMAL LOW (ref 8.9–10.3)
Chloride: 109 mmol/L (ref 98–111)
Creatinine, Ser: 1.64 mg/dL — ABNORMAL HIGH (ref 0.44–1.00)
GFR, Estimated: 31 mL/min — ABNORMAL LOW (ref >60)
Glucose, Bld: 138 mg/dL — ABNORMAL HIGH (ref 70–99)
Potassium: 4.6 mmol/L (ref 3.5–5.1)
Sodium: 138 mmol/L (ref 135–145)

## 2022-03-21 MED ORDER — SPIRONOLACTONE 12.5 MG HALF TABLET
12.5000 mg | ORAL_TABLET | Freq: Every day | ORAL | Status: DC
  Administered 2022-03-21 – 2022-03-22 (×2): 12.5 mg via ORAL
  Filled 2022-03-21 (×2): qty 1

## 2022-03-21 MED ORDER — FUROSEMIDE 20 MG PO TABS
20.0000 mg | ORAL_TABLET | Freq: Every day | ORAL | Status: DC
  Administered 2022-03-21 – 2022-03-22 (×2): 20 mg via ORAL
  Filled 2022-03-21 (×2): qty 1

## 2022-03-21 NOTE — Progress Notes (Signed)
Physical Therapy Treatment Patient Details Name: DENICA WEB MRN: [id number] DOB: 05-29-1936 Today's Date: 03/21/2022   History of Present Illness 86 y.o. female admitted with Atypical chest pain with moderate to severe pericardial effusion with acute on chronic diastolic CHF and PNA.  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 She is Mod independent with bed mobiltiy and transfers, and ambulated ~ 360' with min guard and no device. Pt is steady during gait with no overt LOB, she demonstrates mild balance impairments and scored 16/24 indicating she remains at risk for falls. Recommending pt follow up with OPPT for balance/strength training and continue to mobilize with RN/NT staff in hospital. Pt has no further acute PT needs, goasl met at safe level to discharge home. Will sign off at this time   Recommendations for follow up therapy are one component of a multi-disciplinary discharge planning process, led by the attending physician.  Recommendations may be updated based on patient status, additional functional criteria and insurance authorization.  Follow Up Recommendations   OPPT     Assistance Recommended at Discharge Intermittent Supervision/Assistance  Patient can return home with the following A little help with walking and/or transfers;Assistance with cooking/housework;Assist for transportation;Help with stairs or ramp for entrance   Equipment Recommendations  None recommended by PT    Recommendations for Other Services       Precautions / Restrictions Precautions Precautions: Fall Restrictions Weight Bearing  Restrictions: No     Mobility  Bed Mobility                    Transfers Overall transfer level: Modified independent Equipment used: None Transfers: Sit to/from Stand Sit to Stand: Modified independent (Device/Increase time)           General transfer comment: Pt moved about freely from bed to bathroom with supervision. Needed repeated VC's to stand at sink. Washed hands at sink with supervision only.    Ambulation/Gait Ambulation/Gait assistance: Min guard Gait Distance (Feet): 360 Feet Assistive device: None Gait Pattern/deviations: WFL(Within Functional Limits), Step-through pattern, Decreased stride length, Narrow base of support        Pt demonstrated consistent stepping throughout gait with limited arm swing, no LOB noted.   Stairs             Wheelchair Mobility    Modified Rankin (Stroke Patients Only)       Balance Overall balance assessment: Modified Independent Sitting-balance support: No upper extremity supported Sitting balance-Leahy Scale: Good     Standing balance support: No upper extremity supported Standing balance-Leahy Scale: Good   Single Leg Stance - Right Leg: 2 Single Leg Stance - Left Leg: 4             Standardized Balance Assessment Standardized Balance Assessment : Dynamic Gait Index   Dynamic Gait Index Level Surface: Normal Change in Gait Speed: Mild Impairment Gait with Horizontal Head Turns: Mild Impairment Gait with Vertical Head Turns: Moderate Impairment Gait and Pivot Turn: Mild Impairment Step Over Obstacle: Mild Impairment Step Around Obstacles: Mild Impairment Steps: Mild Impairment (Not formally tested (SLS Bil, 2-4 sec)) Total Score: 16  Cognition Arousal/Alertness: Awake/alert Behavior During Therapy: WFL for tasks assessed/performed Overall Cognitive Status: Within Functional Limits for tasks assessed                                 General Comments: Pt  demonstrated normal cognition throughout therapy.        Exercises      General Comments        Pertinent Vitals/Pain Pain Assessment Pain Assessment: No/denies pain    Home Living                          Prior Function            PT Goals (current goals can now be found in the care plan section) Acute Rehab PT Goals Patient Stated Goal: To go home PT Goal Formulation: With patient Time For Goal Achievement: 04/01/22 Potential to Achieve Goals: Good Progress towards PT goals: Goals met/education completed, patient discharged from PT;    Frequency    Min 2X/week      PT Plan Current plan remains appropriate    Co-evaluation              AM-PAC PT "6 Clicks" Mobility   Outcome Measure  Help needed turning from your back to your side while in a flat bed without using bedrails?: None Help needed moving from lying on your back to sitting on the side of a flat bed without using bedrails?: None Help needed moving to and from a bed to a chair (including a wheelchair)?: A Little Help needed standing up from a chair using your arms (e.g., wheelchair or bedside chair)?: A Little Help needed to walk in hospital room?: A Little Help needed climbing 3-5 steps with a railing? : A Little 6 Click Score: 20    End of Session Equipment Utilized During Treatment: Gait belt Activity Tolerance: Patient tolerated treatment well Patient left: in chair;with call bell/phone within reach Nurse Communication: Mobility status PT Visit Diagnosis: Unsteadiness on feet (R26.81);Other abnormalities of gait and mobility (R26.89)     Time: 2563-8937 PT Time Calculation (min) (ACUTE ONLY): 26 min  Charges:  $Gait Training: 8-22 mins $Therapeutic Activity: 8-22 mins                    Margie Ege, SPT [place] 03/21/2022, 1:29 PM

## 2022-03-21 NOTE — Progress Notes (Addendum)
Progress Note  Patient Name: Paula Ward Date of Encounter: 03/21/2022  [facility] Cardiologist: Kirk Ruths, MD   Subjective   Has gained 5 lbs two days in a row. Will restart diuretics today, renal function improving. Has cough  Inpatient Medications    Scheduled Meds:  cefdinir  300 mg Oral Q12H   diltiazem  120 mg Oral Daily   doxycycline  100 mg Oral Q12H   feeding supplement  1 Container Oral TID BM   macitentan  10 mg Oral Daily   magic mouthwash  10 mL Oral QID   montelukast  10 mg Oral QHS   multivitamin with minerals  1 tablet Oral Daily   pantoprazole  40 mg Oral BID AC   predniSONE  20 mg Oral Q breakfast   sodium chloride flush  3 mL Intravenous Q12H   Continuous Infusions:  sodium chloride     PRN Meds: sodium chloride, acetaminophen **OR** acetaminophen, albuterol, ALPRAZolam, HYDROcodone-acetaminophen, sodium chloride flush, sucralfate   Vital Signs    Vitals:   03/20/22 1631 03/20/22 2000 03/21/22 0650 03/21/22 0825  BP: (!) 132/56 (!) 131/50 (!) 148/58 (!) 144/64  Pulse: 96 85 83   Resp: _0 Temp: 98.2 F (36.8 C) 98 F (36.7 C) 97.7 F (36.5 C)   TempSrc: Oral Oral Oral   SpO2: 93% 91% 90%   Weight:   65.4 kg   Height:        Intake/Output Summary (Last 24 hours) at 03/21/2022 1050 Last data filed at 03/21/2022 0531 Gross per 24 hour  Intake 1428.22 ml  Output 1500 ml  Net -71.78 ml      03/21/2022    6:50 AM 03/20/2022    2:00 AM 03/19/2022    3:44 AM  Last 3 Weights  Weight (lbs) 144 lb 1.6 oz 107 lb 8 oz 105 lb 8 oz  Weight (kg) 65.363 kg 48.762 kg 47.854 kg      Telemetry    Sinus in the 90s - Personally Reviewed  ECG    No new tracings - Personally Reviewed  Physical Exam   GEN: No acute distress.   Neck: + JVD Cardiac: RRR, 4/6 systolic murmur Respiratory: Clear to auscultation bilaterally. GI: Soft, nontender, non-distended  MS: No edema; No deformity. Neuro:  Nonfocal  Psych: Normal affect    Labs    High Sensitivity Troponin:   Recent Labs  Lab 03/17/22 1623 03/17/22 1811  TROPONINIHS 30* 26*     Chemistry Recent Labs  Lab 03/17/22 1621 03/18/22 0147 03/18/22 1853 03/19/22 0739 03/20/22 0214 03/21/22 0043  NA 133* 132*  --  134* 135 138  K 4.0 3.9  --  4.5 4.6 4.6  CL 98 99  --  100 103 109  CO2 24 23  --  _1 GLUCOSE 165* 107*  --  115* 132* 138*  BUN 33* 31*  --  43* 55* 59*  CREATININE 1.24* 1.20*  --  1.78* 2.00* 1.64*  CALCIUM 9.0 9.0  --  9.1 8.9 8.6*  MG  --  1.9  --   --   --   --   PROT 6.9 6.7  6.7 6.7  --   --   --   ALBUMIN 3.6 3.4*  3.4* 3.3*  --   --   --   AST _2 --   --   --   ALT 13 13  14 12  --   --   --   ALKPHOS 54 54  54 54  --   --   --   BILITOT 1.0 0.6  0.8 0.3  --   --   --   GFRNONAA 43* 45*  --  28* 24* 31*  ANIONGAP 11 10  --  _0 Lipids No results for input(s): CHOL, TRIG, HDL, LABVLDL, LDLCALC, CHOLHDL in the last 168 hours.  Hematology Recent Labs  Lab 03/17/22 1621 03/18/22 0147  WBC 10.9* 9.7  RBC 3.04* 3.23*  HGB 11.0* 11.5*  HCT 32.8* 35.8*  MCV 107.9* 110.8*  MCH 36.2* 35.6*  MCHC 33.5 32.1  RDW 13.5 13.5  PLT 215 244   Thyroid No results for input(s): TSH, FREET4 in the last 168 hours.  BNP Recent Labs  Lab 03/17/22 1622  BNP 2,025.3*    DDimer No results for input(s): DDIMER in the last 168 hours.   Radiology    No results found.  Cardiac Studies   Echo from 03/18/2022:    1. Left ventricular ejection fraction, by estimation, is 55 to 60%. The  left ventricle has normal function. The left ventricle has no regional  wall motion abnormalities. There is moderate left ventricular hypertrophy.  Left ventricular diastolic  parameters are consistent with Grade I diastolic dysfunction (impaired  relaxation). Elevated left atrial pressure.   2. Right ventricular systolic function is normal. The right ventricular  size is normal.   3. Left atrial size was severely  dilated.   4. Large pericardial effusion surrounds heart Most prominent along  lateral side of LV 24 mm in maximal dimension. There is no evidence by  echo criteria for hemodynamic compromise. Mitral inflow shows no  signficant respiratory variation, there is no RV  collapse, IVC is normal size. . Large pleural effusion.   5. Signficant mitral annular calcification. Mitral leaflets are thickened  with moderately restricted motion. Peak and mean gradients through the  valve are 12 and 7 mm Hg respectively. (HR =91 bpm). The mitral valve is  abnormal. Trivial mitral valve  regurgitation. No evidence of mitral stenosis. Moderate to severe mitral  annular calcification.   6. AV is thickened, calcified with restricted motion. Peak and mean  gradients through the valve are 45 and 25 mm Hg respectively Dimensionless  index is 0.26. Overall consistent with moderate AS. Compared to echo  report from Jan 2023 [facility]), no  significant change. . The aortic valve is tricuspid. Aortic valve  regurgitation is not visualized.    CTA chest 03/17/22:   1. No evidence for pulmonary embolism. 2. Large pericardial effusion. 3. Mild cardiomegaly. 4. Small bilateral pleural effusions. 5. Bilateral lower lobe airspace disease compatible with infection. 6. Ground-glass opacities in both lungs favored as edema, although infection is not excluded. Some ground-glass areas are nodular. 7. A follow-up chest CT is recommended in 3 months to re-evaluate and confirm resolution.  Patient Profile     86 y.o. 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  Assessment & Plan    Chest pain CE low  and flat EKG nonischemic Normal coronaries on remote heart cath (2003) Echo this admission  with normal EF and no WMA No CT coronary or heart cath this admission due to contrast induced nephropathy Given aortic stenosis and likely need for TAVR, would consider deferring CT coronary and completing right and left heart catheterization.    AKI Contrast induced nephropathy She has known history of CIN. Creatinine trended up to 2.00, now trending down to 1.64. Continue to hydrate PO.    Chronic diastolic heart failure Last heart cath 11/22/21 at [facility]: RA 7, RV 50/8, PA 50/21 mean 32, wedge 21 ; Cardiac output is 6 and index of 4.1 L/min/m2.  PVR is 1.8 WU  and SVR of 10.7 WU with NIBP 141/54 mean 71  - diuresis and spiro on hold for AKI - PTA on 20 mg lasix daily - resume spiro and lasix today given 10 lb weight gain   Large pericardial effusion Chronic pericardial effusion dating back to 2003 Echo this admission with large circumferential pericardial effusion but no clinical or echocardiogram evidence of cardiac tamponade Follow clinically, was not placed on colchicine.  May consider repeating echo in 2-3 months to evaluate effusion   Pulmonary arterial hypertension Scleroderma, hx of CREST ILD Chronic respiratory failure Follows at [facility], on steroid taper Continue macitentan    Chronic anemia Hx of GI bleed Hx of PE s/p IVC filter  Hx of renal cell cancer Hx of  breast cancer -Managed per medicine   She would like [facility] to manage her cardiovascular health. She requests Dr. Burt Knack specifically of the structural heart team.      For questions or updates, please contact [place] Please consult [email or web address] for contact info under        Signed, Ledora Bottcher, PA  03/21/2022, 10:50 AM    I have seen and examined the patient along with Ledora Bottcher, PA .  I have reviewed the chart, notes and new data.  I agree with PA/NP's note.  Key new complaints:  Denies dyspnea at rest.  No chest discomfort. Key examination changes: Elevated JVP, no peripheral edema (but she has been mostly supine).  Substantial increase above her usual weight.  Note that a weight of 144 pounds was charted (but she actually weighed 114 pounds; this is roughly 10 pounds more than her usual weight). Key new findings / data: Encouraging marked improvement in creatinine, down to 1.6.  PLAN: Hypervolemia following intravenous fluids administered for contrast nephrotoxicity. Resume her usual diuretics. Monitor overnight for urine output, recheck renal function in the morning. In the future she will require cardiac catheterization and CT angiography of the aorta in preparation for TAVR.  These procedures should be performed with the minimal possible out of contrast and with preprocedural IV fluid loading.  Sanda Klein, MD, [place] [phone] 03/21/2022, 11:52 AM

## 2022-03-21 NOTE — Progress Notes (Signed)
Heart Failure Navigator Progress Note  Assessed for Heart & Vascular TOC clinic readiness.  Patient is followed by [facility] for Pulmonary HTN, Crcl <20.   Navigator available for reassessment of patient.   Earnestine Leys, BSN, Clinical cytogeneticist Only

## 2022-03-21 NOTE — Care Management Important Message (Signed)
Important Message  Patient Details  Name: PARISS HOMMES MRN: [id number] Date of Birth: [birth year]   Medicare Important Message Given:  Yes     Shelda Altes 03/21/2022, 8:45 AM

## 2022-03-21 NOTE — Progress Notes (Signed)
PROGRESS NOTE  Paula Ward  JYN:[id number] DOB: 03-10-1936 DOA: 03/17/2022 PCP: Hoyt Koch, MD   Brief Narrative: Patient is 86 year old female with history of pulm hypertension secondary to limited scleroderma, on oxygen at nighttime at home, chronic pericardial effusion, moderate aortic stenosis, PE, GI bleed secondary to AVMs status post IVC filter, crest syndrome, breast cancer, renal cell carcinoma, CKD stage II, GERD who presented here with complaint of substernal chest pain, worsening shortness of breath from baseline.  In the emergency department CT angiogram was done which showed large pericardial effusion along with possible pneumonia.  Admitted for further management.  Cardiology, Pulmonology consulted.  Currently she denies chest pain and her dyspnea at baseline.  Hospital course remarkable for new onset AKI,,now improving.  Plan for discharge tomorrow.  Assessment & Plan:  Principal Problem:   Pericardial effusion Active Problems:   Chest pain, atypical   Acute on chronic diastolic CHF (congestive heart failure) (HCC)   Anemia, iron deficiency   Pulmonary hypertension (HCC)   Essential hypertension   CKD (chronic kidney disease) stage 3, GFR 30-59 ml/min (HCC)   ILD (interstitial lung disease) (HCC)   Elevated troponin   CAP (community acquired pneumonia)   Pressure injury of skin   AKI (acute kidney injury) ([place])   Protein-calorie malnutrition, severe   Acute on chronic hypoxic respiratory failure: Secondary to multifactorial etiology: Pneumonia, ILD, pericardial effusion, chronic diastolic congestive heart failure.  She is oxygen at home at nighttime.  She was on room air this morning  AKI on CKD stage II: Baseline creatinine around 1.2.  Creatinine trended up to 2, likely from contrast insult.  Now improving with IV fluids.  IV fluids will be stopped due to weight gain.  Community-acquired pneumonia: Continue current oral antibiotics to complete 7 days  course.  Steroids added.CT angio showed bilateral lower lobe airspace disease compatible with infection.  Pulmonary artery hypertension: Pulmonology following here.  On macitentan at home, continue.  Follows with pulmonology as outpatient. Has history of of ILD/crest syndrome/scleroderma  Large pericardial effusion: Has been there since 2017.  Cardiology following here.  Echo done here showed large pericardial effusion surrounding the heart, no evidence of tamponade.  Chest pressure: Likely atypical but cardiology recommending coronary CT angiogram when appropriate, likely as an outpatient.  Mildly elevated troponin with flat trend.  No ischemic changes in the EKG.  History of moderate aortic stenosis: Follows with cardiology as an outpatient  Chronic diastolic congestive heart failure: .  Echo done here showed EF of 55 to 86%, grade 1 diastolic dysfunction.  Plan is to resume home diuretics today.  On spironolactone and Lasix.  History of GI bleed: Suspected to be from AVMs.  On PPI  History of PE: Recent CTA negative for PE.  On IVC filter  Stage IIIa CKD: Currently kidney function at baseline.  History of renal cancer/breast cancer: Currently in remission  Debility/deconditioning: Patient seen by PT/OT and  recommended outpatient follow-up       Nutrition Problem: Severe Malnutrition Etiology: chronic illness (CHF, hx cancer, ILD) Pressure Injury 03/18/22 Buttocks Right Stage 1 -  Intact skin with non-blanchable redness of a localized area usually over a bony prominence. (Active)  03/18/22 0825  Location: Buttocks  Location Orientation: Right  Staging: Stage 1 -  Intact skin with non-blanchable redness of a localized area usually over a bony prominence.  Wound Description (Comments):   Present on Admission:   Dressing Type Foam - Lift dressing to assess site every  shift 03/21/22 1100    DVT prophylaxis:SCDs Start: 03/17/22 2233     Code Status: Full Code  Family  Communication: Called spouse on phone on 6/1, call not received  Patient status:Inpatient  Patient is from :Home  Anticipated discharge OE:VOJJ  Estimated DC date: Likely tomorrow   Consultants: Cardiology, pulmonology  Procedures: None  Antimicrobials:  Anti-infectives (From admission, onward)    Start     Dose/Rate Route Frequency Ordered Stop   03/20/22 1000  cefdinir (OMNICEF) capsule 300 mg        300 mg Oral Every 12 hours 03/19/22 1305 03/25/22 0959   03/20/22 1000  doxycycline (VIBRA-TABS) tablet 100 mg        100 mg Oral Every 12 hours 03/19/22 1305 03/25/22 0959   03/19/22 0000  cefTRIAXone (ROCEPHIN) 2 g in sodium chloride 0.9 % 100 mL IVPB  Status:  Discontinued        2 g 200 mL/hr over 30 Minutes Intravenous Every 24 hours 03/18/22 0128 03/19/22 1305   03/18/22 0145  doxycycline (VIBRAMYCIN) 100 mg in sodium chloride 0.9 % 250 mL IVPB        100 mg 125 mL/hr over 120 Minutes Intravenous Every 12 hours 03/18/22 0128 03/19/22 2322   03/18/22 0145  cefTRIAXone (ROCEPHIN) 2 g in sodium chloride 0.9 % 100 mL IVPB        2 g 200 mL/hr over 30 Minutes Intravenous  Once 03/18/22 0140 03/18/22 0093       Subjective: Patient seen and examined at bedside this morning.  Hemodynamically stable.  She looks better today.  Sitting at the edge of the bed, eating her breakfast.  On room air during my evaluation.  Objective: Vitals:   03/20/22 1631 03/20/22 2000 03/21/22 0650 03/21/22 0825  BP: (!) 132/56 (!) 131/50 (!) 148/58 (!) 144/64  Pulse: 96 85 83   Resp: _0 Temp: 98.2 F (36.8 C) 98 F (36.7 C) 97.7 F (36.5 C)   TempSrc: Oral Oral Oral   SpO2: 93% 91% 90%   Weight:   65.4 kg   Height:        Intake/Output Summary (Last 24 hours) at 03/21/2022 1141 Last data filed at 03/21/2022 0531 Gross per 24 hour  Intake 1428.22 ml  Output 1500 ml  Net -71.78 ml   Filed Weights   03/19/22 0344 03/20/22 0200 03/21/22 0650  Weight: 47.9 kg 48.8 kg 65.4 kg     Examination:  General exam: Overall comfortable, not in distress, very pleasant elderly female, deconditioned HEENT: PERRL Respiratory system: Bilateral coarse crackles  cardiovascular system: S1 & S2 heard, RRR.  Gastrointestinal system: Abdomen is nondistended, soft and nontender. Central nervous system: Alert and oriented Extremities: No edema, no clubbing ,no cyanosis Skin: No rashes, no ulcers,no icterus     Data Reviewed: I have personally reviewed following labs and imaging studies  CBC: Recent Labs  Lab 03/17/22 1621 03/18/22 0147  WBC 10.9* 9.7  NEUTROABS 9.7*  --   HGB 11.0* 11.5*  HCT 32.8* 35.8*  MCV 107.9* 110.8*  PLT 215 818   Basic Metabolic Panel: Recent Labs  Lab 03/17/22 1621 03/18/22 0147 03/19/22 0739 03/20/22 0214 03/21/22 0043  NA 133* 132* 134* 135 138  K 4.0 3.9 4.5 4.6 4.6  CL 98 99 100 103 109  CO2 _1 GLUCOSE 165* 107* 115* 132* 138*  BUN 33* 31* 43* 55* 59*  CREATININE 1.24* 1.20* 1.78* 2.00*  1.64*  CALCIUM 9.0 9.0 9.1 8.9 8.6*  MG  --  1.9  --   --   --   PHOS  --  3.3  --   --   --      Recent Results (from the past 240 hour(s))  MRSA Next Gen by PCR, Nasal     Status: None   Collection Time: 03/18/22  1:27 AM   Specimen: Nasal Mucosa; Nasal Swab  Result Value Ref Range Status   MRSA by PCR Next Gen NOT DETECTED NOT DETECTED Final    Comment: (NOTE) The GeneXpert MRSA Assay (FDA approved for NASAL specimens only), is one component of a comprehensive MRSA colonization surveillance program. It is not intended to diagnose MRSA infection nor to guide or monitor treatment for MRSA infections. Test performance is not FDA approved in patients less than 20 years old. Performed at [facility], [place] [address] [place], [place] [address]   Culture, blood (routine x 2) Call MD if unable to obtain prior to antibiotics being given     Status: None (Preliminary result)   Collection Time: 03/18/22  1:43 AM    Specimen: BLOOD LEFT ARM  Result Value Ref Range Status   Specimen Description BLOOD LEFT ARM  Final   Special Requests   Final    BOTTLES DRAWN AEROBIC AND ANAEROBIC Blood Culture adequate volume   Culture   Final    NO GROWTH 3 DAYS Performed at [facility], [place] [address] [place], [place] [address]    Report Status PENDING  Incomplete  Culture, blood (routine x 2) Call MD if unable to obtain prior to antibiotics being given     Status: None (Preliminary result)   Collection Time: 03/18/22  1:47 AM   Specimen: BLOOD  Result Value Ref Range Status   Specimen Description BLOOD RIGHT ANTECUBITAL  Final   Special Requests   Final    BOTTLES DRAWN AEROBIC AND ANAEROBIC Blood Culture results may not be optimal due to an excessive volume of blood received in culture bottles   Culture   Final    NO GROWTH 3 DAYS Performed at [facility], [place] [address], [place], [place] [address]    Report Status PENDING  Incomplete     Radiology Studies: No results found.  Scheduled Meds:  cefdinir  300 mg Oral Q12H   diltiazem  120 mg Oral Daily   doxycycline  100 mg Oral Q12H   feeding supplement  1 Container Oral TID BM   furosemide  20 mg Oral Daily   macitentan  10 mg Oral Daily   magic mouthwash  10 mL Oral QID   montelukast  10 mg Oral QHS   multivitamin with minerals  1 tablet Oral Daily   pantoprazole  40 mg Oral BID AC   predniSONE  20 mg Oral Q breakfast   sodium chloride flush  3 mL Intravenous Q12H   spironolactone  12.5 mg Oral Daily   Continuous Infusions:  sodium chloride       LOS: 3 days   Shelly Coss, MD [facility] P6/11/2021, 11:41 AM

## 2022-03-21 NOTE — Progress Notes (Signed)
NAME:  Paula Ward, MRN:  [id number], DOB:  1936-01-17, LOS: 3 ADMISSION DATE:  03/17/2022, CONSULTATION DATE: 03/18/2022 REFERRING MD: Dr. 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   Breast cancer ([place]) 1989   Left   Candida esophagitis (HCC)    Cataract    Chronic kidney disease    Chronic mild renal insuffiency   CREST syndrome (HCC)    GERD (gastroesophageal reflux disease)    w/ HH   Interstitial lung disease (HCC)    Nodule of kidney    Pericardial effusion    PONV (postoperative nausea and vomiting)    Pulmonary embolus ([place]) 2003   Pulmonary hypertension ([place])    followed by Dr Gaynell Face at [facility], now Dr. Maryjean Ka visit end 2'17.   Rectal incontinence    Renal cell carcinoma (HCC)    Scleroderma (HCC)    Tubular adenoma of colon    Uterine polyp    Significant Hospital Events: Including procedures, antibiotic start and stop dates in addition to other pertinent events   Echocardiogram-no evidence of tamponade, large effusion CT scan of the chest IMPRESSION: 1. No evidence for pulmonary embolism. 2. Large pericardial effusion. 3. Mild cardiomegaly. 4. Small bilateral  pleural effusions. 5. Bilateral lower lobe airspace disease compatible with infection. 6. Ground-glass opacities in both lungs favored as edema, although infection is not excluded. Some ground-glass areas are nodular. 7. A follow-up chest CT is recommended in 3 months to re-evaluate and confirm resolution.  Interim History / Subjective:  Denies any significant chest pressure Feeling better today Rested well Was able to tolerate ambulation Off oxygen supplementation  Objective   Blood pressure (!) 144/64, pulse 83, temperature 97.7 F (36.5 C), temperature source Oral, resp. rate 18, height _0  (1.6 m), weight 65.4 kg, SpO2 90 %.        Intake/Output Summary (Last 24 hours) at 03/21/2022 1158 Last data filed at 03/21/2022 0531 Gross per 24 hour  Intake 1428.22 ml  Output 1500 ml  Net -71.78 ml   Filed Weights   03/19/22 0344 03/20/22 0200 03/21/22 0650  Weight: 47.9 kg 48.8 kg 65.4 kg    Examination: General: Elderly, comfortable, out of bed in chair  HENT: Dry oral mucosa  lungs: Bibasal rales Cardiovascular: S1-S2 appreciated Abdomen: Soft, bowel sounds appreciated Extremities: No clubbing, no edema Neuro: Alert and oriented x3 GU: [facility] Problem list     Assessment & Plan:   Pulmonary arterial hypertension -On macitentan -Continue 10 mg daily  History of crest syndrome Scleroderma Interstitial lung disease -Continue oxygen supplementation at night -Feels stronger today -Was able to tolerate physical therapy activity  Possible pneumonia Community acquired pneumonia -On antibiotics and  steroids  Decompensated heart failure Diastolic heart failure -Bilateral pleural effusions -Diuretics had to be held for bump in renal function  Acute kidney injury -Renal parameters are better today -Creatinine did improve  She is up about 10 pounds so we have to be cautious about fluid resuscitation as well  Large pericardial effusion -No  evidence of tamponade on echo  Moderate aortic stenosis Moderate mitral valve disease with calcified annulus -For possible cardiac cath in the near future   History of GI bleeding due to AVMs -On PPIs  IVC filter in place for pulmonary embolism -Most recent CTA negative for pulmonary embolism  History of renal cell cancer  Continue oral antibiotics including Omnicef and doxycycline to complete 7 days of treatment -Continue prednisone at 20  Best Practice (right click and "Reselect all SmartList Selections" daily)   Diet/type: Regular consistency (see orders) DVT prophylaxis: SCD GI prophylaxis: PPI Lines: N/A Foley:  N/A Code Status:  full code Last date of multidisciplinary goals of care discussion [pending]  Labs   CBC: Recent Labs  Lab 03/17/22 1621 03/18/22 0147  WBC 10.9* 9.7  NEUTROABS 9.7*  --   HGB 11.0* 11.5*  HCT 32.8* 35.8*  MCV 107.9* 110.8*  PLT 215 242    Basic Metabolic Panel: Recent Labs  Lab 03/17/22 1621 03/18/22 0147 03/19/22 0739 03/20/22 0214 03/21/22 0043  NA 133* 132* 134* 135 138  K 4.0 3.9 4.5 4.6 4.6  CL 98 99 100 103 109  CO2 _0 GLUCOSE 165* 107* 115* 132* 138*  BUN 33* 31* 43* 55* 59*  CREATININE 1.24* 1.20* 1.78* 2.00* 1.64*  CALCIUM 9.0 9.0 9.1 8.9 8.6*  MG  --  1.9  --   --   --   PHOS  --  3.3  --   --   --    GFR: Estimated Creatinine Clearance: 23.6 mL/min (A) (by C-G formula based on SCr of 1.64 mg/dL (H)). Recent Labs  Lab 03/17/22 1621 03/18/22 0147  PROCALCITON  --  0.52  WBC 10.9* 9.7    Liver Function Tests: Recent Labs  Lab 03/17/22 1621 03/18/22 0147 03/18/22 1853  AST _1 ALT _2 ALKPHOS 54 54  54 54  BILITOT 1.0 0.6  0.8 0.3  PROT 6.9 6.7  6.7 6.7  ALBUMIN 3.6 3.4*  3.4* 3.3*   No results for input(s): LIPASE, AMYLASE in the last 168 hours. No results for input(s): AMMONIA in the last 168 hours.  ABG    Component Value Date/Time   HCO3 26.1  03/29/2021 0534   TCO2 23 06/15/2012 1158   O2SAT 97.1 03/29/2021 0534     Coagulation Profile: No results for input(s): INR, PROTIME in the last 168 hours.  Cardiac Enzymes: Recent Labs  Lab 03/18/22 0147  CKTOTAL 56    HbA1C: Hgb A1c MFr Bld  Date/Time Value Ref Range Status  01/06/2020 10:42 AM 5.2 4.6 - 6.5 % Final    Comment:    Glycemic Control Guidelines for People with Diabetes:Non Diabetic:  <6%Goal of Therapy: <7%Additional Action Suggested:  >8%     CBG: No results for input(s): GLUCAP in the last 168 hours.  Review of Systems:   Insomnia chest Chest pressure is resolved  Past Medical History:  She,  has a past medical history of Anemia, Angiodysplasia of stomach and duodenum, Arthus phenomenon, AVM (arteriovenous malformation), Blood transfusion without reported diagnosis, Breast cancer ([place]) (  1989), Candida esophagitis ([place]), Cataract, Chronic kidney disease, CREST syndrome ([place]), GERD (gastroesophageal reflux disease), Interstitial lung disease ([place]), Nodule of kidney, Pericardial effusion, PONV (postoperative nausea and vomiting), Pulmonary embolus ([place]) (2003), Pulmonary hypertension ([place]), Rectal incontinence, Renal cell carcinoma ([place]), Scleroderma ([place]), Tubular adenoma of colon, and Uterine polyp.   Surgical History:   Past Surgical History:  Procedure Laterality Date   APPENDECTOMY  1962   BREAST LUMPECTOMY  1989   left   CATARACT EXTRACTION, BILATERAL Bilateral 12/2013   COLONOSCOPY WITH PROPOFOL N/A 04/15/2021   Procedure: COLONOSCOPY WITH PROPOFOL;  Surgeon: Milus Banister, MD;  Location: [facility];  Service: Endoscopy;  Laterality: N/A;   ENTEROSCOPY N/A 01/18/2016   Procedure: ENTEROSCOPY;  Surgeon: Mauri Pole, MD;  Location: [facility];  Service: Endoscopy;  Laterality: N/A;   ENTEROSCOPY N/A 05/24/2018   Procedure: ENTEROSCOPY;  Surgeon: Jackquline Denmark, MD;  Location: [facility];  Service: Endoscopy;  Laterality: N/A;   ENTEROSCOPY  N/A 03/29/2021   Procedure: ENTEROSCOPY;  Surgeon: Jerene Bears, MD;  Location: [facility];  Service: Gastroenterology;  Laterality: N/A;   ENTEROSCOPY N/A 04/13/2021   Procedure: ENTEROSCOPY;  Surgeon: Yetta Flock, MD;  Location: [facility];  Service: Gastroenterology;  Laterality: N/A;   ESOPHAGOGASTRODUODENOSCOPY (EGD) WITH PROPOFOL N/A 12/21/2015   Procedure: ESOPHAGOGASTRODUODENOSCOPY (EGD) WITH PROPOFOL;  Surgeon: Irene Shipper, MD;  Location: [facility];  Service: Endoscopy;  Laterality: N/A;   HOT HEMOSTASIS N/A 05/24/2018   Procedure: HOT HEMOSTASIS (ARGON PLASMA COAGULATION/BICAP);  Surgeon: Jackquline Denmark, MD;  Location[facility];  Service: Endoscopy;  Laterality: N/A;   HOT HEMOSTASIS N/A 03/29/2021   Procedure: HOT HEMOSTASIS (ARGON PLASMA COAGULATION/BICAP);  Surgeon: Jerene Bears, MD;  Location: [facility];  Service: Gastroenterology;  Laterality: N/A;   HOT HEMOSTASIS N/A 04/13/2021   Procedure: HOT HEMOSTASIS (ARGON PLASMA COAGULATION/BICAP);  Surgeon: Yetta Flock, MD;  Location: [facility];  Service: Gastroenterology;  Laterality: N/A;   IR GENERIC HISTORICAL  06/05/2016   IR RADIOLOGIST EVAL & MGMT 06/05/2016 Aletta Edouard, MD [facility] INTERV RAD   IVC Filter     KIDNEY SURGERY     left -"laser surgery by Dr. Kathlene Cote- 5 yrs ago" no removal   SCHLEROTHERAPY  05/24/2018   Procedure: SCHLEROTHERAPY;  Surgeon: Jackquline Denmark, MD;  Location: [facility];  Service: Endoscopy;;   SUBMUCOSAL TATTOO INJECTION  04/13/2021   Procedure: SUBMUCOSAL TATTOO INJECTION;  Surgeon: Yetta Flock, MD;  Location: [facility];  Service: Gastroenterology;;   TONSILLECTOMY     TUBAL LIGATION       Social History:   reports that she has never smoked. She has never used smokeless tobacco. She reports that she does not drink alcohol and does not use drugs.   Family History:  Her family history includes Alzheimer's disease in her mother; Bladder Cancer in her father;  Diabetes in her father and sister; Esophageal cancer in her paternal uncle; Lung cancer in her sister; Prostate cancer in her father. There is no history of Colon cancer.   Allergies Allergies  Allergen Reactions   Codeine Nausea Only    Hallucinations, too   Other Nausea And Vomiting    "-mycin" antibiotics.   Also cause hallucinations.   Erythromycin Nausea And Vomiting   Lisinopril     Pt doesn't remember    Mycophenolate Mofetil Nausea Only   Sulfa Antibiotics Other (See Comments)    unknown    Sherrilyn Rist, MD [place] [facility] Pager: See Shea Evans

## 2022-03-22 ENCOUNTER — Telehealth: Payer: Self-pay | Admitting: Emergency Medicine

## 2022-03-22 DIAGNOSIS — J189 Pneumonia, unspecified organism: Secondary | ICD-10-CM

## 2022-03-22 DIAGNOSIS — I5030 Unspecified diastolic (congestive) heart failure: Secondary | ICD-10-CM

## 2022-03-22 DIAGNOSIS — J90 Pleural effusion, not elsewhere classified: Secondary | ICD-10-CM

## 2022-03-22 DIAGNOSIS — J849 Interstitial pulmonary disease, unspecified: Secondary | ICD-10-CM | POA: Diagnosis not present

## 2022-03-22 DIAGNOSIS — Z978 Presence of other specified devices: Secondary | ICD-10-CM

## 2022-03-22 DIAGNOSIS — I3139 Other pericardial effusion (noninflammatory): Secondary | ICD-10-CM | POA: Diagnosis not present

## 2022-03-22 DIAGNOSIS — I272 Pulmonary hypertension, unspecified: Secondary | ICD-10-CM | POA: Diagnosis not present

## 2022-03-22 LAB — BASIC METABOLIC PANEL
Anion gap: 7 (ref 5–15)
BUN: 58 mg/dL — ABNORMAL HIGH (ref 8–23)
CO2: 25 mmol/L (ref 22–32)
Calcium: 8.8 mg/dL — ABNORMAL LOW (ref 8.9–10.3)
Chloride: 106 mmol/L (ref 98–111)
Creatinine, Ser: 1.56 mg/dL — ABNORMAL HIGH (ref 0.44–1.00)
GFR, Estimated: 33 mL/min — ABNORMAL LOW (ref >60)
Glucose, Bld: 113 mg/dL — ABNORMAL HIGH (ref 70–99)
Potassium: 4.4 mmol/L (ref 3.5–5.1)
Sodium: 138 mmol/L (ref 135–145)

## 2022-03-22 MED ORDER — CEFDINIR 300 MG PO CAPS: 300.0000 mg | ORAL_CAPSULE | Freq: Two times a day (BID) | ORAL | 0 refills | Status: AC

## 2022-03-22 MED ORDER — PREDNISONE 20 MG PO TABS: 20.0000 mg | ORAL_TABLET | Freq: Every day | ORAL | 0 refills | Status: AC

## 2022-03-22 MED ORDER — DOXYCYCLINE HYCLATE 100 MG PO TABS: 100.0000 mg | ORAL_TABLET | Freq: Two times a day (BID) | ORAL | 0 refills | Status: AC

## 2022-03-22 MED ORDER — MAGIC MOUTHWASH: 10.0000 mL | Freq: Four times a day (QID) | ORAL | 2 refills | Status: DC | PRN

## 2022-03-22 NOTE — Discharge Summary (Signed)
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  In the emergency department CT angiogram was done which showed large pericardial effusion along with possible pneumonia.  Admitted for further management.  Cardiology, Pulmonology consulted.  Currently she denies chest pain and her dyspnea at baseline.  Hospital course remarkable for new onset AKI,,now improving.  Volume status is optimal at current condition.  Cardiology, pulmonology cleared her for discharge.  Medically stable for discharge to home.  Following problems were addressed during her hospitalization:  Acute on chronic hypoxic respiratory failure: Secondary to multifactorial etiology: Pneumonia, ILD, pericardial effusion, chronic diastolic congestive heart failure.  She is oxygen at home at nighttime.  She was on room air this morning   AKI on CKD stage II: Baseline creatinine around 1.2.  Creatinine trended up to 2, likely from contrast insult.  Now improved with IV fluids. Check BMP during follow-up with cardiology   Community-acquired pneumonia: Continue current oral antibiotics to complete 7 days course.  Steroids added.CT angio showed bilateral lower lobe airspace disease compatible with infection.   Pulmonary artery hypertension: Pulmonology following  here.  On macitentan at home, continue.  Follows with pulmonology as outpatient. Has history of of ILD/crest syndrome/scleroderma.  Pulmonology recommended follow-up in 4 to 6 weeks for CT chest to ensure resolution of infiltrates   Large pericardial effusion: Has been there since 2017.  Cardiology following here.  Echo done here showed large pericardial effusion surrounding the heart, no evidence of tamponade.   Chest pressure: Likely atypical but cardiology recommending coronary CT angiogram when appropriate, likely as an outpatient.  Mildly elevated troponin with flat trend.  No ischemic changes in the EKG.   History of moderate aortic stenosis: Follows with cardiology as an outpatient.  She has an appointment with Dr. Burt Knack on 04/11/2022   Chronic diastolic congestive heart failure: .  Echo done here showed EF of 55 to 25%, grade 1 diastolic dysfunction.  Plan is to resume home diuretics today.  On spironolactone and Lasix.   History of GI bleed: Suspected to be from AVMs.  On PPI   History of PE: Recent CTA negative for PE.  On IVC filter    History of renal cancer/breast cancer: Currently in remission   Debility/deconditioning: Patient seen by PT/OT and  recommended outpatient follow-up       Discharge Diagnoses:  Principal Problem:   Pericardial effusion Active Problems:   Chest pain, atypical   Acute on chronic diastolic CHF (congestive heart failure) (HCC)   Anemia, iron deficiency   Pulmonary hypertension (HCC)   Essential hypertension   CKD (chronic kidney disease) stage 3, GFR 30-59 ml/min (HCC)   ILD (interstitial lung disease) (HCC)   Elevated troponin   CAP (community acquired pneumonia)   Pressure injury of skin   AKI (acute kidney injury) ([place])   Protein-calorie malnutrition, severe    Discharge Instructions  Discharge Instructions     Ambulatory referral  to Physical Therapy   Complete by: As directed    Physical Therapy- Evaluation and treatment-    Diet - low sodium heart healthy   Complete by: As directed    Discharge instructions   Complete by: As directed    1)Please take prescribed medications as instructed 2)Follow up with cardiology on 6/15.  Do a BMP , BNP tests during the follow-up 3)Follow up with Dr. Burt Knack on 04/11/2022 4)Follow up with your pulmonologist in 2 weeks.   Increase activity slowly   Complete by: As directed    No wound care   Complete by: As directed       Allergies as of 03/22/2022       Reactions   Codeine Nausea Only   Hallucinations, too   Other Nausea And Vomiting   "-mycin" antibiotics.   Also cause hallucinations.   Erythromycin Nausea And Vomiting   Lisinopril    Pt doesn't remember    Mycophenolate Mofetil Nausea Only   Sulfa Antibiotics Other (See Comments)   unknown        Medication List     STOP taking these medications    Dry Mouth Mouthwash Liqd   methylPREDNISolone 4 MG tablet Commonly known as: MEDROL       TAKE these medications    acetaminophen 325 MG tablet Commonly known as: TYLENOL Take 650 mg by mouth every 6 (six) hours as needed for mild pain.   albuterol 108 (90 Base) MCG/ACT inhaler Commonly known as: VENTOLIN HFA INHALE 2 PUFFS INTO THE LUNGS EVERY 6 HOURS AS NEEDED FOR WHEEZING OR SHORTNESS OF BREATH What changed:  how much to take how to take this when to take this reasons to take this additional instructions   ALPRAZolam 0.25 MG tablet Commonly known as: XANAX Take 0.5 tablets (0.125 mg total) by mouth at bedtime as needed. What changed: reasons to take this   cefdinir 300 MG capsule Commonly known as: OMNICEF Take 1 capsule (300 mg total) by mouth every 12 (twelve) hours for 3 days.   chlorhexidine 0.12 % solution Commonly known as: PERIDEX 15 mLs by Mouth Rinse route 2 (two) times daily as needed (sore mouth).   diltiazem 120 MG 24 hr capsule Commonly known as: CARDIZEM CD Take 120 mg by mouth daily.   doxycycline 100 MG  tablet Commonly known as: VIBRA-TABS Take 1 tablet (100 mg total) by mouth every 12 (twelve) hours for 3 days.   fluticasone 50 MCG/ACT nasal spray Commonly known as: FLONASE INSTILL 1 SPRAY INTO EACH NOSTRIL TWICE A DAY IF NEEDED What changed: See the new instructions.   furosemide 20 MG tablet Commonly known as: LASIX Take 1 tablet by mouth once daily   guaiFENesin 600 MG 12 hr tablet Commonly known as: MUCINEX Take 150 mg by mouth daily.   magic mouthwash Soln Take 10 mLs by mouth 4 (four) times daily as needed for mouth pain.   montelukast 10 MG tablet Commonly known as: SINGULAIR TAKE 1 TABLET BY MOUTH AT BEDTIME What changed:  when to take this reasons to take this   MULTIVITAMIN/IRON PO Take 1 tablet by mouth daily.   Opsumit 10 MG tablet Generic drug: macitentan Take 10 mg by mouth daily.   OXYGEN Inhale into the lungs. 2 LMP at night and upon exertion   pantoprazole 40 MG tablet Commonly known as: Protonix Take 1 tablet (40 mg total) by mouth 2 (two) times daily before a meal.   predniSONE 20 MG tablet  Commonly known as: DELTASONE Take 1 tablet (20 mg total) by mouth daily with breakfast for 2 days. Start taking on: March 23, 2022   spironolactone 25 MG tablet Commonly known as: ALDACTONE Take 12.5 mg by mouth daily.   sucralfate 1 GM/10ML suspension Commonly known as: CARAFATE Take 1 g by mouth every 6 (six) hours as needed (reflux). What changed: Another medication with the same name was removed. Continue taking this medication, and follow the directions you see here.   VITAMIN B-12 PO Take 1 tablet by mouth daily. Unsure of dose   VITAMIN B6 PO Take 1 tablet by mouth daily. Unsure of dose   ZINC PO Take 1 tablet by mouth daily. Unsure of dose        Follow-up [facility] Follow up.   Specialty: Rehabilitation Why: Outpatient PT-office to call with a visit time. If the office has not  called within 3-5 business days please call them. Contact information: [place]        Sherren Mocha, MD. Go on 04/08/2022.   Specialty: Cardiology Why: @ 11:20 to talk about your aortic valve problem Contact information: [address] [place] [address] [phone]                Allergies  Allergen Reactions   Codeine Nausea Only    Hallucinations, too   Other Nausea And Vomiting    "-mycin" antibiotics.   Also cause hallucinations.   Erythromycin Nausea And Vomiting   Lisinopril     Pt doesn't remember    Mycophenolate Mofetil Nausea Only   Sulfa Antibiotics Other (See Comments)    unknown    Consultations: Cardiology, pulmonology   Procedures/Studies: CT Angio Chest Pulmonary Embolism (PE) W or WO Contrast  Result Date: 03/18/2022 CLINICAL DATA:  Chest pain EXAM: CT ANGIOGRAPHY CHEST WITH CONTRAST TECHNIQUE: Multidetector CT imaging of the chest was performed using the standard protocol during bolus administration of intravenous contrast. Multiplanar CT image reconstructions and MIPs were obtained to evaluate the vascular anatomy. RADIATION DOSE REDUCTION: This exam was performed according to the departmental dose-optimization program which includes automated exposure control, adjustment of the mA and/or kV according to patient size and/or use of iterative reconstruction technique. CONTRAST:  59m OMNIPAQUE IOHEXOL 350 MG/ML SOLN COMPARISON:  CT chest 03/30/2021 FINDINGS: Cardiovascular: The heart is mildly enlarged. There is a large pericardial effusion which is low-density. Aorta is normal in size. There are atherosclerotic calcifications of the aorta. There is adequate opacification of the pulmonary arteries to the segmental level. There is no evidence for pulmonary embolism. Mediastinum/Nodes: No enlarged mediastinal, hilar, or axillary lymph nodes. Thyroid gland,  trachea, and esophagus demonstrate no significant findings. Lungs/Pleura: There are small bilateral pleural effusions, left greater than right. There is airspace consolidation of the inferior left lower lobe and superior segment of the right lower lobe. There are patchy and confluent ground-glass opacities bilaterally. There also ill-defined ground-glass nodular densities measuring up to 8 mm in the upper lungs. Peripheral reticular opacities in peripheral patchy airspace opacities persist and have mildly increased in the left upper lobe. Trachea and central airways appear patent. There is no evidence for pneumothorax. Upper Abdomen: No acute abnormality. Musculoskeletal: No chest wall abnormality. No acute or significant osseous findings. Review of the MIP images confirms the above findings. IMPRESSION: 1. No evidence for pulmonary embolism. 2. Large pericardial effusion. 3.  Mild cardiomegaly. 4. Small bilateral pleural effusions. 5. Bilateral lower lobe airspace disease compatible with infection. 6. Ground-glass opacities in both lungs favored as edema, although infection is not excluded. Some ground-glass areas are nodular. 7. A follow-up chest CT is recommended in 3 months to re-evaluate and confirm resolution. Electronically Signed   By: Ronney Asters M.D.   On: 03/18/2022 00:03   DG Chest Port 1 View  Result Date: 03/17/2022 CLINICAL DATA:  Shortness of breath.  Weakness. EXAM: PORTABLE CHEST 1 VIEW COMPARISON:  03/30/2021 FINDINGS: Marked cardiac enlargement is stable. Diffuse pulmonary interstitial prominence also stable. Infiltrate or atelectasis in left lower lobe also shows no significant change. IMPRESSION: Left lobe atelectasis versus infiltrate, without significant change. Stable cardiomegaly and diffuse pulmonary interstitial prominence. Electronically Signed   By: Marlaine Hind M.D.   On: 03/17/2022 17:06   ECHOCARDIOGRAM COMPLETE  Result Date: 03/18/2022    ECHOCARDIOGRAM REPORT   Patient  Name:   Paula Ward Date of Exam: 03/18/2022 Medical Rec #:  [id number]       Height:       63.0 in Accession #:    [id number]      Weight:       102.3 lb Date of Birth:  11-16-1935       BSA:          1.454 m Patient Age:    75 years        BP:           126/59 mmHg Patient Gender: F               HR:           90 bpm. Exam Location:  Inpatient Procedure: 2D Echo, Cardiac Doppler, Color Doppler and Strain Analysis Indications:    Pericardial effusion  History:        Patient has prior history of Echocardiogram examinations, most                 recent 09/08/2018. Pericardial Disease; Pulmonary HTN. AICD                 04/15/21.  Sonographer:    Luisa Hart RDCS Referring Phys: [place]  1. Left ventricular ejection fraction, by estimation, is 55 to 60%. The left ventricle has normal function. The left ventricle has no regional wall motion abnormalities. There is moderate left ventricular hypertrophy. Left ventricular diastolic parameters are consistent with Grade I diastolic dysfunction (impaired relaxation). Elevated left atrial pressure.  2. Right ventricular systolic function is normal. The right ventricular size is normal.  3. Left atrial size was severely dilated.  4. Large pericardial effusion surrounds heart Most prominent along lateral side of LV 24 mm in maximal dimension. There is no evidence by echo criteria for hemodynamic compromise. Mitral inflow shows no signficant respiratory variation, there is no RV collapse, IVC is normal size. . Large pleural effusion.  5. Signficant mitral annular calcification. Mitral leaflets are thickened with moderately restricted motion. Peak and mean gradients through the valve are 12 and 7 mm Hg respectively. (HR =91 bpm). The mitral valve is abnormal. Trivial mitral valve regurgitation. No evidence of mitral stenosis. Moderate to severe mitral annular calcification.  6. AV is thickened, calcified with restricted motion. Peak and mean  gradients through the valve are 45 and 25 mm Hg respectively Dimensionless index is 0.26. Overall consistent with moderate AS. Compared to echo report from Jan 2023 [facility]), no significant change. Marland Kitchen  The aortic valve is tricuspid. Aortic valve regurgitation is not visualized. FINDINGS  Left Ventricle: Left ventricular ejection fraction, by estimation, is 55 to 60%. The left ventricle has normal function. The left ventricle has no regional wall motion abnormalities. The left ventricular internal cavity size was small. There is moderate  left ventricular hypertrophy. Left ventricular diastolic parameters are consistent with Grade I diastolic dysfunction (impaired relaxation). Elevated left atrial pressure. Right Ventricle: The right ventricular size is normal. Right vetricular wall thickness was not assessed. Right ventricular systolic function is normal. Left Atrium: Left atrial size was severely dilated. Right Atrium: Right atrial size was normal in size. Pericardium: Large pericardial effusion surrounds heart Most prominent along lateral side of LV 24 mm in maximal dimension. There is no evidence by echo criteria for hemodynamic compromise. Mitral inflow shows no signficant respiratory variation, there is no RV collapse, IVC is normal size. Mitral Valve: Signficant mitral annular calcification. Mitral leaflets are thickened with moderately restricted motion. Peak and mean gradients through the valve are 12 and 7 mm Hg respectively. (HR =91 bpm). The mitral valve is abnormal. There is moderate thickening of the mitral valve leaflet(s). There is mild calcification of the mitral valve leaflet(s). Moderate to severe mitral annular calcification. Trivial mitral valve regurgitation. No evidence of mitral valve stenosis. MV peak gradient, 12.4 mmHg. The mean mitral valve gradient is 7.0 mmHg. Tricuspid Valve: The tricuspid valve is normal in structure. Tricuspid valve regurgitation is mild. Aortic Valve: AV is thickened,  calcified with restricted motion. Peak and mean gradients through the valve are 45 and 25 mm Hg respectively Dimensionless index is 0.26. Overall consistent with moderate AS. Compared to echo report from Jan 2023 [facility]), no  significant change. The aortic valve is tricuspid. Aortic valve regurgitation is not visualized. Aortic valve mean gradient measures 26.0 mmHg. Aortic valve peak gradient measures 61.2 mmHg. Aortic valve area, by VTI measures 0.77 cm. Pulmonic Valve: The pulmonic valve was grossly normal. Pulmonic valve regurgitation is not visualized. Aorta: The aortic root and ascending aorta are structurally normal, with no evidence of dilitation. IAS/Shunts: No atrial level shunt detected by color flow Doppler. Additional Comments: There is a large pleural effusion.  LEFT VENTRICLE PLAX 2D LVIDd:         3.30 cm     Diastology LVIDs:         2.00 cm     LV e' medial:    3.68 cm/s LV PW:         1.50 cm     LV E/e' medial:  38.2 LV IVS:        1.60 cm     LV e' lateral:   3.59 cm/s LVOT diam:     1.55 cm     LV E/e' lateral: 39.1 LV SV:         49 LV SV Index:   34 LVOT Area:     1.89 cm  LV Volumes (MOD) LV vol d, MOD A2C: 56.2 ml LV vol d, MOD A4C: 62.1 ml LV vol s, MOD A2C: 23.6 ml LV vol s, MOD A4C: 27.0 ml LV SV MOD A2C:     32.6 ml LV SV MOD A4C:     62.1 ml LV SV MOD BP:      35.7 ml RIGHT VENTRICLE RV Basal diam:  3.30 cm RV Mid diam:    1.70 cm RV S prime:     14.30 cm/s TAPSE (M-mode): 1.2 cm LEFT ATRIUM  Index        RIGHT ATRIUM           Index LA Vol (A2C):   126.0 ml 86.64 ml/m  RA Area:     13.30 cm LA Vol (A4C):   86.6 ml  59.55 ml/m  RA Volume:   26.80 ml  18.43 ml/m LA Biplane Vol: 109.0 ml 74.95 ml/m  AORTIC VALVE                     PULMONIC VALVE AV Area (Vmax):    0.77 cm      PV Vmax:       0.90 m/s AV Area (Vmean):   0.83 cm      PV Vmean:      63.300 cm/s AV Area (VTI):     0.77 cm      PV VTI:        0.171 m AV Vmax:           391.25 cm/s   PV Peak grad:  3.2  mmHg AV Vmean:          244.000 cm/s  PV Mean grad:  2.0 mmHg AV VTI:            0.643 m AV Peak Grad:      61.2 mmHg AV Mean Grad:      26.0 mmHg LVOT Vmax:         160.33 cm/s LVOT Vmean:        106.867 cm/s LVOT VTI:          0.261 m LVOT/AV VTI ratio: 0.41  AORTA Ao Root diam: 3.30 cm Ao Asc diam:  3.00 cm MITRAL VALVE                TRICUSPID VALVE MV Area (PHT): 3.45 cm     TR Peak grad:   40.2 mmHg MV Area VTI:   1.29 cm     TR Vmax:        317.00 cm/s MV Peak grad:  12.4 mmHg MV Mean grad:  7.0 mmHg     SHUNTS MV Vmax:       1.76 m/s     Systemic VTI:  0.26 m MV Vmean:      120.0 cm/s   Systemic Diam: 1.55 cm MV Decel Time: 220 msec MV E velocity: 140.50 cm/s MV A velocity: 172.50 cm/s MV E/A ratio:  0.81 Dorris Carnes MD Electronically signed by Dorris Carnes MD Signature Date/Time: 03/18/2022/2:12:48 PM    Final       Subjective: Patient seen and examined at the bedside this morning.  Hemodynamically stable for discharge today.  I called the daughter and updated about the discharge plan  Discharge Exam: Vitals:   03/22/22 0500 03/22/22 0902  BP: 133/69 (!) 149/60  Pulse: 97   Resp: 14   Temp: 97.9 F (36.6 C)   SpO2: 92%    Vitals:   03/21/22 1900 03/22/22 0500 03/22/22 0902 03/22/22 1059  BP: (!) 149/66 133/69 (!) 149/60   Pulse: 96 97    Resp: 16 14    Temp: 97.9 F (36.6 C) 97.9 F (36.6 C)    TempSrc: Oral Oral    SpO2: 96% 92%    Weight:  64 kg  50.3 kg  Height:        General: Pt is alert, awake, not in acute distress Cardiovascular: RRR, S1/S2 +, no rubs, no gallops Respiratory: CTA bilaterally, no wheezing, bibasilar  crackles Abdominal: Soft, NT, ND, bowel sounds + Extremities: no edema, no cyanosis    The results of significant diagnostics from this hospitalization (including imaging, microbiology, ancillary and laboratory) are listed below for reference.     Microbiology: Recent Results (from the past 240 hour(s))  MRSA Next Gen by PCR, Nasal     Status:  None   Collection Time: 03/18/22  1:27 AM   Specimen: Nasal Mucosa; Nasal Swab  Result Value Ref Range Status   MRSA by PCR Next Gen NOT DETECTED NOT DETECTED Final    Comment: (NOTE) The GeneXpert MRSA Assay (FDA approved for NASAL specimens only), is one component of a comprehensive MRSA colonization surveillance program. It is not intended to diagnose MRSA infection nor to guide or monitor treatment for MRSA infections. Test performance is not FDA approved in patients less than 93 years old. Performed at [facility], [place] [address] [place], [place] [address]   Culture, blood (routine x 2) Call MD if unable to obtain prior to antibiotics being given     Status: None (Preliminary result)   Collection Time: 03/18/22  1:43 AM   Specimen: BLOOD LEFT ARM  Result Value Ref Range Status   Specimen Description BLOOD LEFT ARM  Final   Special Requests   Final    BOTTLES DRAWN AEROBIC AND ANAEROBIC Blood Culture adequate volume   Culture   Final    NO GROWTH 4 DAYS Performed at [facility], [place] [address], [place], [place] [address]    Report Status PENDING  Incomplete  Culture, blood (routine x 2) Call MD if unable to obtain prior to antibiotics being given     Status: None (Preliminary result)   Collection Time: 03/18/22  1:47 AM   Specimen: BLOOD  Result Value Ref Range Status   Specimen Description BLOOD RIGHT ANTECUBITAL  Final   Special Requests   Final    BOTTLES DRAWN AEROBIC AND ANAEROBIC Blood Culture results may not be optimal due to an excessive volume of blood received in culture bottles   Culture   Final    NO GROWTH 4 DAYS Performed at [facility], [place] [address], [place], [place] [address]    Report Status PENDING  Incomplete     Labs: BNP (last 3 results) Recent Labs    03/28/21 1356 03/17/22 1622  BNP 2,011.1* 9,426.2*   Basic Metabolic Panel: Recent Labs  Lab 03/18/22 0147 03/19/22 0739 03/20/22 0214 03/21/22 0043 03/22/22 0151   NA 132* 134* 135 138 138  K 3.9 4.5 4.6 4.6 4.4  CL 99 100 103 109 106  CO2 _0 GLUCOSE 107* 115* 132* 138* 113*  BUN 31* 43* 55* 59* 58*  CREATININE 1.20* 1.78* 2.00* 1.64* 1.56*  CALCIUM 9.0 9.1 8.9 8.6* 8.8*  MG 1.9  --   --   --   --   PHOS 3.3  --   --   --   --    Liver Function Tests: Recent Labs  Lab 03/17/22 1621 03/18/22 0147 03/18/22 1853  AST _1 ALT _2 ALKPHOS 54 54  54 54  BILITOT 1.0 0.6  0.8 0.3  PROT 6.9 6.7  6.7 6.7  ALBUMIN 3.6 3.4*  3.4* 3.3*   No results for input(s): LIPASE, AMYLASE in the last 168 hours. No results for input(s): AMMONIA in the last 168 hours. CBC: Recent Labs  Lab 03/17/22 1621 03/18/22 0147  WBC 10.9* 9.7  NEUTROABS 9.7*  --   HGB 11.0* 11.5*  HCT 32.8* 35.8*  MCV 107.9* 110.8*  PLT 215 244   Cardiac Enzymes: Recent Labs  Lab 03/18/22 0147  CKTOTAL 56   BNP: Invalid input(s): POCBNP CBG: No results for input(s): GLUCAP in the last 168 hours. D-Dimer No results for input(s): DDIMER in the last 72 hours. Hgb A1c No results for input(s): HGBA1C in the last 72 hours. Lipid Profile No results for input(s): CHOL, HDL, LDLCALC, TRIG, CHOLHDL, LDLDIRECT in the last 72 hours. Thyroid function studies No results for input(s): TSH, T4TOTAL, T3FREE, THYROIDAB in the last 72 hours.  Invalid input(s): FREET3 Anemia work up No results for input(s): VITAMINB12, FOLATE, FERRITIN, TIBC, IRON, RETICCTPCT in the last 72 hours. Urinalysis    Component Value Date/Time   COLORURINE YELLOW 03/30/2021 1416   APPEARANCEUR CLEAR 03/30/2021 1416   LABSPEC 1.013 03/30/2021 1416   PHURINE 5.0 03/30/2021 1416   GLUCOSEU NEGATIVE 03/30/2021 1416   GLUCOSEU NEGATIVE 01/10/2021 0958   HGBUR NEGATIVE 03/30/2021 1416   BILIRUBINUR NEGATIVE 03/30/2021 1416   BILIRUBINUR Neg 09/03/2017 1529   KETONESUR NEGATIVE 03/30/2021 1416   PROTEINUR NEGATIVE 03/30/2021 1416   UROBILINOGEN 0.2 01/10/2021 0958    NITRITE NEGATIVE 03/30/2021 1416   LEUKOCYTESUR NEGATIVE 03/30/2021 1416   Sepsis Labs Invalid input(s): PROCALCITONIN,  WBC,  LACTICIDVEN Microbiology Recent Results (from the past 240 hour(s))  MRSA Next Gen by PCR, Nasal     Status: None   Collection Time: 03/18/22  1:27 AM   Specimen: Nasal Mucosa; Nasal Swab  Result Value Ref Range Status   MRSA by PCR Next Gen NOT DETECTED NOT DETECTED Final    Comment: (NOTE) The GeneXpert MRSA Assay (FDA approved for NASAL specimens only), is one component of a comprehensive MRSA colonization surveillance program. It is not intended to diagnose MRSA infection nor to guide or monitor treatment for MRSA infections. Test performance is not FDA approved in patients less than 61 years old. Performed at [facility], [place] [address], [place], [place] [address]   Culture, blood (routine x 2) Call MD if unable to obtain prior to antibiotics being given     Status: None (Preliminary result)   Collection Time: 03/18/22  1:43 AM   Specimen: BLOOD LEFT ARM  Result Value Ref Range Status   Specimen Description BLOOD LEFT ARM  Final   Special Requests   Final    BOTTLES DRAWN AEROBIC AND ANAEROBIC Blood Culture adequate volume   Culture   Final    NO GROWTH 4 DAYS Performed at [facility], [place] [address], [place], [place] [address]    Report Status PENDING  Incomplete  Culture, blood (routine x 2) Call MD if unable to obtain prior to antibiotics being given     Status: None (Preliminary result)   Collection Time: 03/18/22  1:47 AM   Specimen: BLOOD  Result Value Ref Range Status   Specimen Description BLOOD RIGHT ANTECUBITAL  Final   Special Requests   Final    BOTTLES DRAWN AEROBIC AND ANAEROBIC Blood Culture results may not be optimal due to an excessive volume of blood received in culture bottles   Culture   Final    NO GROWTH 4 DAYS Performed at [facility], [place] [address] [place], [place] [address]    Report Status  PENDING  Incomplete    Please note: You were cared  for by a hospitalist during your hospital stay. Once you are discharged, your primary care physician will handle any further medical issues. Please note that NO REFILLS for any discharge medications will be authorized once you are discharged, as it is imperative that you return to your primary care physician (or establish a relationship with a primary care physician if you do not have one) for your post hospital discharge needs so that they can reassess your need for medications and monitor your lab values.    Time coordinating discharge: 40 minutes  SIGNED:   Shelly Coss, MD  [facility] 03/22/2022, 11:01 AM Pager [phone]  If 7PM-7AM, please contact night-coverage [email or web address] Password [email or web address]

## 2022-03-22 NOTE — Progress Notes (Signed)
NAME:  Paula Ward, MRN:  [id number], DOB:  December 31, 1935, LOS: 4 ADMISSION DATE:  03/17/2022, CONSULTATION DATE: 03/18/2022 REFERRING MD: Dr. 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   Breast cancer ([place]) 1989   Left   Candida esophagitis (HCC)    Cataract    Chronic kidney disease    Chronic mild renal insuffiency   CREST syndrome (HCC)    GERD (gastroesophageal reflux disease)    w/ HH   Interstitial lung disease (HCC)    Nodule of kidney    Pericardial effusion    PONV (postoperative nausea and vomiting)    Pulmonary embolus ([place]) 2003   Pulmonary hypertension ([place])    followed by Dr Gaynell Face at [facility], now Dr. Maryjean Ka visit end 2'17.   Rectal incontinence    Renal cell carcinoma (HCC)    Scleroderma (HCC)    Tubular adenoma of colon    Uterine polyp    Significant Hospital Events: Including procedures, antibiotic start and stop dates in addition to other pertinent events   Echocardiogram-no evidence of tamponade, large effusion CT scan of the chest IMPRESSION: 1. No evidence for pulmonary embolism. 2. Large pericardial effusion. 3. Mild cardiomegaly. 4. Small bilateral  pleural effusions. 5. Bilateral lower lobe airspace disease compatible with infection. 6. Ground-glass opacities in both lungs favored as edema, although infection is not excluded. Some ground-glass areas are nodular. 7. A follow-up chest CT is recommended in 3 months to re-evaluate and confirm resolution.  Interim History / Subjective:  Feeling better, still some minimal cough Ambulating to the restroom on room air without difficulty Denies any chest pain  Note improvement in serum creatinine, now back on her home dose furosemide 20 mg daily  Objective   Blood pressure (!) 149/60, pulse 97, temperature 97.9 F (36.6 C), temperature source Oral, resp. rate 14, height _0  (1.6 m), weight 64 kg, SpO2 92 %.        Intake/Output Summary (Last 24 hours) at 03/22/2022 1023 Last data filed at 03/22/2022 0400 Gross per 24 hour  Intake --  Output 2500 ml  Net -2500 ml   Filed Weights   03/20/22 0200 03/21/22 0650 03/22/22 0500  Weight: 48.8 kg 65.4 kg 64 kg    Examination: General: Comfortable thin elderly woman sitting up at bedside HENT: Oropharynx clear, strong voice, no stridor lungs: Bibasilar inspiratory crackles, no wheezes Cardiovascular: Regular, distant, no murmur Abdomen: Nondistended Extremities: No edema Neuro: Awake, alert, interacting appropriately, follows commands.  Resolved Hospital Problem list     Assessment & Plan:   Pulmonary arterial hypertension -On maintenance macitentan -Plan to continue 10 mg daily  History of crest syndrome Scleroderma Interstitial lung disease Acute CAP versus pneumonitis/flare  from her ILD -Continue oxygen supplementation at night -Methylprednisolone 2 mg was on one of her preadmission medication list but she indicates that she is not on chronic corticosteroids. -Treating possible bibasilar CAP with doxycycline, Omnicef, prednisone 20 mg x 5 days then to off. -Will arrange for repeat CT scan of the chest in 4 to 6 weeks to follow  for resolution infiltrates, pneumonia/pneumonitis.  If infiltrates persistent then may need to consider ILD flare, more prolonged course of corticosteroids.  She has clinically improved with antibiotics.  Follow-up with Dr. Erin Fulling after the CT chest to review and decide next steps  Decompensated heart failure Diastolic heart failure -Bilateral pleural effusions -Diuretics initially held, now back on her usual home dose Lasix 20 mg daily  Acute kidney injury, improving -Follow urine output, BMP on current diuretic regimen  Large pericardial effusion -No evidence of tamponade on echo  Moderate aortic stenosis Moderate mitral valve disease with calcified annulus -For possible cardiac cath in the near future  History of GI bleeding due to AVMs -On PPIs  IVC filter in place for pulmonary embolism -Most recent CTA negative for pulmonary embolism  History of renal cell cancer   Best Practice (right click and "Reselect all SmartList Selections" daily)   Diet/type: Regular consistency (see orders) DVT prophylaxis: SCD GI prophylaxis: PPI Lines: N/A Foley:  N/A Code Status:  full code Last date of multidisciplinary goals of care discussion [pending]  Labs   CBC: Recent Labs  Lab 03/17/22 1621 03/18/22 0147  WBC 10.9* 9.7  NEUTROABS 9.7*  --   HGB 11.0* 11.5*  HCT 32.8* 35.8*  MCV 107.9* 110.8*  PLT 215 198    Basic Metabolic Panel: Recent Labs  Lab 03/18/22 0147 03/19/22 0739 03/20/22 0214 03/21/22 0043 03/22/22 0151  NA 132* 134* 135 138 138  K 3.9 4.5 4.6 4.6 4.4  CL 99 100 103 109 106  CO2 _0 GLUCOSE 107* 115* 132* 138* 113*  BUN 31* 43* 55* 59* 58*  CREATININE 1.20* 1.78* 2.00* 1.64* 1.56*  CALCIUM 9.0 9.1 8.9 8.6* 8.8*  MG 1.9  --   --   --   --   PHOS 3.3  --   --   --   --    GFR: Estimated Creatinine Clearance: 24.6 mL/min (A) (by C-G formula based on SCr of 1.56 mg/dL (H)). Recent Labs  Lab 03/17/22 1621 03/18/22 0147  PROCALCITON   --  0.52  WBC 10.9* 9.7    Liver Function Tests: Recent Labs  Lab 03/17/22 1621 03/18/22 0147 03/18/22 1853  AST _1 ALT _2 ALKPHOS 54 54  54 54  BILITOT 1.0 0.6  0.8 0.3  PROT 6.9 6.7  6.7 6.7  ALBUMIN 3.6 3.4*  3.4* 3.3*    ABG    Component Value Date/Time   HCO3 26.1 03/29/2021 0534   TCO2 23 06/15/2012 1158   O2SAT 97.1 03/29/2021 0534      Cardiac Enzymes: Recent Labs  Lab 03/18/22 0147  CKTOTAL 56    PCCM will sign off.  Please call if we can assist in any way.  Baltazar Apo, MD, PhD 03/22/2022, 10:23 AM [facility] [phone] or if no answer before 7:00PM call [phone] For any issues after 7:00PM please call eLink [phone]

## 2022-03-22 NOTE — Progress Notes (Signed)
Progress Note  Patient Name: Paula Ward Date of Encounter: 03/22/2022  [facility] Cardiologist: Kirk Ruths, MD   Subjective   Good diuresis. Feels much better. Some swallowing/regurgitation issues (chronic). No chest pain.  Inpatient Medications    Scheduled Meds:  cefdinir  300 mg Oral Q12H   diltiazem  120 mg Oral Daily   doxycycline  100 mg Oral Q12H   feeding supplement  1 Container Oral TID BM   furosemide  20 mg Oral Daily   macitentan  10 mg Oral Daily   magic mouthwash  10 mL Oral QID   montelukast  10 mg Oral QHS   multivitamin with minerals  1 tablet Oral Daily   pantoprazole  40 mg Oral BID AC   predniSONE  20 mg Oral Q breakfast   sodium chloride flush  3 mL Intravenous Q12H   spironolactone  12.5 mg Oral Daily   Continuous Infusions:  sodium chloride     PRN Meds: sodium chloride, acetaminophen **OR** acetaminophen, albuterol, ALPRAZolam, HYDROcodone-acetaminophen, sodium chloride flush, sucralfate   Vital Signs    Vitals:   03/21/22 1444 03/21/22 1900 03/22/22 0500 03/22/22 0902  BP: (!) 139/58 (!) 149/66 133/69 (!) 149/60  Pulse: 100 96 97   Resp: _0 Temp: 97.8 F (36.6 C) 97.9 F (36.6 C) 97.9 F (36.6 C)   TempSrc: Oral Oral Oral   SpO2: 93% 96% 92%   Weight:   64 kg   Height:        Intake/Output Summary (Last 24 hours) at 03/22/2022 1038 Last data filed at 03/22/2022 0400 Gross per 24 hour  Intake --  Output 2500 ml  Net -2500 ml      03/22/2022    5:00 AM 03/21/2022    6:50 AM 03/20/2022    2:00 AM  Last 3 Weights  Weight (lbs) 141 lb 1.5 oz 144 lb 1.6 oz 107 lb 8 oz  Weight (kg) 64 kg 65.363 kg 48.762 kg      Telemetry    NSR - Personally Reviewed  ECG    No new tracing - Personally Reviewed  Physical Exam  Appears well.  GEN: No acute distress.  Cutaneous telangiectasiae, widespread Neck: No JVD Cardiac: RRR, 2/6 mid-peaking Ao ejection no diastolic murmurs, rubs, or gallops.  Respiratory: Clear to  auscultation bilaterally. GI: Soft, nontender, non-distended  MS: No edema; No deformity. Neuro:  Nonfocal  Psych: Normal affect   Labs    High Sensitivity Troponin:   Recent Labs  Lab 03/17/22 1623 03/17/22 1811  TROPONINIHS 30* 26*     Chemistry Recent Labs  Lab 03/17/22 1621 03/18/22 0147 03/18/22 1853 03/19/22 0739 03/20/22 0214 03/21/22 0043 03/22/22 0151  NA 133* 132*  --    < > 135 138 138  K 4.0 3.9  --    < > 4.6 4.6 4.4  CL 98 99  --    < > 103 109 106  CO2 24 23  --    < > _1 GLUCOSE 165* 107*  --    < > 132* 138* 113*  BUN 33* 31*  --    < > 55* 59* 58*  CREATININE 1.24* 1.20*  --    < > 2.00* 1.64* 1.56*  CALCIUM 9.0 9.0  --    < > 8.9 8.6* 8.8*  MG  --  1.9  --   --   --   --   --  PROT 6.9 6.7  6.7 6.7  --   --   --   --   ALBUMIN 3.6 3.4*  3.4* 3.3*  --   --   --   --   AST _0 --   --   --   --   ALT _1 --   --   --   --   ALKPHOS 54 54  54 54  --   --   --   --   BILITOT 1.0 0.6  0.8 0.3  --   --   --   --   GFRNONAA 43* 45*  --    < > 24* 31* 33*  ANIONGAP 11 10  --    < > _2 < > = values in this interval not displayed.    Lipids No results for input(s): CHOL, TRIG, HDL, LABVLDL, LDLCALC, CHOLHDL in the last 168 hours.  Hematology Recent Labs  Lab 03/17/22 1621 03/18/22 0147  WBC 10.9* 9.7  RBC 3.04* 3.23*  HGB 11.0* 11.5*  HCT 32.8* 35.8*  MCV 107.9* 110.8*  MCH 36.2* 35.6*  MCHC 33.5 32.1  RDW 13.5 13.5  PLT 215 244   Thyroid No results for input(s): TSH, FREET4 in the last 168 hours.  BNP Recent Labs  Lab 03/17/22 1622  BNP 2,025.3*    DDimer No results for input(s): DDIMER in the last 168 hours.   Radiology    No results found.  Cardiac Studies   Echo from 03/18/2022:    1. Left ventricular ejection fraction, by estimation, is 55 to 60%. The  left ventricle has normal function. The left ventricle has no regional  wall motion abnormalities. There is moderate left ventricular  hypertrophy.  Left ventricular diastolic  parameters are consistent with Grade I diastolic dysfunction (impaired  relaxation). Elevated left atrial pressure.   2. Right ventricular systolic function is normal. The right ventricular  size is normal.   3. Left atrial size was severely dilated.   4. Large pericardial effusion surrounds heart Most prominent along  lateral side of LV 24 mm in maximal dimension. There is no evidence by  echo criteria for hemodynamic compromise. Mitral inflow shows no  signficant respiratory variation, there is no RV  collapse, IVC is normal size. . Large pleural effusion.   5. Signficant mitral annular calcification. Mitral leaflets are thickened  with moderately restricted motion. Peak and mean gradients through the  valve are 12 and 7 mm Hg respectively. (HR =91 bpm). The mitral valve is  abnormal. Trivial mitral valve  regurgitation. No evidence of mitral stenosis. Moderate to severe mitral  annular calcification.   6. AV is thickened, calcified with restricted motion. Peak and mean  gradients through the valve are 45 and 25 mm Hg respectively Dimensionless  index is 0.26. Overall consistent with moderate AS. Compared to echo  report from Jan 2023 [facility]), no  significant change. . The aortic valve is tricuspid. Aortic valve  regurgitation is not visualized.    CTA chest 03/17/22:   1. No evidence for pulmonary embolism. 2. Large pericardial effusion. 3. Mild cardiomegaly. 4. Small bilateral pleural effusions. 5. Bilateral lower lobe airspace disease compatible with infection. 6. Ground-glass opacities in both lungs favored as edema, although infection is not excluded. Some ground-glass areas are nodular. 7. A follow-up chest CT is recommended in 3 months to re-evaluate and confirm resolution.  Patient Profile  86 y.o. female with a hx of pulmonary arterial hypertension secondary to limited scleroderma, with possible contributions of ILD (group  3), pericardial effusion, moderate aortic stenosis (group 2 PAH), PE, GIB 2nd AVMs s/p IVC, CREST syndrome, nl cors 2003, anemia, breast CA, renal cell CA, who was seen 03/17/2022 for the evaluation of pericardial effusion and elevated troponin.  Assessment & Plan     [facility] will sign off.   Medication Recommendations:  resume all preadmission cardiac meds (including diltiazem SR 120 mg daily, furosemide 20 mg daily, spironolactone 25 mg daily, Opsumit 10 mg daily) Other recommendations (labs, testing, etc):  BMET and BNP at f/u visit with [facility] 06/15 Follow up as an outpatient:  Dr. Burt Knack for TAVR evaluation 04/11/2022  For questions or updates, please contact [facility] Please consult [email or web address] for contact info under        Signed, Sanda Klein, MD  03/22/2022, 10:38 AM

## 2022-03-22 NOTE — Telephone Encounter (Signed)
Patient seen while admitted to the hospital  She needs to be set up for a CT chest, no contrast, in 4-6 weeks for pneumonitis Needs OV with Dr Erin Fulling after to review the CT chest   Thank you

## 2022-03-23 LAB — CULTURE, BLOOD (ROUTINE X 2)
Culture: NO GROWTH
Culture: NO GROWTH
Special Requests: ADEQUATE

## 2022-03-24 ENCOUNTER — Telehealth: Payer: Self-pay | Admitting: Internal Medicine

## 2022-03-25 NOTE — Telephone Encounter (Signed)
Refill was sent in earlier this morning

## 2022-03-25 NOTE — Telephone Encounter (Signed)
Patient is completely out of this medication - can another physician ok this refill in Dr. Charlynne Cousins absense

## 2022-03-25 NOTE — Telephone Encounter (Signed)
Order placed for CT scan wo contrast.   Needs follow up with Dr Erin Fulling after scan.   Called and left voicemail for patient what was ordered and that she needs to follow up with Dr Erin Fulling after scan. Nothing further needed.

## 2022-03-26 ENCOUNTER — Telehealth: Payer: Self-pay

## 2022-03-26 NOTE — Telephone Encounter (Signed)
Transition Care Management Follow-up Telephone Call Date of discharge and from where: 03/22/22 / [facility] How have you been since you were released from the hospital? "I've been nervous due to medication prednisone but I completed on yesterday." Any questions or concerns? Yes.  Unable to get medication Lasix approved. Patient states she called Dr. Nathanial Millman office on yesterday 03/25/22 requesting authorization for medication. Discussed with patient per chart review Dr. Nathanial Millman office called in prescription. Advised patient to follow up with her local pharmacy today to determine if prescription is available.  Items Reviewed: Did the pt receive and understand the discharge instructions provided? Yes  Medications obtained and verified?  Patient currently awaiting refill of Lasix Other?  N/a Any new allergies since your discharge? No  Dietary orders reviewed? Yes Do you have support at home? Yes   Home Care and Equipment/Supplies: Were home health services ordered? no If so, what is the name of the agency? N/A  Has the agency set up a time to come to the patient's home? not applicable Were any new equipment or medical supplies ordered?  No What is the name of the medical supply agency?  Were you able to get the supplies/equipment? not applicable Do you have any questions related to the use of the equipment or supplies? No  Functional Questionnaire: (I = Independent and D = Dependent) ADLs: I  Bathing/Dressing- I  Meal Prep- I  Eating- I  Maintaining continence- I  Transferring/Ambulation- I  Managing Meds- I  Follow up appointments reviewed:  PCP Hospital f/u appt confirmed? No  Scheduled to see  Specialist Hospital f/u appt confirmed? Yes  Scheduled to see Dr. Caron Presume on 04/03/22 @ 8:00 am and Dr. Sherren Mocha on 04/08/22 at 11:20am. Are transportation arrangements needed? Yes  If their condition worsens, is the pt aware to call PCP or go to the Emergency Dept.?  Yes Was the patient provided with contact information for the PCP's office or ED? Yes Was to pt encouraged to call back with questions or concerns? Yes  Quinn Plowman RN,BSN,CCM RN Case Manager [phone]

## 2022-03-31 ENCOUNTER — Telehealth: Payer: Self-pay | Admitting: Internal Medicine

## 2022-03-31 ENCOUNTER — Encounter: Payer: Self-pay | Admitting: Internal Medicine

## 2022-03-31 ENCOUNTER — Telehealth: Payer: Self-pay | Admitting: Physician Assistant

## 2022-03-31 ENCOUNTER — Ambulatory Visit (INDEPENDENT_AMBULATORY_CARE_PROVIDER_SITE_OTHER): Payer: Medicare PPO | Admitting: Internal Medicine

## 2022-03-31 VITALS — BP 102/70 | HR 85 | Resp 18 | Ht 63.0 in | Wt 105.0 lb

## 2022-03-31 DIAGNOSIS — Z86711 Personal history of pulmonary embolism: Secondary | ICD-10-CM | POA: Diagnosis not present

## 2022-03-31 DIAGNOSIS — D638 Anemia in other chronic diseases classified elsewhere: Secondary | ICD-10-CM | POA: Diagnosis not present

## 2022-03-31 DIAGNOSIS — N179 Acute kidney failure, unspecified: Secondary | ICD-10-CM | POA: Diagnosis not present

## 2022-03-31 DIAGNOSIS — N1832 Chronic kidney disease, stage 3b: Secondary | ICD-10-CM

## 2022-03-31 DIAGNOSIS — J189 Pneumonia, unspecified organism: Secondary | ICD-10-CM | POA: Diagnosis not present

## 2022-03-31 DIAGNOSIS — E43 Unspecified severe protein-calorie malnutrition: Secondary | ICD-10-CM

## 2022-03-31 DIAGNOSIS — J849 Interstitial pulmonary disease, unspecified: Secondary | ICD-10-CM | POA: Diagnosis not present

## 2022-03-31 DIAGNOSIS — I272 Pulmonary hypertension, unspecified: Secondary | ICD-10-CM

## 2022-03-31 LAB — COMPREHENSIVE METABOLIC PANEL
ALT: 12 U/L (ref 0–35)
AST: 18 U/L (ref 0–37)
Albumin: 3.8 g/dL (ref 3.5–5.2)
Alkaline Phosphatase: 56 U/L (ref 39–117)
BUN: 39 mg/dL — ABNORMAL HIGH (ref 6–23)
CO2: 30 mEq/L (ref 19–32)
Calcium: 9.5 mg/dL (ref 8.4–10.5)
Chloride: 99 mEq/L (ref 96–112)
Creatinine, Ser: 1.33 mg/dL — ABNORMAL HIGH (ref 0.40–1.20)
GFR: 36.97 mL/min — ABNORMAL LOW (ref >60.00)
Glucose, Bld: 96 mg/dL (ref 70–99)
Potassium: 4.4 mEq/L (ref 3.5–5.1)
Sodium: 137 mEq/L (ref 135–145)
Total Bilirubin: 0.6 mg/dL (ref 0.2–1.2)
Total Protein: 6.7 g/dL (ref 6.0–8.3)

## 2022-03-31 MED ORDER — CHLORHEXIDINE GLUCONATE 0.12 % MT SOLN: 15.0000 mL | Freq: Two times a day (BID) | OROMUCOSAL | 11 refills | Status: DC | PRN

## 2022-03-31 NOTE — Telephone Encounter (Signed)
Spoke with the patient and she stated that she is not having shortness of breath, but she is having tingling in her left arm that radiates down to her left leg. She stated that she just doesn't feel well. She is requesting a home health nurse come out and check her out but she was told that she had to go thru our office in order to initiate.

## 2022-03-31 NOTE — Telephone Encounter (Signed)
FU as scheduled; ER if symptoms worsen prior to then  Kirk Ruths    Patient informed and voices understanding.

## 2022-03-31 NOTE — Telephone Encounter (Signed)
Home health nurse called while visiting patient- patient is not feeling well and complains of shortness of breath - Home health nurse wants Dr. Charlynne Cousins cma to just call and check on her.

## 2022-03-31 NOTE — Patient Instructions (Signed)
We will check the labs today.    

## 2022-03-31 NOTE — Progress Notes (Signed)
Subjective:   Patient ID: Paula Ward, female    DOB: 18-Jul-1936, 86 y.o.   MRN: [id number]  HPI The patient is an 86 YO female coming in for hospital follow up and having some concerns.   PMH, [facility], social history reviewed and updated  Review of Systems  Constitutional:  Positive for activity change, appetite change and fatigue.  HENT: Negative.    Eyes: Negative.   Respiratory:  Positive for chest tightness and shortness of breath. Negative for cough.   Cardiovascular:  Negative for chest pain, palpitations and leg swelling.  Gastrointestinal:  Negative for abdominal distention, abdominal pain, constipation, diarrhea, nausea and vomiting.  Musculoskeletal: Negative.   Skin: Negative.   Neurological: Negative.   Psychiatric/Behavioral: Negative.      Objective:  Physical Exam Constitutional:      Appearance: She is well-developed.     Comments: Chronically ill appearing  HENT:     Head: Normocephalic and atraumatic.  Cardiovascular:     Rate and Rhythm: Normal rate and regular rhythm.     Heart sounds: Murmur heard.  Pulmonary:     Effort: Pulmonary effort is normal. No respiratory distress.     Breath sounds: No wheezing or rales.  Abdominal:     General: Bowel sounds are normal. There is no distension.     Palpations: Abdomen is soft.     Tenderness: There is no abdominal tenderness. There is no rebound.  Musculoskeletal:     Cervical back: Normal range of motion.  Skin:    General: Skin is warm and dry.  Neurological:     Mental Status: She is alert and oriented to person, place, and time.     Coordination: Coordination normal.     Vitals:   03/31/22 1319  BP: 102/70  Pulse: 85  Resp: 18  SpO2: 96%  Weight: 105 lb (47.6 kg)  Height: 5' 3" (1.6 m)    Assessment & Plan:  Visit time 25 minutes in face to face communication with patient and coordination of care, additional 20 minutes spent in record review, coordination or care, ordering tests,  communicating/referring to other healthcare professionals, documenting in medical records all on the same day of the visit for total time 45 minutes spent on the visit.

## 2022-03-31 NOTE — Telephone Encounter (Signed)
I spoke w the patient.  She wanted to report that she "is just not bouncing back"  her left arm has been sore and tingling sometimes down to fingers.  It is sore w movement and pressing on it.    Left leg - calf to toes is tingling this just started today.  Leg is sore at the calf when she presses on it.  No cramping.   She is up and moving around a little bit.  She is fatigued.  She has not slept well at all the last 3 nights.  She is trying to eat and drink enough.  Moving bowels and making urine.  Wears her home O2 every night and during the day as needed.  Pulse ox is "good" per patient.  [employer] called her PCP to try to help facilitate a sooner appointment.  She will call EMS if the pressure returns in her chest.  She is taking spironolactone and lasix.   Cardiology follow up 6/15 PCP appt 6/19 Dr. Burt Knack for TAVR consult 6/20.  The patient is aware we will forward her message to Dr. Stanford Breed and his nurse and will call back if they have any recommendations for her.

## 2022-03-31 NOTE — Telephone Encounter (Signed)
Patient is calling to talk with Caron Presume or nurse. Patient mentioned that she was in the hospital and needs to talk with her before her appt on 04/03/22

## 2022-03-31 NOTE — Telephone Encounter (Signed)
We cannot order hh services without face to face. Okay to schedule acute visit with anyone or urgent care if no available anywhere.

## 2022-04-02 ENCOUNTER — Ambulatory Visit (INDEPENDENT_AMBULATORY_CARE_PROVIDER_SITE_OTHER): Payer: Medicare PPO

## 2022-04-02 DIAGNOSIS — Z0000 Encounter for general adult medical examination without abnormal findings: Secondary | ICD-10-CM

## 2022-04-02 NOTE — Progress Notes (Signed)
Cardiology Office Note:    Date:  04/03/2022   ID:  Paula Ward, DOB [birth year], MRN [id number]  PCP:  Paula Koch, MD [place] Cardiologist: Paula Ruths, MD    Reason for visit: Hospital follow-up  History of Present Illness:    Paula Ward is a 86 y.o. 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  She presented to the hospital 03/17/22 with substernal chest pain and worsening shortness of breath.  She was treated for acute on chronic respiratory failure secondary to pneumonia, ILD, pericardial effusion and chronic diastolic heart failure.  She is on room air on discharge.  She had acute on chronic kidney disease thought due to contrast insult.  Baseline creatinine 1.2, creatinine peaked at 2.  Patient called office on June 12 and stated she is not bouncing back.  She complained about arm and leg soreness and tingling and fatigue.  She saw her PCP this past Monday who thought her soreness/tingling was secondary to bed position in the hospital.  The symptoms have resolved.  Today, she states her chest pressure resolved prior to discharge.  She did not need oxygen during the day and continues oxygen only at night.  She only complains about insomnia.  She is prescribed Xanax 0.25 mg and she takes 1/2 tablet occasionally which works well for her.  She tries to avoid taking it too often.  She has dyspnea with moderate exertion.  She denies shortness of breath with walking around the house and stores.  Her shortness of breath improved with 2 months of pulmonary rehab.  She was not wearing any oxygen while walking the track and doing the bike at rehab.  She denies lightheadedness and syncope.  From a heart failure standpoint, she  denies PND and orthopnea.  Her weight has been stable at 104 pounds since hospital discharge.  She has intermittent bloating.  No lower extremity edema.    Past Medical History:  Diagnosis Date   Anemia    Angiodysplasia of stomach and duodenum    Arthus phenomenon    AVM (arteriovenous malformation)    Blood transfusion without reported diagnosis    last 4 units 12-22-15, Iron infusion x2 last -01-07-16,01-14-16.   Breast cancer ([place]) 1989   Left   Candida esophagitis (HCC)    Cataract    Chronic kidney disease    Chronic mild renal insuffiency   CREST syndrome (HCC)    GERD (gastroesophageal reflux disease)    w/ HH   Interstitial lung disease (HCC)    Nodule of kidney    Pericardial effusion    PONV (postoperative nausea and vomiting)    Pulmonary embolus ([place]) 2003   Pulmonary hypertension ([place])    followed by Dr Paula Ward at [facility], now Dr. Maryjean Ward visit end 2'17.   Rectal incontinence    Renal cell carcinoma (HCC)    Scleroderma (HCC)    Tubular adenoma of colon    Uterine polyp     Past Surgical History:  Procedure Laterality Date   APPENDECTOMY  1962   BREAST LUMPECTOMY  1989   left   CATARACT EXTRACTION, BILATERAL Bilateral 12/2013   COLONOSCOPY WITH PROPOFOL N/A 04/15/2021   Procedure: COLONOSCOPY WITH PROPOFOL;  Surgeon: Milus Banister, MD;  [facility];  Service: Endoscopy;  Laterality: N/A;   ENTEROSCOPY N/A 01/18/2016   Procedure: ENTEROSCOPY;  Surgeon: Mauri Pole, MD;  Location: [facility];  Service: Endoscopy;  Laterality: N/A;   ENTEROSCOPY N/A 05/24/2018   Procedure: ENTEROSCOPY;  Surgeon: Jackquline Denmark, MD;  Location: [facility];  Service: Endoscopy;  Laterality: N/A;   ENTEROSCOPY N/A 03/29/2021   Procedure: ENTEROSCOPY;  Surgeon: Jerene Bears, MD;  Location: [facility];  Service: Gastroenterology;  Laterality: N/A;   ENTEROSCOPY N/A 04/13/2021   Procedure: ENTEROSCOPY;  Surgeon: Yetta Flock, MD;  [facility]  ENDOS[facility];  Service: Gastroenterology;  Laterality: N/A;   ESOPHAGOGASTRODUODENOSCOPY (EGD) WITH PROPOFOL N/A 12/21/2015   Procedure: ESOPHAGOGASTRODUODENOSCOPY (EGD) WITH PROPOFOL;  Surgeon: Irene Shipper, MD;  [facility];  Service: Endoscopy;  Laterality: N/A;   HOT HEMOSTASIS N/A 05/24/2018   Procedure: HOT HEMOSTASIS (ARGON PLASMA COAGULATION/BICAP);  Surgeon: Jackquline Denmark, MD;  Location: [facility];  Service: Endoscopy;  Laterality: N/A;   HOT HEMOSTASIS N/A 03/29/2021   Procedure: HOT HEMOSTASIS (ARGON PLASMA COAGULATION/BICAP);  Surgeon: Jerene Bears, MD;  [facility];  Service: Gastroenterology;  Laterality: N/A;   HOT HEMOSTASIS N/A 04/13/2021   Procedure: HOT HEMOSTASIS (ARGON PLASMA COAGULATION/BICAP);  Surgeon: Yetta Flock, MD;  [facility];  Service: Gastroenterology;  Laterality: N/A;   IR GENERIC HISTORICAL  06/05/2016   IR RADIOLOGIST EVAL & MGMT 06/05/2016 Aletta Edouard, MD [facility] INTERV RAD   IVC Filter     KIDNEY SURGERY     left -"laser surgery by Dr. Kathlene Cote- 5 yrs ago" no removal   SCHLEROTHERAPY  05/24/2018   Procedure: SCHLEROTHERAPY;  Surgeon: Jackquline Denmark, MD;  [facility];  Service: Endoscopy;;   SUBMUCOSAL TATTOO INJECTION  04/13/2021   Procedure: SUBMUCOSAL TATTOO INJECTION;  Surgeon: Yetta Flock, MD;  [facility];  Service: Gastroenterology;;   TONSILLECTOMY     TUBAL LIGATION      Current Medications: Current Meds  Medication Sig   acetaminophen (TYLENOL) 325 MG tablet Take 650 mg by mouth every 6 (six) hours as needed for mild pain.    albuterol (VENTOLIN HFA) 108 (90 Base) MCG/ACT inhaler INHALE 2 PUFFS INTO THE LUNGS EVERY 6 HOURS AS NEEDED FOR WHEEZING OR SHORTNESS OF BREATH (Patient taking differently: Inhale 2 puffs into the lungs every 6 (six) hours as needed for wheezing or shortness of breath.)   ALPRAZolam (XANAX) 0.25 MG tablet Take 0.5 tablets (0.125 mg total) by mouth at bedtime  as needed. (Patient taking differently: Take 0.125 mg by mouth at bedtime as needed for anxiety or sleep.)   chlorhexidine (PERIDEX) 0.12 % solution 15 mLs by Mouth Rinse route 2 (two) times daily as needed (sore mouth).   Cyanocobalamin (VITAMIN B-12 PO) Take 1 tablet by mouth daily. Unsure of dose   diltiazem (CARDIZEM CD) 120 MG 24 hr capsule Take 120 mg by mouth daily.   fluticasone (FLONASE) 50 MCG/ACT nasal spray INSTILL 1 SPRAY INTO EACH NOSTRIL TWICE A DAY IF NEEDED (Patient taking differently: Place 1 spray into both nostrils daily as needed for allergies.)   furosemide (LASIX) 20 MG tablet Take 1 tablet by mouth once daily   guaiFENesin (MUCINEX) 600 MG 12 hr tablet Take 150 mg by mouth daily.   magic mouthwash SOLN Take 10 mLs by mouth 4 (four) times daily as needed for mouth pain.   montelukast (SINGULAIR) 10 MG tablet TAKE 1 TABLET BY MOUTH AT BEDTIME (Patient taking differently: Take 10 mg  by mouth at bedtime as needed (allergies).)   Multiple Vitamins-Iron (MULTIVITAMIN/IRON PO) Take 1 tablet by mouth daily.   Multiple Vitamins-Minerals (ZINC PO) Take 1 tablet by mouth daily. Unsure of dose   OPSUMIT 10 MG TABS Take 10 mg by mouth daily.   OXYGEN Inhale into the lungs. 2 LMP at night and upon exertion   pantoprazole (PROTONIX) 40 MG tablet Take 1 tablet (40 mg total) by mouth 2 (two) times daily before a meal.   Pyridoxine HCl (VITAMIN B6 PO) Take 1 tablet by mouth daily. Unsure of dose   spironolactone (ALDACTONE) 25 MG tablet Take 12.5 mg by mouth daily.   sucralfate (CARAFATE) 1 GM/10ML suspension Take 1 g by mouth every 6 (six) hours as needed (reflux).     Allergies:   Codeine, Other, Erythromycin, Lisinopril, Mycophenolate mofetil, and Sulfa antibiotics   Social History   Socioeconomic History   Marital status: Married    Spouse name: Not on file   Number of children: 2   Years of education: Not on file   Highest education level: Not on file  Occupational History    Occupation: Retired  Tobacco Use   Smoking status: Never   Smokeless tobacco: Never  Vaping Use   Vaping Use: Never used  Substance and Sexual Activity   Alcohol use: No   Drug use: No   Sexual activity: Not Currently  Other Topics Concern   Not on file  Social History Narrative   Married '611 son- '65, 1 daughter '63; 6 children (2 adopted)SO- SOBRetirement- doing well. Marriage in good health. Patient has never smoked. Alcohol use- noPt gets regular exercise   Social Determinants of Health   Financial Resource Strain: Low Risk  (04/02/2022)   Overall Financial Resource Strain (CARDIA)    Difficulty of Paying Living Expenses: Not hard at all  Food Insecurity: No Food Insecurity (04/02/2022)   Hunger Vital Sign    Worried About Running Out of Food in the Last Year: Never true    Ran Out of Food in the Last Year: Never true  Transportation Needs: No Transportation Needs (04/02/2022)   PRAPARE - Hydrologist (Medical): No    Lack of Transportation (Non-Medical): No  Physical Activity: Inactive (04/02/2022)   Exercise Vital Sign    Days of Exercise per Week: 0 days    Minutes of Exercise per Session: 0 min  Stress: No Stress Concern Present (04/02/2022)   [place]    Feeling of Stress : Not at all  Social Connections: [place] (04/02/2022)   Social Connection and Isolation Panel [NHANES]    Frequency of Communication with Friends and Family: More than three times a week    Frequency of Social Gatherings with Friends and Family: More than three times a week    Attends Religious Services: More than 4 times per year    Active Member of [employer] or Organizations: Yes    Attends Music therapist: More than 4 times per year    Marital Status: Married     Family History: The patient's family history includes Alzheimer's disease in her mother; Bladder Cancer in her father;  Diabetes in her father and sister; Esophageal cancer in her paternal uncle; Lung cancer in her sister; Prostate cancer in her father. There is no history of Colon cancer.  ROS:   Please see the history of present illness.     EKGs/Labs/Other Studies  Reviewed:    EKG:  The ekg ordered today demonstrates normal sinus rhythm, heart rate 76, new inverted T waves in V6.  Recent Labs: 03/17/2022: B Natriuretic Peptide 2,025.3 03/18/2022: Hemoglobin 11.5; Magnesium 1.9; Platelets 244 03/31/2022: ALT 12; BUN 39; Creatinine, Ser 1.33; Potassium 4.4; Sodium 137   Recent Lipid Panel Lab Results  Component Value Date/Time   CHOL 158 06/24/2018 10:04 AM   TRIG 112.0 06/24/2018 10:04 AM   HDL 57.50 06/24/2018 10:04 AM   LDLCALC 78 06/24/2018 10:04 AM    Physical Exam:    VS:  BP 120/66   Pulse 76   Ht _0  (1.6 m)   Wt 105 lb 9.6 oz (47.9 kg)   SpO2 93%   BMI 18.71 kg/m    No data found.  Wt Readings from Last 3 Encounters:  04/03/22 105 lb 9.6 oz (47.9 kg)  03/31/22 105 lb (47.6 kg)  03/22/22 110 lb 14.4 oz (50.3 kg)     GEN:  Well nourished, well developed in no acute distress, walks with cane HEENT: Normal NECK: No JVD; No carotid bruits CARDIAC: RRR, systolic murmur  RESPIRATORY: Crackles and rhonchi in the bases bilaterally ABDOMEN: Soft, non-tender, non-distended MUSCULOSKELETAL: No edema; No deformity  SKIN: Warm and dry NEUROLOGIC:  Alert and oriented PSYCHIATRIC:  Normal affect    ASSESSMENT AND PLAN   Chronic hypoxic respiratory failure, stable -Acute exacerbation 03/17/22 2/2 Pneumonia, ILD, pericardial effusion, chronic diastolic congestive heart failure.   -Wears oxygen at night -Has history of of ILD/crest syndrome/scleroderma. -Pulmonology recommended follow-up in 4 to 6 weeks for CT chest to ensure resolution of  - this is scheduled.   Pulmonary artery hypertension -[facility] 11/2021: Cath performed on baseline PH med of maciitentan -RA 7, RV 50/8, PA 50/21 mean 32,  wedge 21.  Cardiac output is 6 and index of 4.1 L/min/m2.  PVR is 1.8 WU  and SVR of 10.7 WU with NIBP 141/54 mean 71  Recommendations:  Chronic diastolic dysfunction and elevated left sided filling pressure with the mitral stenosis playing some role  No indication for further pulmonary vasodilator.  Data does not suggest that effusion is due to the [facility] with low RA pressure. No evidence for tamponade. -Continued on macitentan  -Continue Cardizem -Is transferring [facility] from [facility]ology to [facility] pulmonology  Large pericardial effusion -Chronic pericardial effusion dating back to 2003 -Echo during admission with large circumferential pericardial effusion but no clinical or echocardiogram evidence of cardiac tamponade -Follow clinically, may consider repeating echo in 2-3 months to evaluate effusion   Chest pressure, resolved -Atypical for ischemia & resolved -CE low and flat, EKG nonischemic, Echo this admission with normal EF and no WMA -Normal coronaries on remote heart cath (2003) -No CT coronaries or heart cath this admission due to contrast induced nephropathy -- no urgency now given no recurrence of chest pain -Given aortic stenosis and likely need for TAVR, would consider deferring CT coronary and completing right and left heart catheterization.   Moderate aortic stenosis -Echo 02/2022: Peak and mean gradients through the valve are 45 and 25 mm Hg respectively Dimensionless index is 0.26. Overall consistent with moderate AS. Compared to echo report from Jan 2023 [facility]), no significant change.  -f/u with Dr. Burt Knack on 04/11/2022 -Denies chest pain, resting shortness of breath, syncope and heart failure symptoms   Chronic diastolic congestive heart failure, euvolemic -Echo showed EF of 55 to 37%, grade 1 diastolic dysfunction -Resumed spironolactone 72m daily and Lasix 256mdaily on  hospital discharge. -Advised to take an extra Lasix 20 mg x 2 days if weight gain of 3 pounds in 1  day or 5 pounds in 1 week or LE swelling   History of GI bleed -Suspected to be from AVMs.  On PPI   History of PE -Recent CTA negative for PE.  With IVC filter   History of renal cancer/breast cancer -Currently in remission  Disposition - Follow-up with Dr. Burt Knack next week.  Follow-up with Dr. Stanford Breed in 4 months.   Medication Adjustments/Labs and Tests Ordered: Current medicines are reviewed at length with the patient today.  Concerns regarding medicines are outlined above.  Orders Placed This Encounter  Procedures   EKG 12-Lead   No orders of the defined types were placed in this encounter.   Patient Instructions  Medication Instructions:  No changes *If you need a refill on your cardiac medications before your next appointment, please call your pharmacy*   Lab Work: No Labs If you have labs (blood work) drawn today and your tests are completely normal, you will receive your results only by: [place] (if you have MyChart) OR A paper copy in the mail If you have any lab test that is abnormal or we need to change your treatment, we will call you to review the results.   Testing/Procedures: No Testing   Follow-Up: At [facility], you and your health needs are our priority.  As part of our continuing mission to provide you with exceptional heart care, we have created designated Provider Care Teams.  These Care Teams include your primary Cardiologist (physician) and Advanced Practice Providers (APPs -  Physician Assistants and Nurse Practitioners) who all work together to provide you with the care you need, when you need it.  We recommend signing up for the patient portal called "MyChart".  Sign up information is provided on this After Visit Summary.  MyChart is used to connect with patients for Virtual Visits (Telemedicine).  Patients are able to view lab/test results, encounter notes, upcoming appointments, etc.  Non-urgent messages can be sent to your provider  as well.   To learn more about what you can do with MyChart, go to [email or web address].    Your next appointment:   4 month(s)  The format for your next appointment:   In Person  Provider:   Kirk Ruths, MD       Important Information About Sugar         Signed, Gaston Islam  04/03/2022 8:48 AM    [place]

## 2022-04-02 NOTE — Progress Notes (Addendum)
Subjective:   Paula Ward is a 86 y.o. female who presents for Medicare Annual (Subsequent) preventive examination.  I connected with Ketra today by phone and verified that I am speaking with the correct person using two identifiers. Location patient: home Location provider: work  Review of Systems    No ROS. Medicare Wellness Telephone Visit. Additional risk factors are reflected in social history.       Objective:    There were no vitals filed for this visit. There is no height or weight on file to calculate BMI.     03/18/2022    4:02 PM 03/17/2022    4:09 PM 01/06/2022    9:08 AM 04/12/2021    7:09 PM 04/12/2021    5:12 PM 03/28/2021   10:00 PM 03/28/2021    1:09 PM  Advanced Directives  Does Patient Have a Medical Advance Directive? No No No Yes Yes Yes Yes  Type of Scientist, research (medical);Living will [place];Living will Living will;Healthcare Power of Attorney Living will;Healthcare Power of Attorney  Does patient want to make changes to medical advance directive?    No - Patient declined  No - Patient declined   Copy of [place] in Chart?    No - copy requested     Would patient like information on creating a medical advance directive? No - Patient declined  No - Patient declined        Current Medications (verified) Outpatient Encounter Medications as of 04/02/2022  Medication Sig   acetaminophen (TYLENOL) 325 MG tablet Take 650 mg by mouth every 6 (six) hours as needed for mild pain.    albuterol (VENTOLIN HFA) 108 (90 Base) MCG/ACT inhaler INHALE 2 PUFFS INTO THE LUNGS EVERY 6 HOURS AS NEEDED FOR WHEEZING OR SHORTNESS OF BREATH (Patient taking differently: Inhale 2 puffs into the lungs every 6 (six) hours as needed for wheezing or shortness of breath.)   ALPRAZolam (XANAX) 0.25 MG tablet Take 0.5 tablets (0.125 mg total) by mouth at bedtime as needed. (Patient taking differently: Take 0.125 mg by  mouth at bedtime as needed for anxiety or sleep.)   chlorhexidine (PERIDEX) 0.12 % solution 15 mLs by Mouth Rinse route 2 (two) times daily as needed (sore mouth).   Cyanocobalamin (VITAMIN B-12 PO) Take 1 tablet by mouth daily. Unsure of dose   diltiazem (CARDIZEM CD) 120 MG 24 hr capsule Take 120 mg by mouth daily.   fluticasone (FLONASE) 50 MCG/ACT nasal spray INSTILL 1 SPRAY INTO EACH NOSTRIL TWICE A DAY IF NEEDED (Patient taking differently: Place 1 spray into both nostrils daily as needed for allergies.)   furosemide (LASIX) 20 MG tablet Take 1 tablet by mouth once daily   guaiFENesin (MUCINEX) 600 MG 12 hr tablet Take 150 mg by mouth daily.   magic mouthwash SOLN Take 10 mLs by mouth 4 (four) times daily as needed for mouth pain.   montelukast (SINGULAIR) 10 MG tablet TAKE 1 TABLET BY MOUTH AT BEDTIME (Patient taking differently: Take 10 mg by mouth at bedtime as needed (allergies).)   Multiple Vitamins-Iron (MULTIVITAMIN/IRON PO) Take 1 tablet by mouth daily.   Multiple Vitamins-Minerals (ZINC PO) Take 1 tablet by mouth daily. Unsure of dose   OPSUMIT 10 MG TABS Take 10 mg by mouth daily.   OXYGEN Inhale into the lungs. 2 LMP at night and upon exertion   pantoprazole (PROTONIX) 40 MG tablet Take 1 tablet (40  mg total) by mouth 2 (two) times daily before a meal.   Pyridoxine HCl (VITAMIN B6 PO) Take 1 tablet by mouth daily. Unsure of dose   spironolactone (ALDACTONE) 25 MG tablet Take 12.5 mg by mouth daily.   sucralfate (CARAFATE) 1 GM/10ML suspension Take 1 g by mouth every 6 (six) hours as needed (reflux).   No facility-administered encounter medications on file as of 04/02/2022.    Allergies (verified) Codeine, Other, Erythromycin, Lisinopril, Mycophenolate mofetil, and Sulfa antibiotics   History: Past Medical History:  Diagnosis Date   Anemia    Angiodysplasia of stomach and duodenum    Arthus phenomenon    AVM (arteriovenous malformation)    Blood transfusion without  reported diagnosis    last 4 units 12-22-15, Iron infusion x2 last -01-07-16,01-14-16.   Breast cancer ([place]) 1989   Left   Candida esophagitis (HCC)    Cataract    Chronic kidney disease    Chronic mild renal insuffiency   CREST syndrome (HCC)    GERD (gastroesophageal reflux disease)    w/ HH   Interstitial lung disease (HCC)    Nodule of kidney    Pericardial effusion    PONV (postoperative nausea and vomiting)    Pulmonary embolus ([place]) 2003   Pulmonary hypertension ([place])    followed by Dr Gaynell Face at [facility], now Dr. Maryjean Ka visit end 2'17.   Rectal incontinence    Renal cell carcinoma (HCC)    Scleroderma (HCC)    Tubular adenoma of colon    Uterine polyp    Past Surgical History:  Procedure Laterality Date   APPENDECTOMY  1962   BREAST LUMPECTOMY  1989   left   CATARACT EXTRACTION, BILATERAL Bilateral 12/2013   COLONOSCOPY WITH PROPOFOL N/A 04/15/2021   Procedure: COLONOSCOPY WITH PROPOFOL;  Surgeon: Milus Banister, MD;  Location: [facility];  Service: Endoscopy;  Laterality: N/A;   ENTEROSCOPY N/A 01/18/2016   Procedure: ENTEROSCOPY;  Surgeon: Mauri Pole, MD;  [facility];  Service: Endoscopy;  Laterality: N/A;   ENTEROSCOPY N/A 05/24/2018   Procedure: ENTEROSCOPY;  Surgeon: Jackquline Denmark, MD;  [facility];  Service: Endoscopy;  Laterality: N/A;   ENTEROSCOPY N/A 03/29/2021   Procedure: ENTEROSCOPY;  Surgeon: Jerene Bears, MD;  [facility];  Service: Gastroenterology;  Laterality: N/A;   ENTEROSCOPY N/A 04/13/2021   Procedure: ENTEROSCOPY;  Surgeon: Yetta Flock, MD;  [facility];  Service: Gastroenterology;  Laterality: N/A;   ESOPHAGOGASTRODUODENOSCOPY (EGD) WITH PROPOFOL N/A 12/21/2015   Procedure: ESOPHAGOGASTRODUODENOSCOPY (EGD) WITH PROPOFOL;  Surgeon: Irene Shipper, MD;  [facility];  Service: Endoscopy;  Laterality: N/A;   HOT HEMOSTASIS N/A 05/24/2018   Procedure: HOT HEMOSTASIS (ARGON PLASMA  COAGULATION/BICAP);  Surgeon: Jackquline Denmark, MD;  [facility];  Service: Endoscopy;  Laterality: N/A;   HOT HEMOSTASIS N/A 03/29/2021   Procedure: HOT HEMOSTASIS (ARGON PLASMA COAGULATION/BICAP);  Surgeon: Jerene Bears, MD;  Location: [facility];  Service: Gastroenterology;  Laterality: N/A;   HOT HEMOSTASIS N/A 04/13/2021   Procedure: HOT HEMOSTASIS (ARGON PLASMA COAGULATION/BICAP);  Surgeon: Yetta Flock, MD;  Location[facility];  Service: Gastroenterology;  Laterality: N/A;   IR GENERIC HISTORICAL  06/05/2016   IR RADIOLOGIST EVAL & MGMT 06/05/2016 Aletta Edouard, MD [facility] INTERV RAD   IVC Filter     KIDNEY SURGERY     left -"laser surgery by Dr. Kathlene Cote- 5 yrs ago" no removal   SCHLEROTHERAPY  05/24/2018   Procedure: SCHLEROTHERAPY;  Surgeon: Jackquline Denmark, MD;  Location: [facility];  Service: Endoscopy;;   SUBMUCOSAL TATTOO INJECTION  04/13/2021   Procedure: SUBMUCOSAL TATTOO INJECTION;  Surgeon: Yetta Flock, MD;  [facility];  Service: Gastroenterology;;   TONSILLECTOMY     TUBAL LIGATION     Family History  Problem Relation Age of Onset   Bladder Cancer Father    Diabetes Father    Prostate cancer Father    Alzheimer's disease Mother    Diabetes Sister    Lung cancer Sister         smoker   Esophageal cancer Paternal Uncle    Colon cancer Neg Hx    Social History   Socioeconomic History   Marital status: Married    Spouse name: Not on file   Number of children: 2   Years of education: Not on file   Highest education level: Not on file  Occupational History   Occupation: Retired  Tobacco Use   Smoking status: Never   Smokeless tobacco: Never  Vaping Use   Vaping Use: Never used  Substance and Sexual Activity   Alcohol use: No   Drug use: No   Sexual activity: Not Currently  Other Topics Concern   Not on file  Social History Narrative   Married '611 son- '65, 1 daughter '63; 6 children (2 adopted)SO- SOBRetirement-  doing well. Marriage in good health. Patient has never smoked. Alcohol use- noPt gets regular exercise   Social Determinants of Health   Financial Resource Strain: Low Risk  (04/02/2022)   Overall Financial Resource Strain (CARDIA)    Difficulty of Paying Living Expenses: Not hard at all  Food Insecurity: No Food Insecurity (04/02/2022)   Hunger Vital Sign    Worried About Running Out of Food in the Last Year: Never true    Ran Out of Food in the Last Year: Never true  Transportation Needs: No Transportation Needs (04/02/2022)   PRAPARE - Hydrologist (Medical): No    Lack of Transportation (Non-Medical): No  Physical Activity: Inactive (04/02/2022)   Exercise Vital Sign    Days of Exercise per Week: 0 days    Minutes of Exercise per Session: 0 min  Stress: No Stress Concern Present (04/02/2022)   [place]    Feeling of Stress : Not at all  Social Connections: [place] (04/02/2022)   Social Connection and Isolation Panel [NHANES]    Frequency of Communication with Friends and Family: More than three times a week    Frequency of Social Gatherings with Friends and Family: More than three times a week    Attends Religious Services: More than 4 times per year    Active Member of [employer] or Organizations: Yes    Attends Music therapist: More than 4 times per year    Marital Status: Married    Tobacco Counseling Counseling given: Not Answered   Clinical Intake:  Pre-visit preparation completed: Yes  Pain : No/denies pain     Diabetes: No  How often do you need to have someone help you when you read instructions, pamphlets, or other written materials from your doctor or pharmacy?: 1 - Never What is the last grade level you completed in school?: 2 years of college  Diabetic? No  Interpreter Needed?: No  Information entered by :: Jillene Bucks,  CMA   Activities of Daily Living    03/18/2022  4:02 PM 04/12/2021    7:11 PM  In your present state of health, do you have any difficulty performing the following activities:  Hearing? 0   Vision? 0   Difficulty concentrating or making decisions? 0   Walking or climbing stairs? 0   Dressing or bathing? 0   Doing errands, shopping? 0 0    Patient Care Team: Hoyt Koch, MD as PCP - General (Internal Medicine) Stanford Breed Denice Bors, MD as PCP - Cardiology (Cardiology)  Indicate any recent Medical Services you may have received from other than [facility] providers in the past year (date may be approximate).     Assessment:   This is a routine wellness examination for [place].  Hearing/Vision screen Patient denied any hearing difficulty. No hearing aids. Patient only wears reading glasses.  Dietary issues and exercise activities discussed:     Goals Addressed               This Visit's Progress     Patient Stated (pt-stated)        I am working on getting back to feeling better since my last trip to the hospital.       Depression Screen    03/31/2022    1:25 PM 03/13/2022    3:22 PM 01/06/2022    9:16 AM 01/10/2021    9:21 AM 10/28/2019    2:03 PM 02/01/2018    2:32 PM 09/03/2017    3:00 PM  PHQ 2/9 Scores  PHQ - 2 Score 0 0 0 0 0 1 0  PHQ- 9 Score 0 0 1   4     Fall Risk    04/02/2022    4:30 PM 03/31/2022    1:25 PM 01/06/2022    9:15 AM 03/08/2021    8:44 AM 03/06/2020    3:13 PM  Fall Risk   Falls in the past year? 0 0 0 0 0  Number falls in past yr: 0 0 0 0 0  Injury with Fall? 0 0 0 0 0  Risk for fall due to : No Fall Risks  History of fall(s);Impaired balance/gait    Follow up Falls evaluation completed  Education provided;Falls prevention discussed      FALL RISK PREVENTION PERTAINING TO THE HOME:  Any stairs in or around the home? No  If so, are there any without handrails? No  Home free of loose throw rugs in walkways, pet beds, electrical  cords, etc? Yes  Adequate lighting in your home to reduce risk of falls? Yes   ASSISTIVE DEVICES UTILIZED TO PREVENT FALLS:  Life alert? No  Use of a cane, walker or w/c? Yes cane sometimes Grab bars in the bathroom? Yes  Shower chair or bench in shower? No  Elevated toilet seat or a handicapped toilet? Yes   TIMED UP AND GO:  Was the test performed? No .  Length of time to ambulate 10 feet: N/A sec.    Cognitive Function:    02/01/2018    2:35 PM  MMSE - Mini Mental State Exam  Orientation to time 5  Orientation to Place 5  Registration 3  Attention/ Calculation 5  Recall 2  Language- name 2 objects 2  Language- repeat 1  Language- follow 3 step command 3  Language- read & follow direction 1  Write a sentence 1  Copy design 1  Total score 29        04/02/2022    4:30 PM  6CIT Screen  What Year? 0 points  What month? 0 points  What time? 0 points  Count back from 20 0 points  Months in reverse 0 points  Repeat phrase 0 points  Total Score 0 points    Immunizations Immunization History  Administered Date(s) Administered   Fluad Quad(high Dose 65+) 07/24/2020   Influenza, High Dose Seasonal PF 07/07/2017, 06/21/2019   Influenza-Unspecified 07/15/2013, 02/16/2015, 07/19/2015, 07/18/2016, 08/20/2018, 06/24/2021   Moderna Sars-Covid-2 Vaccination 11/01/2019, 11/29/2019, 09/14/2020, 02/16/2021   Pneumococcal Conjugate-13 01/30/2014   Pneumococcal Polysaccharide-23 08/30/2007   Tdap 06/26/2011, 06/07/2018   Zoster, Live 08/15/2014    TDAP status: Up to date  Flu Vaccine status: Up to date  Pneumococcal vaccine status: Up to date  Covid-19 vaccine status: Completed vaccines  Qualifies for Shingles Vaccine? Yes   Zostavax completed No   Shingrix Completed?: No.    Education has been provided regarding the importance of this vaccine. Patient has been advised to call insurance company to determine out of pocket expense if they have not yet received this  vaccine. Advised may also receive vaccine at local pharmacy or Health Dept. Verbalized acceptance and understanding.  Screening Tests Health Maintenance  Topic Date Due   Zoster Vaccines- Shingrix (1 of 2) Never done   COVID-19 Vaccine (5 - Booster for Moderna series) 04/18/2022 (Originally 04/13/2021)   INFLUENZA VACCINE  05/20/2022   MAMMOGRAM  11/29/2022   TETANUS/TDAP  06/07/2028   Pneumonia Vaccine 52+ Years old  Completed   DEXA SCAN  Completed   HPV VACCINES  Aged Out   COLONOSCOPY (Pts 45-63yr Insurance coverage will need to be confirmed)  Discontinued    Health Maintenance  Health Maintenance Due  Topic Date Due   Zoster Vaccines- Shingrix (1 of 2) Never done    Colorectal cancer screening: No longer required.   Mammogram status: Completed 11/29/21. Repeat every year  Bone Density status: Completed 08/28/2016.   Lung Cancer Screening: (Low Dose CT Chest recommended if Age 84-80years, 30 pack-year currently smoking OR have quit w/in 15years.) does not qualify.   Lung Cancer Screening Referral: N/A  Additional Screening:  Hepatitis C Screening: does not qualify; Completed N/A  Vision Screening: Recommended annual ophthalmology exams for early detection of glaucoma and other disorders of the eye. Is the patient up to date with their annual eye exam?  Yes  Who is the provider or what is the name of the office in which the patient attends annual eye exams? Dr. MGeorgina PeerIf pt is not established with a provider, would they like to be referred to a provider to establish care? No .   Dental Screening: Recommended annual dental exams for proper oral hygiene  Community Resource Referral / Chronic Care Management: CRR required this visit?  No   CCM required this visit?  No      Plan:     I have personally reviewed and noted the following in the patient's chart:   Medical and social history Use of alcohol, tobacco or illicit drugs  Current medications and  supplements including opioid prescriptions.  Functional ability and status Nutritional status Physical activity Advanced directives List of other physicians Hospitalizations, surgeries, and ER visits in previous 12 months Vitals Screenings to include cognitive, depression, and falls Referrals and appointments  In addition, I have reviewed and discussed with patient certain preventive protocols, quality metrics, and best practice recommendations. A written personalized care plan for preventive services as well as general preventive health recommendations were provided to patient.  Rossie Muskrat, [place]   04/02/2022   Nurse Notes:   Time Spent with patient on telephone encounter: 20 min

## 2022-04-02 NOTE — Patient Instructions (Signed)
It was great speaking with you today!  Please schedule your next Medicare Wellness Visit with your Nurse Health Advisor in 1 year by calling [phone].

## 2022-04-03 ENCOUNTER — Ambulatory Visit (INDEPENDENT_AMBULATORY_CARE_PROVIDER_SITE_OTHER): Payer: Medicare PPO | Admitting: Physician Assistant

## 2022-04-03 ENCOUNTER — Encounter: Payer: Self-pay | Admitting: Physician Assistant

## 2022-04-03 VITALS — BP 120/66 | HR 76 | Ht 63.0 in | Wt 105.6 lb

## 2022-04-03 DIAGNOSIS — I5032 Chronic diastolic (congestive) heart failure: Secondary | ICD-10-CM | POA: Diagnosis not present

## 2022-04-03 DIAGNOSIS — R072 Precordial pain: Secondary | ICD-10-CM

## 2022-04-03 DIAGNOSIS — I350 Nonrheumatic aortic (valve) stenosis: Secondary | ICD-10-CM

## 2022-04-03 DIAGNOSIS — I3139 Other pericardial effusion (noninflammatory): Secondary | ICD-10-CM | POA: Diagnosis not present

## 2022-04-03 NOTE — Patient Instructions (Signed)
Medication Instructions:  No changes *If you need a refill on your cardiac medications before your next appointment, please call your pharmacy*   Lab Work: No Labs If you have labs (blood work) drawn today and your tests are completely normal, you will receive your results only by: [place] (if you have MyChart) OR A paper copy in the mail If you have any lab test that is abnormal or we need to change your treatment, we will call you to review the results.   Testing/Procedures: No Testing   Follow-Up: At [facility], you and your health needs are our priority.  As part of our continuing mission to provide you with exceptional heart care, we have created designated Provider Care Teams.  These Care Teams include your primary Cardiologist (physician) and Advanced Practice Providers (APPs -  Physician Assistants and Nurse Practitioners) who all work together to provide you with the care you need, when you need it.  We recommend signing up for the patient portal called "MyChart".  Sign up information is provided on this After Visit Summary.  MyChart is used to connect with patients for Virtual Visits (Telemedicine).  Patients are able to view lab/test results, encounter notes, upcoming appointments, etc.  Non-urgent messages can be sent to your provider as well.   To learn more about what you can do with MyChart, go to [email or web address].    Your next appointment:   4 month(s)  The format for your next appointment:   In Person  Provider:   Kirk Ruths, MD       Important Information About Sugar

## 2022-04-04 NOTE — Assessment & Plan Note (Addendum)
Her weight has decreased during this illness likely due to increased caloric need due to respiratory effort. She is working on intake to improve this back to normal. Encouraged to add boost or ensure or similar for next month or two.

## 2022-04-04 NOTE — Assessment & Plan Note (Signed)
PE ruled out during hospital stay currently.

## 2022-04-04 NOTE — Assessment & Plan Note (Signed)
We talked about how pneumonia on her background lung disease she will likely take longer than average to improve back to baseline and this could take 4-6 weeks.

## 2022-04-04 NOTE — Assessment & Plan Note (Signed)
Will need follow up CT imaging which is already arranged for July. She is improving slightly from initial. She feels her improvement is sluggish from discharge to now which is reasonable. Ordered home health for nursing given change in health status. She is able to walk around the home with stability and likely does not need home health PT or OT. She has finished antibiotics without side effects and lungs sound to her baseline on exam today.

## 2022-04-04 NOTE — Assessment & Plan Note (Signed)
Was stable when she left the hospital and no signs of bleeding so will not recheck today.

## 2022-04-04 NOTE — Assessment & Plan Note (Signed)
Checking CMP today to see if this is improving. She is eating and drinking adequately at this time.

## 2022-04-07 ENCOUNTER — Telehealth: Payer: Self-pay

## 2022-04-07 ENCOUNTER — Inpatient Hospital Stay: Payer: Medicare PPO | Admitting: Internal Medicine

## 2022-04-07 NOTE — Telephone Encounter (Signed)
Paula Ward is calling to check the status of the [facility] referral to be able to follow up with the agency.

## 2022-04-08 ENCOUNTER — Ambulatory Visit: Payer: Medicare PPO | Admitting: Cardiovascular Disease

## 2022-04-08 ENCOUNTER — Encounter: Payer: Self-pay | Admitting: Cardiovascular Disease

## 2022-04-08 VITALS — BP 112/50 | HR 83 | Ht 63.0 in | Wt 106.0 lb

## 2022-04-08 DIAGNOSIS — Z01810 Encounter for preprocedural cardiovascular examination: Secondary | ICD-10-CM | POA: Diagnosis not present

## 2022-04-08 DIAGNOSIS — I3139 Other pericardial effusion (noninflammatory): Secondary | ICD-10-CM | POA: Diagnosis not present

## 2022-04-08 DIAGNOSIS — I350 Nonrheumatic aortic (valve) stenosis: Secondary | ICD-10-CM

## 2022-04-08 DIAGNOSIS — I272 Pulmonary hypertension, unspecified: Secondary | ICD-10-CM | POA: Diagnosis not present

## 2022-04-08 NOTE — Patient Instructions (Signed)
Medication Instructions:  Your physician recommends that you continue on your current medications as directed. Please refer to the Current Medication list given to you today.  *If you need a refill on your cardiac medications before your next appointment, please call your pharmacy*   Lab Work: CBC & BMET (7 days prior to cath) If you have labs (blood work) drawn today and your tests are completely normal, you will receive your results only by: [place] (if you have MyChart) OR A paper copy in the mail If you have any lab test that is abnormal or we need to change your treatment, we will call you to review the results.   Testing/Procedures: Cardiac Catheterization Your physician has requested that you have a cardiac catheterization. Cardiac catheterization is used to diagnose and/or treat various heart conditions. Doctors may recommend this procedure for a number of different reasons. The most common reason is to evaluate chest pain. Chest pain can be a symptom of coronary artery disease (CAD), and cardiac catheterization can show whether plaque is narrowing or blocking your heart's arteries. This procedure is also used to evaluate the valves, as well as measure the blood flow and oxygen levels in different parts of your heart. For further information please visit [email or web address]. Please follow instruction sheet, as given.    Follow-Up: At [facility], you and your health needs are our priority.  As part of our continuing mission to provide you with exceptional heart care, we have created designated Provider Care Teams.  These Care Teams include your primary Cardiologist (physician) and Advanced Practice Providers (APPs -  Physician Assistants and Nurse Practitioners) who all work together to provide you with the care you need, when you need it.  Your next appointment:   Structural team will follow-up  Provider:   Sherren Mocha, MD  Other Instructions  [facility] OFFICE [place], [place] [address] Dept: [phone] Loc: [place]  04/08/2022  You are scheduled for a Cardiac Catheterization on Friday, July 7 with Dr. Sherren Mocha.  1. Please arrive at the Main Entrance A at [facility]: [place], [place] [address] at 7:30 AM (This time is two hours before your procedure to ensure your preparation). Free valet parking service is available.   Special note: Every effort is made to have your procedure done on time. Please understand that emergencies sometimes delay scheduled procedures.  2. Diet: Do not eat solid foods after midnight.  You may have clear liquids until 5 AM upon the day of the procedure.  3. Labs: You will need to have blood drawn on Monday, July 3 at [facility] at [facility]. [address]  Open: 7:30am - 5pm    Phone: [phone]. You do not need to be fasting.  4. Medication instructions in preparation for your procedure:   Contrast Allergy: No  DO NOT TAKE FUROSEMIDE day of procedure DO NOT TAKE SPIRONOLACTONE day of procedure  On the morning of your procedure, take Aspirin 81MG and any morning medicines NOT listed above.  You may use sips of water.  5. Plan to go home the same day, you will only stay overnight if medically necessary. 6. You MUST have a responsible adult to drive you home. 7. An adult MUST be with you the first 24 hours after you arrive home. 8. Bring a current list of your  medications, and the last time and date medication taken. 9. Bring ID and current insurance cards. 10.Please wear clothes that are easy to get on and off and wear slip-on shoes.  Thank you for allowing [place] to care for you!   -- [facility]   Important Information About Sugar

## 2022-04-08 NOTE — H&P (View-Only) (Signed)
Cardiology Office Note:    Date:  04/08/2022   ID:  Paula Ward, DOB 04-07-1936, MRN [id number]  PCP:  Hoyt Koch, MD   [facility] Providers Cardiologist:  Kirk Ruths, MD     Referring MD: Hoyt Koch, *   Chief Complaint  Patient presents with   Aortic Stenosis    History of Present Illness:    Paula Ward is a 86 y.o. female presenting for evaluation of aortic stenosis.  She has a complex history with pulmonary hypertension, scleroderma, chronic pericardial effusion, chronic kidney disease, history of pulmonary embolus, gastrointestinal bleeding requiring IVC filter placement many years ago.  The patient has been followed for moderate aortic stenosis.  She is followed in the pulmonary hypertension clinic at [facility] as well.  Dr. Stanford Breed is her primary cardiologist.  She recently was hospitalized with chest discomfort and this was ultimately felt to not be related to ischemic heart disease with minimal troponin elevation.  While she was hospitalized, she had a CT scan with contrast and developed contrast nephropathy with her creatinine increasing from 1.2 up to 2.0, now back down to 1.33 on her last labs.  The patient has had a large pericardial effusion that has been followed over time with serial imaging.  The majority of the effusion is over on the posterolateral side of the heart and has not been felt to be easily accessible for needle pericardiocentesis.  Her last right heart catheterization at [facility] was in February of this year.  This demonstrated a mean PA pressure of 32 mmHg, pulmonary capillary wedge pressure of 21 mmHg, cardiac output of 6 L/min, and PVR of 1.8 Wood units.  Right atrial pressure was normal at 7 mmHg and not felt to be contributing to her pleural effusion.  The patient is here with her husband today.  She has had a longstanding heart murmur.  Her children live in [place] and [place], [place].  The patient  remains functionally independent but she has slowed down over the past few years.  She wears nocturnal oxygen.  She has a hard time getting to [facility] for any evaluations because of the distance involved.  She prefers to be cared for in [place] as much as possible just because of travel issues.  The patient denies orthopnea or PND, but she has poor sleep.  She does have occasional pressure in her chest and she is limited by exertional dyspnea.  She has to stop and rest frequently with light housework such as cooking.  She essentially reports functional class III symptoms.  Past Medical History:  Diagnosis Date   Anemia    Angiodysplasia of stomach and duodenum    Arthus phenomenon    AVM (arteriovenous malformation)    Blood transfusion without reported diagnosis    last 4 units 12-22-15, Iron infusion x2 last -01-07-16,01-14-16.   Breast cancer ([place]) 1989   Left   Candida esophagitis (HCC)    Cataract    Chronic kidney disease    Chronic mild renal insuffiency   CREST syndrome (HCC)    GERD (gastroesophageal reflux disease)    w/ HH   Interstitial lung disease (HCC)    Nodule of kidney    Pericardial effusion    PONV (postoperative nausea and vomiting)    Pulmonary embolus ([place]) 2003   Pulmonary hypertension ([place])    followed by Dr Gaynell Face at [facility], now Dr. Maryjean Ka visit end 2'17.   Rectal incontinence  Renal cell carcinoma ([place])    Scleroderma ([place])    Tubular adenoma of colon    Uterine polyp     Past Surgical History:  Procedure Laterality Date   APPENDECTOMY  1962   BREAST LUMPECTOMY  1989   left   CATARACT EXTRACTION, BILATERAL Bilateral 12/2013   COLONOSCOPY WITH PROPOFOL N/A 04/15/2021   Procedure: COLONOSCOPY WITH PROPOFOL;  Surgeon: Milus Banister, MD;  Location: [facility];  Service: Endoscopy;  Laterality: N/A;   ENTEROSCOPY N/A 01/18/2016   Procedure: ENTEROSCOPY;  Surgeon: Mauri Pole, MD;  Location: [facility];  Service: Endoscopy;  Laterality:  N/A;   ENTEROSCOPY N/A 05/24/2018   Procedure: ENTEROSCOPY;  Surgeon: Jackquline Denmark, MD;  Location: [facility];  Service: Endoscopy;  Laterality: N/A;   ENTEROSCOPY N/A 03/29/2021   Procedure: ENTEROSCOPY;  Surgeon: Jerene Bears, MD;  Location: [facility];  Service: Gastroenterology;  Laterality: N/A;   ENTEROSCOPY N/A 04/13/2021   Procedure: ENTEROSCOPY;  Surgeon: Yetta Flock, MD;  Location: [facility];  Service: Gastroenterology;  Laterality: N/A;   ESOPHAGOGASTRODUODENOSCOPY (EGD) WITH PROPOFOL N/A 12/21/2015   Procedure: ESOPHAGOGASTRODUODENOSCOPY (EGD) WITH PROPOFOL;  Surgeon: Irene Shipper, MD;  Location: [facility];  Service: Endoscopy;  Laterality: N/A;   HOT HEMOSTASIS N/A 05/24/2018   Procedure: HOT HEMOSTASIS (ARGON PLASMA COAGULATION/BICAP);  Surgeon: Jackquline Denmark, MD;  [facility];  Service: Endoscopy;  Laterality: N/A;   HOT HEMOSTASIS N/A 03/29/2021   Procedure: HOT HEMOSTASIS (ARGON PLASMA COAGULATION/BICAP);  Surgeon: Jerene Bears, MD;  Location: [facility];  Service: Gastroenterology;  Laterality: N/A;   HOT HEMOSTASIS N/A 04/13/2021   Procedure: HOT HEMOSTASIS (ARGON PLASMA COAGULATION/BICAP);  Surgeon: Yetta Flock, MD;  Location: [facility];  Service: Gastroenterology;  Laterality: N/A;   IR GENERIC HISTORICAL  06/05/2016   IR RADIOLOGIST EVAL & MGMT 06/05/2016 Aletta Edouard, MD [facility] INTERV RAD   IVC Filter     KIDNEY SURGERY     left -"laser surgery by Dr. Kathlene Cote- 5 yrs ago" no removal   SCHLEROTHERAPY  05/24/2018   Procedure: SCHLEROTHERAPY;  Surgeon: Jackquline Denmark, MD;  Location: [facility];  Service: Endoscopy;;   SUBMUCOSAL TATTOO INJECTION  04/13/2021   Procedure: SUBMUCOSAL TATTOO INJECTION;  Surgeon: Yetta Flock, MD;  Location: [facility];  Service: Gastroenterology;;   TONSILLECTOMY     TUBAL LIGATION      Current Medications: Current Meds  Medication Sig   acetaminophen (TYLENOL) 325 MG tablet Take 650 mg by  mouth every 6 (six) hours as needed for mild pain.    albuterol (VENTOLIN HFA) 108 (90 Base) MCG/ACT inhaler INHALE 2 PUFFS INTO THE LUNGS EVERY 6 HOURS AS NEEDED FOR WHEEZING OR SHORTNESS OF BREATH (Patient taking differently: Inhale 2 puffs into the lungs every 6 (six) hours as needed for wheezing or shortness of breath.)   ALPRAZolam (XANAX) 0.25 MG tablet Take 0.5 tablets (0.125 mg total) by mouth at bedtime as needed. (Patient taking differently: Take 0.125 mg by mouth at bedtime as needed for anxiety or sleep.)   chlorhexidine (PERIDEX) 0.12 % solution 15 mLs by Mouth Rinse route 2 (two) times daily as needed (sore mouth).   Cyanocobalamin (VITAMIN B-12 PO) Take 1 tablet by mouth daily. Unsure of dose   diltiazem (CARDIZEM CD) 120 MG 24 hr capsule Take 120 mg by mouth daily.   fluticasone (FLONASE) 50 MCG/ACT nasal spray INSTILL 1 SPRAY INTO EACH NOSTRIL TWICE A DAY IF NEEDED (Patient taking differently: Place 1 spray into  both nostrils daily as needed for allergies.)   furosemide (LASIX) 20 MG tablet Take 1 tablet by mouth once daily   guaiFENesin (MUCINEX) 600 MG 12 hr tablet Take 150 mg by mouth daily.   magic mouthwash SOLN Take 10 mLs by mouth 4 (four) times daily as needed for mouth pain.   montelukast (SINGULAIR) 10 MG tablet TAKE 1 TABLET BY MOUTH AT BEDTIME (Patient taking differently: Take 10 mg by mouth at bedtime as needed (allergies).)   Multiple Vitamins-Iron (MULTIVITAMIN/IRON PO) Take 1 tablet by mouth daily.   Multiple Vitamins-Minerals (ZINC PO) Take 1 tablet by mouth daily. Unsure of dose   OPSUMIT 10 MG TABS Take 10 mg by mouth daily.   OXYGEN Inhale into the lungs. 2 LMP at night and upon exertion   pantoprazole (PROTONIX) 40 MG tablet Take 1 tablet (40 mg total) by mouth 2 (two) times daily before a meal.   Pyridoxine HCl (VITAMIN B6 PO) Take 1 tablet by mouth as needed. Unsure of dose   spironolactone (ALDACTONE) 25 MG tablet Take 12.5 mg by mouth daily.   sucralfate  (CARAFATE) 1 GM/10ML suspension Take 1 g by mouth every 6 (six) hours as needed (reflux).     Allergies:   Codeine, Other, Erythromycin, Lisinopril, Mycophenolate mofetil, and Sulfa antibiotics   Social History   Socioeconomic History   Marital status: Married    Spouse name: Not on file   Number of children: 2   Years of education: Not on file   Highest education level: Not on file  Occupational History   Occupation: Retired  Tobacco Use   Smoking status: Never   Smokeless tobacco: Never  Vaping Use   Vaping Use: Never used  Substance and Sexual Activity   Alcohol use: No   Drug use: No   Sexual activity: Not Currently  Other Topics Concern   Not on file  Social History Narrative   Married '611 son- '65, 1 daughter '63; 6 children (2 adopted)SO- SOBRetirement- doing well. Marriage in good health. Patient has never smoked. Alcohol use- noPt gets regular exercise   Social Determinants of Health   Financial Resource Strain: Low Risk  (04/02/2022)   Overall Financial Resource Strain (CARDIA)    Difficulty of Paying Living Expenses: Not hard at all  Food Insecurity: No Food Insecurity (04/02/2022)   Hunger Vital Sign    Worried About Running Out of Food in the Last Year: Never true    Ran Out of Food in the Last Year: Never true  Transportation Needs: No Transportation Needs (04/02/2022)   PRAPARE - Hydrologist (Medical): No    Lack of Transportation (Non-Medical): No  Physical Activity: Inactive (04/02/2022)   Exercise Vital Sign    Days of Exercise per Week: 0 days    Minutes of Exercise per Session: 0 min  Stress: No Stress Concern Present (04/02/2022)   [place]    Feeling of Stress : Not at all  Social Connections: [employer] (04/02/2022)   Social Connection and Isolation Panel [NHANES]    Frequency of Communication with Friends and Family: More than three times a  week    Frequency of Social Gatherings with Friends and Family: More than three times a week    Attends Religious Services: More than 4 times per year    Active Member of [employer] or Organizations: Yes    Attends Archivist Meetings: More than 4  times per year    Marital Status: Married     Family History: The patient's family history includes Alzheimer's disease in her mother; Bladder Cancer in her father; Diabetes in her father and sister; Esophageal cancer in her paternal uncle; Lung cancer in her sister; Prostate cancer in her father. There is no history of Colon cancer.  ROS:   Please see the history of present illness.    All other systems reviewed and are negative.  EKGs/Labs/Other Studies Reviewed:    The following studies were reviewed today: Echo 03/18/22:  1. Left ventricular ejection fraction, by estimation, is 55 to 60%. The  left ventricle has normal function. The left ventricle has no regional  wall motion abnormalities. There is moderate left ventricular hypertrophy.  Left ventricular diastolic  parameters are consistent with Grade I diastolic dysfunction (impaired  relaxation). Elevated left atrial pressure.   2. Right ventricular systolic function is normal. The right ventricular  size is normal.   3. Left atrial size was severely dilated.   4. Large pericardial effusion surrounds heart Most prominent along  lateral side of LV 24 mm in maximal dimension. There is no evidence by  echo criteria for hemodynamic compromise. Mitral inflow shows no  signficant respiratory variation, there is no RV  collapse, IVC is normal size. . Large pleural effusion.   5. Signficant mitral annular calcification. Mitral leaflets are thickened  with moderately restricted motion. Peak and mean gradients through the  valve are 12 and 7 mm Hg respectively. (HR =91 bpm). The mitral valve is  abnormal. Trivial mitral valve  regurgitation. No evidence of mitral stenosis. Moderate to  severe mitral  annular calcification.   6. AV is thickened, calcified with restricted motion. Peak and mean  gradients through the valve are 45 and 25 mm Hg respectively Dimensionless  index is 0.26. Overall consistent with moderate AS. Compared to echo  report from Jan 2023 [facility]), no  significant change. . The aortic valve is tricuspid. Aortic valve  regurgitation is not visualized.   Recent Labs: 03/17/2022: B Natriuretic Peptide 2,025.3 03/18/2022: Hemoglobin 11.5; Magnesium 1.9; Platelets 244 03/31/2022: ALT 12; BUN 39; Creatinine, Ser 1.33; Potassium 4.4; Sodium 137  Recent Lipid Panel    Component Value Date/Time   CHOL 158 06/24/2018 1004   TRIG 112.0 06/24/2018 1004   HDL 57.50 06/24/2018 1004   CHOLHDL 3 06/24/2018 1004   VLDL 22.4 06/24/2018 1004   LDLCALC 78 06/24/2018 1004     Risk Assessment/Calculations:           Physical Exam:    VS:  BP (!) 112/50   Pulse 83   Ht 5' 3" (1.6 m)   Wt 106 lb (48.1 kg)   SpO2 95%   BMI 18.78 kg/m     Wt Readings from Last 3 Encounters:  04/08/22 106 lb (48.1 kg)  04/03/22 105 lb 9.6 oz (47.9 kg)  03/31/22 105 lb (47.6 kg)     GEN:  Well nourished, well developed pleasant elderly woman in no acute distress HEENT: Normal NECK: No JVD; No carotid bruits LYMPHATICS: No lymphadenopathy CARDIAC: RRR, 3/6 harsh crescendo decrescendo murmur at the RUSB, mid-peaking RESPIRATORY:  bibasilar crackles are present ABDOMEN: Soft, non-tender, non-distended MUSCULOSKELETAL:  No edema; No deformity  SKIN: Warm and dry NEUROLOGIC:  Alert and oriented x 3 PSYCHIATRIC:  Normal affect   ASSESSMENT:    1. Nonrheumatic aortic valve stenosis   2. Pericardial effusion   3. Pulmonary hypertension ([place])  PLAN:    In order of problems listed above:  I have personally reviewed the patient's echo study.  She has preserved LV function.  The aortic valve is degenerated with calcification and leaflet restriction noted.  Peak and mean  transvalvular gradients vary greatly depending on the RR interval, but the best gradient measurements ICE demonstrated a mean gradient of 25 mmHg and peak gradient of 45 mmHg, dimensionless index of 0.26, and stroke-volume index of 34.  She also has moderate mitral stenosis with peak and mean transmitral gradients of 12 and 7 mmHg, respectively.  There is a large pericardial effusion mostly located posterior and lateral to the heart.  The patient understands that she has a complex clinical situation with at least moderate aortic stenosis, chronic large pericardial effusion that is not easily accessible by needle pericardiocentesis, and presence of scleroderma with pulmonary hypertension.  She had a recent admission for chest pain as outlined above and she is experiencing episodic chest pressure.  Unfortunately, she had contrast nephropathy related to CT angiogram performed during her hospitalization.  We discussed the natural history of aortic stenosis today in light of her specific medical problems.  We discussed potential treatment options including surgical aortic valve replacement, transcatheter aortic valve replacement, and ongoing medical therapy with clinical surveillance.  I agree that a right and left heart catheterization is indicated to better assess the patient's hemodynamics, degree of aortic stenosis, and coronary anatomy.  We reviewed the potential risks of diagnostic catheterization and specifically the risk of contrast nephropathy.  She will be hydrated before the procedure and I would plan on watching her overnight with fluid hydration afterwards depending on her hemodynamic data. I have reviewed the risks, indications, and alternatives to cardiac catheterization, possible angioplasty, and stenting with the patient. Risks include but are not limited to bleeding, infection, vascular injury, stroke, myocardial infection, arrhythmia, kidney injury, radiation-related injury in the case of prolonged  fluoroscopy use, emergency cardiac surgery, and death. The patient understands the risks of serious complication is 1-2 in 6812 with diagnostic cardiac cath and 1-2% or less with angioplasty/stenting.  I would make further plans regarding her aortic stenosis pending her cardiac catheterization results.  If the catheterization clearly demonstrates moderate aortic stenosis, I will likely recommend continued echo surveillance and clinical follow-up.  However, if she is found to have severe aortic stenosis, will need to have further discussions regarding continued testing with CT angiography studies of the heart as well as the chest, abdomen, and pelvis, and surgical referral as part of a multidisciplinary approach to her aortic stenosis.      Shared Decision Making/Informed Consent The risks [stroke (1 in 1000), death (1 in 1000), kidney failure [usually temporary] (1 in 500), bleeding (1 in 200), allergic reaction [possibly serious] (1 in 200)], benefits (diagnostic support and management of coronary artery disease) and alternatives of a cardiac catheterization were discussed in detail with Ms. Acheampong and she is willing to proceed.    Medication Adjustments/Labs and Tests Ordered: Current medicines are reviewed at length with the patient today.  Concerns regarding medicines are outlined above.  No orders of the defined types were placed in this encounter.  No orders of the defined types were placed in this encounter.   There are no Patient Instructions on file for this visit.   Signed, Sherren Mocha, MD  04/08/2022 12:31 PM    [place]

## 2022-04-08 NOTE — Progress Notes (Unsigned)
Cardiology Office Note:    Date:  04/08/2022   ID:  Paula Ward, DOB 21-Feb-1936, MRN [id number]  PCP:  [name], MD   [facility] Providers Cardiologist:  Olga Millers, MD     Referring MD: [name], *   Chief Complaint  Patient presents with   Aortic Stenosis    History of Present Illness:    Paula Ward is a 86 y.o. female presenting for evaluation of aortic stenosis.  She has a complex history with pulmonary hypertension, scleroderma, chronic pericardial effusion, chronic kidney disease, history of pulmonary embolus, gastrointestinal bleeding requiring IVC filter placement many years ago.  The patient has been followed for moderate aortic stenosis.  She is followed in the pulmonary hypertension clinic at [facility] as well.  Dr. Jens Som is her primary cardiologist.  She recently was hospitalized with chest discomfort and this was ultimately felt to not be related to ischemic heart disease with minimal troponin elevation.  While she was hospitalized, she had a CT scan with contrast and developed contrast nephropathy with her creatinine increasing from 1.2 up to 2.0, now back down to 1.33 on her last labs.  The patient has had a large pericardial effusion that has been followed over time with serial imaging.  The majority of the effusion is over on the posterolateral side of the heart and has not been felt to be easily accessible for needle pericardiocentesis.  Her last right heart catheterization at [facility] was in February of this year.  This demonstrated a mean PA pressure of 32 mmHg, pulmonary capillary wedge pressure of 21 mmHg, cardiac output of 6 L/min, and PVR of 1.8 Wood units.  Right atrial pressure was normal at 7 mmHg and not felt to be contributing to her pleural effusion.  The patient is here with her husband today.  She has had a longstanding heart murmur.  Her children live in [place] and [place], [place].  The patient  remains functionally independent but she has slowed down over the past few years.  She wears nocturnal oxygen.  She has a hard time getting to [facility] for any evaluations because of the distance involved.  She prefers to be cared for in [place] as much as possible just because of travel issues.  The patient denies orthopnea or PND, but she has poor sleep.  She does have occasional pressure in her chest and she is limited by exertional dyspnea.  She has to stop and rest frequently with light housework such as cooking.  She essentially reports functional class III symptoms.  Past Medical History:  Diagnosis Date   Anemia    Angiodysplasia of stomach and duodenum    Arthus phenomenon    AVM (arteriovenous malformation)    Blood transfusion without reported diagnosis    last 4 units 12-22-15, Iron infusion x2 last -01-07-16,01-14-16.   Breast cancer (HCC) 1989   Left   Candida esophagitis (HCC)    Cataract    Chronic kidney disease    Chronic mild renal insuffiency   CREST syndrome (HCC)    GERD (gastroesophageal reflux disease)    w/ HH   Interstitial lung disease (HCC)    Nodule of kidney    Pericardial effusion    PONV (postoperative nausea and vomiting)    Pulmonary embolus (HCC) 2003   Pulmonary hypertension (HCC)    followed by Dr Bland Span at [facility], now Dr. Penni Bombard visit end 2'17.   Rectal incontinence  Renal cell carcinoma (HCC)    Scleroderma (HCC)    Tubular adenoma of colon    Uterine polyp     Past Surgical History:  Procedure Laterality Date   APPENDECTOMY  1962   BREAST LUMPECTOMY  1989   left   CATARACT EXTRACTION, BILATERAL Bilateral 12/2013   COLONOSCOPY WITH PROPOFOL N/A 04/15/2021   Procedure: COLONOSCOPY WITH PROPOFOL;  Surgeon: Rachael Fee, MD;  Location: [facility];  Service: Endoscopy;  Laterality: N/A;   ENTEROSCOPY N/A 01/18/2016   Procedure: ENTEROSCOPY;  Surgeon: Napoleon Form, MD;  Location: [facility];  Service: Endoscopy;  Laterality:  N/A;   ENTEROSCOPY N/A 05/24/2018   Procedure: ENTEROSCOPY;  Surgeon: Lynann Bologna, MD;  Location: [facility];  Service: Endoscopy;  Laterality: N/A;   ENTEROSCOPY N/A 03/29/2021   Procedure: ENTEROSCOPY;  Surgeon: Beverley Fiedler, MD;  Location: [facility];  Service: Gastroenterology;  Laterality: N/A;   ENTEROSCOPY N/A 04/13/2021   Procedure: ENTEROSCOPY;  Surgeon: Benancio Deeds, MD;  Location: [facility];  Service: Gastroenterology;  Laterality: N/A;   ESOPHAGOGASTRODUODENOSCOPY (EGD) WITH PROPOFOL N/A 12/21/2015   Procedure: ESOPHAGOGASTRODUODENOSCOPY (EGD) WITH PROPOFOL;  Surgeon: Hilarie Fredrickson, MD;  Location: [facility];  Service: Endoscopy;  Laterality: N/A;   HOT HEMOSTASIS N/A 05/24/2018   Procedure: HOT HEMOSTASIS (ARGON PLASMA COAGULATION/BICAP);  Surgeon: Lynann Bologna, MD;  Location: [facility];  Service: Endoscopy;  Laterality: N/A;   HOT HEMOSTASIS N/A 03/29/2021   Procedure: HOT HEMOSTASIS (ARGON PLASMA COAGULATION/BICAP);  Surgeon: Beverley Fiedler, MD;  Location: [facility];  Service: Gastroenterology;  Laterality: N/A;   HOT HEMOSTASIS N/A 04/13/2021   Procedure: HOT HEMOSTASIS (ARGON PLASMA COAGULATION/BICAP);  Surgeon: Benancio Deeds, MD;  Location: [facility];  Service: Gastroenterology;  Laterality: N/A;   IR GENERIC HISTORICAL  06/05/2016   IR RADIOLOGIST EVAL & MGMT 06/05/2016 [name], MD [facility] INTERV RAD   IVC Filter     KIDNEY SURGERY     left -"laser surgery by Dr. Fredia Sorrow- 5 yrs ago" no removal   SCHLEROTHERAPY  05/24/2018   Procedure: SCHLEROTHERAPY;  Surgeon: Lynann Bologna, MD;  Location: [facility];  Service: Endoscopy;;   SUBMUCOSAL TATTOO INJECTION  04/13/2021   Procedure: SUBMUCOSAL TATTOO INJECTION;  Surgeon: Benancio Deeds, MD;  Location: [facility];  Service: Gastroenterology;;   TONSILLECTOMY     TUBAL LIGATION      Current Medications: Current Meds  Medication Sig   acetaminophen (TYLENOL) 325 MG tablet Take 650 mg by  mouth every 6 (six) hours as needed for mild pain.    albuterol (VENTOLIN HFA) 108 (90 Base) MCG/ACT inhaler INHALE 2 PUFFS INTO THE LUNGS EVERY 6 HOURS AS NEEDED FOR WHEEZING OR SHORTNESS OF BREATH (Patient taking differently: Inhale 2 puffs into the lungs every 6 (six) hours as needed for wheezing or shortness of breath.)   ALPRAZolam (XANAX) 0.25 MG tablet Take 0.5 tablets (0.125 mg total) by mouth at bedtime as needed. (Patient taking differently: Take 0.125 mg by mouth at bedtime as needed for anxiety or sleep.)   chlorhexidine (PERIDEX) 0.12 % solution 15 mLs by Mouth Rinse route 2 (two) times daily as needed (sore mouth).   Cyanocobalamin (VITAMIN B-12 PO) Take 1 tablet by mouth daily. Unsure of dose   diltiazem (CARDIZEM CD) 120 MG 24 hr capsule Take 120 mg by mouth daily.   fluticasone (FLONASE) 50 MCG/ACT nasal spray INSTILL 1 SPRAY INTO EACH NOSTRIL TWICE A DAY IF NEEDED (Patient taking differently: Place 1 spray into  both nostrils daily as needed for allergies.)   furosemide (LASIX) 20 MG tablet Take 1 tablet by mouth once daily   guaiFENesin (MUCINEX) 600 MG 12 hr tablet Take 150 mg by mouth daily.   magic mouthwash SOLN Take 10 mLs by mouth 4 (four) times daily as needed for mouth pain.   montelukast (SINGULAIR) 10 MG tablet TAKE 1 TABLET BY MOUTH AT BEDTIME (Patient taking differently: Take 10 mg by mouth at bedtime as needed (allergies).)   Multiple Vitamins-Iron (MULTIVITAMIN/IRON PO) Take 1 tablet by mouth daily.   Multiple Vitamins-Minerals (ZINC PO) Take 1 tablet by mouth daily. Unsure of dose   OPSUMIT 10 MG TABS Take 10 mg by mouth daily.   OXYGEN Inhale into the lungs. 2 LMP at night and upon exertion   pantoprazole (PROTONIX) 40 MG tablet Take 1 tablet (40 mg total) by mouth 2 (two) times daily before a meal.   Pyridoxine HCl (VITAMIN B6 PO) Take 1 tablet by mouth as needed. Unsure of dose   spironolactone (ALDACTONE) 25 MG tablet Take 12.5 mg by mouth daily.   sucralfate  (CARAFATE) 1 GM/10ML suspension Take 1 g by mouth every 6 (six) hours as needed (reflux).     Allergies:   Codeine, Other, Erythromycin, Lisinopril, Mycophenolate mofetil, and Sulfa antibiotics   Social History   Socioeconomic History   Marital status: Married    Spouse name: Not on file   Number of children: 2   Years of education: Not on file   Highest education level: Not on file  Occupational History   Occupation: Retired  Tobacco Use   Smoking status: Never   Smokeless tobacco: Never  Vaping Use   Vaping Use: Never used  Substance and Sexual Activity   Alcohol use: No   Drug use: No   Sexual activity: Not Currently  Other Topics Concern   Not on file  Social History Narrative   Married '611 son- '65, 1 daughter '63; 6 children (2 adopted)SO- SOBRetirement- doing well. Marriage in good health. Patient has never smoked. Alcohol use- noPt gets regular exercise   Social Determinants of Health   Financial Resource Strain: Low Risk  (04/02/2022)   Overall Financial Resource Strain (CARDIA)    Difficulty of Paying Living Expenses: Not hard at all  Food Insecurity: No Food Insecurity (04/02/2022)   Hunger Vital Sign    Worried About Running Out of Food in the Last Year: Never true    Ran Out of Food in the Last Year: Never true  Transportation Needs: No Transportation Needs (04/02/2022)   PRAPARE - Administrator, Civil Service (Medical): No    Lack of Transportation (Non-Medical): No  Physical Activity: Inactive (04/02/2022)   Exercise Vital Sign    Days of Exercise per Week: 0 days    Minutes of Exercise per Session: 0 min  Stress: No Stress Concern Present (04/02/2022)   [employer] of Occupational Health - Occupational Stress Questionnaire    Feeling of Stress : Not at all  Social Connections: Socially Integrated (04/02/2022)   Social Connection and Isolation Panel [NHANES]    Frequency of Communication with Friends and Family: More than three times a  week    Frequency of Social Gatherings with Friends and Family: More than three times a week    Attends Religious Services: More than 4 times per year    Active Member of [employer] or Organizations: Yes    Attends Banker Meetings: More than 4  times per year    Marital Status: Married     Family History: The patient's family history includes Alzheimer's disease in her mother; Bladder Cancer in her father; Diabetes in her father and sister; Esophageal cancer in her paternal uncle; Lung cancer in her sister; Prostate cancer in her father. There is no history of Colon cancer.  ROS:   Please see the history of present illness.    All other systems reviewed and are negative.  EKGs/Labs/Other Studies Reviewed:    The following studies were reviewed today: Echo 03/18/22:  1. Left ventricular ejection fraction, by estimation, is 55 to 60%. The  left ventricle has normal function. The left ventricle has no regional  wall motion abnormalities. There is moderate left ventricular hypertrophy.  Left ventricular diastolic  parameters are consistent with Grade I diastolic dysfunction (impaired  relaxation). Elevated left atrial pressure.   2. Right ventricular systolic function is normal. The right ventricular  size is normal.   3. Left atrial size was severely dilated.   4. Large pericardial effusion surrounds heart Most prominent along  lateral side of LV 24 mm in maximal dimension. There is no evidence by  echo criteria for hemodynamic compromise. Mitral inflow shows no  signficant respiratory variation, there is no RV  collapse, IVC is normal size. . Large pleural effusion.   5. Signficant mitral annular calcification. Mitral leaflets are thickened  with moderately restricted motion. Peak and mean gradients through the  valve are 12 and 7 mm Hg respectively. (HR =91 bpm). The mitral valve is  abnormal. Trivial mitral valve  regurgitation. No evidence of mitral stenosis. Moderate to  severe mitral  annular calcification.   6. AV is thickened, calcified with restricted motion. Peak and mean  gradients through the valve are 45 and 25 mm Hg respectively Dimensionless  index is 0.26. Overall consistent with moderate AS. Compared to echo  report from Jan 2023 [facility]), no  significant change. . The aortic valve is tricuspid. Aortic valve  regurgitation is not visualized.   Recent Labs: 03/17/2022: B Natriuretic Peptide 2,025.3 03/18/2022: Hemoglobin 11.5; Magnesium 1.9; Platelets 244 03/31/2022: ALT 12; BUN 39; Creatinine, Ser 1.33; Potassium 4.4; Sodium 137  Recent Lipid Panel    Component Value Date/Time   CHOL 158 06/24/2018 1004   TRIG 112.0 06/24/2018 1004   HDL 57.50 06/24/2018 1004   CHOLHDL 3 06/24/2018 1004   VLDL 22.4 06/24/2018 1004   LDLCALC 78 06/24/2018 1004     Risk Assessment/Calculations:           Physical Exam:    VS:  BP (!) 112/50   Pulse 83   Ht 5\' 3"  (1.6 m)   Wt 106 lb (48.1 kg)   SpO2 95%   BMI 18.78 kg/m     Wt Readings from Last 3 Encounters:  04/08/22 106 lb (48.1 kg)  04/03/22 105 lb 9.6 oz (47.9 kg)  03/31/22 105 lb (47.6 kg)     GEN:  Well nourished, well developed pleasant elderly woman in no acute distress HEENT: Normal NECK: No JVD; No carotid bruits LYMPHATICS: No lymphadenopathy CARDIAC: RRR, 3/6 harsh crescendo decrescendo murmur at the RUSB, mid-peaking RESPIRATORY:  bibasilar crackles are present ABDOMEN: Soft, non-tender, non-distended MUSCULOSKELETAL:  No edema; No deformity  SKIN: Warm and dry NEUROLOGIC:  Alert and oriented x 3 PSYCHIATRIC:  Normal affect   ASSESSMENT:    1. Nonrheumatic aortic valve stenosis   2. Pericardial effusion   3. Pulmonary hypertension (HCC)  PLAN:    In order of problems listed above:  I have personally reviewed the patient's echo study.  She has preserved LV function.  The aortic valve is degenerated with calcification and leaflet restriction noted.  Peak and mean  transvalvular gradients vary greatly depending on the RR interval, but the best gradient measurements ICE demonstrated a mean gradient of 25 mmHg and peak gradient of 45 mmHg, dimensionless index of 0.26, and stroke-volume index of 34.  She also has moderate mitral stenosis with peak and mean transmitral gradients of 12 and 7 mmHg, respectively.  There is a large pericardial effusion mostly located posterior and lateral to the heart.  The patient understands that she has a complex clinical situation with at least moderate aortic stenosis, chronic large pericardial effusion that is not easily accessible by needle pericardiocentesis, and presence of scleroderma with pulmonary hypertension.  She had a recent admission for chest pain as outlined above and she is experiencing episodic chest pressure.  Unfortunately, she had contrast nephropathy related to CT angiogram performed during her hospitalization.  We discussed the natural history of aortic stenosis today in light of her specific medical problems.  We discussed potential treatment options including surgical aortic valve replacement, transcatheter aortic valve replacement, and ongoing medical therapy with clinical surveillance.  I agree that a right and left heart catheterization is indicated to better assess the patient's hemodynamics, degree of aortic stenosis, and coronary anatomy.  We reviewed the potential risks of diagnostic catheterization and specifically the risk of contrast nephropathy.  She will be hydrated before the procedure and I would plan on watching her overnight with fluid hydration afterwards depending on her hemodynamic data. I have reviewed the risks, indications, and alternatives to cardiac catheterization, possible angioplasty, and stenting with the patient. Risks include but are not limited to bleeding, infection, vascular injury, stroke, myocardial infection, arrhythmia, kidney injury, radiation-related injury in the case of prolonged  fluoroscopy use, emergency cardiac surgery, and death. The patient understands the risks of serious complication is 1-2 in 1000 with diagnostic cardiac cath and 1-2% or less with angioplasty/stenting.  I would make further plans regarding her aortic stenosis pending her cardiac catheterization results.  If the catheterization clearly demonstrates moderate aortic stenosis, I will likely recommend continued echo surveillance and clinical follow-up.  However, if she is found to have severe aortic stenosis, will need to have further discussions regarding continued testing with CT angiography studies of the heart as well as the chest, abdomen, and pelvis, and surgical referral as part of a multidisciplinary approach to her aortic stenosis.      Shared Decision Making/Informed Consent{ All outpatient stress tests require an informed consent ([id number]) ATTESTATION ORDER       :[id number]} The risks [stroke (1 in 1000), death (1 in 1000), kidney failure [usually temporary] (1 in 500), bleeding (1 in 200), allergic reaction [possibly serious] (1 in 200)], benefits (diagnostic support and management of coronary artery disease) and alternatives of a cardiac catheterization were discussed in detail with Ms. Bonsall and she is willing to proceed.    Medication Adjustments/Labs and Tests Ordered: Current medicines are reviewed at length with the patient today.  Concerns regarding medicines are outlined above.  No orders of the defined types were placed in this encounter.  No orders of the defined types were placed in this encounter.   There are no Patient Instructions on file for this visit.   Signed, Tonny Bollman, MD  04/08/2022 12:31 PM    [facility]

## 2022-04-08 NOTE — Progress Notes (Unsigned)
Pre Surgical Assessment: 5 M Walk Test  33M=16.47f  5 Meter Walk Test- trial 1: 7.39 seconds 5 Meter Walk Test- trial 2: 7.12 seconds 5 Meter Walk Test- trial 3: 6.66 seconds 5 Meter Walk Test Average: 7.1 seconds

## 2022-04-11 ENCOUNTER — Ambulatory Visit: Payer: Medicare PPO | Admitting: Pulmonary Disease

## 2022-04-11 ENCOUNTER — Ambulatory Visit (HOSPITAL_COMMUNITY)
Admission: RE | Admit: 2022-04-11 | Discharge: 2022-04-11 | Disposition: A | Discharge status: Home / Self Care | Payer: Medicare PPO | Source: Ambulatory Visit | Attending: Nephrology | Admitting: Nephrology

## 2022-04-11 VITALS — BP 127/45 | HR 76 | Temp 97.5°F | Resp 18

## 2022-04-11 DIAGNOSIS — N1832 Chronic kidney disease, stage 3b: Secondary | ICD-10-CM | POA: Diagnosis not present

## 2022-04-11 DIAGNOSIS — D638 Anemia in other chronic diseases classified elsewhere: Secondary | ICD-10-CM | POA: Diagnosis not present

## 2022-04-11 LAB — IRON AND TIBC
Iron: 84 ug/dL (ref 28–170)
Saturation Ratios: 34 % — ABNORMAL HIGH (ref 10.4–31.8)
TIBC: 244 ug/dL — ABNORMAL LOW (ref 250–450)
UIBC: 160 ug/dL

## 2022-04-11 LAB — POCT HEMOGLOBIN-HEMACUE: Hemoglobin: 10.1 g/dL — ABNORMAL LOW (ref 12.0–15.0)

## 2022-04-11 LAB — FERRITIN: Ferritin: 1109 ng/mL — ABNORMAL HIGH (ref 11–307)

## 2022-04-11 MED ORDER — EPOETIN ALFA-EPBX 10000 UNIT/ML IJ SOLN
10000.0000 [IU] | INTRAMUSCULAR | Status: DC
  Administered 2022-04-11: 10000 [IU] via SUBCUTANEOUS

## 2022-04-11 MED ORDER — EPOETIN ALFA-EPBX 10000 UNIT/ML IJ SOLN
INTRAMUSCULAR | Status: AC
  Filled 2022-04-11: qty 1

## 2022-04-16 ENCOUNTER — Telehealth: Payer: Self-pay

## 2022-04-16 NOTE — Telephone Encounter (Signed)
Paula Ward is calling to report the drug interaction between ALPRAZolam (XANAX) 0.25 MG tablet with diltiazem (CARDIZEM CD) 120 MG 24 hr capsule.  FYI

## 2022-04-16 NOTE — Telephone Encounter (Signed)
Okay to continue both

## 2022-04-21 ENCOUNTER — Other Ambulatory Visit: Payer: Medicare PPO | Admitting: *Deleted

## 2022-04-21 DIAGNOSIS — I350 Nonrheumatic aortic (valve) stenosis: Secondary | ICD-10-CM | POA: Diagnosis not present

## 2022-04-21 DIAGNOSIS — Z01810 Encounter for preprocedural cardiovascular examination: Secondary | ICD-10-CM

## 2022-04-21 LAB — CBC
Hematocrit: 32.7 % — ABNORMAL LOW (ref 34.0–46.6)
Hemoglobin: 11.2 g/dL (ref 11.1–15.9)
MCH: 35.6 pg — ABNORMAL HIGH (ref 26.6–33.0)
MCHC: 34.3 g/dL (ref 31.5–35.7)
MCV: 104 fL — ABNORMAL HIGH (ref 79–97)
Platelets: 242 10*3/uL (ref 150–450)
RBC: 3.15 x10E6/uL — ABNORMAL LOW (ref 3.77–5.28)
RDW: 13.1 % (ref 11.7–15.4)
WBC: 7.8 10*3/uL (ref 3.4–10.8)

## 2022-04-21 LAB — BASIC METABOLIC PANEL
BUN/Creatinine Ratio: 28 (ref 12–28)
BUN: 35 mg/dL — ABNORMAL HIGH (ref 8–27)
CO2: 26 mmol/L (ref 20–29)
Calcium: 9.3 mg/dL (ref 8.7–10.3)
Chloride: 100 mmol/L (ref 96–106)
Creatinine, Ser: 1.26 mg/dL — ABNORMAL HIGH (ref 0.57–1.00)
Glucose: 91 mg/dL (ref 70–99)
Potassium: 4.5 mmol/L (ref 3.5–5.2)
Sodium: 139 mmol/L (ref 134–144)
eGFR: 42 mL/min/{1.73_m2} — ABNORMAL LOW (ref >59)

## 2022-04-23 ENCOUNTER — Ambulatory Visit
Admission: RE | Admit: 2022-04-23 | Discharge: 2022-04-23 | Disposition: A | Discharge status: Home / Self Care | Payer: Medicare PPO | Source: Ambulatory Visit | Attending: Emergency Medicine | Admitting: Emergency Medicine

## 2022-04-23 DIAGNOSIS — J189 Pneumonia, unspecified organism: Secondary | ICD-10-CM | POA: Diagnosis not present

## 2022-04-23 DIAGNOSIS — I3139 Other pericardial effusion (noninflammatory): Secondary | ICD-10-CM | POA: Diagnosis not present

## 2022-04-23 DIAGNOSIS — J90 Pleural effusion, not elsewhere classified: Secondary | ICD-10-CM | POA: Diagnosis not present

## 2022-04-23 DIAGNOSIS — I700 Atherosclerosis of aorta: Secondary | ICD-10-CM | POA: Diagnosis not present

## 2022-04-24 ENCOUNTER — Telehealth: Payer: Self-pay | Admitting: *Deleted

## 2022-04-24 NOTE — Telephone Encounter (Signed)
Cardiac Catheterization scheduled at [facility] for: Friday April 25, 2022 12 Noon Arrival time and place: [place] Entrance A at: 7-7:30 AM-pre-procedure hydration   Nothing to eat after midnight prior to procedure, clear liquids until 5 AM day of procedure.  Medication instructions: Hold:  Lasix/Spironolactone-day before and day of procedure-per protocol GFR 42 -Except hold medications usual morning medications can be taken with sips of water including aspirin 81 mg.  Confirmed patient has responsible adult to drive home post procedure and be with patient first 24 hours after arriving home.  Patient reports no new symptoms concerning for COVID-19 in the past 10 days.  Reviewed procedure instructions/discussed pre-procedure hydration with patient.

## 2022-04-25 ENCOUNTER — Observation Stay (HOSPITAL_COMMUNITY)
Admission: RE | Admit: 2022-04-25 | Discharge: 2022-04-26 | Disposition: A | Discharge status: Home / Self Care | Payer: Medicare PPO | Attending: Cardiovascular Disease | Admitting: Cardiovascular Disease

## 2022-04-25 ENCOUNTER — Other Ambulatory Visit: Payer: Self-pay

## 2022-04-25 ENCOUNTER — Encounter (HOSPITAL_COMMUNITY)
Admission: RE | Disposition: A | Discharge status: Home / Self Care | Payer: Medicare PPO | Source: Home / Self Care | Attending: Cardiovascular Disease

## 2022-04-25 DIAGNOSIS — I3139 Other pericardial effusion (noninflammatory): Secondary | ICD-10-CM | POA: Diagnosis present

## 2022-04-25 DIAGNOSIS — Z86711 Personal history of pulmonary embolism: Secondary | ICD-10-CM | POA: Diagnosis not present

## 2022-04-25 DIAGNOSIS — M349 Systemic sclerosis, unspecified: Secondary | ICD-10-CM | POA: Diagnosis present

## 2022-04-25 DIAGNOSIS — I350 Nonrheumatic aortic (valve) stenosis: Principal | ICD-10-CM | POA: Insufficient documentation

## 2022-04-25 DIAGNOSIS — Z853 Personal history of malignant neoplasm of breast: Secondary | ICD-10-CM | POA: Diagnosis not present

## 2022-04-25 DIAGNOSIS — Z85528 Personal history of other malignant neoplasm of kidney: Secondary | ICD-10-CM | POA: Insufficient documentation

## 2022-04-25 DIAGNOSIS — I3481 Nonrheumatic mitral (valve) annulus calcification: Secondary | ICD-10-CM | POA: Insufficient documentation

## 2022-04-25 DIAGNOSIS — N183 Chronic kidney disease, stage 3 unspecified: Secondary | ICD-10-CM | POA: Insufficient documentation

## 2022-04-25 DIAGNOSIS — I10 Essential (primary) hypertension: Secondary | ICD-10-CM | POA: Diagnosis present

## 2022-04-25 DIAGNOSIS — N1832 Chronic kidney disease, stage 3b: Secondary | ICD-10-CM | POA: Diagnosis present

## 2022-04-25 DIAGNOSIS — I272 Pulmonary hypertension, unspecified: Secondary | ICD-10-CM | POA: Insufficient documentation

## 2022-04-25 DIAGNOSIS — D509 Iron deficiency anemia, unspecified: Secondary | ICD-10-CM | POA: Diagnosis present

## 2022-04-25 DIAGNOSIS — I129 Hypertensive chronic kidney disease with stage 1 through stage 4 chronic kidney disease, or unspecified chronic kidney disease: Secondary | ICD-10-CM | POA: Insufficient documentation

## 2022-04-25 DIAGNOSIS — J90 Pleural effusion, not elsewhere classified: Secondary | ICD-10-CM | POA: Insufficient documentation

## 2022-04-25 DIAGNOSIS — Z95820 Peripheral vascular angioplasty status with implants and grafts: Secondary | ICD-10-CM | POA: Diagnosis not present

## 2022-04-25 DIAGNOSIS — I2729 Other secondary pulmonary hypertension: Secondary | ICD-10-CM | POA: Diagnosis present

## 2022-04-25 HISTORY — PX: RIGHT/LEFT HEART CATH AND CORONARY ANGIOGRAPHY: CATH118266

## 2022-04-25 LAB — POCT I-STAT 7, (LYTES, BLD GAS, ICA,H+H)
Acid-base deficit: 1 mmol/L (ref 0.0–2.0)
Bicarbonate: 24.8 mmol/L (ref 20.0–28.0)
Calcium, Ion: 1.24 mmol/L (ref 1.15–1.40)
HCT: 28 % — ABNORMAL LOW (ref 36.0–46.0)
Hemoglobin: 9.5 g/dL — ABNORMAL LOW (ref 12.0–15.0)
O2 Saturation: 92 %
Potassium: 3.5 mmol/L (ref 3.5–5.1)
Sodium: 141 mmol/L (ref 135–145)
TCO2: 26 mmol/L (ref 22–32)
pCO2 arterial: 44.2 mmHg (ref 32–48)
pH, Arterial: 7.357 (ref 7.35–7.45)
pO2, Arterial: 66 mmHg — ABNORMAL LOW (ref 83–108)

## 2022-04-25 LAB — HEPATIC FUNCTION PANEL
ALT: 10 U/L (ref 0–44)
AST: 19 U/L (ref 15–41)
Albumin: 2.8 g/dL — ABNORMAL LOW (ref 3.5–5.0)
Alkaline Phosphatase: 37 U/L — ABNORMAL LOW (ref 38–126)
Bilirubin, Direct: 0.1 mg/dL (ref 0.0–0.2)
Indirect Bilirubin: 0.3 mg/dL (ref 0.3–0.9)
Total Bilirubin: 0.4 mg/dL (ref 0.3–1.2)
Total Protein: 5 g/dL — ABNORMAL LOW (ref 6.5–8.1)

## 2022-04-25 LAB — POCT I-STAT EG7
Acid-Base Excess: 0 mmol/L (ref 0.0–2.0)
Bicarbonate: 25.7 mmol/L (ref 20.0–28.0)
Calcium, Ion: 1.25 mmol/L (ref 1.15–1.40)
HCT: 29 % — ABNORMAL LOW (ref 36.0–46.0)
Hemoglobin: 9.9 g/dL — ABNORMAL LOW (ref 12.0–15.0)
O2 Saturation: 66 %
Potassium: 3.5 mmol/L (ref 3.5–5.1)
Sodium: 142 mmol/L (ref 135–145)
TCO2: 27 mmol/L (ref 22–32)
pCO2, Ven: 43 mmHg — ABNORMAL LOW (ref 44–60)
pH, Ven: 7.384 (ref 7.25–7.43)
pO2, Ven: 35 mmHg (ref 32–45)

## 2022-04-25 SURGERY — RIGHT/LEFT HEART CATH AND CORONARY ANGIOGRAPHY
Anesthesia: LOCAL

## 2022-04-25 MED ORDER — MIDAZOLAM HCL 2 MG/2ML IJ SOLN
INTRAMUSCULAR | Status: AC
  Filled 2022-04-25: qty 2

## 2022-04-25 MED ORDER — MIDAZOLAM HCL 2 MG/2ML IJ SOLN
INTRAMUSCULAR | Status: DC | PRN
  Administered 2022-04-25: 1 mg via INTRAVENOUS

## 2022-04-25 MED ORDER — IOHEXOL 350 MG/ML SOLN
INTRAVENOUS | Status: DC | PRN
  Administered 2022-04-25: 45 mL

## 2022-04-25 MED ORDER — SODIUM CHLORIDE 0.9 % WEIGHT BASED INFUSION: 1.0000 mL/kg/h | INTRAVENOUS | Status: DC

## 2022-04-25 MED ORDER — SODIUM CHLORIDE 0.9% FLUSH: 3.0000 mL | INTRAVENOUS | Status: DC | PRN

## 2022-04-25 MED ORDER — SODIUM CHLORIDE 0.9 % WEIGHT BASED INFUSION
3.0000 mL/kg/h | INTRAVENOUS | Status: DC
Start: 2022-04-25 — End: 2022-04-25
  Administered 2022-04-25: 3 mL/kg/h via INTRAVENOUS

## 2022-04-25 MED ORDER — PANTOPRAZOLE SODIUM 40 MG PO TBEC
40.0000 mg | DELAYED_RELEASE_TABLET | Freq: Two times a day (BID) | ORAL | Status: DC
  Administered 2022-04-25 – 2022-04-26 (×2): 40 mg via ORAL
  Filled 2022-04-25 (×2): qty 1

## 2022-04-25 MED ORDER — SPIRONOLACTONE 12.5 MG HALF TABLET
12.5000 mg | ORAL_TABLET | Freq: Every day | ORAL | Status: DC
  Administered 2022-04-26: 12.5 mg via ORAL
  Filled 2022-04-25: qty 1

## 2022-04-25 MED ORDER — LIDOCAINE HCL (PF) 1 % IJ SOLN
INTRAMUSCULAR | Status: DC | PRN
  Administered 2022-04-25 (×2): 2 mL

## 2022-04-25 MED ORDER — SODIUM CHLORIDE 0.9 % WEIGHT BASED INFUSION
1.0000 mL/kg/h | INTRAVENOUS | Status: AC
Start: 2022-04-25 — End: 2022-04-25

## 2022-04-25 MED ORDER — ACETAMINOPHEN 325 MG PO TABS: 650.0000 mg | ORAL_TABLET | ORAL | Status: DC | PRN

## 2022-04-25 MED ORDER — SODIUM CHLORIDE 0.9 % IV SOLN: 250.0000 mL | INTRAVENOUS | Status: DC | PRN

## 2022-04-25 MED ORDER — FENTANYL CITRATE (PF) 100 MCG/2ML IJ SOLN
INTRAMUSCULAR | Status: DC | PRN
  Administered 2022-04-25: 25 ug via INTRAVENOUS

## 2022-04-25 MED ORDER — MAGIC MOUTHWASH: 10.0000 mL | Freq: Four times a day (QID) | ORAL | Status: DC | PRN

## 2022-04-25 MED ORDER — HEPARIN (PORCINE) IN NACL 1000-0.9 UT/500ML-% IV SOLN
INTRAVENOUS | Status: DC | PRN
  Administered 2022-04-25 (×2): 500 mL

## 2022-04-25 MED ORDER — MACITENTAN 10 MG PO TABS
10.0000 mg | ORAL_TABLET | Freq: Every day | ORAL | Status: DC
  Administered 2022-04-26: 10 mg via ORAL
  Filled 2022-04-25: qty 1

## 2022-04-25 MED ORDER — ASPIRIN 81 MG PO CHEW: 81.0000 mg | CHEWABLE_TABLET | ORAL | Status: DC

## 2022-04-25 MED ORDER — VERAPAMIL HCL 2.5 MG/ML IV SOLN
INTRAVENOUS | Status: AC
  Filled 2022-04-25: qty 2

## 2022-04-25 MED ORDER — ALPRAZOLAM 0.25 MG PO TABS: 0.1250 mg | ORAL_TABLET | Freq: Every evening | ORAL | Status: DC | PRN

## 2022-04-25 MED ORDER — CHLORHEXIDINE GLUCONATE 0.12 % MT SOLN: 15.0000 mL | Freq: Two times a day (BID) | OROMUCOSAL | Status: DC | PRN

## 2022-04-25 MED ORDER — HEPARIN SODIUM (PORCINE) 1000 UNIT/ML IJ SOLN
INTRAMUSCULAR | Status: DC | PRN
  Administered 2022-04-25: 3000 [IU] via INTRAVENOUS

## 2022-04-25 MED ORDER — FENTANYL CITRATE (PF) 100 MCG/2ML IJ SOLN
INTRAMUSCULAR | Status: AC
  Filled 2022-04-25: qty 2

## 2022-04-25 MED ORDER — HYDRALAZINE HCL 20 MG/ML IJ SOLN: 10.0000 mg | INTRAMUSCULAR | Status: AC | PRN

## 2022-04-25 MED ORDER — SODIUM CHLORIDE 0.9% FLUSH: 3.0000 mL | Freq: Two times a day (BID) | INTRAVENOUS | Status: DC

## 2022-04-25 MED ORDER — FUROSEMIDE 20 MG PO TABS
20.0000 mg | ORAL_TABLET | Freq: Every day | ORAL | Status: DC
  Administered 2022-04-26: 20 mg via ORAL
  Filled 2022-04-25: qty 1

## 2022-04-25 MED ORDER — FLUTICASONE PROPIONATE 50 MCG/ACT NA SUSP
1.0000 | Freq: Every day | NASAL | Status: DC | PRN
Start: 2022-04-25 — End: 2022-04-26

## 2022-04-25 MED ORDER — LIDOCAINE HCL (PF) 1 % IJ SOLN
INTRAMUSCULAR | Status: AC
  Filled 2022-04-25: qty 30

## 2022-04-25 MED ORDER — HEPARIN SODIUM (PORCINE) 1000 UNIT/ML IJ SOLN
INTRAMUSCULAR | Status: AC
  Filled 2022-04-25: qty 10

## 2022-04-25 MED ORDER — HEPARIN (PORCINE) IN NACL 1000-0.9 UT/500ML-% IV SOLN
INTRAVENOUS | Status: AC
  Filled 2022-04-25: qty 1000

## 2022-04-25 MED ORDER — SODIUM CHLORIDE 0.9% FLUSH
3.0000 mL | Freq: Two times a day (BID) | INTRAVENOUS | Status: DC
  Administered 2022-04-26: 3 mL via INTRAVENOUS

## 2022-04-25 MED ORDER — LABETALOL HCL 5 MG/ML IV SOLN
10.0000 mg | INTRAVENOUS | Status: AC | PRN
Start: 2022-04-25 — End: 2022-04-25

## 2022-04-25 MED ORDER — SUCRALFATE 1 GM/10ML PO SUSP: 1.0000 g | Freq: Four times a day (QID) | ORAL | Status: DC | PRN

## 2022-04-25 MED ORDER — DILTIAZEM HCL ER COATED BEADS 120 MG PO CP24
120.0000 mg | ORAL_CAPSULE | Freq: Every day | ORAL | Status: DC
  Administered 2022-04-26: 120 mg via ORAL
  Filled 2022-04-25: qty 1

## 2022-04-25 MED ORDER — ONDANSETRON HCL 4 MG/2ML IJ SOLN: 4.0000 mg | Freq: Four times a day (QID) | INTRAMUSCULAR | Status: DC | PRN

## 2022-04-25 MED ORDER — VERAPAMIL HCL 2.5 MG/ML IV SOLN: INTRAVENOUS | Status: DC | PRN

## 2022-04-25 MED ORDER — ACETAMINOPHEN 325 MG PO TABS: 650.0000 mg | ORAL_TABLET | Freq: Four times a day (QID) | ORAL | Status: DC | PRN

## 2022-04-25 MED ORDER — MONTELUKAST SODIUM 10 MG PO TABS
10.0000 mg | ORAL_TABLET | Freq: Every day | ORAL | Status: DC
  Administered 2022-04-25: 10 mg via ORAL
  Filled 2022-04-25: qty 1

## 2022-04-25 MED ORDER — TRIAMCINOLONE ACETONIDE 0.1 % MT PSTE: 1.0000 | PASTE | Freq: Three times a day (TID) | OROMUCOSAL | Status: DC | PRN

## 2022-04-25 MED ORDER — MACITENTAN 10 MG PO TABS: 10.0000 mg | ORAL_TABLET | Freq: Every day | ORAL | Status: DC

## 2022-04-25 SURGICAL SUPPLY — 17 items
BAND CMPR LRG ZPHR (HEMOSTASIS) ×1
BAND ZEPHYR COMPRESS 30 LONG (HEMOSTASIS) ×1 IMPLANT
CATH 5FR JL3.5 JR4 ANG PIG MP (CATHETERS) ×1 IMPLANT
CATH BALLN WEDGE 5F 110CM (CATHETERS) ×1 IMPLANT
CATH INFINITI 5FR AL1 (CATHETERS) ×1 IMPLANT
GLIDESHEATH SLEND SS 6F .021 (SHEATH) ×1 IMPLANT
GUIDEWIRE .025 260CM (WIRE) ×1 IMPLANT
GUIDEWIRE INQWIRE 1.5J.035X260 (WIRE) IMPLANT
INQWIRE 1.5J .035X260CM (WIRE) ×2
KIT HEART LEFT (KITS) ×3 IMPLANT
PACK CARDIAC CATHETERIZATION (CUSTOM PROCEDURE TRAY) ×3 IMPLANT
SHEATH GLIDE SLENDER 4/5FR (SHEATH) ×1 IMPLANT
TRANSDUCER W/STOPCOCK (MISCELLANEOUS) ×4 IMPLANT
TUBING ART PRESS 72  MALE/FEM (TUBING) ×2
TUBING ART PRESS 72 MALE/FEM (TUBING) IMPLANT
TUBING CIL FLEX 10 FLL-RA (TUBING) ×3 IMPLANT
WIRE EMERALD ST .035X260CM (WIRE) ×1 IMPLANT

## 2022-04-25 NOTE — Progress Notes (Signed)
Report given to [place] on 3E. Pt stable and ready for transport to room.

## 2022-04-25 NOTE — Progress Notes (Signed)
Pharmacy Consult for Pulmonary Hypertension Treatment   Indication - Continuation of prior to admission medication   Patient is 86 y.o.  with history of PAH on chronic Macitentan (Opsumit) PTA and will be continued while hospitalized.   Continuing this medication order as an inpatient requires that monitoring parameters per REMS requirements must be met.  Chronic therapy is under the supervision of Dr. Rich Reining who is enrolled in the REMS program and is being notified of continuation of therapy. A staff message in EPIC has been sent notifying the certified prescriber.  Per patient report has previously been educated on Hepatotoxicity . On admission pregnancy risk has been assessed and no monitoring required. Hepatic function has been evaluated. AST/ALT appropriate to continue medication at this time.     Latest Ref Rng & Units 03/31/2022    1:57 PM 03/18/2022    6:53 PM 03/18/2022    1:47 AM  Hepatic Function  Total Protein 6.0 - 8.3 g/dL 6.7  6.7  6.7    6.7   Albumin 3.5 - 5.2 g/dL 3.8  3.3  3.4    3.4   AST 0 - 37 U/L _0 ALT 0 - 35 U/L _1 Alk Phosphatase 39 - 117 U/L 56  54  54    54   Total Bilirubin 0.2 - 1.2 mg/dL 0.6  0.3  0.8    0.6   Bilirubin, Direct 0.0 - 0.2 mg/dL  <0.1  0.2     If any question arise or pregnancy is identified during hospitalization, contact for bosentan and macitentan: [phone]; ambrisentan: [phone].  Thank for you allowing [place] to participate in the care of this patient.   Sherlon Handing, PharmD, BCPS Please see amion for complete clinical pharmacist phone list 04/25/2022 3:45 PM

## 2022-04-25 NOTE — Plan of Care (Signed)
  Problem: Education: Goal: Understanding of CV disease, CV risk reduction, and recovery process will improve Outcome: Progressing Goal: Individualized Educational Video(s) Outcome: Progressing   Problem: Activity: Goal: Ability to return to baseline activity level will improve Outcome: Progressing   Problem: Cardiovascular: Goal: Ability to achieve and maintain adequate cardiovascular perfusion will improve Outcome: Progressing   Problem: Health Behavior/Discharge Planning: Goal: Ability to safely manage health-related needs after discharge will improve Outcome: Progressing   Problem: Education: Goal: Knowledge of General Education information will improve Description: Including pain rating scale, medication(s)/side effects and non-pharmacologic comfort measures Outcome: Progressing   Problem: Health Behavior/Discharge Planning: Goal: Ability to manage health-related needs will improve Outcome: Progressing   Problem: Clinical Measurements: Goal: Ability to maintain clinical measurements within normal limits will improve Outcome: Progressing Goal: Will remain free from infection Outcome: Progressing Goal: Diagnostic test results will improve Outcome: Progressing Goal: Respiratory complications will improve Outcome: Progressing Goal: Cardiovascular complication will be avoided Outcome: Progressing   Problem: Activity: Goal: Risk for activity intolerance will decrease Outcome: Progressing   Problem: Nutrition: Goal: Adequate nutrition will be maintained Outcome: Progressing   Problem: Coping: Goal: Level of anxiety will decrease Outcome: Progressing   Problem: Elimination: Goal: Will not experience complications related to bowel motility Outcome: Progressing Goal: Will not experience complications related to urinary retention Outcome: Progressing   Problem: Pain Managment: Goal: General experience of comfort will improve Outcome: Progressing   Problem:  Safety: Goal: Ability to remain free from injury will improve Outcome: Progressing   Problem: Skin Integrity: Goal: Risk for impaired skin integrity will decrease Outcome: Progressing

## 2022-04-25 NOTE — Progress Notes (Signed)
Patient arrived to the unit at 1419.

## 2022-04-25 NOTE — Interval H&P Note (Signed)
History and Physical Interval Note:  04/25/2022 10:56 AM  Paula Ward  has presented today for surgery, with the diagnosis of aortic stenosis.  The various methods of treatment have been discussed with the patient and family. After consideration of risks, benefits and other options for treatment, the patient has consented to  Procedure(s): RIGHT/LEFT HEART CATH AND CORONARY ANGIOGRAPHY (N/A) as a surgical intervention.  The patient's history has been reviewed, patient examined, no change in status, stable for surgery.  I have reviewed the patient's chart and labs.  Questions were answered to the patient's satisfaction.     Sherren Mocha

## 2022-04-25 NOTE — TOC Progression Note (Signed)
Transition of Care [facility]) - Progression Note    Patient Details  Name: Paula Ward MRN: [id number] Date of Birth: 10-09-1936  Transition [facility]) CM/SW Contact  Zenon Mayo, RN Phone Number: 04/25/2022, 4:27 PM  Clinical Narrative:     From home, for dc today, has no needs.       Expected Discharge Plan and Services           Expected Discharge Date: 04/25/22                                     Social Determinants of Health (SDOH) Interventions    Readmission Risk Interventions    03/18/2022    3:45 PM  Readmission Risk Prevention Plan  Transportation Screening Complete  PCP or Specialist Appt within 3-5 Days Complete  HRI or [place] Complete  Social Work Consult for [place] Planning/Counseling Complete  Palliative Care Screening Not Applicable  Medication Review Press photographer) Complete

## 2022-04-26 DIAGNOSIS — Z85528 Personal history of other malignant neoplasm of kidney: Secondary | ICD-10-CM | POA: Diagnosis not present

## 2022-04-26 DIAGNOSIS — Z853 Personal history of malignant neoplasm of breast: Secondary | ICD-10-CM | POA: Diagnosis not present

## 2022-04-26 DIAGNOSIS — I350 Nonrheumatic aortic (valve) stenosis: Secondary | ICD-10-CM

## 2022-04-26 DIAGNOSIS — Z95820 Peripheral vascular angioplasty status with implants and grafts: Secondary | ICD-10-CM | POA: Diagnosis not present

## 2022-04-26 DIAGNOSIS — J90 Pleural effusion, not elsewhere classified: Secondary | ICD-10-CM | POA: Diagnosis not present

## 2022-04-26 DIAGNOSIS — Z86711 Personal history of pulmonary embolism: Secondary | ICD-10-CM | POA: Diagnosis not present

## 2022-04-26 DIAGNOSIS — I129 Hypertensive chronic kidney disease with stage 1 through stage 4 chronic kidney disease, or unspecified chronic kidney disease: Secondary | ICD-10-CM | POA: Diagnosis not present

## 2022-04-26 DIAGNOSIS — N183 Chronic kidney disease, stage 3 unspecified: Secondary | ICD-10-CM | POA: Diagnosis not present

## 2022-04-26 DIAGNOSIS — I272 Pulmonary hypertension, unspecified: Secondary | ICD-10-CM | POA: Diagnosis not present

## 2022-04-26 LAB — BASIC METABOLIC PANEL
Anion gap: 10 (ref 5–15)
BUN: 30 mg/dL — ABNORMAL HIGH (ref 8–23)
CO2: 21 mmol/L — ABNORMAL LOW (ref 22–32)
Calcium: 7.7 mg/dL — ABNORMAL LOW (ref 8.9–10.3)
Chloride: 111 mmol/L (ref 98–111)
Creatinine, Ser: 1.02 mg/dL — ABNORMAL HIGH (ref 0.44–1.00)
GFR, Estimated: 55 mL/min — ABNORMAL LOW (ref >60)
Glucose, Bld: 85 mg/dL (ref 70–99)
Potassium: 3.5 mmol/L (ref 3.5–5.1)
Sodium: 142 mmol/L (ref 135–145)

## 2022-04-26 LAB — CBC
HCT: 30.7 % — ABNORMAL LOW (ref 36.0–46.0)
Hemoglobin: 9.6 g/dL — ABNORMAL LOW (ref 12.0–15.0)
MCH: 34.9 pg — ABNORMAL HIGH (ref 26.0–34.0)
MCHC: 31.3 g/dL (ref 30.0–36.0)
MCV: 111.6 fL — ABNORMAL HIGH (ref 80.0–100.0)
Platelets: 171 10*3/uL (ref 150–400)
RBC: 2.75 MIL/uL — ABNORMAL LOW (ref 3.87–5.11)
RDW: 14.4 % (ref 11.5–15.5)
WBC: 6.6 10*3/uL (ref 4.0–10.5)
nRBC: 0 % (ref 0.0–0.2)

## 2022-04-26 NOTE — Discharge Summary (Addendum)
Discharge Summary    Patient ID: Paula Ward MRN: [id number]; DOB: 13-Sep-1936  Admit date: 04/25/2022 Discharge date: 04/26/2022  PCP:  Hoyt Koch, MD   [place] Providers Cardiologist:  Kirk Ruths, MD   { Valvular Heart: Dr. Burt Knack  Discharge Diagnoses    Principal Problem:   Aortic stenosis, moderate Active Problems:   Anemia, iron deficiency   Essential hypertension   Pulmonary hypertension (HCC)   SCLERODERMA   CKD (chronic kidney disease) stage 3, GFR 30-59 ml/min (HCC)   Pericardial effusion   Moderate aortic stenosis   Diagnostic Studies/Procedures    RIGHT/LEFT HEART CATH AND CORONARY ANGIOGRAPHY 04/25/22   Conclusion  1.  Ectatic but patent coronary arteries with no obstructive disease.  Right dominant coronary artery. 2.  Mild pulmonary hypertension with PA pressure 52/15, mean 28 mmHg, transpulmonary gradient 10 mmHg, PVR approximately 2 Wood units. 3.  Moderate aortic stenosis with mean transvalvular gradient 16 mmHg calculated aortic valve area 1.16 cm 4.  Heavy mitral annular calcification by plain fluoroscopy 5.  Mild aortic valve calcification by plain fluoroscopy   Recommend: Continued medical therapy, clinical follow-up, echo surveillance.  The patient does not appear to have severe aortic stenosis and I do not think Paula Ward would benefit from TAVR at this time.  Diagnostic Dominance: Right    History of Present Illness     Paula Ward is a 86 y.o. female with hx of moderate aortic stenosis, pulmonary hypertension 2nd to scleroderma, chronic pericardial effusion, chronic kidney disease, history of pulmonary embolus, gastrointestinal bleeding requiring IVC filter placement and breast cancer presented for outpatient cath.   Paula Ward is followed in the pulmonary hypertension clinic at [facility] as well.  Dr. Stanford Breed is her primary cardiologist.   Paula Ward recently was hospitalized with chest discomfort and this was ultimately felt to not be  related to ischemic heart disease with minimal troponin elevation.  While Paula Ward was hospitalized, Paula Ward had a CT scan with contrast and developed contrast nephropathy with her creatinine increasing from 1.2 up to 2.0, now back down. The patient has had a large pericardial effusion that has been followed over time with serial imaging.  The majority of the effusion is over on the posterolateral side of the heart and has not been felt to be easily accessible for needle pericardiocentesis.  Echo during admission 03/18/22 showed LVEF of 55-60% and grade 1 DD. Large pericardial effusion surrounds heart Most prominent along lateral side of LV 24 mm in maximal dimension. Moderate aortic stenosis with mean gradient of 57m Hg. Mitral valve leaflets are thickened with moderately restricted motion with peak and mean gradient 12 and 7 mm Hg respectively.   [facility]  This demonstrated a mean PA pressure of 32 mmHg, pulmonary capillary wedge pressure of 21 mmHg, cardiac output of 6 L/min, and PVR of 1.8 Wood units. Right atrial pressure was normal at 7 mmHg and not felt to be contributing to her pleural effusion.  Seen by Dr. CBurt Knack6/20/23 for evaluation of nonrheumatic aortic valve stenosis. Cath performed on 04/25/2022.  Hospital Course     Consultants: None  Cardiac cath showed no obstructive disease. Mild pulmonary hypertension with PA pressure 52/15, mean 28 mm Hg. Moderate aortic stenosis with mean transvalvular gradient 16 mm Hg. No complication. Per patient's request, Paula Ward was kept here overnight given prior hx of contrast nephropathy last admission. No overnight complications. Scr improved to 1.02. Hgb stable at  9.6. No hematoma at cath site.   Paula Ward is doing well without any complaints this morning.  Paula Ward did have some mild swelling in her right hand but this is resolving after splint is being taken off.  Thin alert and orient x3 3/6 systolic  murmur right upper sternal border.  Ambulating well.  Okay for discharge.  Paula Ward will follow-up with both Dr. Stanford Breed and Dr. Burt Knack.  Did the patient have an acute coronary syndrome (MI, NSTEMI, STEMI, etc) this admission?:  No                               Did the patient have a percutaneous coronary intervention (stent / angioplasty)?:  No.    Discharge Vitals Blood pressure (!) 132/53, pulse 76, temperature 97.9 F (36.6 C), temperature source Oral, resp. rate 16, height _0  (1.6 m), weight 48.1 kg, SpO2 92 %.  Filed Weights   04/25/22 0718  Weight: 48.1 kg   Physical Exam Constitutional:      Appearance: Normal appearance.  HENT:     Nose: Nose normal.  Eyes:     Extraocular Movements: Extraocular movements intact.     Pupils: Pupils are equal, round, and reactive to light.  Cardiovascular:     Rate and Rhythm: Normal rate and regular rhythm.     Heart sounds: Murmur heard.     Comments: Radial cath site without hematoma Pulmonary:     Effort: Pulmonary effort is normal.     Breath sounds: Normal breath sounds.  Abdominal:     General: Abdomen is flat.     Palpations: Abdomen is soft.  Musculoskeletal:        General: Normal range of motion.     Cervical back: Normal range of motion.  Skin:    General: Skin is warm and dry.  Neurological:     General: No focal deficit present.     Mental Status: Paula Ward is alert and oriented to person, place, and time.  Psychiatric:        Mood and Affect: Mood normal.        Behavior: Behavior normal.     Labs & Radiologic Studies    CBC Recent Labs    04/25/22 1127 04/26/22 0159  WBC  --  6.6  HGB 9.5* 9.6*  HCT 28.0* 30.7*  MCV  --  111.6*  PLT  --  329   Basic Metabolic Panel Recent Labs    04/25/22 1127 04/26/22 0159  NA 141 142  K 3.5 3.5  CL  --  111  CO2  --  21*  GLUCOSE  --  85  BUN  --  30*  CREATININE  --  1.02*  CALCIUM  --  7.7*   Liver Function Tests Recent Labs    04/25/22 1536  AST 19   ALT 10  ALKPHOS 37*  BILITOT 0.4  PROT 5.0*  ALBUMIN 2.8*    _____________  CARDIAC CATHETERIZATION  Result Date: 04/25/2022 1.  Ectatic but patent coronary arteries with no obstructive disease.  Right dominant coronary artery. 2.  Mild pulmonary hypertension with PA pressure 52/15, mean 28 mmHg, transpulmonary gradient 10 mmHg, PVR approximately 2 Wood units. 3.  Moderate aortic stenosis with mean transvalvular gradient 16 mmHg calculated aortic valve area 1.16 cm 4.  Heavy mitral annular calcification by plain fluoroscopy 5.  Mild aortic valve calcification by plain fluoroscopy Recommend: Continued medical therapy, clinical follow-up,  echo surveillance.  The patient does not appear to have severe aortic stenosis and I do not think Paula Ward would benefit from TAVR at this time.   CT Chest Wo Contrast  Result Date: 04/23/2022 CLINICAL DATA:  Pneumonia. Immunotherapy related toxicity. Pneumonitis. Congestion and cough. History of breast and renal cancer. * Tracking Code: BO * EXAM: CT CHEST WITHOUT CONTRAST TECHNIQUE: Multidetector CT imaging of the chest was performed following the standard protocol without IV contrast. RADIATION DOSE REDUCTION: This exam was performed according to the departmental dose-optimization program which includes automated exposure control, adjustment of the mA and/or kV according to patient size and/or use of iterative reconstruction technique. COMPARISON:  03/17/2022 FINDINGS: Cardiovascular: Heart is enlarged. Similar moderate to large pericardial effusion. There is moderate atherosclerotic calcification of the abdominal aorta without aneurysm. Enlargement of the pulmonary outflow tract/main pulmonary arteries suggests pulmonary arterial hypertension. Mediastinum/Nodes: Scattered upper normal mediastinal lymph nodes again noted. No evidence for gross hilar lymphadenopathy although assessment is limited by the lack of intravenous contrast on the current study. The esophagus has  normal imaging features. There is no axillary lymphadenopathy. Lungs/Pleura: The patchy and confluent areas of ground-glass opacity seen previously are markedly improved in some areas and resolved in others. Dependent consolidative airspace disease seen previously in the lower lobes has largely resolved in the interval. No new suspicious pulmonary nodule or mass. Subpleural reticulation and volume loss in both lungs is similar. Biapical pleuroparenchymal scarring evident. 7 mm right upper lobe nodule on 21/8 is stable back to 03/30/2021.Similar left-sided pleural effusion. Upper Abdomen: Unremarkable. Musculoskeletal: No worrisome lytic or sclerotic osseous abnormality. IMPRESSION: 1. Marked interval improvement in the patchy and confluent areas of ground-glass opacity seen previously with resolution in some regions. 2. Dependent consolidative airspace disease seen previously in the lower lobes has largely resolved in the interval. 3. Subpleural reticulation bilaterally is similar, compatible with underlying chronic interstitial lung disease. 4. Similar moderate to large pericardial effusion. 5. Enlargement of the pulmonary outflow tract/main pulmonary arteries suggests pulmonary arterial hypertension. 6. 7 mm right upper lobe pulmonary nodule is stable back to 03/30/2021. 7. Similar left-sided pleural effusion. 8. Aortic Atherosclerosis (ICD10-I70.0). Electronically Signed   By: Misty Stanley M.D.   On: 04/23/2022 11:52    Disposition   Pt is being discharged home today in good condition.  Follow-up Plans & Appointments     Follow-up Information     [facility], Crista Luria, [place] Follow up on 05/21/2022.   Specialty: Cardiology Why: _0 :30pm for cath follow up Contact information: [place] [address] [phone]                Discharge Instructions     Diet - low sodium heart healthy   Complete by: As directed    Increase activity slowly   Complete by: As directed         Discharge Medications   Allergies as of 04/26/2022       Reactions   Codeine Nausea Only   Hallucinations, too   Other Nausea And Vomiting   "-mycin" antibiotics.   Also cause hallucinations.   Erythromycin Nausea And Vomiting   Iodinated Contrast Media Other (See Comments)   Renal issues   Lisinopril    Pt doesn't remember    Mycophenolate Mofetil Nausea Only   Sulfa Antibiotics Other (See Comments)   unknown        Medication List     TAKE these medications    acetaminophen 325 MG  tablet Commonly known as: TYLENOL Take 650 mg by mouth every 6 (six) hours as needed for mild pain.   albuterol 108 (90 Base) MCG/ACT inhaler Commonly known as: VENTOLIN HFA INHALE 2 PUFFS INTO THE LUNGS EVERY 6 HOURS AS NEEDED FOR WHEEZING OR SHORTNESS OF BREATH What changed:  how much to take how to take this when to take this reasons to take this additional instructions   ALPRAZolam 0.25 MG tablet Commonly known as: XANAX Take 0.5 tablets (0.125 mg total) by mouth at bedtime as needed. What changed: reasons to take this   chlorhexidine 0.12 % solution Commonly known as: PERIDEX 15 mLs by Mouth Rinse route 2 (two) times daily as needed (sore mouth).   diltiazem 120 MG 24 hr capsule Commonly known as: CARDIZEM CD Take 120 mg by mouth in the morning.   fluticasone 50 MCG/ACT nasal spray Commonly known as: FLONASE INSTILL 1 SPRAY INTO EACH NOSTRIL TWICE A DAY IF NEEDED What changed: See the new instructions.   furosemide 20 MG tablet Commonly known as: LASIX Take 1 tablet by mouth once daily   guaiFENesin 600 MG 12 hr tablet Commonly known as: MUCINEX Take 150 mg by mouth daily.   magic mouthwash Soln Take 10 mLs by mouth 4 (four) times daily as needed for mouth pain.   montelukast 10 MG tablet Commonly known as: SINGULAIR TAKE 1 TABLET BY MOUTH AT BEDTIME   MULTIVITAMIN/IRON PO Take 1 tablet by mouth daily.   Opsumit 10 MG tablet Generic drug:  macitentan Take 10 mg by mouth daily.   OXYGEN Inhale into the lungs. 2 LMP at night and upon exertion   pantoprazole 40 MG tablet Commonly known as: Protonix Take 1 tablet (40 mg total) by mouth 2 (two) times daily before a meal.   spironolactone 25 MG tablet Commonly known as: ALDACTONE Take 12.5 mg by mouth daily.   sucralfate 1 GM/10ML suspension Commonly known as: CARAFATE Take 1 g by mouth every 6 (six) hours as needed (reflux).   triamcinolone 0.1 % paste Commonly known as: KENALOG Use as directed 1 Application in the mouth or throat 3 (three) times daily as needed (sore mouth).   VITAMIN B-12 PO Take 1 tablet by mouth 2 (two) times a week.   VITAMIN B6 PO Take 1 tablet by mouth as needed. Unsure of dose   ZINC PO Take 1 tablet by mouth daily.           Outstanding Labs/Studies   None  Duration of Discharge Encounter   Greater than 30 minutes including physician time.  Signed, Candee Furbish, MD 04/26/2022, 8:35 AM

## 2022-04-26 NOTE — Progress Notes (Signed)
RN went over discharge instructions with the patient and patient's daughter at the bedside. RN reviewed follow up appointment with cardiology and RN educated the patient on post cath care for the next 24-48 hours. Patient verbalize understanding. NT removed PIV and site is clean dry and intact. Tele monitor removed and CCMD notified of d/c. Transportation is in the room.

## 2022-04-27 LAB — LIPOPROTEIN A (LPA): Lipoprotein (a): 34.6 nmol/L — ABNORMAL HIGH (ref <75.0)

## 2022-04-28 ENCOUNTER — Encounter (HOSPITAL_COMMUNITY): Payer: Self-pay | Admitting: Cardiovascular Disease

## 2022-04-29 ENCOUNTER — Other Ambulatory Visit: Payer: Medicare PPO

## 2022-05-02 ENCOUNTER — Telehealth: Payer: Self-pay | Admitting: Internal Medicine

## 2022-05-02 NOTE — Telephone Encounter (Signed)
Lattie Haw from [facility] called to inform us that they will no longer be servicing Paula Ward at her request. Pt stated she does not feel like she needs their services. She only saw them once for the admission visit on 04/13/22 and they weren't able to contact her since then until recently when she called to cancel services.    Lattie Haw can be reached at [phone] if any further information is needed.    Fyi

## 2022-05-05 ENCOUNTER — Ambulatory Visit (INDEPENDENT_AMBULATORY_CARE_PROVIDER_SITE_OTHER): Payer: Medicare PPO

## 2022-05-05 VITALS — BP 106/61 | HR 73 | Temp 98.2°F | Resp 18 | Ht 63.0 in | Wt 107.6 lb

## 2022-05-05 DIAGNOSIS — N183 Chronic kidney disease, stage 3 unspecified: Secondary | ICD-10-CM | POA: Diagnosis not present

## 2022-05-05 DIAGNOSIS — D500 Iron deficiency anemia secondary to blood loss (chronic): Secondary | ICD-10-CM

## 2022-05-05 DIAGNOSIS — D631 Anemia in chronic kidney disease: Secondary | ICD-10-CM | POA: Diagnosis not present

## 2022-05-05 MED ORDER — SODIUM CHLORIDE 0.9 % IV SOLN
510.0000 mg | Freq: Once | INTRAVENOUS | Status: AC
  Administered 2022-05-05: 510 mg via INTRAVENOUS
  Filled 2022-05-05: qty 17

## 2022-05-05 NOTE — Progress Notes (Signed)
Diagnosis: Iron Deficiency Anemia  Provider:  Marshell Garfinkel, MD  Procedure: Infusion  IV Type: Peripheral, IV Location: L Forearm  Feraheme (Ferumoxytol), Dose: 510 mg  Infusion Start Time: 2174  Infusion Stop Time: 1430  Post Infusion IV Care: Peripheral IV Discontinued  Discharge: Condition: Good, Destination: Home . AVS provided to patient.   Performed by:  Arnoldo Morale, RN

## 2022-05-08 ENCOUNTER — Other Ambulatory Visit: Payer: Medicare PPO

## 2022-05-09 ENCOUNTER — Ambulatory Visit (HOSPITAL_COMMUNITY)
Admission: RE | Admit: 2022-05-09 | Discharge: 2022-05-09 | Disposition: A | Discharge status: Home / Self Care | Payer: Medicare PPO | Source: Ambulatory Visit | Attending: Nephrology | Admitting: Nephrology

## 2022-05-09 ENCOUNTER — Other Ambulatory Visit: Payer: Self-pay | Admitting: Internal Medicine

## 2022-05-09 ENCOUNTER — Other Ambulatory Visit (INDEPENDENT_AMBULATORY_CARE_PROVIDER_SITE_OTHER): Payer: Medicare PPO

## 2022-05-09 VITALS — BP 128/38 | HR 83 | Resp 19

## 2022-05-09 DIAGNOSIS — N1832 Chronic kidney disease, stage 3b: Secondary | ICD-10-CM | POA: Insufficient documentation

## 2022-05-09 DIAGNOSIS — I272 Pulmonary hypertension, unspecified: Secondary | ICD-10-CM

## 2022-05-09 DIAGNOSIS — D638 Anemia in other chronic diseases classified elsewhere: Secondary | ICD-10-CM | POA: Diagnosis not present

## 2022-05-09 LAB — CBC
HCT: 31.3 % — ABNORMAL LOW (ref 36.0–46.0)
Hemoglobin: 10.5 g/dL — ABNORMAL LOW (ref 12.0–15.0)
MCHC: 33.5 g/dL (ref 30.0–36.0)
MCV: 107.2 fl — ABNORMAL HIGH (ref 78.0–100.0)
Platelets: 196 10*3/uL (ref 150.0–400.0)
RBC: 2.92 Mil/uL — ABNORMAL LOW (ref 3.87–5.11)
RDW: 14 % (ref 11.5–15.5)
WBC: 5.3 10*3/uL (ref 4.0–10.5)

## 2022-05-09 LAB — COMPREHENSIVE METABOLIC PANEL
ALT: 11 U/L (ref 0–35)
AST: 22 U/L (ref 0–37)
Albumin: 4.1 g/dL (ref 3.5–5.2)
Alkaline Phosphatase: 52 U/L (ref 39–117)
BUN: 30 mg/dL — ABNORMAL HIGH (ref 6–23)
CO2: 30 mEq/L (ref 19–32)
Calcium: 9.2 mg/dL (ref 8.4–10.5)
Chloride: 100 mEq/L (ref 96–112)
Creatinine, Ser: 1.22 mg/dL — ABNORMAL HIGH (ref 0.40–1.20)
GFR: 40.98 mL/min — ABNORMAL LOW (ref >60.00)
Glucose, Bld: 74 mg/dL (ref 70–99)
Potassium: 4 mEq/L (ref 3.5–5.1)
Sodium: 138 mEq/L (ref 135–145)
Total Bilirubin: 0.5 mg/dL (ref 0.2–1.2)
Total Protein: 6.5 g/dL (ref 6.0–8.3)

## 2022-05-09 LAB — FERRITIN: Ferritin: 888 ng/mL — ABNORMAL HIGH (ref 11–307)

## 2022-05-09 LAB — POCT HEMOGLOBIN-HEMACUE: Hemoglobin: 10.9 g/dL — ABNORMAL LOW (ref 12.0–15.0)

## 2022-05-09 LAB — IRON AND TIBC
Iron: 218 ug/dL — ABNORMAL HIGH (ref 28–170)
Saturation Ratios: 112 % — ABNORMAL HIGH (ref 10.4–31.8)
TIBC: 195 ug/dL — ABNORMAL LOW (ref 250–450)

## 2022-05-09 MED ORDER — EPOETIN ALFA-EPBX 10000 UNIT/ML IJ SOLN: 10000.0000 [IU] | INTRAMUSCULAR | Status: DC

## 2022-05-09 MED ORDER — EPOETIN ALFA 10000 UNIT/ML IJ SOLN
INTRAMUSCULAR | Status: AC
  Administered 2022-05-09: 10000 [IU] via SUBCUTANEOUS
  Filled 2022-05-09: qty 1

## 2022-05-09 MED ORDER — EPOETIN ALFA 10000 UNIT/ML IJ SOLN: 10000.0000 [IU] | Freq: Once | INTRAMUSCULAR | Status: DC

## 2022-05-12 ENCOUNTER — Ambulatory Visit (INDEPENDENT_AMBULATORY_CARE_PROVIDER_SITE_OTHER): Payer: Medicare PPO | Admitting: Pulmonary Disease

## 2022-05-12 ENCOUNTER — Encounter: Payer: Self-pay | Admitting: Pulmonary Disease

## 2022-05-12 ENCOUNTER — Ambulatory Visit (INDEPENDENT_AMBULATORY_CARE_PROVIDER_SITE_OTHER): Payer: Medicare PPO

## 2022-05-12 VITALS — BP 109/66 | HR 83 | Temp 99.1°F | Resp 20 | Ht 63.0 in | Wt 107.2 lb

## 2022-05-12 VITALS — BP 126/50 | HR 80 | Ht 63.0 in | Wt 108.0 lb

## 2022-05-12 DIAGNOSIS — R059 Cough, unspecified: Secondary | ICD-10-CM

## 2022-05-12 DIAGNOSIS — J849 Interstitial pulmonary disease, unspecified: Secondary | ICD-10-CM | POA: Diagnosis not present

## 2022-05-12 DIAGNOSIS — N183 Chronic kidney disease, stage 3 unspecified: Secondary | ICD-10-CM

## 2022-05-12 DIAGNOSIS — M349 Systemic sclerosis, unspecified: Secondary | ICD-10-CM | POA: Diagnosis not present

## 2022-05-12 DIAGNOSIS — D500 Iron deficiency anemia secondary to blood loss (chronic): Secondary | ICD-10-CM | POA: Diagnosis not present

## 2022-05-12 DIAGNOSIS — I272 Pulmonary hypertension, unspecified: Secondary | ICD-10-CM

## 2022-05-12 MED ORDER — SODIUM CHLORIDE 0.9 % IV SOLN
510.0000 mg | Freq: Once | INTRAVENOUS | Status: AC
  Administered 2022-05-12: 510 mg via INTRAVENOUS
  Filled 2022-05-12: qty 17

## 2022-05-12 MED ORDER — IPRATROPIUM BROMIDE 0.03 % NA SOLN: 2.0000 | Freq: Two times a day (BID) | NASAL | 12 refills | Status: DC

## 2022-05-12 NOTE — Progress Notes (Signed)
Synopsis: Referred in November 2022 for ILD by Harl Bowie, MD  Subjective:   PATIENT ID: Paula Ward GENDER: female DOB: [birth year], MRN: [id number]  HPI  Chief Complaint  Patient presents with   Follow-up    Follow-up CT on 2 ML of oxygen at night.   Paula Ward is an 86 year old woman, never smoker with history of pulmonary hypertension, AVMs of small bowel, chronic blood loss anemia, CKD, scleroderma who returns to pulmonary clinic for ILD.   CT Chest scan 04/23/22 showed resolution of opacities from her 02/2022 scan with stable subpleural reticulation. She has been doing ok since last visit. She does complain of on going sinus drainage.   OV 02/03/22 She has been doing well since last visit. She reports feeling better after an increase in her hemoglobin from 9 to 12g/dL with epo injections. She is also receiving iron infusions.   She has followed up at [facility] in June.   PFTs today show moderate restriction and moderate diffusion defect.   OV 08/28/21 She has been followed by Dr. Lavonda Jumbo for ILD and Dr. Christa See for pulmonary hypertension related to her scleroderma at the [facility] pulmonary clinic.  She has been referred to our pulmonary clinic to establish care as it is getting more difficult for her to travel to [place].  At this time she would like to continue care at [facility] but also be established with our clinic in case of an emergency.  She reports she has progressive shortness of breath especially with exertion.  She was previously on immunosuppression for treatment of her interstitial lung disease related to scleroderma but she did not tolerate this treatment which was discontinued due to side effects.  Antifibrotic therapy was discussed on her previous visits but has not been initiated due to the side effect profile.  She remains on macitentan for her pulmonary hypertension.  She was previously on supplemental oxygen therapy but based on her 6-minute walk tests at [facility] she  was able to be weaned off oxygen at rest and with exertion.  She denies any chest pain, syncopal episodes or lower extremity edema.  She is a never smoker.  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  She lives at home with her husband.  Past Medical History:  Diagnosis Date   Anemia    Angiodysplasia of stomach and duodenum    Arthus phenomenon    AVM (arteriovenous malformation)    Blood transfusion without reported diagnosis    last 4 units 12-22-15, Iron infusion x2 last -01-07-16,01-14-16.   Breast cancer ([place]) 1989   Left   Candida esophagitis (HCC)    Cataract    Chronic kidney disease    Chronic mild renal insuffiency   CREST syndrome (HCC)    GERD (gastroesophageal reflux disease)    w/ HH   Interstitial lung disease (HCC)    Nodule of kidney    Pericardial effusion    PONV (postoperative nausea and vomiting)    Pulmonary embolus ([place]) 2003   Pulmonary hypertension ([place])    followed by Dr Gaynell Face at [facility], now Dr. Maryjean Ka visit end 2'17.   Rectal incontinence    Renal cell carcinoma (HCC)    Scleroderma (HCC)    Tubular adenoma of colon    Uterine polyp      Family History  Problem Relation Age of Onset   Bladder Cancer Father  Diabetes Father    Prostate cancer Father    Alzheimer's disease Mother    Diabetes Sister    Lung cancer Sister         smoker   Esophageal cancer Paternal Uncle    Colon cancer Neg Hx      Social History   Socioeconomic History   Marital status: Married    Spouse name: Not on file   Number of children: 2   Years of education: Not on file   Highest education level: Not on file  Occupational History   Occupation: Retired  Tobacco Use   Smoking status: Never   Smokeless tobacco: Never  Vaping Use   Vaping Use: Never used  Substance and Sexual Activity   Alcohol use: No   Drug use: No   Sexual activity: Not Currently  Other  Topics Concern   Not on file  Social History Narrative   Married '611 son- '65, 1 daughter '63; 6 children (2 adopted)SO- SOBRetirement- doing well. Marriage in good health. Patient has never smoked. Alcohol use- noPt gets regular exercise   Social Determinants of Health   Financial Resource Strain: Low Risk  (04/02/2022)   Overall Financial Resource Strain (CARDIA)    Difficulty of Paying Living Expenses: Not hard at all  Food Insecurity: No Food Insecurity (04/02/2022)   Hunger Vital Sign    Worried About Running Out of Food in the Last Year: Never true    Ran Out of Food in the Last Year: Never true  Transportation Needs: No Transportation Needs (04/02/2022)   PRAPARE - Hydrologist (Medical): No    Lack of Transportation (Non-Medical): No  Physical Activity: Inactive (04/02/2022)   Exercise Vital Sign    Days of Exercise per Week: 0 days    Minutes of Exercise per Session: 0 min  Stress: No Stress Concern Present (04/02/2022)   [place]    Feeling of Stress : Not at all  Social Connections: [place] (04/02/2022)   Social Connection and Isolation Panel [NHANES]    Frequency of Communication with Friends and Family: More than three times a week    Frequency of Social Gatherings with Friends and Family: More than three times a week    Attends Religious Services: More than 4 times per year    Active Member of [employer] or Organizations: Yes    Attends Music therapist: More than 4 times per year    Marital Status: Married  Human resources officer Violence: Not At Risk (04/02/2022)   Humiliation, Afraid, Rape, and Kick questionnaire    Fear of Current or Ex-Partner: No    Emotionally Abused: No    Physically Abused: No    Sexually Abused: No     Allergies  Allergen Reactions   Codeine Nausea Only    Hallucinations, too   Other Nausea And Vomiting    "-mycin" antibiotics.    Also cause hallucinations.   Erythromycin Nausea And Vomiting   Iodinated Contrast Media Other (See Comments)    Renal issues   Lisinopril     Pt doesn't remember    Mycophenolate Mofetil Nausea Only   Sulfa Antibiotics Other (See Comments)    unknown     Outpatient Medications Prior to Visit  Medication Sig Dispense Refill   albuterol (VENTOLIN HFA) 108 (90 Base) MCG/ACT inhaler INHALE 2 PUFFS INTO THE LUNGS EVERY 6 HOURS AS NEEDED FOR WHEEZING OR SHORTNESS  OF BREATH (Patient taking differently: Inhale 2 puffs into the lungs every 6 (six) hours as needed for wheezing or shortness of breath.) 6.7 g 3   magic mouthwash SOLN Take 10 mLs by mouth 4 (four) times daily as needed for mouth pain. 15 mL 2   montelukast (SINGULAIR) 10 MG tablet TAKE 1 TABLET BY MOUTH AT BEDTIME 90 tablet 0   OXYGEN Inhale into the lungs. 2 LMP at night and upon exertion     fluticasone (FLONASE) 50 MCG/ACT nasal spray INSTILL 1 SPRAY INTO EACH NOSTRIL TWICE A DAY IF NEEDED (Patient taking differently: Place 1 spray into both nostrils daily as needed for allergies.) 48 g 3   acetaminophen (TYLENOL) 325 MG tablet Take 650 mg by mouth every 6 (six) hours as needed for mild pain.      ALPRAZolam (XANAX) 0.25 MG tablet Take 0.5 tablets (0.125 mg total) by mouth at bedtime as needed. (Patient taking differently: Take 0.125 mg by mouth at bedtime as needed for anxiety or sleep.) 30 tablet 3   chlorhexidine (PERIDEX) 0.12 % solution 15 mLs by Mouth Rinse route 2 (two) times daily as needed (sore mouth). 120 mL 11   Cyanocobalamin (VITAMIN B-12 PO) Take 1 tablet by mouth 2 (two) times a week.     diltiazem (CARDIZEM CD) 120 MG 24 hr capsule Take 120 mg by mouth in the morning.     furosemide (LASIX) 20 MG tablet Take 1 tablet by mouth once daily 90 tablet 0   guaiFENesin (MUCINEX) 600 MG 12 hr tablet Take 150 mg by mouth daily.     Multiple Vitamins-Iron (MULTIVITAMIN/IRON PO) Take 1 tablet by mouth daily.     Multiple  Vitamins-Minerals (ZINC PO) Take 1 tablet by mouth daily.     OPSUMIT 10 MG TABS Take 10 mg by mouth daily.     pantoprazole (PROTONIX) 40 MG tablet Take 1 tablet (40 mg total) by mouth 2 (two) times daily before a meal. 60 tablet 5   Pyridoxine HCl (VITAMIN B6 PO) Take 1 tablet by mouth as needed. Unsure of dose     spironolactone (ALDACTONE) 25 MG tablet Take 12.5 mg by mouth daily.     sucralfate (CARAFATE) 1 GM/10ML suspension Take 1 g by mouth every 6 (six) hours as needed (reflux).     triamcinolone (KENALOG) 0.1 % paste Use as directed 1 Application in the mouth or throat 3 (three) times daily as needed (sore mouth).     No facility-administered medications prior to visit.    Review of Systems  Constitutional:  Negative for chills, fever, malaise/fatigue and weight loss.  HENT:  Negative for congestion, sinus pain and sore throat.   Eyes: Negative.   Respiratory:  Positive for shortness of breath. Negative for cough, hemoptysis, sputum production and wheezing.   Cardiovascular:  Negative for chest pain, palpitations, orthopnea, claudication and leg swelling.  Gastrointestinal:  Negative for abdominal pain, heartburn, nausea and vomiting.  Genitourinary: Negative.   Musculoskeletal:  Negative for joint pain and myalgias.  Skin:  Negative for rash.  Neurological:  Negative for weakness.  Endo/Heme/Allergies: Negative.   Psychiatric/Behavioral: Negative.      Objective:   Vitals:   05/12/22 1649  BP: (!) 126/50  Pulse: 80  SpO2: 93%  Weight: 108 lb (49 kg)  Height: _0  (1.6 m)     Physical Exam Constitutional:      General: She is not in acute distress.    Appearance: She is not  ill-appearing.  HENT:     Head: Normocephalic and atraumatic.  Eyes:     General: No scleral icterus.    Conjunctiva/sclera: Conjunctivae normal.  Cardiovascular:     Rate and Rhythm: Normal rate and regular rhythm.     Pulses: Normal pulses.     Heart sounds: Murmur (systolic) heard.   Pulmonary:     Effort: Pulmonary effort is normal.     Breath sounds: Rales (bibasilar) present. No wheezing or rhonchi.  Musculoskeletal:     Right lower leg: No edema.     Left lower leg: No edema.  Lymphadenopathy:     Cervical: No cervical adenopathy.  Skin:    General: Skin is warm and dry.  Neurological:     General: No focal deficit present.     Mental Status: She is alert.  Psychiatric:        Mood and Affect: Mood normal.        Behavior: Behavior normal.        Thought Content: Thought content normal.        Judgment: Judgment normal.     CBC    Component Value Date/Time   WBC 5.3 05/09/2022 1106   RBC 2.92 (L) 05/09/2022 1106   HGB 10.5 (L) 05/09/2022 1106   HGB 11.2 04/21/2022 0940   HCT 31.3 (L) 05/09/2022 1106   HCT 32.7 (L) 04/21/2022 0940   PLT 196.0 05/09/2022 1106   PLT 242 04/21/2022 0940   MCV 107.2 (H) 05/09/2022 1106   MCV 104 (H) 04/21/2022 0940   MCH 34.9 (H) 04/26/2022 0159   MCHC 33.5 05/09/2022 1106   RDW 14.0 05/09/2022 1106   RDW 13.1 04/21/2022 0940   LYMPHSABS 0.6 (L) 03/17/2022 1621   MONOABS 0.6 03/17/2022 1621   EOSABS 0.0 03/17/2022 1621   BASOSABS 0.0 03/17/2022 1621   Chest imaging: CT Chest 04/23/22 1. Marked interval improvement in the patchy and confluent areas of ground-glass opacity seen previously with resolution in some regions. 2. Dependent consolidative airspace disease seen previously in the lower lobes has largely resolved in the interval. 3. Subpleural reticulation bilaterally is similar, compatible with underlying chronic interstitial lung disease. 4. Similar moderate to large pericardial effusion. 5. Enlargement of the pulmonary outflow tract/main pulmonary arteries suggests pulmonary arterial hypertension. 6. 7 mm right upper lobe pulmonary nodule is stable back to 03/30/2021. 7. Similar left-sided pleural effusion. 8. Aortic Atherosclerosis   CTA Chest 03/17/22 1. No evidence for pulmonary embolism. 2.  Large pericardial effusion. 3. Mild cardiomegaly. 4. Small bilateral pleural effusions. 5. Bilateral lower lobe airspace disease compatible with infection. 6. Ground-glass opacities in both lungs favored as edema, although infection is not excluded. Some ground-glass areas are nodular. 7. A follow-up chest CT is recommended in 3 months to re-evaluate and confirm resolution.  CT Chest 03/30/21 Cardiovascular: Large pericardial effusion. Cardiomegaly. Atherosclerotic calcifications of the aorta. Aortic valve calcifications.   Mediastinum/Nodes: Thyroid is unremarkable. No axillary adenopathy. Status post LEFT axillary node sampling. No definitive mediastinal adenopathy.   Lungs/Pleura: Small to moderate LEFT pleural effusion. Trace RIGHT pleural effusion. Subpleural reticulation bilaterally. Anteriorly located subpleural reticulation of the LEFT breast likely reflecting sequela of radiation related changes. Irregular RIGHT apical pulmonary nodule versus scar measures 6 x 3 mm (series 7, image 27). Several adjacent RIGHT middle lobe pulmonary nodules each measure approximately 4 mm (series 7, image 104). LEFT lower lobe consolidative opacity, nonspecific. RIGHT basilar linear opacity most consistent with atelectasis.  HRCT Chest 04/12/2019 1. Findings  suggestive of a probable UIP pattern fibrosis, which appears  similar to slightly progressed since 2012 and is likely related to the  patient's known underlying connective tissue disorder.  2. Small pulmonary nodules measuring up to 6 mm. Recommend 12 month follow  up chest CT to assess stability, per 2017 Fleischner criteria.  3, Large pericardial effusion. This finding is known per clinic note dated  12/23/2018 and has been documented on a recent echo.   PFT:    Latest Ref Rng & Units 02/03/2022   11:01 AM  PFT Results  FVC-Pre L 1.34   FVC-Predicted Pre % 56   Pre FEV1/FVC % % 84   FEV1-Pre L 1.12   FEV1-Predicted Pre % 64    DLCO uncorrected ml/min/mmHg 10.32   DLCO UNC% % 57   DLCO corrected ml/min/mmHg 11.49   DLCO COR %Predicted % 63   DLVA Predicted % 119   TLC L 2.88   TLC % Predicted % 58   RV % Predicted % 62     PFT 02/28/21 FEV1/FVC 72%, FEV 1.03 (57%), FVC 1.42L (59%), DLCO 41%  PFT 03/01/2020 FEV1/FVC 73%, FEV1 1.25L (65%), FVC 1.25L (68%), DLCO 37.3%  6MWT 08/2020 Six minute walk distance is within the normal predicted range.  Oxygenation during exercise was adequate (SpO2 =>90%) without the use of supplemental O2. At peak exercise, the heart rate indicated cardiovascular maximums were being approached.  Perceived dyspnea at end of exercise was absent or mild.  Labs:  Path:  Echo 02/28/21: NORMAL LEFT VENTRICULAR SYSTOLIC FUNCTION WITH MILD LVH    NORMAL RIGHT VENTRICULAR SYSTOLIC FUNCTION    VALVULAR REGURGITATION: TRIVIAL MR, MILD TR    VALVULAR STENOSIS: MODERATE AS, MILD MS    LARGE PERICARDIAL EFFUSION (See above)   Heart Catheterization: Cardiac catheterization was 02/07/08. It looked quite good with a PAP of 35/11 (mean 22), mean RAP of 3, PVR = 1.7 wood units, cardiac index = 3.69 L/min/m2, PCWP of 11. Cardiac catheterization in September 2004 prior to therapy revealed an RA pressure was 5, PA pressure 60/15 with a mean of 35, cardiac index 3.2, and PVR 5. The next cath on 10/03/2003, which was performed on iloprost / placebo, revealed a RA pressure of 2, PA pressure of 55/17 with a mean of 32, PCWP of 5, cardiac index of 3.1-3.7 and PVR between 3 and 4.    Assessment & Plan:   ILD (interstitial lung disease) ([place])  Scleroderma ([place])  Pulmonary hypertension (HCC)  Cough, unspecified type - Plan: ipratropium (ATROVENT) 0.03 % nasal spray  Discussion: Paula Ward is an 86 year old woman, never smoker with history of pulmonary hypertension, AVMs of small bowel, chronic blood loss anemia, CKD, scleroderma who returns to pulmonary clinic for ILD.   She will remain on  macitentan for her pulmonary hypertension.    She does appear to have progressive fibrotic changes based on her CT chest imaging and she also has a progressive decline in her pulmonary function tests, but after todays PFT testing, they appear stable at this time.  Antifibrotic therapy has been discussed previously at her visits at [facility] and not initiated due to the side effect profile.  She is not currently requiring supplemental oxygen at this time.  We will continue to monitor alongside her providers at [facility] at this time.  No changes to her current management.  She had RHC earlier this year with no proposed changes to her PH regimen.   She is to add ipratropium  nasal spray daily for his post nasal drainage and cough.  Follow-up in 6 months.  Freda Jackson, MD [facility] Office: [phone]    Current Outpatient Medications:    albuterol (VENTOLIN HFA) 108 (90 Base) MCG/ACT inhaler, INHALE 2 PUFFS INTO THE LUNGS EVERY 6 HOURS AS NEEDED FOR WHEEZING OR SHORTNESS OF BREATH (Patient taking differently: Inhale 2 puffs into the lungs every 6 (six) hours as needed for wheezing or shortness of breath.), Disp: 6.7 g, Rfl: 3   ipratropium (ATROVENT) 0.03 % nasal spray, Place 2 sprays into both nostrils every 12 (twelve) hours., Disp: 30 mL, Rfl: 12   magic mouthwash SOLN, Take 10 mLs by mouth 4 (four) times daily as needed for mouth pain., Disp: 15 mL, Rfl: 2   montelukast (SINGULAIR) 10 MG tablet, TAKE 1 TABLET BY MOUTH AT BEDTIME, Disp: 90 tablet, Rfl: 0   OXYGEN, Inhale into the lungs. 2 LMP at night and upon exertion, Disp: , Rfl:    acetaminophen (TYLENOL) 325 MG tablet, Take 650 mg by mouth every 6 (six) hours as needed for mild pain. , Disp: , Rfl:    ALPRAZolam (XANAX) 0.25 MG tablet, Take 0.5 tablets (0.125 mg total) by mouth at bedtime as needed. (Patient taking differently: Take 0.125 mg by mouth at bedtime as needed for anxiety or sleep.), Disp: 30 tablet, Rfl: 3    chlorhexidine (PERIDEX) 0.12 % solution, 15 mLs by Mouth Rinse route 2 (two) times daily as needed (sore mouth)., Disp: 120 mL, Rfl: 11   Cyanocobalamin (VITAMIN B-12 PO), Take 1 tablet by mouth 2 (two) times a week., Disp: , Rfl:    diltiazem (CARDIZEM CD) 120 MG 24 hr capsule, Take 120 mg by mouth in the morning., Disp: , Rfl:    fluticasone (FLONASE) 50 MCG/ACT nasal spray, Place 1 spray into both nostrils daily as needed for allergies., Disp: 48 g, Rfl: 0   furosemide (LASIX) 20 MG tablet, Take 1 tablet by mouth once daily, Disp: 90 tablet, Rfl: 0   guaiFENesin (MUCINEX) 600 MG 12 hr tablet, Take 150 mg by mouth daily., Disp: , Rfl:    Multiple Vitamins-Iron (MULTIVITAMIN/IRON PO), Take 1 tablet by mouth daily., Disp: , Rfl:    Multiple Vitamins-Minerals (ZINC PO), Take 1 tablet by mouth daily., Disp: , Rfl:    OPSUMIT 10 MG TABS, Take 10 mg by mouth daily., Disp: , Rfl:    pantoprazole (PROTONIX) 40 MG tablet, Take 1 tablet (40 mg total) by mouth 2 (two) times daily before a meal., Disp: 60 tablet, Rfl: 5   Pyridoxine HCl (VITAMIN B6 PO), Take 1 tablet by mouth as needed. Unsure of dose, Disp: , Rfl:    spironolactone (ALDACTONE) 25 MG tablet, Take 12.5 mg by mouth daily., Disp: , Rfl:    sucralfate (CARAFATE) 1 GM/10ML suspension, Take 1 g by mouth every 6 (six) hours as needed (reflux)., Disp: , Rfl:    triamcinolone (KENALOG) 0.1 % paste, Use as directed 1 Application in the mouth or throat 3 (three) times daily as needed (sore mouth)., Disp: , Rfl:

## 2022-05-12 NOTE — Patient Instructions (Addendum)
Use flonase nasal spray daily, 1 spray per nostril daily  Start ipratropium nasal spray, 2 sprays per nostril twice daily  Continue sleeping on wedge pillow and taking pantoprazole twice daily  Continue taking opsumit 77m daily for pulmonary hypertension  Follow up in 6 months

## 2022-05-12 NOTE — Progress Notes (Signed)
Diagnosis: Iron Deficiency Anemia  Provider:  Marshell Garfinkel, MD  Procedure: Infusion  IV Type: Peripheral, IV Location: R Hand  Feraheme (Ferumoxytol), Dose: 52m  Infusion Start Time: 18421 Infusion Stop Time: 10312 Post Infusion IV Care: Peripheral IV Discontinued  Discharge: Condition: Good, Destination: Home . AVS provided to patient.   Performed by:  SArnoldo Morale RN

## 2022-05-15 ENCOUNTER — Encounter: Payer: Self-pay | Admitting: Internal Medicine

## 2022-05-15 ENCOUNTER — Telehealth: Payer: Self-pay | Admitting: Internal Medicine

## 2022-05-15 MED ORDER — FLUTICASONE PROPIONATE 50 MCG/ACT NA SUSP: 1.0000 | Freq: Every day | NASAL | 0 refills | Status: DC | PRN

## 2022-05-15 NOTE — Telephone Encounter (Signed)
Refill has been sent to the pt's pharmacy

## 2022-05-15 NOTE — Telephone Encounter (Signed)
Caller & Relationship to patient: Paula Ward  Call back number: [phone]  Date of last office visit: 03/31/22  Date of next office visit:   Medication(s) to be refilled:  fluticasone Asencion Islam) 50 MCG/ACT nasal spray   Preferred Pharmacy:  [facility], [place] Phone:  [phone]  Fax:  [phone]

## 2022-05-18 ENCOUNTER — Encounter: Payer: Self-pay | Admitting: Pulmonary Disease

## 2022-05-21 ENCOUNTER — Ambulatory Visit: Payer: Medicare PPO | Admitting: Physician Assistant

## 2022-05-21 ENCOUNTER — Encounter: Payer: Self-pay | Admitting: Physician Assistant

## 2022-05-21 VITALS — BP 130/60 | HR 80 | Ht 63.0 in | Wt 105.0 lb

## 2022-05-21 DIAGNOSIS — I5032 Chronic diastolic (congestive) heart failure: Secondary | ICD-10-CM

## 2022-05-21 DIAGNOSIS — I272 Pulmonary hypertension, unspecified: Secondary | ICD-10-CM | POA: Diagnosis not present

## 2022-05-21 DIAGNOSIS — H6121 Impacted cerumen, right ear: Secondary | ICD-10-CM | POA: Diagnosis not present

## 2022-05-21 DIAGNOSIS — I350 Nonrheumatic aortic (valve) stenosis: Secondary | ICD-10-CM

## 2022-05-21 DIAGNOSIS — I3139 Other pericardial effusion (noninflammatory): Secondary | ICD-10-CM

## 2022-05-21 NOTE — Patient Instructions (Signed)
Medication Instructions:  Your physician recommends that you continue on your current medications as directed. Please refer to the Current Medication list given to you today. *If you need a refill on your cardiac medications before your next appointment, please call your pharmacy*   Lab Work: None ordered   Testing/Procedures: None Ordered   Follow-Up: At [facility], you and your health needs are our priority.  As part of our continuing mission to provide you with exceptional heart care, we have created designated Provider Care Teams.  These Care Teams include your primary Cardiologist (physician) and Advanced Practice Providers (APPs -  Physician Assistants and Nurse Practitioners) who all work together to provide you with the care you need, when you need it.  We recommend signing up for the patient portal called "MyChart".  Sign up information is provided on this After Visit Summary.  MyChart is used to connect with patients for Virtual Visits (Telemedicine).  Patients are able to view lab/test results, encounter notes, upcoming appointments, etc.  Non-urgent messages can be sent to your provider as well.   To learn more about what you can do with MyChart, go to [email or web address].    Your next appointment:   AS SCHEDULED  The format for your next appointment:   In Person  Provider:   Kirk Ruths, MD     Other Instructions   Important Information About Sugar

## 2022-05-21 NOTE — Progress Notes (Signed)
Cardiology Office Note:    Date:  05/21/2022   ID:  Paula Ward, DOB 1936-04-17, MRN [id number]  PCP:  Hoyt Koch, MD  [facility] Cardiologist:  Kirk Ruths, MD  [facility] Electrophysiologist:  None   Chief Complaint: hospital follow up   History of Present Illness:    Paula Ward is a 86 y.o. 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   She is followed in the pulmonary hypertension clinic at [facility] as well.  Dr. Stanford Breed is her primary cardiologist.    She recently was hospitalized with chest discomfort and this was ultimately felt to not be related to ischemic heart disease with minimal troponin elevation.  While she was hospitalized, she had a CT scan with contrast and developed contrast nephropathy with her creatinine increasing from 1.2 up to 2.0, now back down. The patient has had a large pericardial effusion that has been followed over time with serial imaging.  The majority of the effusion is over on the posterolateral side of the heart and has not been felt to be easily accessible for needle pericardiocentesis.  Echo during admission 03/18/22 showed LVEF of 55-60% and grade 1 DD. Large pericardial effusion surrounds heart Most prominent along lateral side of LV 24 mm in maximal dimension. Moderate aortic stenosis with mean gradient of 72m Hg. Mitral valve leaflets are thickened with moderately restricted motion with peak and mean gradient 12 and 7 mm Hg respectively.    [facility]  This demonstrated a mean PA pressure of 32 mmHg, pulmonary capillary wedge pressure of 21 mmHg, cardiac output of 6 L/min, and PVR of 1.8 Wood units. Right atrial  pressure was normal at 7 mmHg and not felt to be contributing to her pleural effusion.  Seen by Dr. CBurt Knack6/20/23 for evaluation of nonrheumatic aortic valve stenosis. Cath performed on 04/25/2022 showed no obstructive disease. Mild pulmonary hypertension with PA pressure 52/15, mean 28 mm Hg. Moderate aortic stenosis with mean transvalvular gradient 16 mm Hg. No complication. Per patient's request, She was kept here overnight given prior hx of contrast nephropathy last admission. No overnight complications. Scr improved to 1.02. Hgb stable at 9.6. No hematoma at cath site.   [facility] DE[facility] for ILD and progressive fibrotic changes. No change made to her regimen.   She is here for follow up.  No complaints.  She uses 2 L oxygen at night.  She denies daytime breathing issue.  No chest pain, dizziness, palpitation, orthopnea, PND, syncope or lower extremity edema.  Past Medical History:  Diagnosis Date   Anemia    Angiodysplasia of stomach and duodenum    Arthus phenomenon    AVM (arteriovenous malformation)    Blood transfusion without reported diagnosis    last 4 units 12-22-15, Iron infusion x2 last -01-07-16,01-14-16.   [place]   [place]   Candida esophagitis (HCC)    Cataract    Chronic kidney disease    Chronic mild renal insuffiency   CREST syndrome (HCC)    GERD (gastroesophageal reflux disease)    w/ HH   Interstitial lung disease (HCC)    Nodule of kidney    Pericardial effusion  PONV (postoperative nausea and vomiting)    Pulmonary embolus ([place]) 2003   Pulmonary hypertension ([place])    followed by Dr Gaynell Face at [facility], now Dr. Maryjean Ka visit end 2'17.   Rectal incontinence    Renal cell carcinoma (HCC)    Scleroderma (HCC)    Tubular adenoma of colon    Uterine polyp     Past Surgical History:  Procedure Laterality Date   APPENDECTOMY  1962   BREAST LUMPECTOMY  1989   left   CATARACT EXTRACTION, BILATERAL Bilateral  12/2013   COLONOSCOPY WITH PROPOFOL N/A 04/15/2021   Procedure: COLONOSCOPY WITH PROPOFOL;  Surgeon: Milus Banister, MD;  Location: [facility] END[facility]COPY;  Service: Endoscopy;  Laterality: N/A;   ENTEROSCOPY N/A 01/18/2016   Procedure: ENTEROSCOPY;  Surgeon: Mauri Pole, MD;  Location: [facility];  Service: Endoscopy;  Laterality: N/A;   ENTEROSCOPY N/A 05/24/2018   Procedure: ENTEROSCOPY;  Surgeon: Jackquline Denmark, MD;  Location: [facility];  Service: Endoscopy;  Laterality: N/A;   ENTEROSCOPY N/A 03/29/2021   Procedure: ENTEROSCOPY;  Surgeon: Jerene Bears, MD;  Location: [facility];  Service: Gastroenterology;  Laterality: N/A;   ENTEROSCOPY N/A 04/13/2021   Procedure: ENTEROSCOPY;  Surgeon: Yetta Flock, MD;  Location: [facility];  Service: Gastroenterology;  Laterality: N/A;   ESOPHAGOGASTRODUODENOSCOPY (EGD) WITH PROPOFOL N/A 12/21/2015   Procedure: ESOPHAGOGASTRODUODENOSCOPY (EGD) WITH PROPOFOL;  Surgeon: Irene Shipper, MD;  [facility]: [facility];  Service: Endoscopy;  Laterality: N/A;   HOT HEMOSTASIS N/A 05/24/2018   Procedure: HOT HEMOSTASIS (ARGON PLASMA COAGULATION/BICAP);  Surgeon: Jackquline Denmark, MD;  [facility];  Service: Endoscopy;  Laterality: N/A;   HOT HEMOSTASIS N/A 03/29/2021   Procedure: HOT HEMOSTASIS (ARGON PLASMA COAGULATION/BICAP);  Surgeon: Jerene Bears, MD;  Location[facility];  Service: Gastroenterology;  Laterality: N/A;   HOT HEMOSTASIS N/A 04/13/2021   Procedure: HOT HEMOSTASIS (ARGON PLASMA COAGULATION/BICAP);  Surgeon: Yetta Flock, MD;  Location[facility];  Service: Gastroenterology;  Laterality: N/A;   IR GENERIC HISTORICAL  06/05/2016   IR RADIOLOGIST EVAL & MGMT 06/05/2016 Aletta Edouard, MD [facility] INTERV RAD   IVC Filter     KIDNEY SURGERY     left -"laser surgery by Dr. Kathlene Cote- 5 yrs ago" no removal   RIGHT/LEFT HEART CATH AND CORONARY ANGIOGRAPHY N/A 04/25/2022   Procedure: RIGHT/LEFT HEART CATH AND CORONARY ANGIOGRAPHY;   Surgeon: Sherren Mocha, MD;  Location: [place] CV LAB;  Service: Cardiovascular;  Laterality: N/A;   SCHLEROTHERAPY  05/24/2018   Procedure: Woodward Ku;  Surgeon: Jackquline Denmark, MD;  [facility]: [facility];  Service: Endoscopy;;   SUBMUCOSAL TATTOO INJECTION  04/13/2021   Procedure: SUBMUCOSAL TATTOO INJECTION;  Surgeon: Yetta Flock, MD;  Location: [facility];  Service: Gastroenterology;;   TONSILLECTOMY     TUBAL LIGATION      Current Medications: Current Meds  Medication Sig   acetaminophen (TYLENOL) 325 MG tablet Take 650 mg by mouth every 6 (six) hours as needed for mild pain.    albuterol (VENTOLIN HFA) 108 (90 Base) MCG/ACT inhaler INHALE 2 PUFFS INTO THE LUNGS EVERY 6 HOURS AS NEEDED FOR WHEEZING OR SHORTNESS OF BREATH   ALPRAZolam (XANAX) 0.25 MG tablet Take 0.5 tablets (0.125 mg total) by mouth at bedtime as needed.   chlorhexidine (PERIDEX) 0.12 % solution 15 mLs by Mouth Rinse route 2 (two) times daily as needed (sore mouth).   Cyanocobalamin (VITAMIN B-12 PO) Take 1 tablet by mouth 2 (two) times a week.  diltiazem (CARDIZEM CD) 120 MG 24 hr capsule Take 120 mg by mouth in the morning.   fluticasone (FLONASE) 50 MCG/ACT nasal spray Place 1 spray into both nostrils daily as needed for allergies.   furosemide (LASIX) 20 MG tablet Take 1 tablet by mouth once daily   guaiFENesin (MUCINEX) 600 MG 12 hr tablet Take 150 mg by mouth daily.   ipratropium (ATROVENT) 0.03 % nasal spray Place 2 sprays into both nostrils every 12 (twelve) hours.   magic mouthwash SOLN Take 10 mLs by mouth 4 (four) times daily as needed for mouth pain.   montelukast (SINGULAIR) 10 MG tablet TAKE 1 TABLET BY MOUTH AT BEDTIME   Multiple Vitamins-Iron (MULTIVITAMIN/IRON PO) Take 1 tablet by mouth daily.   Multiple Vitamins-Minerals (ZINC PO) Take 1 tablet by mouth daily.   OPSUMIT 10 MG TABS Take 10 mg by mouth daily.   OXYGEN Inhale into the lungs. 2 LMP at night and upon exertion    pantoprazole (PROTONIX) 40 MG tablet Take 1 tablet (40 mg total) by mouth 2 (two) times daily before a meal.   Pyridoxine HCl (VITAMIN B6 PO) Take 1 tablet by mouth as needed. Unsure of dose   spironolactone (ALDACTONE) 25 MG tablet Take 12.5 mg by mouth daily.   sucralfate (CARAFATE) 1 GM/10ML suspension Take 1 g by mouth every 6 (six) hours as needed (reflux).   triamcinolone (KENALOG) 0.1 % paste Use as directed 1 Application in the mouth or throat 3 (three) times daily as needed (sore mouth).     Allergies:   Codeine, Other, Erythromycin, Iodinated contrast media, Lisinopril, Mycophenolate mofetil, and Sulfa antibiotics   Social History   Socioeconomic History   Marital status: Married    Spouse name: Not on file   Number of children: 2   Years of education: Not on file   Highest education level: Not on file  Occupational History   Occupation: Retired  Tobacco Use   Smoking status: Never   Smokeless tobacco: Never  Vaping Use   Vaping Use: Never used  Substance and Sexual Activity   Alcohol use: No   Drug use: No   Sexual activity: Not Currently  Other Topics Concern   Not on file  Social History Narrative   Married '611 son- '65, 1 daughter '63; 6 children (2 adopted)SO- SOBRetirement- doing well. Marriage in good health. Patient has never smoked. Alcohol use- noPt gets regular exercise   Social Determinants of Health   Financial Resource Strain: Low Risk  (04/02/2022)   Overall Financial Resource Strain (CARDIA)    Difficulty of Paying Living Expenses: Not hard at all  Food Insecurity: No Food Insecurity (04/02/2022)   Hunger Vital Sign    Worried About Running Out of Food in the Last Year: Never true    Ran Out of Food in the Last Year: Never true  Transportation Needs: No Transportation Needs (04/02/2022)   PRAPARE - Hydrologist (Medical): No    Lack of Transportation (Non-Medical): No  Physical Activity: Inactive (04/02/2022)   Exercise  Vital Sign    Days of Exercise per Week: 0 days    Minutes of Exercise per Session: 0 min  Stress: No Stress Concern Present (04/02/2022)   [place]    Feeling of Stress : Not at all  Social Connections: [place] (04/02/2022)   Social Connection and Isolation Panel [NHANES]    Frequency of Communication with Friends  and Family: More than three times a week    Frequency of Social Gatherings with Friends and Family: More than three times a week    Attends Religious Services: More than 4 times per year    Active Member of Clubs or Organizations: Yes    Attends Music therapist: More than 4 times per year    Marital Status: Married     Family History: The patient's family history includes Alzheimer's disease in her mother; Bladder Cancer in her father; Diabetes in her father and sister; Esophageal cancer in her paternal uncle; Lung cancer in her sister; Prostate cancer in her father. There is no history of Colon cancer.    ROS:   Please see the history of present illness.    All other systems reviewed and are negative.   EKGs/Labs/Other Studies Reviewed:    The following studies were reviewed today: RIGHT/LEFT HEART CATH AND CORONARY ANGIOGRAPHY 04/25/22    Conclusion   1.  Ectatic but patent coronary arteries with no obstructive disease.  Right dominant coronary artery. 2.  Mild pulmonary hypertension with PA pressure 52/15, mean 28 mmHg, transpulmonary gradient 10 mmHg, PVR approximately 2 Wood units. 3.  Moderate aortic stenosis with mean transvalvular gradient 16 mmHg calculated aortic valve area 1.16 cm 4.  Heavy mitral annular calcification by plain fluoroscopy 5.  Mild aortic valve calcification by plain fluoroscopy   Recommend: Continued medical therapy, clinical follow-up, echo surveillance.  The patient does not appear to have severe aortic stenosis and I do not think she would benefit  from TAVR at this time.   Diagnostic Dominance: Right     Echo 03/18/22 1. Left ventricular ejection fraction, by estimation, is 55 to 60%. The  left ventricle has normal function. The left ventricle has no regional  wall motion abnormalities. There is moderate left ventricular hypertrophy.  Left ventricular diastolic  parameters are consistent with Grade I diastolic dysfunction (impaired  relaxation). Elevated left atrial pressure.   2. Right ventricular systolic function is normal. The right ventricular  size is normal.   3. Left atrial size was severely dilated.   4. Large pericardial effusion surrounds heart Most prominent along  lateral side of LV 24 mm in maximal dimension. There is no evidence by  echo criteria for hemodynamic compromise. Mitral inflow shows no  signficant respiratory variation, there is no RV  collapse, IVC is normal size. . Large pleural effusion.   5. Signficant mitral annular calcification. Mitral leaflets are thickened  with moderately restricted motion. Peak and mean gradients through the  valve are 12 and 7 mm Hg respectively. (HR =91 bpm). The mitral valve is  abnormal. Trivial mitral valve  regurgitation. No evidence of mitral stenosis. Moderate to severe mitral  annular calcification.   6. AV is thickened, calcified with restricted motion. Peak and mean  gradients through the valve are 45 and 25 mm Hg respectively Dimensionless  index is 0.26. Overall consistent with moderate AS. Compared to echo  report from Jan 2023 [facility]), no  significant change. . The aortic valve is tricuspid. Aortic valve  regurgitation is not visualized.   EKG:  EKG is not  ordered today.   Recent Labs: 03/17/2022: B Natriuretic Peptide 2,025.3 03/18/2022: Magnesium 1.9 05/09/2022: ALT 11; BUN 30; Creatinine, Ser 1.22; Hemoglobin 10.5; Platelets 196.0; Potassium 4.0; Sodium 138  Recent Lipid Panel    Component Value Date/Time   CHOL 158 06/24/2018 1004   TRIG 112.0  06/24/2018 1004   HDL  57.50 06/24/2018 1004   CHOLHDL 3 06/24/2018 1004   VLDL 22.4 06/24/2018 1004   LDLCALC 78 06/24/2018 1004    Physical Exam:    VS:  BP 130/60   Pulse 80   Ht _0  (1.6 m)   Wt 105 lb (47.6 kg)   SpO2 95%   BMI 18.60 kg/m     Wt Readings from Last 3 Encounters:  05/21/22 105 lb (47.6 kg)  05/12/22 108 lb (49 kg)  05/12/22 107 lb 3.2 oz (48.6 kg)     GEN:  Well nourished, well developed in no acute distress HEENT: Normal NECK: No JVD; No carotid bruits LYMPHATICS: No lymphadenopathy CARDIAC: RRR, + murmurs, rubs, gallops RESPIRATORY:  Clear to auscultation without rales, wheezing or rhonchi  ABDOMEN: Soft, non-tender, non-distended MUSCULOSKELETAL:  No edema; No deformity  SKIN: Warm and dry NEUROLOGIC:  Alert and oriented x 3 PSYCHIATRIC:  Normal affect   ASSESSMENT AND PLAN:    Aortic stenosis - Cardiac cath 04/2022  showed no obstructive disease. Mild pulmonary hypertension with PA pressure 52/15, mean 28 mm Hg. Moderate aortic stenosis with mean transvalvular gradient 16 mm Hg.  - Follow with routine echo  - Continue current therapy   2. Pulmonary hypertension 2nd to scleroderma -Uses oxygen at night  3. CKD III - Scr stable    Medication Adjustments/Labs and Tests Ordered: Current medicines are reviewed at length with the patient today.  Concerns regarding medicines are outlined above.  No orders of the defined types were placed in this encounter.  No orders of the defined types were placed in this encounter.   Patient Instructions  Medication Instructions:  Your physician recommends that you continue on your current medications as directed. Please refer to the Current Medication list given to you today. *If you need a refill on your cardiac medications before your next appointment, please call your pharmacy*   Lab Work: None ordered   Testing/Procedures: None Ordered   Follow-Up: At [facility], you and your health  needs are our priority.  As part of our continuing mission to provide you with exceptional heart care, we have created designated Provider Care Teams.  These Care Teams include your primary Cardiologist (physician) and Advanced Practice Providers (APPs -  Physician Assistants and Nurse Practitioners) who all work together to provide you with the care you need, when you need it.  We recommend signing up for the patient portal called "MyChart".  Sign up information is provided on this After Visit Summary.  MyChart is used to connect with patients for Virtual Visits (Telemedicine).  Patients are able to view lab/test results, encounter notes, upcoming appointments, etc.  Non-urgent messages can be sent to your provider as well.   To learn more about what you can do with MyChart, go to [email or web address].    Your next appointment:   AS SCHEDULED  The format for your next appointment:   In Person  Provider:   Kirk Ruths, MD     Other Instructions   Important Information About Sugar         Jarrett Soho, [place]  05/21/2022 2:06 PM    [place]

## 2022-05-22 ENCOUNTER — Encounter: Payer: Self-pay | Admitting: Internal Medicine

## 2022-05-22 ENCOUNTER — Ambulatory Visit (INDEPENDENT_AMBULATORY_CARE_PROVIDER_SITE_OTHER): Payer: Medicare PPO | Admitting: Internal Medicine

## 2022-05-22 DIAGNOSIS — Z024 Encounter for examination for driving license: Secondary | ICD-10-CM

## 2022-05-22 DIAGNOSIS — G3281 Cerebellar 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: Secondary | ICD-10-CM

## 2022-05-22 NOTE — Patient Instructions (Signed)
We will get the form done. Bring [place] the eye doctor page and we will fax it in.
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
## 2022-05-22 NOTE — Assessment & Plan Note (Signed)
She does have normal sensation in her feet and this should not impact driving. She is stable on her feet and cognition intact and able to operate a vehicle. We will be able to fill out her forms except the vision page. We have asked her to get her eye doctor to fill this part out and return to [place] and we will fax in for her.

## 2022-06-06 ENCOUNTER — Ambulatory Visit (HOSPITAL_COMMUNITY)
Admission: RE | Admit: 2022-06-06 | Discharge: 2022-06-06 | Disposition: A | Discharge status: Home / Self Care | Payer: Medicare PPO | Source: Ambulatory Visit | Attending: Nephrology | Admitting: Nephrology

## 2022-06-06 VITALS — BP 96/60 | HR 85 | Temp 98.1°F

## 2022-06-06 DIAGNOSIS — D638 Anemia in other chronic diseases classified elsewhere: Secondary | ICD-10-CM | POA: Insufficient documentation

## 2022-06-06 DIAGNOSIS — N1832 Chronic kidney disease, stage 3b: Secondary | ICD-10-CM | POA: Diagnosis not present

## 2022-06-06 LAB — IRON AND TIBC
Iron: 96 ug/dL (ref 28–170)
Saturation Ratios: 44 % — ABNORMAL HIGH (ref 10.4–31.8)
TIBC: 218 ug/dL — ABNORMAL LOW (ref 250–450)
UIBC: 122 ug/dL

## 2022-06-06 LAB — FERRITIN: Ferritin: 910 ng/mL — ABNORMAL HIGH (ref 11–307)

## 2022-06-06 LAB — POCT HEMOGLOBIN-HEMACUE: Hemoglobin: 11.3 g/dL — ABNORMAL LOW (ref 12.0–15.0)

## 2022-06-06 MED ORDER — EPOETIN ALFA 10000 UNIT/ML IJ SOLN: 10000.0000 [IU] | Freq: Once | INTRAMUSCULAR | Status: DC

## 2022-06-06 MED ORDER — EPOETIN ALFA 10000 UNIT/ML IJ SOLN
INTRAMUSCULAR | Status: AC
  Administered 2022-06-06: 10000 [IU]
  Filled 2022-06-06: qty 1

## 2022-06-06 MED ORDER — EPOETIN ALFA-EPBX 10000 UNIT/ML IJ SOLN: 10000.0000 [IU] | INTRAMUSCULAR | Status: DC

## 2022-06-09 ENCOUNTER — Other Ambulatory Visit (HOSPITAL_COMMUNITY): Payer: Self-pay

## 2022-06-09 ENCOUNTER — Telehealth: Payer: Self-pay | Admitting: Internal Medicine

## 2022-06-09 NOTE — Telephone Encounter (Signed)
Pt states she dropped some paperwork off on Friday(06/06/22) to the gentleman at the front desk. She said she would like a call letting her know when the paperwork is ready to be picked up.   CB: [phone]

## 2022-06-10 ENCOUNTER — Ambulatory Visit: Payer: Medicare PPO | Admitting: Family Medicine

## 2022-06-10 ENCOUNTER — Ambulatory Visit: Payer: Self-pay

## 2022-06-10 ENCOUNTER — Other Ambulatory Visit (INDEPENDENT_AMBULATORY_CARE_PROVIDER_SITE_OTHER): Payer: Medicare PPO

## 2022-06-10 ENCOUNTER — Ambulatory Visit (INDEPENDENT_AMBULATORY_CARE_PROVIDER_SITE_OTHER): Payer: Medicare PPO

## 2022-06-10 ENCOUNTER — Encounter: Payer: Self-pay | Admitting: Internal Medicine

## 2022-06-10 VITALS — BP 122/64 | HR 80 | Ht 63.0 in | Wt 107.6 lb

## 2022-06-10 DIAGNOSIS — I272 Pulmonary hypertension, unspecified: Secondary | ICD-10-CM | POA: Diagnosis not present

## 2022-06-10 DIAGNOSIS — M25561 Pain in right knee: Secondary | ICD-10-CM

## 2022-06-10 DIAGNOSIS — G8929 Other chronic pain: Secondary | ICD-10-CM

## 2022-06-10 DIAGNOSIS — M1711 Unilateral primary osteoarthritis, right knee: Secondary | ICD-10-CM | POA: Diagnosis not present

## 2022-06-10 LAB — COMPREHENSIVE METABOLIC PANEL
ALT: 9 U/L (ref 0–35)
AST: 19 U/L (ref 0–37)
Albumin: 4.3 g/dL (ref 3.5–5.2)
Alkaline Phosphatase: 62 U/L (ref 39–117)
BUN: 29 mg/dL — ABNORMAL HIGH (ref 6–23)
CO2: 29 mEq/L (ref 19–32)
Calcium: 9.7 mg/dL (ref 8.4–10.5)
Chloride: 98 mEq/L (ref 96–112)
Creatinine, Ser: 1.33 mg/dL — ABNORMAL HIGH (ref 0.40–1.20)
GFR: 36.92 mL/min — ABNORMAL LOW (ref >60.00)
Glucose, Bld: 101 mg/dL — ABNORMAL HIGH (ref 70–99)
Potassium: 4.3 mEq/L (ref 3.5–5.1)
Sodium: 136 mEq/L (ref 135–145)
Total Bilirubin: 0.6 mg/dL (ref 0.2–1.2)
Total Protein: 6.8 g/dL (ref 6.0–8.3)

## 2022-06-10 LAB — CBC
HCT: 34.2 % — ABNORMAL LOW (ref 36.0–46.0)
Hemoglobin: 11.4 g/dL — ABNORMAL LOW (ref 12.0–15.0)
MCHC: 33.3 g/dL (ref 30.0–36.0)
MCV: 106.4 fl — ABNORMAL HIGH (ref 78.0–100.0)
Platelets: 218 10*3/uL (ref 150.0–400.0)
RBC: 3.22 Mil/uL — ABNORMAL LOW (ref 3.87–5.11)
RDW: 13.8 % (ref 11.5–15.5)
WBC: 6.5 10*3/uL (ref 4.0–10.5)

## 2022-06-10 NOTE — Patient Instructions (Addendum)
Thank you for coming in today.   Please use Voltaren gel (Generic Diclofenac Gel) up to 4x daily for pain as needed.  This is available over-the-counter as both the name brand Voltaren gel and the generic diclofenac gel.   Please get an Xray today before you leave   Use a walker  Tylenol is fine to take  Check back in 2 weeks

## 2022-06-10 NOTE — Progress Notes (Signed)
   I, Peterson Lombard, LAT, ATC acting as a scribe for Lynne Leader, MD.  Subjective:    CC: R knee pain  HPI: Pt is an 86 y/o female c/o R knee pain x 1.5 weeks. Pt was cleaning the baseboards in the corner and pulled her R leg over and felt a "pull" in anterior aspect of the R knee. Pt locates pain to the anterior-medial aspect of the R knee.  R Knee swelling: yes Mechanical symptoms: no Aggravates: transitioning to stand Treatments tried: heat, ice, Tylenol  Pertinent review of Systems: No fevers or chills  Relevant historical information: Pulmonary hypertension.  Interstitial lung disease.  Hypertension.   Objective:    Vitals:   06/10/22 1345  BP: 122/64  Pulse: 80  SpO2: 96%   General: Well Developed, well nourished, and in no acute distress.   MSK: Right knee: Mild effusion.  Swollen and erythematous appearing at anterior medial knee near the joint line. Normal motion. Tender palpation anterior medial knee at the medial knee near the joint line and proximal tibia. Intact strength. Stable ligamentous exam. Positive Murray's test.   Pulses capillary refill and sensation are intact distally.  Lab and Radiology Results  Diagnostic Limited MSK Ultrasound of: Right knee Quad tendon intact normal-appearing Small joint effusion present superior patellar space. Patella tendon intact normal. Lateral joint line degenerative appearing but otherwise normal appearing Medial joint line degenerative appearing medial meniscus. At medial proximal tibia there is a small cortical defect with area of increased Doppler activity which is concerning for potential fracture. Impression: Possible tibial plateau fracture.  Medial DJD.   X-ray images right knee obtained today personally and independently interpreted No definitive fracture is present.  Medial compartment degenerative changes and chondrocalcinosis present medial joint line. Await formal radiology review  Impression and  Recommendations:    Assessment and Plan: 86 y.o. female with medial knee pain after crawling on the bathroom floor to do some cleaning.  Contusion versus radiographically occult tibial plateau fracture.  Plan for reduced weightbearing using a walker to ambulate.  She has her husband's walker which she can adjust.  Recommend Tylenol and Voltaren gel.  Check back in 2 weeks.  At that time if still hurting may consider repeat x-ray or even injection.  Ultimately may need MRI to further evaluate this.Marland Kitchen  PDMP not reviewed this encounter. Orders Placed This Encounter  Procedures   [place] LIMITED JOINT SPACE STRUCTURES LOW RIGHT(NO LINKED CHARGES)    Order Specific Question:   Reason for Exam (SYMPTOM  OR DIAGNOSIS REQUIRED)    Answer:   right knee pain    Order Specific Question:   Preferred imaging location?    Answer:   [place]   DG Knee AP/LAT W/Sun[place] Right    Standing Status:   Future    Standing Expiration Date:   07/11/2022    Order Specific Question:   Reason for Exam (SYMPTOM  OR DIAGNOSIS REQUIRED)    Answer:   right knee pain    Order Specific Question:   Preferred imaging location?    Answer:   [facility]   No orders of the defined types were placed in this encounter.   Discussed warning signs or symptoms. Please see discharge instructions. Patient expresses understanding.   The above documentation has been reviewed and is accurate and complete Lynne Leader, M.D.

## 2022-06-11 ENCOUNTER — Encounter: Payer: Self-pay | Admitting: Internal Medicine

## 2022-06-12 ENCOUNTER — Encounter: Payer: Self-pay | Admitting: Internal Medicine

## 2022-06-12 NOTE — Progress Notes (Signed)
Right knee x-ray shows some mild arthritis changes.

## 2022-06-13 ENCOUNTER — Telehealth: Payer: Self-pay | Admitting: Gastroenterology

## 2022-06-13 NOTE — Telephone Encounter (Signed)
Advised to try Lidocaine pain relief patches. Call the patient on Tuesday to see if this is helpful.

## 2022-06-13 NOTE — Telephone Encounter (Signed)
Inbound call from patient stating she has sprained her knee and has been advise to take ibprophen and alieve. Patient states she would like to speak with a provider in regards if it is ok for her to take the medication due to her having stomach bleeds. Please give a call back to further advise.  Thank you

## 2022-06-18 NOTE — Telephone Encounter (Signed)
No answer. No voicemail.

## 2022-06-19 ENCOUNTER — Ambulatory Visit
Admission: EM | Admit: 2022-06-19 | Discharge: 2022-06-19 | Disposition: A | Discharge status: Home / Self Care | Payer: Medicare PPO | Attending: Emergency Medicine | Admitting: Emergency Medicine

## 2022-06-19 DIAGNOSIS — S81012A Laceration without foreign body, left knee, initial encounter: Secondary | ICD-10-CM | POA: Diagnosis not present

## 2022-06-19 MED ORDER — CEPHALEXIN 500 MG PO CAPS: 500.0000 mg | ORAL_CAPSULE | Freq: Two times a day (BID) | ORAL | 0 refills | Status: AC

## 2022-06-19 NOTE — ED Provider Notes (Signed)
[facility]    CSN: [id number] Arrival date & time: 06/19/22  1548    HISTORY   Chief Complaint  Patient presents with   Fall   HPI Paula Ward is a pleasant, 86 y.o. female who presents to urgent care today. Patient reports falling in her front yard landing on her left knee causing a laceration across her knee.  Patient states she cleaned the wound and came to urgent care to see if she needed stitches.  Patient states she did not hit her head when she fell, denies lacerations or abrasions elsewhere on her body, states her right knee is also little bit sore but does not appear to be injured, denies loss of consciousness or confusion.  Patient states she believes that she tripped over some uneven ground.  The history is provided by the patient.   Past Medical History:  Diagnosis Date   Anemia    Angiodysplasia of stomach and duodenum    Arthus phenomenon    AVM (arteriovenous malformation)    Blood transfusion without reported diagnosis    last 4 units 12-22-15, Iron infusion x2 last -01-07-16,01-14-16.   Breast cancer ([place]) 1989   Left   Candida esophagitis (HCC)    Cataract    Chronic kidney disease    Chronic mild renal insuffiency   CREST syndrome (HCC)    GERD (gastroesophageal reflux disease)    w/ HH   Interstitial lung disease (HCC)    Nodule of kidney    Pericardial effusion    PONV (postoperative nausea and vomiting)    Pulmonary embolus ([place]) 2003   Pulmonary hypertension ([place])    followed by Dr Gaynell Face at [facility], now Dr. Maryjean Ka visit end 2'17.   Rectal incontinence    Renal cell carcinoma (HCC)    Scleroderma (HCC)    Tubular adenoma of colon    Uterine polyp    Patient Active Problem List   Diagnosis Date Noted   Moderate aortic stenosis 04/26/2022   Protein-calorie malnutrition, severe 03/20/2022   AKI (acute kidney injury) ([place])    CAP (community acquired pneumonia) 03/18/2022   Pressure injury of skin 03/18/2022   Chest pain,  atypical 03/17/2022   Aortic stenosis, moderate 10/29/2021   Generalized weakness 03/29/2021   Acute on chronic diastolic CHF (congestive heart failure) ([place]) 03/29/2021   Elevated troponin 03/29/2021   AVM (arteriovenous malformation) of small bowel, acquired    SOB (shortness of breath) on exertion 03/08/2021   Anemia, chronic disease 12/25/2020   Fatigue 12/25/2020   Hoarseness 07/24/2020   Globus pharyngeus 08/19/2019   Drowsiness 12/17/2018   Leg swelling 09/03/2018   Orthopnea 06/24/2018   Dry mouth 02/15/2018   Allergic rhinitis 11/05/2017   Cerebellar 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 ([place]) 10/26/2017   Routine general medical examination at a health care facility 01/30/2017   Community acquired pneumonia 08/19/2016   Pericardial effusion 01/21/2016   Melena    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[place])    Osteoarthritis 02/25/2015   Mass of left side of neck    History of pulmonary embolism 01/29/2013   CKD (chronic kidney disease) stage 3, GFR 30-59 ml/min (HCC) 09/01/2012   RECTAL INCONTINENCE 03/27/2009   Angiodysplasia of stomach and duodenum 08/22/2008   ADENOCARCINOMA, BREAST, LEFT 07/26/2007   Anemia, iron deficiency 07/26/2007   Essential hypertension 07/26/2007   Pulmonary hypertension ([place]) 07/26/2007   GASTROESOPHAGEAL REFLUX DISEASE 07/26/2007   SCLERODERMA 07/26/2007   ILD (interstitial lung disease) ([place]) 07/26/2007  PULMONARY EMBOLISM, HX OF 07/26/2007   Past Surgical History:  Procedure Laterality Date   APPENDECTOMY  1962   BREAST LUMPECTOMY  1989   left   CATARACT EXTRACTION, BILATERAL Bilateral 12/2013   COLONOSCOPY WITH PROPOFOL N/A 04/15/2021   Procedure: COLONOSCOPY WITH PROPOFOL;  Surgeon: Milus Banister, MD;  Location: [facility];  Service: Endoscopy;  Laterality: N/A;   ENTEROSCOPY N/A 01/18/2016   Procedure: ENTEROSCOPY;  Surgeon: Mauri Pole, MD;  [facility];  Service: Endoscopy;  Laterality: N/A;   ENTEROSCOPY  N/A 05/24/2018   Procedure: ENTEROSCOPY;  Surgeon: Jackquline Denmark, MD;  [facility];  Service: Endoscopy;  Laterality: N/A;   ENTEROSCOPY N/A 03/29/2021   Procedure: ENTEROSCOPY;  Surgeon: Jerene Bears, MD;  [facility];  Service: Gastroenterology;  Laterality: N/A;   ENTEROSCOPY N/A 04/13/2021   Procedure: ENTEROSCOPY;  Surgeon: Yetta Flock, MD;  [facility];  Service: Gastroenterology;  Laterality: N/A;   ESOPHAGOGASTRODUODENOSCOPY (EGD) WITH PROPOFOL N/A 12/21/2015   Procedure: ESOPHAGOGASTRODUODENOSCOPY (EGD) WITH PROPOFOL;  Surgeon: Irene Shipper, MD;  [facility];  Service: Endoscopy;  Laterality: N/A;   HOT HEMOSTASIS N/A 05/24/2018   Procedure: HOT HEMOSTASIS (ARGON PLASMA COAGULATION/BICAP);  Surgeon: Jackquline Denmark, MD;  [facility];  Service: Endoscopy;  Laterality: N/A;   HOT HEMOSTASIS N/A 03/29/2021   Procedure: HOT HEMOSTASIS (ARGON PLASMA COAGULATION/BICAP);  Surgeon: Jerene Bears, MD;  Location: [facility];  Service: Gastroenterology;  Laterality: N/A;   HOT HEMOSTASIS N/A 04/13/2021   Procedure: HOT HEMOSTASIS (ARGON PLASMA COAGULATION/BICAP);  Surgeon: Yetta Flock, MD;  Location[facility];  Service: Gastroenterology;  Laterality: N/A;   IR GENERIC HISTORICAL  06/05/2016   IR RADIOLOGIST EVAL & MGMT 06/05/2016 Aletta Edouard, MD [facility] INTER[facility] RAD   IVC Filter     KIDNEY SURGERY     left -"laser surgery by Dr. Kathlene Cote- 5 yrs ago" no removal   RIGHT/LEFT HEART CATH AND CORONARY ANGIOGRAPHY N/A 04/25/2022   Procedure: RIGHT/LEFT HEART CATH AND CORONARY ANGIOGRAPHY;  Surgeon: Sherren Mocha, MD;  Location: [facility];  Service: Cardiovascular;  Laterality: N/A;   SCHLEROTHERAPY  05/24/2018   Procedure: [place] Ku;  Surgeon: Jackquline Denmark, MD;  [facility];  Service: Endoscopy;;   SUBMUCOSAL TATTOO INJECTION  04/13/2021   Procedure: SUBMUCOSAL TATTOO INJECTION;  Surgeon: Yetta Flock,  MD;  [facility];  Service: Gastroenterology;;   TONSILLECTOMY     TUBAL LIGATION     OB History   No obstetric history on file.    Home Medications    Prior to Admission medications   Medication Sig Start Date End Date Taking? Authorizing Provider  acetaminophen (TYLENOL) 325 MG tablet Take 650 mg by mouth every 6 (six) hours as needed for mild pain.     [provider]  albuterol (VENTOLIN HFA) 108 (90 Base) MCG/ACT inhaler INHALE 2 PUFFS INTO THE LUNGS EVERY 6 HOURS AS NEEDED FOR WHEEZING OR SHORTNESS OF BREATH 03/06/20   Hoyt Koch, MD  ALPRAZolam Duanne Moron) 0.25 MG tablet Take 0.5 tablets (0.125 mg total) by mouth at bedtime as needed. 10/29/20   Hoyt Koch, MD  chlorhexidine (PERIDEX) 0.12 % solution 15 mLs by Mouth Rinse route 2 (two) times daily as needed (sore mouth). 03/31/22   Hoyt Koch, MD  Cyanocobalamin (VITAMIN B-12 PO) Take 1 tablet by mouth 2 (two) times a week.    [provider]  diltiazem (CARDIZEM CD) 120 MG 24 hr  capsule Take 120 mg by mouth in the morning.    [provider]  fluticasone (FLONASE) 50 MCG/ACT nasal spray Place 1 spray into both nostrils daily as needed for allergies. 05/15/22   Hoyt Koch, MD  furosemide (LASIX) 20 MG tablet Take 1 tablet by mouth once daily 03/25/22   Hoyt Koch, MD  guaiFENesin (MUCINEX) 600 MG 12 hr tablet Take 150 mg by mouth daily.    [provider]  ipratropium (ATROVENT) 0.03 % nasal spray Place 2 sprays into both nostrils every 12 (twelve) hours. 05/12/22   Freddi Starr, MD  magic mouthwash SOLN Take 10 mLs by mouth 4 (four) times daily as needed for mouth pain. 03/22/22   Shelly Coss, MD  montelukast (SINGULAIR) 10 MG tablet TAKE 1 TABLET BY MOUTH AT BEDTIME 03/03/22   Hoyt Koch, MD  Multiple Vitamins-Iron (MULTIVITAMIN/IRON PO) Take 1 tablet by mouth daily.    [provider]  Multiple Vitamins-Minerals  (ZINC PO) Take 1 tablet by mouth daily.    [provider]  OPSUMIT 10 MG TABS Take 10 mg by mouth daily. 08/16/15   [provider]  OXYGEN Inhale into the lungs. 2 LMP at night and upon exertion    [provider]  pantoprazole (PROTONIX) 40 MG tablet Take 1 tablet (40 mg total) by mouth 2 (two) times daily before a meal. 02/27/22   Nandigam, Venia Minks, MD  Pyridoxine HCl (VITAMIN B6 PO) Take 1 tablet by mouth as needed. Unsure of dose    [provider]  spironolactone (ALDACTONE) 25 MG tablet Take 12.5 mg by mouth daily. 06/09/20   [provider]  sucralfate (CARAFATE) 1 GM/10ML suspension Take 1 g by mouth every 6 (six) hours as needed (reflux). 03/01/22   [provider]  triamcinolone (KENALOG) 0.1 % paste Use as directed 1 Application in the mouth or throat 3 (three) times daily as needed (sore mouth). 04/05/22   [provider]    Family History Family History  Problem Relation Age of Onset   Bladder Cancer Father    Diabetes Father    Prostate cancer Father    Alzheimer's disease Mother    Diabetes Sister    Lung cancer Sister         smoker   Esophageal cancer Paternal Uncle    Colon cancer Neg Hx    Social History Social History   Tobacco Use   Smoking status: Never   Smokeless tobacco: Never  Vaping Use   Vaping Use: Never used  Substance Use Topics   Alcohol use: No   Drug use: No   Allergies   Codeine, Other, Erythromycin, Iodinated contrast media, Lisinopril, Mycophenolate mofetil, and Sulfa antibiotics  Review of Systems Review of Systems Pertinent findings revealed after performing a 14 point review of systems has been noted in the history of present illness.  Physical Exam Triage Vital Signs ED Triage Vitals  Enc Vitals Group     BP 08/16/21 0827 (!) 147/82     Pulse Rate 08/16/21 0827 72     Resp 08/16/21 0827 18     Temp 08/16/21 0827 98.3 F (36.8 C)     Temp Source 08/16/21 0827 Oral      SpO2 08/16/21 0827 98 %     Weight --      Height --      Head Circumference --      Peak Flow --  Pain Score 08/16/21 0826 5     Pain Loc --      Pain Edu? --      Excl. in [place]? --   No data found.  Updated Vital Signs BP (!) 119/55 (BP Location: Right Arm)   Pulse 83   Temp 98.1 F (36.7 C) (Oral)   Resp 16   SpO2 95%   Physical Exam Vitals and nursing note reviewed.  Constitutional:      General: She is not in acute distress.    Appearance: Normal appearance.  HENT:     Head: Normocephalic and atraumatic.  Eyes:     Pupils: Pupils are equal, round, and reactive to light.  Cardiovascular:     Rate and Rhythm: Normal rate and regular rhythm.  Pulmonary:     Effort: Pulmonary effort is normal.     Breath sounds: Normal breath sounds.  Musculoskeletal:        General: Normal range of motion.     Cervical back: Normal range of motion and neck supple.  Skin:    General: Skin is warm and dry.     Findings: Lesion (4 cm laceration on anterior aspect of left knee, scant bleeding, wound is clean without surrounding erythema, induration.  Mild ecchymoses is noted around the laceration) present.  Neurological:     General: No focal deficit present.     Mental Status: She is alert and oriented to person, place, and time. Mental status is at baseline.  Psychiatric:        Mood and Affect: Mood normal.        Behavior: Behavior normal.        Thought Content: Thought content normal.        Judgment: Judgment normal.     Visual Acuity Right Eye Distance:   Left Eye Distance:   Bilateral Distance:    Right Eye Near:   Left Eye Near:    Bilateral Near:     UC Couse / Diagnostics / Procedures:     Radiology No results found.  Procedures Laceration Repair  Date/Time: 06/19/2022 4:28 PM  Performed by: Lynden Oxford Scales, PA-C Authorized by: Lynden Oxford Scales, PA-C   Consent:    Consent obtained:  Verbal   Consent given by:  Patient   Risks,  benefits, and alternatives were discussed: yes     Risks discussed:  Infection, pain, poor cosmetic result, poor wound healing and need for additional repair   Alternatives discussed:  No treatment, delayed treatment, observation and referral Universal protocol:    Procedure explained and questions answered to patient or proxy's satisfaction: yes     Patient identity confirmed:  Verbally with patient and arm band Anesthesia:    Anesthesia method:  Local infiltration   Local anesthetic:  Lidocaine 1% w/o epi Laceration details:    Location:  Leg   Leg location:  L knee   Length (cm):  4   Depth (mm):  2 Pre-procedure details:    Preparation:  Patient was prepped and draped in usual sterile fashion Exploration:    Limited defect created (wound extended): no     Hemostasis achieved with:  Direct pressure   Imaging outcome: foreign body not noted     Wound exploration: wound explored through full range of motion     Wound extent: no fascia violation noted, no foreign bodies/material noted, no muscle damage noted, no nerve damage noted, no tendon damage noted, no underlying fracture noted and no vascular  damage noted     Contaminated: no   Treatment:    Area cleansed with:  Povidone-iodine   Amount of cleaning:  Standard   Debridement:  None   Undermining:  None   Scar revision: no   Skin repair:    Repair method:  Sutures   Suture size:  5-0   Suture material:  Prolene   Suture technique:  Simple interrupted   Number of sutures:  6 Approximation:    Approximation:  Close Repair type:    Repair type:  Simple Post-procedure details:    Dressing:  Antibiotic ointment and non-adherent dressing   Procedure completion:  Tolerated  (including critical care time) EKG  Pending results:  Labs Reviewed - No data to display  Medications Ordered in UC: Medications - No data to display  UC Diagnoses / Final Clinical Impressions(s)   I have reviewed the triage vital signs and the  nursing notes.  Pertinent labs & imaging results that were available during my care of the patient were reviewed by me and considered in my medical decision making (see chart for details).    Final diagnoses:  Laceration of left knee, initial encounter   Patient tolerated suture procedure well and was advised to begin Keflex 1 capsule twice daily for 5 days given reduced renal function.  Self-care of laceration instructions provided.  Return precautions advised.  Patient advised to return in 7 days to have stitches removed.  ED Prescriptions     Medication Sig Dispense Auth. Provider   cephALEXin (KEFLEX) 500 MG capsule Take 1 capsule (500 mg total) by mouth 2 (two) times daily for 5 days. 10 capsule Lynden Oxford Scales, PA-C      PDMP not reviewed this encounter.  Pending results:  Labs Reviewed - No data to display  Discharge Instructions:   Discharge Instructions      Please leave your bandage in place for the next 24 hours.  Continue daily bandage changes there is no more bleeding or any drainage from the wound.  You are welcome to take shower after removing your bandage but please do not soak in a tub until your sutures are removed.  Please do not aggressively wash the wound, simply allow the soapy water to run over it and rinse it clean.  I sent a prescription for Keflex to your pharmacy, please take 1 capsule twice daily for the next 5 days.  This antibiotic is being used to prevent infection from developing in the wound.  Please return Friday a week to have your sutures removed.  I will be happy to remove them for you.  If you notice that the wound is becoming more painful, becoming swollen, red, draining purulent material, becomes foul-smelling please return sooner for further evaluation.  You may require stronger antibiotics.  Thank you for visiting urgent care today.      Disposition Upon Discharge:  Condition: stable for discharge home  Patient presented  with an acute illness with associated systemic symptoms and significant discomfort requiring urgent management. In my opinion, this is a condition that a prudent lay person (someone who possesses an average knowledge of health and medicine) may potentially expect to result in complications if not addressed urgently such as respiratory distress, impairment of bodily function or dysfunction of bodily organs.   Routine symptom specific, illness specific and/or disease specific instructions were discussed with the patient and/or caregiver at length.   As such, the patient has been evaluated and assessed, work-up was  performed and treatment was provided in alignment with urgent care protocols and evidence based medicine.  Patient/parent/caregiver has been advised that the patient may require follow up for further testing and treatment if the symptoms continue in spite of treatment, as clinically indicated and appropriate.  Patient/parent/caregiver has been advised to return to the [facility] or PCP if no better; to PCP or the Emergency Department if new signs and symptoms develop, or if the current signs or symptoms continue to change or worsen for further workup, evaluation and treatment as clinically indicated and appropriate  The patient will follow up with their current PCP if and as advised. If the patient does not currently have a PCP we will assist them in obtaining one.   The patient may need specialty follow up if the symptoms continue, in spite of conservative treatment and management, for further workup, evaluation, consultation and treatment as clinically indicated and appropriate.   Patient/parent/caregiver verbalized understanding and agreement of plan as discussed.  All questions were addressed during visit.  Please see discharge instructions below for further details of plan.  This office note has been dictated using Museum/gallery curator.  Unfortunately, this method of dictation can  sometimes lead to typographical or grammatical errors.  I apologize for your inconvenience in advance if this occurs.  Please do not hesitate to reach out to me if clarification is needed.      Lynden Oxford Scales, PA-C 06/21/22 1718

## 2022-06-19 NOTE — ED Triage Notes (Addendum)
The patient states she had a fall and obtained a skin tear to the left knee, she states a fall last week has her right knee aching. There is a skin tear to left knee with small amount of bleeding.

## 2022-06-19 NOTE — Discharge Instructions (Addendum)
Please leave your bandage in place for the next 24 hours.  Continue daily bandage changes there is no more bleeding or any drainage from the wound.  You are welcome to take shower after removing your bandage but please do not soak in a tub until your sutures are removed.  Please do not aggressively wash the wound, simply allow the soapy water to run over it and rinse it clean.  I sent a prescription for Keflex to your pharmacy, please take 1 capsule twice daily for the next 5 days.  This antibiotic is being used to prevent infection from developing in the wound.  Please return Friday a week to have your sutures removed.  I will be happy to remove them for you.  If you notice that the wound is becoming more painful, becoming swollen, red, draining purulent material, becomes foul-smelling please return sooner for further evaluation.  You may require stronger antibiotics.  Thank you for visiting urgent care today.

## 2022-06-24 ENCOUNTER — Ambulatory Visit: Payer: Medicare PPO | Admitting: Family Medicine

## 2022-06-24 VITALS — BP 106/52 | HR 92 | Ht 63.0 in | Wt 106.0 lb

## 2022-06-24 DIAGNOSIS — M25561 Pain in right knee: Secondary | ICD-10-CM

## 2022-06-24 DIAGNOSIS — M25562 Pain in left knee: Secondary | ICD-10-CM

## 2022-06-24 DIAGNOSIS — M79661 Pain in right lower leg: Secondary | ICD-10-CM | POA: Diagnosis not present

## 2022-06-24 DIAGNOSIS — M79662 Pain in left lower leg: Secondary | ICD-10-CM | POA: Diagnosis not present

## 2022-06-24 NOTE — Progress Notes (Signed)
   I, Peterson Lombard, LAT, ATC acting as a scribe for Lynne Leader, MD.  Paula Ward is a 86 y.o. female who presents to [place] at [facility] today for f/u R knee pain cause by crawling on the bathroom floor to do some cleaning. Pt was last seen by Dr. Georgina Snell on 06/10/22 and was advised to reduce weight bearing using a walker, take Tylenol, and use Voltaren gel. Of note, pt was seen at [facility] on 8/31 w/ a 4cm laceration on her L knee sustained from a fall in her front yard. Today, pt reports that 2 weeks ago she landed on recycling bin lid. Had to get 6 stitches in L knee. Also has fluid in L ankle which is concerning to her. After falling she has pain in medial aspect of R knee that was tender to the pillow resting on it. Also c/o R calf tightness.   She is worried about right calf swelling and tightness.  She notes that she has had a history of blood clots and is worried about a DVT.  Dx imaging: 06/10/22 R knee XR  Pertinent review of systems: No fevers or chills  Relevant historical information: Pulmonary hypertension   Exam:  BP (!) 106/52   Pulse 92   Ht _0  (1.6 m)   Wt 106 lb (48.1 kg)   SpO2 95%   BMI 18.78 kg/m  General: Well Developed, well nourished, and in no acute distress.   MSK: Right knee: Normal-appearing no significant swelling. Tender palpation anterior knee. Normal motion with intact strength.  Left knee: Well-appearing laceration anterior knee with no in therapy with no erythema at laceration.  Normal knee motion and intact strength.  Right calf: Mild calf swelling right compared to left.  Bilateral mild lower extremity edema. The calf is mildly tender to palpation.  Assessment and Plan: 86 y.o. female with bilateral anterior knee pain.  Right anterior knee anterior contusion now improving.  Plan for continued conservative management strategies.  I would like to refer to physical therapy but she deferred today.  We will reassess in 1  month.  Left anterior knee pain due to contusion and laceration.  The laceration looks good.  She is due to have her sutures removed later this week.  Right calf swelling.  Mildly concerning.  We will proceed with vascular ultrasound to assess for DVT.   PDMP not reviewed this encounter. No orders of the defined types were placed in this encounter.  No orders of the defined types were placed in this encounter.    Discussed warning signs or symptoms. Please see discharge instructions. Patient expresses understanding.   The above documentation has been reviewed and is accurate and complete Lynne Leader, M.D.

## 2022-06-24 NOTE — Patient Instructions (Signed)
[facility]  ([address] [employer] in [facility]) [address] [place], [place] [address] [phone]  Let's see what the test says

## 2022-06-25 ENCOUNTER — Ambulatory Visit (HOSPITAL_COMMUNITY)
Admission: RE | Admit: 2022-06-25 | Discharge: 2022-06-25 | Disposition: A | Discharge status: Home / Self Care | Payer: Medicare PPO | Source: Ambulatory Visit | Attending: Cardiology | Admitting: Cardiology

## 2022-06-25 DIAGNOSIS — M79661 Pain in right lower leg: Secondary | ICD-10-CM | POA: Insufficient documentation

## 2022-06-25 DIAGNOSIS — M79662 Pain in left lower leg: Secondary | ICD-10-CM | POA: Diagnosis not present

## 2022-06-26 ENCOUNTER — Telehealth: Payer: Self-pay | Admitting: Family Medicine

## 2022-06-26 NOTE — Progress Notes (Signed)
Vascular ultrasound shows no evidence of DVT or blood clot.  It does show evidence of edema.

## 2022-06-26 NOTE — Telephone Encounter (Signed)
Pt called, has viewed results of the DVT test she had yesterday in [place].  Wondering what Dr. Georgina Snell would advise her to do about the swelling/fluid noted in result.

## 2022-06-27 ENCOUNTER — Ambulatory Visit
Admission: EM | Admit: 2022-06-27 | Discharge: 2022-06-27 | Disposition: A | Discharge status: Home / Self Care | Payer: Medicare PPO

## 2022-06-27 NOTE — Telephone Encounter (Signed)
The vascular ultrasound shows that the fluid in your leg is edema.  Compression stockings or changing your heart medicines could help.  I recommend trying compression stockings first.  If this does not help your heart medicines could be modified to either add fluid pills or reduce one of your other medicines.  My recommendation is to try the compression stockings first.

## 2022-06-27 NOTE — ED Notes (Signed)
Wound was assessed by provider and myself. A a total of 6 sutures intact, no s/s of infection noted. Wound appeared to be well healed . Procedure tolerated well by pt. Wound care edu reviewed.

## 2022-06-27 NOTE — ED Triage Notes (Signed)
Patient presents to UC for wound check and suture removal. She states she had sutures placed 08/31. States she has concern with swelling to her extremity.

## 2022-06-30 ENCOUNTER — Other Ambulatory Visit: Payer: Self-pay | Admitting: Internal Medicine

## 2022-06-30 DIAGNOSIS — M25561 Pain in right knee: Secondary | ICD-10-CM | POA: Diagnosis not present

## 2022-06-30 NOTE — Telephone Encounter (Signed)
Sent Dr. Clovis Riley response to pt via MyChart.

## 2022-07-03 ENCOUNTER — Encounter: Payer: Self-pay | Admitting: Pulmonary Disease

## 2022-07-04 ENCOUNTER — Ambulatory Visit (HOSPITAL_COMMUNITY)
Admission: RE | Admit: 2022-07-04 | Discharge: 2022-07-04 | Disposition: A | Discharge status: Home / Self Care | Payer: Medicare PPO | Source: Ambulatory Visit | Attending: Nephrology | Admitting: Nephrology

## 2022-07-04 VITALS — BP 135/62 | HR 74 | Temp 97.4°F | Resp 18

## 2022-07-04 DIAGNOSIS — D638 Anemia in other chronic diseases classified elsewhere: Secondary | ICD-10-CM | POA: Diagnosis not present

## 2022-07-04 DIAGNOSIS — N1832 Chronic kidney disease, stage 3b: Secondary | ICD-10-CM | POA: Diagnosis not present

## 2022-07-04 LAB — FERRITIN: Ferritin: 895 ng/mL — ABNORMAL HIGH (ref 11–307)

## 2022-07-04 LAB — IRON AND TIBC
Iron: 197 ug/dL — ABNORMAL HIGH (ref 28–170)
Saturation Ratios: 87 % — ABNORMAL HIGH (ref 10.4–31.8)
TIBC: 227 ug/dL — ABNORMAL LOW (ref 250–450)
UIBC: 30 ug/dL

## 2022-07-04 LAB — POCT HEMOGLOBIN-HEMACUE: Hemoglobin: 12.1 g/dL (ref 12.0–15.0)

## 2022-07-04 MED ORDER — EPOETIN ALFA-EPBX 10000 UNIT/ML IJ SOLN: 10000.0000 [IU] | INTRAMUSCULAR | Status: DC

## 2022-07-07 NOTE — Telephone Encounter (Signed)
Dr Erin Fulling, please see the email from pt. I have asked her a few questions about the pred. Please advise if you want her to come in for ov to discuss.    Hi Paula Ward, I am glad that you are feeling better. We will check with Dr Erin Fulling about the prednisone. Prednisone comes with some long term side effects that should be considered. How much had you been taking before? How long did you take it?   This MyChart message has not been read.   Paula Ward [facility] (supporting Freddi Starr, MD) 4 days ago     Since I am not under [facility]s care now if you could look in my [facility] files [phone]) and see where Dr.Swaminathan prescribed a low dosage of prednisone  to help with my breathing. I am finding out that it does help with my breathing.  Two weeks ago I hurt my knee and was put on prednisone and  I have been able to breath better.   Thank you for checking on this matter for me.   Paula Ward. Paula Ward

## 2022-07-08 ENCOUNTER — Telehealth: Payer: Self-pay | Admitting: Internal Medicine

## 2022-07-08 DIAGNOSIS — J8410 Pulmonary fibrosis, unspecified: Secondary | ICD-10-CM | POA: Diagnosis not present

## 2022-07-08 DIAGNOSIS — I509 Heart failure, unspecified: Secondary | ICD-10-CM | POA: Diagnosis not present

## 2022-07-08 DIAGNOSIS — J309 Allergic rhinitis, unspecified: Secondary | ICD-10-CM | POA: Diagnosis not present

## 2022-07-08 DIAGNOSIS — F419 Anxiety disorder, unspecified: Secondary | ICD-10-CM | POA: Diagnosis not present

## 2022-07-08 DIAGNOSIS — J9611 Chronic respiratory failure with hypoxia: Secondary | ICD-10-CM | POA: Diagnosis not present

## 2022-07-08 DIAGNOSIS — I2729 Other secondary pulmonary hypertension: Secondary | ICD-10-CM | POA: Diagnosis not present

## 2022-07-08 DIAGNOSIS — E261 Secondary hyperaldosteronism: Secondary | ICD-10-CM | POA: Diagnosis not present

## 2022-07-08 DIAGNOSIS — I130 Hypertensive heart and chronic kidney disease with heart failure and stage 1 through stage 4 chronic kidney disease, or unspecified chronic kidney disease: Secondary | ICD-10-CM | POA: Diagnosis not present

## 2022-07-08 DIAGNOSIS — I951 Orthostatic hypotension: Secondary | ICD-10-CM | POA: Diagnosis not present

## 2022-07-08 NOTE — Telephone Encounter (Signed)
PT visits today with BP readings from the last couple of days. These readings were done during PT's [place] visit.  Right Arm- 99/49 84 HR Left Arm- 92/40 84 HR Standing- 78/42 93 HR  She was informed to relay this information back to Dr.Crawford and staff. I had asked PT if she would like to try to get an appointment in with Dr.Crawford and she stated she would like Dr.Crawford to make that decision before going forward.  CB: [phone]

## 2022-07-08 NOTE — Telephone Encounter (Signed)
Please advise 

## 2022-07-09 ENCOUNTER — Emergency Department (HOSPITAL_BASED_OUTPATIENT_CLINIC_OR_DEPARTMENT_OTHER)
Admission: EM | Admit: 2022-07-09 | Discharge: 2022-07-09 | Disposition: A | Discharge status: Home / Self Care | Payer: Medicare PPO | Attending: Emergency Medicine | Admitting: Emergency Medicine

## 2022-07-09 ENCOUNTER — Encounter (HOSPITAL_BASED_OUTPATIENT_CLINIC_OR_DEPARTMENT_OTHER): Payer: Self-pay | Admitting: *Deleted

## 2022-07-09 DIAGNOSIS — Z20822 Contact with and (suspected) exposure to covid-19: Secondary | ICD-10-CM | POA: Diagnosis not present

## 2022-07-09 DIAGNOSIS — R531 Weakness: Secondary | ICD-10-CM | POA: Insufficient documentation

## 2022-07-09 DIAGNOSIS — R42 Dizziness and giddiness: Secondary | ICD-10-CM | POA: Insufficient documentation

## 2022-07-09 LAB — BASIC METABOLIC PANEL
Anion gap: 8 (ref 5–15)
BUN: 49 mg/dL — ABNORMAL HIGH (ref 8–23)
CO2: 27 mmol/L (ref 22–32)
Calcium: 8.9 mg/dL (ref 8.9–10.3)
Chloride: 101 mmol/L (ref 98–111)
Creatinine, Ser: 1.21 mg/dL — ABNORMAL HIGH (ref 0.44–1.00)
GFR, Estimated: 44 mL/min — ABNORMAL LOW (ref >60)
Glucose, Bld: 101 mg/dL — ABNORMAL HIGH (ref 70–99)
Potassium: 4.7 mmol/L (ref 3.5–5.1)
Sodium: 136 mmol/L (ref 135–145)

## 2022-07-09 LAB — CBC WITH DIFFERENTIAL/PLATELET
Abs Immature Granulocytes: 0.05 10*3/uL (ref 0.00–0.07)
Basophils Absolute: 0 10*3/uL (ref 0.0–0.1)
Basophils Relative: 0 %
Eosinophils Absolute: 0.1 10*3/uL (ref 0.0–0.5)
Eosinophils Relative: 1 %
HCT: 33 % — ABNORMAL LOW (ref 36.0–46.0)
Hemoglobin: 10.7 g/dL — ABNORMAL LOW (ref 12.0–15.0)
Immature Granulocytes: 1 %
Lymphocytes Relative: 17 %
Lymphs Abs: 1.3 10*3/uL (ref 0.7–4.0)
MCH: 34.5 pg — ABNORMAL HIGH (ref 26.0–34.0)
MCHC: 32.4 g/dL (ref 30.0–36.0)
MCV: 106.5 fL — ABNORMAL HIGH (ref 80.0–100.0)
Monocytes Absolute: 0.6 10*3/uL (ref 0.1–1.0)
Monocytes Relative: 7 %
Neutro Abs: 5.7 10*3/uL (ref 1.7–7.7)
Neutrophils Relative %: 74 %
Platelets: 182 10*3/uL (ref 150–400)
RBC: 3.1 MIL/uL — ABNORMAL LOW (ref 3.87–5.11)
RDW: 13.2 % (ref 11.5–15.5)
WBC: 7.8 10*3/uL (ref 4.0–10.5)
nRBC: 0 % (ref 0.0–0.2)

## 2022-07-09 LAB — TROPONIN I (HIGH SENSITIVITY)
Troponin I (High Sensitivity): 31 ng/L — ABNORMAL HIGH (ref <18)
Troponin I (High Sensitivity): 35 ng/L — ABNORMAL HIGH (ref <18)

## 2022-07-09 LAB — RESP PANEL BY RT-PCR (FLU A&B, COVID) ARPGX2
Influenza A by PCR: NEGATIVE
Influenza B by PCR: NEGATIVE
SARS Coronavirus 2 by RT PCR: NEGATIVE

## 2022-07-09 MED ORDER — LACTATED RINGERS IV BOLUS
500.0000 mL | Freq: Once | INTRAVENOUS | Status: AC
  Administered 2022-07-09: 500 mL via INTRAVENOUS

## 2022-07-09 NOTE — ED Provider Notes (Signed)
[facility] DEPART[facility] Provider Note   CSN: [id number] Arrival date & time: 07/09/22  1602     History Chief Complaint  Patient presents with   Weakness    HPI Paula Ward is a 86 y.o. female presenting for weakness.  She states that she has been having fatigue and lightheadedness over the past 5 days.  She has not been eating or drinking as much.  She states that she has a fluid limitation secondary to heart failure.  She has been taking Lasix 20 mg daily.  She has been peeing less.  She denies any shortness of breath.  She states that when she stands from a chair she gets lightheaded.  Her blood pressure was 90 systolic when her physical therapist came by yesterday.  She has recently had an increased dose of diltiazem.  She held that dose today and does have mild symptomatic improvement today.   Patient's recorded medical, surgical, social, medication list and allergies were reviewed in the Snapshot window as part of the initial history.   Review of Systems   Review of Systems  Constitutional:  Positive for fatigue. Negative for chills and fever.  HENT:  Negative for ear pain and sore throat.   Eyes:  Negative for pain and visual disturbance.  Respiratory:  Negative for cough and shortness of breath.   Cardiovascular:  Negative for chest pain and palpitations.  Gastrointestinal:  Negative for abdominal pain and vomiting.  Genitourinary:  Negative for dysuria and hematuria.  Musculoskeletal:  Negative for arthralgias and back pain.  Skin:  Negative for color change and rash.  Neurological:  Positive for light-headedness. Negative for seizures and syncope.  All other systems reviewed and are negative.   Physical Exam Updated Vital Signs BP 139/60   Pulse 86   Temp 98.2 F (36.8 C)   Resp (!) 21   SpO2 98%  Physical Exam Vitals and nursing note reviewed.  Constitutional:      General: She is not in acute distress.    Appearance: She is  well-developed.  HENT:     Head: Normocephalic and atraumatic.  Eyes:     Conjunctiva/sclera: Conjunctivae normal.  Cardiovascular:     Rate and Rhythm: Normal rate and regular rhythm.     Heart sounds: No murmur heard. Pulmonary:     Effort: Pulmonary effort is normal. No respiratory distress.     Breath sounds: Normal breath sounds.  Abdominal:     Palpations: Abdomen is soft.     Tenderness: There is no abdominal tenderness.  Musculoskeletal:        General: No swelling.     Cervical back: Neck supple.  Skin:    General: Skin is warm and dry.     Capillary Refill: Capillary refill takes less than 2 seconds.  Neurological:     Mental Status: She is alert.  Psychiatric:        Mood and Affect: Mood normal.      ED Course/ Medical Decision Making/ A&P    Procedures Procedures   Medications Ordered in ED Medications  lactated ringers bolus 500 mL (500 mLs Intravenous New Bag/Given 07/09/22 1702)    Medical Decision Making:    Paula Ward is a 86 y.o. female who presented to the ED today with lightheaded episodes detailed above.     Patient's presentation is complicated by their history of multiple comorbid medical problems including heart failure and hypertension on chronic outpatient medication regimen.  Patient  placed on continuous vitals and telemetry monitoring while in ED which was reviewed periodically.   Complete initial physical exam performed, notably the patient  was hemodynamically stable in no acute distress.  Patient is ambulatory tolerating p.o. intake.      Reviewed and confirmed nursing documentation for past medical history, family history, social history.    Initial Assessment:   With the patient's presentation of episodes of lightheadedness, most likely diagnosis is presyncope likely orthostatic versus vasovagal. Other diagnoses were considered including (but not limited to) cardiogenic presyncope, ACS, pulmonary embolism, aortic dissection,  vertebral artery dissection. These are considered less likely due to history of present illness and physical exam findings.   This is most consistent with an acute life/limb threatening illness complicated by underlying chronic conditions.  Initial Plan:  We will trial fluid resuscitation with plan for reassessment. Screening labs including CBC and Metabolic panel to evaluate for infectious or metabolic etiology of disease.  Viral testing Delta troponin and EKG to evaluate for cardiac pathology. Objective evaluation as below reviewed with plan for close reassessment  Initial Study Results:   Laboratory  All laboratory results reviewed without evidence of clinically relevant pathology.   Exceptions include patient's troponin elevation 31-35.  On review of chart, this appears baseline for patient.  She denies chest pain shortness of breath  EKG EKG was reviewed independently. Rate, rhythm, axis, intervals all examined and without medically relevant abnormality. ST segments without concerns for elevations.  EKG was repeated and remained stable with prior EKGs  Final Assessment and Plan:   I was called back to bedside as patient states that she feels symptomatically improved and would like to be discharged.  Discussed the findings with the patient including her abnormal EKG, abnormal troponin.  She states she already has scheduled follow-up with her cardiologist and she preferred  to discuss the case with her cardiologist.  Informed her of the risk of cardiac arrest given her elevated risk and she expressed understanding but would prefer outpatient follow-up.  She is otherwise ambulatory tolerating p.o. intake at this time.  I favor that her presentation was likely secondary to dehydration in the setting of increased Lasix utilization and decreased fluid intake due to self-limiting recently.  As she is symptomatically improved, I believe she stable for outpatient care management.  Patient discharged  with no further acute events.    Clinical Impression:  1. Light headed      Discharge   Final Clinical Impression(s) / ED Diagnoses Final diagnoses:  Light headed    Rx / DC Orders ED Discharge Orders     None         Tretha Sciara, MD 07/09/22 2018

## 2022-07-09 NOTE — ED Triage Notes (Signed)
When ambulating and standing will become lightheaded. Feel like her BP is low at times. Denies chest pain, shortness of breath, N/V/D. Has had this going on for the past 3 days.

## 2022-07-09 NOTE — Discharge Instructions (Addendum)
You were seen today for lightheadedness.  Based on your story, this is likely a combination of dehydration in the setting of you increasing your Lasix dose recently. I recommend you take your Lasix every other day for right now.  And continue your other medications as scheduled.  Please call your cardiologist and your primary care provider to arrange close outpatient appointments.  Thank you for the opportunity to participate in your care, Tretha Sciara MD

## 2022-07-09 NOTE — Telephone Encounter (Signed)
Pt stated- having light headed and Wellness nurse told pt not to take the b/p medication due to low b/p. Pt decide to go to ER today.

## 2022-07-09 NOTE — Telephone Encounter (Signed)
Is she having any symptoms?

## 2022-07-10 ENCOUNTER — Encounter: Payer: Self-pay | Admitting: Internal Medicine

## 2022-07-15 ENCOUNTER — Encounter: Payer: Self-pay | Admitting: Internal Medicine

## 2022-07-15 ENCOUNTER — Other Ambulatory Visit: Payer: Self-pay | Admitting: Internal Medicine

## 2022-07-15 ENCOUNTER — Ambulatory Visit: Payer: Medicare PPO | Admitting: Internal Medicine

## 2022-07-15 DIAGNOSIS — I10 Essential (primary) hypertension: Secondary | ICD-10-CM

## 2022-07-15 NOTE — Progress Notes (Signed)
Subjective:   Patient ID: Paula Ward, female    DOB: 10-Jun-1936, 86 y.o.   MRN: [id number]  HPI The patient is an 86 YO female coming in for low BP. Has stopped diltiazem lately and BP still mildly low. Is taking lasix and spironolactone still. Feeling some better today.  Review of Systems  Constitutional: Negative.   HENT: Negative.    Eyes: Negative.   Respiratory:  Negative for cough, chest tightness and shortness of breath.   Cardiovascular:  Negative for chest pain, palpitations and leg swelling.  Gastrointestinal:  Negative for abdominal distention, abdominal pain, constipation, diarrhea, nausea and vomiting.  Musculoskeletal: Negative.   Skin: Negative.   Neurological: Negative.   Psychiatric/Behavioral: Negative.      Objective:  Physical Exam Constitutional:      Appearance: She is well-developed.  HENT:     Head: Normocephalic and atraumatic.  Cardiovascular:     Rate and Rhythm: Normal rate and regular rhythm.  Pulmonary:     Effort: Pulmonary effort is normal. No respiratory distress.     Breath sounds: Normal breath sounds. No wheezing or rales.  Abdominal:     General: Bowel sounds are normal. There is no distension.     Palpations: Abdomen is soft.     Tenderness: There is no abdominal tenderness. There is no rebound.  Musculoskeletal:     Cervical back: Normal range of motion.  Skin:    General: Skin is warm and dry.  Neurological:     Mental Status: She is alert and oriented to person, place, and time.     Coordination: Coordination normal.     Vitals:   07/15/22 1047  BP: 118/60  Pulse: 83  SpO2: 95%  Weight: 110 lb (49.9 kg)  Height: 5' 3" (1.6 m)    Assessment & Plan:

## 2022-07-15 NOTE — Patient Instructions (Addendum)
We will have you stop the diltiazem.

## 2022-07-16 ENCOUNTER — Ambulatory Visit
Admission: RE | Admit: 2022-07-16 | Discharge: 2022-07-16 | Disposition: A | Discharge status: Home / Self Care | Payer: Medicare PPO | Source: Ambulatory Visit | Attending: Physician Assistant | Admitting: Physician Assistant

## 2022-07-16 VITALS — BP 137/73 | HR 77 | Temp 97.6°F | Resp 16

## 2022-07-16 DIAGNOSIS — Z1152 Encounter for screening for COVID-19: Secondary | ICD-10-CM | POA: Insufficient documentation

## 2022-07-16 DIAGNOSIS — J029 Acute pharyngitis, unspecified: Secondary | ICD-10-CM | POA: Insufficient documentation

## 2022-07-16 DIAGNOSIS — M545 Low back pain, unspecified: Secondary | ICD-10-CM

## 2022-07-16 LAB — POCT URINALYSIS DIP (MANUAL ENTRY)
Bilirubin, UA: NEGATIVE
Blood, UA: NEGATIVE
Glucose, UA: NEGATIVE mg/dL
Ketones, POC UA: NEGATIVE mg/dL
Leukocytes, UA: NEGATIVE
Nitrite, UA: NEGATIVE
Protein Ur, POC: NEGATIVE mg/dL
Spec Grav, UA: 1.015 (ref 1.010–1.025)
Urobilinogen, UA: 0.2 E.U./dL
pH, UA: 6.5 (ref 5.0–8.0)

## 2022-07-16 LAB — POCT RAPID STREP A (OFFICE): Rapid Strep A Screen: NEGATIVE

## 2022-07-16 NOTE — ED Provider Notes (Signed)
[facility]    CSN: [id number] Arrival date & time: 07/16/22  1449      History   Chief Complaint Chief Complaint  Patient presents with   Sore Throat    HPI Paula Ward is a 86 y.o. female.   Patient here today for evaluation of sore throat. She denies any nasal congestion or cough. She has not had fever. She has tried throat spray with minimal relief. She questions if sore throat may be related to acid reflux. She reports she has also had some low back pain.   The history is provided by the patient.  Sore Throat Pertinent negatives include no abdominal pain and no shortness of breath.    Past Medical History:  Diagnosis Date   Anemia    Angiodysplasia of stomach and duodenum    Arthus phenomenon    AVM (arteriovenous malformation)    Blood transfusion without reported diagnosis    last 4 units 12-22-15, Iron infusion x2 last -01-07-16,01-14-16.   Breast cancer ([place]) 1989   Left   Candida esophagitis (HCC)    Cataract    Chronic kidney disease    Chronic mild renal insuffiency   CREST syndrome (HCC)    GERD (gastroesophageal reflux disease)    w/ HH   Interstitial lung disease (HCC)    Nodule of kidney    Pericardial effusion    PONV (postoperative nausea and vomiting)    Pulmonary embolus ([place]) 2003   Pulmonary hypertension ([place])    followed by Dr Gaynell Face at [facility], now Dr. Maryjean Ka visit end 2'17.   Rectal incontinence    Renal cell carcinoma (HCC)    Scleroderma (HCC)    Tubular adenoma of colon    Uterine polyp     Patient Active Problem List   Diagnosis Date Noted   Moderate aortic stenosis 04/26/2022   Protein-calorie malnutrition, severe 03/20/2022   AKI (acute kidney injury) ([place])    CAP (community acquired pneumonia) 03/18/2022   Pressure injury of skin 03/18/2022   Chest pain, atypical 03/17/2022   Aortic stenosis, moderate 10/29/2021   Generalized weakness 03/29/2021   Acute on chronic diastolic CHF (congestive heart  failure) ([place]) 03/29/2021   Elevated troponin 03/29/2021   AVM (arteriovenous malformation) of small bowel, acquired    SOB (shortness of breath) on exertion 03/08/2021   Anemia, chronic disease 12/25/2020   Fatigue 12/25/2020   Hoarseness 07/24/2020   Globus pharyngeus 08/19/2019   Drowsiness 12/17/2018   Leg swelling 09/03/2018   Orthopnea 06/24/2018   Dry mouth 02/15/2018   Allergic rhinitis 11/05/2017   Cerebellar 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 ([place]) 10/26/2017   Routine general medical examination at a health care facility 01/30/2017   Community acquired pneumonia 08/19/2016   Pericardial effusion 01/21/2016   Melena    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[place])    Osteoarthritis 02/25/2015   Mass of left side of neck    History of pulmonary embolism 01/29/2013   CKD (chronic kidney disease) stage 3, GFR 30-59 ml/min (HCC) 09/01/2012   RECTAL INCONTINENCE 03/27/2009   Angiodysplasia of stomach and duodenum 08/22/2008   ADENOCARCINOMA, BREAST, LEFT 07/26/2007   Anemia, iron deficiency 07/26/2007   Essential hypertension 07/26/2007   Pulmonary hypertension ([place]) 07/26/2007   GASTROESOPHAGEAL REFLUX DISEASE 07/26/2007   SCLERODERMA 07/26/2007   ILD (interstitial lung disease) ([place]) 07/26/2007   PULMONARY EMBOLISM, HX OF 07/26/2007    Past Surgical History:  Procedure Laterality Date   APPENDECTOMY  1962  BREAST LUMPECTOMY  1989   left   CATARACT EXTRACTION, BILATERAL Bilateral 12/2013   COLONOSCOPY WITH PROPOFOL N/A 04/15/2021   Procedure: COLONOSCOPY WITH PROPOFOL;  Surgeon: Milus Banister, MD;  Location: [facility];  Service: Endoscopy;  Laterality: N/A;   ENTEROSCOPY N/A 01/18/2016   Procedure: ENTEROSCOPY;  Surgeon: Mauri Pole, MD;  Location: [facility];  Service: Endoscopy;  Laterality: N/A;   ENTEROSCOPY N/A 05/24/2018   Procedure: ENTEROSCOPY;  Surgeon: Jackquline Denmark, MD;  Location: [facility];  Service: Endoscopy;  Laterality: N/A;    ENTEROSCOPY N/A 03/29/2021   Procedure: ENTEROSCOPY;  Surgeon: Jerene Bears, MD;  Location: [facility];  Service: Gastroenterology;  Laterality: N/A;   ENTEROSCOPY N/A 04/13/2021   Procedure: ENTEROSCOPY;  Surgeon: Yetta Flock, MD;  Location: [facility];  Service: Gastroenterology;  Laterality: N/A;   ESOPHAGOGASTRODUODENOSCOPY (EGD) WITH PROPOFOL N/A 12/21/2015   Procedure: ESOPHAGOGASTRODUODENOSCOPY (EGD) WITH PROPOFOL;  Surgeon: Irene Shipper, MD;  Location: [facility];  Service: Endoscopy;  Laterality: N/A;   HOT HEMOSTASIS N/A 05/24/2018   Procedure: HOT HEMOSTASIS (ARGON PLASMA COAGULATION/BICAP);  Surgeon: Jackquline Denmark, MD;  Location: [facility];  Service: Endoscopy;  Laterality: N/A;   HOT HEMOSTASIS N/A 03/29/2021   Procedure: HOT HEMOSTASIS (ARGON PLASMA COAGULATION/BICAP);  Surgeon: Jerene Bears, MD;  Location: [facility];  Service: Gastroenterology;  Laterality: N/A;   HOT HEMOSTASIS N/A 04/13/2021   Procedure: HOT HEMOSTASIS (ARGON PLASMA COAGULATION/BICAP);  Surgeon: Yetta Flock, MD;  Location: [facility];  Service: Gastroenterology;  Laterality: N/A;   IR GENERIC HISTORICAL  06/05/2016   IR RADIOLOGIST EVAL & MGMT 06/05/2016 Aletta Edouard, MD [facility] INTERV RAD   IVC Filter     KIDNEY SURGERY     left -"laser surgery by Dr. Kathlene Cote- 5 yrs ago" no removal   RIGHT/LEFT HEART CATH AND CORONARY ANGIOGRAPHY N/A 04/25/2022   Procedure: RIGHT/LEFT HEART CATH AND CORONARY ANGIOGRAPHY;  Surgeon: Sherren Mocha, MD;  Location: [facility];  Service: Cardiovascular;  Laterality: N/A;   SCHLEROTHERAPY  05/24/2018   Procedure: Woodward Ku;  Surgeon: Jackquline Denmark, MD;  Location: [facility];  Service: Endoscopy;;   SUBMUCOSAL TATTOO INJECTION  04/13/2021   Procedure: SUBMUCOSAL TATTOO INJECTION;  Surgeon: Yetta Flock, MD;  Location: [facility];  Service: Gastroenterology;;   TONSILLECTOMY     TUBAL LIGATION      OB History   No obstetric history  on file.      Home Medications    Prior to Admission medications   Medication Sig Start Date End Date Taking? Authorizing Provider  acetaminophen (TYLENOL) 325 MG tablet Take 650 mg by mouth every 6 (six) hours as needed for mild pain.     [provider]  albuterol (VENTOLIN HFA) 108 (90 Base) MCG/ACT inhaler INHALE 2 PUFFS INTO THE LUNGS EVERY 6 HOURS AS NEEDED FOR WHEEZING OR SHORTNESS OF BREATH 03/06/20   Hoyt Koch, MD  ALPRAZolam Duanne Moron) 0.25 MG tablet Take 0.5 tablets (0.125 mg total) by mouth at bedtime as needed. 10/29/20   Hoyt Koch, MD  chlorhexidine (PERIDEX) 0.12 % solution 15 mLs by Mouth Rinse route 2 (two) times daily as needed (sore mouth). 03/31/22   Hoyt Koch, MD  Cyanocobalamin (VITAMIN B-12 PO) Take 1 tablet by mouth 2 (two) times a week.    [provider]  diltiazem (CARDIZEM CD) 120 MG 24 hr capsule Take 120 mg by mouth in the morning.    [provider]  fluticasone Asencion Islam)  50 MCG/ACT nasal spray Place 1 spray into both nostrils daily as needed for allergies. 05/15/22   Hoyt Koch, MD  furosemide (LASIX) 20 MG tablet Take 1 tablet by mouth once daily 07/03/22   Hoyt Koch, MD  guaiFENesin (MUCINEX) 600 MG 12 hr tablet Take 150 mg by mouth daily.    [provider]  ipratropium (ATROVENT) 0.03 % nasal spray Place 2 sprays into both nostrils every 12 (twelve) hours. 05/12/22   Freddi Starr, MD  magic mouthwash SOLN Take 10 mLs by mouth 4 (four) times daily as needed for mouth pain. 03/22/22   Shelly Coss, MD  montelukast (SINGULAIR) 10 MG tablet TAKE 1 TABLET BY MOUTH AT BEDTIME 07/16/22   Hoyt Koch, MD  Multiple Vitamins-Iron (MULTIVITAMIN/IRON PO) Take 1 tablet by mouth daily.    [provider]  Multiple Vitamins-Minerals (ZINC PO) Take 1 tablet by mouth daily.    [provider]  OPSUMIT 10 MG TABS Take 10 mg by mouth daily. 08/16/15    [provider]  OXYGEN Inhale into the lungs. 2 LMP at night and upon exertion    [provider]  pantoprazole (PROTONIX) 40 MG tablet Take 1 tablet (40 mg total) by mouth 2 (two) times daily before a meal. 02/27/22   Nandigam, Venia Minks, MD  Pyridoxine HCl (VITAMIN B6 PO) Take 1 tablet by mouth as needed. Unsure of dose    [provider]  spironolactone (ALDACTONE) 25 MG tablet Take 12.5 mg by mouth daily. 06/09/20   [provider]  sucralfate (CARAFATE) 1 GM/10ML suspension Take 1 g by mouth every 6 (six) hours as needed (reflux). 03/01/22   [provider]  triamcinolone (KENALOG) 0.1 % paste Use as directed 1 Application in the mouth or throat 3 (three) times daily as needed (sore mouth). 04/05/22   [provider]    Family History Family History  Problem Relation Age of Onset   Bladder Cancer Father    Diabetes Father    Prostate cancer Father    Alzheimer's disease Mother    Diabetes Sister    Lung cancer Sister         smoker   Esophageal cancer Paternal Uncle    Colon cancer Neg Hx     Social History Social History   Tobacco Use   Smoking status: Never   Smokeless tobacco: Never  Vaping Use   Vaping Use: Never used  Substance Use Topics   Alcohol use: No   Drug use: No     Allergies   Codeine, Other, Erythromycin, Iodinated contrast media, Lisinopril, Mycophenolate mofetil, and Sulfa antibiotics   Review of Systems Review of Systems  Constitutional:  Negative for chills and fever.  HENT:  Positive for sore throat. Negative for congestion and ear pain.   Eyes:  Negative for discharge and redness.  Respiratory:  Negative for cough, shortness of breath and wheezing.   Gastrointestinal:  Negative for abdominal pain, diarrhea, nausea and vomiting.  Musculoskeletal:  Positive for back pain.     Physical Exam Triage Vital Signs ED Triage Vitals  Enc Vitals Group     BP 07/16/22 1505 (!) 153/74     Pulse  Rate 07/16/22 1505 77     Resp 07/16/22 1505 16     Temp 07/16/22 1505 97.6 F (36.4 C)     Temp Source 07/16/22 1505 Oral     SpO2 07/16/22 1505 95 %     Weight --  Height --      Head Circumference --      Peak Flow --      Pain Score 07/16/22 1503 0     Pain Loc --      Pain Edu? --      Excl. in [place]? --    No data found.  Updated Vital Signs BP 137/73 (BP Location: Right Arm)   Pulse 77   Temp 97.6 F (36.4 C) (Oral)   Resp 16   SpO2 95%  \   Physical Exam Vitals and nursing note reviewed.  Constitutional:      General: She is not in acute distress.    Appearance: Normal appearance. She is not ill-appearing.  HENT:     Head: Normocephalic and atraumatic.     Nose: No congestion or rhinorrhea.     Mouth/Throat:     Mouth: Mucous membranes are moist.     Pharynx: Posterior oropharyngeal erythema present. No oropharyngeal exudate.  Eyes:     Conjunctiva/sclera: Conjunctivae normal.  Cardiovascular:     Rate and Rhythm: Normal rate and regular rhythm.     Heart sounds: Normal heart sounds. No murmur heard. Pulmonary:     Effort: Pulmonary effort is normal. No respiratory distress.     Breath sounds: Normal breath sounds. No wheezing, rhonchi or rales.  Skin:    General: Skin is warm and dry.  Neurological:     Mental Status: She is alert.  Psychiatric:        Mood and Affect: Mood normal.        Thought Content: Thought content normal.      UC Treatments / Results  Labs (all labs ordered are listed, but only abnormal results are displayed) Labs Reviewed  SARS CORONAVIRUS 2 (TAT 6-24 HRS)  CULTURE, GROUP A STREP [facility])  POCT RAPID STREP A (OFFICE)  POCT URINALYSIS DIP (MANUAL ENTRY)    EKG   Radiology No results found.  Procedures Procedures (including critical care time)  Medications Ordered in UC Medications - No data to display  Initial Impression / Assessment and Plan / UC Course  I have reviewed the triage vital signs and the  nursing notes.  Pertinent labs & imaging results that were available during my care of the patient were reviewed by me and considered in my medical decision making (see chart for details).    Strep test negative. Will order covid screening as well as throat culture. UA ordered given low back pain- no concerning findings. Encouraged symptomatic treatment and follow up with any further concerns.   Final Clinical Impressions(s) / UC Diagnoses   Final diagnoses:  Acute pharyngitis, unspecified etiology  Encounter for screening for COVID-19  Acute low back pain, unspecified back pain laterality, unspecified whether sciatica present     Discharge Instructions       Try CEPACOL lozenges for sore throat relief.     ED Prescriptions   None    PDMP not reviewed this encounter.   Francene Finders, PA-C 07/16/22 1706

## 2022-07-16 NOTE — ED Triage Notes (Addendum)
Pt c/o sore throat, and lower back pain.  Started: yesterday  Home interventions: salt water gargles

## 2022-07-16 NOTE — Discharge Instructions (Signed)
  Try CEPACOL lozenges for sore throat relief.

## 2022-07-17 LAB — SARS CORONAVIRUS 2 (TAT 6-24 HRS): SARS Coronavirus 2: NEGATIVE

## 2022-07-18 ENCOUNTER — Ambulatory Visit (INDEPENDENT_AMBULATORY_CARE_PROVIDER_SITE_OTHER)
Admission: EM | Admit: 2022-07-18 | Discharge: 2022-07-18 | Disposition: A | Discharge status: Home / Self Care | Payer: Medicare PPO | Source: Home / Self Care

## 2022-07-18 ENCOUNTER — Encounter (HOSPITAL_COMMUNITY)
Admission: RE | Admit: 2022-07-18 | Discharge: 2022-07-18 | Disposition: A | Discharge status: Home / Self Care | Payer: Medicare PPO | Source: Ambulatory Visit | Attending: Nephrology | Admitting: Nephrology

## 2022-07-18 ENCOUNTER — Ambulatory Visit (HOSPITAL_BASED_OUTPATIENT_CLINIC_OR_DEPARTMENT_OTHER)
Admission: RE | Admit: 2022-07-18 | Discharge: 2022-07-18 | Disposition: A | Discharge status: Home / Self Care | Payer: Medicare PPO | Source: Ambulatory Visit | Attending: Urgent Care | Admitting: Urgent Care

## 2022-07-18 ENCOUNTER — Other Ambulatory Visit: Payer: Self-pay | Admitting: Internal Medicine

## 2022-07-18 VITALS — BP 127/53 | HR 88 | Temp 97.9°F | Resp 18

## 2022-07-18 DIAGNOSIS — Y9301 Activity, walking, marching and hiking: Secondary | ICD-10-CM | POA: Insufficient documentation

## 2022-07-18 DIAGNOSIS — M419 Scoliosis, unspecified: Secondary | ICD-10-CM | POA: Diagnosis not present

## 2022-07-18 DIAGNOSIS — M546 Pain in thoracic spine: Secondary | ICD-10-CM

## 2022-07-18 DIAGNOSIS — W19XXXA Unspecified fall, initial encounter: Secondary | ICD-10-CM | POA: Diagnosis not present

## 2022-07-18 DIAGNOSIS — N1832 Chronic kidney disease, stage 3b: Secondary | ICD-10-CM | POA: Diagnosis not present

## 2022-07-18 DIAGNOSIS — M25521 Pain in right elbow: Secondary | ICD-10-CM | POA: Diagnosis not present

## 2022-07-18 DIAGNOSIS — M25561 Pain in right knee: Secondary | ICD-10-CM | POA: Diagnosis not present

## 2022-07-18 DIAGNOSIS — D638 Anemia in other chronic diseases classified elsewhere: Secondary | ICD-10-CM | POA: Diagnosis not present

## 2022-07-18 DIAGNOSIS — S51011A Laceration without foreign body of right elbow, initial encounter: Secondary | ICD-10-CM

## 2022-07-18 DIAGNOSIS — R918 Other nonspecific abnormal finding of lung field: Secondary | ICD-10-CM | POA: Diagnosis not present

## 2022-07-18 DIAGNOSIS — S8001XA Contusion of right knee, initial encounter: Secondary | ICD-10-CM | POA: Insufficient documentation

## 2022-07-18 DIAGNOSIS — Y92538 Other ambulatory health services establishments as the place of occurrence of the external cause: Secondary | ICD-10-CM | POA: Insufficient documentation

## 2022-07-18 DIAGNOSIS — W1830XA Fall on same level, unspecified, initial encounter: Secondary | ICD-10-CM | POA: Insufficient documentation

## 2022-07-18 DIAGNOSIS — I517 Cardiomegaly: Secondary | ICD-10-CM | POA: Diagnosis not present

## 2022-07-18 LAB — POCT HEMOGLOBIN-HEMACUE: Hemoglobin: 11.4 g/dL — ABNORMAL LOW (ref 12.0–15.0)

## 2022-07-18 MED ORDER — ACETAMINOPHEN 325 MG PO TABS: 650.0000 mg | ORAL_TABLET | Freq: Four times a day (QID) | ORAL | 0 refills | Status: DC | PRN

## 2022-07-18 MED ORDER — EPOETIN ALFA-EPBX 10000 UNIT/ML IJ SOLN
10000.0000 [IU] | INTRAMUSCULAR | Status: DC
  Administered 2022-07-18: 10000 [IU] via SUBCUTANEOUS

## 2022-07-18 MED ORDER — EPOETIN ALFA-EPBX 10000 UNIT/ML IJ SOLN
INTRAMUSCULAR | Status: AC
  Filled 2022-07-18: qty 1

## 2022-07-18 NOTE — ED Provider Notes (Signed)
[facility]RG[facility]  Note:  This document was prepared using Systems analyst and may include unintentional dictation errors.  MRN: [id number] DOB: [birth year]  Subjective:   Paula Ward is a 86 y.o. female presenting for suffering a fall today just outside the [facility] health infusion center.  Patient fell making impact against the pavement with her right knee, right elbow.  She has since had bleeding from the laceration.  She also feels like she has swelling of her right knee.  And has significant thoracic back pain.  Did not hit her head, lose consciousness.  The fall was accidental.  No chest pain, shortness of breath.  Patient ambulates with a cane and states that she just got caught up while trying to step over a curb.  No current facility-administered medications for this encounter.  Current Outpatient Medications:    acetaminophen (TYLENOL) 325 MG tablet, Take 650 mg by mouth every 6 (six) hours as needed for mild pain. , Disp: , Rfl:    albuterol (VENTOLIN HFA) 108 (90 Base) MCG/ACT inhaler, INHALE 2 PUFFS INTO THE LUNGS EVERY 6 HOURS AS NEEDED FOR WHEEZING OR SHORTNESS OF BREATH, Disp: 6.7 g, Rfl: 3   ALPRAZolam (XANAX) 0.25 MG tablet, Take 0.5 tablets (0.125 mg total) by mouth at bedtime as needed., Disp: 30 tablet, Rfl: 3   chlorhexidine (PERIDEX) 0.12 % solution, 15 mLs by Mouth Rinse route 2 (two) times daily as needed (sore mouth)., Disp: 120 mL, Rfl: 11   Cyanocobalamin (VITAMIN B-12 PO), Take 1 tablet by mouth 2 (two) times a week., Disp: , Rfl:    diltiazem (CARDIZEM CD) 120 MG 24 hr capsule, Take 120 mg by mouth in the morning., Disp: , Rfl:    fluticasone (FLONASE) 50 MCG/ACT nasal spray, Place 1 spray into both nostrils daily as needed for allergies., Disp: 48 g, Rfl: 0   furosemide (LASIX) 20 MG tablet, Take 1 tablet by mouth once daily, Disp: 90 tablet, Rfl: 0   guaiFENesin (MUCINEX) 600 MG 12 hr tablet, Take 150 mg by mouth daily., Disp:  , Rfl:    ipratropium (ATROVENT) 0.03 % nasal spray, Place 2 sprays into both nostrils every 12 (twelve) hours., Disp: 30 mL, Rfl: 12   magic mouthwash SOLN, Take 10 mLs by mouth 4 (four) times daily as needed for mouth pain., Disp: 15 mL, Rfl: 2   montelukast (SINGULAIR) 10 MG tablet, TAKE 1 TABLET BY MOUTH AT BEDTIME, Disp: 90 tablet, Rfl: 0   Multiple Vitamins-Iron (MULTIVITAMIN/IRON PO), Take 1 tablet by mouth daily., Disp: , Rfl:    Multiple Vitamins-Minerals (ZINC PO), Take 1 tablet by mouth daily., Disp: , Rfl:    OPSUMIT 10 MG TABS, Take 10 mg by mouth daily., Disp: , Rfl:    OXYGEN, Inhale into the lungs. 2 LMP at night and upon exertion, Disp: , Rfl:    pantoprazole (PROTONIX) 40 MG tablet, Take 1 tablet (40 mg total) by mouth 2 (two) times daily before a meal., Disp: 60 tablet, Rfl: 5   Pyridoxine HCl (VITAMIN B6 PO), Take 1 tablet by mouth as needed. Unsure of dose, Disp: , Rfl:    spironolactone (ALDACTONE) 25 MG tablet, Take 12.5 mg by mouth daily., Disp: , Rfl:    sucralfate (CARAFATE) 1 GM/10ML suspension, Take 1 g by mouth every 6 (six) hours as needed (reflux)., Disp: , Rfl:    triamcinolone (KENALOG) 0.1 % paste, Use as directed 1 Application in the mouth or throat  3 (three) times daily as needed (sore mouth)., Disp: , Rfl:   Facility-Administered Medications Ordered in Other Encounters:    epoetin alfa-epbx (RETACRIT) 71696 UNIT/ML injection, , , ,    epoetin alfa-epbx (RETACRIT) injection 10,000 Units, 10,000 Units, Subcutaneous, Q28 days, Claudia Desanctis, MD, 10,000 Units at 07/18/22 1032   Allergies  Allergen Reactions   Codeine Nausea Only    Hallucinations, too   Other Nausea And Vomiting    "-mycin" antibiotics.   Also cause hallucinations.   Erythromycin Nausea And Vomiting   Iodinated Contrast Media Other (See Comments)    Renal issues   Lisinopril     Pt doesn't remember    Mycophenolate Mofetil Nausea Only   Sulfa Antibiotics Other (See Comments)    unknown     Past Medical History:  Diagnosis Date   Anemia    Angiodysplasia of stomach and duodenum    Arthus phenomenon    AVM (arteriovenous malformation)    Blood transfusion without reported diagnosis    last 4 units 12-22-15, Iron infusion x2 last -01-07-16,01-14-16.   Breast cancer ([place]) 1989   Left   Candida esophagitis (HCC)    Cataract    Chronic kidney disease    Chronic mild renal insuffiency   CREST syndrome (HCC)    GERD (gastroesophageal reflux disease)    w/ HH   Interstitial lung disease (HCC)    Nodule of kidney    Pericardial effusion    PONV (postoperative nausea and vomiting)    Pulmonary embolus ([place]) 2003   Pulmonary hypertension ([place])    followed by Dr Gaynell Face at [facility], now Dr. Maryjean Ka visit end 2'17.   Rectal incontinence    Renal cell carcinoma (HCC)    Scleroderma (HCC)    Tubular adenoma of colon    Uterine polyp      Past Surgical History:  Procedure Laterality Date   APPENDECTOMY  1962   BREAST LUMPECTOMY  1989   left   CATARACT EXTRACTION, BILATERAL Bilateral 12/2013   COLONOSCOPY WITH PROPOFOL N/A 04/15/2021   Procedure: COLONOSCOPY WITH PROPOFOL;  Surgeon: Milus Banister, MD;  Location: [facility];  Service: Endoscopy;  Laterality: N/A;   ENTEROSCOPY N/A 01/18/2016   Procedure: ENTEROSCOPY;  Surgeon: Mauri Pole, MD;  Location: [facility];  Service: Endoscopy;  Laterality: N/A;   ENTEROSCOPY N/A 05/24/2018   Procedure: ENTEROSCOPY;  Surgeon: Jackquline Denmark, MD;  Location: [facility];  Service: Endoscopy;  Laterality: N/A;   ENTEROSCOPY N/A 03/29/2021   Procedure: ENTEROSCOPY;  Surgeon: Jerene Bears, MD;  Location: [facility];  Service: Gastroenterology;  Laterality: N/A;   ENTEROSCOPY N/A 04/13/2021   Procedure: ENTEROSCOPY;  Surgeon: Yetta Flock, MD;  Location: [facility];  Service: Gastroenterology;  Laterality: N/A;   ESOPHAGOGASTRODUODENOSCOPY (EGD) WITH PROPOFOL N/A 12/21/2015   Procedure: ESOPHAGOGASTRODUODENOSCOPY  (EGD) WITH PROPOFOL;  Surgeon: Irene Shipper, MD;  Location: [facility];  Service: Endoscopy;  Laterality: N/A;   HOT HEMOSTASIS N/A 05/24/2018   Procedure: HOT HEMOSTASIS (ARGON PLASMA COAGULATION/BICAP);  Surgeon: Jackquline Denmark, MD;  Location: [facility];  Service: Endoscopy;  Laterality: N/A;   HOT HEMOSTASIS N/A 03/29/2021   Procedure: HOT HEMOSTASIS (ARGON PLASMA COAGULATION/BICAP);  Surgeon: Jerene Bears, MD;  Location: [facility];  Service: Gastroenterology;  Laterality: N/A;   HOT HEMOSTASIS N/A 04/13/2021   Procedure: HOT HEMOSTASIS (ARGON PLASMA COAGULATION/BICAP);  Surgeon: Yetta Flock, MD;  Location: [facility];  Service: Gastroenterology;  Laterality: N/A;   IR GENERIC  HISTORICAL  06/05/2016   IR RADIOLOGIST EVAL & MGMT 06/05/2016 Aletta Edouard, MD [facility] INTERV RAD   IVC Filter     KIDNEY SURGERY     left -"laser surgery by Dr. Kathlene Cote- 5 yrs ago" no removal   RIGHT/LEFT HEART CATH AND CORONARY ANGIOGRAPHY N/A 04/25/2022   Procedure: RIGHT/LEFT HEART CATH AND CORONARY ANGIOGRAPHY;  Surgeon: Sherren Mocha, MD;  Location: [facility];  Service: Cardiovascular;  Laterality: N/A;   SCHLEROTHERAPY  05/24/2018   Procedure: Woodward Ku;  Surgeon: Jackquline Denmark, MD;  Location: [facility];  Service: Endoscopy;;   SUBMUCOSAL TATTOO INJECTION  04/13/2021   Procedure: SUBMUCOSAL TATTOO INJECTION;  Surgeon: Yetta Flock, MD;  Location: [facility];  Service: Gastroenterology;;   TONSILLECTOMY     TUBAL LIGATION      Family History  Problem Relation Age of Onset   Bladder Cancer Father    Diabetes Father    Prostate cancer Father    Alzheimer's disease Mother    Diabetes Sister    Lung cancer Sister         smoker   Esophageal cancer Paternal Uncle    Colon cancer Neg Hx     Social History   Tobacco Use   Smoking status: Never   Smokeless tobacco: Never  Vaping Use   Vaping Use: Never used  Substance Use Topics   Alcohol use: No   Drug use:  No    ROS   Objective:   Vitals: BP (!) 165/68 (BP Location: Left Arm)   Pulse 84   Temp 98.2 F (36.8 C) (Oral)   Resp 18   SpO2 95%   Physical Exam Constitutional:      General: She is not in acute distress.    Appearance: Normal appearance. She is well-developed and normal weight. She is not ill-appearing, toxic-appearing or diaphoretic.  HENT:     Head: Normocephalic and atraumatic.     Right Ear: Tympanic membrane, ear canal and external ear normal. No drainage or tenderness. No middle ear effusion. There is no impacted cerumen. Tympanic membrane is not erythematous.     Left Ear: Tympanic membrane, ear canal and external ear normal. No drainage or tenderness.  No middle ear effusion. There is no impacted cerumen. Tympanic membrane is not erythematous.     Nose: Nose normal. No congestion or rhinorrhea.     Mouth/Throat:     Mouth: Mucous membranes are moist. No oral lesions.     Pharynx: No pharyngeal swelling, oropharyngeal exudate, posterior oropharyngeal erythema or uvula swelling.     Tonsils: No tonsillar exudate or tonsillar abscesses.  Eyes:     General: No scleral icterus.       Right eye: No discharge.        Left eye: No discharge.     Extraocular Movements: Extraocular movements intact.     Right eye: Normal extraocular motion.     Left eye: Normal extraocular motion.     Conjunctiva/sclera: Conjunctivae normal.  Cardiovascular:     Rate and Rhythm: Normal rate.  Pulmonary:     Effort: Pulmonary effort is normal.  Musculoskeletal:     Right upper arm: No swelling, edema, deformity, lacerations, tenderness or bony tenderness.     Right elbow: Swelling and laceration present. No deformity or effusion. Decreased range of motion. Tenderness (laterally) present. No radial head, medial epicondyle, lateral epicondyle or olecranon process tenderness.       Arms:     Cervical back: Normal  range of motion and neck supple. No swelling, edema, deformity, erythema,  signs of trauma, lacerations, rigidity, spasms, torticollis, tenderness, bony tenderness or crepitus. Pain with movement present. Normal range of motion.     Thoracic back: Spasms, tenderness (over area outlined) and bony tenderness present. No swelling, edema, deformity, signs of trauma or lacerations. Decreased range of motion. No scoliosis.     Lumbar back: No swelling, edema, deformity, signs of trauma, lacerations, spasms, tenderness or bony tenderness. Normal range of motion. Negative right straight leg raise test and negative left straight leg raise test. No scoliosis.       Back:     Right knee: Swelling, ecchymosis and bony tenderness present. No deformity, effusion, erythema, lacerations or crepitus. Normal range of motion. Tenderness present over the lateral joint line. No medial joint line or patellar tendon tenderness. Normal alignment and normal patellar mobility.       Legs:  Lymphadenopathy:     Cervical: No cervical adenopathy.  Skin:    General: Skin is warm and dry.  Neurological:     General: No focal deficit present.     Mental Status: She is alert and oriented to person, place, and time.     Cranial Nerves: No cranial nerve deficit.     Motor: No weakness.     Coordination: Coordination normal.     Gait: Gait normal.  Psychiatric:        Mood and Affect: Mood normal.        Behavior: Behavior normal.        Thought Content: Thought content normal.        Judgment: Judgment normal.    PROCEDURE NOTE: laceration repair Verbal consent obtained from patient.  Local anesthesia with 10cc Lidocaine 2% with epinephrine.  Wound explored for tendon, ligament damage. Wound scrubbed with soap and water and rinsed. Wound closed with #4 4-0 Prolene (simple interrupted) sutures.  Wound cleansed and dressed.   Results for orders placed or performed during the hospital encounter of 07/18/22 (from the past 24 hour(s))  Hemoglobin-hemacue, POC     Status: Abnormal   Collection Time:  07/18/22 10:31 AM  Result Value Ref Range   Hemoglobin 11.4 (L) 12.0 - 15.0 g/dL   *Note: Due to a large number of results and/or encounters for the requested time period, some results have not been displayed. A complete set of results can be found in Results Review.   DG Thoracic Spine 2 View  Result Date: 07/18/2022 CLINICAL DATA:  Right thoracic pain after fall EXAM: THORACIC SPINE 2 VIEWS COMPARISON:  None Available. FINDINGS: Frontal and lateral views of the thoracic spine are obtained. Stable left convex thoracic scoliosis. Otherwise alignment is anatomic. No acute fractures. Paraspinal soft tissues are unremarkable. Stable marked enlargement the cardiac silhouette. IMPRESSION: 1. Stable left convex thoracic scoliosis and mild spondylosis. No acute bony abnormality. Electronically Signed   By: Randa Ngo M.D.   On: 07/18/2022 16:14   DG Ribs Unilateral W/Chest Right  Result Date: 07/18/2022 CLINICAL DATA:  Right-sided thoracic back pain after fall EXAM: RIGHT RIBS AND CHEST - 3+ VIEW COMPARISON:  03/17/2022 FINDINGS: Frontal view of the chest as well as frontal and oblique views of the right thoracic cage are obtained. Marked enlargement the cardiac silhouette again noted unchanged. Chronic left basilar opacity may reflect combination of atelectasis and/or effusion. No pneumothorax. There are no acute displaced fractures. Stable mild left convex thoracic scoliosis. IMPRESSION: 1. No acute displaced fracture. 2. Stable enlarged  cardiac silhouette. 3. Chronic left basilar consolidation and/or effusion. Electronically Signed   By: Randa Ngo M.D.   On: 07/18/2022 16:13   DG Elbow Complete Right  Result Date: 07/18/2022 CLINICAL DATA:  Fall.  Right elbow pain. EXAM: RIGHT ELBOW - COMPLETE 3+ VIEW COMPARISON:  None Available. FINDINGS: There is no evidence of fracture, dislocation, or joint effusion. Irregularity along the proximal forearm soft tissues and there is probably a bandage in this  area. IMPRESSION: No acute bone abnormality. Electronically Signed   By: Markus Daft M.D.   On: 07/18/2022 16:10   DG Knee Complete 4 Views Right  Result Date: 07/18/2022 CLINICAL DATA:  Right knee pain after fall EXAM: RIGHT KNEE - COMPLETE 4+ VIEW COMPARISON:  06/10/2022 FINDINGS: Frontal, bilateral oblique, lateral views of the right knee are obtained on 4 images. No acute fracture, subluxation, or dislocation. Stable mild 3 compartmental osteoarthritis and chondrocalcinosis. No joint effusion. Soft tissues are unremarkable. IMPRESSION: 1. Stable right knee osteoarthritis.  No acute fracture. Electronically Signed   By: Randa Ngo M.D.   On: 07/18/2022 16:10    Assessment and Plan :   PDMP not reviewed this encounter.  1. Right elbow pain   2. Acute pain of right knee   3. Acute right-sided thoracic back pain   4. Contusion of right knee, initial encounter   5. Elbow laceration, right, initial encounter   6. Accidental fall, initial encounter     Imaging was completely negative.  Successful laceration repair.  Low suspicion for an acute encephalopathy, no acute intracranial injury.  Wound care reviewed.  Patient is to take Tylenol for pain relief.  Follow up in 10 days for suture removal.  Counseled patient on potential for adverse effects with medications prescribed/recommended today, ER and return-to-clinic precautions discussed, patient verbalized understanding.    Jaynee Eagles, [place] 07/18/22 [id number]

## 2022-07-18 NOTE — ED Triage Notes (Signed)
Pt states that she tripped on the bricks while leaving [place] infusion center. Pt states she has a wound to her right elbow, mid back pain and right knee pain with discoloration.

## 2022-07-18 NOTE — Assessment & Plan Note (Signed)
Advised to hold diltiazem 120 mg daily until further notice. Continue lasix 20 mg daily and spironolactone 12.5 mg daily for now. BP acceptable today off diltiazem for several days. Monitor BP and if >140/90 call us for adjustment or if persistent low BP/dizziness call back.

## 2022-07-18 NOTE — Discharge Instructions (Addendum)
WOUND CARE Please return in 10 days to have your stitches/staples removed or sooner if you have concerns.  Keep area clean and dry for 24 hours. Do not remove bandage, if applied.  After 24 hours, remove bandage and wash wound gently with mild soap and warm water. Reapply a new bandage after cleaning wound, if directed.  Continue daily cleansing with soap and water until stitches/staples are removed.  Do not apply any ointments or creams to the wound while stitches/staples are in place, as this may cause delayed healing.  Notify the office if you experience any of the following signs of infection: Swelling, redness, pus drainage, streaking, fever >101.0 F  Notify the office if you experience excessive bleeding that does not stop after 15-20 minutes of constant, firm pressure.

## 2022-07-19 LAB — CULTURE, GROUP A STREP (THRC)

## 2022-07-21 DIAGNOSIS — I129 Hypertensive chronic kidney disease with stage 1 through stage 4 chronic kidney disease, or unspecified chronic kidney disease: Secondary | ICD-10-CM | POA: Diagnosis not present

## 2022-07-21 DIAGNOSIS — D631 Anemia in chronic kidney disease: Secondary | ICD-10-CM | POA: Diagnosis not present

## 2022-07-21 DIAGNOSIS — M349 Systemic sclerosis, unspecified: Secondary | ICD-10-CM | POA: Diagnosis not present

## 2022-07-21 DIAGNOSIS — N1831 Chronic kidney disease, stage 3a: Secondary | ICD-10-CM | POA: Diagnosis not present

## 2022-07-21 DIAGNOSIS — N1832 Chronic kidney disease, stage 3b: Secondary | ICD-10-CM | POA: Diagnosis not present

## 2022-07-21 DIAGNOSIS — I272 Pulmonary hypertension, unspecified: Secondary | ICD-10-CM | POA: Diagnosis not present

## 2022-07-21 DIAGNOSIS — Z85528 Personal history of other malignant neoplasm of kidney: Secondary | ICD-10-CM | POA: Diagnosis not present

## 2022-07-21 DIAGNOSIS — R809 Proteinuria, unspecified: Secondary | ICD-10-CM | POA: Diagnosis not present

## 2022-07-22 ENCOUNTER — Encounter: Payer: Self-pay | Admitting: Gastroenterology

## 2022-07-22 ENCOUNTER — Ambulatory Visit: Payer: Medicare PPO | Admitting: Gastroenterology

## 2022-07-22 VITALS — BP 110/60 | Temp 78.0°F | Ht 63.0 in | Wt 105.0 lb

## 2022-07-22 DIAGNOSIS — Q2730 Arteriovenous malformation, site unspecified: Secondary | ICD-10-CM

## 2022-07-22 DIAGNOSIS — E440 Moderate protein-calorie malnutrition: Secondary | ICD-10-CM | POA: Diagnosis not present

## 2022-07-22 DIAGNOSIS — K219 Gastro-esophageal reflux disease without esophagitis: Secondary | ICD-10-CM

## 2022-07-22 DIAGNOSIS — D509 Iron deficiency anemia, unspecified: Secondary | ICD-10-CM | POA: Diagnosis not present

## 2022-07-22 DIAGNOSIS — N183 Chronic kidney disease, stage 3 unspecified: Secondary | ICD-10-CM | POA: Diagnosis not present

## 2022-07-22 MED ORDER — SUCRALFATE 1 GM/10ML PO SUSP: 1.0000 g | Freq: Four times a day (QID) | ORAL | 1 refills | Status: DC | PRN

## 2022-07-22 MED ORDER — PANTOPRAZOLE SODIUM 40 MG PO TBEC: 40.0000 mg | DELAYED_RELEASE_TABLET | Freq: Two times a day (BID) | ORAL | 5 refills | Status: DC

## 2022-07-22 NOTE — Patient Instructions (Addendum)
If you are age 84 or older, your body mass index should be between 23-30. Your Body mass index is 18.6 kg/m. If this is out of the aforementioned range listed, please consider follow up with your Primary Care Provider.  If you are age 39 or younger, your body mass index should be between 19-25. Your Body mass index is 18.6 kg/m. If this is out of the aformentioned range listed, please consider follow up with your Primary Care Provider.   ________________________________________________________   We have sent the following medications to your pharmacy for you to pick up at your convenience: Pantoprazole 40 mg: Take twice daily Carafate: Drink 10 ml every 6 hours as needed  Drink an Ensure once to twice a day.  Please follow up in 4 months (Feb 2024)  I appreciate the opportunity to care for you. Thank you for choosing me and [facility], Dr. Harl Bowie

## 2022-07-22 NOTE — Progress Notes (Signed)
Paula Ward    [id number]    11-04-1935  Primary Care Physician:Crawford, Real Cons, MD  Referring Physician: Hoyt Koch, MD [place],  [place] [address]   Chief complaint:  Iron deficincy anemia  HPI:  86 year old very pleasant female with history of scleroderma, pulmonary hypertension, large pericardial effusion, small bowel AVM with chronic anemia here for follow-up visit   She is feeling better overall, hemoglobin improved to 12 with EPO injection IV iron infusion.   Denies any melena or rectal bleeding.    Weight has stabilized.  She is no longer losing weight but has not been able to gain much weight.  Her appetite is normal.   She is using MiraLAX every other day and has regular bowel movements.  She has intermittent episodes of fecal incontinence when she walks or she finds stool seepage in her depends   She was hospitalized in June 2022 with severe anemia and melena.She has been off anticoagulation due to history of recurrent GI bleed  Small bowel enteroscopy on June 10 and April 13, 2021: Multiple AVMs in the stomach and small bowel treated with APC   Colonoscopy April 15, 2021: Left colon diverticulosis otherwise unremarkable exam   She continues to have shortness of breath and cough. She is followed by [facility] for pulmonary hypertension and ILD.  Her oxygen saturation was 93% on 6 minute walk test, has home O2. She was previously on Bactrim, CellCept and prednisone.  She is currently on macitentan and diltiazem. She has large pericardial effusion without tamponade physiology, is monitored by cardiology at [facility]     CT abdomen and pelvis 04/04/2019: Large amount of stool through out the colon, colon diverticulosis otherwise no acute pathology Large pericardial effusion, limited imaging.   CT chest UIP pattern fibrosis   Colonoscopy July 12, 2014 by Dr. Olevia Perches: Sessile polyp tubular adenoma removed from  ascending colon.  Diverticulosis   Outpatient Encounter Medications as of 07/22/2022  Medication Sig   acetaminophen (TYLENOL) 325 MG tablet Take 2 tablets (650 mg total) by mouth every 6 (six) hours as needed for moderate pain.   albuterol (VENTOLIN HFA) 108 (90 Base) MCG/ACT inhaler INHALE 2 PUFFS INTO THE LUNGS EVERY 6 HOURS AS NEEDED FOR WHEEZING OR SHORTNESS OF BREATH   ALPRAZolam (XANAX) 0.25 MG tablet Take 0.5 tablets (0.125 mg total) by mouth at bedtime as needed.   chlorhexidine (PERIDEX) 0.12 % solution 15 mLs by Mouth Rinse route 2 (two) times daily as needed (sore mouth).   Cyanocobalamin (VITAMIN B-12 PO) Take 1 tablet by mouth 2 (two) times a week.   diltiazem (CARDIZEM CD) 120 MG 24 hr capsule Take 120 mg by mouth in the morning.   fluticasone (FLONASE) 50 MCG/ACT nasal spray Place 1 spray into both nostrils daily as needed for allergies.   furosemide (LASIX) 20 MG tablet Take 1 tablet by mouth once daily   guaiFENesin (MUCINEX) 600 MG 12 hr tablet Take 150 mg by mouth daily.   ipratropium (ATROVENT) 0.03 % nasal spray Place 2 sprays into both nostrils every 12 (twelve) hours.   magic mouthwash SOLN Take 10 mLs by mouth 4 (four) times daily as needed for mouth pain.   montelukast (SINGULAIR) 10 MG tablet TAKE 1 TABLET BY MOUTH AT BEDTIME   Multiple Vitamins-Iron (MULTIVITAMIN/IRON PO) Take 1 tablet by mouth daily.   Multiple Vitamins-Minerals (ZINC PO) Take 1 tablet by mouth daily.  OPSUMIT 10 MG TABS Take 10 mg by mouth daily.   OXYGEN Inhale into the lungs. 2 LMP at night and upon exertion   pantoprazole (PROTONIX) 40 MG tablet Take 1 tablet (40 mg total) by mouth 2 (two) times daily before a meal.   Pyridoxine HCl (VITAMIN B6 PO) Take 1 tablet by mouth as needed. Unsure of dose   spironolactone (ALDACTONE) 25 MG tablet Take 12.5 mg by mouth daily.   sucralfate (CARAFATE) 1 GM/10ML suspension Take 1 g by mouth every 6 (six) hours as needed (reflux).   [DISCONTINUED]  triamcinolone (KENALOG) 0.1 % paste Use as directed 1 Application in the mouth or throat 3 (three) times daily as needed (sore mouth).   No facility-administered encounter medications on file as of 07/22/2022.    Allergies as of 07/22/2022 - Review Complete 07/22/2022  Allergen Reaction Noted   Codeine Nausea Only 08/03/2008   Other Nausea And Vomiting 04/05/2012   Erythromycin Nausea And Vomiting 10/17/2014   Iodinated contrast media Other (See Comments) 04/18/2022   Lisinopril  12/14/2014   Mycophenolate mofetil Nausea Only 09/23/2019   Sulfa antibiotics Other (See Comments) 03/28/2021    Past Medical History:  Diagnosis Date   Anemia    Angiodysplasia of stomach and duodenum    Arthus phenomenon    AVM (arteriovenous malformation)    Blood transfusion without reported diagnosis    last 4 units 12-22-15, Iron infusion x2 last -01-07-16,01-14-16.   Breast cancer ([place]) 1989   Left   Candida esophagitis (HCC)    Cataract    Chronic kidney disease    Chronic mild renal insuffiency   CREST syndrome (HCC)    GERD (gastroesophageal reflux disease)    w/ HH   Interstitial lung disease (HCC)    Nodule of kidney    Pericardial effusion    PONV (postoperative nausea and vomiting)    Pulmonary embolus ([place]) 2003   Pulmonary hypertension ([place])    follow[facility] Dr Gaynell Face at [facility], now Dr. Maryjean Ka visit end 2'17.   Rectal incontinence    Renal cell carcinoma (HCC)    Scleroderma (HCC)    Tubular adenoma of colon    Uterine polyp     Past Surgical History:  Procedure Laterality Date   APPENDECTOMY  1962   BREAST LUMPECTOMY  1989   left   CATARACT EXTRACTION, BILATERAL Bilateral 12/2013   COLONOSCOPY WITH PROPOFOL N/A 04/15/2021   Procedure: COLONOSCOPY WITH PROPOFOL;  Surgeon: Milus Banister, MD;  [facility]: [facility];  Service: Endoscopy;  Laterality: N/A;   ENTEROSCOPY N/A 01/18/2016   Procedure: ENTEROSCOPY;  Surgeon: Mauri Pole, MD;  [facility];   Service: Endoscopy;  Laterality: N/A;   ENTEROSCOPY N/A 05/24/2018   Procedure: ENTEROSCOPY;  Surgeon: Jackquline Denmark, MD;  [facility];  Service: Endoscopy;  Laterality: N/A;   ENTEROSCOPY N/A 03/29/2021   Procedure: ENTEROSCOPY;  Surgeon: Jerene Bears, MD;  [facility];  Service: Gastroenterology;  Laterality: N/A;   ENTEROSCOPY N/A 04/13/2021   Procedure: ENTEROSCOPY;  Surgeon: Yetta Flock, MD;  [facility];  Service: Gastroenterology;  Laterality: N/A;   ESOPHAGOGASTRODUODENOSCOPY (EGD) WITH PROPOFOL N/A 12/21/2015   Procedure: ESOPHAGOGASTRODUODENOSCOPY (EGD) WITH PROPOFOL;  Surgeon: Irene Shipper, MD;  [facility];  Service: Endoscopy;  Laterality: N/A;   HOT HEMOSTASIS N/A 05/24/2018   Procedure: HOT HEMOSTASIS (ARGON PLASMA COAGULATION/BICAP);  Surgeon: Jackquline Denmark, MD;  [facility];  Service: Endoscopy;  Laterality: N/A;  HOT HEMOSTASIS N/A 03/29/2021   Procedure: HOT HEMOSTASIS (ARGON PLASMA COAGULATION/BICAP);  Surgeon: Jerene Bears, MD;  [facility];  Service: Gastroenterology;  Laterality: N/A;   HOT HEMOSTASIS N/A 04/13/2021   Procedure: HOT HEMOSTASIS (ARGON PLASMA COAGULATION/BICAP);  Surgeon: Yetta Flock, MD;  [facility];  Service: Gastroenterology;  Laterality: N/A;   IR GENERIC HISTORICAL  06/05/2016   IR RADIOLOGIST EVAL & MGMT 06/05/2016 Aletta Edouard, MD [facility] INTER[facility] RAD   IVC Filter     KIDNEY SURGERY     left -"laser surgery by Dr. Kathlene Cote- 5 yrs ago" no removal   RIGHT/LEFT HEART CATH AND CORONARY ANGIOGRAPHY N/A 04/25/2022   Procedure: RIGHT/LEFT HEART CATH AND CORONARY ANGIOGRAPHY;  Surgeon: Sherren Mocha, MD;  Location: [facility];  Service: Cardiovascular;  Laterality: N/A;   SCHLEROTHERAPY  05/24/2018   Procedure: Woodward Ku;  Surgeon: Jackquline Denmark, MD;  [facility];  Service: Endoscopy;;   SUBMUCOSAL TATTOO INJECTION  04/13/2021   Procedure: SUBMUCOSAL  TATTOO INJECTION;  Surgeon: Yetta Flock, MD;  [facility];  Service: Gastroenterology;;   TONSILLECTOMY     TUBAL LIGATION      Family History  Problem Relation Age of Onset   Bladder Cancer Father    Diabetes Father    Prostate cancer Father    Alzheimer's disease Mother    Diabetes Sister    Lung cancer Sister         smoker   Esophageal cancer Paternal Uncle    Colon cancer Neg Hx     Social History   Socioeconomic History   Marital status: Married    Spouse name: Not on file   Number of children: 2   Years of education: Not on file   Highest education level: Not on file  Occupational History   Occupation: Retired  Tobacco Use   Smoking status: Never   Smokeless tobacco: Never  Vaping Use   Vaping Use: Never used  Substance and Sexual Activity   Alcohol use: No   Drug use: No   Sexual activity: Not Currently  Other Topics Concern   Not on file  Social History Narrative   Married '611 son- '65, 1 daughter '63; 6 children (2 adopted)SO- SOBRetirement- doing well. Marriage in good health. Patient has never smoked. Alcohol use- noPt gets regular exercise   Social Determinants of Health   Financial Resource Strain: Low Risk  (04/02/2022)   Overall Financial Resource Strain (CARDIA)    Difficulty of Paying Living Expenses: Not hard at all  Food Insecurity: No Food Insecurity (04/02/2022)   Hunger Vital Sign    Worried About Running Out of Food in the Last Year: Never true    Ran Out of Food in the Last Year: Never true  Transportation Needs: No Transportation Needs (04/02/2022)   PRAPARE - Hydrologist (Medical): No    Lack of Transportation (Non-Medical): No  Physical Activity: Inactive (04/02/2022)   Exercise Vital Sign    Days of Exercise per Week: 0 days    Minutes of Exercise per Session: 0 min  Stress: No Stress Concern Present (04/02/2022)   [place]    Feeling of Stress : Not at all  Social Connections: [place] (04/02/2022)   Social Connection and Isolation Panel [NHANES]    Frequency of Communication with Friends and Family: More than three times a week    Frequency of Social  Gatherings with Friends and Family: More than three times a week    Attends Religious Services: More than 4 times per year    Active Member of Clubs or Organizations: Yes    Attends Music therapist: More than 4 times per year    Marital Status: Married  Human resources officer Violence: Not At Risk (04/02/2022)   Humiliation, Afraid, Rape, and Kick questionnaire    Fear of Current or Ex-Partner: No    Emotionally Abused: No    Physically Abused: No    Sexually Abused: No      Review of systems: All other review of systems negative except as mentioned in the HPI.   Physical Exam: Vitals:   07/22/22 1101  BP: 110/60  Temp: (Abnormal) 78 F (25.6 C)   Body mass index is 18.6 kg/m. Gen:      No acute distress HEENT:  sclera anicteric Abd:      soft, non-tender; no palpable masses, no distension Ext:    No edema Neuro: alert and oriented x 3 Psych: normal mood and affect  Data Reviewed:  Reviewed labs, radiology imaging, old records and pertinent past GI work up   Assessment and Plan/Recommendations:  86 year old female with history of scleroderma /systemic sclerosis, interstitial lung disease/chronic parenchymal disease/pulmonary fibrosis, large pericardial effusion and pulmonary hypertension with recurrent GI bleed secondary to gastrointestinal AVMs   She has chronic iron deficiency anemia due to chronic intermittent occult GI blood loss She is currently getting equal injections and IV iron infusions, is being monitored by hematology and nephrology   She has had multiple hospitalization due to recurrent GI bleed in the setting of AVMs. Insurance declined long acting octreotide to decrease transfusion  requirement and hospitalizations    Chronic kidney disease stage III, is followed by nephrology   Chronic idiopathic constipation and fecal incontinence due to weak anal sphincter.  Continue to use MiraLAX every other day, titrate dose based on response to have regular bowel movements.   Pulmonary fibrosis and pulmonary hypertension is followed by Pulmonary  Large pericardial effusion without cardiac tamponade: Followed by Cardiology  Weight loss and protein calorie malnutrition: Advised patient to eat small frequent meals and to drink 1-2 Ensure per day in between meals  GERD: Continue pantoprazole 40 mg twice daily before meals and use Carafate as needed for breakthrough symptoms Continue antireflux measures  Return in 3 to 4 months  The patient was provided an opportunity to ask questions and all were answered. The patient agreed with the plan and demonstrated an understanding of the instructions.  Damaris Hippo , MD    CC: Hoyt Koch, *

## 2022-07-24 ENCOUNTER — Telehealth: Payer: Self-pay | Admitting: Licensed Clinical Social Worker

## 2022-07-24 NOTE — Patient Outreach (Signed)
  Care Coordination   07/24/2022 Name: Paula Ward MRN: [id number] DOB: [birth year]   Care Coordination Outreach Attempts:  An unsuccessful telephone outreach was attempted today to offer the patient information about available care coordination services as a benefit of their health plan.   Follow Up Plan:  Additional outreach attempts will be made to offer the patient care coordination information and services.   Encounter Outcome:  No Answer  Care Coordination Interventions Activated:  No   Care Coordination Interventions:  No, not indicated    Casimer Lanius, [place] [phone]

## 2022-07-25 NOTE — Progress Notes (Signed)
HPI: FU pericardial effusion and pulmonary hypertension. 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 Has had IVC filter placed.  Patient has had a large pericardial effusion dating back to at least 2003.  She becomes more dyspneic with anemia. Patient has been followed at [facility] for her pulmonary hypertension.  She has previously not wanted to try Flolan.  She is presently on macitentan and diltiazem.  She apparently did not tolerate CellCept or azathioprine for her pulmonary fibrosis and has been maintained on prednisone.  Most recent echocardiogram May 2023 showed normal LV function, moderate left ventricular hypertrophy, grade 1 diastolic dysfunction, severe left atrial enlargement, large pericardial effusion, large pleural effusion, moderate aortic stenosis with mean gradient 25 mmHg.  Cardiac catheterization July 2023 showed no obstructive coronary disease, mild pulmonary hypertension, moderate aortic stenosis with mean gradient 16 mmHg and aortic valve area 1.2 cm.  Lower extremity venous Doppler September 2023 showed no DVT.  Note patient also with history of contrast nephropathy.  Since last seen, she denies increased dyspnea, chest pain, palpitations, syncope or bleeding.  She has fallen recently.  Current Outpatient Medications  Medication Sig Dispense Refill   acetaminophen (TYLENOL) 325 MG tablet Take 2 tablets (650 mg total) by mouth every 6 (six) hours as needed for moderate pain. 30 tablet 0   albuterol (VENTOLIN HFA) 108 (90 Base) MCG/ACT inhaler INHALE 2 PUFFS INTO THE LUNGS EVERY 6 HOURS AS NEEDED FOR WHEEZING OR SHORTNESS OF BREATH 6.7 g 3   ALPRAZolam (XANAX) 0.25 MG tablet Take 0.5 tablets (0.125 mg total) by mouth at bedtime as needed. 30 tablet 3   Cyanocobalamin (VITAMIN B-12 PO) Take 1 tablet by mouth 2 (two) times a week.     diltiazem (CARDIZEM CD) 120 MG 24 hr capsule Take 120 mg by mouth in the morning.      fluticasone (FLONASE) 50 MCG/ACT nasal spray Place 1 spray into both nostrils daily as needed for allergies. 48 g 0   furosemide (LASIX) 20 MG tablet Take 1 tablet by mouth once daily 90 tablet 0   guaiFENesin (MUCINEX) 600 MG 12 hr tablet Take 150 mg by mouth daily.     ipratropium (ATROVENT) 0.03 % nasal spray Place 2 sprays into both nostrils every 12 (twelve) hours. 30 mL 12   magic mouthwash SOLN Take 10 mLs by mouth 4 (four) times daily as needed for mouth pain. 15 mL 2   montelukast (SINGULAIR) 10 MG tablet TAKE 1 TABLET BY MOUTH AT BEDTIME 90 tablet 0   Multiple Vitamins-Iron (MULTIVITAMIN/IRON PO) Take 1 tablet by mouth daily.     Multiple Vitamins-Minerals (ZINC PO) Take 1 tablet by mouth daily.     OPSUMIT 10 MG TABS Take 10 mg by mouth daily.     OXYGEN Inhale into the lungs. 2 LMP at night and upon exertion     pantoprazole (PROTONIX) 40 MG tablet Take 1 tablet (40 mg total) by mouth 2 (two) times daily before a meal. 60 tablet 5   spironolactone (ALDACTONE) 25 MG tablet Take 12.5 mg by mouth daily.     sucralfate (CARAFATE) 1 GM/10ML suspension Take 10 mLs (1 g total) by mouth every 6 (six) hours as needed (reflux). 420 mL 1   chlorhexidine (PERIDEX) 0.12 % solution 15 mLs by Mouth Rinse route 2 (two) times daily as needed (sore mouth). (Patient not taking: Reported on 08/08/2022) 120 mL 11   predniSONE (DELTASONE) 10  MG tablet Take 3 tablets (30 mg total) by mouth daily with breakfast. (Patient not taking: Reported on 08/08/2022) 15 tablet 0   Pyridoxine HCl (VITAMIN B6 PO) Take 1 tablet by mouth as needed. Unsure of dose (Patient not taking: Reported on 08/08/2022)     No current facility-administered medications for this visit.     Past Medical History:  Diagnosis Date   Anemia    Angiodysplasia of stomach and duodenum    Arthus phenomenon    AVM (arteriovenous malformation)    Blood transfusion without reported diagnosis    last 4 units 12-22-15, Iron infusion x2 last  -01-07-16,01-14-16.   Breast cancer ([place]) 1989   Left   Candida esophagitis (HCC)    Cataract    Chronic kidney disease    Chronic mild renal insuffiency   CREST syndrome (HCC)    GERD (gastroesophageal reflux disease)    w/ HH   Interstitial lung disease (HCC)    Nodule of kidney    Pericardial effusion    PONV (postoperative nausea and vomiting)    Pulmonary embolus ([place]) 2003   Pulmonary hypertension ([place])    followed by Dr Gaynell Face at [facility], now Dr. Maryjean Ka visit end 2'17.   Rectal incontinence    Renal cell carcinoma (HCC)    Scleroderma (HCC)    Tubular adenoma of colon    Uterine polyp     Past Surgical History:  Procedure Laterality Date   APPENDECTOMY  1962   BREAST LUMPECTOMY  1989   left   CATARACT EXTRACTION, BILATERAL Bilateral 12/2013   COLONOSCOPY WITH PROPOFOL N/A 04/15/2021   Procedure: COLONOSCOPY WITH PROPOFOL;  Surgeon: Milus Banister, MD;  [facility];  Service: Endoscopy;  Laterality: N/A;   ENTEROSCOPY N/A 01/18/2016   Procedure: ENTEROSCOPY;  Surgeon: Mauri Pole, MD;  [facility];  Service: Endoscopy;  Laterality: N/A;   ENTEROSCOPY N/A 05/24/2018   Procedure: ENTEROSCOPY;  Surgeon: Jackquline Denmark, MD;  [facility];  Service: Endoscopy;  Laterality: N/A;   ENTEROSCOPY N/A 03/29/2021   Procedure: ENTEROSCOPY;  Surgeon: Jerene Bears, MD;  [facility];  Service: Gastroenterology;  Laterality: N/A;   ENTEROSCOPY N/A 04/13/2021   Procedure: ENTEROSCOPY;  Surgeon: Yetta Flock, MD;  [facility];  Service: Gastroenterology;  Laterality: N/A;   ESOPHAGOGASTRODUODENOSCOPY (EGD) WITH PROPOFOL N/A 12/21/2015   Procedure: ESOPHAGOGASTRODUODENOSCOPY (EGD) WITH PROPOFOL;  Surgeon: Irene Shipper, MD;  [facility];  Service: Endoscopy;  Laterality: N/A;   HOT HEMOSTASIS N/A 05/24/2018   Procedure: HOT HEMOSTASIS (ARGON PLASMA COAGULATION/BICAP);  Surgeon: Jackquline Denmark, MD;  [facility];  Service: Endoscopy;  Laterality: N/A;   HOT HEMOSTASIS N/A 03/29/2021   Procedure: HOT HEMOSTASIS (ARGON PLASMA COAGULATION/BICAP);  Surgeon: Jerene Bears, MD;  [facility];  Service: Gastroenterology;  Laterality: N/A;   HOT HEMOSTASIS N/A 04/13/2021   Procedure: HOT HEMOSTASIS (ARGON PLASMA COAGULATION/BICAP);  Surgeon: Yetta Flock, MD;  [facility];  Service: Gastroenterology;  Laterality: N/A;   IR GENERIC HISTORICAL  06/05/2016   IR RADIOLOGIST EVAL & MGMT 06/05/2016 Aletta Edouard, MD [facility] INTERV RAD   IVC Filter     KIDNEY SURGERY     left -"laser surgery by Dr. Kathlene Cote- 5 yrs ago" no removal   RIGHT/LEFT HEART CATH AND CORONARY ANGIOGRAPHY N/A 04/25/2022   Procedure: RIGHT/LEFT HEART CATH AND CORONARY ANGIOGRAPHY;  Surgeon: Sherren Mocha, MD;  Location: [facility];  Service: Cardiovascular;  Laterality:  N/A;   SCHLEROTHERAPY  05/24/2018   Procedure: Woodward Ku;  Surgeon: Jackquline Denmark, MD;  [facility];  Service: Endoscopy;;   SUBMUCOSAL TATTOO INJECTION  04/13/2021   Procedure: SUBMUCOSAL TATTOO INJECTION;  Surgeon: Yetta Flock, MD;  [facility];  Service: Gastroenterology;;   TONSILLECTOMY     TUBAL LIGATION      Social History   Socioeconomic History   Marital status: Married    Spouse name: Not on file   Number of children: 2   Years of education: Not on file   Highest education level: Not on file  Occupational History   Occupation: Retired  Tobacco Use   Smoking status: Never   Smokeless tobacco: Never  Vaping Use   Vaping Use: Never used  Substance and Sexual Activity   Alcohol use: No   Drug use: No   Sexual activity: Not Currently  Other Topics Concern   Not on file  Social History Narrative   Married '611 son- '65, 1 daughter '63; 6 children (2 adopted)SO- SOBRetirement- doing well. Marriage in good health. Patient has never smoked. Alcohol use- noPt gets regular exercise    Social Determinants of Health   Financial Resource Strain: Low Risk  (04/02/2022)   Overall Financial Resource Strain (CARDIA)    Difficulty of Paying Living Expenses: Not hard at all  Food Insecurity: No Food Insecurity (04/02/2022)   Hunger Vital Sign    Worried About Running Out of Food in the Last Year: Never true    Ran Out of Food in the Last Year: Never true  Transportation Needs: No Transportation Needs (04/02/2022)   PRAPARE - Hydrologist (Medical): No    Lack of Transportation (Non-Medical): No  Physical Activity: Inactive (04/02/2022)   Exercise Vital Sign    Days of Exercise per Week: 0 days    Minutes of Exercise per Session: 0 min  Stress: No Stress Concern Present (04/02/2022)   [place]    Feeling of Stress : Not at all  Social Connections: [place] (04/02/2022)   Social Connection and Isolation Panel [NHANES]    Frequency of Communication with Friends and Family: More than three times a week    Frequency of Social Gatherings with Friends and Family: More than three times a week    Attends Religious Services: More than 4 times per year    Active Member of [employer] or Organizations: Yes    Attends Music therapist: More than 4 times per year    Marital Status: Married  Human resources officer Violence: Not At Risk (04/02/2022)   Humiliation, Afraid, Rape, and Kick questionnaire    Fear of Current or Ex-Partner: No    Emotionally Abused: No    Physically Abused: No    Sexually Abused: No    Family History  Problem Relation Age of Onset   Bladder Cancer Father    Diabetes Father    Prostate cancer Father    Alzheimer's disease Mother    Diabetes Sister    Lung cancer Sister         smoker   Esophageal cancer Paternal Uncle    Colon cancer Neg Hx     ROS: no fevers or chills, productive cough, hemoptysis, dysphasia, odynophagia, melena, hematochezia,  dysuria, hematuria, rash, seizure activity, orthopnea, PND, pedal edema, claudication. Remaining systems are negative.  Physical Exam: Well-developed frail in no acute distress.  Skin is warm and  dry.  HEENT is normal.  Neck is supple.  Chest basilar crackles Cardiovascular exam is regular rate and rhythm.  3/6 systolic murmur left sternal border.  S2 is not diminished. Abdominal exam nontender or distended. No masses palpated. Extremities show no edema. neuro grossly intact   A/P  1 pericardial effusion-chronic and large but no evidence of tamponade.  No indication for pericardial window or pericardiocentesis at this point.  2 pulmonary hypertension-felt secondary to history of crest syndrome and interstitial lung disease as well as history of pulmonary embolus.  She is followed by pulmonary.  Continue present medications.  3 aortic stenosis/mitral stenosis-plan follow-up echocardiogram May 2024.  4 hypertension-blood pressure controlled.  Continue present medications.  5 history of AVMs and GI bleeding-followed by gastroenterology.  Kirk Ruths, MD

## 2022-07-28 ENCOUNTER — Ambulatory Visit (INDEPENDENT_AMBULATORY_CARE_PROVIDER_SITE_OTHER): Payer: Medicare PPO

## 2022-07-28 ENCOUNTER — Ambulatory Visit
Admission: RE | Admit: 2022-07-28 | Discharge: 2022-07-28 | Disposition: A | Discharge status: Home / Self Care | Payer: Medicare PPO | Source: Ambulatory Visit | Attending: Urgent Care | Admitting: Urgent Care

## 2022-07-28 VITALS — BP 117/51 | HR 74 | Temp 97.3°F | Resp 16

## 2022-07-28 DIAGNOSIS — R0781 Pleurodynia: Secondary | ICD-10-CM | POA: Diagnosis not present

## 2022-07-28 DIAGNOSIS — M546 Pain in thoracic spine: Secondary | ICD-10-CM | POA: Diagnosis not present

## 2022-07-28 DIAGNOSIS — Z86711 Personal history of pulmonary embolism: Secondary | ICD-10-CM

## 2022-07-28 DIAGNOSIS — R0782 Intercostal pain: Secondary | ICD-10-CM

## 2022-07-28 DIAGNOSIS — M4134 Thoracogenic scoliosis, thoracic region: Secondary | ICD-10-CM

## 2022-07-28 DIAGNOSIS — J90 Pleural effusion, not elsewhere classified: Secondary | ICD-10-CM | POA: Diagnosis not present

## 2022-07-28 DIAGNOSIS — J849 Interstitial pulmonary disease, unspecified: Secondary | ICD-10-CM | POA: Diagnosis not present

## 2022-07-28 DIAGNOSIS — M47814 Spondylosis without myelopathy or radiculopathy, thoracic region: Secondary | ICD-10-CM | POA: Diagnosis not present

## 2022-07-28 MED ORDER — PREDNISONE 10 MG PO TABS: 30.0000 mg | ORAL_TABLET | Freq: Every day | ORAL | 0 refills | Status: DC

## 2022-07-28 NOTE — ED Provider Notes (Signed)
[facility]  Note:  This document was prepared using Systems analyst and may include unintentional dictation errors.  MRN: [id number] DOB: 01-Nov-1935  Subjective:   Paula Ward is a 86 y.o. female presenting for recheck on persistent thoracic back pain, right rib pain.  At times the pain makes it difficult for her to breathe.  She does have a history of interstitial lung disease as well.  Has a remote history of pulmonary embolism.  In general she does not experience shortness of breath but has occasional difficulty taking a full breath from her pains.  No new falls.  X-rays were done when she suffered a fall in 0 29 2023 and all were negative for fracture.  No current facility-administered medications for this encounter.  Current Outpatient Medications:    acetaminophen (TYLENOL) 325 MG tablet, Take 2 tablets (650 mg total) by mouth every 6 (six) hours as needed for moderate pain., Disp: 30 tablet, Rfl: 0   albuterol (VENTOLIN HFA) 108 (90 Base) MCG/ACT inhaler, INHALE 2 PUFFS INTO THE LUNGS EVERY 6 HOURS AS NEEDED FOR WHEEZING OR SHORTNESS OF BREATH, Disp: 6.7 g, Rfl: 3   ALPRAZolam (XANAX) 0.25 MG tablet, Take 0.5 tablets (0.125 mg total) by mouth at bedtime as needed., Disp: 30 tablet, Rfl: 3   chlorhexidine (PERIDEX) 0.12 % solution, 15 mLs by Mouth Rinse route 2 (two) times daily as needed (sore mouth)., Disp: 120 mL, Rfl: 11   Cyanocobalamin (VITAMIN B-12 PO), Take 1 tablet by mouth 2 (two) times a week., Disp: , Rfl:    diltiazem (CARDIZEM CD) 120 MG 24 hr capsule, Take 120 mg by mouth in the morning., Disp: , Rfl:    fluticasone (FLONASE) 50 MCG/ACT nasal spray, Place 1 spray into both nostrils daily as needed for allergies., Disp: 48 g, Rfl: 0   furosemide (LASIX) 20 MG tablet, Take 1 tablet by mouth once daily, Disp: 90 tablet, Rfl: 0   guaiFENesin (MUCINEX) 600 MG 12 hr tablet, Take 150 mg by mouth daily., Disp: , Rfl:    ipratropium  (ATROVENT) 0.03 % nasal spray, Place 2 sprays into both nostrils every 12 (twelve) hours., Disp: 30 mL, Rfl: 12   magic mouthwash SOLN, Take 10 mLs by mouth 4 (four) times daily as needed for mouth pain., Disp: 15 mL, Rfl: 2   montelukast (SINGULAIR) 10 MG tablet, TAKE 1 TABLET BY MOUTH AT BEDTIME, Disp: 90 tablet, Rfl: 0   Multiple Vitamins-Iron (MULTIVITAMIN/IRON PO), Take 1 tablet by mouth daily., Disp: , Rfl:    Multiple Vitamins-Minerals (ZINC PO), Take 1 tablet by mouth daily., Disp: , Rfl:    OPSUMIT 10 MG TABS, Take 10 mg by mouth daily., Disp: , Rfl:    OXYGEN, Inhale into the lungs. 2 LMP at night and upon exertion, Disp: , Rfl:    pantoprazole (PROTONIX) 40 MG tablet, Take 1 tablet (40 mg total) by mouth 2 (two) times daily before a meal., Disp: 60 tablet, Rfl: 5   Pyridoxine HCl (VITAMIN B6 PO), Take 1 tablet by mouth as needed. Unsure of dose, Disp: , Rfl:    spironolactone (ALDACTONE) 25 MG tablet, Take 12.5 mg by mouth daily., Disp: , Rfl:    sucralfate (CARAFATE) 1 GM/10ML suspension, Take 10 mLs (1 g total) by mouth every 6 (six) hours as needed (reflux)., Disp: 420 mL, Rfl: 1   Allergies  Allergen Reactions   Codeine Nausea Only    Hallucinations, too   Other  Nausea And Vomiting    "-mycin" antibiotics.   Also cause hallucinations.   Erythromycin Nausea And Vomiting   Iodinated Contrast Media Other (See Comments)    Renal issues   Lisinopril     Pt doesn't remember    Mycophenolate Mofetil Nausea Only   Sulfa Antibiotics Other (See Comments)    unknown    Past Medical History:  Diagnosis Date   Anemia    Angiodysplasia of stomach and duodenum    Arthus phenomenon    AVM (arteriovenous malformation)    Blood transfusion without reported diagnosis    last 4 units 12-22-15, Iron infusion x2 last -01-07-16,01-14-16.   Breast cancer ([place]) 1989   Left   Candida esophagitis (HCC)    Cataract    Chronic kidney disease    Chronic mild renal insuffiency   CREST syndrome  (HCC)    GERD (gastroesophageal reflux disease)    w/ HH   Interstitial lung disease (HCC)    Nodule of kidney    Pericardial effusion    PONV (postoperative nausea and vomiting)    Pulmonary embolus ([place]) 2003   Pulmonary hypertension ([place])    followed by Dr Gaynell Face at [facility], now Dr. Maryjean Ka visit end 2'17.   Rectal incontinence    Renal cell carcinoma (HCC)    Scleroderma (HCC)    Tubular adenoma of colon    Uterine polyp      Past Surgical History:  Procedure Laterality Date   APPENDECTOMY  1962   BREAST LUMPECTOMY  1989   left   CATARACT EXTRACTION, BILATERAL Bilateral 12/2013   COLONOSCOPY WITH PROPOFOL N/A 04/15/2021   Procedure: COLONOSCOPY WITH PROPOFOL;  Surgeon: Milus Banister, MD;  Location: [facility];  Service: Endoscopy;  Laterality: N/A;   ENTEROSCOPY N/A 01/18/2016   Procedure: ENTEROSCOPY;  Surgeon: Mauri Pole, MD;  Location: [facility];  Service: Endoscopy;  Laterality: N/A;   ENTEROSCOPY N/A 05/24/2018   Procedure: ENTEROSCOPY;  Surgeon: Jackquline Denmark, MD;  Location: [facility];  Service: Endoscopy;  Laterality: N/A;   ENTEROSCOPY N/A 03/29/2021   Procedure: ENTEROSCOPY;  Surgeon: Jerene Bears, MD;  Location: [facility];  Service: Gastroenterology;  Laterality: N/A;   ENTEROSCOPY N/A 04/13/2021   Procedure: ENTEROSCOPY;  Surgeon: Yetta Flock, MD;  Location: [facility];  Service: Gastroenterology;  Laterality: N/A;   ESOPHAGOGASTRODUODENOSCOPY (EGD) WITH PROPOFOL N/A 12/21/2015   Procedure: ESOPHAGOGASTRODUODENOSCOPY (EGD) WITH PROPOFOL;  Surgeon: Irene Shipper, MD;  Location: [facility];  Service: Endoscopy;  Laterality: N/A;   HOT HEMOSTASIS N/A 05/24/2018   Procedure: HOT HEMOSTASIS (ARGON PLASMA COAGULATION/BICAP);  Surgeon: Jackquline Denmark, MD;  Location: [facility];  Service: Endoscopy;  Laterality: N/A;   HOT HEMOSTASIS N/A 03/29/2021   Procedure: HOT HEMOSTASIS (ARGON PLASMA COAGULATION/BICAP);  Surgeon: Jerene Bears, MD;   Location: [facility];  Service: Gastroenterology;  Laterality: N/A;   HOT HEMOSTASIS N/A 04/13/2021   Procedure: HOT HEMOSTASIS (ARGON PLASMA COAGULATION/BICAP);  Surgeon: Yetta Flock, MD;  Location: [facility];  Service: Gastroenterology;  Laterality: N/A;   IR GENERIC HISTORICAL  06/05/2016   IR RADIOLOGIST EVAL & MGMT 06/05/2016 Aletta Edouard, MD [facility] INTERV RAD   IVC Filter     KIDNEY SURGERY     left -"laser surgery by Dr. Kathlene Cote- 5 yrs ago" no removal   RIGHT/LEFT HEART CATH AND CORONARY ANGIOGRAPHY N/A 04/25/2022   Procedure: RIGHT/LEFT HEART CATH AND CORONARY ANGIOGRAPHY;  Surgeon: Sherren Mocha, MD;  Location: [facility];  Service: Cardiovascular;  Laterality: N/A;   SCHLEROTHERAPY  05/24/2018   Procedure: Woodward Ku;  Surgeon: Jackquline Denmark, MD;  Location: [facility];  Service: Endoscopy;;   SUBMUCOSAL TATTOO INJECTION  04/13/2021   Procedure: SUBMUCOSAL TATTOO INJECTION;  Surgeon: Yetta Flock, MD;  Location: [facility];  Service: Gastroenterology;;   TONSILLECTOMY     TUBAL LIGATION      Family History  Problem Relation Age of Onset   Bladder Cancer Father    Diabetes Father    Prostate cancer Father    Alzheimer's disease Mother    Diabetes Sister    Lung cancer Sister         smoker   Esophageal cancer Paternal Uncle    Colon cancer Neg Hx     Social History   Tobacco Use   Smoking status: Never   Smokeless tobacco: Never  Vaping Use   Vaping Use: Never used  Substance Use Topics   Alcohol use: No   Drug use: No    ROS   Objective:   Vitals: BP (!) 117/51 (BP Location: Left Arm)   Pulse 74   Temp (!) 97.3 F (36.3 C) (Oral)   Resp 16   SpO2 95%   Physical Exam Constitutional:      General: She is not in acute distress.    Appearance: Normal appearance. She is well-developed. She is not ill-appearing, toxic-appearing or diaphoretic.  HENT:     Head: Normocephalic and atraumatic.     Nose: Nose normal.      Mouth/Throat:     Mouth: Mucous membranes are moist.  Eyes:     General: No scleral icterus.       Right eye: No discharge.        Left eye: No discharge.     Extraocular Movements: Extraocular movements intact.  Cardiovascular:     Rate and Rhythm: Normal rate and regular rhythm.     Heart sounds: Normal heart sounds. No murmur heard.    No friction rub. No gallop.  Pulmonary:     Effort: Pulmonary effort is normal. No respiratory distress.     Breath sounds: No stridor. No wheezing, rhonchi or rales.  Chest:     Chest wall: No tenderness.  Musculoskeletal:     Comments: Patient has tenderness along the upper back of the thoracic region and radiates along the right lateral side and right lower ribs.  No swelling, ecchymosis, bony deformity.  Skin:    General: Skin is warm and dry.  Neurological:     General: No focal deficit present.     Mental Status: She is alert and oriented to person, place, and time.  Psychiatric:        Mood and Affect: Mood normal.        Behavior: Behavior normal.    DG Chest 2 View  Result Date: 07/28/2022 CLINICAL DATA:  Rib pain and back pain EXAM: CHEST - 2 VIEW COMPARISON:  Chest x-ray dated July 18, 2022 FINDINGS: Marked enlargement of the cardiac contours, unchanged when compared with prior and likely due to pericardial effusion. Coarse bilateral reticular opacities, similar to prior and compatible with interstitial lung disease. No new airspace opacity. Chronic trace left pleural effusion, unchanged when compared to prior. IMPRESSION: 1. Background interstitial lung disease with no evidence of acute airspace opacity. 2. Marked enlargement of the cardiac contours, unchanged when compared with prior and likely due to pericardial effusion. 3. Chronic trace left pleural effusion. Electronically Signed  By: Yetta Glassman M.D.   On: 07/28/2022 14:42    DG Thoracic Spine 2 View  Result Date: 07/18/2022 CLINICAL DATA:  Right thoracic pain after  fall EXAM: THORACIC SPINE 2 VIEWS COMPARISON:  None Available. FINDINGS: Frontal and lateral views of the thoracic spine are obtained. Stable left convex thoracic scoliosis. Otherwise alignment is anatomic. No acute fractures. Paraspinal soft tissues are unremarkable. Stable marked enlargement the cardiac silhouette. IMPRESSION: 1. Stable left convex thoracic scoliosis and mild spondylosis. No acute bony abnormality. Electronically Signed   By: Randa Ngo M.D.   On: 07/18/2022 16:14   DG Ribs Unilateral W/Chest Right  Result Date: 07/18/2022 CLINICAL DATA:  Right-sided thoracic back pain after fall EXAM: RIGHT RIBS AND CHEST - 3+ VIEW COMPARISON:  03/17/2022 FINDINGS: Frontal view of the chest as well as frontal and oblique views of the right thoracic cage are obtained. Marked enlargement the cardiac silhouette again noted unchanged. Chronic left basilar opacity may reflect combination of atelectasis and/or effusion. No pneumothorax. There are no acute displaced fractures. Stable mild left convex thoracic scoliosis. IMPRESSION: 1. No acute displaced fracture. 2. Stable enlarged cardiac silhouette. 3. Chronic left basilar consolidation and/or effusion. Electronically Signed   By: Randa Ngo M.D.   On: 07/18/2022 16:13   DG Elbow Complete Right  Result Date: 07/18/2022 CLINICAL DATA:  Fall.  Right elbow pain. EXAM: RIGHT ELBOW - COMPLETE 3+ VIEW COMPARISON:  None Available. FINDINGS: There is no evidence of fracture, dislocation, or joint effusion. Irregularity along the proximal forearm soft tissues and there is probably a bandage in this area. IMPRESSION: No acute bone abnormality. Electronically Signed   By: Markus Daft M.D.   On: 07/18/2022 16:10   DG Knee Complete 4 Views Right  Result Date: 07/18/2022 CLINICAL DATA:  Right knee pain after fall EXAM: RIGHT KNEE - COMPLETE 4+ VIEW COMPARISON:  06/10/2022 FINDINGS: Frontal, bilateral oblique, lateral views of the right knee are obtained on 4  images. No acute fracture, subluxation, or dislocation. Stable mild 3 compartmental osteoarthritis and chondrocalcinosis. No joint effusion. Soft tissues are unremarkable. IMPRESSION: 1. Stable right knee osteoarthritis.  No acute fracture. Electronically Signed   By: Randa Ngo M.D.   On: 07/18/2022 16:10   [place] LIMITED JOINT SPACE STRUCTURES LOW RIGHT(NO LINKED CHARGES)  Result Date: 07/18/2022 Diagnostic Limited MSK Ultrasound of: Right knee Quad tendon intact normal-appearing Small joint effusion present superior patellar space. Patella tendon intact normal. Lateral joint line degenerative appearing but otherwise normal appearing Medial joint line degenerative appearing medial meniscus. At medial proximal tibia there is a small cortical defect with area of increased Doppler activity which is concerning for potential fracture. Impression: Possible tibial plateau fracture.  Medial DJD.   VAS [place] LOWER EXTREMITY VENOUS (DVT)  Result Date: 06/25/2022  Lower Venous DVT Study Patient Name:  Paula Ward  Date of Exam:   06/25/2022 Medical Rec #: [id number]        Accession #:    [id number] Date of Birth: 22-Dec-1935        Patient Gender: F Patient Age:   33 years Exam Location:  [facility] Procedure:      VAS [place] LOWER EXTREMITY VENOUS (DVT) Referring Phys: Ellard Artis COREY --------------------------------------------------------------------------------  Other Indications: Patient complains of bilateral calf and ankle swelling for                    two weeks. Denies SOB. Risk Factors: None identified. Performing Technologist: Wilkie Aye RVT  Examination  Guidelines: A complete evaluation includes B-mode imaging, spectral Doppler, color Doppler, and power Doppler as needed of all accessible portions of each vessel. Bilateral testing is considered an integral part of a complete examination. Limited examinations for reoccurring indications may be performed as noted. The reflux portion of the exam is performed with the  patient in reverse Trendelenburg.  +---------+---------------+---------+-----------+----------+--------------+ RIGHT    CompressibilityPhasicitySpontaneityPropertiesThrombus Aging +---------+---------------+---------+-----------+----------+--------------+ CFV      Full           Yes      Yes                                 +---------+---------------+---------+-----------+----------+--------------+ SFJ      Full           Yes      Yes                                 +---------+---------------+---------+-----------+----------+--------------+ FV Prox  Full           Yes      Yes                                 +---------+---------------+---------+-----------+----------+--------------+ FV Mid   Full           Yes      Yes                                 +---------+---------------+---------+-----------+----------+--------------+ FV DistalFull           Yes      Yes                                 +---------+---------------+---------+-----------+----------+--------------+ PFV      Full           Yes      Yes                                 +---------+---------------+---------+-----------+----------+--------------+ POP      Full           Yes      Yes                                 +---------+---------------+---------+-----------+----------+--------------+ PTV      Full           Yes      Yes                                 +---------+---------------+---------+-----------+----------+--------------+ PERO     Full           Yes      Yes                                 +---------+---------------+---------+-----------+----------+--------------+ Gastroc  Full                                                        +---------+---------------+---------+-----------+----------+--------------+  GSV      Full           Yes      Yes                                 +---------+---------------+---------+-----------+----------+--------------+    +---------+---------------+---------+-----------+----------+--------------+ LEFT     CompressibilityPhasicitySpontaneityPropertiesThrombus Aging +---------+---------------+---------+-----------+----------+--------------+ CFV      Full           Yes      Yes                                 +---------+---------------+---------+-----------+----------+--------------+ SFJ      Full           Yes      Yes                                 +---------+---------------+---------+-----------+----------+--------------+ FV Prox  Full           Yes      Yes                                 +---------+---------------+---------+-----------+----------+--------------+ FV Mid   Full           Yes      Yes                                 +---------+---------------+---------+-----------+----------+--------------+ FV DistalFull           Yes      Yes                                 +---------+---------------+---------+-----------+----------+--------------+ PFV      Full           Yes      Yes                                 +---------+---------------+---------+-----------+----------+--------------+ POP      Full           Yes      Yes                                 +---------+---------------+---------+-----------+----------+--------------+ PTV      Full           Yes      Yes                                 +---------+---------------+---------+-----------+----------+--------------+ PERO     Full           Yes      Yes                                 +---------+---------------+---------+-----------+----------+--------------+ Gastroc  Full                                                        +---------+---------------+---------+-----------+----------+--------------+  GSV      Full           Yes      Yes                                 +---------+---------------+---------+-----------+----------+--------------+     Summary: BILATERAL: - No evidence of deep  vein thrombosis seen in the lower extremities, bilaterally. - No evidence of superficial venous thrombosis in the lower extremities, bilaterally. -No evidence of popliteal cyst, bilaterally.  - Interstitial fluid in bilateral calf and ankles.  *See table(s) above for measurements and observations. Electronically signed by Jenkins Rouge MD on 06/25/2022 at 4:28:01 PM.    Final    DG Knee AP/LAT W/Sunrise Right  Result Date: 06/11/2022 CLINICAL DATA:  Right-sided intermittent knee pain EXAM: RIGHT KNEE 3 VIEWS COMPARISON:  None Available. FINDINGS: No fracture or malalignment. Minimal patellofemoral degenerative change. No sizable knee effusion. Mild joint space calcification. IMPRESSION: Minimal degenerative change.  Chondrocalcinosis. Electronically Signed   By: Donavan Foil M.D.   On: 06/11/2022 23:19     Assessment and Plan :   PDMP not reviewed this encounter.  1. Acute right-sided thoracic back pain   2. Rib pain on right side   3. History of pulmonary embolism   4. Interstitial lung disease ([place])   5. Thoracogenic scoliosis of thoracic region   6. Thoracic spondylosis without myelopathy     I have low suspicion for recurrent pulmonary embolism, fracture.  Offered an oral prednisone course in the context of her interstitial lung disease, degenerative changes, left convex thoracic scoliosis and spondylosis. Counseled patient on potential for adverse effects with medications prescribed/recommended today, ER and return-to-clinic precautions discussed, patient verbalized understanding.    Jaynee Eagles, PA-C 07/28/22 1457

## 2022-07-28 NOTE — ED Triage Notes (Signed)
Patient presents to [facility] for follow-up from 09/29 visit. She states she continues to have SOB and right upper back pain. Taking Tylenol. States pain worse with cough, turning, and taking a deep breath.

## 2022-07-29 DIAGNOSIS — R490 Dysphonia: Secondary | ICD-10-CM | POA: Diagnosis not present

## 2022-07-29 DIAGNOSIS — K219 Gastro-esophageal reflux disease without esophagitis: Secondary | ICD-10-CM | POA: Diagnosis not present

## 2022-08-01 ENCOUNTER — Encounter (HOSPITAL_COMMUNITY): Payer: Medicare PPO

## 2022-08-04 ENCOUNTER — Ambulatory Visit (INDEPENDENT_AMBULATORY_CARE_PROVIDER_SITE_OTHER): Payer: Medicare PPO

## 2022-08-04 VITALS — BP 113/58 | HR 76 | Temp 98.5°F | Resp 16 | Ht 63.0 in | Wt 107.0 lb

## 2022-08-04 DIAGNOSIS — D500 Iron deficiency anemia secondary to blood loss (chronic): Secondary | ICD-10-CM | POA: Diagnosis not present

## 2022-08-04 DIAGNOSIS — N183 Chronic kidney disease, stage 3 unspecified: Secondary | ICD-10-CM

## 2022-08-04 MED ORDER — SODIUM CHLORIDE 0.9 % IV SOLN
510.0000 mg | Freq: Once | INTRAVENOUS | Status: AC
  Administered 2022-08-04: 510 mg via INTRAVENOUS
  Filled 2022-08-04: qty 17

## 2022-08-04 NOTE — Progress Notes (Signed)
Diagnosis: Iron Deficiency Anemia  Provider:  Marshell Garfinkel MD  Procedure: Infusion  IV Type: Peripheral, IV Location: L Antecubital  Feraheme (Ferumoxytol), Dose: 510 mg  Infusion Start Time: 8208  Infusion Stop Time: 1388  Post Infusion IV Care: Peripheral IV Discontinued  Discharge: Condition: Good, Destination: Home . AVS provided to patient.   Performed by:  Arnoldo Morale, RN

## 2022-08-06 ENCOUNTER — Encounter: Payer: Self-pay | Admitting: Gastroenterology

## 2022-08-08 ENCOUNTER — Encounter: Payer: Self-pay | Admitting: Cardiology

## 2022-08-08 ENCOUNTER — Ambulatory Visit: Payer: Medicare PPO | Attending: Cardiology | Admitting: Cardiology

## 2022-08-08 VITALS — BP 116/58 | HR 58 | Wt 108.2 lb

## 2022-08-08 DIAGNOSIS — I350 Nonrheumatic aortic (valve) stenosis: Secondary | ICD-10-CM

## 2022-08-08 DIAGNOSIS — I5032 Chronic diastolic (congestive) heart failure: Secondary | ICD-10-CM

## 2022-08-08 DIAGNOSIS — I272 Pulmonary hypertension, unspecified: Secondary | ICD-10-CM | POA: Diagnosis not present

## 2022-08-08 DIAGNOSIS — I3139 Other pericardial effusion (noninflammatory): Secondary | ICD-10-CM | POA: Diagnosis not present

## 2022-08-08 NOTE — Patient Instructions (Signed)
  Follow-Up: At [facility], you and your health needs are our priority.  As part of our continuing mission to provide you with exceptional heart care, we have created designated Provider Care Teams.  These Care Teams include your primary Cardiologist (physician) and Advanced Practice Providers (APPs -  Physician Assistants and Nurse Practitioners) who all work together to provide you with the care you need, when you need it.  We recommend signing up for the patient portal called "MyChart".  Sign up information is provided on this After Visit Summary.  MyChart is used to connect with patients for Virtual Visits (Telemedicine).  Patients are able to view lab/test results, encounter notes, upcoming appointments, etc.  Non-urgent messages can be sent to your provider as well.   To learn more about what you can do with MyChart, go to [email or web address].    Your next appointment:   6 month(s)  The format for your next appointment:   In Person  Provider:   Kirk Ruths, MD

## 2022-08-11 ENCOUNTER — Ambulatory Visit (INDEPENDENT_AMBULATORY_CARE_PROVIDER_SITE_OTHER): Payer: Medicare PPO

## 2022-08-11 VITALS — BP 122/56 | HR 76 | Temp 98.1°F | Resp 18 | Ht 63.0 in | Wt 108.8 lb

## 2022-08-11 DIAGNOSIS — N183 Chronic kidney disease, stage 3 unspecified: Secondary | ICD-10-CM

## 2022-08-11 DIAGNOSIS — D500 Iron deficiency anemia secondary to blood loss (chronic): Secondary | ICD-10-CM | POA: Diagnosis not present

## 2022-08-11 MED ORDER — SODIUM CHLORIDE 0.9 % IV SOLN
510.0000 mg | Freq: Once | INTRAVENOUS | Status: AC
  Administered 2022-08-11: 510 mg via INTRAVENOUS
  Filled 2022-08-11: qty 17

## 2022-08-11 MED ORDER — ACETAMINOPHEN 325 MG PO TABS: 650.0000 mg | ORAL_TABLET | Freq: Once | ORAL | Status: DC

## 2022-08-11 MED ORDER — DIPHENHYDRAMINE HCL 25 MG PO CAPS: 25.0000 mg | ORAL_CAPSULE | Freq: Once | ORAL | Status: DC

## 2022-08-11 NOTE — Progress Notes (Addendum)
Diagnosis: Iron Deficiency Anemia  Provider:  Marshell Garfinkel MD  Procedure: Infusion  IV Type: Peripheral, IV Location: L Antecubital  Feraheme (Ferumoxytol), Dose: 510 mg  Infusion Start Time: 2909  Infusion Stop Time: 1430  Post Infusion IV Care: Peripheral IV Discontinued  Discharge: Condition: Good, Destination: Home . AVS provided to patient.   Performed by:  Arnoldo Morale, RN

## 2022-08-12 ENCOUNTER — Telehealth: Payer: Self-pay

## 2022-08-12 NOTE — Patient Outreach (Signed)
  Care Coordination   08/12/2022 Name: Paula Ward MRN: [id number] DOB: [birth year]   Care Coordination Outreach Attempts:  A second unsuccessful outreach was attempted today to offer the patient with information about available care coordination services as a benefit of their health plan.     Follow Up Plan:  Additional outreach attempts will be made to offer the patient care coordination information and services.   Encounter Outcome:  No Answer  Care Coordination Interventions Activated:  No   Care Coordination Interventions:  No, not indicated    Thea Silversmith, RN, MSN, BSN, [place] Coordinator [phone]

## 2022-08-13 ENCOUNTER — Ambulatory Visit: Payer: Medicare PPO | Admitting: Pulmonary Disease

## 2022-08-14 DIAGNOSIS — L84 Corns and callosities: Secondary | ICD-10-CM | POA: Diagnosis not present

## 2022-08-14 DIAGNOSIS — D225 Melanocytic nevi of trunk: Secondary | ICD-10-CM | POA: Diagnosis not present

## 2022-08-14 DIAGNOSIS — L821 Other seborrheic keratosis: Secondary | ICD-10-CM | POA: Diagnosis not present

## 2022-08-14 DIAGNOSIS — D2261 Melanocytic nevi of right upper limb, including shoulder: Secondary | ICD-10-CM | POA: Diagnosis not present

## 2022-08-14 DIAGNOSIS — D1801 Hemangioma of skin and subcutaneous tissue: Secondary | ICD-10-CM | POA: Diagnosis not present

## 2022-08-14 DIAGNOSIS — D1723 Benign lipomatous neoplasm of skin and subcutaneous tissue of right leg: Secondary | ICD-10-CM | POA: Diagnosis not present

## 2022-08-14 DIAGNOSIS — I788 Other diseases of capillaries: Secondary | ICD-10-CM | POA: Diagnosis not present

## 2022-08-15 ENCOUNTER — Ambulatory Visit (HOSPITAL_COMMUNITY)
Admission: RE | Admit: 2022-08-15 | Discharge: 2022-08-15 | Disposition: A | Discharge status: Home / Self Care | Payer: Medicare PPO | Source: Ambulatory Visit | Attending: Nephrology | Admitting: Nephrology

## 2022-08-15 ENCOUNTER — Other Ambulatory Visit (INDEPENDENT_AMBULATORY_CARE_PROVIDER_SITE_OTHER): Payer: Medicare PPO

## 2022-08-15 VITALS — BP 134/39 | HR 70 | Temp 97.7°F | Resp 18

## 2022-08-15 DIAGNOSIS — I272 Pulmonary hypertension, unspecified: Secondary | ICD-10-CM | POA: Diagnosis not present

## 2022-08-15 DIAGNOSIS — N1832 Chronic kidney disease, stage 3b: Secondary | ICD-10-CM | POA: Diagnosis not present

## 2022-08-15 DIAGNOSIS — D638 Anemia in other chronic diseases classified elsewhere: Secondary | ICD-10-CM | POA: Diagnosis not present

## 2022-08-15 LAB — COMPREHENSIVE METABOLIC PANEL
ALT: 13 U/L (ref 0–35)
AST: 23 U/L (ref 0–37)
Albumin: 4 g/dL (ref 3.5–5.2)
Alkaline Phosphatase: 56 U/L (ref 39–117)
BUN: 34 mg/dL — ABNORMAL HIGH (ref 6–23)
CO2: 33 mEq/L — ABNORMAL HIGH (ref 19–32)
Calcium: 9.2 mg/dL (ref 8.4–10.5)
Chloride: 99 mEq/L (ref 96–112)
Creatinine, Ser: 1.18 mg/dL (ref 0.40–1.20)
GFR: 42.57 mL/min — ABNORMAL LOW (ref >60.00)
Glucose, Bld: 86 mg/dL (ref 70–99)
Potassium: 4.2 mEq/L (ref 3.5–5.1)
Sodium: 137 mEq/L (ref 135–145)
Total Bilirubin: 0.6 mg/dL (ref 0.2–1.2)
Total Protein: 6.4 g/dL (ref 6.0–8.3)

## 2022-08-15 LAB — CBC
HCT: 31.3 % — ABNORMAL LOW (ref 36.0–46.0)
Hemoglobin: 10.4 g/dL — ABNORMAL LOW (ref 12.0–15.0)
MCHC: 33.3 g/dL (ref 30.0–36.0)
MCV: 105 fl — ABNORMAL HIGH (ref 78.0–100.0)
Platelets: 162 10*3/uL (ref 150.0–400.0)
RBC: 2.99 Mil/uL — ABNORMAL LOW (ref 3.87–5.11)
RDW: 14.8 % (ref 11.5–15.5)
WBC: 6.1 10*3/uL (ref 4.0–10.5)

## 2022-08-15 LAB — POCT HEMOGLOBIN-HEMACUE: Hemoglobin: 10 g/dL — ABNORMAL LOW (ref 12.0–15.0)

## 2022-08-15 LAB — IRON AND TIBC
Iron: 197 ug/dL — ABNORMAL HIGH (ref 28–170)
Saturation Ratios: 100 % — ABNORMAL HIGH (ref 10.4–31.8)
TIBC: 197 ug/dL — ABNORMAL LOW (ref 250–450)
UIBC: 0 ug/dL

## 2022-08-15 LAB — FERRITIN: Ferritin: 1281 ng/mL — ABNORMAL HIGH (ref 11–307)

## 2022-08-15 MED ORDER — EPOETIN ALFA-EPBX 10000 UNIT/ML IJ SOLN
INTRAMUSCULAR | Status: AC
  Filled 2022-08-15: qty 1

## 2022-08-15 MED ORDER — EPOETIN ALFA-EPBX 10000 UNIT/ML IJ SOLN
10000.0000 [IU] | INTRAMUSCULAR | Status: DC
  Administered 2022-08-15: 10000 [IU] via SUBCUTANEOUS

## 2022-08-30 ENCOUNTER — Encounter: Payer: Self-pay | Admitting: Internal Medicine

## 2022-09-02 ENCOUNTER — Telehealth: Payer: Self-pay

## 2022-09-02 NOTE — Patient Outreach (Signed)
  Care Coordination   09/02/2022 Name: Paula Ward MRN: [id number] DOB: 1936-04-01   Care Coordination Outreach Attempts:  A third unsuccessful outreach was attempted today to offer the patient with information about available care coordination services as a benefit of their health plan.   Follow Up Plan:  No further outreach attempts will be made at this time. We have been unable to contact the patient to offer or enroll patient in care coordination services  Encounter Outcome:  No Answer  Care Coordination Interventions Activated:  No   Care Coordination Interventions:  No, not indicated    Thea Silversmith, RN, MSN, BSN, [place] Coordinator [phone]

## 2022-09-04 ENCOUNTER — Encounter: Payer: Self-pay | Admitting: Pulmonary Disease

## 2022-09-04 ENCOUNTER — Ambulatory Visit: Payer: Medicare PPO | Admitting: Pulmonary Disease

## 2022-09-04 VITALS — BP 116/68 | HR 87 | Ht 63.0 in | Wt 109.2 lb

## 2022-09-04 DIAGNOSIS — I272 Pulmonary hypertension, unspecified: Secondary | ICD-10-CM

## 2022-09-04 DIAGNOSIS — J849 Interstitial pulmonary disease, unspecified: Secondary | ICD-10-CM

## 2022-09-04 DIAGNOSIS — M349 Systemic sclerosis, unspecified: Secondary | ICD-10-CM | POA: Diagnosis not present

## 2022-09-04 NOTE — Progress Notes (Signed)
Synopsis: Referred in November 2022 for ILD by Harl Bowie, MD  Subjective:   PATIENT ID: Paula Ward GENDER: female DOB: June 14, 1936, MRN: [id number]  HPI  Chief Complaint  Patient presents with   Follow-up    F/U for ILD. States she has been doing well since last visit. Still using O2 at night.    Paula Ward is an 86 year old woman, never smoker with history of pulmonary hypertension, AVMs of small bowel, chronic blood loss anemia, CKD, scleroderma who returns to pulmonary clinic for ILD.   She has been doing fine since lats visit. She reports her nasal drainage is much improved on nasal sprays. She continues opsumit 34m daily.   OV 05/12/22 CT Chest scan 04/23/22 showed resolution of opacities from her 02/2022 scan with stable subpleural reticulation. She has been doing ok since last visit. She does complain of on going sinus drainage.   OV 02/03/22 She has been doing well since last visit. She reports feeling better after an increase in her hemoglobin from 9 to 12g/dL with epo injections. She is also receiving iron infusions.   [facility]   PFTs today show moderate restriction and moderate diffusion defect.   OV 08/28/21 She has been followed by Dr. SLavonda Jumbofor ILD and Dr. PChrista [facility]efor pulmonary hypertension [facility].  She has been referred to our pulmonary clinic to establish care [place]  At [facility] time [facility] of [facility].  She reports she has progressive shortness of breath especially with exertion.  She was previously on immunosuppression for treatment of her interstitial lung disease related to scleroderma but she did not tolerate this treatment which was discontinued due to side effects.  Antifibrotic therapy was discussed on her previous visits but has not  been initiated due to the side effect profile.  She remains on macitentan for her pulmonary hypertension.  She was previously on supplemental oxygen therapy but [facility] off oxygen at rest and with exertion.  She denies any chest pain, syncopal episodes or lower extremity edema.  She is a never smoker.  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  She lives at home with her husband.  Past Medical History:  Diagnosis Date   Anemia    Angiodysplasia of stomach and duodenum    Arthus phenomenon    AVM (arteriovenous malformation)    Blood transfusion without reported diagnosis    last 4 units 12-22-15, Iron infusion x2 last -01-07-16,01-14-16.   [place]   Left   Candida esophagitis (HCC)    Cataract    Chronic kidney disease    Chronic mild renal insuffiency   CREST syndrome (HCC)    GERD (gastroesophageal reflux disease)    w/ HH   Interstitial lung disease (HCC)    Nodule of kidney    Pericardial effusion    PONV (postoperative nausea and vomiting)    [place]   [place] [facility]    [facility] Dr. RMaryjean Kavisit end 2'17.   Rectal incontinence    Renal cell carcinoma (HCC)  Scleroderma ([place])    Tubular adenoma of colon    Uterine polyp      Family History  Problem Relation Age of Onset   Bladder Cancer Father    Diabetes Father    Prostate cancer Father    Alzheimer's disease Mother    Diabetes Sister    Lung cancer Sister         smoker   Esophageal cancer Paternal Uncle    Colon cancer Neg Hx      Social History   Socioeconomic History   Marital status: Married    Spouse name: Not on file   Number of children: 2   Years of education: Not on file   Highest education level: Not on file  Occupational History   Occupation: Retired   Tobacco Use   Smoking status: Never   Smokeless tobacco: Never  Vaping Use   Vaping Use: Never used  Substance and Sexual Activity   Alcohol use: No   Drug use: No   Sexual activity: Not Currently  Other Topics Concern   Not on file  Social History Narrative   Married '611 son- '65, 1 daughter '63; 6 children (2 adopted)SO- SOBRetirement- doing well. Marriage in good health. Patient has never smoked. Alcohol use- noPt gets regular exercise   Social Determinants of Health   Financial Resource Strain: Low Risk  (04/02/2022)   Overall Financial Resource Strain (CARDIA)    Difficulty of Paying Living Expenses: Not hard at all  Food Insecurity: No Food Insecurity (04/02/2022)   Hunger Vital Sign    Worried About Running Out of Food in the Last Year: Never true    Ran Out of Food in the Last Year: Never true  Transportation Needs: No Transportation Needs (04/02/2022)   PRAPARE - Hydrologist (Medical): No    Lack of Transportation (Non-Medical): No  Physical Activity: Inactive (04/02/2022)   Exercise Vital Sign    Days of Exercise per Week: 0 days    Minutes of Exercise per Session: 0 min  Stress: No Stress Concern Present (04/02/2022)   [place]    Feeling of Stress : Not at all  Social Connections: [place] (04/02/2022)   Social Connection and Isolation Panel [NHANES]    Frequency of Communication with Friends and Family: More than three times a week    Frequency of Social Gatherings with Friends and Family: More than three times a week    Attends Religious Services: More than 4 times per year    Active Member of [employer] or Organizations: Yes    Attends Music therapist: More than 4 times per year    Marital Status: Married  Human resources officer Violence: Not At Risk (04/02/2022)   Humiliation, Afraid, Rape, and Kick questionnaire    Fear of Current or Ex-Partner: No     Emotionally Abused: No    Physically Abused: No    Sexually Abused: No     Allergies  Allergen Reactions   Codeine Nausea Only    Hallucinations, too   Other Nausea And Vomiting    "-mycin" antibiotics.   Also cause hallucinations.   Erythromycin Nausea And Vomiting   Iodinated Contrast Media Other (See Comments)    Renal issues   Lisinopril     Pt doesn't remember    Mycophenolate Mofetil Nausea Only   Sulfa Antibiotics Other (See Comments)    unknown  Outpatient Medications Prior to Visit  Medication Sig Dispense Refill   acetaminophen (TYLENOL) 325 MG tablet Take 2 tablets (650 mg total) by mouth every 6 (six) hours as needed for moderate pain. 30 tablet 0   albuterol (VENTOLIN HFA) 108 (90 Base) MCG/ACT inhaler INHALE 2 PUFFS INTO THE LUNGS EVERY 6 HOURS AS NEEDED FOR WHEEZING OR SHORTNESS OF BREATH 6.7 g 3   ALPRAZolam (XANAX) 0.25 MG tablet Take 0.5 tablets (0.125 mg total) by mouth at bedtime as needed. 30 tablet 3   Cyanocobalamin (VITAMIN B-12 PO) Take 1 tablet by mouth 2 (two) times a week.     diltiazem (CARDIZEM CD) 120 MG 24 hr capsule Take 120 mg by mouth in the morning.     fluticasone (FLONASE) 50 MCG/ACT nasal spray Place 1 spray into both nostrils daily as needed for allergies. 48 g 0   furosemide (LASIX) 20 MG tablet Take 1 tablet by mouth once daily 90 tablet 0   guaiFENesin (MUCINEX) 600 MG 12 hr tablet Take 150 mg by mouth daily.     ipratropium (ATROVENT) 0.03 % nasal spray Place 2 sprays into both nostrils every 12 (twelve) hours. 30 mL 12   magic mouthwash SOLN Take 10 mLs by mouth 4 (four) times daily as needed for mouth pain. 15 mL 2   montelukast (SINGULAIR) 10 MG tablet TAKE 1 TABLET BY MOUTH AT BEDTIME 90 tablet 0   Multiple Vitamins-Iron (MULTIVITAMIN/IRON PO) Take 1 tablet by mouth daily.     Multiple Vitamins-Minerals (ZINC PO) Take 1 tablet by mouth daily.     OPSUMIT 10 MG TABS Take 10 mg by mouth daily.     OXYGEN Inhale into the lungs. 2  LMP at night and upon exertion     pantoprazole (PROTONIX) 40 MG tablet Take 1 tablet (40 mg total) by mouth 2 (two) times daily before a meal. 60 tablet 5   spironolactone (ALDACTONE) 25 MG tablet Take 12.5 mg by mouth daily.     sucralfate (CARAFATE) 1 GM/10ML suspension Take 10 mLs (1 g total) by mouth every 6 (six) hours as needed (reflux). 420 mL 1   chlorhexidine (PERIDEX) 0.12 % solution 15 mLs by Mouth Rinse route 2 (two) times daily as needed (sore mouth). (Patient not taking: Reported on 08/08/2022) 120 mL 11   predniSONE (DELTASONE) 10 MG tablet Take 3 tablets (30 mg total) by mouth daily with breakfast. (Patient not taking: Reported on 08/08/2022) 15 tablet 0   Pyridoxine HCl (VITAMIN B6 PO) Take 1 tablet by mouth as needed. Unsure of dose (Patient not taking: Reported on 08/08/2022)     No facility-administered medications prior to visit.    Review of Systems  Constitutional:  Negative for chills, fever, malaise/fatigue and weight loss.  HENT:  Negative for congestion, sinus pain and sore throat.   Eyes: Negative.   Respiratory:  Positive for shortness of breath. Negative for cough, hemoptysis, sputum production and wheezing.   Cardiovascular:  Negative for chest pain, palpitations, orthopnea, claudication and leg swelling.  Gastrointestinal:  Negative for abdominal pain, heartburn, nausea and vomiting.  Genitourinary: Negative.   Musculoskeletal:  Negative for joint pain and myalgias.  Skin:  Negative for rash.  Neurological:  Negative for weakness.  Endo/Heme/Allergies: Negative.   Psychiatric/Behavioral: Negative.      Objective:   Vitals:   09/04/22 1505  BP: 116/68  Pulse: 87  SpO2: 100%  Weight: 109 lb 3.2 oz (49.5 kg)  Height: _0  (1.6 m)  Physical Exam Constitutional:      General: She is not in acute distress.    Appearance: She is not ill-appearing.  HENT:     Head: Normocephalic and atraumatic.  Eyes:     General: No scleral icterus.     Conjunctiva/sclera: Conjunctivae normal.  Cardiovascular:     Rate and Rhythm: Normal rate and regular rhythm.     Pulses: Normal pulses.     Heart sounds: Murmur (systolic) heard.  Pulmonary:     Effort: Pulmonary effort is normal.     Breath sounds: Rales (bibasilar) present. No wheezing or rhonchi.  Musculoskeletal:     Right lower leg: No edema.     Left lower leg: No edema.  Lymphadenopathy:     Cervical: No cervical adenopathy.  Skin:    General: Skin is warm and dry.  Neurological:     General: No focal deficit present.     Mental Status: She is alert.  Psychiatric:        Mood and Affect: Mood normal.        Behavior: Behavior normal.        Thought Content: Thought content normal.        Judgment: Judgment normal.     CBC    Component Value Date/Time   WBC 6.1 08/15/2022 1032   RBC 2.99 (L) 08/15/2022 1032   HGB 10.4 (L) 08/15/2022 1032   HGB 11.2 04/21/2022 0940   HCT 31.3 (L) 08/15/2022 1032   HCT 32.7 (L) 04/21/2022 0940   PLT 162.0 08/15/2022 1032   PLT 242 04/21/2022 0940   MCV 105.0 (H) 08/15/2022 1032   MCV 104 (H) 04/21/2022 0940   MCH 34.5 (H) 07/09/2022 1659   MCHC 33.3 08/15/2022 1032   RDW 14.8 08/15/2022 1032   RDW 13.1 04/21/2022 0940   LYMPHSABS 1.3 07/09/2022 1659   MONOABS 0.6 07/09/2022 1659   EOSABS 0.1 07/09/2022 1659   BASOSABS 0.0 07/09/2022 1659   Chest imaging: CT Chest 04/23/22 1. Marked interval improvement in the patchy and confluent areas of ground-glass opacity seen previously with resolution in some regions. 2. Dependent consolidative airspace disease seen previously in the lower lobes has largely resolved in the interval. 3. Subpleural reticulation bilaterally is similar, compatible with underlying chronic interstitial lung disease. 4. Similar moderate to large pericardial effusion. 5. Enlargement of the pulmonary outflow tract/main pulmonary arteries suggests pulmonary arterial hypertension. 6. 7 mm right upper lobe  pulmonary nodule is stable back to 03/30/2021. 7. Similar left-sided pleural effusion. 8. Aortic Atherosclerosis   CTA Chest 03/17/22 1. No evidence for pulmonary embolism. 2. Large pericardial effusion. 3. Mild cardiomegaly. 4. Small bilateral pleural effusions. 5. Bilateral lower lobe airspace disease compatible with infection. 6. Ground-glass opacities in both lungs favored as edema, although infection is not excluded. Some ground-glass areas are nodular. 7. A follow-up chest CT is recommended in 3 months to re-evaluate and confirm resolution.  CT Chest 03/30/21 Cardiovascular: Large pericardial effusion. Cardiomegaly. Atherosclerotic calcifications of the aorta. Aortic valve calcifications.   Mediastinum/Nodes: Thyroid is unremarkable. No axillary adenopathy. Status post LEFT axillary node sampling. No definitive mediastinal adenopathy.   Lungs/Pleura: Small to moderate LEFT pleural effusion. Trace RIGHT pleural effusion. Subpleural reticulation bilaterally. Anteriorly located subpleural reticulation of the LEFT breast likely reflecting sequela of radiation related changes. Irregular RIGHT apical pulmonary nodule versus scar measures 6 x 3 mm (series 7, image 27). Several adjacent RIGHT middle lobe pulmonary nodules each measure approximately 4 mm (series 7, image  104). LEFT lower lobe consolidative opacity, nonspecific. RIGHT basilar linear opacity most consistent with atelectasis.  HRCT Chest 04/12/2019 1. Findings suggestive of a probable UIP pattern fibrosis, which appears  similar to slightly progressed since 2012 and is likely related to the  patient's known underlying connective tissue disorder.  2. Small pulmonary nodules measuring up to 6 mm. Recommend 12 month follow  up chest CT to assess stability, per 2017 Fleischner criteria.  3, Large pericardial effusion. This finding is known per clinic note dated  12/23/2018 and has been documented on a recent echo.    PFT:    Latest Ref Rng & Units 02/03/2022   11:01 AM  PFT Results  FVC-Pre L 1.34   FVC-Predicted Pre % 56   Pre FEV1/FVC % % 84   FEV1-Pre L 1.12   FEV1-Predicted Pre % 64   DLCO uncorrected ml/min/mmHg 10.32   DLCO UNC% % 57   DLCO corrected ml/min/mmHg 11.49   DLCO COR %Predicted % 63   DLVA Predicted % 119   TLC L 2.88   TLC % Predicted % 58   RV % Predicted % 62     PFT 02/28/21 FEV1/FVC 72%, FEV 1.03 (57%), FVC 1.42L (59%), DLCO 41%  PFT 03/01/2020 FEV1/FVC 73%, FEV1 1.25L (65%), FVC 1.25L (68%), DLCO 37.3%  6MWT 08/2020 Six minute walk distance is within the normal predicted range.  Oxygenation during exercise was adequate (SpO2 =>90%) without the use of supplemental O2. At peak exercise, the heart rate indicated cardiovascular maximums were being approached.  Perceived dyspnea at end of exercise was absent or mild.  Labs:  Path:  Echo 02/28/21: NORMAL LEFT VENTRICULAR SYSTOLIC FUNCTION WITH MILD LVH    NORMAL RIGHT VENTRICULAR SYSTOLIC FUNCTION    VALVULAR REGURGITATION: TRIVIAL MR, MILD TR    VALVULAR STENOSIS: MODERATE AS, MILD MS    LARGE PERICARDIAL EFFUSION (See above)   Heart Catheterization: Cardiac catheterization was 02/07/08. It looked quite good with a PAP of 35/11 (mean 22), mean RAP of 3, PVR = 1.7 wood units, cardiac index = 3.69 L/min/m2, PCWP of 11. Cardiac catheterization in September 2004 prior to therapy revealed an RA pressure was 5, PA pressure 60/15 with a mean of 35, cardiac index 3.2, and PVR 5. The next cath on 10/03/2003, which was performed on iloprost / placebo, revealed a RA pressure of 2, PA pressure of 55/17 with a mean of 32, PCWP of 5, cardiac index of 3.1-3.7 and PVR between 3 and 4.    Assessment & Plan:   ILD (interstitial lung disease) ([place])  Pulmonary hypertension ([place])  Scleroderma ([place])  Discussion: Paula Ward is an 86 year old woman, never smoker with history of pulmonary hypertension, AVMs of small bowel,  chronic blood loss anemia, CKD, scleroderma who returns to pulmonary clinic for ILD.   She will remain on macitentan for her pulmonary hypertension.    She does appear to have progressive fibrotic changes based on her CT chest imaging and she also has a progressive decline in her pulmonary function tests, but after todays PFT testing, they appear stable at this time.  Antifibrotic therapy has been discussed previously at her visits at [facility] and not initiated due to the side effect profile.  She is not currently requiring supplemental oxygen at this time, only at night.  She is to continue ipratropium nasal spray daily for her post nasal drainage and cough.  Follow-up in 6 months.  Freda Jackson, MD [facility] Office:  [phone]   Current Outpatient Medications:    acetaminophen (TYLENOL) 325 MG tablet, Take 2 tablets (650 mg total) by mouth every 6 (six) hours as needed for moderate pain., Disp: 30 tablet, Rfl: 0   albuterol (VENTOLIN HFA) 108 (90 Base) MCG/ACT inhaler, INHALE 2 PUFFS INTO THE LUNGS EVERY 6 HOURS AS NEEDED FOR WHEEZING OR SHORTNESS OF BREATH, Disp: 6.7 g, Rfl: 3   ALPRAZolam (XANAX) 0.25 MG tablet, Take 0.5 tablets (0.125 mg total) by mouth at bedtime as needed., Disp: 30 tablet, Rfl: 3   Cyanocobalamin (VITAMIN B-12 PO), Take 1 tablet by mouth 2 (two) times a week., Disp: , Rfl:    diltiazem (CARDIZEM CD) 120 MG 24 hr capsule, Take 120 mg by mouth in the morning., Disp: , Rfl:    fluticasone (FLONASE) 50 MCG/ACT nasal spray, Place 1 spray into both nostrils daily as needed for allergies., Disp: 48 g, Rfl: 0   furosemide (LASIX) 20 MG tablet, Take 1 tablet by mouth once daily, Disp: 90 tablet, Rfl: 0   guaiFENesin (MUCINEX) 600 MG 12 hr tablet, Take 150 mg by mouth daily., Disp: , Rfl:    ipratropium (ATROVENT) 0.03 % nasal spray, Place 2 sprays into both nostrils every 12 (twelve) hours., Disp: 30 mL, Rfl: 12   magic mouthwash SOLN, Take 10 mLs by  mouth 4 (four) times daily as needed for mouth pain., Disp: 15 mL, Rfl: 2   montelukast (SINGULAIR) 10 MG tablet, TAKE 1 TABLET BY MOUTH AT BEDTIME, Disp: 90 tablet, Rfl: 0   Multiple Vitamins-Iron (MULTIVITAMIN/IRON PO), Take 1 tablet by mouth daily., Disp: , Rfl:    Multiple Vitamins-Minerals (ZINC PO), Take 1 tablet by mouth daily., Disp: , Rfl:    OPSUMIT 10 MG TABS, Take 10 mg by mouth daily., Disp: , Rfl:    OXYGEN, Inhale into the lungs. 2 LMP at night and upon exertion, Disp: , Rfl:    pantoprazole (PROTONIX) 40 MG tablet, Take 1 tablet (40 mg total) by mouth 2 (two) times daily before a meal., Disp: 60 tablet, Rfl: 5   spironolactone (ALDACTONE) 25 MG tablet, Take 12.5 mg by mouth daily., Disp: , Rfl:    sucralfate (CARAFATE) 1 GM/10ML suspension, Take 10 mLs (1 g total) by mouth every 6 (six) hours as needed (reflux)., Disp: 420 mL, Rfl: 1

## 2022-09-04 NOTE — Patient Instructions (Signed)
Continue flonase nasal spray daily, 1 spray per nostril daily   Continue  ipratropium nasal spray, 2 sprays per nostril twice daily   Continue sleeping on wedge pillow and taking pantoprazole twice daily   Continue taking opsumit 12m daily for pulmonary hypertension   Follow up in 6 months

## 2022-09-06 ENCOUNTER — Other Ambulatory Visit (HOSPITAL_COMMUNITY): Payer: Self-pay

## 2022-09-09 DIAGNOSIS — L603 Nail dystrophy: Secondary | ICD-10-CM | POA: Diagnosis not present

## 2022-09-09 DIAGNOSIS — M79672 Pain in left foot: Secondary | ICD-10-CM | POA: Diagnosis not present

## 2022-09-09 DIAGNOSIS — L97512 Non-pressure chronic ulcer of other part of right foot with fat layer exposed: Secondary | ICD-10-CM | POA: Diagnosis not present

## 2022-09-09 DIAGNOSIS — I739 Peripheral vascular disease, unspecified: Secondary | ICD-10-CM | POA: Diagnosis not present

## 2022-09-09 DIAGNOSIS — M79671 Pain in right foot: Secondary | ICD-10-CM | POA: Diagnosis not present

## 2022-09-09 DIAGNOSIS — L84 Corns and callosities: Secondary | ICD-10-CM | POA: Diagnosis not present

## 2022-09-12 ENCOUNTER — Encounter (HOSPITAL_COMMUNITY): Payer: Medicare PPO

## 2022-09-12 ENCOUNTER — Encounter: Payer: Self-pay | Admitting: Pulmonary Disease

## 2022-09-16 ENCOUNTER — Encounter: Payer: Self-pay | Admitting: Internal Medicine

## 2022-09-16 NOTE — Progress Notes (Signed)
Subjective:    Patient ID: Paula Ward, female    DOB: Feb 17, 1936, 86 y.o.   MRN: [id number]      HPI Paula Ward is here for  Chief Complaint  Patient presents with   Nasal drainage    Nasal drainage and water eyes     Neck pain - tightness on right side of the neck up toward the ear.  This has been going on for a while.  Increased pain with movement.  Has tried topical emu and heat.  They both provide relief, but it does not last long.  No radiation to arm, no N/T.  She denies headaches.  Her right ear feels plugged.  She does have some decreased hearing.  She denies any ear pain or ear discharge.  She uses flonase twice a day and take mucinex daily for her hoarseness but it does not.      Medications and allergies reviewed with patient and updated if appropriate.  Current Outpatient Medications on File Prior to Visit  Medication Sig Dispense Refill   acetaminophen (TYLENOL) 325 MG tablet Take 2 tablets (650 mg total) by mouth every 6 (six) hours as needed for moderate pain. 30 tablet 0   albuterol (VENTOLIN HFA) 108 (90 Base) MCG/ACT inhaler INHALE 2 PUFFS INTO THE LUNGS EVERY 6 HOURS AS NEEDED FOR WHEEZING OR SHORTNESS OF BREATH 6.7 g 3   ALPRAZolam (XANAX) 0.25 MG tablet Take 0.5 tablets (0.125 mg total) by mouth at bedtime as needed. 30 tablet 3   Cyanocobalamin (VITAMIN B-12 PO) Take 1 tablet by mouth 2 (two) times a week.     diltiazem (CARDIZEM CD) 120 MG 24 hr capsule Take 120 mg by mouth in the morning.     fluticasone (FLONASE) 50 MCG/ACT nasal spray Place 1 spray into both nostrils daily as needed for allergies. 48 g 0   furosemide (LASIX) 20 MG tablet Take 1 tablet by mouth once daily 90 tablet 0   guaiFENesin (MUCINEX) 600 MG 12 hr tablet Take 150 mg by mouth daily.     ipratropium (ATROVENT) 0.03 % nasal spray Place 2 sprays into both nostrils every 12 (twelve) hours. 30 mL 12   magic mouthwash SOLN Take 10 mLs by mouth 4 (four) times daily as needed for mouth  pain. 15 mL 2   montelukast (SINGULAIR) 10 MG tablet TAKE 1 TABLET BY MOUTH AT BEDTIME 90 tablet 0   Multiple Vitamins-Iron (MULTIVITAMIN/IRON PO) Take 1 tablet by mouth daily.     Multiple Vitamins-Minerals (ZINC PO) Take 1 tablet by mouth daily.     OPSUMIT 10 MG TABS Take 10 mg by mouth daily.     OXYGEN Inhale into the lungs. 2 LMP at night and upon exertion     pantoprazole (PROTONIX) 40 MG tablet Take 1 tablet (40 mg total) by mouth 2 (two) times daily before a meal. 60 tablet 5   spironolactone (ALDACTONE) 25 MG tablet Take 12.5 mg by mouth daily.     sucralfate (CARAFATE) 1 GM/10ML suspension Take 10 mLs (1 g total) by mouth every 6 (six) hours as needed (reflux). 420 mL 1   No current facility-administered medications on file prior to visit.    Review of Systems  Constitutional:  Negative for fever.  HENT:  Positive for hearing loss (right ear - feels clogged). Negative for congestion, ear discharge, ear pain, postnasal drip, sinus pressure, sinus pain and tinnitus.   Musculoskeletal:  Positive for neck pain and neck stiffness.  Neurological:  Negative for dizziness, light-headedness and headaches.       Objective:   Vitals:   09/17/22 0832  BP: (!) 100/58  Pulse: 75  Temp: 97.9 F (36.6 C)  SpO2: 96%   BP Readings from Last 3 Encounters:  09/17/22 (!) 100/58  09/04/22 116/68  08/15/22 (!) 134/39   Wt Readings from Last 3 Encounters:  09/17/22 106 lb 6.4 oz (48.3 kg)  09/04/22 109 lb 3.2 oz (49.5 kg)  08/11/22 108 lb 12.8 oz (49.4 kg)   Body mass index is 18.85 kg/m.    Physical Exam Constitutional:      General: She is not in acute distress.    Appearance: Normal appearance. She is not ill-appearing.  HENT:     Head: Normocephalic and atraumatic.     Right Ear: Tympanic membrane, ear canal and external ear normal. There is no impacted cerumen.     Left Ear: Tympanic membrane, ear canal and external ear normal. There is no impacted cerumen.      Mouth/Throat:     Mouth: Mucous membranes are moist.     Pharynx: No oropharyngeal exudate or posterior oropharyngeal erythema.  Eyes:     Conjunctiva/sclera: Conjunctivae normal.  Cardiovascular:     Rate and Rhythm: Normal rate and regular rhythm.  Pulmonary:     Effort: Pulmonary effort is normal. No respiratory distress.     Breath sounds: Normal breath sounds. No wheezing or rales.  Musculoskeletal:        General: Tenderness (Tenderness with palpation right side of neck.  No cervical spine tenderness.  Increased pain with movement of head) present.     Cervical back: Neck supple. No tenderness.  Lymphadenopathy:     Cervical: No cervical adenopathy.  Skin:    General: Skin is warm and dry.  Neurological:     Mental Status: She is alert.     Sensory: No sensory deficit.     Motor: No weakness.            Assessment & Plan:    Musculoskeletal neck pain: Subacute Has been going on for a while Sounds to be muscular in nature, could possibly related to some arthritis in the neck Concerned that she would not do well with the muscle relaxer and she would prefer to avoid Continue heat, topical over-the-counter medications Prednisone 20 mg daily x 5 days Advised physical therapy-she deferred for now, but will let me know if she changes her mind  Decreased hearing right ear, ear feels clogged: Acute She does have some mild cold symptoms that sound more like allergies Ear exam is normal-no erythema in ear canal and tympanic membrane No wax ?  Eustachian tube dysfunction Continue Flonase twice daily, Mucinex Prednisone 40 mg daily x 5 days  Call if no improvement

## 2022-09-17 ENCOUNTER — Other Ambulatory Visit (INDEPENDENT_AMBULATORY_CARE_PROVIDER_SITE_OTHER): Payer: Medicare PPO

## 2022-09-17 ENCOUNTER — Ambulatory Visit: Payer: Medicare PPO | Admitting: Internal Medicine

## 2022-09-17 VITALS — BP 100/58 | HR 75 | Temp 97.9°F | Ht 63.0 in | Wt 106.4 lb

## 2022-09-17 DIAGNOSIS — I272 Pulmonary hypertension, unspecified: Secondary | ICD-10-CM

## 2022-09-17 DIAGNOSIS — H9191 Unspecified hearing loss, right ear: Secondary | ICD-10-CM | POA: Diagnosis not present

## 2022-09-17 DIAGNOSIS — M542 Cervicalgia: Secondary | ICD-10-CM | POA: Diagnosis not present

## 2022-09-17 LAB — COMPREHENSIVE METABOLIC PANEL
ALT: 10 U/L (ref 0–35)
AST: 20 U/L (ref 0–37)
Albumin: 4.2 g/dL (ref 3.5–5.2)
Alkaline Phosphatase: 51 U/L (ref 39–117)
BUN: 42 mg/dL — ABNORMAL HIGH (ref 6–23)
CO2: 30 mEq/L (ref 19–32)
Calcium: 9.3 mg/dL (ref 8.4–10.5)
Chloride: 103 mEq/L (ref 96–112)
Creatinine, Ser: 1.14 mg/dL (ref 0.40–1.20)
GFR: 44.34 mL/min — ABNORMAL LOW (ref >60.00)
Glucose, Bld: 86 mg/dL (ref 70–99)
Potassium: 4.2 mEq/L (ref 3.5–5.1)
Sodium: 140 mEq/L (ref 135–145)
Total Bilirubin: 0.6 mg/dL (ref 0.2–1.2)
Total Protein: 6.6 g/dL (ref 6.0–8.3)

## 2022-09-17 LAB — CBC
HCT: 32.1 % — ABNORMAL LOW (ref 36.0–46.0)
Hemoglobin: 11 g/dL — ABNORMAL LOW (ref 12.0–15.0)
MCHC: 34.3 g/dL (ref 30.0–36.0)
MCV: 105.4 fl — ABNORMAL HIGH (ref 78.0–100.0)
Platelets: 157 10*3/uL (ref 150.0–400.0)
RBC: 3.05 Mil/uL — ABNORMAL LOW (ref 3.87–5.11)
RDW: 13.8 % (ref 11.5–15.5)
WBC: 5.1 10*3/uL (ref 4.0–10.5)

## 2022-09-17 MED ORDER — PREDNISONE 20 MG PO TABS: 40.0000 mg | ORAL_TABLET | Freq: Every day | ORAL | 0 refills | Status: DC

## 2022-09-17 NOTE — Patient Instructions (Addendum)
Medications changes include :   prednisone 40 mg daily x 5 days   Let me know if your symptoms are not improving   Return if symptoms worsen or fail to improve.

## 2022-09-19 ENCOUNTER — Encounter (HOSPITAL_COMMUNITY)
Admission: RE | Admit: 2022-09-19 | Discharge: 2022-09-19 | Disposition: A | Discharge status: Home / Self Care | Payer: Medicare PPO | Source: Ambulatory Visit | Attending: Nephrology | Admitting: Nephrology

## 2022-09-19 VITALS — BP 135/53 | HR 77 | Temp 97.2°F | Resp 18

## 2022-09-19 DIAGNOSIS — D638 Anemia in other chronic diseases classified elsewhere: Secondary | ICD-10-CM

## 2022-09-19 DIAGNOSIS — N1832 Chronic kidney disease, stage 3b: Secondary | ICD-10-CM | POA: Diagnosis not present

## 2022-09-19 LAB — POCT HEMOGLOBIN-HEMACUE: Hemoglobin: 10.4 g/dL — ABNORMAL LOW (ref 12.0–15.0)

## 2022-09-19 LAB — IRON AND TIBC
Iron: 180 ug/dL — ABNORMAL HIGH (ref 28–170)
Saturation Ratios: 86 % — ABNORMAL HIGH (ref 10.4–31.8)
TIBC: 209 ug/dL — ABNORMAL LOW (ref 250–450)
UIBC: 29 ug/dL

## 2022-09-19 LAB — FERRITIN: Ferritin: 1239 ng/mL — ABNORMAL HIGH (ref 11–307)

## 2022-09-19 MED ORDER — EPOETIN ALFA-EPBX 10000 UNIT/ML IJ SOLN
INTRAMUSCULAR | Status: AC
  Administered 2022-09-19: 10000 [IU] via SUBCUTANEOUS
  Filled 2022-09-19: qty 1

## 2022-09-19 MED ORDER — EPOETIN ALFA-EPBX 10000 UNIT/ML IJ SOLN: 10000.0000 [IU] | INTRAMUSCULAR | Status: DC

## 2022-09-22 DIAGNOSIS — Z961 Presence of intraocular lens: Secondary | ICD-10-CM | POA: Diagnosis not present

## 2022-09-22 DIAGNOSIS — H401432 Capsular glaucoma with pseudoexfoliation of lens, bilateral, moderate stage: Secondary | ICD-10-CM | POA: Diagnosis not present

## 2022-09-22 DIAGNOSIS — H52203 Unspecified astigmatism, bilateral: Secondary | ICD-10-CM | POA: Diagnosis not present

## 2022-09-24 ENCOUNTER — Encounter (HOSPITAL_COMMUNITY): Payer: Self-pay

## 2022-09-24 ENCOUNTER — Encounter: Payer: Self-pay | Admitting: Gastroenterology

## 2022-09-24 ENCOUNTER — Other Ambulatory Visit (HOSPITAL_BASED_OUTPATIENT_CLINIC_OR_DEPARTMENT_OTHER): Payer: Self-pay

## 2022-09-24 MED ORDER — AREXVY 120 MCG/0.5ML IM SUSR
INTRAMUSCULAR | 0 refills | Status: DC
  Filled 2022-09-24: qty 0.5, 1d supply, fill #0

## 2022-09-28 ENCOUNTER — Other Ambulatory Visit: Payer: Self-pay | Admitting: Internal Medicine

## 2022-09-29 ENCOUNTER — Ambulatory Visit (INDEPENDENT_AMBULATORY_CARE_PROVIDER_SITE_OTHER): Payer: Medicare PPO | Admitting: Internal Medicine

## 2022-09-29 ENCOUNTER — Encounter: Payer: Self-pay | Admitting: Internal Medicine

## 2022-09-29 VITALS — BP 122/60 | HR 80 | Temp 97.8°F | Ht 63.0 in | Wt 108.0 lb

## 2022-09-29 DIAGNOSIS — Z0000 Encounter for general adult medical examination without abnormal findings: Secondary | ICD-10-CM

## 2022-09-29 DIAGNOSIS — N1832 Chronic kidney disease, stage 3b: Secondary | ICD-10-CM | POA: Diagnosis not present

## 2022-09-29 DIAGNOSIS — I272 Pulmonary hypertension, unspecified: Secondary | ICD-10-CM

## 2022-09-29 DIAGNOSIS — J849 Interstitial pulmonary disease, unspecified: Secondary | ICD-10-CM

## 2022-09-29 DIAGNOSIS — F4321 Adjustment disorder with depressed mood: Secondary | ICD-10-CM | POA: Diagnosis not present

## 2022-09-29 DIAGNOSIS — D638 Anemia in other chronic diseases classified elsewhere: Secondary | ICD-10-CM | POA: Diagnosis not present

## 2022-09-29 MED ORDER — CITALOPRAM HYDROBROMIDE 10 MG PO TABS: 10.0000 mg | ORAL_TABLET | Freq: Every day | ORAL | 3 refills | Status: DC

## 2022-09-29 MED ORDER — DILTIAZEM HCL ER COATED BEADS 120 MG PO CP24: 120.0000 mg | ORAL_CAPSULE | Freq: Every morning | ORAL | 3 refills | Status: DC

## 2022-09-29 NOTE — Progress Notes (Unsigned)
   Subjective:   Patient ID: Paula Ward, female    DOB: 15-Jan-1936, 86 y.o.   MRN: [id number]  HPI The patient is here for physical.  PMH, [facility], social history reviewed and updated  Review of Systems  Objective:  Physical Exam  Vitals:   09/29/22 1559  BP: 122/60  Pulse: 80  Temp: 97.8 F (36.6 C)  TempSrc: Oral  SpO2: 95%  Weight: 108 lb (49 kg)  Height: _0  (1.6 m)    Assessment & Plan:

## 2022-09-29 NOTE — Patient Instructions (Signed)
We have sent in celexa to take 1/2 pill daily.

## 2022-10-02 ENCOUNTER — Encounter: Payer: Self-pay | Admitting: Internal Medicine

## 2022-10-02 DIAGNOSIS — F4320 Adjustment disorder, unspecified: Secondary | ICD-10-CM | POA: Insufficient documentation

## 2022-10-02 NOTE — Assessment & Plan Note (Signed)
Monitoring CMP monthly due to high risk medications.

## 2022-10-02 NOTE — Assessment & Plan Note (Signed)
Checking CBC monthly due to several prior life threatening admissions with GI bleeding. No melena currently.

## 2022-10-02 NOTE — Assessment & Plan Note (Signed)
Flu shot yearly. Pneumonia complete. Shingrix due at pharmacy. Tetanus due at pharmacy. Colonoscopy aged out. Mammogram aged out, pap smear aged out and dexa complete. Counseled about sun safety and mole surveillance. Counseled about the dangers of distracted driving. Given 10 year screening recommendations.

## 2022-10-02 NOTE — Assessment & Plan Note (Signed)
Rx celexa 5 mg daily for 1 month then increase to 10 mg daily if needed. Husband is having moderate dementia and this is a new stressor in her life.

## 2022-10-02 NOTE — Assessment & Plan Note (Signed)
Overall stable and following with pulmonary for meds.

## 2022-10-09 DIAGNOSIS — L97412 Non-pressure chronic ulcer of right heel and midfoot with fat layer exposed: Secondary | ICD-10-CM | POA: Diagnosis not present

## 2022-10-10 ENCOUNTER — Encounter (HOSPITAL_COMMUNITY): Payer: Self-pay

## 2022-10-10 ENCOUNTER — Inpatient Hospital Stay (HOSPITAL_COMMUNITY)
Admission: EM | Admit: 2022-10-10 | Discharge: 2022-10-15 | DRG: 481 | Disposition: A | Discharge status: Skilled Nursing Facility | Payer: Medicare PPO | Attending: Internal Medicine | Admitting: Internal Medicine

## 2022-10-10 ENCOUNTER — Emergency Department (HOSPITAL_COMMUNITY): Payer: Medicare PPO

## 2022-10-10 ENCOUNTER — Other Ambulatory Visit: Payer: Self-pay

## 2022-10-10 DIAGNOSIS — J449 Chronic obstructive pulmonary disease, unspecified: Secondary | ICD-10-CM | POA: Diagnosis present

## 2022-10-10 DIAGNOSIS — Z882 Allergy status to sulfonamides status: Secondary | ICD-10-CM

## 2022-10-10 DIAGNOSIS — M160 Bilateral primary osteoarthritis of hip: Secondary | ICD-10-CM | POA: Diagnosis not present

## 2022-10-10 DIAGNOSIS — M1990 Unspecified osteoarthritis, unspecified site: Secondary | ICD-10-CM | POA: Diagnosis not present

## 2022-10-10 DIAGNOSIS — Z885 Allergy status to narcotic agent status: Secondary | ICD-10-CM

## 2022-10-10 DIAGNOSIS — I272 Pulmonary hypertension, unspecified: Secondary | ICD-10-CM | POA: Diagnosis present

## 2022-10-10 DIAGNOSIS — I130 Hypertensive heart and chronic kidney disease with heart failure and stage 1 through stage 4 chronic kidney disease, or unspecified chronic kidney disease: Secondary | ICD-10-CM | POA: Diagnosis present

## 2022-10-10 DIAGNOSIS — Z8052 Family history of malignant neoplasm of bladder: Secondary | ICD-10-CM | POA: Diagnosis not present

## 2022-10-10 DIAGNOSIS — Z8042 Family history of malignant neoplasm of prostate: Secondary | ICD-10-CM | POA: Diagnosis not present

## 2022-10-10 DIAGNOSIS — D631 Anemia in chronic kidney disease: Secondary | ICD-10-CM | POA: Diagnosis present

## 2022-10-10 DIAGNOSIS — K219 Gastro-esophageal reflux disease without esophagitis: Secondary | ICD-10-CM | POA: Diagnosis present

## 2022-10-10 DIAGNOSIS — Z9841 Cataract extraction status, right eye: Secondary | ICD-10-CM

## 2022-10-10 DIAGNOSIS — Z79899 Other long term (current) drug therapy: Secondary | ICD-10-CM

## 2022-10-10 DIAGNOSIS — N189 Chronic kidney disease, unspecified: Secondary | ICD-10-CM | POA: Diagnosis not present

## 2022-10-10 DIAGNOSIS — I129 Hypertensive chronic kidney disease with stage 1 through stage 4 chronic kidney disease, or unspecified chronic kidney disease: Secondary | ICD-10-CM | POA: Diagnosis not present

## 2022-10-10 DIAGNOSIS — Z800 Family history of malignant neoplasm of digestive organs: Secondary | ICD-10-CM | POA: Diagnosis not present

## 2022-10-10 DIAGNOSIS — F4320 Adjustment disorder, unspecified: Secondary | ICD-10-CM | POA: Diagnosis present

## 2022-10-10 DIAGNOSIS — G8911 Acute pain due to trauma: Secondary | ICD-10-CM | POA: Diagnosis not present

## 2022-10-10 DIAGNOSIS — I5032 Chronic diastolic (congestive) heart failure: Secondary | ICD-10-CM | POA: Diagnosis present

## 2022-10-10 DIAGNOSIS — Z833 Family history of diabetes mellitus: Secondary | ICD-10-CM

## 2022-10-10 DIAGNOSIS — Z820 Family history of epilepsy and other diseases of the nervous system: Secondary | ICD-10-CM | POA: Diagnosis not present

## 2022-10-10 DIAGNOSIS — R278 Other lack of coordination: Secondary | ICD-10-CM | POA: Diagnosis not present

## 2022-10-10 DIAGNOSIS — J849 Interstitial pulmonary disease, unspecified: Secondary | ICD-10-CM | POA: Diagnosis not present

## 2022-10-10 DIAGNOSIS — S72002A Fracture of unspecified part of neck of left femur, initial encounter for closed fracture: Secondary | ICD-10-CM | POA: Diagnosis not present

## 2022-10-10 DIAGNOSIS — J309 Allergic rhinitis, unspecified: Secondary | ICD-10-CM | POA: Diagnosis not present

## 2022-10-10 DIAGNOSIS — Z85528 Personal history of other malignant neoplasm of kidney: Secondary | ICD-10-CM

## 2022-10-10 DIAGNOSIS — S299XXA Unspecified injury of thorax, initial encounter: Secondary | ICD-10-CM | POA: Diagnosis not present

## 2022-10-10 DIAGNOSIS — Z91041 Radiographic dye allergy status: Secondary | ICD-10-CM

## 2022-10-10 DIAGNOSIS — Z9981 Dependence on supplemental oxygen: Secondary | ICD-10-CM

## 2022-10-10 DIAGNOSIS — I10 Essential (primary) hypertension: Secondary | ICD-10-CM | POA: Diagnosis not present

## 2022-10-10 DIAGNOSIS — R2689 Other abnormalities of gait and mobility: Secondary | ICD-10-CM | POA: Diagnosis not present

## 2022-10-10 DIAGNOSIS — M1712 Unilateral primary osteoarthritis, left knee: Secondary | ICD-10-CM | POA: Diagnosis present

## 2022-10-10 DIAGNOSIS — N183 Chronic kidney disease, stage 3 unspecified: Secondary | ICD-10-CM | POA: Diagnosis present

## 2022-10-10 DIAGNOSIS — I350 Nonrheumatic aortic (valve) stenosis: Secondary | ICD-10-CM | POA: Diagnosis not present

## 2022-10-10 DIAGNOSIS — Z66 Do not resuscitate: Secondary | ICD-10-CM | POA: Diagnosis not present

## 2022-10-10 DIAGNOSIS — D7589 Other specified diseases of blood and blood-forming organs: Secondary | ICD-10-CM | POA: Diagnosis present

## 2022-10-10 DIAGNOSIS — Z86718 Personal history of other venous thrombosis and embolism: Secondary | ICD-10-CM

## 2022-10-10 DIAGNOSIS — Z801 Family history of malignant neoplasm of trachea, bronchus and lung: Secondary | ICD-10-CM

## 2022-10-10 DIAGNOSIS — W010XXA Fall on same level from slipping, tripping and stumbling without subsequent striking against object, initial encounter: Secondary | ICD-10-CM | POA: Diagnosis present

## 2022-10-10 DIAGNOSIS — N1832 Chronic kidney disease, stage 3b: Secondary | ICD-10-CM | POA: Diagnosis present

## 2022-10-10 DIAGNOSIS — I5033 Acute on chronic diastolic (congestive) heart failure: Secondary | ICD-10-CM | POA: Diagnosis present

## 2022-10-10 DIAGNOSIS — D638 Anemia in other chronic diseases classified elsewhere: Secondary | ICD-10-CM | POA: Diagnosis present

## 2022-10-10 DIAGNOSIS — M349 Systemic sclerosis, unspecified: Secondary | ICD-10-CM | POA: Diagnosis present

## 2022-10-10 DIAGNOSIS — S72002D Fracture of unspecified part of neck of left femur, subsequent encounter for closed fracture with routine healing: Secondary | ICD-10-CM | POA: Diagnosis not present

## 2022-10-10 DIAGNOSIS — Z9842 Cataract extraction status, left eye: Secondary | ICD-10-CM

## 2022-10-10 DIAGNOSIS — I2729 Other secondary pulmonary hypertension: Secondary | ICD-10-CM | POA: Diagnosis present

## 2022-10-10 DIAGNOSIS — Z888 Allergy status to other drugs, medicaments and biological substances status: Secondary | ICD-10-CM

## 2022-10-10 DIAGNOSIS — D62 Acute posthemorrhagic anemia: Secondary | ICD-10-CM | POA: Diagnosis not present

## 2022-10-10 DIAGNOSIS — I3139 Other pericardial effusion (noninflammatory): Secondary | ICD-10-CM | POA: Diagnosis present

## 2022-10-10 DIAGNOSIS — S72142A Displaced intertrochanteric fracture of left femur, initial encounter for closed fracture: Principal | ICD-10-CM

## 2022-10-10 DIAGNOSIS — Z7401 Bed confinement status: Secondary | ICD-10-CM | POA: Diagnosis not present

## 2022-10-10 DIAGNOSIS — M341 CR(E)ST syndrome: Secondary | ICD-10-CM | POA: Diagnosis present

## 2022-10-10 DIAGNOSIS — S72142D Displaced intertrochanteric fracture of left femur, subsequent encounter for closed fracture with routine healing: Secondary | ICD-10-CM | POA: Diagnosis not present

## 2022-10-10 DIAGNOSIS — M549 Dorsalgia, unspecified: Secondary | ICD-10-CM | POA: Diagnosis not present

## 2022-10-10 DIAGNOSIS — R54 Age-related physical debility: Secondary | ICD-10-CM | POA: Diagnosis present

## 2022-10-10 DIAGNOSIS — Z86711 Personal history of pulmonary embolism: Secondary | ICD-10-CM | POA: Diagnosis not present

## 2022-10-10 DIAGNOSIS — J9611 Chronic respiratory failure with hypoxia: Secondary | ICD-10-CM | POA: Diagnosis not present

## 2022-10-10 DIAGNOSIS — Z86010 Personal history of colonic polyps: Secondary | ICD-10-CM

## 2022-10-10 DIAGNOSIS — Z881 Allergy status to other antibiotic agents status: Secondary | ICD-10-CM

## 2022-10-10 DIAGNOSIS — Z853 Personal history of malignant neoplasm of breast: Secondary | ICD-10-CM

## 2022-10-10 DIAGNOSIS — W19XXXA Unspecified fall, initial encounter: Secondary | ICD-10-CM | POA: Diagnosis not present

## 2022-10-10 DIAGNOSIS — M6281 Muscle weakness (generalized): Secondary | ICD-10-CM | POA: Diagnosis not present

## 2022-10-10 LAB — COMPREHENSIVE METABOLIC PANEL
ALT: 14 U/L (ref 0–44)
AST: 24 U/L (ref 15–41)
Albumin: 4 g/dL (ref 3.5–5.0)
Alkaline Phosphatase: 54 U/L (ref 38–126)
Anion gap: 9 (ref 5–15)
BUN: 37 mg/dL — ABNORMAL HIGH (ref 8–23)
CO2: 26 mmol/L (ref 22–32)
Calcium: 9 mg/dL (ref 8.9–10.3)
Chloride: 102 mmol/L (ref 98–111)
Creatinine, Ser: 1.38 mg/dL — ABNORMAL HIGH (ref 0.44–1.00)
GFR, Estimated: 38 mL/min — ABNORMAL LOW (ref >60)
Glucose, Bld: 148 mg/dL — ABNORMAL HIGH (ref 70–99)
Potassium: 3.9 mmol/L (ref 3.5–5.1)
Sodium: 137 mmol/L (ref 135–145)
Total Bilirubin: 0.8 mg/dL (ref 0.3–1.2)
Total Protein: 6.6 g/dL (ref 6.5–8.1)

## 2022-10-10 LAB — CBC WITH DIFFERENTIAL/PLATELET
Abs Immature Granulocytes: 0.03 10*3/uL (ref 0.00–0.07)
Basophils Absolute: 0 10*3/uL (ref 0.0–0.1)
Basophils Relative: 1 %
Eosinophils Absolute: 0 10*3/uL (ref 0.0–0.5)
Eosinophils Relative: 0 %
HCT: 31.7 % — ABNORMAL LOW (ref 36.0–46.0)
Hemoglobin: 10.1 g/dL — ABNORMAL LOW (ref 12.0–15.0)
Immature Granulocytes: 0 %
Lymphocytes Relative: 12 %
Lymphs Abs: 0.8 10*3/uL (ref 0.7–4.0)
MCH: 35.8 pg — ABNORMAL HIGH (ref 26.0–34.0)
MCHC: 31.9 g/dL (ref 30.0–36.0)
MCV: 112.4 fL — ABNORMAL HIGH (ref 80.0–100.0)
Monocytes Absolute: 0.3 10*3/uL (ref 0.1–1.0)
Monocytes Relative: 4 %
Neutro Abs: 5.8 10*3/uL (ref 1.7–7.7)
Neutrophils Relative %: 83 %
Platelets: 178 10*3/uL (ref 150–400)
RBC: 2.82 MIL/uL — ABNORMAL LOW (ref 3.87–5.11)
RDW: 13.7 % (ref 11.5–15.5)
WBC: 7 10*3/uL (ref 4.0–10.5)
nRBC: 0 % (ref 0.0–0.2)

## 2022-10-10 LAB — PROTIME-INR
INR: 1.1 (ref 0.8–1.2)
Prothrombin Time: 14.2 seconds (ref 11.4–15.2)

## 2022-10-10 MED ORDER — FUROSEMIDE 20 MG PO TABS
20.0000 mg | ORAL_TABLET | Freq: Every day | ORAL | Status: DC
  Filled 2022-10-10: qty 1

## 2022-10-10 MED ORDER — SPIRONOLACTONE 12.5 MG HALF TABLET
12.5000 mg | ORAL_TABLET | Freq: Every day | ORAL | Status: DC
  Filled 2022-10-10: qty 1

## 2022-10-10 MED ORDER — ONDANSETRON HCL 4 MG/2ML IJ SOLN
4.0000 mg | Freq: Once | INTRAMUSCULAR | Status: AC
  Administered 2022-10-10: 4 mg via INTRAVENOUS
  Filled 2022-10-10: qty 2

## 2022-10-10 MED ORDER — MORPHINE SULFATE (PF) 4 MG/ML IV SOLN
4.0000 mg | Freq: Once | INTRAVENOUS | Status: AC
  Administered 2022-10-10: 4 mg via INTRAVENOUS
  Filled 2022-10-10: qty 1

## 2022-10-10 MED ORDER — METHOCARBAMOL 500 MG PO TABS
500.0000 mg | ORAL_TABLET | Freq: Four times a day (QID) | ORAL | Status: DC | PRN
  Administered 2022-10-11 (×2): 500 mg via ORAL
  Filled 2022-10-10 (×2): qty 1

## 2022-10-10 MED ORDER — MONTELUKAST SODIUM 10 MG PO TABS
10.0000 mg | ORAL_TABLET | Freq: Every day | ORAL | Status: DC
  Administered 2022-10-11 – 2022-10-14 (×5): 10 mg via ORAL
  Filled 2022-10-10 (×5): qty 1

## 2022-10-10 MED ORDER — ALBUTEROL SULFATE HFA 108 (90 BASE) MCG/ACT IN AERS: 1.0000 | INHALATION_SPRAY | Freq: Four times a day (QID) | RESPIRATORY_TRACT | Status: DC | PRN

## 2022-10-10 MED ORDER — PANTOPRAZOLE SODIUM 40 MG PO TBEC
40.0000 mg | DELAYED_RELEASE_TABLET | Freq: Two times a day (BID) | ORAL | Status: DC
  Administered 2022-10-11 – 2022-10-15 (×9): 40 mg via ORAL
  Filled 2022-10-10 (×10): qty 1

## 2022-10-10 MED ORDER — ALBUTEROL SULFATE (2.5 MG/3ML) 0.083% IN NEBU: 2.5000 mg | INHALATION_SOLUTION | Freq: Four times a day (QID) | RESPIRATORY_TRACT | Status: DC | PRN

## 2022-10-10 MED ORDER — MACITENTAN 10 MG PO TABS
10.0000 mg | ORAL_TABLET | Freq: Every day | ORAL | Status: DC
  Administered 2022-10-12 – 2022-10-15 (×4): 10 mg via ORAL
  Filled 2022-10-10 (×5): qty 1

## 2022-10-10 MED ORDER — GUAIFENESIN ER 600 MG PO TB12
600.0000 mg | ORAL_TABLET | Freq: Every day | ORAL | Status: DC
  Administered 2022-10-12 – 2022-10-15 (×4): 600 mg via ORAL
  Filled 2022-10-10 (×5): qty 1

## 2022-10-10 MED ORDER — METHOCARBAMOL 1000 MG/10ML IJ SOLN: 500.0000 mg | Freq: Four times a day (QID) | INTRAVENOUS | Status: DC | PRN

## 2022-10-10 MED ORDER — HYDROCODONE-ACETAMINOPHEN 5-325 MG PO TABS
1.0000 | ORAL_TABLET | Freq: Four times a day (QID) | ORAL | Status: DC | PRN
  Administered 2022-10-11: 2 via ORAL
  Filled 2022-10-10: qty 2

## 2022-10-10 MED ORDER — CITALOPRAM HYDROBROMIDE 10 MG PO TABS
5.0000 mg | ORAL_TABLET | Freq: Every day | ORAL | Status: DC
  Administered 2022-10-12: 5 mg via ORAL
  Filled 2022-10-10 (×5): qty 1

## 2022-10-10 MED ORDER — LATANOPROST 0.005 % OP SOLN
1.0000 [drp] | Freq: Every day | OPHTHALMIC | Status: DC
  Administered 2022-10-11 – 2022-10-14 (×5): 1 [drp] via OPHTHALMIC
  Filled 2022-10-10: qty 2.5

## 2022-10-10 MED ORDER — FLUTICASONE PROPIONATE 50 MCG/ACT NA SUSP
1.0000 | Freq: Every day | NASAL | Status: DC
  Administered 2022-10-12 – 2022-10-15 (×4): 1 via NASAL
  Filled 2022-10-10: qty 16

## 2022-10-10 MED ORDER — MORPHINE SULFATE (PF) 2 MG/ML IV SOLN
0.5000 mg | INTRAVENOUS | Status: DC | PRN
  Administered 2022-10-10: 0.5 mg via INTRAVENOUS
  Filled 2022-10-10: qty 1

## 2022-10-10 MED ORDER — IPRATROPIUM BROMIDE 0.03 % NA SOLN
2.0000 | Freq: Two times a day (BID) | NASAL | Status: DC
  Administered 2022-10-11 – 2022-10-15 (×8): 2 via NASAL
  Filled 2022-10-10: qty 30

## 2022-10-10 NOTE — Assessment & Plan Note (Signed)
History of IVC placed in 2012

## 2022-10-10 NOTE — Assessment & Plan Note (Signed)
Has been on softer side-low. Unsure if pain medication has contributed.  Med rec not done, will see how her pressures are before adding back her home medication

## 2022-10-10 NOTE — ED Provider Notes (Signed)
[facility] DEPT Provider Note   CSN: [id number] Arrival date & time: 10/10/22  1636     History  Chief Complaint  Patient presents with   Paula Ward   Paula Ward is a 86 y.o. female history of hypertension, here presenting with left hip pain after fall.  Patient states that she was at [facility] and was pulling an item from the shelf and lost her balance and fell onto the left hip.  She denies any head injury or loss of consciousness.  She was unable to get up afterwards.  She was noted to have obvious deformity of the left hip.  The history is provided by the patient.       Home Medications Prior to Admission medications   Medication Sig Start Date End Date Taking? Authorizing Provider  acetaminophen (TYLENOL) 325 MG tablet Take 2 tablets (650 mg total) by mouth every 6 (six) hours as needed for moderate pain. 07/18/22   Jaynee Eagles, PA-C  albuterol (VENTOLIN HFA) 108 (90 Base) MCG/ACT inhaler INHALE 2 PUFFS INTO THE LUNGS EVERY 6 HOURS AS NEEDED FOR WHEEZING OR SHORTNESS OF BREATH 03/06/20   Hoyt Koch, MD  ALPRAZolam Duanne Moron) 0.25 MG tablet Take 0.5 tablets (0.125 mg total) by mouth at bedtime as needed. 10/29/20   Hoyt Koch, MD  citalopram (CELEXA) 10 MG tablet Take 1 tablet (10 mg total) by mouth daily. 09/29/22   Hoyt Koch, MD  Cyanocobalamin (VITAMIN B-12 PO) Take 1 tablet by mouth 2 (two) times a week.    [provider]  diltiazem (CARDIZEM CD) 120 MG 24 hr capsule Take 1 capsule (120 mg total) by mouth in the morning. 09/29/22   Hoyt Koch, MD  fluticasone Asencion Islam) 50 MCG/ACT nasal spray Place 1 spray into both nostrils daily as needed for allergies. 05/15/22   Hoyt Koch, MD  furosemide (LASIX) 20 MG tablet Take 1 tablet by mouth once daily 09/29/22   Hoyt Koch, MD  guaiFENesin (MUCINEX) 600 MG 12 hr tablet Take 150 mg by mouth daily.    [provider]   ipratropium (ATROVENT) 0.03 % nasal spray Place 2 sprays into both nostrils every 12 (twelve) hours. 05/12/22   Freddi Starr, MD  magic mouthwash SOLN Take 10 mLs by mouth 4 (four) times daily as needed for mouth pain. 03/22/22   Shelly Coss, MD  montelukast (SINGULAIR) 10 MG tablet TAKE 1 TABLET BY MOUTH AT BEDTIME 07/16/22   Hoyt Koch, MD  Multiple Vitamins-Iron (MULTIVITAMIN/IRON PO) Take 1 tablet by mouth daily.    [provider]  Multiple Vitamins-Minerals (ZINC PO) Take 1 tablet by mouth daily.    [provider]  OPSUMIT 10 MG TABS Take 10 mg by mouth daily. 08/16/15   [provider]  OXYGEN Inhale into the lungs. 2 LMP at night and upon exertion    [provider]  pantoprazole (PROTONIX) 40 MG tablet Take 1 tablet (40 mg total) by mouth 2 (two) times daily before a meal. 07/22/22   Nandigam, Venia Minks, MD  spironolactone (ALDACTONE) 25 MG tablet Take 12.5 mg by mouth daily. 06/09/20   [provider]  sucralfate (CARAFATE) 1 GM/10ML suspension Take 10 mLs (1 g total) by mouth every 6 (six) hours as needed (reflux). 07/22/22   Mauri Pole, MD      Allergies    Codeine, Other, Erythromycin, Iodinated contrast media, Lisinopril, Mycophenolate mofetil, and Sulfa antibiotics  Review of Systems   Review of Systems  Musculoskeletal:        Left hip pain  All other systems reviewed and are negative.   Physical Exam Updated Vital Signs BP (!) 126/42 (BP Location: Left Arm)   Pulse 82   Temp (!) 97.5 F (36.4 C) (Oral)   Resp 16   SpO2 100%  Physical Exam Vitals and nursing note reviewed.  Constitutional:      Appearance: Normal appearance.     Comments: Uncomfortable   HENT:     Head: Normocephalic.     Comments: No scalp hematoma     Nose: Nose normal.     Mouth/Throat:     Mouth: Mucous membranes are moist.  Eyes:     Extraocular Movements: Extraocular movements intact.     Pupils: Pupils are equal,  round, and reactive to light.  Cardiovascular:     Rate and Rhythm: Normal rate and regular rhythm.     Pulses: Normal pulses.     Heart sounds: Normal heart sounds.  Pulmonary:     Effort: Pulmonary effort is normal.     Breath sounds: Normal breath sounds.  Abdominal:     General: Abdomen is flat.     Palpations: Abdomen is soft.  Musculoskeletal:     Cervical back: Normal range of motion and neck supple.     Comments: Tenderness over the left hip and proximal femur.  Patient unable to range the hip.  Patient has obvious internal rotation of the hip.  No knee or tib-fib tenderness.  Patient able to wiggle toes.  Neurological:     General: No focal deficit present.     Mental Status: She is alert.  Psychiatric:        Mood and Affect: Mood normal.        Behavior: Behavior normal.     ED Results / Procedures / Treatments   Labs (all labs ordered are listed, but only abnormal results are displayed) Labs Reviewed  CBC WITH DIFFERENTIAL/PLATELET  COMPREHENSIVE METABOLIC PANEL  PROTIME-INR    EKG None  Radiology No results found.  Procedures Procedures    Medications Ordered in ED Medications  morphine (PF) 4 MG/ML injection 4 mg (has no administration in time range)  ondansetron (ZOFRAN) injection 4 mg (has no administration in time range)    ED Course/ Medical Decision Making/ A&P                           Medical Decision Making MARIESHA VENTURELLA is a 86 y.o. female here presenting with left hip pain after a fall.  Patient is not on blood thinners.  Patient did not have any head injury.  Concern for possible hip fracture.  Plan to get chest and pelvis x-ray and left femur x-ray.  Will get preop labs and give pain medicine  7:54 PM X-ray showed intertrochanteric fracture.  Labs unremarkable.  I discussed case with Dr. Rolena Infante from Ortho.  He states that patient can be n.p.o. after midnight and patient will need surgery in the morning.  Recommend hospitalist  admission.  Amount and/or Complexity of Data Reviewed Labs: ordered. Decision-making details documented in ED Course. Radiology: ordered and independent interpretation performed. Decision-making details documented in ED Course. ECG/medicine tests: ordered and independent interpretation performed. Decision-making details documented in ED Course.  Risk Prescription drug management. Decision regarding hospitalization.    Final Clinical Impression(s) / ED Diagnoses Final diagnoses:  None    Rx / DC Orders ED Discharge Orders     None         Drenda Freeze, MD 10/10/22 [phone]

## 2022-10-10 NOTE — Assessment & Plan Note (Addendum)
On 2L oxygen at night only secondary to ILD  stable

## 2022-10-10 NOTE — Assessment & Plan Note (Signed)
Recently started on celexa 31m, but the 125mpill was too small to cut in half and the full pill made her sleepy in the afternoon

## 2022-10-10 NOTE — Assessment & Plan Note (Signed)
IV iron infusions q 6 weeks  Iron studies done 09/2022  Hgb stable at baseline

## 2022-10-10 NOTE — ED Notes (Signed)
Paula Ward [phone], patients husband number.

## 2022-10-10 NOTE — H&P (Signed)
History and Physical    Patient: Paula Ward DOB: 22-Oct-1935 DOA: 10/10/2022 DOS: the patient was seen and examined on 10/10/2022 PCP: Hoyt Koch, MD  Patient coming from: [facility] - lives with her husband, uses walker when out of the house.    Chief Complaint: left hip pain s/p fall   HPI: Paula Ward is a 86 y.o. female with medical history significant of AMV, CKD, CREST syndrome, GERD, ILD, hx of PE s/p IVC, pulmonary HTN, chronic respiratory failure with 2L oxygen at night, hx of RCC who presented to ED after she fell and ahd left hip pain.  She was at [facility] and was pulling an item from a shelf and someone bumped into her, lost her balance and fell onto her left hip.  She could not get up. Denies any trauma to her head.  She is on no blood thinner or ASA.   She has been feeling good. Denies any fever/chills, vision changes/headaches, chest pain or palpitations, shortness of breath or cough, abdominal pain, N/V/D, dysuria or leg swelling.    She does not smoke or drink alcohol.   ER Course:  vitals: afebrile, bp: 134/49, HR: 78, RR: 18, oxygen: 100% RA Pertinent labs: hgb: 10.1, MCV: 112, BUN: 37, creatinine: 1.38,  CXR: stable Pelvis xray: comminuted left intertrochanteric fracture In ED: ortho consulted. Morphine and zofran given. TRH asked to admit.    Review of Systems: As mentioned in the history of present illness. All other systems reviewed and are negative. Past Medical History:  Diagnosis Date   Anemia    Angiodysplasia of stomach and duodenum    Arthus phenomenon    AVM (arteriovenous malformation)    Blood transfusion without reported diagnosis    last 4 units 12-22-15, Iron infusion x2 last -01-07-16,01-14-16.   Breast cancer ([place]) 1989   Left   Candida esophagitis (HCC)    Cataract    Chronic kidney disease    Chronic mild renal insuffiency   CREST syndrome (HCC)    GERD (gastroesophageal reflux disease)    w/ HH    Interstitial lung disease (HCC)    Nodule of kidney    Pericardial effusion    PONV (postoperative nausea and vomiting)    Pulmonary embolus ([place]) 2003   Pulmonary hypertension ([place])    followed by Dr Gaynell Face at [facility], now Dr. Maryjean Ka visit end 2'17.   Rectal incontinence    Renal cell carcinoma (HCC)    Scleroderma (HCC)    Tubular adenoma of colon    Uterine polyp    Past Surgical History:  Procedure Laterality Date   APPENDECTOMY  1962   BREAST LUMPECTOMY  1989   left   CATARACT EXTRACTION, BILATERAL Bilateral 12/2013   COLONOSCOPY WITH PROPOFOL N/A 04/15/2021   Procedure: COLONOSCOPY WITH PROPOFOL;  Surgeon: Milus Banister, MD;  Location: [facility];  Service: Endoscopy;  Laterality: N/A;   ENTEROSCOPY N/A 01/18/2016   Procedure: ENTEROSCOPY;  Surgeon: Mauri Pole, MD;  Location: [facility];  Service: Endoscopy;  Laterality: N/A;   ENTEROSCOPY N/A 05/24/2018   Procedure: ENTEROSCOPY;  Surgeon: Jackquline Denmark, MD;  Location: [facility];  Service: Endoscopy;  Laterality: N/A;   ENTEROSCOPY N/A 03/29/2021   Procedure: ENTEROSCOPY;  Surgeon: Jerene Bears, MD;  Location: [facility];  Service: Gastroenterology;  Laterality: N/A;   ENTEROSCOPY N/A 04/13/2021   Procedure: ENTEROSCOPY;  Surgeon: Yetta Flock, MD;  [facility]: [facility];  Service: Gastroenterology;  Laterality: N/A;  ESOPHAGOGASTRODUODENOSCOPY (EGD) WITH PROPOFOL N/A 12/21/2015   Procedure: ESOPHAGOGASTRODUODENOSCOPY (EGD) WITH PROPOFOL;  Surgeon: Irene Shipper, MD;  [facility]: [facility];  Service: Endoscopy;  Laterality: N/A;   HOT HEMOSTASIS N/A 05/24/2018   Procedure: HOT HEMOSTASIS (ARGON PLASMA COAGULATION/BICAP);  Surgeon: Jackquline Denmark, MD;  Location: [facility];  Service: Endoscopy;  Laterality: N/A;   HOT HEMOSTASIS N/A 03/29/2021   Procedure: HOT HEMOSTASIS (ARGON PLASMA COAGULATION/BICAP);  Surgeon: Jerene Bears, MD;  Location: [facility];  Service: Gastroenterology;  Laterality: N/A;    HOT HEMOSTASIS N/A 04/13/2021   Procedure: HOT HEMOSTASIS (ARGON PLASMA COAGULATION/BICAP);  Surgeon: Yetta Flock, MD;  Location: [facility];  Service: Gastroenterology;  Laterality: N/A;   IR GENERIC HISTORICAL  06/05/2016   IR RADIOLOGIST EVAL & MGMT 06/05/2016 Aletta Edouard, MD [facility] INTERV RAD   IVC Filter     KIDNEY SURGERY     left -"laser surgery by Dr. Kathlene Cote- 5 yrs ago" no removal   RIGHT/LEFT HEART CATH AND CORONARY ANGIOGRAPHY N/A 04/25/2022   Procedure: RIGHT/LEFT HEART CATH AND CORONARY ANGIOGRAPHY;  Surgeon: Sherren Mocha, MD;  Location: [facility];  Service: Cardiovascular;  Laterality: N/A;   SCHLEROTHERAPY  05/24/2018   Procedure: Woodward Ku;  Surgeon: Jackquline Denmark, MD;  [facility];  Service: Endoscopy;;   SUBMUCOSAL TATTOO INJECTION  04/13/2021   Procedure: SUBMUCOSAL TATTOO INJECTION;  Surgeon: Yetta Flock, MD;  Location: [facility];  Service: Gastroenterology;;   TONSILLECTOMY     TUBAL LIGATION     Social History:  reports that she has never smoked. She has never used smokeless tobacco. She reports that she does not drink alcohol and does not use drugs.  Allergies  Allergen Reactions   Codeine Nausea Only    Hallucinations, too   Other Nausea And Vomiting    "-mycin" antibiotics.   Also cause hallucinations.   Erythromycin Nausea And Vomiting   Iodinated Contrast Media Other (See Comments)    Renal issues   Lisinopril     Pt doesn't remember    Mycophenolate Mofetil Nausea Only   Sulfa Antibiotics Other (See Comments)    unknown    Family History  Problem Relation Age of Onset   Bladder Cancer Father    Diabetes Father    Prostate cancer Father    Alzheimer's disease Mother    Diabetes Sister    Lung cancer Sister         smoker   Esophageal cancer Paternal Uncle    Colon cancer Neg Hx     Prior to Admission medications   Medication Sig Start Date End Date Taking? Authorizing Provider  acetaminophen  (TYLENOL) 325 MG tablet Take 2 tablets (650 mg total) by mouth every 6 (six) hours as needed for moderate pain. 07/18/22   Jaynee Eagles, PA-C  albuterol (VENTOLIN HFA) 108 (90 Base) MCG/ACT inhaler INHALE 2 PUFFS INTO THE LUNGS EVERY 6 HOURS AS NEEDED FOR WHEEZING OR SHORTNESS OF BREATH 03/06/20   Hoyt Koch, MD  ALPRAZolam Duanne Moron) 0.25 MG tablet Take 0.5 tablets (0.125 mg total) by mouth at bedtime as needed. 10/29/20   Hoyt Koch, MD  citalopram (CELEXA) 10 MG tablet Take 1 tablet (10 mg total) by mouth daily. 09/29/22   Hoyt Koch, MD  Cyanocobalamin (VITAMIN B-12 PO) Take 1 tablet by mouth 2 (two) times a week.    [provider]  diltiazem (CARDIZEM CD) 120 MG 24 hr capsule Take 1 capsule (120 mg total) by mouth  in the morning. 09/29/22   Hoyt Koch, MD  fluticasone Asencion Islam) 50 MCG/ACT nasal spray Place 1 spray into both nostrils daily as needed for allergies. 05/15/22   Hoyt Koch, MD  furosemide (LASIX) 20 MG tablet Take 1 tablet by mouth once daily 09/29/22   Hoyt Koch, MD  guaiFENesin (MUCINEX) 600 MG 12 hr tablet Take 150 mg by mouth daily.    [provider]  ipratropium (ATROVENT) 0.03 % nasal spray Place 2 sprays into both nostrils every 12 (twelve) hours. 05/12/22   Freddi Starr, MD  magic mouthwash SOLN Take 10 mLs by mouth 4 (four) times daily as needed for mouth pain. 03/22/22   Shelly Coss, MD  montelukast (SINGULAIR) 10 MG tablet TAKE 1 TABLET BY MOUTH AT BEDTIME 07/16/22   Hoyt Koch, MD  Multiple Vitamins-Iron (MULTIVITAMIN/IRON PO) Take 1 tablet by mouth daily.    [provider]  Multiple Vitamins-Minerals (ZINC PO) Take 1 tablet by mouth daily.    [provider]  OPSUMIT 10 MG TABS Take 10 mg by mouth daily. 08/16/15   [provider]  OXYGEN Inhale into the lungs. 2 LMP at night and upon exertion    [provider]  pantoprazole (PROTONIX) 40  MG tablet Take 1 tablet (40 mg total) by mouth 2 (two) times daily before a meal. 07/22/22   Nandigam, Venia Minks, MD  spironolactone (ALDACTONE) 25 MG tablet Take 12.5 mg by mouth daily. 06/09/20   [provider]  sucralfate (CARAFATE) 1 GM/10ML suspension Take 10 mLs (1 g total) by mouth every 6 (six) hours as needed (reflux). 07/22/22   Mauri Pole, MD    Physical Exam: Vitals:   10/10/22 1800 10/10/22 1917 10/10/22 1930 10/10/22 2331  BP: (!) 126/42 (!) 99/55 100/68 (!) 132/44  Pulse: 82 83 85 84  Resp: 16 20 (!) 22 16  Temp:  98.4 F (36.9 C)  98.7 F (37.1 C)  TempSrc:    Oral  SpO2: 100% 95% 100% 95%   General:  Appears calm and comfortable and is in NAD Eyes:  PERRL, EOMI, normal lids, iris ENT:  grossly normal hearing, lips & tongue, mmm; appropriate dentition Neck:  no LAD, masses or thyromegaly; no carotid bruits Cardiovascular:  RRR,harsh systolic murmur. No LE edema.  Respiratory:   velcro sounds bilaterally.   Normal respiratory effort. Abdomen:  soft, NT, ND, NABS Back:   normal alignment, no CVAT Skin:  no rash or induration seen on limited exam Musculoskeletal:  Left lower leg shortened and abducted. No bruising. Edema at proximal femur and TTP. Pedal pulses intact. Sensation intact.  Lower extremity:  No LE edema.  Limited foot exam with no ulcerations.  2+ distal pulses. Psychiatric:  grossly normal mood and affect, speech fluent and appropriate, AOx3 Neurologic:  CN 2-12 grossly intact, moves all extremities in coordinated fashion, sensation intact   Radiological Exams on Admission: Independently reviewed - see discussion in A/P where applicable  DG Chest Port 1 View  Result Date: 10/10/2022 CLINICAL DATA:  Fall, chest injury EXAM: PORTABLE CHEST 1 VIEW COMPARISON:  07/28/2022 FINDINGS: Opacification of the upper lung zones bilaterally likely relates to overlying breast tissue. No pneumothorax or pleural effusion. Moderate cardiomegaly is  stable. No acute bone abnormality. Surgical clips are seen within the left breast and axilla. IMPRESSION: 1. No active disease. 2. Stable cardiomegaly. Electronically Signed   By: Fidela Salisbury M.D.   On: 10/10/2022 19:17   DG  Pelvis Portable  Result Date: 10/10/2022 CLINICAL DATA:  Fall at [place]: PORTABLE PELVIS 1-2 VIEWS; LEFT FEMUR PORTABLE 2 VIEWS COMPARISON:  CT abdomen and pelvis 04/04/2019 FINDINGS: Acute comminuted intertrochanteric fracture of the left proximal femur. There is superior displacement of the fracture. Mildly displaced greater and lesser trochanters. The femoral head remains located within the acetabulum though appears rotated superiorly. No additional fractures. IMPRESSION: Comminuted left intertrochanteric fracture. Electronically Signed   By: Placido Sou M.D.   On: 10/10/2022 19:06   DG Femur Portable Min 2 Views Left  Result Date: 10/10/2022 CLINICAL DATA:  Fall at [place]: PORTABLE PELVIS 1-2 VIEWS; LEFT FEMUR PORTABLE 2 VIEWS COMPARISON:  CT abdomen and pelvis 04/04/2019 FINDINGS: Acute comminuted intertrochanteric fracture of the left proximal femur. There is superior displacement of the fracture. Mildly displaced greater and lesser trochanters. The femoral head remains located within the acetabulum though appears rotated superiorly. No additional fractures. IMPRESSION: Comminuted left intertrochanteric fracture. Electronically Signed   By: Placido Sou M.D.   On: 10/10/2022 19:06    EKG: Independently reviewed.  NSR with rate 80; nonspecific ST changes with no evidence of acute ischemia   Labs on Admission: I have personally reviewed the available labs and imaging studies at the time of the admission.  Pertinent labs:   hgb: 10.1,  MCV: 112,  BUN: 37,  creatinine: 1.38  Assessment and Plan: Principal Problem:   Closed comminuted intertrochanteric fracture of left femur (HCC) Active Problems:   Essential hypertension   Pulmonary  hypertension (HCC)   ILD (interstitial lung disease) (HCC)   Chronic respiratory failure with hypoxia (HCC)   Pericardial effusion   Moderate aortic stenosis   CKD (chronic kidney disease) stage 3, GFR 30-59 ml/min (HCC)   Anemia, chronic disease   PULMONARY EMBOLISM, HX OF   GASTROESOPHAGEAL REFLUX DISEASE   Adjustment disorder    Assessment and Plan: * Closed comminuted intertrochanteric fracture of left femur ([place]) 86 year old presenting to ED after mechanical fall with subsequent left comminuted intertrochanteric fracture  -admit to med-surg -ortho consulted with plans for OR tomorrow -NPO after midnight  -pain medication, has been well controlled with morphine -robaxin PRN -ice -IS to bedside -RCRI class one risk   Essential hypertension Has been on softer side-low. Unsure if pain medication has contributed.  Med rec not done, will see how her pressures are before adding back her home medication   Pulmonary hypertension ([place]) -felt secondary to history of crest syndrome and interstitial lung disease as well as history of pulmonary embolus.   -She is followed by pulmonary and at [place]  -Continue present medications   ILD (interstitial lung disease) ([place]) Followed by pulm and at [place] therapy has been discussed previously at [facility] and not initiated due to the side effect profile  Continue 2L [place] oxygen at night   Chronic respiratory failure with hypoxia (HCC) On 2L oxygen at night only secondary to ILD  stable  Moderate aortic stenosis Moderate by echo in May 02/2022  LHC/RHC in 04/2022 with moderate AS not candidate for TAVR at that time   Pericardial effusion Large pericardial effusion by echo in 02/2022 that surrounds heart. There is no evidence by echo criteria for hemodynamic compromise. Mitral inflow shows no signficant respiratory variation, there is no RV collapse, IVC is normal size   CKD (chronic kidney disease) stage 3, GFR 30-59  ml/min (HCC) Stable, continue to monitor   Anemia, chronic disease IV iron infusions  q 6 weeks  Iron studies done 09/2022  Hgb stable at baseline   PULMONARY EMBOLISM, HX OF History of IVC placed in 2012   [place] protonix   Adjustment disorder Recently started on celexa 59m, but the 156mpill was too small to cut in half and the full pill made her sleepy in the afternoon      Advance Care Planning:   Code Status: DNR   Consults: ortho: Dr. BrRolena Infante DVT Prophylaxis: SCDs  Family Communication: none   Severity of Illness: The appropriate patient status for this patient is INPATIENT. Inpatient status is judged to be reasonable and necessary in order to provide the required intensity of service to ensure the patient's safety. The patient's presenting symptoms, physical exam findings, and initial radiographic and laboratory data in the context of their chronic comorbidities is felt to place them at high risk for further clinical deterioration. Furthermore, it is not anticipated that the patient will be medically stable for discharge from the hospital within 2 midnights of admission.   * I certify that at the point of admission it is my clinical judgment that the patient will require inpatient hospital care spanning beyond 2 midnights from the point of admission due to high intensity of service, high risk for further deterioration and high frequency of surveillance required.*  Author: AlOrma FlamingMD 10/10/2022 11:56 PM  For on call review [email or web address]

## 2022-10-10 NOTE — Progress Notes (Signed)
Pharmacy Consult for Pulmonary Hypertension Treatment   Indication - Continuation of prior to admission medication   Patient is 86 y.o.  with history of PAH on chronic Macitentan (Opsumit) PTA and will be continued while hospitalized.   Continuing this medication order as an inpatient requires that monitoring parameters per REMS requirements must be met.  Chronic therapy is under the supervision of Dr Rudean Hitt who is enrolled in the REMS program and is being notified of continuation of therapy. Per patient report has previously been educated on Hepatotoxicity . On admission pregnancy risk has been assessed and no monitoring required. Hepatic function has been evaluated. AST/ALT appropriate to continue medication at this time.     Latest Ref Rng & Units 10/10/2022    6:28 PM 09/17/2022    9:21 AM 08/15/2022   10:32 AM  Hepatic Function  Total Protein 6.5 - 8.1 g/dL 6.6  6.6  6.4   Albumin 3.5 - 5.0 g/dL 4.0  4.2  4.0   AST 15 - 41 U/L _0 ALT 0 - 44 U/L _1 Alk Phosphatase 38 - 126 U/L 54  51  56   Total Bilirubin 0.3 - 1.2 mg/dL 0.8  0.6  0.6     If any question arise or pregnancy is identified during hospitalization, contact for bosentan: [phone]; macitentan: [phone]; ambrisentan: [phone].  Thank for you allowing [place] to participate in the care of this patient.  Leone Haven, PharmD

## 2022-10-10 NOTE — Assessment & Plan Note (Signed)
Continue protonix

## 2022-10-10 NOTE — Assessment & Plan Note (Signed)
86 year old presenting to ED after mechanical fall with subsequent left comminuted intertrochanteric fracture  -admit to med-surg -ortho consulted with plans for OR tomorrow -NPO after midnight  -pain medication, has been well controlled with morphine -robaxin PRN -ice -IS to bedside -RCRI class one risk

## 2022-10-10 NOTE — Assessment & Plan Note (Addendum)
Followed by pulm and at [place] therapy has been discussed previously at her visits at [facility] and not initiated due to the side effect profile  Continue 2L [place] oxygen at night

## 2022-10-10 NOTE — Assessment & Plan Note (Addendum)
Moderate by echo in May 02/2022  LHC/RHC in 04/2022 with moderate AS not candidate for TAVR at that time

## 2022-10-10 NOTE — Assessment & Plan Note (Signed)
Stable, continue to monitor

## 2022-10-10 NOTE — Assessment & Plan Note (Addendum)
-  felt secondary to history of crest syndrome and interstitial lung disease as well as history of pulmonary embolus.   -She is followed by pulmonary and at [place]  -Continue present medications

## 2022-10-10 NOTE — Assessment & Plan Note (Signed)
Large pericardial effusion by echo in 02/2022 that surrounds heart. There is no evidence by echo criteria for hemodynamic compromise. Mitral inflow shows no signficant respiratory variation, there is no RV collapse, IVC is normal size

## 2022-10-10 NOTE — ED Triage Notes (Signed)
Pt via EMS from [place] after a fall. Pt was using her cane to pull an item from a shelf and overbalanced, falling to the floor. Reports pain to left upper thigh. No pelvic instability noted although EMS states there does appear to be some shortening and outward rotation. A/O x 4, no LOC, did not hit head, no thinners.   BP 140 palp HR 80 RR 20 O2 97 RA

## 2022-10-11 ENCOUNTER — Inpatient Hospital Stay (HOSPITAL_COMMUNITY): Payer: Medicare PPO | Admitting: Anesthesiology

## 2022-10-11 ENCOUNTER — Encounter (HOSPITAL_COMMUNITY)
Admission: EM | Disposition: A | Discharge status: Skilled Nursing Facility | Payer: Self-pay | Source: Home / Self Care | Attending: Internal Medicine

## 2022-10-11 ENCOUNTER — Other Ambulatory Visit: Payer: Self-pay | Admitting: Internal Medicine

## 2022-10-11 ENCOUNTER — Inpatient Hospital Stay (HOSPITAL_COMMUNITY): Payer: Medicare PPO

## 2022-10-11 DIAGNOSIS — S72002A Fracture of unspecified part of neck of left femur, initial encounter for closed fracture: Secondary | ICD-10-CM | POA: Diagnosis not present

## 2022-10-11 DIAGNOSIS — D631 Anemia in chronic kidney disease: Secondary | ICD-10-CM

## 2022-10-11 DIAGNOSIS — I129 Hypertensive chronic kidney disease with stage 1 through stage 4 chronic kidney disease, or unspecified chronic kidney disease: Secondary | ICD-10-CM

## 2022-10-11 DIAGNOSIS — N189 Chronic kidney disease, unspecified: Secondary | ICD-10-CM

## 2022-10-11 DIAGNOSIS — S72142A Displaced intertrochanteric fracture of left femur, initial encounter for closed fracture: Secondary | ICD-10-CM

## 2022-10-11 HISTORY — PX: INTRAMEDULLARY (IM) NAIL INTERTROCHANTERIC: SHX5875

## 2022-10-11 LAB — BASIC METABOLIC PANEL
Anion gap: 7 (ref 5–15)
BUN: 37 mg/dL — ABNORMAL HIGH (ref 8–23)
CO2: 26 mmol/L (ref 22–32)
Calcium: 8.6 mg/dL — ABNORMAL LOW (ref 8.9–10.3)
Chloride: 104 mmol/L (ref 98–111)
Creatinine, Ser: 1.4 mg/dL — ABNORMAL HIGH (ref 0.44–1.00)
GFR, Estimated: 37 mL/min — ABNORMAL LOW (ref >60)
Glucose, Bld: 133 mg/dL — ABNORMAL HIGH (ref 70–99)
Potassium: 4.4 mmol/L (ref 3.5–5.1)
Sodium: 137 mmol/L (ref 135–145)

## 2022-10-11 LAB — CBC
HCT: 27.2 % — ABNORMAL LOW (ref 36.0–46.0)
Hemoglobin: 8.7 g/dL — ABNORMAL LOW (ref 12.0–15.0)
MCH: 36 pg — ABNORMAL HIGH (ref 26.0–34.0)
MCHC: 32 g/dL (ref 30.0–36.0)
MCV: 112.4 fL — ABNORMAL HIGH (ref 80.0–100.0)
Platelets: 168 10*3/uL (ref 150–400)
RBC: 2.42 MIL/uL — ABNORMAL LOW (ref 3.87–5.11)
RDW: 13.6 % (ref 11.5–15.5)
WBC: 8.9 10*3/uL (ref 4.0–10.5)
nRBC: 0 % (ref 0.0–0.2)

## 2022-10-11 LAB — FOLATE: Folate: 21.5 ng/mL (ref >5.9)

## 2022-10-11 LAB — PROTIME-INR
INR: 1.1 (ref 0.8–1.2)
Prothrombin Time: 13.9 seconds (ref 11.4–15.2)

## 2022-10-11 LAB — VITAMIN B12: Vitamin B-12: 254 pg/mL (ref 180–914)

## 2022-10-11 LAB — VITAMIN D 25 HYDROXY (VIT D DEFICIENCY, FRACTURES): Vit D, 25-Hydroxy: 51.61 ng/mL (ref 30–100)

## 2022-10-11 SURGERY — FIXATION, FRACTURE, INTERTROCHANTERIC, WITH INTRAMEDULLARY ROD
Anesthesia: General | Site: Hip | Laterality: Left

## 2022-10-11 MED ORDER — CEFAZOLIN SODIUM-DEXTROSE 2-4 GM/100ML-% IV SOLN
2.0000 g | Freq: Once | INTRAVENOUS | Status: AC
  Administered 2022-10-11: 2 g via INTRAVENOUS
  Filled 2022-10-11: qty 100

## 2022-10-11 MED ORDER — ADULT MULTIVITAMIN W/MINERALS CH
1.0000 | ORAL_TABLET | Freq: Every day | ORAL | Status: DC
  Administered 2022-10-12 – 2022-10-15 (×3): 1 via ORAL
  Filled 2022-10-11 (×5): qty 1

## 2022-10-11 MED ORDER — APIXABAN 2.5 MG PO TABS
2.5000 mg | ORAL_TABLET | Freq: Two times a day (BID) | ORAL | Status: DC
  Administered 2022-10-12 – 2022-10-15 (×7): 2.5 mg via ORAL
  Filled 2022-10-11 (×7): qty 1

## 2022-10-11 MED ORDER — HYDROCODONE-ACETAMINOPHEN 5-325 MG PO TABS
1.0000 | ORAL_TABLET | ORAL | Status: DC | PRN
  Filled 2022-10-11: qty 2

## 2022-10-11 MED ORDER — CEFAZOLIN SODIUM-DEXTROSE 1-4 GM/50ML-% IV SOLN
1.0000 g | Freq: Four times a day (QID) | INTRAVENOUS | Status: AC
  Administered 2022-10-11 – 2022-10-12 (×2): 1 g via INTRAVENOUS
  Filled 2022-10-11 (×2): qty 50

## 2022-10-11 MED ORDER — MENTHOL 3 MG MT LOZG: 1.0000 | LOZENGE | OROMUCOSAL | Status: DC | PRN

## 2022-10-11 MED ORDER — PROPOFOL 10 MG/ML IV BOLUS
INTRAVENOUS | Status: AC
  Filled 2022-10-11: qty 20

## 2022-10-11 MED ORDER — ONDANSETRON HCL 4 MG/2ML IJ SOLN
INTRAMUSCULAR | Status: DC | PRN
  Administered 2022-10-11: 4 mg via INTRAVENOUS

## 2022-10-11 MED ORDER — FENTANYL CITRATE PF 50 MCG/ML IJ SOSY: 25.0000 ug | PREFILLED_SYRINGE | INTRAMUSCULAR | Status: DC | PRN

## 2022-10-11 MED ORDER — STERILE WATER FOR IRRIGATION IR SOLN
Status: DC | PRN
  Administered 2022-10-11: 1000 mL

## 2022-10-11 MED ORDER — LIDOCAINE 2% (20 MG/ML) 5 ML SYRINGE
INTRAMUSCULAR | Status: DC | PRN
  Administered 2022-10-11: 60 mg via INTRAVENOUS

## 2022-10-11 MED ORDER — ENSURE ENLIVE PO LIQD
237.0000 mL | Freq: Two times a day (BID) | ORAL | Status: DC
  Filled 2022-10-11 (×2): qty 237

## 2022-10-11 MED ORDER — SENNA 8.6 MG PO TABS
1.0000 | ORAL_TABLET | Freq: Every day | ORAL | Status: DC
  Administered 2022-10-15: 8.6 mg via ORAL
  Filled 2022-10-11 (×4): qty 1

## 2022-10-11 MED ORDER — POLYETHYLENE GLYCOL 3350 17 G PO PACK
17.0000 g | PACK | Freq: Every day | ORAL | Status: DC
  Administered 2022-10-12 – 2022-10-14 (×3): 17 g via ORAL
  Filled 2022-10-11 (×4): qty 1

## 2022-10-11 MED ORDER — FENTANYL CITRATE (PF) 100 MCG/2ML IJ SOLN
INTRAMUSCULAR | Status: AC
  Filled 2022-10-11: qty 2

## 2022-10-11 MED ORDER — ONDANSETRON HCL 4 MG/2ML IJ SOLN: 4.0000 mg | Freq: Once | INTRAMUSCULAR | Status: DC | PRN

## 2022-10-11 MED ORDER — METOCLOPRAMIDE HCL 5 MG PO TABS: 5.0000 mg | ORAL_TABLET | Freq: Three times a day (TID) | ORAL | Status: DC | PRN

## 2022-10-11 MED ORDER — PHENYLEPHRINE 80 MCG/ML (10ML) SYRINGE FOR IV PUSH (FOR BLOOD PRESSURE SUPPORT)
PREFILLED_SYRINGE | INTRAVENOUS | Status: AC
  Filled 2022-10-11: qty 10

## 2022-10-11 MED ORDER — FENTANYL CITRATE (PF) 100 MCG/2ML IJ SOLN
INTRAMUSCULAR | Status: DC | PRN
  Administered 2022-10-11: 50 ug via INTRAVENOUS
  Administered 2022-10-11: 25 ug via INTRAVENOUS
  Administered 2022-10-11: 50 ug via INTRAVENOUS

## 2022-10-11 MED ORDER — LACTATED RINGERS IV SOLN: INTRAVENOUS | Status: DC | PRN

## 2022-10-11 MED ORDER — DILTIAZEM HCL ER COATED BEADS 120 MG PO CP24
120.0000 mg | ORAL_CAPSULE | Freq: Every morning | ORAL | Status: DC
  Administered 2022-10-12 – 2022-10-15 (×4): 120 mg via ORAL
  Filled 2022-10-11 (×4): qty 1

## 2022-10-11 MED ORDER — ONDANSETRON HCL 4 MG/2ML IJ SOLN
INTRAMUSCULAR | Status: AC
  Filled 2022-10-11: qty 2

## 2022-10-11 MED ORDER — PHENYLEPHRINE 80 MCG/ML (10ML) SYRINGE FOR IV PUSH (FOR BLOOD PRESSURE SUPPORT)
PREFILLED_SYRINGE | INTRAVENOUS | Status: DC | PRN
  Administered 2022-10-11 (×3): 160 ug via INTRAVENOUS

## 2022-10-11 MED ORDER — ACETAMINOPHEN 500 MG PO TABS
500.0000 mg | ORAL_TABLET | Freq: Four times a day (QID) | ORAL | Status: AC
  Administered 2022-10-11 – 2022-10-12 (×3): 500 mg via ORAL
  Filled 2022-10-11 (×3): qty 1

## 2022-10-11 MED ORDER — ONDANSETRON HCL 4 MG/2ML IJ SOLN: 4.0000 mg | Freq: Four times a day (QID) | INTRAMUSCULAR | Status: DC | PRN

## 2022-10-11 MED ORDER — 0.9 % SODIUM CHLORIDE (POUR BTL) OPTIME
TOPICAL | Status: DC | PRN
  Administered 2022-10-11: 1000 mL

## 2022-10-11 MED ORDER — PHENOL 1.4 % MT LIQD
1.0000 | OROMUCOSAL | Status: DC | PRN
  Administered 2022-10-11: 1 via OROMUCOSAL
  Filled 2022-10-11: qty 177

## 2022-10-11 MED ORDER — DOCUSATE SODIUM 100 MG PO CAPS
100.0000 mg | ORAL_CAPSULE | Freq: Two times a day (BID) | ORAL | Status: DC
  Administered 2022-10-11 – 2022-10-15 (×8): 100 mg via ORAL
  Filled 2022-10-11 (×8): qty 1

## 2022-10-11 MED ORDER — MORPHINE SULFATE (PF) 2 MG/ML IV SOLN: 0.5000 mg | INTRAVENOUS | Status: DC | PRN

## 2022-10-11 MED ORDER — OXYCODONE HCL 5 MG PO TABS: 5.0000 mg | ORAL_TABLET | Freq: Once | ORAL | Status: DC | PRN

## 2022-10-11 MED ORDER — OXYCODONE HCL 5 MG/5ML PO SOLN: 5.0000 mg | Freq: Once | ORAL | Status: DC | PRN

## 2022-10-11 MED ORDER — DEXAMETHASONE SODIUM PHOSPHATE 10 MG/ML IJ SOLN
INTRAMUSCULAR | Status: DC | PRN
  Administered 2022-10-11: 4 mg via INTRAVENOUS

## 2022-10-11 MED ORDER — DEXAMETHASONE SODIUM PHOSPHATE 10 MG/ML IJ SOLN
INTRAMUSCULAR | Status: AC
  Filled 2022-10-11: qty 1

## 2022-10-11 MED ORDER — ONDANSETRON HCL 4 MG PO TABS: 4.0000 mg | ORAL_TABLET | Freq: Four times a day (QID) | ORAL | Status: DC | PRN

## 2022-10-11 MED ORDER — METOCLOPRAMIDE HCL 5 MG/ML IJ SOLN: 5.0000 mg | Freq: Three times a day (TID) | INTRAMUSCULAR | Status: DC | PRN

## 2022-10-11 MED ORDER — SODIUM CHLORIDE 0.9 % IV SOLN: INTRAVENOUS | Status: DC

## 2022-10-11 SURGICAL SUPPLY — 45 items
BAG COUNTER SPONGE SURGICOUNT (BAG) IMPLANT
BAG SPEC THK2 15X12 ZIP CLS (MISCELLANEOUS) ×1
BAG SPNG CNTER NS LX DISP (BAG)
BAG ZIPLOCK 12X15 (MISCELLANEOUS) ×2 IMPLANT
BIT DRILL CANN LG 4.3MM (BIT) IMPLANT
BIT DRILL LAG SCREW (DRILL) IMPLANT
BNDG GAUZE DERMACEA FLUFF 4 (GAUZE/BANDAGES/DRESSINGS) ×2 IMPLANT
BNDG GZE DERMACEA 4 6PLY (GAUZE/BANDAGES/DRESSINGS) ×1
COVER PERINEAL POST (MISCELLANEOUS) ×2 IMPLANT
COVER SURGICAL LIGHT HANDLE (MISCELLANEOUS) ×2 IMPLANT
DRAPE INCISE IOBAN 66X45 STRL (DRAPES) ×2 IMPLANT
DRESSING MEPILEX FLEX 4X4 (GAUZE/BANDAGES/DRESSINGS) ×4 IMPLANT
DRILL BIT CANN LG 4.3MM (BIT) ×1
DRILL LAG SCREW (DRILL) ×1
DRSG AQUACEL AG ADV 3.5X 6 (GAUZE/BANDAGES/DRESSINGS) IMPLANT
DRSG MEPILEX FLEX 4X4 (GAUZE/BANDAGES/DRESSINGS) ×2
DRSG MEPILEX POST OP 4X8 (GAUZE/BANDAGES/DRESSINGS) ×2 IMPLANT
DURAPREP 26ML APPLICATOR (WOUND CARE) ×2 IMPLANT
ELECT REM PT RETURN 15FT ADLT (MISCELLANEOUS) ×2 IMPLANT
FACESHIELD WRAPAROUND (MASK) ×3 IMPLANT
FACESHIELD WRAPAROUND OR TEAM (MASK) ×6 IMPLANT
GLOVE BIO SURGEON STRL SZ 6.5 (GLOVE) ×4 IMPLANT
GLOVE BIO SURGEON STRL SZ7 (GLOVE) ×2 IMPLANT
GLOVE BIO SURGEON STRL SZ8 (GLOVE) ×6 IMPLANT
GLOVE BIOGEL PI IND STRL 7.0 (GLOVE) ×4 IMPLANT
GLOVE BIOGEL PI IND STRL 8 (GLOVE) ×4 IMPLANT
GOWN STRL REUS W/ TWL LRG LVL3 (GOWN DISPOSABLE) ×6 IMPLANT
GOWN STRL REUS W/TWL LRG LVL3 (GOWN DISPOSABLE) ×3
GUIDEPIN VERSANAIL DSP 3.2X444 (ORTHOPEDIC DISPOSABLE SUPPLIES) IMPLANT
KIT BASIN OR (CUSTOM PROCEDURE TRAY) ×2 IMPLANT
KIT TURNOVER KIT A (KITS) IMPLANT
MANIFOLD NEPTUNE II (INSTRUMENTS) ×2 IMPLANT
NAIL HIP FRACT 130D 11X180 (Screw) IMPLANT
NS IRRIG 1000ML POUR BTL (IV SOLUTION) ×2 IMPLANT
PACK GENERAL/GYN (CUSTOM PROCEDURE TRAY) ×2 IMPLANT
PROTECTOR NERVE ULNAR (MISCELLANEOUS) ×2 IMPLANT
SCREW BONE CORTICAL 5.0X38 (Screw) IMPLANT
SCREW LAG HIP NAIL 10.5X95 (Screw) IMPLANT
STAPLER VISISTAT 35W (STAPLE) ×2 IMPLANT
SUT VIC AB 1 CT1 27 (SUTURE) ×1
SUT VIC AB 1 CT1 27XBRD ANTBC (SUTURE) ×2 IMPLANT
SUT VIC AB 2-0 CT1 27 (SUTURE) ×1
SUT VIC AB 2-0 CT1 TAPERPNT 27 (SUTURE) ×2 IMPLANT
TOWEL OR 17X26 10 PK STRL BLUE (TOWEL DISPOSABLE) ×2 IMPLANT
TRAY FOLEY MTR SLVR 16FR STAT (SET/KITS/TRAYS/PACK) IMPLANT

## 2022-10-11 NOTE — Consult Note (Signed)
Reason for Consult:Left intertrochanteric femur fracture Referring Physician: Dr Clenton Pare is an 86 y.o. female.  HPI: [name] is an 86 yo female who was in [place] yesterday and was reaching for a product on a high shelf with her cane when another shopper bumped into her and she fell, landing on her left side. She had immediate left hip pain with inability to get up. She denies head injury or LOC. Her left hip is the only area hurting her today. She denies LLE paresthesia or radiating pain. She was taken to the ED and noted to have a left intertrochanteric femur fracture. She was admitted to the hospitalist service and we were consulted for management  Past Medical History:  Diagnosis Date   Anemia    Angiodysplasia of stomach and duodenum    Arthus phenomenon    AVM (arteriovenous malformation)    Blood transfusion without reported diagnosis    last 4 units 12-22-15, Iron infusion x2 last -01-07-16,01-14-16.   Breast cancer ([place]) 1989   Left   Candida esophagitis (HCC)    Cataract    Chronic kidney disease    Chronic mild renal insuffiency   CREST syndrome (HCC)    GERD (gastroesophageal reflux disease)    w/ HH   Interstitial lung disease (HCC)    Nodule of kidney    Pericardial effusion    PONV (postoperative nausea and vomiting)    Pulmonary embolus ([place]) 2003   Pulmonary hypertension ([place])    followed by Dr Gaynell Face at [facility], now Dr. Maryjean Ka visit end 2'17.   Rectal incontinence    Renal cell carcinoma (HCC)    Scleroderma (HCC)    Tubular adenoma of colon    Uterine polyp     Past Surgical History:  Procedure Laterality Date   APPENDECTOMY  1962   BREAST LUMPECTOMY  1989   left   CATARACT EXTRACTION, BILATERAL Bilateral 12/2013   COLONOSCOPY WITH PROPOFOL N/A 04/15/2021   Procedure: COLONOSCOPY WITH PROPOFOL;  Surgeon: Milus Banister, MD;  Location: [facility];  Service: Endoscopy;  Laterality: N/A;   ENTEROSCOPY N/A 01/18/2016   Procedure:  ENTEROSCOPY;  Surgeon: Mauri Pole, MD;  Location: [facility];  Service: Endoscopy;  Laterality: N/A;   ENTEROSCOPY N/A 05/24/2018   Procedure: ENTEROSCOPY;  Surgeon: Jackquline Denmark, MD;  [facility];  Service: Endoscopy;  Laterality: N/A;   ENTEROSCOPY N/A 03/29/2021   Procedure: ENTEROSCOPY;  Surgeon: Jerene Bears, MD;  [facility];  Service: Gastroenterology;  Laterality: N/A;   ENTEROSCOPY N/A 04/13/2021   Procedure: ENTEROSCOPY;  Surgeon: Yetta Flock, MD;  [facility];  Service: Gastroenterology;  Laterality: N/A;   ESOPHAGOGASTRODUODENOSCOPY (EGD) WITH PROPOFOL N/A 12/21/2015   Procedure: ESOPHAGOGASTRODUODENOSCOPY (EGD) WITH PROPOFOL;  Surgeon: Irene Shipper, MD;  [facility];  Service: Endoscopy;  Laterality: N/A;   HOT HEMOSTASIS N/A 05/24/2018   Procedure: HOT HEMOSTASIS (ARGON PLASMA COAGULATION/BICAP);  Surgeon: Jackquline Denmark, MD;  Location[facility];  Service: Endoscopy;  Laterality: N/A;   HOT HEMOSTASIS N/A 03/29/2021   Procedure: HOT HEMOSTASIS (ARGON PLASMA COAGULATION/BICAP);  Surgeon: Jerene Bears, MD;  Location: [facility];  Service: Gastroenterology;  Laterality: N/A;   HOT HEMOSTASIS N/A 04/13/2021   Procedure: HOT HEMOSTASIS (ARGON PLASMA COAGULATION/BICAP);  Surgeon: Yetta Flock, MD;  Location: [facility];  Service: Gastroenterology;  Laterality: N/A;   IR GENERIC HISTORICAL  06/05/2016   IR RADIOLOGIST EVAL & MGMT 06/05/2016 Aletta Edouard, MD [facility]  INTERV RAD   IVC Filter     KIDNEY SURGERY     left -"laser surgery by Dr. Kathlene Cote- 5 yrs ago" no removal   RIGHT/LEFT HEART CATH AND CORONARY ANGIOGRAPHY N/A 04/25/2022   Procedure: RIGHT/LEFT HEART CATH AND CORONARY ANGIOGRAPHY;  Surgeon: Sherren Mocha, MD;  Location: [place] [facility];  Service: Cardiovascular;  Laterality: N/A;   SCHLEROTHERAPY  05/24/2018   Procedure: [place]ward Ku;  Surgeon: Jackquline Denmark, MD;  [facility];  Service:  Endoscopy;;   SUBMUCOSAL TATTOO INJECTION  04/13/2021   Procedure: SUBMUCOSAL TATTOO INJECTION;  Surgeon: Yetta Flock, MD;  [facility];  Service: Gastroenterology;;   TONSILLECTOMY     TUBAL LIGATION      Family History  Problem Relation Age of Onset   Bladder Cancer Father    Diabetes Father    Prostate cancer Father    Alzheimer's disease Mother    Diabetes Sister    Lung cancer Sister         smoker   Esophageal cancer Paternal Uncle    Colon cancer Neg Hx     Social History:  reports that she has never smoked. She has never used smokeless tobacco. She reports that she does not drink alcohol and does not use drugs.  Allergies:  Allergies  Allergen Reactions   Codeine Nausea Only    Hallucinations, too   Other Nausea And Vomiting    "-mycin" antibiotics.   Also cause hallucinations.   Erythromycin Nausea And Vomiting   Iodinated Contrast Media Other (See Comments)    Renal issues   Lisinopril     Pt doesn't remember    Mycophenolate Mofetil Nausea Only   Sulfa Antibiotics Other (See Comments)    unknown    Medications: I have reviewed the patient's current medications.  Results for orders placed or performed during the hospital encounter of 10/10/22 (from the past 48 hour(s))  CBC with Differential     Status: Abnormal   Collection Time: 10/10/22  6:28 PM  Result Value Ref Range   WBC 7.0 4.0 - 10.5 K/uL   RBC 2.82 (L) 3.87 - 5.11 MIL/uL   Hemoglobin 10.1 (L) 12.0 - 15.0 g/dL   HCT 31.7 (L) 36.0 - 46.0 %   MCV 112.4 (H) 80.0 - 100.0 fL   MCH 35.8 (H) 26.0 - 34.0 pg   MCHC 31.9 30.0 - 36.0 g/dL   RDW 13.7 11.5 - 15.5 %   Platelets 178 150 - 400 K/uL   nRBC 0.0 0.0 - 0.2 %   Neutrophils Relative % 83 %   Neutro Abs 5.8 1.7 - 7.7 K/uL   Lymphocytes Relative 12 %   Lymphs Abs 0.8 0.7 - 4.0 K/uL   Monocytes Relative 4 %   Monocytes Absolute 0.3 0.1 - 1.0 K/uL   Eosinophils Relative 0 %   Eosinophils Absolute 0.0 0.0 - 0.5 K/uL    Basophils Relative 1 %   Basophils Absolute 0.0 0.0 - 0.1 K/uL   Immature Granulocytes 0 %   Abs Immature Granulocytes 0.03 0.00 - 0.07 K/uL   Polychromasia PRESENT    Ovalocytes PRESENT     Comment: Performed at [facility], [place] [address] [place], [place] [address]  Comprehensive metabolic panel     Status: Abnormal   Collection Time: 10/10/22  6:28 PM  Result Value Ref Range   Sodium 137 135 - 145 mmol/L   Potassium 3.9 3.5 - 5.1 mmol/L   Chloride 102  98 - 111 mmol/L   CO2 26 22 - 32 mmol/L   Glucose, Bld 148 (H) 70 - 99 mg/dL    Comment: Glucose reference range applies only to samples taken after fasting for at least 8 hours.   BUN 37 (H) 8 - 23 mg/dL   Creatinine, Ser 1.38 (H) 0.44 - 1.00 mg/dL   Calcium 9.0 8.9 - 10.3 mg/dL   Total Protein 6.6 6.5 - 8.1 g/dL   Albumin 4.0 3.5 - 5.0 g/dL   AST 24 15 - 41 U/L   ALT 14 0 - 44 U/L   Alkaline Phosphatase 54 38 - 126 U/L   Total Bilirubin 0.8 0.3 - 1.2 mg/dL   GFR, Estimated 38 (L) >60 mL/min    Comment: (NOTE) Calculated using the CKD-EPI Creatinine Equation (2021)    Anion gap 9 5 - 15    Comment: Performed at [facility], [place] [address], [place], [place] [address]  Protime-INR     Status: None   Collection Time: 10/10/22  6:28 PM  Result Value Ref Range   Prothrombin Time 14.2 11.4 - 15.2 seconds   INR 1.1 0.8 - 1.2    Comment: (NOTE) INR goal varies based on device and disease states. Performed at [facility], [place] [address], [place], [place] [address]   Protime-INR     Status: None   Collection Time: 10/10/22 11:46 PM  Result Value Ref Range   Prothrombin Time 13.9 11.4 - 15.2 seconds   INR 1.1 0.8 - 1.2    Comment: (NOTE) INR goal varies based on device and disease states. Performed at [facility], [place] [address] [place], [place] [address]   Type and screen [place]     Status: None   Collection Time:  10/10/22 11:46 PM  Result Value Ref Range   ABO/RH(D) A POS    Antibody Screen NEG    Sample Expiration      10/13/2022,2359 Performed at [facility], [place] [address], [place], [place] [address]   CBC     Status: Abnormal   Collection Time: 10/11/22  4:04 AM  Result Value Ref Range   WBC 8.9 4.0 - 10.5 K/uL   RBC 2.42 (L) 3.87 - 5.11 MIL/uL   Hemoglobin 8.7 (L) 12.0 - 15.0 g/dL   HCT 27.2 (L) 36.0 - 46.0 %   MCV 112.4 (H) 80.0 - 100.0 fL   MCH 36.0 (H) 26.0 - 34.0 pg   MCHC 32.0 30.0 - 36.0 g/dL   RDW 13.6 11.5 - 15.5 %   Platelets 168 150 - 400 K/uL   nRBC 0.0 0.0 - 0.2 %    Comment: Performed at [facility], [place] [address], [place], [place] [address]  Basic metabolic panel     Status: Abnormal   Collection Time: 10/11/22  4:04 AM  Result Value Ref Range   Sodium 137 135 - 145 mmol/L   Potassium 4.4 3.5 - 5.1 mmol/L   Chloride 104 98 - 111 mmol/L   CO2 26 22 - 32 mmol/L   Glucose, Bld 133 (H) 70 - 99 mg/dL    Comment: Glucose reference range applies only to samples taken after fasting for at least 8 hours.   BUN 37 (H) 8 - 23 mg/dL   Creatinine, Ser 1.40 (H) 0.44 - 1.00 mg/dL   Calcium 8.6 (L) 8.9 - 10.3 mg/dL   GFR, Estimated 37 (L) >60 mL/min  Comment: (NOTE) Calculated using the CKD-EPI Creatinine Equation (2021)    Anion gap 7 5 - 15    Comment: Performed at [facility], [place] [address] [place], [place] [address]   *Note: Due to a large number of results and/or encounters for the requested time period, some results have not been displayed. A complete set of results can be found in Results Review.    DG Chest Port 1 View  Result Date: 10/10/2022 CLINICAL DATA:  Fall, chest injury EXAM: PORTABLE CHEST 1 VIEW COMPARISON:  07/28/2022 FINDINGS: Opacification of the upper lung zones bilaterally likely relates to overlying breast tissue. No pneumothorax or pleural effusion. Moderate cardiomegaly is stable. No  acute bone abnormality. Surgical clips are seen within the left breast and axilla. IMPRESSION: 1. No active disease. 2. Stable cardiomegaly. Electronically Signed   By: Fidela Salisbury M.D.   On: 10/10/2022 19:17   DG Pelvis Portable  Result Date: 10/10/2022 CLINICAL DATA:  Fall at [place] 1-2 VIEWS; LEFT FEMUR PORTABLE 2 VIEWS COMPARISON:  CT abdomen and pelvis 04/04/2019 FINDINGS: Acute comminuted intertrochanteric fracture of the left proximal femur. There is superior displacement of the fracture. Mildly displaced greater and lesser trochanters. The femoral head remains located within the acetabulum though appears rotated superiorly. No additional fractures. IMPRESSION: Comminuted left intertrochanteric fracture. Electronically Signed   By: Placido Sou M.D.   On: 10/10/2022 19:06   DG Femur Portable Min 2 Views Left  Result Date: 10/10/2022 CLINICAL DATA:  Fall at [place]: PORTABLE PELVIS 1-2 VIEWS; LEFT FEMUR PORTABLE 2 VIEWS COMPARISON:  CT abdomen and pelvis 04/04/2019 FINDINGS: Acute comminuted intertrochanteric fracture of the left proximal femur. There is superior displacement of the fracture. Mildly displaced greater and lesser trochanters. The femoral head remains located within the acetabulum though appears rotated superiorly. No additional fractures. IMPRESSION: Comminuted left intertrochanteric fracture. Electronically Signed   By: Placido Sou M.D.   On: 10/10/2022 19:06    Review of Systems Blood pressure (!) 110/48, pulse 94, temperature 98.3 F (36.8 C), temperature source Oral, resp. rate 18, height 5' 2.99" (1.6 m), weight 49.2 kg, SpO2 97 %. Physical Exam Physical Examination: General appearance - alert, well appearing, and in no distress Mental status - alert, oriented to person, place, and time Extremities - no pedal edema noted, intact peripheral pulses, extremity shortened and externally rotated, sensation intact, EHL/FHL/TA/gastroc  intact    Assessment/Plan: Comminuted left intertrochanteric femur fracture- Plan operative fixation with IM nail. Discussed procedure, risks, potential complications and rehab course with the patient who elects to proceed.  Pilar Plate Augusten Lipkin 10/11/2022, 9:00 AM

## 2022-10-11 NOTE — Anesthesia Preprocedure Evaluation (Addendum)
Anesthesia Evaluation  Patient identified by MRN, date of birth, ID band Patient awake    Reviewed: Allergy & Precautions, NPO status , Patient's Chart, lab work & pertinent test results  History of Anesthesia Complications (+) PONV and history of anesthetic complications  Airway Mallampati: II  TM Distance: >3 FB Neck ROM: Full    Dental  (+) Dental Advisory Given   Pulmonary  COPD inhaler  Prn O2   Pulmonary exam normal        Cardiovascular hypertension, Pt. on medications (-) CAD Normal cardiovascular exam+ Valvular Problems/Murmurs AS    '23 Cath - Ectatic but patent coronary arteries with no obstructive disease.  Right dominant coronary artery. 2.  Mild pulmonary hypertension with PA pressure 52/15, mean 28 mmHg, transpulmonary gradient 10 mmHg, PVR approximately 2 [place] units. 3.  Moderate aortic stenosis with mean transvalvular gradient 16 mmHg calculated aortic valve area 1.16 cm 4.  Heavy mitral annular calcification by plain fluoroscopy 5.  Mild aortic valve calcification by plain fluoroscopy  '23 TTE - EF 55 to 60%. There is moderate left ventricular hypertrophy. Grade I diastolic dysfunction (impaired relaxation). Left atrial size was severely dilated. Large pericardial effusion surrounds heart. Most prominent along lateral side of LV 24 mm in maximal dimension. There is no evidence by echo criteria for hemodynamic compromise. Large pleural effusion. Trivial mitral valve regurgitation. Peak and mean gradients through the AV are 45 and 25 mm Hg respectively. Dimensionless index is 0.26. Overall consistent with moderate AS.      Neuro/Psych negative neurological ROS  negative psych ROS   GI/Hepatic Neg liver ROS,GERD  Medicated and Controlled,,  Endo/Other  negative endocrine ROS    Renal/GU CRFRenal disease Hx Clear Cell RCC      Musculoskeletal  (+) Arthritis ,    Abdominal   Peds  Hematology  (+)  Blood dyscrasia, anemia   Anesthesia Other Findings CREST syndrome   Reproductive/Obstetrics  Breast cancer                              Anesthesia Physical Anesthesia Plan  ASA: 3  Anesthesia Plan: General   Post-op Pain Management: Tylenol PO (pre-op)*   Induction: Intravenous  PONV Risk Score and Plan: 4 or greater and Treatment may vary due to age or medical condition, TIVA and Ondansetron  Airway Management Planned: LMA  Additional Equipment: None  Intra-op Plan:   Post-operative Plan: Extubation in OR  Informed Consent: I have reviewed the patients History and Physical, chart, labs and discussed the procedure including the risks, benefits and alternatives for the proposed anesthesia with the patient or authorized representative who has indicated his/her understanding and acceptance.     Dental advisory given  Plan Discussed with: CRNA and Anesthesiologist  Anesthesia Plan Comments:        Anesthesia Quick Evaluation

## 2022-10-11 NOTE — Anesthesia Postprocedure Evaluation (Signed)
Anesthesia Post Note  Patient: Paula Ward  Procedure(s) Performed: INTRAMEDULLARY (IM) NAIL INTERTROCHANTERIC (Left: Hip)     Patient location during evaluation: PACU Anesthesia Type: General Level of consciousness: awake and alert Pain management: pain level controlled Vital Signs Assessment: post-procedure vital signs reviewed and stable Respiratory status: spontaneous breathing, nonlabored ventilation, respiratory function stable and patient connected to nasal cannula oxygen Cardiovascular status: blood pressure returned to baseline and stable Postop Assessment: no apparent nausea or vomiting Anesthetic complications: no   No notable events documented.  Last Vitals:  Vitals:   10/11/22 1345 10/11/22 1400  BP: (!) 145/48 (!) 136/48  Pulse: 87 86  Resp: 12 16  Temp:  36.5 C  SpO2: 92% 96%    Last Pain:  Vitals:   10/11/22 1345  TempSrc:   PainSc: [name]

## 2022-10-11 NOTE — Anesthesia Procedure Notes (Signed)
Procedure Name: LMA Insertion Date/Time: 10/11/2022 12:00 PM  Performed by: Tevis Conger D, CRNAPre-anesthesia Checklist: Patient identified, Emergency Drugs available, Suction available and Patient being monitored Patient Re-evaluated:Patient Re-evaluated prior to induction Oxygen Delivery Method: Circle system utilized Preoxygenation: Pre-oxygenation with 100% oxygen Induction Type: IV induction Ventilation: Mask ventilation without difficulty LMA: LMA inserted LMA Size: 3.0 Tube type: Oral Number of attempts: 1 Placement Confirmation: positive ETCO2 and breath sounds checked- equal and bilateral Tube secured with: Tape Dental Injury: Teeth and Oropharynx as per pre-operative assessment

## 2022-10-11 NOTE — Plan of Care (Signed)
  Problem: Education: Goal: Knowledge of General Education information will improve Description: Including pain rating scale, medication(s)/side effects and non-pharmacologic comfort measures Outcome: Progressing

## 2022-10-11 NOTE — Op Note (Signed)
  OPERATIVE REPORT   PREOPERATIVE DIAGNOSIS: Left intertrochanteric femur fracture.   POSTOP DIAGNOSIS: Left intertrochanteric femur fracture.   PROCEDURE: Intramedullary nailing, Left intertrochanteric femur  fracture.   SURGEON: Gaynelle Arabian, M.D.   ASSISTANT: Theresa Duty, PA-C  ANESTHESIA:General  Estimated BLOOD LOSS: 25 ml  DRAINS: None.   COMPLICATIONS:   None  CONDITION: -PACU - hemodynamically stable.    CLINICAL NOTE: Paula Ward is an 86 y.o. female, who had a fall yesterday  sustaining a displaced  Left intertrochanteric femur fracture. They have been cleared medically and present for operative fixation   PROCEDURE IN DETAIL: After successful administration of  General,  the patient was placed on the fracture table with Left lower extremity in a well-padded traction boot,  Right lower extremity in a well-padded leg holder. Under fluoroscopic guidance, the fracture wasreduced. The traction was locked in this position. Thigh was prepped  and draped in the usual sterile fashion. The guide pin for the Biomet  Affixus was then passed percutaneously to the tip of the greater  trochanter, and then entered into the femoral canal. It was passed into the  canal. The small incision was made and the starter reamer passed over  the guide pin. This was then removed. The nail which was an 11  mm  diameter short trochanteric nail with 130 degrees angle was attached to  the external guide and then passed into the femoral canal, impacted to  the appropriate depth in the canal, then we used the external guide to  place the lag screw. Through the external guide, a guide pin was  passed. Small incision made, and the guide pin was in the center of the  femoral head on the AP and slightly center to posterior on the lateral.  Length was 95 mm. Triple reamer was passed over the guide pin. 95 mm  lag screw was placed. It was then locked down with a locking screw.  Through the  external guide, the distal interlock was placed through the  static hole and this was 38 mm in length with excellent bicortical  purchase. The external guide was then removed. Hardware was in good  position and fracture was well reduced. Wound was copiously irrigated with saline  solution, and  closed deep with interrupted 1 Vicryl, subcu  interrupted 2-0 Vicryl, subcuticular running 4-0 Monocryl. Incision was  cleaned and dried and sterile dressings applied. The patient was awakened and  transported to recovery in stable condition.   Dione Plover Charrie Mcconnon, MD    10/11/2022, 12:46 PM

## 2022-10-11 NOTE — Discharge Instructions (Signed)
Information on my medicine - ELIQUIS (apixaban)   Why was Eliquis prescribed for you? Eliquis was prescribed for you to reduce the risk of blood clots forming after orthopedic surgery.    What do You need to know about Eliquis? Take your Eliquis TWICE DAILY - one tablet in the morning and one tablet in the evening with or without food.  It would be best to take the dose about the same time each day.  If you have difficulty swallowing the tablet whole please discuss with your pharmacist how to take the medication safely.  Take Eliquis exactly as prescribed by your doctor and DO NOT stop taking Eliquis without talking to the doctor who prescribed the medication.  Stopping without other medication to take the place of Eliquis may increase your risk of developing a clot.  After discharge, you should have regular check-up appointments with your healthcare provider that is prescribing your Eliquis.  What do you do if you miss a dose? If a dose of ELIQUIS is not taken at the scheduled time, take it as soon as possible on the same day and twice-daily administration should be resumed.  The dose should not be doubled to make up for a missed dose.  Do not take more than one tablet of ELIQUIS at the same time.  Important Safety Information A possible side effect of Eliquis is bleeding. You should call your healthcare provider right away if you experience any of the following: Bleeding from an injury or your nose that does not stop. Unusual colored urine (red or dark [place]) or unusual colored stools (red or black). Unusual bruising for unknown reasons. A serious fall or if you hit your head (even if there is no bleeding).  Some medicines may interact with Eliquis and might increase your risk of bleeding or clotting while on Eliquis. To help avoid this, consult your healthcare provider or pharmacist prior to using any new prescription or non-prescription medications, including herbals,  vitamins, non-steroidal anti-inflammatory drugs (NSAIDs) and supplements.  This website has more information on Eliquis (apixaban): http://[email or web address].com/eliquis/home    Dr. Gaynelle Arabian Total Joint Specialist [facility] [address] [place], [place] [address] [phone]  [place]   Hip Rehabilitation, Guidelines Following Surgery   HOME CARE INSTRUCTIONS  Remove items at home which could result in a fall. This includes throw rugs or furniture in walking pathways.  ICE to the affected hip every three hours for 30 minutes at a time and then as needed for pain and swelling.  Continue to use ice on the hip for pain and swelling from surgery. You may notice swelling that will progress down to the foot and ankle.  This is normal after surgery.  Elevate the leg when you are not up walking on it.   Continue to use the breathing machine which will help keep your temperature down.  It is common for your temperature to cycle up and down following surgery, especially at night when you are not up moving around and exerting yourself.  The breathing machine keeps your lungs expanded and your temperature down.  DRESSING / WOUND CARE / SHOWERING You may shower 3 days after surgery, but keep the wounds dry during showering.  You may use an occlusive plastic wrap (Press'n Seal for example), NO SOAKING/SUBMERGING IN THE BATHTUB.  If the bandage gets wet, change with a clean dry gauze.  If the incision gets wet, pat the wound dry with a clean towel.  You may start showering once you are discharged home but do not submerge the incision under water. Just pat the incision dry and apply a dry gauze dressing on daily. Change the surgical dressing daily and reapply a dry dressing each time.  ACTIVITY Walk with your walker as instructed. Use walker as long as suggested by your caregivers. Avoid periods of inactivity such as sitting longer than an hour  when not asleep. This helps prevent blood clots.  Do not drive a car for 6 weeks or until released by you surgeon.  Do not drive while taking narcotics.  WEIGHT BEARING Weight bearing as tolerated with assist device (walker, cane, etc) as directed, use it as long as suggested by your surgeon or therapist, typically at least 4-6 weeks.  POST-OPERATIVE OPIOID TAPER INSTRUCTIONS: It is important to wean off of your opioid medication as soon as possible. If you do not need pain medication after your surgery it is ok to stop day one. Opioids include: Codeine, Hydrocodone(Norco, Vicodin), Oxycodone(Percocet, oxycontin) and hydromorphone amongst others.  Long term and even short term use of opiods can cause: Increased pain response Dependence Constipation Depression Respiratory depression And more.  Withdrawal symptoms can include Flu like symptoms Nausea, vomiting And more Techniques to manage these symptoms Hydrate well Eat regular healthy meals Stay active Use relaxation techniques(deep breathing, meditating, yoga) Do Not substitute Alcohol to help with tapering If you have been on opioids for less than two weeks and do not have pain than it is ok to stop all together.  Plan to wean off of opioids This plan should start within one week post op of your joint replacement. Maintain the same interval or time between taking each dose and first decrease the dose.  Cut the total daily intake of opioids by one tablet each day Next start to increase the time between doses. The last dose that should be eliminated is the evening dose.   MEDICATIONS See your medication summary on the "After Visit Summary" that the nursing staff will review with you prior to discharge.  You may have some home medications which will be placed on hold until you complete the course of blood thinner medication.  It is important for you to complete the blood thinner medication as prescribed by your surgeon.  Continue  your approved medications as instructed at time of discharge.                                                 FOLLOW-UP APPOINTMENTS Make sure you keep all of your appointments after your operation with your surgeon and caregivers. You should call the office at the above phone number and make an appointment for approximately two weeks after the date of your surgery or on the date instructed by your surgeon outlined in the "After Visit Summary".  IF YOU ARE TRANSFERRED TO A SKILLED REHAB FACILITY If the patient is transferred to a skilled rehab facility following release from the hospital, a list of the current medications will be sent to the facility for the patient to continue.  When discharged from the skilled rehab facility, please have the facility set up the patient's [place] prior to being released. Also, the skilled facility will be responsible for providing the patient with their medications at time of release from the facility to include their pain  medication, the muscle relaxants, and their blood thinner medication. If the patient is still at the rehab facility at time of the two week follow up appointment, the skilled rehab facility will also need to assist the patient in arranging follow up appointment in our office and any transportation needs.  MAKE SURE YOU:  Understand these instructions.  Get help right away if you are not doing well or get worse.   Do not submerge incision under water. Please use good hand washing techniques while changing dressing each day. May shower starting three days after surgery. Please use a clean towel to pat the incision dry following showers. Continue to use ice for pain and swelling after surgery. Do not use any lotions or creams on the incision until instructed by your surgeon.

## 2022-10-11 NOTE — Progress Notes (Signed)
PROGRESS NOTE    Paula Ward  JQB:[id number] DOB: 1936-08-12 DOA: 10/10/2022 PCP: Hoyt Koch, MD      Brief Narrative:   From admission h and p  Paula Ward is a 86 y.o. female with medical history significant of AMV, CKD, CREST syndrome, GERD, ILD, hx of PE s/p IVC, pulmonary HTN, chronic respiratory failure with 2L oxygen at night, hx of RCC who presented to ED after she fell and ahd left hip pain.  She was at [facility] and was pulling an item from a shelf and someone bumped into her, lost her balance and fell onto her left hip.  She could not get up. Denies any trauma to her head.  She is on no blood thinner or ASA.    She has been feeling good. Denies any fever/chills, vision changes/headaches, chest pain or palpitations, shortness of breath or cough, abdominal pain, N/V/D, dysuria or leg swelling.   Assessment & Plan:   Principal Problem:   Closed comminuted intertrochanteric fracture of left femur (HCC) Active Problems:   Essential hypertension   Pulmonary hypertension (HCC)   Chronic diastolic heart failure (HCC)   ILD (interstitial lung disease) (HCC)   Chronic respiratory failure with hypoxia (HCC)   Pericardial effusion   Moderate aortic stenosis   CKD (chronic kidney disease) stage 3, GFR 30-59 ml/min (HCC)   Anemia, chronic disease   PULMONARY EMBOLISM, HX OF   GASTROESOPHAGEAL REFLUX DISEASE   Adjustment disorder   SCLERODERMA   Closed comminuted intertrochanteric fracture of left femur (HCC) S/p intramedullary nailing with ortho today. - pain control, bowel regimen - trend hgb, transfuse for less than 8 - pharmacologic dvt ppx when ortho gives go ahead - pt/ot consults, will likely need snf - advance diet   Essential hypertension Here bp wnl - cont home dilt - hold home spiro/lasix given mild increase in cr   Pulmonary hypertension ([place]) -felt secondary to history of crest syndrome and interstitial lung disease as well as history of  pulmonary embolus.   -She is followed by pulmonary and at [place]  -Continue present medications    ILD (interstitial lung disease) ([place]) Followed by pulm and at [place] therapy has been discussed previously at her visits at [facility] and not initiated due to the side effect profile  Continue 2L [place] oxygen at night    Chronic respiratory failure with hypoxia (HCC) On 2L oxygen at night only secondary to ILD  stable   Moderate aortic stenosis Moderate by echo in May 02/2022  LHC/RHC in 04/2022 with moderate AS not candidate for TAVR at that time    Pericardial effusion Large pericardial effusion by echo in 02/2022 that surrounds heart. There is no evidence by echo criteria for hemodynamic compromise. Mitral inflow shows no signficant respiratory variation, there is no RV collapse, IVC is normal size    CKD (chronic kidney disease) stage 3, GFR 30-59 ml/min (HCC) Mild cr increase, will continue gentle fluids, continue to monitor    Anemia, chronic disease IV iron infusions q 6 weeks  Iron studies done 09/2022  Transfusion thresholds as above   PULMONARY EMBOLISM, HX OF History of IVC placed in 2012, will plan on lovenox ppx when ortho gives go ahead   GASTROESOPHAGEAL REFLUX DISEASE Continue protonix    Adjustment disorder - cont home citalopram  Macrocytosis - f/u b12/folate   DVT prophylaxis: SCDs, plan to start lovenox likely tomorrow Code Status: DNR Family Communication: husband and son updated @ bedside  Level of care: Med-Surg Status is: Inpatient     Consultants:  ortho  Procedures: Intramedullary nailing left intertrochanter femur fracture  Antimicrobials:  Surgical ppx    Subjective: Woozy after surgery, mild left hip pain  Objective: Vitals:   10/11/22 1330 10/11/22 1345 10/11/22 1400 10/11/22 1430  BP: (!) 146/52 (!) 145/48 (!) 136/48 (!) 122/53  Pulse: 86 87 86 91  Resp: _0 Temp:   97.7 F (36.5 C) (!) 97.3 F (36.3 C)   TempSrc:    Oral  SpO2: 95% 92% 96% 98%  Weight:      Height:        Intake/Output Summary (Last 24 hours) at 10/11/2022 1452 Last data filed at 10/11/2022 1315 Gross per 24 hour  Intake 750 ml  Output 600 ml  Net 150 ml   Filed Weights   10/11/22 0157  Weight: 49.2 kg    Examination:  General exam: Appears in mild pain Respiratory system: scattered rales Cardiovascular system: rrr, soft systolic murmur Gastrointestinal system: Abdomen is nondistended, soft and nontender. No organomegaly or masses felt. Normal bowel sounds heard. Central nervous system: Alert and oriented. No focal neurological deficits. Extremities: Symmetric 5 x 5 power. Skin: no edema. Small ulcer right lateral foot at base of mtp Psychiatry: Judgement and insight appear normal. Mood & affect appropriate.     Data Reviewed: I have personally reviewed following labs and imaging studies  CBC: Recent Labs  Lab 10/10/22 1828 10/11/22 0404  WBC 7.0 8.9  NEUTROABS 5.8  --   HGB 10.1* 8.7*  HCT 31.7* 27.2*  MCV 112.4* 112.4*  PLT 178 568   Basic Metabolic Panel: Recent Labs  Lab 10/10/22 1828 10/11/22 0404  NA 137 137  K 3.9 4.4  CL 102 104  CO2 26 26  GLUCOSE 148* 133*  BUN 37* 37*  CREATININE 1.38* 1.40*  CALCIUM 9.0 8.6*   GFR: Estimated Creatinine Clearance: 23.2 mL/min (A) (by C-G formula based on SCr of 1.4 mg/dL (H)). Liver Function Tests: Recent Labs  Lab 10/10/22 1828  AST 24  ALT 14  ALKPHOS 54  BILITOT 0.8  PROT 6.6  ALBUMIN 4.0   No results for input(s): "LIPASE", "AMYLASE" in the last 168 hours. No results for input(s): "AMMONIA" in the last 168 hours. Coagulation Profile: Recent Labs  Lab 10/10/22 1828 10/10/22 2346  INR 1.1 1.1   Cardiac Enzymes: No results for input(s): "CKTOTAL", "CKMB", "CKMBINDEX", "TROPONINI" in the last 168 hours. BNP (last 3 results) No results for input(s): "PROBNP" in the last 8760 hours. HbA1C: No results for input(s):  "HGBA1C" in the last 72 hours. CBG: No results for input(s): "GLUCAP" in the last 168 hours. Lipid Profile: No results for input(s): "CHOL", "HDL", "LDLCALC", "TRIG", "CHOLHDL", "LDLDIRECT" in the last 72 hours. Thyroid Function Tests: No results for input(s): "TSH", "T4TOTAL", "FREET4", "T3FREE", "THYROIDAB" in the last 72 hours. Anemia Panel: No results for input(s): "VITAMINB12", "FOLATE", "FERRITIN", "TIBC", "IRON", "RETICCTPCT" in the last 72 hours. Urine analysis:    Component Value Date/Time   COLORURINE YELLOW 03/30/2021 [place] 03/30/2021 1416   LABSPEC 1.013 03/30/2021 1416   PHURINE 5.0 03/30/2021 1416   GLUCOSEU NEGATIVE 03/30/2021 [place] 01/10/2021 0958   HGBUR NEGATIVE 03/30/2021 1416   BILIRUBINUR negative 07/16/2022 1658   BILIRUBINUR Neg 09/03/2017 1529   KETONESUR negative 07/16/2022 [place] 03/30/2021 1416   PROTEINUR negative 07/16/2022 1658   PROTEINUR NEGATIVE  03/30/2021 1416   UROBILINOGEN 0.2 07/16/2022 1658   UROBILINOGEN 0.2 01/10/2021 0958   NITRITE Negative 07/16/2022 1658   NITRITE NEGATIVE 03/30/2021 1416   LEUKOCYTESUR Negative 07/16/2022 1658   LEUKOCYTESUR NEGATIVE 03/30/2021 1416   Sepsis Labs: _0 (procalcitonin:4,lacticidven:4)  )No results found for this or any previous visit (from the past 240 hour(s)).       Radiology Studies: DG HIP UNILAT WITH PELVIS 1V LEFT  Result Date: 10/11/2022 CLINICAL DATA:  Fluoroscopic assistance for internal fixation EXAM: DG HIP (WITH OR WITHOUT PELVIS) 1V*L* COMPARISON:  10/10/2022 FINDINGS: Fluoroscopic images show internal fixation of comminuted intertrochanteric fracture of proximal left femur with intramedullary rod. Fluoroscopy time 24 seconds. Radiation dose 5.29 mGy. IMPRESSION: Fluoroscopic assistance was provided for internal fixation of fracture of proximal left femur. Electronically Signed   By: Elmer Picker M.D.   On:  10/11/2022 13:03   DG C-Arm 1-60 Min-No Report  Result Date: 10/11/2022 Fluoroscopy was utilized by the requesting physician.  No radiographic interpretation.   DG Chest Port 1 View  Result Date: 10/10/2022 CLINICAL DATA:  Fall, chest injury EXAM: PORTABLE CHEST 1 VIEW COMPARISON:  07/28/2022 FINDINGS: Opacification of the upper lung zones bilaterally likely relates to overlying breast tissue. No pneumothorax or pleural effusion. Moderate cardiomegaly is stable. No acute bone abnormality. Surgical clips are seen within the left breast and axilla. IMPRESSION: 1. No active disease. 2. Stable cardiomegaly. Electronically Signed   By: Fidela Salisbury M.D.   On: 10/10/2022 19:17   DG Pelvis Portable  Result Date: 10/10/2022 CLINICAL DATA:  Fall at [place] 1-2 VIEWS; LEFT FEMUR PORTABLE 2 VIEWS COMPARISON:  CT abdomen and pelvis 04/04/2019 FINDINGS: Acute comminuted intertrochanteric fracture of the left proximal femur. There is superior displacement of the fracture. Mildly displaced greater and lesser trochanters. The femoral head remains located within the acetabulum though appears rotated superiorly. No additional fractures. IMPRESSION: Comminuted left intertrochanteric fracture. Electronically Signed   By: Placido Sou M.D.   On: 10/10/2022 19:06   DG Femur Portable Min 2 Views Left  Result Date: 10/10/2022 CLINICAL DATA:  Fall at [place]: PORTABLE PELVIS 1-2 VIEWS; LEFT FEMUR PORTABLE 2 VIEWS COMPARISON:  CT abdomen and pelvis 04/04/2019 FINDINGS: Acute comminuted intertrochanteric fracture of the left proximal femur. There is superior displacement of the fracture. Mildly displaced greater and lesser trochanters. The femoral head remains located within the acetabulum though appears rotated superiorly. No additional fractures. IMPRESSION: Comminuted left intertrochanteric fracture. Electronically Signed   By: Placido Sou M.D.   On: 10/10/2022 19:06         Scheduled Meds:  acetaminophen  500 mg Oral Q6H   [START ON 10/12/2022] apixaban  2.5 mg Oral BID   citalopram  5 mg Oral Daily   [START ON 10/12/2022] diltiazem  120 mg Oral q morning   docusate sodium  100 mg Oral BID   fluticasone  1 spray Each Nare Daily   furosemide  20 mg Oral Daily   guaiFENesin  600 mg Oral Daily   ipratropium  2 spray Each Nare Q12H   latanoprost  1 drop Both Eyes QHS   macitentan  10 mg Oral Daily   montelukast  10 mg Oral QHS   [START ON 10/12/2022] multivitamin with minerals  1 tablet Oral Daily   pantoprazole  40 mg Oral BID AC   spironolactone  12.5 mg Oral Daily   Continuous Infusions:  sodium chloride      ceFAZolin (ANCEF) IV  methocarbamol (ROBAXIN) IV       LOS: 1 day     Desma Maxim, MD [facility]   If 7PM-7AM, please contact night-coverage [email or web address] Password [facility] 10/11/2022, 2:52 PM

## 2022-10-11 NOTE — Transfer of Care (Signed)
Immediate Anesthesia Transfer of Care Note  Patient: DNIYA NEUHAUS  Procedure(s) Performed: INTRAMEDULLARY (IM) NAIL INTERTROCHANTERIC (Left: Hip)  Patient Location: PACU  Anesthesia Type:General  Level of Consciousness: awake, alert , and oriented  Airway & Oxygen Therapy: Patient Spontanous Breathing and Patient connected to face mask oxygen  Post-op Assessment: Report given to RN and Post -op Vital signs reviewed and stable  Post vital signs: Reviewed and stable  Last Vitals:  Vitals Value Taken Time  BP    Temp    Pulse 90 10/11/22 1314  Resp 15 10/11/22 1314  SpO2 95 % 10/11/22 1314  Vitals shown include unvalidated device data.  Last Pain:  Vitals:   10/11/22 1140  TempSrc: Oral  PainSc:          Complications: No notable events documented.

## 2022-10-11 NOTE — Progress Notes (Signed)
Initial Nutrition Assessment  INTERVENTION:   -Ensure Plus High Protein po BID, each supplement provides 350 kcal and 20 grams of protein.   -Multivitamin with minerals daily  NUTRITION DIAGNOSIS:   Increased nutrient needs related to post-op healing, hip fracture as evidenced by estimated needs.  GOAL:   Patient will meet greater than or equal to 90% of their needs  MONITOR:   PO intake, Supplement acceptance, Diet advancement, Labs, Weight trends, I & O's  REASON FOR ASSESSMENT:   Consult Hip fracture protocol  ASSESSMENT:   86 y.o. female with medical history significant of AMV, CKD, CREST syndrome, GERD, ILD, hx of PE s/p IVC, pulmonary HTN, chronic respiratory failure with 2L oxygen at night, hx of RCC who presented to ED after she fell and ahd left hip pain. Admitted for left hip fracture.  Patient in OR for hip surgery. NPO. Will order Ensure supplements to aid in post-op healing.  Per weight records, pt's weight has been stable.  Medications: Lasix  Labs reviewed.  NUTRITION - FOCUSED PHYSICAL EXAM:  Unable to complete, working remotely. Pt in OR.  Diet Order:   Diet Order             Diet NPO time specified  Diet effective midnight                   EDUCATION NEEDS:   No education needs have been identified at this time  Skin:  Skin Assessment: Reviewed RN Assessment  Last BM:  12/21  Height:   Ht Readings from Last 1 Encounters:  10/11/22 5' 2.99" (1.6 m)    Weight:   Wt Readings from Last 1 Encounters:  10/11/22 49.2 kg    BMI:  Body mass index is 19.22 kg/m.  Estimated Nutritional Needs:   Kcal:  1300-1500  Protein:  65-75g  Fluid:  1.5L/day  Clayton Bibles, MS, RD, LDN Inpatient Clinical Dietitian Contact information available via Amion

## 2022-10-12 DIAGNOSIS — I5032 Chronic diastolic (congestive) heart failure: Secondary | ICD-10-CM | POA: Diagnosis not present

## 2022-10-12 DIAGNOSIS — J849 Interstitial pulmonary disease, unspecified: Secondary | ICD-10-CM

## 2022-10-12 DIAGNOSIS — J9611 Chronic respiratory failure with hypoxia: Secondary | ICD-10-CM

## 2022-10-12 LAB — CBC
HCT: 23.5 % — ABNORMAL LOW (ref 36.0–46.0)
Hemoglobin: 7.4 g/dL — ABNORMAL LOW (ref 12.0–15.0)
MCH: 36.3 pg — ABNORMAL HIGH (ref 26.0–34.0)
MCHC: 31.5 g/dL (ref 30.0–36.0)
MCV: 115.2 fL — ABNORMAL HIGH (ref 80.0–100.0)
Platelets: 161 10*3/uL (ref 150–400)
RBC: 2.04 MIL/uL — ABNORMAL LOW (ref 3.87–5.11)
RDW: 13.9 % (ref 11.5–15.5)
WBC: 9.2 10*3/uL (ref 4.0–10.5)
nRBC: 0 % (ref 0.0–0.2)

## 2022-10-12 LAB — BASIC METABOLIC PANEL
Anion gap: 9 (ref 5–15)
BUN: 45 mg/dL — ABNORMAL HIGH (ref 8–23)
CO2: 25 mmol/L (ref 22–32)
Calcium: 8.2 mg/dL — ABNORMAL LOW (ref 8.9–10.3)
Chloride: 103 mmol/L (ref 98–111)
Creatinine, Ser: 1.52 mg/dL — ABNORMAL HIGH (ref 0.44–1.00)
GFR, Estimated: 34 mL/min — ABNORMAL LOW (ref >60)
Glucose, Bld: 112 mg/dL — ABNORMAL HIGH (ref 70–99)
Potassium: 4.9 mmol/L (ref 3.5–5.1)
Sodium: 137 mmol/L (ref 135–145)

## 2022-10-12 LAB — PREPARE RBC (CROSSMATCH)

## 2022-10-12 MED ORDER — DICLOFENAC SODIUM 1 % EX GEL
4.0000 g | Freq: Four times a day (QID) | CUTANEOUS | Status: DC
  Administered 2022-10-12 – 2022-10-15 (×11): 4 g via TOPICAL
  Filled 2022-10-12: qty 100

## 2022-10-12 MED ORDER — SODIUM CHLORIDE 0.9% IV SOLUTION: Freq: Once | INTRAVENOUS | Status: AC

## 2022-10-12 MED ORDER — ACETAMINOPHEN 325 MG PO TABS
650.0000 mg | ORAL_TABLET | Freq: Four times a day (QID) | ORAL | Status: DC | PRN
  Administered 2022-10-12 – 2022-10-14 (×6): 650 mg via ORAL
  Filled 2022-10-12 (×6): qty 2

## 2022-10-12 NOTE — Progress Notes (Signed)
Subjective: 1 Day Post-Op Procedure(s) (LRB): INTRAMEDULLARY (IM) NAIL INTERTROCHANTERIC (Left) Patient reports pain as mild.  Got up into chair and had minimal pain Is having some lightheadedness  Objective: Vital signs in last 24 hours: Temp:  [97.3 F (36.3 C)-99 F (37.2 C)] 98.4 F (36.9 C) (12/24 0100) Pulse Rate:  [86-99] 99 (12/24 0100) Resp:  [12-25] 18 (12/23 2200) BP: (102-162)/(41-61) 118/54 (12/24 0100) SpO2:  [92 %-98 %] 98 % (12/24 0100)  Intake/Output from previous day: 12/23 0701 - 12/24 0700 In: 1558.8 [P.O.:120; I.V.:1338.8; IV Piggyback:100] Out: 450 [Urine:400; Blood:50] Intake/Output this shift: Total I/O In: 688.8 [I.V.:588.8; IV Piggyback:100] Out: 400 [Urine:400]  Recent Labs    10/10/22 1828 10/11/22 0404 10/12/22 0348  HGB 10.1* 8.7* 7.4*   Recent Labs    10/11/22 0404 10/12/22 0348  WBC 8.9 9.2  RBC 2.42* 2.04*  HCT 27.2* 23.5*  PLT 168 161   Recent Labs    10/11/22 0404 10/12/22 0348  NA 137 137  K 4.4 4.9  CL 104 103  CO2 26 25  BUN 37* 45*  CREATININE 1.40* 1.52*  GLUCOSE 133* 112*  CALCIUM 8.6* 8.2*   Recent Labs    10/10/22 1828 10/10/22 2346  INR 1.1 1.1    Neurologically intact Neurovascular intact No cellulitis present Compartment soft Dressing C/D/I   Assessment/Plan: 1 Day Post-Op Procedure(s) (LRB): INTRAMEDULLARY (IM) NAIL INTERTROCHANTERIC (Left) Advance diet Up with therapy Plan transfuse 2 units PRBCs for ABLA due to fracture Eliquis 2.5 mg po bid x 3 weeks for DVT prophylaxis      Gaynelle Arabian 10/12/2022, 6:34 AM

## 2022-10-12 NOTE — Plan of Care (Signed)

## 2022-10-12 NOTE — TOC Initial Note (Signed)
Transition of Care [facility]) - Initial/Assessment Note    Patient Details  Name: Paula Ward MRN: [id number] Date of Birth: 1936/09/07  Transition of Care [facility]) CM/SW Contact:    Lennart Pall, LCSW Phone Number: 10/12/2022, 9:07 AM  Clinical Narrative:                 Met with pt to introduce TOC/ CSW role with dc planning.  Pt admittedly frustrated with situation overall.  Confirms she lives with her elderly spouse (67yo) and was independent PTA.  She does feel that she will need SNF for rehab after this hospitalization.  We discussed preferred facilities.  Will go ahead and prep FL2 and send out, however, anticipate no forward movement until after holiday.  Expected Discharge Plan: Skilled Nursing Facility Barriers to Discharge: Continued Medical Work up, Ship broker   Patient Goals and CMS Choice Patient states their goals for this hospitalization and ongoing recovery are:: return home following rehab   Choice offered to / list presented to : Patient      Expected Discharge Plan and Services In-house Referral: Clinical Social Work   Post Acute Care Choice: [place] Living arrangements for the past 2 months: Single Family Home                 DME Arranged: N/A DME Agency: NA                  Prior Living Arrangements/Services Living arrangements for the past 2 months: Single Family Home Lives with:: Spouse Patient language and need for interpreter reviewed:: Yes Do you feel safe going back to the place where you live?: Yes      Need for Family Participation in Patient Care: No (Comment) Care giver support system in place?: Yes (comment)   Criminal Activity/Legal Involvement Pertinent to Current Situation/Hospitalization: No - Comment as needed  Activities of Daily Living Home Assistive Devices/Equipment: None ADL Screening (condition at time of admission) Patient's cognitive ability adequate to safely complete daily activities?:  Yes Is the patient deaf or have difficulty hearing?: No Does the patient have difficulty seeing, even when wearing glasses/contacts?: No Does the patient have difficulty concentrating, remembering, or making decisions?: No Patient able to express need for assistance with ADLs?: No Does the patient have difficulty dressing or bathing?: No Independently performs ADLs?: Yes (appropriate for developmental age) Does the patient have difficulty walking or climbing stairs?: Yes Weakness of Legs: Left Weakness of Arms/Hands: Left  Permission Sought/Granted Permission sought to share information with : Family Supports, Chartered certified accountant granted to share information with : Yes, Verbal Permission Granted  Share Information with NAME: Paula Ward  Permission granted to share info w AGENCY: SNFs  Permission granted to share info w Relationship: spouse  Permission granted to share info w Contact Information: [phone]  Emotional Assessment Appearance:: Appears stated age Attitude/Demeanor/Rapport: Engaged, Gracious Affect (typically observed): Accepting, Pleasant Orientation: : Oriented to Self, Oriented to Place, Oriented to  Time, Oriented to Situation Alcohol / Substance Use: Not Applicable Psych Involvement: No (comment)  Admission diagnosis:  Closed displaced intertrochanteric fracture of left femur, initial encounter ([place]) [S72.142A] Closed comminuted intertrochanteric fracture of left femur ([place]) [S72.142A] Patient Active Problem List   Diagnosis Date Noted   Closed comminuted intertrochanteric fracture of left femur ([place]) 10/10/2022   Chronic respiratory failure with hypoxia ([place]) 10/10/2022   Adjustment disorder 10/02/2022   Moderate aortic stenosis 04/26/2022   Aortic stenosis, moderate 10/29/2021   Generalized  weakness 03/29/2021   Chronic diastolic heart failure ([place]) 03/29/2021   AVM (arteriovenous malformation) of small bowel, acquired    Anemia,  chronic disease 12/25/2020   Globus pharyngeus 08/19/2019   Dry mouth 02/15/2018   Allergic rhinitis 11/05/2017   Cerebellar 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 ([place]) 10/26/2017   Pericardial effusion 01/21/2016   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[place])    Osteoarthritis 02/25/2015   History of pulmonary embolism 01/29/2013   CKD (chronic kidney disease) stage 3, GFR 30-59 ml/min (HCC) 09/01/2012   RECTAL INCONTINENCE 03/27/2009   Angiodysplasia of stomach and duodenum 08/22/2008   ADENOCARCINOMA, BREAST, LEFT 07/26/2007   Anemia, iron deficiency 07/26/2007   Essential hypertension 07/26/2007   Pulmonary hypertension ([place]) 07/26/2007   GASTROESOPHAGEAL REFLUX DISEASE 07/26/2007   SCLERODERMA 07/26/2007   ILD (interstitial lung disease) ([place]) 07/26/2007   PULMONARY EMBOLISM, HX OF 07/26/2007   PCP:  Hoyt Koch, MD Pharmacy:   [facility] [address] [place] [address] Phone: [phone] Fax: [place] [address] [place] [address] Phone: [phone] Fax: [phone]  [place], [place] [address] Phone: [phone] Fax: [phone]     Social Determinants of Health (SDOH) Social History: [place]: No Food Insecurity (10/11/2022)  Housing: Low Risk  (10/11/2022)  Transportation Needs: No Transportation Needs (10/11/2022)  Utilities: Not At Risk (10/11/2022)  Alcohol Screen: Low Risk  (04/02/2022)  Depression (PHQ2-9): Low Risk  (09/29/2022)  Financial Resource Strain: Low Risk  (04/02/2022)  Physical Activity: Inactive (04/02/2022)  Social Connections: Socially Integrated (04/02/2022)  Stress: No Stress Concern Present (04/02/2022)  Tobacco Use: Low Risk  (10/10/2022)   SDOH Interventions:     Readmission Risk  Interventions    10/12/2022    9:05 AM 03/18/2022    3:45 PM  Readmission Risk Prevention Plan  Transportation Screening Complete Complete  PCP or Specialist Appt within 3-5 Days  Complete  HRI or [place]  Complete  Social Work Consult for [place] Planning/Counseling  Complete  Palliative Care Screening  Not Applicable  Medication Review Press photographer) Complete Complete  PCP or Specialist appointment within 3-5 days of discharge Complete   HRI or [place] Complete   SW Recovery Care/Counseling Consult Complete   Palliative Care Screening Not Applicable   Skilled Nursing Facility Complete

## 2022-10-12 NOTE — NC FL2 (Signed)
[place] LEVEL OF CARE FORM     IDENTIFICATION  Patient Name: Paula Ward Birthdate: 1936-03-28 Sex: female Admission Date (Current Location): 10/10/2022  [facility] and [place] Number:  Herbalist and Address:  [facility] [place],  [place], [place]      Provider Number: [id number]  Attending Physician Name and Address:  Ezekiel Slocumb, DO  Relative Name and Phone Number:  [place], [place] [phone]    Current Level of Care: Hospital Recommended Level of Care: [place] Prior Approval Number:    Date Approved/Denied:   PASRR Number: [id number]  Discharge Plan: Home    Current Diagnoses: Patient Active Problem List   Diagnosis Date Noted   Closed comminuted intertrochanteric fracture of left femur ([place]) 10/10/2022   Chronic respiratory failure with hypoxia ([place]) 10/10/2022   Adjustment disorder 10/02/2022   Moderate aortic stenosis 04/26/2022   Aortic stenosis, moderate 10/29/2021   Generalized weakness 03/29/2021   Chronic diastolic heart failure ([place]) 03/29/2021   AVM (arteriovenous malformation) of small bowel, acquired    Anemia, chronic disease 12/25/2020   Globus pharyngeus 08/19/2019   Dry mouth 02/15/2018   Allergic rhinitis 11/05/2017   Cerebellar 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 ([place]) 10/26/2017   Pericardial effusion 01/21/2016   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[place])    Osteoarthritis 02/25/2015   History of pulmonary embolism 01/29/2013   CKD (chronic kidney disease) stage 3, GFR 30-59 ml/min ([place]) 09/01/2012   RECTAL INCONTINENCE 03/27/2009   Angiodysplasia of stomach and duodenum 08/22/2008   ADENOCARCINOMA, BREAST, LEFT 07/26/2007   Anemia, iron deficiency 07/26/2007   Essential hypertension 07/26/2007   Pulmonary hypertension ([place]) 07/26/2007   GASTROESOPHAGEAL REFLUX DISEASE 07/26/2007   SCLERODERMA 07/26/2007   ILD (interstitial lung disease) ([place]) 07/26/2007    PULMONARY EMBOLISM, HX OF 07/26/2007    Orientation RESPIRATION BLADDER Height & Weight     Self, Time, Situation, Place  Normal Continent, External catheter (currently with purewick) Weight: 108 lb 7.5 oz (49.2 kg) Height:  5' 2.99" (160 cm)  BEHAVIORAL SYMPTOMS/MOOD NEUROLOGICAL BOWEL NUTRITION STATUS      Continent Diet (Regular)  AMBULATORY STATUS COMMUNICATION OF NEEDS Skin   Limited Assist Verbally Other (Comment) (incisions site only)                       Personal Care Assistance Level of Assistance  Bathing, Dressing Bathing Assistance: Limited assistance   Dressing Assistance: Limited assistance     Functional Limitations Info  Sight, Hearing, Speech Sight Info: Adequate Hearing Info: Adequate Speech Info: Adequate    SPECIAL CARE FACTORS FREQUENCY  PT (By licensed PT), OT (By licensed OT)     PT Frequency: 5x/wk OT Frequency: 5x/wk            Contractures Contractures Info: Not present    Additional Factors Info  Code Status, Allergies, Psychotropic Code Status Info: DNR Allergies Info: Codeine, Other, Erythromycin, Iodinated Contrast Media, Lisinopril, Mycophenolate Mofetil, Sulfa Antibiotics Psychotropic Info: see MAR         Current Medications (10/12/2022):  This is the current hospital active medication list Current Facility-Administered Medications  Medication Dose Route Frequency Provider Last Rate Last Admin   0.9 %  sodium chloride infusion (Manually program via Guardrails IV Fluids)   Intravenous Once Gaynelle Arabian, MD       0.9 %  sodium chloride infusion   Intravenous Continuous Gaynelle Arabian, MD 75 mL/hr at 10/11/22  1909 New Bag at 10/11/22 1909   acetaminophen (TYLENOL) tablet 500 mg  500 mg Oral Q6H Aluisio, Pilar Plate, MD   500 mg at 10/12/22 0650   albuterol (PROVENTIL) (2.5 MG/3ML) 0.083% nebulizer solution 2.5 mg  2.5 mg Nebulization Q6H PRN Gaynelle Arabian, MD       apixaban Arne [place]) tablet 2.5 mg  2.5 mg Oral BID Gaynelle Arabian, MD       citalopram (CELEXA) tablet 5 mg  5 mg Oral Daily Aluisio, Pilar Plate, MD       diltiazem (CARDIZEM CD) 24 hr capsule 120 mg  120 mg Oral q morning Aluisio, Pilar Plate, MD       docusate sodium (COLACE) capsule 100 mg  100 mg Oral BID Gaynelle Arabian, MD   100 mg at 10/11/22 2213   fluticasone (FLONASE) 50 MCG/ACT nasal spray 1 spray  1 spray Each Nare Daily Aluisio, Pilar Plate, MD       guaiFENesin (MUCINEX) 12 hr tablet 600 mg  600 mg Oral Daily Aluisio, Pilar Plate, MD       HYDROcodone-acetaminophen (NORCO/VICODIN) 5-325 MG per tablet 1-2 tablet  1-2 tablet Oral Q4H PRN Gaynelle Arabian, MD       ipratropium (ATROVENT) 0.03 % nasal spray 2 spray  2 spray Each Nare Q12H Gaynelle Arabian, MD   2 spray at 10/11/22 2221   latanoprost (XALATAN) 0.005 % ophthalmic solution 1 drop  1 drop Both Eyes QHS Aluisio, Pilar Plate, MD   1 drop at 10/11/22 2214   macitentan (OPSUMIT) tablet 10 mg  10 mg Oral Daily Aluisio, Pilar Plate, MD       menthol-cetylpyridinium (CEPACOL) lozenge 3 mg  1 lozenge Oral PRN Gaynelle Arabian, MD       Or   phenol (CHLORASEPTIC) mouth spray 1 spray  1 spray Mouth/Throat PRN Gaynelle Arabian, MD   1 spray at 10/11/22 1909   methocarbamol (ROBAXIN) tablet 500 mg  500 mg Oral Q6H PRN Gaynelle Arabian, MD   500 mg at 10/11/22 1118   Or   methocarbamol (ROBAXIN) 500 mg in dextrose 5 % 50 mL IVPB  500 mg Intravenous Q6H PRN Aluisio, Pilar Plate, MD       metoCLOPramide (REGLAN) tablet 5-10 mg  5-10 mg Oral Q8H PRN Gaynelle Arabian, MD       Or   metoCLOPramide (REGLAN) injection 5-10 mg  5-10 mg Intravenous Q8H PRN Aluisio, Pilar Plate, MD       montelukast (SINGULAIR) tablet 10 mg  10 mg Oral QHS Gaynelle Arabian, MD   10 mg at 10/11/22 2213   morphine (PF) 2 MG/ML injection 0.5-1 mg  0.5-1 mg Intravenous Q2H PRN Gaynelle Arabian, MD       multivitamin with minerals tablet 1 tablet  1 tablet Oral Daily Aluisio, Pilar Plate, MD       ondansetron [facility]) tablet 4 mg  4 mg Oral Q6H PRN Gaynelle Arabian, MD       Or   ondansetron  (ZOFRAN) injection 4 mg  4 mg Intravenous Q6H PRN Aluisio, Pilar Plate, MD       pantoprazole (PROTONIX) EC tablet 40 mg  40 mg Oral BID AC Gaynelle Arabian, MD   40 mg at 10/11/22 1909   polyethylene glycol (MIRALAX / GLYCOLAX) packet 17 g  17 g Oral Daily Wouk, Ailene Rud, MD       senna [facility]) tablet 8.6 mg  1 tablet Oral Daily Wouk, Ailene Rud, MD         Discharge Medications: Please see discharge summary  for a list of discharge medications.  Relevant Imaging Results:  Relevant Lab Results:   Additional Information SS# [id number]  Lennart Pall, LCSW

## 2022-10-12 NOTE — Evaluation (Signed)
Physical Therapy Evaluation Patient Details Name: Paula Ward MRN: [id number] DOB: July 01, 1936 Today's Date: 10/12/2022  History of Present Illness  Patient is a 86 year old female who s/p fall with L hip fx and now s/p IM nail.  pt with hx of Breast CA, AVM, CKD, PE s/p IVC, pulmonary HTN, Sceroderma, interstitial lung disease and chronic respiratory failure on 2L O2 at night  Clinical Impression  Pt admitted as above and presenting with functional mobility limitations 2* decreased L LE strength/ROM, post op pain and limited endurance.  Pt very motivated but reports min assist at home and could benefit from follow up rehab at SNF level to maximize IND and safety - dependent on acute stay progress.     Recommendations for follow up therapy are one component of a multi-disciplinary discharge planning process, led by the attending physician.  Recommendations may be updated based on patient status, additional functional criteria and insurance authorization.  Follow Up Recommendations Skilled nursing-short term rehab (<3 hours/day) (vs HHPT pending progress) Can patient physically be transported by private vehicle: Yes    Assistance Recommended at Discharge Frequent or constant Supervision/Assistance  Patient can return home with the following  A little help with walking and/or transfers;A little help with bathing/dressing/bathroom;Assistance with cooking/housework;Assist for transportation;Help with stairs or ramp for entrance    Equipment Recommendations Rolling walker (2 wheels)  Recommendations for Other Services       Functional Status Assessment Patient has had a recent decline in their functional status and demonstrates the ability to make significant improvements in function in a reasonable and predictable amount of time.     Precautions / Restrictions Precautions Precautions: Fall Restrictions Weight Bearing Restrictions: No LLE Weight Bearing: Weight bearing as tolerated       Mobility  Bed Mobility Overal bed mobility: Needs Assistance Bed Mobility: Supine to Sit     Supine to sit: Min assist, Mod assist     General bed mobility comments: cues for sequence with assist to manage L LE and to bring trunk to upright    Transfers Overall transfer level: Needs assistance Equipment used: Rolling walker (2 wheels) Transfers: Sit to/from Stand Sit to Stand: Min assist, From elevated surface           General transfer comment: cues for LE management and use of UEs to self assist.  Physical assist to bring wt up and fwd and to balance in initial standing with RW    Ambulation/Gait Ambulation/Gait assistance: Min assist Gait Distance (Feet): 7 Feet Assistive device: Rolling walker (2 wheels) Gait Pattern/deviations: Step-to pattern, Decreased step length - right, Decreased step length - left, Shuffle, Trunk flexed Gait velocity: decr     General Gait Details: cues for sequence, posture and position from RW; distance ltd by fatigue  Stairs            Wheelchair Mobility    Modified Rankin (Stroke Patients Only)       Balance Overall balance assessment: Mild deficits observed, not formally tested                                           Pertinent Vitals/Pain Pain Assessment Pain Assessment: 0-10 Pain Score: 2  Pain Location: L hip Pain Descriptors / Indicators: Aching, Sore Pain Intervention(s): Limited activity within patient's tolerance, Monitored during session, Premedicated before session, Ice applied    Home  Living Family/patient expects to be discharged to:: Private residence Living Arrangements: Spouse/significant other Available Help at Discharge: Available 24 hours/day;Family Type of Home: House Home Access: Stairs to enter Entrance Stairs-Rails: Right Entrance Stairs-Number of Steps: 3   Home Layout: One level [place]: Grab bars - tub/shower;Cane - single point;Other  (comment) Additional Comments: Home O2 used mostly at night but used sometimes during the day with exertion.    Prior Function Prior Level of Function : Independent/Modified Independent             Mobility Comments: uses cane as needed       Hand Dominance   Dominant Hand: Right    Extremity/Trunk Assessment   Upper Extremity Assessment Upper Extremity Assessment: Overall WFL for tasks assessed (noted to have some ulnar deviation on R hand. still WLF for tasks)    Lower Extremity Assessment Lower Extremity Assessment: Defer to PT evaluation    Cervical / Trunk Assessment Cervical / Trunk Assessment: Normal  Communication      Cognition Arousal/Alertness: Awake/alert Behavior During Therapy: WFL for tasks assessed/performed Overall Cognitive Status: Within Functional Limits for tasks assessed                                          General Comments      Exercises     Assessment/Plan    PT Assessment Patient needs continued PT services  PT Problem List Decreased strength;Decreased range of motion;Decreased activity tolerance;Decreased balance;Decreased mobility;Decreased knowledge of use of DME;Pain       PT Treatment Interventions DME instruction;Gait training;Stair training;Functional mobility training;Therapeutic activities;Therapeutic exercise;Patient/family education    PT Goals (Current goals can be found in the Care Plan section)  Acute Rehab PT Goals Patient Stated Goal: regain IND PT Goal Formulation: With patient Time For Goal Achievement: 10/26/22 Potential to Achieve Goals: Good    Frequency Min 5X/week     Co-evaluation   Reason for Co-Treatment: To address functional/ADL transfers PT goals addressed during session: Mobility/safety with mobility OT goals addressed during session: ADL's and self-care       AM-PAC PT "6 Clicks" Mobility  Outcome Measure Help needed turning from your back to your side while in a flat  bed without using bedrails?: A Little Help needed moving from lying on your back to sitting on the side of a flat bed without using bedrails?: A Little Help needed moving to and from a bed to a chair (including a wheelchair)?: A Little Help needed standing up from a chair using your arms (e.g., wheelchair or bedside chair)?: A Little Help needed to walk in hospital room?: Total Help needed climbing 3-5 steps with a railing? : Total 6 Click Score: 14    End of Session Equipment Utilized During Treatment: Gait belt Activity Tolerance: Patient limited by fatigue Patient left: in chair;with call bell/phone within reach;with chair alarm set Nurse Communication: Mobility status PT Visit Diagnosis: Muscle weakness (generalized) (M62.81);Difficulty in walking, not elsewhere classified (R26.2)    Time: 3903-0092 PT Time Calculation (min) (ACUTE ONLY): 23 min   Charges:   PT Evaluation $PT Eval Low Complexity: 1 Low          [employer] Pager [phone] Office [phone]   Adelai Achey 10/12/2022, 12:29 PM

## 2022-10-12 NOTE — Evaluation (Signed)
Occupational Therapy Evaluation Patient Details Name: Paula Ward MRN: [id number] DOB: Jan 26, 1936 Today's Date: 10/12/2022   History of Present Illness Patient is a 86 year old female who s/p fall with L hip fx and now s/p IM nail.  pt with hx of Breast CA, AVM, CKD, PE s/p IVC, pulmonary HTN, Sceroderma, interstitial lung disease and chronic respiratory failure on 2L O2 at night   Clinical Impression   Patient is a 86 year old female who was admitted for above. Patient was living at home with husband prior level. Patient reports that husband is unable to physically assist her with ADLs or mobility at home. Patient was noted to have decreased functional activity tolerance, decreased endurance, decreased standing balance, decreased safety awareness, and decreased knowledge of AD/AE impacting participation in ADLs. Patient would continue to benefit from skilled OT services at this time while admitted and after d/c to address noted deficits in order to improve overall safety and independence in ADLs.        Recommendations for follow up therapy are one component of a multi-disciplinary discharge planning process, led by the attending physician.  Recommendations may be updated based on patient status, additional functional criteria and insurance authorization.   Follow Up Recommendations  Skilled nursing-short term rehab (<3 hours/day) (v.s. HH pending progress)     Assistance Recommended at Discharge Intermittent Supervision/Assistance  Patient can return home with the following A little help with walking and/or transfers;Assistance with cooking/housework;Direct supervision/assist for medications management;Assist for transportation;Help with stairs or ramp for entrance;Direct supervision/assist for financial management;A lot of help with bathing/dressing/bathroom    Functional Status Assessment  Patient has had a recent decline in their functional status and demonstrates the ability to  make significant improvements in function in a reasonable and predictable amount of time.  Equipment Recommendations  Other (comment) (RW)    Recommendations for Other Services       Precautions / Restrictions Precautions Precautions: Fall Restrictions Weight Bearing Restrictions: No LLE Weight Bearing: Weight bearing as tolerated      Mobility Bed Mobility Overal bed mobility: Needs Assistance Bed Mobility: Supine to Sit     Supine to sit: Min assist, Mod assist     General bed mobility comments: cues for sequence with assist to manage L LE and to bring trunk to upright    Transfers Overall transfer level: Needs assistance Equipment used: Rolling walker (2 wheels) Transfers: Sit to/from Stand Sit to Stand: Min assist, From elevated surface           General transfer comment: cues for LE management and use of UEs to self assist.  Physical assist to bring wt up and fwd and to balance in initial standing with RW      Balance Overall balance assessment: Mild deficits observed, not formally tested                                         ADL either performed or assessed with clinical judgement   ADL Overall ADL's : Needs assistance/impaired Eating/Feeding: Set up;Sitting   Grooming: Set up;Sitting;Brushing hair Grooming Details (indicate cue type and reason): in recliner with mirror on table Upper Body Bathing: Set up;Sitting   Lower Body Bathing: Moderate assistance;Sitting/lateral leans   Upper Body Dressing : Set up;Sitting   Lower Body Dressing: Moderate assistance;Sitting/lateral leans   Toilet Transfer: Minimal assistance;Ambulation;Rolling walker (2 wheels) Armed forces technical officer  Details (indicate cue type and reason): with increased time. patient was educated on WB status this AM. Toileting- Clothing Manipulation and Hygiene: Maximal assistance;Sit to/from stand         General ADL Comments: patient was pending 2 units of blood at this  time. patient's evaluation was kept minimal for safety.     Vision Patient Visual Report: No change from baseline       Perception     Praxis      Pertinent Vitals/Pain Pain Assessment Pain Assessment: 0-10 Pain Score: 2  Pain Location: L hip Pain Descriptors / Indicators: Aching, Sore Pain Intervention(s): Limited activity within patient's tolerance, Monitored during session, Premedicated before session, Ice applied     Hand Dominance Right   Extremity/Trunk Assessment Upper Extremity Assessment Upper Extremity Assessment: Overall WFL for tasks assessed (noted to have some ulnar deviation on R hand. still WLF for tasks)   Lower Extremity Assessment Lower Extremity Assessment: Defer to PT evaluation   Cervical / Trunk Assessment Cervical / Trunk Assessment: Normal   Communication Communication Communication: No difficulties   Cognition Arousal/Alertness: Awake/alert Behavior During Therapy: WFL for tasks assessed/performed Overall Cognitive Status: Within Functional Limits for tasks assessed                                       General Comments       Exercises     Shoulder Instructions      Home Living Family/patient expects to be discharged to:: Private residence Living Arrangements: Spouse/significant other Available Help at Discharge: Available 24 hours/day;Family Type of Home: House Home Access: Stairs to enter [employer] of Steps: 3 Entrance Stairs-Rails: Right Home Layout: One level     Bathroom Shower/Tub: Occupational psychologist: Standard     Home Equipment: Grab bars - tub/shower;Cane - single Barista (2 wheels);Other (comment)   Additional Comments: Home O2 used mostly at night but used sometimes during the day with exertion.      Prior Functioning/Environment Prior Level of Function : Independent/Modified Independent                        OT Problem List: Decreased range  of motion;Impaired balance (sitting and/or standing);Decreased safety awareness;Decreased knowledge of precautions;Decreased knowledge of use of DME or AE;Pain;Decreased activity tolerance      OT Treatment/Interventions: Self-care/ADL training;Energy conservation;DME and/or AE instruction;Therapeutic exercise;Patient/family education;Therapeutic activities;Balance training    OT Goals(Current goals can be found in the care plan section) Acute Rehab OT Goals Patient Stated Goal: to get better and go home OT Goal Formulation: With patient Time For Goal Achievement: 10/26/22 Potential to Achieve Goals: Fair  OT Frequency: Min 2X/week    Co-evaluation PT/OT/SLP Co-Evaluation/Treatment: Yes Reason for Co-Treatment: To address functional/ADL transfers PT goals addressed during session: Mobility/safety with mobility OT goals addressed during session: ADL's and self-care      AM-PAC OT "6 Clicks" Daily Activity     Outcome Measure Help from another person eating meals?: None Help from another person taking care of personal grooming?: A Little Help from another person toileting, which includes using toliet, bedpan, or urinal?: A Lot Help from another person bathing (including washing, rinsing, drying)?: A Lot Help from another person to put on and taking off regular upper body clothing?: A Little Help from another person to put on and taking off regular lower body  clothing?: A Lot 6 Click Score: 16   End of Session Equipment Utilized During Treatment: Gait belt;Rolling walker (2 wheels) Nurse Communication: Mobility status;Other (comment) (ok to participate in session)  Activity Tolerance: Patient tolerated treatment well Patient left: in chair;with call bell/phone within reach;with chair alarm set  OT Visit Diagnosis: Unsteadiness on feet (R26.81);Muscle weakness (generalized) (M62.81);History of falling (Z91.81);Other abnormalities of gait and mobility (R26.89);Pain Pain - Right/Left:  Right Pain - part of body: Leg                Time: 0623-7628 OT Time Calculation (min): 20 min Charges:  OT General Charges $OT Visit: 1 Visit OT Evaluation $OT Eval Moderate Complexity: 1 Mod  Raheen Capili OTR/L, MS Acute Rehabilitation Department Office# [phone]   [name] 10/12/2022, 1:01 PM

## 2022-10-12 NOTE — Progress Notes (Addendum)
PROGRESS NOTE    Paula Ward  SHF:[id number] DOB: [birth year] DOA: 10/10/2022 PCP: Hoyt Koch, MD      Brief Narrative:   From admission h and p  Paula Ward is a 86 y.o. female with medical history significant of AMV, CKD, CREST syndrome, GERD, ILD, hx of PE s/p IVC, pulmonary HTN, chronic respiratory failure with 2L oxygen at night, hx of RCC who presented to ED after she fell and ahd left hip pain.  She was at [facility] and was pulling an item from a shelf and someone bumped into her, lost her balance and fell onto her left hip.  She could not get up. Denies any trauma to her head.  She is on no blood thinner or ASA.    She has been feeling good. Denies any fever/chills, vision changes/headaches, chest pain or palpitations, shortness of breath or cough, abdominal pain, N/V/D, dysuria or leg swelling.   Assessment & Plan:   Principal Problem:   Closed comminuted intertrochanteric fracture of left femur (HCC) Active Problems:   Essential hypertension   Pulmonary hypertension (HCC)   Chronic diastolic heart failure (HCC)   ILD (interstitial lung disease) (HCC)   Chronic respiratory failure with hypoxia (HCC)   Pericardial effusion   Moderate aortic stenosis   CKD (chronic kidney disease) stage 3, GFR 30-59 ml/min (HCC)   Anemia, chronic disease   PULMONARY EMBOLISM, HX OF   GASTROESOPHAGEAL REFLUX DISEASE   Adjustment disorder   SCLERODERMA   Closed comminuted intertrochanteric fracture of left femur (HCC) S/p intramedullary nailing with ortho 12/23. Ortho replacing 2 units pRBC's today for post-op anemia. PT evaluation - SNF recommended.  TOC following - pain control, bowel regimen - trend hgb, transfuse for less than 8 - pharmacologic dvt ppx per ortho (Eliquis 2.5 mg PO BID x 3 weeks) - advance diet   Essential hypertension Here bp wnl - cont home dilt - hold home spiro/lasix given mild increase in cr   Pulmonary hypertension ([place]) -felt  secondary to history of crest syndrome and interstitial lung disease as well as history of pulmonary embolus.   -She is followed by pulmonary and at [place]  -Continue present medications    ILD (interstitial lung disease) ([place]) Followed by pulm and at [place] therapy has been discussed previously at her visits at [facility] and not initiated due to the side effect profile  Continue 2L [place] oxygen at night    Chronic respiratory failure with hypoxia (HCC) On 2L oxygen at night only secondary to ILD  stable   Moderate aortic stenosis Moderate by echo in May 02/2022  LHC/RHC in 04/2022 with moderate AS not candidate for TAVR at that time    Pericardial effusion Large pericardial effusion by echo in 02/2022 that surrounds heart. There is no evidence by echo criteria for hemodynamic compromise. Mitral inflow shows no signficant respiratory variation, there is no RV collapse, IVC is normal size    CKD (chronic kidney disease) stage 3, GFR 30-59 ml/min (HCC) Mild cr increase, will continue gentle fluids, continue to monitor    Anemia, chronic disease Acute blood loss anemia - POD 1 Hbg 7.4 from 8.9 2 units pRBC's ordered for transfusion per ortho IV iron infusions q 6 weeks  Iron studies done 09/2022  Transfusion thresholds as above   PULMONARY EMBOLISM, HX OF History of IVC placed in 2012, will plan on lovenox ppx when ortho gives go ahead   [place] protonix  Adjustment disorder - cont home citalopram  Macrocytosis - f/u b12/folate   DVT prophylaxis: SCDs, plan to start lovenox likely tomorrow Code Status: DNR Family Communication: husband and son updated @ bedside  Level of care: Med-Surg Status is: Inpatient     Consultants:  Orthopedic surgery  Procedures: Intramedullary nailing left intertrochanter femur fracture 12/23  Antimicrobials:  Surgical ppx    Subjective: Post op day 1 - pain well controlled so far.  She was  surprised able to begin ambulating this morning already.  No acute complaints.   Objective: Vitals:   10/11/22 1708 10/11/22 2200 10/12/22 0100 10/12/22 0942  BP: (!) 104/42 (!) 102/46 (!) 118/54 (!) 109/46  Pulse: 88 91 99 86  Resp: _0 Temp: 98.3 F (36.8 C) 98.4 F (36.9 C) 98.4 F (36.9 C) 98.3 F (36.8 C)  TempSrc: Oral Oral Oral Oral  SpO2: 98% 97% 98% 100%  Weight:      Height:        Intake/Output Summary (Last 24 hours) at 10/12/2022 1247 Last data filed at 10/12/2022 1046 Gross per 24 hour  Intake 2210 ml  Output 1025 ml  Net 1185 ml   Filed Weights   10/11/22 0157  Weight: 49.2 kg    Examination:  General exam: awake, alert, no acute distress HEENT: atraumatic, clear conjunctiva, anicteric sclera, moist mucus membranes, hearing grossly normal  Respiratory system: CTAB, no wheezes, rales or rhonchi, normal respiratory effort. Cardiovascular system: normal B8/G6, RRR, 3/6 systolic murmur, no pedal edema.   Gastrointestinal system: soft, NT, ND, no HSM felt, +bowel sounds. Central nervous system: A&O x3. no gross focal neurologic deficits, normal speech Extremities: moves all, no edema, normal tone Skin: dry, intact, normal temperature Psychiatry: normal mood, congruent affect, judgement and insight appear normal     Data Reviewed: I have personally reviewed following labs and imaging studies  CBC: Recent Labs  Lab 10/10/22 1828 10/11/22 0404 10/12/22 0348  WBC 7.0 8.9 9.2  NEUTROABS 5.8  --   --   HGB 10.1* 8.7* 7.4*  HCT 31.7* 27.2* 23.5*  MCV 112.4* 112.4* 115.2*  PLT 178 168 659   Basic Metabolic Panel: Recent Labs  Lab 10/10/22 1828 10/11/22 0404 10/12/22 0348  NA 137 137 137  K 3.9 4.4 4.9  CL 102 104 103  CO2 _1 GLUCOSE 148* 133* 112*  BUN 37* 37* 45*  CREATININE 1.38* 1.40* 1.52*  CALCIUM 9.0 8.6* 8.2*   GFR: Estimated Creatinine Clearance: 21.4 mL/min (A) (by C-G formula based on SCr of 1.52 mg/dL (H)). Liver  Function Tests: Recent Labs  Lab 10/10/22 1828  AST 24  ALT 14  ALKPHOS 54  BILITOT 0.8  PROT 6.6  ALBUMIN 4.0   No results for input(s): "LIPASE", "AMYLASE" in the last 168 hours. No results for input(s): "AMMONIA" in the last 168 hours. Coagulation Profile: Recent Labs  Lab 10/10/22 1828 10/10/22 2346  INR 1.1 1.1   Cardiac Enzymes: No results for input(s): "CKTOTAL", "CKMB", "CKMBINDEX", "TROPONINI" in the last 168 hours. BNP (last 3 results) No results for input(s): "PROBNP" in the last 8760 hours. HbA1C: No results for input(s): "HGBA1C" in the last 72 hours. CBG: No results for input(s): "GLUCAP" in the last 168 hours. Lipid Profile: No results for input(s): "CHOL", "HDL", "LDLCALC", "TRIG", "CHOLHDL", "LDLDIRECT" in the last 72 hours. Thyroid Function Tests: No results for input(s): "TSH", "T4TOTAL", "FREET4", "T3FREE", "THYROIDAB" in the last 72 hours. Anemia Panel: Recent Labs  10/11/22 1530  VITAMINB12 254  FOLATE 21.5   Urine analysis:    Component Value Date/Time   COLORURINE YELLOW 03/30/2021 1416   APPEARANCEUR CLEAR 03/30/2021 1416   LABSPEC 1.013 03/30/2021 1416   PHURINE 5.0 03/30/2021 1416   GLUCOSEU NEGATIVE 03/30/2021 1416   GLUCOSEU NEGATIVE 01/10/2021 0958   HGBUR NEGATIVE 03/30/2021 1416   BILIRUBINUR negative 07/16/2022 1658   BILIRUBINUR Neg 09/03/2017 1529   KETONESUR negative 07/16/2022 1658   KETONESUR NEGATIVE 03/30/2021 1416   PROTEINUR negative 07/16/2022 1658   PROTEINUR NEGATIVE 03/30/2021 1416   UROBILINOGEN 0.2 07/16/2022 1658   UROBILINOGEN 0.2 01/10/2021 0958   NITRITE Negative 07/16/2022 1658   NITRITE NEGATIVE 03/30/2021 1416   LEUKOCYTESUR Negative 07/16/2022 1658   LEUKOCYTESUR NEGATIVE 03/30/2021 1416   Sepsis Labs: _0 (procalcitonin:4,lacticidven:4)  )No results found for this or any previous visit (from the past 240 hour(s)).       Radiology Studies: DG HIP UNILAT WITH PELVIS 1V LEFT  Result  Date: 10/11/2022 CLINICAL DATA:  Fluoroscopic assistance for internal fixation EXAM: DG HIP (WITH OR WITHOUT PELVIS) 1V*L* COMPARISON:  10/10/2022 FINDINGS: Fluoroscopic images show internal fixation of comminuted intertrochanteric fracture of proximal left femur with intramedullary rod. Fluoroscopy time 24 seconds. Radiation dose 5.29 mGy. IMPRESSION: Fluoroscopic assistance was provided for internal fixation of fracture of proximal left femur. Electronically Signed   By: Elmer Picker M.D.   On: 10/11/2022 13:03   DG C-Arm 1-60 Min-No Report  Result Date: 10/11/2022 Fluoroscopy was utilized by the requesting physician.  No radiographic interpretation.   DG Chest Port 1 View  Result Date: 10/10/2022 CLINICAL DATA:  Fall, chest injury EXAM: PORTABLE CHEST 1 VIEW COMPARISON:  07/28/2022 FINDINGS: Opacification of the upper lung zones bilaterally likely relates to overlying breast tissue. No pneumothorax or pleural effusion. Moderate cardiomegaly is stable. No acute bone abnormality. Surgical clips are seen within the left breast and axilla. IMPRESSION: 1. No active disease. 2. Stable cardiomegaly. Electronically Signed   By: Fidela Salisbury M.D.   On: 10/10/2022 19:17   DG Pelvis Portable  Result Date: 10/10/2022 CLINICAL DATA:  Fall at [place] 1-2 VIEWS; LEFT FEMUR PORTABLE 2 VIEWS COMPARISON:  CT abdomen and pelvis 04/04/2019 FINDINGS: Acute comminuted intertrochanteric fracture of the left proximal femur. There is superior displacement of the fracture. Mildly displaced greater and lesser trochanters. The femoral head remains located within the acetabulum though appears rotated superiorly. No additional fractures. IMPRESSION: Comminuted left intertrochanteric fracture. Electronically Signed   By: Placido Sou M.D.   On: 10/10/2022 19:06   DG Femur Portable Min 2 Views Left  Result Date: 10/10/2022 CLINICAL DATA:  Fall at [place]: PORTABLE PELVIS 1-2 VIEWS;  LEFT FEMUR PORTABLE 2 VIEWS COMPARISON:  CT abdomen and pelvis 04/04/2019 FINDINGS: Acute comminuted intertrochanteric fracture of the left proximal femur. There is superior displacement of the fracture. Mildly displaced greater and lesser trochanters. The femoral head remains located within the acetabulum though appears rotated superiorly. No additional fractures. IMPRESSION: Comminuted left intertrochanteric fracture. Electronically Signed   By: Placido Sou M.D.   On: 10/10/2022 19:06        Scheduled Meds:  sodium chloride   Intravenous Once   acetaminophen  500 mg Oral Q6H   apixaban  2.5 mg Oral BID   citalopram  5 mg Oral Daily   diltiazem  120 mg Oral q morning   docusate sodium  100 mg Oral BID   fluticasone  1 spray Each Nare Daily  guaiFENesin  600 mg Oral Daily   ipratropium  2 spray Each Nare Q12H   latanoprost  1 drop Both Eyes QHS   macitentan  10 mg Oral Daily   montelukast  10 mg Oral QHS   multivitamin with minerals  1 tablet Oral Daily   pantoprazole  40 mg Oral BID AC   polyethylene glycol  17 g Oral Daily   senna  1 tablet Oral Daily   Continuous Infusions:  sodium chloride 75 mL/hr at 10/11/22 1909   methocarbamol (ROBAXIN) IV       LOS: 2 days     Ezekiel Slocumb, DO [facility]   If 7PM-7AM, please contact night-coverage [email or web address] Password [facility] 10/12/2022, 12:47 PM

## 2022-10-13 DIAGNOSIS — I350 Nonrheumatic aortic (valve) stenosis: Secondary | ICD-10-CM

## 2022-10-13 DIAGNOSIS — D638 Anemia in other chronic diseases classified elsewhere: Secondary | ICD-10-CM | POA: Diagnosis not present

## 2022-10-13 DIAGNOSIS — I5032 Chronic diastolic (congestive) heart failure: Secondary | ICD-10-CM | POA: Diagnosis not present

## 2022-10-13 DIAGNOSIS — J9611 Chronic respiratory failure with hypoxia: Secondary | ICD-10-CM | POA: Diagnosis not present

## 2022-10-13 LAB — TYPE AND SCREEN
ABO/RH(D): A POS
Antibody Screen: NEGATIVE
Unit division: 0
Unit division: 0

## 2022-10-13 LAB — BASIC METABOLIC PANEL
Anion gap: 6 (ref 5–15)
BUN: 44 mg/dL — ABNORMAL HIGH (ref 8–23)
CO2: 23 mmol/L (ref 22–32)
Calcium: 8.4 mg/dL — ABNORMAL LOW (ref 8.9–10.3)
Chloride: 107 mmol/L (ref 98–111)
Creatinine, Ser: 1.24 mg/dL — ABNORMAL HIGH (ref 0.44–1.00)
GFR, Estimated: 43 mL/min — ABNORMAL LOW (ref >60)
Glucose, Bld: 98 mg/dL (ref 70–99)
Potassium: 4.2 mmol/L (ref 3.5–5.1)
Sodium: 136 mmol/L (ref 135–145)

## 2022-10-13 LAB — CBC
HCT: 35.3 % — ABNORMAL LOW (ref 36.0–46.0)
Hemoglobin: 11.5 g/dL — ABNORMAL LOW (ref 12.0–15.0)
MCH: 31.3 pg (ref 26.0–34.0)
MCHC: 32.6 g/dL (ref 30.0–36.0)
MCV: 95.9 fL (ref 80.0–100.0)
Platelets: 174 10*3/uL (ref 150–400)
RBC: 3.68 MIL/uL — ABNORMAL LOW (ref 3.87–5.11)
WBC: 8.1 10*3/uL (ref 4.0–10.5)
nRBC: 0 % (ref 0.0–0.2)

## 2022-10-13 LAB — BPAM RBC
Blood Product Expiration Date: 202401162359
Blood Product Expiration Date: 202401162359
ISSUE DATE / TIME: 202312241321
ISSUE DATE / TIME: 202312241741
Unit Type and Rh: 6200
Unit Type and Rh: 6200

## 2022-10-13 LAB — HEMOGLOBIN AND HEMATOCRIT, BLOOD
HCT: 35.7 % — ABNORMAL LOW (ref 36.0–46.0)
Hemoglobin: 11.7 g/dL — ABNORMAL LOW (ref 12.0–15.0)

## 2022-10-13 NOTE — Progress Notes (Signed)
Physical Therapy Treatment Patient Details Name: Paula Ward MRN: [id number] DOB: Dec 14, 1935 Today's Date: 10/13/2022   History of Present Illness Patient is a 86 year old female who s/p fall with L hip fx and now s/p IM nail.  pt with hx of Breast CA, AVM, CKD, PE s/p IVC, pulmonary HTN, Sceroderma, interstitial lung disease and chronic respiratory failure on 2L O2 at night    PT Comments    Pt continues cooperative but requiring increased time for all tasks and fatigues easily.   Recommendations for follow up therapy are one component of a multi-disciplinary discharge planning process, led by the attending physician.  Recommendations may be updated based on patient status, additional functional criteria and insurance authorization.  Follow Up Recommendations  Skilled nursing-short term rehab (<3 hours/day) Can patient physically be transported by private vehicle: Yes   Assistance Recommended at Discharge Frequent or constant Supervision/Assistance  Patient can return home with the following A little help with walking and/or transfers;A little help with bathing/dressing/bathroom;Assistance with cooking/housework;Assist for transportation;Help with stairs or ramp for entrance   Equipment Recommendations  Rolling walker (2 wheels)    Recommendations for Other Services       Precautions / Restrictions Precautions Precautions: Fall Restrictions LLE Weight Bearing: Weight bearing as tolerated     Mobility  Bed Mobility               General bed mobility comments: Pt up in recliner and finished session on BSC    Transfers Overall transfer level: Needs assistance Equipment used: Rolling walker (2 wheels) Transfers: Sit to/from Stand Sit to Stand: Min assist, Mod assist           General transfer comment: cues for LE management and use of UEs to self assist.  Physical assist to bring wt up and fwd and to balance in initial standing with RW     Ambulation/Gait Ambulation/Gait assistance: Min assist Gait Distance (Feet): 4 Feet Assistive device: Rolling walker (2 wheels) Gait Pattern/deviations: Step-to pattern, Decreased step length - right, Decreased step length - left, Shuffle, Trunk flexed Gait velocity: decr     General Gait Details: cues for sequence, posture and position from RW; distance ltd by fatigue   Stairs             Wheelchair Mobility    Modified Rankin (Stroke Patients Only)       Balance Overall balance assessment: Mild deficits observed, not formally tested                                          Cognition Arousal/Alertness: Awake/alert Behavior During Therapy: WFL for tasks assessed/performed Overall Cognitive Status: Within Functional Limits for tasks assessed                                          Exercises General Exercises - Lower Extremity Ankle Circles/Pumps: AROM, Both, 15 reps, Supine Quad Sets: AROM, Both, 10 reps, Supine Heel Slides: AAROM, Left, 15 reps, Supine Hip ABduction/ADduction: AAROM, Left, 10 reps    General Comments        Pertinent Vitals/Pain Pain Assessment Pain Assessment: 0-10 Pain Score: 5  Pain Location: L hip with activity Pain Descriptors / Indicators: Aching, Sore Pain Intervention(s): Limited activity within patient's tolerance, Monitored during  session, Premedicated before session (Pt taking Tyelenol only)    Home Living                          Prior Function            PT Goals (current goals can now be found in the care plan section) Acute Rehab PT Goals Patient Stated Goal: regain IND PT Goal Formulation: With patient Time For Goal Achievement: 10/26/22 Potential to Achieve Goals: Good Progress towards PT goals: Progressing toward goals    Frequency    Min 5X/week      PT Plan Current plan remains appropriate    Co-evaluation              AM-PAC PT "6 Clicks"  Mobility   Outcome Measure  Help needed turning from your back to your side while in a flat bed without using bedrails?: A Little Help needed moving from lying on your back to sitting on the side of a flat bed without using bedrails?: A Little Help needed moving to and from a bed to a chair (including a wheelchair)?: A Little Help needed standing up from a chair using your arms (e.g., wheelchair or bedside chair)?: A Little Help needed to walk in hospital room?: Total Help needed climbing 3-5 steps with a railing? : Total 6 Click Score: 14    End of Session Equipment Utilized During Treatment: Gait belt Activity Tolerance: Patient limited by fatigue Patient left: Other (comment) [facility]) Nurse Communication: Mobility status PT Visit Diagnosis: Muscle weakness (generalized) (M62.81);Difficulty in walking, not elsewhere classified (R26.2)     Time: 2419-9144 PT Time Calculation (min) (ACUTE ONLY): 23 min  Charges:  $Gait Training: 8-22 mins $Therapeutic Exercise: 8-22 mins                     [place] Pager [phone] Office [phone]    [name] 10/13/2022, 10:30 AM

## 2022-10-13 NOTE — Progress Notes (Addendum)
PROGRESS NOTE    Paula Ward  MWN:[id number] DOB: 1936-08-08 DOA: 10/10/2022 PCP: Hoyt Koch, MD      Brief Narrative:   From admission h and p  Paula Ward is a 86 y.o. female with medical history significant of AMV, CKD, CREST syndrome, GERD, ILD, hx of PE s/p IVC, pulmonary HTN, chronic respiratory failure with 2L oxygen at night, hx of RCC who presented to ED after she fell and ahd left hip pain.  She was at [facility] and was pulling an item from a shelf and someone bumped into her, lost her balance and fell onto her left hip.  She could not get up. Denies any trauma to her head.  She is on no blood thinner or ASA.    She has been feeling good. Denies any fever/chills, vision changes/headaches, chest pain or palpitations, shortness of breath or cough, abdominal pain, N/V/D, dysuria or leg swelling.   Assessment & Plan:   Principal Problem:   Closed comminuted intertrochanteric fracture of left femur (HCC) Active Problems:   Essential hypertension   Pulmonary hypertension (HCC)   Chronic diastolic heart failure (HCC)   ILD (interstitial lung disease) (HCC)   Chronic respiratory failure with hypoxia (HCC)   Pericardial effusion   Moderate aortic stenosis   CKD (chronic kidney disease) stage 3, GFR 30-59 ml/min (HCC)   Anemia, chronic disease   PULMONARY EMBOLISM, HX OF   GASTROESOPHAGEAL REFLUX DISEASE   Adjustment disorder   SCLERODERMA   Closed comminuted intertrochanteric fracture of left femur (HCC) S/p intramedullary nailing with ortho 12/23. Ortho replacing 2 units pRBC's today for post-op anemia. PT evaluation - SNF recommended.  TOC following - pain control, bowel regimen - trend hgb, transfuse for less than 8 - pharmacologic dvt ppx per ortho (Eliquis 2.5 mg PO BID x 3 weeks)  Anemia, chronic disease Acute blood loss anemia - POD 1 Hbg 7.4 from 8.9 2 units pRBC's ordered for transfusion per ortho IV iron infusions q 6 weeks  Iron studies  done 09/2022  Transfusion thresholds as above  12/25 -- no CBC result yet posted for today, orders in, lab notified to come draw   Essential hypertension Here bp wnl - cont home dilt - hold home spiro/lasix given mild increase in cr   Pulmonary hypertension ([place]) -felt secondary to history of crest syndrome and interstitial lung disease as well as history of pulmonary embolus.   -She is followed by pulmonary and at [place]  -Continue present medications    ILD (interstitial lung disease) ([place]) Followed by pulm and at [place] therapy has been discussed previously at her visits at [facility] and not initiated due to the side effect profile  Continue 2L [place] oxygen at night    Chronic respiratory failure with hypoxia (HCC) On 2L oxygen at night only secondary to ILD  stable   Moderate aortic stenosis Moderate by echo in May 02/2022  LHC/RHC in 04/2022 with moderate AS not candidate for TAVR at that time    Pericardial effusion Large pericardial effusion by echo in 02/2022 that surrounds heart. There is no evidence by echo criteria for hemodynamic compromise. Mitral inflow shows no signficant respiratory variation, there is no RV collapse, IVC is normal size    CKD (chronic kidney disease) stage 3, GFR 30-59 ml/min (HCC) Mild cr increase, will continue gentle fluids, continue to monitor    PULMONARY EMBOLISM, HX OF History of IVC placed in 2012, will plan on lovenox ppx when ortho  gives go ahead   [place] protonix    Adjustment disorder - cont home citalopram  Macrocytosis - f/u b12/folate   DVT prophylaxis: Eliquis 2.5 mg BID per ortho Code Status: DNR Family Communication: none @ bedside  Level of care: Med-Surg Status is: Inpatient     Consultants:  Orthopedic surgery  Procedures: Intramedullary nailing left intertrochanter femur fracture 12/23  Antimicrobials:  Surgical ppx    Subjective: Post op day 2 - Pt seated on BSC  this AM.  Reports only having much pain when standing up or attempting to walk.  Not much pain at rest.  Agreeable to rehab, stating she needs it.  Denies any acute complaints.  Objective: Vitals:   10/12/22 1952 10/12/22 2130 10/12/22 2140 10/13/22 0629  BP: (!) 143/46 (!) 130/47 (!) 130/47 (!) 126/58  Pulse: 83 72 83 92  Resp: _0 Temp: (!) 97.5 F (36.4 C) 98 F (36.7 C) 97.8 F (36.6 C) 98.3 F (36.8 C)  TempSrc:  Oral Oral   SpO2: 97%  97% 97%  Weight:      Height:        Intake/Output Summary (Last 24 hours) at 10/13/2022 1317 Last data filed at 10/13/2022 0745 Gross per 24 hour  Intake 2106.15 ml  Output 1200 ml  Net 906.15 ml   Filed Weights   10/11/22 0157  Weight: 49.2 kg    Examination:  General exam: awake, alert, no acute distress, frail HEENT: moist mucus membranes, hearing grossly normal  Respiratory system: on room air, normal respiratory effort. Cardiovascular system: RRR, 3/6 systolic murmur, no pedal edema.   Gastrointestinal system: soft, NT, ND Central nervous system: A&O x3. no gross focal neurologic deficits, normal speech Extremities: moves all, no edema, normal tone Skin: dry, intact, normal temperature Psychiatry: normal mood, congruent affect, judgement and insight appear normal     Data Reviewed: I have personally reviewed following labs and imaging studies  CBC: Recent Labs  Lab 10/10/22 1828 10/11/22 0404 10/12/22 0348  WBC 7.0 8.9 9.2  NEUTROABS 5.8  --   --   HGB 10.1* 8.7* 7.4*  HCT 31.7* 27.2* 23.5*  MCV 112.4* 112.4* 115.2*  PLT 178 168 656   Basic Metabolic Panel: Recent Labs  Lab 10/10/22 1828 10/11/22 0404 10/12/22 0348 10/13/22 0402  NA 137 137 137 136  K 3.9 4.4 4.9 4.2  CL 102 104 103 107  CO2 _1 GLUCOSE 148* 133* 112* 98  BUN 37* 37* 45* 44*  CREATININE 1.38* 1.40* 1.52* 1.24*  CALCIUM 9.0 8.6* 8.2* 8.4*   GFR: Estimated Creatinine Clearance: 26.2 mL/min (A) (by C-G formula based  on SCr of 1.24 mg/dL (H)). Liver Function Tests: Recent Labs  Lab 10/10/22 1828  AST 24  ALT 14  ALKPHOS 54  BILITOT 0.8  PROT 6.6  ALBUMIN 4.0   No results for input(s): "LIPASE", "AMYLASE" in the last 168 hours. No results for input(s): "AMMONIA" in the last 168 hours. Coagulation Profile: Recent Labs  Lab 10/10/22 1828 10/10/22 2346  INR 1.1 1.1   Cardiac Enzymes: No results for input(s): "CKTOTAL", "CKMB", "CKMBINDEX", "TROPONINI" in the last 168 hours. BNP (last 3 results) No results for input(s): "PROBNP" in the last 8760 hours. HbA1C: No results for input(s): "HGBA1C" in the last 72 hours. CBG: No results for input(s): "GLUCAP" in the last 168 hours. Lipid Profile: No results for input(s): "CHOL", "HDL", "LDLCALC", "TRIG", "CHOLHDL", "LDLDIRECT" in the last  72 hours. Thyroid Function Tests: No results for input(s): "TSH", "T4TOTAL", "FREET4", "T3FREE", "THYROIDAB" in the last 72 hours. Anemia Panel: Recent Labs    10/11/22 1530  VITAMINB12 254  FOLATE 21.5   Urine analysis:    Component Value Date/Time   COLORURINE YELLOW 03/30/2021 1416   APPEARANCEUR CLEAR 03/30/2021 1416   LABSPEC 1.013 03/30/2021 1416   PHURINE 5.0 03/30/2021 1416   GLUCOSEU NEGATIVE 03/30/2021 1416   GLUCOSEU NEGATIVE 01/10/2021 0958   HGBUR NEGATIVE 03/30/2021 1416   BILIRUBINUR negative 07/16/2022 1658   BILIRUBINUR Neg 09/03/2017 1529   KETONESUR negative 07/16/2022 [place] 03/30/2021 1416   PROTEINUR negative 07/16/2022 1658   PROTEINUR NEGATIVE 03/30/2021 1416   UROBILINOGEN 0.2 07/16/2022 1658   UROBILINOGEN 0.2 01/10/2021 0958   NITRITE Negative 07/16/2022 1658   NITRITE NEGATIVE 03/30/2021 1416   LEUKOCYTESUR Negative 07/16/2022 1658   LEUKOCYTESUR NEGATIVE 03/30/2021 1416   Sepsis Labs: _0 (procalcitonin:4,lacticidven:4)  )No results found for this or any previous visit (from the past 240 hour(s)).       Radiology Studies: No  results found.      Scheduled Meds:  apixaban  2.5 mg Oral BID   citalopram  5 mg Oral Daily   diclofenac Sodium  4 g Topical QID   diltiazem  120 mg Oral q morning   docusate sodium  100 mg Oral BID   fluticasone  1 spray Each Nare Daily   guaiFENesin  600 mg Oral Daily   ipratropium  2 spray Each Nare Q12H   latanoprost  1 drop Both Eyes QHS   macitentan  10 mg Oral Daily   montelukast  10 mg Oral QHS   multivitamin with minerals  1 tablet Oral Daily   pantoprazole  40 mg Oral BID AC   polyethylene glycol  17 g Oral Daily   senna  1 tablet Oral Daily   Continuous Infusions:  sodium chloride Stopped (10/13/22 0738)   methocarbamol (ROBAXIN) IV       LOS: 3 days     Ezekiel Slocumb, DO [facility]   If 7PM-7AM, please contact night-coverage [email or web address] Password [email or web address] 10/13/2022, 1:17 PM

## 2022-10-13 NOTE — Progress Notes (Signed)
Physical Therapy Treatment Patient Details Name: Paula Ward MRN: [id number] DOB: 31-Aug-1936 Today's Date: 10/13/2022   History of Present Illness Patient is a 86 year old female who s/p fall with L hip fx and now s/p IM nail.  pt with hx of Breast CA, AVM, CKD, PE s/p IVC, pulmonary HTN, Sceroderma, interstitial lung disease and chronic respiratory failure on 2L O2 at night    PT Comments    Pt continues cooperative but limited by fatigue.  Pt performed therex with assist and requesting to attempt side sleeping for comfort - pt min/mod assist to perform log roll to side ly with knees bent and pillow between..  Pt would benefit from follow up rehab at SNF level to maximize IND and safety prior to return home with very limited assist.   Recommendations for follow up therapy are one component of a multi-disciplinary discharge planning process, led by the attending physician.  Recommendations may be updated based on patient status, additional functional criteria and insurance authorization.  Follow Up Recommendations  Skilled nursing-short term rehab (<3 hours/day) Can patient physically be transported by private vehicle: Yes   Assistance Recommended at Discharge Frequent or constant Supervision/Assistance  Patient can return home with the following A little help with walking and/or transfers;A little help with bathing/dressing/bathroom;Assistance with cooking/housework;Assist for transportation;Help with stairs or ramp for entrance   Equipment Recommendations  Rolling walker (2 wheels)    Recommendations for Other Services       Precautions / Restrictions Precautions Precautions: Fall Restrictions Weight Bearing Restrictions: No LLE Weight Bearing: Weight bearing as tolerated     Mobility  Bed Mobility Overal bed mobility: Needs Assistance Bed Mobility: Rolling Rolling: Min assist, Mod assist         General bed mobility comments: OOB deferred 2* pt fatigue but pt  wanting to sleep on side.  Pt performed log roll technique with pillow between bent knees.  Pt demonstrates fair ability to perform Lateral shifts using L LE and to move up in bed with mod assist    Transfers Overall transfer level: Needs assistance Equipment used: Rolling walker (2 wheels) Transfers: Sit to/from Stand Sit to Stand: Min assist, Mod assist           General transfer comment: cues for LE management and use of UEs to self assist.  Physical assist to bring wt up and fwd and to balance in initial standing with RW    Ambulation/Gait Ambulation/Gait assistance: Min assist Gait Distance (Feet): 4 Feet Assistive device: Rolling walker (2 wheels) Gait Pattern/deviations: Step-to pattern, Decreased step length - right, Decreased step length - left, Shuffle, Trunk flexed Gait velocity: decr     General Gait Details: cues for sequence, posture and position from RW; distance ltd by fatigue   Stairs             Wheelchair Mobility    Modified Rankin (Stroke Patients Only)       Balance Overall balance assessment: Mild deficits observed, not formally tested                                          Cognition Arousal/Alertness: Awake/alert Behavior During Therapy: WFL for tasks assessed/performed Overall Cognitive Status: Within Functional Limits for tasks assessed  Exercises General Exercises - Lower Extremity Ankle Circles/Pumps: AROM, Both, 15 reps, Supine Quad Sets: AROM, Both, 10 reps, Supine Heel Slides: AAROM, Left, 15 reps, Supine Hip ABduction/ADduction: AAROM, Left, 10 reps    General Comments        Pertinent Vitals/Pain Pain Assessment Pain Assessment: 0-10 Pain Score: 5  Pain Location: L hip with activity Pain Descriptors / Indicators: Aching, Sore Pain Intervention(s): Limited activity within patient's tolerance, Premedicated before session, Monitored during  session, Ice applied    Home Living                          Prior Function            PT Goals (current goals can now be found in the care plan section) Acute Rehab PT Goals Patient Stated Goal: regain IND PT Goal Formulation: With patient Time For Goal Achievement: 10/26/22 Potential to Achieve Goals: Good Progress towards PT goals: Progressing toward goals    Frequency    Min 5X/week      PT Plan Current plan remains appropriate    Co-evaluation              AM-PAC PT "6 Clicks" Mobility   Outcome Measure  Help needed turning from your back to your side while in a flat bed without using bedrails?: A Little Help needed moving from lying on your back to sitting on the side of a flat bed without using bedrails?: A Little Help needed moving to and from a bed to a chair (including a wheelchair)?: A Little Help needed standing up from a chair using your arms (e.g., wheelchair or bedside chair)?: A Little Help needed to walk in hospital room?: Total Help needed climbing 3-5 steps with a railing? : Total 6 Click Score: 14    End of Session   Activity Tolerance: Patient limited by fatigue Patient left: in bed;with call bell/phone within reach;with bed alarm set Nurse Communication: Mobility status PT Visit Diagnosis: Muscle weakness (generalized) (M62.81);Difficulty in walking, not elsewhere classified (R26.2)     Time: 7371-0626 PT Time Calculation (min) (ACUTE ONLY): 27 min  Charges:  $Therapeutic Exercise: 8-22 mins $Therapeutic Activity: 8-22 mins                     Debe Coder PT Acute Rehabilitation Services Pager [phone] Office [phone]    Paula Ward 10/13/2022, 3:10 PM

## 2022-10-13 NOTE — Progress Notes (Addendum)
Subjective: 2 Days Post-Op Procedure(s) (LRB): INTRAMEDULLARY (IM) NAIL INTERTROCHANTERIC (Left)  Patient reports pain as mild to moderate.  Notes that she's struggling a little bit with therapy.  Denies fever, chills, N/V, CP, SOB, or light headedness.  Tolerating POs well.  Admits to flatus.  2 units PRBCs transfused yesterday.  Objective:   VITALS:  Temp:  [97.5 F (36.4 C)-98.9 F (37.2 C)] 98.3 F (36.8 C) (12/25 0629) Pulse Rate:  [72-96] 92 (12/25 0629) Resp:  [16-20] 18 (12/25 0629) BP: (109-143)/(42-58) 126/58 (12/25 0629) SpO2:  [94 %-100 %] 97 % (12/25 0629)  General: WDWN patient in NAD. Psych:  Appropriate mood and affect. Neuro:  A&O x 3, Moving all extremities, sensation intact to light touch HEENT:  EOMs intact Chest:  Even non-labored respirations Skin:  Dressing C/D/I, no rashes or lesions Extremities: warm/dry, mild edema to left thigh, no erythema or echymosis.  No lymphadenopathy. Pulses: Popliteus 2+ MSK:  ROM: TKE, MMT: able to perform quad set, (-) Homan's    LABS Recent Labs    10/10/22 1828 10/11/22 0404 10/12/22 0348  HGB 10.1* 8.7* 7.4*  WBC 7.0 8.9 9.2  PLT 178 168 161   Recent Labs    10/12/22 0348 10/13/22 0402  NA 137 136  K 4.9 4.2  CL 103 107  CO2 25 23  BUN 45* 44*  CREATININE 1.52* 1.24*  GLUCOSE 112* 98   Recent Labs    10/10/22 1828 10/10/22 2346  INR 1.1 1.1     Assessment/Plan: 2 Days Post-Op Procedure(s) (LRB): INTRAMEDULLARY (IM) NAIL INTERTROCHANTERIC (Left)  Patient seen in rounds for Dr. Wynelle Link. WBAT L LE Hbg 10.1 DVT ppx: Eliquis Disp:  SNV vs HHPT Plan for 2 week outpatient post-op visit.   Mechele Claude PA-C [facility] Office:  [phone]   Addendum:  Most recent Hgb 7.4 obtained on 12/24.

## 2022-10-14 ENCOUNTER — Encounter (HOSPITAL_COMMUNITY): Payer: Self-pay | Admitting: Orthopedic Surgery

## 2022-10-14 DIAGNOSIS — I272 Pulmonary hypertension, unspecified: Secondary | ICD-10-CM

## 2022-10-14 DIAGNOSIS — J9611 Chronic respiratory failure with hypoxia: Secondary | ICD-10-CM | POA: Diagnosis not present

## 2022-10-14 DIAGNOSIS — D638 Anemia in other chronic diseases classified elsewhere: Secondary | ICD-10-CM | POA: Diagnosis not present

## 2022-10-14 DIAGNOSIS — I5032 Chronic diastolic (congestive) heart failure: Secondary | ICD-10-CM | POA: Diagnosis not present

## 2022-10-14 LAB — CBC
HCT: 35.7 % — ABNORMAL LOW (ref 36.0–46.0)
Hemoglobin: 11.4 g/dL — ABNORMAL LOW (ref 12.0–15.0)
MCH: 31.4 pg (ref 26.0–34.0)
MCHC: 31.9 g/dL (ref 30.0–36.0)
MCV: 98.3 fL (ref 80.0–100.0)
Platelets: 187 10*3/uL (ref 150–400)
RBC: 3.63 MIL/uL — ABNORMAL LOW (ref 3.87–5.11)
RDW: 23.4 % — ABNORMAL HIGH (ref 11.5–15.5)
WBC: 7 10*3/uL (ref 4.0–10.5)
nRBC: 0 % (ref 0.0–0.2)

## 2022-10-14 LAB — BASIC METABOLIC PANEL
Anion gap: 6 (ref 5–15)
BUN: 38 mg/dL — ABNORMAL HIGH (ref 8–23)
CO2: 24 mmol/L (ref 22–32)
Calcium: 8.3 mg/dL — ABNORMAL LOW (ref 8.9–10.3)
Chloride: 108 mmol/L (ref 98–111)
Creatinine, Ser: 1.06 mg/dL — ABNORMAL HIGH (ref 0.44–1.00)
GFR, Estimated: 52 mL/min — ABNORMAL LOW (ref >60)
Glucose, Bld: 102 mg/dL — ABNORMAL HIGH (ref 70–99)
Potassium: 4.1 mmol/L (ref 3.5–5.1)
Sodium: 138 mmol/L (ref 135–145)

## 2022-10-14 NOTE — Progress Notes (Signed)
PROGRESS NOTE    Paula Ward  UGQ:[id number] DOB: [birth year] DOA: 10/10/2022 PCP: Hoyt Koch, MD      Brief Narrative:   From admission h and p  Paula Ward is a 86 y.o. female with medical history significant of AMV, CKD, CREST syndrome, GERD, ILD, hx of PE s/p IVC, pulmonary HTN, chronic respiratory failure with 2L oxygen at night, hx of RCC who presented to ED after she fell and ahd left hip pain.  She was at [facility] and was pulling an item from a shelf and someone bumped into her, lost her balance and fell onto her left hip.  She could not get up. Denies any trauma to her head.  She is on no blood thinner or ASA.    She has been feeling good. Denies any fever/chills, vision changes/headaches, chest pain or palpitations, shortness of breath or cough, abdominal pain, N/V/D, dysuria or leg swelling.   Assessment & Plan:   Principal Problem:   Closed comminuted intertrochanteric fracture of left femur (HCC) Active Problems:   Essential hypertension   Pulmonary hypertension (HCC)   Chronic diastolic heart failure (HCC)   ILD (interstitial lung disease) (HCC)   Chronic respiratory failure with hypoxia (HCC)   Pericardial effusion   Moderate aortic stenosis   CKD (chronic kidney disease) stage 3, GFR 30-59 ml/min (HCC)   Anemia, chronic disease   PULMONARY EMBOLISM, HX OF   GASTROESOPHAGEAL REFLUX DISEASE   Adjustment disorder   SCLERODERMA   Closed comminuted intertrochanteric fracture of left femur (HCC) S/p intramedullary nailing with ortho 12/23. Ortho replacing 2 units pRBC's today for post-op anemia. PT evaluation - SNF recommended.  TOC following - pain control, bowel regimen - trend hgb, transfuse for less than 8 - pharmacologic dvt ppx per ortho (Eliquis 2.5 mg PO BID x 3 weeks)  Anemia, chronic disease Acute blood loss anemia - POD 1 Hbg 7.4 from 8.9 2 units pRBC's ordered for transfusion per ortho IV iron infusions q 6 weeks  Iron studies  done 09/2022  12/26: Hemoglobin improved and stable above 11  Essential hypertension Here bp wnl - cont home dilt - hold home spiro/lasix given mild increase in cr   Pulmonary hypertension ([place]) -felt secondary to history of crest syndrome and interstitial lung disease as well as history of pulmonary embolus.   -She is followed by pulmonary and at [place]  -Continue present medications    ILD (interstitial lung disease) ([place]) Followed by pulm and at [place] therapy has been discussed previously at her visits at [facility] and not initiated due to the side effect profile  Continue 2L [place] oxygen at night    Chronic respiratory failure with hypoxia (HCC) On 2L oxygen at night only secondary to ILD  stable   Moderate aortic stenosis Moderate by echo in May 02/2022  LHC/RHC in 04/2022 with moderate AS not candidate for TAVR at that time    Pericardial effusion Large pericardial effusion by echo in 02/2022 that surrounds heart. There is no evidence by echo criteria for hemodynamic compromise. Mitral inflow shows no signficant respiratory variation, there is no RV collapse, IVC is normal size    CKD (chronic kidney disease) stage 3, GFR 30-59 ml/min (HCC) Mild cr increase, will continue gentle fluids, continue to monitor    PULMONARY EMBOLISM, HX OF History of IVC placed in 2012, will plan on lovenox ppx when ortho gives go ahead   GASTROESOPHAGEAL REFLUX DISEASE Continue protonix    Adjustment disorder -  cont home citalopram  Macrocytosis - f/u b12/folate   DVT prophylaxis: Eliquis 2.5 mg BID per ortho Code Status: DNR Family Communication: none @ bedside  Level of care: Med-Surg Status is: Inpatient     Consultants:  Orthopedic surgery  Procedures: Intramedullary nailing left intertrochanter femur fracture 12/23  Antimicrobials:  Surgical ppx    Subjective: Post op day 3 -patient awake sitting up in bed when seen on rounds this morning.  She reports pain  adequately controlled with just Tylenol.  Remains agreeable to rehab.  Postop anemia improved and hemoglobin now stable in the 11's.  Objective: Vitals:   10/13/22 0629 10/13/22 1435 10/13/22 2027 10/14/22 0606  BP: (!) 126/58 (!) 125/45 (!) 117/50 (!) 136/59  Pulse: 92 91 87 95  Resp: _0 Temp: 98.3 F (36.8 C) 97.7 F (36.5 C) 98.1 F (36.7 C) 97.7 F (36.5 C)  TempSrc:  Oral Oral Oral  SpO2: 97% 95% 97% 98%  Weight:      Height:        Intake/Output Summary (Last 24 hours) at 10/14/2022 1124 Last data filed at 10/14/2022 1048 Gross per 24 hour  Intake 981.16 ml  Output --  Net 981.16 ml   Filed Weights   10/11/22 0157  Weight: 49.2 kg    Examination:  General exam: awake, alert, no acute distress, frail HEENT: moist mucus membranes, hearing grossly normal  Respiratory system: on room air, normal respiratory effort. Cardiovascular system: RRR, 3/6 systolic murmur, no pedal edema.   Gastrointestinal system: soft, NT, ND Central nervous system: A&O x3. no gross focal neurologic deficits, normal speech Extremities: moves all, no edema, normal tone Skin: dry, intact, normal temperature Psychiatry: normal mood, congruent affect, judgement and insight appear normal     Data Reviewed: I have personally reviewed following labs and imaging studies  CBC: Recent Labs  Lab 10/10/22 1828 10/11/22 0404 10/12/22 0348 10/13/22 1225 10/13/22 1339 10/14/22 0334  WBC 7.0 8.9 9.2 8.1  --  7.0  NEUTROABS 5.8  --   --   --   --   --   HGB 10.1* 8.7* 7.4* 11.5* 11.7* 11.4*  HCT 31.7* 27.2* 23.5* 35.3* 35.7* 35.7*  MCV 112.4* 112.4* 115.2* 95.9  --  98.3  PLT 178 168 161 174  --  376   Basic Metabolic Panel: Recent Labs  Lab 10/10/22 1828 10/11/22 0404 10/12/22 0348 10/13/22 0402 10/14/22 0334  NA 137 137 137 136 138  K 3.9 4.4 4.9 4.2 4.1  CL 102 104 103 107 108  CO2 _1 GLUCOSE 148* 133* 112* 98 102*  BUN 37* 37* 45* 44* 38*  CREATININE  1.38* 1.40* 1.52* 1.24* 1.06*  CALCIUM 9.0 8.6* 8.2* 8.4* 8.3*   GFR: Estimated Creatinine Clearance: 30.7 mL/min (A) (by C-G formula based on SCr of 1.06 mg/dL (H)). Liver Function Tests: Recent Labs  Lab 10/10/22 1828  AST 24  ALT 14  ALKPHOS 54  BILITOT 0.8  PROT 6.6  ALBUMIN 4.0   No results for input(s): "LIPASE", "AMYLASE" in the last 168 hours. No results for input(s): "AMMONIA" in the last 168 hours. Coagulation Profile: Recent Labs  Lab 10/10/22 1828 10/10/22 2346  INR 1.1 1.1   Cardiac Enzymes: No results for input(s): "CKTOTAL", "CKMB", "CKMBINDEX", "TROPONINI" in the last 168 hours. BNP (last 3 results) No results for input(s): "PROBNP" in the last 8760 hours. HbA1C: No results for input(s): "HGBA1C" in the last 72 hours.  CBG: No results for input(s): "GLUCAP" in the last 168 hours. Lipid Profile: No results for input(s): "CHOL", "HDL", "LDLCALC", "TRIG", "CHOLHDL", "LDLDIRECT" in the last 72 hours. Thyroid Function Tests: No results for input(s): "TSH", "T4TOTAL", "FREET4", "T3FREE", "THYROIDAB" in the last 72 hours. Anemia Panel: Recent Labs    10/11/22 1530  VITAMINB12 254  FOLATE 21.5   Urine analysis:    Component Value Date/Time   COLORURINE YELLOW 03/30/2021 1416   APPEARANCEUR CLEAR 03/30/2021 1416   LABSPEC 1.013 03/30/2021 1416   PHURINE 5.0 03/30/2021 1416   GLUCOSEU NEGATIVE 03/30/2021 1416   GLUCOSEU NEGATIVE 01/10/2021 0958   HGBUR NEGATIVE 03/30/2021 1416   BILIRUBINUR negative 07/16/2022 1658   BILIRUBINUR Neg 09/03/2017 1529   KETONESUR negative 07/16/2022 [place] 03/30/2021 1416   PROTEINUR negative 07/16/2022 1658   PROTEINUR NEGATIVE 03/30/2021 1416   UROBILINOGEN 0.2 07/16/2022 1658   UROBILINOGEN 0.2 01/10/2021 0958   NITRITE Negative 07/16/2022 1658   NITRITE NEGATIVE 03/30/2021 1416   LEUKOCYTESUR Negative 07/16/2022 1658   LEUKOCYTESUR NEGATIVE 03/30/2021 1416   Sepsis  Labs: _0 (procalcitonin:4,lacticidven:4)  )No results found for this or any previous visit (from the past 240 hour(s)).       Radiology Studies: No results found.      Scheduled Meds:  apixaban  2.5 mg Oral BID   citalopram  5 mg Oral Daily   diclofenac Sodium  4 g Topical QID   diltiazem  120 mg Oral q morning   docusate sodium  100 mg Oral BID   fluticasone  1 spray Each Nare Daily   guaiFENesin  600 mg Oral Daily   ipratropium  2 spray Each Nare Q12H   latanoprost  1 drop Both Eyes QHS   macitentan  10 mg Oral Daily   montelukast  10 mg Oral QHS   multivitamin with minerals  1 tablet Oral Daily   pantoprazole  40 mg Oral BID AC   polyethylene glycol  17 g Oral Daily   senna  1 tablet Oral Daily   Continuous Infusions:  sodium chloride Stopped (10/13/22 0738)   methocarbamol (ROBAXIN) IV       LOS: 4 days     Ezekiel Slocumb, DO [facility]   If 7PM-7AM, please contact night-coverage [email or web address] Password [facility] 10/14/2022, 11:24 AM

## 2022-10-14 NOTE — Care Management Important Message (Signed)
Important Message  Patient Details IM Letter given. Name: Paula Ward MRN: [id number] Date of Birth: 1935/12/09   Medicare Important Message Given:  Yes     Kerin Salen 10/14/2022, 3:23 PM

## 2022-10-14 NOTE — TOC Progression Note (Signed)
Transition of Care [facility]) - Progression Note    Patient Details  Name: Paula Ward MRN: [id number] Date of Birth: 06-15-1936  [facility]) CM/SW Contact  Lennart Pall, LCSW Phone Number: 10/14/2022, 3:58 PM  Clinical Narrative:    Pt has accepted SNF bed offer with [facility] who can admit pt tomorrow.  MD/RN aware.  Have received insurance authorization for this SNF admission  (Navi# [id number]).     Expected Discharge Plan: [place] Barriers to Discharge: Continued Medical Work up, Ship broker  Expected Discharge Plan and Services In-house Referral: Clinical Social Work   Post Acute Care Choice: [place] Living arrangements for the past 2 months: Single Family Home                 DME Arranged: N/A DME Agency: NA                   Social Determinants of Health (SDOH) Interventions SDOH Screenings   Food Insecurity: No Food Insecurity (10/11/2022)  Housing: Low Risk  (10/11/2022)  Transportation Needs: No Transportation Needs (10/11/2022)  Utilities: Not At Risk (10/11/2022)  Alcohol Screen: Low Risk  (04/02/2022)  Depression (PHQ2-9): Low Risk  (09/29/2022)  Financial Resource Strain: Low Risk  (04/02/2022)  Physical Activity: Inactive (04/02/2022)  Social Connections: Socially Integrated (04/02/2022)  Stress: No Stress Concern Present (04/02/2022)  Tobacco Use: Low Risk  (10/10/2022)    Readmission Risk Interventions    10/12/2022    9:05 AM 03/18/2022    3:45 PM  Readmission Risk Prevention Plan  Transportation Screening Complete Complete  PCP or Specialist Appt within 3-5 Days  Complete  HRI or [place]  Complete  Social Work Consult for [place] Planning/Counseling  Complete  Palliative Care Screening  Not Applicable  Medication Review Press photographer) Complete Complete  PCP or Specialist appointment within 3-5 days of discharge Complete   HRI or [place] Complete   SW  Recovery Care/Counseling Consult Complete   Palliative Care Screening Not Applicable   Skilled Nursing Facility Complete

## 2022-10-14 NOTE — Progress Notes (Signed)
Physical Therapy Treatment Patient Details Name: Paula Ward MRN: [id number] DOB: 16-Jan-1936 Today's Date: 10/14/2022   History of Present Illness Patient is a 86 year old female who s/p fall with L hip fx and now s/p IM nail.  pt with hx of Breast CA, AVM, CKD, PE s/p IVC, pulmonary HTN, Sceroderma, interstitial lung disease and chronic respiratory failure on 2L O2 at night    PT Comments    POD # 3 am session General Comments: AxO x 3 very pleasant Lady who fell at [facility] and will need ST Rehab at [facility].  Pt also cares for her elder Paula Ward who is 84 years old.  Family arranging care for him. Assisted OOB required increased time and effort.  General bed mobility comments: attempted to semonstarte how to use baler to self asisst L LE off bed however unable to self complete.  Required Max Assist to scoot to EOB using bed pad. General transfer comment: 75% VC's on proper hand placement transfering from bed to [facility] 1/4 pivot without use of walker then 50% vc's on proper hand placement with sit to stand onto walker after using BSC.  Unsteady.  Required asisst with peri care as pt was unable to self balance and self perform.  Pt voided and had a BM.  NT in room and assisted/recorded. General Gait Details: Required 50% VC's on proper sequencing as well as walker advancment due to excessive lean/WBing onto walker.  Second assist following with recliner as safety.  Limited distance 12 feet with 3/10 pain.  Very small shuffled steps.  Slight anxiety.  HIGH FALL RISK. Pt will need ST Rehab at SNF to address mobility and functional decline prior to safely returning home.   Recommendations for follow up therapy are one component of a multi-disciplinary discharge planning process, led by the attending physician.  Recommendations may be updated based on patient status, additional functional criteria and insurance authorization.  Follow Up Recommendations  Skilled nursing-short term rehab (<3 hours/day) Can  patient physically be transported by private vehicle: Yes   Assistance Recommended at Discharge Frequent or constant Supervision/Assistance  Patient can return home with the following A little help with walking and/or transfers;A little help with bathing/dressing/bathroom;Assistance with cooking/housework;Assist for transportation;Help with stairs or ramp for entrance   Equipment Recommendations  Rolling walker (2 wheels)    Recommendations for Other Services       Precautions / Restrictions Precautions Precautions: Fall Restrictions Weight Bearing Restrictions: No LLE Weight Bearing: Weight bearing as tolerated     Mobility  Bed Mobility Overal bed mobility: Needs Assistance Bed Mobility: Supine to Sit     Supine to sit: Max assist     General bed mobility comments: attempted to semonstarte how to use baler to self asisst L LE off bed however unable to self complete.  Required Max Assist to scoot to EOB using bed pad.    Transfers Overall transfer level: Needs assistance Equipment used: Rolling walker (2 wheels), None Transfers: Sit to/from Stand Sit to Stand: Min assist, Mod assist           General transfer comment: 75% VC's on proper hand placement transfering from bed to [facility] 1/4 pivot without use of walker then 50% vc's on proper hand placement with sit to stand onto walker after using BSC.  Unsteady.  Required asisst with peri care as pt was unable to self balance and self perform.  Pt voided and had a BM.  NT in room and assisted/recorded.  Ambulation/Gait Ambulation/Gait assistance: Min assist, Mod assist Gait Distance (Feet): 12 Feet Assistive device: Rolling walker (2 wheels) Gait Pattern/deviations: Step-to pattern, Decreased step length - right, Decreased step length - left, Shuffle, Trunk flexed Gait velocity: decr     General Gait Details: Required 50% VC's on proper sequencing as well as walker advancment due to excessive lean/WBing onto walker.   Second assist following with recliner as safety.  Limited distance 12 feet with 3/10 pain.  Very small shuffled steps.  Slight anxiety.  HIGH FALL RISK.   Stairs             Wheelchair Mobility    Modified Rankin (Stroke Patients Only)       Balance                                            Cognition Arousal/Alertness: Awake/alert Behavior During Therapy: WFL for tasks assessed/performed Overall Cognitive Status: Within Functional Limits for tasks assessed                                 General Comments: AxO x 3 very pleasant Lady who fell at [facility] and will need ST Rehab at [facility].  Pt also cares for her elder Paula Ward who is 84 years old.  Family arranging care for him.        Exercises      General Comments        Pertinent Vitals/Pain Pain Assessment Pain Assessment: 0-10 Pain Score: 3  Pain Location: L hip with activity "Tylenol only" Pain Descriptors / Indicators: Aching, Sore Pain Intervention(s): Monitored during session, Premedicated before session, Repositioned, Ice applied    Home Living                          Prior Function            PT Goals (current goals can now be found in the care plan section) Progress towards PT goals: Progressing toward goals    Frequency    Min 5X/week      PT Plan Current plan remains appropriate    Co-evaluation              AM-PAC PT "6 Clicks" Mobility   Outcome Measure  Help needed turning from your back to your side while in a flat bed without using bedrails?: A Little Help needed moving from lying on your back to sitting on the side of a flat bed without using bedrails?: A Little Help needed moving to and from a bed to a chair (including a wheelchair)?: A Little Help needed standing up from a chair using your arms (e.g., wheelchair or bedside chair)?: A Lot Help needed to walk in hospital room?: A Lot Help needed climbing 3-5 steps with a railing?  : Total 6 Click Score: 14    End of Session Equipment Utilized During Treatment: Gait belt Activity Tolerance: Patient limited by fatigue Patient left: in chair;with call bell/phone within reach;with chair alarm set;with family/visitor present Nurse Communication: Mobility status PT Visit Diagnosis: Muscle weakness (generalized) (M62.81);Difficulty in walking, not elsewhere classified (R26.2)     Time: 1010-1035 PT Time Calculation (min) (ACUTE ONLY): 25 min  Charges:  $Gait Training: 8-22 mins $Therapeutic Activity: 8-22 mins  Rica Koyanagi  PTA Acute  Rehabilitation Services Office M-F          [phone] Weekend pager [phone]

## 2022-10-14 NOTE — Progress Notes (Signed)
Occupational Therapy Treatment Patient Details Name: Paula Ward MRN: [id number] DOB: 17-Sep-1936 Today's Date: 10/14/2022   History of present illness Patient is a 86 year old female who s/p fall with L hip fx and now s/p IM nail.  pt with hx of Breast CA, AVM, CKD, PE s/p IVC, pulmonary HTN, Sceroderma, interstitial lung disease and chronic respiratory failure on 2L O2 at night   OT comments  Patient was noted to make progress towards short term goals. Patient was able to maintain standing balance with one UE support to engage in hygiene tasks for toileting. Patient would continue to benefit from skilled OT services at this time while admitted and after d/c to address noted deficits in order to improve overall safety and independence in ADLs. Patient's discharge plan remains appropriate at this time. OT will continue to follow acutely.      Recommendations for follow up therapy are one component of a multi-disciplinary discharge planning process, led by the attending physician.  Recommendations may be updated based on patient status, additional functional criteria and insurance authorization.    Follow Up Recommendations  Skilled nursing-short term rehab (<3 hours/day)     Assistance Recommended at Discharge Intermittent Supervision/Assistance  Patient can return home with the following  A little help with walking and/or transfers;Assistance with cooking/housework;Direct supervision/assist for medications management;Assist for transportation;Help with stairs or ramp for entrance;Direct supervision/assist for financial management;A lot of help with bathing/dressing/bathroom   Equipment Recommendations  Other (comment) (RW)    Recommendations for Other Services      Precautions / Restrictions Precautions Precautions: Fall Restrictions Weight Bearing Restrictions: No LLE Weight Bearing: Weight bearing as tolerated       Mobility Bed Mobility Overal bed mobility: Needs  Assistance Bed Mobility: Sit to Supine     Supine to sit: Min assist     General bed mobility comments: to transition BLE into bed with patient attempted to use gait belt as leg lifter.    Transfers                         Balance                                           ADL either performed or assessed with clinical judgement   ADL Overall ADL's : Needs assistance/impaired                         Toilet Transfer: Minimal assistance;Ambulation;Rolling walker (2 wheels) Toilet Transfer Details (indicate cue type and reason): with education on proper transitions for sit to stand. Toileting- Clothing Manipulation and Hygiene: Minimal assistance;Sit to/from stand Toileting - Clothing Manipulation Details (indicate cue type and reason): with education on front to back and importance of trying to compelte own hygiene prior to asking others to complete            Extremity/Trunk Assessment              Vision       Perception     Praxis      Cognition Arousal/Alertness: Awake/alert Behavior During Therapy: WFL for tasks assessed/performed Overall Cognitive Status: Within Functional Limits for tasks assessed  Exercises Other Exercises Other Exercises: patient was able to complete 5 reps of sit to stands from bed with RW with increased time after education on proper sequencing. patient did need encouragement to continue during session    Shoulder Instructions       General Comments      Pertinent Vitals/ Pain       Pain Assessment Pain Assessment: Faces Faces Pain Scale: Hurts little more Pain Location: L knee with transitions Pain Descriptors / Indicators: Aching, Sore Pain Intervention(s): Limited activity within patient's tolerance, Monitored during session, Premedicated before session, Repositioned  Home Living                                           Prior Functioning/Environment              Frequency  Min 2X/week        Progress Toward Goals  OT Goals(current goals can now be found in the care plan section)  Progress towards OT goals: Progressing toward goals     Plan Discharge plan remains appropriate    Co-evaluation                 AM-PAC OT "6 Clicks" Daily Activity     Outcome Measure   Help from another person eating meals?: None Help from another person taking care of personal grooming?: A Little Help from another person toileting, which includes using toliet, bedpan, or urinal?: A Lot Help from another person bathing (including washing, rinsing, drying)?: A Lot Help from another person to put on and taking off regular upper body clothing?: A Little Help from another person to put on and taking off regular lower body clothing?: A Lot 6 Click Score: 16    End of Session Equipment Utilized During Treatment: Gait belt;Rolling walker (2 wheels)  OT Visit Diagnosis: Unsteadiness on feet (R26.81);Muscle weakness (generalized) (M62.81);History of falling (Z91.81);Other abnormalities of gait and mobility (R26.89);Pain Pain - Right/Left: Right Pain - part of body: Leg   Activity Tolerance Patient tolerated treatment well   Patient Left in chair;with call bell/phone within reach;with chair alarm set   Nurse Communication Mobility status        Time: 2334-3568 OT Time Calculation (min): 27 min  Charges: OT General Charges $OT Visit: 1 Visit OT Treatments $Self Care/Home Management : 23-37 mins  [name], MS Acute Rehabilitation Department Office# [phone]   [name] 10/14/2022, 3:30 PM

## 2022-10-15 DIAGNOSIS — I10 Essential (primary) hypertension: Secondary | ICD-10-CM | POA: Diagnosis not present

## 2022-10-15 DIAGNOSIS — S72142D Displaced intertrochanteric fracture of left femur, subsequent encounter for closed fracture with routine healing: Secondary | ICD-10-CM | POA: Diagnosis not present

## 2022-10-15 DIAGNOSIS — R278 Other lack of coordination: Secondary | ICD-10-CM | POA: Diagnosis not present

## 2022-10-15 DIAGNOSIS — R2689 Other abnormalities of gait and mobility: Secondary | ICD-10-CM | POA: Diagnosis not present

## 2022-10-15 DIAGNOSIS — I5032 Chronic diastolic (congestive) heart failure: Secondary | ICD-10-CM | POA: Diagnosis not present

## 2022-10-15 DIAGNOSIS — M6281 Muscle weakness (generalized): Secondary | ICD-10-CM | POA: Diagnosis not present

## 2022-10-15 DIAGNOSIS — Z5189 Encounter for other specified aftercare: Secondary | ICD-10-CM | POA: Diagnosis not present

## 2022-10-15 DIAGNOSIS — T8131XA Disruption of external operation (surgical) wound, not elsewhere classified, initial encounter: Secondary | ICD-10-CM | POA: Diagnosis not present

## 2022-10-15 DIAGNOSIS — D649 Anemia, unspecified: Secondary | ICD-10-CM | POA: Diagnosis not present

## 2022-10-15 DIAGNOSIS — I272 Pulmonary hypertension, unspecified: Secondary | ICD-10-CM | POA: Diagnosis not present

## 2022-10-15 DIAGNOSIS — R2242 Localized swelling, mass and lump, left lower limb: Secondary | ICD-10-CM | POA: Diagnosis not present

## 2022-10-15 DIAGNOSIS — Z7401 Bed confinement status: Secondary | ICD-10-CM | POA: Diagnosis not present

## 2022-10-15 DIAGNOSIS — G8911 Acute pain due to trauma: Secondary | ICD-10-CM | POA: Diagnosis not present

## 2022-10-15 DIAGNOSIS — M25562 Pain in left knee: Secondary | ICD-10-CM | POA: Diagnosis not present

## 2022-10-15 DIAGNOSIS — Z86711 Personal history of pulmonary embolism: Secondary | ICD-10-CM | POA: Diagnosis not present

## 2022-10-15 DIAGNOSIS — I509 Heart failure, unspecified: Secondary | ICD-10-CM | POA: Diagnosis not present

## 2022-10-15 DIAGNOSIS — S72002D Fracture of unspecified part of neck of left femur, subsequent encounter for closed fracture with routine healing: Secondary | ICD-10-CM | POA: Diagnosis not present

## 2022-10-15 DIAGNOSIS — Z7689 Persons encountering health services in other specified circumstances: Secondary | ICD-10-CM | POA: Diagnosis not present

## 2022-10-15 DIAGNOSIS — J309 Allergic rhinitis, unspecified: Secondary | ICD-10-CM | POA: Diagnosis not present

## 2022-10-15 DIAGNOSIS — Z9181 History of falling: Secondary | ICD-10-CM | POA: Diagnosis not present

## 2022-10-15 DIAGNOSIS — J9611 Chronic respiratory failure with hypoxia: Secondary | ICD-10-CM | POA: Diagnosis not present

## 2022-10-15 DIAGNOSIS — M160 Bilateral primary osteoarthritis of hip: Secondary | ICD-10-CM | POA: Diagnosis not present

## 2022-10-15 LAB — BASIC METABOLIC PANEL
Anion gap: 4 — ABNORMAL LOW (ref 5–15)
BUN: 38 mg/dL — ABNORMAL HIGH (ref 8–23)
CO2: 27 mmol/L (ref 22–32)
Calcium: 8.5 mg/dL — ABNORMAL LOW (ref 8.9–10.3)
Chloride: 109 mmol/L (ref 98–111)
Creatinine, Ser: 1.11 mg/dL — ABNORMAL HIGH (ref 0.44–1.00)
GFR, Estimated: 49 mL/min — ABNORMAL LOW (ref >60)
Glucose, Bld: 97 mg/dL (ref 70–99)
Potassium: 4.3 mmol/L (ref 3.5–5.1)
Sodium: 140 mmol/L (ref 135–145)

## 2022-10-15 MED ORDER — METHOCARBAMOL 500 MG PO TABS: 500.0000 mg | ORAL_TABLET | Freq: Four times a day (QID) | ORAL | 0 refills | Status: DC | PRN

## 2022-10-15 MED ORDER — APIXABAN 2.5 MG PO TABS: 2.5000 mg | ORAL_TABLET | Freq: Two times a day (BID) | ORAL | 0 refills | Status: AC

## 2022-10-15 MED ORDER — DOCUSATE SODIUM 100 MG PO CAPS: 100.0000 mg | ORAL_CAPSULE | Freq: Two times a day (BID) | ORAL | 0 refills | Status: DC

## 2022-10-15 MED ORDER — POLYETHYLENE GLYCOL 3350 17 G PO PACK: 17.0000 g | PACK | Freq: Every day | ORAL | 0 refills | Status: DC

## 2022-10-15 MED ORDER — HYDROCODONE-ACETAMINOPHEN 5-325 MG PO TABS: 1.0000 | ORAL_TABLET | Freq: Four times a day (QID) | ORAL | 0 refills | Status: DC | PRN

## 2022-10-15 MED ORDER — ONDANSETRON HCL 4 MG PO TABS: 4.0000 mg | ORAL_TABLET | Freq: Four times a day (QID) | ORAL | 0 refills | Status: DC | PRN

## 2022-10-15 NOTE — TOC Transition Note (Signed)
Transition of Care [facility]) - CM/SW Discharge Note   Patient Details  Name: Paula Ward MRN: [id number] Date of Birth: 28-Jun-1936  Transition of Care [facility]) CM/SW Contact:  Lennart Pall, LCSW Phone Number: 10/15/2022, 1:28 PM   Clinical Narrative:    Pt medically cleared for dc today to [facility] SNF and have received insurance authorization.  PTAR called at 1:20 pm.  RN to call report to [phone].  No further TOC needs.   Final next level of care: [place] Barriers to Discharge: Barriers Resolved   Patient Goals and CMS Choice   Choice offered to / list presented to : Patient  Discharge Placement PASRR number recieved: 10/12/22 PASRR number recieved: 10/12/22            Patient chooses bed at: [facility] Patient to be transferred to facility by: [place] Name of family member notified: spouse and daughter Patient and family notified of of transfer: 10/15/22  Discharge Plan and Services Additional resources added to the After Visit Summary for   In-house Referral: Clinical Social Work   Post Acute Care Choice: [place]          DME Arranged: N/A DME Agency: NA                  Social Determinants of Health (SDOH) Interventions SDOH Screenings   Food Insecurity: No Food Insecurity (10/11/2022)  Housing: Low Risk  (10/11/2022)  Transportation Needs: No Transportation Needs (10/11/2022)  Utilities: Not At Risk (10/11/2022)  Alcohol Screen: Low Risk  (04/02/2022)  Depression (PHQ2-9): Low Risk  (09/29/2022)  Financial Resource Strain: Low Risk  (04/02/2022)  Physical Activity: Inactive (04/02/2022)  Social Connections: Socially Integrated (04/02/2022)  Stress: No Stress Concern Present (04/02/2022)  Tobacco Use: Low Risk  (10/14/2022)     Readmission Risk Interventions    10/12/2022    9:05 AM 03/18/2022    3:45 PM  Readmission Risk Prevention Plan  Transportation Screening Complete Complete  PCP or Specialist Appt within  3-5 Days  Complete  HRI or [place]  Complete  Social Work Consult for [place] Planning/Counseling  [place]  Not Applicable  Medication Review Press photographer) Complete Complete  PCP or Specialist appointment within 3-5 days of discharge Complete   HRI or [place] Complete   SW Recovery Care/Counseling Consult Complete   Palliative Care Screening Not Applicable   Skilled Nursing Facility Complete

## 2022-10-15 NOTE — Progress Notes (Signed)
Occupational Therapy Treatment Patient Details Name: Paula Ward MRN: [id number] DOB: 1936/08/27 Today's Date: 10/15/2022   History of present illness Patient is a 86 year old female who s/p fall with L hip fx and now s/p IM nail.  pt with hx of Breast CA, AVM, CKD, PE s/p IVC, pulmonary HTN, Sceroderma, interstitial lung disease and chronic respiratory failure on 2L O2 at night   OT comments  Patient was noted to make progress towards goals. Patient was able to carryover sequencing education from previous session. Patient's discharge plan remains appropriate at this time. OT will continue to follow acutely.     Recommendations for follow up therapy are one component of a multi-disciplinary discharge planning process, led by the attending physician.  Recommendations may be updated based on patient status, additional functional criteria and insurance authorization.    Follow Up Recommendations  Skilled nursing-short term rehab (<3 hours/day)     Assistance Recommended at Discharge Intermittent Supervision/Assistance  Patient can return home with the following  A little help with walking and/or transfers;Assistance with cooking/housework;Direct supervision/assist for medications management;Assist for transportation;Help with stairs or ramp for entrance;Direct supervision/assist for financial management;A lot of help with bathing/dressing/bathroom   Equipment Recommendations  Other (comment) (RW)    Recommendations for Other Services      Precautions / Restrictions Precautions Precautions: Fall Restrictions Weight Bearing Restrictions: Yes LLE Weight Bearing: Weight bearing as tolerated       Mobility Bed Mobility Overal bed mobility: Needs Assistance Bed Mobility: Sit to Supine Rolling: Min assist         General bed mobility comments: to transition BLE into bed with patient attempted to use gait belt as leg lifter.           ADL either performed or assessed with  clinical judgement   ADL Overall ADL's : Needs assistance/impaired                         Toilet Transfer: Minimal assistance;Ambulation;Rolling walker (2 wheels) Toilet Transfer Details (indicate cue type and reason): with education on proper transitions for sit to stand. simulated patient did not need to use bathroom but transiitoned into bathroom                  Cognition Arousal/Alertness: Awake/alert Behavior During Therapy: WFL for tasks assessed/performed Overall Cognitive Status: Within Functional Limits for tasks assessed                                                     Pertinent Vitals/ Pain       Pain Assessment Pain Assessment: Faces Faces Pain Scale: Hurts little more Pain Location: L knee with transitions Pain Descriptors / Indicators: Aching, Sore Pain Intervention(s): Limited activity within patient's tolerance, Monitored during session         Frequency  Min 2X/week        Progress Toward Goals  OT Goals(current goals can now be found in the care plan section)  Progress towards OT goals: Progressing toward goals     Plan Discharge plan remains appropriate       AM-PAC OT "6 Clicks" Daily Activity     Outcome Measure   Help from another person eating meals?: None Help from another person taking care of personal grooming?: A Little Help  from another person toileting, which includes using toliet, bedpan, or urinal?: A Lot Help from another person bathing (including washing, rinsing, drying)?: A Lot Help from another person to put on and taking off regular upper body clothing?: A Little Help from another person to put on and taking off regular lower body clothing?: A Lot 6 Click Score: 16    End of Session Equipment Utilized During Treatment: Gait belt;Rolling walker (2 wheels)  OT Visit Diagnosis: Unsteadiness on feet (R26.81);Muscle weakness (generalized) (M62.81);History of falling (Z91.81);Other  abnormalities of gait and mobility (R26.89);Pain Pain - Right/Left: Right Pain - part of body: Leg   Activity Tolerance Patient tolerated treatment well   Patient Left in chair;with call bell/phone within reach;with chair alarm set   Nurse Communication Mobility status        Time: 5320-2334 OT Time Calculation (min): 18 min  Charges: OT General Charges $OT Visit: 1 Visit OT Treatments $Self Care/Home Management : 8-22 mins  Paula Plowman, MS Acute Rehabilitation Department Office# [phone]   Paula Ward 10/15/2022, 12:19 PM

## 2022-10-15 NOTE — Discharge Summary (Signed)
Physician Discharge Summary  Paula Ward RRN:[id number] DOB: [birth year] DOA: 10/10/2022  PCP: Hoyt Koch, MD  Admit date: 10/10/2022 Discharge date: 10/15/2022  Time spent: 45 minutes  Recommendations for Outpatient Follow-up:  Continue Eliquis for 3 weeks for DVT prophylaxis Orthopedics follow-up with Dr. Reynaldo Minium in 2 weeks  Discharge Diagnoses:  Principal Problem:   Closed comminuted intertrochanteric fracture of left femur [facility]) Active Problems:   Essential hypertension   Pulmonary hypertension (HCC)   Chronic diastolic heart failure (HCC)   ILD (interstitial lung disease) (HCC)   Chronic respiratory failure with hypoxia (HCC)   Pericardial effusion   Moderate aortic stenosis   CKD (chronic kidney disease) stage 3, GFR 30-59 ml/min (HCC)   Anemia, chronic disease   PULMONARY EMBOLISM, HX OF   GASTROESOPHAGEAL REFLUX DISEASE   Adjustment disorder   SCLERODERMA   Discharge Condition: Stable  Diet recommendation: Low-sodium, heart healthy  Filed Weights   10/11/22 0157  Weight: 49.2 kg    History of present illness:  Paula Ward is a 86 y.o. female with medical history significant of AMV, CKD, CREST syndrome, GERD, ILD, hx of PE s/p IVC, pulmonary HTN, chronic respiratory failure with 2L oxygen at night, hx of RCC who presented to ED after she fell and ahd left hip pain   Hospital Course:   Closed comminuted intertrochanteric fracture of left femur ([place]) S/p intramedullary nailing with ortho 12/23. Transfused 2 units of PRBC post op PT evaluation - SNF recommended.  - pharmacologic dvt ppx per ortho (Eliquis 2.5 mg PO BID x 3 weeks) -Follow-up with orthopedics in 2 weeks   Anemia, chronic disease Acute blood loss anemia - POD 1 Hbg 7.4 from 8.9 2 units pRBC's ordered for transfusion per ortho IV iron infusions q 6 weeks  Iron studies done 09/2022  12/26: Hemoglobin improved and stable above 11   Pulmonary hypertension (HCC) Chronic  Diastolic CHF -felt secondary to history of crest syndrome and interstitial lung disease as well as history of pulmonary embolus.   -She is followed by pulmonary and at [place]  -Continue home regimen of Lasix and Aldactone   ILD (interstitial lung disease) ([place]) Followed by pulm and at [place] therapy has been discussed previously at [facility] and not initiated due to the side effect profile  Continue 2L [place] oxygen at night    Chronic respiratory failure with hypoxia (HCC) On 2L oxygen at night only secondary to ILD  stable   Moderate aortic stenosis Moderate by echo in May 02/2022  LHC/RHC in 04/2022 with moderate AS not candidate for TAVR at that time    CKD (chronic kidney disease) stage 3, GFR 30-59 ml/min (HCC) Stable   PULMONARY EMBOLISM, HX OF History of IVC placed in 2012, will plan on lovenox ppx when ortho gives go ahead   GASTROESOPHAGEAL REFLUX DISEASE Continue protonix    Adjustment disorder - cont home citalopram    Code Status: DNR   Procedure OPERATIVE REPORT    PREOPERATIVE DIAGNOSIS: Left intertrochanteric femur fracture.   POSTOP DIAGNOSIS: Left intertrochanteric femur fracture.    PROCEDURE: Intramedullary nailing, Left intertrochanteric femur  fracture.    SURGEON: Gaynelle Arabian, M.D.      Discharge Exam: Vitals:   10/14/22 2252 10/15/22 0431  BP: (!) 116/57 (!) 135/51  Pulse: (!) 103 89  Resp: 20 17  Temp: 98.4 F (36.9 C) 97.6 F (36.4 C)  SpO2: 100% 99%   Gen: Awake, Alert, Oriented X 3,  HEENT: no JVD Lungs: Good air movement bilaterally, CTAB CVS: S1S2/RRR Abd: soft, Non tender, non distended, BS present Extremities: No edema Skin: no new rashes on exposed skin   Discharge Instructions   Discharge Instructions     Diet - low sodium heart healthy   Complete by: As directed    Increase activity slowly   Complete by: As directed       Allergies as of 10/15/2022       Reactions   Codeine Nausea Only    Hallucinations, too   Other Nausea And Vomiting   "-mycin" antibiotics.   Also cause hallucinations.   Erythromycin Nausea And Vomiting   Iodinated Contrast Media Other (See Comments)   Renal issues   Lisinopril    Pt doesn't remember    Mycophenolate Mofetil Nausea Only   Sulfa Antibiotics Other (See Comments)   unknown        Medication List     TAKE these medications    acetaminophen 325 MG tablet Commonly known as: Tylenol Take 2 tablets (650 mg total) by mouth every 6 (six) hours as needed for moderate pain.   albuterol 108 (90 Base) MCG/ACT inhaler Commonly known as: VENTOLIN HFA INHALE 2 PUFFS INTO THE LUNGS EVERY 6 HOURS AS NEEDED FOR WHEEZING OR SHORTNESS OF BREATH   apixaban 2.5 MG Tabs tablet Commonly known as: ELIQUIS Take 1 tablet (2.5 mg total) by mouth 2 (two) times daily for 17 days. Then take one 81 mg aspirin once a day for three weeks. Then discontinue aspirin.   citalopram 10 MG tablet Commonly known as: CELEXA Take 1 tablet (10 mg total) by mouth daily. What changed:  when to take this reasons to take this   diltiazem 120 MG 24 hr capsule Commonly known as: CARDIZEM CD Take 1 capsule (120 mg total) by mouth in the morning.   docusate sodium 100 MG capsule Commonly known as: COLACE Take 1 capsule (100 mg total) by mouth 2 (two) times daily.   fluticasone 50 MCG/ACT nasal spray Commonly known as: FLONASE Place 1 spray into both nostrils daily as needed for allergies. What changed: when to take this   furosemide 20 MG tablet Commonly known as: LASIX Take 1 tablet by mouth once daily   guaiFENesin 600 MG 12 hr tablet Commonly known as: MUCINEX Take 300 mg by mouth daily.   HYDROcodone-acetaminophen 5-325 MG tablet Commonly known as: NORCO/VICODIN Take 1-2 tablets by mouth every 6 (six) hours as needed for moderate pain.   ipratropium 0.03 % nasal spray Commonly known as: ATROVENT Place 2 sprays into both nostrils every 12 (twelve)  hours.   latanoprost 0.005 % ophthalmic solution Commonly known as: XALATAN Place 1 drop into both eyes at bedtime.   magic mouthwash Soln Take 10 mLs by mouth 4 (four) times daily as needed for mouth pain.   methocarbamol 500 MG tablet Commonly known as: ROBAXIN Take 1 tablet (500 mg total) by mouth every 6 (six) hours as needed for muscle spasms.   montelukast 10 MG tablet Commonly known as: SINGULAIR TAKE 1 TABLET BY MOUTH AT BEDTIME   multivitamin with minerals Tabs tablet Take 1 tablet by mouth daily.   mupirocin ointment 2 % Commonly known as: BACTROBAN Apply 1 Application topically 2 (two) times daily as needed (dry skin).   ondansetron 4 MG tablet Commonly known as: ZOFRAN Take 1 tablet (4 mg total) by mouth every 6 (six) hours as needed for nausea.   Opsumit 10 MG  tablet Generic drug: macitentan Take 10 mg by mouth daily.   OXYGEN Inhale into the lungs. 2 LMP at night and upon exertion   pantoprazole 40 MG tablet Commonly known as: Protonix Take 1 tablet (40 mg total) by mouth 2 (two) times daily before a meal.   polyethylene glycol 17 g packet Commonly known as: MIRALAX / GLYCOLAX Take 17 g by mouth daily.   spironolactone 25 MG tablet Commonly known as: ALDACTONE Take 12.5 mg by mouth daily.   sucralfate 1 GM/10ML suspension Commonly known as: CARAFATE Take 10 mLs (1 g total) by mouth every 6 (six) hours as needed (reflux).       Allergies  Allergen Reactions   Codeine Nausea Only    Hallucinations, too   Other Nausea And Vomiting    "-mycin" antibiotics.   Also cause hallucinations.   Erythromycin Nausea And Vomiting   Iodinated Contrast Media Other (See Comments)    Renal issues   Lisinopril     Pt doesn't remember    Mycophenolate Mofetil Nausea Only   Sulfa Antibiotics Other (See Comments)    unknown    Contact information for follow-up providers     Gaynelle Arabian, MD. Schedule an appointment as soon as possible for a visit in 2  week(s).   Specialty: Orthopedic Surgery Contact information: [address] [place] [address] [phone]              Contact information for after-discharge care     Destination     [facility] .   Service: Skilled Nursing Contact information: [address] [place] [phone]                      The results of significant diagnostics from this hospitalization (including imaging, microbiology, ancillary and laboratory) are listed below for reference.    Significant Diagnostic Studies: DG HIP UNILAT WITH PELVIS 1V LEFT  Result Date: 10/11/2022 CLINICAL DATA:  Fluoroscopic assistance for internal fixation EXAM: DG HIP (WITH OR WITHOUT PELVIS) 1V*L* COMPARISON:  10/10/2022 FINDINGS: Fluoroscopic images show internal fixation of comminuted intertrochanteric fracture of proximal left femur with intramedullary rod. Fluoroscopy time 24 seconds. Radiation dose 5.29 mGy. IMPRESSION: Fluoroscopic assistance was provided for internal fixation of fracture of proximal left femur. Electronically Signed   By: Elmer Picker M.D.   On: 10/11/2022 13:03   DG C-Arm 1-60 Min-No Report  Result Date: 10/11/2022 Fluoroscopy was utilized by the requesting physician.  No radiographic interpretation.   DG Chest Port 1 View  Result Date: 10/10/2022 CLINICAL DATA:  Fall, chest injury EXAM: PORTABLE CHEST 1 VIEW COMPARISON:  07/28/2022 FINDINGS: Opacification of the upper lung zones bilaterally likely relates to overlying breast tissue. No pneumothorax or pleural effusion. Moderate cardiomegaly is stable. No acute bone abnormality. Surgical clips are seen within the left breast and axilla. IMPRESSION: 1. No active disease. 2. Stable cardiomegaly. Electronically Signed   By: Fidela Salisbury M.D.   On: 10/10/2022 19:17   DG Pelvis Portable  Result Date: 10/10/2022 CLINICAL DATA:  Fall at [place] 1-2 VIEWS; LEFT FEMUR PORTABLE 2 VIEWS COMPARISON:  CT abdomen and pelvis 04/04/2019 FINDINGS: Acute comminuted intertrochanteric fracture of the left proximal femur. There is superior displacement of the fracture. Mildly displaced greater and lesser trochanters. The femoral head remains located within the acetabulum though appears rotated superiorly. No additional fractures. IMPRESSION: Comminuted left intertrochanteric fracture. Electronically Signed   By:  Placido Sou M.D.   On: 10/10/2022 19:06   DG Femur Portable Min 2 Views Left  Result Date: 10/10/2022 CLINICAL DATA:  Fall at [place]: PORTABLE PELVIS 1-2 VIEWS; LEFT FEMUR PORTABLE 2 VIEWS COMPARISON:  CT abdomen and pelvis 04/04/2019 FINDINGS: Acute comminuted intertrochanteric fracture of the left proximal femur. There is superior displacement of the fracture. Mildly displaced greater and lesser trochanters. The femoral head remains located within the acetabulum though appears rotated superiorly. No additional fractures. IMPRESSION: Comminuted left intertrochanteric fracture. Electronically Signed   By: Placido Sou M.D.   On: 10/10/2022 19:06    Microbiology: No results found for this or any previous visit (from the past 240 hour(s)).   Labs: Basic Metabolic Panel: Recent Labs  Lab 10/11/22 0404 10/12/22 0348 10/13/22 0402 10/14/22 0334 10/15/22 0415  NA 137 137 136 138 140  K 4.4 4.9 4.2 4.1 4.3  CL 104 103 107 108 109  CO2 _0 GLUCOSE 133* 112* 98 102* 97  BUN 37* 45* 44* 38* 38*  CREATININE 1.40* 1.52* 1.24* 1.06* 1.11*  CALCIUM 8.6* 8.2* 8.4* 8.3* 8.5*   Liver Function Tests: Recent Labs  Lab 10/10/22 1828  AST 24  ALT 14  ALKPHOS 54  BILITOT 0.8  PROT 6.6  ALBUMIN 4.0   No results for input(s): "LIPASE", "AMYLASE" in the last 168 hours. No results for input(s): "AMMONIA" in the last 168 hours. CBC: Recent Labs  Lab 10/10/22 1828 10/11/22 0404 10/12/22 0348 10/13/22 1225  10/13/22 1339 10/14/22 0334  WBC 7.0 8.9 9.2 8.1  --  7.0  NEUTROABS 5.8  --   --   --   --   --   HGB 10.1* 8.7* 7.4* 11.5* 11.7* 11.4*  HCT 31.7* 27.2* 23.5* 35.3* 35.7* 35.7*  MCV 112.4* 112.4* 115.2* 95.9  --  98.3  PLT 178 168 161 174  --  187   Cardiac Enzymes: No results for input(s): "CKTOTAL", "CKMB", "CKMBINDEX", "TROPONINI" in the last 168 hours. BNP: BNP (last 3 results) Recent Labs    03/17/22 1622  BNP 2,025.3*    ProBNP (last 3 results) No results for input(s): "PROBNP" in the last 8760 hours.  CBG: No results for input(s): "GLUCAP" in the last 168 hours.     Signed:  Domenic Polite MD.  [facility] 10/15/2022, 1:03 PM

## 2022-10-15 NOTE — Plan of Care (Signed)
  Problem: Activity: Goal: Risk for activity intolerance will decrease Outcome: Progressing   Problem: Pain Managment: Goal: General experience of comfort will improve Outcome: Progressing   Problem: Safety: Goal: Ability to remain free from injury will improve Outcome: Progressing

## 2022-10-15 NOTE — Progress Notes (Signed)
Called [place] and report given to nurse Vicky.

## 2022-10-15 NOTE — Progress Notes (Signed)
Subjective: 4 Days Post-Op Procedure(s) (LRB): INTRAMEDULLARY (IM) NAIL INTERTROCHANTERIC (Left) Patient reports pain as mild.   Patient seen in rounds for Dr. Wynelle Link. Patient is doing well, only requiring tylenol for pain. Reports discomfort in the left knee, has known arthritis. Discussed that the traction from surgery likely aggravated this, but the tylenol is helping manage. No issues overnight. Denies chest pain or SOB.   Objective: Vital signs in last 24 hours: Temp:  [97.6 F (36.4 C)-98.4 F (36.9 C)] 97.6 F (36.4 C) (12/27 0431) Pulse Rate:  [89-103] 89 (12/27 0431) Resp:  [17-20] 17 (12/27 0431) BP: (116-135)/(51-57) 135/51 (12/27 0431) SpO2:  [98 %-100 %] 99 % (12/27 0431)  Intake/Output from previous day:  Intake/Output Summary (Last 24 hours) at 10/15/2022 0744 Last data filed at 10/15/2022 0600 Gross per 24 hour  Intake 780 ml  Output --  Net 780 ml    Intake/Output this shift: No intake/output data recorded.  Labs: Recent Labs    10/13/22 1225 10/13/22 1339 10/14/22 0334  HGB 11.5* 11.7* 11.4*   Recent Labs    10/13/22 1225 10/13/22 1339 10/14/22 0334  WBC 8.1  --  7.0  RBC 3.68*  --  3.63*  HCT 35.3* 35.7* 35.7*  PLT 174  --  187   Recent Labs    10/14/22 0334 10/15/22 0415  NA 138 140  K 4.1 4.3  CL 108 109  CO2 24 27  BUN 38* 38*  CREATININE 1.06* 1.11*  GLUCOSE 102* 97  CALCIUM 8.3* 8.5*   No results for input(s): "LABPT", "INR" in the last 72 hours.  Exam: General - Patient is Alert and Oriented Extremity - Neurologically intact Neurovascular intact Sensation intact distally Dorsiflexion/Plantar flexion intact Dressing/Incision - Aquacel dressings clean and intact. Minimal bloody drainage on the distal bandage.  Motor Function - intact, moving foot and toes well on exam.   Past Medical History:  Diagnosis Date   Anemia    Angiodysplasia of stomach and duodenum    Arthus phenomenon    AVM (arteriovenous  malformation)    Blood transfusion without reported diagnosis    last 4 units 12-22-15, Iron infusion x2 last -01-07-16,01-14-16.   Breast cancer ([place]) 1989   Left   Candida esophagitis (HCC)    Cataract    Chronic kidney disease    Chronic mild renal insuffiency   CREST syndrome (HCC)    GERD (gastroesophageal reflux disease)    w/ HH   Interstitial lung disease (HCC)    Nodule of kidney    Pericardial effusion    PONV (postoperative nausea and vomiting)    Pulmonary embolus ([place]) 2003   Pulmonary hypertension ([place])    followed by Dr Gaynell Face at [facility], now Dr. Maryjean Ka visit end 2'17.   Rectal incontinence    Renal cell carcinoma (HCC)    Scleroderma (HCC)    Tubular adenoma of colon    Uterine polyp     Assessment/Plan: 4 Days Post-Op Procedure(s) (LRB): INTRAMEDULLARY (IM) NAIL INTERTROCHANTERIC (Left) Principal Problem:   Closed comminuted intertrochanteric fracture of left femur (HCC) Active Problems:   Essential hypertension   Pulmonary hypertension (HCC)   GASTROESOPHAGEAL REFLUX DISEASE   SCLERODERMA   ILD (interstitial lung disease) (HCC)   PULMONARY EMBOLISM, HX OF   CKD (chronic kidney disease) stage 3, GFR 30-59 ml/min (HCC)   Pericardial effusion   Anemia, chronic disease   Chronic diastolic heart failure (HCC)   Moderate aortic stenosis   Adjustment disorder  Chronic respiratory failure with hypoxia (HCC)  Estimated body mass index is 19.22 kg/m as calculated from the following:   Height as of this encounter: 5' 2.99" (1.6 m).   Weight as of this encounter: 49.2 kg. Up with therapy  DVT Prophylaxis -  Eliquis Weight-bearing as tolerated  Plan for discharge to SNF once bed available (hopefully today).  Scripts for hydrocodone and DVT prophylaxis printed and placed in chart. Hemoglobin 11.4 this AM.  Follow-up in the office with Dr. Wynelle Link in 2 weeks.   Theresa Duty, PA-C Orthopedic Surgery [phone] 10/15/2022, 7:44 AM

## 2022-10-16 DIAGNOSIS — S72002D Fracture of unspecified part of neck of left femur, subsequent encounter for closed fracture with routine healing: Secondary | ICD-10-CM | POA: Diagnosis not present

## 2022-10-16 DIAGNOSIS — I10 Essential (primary) hypertension: Secondary | ICD-10-CM | POA: Diagnosis not present

## 2022-10-16 DIAGNOSIS — J9611 Chronic respiratory failure with hypoxia: Secondary | ICD-10-CM | POA: Diagnosis not present

## 2022-10-16 DIAGNOSIS — I272 Pulmonary hypertension, unspecified: Secondary | ICD-10-CM | POA: Diagnosis not present

## 2022-10-17 ENCOUNTER — Inpatient Hospital Stay (HOSPITAL_COMMUNITY): Admission: RE | Admit: 2022-10-17 | Payer: Medicare PPO | Source: Ambulatory Visit

## 2022-10-17 DIAGNOSIS — T8131XA Disruption of external operation (surgical) wound, not elsewhere classified, initial encounter: Secondary | ICD-10-CM | POA: Diagnosis not present

## 2022-10-17 DIAGNOSIS — I5032 Chronic diastolic (congestive) heart failure: Secondary | ICD-10-CM | POA: Diagnosis not present

## 2022-10-17 DIAGNOSIS — D649 Anemia, unspecified: Secondary | ICD-10-CM | POA: Diagnosis not present

## 2022-10-17 DIAGNOSIS — I10 Essential (primary) hypertension: Secondary | ICD-10-CM | POA: Diagnosis not present

## 2022-10-22 DIAGNOSIS — M25562 Pain in left knee: Secondary | ICD-10-CM | POA: Diagnosis not present

## 2022-10-23 ENCOUNTER — Other Ambulatory Visit: Payer: Self-pay | Admitting: Pharmacy Technician

## 2022-10-24 DIAGNOSIS — S72002D Fracture of unspecified part of neck of left femur, subsequent encounter for closed fracture with routine healing: Secondary | ICD-10-CM | POA: Diagnosis not present

## 2022-10-24 DIAGNOSIS — D649 Anemia, unspecified: Secondary | ICD-10-CM | POA: Diagnosis not present

## 2022-10-24 DIAGNOSIS — M25562 Pain in left knee: Secondary | ICD-10-CM | POA: Diagnosis not present

## 2022-10-24 DIAGNOSIS — I5032 Chronic diastolic (congestive) heart failure: Secondary | ICD-10-CM | POA: Diagnosis not present

## 2022-10-24 DIAGNOSIS — Z7689 Persons encountering health services in other specified circumstances: Secondary | ICD-10-CM | POA: Diagnosis not present

## 2022-10-24 DIAGNOSIS — I10 Essential (primary) hypertension: Secondary | ICD-10-CM | POA: Diagnosis not present

## 2022-10-27 ENCOUNTER — Encounter (HOSPITAL_COMMUNITY): Payer: Self-pay

## 2022-10-27 DIAGNOSIS — Z5189 Encounter for other specified aftercare: Secondary | ICD-10-CM | POA: Diagnosis not present

## 2022-10-29 DIAGNOSIS — I5032 Chronic diastolic (congestive) heart failure: Secondary | ICD-10-CM | POA: Diagnosis not present

## 2022-10-29 DIAGNOSIS — Z9181 History of falling: Secondary | ICD-10-CM | POA: Diagnosis not present

## 2022-10-29 DIAGNOSIS — I10 Essential (primary) hypertension: Secondary | ICD-10-CM | POA: Diagnosis not present

## 2022-10-29 DIAGNOSIS — S72002D Fracture of unspecified part of neck of left femur, subsequent encounter for closed fracture with routine healing: Secondary | ICD-10-CM | POA: Diagnosis not present

## 2022-10-29 DIAGNOSIS — M6281 Muscle weakness (generalized): Secondary | ICD-10-CM | POA: Diagnosis not present

## 2022-10-29 DIAGNOSIS — D649 Anemia, unspecified: Secondary | ICD-10-CM | POA: Diagnosis not present

## 2022-10-29 DIAGNOSIS — R2689 Other abnormalities of gait and mobility: Secondary | ICD-10-CM | POA: Diagnosis not present

## 2022-11-07 DIAGNOSIS — H401421 Capsular glaucoma with pseudoexfoliation of lens, left eye, mild stage: Secondary | ICD-10-CM | POA: Diagnosis not present

## 2022-11-07 DIAGNOSIS — H401413 Capsular glaucoma with pseudoexfoliation of lens, right eye, severe stage: Secondary | ICD-10-CM | POA: Diagnosis not present

## 2022-11-10 DIAGNOSIS — I130 Hypertensive heart and chronic kidney disease with heart failure and stage 1 through stage 4 chronic kidney disease, or unspecified chronic kidney disease: Secondary | ICD-10-CM | POA: Diagnosis not present

## 2022-11-10 DIAGNOSIS — J9611 Chronic respiratory failure with hypoxia: Secondary | ICD-10-CM | POA: Diagnosis not present

## 2022-11-10 DIAGNOSIS — M341 CR(E)ST syndrome: Secondary | ICD-10-CM | POA: Diagnosis not present

## 2022-11-10 DIAGNOSIS — I272 Pulmonary hypertension, unspecified: Secondary | ICD-10-CM | POA: Diagnosis not present

## 2022-11-10 DIAGNOSIS — J849 Interstitial pulmonary disease, unspecified: Secondary | ICD-10-CM | POA: Diagnosis not present

## 2022-11-10 DIAGNOSIS — N183 Chronic kidney disease, stage 3 unspecified: Secondary | ICD-10-CM | POA: Diagnosis not present

## 2022-11-10 DIAGNOSIS — I5032 Chronic diastolic (congestive) heart failure: Secondary | ICD-10-CM | POA: Diagnosis not present

## 2022-11-10 DIAGNOSIS — D631 Anemia in chronic kidney disease: Secondary | ICD-10-CM | POA: Diagnosis not present

## 2022-11-10 DIAGNOSIS — S72142D Displaced intertrochanteric fracture of left femur, subsequent encounter for closed fracture with routine healing: Secondary | ICD-10-CM | POA: Diagnosis not present

## 2022-11-11 ENCOUNTER — Ambulatory Visit: Payer: Medicare PPO | Admitting: Internal Medicine

## 2022-11-11 VITALS — BP 116/60 | HR 90 | Temp 97.5°F | Wt 103.0 lb

## 2022-11-11 DIAGNOSIS — I10 Essential (primary) hypertension: Secondary | ICD-10-CM

## 2022-11-11 DIAGNOSIS — N1832 Chronic kidney disease, stage 3b: Secondary | ICD-10-CM | POA: Diagnosis not present

## 2022-11-11 DIAGNOSIS — D638 Anemia in other chronic diseases classified elsewhere: Secondary | ICD-10-CM

## 2022-11-11 LAB — COMPREHENSIVE METABOLIC PANEL
ALT: 7 U/L (ref 0–35)
AST: 17 U/L (ref 0–37)
Albumin: 3.8 g/dL (ref 3.5–5.2)
Alkaline Phosphatase: 105 U/L (ref 39–117)
BUN: 45 mg/dL — ABNORMAL HIGH (ref 6–23)
CO2: 31 mEq/L (ref 19–32)
Calcium: 9.5 mg/dL (ref 8.4–10.5)
Chloride: 100 mEq/L (ref 96–112)
Creatinine, Ser: 1.19 mg/dL (ref 0.40–1.20)
GFR: 42.07 mL/min — ABNORMAL LOW (ref >60.00)
Glucose, Bld: 88 mg/dL (ref 70–99)
Potassium: 4.1 mEq/L (ref 3.5–5.1)
Sodium: 140 mEq/L (ref 135–145)
Total Bilirubin: 0.6 mg/dL (ref 0.2–1.2)
Total Protein: 6.7 g/dL (ref 6.0–8.3)

## 2022-11-11 LAB — CBC
HCT: 35.3 % — ABNORMAL LOW (ref 36.0–46.0)
Hemoglobin: 11.7 g/dL — ABNORMAL LOW (ref 12.0–15.0)
MCHC: 33.2 g/dL (ref 30.0–36.0)
MCV: 96.5 fl (ref 78.0–100.0)
Platelets: 281 10*3/uL (ref 150.0–400.0)
RBC: 3.65 Mil/uL — ABNORMAL LOW (ref 3.87–5.11)
RDW: 22 % — ABNORMAL HIGH (ref 11.5–15.5)
WBC: 6 10*3/uL (ref 4.0–10.5)

## 2022-11-11 MED ORDER — DILTIAZEM HCL ER 60 MG PO CP12: 60.0000 mg | ORAL_CAPSULE | Freq: Every day | ORAL | 1 refills | Status: DC

## 2022-11-11 NOTE — Patient Instructions (Signed)
We will lower the dose of diltiazem in half to 60 mg daily. We have sent in a new prescription.

## 2022-11-11 NOTE — Progress Notes (Signed)
   Subjective:   Patient ID: Paula Ward, female    DOB: 1936-09-20, 87 y.o.   MRN: [id number]  HPI The patient is an 87 YO female coming in for hospital and rehab follow up (in for fractured hip, had surgery and then went to rehab). She is now home and overall doing well. Pain is minor. No constipation. Eating well. Working with Pt/OT.   PMH, [facility], social history reviewed and updated  Review of Systems  Constitutional:  Positive for activity change, appetite change and fatigue.  HENT: Negative.    Eyes: Negative.   Respiratory:  Negative for cough, chest tightness and shortness of breath.   Cardiovascular:  Negative for chest pain, palpitations and leg swelling.  Gastrointestinal:  Negative for abdominal distention, abdominal pain, constipation, diarrhea, nausea and vomiting.  Musculoskeletal:  Positive for gait problem.  Skin: Negative.   Neurological:  Positive for dizziness.  Psychiatric/Behavioral: Negative.      Objective:  Physical Exam Constitutional:      Appearance: She is well-developed.  HENT:     Head: Normocephalic and atraumatic.  Cardiovascular:     Rate and Rhythm: Normal rate and regular rhythm.  Pulmonary:     Effort: Pulmonary effort is normal. No respiratory distress.     Breath sounds: Normal breath sounds. No wheezing or rales.  Abdominal:     General: Bowel sounds are normal. There is no distension.     Palpations: Abdomen is soft.     Tenderness: There is no abdominal tenderness. There is no rebound.  Musculoskeletal:     Cervical back: Normal range of motion.  Skin:    General: Skin is warm and dry.  Neurological:     Mental Status: She is alert and oriented to person, place, and time.     Coordination: Coordination abnormal.     Vitals:   11/11/22 0819  BP: 116/60  Pulse: 90  Temp: (!) 97.5 F (36.4 C)  TempSrc: Oral  SpO2: 95%  Weight: 103 lb (46.7 kg)    Assessment & Plan:  Visit time 20 minutes in face to face communication  with patient and coordination of care, additional 10 minutes spent in record review, coordination or care, ordering tests, communicating/referring to other healthcare professionals, documenting in medical records all on the same day of the visit for total time 30 minutes spent on the visit.

## 2022-11-13 ENCOUNTER — Encounter: Payer: Self-pay | Admitting: Gastroenterology

## 2022-11-14 ENCOUNTER — Encounter: Payer: Self-pay | Admitting: Internal Medicine

## 2022-11-14 ENCOUNTER — Inpatient Hospital Stay (HOSPITAL_COMMUNITY): Admit: 2022-11-14 | Payer: Medicare PPO

## 2022-11-14 NOTE — Assessment & Plan Note (Signed)
BP running low since surgery and she is getting dizzy. We do not want to risk another fall so will reduce diltiazem 120 mg daily to 60 mg daily. Continue lasix and spironolactone. We may need to adjust diltiazem back to normal once she is improving.

## 2022-11-14 NOTE — Assessment & Plan Note (Signed)
Checking CMP for stability. Some worsening acutely in hospital around surgery.

## 2022-11-14 NOTE — Assessment & Plan Note (Signed)
With worsening due to blood loss with recent hip fracture and surgery. Got 2 units PRBC in hospital. Needs follow up CBC today which is ordered.

## 2022-11-19 DIAGNOSIS — I5032 Chronic diastolic (congestive) heart failure: Secondary | ICD-10-CM | POA: Diagnosis not present

## 2022-11-19 DIAGNOSIS — N183 Chronic kidney disease, stage 3 unspecified: Secondary | ICD-10-CM | POA: Diagnosis not present

## 2022-11-19 DIAGNOSIS — J9611 Chronic respiratory failure with hypoxia: Secondary | ICD-10-CM | POA: Diagnosis not present

## 2022-11-19 DIAGNOSIS — I272 Pulmonary hypertension, unspecified: Secondary | ICD-10-CM | POA: Diagnosis not present

## 2022-11-19 DIAGNOSIS — D631 Anemia in chronic kidney disease: Secondary | ICD-10-CM | POA: Diagnosis not present

## 2022-11-19 DIAGNOSIS — J849 Interstitial pulmonary disease, unspecified: Secondary | ICD-10-CM | POA: Diagnosis not present

## 2022-11-19 DIAGNOSIS — I130 Hypertensive heart and chronic kidney disease with heart failure and stage 1 through stage 4 chronic kidney disease, or unspecified chronic kidney disease: Secondary | ICD-10-CM | POA: Diagnosis not present

## 2022-11-19 DIAGNOSIS — S72142D Displaced intertrochanteric fracture of left femur, subsequent encounter for closed fracture with routine healing: Secondary | ICD-10-CM | POA: Diagnosis not present

## 2022-11-19 DIAGNOSIS — M341 CR(E)ST syndrome: Secondary | ICD-10-CM | POA: Diagnosis not present

## 2022-11-21 DIAGNOSIS — S72142D Displaced intertrochanteric fracture of left femur, subsequent encounter for closed fracture with routine healing: Secondary | ICD-10-CM | POA: Diagnosis not present

## 2022-11-21 DIAGNOSIS — J9611 Chronic respiratory failure with hypoxia: Secondary | ICD-10-CM | POA: Diagnosis not present

## 2022-11-21 DIAGNOSIS — D631 Anemia in chronic kidney disease: Secondary | ICD-10-CM | POA: Diagnosis not present

## 2022-11-21 DIAGNOSIS — I272 Pulmonary hypertension, unspecified: Secondary | ICD-10-CM | POA: Diagnosis not present

## 2022-11-21 DIAGNOSIS — N183 Chronic kidney disease, stage 3 unspecified: Secondary | ICD-10-CM | POA: Diagnosis not present

## 2022-11-21 DIAGNOSIS — M341 CR(E)ST syndrome: Secondary | ICD-10-CM | POA: Diagnosis not present

## 2022-11-21 DIAGNOSIS — I130 Hypertensive heart and chronic kidney disease with heart failure and stage 1 through stage 4 chronic kidney disease, or unspecified chronic kidney disease: Secondary | ICD-10-CM | POA: Diagnosis not present

## 2022-11-21 DIAGNOSIS — J849 Interstitial pulmonary disease, unspecified: Secondary | ICD-10-CM | POA: Diagnosis not present

## 2022-11-21 DIAGNOSIS — I5032 Chronic diastolic (congestive) heart failure: Secondary | ICD-10-CM | POA: Diagnosis not present

## 2022-11-25 DIAGNOSIS — I5032 Chronic diastolic (congestive) heart failure: Secondary | ICD-10-CM | POA: Diagnosis not present

## 2022-11-25 DIAGNOSIS — N183 Chronic kidney disease, stage 3 unspecified: Secondary | ICD-10-CM | POA: Diagnosis not present

## 2022-11-25 DIAGNOSIS — D631 Anemia in chronic kidney disease: Secondary | ICD-10-CM | POA: Diagnosis not present

## 2022-11-25 DIAGNOSIS — J9611 Chronic respiratory failure with hypoxia: Secondary | ICD-10-CM | POA: Diagnosis not present

## 2022-11-25 DIAGNOSIS — I272 Pulmonary hypertension, unspecified: Secondary | ICD-10-CM | POA: Diagnosis not present

## 2022-11-25 DIAGNOSIS — J849 Interstitial pulmonary disease, unspecified: Secondary | ICD-10-CM | POA: Diagnosis not present

## 2022-11-25 DIAGNOSIS — M341 CR(E)ST syndrome: Secondary | ICD-10-CM | POA: Diagnosis not present

## 2022-11-25 DIAGNOSIS — Z5189 Encounter for other specified aftercare: Secondary | ICD-10-CM | POA: Diagnosis not present

## 2022-11-25 DIAGNOSIS — I130 Hypertensive heart and chronic kidney disease with heart failure and stage 1 through stage 4 chronic kidney disease, or unspecified chronic kidney disease: Secondary | ICD-10-CM | POA: Diagnosis not present

## 2022-11-25 DIAGNOSIS — S72142D Displaced intertrochanteric fracture of left femur, subsequent encounter for closed fracture with routine healing: Secondary | ICD-10-CM | POA: Diagnosis not present

## 2022-11-26 DIAGNOSIS — J9611 Chronic respiratory failure with hypoxia: Secondary | ICD-10-CM | POA: Diagnosis not present

## 2022-11-26 DIAGNOSIS — N183 Chronic kidney disease, stage 3 unspecified: Secondary | ICD-10-CM | POA: Diagnosis not present

## 2022-11-26 DIAGNOSIS — I5032 Chronic diastolic (congestive) heart failure: Secondary | ICD-10-CM | POA: Diagnosis not present

## 2022-11-26 DIAGNOSIS — I130 Hypertensive heart and chronic kidney disease with heart failure and stage 1 through stage 4 chronic kidney disease, or unspecified chronic kidney disease: Secondary | ICD-10-CM | POA: Diagnosis not present

## 2022-11-26 DIAGNOSIS — S72142D Displaced intertrochanteric fracture of left femur, subsequent encounter for closed fracture with routine healing: Secondary | ICD-10-CM | POA: Diagnosis not present

## 2022-11-26 DIAGNOSIS — D631 Anemia in chronic kidney disease: Secondary | ICD-10-CM | POA: Diagnosis not present

## 2022-11-26 DIAGNOSIS — I272 Pulmonary hypertension, unspecified: Secondary | ICD-10-CM | POA: Diagnosis not present

## 2022-11-26 DIAGNOSIS — M341 CR(E)ST syndrome: Secondary | ICD-10-CM | POA: Diagnosis not present

## 2022-11-26 DIAGNOSIS — J849 Interstitial pulmonary disease, unspecified: Secondary | ICD-10-CM | POA: Diagnosis not present

## 2022-11-27 DIAGNOSIS — M341 CR(E)ST syndrome: Secondary | ICD-10-CM | POA: Diagnosis not present

## 2022-11-27 DIAGNOSIS — J9611 Chronic respiratory failure with hypoxia: Secondary | ICD-10-CM | POA: Diagnosis not present

## 2022-11-27 DIAGNOSIS — I130 Hypertensive heart and chronic kidney disease with heart failure and stage 1 through stage 4 chronic kidney disease, or unspecified chronic kidney disease: Secondary | ICD-10-CM | POA: Diagnosis not present

## 2022-11-27 DIAGNOSIS — I5032 Chronic diastolic (congestive) heart failure: Secondary | ICD-10-CM | POA: Diagnosis not present

## 2022-11-27 DIAGNOSIS — I272 Pulmonary hypertension, unspecified: Secondary | ICD-10-CM | POA: Diagnosis not present

## 2022-11-27 DIAGNOSIS — N183 Chronic kidney disease, stage 3 unspecified: Secondary | ICD-10-CM | POA: Diagnosis not present

## 2022-11-27 DIAGNOSIS — D631 Anemia in chronic kidney disease: Secondary | ICD-10-CM | POA: Diagnosis not present

## 2022-11-27 DIAGNOSIS — J849 Interstitial pulmonary disease, unspecified: Secondary | ICD-10-CM | POA: Diagnosis not present

## 2022-11-27 DIAGNOSIS — S72142D Displaced intertrochanteric fracture of left femur, subsequent encounter for closed fracture with routine healing: Secondary | ICD-10-CM | POA: Diagnosis not present

## 2022-11-28 DIAGNOSIS — J9611 Chronic respiratory failure with hypoxia: Secondary | ICD-10-CM | POA: Diagnosis not present

## 2022-11-28 DIAGNOSIS — M341 CR(E)ST syndrome: Secondary | ICD-10-CM | POA: Diagnosis not present

## 2022-11-28 DIAGNOSIS — N183 Chronic kidney disease, stage 3 unspecified: Secondary | ICD-10-CM | POA: Diagnosis not present

## 2022-11-28 DIAGNOSIS — S72142D Displaced intertrochanteric fracture of left femur, subsequent encounter for closed fracture with routine healing: Secondary | ICD-10-CM | POA: Diagnosis not present

## 2022-11-28 DIAGNOSIS — J849 Interstitial pulmonary disease, unspecified: Secondary | ICD-10-CM | POA: Diagnosis not present

## 2022-11-28 DIAGNOSIS — I272 Pulmonary hypertension, unspecified: Secondary | ICD-10-CM | POA: Diagnosis not present

## 2022-11-28 DIAGNOSIS — I5032 Chronic diastolic (congestive) heart failure: Secondary | ICD-10-CM | POA: Diagnosis not present

## 2022-11-28 DIAGNOSIS — I130 Hypertensive heart and chronic kidney disease with heart failure and stage 1 through stage 4 chronic kidney disease, or unspecified chronic kidney disease: Secondary | ICD-10-CM | POA: Diagnosis not present

## 2022-11-28 DIAGNOSIS — D631 Anemia in chronic kidney disease: Secondary | ICD-10-CM | POA: Diagnosis not present

## 2022-12-02 ENCOUNTER — Ambulatory Visit
Admission: EM | Admit: 2022-12-02 | Discharge: 2022-12-02 | Disposition: A | Discharge status: Home / Self Care | Payer: Medicare PPO | Attending: Urgent Care | Admitting: Urgent Care

## 2022-12-02 ENCOUNTER — Ambulatory Visit (INDEPENDENT_AMBULATORY_CARE_PROVIDER_SITE_OTHER): Payer: Medicare PPO

## 2022-12-02 DIAGNOSIS — D631 Anemia in chronic kidney disease: Secondary | ICD-10-CM | POA: Diagnosis not present

## 2022-12-02 DIAGNOSIS — I272 Pulmonary hypertension, unspecified: Secondary | ICD-10-CM | POA: Diagnosis not present

## 2022-12-02 DIAGNOSIS — J9611 Chronic respiratory failure with hypoxia: Secondary | ICD-10-CM | POA: Diagnosis not present

## 2022-12-02 DIAGNOSIS — B9789 Other viral agents as the cause of diseases classified elsewhere: Secondary | ICD-10-CM

## 2022-12-02 DIAGNOSIS — J849 Interstitial pulmonary disease, unspecified: Secondary | ICD-10-CM | POA: Diagnosis not present

## 2022-12-02 DIAGNOSIS — I5032 Chronic diastolic (congestive) heart failure: Secondary | ICD-10-CM | POA: Diagnosis not present

## 2022-12-02 DIAGNOSIS — N183 Chronic kidney disease, stage 3 unspecified: Secondary | ICD-10-CM | POA: Diagnosis not present

## 2022-12-02 DIAGNOSIS — I130 Hypertensive heart and chronic kidney disease with heart failure and stage 1 through stage 4 chronic kidney disease, or unspecified chronic kidney disease: Secondary | ICD-10-CM | POA: Diagnosis not present

## 2022-12-02 DIAGNOSIS — R0602 Shortness of breath: Secondary | ICD-10-CM | POA: Diagnosis not present

## 2022-12-02 DIAGNOSIS — M341 CR(E)ST syndrome: Secondary | ICD-10-CM | POA: Diagnosis not present

## 2022-12-02 DIAGNOSIS — S72142D Displaced intertrochanteric fracture of left femur, subsequent encounter for closed fracture with routine healing: Secondary | ICD-10-CM | POA: Diagnosis not present

## 2022-12-02 DIAGNOSIS — I509 Heart failure, unspecified: Secondary | ICD-10-CM | POA: Diagnosis not present

## 2022-12-02 DIAGNOSIS — R059 Cough, unspecified: Secondary | ICD-10-CM | POA: Diagnosis not present

## 2022-12-02 DIAGNOSIS — J988 Other specified respiratory disorders: Secondary | ICD-10-CM

## 2022-12-02 MED ORDER — PREDNISONE 10 MG PO TABS: 30.0000 mg | ORAL_TABLET | Freq: Every day | ORAL | 0 refills | Status: DC

## 2022-12-02 NOTE — ED Provider Notes (Signed)
[facility]  Note:  This document was prepared using Systems analyst and may include unintentional dictation errors.  MRN: [id number] DOB: 09-24-1936  Subjective:   ANGLIQUE Ward is a 87 y.o. female presenting for 1-2 week history of persistent productive cough, shob with her coughing, spitting out a lot of mucus from coughing fits.  Patient denies fever, overt chest pain or wheezing, sinus congestion, sinus drainage, ear pain.  She does want me to check her ears as well however.  Has a history of chronic interstitial lung disease.  Patient is not a smoker.  No drug use.  No history of atrial fibrillation or abnormal heart rhythms.  She is typically done well with prednisone.  No current facility-administered medications for this encounter.  Current Outpatient Medications:    acetaminophen (TYLENOL) 325 MG tablet, Take 2 tablets (650 mg total) by mouth every 6 (six) hours as needed for moderate pain., Disp: 30 tablet, Rfl: 0   albuterol (VENTOLIN HFA) 108 (90 Base) MCG/ACT inhaler, INHALE 2 PUFFS INTO THE LUNGS EVERY 6 HOURS AS NEEDED FOR WHEEZING OR SHORTNESS OF BREATH, Disp: 6.7 g, Rfl: 3   citalopram (CELEXA) 10 MG tablet, Take 1 tablet (10 mg total) by mouth daily. (Patient taking differently: Take 10 mg by mouth daily as needed (depression).), Disp: 30 tablet, Rfl: 3   diltiazem (CARDIZEM CD) 120 MG 24 hr capsule, Take 1 capsule (120 mg total) by mouth in the morning., Disp: 90 capsule, Rfl: 3   diltiazem (CARDIZEM SR) 60 MG 12 hr capsule, Take 1 capsule (60 mg total) by mouth daily., Disp: 30 capsule, Rfl: 1   docusate sodium (COLACE) 100 MG capsule, Take 1 capsule (100 mg total) by mouth 2 (two) times daily., Disp: 10 capsule, Rfl: 0   fluticasone (FLONASE) 50 MCG/ACT nasal spray, Place 1 spray into both nostrils daily as needed for allergies. (Patient taking differently: Place 1 spray into both nostrils in the morning and at bedtime.), Disp: 48  g, Rfl: 0   furosemide (LASIX) 20 MG tablet, Take 1 tablet by mouth once daily, Disp: 90 tablet, Rfl: 0   guaiFENesin (MUCINEX) 600 MG 12 hr tablet, Take 300 mg by mouth daily., Disp: , Rfl:    HYDROcodone-acetaminophen (NORCO/VICODIN) 5-325 MG tablet, Take 1-2 tablets by mouth every 6 (six) hours as needed for moderate pain., Disp: 42 tablet, Rfl: 0   ipratropium (ATROVENT) 0.03 % nasal spray, Place 2 sprays into both nostrils every 12 (twelve) hours., Disp: 30 mL, Rfl: 12   latanoprost (XALATAN) 0.005 % ophthalmic solution, Place 1 drop into both eyes at bedtime., Disp: , Rfl:    magic mouthwash SOLN, Take 10 mLs by mouth 4 (four) times daily as needed for mouth pain., Disp: 15 mL, Rfl: 2   methocarbamol (ROBAXIN) 500 MG tablet, Take 1 tablet (500 mg total) by mouth every 6 (six) hours as needed for muscle spasms., Disp: 40 tablet, Rfl: 0   montelukast (SINGULAIR) 10 MG tablet, TAKE 1 TABLET BY MOUTH AT BEDTIME, Disp: 90 tablet, Rfl: 0   Multiple Vitamin (MULTIVITAMIN WITH MINERALS) TABS tablet, Take 1 tablet by mouth daily., Disp: , Rfl:    mupirocin ointment (BACTROBAN) 2 %, Apply 1 Application topically 2 (two) times daily as needed (dry skin)., Disp: , Rfl:    ondansetron (ZOFRAN) 4 MG tablet, Take 1 tablet (4 mg total) by mouth every 6 (six) hours as needed for nausea., Disp: 20 tablet, Rfl: 0  OPSUMIT 10 MG TABS, Take 10 mg by mouth daily., Disp: , Rfl:    OXYGEN, Inhale into the lungs. 2 LMP at night and upon exertion, Disp: , Rfl:    pantoprazole (PROTONIX) 40 MG tablet, Take 1 tablet (40 mg total) by mouth 2 (two) times daily before a meal., Disp: 60 tablet, Rfl: 5   polyethylene glycol (MIRALAX / GLYCOLAX) 17 g packet, Take 17 g by mouth daily., Disp: 14 each, Rfl: 0   predniSONE (DELTASONE) 10 MG tablet, TAKE 3 TABLETS BY MOUTH ONCE DAILY WITH BREAKFAST, Disp: , Rfl:    spironolactone (ALDACTONE) 25 MG tablet, Take 12.5 mg by mouth daily., Disp: , Rfl:    sucralfate (CARAFATE) 1  GM/10ML suspension, Take 10 mLs (1 g total) by mouth every 6 (six) hours as needed (reflux)., Disp: 420 mL, Rfl: 1   Allergies  Allergen Reactions   Codeine Nausea Only    Hallucinations, too   Other Nausea And Vomiting    "-mycin" antibiotics.   Also cause hallucinations.   Erythromycin Nausea And Vomiting   Iodinated Contrast Media Other (See Comments)    Renal issues   Lisinopril     Pt doesn't remember    Mycophenolate Mofetil Nausea Only   Sulfa Antibiotics Other (See Comments)    unknown    Past Medical History:  Diagnosis Date   Anemia    Angiodysplasia of stomach and duodenum    Arthus phenomenon    AVM (arteriovenous malformation)    Blood transfusion without reported diagnosis    last 4 units 12-22-15, Iron infusion x2 last -01-07-16,01-14-16.   Breast cancer ([place]) 1989   Left   Candida esophagitis (HCC)    Cataract    Chronic kidney disease    Chronic mild renal insuffiency   CREST syndrome (HCC)    GERD (gastroesophageal reflux disease)    w/ HH   Interstitial lung disease (HCC)    Nodule of kidney    Pericardial effusion    PONV (postoperative nausea and vomiting)    Pulmonary embolus ([place]) 2003   Pulmonary hypertension ([place])    followed by Dr Gaynell Face at [facility], now Dr. Maryjean Ka visit end 2'17.   Rectal incontinence    Renal cell carcinoma (HCC)    Scleroderma (HCC)    Tubular adenoma of colon    Uterine polyp      Past Surgical History:  Procedure Laterality Date   APPENDECTOMY  1962   BREAST LUMPECTOMY  1989   left   CATARACT EXTRACTION, BILATERAL Bilateral 12/2013   COLONOSCOPY WITH PROPOFOL N/A 04/15/2021   Procedure: COLONOSCOPY WITH PROPOFOL;  Surgeon: Milus Banister, MD;  Location: [facility];  Service: Endoscopy;  Laterality: N/A;   ENTEROSCOPY N/A 01/18/2016   Procedure: ENTEROSCOPY;  Surgeon: Mauri Pole, MD;  Location: [facility];  Service: Endoscopy;  Laterality: N/A;   ENTEROSCOPY N/A 05/24/2018   Procedure: ENTEROSCOPY;   Surgeon: Jackquline Denmark, MD;  Location: [facility];  Service: Endoscopy;  Laterality: N/A;   ENTEROSCOPY N/A 03/29/2021   Procedure: ENTEROSCOPY;  Surgeon: Jerene Bears, MD;  [facility]: [facility];  Service: Gastroenterology;  Laterality: N/A;   ENTEROSCOPY N/A 04/13/2021   Procedure: ENTEROSCOPY;  Surgeon: Yetta Flock, MD;  [facility]: [facility];  Service: Gastroenterology;  Laterality: N/A;   ESOPHAGOGASTRODUODENOSCOPY (EGD) WITH PROPOFOL N/A 12/21/2015   Procedure: ESOPHAGOGASTRODUODENOSCOPY (EGD) WITH PROPOFOL;  Surgeon: Irene Shipper, MD;  [facility];  Service: Endoscopy;  Laterality: N/A;   HOT  HEMOSTASIS N/A 05/24/2018   Procedure: HOT HEMOSTASIS (ARGON PLASMA COAGULATION/BICAP);  Surgeon: Jackquline Denmark, MD;  Location[facility];  Service: Endoscopy;  Laterality: N/A;   HOT HEMOSTASIS N/A 03/29/2021   Procedure: HOT HEMOSTASIS (ARGON PLASMA COAGULATION/BICAP);  Surgeon: Jerene Bears, MD;  Location: [facility];  Service: Gastroenterology;  Laterality: N/A;   HOT HEMOSTASIS N/A 04/13/2021   Procedure: HOT HEMOSTASIS (ARGON PLASMA COAGULATION/BICAP);  Surgeon: Yetta Flock, MD;  Location: [facility];  Service: Gastroenterology;  Laterality: N/A;   INTRAMEDULLARY (IM) NAIL INTERTROCHANTERIC Left 10/11/2022   Procedure: INTRAMEDULLARY (IM) NAIL INTERTROCHANTERIC;  Surgeon: Gaynelle Arabian, MD;  Location: [facility];  Service: Orthopedics;  Laterality: Left;   IR GENERIC HISTORICAL  06/05/2016   IR RADIOLOGIST EVAL & MGMT 06/05/2016 Aletta Edouard, MD [facility] INTERV RAD   IVC Filter     KIDNEY SURGERY     left -"laser surgery by Dr. Kathlene Cote- 5 yrs ago" no removal   RIGHT/LEFT HEART CATH AND CORONARY ANGIOGRAPHY N/A 04/25/2022   Procedure: RIGHT/LEFT HEART CATH AND CORONARY ANGIOGRAPHY;  Surgeon: Sherren Mocha, MD;  Location: [facility];  Service: Cardiovascular;  Laterality: N/A;   SCHLEROTHERAPY  05/24/2018   Procedure: Woodward Ku;  Surgeon: Jackquline Denmark,  MD;  [facility];  Service: Endoscopy;;   SUBMUCOSAL TATTOO INJECTION  04/13/2021   Procedure: SUBMUCOSAL TATTOO INJECTION;  Surgeon: Yetta Flock, MD;  [facility];  Service: Gastroenterology;;   TONSILLECTOMY     TUBAL LIGATION      Family History  Problem Relation Age of Onset   Bladder Cancer Father    Diabetes Father    Prostate cancer Father    Alzheimer's disease Mother    Diabetes Sister    Lung cancer Sister         smoker   Esophageal cancer Paternal Uncle    Colon cancer Neg Hx     Social History   Tobacco Use   Smoking status: Never   Smokeless tobacco: Never  Vaping Use   Vaping Use: Never used  Substance Use Topics   Alcohol use: No   Drug use: No    ROS   Objective:   Vitals: BP (!) 143/67 (BP Location: Right Arm)   Temp 97.9 F (36.6 C) (Oral)   Resp 20   SpO2 93%   Physical Exam Constitutional:      General: She is not in acute distress.    Appearance: Normal appearance. She is well-developed and normal weight. She is not ill-appearing, toxic-appearing or diaphoretic.  HENT:     Head: Normocephalic and atraumatic.     Right Ear: Tympanic membrane, ear canal and external ear normal. No drainage or tenderness. No middle ear effusion. There is no impacted cerumen. Tympanic membrane is not erythematous or bulging.     Left Ear: Tympanic membrane, ear canal and external ear normal. No drainage or tenderness.  No middle ear effusion. There is no impacted cerumen. Tympanic membrane is not erythematous or bulging.     Nose: Nose normal. No congestion or rhinorrhea.     Mouth/Throat:     Mouth: Mucous membranes are moist. No oral lesions.     Pharynx: No pharyngeal swelling, oropharyngeal exudate, posterior oropharyngeal erythema or uvula swelling.     Tonsils: No tonsillar exudate or tonsillar abscesses.  Eyes:     General: No scleral icterus.       Right eye: No discharge.        Left eye: No discharge.  Extraocular Movements: Extraocular movements intact.     Right eye: Normal extraocular motion.     Left eye: Normal extraocular motion.     Conjunctiva/sclera: Conjunctivae normal.  Cardiovascular:     Rate and Rhythm: Normal rate and regular rhythm.     Heart sounds: Normal heart sounds. No murmur heard.    No friction rub. No gallop.  Pulmonary:     Effort: Pulmonary effort is normal. No respiratory distress.     Breath sounds: No stridor. No wheezing, rhonchi or rales.  Chest:     Chest wall: No tenderness.  Musculoskeletal:     Cervical back: Normal range of motion and neck supple.  Lymphadenopathy:     Cervical: No cervical adenopathy.  Skin:    General: Skin is warm and dry.  Neurological:     General: No focal deficit present.     Mental Status: She is alert and oriented to person, place, and time.  Psychiatric:        Mood and Affect: Mood normal.        Behavior: Behavior normal.     DG Chest 2 View  Result Date: 12/02/2022 CLINICAL DATA:  Provided history: Productive cough. Shortness of breath. EXAM: CHEST - 2 VIEW COMPARISON:  Radiographs 10/10/2022 and earlier. Chest CT 04/23/2022. FINDINGS: Marked enlargement of the cardiopericardial silhouette. Aortic atherosclerosis. Known interstitial lung disease. No appreciable superimposed acute airspace consolidation. No evidence of pleural effusion or pneumothorax. Degenerative changes of the spine and bilateral acromioclavicular joints. Levocurvature of the mid to lower thoracic spine. IMPRESSION: 1. Chronic interstitial lung disease without appreciable superimposed acute airspace consolidation. 2. Marked enlargement of the cardiopericardial silhouette, likely reflecting a combination of cardiomegaly and a pericardial effusion. 3. Aortic Atherosclerosis (ICD10-I70.0). Electronically Signed   By: Kellie Simmering D.O.   On: 12/02/2022 13:30     Assessment and Plan :   PDMP not reviewed this encounter.  1. Viral respiratory  illness   2. Interstitial lung disease ([place])     Suspect patient is recovering from a viral respiratory illness likely worsened by her interstitial lung disease.  Recommended a oral prednisone course.  Use supportive care otherwise.  With a negative chest x-ray, will defer antibiotic use.  Counseled patient on potential for adverse effects with medications prescribed/recommended today, ER and return-to-clinic precautions discussed, patient verbalized understanding.    Jaynee Eagles, [place] 12/02/22 1342

## 2022-12-02 NOTE — ED Triage Notes (Addendum)
Pt c/o prod cough x 1-2 weeks-c/o SHOB x today-NAD-slow gait with own walker-reports some DOE

## 2022-12-02 NOTE — Discharge Instructions (Signed)
The x-ray did not show pneumonia and therefore do not need antibiotics. We will be using an oral prednisone course for 5 days. This will help your respiratory symptoms. You can use Delsym to help with your cough.

## 2022-12-03 ENCOUNTER — Ambulatory Visit: Payer: Medicare PPO | Admitting: Internal Medicine

## 2022-12-03 DIAGNOSIS — M341 CR(E)ST syndrome: Secondary | ICD-10-CM | POA: Diagnosis not present

## 2022-12-03 DIAGNOSIS — I272 Pulmonary hypertension, unspecified: Secondary | ICD-10-CM | POA: Diagnosis not present

## 2022-12-03 DIAGNOSIS — D631 Anemia in chronic kidney disease: Secondary | ICD-10-CM | POA: Diagnosis not present

## 2022-12-03 DIAGNOSIS — N183 Chronic kidney disease, stage 3 unspecified: Secondary | ICD-10-CM | POA: Diagnosis not present

## 2022-12-03 DIAGNOSIS — S72142D Displaced intertrochanteric fracture of left femur, subsequent encounter for closed fracture with routine healing: Secondary | ICD-10-CM | POA: Diagnosis not present

## 2022-12-03 DIAGNOSIS — J9611 Chronic respiratory failure with hypoxia: Secondary | ICD-10-CM | POA: Diagnosis not present

## 2022-12-03 DIAGNOSIS — J849 Interstitial pulmonary disease, unspecified: Secondary | ICD-10-CM | POA: Diagnosis not present

## 2022-12-03 DIAGNOSIS — I130 Hypertensive heart and chronic kidney disease with heart failure and stage 1 through stage 4 chronic kidney disease, or unspecified chronic kidney disease: Secondary | ICD-10-CM | POA: Diagnosis not present

## 2022-12-03 DIAGNOSIS — I5032 Chronic diastolic (congestive) heart failure: Secondary | ICD-10-CM | POA: Diagnosis not present

## 2022-12-04 DIAGNOSIS — D631 Anemia in chronic kidney disease: Secondary | ICD-10-CM | POA: Diagnosis not present

## 2022-12-04 DIAGNOSIS — S72142D Displaced intertrochanteric fracture of left femur, subsequent encounter for closed fracture with routine healing: Secondary | ICD-10-CM | POA: Diagnosis not present

## 2022-12-04 DIAGNOSIS — I272 Pulmonary hypertension, unspecified: Secondary | ICD-10-CM | POA: Diagnosis not present

## 2022-12-04 DIAGNOSIS — N183 Chronic kidney disease, stage 3 unspecified: Secondary | ICD-10-CM | POA: Diagnosis not present

## 2022-12-04 DIAGNOSIS — I5032 Chronic diastolic (congestive) heart failure: Secondary | ICD-10-CM | POA: Diagnosis not present

## 2022-12-04 DIAGNOSIS — M341 CR(E)ST syndrome: Secondary | ICD-10-CM | POA: Diagnosis not present

## 2022-12-04 DIAGNOSIS — I130 Hypertensive heart and chronic kidney disease with heart failure and stage 1 through stage 4 chronic kidney disease, or unspecified chronic kidney disease: Secondary | ICD-10-CM | POA: Diagnosis not present

## 2022-12-04 DIAGNOSIS — J849 Interstitial pulmonary disease, unspecified: Secondary | ICD-10-CM | POA: Diagnosis not present

## 2022-12-04 DIAGNOSIS — J9611 Chronic respiratory failure with hypoxia: Secondary | ICD-10-CM | POA: Diagnosis not present

## 2022-12-09 ENCOUNTER — Telehealth: Payer: Self-pay | Admitting: Internal Medicine

## 2022-12-09 DIAGNOSIS — J849 Interstitial pulmonary disease, unspecified: Secondary | ICD-10-CM | POA: Diagnosis not present

## 2022-12-09 DIAGNOSIS — S72142D Displaced intertrochanteric fracture of left femur, subsequent encounter for closed fracture with routine healing: Secondary | ICD-10-CM | POA: Diagnosis not present

## 2022-12-09 DIAGNOSIS — I272 Pulmonary hypertension, unspecified: Secondary | ICD-10-CM | POA: Diagnosis not present

## 2022-12-09 DIAGNOSIS — I130 Hypertensive heart and chronic kidney disease with heart failure and stage 1 through stage 4 chronic kidney disease, or unspecified chronic kidney disease: Secondary | ICD-10-CM | POA: Diagnosis not present

## 2022-12-09 DIAGNOSIS — I5032 Chronic diastolic (congestive) heart failure: Secondary | ICD-10-CM | POA: Diagnosis not present

## 2022-12-09 DIAGNOSIS — N183 Chronic kidney disease, stage 3 unspecified: Secondary | ICD-10-CM | POA: Diagnosis not present

## 2022-12-09 DIAGNOSIS — M341 CR(E)ST syndrome: Secondary | ICD-10-CM | POA: Diagnosis not present

## 2022-12-09 DIAGNOSIS — D631 Anemia in chronic kidney disease: Secondary | ICD-10-CM | POA: Diagnosis not present

## 2022-12-09 DIAGNOSIS — J9611 Chronic respiratory failure with hypoxia: Secondary | ICD-10-CM | POA: Diagnosis not present

## 2022-12-09 NOTE — Telephone Encounter (Signed)
Placed inside crawford's office box

## 2022-12-09 NOTE — Telephone Encounter (Signed)
Patient has dropped off form with information to complete a cover letter that is needed, patient has an appointment scheduled for Thursday 12/18/22 to discuss . Form will be in the providers box up front.

## 2022-12-10 DIAGNOSIS — I5032 Chronic diastolic (congestive) heart failure: Secondary | ICD-10-CM | POA: Diagnosis not present

## 2022-12-10 DIAGNOSIS — I272 Pulmonary hypertension, unspecified: Secondary | ICD-10-CM | POA: Diagnosis not present

## 2022-12-10 DIAGNOSIS — N183 Chronic kidney disease, stage 3 unspecified: Secondary | ICD-10-CM | POA: Diagnosis not present

## 2022-12-10 DIAGNOSIS — I130 Hypertensive heart and chronic kidney disease with heart failure and stage 1 through stage 4 chronic kidney disease, or unspecified chronic kidney disease: Secondary | ICD-10-CM | POA: Diagnosis not present

## 2022-12-10 DIAGNOSIS — S72142D Displaced intertrochanteric fracture of left femur, subsequent encounter for closed fracture with routine healing: Secondary | ICD-10-CM | POA: Diagnosis not present

## 2022-12-10 DIAGNOSIS — J9611 Chronic respiratory failure with hypoxia: Secondary | ICD-10-CM | POA: Diagnosis not present

## 2022-12-10 DIAGNOSIS — D631 Anemia in chronic kidney disease: Secondary | ICD-10-CM | POA: Diagnosis not present

## 2022-12-10 DIAGNOSIS — M341 CR(E)ST syndrome: Secondary | ICD-10-CM | POA: Diagnosis not present

## 2022-12-10 DIAGNOSIS — J849 Interstitial pulmonary disease, unspecified: Secondary | ICD-10-CM | POA: Diagnosis not present

## 2022-12-13 ENCOUNTER — Encounter: Payer: Self-pay | Admitting: Cardiology

## 2022-12-13 ENCOUNTER — Encounter: Payer: Self-pay | Admitting: Cardiovascular Disease

## 2022-12-13 DIAGNOSIS — I3139 Other pericardial effusion (noninflammatory): Secondary | ICD-10-CM

## 2022-12-15 NOTE — Telephone Encounter (Signed)
I do not have all required information in this phone note to complete letter. I do need her NCDL # and then letter can be finished. It is pended. Visit is not required for this okay to cancel.

## 2022-12-15 NOTE — Telephone Encounter (Signed)
Patients DL [id number], states she would also like a call when it is complete so she can come pick up a copy of the paperwork.

## 2022-12-15 NOTE — Telephone Encounter (Signed)
Letter done and signed. Available on mychart as well.

## 2022-12-15 NOTE — Telephone Encounter (Signed)
Responded to other MyChart message, forwarded to Dr. Burt Knack to review.

## 2022-12-15 NOTE — Telephone Encounter (Signed)
[place] number, Patients cell and Patients husband but there was no answer and unable to leave voicemail. We just need patients NCDL# for Dr Nathanial Millman to finish patient form

## 2022-12-15 NOTE — Telephone Encounter (Signed)
Faxed and patient will be here to pick up her forms as well

## 2022-12-16 ENCOUNTER — Encounter: Payer: Self-pay | Admitting: Cardiology

## 2022-12-16 DIAGNOSIS — I272 Pulmonary hypertension, unspecified: Secondary | ICD-10-CM | POA: Diagnosis not present

## 2022-12-16 DIAGNOSIS — S72142D Displaced intertrochanteric fracture of left femur, subsequent encounter for closed fracture with routine healing: Secondary | ICD-10-CM | POA: Diagnosis not present

## 2022-12-16 DIAGNOSIS — I5032 Chronic diastolic (congestive) heart failure: Secondary | ICD-10-CM | POA: Diagnosis not present

## 2022-12-16 DIAGNOSIS — J9611 Chronic respiratory failure with hypoxia: Secondary | ICD-10-CM | POA: Diagnosis not present

## 2022-12-16 DIAGNOSIS — D631 Anemia in chronic kidney disease: Secondary | ICD-10-CM | POA: Diagnosis not present

## 2022-12-16 DIAGNOSIS — N183 Chronic kidney disease, stage 3 unspecified: Secondary | ICD-10-CM | POA: Diagnosis not present

## 2022-12-16 DIAGNOSIS — J849 Interstitial pulmonary disease, unspecified: Secondary | ICD-10-CM | POA: Diagnosis not present

## 2022-12-16 DIAGNOSIS — M341 CR(E)ST syndrome: Secondary | ICD-10-CM | POA: Diagnosis not present

## 2022-12-16 DIAGNOSIS — I130 Hypertensive heart and chronic kidney disease with heart failure and stage 1 through stage 4 chronic kidney disease, or unspecified chronic kidney disease: Secondary | ICD-10-CM | POA: Diagnosis not present

## 2022-12-18 ENCOUNTER — Other Ambulatory Visit (INDEPENDENT_AMBULATORY_CARE_PROVIDER_SITE_OTHER): Payer: Medicare PPO

## 2022-12-18 ENCOUNTER — Ambulatory Visit: Payer: Medicare PPO | Admitting: Internal Medicine

## 2022-12-18 DIAGNOSIS — I272 Pulmonary hypertension, unspecified: Secondary | ICD-10-CM | POA: Diagnosis not present

## 2022-12-18 LAB — COMPREHENSIVE METABOLIC PANEL
ALT: 9 U/L (ref 0–35)
AST: 21 U/L (ref 0–37)
Albumin: 3.9 g/dL (ref 3.5–5.2)
Alkaline Phosphatase: 79 U/L (ref 39–117)
BUN: 39 mg/dL — ABNORMAL HIGH (ref 6–23)
CO2: 31 mEq/L (ref 19–32)
Calcium: 9.4 mg/dL (ref 8.4–10.5)
Chloride: 99 mEq/L (ref 96–112)
Creatinine, Ser: 1.18 mg/dL (ref 0.40–1.20)
GFR: 42.47 mL/min — ABNORMAL LOW (ref >60.00)
Glucose, Bld: 89 mg/dL (ref 70–99)
Potassium: 4 mEq/L (ref 3.5–5.1)
Sodium: 139 mEq/L (ref 135–145)
Total Bilirubin: 0.5 mg/dL (ref 0.2–1.2)
Total Protein: 6.6 g/dL (ref 6.0–8.3)

## 2022-12-18 LAB — CBC
HCT: 28.9 % — ABNORMAL LOW (ref 36.0–46.0)
Hemoglobin: 9.8 g/dL — ABNORMAL LOW (ref 12.0–15.0)
MCHC: 34 g/dL (ref 30.0–36.0)
MCV: 100.3 fl — ABNORMAL HIGH (ref 78.0–100.0)
Platelets: 198 10*3/uL (ref 150.0–400.0)
RBC: 2.88 Mil/uL — ABNORMAL LOW (ref 3.87–5.11)
RDW: 20.3 % — ABNORMAL HIGH (ref 11.5–15.5)
WBC: 7.6 10*3/uL (ref 4.0–10.5)

## 2022-12-23 DIAGNOSIS — D631 Anemia in chronic kidney disease: Secondary | ICD-10-CM | POA: Diagnosis not present

## 2022-12-23 DIAGNOSIS — N183 Chronic kidney disease, stage 3 unspecified: Secondary | ICD-10-CM | POA: Diagnosis not present

## 2022-12-23 DIAGNOSIS — I5032 Chronic diastolic (congestive) heart failure: Secondary | ICD-10-CM | POA: Diagnosis not present

## 2022-12-23 DIAGNOSIS — S72142D Displaced intertrochanteric fracture of left femur, subsequent encounter for closed fracture with routine healing: Secondary | ICD-10-CM | POA: Diagnosis not present

## 2022-12-23 DIAGNOSIS — J9611 Chronic respiratory failure with hypoxia: Secondary | ICD-10-CM | POA: Diagnosis not present

## 2022-12-23 DIAGNOSIS — J849 Interstitial pulmonary disease, unspecified: Secondary | ICD-10-CM | POA: Diagnosis not present

## 2022-12-23 DIAGNOSIS — I272 Pulmonary hypertension, unspecified: Secondary | ICD-10-CM | POA: Diagnosis not present

## 2022-12-23 DIAGNOSIS — M341 CR(E)ST syndrome: Secondary | ICD-10-CM | POA: Diagnosis not present

## 2022-12-23 DIAGNOSIS — I130 Hypertensive heart and chronic kidney disease with heart failure and stage 1 through stage 4 chronic kidney disease, or unspecified chronic kidney disease: Secondary | ICD-10-CM | POA: Diagnosis not present

## 2022-12-30 ENCOUNTER — Ambulatory Visit (HOSPITAL_COMMUNITY)
Admission: RE | Admit: 2022-12-30 | Discharge: 2022-12-30 | Disposition: A | Discharge status: Home / Self Care | Payer: Medicare PPO | Source: Ambulatory Visit | Attending: Nephrology | Admitting: Nephrology

## 2022-12-30 VITALS — BP 111/48 | HR 80 | Temp 97.5°F

## 2022-12-30 DIAGNOSIS — M341 CR(E)ST syndrome: Secondary | ICD-10-CM | POA: Diagnosis not present

## 2022-12-30 DIAGNOSIS — D638 Anemia in other chronic diseases classified elsewhere: Secondary | ICD-10-CM

## 2022-12-30 DIAGNOSIS — J849 Interstitial pulmonary disease, unspecified: Secondary | ICD-10-CM | POA: Diagnosis not present

## 2022-12-30 DIAGNOSIS — I5032 Chronic diastolic (congestive) heart failure: Secondary | ICD-10-CM | POA: Diagnosis not present

## 2022-12-30 DIAGNOSIS — J9611 Chronic respiratory failure with hypoxia: Secondary | ICD-10-CM | POA: Diagnosis not present

## 2022-12-30 DIAGNOSIS — N1832 Chronic kidney disease, stage 3b: Secondary | ICD-10-CM | POA: Diagnosis not present

## 2022-12-30 DIAGNOSIS — S72142D Displaced intertrochanteric fracture of left femur, subsequent encounter for closed fracture with routine healing: Secondary | ICD-10-CM | POA: Diagnosis not present

## 2022-12-30 DIAGNOSIS — I272 Pulmonary hypertension, unspecified: Secondary | ICD-10-CM | POA: Diagnosis not present

## 2022-12-30 DIAGNOSIS — I130 Hypertensive heart and chronic kidney disease with heart failure and stage 1 through stage 4 chronic kidney disease, or unspecified chronic kidney disease: Secondary | ICD-10-CM | POA: Diagnosis not present

## 2022-12-30 DIAGNOSIS — D631 Anemia in chronic kidney disease: Secondary | ICD-10-CM | POA: Diagnosis not present

## 2022-12-30 DIAGNOSIS — N183 Chronic kidney disease, stage 3 unspecified: Secondary | ICD-10-CM | POA: Diagnosis not present

## 2022-12-30 LAB — IRON AND TIBC
Iron: 82 ug/dL (ref 28–170)
Saturation Ratios: 40 % — ABNORMAL HIGH (ref 10.4–31.8)
TIBC: 207 ug/dL — ABNORMAL LOW (ref 250–450)
UIBC: 125 ug/dL

## 2022-12-30 LAB — FERRITIN: Ferritin: 1414 ng/mL — ABNORMAL HIGH (ref 11–307)

## 2022-12-30 MED ORDER — EPOETIN ALFA-EPBX 10000 UNIT/ML IJ SOLN
INTRAMUSCULAR | Status: AC
  Filled 2022-12-30: qty 1

## 2022-12-30 MED ORDER — EPOETIN ALFA-EPBX 10000 UNIT/ML IJ SOLN
10000.0000 [IU] | INTRAMUSCULAR | Status: DC
  Administered 2022-12-30: 10000 [IU] via SUBCUTANEOUS

## 2022-12-31 DIAGNOSIS — Z5189 Encounter for other specified aftercare: Secondary | ICD-10-CM | POA: Diagnosis not present

## 2022-12-31 DIAGNOSIS — I509 Heart failure, unspecified: Secondary | ICD-10-CM | POA: Diagnosis not present

## 2023-01-06 DIAGNOSIS — I739 Peripheral vascular disease, unspecified: Secondary | ICD-10-CM | POA: Diagnosis not present

## 2023-01-06 DIAGNOSIS — L603 Nail dystrophy: Secondary | ICD-10-CM | POA: Diagnosis not present

## 2023-01-06 DIAGNOSIS — L84 Corns and callosities: Secondary | ICD-10-CM | POA: Diagnosis not present

## 2023-01-08 DIAGNOSIS — D631 Anemia in chronic kidney disease: Secondary | ICD-10-CM | POA: Diagnosis not present

## 2023-01-08 DIAGNOSIS — S72142D Displaced intertrochanteric fracture of left femur, subsequent encounter for closed fracture with routine healing: Secondary | ICD-10-CM | POA: Diagnosis not present

## 2023-01-08 DIAGNOSIS — I272 Pulmonary hypertension, unspecified: Secondary | ICD-10-CM | POA: Diagnosis not present

## 2023-01-08 DIAGNOSIS — J849 Interstitial pulmonary disease, unspecified: Secondary | ICD-10-CM | POA: Diagnosis not present

## 2023-01-08 DIAGNOSIS — N183 Chronic kidney disease, stage 3 unspecified: Secondary | ICD-10-CM | POA: Diagnosis not present

## 2023-01-08 DIAGNOSIS — I5032 Chronic diastolic (congestive) heart failure: Secondary | ICD-10-CM | POA: Diagnosis not present

## 2023-01-08 DIAGNOSIS — J9611 Chronic respiratory failure with hypoxia: Secondary | ICD-10-CM | POA: Diagnosis not present

## 2023-01-08 DIAGNOSIS — M341 CR(E)ST syndrome: Secondary | ICD-10-CM | POA: Diagnosis not present

## 2023-01-08 DIAGNOSIS — I130 Hypertensive heart and chronic kidney disease with heart failure and stage 1 through stage 4 chronic kidney disease, or unspecified chronic kidney disease: Secondary | ICD-10-CM | POA: Diagnosis not present

## 2023-01-15 ENCOUNTER — Ambulatory Visit (HOSPITAL_COMMUNITY): Payer: Medicare PPO | Attending: Cardiology

## 2023-01-15 DIAGNOSIS — I3139 Other pericardial effusion (noninflammatory): Secondary | ICD-10-CM | POA: Diagnosis not present

## 2023-01-15 DIAGNOSIS — N1831 Chronic kidney disease, stage 3a: Secondary | ICD-10-CM | POA: Diagnosis not present

## 2023-01-15 LAB — ECHOCARDIOGRAM COMPLETE
AR max vel: 1.37 cm2
AV Area VTI: 1.39 cm2
AV Area mean vel: 1.45 cm2
AV Mean grad: 34 mmHg
AV Peak grad: 72.5 mmHg
Ao pk vel: 4.26 m/s
Area-P 1/2: 2.8 cm2
Calc EF: 62.5 %
MV VTI: 2.54 cm2
S' Lateral: 2.1 cm
Single Plane A2C EF: 59.7 %
Single Plane A4C EF: 67 %

## 2023-01-16 ENCOUNTER — Other Ambulatory Visit: Payer: Self-pay

## 2023-01-16 DIAGNOSIS — I3139 Other pericardial effusion (noninflammatory): Secondary | ICD-10-CM

## 2023-01-16 DIAGNOSIS — I350 Nonrheumatic aortic (valve) stenosis: Secondary | ICD-10-CM

## 2023-01-19 DIAGNOSIS — N1831 Chronic kidney disease, stage 3a: Secondary | ICD-10-CM | POA: Diagnosis not present

## 2023-01-19 DIAGNOSIS — D638 Anemia in other chronic diseases classified elsewhere: Secondary | ICD-10-CM | POA: Diagnosis not present

## 2023-01-19 DIAGNOSIS — R809 Proteinuria, unspecified: Secondary | ICD-10-CM | POA: Diagnosis not present

## 2023-01-19 DIAGNOSIS — M349 Systemic sclerosis, unspecified: Secondary | ICD-10-CM | POA: Diagnosis not present

## 2023-01-19 DIAGNOSIS — I272 Pulmonary hypertension, unspecified: Secondary | ICD-10-CM | POA: Diagnosis not present

## 2023-01-19 DIAGNOSIS — I129 Hypertensive chronic kidney disease with stage 1 through stage 4 chronic kidney disease, or unspecified chronic kidney disease: Secondary | ICD-10-CM | POA: Diagnosis not present

## 2023-01-19 DIAGNOSIS — Z85528 Personal history of other malignant neoplasm of kidney: Secondary | ICD-10-CM | POA: Diagnosis not present

## 2023-01-26 DIAGNOSIS — Z1231 Encounter for screening mammogram for malignant neoplasm of breast: Secondary | ICD-10-CM | POA: Diagnosis not present

## 2023-01-26 LAB — HM MAMMOGRAPHY

## 2023-01-27 ENCOUNTER — Other Ambulatory Visit: Payer: Self-pay | Admitting: Internal Medicine

## 2023-01-27 ENCOUNTER — Encounter (HOSPITAL_COMMUNITY): Payer: Medicare PPO

## 2023-01-28 ENCOUNTER — Telehealth: Payer: Self-pay | Admitting: Internal Medicine

## 2023-01-28 NOTE — Telephone Encounter (Signed)
Prescription Request  01/28/2023  LOV: 11/11/2022  What is the name of the medication or equipment? Spironolactone -   Have you contacted your pharmacy to request a refill? Yes   Which pharmacy would you like this sent to?  [facility] [address] [place], [place] - [address] [place] [address] Phone: [phone] Fax: [phone]  Patient notified that their request is being sent to the clinical staff for review and that they should receive a response within 2 business days.   Please advise at Mobile [phone] (mobile)

## 2023-01-29 ENCOUNTER — Telehealth: Payer: Self-pay

## 2023-01-29 ENCOUNTER — Encounter: Payer: Self-pay | Admitting: Internal Medicine

## 2023-01-29 NOTE — Telephone Encounter (Signed)
Please verify dose with patient. Okay to fill for 90 days.

## 2023-01-29 NOTE — Telephone Encounter (Signed)
Ok to refill 

## 2023-01-29 NOTE — Telephone Encounter (Signed)
Called the pt and was unable to leave a voicemail

## 2023-01-30 MED ORDER — SPIRONOLACTONE 25 MG PO TABS: 12.5000 mg | ORAL_TABLET | Freq: Every day | ORAL | 3 refills | Status: DC

## 2023-01-30 NOTE — Addendum Note (Signed)
Addended by: Hillard Danker A on: 01/30/2023 03:51 PM   Modules accepted: Orders

## 2023-01-30 NOTE — Telephone Encounter (Signed)
Patient called back - dose is:  25 mg - takes 1/2 a pill a day

## 2023-01-30 NOTE — Telephone Encounter (Signed)
Sent in

## 2023-01-31 DIAGNOSIS — I509 Heart failure, unspecified: Secondary | ICD-10-CM | POA: Diagnosis not present

## 2023-02-06 NOTE — Progress Notes (Signed)
Paula Ward    [id number]    07/06/1936  Primary Care Physician:Crawford, Austin Miles, MD  Referring Physician: [name], MD [address] [place],  [place] [address]   Chief complaint:  Iron deficincy anemia Chief Complaint  Patient presents with   Gastroesophageal Reflux    Has to get up and cough up mucus. Having manageable bowels.   HPI: 87 year old very pleasant female with history of scleroderma, pulmonary hypertension, large pericardial effusion, small bowel AVM with chronic anemia here for follow-up visit  She is followed by [facility] for pulmonary hypertension and ILD.  Her oxygen saturation was 93% on 6 minute walk test, has home O2. She was previously on Bactrim, CellCept and prednisone.  She is currently on macitentan and diltiazem. She has large pericardial effusion without tamponade physiology, is monitored by cardiology at [facility]  I last saw her on 07-22-22. At that time, she was doing better overall. Her hemoglobin had improved to 12 with EPO injection IV iron infusion.her weight had stabilized and was using Miralax every other day and had regular BM. She had accessional intermittent episodes of fecal incontinence when she walks or she finds stool seepage in her depends.   In the interim she was hospitalized in December 2023 after fall with left femur fracture, she underwent intramedullary nailing  Today, she states her mouth is consistently irritated and would swell up and states that the magic mouthwash tends to help with it. She also reports taking Carafate once daily at night. She is typically having a BM once daily with stool softener. She reports having good appetite and is eating well and states her weight has been stable overall. She also complains of a cough and an issue with her voice. Her cough is accompanied by a light string of mucus. She states that she's been taking her Protonix 40 mg regularly.   She states she stopped  getting erythropoietin injections and states she's seeing her hematologist tomorrow.    GI Hx:  Small bowel enteroscopy on June 10 and April 13, 2021: Multiple AVMs in the stomach and small bowel treated with APC   Colonoscopy April 15, 2021: Left colon diverticulosis otherwise unremarkable exam  CT abdomen and pelvis 04/04/2019: Large amount of stool through out the colon, colon diverticulosis otherwise no acute pathology Large pericardial effusion, limited imaging.   CT chest UIP pattern fibrosis   Colonoscopy July 12, 2014 by Dr. Juanda Chance: Sessile polyp tubular adenoma removed from ascending colon.  Diverticulosis   Current Outpatient Medications:    acetaminophen (TYLENOL) 325 MG tablet, Take 2 tablets (650 mg total) by mouth every 6 (six) hours as needed for moderate pain., Disp: 30 tablet, Rfl: 0   albuterol (VENTOLIN HFA) 108 (90 Base) MCG/ACT inhaler, INHALE 2 PUFFS INTO THE LUNGS EVERY 6 HOURS AS NEEDED FOR WHEEZING OR SHORTNESS OF BREATH, Disp: 6.7 g, Rfl: 3   citalopram (CELEXA) 10 MG tablet, Take 1 tablet (10 mg total) by mouth daily. (Patient taking differently: Take 10 mg by mouth daily as needed (depression).), Disp: 30 tablet, Rfl: 3   diltiazem (CARDIZEM CD) 120 MG 24 hr capsule, Take 1 capsule (120 mg total) by mouth in the morning., Disp: 90 capsule, Rfl: 3   docusate sodium (COLACE) 100 MG capsule, Take 1 capsule (100 mg total) by mouth 2 (two) times daily., Disp: 10 capsule, Rfl: 0   fluticasone (FLONASE) 50 MCG/ACT nasal spray, Place 1 spray  into both nostrils daily as needed for allergies. (Patient taking differently: Place 1 spray into both nostrils in the morning and at bedtime.), Disp: 48 g, Rfl: 0   furosemide (LASIX) 20 MG tablet, Take 1 tablet by mouth once daily, Disp: 90 tablet, Rfl: 0   guaiFENesin (MUCINEX) 600 MG 12 hr tablet, Take 300 mg by mouth daily., Disp: , Rfl:    ipratropium (ATROVENT) 0.03 % nasal spray, Place 2 sprays into both nostrils every 12  (twelve) hours., Disp: 30 mL, Rfl: 12   latanoprost (XALATAN) 0.005 % ophthalmic solution, Place 1 drop into both eyes at bedtime., Disp: , Rfl:    magic mouthwash SOLN, Take 10 mLs by mouth 4 (four) times daily as needed for mouth pain., Disp: 15 mL, Rfl: 2   montelukast (SINGULAIR) 10 MG tablet, TAKE 1 TABLET BY MOUTH AT BEDTIME, Disp: 90 tablet, Rfl: 0   Multiple Vitamin (MULTIVITAMIN WITH MINERALS) TABS tablet, Take 1 tablet by mouth daily., Disp: , Rfl:    mupirocin ointment (BACTROBAN) 2 %, Apply 1 Application topically 2 (two) times daily as needed (dry skin)., Disp: , Rfl:    ondansetron (ZOFRAN) 4 MG tablet, Take 1 tablet (4 mg total) by mouth every 6 (six) hours as needed for nausea., Disp: 20 tablet, Rfl: 0   OPSUMIT 10 MG TABS, Take 10 mg by mouth daily., Disp: , Rfl:    OXYGEN, Inhale into the lungs. 2 LMP at night and upon exertion, Disp: , Rfl:    pantoprazole (PROTONIX) 40 MG tablet, Take 1 tablet (40 mg total) by mouth 2 (two) times daily before a meal., Disp: 60 tablet, Rfl: 5   polyethylene glycol (MIRALAX / GLYCOLAX) 17 g packet, Take 17 g by mouth daily., Disp: 14 each, Rfl: 0   spironolactone (ALDACTONE) 25 MG tablet, Take 0.5 tablets (12.5 mg total) by mouth daily., Disp: 45 tablet, Rfl: 3   sucralfate (CARAFATE) 1 GM/10ML suspension, Take 10 mLs (1 g total) by mouth every 6 (six) hours as needed (reflux)., Disp: 420 mL, Rfl: 1   diltiazem (CARDIZEM SR) 60 MG 12 hr capsule, Take 1 capsule (60 mg total) by mouth daily. (Patient not taking: Reported on 02/10/2023), Disp: 30 capsule, Rfl: 1   HYDROcodone-acetaminophen (NORCO/VICODIN) 5-325 MG tablet, Take 1-2 tablets by mouth every 6 (six) hours as needed for moderate pain. (Patient not taking: Reported on 02/10/2023), Disp: 42 tablet, Rfl: 0   methocarbamol (ROBAXIN) 500 MG tablet, Take 1 tablet (500 mg total) by mouth every 6 (six) hours as needed for muscle spasms. (Patient not taking: Reported on 02/10/2023), Disp: 40 tablet, Rfl:  0   predniSONE (DELTASONE) 10 MG tablet, Take 3 tablets (30 mg total) by mouth daily with breakfast. (Patient not taking: Reported on 02/10/2023), Disp: 15 tablet, Rfl: 0    Allergies as of 02/10/2023 - Review Complete 02/10/2023  Allergen Reaction Noted   Codeine Nausea Only 08/03/2008   Other Nausea And Vomiting 04/05/2012   Erythromycin Nausea And Vomiting 10/17/2014   Iodinated contrast media Other (See Comments) 04/18/2022   Lisinopril  12/14/2014   Mycophenolate mofetil Nausea Only 09/23/2019   Sulfa antibiotics Other (See Comments) 03/28/2021    Past Medical History:  Diagnosis Date   Anemia    Angiodysplasia of stomach and duodenum    Arthus phenomenon    AVM (arteriovenous malformation)    Blood transfusion without reported diagnosis    last 4 units 12-22-15, Iron infusion x2 last -01-07-16,01-14-16.   Breast cancer 1989  Left   Candida esophagitis    Cataract    Chronic kidney disease    Chronic mild renal insuffiency   CREST syndrome    GERD (gastroesophageal reflux disease)    w/ HH   Interstitial lung disease    Nodule of kidney    Pericardial effusion    PONV (postoperative nausea and vomiting)    Pulmonary embolus 2003   Pulmonary hypertension    followed by Dr Bland Span at [facility], now Dr. Penni Bombard visit end 2'17.   Rectal incontinence    Renal cell carcinoma    Scleroderma    Tubular adenoma of colon    Uterine polyp     Past Surgical History:  Procedure Laterality Date   APPENDECTOMY  1962   BREAST LUMPECTOMY  1989   left   CATARACT EXTRACTION, BILATERAL Bilateral 12/2013   COLONOSCOPY WITH PROPOFOL N/A 04/15/2021   Procedure: COLONOSCOPY WITH PROPOFOL;  Surgeon: Rachael Fee, MD;  Location: [facility];  Service: Endoscopy;  Laterality: N/A;   ENTEROSCOPY N/A 01/18/2016   Procedure: ENTEROSCOPY;  Surgeon: Napoleon Form, MD;  Location: [facility];  Service: Endoscopy;  Laterality: N/A;   ENTEROSCOPY N/A 05/24/2018   Procedure:  ENTEROSCOPY;  Surgeon: Lynann Bologna, MD;  Location: [facility];  Service: Endoscopy;  Laterality: N/A;   ENTEROSCOPY N/A 03/29/2021   Procedure: ENTEROSCOPY;  Surgeon: Beverley Fiedler, MD;  Location: [facility];  Service: Gastroenterology;  Laterality: N/A;   ENTEROSCOPY N/A 04/13/2021   Procedure: ENTEROSCOPY;  Surgeon: Benancio Deeds, MD;  Location: [facility];  Service: Gastroenterology;  Laterality: N/A;   ESOPHAGOGASTRODUODENOSCOPY (EGD) WITH PROPOFOL N/A 12/21/2015   Procedure: ESOPHAGOGASTRODUODENOSCOPY (EGD) WITH PROPOFOL;  Surgeon: Hilarie Fredrickson, MD;  Location: [facility];  Service: Endoscopy;  Laterality: N/A;   HOT HEMOSTASIS N/A 05/24/2018   Procedure: HOT HEMOSTASIS (ARGON PLASMA COAGULATION/BICAP);  Surgeon: Lynann Bologna, MD;  Location: [facility];  Service: Endoscopy;  Laterality: N/A;   HOT HEMOSTASIS N/A 03/29/2021   Procedure: HOT HEMOSTASIS (ARGON PLASMA COAGULATION/BICAP);  Surgeon: Beverley Fiedler, MD;  Location: [facility];  Service: Gastroenterology;  Laterality: N/A;   HOT HEMOSTASIS N/A 04/13/2021   Procedure: HOT HEMOSTASIS (ARGON PLASMA COAGULATION/BICAP);  Surgeon: Benancio Deeds, MD;  Location: [facility];  Service: Gastroenterology;  Laterality: N/A;   INTRAMEDULLARY (IM) NAIL INTERTROCHANTERIC Left 10/11/2022   Procedure: INTRAMEDULLARY (IM) NAIL INTERTROCHANTERIC;  Surgeon: Ollen Gross, MD;  Location: [facility];  Service: Orthopedics;  Laterality: Left;   IR GENERIC HISTORICAL  06/05/2016   IR RADIOLOGIST EVAL & MGMT 06/05/2016 [name], MD [facility] INTERV RAD   IVC Filter     KIDNEY SURGERY     left -"laser surgery by Dr. Fredia Sorrow- 5 yrs ago" no removal   RIGHT/LEFT HEART CATH AND CORONARY ANGIOGRAPHY N/A 04/25/2022   Procedure: RIGHT/LEFT HEART CATH AND CORONARY ANGIOGRAPHY;  Surgeon: Tonny Bollman, MD;  Location: [facility] INVASIVE CV LAB;  Service: Cardiovascular;  Laterality: N/A;   SCHLEROTHERAPY  05/24/2018   Procedure: Theresia Majors;  Surgeon:  Lynann Bologna, MD;  Location: [facility];  Service: Endoscopy;;   SUBMUCOSAL TATTOO INJECTION  04/13/2021   Procedure: SUBMUCOSAL TATTOO INJECTION;  Surgeon: Benancio Deeds, MD;  Location: [facility];  Service: Gastroenterology;;   TONSILLECTOMY     TUBAL LIGATION      Family History  Problem Relation Age of Onset   Bladder Cancer Father    Diabetes Father    Prostate cancer Father    Alzheimer's disease Mother  Diabetes Sister    Lung cancer Sister         smoker   Esophageal cancer Paternal Uncle    Colon cancer Neg Hx     Social History   Socioeconomic History   Marital status: Married    Spouse name: Not on file   Number of children: 2   Years of education: Not on file   Highest education level: Not on file  Occupational History   Occupation: Retired  Tobacco Use   Smoking status: Never   Smokeless tobacco: Never  Vaping Use   Vaping Use: Never used  Substance and Sexual Activity   Alcohol use: No   Drug use: No   Sexual activity: Not Currently  Other Topics Concern   Not on file  Social History Narrative   Married '611 son- '65, 1 daughter '63; 6 children (2 adopted)SO- SOBRetirement- doing well. Marriage in good health. Patient has never smoked. Alcohol use- noPt gets regular exercise   Social Determinants of Health   Financial Resource Strain: Low Risk  (04/02/2022)   Overall Financial Resource Strain (CARDIA)    Difficulty of Paying Living Expenses: Not hard at all  Food Insecurity: No Food Insecurity (10/11/2022)   Hunger Vital Sign    Worried About Running Out of Food in the Last Year: Never true    Ran Out of Food in the Last Year: Never true  Transportation Needs: No Transportation Needs (10/11/2022)   PRAPARE - Administrator, Civil Service (Medical): No    Lack of Transportation (Non-Medical): No  Physical Activity: Inactive (04/02/2022)   Exercise Vital Sign    Days of Exercise per Week: 0 days    Minutes of Exercise per  Session: 0 min  Stress: No Stress Concern Present (04/02/2022)   [employer] of Occupational Health - Occupational Stress Questionnaire    Feeling of Stress : Not at all  Social Connections: Socially Integrated (04/02/2022)   Social Connection and Isolation Panel [NHANES]    Frequency of Communication with Friends and Family: More than three times a week    Frequency of Social Gatherings with Friends and Family: More than three times a week    Attends Religious Services: More than 4 times per year    Active Member of [employer] or Organizations: Yes    Attends Engineer, structural: More than 4 times per year    Marital Status: Married  Catering manager Violence: Not At Risk (10/11/2022)   Humiliation, Afraid, Rape, and Kick questionnaire    Fear of Current or Ex-Partner: No    Emotionally Abused: No    Physically Abused: No    Sexually Abused: No    Review of systems: Review of Systems  Constitutional:  Negative for unexpected weight change.  HENT:  Negative for trouble swallowing.   Respiratory:  Positive for cough.   Gastrointestinal:  Negative for abdominal distention, abdominal pain, anal bleeding, blood in stool, constipation, diarrhea, nausea, rectal pain and vomiting.       +reflux/hearturn    Physical Exam: General: well-appearing  Eyes: sclera anicteric, no redness Skin; warm and dry, no rash or jaundice noted Neuro: awake, alert and oriented x 3. Normal gross motor function and fluent speech   Data Reviewed:  Reviewed labs, radiology imaging, old records and pertinent past GI work up   Assessment and Plan/Recommendations:  87 year old female with history of scleroderma /systemic sclerosis, interstitial lung disease/chronic parenchymal disease/pulmonary fibrosis, large pericardial effusion and pulmonary hypertension  with recurrent GI bleed secondary to gastrointestinal AVMs   She has chronic iron deficiency anemia due to chronic intermittent occult GI  blood loss She is was getting erythropoietin injections and IV iron infusions, last ferritin and iron saturation is elevated, iron infusion and erythropoietin currently on hold She has had multiple hospitalization due to recurrent GI bleed in the setting of AVMs. Insurance declined long acting octreotide to decrease transfusion requirement and hospitalizations  Weight loss and protein calorie malnutrition: Advised patient to eat small frequent meals and to drink 1-2 Ensure per day in between meals  GERD: Continue pantoprazole 40 mg daily in the morning and add Pepcid 20 mg in the evening before meals and use Carafate as needed for breakthrough symptoms Continue antireflux measures  Chronic kidney disease stage III, is followed by nephrology   Chronic idiopathic constipation and fecal incontinence due to weak anal sphincter.  Continue to use MiraLAX every other day, titrate dose based on response to have regular bowel movements.   Pulmonary fibrosis and pulmonary hypertension is followed by Pulmonary  Large pericardial effusion without cardiac tamponade: Followed by Cardiology  Return in 3 to 4 months  The patient was provided an opportunity to ask questions and all were answered. The patient agreed with the plan and demonstrated an understanding of the instructions.   I,Safa M Kadhim,acting as a scribe for Marsa Aris, MD.,have documented all relevant documentation on the behalf of Marsa Aris, MD,as directed by  Marsa Aris, MD while in the presence of Marsa Aris, MD.   I, Marsa Aris, MD, have reviewed all documentation for this visit. The documentation on 02/10/23 for the exam, diagnosis, procedures, and orders are all accurate and complete.   Iona Beard , MD    CC: [name], *

## 2023-02-10 ENCOUNTER — Encounter: Payer: Self-pay | Admitting: Gastroenterology

## 2023-02-10 ENCOUNTER — Ambulatory Visit: Payer: Medicare PPO | Admitting: Gastroenterology

## 2023-02-10 ENCOUNTER — Other Ambulatory Visit (INDEPENDENT_AMBULATORY_CARE_PROVIDER_SITE_OTHER): Payer: Medicare PPO

## 2023-02-10 VITALS — BP 118/62 | HR 68 | Ht 63.0 in | Wt 105.0 lb

## 2023-02-10 DIAGNOSIS — K219 Gastro-esophageal reflux disease without esophagitis: Secondary | ICD-10-CM | POA: Diagnosis not present

## 2023-02-10 DIAGNOSIS — D509 Iron deficiency anemia, unspecified: Secondary | ICD-10-CM | POA: Diagnosis not present

## 2023-02-10 DIAGNOSIS — K5520 Angiodysplasia of colon without hemorrhage: Secondary | ICD-10-CM

## 2023-02-10 DIAGNOSIS — I272 Pulmonary hypertension, unspecified: Secondary | ICD-10-CM | POA: Diagnosis not present

## 2023-02-10 LAB — COMPREHENSIVE METABOLIC PANEL
ALT: 11 U/L (ref 0–35)
AST: 23 U/L (ref 0–37)
Albumin: 4.1 g/dL (ref 3.5–5.2)
Alkaline Phosphatase: 65 U/L (ref 39–117)
BUN: 33 mg/dL — ABNORMAL HIGH (ref 6–23)
CO2: 31 mEq/L (ref 19–32)
Calcium: 9 mg/dL (ref 8.4–10.5)
Chloride: 98 mEq/L (ref 96–112)
Creatinine, Ser: 1.3 mg/dL — ABNORMAL HIGH (ref 0.40–1.20)
GFR: 37.77 mL/min — ABNORMAL LOW (ref >60.00)
Glucose, Bld: 101 mg/dL — ABNORMAL HIGH (ref 70–99)
Potassium: 4 mEq/L (ref 3.5–5.1)
Sodium: 136 mEq/L (ref 135–145)
Total Bilirubin: 0.5 mg/dL (ref 0.2–1.2)
Total Protein: 6.5 g/dL (ref 6.0–8.3)

## 2023-02-10 LAB — CBC
HCT: 30.5 % — ABNORMAL LOW (ref 36.0–46.0)
Hemoglobin: 10.3 g/dL — ABNORMAL LOW (ref 12.0–15.0)
MCHC: 33.7 g/dL (ref 30.0–36.0)
MCV: 107.5 fl — ABNORMAL HIGH (ref 78.0–100.0)
Platelets: 171 10*3/uL (ref 150.0–400.0)
RBC: 2.83 Mil/uL — ABNORMAL LOW (ref 3.87–5.11)
RDW: 13.2 % (ref 11.5–15.5)
WBC: 6.2 10*3/uL (ref 4.0–10.5)

## 2023-02-10 MED ORDER — PANTOPRAZOLE SODIUM 40 MG PO TBEC: 40.0000 mg | DELAYED_RELEASE_TABLET | Freq: Every day | ORAL | 11 refills | Status: DC

## 2023-02-10 MED ORDER — SUCRALFATE 1 GM/10ML PO SUSP: 1.0000 g | Freq: Every day | ORAL | 11 refills | Status: DC

## 2023-02-10 MED ORDER — FAMOTIDINE 20 MG PO TABS: 20.0000 mg | ORAL_TABLET | Freq: Every day | ORAL | 11 refills | Status: DC

## 2023-02-10 NOTE — Patient Instructions (Signed)
We have sent the following medications to your pharmacy for you to pick up at your convenience:pantoprazole in the morning, carafate at lunch and famotidine at bedtime.   Please have small frequent meals.   The [facility] providers would like to encourage you to use [facility] to communicate with providers for non-urgent requests or questions.  Due to long hold times on the telephone, sending your provider a message by [facility] may be a faster and more efficient way to get a response.  Please allow 48 business hours for a response.  Please remember that this is for non-urgent requests.

## 2023-02-11 ENCOUNTER — Encounter: Payer: Self-pay | Admitting: Physician Assistant

## 2023-02-11 ENCOUNTER — Inpatient Hospital Stay: Payer: Medicare PPO | Attending: Physician Assistant | Admitting: Physician Assistant

## 2023-02-11 ENCOUNTER — Inpatient Hospital Stay: Payer: Medicare PPO

## 2023-02-11 ENCOUNTER — Other Ambulatory Visit: Payer: Self-pay | Admitting: Gastroenterology

## 2023-02-11 ENCOUNTER — Other Ambulatory Visit: Payer: Self-pay

## 2023-02-11 VITALS — BP 131/50 | HR 84 | Temp 97.6°F | Resp 18 | Wt 104.3 lb

## 2023-02-11 DIAGNOSIS — D649 Anemia, unspecified: Secondary | ICD-10-CM | POA: Diagnosis not present

## 2023-02-11 DIAGNOSIS — N189 Chronic kidney disease, unspecified: Secondary | ICD-10-CM | POA: Insufficient documentation

## 2023-02-11 LAB — CMP (CANCER CENTER ONLY)
ALT: 10 U/L (ref 0–44)
AST: 22 U/L (ref 15–41)
Albumin: 4.1 g/dL (ref 3.5–5.0)
Alkaline Phosphatase: 63 U/L (ref 38–126)
Anion gap: 6 (ref 5–15)
BUN: 31 mg/dL — ABNORMAL HIGH (ref 8–23)
CO2: 33 mmol/L — ABNORMAL HIGH (ref 22–32)
Calcium: 9.3 mg/dL (ref 8.9–10.3)
Chloride: 100 mmol/L (ref 98–111)
Creatinine: 1.21 mg/dL — ABNORMAL HIGH (ref 0.44–1.00)
GFR, Estimated: 44 mL/min — ABNORMAL LOW
Glucose, Bld: 84 mg/dL (ref 70–99)
Potassium: 3.7 mmol/L (ref 3.5–5.1)
Sodium: 139 mmol/L (ref 135–145)
Total Bilirubin: 0.5 mg/dL (ref 0.3–1.2)
Total Protein: 6.6 g/dL (ref 6.5–8.1)

## 2023-02-11 LAB — CBC WITH DIFFERENTIAL (CANCER CENTER ONLY)
Abs Immature Granulocytes: 0.02 K/uL (ref 0.00–0.07)
Basophils Absolute: 0 K/uL (ref 0.0–0.1)
Basophils Relative: 1 %
Eosinophils Absolute: 0.1 K/uL (ref 0.0–0.5)
Eosinophils Relative: 2 %
HCT: 31.3 % — ABNORMAL LOW (ref 36.0–46.0)
Hemoglobin: 10 g/dL — ABNORMAL LOW (ref 12.0–15.0)
Immature Granulocytes: 0 %
Lymphocytes Relative: 22 %
Lymphs Abs: 1.1 K/uL (ref 0.7–4.0)
MCH: 35.5 pg — ABNORMAL HIGH (ref 26.0–34.0)
MCHC: 31.9 g/dL (ref 30.0–36.0)
MCV: 111 fL — ABNORMAL HIGH (ref 80.0–100.0)
Monocytes Absolute: 0.3 K/uL (ref 0.1–1.0)
Monocytes Relative: 7 %
Neutro Abs: 3.3 K/uL (ref 1.7–7.7)
Neutrophils Relative %: 68 %
Platelet Count: 168 K/uL (ref 150–400)
RBC: 2.82 MIL/uL — ABNORMAL LOW (ref 3.87–5.11)
RDW: 12.7 % (ref 11.5–15.5)
WBC Count: 4.8 K/uL (ref 4.0–10.5)
nRBC: 0 % (ref 0.0–0.2)

## 2023-02-11 LAB — RETIC PANEL
Immature Retic Fract: 10.5 % (ref 2.3–15.9)
RBC.: 2.7 MIL/uL — ABNORMAL LOW (ref 3.87–5.11)
Retic Count, Absolute: 29.2 K/uL (ref 19.0–186.0)
Retic Ct Pct: 1.1 % (ref 0.4–3.1)
Reticulocyte Hemoglobin: 37 pg

## 2023-02-11 LAB — LACTATE DEHYDROGENASE: LDH: 73 U/L — ABNORMAL LOW (ref 98–192)

## 2023-02-11 LAB — TECHNOLOGIST SMEAR REVIEW: Plt Morphology: NORMAL

## 2023-02-11 NOTE — Progress Notes (Signed)
[facility] Telephone:([phone]   Fax:(336) [phone]  INITIAL CONSULT NOTE  Patient Care Team: [name], MD as PCP - General (Internal Medicine)   CHIEF COMPLAINTS/PURPOSE OF CONSULTATION:  Normocytic anemia  HISTORY OF PRESENTING ILLNESS:  Paula Ward 87 y.o. female with medical history significant for breast cancer s/p lumpectomy and radiation, renal carcinoma s/p ablation, CREST syndrome, CKD, pulmonary hypertension and arteriovenous malformation. She presents to the hematology clinic for evaluation of normocytic anemia. She is accompanied by her husband for this visit.    On review of the previous records, there is evidence of chronic anemia for several years stable between 10-11. Most recent labs from 2/29/204 showed Hgb had declined to 9.8, MCV 100.3. No other cytopenias. Iron panel shows no evidence of iron deficiency.  She has received periodic iron infusions, most recently received IV feraheme 510 mg x 2 doses in October 2023. She was last given 2 units of PRBC in December 2023 after being hospitalized for fracture of left femur which required intramedullary nailing with ortho. Lastly, she receives retracrit 10,000 units every 4 weeks, last given on 12/30/2022.   On exam today, Paula Ward reports that she does get easily tired and needs to rest frequently. She tries to complete her baseline ADLS. She reports her appetite are weight are unchanged. She denies any nausea, vomiting or abdominal pain. Her bowel habits are unchanged without recurrent episodes of diarrhea or constipation. She denies any signs of bleeding including hematochezia or melena. She has chronic shortness of breath with exertion due to pulmonary hypertension. She is on 2 L of supplemental oxygen only at night. She denies fevers, chills, sweats, chest pain or cough. She has no other complaints. Rest of the 10 ROS is below.   MEDICAL HISTORY:  Past Medical History:  Diagnosis Date    Anemia    Angiodysplasia of stomach and duodenum    Arthus phenomenon    AVM (arteriovenous malformation)    Blood transfusion without reported diagnosis    last 4 units 12-22-15, Iron infusion x2 last -01-07-16,01-14-16.   Breast cancer 1989   Left   Candida esophagitis    Cataract    Chronic kidney disease    Chronic mild renal insuffiency   CREST syndrome    GERD (gastroesophageal reflux disease)    w/ HH   Interstitial lung disease    Nodule of kidney    Pericardial effusion    PONV (postoperative nausea and vomiting)    Pulmonary embolus 2003   Pulmonary hypertension    followed by Dr Bland Span at [facility], now Dr. Penni Bombard visit end 2'17.   Rectal incontinence    Renal cell carcinoma    Scleroderma    Tubular adenoma of colon    Uterine polyp     SURGICAL HISTORY: Past Surgical History:  Procedure Laterality Date   APPENDECTOMY  1962   BREAST LUMPECTOMY  1989   left   CATARACT EXTRACTION, BILATERAL Bilateral 12/2013   COLONOSCOPY WITH PROPOFOL N/A 04/15/2021   Procedure: COLONOSCOPY WITH PROPOFOL;  Surgeon: Rachael Fee, MD;  Location: [facility];  Service: Endoscopy;  Laterality: N/A;   ENTEROSCOPY N/A 01/18/2016   Procedure: ENTEROSCOPY;  Surgeon: Napoleon Form, MD;  Location: [facility];  Service: Endoscopy;  Laterality: N/A;   ENTEROSCOPY N/A 05/24/2018   Procedure: ENTEROSCOPY;  Surgeon: Lynann Bologna, MD;  Location: [facility];  Service: Endoscopy;  Laterality: N/A;   ENTEROSCOPY N/A 03/29/2021   Procedure: ENTEROSCOPY;  Surgeon: Beverley Fiedler, MD;  Location: [facility];  Service: Gastroenterology;  Laterality: N/A;   ENTEROSCOPY N/A 04/13/2021   Procedure: ENTEROSCOPY;  Surgeon: Benancio Deeds, MD;  Location: [facility];  Service: Gastroenterology;  Laterality: N/A;   ESOPHAGOGASTRODUODENOSCOPY (EGD) WITH PROPOFOL N/A 12/21/2015   Procedure: ESOPHAGOGASTRODUODENOSCOPY (EGD) WITH PROPOFOL;  Surgeon: Hilarie Fredrickson, MD;  Location: [facility];   Service: Endoscopy;  Laterality: N/A;   HOT HEMOSTASIS N/A 05/24/2018   Procedure: HOT HEMOSTASIS (ARGON PLASMA COAGULATION/BICAP);  Surgeon: Lynann Bologna, MD;  Location: [facility];  Service: Endoscopy;  Laterality: N/A;   HOT HEMOSTASIS N/A 03/29/2021   Procedure: HOT HEMOSTASIS (ARGON PLASMA COAGULATION/BICAP);  Surgeon: Beverley Fiedler, MD;  Location: [facility];  Service: Gastroenterology;  Laterality: N/A;   HOT HEMOSTASIS N/A 04/13/2021   Procedure: HOT HEMOSTASIS (ARGON PLASMA COAGULATION/BICAP);  Surgeon: Benancio Deeds, MD;  Location: [facility];  Service: Gastroenterology;  Laterality: N/A;   INTRAMEDULLARY (IM) NAIL INTERTROCHANTERIC Left 10/11/2022   Procedure: INTRAMEDULLARY (IM) NAIL INTERTROCHANTERIC;  Surgeon: Ollen Gross, MD;  [facility]: [facility];  Service: Orthopedics;  Laterality: Left;   IR GENERIC HISTORICAL  06/05/2016   IR RADIOLOGIST EVAL & MGMT 06/05/2016 [name], MD [facility] INTERV RAD   IVC Filter     KIDNEY SURGERY     left -"laser surgery by Dr. Fredia Sorrow- 5 yrs ago" no removal   RIGHT/LEFT HEART CATH AND CORONARY ANGIOGRAPHY N/A 04/25/2022   Procedure: RIGHT/LEFT HEART CATH AND CORONARY ANGIOGRAPHY;  Surgeon: Tonny Bollman, MD;  Location: [facility] INVASIVE CV LAB;  Service: Cardiovascular;  Laterality: N/A;   SCHLEROTHERAPY  05/24/2018   Procedure: Theresia Majors;  Surgeon: Lynann Bologna, MD;  Location: [facility];  Service: Endoscopy;;   SUBMUCOSAL TATTOO INJECTION  04/13/2021   Procedure: SUBMUCOSAL TATTOO INJECTION;  Surgeon: Benancio Deeds, MD;  Location: [facility];  Service: Gastroenterology;;   TONSILLECTOMY     TUBAL LIGATION      SOCIAL HISTORY: Social History   Socioeconomic History   Marital status: Married    Spouse name: Not on file   Number of children: 2   Years of education: Not on file   Highest education level: Not on file  Occupational History   Occupation: Retired  Tobacco Use   Smoking status: Never   Smokeless  tobacco: Never  Vaping Use   Vaping Use: Never used  Substance and Sexual Activity   Alcohol use: No   Drug use: No   Sexual activity: Not Currently  Other Topics Concern   Not on file  Social History Narrative   Married '611 son- '65, 1 daughter '63; 6 children (2 adopted)SO- SOBRetirement- doing well. Marriage in good health. Patient has never smoked. Alcohol use- noPt gets regular exercise   Social Determinants of Health   Financial Resource Strain: Low Risk  (04/02/2022)   Overall Financial Resource Strain (CARDIA)    Difficulty of Paying Living Expenses: Not hard at all  Food Insecurity: No Food Insecurity (10/11/2022)   Hunger Vital Sign    Worried About Running Out of Food in the Last Year: Never true    Ran Out of Food in the Last Year: Never true  Transportation Needs: No Transportation Needs (10/11/2022)   PRAPARE - Administrator, Civil Service (Medical): No    Lack of Transportation (Non-Medical): No  Physical Activity: Inactive (04/02/2022)   Exercise Vital Sign    Days of Exercise per Week: 0 days    Minutes of Exercise per  Session: 0 min  Stress: No Stress Concern Present (04/02/2022)   [employer] of Occupational Health - Occupational Stress Questionnaire    Feeling of Stress : Not at all  Social Connections: Socially Integrated (04/02/2022)   Social Connection and Isolation Panel [NHANES]    Frequency of Communication with Friends and Family: More than three times a week    Frequency of Social Gatherings with Friends and Family: More than three times a week    Attends Religious Services: More than 4 times per year    Active Member of [employer] or Organizations: Yes    Attends Engineer, structural: More than 4 times per year    Marital Status: Married  Catering manager Violence: Not At Risk (10/11/2022)   Humiliation, Afraid, Rape, and Kick questionnaire    Fear of Current or Ex-Partner: No    Emotionally Abused: No    Physically Abused:  No    Sexually Abused: No    FAMILY HISTORY: Family History  Problem Relation Age of Onset   Bladder Cancer Father    Diabetes Father    Prostate cancer Father    Alzheimer's disease Mother    Diabetes Sister    Lung cancer Sister         smoker   Esophageal cancer Paternal Uncle    Colon cancer Neg Hx     ALLERGIES:  is allergic to codeine, other, erythromycin, iodinated contrast media, lisinopril, mycophenolate mofetil, and sulfa antibiotics.  MEDICATIONS:  Current Outpatient Medications  Medication Sig Dispense Refill   acetaminophen (TYLENOL) 325 MG tablet Take 2 tablets (650 mg total) by mouth every 6 (six) hours as needed for moderate pain. 30 tablet 0   albuterol (VENTOLIN HFA) 108 (90 Base) MCG/ACT inhaler INHALE 2 PUFFS INTO THE LUNGS EVERY 6 HOURS AS NEEDED FOR WHEEZING OR SHORTNESS OF BREATH 6.7 g 3   citalopram (CELEXA) 10 MG tablet Take 1 tablet (10 mg total) by mouth daily. (Patient taking differently: Take 10 mg by mouth daily as needed (depression).) 30 tablet 3   cyanocobalamin (VITAMIN B12) 1000 MCG tablet Take 1,000 mcg by mouth daily.     diltiazem (CARDIZEM SR) 60 MG 12 hr capsule Take 1 capsule (60 mg total) by mouth daily. 30 capsule 1   docusate sodium (COLACE) 100 MG capsule Take 1 capsule (100 mg total) by mouth 2 (two) times daily. 10 capsule 0   famotidine (PEPCID) 20 MG tablet Take 1 tablet (20 mg total) by mouth at bedtime. 30 tablet 11   fluticasone (FLONASE) 50 MCG/ACT nasal spray Place 1 spray into both nostrils daily as needed for allergies. (Patient taking differently: Place 1 spray into both nostrils in the morning and at bedtime.) 48 g 0   furosemide (LASIX) 20 MG tablet Take 1 tablet by mouth once daily 90 tablet 0   guaiFENesin (MUCINEX) 600 MG 12 hr tablet Take 300 mg by mouth daily.     ipratropium (ATROVENT) 0.03 % nasal spray Place 2 sprays into both nostrils every 12 (twelve) hours. 30 mL 12   latanoprost (XALATAN) 0.005 % ophthalmic  solution Place 1 drop into both eyes at bedtime.     magic mouthwash SOLN Take 10 mLs by mouth 4 (four) times daily as needed for mouth pain. 15 mL 2   montelukast (SINGULAIR) 10 MG tablet TAKE 1 TABLET BY MOUTH AT BEDTIME 90 tablet 0   mupirocin ointment (BACTROBAN) 2 % Apply 1 Application topically 2 (two) times daily as needed (  dry skin).     ondansetron (ZOFRAN) 4 MG tablet Take 1 tablet (4 mg total) by mouth every 6 (six) hours as needed for nausea. 20 tablet 0   OPSUMIT 10 MG TABS Take 10 mg by mouth daily.     OXYGEN Inhale into the lungs. 2 LMP at night and upon exertion     pantoprazole (PROTONIX) 40 MG tablet Take 1 tablet (40 mg total) by mouth daily. 30 tablet 11   polyethylene glycol (MIRALAX / GLYCOLAX) 17 g packet Take 17 g by mouth daily. 14 each 0   spironolactone (ALDACTONE) 25 MG tablet Take 0.5 tablets (12.5 mg total) by mouth daily. 45 tablet 3   sucralfate (CARAFATE) 1 GM/10ML suspension Take 10 mLs (1 g total) by mouth daily. Lunch time 30 mL 11   diltiazem (CARDIZEM CD) 120 MG 24 hr capsule Take 1 capsule (120 mg total) by mouth in the morning. (Patient not taking: Reported on 02/11/2023) 90 capsule 3   HYDROcodone-acetaminophen (NORCO/VICODIN) 5-325 MG tablet Take 1-2 tablets by mouth every 6 (six) hours as needed for moderate pain. (Patient not taking: Reported on 02/10/2023) 42 tablet 0   methocarbamol (ROBAXIN) 500 MG tablet Take 1 tablet (500 mg total) by mouth every 6 (six) hours as needed for muscle spasms. (Patient not taking: Reported on 02/10/2023) 40 tablet 0   Multiple Vitamin (MULTIVITAMIN WITH MINERALS) TABS tablet Take 1 tablet by mouth daily. (Patient not taking: Reported on 02/11/2023)     predniSONE (DELTASONE) 10 MG tablet Take 3 tablets (30 mg total) by mouth daily with breakfast. (Patient not taking: Reported on 02/10/2023) 15 tablet 0   No current facility-administered medications for this visit.    REVIEW OF SYSTEMS:   Constitutional: ( - ) fevers, ( - )   chills , ( - ) night sweats Eyes: ( - ) blurriness of vision, ( - ) double vision, ( - ) watery eyes Ears, nose, mouth, throat, and face: ( - ) mucositis, ( - ) sore throat Respiratory: ( - ) cough, ( + ) dyspnea, ( - ) wheezes Cardiovascular: ( - ) palpitation, ( - ) chest discomfort, ( - ) lower extremity swelling Gastrointestinal:  ( - ) nausea, ( - ) heartburn, ( - ) change in bowel habits Skin: ( - ) abnormal skin rashes Lymphatics: ( - ) new lymphadenopathy, ( - ) easy bruising Neurological: ( - ) numbness, ( - ) tingling, ( - ) new weaknesses Behavioral/Psych: ( - ) mood change, ( - ) new changes  All other systems were reviewed with the patient and are negative.  PHYSICAL EXAMINATION: ECOG PERFORMANCE STATUS: 1 - Symptomatic but completely ambulatory  Vitals:   02/11/23 0915  BP: (!) 131/50  Pulse: 84  Resp: 18  Temp: 97.6 F (36.4 C)  SpO2: 96%   Filed Weights   02/11/23 0915  Weight: 104 lb 4.8 oz (47.3 kg)    GENERAL: well appearing elderly female in NAD  SKIN: skin color, texture, turgor are normal, no rashes or significant lesions EYES: conjunctiva are pink and non-injected, sclera clear OROPHARYNX: no exudate, no erythema; lips, buccal mucosa, and tongue normal  NECK: supple, non-tender LYMPH:  no palpable lymphadenopathy in the cervical or supraclavicular lymph nodes.  LUNGS: clear to auscultation and percussion with normal breathing effort HEART: regular rate & rhythm and no murmurs and no lower extremity edema Musculoskeletal: no cyanosis of digits and no clubbing  PSYCH: alert & oriented x 3, fluent speech  NEURO: no focal motor/sensory deficits  LABORATORY DATA:  I have reviewed the data as listed    Latest Ref Rng & Units 02/10/2023    3:51 PM 12/18/2022    4:20 PM 11/11/2022    8:56 AM  CBC  WBC 4.0 - 10.5 K/uL 6.2  7.6  6.0   Hemoglobin 12.0 - 15.0 g/dL 16.1  9.8  09.6   Hematocrit 36.0 - 46.0 % 30.5  28.9  35.3   Platelets 150.0 - 400.0 K/uL  171.0  198.0  281.0        Latest Ref Rng & Units 02/10/2023    3:51 PM 12/18/2022    4:20 PM 11/11/2022    8:56 AM  CMP  Glucose 70 - 99 mg/dL 045  89  88   BUN 6 - 23 mg/dL 33  39  45   Creatinine 0.40 - 1.20 mg/dL 4.09  8.11  9.14   Sodium 135 - 145 mEq/L 136  139  140   Potassium 3.5 - 5.1 mEq/L 4.0  4.0  4.1   Chloride 96 - 112 mEq/L 98  99  100   CO2 19 - 32 mEq/L 31  31  31    Calcium 8.4 - 10.5 mg/dL 9.0  9.4  9.5   Total Protein 6.0 - 8.3 g/dL 6.5  6.6  6.7   Total Bilirubin 0.2 - 1.2 mg/dL 0.5  0.5  0.6   Alkaline Phos 39 - 117 U/L 65  79  105   AST 0 - 37 U/L 23  21  17    ALT 0 - 35 U/L 11  9  7     RADIOGRAPHIC STUDIES: I have personally reviewed the radiological images as listed and agreed with the findings in the report. ECHOCARDIOGRAM COMPLETE  Result Date: 01/15/2023    ECHOCARDIOGRAM REPORT   Patient Name:   Paula Ward Date of Exam: 01/15/2023 Medical Rec #:  [id number]       Height:       63.0 in Accession #:    [id number]      Weight:       103.0 lb Date of Birth:  [birth year]       BSA:          1.458 m Patient Age:    86 years        BP:           111/48 mmHg Patient Gender: F               HR:           79 bpm. Exam Location:  [address] Procedure: 2D Echo, Cardiac Doppler, Color Doppler and Strain Analysis Indications:    Pericardial effusion I31.3  History:        Patient has prior history of Echocardiogram examinations, most                 recent 03/18/2022. Pulmonary HTN; Risk Factors:GERD. Pericardial                 Effusion.  Sonographer:    Eulah Pont RDCS Referring Phys: [id number] BRIAN S CRENSHAW  Sonographer Comments: Image acquisition challenging due to breast implants. Global longitudinal strain was attempted. IMPRESSIONS  1. Left ventricular ejection fraction, by estimation, is 60 to 65%. The left ventricle has normal function. The left ventricle has no regional wall motion abnormalities. There is moderate left ventricular hypertrophy. Left ventricular  diastolic parameters are consistent with Grade I  diastolic dysfunction (impaired relaxation).  2. Right ventricular systolic function is normal. The right ventricular size is normal. There is moderately elevated pulmonary artery systolic pressure.  3. Left atrial size was severely dilated.  4. Large pericardial effusion.  5. No evidence of mitral valve regurgitation. Mild to moderate mitral stenosis. The mean mitral valve gradient is 7.0 mmHg. Severe mitral annular calcification.  6. Tricuspid valve regurgitation is mild to moderate.  7. The aortic valve is calcified. Aortic valve regurgitation is moderate. Conclusion(s)/Recommendation(s): Pericardial effusion appears mildly larger, no significant echo features of tamponade, recommend clinical correlation. FINDINGS  Left Ventricle: Left ventricular ejection fraction, by estimation, is 60 to 65%. The left ventricle has normal function. The left ventricle has no regional wall motion abnormalities. The left ventricular internal cavity size was normal in size. There is  moderate left ventricular hypertrophy. Left ventricular diastolic parameters are consistent with Grade I diastolic dysfunction (impaired relaxation). Right Ventricle: The right ventricular size is normal. Right ventricular systolic function is normal. There is moderately elevated pulmonary artery systolic pressure. The tricuspid regurgitant velocity is 3.35 m/s, and with an assumed right atrial pressure of 3 mmHg, the estimated right ventricular systolic pressure is 47.9 mmHg. Left Atrium: Left atrial size was severely dilated. Right Atrium: Right atrial size was normal in size. Pericardium: A large pericardial effusion is present. Mitral Valve: Severe mitral annular calcification. No evidence of mitral valve regurgitation. Mild to moderate mitral valve stenosis. MV peak gradient, 12.7 mmHg. The mean mitral valve gradient is 7.0 mmHg. Tricuspid Valve: Tricuspid valve regurgitation is mild to moderate.  Aortic Valve: The aortic valve is calcified. Aortic valve regurgitation is moderate. Aortic valve mean gradient measures 34.0 mmHg. Aortic valve peak gradient measures 72.5 mmHg. Aortic valve area, by VTI measures 1.39 cm. Pulmonic Valve: The pulmonic valve was not well visualized. Aorta: The aortic root and ascending aorta are structurally normal, with no evidence of dilitation. IAS/Shunts: No atrial level shunt detected by color flow Doppler.  LEFT VENTRICLE PLAX 2D LVIDd:         3.90 cm LVIDs:         2.10 cm LV PW:         1.00 cm LV IVS:        1.00 cm LVOT diam:     2.30 cm LV SV:         126 LV SV Index:   86 LVOT Area:     4.15 cm  LV Volumes (MOD) LV vol d, MOD A2C: 61.3 ml LV vol d, MOD A4C: 83.3 ml LV vol s, MOD A2C: 24.7 ml LV vol s, MOD A4C: 27.5 ml LV SV MOD A2C:     36.6 ml LV SV MOD A4C:     83.3 ml LV SV MOD BP:      45.3 ml RIGHT VENTRICLE RV S prime:     10.30 cm/s TAPSE (M-mode): 1.9 cm LEFT ATRIUM             Index        RIGHT ATRIUM           Index LA diam:        3.70 cm 2.54 cm/m   RA Area:     13.70 cm LA Vol (A2C):   70.3 ml 48.20 ml/m  RA Volume:   28.30 ml  19.40 ml/m LA Vol (A4C):   68.8 ml 47.17 ml/m LA Biplane Vol: 73.0 ml 50.05 ml/m  AORTIC VALVE  PULMONIC VALVE AV Area (Vmax):    1.37 cm      PR End Diast Vel: 3.28 msec AV Area (Vmean):   1.45 cm AV Area (VTI):     1.39 cm AV Vmax:           425.67 cm/s AV Vmean:          266.667 cm/s AV VTI:            0.904 m AV Peak Grad:      72.5 mmHg AV Mean Grad:      34.0 mmHg LVOT Vmax:         140.00 cm/s LVOT Vmean:        92.900 cm/s LVOT VTI:          0.302 m LVOT/AV VTI ratio: 0.33  AORTA Ao Root diam: 3.00 cm Ao Asc diam:  3.20 cm MITRAL VALVE                TRICUSPID VALVE MV Area (PHT): 2.80 cm     TR Peak grad:   44.9 mmHg MV Area VTI:   2.54 cm     TR Vmax:        335.00 cm/s MV Peak grad:  12.7 mmHg MV Mean grad:  7.0 mmHg     SHUNTS MV Vmax:       1.78 m/s     Systemic VTI:  0.30 m MV Vmean:       124.0 cm/s   Systemic Diam: 2.30 cm MV Decel Time: 271 msec MV E velocity: 162.00 cm/s MV A velocity: 176.00 cm/s MV E/A ratio:  0.92 Carolan Clines Electronically signed by Carolan Clines Signature Date/Time: 01/15/2023/4:06:12 PM    Final     ASSESSMENT & PLAN Paula Ward is a 87 y.o. female who presents to the hematology clinic for evaluation of normocytic anemia. We reviewed possible etiologies for her anemia including nutritional deficiences, chronic kidney disease, hemolysis, paraprotienemia or other bone marrow disorders.   Per review of recent labs, there is no evidence of iron, vitamin B12 or folate deficiencies. Ms. Skidmore will proceed with laboratory workup to evaluate for other underlying causes. Patient expressed understanding of the plan provided.   #Normocytic anemia: --Etiology unknown but chronic in nature. CKD is part of differential.  --Iron panel from 12/30/2022 reviewed without deficiency.  --Vitamin B12 and folate from 10/12/2023 reviewed without deficiency.  --Labs today to check CBC, CMP, LDH, haptoglobin, SPEP/IFE, sFLC, retic panel, copper levels and peripheral smear.  --Patient has retacrit 10,000 units q 4 weeks, last received 12/30/2022. --Discussed role for bone marrow biopsy based on today's labs.  --RTC once workup is complete.   No orders of the defined types were placed in this encounter.   All questions were answered. The patient knows to call the clinic with any problems, questions or concerns.  I have spent a total of 60 minutes minutes of face-to-face and non-face-to-face time, preparing to see the patient, obtaining and/or reviewing separately obtained history, performing a medically appropriate examination, counseling and educating the patient, ordering tests/procedures,documenting clinical information in the electronic health record, and care coordination.   Georga Kaufmann, PA-C Department of Hematology/Oncology [facility] Phone: [phone]

## 2023-02-12 LAB — KAPPA/LAMBDA LIGHT CHAINS
Kappa free light chain: 50.7 mg/L — ABNORMAL HIGH (ref 3.3–19.4)
Kappa, lambda light chain ratio: 2.12 — ABNORMAL HIGH (ref 0.26–1.65)
Lambda free light chains: 23.9 mg/L (ref 5.7–26.3)

## 2023-02-12 LAB — HAPTOGLOBIN: Haptoglobin: 159 mg/dL (ref 41–333)

## 2023-02-13 ENCOUNTER — Encounter: Payer: Self-pay | Admitting: Gastroenterology

## 2023-02-13 ENCOUNTER — Encounter (HOSPITAL_COMMUNITY): Payer: Self-pay

## 2023-02-13 LAB — COPPER, SERUM: Copper: 99 ug/dL (ref 80–158)

## 2023-02-16 LAB — MULTIPLE MYELOMA PANEL, SERUM
Albumin SerPl Elph-Mcnc: 3.8 g/dL (ref 2.9–4.4)
Albumin/Glob SerPl: 1.6 (ref 0.7–1.7)
Alpha 1: 0.2 g/dL (ref 0.0–0.4)
Alpha2 Glob SerPl Elph-Mcnc: 0.7 g/dL (ref 0.4–1.0)
B-Globulin SerPl Elph-Mcnc: 0.7 g/dL (ref 0.7–1.3)
Gamma Glob SerPl Elph-Mcnc: 0.8 g/dL (ref 0.4–1.8)
Globulin, Total: 2.4 g/dL (ref 2.2–3.9)
IgA: 191 mg/dL (ref 64–422)
IgG (Immunoglobin G), Serum: 794 mg/dL (ref 586–1602)
IgM (Immunoglobulin M), Srm: 160 mg/dL (ref 26–217)
Total Protein ELP: 6.2 g/dL (ref 6.0–8.5)

## 2023-02-17 ENCOUNTER — Telehealth: Payer: Self-pay | Admitting: Physician Assistant

## 2023-02-17 DIAGNOSIS — D649 Anemia, unspecified: Secondary | ICD-10-CM

## 2023-02-18 ENCOUNTER — Other Ambulatory Visit: Payer: Self-pay | Admitting: Internal Medicine

## 2023-02-19 ENCOUNTER — Inpatient Hospital Stay: Payer: Medicare PPO | Attending: Physician Assistant

## 2023-02-19 ENCOUNTER — Telehealth: Payer: Self-pay | Admitting: Physician Assistant

## 2023-02-19 ENCOUNTER — Other Ambulatory Visit: Payer: Self-pay

## 2023-02-19 DIAGNOSIS — D649 Anemia, unspecified: Secondary | ICD-10-CM | POA: Insufficient documentation

## 2023-02-19 LAB — VITAMIN B12: Vitamin B-12: 273 pg/mL (ref 180–914)

## 2023-02-19 LAB — FOLATE: Folate: 9.9 ng/mL (ref >5.9)

## 2023-02-24 ENCOUNTER — Encounter: Payer: Self-pay | Admitting: Physician Assistant

## 2023-02-24 LAB — METHYLMALONIC ACID, SERUM: Methylmalonic Acid, Quantitative: 585 nmol/L — ABNORMAL HIGH (ref 0–378)

## 2023-02-26 ENCOUNTER — Other Ambulatory Visit (HOSPITAL_COMMUNITY): Payer: Self-pay | Admitting: Physician Assistant

## 2023-02-26 DIAGNOSIS — I788 Other diseases of capillaries: Secondary | ICD-10-CM | POA: Diagnosis not present

## 2023-02-26 DIAGNOSIS — Z5189 Encounter for other specified aftercare: Secondary | ICD-10-CM | POA: Diagnosis not present

## 2023-02-26 DIAGNOSIS — M25562 Pain in left knee: Secondary | ICD-10-CM | POA: Diagnosis not present

## 2023-02-26 DIAGNOSIS — L570 Actinic keratosis: Secondary | ICD-10-CM | POA: Diagnosis not present

## 2023-02-26 DIAGNOSIS — L308 Other specified dermatitis: Secondary | ICD-10-CM | POA: Diagnosis not present

## 2023-02-26 MED ORDER — VITAMIN B-12 1000 MCG PO TABS: 1000.0000 ug | ORAL_TABLET | Freq: Every day | ORAL | 3 refills | Status: DC

## 2023-03-02 ENCOUNTER — Telehealth: Payer: Self-pay | Admitting: Physician Assistant

## 2023-03-02 DIAGNOSIS — I509 Heart failure, unspecified: Secondary | ICD-10-CM | POA: Diagnosis not present

## 2023-03-02 DIAGNOSIS — E538 Deficiency of other specified B group vitamins: Secondary | ICD-10-CM

## 2023-03-02 NOTE — Telephone Encounter (Signed)
I called [name] to review the lab results from 02/19/2023 as the mychart message was not read per my review. Findings confirm B12 deficiency. She is currently taking vitamin B12 supplementation 1000 mcg daily so I recommend Vitamin B12 IM injections weekly x 4 doses and then transition to monthly injections. I will see patient back in 4 weeks with repeat labs. If anemia does not improve with B12 levels, we will pursue bone marrow biopsy as next step. Patient expressed understanding of the plan provided.

## 2023-03-03 ENCOUNTER — Telehealth: Payer: Self-pay | Admitting: Physician Assistant

## 2023-03-03 NOTE — Telephone Encounter (Signed)
Patient called requesting to hold off on B12 injections as she wants to try sublingual B12 supplementation. We will see patient  back on 6/5 for repeat labs. If B12 levels and Hgb does not improve by then, we can discuss B12 injections. Patient expressed understanding of the plan provided.

## 2023-03-04 ENCOUNTER — Inpatient Hospital Stay: Payer: Medicare PPO

## 2023-03-06 ENCOUNTER — Encounter: Payer: Self-pay | Admitting: Physician Assistant

## 2023-03-06 ENCOUNTER — Encounter (HOSPITAL_COMMUNITY): Payer: Self-pay

## 2023-03-06 NOTE — Telephone Encounter (Signed)
Error

## 2023-03-11 ENCOUNTER — Inpatient Hospital Stay: Payer: Medicare PPO

## 2023-03-11 ENCOUNTER — Ambulatory Visit: Payer: Medicare PPO | Admitting: Internal Medicine

## 2023-03-12 NOTE — Telephone Encounter (Signed)
A user error has taken place.Marland KitchenMarland KitchenMarland KitchenClosing encounter/lmb Mammogram was abstracted by CMA

## 2023-03-13 ENCOUNTER — Ambulatory Visit: Payer: Medicare PPO | Admitting: Internal Medicine

## 2023-03-13 ENCOUNTER — Encounter: Payer: Self-pay | Admitting: Internal Medicine

## 2023-03-13 VITALS — BP 100/60 | HR 92 | Temp 98.4°F | Ht 63.0 in | Wt 101.0 lb

## 2023-03-13 DIAGNOSIS — N1832 Chronic kidney disease, stage 3b: Secondary | ICD-10-CM | POA: Diagnosis not present

## 2023-03-13 DIAGNOSIS — D638 Anemia in other chronic diseases classified elsewhere: Secondary | ICD-10-CM

## 2023-03-13 DIAGNOSIS — J9611 Chronic respiratory failure with hypoxia: Secondary | ICD-10-CM

## 2023-03-13 DIAGNOSIS — G3281 Cerebellar 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: Secondary | ICD-10-CM | POA: Diagnosis not present

## 2023-03-13 DIAGNOSIS — I272 Pulmonary hypertension, unspecified: Secondary | ICD-10-CM | POA: Diagnosis not present

## 2023-03-13 DIAGNOSIS — I350 Nonrheumatic aortic (valve) stenosis: Secondary | ICD-10-CM | POA: Diagnosis not present

## 2023-03-13 DIAGNOSIS — J849 Interstitial pulmonary disease, unspecified: Secondary | ICD-10-CM | POA: Diagnosis not present

## 2023-03-13 DIAGNOSIS — C642 Malignant neoplasm of left kidney, except renal pelvis: Secondary | ICD-10-CM | POA: Diagnosis not present

## 2023-03-13 DIAGNOSIS — F4321 Adjustment disorder with depressed mood: Secondary | ICD-10-CM

## 2023-03-13 DIAGNOSIS — M349 Systemic sclerosis, unspecified: Secondary | ICD-10-CM

## 2023-03-13 DIAGNOSIS — E538 Deficiency of other specified B group vitamins: Secondary | ICD-10-CM | POA: Diagnosis not present

## 2023-03-13 LAB — COMPREHENSIVE METABOLIC PANEL
ALT: 12 U/L (ref 0–35)
AST: 22 U/L (ref 0–37)
Albumin: 4.3 g/dL (ref 3.5–5.2)
Alkaline Phosphatase: 60 U/L (ref 39–117)
BUN: 34 mg/dL — ABNORMAL HIGH (ref 6–23)
CO2: 31 mEq/L (ref 19–32)
Calcium: 9.3 mg/dL (ref 8.4–10.5)
Chloride: 99 mEq/L (ref 96–112)
Creatinine, Ser: 1.45 mg/dL — ABNORMAL HIGH (ref 0.40–1.20)
GFR: 33.11 mL/min — ABNORMAL LOW (ref >60.00)
Glucose, Bld: 96 mg/dL (ref 70–99)
Potassium: 4.2 mEq/L (ref 3.5–5.1)
Sodium: 138 mEq/L (ref 135–145)
Total Bilirubin: 0.5 mg/dL (ref 0.2–1.2)
Total Protein: 6.8 g/dL (ref 6.0–8.3)

## 2023-03-13 LAB — MICROALBUMIN / CREATININE URINE RATIO
Creatinine,U: 135.4 mg/dL
Microalb Creat Ratio: 22.4 mg/g (ref 0.0–30.0)
Microalb, Ur: 30.3 mg/dL — ABNORMAL HIGH (ref 0.0–1.9)

## 2023-03-13 LAB — URINALYSIS, ROUTINE W REFLEX MICROSCOPIC
Hgb urine dipstick: NEGATIVE
Leukocytes,Ua: NEGATIVE
Nitrite: NEGATIVE
Specific Gravity, Urine: 1.02 (ref 1.000–1.030)
Total Protein, Urine: 100 — AB
Urine Glucose: NEGATIVE
Urobilinogen, UA: 1 (ref 0.0–1.0)
pH: 5.5 (ref 5.0–8.0)

## 2023-03-13 LAB — CBC
HCT: 32.8 % — ABNORMAL LOW (ref 36.0–46.0)
Hemoglobin: 10.7 g/dL — ABNORMAL LOW (ref 12.0–15.0)
MCHC: 32.6 g/dL (ref 30.0–36.0)
MCV: 107.4 fl — ABNORMAL HIGH (ref 78.0–100.0)
Platelets: 183 10*3/uL (ref 150.0–400.0)
RBC: 3.05 Mil/uL — ABNORMAL LOW (ref 3.87–5.11)
RDW: 13.2 % (ref 11.5–15.5)
WBC: 5.1 10*3/uL (ref 4.0–10.5)

## 2023-03-13 LAB — VITAMIN B12: Vitamin B-12: >1500 pg/mL — ABNORMAL HIGH (ref 211–911)

## 2023-03-13 MED ORDER — ALBUTEROL SULFATE HFA 108 (90 BASE) MCG/ACT IN AERS: INHALATION_SPRAY | RESPIRATORY_TRACT | 3 refills | Status: DC

## 2023-03-13 MED ORDER — MIRTAZAPINE 15 MG PO TABS: 15.0000 mg | ORAL_TABLET | Freq: Every day | ORAL | 3 refills | Status: DC

## 2023-03-13 NOTE — Assessment & Plan Note (Signed)
Checking microalbumin to creatinine ratio and U/A today as well as CMP.

## 2023-03-13 NOTE — Assessment & Plan Note (Signed)
Has telangectasias on legs on exam today. Counseled on the benign nature of these.

## 2023-03-13 NOTE — Patient Instructions (Addendum)
We will check the labs and the urine today.  We have sent in mirtazepine (remeron) to use at night time for the sleep.

## 2023-03-13 NOTE — Assessment & Plan Note (Signed)
Stable murmur on exam.

## 2023-03-13 NOTE — Progress Notes (Signed)
   Subjective:   Patient ID: Paula Ward, female    DOB: 08-16-1936, 87 y.o.   MRN: [id number]  Rash Pertinent negatives include no cough, diarrhea, shortness of breath or vomiting.   The patient is an 87 YO female coming in for several concerns/follow up.  Review of Systems  Constitutional:  Positive for activity change.  HENT: Negative.    Eyes: Negative.   Respiratory:  Negative for cough, chest tightness and shortness of breath.   Cardiovascular:  Negative for chest pain, palpitations and leg swelling.  Gastrointestinal:  Negative for abdominal distention, abdominal pain, constipation, diarrhea, nausea and vomiting.  Musculoskeletal: Negative.   Skin:  Positive for rash.  Neurological: Negative.   Psychiatric/Behavioral:  Positive for sleep disturbance.     Objective:  Physical Exam Constitutional:      Appearance: She is well-developed.  HENT:     Head: Normocephalic and atraumatic.  Cardiovascular:     Rate and Rhythm: Normal rate and regular rhythm.     Heart sounds: Murmur heard.  Pulmonary:     Effort: Pulmonary effort is normal. No respiratory distress.     Breath sounds: Normal breath sounds. No wheezing or rales.  Abdominal:     General: Bowel sounds are normal. There is no distension.     Palpations: Abdomen is soft.     Tenderness: There is no abdominal tenderness. There is no rebound.  Musculoskeletal:     Cervical back: Normal range of motion.  Skin:    General: Skin is warm and dry.  Neurological:     Mental Status: She is alert and oriented to person, place, and time.     Coordination: Coordination normal.     Vitals:   03/13/23 1448  BP: 100/60  Pulse: 92  Temp: 98.4 F (36.9 C)  TempSrc: Oral  SpO2: 93%  Weight: 101 lb (45.8 kg)  Height: 5\' 3"  (1.6 m)    Assessment & Plan:

## 2023-03-13 NOTE — Assessment & Plan Note (Signed)
Some worsening today and if persistent she will let us know. Checking CMP and CBC today.

## 2023-03-13 NOTE — Assessment & Plan Note (Signed)
Checking CBC today. Taking oral B12.

## 2023-03-13 NOTE — Assessment & Plan Note (Signed)
With sleeping disturbance at this time. Rx remeron 15 mg qhs prn for sleep. She is wanting something that does not make her drowsy as she is the only driver in the household.

## 2023-03-13 NOTE — Assessment & Plan Note (Signed)
No signs of recurrence and continues to need close follow up of renal function checking CMP today.

## 2023-03-13 NOTE — Assessment & Plan Note (Signed)
With some increasing SOB and recent echo stable end of March. We talked about possibility of deconditioning causing more SOB. No signs of fluid overload on exam today. Weight is down 2 pounds since January. She is working on protein intake naturally.

## 2023-03-13 NOTE — Assessment & Plan Note (Signed)
Has been taking oral B12 for 2-3 weeks now and checking B12 to ensure this is moving in right direction.

## 2023-03-13 NOTE — Assessment & Plan Note (Signed)
She has chronic changes which are stable today to her balance. No falls recently and able to safely operate a motor vehicle.

## 2023-03-13 NOTE — Assessment & Plan Note (Signed)
Using oxygen all the time and does have some worsening SOB today. She will check CBC and CMP to ensure no metabolic cause. Low B12 recently and she has started replacement.

## 2023-03-18 ENCOUNTER — Inpatient Hospital Stay: Payer: Medicare PPO

## 2023-03-18 ENCOUNTER — Telehealth: Payer: Self-pay | Admitting: Internal Medicine

## 2023-03-18 NOTE — Telephone Encounter (Signed)
    Please call patient to discuss lab results 

## 2023-03-19 NOTE — Telephone Encounter (Signed)
Called pt no answer can't leave VM due to mail box is full.Paula KitchenRaechel Chute

## 2023-03-23 NOTE — Telephone Encounter (Signed)
Never received call back from pt. Pt Seen by patient Paula Ward on 03/17/2023  5:12 PM../lmb

## 2023-03-24 ENCOUNTER — Other Ambulatory Visit: Payer: Self-pay | Admitting: Physician Assistant

## 2023-03-24 DIAGNOSIS — E538 Deficiency of other specified B group vitamins: Secondary | ICD-10-CM

## 2023-03-25 ENCOUNTER — Inpatient Hospital Stay: Payer: Medicare PPO | Attending: Physician Assistant

## 2023-03-25 ENCOUNTER — Other Ambulatory Visit: Payer: Self-pay

## 2023-03-25 ENCOUNTER — Inpatient Hospital Stay: Payer: Medicare PPO

## 2023-03-25 ENCOUNTER — Inpatient Hospital Stay: Payer: Medicare PPO | Admitting: Physician Assistant

## 2023-03-25 VITALS — BP 127/49 | HR 81 | Temp 98.4°F | Resp 18 | Wt 102.8 lb

## 2023-03-25 DIAGNOSIS — R0602 Shortness of breath: Secondary | ICD-10-CM | POA: Diagnosis not present

## 2023-03-25 DIAGNOSIS — D649 Anemia, unspecified: Secondary | ICD-10-CM | POA: Diagnosis not present

## 2023-03-25 DIAGNOSIS — N189 Chronic kidney disease, unspecified: Secondary | ICD-10-CM | POA: Insufficient documentation

## 2023-03-25 DIAGNOSIS — E538 Deficiency of other specified B group vitamins: Secondary | ICD-10-CM | POA: Diagnosis not present

## 2023-03-25 LAB — CBC WITH DIFFERENTIAL (CANCER CENTER ONLY)
Abs Immature Granulocytes: 0.02 10*3/uL (ref 0.00–0.07)
Basophils Absolute: 0 10*3/uL (ref 0.0–0.1)
Basophils Relative: 1 %
Eosinophils Absolute: 0.1 10*3/uL (ref 0.0–0.5)
Eosinophils Relative: 2 %
HCT: 31.9 % — ABNORMAL LOW (ref 36.0–46.0)
Hemoglobin: 10.3 g/dL — ABNORMAL LOW (ref 12.0–15.0)
Immature Granulocytes: 0 %
Lymphocytes Relative: 24 %
Lymphs Abs: 1.1 10*3/uL (ref 0.7–4.0)
MCH: 35 pg — ABNORMAL HIGH (ref 26.0–34.0)
MCHC: 32.3 g/dL (ref 30.0–36.0)
MCV: 108.5 fL — ABNORMAL HIGH (ref 80.0–100.0)
Monocytes Absolute: 0.3 10*3/uL (ref 0.1–1.0)
Monocytes Relative: 6 %
Neutro Abs: 3.2 10*3/uL (ref 1.7–7.7)
Neutrophils Relative %: 67 %
Platelet Count: 181 10*3/uL (ref 150–400)
RBC: 2.94 MIL/uL — ABNORMAL LOW (ref 3.87–5.11)
RDW: 12.8 % (ref 11.5–15.5)
WBC Count: 4.7 10*3/uL (ref 4.0–10.5)
nRBC: 0 % (ref 0.0–0.2)

## 2023-03-25 LAB — RETIC PANEL
Immature Retic Fract: 16.8 % — ABNORMAL HIGH (ref 2.3–15.9)
RBC.: 2.9 MIL/uL — ABNORMAL LOW (ref 3.87–5.11)
Retic Count, Absolute: 39.2 10*3/uL (ref 19.0–186.0)
Retic Ct Pct: 1.4 % (ref 0.4–3.1)
Reticulocyte Hemoglobin: 36.8 pg (ref >27.9)

## 2023-03-25 LAB — CMP (CANCER CENTER ONLY)
ALT: 10 U/L (ref 0–44)
AST: 23 U/L (ref 15–41)
Albumin: 4.3 g/dL (ref 3.5–5.0)
Alkaline Phosphatase: 61 U/L (ref 38–126)
Anion gap: 7 (ref 5–15)
BUN: 40 mg/dL — ABNORMAL HIGH (ref 8–23)
CO2: 32 mmol/L (ref 22–32)
Calcium: 9.6 mg/dL (ref 8.9–10.3)
Chloride: 99 mmol/L (ref 98–111)
Creatinine: 1.39 mg/dL — ABNORMAL HIGH (ref 0.44–1.00)
GFR, Estimated: 37 mL/min — ABNORMAL LOW (ref >60)
Glucose, Bld: 85 mg/dL (ref 70–99)
Potassium: 4.1 mmol/L (ref 3.5–5.1)
Sodium: 138 mmol/L (ref 135–145)
Total Bilirubin: 0.7 mg/dL (ref 0.3–1.2)
Total Protein: 6.9 g/dL (ref 6.5–8.1)

## 2023-03-25 LAB — VITAMIN B12: Vitamin B-12: 392 pg/mL (ref 180–914)

## 2023-03-25 NOTE — Progress Notes (Signed)
[facility] [phone]   [phone]  PROGRESS NOTE  Patient Care Team: [name], MD as PCP - General (Internal Medicine)  CHIEF COMPLAINTS/PURPOSE OF CONSULTATION:  Macrocytic anemia  Vitamin B12 deficiency  HISTORY OF PRESENTING ILLNESS:  On review of the previous records, there is evidence of chronic anemia for several years stable between 10-11. Most recent labs from 2/29/204 showed Hgb had declined to 9.8, MCV 100.3. No other cytopenias. Iron panel shows no evidence of iron deficiency.  She has received periodic iron infusions, most recently received IV feraheme 510 mg x 2 doses in October 2023. She was last given 2 units of PRBC in December 2023 after being hospitalized for fracture of left femur which required intramedullary nailing with ortho. Lastly, she receives retracrit 10,000 units every 4 weeks, last given on 12/30/2022.   INTERVAL HISTORY: Paula Ward 87 y.o. female returns for a follow up for macrocytic anemia.  She is accompanied by her husband for this visit.  She was last seen on 02/11/2023 to establish care. In the interim, she has started vitamin B12 sublingual supplementation.   On exam today, Paula Ward reports that her fatigue is unchanged. She is able to complete her ADLs but requires frequent resting. She denies any bruising or bleeding episodes. She is taking vitamin B12 supplementation 3-4 times a week. Her shortness of breath is unchanged and she continues on  2 L of supplemental oxygen only at night. She denies fevers, chills, sweats, chest pain or cough. She has no other complaints. Rest of the 10 ROS is below.   MEDICAL HISTORY:  Past Medical History:  Diagnosis Date   Anemia    Angiodysplasia of stomach and duodenum    Arthus phenomenon    AVM (arteriovenous malformation)    Blood transfusion without reported diagnosis    last 4 units 12-22-15, Iron infusion x2 last -01-07-16,01-14-16.   Breast cancer (HCC)  1989   Left   Candida esophagitis (HCC)    Cataract    Chronic kidney disease    Chronic mild renal insuffiency   CREST syndrome (HCC)    GERD (gastroesophageal reflux disease)    w/ HH   Interstitial lung disease (HCC)    Nodule of kidney    Pericardial effusion    PONV (postoperative nausea and vomiting)    Pulmonary embolus (HCC) 2003   Pulmonary hypertension (HCC)    followed by Dr Bland Span at [facility], now Dr. Penni Bombard visit end 2'17.   Rectal incontinence    Renal cell carcinoma (HCC)    Scleroderma (HCC)    Tubular adenoma of colon    Uterine polyp     SURGICAL HISTORY: Past Surgical History:  Procedure Laterality Date   APPENDECTOMY  1962   BREAST LUMPECTOMY  1989   left   CATARACT EXTRACTION, BILATERAL Bilateral 12/2013   COLONOSCOPY WITH PROPOFOL N/A 04/15/2021   Procedure: COLONOSCOPY WITH PROPOFOL;  Surgeon: Rachael Fee, MD;  Location: [facility];  Service: Endoscopy;  Laterality: N/A;   ENTEROSCOPY N/A 01/18/2016   Procedure: ENTEROSCOPY;  Surgeon: Napoleon Form, MD;  Location: [facility];  Service: Endoscopy;  Laterality: N/A;   ENTEROSCOPY N/A 05/24/2018   Procedure: ENTEROSCOPY;  Surgeon: Lynann Bologna, MD;  Location: [facility];  Service: Endoscopy;  Laterality: N/A;   ENTEROSCOPY N/A 03/29/2021   Procedure: ENTEROSCOPY;  Surgeon: Beverley Fiedler, MD;  Location: [facility];  Service: Gastroenterology;  Laterality: N/A;   ENTEROSCOPY N/A 04/13/2021  Procedure: ENTEROSCOPY;  Surgeon: Benancio Deeds, MD;  Location: [facility];  Service: Gastroenterology;  Laterality: N/A;   ESOPHAGOGASTRODUODENOSCOPY (EGD) WITH PROPOFOL N/A 12/21/2015   Procedure: ESOPHAGOGASTRODUODENOSCOPY (EGD) WITH PROPOFOL;  Surgeon: Hilarie Fredrickson, MD;  Location: [facility];  Service: Endoscopy;  Laterality: N/A;   HOT HEMOSTASIS N/A 05/24/2018   Procedure: HOT HEMOSTASIS (ARGON PLASMA COAGULATION/BICAP);  Surgeon: Lynann Bologna, MD;  Location: [facility];  Service:  Endoscopy;  Laterality: N/A;   HOT HEMOSTASIS N/A 03/29/2021   Procedure: HOT HEMOSTASIS (ARGON PLASMA COAGULATION/BICAP);  Surgeon: Beverley Fiedler, MD;  Location: [facility];  Service: Gastroenterology;  Laterality: N/A;   HOT HEMOSTASIS N/A 04/13/2021   Procedure: HOT HEMOSTASIS (ARGON PLASMA COAGULATION/BICAP);  Surgeon: Benancio Deeds, MD;  Location: [facility];  Service: Gastroenterology;  Laterality: N/A;   INTRAMEDULLARY (IM) NAIL INTERTROCHANTERIC Left 10/11/2022   Procedure: INTRAMEDULLARY (IM) NAIL INTERTROCHANTERIC;  Surgeon: Ollen Gross, MD;  Location: [facility];  Service: Orthopedics;  Laterality: Left;   IR GENERIC HISTORICAL  06/05/2016   IR RADIOLOGIST EVAL & MGMT 06/05/2016 [name], MD [facility] INTERV RAD   IVC Filter     KIDNEY SURGERY     left -"laser surgery by Dr. Fredia Sorrow- 5 yrs ago" no removal   RIGHT/LEFT HEART CATH AND CORONARY ANGIOGRAPHY N/A 04/25/2022   Procedure: RIGHT/LEFT HEART CATH AND CORONARY ANGIOGRAPHY;  Surgeon: Tonny Bollman, MD;  Location: [facility] INVASIVE CV LAB;  Service: Cardiovascular;  Laterality: N/A;   SCHLEROTHERAPY  05/24/2018   Procedure: Theresia Majors;  Surgeon: Lynann Bologna, MD;  Location: [facility];  Service: Endoscopy;;   SUBMUCOSAL TATTOO INJECTION  04/13/2021   Procedure: SUBMUCOSAL TATTOO INJECTION;  Surgeon: Benancio Deeds, MD;  Location: [facility];  Service: Gastroenterology;;   TONSILLECTOMY     TUBAL LIGATION      SOCIAL HISTORY: Social History   Socioeconomic History   Marital status: Married    Spouse name: Not on file   Number of children: 2   Years of education: Not on file   Highest education level: Not on file  Occupational History   Occupation: Retired  Tobacco Use   Smoking status: Never   Smokeless tobacco: Never  Vaping Use   Vaping Use: Never used  Substance and Sexual Activity   Alcohol use: No   Drug use: No   Sexual activity: Not Currently  Other Topics Concern   Not on file  Social  History Narrative   Married '611 son- '65, 1 daughter '63; 6 children (2 adopted)SO- SOBRetirement- doing well. Marriage in good health. Patient has never smoked. Alcohol use- noPt gets regular exercise   Social Determinants of Health   Financial Resource Strain: Low Risk  (04/02/2022)   Overall Financial Resource Strain (CARDIA)    Difficulty of Paying Living Expenses: Not hard at all  Food Insecurity: No Food Insecurity (02/11/2023)   Hunger Vital Sign    Worried About Running Out of Food in the Last Year: Never true    Ran Out of Food in the Last Year: Never true  Transportation Needs: No Transportation Needs (02/11/2023)   PRAPARE - Administrator, Civil Service (Medical): No    Lack of Transportation (Non-Medical): No  Physical Activity: Inactive (04/02/2022)   Exercise Vital Sign    Days of Exercise per Week: 0 days    Minutes of Exercise per Session: 0 min  Stress: No Stress Concern Present (04/02/2022)   [employer] of Occupational Health - Occupational Stress Questionnaire  Feeling of Stress : Not at all  Social Connections: Socially Integrated (04/02/2022)   Social Connection and Isolation Panel [NHANES]    Frequency of Communication with Friends and Family: More than three times a week    Frequency of Social Gatherings with Friends and Family: More than three times a week    Attends Religious Services: More than 4 times per year    Active Member of [employer] or Organizations: Yes    Attends Engineer, structural: More than 4 times per year    Marital Status: Married  Catering manager Violence: Not At Risk (02/11/2023)   Humiliation, Afraid, Rape, and Kick questionnaire    Fear of Current or Ex-Partner: No    Emotionally Abused: No    Physically Abused: No    Sexually Abused: No    FAMILY HISTORY: Family History  Problem Relation Age of Onset   Bladder Cancer Father    Diabetes Father    Prostate cancer Father    Alzheimer's disease Mother     Diabetes Sister    Lung cancer Sister         smoker   Esophageal cancer Paternal Uncle    Colon cancer Neg Hx     ALLERGIES:  is allergic to codeine, other, erythromycin, iodinated contrast media, lisinopril, mycophenolate mofetil, and sulfa antibiotics.  MEDICATIONS:  Current Outpatient Medications  Medication Sig Dispense Refill   acetaminophen (TYLENOL) 325 MG tablet Take 2 tablets (650 mg total) by mouth every 6 (six) hours as needed for moderate pain. 30 tablet 0   albuterol (VENTOLIN HFA) 108 (90 Base) MCG/ACT inhaler INHALE 2 PUFFS INTO THE LUNGS EVERY 6 HOURS AS NEEDED FOR WHEEZING OR SHORTNESS OF BREATH 6.7 g 3   citalopram (CELEXA) 10 MG tablet Take 1 tablet (10 mg total) by mouth daily. (Patient taking differently: Take 10 mg by mouth daily as needed (depression).) 30 tablet 3   Cyanocobalamin (VITAMIN B-12) 1000 MCG SUBL Place 1,000 mcg under the tongue. Taking twice a week     docusate sodium (COLACE) 100 MG capsule Take 1 capsule (100 mg total) by mouth 2 (two) times daily. 10 capsule 0   famotidine (PEPCID) 20 MG tablet Take 1 tablet (20 mg total) by mouth at bedtime. 30 tablet 11   fluticasone (FLONASE) 50 MCG/ACT nasal spray Place 1 spray into both nostrils daily as needed for allergies. (Patient taking differently: Place 1 spray into both nostrils in the morning and at bedtime.) 48 g 0   furosemide (LASIX) 20 MG tablet Take 1 tablet by mouth once daily 90 tablet 0   guaiFENesin (MUCINEX) 600 MG 12 hr tablet Take 300 mg by mouth daily.     ipratropium (ATROVENT) 0.03 % nasal spray Place 2 sprays into both nostrils every 12 (twelve) hours. 30 mL 12   latanoprost (XALATAN) 0.005 % ophthalmic solution Place 1 drop into both eyes at bedtime.     magic mouthwash SOLN Take 10 mLs by mouth 4 (four) times daily as needed for mouth pain. 15 mL 2   mirtazapine (REMERON) 15 MG tablet Take 1 tablet (15 mg total) by mouth at bedtime. 30 tablet 3   montelukast (SINGULAIR) 10 MG tablet  TAKE 1 TABLET BY MOUTH AT BEDTIME 90 tablet 0   Multiple Vitamin (MULTIVITAMIN) capsule Take 1 capsule by mouth daily.     mupirocin ointment (BACTROBAN) 2 % Apply 1 Application topically 2 (two) times daily as needed (dry skin).     ondansetron (ZOFRAN)  4 MG tablet Take 1 tablet (4 mg total) by mouth every 6 (six) hours as needed for nausea. 20 tablet 0   OPSUMIT 10 MG TABS Take 10 mg by mouth daily.     OXYGEN Inhale into the lungs. 2 LMP at night and upon exertion     pantoprazole (PROTONIX) 40 MG tablet Take 1 tablet (40 mg total) by mouth daily. 30 tablet 11   polyethylene glycol (MIRALAX / GLYCOLAX) 17 g packet Take 17 g by mouth daily. 14 each 0   spironolactone (ALDACTONE) 25 MG tablet Take 0.5 tablets (12.5 mg total) by mouth daily. 45 tablet 3   sucralfate (CARAFATE) 1 GM/10ML suspension TAKE 10 MLS BY MOUTH EVERY 6 HOURS AS NEEDED FOR REFLUX 420 mL 0   cyanocobalamin (VITAMIN B12) 1000 MCG tablet Take 1 tablet (1,000 mcg total) by mouth daily. (Patient not taking: Reported on 03/25/2023) 30 tablet 3   diltiazem (CARDIZEM SR) 60 MG 12 hr capsule Take 1 capsule (60 mg total) by mouth daily. (Patient not taking: Reported on 03/25/2023) 30 capsule 1   No current facility-administered medications for this visit.    REVIEW OF SYSTEMS:   Constitutional: ( - ) fevers, ( - )  chills , ( - ) night sweats Eyes: ( - ) blurriness of vision, ( - ) double vision, ( - ) watery eyes Ears, nose, mouth, throat, and face: ( - ) mucositis, ( - ) sore throat Respiratory: ( - ) cough, ( + ) dyspnea, ( - ) wheezes Cardiovascular: ( - ) palpitation, ( - ) chest discomfort, ( - ) lower extremity swelling Gastrointestinal:  ( - ) nausea, ( - ) heartburn, ( - ) change in bowel habits Skin: ( - ) abnormal skin rashes Lymphatics: ( - ) new lymphadenopathy, ( - ) easy bruising Neurological: ( - ) numbness, ( - ) tingling, ( - ) new weaknesses Behavioral/Psych: ( - ) mood change, ( - ) new changes  All other  systems were reviewed with the patient and are negative.  PHYSICAL EXAMINATION: ECOG PERFORMANCE STATUS: 1 - Symptomatic but completely ambulatory  Vitals:   03/25/23 1020  BP: (!) 127/49  Pulse: 81  Resp: 18  Temp: 98.4 F (36.9 C)  SpO2: 96%   Filed Weights   03/25/23 1020  Weight: 102 lb 12.8 oz (46.6 kg)    GENERAL: well appearing elderly female in NAD  SKIN: skin color, texture, turgor are normal, no rashes or significant lesions EYES: conjunctiva are pink and non-injected, sclera clear LUNGS: clear to auscultation and percussion with normal breathing effort HEART: regular rate & rhythm and no murmurs and no lower extremity edema Musculoskeletal: no cyanosis of digits and no clubbing  PSYCH: alert & oriented x 3, fluent speech NEURO: no focal motor/sensory deficits  LABORATORY DATA:  I have reviewed the data as listed    Latest Ref Rng & Units 03/25/2023    9:38 AM 03/13/2023    3:12 PM 02/11/2023   10:01 AM  CBC  WBC 4.0 - 10.5 K/uL 4.7  5.1  4.8   Hemoglobin 12.0 - 15.0 g/dL 66.4  40.3  47.4   Hematocrit 36.0 - 46.0 % 31.9  32.8  31.3   Platelets 150 - 400 K/uL 181  183.0  168        Latest Ref Rng & Units 03/25/2023    9:38 AM 03/13/2023    3:12 PM 02/11/2023   10:01 AM  CMP  Glucose 70 -  99 mg/dL 85  96  84   BUN 8 - 23 mg/dL 40  34  31   Creatinine 0.44 - 1.00 mg/dL 1.61  0.96  0.45   Sodium 135 - 145 mmol/L 138  138  139   Potassium 3.5 - 5.1 mmol/L 4.1  4.2  3.7   Chloride 98 - 111 mmol/L 99  99  100   CO2 22 - 32 mmol/L 32  31  33   Calcium 8.9 - 10.3 mg/dL 9.6  9.3  9.3   Total Protein 6.5 - 8.1 g/dL 6.9  6.8  6.6   Total Bilirubin 0.3 - 1.2 mg/dL 0.7  0.5  0.5   Alkaline Phos 38 - 126 U/L 61  60  63   AST 15 - 41 U/L 23  22  22    ALT 0 - 44 U/L 10  12  10     RADIOGRAPHIC STUDIES: I have personally reviewed the radiological images as listed and agreed with the findings in the report. No results found.  ASSESSMENT & PLAN Paula Ward is a 87  y.o. female who presents to the hematology clinic for evaluation of macrocytic anemia  #Macrocytic anemia: #Vitamin B12 deficiency: --Etiology unknown but chronic in nature. CKD is part of differential.  --Iron panel from 12/30/2022 reviewed without deficiency.  --Patient has retacrit 10,000 units q 4 weeks, last received 12/30/2022. --Workup from 02/12/2023 didn't reveal hemolysis, paraproteinemia, folate deficiency. There was evidence of vitamin B12 deficiency so patient is currently on vitamin B12 SL replacement.  --Labs today show persistent anemia with Hgb 10.3, MCV 108.5. Vitamin B12 level has improved from 273 to 392. MMA level pending. --Discussed role for bone marrow biopsy based on today's labs. If there is no evidence of vitamin B12 deficiency, we will proceed with bone marrow biopsy.  --RTC once workup is complete  Orders Placed This Encounter  Procedures   Retic Panel    Standing Status:   Future    Number of Occurrences:   1    Standing Expiration Date:   03/24/2024   Copper, serum    Standing Status:   Future    Standing Expiration Date:   03/24/2024    All questions were answered. The patient knows to call the clinic with any problems, questions or concerns.  I have spent a total of 30 minutes minutes of face-to-face and non-face-to-face time, preparing to see the patient, obtaining and/or reviewing separately obtained history, performing a medically appropriate examination, counseling and educating the patient, ordering tests/procedures,documenting clinical information in the electronic health record, and care coordination.   Georga Kaufmann, PA-C Department of Hematology/Oncology [facility] Phone: [phone]

## 2023-03-27 LAB — METHYLMALONIC ACID, SERUM: Methylmalonic Acid, Quantitative: 445 nmol/L — ABNORMAL HIGH (ref 0–378)

## 2023-03-30 ENCOUNTER — Telehealth: Payer: Self-pay | Admitting: *Deleted

## 2023-03-30 ENCOUNTER — Encounter: Payer: Self-pay | Admitting: *Deleted

## 2023-03-30 NOTE — Telephone Encounter (Signed)
See other note

## 2023-03-30 NOTE — Telephone Encounter (Signed)
-----   Message from Briant Cedar, PA-C sent at 03/30/2023 10:37 AM EDT ----- Selena Batten: Please notify patient that labs show persistent B12 deficiency.  Recommend to take B12 oral supplementation 1000 mcg once a day or switch to B12 injections. Based on patient's decision, please let Erie Noe know to keep or cancel the B12 injections that are scheduled.   Vanessa: Please see above and wait for Kim's response. Please schedule a labs/follow up with Dr. Leonides Schanz on 05/27/2023 before injection.   Thanks, Karena Addison    ----- Message ----- From: Leory Plowman, [facility] [place] Sent: 03/25/2023   9:53 AM EDT To: Briant Cedar, PA-C

## 2023-03-30 NOTE — Telephone Encounter (Signed)
Patient decided to proceed with oral B 12 instead of receiving injections.  Canceled injection appts.  Patient knows to call if her symptoms worsen.

## 2023-03-30 NOTE — Telephone Encounter (Signed)
Attempted to reach patient to discuss lab findings per Georga Kaufmann, PA.    Mobile number unreachable and no voicemail.  Left message on home line and sent a message via mychart to give our office a call.  Pending call back.

## 2023-03-30 NOTE — Telephone Encounter (Signed)
-----   Message from Briant Cedar, PA-C sent at 03/30/2023 10:37 AM EDT ----- Paula Ward: Please notify patient that labs show persistent B12 deficiency.  Recommend to take B12 oral supplementation 1000 mcg once a day or switch to B12 injections. Based on patient's decision, please let Erie Noe know to keep or cancel the B12 injections that are scheduled.   Vanessa: Please see above and wait for Kim's response. Please schedule a labs/follow up with Dr. Leonides Schanz on 05/27/2023 before injection.   Thanks, Karena Addison    ----- Message ----- From: Leory Plowman, [facility] [place] Sent: 03/25/2023   9:53 AM EDT To: Briant Cedar, PA-C

## 2023-04-02 DIAGNOSIS — I509 Heart failure, unspecified: Secondary | ICD-10-CM | POA: Diagnosis not present

## 2023-04-03 ENCOUNTER — Ambulatory Visit
Admission: RE | Admit: 2023-04-03 | Discharge: 2023-04-03 | Disposition: A | Discharge status: Home / Self Care | Payer: Medicare PPO | Source: Ambulatory Visit | Attending: Nurse Practitioner | Admitting: Nurse Practitioner

## 2023-04-03 ENCOUNTER — Ambulatory Visit: Payer: Medicare PPO | Admitting: Internal Medicine

## 2023-04-03 VITALS — BP 123/60 | HR 83 | Temp 97.9°F | Resp 16

## 2023-04-03 DIAGNOSIS — R319 Hematuria, unspecified: Secondary | ICD-10-CM | POA: Insufficient documentation

## 2023-04-03 LAB — POCT URINALYSIS DIP (MANUAL ENTRY)
Bilirubin, UA: NEGATIVE
Glucose, UA: NEGATIVE mg/dL
Ketones, POC UA: NEGATIVE mg/dL
Leukocytes, UA: NEGATIVE
Nitrite, UA: NEGATIVE
Protein Ur, POC: NEGATIVE mg/dL
Spec Grav, UA: 1.015 (ref 1.010–1.025)
Urobilinogen, UA: 0.2 E.U./dL
pH, UA: 6.5 (ref 5.0–8.0)

## 2023-04-03 NOTE — ED Provider Notes (Addendum)
[facility]    CSN: [id number] Arrival date & time: 04/03/23  1458      History   Chief Complaint Chief Complaint  Patient presents with   Flank Pain    HPI Paula Ward is a 87 y.o. female presents for possible UTI.  Patient reports 1 week of some suprapubic pressure and low back pain.  She is also had some fatigue but does report she has a B12 deficiency which she has not yet began treatment for.  She is concerned she may have a UTI causing her symptoms.  She does states she broke her left leg a few months ago and has been walking with a cane which she thinks is causing her low back pain.  She denies any dysuria, fevers, nausea/vomiting, flank pain, hematuria, vaginal discharge.  No numbness/tingling/weakness of her lower extremities, bowel or bladder incontinence, or saddle paresthesia.  Reports a remote history of UTIs.  No history of kidney stones.  She has been taking Tylenol OTC for her symptoms.  No other concerns at this time.   Flank Pain    Past Medical History:  Diagnosis Date   Anemia    Angiodysplasia of stomach and duodenum    Arthus phenomenon    AVM (arteriovenous malformation)    Blood transfusion without reported diagnosis    last 4 units 12-22-15, Iron infusion x2 last -01-07-16,01-14-16.   Breast cancer (HCC) 1989   Left   Candida esophagitis (HCC)    Cataract    Chronic kidney disease    Chronic mild renal insuffiency   CREST syndrome (HCC)    GERD (gastroesophageal reflux disease)    w/ HH   Interstitial lung disease (HCC)    Nodule of kidney    Pericardial effusion    PONV (postoperative nausea and vomiting)    Pulmonary embolus (HCC) 2003   Pulmonary hypertension (HCC)    followed by Dr Bland Span at [facility], now Dr. Penni Bombard visit end 2'17.   Rectal incontinence    Renal cell carcinoma (HCC)    Scleroderma (HCC)    Tubular adenoma of colon    Uterine polyp     Patient Active Problem List   Diagnosis Date Noted   Vitamin B12  deficiency 03/02/2023   Closed comminuted intertrochanteric fracture of left femur (HCC) 10/10/2022   Chronic respiratory failure with hypoxia (HCC) 10/10/2022   Adjustment disorder 10/02/2022   Moderate aortic stenosis 04/26/2022   Aortic stenosis, moderate 10/29/2021   Generalized weakness 03/29/2021   Chronic diastolic heart failure (HCC) 03/29/2021   AVM (arteriovenous malformation) of small bowel, acquired    Anemia, chronic disease 12/25/2020   Globus pharyngeus 08/19/2019   Dry mouth 02/15/2018   Allergic rhinitis 11/05/2017   Cerebellar 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 (HCC) 10/26/2017   Pericardial effusion 01/21/2016   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HCC)    Osteoarthritis 02/25/2015   History of pulmonary embolism 01/29/2013   CKD (chronic kidney disease) stage 3, GFR 30-59 ml/min (HCC) 09/01/2012   RECTAL INCONTINENCE 03/27/2009   Angiodysplasia of stomach and duodenum 08/22/2008   ADENOCARCINOMA, BREAST, LEFT 07/26/2007   Anemia, iron deficiency 07/26/2007   Essential hypertension 07/26/2007   Pulmonary hypertension (HCC) 07/26/2007   GASTROESOPHAGEAL REFLUX DISEASE 07/26/2007   SCLERODERMA 07/26/2007   ILD (interstitial lung disease) (HCC) 07/26/2007   PULMONARY EMBOLISM, HX OF 07/26/2007    Past Surgical History:  Procedure Laterality Date   APPENDECTOMY  1962   BREAST LUMPECTOMY  1989  left   CATARACT EXTRACTION, BILATERAL Bilateral 12/2013   COLONOSCOPY WITH PROPOFOL N/A 04/15/2021   Procedure: COLONOSCOPY WITH PROPOFOL;  Surgeon: Rachael Fee, MD;  Location: [facility];  Service: Endoscopy;  Laterality: N/A;   ENTEROSCOPY N/A 01/18/2016   Procedure: ENTEROSCOPY;  Surgeon: Napoleon Form, MD;  Location: [facility];  Service: Endoscopy;  Laterality: N/A;   ENTEROSCOPY N/A 05/24/2018   Procedure: ENTEROSCOPY;  Surgeon: Lynann Bologna, MD;  Location: [facility];  Service: Endoscopy;  Laterality: N/A;   ENTEROSCOPY N/A 03/29/2021   Procedure:  ENTEROSCOPY;  Surgeon: Beverley Fiedler, MD;  Location: [facility];  Service: Gastroenterology;  Laterality: N/A;   ENTEROSCOPY N/A 04/13/2021   Procedure: ENTEROSCOPY;  Surgeon: Benancio Deeds, MD;  Location: [facility];  Service: Gastroenterology;  Laterality: N/A;   ESOPHAGOGASTRODUODENOSCOPY (EGD) WITH PROPOFOL N/A 12/21/2015   Procedure: ESOPHAGOGASTRODUODENOSCOPY (EGD) WITH PROPOFOL;  Surgeon: Hilarie Fredrickson, MD;  Location: [facility];  Service: Endoscopy;  Laterality: N/A;   HOT HEMOSTASIS N/A 05/24/2018   Procedure: HOT HEMOSTASIS (ARGON PLASMA COAGULATION/BICAP);  Surgeon: Lynann Bologna, MD;  Location: [facility];  Service: Endoscopy;  Laterality: N/A;   HOT HEMOSTASIS N/A 03/29/2021   Procedure: HOT HEMOSTASIS (ARGON PLASMA COAGULATION/BICAP);  Surgeon: Beverley Fiedler, MD;  Location: [facility];  Service: Gastroenterology;  Laterality: N/A;   HOT HEMOSTASIS N/A 04/13/2021   Procedure: HOT HEMOSTASIS (ARGON PLASMA COAGULATION/BICAP);  Surgeon: Benancio Deeds, MD;  Location: [facility];  Service: Gastroenterology;  Laterality: N/A;   INTRAMEDULLARY (IM) NAIL INTERTROCHANTERIC Left 10/11/2022   Procedure: INTRAMEDULLARY (IM) NAIL INTERTROCHANTERIC;  Surgeon: Ollen Gross, MD;  Location: [facility];  Service: Orthopedics;  Laterality: Left;   IR GENERIC HISTORICAL  06/05/2016   IR RADIOLOGIST EVAL & MGMT 06/05/2016 [name], MD [facility] INTERV RAD   IVC Filter     KIDNEY SURGERY     left -"laser surgery by Dr. Fredia Sorrow- 5 yrs ago" no removal   RIGHT/LEFT HEART CATH AND CORONARY ANGIOGRAPHY N/A 04/25/2022   Procedure: RIGHT/LEFT HEART CATH AND CORONARY ANGIOGRAPHY;  Surgeon: Tonny Bollman, MD;  Location: [facility] INVASIVE CV LAB;  Service: Cardiovascular;  Laterality: N/A;   SCHLEROTHERAPY  05/24/2018   Procedure: Theresia Majors;  Surgeon: Lynann Bologna, MD;  Location: [facility];  Service: Endoscopy;;   SUBMUCOSAL TATTOO INJECTION  04/13/2021   Procedure: SUBMUCOSAL TATTOO INJECTION;   Surgeon: Benancio Deeds, MD;  Location: [facility];  Service: Gastroenterology;;   TONSILLECTOMY     TUBAL LIGATION      OB History   No obstetric history on file.      Home Medications    Prior to Admission medications   Medication Sig Start Date End Date Taking? Authorizing Provider  acetaminophen (TYLENOL) 325 MG tablet Take 2 tablets (650 mg total) by mouth every 6 (six) hours as needed for moderate pain. 07/18/22   Wallis Bamberg, PA-C  albuterol (VENTOLIN HFA) 108 (90 Base) MCG/ACT inhaler INHALE 2 PUFFS INTO THE LUNGS EVERY 6 HOURS AS NEEDED FOR WHEEZING OR SHORTNESS OF BREATH 03/13/23   [name], MD  citalopram (CELEXA) 10 MG tablet Take 1 tablet (10 mg total) by mouth daily. Patient taking differently: Take 10 mg by mouth daily as needed (depression). 09/29/22   [name], MD  Cyanocobalamin (VITAMIN B-12) 1000 MCG SUBL Place 1,000 mcg under the tongue. Taking twice a week    [provider]  docusate sodium (COLACE) 100 MG capsule Take 1 capsule (100 mg total) by mouth 2 (  two) times daily. 10/15/22   Edmisten, Kristie L, PA  famotidine (PEPCID) 20 MG tablet Take 1 tablet (20 mg total) by mouth at bedtime. 02/10/23   Napoleon Form, MD  fluticasone (FLONASE) 50 MCG/ACT nasal spray Place 1 spray into both nostrils daily as needed for allergies. Patient taking differently: Place 1 spray into both nostrils in the morning and at bedtime. 05/15/22   [name], MD  furosemide (LASIX) 20 MG tablet Take 1 tablet by mouth once daily 01/27/23   [name], MD  guaiFENesin (MUCINEX) 600 MG 12 hr tablet Take 300 mg by mouth daily.    [provider]  ipratropium (ATROVENT) 0.03 % nasal spray Place 2 sprays into both nostrils every 12 (twelve) hours. 05/12/22   Martina Sinner, MD  latanoprost (XALATAN) 0.005 % ophthalmic solution Place 1 drop into both eyes at bedtime. 09/23/22   [provider]  magic mouthwash  SOLN Take 10 mLs by mouth 4 (four) times daily as needed for mouth pain. 03/22/22   Burnadette Pop, MD  mirtazapine (REMERON) 15 MG tablet Take 1 tablet (15 mg total) by mouth at bedtime. 03/13/23   [name], MD  montelukast (SINGULAIR) 10 MG tablet TAKE 1 TABLET BY MOUTH AT BEDTIME 02/18/23   [name], MD  Multiple Vitamin (MULTIVITAMIN) capsule Take 1 capsule by mouth daily.    [provider]  mupirocin ointment (BACTROBAN) 2 % Apply 1 Application topically 2 (two) times daily as needed (dry skin).    [provider]  ondansetron (ZOFRAN) 4 MG tablet Take 1 tablet (4 mg total) by mouth every 6 (six) hours as needed for nausea. 10/15/22   Edmisten, Kristie L, PA  OPSUMIT 10 MG TABS Take 10 mg by mouth daily. 08/16/15   [provider]  OXYGEN Inhale into the lungs. 2 LMP at night and upon exertion    [provider]  pantoprazole (PROTONIX) 40 MG tablet Take 1 tablet (40 mg total) by mouth daily. 02/10/23   Napoleon Form, MD  polyethylene glycol (MIRALAX / GLYCOLAX) 17 g packet Take 17 g by mouth daily. 10/15/22   Edmisten, Lyn Hollingshead, PA  spironolactone (ALDACTONE) 25 MG tablet Take 0.5 tablets (12.5 mg total) by mouth daily. 01/30/23   [name], MD  sucralfate (CARAFATE) 1 GM/10ML suspension TAKE 10 MLS BY MOUTH EVERY 6 HOURS AS NEEDED FOR REFLUX 02/11/23   Napoleon Form, MD    Family History Family History  Problem Relation Age of Onset   Bladder Cancer Father    Diabetes Father    Prostate cancer Father    Alzheimer's disease Mother    Diabetes Sister    Lung cancer Sister         smoker   Esophageal cancer Paternal Uncle    Colon cancer Neg Hx     Social History Social History   Tobacco Use   Smoking status: Never   Smokeless tobacco: Never  Vaping Use   Vaping Use: Never used  Substance Use Topics   Alcohol use: No   Drug use: No     Allergies   Codeine, Other, Erythromycin, Iodinated  contrast media, Lisinopril, Mycophenolate mofetil, and Sulfa antibiotics   Review of Systems Review of Systems  Genitourinary:        Suprapubic pressure  Musculoskeletal:  Positive for back pain.     Physical Exam Triage Vital Signs ED Triage Vitals  Enc Vitals Group  BP 04/03/23 1522 123/60     Pulse Rate 04/03/23 1522 83     Resp 04/03/23 1522 16     Temp 04/03/23 1522 97.9 F (36.6 C)     Temp src --      SpO2 04/03/23 1522 92 %     Weight --      Height --      Head Circumference --      Peak Flow --      Pain Score 04/03/23 1531 3     Pain Loc --      Pain Edu? --      Excl. in GC? --    No data found.  Updated Vital Signs BP 123/60 (BP Location: Right Arm)   Pulse 83   Temp 97.9 F (36.6 C)   Resp 16   SpO2 92%   Visual Acuity Right Eye Distance:   Left Eye Distance:   Bilateral Distance:    Right Eye Near:   Left Eye Near:    Bilateral Near:     Physical Exam Vitals and nursing note reviewed.  Constitutional:      Appearance: Normal appearance.  HENT:     Head: Normocephalic and atraumatic.  Eyes:     Pupils: Pupils are equal, round, and reactive to light.  Cardiovascular:     Rate and Rhythm: Normal rate.  Pulmonary:     Effort: Pulmonary effort is normal.  Abdominal:     General: Bowel sounds are normal.     Palpations: Abdomen is soft.     Tenderness: There is no abdominal tenderness. There is no right CVA tenderness, left CVA tenderness or guarding.  Musculoskeletal:     Lumbar back: Tenderness present. No swelling, edema, deformity, signs of trauma, lacerations, spasms or bony tenderness. Negative right straight leg raise test and negative left straight leg raise test.       Back:  Skin:    General: Skin is warm and dry.  Neurological:     General: No focal deficit present.     Mental Status: She is alert and oriented to person, place, and time.  Psychiatric:        Mood and Affect: Mood normal.        Behavior: Behavior  normal.      UC Treatments / Results  Labs (all labs ordered are listed, but only abnormal results are displayed) Labs Reviewed  POCT URINALYSIS DIP (MANUAL ENTRY) - Abnormal; Notable for the following components:      Result Value   Blood, UA trace-intact (*)    All other components within normal limits  URINE CULTURE   CMP (Cancer Center only) Order: [id number] Status: Final result     Visible to patient: Yes (seen)     Next appt: 05/27/2023 at 02:30 PM in Oncology ([facility])     Dx: Vitamin B12 deficiency   0 Result Notes     2 Follow-up Encounters          Component Ref Range & Units 9 d ago (03/25/23) 3 wk ago (03/13/23) 1 mo ago (02/11/23) 1 mo ago (02/10/23) 3 mo ago (12/18/22) 4 mo ago (11/11/22) 5 mo ago (10/15/22)  Sodium 135 - 145 mmol/L 138 138 R 139 136 R 139 R 140 R 140  Potassium 3.5 - 5.1 mmol/L 4.1 4.2 R 3.7 4.0 R 4.0 R 4.1 R 4.3  Chloride 98 - 111 mmol/L 99 99 R 100 98 R 99 R 100 R  109  CO2 22 - 32 mmol/L 32 31 R 33 High  31 R 31 R 31 R 27  Glucose, Bld 70 - 99 mg/dL 85 96 84 CM 161 High  89 88 97 CM  Comment: Glucose reference range applies only to samples taken after fasting for at least 8 hours.  BUN 8 - 23 mg/dL 40 High  34 High  R 31 High  33 High  R 39 High  R 45 High  R 38 High   Creatinine 0.44 - 1.00 mg/dL 0.96 High  0.45 High  R 1.21 High  1.30 High  R 1.18 R 1.19 R 1.11 High   Calcium 8.9 - 10.3 mg/dL 9.6 9.3 R 9.3 9.0 R 9.4 R 9.5 R 8.5 Low   Total Protein 6.5 - 8.1 g/dL 6.9 6.8 R 6.6 6.5 R 6.6 R 6.7 R   Albumin 3.5 - 5.0 g/dL 4.3 4.3 R 4.1 4.1 R 3.9 R 3.8 R   AST 15 - 41 U/L 23 22 R 22 23 R 21 R 17 R   ALT 0 - 44 U/L 10 12 R 10 11 R 9 R 7 R   Alkaline Phosphatase 38 - 126 U/L 61 60 R 63 65 R 79 R 105 R   Total Bilirubin 0.3 - 1.2 mg/dL 0.7 0.5 R 0.5 0.5 R 0.5 R 0.6 R   GFR, Estimated >60 mL/min 37 Low   44 Low  CM      Comment: (NOTE) Calculated using the CKD-EPI Creatinine Equation (2021)  Anion gap 5 - 15 7  6  CM     4 Low  CM  Comment: Performed at [facility], [address], [place], [place] [address]  Resulting Agency [facility] [place] [facility] [place] [facility] [place] [facility]                  EKG   Radiology No results found.  Procedures Procedures (including critical care time)  Medications Ordered in UC Medications - No data to display  Initial Impression / Assessment and Plan / UC Course  I have reviewed the triage vital signs and the nursing notes.  Pertinent labs & imaging results that were available during my care of the patient were reviewed by me and considered in my medical decision making (see chart for details).     Reviewed exam and symptoms with patient.  UA negative for UTI.  Trace blood, will culture Discussed low back pain likely musculoskeletal cause and advised to continue Tylenol.  Avoid NSAIDs as patient does have a history of chronic kidney disease Advised to stay hydrated and follow-up with PCP if symptoms or not improving ER precautions reviewed and patient verbalized understanding Final Clinical Impressions(s) / UC Diagnoses   Final diagnoses:  Hematuria, unspecified type     Discharge Instructions      The clinic will contact you with results of the urine culture done today if positive Maintain your hydration and follow-up with your PCP if your symptoms or not improving Please go to the emergency room if you develop any worsening symptoms    ED Prescriptions   None    PDMP not reviewed this encounter.   Radford Pax, NP 04/03/23 1606    Radford Pax, NP 04/03/23 1606

## 2023-04-03 NOTE — ED Triage Notes (Signed)
Pt presents to UC w/ c/o lower back pain, lower abd pain, and dysuria x1 week. Denies hematuria

## 2023-04-03 NOTE — Discharge Instructions (Signed)
The clinic will contact you with results of the urine culture done today if positive Maintain your hydration and follow-up with your PCP if your symptoms or not improving Please go to the emergency room if you develop any worsening symptoms

## 2023-04-05 LAB — URINE CULTURE: Culture: <10000 — AB

## 2023-04-16 ENCOUNTER — Telehealth: Payer: Self-pay | Admitting: Internal Medicine

## 2023-04-16 ENCOUNTER — Other Ambulatory Visit (INDEPENDENT_AMBULATORY_CARE_PROVIDER_SITE_OTHER): Payer: Medicare PPO

## 2023-04-16 DIAGNOSIS — I272 Pulmonary hypertension, unspecified: Secondary | ICD-10-CM

## 2023-04-16 DIAGNOSIS — E538 Deficiency of other specified B group vitamins: Secondary | ICD-10-CM

## 2023-04-16 LAB — COMPREHENSIVE METABOLIC PANEL
ALT: 10 U/L (ref 0–35)
AST: 22 U/L (ref 0–37)
Albumin: 4.2 g/dL (ref 3.5–5.2)
Alkaline Phosphatase: 68 U/L (ref 39–117)
BUN: 28 mg/dL — ABNORMAL HIGH (ref 6–23)
CO2: 31 mEq/L (ref 19–32)
Calcium: 9.6 mg/dL (ref 8.4–10.5)
Chloride: 97 mEq/L (ref 96–112)
Creatinine, Ser: 1.16 mg/dL (ref 0.40–1.20)
GFR: 43.25 mL/min — ABNORMAL LOW (ref >60.00)
Glucose, Bld: 97 mg/dL (ref 70–99)
Potassium: 4.4 mEq/L (ref 3.5–5.1)
Sodium: 135 mEq/L (ref 135–145)
Total Bilirubin: 0.7 mg/dL (ref 0.2–1.2)
Total Protein: 6.7 g/dL (ref 6.0–8.3)

## 2023-04-16 LAB — CBC
HCT: 31.7 % — ABNORMAL LOW (ref 36.0–46.0)
Hemoglobin: 10.5 g/dL — ABNORMAL LOW (ref 12.0–15.0)
MCHC: 33.1 g/dL (ref 30.0–36.0)
MCV: 106.6 fl — ABNORMAL HIGH (ref 78.0–100.0)
Platelets: 175 10*3/uL (ref 150.0–400.0)
RBC: 2.97 Mil/uL — ABNORMAL LOW (ref 3.87–5.11)
RDW: 13.1 % (ref 11.5–15.5)
WBC: 5.3 10*3/uL (ref 4.0–10.5)

## 2023-04-16 NOTE — Telephone Encounter (Signed)
Patient came in to have her standing order labs done. She asked if we would be able to add on a B12 level as well?  She has already had the blood drawn but thought we might could add the B12?  Please advise.

## 2023-04-17 ENCOUNTER — Other Ambulatory Visit (INDEPENDENT_AMBULATORY_CARE_PROVIDER_SITE_OTHER): Payer: Medicare PPO

## 2023-04-17 ENCOUNTER — Telehealth: Payer: Self-pay

## 2023-04-17 DIAGNOSIS — E538 Deficiency of other specified B group vitamins: Secondary | ICD-10-CM

## 2023-04-17 LAB — VITAMIN B12: Vitamin B-12: 554 pg/mL (ref 211–911)

## 2023-04-17 NOTE — Telephone Encounter (Signed)
Patient Requested add on for Vitamin B12 to the Lab.  [7:59 AM] [name], Tammy goodmorning, are you working with Dr.Crawford? [7:59 AM] [name] Good Morning. Yes I do work with her but she is currently out of office until 7/15. [8:00 AM] [name], Tammy yes, there is a pt of hers that wants a b12 added to the labs she had done yesterday. could you put that order in so I can release and send over to the main lab so they can add it? [8:01 AM] [name], Paula Ward Mar 17, 1936 [8:02 AM] [name] Yes I will  [8:03 AM] [name], Tammy thank you [8:08 AM] [name] Done.

## 2023-04-17 NOTE — Addendum Note (Signed)
Addended by: Larene Pickett D on: 04/17/2023 09:37 AM   Modules accepted: Orders

## 2023-04-20 DIAGNOSIS — R102 Pelvic and perineal pain: Secondary | ICD-10-CM | POA: Diagnosis not present

## 2023-04-22 ENCOUNTER — Inpatient Hospital Stay: Payer: Medicare PPO

## 2023-04-25 ENCOUNTER — Other Ambulatory Visit: Payer: Self-pay | Admitting: Internal Medicine

## 2023-04-28 DIAGNOSIS — R102 Pelvic and perineal pain: Secondary | ICD-10-CM | POA: Diagnosis not present

## 2023-04-29 ENCOUNTER — Other Ambulatory Visit: Payer: Self-pay | Admitting: Internal Medicine

## 2023-05-02 DIAGNOSIS — I509 Heart failure, unspecified: Secondary | ICD-10-CM | POA: Diagnosis not present

## 2023-05-05 DIAGNOSIS — I739 Peripheral vascular disease, unspecified: Secondary | ICD-10-CM | POA: Diagnosis not present

## 2023-05-05 DIAGNOSIS — L84 Corns and callosities: Secondary | ICD-10-CM | POA: Diagnosis not present

## 2023-05-05 DIAGNOSIS — L603 Nail dystrophy: Secondary | ICD-10-CM | POA: Diagnosis not present

## 2023-05-08 ENCOUNTER — Telehealth: Payer: Self-pay | Admitting: Pulmonary Disease

## 2023-05-08 NOTE — Telephone Encounter (Signed)
Patient states having symptom of shortness of breath. Pharmacy is [facility]. Patient phone number is [phone].

## 2023-05-08 NOTE — Telephone Encounter (Signed)
Called and spoke with patient.  Patient stated she has pulmonary hypertension and her issues may be related to progression of that.  Patient is complaining of having increased sob. Patient stated she is using O2 at night as prescribed, but she sleeps for 2 hours and wakes up sob. Patient stated she has to sit on the side of the bed for 15-20 minutes before she is able to lay back down to sleep.  Patient stated 2 hours later she wakes again sob.  Patient stated she is more sob with exertion and heat has made it worse.  Patient does not use O2 during the day. Patient has scheduled follow up with Dr. Francine Graven 06/26/23, but feels she needs to be see sooner. Patient is scheduled 05/13/23 at 2:30pm with Katie,NP.  Nothing further at this time.

## 2023-05-13 ENCOUNTER — Encounter: Payer: Self-pay | Admitting: Nurse Practitioner

## 2023-05-13 ENCOUNTER — Ambulatory Visit (INDEPENDENT_AMBULATORY_CARE_PROVIDER_SITE_OTHER): Payer: Medicare PPO

## 2023-05-13 ENCOUNTER — Ambulatory Visit: Payer: Medicare PPO | Admitting: Nurse Practitioner

## 2023-05-13 VITALS — BP 120/52 | HR 83 | Ht 63.0 in | Wt 106.8 lb

## 2023-05-13 DIAGNOSIS — I272 Pulmonary hypertension, unspecified: Secondary | ICD-10-CM | POA: Diagnosis not present

## 2023-05-13 DIAGNOSIS — R0609 Other forms of dyspnea: Secondary | ICD-10-CM | POA: Diagnosis not present

## 2023-05-13 DIAGNOSIS — R918 Other nonspecific abnormal finding of lung field: Secondary | ICD-10-CM | POA: Diagnosis not present

## 2023-05-13 DIAGNOSIS — J984 Other disorders of lung: Secondary | ICD-10-CM | POA: Diagnosis not present

## 2023-05-13 DIAGNOSIS — J849 Interstitial pulmonary disease, unspecified: Secondary | ICD-10-CM

## 2023-05-13 DIAGNOSIS — R600 Localized edema: Secondary | ICD-10-CM | POA: Diagnosis not present

## 2023-05-13 DIAGNOSIS — I5032 Chronic diastolic (congestive) heart failure: Secondary | ICD-10-CM

## 2023-05-13 DIAGNOSIS — J9611 Chronic respiratory failure with hypoxia: Secondary | ICD-10-CM

## 2023-05-13 DIAGNOSIS — I517 Cardiomegaly: Secondary | ICD-10-CM | POA: Diagnosis not present

## 2023-05-13 DIAGNOSIS — R0602 Shortness of breath: Secondary | ICD-10-CM | POA: Diagnosis not present

## 2023-05-13 LAB — BASIC METABOLIC PANEL
BUN: 37 mg/dL — ABNORMAL HIGH (ref 6–23)
CO2: 30 mEq/L (ref 19–32)
Calcium: 9.7 mg/dL (ref 8.4–10.5)
Chloride: 95 mEq/L — ABNORMAL LOW (ref 96–112)
Creatinine, Ser: 1.29 mg/dL — ABNORMAL HIGH (ref 0.40–1.20)
GFR: 38.05 mL/min — ABNORMAL LOW (ref >60.00)
Glucose, Bld: 89 mg/dL (ref 70–99)
Potassium: 4.9 mEq/L (ref 3.5–5.1)
Sodium: 134 mEq/L — ABNORMAL LOW (ref 135–145)

## 2023-05-13 LAB — CBC WITH DIFFERENTIAL/PLATELET
Basophils Absolute: 0 10*3/uL (ref 0.0–0.1)
Basophils Relative: 0.7 % (ref 0.0–3.0)
Eosinophils Absolute: 0.1 10*3/uL (ref 0.0–0.7)
Eosinophils Relative: 1.2 % (ref 0.0–5.0)
HCT: 29.9 % — ABNORMAL LOW (ref 36.0–46.0)
Hemoglobin: 9.7 g/dL — ABNORMAL LOW (ref 12.0–15.0)
Lymphocytes Relative: 14.5 % (ref 12.0–46.0)
Lymphs Abs: 0.9 10*3/uL (ref 0.7–4.0)
MCHC: 32.6 g/dL (ref 30.0–36.0)
MCV: 107.2 fl — ABNORMAL HIGH (ref 78.0–100.0)
Monocytes Absolute: 0.3 10*3/uL (ref 0.1–1.0)
Monocytes Relative: 5.6 % (ref 3.0–12.0)
Neutro Abs: 4.7 10*3/uL (ref 1.4–7.7)
Neutrophils Relative %: 78 % — ABNORMAL HIGH (ref 43.0–77.0)
Platelets: 191 10*3/uL (ref 150.0–400.0)
RBC: 2.78 Mil/uL — ABNORMAL LOW (ref 3.87–5.11)
RDW: 13.1 % (ref 11.5–15.5)
WBC: 6.1 10*3/uL (ref 4.0–10.5)

## 2023-05-13 LAB — BRAIN NATRIURETIC PEPTIDE: Pro B Natriuretic peptide (BNP): 1750 pg/mL — ABNORMAL HIGH (ref 0.0–100.0)

## 2023-05-13 NOTE — Assessment & Plan Note (Signed)
See above. May consider repeat echo; although, last was stable from 12/2022. Follow up with cardiology as scheduled

## 2023-05-13 NOTE — Assessment & Plan Note (Signed)
Volume overload on exam, which would explain her increased dyspnea. She had some improvement in symptoms with increased dosing of spironolactone. Will have her double her lasix to 40 mg daily for the next 5 days. Check BNP and BMET today. CXR to rule out superimposed infection and assess for evidence of pulm edema. Close follow up and strict ED precautions. Walk test without desaturations on room air. Will also obtain CBC to rule out worsening anemia as cause for dyspnea.   Patient Instructions  Continue Albuterol inhaler 2 puffs every 6 hours as needed for shortness of breath or wheezing. Notify if symptoms persist despite rescue inhaler/neb use. Continue opsumit 10 mg daily Continue spironolactone 25 mg (1 tab) daily in AM Continue singulair 1 tab daily  -Increase lasix to 2 tablets (40 mg) daily for the next 5 days. Take in AM. This will make you have to urinate more frequently. Monitor your weights at home. Notify if you have increased weight of 2-3 lb overnight of 5 lb in a week  I may add on potassium depending on your labs today but will call you regarding this  Labs and chest x ray today  Follow up in one week with Dr. Francine Graven or Philis Nettle. Ok to double book KC. If symptoms do not improve or worsen, please contact office for sooner follow up or seek emergency care.

## 2023-05-13 NOTE — Progress Notes (Signed)
@Patient  ID: Paula Ward, female    DOB: August 19, 1936, 87 y.o.   MRN: [id number]  Chief Complaint  Patient presents with   Follow-up    Ongoing for 2 weeks. Sob during exertion. Pt is on oxygen at night     Referring provider: [name], *  HPI: 87 year old female, never smoker followed for CT ILD and pulmonary hypertension. She is a patient of Dr. Lanora Manis and last seen in office 09/05/2023. Past medical history significant for scleroderma, AVMs of small bowel, chronic blood loss anemia, CKD, pericardial effusion, CHF, AS, HTN, GERD.  TEST/EVENTS:  04/12/2019 HRCT chest: probable UIP pattern fibrosis, slightly progressed since 2012. Small pulmonary nodules measuring up to 6 mm. Large pericardial effusion 03/01/2020 PFT: FVC 68, FEV1 65, ratio 73, DLCO 37 02/28/2021 PFT: FVC 59, FEV1 57, ratio 72, DLCO 41 02/03/2022 PFT: FVC 56, FEV1 64, ratio 84, DLCOcor 63 04/23/2022 CT chest: marked interval improvement in areas of ground glass. Dependent airspace disease in lower lobes largely resolved. Subpleural reticulation b/l, similar. Similar moderate to large pericardial effusion. Enlargement of the pulmonary outflow tract/main pulmonary arteries suggests PH. 7 mm RUL nodule, stable. Similar left pleural effusion. Atherosclerosis.  04/25/2022 R/LHC: no obstructive disease. Mild PH with PA pressure 51/15, mean 28 mmHg, PVR 2 Wood units. Moderate AS.  01/15/2023 echo: EF 60-65%. GIDD. RV size and function nl. Moderately elevated PASP. LA severely dilated. Large pericardial effusio. Mild to moderate MS. Tricuspid regurgitation, mild to moderate. AR moderate.   09/05/2023: OV with Dr. Francine Graven. Followed by [facility] for ILD and PH related to her scleroderma. Previously on supplemental oxygen in years past but based on 6 MWT at [facility], she was able to be weaned off of this. Only on it at night. Doing find since last visit. Nasal drainage much improved on nasal sprays. Continues on opsumit  10 mg daily. Appears to have progressive fibrotic changes on CT imaging. She has a progressive decline in PFTs; although, testing before visit was stable. Antifibrotic therapy previously discussed at [facility] and not initiated due to side effect profile.   05/13/2023: Today - acute Patient presents today for acute visit. She's had more trouble with her breathing over the past few weeks. She feels like she gets out of breath just walking around her house. She was also experiencing some chest pressure but she restarted her diltiazem (she had self discontinued this 6 months ago) and increased her spironolactone to 25 mg over this past weekend and the pressure resolved. She has an occasional cough, which doesn't seem to be much changed from baseline. Gets up a small amount of white phlegm from time to time. Not noticing much wheezing. She's had more swelling in her legs. She does feel like this has improved some since she increased the spironolactone but still not back to normal. She finds it harder to lay flat at night. Wakes up short winded sometimes. She denies any fevers, chills, hemoptysis, chest pain, palpitations, dizziness, calf pain.  She is staking lasix 20 mg daily. She is on 10 mg opsumit daily. She has used her rescue inhaler, which she feels does help some. Her weight is up 5 lb over the last four weeks or so.   Allergies  Allergen Reactions   Codeine Nausea Only    Hallucinations, too   Other Nausea And Vomiting    "-mycin" antibiotics.   Also cause hallucinations.   Erythromycin Nausea And Vomiting   Iodinated Contrast Media Other (  See Comments)    Renal issues   Lisinopril     Pt doesn't remember    Mycophenolate Mofetil Nausea Only   Sulfa Antibiotics Other (See Comments)    unknown    Immunization History  Administered Date(s) Administered   Fluad Quad(high Dose 65+) 07/24/2020   Influenza, High Dose Seasonal PF 07/07/2017, 06/21/2019   Influenza-Unspecified 07/15/2013,  02/16/2015, 07/19/2015, 07/18/2016, 08/20/2018, 06/24/2021, 08/03/2022   Moderna Sars-Covid-2 Vaccination 11/01/2019, 11/29/2019, 09/14/2020, 02/16/2021   Pneumococcal Conjugate-13 01/30/2014   Pneumococcal Polysaccharide-23 08/30/2007   Respiratory Syncytial Virus Vaccine,Recomb Aduvanted(Arexvy) 09/24/2022   Tdap 06/26/2011, 06/07/2018   Zoster, Live 08/15/2014    Past Medical History:  Diagnosis Date   Anemia    Angiodysplasia of stomach and duodenum    Arthus phenomenon    AVM (arteriovenous malformation)    Blood transfusion without reported diagnosis    last 4 units 12-22-15, Iron infusion x2 last -01-07-16,01-14-16.   Breast cancer (HCC) 1989   Left   Candida esophagitis (HCC)    Cataract    Chronic kidney disease    Chronic mild renal insuffiency   CREST syndrome (HCC)    GERD (gastroesophageal reflux disease)    w/ HH   Interstitial lung disease (HCC)    Nodule of kidney    Pericardial effusion    PONV (postoperative nausea and vomiting)    Pulmonary embolus (HCC) 2003   Pulmonary hypertension (HCC)    followed by Dr Bland Span at [facility], now Dr. Penni Bombard visit end 2'17.   Rectal incontinence    Renal cell carcinoma (HCC)    Scleroderma (HCC)    Tubular adenoma of colon    Uterine polyp     Tobacco History: Social History   Tobacco Use  Smoking Status Never  Smokeless Tobacco Never   Counseling given: Not Answered   Outpatient Medications Prior to Visit  Medication Sig Dispense Refill   acetaminophen (TYLENOL) 325 MG tablet Take 2 tablets (650 mg total) by mouth every 6 (six) hours as needed for moderate pain. 30 tablet 0   albuterol (VENTOLIN HFA) 108 (90 Base) MCG/ACT inhaler INHALE 2 PUFFS INTO THE LUNGS EVERY 6 HOURS AS NEEDED FOR WHEEZING OR SHORTNESS OF BREATH 6.7 g 3   citalopram (CELEXA) 10 MG tablet Take 1 tablet (10 mg total) by mouth daily. (Patient taking differently: Take 10 mg by mouth daily as needed (depression).) 30 tablet 3    Cyanocobalamin (VITAMIN B-12) 1000 MCG SUBL Place 1,000 mcg under the tongue. Taking twice a week     docusate sodium (COLACE) 100 MG capsule Take 1 capsule (100 mg total) by mouth 2 (two) times daily. 10 capsule 0   famotidine (PEPCID) 20 MG tablet Take 1 tablet (20 mg total) by mouth at bedtime. 30 tablet 11   fluticasone (FLONASE) 50 MCG/ACT nasal spray Place 1 spray into both nostrils daily as needed for allergies. (Patient taking differently: Place 1 spray into both nostrils in the morning and at bedtime.) 48 g 0   furosemide (LASIX) 20 MG tablet Take 1 tablet by mouth once daily 90 tablet 0   guaiFENesin (MUCINEX) 600 MG 12 hr tablet Take 300 mg by mouth daily.     ipratropium (ATROVENT) 0.03 % nasal spray Place 2 sprays into both nostrils every 12 (twelve) hours. 30 mL 12   latanoprost (XALATAN) 0.005 % ophthalmic solution Place 1 drop into both eyes at bedtime.     magic mouthwash SOLN Take 10 mLs by mouth 4 (four)  times daily as needed for mouth pain. 15 mL 2   mirtazapine (REMERON) 15 MG tablet Take 1 tablet (15 mg total) by mouth at bedtime. 30 tablet 3   montelukast (SINGULAIR) 10 MG tablet TAKE 1 TABLET BY MOUTH AT BEDTIME 90 tablet 0   Multiple Vitamin (MULTIVITAMIN) capsule Take 1 capsule by mouth daily.     mupirocin ointment (BACTROBAN) 2 % Apply 1 Application topically 2 (two) times daily as needed (dry skin).     ondansetron (ZOFRAN) 4 MG tablet Take 1 tablet (4 mg total) by mouth every 6 (six) hours as needed for nausea. 20 tablet 0   OPSUMIT 10 MG TABS Take 10 mg by mouth daily.     OXYGEN Inhale into the lungs. 2 LMP at night and upon exertion     pantoprazole (PROTONIX) 40 MG tablet Take 1 tablet (40 mg total) by mouth daily. 30 tablet 11   polyethylene glycol (MIRALAX / GLYCOLAX) 17 g packet Take 17 g by mouth daily. 14 each 0   spironolactone (ALDACTONE) 25 MG tablet Take 0.5 tablets (12.5 mg total) by mouth daily. 45 tablet 3   sucralfate (CARAFATE) 1 GM/10ML suspension  TAKE 10 MLS BY MOUTH EVERY 6 HOURS AS NEEDED FOR REFLUX 420 mL 0   No facility-administered medications prior to visit.     Review of Systems:   Constitutional: No night sweats, fevers, chills, or lassitude. +weight gain, fatigue  HEENT: No headaches, difficulty swallowing, tooth/dental problems, or sore throat. No sneezing, itching, ear ache, nasal congestion, or post nasal drip CV:  +orthopnea, leg swelling, PND. No chest pain, anasarca, dizziness, palpitations, syncope Resp: +shortness of breath with exertion; minimally productive cough (baseline). No excess mucus or change in color of mucus. No hemoptysis. No wheezing.  No chest wall deformity GI:  No heartburn, indigestion, abdominal pain, nausea, vomiting, diarrhea, change in bowel habits, loss of appetite, bloody stools.  GU: No dysuria, change in color of urine, urgency or frequency.   Skin: No rash, lesions, ulcerations MSK:  No joint pain Neuro: No memory impairment  Psych: No depression or anxiety. Mood stable.     Physical Exam:  BP (!) 120/52   Pulse 83   Ht 5\' 3"  (1.6 m)   Wt 106 lb 12.8 oz (48.4 kg)   SpO2 95%   BMI 18.92 kg/m   GEN: Pleasant, interactive, well-kempt; in no acute distress. HEENT:  Normocephalic and atraumatic. Cerumen impaction right EAC; Left EAC patent. Left TM pearly gray with present light reflex bilaterally. PERRLA. Sclera white. Nasal turbinates pink, moist and patent bilaterally. No rhinorrhea present. Oropharynx pink and moist, without exudate or edema. No lesions, ulcerations, or postnasal drip.  NECK:  Supple w/ fair ROM. Mild JVD present. Normal carotid impulses w/o bruits. Thyroid symmetrical with no goiter or nodules palpated. No lymphadenopathy.   CV: RRR, no m/r/g, +2 pitting edema BLE. Pulses intact, +2 bilaterally. No cyanosis, pallor or clubbing. PULMONARY:  Unlabored, regular breathing. Bibasilar crackles bilaterally A&P w/o wheezes/rales/rhonchi. No accessory muscle use.  GI: BS  present and normoactive. Soft, non-tender to palpation. No organomegaly or masses detected.  MSK: No erythema, warmth or tenderness. Cap refil <2 sec all extrem. No deformities or joint swelling noted.  Neuro: A/Ox3. No focal deficits noted.   Skin: Warm, no lesions or rashe Psych: Normal affect and behavior. Judgement and thought content appropriate.     Lab Results:  CBC    Component Value Date/Time   WBC 5.3 04/16/2023 1431  RBC 2.97 (L) 04/16/2023 1431   HGB 10.5 (L) 04/16/2023 1431   HGB 10.3 (L) 03/25/2023 0938   HGB 11.2 04/21/2022 0940   HCT 31.7 (L) 04/16/2023 1431   HCT 32.7 (L) 04/21/2022 0940   PLT 175.0 04/16/2023 1431   PLT 181 03/25/2023 0938   PLT 242 04/21/2022 0940   MCV 106.6 (H) 04/16/2023 1431   MCV 104 (H) 04/21/2022 0940   MCH 35.0 (H) 03/25/2023 0938   MCHC 33.1 04/16/2023 1431   RDW 13.1 04/16/2023 1431   RDW 13.1 04/21/2022 0940   LYMPHSABS 1.1 03/25/2023 0938   MONOABS 0.3 03/25/2023 0938   EOSABS 0.1 03/25/2023 0938   BASOSABS 0.0 03/25/2023 0938    BMET    Component Value Date/Time   NA 135 04/16/2023 1431   NA 139 04/21/2022 0940   K 4.4 04/16/2023 1431   CL 97 04/16/2023 1431   CO2 31 04/16/2023 1431   GLUCOSE 97 04/16/2023 1431   GLUCOSE 101 (H) 10/02/2006 0845   BUN 28 (H) 04/16/2023 1431   BUN 35 (H) 04/21/2022 0940   CREATININE 1.16 04/16/2023 1431   CREATININE 1.39 (H) 03/25/2023 0938   CALCIUM 9.6 04/16/2023 1431   GFRNONAA 37 (L) 03/25/2023 0938   GFRAA >60 05/24/2018 0652    BNP    Component Value Date/Time   BNP 2,025.3 (H) 03/17/2022 1622     Imaging:  DG Chest 2 View  Result Date: 05/13/2023 CLINICAL DATA:  Shortness of breath. EXAM: CHEST - 2 VIEW COMPARISON:  December 02, 2022. FINDINGS: Stable moderate to severe cardiomegaly. Mild interstitial densities are noted throughout both lungs which may represent chronic interstitial lung disease. Mild left basilar atelectasis and small pleural effusion is noted.  Bony thorax is unremarkable. IMPRESSION: Stable cardiomegaly. Stable interstitial lung densities are noted consistent with chronic interstitial lung disease. Probable left basilar atelectasis and small pleural effusion is noted. Electronically Signed   By: Lupita Raider M.D.   On: 05/13/2023 15:46    Administration History     None          Latest Ref Rng & Units 02/03/2022   11:01 AM  PFT Results  FVC-Pre L 1.34   FVC-Predicted Pre % 56   Pre FEV1/FVC % % 84   FEV1-Pre L 1.12   FEV1-Predicted Pre % 64   DLCO uncorrected ml/min/mmHg 10.32   DLCO UNC% % 57   DLCO corrected ml/min/mmHg 11.49   DLCO COR %Predicted % 63   DLVA Predicted % 119   TLC L 2.88   TLC % Predicted % 58   RV % Predicted % 62     No results found for: "NITRICOXIDE"      Assessment & Plan:   Pulmonary hypertension (HCC) Volume overload on exam, which would explain her increased dyspnea. She had some improvement in symptoms with increased dosing of spironolactone. Will have her double her lasix to 40 mg daily for the next 5 days. Check BNP and BMET today. CXR to rule out superimposed infection and assess for evidence of pulm edema. Close follow up and strict ED precautions. Walk test without desaturations on room air. Will also obtain CBC to rule out worsening anemia as cause for dyspnea.   Patient Instructions  Continue Albuterol inhaler 2 puffs every 6 hours as needed for shortness of breath or wheezing. Notify if symptoms persist despite rescue inhaler/neb use. Continue opsumit 10 mg daily Continue spironolactone 25 mg (1 tab) daily in AM Continue singulair  1 tab daily  -Increase lasix to 2 tablets (40 mg) daily for the next 5 days. Take in AM. This will make you have to urinate more frequently. Monitor your weights at home. Notify if you have increased weight of 2-3 lb overnight of 5 lb in a week  I may add on potassium depending on your labs today but will call you regarding this  Labs and chest  x ray today  Follow up in one week with Dr. Francine Graven or Philis Nettle. Ok to double book KC. If symptoms do not improve or worsen, please contact office for sooner follow up or seek emergency care.    ILD (interstitial lung disease) (HCC) CT-ILD with fibrotic changes. Flare less likely. If no improvement or worsening with above recommendations, will consider oral steroids. See above.   Chronic respiratory failure with hypoxia (HCC) No new O2 requirement. She will continue supplemental oxygen 2 lpm at night. Monitor during the day for goal >88-90%  Chronic diastolic heart failure (HCC) See above. May consider repeat echo; although, last was stable from 12/2022. Follow up with cardiology as scheduled   I spent 42 minutes of dedicated to the care of this patient on the date of this encounter to include pre-visit review of records, face-to-face time with the patient discussing conditions above, post visit ordering of testing, clinical documentation with the electronic health record, making appropriate referrals as documented, and communicating necessary findings to members of the patients care team.  Noemi Chapel, NP 05/13/2023  Pt aware and understands NP's role.

## 2023-05-13 NOTE — Assessment & Plan Note (Signed)
No new O2 requirement. She will continue supplemental oxygen 2 lpm at night. Monitor during the day for goal >88-90%

## 2023-05-13 NOTE — Patient Instructions (Addendum)
Continue Albuterol inhaler 2 puffs every 6 hours as needed for shortness of breath or wheezing. Notify if symptoms persist despite rescue inhaler/neb use. Continue opsumit 10 mg daily Continue spironolactone 25 mg (1 tab) daily in AM Continue singulair 1 tab daily  -Increase lasix to 2 tablets (40 mg) daily for the next 5 days. Take in AM. This will make you have to urinate more frequently. Monitor your weights at home. Notify if you have increased weight of 2-3 lb overnight of 5 lb in a week  I may add on potassium depending on your labs today but will call you regarding this  Labs and chest x ray today  Follow up in one week with Dr. Francine Graven or Philis Nettle. Ok to double book KC. If symptoms do not improve or worsen, please contact office for sooner follow up or seek emergency care.

## 2023-05-13 NOTE — Assessment & Plan Note (Signed)
CT-ILD with fibrotic changes. Flare less likely. If no improvement or worsening with above recommendations, will consider oral steroids. See above.

## 2023-05-14 NOTE — Progress Notes (Signed)
Please let patient know that her labs and imaging were consistent with volume overload, which is likely the cause of her increased shortness of breath. I want her to take her lasix as directed yesterday and keep her follow up with me next week. Call if symptoms worsen or do not improve.

## 2023-05-15 ENCOUNTER — Telehealth: Payer: Self-pay

## 2023-05-15 DIAGNOSIS — H401421 Capsular glaucoma with pseudoexfoliation of lens, left eye, mild stage: Secondary | ICD-10-CM | POA: Diagnosis not present

## 2023-05-15 DIAGNOSIS — H401412 Capsular glaucoma with pseudoexfoliation of lens, right eye, moderate stage: Secondary | ICD-10-CM | POA: Diagnosis not present

## 2023-05-15 NOTE — Telephone Encounter (Signed)
Called and spoke with pt about the use of lasix. She verbalized understanding, nfn

## 2023-05-15 NOTE — Telephone Encounter (Signed)
Patient checking on RX for Lasix. Pharmacy is [facility] [address]. Patient phone number is [phone].

## 2023-05-15 NOTE — Telephone Encounter (Signed)
-----   Message from Noemi Chapel sent at 05/14/2023  3:40 PM EDT ----- Please let patient know that her labs and imaging were consistent with volume overload, which is likely the cause of her increased shortness of breath. I want her to take her lasix as directed yesterday and keep her follow up with me next week. Call  if symptoms worsen or do not improve.

## 2023-05-19 ENCOUNTER — Other Ambulatory Visit: Payer: Medicare PPO

## 2023-05-19 NOTE — Telephone Encounter (Signed)
Lab and imaging relayed to patient 7/26. No additional questions from the patient.

## 2023-05-20 ENCOUNTER — Other Ambulatory Visit: Payer: Self-pay

## 2023-05-20 ENCOUNTER — Telehealth: Payer: Self-pay | Admitting: Internal Medicine

## 2023-05-20 DIAGNOSIS — I272 Pulmonary hypertension, unspecified: Secondary | ICD-10-CM

## 2023-05-20 NOTE — Telephone Encounter (Signed)
Patient came by the office yesterday afternoon for her standing lab orders through Dr Okey Dupre. There were no current orders in. Dr Okey Dupre was out of the office so I reached out to Primary Care for orders to be placed if possible. Patient decided to come back another day and I advised her that I would call her when the orders were in so she did not have to wait.  Can standing orders be put in for the patient? She comes very often for these.  Appears to be almost every month for a CBC and a CMP.

## 2023-05-20 NOTE — Telephone Encounter (Signed)
Orders placed for pt to get CBC and CMET.

## 2023-05-20 NOTE — Addendum Note (Signed)
Addended by: Theressa Stamps on: 05/20/2023 10:29 AM   Modules accepted: Orders

## 2023-05-21 ENCOUNTER — Encounter: Payer: Self-pay | Admitting: Nurse Practitioner

## 2023-05-21 ENCOUNTER — Ambulatory Visit (INDEPENDENT_AMBULATORY_CARE_PROVIDER_SITE_OTHER): Payer: Medicare PPO | Admitting: Nurse Practitioner

## 2023-05-21 ENCOUNTER — Telehealth: Payer: Self-pay | Admitting: Nurse Practitioner

## 2023-05-21 VITALS — BP 110/40 | HR 75 | Ht 63.0 in | Wt 101.8 lb

## 2023-05-21 DIAGNOSIS — D638 Anemia in other chronic diseases classified elsewhere: Secondary | ICD-10-CM | POA: Diagnosis not present

## 2023-05-21 DIAGNOSIS — I272 Pulmonary hypertension, unspecified: Secondary | ICD-10-CM | POA: Diagnosis not present

## 2023-05-21 DIAGNOSIS — J849 Interstitial pulmonary disease, unspecified: Secondary | ICD-10-CM

## 2023-05-21 DIAGNOSIS — I951 Orthostatic hypotension: Secondary | ICD-10-CM | POA: Diagnosis not present

## 2023-05-21 DIAGNOSIS — J9611 Chronic respiratory failure with hypoxia: Secondary | ICD-10-CM | POA: Diagnosis not present

## 2023-05-21 DIAGNOSIS — I5032 Chronic diastolic (congestive) heart failure: Secondary | ICD-10-CM

## 2023-05-21 LAB — CBC WITH DIFFERENTIAL/PLATELET
Basophils Absolute: 0 10*3/uL (ref 0.0–0.1)
Basophils Relative: 0.6 % (ref 0.0–3.0)
Eosinophils Absolute: 0.1 10*3/uL (ref 0.0–0.7)
Eosinophils Relative: 1.3 % (ref 0.0–5.0)
HCT: 28.8 % — ABNORMAL LOW (ref 36.0–46.0)
Hemoglobin: 9.7 g/dL — ABNORMAL LOW (ref 12.0–15.0)
Lymphocytes Relative: 17.9 % (ref 12.0–46.0)
Lymphs Abs: 1.1 10*3/uL (ref 0.7–4.0)
MCHC: 33.6 g/dL (ref 30.0–36.0)
MCV: 106.3 fl — ABNORMAL HIGH (ref 78.0–100.0)
Monocytes Absolute: 0.4 10*3/uL (ref 0.1–1.0)
Monocytes Relative: 7.6 % (ref 3.0–12.0)
Neutro Abs: 4.3 10*3/uL (ref 1.4–7.7)
Neutrophils Relative %: 72.6 % (ref 43.0–77.0)
Platelets: 188 10*3/uL (ref 150.0–400.0)
RBC: 2.71 Mil/uL — ABNORMAL LOW (ref 3.87–5.11)
RDW: 12.8 % (ref 11.5–15.5)
WBC: 5.9 10*3/uL (ref 4.0–10.5)

## 2023-05-21 LAB — BASIC METABOLIC PANEL
BUN: 41 mg/dL — ABNORMAL HIGH (ref 6–23)
CO2: 31 mEq/L (ref 19–32)
Calcium: 9.2 mg/dL (ref 8.4–10.5)
Chloride: 93 mEq/L — ABNORMAL LOW (ref 96–112)
Creatinine, Ser: 1.49 mg/dL — ABNORMAL HIGH (ref 0.40–1.20)
GFR: 32 mL/min — ABNORMAL LOW (ref >60.00)
Glucose, Bld: 114 mg/dL — ABNORMAL HIGH (ref 70–99)
Potassium: 4.4 mEq/L (ref 3.5–5.1)
Sodium: 132 mEq/L — ABNORMAL LOW (ref 135–145)

## 2023-05-21 NOTE — Assessment & Plan Note (Signed)
Likely due to restarting diltiazem as well as increased dosing of lasix/spironolactone. She is back to her normal diuretic regimen. Recommend she hold the diltiazem since this was discontinued 6 months ago by her PCP due to similar symptoms. Advised she call her PCP and/or cardiology to discuss next steps and monitor BPs at home. Will check CBC today to ensure her hgb has not downtrending, causing symptoms, and ensure electrolytes stable. ED precautions advised. Close follow up.  Patient Instructions  Continue Albuterol inhaler 2 puffs every 6 hours as needed for shortness of breath or wheezing. Notify if symptoms persist despite rescue inhaler/neb use. Continue opsumit 10 mg daily Continue spironolactone 1/2 tablet daily in AM Continue singulair 1 tab daily Continue lasix 20 mg daily in AM  Stop the diltiazem and monitor your blood pressure at home for goal <140/90 and >100/60. Call your cardiologist's or Dr. Frutoso Chase office and let them know that you had stopped this around 6 months ago and then restarted about 1.5 weeks ago and now you are having dizziness with low blood pressure.   Labs today    Follow up in 2-3 weeks with Dr. Francine Graven or Philis Nettle. If symptoms do not improve or worsen, please contact office for sooner follow up or seek emergency care.

## 2023-05-21 NOTE — Assessment & Plan Note (Signed)
PH/CHF exacerbation. Clinically improved. Weight is down 5 lb and resolution of pitting edema in BLE extremities. She is back on her normal regimen/dosing with aldactone, lasix, and opsumit. See above. Advised to monitor weights and notify of weight gain of 2-3 lb overnight or 5 lb in a week.

## 2023-05-21 NOTE — Assessment & Plan Note (Signed)
CT-ILD with fibrotic changes. Last PFT stable. Follows with [facility] pulmonology. Antifibrotic therapy deferred due to side effect profile. Continue with surveillance.

## 2023-05-21 NOTE — Telephone Encounter (Signed)
Pt. Was seen today and want to tell Np cobb if she takes OPSUMIT 10 MG TABS  does she still need to take the blood pressure pill

## 2023-05-21 NOTE — Assessment & Plan Note (Signed)
Chronic anemia. She had drop from 10.5 to 9.7 on most recent CBC; likely hemodilutional given volume overload. Will recheck today. If she has had any further downtrend, will need further workup. Denies any obvious bleeding.

## 2023-05-21 NOTE — Patient Instructions (Addendum)
Continue Albuterol inhaler 2 puffs every 6 hours as needed for shortness of breath or wheezing. Notify if symptoms persist despite rescue inhaler/neb use. Continue opsumit 10 mg daily Continue spironolactone 1/2 tablet daily in AM Continue singulair 1 tab daily Continue lasix 20 mg daily in AM  Stop the diltiazem and monitor your blood pressure at home for goal <140/90 and >100/60. Call your cardiologist's or Dr. Frutoso Chase office and let them know that you had stopped this around 6 months ago and then restarted about 1.5 weeks ago and now you are having dizziness with low blood pressure.   Labs today    Follow up in 2-3 weeks with Dr. Francine Graven or Philis Nettle. If symptoms do not improve or worsen, please contact office for sooner follow up or seek emergency care.

## 2023-05-21 NOTE — Assessment & Plan Note (Signed)
No new O2 requirement. She will continue supplemental oxygen 2 lpm at night. Monitor during the day for goal >88-90%

## 2023-05-21 NOTE — Assessment & Plan Note (Addendum)
See above. Follow up with cardiology as scheduled

## 2023-05-21 NOTE — Progress Notes (Signed)
@Patient  ID: Paula Ward, female    DOB: 1936-09-02, 87 y.o.   MRN: [id number]  Chief Complaint  Patient presents with   Follow-up    F/up, sob, dizzy when bends over.    Referring provider: [name], *  HPI: 87 year old female, never smoker followed for CT ILD and pulmonary hypertension. She is a patient of Dr. Lanora Manis and last seen in office 05/13/2023. Past medical history significant for scleroderma, AVMs of small bowel, chronic blood loss anemia, CKD, pericardial effusion, CHF, AS, HTN, GERD.  TEST/EVENTS:  04/12/2019 HRCT chest: probable UIP pattern fibrosis, slightly progressed since 2012. Small pulmonary nodules measuring up to 6 mm. Large pericardial effusion 03/01/2020 PFT: FVC 68, FEV1 65, ratio 73, DLCO 37 02/28/2021 PFT: FVC 59, FEV1 57, ratio 72, DLCO 41 02/03/2022 PFT: FVC 56, FEV1 64, ratio 84, DLCOcor 63 04/23/2022 CT chest: marked interval improvement in areas of ground glass. Dependent airspace disease in lower lobes largely resolved. Subpleural reticulation b/l, similar. Similar moderate to large pericardial effusion. Enlargement of the pulmonary outflow tract/main pulmonary arteries suggests PH. 7 mm RUL nodule, stable. Similar left pleural effusion. Atherosclerosis.  04/25/2022 R/LHC: no obstructive disease. Mild PH with PA pressure 51/15, mean 28 mmHg, PVR 2 Wood units. Moderate AS.  01/15/2023 echo: EF 60-65%. GIDD. RV size and function nl. Moderately elevated PASP. LA severely dilated. Large pericardial effusio. Mild to moderate MS. Tricuspid regurgitation, mild to moderate. AR moderate.  05/13/2023 CXR: moderate to severe cardiomegaly. Mild interstitial densities throughout both lungs. Mild left basilar atelectasis and small pleural effusion.  05/13/2023 BNP 1750  09/05/2023: OV with Dr. Francine Graven. Followed by [facility] for ILD and PH related to her scleroderma. Previously on supplemental oxygen in years past but based on 6 MWT at [facility], she was able  to be weaned off of this. Only on it at night. Doing find since last visit. Nasal drainage much improved on nasal sprays. Continues on opsumit 10 mg daily. Appears to have progressive fibrotic changes on CT imaging. She has a progressive decline in PFTs; although, testing before visit was stable. Antifibrotic therapy previously discussed at [facility] and not initiated due to side effect profile.   05/13/2023: OV with Kilyn Maragh NP for acute visit. She's had more trouble with her breathing over the past few weeks. She feels like she gets out of breath just walking around her house. She was also experiencing some chest pressure but she restarted her diltiazem (she had discontinued this 6 months ago) and increased her spironolactone to 25 mg over this past weekend and the pressure resolved. She has an occasional cough, which doesn't seem to be much changed from baseline. Gets up a small amount of white phlegm from time to time. Not noticing much wheezing. She's had more swelling in her legs. She does feel like this has improved some since she increased the spironolactone but still not back to normal. She finds it harder to lay flat at night. Wakes up short winded sometimes. She denies any fevers, chills, hemoptysis, chest pain, palpitations, dizziness, calf pain.  She is staking lasix 20 mg daily. She is on 10 mg opsumit daily. She has used her rescue inhaler, which she feels does help some. Her weight is up 5 lb over the last four weeks or so.  05/21/2023: Today - follow up Patient presents today for follow up. She was treated for acute CHF/PH exacerbation after our last visit. She had evidence of volume overload with weight  gain, leg swelling, elevated BNP, and hyponatremia. She had already increased her spironolactone to 25 mg and then restarted her diltiazem on her own, which had been discontinued 6 months ago. She was instructed to increase her lasix to 40 mg daily from 20 mg for 5 days. She did this, which she completed  this past Monday, 7/29. She was feeling much better on Monday and Tuesday this week. Legs are no longer swollen. Her weight is down 5 pounds since she was here last week. She felt like her breathing was better. Yesterday, she started feeling lightheaded and dizzy, especially with standing. She feels weaker and more fatigued today. She hasn't checked her blood pressure at home. She denies any syncope or presyncope. She has not had any cough, chest congestion, wheezing. Orthopnea has improved. She is back to her normal dosing of lasix and spironolactone (20 mg lasix, 12.5 mg spironolactone). She's still taking 120 mg of diltiazem daily, which she was has been off for the last 6 months due to hypotension but restarted herself around 7/20. No SABA use.   Allergies  Allergen Reactions   Codeine Nausea Only    Hallucinations, too   Other Nausea And Vomiting    "-mycin" antibiotics.   Also cause hallucinations.   Erythromycin Nausea And Vomiting   Iodinated Contrast Media Other (See Comments)    Renal issues   Lisinopril     Pt doesn't remember    Mycophenolate Mofetil Nausea Only   Sulfa Antibiotics Other (See Comments)    unknown    Immunization History  Administered Date(s) Administered   Fluad Quad(high Dose 65+) 07/24/2020   Influenza, High Dose Seasonal PF 07/07/2017, 06/21/2019   Influenza-Unspecified 07/15/2013, 02/16/2015, 07/19/2015, 07/18/2016, 08/20/2018, 06/24/2021, 08/03/2022   Moderna Sars-Covid-2 Vaccination 11/01/2019, 11/29/2019, 09/14/2020, 02/16/2021   Pneumococcal Conjugate-13 01/30/2014   Pneumococcal Polysaccharide-23 08/30/2007   Respiratory Syncytial Virus Vaccine,Recomb Aduvanted(Arexvy) 09/24/2022   Tdap 06/26/2011, 06/07/2018   Zoster, Live 08/15/2014    Past Medical History:  Diagnosis Date   Anemia    Angiodysplasia of stomach and duodenum    Arthus phenomenon    AVM (arteriovenous malformation)    Blood transfusion without reported diagnosis    last 4  units 12-22-15, Iron infusion x2 last -01-07-16,01-14-16.   Breast cancer (HCC) 1989   Left   Candida esophagitis (HCC)    Cataract    Chronic kidney disease    Chronic mild renal insuffiency   CREST syndrome (HCC)    GERD (gastroesophageal reflux disease)    w/ HH   Interstitial lung disease (HCC)    Nodule of kidney    Pericardial effusion    PONV (postoperative nausea and vomiting)    Pulmonary embolus (HCC) 2003   Pulmonary hypertension (HCC)    followed by Dr Bland Span at [facility], now Dr. Penni Bombard visit end 2'17.   Rectal incontinence    Renal cell carcinoma (HCC)    Scleroderma (HCC)    Tubular adenoma of colon    Uterine polyp     Tobacco History: Social History   Tobacco Use  Smoking Status Never  Smokeless Tobacco Never   Counseling given: Not Answered   Outpatient Medications Prior to Visit  Medication Sig Dispense Refill   acetaminophen (TYLENOL) 325 MG tablet Take 2 tablets (650 mg total) by mouth every 6 (six) hours as needed for moderate pain. 30 tablet 0   albuterol (VENTOLIN HFA) 108 (90 Base) MCG/ACT inhaler INHALE 2 PUFFS INTO THE LUNGS EVERY 6 HOURS  AS NEEDED FOR WHEEZING OR SHORTNESS OF BREATH 6.7 g 3   citalopram (CELEXA) 10 MG tablet Take 1 tablet (10 mg total) by mouth daily. (Patient taking differently: Take 10 mg by mouth daily as needed (depression).) 30 tablet 3   Cyanocobalamin (VITAMIN B-12) 1000 MCG SUBL Place 1,000 mcg under the tongue. Taking twice a week     docusate sodium (COLACE) 100 MG capsule Take 1 capsule (100 mg total) by mouth 2 (two) times daily. 10 capsule 0   famotidine (PEPCID) 20 MG tablet Take 1 tablet (20 mg total) by mouth at bedtime. 30 tablet 11   fluticasone (FLONASE) 50 MCG/ACT nasal spray Place 1 spray into both nostrils daily as needed for allergies. (Patient taking differently: Place 1 spray into both nostrils in the morning and at bedtime.) 48 g 0   furosemide (LASIX) 20 MG tablet Take 1 tablet by mouth once daily 90  tablet 0   guaiFENesin (MUCINEX) 600 MG 12 hr tablet Take 300 mg by mouth daily.     ipratropium (ATROVENT) 0.03 % nasal spray Place 2 sprays into both nostrils every 12 (twelve) hours. 30 mL 12   latanoprost (XALATAN) 0.005 % ophthalmic solution Place 1 drop into both eyes at bedtime.     magic mouthwash SOLN Take 10 mLs by mouth 4 (four) times daily as needed for mouth pain. 15 mL 2   mirtazapine (REMERON) 15 MG tablet Take 1 tablet (15 mg total) by mouth at bedtime. 30 tablet 3   montelukast (SINGULAIR) 10 MG tablet TAKE 1 TABLET BY MOUTH AT BEDTIME 90 tablet 0   Multiple Vitamin (MULTIVITAMIN) capsule Take 1 capsule by mouth daily.     mupirocin ointment (BACTROBAN) 2 % Apply 1 Application topically 2 (two) times daily as needed (dry skin).     ondansetron (ZOFRAN) 4 MG tablet Take 1 tablet (4 mg total) by mouth every 6 (six) hours as needed for nausea. 20 tablet 0   OPSUMIT 10 MG TABS Take 10 mg by mouth daily.     OXYGEN Inhale into the lungs. 2 LMP at night and upon exertion     pantoprazole (PROTONIX) 40 MG tablet Take 1 tablet (40 mg total) by mouth daily. 30 tablet 11   polyethylene glycol (MIRALAX / GLYCOLAX) 17 g packet Take 17 g by mouth daily. 14 each 0   spironolactone (ALDACTONE) 25 MG tablet Take 0.5 tablets (12.5 mg total) by mouth daily. 45 tablet 3   sucralfate (CARAFATE) 1 GM/10ML suspension TAKE 10 MLS BY MOUTH EVERY 6 HOURS AS NEEDED FOR REFLUX 420 mL 0   No facility-administered medications prior to visit.     Review of Systems:   Constitutional: No night sweats, fevers, chills, or lassitude. +weight loss, fatigue  HEENT: No headaches, difficulty swallowing, tooth/dental problems, or sore throat. No sneezing, itching, ear ache, nasal congestion, or post nasal drip CV:  +dizziness/lightheadedness. No chest pain, orthopnea, PND, leg swelling, anasarca, palpitations, syncope Resp: +shortness of breath with exertion (improved); minimally productive cough (baseline). No  excess mucus or change in color of mucus. No hemoptysis. No wheezing.  No chest wall deformity GI:  No heartburn, indigestion, abdominal pain, nausea, vomiting, diarrhea, change in bowel habits, loss of appetite, bloody stools.  GU: No dysuria, change in color of urine, urgency or frequency.   Skin: No rash, lesions, ulcerations MSK:  No joint pain Neuro: No memory impairment  Psych: No depression or anxiety. Mood stable.     Physical Exam:  BP (!) 110/40 (BP Location: Left Arm)   Pulse 75   Ht 5\' 3"  (1.6 m)   Wt 101 lb 12.8 oz (46.2 kg)   SpO2 95%   BMI 18.03 kg/m   GEN: Pleasant, interactive, well-kempt; in no acute distress. HEENT:  Normocephalic and atraumatic. PERRLA. Sclera white. Nasal turbinates pink, moist and patent bilaterally. No rhinorrhea present. Oropharynx pink and moist, without exudate or edema. No lesions, ulcerations, or postnasal drip.  NECK:  Supple w/ fair ROM. No JVD present. Normal carotid impulses w/o bruits. Thyroid symmetrical with no goiter or nodules palpated. No lymphadenopathy.   CV: RRR, no m/r/g, no peripheral edema. Pulses intact, +2 bilaterally. No cyanosis, pallor or clubbing. PULMONARY:  Unlabored, regular breathing. Minimal bibasilar crackles otherwise clear b/l A&P w/o wheezes/rales/rhonchi. No accessory muscle use.  GI: BS present and normoactive. Soft, non-tender to palpation. No organomegaly or masses detected.  MSK: No erythema, warmth or tenderness. Cap refil <2 sec all extrem. No deformities or joint swelling noted.  Neuro: A/Ox3. No focal deficits noted.   Skin: Warm, no lesions or rashe Psych: Normal affect and behavior. Judgement and thought content appropriate.     Lab Results:  CBC    Component Value Date/Time   WBC 6.1 05/13/2023 1507   RBC 2.78 (L) 05/13/2023 1507   HGB 9.7 (L) 05/13/2023 1507   HGB 10.3 (L) 03/25/2023 0938   HGB 11.2 04/21/2022 0940   HCT 29.9 (L) 05/13/2023 1507   HCT 32.7 (L) 04/21/2022 0940   PLT  191.0 05/13/2023 1507   PLT 181 03/25/2023 0938   PLT 242 04/21/2022 0940   MCV 107.2 (H) 05/13/2023 1507   MCV 104 (H) 04/21/2022 0940   MCH 35.0 (H) 03/25/2023 0938   MCHC 32.6 05/13/2023 1507   RDW 13.1 05/13/2023 1507   RDW 13.1 04/21/2022 0940   LYMPHSABS 0.9 05/13/2023 1507   MONOABS 0.3 05/13/2023 1507   EOSABS 0.1 05/13/2023 1507   BASOSABS 0.0 05/13/2023 1507    BMET    Component Value Date/Time   NA 134 (L) 05/13/2023 1507   NA 139 04/21/2022 0940   K 4.9 05/13/2023 1507   CL 95 (L) 05/13/2023 1507   CO2 30 05/13/2023 1507   GLUCOSE 89 05/13/2023 1507   GLUCOSE 101 (H) 10/02/2006 0845   BUN 37 (H) 05/13/2023 1507   BUN 35 (H) 04/21/2022 0940   CREATININE 1.29 (H) 05/13/2023 1507   CREATININE 1.39 (H) 03/25/2023 0938   CALCIUM 9.7 05/13/2023 1507   GFRNONAA 37 (L) 03/25/2023 0938   GFRAA >60 05/24/2018 0652    BNP    Component Value Date/Time   BNP 2,025.3 (H) 03/17/2022 1622     Imaging:  DG Chest 2 View  Result Date: 05/13/2023 CLINICAL DATA:  Shortness of breath. EXAM: CHEST - 2 VIEW COMPARISON:  December 02, 2022. FINDINGS: Stable moderate to severe cardiomegaly. Mild interstitial densities are noted throughout both lungs which may represent chronic interstitial lung disease. Mild left basilar atelectasis and small pleural effusion is noted. Bony thorax is unremarkable. IMPRESSION: Stable cardiomegaly. Stable interstitial lung densities are noted consistent with chronic interstitial lung disease. Probable left basilar atelectasis and small pleural effusion is noted. Electronically Signed   By: Lupita Raider M.D.   On: 05/13/2023 15:46    Administration History     None          Latest Ref Rng & Units 02/03/2022   11:01 AM  PFT Results  FVC-Pre L  1.34   FVC-Predicted Pre % 56   Pre FEV1/FVC % % 84   FEV1-Pre L 1.12   FEV1-Predicted Pre % 64   DLCO uncorrected ml/min/mmHg 10.32   DLCO UNC% % 57   DLCO corrected ml/min/mmHg 11.49   DLCO COR  %Predicted % 63   DLVA Predicted % 119   TLC L 2.88   TLC % Predicted % 58   RV % Predicted % 62     No results found for: "NITRICOXIDE"      Assessment & Plan:   Orthostatic hypotension Likely due to restarting diltiazem as well as increased dosing of lasix/spironolactone. She is back to her normal diuretic regimen. Recommend she hold the diltiazem since this was discontinued 6 months ago by her PCP due to similar symptoms. Advised she call her PCP and/or cardiology to discuss next steps and monitor BPs at home. Will check CBC today to ensure her hgb has not downtrending, causing symptoms, and ensure electrolytes stable. ED precautions advised. Close follow up.  Patient Instructions  Continue Albuterol inhaler 2 puffs every 6 hours as needed for shortness of breath or wheezing. Notify if symptoms persist despite rescue inhaler/neb use. Continue opsumit 10 mg daily Continue spironolactone 1/2 tablet daily in AM Continue singulair 1 tab daily Continue lasix 20 mg daily in AM  Stop the diltiazem and monitor your blood pressure at home for goal <140/90 and >100/60. Call your cardiologist's or Dr. Frutoso Chase office and let them know that you had stopped this around 6 months ago and then restarted about 1.5 weeks ago and now you are having dizziness with low blood pressure.   Labs today    Follow up in 2-3 weeks with Dr. Francine Graven or Philis Nettle. If symptoms do not improve or worsen, please contact office for sooner follow up or seek emergency care.    Pulmonary hypertension (HCC) PH/CHF exacerbation. Clinically improved. Weight is down 5 lb and resolution of pitting edema in BLE extremities. She is back on her normal regimen/dosing with aldactone, lasix, and opsumit. See above. Advised to monitor weights and notify of weight gain of 2-3 lb overnight or 5 lb in a week.   Chronic diastolic heart failure (HCC) See above. Follow up with cardiology as scheduled  ILD (interstitial lung  disease) (HCC) CT-ILD with fibrotic changes. Last PFT stable. Follows with [facility] pulmonology. Antifibrotic therapy deferred due to side effect profile. Continue with surveillance.   Chronic respiratory failure with hypoxia (HCC) No new O2 requirement. She will continue supplemental oxygen 2 lpm at night. Monitor during the day for goal >88-90%   Anemia, chronic disease Chronic anemia. She had drop from 10.5 to 9.7 on most recent CBC; likely hemodilutional given volume overload. Will recheck today. If she has had any further downtrend, will need further workup. Denies any obvious bleeding.    I spent 41 minutes of dedicated to the care of this patient on the date of this encounter to include pre-visit review of records, face-to-face time with the patient discussing conditions above, post visit ordering of testing, clinical documentation with the electronic health record, making appropriate referrals as documented, and communicating necessary findings to members of the patients care team.  Noemi Chapel, NP 05/21/2023  Pt aware and understands NP's role.

## 2023-05-22 ENCOUNTER — Telehealth: Payer: Self-pay | Admitting: Cardiology

## 2023-05-22 NOTE — Telephone Encounter (Signed)
I have placed standing orders for these.

## 2023-05-22 NOTE — Addendum Note (Signed)
Addended by: Hillard Danker A on: 05/22/2023 10:47 AM   Modules accepted: Orders

## 2023-05-22 NOTE — Telephone Encounter (Signed)
Call to patient and she picks up and hangs up. Unable to talk with her.

## 2023-05-22 NOTE — Progress Notes (Signed)
Hgb is stable so unlikely cause of her dizziness. Her sodium is a little lower than it was last time. Please have her follow up with her PCP next week to redraw and ensure this has corrected/stabilized. Thanks

## 2023-05-22 NOTE — Telephone Encounter (Signed)
Call to mobile and she continues to pick up and hang up.  Call to Home and LM that our calls may come as spam and she is hanging up so unable to LM on her mobile, and please call the office

## 2023-05-22 NOTE — Telephone Encounter (Signed)
Left voicemail to return call to office.

## 2023-05-22 NOTE — Telephone Encounter (Signed)
Pt c/o medication issue:  1. Name of Medication: Diltiazem   120 mg  How are you currently taking this medication (dosage and times per day)?   3. Are you having a reaction (difficulty breathing--STAT)?   4. What is your medication issue? Patient said she have been having a lot of problems with shortness of breath, so they put her back on Diltiazem. It really helps so much. Only one problem, her blood pressure is now dropping too late. She wants to know what to do about taking the Diltiazem. Marland Kitchen

## 2023-05-23 ENCOUNTER — Other Ambulatory Visit: Payer: Self-pay | Admitting: Internal Medicine

## 2023-05-25 NOTE — Telephone Encounter (Signed)
Patient returned RN's call. 

## 2023-05-25 NOTE — Telephone Encounter (Signed)
Patient states drop in BP at her doctor visit (Pulmonary) Last Thursday at Pulmonary BP 110/40  then dropped during orthostatics to 80/40. 118/40's is her average she does not have a list of BP's to give me.   Patient states she was taken off diltiazem last year. She states started 2 weeks ago when had SOB.  She states she started it on her own.  She states she has always taken a BP med for her pressure in her lungs and 20 years ago this is what she took.  Advised she should not start any medication without speaking with the provider. She states that her breathing is a little better with taking the diltiazem, but again her BP is low. Asymptomatic. She had Increased Lasix 40 mg for 5 days then back to 20 mg daily and this resolved leg edema and reduced weight by 5 pounds.Marland Kitchen Spironolactone was 25 mg for 5 days  but she has returned to 12.5 mg daily. Please see pulmonary notes.   Next appt is with Dr Jens Som on the 19th. Please advise recommendations

## 2023-05-25 NOTE — Telephone Encounter (Signed)
Call to patietn. LM to call office

## 2023-05-26 NOTE — Telephone Encounter (Signed)
Left message for patient with Dr Creshaw's recommendations.   

## 2023-05-27 ENCOUNTER — Inpatient Hospital Stay: Payer: Medicare PPO

## 2023-05-27 ENCOUNTER — Telehealth: Payer: Self-pay | Admitting: Hematology and Oncology

## 2023-05-27 ENCOUNTER — Inpatient Hospital Stay: Payer: Medicare PPO | Admitting: Hematology and Oncology

## 2023-05-27 MED ORDER — DILTIAZEM HCL ER COATED BEADS 120 MG PO CP24: 120.0000 mg | ORAL_CAPSULE | Freq: Every day | ORAL | 3 refills | Status: DC

## 2023-05-27 NOTE — Addendum Note (Signed)
Addended by: Freddi Starr on: 05/27/2023 02:41 PM   Modules accepted: Orders

## 2023-05-27 NOTE — Telephone Encounter (Signed)
Spoke with pt, she reports being off the diltiazem for 2 days and her bp this morning was 117 sitting and 109 standing. She continues to have dizziness. She reports because of the lung medication, opsumit, she is taking she has to take a blood pressure pill for the pressure in her lungs. She wants to know if she can go on a low dose diltiazem to help the pressure in the lungs. Her furosemide is 20 mg once daily and spironolactone is 12.5 mg once daily. Explained to patient if she takes diltiazem, it will drop her bp more and we do not want her to fall or pass out. Patient voiced understanding but reports she has to have it for her breathing. Will forward for dr Jens Som review

## 2023-05-27 NOTE — Telephone Encounter (Signed)
Called and spoke with patient. Patient stated she did not have any questions for Katie,NP at this time.  Nothing further at this time.

## 2023-05-27 NOTE — Telephone Encounter (Signed)
Spoke with pt, Aware of dr crenshaw's recommendations. New script sent to the pharmacy  

## 2023-05-29 NOTE — Progress Notes (Unsigned)
HPI: FU pericardial effusion and pulmonary hypertension. 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 Has had IVC filter placed.  Patient has had a large pericardial effusion dating back to at least 2003.  She becomes more dyspneic with anemia. Patient has been followed at [facility] for her pulmonary hypertension.  She has previously not wanted to try Flolan.  She is presently on macitentan and diltiazem.  She apparently did not tolerate CellCept or azathioprine for her pulmonary fibrosis and has been maintained on prednisone.  Most recent echocardiogram May 2023 showed normal LV function, moderate left ventricular hypertrophy, grade 1 diastolic dysfunction, severe left atrial enlargement, large pericardial effusion, large pleural effusion, moderate aortic stenosis with mean gradient 25 mmHg.  Cardiac catheterization July 2023 showed no obstructive coronary disease, mild pulmonary hypertension, moderate aortic stenosis with mean gradient 16 mmHg and aortic valve area 1.2 cm.  Lower extremity venous Doppler September 2023 showed no DVT.  Note patient also with history of contrast nephropathy.  Patient contacted the office recently secondary to hypotension and her Cardizem was held.  Echocardiogram March 2024 showed normal LV function, moderate left ventricular hypertrophy, grade 1 diastolic dysfunction, moderate pulmonary hypertension, severe left atrial enlargement, large pericardial effusion, mild to moderate mitral stenosis with mean gradient 7 mmHg, mild to moderate tricuspid regurgitation and moderate aortic insufficiency.  No evidence of tamponade.  Since last seen,   Current Outpatient Medications  Medication Sig Dispense Refill   acetaminophen (TYLENOL) 325 MG tablet Take 2 tablets (650 mg total) by mouth every 6 (six) hours as needed for moderate pain. 30 tablet 0   albuterol (VENTOLIN HFA) 108 (90 Base) MCG/ACT inhaler INHALE 2  PUFFS INTO THE LUNGS EVERY 6 HOURS AS NEEDED FOR WHEEZING OR SHORTNESS OF BREATH 6.7 g 3   citalopram (CELEXA) 10 MG tablet Take 1 tablet (10 mg total) by mouth daily. (Patient taking differently: Take 10 mg by mouth daily as needed (depression).) 30 tablet 3   Cyanocobalamin (VITAMIN B-12) 1000 MCG SUBL Place 1,000 mcg under the tongue. Taking twice a week     diltiazem (CARDIZEM CD) 120 MG 24 hr capsule Take 1 capsule (120 mg total) by mouth daily. 90 capsule 3   docusate sodium (COLACE) 100 MG capsule Take 1 capsule (100 mg total) by mouth 2 (two) times daily. 10 capsule 0   famotidine (PEPCID) 20 MG tablet Take 1 tablet (20 mg total) by mouth at bedtime. 30 tablet 11   fluticasone (FLONASE) 50 MCG/ACT nasal spray Place 1 spray into both nostrils daily as needed for allergies. (Patient taking differently: Place 1 spray into both nostrils in the morning and at bedtime.) 48 g 0   furosemide (LASIX) 20 MG tablet Take 1 tablet by mouth once daily 90 tablet 0   guaiFENesin (MUCINEX) 600 MG 12 hr tablet Take 300 mg by mouth daily.     ipratropium (ATROVENT) 0.03 % nasal spray Place 2 sprays into both nostrils every 12 (twelve) hours. 30 mL 12   latanoprost (XALATAN) 0.005 % ophthalmic solution Place 1 drop into both eyes at bedtime.     magic mouthwash SOLN Take 10 mLs by mouth 4 (four) times daily as needed for mouth pain. 15 mL 2   mirtazapine (REMERON) 15 MG tablet Take 1 tablet (15 mg total) by mouth at bedtime. 30 tablet 3   montelukast (SINGULAIR) 10 MG tablet TAKE 1 TABLET BY MOUTH AT BEDTIME 90 tablet  0   Multiple Vitamin (MULTIVITAMIN) capsule Take 1 capsule by mouth daily.     mupirocin ointment (BACTROBAN) 2 % Apply 1 Application topically 2 (two) times daily as needed (dry skin).     ondansetron (ZOFRAN) 4 MG tablet Take 1 tablet (4 mg total) by mouth every 6 (six) hours as needed for nausea. 20 tablet 0   OPSUMIT 10 MG TABS Take 10 mg by mouth daily.     OXYGEN Inhale into the lungs. 2  LMP at night and upon exertion     pantoprazole (PROTONIX) 40 MG tablet Take 1 tablet (40 mg total) by mouth daily. 30 tablet 11   polyethylene glycol (MIRALAX / GLYCOLAX) 17 g packet Take 17 g by mouth daily. 14 each 0   spironolactone (ALDACTONE) 25 MG tablet Take 0.5 tablets (12.5 mg total) by mouth daily. 45 tablet 3   sucralfate (CARAFATE) 1 GM/10ML suspension TAKE 10 MLS BY MOUTH EVERY 6 HOURS AS NEEDED FOR REFLUX 420 mL 0   No current facility-administered medications for this visit.     Past Medical History:  Diagnosis Date   Anemia    Angiodysplasia of stomach and duodenum    Arthus phenomenon    AVM (arteriovenous malformation)    Blood transfusion without reported diagnosis    last 4 units 12-22-15, Iron infusion x2 last -01-07-16,01-14-16.   Breast cancer (HCC) 1989   Left   Candida esophagitis (HCC)    Cataract    Chronic kidney disease    Chronic mild renal insuffiency   CREST syndrome (HCC)    GERD (gastroesophageal reflux disease)    w/ HH   Interstitial lung disease (HCC)    Nodule of kidney    Pericardial effusion    PONV (postoperative nausea and vomiting)    Pulmonary embolus (HCC) 2003   Pulmonary hypertension (HCC)    followed by Dr Bland Span at [facility], now Dr. Penni Bombard visit end 2'17.   Rectal incontinence    Renal cell carcinoma (HCC)    Scleroderma (HCC)    Tubular adenoma of colon    Uterine polyp     Past Surgical History:  Procedure Laterality Date   APPENDECTOMY  1962   BREAST LUMPECTOMY  1989   left   CATARACT EXTRACTION, BILATERAL Bilateral 12/2013   COLONOSCOPY WITH PROPOFOL N/A 04/15/2021   Procedure: COLONOSCOPY WITH PROPOFOL;  Surgeon: Rachael Fee, MD;  Location: [facility];  Service: Endoscopy;  Laterality: N/A;   ENTEROSCOPY N/A 01/18/2016   Procedure: ENTEROSCOPY;  Surgeon: Napoleon Form, MD;  Location: [facility];  Service: Endoscopy;  Laterality: N/A;   ENTEROSCOPY N/A 05/24/2018   Procedure: ENTEROSCOPY;  Surgeon:  Lynann Bologna, MD;  Location: [facility];  Service: Endoscopy;  Laterality: N/A;   ENTEROSCOPY N/A 03/29/2021   Procedure: ENTEROSCOPY;  Surgeon: Beverley Fiedler, MD;  Location: [facility];  Service: Gastroenterology;  Laterality: N/A;   ENTEROSCOPY N/A 04/13/2021   Procedure: ENTEROSCOPY;  Surgeon: Benancio Deeds, MD;  Location: [facility];  Service: Gastroenterology;  Laterality: N/A;   ESOPHAGOGASTRODUODENOSCOPY (EGD) WITH PROPOFOL N/A 12/21/2015   Procedure: ESOPHAGOGASTRODUODENOSCOPY (EGD) WITH PROPOFOL;  Surgeon: Hilarie Fredrickson, MD;  Location: [facility];  Service: Endoscopy;  Laterality: N/A;   HOT HEMOSTASIS N/A 05/24/2018   Procedure: HOT HEMOSTASIS (ARGON PLASMA COAGULATION/BICAP);  Surgeon: Lynann Bologna, MD;  Location: [facility];  Service: Endoscopy;  Laterality: N/A;   HOT HEMOSTASIS N/A 03/29/2021   Procedure: HOT HEMOSTASIS (ARGON PLASMA COAGULATION/BICAP);  Surgeon: Rhea Belton,  Carie Caddy, MD;  Location: [facility];  Service: Gastroenterology;  Laterality: N/A;   HOT HEMOSTASIS N/A 04/13/2021   Procedure: HOT HEMOSTASIS (ARGON PLASMA COAGULATION/BICAP);  Surgeon: Benancio Deeds, MD;  Location: [facility];  Service: Gastroenterology;  Laterality: N/A;   INTRAMEDULLARY (IM) NAIL INTERTROCHANTERIC Left 10/11/2022   Procedure: INTRAMEDULLARY (IM) NAIL INTERTROCHANTERIC;  Surgeon: Ollen Gross, MD;  Location: [facility];  Service: Orthopedics;  Laterality: Left;   IR GENERIC HISTORICAL  06/05/2016   IR RADIOLOGIST EVAL & MGMT 06/05/2016 [name], MD [facility] INTERV RAD   IVC Filter     KIDNEY SURGERY     left -"laser surgery by Dr. Fredia Sorrow- 5 yrs ago" no removal   RIGHT/LEFT HEART CATH AND CORONARY ANGIOGRAPHY N/A 04/25/2022   Procedure: RIGHT/LEFT HEART CATH AND CORONARY ANGIOGRAPHY;  Surgeon: Tonny Bollman, MD;  Location: [facility];  Service: Cardiovascular;  Laterality: N/A;   SCHLEROTHERAPY  05/24/2018   Procedure: Theresia Majors;  Surgeon: Lynann Bologna, MD;   Location: [facility];  Service: Endoscopy;;   SUBMUCOSAL TATTOO INJECTION  04/13/2021   Procedure: SUBMUCOSAL TATTOO INJECTION;  Surgeon: Benancio Deeds, MD;  Location: [facility];  Service: Gastroenterology;;   TONSILLECTOMY     TUBAL LIGATION      Social History   Socioeconomic History   Marital status: Married    Spouse name: Not on file   Number of children: 2   Years of education: Not on file   Highest education level: Not on file  Occupational History   Occupation: Retired  Tobacco Use   Smoking status: Never   Smokeless tobacco: Never  Vaping Use   Vaping status: Never Used  Substance and Sexual Activity   Alcohol use: No   Drug use: No   Sexual activity: Not Currently  Other Topics Concern   Not on file  Social History Narrative   Married '611 son- '65, 1 daughter '63; 6 children (2 adopted)SO- SOBRetirement- doing well. Marriage in good health. Patient has never smoked. Alcohol use- noPt gets regular exercise   Social Determinants of Health   Financial Resource Strain: Low Risk  (04/02/2022)   Overall Financial Resource Strain (CARDIA)    Difficulty of Paying Living Expenses: Not hard at all  Food Insecurity: No Food Insecurity (02/11/2023)   Hunger Vital Sign    Worried About Running Out of Food in the Last Year: Never true    Ran Out of Food in the Last Year: Never true  Transportation Needs: No Transportation Needs (02/11/2023)   PRAPARE - Administrator, Civil Service (Medical): No    Lack of Transportation (Non-Medical): No  Physical Activity: Inactive (04/02/2022)   Exercise Vital Sign    Days of Exercise per Week: 0 days    Minutes of Exercise per Session: 0 min  Stress: No Stress Concern Present (04/02/2022)   [employer] of Occupational Health - Occupational Stress Questionnaire    Feeling of Stress : Not at all  Social Connections: Socially Integrated (04/02/2022)   Social Connection and Isolation Panel [NHANES]    Frequency  of Communication with Friends and Family: More than three times a week    Frequency of Social Gatherings with Friends and Family: More than three times a week    Attends Religious Services: More than 4 times per year    Active Member of [employer] or Organizations: Yes    Attends Banker Meetings: More than 4 times per year    Marital Status:  Married  Intimate Partner Violence: Not At Risk (02/11/2023)   Humiliation, Afraid, Rape, and Kick questionnaire    Fear of Current or Ex-Partner: No    Emotionally Abused: No    Physically Abused: No    Sexually Abused: No    Family History  Problem Relation Age of Onset   Bladder Cancer Father    Diabetes Father    Prostate cancer Father    Alzheimer's disease Mother    Diabetes Sister    Lung cancer Sister         smoker   Esophageal cancer Paternal Uncle    Colon cancer Neg Hx     ROS: no fevers or chills, productive cough, hemoptysis, dysphasia, odynophagia, melena, hematochezia, dysuria, hematuria, rash, seizure activity, orthopnea, PND, pedal edema, claudication. Remaining systems are negative.  Physical Exam: Well-developed well-nourished in no acute distress.  Skin is warm and dry.  HEENT is normal.  Neck is supple.  Chest is clear to auscultation with normal expansion.  Cardiovascular exam is regular rate and rhythm.  Abdominal exam nontender or distended. No masses palpated. Extremities show no edema. neuro grossly intact  ECG- personally reviewed  A/P  1 pericardial effusion-patient has a large chronic pericardial effusion without evidence of tamponade.  There has been no indication for pericardiocentesis or pericardial window.  2 pulmonary hypertension-this is felt secondary to history of crest syndrome/interstitial lung disease as well as history of pulmonary embolus.  Patient is also followed by pulmonary.  Continue present medical regimen.  Note her Cardizem was discontinued as she was having difficulties with  hypotension.  3 aortic stenosis/mitral stenosis-plan follow-up echocardiogram March 2025.  4 hypertension-patient's blood pressure is controlled.  Continue present medical regimen.  5 AVM/GI bleeding-pt is followed by gastroenterology.  Olga Millers, MD

## 2023-06-01 ENCOUNTER — Telehealth: Payer: Self-pay | Admitting: Pulmonary Disease

## 2023-06-01 NOTE — Telephone Encounter (Signed)
I have reviewed patient's chart and see she has been seen by Rhunette Croft, NP over the past 2 weeks for on going symptoms. She has an increase in her kidney function as well. I would recommend she go to the hospital to be admitted for further evaluation. She may need IV diuresis and adjustment of her blood pressure medications.   Thanks, Dr. Francine Graven

## 2023-06-02 DIAGNOSIS — I509 Heart failure, unspecified: Secondary | ICD-10-CM | POA: Diagnosis not present

## 2023-06-02 NOTE — Telephone Encounter (Signed)
Called and spoke with patient. I went over recommendations from Dr. Francine Graven. Patient states she is unsure if she wants to go bo the hospital. I have advised patient several times that Dr. Francine Graven has recommend she be admitted for further evaluation. Patient states she will think it over and left our office know tomorrow what she has decided. I will close encounter for now since nothing else is needed.

## 2023-06-03 ENCOUNTER — Ambulatory Visit: Payer: Medicare PPO | Admitting: Pulmonary Disease

## 2023-06-03 ENCOUNTER — Encounter: Payer: Self-pay | Admitting: Pulmonary Disease

## 2023-06-03 ENCOUNTER — Other Ambulatory Visit: Payer: Self-pay | Admitting: Physician Assistant

## 2023-06-03 VITALS — BP 106/58 | HR 72 | Temp 97.2°F | Ht 63.0 in | Wt 105.0 lb

## 2023-06-03 DIAGNOSIS — I272 Pulmonary hypertension, unspecified: Secondary | ICD-10-CM | POA: Diagnosis not present

## 2023-06-03 DIAGNOSIS — J849 Interstitial pulmonary disease, unspecified: Secondary | ICD-10-CM

## 2023-06-03 DIAGNOSIS — M349 Systemic sclerosis, unspecified: Secondary | ICD-10-CM

## 2023-06-03 DIAGNOSIS — D649 Anemia, unspecified: Secondary | ICD-10-CM

## 2023-06-03 DIAGNOSIS — E538 Deficiency of other specified B group vitamins: Secondary | ICD-10-CM

## 2023-06-03 LAB — CBC WITH DIFFERENTIAL/PLATELET
Basophils Absolute: 0 10*3/uL (ref 0.0–0.1)
Basophils Relative: 0.7 % (ref 0.0–3.0)
Eosinophils Absolute: 0.1 10*3/uL (ref 0.0–0.7)
Eosinophils Relative: 1.5 % (ref 0.0–5.0)
HCT: 30.7 % — ABNORMAL LOW (ref 36.0–46.0)
Hemoglobin: 10 g/dL — ABNORMAL LOW (ref 12.0–15.0)
Lymphocytes Relative: 19.5 % (ref 12.0–46.0)
Lymphs Abs: 0.9 10*3/uL (ref 0.7–4.0)
MCHC: 32.6 g/dL (ref 30.0–36.0)
MCV: 107.1 fl — ABNORMAL HIGH (ref 78.0–100.0)
Monocytes Absolute: 0.3 10*3/uL (ref 0.1–1.0)
Monocytes Relative: 6.2 % (ref 3.0–12.0)
Neutro Abs: 3.2 10*3/uL (ref 1.4–7.7)
Neutrophils Relative %: 72.1 % (ref 43.0–77.0)
Platelets: 173 10*3/uL (ref 150.0–400.0)
RBC: 2.86 Mil/uL — ABNORMAL LOW (ref 3.87–5.11)
RDW: 12.9 % (ref 11.5–15.5)
WBC: 4.4 10*3/uL (ref 4.0–10.5)

## 2023-06-03 LAB — COMPREHENSIVE METABOLIC PANEL
ALT: 8 U/L (ref 0–35)
AST: 19 U/L (ref 0–37)
Albumin: 4.2 g/dL (ref 3.5–5.2)
Alkaline Phosphatase: 62 U/L (ref 39–117)
BUN: 36 mg/dL — ABNORMAL HIGH (ref 6–23)
CO2: 31 mEq/L (ref 19–32)
Calcium: 9.5 mg/dL (ref 8.4–10.5)
Chloride: 93 mEq/L — ABNORMAL LOW (ref 96–112)
Creatinine, Ser: 1.16 mg/dL (ref 0.40–1.20)
GFR: 43.21 mL/min — ABNORMAL LOW (ref >60.00)
Glucose, Bld: 89 mg/dL (ref 70–99)
Potassium: 4.3 mEq/L (ref 3.5–5.1)
Sodium: 132 mEq/L — ABNORMAL LOW (ref 135–145)
Total Bilirubin: 0.6 mg/dL (ref 0.2–1.2)
Total Protein: 6.7 g/dL (ref 6.0–8.3)

## 2023-06-03 LAB — BRAIN NATRIURETIC PEPTIDE: Pro B Natriuretic peptide (BNP): 2680 pg/mL — ABNORMAL HIGH (ref 0.0–100.0)

## 2023-06-03 NOTE — Patient Instructions (Addendum)
We will check your labs today and determine if you need to go to the hospital for further workup of your heart  I am concerned you have progression of heart failure and pulmonary hypertension. There could be changes with your aortic valve.  Continue current dose of medications and will notify you of adjustments once labs have returned.  I will call you once labs are back

## 2023-06-03 NOTE — Progress Notes (Unsigned)
Synopsis: Referred in November 2022 for ILD by Marsa Aris, MD  Subjective:   PATIENT ID: Paula Ward GENDER: female DOB: Aug 24, 1936, MRN: [id number]  HPI  Chief Complaint  Patient presents with   Follow-up    Breathing has not been good  SOB,Cough    Paula Ward is an 87 year old woman, never smoker with history of pulmonary hypertension, AVMs of small bowel, chronic blood loss anemia, CKD, scleroderma who returns to pulmonary clinic for ILD.   She has been seen by Rhunette Croft, NP 7/24 and 8/1 for acute visits. Her diltiazem was stopped at last visit. She continues to have dyspnea, feeling tired and feeling more cold. She does have lower extremity edema that is somewhat improved from before. She denies fevers or sweats. No cough or mucous production.   OV 09/04/22 She has been doing fine since lats visit. She reports her nasal drainage is much improved on nasal sprays. She continues opsumit 10mg  daily.   OV 05/12/22 CT Chest scan 04/23/22 showed resolution of opacities from her 02/2022 scan with stable subpleural reticulation. She has been doing ok since last visit. She does complain of on going sinus drainage.   OV 02/03/22 She has been doing well since last visit. She reports feeling better after an increase in her hemoglobin from 9 to 12g/dL with epo injections. She is also receiving iron infusions.   She has followed up at [facility] in June.   PFTs today show moderate restriction and moderate diffusion defect.   OV 08/28/21 She has been followed by Dr. Reina Fuse for ILD and Dr. Ramiro Harvest for pulmonary hypertension related to her scleroderma at the [facility] pulmonary clinic.  She has been referred to our pulmonary clinic to establish care as it is getting more difficult for her to travel to [place].  At this time she would like to continue care at [facility] but also be established with our clinic in case of an emergency.  She reports she has progressive shortness of breath especially  with exertion.  She was previously on immunosuppression for treatment of her interstitial lung disease related to scleroderma but she did not tolerate this treatment which was discontinued due to side effects.  Antifibrotic therapy was discussed on her previous visits but has not been initiated due to the side effect profile.  She remains on macitentan for her pulmonary hypertension.  She was previously on supplemental oxygen therapy but based on her 6-minute walk tests at [facility] she was able to be weaned off oxygen at rest and with exertion.  She denies any chest pain, syncopal episodes or lower extremity edema.  She is a never smoker.  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  She lives at home with her husband.  Past Medical History:  Diagnosis Date   Anemia    Angiodysplasia of stomach and duodenum    Arthus phenomenon    AVM (arteriovenous malformation)    Blood transfusion without reported diagnosis    last 4 units 12-22-15, Iron infusion x2 last -01-07-16,01-14-16.   Breast cancer (HCC) 1989   Left   Candida esophagitis (HCC)    Cataract    Chronic kidney disease    Chronic mild renal insuffiency   CREST syndrome (HCC)    GERD (gastroesophageal reflux disease)    w/ HH   Interstitial lung disease (HCC)    Nodule of kidney    Pericardial effusion  PONV (postoperative nausea and vomiting)    Pulmonary embolus (HCC) 2003   Pulmonary hypertension (HCC)    followed by Dr Bland Span at [facility], now Dr. Penni Bombard visit end 2'17.   Rectal incontinence    Renal cell carcinoma (HCC)    Scleroderma (HCC)    Tubular adenoma of colon    Uterine polyp      Family History  Problem Relation Age of Onset   Bladder Cancer Father    Diabetes Father    Prostate cancer Father    Alzheimer's disease Mother    Diabetes Sister    Lung cancer Sister         smoker   Esophageal cancer Paternal Uncle     Colon cancer Neg Hx      Social History   Socioeconomic History   Marital status: Married    Spouse name: Not on file   Number of children: 2   Years of education: Not on file   Highest education level: Not on file  Occupational History   Occupation: Retired  Tobacco Use   Smoking status: Never   Smokeless tobacco: Never  Vaping Use   Vaping status: Never Used  Substance and Sexual Activity   Alcohol use: No   Drug use: No   Sexual activity: Not Currently  Other Topics Concern   Not on file  Social History Narrative   Married '611 son- '65, 1 daughter '63; 6 children (2 adopted)SO- SOBRetirement- doing well. Marriage in good health. Patient has never smoked. Alcohol use- noPt gets regular exercise   Social Determinants of Health   Financial Resource Strain: Low Risk  (04/02/2022)   Overall Financial Resource Strain (CARDIA)    Difficulty of Paying Living Expenses: Not hard at all  Food Insecurity: No Food Insecurity (02/11/2023)   Hunger Vital Sign    Worried About Running Out of Food in the Last Year: Never true    Ran Out of Food in the Last Year: Never true  Transportation Needs: No Transportation Needs (02/11/2023)   PRAPARE - Administrator, Civil Service (Medical): No    Lack of Transportation (Non-Medical): No  Physical Activity: Inactive (04/02/2022)   Exercise Vital Sign    Days of Exercise per Week: 0 days    Minutes of Exercise per Session: 0 min  Stress: No Stress Concern Present (04/02/2022)   [employer] of Occupational Health - Occupational Stress Questionnaire    Feeling of Stress : Not at all  Social Connections: Socially Integrated (04/02/2022)   Social Connection and Isolation Panel [NHANES]    Frequency of Communication with Friends and Family: More than three times a week    Frequency of Social Gatherings with Friends and Family: More than three times a week    Attends Religious Services: More than 4 times per year    Active  Member of [employer] or Organizations: Yes    Attends Engineer, structural: More than 4 times per year    Marital Status: Married  Catering manager Violence: Not At Risk (02/11/2023)   Humiliation, Afraid, Rape, and Kick questionnaire    Fear of Current or Ex-Partner: No    Emotionally Abused: No    Physically Abused: No    Sexually Abused: No     Allergies  Allergen Reactions   Codeine Nausea Only    Hallucinations, too   Other Nausea And Vomiting    "-mycin" antibiotics.   Also cause hallucinations.   Erythromycin Nausea  And Vomiting   Iodinated Contrast Media Other (See Comments)    Renal issues   Lisinopril     Pt doesn't remember    Mycophenolate Mofetil Nausea Only   Sulfa Antibiotics Other (See Comments)    unknown     Outpatient Medications Prior to Visit  Medication Sig Dispense Refill   acetaminophen (TYLENOL) 325 MG tablet Take 2 tablets (650 mg total) by mouth every 6 (six) hours as needed for moderate pain. 30 tablet 0   albuterol (VENTOLIN HFA) 108 (90 Base) MCG/ACT inhaler INHALE 2 PUFFS INTO THE LUNGS EVERY 6 HOURS AS NEEDED FOR WHEEZING OR SHORTNESS OF BREATH 6.7 g 3   citalopram (CELEXA) 10 MG tablet Take 1 tablet (10 mg total) by mouth daily. (Patient taking differently: Take 10 mg by mouth daily as needed (depression).) 30 tablet 3   Cyanocobalamin (VITAMIN B-12) 1000 MCG SUBL Place 1,000 mcg under the tongue. Taking twice a week     diltiazem (CARDIZEM SR) 60 MG 12 hr capsule Take 60 mg by mouth daily.     docusate sodium (COLACE) 100 MG capsule Take 1 capsule (100 mg total) by mouth 2 (two) times daily. 10 capsule 0   famotidine (PEPCID) 20 MG tablet Take 1 tablet (20 mg total) by mouth at bedtime. 30 tablet 11   fluticasone (FLONASE) 50 MCG/ACT nasal spray Place 1 spray into both nostrils daily as needed for allergies. (Patient taking differently: Place 1 spray into both nostrils in the morning and at bedtime.) 48 g 0   furosemide (LASIX) 20 MG tablet  Take 1 tablet by mouth once daily 90 tablet 0   guaiFENesin (MUCINEX) 600 MG 12 hr tablet Take 300 mg by mouth daily.     ipratropium (ATROVENT) 0.03 % nasal spray Place 2 sprays into both nostrils every 12 (twelve) hours. 30 mL 12   latanoprost (XALATAN) 0.005 % ophthalmic solution Place 1 drop into both eyes at bedtime.     magic mouthwash SOLN Take 10 mLs by mouth 4 (four) times daily as needed for mouth pain. 15 mL 2   mirtazapine (REMERON) 15 MG tablet Take 1 tablet (15 mg total) by mouth at bedtime. 30 tablet 3   montelukast (SINGULAIR) 10 MG tablet TAKE 1 TABLET BY MOUTH AT BEDTIME 90 tablet 0   Multiple Vitamin (MULTIVITAMIN) capsule Take 1 capsule by mouth daily.     mupirocin ointment (BACTROBAN) 2 % Apply 1 Application topically 2 (two) times daily as needed (dry skin).     ondansetron (ZOFRAN) 4 MG tablet Take 1 tablet (4 mg total) by mouth every 6 (six) hours as needed for nausea. 20 tablet 0   OPSUMIT 10 MG TABS Take 10 mg by mouth daily.     OXYGEN Inhale into the lungs. 2 LMP at night and upon exertion     pantoprazole (PROTONIX) 40 MG tablet Take 1 tablet (40 mg total) by mouth daily. 30 tablet 11   polyethylene glycol (MIRALAX / GLYCOLAX) 17 g packet Take 17 g by mouth daily. 14 each 0   spironolactone (ALDACTONE) 25 MG tablet Take 0.5 tablets (12.5 mg total) by mouth daily. 45 tablet 3   sucralfate (CARAFATE) 1 GM/10ML suspension TAKE 10 MLS BY MOUTH EVERY 6 HOURS AS NEEDED FOR REFLUX 420 mL 0   diltiazem (CARDIZEM CD) 120 MG 24 hr capsule Take 1 capsule (120 mg total) by mouth daily. (Patient not taking: Reported on 06/03/2023) 90 capsule 3   No facility-administered medications prior  to visit.    Review of Systems  Constitutional:  Negative for chills, fever, malaise/fatigue and weight loss.  HENT:  Negative for congestion, sinus pain and sore throat.   Eyes: Negative.   Respiratory:  Positive for shortness of breath. Negative for cough, hemoptysis, sputum production and  wheezing.   Cardiovascular:  Positive for leg swelling. Negative for chest pain, palpitations, orthopnea and claudication.  Gastrointestinal:  Negative for abdominal pain, heartburn, nausea and vomiting.  Genitourinary: Negative.   Musculoskeletal:  Negative for joint pain and myalgias.  Skin:  Negative for rash.  Neurological:  Negative for weakness.  Endo/Heme/Allergies: Negative.   Psychiatric/Behavioral: Negative.      Objective:   Vitals:   06/03/23 1128  BP: (!) 106/58  Pulse: 72  Temp: (!) 97.2 F (36.2 C)  TempSrc: Temporal  SpO2: 92%  Weight: 105 lb (47.6 kg)  Height: 5\' 3"  (1.6 m)   Physical Exam Constitutional:      General: She is not in acute distress.    Appearance: She is ill-appearing.  HENT:     Head: Normocephalic and atraumatic.  Eyes:     General: No scleral icterus.    Conjunctiva/sclera: Conjunctivae normal.  Cardiovascular:     Rate and Rhythm: Normal rate and regular rhythm.     Pulses: Normal pulses.     Heart sounds: Murmur (systolic) heard.  Pulmonary:     Effort: Pulmonary effort is normal.     Breath sounds: Rales (bibasilar) present. No wheezing or rhonchi.  Musculoskeletal:     Right lower leg: Edema present.     Left lower leg: Edema present.  Lymphadenopathy:     Cervical: No cervical adenopathy.  Skin:    General: Skin is warm and dry.  Neurological:     General: No focal deficit present.     Mental Status: She is alert.  Psychiatric:        Mood and Affect: Mood normal.        Behavior: Behavior normal.        Thought Content: Thought content normal.        Judgment: Judgment normal.    CBC    Component Value Date/Time   WBC 4.4 06/03/2023 1200   RBC 2.86 (L) 06/03/2023 1200   HGB 10.0 (L) 06/03/2023 1200   HGB 10.3 (L) 03/25/2023 0938   HGB 11.2 04/21/2022 0940   HCT 30.7 (L) 06/03/2023 1200   HCT 32.7 (L) 04/21/2022 0940   PLT 173.0 06/03/2023 1200   PLT 181 03/25/2023 0938   PLT 242 04/21/2022 0940   MCV 107.1  (H) 06/03/2023 1200   MCV 104 (H) 04/21/2022 0940   MCH 35.0 (H) 03/25/2023 0938   MCHC 32.6 06/03/2023 1200   RDW 12.9 06/03/2023 1200   RDW 13.1 04/21/2022 0940   LYMPHSABS 0.9 06/03/2023 1200   MONOABS 0.3 06/03/2023 1200   EOSABS 0.1 06/03/2023 1200   BASOSABS 0.0 06/03/2023 1200      Latest Ref Rng & Units 06/03/2023   12:00 PM 05/21/2023    2:38 PM 05/13/2023    3:07 PM  BMP  Glucose 70 - 99 mg/dL 89  295  89   BUN 6 - 23 mg/dL 36  41  37   Creatinine 0.40 - 1.20 mg/dL 2.84  1.32  4.40   Sodium 135 - 145 mEq/L 132  132  134   Potassium 3.5 - 5.1 mEq/L 4.3  4.4  4.9   Chloride 96 - 112  mEq/L 93  93  95   CO2 19 - 32 mEq/L 31  31  30    Calcium 8.4 - 10.5 mg/dL 9.5  9.2  9.7    Chest imaging: CT Chest 04/23/22 1. Marked interval improvement in the patchy and confluent areas of ground-glass opacity seen previously with resolution in some regions. 2. Dependent consolidative airspace disease seen previously in the lower lobes has largely resolved in the interval. 3. Subpleural reticulation bilaterally is similar, compatible with underlying chronic interstitial lung disease. 4. Similar moderate to large pericardial effusion. 5. Enlargement of the pulmonary outflow tract/main pulmonary arteries suggests pulmonary arterial hypertension. 6. 7 mm right upper lobe pulmonary nodule is stable back to 03/30/2021. 7. Similar left-sided pleural effusion. 8. Aortic Atherosclerosis   CTA Chest 03/17/22 1. No evidence for pulmonary embolism. 2. Large pericardial effusion. 3. Mild cardiomegaly. 4. Small bilateral pleural effusions. 5. Bilateral lower lobe airspace disease compatible with infection. 6. Ground-glass opacities in both lungs favored as edema, although infection is not excluded. Some ground-glass areas are nodular. 7. A follow-up chest CT is recommended in 3 months to re-evaluate and confirm resolution.  CT Chest 03/30/21 Cardiovascular: Large pericardial effusion.  Cardiomegaly. Atherosclerotic calcifications of the aorta. Aortic valve calcifications.   Mediastinum/Nodes: Thyroid is unremarkable. No axillary adenopathy. Status post LEFT axillary node sampling. No definitive mediastinal adenopathy.   Lungs/Pleura: Small to moderate LEFT pleural effusion. Trace RIGHT pleural effusion. Subpleural reticulation bilaterally. Anteriorly located subpleural reticulation of the LEFT breast likely reflecting sequela of radiation related changes. Irregular RIGHT apical pulmonary nodule versus scar measures 6 x 3 mm (series 7, image 27). Several adjacent RIGHT middle lobe pulmonary nodules each measure approximately 4 mm (series 7, image 104). LEFT lower lobe consolidative opacity, nonspecific. RIGHT basilar linear opacity most consistent with atelectasis.  HRCT Chest 04/12/2019 1. Findings suggestive of a probable UIP pattern fibrosis, which appears  similar to slightly progressed since 2012 and is likely related to the  patient's known underlying connective tissue disorder.  2. Small pulmonary nodules measuring up to 6 mm. Recommend 12 month follow  up chest CT to assess stability, per 2017 Fleischner criteria.  3, Large pericardial effusion. This finding is known per clinic note dated  12/23/2018 and has been documented on a recent echo.   PFT:    Latest Ref Rng & Units 02/03/2022   11:01 AM  PFT Results  FVC-Pre L 1.34   FVC-Predicted Pre % 56   Pre FEV1/FVC % % 84   FEV1-Pre L 1.12   FEV1-Predicted Pre % 64   DLCO uncorrected ml/min/mmHg 10.32   DLCO UNC% % 57   DLCO corrected ml/min/mmHg 11.49   DLCO COR %Predicted % 63   DLVA Predicted % 119   TLC L 2.88   TLC % Predicted % 58   RV % Predicted % 62     PFT 02/28/21 FEV1/FVC 72%, FEV 1.03 (57%), FVC 1.42L (59%), DLCO 41%  PFT 03/01/2020 FEV1/FVC 73%, FEV1 1.25L (65%), FVC 1.25L (68%), DLCO 37.3%  08/2020 Six minute walk distance is within the normal predicted range.  Oxygenation  during exercise was adequate (SpO2 =>90%) without the use of supplemental O2. At peak exercise, the heart rate indicated cardiovascular maximums were being approached.  Perceived dyspnea at end of exercise was absent or mild.  Labs:  Path:  Echo 02/28/21: NORMAL LEFT VENTRICULAR SYSTOLIC FUNCTION WITH MILD LVH    NORMAL RIGHT VENTRICULAR SYSTOLIC FUNCTION    VALVULAR REGURGITATION: TRIVIAL MR, MILD  TR    VALVULAR STENOSIS: MODERATE AS, MILD MS    LARGE PERICARDIAL EFFUSION (See above)   Heart Catheterization: Cardiac catheterization was 02/07/08. It looked quite good with a PAP of 35/11 (mean 22), mean RAP of 3, PVR = 1.7 wood units, cardiac index = 3.69 L/min/m2, PCWP of 11. Cardiac catheterization in September 2004 prior to therapy revealed an RA pressure was 5, PA pressure 60/15 with a mean of 35, cardiac index 3.2, and PVR 5. The next cath on 10/03/2003, which was performed on iloprost / placebo, revealed a RA pressure of 2, PA pressure of 55/17 with a mean of 32, PCWP of 5, cardiac index of 3.1-3.7 and PVR between 3 and 4.    Assessment & Plan:   Pulmonary hypertension (HCC) - Plan: B Nat Peptide, CBC with Differential/Platelet, Comp Met (CMET), Comp Met (CMET), CBC with Differential/Platelet, B Nat Peptide, CANCELED: CBC with Differential/Platelet, CANCELED: Comp Met (CMET), CANCELED: B Nat Peptide  ILD (interstitial lung disease) (HCC)  Scleroderma (HCC)  Discussion: Paula Ward is an 87 year old woman, never smoker with history of pulmonary hypertension, AVMs of small bowel, chronic blood loss anemia, CKD, scleroderma who returns to pulmonary clinic for ILD.   She is to continue macitentan at this time. She is to continue lasix and spironolactone for diuresis.   We will check stat labs today. I am concerned about worsening pulmonary hypertension vs aortic stenosis leading to symptomatic heart failure. She had renal insufficiency earlier this month, so possible worsening in her  renal function as well. She will likely require hospital admission for further cardiac workup.    From prior visits: She does appear to have progressive fibrotic changes based on her CT chest imaging and she also has a progressive decline in her pulmonary function tests, but after todays PFT testing, they appear stable at this time.  Antifibrotic therapy has been discussed previously at her visits at [facility] and not initiated due to the side effect profile.  She is not currently requiring supplemental oxygen at this time, only at night.   Melody Comas, MD [facility] Office: [phone]   Current Outpatient Medications:    acetaminophen (TYLENOL) 325 MG tablet, Take 2 tablets (650 mg total) by mouth every 6 (six) hours as needed for moderate pain., Disp: 30 tablet, Rfl: 0   albuterol (VENTOLIN HFA) 108 (90 Base) MCG/ACT inhaler, INHALE 2 PUFFS INTO THE LUNGS EVERY 6 HOURS AS NEEDED FOR WHEEZING OR SHORTNESS OF BREATH, Disp: 6.7 g, Rfl: 3   citalopram (CELEXA) 10 MG tablet, Take 1 tablet (10 mg total) by mouth daily. (Patient taking differently: Take 10 mg by mouth daily as needed (depression).), Disp: 30 tablet, Rfl: 3   Cyanocobalamin (VITAMIN B-12) 1000 MCG SUBL, Place 1,000 mcg under the tongue. Taking twice a week, Disp: , Rfl:    diltiazem (CARDIZEM SR) 60 MG 12 hr capsule, Take 60 mg by mouth daily., Disp: , Rfl:    docusate sodium (COLACE) 100 MG capsule, Take 1 capsule (100 mg total) by mouth 2 (two) times daily., Disp: 10 capsule, Rfl: 0   famotidine (PEPCID) 20 MG tablet, Take 1 tablet (20 mg total) by mouth at bedtime., Disp: 30 tablet, Rfl: 11   fluticasone (FLONASE) 50 MCG/ACT nasal spray, Place 1 spray into both nostrils daily as needed for allergies. (Patient taking differently: Place 1 spray into both nostrils in the morning and at bedtime.), Disp: 48 g, Rfl: 0   furosemide (LASIX) 20  MG tablet, Take 1 tablet by mouth once daily, Disp: 90 tablet, Rfl: 0    guaiFENesin (MUCINEX) 600 MG 12 hr tablet, Take 300 mg by mouth daily., Disp: , Rfl:    ipratropium (ATROVENT) 0.03 % nasal spray, Place 2 sprays into both nostrils every 12 (twelve) hours., Disp: 30 mL, Rfl: 12   latanoprost (XALATAN) 0.005 % ophthalmic solution, Place 1 drop into both eyes at bedtime., Disp: , Rfl:    magic mouthwash SOLN, Take 10 mLs by mouth 4 (four) times daily as needed for mouth pain., Disp: 15 mL, Rfl: 2   mirtazapine (REMERON) 15 MG tablet, Take 1 tablet (15 mg total) by mouth at bedtime., Disp: 30 tablet, Rfl: 3   montelukast (SINGULAIR) 10 MG tablet, TAKE 1 TABLET BY MOUTH AT BEDTIME, Disp: 90 tablet, Rfl: 0   Multiple Vitamin (MULTIVITAMIN) capsule, Take 1 capsule by mouth daily., Disp: , Rfl:    mupirocin ointment (BACTROBAN) 2 %, Apply 1 Application topically 2 (two) times daily as needed (dry skin)., Disp: , Rfl:    ondansetron (ZOFRAN) 4 MG tablet, Take 1 tablet (4 mg total) by mouth every 6 (six) hours as needed for nausea., Disp: 20 tablet, Rfl: 0   OPSUMIT 10 MG TABS, Take 10 mg by mouth daily., Disp: , Rfl:    OXYGEN, Inhale into the lungs. 2 LMP at night and upon exertion, Disp: , Rfl:    pantoprazole (PROTONIX) 40 MG tablet, Take 1 tablet (40 mg total) by mouth daily., Disp: 30 tablet, Rfl: 11   polyethylene glycol (MIRALAX / GLYCOLAX) 17 g packet, Take 17 g by mouth daily., Disp: 14 each, Rfl: 0   spironolactone (ALDACTONE) 25 MG tablet, Take 0.5 tablets (12.5 mg total) by mouth daily., Disp: 45 tablet, Rfl: 3   sucralfate (CARAFATE) 1 GM/10ML suspension, TAKE 10 MLS BY MOUTH EVERY 6 HOURS AS NEEDED FOR REFLUX, Disp: 420 mL, Rfl: 0   diltiazem (CARDIZEM CD) 120 MG 24 hr capsule, Take 1 capsule (120 mg total) by mouth daily. (Patient not taking: Reported on 06/03/2023), Disp: 90 capsule, Rfl: 3

## 2023-06-04 ENCOUNTER — Inpatient Hospital Stay: Payer: Medicare PPO

## 2023-06-04 ENCOUNTER — Inpatient Hospital Stay: Payer: Medicare PPO | Admitting: Physician Assistant

## 2023-06-04 ENCOUNTER — Encounter: Payer: Self-pay | Admitting: Pulmonary Disease

## 2023-06-04 ENCOUNTER — Telehealth: Payer: Self-pay | Admitting: Pulmonary Disease

## 2023-06-04 ENCOUNTER — Other Ambulatory Visit: Payer: Self-pay

## 2023-06-04 ENCOUNTER — Inpatient Hospital Stay: Payer: Medicare PPO | Attending: Physician Assistant

## 2023-06-04 ENCOUNTER — Emergency Department (HOSPITAL_COMMUNITY): Payer: Medicare PPO

## 2023-06-04 ENCOUNTER — Inpatient Hospital Stay (HOSPITAL_COMMUNITY)
Admission: EM | Admit: 2023-06-04 | Discharge: 2023-06-06 | DRG: 291 | Disposition: A | Discharge status: Home / Self Care | Payer: Medicare PPO | Attending: Internal Medicine | Admitting: Internal Medicine

## 2023-06-04 DIAGNOSIS — Z66 Do not resuscitate: Secondary | ICD-10-CM | POA: Diagnosis present

## 2023-06-04 DIAGNOSIS — D509 Iron deficiency anemia, unspecified: Secondary | ICD-10-CM | POA: Diagnosis present

## 2023-06-04 DIAGNOSIS — M341 CR(E)ST syndrome: Secondary | ICD-10-CM | POA: Diagnosis present

## 2023-06-04 DIAGNOSIS — I509 Heart failure, unspecified: Secondary | ICD-10-CM

## 2023-06-04 DIAGNOSIS — Z882 Allergy status to sulfonamides status: Secondary | ICD-10-CM

## 2023-06-04 DIAGNOSIS — R54 Age-related physical debility: Secondary | ICD-10-CM | POA: Diagnosis present

## 2023-06-04 DIAGNOSIS — Z885 Allergy status to narcotic agent status: Secondary | ICD-10-CM

## 2023-06-04 DIAGNOSIS — Z888 Allergy status to other drugs, medicaments and biological substances status: Secondary | ICD-10-CM | POA: Diagnosis not present

## 2023-06-04 DIAGNOSIS — J8410 Pulmonary fibrosis, unspecified: Secondary | ICD-10-CM | POA: Diagnosis present

## 2023-06-04 DIAGNOSIS — Z86010 Personal history of colonic polyps: Secondary | ICD-10-CM

## 2023-06-04 DIAGNOSIS — D539 Nutritional anemia, unspecified: Secondary | ICD-10-CM | POA: Diagnosis present

## 2023-06-04 DIAGNOSIS — N1832 Chronic kidney disease, stage 3b: Secondary | ICD-10-CM | POA: Diagnosis present

## 2023-06-04 DIAGNOSIS — I2489 Other forms of acute ischemic heart disease: Secondary | ICD-10-CM | POA: Diagnosis not present

## 2023-06-04 DIAGNOSIS — E538 Deficiency of other specified B group vitamins: Secondary | ICD-10-CM | POA: Diagnosis present

## 2023-06-04 DIAGNOSIS — K219 Gastro-esophageal reflux disease without esophagitis: Secondary | ICD-10-CM | POA: Diagnosis present

## 2023-06-04 DIAGNOSIS — I959 Hypotension, unspecified: Secondary | ICD-10-CM | POA: Diagnosis present

## 2023-06-04 DIAGNOSIS — I2729 Other secondary pulmonary hypertension: Secondary | ICD-10-CM | POA: Diagnosis present

## 2023-06-04 DIAGNOSIS — I130 Hypertensive heart and chronic kidney disease with heart failure and stage 1 through stage 4 chronic kidney disease, or unspecified chronic kidney disease: Secondary | ICD-10-CM | POA: Diagnosis not present

## 2023-06-04 DIAGNOSIS — R0602 Shortness of breath: Secondary | ICD-10-CM | POA: Diagnosis not present

## 2023-06-04 DIAGNOSIS — I5033 Acute on chronic diastolic (congestive) heart failure: Secondary | ICD-10-CM | POA: Diagnosis not present

## 2023-06-04 DIAGNOSIS — Z9981 Dependence on supplemental oxygen: Secondary | ICD-10-CM

## 2023-06-04 DIAGNOSIS — Z8042 Family history of malignant neoplasm of prostate: Secondary | ICD-10-CM

## 2023-06-04 DIAGNOSIS — E871 Hypo-osmolality and hyponatremia: Secondary | ICD-10-CM | POA: Diagnosis not present

## 2023-06-04 DIAGNOSIS — Z8052 Family history of malignant neoplasm of bladder: Secondary | ICD-10-CM

## 2023-06-04 DIAGNOSIS — Z85528 Personal history of other malignant neoplasm of kidney: Secondary | ICD-10-CM

## 2023-06-04 DIAGNOSIS — E8770 Fluid overload, unspecified: Secondary | ICD-10-CM | POA: Diagnosis present

## 2023-06-04 DIAGNOSIS — J849 Interstitial pulmonary disease, unspecified: Secondary | ICD-10-CM | POA: Diagnosis present

## 2023-06-04 DIAGNOSIS — J9610 Chronic respiratory failure, unspecified whether with hypoxia or hypercapnia: Secondary | ICD-10-CM | POA: Diagnosis not present

## 2023-06-04 DIAGNOSIS — Z820 Family history of epilepsy and other diseases of the nervous system: Secondary | ICD-10-CM

## 2023-06-04 DIAGNOSIS — Z79899 Other long term (current) drug therapy: Secondary | ICD-10-CM | POA: Diagnosis not present

## 2023-06-04 DIAGNOSIS — Z800 Family history of malignant neoplasm of digestive organs: Secondary | ICD-10-CM

## 2023-06-04 DIAGNOSIS — I272 Pulmonary hypertension, unspecified: Secondary | ICD-10-CM | POA: Diagnosis not present

## 2023-06-04 DIAGNOSIS — I083 Combined rheumatic disorders of mitral, aortic and tricuspid valves: Secondary | ICD-10-CM | POA: Diagnosis present

## 2023-06-04 DIAGNOSIS — Z801 Family history of malignant neoplasm of trachea, bronchus and lung: Secondary | ICD-10-CM

## 2023-06-04 DIAGNOSIS — I5031 Acute diastolic (congestive) heart failure: Secondary | ICD-10-CM | POA: Diagnosis not present

## 2023-06-04 DIAGNOSIS — Z833 Family history of diabetes mellitus: Secondary | ICD-10-CM

## 2023-06-04 DIAGNOSIS — Z9841 Cataract extraction status, right eye: Secondary | ICD-10-CM

## 2023-06-04 DIAGNOSIS — Z86711 Personal history of pulmonary embolism: Secondary | ICD-10-CM

## 2023-06-04 DIAGNOSIS — Z91041 Radiographic dye allergy status: Secondary | ICD-10-CM

## 2023-06-04 DIAGNOSIS — Z853 Personal history of malignant neoplasm of breast: Secondary | ICD-10-CM

## 2023-06-04 DIAGNOSIS — Z9842 Cataract extraction status, left eye: Secondary | ICD-10-CM

## 2023-06-04 DIAGNOSIS — Z881 Allergy status to other antibiotic agents status: Secondary | ICD-10-CM

## 2023-06-04 DIAGNOSIS — N183 Chronic kidney disease, stage 3 unspecified: Secondary | ICD-10-CM | POA: Diagnosis present

## 2023-06-04 DIAGNOSIS — I517 Cardiomegaly: Secondary | ICD-10-CM | POA: Diagnosis not present

## 2023-06-04 DIAGNOSIS — R0789 Other chest pain: Secondary | ICD-10-CM | POA: Diagnosis not present

## 2023-06-04 DIAGNOSIS — I3139 Other pericardial effusion (noninflammatory): Secondary | ICD-10-CM | POA: Diagnosis not present

## 2023-06-04 LAB — BASIC METABOLIC PANEL
Anion gap: 13 (ref 5–15)
BUN: 34 mg/dL — ABNORMAL HIGH (ref 8–23)
CO2: 24 mmol/L (ref 22–32)
Calcium: 8.9 mg/dL (ref 8.9–10.3)
Chloride: 96 mmol/L — ABNORMAL LOW (ref 98–111)
Creatinine, Ser: 1.26 mg/dL — ABNORMAL HIGH (ref 0.44–1.00)
GFR, Estimated: 42 mL/min — ABNORMAL LOW (ref >60)
Glucose, Bld: 92 mg/dL (ref 70–99)
Potassium: 4.7 mmol/L (ref 3.5–5.1)
Sodium: 133 mmol/L — ABNORMAL LOW (ref 135–145)

## 2023-06-04 LAB — TROPONIN I (HIGH SENSITIVITY)
Troponin I (High Sensitivity): 19 ng/L — ABNORMAL HIGH (ref <18)
Troponin I (High Sensitivity): 22 ng/L — ABNORMAL HIGH (ref <18)

## 2023-06-04 LAB — CBC
HCT: 30 % — ABNORMAL LOW (ref 36.0–46.0)
Hemoglobin: 9.4 g/dL — ABNORMAL LOW (ref 12.0–15.0)
MCH: 34.9 pg — ABNORMAL HIGH (ref 26.0–34.0)
MCHC: 31.3 g/dL (ref 30.0–36.0)
MCV: 111.5 fL — ABNORMAL HIGH (ref 80.0–100.0)
Platelets: 172 10*3/uL (ref 150–400)
RBC: 2.69 MIL/uL — ABNORMAL LOW (ref 3.87–5.11)
RDW: 12.4 % (ref 11.5–15.5)
WBC: 6.1 10*3/uL (ref 4.0–10.5)
nRBC: 0 % (ref 0.0–0.2)

## 2023-06-04 LAB — BRAIN NATRIURETIC PEPTIDE: B Natriuretic Peptide: 1976.3 pg/mL — ABNORMAL HIGH (ref 0.0–100.0)

## 2023-06-04 MED ORDER — MACITENTAN 10 MG PO TABS
10.0000 mg | ORAL_TABLET | Freq: Every day | ORAL | Status: DC
  Administered 2023-06-05 – 2023-06-06 (×2): 10 mg via ORAL
  Filled 2023-06-04 (×2): qty 1

## 2023-06-04 MED ORDER — IPRATROPIUM BROMIDE 0.06 % NA SOLN
2.0000 | Freq: Two times a day (BID) | NASAL | Status: DC
  Administered 2023-06-05 – 2023-06-06 (×4): 2 via NASAL
  Filled 2023-06-04: qty 15

## 2023-06-04 MED ORDER — MIRTAZAPINE 15 MG PO TABS
15.0000 mg | ORAL_TABLET | Freq: Every day | ORAL | Status: DC
  Administered 2023-06-05: 15 mg via ORAL
  Filled 2023-06-04 (×2): qty 1

## 2023-06-04 MED ORDER — FAMOTIDINE 20 MG PO TABS
20.0000 mg | ORAL_TABLET | Freq: Every day | ORAL | Status: DC
  Administered 2023-06-05 (×2): 20 mg via ORAL
  Filled 2023-06-04 (×2): qty 1

## 2023-06-04 MED ORDER — PANTOPRAZOLE SODIUM 40 MG PO TBEC
40.0000 mg | DELAYED_RELEASE_TABLET | Freq: Every day | ORAL | Status: DC
  Administered 2023-06-05 – 2023-06-06 (×2): 40 mg via ORAL
  Filled 2023-06-04 (×2): qty 1

## 2023-06-04 MED ORDER — LATANOPROST 0.005 % OP SOLN
1.0000 [drp] | Freq: Every day | OPHTHALMIC | Status: DC
  Administered 2023-06-05 (×2): 1 [drp] via OPHTHALMIC
  Filled 2023-06-04: qty 2.5

## 2023-06-04 MED ORDER — ACETAMINOPHEN 325 MG PO TABS: 650.0000 mg | ORAL_TABLET | Freq: Four times a day (QID) | ORAL | Status: DC | PRN

## 2023-06-04 MED ORDER — FUROSEMIDE 10 MG/ML IJ SOLN
40.0000 mg | Freq: Once | INTRAMUSCULAR | Status: AC
  Administered 2023-06-04: 40 mg via INTRAVENOUS
  Filled 2023-06-04: qty 4

## 2023-06-04 MED ORDER — MACITENTAN 10 MG PO TABS: 10.0000 mg | ORAL_TABLET | Freq: Every day | ORAL | Status: DC

## 2023-06-04 MED ORDER — IPRATROPIUM BROMIDE 0.03 % NA SOLN
2.0000 | Freq: Two times a day (BID) | NASAL | Status: DC
  Filled 2023-06-04 (×3): qty 30

## 2023-06-04 MED ORDER — ACETAMINOPHEN 650 MG RE SUPP: 650.0000 mg | Freq: Four times a day (QID) | RECTAL | Status: DC | PRN

## 2023-06-04 MED ORDER — ENOXAPARIN SODIUM 30 MG/0.3ML IJ SOSY
30.0000 mg | PREFILLED_SYRINGE | INTRAMUSCULAR | Status: DC
  Administered 2023-06-05 – 2023-06-06 (×2): 30 mg via SUBCUTANEOUS
  Filled 2023-06-04 (×2): qty 0.3

## 2023-06-04 MED ORDER — DOCUSATE SODIUM 100 MG PO CAPS
100.0000 mg | ORAL_CAPSULE | Freq: Two times a day (BID) | ORAL | Status: DC
  Administered 2023-06-05 – 2023-06-06 (×3): 100 mg via ORAL
  Filled 2023-06-04 (×3): qty 1

## 2023-06-04 MED ORDER — MONTELUKAST SODIUM 10 MG PO TABS
10.0000 mg | ORAL_TABLET | Freq: Every day | ORAL | Status: DC
  Administered 2023-06-05 (×2): 10 mg via ORAL
  Filled 2023-06-04 (×2): qty 1

## 2023-06-04 NOTE — ED Notes (Signed)
ED TO INPATIENT HANDOFF REPORT  ED Nurse Name and Phone #: Raquel Sarna [phone]  S Name/Age/Gender Paula Ward 87 y.o. female Room/Bed: 002C/002C  Code Status   Code Status: Prior  Home/SNF/Other Home Patient oriented to: self, place, time, and situation Is this baseline? Yes   Triage Complete: Triage complete  Chief Complaint Volume overload [E87.70]  Triage Note Pt presents with ShOB and chest tightness x 2-3 weeks. Pt has associated leg swelling, and orthopnea. Pt's pulmonologist increased pt's Lasix for a few days and pt saw improvement, but symptoms returned.    Allergies Allergies  Allergen Reactions   Codeine Nausea Only    Hallucinations, too   Other Nausea And Vomiting    "-mycin" antibiotics.   Also cause hallucinations.   Erythromycin Nausea And Vomiting   Iodinated Contrast Media Other (See Comments)    Renal issues   Lisinopril     Pt doesn't remember    Mycophenolate Mofetil Nausea Only   Sulfa Antibiotics Other (See Comments)    unknown    Level of Care/Admitting Diagnosis ED Disposition     ED Disposition  Admit   Condition  --   Comment  Hospital Area: [facility] [[id number]]  Level of Care: Telemetry Cardiac [[id number]]  May place patient in observation at [facility] or [facility] if equivalent level of care is available:: Yes  Covid Evaluation: Asymptomatic - no recent exposure (last 10 days) testing not required  Diagnosis: Volume overload [784696]  Admitting Physician: John Giovanni [[id number]]  Attending Physician: John Giovanni [[id number]]          B Medical/Surgery History Past Medical History:  Diagnosis Date   Anemia    Angiodysplasia of stomach and duodenum    Arthus phenomenon    AVM (arteriovenous malformation)    Blood transfusion without reported diagnosis    last 4 units 12-22-15, Iron infusion x2 last -01-07-16,01-14-16.   Breast cancer (HCC) 1989   Left   Candida esophagitis (HCC)    Cataract     Chronic kidney disease    Chronic mild renal insuffiency   CREST syndrome (HCC)    GERD (gastroesophageal reflux disease)    w/ HH   Interstitial lung disease (HCC)    Nodule of kidney    Pericardial effusion    PONV (postoperative nausea and vomiting)    Pulmonary embolus (HCC) 2003   Pulmonary hypertension (HCC)    follow[facility] Dr Bland Span at [facility], now Dr. Penni Bombard visit end 2'17.   Rectal incontinence    Renal cell carcinoma (HCC)    Scleroderma (HCC)    Tubular adenoma of colon    Uterine polyp    Past Surgical History:  Procedure Laterality Date   APPENDECTOMY  1962   BREAST LUMPECTOMY  1989   left   CATARACT EXTRACTION, BILATERAL Bilateral 12/2013   COLONOSCOPY WITH PROPOFOL N/A 04/15/2021   Procedure: COLONOSCOPY WITH PROPOFOL;  Surgeon: Rachael Fee, MD;  Location: [facility];  Service: Endoscopy;  Laterality: N/A;   ENTEROSCOPY N/A 01/18/2016   Procedure: ENTEROSCOPY;  Surgeon: Napoleon Form, MD;  Location: [facility];  Service: Endoscopy;  Laterality: N/A;   ENTEROSCOPY N/A 05/24/2018   Procedure: ENTEROSCOPY;  Surgeon: Lynann Bologna, MD;  Location: [facility];  Service: Endoscopy;  Laterality: N/A;   ENTEROSCOPY N/A 03/29/2021   Procedure: ENTEROSCOPY;  Surgeon: Beverley Fiedler, MD;  Location: [facility];  Service: Gastroenterology;  Laterality: N/A;   ENTEROSCOPY N/A 04/13/2021  Procedure: ENTEROSCOPY;  Surgeon: Benancio Deeds, MD;  Location: [facility];  Service: Gastroenterology;  Laterality: N/A;   ESOPHAGOGASTRODUODENOSCOPY (EGD) WITH PROPOFOL N/A 12/21/2015   Procedure: ESOPHAGOGASTRODUODENOSCOPY (EGD) WITH PROPOFOL;  Surgeon: Hilarie Fredrickson, MD;  Location: [facility];  Service: Endoscopy;  Laterality: N/A;   HOT HEMOSTASIS N/A 05/24/2018   Procedure: HOT HEMOSTASIS (ARGON PLASMA COAGULATION/BICAP);  Surgeon: Lynann Bologna, MD;  Location: [facility];  Service: Endoscopy;  Laterality: N/A;   HOT HEMOSTASIS N/A 03/29/2021   Procedure: HOT  HEMOSTASIS (ARGON PLASMA COAGULATION/BICAP);  Surgeon: Beverley Fiedler, MD;  Location: [facility];  Service: Gastroenterology;  Laterality: N/A;   HOT HEMOSTASIS N/A 04/13/2021   Procedure: HOT HEMOSTASIS (ARGON PLASMA COAGULATION/BICAP);  Surgeon: Benancio Deeds, MD;  Location: [facility];  Service: Gastroenterology;  Laterality: N/A;   INTRAMEDULLARY (IM) NAIL INTERTROCHANTERIC Left 10/11/2022   Procedure: INTRAMEDULLARY (IM) NAIL INTERTROCHANTERIC;  Surgeon: Ollen Gross, MD;  Location: [facility];  Service: Orthopedics;  Laterality: Left;   IR GENERIC HISTORICAL  06/05/2016   IR RADIOLOGIST EVAL & MGMT 06/05/2016 [name], MD [facility] INTERV RAD   IVC Filter     KIDNEY SURGERY     left -"laser surgery by Dr. Fredia Sorrow- 5 yrs ago" no removal   RIGHT/LEFT HEART CATH AND CORONARY ANGIOGRAPHY N/A 04/25/2022   Procedure: RIGHT/LEFT HEART CATH AND CORONARY ANGIOGRAPHY;  Surgeon: Tonny Bollman, MD;  Location: [facility] INVASIVE CV LAB;  Service: Cardiovascular;  Laterality: N/A;   SCHLEROTHERAPY  05/24/2018   Procedure: Theresia Majors;  Surgeon: Lynann Bologna, MD;  Location: [facility];  Service: Endoscopy;;   SUBMUCOSAL TATTOO INJECTION  04/13/2021   Procedure: SUBMUCOSAL TATTOO INJECTION;  Surgeon: Benancio Deeds, MD;  Location: [facility];  Service: Gastroenterology;;   TONSILLECTOMY     TUBAL LIGATION       A IV Location/Drains/Wounds Patient Lines/Drains/Airways Status     Active Line/Drains/Airways     Name Placement date Placement time Site Days   Peripheral IV 06/04/23 18 G Anterior;Proximal;Right Forearm 06/04/23  2120  Forearm  less than 1   Pressure Injury 03/18/22 Buttocks Right Stage 1 -  Intact skin with non-blanchable redness of a localized area usually over a bony prominence. 03/18/22  0825  -- 443            Intake/Output Last 24 hours No intake or output data in the 24 hours ending 06/04/23 2206  Labs/Imaging Results for orders placed or performed during  the hospital encounter of 06/04/23 (from the past 48 hour(s))  Basic metabolic panel     Status: Abnormal   Collection Time: 06/04/23  5:13 PM  Result Value Ref Range   Sodium 133 (L) 135 - 145 mmol/L   Potassium 4.7 3.5 - 5.1 mmol/L   Chloride 96 (L) 98 - 111 mmol/L   CO2 24 22 - 32 mmol/L   Glucose, Bld 92 70 - 99 mg/dL    Comment: Glucose reference range applies only to samples taken after fasting for at least 8 hours.   BUN 34 (H) 8 - 23 mg/dL   Creatinine, Ser 4.78 (H) 0.44 - 1.00 mg/dL   Calcium 8.9 8.9 - 29.5 mg/dL   GFR, Estimated 42 (L) >60 mL/min    Comment: (NOTE) Calculated using the CKD-EPI Creatinine Equation (2021)    Anion gap 13 5 - 15    Comment: Performed at [facility], [address], [place], [place] [address]  CBC     Status: Abnormal  Collection Time: 06/04/23  5:13 PM  Result Value Ref Range   WBC 6.1 4.0 - 10.5 K/uL   RBC 2.69 (L) 3.87 - 5.11 MIL/uL   Hemoglobin 9.4 (L) 12.0 - 15.0 g/dL   HCT 42.5 (L) 95.6 - 38.7 %   MCV 111.5 (H) 80.0 - 100.0 fL   MCH 34.9 (H) 26.0 - 34.0 pg   MCHC 31.3 30.0 - 36.0 g/dL   RDW 56.4 33.2 - 95.1 %   Platelets 172 150 - 400 K/uL   nRBC 0.0 0.0 - 0.2 %    Comment: Performed at [facility], [address], [place], [place] [address]  Troponin I (High Sensitivity)     Status: Abnormal   Collection Time: 06/04/23  5:13 PM  Result Value Ref Range   Troponin I (High Sensitivity) 19 (H) <18 ng/L    Comment: (NOTE) Elevated high sensitivity troponin I (hsTnI) values and significant  changes across serial measurements may suggest ACS but many other  chronic and acute conditions are known to elevate hsTnI results.  Refer to the "Links" section for chest pain algorithms and additional  guidance. Performed at [facility], [address] [place], [place] [address]    *Note: Due to a large number of results and/or encounters for the requested time period, some results have not been displayed. A complete  set of results can be found in Results Review.   DG Chest 2 View  Result Date: 06/04/2023 CLINICAL DATA:  Shortness of breath, chest tightness EXAM: CHEST - 2 VIEW COMPARISON:  Chest radiograph dated 05/13/2023. CT chest dated 05/13/2022 FINDINGS: Stable cardiomegaly. Pericardial effusion is suspected, as noted on prior CT. Chronic additional markings, likely reflecting chronic interstitial lung disease when correlating with prior CT. No frank interstitial edema. No definite pleural effusions.  No pneumothorax. Surgical clips along the left chest wall/axilla. IMPRESSION: Stable cardiomegaly with suspected pericardial effusion. Electronically Signed   By: Charline Bills M.D.   On: 06/04/2023 18:51    Pending Labs Unresulted Labs (From admission, onward)     Start     Ordered   06/04/23 2143  Brain natriuretic peptide  Once,   URGENT        06/04/23 2142            Vitals/Pain Today's Vitals   06/04/23 1613 06/04/23 1658 06/04/23 2042 06/04/23 2045  BP: (!) 122/53  (!) 147/60 (!) 140/56  Pulse: 83  74 78  Resp: 18  (!) 22 18  Temp: 97.9 F (36.6 C)  98.4 F (36.9 C)   TempSrc: Oral  Oral   SpO2: 98%  95% 97%  Weight:  45.8 kg    Height:  5\' 3"  (1.6 m)    PainSc:  5       Isolation Precautions No active isolations  Medications Medications  furosemide (LASIX) injection 40 mg (40 mg Intravenous Given 06/04/23 2124)    Mobility walks with device     Focused Assessments Pulmonary Assessment Handoff:  Lung sounds: Bilateral Breath Sounds: Diminished L Breath Sounds: Coarse crackles R Breath Sounds: Coarse crackles O2 Device: Room Air      R Recommendations: See Admitting Provider Note  Report given to:   Additional Notes: Pt a/o x4 and able to ambulate with cane

## 2023-06-04 NOTE — Telephone Encounter (Signed)
Spoke with patient regarding her labs. Renal function is better but BNP continues to rise despite increase in diuretics. Patient also feeling more fatigued today with persistent dyspnea. Advised her to go to ER for admission for further cardiac workup. Patient agreed to go to hospital. I notified the ER and inpatient pulmonary teams.  Melody Comas, MD [facility] Office: [phone]   See Amion for personal pager PCCM on call pager [phone] until 7pm. Please call Elink 7p-7a. [phone]

## 2023-06-04 NOTE — H&P (Signed)
History and Physical    Paula Ward:[id number] DOB: [birth year] DOA: 06/04/2023  PCP: [name], MD  Patient coming from: Home  Chief Complaint: Shortness of breath  HPI: Paula Ward is a 87 y.o. female with medical history significant of scleroderma, crest syndrome, ILD, pulmonary hypertension, pericardial effusion, CKD stage IIIb, AVMs of small bowel, chronic respiratory failure requiring supplemental oxygen at night, history of breast cancer and renal cell carcinoma, GERD, history of PE, B12 deficiency and chronic macrocytic anemia presented to the ED with complaints of shortness of breath, chest tightness, orthopnea, and bilateral lower extremity edema.  She was sent here by pulmonology due to concern for rising BNP despite increasing diuretics.  BNP was 1750 on labs done 05/13/2023 and 2680 on repeat labs done yesterday.  Patient is reporting increasing shortness of breath, orthopnea, and bilateral lower extremity edema despite increasing the dose of her home diuretics.  States she was previously taking furosemide 20 mg daily and spironolactone 12.5 mg daily.  States her pulmonologist doubled the dose of her diuretics to furosemide 40 mg daily and spironolactone 12.5 mg daily about 5 days ago but her symptoms have not improved.  She also reports intermittent chest tightness which she thinks is chronic.  Patient states her symptoms have been ongoing since fall of last year.  She is endorsing cough but denies fevers.  She reports history of chronic pericardial effusion which is being monitored by her cardiologist at [facility].  No other complaints.  ED Course: Vital signs stable, not hypoxic.  Labs showing no leukocytosis, hemoglobin 9.4 (close to baseline), sodium 133, creatinine 1.2 (stable), initial troponin 19 and repeat pending.  Chest x-ray showing stable cardiomegaly with suspected pericardial effusion; no pulmonary edema or pleural effusions.  EKG without acute ischemic  changes.  Patient received IV Lasix 40 mg.  Review of Systems:  Review of Systems  All other systems reviewed and are negative.   Past Medical History:  Diagnosis Date   Anemia    Angiodysplasia of stomach and duodenum    Arthus phenomenon    AVM (arteriovenous malformation)    Blood transfusion without reported diagnosis    last 4 units 12-22-15, Iron infusion x2 last -01-07-16,01-14-16.   Breast cancer (HCC) 1989   Left   Candida esophagitis (HCC)    Cataract    Chronic kidney disease    Chronic mild renal insuffiency   CREST syndrome (HCC)    GERD (gastroesophageal reflux disease)    w/ HH   Interstitial lung disease (HCC)    Nodule of kidney    Pericardial effusion    PONV (postoperative nausea and vomiting)    Pulmonary embolus (HCC) 2003   Pulmonary hypertension (HCC)    followed by Dr Bland Span at [facility], now Dr. Penni Bombard visit end 2'17.   Rectal incontinence    Renal cell carcinoma (HCC)    Scleroderma (HCC)    Tubular adenoma of colon    Uterine polyp     Past Surgical History:  Procedure Laterality Date   APPENDECTOMY  1962   BREAST LUMPECTOMY  1989   left   CATARACT EXTRACTION, BILATERAL Bilateral 12/2013   COLONOSCOPY WITH PROPOFOL N/A 04/15/2021   Procedure: COLONOSCOPY WITH PROPOFOL;  Surgeon: Rachael Fee, MD;  Location: [facility];  Service: Endoscopy;  Laterality: N/A;   ENTEROSCOPY N/A 01/18/2016   Procedure: ENTEROSCOPY;  Surgeon: Napoleon Form, MD;  Location: [facility];  Service: Endoscopy;  Laterality: N/A;   ENTEROSCOPY  N/A 05/24/2018   Procedure: ENTEROSCOPY;  Surgeon: Lynann Bologna, MD;  Location: [facility];  Service: Endoscopy;  Laterality: N/A;   ENTEROSCOPY N/A 03/29/2021   Procedure: ENTEROSCOPY;  Surgeon: Beverley Fiedler, MD;  Location: [facility];  Service: Gastroenterology;  Laterality: N/A;   ENTEROSCOPY N/A 04/13/2021   Procedure: ENTEROSCOPY;  Surgeon: Benancio Deeds, MD;  Location: [facility];  Service:  Gastroenterology;  Laterality: N/A;   ESOPHAGOGASTRODUODENOSCOPY (EGD) WITH PROPOFOL N/A 12/21/2015   Procedure: ESOPHAGOGASTRODUODENOSCOPY (EGD) WITH PROPOFOL;  Surgeon: Hilarie Fredrickson, MD;  Location: [facility];  Service: Endoscopy;  Laterality: N/A;   HOT HEMOSTASIS N/A 05/24/2018   Procedure: HOT HEMOSTASIS (ARGON PLASMA COAGULATION/BICAP);  Surgeon: Lynann Bologna, MD;  Location: [facility];  Service: Endoscopy;  Laterality: N/A;   HOT HEMOSTASIS N/A 03/29/2021   Procedure: HOT HEMOSTASIS (ARGON PLASMA COAGULATION/BICAP);  Surgeon: Beverley Fiedler, MD;  Location: [facility];  Service: Gastroenterology;  Laterality: N/A;   HOT HEMOSTASIS N/A 04/13/2021   Procedure: HOT HEMOSTASIS (ARGON PLASMA COAGULATION/BICAP);  Surgeon: Benancio Deeds, MD;  Location: [facility];  Service: Gastroenterology;  Laterality: N/A;   INTRAMEDULLARY (IM) NAIL INTERTROCHANTERIC Left 10/11/2022   Procedure: INTRAMEDULLARY (IM) NAIL INTERTROCHANTERIC;  Surgeon: Ollen Gross, MD;  Location: [facility];  Service: Orthopedics;  Laterality: Left;   IR GENERIC HISTORICAL  06/05/2016   IR RADIOLOGIST EVAL & MGMT 06/05/2016 [name], MD [facility] INTERV RAD   IVC Filter     KIDNEY SURGERY     left -"laser surgery by Dr. Fredia Sorrow- 5 yrs ago" no removal   RIGHT/LEFT HEART CATH AND CORONARY ANGIOGRAPHY N/A 04/25/2022   Procedure: RIGHT/LEFT HEART CATH AND CORONARY ANGIOGRAPHY;  Surgeon: Tonny Bollman, MD;  Location: [facility] INVASIVE CV LAB;  Service: Cardiovascular;  Laterality: N/A;   SCHLEROTHERAPY  05/24/2018   Procedure: Theresia Majors;  Surgeon: Lynann Bologna, MD;  Location: [facility];  Service: Endoscopy;;   SUBMUCOSAL TATTOO INJECTION  04/13/2021   Procedure: SUBMUCOSAL TATTOO INJECTION;  Surgeon: Benancio Deeds, MD;  Location: [facility];  Service: Gastroenterology;;   TONSILLECTOMY     TUBAL LIGATION       reports that she has never smoked. She has never used smokeless tobacco. She reports that she does not  drink alcohol and does not use drugs.  Allergies  Allergen Reactions   Codeine Nausea Only    Hallucinations, too   Other Nausea And Vomiting    "-mycin" antibiotics.   Also cause hallucinations.   Erythromycin Nausea And Vomiting   Iodinated Contrast Media Other (See Comments)    Renal issues   Lisinopril     Pt doesn't remember    Mycophenolate Mofetil Nausea Only   Sulfa Antibiotics Other (See Comments)    unknown    Family History  Problem Relation Age of Onset   Bladder Cancer Father    Diabetes Father    Prostate cancer Father    Alzheimer's disease Mother    Diabetes Sister    Lung cancer Sister         smoker   Esophageal cancer Paternal Uncle    Colon cancer Neg Hx     Prior to Admission medications   Medication Sig Start Date End Date Taking? Authorizing Provider  clobetasol ointment (TEMOVATE) 0.05 % Apply 1 Application topically 2 (two) times daily. 04/20/23  Yes [provider]  acetaminophen (TYLENOL) 325 MG tablet Take 2 tablets (650 mg total) by mouth every 6 (six) hours as needed for moderate pain. 07/18/22  Wallis Bamberg, PA-C  albuterol (VENTOLIN HFA) 108 (90 Base) MCG/ACT inhaler INHALE 2 PUFFS INTO THE LUNGS EVERY 6 HOURS AS NEEDED FOR WHEEZING OR SHORTNESS OF BREATH 03/13/23   [name], MD  citalopram (CELEXA) 10 MG tablet Take 1 tablet (10 mg total) by mouth daily. Patient taking differently: Take 10 mg by mouth daily as needed (depression). 09/29/22   [name], MD  Cyanocobalamin (VITAMIN B-12) 1000 MCG SUBL Place 1,000 mcg under the tongue. Taking twice a week    [provider]  diltiazem (CARDIZEM CD) 120 MG 24 hr capsule Take 1 capsule (120 mg total) by mouth daily. Patient not taking: Reported on 06/03/2023 05/27/23 08/25/23  Lewayne Bunting, MD  diltiazem (CARDIZEM SR) 60 MG 12 hr capsule Take 60 mg by mouth daily. 05/11/23   [provider]  docusate sodium (COLACE) 100 MG capsule Take 1 capsule (100  mg total) by mouth 2 (two) times daily. 10/15/22   Edmisten, Kristie L, PA  famotidine (PEPCID) 20 MG tablet Take 1 tablet (20 mg total) by mouth at bedtime. 02/10/23   Napoleon Form, MD  fluticasone (FLONASE) 50 MCG/ACT nasal spray Place 1 spray into both nostrils daily as needed for allergies. Patient taking differently: Place 1 spray into both nostrils in the morning and at bedtime. 05/15/22   [name], MD  furosemide (LASIX) 20 MG tablet Take 1 tablet by mouth once daily 04/30/23   [name], MD  guaiFENesin (MUCINEX) 600 MG 12 hr tablet Take 300 mg by mouth daily.    [provider]  ipratropium (ATROVENT) 0.03 % nasal spray Place 2 sprays into both nostrils every 12 (twelve) hours. 05/12/22   Martina Sinner, MD  latanoprost (XALATAN) 0.005 % ophthalmic solution Place 1 drop into both eyes at bedtime. 09/23/22   [provider]  magic mouthwash SOLN Take 10 mLs by mouth 4 (four) times daily as needed for mouth pain. 03/22/22   Burnadette Pop, MD  mirtazapine (REMERON) 15 MG tablet Take 1 tablet (15 mg total) by mouth at bedtime. 03/13/23   [name], MD  montelukast (SINGULAIR) 10 MG tablet TAKE 1 TABLET BY MOUTH AT BEDTIME 04/27/23   [name], MD  Multiple Vitamin (MULTIVITAMIN) capsule Take 1 capsule by mouth daily.    [provider]  mupirocin ointment (BACTROBAN) 2 % Apply 1 Application topically 2 (two) times daily as needed (dry skin).    [provider]  ondansetron (ZOFRAN) 4 MG tablet Take 1 tablet (4 mg total) by mouth every 6 (six) hours as needed for nausea. 10/15/22   Edmisten, Kristie L, PA  OPSUMIT 10 MG TABS Take 10 mg by mouth daily. 08/16/15   [provider]  OXYGEN Inhale into the lungs. 2 LMP at night and upon exertion    [provider]  pantoprazole (PROTONIX) 40 MG tablet Take 1 tablet (40 mg total) by mouth daily. 02/10/23   Napoleon Form, MD  polyethylene  glycol (MIRALAX / GLYCOLAX) 17 g packet Take 17 g by mouth daily. 10/15/22   Edmisten, Lyn Hollingshead, PA  spironolactone (ALDACTONE) 25 MG tablet Take 0.5 tablets (12.5 mg total) by mouth daily. 01/30/23   [name], MD  sucralfate (CARAFATE) 1 GM/10ML suspension TAKE 10 MLS BY MOUTH EVERY 6 HOURS AS NEEDED FOR REFLUX 02/11/23   Napoleon Form, MD    Physical Exam: Vitals:   06/04/23 1613 06/04/23 1658 06/04/23 2042 06/04/23  2045  BP: (!) 122/53  (!) 147/60 (!) 140/56  Pulse: 83  74 78  Resp: 18  (!) 22 18  Temp: 97.9 F (36.6 C)  98.4 F (36.9 C)   TempSrc: Oral  Oral   SpO2: 98%  95% 97%  Weight:  45.8 kg    Height:  5\' 3"  (1.6 m)      Physical Exam Vitals reviewed.  Constitutional:      General: She is not in acute distress. HENT:     Head: Normocephalic and atraumatic.  Eyes:     Extraocular Movements: Extraocular movements intact.  Cardiovascular:     Rate and Rhythm: Normal rate and regular rhythm.     Pulses: Normal pulses.     Heart sounds: Murmur heard.     Comments: Murmur best appreciated in the aortic region Pulmonary:     Effort: Pulmonary effort is normal. No respiratory distress.     Breath sounds: No wheezing or rales.  Abdominal:     General: Bowel sounds are normal. There is no distension.     Palpations: Abdomen is soft.     Tenderness: There is no abdominal tenderness.  Musculoskeletal:     Cervical back: Normal range of motion.     Right lower leg: Edema present.     Left lower leg: Edema present.     Comments: 2+ pitting edema of bilateral lower legs  Skin:    General: Skin is warm and dry.  Neurological:     General: No focal deficit present.     Mental Status: She is alert and oriented to person, place, and time.     Labs on Admission: I have personally reviewed following labs and imaging studies  CBC: Recent Labs  Lab 06/03/23 1200 06/04/23 1713  WBC 4.4 6.1  NEUTROABS 3.2  --   HGB 10.0* 9.4*  HCT 30.7* 30.0*  MCV  107.1* 111.5*  PLT 173.0 172   Basic Metabolic Panel: Recent Labs  Lab 06/03/23 1200 06/04/23 1713  NA 132* 133*  K 4.3 4.7  CL 93* 96*  CO2 31 24  GLUCOSE 89 92  BUN 36* 34*  CREATININE 1.16 1.26*  CALCIUM 9.5 8.9   GFR: Estimated Creatinine Clearance: 24 mL/min (A) (by C-G formula based on SCr of 1.26 mg/dL (H)). Liver Function Tests: Recent Labs  Lab 06/03/23 1200  AST 19  ALT 8  ALKPHOS 62  BILITOT 0.6  PROT 6.7  ALBUMIN 4.2   No results for input(s): "LIPASE", "AMYLASE" in the last 168 hours. No results for input(s): "AMMONIA" in the last 168 hours. Coagulation Profile: No results for input(s): "INR", "PROTIME" in the last 168 hours. Cardiac Enzymes: No results for input(s): "CKTOTAL", "CKMB", "CKMBINDEX", "TROPONINI" in the last 168 hours. BNP (last 3 results) Recent Labs    05/13/23 1507 06/03/23 1200  PROBNP 1,750.0* 2,680.0*   HbA1C: No results for input(s): "HGBA1C" in the last 72 hours. CBG: No results for input(s): "GLUCAP" in the last 168 hours. Lipid Profile: No results for input(s): "CHOL", "HDL", "LDLCALC", "TRIG", "CHOLHDL", "LDLDIRECT" in the last 72 hours. Thyroid Function Tests: No results for input(s): "TSH", "T4TOTAL", "FREET4", "T3FREE", "THYROIDAB" in the last 72 hours. Anemia Panel: No results for input(s): "VITAMINB12", "FOLATE", "FERRITIN", "TIBC", "IRON", "RETICCTPCT" in the last 72 hours. Urine analysis:    Component Value Date/Time   COLORURINE YELLOW 03/13/2023 1512   APPEARANCEUR CLEAR 03/13/2023 1512   LABSPEC 1.020 03/13/2023 1512   PHURINE 5.5 03/13/2023 1512  GLUCOSEU NEGATIVE 03/13/2023 1512   HGBUR NEGATIVE 03/13/2023 1512   BILIRUBINUR negative 04/03/2023 1532   BILIRUBINUR Neg 09/03/2017 1529   KETONESUR negative 04/03/2023 1532   KETONESUR TRACE (A) 03/13/2023 1512   PROTEINUR negative 04/03/2023 1532   PROTEINUR NEGATIVE 03/30/2021 1416   UROBILINOGEN 0.2 04/03/2023 1532   UROBILINOGEN 1.0 03/13/2023 1512    NITRITE Negative 04/03/2023 1532   NITRITE NEGATIVE 03/13/2023 1512   LEUKOCYTESUR Negative 04/03/2023 1532   LEUKOCYTESUR NEGATIVE 03/13/2023 1512    Radiological Exams on Admission: DG Chest 2 View  Result Date: 06/04/2023 CLINICAL DATA:  Shortness of breath, chest tightness EXAM: CHEST - 2 VIEW COMPARISON:  Chest radiograph dated 05/13/2023. CT chest dated 05/13/2022 FINDINGS: Stable cardiomegaly. Pericardial effusion is suspected, as noted on prior CT. Chronic additional markings, likely reflecting chronic interstitial lung disease when correlating with prior CT. No frank interstitial edema. No definite pleural effusions.  No pneumothorax. Surgical clips along the left chest wall/axilla. IMPRESSION: Stable cardiomegaly with suspected pericardial effusion. Electronically Signed   By: Charline Bills M.D.   On: 06/04/2023 18:51    EKG: Independently reviewed.  Sinus rhythm, no acute ischemic changes.  Assessment and Plan  Volume overload Acute CHF History of pulmonary hypertension, pericardial effusion, mitral stenosis, aortic regurgitation Patient is presenting with complaints of worsening dyspnea, orthopnea, and bilateral lower extremity edema despite doubling the dose of her home diuretics recently. BNP was 1750 on labs done 05/13/2023 and 2680 on repeat labs done yesterday.  She was sent to the ED by pulmonology.  No hypoxia or respiratory distress.  Chest x-ray showing stable cardiomegaly with suspected pericardial effusion; no pulmonary edema or pleural effusions.  Pericardial effusion appears to be chronic, no signs of tamponade.  Last echo done in March 2024 showing EF 60 to 65%, grade 1 diastolic dysfunction, moderately elevated pulmonary artery systolic pressure, large pericardial effusion, mild to moderate mitral stenosis, mild to moderate tricuspid valve regurgitation, and moderate aortic valve regurgitation.  Patient received IV Lasix 40 mg in the ED.  Repeat echocardiogram  ordered.  Monitor intake and output, daily weights, low-sodium diet with fluid restriction.  Trend BNP.  Consult cardiology in the morning.  Chest tightness EKG without acute ischemic changes.  Troponin mildly elevated but stable (19> 22) and not consistent with ACS.  Patient is currently chest pain-free and appears comfortable.  Chronic macrocytic anemia History of B12 deficiency Hemoglobin close to baseline.  Mild hyponatremia Sodium stable compared to recent labs, continue to monitor.  CKD stage IIIb Creatinine 1.2, stable.  Continue to monitor renal function.  GERD Continue PPI and H2 blocker.  Pulmonary hypertension Continue macitentan.  Repeat echocardiogram ordered.  Pericardial effusion Appears to be chronic, no signs of tamponade.  Repeat echocardiogram ordered.  DVT prophylaxis: Lovenox (dose renally adjusted) Code Status: DNR/DNI (discussed with the patient) Family Communication: No family available at this time. Level of care: Telemetry bed Admission status: It is my clinical opinion that admission to INPATIENT is reasonable and necessary because of the expectation that this patient will require hospital care that crosses at least 2 midnights to treat this condition based on the medical complexity of the problems presented.  Given the aforementioned information, the predictability of an adverse outcome is felt to be significant.   John Giovanni MD [facility]  If 7PM-7AM, please contact night-coverage [email or web address]  06/04/2023, 9:23 PM

## 2023-06-04 NOTE — Telephone Encounter (Signed)
Patient called the answering serivce. LOV 06/03/23. Stated: wants to know if she needs to go to the hospital or not.   Based off of notes from visit, JD needed to review her labs prior to advising on this. Routing DOD-please route back to the front and we will notify patient of your response.

## 2023-06-04 NOTE — ED Provider Notes (Signed)
[place] EMERGENCY DEPARTMENT AT [facility] Provider Note  CSN: [id number] Arrival date & time: 06/04/23 1604  Chief Complaint(s) Shortness of Breath  HPI Paula Ward is a 87 y.o. female history of scleroderma, pulmonary hypertension, interstitial lung disease presenting to the emergency department with shortness of breath.  Patient reports shortness of breath for the past 2 to 3 weeks which has been progressively worsening.  Worse with lying flat or with any exertion.  No chest pain.  No recent travel or surgeries.  No cough, fevers or chills.  Has been compliant with her medications including diuretics.  No syncope, near syncope.  No fevers or chills.  Saw pulmonologist yesterday and had her diuretic increased but symptoms are progressive   Past Medical History Past Medical History:  Diagnosis Date   Anemia    Angiodysplasia of stomach and duodenum    Arthus phenomenon    AVM (arteriovenous malformation)    Blood transfusion without reported diagnosis    last 4 units 12-22-15, Iron infusion x2 last -01-07-16,01-14-16.   Breast cancer (HCC) 1989   Left   Candida esophagitis (HCC)    Cataract    Chronic kidney disease    Chronic mild renal insuffiency   CREST syndrome (HCC)    GERD (gastroesophageal reflux disease)    w/ HH   Interstitial lung disease (HCC)    Nodule of kidney    Pericardial effusion    PONV (postoperative nausea and vomiting)    Pulmonary embolus (HCC) 2003   Pulmonary hypertension (HCC)    followed by Dr Bland Span at [facility], now Dr. Penni Bombard visit end 2'17.   Rectal incontinence    Renal cell carcinoma (HCC)    Scleroderma (HCC)    Tubular adenoma of colon    Uterine polyp    Patient Active Problem List   Diagnosis Date Noted   Volume overload 06/04/2023   Orthostatic hypotension 05/21/2023   Vitamin B12 deficiency 03/02/2023   Closed comminuted intertrochanteric fracture of left femur (HCC) 10/10/2022   Chronic respiratory failure  with hypoxia (HCC) 10/10/2022   Adjustment disorder 10/02/2022   Moderate aortic stenosis 04/26/2022   Aortic stenosis, moderate 10/29/2021   Generalized weakness 03/29/2021   Chronic diastolic heart failure (HCC) 03/29/2021   AVM (arteriovenous malformation) of small bowel, acquired    Anemia, chronic disease 12/25/2020   Globus pharyngeus 08/19/2019   Dry mouth 02/15/2018   Allergic rhinitis 11/05/2017   Cerebellar 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 (HCC) 10/26/2017   Pericardial effusion 01/21/2016   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HCC)    Osteoarthritis 02/25/2015   History of pulmonary embolism 01/29/2013   CKD (chronic kidney disease) stage 3, GFR 30-59 ml/min (HCC) 09/01/2012   RECTAL INCONTINENCE 03/27/2009   Angiodysplasia of stomach and duodenum 08/22/2008   ADENOCARCINOMA, BREAST, LEFT 07/26/2007   Anemia, iron deficiency 07/26/2007   Essential hypertension 07/26/2007   Pulmonary hypertension (HCC) 07/26/2007   GASTROESOPHAGEAL REFLUX DISEASE 07/26/2007   SCLERODERMA 07/26/2007   ILD (interstitial lung disease) (HCC) 07/26/2007   PULMONARY EMBOLISM, HX OF 07/26/2007   Home Medication(s) Prior to Admission medications   Medication Sig Start Date End Date Taking? Authorizing Provider  clobetasol ointment (TEMOVATE) 0.05 % Apply 1 Application topically 2 (two) times daily. 04/20/23  Yes [provider]  acetaminophen (TYLENOL) 325 MG tablet Take 2 tablets (650 mg total) by mouth every 6 (six) hours as needed for moderate pain. 07/18/22   Wallis Bamberg, PA-C  albuterol (VENTOLIN HFA)  108 (90 Base) MCG/ACT inhaler INHALE 2 PUFFS INTO THE LUNGS EVERY 6 HOURS AS NEEDED FOR WHEEZING OR SHORTNESS OF BREATH 03/13/23   [name], MD  citalopram (CELEXA) 10 MG tablet Take 1 tablet (10 mg total) by mouth daily. Patient taking differently: Take 10 mg by mouth daily as needed (depression). 09/29/22   [name], MD  Cyanocobalamin (VITAMIN B-12) 1000  MCG SUBL Place 1,000 mcg under the tongue. Taking twice a week    [provider]  diltiazem (CARDIZEM CD) 120 MG 24 hr capsule Take 1 capsule (120 mg total) by mouth daily. Patient not taking: Reported on 06/03/2023 05/27/23 08/25/23  Lewayne Bunting, MD  diltiazem (CARDIZEM SR) 60 MG 12 hr capsule Take 60 mg by mouth daily. 05/11/23   [provider]  docusate sodium (COLACE) 100 MG capsule Take 1 capsule (100 mg total) by mouth 2 (two) times daily. 10/15/22   Edmisten, Kristie L, PA  famotidine (PEPCID) 20 MG tablet Take 1 tablet (20 mg total) by mouth at bedtime. 02/10/23   Napoleon Form, MD  fluticasone (FLONASE) 50 MCG/ACT nasal spray Place 1 spray into both nostrils daily as needed for allergies. Patient taking differently: Place 1 spray into both nostrils in the morning and at bedtime. 05/15/22   [name], MD  furosemide (LASIX) 20 MG tablet Take 1 tablet by mouth once daily 04/30/23   [name], MD  guaiFENesin (MUCINEX) 600 MG 12 hr tablet Take 300 mg by mouth daily.    [provider]  ipratropium (ATROVENT) 0.03 % nasal spray Place 2 sprays into both nostrils every 12 (twelve) hours. 05/12/22   Martina Sinner, MD  latanoprost (XALATAN) 0.005 % ophthalmic solution Place 1 drop into both eyes at bedtime. 09/23/22   [provider]  magic mouthwash SOLN Take 10 mLs by mouth 4 (four) times daily as needed for mouth pain. 03/22/22   Burnadette Pop, MD  mirtazapine (REMERON) 15 MG tablet Take 1 tablet (15 mg total) by mouth at bedtime. 03/13/23   [name], MD  montelukast (SINGULAIR) 10 MG tablet TAKE 1 TABLET BY MOUTH AT BEDTIME 04/27/23   [name], MD  Multiple Vitamin (MULTIVITAMIN) capsule Take 1 capsule by mouth daily.    [provider]  mupirocin ointment (BACTROBAN) 2 % Apply 1 Application topically 2 (two) times daily as needed (dry skin).    [provider]  ondansetron (ZOFRAN) 4  MG tablet Take 1 tablet (4 mg total) by mouth every 6 (six) hours as needed for nausea. 10/15/22   Edmisten, Kristie L, PA  OPSUMIT 10 MG TABS Take 10 mg by mouth daily. 08/16/15   [provider]  OXYGEN Inhale into the lungs. 2 LMP at night and upon exertion    [provider]  pantoprazole (PROTONIX) 40 MG tablet Take 1 tablet (40 mg total) by mouth daily. 02/10/23   Napoleon Form, MD  polyethylene glycol (MIRALAX / GLYCOLAX) 17 g packet Take 17 g by mouth daily. 10/15/22   Edmisten, Lyn Hollingshead, PA  spironolactone (ALDACTONE) 25 MG tablet Take 0.5 tablets (12.5 mg total) by mouth daily. 01/30/23   [name], MD  sucralfate (CARAFATE) 1 GM/10ML suspension TAKE 10 MLS BY MOUTH EVERY 6 HOURS AS NEEDED FOR REFLUX 02/11/23   Napoleon Form, MD  Past Surgical History Past Surgical History:  Procedure Laterality Date   APPENDECTOMY  1962   BREAST LUMPECTOMY  1989   left   CATARACT EXTRACTION, BILATERAL Bilateral 12/2013   COLONOSCOPY WITH PROPOFOL N/A 04/15/2021   Procedure: COLONOSCOPY WITH PROPOFOL;  Surgeon: Rachael Fee, MD;  Location: [facility];  Service: Endoscopy;  Laterality: N/A;   ENTEROSCOPY N/A 01/18/2016   Procedure: ENTEROSCOPY;  Surgeon: Napoleon Form, MD;  Location: [facility];  Service: Endoscopy;  Laterality: N/A;   ENTEROSCOPY N/A 05/24/2018   Procedure: ENTEROSCOPY;  Surgeon: Lynann Bologna, MD;  Location: [facility];  Service: Endoscopy;  Laterality: N/A;   ENTEROSCOPY N/A 03/29/2021   Procedure: ENTEROSCOPY;  Surgeon: Beverley Fiedler, MD;  Location: [facility];  Service: Gastroenterology;  Laterality: N/A;   ENTEROSCOPY N/A 04/13/2021   Procedure: ENTEROSCOPY;  Surgeon: Benancio Deeds, MD;  Location: [facility];  Service: Gastroenterology;  Laterality: N/A;   ESOPHAGOGASTRODUODENOSCOPY (EGD)  WITH PROPOFOL N/A 12/21/2015   Procedure: ESOPHAGOGASTRODUODENOSCOPY (EGD) WITH PROPOFOL;  Surgeon: Hilarie Fredrickson, MD;  Location: [facility];  Service: Endoscopy;  Laterality: N/A;   HOT HEMOSTASIS N/A 05/24/2018   Procedure: HOT HEMOSTASIS (ARGON PLASMA COAGULATION/BICAP);  Surgeon: Lynann Bologna, MD;  Location: [facility];  Service: Endoscopy;  Laterality: N/A;   HOT HEMOSTASIS N/A 03/29/2021   Procedure: HOT HEMOSTASIS (ARGON PLASMA COAGULATION/BICAP);  Surgeon: Beverley Fiedler, MD;  Location: [facility];  Service: Gastroenterology;  Laterality: N/A;   HOT HEMOSTASIS N/A 04/13/2021   Procedure: HOT HEMOSTASIS (ARGON PLASMA COAGULATION/BICAP);  Surgeon: Benancio Deeds, MD;  Location: [facility];  Service: Gastroenterology;  Laterality: N/A;   INTRAMEDULLARY (IM) NAIL INTERTROCHANTERIC Left 10/11/2022   Procedure: INTRAMEDULLARY (IM) NAIL INTERTROCHANTERIC;  Surgeon: Ollen Gross, MD;  Location: [facility];  Service: Orthopedics;  Laterality: Left;   IR GENERIC HISTORICAL  06/05/2016   IR RADIOLOGIST EVAL & MGMT 06/05/2016 [name], MD [facility] INTERV RAD   IVC Filter     KIDNEY SURGERY     left -"laser surgery by Dr. Fredia Sorrow- 5 yrs ago" no removal   RIGHT/LEFT HEART CATH AND CORONARY ANGIOGRAPHY N/A 04/25/2022   Procedure: RIGHT/LEFT HEART CATH AND CORONARY ANGIOGRAPHY;  Surgeon: Tonny Bollman, MD;  Location: [facility] INVASIVE CV LAB;  Service: Cardiovascular;  Laterality: N/A;   SCHLEROTHERAPY  05/24/2018   Procedure: Theresia Majors;  Surgeon: Lynann Bologna, MD;  Location: [facility];  Service: Endoscopy;;   SUBMUCOSAL TATTOO INJECTION  04/13/2021   Procedure: SUBMUCOSAL TATTOO INJECTION;  Surgeon: Benancio Deeds, MD;  Location: [facility];  Service: Gastroenterology;;   TONSILLECTOMY     TUBAL LIGATION     Family History Family History  Problem Relation Age of Onset   Bladder Cancer Father    Diabetes Father    Prostate cancer Father    Alzheimer's disease Mother    Diabetes  Sister    Lung cancer Sister         smoker   Esophageal cancer Paternal Uncle    Colon cancer Neg Hx     Social History Social History   Tobacco Use   Smoking status: Never   Smokeless tobacco: Never  Vaping Use   Vaping status: Never Used  Substance Use Topics   Alcohol use: No   Drug use: No   Allergies Codeine, Other, Erythromycin, Iodinated contrast media, Lisinopril, Mycophenolate mofetil, and Sulfa antibiotics  Review of Systems Review of Systems  All other systems reviewed and are negative.   Physical Exam Vital Signs  I have reviewed the triage vital signs BP (!) 140/56   Pulse 78   Temp 98.4 F (36.9 C) (Oral)   Resp 18   Ht 5\' 3"  (1.6 m)   Wt 45.8 kg   SpO2 97%   BMI 17.89 kg/m  Physical Exam Vitals and nursing note reviewed.  Constitutional:      General: She is not in acute distress.    Appearance: She is well-developed.  HENT:     Head: Normocephalic and atraumatic.     Mouth/Throat:     Mouth: Mucous membranes are moist.  Eyes:     Pupils: Pupils are equal, round, and reactive to light.  Neck:     Vascular: Hepatojugular reflux and JVD present.  Cardiovascular:     Rate and Rhythm: Normal rate and regular rhythm.     Heart sounds: Murmur heard.  Pulmonary:     Effort: Pulmonary effort is normal. No respiratory distress.     Breath sounds: Examination of the right-lower field reveals rales. Examination of the left-lower field reveals rales. Rales present.  Abdominal:     General: Abdomen is flat.     Palpations: Abdomen is soft.     Tenderness: There is no abdominal tenderness.  Musculoskeletal:        General: No tenderness.     Right lower leg: Edema present.     Left lower leg: Edema present.  Skin:    General: Skin is warm and dry.  Neurological:     General: No focal deficit present.     Mental Status: She is alert. Mental status is at baseline.  Psychiatric:        Mood and Affect: Mood normal.        Behavior: Behavior  normal.     ED Results and Treatments Labs (all labs ordered are listed, but only abnormal results are displayed) Labs Reviewed  BASIC METABOLIC PANEL - Abnormal; Notable for the following components:      Result Value   Sodium 133 (*)    Chloride 96 (*)    BUN 34 (*)    Creatinine, Ser 1.26 (*)    GFR, Estimated 42 (*)    All other components within normal limits  CBC - Abnormal; Notable for the following components:   RBC 2.69 (*)    Hemoglobin 9.4 (*)    HCT 30.0 (*)    MCV 111.5 (*)    MCH 34.9 (*)    All other components within normal limits  TROPONIN I (HIGH SENSITIVITY) - Abnormal; Notable for the following components:   Troponin I (High Sensitivity) 19 (*)    All other components within normal limits  BRAIN NATRIURETIC PEPTIDE  TROPONIN I (HIGH SENSITIVITY)                                                                                                                          Radiology DG Chest 2 View  Result Date: 06/04/2023 CLINICAL  DATA:  Shortness of breath, chest tightness EXAM: CHEST - 2 VIEW COMPARISON:  Chest radiograph dated 05/13/2023. CT chest dated 05/13/2022 FINDINGS: Stable cardiomegaly. Pericardial effusion is suspected, as noted on prior CT. Chronic additional markings, likely reflecting chronic interstitial lung disease when correlating with prior CT. No frank interstitial edema. No definite pleural effusions.  No pneumothorax. Surgical clips along the left chest wall/axilla. IMPRESSION: Stable cardiomegaly with suspected pericardial effusion. Electronically Signed   By: Charline Bills M.D.   On: 06/04/2023 18:51    Pertinent labs & imaging results that were available during my care of the patient were reviewed by me and considered in my medical decision making (see MDM for details).  Medications Ordered in ED Medications  furosemide (LASIX) injection 40 mg (40 mg Intravenous Given 06/04/23 2124)                                                                                                                                      Procedures Procedures  (including critical care time)  Medical Decision Making / ED Course   MDM:  87 year old female presenting to the emergency department shortness of breath.  Patient is overall well-appearing, not hypoxic, not tachycardic, physical exam with some signs of volume overload with lower extremity edema, JVD.  She also has a murmur.  Differential for symptoms include worsening pulmonary hypertension, related to known pericardial effusion, worsening aortic stenosis, systolic heart failure.  Less likely pulmonary hypertension with no tachycardia, hypoxia, recent travel or surgeries, chest pain.  BNP obtained yesterday was significantly elevated.  Reviewed pulmonology note from yesterday which felt that her symptoms were possibly due to worsening pulmonary hypertension versus other causes and felt that patient would need to be admitted for further cardiac workup.  Patient is agreeable to admission for this.  Will give dose of Lasix.  Discussed with Dr. Loney Loh who will admit the patient.      Additional history obtained: -Additional history obtained from spouse -External records from outside source obtained and reviewed including: Chart review including previous notes, labs, imaging, consultation notes including pulmonology note from uyesterday   Lab Tests: -I ordered, reviewed, and interpreted labs.   The pertinent results include:   Labs Reviewed  BASIC METABOLIC PANEL - Abnormal; Notable for the following components:      Result Value   Sodium 133 (*)    Chloride 96 (*)    BUN 34 (*)    Creatinine, Ser 1.26 (*)    GFR, Estimated 42 (*)    All other components within normal limits  CBC - Abnormal; Notable for the following components:   RBC 2.69 (*)    Hemoglobin 9.4 (*)    HCT 30.0 (*)    MCV 111.5 (*)    MCH 34.9 (*)    All other components within normal limits  TROPONIN I (HIGH  SENSITIVITY) - Abnormal; Notable for the following components:  Troponin I (High Sensitivity) 19 (*)    All other components within normal limits  BRAIN NATRIURETIC PEPTIDE  TROPONIN I (HIGH SENSITIVITY)    Notable for mild elevated troponin likely demand   EKG   EKG Interpretation Date/Time:  Thursday June 04 2023 16:17:13 EDT Ventricular Rate:  87 PR Interval:  196 QRS Duration:  78 QT Interval:  334 QTC Calculation: 401 R Axis:   55  Text Interpretation: Normal sinus rhythm Low voltage QRS Cannot rule out Anterior infarct , age undetermined Abnormal ECG When compared with ECG of 10-Oct-2022 19:16, PREVIOUS ECG IS PRESENT Confirmed by Alvino Blood ([id number]) on 06/04/2023 9:32:50 PM         Imaging Studies ordered: I ordered imaging studies including CXR On my interpretation imaging demonstrates enlarged cardiac silhouette  I independently visualized and interpreted imaging. I agree with the radiologist interpretation   Medicines ordered and prescription drug management: Meds ordered this encounter  Medications   furosemide (LASIX) injection 40 mg    -I have reviewed the patients home medicines and have made adjustments as needed   Consultations Obtained: I requested consultation with the hospitalist,  and discussed lab and imaging findings as well as pertinent plan - they recommend: admission   Cardiac Monitoring: The patient was maintained on a cardiac monitor.  I personally viewed and interpreted the cardiac monitored which showed an underlying rhythm of: NSR   Reevaluation: After the interventions noted above, I reevaluated the patient and found that their symptoms have improved  Co morbidities that complicate the patient evaluation  Past Medical History:  Diagnosis Date   Anemia    Angiodysplasia of stomach and duodenum    Arthus phenomenon    AVM (arteriovenous malformation)    Blood transfusion without reported diagnosis    last 4 units 12-22-15,  Iron infusion x2 last -01-07-16,01-14-16.   Breast cancer (HCC) 1989   Left   Candida esophagitis (HCC)    Cataract    Chronic kidney disease    Chronic mild renal insuffiency   CREST syndrome (HCC)    GERD (gastroesophageal reflux disease)    w/ HH   Interstitial lung disease (HCC)    Nodule of kidney    Pericardial effusion    PONV (postoperative nausea and vomiting)    Pulmonary embolus (HCC) 2003   Pulmonary hypertension (HCC)    followed by Dr Bland Span at [facility], now Dr. Penni Bombard visit end 2'17.   Rectal incontinence    Renal cell carcinoma (HCC)    Scleroderma (HCC)    Tubular adenoma of colon    Uterine polyp       Dispostion: Disposition decision including need for hospitalization was considered, and patient admitted to the hospital.    Final Clinical Impression(s) / ED Diagnoses Final diagnoses:  Hypervolemia, unspecified hypervolemia type     This chart was dictated using voice recognition software.  Despite best efforts to proofread,  errors can occur which can change the documentation meaning.    Lonell Grandchild, MD 06/04/23 2155

## 2023-06-04 NOTE — Telephone Encounter (Signed)
I called Dr. Francine Graven because PT told me she was waiting for his call all morning. Dr. York Spaniel he needs to look at her labs but he assured me he'd call her back one he logged on. He is off today.

## 2023-06-04 NOTE — Telephone Encounter (Signed)
Paula Ward  was concered about her heart  function with very high BNP and will need an expedited w/u by cardiology as she is already established there.  If can't get seen this week for echo and breathing worse, then does need to go to [facility] for inpt w/u.(She sees Crenshaw so send him a copy of this phone note)

## 2023-06-04 NOTE — ED Triage Notes (Signed)
Pt presents with ShOB and chest tightness x 2-3 weeks. Pt has associated leg swelling, and orthopnea. Pt's pulmonologist increased pt's Lasix for a few days and pt saw improvement, but symptoms returned.

## 2023-06-04 NOTE — Progress Notes (Addendum)
Monitoring by Pharmacy for Pulmonary Hypertension Treatment   Indication - Continuation of prior to admission medication   Patient is 87 y.o.  with history of PAH on chronic macitentan (OPSUMIT) PTA and will be continued while hospitalized.   Continuing this medication order as an inpatient requires that monitoring parameters per REMS requirements must be met.  Chronic therapy is under the supervision of Dr Marlane Mingle at [facility] who is enrolled in the REMS program and is being notified of continuation of therapy.  Per patient report has previously been educated on Pregnancy risk and Hepatotoxicity. On admission pregnancy risk has been assessed and no monitoring required given patient is post-menopausal.  Hepatic function has been evaluated. AST / ALT appropriate to continue medication at this time.     Latest Ref Rng & Units 06/03/2023   12:00 PM 04/16/2023    2:31 PM 03/25/2023    9:38 AM  Hepatic Function  Total Protein 6.0 - 8.3 g/dL 6.7  6.7  6.9   Albumin 3.5 - 5.2 g/dL 4.2  4.2  4.3   AST 0 - 37 U/L 19  22  23    ALT 0 - 35 U/L 8  10  10    Alk Phosphatase 39 - 117 U/L 62  68  61   Total Bilirubin 0.2 - 1.2 mg/dL 0.6  0.7  0.7     If any question arise or pregnancy is identified during hospitalization, contact for bosentan: [phone]; macitentan: [phone]; ambrisentan: [phone].  Thank you for allowing pharmacy to participate in this patient's care,  Sherron Monday, PharmD, BCCCP Clinical Pharmacist  Phone: [phone] 06/04/2023 11:51 PM  Please check AMION for all [facility] Pharm[facility] phone numbers After 10:00 PM, call Main Pharmacy [phone]

## 2023-06-05 ENCOUNTER — Inpatient Hospital Stay (HOSPITAL_COMMUNITY): Payer: Medicare PPO

## 2023-06-05 ENCOUNTER — Encounter (HOSPITAL_COMMUNITY): Payer: Self-pay | Admitting: Internal Medicine

## 2023-06-05 DIAGNOSIS — I5033 Acute on chronic diastolic (congestive) heart failure: Secondary | ICD-10-CM

## 2023-06-05 DIAGNOSIS — I5031 Acute diastolic (congestive) heart failure: Secondary | ICD-10-CM | POA: Diagnosis not present

## 2023-06-05 LAB — BASIC METABOLIC PANEL
Anion gap: 9 (ref 5–15)
BUN: 33 mg/dL — ABNORMAL HIGH (ref 8–23)
CO2: 30 mmol/L (ref 22–32)
Calcium: 9.3 mg/dL (ref 8.9–10.3)
Chloride: 94 mmol/L — ABNORMAL LOW (ref 98–111)
Creatinine, Ser: 1.02 mg/dL — ABNORMAL HIGH (ref 0.44–1.00)
GFR, Estimated: 54 mL/min — ABNORMAL LOW (ref >60)
Glucose, Bld: 107 mg/dL — ABNORMAL HIGH (ref 70–99)
Potassium: 4.4 mmol/L (ref 3.5–5.1)
Sodium: 133 mmol/L — ABNORMAL LOW (ref 135–145)

## 2023-06-05 LAB — ECHOCARDIOGRAM COMPLETE
AR max vel: 1.07 cm2
AV Area VTI: 0.95 cm2
AV Area mean vel: 0.94 cm2
AV Mean grad: 37 mmHg
AV Peak grad: 58.7 mmHg
Ao pk vel: 3.83 m/s
Area-P 1/2: 4.17 cm2
Height: 63 in
P 1/2 time: 71.5 ms
S' Lateral: 2 cm
Weight: 1710.77 [oz_av]

## 2023-06-05 LAB — BRAIN NATRIURETIC PEPTIDE: B Natriuretic Peptide: 2107.8 pg/mL — ABNORMAL HIGH (ref 0.0–100.0)

## 2023-06-05 MED ORDER — FUROSEMIDE 10 MG/ML IJ SOLN
40.0000 mg | Freq: Once | INTRAMUSCULAR | Status: AC
  Administered 2023-06-05: 40 mg via INTRAVENOUS
  Filled 2023-06-05: qty 4

## 2023-06-05 NOTE — Evaluation (Signed)
Occupational Therapy Evaluation Patient Details Name: Paula Ward MRN: [id number] DOB: 06-26-1936 Today's Date: 06/05/2023   History of Present Illness 87 y.o. female presents to [facility] hospital on 06/04/2023 with SOB, chest tightness, orthopnea and LE edema. Pt admitted for management of fluid overload. PMH includes scleroderma, crest syndrome, ILD, PAH, pericardial effusion, CKDIII, AVM of small bowel, chronic respiratory failure, breast cancer, renal cell carcinoma, GERD.   Clinical Impression   Pt currently at min to min guard assist for simulated selfcare tasks and toileting without an assistive device.  Oxygen sats were 88-89% on room air at rest and with activity.  Pt with slight decreased balance noted and was using a cane at times at home.  She lives with her spouse who assists with kitchen tasks per her report but she does all of the driving.  Feel she will benefit from short term acute care OT to help increase endurance and independence with basic ADLs.  Recommend HHOT for continued progression post acute stay.         If plan is discharge home, recommend the following: A little help with walking and/or transfers;Assistance with cooking/housework;Help with stairs or ramp for entrance;A little help with bathing/dressing/bathroom    Functional Status Assessment  Patient has had a recent decline in their functional status and demonstrates the ability to make significant improvements in function in a reasonable and predictable amount of time.  Equipment Recommendations  None recommended by OT       Precautions / Restrictions Precautions Precautions: Fall Restrictions Weight Bearing Restrictions: No      Mobility Bed Mobility Overal bed mobility: Modified Independent                  Transfers Overall transfer level: Needs assistance Equipment used: None Transfers: Sit to/from Stand, Bed to chair/wheelchair/BSC Sit to Stand: Min assist, Contact guard assist      Step pivot transfers: Contact guard assist            Balance Overall balance assessment: Needs assistance Sitting-balance support: No upper extremity supported, Feet supported Sitting balance-Leahy Scale: Good     Standing balance support: No upper extremity supported, During functional activity Standing balance-Leahy Scale: Fair Standing balance comment: Pt needs at least unilateral UE support during mobility.                           ADL either performed or assessed with clinical judgement   ADL Overall ADL's : Needs assistance/impaired Eating/Feeding: Independent;Sitting   Grooming: Wash/dry hands;Wash/dry face;Standing;Contact guard assist Grooming Details (indicate cue type and reason): simulated Upper Body Bathing: Set up;Sitting Upper Body Bathing Details (indicate cue type and reason): simulated Lower Body Bathing: Sit to/from stand;Minimal assistance Lower Body Bathing Details (indicate cue type and reason): simulated Upper Body Dressing : Sitting;Set up Upper Body Dressing Details (indicate cue type and reason): simulated Lower Body Dressing: Sit to/from stand;Minimal assistance   Toilet Transfer: Minimal assistance;Grab bars;Comfort height toilet Toilet Transfer Details (indicate cue type and reason): min assist Toileting- Clothing Manipulation and Hygiene: Minimal assistance;Sit to/from stand       Functional mobility during ADLs: Minimal assistance (no assistive device) General ADL Comments: Pt currently at min to min guard assist for transfers and simulated LB selfcare sit to stand.  Oxygen sats at 88-89% on room air at rest and with activity.  Other vitals stable throughout.     Vision Baseline Vision/History: 1 Wears glasses (for near vision)  Ability to See in Adequate Light: 0 Adequate Patient Visual Report: No change from baseline Vision Assessment?: No apparent visual deficits     Perception Perception: Within Functional Limits        Praxis Praxis: WFL       Pertinent Vitals/Pain Pain Assessment Pain Assessment: No/denies pain     Extremity/Trunk Assessment Upper Extremity Assessment Upper Extremity Assessment: Overall WFL for tasks assessed   Lower Extremity Assessment Lower Extremity Assessment: Defer to PT evaluation   Cervical / Trunk Assessment Cervical / Trunk Assessment: Normal   Communication Communication Communication: No apparent difficulties   Cognition Arousal: Alert Behavior During Therapy: WFL for tasks assessed/performed Overall Cognitive Status: Within Functional Limits for tasks assessed                                       General Comments  VSS on RA, pt with brief desaturation to 89% at completion of ambulation, recovers to 93% with pursed lip breathing when seated            Home Living Family/patient expects to be discharged to:: Private residence Living Arrangements: Spouse/significant other Available Help at Discharge: Available 24 hours/day;Family (spouse uses cane and walker) Type of Home: House Home Access: Stairs to enter Secretary/administrator of Steps: 3 Entrance Stairs-Rails: Right Home Layout: One level     Bathroom Shower/Tub: Producer, television/film/video: Standard     Home Equipment: Grab bars - tub/shower;Cane - single Librarian, academic (2 wheels);Other (comment);Shower seat;Hand held shower head   Additional Comments: Home O2 used mostly at night but used sometimes during the day with exertion.      Prior Functioning/Environment Prior Level of Function : Independent/Modified Independent             Mobility Comments: ambulatory with SPC in the community, no DME in the home ADLs Comments: Pt was modified independent with bathing, dressing.  She states she does her cooking sitting on a stool.        OT Problem List: Impaired balance (sitting and/or standing)      OT Treatment/Interventions: Self-care/ADL  training;Patient/family education;Therapeutic exercise;Balance training;Therapeutic activities;DME and/or AE instruction;Energy conservation    OT Goals(Current goals can be found in the care plan section) Acute Rehab OT Goals Patient Stated Goal: Pt wants to get her balance better. OT Goal Formulation: With patient Time For Goal Achievement: 06/19/23 Potential to Achieve Goals: Good  OT Frequency: Min 1X/week       AM-PAC OT "6 Clicks" Daily Activity     Outcome Measure Help from another person eating meals?: None Help from another person taking care of personal grooming?: A Little Help from another person toileting, which includes using toliet, bedpan, or urinal?: A Little Help from another person bathing (including washing, rinsing, drying)?: A Little Help from another person to put on and taking off regular upper body clothing?: A Little Help from another person to put on and taking off regular lower body clothing?: A Little 6 Click Score: 19   End of Session Nurse Communication: Mobility status;Other (comment) (O2 sats)  Activity Tolerance: Patient tolerated treatment well Patient left: in bed;with call bell/phone within reach  OT Visit Diagnosis: Unsteadiness on feet (R26.81)                Time: 3086-5784 OT Time Calculation (min): 43 min Charges:  OT General Charges $OT Visit: 1 Visit  OT Evaluation $OT Eval Moderate Complexity: 1 Mod OT Treatments $Self Care/Home Management : 23-37 mins Perrin Maltese, OTR/L Acute Rehabilitation Services  Office [phone] 06/05/2023

## 2023-06-05 NOTE — Telephone Encounter (Signed)
Patient is admitted to hospital

## 2023-06-05 NOTE — Evaluation (Signed)
Physical Therapy Evaluation Patient Details Name: Paula Ward MRN: [id number] DOB: 17-Jul-1936 Today's Date: 06/05/2023  History of Present Illness  87 y.o. female presents to [facility] hospital on 06/04/2023 with SOB, chest tightness, orthopnea and LE edema. Pt admitted for management of fluid overload. PMH includes scleroderma, crest syndrome, ILD, PAH, pericardial effusion, CKDIII, AVM of small bowel, chronic respiratory failure, breast cancer, renal cell carcinoma, GERD.  Clinical Impression  Pt presents to PT with deficits in gait, balance, endurance, cardiopulmonary function. Pt fatigues more quickly than at baseline, requiring standing rest breaks and briefly desaturating to 89% at the end of ambulation. PT encourages the use of SPC when ambulating in the home in addition to her typical use in the community as the pt appears much more stable with UE support. PT will continue to follow this admission, anticipate no post-acute needs.        If plan is discharge home, recommend the following: A little help with bathing/dressing/bathroom;Assistance with cooking/housework;Assist for transportation;Help with stairs or ramp for entrance   Can travel by private vehicle        Equipment Recommendations None recommended by PT  Recommendations for Other Services       Functional Status Assessment Patient has had a recent decline in their functional status and demonstrates the ability to make significant improvements in function in a reasonable and predictable amount of time.     Precautions / Restrictions Restrictions Weight Bearing Restrictions: No      Mobility  Bed Mobility Overal bed mobility: Modified Independent                  Transfers Overall transfer level: Needs assistance Equipment used: None Transfers: Sit to/from Stand, Bed to chair/wheelchair/BSC Sit to Stand: Supervision   Step pivot transfers: Supervision            Ambulation/Gait Ambulation/Gait  assistance: Supervision Gait Distance (Feet): 200 Feet (standing rest break 100' into walk) Assistive device: Straight cane Gait Pattern/deviations: Step-through pattern Gait velocity: reduced Gait velocity interpretation: <1.8 ft/sec, indicate of risk for recurrent falls   General Gait Details: pt with widened BOS with brief periods without use of SPC  Stairs            Wheelchair Mobility     Tilt Bed    Modified Rankin (Stroke Patients Only)       Balance Overall balance assessment: Needs assistance Sitting-balance support: No upper extremity supported, Feet supported Sitting balance-Leahy Scale: Good     Standing balance support: No upper extremity supported, During functional activity Standing balance-Leahy Scale: Fair                               Pertinent Vitals/Pain Pain Assessment Pain Assessment: No/denies pain    Home Living Family/patient expects to be discharged to:: Private residence Living Arrangements: Spouse/significant other Available Help at Discharge: Available 24 hours/day;Family Type of Home: House Home Access: Stairs to enter Entrance Stairs-Rails: Right Entrance Stairs-Number of Steps: 3   Home Layout: One level Home Equipment: Grab bars - tub/shower;Cane - single Librarian, academic (2 wheels);Other (comment) Additional Comments: Home O2 used mostly at night but used sometimes during the day with exertion.    Prior Function Prior Level of Function : Independent/Modified Independent             Mobility Comments: ambulatory with SPC in the community, no DME in the home  Extremity/Trunk Assessment   Upper Extremity Assessment Upper Extremity Assessment: Overall WFL for tasks assessed    Lower Extremity Assessment Lower Extremity Assessment: Generalized weakness (pt reports she believes she has a LLD, with LLE shorter than R. This does appear to be the case with PT evaluation as L iliac crest sits lower  than R)    Cervical / Trunk Assessment Cervical / Trunk Assessment: Normal  Communication   Communication Communication: No apparent difficulties  Cognition Arousal: Alert Behavior During Therapy: WFL for tasks assessed/performed Overall Cognitive Status: Within Functional Limits for tasks assessed                                          General Comments General comments (skin integrity, edema, etc.): VSS on RA, pt with brief desaturation to 89% at completion of ambulation, recovers to 93% with pursed lip breathing when seated    Exercises     Assessment/Plan    PT Assessment Patient needs continued PT services  PT Problem List Decreased activity tolerance;Decreased balance;Decreased mobility;Cardiopulmonary status limiting activity       PT Treatment Interventions DME instruction;Gait training;Stair training;Functional mobility training;Therapeutic activities;Therapeutic exercise;Balance training;Neuromuscular re-education;Patient/family education    PT Goals (Current goals can be found in the Care Plan section)  Acute Rehab PT Goals Patient Stated Goal: to go home PT Goal Formulation: With patient Time For Goal Achievement: 06/19/23 Potential to Achieve Goals: Good Additional Goals Additional Goal #1: Pt will score >19/24 on the DGI to indicate a reduced risk for falls    Frequency Min 1X/week     Co-evaluation               AM-PAC PT "6 Clicks" Mobility  Outcome Measure Help needed turning from your back to your side while in a flat bed without using bedrails?: None Help needed moving from lying on your back to sitting on the side of a flat bed without using bedrails?: None Help needed moving to and from a bed to a chair (including a wheelchair)?: A Little Help needed standing up from a chair using your arms (e.g., wheelchair or bedside chair)?: A Little Help needed to walk in hospital room?: A Little Help needed climbing 3-5 steps with a  railing? : A Little 6 Click Score: 20    End of Session   Activity Tolerance: Patient limited by fatigue Patient left: in bed;with call bell/phone within reach;with bed alarm set Nurse Communication: Mobility status PT Visit Diagnosis: Other abnormalities of gait and mobility (R26.89)    Time: 6213-0865 PT Time Calculation (min) (ACUTE ONLY): 26 min   Charges:   PT Evaluation $PT Eval Low Complexity: 1 Low   PT General Charges $$ ACUTE PT VISIT: 1 Visit         Arlyss Gandy, PT, DPT Acute Rehabilitation Office [phone]   Arlyss Gandy 06/05/2023, 12:30 PM

## 2023-06-05 NOTE — Progress Notes (Signed)
PROGRESS NOTE    Paula Ward  ZOX:[id number] DOB: 28-Aug-1936 DOA: 06/04/2023 PCP: [name], MD  Outpatient Specialists:     Brief Narrative:  Patient is an 87 year old female with past medical history significant for scleroderma, crest syndrome, ILD, pulmonary hypertension, pericardial effusion, chronic kidney disease stage IIIb, AVMs of the small bowel, chronic respiratory failure on supplemental oxygen at nighttime, history of breast cancer and renal cancer.  Patient also carries diagnosis of mitral valve stenosis, moderate aortic regurgitation, mild to moderate tricuspid regurgitation, ERD, PE, B12 deficiency and chronic microcytic anemia.  Patient was admitted with worsening shortness of breath, orthopnea, chest tightness and bilateral lower extremity edema.  Rising cardiac BNP was reported (from 1750-2680, despite increased diuretics).  Patient has been admitted for further evaluation and management.  06/05/2023: Patient seen.  Patient is currently undergoing echocardiogram.  Cardiology team has been consulted.   Assessment & Plan:   Principal Problem:   Volume overload Active Problems:   Pulmonary hypertension (HCC)   GASTROESOPHAGEAL REFLUX DISEASE   CKD (chronic kidney disease) stage 3, GFR 30-59 ml/min (HCC)   Acute CHF (HCC)   Macrocytic anemia   Volume overload Acute CHF History of pulmonary hypertension, pericardial effusion, mitral stenosis, aortic regurgitation: -Patient presented with worsening dyspnea, orthopnea, and bilateral lower extremity edema despite doubling the dose of her home diuretics recently. -BNP was 1750 on labs done 05/13/2023 and 2680 on repeat labs done yesterday. -Patient was sent to the ED by pulmonology. -Chest x-ray showing stable cardiomegaly with suspected pericardial effusion; no pulmonary edema or pleural effusions.  Pericardial effusion appears to be chronic, no signs of tamponade. -Last echo done in March 2024 showing EF 60  to 65%, grade 1 diastolic dysfunction, moderately elevated pulmonary artery systolic pressure, large pericardial effusion, mild to moderate mitral stenosis, mild to moderate tricuspid valve regurgitation, and moderate aortic valve regurgitation. -Patient has been on IV Lasix. -Symptoms are improving. -Cardiology team has been consulted. -Repeat echocardiogram is currently going on. -Further management will depend on hospital course.     Chest tightness -Flat troponin (19-22) -EKG without acute ischemic changes. -Patient is currently chest pain-free and appears comfortable.   Chronic macrocytic anemia History of B12 deficiency Hemoglobin close to baseline.   Mild hyponatremia -Mild and chronic.     CKD stage IIIa/b Creatinine 1.02, stable.   Continue to monitor renal function.   GERD Continue PPI and H2 blocker.   Pulmonary hypertension Continue macitentan.  Repeat echocardiogram ordered.   Pericardial effusion Appears to be chronic, no signs of tamponade. Follow repeat echocardiogram ordered.   DVT prophylaxis: Subcutaneous Lovenox Code Status: Full code Family Communication:  Disposition Plan: Home eventually   Consultants:  Cardiology  Procedures:  Echo result is pending  Antimicrobials:  None   Subjective: Admitted with worsening shortness of breath, orthopnea and leg edema.  Objective: Vitals:   06/04/23 2339 06/05/23 0341 06/05/23 0711 06/05/23 0817  BP: (!) 136/53 136/66 129/64   Pulse: 80 88 88 85  Resp: 20 16 18    Temp: 97.9 F (36.6 C) 97.7 F (36.5 C) 97.8 F (36.6 C)   TempSrc: Oral Oral Oral   SpO2: 92% 98% 93% 93%  Weight:  48.5 kg    Height:        Intake/Output Summary (Last 24 hours) at 06/05/2023 1059 Last data filed at 06/05/2023 0100 Gross per 24 hour  Intake 120 ml  Output 450 ml  Net -330 ml   [employer]  06/04/23 1658 06/05/23 0341  Weight: 45.8 kg 48.5 kg    Examination:  General exam: Appears calm and  comfortable  Respiratory system: Clear to auscultation.  Cardiovascular system: S1 & S2 with systolic murmur. Gastrointestinal system: Abdomen is soft and nontender.  Central nervous system: Alert and oriented.  Patient moves extremities.   Data Reviewed: I have personally reviewed following labs and imaging studies  CBC: Recent Labs  Lab 06/03/23 1200 06/04/23 1713  WBC 4.4 6.1  NEUTROABS 3.2  --   HGB 10.0* 9.4*  HCT 30.7* 30.0*  MCV 107.1* 111.5*  PLT 173.0 172   Basic Metabolic Panel: Recent Labs  Lab 06/03/23 1200 06/04/23 1713 06/04/23 2353  NA 132* 133* 133*  K 4.3 4.7 4.4  CL 93* 96* 94*  CO2 31 24 30   GLUCOSE 89 92 107*  BUN 36* 34* 33*  CREATININE 1.16 1.26* 1.02*  CALCIUM 9.5 8.9 9.3   GFR: Estimated Creatinine Clearance: 31.4 mL/min (A) (by C-G formula based on SCr of 1.02 mg/dL (H)). Liver Function Tests: Recent Labs  Lab 06/03/23 1200  AST 19  ALT 8  ALKPHOS 62  BILITOT 0.6  PROT 6.7  ALBUMIN 4.2   No results for input(s): "LIPASE", "AMYLASE" in the last 168 hours. No results for input(s): "AMMONIA" in the last 168 hours. Coagulation Profile: No results for input(s): "INR", "PROTIME" in the last 168 hours. Cardiac Enzymes: No results for input(s): "CKTOTAL", "CKMB", "CKMBINDEX", "TROPONINI" in the last 168 hours. BNP (last 3 results) Recent Labs    05/13/23 1507 06/03/23 1200  PROBNP 1,750.0* 2,680.0*   HbA1C: No results for input(s): "HGBA1C" in the last 72 hours. CBG: No results for input(s): "GLUCAP" in the last 168 hours. Lipid Profile: No results for input(s): "CHOL", "HDL", "LDLCALC", "TRIG", "CHOLHDL", "LDLDIRECT" in the last 72 hours. Thyroid Function Tests: No results for input(s): "TSH", "T4TOTAL", "FREET4", "T3FREE", "THYROIDAB" in the last 72 hours. Anemia Panel: No results for input(s): "VITAMINB12", "FOLATE", "FERRITIN", "TIBC", "IRON", "RETICCTPCT" in the last 72 hours. Urine analysis:    Component Value Date/Time    COLORURINE YELLOW 03/13/2023 1512   APPEARANCEUR CLEAR 03/13/2023 1512   LABSPEC 1.020 03/13/2023 1512   PHURINE 5.5 03/13/2023 1512   GLUCOSEU NEGATIVE 03/13/2023 1512   HGBUR NEGATIVE 03/13/2023 1512   BILIRUBINUR negative 04/03/2023 1532   BILIRUBINUR Neg 09/03/2017 1529   KETONESUR negative 04/03/2023 1532   KETONESUR TRACE (A) 03/13/2023 1512   PROTEINUR negative 04/03/2023 1532   PROTEINUR NEGATIVE 03/30/2021 1416   UROBILINOGEN 0.2 04/03/2023 1532   UROBILINOGEN 1.0 03/13/2023 1512   NITRITE Negative 04/03/2023 1532   NITRITE NEGATIVE 03/13/2023 1512   LEUKOCYTESUR Negative 04/03/2023 1532   LEUKOCYTESUR NEGATIVE 03/13/2023 1512   Sepsis Labs: @LABRCNTIP (procalcitonin:4,lacticidven:4)  )No results found for this or any previous visit (from the past 240 hour(s)).       Radiology Studies: DG Chest 2 View  Result Date: 06/04/2023 CLINICAL DATA:  Shortness of breath, chest tightness EXAM: CHEST - 2 VIEW COMPARISON:  Chest radiograph dated 05/13/2023. CT chest dated 05/13/2022 FINDINGS: Stable cardiomegaly. Pericardial effusion is suspected, as noted on prior CT. Chronic additional markings, likely reflecting chronic interstitial lung disease when correlating with prior CT. No frank interstitial edema. No definite pleural effusions.  No pneumothorax. Surgical clips along the left chest wall/axilla. IMPRESSION: Stable cardiomegaly with suspected pericardial effusion. Electronically Signed   By: Charline Bills M.D.   On: 06/04/2023 18:51        Scheduled Meds:  docusate sodium  100 mg Oral BID   enoxaparin (LOVENOX) injection  30 mg Subcutaneous Q24H   famotidine  20 mg Oral QHS   ipratropium  2 spray Each Nare Q12H   latanoprost  1 drop Both Eyes QHS   macitentan  10 mg Oral Daily   mirtazapine  15 mg Oral QHS   montelukast  10 mg Oral QHS   pantoprazole  40 mg Oral Daily   Continuous Infusions:   LOS: 1 day    Time spent: 35 minutes.    Berton Mount, MD  [facility] Pager #: [phone] 7PM-7AM contact night coverage as above

## 2023-06-05 NOTE — Progress Notes (Signed)
Echocardiogram 2D Echocardiogram has been performed.  Kymorah Korf N Lilinoe Acklin,RDCS 06/05/2023, 11:31 AM

## 2023-06-05 NOTE — TOC Initial Note (Signed)
Transition of Care [facility]) - Initial/Assessment Note    Patient Details  Name: Paula Ward MRN: [id number] Date of Birth: 1936/08/28  Transition of Care [facility]) CM/SW Contact:    Leone Haven, RN Phone Number: 06/05/2023, 4:54 PM  Clinical Narrative:                 From home with spouse, has PCP and insurance on file, states has no HH services in place at this time,  per pt eval rec no pt f/u, she states she does not need occupational therapy at this time , she does have home oxygen 2 liters with [employer] at night, use a cane when outside.  States daughter will transport her home at [employer] and family is support system, states gets medications from [facility].  Pta walks with cane.   Expected Discharge Plan: Home/Self Care Barriers to Discharge: Continued Medical Work up   Patient Goals and CMS Choice Patient states their goals for this hospitalization and ongoing recovery are:: return home   Choice offered to / list presented to : NA      Expected Discharge Plan and Services In-house Referral: NA Discharge Planning Services: CM Consult Post Acute Care Choice: NA Living arrangements for the past 2 months: Single Family Home                 DME Arranged: N/A DME Agency: NA       HH Arranged: NA          Prior Living Arrangements/Services Living arrangements for the past 2 months: Single Family Home Lives with:: Spouse Patient language and need for interpreter reviewed:: Yes Do you feel safe going back to the place where you live?: Yes      Need for Family Participation in Patient Care: Yes (Comment) Care giver support system in place?: Yes (comment) Current home services: DME (home oxygen 2 liters with [employer] at night, cane) Criminal Activity/Legal Involvement Pertinent to Current Situation/Hospitalization: No - Comment as needed  Activities of Daily Living Home Assistive Devices/Equipment: Cane (specify quad or straight), Eyeglasses,  Oxygen ADL Screening (condition at time of admission) Patient's cognitive ability adequate to safely complete daily activities?: Yes Is the patient deaf or have difficulty hearing?: No Does the patient have difficulty seeing, even when wearing glasses/contacts?: No Does the patient have difficulty concentrating, remembering, or making decisions?: No Patient able to express need for assistance with ADLs?: Yes Does the patient have difficulty dressing or bathing?: No Independently performs ADLs?: Yes (appropriate for developmental age) Does the patient have difficulty walking or climbing stairs?: No Weakness of Legs: Left Weakness of Arms/Hands: None  Permission Sought/Granted Permission sought to share information with : Case Manager Permission granted to share information with : Yes, Verbal Permission Granted              Emotional Assessment Appearance:: Appears stated age Attitude/Demeanor/Rapport: Engaged Affect (typically observed): Appropriate Orientation: : Oriented to Place, Oriented to Self, Oriented to  Time, Oriented to Situation Alcohol / Substance Use: Not Applicable Psych Involvement: No (comment)  Admission diagnosis:  Volume overload [E87.70] Hypervolemia, unspecified hypervolemia type [E87.70] Patient Active Problem List   Diagnosis Date Noted   Volume overload 06/04/2023   Acute CHF (HCC) 06/04/2023   Macrocytic anemia 06/04/2023   Orthostatic hypotension 05/21/2023   Vitamin B12 deficiency 03/02/2023   Closed comminuted intertrochanteric fracture of left femur (HCC) 10/10/2022   Chronic respiratory failure with hypoxia (HCC) 10/10/2022   Adjustment  disorder 10/02/2022   Moderate aortic stenosis 04/26/2022   Aortic stenosis, moderate 10/29/2021   Generalized weakness 03/29/2021   Chronic diastolic heart failure (HCC) 03/29/2021   AVM (arteriovenous malformation) of small bowel, acquired    Anemia, chronic disease 12/25/2020   Globus pharyngeus  08/19/2019   Dry mouth 02/15/2018   Allergic rhinitis 11/05/2017   Cerebellar 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 (HCC) 10/26/2017   Pericardial effusion 01/21/2016   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HCC)    Osteoarthritis 02/25/2015   History of pulmonary embolism 01/29/2013   CKD (chronic kidney disease) stage 3, GFR 30-59 ml/min (HCC) 09/01/2012   RECTAL INCONTINENCE 03/27/2009   Angiodysplasia of stomach and duodenum 08/22/2008   ADENOCARCINOMA, BREAST, LEFT 07/26/2007   Anemia, iron deficiency 07/26/2007   Essential hypertension 07/26/2007   Pulmonary hypertension (HCC) 07/26/2007   GASTROESOPHAGEAL REFLUX DISEASE 07/26/2007   SCLERODERMA 07/26/2007   ILD (interstitial lung disease) (HCC) 07/26/2007   PULMONARY EMBOLISM, HX OF 07/26/2007   PCP:  [name], MD Pharmacy:   [facility] [address], [place] - [address] [place] [address] Phone: [phone] Fax: [phone]  [facility] [address] [place] [address] Phone: [phone] Fax: [phone]  [facility] [place], [place] - [address] [place] [address] Phone: [phone] Fax: [phone]  [facility] [address] [place] ([place]), [place] - [address] [place] ([place]) [place] [address] Phone: [phone] Fax: [phone]     Social Determinants of Health (SDOH) Social History: SDOH Screenings   Food Insecurity: No Food Insecurity (06/04/2023)  Housing: Low Risk  (06/04/2023)  Transportation Needs: No Transportation Needs (06/04/2023)  Utilities: Not At Risk (06/04/2023)  Alcohol Screen: Low Risk  (04/02/2022)  Depression (PHQ2-9): Low Risk  (02/11/2023)  Financial Resource Strain: Low Risk  (04/02/2022)  Physical Activity: Inactive (04/02/2022)  Social Connections: Socially Integrated (04/02/2022)  Stress: No Stress  Concern Present (04/02/2022)  Tobacco Use: Low Risk  (06/05/2023)   SDOH Interventions:     Readmission Risk Interventions    10/12/2022    9:05 AM 03/18/2022    3:45 PM  Readmission Risk Prevention Plan  Transportation Screening Complete Complete  PCP or Specialist Appt within 3-5 Days  Complete  HRI or Home Care Consult  Complete  Social Work Consult for Recovery Care Planning/Counseling  Complete  Palliative Care Screening  Not Applicable  Medication Review Oceanographer) Complete Complete  PCP or Specialist appointment within 3-5 days of discharge Complete   HRI or Home Care Consult Complete   SW Recovery Care/Counseling Consult Complete   Palliative Care Screening Not Applicable   Skilled Nursing Facility Complete

## 2023-06-05 NOTE — Consult Note (Signed)
Cardiology Consultation   Patient ID: KYRI LATORRE MRN: [id number]; DOB: 1936/08/07  Admit date: 06/04/2023 Date of Consult: 06/05/2023  PCP:  [name], MD   [facility] Providers Cardiologist:  None        Patient Profile:   Paula Ward is a 87 y.o. female with a hx of scleroderma, crest syndrome, ILD, PAH, pericardial effusion, CKDIII, AVM of small bowel, chronic respiratory failure, breast cancer, renal cell carcinoma, GERD.  who is being seen 06/05/2023 for the evaluation of CHF at the request of Dr. Dartha Lodge.  History of Present Illness:   Ms. Costen presents on 06/04/2023 with SOB, chest tightness, orthopnea and LE edema. Her sats were 80s on RA.  BNP was 1750 on labs done 05/13/2023 and 2680 on repeat labs done yesterday. Per ED hx " States she was previously taking furosemide 20 mg daily and spironolactone 12.5 mg daily. States her pulmonologist doubled the dose of her diuretics to furosemide 40 mg daily and spironolactone 12.5 mg daily about 5 days ago but her symptoms have not improved. " Echo shows severely thickened LV, concentric. LV function is normal. Small cavity. Moderate closer to severe AS mean gradient 37 mmhg , AVA 0.95 cm2  She has a large pericardial effusion (seen in March). Negative inflows for cardiac tamponade. Clinically she is chronically frail. Blood pressures are normal.  No significant weight changes She sees Dr. Jens Som. He's been following her pericardial effusion. Has had this since ~ 2003. She also has hx of PHTN with hx of CREST syndrome/ILD. Prior cath in 04/2022 showed non obstructive coronary dx, mild pulmonary htn and moderate AS.   Wt Readings from Last 3 Encounters:  06/05/23 48.5 kg  06/03/23 47.6 kg  05/21/23 46.2 kg     Past Medical History:  Diagnosis Date   Anemia    Angiodysplasia of stomach and duodenum    Arthus phenomenon    AVM (arteriovenous malformation)    Blood transfusion without reported  diagnosis    last 4 units 12-22-15, Iron infusion x2 last -01-07-16,01-14-16.   Breast cancer (HCC) 1989   Left   Candida esophagitis (HCC)    Cataract    Chronic kidney disease    Chronic mild renal insuffiency   CREST syndrome (HCC)    GERD (gastroesophageal reflux disease)    w/ HH   Interstitial lung disease (HCC)    Nodule of kidney    Pericardial effusion    PONV (postoperative nausea and vomiting)    Pulmonary embolus (HCC) 2003   Pulmonary hypertension (HCC)    followed by Dr Bland Span at [facility], now Dr. Penni Bombard visit end 2'17.   Rectal incontinence    Renal cell carcinoma (HCC)    Scleroderma (HCC)    Tubular adenoma of colon    Uterine polyp     Past Surgical History:  Procedure Laterality Date   APPENDECTOMY  1962   BREAST LUMPECTOMY  1989   left   CATARACT EXTRACTION, BILATERAL Bilateral 12/2013   COLONOSCOPY WITH PROPOFOL N/A 04/15/2021   Procedure: COLONOSCOPY WITH PROPOFOL;  Surgeon: Rachael Fee, MD;  Location: [facility];  Service: Endoscopy;  Laterality: N/A;   ENTEROSCOPY N/A 01/18/2016   Procedure: ENTEROSCOPY;  Surgeon: Napoleon Form, MD;  Location: [facility];  Service: Endoscopy;  Laterality: N/A;   ENTEROSCOPY N/A 05/24/2018   Procedure: ENTEROSCOPY;  Surgeon: Lynann Bologna, MD;  Location: [facility];  Service: Endoscopy;  Laterality: N/A;   ENTEROSCOPY N/A  03/29/2021   Procedure: ENTEROSCOPY;  Surgeon: Beverley Fiedler, MD;  Location: [facility];  Service: Gastroenterology;  Laterality: N/A;   ENTEROSCOPY N/A 04/13/2021   Procedure: ENTEROSCOPY;  Surgeon: Benancio Deeds, MD;  Location: [facility];  Service: Gastroenterology;  Laterality: N/A;   ESOPHAGOGASTRODUODENOSCOPY (EGD) WITH PROPOFOL N/A 12/21/2015   Procedure: ESOPHAGOGASTRODUODENOSCOPY (EGD) WITH PROPOFOL;  Surgeon: Hilarie Fredrickson, MD;  Location: [facility];  Service: Endoscopy;  Laterality: N/A;   HOT HEMOSTASIS N/A 05/24/2018   Procedure: HOT HEMOSTASIS (ARGON PLASMA  COAGULATION/BICAP);  Surgeon: Lynann Bologna, MD;  Location: [facility];  Service: Endoscopy;  Laterality: N/A;   HOT HEMOSTASIS N/A 03/29/2021   Procedure: HOT HEMOSTASIS (ARGON PLASMA COAGULATION/BICAP);  Surgeon: Beverley Fiedler, MD;  Location: [facility];  Service: Gastroenterology;  Laterality: N/A;   HOT HEMOSTASIS N/A 04/13/2021   Procedure: HOT HEMOSTASIS (ARGON PLASMA COAGULATION/BICAP);  Surgeon: Benancio Deeds, MD;  Location: [facility];  Service: Gastroenterology;  Laterality: N/A;   INTRAMEDULLARY (IM) NAIL INTERTROCHANTERIC Left 10/11/2022   Procedure: INTRAMEDULLARY (IM) NAIL INTERTROCHANTERIC;  Surgeon: Ollen Gross, MD;  [facility]: [facility];  Service: Orthopedics;  Laterality: Left;   IR GENERIC HISTORICAL  06/05/2016   IR RADIOLOGIST EVAL & MGMT 06/05/2016 [name], MD [facility] INTERV RAD   IVC Filter     KIDNEY SURGERY     left -"laser surgery by Dr. Fredia Sorrow- 5 yrs ago" no removal   RIGHT/LEFT HEART CATH AND CORONARY ANGIOGRAPHY N/A 04/25/2022   Procedure: RIGHT/LEFT HEART CATH AND CORONARY ANGIOGRAPHY;  Surgeon: Tonny Bollman, MD;  Location: [facility] INVASIVE CV LAB;  Service: Cardiovascular;  Laterality: N/A;   SCHLEROTHERAPY  05/24/2018   Procedure: Theresia Majors;  Surgeon: Lynann Bologna, MD;  Location: [facility];  Service: Endoscopy;;   SUBMUCOSAL TATTOO INJECTION  04/13/2021   Procedure: SUBMUCOSAL TATTOO INJECTION;  Surgeon: Benancio Deeds, MD;  Location: [facility];  Service: Gastroenterology;;   TONSILLECTOMY     TUBAL LIGATION       Home Medications:  Prior to Admission medications   Medication Sig Start Date End Date Taking? Authorizing Provider  acetaminophen (TYLENOL) 325 MG tablet Take 2 tablets (650 mg total) by mouth every 6 (six) hours as needed for moderate pain. 07/18/22  Yes Wallis Bamberg, PA-C  Cyanocobalamin (VITAMIN B-12) 1000 MCG SUBL Place 1,000 mcg under the tongue. Taking twice a week   Yes [provider]  docusate sodium  (COLACE) 100 MG capsule Take 1 capsule (100 mg total) by mouth 2 (two) times daily. Patient taking differently: Take 100 mg by mouth 2 (two) times daily. Pt takes every morning 10/15/22  Yes Edmisten, Kristie L, PA  famotidine (PEPCID) 20 MG tablet Take 1 tablet (20 mg total) by mouth at bedtime. 02/10/23  Yes Nandigam, Eleonore Chiquito, MD  fluticasone (FLONASE) 50 MCG/ACT nasal spray Place 1 spray into both nostrils daily as needed for allergies. Patient taking differently: Place 1 spray into both nostrils in the morning and at bedtime. 05/15/22  Yes [name], MD  furosemide (LASIX) 20 MG tablet Take 1 tablet by mouth once daily 04/30/23  Yes [name], MD  guaiFENesin (MUCINEX) 600 MG 12 hr tablet Take 300 mg by mouth daily.   Yes [provider]  ipratropium (ATROVENT) 0.03 % nasal spray Place 2 sprays into both nostrils every 12 (twelve) hours. 05/12/22  Yes Martina Sinner, MD  latanoprost (XALATAN) 0.005 % ophthalmic solution Place 1 drop into both eyes at bedtime. 09/23/22  Yes [provider]  magic mouthwash SOLN Take 10 mLs by mouth 4 (four) times daily as needed for mouth pain. 03/22/22  Yes Burnadette Pop, MD  mirtazapine (REMERON) 15 MG tablet Take 1 tablet (15 mg total) by mouth at bedtime. 03/13/23  Yes [name], MD  montelukast (SINGULAIR) 10 MG tablet TAKE 1 TABLET BY MOUTH AT BEDTIME 04/27/23  Yes [name], MD  OPSUMIT 10 MG TABS Take 10 mg by mouth daily. 08/16/15  Yes [provider]  OXYGEN Inhale into the lungs. 2 LMP at night and upon exertion   Yes [provider]  pantoprazole (PROTONIX) 40 MG tablet Take 1 tablet (40 mg total) by mouth daily. 02/10/23  Yes Nandigam, Eleonore Chiquito, MD  spironolactone (ALDACTONE) 25 MG tablet Take 0.5 tablets (12.5 mg total) by mouth daily. 01/30/23  Yes [name], MD  sucralfate (CARAFATE) 1 GM/10ML suspension TAKE 10 MLS BY MOUTH EVERY 6 HOURS AS NEEDED FOR  REFLUX 02/11/23  Yes Nandigam, Eleonore Chiquito, MD  albuterol (VENTOLIN HFA) 108 (90 Base) MCG/ACT inhaler INHALE 2 PUFFS INTO THE LUNGS EVERY 6 HOURS AS NEEDED FOR WHEEZING OR SHORTNESS OF BREATH 03/13/23   [name], MD  citalopram (CELEXA) 10 MG tablet Take 1 tablet (10 mg total) by mouth daily. Patient not taking: Reported on 06/04/2023 09/29/22   [name], MD  clobetasol ointment (TEMOVATE) 0.05 % Apply 1 Application topically 2 (two) times daily. 04/20/23   [provider]  diltiazem (CARDIZEM CD) 120 MG 24 hr capsule Take 1 capsule (120 mg total) by mouth daily. Patient not taking: Reported on 06/03/2023 05/27/23 08/25/23  Lewayne Bunting, MD  diltiazem (CARDIZEM SR) 60 MG 12 hr capsule Take 60 mg by mouth daily. Patient not taking: Reported on 06/04/2023 05/11/23   [provider]  Multiple Vitamin (MULTIVITAMIN) capsule Take 1 capsule by mouth daily. Patient not taking: Reported on 06/04/2023    [provider]  mupirocin ointment (BACTROBAN) 2 % Apply 1 Application topically 2 (two) times daily as needed (dry skin). Patient not taking: Reported on 06/04/2023    [provider]  polyethylene glycol (MIRALAX / GLYCOLAX) 17 g packet Take 17 g by mouth daily. Patient not taking: Reported on 06/04/2023 10/15/22   Derenda Fennel, PA    Inpatient Medications: Scheduled Meds:  docusate sodium  100 mg Oral BID   enoxaparin (LOVENOX) injection  30 mg Subcutaneous Q24H   famotidine  20 mg Oral QHS   ipratropium  2 spray Each Nare Q12H   latanoprost  1 drop Both Eyes QHS   macitentan  10 mg Oral Daily   mirtazapine  15 mg Oral QHS   montelukast  10 mg Oral QHS   pantoprazole  40 mg Oral Daily   Continuous Infusions:  PRN Meds: acetaminophen **OR** acetaminophen  Allergies:    Allergies  Allergen Reactions   Codeine Nausea Only    Hallucinations, too   Other Nausea And Vomiting    "-mycin" antibiotics.   Also cause hallucinations.    Erythromycin Nausea And Vomiting   Iodinated Contrast Media Other (See Comments)    Renal issues   Lisinopril     Pt doesn't remember    Mycophenolate Mofetil Nausea Only   Sulfa Antibiotics Other (See Comments)    unknown    Social History:   Social History   Socioeconomic History   Marital status: Married    Spouse name: Not on file   Number  of children: 2   Years of education: Not on file   Highest education level: Not on file  Occupational History   Occupation: Retired  Tobacco Use   Smoking status: Never   Smokeless tobacco: Never  Vaping Use   Vaping status: Never Used  Substance and Sexual Activity   Alcohol use: No   Drug use: No   Sexual activity: Not Currently  Other Topics Concern   Not on file  Social History Narrative   Married '611 son- '65, 1 daughter '63; 6 children (2 adopted)SO- SOBRetirement- doing well. Marriage in good health. Patient has never smoked. Alcohol use- noPt gets regular exercise   Social Determinants of Health   Financial Resource Strain: Low Risk  (04/02/2022)   Overall Financial Resource Strain (CARDIA)    Difficulty of Paying Living Expenses: Not hard at all  Food Insecurity: No Food Insecurity (06/04/2023)   Hunger Vital Sign    Worried About Running Out of Food in the Last Year: Never true    Ran Out of Food in the Last Year: Never true  Transportation Needs: No Transportation Needs (06/04/2023)   PRAPARE - Administrator, Civil Service (Medical): No    Lack of Transportation (Non-Medical): No  Physical Activity: Inactive (04/02/2022)   Exercise Vital Sign    Days of Exercise per Week: 0 days    Minutes of Exercise per Session: 0 min  Stress: No Stress Concern Present (04/02/2022)   [employer] of Occupational Health - Occupational Stress Questionnaire    Feeling of Stress : Not at all  Social Connections: Socially Integrated (04/02/2022)   Social Connection and Isolation Panel [NHANES]    Frequency of  Communication with Friends and Family: More than three times a week    Frequency of Social Gatherings with Friends and Family: More than three times a week    Attends Religious Services: More than 4 times per year    Active Member of [employer] or Organizations: Yes    Attends Engineer, structural: More than 4 times per year    Marital Status: Married  Catering manager Violence: Not At Risk (06/04/2023)   Humiliation, Afraid, Rape, and Kick questionnaire    Fear of Current or Ex-Partner: No    Emotionally Abused: No    Physically Abused: No    Sexually Abused: No    Family History:    Family History  Problem Relation Age of Onset   Bladder Cancer Father    Diabetes Father    Prostate cancer Father    Alzheimer's disease Mother    Diabetes Sister    Lung cancer Sister         smoker   Esophageal cancer Paternal Uncle    Colon cancer Neg Hx      ROS:  Please see the history of present illness.   All other ROS reviewed and negative.     Physical Exam/Data:   Vitals:   06/05/23 0341 06/05/23 0711 06/05/23 0817 06/05/23 1431  BP: 136/66 129/64  (!) 121/44  Pulse: 88 88 85 87  Resp: 16 18    Temp: 97.7 F (36.5 C) 97.8 F (36.6 C)  97.8 F (36.6 C)  TempSrc: Oral Oral  Oral  SpO2: 98% 93% 93% 92%  Weight: 48.5 kg     Height:        Intake/Output Summary (Last 24 hours) at 06/05/2023 1631 Last data filed at 06/05/2023 1300 Gross per 24 hour  Intake 537  ml  Output 450 ml  Net 87 ml      06/05/2023    3:41 AM 06/04/2023    4:58 PM 06/03/2023   11:28 AM  Last 3 Weights  Weight (lbs) 106 lb 14.8 oz 101 lb 105 lb  Weight (kg) 48.5 kg 45.813 kg 47.628 kg     Body mass index is 18.94 kg/m.  General:  frail, no distress HEENT: normal Neck: no JVD Vascular: No carotid bruits; Distal pulses 2+ bilaterally Cardiac:  normal S1, S2; RRR; SEM RUSB Lungs:  nl wob, mild decreased BS Abd: soft, nontender, no hepatomegaly  Ext: no edema Musculoskeletal:  No  deformities, BUE and BLE strength normal and equal. 1+ BL edema in the ankles Skin: warm and dry  Neuro:  CNs 2-12 intact, no focal abnormalities noted Psych:  Normal affect   EKG:  The EKG was personally reviewed and demonstrates:   Normal sinus rhythm, low voltage, poor r wave progression  Telemetry:  Telemetry was personally reviewed and demonstrates:  sinus rhythm  Relevant CV Studies:  1. Left ventricular ejection fraction, by estimation, is 60 to 65%. The  left ventricle has normal function. The left ventricle has no regional  wall motion abnormalities. There is moderate left ventricular hypertrophy.  Left ventricular diastolic  parameters are consistent with Grade I diastolic dysfunction (impaired  relaxation).   2. Right ventricular systolic function is normal. The right ventricular  size is normal. There is moderately elevated pulmonary artery systolic  pressure.   3. Left atrial size was severely dilated.   4. Large pericardial effusion.   5. No evidence of mitral valve regurgitation. Mild to moderate mitral  stenosis. The mean mitral valve gradient is 7.0 mmHg. Severe mitral  annular calcification.   6. Tricuspid valve regurgitation is mild to moderate.   7. The aortic valve is calcified. Aortic valve regurgitation is moderate.    Laboratory Data:  High Sensitivity Troponin:   Recent Labs  Lab 06/04/23 1713 06/04/23 2125  TROPONINIHS 19* 22*     Chemistry Recent Labs  Lab 06/03/23 1200 06/04/23 1713 06/04/23 2353  NA 132* 133* 133*  K 4.3 4.7 4.4  CL 93* 96* 94*  CO2 31 24 30   GLUCOSE 89 92 107*  BUN 36* 34* 33*  CREATININE 1.16 1.26* 1.02*  CALCIUM 9.5 8.9 9.3  GFRNONAA  --  42* 54*  ANIONGAP  --  13 9    Recent Labs  Lab 06/03/23 1200  PROT 6.7  ALBUMIN 4.2  AST 19  ALT 8  ALKPHOS 62  BILITOT 0.6   Lipids No results for input(s): "CHOL", "TRIG", "HDL", "LABVLDL", "LDLCALC", "CHOLHDL" in the last 168 hours.  Hematology Recent Labs  Lab  06/03/23 1200 06/04/23 1713  WBC 4.4 6.1  RBC 2.86* 2.69*  HGB 10.0* 9.4*  HCT 30.7* 30.0*  MCV 107.1* 111.5*  MCH  --  34.9*  MCHC 32.6 31.3  RDW 12.9 12.4  PLT 173.0 172   Thyroid No results for input(s): "TSH", "FREET4" in the last 168 hours.  BNP Recent Labs  Lab 06/03/23 1200 06/04/23 2234 06/04/23 2353  BNP  --  1,976.3* 2,107.8*  PROBNP 2,680.0*  --   --     DDimer No results for input(s): "DDIMER" in the last 168 hours.   Radiology/Studies:  DG Chest 2 View  Result Date: 06/04/2023 CLINICAL DATA:  Shortness of breath, chest tightness EXAM: CHEST - 2 VIEW COMPARISON:  Chest radiograph dated 05/13/2023. CT chest dated  05/13/2022 FINDINGS: Stable cardiomegaly. Pericardial effusion is suspected, as noted on prior CT. Chronic additional markings, likely reflecting chronic interstitial lung disease when correlating with prior CT. No frank interstitial edema. No definite pleural effusions.  No pneumothorax. Surgical clips along the left chest wall/axilla. IMPRESSION: Stable cardiomegaly with suspected pericardial effusion. Electronically Signed   By: Charline Bills M.D.   On: 06/04/2023 18:51     Assessment and Plan:   Large Pericardial effusion -unchanged from prior study in March - no signs of tamponade - this is chronic dating back to 2003  DOE Chest pressure Ddx includes HFpEF, ILD also contributing - IVC challenging to see - BNP is up , very mild edema in her ankles; still reporting orthopnea - has mild demand ischemia - she has a small cavity and very thick heart with a pericardial effusion. Will need to tread lightly with her diuresis - will give another 1x IV lasix  Moderate AS to severe -asymptomatic -stable  HTN Good control today -home dilt 60 mg daily and spironolactone 12.5 mg daily have been held  Risk Assessment/Risk Scores:      For questions or updates, please contact [facility] Please consult [email or web address] for contact info  under    Signed, Maisie Fus, MD  06/05/2023 4:31 PM

## 2023-06-06 ENCOUNTER — Other Ambulatory Visit: Payer: Self-pay | Admitting: Physician Assistant

## 2023-06-06 DIAGNOSIS — I5032 Chronic diastolic (congestive) heart failure: Secondary | ICD-10-CM

## 2023-06-06 DIAGNOSIS — I3139 Other pericardial effusion (noninflammatory): Secondary | ICD-10-CM

## 2023-06-06 DIAGNOSIS — I272 Pulmonary hypertension, unspecified: Secondary | ICD-10-CM | POA: Diagnosis not present

## 2023-06-06 DIAGNOSIS — I5031 Acute diastolic (congestive) heart failure: Secondary | ICD-10-CM

## 2023-06-06 DIAGNOSIS — I5033 Acute on chronic diastolic (congestive) heart failure: Secondary | ICD-10-CM | POA: Diagnosis not present

## 2023-06-06 LAB — RENAL FUNCTION PANEL
Albumin: 3.1 g/dL — ABNORMAL LOW (ref 3.5–5.0)
Anion gap: 11 (ref 5–15)
BUN: 33 mg/dL — ABNORMAL HIGH (ref 8–23)
CO2: 30 mmol/L (ref 22–32)
Calcium: 8.6 mg/dL — ABNORMAL LOW (ref 8.9–10.3)
Chloride: 91 mmol/L — ABNORMAL LOW (ref 98–111)
Creatinine, Ser: 1.07 mg/dL — ABNORMAL HIGH (ref 0.44–1.00)
GFR, Estimated: 51 mL/min — ABNORMAL LOW (ref >60)
Glucose, Bld: 91 mg/dL (ref 70–99)
Phosphorus: 4 mg/dL (ref 2.5–4.6)
Potassium: 3.8 mmol/L (ref 3.5–5.1)
Sodium: 132 mmol/L — ABNORMAL LOW (ref 135–145)

## 2023-06-06 LAB — MAGNESIUM: Magnesium: 1.7 mg/dL (ref 1.7–2.4)

## 2023-06-06 MED ORDER — SPIRONOLACTONE 12.5 MG HALF TABLET
12.5000 mg | ORAL_TABLET | Freq: Every day | ORAL | Status: DC
  Administered 2023-06-06: 12.5 mg via ORAL
  Filled 2023-06-06: qty 1

## 2023-06-06 MED ORDER — FUROSEMIDE 20 MG PO TABS: 40.0000 mg | ORAL_TABLET | Freq: Every day | ORAL | 0 refills | Status: DC

## 2023-06-06 MED ORDER — FUROSEMIDE 40 MG PO TABS
40.0000 mg | ORAL_TABLET | Freq: Every day | ORAL | Status: DC
  Administered 2023-06-06: 40 mg via ORAL
  Filled 2023-06-06: qty 1

## 2023-06-06 NOTE — Plan of Care (Signed)

## 2023-06-06 NOTE — Progress Notes (Signed)
Occupational Therapy Treatment Patient Details Name: Paula Ward MRN: [id number] DOB: 1936-03-24 Today's Date: 06/06/2023   History of present illness 87 y.o. female presents to [facility] hospital on 06/04/2023 with SOB, chest tightness, orthopnea and LE edema. Pt admitted for management of fluid overload. PMH includes scleroderma, crest syndrome, ILD, PAH, pericardial effusion, CKDIII, AVM of small bowel, chronic respiratory failure, breast cancer, renal cell carcinoma, GERD.   OT comments  Patient supine in bed upon arrival into room and agreeable to participate in OT intervention.  Patient completed supine to sit mod I with HOB elevated, sit to stand CGA and functional mobility with cane completed to bathroom with CGA.  Patient completed clothing mgmt and hygiene with Supervision.  Patient completed standing hand hygiene and combing hair at sink with CGA.  Patient transferred to bedside chair with CGA and 02 sats reading 86%, 2 liters applied and patient rebounding to 93% on 2 liters with PLB technique cueing.  Patinet left in bedside chair with all needs within reach.  Patient could benefit from additional OT intervention to address functional deficits in order for patient to return to PLOF.      If plan is discharge home, recommend the following:  A little help with walking and/or transfers;Assistance with cooking/housework;Help with stairs or ramp for entrance;A little help with bathing/dressing/bathroom   Equipment Recommendations  None recommended by OT    Recommendations for Other Services      Precautions / Restrictions Precautions Precautions: Fall Restrictions Weight Bearing Restrictions: No       Mobility Bed Mobility Overal bed mobility: Modified Independent             General bed mobility comments:  (HOB elevated)    Transfers Overall transfer level: Needs assistance Equipment used:  (cane) Transfers: Sit to/from Stand, Bed to chair/wheelchair/BSC Sit to Stand:  Contact guard assist     Step pivot transfers: Contact guard assist           Balance Overall balance assessment: Needs assistance Sitting-balance support: No upper extremity supported, Feet supported Sitting balance-Leahy Scale: Good                                     ADL either performed or assessed with clinical judgement   ADL Overall ADL's : Needs assistance/impaired Eating/Feeding: Independent;Sitting   Grooming: Wash/dry hands;Wash/dry face;Standing;Contact guard assist                   Toilet Transfer: Contact guard assist (cane)   Toileting- Clothing Manipulation and Hygiene: Contact guard assist       Functional mobility during ADLs: Contact guard assist;Cane General ADL Comments:  (02 sats dropping to 86-87% RA, 2 liters placed and PLB technique initiated and 02 increasing to 93%)    Extremity/Trunk Assessment Upper Extremity Assessment Upper Extremity Assessment: Overall WFL for tasks assessed            Vision   Vision Assessment?: No apparent visual deficits   Perception Perception Perception: Within Functional Limits   Praxis      Cognition Arousal: Alert Behavior During Therapy: WFL for tasks assessed/performed Overall Cognitive Status: Within Functional Limits for tasks assessed  Exercises      Shoulder Instructions       General Comments      Pertinent Vitals/ Pain       Pain Assessment Pain Assessment: No/denies pain  Home Living                                          Prior Functioning/Environment              Frequency  Min 1X/week        Progress Toward Goals  OT Goals(current goals can now be found in the care plan section)  Progress towards OT goals: Progressing toward goals  Acute Rehab OT Goals OT Goal Formulation: With patient Time For Goal Achievement: 06/19/23 Potential to Achieve Goals: Good ADL  Goals Pt Will Perform Grooming: with modified independence;standing Pt Will Perform Lower Body Bathing: with modified independence;sit to/from stand Pt Will Perform Lower Body Dressing: with modified independence;sit to/from stand Pt Will Transfer to Toilet: with modified independence;ambulating;regular height toilet Pt Will Perform Toileting - Clothing Manipulation and hygiene: with modified independence;sit to/from stand Pt Will Perform Tub/Shower Transfer: Shower transfer;shower seat;grab bars;with supervision Additional ADL Goal #1: Pt will independently verbalize at least 3 energy conservation strategies for selfcare tasks.  Plan      Co-evaluation                 AM-PAC OT "6 Clicks" Daily Activity     Outcome Measure   Help from another person eating meals?: None Help from another person taking care of personal grooming?: A Little Help from another person toileting, which includes using toliet, bedpan, or urinal?: A Little Help from another person bathing (including washing, rinsing, drying)?: A Little Help from another person to put on and taking off regular upper body clothing?: A Little Help from another person to put on and taking off regular lower body clothing?: A Little 6 Click Score: 19    End of Session Equipment Utilized During Treatment: Oxygen (cane)  OT Visit Diagnosis: Unsteadiness on feet (R26.81)   Activity Tolerance Patient tolerated treatment well   Patient Left in chair;with call bell/phone within reach   Nurse Communication Mobility status;Other (comment)        Time: 7829-5621 OT Time Calculation (min): 15 min  Charges: OT General Charges $OT Visit: 1 Visit OT Treatments $Self Care/Home Management : 8-22 mins  Governor Specking OT/L  Denice Paradise 06/06/2023, 9:05 AM

## 2023-06-06 NOTE — Plan of Care (Signed)

## 2023-06-06 NOTE — Progress Notes (Signed)
Approximately 1430--Pt discharged to home. At time of discharge, pt A&O x 4 and VSS. No complaints of pain at this time. AVS discharge instructions provided to pt and family at bedside. All questions answered at this time. All PIVS removed and sites WDL. Pt discharged with all belongings. Telemetry disconnected and notified.

## 2023-06-06 NOTE — Progress Notes (Signed)
Rounding Note    Patient Name: Paula Ward Date of Encounter: 06/06/2023  [facility] Cardiologist: Dr Jens Som  Subjective   No CP or dyspnea  Inpatient Medications    Scheduled Meds:  docusate sodium  100 mg Oral BID   enoxaparin (LOVENOX) injection  30 mg Subcutaneous Q24H   famotidine  20 mg Oral QHS   ipratropium  2 spray Each Nare Q12H   latanoprost  1 drop Both Eyes QHS   macitentan  10 mg Oral Daily   mirtazapine  15 mg Oral QHS   montelukast  10 mg Oral QHS   pantoprazole  40 mg Oral Daily   Continuous Infusions:  PRN Meds: acetaminophen **OR** acetaminophen   Vital Signs    Vitals:   06/05/23 1959 06/05/23 2319 06/06/23 0416 06/06/23 0838  BP: (!) 133/56 (!) 126/55 133/71 (!) 108/44  Pulse: 87 92 93 89  Resp: 18 16 18 18   Temp: 99 F (37.2 C) 98.8 F (37.1 C) (!) 97.5 F (36.4 C) 97.6 F (36.4 C)  TempSrc: Oral Oral Oral Oral  SpO2: 95% 98% 96% 96%  Weight:   45.3 kg   Height:        Intake/Output Summary (Last 24 hours) at 06/06/2023 1014 Last data filed at 06/06/2023 0839 Gross per 24 hour  Intake 1057 ml  Output 1750 ml  Net -693 ml      06/06/2023    4:16 AM 06/05/2023    3:41 AM 06/04/2023    4:58 PM  Last 3 Weights  Weight (lbs) 99 lb 13.9 oz 106 lb 14.8 oz 101 lb  Weight (kg) 45.3 kg 48.5 kg 45.813 kg      Telemetry    Sinus - Personally Reviewed   Physical Exam   GEN: No acute distress. Frail   Neck: No JVD Cardiac: RRR, 2/6 systolic murmur Respiratory: Clear to auscultation bilaterally. GI: Soft, nontender, non-distended  MS: No edema Neuro:  Nonfocal  Psych: Normal affect   Labs    High Sensitivity Troponin:   Recent Labs  Lab 06/04/23 1713 06/04/23 2125  TROPONINIHS 19* 22*     Chemistry Recent Labs  Lab 06/03/23 1200 06/04/23 1713 06/04/23 2353 06/06/23 0230  NA 132* 133* 133* 132*  K 4.3 4.7 4.4 3.8  CL 93* 96* 94* 91*  CO2 31 24 30 30   GLUCOSE 89 92 107* 91  BUN 36* 34* 33* 33*   CREATININE 1.16 1.26* 1.02* 1.07*  CALCIUM 9.5 8.9 9.3 8.6*  MG  --   --   --  1.7  PROT 6.7  --   --   --   ALBUMIN 4.2  --   --  3.1*  AST 19  --   --   --   ALT 8  --   --   --   ALKPHOS 62  --   --   --   BILITOT 0.6  --   --   --   GFRNONAA  --  42* 54* 51*  ANIONGAP  --  13 9 11      Hematology Recent Labs  Lab 06/03/23 1200 06/04/23 1713  WBC 4.4 6.1  RBC 2.86* 2.69*  HGB 10.0* 9.4*  HCT 30.7* 30.0*  MCV 107.1* 111.5*  MCH  --  34.9*  MCHC 32.6 31.3  RDW 12.9 12.4  PLT 173.0 172    BNP Recent Labs  Lab 06/03/23 1200 06/04/23 2234 06/04/23 2353  BNP  --  4,098.1* 2,107.8*  PROBNP 2,680.0*  --   --       Radiology    ECHOCARDIOGRAM COMPLETE  Result Date: 06/05/2023    ECHOCARDIOGRAM REPORT   Patient Name:   Paula Ward Date of Exam: 06/05/2023 Medical Rec #:  [id number]       Height:       63.0 in Accession #:    [id number]      Weight:       106.9 lb Date of Birth:  December 20, 1935       BSA:          1.482 m Patient Age:    87 years        BP:           136/66 mmHg Patient Gender: F               HR:           88 bpm. Exam Location:  Inpatient Procedure: 2D Echo and Color Doppler Indications:    CHF- Acute Diastolic  History:        Patient has prior history of Echocardiogram examinations, most                 recent 01/15/2023. Pericardial Effusion, Pulmonary HTN; Risk                 Factors:CKD. Pulmonary Emblous.  Sonographer:    Raeford Razor RDCS Referring Phys: [id number] VASUNDHRA RATHORE IMPRESSIONS  1. Left ventricular ejection fraction, by estimation, is 70 to 75%. The left ventricle has hyperdynamic function. The left ventricle has no regional wall motion abnormalities. There is moderate concentric left ventricular hypertrophy. Indeterminate diastolic filling due to E-A fusion.  2. Right ventricular systolic function is mildly reduced. The right ventricular size is normal.  3. Left atrial size was severely dilated.  4. Large pericardial effusion surrounds heart,  mainly posteriorly and lateral to LV. [place]un[place] dimension is 5.6 cm posteriorly. It has increased in size from echo done in June 2024. Does not meet criteria for tamponade by echo criteria. Clinical correlation indicated .Marland Kitchen Large pericardial effusion.  5. MV annulus is severely calcified. MV is thickened with restricted motion. Difficult to see well Mean gradient through the MV is 8 mm Hg. MVA (Pt1/2 ) 3 cm2. Compared to echo report from June 2024, mean gradient is incresaed (5 to 8 mm Hg) Consider TEE to evaluate further.. Trivial mitral valve regurgitation.  6. AV is thickened, calcified with restricted motion. Peak and mean gradients through the valve are 59 and 37 mm Hg respectively. Dimensionless index is 0.33 consistent with moderate AS.Marland Kitchen The aortic valve is abnormal. Aortic valve regurgitation is not visualized.  7. The inferior vena cava is normal in size with <50% respiratory variability, suggesting right atrial pressure of 8 mmHg. FINDINGS  Left Ventricle: Left ventricular ejection fraction, by estimation, is 70 to 75%. The left ventricle has hyperdynamic function. The left ventricle has no regional wall motion abnormalities. The left ventricular internal cavity size was small. There is moderate concentric left ventricular hypertrophy. Indeterminate diastolic filling due to E-A fusion. Right Ventricle: The right ventricular size is normal. Right vetricular wall thickness was not assessed. Right ventricular systolic function is mildly reduced. Left Atrium: Left atrial size was severely dilated. Right Atrium: Right atrial size was normal in size. Pericardium: Large pericardial effusion surrounds heart, mainly posteriorly and lateral to LV. Naxunak dimension is 5.6 cm posteriorly. It has increased in size from echo done  in June 2024. Does not meet criteria for tamponade by echo criteria. Clinical correlation indicated. A large pericardial effusion is present. Mitral Valve: MV annulus is severely calcified. MV  is thickened with restricted motion. Difficult to see well Mean gradient through the MV is 8 mm Hg. MVA (Pt1/2 ) 3 cm2. Compared to echo report from June 2024, mean gradient is incresaed (5 to 8 mm Hg) Consider TEE to evaluate further. Trivial mitral valve regurgitation. MV peak gradient, 13.2 mmHg. The mean mitral valve gradient is 8.0 mmHg. Tricuspid Valve: The tricuspid valve is normal in structure. Tricuspid valve regurgitation is mild. Aortic Valve: AV is thickened, calcified with restricted motion. Peak and mean gradients through the valve are 59 and 37 mm Hg respectively. Dimensionless index is 0.33 consistent with moderate AS. The aortic valve is abnormal. Aortic valve regurgitation  is not visualized. Aortic valve mean gradient measures 37.0 mmHg. Aortic valve peak gradient measures 58.7 mmHg. Aortic valve area, by VTI measures 0.95 cm. Pulmonic Valve: The pulmonic valve was not well visualized. Pulmonic valve regurgitation is mild. Aorta: The aortic root and ascending aorta are structurally normal, with no evidence of dilitation. Venous: The inferior vena cava is normal in size with less than 50% respiratory variability, suggesting right atrial pressure of 8 mmHg. IAS/Shunts: No atrial level shunt detected by color flow Doppler.  LEFT VENTRICLE PLAX 2D LVIDd:         3.50 cm   Diastology LVIDs:         2.00 cm   LV e' medial:    8.21 cm/s LV PW:         1.30 cm   LV E/e' medial:  13.4 LV IVS:        1.40 cm   LV e' lateral:   6.74 cm/s LVOT diam:     1.90 cm   LV E/e' lateral: 16.3 LV SV:         70 LV SV Index:   47 LVOT Area:     2.84 cm  RIGHT VENTRICLE RV Basal diam:  1.90 cm RV S prime:     18.60 cm/s TAPSE (M-mode): 2.2 cm LEFT ATRIUM              Index        RIGHT ATRIUM           Index LA diam:        3.80 cm  2.56 cm/m   RA Area:     14.90 cm LA Vol (A2C):   107.0 ml 72.21 ml/m  RA Volume:   36.20 ml  24.43 ml/m LA Vol (A4C):   75.0 ml  50.61 ml/m LA Biplane Vol: 92.1 ml  62.15 ml/m   AORTIC VALVE AV Area (Vmax):    1.07 cm AV Area (Vmean):   0.94 cm AV Area (VTI):     0.95 cm AV Vmax:           383.00 cm/s AV Vmean:          290.000 cm/s AV VTI:            0.740 m AV Peak Grad:      58.7 mmHg AV Mean Grad:      37.0 mmHg LVOT Vmax:         144.00 cm/s LVOT Vmean:        96.500 cm/s LVOT VTI:          0.247 m LVOT/AV VTI ratio: 0.33  AORTA  Ao Asc diam: 3.20 cm MITRAL VALVE                TRICUSPID VALVE MV Area (PHT): 1.59 cm     TR Peak grad:   58.7 mmHg MV Peak grad:  13.2 mmHg    TR Vmax:        383.00 cm/s MV Mean grad:  8.0 mmHg MV Vmax:       1.82 m/s     SHUNTS MV Vmean:      139.0 cm/s   Systemic VTI:  0.25 m MV PHT:        71.45 msec   Systemic Diam: 1.90 cm MV Decel Time: 182 msec MV E velocity: 110.00 cm/s MV A velocity: 147.00 cm/s MV E/A ratio:  0.75 Dietrich Pates MD Electronically signed by Dietrich Pates MD Signature Date/Time: 06/05/2023/6:21:35 PM    Final    DG Chest 2 View  Result Date: 06/04/2023 CLINICAL DATA:  Shortness of breath, chest tightness EXAM: CHEST - 2 VIEW COMPARISON:  Chest radiograph dated 05/13/2023. CT chest dated 05/13/2022 FINDINGS: Stable cardiomegaly. Pericardial effusion is suspected, as noted on prior CT. Chronic additional markings, likely reflecting chronic interstitial lung disease when correlating with prior CT. No frank interstitial edema. No definite pleural effusions.  No pneumothorax. Surgical clips along the left chest wall/axilla. IMPRESSION: Stable cardiomegaly with suspected pericardial effusion. Electronically Signed   By: Charline Bills M.D.   On: 06/04/2023 18:51      Patient Profile     87 y.o. female with past medical history of chronic pericardial effusion (dates back to at least 2003), pulmonary hypertension (previous pulmonary embolus, pulmonary fibrosis -did not tolerate CellCept or azathioprine- and crest syndrome); previously followed at [facility] and did not want to try Flolan, maintained on macitentan and cardizem,  GI bleed secondary to AV malformations, aortic stenosis for evaluation of acute on chronic diastolic congestive heart failure.  Cardiac catheterization July 2023 showed no obstructive coronary disease, mild pulmonary hypertension, moderate aortic stenosis with mean gradient 16 mmHg and aortic valve area 1.2 cm.  Echocardiogram this admission shows hyperdynamic LV function, mild RV dysfunction, severe left atrial enlargement, large pericardial effusion, no tamponade, mild mitral stenosis, moderate aortic stenosis with mean gradient 37 mmHg and dimensionless index 0.33.  Assessment & Plan    1 acute on chronic diastolic congestive heart failure-patient is euvolemic today.  Would resume Lasix 40 mg daily and spironolactone 12.5 mg daily.  Check potassium and renal function in 1 week.  2 large pericardial effusion-this has been present for 20 years.  No evidence of tamponade on recent echocardiogram.  Will follow for now.  3 aortic stenosis-moderate to severe on recent echocardiogram.  She will need follow-up echocardiogram in 6 months.  Will likely need consideration of TAVR in the future if she is felt to be a candidate.  She has multiple comorbidities and is somewhat frail.  4 pulmonary hypertension-continue Opsumit.  Her blood pressure is low at home and her Cardizem was therefore discontinued previously.  5 history of hypertension-patient's blood pressure is running low as outlined.  Can be discharged from a cardiac standpoint.  Will arrange bmet one week and follow-up APP 2 weeks.  For questions or updates, please contact [facility] Please consult [email or web address] for contact info under        Signed, Olga Millers, MD  06/06/2023, 10:14 AM

## 2023-06-08 ENCOUNTER — Encounter: Payer: Self-pay | Admitting: *Deleted

## 2023-06-08 ENCOUNTER — Ambulatory Visit: Payer: Medicare PPO | Admitting: Cardiology

## 2023-06-08 ENCOUNTER — Telehealth: Payer: Self-pay | Admitting: *Deleted

## 2023-06-08 NOTE — Transitions of Care (Post Inpatient/ED Visit) (Signed)
   06/08/2023  Name: LIVINGSTON SENTERS MRN: [id number] DOB: 03-07-1936  Today's TOC FU Call Status: Today's TOC FU Call Status:: Unsuccessful Call (1st Attempt) Unsuccessful Call (1st Attempt) Date: 06/08/23  Attempted to reach the patient regarding the most recent Inpatient visit; patient answered phone but reported unable to talk: she is currently at [place] office with her husband- requests call back tomorrow  Follow Up Plan: Additional outreach attempts will be made to reach the patient to complete the Transitions of Care (Post Inpatient visit) call.   Caryl Pina, RN, BSN, CCRN Alumnus RN CM Care Coordination/ Transition of Care- [facility] Care Management [phone]: direct office

## 2023-06-09 ENCOUNTER — Telehealth: Payer: Self-pay | Admitting: *Deleted

## 2023-06-09 ENCOUNTER — Encounter: Payer: Self-pay | Admitting: *Deleted

## 2023-06-09 NOTE — Transitions of Care (Post Inpatient/ED Visit) (Signed)
06/09/2023  Name: Paula Ward MRN: [id number] DOB: 08-15-1936  Today's TOC FU Call Status: Today's TOC FU Call Status:: Successful TOC FU Call Completed TOC FU Call Complete Date: 06/09/23  Transition Care Management Follow-up Telephone Call Date of Discharge: 06/06/23 Discharge Facility: [facility]) Type of Discharge: Inpatient Admission Primary Inpatient Discharge Diagnosis:: vaolume overload; CHF; SOB; CKD How have you been since you were released from the hospital?: Better ("I can't believe how much better I am feeling since I got home-- I feel like my old self, from years back.  I am doing fine") Any questions or concerns?: No  Items Reviewed: Did you receive and understand the discharge instructions provided?: Yes (thoroughly reviewed with patient who verbalizes good understanding of same) Medications obtained,verified, and reconciled?: Partial Review Completed (confirmed self-manages medications and denies questions/ concerns around medications today) Reason for Partial Mediation Review: patient reports time constraints: she is getting ready to leave her home to go with her husband to a doctor's appointment Any new allergies since your discharge?: No Dietary orders reviewed?: Yes Type of Diet Ordered:: "Healthy" Do you have support at home?: Yes People in Home: spouse Name of Support/Comfort Primary Source: Reports independent in self-care activities; supportive spouse and local extended family assists as/ if needed/ indicated  Medications Reviewed Today: Medications Reviewed Today     Reviewed by Michaela Corner, RN (Registered Nurse) on 06/09/23 at 1105  Med List Status: <None>   Medication Order Taking? Sig Documenting Provider Last Dose Status Informant  acetaminophen (TYLENOL) 325 MG tablet [id number]  Take 2 tablets (650 mg total) by mouth every 6 (six) hours as needed for moderate pain. Wallis Bamberg, PA-C  Active Self, Pharmacy Records  albuterol (VENTOLIN HFA)  108 (90 Base) MCG/ACT inhaler [id number]  INHALE 2 PUFFS INTO THE LUNGS EVERY 6 HOURS AS NEEDED FOR WHEEZING OR SHORTNESS OF BREATH [name], MD  Active Self, Pharmacy Records  clobetasol ointment (TEMOVATE) 0.05 % [id number]  Apply 1 Application topically 2 (two) times daily. [provider]  Active Self, Pharmacy Records           Med Note Deloria Lair, DUROJAHYE' R   Thu Jun 04, 2023  9:56 PM) Pt is unsure if she is still using this medication  Cyanocobalamin (VITAMIN B-12) 1000 MCG SUBL [id number]  Place 1,000 mcg under the tongue. Taking twice a week [provider]  Active Self, Pharmacy Records  docusate sodium (COLACE) 100 MG capsule [id number]  Take 1 capsule (100 mg total) by mouth 2 (two) times daily.  Patient taking differently: Take 100 mg by mouth 2 (two) times daily. Pt takes every morning   Edmisten, Lyn Hollingshead, PA  Active Self, Pharmacy Records  famotidine (PEPCID) 20 MG tablet [id number]  Take 1 tablet (20 mg total) by mouth at bedtime. Napoleon Form, MD  Active Self, Pharmacy Records           Med Note Deloria Lair, DUROJAHYE' R   Thu Jun 04, 2023  9:57 PM) Pt states that it is supposed to be taken every night but sometimes she forgets to take it.  fluticasone (FLONASE) 50 MCG/ACT nasal spray [id number]  Place 1 spray into both nostrils daily as needed for allergies.  Patient taking differently: Place 1 spray into both nostrils in the morning and at bedtime.   [name], MD  Active Self, Pharmacy Records  furosemide (LASIX) 20 MG tablet [id number] Yes Take 2 tablets (40 mg total) by mouth daily. Allena Katz,  Denzil Magnuson, MD Taking Active            Med Note Michaela Corner   Tue Jun 09, 2023 11:05 AM) 06/09/23: Reports during [facility] call is taking as prescribed; confirmed she has obtained/ is taking newly increased dose according to hospital discharge instructions on 06/06/23  guaiFENesin (MUCINEX) 600 MG 12 hr tablet [id number]  Take 300 mg by mouth daily.  [provider]  Active Self, Pharmacy Records  ipratropium (ATROVENT) 0.03 % nasal spray [id number]  Place 2 sprays into both nostrils every 12 (twelve) hours. Martina Sinner, MD  Active Self, Pharmacy Records  latanoprost (XALATAN) 0.005 % ophthalmic solution [id number]  Place 1 drop into both eyes at bedtime. [provider]  Active Self, Pharmacy Records  magic mouthwash SOLN [id number]  Take 10 mLs by mouth 4 (four) times daily as needed for mouth pain. Burnadette Pop, MD  Active Self, Pharmacy Records  mirtazapine (REMERON) 15 MG tablet [id number]  Take 1 tablet (15 mg total) by mouth at bedtime. [name], MD  Active Self, Pharmacy Records  montelukast [employer]) 10 MG tablet [id number]  TAKE 1 TABLET BY MOUTH AT BEDTIME [name], MD  Active Self, Pharmacy Records  Multiple Vitamin (MULTIVITAMIN) capsule [id number]  Take 1 capsule by mouth daily.  Patient not taking: Reported on 06/04/2023   [provider]  Active Self, Pharmacy Records  mupirocin ointment (BACTROBAN) 2 % [id number]  Apply 1 Application topically 2 (two) times daily as needed (dry skin).  Patient not taking: Reported on 06/04/2023   [provider]  Active Self, Pharmacy Records  OPSUMIT 10 MG TABS [id number]  Take 10 mg by mouth daily. [provider]  Active Self, Pharmacy Records           Med Note [facility] Pierce Crane Dec 24, 2015 10:20 PM)    OXYGEN [id number]  Inhale into the lungs. 2 LMP at night and upon exertion [provider]  Active Self, Pharmacy Records  pantoprazole (PROTONIX) 40 MG tablet [id number]  Take 1 tablet (40 mg total) by mouth daily. Napoleon Form, MD  Active Self, Pharmacy Records  polyethylene glycol (MIRALAX / GLYCOLAX) 17 g packet [id number]  Take 17 g by mouth daily.  Patient not taking: Reported on 06/04/2023   Derenda Fennel, PA  Active Self, Pharmacy Records  spironolactone (ALDACTONE) 25 MG tablet  [id number]  Take 0.5 tablets (12.5 mg total) by mouth daily. [name], MD  Active Self, Pharmacy Records  sucralfate (CARAFATE) 1 GM/10ML suspension [id number]  TAKE 10 MLS BY MOUTH EVERY 6 HOURS AS NEEDED FOR REFLUX Nandigam, Eleonore Chiquito, MD  Active Self, Pharmacy Records           Home Care and Equipment/Supplies: Were Home Health Services Ordered?: No Any new equipment or medical supplies ordered?: No  Functional Questionnaire: Do you need assistance with bathing/showering or dressing?: No Do you need assistance with meal preparation?: No Do you need assistance with eating?: No Do you have difficulty maintaining continence: No Do you need assistance with getting out of bed/getting out of a chair/moving?: No Do you have difficulty managing or taking your medications?: No  Follow up appointments reviewed: PCP Follow-up appointment confirmed?: Yes Date of PCP follow-up appointment?: 06/19/23 Follow-up Provider: PCP Specialist Hospital Follow-up appointment confirmed?: Yes Date of Specialist follow-up appointment?: 06/19/23 Follow-Up Specialty Provider:: cardiology provider Do you need transportation to your follow-up appointment?: No Do you understand care  options if your condition(s) worsen?: Yes-patient verbalized understanding  SDOH Interventions Today    Flowsheet Row Most Recent Value  SDOH Interventions   Food Insecurity Interventions Intervention Not Indicated  Transportation Interventions Intervention Not Indicated  [normally drives self,  friends and family currently assisiting post-recent hospital discharge]      TOC Interventions Today    Flowsheet Row Most Recent Value  TOC Interventions   TOC Interventions Discussed/Reviewed TOC Interventions Discussed  [Patient declines need for ongoing/ further care coordination outreach,  no care coordination needs identified at time of TOC call today]      Interventions Today    Flowsheet Row Most Recent  Value  Chronic Disease   Chronic disease during today's visit Congestive Heart Failure (CHF)  General Interventions   General Interventions Discussed/Reviewed General Interventions Discussed, Durable Medical Equipment (DME), Doctor Visits  Doctor Visits Discussed/Reviewed Doctor Visits Discussed, PCP, Specialist  Durable Medical Equipment (DME) Oxygen, Other  [confirmed currently requiring/ using assistive devices - cane,  confirmed continues using home O2 qHS]  PCP/Specialist Visits Compliance with follow-up visit  Education Interventions   Education Provided Provided Education  Provided Verbal Education On Other  [need to discuss timing of resumption of driving self, with care providers at upcoming scheduled offfice visits]  Nutrition Interventions   Nutrition Discussed/Reviewed Nutrition Discussed  Pharmacy Interventions   Pharmacy Dicussed/Reviewed Pharmacy Topics Discussed  Safety Interventions   Safety Discussed/Reviewed Safety Discussed, Fall Risk      Caryl Pina, RN, BSN, CCRN Alumnus RN CM Care Coordination/ Transition of Care- [facility] Care Management [phone]: direct office

## 2023-06-10 NOTE — Discharge Summary (Signed)
Physician Discharge Summary   Patient: Paula Ward MRN: [id number] DOB: 08-09-1936  Admit date:     06/04/2023  Discharge date: 06/06/2023  Discharge Physician: Charolotte Eke   PCP: [name], MD   Recommendations at discharge:    Renal function and K check in 1 week, follow up with cardiology/PCP about this  Discharge Diagnoses: Principal Problem:   Volume overload Active Problems:   Pulmonary hypertension (HCC)   CKD (chronic kidney disease) stage 3, GFR 30-59 ml/min (HCC)   GASTROESOPHAGEAL REFLUX DISEASE   Acute CHF (HCC)   Macrocytic anemia  Resolved Problems:   * No resolved hospital problems. *  Hospital Course: Patient is an 87 year old female with past medical history significant for scleroderma, crest syndrome, ILD, pulmonary hypertension, pericardial effusion, chronic kidney disease stage IIIb, AVMs of the small bowel, chronic respiratory failure on supplemental oxygen at nighttime, history of breast cancer and renal cancer.  Patient also carries diagnosis of mitral valve stenosis, moderate aortic regurgitation, mild to moderate tricuspid regurgitation, ERD, PE, B12 deficiency and chronic microcytic anemia.  Patient was admitted with worsening shortness of breath, orthopnea, chest tightness and bilateral lower extremity edema.  Rising cardiac BNP was reported (from 1750-2680, despite increased diuretics).  Patient has been admitted for further evaluation and management of HFpEF exacerbation, her pericardial effusion was stable.   Assessment and Plan: Volume overload Acute HFpEF exacerbation History of pulmonary hypertension, pericardial effusion Patient presented with worsening dyspnea, orthopnea, and bilateral lower extremity edema despite doubling the dose of her home diuretics recently. BNP was 1750 on labs done 05/13/2023 and 2680 on repeat on admission.Chest x-ray showing stable cardiomegaly with suspected pericardial effusion; no pulmonary edema or  pleural effusions.  Pericardial effusion appears to be chronic, no signs of tamponade. Last echo done in March 2024 showing EF 60 to 65%, grade 1 diastolic dysfunction, moderately elevated pulmonary artery systolic pressure, large pericardial effusion, mild to moderate mitral stenosis, mild to moderate tricuspid valve regurgitation, and moderate aortic valve regurgitation. Similar echo this admission though effusion size has increased without evidence of tamponade. Patient was treated with IV diuretics and transitioned to oral with Cardiology management.  -continue lasix 40 mg once daily, spiro 12.5 mg once dialy   Pulmonary hypertension Continue macitentan.     Pericardial effusion Appears to be chronic, no signs of tamponade.  Hx of Mitral stenosis, mild Hx of Moderate AS -follow up with cardiology  Chronic macrocytic anemia History of B12 deficiency Hemoglobin close to baseline.   Mild hyponatremia -Mild and chronic.     CKD stage IIIa/b -recheck BMP in 1 week   GERD Continue PPI and H2 blocker.      Consultants: cardiology Procedures performed: none Disposition: Home Diet recommendation:  Discharge Diet Orders (From admission, onward)     Start     Ordered   06/06/23 0000  Diet - low sodium heart healthy        06/06/23 1335           Regular diet DISCHARGE MEDICATION: Allergies as of 06/06/2023       Reactions   Codeine Nausea Only   Hallucinations, too   Other Nausea And Vomiting   "-mycin" antibiotics.   Also cause hallucinations.   Erythromycin Nausea And Vomiting   Iodinated Contrast Media Other (See Comments)   Renal issues   Lisinopril    Pt doesn't remember    Mycophenolate Mofetil Nausea Only   Sulfa Antibiotics Other (See Comments)  unknown        Medication List     STOP taking these medications    citalopram 10 MG tablet Commonly known as: CELEXA   diltiazem 120 MG 24 hr capsule Commonly known as: CARDIZEM CD   diltiazem 60  MG 12 hr capsule Commonly known as: CARDIZEM SR       TAKE these medications    acetaminophen 325 MG tablet Commonly known as: Tylenol Take 2 tablets (650 mg total) by mouth every 6 (six) hours as needed for moderate pain.   albuterol 108 (90 Base) MCG/ACT inhaler Commonly known as: VENTOLIN HFA INHALE 2 PUFFS INTO THE LUNGS EVERY 6 HOURS AS NEEDED FOR WHEEZING OR SHORTNESS OF BREATH   clobetasol ointment 0.05 % Commonly known as: TEMOVATE Apply 1 Application topically 2 (two) times daily.   docusate sodium 100 MG capsule Commonly known as: COLACE Take 1 capsule (100 mg total) by mouth 2 (two) times daily. What changed: additional instructions   famotidine 20 MG tablet Commonly known as: PEPCID Take 1 tablet (20 mg total) by mouth at bedtime.   fluticasone 50 MCG/ACT nasal spray Commonly known as: FLONASE Place 1 spray into both nostrils daily as needed for allergies. What changed: when to take this   furosemide 20 MG tablet Commonly known as: LASIX Take 2 tablets (40 mg total) by mouth daily. What changed: how much to take   guaiFENesin 600 MG 12 hr tablet Commonly known as: MUCINEX Take 300 mg by mouth daily.   ipratropium 0.03 % nasal spray Commonly known as: ATROVENT Place 2 sprays into both nostrils every 12 (twelve) hours.   latanoprost 0.005 % ophthalmic solution Commonly known as: XALATAN Place 1 drop into both eyes at bedtime.   magic mouthwash Soln Take 10 mLs by mouth 4 (four) times daily as needed for mouth pain.   mirtazapine 15 MG tablet Commonly known as: REMERON Take 1 tablet (15 mg total) by mouth at bedtime.   montelukast 10 MG tablet Commonly known as: SINGULAIR TAKE 1 TABLET BY MOUTH AT BEDTIME   multivitamin capsule Take 1 capsule by mouth daily.   mupirocin ointment 2 % Commonly known as: BACTROBAN Apply 1 Application topically 2 (two) times daily as needed (dry skin).   Opsumit 10 MG tablet Generic drug: macitentan Take 10  mg by mouth daily.   OXYGEN Inhale into the lungs. 2 LMP at night and upon exertion   pantoprazole 40 MG tablet Commonly known as: Protonix Take 1 tablet (40 mg total) by mouth daily.   polyethylene glycol 17 g packet Commonly known as: MIRALAX / GLYCOLAX Take 17 g by mouth daily.   spironolactone 25 MG tablet Commonly known as: ALDACTONE Take 0.5 tablets (12.5 mg total) by mouth daily.   sucralfate 1 GM/10ML suspension Commonly known as: CARAFATE TAKE 10 MLS BY MOUTH EVERY 6 HOURS AS NEEDED FOR REFLUX   Vitamin B-12 1000 MCG Subl Place 1,000 mcg under the tongue. Taking twice a week        Follow-up Information      [facility] Follow up on 06/12/2023.   Specialty: Cardiology Why: Please come to our office to obtain BMET blood work to recheck kidney function and electrolyte, no need to be fasting. Lab opens from 8AM until 4:30PM. Contact information: [address] [place] [address] [phone]        Ronney Asters, NP Follow up on 06/19/2023.   Specialty: Cardiology Why:  8:Jaxson.Roy. Cardiology follow up Contact information: [address] [place] [address] [phone]                Discharge Exam: Filed Weights   06/04/23 1658 06/05/23 0341 06/06/23 0416  Weight: 45.8 kg 48.5 kg 45.3 kg   Physical Exam Vitals and nursing note reviewed.  Constitutional:      Appearance: Normal appearance. She is not ill-appearing.  HENT:     Head: Normocephalic and atraumatic.     Mouth/Throat:     Mouth: Mucous membranes are moist.  Cardiovascular:     Rate and Rhythm: Normal rate and regular rhythm.  Abdominal:     General: Abdomen is flat. There is no distension.     Palpations: Abdomen is soft. There is no mass.     Tenderness: There is no abdominal tenderness.  Musculoskeletal:     Right lower leg: No edema.     Left lower leg: No edema.  Skin:    General: Skin is warm and dry.      Capillary Refill: Capillary refill takes less than 2 seconds.  Neurological:     Mental Status: She is alert.  Psychiatric:        Mood and Affect: Mood normal.        Behavior: Behavior normal.      Condition at discharge: good  The results of significant diagnostics from this hospitalization (including imaging, microbiology, ancillary and laboratory) are listed below for reference.   Imaging Studies: ECHOCARDIOGRAM COMPLETE  Result Date: 06/05/2023    ECHOCARDIOGRAM REPORT   Patient Name:   KYIARA TRANK Date of Exam: 06/05/2023 Medical Rec #:  [id number]       Height:       63.0 in Accession #:    [id number]      Weight:       106.9 lb Date of Birth:  [birth year]       BSA:          1.482 m Patient Age:    86 years        BP:           136/66 mmHg Patient Gender: F               HR:           88 bpm. Exam Location:  Inpatient Procedure: 2D Echo and Color Doppler Indications:    CHF- Acute Diastolic  History:        Patient has prior history of Echocardiogram examinations, most                 recent 01/15/2023. Pericardial Effusion, Pulmonary HTN; Risk                 Factors:CKD. Pulmonary Emblous.  Sonographer:    Raeford Razor RDCS Referring Phys: [id number] VASUNDHRA RATHORE IMPRESSIONS  1. Left ventricular ejection fraction, by estimation, is 70 to 75%. The left ventricle has hyperdynamic function. The left ventricle has no regional wall motion abnormalities. There is moderate concentric left ventricular hypertrophy. Indeterminate diastolic filling due to E-A fusion.  2. Right ventricular systolic function is mildly reduced. The right ventricular size is normal.  3. Left atrial size was severely dilated.  4. Large pericardial effusion surrounds heart, mainly posteriorly and lateral to LV. [place]un[place] dimension is 5.6 cm posteriorly. It has increased in size from echo done in June 2024. Does not meet criteria for tamponade by echo criteria. Clinical correlation indicated .Marland Kitchen  Large pericardial effusion.   5. MV annulus is severely calcified. MV is thickened with restricted motion. Difficult to see well Mean gradient through the MV is 8 mm Hg. MVA (Pt1/2 ) 3 cm2. Compared to echo report from June 2024, mean gradient is incresaed (5 to 8 mm Hg) Consider TEE to evaluate further.. Trivial mitral valve regurgitation.  6. AV is thickened, calcified with restricted motion. Peak and mean gradients through the valve are 59 and 37 mm Hg respectively. Dimensionless index is 0.33 consistent with moderate AS.Marland Kitchen The aortic valve is abnormal. Aortic valve regurgitation is not visualized.  7. The inferior vena cava is normal in size with <50% respiratory variability, suggesting right atrial pressure of 8 mmHg. FINDINGS  Left Ventricle: Left ventricular ejection fraction, by estimation, is 70 to 75%. The left ventricle has hyperdynamic function. The left ventricle has no regional wall motion abnormalities. The left ventricular internal cavity size was small. There is moderate concentric left ventricular hypertrophy. Indeterminate diastolic filling due to E-A fusion. Right Ventricle: The right ventricular size is normal. Right vetricular wall thickness was not assessed. Right ventricular systolic function is mildly reduced. Left Atrium: Left atrial size was severely dilated. Right Atrium: Right atrial size was normal in size. Pericardium: Large pericardial effusion surrounds heart, mainly posteriorly and lateral to LV. [place]axunak dimension is 5.6 cm posteriorly. It has increased in size from echo done in June 2024. Does not meet criteria for tamponade by echo criteria. Clinical correlation indicated. A large pericardial effusion is present. Mitral Valve: MV annulus is severely calcified. MV is thickened with restricted motion. Difficult to see well Mean gradient through the MV is 8 mm Hg. MVA (Pt1/2 ) 3 cm2. Compared to echo report from June 2024, mean gradient is incresaed (5 to 8 mm Hg) Consider TEE to evaluate further. Trivial mitral  valve regurgitation. MV peak gradient, 13.2 mmHg. The mean mitral valve gradient is 8.0 mmHg. Tricuspid Valve: The tricuspid valve is normal in structure. Tricuspid valve regurgitation is mild. Aortic Valve: AV is thickened, calcified with restricted motion. Peak and mean gradients through the valve are 59 and 37 mm Hg respectively. Dimensionless index is 0.33 consistent with moderate AS. The aortic valve is abnormal. Aortic valve regurgitation  is not visualized. Aortic valve mean gradient measures 37.0 mmHg. Aortic valve peak gradient measures 58.7 mmHg. Aortic valve area, by VTI measures 0.95 cm. Pulmonic Valve: The pulmonic valve was not well visualized. Pulmonic valve regurgitation is mild. Aorta: The aortic root and ascending aorta are structurally normal, with no evidence of dilitation. Venous: The inferior vena cava is normal in size with less than 50% respiratory variability, suggesting right atrial pressure of 8 mmHg. IAS/Shunts: No atrial level shunt detected by color flow Doppler.  LEFT VENTRICLE PLAX 2D LVIDd:         3.50 cm   Diastology LVIDs:         2.00 cm   LV e' medial:    8.21 cm/s LV PW:         1.30 cm   LV E/e' medial:  13.4 LV IVS:        1.40 cm   LV e' lateral:   6.74 cm/s LVOT diam:     1.90 cm   LV E/e' lateral: 16.3 LV SV:         70 LV SV Index:   47 LVOT Area:     2.84 cm  RIGHT VENTRICLE RV Basal diam:  1.90 cm RV S prime:  18.60 cm/s TAPSE (M-mode): 2.2 cm LEFT ATRIUM              Index        RIGHT ATRIUM           Index LA diam:        3.80 cm  2.56 cm/m   RA Area:     14.90 cm LA Vol (A2C):   107.0 ml 72.21 ml/m  RA Volume:   36.20 ml  24.43 ml/m LA Vol (A4C):   75.0 ml  50.61 ml/m LA Biplane Vol: 92.1 ml  62.15 ml/m  AORTIC VALVE AV Area (Vmax):    1.07 cm AV Area (Vmean):   0.94 cm AV Area (VTI):     0.95 cm AV Vmax:           383.00 cm/s AV Vmean:          290.000 cm/s AV VTI:            0.740 m AV Peak Grad:      58.7 mmHg AV Mean Grad:      37.0 mmHg LVOT Vmax:          144.00 cm/s LVOT Vmean:        96.500 cm/s LVOT VTI:          0.247 m LVOT/AV VTI ratio: 0.33  AORTA Ao Asc diam: 3.20 cm MITRAL VALVE                TRICUSPID VALVE MV Area (PHT): 1.59 cm     TR Peak grad:   58.7 mmHg MV Peak grad:  13.2 mmHg    TR Vmax:        383.00 cm/s MV Mean grad:  8.0 mmHg MV Vmax:       1.82 m/s     SHUNTS MV Vmean:      139.0 cm/s   Systemic VTI:  0.25 m MV PHT:        71.45 msec   Systemic Diam: 1.90 cm MV Decel Time: 182 msec MV E velocity: 110.00 cm/s MV A velocity: 147.00 cm/s MV E/A ratio:  0.75 Dietrich Pates MD Electronically signed by Dietrich Pates MD Signature Date/Time: 06/05/2023/6:21:35 PM    Final    DG Chest 2 View  Result Date: 06/04/2023 CLINICAL DATA:  Shortness of breath, chest tightness EXAM: CHEST - 2 VIEW COMPARISON:  Chest radiograph dated 05/13/2023. CT chest dated 05/13/2022 FINDINGS: Stable cardiomegaly. Pericardial effusion is suspected, as noted on prior CT. Chronic additional markings, likely reflecting chronic interstitial lung disease when correlating with prior CT. No frank interstitial edema. No definite pleural effusions.  No pneumothorax. Surgical clips along the left chest wall/axilla. IMPRESSION: Stable cardiomegaly with suspected pericardial effusion. Electronically Signed   By: Charline Bills M.D.   On: 06/04/2023 18:51   DG Chest 2 View  Result Date: 05/13/2023 CLINICAL DATA:  Shortness of breath. EXAM: CHEST - 2 VIEW COMPARISON:  December 02, 2022. FINDINGS: Stable moderate to severe cardiomegaly. Mild interstitial densities are noted throughout both lungs which may represent chronic interstitial lung disease. Mild left basilar atelectasis and small pleural effusion is noted. Bony thorax is unremarkable. IMPRESSION: Stable cardiomegaly. Stable interstitial lung densities are noted consistent with chronic interstitial lung disease. Probable left basilar atelectasis and small pleural effusion is noted. Electronically Signed   By: Lupita Raider M.D.   On: 05/13/2023 15:46    Microbiology: Results for orders placed or performed during the hospital  encounter of 04/03/23  Urine Culture     Status: Abnormal   Collection Time: 04/03/23  4:03 PM   Specimen: Urine, Clean Catch  Result Value Ref Range Status   Specimen Description URINE, CLEAN CATCH  Final   Special Requests NONE  Final   Culture (A)  Final    <10,000 COLONIES/mL INSIGNIFICANT GROWTH Performed at [facility], [address], [place], [place] [address]    Report Status 04/05/2023 FINAL  Final   *Note: Due to a large number of results and/or encounters for the requested time period, some results have not been displayed. A complete set of results can be found in Results Review.    Labs: CBC: Recent Labs  Lab 06/04/23 1713  WBC 6.1  HGB 9.4*  HCT 30.0*  MCV 111.5*  PLT 172   Basic Metabolic Panel: Recent Labs  Lab 06/04/23 1713 06/04/23 2353 06/06/23 0230  NA 133* 133* 132*  K 4.7 4.4 3.8  CL 96* 94* 91*  CO2 24 30 30   GLUCOSE 92 107* 91  BUN 34* 33* 33*  CREATININE 1.26* 1.02* 1.07*  CALCIUM 8.9 9.3 8.6*  MG  --   --  1.7  PHOS  --   --  4.0   Liver Function Tests: Recent Labs  Lab 06/06/23 0230  ALBUMIN 3.1*   CBG: No results for input(s): "GLUCAP" in the last 168 hours.  Discharge time spent: greater than 30 minutes.  Signed: Charolotte Eke, MD [facility] 06/10/2023

## 2023-06-12 ENCOUNTER — Other Ambulatory Visit: Payer: Self-pay

## 2023-06-12 DIAGNOSIS — I5032 Chronic diastolic (congestive) heart failure: Secondary | ICD-10-CM | POA: Diagnosis not present

## 2023-06-13 LAB — BASIC METABOLIC PANEL
BUN/Creatinine Ratio: 33 — ABNORMAL HIGH (ref 12–28)
BUN: 44 mg/dL — ABNORMAL HIGH (ref 8–27)
CO2: 29 mmol/L (ref 20–29)
Calcium: 9.2 mg/dL (ref 8.7–10.3)
Chloride: 96 mmol/L (ref 96–106)
Creatinine, Ser: 1.32 mg/dL — ABNORMAL HIGH (ref 0.57–1.00)
Glucose: 88 mg/dL (ref 70–99)
Potassium: 4.3 mmol/L (ref 3.5–5.2)
Sodium: 139 mmol/L (ref 134–144)
eGFR: 40 mL/min/{1.73_m2} — ABNORMAL LOW (ref >59)

## 2023-06-15 ENCOUNTER — Telehealth: Payer: Self-pay | Admitting: Physician Assistant

## 2023-06-16 ENCOUNTER — Other Ambulatory Visit (INDEPENDENT_AMBULATORY_CARE_PROVIDER_SITE_OTHER): Payer: Medicare PPO

## 2023-06-16 DIAGNOSIS — I272 Pulmonary hypertension, unspecified: Secondary | ICD-10-CM | POA: Diagnosis not present

## 2023-06-16 LAB — COMPREHENSIVE METABOLIC PANEL
ALT: 9 U/L (ref 0–35)
AST: 18 U/L (ref 0–37)
Albumin: 3.8 g/dL (ref 3.5–5.2)
Alkaline Phosphatase: 61 U/L (ref 39–117)
BUN: 44 mg/dL — ABNORMAL HIGH (ref 6–23)
CO2: 33 mEq/L — ABNORMAL HIGH (ref 19–32)
Calcium: 9.3 mg/dL (ref 8.4–10.5)
Chloride: 94 mEq/L — ABNORMAL LOW (ref 96–112)
Creatinine, Ser: 1.21 mg/dL — ABNORMAL HIGH (ref 0.40–1.20)
GFR: 41.07 mL/min — ABNORMAL LOW (ref >60.00)
Glucose, Bld: 94 mg/dL (ref 70–99)
Potassium: 4.3 mEq/L (ref 3.5–5.1)
Sodium: 134 mEq/L — ABNORMAL LOW (ref 135–145)
Total Bilirubin: 0.6 mg/dL (ref 0.2–1.2)
Total Protein: 6.4 g/dL (ref 6.0–8.3)

## 2023-06-16 LAB — CBC
HCT: 27.8 % — ABNORMAL LOW (ref 36.0–46.0)
Hemoglobin: 9.1 g/dL — ABNORMAL LOW (ref 12.0–15.0)
MCHC: 32.8 g/dL (ref 30.0–36.0)
MCV: 106.4 fl — ABNORMAL HIGH (ref 78.0–100.0)
Platelets: 211 10*3/uL (ref 150.0–400.0)
RBC: 2.62 Mil/uL — ABNORMAL LOW (ref 3.87–5.11)
RDW: 12.9 % (ref 11.5–15.5)
WBC: 5.2 10*3/uL (ref 4.0–10.5)

## 2023-06-17 ENCOUNTER — Inpatient Hospital Stay: Payer: Medicare PPO

## 2023-06-17 ENCOUNTER — Inpatient Hospital Stay: Payer: Medicare PPO | Admitting: Physician Assistant

## 2023-06-17 ENCOUNTER — Ambulatory Visit: Payer: Medicare PPO

## 2023-06-17 NOTE — Progress Notes (Signed)
Cardiology Office Note:  .   Date:  06/19/2023  ID:  Paula Ward, DOB 1935/11/13, MRN [id number] PCP: [name], MD  [facility] Providers Cardiologist:  Olga Millers, MD    History of Present Illness: .   Paula Ward is a 87 y.o. female with past medical history of moderate aortic stenosis, pulmonary hypertension secondary to scleroderma chronic pericardial effusion, chronic kidney disease, history of pulmonary embolus, gastrointestinal bleeding secondary to AV malformations, requiring IVC filter placement and breast cancer.  She is followed in the pulmonary hypertension clinic at [facility] as well.  Dr. Jens Som is her primary cardiologist.  She had cardiac catheterization in July 2023 that showed no obstructive coronary artery disease, mild pulmonary hypertension, moderate aortic stenosis mean gradient of 16 mmHg and aortic valve area 1.2 cm.  On 06/04/2023 she presented to the emergency room with increased shortness of breath, chest tightness, orthopnea and lower extremity edema.  Oxygen saturations were 80% on room air.  Her BNP was elevated at 2680.  Chest x-ray showed stable cardiomegaly with suspected pericardial effusion, no pulmonary edema or pleural effusions.  Echo during admission indicated LVEF of 70 to 75%, LV with hyperdynamic function, no RWMA, moderate concentric LV hypertrophy.  RV systolic function was mildly reduced, LA severely dilated.  Large pericardial effusion, noted to have been slightly increased in size from prior echo in June, no evidence of tamponade severe MAC, moderate AS.  She was treated with IV diuretics and was discharged in stable condition on 06/06/2023.  Today she presents for follow-up regarding her recent hospital admission.  She reports that she is doing very well overall.  She denies chest pain and feels that her shortness of breath is at her baseline.  She has been monitoring her weights at home which have been overall stable.   Reports that she is to have lab work again with the cancer center on 06/30/2023.  ROS: Today she denies chest pain, shortness of breath, lower extremity edema, fatigue, palpitations, melena, hematuria, hemoptysis, diaphoresis, weakness, presyncope, syncope, orthopnea, and PND.   Studies Reviewed: .   Cardiac Studies & Procedures   CARDIAC CATHETERIZATION  CARDIAC CATHETERIZATION 04/25/2022  Narrative 1.  Ectatic but patent coronary arteries with no obstructive disease.  Right dominant coronary artery. 2.  Mild pulmonary hypertension with PA pressure 52/15, mean 28 mmHg, transpulmonary gradient 10 mmHg, PVR approximately 2 Wood units. 3.  Moderate aortic stenosis with mean transvalvular gradient 16 mmHg calculated aortic valve area 1.16 cm 4.  Heavy mitral annular calcification by plain fluoroscopy 5.  Mild aortic valve calcification by plain fluoroscopy  Recommend: Continued medical therapy, clinical follow-up, echo surveillance.  The patient does not appear to have severe aortic stenosis and I do not think she would benefit from TAVR at this time.  Findings Coronary Findings Diagnostic  Dominance: Right  Left Main Large, ectatic vessel without stenosis.  Divides into the LAD and left circumflex.  Left Anterior Descending The vessel exhibits minimal luminal irregularities. The LAD is patent to the apex.  The diagonal branches are patent.  The vessel is difficult to fill because of brisk flow.  Left Circumflex The vessel exhibits minimal luminal irregularities. Large vessel with no obstructive disease.  Right Coronary Artery Vessel is large. The vessel exhibits minimal luminal irregularities. Large, dominant RCA.  No obstructive disease.  PDA and PLA branches are patent.  Intervention  No interventions have been documented.     ECHOCARDIOGRAM  ECHOCARDIOGRAM COMPLETE 06/05/2023  Narrative ECHOCARDIOGRAM REPORT    Patient Name:   Paula Ward Date of Exam:  06/05/2023 Medical Rec #:  [id number]       Height:       63.0 in Accession #:    [id number]      Weight:       106.9 lb Date of Birth:  09-Jul-1936       BSA:          1.482 m Patient Age:    86 years        BP:           136/66 mmHg Patient Gender: F               HR:           88 bpm. Exam Location:  Inpatient  Procedure: 2D Echo and Color Doppler  Indications:    CHF- Acute Diastolic  History:        Patient has prior history of Echocardiogram examinations, most recent 01/15/2023. Pericardial Effusion, Pulmonary HTN; Risk Factors:CKD. Pulmonary Emblous.  Sonographer:    Raeford Razor RDCS Referring Phys: [id number] VASUNDHRA RATHORE  IMPRESSIONS   1. Left ventricular ejection fraction, by estimation, is 70 to 75%. The left ventricle has hyperdynamic function. The left ventricle has no regional wall motion abnormalities. There is moderate concentric left ventricular hypertrophy. Indeterminate diastolic filling due to E-A fusion. 2. Right ventricular systolic function is mildly reduced. The right ventricular size is normal. 3. Left atrial size was severely dilated. 4. Large pericardial effusion surrounds heart, mainly posteriorly and lateral to LV. Naxunak dimension is 5.6 cm posteriorly. It has increased in size from echo done in June 2024. Does not meet criteria for tamponade by echo criteria. Clinical correlation indicated .Marland Kitchen Large pericardial effusion. 5. MV annulus is severely calcified. MV is thickened with restricted motion. Difficult to see well Mean gradient through the MV is 8 mm Hg. MVA (Pt1/2 ) 3 cm2. Compared to echo report from June 2024, mean gradient is incresaed (5 to 8 mm Hg) Consider TEE to evaluate further.. Trivial mitral valve regurgitation. 6. AV is thickened, calcified with restricted motion. Peak and mean gradients through the valve are 59 and 37 mm Hg respectively. Dimensionless index is 0.33 consistent with moderate AS.Marland Kitchen The aortic valve is abnormal. Aortic valve  regurgitation is not visualized. 7. The inferior vena cava is normal in size with <50% respiratory variability, suggesting right atrial pressure of 8 mmHg.  FINDINGS Left Ventricle: Left ventricular ejection fraction, by estimation, is 70 to 75%. The left ventricle has hyperdynamic function. The left ventricle has no regional wall motion abnormalities. The left ventricular internal cavity size was small. There is moderate concentric left ventricular hypertrophy. Indeterminate diastolic filling due to E-A fusion.  Right Ventricle: The right ventricular size is normal. Right vetricular wall thickness was not assessed. Right ventricular systolic function is mildly reduced.  Left Atrium: Left atrial size was severely dilated.  Right Atrium: Right atrial size was normal in size.  Pericardium: Large pericardial effusion surrounds heart, mainly posteriorly and lateral to LV. Naxunak dimension is 5.6 cm posteriorly. It has increased in size from echo done in June 2024. Does not meet criteria for tamponade by echo criteria. Clinical correlation indicated. A large pericardial effusion is present.  Mitral Valve: MV annulus is severely calcified. MV is thickened with restricted motion. Difficult to see well Mean gradient through the MV is 8 mm Hg. MVA (Pt1/2 ) 3 cm2. Compared to echo  report from June 2024, mean gradient is incresaed (5 to 8 mm Hg) Consider TEE to evaluate further. Trivial mitral valve regurgitation. MV peak gradient, 13.2 mmHg. The mean mitral valve gradient is 8.0 mmHg.  Tricuspid Valve: The tricuspid valve is normal in structure. Tricuspid valve regurgitation is mild.  Aortic Valve: AV is thickened, calcified with restricted motion. Peak and mean gradients through the valve are 59 and 37 mm Hg respectively. Dimensionless index is 0.33 consistent with moderate AS. The aortic valve is abnormal. Aortic valve regurgitation is not visualized. Aortic valve mean gradient measures 37.0 mmHg.  Aortic valve peak gradient measures 58.7 mmHg. Aortic valve area, by VTI measures 0.95 cm.  Pulmonic Valve: The pulmonic valve was not well visualized. Pulmonic valve regurgitation is mild.  Aorta: The aortic root and ascending aorta are structurally normal, with no evidence of dilitation.  Venous: The inferior vena cava is normal in size with less than 50% respiratory variability, suggesting right atrial pressure of 8 mmHg.  IAS/Shunts: No atrial level shunt detected by color flow Doppler.   LEFT VENTRICLE PLAX 2D LVIDd:         3.50 cm   Diastology LVIDs:         2.00 cm   LV e' medial:    8.21 cm/s LV PW:         1.30 cm   LV E/e' medial:  13.4 LV IVS:        1.40 cm   LV e' lateral:   6.74 cm/s LVOT diam:     1.90 cm   LV E/e' lateral: 16.3 LV SV:         70 LV SV Index:   47 LVOT Area:     2.84 cm   RIGHT VENTRICLE RV Basal diam:  1.90 cm RV S prime:     18.60 cm/s TAPSE (M-mode): 2.2 cm  LEFT ATRIUM              Index        RIGHT ATRIUM           Index LA diam:        3.80 cm  2.56 cm/m   RA Area:     14.90 cm LA Vol (A2C):   107.0 ml 72.21 ml/m  RA Volume:   36.20 ml  24.43 ml/m LA Vol (A4C):   75.0 ml  50.61 ml/m LA Biplane Vol: 92.1 ml  62.15 ml/m AORTIC VALVE AV Area (Vmax):    1.07 cm AV Area (Vmean):   0.94 cm AV Area (VTI):     0.95 cm AV Vmax:           383.00 cm/s AV Vmean:          290.000 cm/s AV VTI:            0.740 m AV Peak Grad:      58.7 mmHg AV Mean Grad:      37.0 mmHg LVOT Vmax:         144.00 cm/s LVOT Vmean:        96.500 cm/s LVOT VTI:          0.247 m LVOT/AV VTI ratio: 0.33  AORTA Ao Asc diam: 3.20 cm  MITRAL VALVE                TRICUSPID VALVE MV Area (PHT): 1.59 cm     TR Peak grad:   58.7 mmHg MV Peak grad:  13.2 mmHg  TR Vmax:        383.00 cm/s MV Mean grad:  8.0 mmHg MV Vmax:       1.82 m/s     SHUNTS MV Vmean:      139.0 cm/s   Systemic VTI:  0.25 m MV PHT:        71.45 msec   Systemic Diam: 1.90 cm MV  Decel Time: 182 msec MV E velocity: 110.00 cm/s MV A velocity: 147.00 cm/s MV E/A ratio:  0.75  Dietrich Pates MD Electronically signed by Dietrich Pates MD Signature Date/Time: 06/05/2023/6:21:35 PM    Final                      Physical Exam:   VS:  BP 124/72   Pulse 76   Temp (!) 97.3 F (36.3 C)   Ht 5\' 3"  (1.6 m)   Wt 103 lb (46.7 kg)   SpO2 94%   BMI 18.25 kg/m    Wt Readings from Last 3 Encounters:  06/19/23 103 lb (46.7 kg)  06/06/23 99 lb 13.9 oz (45.3 kg)  06/03/23 105 lb (47.6 kg)    GEN: Well nourished, well developed in no acute distress NECK: No JVD; No carotid bruits CARDIAC: RRR, 2/6 systolic murmur, no rubs, gallops RESPIRATORY:  Slight crackles in left lower lobe, otherwise clear to auscultation without rales, wheezing or rhonchi  ABDOMEN: Soft, non-tender, non-distended EXTREMITIES:  No edema; No deformity   ASSESSMENT AND PLAN: .    Chronic diastolic congestive HF: Echo during admission indicated LVEF of 70 to 75%, LV with hyperdynamic function, no RWMA, moderate concentric LV hypertrophy. Follow up BMET showed slight increased in creatinine, most recent on 06/16/23 shows creatinine 1.21, appears close to baseline compared to prior readings. Today she is euvolemic and well compensated on exam today. She does have slight crackles in the left lower lung, she reports this is chronic, notes she has not yet taken her fluid pill today, instructed to take when she gets home. Her weights appear overall stable at home. Continue lasix 40mg  daily and spironolactone 12.5 mg daily. She has CMP scheduled with cancer center on 06/30/23 for further monitoring of her creatinine. Refill of her Lasix sent to her home pharmacy.   Pericardial effusion: Noted to have been present by Dr. Jens Som for 20 years.  Noted to have been slightly larger on echo during admission compared to echo from June, although no evidence of tamponade on echo during admission. Will have repeat echocardiogram  in 6 months.   Valvular disease: Echo during admission indicated moderate to severe aortic stenosis, severe MAC.  Noted to potentially need TAVR in the future if felt to be a candidate.  Will plan to have follow-up echocardiogram in 6 months.  Pulmonary HTN: Previously followed by [facility] pulmonary hypertension clinic.  Continue Opsumit.   HTN: Blood pressure well controlled today at 124/72. Blood pressure at home well controlled averaging 116/60, ranging from 102/75- 127/78. Continue current anti-hypertensive regimen.   CKD stage 3: Last creatinine 1.21 on 06/16/23. Will have repeat CMP with cancer center on 06/30/23.        Dispo: Follow up with Dr. Jens Som in 3 months.   Signed, Rip Harbour, NP

## 2023-06-19 ENCOUNTER — Encounter: Payer: Self-pay | Admitting: General Practice

## 2023-06-19 ENCOUNTER — Ambulatory Visit (INDEPENDENT_AMBULATORY_CARE_PROVIDER_SITE_OTHER): Payer: Medicare PPO | Admitting: Internal Medicine

## 2023-06-19 ENCOUNTER — Encounter: Payer: Self-pay | Admitting: Internal Medicine

## 2023-06-19 ENCOUNTER — Ambulatory Visit: Payer: Medicare PPO | Attending: General Practice | Admitting: Cardiology

## 2023-06-19 VITALS — BP 120/60 | HR 81 | Temp 97.6°F | Ht 63.0 in | Wt 103.5 lb

## 2023-06-19 VITALS — BP 124/72 | HR 76 | Temp 97.3°F | Ht 63.0 in | Wt 103.0 lb

## 2023-06-19 DIAGNOSIS — I272 Pulmonary hypertension, unspecified: Secondary | ICD-10-CM

## 2023-06-19 DIAGNOSIS — I10 Essential (primary) hypertension: Secondary | ICD-10-CM | POA: Diagnosis not present

## 2023-06-19 DIAGNOSIS — D638 Anemia in other chronic diseases classified elsewhere: Secondary | ICD-10-CM

## 2023-06-19 DIAGNOSIS — I3139 Other pericardial effusion (noninflammatory): Secondary | ICD-10-CM

## 2023-06-19 DIAGNOSIS — I350 Nonrheumatic aortic (valve) stenosis: Secondary | ICD-10-CM

## 2023-06-19 DIAGNOSIS — M2170 Unequal limb length (acquired), unspecified site: Secondary | ICD-10-CM | POA: Diagnosis not present

## 2023-06-19 DIAGNOSIS — I5032 Chronic diastolic (congestive) heart failure: Secondary | ICD-10-CM

## 2023-06-19 MED ORDER — FUROSEMIDE 20 MG PO TABS: 40.0000 mg | ORAL_TABLET | Freq: Every day | ORAL | 1 refills | Status: DC

## 2023-06-19 NOTE — Assessment & Plan Note (Signed)
We did discuss that her opsumit could be contributing to the anemia and she could pursue bone marrow biopsy but her chronic illness could also be contributing.

## 2023-06-19 NOTE — Assessment & Plan Note (Signed)
No flare today and is not taking fluid pills currently. Weight stable and weighing daily.

## 2023-06-19 NOTE — Patient Instructions (Addendum)
Medication Instructions: The current medical regimen is effective;  continue present plan and medications as directed. Please refer to the Current Medication list given to you today.  *If you need a refill on your cardiac medications before your next appointment, please call your pharmacy*  Lab Work: none If you have labs (blood work) drawn today and your tests are completely normal, you will receive your results only by: MyChart Message (if you have MyChart) OR A paper copy in the mail If you have any lab test that is abnormal or we need to change your treatment, we will call you to review the results.  Testing/Procedures: Your physician has requested that you have an echocardiogram. Echocardiography  (schedule in feb 2025) is a painless test that uses sound waves to create images of your heart. It provides your doctor with information about the size and shape of your heart and how well your heart's chambers and valves are working. This procedure takes approximately one hour. There are no restrictions for this procedure. Please do NOT wear cologne, perfume, aftershave, or lotions (deodorant is allowed). Please arrive 15 minutes prior to your appointment time.    Follow-Up: At [facility], you and your health needs are our priority.  As part of our continuing mission to provide you with exceptional heart care, we have created designated Provider Care Teams.  These Care Teams include your primary Cardiologist (physician) and Advanced Practice Providers (APPs -  Physician Assistants and Nurse Practitioners) who all work together to provide you with the care you need, when you need it.  We recommend signing up for the patient portal called "MyChart".  Sign up information is provided on this After Visit Summary.  MyChart is used to connect with patients for Virtual Visits (Telemedicine).  Patients are able to view lab/test results, encounter notes, upcoming appointments, etc.  Non-urgent  messages can be sent to your provider as well.   To learn more about what you can do with MyChart, go to [email or web address].    Your next appointment:   3 month(s)  Provider:   Olga Millers, MD  or Edd Fabian, FNP or Reather Littler, NP         Other Instructions

## 2023-06-19 NOTE — Progress Notes (Signed)
   Subjective:   Patient ID: Paula Ward, female    DOB: [birth year], 87 y.o.   MRN: [id number]  HPI The patient is an 87 YO female coming in for hospital follow up and concerns about anemia.   PMH, [facility], social history reviewed and updated. Med reconciliation done  Review of Systems  Constitutional:  Positive for fatigue.  HENT: Negative.    Eyes: Negative.   Respiratory:  Negative for cough, chest tightness and shortness of breath.   Cardiovascular:  Negative for chest pain, palpitations and leg swelling.  Gastrointestinal:  Negative for abdominal distention, abdominal pain, constipation, diarrhea, nausea and vomiting.  Musculoskeletal: Negative.   Skin:  Positive for rash.  Neurological: Negative.   Psychiatric/Behavioral: Negative.      Objective:  Physical Exam Constitutional:      Appearance: She is well-developed.  HENT:     Head: Normocephalic and atraumatic.  Cardiovascular:     Rate and Rhythm: Normal rate and regular rhythm.  Pulmonary:     Effort: Pulmonary effort is normal. No respiratory distress.     Breath sounds: Normal breath sounds. No wheezing or rales.  Abdominal:     General: Bowel sounds are normal. There is no distension.     Palpations: Abdomen is soft.     Tenderness: There is no abdominal tenderness. There is no rebound.  Musculoskeletal:     Cervical back: Normal range of motion.  Skin:    General: Skin is warm and dry.     Findings: Rash present.  Neurological:     Mental Status: She is alert and oriented to person, place, and time.     Coordination: Coordination normal.     Vitals:   06/19/23 1403  BP: 120/60  Pulse: 81  Temp: 97.6 F (36.4 C)  TempSrc: Oral  SpO2: 90%  Weight: 103 lb 8 oz (46.9 kg)  Height: 5\' 3"  (1.6 m)    Assessment & Plan:  Visit time 25 minutes in face to face communication with patient and coordination of care, additional 10 minutes spent in record review, coordination or care, ordering tests,  communicating/referring to other healthcare professionals, documenting in medical records all on the same day of the visit for total time 35 minutes spent on the visit.

## 2023-06-19 NOTE — Patient Instructions (Signed)
Talk to pulmonary doctor about maybe changing opsumit.

## 2023-06-23 ENCOUNTER — Telehealth: Payer: Self-pay | Admitting: Nurse Practitioner

## 2023-06-23 NOTE — Telephone Encounter (Signed)
Patient would like for Cobb,NP to know that she was in the hospital a few weeks ago with fluid in her legs. She states that she has gained two pounds and thinks that it is more fluid in her legs. Please call and advise [phone] or [phone].

## 2023-06-24 ENCOUNTER — Encounter (HOSPITAL_COMMUNITY): Payer: Self-pay

## 2023-06-24 ENCOUNTER — Inpatient Hospital Stay: Payer: Medicare PPO

## 2023-06-24 ENCOUNTER — Encounter: Payer: Self-pay | Admitting: Physician Assistant

## 2023-06-25 NOTE — Telephone Encounter (Signed)
Left detailed message per [facility] recommendations.

## 2023-06-25 NOTE — Telephone Encounter (Signed)
Please advise her to call her cardiologist as they are the ones who manage her heart failure and this is a symptom of possible exacerbation. Their number is [phone] Thanks!

## 2023-06-26 ENCOUNTER — Ambulatory Visit: Payer: Medicare PPO | Admitting: Pulmonary Disease

## 2023-06-29 ENCOUNTER — Telehealth: Payer: Self-pay | Admitting: Internal Medicine

## 2023-06-29 NOTE — Telephone Encounter (Signed)
Patient said she is developing a skin rash and thinks it is related to furosemide (LASIX) 20 MG tablet. She declined coming in for an appointment and said she would like to speak with a nurse. Best callback is [phone].

## 2023-06-30 ENCOUNTER — Inpatient Hospital Stay: Payer: Medicare PPO | Admitting: Physician Assistant

## 2023-06-30 ENCOUNTER — Encounter (HOSPITAL_COMMUNITY): Payer: Self-pay

## 2023-06-30 ENCOUNTER — Inpatient Hospital Stay: Payer: Medicare PPO

## 2023-06-30 ENCOUNTER — Ambulatory Visit: Payer: Medicare PPO

## 2023-06-30 ENCOUNTER — Encounter: Payer: Self-pay | Admitting: Physician Assistant

## 2023-06-30 ENCOUNTER — Inpatient Hospital Stay: Payer: Medicare PPO | Attending: Physician Assistant

## 2023-06-30 VITALS — BP 121/40 | HR 82 | Temp 97.7°F | Resp 16 | Ht 63.0 in | Wt 106.3 lb

## 2023-06-30 DIAGNOSIS — D649 Anemia, unspecified: Secondary | ICD-10-CM | POA: Insufficient documentation

## 2023-06-30 DIAGNOSIS — E538 Deficiency of other specified B group vitamins: Secondary | ICD-10-CM

## 2023-06-30 LAB — CMP (CANCER CENTER ONLY)
ALT: 7 U/L (ref 0–44)
AST: 20 U/L (ref 15–41)
Albumin: 4.1 g/dL (ref 3.5–5.0)
Alkaline Phosphatase: 66 U/L (ref 38–126)
Anion gap: 6 (ref 5–15)
BUN: 37 mg/dL — ABNORMAL HIGH (ref 8–23)
CO2: 33 mmol/L — ABNORMAL HIGH (ref 22–32)
Calcium: 9.2 mg/dL (ref 8.9–10.3)
Chloride: 95 mmol/L — ABNORMAL LOW (ref 98–111)
Creatinine: 1.32 mg/dL — ABNORMAL HIGH (ref 0.44–1.00)
GFR, Estimated: 40 mL/min — ABNORMAL LOW (ref >60)
Glucose, Bld: 100 mg/dL — ABNORMAL HIGH (ref 70–99)
Potassium: 4.4 mmol/L (ref 3.5–5.1)
Sodium: 134 mmol/L — ABNORMAL LOW (ref 135–145)
Total Bilirubin: 0.7 mg/dL (ref 0.3–1.2)
Total Protein: 6.6 g/dL (ref 6.5–8.1)

## 2023-06-30 LAB — CBC WITH DIFFERENTIAL (CANCER CENTER ONLY)
Abs Immature Granulocytes: 0.02 10*3/uL (ref 0.00–0.07)
Basophils Absolute: 0 10*3/uL (ref 0.0–0.1)
Basophils Relative: 1 %
Eosinophils Absolute: 0.1 10*3/uL (ref 0.0–0.5)
Eosinophils Relative: 2 %
HCT: 28.5 % — ABNORMAL LOW (ref 36.0–46.0)
Hemoglobin: 9.6 g/dL — ABNORMAL LOW (ref 12.0–15.0)
Immature Granulocytes: 0 %
Lymphocytes Relative: 16 %
Lymphs Abs: 0.8 10*3/uL (ref 0.7–4.0)
MCH: 36.5 pg — ABNORMAL HIGH (ref 26.0–34.0)
MCHC: 33.7 g/dL (ref 30.0–36.0)
MCV: 108.4 fL — ABNORMAL HIGH (ref 80.0–100.0)
Monocytes Absolute: 0.4 10*3/uL (ref 0.1–1.0)
Monocytes Relative: 7 %
Neutro Abs: 3.6 10*3/uL (ref 1.7–7.7)
Neutrophils Relative %: 74 %
Platelet Count: 171 10*3/uL (ref 150–400)
RBC: 2.63 MIL/uL — ABNORMAL LOW (ref 3.87–5.11)
RDW: 12.7 % (ref 11.5–15.5)
WBC Count: 4.9 10*3/uL (ref 4.0–10.5)
nRBC: 0 % (ref 0.0–0.2)

## 2023-06-30 LAB — VITAMIN B12: Vitamin B-12: 458 pg/mL (ref 180–914)

## 2023-07-01 ENCOUNTER — Telehealth: Payer: Self-pay | Admitting: Cardiology

## 2023-07-01 ENCOUNTER — Encounter: Payer: Self-pay | Admitting: Pulmonary Disease

## 2023-07-01 DIAGNOSIS — L308 Other specified dermatitis: Secondary | ICD-10-CM | POA: Diagnosis not present

## 2023-07-01 NOTE — Telephone Encounter (Signed)
Pt c/o medication issue:  1. Name of Medication:furosemide (LASIX) 20 MG tablet    2. How are you currently taking this medication (dosage and times per day)?   3. Are you having a reaction (difficulty breathing--STAT)? Yes.   4. What is your medication issue? Patient states that when the medication was increased to 40 mg per day, she started itching more. Requesting call back to discuss.

## 2023-07-01 NOTE — Telephone Encounter (Signed)
Patient states itching since increasing her Lasix to 40 mg.  States bout arms and her face is starting to itch.  She states she only took one today but still itching.  She has used oatmeal but helped only slightly.  She saw dermatology today and they think possibly reaction to a med. She states lasix is the only new med she has started. She is asking what to do regarding the medication Advised to take one lasix in the morning until we hear from provider, since she did OK on lesser dose this might help calm things some.  Also to take Benadryl tonight before bed.

## 2023-07-01 NOTE — Telephone Encounter (Signed)
Patient states that she will just reach out to the doctor who prescribed this medication for her and check with them

## 2023-07-02 ENCOUNTER — Ambulatory Visit: Payer: Medicare PPO | Admitting: Podiatry

## 2023-07-02 ENCOUNTER — Encounter: Payer: Self-pay | Admitting: Podiatry

## 2023-07-02 DIAGNOSIS — M2170 Unequal limb length (acquired), unspecified site: Secondary | ICD-10-CM

## 2023-07-02 LAB — METHYLMALONIC ACID, SERUM: Methylmalonic Acid, Quantitative: 564 nmol/L — ABNORMAL HIGH (ref 0–378)

## 2023-07-02 NOTE — Telephone Encounter (Signed)
Would recommend decreasing her Lasix to 20 mg daily and she can take an additional 20 mg daily as needed for lower extremity swelling, shortness of breath or weight gain of 2-3 lbs overnight. Please have her continue to monitor daily weight and notify office for worsening swelling, weight gain of 2-3 lbs overnight or 5 lbs in a week, or shortness of breath. Please notify us if rash does not improve.

## 2023-07-02 NOTE — Telephone Encounter (Signed)
Call to patient , rings but no VM picked up

## 2023-07-03 ENCOUNTER — Encounter: Payer: Self-pay | Admitting: Physician Assistant

## 2023-07-03 ENCOUNTER — Encounter (HOSPITAL_COMMUNITY): Payer: Self-pay

## 2023-07-03 ENCOUNTER — Telehealth: Payer: Self-pay | Admitting: Physician Assistant

## 2023-07-03 DIAGNOSIS — I509 Heart failure, unspecified: Secondary | ICD-10-CM | POA: Diagnosis not present

## 2023-07-03 LAB — COPPER, SERUM: Copper: 98 ug/dL (ref 80–158)

## 2023-07-03 NOTE — Telephone Encounter (Signed)
Pt aware of recommendations and pt on own decreased to 20 mg of Lasix and itching has improved Pt will monitor and knows to take Lasix 20 mg every day may take an additional 20 mg for swelling SOB  weight gain of 2-3 lbs overnight or 5 lbs in a week ./cy

## 2023-07-03 NOTE — Progress Notes (Signed)
[facility] [phone]   [phone]  PROGRESS NOTE  Patient Care Team: [name], MD as PCP - General (Internal Medicine) Jens Som Madolyn Frieze, MD as PCP - Cardiology (Cardiology)  CHIEF COMPLAINTS/PURPOSE OF CONSULTATION:  Macrocytic anemia  Vitamin B12 deficiency  HISTORY OF PRESENTING ILLNESS:  On review of the previous records, there is evidence of chronic anemia for several years stable between 10-11. Most recent labs from 2/29/204 showed Hgb had declined to 9.8, MCV 100.3. No other cytopenias. Iron panel shows no evidence of iron deficiency.  She has received periodic iron infusions, most recently received IV feraheme 510 mg x 2 doses in October 2023. She was last given 2 units of PRBC in December 2023 after being hospitalized for fracture of left femur which required intramedullary nailing with ortho. Lastly, she receives retracrit 10,000 units every 4 weeks, last given on 12/30/2022.   INTERVAL HISTORY: Paula Ward 87 y.o. female returns for a follow up for macrocytic anemia.  She is accompanied by her husband for this visit.  She was last seen on 03/25/2023 and in the interim, she has continued on vitamin B12 sublingual supplementation.   On exam today, Paula Ward reports that her energy levels are overall stable. She does have fatigue but is able to complete her baseline ADLs on her own. She denies any signs of bleeding such as hematochezia or melena. She is taking vitamin B12 supplementation every other day. Her shortness of breath is unchanged and she continues on supplemental oxygen as needed. She denies fevers, chills, sweats, chest pain or cough. She has no other complaints. Rest of the 10 ROS is below.   MEDICAL HISTORY:  Past Medical History:  Diagnosis Date   Anemia    Angiodysplasia of stomach and duodenum    Arthus phenomenon    AVM (arteriovenous malformation)    Blood transfusion without reported diagnosis    last 4  units 12-22-15, Iron infusion x2 last -01-07-16,01-14-16.   Breast cancer (HCC) 1989   Left   Candida esophagitis (HCC)    Cataract    Chronic kidney disease    Chronic mild renal insuffiency   CREST syndrome (HCC)    GERD (gastroesophageal reflux disease)    w/ HH   Interstitial lung disease (HCC)    Nodule of kidney    Pericardial effusion    PONV (postoperative nausea and vomiting)    Pulmonary embolus (HCC) 2003   Pulmonary hypertension (HCC)    followed by Dr Bland Span at [facility], now Dr. Penni Bombard visit end 2'17.   Rectal incontinence    Renal cell carcinoma (HCC)    Scleroderma (HCC)    Tubular adenoma of colon    Uterine polyp     SURGICAL HISTORY: Past Surgical History:  Procedure Laterality Date   APPENDECTOMY  1962   BREAST LUMPECTOMY  1989   left   CATARACT EXTRACTION, BILATERAL Bilateral 12/2013   COLONOSCOPY WITH PROPOFOL N/A 04/15/2021   Procedure: COLONOSCOPY WITH PROPOFOL;  Surgeon: Rachael Fee, MD;  Location: [facility];  Service: Endoscopy;  Laterality: N/A;   ENTEROSCOPY N/A 01/18/2016   Procedure: ENTEROSCOPY;  Surgeon: Napoleon Form, MD;  Location: [facility];  Service: Endoscopy;  Laterality: N/A;   ENTEROSCOPY N/A 05/24/2018   Procedure: ENTEROSCOPY;  Surgeon: Lynann Bologna, MD;  Location: [facility];  Service: Endoscopy;  Laterality: N/A;   ENTEROSCOPY N/A 03/29/2021   Procedure: ENTEROSCOPY;  Surgeon: Beverley Fiedler, MD;  Location: [facility];  Service: Gastroenterology;  Laterality: N/A;   ENTEROSCOPY N/A 04/13/2021   Procedure: ENTEROSCOPY;  Surgeon: Benancio Deeds, MD;  Location: [facility];  Service: Gastroenterology;  Laterality: N/A;   ESOPHAGOGASTRODUODENOSCOPY (EGD) WITH PROPOFOL N/A 12/21/2015   Procedure: ESOPHAGOGASTRODUODENOSCOPY (EGD) WITH PROPOFOL;  Surgeon: Hilarie Fredrickson, MD;  Location: [facility];  Service: Endoscopy;  Laterality: N/A;   HOT HEMOSTASIS N/A 05/24/2018   Procedure: HOT HEMOSTASIS (ARGON PLASMA  COAGULATION/BICAP);  Surgeon: Lynann Bologna, MD;  Location: [facility];  Service: Endoscopy;  Laterality: N/A;   HOT HEMOSTASIS N/A 03/29/2021   Procedure: HOT HEMOSTASIS (ARGON PLASMA COAGULATION/BICAP);  Surgeon: Beverley Fiedler, MD;  Location: [facility];  Service: Gastroenterology;  Laterality: N/A;   HOT HEMOSTASIS N/A 04/13/2021   Procedure: HOT HEMOSTASIS (ARGON PLASMA COAGULATION/BICAP);  Surgeon: Benancio Deeds, MD;  Location: [facility];  Service: Gastroenterology;  Laterality: N/A;   INTRAMEDULLARY (IM) NAIL INTERTROCHANTERIC Left 10/11/2022   Procedure: INTRAMEDULLARY (IM) NAIL INTERTROCHANTERIC;  Surgeon: Ollen Gross, MD;  Location: [facility];  Service: Orthopedics;  Laterality: Left;   IR GENERIC HISTORICAL  06/05/2016   IR RADIOLOGIST EVAL & MGMT 06/05/2016 [name], MD [facility] INTERV RAD   IVC Filter     KIDNEY SURGERY     left -"laser surgery by Dr. Fredia Sorrow- 5 yrs ago" no removal   RIGHT/LEFT HEART CATH AND CORONARY ANGIOGRAPHY N/A 04/25/2022   Procedure: RIGHT/LEFT HEART CATH AND CORONARY ANGIOGRAPHY;  Surgeon: Tonny Bollman, MD;  Location: [facility] INVASIVE CV LAB;  Service: Cardiovascular;  Laterality: N/A;   SCHLEROTHERAPY  05/24/2018   Procedure: Theresia Majors;  Surgeon: Lynann Bologna, MD;  Location: [facility];  Service: Endoscopy;;   SUBMUCOSAL TATTOO INJECTION  04/13/2021   Procedure: SUBMUCOSAL TATTOO INJECTION;  Surgeon: Benancio Deeds, MD;  Location: [facility];  Service: Gastroenterology;;   TONSILLECTOMY     TUBAL LIGATION      SOCIAL HISTORY: Social History   Socioeconomic History   Marital status: Married    Spouse name: Not on file   Number of children: 2   Years of education: Not on file   Highest education level: Not on file  Occupational History   Occupation: Retired  Tobacco Use   Smoking status: Never   Smokeless tobacco: Never  Vaping Use   Vaping status: Never Used  Substance and Sexual Activity   Alcohol use: No   Drug  use: No   Sexual activity: Not Currently  Other Topics Concern   Not on file  Social History Narrative   Married '611 son- '65, 1 daughter '63; 6 children (2 adopted)SO- SOBRetirement- doing well. Marriage in good health. Patient has never smoked. Alcohol use- noPt gets regular exercise   Social Determinants of Health   Financial Resource Strain: Low Risk  (04/02/2022)   Overall Financial Resource Strain (CARDIA)    Difficulty of Paying Living Expenses: Not hard at all  Food Insecurity: No Food Insecurity (06/09/2023)   Hunger Vital Sign    Worried About Running Out of Food in the Last Year: Never true    Ran Out of Food in the Last Year: Never true  Transportation Needs: No Transportation Needs (06/09/2023)   PRAPARE - Administrator, Civil Service (Medical): No    Lack of Transportation (Non-Medical): No  Physical Activity: Inactive (04/02/2022)   Exercise Vital Sign    Days of Exercise per Week: 0 days    Minutes of Exercise per Session: 0 min  Stress: No Stress Concern Present (04/02/2022)  [employer] of Occupational Health - Occupational Stress Questionnaire    Feeling of Stress : Not at all  Social Connections: Socially Integrated (04/02/2022)   Social Connection and Isolation Panel [NHANES]    Frequency of Communication with Friends and Family: More than three times a week    Frequency of Social Gatherings with Friends and Family: More than three times a week    Attends Religious Services: More than 4 times per year    Active Member of [employer] or Organizations: Yes    Attends Engineer, structural: More than 4 times per year    Marital Status: Married  Catering manager Violence: Not At Risk (06/04/2023)   Humiliation, Afraid, Rape, and Kick questionnaire    Fear of Current or Ex-Partner: No    Emotionally Abused: No    Physically Abused: No    Sexually Abused: No    FAMILY HISTORY: Family History  Problem Relation Age of Onset   Bladder Cancer  Father    Diabetes Father    Prostate cancer Father    Alzheimer's disease Mother    Diabetes Sister    Lung cancer Sister         smoker   Esophageal cancer Paternal Uncle    Colon cancer Neg Hx     ALLERGIES:  is allergic to codeine, other, erythromycin, iodinated contrast media, lisinopril, mycophenolate mofetil, and sulfa antibiotics.  MEDICATIONS:  Current Outpatient Medications  Medication Sig Dispense Refill   acetaminophen (TYLENOL) 325 MG tablet Take 2 tablets (650 mg total) by mouth every 6 (six) hours as needed for moderate pain. 30 tablet 0   albuterol (VENTOLIN HFA) 108 (90 Base) MCG/ACT inhaler INHALE 2 PUFFS INTO THE LUNGS EVERY 6 HOURS AS NEEDED FOR WHEEZING OR SHORTNESS OF BREATH 6.7 g 3   clobetasol ointment (TEMOVATE) 0.05 % Apply 1 Application topically 2 (two) times daily.     Cyanocobalamin (VITAMIN B-12) 1000 MCG SUBL Place 1,000 mcg under the tongue. Taking twice a week     docusate sodium (COLACE) 100 MG capsule Take 1 capsule (100 mg total) by mouth 2 (two) times daily. (Patient taking differently: Take 100 mg by mouth 2 (two) times daily. Pt takes every morning) 10 capsule 0   famotidine (PEPCID) 20 MG tablet Take 1 tablet (20 mg total) by mouth at bedtime. 30 tablet 11   fluticasone (FLONASE) 50 MCG/ACT nasal spray Place 1 spray into both nostrils daily as needed for allergies. (Patient taking differently: Place 1 spray into both nostrils in the morning and at bedtime.) 48 g 0   furosemide (LASIX) 20 MG tablet Take 2 tablets (40 mg total) by mouth daily. 90 tablet 1   guaiFENesin (MUCINEX) 600 MG 12 hr tablet Take 300 mg by mouth daily.     ipratropium (ATROVENT) 0.03 % nasal spray Place 2 sprays into both nostrils every 12 (twelve) hours. 30 mL 12   latanoprost (XALATAN) 0.005 % ophthalmic solution Place 1 drop into both eyes at bedtime.     magic mouthwash SOLN Take 10 mLs by mouth 4 (four) times daily as needed for mouth pain. 15 mL 2   mirtazapine (REMERON)  15 MG tablet Take 1 tablet (15 mg total) by mouth at bedtime. 30 tablet 3   montelukast (SINGULAIR) 10 MG tablet TAKE 1 TABLET BY MOUTH AT BEDTIME 90 tablet 0   Multiple Vitamin (MULTIVITAMIN) capsule Take 1 capsule by mouth daily.     mupirocin ointment (BACTROBAN) 2 % Apply 1  Application topically 2 (two) times daily as needed (dry skin).     OPSUMIT 10 MG TABS Take 10 mg by mouth daily.     OXYGEN Inhale into the lungs. 2 LMP at night and upon exertion     pantoprazole (PROTONIX) 40 MG tablet Take 1 tablet (40 mg total) by mouth daily. 30 tablet 11   polyethylene glycol (MIRALAX / GLYCOLAX) 17 g packet Take 17 g by mouth daily. 14 each 0   spironolactone (ALDACTONE) 25 MG tablet Take 0.5 tablets (12.5 mg total) by mouth daily. 45 tablet 3   sucralfate (CARAFATE) 1 GM/10ML suspension TAKE 10 MLS BY MOUTH EVERY 6 HOURS AS NEEDED FOR REFLUX 420 mL 0   No current facility-administered medications for this visit.    REVIEW OF SYSTEMS:   Constitutional: ( - ) fevers, ( - )  chills , ( - ) night sweats Eyes: ( - ) blurriness of vision, ( - ) double vision, ( - ) watery eyes Ears, nose, mouth, throat, and face: ( - ) mucositis, ( - ) sore throat Respiratory: ( - ) cough, ( + ) dyspnea, ( - ) wheezes Cardiovascular: ( - ) palpitation, ( - ) chest discomfort, ( - ) lower extremity swelling Gastrointestinal:  ( - ) nausea, ( - ) heartburn, ( - ) change in bowel habits Skin: ( - ) abnormal skin rashes Lymphatics: ( - ) new lymphadenopathy, ( - ) easy bruising Neurological: ( - ) numbness, ( - ) tingling, ( - ) new weaknesses Behavioral/Psych: ( - ) mood change, ( - ) new changes  All other systems were reviewed with the patient and are negative.  PHYSICAL EXAMINATION: ECOG PERFORMANCE STATUS: 1 - Symptomatic but completely ambulatory  Vitals:   06/30/23 1501  BP: (!) 121/40  Pulse: 82  Resp: 16  Temp: 97.7 F (36.5 C)  SpO2: 98%   Filed Weights   06/30/23 1501  Weight: 106 lb 4.8 oz  (48.2 kg)    GENERAL: well appearing elderly female in NAD  SKIN: skin color, texture, turgor are normal, no rashes or significant lesions EYES: conjunctiva are pink and non-injected, sclera clear LUNGS: clear to auscultation and percussion with normal breathing effort HEART: regular rate & rhythm and no murmurs and no lower extremity edema Musculoskeletal: no cyanosis of digits and no clubbing  PSYCH: alert & oriented x 3, fluent speech NEURO: no focal motor/sensory deficits  LABORATORY DATA:  I have reviewed the data as listed    Latest Ref Rng & Units 06/30/2023    2:35 PM 06/16/2023    1:23 PM 06/04/2023    5:13 PM  CBC  WBC 4.0 - 10.5 K/uL 4.9  5.2  6.1   Hemoglobin 12.0 - 15.0 g/dL 9.6  9.1  9.4   Hematocrit 36.0 - 46.0 % 28.5  27.8  30.0   Platelets 150 - 400 K/uL 171  211.0  172        Latest Ref Rng & Units 06/30/2023    2:35 PM 06/16/2023    1:23 PM 06/12/2023   11:35 AM  CMP  Glucose 70 - 99 mg/dL 098  94  88   BUN 8 - 23 mg/dL 37  44  44   Creatinine 0.44 - 1.00 mg/dL 1.19  1.47  8.29   Sodium 135 - 145 mmol/L 134  134  139   Potassium 3.5 - 5.1 mmol/L 4.4  4.3  4.3   Chloride 98 - 111 mmol/L  95  94  96   CO2 22 - 32 mmol/L 33  33  29   Calcium 8.9 - 10.3 mg/dL 9.2  9.3  9.2   Total Protein 6.5 - 8.1 g/dL 6.6  6.4    Total Bilirubin 0.3 - 1.2 mg/dL 0.7  0.6    Alkaline Phos 38 - 126 U/L 66  61    AST 15 - 41 U/L 20  18    ALT 0 - 44 U/L 7  9     RADIOGRAPHIC STUDIES: I have personally reviewed the radiological images as listed and agreed with the findings in the report. ECHOCARDIOGRAM COMPLETE  Result Date: 06/05/2023    ECHOCARDIOGRAM REPORT   Patient Name:   Paula Ward Date of Exam: 06/05/2023 Medical Rec #:  [id number]       Height:       63.0 in Accession #:    [id number]      Weight:       106.9 lb Date of Birth:  15-Aug-1936       BSA:          1.482 m Patient Age:    86 years        BP:           136/66 mmHg Patient Gender: F               HR:            88 bpm. Exam Location:  Inpatient Procedure: 2D Echo and Color Doppler Indications:    CHF- Acute Diastolic  History:        Patient has prior history of Echocardiogram examinations, most                 recent 01/15/2023. Pericardial Effusion, Pulmonary HTN; Risk                 Factors:CKD. Pulmonary Emblous.  Sonographer:    Raeford Razor RDCS Referring Phys: [id number] VASUNDHRA RATHORE IMPRESSIONS  1. Left ventricular ejection fraction, by estimation, is 70 to 75%. The left ventricle has hyperdynamic function. The left ventricle has no regional wall motion abnormalities. There is moderate concentric left ventricular hypertrophy. Indeterminate diastolic filling due to E-A fusion.  2. Right ventricular systolic function is mildly reduced. The right ventricular size is normal.  3. Left atrial size was severely dilated.  4. Large pericardial effusion surrounds heart, mainly posteriorly and lateral to LV. [place]unak dimension is 5.6 cm posteriorly. It has increased in size from echo done in June 2024. Does not meet criteria for tamponade by echo criteria. Clinical correlation indicated .Marland Kitchen Large pericardial effusion.  5. MV annulus is severely calcified. MV is thickened with restricted motion. Difficult to see well Mean gradient through the MV is 8 mm Hg. MVA (Pt1/2 ) 3 cm2. Compared to echo report from June 2024, mean gradient is incresaed (5 to 8 mm Hg) Consider TEE to evaluate further.. Trivial mitral valve regurgitation.  6. AV is thickened, calcified with restricted motion. Peak and mean gradients through the valve are 59 and 37 mm Hg respectively. Dimensionless index is 0.33 consistent with moderate AS.Marland Kitchen The aortic valve is abnormal. Aortic valve regurgitation is not visualized.  7. The inferior vena cava is normal in size with <50% respiratory variability, suggesting right atrial pressure of 8 mmHg. FINDINGS  Left Ventricle: Left ventricular ejection fraction, by estimation, is 70 to 75%. The left ventricle has  hyperdynamic function. The left ventricle has no regional  wall motion abnormalities. The left ventricular internal cavity size was small. There is moderate concentric left ventricular hypertrophy. Indeterminate diastolic filling due to E-A fusion. Right Ventricle: The right ventricular size is normal. Right vetricular wall thickness was not assessed. Right ventricular systolic function is mildly reduced. Left Atrium: Left atrial size was severely dilated. Right Atrium: Right atrial size was normal in size. Pericardium: Large pericardial effusion surrounds heart, mainly posteriorly and lateral to LV. [place]axunak dimension is 5.6 cm posteriorly. It has increased in size from echo done in June 2024. Does not meet criteria for tamponade by echo criteria. Clinical correlation indicated. A large pericardial effusion is present. Mitral Valve: MV annulus is severely calcified. MV is thickened with restricted motion. Difficult to see well Mean gradient through the MV is 8 mm Hg. MVA (Pt1/2 ) 3 cm2. Compared to echo report from June 2024, mean gradient is incresaed (5 to 8 mm Hg) Consider TEE to evaluate further. Trivial mitral valve regurgitation. MV peak gradient, 13.2 mmHg. The mean mitral valve gradient is 8.0 mmHg. Tricuspid Valve: The tricuspid valve is normal in structure. Tricuspid valve regurgitation is mild. Aortic Valve: AV is thickened, calcified with restricted motion. Peak and mean gradients through the valve are 59 and 37 mm Hg respectively. Dimensionless index is 0.33 consistent with moderate AS. The aortic valve is abnormal. Aortic valve regurgitation  is not visualized. Aortic valve mean gradient measures 37.0 mmHg. Aortic valve peak gradient measures 58.7 mmHg. Aortic valve area, by VTI measures 0.95 cm. Pulmonic Valve: The pulmonic valve was not well visualized. Pulmonic valve regurgitation is mild. Aorta: The aortic root and ascending aorta are structurally normal, with no evidence of dilitation. Venous: The  inferior vena cava is normal in size with less than 50% respiratory variability, suggesting right atrial pressure of 8 mmHg. IAS/Shunts: No atrial level shunt detected by color flow Doppler.  LEFT VENTRICLE PLAX 2D LVIDd:         3.50 cm   Diastology LVIDs:         2.00 cm   LV e' medial:    8.21 cm/s LV PW:         1.30 cm   LV E/e' medial:  13.4 LV IVS:        1.40 cm   LV e' lateral:   6.74 cm/s LVOT diam:     1.90 cm   LV E/e' lateral: 16.3 LV SV:         70 LV SV Index:   47 LVOT Area:     2.84 cm  RIGHT VENTRICLE RV Basal diam:  1.90 cm RV S prime:     18.60 cm/s TAPSE (M-mode): 2.2 cm LEFT ATRIUM              Index        RIGHT ATRIUM           Index LA diam:        3.80 cm  2.56 cm/m   RA Area:     14.90 cm LA Vol (A2C):   107.0 ml 72.21 ml/m  RA Volume:   36.20 ml  24.43 ml/m LA Vol (A4C):   75.0 ml  50.61 ml/m LA Biplane Vol: 92.1 ml  62.15 ml/m  AORTIC VALVE AV Area (Vmax):    1.07 cm AV Area (Vmean):   0.94 cm AV Area (VTI):     0.95 cm AV Vmax:           383.00 cm/s AV  Vmean:          290.000 cm/s AV VTI:            0.740 m AV Peak Grad:      58.7 mmHg AV Mean Grad:      37.0 mmHg LVOT Vmax:         144.00 cm/s LVOT Vmean:        96.500 cm/s LVOT VTI:          0.247 m LVOT/AV VTI ratio: 0.33  AORTA Ao Asc diam: 3.20 cm MITRAL VALVE                TRICUSPID VALVE MV Area (PHT): 1.59 cm     TR Peak grad:   58.7 mmHg MV Peak grad:  13.2 mmHg    TR Vmax:        383.00 cm/s MV Mean grad:  8.0 mmHg MV Vmax:       1.82 m/s     SHUNTS MV Vmean:      139.0 cm/s   Systemic VTI:  0.25 m MV PHT:        71.45 msec   Systemic Diam: 1.90 cm MV Decel Time: 182 msec MV E velocity: 110.00 cm/s MV A velocity: 147.00 cm/s MV E/A ratio:  0.75 Dietrich Pates MD Electronically signed by Dietrich Pates MD Signature Date/Time: 06/05/2023/6:21:35 PM    Final    DG Chest 2 View  Result Date: 06/04/2023 CLINICAL DATA:  Shortness of breath, chest tightness EXAM: CHEST - 2 VIEW COMPARISON:  Chest radiograph dated 05/13/2023.  CT chest dated 05/13/2022 FINDINGS: Stable cardiomegaly. Pericardial effusion is suspected, as noted on prior CT. Chronic additional markings, likely reflecting chronic interstitial lung disease when correlating with prior CT. No frank interstitial edema. No definite pleural effusions.  No pneumothorax. Surgical clips along the left chest wall/axilla. IMPRESSION: Stable cardiomegaly with suspected pericardial effusion. Electronically Signed   By: Charline Bills M.D.   On: 06/04/2023 18:51    ASSESSMENT & PLAN Paula Ward is a 87 y.o. female who presents to the hematology clinic for evaluation of macrocytic anemia  #Macrocytic anemia: #Vitamin B12 deficiency: --Etiology unknown but chronic in nature. CKD is part of differential.  --Iron panel from 12/30/2022 reviewed without deficiency.  --Patient has retacrit 10,000 units q 4 weeks, last received 12/30/2022. --Workup from 02/12/2023 didn't reveal hemolysis, paraproteinemia, folate deficiency. There was evidence of vitamin B12 deficiency so patient is currently on vitamin B12 SL replacement every other day.   --Labs today show persistent anemia with Hgb 9.6, MCV 108.4. Vitamin B12 level is 458 and MMA is elevated to 564.  --Recommend to try weekly B12 injection x 2 weeks and then repeat labs. If no improvement of anemia, will proceed with bone marrow biopsy. --RTC one week after bone marrow biopsy   No orders of the defined types were placed in this encounter.   All questions were answered. The patient knows to call the clinic with any problems, questions or concerns.  I have spent a total of 30 minutes minutes of face-to-face and non-face-to-face time, preparing to see the patient, obtaining and/or reviewing separately obtained history, performing a medically appropriate examination, counseling and educating the patient, ordering tests/procedures,documenting clinical information in the electronic health record, and care coordination.    Georga Kaufmann, PA-C Department of Hematology/Oncology [facility] Phone: [phone]

## 2023-07-05 NOTE — Progress Notes (Signed)
Subjective:  Patient ID: Paula Ward, female    DOB: 1936/09/22,  MRN: [id number] HPI Chief Complaint  Patient presents with   Foot Orthotics    Patient states before Christmas last year she fell and broke left femur, says her left leg is shorter now and would like some kind of insert to level her out   New Patient (Initial Visit)    87 y.o. female presents with the above complaint.   ROS: Denies fever chills nausea vomiting muscle aches pains calf pain back pain chest pain shortness of breath.  Past Medical History:  Diagnosis Date   Anemia    Angiodysplasia of stomach and duodenum    Arthus phenomenon    AVM (arteriovenous malformation)    Blood transfusion without reported diagnosis    last 4 units 12-22-15, Iron infusion x2 last -01-07-16,01-14-16.   Breast cancer (HCC) 1989   Left   Candida esophagitis (HCC)    Cataract    Chronic kidney disease    Chronic mild renal insuffiency   CREST syndrome (HCC)    GERD (gastroesophageal reflux disease)    w/ HH   Interstitial lung disease (HCC)    Nodule of kidney    Pericardial effusion    PONV (postoperative nausea and vomiting)    Pulmonary embolus (HCC) 2003   Pulmonary hypertension (HCC)    followed by Dr Bland Span at [facility], now Dr. Penni Bombard visit end 2'17.   Rectal incontinence    Renal cell carcinoma (HCC)    Scleroderma (HCC)    Tubular adenoma of colon    Uterine polyp    Past Surgical History:  Procedure Laterality Date   APPENDECTOMY  1962   BREAST LUMPECTOMY  1989   left   CATARACT EXTRACTION, BILATERAL Bilateral 12/2013   COLONOSCOPY WITH PROPOFOL N/A 04/15/2021   Procedure: COLONOSCOPY WITH PROPOFOL;  Surgeon: Rachael Fee, MD;  Location: [facility];  Service: Endoscopy;  Laterality: N/A;   ENTEROSCOPY N/A 01/18/2016   Procedure: ENTEROSCOPY;  Surgeon: Napoleon Form, MD;  Location: [facility];  Service: Endoscopy;  Laterality: N/A;   ENTEROSCOPY N/A 05/24/2018   Procedure: ENTEROSCOPY;   Surgeon: Lynann Bologna, MD;  Location: [facility];  Service: Endoscopy;  Laterality: N/A;   ENTEROSCOPY N/A 03/29/2021   Procedure: ENTEROSCOPY;  Surgeon: Beverley Fiedler, MD;  Location: [facility];  Service: Gastroenterology;  Laterality: N/A;   ENTEROSCOPY N/A 04/13/2021   Procedure: ENTEROSCOPY;  Surgeon: Benancio Deeds, MD;  Location: [facility];  Service: Gastroenterology;  Laterality: N/A;   ESOPHAGOGASTRODUODENOSCOPY (EGD) WITH PROPOFOL N/A 12/21/2015   Procedure: ESOPHAGOGASTRODUODENOSCOPY (EGD) WITH PROPOFOL;  Surgeon: Hilarie Fredrickson, MD;  Location: [facility];  Service: Endoscopy;  Laterality: N/A;   HOT HEMOSTASIS N/A 05/24/2018   Procedure: HOT HEMOSTASIS (ARGON PLASMA COAGULATION/BICAP);  Surgeon: Lynann Bologna, MD;  Location: [facility];  Service: Endoscopy;  Laterality: N/A;   HOT HEMOSTASIS N/A 03/29/2021   Procedure: HOT HEMOSTASIS (ARGON PLASMA COAGULATION/BICAP);  Surgeon: Beverley Fiedler, MD;  Location: [facility];  Service: Gastroenterology;  Laterality: N/A;   HOT HEMOSTASIS N/A 04/13/2021   Procedure: HOT HEMOSTASIS (ARGON PLASMA COAGULATION/BICAP);  Surgeon: Benancio Deeds, MD;  Location: [facility];  Service: Gastroenterology;  Laterality: N/A;   INTRAMEDULLARY (IM) NAIL INTERTROCHANTERIC Left 10/11/2022   Procedure: INTRAMEDULLARY (IM) NAIL INTERTROCHANTERIC;  Surgeon: Ollen Gross, MD;  Location: [facility];  Service: Orthopedics;  Laterality: Left;   IR GENERIC HISTORICAL  06/05/2016   IR RADIOLOGIST EVAL & MGMT 06/05/2016  [name], MD [facility] INTERV RAD   IVC Filter     KIDNEY SURGERY     left -"laser surgery by Dr. Fredia Sorrow- 5 yrs ago" no removal   RIGHT/LEFT HEART CATH AND CORONARY ANGIOGRAPHY N/A 04/25/2022   Procedure: RIGHT/LEFT HEART CATH AND CORONARY ANGIOGRAPHY;  Surgeon: Tonny Bollman, MD;  Location: [facility] INVASIVE CV LAB;  Service: Cardiovascular;  Laterality: N/A;   SCHLEROTHERAPY  05/24/2018   Procedure: Theresia Majors;  Surgeon: Lynann Bologna,  MD;  Location: [facility];  Service: Endoscopy;;   SUBMUCOSAL TATTOO INJECTION  04/13/2021   Procedure: SUBMUCOSAL TATTOO INJECTION;  Surgeon: Benancio Deeds, MD;  Location: [facility];  Service: Gastroenterology;;   TONSILLECTOMY     TUBAL LIGATION      Current Outpatient Medications:    acetaminophen (TYLENOL) 325 MG tablet, Take 2 tablets (650 mg total) by mouth every 6 (six) hours as needed for moderate pain., Disp: 30 tablet, Rfl: 0   albuterol (VENTOLIN HFA) 108 (90 Base) MCG/ACT inhaler, INHALE 2 PUFFS INTO THE LUNGS EVERY 6 HOURS AS NEEDED FOR WHEEZING OR SHORTNESS OF BREATH, Disp: 6.7 g, Rfl: 3   clobetasol ointment (TEMOVATE) 0.05 %, Apply 1 Application topically 2 (two) times daily., Disp: , Rfl:    Cyanocobalamin (VITAMIN B-12) 1000 MCG SUBL, Place 1,000 mcg under the tongue. Taking twice a week, Disp: , Rfl:    docusate sodium (COLACE) 100 MG capsule, Take 1 capsule (100 mg total) by mouth 2 (two) times daily. (Patient taking differently: Take 100 mg by mouth 2 (two) times daily. Pt takes every morning), Disp: 10 capsule, Rfl: 0   famotidine (PEPCID) 20 MG tablet, Take 1 tablet (20 mg total) by mouth at bedtime., Disp: 30 tablet, Rfl: 11   fluticasone (FLONASE) 50 MCG/ACT nasal spray, Place 1 spray into both nostrils daily as needed for allergies. (Patient taking differently: Place 1 spray into both nostrils in the morning and at bedtime.), Disp: 48 g, Rfl: 0   furosemide (LASIX) 20 MG tablet, Take 2 tablets (40 mg total) by mouth daily., Disp: 90 tablet, Rfl: 1   guaiFENesin (MUCINEX) 600 MG 12 hr tablet, Take 300 mg by mouth daily., Disp: , Rfl:    ipratropium (ATROVENT) 0.03 % nasal spray, Place 2 sprays into both nostrils every 12 (twelve) hours., Disp: 30 mL, Rfl: 12   latanoprost (XALATAN) 0.005 % ophthalmic solution, Place 1 drop into both eyes at bedtime., Disp: , Rfl:    magic mouthwash SOLN, Take 10 mLs by mouth 4 (four) times daily as needed for mouth pain., Disp:  15 mL, Rfl: 2   mirtazapine (REMERON) 15 MG tablet, Take 1 tablet (15 mg total) by mouth at bedtime., Disp: 30 tablet, Rfl: 3   montelukast (SINGULAIR) 10 MG tablet, TAKE 1 TABLET BY MOUTH AT BEDTIME, Disp: 90 tablet, Rfl: 0   Multiple Vitamin (MULTIVITAMIN) capsule, Take 1 capsule by mouth daily., Disp: , Rfl:    mupirocin ointment (BACTROBAN) 2 %, Apply 1 Application topically 2 (two) times daily as needed (dry skin)., Disp: , Rfl:    OPSUMIT 10 MG TABS, Take 10 mg by mouth daily., Disp: , Rfl:    OXYGEN, Inhale into the lungs. 2 LMP at night and upon exertion, Disp: , Rfl:    pantoprazole (PROTONIX) 40 MG tablet, Take 1 tablet (40 mg total) by mouth daily., Disp: 30 tablet, Rfl: 11   polyethylene glycol (MIRALAX / GLYCOLAX) 17 g packet, Take 17 g by mouth daily., Disp: 14 each, Rfl:  0   spironolactone (ALDACTONE) 25 MG tablet, Take 0.5 tablets (12.5 mg total) by mouth daily., Disp: 45 tablet, Rfl: 3   sucralfate (CARAFATE) 1 GM/10ML suspension, TAKE 10 MLS BY MOUTH EVERY 6 HOURS AS NEEDED FOR REFLUX, Disp: 420 mL, Rfl: 0  Allergies  Allergen Reactions   Codeine Nausea Only    Hallucinations, too   Other Nausea And Vomiting    "-mycin" antibiotics.   Also cause hallucinations.   Erythromycin Nausea And Vomiting   Iodinated Contrast Media Other (See Comments)    Renal issues   Lisinopril     Pt doesn't remember    Mycophenolate Mofetil Nausea Only   Sulfa Antibiotics Other (See Comments)    unknown   Review of Systems Objective:  There were no vitals filed for this visit.  General: Well developed, nourished, in no acute distress, alert and oriented x3   Dermatological: Skin is warm, dry and supple bilateral. Nails x 10 are well maintained; remaining integument appears unremarkable at this time. There are no open sores, no preulcerative lesions, no rash or signs of infection present.  Vascular: Dorsalis Pedis artery and Posterior Tibial artery pedal pulses are 2/4 bilateral with  immedate capillary fill time. Pedal hair growth present. No varicosities and no lower extremity edema present bilateral.   Neruologic: Grossly intact via light touch bilateral. Vibratory intact via tuning fork bilateral. Protective threshold with Semmes Wienstein monofilament intact to all pedal sites bilateral. Patellar and Achilles deep tendon reflexes 2+ bilateral. No Babinski or clonus noted bilateral.   Musculoskeletal: No gross boney pedal deformities bilateral. No pain, crepitus, or limitation noted with foot and ankle range of motion bilateral. Muscular strength 5/5 in all groups tested bilateral.  Short leg left 0.5 inches  Gait: Unassisted, Nonantalgic.    Radiographs:  None taken  Assessment & Plan:   Assessment: Leg length discrepancy short left half-inch  Plan: I discussed and insert versus an exterior shoe raise with her.  Then instructed her to discuss this with our ped orthotist whom I introduced her to.     Tamico Mundo T. [place], [place]

## 2023-07-07 ENCOUNTER — Telehealth: Payer: Self-pay | Admitting: Pulmonary Disease

## 2023-07-07 ENCOUNTER — Encounter: Payer: Self-pay | Admitting: Adult Health

## 2023-07-07 ENCOUNTER — Ambulatory Visit: Payer: Medicare PPO | Admitting: Adult Health

## 2023-07-07 VITALS — BP 130/50 | HR 79 | Temp 97.3°F | Ht 63.0 in | Wt 106.0 lb

## 2023-07-07 DIAGNOSIS — J849 Interstitial pulmonary disease, unspecified: Secondary | ICD-10-CM | POA: Diagnosis not present

## 2023-07-07 DIAGNOSIS — J9611 Chronic respiratory failure with hypoxia: Secondary | ICD-10-CM | POA: Diagnosis not present

## 2023-07-07 DIAGNOSIS — I5031 Acute diastolic (congestive) heart failure: Secondary | ICD-10-CM

## 2023-07-07 DIAGNOSIS — I272 Pulmonary hypertension, unspecified: Secondary | ICD-10-CM | POA: Diagnosis not present

## 2023-07-07 MED ORDER — OPSUMIT 10 MG PO TABS: 10.0000 mg | ORAL_TABLET | Freq: Every day | ORAL | 3 refills | Status: DC

## 2023-07-07 NOTE — Patient Instructions (Addendum)
Continue on Opsumit 10mg  daily  Continue on Lasix and Aldactone  Continue on Oxygen 2l/m At bedtime  Follow up with cardiology as planned  Follow up with Dr. Francine Graven or Barak Bialecki NP 4-6 weeks with Spirometry with DLCO.

## 2023-07-07 NOTE — Assessment & Plan Note (Signed)
Recent admission for decompensated congestive heart failure.  Continue follow-up with cardiology.  Continue on current diuretic regimen

## 2023-07-07 NOTE — Assessment & Plan Note (Signed)
Continue on oxygen 2 L at bedtime.  Goal is to keep O2 saturations greater than 88 to 90%

## 2023-07-07 NOTE — Assessment & Plan Note (Signed)
Pulmonary hypertension related scleroderma-patient is to continue on Opsumit.  Refills were given.  Continue on diuretics with Aldactone and Lasix.  Plan  . Patient Instructions  Continue on Opsumit 10mg  daily  Continue on Lasix and Aldactone  Continue on Oxygen 2l/m At bedtime  Follow up with cardiology as planned  Follow up with Dr. Francine Graven or Michaiah Maiden NP 4-6 weeks with Spirometry with DLCO.

## 2023-07-07 NOTE — Telephone Encounter (Signed)
Please schedule patient's follow up with Dr. Judeth Horn given her history of pulmonary hypertension I think she would be better served under his care.  Thanks, Cletis Athens

## 2023-07-07 NOTE — Assessment & Plan Note (Signed)
ILD related scleroderma-currently not on oxygen at rest.  Uses O2 at bedtime.  Will repeat spirometry with DLCO pending these results if decline would consider repeating a high-res CT chest. Pending these results could decide if antifibrotic therapy would be an option.  Would worry about the side effect profile with her BMI at 18

## 2023-07-07 NOTE — Progress Notes (Signed)
@Patient  ID: Paula Ward, female    DOB: [birth year], 87 y.o.   MRN: [id number]  Chief Complaint  Patient presents with   Follow-up    Referring provider: [name], *  HPI: 87 yo female never smoker followed for pulmonary hypertension and ILD related scleroderma Medical history significant for AVM, anemia , CKD and breast cancer, valvular disease  TEST/EVENTS :  CT Chest 04/23/22 1. Marked interval improvement in the patchy and confluent areas of ground-glass opacity seen previously with resolution in some regions. 2. Dependent consolidative airspace disease seen previously in the lower lobes has largely resolved in the interval. 3. Subpleural reticulation bilaterally is similar, compatible with underlying chronic interstitial lung disease. 4. Similar moderate to large pericardial effusion. 5. Enlargement of the pulmonary outflow tract/main pulmonary arteries suggests pulmonary arterial hypertension. 6. 7 mm right upper lobe pulmonary nodule is stable back to 03/30/2021. 7. Similar left-sided pleural effusion. 8. Aortic Atherosclerosis    CTA Chest 03/17/22 1. No evidence for pulmonary embolism. 2. Large pericardial effusion. 3. Mild cardiomegaly. 4. Small bilateral pleural effusions. 5. Bilateral lower lobe airspace disease compatible with infection. 6. Ground-glass opacities in both lungs favored as edema, although infection is not excluded. Some ground-glass areas are nodular. 7. A follow-up chest CT is recommended in 3 months to re-evaluate and confirm resolution.   CT Chest 03/30/21 Cardiovascular: Large pericardial effusion. Cardiomegaly. Atherosclerotic calcifications of the aorta. Aortic valve calcifications.   Mediastinum/Nodes: Thyroid is unremarkable. No axillary adenopathy. Status post LEFT axillary node sampling. No definitive mediastinal adenopathy.   Lungs/Pleura: Small to moderate LEFT pleural effusion. Trace RIGHT pleural effusion.  Subpleural reticulation bilaterally. Anteriorly located subpleural reticulation of the LEFT breast likely reflecting sequela of radiation related changes. Irregular RIGHT apical pulmonary nodule versus scar measures 6 x 3 mm (series 7, image 27). Several adjacent RIGHT middle lobe pulmonary nodules each measure approximately 4 mm (series 7, image 104). LEFT lower lobe consolidative opacity, nonspecific. RIGHT basilar linear opacity most consistent with atelectasis.   HRCT Chest 04/12/2019 1. Findings suggestive of a probable UIP pattern fibrosis, which appears  similar to slightly progressed since 2012 and is likely related to the  patient's known underlying connective tissue disorder.  2. Small pulmonary nodules measuring up to 6 mm. Recommend 12 month follow  up chest CT to assess stability, per 2017 Fleischner criteria.  3, Large pericardial effusion. This finding is known per clinic note dated  12/23/2018 and has been documented on a recent echo.    PFT:     Latest Ref Rng & Units 02/03/2022   11:01 AM  PFT Results  FVC-Pre L 1.34   FVC-Predicted Pre % 56   Pre FEV1/FVC % % 84   FEV1-Pre L 1.12   FEV1-Predicted Pre % 64   DLCO uncorrected ml/min/mmHg 10.32   DLCO UNC% % 57   DLCO corrected ml/min/mmHg 11.49   DLCO COR %Predicted % 63   DLVA Predicted % 119   TLC L 2.88   TLC % Predicted % 58   RV % Predicted % 62       PFT 02/28/21 FEV1/FVC 72%, FEV 1.03 (57%), FVC 1.42L (59%), DLCO 41%   PFT 03/01/2020 FEV1/FVC 73%, FEV1 1.25L (65%), FVC 1.25L (68%), DLCO 37.3%   08/2020 Six minute walk distance is within the normal predicted range.  Oxygenation during exercise was adequate (SpO2 =>90%) without the use of supplemental O2. At peak exercise, the heart rate indicated cardiovascular maximums were being approached.  Perceived dyspnea at end of exercise was absent or mild.   Labs:   Path:   Echo 02/28/21: NORMAL LEFT VENTRICULAR SYSTOLIC FUNCTION WITH MILD LVH     NORMAL RIGHT VENTRICULAR SYSTOLIC FUNCTION    VALVULAR REGURGITATION: TRIVIAL MR, MILD TR    VALVULAR STENOSIS: MODERATE AS, MILD MS    LARGE PERICARDIAL EFFUSION (See above)    Heart Catheterization: Cardiac catheterization was 02/07/08. It looked quite good with a PAP of 35/11 (mean 22), mean RAP of 3, PVR = 1.7 wood units, cardiac index = 3.69 L/min/m2, PCWP of 11. Cardiac catheterization in September 2004 prior to therapy revealed an RA pressure was 5, PA pressure 60/15 with a mean of 35, cardiac index 3.2, and PVR 5. The next cath on 10/03/2003, which was performed on iloprost / placebo, revealed a RA pressure of 2, PA pressure of 55/17 with a mean of 32, PCWP of 5, cardiac index of 3.1-3.7 and PVR between 3 and 4.   07/07/2023 Follow up : ILD and PAH related ILD, Post Hospital follow up  Patient presents for a 1 month follow up.  Patient is previously been followed at [facility] for her pulmonary hypertension and interstitial lung disease related to scleroderma.  She has been on Opsumit for many years.  She was recently hospitalized last month for decompensated congestive heart failure.  Patient had had progressive shortness of breath and increased lower extremity edema.  Her BNP was elevated around 2680.  2D echo during admission showed an increase in her chronic pericardial effusion no evidence of tamponade.  She was seen by cardiology and improved with IV diuresis.  She was discharged on Lasix 40 mg and spironolactone 12.5 mg.  She does have chronic kidney disease. Since discharge patient says she is doing okay.  She is some better but continues to get short of breath easily.  She did cut back on her Lasix to 20 mg but says that she is going to restart 40 mg since her leg swelling has gotten worse on the lower dose.  She does need a refill of her Opsumit She denies any hemoptysis, chest pain. Remains on oxygen 2 L at bedtime.    Allergies  Allergen Reactions   Codeine Nausea Only     Hallucinations, too   Other Nausea And Vomiting    "-mycin" antibiotics.   Also cause hallucinations.   Erythromycin Nausea And Vomiting   Iodinated Contrast Media Other (See Comments)    Renal issues   Lisinopril     Pt doesn't remember    Mycophenolate Mofetil Nausea Only   Sulfa Antibiotics Other (See Comments)    unknown    Immunization History  Administered Date(s) Administered   Fluad Quad(high Dose 65+) 07/24/2020   Influenza, High Dose Seasonal PF 07/07/2017, 06/21/2019   Influenza-Unspecified 07/15/2013, 02/16/2015, 07/19/2015, 07/18/2016, 08/20/2018, 06/24/2021, 08/03/2022   Moderna Sars-Covid-2 Vaccination 11/01/2019, 11/29/2019, 09/14/2020, 02/16/2021   Pneumococcal Conjugate-13 01/30/2014   Pneumococcal Polysaccharide-23 08/30/2007   Respiratory Syncytial Virus Vaccine,Recomb Aduvanted(Arexvy) 09/24/2022   Tdap 06/26/2011, 06/07/2018   Zoster, Live 08/15/2014    Past Medical History:  Diagnosis Date   Anemia    Angiodysplasia of stomach and duodenum    Arthus phenomenon    AVM (arteriovenous malformation)    Blood transfusion without reported diagnosis    last 4 units 12-22-15, Iron infusion x2 last -01-07-16,01-14-16.   Breast cancer (HCC) 1989   Left   Candida esophagitis (HCC)    Cataract  Chronic kidney disease    Chronic mild renal insuffiency   CREST syndrome (HCC)    GERD (gastroesophageal reflux disease)    w/ HH   Interstitial lung disease (HCC)    Nodule of kidney    Pericardial effusion    PONV (postoperative nausea and vomiting)    Pulmonary embolus (HCC) 2003   Pulmonary hypertension (HCC)    followed by Dr Bland Span at [facility], now Dr. Penni Bombard visit end 2'17.   Rectal incontinence    Renal cell carcinoma (HCC)    Scleroderma (HCC)    Tubular adenoma of colon    Uterine polyp     Tobacco History: Social History   Tobacco Use  Smoking Status Never  Smokeless Tobacco Never   Counseling given: Not Answered   Outpatient  Medications Prior to Visit  Medication Sig Dispense Refill   acetaminophen (TYLENOL) 325 MG tablet Take 2 tablets (650 mg total) by mouth every 6 (six) hours as needed for moderate pain. 30 tablet 0   albuterol (VENTOLIN HFA) 108 (90 Base) MCG/ACT inhaler INHALE 2 PUFFS INTO THE LUNGS EVERY 6 HOURS AS NEEDED FOR WHEEZING OR SHORTNESS OF BREATH 6.7 g 3   clobetasol ointment (TEMOVATE) 0.05 % Apply 1 Application topically 2 (two) times daily.     Cyanocobalamin (VITAMIN B-12) 1000 MCG SUBL Place 1,000 mcg under the tongue. Taking twice a week     docusate sodium (COLACE) 100 MG capsule Take 1 capsule (100 mg total) by mouth 2 (two) times daily. (Patient taking differently: Take 100 mg by mouth 2 (two) times daily. Pt takes every morning) 10 capsule 0   famotidine (PEPCID) 20 MG tablet Take 1 tablet (20 mg total) by mouth at bedtime. 30 tablet 11   fluticasone (FLONASE) 50 MCG/ACT nasal spray Place 1 spray into both nostrils daily as needed for allergies. (Patient taking differently: Place 1 spray into both nostrils in the morning and at bedtime.) 48 g 0   furosemide (LASIX) 20 MG tablet Take 2 tablets (40 mg total) by mouth daily. 90 tablet 1   guaiFENesin (MUCINEX) 600 MG 12 hr tablet Take 300 mg by mouth daily.     ipratropium (ATROVENT) 0.03 % nasal spray Place 2 sprays into both nostrils every 12 (twelve) hours. 30 mL 12   latanoprost (XALATAN) 0.005 % ophthalmic solution Place 1 drop into both eyes at bedtime.     magic mouthwash SOLN Take 10 mLs by mouth 4 (four) times daily as needed for mouth pain. 15 mL 2   mirtazapine (REMERON) 15 MG tablet Take 1 tablet (15 mg total) by mouth at bedtime. 30 tablet 3   montelukast (SINGULAIR) 10 MG tablet TAKE 1 TABLET BY MOUTH AT BEDTIME 90 tablet 0   Multiple Vitamin (MULTIVITAMIN) capsule Take 1 capsule by mouth daily.     mupirocin ointment (BACTROBAN) 2 % Apply 1 Application topically 2 (two) times daily as needed (dry skin).     OPSUMIT 10 MG TABS  Take 10 mg by mouth daily.     OXYGEN Inhale into the lungs. 2 LMP at night and upon exertion     pantoprazole (PROTONIX) 40 MG tablet Take 1 tablet (40 mg total) by mouth daily. 30 tablet 11   polyethylene glycol (MIRALAX / GLYCOLAX) 17 g packet Take 17 g by mouth daily. 14 each 0   spironolactone (ALDACTONE) 25 MG tablet Take 0.5 tablets (12.5 mg total) by mouth daily. 45 tablet 3   sucralfate (CARAFATE) 1 GM/10ML  suspension TAKE 10 MLS BY MOUTH EVERY 6 HOURS AS NEEDED FOR REFLUX 420 mL 0   No facility-administered medications prior to visit.     Review of Systems:   Constitutional:   No  weight loss, night sweats,  Fevers, chills,  +fatigue, or  lassitude.  HEENT:   No headaches,  Difficulty swallowing,  Tooth/dental problems, or  Sore throat,                No sneezing, itching, ear ache, nasal congestion, post nasal drip,   CV:  No chest pain,  Orthopnea, PND, +swelling in lower extremities, anasarca, dizziness, palpitations, syncope.   GI  No heartburn, indigestion, abdominal pain, nausea, vomiting, diarrhea, change in bowel habits, loss of appetite, bloody stools.   Resp:   No chest wall deformity  Skin: no rash or lesions.  GU: no dysuria, change in color of urine, no urgency or frequency.  No flank pain, no hematuria   MS:  No joint pain or swelling.  No decreased range of motion.  No back pain.    Physical Exam  BP (!) 130/50 (BP Location: Left Arm, Patient Position: Sitting, Cuff Size: Normal)   Pulse 79   Temp (!) 97.3 F (36.3 C) (Oral)   Ht 5\' 3"  (1.6 m)   Wt 106 lb (48.1 kg)   SpO2 99%   BMI 18.78 kg/m   GEN: A/Ox3; pleasant , NAD, well nourished    HEENT:  [place]/AT,  , NOSE-clear, THROAT-clear, no lesions, no postnasal drip or exudate noted.   NECK:  Supple w/ fair ROM; no JVD; normal carotid impulses w/o bruits; no thyromegaly or nodules palpated; no lymphadenopathy.    RESP  Clear  P & A; w/o, wheezes/ rales/ or rhonchi. no accessory muscle use, no  dullness to percussion  CARD:  RRR, no m/r/g, + peripheral edema, pulses intact, no cyanosis or clubbing.  GI:   Soft & nt; nml bowel sounds; no organomegaly or masses detected.   Musco: Warm bil, no deformities or joint swelling noted.   Neuro: alert, no focal deficits noted.    Skin: Warm, scattered papules along LE     Lab Results:   BMET   Imaging: No results found.  Administration History     None          Latest Ref Rng & Units 02/03/2022   11:01 AM  PFT Results  FVC-Pre L 1.34   FVC-Predicted Pre % 56   Pre FEV1/FVC % % 84   FEV1-Pre L 1.12   FEV1-Predicted Pre % 64   DLCO uncorrected ml/min/mmHg 10.32   DLCO UNC% % 57   DLCO corrected ml/min/mmHg 11.49   DLCO COR %Predicted % 63   DLVA Predicted % 119   TLC L 2.88   TLC % Predicted % 58   RV % Predicted % 62     No results found for: "NITRICOXIDE"      Assessment & Plan:   No problem-specific Assessment & Plan notes found for this encounter.     Rubye Oaks, NP 07/07/2023

## 2023-07-08 ENCOUNTER — Encounter (HOSPITAL_COMMUNITY): Payer: Self-pay

## 2023-07-08 ENCOUNTER — Inpatient Hospital Stay: Payer: Medicare PPO

## 2023-07-08 ENCOUNTER — Encounter: Payer: Self-pay | Admitting: Physician Assistant

## 2023-07-08 DIAGNOSIS — D649 Anemia, unspecified: Secondary | ICD-10-CM | POA: Diagnosis not present

## 2023-07-08 DIAGNOSIS — E538 Deficiency of other specified B group vitamins: Secondary | ICD-10-CM | POA: Diagnosis not present

## 2023-07-08 MED ORDER — CYANOCOBALAMIN 1000 MCG/ML IJ SOLN
1000.0000 ug | Freq: Once | INTRAMUSCULAR | Status: AC
  Administered 2023-07-08: 1000 ug via INTRAMUSCULAR
  Filled 2023-07-08: qty 1

## 2023-07-08 NOTE — Patient Instructions (Signed)
Vitamin B12 Deficiency Vitamin B12 deficiency occurs when the body does not have enough of this important vitamin. The body needs this vitamin: To make red blood cells. To make DNA. This is the genetic material inside cells. To help the nerves work properly so they can carry messages from the brain to the body. Vitamin B12 deficiency can cause health problems, such as not having enough red blood cells in the blood (anemia). This can lead to nerve damage if untreated. What are the causes? This condition may be caused by: Not eating enough foods that contain vitamin B12. Not having enough stomach acid and digestive fluids to properly absorb vitamin B12 from the food that you eat. Having certain diseases that make it hard to absorb vitamin B12. These diseases include Crohn's disease, chronic pancreatitis, and cystic fibrosis. An autoimmune disorder in which the body does not make enough of a protein (intrinsic factor) within the stomach, resulting in not enough absorption of vitamin B12. Having a surgery in which part of the stomach or small intestine is removed. Taking certain medicines that make it hard for the body to absorb vitamin B12. These include: Heartburn medicines, such as antacids and proton pump inhibitors. Some medicines that are used to treat diabetes. What increases the risk? The following factors may make you more likely to develop a vitamin B12 deficiency: Being an older adult. Eating a vegetarian or vegan diet that does not include any foods that come from animals. Eating a poor diet while you are pregnant. Taking certain medicines. Having alcoholism. What are the signs or symptoms? In some cases, there are no symptoms of this condition. If the condition leads to anemia or nerve damage, various symptoms may occur, such as: Weakness. Tiredness (fatigue). Loss of appetite. Numbness or tingling in your hands and feet. Redness and burning of the tongue. Depression,  confusion, or memory problems. Trouble walking. If anemia is severe, symptoms can include: Shortness of breath. Dizziness. Rapid heart rate. How is this diagnosed? This condition may be diagnosed with a blood test to measure the level of vitamin B12 in your blood. You may also have other tests, including: A group of tests that measure certain characteristics of blood cells (complete blood count, CBC). A blood test to measure intrinsic factor. A procedure where a thin tube with a camera on the end is used to look into your stomach or intestines (endoscopy). Other tests may be needed to discover the cause of the deficiency. How is this treated? Treatment for this condition depends on the cause. This condition may be treated by: Changing your eating and drinking habits, such as: Eating more foods that contain vitamin B12. Drinking less alcohol or no alcohol. Getting vitamin B12 injections. Taking vitamin B12 supplements by mouth (orally). Your health care provider will tell you which dose is best for you. Follow these instructions at home: Eating and drinking  Include foods in your diet that come from animals and contain a lot of vitamin B12. These include: Meats and poultry. This includes beef, pork, chicken, [place], and organ meats, such as liver. Seafood. This includes clams, rainbow trout, salmon, tuna, and haddock. Eggs. Dairy foods such as milk, yogurt, and cheese. Eat foods that have vitamin B12 added to them (are fortified), such as ready-to-eat breakfast cereals. Check the label on the package to see if a food is fortified. The items listed above may not be a complete list of foods and beverages you can eat and drink. Contact a dietitian for  more information. Alcohol use Do not drink alcohol if: Your health care provider tells you not to drink. You are pregnant, may be pregnant, or are planning to become pregnant. If you drink alcohol: Limit how much you have to: 0-1 drink a  day for women. 0-2 drinks a day for men. Know how much alcohol is in your drink. In the [place] one drink equals one 12 oz bottle of beer (355 mL), one 5 oz glass of wine (148 mL), or one 1 oz glass of hard liquor (44 mL). General instructions Get vitamin B12 injections if told to by your health care provider. Take supplements only as told by your health care provider. Follow the directions carefully. Keep all follow-up visits. This is important. Contact a health care provider if: Your symptoms come back. Your symptoms get worse or do not improve with treatment. Get help right away: You develop shortness of breath. You have a rapid heart rate. You have chest pain. You become dizzy or you faint. These symptoms may be an emergency. Get help right away. Call 911. Do not wait to see if the symptoms will go away. Do not drive yourself to the hospital. Summary Vitamin B12 deficiency occurs when the body does not have enough of this important vitamin. Common causes include not eating enough foods that contain vitamin B12, not being able to absorb vitamin B12 from the food that you eat, having a surgery in which part of the stomach or small intestine is removed, or taking certain medicines. Eat foods that have vitamin B12 in them. Treatment may include making a change in the way you eat and drink, getting vitamin B12 injections, or taking vitamin B12 supplements. This information is not intended to replace advice given to you by your health care provider. Make sure you discuss any questions you have with your health care provider. Document Revised: 05/31/2021 Document Reviewed: 05/31/2021 Elsevier Patient Education  2024 [employer].

## 2023-07-15 ENCOUNTER — Ambulatory Visit: Payer: Medicare PPO | Admitting: Internal Medicine

## 2023-07-15 ENCOUNTER — Encounter: Payer: Self-pay | Admitting: Internal Medicine

## 2023-07-15 VITALS — BP 124/48 | HR 92 | Ht 63.0 in | Wt 108.0 lb

## 2023-07-15 DIAGNOSIS — J9611 Chronic respiratory failure with hypoxia: Secondary | ICD-10-CM | POA: Diagnosis not present

## 2023-07-15 DIAGNOSIS — J9612 Chronic respiratory failure with hypercapnia: Secondary | ICD-10-CM | POA: Diagnosis not present

## 2023-07-15 DIAGNOSIS — I272 Pulmonary hypertension, unspecified: Secondary | ICD-10-CM | POA: Diagnosis not present

## 2023-07-15 NOTE — Patient Instructions (Addendum)
GERD (REFLUX)  is an extremely common cause of respiratory symptoms just like yours , many times with no obvious heartburn at all.    It can be treated with medication, but also with lifestyle changes including elevation of the head of your bed (ideally with 6 -8inch blocks under the headboard of your bed),  Smoking cessation, avoidance of late meals, excessive alcohol, and avoid fatty foods, chocolate, peppermint, colas, red wine, and acidic juices such as orange juice.  NO MINT OR MENTHOL PRODUCTS SO NO COUGH DROPS  USE SUGARLESS CANDY INSTEAD (Jolley ranchers or Stover's or Life Savers) or even ice chips will also do - the key is to swallow to prevent all throat clearing. NO OIL BASED VITAMINS - use powdered substitutes.  Avoid fish oil when coughing.   Change 02 to 3lpm at bedtime and check your oxygen level if wake up short of breath to be sure it's over 90s   Make sure you check your oxygen saturation  AT  your highest level of activity (not after you stop)   to be sure it stays well  over 90% and adjust  02 flow upward to maintain this level if needed but remember to turn it back to previous settings when you stop (to conserve your supply).   Also  Ok to try albuterol 15 min before an activity (on alternating days)  that you know would usually make you short of breath and see if it makes any difference and if makes none then don't take albuterol after activity unless you can't catch your breath as this means it's the resting that helps, not the albuterol.      Patient aware to use POC @2L  with activity/exertion. Patient aware to contact office if not improving ~1week.

## 2023-07-15 NOTE — Progress Notes (Signed)
Paula Ward, female    DOB: 13-Sep-1936   MRN: [id number]   Brief patient profile:  34  never smoker with sclederma assoc PAH  self referred to pulmonary clinic 07/15/2023 for increased doe x month of sept  2024 assoc with tendency to vol overload already being addressed by Dr Jens Som      History of Present Illness  07/15/2023  Pulmonary/ 1st office eval/Nathanel Tallman on ppi bid and fomitidine  Chief Complaint  Patient presents with   Shortness of Breath    LOV 07/07/23; patient states she is waking every few hours and having to sit up and catch her breath, is using O2 2L [place] [facility]  Dyspnea:  50 ft on RA not checking sats  Cough: min discolored mucus x months  Sleep: bed is flat and started using 3 pillows with pnd sev nights a week but not checking 02 sats  SABA use: twice daily never pre or re challenges  02 2lpm hs x has to sit up and catch her breath - not titrating daytime  Also dealing with mod macroctyic  anemia   No obvious day to day or daytime pattern/variability or assoc   mucus plugs or hemoptysis or cp or chest tightness, subjective wheeze or overt sinus or hb symptoms.    Also denies any obvious fluctuation of symptoms with weather or environmental changes or other aggravating or alleviating factors except as outlined above   No unusual exposure hx or h/o childhood pna/ asthma or knowledge of premature birth.  Current Allergies, Complete Past Medical History, Past Surgical History, Family History, and Social History were reviewed in [employer] record.  ROS  The following are not active complaints unless bolded Hoarseness, sore throat, dysphagia, dental problems, itching, sneezing,  nasal congestion or discharge of excess mucus or purulent secretions, ear ache,   fever, chills, sweats, unintended wt loss or wt gain, classically pleuritic or exertional cp,  orthopnea pnd or arm/hand swelling  or leg swelling, presyncope, palpitations, abdominal pain,  anorexia, nausea, vomiting, diarrhea  or change in bowel habits or change in bladder habits, change in stools or change in urine, dysuria, hematuria,  rash, arthralgias, visual complaints, headache, numbness, weakness or ataxia or problems with walking or coordination,  change in mood or  memory.             Outpatient Medications Prior to Visit  Medication Sig Dispense Refill   acetaminophen (TYLENOL) 325 MG tablet Take 2 tablets (650 mg total) by mouth every 6 (six) hours as needed for moderate pain. 30 tablet 0   albuterol (VENTOLIN HFA) 108 (90 Base) MCG/ACT inhaler INHALE 2 PUFFS INTO THE LUNGS EVERY 6 HOURS AS NEEDED FOR WHEEZING OR SHORTNESS OF BREATH 6.7 g 3   augmented betamethasone dipropionate (DIPROLENE-AF) 0.05 % cream Apply topically 2 (two) times daily as needed.     clobetasol ointment (TEMOVATE) 0.05 % Apply 1 Application topically 2 (two) times daily.     Cyanocobalamin (VITAMIN B-12) 1000 MCG SUBL Place 1,000 mcg under the tongue. Taking twice a week     docusate sodium (COLACE) 100 MG capsule Take 1 capsule (100 mg total) by mouth 2 (two) times daily. (Patient taking differently: Take 100 mg by mouth 2 (two) times daily. Pt takes every morning) 10 capsule 0   famotidine (PEPCID) 20 MG tablet Take 1 tablet (20 mg total) by mouth at bedtime. 30 tablet 11   fluticasone (FLONASE) 50 MCG/ACT nasal spray Place 1 spray  into both nostrils daily as needed for allergies. (Patient taking differently: Place 1 spray into both nostrils in the morning and at bedtime.) 48 g 0   furosemide (LASIX) 20 MG tablet Take 2 tablets (40 mg total) by mouth daily. 90 tablet 1   guaiFENesin (MUCINEX) 600 MG 12 hr tablet Take 300 mg by mouth daily.     ipratropium (ATROVENT) 0.03 % nasal spray Place 2 sprays into both nostrils every 12 (twelve) hours. 30 mL 12   latanoprost (XALATAN) 0.005 % ophthalmic solution Place 1 drop into both eyes at bedtime.     magic mouthwash SOLN Take 10 mLs by mouth 4 (four)  times daily as needed for mouth pain. 15 mL 2   mirtazapine (REMERON) 15 MG tablet Take 1 tablet (15 mg total) by mouth at bedtime. 30 tablet 3   montelukast (SINGULAIR) 10 MG tablet TAKE 1 TABLET BY MOUTH AT BEDTIME 90 tablet 0   Multiple Vitamin (MULTIVITAMIN) capsule Take 1 capsule by mouth daily.     mupirocin ointment (BACTROBAN) 2 % Apply 1 Application topically 2 (two) times daily as needed (dry skin).     OPSUMIT 10 MG tablet Take 1 tablet (10 mg total) by mouth daily. 30 tablet 3   OXYGEN Inhale into the lungs. 2 LMP at night and upon exertion     pantoprazole (PROTONIX) 40 MG tablet Take 1 tablet (40 mg total) by mouth daily. 30 tablet 11   polyethylene glycol (MIRALAX / GLYCOLAX) 17 g packet Take 17 g by mouth daily. 14 each 0   spironolactone (ALDACTONE) 25 MG tablet Take 0.5 tablets (12.5 mg total) by mouth daily. 45 tablet 3   sucralfate (CARAFATE) 1 GM/10ML suspension TAKE 10 MLS BY MOUTH EVERY 6 HOURS AS NEEDED FOR REFLUX 420 mL 0   triamcinolone cream (KENALOG) 0.1 % Apply 1 Application topically 2 (two) times daily.     No facility-administered medications prior to visit.    Past Medical History:  Diagnosis Date   Anemia    Angiodysplasia of stomach and duodenum    Arthus phenomenon    AVM (arteriovenous malformation)    Blood transfusion without reported diagnosis    last 4 units 12-22-15, Iron infusion x2 last -01-07-16,01-14-16.   Breast cancer (HCC) 1989   Left   Candida esophagitis (HCC)    Cataract    Chronic kidney disease    Chronic mild renal insuffiency   CREST syndrome (HCC)    GERD (gastroesophageal reflux disease)    w/ HH   Interstitial lung disease (HCC)    Nodule of kidney    Pericardial effusion    PONV (postoperative nausea and vomiting)    Pulmonary embolus (HCC) 2003   Pulmonary hypertension (HCC)    followed by Dr Bland Span at [facility], now Dr. Penni Bombard visit end 2'17.   Rectal incontinence    Renal cell carcinoma (HCC)    Scleroderma (HCC)     Tubular adenoma of colon    Uterine polyp       Objective:     BP (!) 124/48   Pulse 92   Ht 5\' 3"  (1.6 m)   Wt 108 lb (49 kg)   SpO2 99%   BMI 19.13 kg/m   SpO2: 99 % RA  [facility] [place] amb wf nad    HEENT : Oropharynx  clear      Nasal turbinates nl    NECK :  without  apparent JVD/ palpable Nodes/TM    LUNGS:  no acc muscle use,  Nl contour chest with insp crackles 1/3 up posteriorly s cough on insp   CV:  RRR  no s3  with 3/6  SEM and  increase in P2, and pitting edema R>L despite elastic hose   ABD:  soft and nontender with nl inspiratory excursion in the supine position. No bruits or organomegaly appreciated   MS:  Nl gait/ ext warm without deformities Or obvious joint restrictions  calf tenderness, cyanosis or clubbing    SKIN: warm and dry without lesions    NEURO:  alert, approp, nl sensorium with  no motor or cerebellar deficits apparent.        Lab Results  Component Value Date   HGB 9.6 (L) 06/30/2023   HGB 9.1 (L) 06/16/2023   HGB 9.4 (L) 06/04/2023   HGB 10.0 (L) 06/03/2023   HGB 10.3 (L) 03/25/2023   HGB 10.0 (L) 02/11/2023   HGB 11.2 04/21/2022     Lab Results  Component Value Date   CREATININE 1.32 (H) 06/30/2023   CREATININE 1.21 (H) 06/16/2023   CREATININE 1.32 (H) 06/12/2023       Assessment   Pulmonary hypertension (HCC) 2nd to scleroderma already on aldactone, lasix and opsumit as of  ov 07/15/2023   - still has pitting edema on exam with diurectics being adjusted by Dr Jens Som but limited by CRI so deferred all adjustments back to cards   Chronic respiratory failure with hypoxia and hypercapnia (HCC) HC03   06/30/23  33  - 07/15/2023   Walked on RA  x  1  lap(s) =  approx 250  ft  @ slow pace, stopped due to desats to 88% corrected on 2lpm  - 07/15/2023 increase HS to 3lpm and recheck  ONO on 3lpm   Unfortunately the PAH, AS and anemia all synergistically reduce 02 delivery if thru different mechanisms so need target sats  of low to mid 90s at all times   Each maintenance medication was reviewed in detail including emphasizing most importantly the difference between maintenance and prns and under what circumstances the prns are to be triggered using an action plan format where appropriate.  Total time for H and P, chart review, counseling, reviewing hfa device(s) , directly observing portions of ambulatory 02 saturation study/ and generating customized AVS unique to this office visit / same day charting = 40 min for pt not seen by me in over 10 y                  Sandrea Hughs, MD 07/15/2023

## 2023-07-15 NOTE — Assessment & Plan Note (Addendum)
2nd to scleroderma already on aldactone, lasix and opsumit as of  ov 07/15/2023   - still has pitting edema on exam with diurectics being adjusted by Dr Jens Som but limited by CRI so deferred all adjustments back to cards

## 2023-07-15 NOTE — Assessment & Plan Note (Signed)
HC03   06/30/23  33  - 07/15/2023   Walked on RA  x  1  lap(s) =  approx 250  ft  @ slow pace, stopped due to desats to 88% corrected on 2lpm  - 07/15/2023 increase HS to 3lpm and recheck  ONO on 3lpm   Unfortunately the PAH, AS and anemia all synergistically reduce 02 delivery if thru different mechanisms so need target sats of low to mid 90s at all times   Each maintenance medication was reviewed in detail including emphasizing most importantly the difference between maintenance and prns and under what circumstances the prns are to be triggered using an action plan format where appropriate.  Total time for H and P, chart review, counseling, reviewing hfa device(s) , directly observing portions of ambulatory 02 saturation study/ and generating customized AVS unique to this office visit / same day charting = 40 min for pt not seen by me in over 10 y

## 2023-07-16 ENCOUNTER — Inpatient Hospital Stay: Payer: Medicare PPO

## 2023-07-16 DIAGNOSIS — E538 Deficiency of other specified B group vitamins: Secondary | ICD-10-CM | POA: Diagnosis not present

## 2023-07-16 DIAGNOSIS — D649 Anemia, unspecified: Secondary | ICD-10-CM | POA: Diagnosis not present

## 2023-07-16 MED ORDER — CYANOCOBALAMIN 1000 MCG/ML IJ SOLN
1000.0000 ug | Freq: Once | INTRAMUSCULAR | Status: AC
  Administered 2023-07-16: 1000 ug via INTRAMUSCULAR
  Filled 2023-07-16: qty 1

## 2023-07-17 ENCOUNTER — Telehealth: Payer: Self-pay | Admitting: Pulmonary Disease

## 2023-07-17 NOTE — Telephone Encounter (Signed)
Paula Ward states our office needs to call pharmacy to complete enrollment for Opsumit. Paula Ward phone number is [phone]. Enrollment number is [id number].

## 2023-07-20 ENCOUNTER — Encounter: Payer: Self-pay | Admitting: Family Medicine

## 2023-07-20 ENCOUNTER — Ambulatory Visit (INDEPENDENT_AMBULATORY_CARE_PROVIDER_SITE_OTHER): Payer: Medicare PPO | Admitting: Family Medicine

## 2023-07-20 VITALS — BP 124/72 | HR 78 | Temp 97.5°F | Resp 22 | Ht 63.0 in | Wt 108.0 lb

## 2023-07-20 DIAGNOSIS — I5032 Chronic diastolic (congestive) heart failure: Secondary | ICD-10-CM | POA: Diagnosis not present

## 2023-07-20 NOTE — Patient Instructions (Addendum)
Take an extra tablet of furosemide (Lasix) for the next 3 days. You may add this after lunch.

## 2023-07-20 NOTE — Progress Notes (Signed)
Assessment & Plan:  1. Chronic diastolic heart failure (HCC) Advised to take an extra 20 mg of furosemide in the early afternoon x3 days. Encouraged to schedule a follow-up with her cardiologist. Continue elevating legs when sitting or lying; discussed elevating to the level of the heart. Encouraged application of compression hose daily, first thing in the morning and removing at night when she goes to bed. Education provided on edema.    Follow up plan: Return if symptoms worsen or fail to improve.  Paula Boston, MSN, APRN, FNP-C  Subjective:  HPI: Paula Ward is a 87 y.o. female presenting on 07/20/2023 for Edema (Seen PULM recently - was told to increase oxygen (sleeps with it at night) )  Patient is concerned about her continued edema in both lower extremities since she was in the hospital. She has light compression hose, but does not wear them consistently. She does elevate her legs when sitting. She does not sleep well and ends up sitting up on the side of the bed during the night. She does weigh herself daily and reports she only fluctuates 1 lb. Cardiology increased furosemide from 20 mg to 40 mg daily, which she reports compliance with, but states it did not decrease her swelling.     ROS: Negative unless specifically indicated above in HPI.   Relevant past medical history reviewed and updated as indicated.   Allergies and medications reviewed and updated.   Current Outpatient Medications:    acetaminophen (TYLENOL) 325 MG tablet, Take 2 tablets (650 mg total) by mouth every 6 (six) hours as needed for moderate pain., Disp: 30 tablet, Rfl: 0   albuterol (VENTOLIN HFA) 108 (90 Base) MCG/ACT inhaler, INHALE 2 PUFFS INTO THE LUNGS EVERY 6 HOURS AS NEEDED FOR WHEEZING OR SHORTNESS OF BREATH, Disp: 6.7 g, Rfl: 3   augmented betamethasone dipropionate (DIPROLENE-AF) 0.05 % cream, Apply topically 2 (two) times daily as needed., Disp: , Rfl:    clobetasol ointment (TEMOVATE) 0.05  %, Apply 1 Application topically 2 (two) times daily., Disp: , Rfl:    Cyanocobalamin (VITAMIN B-12) 1000 MCG SUBL, Place 1,000 mcg under the tongue. Taking twice a week, Disp: , Rfl:    docusate sodium (COLACE) 100 MG capsule, Take 1 capsule (100 mg total) by mouth 2 (two) times daily. (Patient taking differently: Take 100 mg by mouth 2 (two) times daily. Pt takes every morning), Disp: 10 capsule, Rfl: 0   famotidine (PEPCID) 20 MG tablet, Take 1 tablet (20 mg total) by mouth at bedtime., Disp: 30 tablet, Rfl: 11   fluticasone (FLONASE) 50 MCG/ACT nasal spray, Place 1 spray into both nostrils daily as needed for allergies. (Patient taking differently: Place 1 spray into both nostrils in the morning and at bedtime.), Disp: 48 g, Rfl: 0   furosemide (LASIX) 20 MG tablet, Take 2 tablets (40 mg total) by mouth daily., Disp: 90 tablet, Rfl: 1   guaiFENesin (MUCINEX) 600 MG 12 hr tablet, Take 300 mg by mouth daily., Disp: , Rfl:    ipratropium (ATROVENT) 0.03 % nasal spray, Place 2 sprays into both nostrils every 12 (twelve) hours., Disp: 30 mL, Rfl: 12   latanoprost (XALATAN) 0.005 % ophthalmic solution, Place 1 drop into both eyes at bedtime., Disp: , Rfl:    magic mouthwash SOLN, Take 10 mLs by mouth 4 (four) times daily as needed for mouth pain., Disp: 15 mL, Rfl: 2   mirtazapine (REMERON) 15 MG tablet, Take 1 tablet (15 mg total) by mouth  at bedtime., Disp: 30 tablet, Rfl: 3   montelukast (SINGULAIR) 10 MG tablet, TAKE 1 TABLET BY MOUTH AT BEDTIME, Disp: 90 tablet, Rfl: 0   Multiple Vitamin (MULTIVITAMIN) capsule, Take 1 capsule by mouth daily., Disp: , Rfl:    mupirocin ointment (BACTROBAN) 2 %, Apply 1 Application topically 2 (two) times daily as needed (dry skin)., Disp: , Rfl:    OPSUMIT 10 MG tablet, Take 1 tablet (10 mg total) by mouth daily., Disp: 30 tablet, Rfl: 3   OXYGEN, Inhale into the lungs. 2 LMP at night and upon exertion, Disp: , Rfl:    pantoprazole (PROTONIX) 40 MG tablet, Take 1  tablet (40 mg total) by mouth daily., Disp: 30 tablet, Rfl: 11   polyethylene glycol (MIRALAX / GLYCOLAX) 17 g packet, Take 17 g by mouth daily., Disp: 14 each, Rfl: 0   spironolactone (ALDACTONE) 25 MG tablet, Take 0.5 tablets (12.5 mg total) by mouth daily., Disp: 45 tablet, Rfl: 3   sucralfate (CARAFATE) 1 GM/10ML suspension, TAKE 10 MLS BY MOUTH EVERY 6 HOURS AS NEEDED FOR REFLUX, Disp: 420 mL, Rfl: 0   triamcinolone cream (KENALOG) 0.1 %, Apply 1 Application topically 2 (two) times daily., Disp: , Rfl:   Allergies  Allergen Reactions   Codeine Nausea Only    Hallucinations, too   Other Nausea And Vomiting    "-mycin" antibiotics.   Also cause hallucinations.   Erythromycin Nausea And Vomiting   Iodinated Contrast Media Other (See Comments)    Renal issues   Lisinopril     Pt doesn't remember    Mycophenolate Mofetil Nausea Only   Sulfa Antibiotics Other (See Comments)    unknown    Objective:   BP 124/72   Pulse 78   Temp (!) 97.5 F (36.4 C)   Resp (!) 22   Ht 5\' 3"  (1.6 m)   Wt 108 lb (49 kg)   SpO2 91% Comment: RA (only uses oxygen at night)  BMI 19.13 kg/m    Physical Exam Vitals reviewed.  Constitutional:      General: She is not in acute distress.    Appearance: Normal appearance. She is not ill-appearing, toxic-appearing or diaphoretic.  HENT:     Head: Normocephalic and atraumatic.  Eyes:     General: No scleral icterus.       Right eye: No discharge.        Left eye: No discharge.     Conjunctiva/sclera: Conjunctivae normal.  Cardiovascular:     Rate and Rhythm: Normal rate and regular rhythm.     Heart sounds: Murmur heard.     No friction rub. No gallop.  Pulmonary:     Effort: Pulmonary effort is normal. No respiratory distress.     Breath sounds: Normal breath sounds. No stridor. No wheezing, rhonchi or rales.  Musculoskeletal:        General: Normal range of motion.     Cervical back: Normal range of motion.     Right lower leg: Edema  present.     Left lower leg: Edema present.  Skin:    General: Skin is warm and dry.     Capillary Refill: Capillary refill takes less than 2 seconds.  Neurological:     General: No focal deficit present.     Mental Status: She is alert and oriented to person, place, and time. Mental status is at baseline.  Psychiatric:        Mood and Affect: Mood normal.  Behavior: Behavior normal.        Thought Content: Thought content normal.        Judgment: Judgment normal.

## 2023-07-21 ENCOUNTER — Other Ambulatory Visit: Payer: Medicare PPO

## 2023-07-22 ENCOUNTER — Inpatient Hospital Stay: Payer: Medicare PPO

## 2023-07-22 ENCOUNTER — Inpatient Hospital Stay: Payer: Medicare PPO | Attending: Physician Assistant

## 2023-07-22 DIAGNOSIS — D649 Anemia, unspecified: Secondary | ICD-10-CM | POA: Insufficient documentation

## 2023-07-22 DIAGNOSIS — E538 Deficiency of other specified B group vitamins: Secondary | ICD-10-CM | POA: Insufficient documentation

## 2023-07-22 LAB — CBC WITH DIFFERENTIAL (CANCER CENTER ONLY)
Abs Immature Granulocytes: 0.02 10*3/uL (ref 0.00–0.07)
Basophils Absolute: 0 10*3/uL (ref 0.0–0.1)
Basophils Relative: 1 %
Eosinophils Absolute: 0.1 10*3/uL (ref 0.0–0.5)
Eosinophils Relative: 3 %
HCT: 29.7 % — ABNORMAL LOW (ref 36.0–46.0)
Hemoglobin: 9.5 g/dL — ABNORMAL LOW (ref 12.0–15.0)
Immature Granulocytes: 0 %
Lymphocytes Relative: 17 %
Lymphs Abs: 0.8 10*3/uL (ref 0.7–4.0)
MCH: 35.7 pg — ABNORMAL HIGH (ref 26.0–34.0)
MCHC: 32 g/dL (ref 30.0–36.0)
MCV: 111.7 fL — ABNORMAL HIGH (ref 80.0–100.0)
Monocytes Absolute: 0.3 10*3/uL (ref 0.1–1.0)
Monocytes Relative: 6 %
Neutro Abs: 3.6 10*3/uL (ref 1.7–7.7)
Neutrophils Relative %: 73 %
Platelet Count: 167 10*3/uL (ref 150–400)
RBC: 2.66 MIL/uL — ABNORMAL LOW (ref 3.87–5.11)
RDW: 13.2 % (ref 11.5–15.5)
WBC Count: 4.9 10*3/uL (ref 4.0–10.5)
nRBC: 0 % (ref 0.0–0.2)

## 2023-07-22 LAB — VITAMIN B12: Vitamin B-12: 1325 pg/mL — ABNORMAL HIGH (ref 180–914)

## 2023-07-22 MED ORDER — CYANOCOBALAMIN 1000 MCG/ML IJ SOLN
1000.0000 ug | Freq: Once | INTRAMUSCULAR | Status: AC
  Administered 2023-07-22: 1000 ug via INTRAMUSCULAR
  Filled 2023-07-22: qty 1

## 2023-07-22 NOTE — Patient Instructions (Signed)
 Vitamin B12 Injection What is this medication? Vitamin B12 (VAHY tuh min B12) prevents and treats low vitamin B12 levels in your body. It is used in people who do not get enough vitamin B12 from their diet or when their digestive tract does not absorb enough. Vitamin B12 plays an important role in maintaining the health of your nervous system and red blood cells. This medicine may be used for other purposes; ask your health care provider or pharmacist if you have questions. COMMON BRAND NAME(S): B-12 Compliance Kit, B-12 Injection Kit, Cyomin, Dodex, LA-12, Nutri-Twelve, Physicians EZ Use B-12, Primabalt, Vitamin Deficiency Injectable System - B12 What should I tell my care team before I take this medication? They need to know if you have any of these conditions: Kidney disease Leber's disease Megaloblastic anemia An unusual or allergic reaction to cyanocobalamin, cobalt, other medications, foods, dyes, or preservatives Pregnant or trying to get pregnant Breast-feeding How should I use this medication? This medication is injected into a muscle or deeply under the skin. It is usually given in a clinic or care team's office. However, your care team may teach you how to inject yourself. Follow all instructions. Talk to your care team about the use of this medication in children. Special care may be needed. Overdosage: If you think you have taken too much of this medicine contact a poison control center or emergency room at once. NOTE: This medicine is only for you. Do not share this medicine with others. What if I miss a dose? If you are given your dose at a clinic or care team's office, call to reschedule your appointment. If you give your own injections, and you miss a dose, take it as soon as you can. If it is almost time for your next dose, take only that dose. Do not take double or extra doses. What may interact with this medication? Alcohol Colchicine This list may not describe all possible  interactions. Give your health care provider a list of all the medicines, herbs, non-prescription drugs, or dietary supplements you use. Also tell them if you smoke, drink alcohol, or use illegal drugs. Some items may interact with your medicine. What should I watch for while using this medication? Visit your care team regularly. You may need blood work done while you are taking this medication. You may need to follow a special diet. Talk to your care team. Limit your alcohol intake and avoid smoking to get the best benefit. What side effects may I notice from receiving this medication? Side effects that you should report to your care team as soon as possible: Allergic reactions--skin rash, itching, hives, swelling of the face, lips, tongue, or throat Swelling of the ankles, hands, or feet Trouble breathing Side effects that usually do not require medical attention (report to your care team if they continue or are bothersome): Diarrhea This list may not describe all possible side effects. Call your doctor for medical advice about side effects. You may report side effects to FDA at 1-800-FDA-1088. Where should I keep my medication? Keep out of the reach of children. Store at room temperature between 15 and 30 degrees C (59 and 85 degrees F). Protect from light. Throw away any unused medication after the expiration date. NOTE: This sheet is a summary. It may not cover all possible information. If you have questions about this medicine, talk to your doctor, pharmacist, or health care provider.  2024 Elsevier/Gold Standard (2021-06-18 00:00:00)

## 2023-07-22 NOTE — Telephone Encounter (Signed)
Pt calling back about Pt mediciation

## 2023-07-23 ENCOUNTER — Other Ambulatory Visit: Payer: Self-pay | Admitting: Physician Assistant

## 2023-07-23 ENCOUNTER — Telehealth: Payer: Self-pay | Admitting: Hematology and Oncology

## 2023-07-23 NOTE — Telephone Encounter (Signed)
Paula Ward states our office needs to call pharmacy to complete enrollment for [employer]. Paula Ward phone number is [phone]. Enrollment number is [phone]. They are soon going to close the referral, pls advise

## 2023-07-24 ENCOUNTER — Telehealth: Payer: Self-pay | Admitting: Pulmonary Disease

## 2023-07-24 DIAGNOSIS — J9611 Chronic respiratory failure with hypoxia: Secondary | ICD-10-CM

## 2023-07-24 DIAGNOSIS — J849 Interstitial pulmonary disease, unspecified: Secondary | ICD-10-CM

## 2023-07-24 DIAGNOSIS — R0609 Other forms of dyspnea: Secondary | ICD-10-CM

## 2023-07-24 NOTE — Telephone Encounter (Signed)
Patient states needs new order for POC. Patient states the one she has is broken. Patient uses [facility] for oxygen. Patient phone number is [phone].

## 2023-07-27 ENCOUNTER — Telehealth: Payer: Self-pay | Admitting: Internal Medicine

## 2023-07-27 NOTE — Telephone Encounter (Signed)
Patient states [facility] needs a walk test for oxygen. Patient states did walk test on 07/15/2023.  Patient phone is [phone].

## 2023-07-27 NOTE — Telephone Encounter (Signed)
Patient checking on message for POC. Patient phone number is [phone] and [phone].

## 2023-07-28 ENCOUNTER — Inpatient Hospital Stay: Payer: Medicare PPO | Admitting: Primary Care

## 2023-07-28 ENCOUNTER — Telehealth: Payer: Self-pay | Admitting: Internal Medicine

## 2023-07-28 NOTE — Telephone Encounter (Signed)
[place], Tereasa Coop, [place]; [place], Ash[place] This patient purchased her own POC. We picked ours up, so insurance is no longer paying for that. She will need a new office visit along with a walk test showing the need for portability and a new Rx for the POC in order to have insurance start paying again. We cannot do a titration for this.

## 2023-07-28 NOTE — Telephone Encounter (Signed)
Ok prefer the 1130 slot

## 2023-07-28 NOTE — Telephone Encounter (Signed)
Dr Sherene Sires- please advise if okay to add on to your schedule as overbook so that we can walk her again and order o2 through [facility]- see msg from DME below.

## 2023-07-28 NOTE — Addendum Note (Signed)
Addended by: Judd Gaudier on: 07/28/2023 03:05 PM   Modules accepted: Orders

## 2023-07-28 NOTE — Telephone Encounter (Signed)
Chantel C. Please reach out to [facility] about this order.  The walk was documented within the OV note.

## 2023-07-28 NOTE — Telephone Encounter (Signed)
The Stats need to be updated per DME for Insurance Puposes. They need the stats on resting, stating Dr. Sherene Sires adding the portability for COPD and make sure the order also stated for POC on 2l during exertion and 3l at bedtime

## 2023-07-28 NOTE — Telephone Encounter (Signed)
See phone note dated 07/28/23.

## 2023-07-29 NOTE — Telephone Encounter (Signed)
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  ATC patient to advise that this is being worked on - unable to leave VM because VM box is full.   Prescriber form placed in Tammy's mailbox for signature  Chesley Mires, PharmD, MPH, BCPS, CPP Clinical Pharmacist (Rheumatology and Pulmonology)

## 2023-07-29 NOTE — Telephone Encounter (Signed)
Enrollment # provided connects with the REMs program enrollment, will f/u when we are prepared to submit.

## 2023-07-30 LAB — METHYLMALONIC ACID, SERUM: Methylmalonic Acid, Quantitative: 539 nmol/L — ABNORMAL HIGH (ref 0–378)

## 2023-07-30 NOTE — Telephone Encounter (Signed)
Patient states just had visit with Dr. Sherene Sires. States just need to replace broken oxygen machine. Declined appointment due to not able to drive safely. Patient phone number is [phone] and [phone].

## 2023-07-31 ENCOUNTER — Other Ambulatory Visit: Payer: Self-pay | Admitting: Internal Medicine

## 2023-07-31 NOTE — Telephone Encounter (Signed)
PT calling again for 02. Please see notes below and call pt to advise. She states she is desperate. States [facility] waiting for an answer from [place]. Her # is [phone] Says she already was seen and did a walk already.

## 2023-08-02 DIAGNOSIS — I509 Heart failure, unspecified: Secondary | ICD-10-CM | POA: Diagnosis not present

## 2023-08-02 NOTE — Telephone Encounter (Signed)
Called patient, she did not answer. Will attempt to call back later.

## 2023-08-03 ENCOUNTER — Other Ambulatory Visit: Payer: Self-pay | Admitting: Family Medicine

## 2023-08-03 ENCOUNTER — Ambulatory Visit: Payer: Medicare PPO | Admitting: Family Medicine

## 2023-08-03 ENCOUNTER — Encounter: Payer: Self-pay | Admitting: Family Medicine

## 2023-08-03 ENCOUNTER — Encounter: Payer: Self-pay | Admitting: Cardiology

## 2023-08-03 VITALS — BP 122/60 | HR 85 | Temp 97.2°F | Resp 22 | Ht 63.0 in | Wt 110.0 lb

## 2023-08-03 DIAGNOSIS — M545 Low back pain, unspecified: Secondary | ICD-10-CM | POA: Diagnosis not present

## 2023-08-03 DIAGNOSIS — I5032 Chronic diastolic (congestive) heart failure: Secondary | ICD-10-CM

## 2023-08-03 DIAGNOSIS — J9612 Chronic respiratory failure with hypercapnia: Secondary | ICD-10-CM

## 2023-08-03 DIAGNOSIS — J9611 Chronic respiratory failure with hypoxia: Secondary | ICD-10-CM | POA: Diagnosis not present

## 2023-08-03 LAB — POCT URINALYSIS DIPSTICK
Bilirubin, UA: NEGATIVE
Blood, UA: NEGATIVE
Glucose, UA: NEGATIVE
Ketones, UA: NEGATIVE
Leukocytes, UA: NEGATIVE
Nitrite, UA: NEGATIVE
Protein, UA: NEGATIVE
Spec Grav, UA: 1.015 (ref 1.010–1.025)
Urobilinogen, UA: NEGATIVE U/dL — AB
pH, UA: 7 (ref 5.0–8.0)

## 2023-08-03 LAB — BASIC METABOLIC PANEL
BUN: 42 mg/dL — ABNORMAL HIGH (ref 6–23)
CO2: 35 meq/L — ABNORMAL HIGH (ref 19–32)
Calcium: 9.2 mg/dL (ref 8.4–10.5)
Chloride: 93 meq/L — ABNORMAL LOW (ref 96–112)
Creatinine, Ser: 1.29 mg/dL — ABNORMAL HIGH (ref 0.40–1.20)
GFR: 37.99 mL/min — ABNORMAL LOW (ref >60.00)
Glucose, Bld: 89 mg/dL (ref 70–99)
Potassium: 4.4 meq/L (ref 3.5–5.1)
Sodium: 135 meq/L (ref 135–145)

## 2023-08-03 MED ORDER — SPIRONOLACTONE 25 MG PO TABS
25.0000 mg | ORAL_TABLET | Freq: Every day | ORAL | 0 refills | Status: DC
Start: 2023-08-03 — End: 2023-08-15

## 2023-08-03 NOTE — Progress Notes (Signed)
Assessment & Plan:  1. Chronic diastolic heart failure (HCC) Encouraged patient to schedule a follow-up with her cardiologist as previously advised.  Continue to elevate legs and wearing compression hose.  BMP today to assess kidney function and potassium.  Discussed if appropriate after her lab work returns I will increase spironolactone from 12.5 mg to 25 mg once daily. - Basic metabolic panel  2. Acute midline low back pain without sciatica Encouraged Tylenol for low back pain. - POCT urinalysis dipstick - Urine Culture; Future  3. Chronic respiratory failure with hypoxia and hypercapnia (HCC) Encouraged patient to wear her continuous oxygen as it was removed during the visit and she was planning to leave without it on.   Follow up plan: Return if symptoms worsen or fail to improve.  Deliah Boston, MSN, APRN, FNP-C  Subjective:  HPI: Paula Ward is a 87 y.o. female presenting on 08/03/2023 for Edema (Right leg /New on portable oxygen all day -  liters ) and Back Pain (Lower back - would like a urine checked )  Patient reports increased swelling to both legs (right > left). She was seen previously on 07/20/2023 at which time she was advised to increase furosemide by 20 mg x 3 days, schedule a follow-up with her cardiologist, elevate legs when sitting or lying, and to apply compression hose daily.  She reports her swelling did not improve with the increase in furosemide.  She has been elevating her legs when sitting or lying.  She started wearing compression hose.  She did not schedule a follow-up with her cardiologist.  She is also concerned about low back pain and would like her urine checked.   ROS: Negative unless specifically indicated above in HPI.   Relevant past medical history reviewed and updated as indicated.   Allergies and medications reviewed and updated.   Current Outpatient Medications:    acetaminophen (TYLENOL) 325 MG tablet, Take 2 tablets (650 mg total)  by mouth every 6 (six) hours as needed for moderate pain., Disp: 30 tablet, Rfl: 0   albuterol (VENTOLIN HFA) 108 (90 Base) MCG/ACT inhaler, INHALE 2 PUFFS INTO THE LUNGS EVERY 6 HOURS AS NEEDED FOR WHEEZING OR SHORTNESS OF BREATH, Disp: 6.7 g, Rfl: 3   augmented betamethasone dipropionate (DIPROLENE-AF) 0.05 % cream, Apply topically 2 (two) times daily as needed., Disp: , Rfl:    clobetasol ointment (TEMOVATE) 0.05 %, Apply 1 Application topically 2 (two) times daily., Disp: , Rfl:    Cyanocobalamin (VITAMIN B-12) 1000 MCG SUBL, Place 1,000 mcg under the tongue. Taking twice a week, Disp: , Rfl:    docusate sodium (COLACE) 100 MG capsule, Take 1 capsule (100 mg total) by mouth 2 (two) times daily. (Patient taking differently: Take 100 mg by mouth 2 (two) times daily. Pt takes every morning), Disp: 10 capsule, Rfl: 0   famotidine (PEPCID) 20 MG tablet, Take 1 tablet (20 mg total) by mouth at bedtime., Disp: 30 tablet, Rfl: 11   fluticasone (FLONASE) 50 MCG/ACT nasal spray, Place 1 spray into both nostrils daily as needed for allergies. (Patient taking differently: Place 1 spray into both nostrils in the morning and at bedtime.), Disp: 48 g, Rfl: 0   furosemide (LASIX) 20 MG tablet, Take 2 tablets (40 mg total) by mouth daily., Disp: 90 tablet, Rfl: 1   guaiFENesin (MUCINEX) 600 MG 12 hr tablet, Take 300 mg by mouth daily., Disp: , Rfl:    ipratropium (ATROVENT) 0.03 % nasal spray, Place 2 sprays into  both nostrils every 12 (twelve) hours., Disp: 30 mL, Rfl: 12   latanoprost (XALATAN) 0.005 % ophthalmic solution, Place 1 drop into both eyes at bedtime., Disp: , Rfl:    magic mouthwash SOLN, Take 10 mLs by mouth 4 (four) times daily as needed for mouth pain., Disp: 15 mL, Rfl: 2   mirtazapine (REMERON) 15 MG tablet, Take 1 tablet (15 mg total) by mouth at bedtime., Disp: 30 tablet, Rfl: 3   montelukast (SINGULAIR) 10 MG tablet, TAKE 1 TABLET BY MOUTH AT BEDTIME, Disp: 90 tablet, Rfl: 0   Multiple  Vitamin (MULTIVITAMIN) capsule, Take 1 capsule by mouth daily., Disp: , Rfl:    mupirocin ointment (BACTROBAN) 2 %, Apply 1 Application topically 2 (two) times daily as needed (dry skin)., Disp: , Rfl:    OPSUMIT 10 MG tablet, Take 1 tablet (10 mg total) by mouth daily., Disp: 30 tablet, Rfl: 3   OXYGEN, Inhale into the lungs. 2 LMP at night and upon exertion, Disp: , Rfl:    pantoprazole (PROTONIX) 40 MG tablet, Take 1 tablet (40 mg total) by mouth daily., Disp: 30 tablet, Rfl: 11   polyethylene glycol (MIRALAX / GLYCOLAX) 17 g packet, Take 17 g by mouth daily., Disp: 14 each, Rfl: 0   spironolactone (ALDACTONE) 25 MG tablet, Take 0.5 tablets (12.5 mg total) by mouth daily., Disp: 45 tablet, Rfl: 3   sucralfate (CARAFATE) 1 GM/10ML suspension, TAKE 10 MLS BY MOUTH EVERY 6 HOURS AS NEEDED FOR REFLUX, Disp: 420 mL, Rfl: 0   triamcinolone cream (KENALOG) 0.1 %, Apply 1 Application topically 2 (two) times daily., Disp: , Rfl:   Allergies  Allergen Reactions   Codeine Nausea Only    Hallucinations, too   Other Nausea And Vomiting    "-mycin" antibiotics.   Also cause hallucinations.   Erythromycin Nausea And Vomiting   Iodinated Contrast Media Other (See Comments)    Renal issues   Lisinopril     Pt doesn't remember    Mycophenolate Mofetil Nausea Only   Sulfa Antibiotics Other (See Comments)    unknown    Objective:   BP 122/60   Pulse 85   Temp (!) 97.2 F (36.2 C)   Resp (!) 22   Ht 5\' 3"  (1.6 m)   Wt 110 lb (49.9 kg)   SpO2 92% Comment: on 3 liters  BMI 19.49 kg/m    Physical Exam Vitals reviewed.  Constitutional:      General: She is not in acute distress.    Appearance: Normal appearance. She is not ill-appearing, toxic-appearing or diaphoretic.  HENT:     Head: Normocephalic and atraumatic.  Eyes:     General: No scleral icterus.       Right eye: No discharge.        Left eye: No discharge.     Conjunctiva/sclera: Conjunctivae normal.  Cardiovascular:     Rate  and Rhythm: Normal rate and regular rhythm.     Heart sounds: Murmur heard.     No friction rub. No gallop.  Pulmonary:     Effort: Pulmonary effort is normal. No respiratory distress.     Breath sounds: No stridor. Examination of the right-lower field reveals rales. Examination of the left-lower field reveals rales. Rales present. No wheezing or rhonchi.     Comments: Wearing continuous oxygen concentrator at 3L.  Musculoskeletal:        General: Normal range of motion.     Cervical back: Normal range of motion.  Right lower leg: 2+ Edema (R>L) present.     Left lower leg: 2+ Edema present.  Skin:    General: Skin is warm and dry.     Capillary Refill: Capillary refill takes less than 2 seconds.  Neurological:     General: No focal deficit present.     Mental Status: She is alert and oriented to person, place, and time. Mental status is at baseline.  Psychiatric:        Mood and Affect: Mood normal.        Behavior: Behavior normal.        Thought Content: Thought content normal.        Judgment: Judgment normal.

## 2023-08-03 NOTE — Telephone Encounter (Signed)
Tried calling the pt and there was no answer, unable to leave her a msg since her VM is full  Closing encounter per protocol.

## 2023-08-04 ENCOUNTER — Telehealth: Payer: Self-pay | Admitting: Cardiology

## 2023-08-04 LAB — URINE CULTURE: Result:: NO GROWTH

## 2023-08-04 NOTE — Telephone Encounter (Signed)
Patient is following up, requesting a call back regarding recent patient message.

## 2023-08-04 NOTE — Telephone Encounter (Signed)
Patient is returning phone call.  °

## 2023-08-04 NOTE — Telephone Encounter (Signed)
Lewayne Bunting, MD  Freddi Starr, RN59 minutes ago (1:34 PM)    Increase lasix to 40 mg BID until seen in office Paula Ward   Spoke with pt regarding increasing lasix to 40mg  twice daily until she is seen in clinic on Thursday. She verbalizes understanding.

## 2023-08-04 NOTE — Telephone Encounter (Signed)
Called in regards to this message from 08/03/23: (No answer and unable to leave message- no voicemail) URGENT! Since August 15th, I have gained 10lbs, and Sept 25 (2 1/2 weeks ago)I have gained 2 lbs. I am on Lasix (40mg  once a day) .I am wearing compression stockings, but the right leg is swelling a great deal.  When wearing  the stockings the fluid builds up at the knee.  I saw my primary Dr,s NP  and she directed me to contract  your office ASAP.  I have also seen your  NP Edd Fabian  twice during this time. I have several questions I would like to ask you.  Also I have two attacks of heartburn in the last two weeks.  I have never had heartburn this bad before. Also I have had to go on oxygen just about  24/7 because of having trouble breathing and sleeping. I would like to get an appt with you  as soon as possible. Thank you for taking care of this matter. Paula Ward [phone] or [phone])   Scheduled an appt with Edd Fabian 08/06/23 at 2:45pm- Will sent a MyChart message since I am unable to get in touch with this patient over the phone   Informed the patient of appt scheduled 08/06/23 at 2:45. Given ER precautions. She verbalized understanding.

## 2023-08-04 NOTE — Telephone Encounter (Signed)
Mailbox full unable to LVM

## 2023-08-05 DIAGNOSIS — I509 Heart failure, unspecified: Secondary | ICD-10-CM | POA: Diagnosis not present

## 2023-08-05 NOTE — Progress Notes (Unsigned)
Cardiology Clinic Note   Patient Name: Paula Ward Date of Encounter: 08/06/2023  Primary Care Provider:  [name], MD Primary Cardiologist:  Olga Millers, MD  Patient Profile    Paula Ward 87 year old female presents the clinic today for follow-up evaluation of her lower extremity edema/chronic diastolic CHF.  Past Medical History    Past Medical History:  Diagnosis Date   Anemia    Angiodysplasia of stomach and duodenum    Arthus phenomenon    AVM (arteriovenous malformation)    Blood transfusion without reported diagnosis    last 4 units 12-22-15, Iron infusion x2 last -01-07-16,01-14-16.   Breast cancer (HCC) 1989   Left   Candida esophagitis (HCC)    Cataract    Chronic kidney disease    Chronic mild renal insuffiency   Corneal edema    Corneal epithelial basement membrane dystrophy    CREST syndrome (HCC)    GERD (gastroesophageal reflux disease)    w/ HH   Interstitial lung disease (HCC)    Nodule of kidney    Pericardial effusion    PONV (postoperative nausea and vomiting)    Pulmonary embolus (HCC) 2003   Pulmonary hypertension (HCC)    followed by Dr Bland Span at [facility], now Dr. Penni Bombard visit end 2'17.   Rectal incontinence    Renal cell carcinoma (HCC)    Scleroderma (HCC)    Tubular adenoma of colon    Uterine polyp    Past Surgical History:  Procedure Laterality Date   APPENDECTOMY  1962   BREAST LUMPECTOMY  1989   left   CATARACT EXTRACTION, BILATERAL Bilateral 12/2013   COLONOSCOPY WITH PROPOFOL N/A 04/15/2021   Procedure: COLONOSCOPY WITH PROPOFOL;  Surgeon: Rachael Fee, MD;  Location: [facility];  Service: Endoscopy;  Laterality: N/A;   ENTEROSCOPY N/A 01/18/2016   Procedure: ENTEROSCOPY;  Surgeon: Napoleon Form, MD;  Location: [facility];  Service: Endoscopy;  Laterality: N/A;   ENTEROSCOPY N/A 05/24/2018   Procedure: ENTEROSCOPY;  Surgeon: Lynann Bologna, MD;  Location: [facility];  Service: Endoscopy;   Laterality: N/A;   ENTEROSCOPY N/A 03/29/2021   Procedure: ENTEROSCOPY;  Surgeon: Beverley Fiedler, MD;  Location: [facility];  Service: Gastroenterology;  Laterality: N/A;   ENTEROSCOPY N/A 04/13/2021   Procedure: ENTEROSCOPY;  Surgeon: Benancio Deeds, MD;  Location: [facility];  Service: Gastroenterology;  Laterality: N/A;   ESOPHAGOGASTRODUODENOSCOPY (EGD) WITH PROPOFOL N/A 12/21/2015   Procedure: ESOPHAGOGASTRODUODENOSCOPY (EGD) WITH PROPOFOL;  Surgeon: Hilarie Fredrickson, MD;  Location: [facility];  Service: Endoscopy;  Laterality: N/A;   HOT HEMOSTASIS N/A 05/24/2018   Procedure: HOT HEMOSTASIS (ARGON PLASMA COAGULATION/BICAP);  Surgeon: Lynann Bologna, MD;  Location: [facility];  Service: Endoscopy;  Laterality: N/A;   HOT HEMOSTASIS N/A 03/29/2021   Procedure: HOT HEMOSTASIS (ARGON PLASMA COAGULATION/BICAP);  Surgeon: Beverley Fiedler, MD;  Location: [facility];  Service: Gastroenterology;  Laterality: N/A;   HOT HEMOSTASIS N/A 04/13/2021   Procedure: HOT HEMOSTASIS (ARGON PLASMA COAGULATION/BICAP);  Surgeon: Benancio Deeds, MD;  Location: [facility];  Service: Gastroenterology;  Laterality: N/A;   INTRAMEDULLARY (IM) NAIL INTERTROCHANTERIC Left 10/11/2022   Procedure: INTRAMEDULLARY (IM) NAIL INTERTROCHANTERIC;  Surgeon: Ollen Gross, MD;  Location: [facility];  Service: Orthopedics;  Laterality: Left;   IR GENERIC HISTORICAL  06/05/2016   IR RADIOLOGIST EVAL & MGMT 06/05/2016 [name], MD [facility] INTERV RAD   IVC Filter     KIDNEY SURGERY     left -"laser  surgery by Dr. Fredia Sorrow- 5 yrs ago" no removal   RIGHT/LEFT HEART CATH AND CORONARY ANGIOGRAPHY N/A 04/25/2022   Procedure: RIGHT/LEFT HEART CATH AND CORONARY ANGIOGRAPHY;  Surgeon: Tonny Bollman, MD;  Location: [facility] INVASIVE CV LAB;  Service: Cardiovascular;  Laterality: N/A;   SCHLEROTHERAPY  05/24/2018   Procedure: Theresia Majors;  Surgeon: Lynann Bologna, MD;  Location: [facility];  Service: Endoscopy;;   SUBMUCOSAL TATTOO  INJECTION  04/13/2021   Procedure: SUBMUCOSAL TATTOO INJECTION;  Surgeon: Benancio Deeds, MD;  Location: [facility];  Service: Gastroenterology;;   TONSILLECTOMY     TUBAL LIGATION      Allergies  Allergies  Allergen Reactions   Codeine Nausea Only    Hallucinations, too   Other Nausea And Vomiting    "-mycin" antibiotics.   Also cause hallucinations.   Erythromycin Nausea And Vomiting   Iodinated Contrast Media Other (See Comments)    Renal issues   Lisinopril     Pt doesn't remember    Mycophenolate Mofetil Nausea Only   Sulfa Antibiotics Other (See Comments)    unknown    History of Present Illness    Paula Ward 87 year old female has a PMH of essential hypertension, pulmonary hypertension, pericardial effusion, chronic diastolic CHF, AVM, moderate aortic stenosis, orthostatic hypotension, interstitial lung disease, GERD, CKD stage III, PE, incontinence, scleroderma, anemia, generalized weakness, vitamin B12 deficiency, and macrocytic anemia.  Her PMH also includes gastrointestinal bleeding secondary to AVM malformations which required IVC filter placement and breast CA.  She underwent cardiac catheterization 7/23 which showed no obstructive coronary disease, pulmonary hypertension, moderate aortic stenosis with a mean gradient of 16 mmHg.  She was seen in the emergency room on 06/04/2023 due to increased shortness of breath, chest tightness, and lower extremity edema.  Her oxygen saturation was noted to be 80% on room air.  Her BNP was 2680.  Her chest x-ray showed stable cardiomegaly and pericardial effusion with no pulmonary edema or pulmonary effusion.  Echocardiogram during that time showed LVEF of 70-75%, moderate concentric LVH, mildly reduced RV function and severely dilated LA.  Large pericardial effusion was noted.  This was slightly increased from June.  There was no evidence of tamponade or severe MAC and moderate AS was noted.  She received IV diuresis and was  discharged in stable condition 06/06/2023.  She was seen in follow-up by Reather Littler NP on 06/19/2023.  She reported that she was doing well from a cardiac standpoint.  She denied chest pain and felt her breathing had returned to baseline.  She had been monitoring her weights at home which were stable.  She was having regular lab work at the cancer center and had planned lab work on 06/30/2023.  She contacted the nurse triage line 08/04/2023 and reported that she had increased lower extremity swelling.  She reported a 10 pound weight gain since 8/15 and another 2 pound weight gain since 9/25.  She was taking furosemide 40 mg once daily.  She continued to use lower extremity support stockings.  She was instructed to increase her furosemide to 40 mg twice daily.  She was added to my schedule.  She presents to the clinic today for follow-up evaluation and states she noticed a 6 pound weight gain overnight.  She reports that she was around 100 pounds earlier in the week.  Today in the office her weight is 108.2 pounds.  She reports that she is not able to take 40 mg of Lasix 2 times per day  so she has been taking 60 mg of Lasix for the past 2 days.  Her right leg is greater than her left.  She has noted to have +1-2 pitting edema to her knee her right leg and generalized nonpitting edema on her left leg.  I will continue her 60 mg of Lasix x 4 days and give her 20 mill equivalents of potassium daily.  We will repeat a BMP 1 week and plan follow-up in 1 week.  I reviewed the importance of low-salt diet, daily weights, fluid restriction, and weight log.  She expressed understanding.  She has follow-up appointment with pulmonology this week and is currently using 3 L of oxygen nasal cannula while active.  Today she denies chest pain, lower extremity edema, fatigue, palpitations, melena, hematuria, hemoptysis, diaphoresis, weakness, presyncope, syncope, orthopnea, and PND.   Home Medications    Prior to Admission  medications   Medication Sig Start Date End Date Taking? Authorizing Provider  acetaminophen (TYLENOL) 325 MG tablet Take 2 tablets (650 mg total) by mouth every 6 (six) hours as needed for moderate pain. 07/18/22   Wallis Bamberg, PA-C  albuterol (VENTOLIN HFA) 108 (90 Base) MCG/ACT inhaler INHALE 2 PUFFS INTO THE LUNGS EVERY 6 HOURS AS NEEDED FOR WHEEZING OR SHORTNESS OF BREATH 03/13/23   [name], MD  augmented betamethasone dipropionate (DIPROLENE-AF) 0.05 % cream Apply topically 2 (two) times daily as needed. 07/13/23   [provider]  clobetasol ointment (TEMOVATE) 0.05 % Apply 1 Application topically 2 (two) times daily. 04/20/23   [provider]  Cyanocobalamin (VITAMIN B-12) 1000 MCG SUBL Place 1,000 mcg under the tongue. Taking twice a week    [provider]  docusate sodium (COLACE) 100 MG capsule Take 1 capsule (100 mg total) by mouth 2 (two) times daily. Patient taking differently: Take 100 mg by mouth 2 (two) times daily. Pt takes every morning 10/15/22   Edmisten, Kristie L, PA  famotidine (PEPCID) 20 MG tablet Take 1 tablet (20 mg total) by mouth at bedtime. 02/10/23   Napoleon Form, MD  fluticasone (FLONASE) 50 MCG/ACT nasal spray Place 1 spray into both nostrils daily as needed for allergies. Patient taking differently: Place 1 spray into both nostrils in the morning and at bedtime. 05/15/22   [name], MD  furosemide (LASIX) 20 MG tablet Take 2 tablets (40 mg total) by mouth daily. 06/19/23   Reather Littler D, NP  guaiFENesin (MUCINEX) 600 MG 12 hr tablet Take 300 mg by mouth daily.    [provider]  ipratropium (ATROVENT) 0.03 % nasal spray Place 2 sprays into both nostrils every 12 (twelve) hours. 05/12/22   Martina Sinner, MD  latanoprost (XALATAN) 0.005 % ophthalmic solution Place 1 drop into both eyes at bedtime. 09/23/22   [provider]  magic mouthwash SOLN Take 10 mLs by mouth 4 (four) times daily as  needed for mouth pain. 03/22/22   Burnadette Pop, MD  mirtazapine (REMERON) 15 MG tablet Take 1 tablet (15 mg total) by mouth at bedtime. 03/13/23   [name], MD  montelukast (SINGULAIR) 10 MG tablet TAKE 1 TABLET BY MOUTH AT BEDTIME 07/31/23   [name], MD  Multiple Vitamin (MULTIVITAMIN) capsule Take 1 capsule by mouth daily.    [provider]  mupirocin ointment (BACTROBAN) 2 % Apply 1 Application topically 2 (two) times daily as needed (dry skin).    [provider]  OPSUMIT 10 MG tablet Take 1  tablet (10 mg total) by mouth daily. 07/07/23   Parrett, Virgel Bouquet, NP  OXYGEN Inhale into the lungs. 2 LMP at night and upon exertion    [provider]  pantoprazole (PROTONIX) 40 MG tablet Take 1 tablet (40 mg total) by mouth daily. 02/10/23   Napoleon Form, MD  polyethylene glycol (MIRALAX / GLYCOLAX) 17 g packet Take 17 g by mouth daily. 10/15/22   Edmisten, Lyn Hollingshead, PA  spironolactone (ALDACTONE) 25 MG tablet Take 1 tablet (25 mg total) by mouth daily. 08/03/23   Gwenlyn Fudge, FNP  sucralfate (CARAFATE) 1 GM/10ML suspension TAKE 10 MLS BY MOUTH EVERY 6 HOURS AS NEEDED FOR REFLUX 02/11/23   Napoleon Form, MD  triamcinolone cream (KENALOG) 0.1 % Apply 1 Application topically 2 (two) times daily. 07/07/23   [provider]    Family History    Family History  Problem Relation Age of Onset   Bladder Cancer Father    Diabetes Father    Prostate cancer Father    Alzheimer's disease Mother    Diabetes Sister    Lung cancer Sister         smoker   Esophageal cancer Paternal Uncle    Colon cancer Neg Hx    She indicated that her mother is deceased. She indicated that her father is deceased. She indicated that the status of her sister is unknown. She indicated that the status of her paternal uncle is unknown. She indicated that the status of her neg hx is unknown.  Social History    Social History   Socioeconomic History    Marital status: Married    Spouse name: Not on file   Number of children: 2   Years of education: Not on file   Highest education level: Not on file  Occupational History   Occupation: Retired  Tobacco Use   Smoking status: Never   Smokeless tobacco: Never  Vaping Use   Vaping status: Never Used  Substance and Sexual Activity   Alcohol use: No   Drug use: No   Sexual activity: Not Currently  Other Topics Concern   Not on file  Social History Narrative   Married '611 son- '65, 1 daughter '63; 6 children (2 adopted)SO- SOBRetirement- doing well. Marriage in good health. Patient has never smoked. Alcohol use- noPt gets regular exercise   Social Determinants of Health   Financial Resource Strain: Low Risk  (04/02/2022)   Overall Financial Resource Strain (CARDIA)    Difficulty of Paying Living Expenses: Not hard at all  Food Insecurity: No Food Insecurity (06/09/2023)   Hunger Vital Sign    Worried About Running Out of Food in the Last Year: Never true    Ran Out of Food in the Last Year: Never true  Transportation Needs: No Transportation Needs (06/09/2023)   PRAPARE - Administrator, Civil Service (Medical): No    Lack of Transportation (Non-Medical): No  Physical Activity: Inactive (04/02/2022)   Exercise Vital Sign    Days of Exercise per Week: 0 days    Minutes of Exercise per Session: 0 min  Stress: No Stress Concern Present (04/02/2022)   [employer] of Occupational Health - Occupational Stress Questionnaire    Feeling of Stress : Not at all  Social Connections: Socially Integrated (04/02/2022)   Social Connection and Isolation Panel [NHANES]    Frequency of Communication with Friends and Family: More than three times a week    Frequency of  Social Gatherings with Friends and Family: More than three times a week    Attends Religious Services: More than 4 times per year    Active Member of [employer] or Organizations: Yes    Attends Hospital doctor: More than 4 times per year    Marital Status: Married  Catering manager Violence: Not At Risk (06/04/2023)   Humiliation, Afraid, Rape, and Kick questionnaire    Fear of Current or Ex-Partner: No    Emotionally Abused: No    Physically Abused: No    Sexually Abused: No     Review of Systems    General:  No chills, fever, night sweats or weight changes.  Cardiovascular:  No chest pain, dyspnea on exertion, edema, orthopnea, palpitations, paroxysmal nocturnal dyspnea. Dermatological: No rash, lesions/masses Respiratory: No cough, dyspnea Urologic: No hematuria, dysuria Abdominal:   No nausea, vomiting, diarrhea, bright red blood per rectum, melena, or hematemesis Neurologic:  No visual changes, wkns, changes in mental status. All other systems reviewed and are otherwise negative except as noted above.  Physical Exam    VS:  BP 134/60 (BP Location: Left Arm, Patient Position: Sitting, Cuff Size: Small)   Pulse 75   Ht 5\' 3"  (1.6 m)   Wt 108 lb 3.2 oz (49.1 kg)   SpO2 93%   BMI 19.17 kg/m  , BMI Body mass index is 19.17 kg/m. GEN: Well nourished, well developed, in no acute distress. HEENT: normal. Neck: Supple, no JVD, carotid bruits, or masses. Cardiac: RRR, 3/6 systolic murmur heard along right sternal border., rubs, or gallops. No clubbing, cyanosis, right greater than left pitting 1-2+ edema.  Radials/DP/PT 2+ and equal bilaterally.  Respiratory:  Respirations regular and unlabored, clear to auscultation bilaterally. GI: Soft, nontender, nondistended, BS + x 4. MS: no deformity or atrophy. Skin: warm and dry, no rash. Neuro:  Strength and sensation are intact. Psych: Normal affect.  Accessory Clinical Findings    Recent Labs: 06/03/2023: Pro B Natriuretic peptide (BNP) 2,680.0 06/04/2023: B Natriuretic Peptide 2,107.8 06/06/2023: Magnesium 1.7 06/30/2023: ALT 7 07/22/2023: Hemoglobin 9.5; Platelet Count 167 08/03/2023: BUN 42; Creatinine, Ser 1.29; Potassium  4.4; Sodium 135   Recent Lipid Panel    Component Value Date/Time   CHOL 158 06/24/2018 1004   TRIG 112.0 06/24/2018 1004   HDL 57.50 06/24/2018 1004   CHOLHDL 3 06/24/2018 1004   VLDL 22.4 06/24/2018 1004   LDLCALC 78 06/24/2018 1004         ECG personally reviewed by me today-  none today.     Echocardiogram 06/05/2023  IMPRESSIONS     1. Left ventricular ejection fraction, by estimation, is 70 to 75%. The  left ventricle has hyperdynamic function. The left ventricle has no  regional wall motion abnormalities. There is moderate concentric left  ventricular hypertrophy. Indeterminate  diastolic filling due to E-A fusion.   2. Right ventricular systolic function is mildly reduced. The right  ventricular size is normal.   3. Left atrial size was severely dilated.   4. Large pericardial effusion surrounds heart, mainly posteriorly and  lateral to LV. Naxunak dimension is 5.6 cm posteriorly. It has increased  in size from echo done in June 2024. Does not meet criteria for tamponade  by echo criteria. Clinical  correlation indicated .Marland Kitchen Large pericardial effusion.   5. MV annulus is severely calcified. MV is thickened with restricted  motion. Difficult to see well Mean gradient through the MV is 8 mm Hg. MVA  (Pt1/2 )  3 cm2. Compared to echo report from June 2024, mean gradient is  incresaed (5 to 8 mm Hg) Consider  TEE to evaluate further.. Trivial mitral valve regurgitation.   6. AV is thickened, calcified with restricted motion. Peak and mean  gradients through the valve are 59 and 37 mm Hg respectively.  Dimensionless index is 0.33 consistent with moderate AS.Marland Kitchen The aortic valve  is abnormal. Aortic valve regurgitation is not  visualized.   7. The inferior vena cava is normal in size with <50% respiratory  variability, suggesting right atrial pressure of 8 mmHg.   FINDINGS   Left Ventricle: Left ventricular ejection fraction, by estimation, is 70  to 75%. The left  ventricle has hyperdynamic function. The left ventricle  has no regional wall motion abnormalities. The left ventricular internal  cavity size was small. There is  moderate concentric left ventricular hypertrophy. Indeterminate diastolic  filling due to E-A fusion.   Right Ventricle: The right ventricular size is normal. Right vetricular  wall thickness was not assessed. Right ventricular systolic function is  mildly reduced.   Left Atrium: Left atrial size was severely dilated.   Right Atrium: Right atrial size was normal in size.   Pericardium: Large pericardial effusion surrounds heart, mainly  posteriorly and lateral to LV. Naxunak dimension is 5.6 cm posteriorly. It  has increased in size from echo done in June 2024. Does not meet criteria  for tamponade by echo criteria. Clinical  correlation indicated. A large pericardial effusion is present.   Mitral Valve: MV annulus is severely calcified. MV is thickened with  restricted motion. Difficult to see well Mean gradient through the MV is 8  mm Hg. MVA (Pt1/2 ) 3 cm2. Compared to echo report from June 2024, mean  gradient is incresaed (5 to 8 mm Hg)  Consider TEE to evaluate further. Trivial mitral valve regurgitation. MV  peak gradient, 13.2 mmHg. The mean mitral valve gradient is 8.0 mmHg.   Tricuspid Valve: The tricuspid valve is normal in structure. Tricuspid  valve regurgitation is mild.   Aortic Valve: AV is thickened, calcified with restricted motion. Peak and  mean gradients through the valve are 59 and 37 mm Hg respectively.  Dimensionless index is 0.33 consistent with moderate AS. The aortic valve  is abnormal. Aortic valve regurgitation   is not visualized. Aortic valve mean gradient measures 37.0 mmHg. Aortic  valve peak gradient measures 58.7 mmHg. Aortic valve area, by VTI measures  0.95 cm.   Pulmonic Valve: The pulmonic valve was not well visualized. Pulmonic valve  regurgitation is mild.   Aorta: The  aortic root and ascending aorta are structurally normal, with  no evidence of dilitation.   Venous: The inferior vena cava is normal in size with less than 50%  respiratory variability, suggesting right atrial pressure of 8 mmHg.   IAS/Shunts: No atrial level shunt detected by color flow Doppler.    Cardiac catheterization 04/25/2022  1.  Ectatic but patent coronary arteries with no obstructive disease.  Right dominant coronary artery. 2.  Mild pulmonary hypertension with PA pressure 52/15, mean 28 mmHg, transpulmonary gradient 10 mmHg, PVR approximately 2 Wood units. 3.  Moderate aortic stenosis with mean transvalvular gradient 16 mmHg calculated aortic valve area 1.16 cm 4.  Heavy mitral annular calcification by plain fluoroscopy 5.  Mild aortic valve calcification by plain fluoroscopy   Recommend: Continued medical therapy, clinical follow-up, echo surveillance.  The patient does not appear to have severe aortic stenosis and I  do not think she would benefit from TAVR at this time.   Diagnostic Dominance: Right  Intervention   Assessment & Plan   1.  Chronic diastolic CHF-bilateral lower extremity 1-2+ pitting edema right greater than left.  Generalized nonpitting left lower extremity edema.Geradine Girt nurse triage line on 08/04/2023 and reported 10-12 pound weight increase since 8/15.  Her furosemide was increased to 40 mg twice daily. Continue 60 mg twice daily furosemide x 4 days then resume 40 mg daily dosing Start potassium 20 mill equivalents x 4 days then stop Heart healthy low-sodium diet Continue lower extremity support stockings Continue daily weights BMP in 1 week  Essential hypertension-BP today 134/60. Maintain blood pressure log Continue current medical therapy Heart healthy low-sodium diet  Pulmonary hypertension-breathing at baseline.  Follows with [facility] pulmonary hypertension clinic. Using 3L of O2 in clinic today.  Continue current medical therapy  Aortic  stenosis-breathing stable.  Echocardiogram 06/05/2023 EF 70-75%, intermediate diastolic parameters, left atria severely dilated, aortic valve with peak and mean gradient of 59 and 37 mmHg, moderate AAS with no regurgitation. Plan for repeat echocardiogram 2/25.  CKD stage III-creatinine 1.290 on 08/03/23.  Avoid nephrotoxic agents. Follows with PCP  Disposition: Follow-up with Dr. Jens Som or me in 1 month.   Thomasene Ripple. Ramell Wacha NP-C     08/06/2023, 3:29 PM [facility] [address] Office [phone] Fax [phone]    I spent 14 minutes examining this patient, reviewing medications, and using patient centered shared decision making involving her cardiac care.   I spent greater than 20 minutes reviewing her past medical history,  medications, and prior cardiac tests.

## 2023-08-06 ENCOUNTER — Ambulatory Visit: Payer: Medicare PPO | Attending: General Practice | Admitting: General Practice

## 2023-08-06 ENCOUNTER — Encounter: Payer: Self-pay | Admitting: General Practice

## 2023-08-06 VITALS — BP 134/60 | HR 75 | Ht 63.0 in | Wt 108.2 lb

## 2023-08-06 DIAGNOSIS — I350 Nonrheumatic aortic (valve) stenosis: Secondary | ICD-10-CM

## 2023-08-06 DIAGNOSIS — I5032 Chronic diastolic (congestive) heart failure: Secondary | ICD-10-CM | POA: Diagnosis not present

## 2023-08-06 DIAGNOSIS — I272 Pulmonary hypertension, unspecified: Secondary | ICD-10-CM

## 2023-08-06 DIAGNOSIS — I10 Essential (primary) hypertension: Secondary | ICD-10-CM | POA: Diagnosis not present

## 2023-08-06 MED ORDER — POTASSIUM CHLORIDE CRYS ER 20 MEQ PO TBCR: 20.0000 meq | EXTENDED_RELEASE_TABLET | Freq: Every day | ORAL | 0 refills | Status: DC

## 2023-08-06 MED ORDER — FUROSEMIDE 20 MG PO TABS: 40.0000 mg | ORAL_TABLET | Freq: Every day | ORAL | 1 refills | Status: DC

## 2023-08-06 NOTE — Patient Instructions (Signed)
Medication Instructions:  TAKE LASIX 60MG  x4 DAYS THEN BACK TO 40MG  TAKE POTASSIUM 20MG  x4 DAYS THEN STOP *If you need a refill on your cardiac medications before your next appointment, please call your pharmacy*  Lab Work: BMET NEXT WEEK If you have labs (blood work) drawn today and your tests are completely normal, you will receive your results only by:    MyChart Message (if you have MyChart) OR  A paper copy in the mail If you have any lab test that is abnormal or we need to change your treatment, we will call you to review the results.  Other Instructions FLUID RESTRICTION <48 OUNCES DAILY ELEVATE LEGS WHEN NOT ACTIVE MAY TAKE PILLS WITH APPLESAUCE (POTASSIUM)  Follow-Up: At [facility], you and your health needs are our priority.  As part of our continuing mission to provide you with exceptional heart care, we have created designated Provider Care Teams.  These Care Teams include your primary Cardiologist (physician) and Advanced Practice Providers (APPs -  Physician Assistants and Nurse Practitioners) who all work together to provide you with the care you need, when you need it.  Your next appointment:   KEEP SCHEDULED APPOINTMENT   Provider:   Edd Fabian, FNP-C     [employer]  Outlet Store  Performance Legwear for [facility] Mailing Address:  [address];   [address]  [place], [place] [address]  Tel [phone] Fx [phone]     High Quality Legwear for Today's Active Lifestyles Maximum Compression at the ankle. Compression lessens gradually up the leg.   We manufacture a wide range of compression hosiery for men and women in  different styles, constructions and levels of support.  How Compression Hosiery Works Regulatory affairs officer, [employer]. compression hosiery works by applying graduated pressure to the  muscles and veins in the legs.  When the calf muscle contracts such as during walking  the compression hosiery will "give" and then  return to its original position. By doing so  the hosiery is assists your body's circulatory wellness.  The result is increased leg health and vitality.   Maximum Compression at the ankle Compression lessens gradually up the leg  We Offer: Sheer & Opaque Stockings       COLORS:  Nude, black, white and misc. prints Below Knee Thigh High Pantyhose  High Quality Legwear for Today's Active Lifestyles We manufacture a wide range of compression hosiery for men and women in different styles, construction sand levels of support.  Socks:                     Sheer & Opaque   Compression Levels Include:                                  Stockings Men's               Below Knee                8-15 mmHg   Women's         Thigh High                 15-20 mmHg  Unisex             Pantyhose                 20-30 mmHg  30-40 mmHg  4 Simple Ways to Order   Email  [email or web address] Mail/Email orders are subject to processing and handling charges. Allow 7-10 days for receipt.  Phone [phone]  Please allow 24 hours for return call.   In Person  We recommend calling prior to your visit to confirm store hours as they may change due to holiday, weather, and maintenance.   By Mail When placing an order, please have the following information available. Our representatives are available to assist.     Measurements    THIGH      in.   CALF        in.   ANKLE     in.    Compression  8-15 mmHg >>15-20 mmHg**   20-30 mmHg 30-40 mmHg   WOMEN'S MEN'S  Shoe Size Sock Size Shoe Size Sock Size  4 - 5 Small 7.5 and Under Small  5.5 - 7.5 Medium 8 - 10 Medium  8 - 10 Large 10.5 - 12 Large  10.5 and Over X-Large 12.5 and Over X-Large   Knee High Size Chart  Length from CALF MEASUREMENT  floor to bend   in knee. 11" 12" 13" 14" 15" 16" 17" 18" 19" 20" 21" 22"  14" S S S S M M L L L XL XL XL  15" S S S M M L L L XL XL XXL XXL   16" S S M M M L L L XL XXL XXL XXL  17" S M M M M L L XL XL XXL XXL XXL  18" M M M M L L L XL XL XXL XXL XXL  19" M M M M L L XL XL XL XXL XXL XXL   Thigh High Circumference Sizing Chart                 S M L XL XXL  ANKLE 6.5" - 8" 8" - 9.5" 9.5" - 11" 11" - 12.5" 12.5" - 14"  CALF 10.5" - 14.5" 11.5" - 15.5" 12.5" - 17" 13.5" - 17.5" 14.5" - 19.5"  THIGH 15.5" - 22" 17.5" - 24" 19.5" - 26" 22" - 28" 26" - 32"  HIP UP TO 40" UP TO 44" UP TO 48' UP TO 52" UP TO 56"   Pantyhose Size Chart  Height Petite Medium Tall X-Tall Queen Queen +   Weight Weight Weight Weight Weight Weight  4'11" 95-130 135      5'0 95-125 130-145   170-185   5'1" 90-120 125-155 160-165  170-195   5'2" 90-115 120-145 150-165  170-195   5'3" 90-110 115-140 145-165  170-200 200-225  5'4" 100-105 110-135 140-160 165 170-200 200-225  5'5" 100 105-130 135-160 165 170-200 200-225  5'6"  110-125 130-155 160-165 170-200 200-225  5'7"  110-120 125-150 155-165 170-200 195-225  5'8"   120-145 150-165 170-200 190-225  5'9"   125-140 145-170 175-190 185-220  5'10"   125-135 140-185  185-215  5'11"   130-135 140-185  190-210

## 2023-08-11 ENCOUNTER — Other Ambulatory Visit: Payer: Self-pay | Admitting: Internal Medicine

## 2023-08-11 NOTE — Telephone Encounter (Signed)
I signed this today.

## 2023-08-13 ENCOUNTER — Other Ambulatory Visit: Payer: Self-pay | Admitting: Radiology

## 2023-08-13 ENCOUNTER — Inpatient Hospital Stay (HOSPITAL_COMMUNITY)
Admission: EM | Admit: 2023-08-13 | Discharge: 2023-08-15 | DRG: 602 | Disposition: A | Discharge status: Home / Self Care | Payer: Medicare PPO | Attending: Internal Medicine | Admitting: Internal Medicine

## 2023-08-13 ENCOUNTER — Inpatient Hospital Stay (HOSPITAL_COMMUNITY): Payer: Medicare PPO

## 2023-08-13 ENCOUNTER — Other Ambulatory Visit: Payer: Medicare PPO

## 2023-08-13 ENCOUNTER — Emergency Department (HOSPITAL_COMMUNITY): Payer: Medicare PPO

## 2023-08-13 ENCOUNTER — Other Ambulatory Visit: Payer: Self-pay

## 2023-08-13 ENCOUNTER — Encounter (HOSPITAL_COMMUNITY): Payer: Self-pay | Admitting: Emergency Medicine

## 2023-08-13 DIAGNOSIS — Z95828 Presence of other vascular implants and grafts: Secondary | ICD-10-CM | POA: Diagnosis not present

## 2023-08-13 DIAGNOSIS — M349 Systemic sclerosis, unspecified: Secondary | ICD-10-CM | POA: Diagnosis not present

## 2023-08-13 DIAGNOSIS — R0989 Other specified symptoms and signs involving the circulatory and respiratory systems: Secondary | ICD-10-CM | POA: Diagnosis not present

## 2023-08-13 DIAGNOSIS — Z801 Family history of malignant neoplasm of trachea, bronchus and lung: Secondary | ICD-10-CM | POA: Diagnosis not present

## 2023-08-13 DIAGNOSIS — J9611 Chronic respiratory failure with hypoxia: Secondary | ICD-10-CM | POA: Diagnosis not present

## 2023-08-13 DIAGNOSIS — Z820 Family history of epilepsy and other diseases of the nervous system: Secondary | ICD-10-CM

## 2023-08-13 DIAGNOSIS — M7989 Other specified soft tissue disorders: Secondary | ICD-10-CM

## 2023-08-13 DIAGNOSIS — Z86711 Personal history of pulmonary embolism: Secondary | ICD-10-CM | POA: Diagnosis not present

## 2023-08-13 DIAGNOSIS — R0602 Shortness of breath: Secondary | ICD-10-CM | POA: Diagnosis not present

## 2023-08-13 DIAGNOSIS — K219 Gastro-esophageal reflux disease without esophagitis: Secondary | ICD-10-CM | POA: Diagnosis present

## 2023-08-13 DIAGNOSIS — Z800 Family history of malignant neoplasm of digestive organs: Secondary | ICD-10-CM

## 2023-08-13 DIAGNOSIS — Z79899 Other long term (current) drug therapy: Secondary | ICD-10-CM

## 2023-08-13 DIAGNOSIS — I2721 Secondary pulmonary arterial hypertension: Secondary | ICD-10-CM | POA: Diagnosis present

## 2023-08-13 DIAGNOSIS — I10 Essential (primary) hypertension: Secondary | ICD-10-CM | POA: Diagnosis present

## 2023-08-13 DIAGNOSIS — Z882 Allergy status to sulfonamides status: Secondary | ICD-10-CM

## 2023-08-13 DIAGNOSIS — Z85528 Personal history of other malignant neoplasm of kidney: Secondary | ICD-10-CM | POA: Diagnosis not present

## 2023-08-13 DIAGNOSIS — D631 Anemia in chronic kidney disease: Secondary | ICD-10-CM | POA: Diagnosis present

## 2023-08-13 DIAGNOSIS — Z885 Allergy status to narcotic agent status: Secondary | ICD-10-CM

## 2023-08-13 DIAGNOSIS — M79604 Pain in right leg: Secondary | ICD-10-CM | POA: Diagnosis not present

## 2023-08-13 DIAGNOSIS — Z833 Family history of diabetes mellitus: Secondary | ICD-10-CM | POA: Diagnosis not present

## 2023-08-13 DIAGNOSIS — N189 Chronic kidney disease, unspecified: Secondary | ICD-10-CM | POA: Diagnosis not present

## 2023-08-13 DIAGNOSIS — I130 Hypertensive heart and chronic kidney disease with heart failure and stage 1 through stage 4 chronic kidney disease, or unspecified chronic kidney disease: Secondary | ICD-10-CM | POA: Diagnosis present

## 2023-08-13 DIAGNOSIS — E871 Hypo-osmolality and hyponatremia: Secondary | ICD-10-CM | POA: Diagnosis not present

## 2023-08-13 DIAGNOSIS — J811 Chronic pulmonary edema: Secondary | ICD-10-CM | POA: Diagnosis not present

## 2023-08-13 DIAGNOSIS — I350 Nonrheumatic aortic (valve) stenosis: Secondary | ICD-10-CM | POA: Diagnosis not present

## 2023-08-13 DIAGNOSIS — J9612 Chronic respiratory failure with hypercapnia: Secondary | ICD-10-CM | POA: Diagnosis present

## 2023-08-13 DIAGNOSIS — D649 Anemia, unspecified: Secondary | ICD-10-CM | POA: Diagnosis not present

## 2023-08-13 DIAGNOSIS — J8410 Pulmonary fibrosis, unspecified: Secondary | ICD-10-CM | POA: Diagnosis present

## 2023-08-13 DIAGNOSIS — R918 Other nonspecific abnormal finding of lung field: Secondary | ICD-10-CM | POA: Diagnosis not present

## 2023-08-13 DIAGNOSIS — Z9981 Dependence on supplemental oxygen: Secondary | ICD-10-CM

## 2023-08-13 DIAGNOSIS — Z91041 Radiographic dye allergy status: Secondary | ICD-10-CM

## 2023-08-13 DIAGNOSIS — Z853 Personal history of malignant neoplasm of breast: Secondary | ICD-10-CM

## 2023-08-13 DIAGNOSIS — I517 Cardiomegaly: Secondary | ICD-10-CM | POA: Insufficient documentation

## 2023-08-13 DIAGNOSIS — N1832 Chronic kidney disease, stage 3b: Secondary | ICD-10-CM | POA: Diagnosis not present

## 2023-08-13 DIAGNOSIS — M341 CR(E)ST syndrome: Secondary | ICD-10-CM | POA: Diagnosis not present

## 2023-08-13 DIAGNOSIS — L0390 Cellulitis, unspecified: Principal | ICD-10-CM | POA: Diagnosis present

## 2023-08-13 DIAGNOSIS — D638 Anemia in other chronic diseases classified elsewhere: Secondary | ICD-10-CM

## 2023-08-13 DIAGNOSIS — J849 Interstitial pulmonary disease, unspecified: Secondary | ICD-10-CM | POA: Diagnosis not present

## 2023-08-13 DIAGNOSIS — L03115 Cellulitis of right lower limb: Principal | ICD-10-CM | POA: Diagnosis present

## 2023-08-13 DIAGNOSIS — I5032 Chronic diastolic (congestive) heart failure: Secondary | ICD-10-CM | POA: Diagnosis present

## 2023-08-13 DIAGNOSIS — D539 Nutritional anemia, unspecified: Secondary | ICD-10-CM | POA: Diagnosis not present

## 2023-08-13 DIAGNOSIS — Z888 Allergy status to other drugs, medicaments and biological substances status: Secondary | ICD-10-CM

## 2023-08-13 DIAGNOSIS — Z86718 Personal history of other venous thrombosis and embolism: Secondary | ICD-10-CM | POA: Diagnosis not present

## 2023-08-13 DIAGNOSIS — I5033 Acute on chronic diastolic (congestive) heart failure: Secondary | ICD-10-CM | POA: Diagnosis not present

## 2023-08-13 DIAGNOSIS — Z8052 Family history of malignant neoplasm of bladder: Secondary | ICD-10-CM

## 2023-08-13 DIAGNOSIS — I3139 Other pericardial effusion (noninflammatory): Secondary | ICD-10-CM | POA: Diagnosis present

## 2023-08-13 DIAGNOSIS — N183 Chronic kidney disease, stage 3 unspecified: Secondary | ICD-10-CM | POA: Diagnosis present

## 2023-08-13 DIAGNOSIS — Z9049 Acquired absence of other specified parts of digestive tract: Secondary | ICD-10-CM | POA: Diagnosis not present

## 2023-08-13 DIAGNOSIS — I272 Pulmonary hypertension, unspecified: Secondary | ICD-10-CM | POA: Diagnosis not present

## 2023-08-13 DIAGNOSIS — E538 Deficiency of other specified B group vitamins: Secondary | ICD-10-CM | POA: Diagnosis not present

## 2023-08-13 DIAGNOSIS — I509 Heart failure, unspecified: Secondary | ICD-10-CM | POA: Diagnosis not present

## 2023-08-13 LAB — CBC WITH DIFFERENTIAL/PLATELET
Abs Immature Granulocytes: 0.02 10*3/uL (ref 0.00–0.07)
Basophils Absolute: 0 10*3/uL (ref 0.0–0.1)
Basophils Relative: 0 %
Eosinophils Absolute: 0.3 10*3/uL (ref 0.0–0.5)
Eosinophils Relative: 6 %
HCT: 30.1 % — ABNORMAL LOW (ref 36.0–46.0)
Hemoglobin: 9.4 g/dL — ABNORMAL LOW (ref 12.0–15.0)
Immature Granulocytes: 0 %
Lymphocytes Relative: 12 %
Lymphs Abs: 0.7 10*3/uL (ref 0.7–4.0)
MCH: 35.5 pg — ABNORMAL HIGH (ref 26.0–34.0)
MCHC: 31.2 g/dL (ref 30.0–36.0)
MCV: 113.6 fL — ABNORMAL HIGH (ref 80.0–100.0)
Monocytes Absolute: 0.3 10*3/uL (ref 0.1–1.0)
Monocytes Relative: 5 %
Neutro Abs: 4.3 10*3/uL (ref 1.7–7.7)
Neutrophils Relative %: 77 %
Platelets: 170 10*3/uL (ref 150–400)
RBC: 2.65 MIL/uL — ABNORMAL LOW (ref 3.87–5.11)
RDW: 13 % (ref 11.5–15.5)
WBC: 5.6 10*3/uL (ref 4.0–10.5)
nRBC: 0 % (ref 0.0–0.2)

## 2023-08-13 LAB — HEPATIC FUNCTION PANEL
ALT: 13 U/L (ref 0–44)
AST: 24 U/L (ref 15–41)
Albumin: 3.9 g/dL (ref 3.5–5.0)
Alkaline Phosphatase: 69 U/L (ref 38–126)
Bilirubin, Direct: 0.1 mg/dL (ref 0.0–0.2)
Indirect Bilirubin: 0.5 mg/dL (ref 0.3–0.9)
Total Bilirubin: 0.6 mg/dL (ref 0.3–1.2)
Total Protein: 6.6 g/dL (ref 6.5–8.1)

## 2023-08-13 LAB — BASIC METABOLIC PANEL
Anion gap: 9 (ref 5–15)
BUN: 52 mg/dL — ABNORMAL HIGH (ref 8–23)
CO2: 30 mmol/L (ref 22–32)
Calcium: 8.5 mg/dL — ABNORMAL LOW (ref 8.9–10.3)
Chloride: 91 mmol/L — ABNORMAL LOW (ref 98–111)
Creatinine, Ser: 1.51 mg/dL — ABNORMAL HIGH (ref 0.44–1.00)
GFR, Estimated: 34 mL/min — ABNORMAL LOW (ref >60)
Glucose, Bld: 99 mg/dL (ref 70–99)
Potassium: 4.1 mmol/L (ref 3.5–5.1)
Sodium: 130 mmol/L — ABNORMAL LOW (ref 135–145)

## 2023-08-13 LAB — RETICULOCYTES
Immature Retic Fract: 9 % (ref 2.3–15.9)
RBC.: 2.57 MIL/uL — ABNORMAL LOW (ref 3.87–5.11)
Retic Count, Absolute: 38.8 10*3/uL (ref 19.0–186.0)
Retic Ct Pct: 1.5 % (ref 0.4–3.1)

## 2023-08-13 LAB — SEDIMENTATION RATE: Sed Rate: 29 mm/h — ABNORMAL HIGH (ref 0–22)

## 2023-08-13 LAB — OSMOLALITY: Osmolality: 308 mosm/kg — ABNORMAL HIGH (ref 275–295)

## 2023-08-13 LAB — IRON AND TIBC
Iron: 62 ug/dL (ref 28–170)
Saturation Ratios: 30 % (ref 10.4–31.8)
TIBC: 210 ug/dL — ABNORMAL LOW (ref 250–450)
UIBC: 148 ug/dL

## 2023-08-13 LAB — C-REACTIVE PROTEIN: CRP: 0.7 mg/dL (ref <1.0)

## 2023-08-13 LAB — CK: Total CK: 107 U/L (ref 38–234)

## 2023-08-13 LAB — FERRITIN: Ferritin: 810 ng/mL — ABNORMAL HIGH (ref 11–307)

## 2023-08-13 MED ORDER — LACTATED RINGERS IV BOLUS: 1000.0000 mL | Freq: Once | INTRAVENOUS | Status: DC

## 2023-08-13 MED ORDER — FUROSEMIDE 10 MG/ML IJ SOLN: 20.0000 mg | Freq: Once | INTRAMUSCULAR | Status: DC

## 2023-08-13 MED ORDER — FUROSEMIDE 10 MG/ML IJ SOLN: 20.0000 mg | Freq: Every day | INTRAMUSCULAR | Status: DC

## 2023-08-13 MED ORDER — SODIUM CHLORIDE 0.9 % IV SOLN
1.0000 g | Freq: Once | INTRAVENOUS | Status: AC
  Administered 2023-08-13: 1 g via INTRAVENOUS
  Filled 2023-08-13: qty 10

## 2023-08-13 NOTE — Assessment & Plan Note (Signed)
 Obtain anemia panel  Transfuse for Hg <7 , rapidly dropping or  if symptomatic

## 2023-08-13 NOTE — Assessment & Plan Note (Signed)
Patient has chronic changes of her right lower extremity unclear if current situation is actually infectious Dopplers negative for DVT patient does have elevated sed rate but that is also in the setting of crest Plain images unremarkable except for edema. Cover for tonight with Rocephin and see if there is any improvement

## 2023-08-13 NOTE — Subjective & Objective (Signed)
3 weeks of worsening right leg pain and swelling subjective fever no chills Has hx of wounds followed by podiatry in the past, has not been able to tolerate weight baring on this leg since she had a fracture 1 year Had a hx of DVT no on AC due to gi bleed Sp IV filter

## 2023-08-13 NOTE — Assessment & Plan Note (Signed)
Scan showing somewhat enlarged silhouette.  Patient reports somewhat increased shortness of breath repeat echogram monitor for any sign of pericardial effusion

## 2023-08-13 NOTE — Assessment & Plan Note (Signed)
Chronic ongoing With multiple organ involvement

## 2023-08-13 NOTE — Assessment & Plan Note (Addendum)
Possibly acute on chronic CHF exacerbation given need for higher oxygen patient had to dial up from 2 to 3 L as well as chest x-ray findings. Fluid status is overall somewhat difficult to ascertain Patient has chronic crackles because of IDL and pulmonary fibrosis Repeat echo Obtain troponins Attempt to  diurese and see if it improves patient's status Lasix 40 mg IV BID

## 2023-08-13 NOTE — ED Provider Notes (Signed)
[place] EMERGENCY DEPARTMENT AT [facility] Provider Note   CSN: [id number] Arrival date & time: 08/13/23  1645     History Chief Complaint  Patient presents with   Leg Pain    HPI [facility] is a 87 y.o. female presenting for acute on chronic right leg pain.  History of foot wounds follows with podiatry multiple interventions in the past. Erythema has spread up the right leg over the last 3 weeks.  Started having confusion weakness multiple near falls.  Family endorses chills with no obvious fevers. History of DVTs but failed anticoagulation therapy complicated by multiple GI bleeding events. Today, she endorses feeling too weak to get up and walk substantial leg redness and pain.  Patient's recorded medical, surgical, social, medication list and allergies were reviewed in the Snapshot window as part of the initial history.   Review of Systems   Review of Systems  Constitutional:  Positive for fatigue and fever. Negative for chills.  HENT:  Negative for ear pain and sore throat.   Eyes:  Negative for pain and visual disturbance.  Respiratory:  Negative for cough and shortness of breath.   Cardiovascular:  Negative for chest pain and palpitations.  Gastrointestinal:  Negative for abdominal pain and vomiting.  Genitourinary:  Negative for dysuria and hematuria.  Musculoskeletal:  Negative for arthralgias and back pain.  Skin:  Positive for color change and rash.  Neurological:  Positive for weakness. Negative for seizures and syncope.  All other systems reviewed and are negative.   Physical Exam Updated Vital Signs BP 132/71   Pulse 81   Temp 98.4 F (36.9 C) (Oral)   Resp (!) 22   Ht 5\' 3"  (1.6 m)   Wt 49 kg   SpO2 100%   BMI 19.14 kg/m  Physical Exam Constitutional:      General: She is not in acute distress.    Appearance: She is not ill-appearing or toxic-appearing.  HENT:     Head: Normocephalic and atraumatic.  Eyes:     Extraocular  Movements: Extraocular movements intact.     Pupils: Pupils are equal, round, and reactive to light.  Cardiovascular:     Rate and Rhythm: Normal rate.  Pulmonary:     Effort: No respiratory distress.  Abdominal:     General: Abdomen is flat.  Musculoskeletal:        General: No swelling, deformity or signs of injury.     Cervical back: Normal range of motion. No rigidity.  Skin:    General: Skin is warm and dry.     Findings: Erythema present.  Neurological:     General: No focal deficit present.     Mental Status: She is alert and oriented to person, place, and time.  Psychiatric:        Mood and Affect: Mood normal.      ED Course/ Medical Decision Making/ A&P    Procedures Procedures   Medications Ordered in ED Medications  lactated ringers bolus 1,000 mL (has no administration in time range)  furosemide (LASIX) injection 20 mg (has no administration in time range)  furosemide (LASIX) injection 20 mg (has no administration in time range)  cefTRIAXone (ROCEPHIN) 1 g in sodium chloride 0.9 % 100 mL IVPB (0 g Intravenous Stopped 08/13/23 2202)   Patient's history of present illness physicals and findings most consistent with either DVT or cellulitis.  Will evaluate with ultrasound series as well as screening lab work.  Reassessment: Reviewed  ultrasound series does not show DVT.  Lab work without leukocytosis but she does have positive ESR. Went to get patient ambulate and she states that she feels too groggy and weak to ambulate and does not feel comfortable at home.  I think the cellulitis is tracking from her chronic foot wounds. Will treat with antibiotics for cellulitis given reported chills admit to medicine for ongoing care management.  Disposition:   Based on the above findings, I believe this patient is stable for admission.    Patient/family educated about specific findings on our evaluation and explained exact reasons for admission.  Patient/family educated  about clinical situation and time was allowed to answer questions.   Admission team communicated with and agreed with need for admission. Patient admitted. Patient  ready to move at this time.     Emergency Department Medication Summary:   Medications  lactated ringers bolus 1,000 mL (has no administration in time range)  furosemide (LASIX) injection 20 mg (has no administration in time range)  furosemide (LASIX) injection 20 mg (has no administration in time range)  cefTRIAXone (ROCEPHIN) 1 g in sodium chloride 0.9 % 100 mL IVPB (0 g Intravenous Stopped 08/13/23 2202)        Clinical Impression: No diagnosis found.   Admit   Final Clinical Impression(s) / ED Diagnoses Final diagnoses:  None    Rx / DC Orders ED Discharge Orders     None         Glyn Ade, MD 08/13/23 2309

## 2023-08-13 NOTE — Assessment & Plan Note (Signed)
No longer on anticoagulation status post IVC filter

## 2023-08-13 NOTE — Progress Notes (Signed)
Lower extremity venous duplex completed. Please see CV Procedures for preliminary results.  Shona Simpson, RVT 08/13/23 7:19 PM

## 2023-08-13 NOTE — H&P (Signed)
Referring Physician(s): Briant Cedar  Supervising Physician: Oley Balm  Patient Status:  [facility] OP  Chief Complaint:  "I'm here for a bone marrow biopsy"  Subjective: Patient known to IR team from cryoablation and biopsy of left renal tumor in 2012.  She is an 87 year old female with past medical history significant for angiodysplasia of stomach and duodenum, AVM, remote left breast cancer, chronic kidney disease, Crest syndrome, GERD, interstitial lung disease, hypertension , moderate aortic stenosis, vitamin B12 deficiency ,prior IVC filter placement, pericardial effusion, PE 2003, pulmonary hypertension, renal cell carcinoma, scleroderma, CHF, and chronic macrocytic anemia of uncertain etiology.  She was originally scheduled as an outpatient for bone marrow biopsy today however was admitted last night with right lower extremity pain/ cellulitis/?CHF exacerbation ; venous Doppler study was negative.  Following discussion with oncology plan is to proceed with bone marrow biopsy this morning.She currently denies fever, chest pain, cough, abdominal pain, back pain, nausea, vomiting or bleeding.  She does have some dyspnea and urinary incontinence.   Past Medical History:  Diagnosis Date   Anemia    Angiodysplasia of stomach and duodenum    Arthus phenomenon    AVM (arteriovenous malformation)    Blood transfusion without reported diagnosis    last 4 units 12-22-15, Iron infusion x2 last -01-07-16,01-14-16.   Breast cancer (HCC) 1989   Left   Candida esophagitis (HCC)    Cataract    Chronic kidney disease    Chronic mild renal insuffiency   Corneal edema    Corneal epithelial basement membrane dystrophy    CREST syndrome (HCC)    GERD (gastroesophageal reflux disease)    w/ HH   Interstitial lung disease (HCC)    Nodule of kidney    Pericardial effusion    PONV (postoperative nausea and vomiting)    Pulmonary embolus (HCC) 2003   Pulmonary hypertension (HCC)    followed by  Dr Bland Span at [facility], now Dr. Penni Bombard visit end 2'17.   Rectal incontinence    Renal cell carcinoma (HCC)    Scleroderma (HCC)    Tubular adenoma of colon    Uterine polyp    Past Surgical History:  Procedure Laterality Date   APPENDECTOMY  1962   BREAST LUMPECTOMY  1989   left   CATARACT EXTRACTION, BILATERAL Bilateral 12/2013   COLONOSCOPY WITH PROPOFOL N/A 04/15/2021   Procedure: COLONOSCOPY WITH PROPOFOL;  Surgeon: Rachael Fee, MD;  Location: [facility] END[facility];  Service: Endoscopy;  Laterality: N/A;   ENTEROSCOPY N/A 01/18/2016   Procedure: ENTEROSCOPY;  Surgeon: Napoleon Form, MD;  Location: [facility];  Service: Endoscopy;  Laterality: N/A;   ENTEROSCOPY N/A 05/24/2018   Procedure: ENTEROSCOPY;  Surgeon: Lynann Bologna, MD;  Location: [facility];  Service: Endoscopy;  Laterality: N/A;   ENTEROSCOPY N/A 03/29/2021   Procedure: ENTEROSCOPY;  Surgeon: Beverley Fiedler, MD;  Location: [facility];  Service: Gastroenterology;  Laterality: N/A;   ENTEROSCOPY N/A 04/13/2021   Procedure: ENTEROSCOPY;  Surgeon: Benancio Deeds, MD;  Location: [facility];  Service: Gastroenterology;  Laterality: N/A;   ESOPHAGOGASTRODUODENOSCOPY (EGD) WITH PROPOFOL N/A 12/21/2015   Procedure: ESOPHAGOGASTRODUODENOSCOPY (EGD) WITH PROPOFOL;  Surgeon: Hilarie Fredrickson, MD;  Location: [facility];  Service: Endoscopy;  Laterality: N/A;   HOT HEMOSTASIS N/A 05/24/2018   Procedure: HOT HEMOSTASIS (ARGON PLASMA COAGULATION/BICAP);  Surgeon: Lynann Bologna, MD;  Location: [facility];  Service: Endoscopy;  Laterality: N/A;   HOT HEMOSTASIS N/A 03/29/2021   Procedure: HOT HEMOSTASIS (ARGON PLASMA  COAGULATION/BICAP);  Surgeon: Beverley Fiedler, MD;  Location: [facility];  Service: Gastroenterology;  Laterality: N/A;   HOT HEMOSTASIS N/A 04/13/2021   Procedure: HOT HEMOSTASIS (ARGON PLASMA COAGULATION/BICAP);  Surgeon: Benancio Deeds, MD;  Location: [facility];  Service: Gastroenterology;  Laterality: N/A;    INTRAMEDULLARY (IM) NAIL INTERTROCHANTERIC Left 10/11/2022   Procedure: INTRAMEDULLARY (IM) NAIL INTERTROCHANTERIC;  Surgeon: Ollen Gross, MD;  Location: [facility];  Service: Orthopedics;  Laterality: Left;   IR GENERIC HISTORICAL  06/05/2016   IR RADIOLOGIST EVAL & MGMT 06/05/2016 [name], MD [facility] INTERV RAD   IVC Filter     KIDNEY SURGERY     left -"laser surgery by Dr. Fredia Sorrow- 5 yrs ago" no removal   RIGHT/LEFT HEART CATH AND CORONARY ANGIOGRAPHY N/A 04/25/2022   Procedure: RIGHT/LEFT HEART CATH AND CORONARY ANGIOGRAPHY;  Surgeon: Tonny Bollman, MD;  Location: [facility] INVASIVE CV LAB;  Service: Cardiovascular;  Laterality: N/A;   SCHLEROTHERAPY  05/24/2018   Procedure: Theresia Majors;  Surgeon: Lynann Bologna, MD;  Location: [facility];  Service: Endoscopy;;   SUBMUCOSAL TATTOO INJECTION  04/13/2021   Procedure: SUBMUCOSAL TATTOO INJECTION;  Surgeon: Benancio Deeds, MD;  Location: [facility];  Service: Gastroenterology;;   TONSILLECTOMY     TUBAL LIGATION        Allergies: Codeine, Other, Erythromycin, Iodinated contrast media, Lisinopril, Mycophenolate mofetil, and Sulfa antibiotics  Medications: Prior to Admission medications   Medication Sig Start Date End Date Taking? Authorizing Provider  acetaminophen (TYLENOL) 325 MG tablet Take 2 tablets (650 mg total) by mouth every 6 (six) hours as needed for moderate pain. 07/18/22   Wallis Bamberg, PA-C  albuterol (VENTOLIN HFA) 108 (90 Base) MCG/ACT inhaler INHALE 2 PUFFS INTO THE LUNGS EVERY 6 HOURS AS NEEDED FOR WHEEZING OR SHORTNESS OF BREATH 03/13/23   [name], MD  augmented betamethasone dipropionate (DIPROLENE-AF) 0.05 % cream Apply topically 2 (two) times daily as needed. 07/13/23   [provider]  clobetasol ointment (TEMOVATE) 0.05 % Apply 1 Application topically 2 (two) times daily. 04/20/23   [provider]  Cyanocobalamin (VITAMIN B-12) 1000 MCG SUBL Place 1,000 mcg under the tongue.  Taking twice a week    [provider]  docusate sodium (COLACE) 100 MG capsule Take 1 capsule (100 mg total) by mouth 2 (two) times daily. Patient taking differently: Take 100 mg by mouth 2 (two) times daily. Pt takes every morning 10/15/22   Edmisten, Kristie L, PA  famotidine (PEPCID) 20 MG tablet Take 1 tablet (20 mg total) by mouth at bedtime. 02/10/23   Napoleon Form, MD  fluticasone (FLONASE) 50 MCG/ACT nasal spray USE 1 SPRAY(S) IN EACH NOSTRIL ONCE DAILY AS NEEDED FOR ALLERGIES 08/11/23   [name], MD  furosemide (LASIX) 20 MG tablet Take 2 tablets (40 mg total) by mouth daily. TAKE 60MG  x4 DAYS THEN BACK TO 40MG  08/06/23   Cleaver, Thomasene Ripple, NP  guaiFENesin (MUCINEX) 600 MG 12 hr tablet Take 300 mg by mouth daily.    [provider]  ipratropium (ATROVENT) 0.03 % nasal spray Place 2 sprays into both nostrils every 12 (twelve) hours. 05/12/22   Martina Sinner, MD  latanoprost (XALATAN) 0.005 % ophthalmic solution Place 1 drop into both eyes at bedtime. 09/23/22   [provider]  magic mouthwash SOLN Take 10 mLs by mouth 4 (four) times daily as needed for mouth pain. 03/22/22   Burnadette Pop, MD  mirtazapine (REMERON) 15 MG tablet Take 1 tablet (  15 mg total) by mouth at bedtime. 03/13/23   [name], MD  montelukast (SINGULAIR) 10 MG tablet TAKE 1 TABLET BY MOUTH AT BEDTIME 07/31/23   [name], MD  Multiple Vitamin (MULTIVITAMIN) capsule Take 1 capsule by mouth daily.    [provider]  mupirocin ointment (BACTROBAN) 2 % Apply 1 Application topically 2 (two) times daily as needed (dry skin).    [provider]  OPSUMIT 10 MG tablet Take 1 tablet (10 mg total) by mouth daily. 07/07/23   Parrett, Virgel Bouquet, NP  OXYGEN Inhale into the lungs. 2 LMP at night and upon exertion    [provider]  pantoprazole (PROTONIX) 40 MG tablet Take 1 tablet (40 mg total) by mouth daily. 02/10/23   Napoleon Form, MD  polyethylene glycol (MIRALAX / GLYCOLAX) 17 g packet Take 17 g by mouth daily. 10/15/22   Edmisten, Kristie L, PA  potassium chloride SA (KLOR-CON M) 20 MEQ tablet Take 1 tablet (20 mEq total) by mouth daily. 08/06/23   Ronney Asters, NP  spironolactone (ALDACTONE) 25 MG tablet Take 1 tablet (25 mg total) by mouth daily. 08/03/23   Gwenlyn Fudge, FNP  sucralfate (CARAFATE) 1 GM/10ML suspension TAKE 10 MLS BY MOUTH EVERY 6 HOURS AS NEEDED FOR REFLUX 02/11/23   Napoleon Form, MD  triamcinolone cream (KENALOG) 0.1 % Apply 1 Application topically 2 (two) times daily. 07/07/23   [provider]     Vital Signs: Temp 97.6, blood pressure 134/64, heart rate 79, respirations 18, O2 sat 96% 2 L nasal cannula    Code Status: DNR  Physical Exam: Awake, alert.  Chest with diminished breath sounds bases.  Heart with RRR, +murmur; abd-soft,+BS,NT; no sig LE edema, mid,erythema rt knee cap to ankle  Imaging: No results found.  Labs:  CBC: Recent Labs    06/04/23 1713 06/16/23 1323 06/30/23 1435 07/22/23 1214  WBC 6.1 5.2 4.9 4.9  HGB 9.4* 9.1* 9.6* 9.5*  HCT 30.0* 27.8* 28.5* 29.7*  PLT 172 211.0 171 167    COAGS: Recent Labs    10/10/22 1828 10/10/22 2346  INR 1.1 1.1    BMP: Recent Labs    06/04/23 1713 06/04/23 2353 06/06/23 0230 06/12/23 1135 06/16/23 1323 06/30/23 1435 08/03/23 1417  NA 133* 133* 132* 139 134* 134* 135  K 4.7 4.4 3.8 4.3 4.3 4.4 4.4  CL 96* 94* 91* 96 94* 95* 93*  CO2 24 30 30 29  33* 33* 35*  GLUCOSE 92 107* 91 88 94 100* 89  BUN 34* 33* 33* 44* 44* 37* 42*  CALCIUM 8.9 9.3 8.6* 9.2 9.3 9.2 9.2  CREATININE 1.26* 1.02* 1.07* 1.32* 1.21* 1.32* 1.29*  GFRNONAA 42* 54* 51*  --   --  40*  --     LIVER FUNCTION TESTS: Recent Labs    04/16/23 1431 06/03/23 1200 06/06/23 0230 06/16/23 1323 06/30/23 1435  BILITOT 0.7 0.6  --  0.6 0.7  AST 22 19  --  18 20  ALT 10 8  --  9 7  ALKPHOS 68 62  --  61 66  PROT 6.7 6.7  --   6.4 6.6  ALBUMIN 4.2 4.2 3.1* 3.8 4.1    Assessment and Plan: 87 year old female with past medical history significant for angiodysplasia of stomach and duodenum, AVM, remote left breast cancer, chronic kidney disease, Crest syndrome, GERD, interstitial lung disease, hypertension , moderate aortic stenosis, vitamin B12 deficiency ,prior IVC filter  placement, pericardial effusion, PE 2003, pulmonary hypertension, renal cell carcinoma, scleroderma, CHF, and chronic macrocytic anemia of uncertain etiology.  She is scheduled today for CT-guided bone marrow biopsy for further evaluation of her persistent anemia.Risks and benefits of procedure was discussed with the patient  including, but not limited to bleeding, infection, damage to adjacent structures or low yield requiring additional tests.  All of the questions were answered and there is agreement to proceed.  Consent signed and in chart.    Electronically Signed: D. Jeananne Rama, PA-C 08/13/2023, 12:08 PM   I spent a total of 20 minutes at the the patient's bedside AND on the patient's hospital floor or unit, greater than 50% of which was counseling/coordinating care for CT-guided bone marrow biopsy

## 2023-08-13 NOTE — Assessment & Plan Note (Signed)
Chronic stable continue on 3 L of O2

## 2023-08-13 NOTE — ED Triage Notes (Signed)
Patient arrives in wheelchair by POV with family c/o right leg pain and swelling. Patient states she fell and broke her left leg then had to go to rehab in December of last year. Patient states since then she has had issues with pain in her right leg. Daughter states patient was admitted back in August for CHF and has had problems with leg swelling since then. Daughter states patient has Doukas spots of her leg and went to dermatologist that was unsure of what the spots were however pt and daughter googled and are concerned patient has a blood clot.

## 2023-08-13 NOTE — H&P (Addendum)
Paula Ward ZOX:[id number] DOB: [birth year] DOA: 08/13/2023     PCP: [name], MD   Outpatient Specialists:   CARDS: Dr. Olga Millers, MD  Oncology  Dr.Dorsey Pulmonology Dr. Sherene Sires Patient arrived to ER on 08/13/23 at 1645 Referred by Attending Glyn Ade, MD   Patient coming from:    home Lives alone,      Chief Complaint:   Chief Complaint  Patient presents with   Leg Pain    HPI: Paula Ward is a 87 y.o. female with medical history significant of diastolic CHF, pulmonary HTN, chronic resp failure on 2 L , ILD,    significant for AVM, anemia , CKD and breast cancer, CREST   Presented with   right leg swelling and pain 3 weeks of worsening right leg pain and swelling subjective fever no chills Has hx of wounds followed by podiatry in the past, has not been able to tolerate weight baring on this leg since she had a fracture 1 year Had a hx of DVT no on AC due to gi bleed Sp IV filter    Supposed to have bone marrow biopsy done tomorrow to figure out the cause of chronic anemia Has been more short of breath  Her right leg has been itching and hurting more She has been a bit moe SOB no CP  Denies significant ETOH intake   Does not smoke   Lab Results  Component Value Date   SARSCOV2NAA NEGATIVE 07/16/2022   SARSCOV2NAA NEGATIVE 07/09/2022   SARSCOV2NAA NEGATIVE 04/12/2021   SARSCOV2NAA NEGATIVE 03/28/2021       Regarding pertinent Chronic problems:     Lipid Panel     Component Value Date/Time   CHOL 158 06/24/2018 1004   TRIG 112.0 06/24/2018 1004   HDL 57.50 06/24/2018 1004   CHOLHDL 3 06/24/2018 1004   VLDL 22.4 06/24/2018 1004   LDLCALC 78 06/24/2018 1004       chronic CHF diastolic/systolic/ combined - last echo  Recent Results (from the past 81191 hour(s))  ECHOCARDIOGRAM COMPLETE   Collection Time: 06/05/23 11:31 AM  Result Value   Weight 1,710.77   Height 63   BP 129/64   S' Lateral 2.00   AR max vel 1.07    AV Area VTI 0.95   AV Mean grad 37.0   AV Peak grad 58.7   Ao pk vel 3.83   Area-P 1/2 4.17   AV Area mean vel 0.94   P 1/2 time 71.5   Est EF 70 - 75%   Narrative      ECHOCARDIOGRAM REPORT       1. Left ventricular ejection fraction, by estimation, is 70 to 75%. The left ventricle has hyperdynamic function. The left ventricle has no regional wall motion abnormalities. There is moderate concentric left ventricular hypertrophy. Indeterminate  diastolic filling due to E-A fusion.  2. Right ventricular systolic function is mildly reduced. The right ventricular size is normal.  3. Left atrial size was severely dilated.  4. Large pericardial effusion surrounds heart, mainly posteriorly and lateral to LV. Naxunak dimension is 5.6 cm posteriorly. It has increased in size from echo done in June 2024. Does not meet criteria for tamponade by echo criteria. Clinical  correlation indicated .Marland Kitchen Large pericardial effusion.  5. MV annulus is severely calcified. MV is thickened with restricted motion. Difficult to see well Mean gradient through the MV is 8 mm Hg. MVA (Pt1/2 ) 3 cm2. Compared to echo report  Cr   Up from baseline see below Lab Results  Component Value Date   CREATININE 1.51 (H) 08/13/2023   CREATININE 1.29 (H) 08/03/2023   CREATININE 1.32 (H) 06/30/2023    Recent Labs  Lab 08/13/23 1823  AST 24  ALT 13  ALKPHOS 69  BILITOT 0.6  PROT 6.6  ALBUMIN 3.9   Lab Results  Component Value Date   CALCIUM 8.5 (L) 08/13/2023   PHOS 4.0 06/06/2023     Plt: Lab Results  Component Value Date   PLT 170 08/13/2023       Recent Labs  Lab 08/13/23 1823  WBC 5.6  NEUTROABS 4.3  HGB 9.4*  HCT 30.1*  MCV 113.6*  PLT 170    HG/HCT  stable,      Component Value Date/Time   HGB 9.4 (L) 08/13/2023 1823   HGB 9.5 (L) 07/22/2023 1214   HGB 11.2 04/21/2022 0940   HCT 30.1 (L) 08/13/2023 1823   HCT 32.7 (L) 04/21/2022 0940   MCV 113.6 (H) 08/13/2023 1823   MCV 104 (H) 04/21/2022 0940   ________________________________________ Hospitalist was called for admission for cellulitis of the right leg    The following Work up has been ordered so far:  Orders Placed This Encounter  Procedures   Blood culture (routine x  2)   DG Tibia/Fibula Right   CBC with Differential   Basic metabolic panel   Sedimentation rate   C-reactive protein   Consult to hospitalist   ED EKG   EKG 12-Lead   VAS [place] LOWER EXTREMITY VENOUS (DVT) (ONLY [facility])     OTHER Significant initial  Findings:  labs showing:     Cultures:    Component Value Date/Time   SDES URINE, CLEAN CATCH 04/03/2023 1603   SPECREQUEST NONE 04/03/2023 1603   CULT (A) 04/03/2023 1603    <10,000 COLONIES/mL INSIGNIFICANT GROWTH Performed at [facility], [address], [place], [place] [address]    REPTSTATUS 04/05/2023 FINAL 04/03/2023 1603     Radiological Exams on Admission: VAS [place] LOWER EXTREMITY VENOUS (DVT) (ONLY [facility])  Result Date: 08/13/2023  Lower Venous DVT Study Patient Name:  Paula Ward  Date of Exam:   08/13/2023 Medical Rec #: [id number]        Accession #:    [id number] Date of Birth: 1936/01/09        Patient Gender: F Patient Age:   48 years Exam Location:  [facility] Procedure:      VAS [place] LOWER EXTREMITY VENOUS (DVT) Referring Phys: Almeta Monas COUNTRYMAN --------------------------------------------------------------------------------  Indications: Swelling.  Risk Factors: Immobility past pregnancy. Comparison Study: No significant changes seen since previous exam 06/25/22 Performing Technologist: Shona Simpson  Examination Guidelines: A complete evaluation includes B-mode imaging, spectral Doppler, color Doppler, and power Doppler as needed of all accessible portions of each vessel. Bilateral testing is considered an integral part of a complete examination. Limited examinations for reoccurring indications may be performed as noted. The reflux portion of the exam is performed with the patient in reverse Trendelenburg.  +---------+---------------+---------+-----------+----------+--------------+ RIGHT    CompressibilityPhasicitySpontaneityPropertiesThrombus Aging  +---------+---------------+---------+-----------+----------+--------------+ CFV      Full           Yes      Yes                                 +---------+---------------+---------+-----------+----------+--------------+ SFJ  Cr   Up from baseline see below Lab Results  Component Value Date   CREATININE 1.51 (H) 08/13/2023   CREATININE 1.29 (H) 08/03/2023   CREATININE 1.32 (H) 06/30/2023    Recent Labs  Lab 08/13/23 1823  AST 24  ALT 13  ALKPHOS 69  BILITOT 0.6  PROT 6.6  ALBUMIN 3.9   Lab Results  Component Value Date   CALCIUM 8.5 (L) 08/13/2023   PHOS 4.0 06/06/2023     Plt: Lab Results  Component Value Date   PLT 170 08/13/2023       Recent Labs  Lab 08/13/23 1823  WBC 5.6  NEUTROABS 4.3  HGB 9.4*  HCT 30.1*  MCV 113.6*  PLT 170    HG/HCT  stable,      Component Value Date/Time   HGB 9.4 (L) 08/13/2023 1823   HGB 9.5 (L) 07/22/2023 1214   HGB 11.2 04/21/2022 0940   HCT 30.1 (L) 08/13/2023 1823   HCT 32.7 (L) 04/21/2022 0940   MCV 113.6 (H) 08/13/2023 1823   MCV 104 (H) 04/21/2022 0940   ________________________________________ Hospitalist was called for admission for cellulitis of the right leg    The following Work up has been ordered so far:  Orders Placed This Encounter  Procedures   Blood culture (routine x  2)   DG Tibia/Fibula Right   CBC with Differential   Basic metabolic panel   Sedimentation rate   C-reactive protein   Consult to hospitalist   ED EKG   EKG 12-Lead   VAS [place] LOWER EXTREMITY VENOUS (DVT) (ONLY [facility])     OTHER Significant initial  Findings:  labs showing:     Cultures:    Component Value Date/Time   SDES URINE, CLEAN CATCH 04/03/2023 1603   SPECREQUEST NONE 04/03/2023 1603   CULT (A) 04/03/2023 1603    <10,000 COLONIES/mL INSIGNIFICANT GROWTH Performed at [facility], [address], [place], [place] [address]    REPTSTATUS 04/05/2023 FINAL 04/03/2023 1603     Radiological Exams on Admission: VAS [place] LOWER EXTREMITY VENOUS (DVT) (ONLY [facility])  Result Date: 08/13/2023  Lower Venous DVT Study Patient Name:  Paula Ward  Date of Exam:   08/13/2023 Medical Rec #: [id number]        Accession #:    [id number] Date of Birth: 1936/01/09        Patient Gender: F Patient Age:   48 years Exam Location:  [facility] Procedure:      VAS [place] LOWER EXTREMITY VENOUS (DVT) Referring Phys: Almeta Monas COUNTRYMAN --------------------------------------------------------------------------------  Indications: Swelling.  Risk Factors: Immobility past pregnancy. Comparison Study: No significant changes seen since previous exam 06/25/22 Performing Technologist: Shona Simpson  Examination Guidelines: A complete evaluation includes B-mode imaging, spectral Doppler, color Doppler, and power Doppler as needed of all accessible portions of each vessel. Bilateral testing is considered an integral part of a complete examination. Limited examinations for reoccurring indications may be performed as noted. The reflux portion of the exam is performed with the patient in reverse Trendelenburg.  +---------+---------------+---------+-----------+----------+--------------+ RIGHT    CompressibilityPhasicitySpontaneityPropertiesThrombus Aging  +---------+---------------+---------+-----------+----------+--------------+ CFV      Full           Yes      Yes                                 +---------+---------------+---------+-----------+----------+--------------+ SFJ  Cr   Up from baseline see below Lab Results  Component Value Date   CREATININE 1.51 (H) 08/13/2023   CREATININE 1.29 (H) 08/03/2023   CREATININE 1.32 (H) 06/30/2023    Recent Labs  Lab 08/13/23 1823  AST 24  ALT 13  ALKPHOS 69  BILITOT 0.6  PROT 6.6  ALBUMIN 3.9   Lab Results  Component Value Date   CALCIUM 8.5 (L) 08/13/2023   PHOS 4.0 06/06/2023     Plt: Lab Results  Component Value Date   PLT 170 08/13/2023       Recent Labs  Lab 08/13/23 1823  WBC 5.6  NEUTROABS 4.3  HGB 9.4*  HCT 30.1*  MCV 113.6*  PLT 170    HG/HCT  stable,      Component Value Date/Time   HGB 9.4 (L) 08/13/2023 1823   HGB 9.5 (L) 07/22/2023 1214   HGB 11.2 04/21/2022 0940   HCT 30.1 (L) 08/13/2023 1823   HCT 32.7 (L) 04/21/2022 0940   MCV 113.6 (H) 08/13/2023 1823   MCV 104 (H) 04/21/2022 0940   ________________________________________ Hospitalist was called for admission for cellulitis of the right leg    The following Work up has been ordered so far:  Orders Placed This Encounter  Procedures   Blood culture (routine x  2)   DG Tibia/Fibula Right   CBC with Differential   Basic metabolic panel   Sedimentation rate   C-reactive protein   Consult to hospitalist   ED EKG   EKG 12-Lead   VAS [place] LOWER EXTREMITY VENOUS (DVT) (ONLY [facility])     OTHER Significant initial  Findings:  labs showing:     Cultures:    Component Value Date/Time   SDES URINE, CLEAN CATCH 04/03/2023 1603   SPECREQUEST NONE 04/03/2023 1603   CULT (A) 04/03/2023 1603    <10,000 COLONIES/mL INSIGNIFICANT GROWTH Performed at [facility], [address], [place], [place] [address]    REPTSTATUS 04/05/2023 FINAL 04/03/2023 1603     Radiological Exams on Admission: VAS [place] LOWER EXTREMITY VENOUS (DVT) (ONLY [facility])  Result Date: 08/13/2023  Lower Venous DVT Study Patient Name:  Paula Ward  Date of Exam:   08/13/2023 Medical Rec #: [id number]        Accession #:    [id number] Date of Birth: 1936/01/09        Patient Gender: F Patient Age:   48 years Exam Location:  [facility] Procedure:      VAS [place] LOWER EXTREMITY VENOUS (DVT) Referring Phys: Almeta Monas COUNTRYMAN --------------------------------------------------------------------------------  Indications: Swelling.  Risk Factors: Immobility past pregnancy. Comparison Study: No significant changes seen since previous exam 06/25/22 Performing Technologist: Shona Simpson  Examination Guidelines: A complete evaluation includes B-mode imaging, spectral Doppler, color Doppler, and power Doppler as needed of all accessible portions of each vessel. Bilateral testing is considered an integral part of a complete examination. Limited examinations for reoccurring indications may be performed as noted. The reflux portion of the exam is performed with the patient in reverse Trendelenburg.  +---------+---------------+---------+-----------+----------+--------------+ RIGHT    CompressibilityPhasicitySpontaneityPropertiesThrombus Aging  +---------+---------------+---------+-----------+----------+--------------+ CFV      Full           Yes      Yes                                 +---------+---------------+---------+-----------+----------+--------------+ SFJ  Paula Ward ZOX:[id number] DOB: [birth year] DOA: 08/13/2023     PCP: [name], MD   Outpatient Specialists:   CARDS: Dr. Olga Millers, MD  Oncology  Dr.Dorsey Pulmonology Dr. Sherene Sires Patient arrived to ER on 08/13/23 at 1645 Referred by Attending Glyn Ade, MD   Patient coming from:    home Lives alone,      Chief Complaint:   Chief Complaint  Patient presents with   Leg Pain    HPI: Paula Ward is a 87 y.o. female with medical history significant of diastolic CHF, pulmonary HTN, chronic resp failure on 2 L , ILD,    significant for AVM, anemia , CKD and breast cancer, CREST   Presented with   right leg swelling and pain 3 weeks of worsening right leg pain and swelling subjective fever no chills Has hx of wounds followed by podiatry in the past, has not been able to tolerate weight baring on this leg since she had a fracture 1 year Had a hx of DVT no on AC due to gi bleed Sp IV filter    Supposed to have bone marrow biopsy done tomorrow to figure out the cause of chronic anemia Has been more short of breath  Her right leg has been itching and hurting more She has been a bit moe SOB no CP  Denies significant ETOH intake   Does not smoke   Lab Results  Component Value Date   SARSCOV2NAA NEGATIVE 07/16/2022   SARSCOV2NAA NEGATIVE 07/09/2022   SARSCOV2NAA NEGATIVE 04/12/2021   SARSCOV2NAA NEGATIVE 03/28/2021       Regarding pertinent Chronic problems:     Lipid Panel     Component Value Date/Time   CHOL 158 06/24/2018 1004   TRIG 112.0 06/24/2018 1004   HDL 57.50 06/24/2018 1004   CHOLHDL 3 06/24/2018 1004   VLDL 22.4 06/24/2018 1004   LDLCALC 78 06/24/2018 1004       chronic CHF diastolic/systolic/ combined - last echo  Recent Results (from the past 81191 hour(s))  ECHOCARDIOGRAM COMPLETE   Collection Time: 06/05/23 11:31 AM  Result Value   Weight 1,710.77   Height 63   BP 129/64   S' Lateral 2.00   AR max vel 1.07    AV Area VTI 0.95   AV Mean grad 37.0   AV Peak grad 58.7   Ao pk vel 3.83   Area-P 1/2 4.17   AV Area mean vel 0.94   P 1/2 time 71.5   Est EF 70 - 75%   Narrative      ECHOCARDIOGRAM REPORT       1. Left ventricular ejection fraction, by estimation, is 70 to 75%. The left ventricle has hyperdynamic function. The left ventricle has no regional wall motion abnormalities. There is moderate concentric left ventricular hypertrophy. Indeterminate  diastolic filling due to E-A fusion.  2. Right ventricular systolic function is mildly reduced. The right ventricular size is normal.  3. Left atrial size was severely dilated.  4. Large pericardial effusion surrounds heart, mainly posteriorly and lateral to LV. Naxunak dimension is 5.6 cm posteriorly. It has increased in size from echo done in June 2024. Does not meet criteria for tamponade by echo criteria. Clinical  correlation indicated .Marland Kitchen Large pericardial effusion.  5. MV annulus is severely calcified. MV is thickened with restricted motion. Difficult to see well Mean gradient through the MV is 8 mm Hg. MVA (Pt1/2 ) 3 cm2. Compared to echo report  Cr   Up from baseline see below Lab Results  Component Value Date   CREATININE 1.51 (H) 08/13/2023   CREATININE 1.29 (H) 08/03/2023   CREATININE 1.32 (H) 06/30/2023    Recent Labs  Lab 08/13/23 1823  AST 24  ALT 13  ALKPHOS 69  BILITOT 0.6  PROT 6.6  ALBUMIN 3.9   Lab Results  Component Value Date   CALCIUM 8.5 (L) 08/13/2023   PHOS 4.0 06/06/2023     Plt: Lab Results  Component Value Date   PLT 170 08/13/2023       Recent Labs  Lab 08/13/23 1823  WBC 5.6  NEUTROABS 4.3  HGB 9.4*  HCT 30.1*  MCV 113.6*  PLT 170    HG/HCT  stable,      Component Value Date/Time   HGB 9.4 (L) 08/13/2023 1823   HGB 9.5 (L) 07/22/2023 1214   HGB 11.2 04/21/2022 0940   HCT 30.1 (L) 08/13/2023 1823   HCT 32.7 (L) 04/21/2022 0940   MCV 113.6 (H) 08/13/2023 1823   MCV 104 (H) 04/21/2022 0940   ________________________________________ Hospitalist was called for admission for cellulitis of the right leg    The following Work up has been ordered so far:  Orders Placed This Encounter  Procedures   Blood culture (routine x  2)   DG Tibia/Fibula Right   CBC with Differential   Basic metabolic panel   Sedimentation rate   C-reactive protein   Consult to hospitalist   ED EKG   EKG 12-Lead   VAS [place] LOWER EXTREMITY VENOUS (DVT) (ONLY [facility])     OTHER Significant initial  Findings:  labs showing:     Cultures:    Component Value Date/Time   SDES URINE, CLEAN CATCH 04/03/2023 1603   SPECREQUEST NONE 04/03/2023 1603   CULT (A) 04/03/2023 1603    <10,000 COLONIES/mL INSIGNIFICANT GROWTH Performed at [facility], [address], [place], [place] [address]    REPTSTATUS 04/05/2023 FINAL 04/03/2023 1603     Radiological Exams on Admission: VAS [place] LOWER EXTREMITY VENOUS (DVT) (ONLY [facility])  Result Date: 08/13/2023  Lower Venous DVT Study Patient Name:  Paula Ward  Date of Exam:   08/13/2023 Medical Rec #: [id number]        Accession #:    [id number] Date of Birth: 1936/01/09        Patient Gender: F Patient Age:   48 years Exam Location:  [facility] Procedure:      VAS [place] LOWER EXTREMITY VENOUS (DVT) Referring Phys: Almeta Monas COUNTRYMAN --------------------------------------------------------------------------------  Indications: Swelling.  Risk Factors: Immobility past pregnancy. Comparison Study: No significant changes seen since previous exam 06/25/22 Performing Technologist: Shona Simpson  Examination Guidelines: A complete evaluation includes B-mode imaging, spectral Doppler, color Doppler, and power Doppler as needed of all accessible portions of each vessel. Bilateral testing is considered an integral part of a complete examination. Limited examinations for reoccurring indications may be performed as noted. The reflux portion of the exam is performed with the patient in reverse Trendelenburg.  +---------+---------------+---------+-----------+----------+--------------+ RIGHT    CompressibilityPhasicitySpontaneityPropertiesThrombus Aging  +---------+---------------+---------+-----------+----------+--------------+ CFV      Full           Yes      Yes                                 +---------+---------------+---------+-----------+----------+--------------+ SFJ  Cr   Up from baseline see below Lab Results  Component Value Date   CREATININE 1.51 (H) 08/13/2023   CREATININE 1.29 (H) 08/03/2023   CREATININE 1.32 (H) 06/30/2023    Recent Labs  Lab 08/13/23 1823  AST 24  ALT 13  ALKPHOS 69  BILITOT 0.6  PROT 6.6  ALBUMIN 3.9   Lab Results  Component Value Date   CALCIUM 8.5 (L) 08/13/2023   PHOS 4.0 06/06/2023     Plt: Lab Results  Component Value Date   PLT 170 08/13/2023       Recent Labs  Lab 08/13/23 1823  WBC 5.6  NEUTROABS 4.3  HGB 9.4*  HCT 30.1*  MCV 113.6*  PLT 170    HG/HCT  stable,      Component Value Date/Time   HGB 9.4 (L) 08/13/2023 1823   HGB 9.5 (L) 07/22/2023 1214   HGB 11.2 04/21/2022 0940   HCT 30.1 (L) 08/13/2023 1823   HCT 32.7 (L) 04/21/2022 0940   MCV 113.6 (H) 08/13/2023 1823   MCV 104 (H) 04/21/2022 0940   ________________________________________ Hospitalist was called for admission for cellulitis of the right leg    The following Work up has been ordered so far:  Orders Placed This Encounter  Procedures   Blood culture (routine x  2)   DG Tibia/Fibula Right   CBC with Differential   Basic metabolic panel   Sedimentation rate   C-reactive protein   Consult to hospitalist   ED EKG   EKG 12-Lead   VAS [place] LOWER EXTREMITY VENOUS (DVT) (ONLY [facility])     OTHER Significant initial  Findings:  labs showing:     Cultures:    Component Value Date/Time   SDES URINE, CLEAN CATCH 04/03/2023 1603   SPECREQUEST NONE 04/03/2023 1603   CULT (A) 04/03/2023 1603    <10,000 COLONIES/mL INSIGNIFICANT GROWTH Performed at [facility], [address], [place], [place] [address]    REPTSTATUS 04/05/2023 FINAL 04/03/2023 1603     Radiological Exams on Admission: VAS [place] LOWER EXTREMITY VENOUS (DVT) (ONLY [facility])  Result Date: 08/13/2023  Lower Venous DVT Study Patient Name:  Paula Ward  Date of Exam:   08/13/2023 Medical Rec #: [id number]        Accession #:    [id number] Date of Birth: 1936/01/09        Patient Gender: F Patient Age:   48 years Exam Location:  [facility] Procedure:      VAS [place] LOWER EXTREMITY VENOUS (DVT) Referring Phys: Almeta Monas COUNTRYMAN --------------------------------------------------------------------------------  Indications: Swelling.  Risk Factors: Immobility past pregnancy. Comparison Study: No significant changes seen since previous exam 06/25/22 Performing Technologist: Shona Simpson  Examination Guidelines: A complete evaluation includes B-mode imaging, spectral Doppler, color Doppler, and power Doppler as needed of all accessible portions of each vessel. Bilateral testing is considered an integral part of a complete examination. Limited examinations for reoccurring indications may be performed as noted. The reflux portion of the exam is performed with the patient in reverse Trendelenburg.  +---------+---------------+---------+-----------+----------+--------------+ RIGHT    CompressibilityPhasicitySpontaneityPropertiesThrombus Aging  +---------+---------------+---------+-----------+----------+--------------+ CFV      Full           Yes      Yes                                 +---------+---------------+---------+-----------+----------+--------------+ SFJ  Paula Ward ZOX:[id number] DOB: [birth year] DOA: 08/13/2023     PCP: [name], MD   Outpatient Specialists:   CARDS: Dr. Olga Millers, MD  Oncology  Dr.Dorsey Pulmonology Dr. Sherene Sires Patient arrived to ER on 08/13/23 at 1645 Referred by Attending Glyn Ade, MD   Patient coming from:    home Lives alone,      Chief Complaint:   Chief Complaint  Patient presents with   Leg Pain    HPI: Paula Ward is a 87 y.o. female with medical history significant of diastolic CHF, pulmonary HTN, chronic resp failure on 2 L , ILD,    significant for AVM, anemia , CKD and breast cancer, CREST   Presented with   right leg swelling and pain 3 weeks of worsening right leg pain and swelling subjective fever no chills Has hx of wounds followed by podiatry in the past, has not been able to tolerate weight baring on this leg since she had a fracture 1 year Had a hx of DVT no on AC due to gi bleed Sp IV filter    Supposed to have bone marrow biopsy done tomorrow to figure out the cause of chronic anemia Has been more short of breath  Her right leg has been itching and hurting more She has been a bit moe SOB no CP  Denies significant ETOH intake   Does not smoke   Lab Results  Component Value Date   SARSCOV2NAA NEGATIVE 07/16/2022   SARSCOV2NAA NEGATIVE 07/09/2022   SARSCOV2NAA NEGATIVE 04/12/2021   SARSCOV2NAA NEGATIVE 03/28/2021       Regarding pertinent Chronic problems:     Lipid Panel     Component Value Date/Time   CHOL 158 06/24/2018 1004   TRIG 112.0 06/24/2018 1004   HDL 57.50 06/24/2018 1004   CHOLHDL 3 06/24/2018 1004   VLDL 22.4 06/24/2018 1004   LDLCALC 78 06/24/2018 1004       chronic CHF diastolic/systolic/ combined - last echo  Recent Results (from the past 81191 hour(s))  ECHOCARDIOGRAM COMPLETE   Collection Time: 06/05/23 11:31 AM  Result Value   Weight 1,710.77   Height 63   BP 129/64   S' Lateral 2.00   AR max vel 1.07    AV Area VTI 0.95   AV Mean grad 37.0   AV Peak grad 58.7   Ao pk vel 3.83   Area-P 1/2 4.17   AV Area mean vel 0.94   P 1/2 time 71.5   Est EF 70 - 75%   Narrative      ECHOCARDIOGRAM REPORT       1. Left ventricular ejection fraction, by estimation, is 70 to 75%. The left ventricle has hyperdynamic function. The left ventricle has no regional wall motion abnormalities. There is moderate concentric left ventricular hypertrophy. Indeterminate  diastolic filling due to E-A fusion.  2. Right ventricular systolic function is mildly reduced. The right ventricular size is normal.  3. Left atrial size was severely dilated.  4. Large pericardial effusion surrounds heart, mainly posteriorly and lateral to LV. [place] dimension is 5.6 cm posteriorly. It has increased in size from echo done in June 2024. Does not meet criteria for tamponade by echo criteria. Clinical  correlation indicated .Marland Kitchen Large pericardial effusion.  5. MV annulus is severely calcified. MV is thickened with restricted motion. Difficult to see well Mean gradient through the MV is 8 mm Hg. MVA (Pt1/2 ) 3 cm2. Compared to echo report  Cr   Up from baseline see below Lab Results  Component Value Date   CREATININE 1.51 (H) 08/13/2023   CREATININE 1.29 (H) 08/03/2023   CREATININE 1.32 (H) 06/30/2023    Recent Labs  Lab 08/13/23 1823  AST 24  ALT 13  ALKPHOS 69  BILITOT 0.6  PROT 6.6  ALBUMIN 3.9   Lab Results  Component Value Date   CALCIUM 8.5 (L) 08/13/2023   PHOS 4.0 06/06/2023     Plt: Lab Results  Component Value Date   PLT 170 08/13/2023       Recent Labs  Lab 08/13/23 1823  WBC 5.6  NEUTROABS 4.3  HGB 9.4*  HCT 30.1*  MCV 113.6*  PLT 170    HG/HCT  stable,      Component Value Date/Time   HGB 9.4 (L) 08/13/2023 1823   HGB 9.5 (L) 07/22/2023 1214   HGB 11.2 04/21/2022 0940   HCT 30.1 (L) 08/13/2023 1823   HCT 32.7 (L) 04/21/2022 0940   MCV 113.6 (H) 08/13/2023 1823   MCV 104 (H) 04/21/2022 0940   ________________________________________ Hospitalist was called for admission for cellulitis of the right leg    The following Work up has been ordered so far:  Orders Placed This Encounter  Procedures   Blood culture (routine x  2)   DG Tibia/Fibula Right   CBC with Differential   Basic metabolic panel   Sedimentation rate   C-reactive protein   Consult to hospitalist   ED EKG   EKG 12-Lead   VAS [place] LOWER EXTREMITY VENOUS (DVT) (ONLY [facility])     OTHER Significant initial  Findings:  labs showing:     Cultures:    Component Value Date/Time   SDES URINE, CLEAN CATCH 04/03/2023 1603   SPECREQUEST NONE 04/03/2023 1603   CULT (A) 04/03/2023 1603    <10,000 COLONIES/mL INSIGNIFICANT GROWTH Performed at [facility], [address], [place], [place] [address]    REPTSTATUS 04/05/2023 FINAL 04/03/2023 1603     Radiological Exams on Admission: VAS [place] LOWER EXTREMITY VENOUS (DVT) (ONLY [facility])  Result Date: 08/13/2023  Lower Venous DVT Study Patient Name:  Paula Ward  Date of Exam:   08/13/2023 Medical Rec #: [id number]        Accession #:    [id number] Date of Birth: 1936/01/09        Patient Gender: F Patient Age:   48 years Exam Location:  [facility] Procedure:      VAS [place] LOWER EXTREMITY VENOUS (DVT) Referring Phys: Almeta Monas COUNTRYMAN --------------------------------------------------------------------------------  Indications: Swelling.  Risk Factors: Immobility past pregnancy. Comparison Study: No significant changes seen since previous exam 06/25/22 Performing Technologist: Shona Simpson  Examination Guidelines: A complete evaluation includes B-mode imaging, spectral Doppler, color Doppler, and power Doppler as needed of all accessible portions of each vessel. Bilateral testing is considered an integral part of a complete examination. Limited examinations for reoccurring indications may be performed as noted. The reflux portion of the exam is performed with the patient in reverse Trendelenburg.  +---------+---------------+---------+-----------+----------+--------------+ RIGHT    CompressibilityPhasicitySpontaneityPropertiesThrombus Aging  +---------+---------------+---------+-----------+----------+--------------+ CFV      Full           Yes      Yes                                 +---------+---------------+---------+-----------+----------+--------------+ SFJ  Paula Ward ZOX:[id number] DOB: [birth year] DOA: 08/13/2023     PCP: [name], MD   Outpatient Specialists:   CARDS: Dr. Olga Millers, MD  Oncology  Dr.Dorsey Pulmonology Dr. Sherene Sires Patient arrived to ER on 08/13/23 at 1645 Referred by Attending Glyn Ade, MD   Patient coming from:    home Lives alone,      Chief Complaint:   Chief Complaint  Patient presents with   Leg Pain    HPI: Paula Ward is a 87 y.o. female with medical history significant of diastolic CHF, pulmonary HTN, chronic resp failure on 2 L , ILD,    significant for AVM, anemia , CKD and breast cancer, CREST   Presented with   right leg swelling and pain 3 weeks of worsening right leg pain and swelling subjective fever no chills Has hx of wounds followed by podiatry in the past, has not been able to tolerate weight baring on this leg since she had a fracture 1 year Had a hx of DVT no on AC due to gi bleed Sp IV filter    Supposed to have bone marrow biopsy done tomorrow to figure out the cause of chronic anemia Has been more short of breath  Her right leg has been itching and hurting more She has been a bit moe SOB no CP  Denies significant ETOH intake   Does not smoke   Lab Results  Component Value Date   SARSCOV2NAA NEGATIVE 07/16/2022   SARSCOV2NAA NEGATIVE 07/09/2022   SARSCOV2NAA NEGATIVE 04/12/2021   SARSCOV2NAA NEGATIVE 03/28/2021       Regarding pertinent Chronic problems:     Lipid Panel     Component Value Date/Time   CHOL 158 06/24/2018 1004   TRIG 112.0 06/24/2018 1004   HDL 57.50 06/24/2018 1004   CHOLHDL 3 06/24/2018 1004   VLDL 22.4 06/24/2018 1004   LDLCALC 78 06/24/2018 1004       chronic CHF diastolic/systolic/ combined - last echo  Recent Results (from the past 81191 hour(s))  ECHOCARDIOGRAM COMPLETE   Collection Time: 06/05/23 11:31 AM  Result Value   Weight 1,710.77   Height 63   BP 129/64   S' Lateral 2.00   AR max vel 1.07    AV Area VTI 0.95   AV Mean grad 37.0   AV Peak grad 58.7   Ao pk vel 3.83   Area-P 1/2 4.17   AV Area mean vel 0.94   P 1/2 time 71.5   Est EF 70 - 75%   Narrative      ECHOCARDIOGRAM REPORT       1. Left ventricular ejection fraction, by estimation, is 70 to 75%. The left ventricle has hyperdynamic function. The left ventricle has no regional wall motion abnormalities. There is moderate concentric left ventricular hypertrophy. Indeterminate  diastolic filling due to E-A fusion.  2. Right ventricular systolic function is mildly reduced. The right ventricular size is normal.  3. Left atrial size was severely dilated.  4. Large pericardial effusion surrounds heart, mainly posteriorly and lateral to LV. [place] dimension is 5.6 cm posteriorly. It has increased in size from echo done in June 2024. Does not meet criteria for tamponade by echo criteria. Clinical  correlation indicated .Marland Kitchen Large pericardial effusion.  5. MV annulus is severely calcified. MV is thickened with restricted motion. Difficult to see well Mean gradient through the MV is 8 mm Hg. MVA (Pt1/2 ) 3 cm2. Compared to echo report  Cr   Up from baseline see below Lab Results  Component Value Date   CREATININE 1.51 (H) 08/13/2023   CREATININE 1.29 (H) 08/03/2023   CREATININE 1.32 (H) 06/30/2023    Recent Labs  Lab 08/13/23 1823  AST 24  ALT 13  ALKPHOS 69  BILITOT 0.6  PROT 6.6  ALBUMIN 3.9   Lab Results  Component Value Date   CALCIUM 8.5 (L) 08/13/2023   PHOS 4.0 06/06/2023     Plt: Lab Results  Component Value Date   PLT 170 08/13/2023       Recent Labs  Lab 08/13/23 1823  WBC 5.6  NEUTROABS 4.3  HGB 9.4*  HCT 30.1*  MCV 113.6*  PLT 170    HG/HCT  stable,      Component Value Date/Time   HGB 9.4 (L) 08/13/2023 1823   HGB 9.5 (L) 07/22/2023 1214   HGB 11.2 04/21/2022 0940   HCT 30.1 (L) 08/13/2023 1823   HCT 32.7 (L) 04/21/2022 0940   MCV 113.6 (H) 08/13/2023 1823   MCV 104 (H) 04/21/2022 0940   ________________________________________ Hospitalist was called for admission for cellulitis of the right leg    The following Work up has been ordered so far:  Orders Placed This Encounter  Procedures   Blood culture (routine x  2)   DG Tibia/Fibula Right   CBC with Differential   Basic metabolic panel   Sedimentation rate   C-reactive protein   Consult to hospitalist   ED EKG   EKG 12-Lead   VAS [place] LOWER EXTREMITY VENOUS (DVT) (ONLY [facility])     OTHER Significant initial  Findings:  labs showing:     Cultures:    Component Value Date/Time   SDES URINE, CLEAN CATCH 04/03/2023 1603   SPECREQUEST NONE 04/03/2023 1603   CULT (A) 04/03/2023 1603    <10,000 COLONIES/mL INSIGNIFICANT GROWTH Performed at [facility], [address], [place], [place] [address]    REPTSTATUS 04/05/2023 FINAL 04/03/2023 1603     Radiological Exams on Admission: VAS [place] LOWER EXTREMITY VENOUS (DVT) (ONLY [facility])  Result Date: 08/13/2023  Lower Venous DVT Study Patient Name:  Paula Ward  Date of Exam:   08/13/2023 Medical Rec #: [id number]        Accession #:    [id number] Date of Birth: 1936/01/09        Patient Gender: F Patient Age:   48 years Exam Location:  [facility] Procedure:      VAS [place] LOWER EXTREMITY VENOUS (DVT) Referring Phys: Almeta Monas COUNTRYMAN --------------------------------------------------------------------------------  Indications: Swelling.  Risk Factors: Immobility past pregnancy. Comparison Study: No significant changes seen since previous exam 06/25/22 Performing Technologist: Shona Simpson  Examination Guidelines: A complete evaluation includes B-mode imaging, spectral Doppler, color Doppler, and power Doppler as needed of all accessible portions of each vessel. Bilateral testing is considered an integral part of a complete examination. Limited examinations for reoccurring indications may be performed as noted. The reflux portion of the exam is performed with the patient in reverse Trendelenburg.  +---------+---------------+---------+-----------+----------+--------------+ RIGHT    CompressibilityPhasicitySpontaneityPropertiesThrombus Aging  +---------+---------------+---------+-----------+----------+--------------+ CFV      Full           Yes      Yes                                 +---------+---------------+---------+-----------+----------+--------------+ SFJ

## 2023-08-13 NOTE — Assessment & Plan Note (Signed)
Continue Aldactone °

## 2023-08-13 NOTE — Telephone Encounter (Signed)
Received fax from [employer] program that Rubye Oaks, NP is successfully enrolled into [employer] program. Placed In Tammy's mailbox for her own records if needed. The [employer] enrollment is not patient-specific so didn ot want to scan into patient's chart

## 2023-08-13 NOTE — Assessment & Plan Note (Signed)
-

## 2023-08-13 NOTE — ED Notes (Signed)
I took patient to the restroom.

## 2023-08-13 NOTE — Assessment & Plan Note (Signed)
Repeat echogram to see if there is any progression

## 2023-08-13 NOTE — Assessment & Plan Note (Addendum)
acute on chronic will order urine electrolytes   Fluid status is a bit difficult to determine

## 2023-08-14 ENCOUNTER — Inpatient Hospital Stay (HOSPITAL_COMMUNITY): Payer: Medicare PPO

## 2023-08-14 ENCOUNTER — Encounter (HOSPITAL_COMMUNITY): Payer: Self-pay

## 2023-08-14 ENCOUNTER — Ambulatory Visit (HOSPITAL_COMMUNITY): Payer: Medicare PPO

## 2023-08-14 ENCOUNTER — Encounter: Payer: Self-pay | Admitting: Physician Assistant

## 2023-08-14 ENCOUNTER — Telehealth: Payer: Self-pay

## 2023-08-14 ENCOUNTER — Ambulatory Visit (HOSPITAL_COMMUNITY)
Admission: RE | Admit: 2023-08-14 | Discharge: 2023-08-14 | Disposition: A | Discharge status: Home / Self Care | Payer: Medicare PPO | Source: Ambulatory Visit | Attending: Physician Assistant | Admitting: Physician Assistant

## 2023-08-14 DIAGNOSIS — J849 Interstitial pulmonary disease, unspecified: Secondary | ICD-10-CM | POA: Insufficient documentation

## 2023-08-14 DIAGNOSIS — M341 CR(E)ST syndrome: Secondary | ICD-10-CM | POA: Insufficient documentation

## 2023-08-14 DIAGNOSIS — Z86711 Personal history of pulmonary embolism: Secondary | ICD-10-CM | POA: Insufficient documentation

## 2023-08-14 DIAGNOSIS — J9612 Chronic respiratory failure with hypercapnia: Secondary | ICD-10-CM | POA: Diagnosis not present

## 2023-08-14 DIAGNOSIS — I350 Nonrheumatic aortic (valve) stenosis: Secondary | ICD-10-CM | POA: Insufficient documentation

## 2023-08-14 DIAGNOSIS — I509 Heart failure, unspecified: Secondary | ICD-10-CM | POA: Insufficient documentation

## 2023-08-14 DIAGNOSIS — D649 Anemia, unspecified: Secondary | ICD-10-CM | POA: Diagnosis not present

## 2023-08-14 DIAGNOSIS — I130 Hypertensive heart and chronic kidney disease with heart failure and stage 1 through stage 4 chronic kidney disease, or unspecified chronic kidney disease: Secondary | ICD-10-CM | POA: Diagnosis not present

## 2023-08-14 DIAGNOSIS — L03115 Cellulitis of right lower limb: Secondary | ICD-10-CM | POA: Diagnosis not present

## 2023-08-14 DIAGNOSIS — Z9049 Acquired absence of other specified parts of digestive tract: Secondary | ICD-10-CM | POA: Diagnosis not present

## 2023-08-14 DIAGNOSIS — I272 Pulmonary hypertension, unspecified: Secondary | ICD-10-CM | POA: Insufficient documentation

## 2023-08-14 DIAGNOSIS — K219 Gastro-esophageal reflux disease without esophagitis: Secondary | ICD-10-CM | POA: Diagnosis not present

## 2023-08-14 DIAGNOSIS — E538 Deficiency of other specified B group vitamins: Secondary | ICD-10-CM | POA: Insufficient documentation

## 2023-08-14 DIAGNOSIS — D539 Nutritional anemia, unspecified: Secondary | ICD-10-CM | POA: Diagnosis not present

## 2023-08-14 DIAGNOSIS — N189 Chronic kidney disease, unspecified: Secondary | ICD-10-CM | POA: Insufficient documentation

## 2023-08-14 DIAGNOSIS — I5033 Acute on chronic diastolic (congestive) heart failure: Secondary | ICD-10-CM | POA: Diagnosis not present

## 2023-08-14 DIAGNOSIS — Z85528 Personal history of other malignant neoplasm of kidney: Secondary | ICD-10-CM | POA: Insufficient documentation

## 2023-08-14 DIAGNOSIS — Z853 Personal history of malignant neoplasm of breast: Secondary | ICD-10-CM | POA: Insufficient documentation

## 2023-08-14 DIAGNOSIS — I3139 Other pericardial effusion (noninflammatory): Secondary | ICD-10-CM | POA: Diagnosis not present

## 2023-08-14 DIAGNOSIS — J9611 Chronic respiratory failure with hypoxia: Secondary | ICD-10-CM | POA: Diagnosis not present

## 2023-08-14 LAB — BASIC METABOLIC PANEL
Anion gap: 11 (ref 5–15)
BUN/Creatinine Ratio: 30 — ABNORMAL HIGH (ref 12–28)
BUN: 47 mg/dL — ABNORMAL HIGH (ref 8–27)
BUN: 48 mg/dL — ABNORMAL HIGH (ref 8–23)
CO2: 29 mmol/L (ref 20–29)
CO2: 32 mmol/L (ref 22–32)
Calcium: 8.9 mg/dL (ref 8.7–10.3)
Calcium: 8.8 mg/dL — ABNORMAL LOW (ref 8.9–10.3)
Chloride: 94 mmol/L — ABNORMAL LOW (ref 96–106)
Chloride: 94 mmol/L — ABNORMAL LOW (ref 98–111)
Creatinine, Ser: 1.59 mg/dL — ABNORMAL HIGH (ref 0.57–1.00)
Creatinine, Ser: 1.31 mg/dL — ABNORMAL HIGH (ref 0.44–1.00)
GFR, Estimated: 40 mL/min — ABNORMAL LOW (ref >60)
Glucose, Bld: 88 mg/dL (ref 70–99)
Glucose: 114 mg/dL — ABNORMAL HIGH (ref 70–99)
Potassium: 4.6 mmol/L (ref 3.5–5.2)
Potassium: 4.1 mmol/L (ref 3.5–5.1)
Sodium: 138 mmol/L (ref 134–144)
Sodium: 137 mmol/L (ref 135–145)
eGFR: 32 mL/min/{1.73_m2} — ABNORMAL LOW (ref >59)

## 2023-08-14 LAB — CBC
HCT: 30 % — ABNORMAL LOW (ref 36.0–46.0)
Hemoglobin: 9.6 g/dL — ABNORMAL LOW (ref 12.0–15.0)
MCH: 35.2 pg — ABNORMAL HIGH (ref 26.0–34.0)
MCHC: 32 g/dL (ref 30.0–36.0)
MCV: 109.9 fL — ABNORMAL HIGH (ref 80.0–100.0)
Platelets: 178 10*3/uL (ref 150–400)
RBC: 2.73 MIL/uL — ABNORMAL LOW (ref 3.87–5.11)
RDW: 13 % (ref 11.5–15.5)
WBC: 6.1 10*3/uL (ref 4.0–10.5)
nRBC: 0 % (ref 0.0–0.2)

## 2023-08-14 LAB — COMPREHENSIVE METABOLIC PANEL
ALT: 12 U/L (ref 0–44)
AST: 21 U/L (ref 15–41)
Albumin: 3.4 g/dL — ABNORMAL LOW (ref 3.5–5.0)
Alkaline Phosphatase: 57 U/L (ref 38–126)
Anion gap: 13 (ref 5–15)
BUN: 47 mg/dL — ABNORMAL HIGH (ref 8–23)
CO2: 31 mmol/L (ref 22–32)
Calcium: 9 mg/dL (ref 8.9–10.3)
Chloride: 92 mmol/L — ABNORMAL LOW (ref 98–111)
Creatinine, Ser: 1.33 mg/dL — ABNORMAL HIGH (ref 0.44–1.00)
GFR, Estimated: 39 mL/min — ABNORMAL LOW (ref >60)
Glucose, Bld: 83 mg/dL (ref 70–99)
Potassium: 4.1 mmol/L (ref 3.5–5.1)
Sodium: 136 mmol/L (ref 135–145)
Total Bilirubin: 1 mg/dL (ref 0.3–1.2)
Total Protein: 6.5 g/dL (ref 6.5–8.1)

## 2023-08-14 LAB — FOLATE: Folate: 6.5 ng/mL (ref >5.9)

## 2023-08-14 LAB — SODIUM, URINE, RANDOM: Sodium, Ur: 79 mmol/L

## 2023-08-14 LAB — CBC WITH DIFFERENTIAL/PLATELET
Abs Immature Granulocytes: 0.02 10*3/uL (ref 0.00–0.07)
Basophils Absolute: 0 10*3/uL (ref 0.0–0.1)
Basophils Relative: 1 %
Eosinophils Absolute: 0.4 10*3/uL (ref 0.0–0.5)
Eosinophils Relative: 7 %
HCT: 28.9 % — ABNORMAL LOW (ref 36.0–46.0)
Hemoglobin: 9.2 g/dL — ABNORMAL LOW (ref 12.0–15.0)
Immature Granulocytes: 0 %
Lymphocytes Relative: 14 %
Lymphs Abs: 0.9 10*3/uL (ref 0.7–4.0)
MCH: 35.4 pg — ABNORMAL HIGH (ref 26.0–34.0)
MCHC: 31.8 g/dL (ref 30.0–36.0)
MCV: 111.2 fL — ABNORMAL HIGH (ref 80.0–100.0)
Monocytes Absolute: 0.4 10*3/uL (ref 0.1–1.0)
Monocytes Relative: 6 %
Neutro Abs: 4.8 10*3/uL (ref 1.7–7.7)
Neutrophils Relative %: 72 %
Platelets: 175 10*3/uL (ref 150–400)
RBC: 2.6 MIL/uL — ABNORMAL LOW (ref 3.87–5.11)
RDW: 13.1 % (ref 11.5–15.5)
WBC: 6.6 10*3/uL (ref 4.0–10.5)
nRBC: 0 % (ref 0.0–0.2)

## 2023-08-14 LAB — OSMOLALITY, URINE: Osmolality, Ur: 310 mosm/kg (ref 300–900)

## 2023-08-14 LAB — CREATININE, URINE, RANDOM: Creatinine, Urine: 30 mg/dL

## 2023-08-14 LAB — BRAIN NATRIURETIC PEPTIDE: B Natriuretic Peptide: 2468.4 pg/mL — ABNORMAL HIGH (ref 0.0–100.0)

## 2023-08-14 LAB — MAGNESIUM: Magnesium: 2 mg/dL (ref 1.7–2.4)

## 2023-08-14 LAB — PROCALCITONIN: Procalcitonin: <0.1 ng/mL

## 2023-08-14 LAB — VITAMIN B12: Vitamin B-12: 735 pg/mL (ref 180–914)

## 2023-08-14 LAB — TROPONIN I (HIGH SENSITIVITY)
Troponin I (High Sensitivity): 26 ng/L — ABNORMAL HIGH (ref <18)
Troponin I (High Sensitivity): 27 ng/L — ABNORMAL HIGH (ref <18)

## 2023-08-14 LAB — MRSA NEXT GEN BY PCR, NASAL: MRSA by PCR Next Gen: NOT DETECTED

## 2023-08-14 LAB — PHOSPHORUS: Phosphorus: 4.3 mg/dL (ref 2.5–4.6)

## 2023-08-14 MED ORDER — ONDANSETRON HCL 4 MG/2ML IJ SOLN: 4.0000 mg | Freq: Four times a day (QID) | INTRAMUSCULAR | Status: DC | PRN

## 2023-08-14 MED ORDER — FUROSEMIDE 10 MG/ML IJ SOLN
40.0000 mg | Freq: Once | INTRAMUSCULAR | Status: AC
  Administered 2023-08-14: 40 mg via INTRAVENOUS
  Filled 2023-08-14: qty 4

## 2023-08-14 MED ORDER — SPIRONOLACTONE 12.5 MG HALF TABLET
12.5000 mg | ORAL_TABLET | Freq: Every day | ORAL | Status: DC
  Administered 2023-08-14 – 2023-08-15 (×2): 12.5 mg via ORAL
  Filled 2023-08-14 (×2): qty 1

## 2023-08-14 MED ORDER — SUCRALFATE 1 GM/10ML PO SUSP: 1.0000 g | Freq: Four times a day (QID) | ORAL | Status: DC | PRN

## 2023-08-14 MED ORDER — HYDROCORTISONE 1 % EX CREA
1.0000 | TOPICAL_CREAM | Freq: Three times a day (TID) | CUTANEOUS | Status: DC | PRN
  Filled 2023-08-14: qty 28

## 2023-08-14 MED ORDER — FAMOTIDINE 20 MG PO TABS
20.0000 mg | ORAL_TABLET | Freq: Every day | ORAL | Status: DC
  Administered 2023-08-14 (×2): 20 mg via ORAL
  Filled 2023-08-14 (×2): qty 1

## 2023-08-14 MED ORDER — IPRATROPIUM BROMIDE 0.03 % NA SOLN
2.0000 | Freq: Every day | NASAL | Status: DC
  Administered 2023-08-14 (×2): 2 via NASAL
  Filled 2023-08-14: qty 30

## 2023-08-14 MED ORDER — ALBUTEROL SULFATE (2.5 MG/3ML) 0.083% IN NEBU: 2.5000 mg | INHALATION_SOLUTION | RESPIRATORY_TRACT | Status: DC | PRN

## 2023-08-14 MED ORDER — SODIUM CHLORIDE 0.9 % IV SOLN
INTRAVENOUS | Status: AC
  Filled 2023-08-14: qty 500

## 2023-08-14 MED ORDER — SODIUM CHLORIDE 0.9 % IV SOLN
1.0000 g | INTRAVENOUS | Status: DC
  Administered 2023-08-14: 1 g via INTRAVENOUS
  Filled 2023-08-14: qty 10

## 2023-08-14 MED ORDER — HYDROCODONE-ACETAMINOPHEN 5-325 MG PO TABS
1.0000 | ORAL_TABLET | ORAL | Status: DC | PRN
  Administered 2023-08-15: 1 via ORAL
  Filled 2023-08-14: qty 1

## 2023-08-14 MED ORDER — CARMEX CLASSIC LIP BALM EX OINT: 1.0000 | TOPICAL_OINTMENT | CUTANEOUS | Status: DC | PRN

## 2023-08-14 MED ORDER — LATANOPROST 0.005 % OP SOLN
1.0000 [drp] | Freq: Every day | OPHTHALMIC | Status: DC
  Administered 2023-08-14 (×2): 1 [drp] via OPHTHALMIC
  Filled 2023-08-14: qty 2.5

## 2023-08-14 MED ORDER — MIDAZOLAM HCL 2 MG/2ML IJ SOLN
INTRAMUSCULAR | Status: AC
  Filled 2023-08-14: qty 2

## 2023-08-14 MED ORDER — FENTANYL CITRATE (PF) 100 MCG/2ML IJ SOLN
INTRAMUSCULAR | Status: AC | PRN
Start: 2023-08-14 — End: 2023-08-14
  Administered 2023-08-14: 25 ug via INTRAVENOUS

## 2023-08-14 MED ORDER — GUAIFENESIN ER 600 MG PO TB12
600.0000 mg | ORAL_TABLET | Freq: Two times a day (BID) | ORAL | Status: DC
  Administered 2023-08-14 – 2023-08-15 (×4): 600 mg via ORAL
  Filled 2023-08-14 (×4): qty 1

## 2023-08-14 MED ORDER — MIDAZOLAM HCL 2 MG/2ML IJ SOLN
INTRAMUSCULAR | Status: AC | PRN
Start: 2023-08-14 — End: 2023-08-14
  Administered 2023-08-14: .5 mg via INTRAVENOUS

## 2023-08-14 MED ORDER — TORSEMIDE 20 MG PO TABS
20.0000 mg | ORAL_TABLET | Freq: Every day | ORAL | Status: DC
  Administered 2023-08-15: 20 mg via ORAL
  Filled 2023-08-14: qty 1

## 2023-08-14 MED ORDER — ACETAMINOPHEN 325 MG PO TABS
650.0000 mg | ORAL_TABLET | Freq: Four times a day (QID) | ORAL | Status: DC | PRN
  Administered 2023-08-14: 650 mg via ORAL
  Filled 2023-08-14: qty 2

## 2023-08-14 MED ORDER — MACITENTAN 10 MG PO TABS
10.0000 mg | ORAL_TABLET | Freq: Every day | ORAL | Status: DC
  Administered 2023-08-14 – 2023-08-15 (×2): 10 mg via ORAL
  Filled 2023-08-14: qty 1

## 2023-08-14 MED ORDER — ONDANSETRON HCL 4 MG PO TABS: 4.0000 mg | ORAL_TABLET | Freq: Four times a day (QID) | ORAL | Status: DC | PRN

## 2023-08-14 MED ORDER — MONTELUKAST SODIUM 10 MG PO TABS
10.0000 mg | ORAL_TABLET | Freq: Every day | ORAL | Status: DC
  Administered 2023-08-14 (×2): 10 mg via ORAL
  Filled 2023-08-14 (×2): qty 1

## 2023-08-14 MED ORDER — FENTANYL CITRATE (PF) 100 MCG/2ML IJ SOLN
INTRAMUSCULAR | Status: AC
  Filled 2023-08-14: qty 2

## 2023-08-14 MED ORDER — ACETAMINOPHEN 650 MG RE SUPP: 650.0000 mg | Freq: Four times a day (QID) | RECTAL | Status: DC | PRN

## 2023-08-14 MED ORDER — EMPAGLIFLOZIN 10 MG PO TABS
10.0000 mg | ORAL_TABLET | Freq: Every day | ORAL | Status: DC
  Administered 2023-08-14 – 2023-08-15 (×2): 10 mg via ORAL
  Filled 2023-08-14 (×2): qty 1

## 2023-08-14 MED ORDER — FUROSEMIDE 10 MG/ML IJ SOLN
40.0000 mg | Freq: Two times a day (BID) | INTRAMUSCULAR | Status: DC
  Administered 2023-08-14: 40 mg via INTRAVENOUS
  Filled 2023-08-14: qty 4

## 2023-08-14 NOTE — Assessment & Plan Note (Signed)
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

## 2023-08-14 NOTE — Consult Note (Signed)
Cardiology Consultation   Patient ID: Paula Ward MRN: [id number]; DOB: 1936-07-21  Admit date: 08/13/2023 Date of Consult: 08/14/2023  PCP:  [name], MD   [facility] Providers Cardiologist:  Olga Millers, MD        Patient Profile:   Paula Ward is a 87 y.o. female with a hx of scleroderma, CREST syndrome, aortic stenosis, history of PE not on anticoagulation due to severe anemia s/p IVC filter, history of GI bleed with AVM, CKD stage III, chronic diastolic heart failure, pulmonary hypertension on Macitentan, hypertension, orthostatic hypotension and history of pericardial effusion who is being seen 08/14/2023 for the evaluation of CHF at the request of Dr. Frederick Peers.  History of Present Illness:   Ms. Zecher is a 87 year old female with past medical history of scleroderma, CREST syndrome, aortic stenosis, history of PE not on anticoagulation due to severe anemia s/p IVC filter, history of GI bleed with AVM, CKD stage III, chronic diastolic heart failure, pulmonary hypertension on Macitentan, hypertension, orthostatic hypotension and history of pericardial effusion.  She has chronic respiratory failure and has been on nocturnal oxygen since 2023.  Cardiac catheterization performed in July 2023 showed no CAD, mild pulmonary hypertension with PA pressure 52/15 with mean of 28 mmHg, moderate aortic stenosis with transvalvular gradient of 16 mmHg, heavy mitral annular calcification.  She had a fall and sustained a displaced left intertrochanteric femur fracture requiring intramedullary nailing by Dr. Lequita Halt in December 2023.  Although the surgery was in the left hip, however she says ever since the surgery she has been having right lower extremity swelling.  Her right lower extremity has been tender to touch.  Venous Doppler obtained in September 2023 and again in October 2024 was negative for DVT.  Patient was last admitted in August 2020 for was shortness of  breath, chest tightness, lower extremity edema and orthopnea.  Echocardiogram obtained on 06/05/2023 showed EF 70 to 75%, moderate LVH, severe LAE, large pericardial effusion without signs of tamponade, severely calcified mitral valve annulus with trivial MR, moderate aortic stenosis.  Looking back, the large pericardial effusion has been present on echocardiogram since at least October 2003.  BNP was elevated during the hospitalization and the patient underwent IV diuresis.  She was ultimately discharged on 40 mg daily of Lasix and a 12.5 mg daily of spironolactone.    Since discharge, she was initially doing well however had progressive worsening shortness of breath and lower extremity edema for the past 2 weeks.  She called the nurse triage line on 08/04/2023 due to increased leg edema, Lasix was increased to 40 mg twice a day.  Patient was seen by Edd Fabian, NP on 08/06/2023 at which time she mentioned she could not take the Lasix at the twice a day dosing.  Lasix was eventually changed to 60 mg daily for 4 days before going back down to 40 mg daily thereafter.  20 mEq daily of potassium was also added to her medical regimen.  Unfortunately, she did not have significant urine output on the higher dose of Lasix and was eventually admitted to the hospital on 08/13/2023 after having orthopnea and PND episode.  She says she could not sleep on the night of 10/23 due to shortness of breath.  Her pulmonologist Dr. Sherene Sires recently increased her home O2 from 2 L to 3 L.  In the past 2 weeks, she has been essentially using oxygen therapy 24/7 instead of as needed.  On arrival to  Cardiology Consultation   Patient ID: Paula Ward MRN: [id number]; DOB: 1936-07-21  Admit date: 08/13/2023 Date of Consult: 08/14/2023  PCP:  [name], MD   [facility] Providers Cardiologist:  Olga Millers, MD        Patient Profile:   Paula Ward is a 87 y.o. female with a hx of scleroderma, CREST syndrome, aortic stenosis, history of PE not on anticoagulation due to severe anemia s/p IVC filter, history of GI bleed with AVM, CKD stage III, chronic diastolic heart failure, pulmonary hypertension on Macitentan, hypertension, orthostatic hypotension and history of pericardial effusion who is being seen 08/14/2023 for the evaluation of CHF at the request of Dr. Frederick Peers.  History of Present Illness:   Ms. Zecher is a 87 year old female with past medical history of scleroderma, CREST syndrome, aortic stenosis, history of PE not on anticoagulation due to severe anemia s/p IVC filter, history of GI bleed with AVM, CKD stage III, chronic diastolic heart failure, pulmonary hypertension on Macitentan, hypertension, orthostatic hypotension and history of pericardial effusion.  She has chronic respiratory failure and has been on nocturnal oxygen since 2023.  Cardiac catheterization performed in July 2023 showed no CAD, mild pulmonary hypertension with PA pressure 52/15 with mean of 28 mmHg, moderate aortic stenosis with transvalvular gradient of 16 mmHg, heavy mitral annular calcification.  She had a fall and sustained a displaced left intertrochanteric femur fracture requiring intramedullary nailing by Dr. Lequita Halt in December 2023.  Although the surgery was in the left hip, however she says ever since the surgery she has been having right lower extremity swelling.  Her right lower extremity has been tender to touch.  Venous Doppler obtained in September 2023 and again in October 2024 was negative for DVT.  Patient was last admitted in August 2020 for was shortness of  breath, chest tightness, lower extremity edema and orthopnea.  Echocardiogram obtained on 06/05/2023 showed EF 70 to 75%, moderate LVH, severe LAE, large pericardial effusion without signs of tamponade, severely calcified mitral valve annulus with trivial MR, moderate aortic stenosis.  Looking back, the large pericardial effusion has been present on echocardiogram since at least October 2003.  BNP was elevated during the hospitalization and the patient underwent IV diuresis.  She was ultimately discharged on 40 mg daily of Lasix and a 12.5 mg daily of spironolactone.    Since discharge, she was initially doing well however had progressive worsening shortness of breath and lower extremity edema for the past 2 weeks.  She called the nurse triage line on 08/04/2023 due to increased leg edema, Lasix was increased to 40 mg twice a day.  Patient was seen by Edd Fabian, NP on 08/06/2023 at which time she mentioned she could not take the Lasix at the twice a day dosing.  Lasix was eventually changed to 60 mg daily for 4 days before going back down to 40 mg daily thereafter.  20 mEq daily of potassium was also added to her medical regimen.  Unfortunately, she did not have significant urine output on the higher dose of Lasix and was eventually admitted to the hospital on 08/13/2023 after having orthopnea and PND episode.  She says she could not sleep on the night of 10/23 due to shortness of breath.  Her pulmonologist Dr. Sherene Sires recently increased her home O2 from 2 L to 3 L.  In the past 2 weeks, she has been essentially using oxygen therapy 24/7 instead of as needed.  On arrival to  Cardiology Consultation   Patient ID: Paula Ward MRN: [id number]; DOB: 1936-07-21  Admit date: 08/13/2023 Date of Consult: 08/14/2023  PCP:  [name], MD   [facility] Providers Cardiologist:  Olga Millers, MD        Patient Profile:   Paula Ward is a 87 y.o. female with a hx of scleroderma, CREST syndrome, aortic stenosis, history of PE not on anticoagulation due to severe anemia s/p IVC filter, history of GI bleed with AVM, CKD stage III, chronic diastolic heart failure, pulmonary hypertension on Macitentan, hypertension, orthostatic hypotension and history of pericardial effusion who is being seen 08/14/2023 for the evaluation of CHF at the request of Dr. Frederick Peers.  History of Present Illness:   Ms. Zecher is a 87 year old female with past medical history of scleroderma, CREST syndrome, aortic stenosis, history of PE not on anticoagulation due to severe anemia s/p IVC filter, history of GI bleed with AVM, CKD stage III, chronic diastolic heart failure, pulmonary hypertension on Macitentan, hypertension, orthostatic hypotension and history of pericardial effusion.  She has chronic respiratory failure and has been on nocturnal oxygen since 2023.  Cardiac catheterization performed in July 2023 showed no CAD, mild pulmonary hypertension with PA pressure 52/15 with mean of 28 mmHg, moderate aortic stenosis with transvalvular gradient of 16 mmHg, heavy mitral annular calcification.  She had a fall and sustained a displaced left intertrochanteric femur fracture requiring intramedullary nailing by Dr. Lequita Halt in December 2023.  Although the surgery was in the left hip, however she says ever since the surgery she has been having right lower extremity swelling.  Her right lower extremity has been tender to touch.  Venous Doppler obtained in September 2023 and again in October 2024 was negative for DVT.  Patient was last admitted in August 2020 for was shortness of  breath, chest tightness, lower extremity edema and orthopnea.  Echocardiogram obtained on 06/05/2023 showed EF 70 to 75%, moderate LVH, severe LAE, large pericardial effusion without signs of tamponade, severely calcified mitral valve annulus with trivial MR, moderate aortic stenosis.  Looking back, the large pericardial effusion has been present on echocardiogram since at least October 2003.  BNP was elevated during the hospitalization and the patient underwent IV diuresis.  She was ultimately discharged on 40 mg daily of Lasix and a 12.5 mg daily of spironolactone.    Since discharge, she was initially doing well however had progressive worsening shortness of breath and lower extremity edema for the past 2 weeks.  She called the nurse triage line on 08/04/2023 due to increased leg edema, Lasix was increased to 40 mg twice a day.  Patient was seen by Edd Fabian, NP on 08/06/2023 at which time she mentioned she could not take the Lasix at the twice a day dosing.  Lasix was eventually changed to 60 mg daily for 4 days before going back down to 40 mg daily thereafter.  20 mEq daily of potassium was also added to her medical regimen.  Unfortunately, she did not have significant urine output on the higher dose of Lasix and was eventually admitted to the hospital on 08/13/2023 after having orthopnea and PND episode.  She says she could not sleep on the night of 10/23 due to shortness of breath.  Her pulmonologist Dr. Sherene Sires recently increased her home O2 from 2 L to 3 L.  In the past 2 weeks, she has been essentially using oxygen therapy 24/7 instead of as needed.  On arrival to  Lab 08/13/23 1823 08/14/23 0636 08/14/23 0828  WBC 5.6 6.1 6.6  RBC 2.65*  2.57* 2.73* 2.60*  HGB 9.4* 9.6* 9.2*  HCT 30.1* 30.0* 28.9*  MCV 113.6* 109.9* 111.2*  MCH 35.5* 35.2* 35.4*  MCHC 31.2 32.0 31.8  RDW 13.0 13.0 13.1  PLT 170 178 175   Thyroid No results for input(s): "TSH", "FREET4" in the last 168 hours.  BNP Recent Labs  Lab 08/14/23 0636  BNP 2,468.4*    DDimer No results for input(s): "DDIMER" in the last 168 hours.   Radiology/Studies:  DG CHEST PORT 1 VIEW  Result Date: 08/13/2023 CLINICAL DATA:  Shortness of breath.  Leg swelling. EXAM: PORTABLE CHEST 1 VIEW COMPARISON:  06/04/2023, CT 04/23/2022 FINDINGS: Chronic cardiomegaly. There may be a pericardial effusion component due to globular configuration. Increasing hazy opacity at the lung bases suspicious for effusions. There is increased vascular congestion and edema. No pneumothorax. IMPRESSION: 1. Increased vascular congestion and edema. 2. Increasing hazy opacity at the lung bases suspicious for effusions. 3. Chronic cardiomegaly and enlargement of the cardiopericardial silhouette. Possible pericardial effusion component. Electronically Signed   By: Narda Rutherford M.D.   On: 08/13/2023 22:11   DG Tibia/Fibula Right  Result Date: 08/13/2023 CLINICAL DATA:  Leg redness and pain. EXAM: RIGHT TIBIA AND FIBULA - 2 VIEW COMPARISON:  None  Available. FINDINGS: There is no evidence of fracture or other focal bone lesions. Knee and ankle alignment are maintained. No erosions. Mild soft tissue edema. No radiopaque foreign body or soft tissue gas. Chronic appearing linear calcifications posteriorly. IMPRESSION: Mild soft tissue edema. No acute osseous abnormality. Electronically Signed   By: Narda Rutherford M.D.   On: 08/13/2023 21:51   VAS [place] LOWER EXTREMITY VENOUS (DVT) (ONLY [facility] & [facility])  Result Date: 08/13/2023  Lower Venous DVT Study Patient Name:  SHARDASIA BEVINGTON  Date of Exam:   08/13/2023 Medical Rec #: [id number]        Accession #:    [id number] Date of Birth: June 03, 1936        Patient Gender: F Patient Age:   58 years Exam Location:  [facility] Procedure:      VAS [place] LOWER EXTREMITY VENOUS (DVT) Referring Phys: Almeta Monas COUNTRYMAN --------------------------------------------------------------------------------  Indications: Swelling.  Risk Factors: Immobility past pregnancy. Comparison Study: No significant changes seen since previous exam 06/25/22 Performing Technologist: Shona Simpson  Examination Guidelines: A complete evaluation includes B-mode imaging, spectral Doppler, color Doppler, and power Doppler as needed of all accessible portions of each vessel. Bilateral testing is considered an integral part of a complete examination. Limited examinations for reoccurring indications may be performed as noted. The reflux portion of the exam is performed with the patient in reverse Trendelenburg.  +---------+---------------+---------+-----------+----------+--------------+ RIGHT    CompressibilityPhasicitySpontaneityPropertiesThrombus Aging +---------+---------------+---------+-----------+----------+--------------+ CFV      Full           Yes      Yes                                 +---------+---------------+---------+-----------+----------+--------------+ SFJ      Full                                                         +---------+---------------+---------+-----------+----------+--------------+ FV  Cardiology Consultation   Patient ID: Paula Ward MRN: [id number]; DOB: 1936-07-21  Admit date: 08/13/2023 Date of Consult: 08/14/2023  PCP:  [name], MD   [facility] Providers Cardiologist:  Olga Millers, MD        Patient Profile:   Paula Ward is a 87 y.o. female with a hx of scleroderma, CREST syndrome, aortic stenosis, history of PE not on anticoagulation due to severe anemia s/p IVC filter, history of GI bleed with AVM, CKD stage III, chronic diastolic heart failure, pulmonary hypertension on Macitentan, hypertension, orthostatic hypotension and history of pericardial effusion who is being seen 08/14/2023 for the evaluation of CHF at the request of Dr. Frederick Peers.  History of Present Illness:   Ms. Zecher is a 87 year old female with past medical history of scleroderma, CREST syndrome, aortic stenosis, history of PE not on anticoagulation due to severe anemia s/p IVC filter, history of GI bleed with AVM, CKD stage III, chronic diastolic heart failure, pulmonary hypertension on Macitentan, hypertension, orthostatic hypotension and history of pericardial effusion.  She has chronic respiratory failure and has been on nocturnal oxygen since 2023.  Cardiac catheterization performed in July 2023 showed no CAD, mild pulmonary hypertension with PA pressure 52/15 with mean of 28 mmHg, moderate aortic stenosis with transvalvular gradient of 16 mmHg, heavy mitral annular calcification.  She had a fall and sustained a displaced left intertrochanteric femur fracture requiring intramedullary nailing by Dr. Lequita Halt in December 2023.  Although the surgery was in the left hip, however she says ever since the surgery she has been having right lower extremity swelling.  Her right lower extremity has been tender to touch.  Venous Doppler obtained in September 2023 and again in October 2024 was negative for DVT.  Patient was last admitted in August 2020 for was shortness of  breath, chest tightness, lower extremity edema and orthopnea.  Echocardiogram obtained on 06/05/2023 showed EF 70 to 75%, moderate LVH, severe LAE, large pericardial effusion without signs of tamponade, severely calcified mitral valve annulus with trivial MR, moderate aortic stenosis.  Looking back, the large pericardial effusion has been present on echocardiogram since at least October 2003.  BNP was elevated during the hospitalization and the patient underwent IV diuresis.  She was ultimately discharged on 40 mg daily of Lasix and a 12.5 mg daily of spironolactone.    Since discharge, she was initially doing well however had progressive worsening shortness of breath and lower extremity edema for the past 2 weeks.  She called the nurse triage line on 08/04/2023 due to increased leg edema, Lasix was increased to 40 mg twice a day.  Patient was seen by Edd Fabian, NP on 08/06/2023 at which time she mentioned she could not take the Lasix at the twice a day dosing.  Lasix was eventually changed to 60 mg daily for 4 days before going back down to 40 mg daily thereafter.  20 mEq daily of potassium was also added to her medical regimen.  Unfortunately, she did not have significant urine output on the higher dose of Lasix and was eventually admitted to the hospital on 08/13/2023 after having orthopnea and PND episode.  She says she could not sleep on the night of 10/23 due to shortness of breath.  Her pulmonologist Dr. Sherene Sires recently increased her home O2 from 2 L to 3 L.  In the past 2 weeks, she has been essentially using oxygen therapy 24/7 instead of as needed.  On arrival to  Lab 08/13/23 1823 08/14/23 0636 08/14/23 0828  WBC 5.6 6.1 6.6  RBC 2.65*  2.57* 2.73* 2.60*  HGB 9.4* 9.6* 9.2*  HCT 30.1* 30.0* 28.9*  MCV 113.6* 109.9* 111.2*  MCH 35.5* 35.2* 35.4*  MCHC 31.2 32.0 31.8  RDW 13.0 13.0 13.1  PLT 170 178 175   Thyroid No results for input(s): "TSH", "FREET4" in the last 168 hours.  BNP Recent Labs  Lab 08/14/23 0636  BNP 2,468.4*    DDimer No results for input(s): "DDIMER" in the last 168 hours.   Radiology/Studies:  DG CHEST PORT 1 VIEW  Result Date: 08/13/2023 CLINICAL DATA:  Shortness of breath.  Leg swelling. EXAM: PORTABLE CHEST 1 VIEW COMPARISON:  06/04/2023, CT 04/23/2022 FINDINGS: Chronic cardiomegaly. There may be a pericardial effusion component due to globular configuration. Increasing hazy opacity at the lung bases suspicious for effusions. There is increased vascular congestion and edema. No pneumothorax. IMPRESSION: 1. Increased vascular congestion and edema. 2. Increasing hazy opacity at the lung bases suspicious for effusions. 3. Chronic cardiomegaly and enlargement of the cardiopericardial silhouette. Possible pericardial effusion component. Electronically Signed   By: Narda Rutherford M.D.   On: 08/13/2023 22:11   DG Tibia/Fibula Right  Result Date: 08/13/2023 CLINICAL DATA:  Leg redness and pain. EXAM: RIGHT TIBIA AND FIBULA - 2 VIEW COMPARISON:  None  Available. FINDINGS: There is no evidence of fracture or other focal bone lesions. Knee and ankle alignment are maintained. No erosions. Mild soft tissue edema. No radiopaque foreign body or soft tissue gas. Chronic appearing linear calcifications posteriorly. IMPRESSION: Mild soft tissue edema. No acute osseous abnormality. Electronically Signed   By: Narda Rutherford M.D.   On: 08/13/2023 21:51   VAS [place] LOWER EXTREMITY VENOUS (DVT) (ONLY [facility])  Result Date: 08/13/2023  Lower Venous DVT Study Patient Name:  SHARDASIA BEVINGTON  Date of Exam:   08/13/2023 Medical Rec #: [id number]        Accession #:    [id number] Date of Birth: June 03, 1936        Patient Gender: F Patient Age:   58 years Exam Location:  [facility] Procedure:      VAS [place] LOWER EXTREMITY VENOUS (DVT) Referring Phys: Almeta Monas COUNTRYMAN --------------------------------------------------------------------------------  Indications: Swelling.  Risk Factors: Immobility past pregnancy. Comparison Study: No significant changes seen since previous exam 06/25/22 Performing Technologist: Shona Simpson  Examination Guidelines: A complete evaluation includes B-mode imaging, spectral Doppler, color Doppler, and power Doppler as needed of all accessible portions of each vessel. Bilateral testing is considered an integral part of a complete examination. Limited examinations for reoccurring indications may be performed as noted. The reflux portion of the exam is performed with the patient in reverse Trendelenburg.  +---------+---------------+---------+-----------+----------+--------------+ RIGHT    CompressibilityPhasicitySpontaneityPropertiesThrombus Aging +---------+---------------+---------+-----------+----------+--------------+ CFV      Full           Yes      Yes                                 +---------+---------------+---------+-----------+----------+--------------+ SFJ      Full                                                         +---------+---------------+---------+-----------+----------+--------------+ FV  Cardiology Consultation   Patient ID: Paula Ward MRN: [id number]; DOB: 1936-07-21  Admit date: 08/13/2023 Date of Consult: 08/14/2023  PCP:  [name], MD   [facility] Providers Cardiologist:  Olga Millers, MD        Patient Profile:   Paula Ward is a 87 y.o. female with a hx of scleroderma, CREST syndrome, aortic stenosis, history of PE not on anticoagulation due to severe anemia s/p IVC filter, history of GI bleed with AVM, CKD stage III, chronic diastolic heart failure, pulmonary hypertension on Macitentan, hypertension, orthostatic hypotension and history of pericardial effusion who is being seen 08/14/2023 for the evaluation of CHF at the request of Dr. Frederick Peers.  History of Present Illness:   Ms. Zecher is a 87 year old female with past medical history of scleroderma, CREST syndrome, aortic stenosis, history of PE not on anticoagulation due to severe anemia s/p IVC filter, history of GI bleed with AVM, CKD stage III, chronic diastolic heart failure, pulmonary hypertension on Macitentan, hypertension, orthostatic hypotension and history of pericardial effusion.  She has chronic respiratory failure and has been on nocturnal oxygen since 2023.  Cardiac catheterization performed in July 2023 showed no CAD, mild pulmonary hypertension with PA pressure 52/15 with mean of 28 mmHg, moderate aortic stenosis with transvalvular gradient of 16 mmHg, heavy mitral annular calcification.  She had a fall and sustained a displaced left intertrochanteric femur fracture requiring intramedullary nailing by Dr. Lequita Halt in December 2023.  Although the surgery was in the left hip, however she says ever since the surgery she has been having right lower extremity swelling.  Her right lower extremity has been tender to touch.  Venous Doppler obtained in September 2023 and again in October 2024 was negative for DVT.  Patient was last admitted in August 2020 for was shortness of  breath, chest tightness, lower extremity edema and orthopnea.  Echocardiogram obtained on 06/05/2023 showed EF 70 to 75%, moderate LVH, severe LAE, large pericardial effusion without signs of tamponade, severely calcified mitral valve annulus with trivial MR, moderate aortic stenosis.  Looking back, the large pericardial effusion has been present on echocardiogram since at least October 2003.  BNP was elevated during the hospitalization and the patient underwent IV diuresis.  She was ultimately discharged on 40 mg daily of Lasix and a 12.5 mg daily of spironolactone.    Since discharge, she was initially doing well however had progressive worsening shortness of breath and lower extremity edema for the past 2 weeks.  She called the nurse triage line on 08/04/2023 due to increased leg edema, Lasix was increased to 40 mg twice a day.  Patient was seen by Edd Fabian, NP on 08/06/2023 at which time she mentioned she could not take the Lasix at the twice a day dosing.  Lasix was eventually changed to 60 mg daily for 4 days before going back down to 40 mg daily thereafter.  20 mEq daily of potassium was also added to her medical regimen.  Unfortunately, she did not have significant urine output on the higher dose of Lasix and was eventually admitted to the hospital on 08/13/2023 after having orthopnea and PND episode.  She says she could not sleep on the night of 10/23 due to shortness of breath.  Her pulmonologist Dr. Sherene Sires recently increased her home O2 from 2 L to 3 L.  In the past 2 weeks, she has been essentially using oxygen therapy 24/7 instead of as needed.  On arrival to  Lab 08/13/23 1823 08/14/23 0636 08/14/23 0828  WBC 5.6 6.1 6.6  RBC 2.65*  2.57* 2.73* 2.60*  HGB 9.4* 9.6* 9.2*  HCT 30.1* 30.0* 28.9*  MCV 113.6* 109.9* 111.2*  MCH 35.5* 35.2* 35.4*  MCHC 31.2 32.0 31.8  RDW 13.0 13.0 13.1  PLT 170 178 175   Thyroid No results for input(s): "TSH", "FREET4" in the last 168 hours.  BNP Recent Labs  Lab 08/14/23 0636  BNP 2,468.4*    DDimer No results for input(s): "DDIMER" in the last 168 hours.   Radiology/Studies:  DG CHEST PORT 1 VIEW  Result Date: 08/13/2023 CLINICAL DATA:  Shortness of breath.  Leg swelling. EXAM: PORTABLE CHEST 1 VIEW COMPARISON:  06/04/2023, CT 04/23/2022 FINDINGS: Chronic cardiomegaly. There may be a pericardial effusion component due to globular configuration. Increasing hazy opacity at the lung bases suspicious for effusions. There is increased vascular congestion and edema. No pneumothorax. IMPRESSION: 1. Increased vascular congestion and edema. 2. Increasing hazy opacity at the lung bases suspicious for effusions. 3. Chronic cardiomegaly and enlargement of the cardiopericardial silhouette. Possible pericardial effusion component. Electronically Signed   By: Narda Rutherford M.D.   On: 08/13/2023 22:11   DG Tibia/Fibula Right  Result Date: 08/13/2023 CLINICAL DATA:  Leg redness and pain. EXAM: RIGHT TIBIA AND FIBULA - 2 VIEW COMPARISON:  None  Available. FINDINGS: There is no evidence of fracture or other focal bone lesions. Knee and ankle alignment are maintained. No erosions. Mild soft tissue edema. No radiopaque foreign body or soft tissue gas. Chronic appearing linear calcifications posteriorly. IMPRESSION: Mild soft tissue edema. No acute osseous abnormality. Electronically Signed   By: Narda Rutherford M.D.   On: 08/13/2023 21:51   VAS [place] LOWER EXTREMITY VENOUS (DVT) (ONLY [facility] & [facility])  Result Date: 08/13/2023  Lower Venous DVT Study Patient Name:  SHARDASIA BEVINGTON  Date of Exam:   08/13/2023 Medical Rec #: [id number]        Accession #:    [id number] Date of Birth: June 03, 1936        Patient Gender: F Patient Age:   58 years Exam Location:  [facility] Procedure:      VAS [place] LOWER EXTREMITY VENOUS (DVT) Referring Phys: Almeta Monas COUNTRYMAN --------------------------------------------------------------------------------  Indications: Swelling.  Risk Factors: Immobility past pregnancy. Comparison Study: No significant changes seen since previous exam 06/25/22 Performing Technologist: Shona Simpson  Examination Guidelines: A complete evaluation includes B-mode imaging, spectral Doppler, color Doppler, and power Doppler as needed of all accessible portions of each vessel. Bilateral testing is considered an integral part of a complete examination. Limited examinations for reoccurring indications may be performed as noted. The reflux portion of the exam is performed with the patient in reverse Trendelenburg.  +---------+---------------+---------+-----------+----------+--------------+ RIGHT    CompressibilityPhasicitySpontaneityPropertiesThrombus Aging +---------+---------------+---------+-----------+----------+--------------+ CFV      Full           Yes      Yes                                 +---------+---------------+---------+-----------+----------+--------------+ SFJ      Full                                                         +---------+---------------+---------+-----------+----------+--------------+ FV  Cardiology Consultation   Patient ID: Paula Ward MRN: [id number]; DOB: 1936-07-21  Admit date: 08/13/2023 Date of Consult: 08/14/2023  PCP:  [name], MD   [facility] Providers Cardiologist:  Olga Millers, MD        Patient Profile:   Paula Ward is a 87 y.o. female with a hx of scleroderma, CREST syndrome, aortic stenosis, history of PE not on anticoagulation due to severe anemia s/p IVC filter, history of GI bleed with AVM, CKD stage III, chronic diastolic heart failure, pulmonary hypertension on Macitentan, hypertension, orthostatic hypotension and history of pericardial effusion who is being seen 08/14/2023 for the evaluation of CHF at the request of Dr. Frederick Peers.  History of Present Illness:   Ms. Zecher is a 87 year old female with past medical history of scleroderma, CREST syndrome, aortic stenosis, history of PE not on anticoagulation due to severe anemia s/p IVC filter, history of GI bleed with AVM, CKD stage III, chronic diastolic heart failure, pulmonary hypertension on Macitentan, hypertension, orthostatic hypotension and history of pericardial effusion.  She has chronic respiratory failure and has been on nocturnal oxygen since 2023.  Cardiac catheterization performed in July 2023 showed no CAD, mild pulmonary hypertension with PA pressure 52/15 with mean of 28 mmHg, moderate aortic stenosis with transvalvular gradient of 16 mmHg, heavy mitral annular calcification.  She had a fall and sustained a displaced left intertrochanteric femur fracture requiring intramedullary nailing by Dr. Lequita Halt in December 2023.  Although the surgery was in the left hip, however she says ever since the surgery she has been having right lower extremity swelling.  Her right lower extremity has been tender to touch.  Venous Doppler obtained in September 2023 and again in October 2024 was negative for DVT.  Patient was last admitted in August 2020 for was shortness of  breath, chest tightness, lower extremity edema and orthopnea.  Echocardiogram obtained on 06/05/2023 showed EF 70 to 75%, moderate LVH, severe LAE, large pericardial effusion without signs of tamponade, severely calcified mitral valve annulus with trivial MR, moderate aortic stenosis.  Looking back, the large pericardial effusion has been present on echocardiogram since at least October 2003.  BNP was elevated during the hospitalization and the patient underwent IV diuresis.  She was ultimately discharged on 40 mg daily of Lasix and a 12.5 mg daily of spironolactone.    Since discharge, she was initially doing well however had progressive worsening shortness of breath and lower extremity edema for the past 2 weeks.  She called the nurse triage line on 08/04/2023 due to increased leg edema, Lasix was increased to 40 mg twice a day.  Patient was seen by Edd Fabian, NP on 08/06/2023 at which time she mentioned she could not take the Lasix at the twice a day dosing.  Lasix was eventually changed to 60 mg daily for 4 days before going back down to 40 mg daily thereafter.  20 mEq daily of potassium was also added to her medical regimen.  Unfortunately, she did not have significant urine output on the higher dose of Lasix and was eventually admitted to the hospital on 08/13/2023 after having orthopnea and PND episode.  She says she could not sleep on the night of 10/23 due to shortness of breath.  Her pulmonologist Dr. Sherene Sires recently increased her home O2 from 2 L to 3 L.  In the past 2 weeks, she has been essentially using oxygen therapy 24/7 instead of as needed.  On arrival to

## 2023-08-14 NOTE — ED Notes (Signed)
Discussed concerns with Dr. Adela Glimpse regarding 1L IVF Bolus order and IV Lasix order. Pt reports to this nurse that "I just woke up feeling short of breath." Pt in NAD at this time, but SPO2 readings 93% on pt home oxygen setting of 3L. Crackles heard in bilateral posterior lung fields at this time.

## 2023-08-14 NOTE — Assessment & Plan Note (Signed)
-   Right lower extremity duplex negative for DVT -Erythema improved with diuresis and antibiotics.  Still low suspicion for true cellulitis but continued on short course with Keflex at discharge

## 2023-08-14 NOTE — Progress Notes (Signed)
Progress Note    Paula Ward   WUX:[id number]  DOB: 1936-03-14  DOA: 08/13/2023     1 PCP: [name], MD  Initial CC: SOB  Hospital Course: Paula Ward is an 87 yo female with PMH chronic dCHF, pulmonary HTN, scleroderma, CKD3b, pericardial effusion, moderate AS who presented with right leg pain and swelling and some SOB.  She was attempting to have outpatient evaluation with cardiology due to her swelling and shortness of breath as well but presented to the ER instead. CXR showed increased vascular congestion and edema.  She was started on diuresis on admission.  Interval History:  Resting bed when seen this morning.  Dyspnea has improved.  No significant signs of volume overload especially in her legs.  Went down for bone marrow biopsy this morning with radiology.  Assessment and Plan: * Cellulitis - Right lower extremity duplex negative for DVT - Continue Rocephin for empiric treatment for possible cellulitis  Acute on chronic diastolic heart failure (HCC) 2 major or 1 major and 2 minor needed AND evidence of IV diuresis: Major criteria: PND, orthopnea, pulmonary rales/crackles, elevated JVD, S3 present, cardiomegaly, pulmonary edema, weight loss > 4.5 kg in 5 days with CHF treatment  Minor criteria: Bilateral leg edema, nocturnal cough, DOE/SOB, hepatomegaly, pleural effusion, tachycardia > 120  - presented with dyspnea/SOB, LE edema, pulmonary edema - BNP chronically elevated but remains high - patient was attempting to see cardiology prior to admission and requested eval while here; appreciate assistance - s/p diuresis on admission; does not appear to have much overload this morning -Resuming torsemide tomorrow per cardiology recommendations.  She is also started on Jardiance and continued on Aldactone  Chronic respiratory failure with hypoxia and hypercapnia (HCC) - On 3 L chronic oxygen at home - Remains at baseline  Pericardial effusion - Stable per  cardiology with no signs of tamponade  Chronic kidney disease, stage 3b (HCC) - patient has history of CKD3b. Baseline creat ~ 1.2 - 1.3, eGFR~ 37   Moderate aortic stenosis - stable - cardiology following outpatient  - follow up repeat echo this admission   Macrocytic anemia - Stable; etiology presumed from ACD but follows with hematology outpt for further workup - Baseline 9 to 10 g/dL - has undergone workup outpt for macrocytosis and recommended to BM biopsy; able to be performed 10/25 while hospitalized; outpt followup for results  SCLERODERMA - stable disease  Essential hypertension - Continue torsemide and spironolactone  Hyponatremia-resolved as of 08/14/2023 - improved s/p diuresis   Old records reviewed in assessment of this patient  Antimicrobials: Rocephin 10/24 >> current   DVT prophylaxis:  SCDs Start: 08/14/23 0044   Code Status:   Code Status: Full Code  Mobility Assessment (Last 72 Hours)     Mobility Assessment     Row Name 08/14/23 1000 08/14/23 0100         Does patient have an order for bedrest or is patient medically unstable No - Continue assessment No - Continue assessment      What is the highest level of mobility based on the progressive mobility assessment? Level 4 (Walks with assist in room) - Balance while marching in place and cannot step forward and back - Complete Level 5 (Walks with assist in room/hall) - Balance while stepping forward/back and can walk in room with assist - Complete               Barriers to discharge: none Disposition Plan:  Home Status  is: Inpt  Objective: Blood pressure (!) 127/56, pulse 80, temperature 97.6 F (36.4 C), temperature source Oral, resp. rate 20, height 5\' 3"  (1.6 m), weight 49 kg, SpO2 97%.  Examination:  Physical Exam Constitutional:      Appearance: Normal appearance.  HENT:     Head: Normocephalic and atraumatic.     Mouth/Throat:     Mouth: Mucous membranes are moist.  Eyes:      Extraocular Movements: Extraocular movements intact.  Cardiovascular:     Rate and Rhythm: Normal rate and regular rhythm.  Pulmonary:     Effort: Pulmonary effort is normal. No respiratory distress.     Breath sounds: Normal breath sounds. No wheezing.  Abdominal:     General: Bowel sounds are normal. There is no distension.     Palpations: Abdomen is soft.     Tenderness: There is no abdominal tenderness.  Musculoskeletal:        General: Normal range of motion.     Cervical back: Normal range of motion and neck supple.     Right lower leg: Edema (1+) present.  Skin:    General: Skin is warm and dry.     Comments: RLE noted with calor and erythema  Neurological:     General: No focal deficit present.     Mental Status: She is alert.  Psychiatric:        Mood and Affect: Mood normal.      Consultants:  Cardiology  Procedures:    Data Reviewed: Results for orders placed or performed during the hospital encounter of 08/13/23 (from the past 24 hour(s))  CBC with Differential     Status: Abnormal   Collection Time: 08/13/23  6:23 PM  Result Value Ref Range   WBC 5.6 4.0 - 10.5 K/uL   RBC 2.65 (L) 3.87 - 5.11 MIL/uL   Hemoglobin 9.4 (L) 12.0 - 15.0 g/dL   HCT 16.1 (L) 09.6 - 04.5 %   MCV 113.6 (H) 80.0 - 100.0 fL   MCH 35.5 (H) 26.0 - 34.0 pg   MCHC 31.2 30.0 - 36.0 g/dL   RDW 40.9 81.1 - 91.4 %   Platelets 170 150 - 400 K/uL   nRBC 0.0 0.0 - 0.2 %   Neutrophils Relative % 77 %   Neutro Abs 4.3 1.7 - 7.7 K/uL   Lymphocytes Relative 12 %   Lymphs Abs 0.7 0.7 - 4.0 K/uL   Monocytes Relative 5 %   Monocytes Absolute 0.3 0.1 - 1.0 K/uL   Eosinophils Relative 6 %   Eosinophils Absolute 0.3 0.0 - 0.5 K/uL   Basophils Relative 0 %   Basophils Absolute 0.0 0.0 - 0.1 K/uL   Immature Granulocytes 0 %   Abs Immature Granulocytes 0.02 0.00 - 0.07 K/uL  Basic metabolic panel     Status: Abnormal   Collection Time: 08/13/23  6:23 PM  Result Value Ref Range   Sodium 130  (L) 135 - 145 mmol/L   Potassium 4.1 3.5 - 5.1 mmol/L   Chloride 91 (L) 98 - 111 mmol/L   CO2 30 22 - 32 mmol/L   Glucose, Bld 99 70 - 99 mg/dL   BUN 52 (H) 8 - 23 mg/dL   Creatinine, Ser 7.82 (H) 0.44 - 1.00 mg/dL   Calcium 8.5 (L) 8.9 - 10.3 mg/dL   GFR, Estimated 34 (L) >60 mL/min   Anion gap 9 5 - 15  Sedimentation rate     Status: Abnormal  Collection Time: 08/13/23  6:23 PM  Result Value Ref Range   Sed Rate 29 (H) 0 - 22 mm/hr  C-reactive protein     Status: None   Collection Time: 08/13/23  6:23 PM  Result Value Ref Range   CRP 0.7 <1.0 mg/dL  Blood culture (routine x 2)     Status: None (Preliminary result)   Collection Time: 08/13/23  6:23 PM   Specimen: BLOOD  Result Value Ref Range   Specimen Description      BLOOD BLOOD LEFT FOREARM Performed at [facility], [address] [place], [place] [address]    Special Requests      BOTTLES DRAWN AEROBIC AND ANAEROBIC Blood Culture adequate volume Performed at [facility], [address] [place], [place] [address]    Culture      NO GROWTH < 12 HOURS Performed at [facility], [address], [place], [place] [address]    Report Status PENDING   Iron and TIBC     Status: Abnormal   Collection Time: 08/13/23  6:23 PM  Result Value Ref Range   Iron 62 28 - 170 ug/dL   TIBC 132 (L) 440 - 102 ug/dL   Saturation Ratios 30 10.4 - 31.8 %   UIBC 148 ug/dL  Ferritin     Status: Abnormal   Collection Time: 08/13/23  6:23 PM  Result Value Ref Range   Ferritin 810 (H) 11 - 307 ng/mL  Reticulocytes     Status: Abnormal   Collection Time: 08/13/23  6:23 PM  Result Value Ref Range   Retic Ct Pct 1.5 0.4 - 3.1 %   RBC. 2.57 (L) 3.87 - 5.11 MIL/uL   Retic Count, Absolute 38.8 19.0 - 186.0 K/uL   Immature Retic Fract 9.0 2.3 - 15.9 %  Procalcitonin     Status: None   Collection Time: 08/13/23  6:23 PM  Result Value Ref Range   Procalcitonin <0.10 ng/mL  Osmolality     Status:  Abnormal   Collection Time: 08/13/23  6:23 PM  Result Value Ref Range   Osmolality 308 (H) 275 - 295 mOsm/kg  CK     Status: None   Collection Time: 08/13/23  6:23 PM  Result Value Ref Range   Total CK 107 38 - 234 U/L  Hepatic function panel     Status: None   Collection Time: 08/13/23  6:23 PM  Result Value Ref Range   Total Protein 6.6 6.5 - 8.1 g/dL   Albumin 3.9 3.5 - 5.0 g/dL   AST 24 15 - 41 U/L   ALT 13 0 - 44 U/L   Alkaline Phosphatase 69 38 - 126 U/L   Total Bilirubin 0.6 0.3 - 1.2 mg/dL   Bilirubin, Direct 0.1 0.0 - 0.2 mg/dL   Indirect Bilirubin 0.5 0.3 - 0.9 mg/dL  Blood culture (routine x 2)     Status: None (Preliminary result)   Collection Time: 08/13/23  6:51 PM   Specimen: BLOOD  Result Value Ref Range   Specimen Description      BLOOD RIGHT ANTECUBITAL Performed at [facility], [address], [place], [place] [address]    Special Requests      BOTTLES DRAWN AEROBIC AND ANAEROBIC Blood Culture adequate volume Performed at [facility], [address] [place], [place] [address]    Culture      NO GROWTH < 12 HOURS Performed at [facility], [address], [place], [place] [address]    Report Status PENDING   Osmolality, urine     Status: None   Collection Time: 08/14/23 12:37 AM  Result Value Ref Range   Osmolality, Ur 310 300 - 900 mOsm/kg  Sodium, urine, random     Status: None   Collection Time: 08/14/23 12:37 AM  Result Value Ref Range   Sodium, Ur 79 mmol/L  Creatinine, urine, random     Status: None   Collection Time: 08/14/23 12:37 AM  Result Value Ref Range   Creatinine, Urine 30 mg/dL  MRSA Next Gen by PCR, Nasal     Status: None   Collection Time: 08/14/23  1:36 AM   Specimen: Nasal Mucosa; Nasal Swab  Result Value Ref Range   MRSA by PCR Next Gen NOT DETECTED NOT DETECTED  Vitamin B12     Status: None   Collection Time: 08/14/23  6:36 AM  Result Value Ref Range   Vitamin B-12 735 180 -  914 pg/mL  Folate     Status: None   Collection Time: 08/14/23  6:36 AM  Result Value Ref Range   Folate 6.5 >5.9 ng/mL  Brain natriuretic peptide     Status: Abnormal   Collection Time: 08/14/23  6:36 AM  Result Value Ref Range   B Natriuretic Peptide 2,468.4 (H) 0.0 - 100.0 pg/mL  Troponin I (High Sensitivity)     Status: Abnormal   Collection Time: 08/14/23  6:36 AM  Result Value Ref Range   Troponin I (High Sensitivity) 26 (H) <18 ng/L  Magnesium     Status: None   Collection Time: 08/14/23  6:36 AM  Result Value Ref Range   Magnesium 2.0 1.7 - 2.4 mg/dL  Phosphorus     Status: None   Collection Time: 08/14/23  6:36 AM  Result Value Ref Range   Phosphorus 4.3 2.5 - 4.6 mg/dL  Comprehensive metabolic panel     Status: Abnormal   Collection Time: 08/14/23  6:36 AM  Result Value Ref Range   Sodium 136 135 - 145 mmol/L   Potassium 4.1 3.5 - 5.1 mmol/L   Chloride 92 (L) 98 - 111 mmol/L   CO2 31 22 - 32 mmol/L   Glucose, Bld 83 70 - 99 mg/dL   BUN 47 (H) 8 - 23 mg/dL   Creatinine, Ser 6.04 (H) 0.44 - 1.00 mg/dL   Calcium 9.0 8.9 - 54.0 mg/dL   Total Protein 6.5 6.5 - 8.1 g/dL   Albumin 3.4 (L) 3.5 - 5.0 g/dL   AST 21 15 - 41 U/L   ALT 12 0 - 44 U/L   Alkaline Phosphatase 57 38 - 126 U/L   Total Bilirubin 1.0 0.3 - 1.2 mg/dL   GFR, Estimated 39 (L) >60 mL/min   Anion gap 13 5 - 15  CBC     Status: Abnormal   Collection Time: 08/14/23  6:36 AM  Result Value Ref Range   WBC 6.1 4.0 - 10.5 K/uL   RBC 2.73 (L) 3.87 - 5.11 MIL/uL   Hemoglobin 9.6 (L) 12.0 - 15.0 g/dL   HCT 98.1 (L) 19.1 - 47.8 %   MCV 109.9 (H) 80.0 - 100.0 fL   MCH 35.2 (H) 26.0 - 34.0 pg   MCHC 32.0 30.0 - 36.0 g/dL   RDW 29.5 62.1 - 30.8 %   Platelets 178 150 - 400 K/uL   nRBC 0.0 0.0 - 0.2 %  Basic  metabolic panel     Status: Abnormal   Collection Time: 08/14/23  8:28 AM  Result Value Ref Range   Sodium 137 135 - 145 mmol/L   Potassium 4.1 3.5 - 5.1 mmol/L   Chloride 94 (L) 98 - 111 mmol/L   CO2  32 22 - 32 mmol/L   Glucose, Bld 88 70 - 99 mg/dL   BUN 48 (H) 8 - 23 mg/dL   Creatinine, Ser 1.61 (H) 0.44 - 1.00 mg/dL   Calcium 8.8 (L) 8.9 - 10.3 mg/dL   GFR, Estimated 40 (L) >60 mL/min   Anion gap 11 5 - 15  Troponin I (High Sensitivity)     Status: Abnormal   Collection Time: 08/14/23  8:28 AM  Result Value Ref Range   Troponin I (High Sensitivity) 27 (H) <18 ng/L  CBC with Differential/Platelet     Status: Abnormal   Collection Time: 08/14/23  8:28 AM  Result Value Ref Range   WBC 6.6 4.0 - 10.5 K/uL   RBC 2.60 (L) 3.87 - 5.11 MIL/uL   Hemoglobin 9.2 (L) 12.0 - 15.0 g/dL   HCT 09.6 (L) 04.5 - 40.9 %   MCV 111.2 (H) 80.0 - 100.0 fL   MCH 35.4 (H) 26.0 - 34.0 pg   MCHC 31.8 30.0 - 36.0 g/dL   RDW 81.1 91.4 - 78.2 %   Platelets 175 150 - 400 K/uL   nRBC 0.0 0.0 - 0.2 %   Neutrophils Relative % 72 %   Neutro Abs 4.8 1.7 - 7.7 K/uL   Lymphocytes Relative 14 %   Lymphs Abs 0.9 0.7 - 4.0 K/uL   Monocytes Relative 6 %   Monocytes Absolute 0.4 0.1 - 1.0 K/uL   Eosinophils Relative 7 %   Eosinophils Absolute 0.4 0.0 - 0.5 K/uL   Basophils Relative 1 %   Basophils Absolute 0.0 0.0 - 0.1 K/uL   Immature Granulocytes 0 %   Abs Immature Granulocytes 0.02 0.00 - 0.07 K/uL   *Note: Due to a large number of results and/or encounters for the requested time period, some results have not been displayed. A complete set of results can be found in Results Review.    I have reviewed pertinent nursing notes, vitals, labs, and images as necessary. I have ordered labwork to follow up on as indicated.  I have reviewed the last notes from staff over past 24 hours. I have discussed patient's care plan and test results with nursing staff, CM/SW, and other staff as appropriate.  Time spent: Greater than 50% of the 55 minute visit was spent in counseling/coordination of care for the patient as laid out in the A&P.   LOS: 1 day   Lewie Chamber, MD [facility] 08/14/2023, 1:52 PM

## 2023-08-14 NOTE — Assessment & Plan Note (Signed)
-   improved s/p diuresis

## 2023-08-14 NOTE — Progress Notes (Signed)
PT Cancellation Note  Patient Details Name: Paula Ward MRN: [id number] DOB: 1936/03/08   Cancelled Treatment:    Reason Eval/Treat Not Completed: PT screened, no needs identified, will sign off  Spoke with OT.  Pt is mod I for mobility and ambulating with her cane at baseline level.  No further acute PT needs.  Anise Salvo, PT Acute Rehab [facility] [phone]   Rayetta Humphrey 08/14/2023, 6:07 PM

## 2023-08-14 NOTE — Telephone Encounter (Signed)
No Answer/ no voicemail, Tried to call pt, see if pt can come in today or Monday, per [place], would like to see her sooner.voicemail. pt has procedure and Echo today. Tried to connect w/pt between to discuss.

## 2023-08-14 NOTE — Hospital Course (Signed)
Paula Ward is an 87 yo female with PMH chronic dCHF, pulmonary HTN, scleroderma, CKD3b, pericardial effusion, moderate AS who presented with right leg pain and swelling and some SOB.  She was attempting to have outpatient evaluation with cardiology due to her swelling and shortness of breath as well but presented to the ER instead. CXR showed increased vascular congestion and edema.  She was started on diuresis on admission.

## 2023-08-14 NOTE — Plan of Care (Signed)
  Problem: Clinical Measurements: Goal: Ability to avoid or minimize complications of infection will improve Outcome: Progressing   Problem: Skin Integrity: Goal: Skin integrity will improve Outcome: Progressing   Problem: Education: Goal: Knowledge of General Education information will improve Description: Including pain rating scale, medication(s)/side effects and non-pharmacologic comfort measures Outcome: Progressing   Problem: Health Behavior/Discharge Planning: Goal: Ability to manage health-related needs will improve Outcome: Progressing   Problem: Clinical Measurements: Goal: Ability to maintain clinical measurements within normal limits will improve Outcome: Progressing Goal: Will remain free from infection Outcome: Progressing Goal: Diagnostic test results will improve Outcome: Progressing Goal: Respiratory complications will improve Outcome: Progressing Goal: Cardiovascular complication will be avoided Outcome: Progressing   Problem: Activity: Goal: Risk for activity intolerance will decrease Outcome: Progressing   Problem: Nutrition: Goal: Adequate nutrition will be maintained Outcome: Progressing   Problem: Coping: Goal: Level of anxiety will decrease Outcome: Progressing   Problem: Elimination: Goal: Will not experience complications related to bowel motility Outcome: Progressing Goal: Will not experience complications related to urinary retention Outcome: Progressing   Problem: Pain Management: Goal: General experience of comfort will improve Outcome: Progressing   Problem: Safety: Goal: Ability to remain free from injury will improve Outcome: Progressing   Problem: Skin Integrity: Goal: Risk for impaired skin integrity will decrease Outcome: Progressing

## 2023-08-14 NOTE — Assessment & Plan Note (Signed)
-   patient has history of CKD3b. Baseline creat ~ 1.2 - 1.3, eGFR~ 37

## 2023-08-14 NOTE — Telephone Encounter (Signed)
Tried to call pt, see if pt can come in today or Monday, jesse would like to see her sooner.

## 2023-08-14 NOTE — Progress Notes (Signed)
PT Cancellation Note  Patient Details Name: [name] MRN: [id number] DOB: 07-09-1936   Cancelled Treatment:    Reason Eval/Treat Not Completed: Other (comment) Checked on pt in am and had bone marrow biopsy then bedrest.  Will f/u in afternoon as able.  Anise Salvo, PT Acute Rehab [facility] [phone]   Rayetta Humphrey 08/14/2023, 2:33 PM

## 2023-08-14 NOTE — Telephone Encounter (Signed)
Discussed w/Paula Ward pt is still admitted from procedure today. Will call pt Monday to see how she is doing and inform of hosp f/u appt.

## 2023-08-14 NOTE — Evaluation (Signed)
Occupational Therapy Evaluation and Discharge Patient Details Name: Paula Ward MRN: [id number] DOB: [birth year] Today's Date: 08/14/2023   History of Present Illness 87 y.o. female with medical history significant of diastolic CHF, pulmonary HTN, chronic resp failure on 2 L , ILD,     significant for AVM, anemia , CKD and breast cancer, CREST      Presented with  right leg swelling and pain 3 weeks of worsening right leg pain and swelling.   Clinical Impression   Patient evaluated by Occupational Therapy with no further acute OT needs identified. All education has been completed and the patient has no further questions. Pt is close or at her baseline for functional mobility and ADL tasks. SpO2 levels were monitored during evaluation while on 4L O2 and with only room air. See below for readings.  No follow-up Occupational Therapy or equipment needs. OT is signing off. Thank you for this referral.        If plan is discharge home, recommend the following: Assistance with cooking/housework;Assist for transportation    Functional Status Assessment  Patient has not had a recent decline in their functional status  Equipment Recommendations  None recommended by OT       Precautions / Restrictions Precautions Precautions: Fall Restrictions Weight Bearing Restrictions: No      Mobility Bed Mobility Overal bed mobility: Modified Independent     Transfers Overall transfer level: Modified independent Equipment used: Straight cane       Balance Overall balance assessment: Mild deficits observed, not formally tested          ADL either performed or assessed with clinical judgement   ADL Overall ADL's : Modified independent;At baseline               Vision Baseline Vision/History: 0 No visual deficits Ability to See in Adequate Light: 0 Adequate Patient Visual Report: No change from baseline Vision Assessment?: No apparent visual deficits     Perception Perception:  Within Functional Limits       Praxis Praxis: WFL       Pertinent Vitals/Pain Pain Assessment Pain Assessment: Faces Faces Pain Scale: Hurts little more Pain Location: R lower leg pain with touch Pain Descriptors / Indicators: Grimacing Pain Intervention(s): Monitored during session     Extremity/Trunk Assessment Upper Extremity Assessment Upper Extremity Assessment: Overall WFL for tasks assessed   Lower Extremity Assessment Lower Extremity Assessment: Overall WFL for tasks assessed   Cervical / Trunk Assessment Cervical / Trunk Assessment: Normal   Communication Communication Communication: No apparent difficulties   Cognition Arousal: Alert Behavior During Therapy: WFL for tasks assessed/performed Overall Cognitive Status: Within Functional Limits for tasks assessed       General Comments  SpO2 monitored during session. 4L O2 [place] SpO2 high 90's at rest and while walking in room. Walking in room, on room air, SpO2 dropped to low 80's. Once 4L O2 [place] placed on pt, increased time was required before SpO2 levels increased to 90's. While at rest, sitting on room air, SpO2 90-89%            Home Living Family/patient expects to be discharged to:: Private residence Living Arrangements: Spouse/significant other Available Help at Discharge: Family;Available PRN/intermittently (Spouse uses a cane or walker) Type of Home: House Home Access: Stairs to enter [employer] of Steps: 3 Entrance Stairs-Rails: Right Home Layout: One level     Bathroom Shower/Tub: Producer, television/film/video: Standard     Home Equipment: Primary school teacher  bars - tub/shower;Cane - single point;Other (comment);Shower seat;Hand held Programmer, systems (2 wheels)   Additional Comments: Home O2 every night. Use it during the day if needed. Has a pulse ox to check her SpO2      Prior Functioning/Environment Prior Level of Function : Independent/Modified Independent              Mobility Comments: ambulatory with SPC in the community, no DME in the home ADLs Comments: Pt was modified independent with bathing, dressing.  She states she does her cooking sitting on a stool.        OT Problem List: Decreased strength         OT Goals(Current goals can be found in the care plan section) Acute Rehab OT Goals Patient Stated Goal: to brush her teeth OT Goal Formulation: All assessment and education complete, DC therapy  OT Frequency:  1X visit       AM-PAC OT "6 Clicks" Daily Activity     Outcome Measure Help from another person eating meals?: None Help from another person taking care of personal grooming?: None Help from another person toileting, which includes using toliet, bedpan, or urinal?: None Help from another person bathing (including washing, rinsing, drying)?: None Help from another person to put on and taking off regular upper body clothing?: None Help from another person to put on and taking off regular lower body clothing?: None 6 Click Score: 24   End of Session Equipment Utilized During Treatment: Gait belt Nurse Communication: Mobility status;Other (comment) (SpO2 levels with and without supplemental oxygen)  Activity Tolerance: Patient tolerated treatment well;No increased pain Patient left: in chair;with call bell/phone within reach  OT Visit Diagnosis: Muscle weakness (generalized) (M62.81)                Time: 1610-9604 OT Time Calculation (min): 49 min Charges:  OT General Charges $OT Visit: 1 Visit OT Evaluation $OT Eval High Complexity: 1 High OT Treatments $Self Care/Home Management : 8-22 mins $Therapeutic Activity: 8-22 mins  Limmie Patricia, OTR/L,CBIS  Supplemental OT - [facility] and [facility] Secure Chat Preferred    Porschia Willbanks, Charisse March 08/14/2023, 9:23 PM

## 2023-08-14 NOTE — Assessment & Plan Note (Signed)
-   Stable per cardiology with no signs of tamponade

## 2023-08-14 NOTE — Assessment & Plan Note (Signed)
-  stable disease

## 2023-08-14 NOTE — Procedures (Signed)
  Procedure:  CT bone marrow biopsy R iliac Preprocedure diagnosis: Diagnoses of Vitamin B12 deficiency and Anemia, unspecified type were pertinent to this visit. Postprocedure diagnosis: same EBL:    minimal Complications:   none immediate  See full dictation in [employer].  Thora Lance MD Main # [phone] Pager  [phone] Mobile [phone]

## 2023-08-14 NOTE — Telephone Encounter (Signed)
Book at 155pm today, OK to cancel current appt. Or Monday at 245pm ok to cancel current appt.

## 2023-08-14 NOTE — Assessment & Plan Note (Signed)
-   Continue torsemide and spironolactone

## 2023-08-14 NOTE — Assessment & Plan Note (Signed)
-   stable - cardiology following outpatient  - follow up repeat echo this admission

## 2023-08-14 NOTE — Assessment & Plan Note (Signed)
2 major or 1 major and 2 minor needed AND evidence of IV diuresis: Major criteria: PND, orthopnea, pulmonary rales/crackles, elevated JVD, S3 present, cardiomegaly, pulmonary edema, weight loss > 4.5 kg in 5 days with CHF treatment  Minor criteria: Bilateral leg edema, nocturnal cough, DOE/SOB, hepatomegaly, pleural effusion, tachycardia > 120  - presented with dyspnea/SOB, LE edema, pulmonary edema - BNP chronically elevated but remains high - patient was attempting to see cardiology prior to admission and requested eval while here; appreciate assistance - s/p diuresis on admission; does not appear to have much overload this morning -Resuming torsemide tomorrow per cardiology recommendations.  She is also started on Jardiance and continued on Aldactone

## 2023-08-14 NOTE — Assessment & Plan Note (Deleted)
-   Stable - Baseline 9 to 10 g/dL - has undergone workup outpt for macrocytosis

## 2023-08-14 NOTE — Assessment & Plan Note (Signed)
-   On 3 L chronic oxygen at home - Remains at baseline

## 2023-08-15 ENCOUNTER — Other Ambulatory Visit (HOSPITAL_COMMUNITY): Payer: Medicare PPO

## 2023-08-15 DIAGNOSIS — I517 Cardiomegaly: Secondary | ICD-10-CM

## 2023-08-15 DIAGNOSIS — L03115 Cellulitis of right lower limb: Secondary | ICD-10-CM | POA: Diagnosis not present

## 2023-08-15 DIAGNOSIS — I5033 Acute on chronic diastolic (congestive) heart failure: Secondary | ICD-10-CM | POA: Diagnosis not present

## 2023-08-15 LAB — BASIC METABOLIC PANEL
Anion gap: 10 (ref 5–15)
BUN: 45 mg/dL — ABNORMAL HIGH (ref 8–23)
CO2: 33 mmol/L — ABNORMAL HIGH (ref 22–32)
Calcium: 8.8 mg/dL — ABNORMAL LOW (ref 8.9–10.3)
Chloride: 93 mmol/L — ABNORMAL LOW (ref 98–111)
Creatinine, Ser: 1.37 mg/dL — ABNORMAL HIGH (ref 0.44–1.00)
GFR, Estimated: 38 mL/min — ABNORMAL LOW (ref >60)
Glucose, Bld: 85 mg/dL (ref 70–99)
Potassium: 4.9 mmol/L (ref 3.5–5.1)
Sodium: 136 mmol/L (ref 135–145)

## 2023-08-15 LAB — ECHOCARDIOGRAM COMPLETE
AR max vel: 1.15 cm2
AV Area VTI: 1.12 cm2
AV Area mean vel: 1.1 cm2
AV Mean grad: 14.8 mm[Hg]
AV Peak grad: 26.9 mm[Hg]
Ao pk vel: 2.59 m/s
Area-P 1/2: 3.34 cm2
Est EF: >75
Height: 63 in
S' Lateral: 1.9 cm
Weight: 1728.41 [oz_av]

## 2023-08-15 LAB — CBC WITH DIFFERENTIAL/PLATELET
Abs Immature Granulocytes: 0.03 10*3/uL (ref 0.00–0.07)
Basophils Absolute: 0 10*3/uL (ref 0.0–0.1)
Basophils Relative: 0 %
Eosinophils Absolute: 0.4 10*3/uL (ref 0.0–0.5)
Eosinophils Relative: 7 %
HCT: 31.6 % — ABNORMAL LOW (ref 36.0–46.0)
Hemoglobin: 9.9 g/dL — ABNORMAL LOW (ref 12.0–15.0)
Immature Granulocytes: 1 %
Lymphocytes Relative: 13 %
Lymphs Abs: 0.8 10*3/uL (ref 0.7–4.0)
MCH: 34.7 pg — ABNORMAL HIGH (ref 26.0–34.0)
MCHC: 31.3 g/dL (ref 30.0–36.0)
MCV: 110.9 fL — ABNORMAL HIGH (ref 80.0–100.0)
Monocytes Absolute: 0.4 10*3/uL (ref 0.1–1.0)
Monocytes Relative: 7 %
Neutro Abs: 4.8 10*3/uL (ref 1.7–7.7)
Neutrophils Relative %: 72 %
Platelets: 177 10*3/uL (ref 150–400)
RBC: 2.85 MIL/uL — ABNORMAL LOW (ref 3.87–5.11)
RDW: 13.1 % (ref 11.5–15.5)
WBC: 6.5 10*3/uL (ref 4.0–10.5)
nRBC: 0 % (ref 0.0–0.2)

## 2023-08-15 LAB — MAGNESIUM: Magnesium: 2.2 mg/dL (ref 1.7–2.4)

## 2023-08-15 MED ORDER — CEPHALEXIN 250 MG PO CAPS: 250.0000 mg | ORAL_CAPSULE | Freq: Two times a day (BID) | ORAL | 0 refills | Status: AC

## 2023-08-15 MED ORDER — TORSEMIDE 20 MG PO TABS: 20.0000 mg | ORAL_TABLET | Freq: Every day | ORAL | 3 refills | Status: DC

## 2023-08-15 MED ORDER — SPIRONOLACTONE 25 MG PO TABS: 12.5000 mg | ORAL_TABLET | Freq: Every day | ORAL | Status: DC

## 2023-08-15 MED ORDER — EMPAGLIFLOZIN 10 MG PO TABS: 10.0000 mg | ORAL_TABLET | Freq: Every day | ORAL | 3 refills | Status: DC

## 2023-08-15 NOTE — Progress Notes (Signed)
*  PRELIMINARY RESULTS* Echocardiogram 2D Echocardiogram has been performed.  Paula Ward 08/15/2023, 11:04 AM

## 2023-08-15 NOTE — Progress Notes (Signed)
Monitoring by Pharmacy for Pulmonary Hypertension Treatment   Indication - Continuation of prior to admission medication   Patient is 87 y.o.  with history of PAH on chronic macitentan (OPSUMIT) PTA and will be continued while hospitalized.   Continuing this medication order as an inpatient requires that monitoring parameters per REMS requirements must be met.  Chronic therapy is under the supervision of O'Neal (add MD name) who is enrolled in the REMS program and is being notified of continuation of therapy. A staff message in EPIC has been sent notifying the certified prescriber.  Per patient report has previously been educated on Pregnancy risk and Hepatotoxicity. On admission pregnancy risk has been assessed and no monitoring required, and N/A  has been confirmed as birth control option for this patient.  Hepatic function has been evaluated. AST / ALT appropriate to continue medication at this time.     Latest Ref Rng & Units 08/14/2023    6:36 AM 08/13/2023    6:23 PM 06/30/2023    2:35 PM  Hepatic Function  Total Protein 6.5 - 8.1 g/dL 6.5  6.6  6.6   Albumin 3.5 - 5.0 g/dL 3.4  3.9  4.1   AST 15 - 41 U/L 21  24  20    ALT 0 - 44 U/L 12  13  7    Alk Phosphatase 38 - 126 U/L 57  69  66   Total Bilirubin 0.3 - 1.2 mg/dL 1.0  0.6  0.7   Bilirubin, Direct 0.0 - 0.2 mg/dL  0.1      If any question arise or pregnancy is identified during hospitalization, contact for bosentan: [phone]; macitentan: [phone]; ambrisentan: [phone].  Thank for you allowing [place] to participate in the care of this patient.   Ansley Stanwood S. Merilynn Finland, PharmD, BCPS Clinical Staff Pharmacist [email or web address] Pasty Spillers 08/15/2023, 11:21 AM Clinical Pharmacist  Guides for Female Patient:  ambrisentan (LETAIRIS), macitentan (OPSUMIT), bosentan (TRACLEER).

## 2023-08-15 NOTE — Discharge Summary (Signed)
Vmean):   1.10 cm      PV Peak grad:  4.8 mmHg AV Area (VTI):     1.12 cm AV Vmax:           259.40 cm/s AV Vmean:          178.400 cm/s AV VTI:            0.454 m AV Peak Grad:      26.9 mmHg AV Mean Grad:      14.8 mmHg LVOT Vmax:         131.00 cm/s LVOT Vmean:        86.400 cm/s LVOT VTI:          0.224 m LVOT/AV VTI ratio: 0.49  AORTA Ao Root diam: 2.60 cm Ao Asc diam:  2.80 cm MITRAL VALVE                TRICUSPID VALVE MV Area (PHT): 3.34 cm     TR Peak grad:   49.6 mmHg MV Decel Time: 227 msec     TR Vmax:        352.00 cm/s MV E velocity: 125.00 cm/s MV A velocity: 167.00 cm/s  SHUNTS MV E/A ratio:  0.75         Systemic VTI:  0.22 m                             Systemic Diam: 1.70 cm Donato Schultz MD Electronically signed by Donato Schultz MD Signature Date/Time: 08/15/2023/12:37:48 PM    Final    VAS [place] LOWER EXTREMITY VENOUS (DVT) (ONLY [facility])  Result Date: 08/14/2023  Lower Venous DVT Study Patient Name:  Paula Ward  Date of Exam:   08/13/2023 Medical Rec #: [id number]        Accession #:    [id number] Date of Birth: [birth year]        Patient Gender: F Patient Age:   87 years Exam Location:   [facility] Procedure:      VAS [place] LOWER EXTREMITY VENOUS (DVT) Referring Phys: Almeta Monas COUNTRYMAN --------------------------------------------------------------------------------  Indications: Swelling.  Risk Factors: Immobility past pregnancy. Comparison Study: No significant changes seen since previous exam 06/25/22 Performing Technologist: Shona Simpson  Examination Guidelines: A complete evaluation includes B-mode imaging, spectral Doppler, color Doppler, and power Doppler as needed of all accessible portions of each vessel. Bilateral testing is considered an integral part of a complete examination. Limited examinations for reoccurring indications may be performed as noted. The reflux portion of the exam is performed with the patient in reverse Trendelenburg.  +---------+---------------+---------+-----------+----------+--------------+ RIGHT    CompressibilityPhasicitySpontaneityPropertiesThrombus Aging +---------+---------------+---------+-----------+----------+--------------+ CFV      Full           Yes      Yes                                 +---------+---------------+---------+-----------+----------+--------------+ SFJ      Full                                                        +---------+---------------+---------+-----------+----------+--------------+ FV Prox  Full                                                        +---------+---------------+---------+-----------+----------+--------------+  Physician Discharge Summary   Paula Ward JXB:[id number] DOB: [birth year] DOA: 08/13/2023  PCP: Paula Broker, MD  Admit date: 08/13/2023 Discharge date: 08/15/2023   Admitted From: Home Disposition:  Home Discharging physician: Lewie Chamber, MD Barriers to discharge: none  Recommendations at discharge: Follow up with cardiology  Discharge Condition: stable CODE STATUS: Full Diet recommendation:  Diet Orders (From admission, onward)     Start     Ordered   08/15/23 0000  Diet - low sodium heart healthy        08/15/23 1229   08/14/23 0044  Diet Heart Room service appropriate? Yes; Fluid consistency: Thin  Diet effective now       Question Answer Comment  Room service appropriate? Yes   Fluid consistency: Thin      08/14/23 0043            Hospital Course: [name] is an 87 yo female with PMH chronic dCHF, pulmonary HTN, scleroderma, CKD3b, pericardial effusion, moderate AS who presented with right leg pain and swelling and some SOB.  She was attempting to have outpatient evaluation with cardiology due to her swelling and shortness of breath as well but presented to the ER instead. CXR showed increased vascular congestion and edema.  She was started on diuresis on admission. She was evaluated by cardiology and transitioned to torsemide and jardiance.  Echo was repeated on admission which showed no changes compared to prior echo from August.  Known underlying severe LVH, grade 1 DD, large pericardial effusion without tamponade.  She had improvement in dyspnea with diuresis and will follow-up outpatient with cardiology.  Assessment and Plan: * Cellulitis - Right lower extremity duplex negative for DVT -Erythema improved with diuresis and antibiotics.  Still low suspicion for true cellulitis but continued on short course with Keflex at discharge  Acute on chronic diastolic heart failure (HCC) - presented with dyspnea/SOB, LE edema, pulmonary edema - BNP  chronically elevated but remains high - patient was attempting to see cardiology prior to admission and requested eval while here; appreciate assistance - s/p diuresis on admission; does not appear to have much overload at d/c; 1+ LE edema - continued on torsemide at discharge and jardiance started per cardiology recommendations  - continued on aldactone - monitor renal fxn while on Jardiance  - outpt follow up with cardiology   Chronic respiratory failure with hypoxia and hypercapnia (HCC) - On 3 L chronic oxygen at home - Remains at baseline  Pericardial effusion - Stable per cardiology with no signs of tamponade  Macrocytic anemia - Stable; 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 Baseline creat ~ 1.2 - 1.3, eGFR~ 37   Moderate aortic stenosis - stable - cardiology following outpatient   SCLERODERMA - stable disease  Essential hypertension - Continue torsemide and spironolactone  Hyponatremia-resolved as of 08/14/2023 - improved s/p diuresis   The patient's acute and chronic medical conditions were treated accordingly. On day of discharge, patient was felt deemed stable for discharge. Patient/family member advised to call PCP or come back to ER if needed.   Principal Diagnosis: Cellulitis  Discharge Diagnoses: Active Hospital Problems   Diagnosis Date Noted   Cellulitis 08/13/2023    Priority: 1.  Final    BOTTLES DRAWN AEROBIC AND ANAEROBIC Blood Culture adequate volume Performed at [facility], [address], [place], [place] [address]    Culture   Final    NO GROWTH 2 DAYS Performed at [facility], [address] [place], [place] [address]    Report Status PENDING  Incomplete  MRSA Next Gen by PCR, Nasal     Status: None   Collection Time: 08/14/23  1:36 AM   Specimen: Nasal Mucosa; Nasal Swab  Result Value Ref Range Status   MRSA by PCR Next Gen NOT DETECTED NOT DETECTED Final    Comment: (NOTE) The GeneXpert MRSA Assay (FDA approved for NASAL specimens only), is one component of a comprehensive MRSA colonization surveillance program. It is not intended to diagnose MRSA infection nor to guide or monitor treatment for MRSA infections. Test performance is not FDA approved in patients less than 43 years old. Performed at [facility], [address], [place], [place] [address]     Procedures/Studies: ECHOCARDIOGRAM COMPLETE  Result Date: 08/15/2023    ECHOCARDIOGRAM REPORT   Patient Name:   Paula Ward Date of Exam: 08/15/2023 Medical Rec #:  [id number]       Height:       63.0 in Accession #:    [id number]      Weight:       108.0 lb Date of Birth:  [birth year]       BSA:          1.488 m Patient  Age:    86 years        BP:           158/58 mmHg Patient Gender: F               HR:           93 bpm. Exam Location:  Inpatient Procedure: Cardiac Doppler and Color Doppler Indications:    Cardiomegaly I51.7  History:        Patient has prior history of Echocardiogram examinations, most                 recent 06/05/2023. Cardiomegaly and CHF, Pericardial Effusion,                 Aortic Valve Disease; Risk Factors:Hypertension.  Sonographer:    Dondra Prader RVT RCS Referring Phys: [id number] ANASTASSIA DOUTOVA  Sonographer Comments: Technically challenging study due to limited acoustic windows and Technically difficult study due to poor echo windows. IMPRESSIONS  1. Left ventricular ejection fraction, by estimation, is >75%. The left ventricle has hyperdynamic function. The left ventricle has no regional wall motion abnormalities. There is severe left ventricular hypertrophy. Left ventricular diastolic parameters are consistent with Grade I diastolic dysfunction (impaired relaxation).  2. Right ventricular systolic function is normal. The right ventricular size is normal.  3. Left atrial size was severely dilated.  4. Large pericardial effusion. The pericardial effusion is circumferential. There is no evidence of cardiac tamponade.  5. The mitral valve is normal in structure. No evidence of mitral valve regurgitation. No evidence of mitral stenosis. Severe mitral annular calcification.  6. The aortic valve is normal in structure. There is severe calcifcation of the aortic valve. There is moderate thickening of the aortic valve. Aortic valve regurgitation is not visualized. Mild aortic valve stenosis. Aortic valve mean gradient measures  14.8 mmHg. Aortic valve Vmax measures 2.59 m/s.  7. The inferior vena cava is  Vmean):   1.10 cm      PV Peak grad:  4.8 mmHg AV Area (VTI):     1.12 cm AV Vmax:           259.40 cm/s AV Vmean:          178.400 cm/s AV VTI:            0.454 m AV Peak Grad:      26.9 mmHg AV Mean Grad:      14.8 mmHg LVOT Vmax:         131.00 cm/s LVOT Vmean:        86.400 cm/s LVOT VTI:          0.224 m LVOT/AV VTI ratio: 0.49  AORTA Ao Root diam: 2.60 cm Ao Asc diam:  2.80 cm MITRAL VALVE                TRICUSPID VALVE MV Area (PHT): 3.34 cm     TR Peak grad:   49.6 mmHg MV Decel Time: 227 msec     TR Vmax:        352.00 cm/s MV E velocity: 125.00 cm/s MV A velocity: 167.00 cm/s  SHUNTS MV E/A ratio:  0.75         Systemic VTI:  0.22 m                             Systemic Diam: 1.70 cm Donato Schultz MD Electronically signed by Donato Schultz MD Signature Date/Time: 08/15/2023/12:37:48 PM    Final    VAS [place] LOWER EXTREMITY VENOUS (DVT) (ONLY [facility])  Result Date: 08/14/2023  Lower Venous DVT Study Patient Name:  Paula Ward  Date of Exam:   08/13/2023 Medical Rec #: [id number]        Accession #:    [id number] Date of Birth: [birth year]        Patient Gender: F Patient Age:   87 years Exam Location:   [facility] Procedure:      VAS [place] LOWER EXTREMITY VENOUS (DVT) Referring Phys: Almeta Monas COUNTRYMAN --------------------------------------------------------------------------------  Indications: Swelling.  Risk Factors: Immobility past pregnancy. Comparison Study: No significant changes seen since previous exam 06/25/22 Performing Technologist: Shona Simpson  Examination Guidelines: A complete evaluation includes B-mode imaging, spectral Doppler, color Doppler, and power Doppler as needed of all accessible portions of each vessel. Bilateral testing is considered an integral part of a complete examination. Limited examinations for reoccurring indications may be performed as noted. The reflux portion of the exam is performed with the patient in reverse Trendelenburg.  +---------+---------------+---------+-----------+----------+--------------+ RIGHT    CompressibilityPhasicitySpontaneityPropertiesThrombus Aging +---------+---------------+---------+-----------+----------+--------------+ CFV      Full           Yes      Yes                                 +---------+---------------+---------+-----------+----------+--------------+ SFJ      Full                                                        +---------+---------------+---------+-----------+----------+--------------+ FV Prox  Full                                                        +---------+---------------+---------+-----------+----------+--------------+  Vmean):   1.10 cm      PV Peak grad:  4.8 mmHg AV Area (VTI):     1.12 cm AV Vmax:           259.40 cm/s AV Vmean:          178.400 cm/s AV VTI:            0.454 m AV Peak Grad:      26.9 mmHg AV Mean Grad:      14.8 mmHg LVOT Vmax:         131.00 cm/s LVOT Vmean:        86.400 cm/s LVOT VTI:          0.224 m LVOT/AV VTI ratio: 0.49  AORTA Ao Root diam: 2.60 cm Ao Asc diam:  2.80 cm MITRAL VALVE                TRICUSPID VALVE MV Area (PHT): 3.34 cm     TR Peak grad:   49.6 mmHg MV Decel Time: 227 msec     TR Vmax:        352.00 cm/s MV E velocity: 125.00 cm/s MV A velocity: 167.00 cm/s  SHUNTS MV E/A ratio:  0.75         Systemic VTI:  0.22 m                             Systemic Diam: 1.70 cm Donato Schultz MD Electronically signed by Donato Schultz MD Signature Date/Time: 08/15/2023/12:37:48 PM    Final    VAS [place] LOWER EXTREMITY VENOUS (DVT) (ONLY [facility])  Result Date: 08/14/2023  Lower Venous DVT Study Patient Name:  Paula Ward  Date of Exam:   08/13/2023 Medical Rec #: [id number]        Accession #:    [id number] Date of Birth: [birth year]        Patient Gender: F Patient Age:   87 years Exam Location:   [facility] Procedure:      VAS [place] LOWER EXTREMITY VENOUS (DVT) Referring Phys: Almeta Monas COUNTRYMAN --------------------------------------------------------------------------------  Indications: Swelling.  Risk Factors: Immobility past pregnancy. Comparison Study: No significant changes seen since previous exam 06/25/22 Performing Technologist: Shona Simpson  Examination Guidelines: A complete evaluation includes B-mode imaging, spectral Doppler, color Doppler, and power Doppler as needed of all accessible portions of each vessel. Bilateral testing is considered an integral part of a complete examination. Limited examinations for reoccurring indications may be performed as noted. The reflux portion of the exam is performed with the patient in reverse Trendelenburg.  +---------+---------------+---------+-----------+----------+--------------+ RIGHT    CompressibilityPhasicitySpontaneityPropertiesThrombus Aging +---------+---------------+---------+-----------+----------+--------------+ CFV      Full           Yes      Yes                                 +---------+---------------+---------+-----------+----------+--------------+ SFJ      Full                                                        +---------+---------------+---------+-----------+----------+--------------+ FV Prox  Full                                                        +---------+---------------+---------+-----------+----------+--------------+  Vmean):   1.10 cm      PV Peak grad:  4.8 mmHg AV Area (VTI):     1.12 cm AV Vmax:           259.40 cm/s AV Vmean:          178.400 cm/s AV VTI:            0.454 m AV Peak Grad:      26.9 mmHg AV Mean Grad:      14.8 mmHg LVOT Vmax:         131.00 cm/s LVOT Vmean:        86.400 cm/s LVOT VTI:          0.224 m LVOT/AV VTI ratio: 0.49  AORTA Ao Root diam: 2.60 cm Ao Asc diam:  2.80 cm MITRAL VALVE                TRICUSPID VALVE MV Area (PHT): 3.34 cm     TR Peak grad:   49.6 mmHg MV Decel Time: 227 msec     TR Vmax:        352.00 cm/s MV E velocity: 125.00 cm/s MV A velocity: 167.00 cm/s  SHUNTS MV E/A ratio:  0.75         Systemic VTI:  0.22 m                             Systemic Diam: 1.70 cm Donato Schultz MD Electronically signed by Donato Schultz MD Signature Date/Time: 08/15/2023/12:37:48 PM    Final    VAS [place] LOWER EXTREMITY VENOUS (DVT) (ONLY [facility])  Result Date: 08/14/2023  Lower Venous DVT Study Patient Name:  Paula Ward  Date of Exam:   08/13/2023 Medical Rec #: [id number]        Accession #:    [id number] Date of Birth: [birth year]        Patient Gender: F Patient Age:   87 years Exam Location:   [facility] Procedure:      VAS [place] LOWER EXTREMITY VENOUS (DVT) Referring Phys: Almeta Monas COUNTRYMAN --------------------------------------------------------------------------------  Indications: Swelling.  Risk Factors: Immobility past pregnancy. Comparison Study: No significant changes seen since previous exam 06/25/22 Performing Technologist: Shona Simpson  Examination Guidelines: A complete evaluation includes B-mode imaging, spectral Doppler, color Doppler, and power Doppler as needed of all accessible portions of each vessel. Bilateral testing is considered an integral part of a complete examination. Limited examinations for reoccurring indications may be performed as noted. The reflux portion of the exam is performed with the patient in reverse Trendelenburg.  +---------+---------------+---------+-----------+----------+--------------+ RIGHT    CompressibilityPhasicitySpontaneityPropertiesThrombus Aging +---------+---------------+---------+-----------+----------+--------------+ CFV      Full           Yes      Yes                                 +---------+---------------+---------+-----------+----------+--------------+ SFJ      Full                                                        +---------+---------------+---------+-----------+----------+--------------+ FV Prox  Full                                                        +---------+---------------+---------+-----------+----------+--------------+  Physician Discharge Summary   Paula Ward JXB:[id number] DOB: [birth year] DOA: 08/13/2023  PCP: Paula Broker, MD  Admit date: 08/13/2023 Discharge date: 08/15/2023   Admitted From: Home Disposition:  Home Discharging physician: Lewie Chamber, MD Barriers to discharge: none  Recommendations at discharge: Follow up with cardiology  Discharge Condition: stable CODE STATUS: Full Diet recommendation:  Diet Orders (From admission, onward)     Start     Ordered   08/15/23 0000  Diet - low sodium heart healthy        08/15/23 1229   08/14/23 0044  Diet Heart Room service appropriate? Yes; Fluid consistency: Thin  Diet effective now       Question Answer Comment  Room service appropriate? Yes   Fluid consistency: Thin      08/14/23 0043            Hospital Course: [name] is an 87 yo female with PMH chronic dCHF, pulmonary HTN, scleroderma, CKD3b, pericardial effusion, moderate AS who presented with right leg pain and swelling and some SOB.  She was attempting to have outpatient evaluation with cardiology due to her swelling and shortness of breath as well but presented to the ER instead. CXR showed increased vascular congestion and edema.  She was started on diuresis on admission. She was evaluated by cardiology and transitioned to torsemide and jardiance.  Echo was repeated on admission which showed no changes compared to prior echo from August.  Known underlying severe LVH, grade 1 DD, large pericardial effusion without tamponade.  She had improvement in dyspnea with diuresis and will follow-up outpatient with cardiology.  Assessment and Plan: * Cellulitis - Right lower extremity duplex negative for DVT -Erythema improved with diuresis and antibiotics.  Still low suspicion for true cellulitis but continued on short course with Keflex at discharge  Acute on chronic diastolic heart failure (HCC) - presented with dyspnea/SOB, LE edema, pulmonary edema - BNP  chronically elevated but remains high - patient was attempting to see cardiology prior to admission and requested eval while here; appreciate assistance - s/p diuresis on admission; does not appear to have much overload at d/c; 1+ LE edema - continued on torsemide at discharge and jardiance started per cardiology recommendations  - continued on aldactone - monitor renal fxn while on Jardiance  - outpt follow up with cardiology   Chronic respiratory failure with hypoxia and hypercapnia (HCC) - On 3 L chronic oxygen at home - Remains at baseline  Pericardial effusion - Stable per cardiology with no signs of tamponade  Macrocytic anemia - Stable; 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 Baseline creat ~ 1.2 - 1.3, eGFR~ 37   Moderate aortic stenosis - stable - cardiology following outpatient   SCLERODERMA - stable disease  Essential hypertension - Continue torsemide and spironolactone  Hyponatremia-resolved as of 08/14/2023 - improved s/p diuresis   The patient's acute and chronic medical conditions were treated accordingly. On day of discharge, patient was felt deemed stable for discharge. Patient/family member advised to call PCP or come back to ER if needed.   Principal Diagnosis: Cellulitis  Discharge Diagnoses: Active Hospital Problems   Diagnosis Date Noted   Cellulitis 08/13/2023    Priority: 1.  Final    BOTTLES DRAWN AEROBIC AND ANAEROBIC Blood Culture adequate volume Performed at [facility], [address], [place], [place] [address]    Culture   Final    NO GROWTH 2 DAYS Performed at [facility], [address] [place], [place] [address]    Report Status PENDING  Incomplete  MRSA Next Gen by PCR, Nasal     Status: None   Collection Time: 08/14/23  1:36 AM   Specimen: Nasal Mucosa; Nasal Swab  Result Value Ref Range Status   MRSA by PCR Next Gen NOT DETECTED NOT DETECTED Final    Comment: (NOTE) The GeneXpert MRSA Assay (FDA approved for NASAL specimens only), is one component of a comprehensive MRSA colonization surveillance program. It is not intended to diagnose MRSA infection nor to guide or monitor treatment for MRSA infections. Test performance is not FDA approved in patients less than 43 years old. Performed at [facility], [address], [place], [place] [address]     Procedures/Studies: ECHOCARDIOGRAM COMPLETE  Result Date: 08/15/2023    ECHOCARDIOGRAM REPORT   Patient Name:   Paula Ward Date of Exam: 08/15/2023 Medical Rec #:  [id number]       Height:       63.0 in Accession #:    [id number]      Weight:       108.0 lb Date of Birth:  [birth year]       BSA:          1.488 m Patient  Age:    86 years        BP:           158/58 mmHg Patient Gender: F               HR:           93 bpm. Exam Location:  Inpatient Procedure: Cardiac Doppler and Color Doppler Indications:    Cardiomegaly I51.7  History:        Patient has prior history of Echocardiogram examinations, most                 recent 06/05/2023. Cardiomegaly and CHF, Pericardial Effusion,                 Aortic Valve Disease; Risk Factors:Hypertension.  Sonographer:    Dondra Prader RVT RCS Referring Phys: [id number] ANASTASSIA DOUTOVA  Sonographer Comments: Technically challenging study due to limited acoustic windows and Technically difficult study due to poor echo windows. IMPRESSIONS  1. Left ventricular ejection fraction, by estimation, is >75%. The left ventricle has hyperdynamic function. The left ventricle has no regional wall motion abnormalities. There is severe left ventricular hypertrophy. Left ventricular diastolic parameters are consistent with Grade I diastolic dysfunction (impaired relaxation).  2. Right ventricular systolic function is normal. The right ventricular size is normal.  3. Left atrial size was severely dilated.  4. Large pericardial effusion. The pericardial effusion is circumferential. There is no evidence of cardiac tamponade.  5. The mitral valve is normal in structure. No evidence of mitral valve regurgitation. No evidence of mitral stenosis. Severe mitral annular calcification.  6. The aortic valve is normal in structure. There is severe calcifcation of the aortic valve. There is moderate thickening of the aortic valve. Aortic valve regurgitation is not visualized. Mild aortic valve stenosis. Aortic valve mean gradient measures  14.8 mmHg. Aortic valve Vmax measures 2.59 m/s.  7. The inferior vena cava is  Vmean):   1.10 cm      PV Peak grad:  4.8 mmHg AV Area (VTI):     1.12 cm AV Vmax:           259.40 cm/s AV Vmean:          178.400 cm/s AV VTI:            0.454 m AV Peak Grad:      26.9 mmHg AV Mean Grad:      14.8 mmHg LVOT Vmax:         131.00 cm/s LVOT Vmean:        86.400 cm/s LVOT VTI:          0.224 m LVOT/AV VTI ratio: 0.49  AORTA Ao Root diam: 2.60 cm Ao Asc diam:  2.80 cm MITRAL VALVE                TRICUSPID VALVE MV Area (PHT): 3.34 cm     TR Peak grad:   49.6 mmHg MV Decel Time: 227 msec     TR Vmax:        352.00 cm/s MV E velocity: 125.00 cm/s MV A velocity: 167.00 cm/s  SHUNTS MV E/A ratio:  0.75         Systemic VTI:  0.22 m                             Systemic Diam: 1.70 cm Donato Schultz MD Electronically signed by Donato Schultz MD Signature Date/Time: 08/15/2023/12:37:48 PM    Final    VAS [place] LOWER EXTREMITY VENOUS (DVT) (ONLY [facility] & [facility])  Result Date: 08/14/2023  Lower Venous DVT Study Patient Name:  Paula Ward  Date of Exam:   08/13/2023 Medical Rec #: [id number]        Accession #:    [id number] Date of Birth: [birth year]        Patient Gender: F Patient Age:   87 years Exam Location:   [facility] Procedure:      VAS [place] LOWER EXTREMITY VENOUS (DVT) Referring Phys: Almeta Monas COUNTRYMAN --------------------------------------------------------------------------------  Indications: Swelling.  Risk Factors: Immobility past pregnancy. Comparison Study: No significant changes seen since previous exam 06/25/22 Performing Technologist: Shona Simpson  Examination Guidelines: A complete evaluation includes B-mode imaging, spectral Doppler, color Doppler, and power Doppler as needed of all accessible portions of each vessel. Bilateral testing is considered an integral part of a complete examination. Limited examinations for reoccurring indications may be performed as noted. The reflux portion of the exam is performed with the patient in reverse Trendelenburg.  +---------+---------------+---------+-----------+----------+--------------+ RIGHT    CompressibilityPhasicitySpontaneityPropertiesThrombus Aging +---------+---------------+---------+-----------+----------+--------------+ CFV      Full           Yes      Yes                                 +---------+---------------+---------+-----------+----------+--------------+ SFJ      Full                                                        +---------+---------------+---------+-----------+----------+--------------+ FV Prox  Full                                                        +---------+---------------+---------+-----------+----------+--------------+  Vmean):   1.10 cm      PV Peak grad:  4.8 mmHg AV Area (VTI):     1.12 cm AV Vmax:           259.40 cm/s AV Vmean:          178.400 cm/s AV VTI:            0.454 m AV Peak Grad:      26.9 mmHg AV Mean Grad:      14.8 mmHg LVOT Vmax:         131.00 cm/s LVOT Vmean:        86.400 cm/s LVOT VTI:          0.224 m LVOT/AV VTI ratio: 0.49  AORTA Ao Root diam: 2.60 cm Ao Asc diam:  2.80 cm MITRAL VALVE                TRICUSPID VALVE MV Area (PHT): 3.34 cm     TR Peak grad:   49.6 mmHg MV Decel Time: 227 msec     TR Vmax:        352.00 cm/s MV E velocity: 125.00 cm/s MV A velocity: 167.00 cm/s  SHUNTS MV E/A ratio:  0.75         Systemic VTI:  0.22 m                             Systemic Diam: 1.70 cm Donato Schultz MD Electronically signed by Donato Schultz MD Signature Date/Time: 08/15/2023/12:37:48 PM    Final    VAS [place] LOWER EXTREMITY VENOUS (DVT) (ONLY [facility])  Result Date: 08/14/2023  Lower Venous DVT Study Patient Name:  Paula Ward  Date of Exam:   08/13/2023 Medical Rec #: [id number]        Accession #:    [id number] Date of Birth: [birth year]        Patient Gender: F Patient Age:   87 years Exam Location:   [facility] Procedure:      VAS [place] LOWER EXTREMITY VENOUS (DVT) Referring Phys: Almeta Monas COUNTRYMAN --------------------------------------------------------------------------------  Indications: Swelling.  Risk Factors: Immobility past pregnancy. Comparison Study: No significant changes seen since previous exam 06/25/22 Performing Technologist: Shona Simpson  Examination Guidelines: A complete evaluation includes B-mode imaging, spectral Doppler, color Doppler, and power Doppler as needed of all accessible portions of each vessel. Bilateral testing is considered an integral part of a complete examination. Limited examinations for reoccurring indications may be performed as noted. The reflux portion of the exam is performed with the patient in reverse Trendelenburg.  +---------+---------------+---------+-----------+----------+--------------+ RIGHT    CompressibilityPhasicitySpontaneityPropertiesThrombus Aging +---------+---------------+---------+-----------+----------+--------------+ CFV      Full           Yes      Yes                                 +---------+---------------+---------+-----------+----------+--------------+ SFJ      Full                                                        +---------+---------------+---------+-----------+----------+--------------+ FV Prox  Full                                                        +---------+---------------+---------+-----------+----------+--------------+

## 2023-08-15 NOTE — Discharge Instructions (Signed)
Cardiology has transitioned you to torsemide instead of lasix. You have also been started on a new medicine called Jardiance for your heart failure also.   Follow up with cardiology after discharge.

## 2023-08-15 NOTE — Plan of Care (Signed)
  Problem: Skin Integrity: Goal: Skin integrity will improve Outcome: Progressing   Problem: Education: Goal: Knowledge of General Education information will improve Description: Including pain rating scale, medication(s)/side effects and non-pharmacologic comfort measures Outcome: Progressing   Problem: Clinical Measurements: Goal: Ability to maintain clinical measurements within normal limits will improve Outcome: Progressing Goal: Cardiovascular complication will be avoided Outcome: Progressing   Problem: Elimination: Goal: Will not experience complications related to urinary retention Outcome: Progressing   Problem: Pain Management: Goal: General experience of comfort will improve Outcome: Progressing   Problem: Safety: Goal: Ability to remain free from injury will improve Outcome: Progressing

## 2023-08-15 NOTE — Progress Notes (Signed)
Discharge instructions discussed with pt and pt's daughter. Pt A&O, VSS, IV removed. Ready for discharge.

## 2023-08-15 NOTE — Progress Notes (Signed)
Pt d/c'd home before TOC assessment could be completed.

## 2023-08-17 ENCOUNTER — Telehealth: Payer: Self-pay | Admitting: *Deleted

## 2023-08-17 ENCOUNTER — Telehealth: Payer: Self-pay | Admitting: Cardiology

## 2023-08-17 NOTE — Telephone Encounter (Signed)
Pt has appt this week, will discuss then

## 2023-08-17 NOTE — Telephone Encounter (Signed)
Spoke with daughter per DPR and she states yesterday her mom has gained 8 lbd over night. Her leg is starting to swell. Denies any chest pain or SOB. Informed her I will go speak with DOD and give her a call back  Spoke with DOD and he advised to increase torsemide to 40 mg  and weigh daily for the next three days.   Called Luann and she stated she apologize her mom did not gain 8 lbs it was only 2 lbs. Informed her I will speak with Dr. Salena Saner again and give her a call back.  Spoke with DOD again and he stated to let her know she can increase torsemide to 40 mg if her weight increase 5 lbs or more. To weigh daily at the same time in the morning after bathroom use  Spoke with daughter and she is aware of provider recommendations. Sh verbalized understanding. Patient has TOC follow up on Friday with Edd Fabian.

## 2023-08-17 NOTE — Telephone Encounter (Signed)
Pt c/o swelling/edema: STAT if pt has developed SOB within 24 hours  If swelling, where is the swelling located? Swelling in right leg.   How much weight have you gained and in what time span? 8.2 lbs in a day   Have you gained 2 pounds in a day or 5 pounds in a week? Yes   Do you have a log of your daily weights (if so, list)? 10/27 93.5lbs, 10/28 101.7  Are you currently taking a fluid pill? Yes   Are you currently SOB? no  Have you traveled recently in a car or plane for an extended period of time? No  Patient was recently in the hospital (she was just discharged on Saturday 10/26) and daughter states they changed her fluid pill to torsemide (DEMADEX) 20 MG tablet.

## 2023-08-17 NOTE — Transitions of Care (Post Inpatient/ED Visit) (Signed)
08/17/2023  Name: Paula Ward MRN: [id number] DOB: [birth year]  Today's TOC FU Call Status: Today's TOC FU Call Status:: Unsuccessful Call (1st Attempt) Unsuccessful Call (1st Attempt) Date: 08/17/23  Attempted to reach the patient regarding the most recent Inpatient visit;  Received automated outgoing voice message stating that "the person you are trying to call has a voice mail box that is full; please hang up and try your call again later;" unable to leave voice message requesting call back   Follow Up Plan: Additional outreach attempts will be made to reach the patient to complete the Transitions of Care (Post Inpatient visit) call.   Caryl Pina, RN, BSN, Media planner  Transitions of Care  [facility] [phone]: direct office

## 2023-08-18 ENCOUNTER — Telehealth: Payer: Self-pay | Admitting: *Deleted

## 2023-08-18 LAB — SURGICAL PATHOLOGY

## 2023-08-18 NOTE — Telephone Encounter (Signed)
Called [facility] for update on [facility] rx. Rep states that there is no case open but she is going to send email to [facility] for update  Phone: [phone]  Chesley Mires, PharmD, MPH, BCPS, CPP Clinical Pharmacist (Rheumatology and Pulmonology)

## 2023-08-18 NOTE — Progress Notes (Signed)
Cardiology Clinic Note   Patient Name: Paula Ward Date of Encounter: 08/21/2023  Primary Care Provider:  [name], MD Primary Cardiologist:  Olga Millers, MD  Patient Profile    Paula Ward 87 year old female presents the clinic today for follow-up evaluation of her lower extremity edema/chronic diastolic CHF.  Past Medical History    Past Medical History:  Diagnosis Date   Anemia    Angiodysplasia of stomach and duodenum    Arthus phenomenon    AVM (arteriovenous malformation)    Blood transfusion without reported diagnosis    last 4 units 12-22-15, Iron infusion x2 last -01-07-16,01-14-16.   Breast cancer (HCC) 1989   Left   Candida esophagitis (HCC)    Cataract    Chronic kidney disease    Chronic mild renal insuffiency   Corneal edema    Corneal epithelial basement membrane dystrophy    CREST syndrome (HCC)    GERD (gastroesophageal reflux disease)    w/ HH   Interstitial lung disease (HCC)    Nodule of kidney    Pericardial effusion    PONV (postoperative nausea and vomiting)    Pulmonary embolus (HCC) 2003   Pulmonary hypertension (HCC)    followed by Dr Bland Span at [facility], now Dr. Penni Bombard visit end 2'17.   Rectal incontinence    Renal cell carcinoma (HCC)    Scleroderma (HCC)    Tubular adenoma of colon    Uterine polyp    Past Surgical History:  Procedure Laterality Date   APPENDECTOMY  1962   BREAST LUMPECTOMY  1989   left   CATARACT EXTRACTION, BILATERAL Bilateral 12/2013   COLONOSCOPY WITH PROPOFOL N/A 04/15/2021   Procedure: COLONOSCOPY WITH PROPOFOL;  Surgeon: Rachael Fee, MD;  Location: [facility];  Service: Endoscopy;  Laterality: N/A;   ENTEROSCOPY N/A 01/18/2016   Procedure: ENTEROSCOPY;  Surgeon: Napoleon Form, MD;  Location: [facility];  Service: Endoscopy;  Laterality: N/A;   ENTEROSCOPY N/A 05/24/2018   Procedure: ENTEROSCOPY;  Surgeon: Lynann Bologna, MD;  Location: [facility];  Service: Endoscopy;   Laterality: N/A;   ENTEROSCOPY N/A 03/29/2021   Procedure: ENTEROSCOPY;  Surgeon: Beverley Fiedler, MD;  Location: [facility];  Service: Gastroenterology;  Laterality: N/A;   ENTEROSCOPY N/A 04/13/2021   Procedure: ENTEROSCOPY;  Surgeon: Benancio Deeds, MD;  Location: [facility];  Service: Gastroenterology;  Laterality: N/A;   ESOPHAGOGASTRODUODENOSCOPY (EGD) WITH PROPOFOL N/A 12/21/2015   Procedure: ESOPHAGOGASTRODUODENOSCOPY (EGD) WITH PROPOFOL;  Surgeon: Hilarie Fredrickson, MD;  Location: [facility];  Service: Endoscopy;  Laterality: N/A;   HOT HEMOSTASIS N/A 05/24/2018   Procedure: HOT HEMOSTASIS (ARGON PLASMA COAGULATION/BICAP);  Surgeon: Lynann Bologna, MD;  Location: [facility];  Service: Endoscopy;  Laterality: N/A;   HOT HEMOSTASIS N/A 03/29/2021   Procedure: HOT HEMOSTASIS (ARGON PLASMA COAGULATION/BICAP);  Surgeon: Beverley Fiedler, MD;  Location: [facility];  Service: Gastroenterology;  Laterality: N/A;   HOT HEMOSTASIS N/A 04/13/2021   Procedure: HOT HEMOSTASIS (ARGON PLASMA COAGULATION/BICAP);  Surgeon: Benancio Deeds, MD;  Location: [facility];  Service: Gastroenterology;  Laterality: N/A;   INTRAMEDULLARY (IM) NAIL INTERTROCHANTERIC Left 10/11/2022   Procedure: INTRAMEDULLARY (IM) NAIL INTERTROCHANTERIC;  Surgeon: Ollen Gross, MD;  Location: [facility];  Service: Orthopedics;  Laterality: Left;   IR GENERIC HISTORICAL  06/05/2016   IR RADIOLOGIST EVAL & MGMT 06/05/2016 [name], MD [facility] INTERV RAD   IVC Filter     KIDNEY SURGERY     left -"laser  surgery by Dr. Fredia Sorrow- 5 yrs ago" no removal   RIGHT/LEFT HEART CATH AND CORONARY ANGIOGRAPHY N/A 04/25/2022   Procedure: RIGHT/LEFT HEART CATH AND CORONARY ANGIOGRAPHY;  Surgeon: Tonny Bollman, MD;  Location: [facility] INVASIVE CV LAB;  Service: Cardiovascular;  Laterality: N/A;   SCHLEROTHERAPY  05/24/2018   Procedure: Theresia Majors;  Surgeon: Lynann Bologna, MD;  Location: [facility];  Service: Endoscopy;;   SUBMUCOSAL TATTOO  INJECTION  04/13/2021   Procedure: SUBMUCOSAL TATTOO INJECTION;  Surgeon: Benancio Deeds, MD;  Location: [facility];  Service: Gastroenterology;;   TONSILLECTOMY     TUBAL LIGATION      Allergies  Allergies  Allergen Reactions   Codeine Nausea Only    Hallucinations, too   Other Nausea And Vomiting and Other (See Comments)    "-mycin" antibiotics.   Also cause hallucinations.   Erythromycin Nausea And Vomiting   Gluten Meal Diarrhea   Iodinated Contrast Media Other (See Comments)    Renal issues   Lisinopril     Pt doesn't remember reaction   Mirtazapine Other (See Comments)    "Threw me into orbit"   Mycophenolate Mofetil Nausea Only   Sulfa Antibiotics Other (See Comments)    Unknown reaction    History of Present Illness    Paula Ward 87 year old female has a PMH of essential hypertension, pulmonary hypertension, pericardial effusion, chronic diastolic CHF, AVM, moderate aortic stenosis, orthostatic hypotension, interstitial lung disease, GERD, CKD stage III, PE, incontinence, scleroderma, anemia, generalized weakness, vitamin B12 deficiency, and macrocytic anemia.  Her PMH also includes gastrointestinal bleeding secondary to AVM malformations which required IVC filter placement and breast CA.  She underwent cardiac catheterization 7/23 which showed no obstructive coronary disease, pulmonary hypertension, moderate aortic stenosis with a mean gradient of 16 mmHg.  She was seen in the emergency room on 06/04/2023 due to increased shortness of breath, chest tightness, and lower extremity edema.  Her oxygen saturation was noted to be 80% on room air.  Her BNP was 2680.  Her chest x-ray showed stable cardiomegaly and pericardial effusion with no pulmonary edema or pulmonary effusion.  Echocardiogram during that time showed LVEF of 70-75%, moderate concentric LVH, mildly reduced RV function and severely dilated LA.  Large pericardial effusion was noted.  This was slightly  increased from June.  There was no evidence of tamponade or severe MAC and moderate AS was noted.  She received IV diuresis and was discharged in stable condition 06/06/2023.  She was seen in follow-up by Reather Littler NP on 06/19/2023.  She reported that she was doing well from a cardiac standpoint.  She denied chest pain and felt her breathing had returned to baseline.  She had been monitoring her weights at home which were stable.  She was having regular lab work at the cancer center and had planned lab work on 06/30/2023.  She contacted the nurse triage line 08/04/2023 and reported that she had increased lower extremity swelling.  She reported a 10 pound weight gain since 8/15 and another 2 pound weight gain since 9/25.  She was taking furosemide 40 mg once daily.  She continued to use lower extremity support stockings.  She was instructed to increase her furosemide to 40 mg twice daily.  She was added to my schedule.  She presented to the clinic 08/06/23  for follow-up evaluation and stated she noticed a 6 pound weight gain overnight.  She reported that she was around 100 pounds earlier in the week.  Today in the  office her weight is 108.2 pounds.  She reported that she was not able to take 40 mg of Lasix 2 times per day so she has been taking 60 mg of Lasix for the past 2 days.  Her right leg is greater than her left.  She has noted to have +1-2 pitting edema to her knee her right leg and generalized nonpitting edema on her left leg.  I will continued her 60 mg of Lasix x 4 days and give her 20 mill equivalents of potassium daily. I planned repeat  BMP 1 week and planned follow-up in 1 week.   She had follow-up appointment with pulmonology that week and was stable using 3 L of oxygen nasal cannula while active.  She presented to the emergency department on 08/13/2023.  She was diagnosed with right lower extremity cellulitis.  She received IV antibiotics and diuresis.  Her furosemide was transitioned to  torsemide.  Her echocardiogram showed known severe LVH, G1 DD, large pericardial effusion without tamponade.  Her lower extremity ultrasound was negative for DVT.  There was low suspicion for true cellulitis however her Keflex was continued at discharge.  She presents to the clinic today for follow-up evaluation and states she has not had good urine output with torsemide.  She has been monitoring her weight at home.  She reports that when she left the hospital her weight was 93-1/2 pounds.  It has steadily increased up to 106 pounds today.  Our clinic scale shows 108 pounds.  She has been weighing after voiding in the morning with the same shoes on.  She presents with her son who asks several questions about her fluid retention and pulmonary hypertension.  All questions were answered.  I will have her continue daily weights, stop torsemide and resume furosemide 60 mg daily, order BMP, keep follow-up for November 14, and have her follow-up with nephrology as scheduled.  Today she denies chest pain, lower extremity edema, fatigue, palpitations, melena, hematuria, hemoptysis, diaphoresis, weakness, presyncope, syncope, orthopnea, and PND.   Home Medications    Prior to Admission medications   Medication Sig Start Date End Date Taking? Authorizing Provider  acetaminophen (TYLENOL) 325 MG tablet Take 2 tablets (650 mg total) by mouth every 6 (six) hours as needed for moderate pain. 07/18/22   Wallis Bamberg, PA-C  albuterol (VENTOLIN HFA) 108 (90 Base) MCG/ACT inhaler INHALE 2 PUFFS INTO THE LUNGS EVERY 6 HOURS AS NEEDED FOR WHEEZING OR SHORTNESS OF BREATH 03/13/23   [name], MD  augmented betamethasone dipropionate (DIPROLENE-AF) 0.05 % cream Apply topically 2 (two) times daily as needed. 07/13/23   [provider]  clobetasol ointment (TEMOVATE) 0.05 % Apply 1 Application topically 2 (two) times daily. 04/20/23   [provider]  Cyanocobalamin (VITAMIN B-12) 1000 MCG SUBL Place  1,000 mcg under the tongue. Taking twice a week    [provider]  docusate sodium (COLACE) 100 MG capsule Take 1 capsule (100 mg total) by mouth 2 (two) times daily. Patient taking differently: Take 100 mg by mouth 2 (two) times daily. Pt takes every morning 10/15/22   Edmisten, Kristie L, PA  famotidine (PEPCID) 20 MG tablet Take 1 tablet (20 mg total) by mouth at bedtime. 02/10/23   Napoleon Form, MD  fluticasone (FLONASE) 50 MCG/ACT nasal spray Place 1 spray into both nostrils daily as needed for allergies. Patient taking differently: Place 1 spray into both nostrils in the morning and at bedtime. 05/15/22   Hillard Danker  A, MD  furosemide (LASIX) 20 MG tablet Take 2 tablets (40 mg total) by mouth daily. 06/19/23   Reather Littler D, NP  guaiFENesin (MUCINEX) 600 MG 12 hr tablet Take 300 mg by mouth daily.    [provider]  ipratropium (ATROVENT) 0.03 % nasal spray Place 2 sprays into both nostrils every 12 (twelve) hours. 05/12/22   Martina Sinner, MD  latanoprost (XALATAN) 0.005 % ophthalmic solution Place 1 drop into both eyes at bedtime. 09/23/22   [provider]  magic mouthwash SOLN Take 10 mLs by mouth 4 (four) times daily as needed for mouth pain. 03/22/22   Burnadette Pop, MD  mirtazapine (REMERON) 15 MG tablet Take 1 tablet (15 mg total) by mouth at bedtime. 03/13/23   [name], MD  montelukast (SINGULAIR) 10 MG tablet TAKE 1 TABLET BY MOUTH AT BEDTIME 07/31/23   [name], MD  Multiple Vitamin (MULTIVITAMIN) capsule Take 1 capsule by mouth daily.    [provider]  mupirocin ointment (BACTROBAN) 2 % Apply 1 Application topically 2 (two) times daily as needed (dry skin).    [provider]  OPSUMIT 10 MG tablet Take 1 tablet (10 mg total) by mouth daily. 07/07/23   Parrett, Virgel Bouquet, NP  OXYGEN Inhale into the lungs. 2 LMP at night and upon exertion    [provider]  pantoprazole (PROTONIX) 40 MG  tablet Take 1 tablet (40 mg total) by mouth daily. 02/10/23   Napoleon Form, MD  polyethylene glycol (MIRALAX / GLYCOLAX) 17 g packet Take 17 g by mouth daily. 10/15/22   Edmisten, Lyn Hollingshead, PA  spironolactone (ALDACTONE) 25 MG tablet Take 1 tablet (25 mg total) by mouth daily. 08/03/23   Gwenlyn Fudge, FNP  sucralfate (CARAFATE) 1 GM/10ML suspension TAKE 10 MLS BY MOUTH EVERY 6 HOURS AS NEEDED FOR REFLUX 02/11/23   Napoleon Form, MD  triamcinolone cream (KENALOG) 0.1 % Apply 1 Application topically 2 (two) times daily. 07/07/23   [provider]    Family History    Family History  Problem Relation Age of Onset   Bladder Cancer Father    Diabetes Father    Prostate cancer Father    Alzheimer's disease Mother    Diabetes Sister    Lung cancer Sister         smoker   Esophageal cancer Paternal Uncle    Colon cancer Neg Hx    She indicated that her mother is deceased. She indicated that her father is deceased. She indicated that the status of her sister is unknown. She indicated that the status of her paternal uncle is unknown. She indicated that the status of her neg hx is unknown.  Social History    Social History   Socioeconomic History   Marital status: Married    Spouse name: Not on file   Number of children: 2   Years of education: Not on file   Highest education level: Not on file  Occupational History   Occupation: Retired  Tobacco Use   Smoking status: Never   Smokeless tobacco: Never  Vaping Use   Vaping status: Never Used  Substance and Sexual Activity   Alcohol use: No   Drug use: No   Sexual activity: Not Currently  Other Topics Concern   Not on file  Social History Narrative   Married '611 son- '65, 1 daughter '63; 6 children (2 adopted)SO- SOBRetirement- doing well. Marriage in good health. Patient  has never smoked. Alcohol use- noPt gets regular exercise   Social Determinants of Health   Financial Resource Strain: Low Risk   (04/02/2022)   Overall Financial Resource Strain (CARDIA)    Difficulty of Paying Living Expenses: Not hard at all  Food Insecurity: No Food Insecurity (08/18/2023)   Hunger Vital Sign    Worried About Running Out of Food in the Last Year: Never true    Ran Out of Food in the Last Year: Never true  Transportation Needs: No Transportation Needs (08/18/2023)   PRAPARE - Administrator, Civil Service (Medical): No    Lack of Transportation (Non-Medical): No  Physical Activity: Inactive (04/02/2022)   Exercise Vital Sign    Days of Exercise per Week: 0 days    Minutes of Exercise per Session: 0 min  Stress: No Stress Concern Present (04/02/2022)   [employer] of Occupational Health - Occupational Stress Questionnaire    Feeling of Stress : Not at all  Social Connections: Socially Integrated (04/02/2022)   Social Connection and Isolation Panel [NHANES]    Frequency of Communication with Friends and Family: More than three times a week    Frequency of Social Gatherings with Friends and Family: More than three times a week    Attends Religious Services: More than 4 times per year    Active Member of [employer] or Organizations: Yes    Attends Engineer, structural: More than 4 times per year    Marital Status: Married  Catering manager Violence: Not At Risk (08/14/2023)   Humiliation, Afraid, Rape, and Kick questionnaire    Fear of Current or Ex-Partner: No    Emotionally Abused: No    Physically Abused: No    Sexually Abused: No     Review of Systems    General:  No chills, fever, night sweats or weight changes.  Cardiovascular:  No chest pain, dyspnea on exertion, edema, orthopnea, palpitations, paroxysmal nocturnal dyspnea. Dermatological: No rash, lesions/masses Respiratory: No cough, dyspnea Urologic: No hematuria, dysuria Abdominal:   No nausea, vomiting, diarrhea, bright red blood per rectum, melena, or hematemesis Neurologic:  No visual changes, wkns,  changes in mental status. All other systems reviewed and are otherwise negative except as noted above.  Physical Exam    VS:  BP (!) 128/50 (BP Location: Left Arm, Patient Position: Sitting, Cuff Size: Normal)   Pulse 79   Ht 5\' 3"  (1.6 m)   Wt 108 lb (49 kg)   SpO2 98%   BMI 19.13 kg/m  , BMI Body mass index is 19.13 kg/m. GEN: Well nourished, well developed, in no acute distress. HEENT: normal. Neck: Supple, no JVD, carotid bruits, or masses. Cardiac: RRR, 3/6 systolicmurmur heard along right sternal border., rubs, or gallops. No clubbing, cyanosis, right greater than left pitting 1+ edema.  Radials/DP/PT 2+ and equal bilaterally.  Respiratory:  Respirations regular and unlabored, crackles bilateral bases . GI: Soft, nontender, nondistended, BS + x 4. MS: no deformity or atrophy. Skin: warm and dry, no rash. Neuro:  Strength and sensation are intact. Psych: Normal affect.  Accessory Clinical Findings    Recent Labs: 06/03/2023: Pro B Natriuretic peptide (BNP) 2,680.0 08/14/2023: ALT 12; B Natriuretic Peptide 2,468.4 08/15/2023: BUN 45; Creatinine, Ser 1.37; Hemoglobin 9.9; Magnesium 2.2; Platelets 177; Potassium 4.9; Sodium 136   Recent Lipid Panel    Component Value Date/Time   CHOL 158 06/24/2018 1004   TRIG 112.0 06/24/2018 1004   HDL 57.50 06/24/2018 1004  CHOLHDL 3 06/24/2018 1004   VLDL 22.4 06/24/2018 1004   LDLCALC 78 06/24/2018 1004         ECG personally reviewed by me today-  none today.     Echocardiogram 06/05/2023  IMPRESSIONS     1. Left ventricular ejection fraction, by estimation, is 70 to 75%. The  left ventricle has hyperdynamic function. The left ventricle has no  regional wall motion abnormalities. There is moderate concentric left  ventricular hypertrophy. Indeterminate  diastolic filling due to E-A fusion.   2. Right ventricular systolic function is mildly reduced. The right  ventricular size is normal.   3. Left atrial size was  severely dilated.   4. Large pericardial effusion surrounds heart, mainly posteriorly and  lateral to LV. [place]axunak dimension is 5.6 cm posteriorly. It has increased  in size from echo done in June 2024. Does not meet criteria for tamponade  by echo criteria. Clinical  correlation indicated .Marland Kitchen Large pericardial effusion.   5. MV annulus is severely calcified. MV is thickened with restricted  motion. Difficult to see well Mean gradient through the MV is 8 mm Hg. MVA  (Pt1/2 ) 3 cm2. Compared to echo report from June 2024, mean gradient is  incresaed (5 to 8 mm Hg) Consider  TEE to evaluate further.. Trivial mitral valve regurgitation.   6. AV is thickened, calcified with restricted motion. Peak and mean  gradients through the valve are 59 and 37 mm Hg respectively.  Dimensionless index is 0.33 consistent with moderate AS.Marland Kitchen The aortic valve  is abnormal. Aortic valve regurgitation is not  visualized.   7. The inferior vena cava is normal in size with <50% respiratory  variability, suggesting right atrial pressure of 8 mmHg.   FINDINGS   Left Ventricle: Left ventricular ejection fraction, by estimation, is 70  to 75%. The left ventricle has hyperdynamic function. The left ventricle  has no regional wall motion abnormalities. The left ventricular internal  cavity size was small. There is  moderate concentric left ventricular hypertrophy. Indeterminate diastolic  filling due to E-A fusion.   Right Ventricle: The right ventricular size is normal. Right vetricular  wall thickness was not assessed. Right ventricular systolic function is  mildly reduced.   Left Atrium: Left atrial size was severely dilated.   Right Atrium: Right atrial size was normal in size.   Pericardium: Large pericardial effusion surrounds heart, mainly  posteriorly and lateral to LV. Naxunak dimension is 5.6 cm posteriorly. It  has increased in size from echo done in June 2024. Does not meet criteria  for tamponade  by echo criteria. Clinical  correlation indicated. A large pericardial effusion is present.   Mitral Valve: MV annulus is severely calcified. MV is thickened with  restricted motion. Difficult to see well Mean gradient through the MV is 8  mm Hg. MVA (Pt1/2 ) 3 cm2. Compared to echo report from June 2024, mean  gradient is incresaed (5 to 8 mm Hg)  Consider TEE to evaluate further. Trivial mitral valve regurgitation. MV  peak gradient, 13.2 mmHg. The mean mitral valve gradient is 8.0 mmHg.   Tricuspid Valve: The tricuspid valve is normal in structure. Tricuspid  valve regurgitation is mild.   Aortic Valve: AV is thickened, calcified with restricted motion. Peak and  mean gradients through the valve are 59 and 37 mm Hg respectively.  Dimensionless index is 0.33 consistent with moderate AS. The aortic valve  is abnormal. Aortic valve regurgitation   is not visualized.  Aortic valve mean gradient measures 37.0 mmHg. Aortic  valve peak gradient measures 58.7 mmHg. Aortic valve area, by VTI measures  0.95 cm.   Pulmonic Valve: The pulmonic valve was not well visualized. Pulmonic valve  regurgitation is mild.   Aorta: The aortic root and ascending aorta are structurally normal, with  no evidence of dilitation.   Venous: The inferior vena cava is normal in size with less than 50%  respiratory variability, suggesting right atrial pressure of 8 mmHg.   IAS/Shunts: No atrial level shunt detected by color flow Doppler.   Echocardiogram 08/15/2023 IMPRESSIONS     1. Left ventricular ejection fraction, by estimation, is >75%. The left  ventricle has hyperdynamic function. The left ventricle has no regional  wall motion abnormalities. There is severe left ventricular hypertrophy.  Left ventricular diastolic  parameters are consistent with Grade I diastolic dysfunction (impaired  relaxation).   2. Right ventricular systolic function is normal. The right ventricular  size is normal.   3.  Left atrial size was severely dilated.   4. Large pericardial effusion. The pericardial effusion is  circumferential. There is no evidence of cardiac tamponade.   5. The mitral valve is normal in structure. No evidence of mitral valve  regurgitation. No evidence of mitral stenosis. Severe mitral annular  calcification.   6. The aortic valve is normal in structure. There is severe calcifcation  of the aortic valve. There is moderate thickening of the aortic valve.  Aortic valve regurgitation is not visualized. Mild aortic valve stenosis.  Aortic valve mean gradient measures   14.8 mmHg. Aortic valve Vmax measures 2.59 m/s.   7. The inferior vena cava is normal in size with greater than 50%  respiratory variability, suggesting right atrial pressure of 3 mmHg.   Comparison(s): Large pericardial effusion noted on prior ECHO, no major  change.   Conclusion(s)/Recommendation(s): Findings consistent with hypertrophic  cardiomyopathy.   FINDINGS   Left Ventricle: Left ventricular ejection fraction, by estimation, is  >75%. The left ventricle has hyperdynamic function. The left ventricle has  no regional wall motion abnormalities. The left ventricular internal  cavity size was normal in size. There  is severe left ventricular hypertrophy. Left ventricular diastolic  parameters are consistent with Grade I diastolic dysfunction (impaired  relaxation).   Right Ventricle: The right ventricular size is normal. No increase in  right ventricular wall thickness. Right ventricular systolic function is  normal.   Left Atrium: Left atrial size was severely dilated.   Right Atrium: Right atrial size was normal in size.   Pericardium: A large pericardial effusion is present. The pericardial  effusion is circumferential. There is no evidence of cardiac tamponade.   Mitral Valve: The mitral valve is normal in structure. Severe mitral  annular calcification. No evidence of mitral valve regurgitation.  No  evidence of mitral valve stenosis.   Tricuspid Valve: The tricuspid valve is normal in structure. Tricuspid  valve regurgitation is mild . No evidence of tricuspid stenosis.   Aortic Valve: The aortic valve is normal in structure. There is severe  calcifcation of the aortic valve. There is moderate thickening of the  aortic valve. Aortic valve regurgitation is not visualized. Mild aortic  stenosis is present. Aortic valve mean  gradient measures 14.8 mmHg. Aortic valve peak gradient measures 26.9  mmHg. Aortic valve area, by VTI measures 1.12 cm.   Pulmonic Valve: The pulmonic valve was normal in structure. Pulmonic valve  regurgitation is not visualized. No evidence of pulmonic  stenosis.   Aorta: The aortic root is normal in size and structure.   Venous: The inferior vena cava is normal in size with greater than 50%  respiratory variability, suggesting right atrial pressure of 3 mmHg.   IAS/Shunts: No atrial level shunt detected by color flow Doppler.     Cardiac catheterization 04/25/2022  1.  Ectatic but patent coronary arteries with no obstructive disease.  Right dominant coronary artery. 2.  Mild pulmonary hypertension with PA pressure 52/15, mean 28 mmHg, transpulmonary gradient 10 mmHg, PVR approximately 2 Wood units. 3.  Moderate aortic stenosis with mean transvalvular gradient 16 mmHg calculated aortic valve area 1.16 cm 4.  Heavy mitral annular calcification by plain fluoroscopy 5.  Mild aortic valve calcification by plain fluoroscopy   Recommend: Continued medical therapy, clinical follow-up, echo surveillance.  The patient does not appear to have severe aortic stenosis and I do not think she would benefit from TAVR at this time.   Diagnostic Dominance: Right  Intervention   Assessment & Plan   1.  Chronic diastolic CHF-lower extremity 1+ pitting edema right greater than left.  Diagnosed with lower extremity cellulitis and acute on chronic diastolic CHF  during admission 08/14/2023 through 08/15/2023.  Not urinating on torsemide.  Will discontinue torsemide and start Lasix 60 mg daily Continue  Jardiance, spironolactone Start Lasix 60 mg daily Heart healthy low-sodium diet Continue lower extremity support stockings Continue daily weights BMP today  Pulmonary hypertension-breathing at baseline.  Follows with [facility] pulmonary hypertension clinic. Using 3Lof O2 in clinic today.  Continue current medical therapy  Essential hypertension-BP today 128/50 Maintain blood pressure log Continue current medical therapy Heart healthy low-sodium diet  Aortic stenosis-continues to use 2-3 L of oxygen via nasal cannula.  Echocardiogram 08/15/2023 showed severe calcification of the aortic valve with mild aortic valve stenosis.  Echocardiogram 06/05/2023 EF 70-75%, intermediate diastolic parameters, left atria severely dilated, aortic valve with peak and mean gradient of 59 and 37 mmHg, moderate AAS with no regurgitation. Plan for repeat echocardiogram in 6 months.  CKD stage III-creatinine 1.37 on 08/15/23.  In the setting of hospitalization and diuresis.  Avoid nephrotoxic agents.  Has an appointment with nephrology in the near future. Follows with PCP  Disposition: Follow-up with Dr. Jens Som or me in 2-3 month.   Thomasene Ripple. Lyndel Sarate NP-C     08/21/2023, 2:54 PM [facility] [address] Office [phone] Fax [phone]    I spent 14 minutes examining this patient, reviewing medications, and using patient centered shared decision making involving her cardiac care.   I spent greater than 20 minutes reviewing her past medical history,  medications, and prior cardiac tests.

## 2023-08-18 NOTE — Transitions of Care (Post Inpatient/ED Visit) (Signed)
08/18/2023  Name: Paula Ward MRN: [id number] DOB: [birth year]  Today's TOC FU Call Status: Today's TOC FU Call Status:: Successful TOC FU Call Completed TOC FU Call Complete Date: 08/18/23 Patient's Name and Date of Birth confirmed.  Transition Care Management Follow-up Telephone Call Date of Discharge: 08/15/23 Discharge Facility: [facility]) Type of Discharge: Inpatient Admission Primary Inpatient Discharge Diagnosis:: (R) LE swelling; cellulitis How have you been since you were released from the hospital?: Same ("They changes my fluid pill and I think it's taking awhile for my body to acclimate to the new medicine; I am going to the cardiologist on Friday; I don't need to schedule with Dr. Okey Dupre since I am going to the heart doctor") Any questions or concerns?: No  Items Reviewed: Did you receive and understand the discharge instructions provided?: Yes (thoroughly reviewed with patient who verbalizes good understanding of same) Medications obtained,verified, and reconciled?: Yes (Medications Reviewed) (Full medication reconciliation/ review completed; no concerns or discrepancies identified; confirmed patient obtained/ is taking all newly Rx'd medications as instructed; self-manages medications and denies questions/ concerns around medications today) Any new allergies since your discharge?: No Dietary orders reviewed?: Yes Type of Diet Ordered:: Heart Healthy, low salt Do you have support at home?: Yes People in Home: spouse Name of Support/Comfort Primary Source: Reports essentially independent in self-care activities at baseline; supportive spouse and family/ friends assists as/ if needed/ indicated  Medications Reviewed Today: Medications Reviewed Today     Reviewed by Michaela Corner, RN (Registered Nurse) on 08/18/23 at 1232  Med List Status: <None>   Medication Order Taking? Sig Documenting Provider Last Dose Status Informant  acetaminophen (TYLENOL) 325 MG  tablet [id number] Yes Take 2 tablets (650 mg total) by mouth every 6 (six) hours as needed for moderate pain. Wallis Bamberg, PA-C Taking Active Self  albuterol (VENTOLIN HFA) 108 (90 Base) MCG/ACT inhaler [id number] Yes INHALE 2 PUFFS INTO THE LUNGS EVERY 6 HOURS AS NEEDED FOR WHEEZING OR SHORTNESS OF BREATH  Patient taking differently: Inhale 2 puffs into the lungs every 6 (six) hours as needed for wheezing or shortness of breath.   [name], MD Taking Active Self  augmented betamethasone dipropionate (DIPROLENE-AF) 0.05 % cream [id number] Yes Apply 1 application  topically 2 (two) times daily as needed (for irritation). [provider] Taking Active Self  cephALEXin (KEFLEX) 250 MG capsule [id number] Yes Take 1 capsule (250 mg total) by mouth 2 (two) times daily for 5 days. Lewie Chamber, MD Taking Active   clobetasol ointment (TEMOVATE) 0.05 % [id number] Yes Apply 1 Application topically 2 (two) times daily as needed (for irritation). [provider] Taking Active Self           Med Note Antony Madura, Harlin Rain Aug 13, 2023 10:12 PM)    docusate sodium (COLACE) 100 MG capsule [id number] Yes Take 1 capsule (100 mg total) by mouth 2 (two) times daily.  Patient taking differently: Take 100 mg by mouth in the morning.   Edmisten, Lyn Hollingshead, PA Taking Active Self  empagliflozin (JARDIANCE) 10 MG TABS tablet [id number] Yes Take 1 tablet (10 mg total) by mouth daily. Lewie Chamber, MD Taking Active   famotidine (PEPCID) 20 MG tablet [id number] Yes Take 1 tablet (20 mg total) by mouth at bedtime. Napoleon Form, MD Taking Active Self           Med Note Ventura Bruns Aug 13, 2023 10:13 PM)  fluticasone (FLONASE) 50 MCG/ACT nasal spray [id number] Yes USE 1 SPRAY(S) IN EACH NOSTRIL ONCE DAILY AS NEEDED FOR ALLERGIES  Patient taking differently: Place 1 spray into both nostrils in the morning.   [name], MD Taking Active Self  guaiFENesin (MUCINEX) 600 MG 12  hr tablet [id number] Yes Take 300 mg by mouth daily. [provider] Taking Active Self  ipratropium (ATROVENT) 0.03 % nasal spray [id number] Yes Place 2 sprays into both nostrils every 12 (twelve) hours.  Patient taking differently: Place 2 sprays into both nostrils at bedtime.   Martina Sinner, MD Taking Active Self  latanoprost (XALATAN) 0.005 % ophthalmic solution [id number] Yes Place 1 drop into both eyes at bedtime. [provider] Taking Active Self  magic mouthwash SOLN [id number] Yes Take 10 mLs by mouth 4 (four) times daily as needed for mouth pain. Burnadette Pop, MD Taking Active Self  mirtazapine (REMERON) 15 MG tablet [id number] No Take 1 tablet (15 mg total) by mouth at bedtime.  Patient not taking: Reported on 08/13/2023   [name], MD Not Taking Active Self  montelukast (SINGULAIR) 10 MG tablet [id number] Yes TAKE 1 TABLET BY MOUTH AT BEDTIME  Patient taking differently: Take 10 mg by mouth at bedtime. TAKE 1 TABLET BY MOUTH AT BEDTIME   [name], MD Taking Active Self  mupirocin ointment (BACTROBAN) 2 % [id number] Yes Apply 1 Application topically 2 (two) times daily as needed (for irritation). [provider] Taking Active Self  Nutritional Supplements (ENSURE HIGH PROTEIN) LIQD [id number] Yes Take 0.5 Bottles by mouth daily as needed (for supplementation). [provider] Taking Active Self  OPSUMIT 10 MG tablet [id number] Yes Take 1 tablet (10 mg total) by mouth daily. Julio Sicks, NP Taking Active Self  OXYGEN [id number] Yes Inhale 2-3 L/min into the lungs continuous. [provider] Taking Active Self  pantoprazole (PROTONIX) 40 MG tablet [id number] Yes Take 1 tablet (40 mg total) by mouth daily.  Patient taking differently: Take 40 mg by mouth daily as needed (for acid reflux).   Napoleon Form, MD Taking Active Self  polyethylene glycol (MIRALAX / GLYCOLAX) 17 g packet [id number] Yes Take 17 g by  mouth daily. Derenda Fennel, [place] Taking Active Self           Med Note Michaela Corner   Tue Aug 18, 2023 12:20 PM) 08/18/23- reports during [facility] call has not needed recently  spironolactone (ALDACTONE) 25 MG tablet [id number] Yes Take 0.5 tablets (12.5 mg total) by mouth daily. Lewie Chamber, MD Taking Active   sucralfate (CARAFATE) 1 GM/10ML suspension [id number] Yes TAKE 10 MLS BY MOUTH EVERY 6 HOURS AS NEEDED FOR REFLUX  Patient taking differently: Take 1 g by mouth every 6 (six) hours as needed (to coat the stomach).   Napoleon Form, MD Taking Active Self  torsemide (DEMADEX) 20 MG tablet [id number] Yes Take 1 tablet (20 mg total) by mouth daily. Lewie Chamber, MD Taking Active   triamcinolone cream (KENALOG) 0.1 % [id number] Yes Apply 1 Application topically See admin instructions. Apply to affected leg(s) after showers and up to 2 additional times a day as needed for irritation [provider] Taking Active Self            Home Care and Equipment/Supplies: Were Home Health Services Ordered?: No Any new equipment or medical supplies ordered?: No  Functional Questionnaire: Do you need assistance with bathing/showering or dressing?: No Do you need assistance with meal  preparation?: No (husband assists as indicated) Do you need assistance with eating?: No Do you have difficulty maintaining continence: No Do you need assistance with getting out of bed/getting out of a chair/moving?: No Do you have difficulty managing or taking your medications?: No  Follow up appointments reviewed: PCP Follow-up appointment confirmed?: NA (verified not indicated per hospital discharging provider discharge notes-- patient declined scheduling with PCP during [facility] call today) Specialist Hospital Follow-up appointment confirmed?: Yes Date of Specialist follow-up appointment?: 08/21/23 Follow-Up Specialty Provider:: cardiology provider Do you need transportation to your follow-up  appointment?: No Do you understand care options if your condition(s) worsen?: Yes-patient verbalized understanding  SDOH Interventions Today    Flowsheet Row Most Recent Value  SDOH Interventions   Food Insecurity Interventions Intervention Not Indicated  Transportation Interventions Intervention Not Indicated  [drives self at baseline,  family/ friends assisting post-recent hospital discharge]      TOC Interventions Today    Flowsheet Row Most Recent Value  TOC Interventions   TOC Interventions Discussed/Reviewed TOC Interventions Discussed  [Patient declines need for ongoing/ further care management outreach,  declines enrollment in 30-day TOC program,  provided my direct contact information should questions/ concerns/ needs arise post-TOC call]      Interventions Today    Flowsheet Row Most Recent Value  Chronic Disease   Chronic disease during today's visit Congestive Heart Failure (CHF)  General Interventions   General Interventions Discussed/Reviewed General Interventions Discussed, Doctor Visits, Durable Medical Equipment (DME)  Doctor Visits Discussed/Reviewed Doctor Visits Discussed, PCP, Specialist  Durable Medical Equipment (DME) Other  [confirmed uses assistive devices on regular basis, at baseline --cane- prn]  PCP/Specialist Visits Compliance with follow-up visit  Education Interventions   Education Provided Provided Education  Provided Verbal Education On Other  [Reinforced rationale for daily weight monitoring at home along with weight gain guidelines/ action plan for weight gain,  importance of taking diuretic as prescribed, safe use/ maintenance of home O2, heart healthy low salt diet]  Nutrition Interventions   Nutrition Discussed/Reviewed Nutrition Discussed  Pharmacy Interventions   Pharmacy Dicussed/Reviewed Pharmacy Topics Discussed  [Full medication review with updating medication list in EHR per patient report]  Safety Interventions   Safety  Discussed/Reviewed Safety Discussed, Fall Risk      Caryl Pina, RN, BSN, Media planner  Transitions of Care  [facility] Population [facility]  [place] [phone]: direct office

## 2023-08-19 ENCOUNTER — Encounter: Payer: Self-pay | Admitting: Cardiology

## 2023-08-19 LAB — CULTURE, BLOOD (ROUTINE X 2)
Culture: NO GROWTH
Culture: NO GROWTH
Special Requests: ADEQUATE
Special Requests: ADEQUATE

## 2023-08-19 NOTE — Telephone Encounter (Signed)
Error

## 2023-08-19 NOTE — Telephone Encounter (Signed)
Patient is following up because she states she was advised to call in if she gained over 2 lbs and she has. Patient reports yesterday she weighed 102.5 lbs and today she weighs 104.1 lbs. She mentions that she cannot wear compression stockings because when she gets hot they itch. Patient also states that she doesn't urinate as frequently as she assumes she should given her fluid intake + being on Lasix.

## 2023-08-19 NOTE — Telephone Encounter (Signed)
Spoke with patient and she is aware to only take extra dose of torsemide if she gains 5 lbs.  She verbalized understanding

## 2023-08-21 ENCOUNTER — Other Ambulatory Visit: Payer: Self-pay | Admitting: Pulmonary Disease

## 2023-08-21 ENCOUNTER — Ambulatory Visit: Payer: Medicare PPO | Attending: General Practice | Admitting: General Practice

## 2023-08-21 ENCOUNTER — Telehealth: Payer: Self-pay

## 2023-08-21 ENCOUNTER — Encounter: Payer: Self-pay | Admitting: General Practice

## 2023-08-21 VITALS — BP 128/50 | HR 79 | Ht 63.0 in | Wt 108.0 lb

## 2023-08-21 DIAGNOSIS — N1832 Chronic kidney disease, stage 3b: Secondary | ICD-10-CM

## 2023-08-21 DIAGNOSIS — I5032 Chronic diastolic (congestive) heart failure: Secondary | ICD-10-CM | POA: Diagnosis not present

## 2023-08-21 DIAGNOSIS — I272 Pulmonary hypertension, unspecified: Secondary | ICD-10-CM | POA: Diagnosis not present

## 2023-08-21 DIAGNOSIS — J849 Interstitial pulmonary disease, unspecified: Secondary | ICD-10-CM

## 2023-08-21 MED ORDER — FUROSEMIDE 20 MG PO TABS: 60.0000 mg | ORAL_TABLET | Freq: Every day | ORAL | 3 refills | Status: DC

## 2023-08-21 NOTE — Patient Instructions (Signed)
Medication Instructions:  STOP TORSEMIDE START FUROSEMIDE 60MG  DAILY *If you need a refill on your cardiac medications before your next appointment, please call your pharmacy*  Lab Work: BMET TODAY If you have labs (blood work) drawn today and your tests are completely normal, you will receive your results only by:    MyChart Message (if you have MyChart) OR A paper copy in the mail If you have any lab test that is abnormal or we need to change your treatment, we will call you to review the results.  Follow-Up: At [facility], you and your health needs are our priority.  As part of our continuing mission to provide you with exceptional heart care, we have created designated Provider Care Teams.  These Care Teams include your primary Cardiologist (physician) and Advanced Practice Providers (APPs -  Physician Assistants and Nurse Practitioners) who all work together to provide you with the care you need, when you need it.  Your next appointment:   KEEP SCHEDULED APPOINTMENT   Provider:   Edd Fabian, FNP-C  Other Instructions DAILY WEIGHTS PLEASE READ AND FOLLOW ATTACHED  SALTY 6

## 2023-08-21 NOTE — Telephone Encounter (Signed)
Auth Submission: APPROVED Site of care: Site of care: [facility] Payer: [employer] Medication & CPT/J Code(s) submitted: Feraheme (ferumoxytol) [id number] Route of submission (phone, fax, portal): auto renewal Phone # Fax # Auth type: Buy/Bill PB Units/visits requested: 510mg   Reference number: [id number] Approval from: 10/21/23 to 10/19/24

## 2023-08-22 LAB — BASIC METABOLIC PANEL
BUN/Creatinine Ratio: 35 — ABNORMAL HIGH (ref 12–28)
BUN: 50 mg/dL — ABNORMAL HIGH (ref 8–27)
CO2: 31 mmol/L — ABNORMAL HIGH (ref 20–29)
Calcium: 9 mg/dL (ref 8.7–10.3)
Chloride: 92 mmol/L — ABNORMAL LOW (ref 96–106)
Creatinine, Ser: 1.43 mg/dL — ABNORMAL HIGH (ref 0.57–1.00)
Glucose: 133 mg/dL — ABNORMAL HIGH (ref 70–99)
Potassium: 4.5 mmol/L (ref 3.5–5.2)
Sodium: 137 mmol/L (ref 134–144)
eGFR: 36 mL/min/{1.73_m2} — ABNORMAL LOW (ref >59)

## 2023-08-24 ENCOUNTER — Other Ambulatory Visit: Payer: Self-pay | Admitting: Hematology and Oncology

## 2023-08-24 ENCOUNTER — Inpatient Hospital Stay: Payer: Medicare PPO

## 2023-08-24 ENCOUNTER — Inpatient Hospital Stay: Payer: Medicare PPO | Admitting: Hematology and Oncology

## 2023-08-24 ENCOUNTER — Inpatient Hospital Stay: Payer: Medicare PPO | Attending: Physician Assistant

## 2023-08-24 VITALS — BP 117/80 | HR 69 | Temp 97.5°F | Resp 13 | Wt 107.9 lb

## 2023-08-24 DIAGNOSIS — E538 Deficiency of other specified B group vitamins: Secondary | ICD-10-CM

## 2023-08-24 DIAGNOSIS — D649 Anemia, unspecified: Secondary | ICD-10-CM

## 2023-08-24 DIAGNOSIS — D539 Nutritional anemia, unspecified: Secondary | ICD-10-CM | POA: Diagnosis not present

## 2023-08-24 DIAGNOSIS — D7589 Other specified diseases of blood and blood-forming organs: Secondary | ICD-10-CM | POA: Insufficient documentation

## 2023-08-24 DIAGNOSIS — N189 Chronic kidney disease, unspecified: Secondary | ICD-10-CM | POA: Insufficient documentation

## 2023-08-24 LAB — CBC WITH DIFFERENTIAL (CANCER CENTER ONLY)
Abs Immature Granulocytes: 0.02 10*3/uL (ref 0.00–0.07)
Basophils Absolute: 0.1 10*3/uL (ref 0.0–0.1)
Basophils Relative: 1 %
Eosinophils Absolute: 0.4 10*3/uL (ref 0.0–0.5)
Eosinophils Relative: 8 %
HCT: 28.5 % — ABNORMAL LOW (ref 36.0–46.0)
Hemoglobin: 9.1 g/dL — ABNORMAL LOW (ref 12.0–15.0)
Immature Granulocytes: 0 %
Lymphocytes Relative: 15 %
Lymphs Abs: 0.8 10*3/uL (ref 0.7–4.0)
MCH: 35.4 pg — ABNORMAL HIGH (ref 26.0–34.0)
MCHC: 31.9 g/dL (ref 30.0–36.0)
MCV: 110.9 fL — ABNORMAL HIGH (ref 80.0–100.0)
Monocytes Absolute: 0.3 10*3/uL (ref 0.1–1.0)
Monocytes Relative: 6 %
Neutro Abs: 3.6 10*3/uL (ref 1.7–7.7)
Neutrophils Relative %: 70 %
Platelet Count: 179 10*3/uL (ref 150–400)
RBC: 2.57 MIL/uL — ABNORMAL LOW (ref 3.87–5.11)
RDW: 13 % (ref 11.5–15.5)
WBC Count: 5.2 10*3/uL (ref 4.0–10.5)
nRBC: 0 % (ref 0.0–0.2)

## 2023-08-24 LAB — CMP (CANCER CENTER ONLY)
ALT: 10 U/L (ref 0–44)
AST: 23 U/L (ref 15–41)
Albumin: 4.1 g/dL (ref 3.5–5.0)
Alkaline Phosphatase: 66 U/L (ref 38–126)
Anion gap: 4 — ABNORMAL LOW (ref 5–15)
BUN: 47 mg/dL — ABNORMAL HIGH (ref 8–23)
CO2: 37 mmol/L — ABNORMAL HIGH (ref 22–32)
Calcium: 9.4 mg/dL (ref 8.9–10.3)
Chloride: 92 mmol/L — ABNORMAL LOW (ref 98–111)
Creatinine: 1.34 mg/dL — ABNORMAL HIGH (ref 0.44–1.00)
GFR, Estimated: 39 mL/min — ABNORMAL LOW (ref >60)
Glucose, Bld: 92 mg/dL (ref 70–99)
Potassium: 4.3 mmol/L (ref 3.5–5.1)
Sodium: 133 mmol/L — ABNORMAL LOW (ref 135–145)
Total Bilirubin: 0.7 mg/dL (ref <1.2)
Total Protein: 6.8 g/dL (ref 6.5–8.1)

## 2023-08-24 LAB — FERRITIN: Ferritin: 939 ng/mL — ABNORMAL HIGH (ref 11–307)

## 2023-08-24 LAB — IRON AND IRON BINDING CAPACITY (CC-WL,HP ONLY)
Iron: 72 ug/dL (ref 28–170)
Saturation Ratios: 34 % — ABNORMAL HIGH (ref 10.4–31.8)
TIBC: 214 ug/dL — ABNORMAL LOW (ref 250–450)
UIBC: 142 ug/dL — ABNORMAL LOW (ref 148–442)

## 2023-08-24 LAB — VITAMIN B12: Vitamin B-12: 580 pg/mL (ref 180–914)

## 2023-08-24 MED ORDER — CYANOCOBALAMIN 1000 MCG/ML IJ SOLN
1000.0000 ug | Freq: Once | INTRAMUSCULAR | Status: AC
Start: 2023-08-24 — End: 2023-08-24
  Administered 2023-08-24: 1000 ug via INTRAMUSCULAR
  Filled 2023-08-24: qty 1

## 2023-08-24 NOTE — Patient Instructions (Signed)
 Vitamin B12 Injection What is this medication? Vitamin B12 (VAHY tuh min B12) prevents and treats low vitamin B12 levels in your body. It is used in people who do not get enough vitamin B12 from their diet or when their digestive tract does not absorb enough. Vitamin B12 plays an important role in maintaining the health of your nervous system and red blood cells. This medicine may be used for other purposes; ask your health care provider or pharmacist if you have questions. COMMON BRAND NAME(S): B-12 Compliance Kit, B-12 Injection Kit, Cyomin, Dodex, LA-12, Nutri-Twelve, Physicians EZ Use B-12, Primabalt, Vitamin Deficiency Injectable System - B12 What should I tell my care team before I take this medication? They need to know if you have any of these conditions: Kidney disease Leber's disease Megaloblastic anemia An unusual or allergic reaction to cyanocobalamin, cobalt, other medications, foods, dyes, or preservatives Pregnant or trying to get pregnant Breast-feeding How should I use this medication? This medication is injected into a muscle or deeply under the skin. It is usually given in a clinic or care team's office. However, your care team may teach you how to inject yourself. Follow all instructions. Talk to your care team about the use of this medication in children. Special care may be needed. Overdosage: If you think you have taken too much of this medicine contact a poison control center or emergency room at once. NOTE: This medicine is only for you. Do not share this medicine with others. What if I miss a dose? If you are given your dose at a clinic or care team's office, call to reschedule your appointment. If you give your own injections, and you miss a dose, take it as soon as you can. If it is almost time for your next dose, take only that dose. Do not take double or extra doses. What may interact with this medication? Alcohol Colchicine This list may not describe all possible  interactions. Give your health care provider a list of all the medicines, herbs, non-prescription drugs, or dietary supplements you use. Also tell them if you smoke, drink alcohol, or use illegal drugs. Some items may interact with your medicine. What should I watch for while using this medication? Visit your care team regularly. You may need blood work done while you are taking this medication. You may need to follow a special diet. Talk to your care team. Limit your alcohol intake and avoid smoking to get the best benefit. What side effects may I notice from receiving this medication? Side effects that you should report to your care team as soon as possible: Allergic reactions--skin rash, itching, hives, swelling of the face, lips, tongue, or throat Swelling of the ankles, hands, or feet Trouble breathing Side effects that usually do not require medical attention (report to your care team if they continue or are bothersome): Diarrhea This list may not describe all possible side effects. Call your doctor for medical advice about side effects. You may report side effects to FDA at 1-800-FDA-1088. Where should I keep my medication? Keep out of the reach of children. Store at room temperature between 15 and 30 degrees C (59 and 85 degrees F). Protect from light. Throw away any unused medication after the expiration date. NOTE: This sheet is a summary. It may not cover all possible information. If you have questions about this medicine, talk to your doctor, pharmacist, or health care provider.  2024 Elsevier/Gold Standard (2021-06-18 00:00:00)

## 2023-08-24 NOTE — Progress Notes (Unsigned)
[facility] [phone]   [phone]  PROGRESS NOTE  Patient Care Team: [name], MD as PCP - General (Internal Medicine) Jens Som Madolyn Frieze, MD as PCP - Cardiology (Cardiology)  CHIEF COMPLAINTS/PURPOSE OF CONSULTATION:  Macrocytic anemia  Vitamin B12 deficiency  HISTORY OF PRESENTING ILLNESS:  On review of the previous records, there is evidence of chronic anemia for several years stable between 10-11. Most recent labs from 2/29/204 showed Hgb had declined to 9.8, MCV 100.3. No other cytopenias. Iron panel shows no evidence of iron deficiency.  She has received periodic iron infusions, most recently received IV feraheme 510 mg x 2 doses in October 2023. She was last given 2 units of PRBC in December 2023 after being hospitalized for fracture of left femur which required intramedullary nailing with ortho. Lastly, she receives retracrit 10,000 units every 4 weeks, last given on 12/30/2022.   INTERVAL HISTORY: Paula Ward 87 y.o. female returns for a follow up for macrocytic anemia.  She is accompanied by her husband for this visit.  She was last seen on 06/30/2023 and in the interim, she has continued on vitamin B12 sublingual supplementation.   On exam today, Paula Ward reports she has been well overall in the since her last visit.  She reports of there was a lot of pressure with the bone marrow biopsy but no residual bleeding or bruising.  She reports that she was in the hospital for leg swelling.  She is also been having some issues with sleeping difficulties.  She reports her energy is low and she is "always tired lately".  She reports that she does not feel like walking the dog a whole lot.  She notes that she is also working on trying to hire some help her home.  She notes that she is breathing much better after undergoing diuresis in the hospital.  Her shortness of breath is unchanged and she continues on supplemental oxygen as needed. She  denies fevers, chills, sweats, chest pain or cough. She has no other complaints. Rest of the 10 ROS is below.   MEDICAL HISTORY:  Past Medical History:  Diagnosis Date   Anemia    Angiodysplasia of stomach and duodenum    Arthus phenomenon    AVM (arteriovenous malformation)    Blood transfusion without reported diagnosis    last 4 units 12-22-15, Iron infusion x2 last -01-07-16,01-14-16.   Breast cancer (HCC) 1989   Left   Candida esophagitis (HCC)    Cataract    Chronic kidney disease    Chronic mild renal insuffiency   Corneal edema    Corneal epithelial basement membrane dystrophy    CREST syndrome (HCC)    GERD (gastroesophageal reflux disease)    w/ HH   Interstitial lung disease (HCC)    Nodule of kidney    Pericardial effusion    PONV (postoperative nausea and vomiting)    Pulmonary embolus (HCC) 2003   Pulmonary hypertension (HCC)    followed by Dr Bland Span at [facility], now Dr. Penni Bombard visit end 2'17.   Rectal incontinence    Renal cell carcinoma (HCC)    Scleroderma (HCC)    Tubular adenoma of colon    Uterine polyp     SURGICAL HISTORY: Past Surgical History:  Procedure Laterality Date   APPENDECTOMY  1962   BREAST LUMPECTOMY  1989   left   CATARACT EXTRACTION, BILATERAL Bilateral 12/2013   COLONOSCOPY WITH PROPOFOL N/A 04/15/2021   Procedure: COLONOSCOPY  WITH PROPOFOL;  Surgeon: Rachael Fee, MD;  Location: [facility];  Service: Endoscopy;  Laterality: N/A;   ENTEROSCOPY N/A 01/18/2016   Procedure: ENTEROSCOPY;  Surgeon: Napoleon Form, MD;  Location: [facility];  Service: Endoscopy;  Laterality: N/A;   ENTEROSCOPY N/A 05/24/2018   Procedure: ENTEROSCOPY;  Surgeon: Lynann Bologna, MD;  Location: [facility];  Service: Endoscopy;  Laterality: N/A;   ENTEROSCOPY N/A 03/29/2021   Procedure: ENTEROSCOPY;  Surgeon: Beverley Fiedler, MD;  Location: [facility];  Service: Gastroenterology;  Laterality: N/A;   ENTEROSCOPY N/A 04/13/2021   Procedure:  ENTEROSCOPY;  Surgeon: Benancio Deeds, MD;  Location: [facility];  Service: Gastroenterology;  Laterality: N/A;   ESOPHAGOGASTRODUODENOSCOPY (EGD) WITH PROPOFOL N/A 12/21/2015   Procedure: ESOPHAGOGASTRODUODENOSCOPY (EGD) WITH PROPOFOL;  Surgeon: Hilarie Fredrickson, MD;  Location: [facility];  Service: Endoscopy;  Laterality: N/A;   HOT HEMOSTASIS N/A 05/24/2018   Procedure: HOT HEMOSTASIS (ARGON PLASMA COAGULATION/BICAP);  Surgeon: Lynann Bologna, MD;  Location: [facility];  Service: Endoscopy;  Laterality: N/A;   HOT HEMOSTASIS N/A 03/29/2021   Procedure: HOT HEMOSTASIS (ARGON PLASMA COAGULATION/BICAP);  Surgeon: Beverley Fiedler, MD;  Location: [facility];  Service: Gastroenterology;  Laterality: N/A;   HOT HEMOSTASIS N/A 04/13/2021   Procedure: HOT HEMOSTASIS (ARGON PLASMA COAGULATION/BICAP);  Surgeon: Benancio Deeds, MD;  Location: [facility];  Service: Gastroenterology;  Laterality: N/A;   INTRAMEDULLARY (IM) NAIL INTERTROCHANTERIC Left 10/11/2022   Procedure: INTRAMEDULLARY (IM) NAIL INTERTROCHANTERIC;  Surgeon: Ollen Gross, MD;  Location: [facility];  Service: Orthopedics;  Laterality: Left;   IR GENERIC HISTORICAL  06/05/2016   IR RADIOLOGIST EVAL & MGMT 06/05/2016 [name], MD [facility] INTERV RAD   IVC Filter     KIDNEY SURGERY     left -"laser surgery by Dr. Fredia Sorrow- 5 yrs ago" no removal   RIGHT/LEFT HEART CATH AND CORONARY ANGIOGRAPHY N/A 04/25/2022   Procedure: RIGHT/LEFT HEART CATH AND CORONARY ANGIOGRAPHY;  Surgeon: Tonny Bollman, MD;  Location: [facility] INVASIVE CV LAB;  Service: Cardiovascular;  Laterality: N/A;   SCHLEROTHERAPY  05/24/2018   Procedure: Theresia Majors;  Surgeon: Lynann Bologna, MD;  Location: [facility];  Service: Endoscopy;;   SUBMUCOSAL TATTOO INJECTION  04/13/2021   Procedure: SUBMUCOSAL TATTOO INJECTION;  Surgeon: Benancio Deeds, MD;  Location: [facility];  Service: Gastroenterology;;   TONSILLECTOMY     TUBAL LIGATION      SOCIAL  HISTORY: Social History   Socioeconomic History   Marital status: Married    Spouse name: Not on file   Number of children: 2   Years of education: Not on file   Highest education level: Not on file  Occupational History   Occupation: Retired  Tobacco Use   Smoking status: Never   Smokeless tobacco: Never  Vaping Use   Vaping status: Never Used  Substance and Sexual Activity   Alcohol use: No   Drug use: No   Sexual activity: Not Currently  Other Topics Concern   Not on file  Social History Narrative   Married '611 son- '65, 1 daughter '63; 6 children (2 adopted)SO- SOBRetirement- doing well. Marriage in good health. Patient has never smoked. Alcohol use- noPt gets regular exercise   Social Determinants of Health   Financial Resource Strain: Low Risk  (04/02/2022)   Overall Financial Resource Strain (CARDIA)    Difficulty of Paying Living Expenses: Not hard at all  Food Insecurity: No Food Insecurity (08/18/2023)   Hunger Vital Sign    Worried About Running  Out of Food in the Last Year: Never true    Ran Out of Food in the Last Year: Never true  Transportation Needs: No Transportation Needs (08/18/2023)   PRAPARE - Administrator, Civil Service (Medical): No    Lack of Transportation (Non-Medical): No  Physical Activity: Inactive (04/02/2022)   Exercise Vital Sign    Days of Exercise per Week: 0 days    Minutes of Exercise per Session: 0 min  Stress: No Stress Concern Present (04/02/2022)   [employer] of Occupational Health - Occupational Stress Questionnaire    Feeling of Stress : Not at all  Social Connections: Socially Integrated (04/02/2022)   Social Connection and Isolation Panel [NHANES]    Frequency of Communication with Friends and Family: More than three times a week    Frequency of Social Gatherings with Friends and Family: More than three times a week    Attends Religious Services: More than 4 times per year    Active Member of [employer] or  Organizations: Yes    Attends Engineer, structural: More than 4 times per year    Marital Status: Married  Catering manager Violence: Not At Risk (08/14/2023)   Humiliation, Afraid, Rape, and Kick questionnaire    Fear of Current or Ex-Partner: No    Emotionally Abused: No    Physically Abused: No    Sexually Abused: No    FAMILY HISTORY: Family History  Problem Relation Age of Onset   Bladder Cancer Father    Diabetes Father    Prostate cancer Father    Alzheimer's disease Mother    Diabetes Sister    Lung cancer Sister         smoker   Esophageal cancer Paternal Uncle    Colon cancer Neg Hx     ALLERGIES:  is allergic to codeine, other, erythromycin, gluten meal, iodinated contrast media, lisinopril, mirtazapine, mycophenolate mofetil, and sulfa antibiotics.  MEDICATIONS:  Current Outpatient Medications  Medication Sig Dispense Refill   acetaminophen (TYLENOL) 325 MG tablet Take 2 tablets (650 mg total) by mouth every 6 (six) hours as needed for moderate pain. 30 tablet 0   albuterol (VENTOLIN HFA) 108 (90 Base) MCG/ACT inhaler INHALE 2 PUFFS INTO THE LUNGS EVERY 6 HOURS AS NEEDED FOR WHEEZING OR SHORTNESS OF BREATH (Patient taking differently: Inhale 2 puffs into the lungs every 6 (six) hours as needed for wheezing or shortness of breath.) 6.7 g 3   augmented betamethasone dipropionate (DIPROLENE-AF) 0.05 % cream Apply 1 application  topically 2 (two) times daily as needed (for irritation).     clobetasol ointment (TEMOVATE) 0.05 % Apply 1 Application topically 2 (two) times daily as needed (for irritation).     docusate sodium (COLACE) 100 MG capsule Take 1 capsule (100 mg total) by mouth 2 (two) times daily. (Patient taking differently: Take 100 mg by mouth in the morning.) 10 capsule 0   empagliflozin (JARDIANCE) 10 MG TABS tablet Take 1 tablet (10 mg total) by mouth daily. 30 tablet 3   famotidine (PEPCID) 20 MG tablet Take 1 tablet (20 mg total) by mouth at  bedtime. 30 tablet 11   fluticasone (FLONASE) 50 MCG/ACT nasal spray USE 1 SPRAY(S) IN EACH NOSTRIL ONCE DAILY AS NEEDED FOR ALLERGIES (Patient taking differently: Place 1 spray into both nostrils in the morning.) 16 g 0   furosemide (LASIX) 20 MG tablet Take 3 tablets (60 mg total) by mouth daily. 90 tablet 3   guaiFENesin (MUCINEX)  600 MG 12 hr tablet Take 300 mg by mouth daily.     ipratropium (ATROVENT) 0.03 % nasal spray Place 2 sprays into both nostrils every 12 (twelve) hours. (Patient taking differently: Place 2 sprays into both nostrils at bedtime.) 30 mL 12   latanoprost (XALATAN) 0.005 % ophthalmic solution Place 1 drop into both eyes at bedtime.     magic mouthwash SOLN Take 10 mLs by mouth 4 (four) times daily as needed for mouth pain. 15 mL 2   mirtazapine (REMERON) 15 MG tablet Take 1 tablet (15 mg total) by mouth at bedtime. (Patient not taking: Reported on 08/21/2023) 30 tablet 3   montelukast (SINGULAIR) 10 MG tablet TAKE 1 TABLET BY MOUTH AT BEDTIME (Patient taking differently: Take 10 mg by mouth at bedtime. TAKE 1 TABLET BY MOUTH AT BEDTIME) 90 tablet 0   mupirocin ointment (BACTROBAN) 2 % Apply 1 Application topically 2 (two) times daily as needed (for irritation).     Nutritional Supplements (ENSURE HIGH PROTEIN) LIQD Take 0.5 Bottles by mouth daily as needed (for supplementation).     OPSUMIT 10 MG tablet Take 1 tablet (10 mg total) by mouth daily. 30 tablet 3   OXYGEN Inhale 2-3 L/min into the lungs continuous.     pantoprazole (PROTONIX) 40 MG tablet Take 1 tablet (40 mg total) by mouth daily. (Patient taking differently: Take 40 mg by mouth daily as needed (for acid reflux).) 30 tablet 11   polyethylene glycol (MIRALAX / GLYCOLAX) 17 g packet Take 17 g by mouth daily. (Patient not taking: Reported on 08/21/2023) 14 each 0   spironolactone (ALDACTONE) 25 MG tablet Take 0.5 tablets (12.5 mg total) by mouth daily.     sucralfate (CARAFATE) 1 GM/10ML suspension TAKE 10 MLS BY  MOUTH EVERY 6 HOURS AS NEEDED FOR REFLUX (Patient taking differently: Take 1 g by mouth every 6 (six) hours as needed (to coat the stomach).) 420 mL 0   triamcinolone cream (KENALOG) 0.1 % Apply 1 Application topically See admin instructions. Apply to affected leg(s) after showers and up to 2 additional times a day as needed for irritation     No current facility-administered medications for this visit.    REVIEW OF SYSTEMS:   Constitutional: ( - ) fevers, ( - )  chills , ( - ) night sweats Eyes: ( - ) blurriness of vision, ( - ) double vision, ( - ) watery eyes Ears, nose, mouth, throat, and face: ( - ) mucositis, ( - ) sore throat Respiratory: ( - ) cough, ( + ) dyspnea, ( - ) wheezes Cardiovascular: ( - ) palpitation, ( - ) chest discomfort, ( - ) lower extremity swelling Gastrointestinal:  ( - ) nausea, ( - ) heartburn, ( - ) change in bowel habits Skin: ( - ) abnormal skin rashes Lymphatics: ( - ) new lymphadenopathy, ( - ) easy bruising Neurological: ( - ) numbness, ( - ) tingling, ( - ) new weaknesses Behavioral/Psych: ( - ) mood change, ( - ) new changes  All other systems were reviewed with the patient and are negative.  PHYSICAL EXAMINATION: ECOG PERFORMANCE STATUS: 1 - Symptomatic but completely ambulatory  Vitals:   08/24/23 1145  BP: 117/80  Pulse: 69  Resp: 13  Temp: (!) 97.5 F (36.4 C)  SpO2: 90%    Filed Weights   08/24/23 1145  Weight: 107 lb 14.4 oz (48.9 kg)     GENERAL: well appearing elderly female in NAD  SKIN:  skin color, texture, turgor are normal, no rashes or significant lesions EYES: conjunctiva are pink and non-injected, sclera clear LUNGS: clear to auscultation and percussion with normal breathing effort HEART: regular rate & rhythm and no murmurs and no lower extremity edema Musculoskeletal: no cyanosis of digits and no clubbing  PSYCH: alert & oriented x 3, fluent speech NEURO: no focal motor/sensory deficits  LABORATORY DATA:  I have  reviewed the data as listed    Latest Ref Rng & Units 08/24/2023   11:12 AM 08/15/2023    7:01 AM 08/14/2023    8:28 AM  CBC  WBC 4.0 - 10.5 K/uL 5.2  6.5  6.6   Hemoglobin 12.0 - 15.0 g/dL 9.1  9.9  9.2   Hematocrit 36.0 - 46.0 % 28.5  31.6  28.9   Platelets 150 - 400 K/uL 179  177  175        Latest Ref Rng & Units 08/24/2023   11:12 AM 08/21/2023    3:12 PM 08/15/2023    7:01 AM  CMP  Glucose 70 - 99 mg/dL 92  782  85   BUN 8 - 23 mg/dL 47  50  45   Creatinine 0.44 - 1.00 mg/dL 9.56  2.13  0.86   Sodium 135 - 145 mmol/L 133  137  136   Potassium 3.5 - 5.1 mmol/L 4.3  4.5  4.9   Chloride 98 - 111 mmol/L 92  92  93   CO2 22 - 32 mmol/L 37  31  33   Calcium 8.9 - 10.3 mg/dL 9.4  9.0  8.8   Total Protein 6.5 - 8.1 g/dL 6.8     Total Bilirubin <1.2 mg/dL 0.7     Alkaline Phos 38 - 126 U/L 66     AST 15 - 41 U/L 23     ALT 0 - 44 U/L 10      RADIOGRAPHIC STUDIES: I have personally reviewed the radiological images as listed and agreed with the findings in the report. ECHOCARDIOGRAM COMPLETE  Result Date: 08/15/2023    ECHOCARDIOGRAM REPORT   Patient Name:   DELMAR DONDERO Date of Exam: 08/15/2023 Medical Rec #:  [id number]       Height:       63.0 in Accession #:    [id number]      Weight:       108.0 lb Date of Birth:  1935-10-30       BSA:          1.488 m Patient Age:    86 years        BP:           158/58 mmHg Patient Gender: F               HR:           93 bpm. Exam Location:  Inpatient Procedure: Cardiac Doppler and Color Doppler Indications:    Cardiomegaly I51.7  History:        Patient has prior history of Echocardiogram examinations, most                 recent 06/05/2023. Cardiomegaly and CHF, Pericardial Effusion,                 Aortic Valve Disease; Risk Factors:Hypertension.  Sonographer:    Dondra Prader RVT RCS Referring Phys: [id number] ANASTASSIA DOUTOVA  Sonographer Comments: Technically challenging study due to limited acoustic windows and Technically difficult study due  to poor echo windows. IMPRESSIONS  1. Left ventricular ejection fraction, by estimation, is >75%. The left ventricle has hyperdynamic function. The left ventricle has no regional wall motion abnormalities. There is severe left ventricular hypertrophy. Left ventricular diastolic parameters are consistent with Grade I diastolic dysfunction (impaired relaxation).  2. Right ventricular systolic function is normal. The right ventricular size is normal.  3. Left atrial size was severely dilated.  4. Large pericardial effusion. The pericardial effusion is circumferential. There is no evidence of cardiac tamponade.  5. The mitral valve is normal in structure. No evidence of mitral valve regurgitation. No evidence of mitral stenosis. Severe mitral annular calcification.  6. The aortic valve is normal in structure. There is severe calcifcation of the aortic valve. There is moderate thickening of the aortic valve. Aortic valve regurgitation is not visualized. Mild aortic valve stenosis. Aortic valve mean gradient measures  14.8 mmHg. Aortic valve Vmax measures 2.59 m/s.  7. The inferior vena cava is normal in size with greater than 50% respiratory variability, suggesting right atrial pressure of 3 mmHg. Comparison(s): Large pericardial effusion noted on prior ECHO, no major change. Conclusion(s)/Recommendation(s): Findings consistent with hypertrophic cardiomyopathy. FINDINGS  Left Ventricle: Left ventricular ejection fraction, by estimation, is >75%. The left ventricle has hyperdynamic function. The left ventricle has no regional wall motion abnormalities. The left ventricular internal cavity size was normal in size. There is severe left ventricular hypertrophy. Left ventricular diastolic parameters are consistent with Grade I diastolic dysfunction (impaired relaxation). Right Ventricle: The right ventricular size is normal. No increase in right ventricular wall thickness. Right ventricular systolic function is normal. Left  Atrium: Left atrial size was severely dilated. Right Atrium: Right atrial size was normal in size. Pericardium: A large pericardial effusion is present. The pericardial effusion is circumferential. There is no evidence of cardiac tamponade. Mitral Valve: The mitral valve is normal in structure. Severe mitral annular calcification. No evidence of mitral valve regurgitation. No evidence of mitral valve stenosis. Tricuspid Valve: The tricuspid valve is normal in structure. Tricuspid valve regurgitation is mild . No evidence of tricuspid stenosis. Aortic Valve: The aortic valve is normal in structure. There is severe calcifcation of the aortic valve. There is moderate thickening of the aortic valve. Aortic valve regurgitation is not visualized. Mild aortic stenosis is present. Aortic valve mean gradient measures 14.8 mmHg. Aortic valve peak gradient measures 26.9 mmHg. Aortic valve area, by VTI measures 1.12 cm. Pulmonic Valve: The pulmonic valve was normal in structure. Pulmonic valve regurgitation is not visualized. No evidence of pulmonic stenosis. Aorta: The aortic root is normal in size and structure. Venous: The inferior vena cava is normal in size with greater than 50% respiratory variability, suggesting right atrial pressure of 3 mmHg. IAS/Shunts: No atrial level shunt detected by color flow Doppler.  LEFT VENTRICLE PLAX 2D LVIDd:         3.20 cm   Diastology LVIDs:         1.90 cm   LV e' medial:   6.30 cm/s LV PW:         1.10 cm   LV E/e' medial: 19.8 LV IVS:        1.50 cm LVOT diam:     1.70 cm LV SV:         51 LV SV Index:   34 LVOT Area:     2.27 cm  RIGHT VENTRICLE             IVC RV Basal diam:  2.40 cm     IVC diam: 1.20 cm RV S prime:     13.30 cm/s TAPSE (M-mode): 1.6 cm LEFT ATRIUM             Index        RIGHT ATRIUM           Index LA diam:        3.60 cm 2.42 cm/m   RA Area:     12.40 cm LA Vol (A2C):   96.6 ml 64.91 ml/m  RA Volume:   27.20 ml  18.28 ml/m LA Vol (A4C):   77.2 ml 51.87  ml/m LA Biplane Vol: 93.0 ml 62.49 ml/m  AORTIC VALVE                     PULMONIC VALVE AV Area (Vmax):    1.15 cm      PV Vmax:       1.09 m/s AV Area (Vmean):   1.10 cm      PV Peak grad:  4.8 mmHg AV Area (VTI):     1.12 cm AV Vmax:           259.40 cm/s AV Vmean:          178.400 cm/s AV VTI:            0.454 m AV Peak Grad:      26.9 mmHg AV Mean Grad:      14.8 mmHg LVOT Vmax:         131.00 cm/s LVOT Vmean:        86.400 cm/s LVOT VTI:          0.224 m LVOT/AV VTI ratio: 0.49  AORTA Ao Root diam: 2.60 cm Ao Asc diam:  2.80 cm MITRAL VALVE                TRICUSPID VALVE MV Area (PHT): 3.34 cm     TR Peak grad:   49.6 mmHg MV Decel Time: 227 msec     TR Vmax:        352.00 cm/s MV E velocity: 125.00 cm/s MV A velocity: 167.00 cm/s  SHUNTS MV E/A ratio:  0.75         Systemic VTI:  0.22 m                             Systemic Diam: 1.70 cm Donato Schultz MD Electronically signed by Donato Schultz MD Signature Date/Time: 08/15/2023/12:37:48 PM    Final    VAS [place] LOWER EXTREMITY VENOUS (DVT) (ONLY [facility])  Result Date: 08/14/2023  Lower Venous DVT Study Patient Name:  QUINTESSA SIMMERMAN  Date of Exam:   08/13/2023 Medical Rec #: [id number]        Accession #:    [id number] Date of Birth: 1936-02-27        Patient Gender: F Patient Age:   21 years Exam Location:  [facility] Procedure:      VAS [place] LOWER EXTREMITY VENOUS (DVT) Referring Phys: Almeta Monas COUNTRYMAN --------------------------------------------------------------------------------  Indications: Swelling.  Risk Factors: Immobility past pregnancy. Comparison Study: No significant changes seen since previous exam 06/25/22 Performing Technologist: Shona Simpson  Examination Guidelines: A complete evaluation includes B-mode imaging, spectral Doppler, color Doppler, and power Doppler as needed of all accessible portions of each vessel. Bilateral testing is considered an integral part of a complete examination. Limited examinations for reoccurring  indications may be performed as noted. The reflux portion of the exam is performed with the patient in reverse Trendelenburg.  +---------+---------------+---------+-----------+----------+--------------+ RIGHT    CompressibilityPhasicitySpontaneityPropertiesThrombus Aging +---------+---------------+---------+-----------+----------+--------------+ CFV      Full           Yes      Yes                                 +---------+---------------+---------+-----------+----------+--------------+ SFJ      Full                                                        +---------+---------------+---------+-----------+----------+--------------+ FV Prox  Full                                                        +---------+---------------+---------+-----------+----------+--------------+ FV Mid   Full                                                        +---------+---------------+---------+-----------+----------+--------------+ FV DistalFull                                                        +---------+---------------+---------+-----------+----------+--------------+ PFV      Full                                                        +---------+---------------+---------+-----------+----------+--------------+ POP      Full           Yes      Yes                                 +---------+---------------+---------+-----------+----------+--------------+ PTV      Full                                                        +---------+---------------+---------+-----------+----------+--------------+ PERO     Full                                                        +---------+---------------+---------+-----------+----------+--------------+   +----+---------------+---------+-----------+----------+--------------+ LEFTCompressibilityPhasicitySpontaneityPropertiesThrombus Aging +----+---------------+---------+-----------+----------+--------------+ CFV Full            Yes      Yes                                 +----+---------------+---------+-----------+----------+--------------+  Summary: RIGHT: - There is no evidence of deep vein thrombosis in the lower extremity.  - No cystic structure found in the popliteal fossa.  LEFT: - No evidence of common femoral vein obstruction.   *See table(s) above for measurements and observations. Electronically signed by Gerarda Fraction on 08/14/2023 at 4:57:49 PM.    Final    CT BONE MARROW BIOPSY & ASPIRATION  Result Date: 08/14/2023 CLINICAL DATA:  Anemia EXAM: CT GUIDED DEEP ILIAC BONE ASPIRATION AND CORE BIOPSY TECHNIQUE: Patient was placed prone on the CT gantry and limited axial scans through the pelvis were obtained. This exam was performed according to the departmental dose-optimization program which includes automated exposure control, adjustment of the mA and/or kV according to patient size and/or use of iterative reconstruction technique. Appropriate skin entry site was identified. Skin site was marked, prepped with chlorhexidine, draped in usual sterile fashion, and infiltrated locally with 1% lidocaine. Intravenous Fentanyl and Versed 0.5mg  were administered as conscious sedation during continuous monitoring of the patient's level of consciousness and physiological / cardiorespiratory status by the radiology RN, with a total moderate sedation time of 11 minutes. Under CT fluoroscopic guidance an 11-gauge Cook trocar bone needle was advanced into the right iliac bone just lateral to the sacroiliac joint. Once needle tip position was confirmed, core and aspiration samples were obtained, submitted to pathology. Patient tolerated procedure well. COMPLICATIONS: COMPLICATIONS none IMPRESSION: 1. Technically successful CT guided right iliac bone core and aspiration biopsy. Electronically Signed   By: Corlis Leak M.D.   On: 08/14/2023 13:42   DG CHEST PORT 1 VIEW  Result Date: 08/13/2023 CLINICAL DATA:   Shortness of breath.  Leg swelling. EXAM: PORTABLE CHEST 1 VIEW COMPARISON:  06/04/2023, CT 04/23/2022 FINDINGS: Chronic cardiomegaly. There may be a pericardial effusion component due to globular configuration. Increasing hazy opacity at the lung bases suspicious for effusions. There is increased vascular congestion and edema. No pneumothorax. IMPRESSION: 1. Increased vascular congestion and edema. 2. Increasing hazy opacity at the lung bases suspicious for effusions. 3. Chronic cardiomegaly and enlargement of the cardiopericardial silhouette. Possible pericardial effusion component. Electronically Signed   By: Narda Rutherford M.D.   On: 08/13/2023 22:11   DG Tibia/Fibula Right  Result Date: 08/13/2023 CLINICAL DATA:  Leg redness and pain. EXAM: RIGHT TIBIA AND FIBULA - 2 VIEW COMPARISON:  None Available. FINDINGS: There is no evidence of fracture or other focal bone lesions. Knee and ankle alignment are maintained. No erosions. Mild soft tissue edema. No radiopaque foreign body or soft tissue gas. Chronic appearing linear calcifications posteriorly. IMPRESSION: Mild soft tissue edema. No acute osseous abnormality. Electronically Signed   By: Narda Rutherford M.D.   On: 08/13/2023 21:51    ASSESSMENT & PLAN ALIZEA PELL is a 87 y.o. female who presents to the hematology clinic for evaluation of macrocytic anemia  #Macrocytic anemia: #Vitamin B12 deficiency: --Etiology unknown but chronic in nature. CKD is part of differential.  --Iron panel from 12/30/2022 reviewed without deficiency.  --Patient has retacrit 10,000 units q 4 weeks, last received 12/30/2022. --Workup from 02/12/2023 didn't reveal hemolysis, paraproteinemia, folate deficiency. There was evidence of vitamin B12 deficiency so patient is currently on vitamin B12 SL replacement every other day.   --Labs today show persistent anemia with Hgb 9.1, WBC 5.2, MCV 110.9, Plt 179 --bone marrow biopsy on 08/14/2023 showed no clear cause for the  macrocytosis, though excessive iron deposition was noted. Will order HFE testing today. More likely the iron deposition  is from chronic disease with CKD.  -- no evidence of cirrhosis on prior imaging from 2023 (Chest imaging, but liver visualized). Can consider liver imaging pending results of today's studies.  --RTC q 4 weeks for labs/injection with clinic visit in 3 months time.   No orders of the defined types were placed in this encounter.   All questions were answered. The patient knows to call the clinic with any problems, questions or concerns.  I have spent a total of 30 minutes minutes of face-to-face and non-face-to-face time, preparing to see the patient, obtaining and/or reviewing separately obtained history, performing a medically appropriate examination, counseling and educating the patient, ordering tests/procedures,documenting clinical information in the electronic health record, and care coordination.   Ulysees Barns, MD Department of Hematology/Oncology [facility] Phone: [phone] Pager: [phone] Email: [email or web address]

## 2023-08-24 NOTE — Telephone Encounter (Signed)
Called [facility] for update on Opsumit Rx. Per rep and last note from 08/19/2023, Rubye Oaks, NP is still not enrolled into [facility]. I advised that we received confirmation of her enrollment in [facility] for macitentan  Provided my direct phone number and requested call back if any issues again  Chesley Mires, PharmD, MPH, BCPS, CPP Clinical Pharmacist (Rheumatology and Pulmonology)

## 2023-08-25 ENCOUNTER — Encounter (HOSPITAL_COMMUNITY): Payer: Self-pay

## 2023-08-25 ENCOUNTER — Ambulatory Visit: Payer: Medicare PPO | Admitting: Adult Health

## 2023-08-25 ENCOUNTER — Ambulatory Visit: Payer: Medicare PPO | Admitting: Pulmonary Disease

## 2023-08-25 ENCOUNTER — Encounter: Payer: Self-pay | Admitting: Physician Assistant

## 2023-08-25 DIAGNOSIS — J849 Interstitial pulmonary disease, unspecified: Secondary | ICD-10-CM | POA: Diagnosis not present

## 2023-08-25 LAB — PULMONARY FUNCTION TEST
DL/VA % pred: 119 %
DL/VA: 4.87 ml/min/mmHg/L
DLCO cor % pred: 47 %
DLCO cor: 8.46 ml/min/mmHg
DLCO unc % pred: 41 %
DLCO unc: 7.39 ml/min/mmHg
FEF 25-75 Post: 0.67 L/s
FEF 25-75 Pre: 0.68 L/s
FEF2575-%Change-Post: -1 %
FEF2575-%Pred-Post: 61 %
FEF2575-%Pred-Pre: 62 %
FEV1-%Change-Post: 0 %
FEV1-%Pred-Post: 45 %
FEV1-%Pred-Pre: 45 %
FEV1-Post: 0.76 L
FEV1-Pre: 0.76 L
FEV1FVC-%Change-Post: 5 %
FEV1FVC-%Pred-Pre: 108 %
FEV6-%Change-Post: -5 %
FEV6-%Pred-Post: 42 %
FEV6-%Pred-Pre: 45 %
FEV6-Post: 0.91 L
FEV6-Pre: 0.96 L
FEV6FVC-%Pred-Post: 106 %
FEV6FVC-%Pred-Pre: 106 %
FVC-%Change-Post: -5 %
FVC-%Pred-Post: 40 %
FVC-%Pred-Pre: 42 %
FVC-Post: 0.91 L
FVC-Pre: 0.96 L
Post FEV1/FVC ratio: 83 %
Post FEV6/FVC ratio: 100 %
Pre FEV1/FVC ratio: 79 %
Pre FEV6/FVC Ratio: 100 %
RV % pred: 86 %
RV: 2.1 L
TLC % pred: 61 %
TLC: 3.02 L

## 2023-08-25 NOTE — Telephone Encounter (Signed)
Called [facility] - they still have not had [facility] internal access rep respond to request. Rep has notated that patient is almost out of medication  Chesley Mires, PharmD, MPH, BCPS, CPP Clinical Pharmacist (Rheumatology and Pulmonology)

## 2023-08-25 NOTE — Progress Notes (Signed)
Full PFT performed today. °

## 2023-08-25 NOTE — Patient Instructions (Signed)
Full PFT performed today. °

## 2023-08-26 ENCOUNTER — Telehealth: Payer: Self-pay | Admitting: Cardiology

## 2023-08-26 ENCOUNTER — Inpatient Hospital Stay: Payer: Medicare PPO

## 2023-08-26 LAB — METHYLMALONIC ACID, SERUM: Methylmalonic Acid, Quantitative: 621 nmol/L — ABNORMAL HIGH (ref 0–378)

## 2023-08-26 NOTE — Telephone Encounter (Signed)
NP cleaver please advise  Patient has follow up 11/14  Called and spoke to patient  Patient states:   -takes three lasix daily (60MG ) since 11/1  -has not lost any weight since increasing lasix   -gained 1lb this morning   -has increased urination   -has a little more swelling in left leg/ankle   -Yesterday weight was 106, today weight is 107.3  -completed breathing test at pulmonary yesterday  Patient denies:   -SOB/difficulty breathing  Advised patient continue taking lasix as directed and checking weight daily, RN will outreach with updates  Informed patient message sent to NP Cleaver for input/advisement  Reviewed ED warning signs/precautions  Patient verbalized understanding, no questions at this time

## 2023-08-26 NOTE — Telephone Encounter (Signed)
Pt c/o swelling/edema: STAT if pt has developed SOB within 24 hours  If swelling, where is the swelling located? legs  How much weight have you gained and in what time span? Patient states maybe a pound  Have you gained 2 pounds in a day or 5 pounds in a week? no  Do you have a log of your daily weights (if so, list)?11/1 106          11/2 106 11/3 106 11/4 106 11/5 106 11/6 107  Are you currently taking a fluid pill? yes  Are you currently SOB? no  Have you traveled recently in a car or plane for an extended period of time? No  Patient is concerned because she is on lasix and she hasn't lost weight.

## 2023-08-26 NOTE — Telephone Encounter (Signed)
Received message from NP Cleaver:   Please contact Ms. Junio and let her know that I have reviewed her weights.  At this time we will have her continue to monitor.  Continue current medication regimen.  Thank you.   Paula Ward. Cleaver NP-C  ______________________________________________________________________ Jeanene Erb and spoke to patient  Relayed provider message  Advised patient to outreach if has >3lb weight gain in 24 hours  Reviewed ED warning signs/precautions  Patient agrees with plan, no questions at this time

## 2023-08-27 NOTE — Telephone Encounter (Signed)
PT calling back stating she is still having issues getting her medication. She needs a call back please. Thanks.

## 2023-08-27 NOTE — Telephone Encounter (Signed)
Called [facility] for update on Opsumit prescription. Per rep, they are missing entire [place] enrollment form now. Called [facility] for update. They are requesting enrollment and rx form now since last form was received in 2022.  Relayed inforation to patient - she states she has about 4-5 days left of medication. Will f/u with pharmacy tomorrow again  Chesley Mires, PharmD, MPH, BCPS, CPP Clinical Pharmacist (Rheumatology and Pulmonology)

## 2023-08-29 ENCOUNTER — Other Ambulatory Visit: Payer: Self-pay

## 2023-08-29 ENCOUNTER — Inpatient Hospital Stay (HOSPITAL_COMMUNITY)
Admission: EM | Admit: 2023-08-29 | Discharge: 2023-09-03 | DRG: 291 | Disposition: A | Discharge status: Home Health | Payer: Medicare PPO | Attending: Internal Medicine | Admitting: Internal Medicine

## 2023-08-29 ENCOUNTER — Telehealth: Payer: Self-pay | Admitting: Physician Assistant

## 2023-08-29 ENCOUNTER — Encounter (HOSPITAL_COMMUNITY): Payer: Self-pay | Admitting: Emergency Medicine

## 2023-08-29 ENCOUNTER — Emergency Department (HOSPITAL_COMMUNITY): Payer: Medicare PPO

## 2023-08-29 DIAGNOSIS — Z7984 Long term (current) use of oral hypoglycemic drugs: Secondary | ICD-10-CM

## 2023-08-29 DIAGNOSIS — I3139 Other pericardial effusion (noninflammatory): Secondary | ICD-10-CM | POA: Diagnosis not present

## 2023-08-29 DIAGNOSIS — I130 Hypertensive heart and chronic kidney disease with heart failure and stage 1 through stage 4 chronic kidney disease, or unspecified chronic kidney disease: Secondary | ICD-10-CM | POA: Diagnosis not present

## 2023-08-29 DIAGNOSIS — E871 Hypo-osmolality and hyponatremia: Secondary | ICD-10-CM | POA: Diagnosis not present

## 2023-08-29 DIAGNOSIS — Z800 Family history of malignant neoplasm of digestive organs: Secondary | ICD-10-CM

## 2023-08-29 DIAGNOSIS — D539 Nutritional anemia, unspecified: Secondary | ICD-10-CM | POA: Diagnosis not present

## 2023-08-29 DIAGNOSIS — R7989 Other specified abnormal findings of blood chemistry: Secondary | ICD-10-CM | POA: Diagnosis not present

## 2023-08-29 DIAGNOSIS — R54 Age-related physical debility: Secondary | ICD-10-CM | POA: Diagnosis present

## 2023-08-29 DIAGNOSIS — Z833 Family history of diabetes mellitus: Secondary | ICD-10-CM

## 2023-08-29 DIAGNOSIS — Z885 Allergy status to narcotic agent status: Secondary | ICD-10-CM

## 2023-08-29 DIAGNOSIS — K219 Gastro-esophageal reflux disease without esophagitis: Secondary | ICD-10-CM | POA: Diagnosis present

## 2023-08-29 DIAGNOSIS — Z79899 Other long term (current) drug therapy: Secondary | ICD-10-CM

## 2023-08-29 DIAGNOSIS — N1832 Chronic kidney disease, stage 3b: Secondary | ICD-10-CM | POA: Diagnosis not present

## 2023-08-29 DIAGNOSIS — Z801 Family history of malignant neoplasm of trachea, bronchus and lung: Secondary | ICD-10-CM | POA: Diagnosis not present

## 2023-08-29 DIAGNOSIS — D649 Anemia, unspecified: Secondary | ICD-10-CM | POA: Diagnosis not present

## 2023-08-29 DIAGNOSIS — M341 CR(E)ST syndrome: Secondary | ICD-10-CM | POA: Diagnosis present

## 2023-08-29 DIAGNOSIS — I080 Rheumatic disorders of both mitral and aortic valves: Secondary | ICD-10-CM | POA: Diagnosis present

## 2023-08-29 DIAGNOSIS — J90 Pleural effusion, not elsewhere classified: Secondary | ICD-10-CM | POA: Diagnosis not present

## 2023-08-29 DIAGNOSIS — I2729 Other secondary pulmonary hypertension: Secondary | ICD-10-CM | POA: Diagnosis present

## 2023-08-29 DIAGNOSIS — Z1152 Encounter for screening for COVID-19: Secondary | ICD-10-CM

## 2023-08-29 DIAGNOSIS — Z853 Personal history of malignant neoplasm of breast: Secondary | ICD-10-CM

## 2023-08-29 DIAGNOSIS — Z881 Allergy status to other antibiotic agents status: Secondary | ICD-10-CM

## 2023-08-29 DIAGNOSIS — I5031 Acute diastolic (congestive) heart failure: Secondary | ICD-10-CM | POA: Diagnosis not present

## 2023-08-29 DIAGNOSIS — Z8052 Family history of malignant neoplasm of bladder: Secondary | ICD-10-CM

## 2023-08-29 DIAGNOSIS — J849 Interstitial pulmonary disease, unspecified: Principal | ICD-10-CM

## 2023-08-29 DIAGNOSIS — I5033 Acute on chronic diastolic (congestive) heart failure: Secondary | ICD-10-CM | POA: Diagnosis not present

## 2023-08-29 DIAGNOSIS — Z9981 Dependence on supplemental oxygen: Secondary | ICD-10-CM | POA: Diagnosis not present

## 2023-08-29 DIAGNOSIS — M349 Systemic sclerosis, unspecified: Secondary | ICD-10-CM

## 2023-08-29 DIAGNOSIS — Z95828 Presence of other vascular implants and grafts: Secondary | ICD-10-CM | POA: Diagnosis not present

## 2023-08-29 DIAGNOSIS — Z8042 Family history of malignant neoplasm of prostate: Secondary | ICD-10-CM

## 2023-08-29 DIAGNOSIS — I10 Essential (primary) hypertension: Secondary | ICD-10-CM | POA: Diagnosis not present

## 2023-08-29 DIAGNOSIS — I440 Atrioventricular block, first degree: Secondary | ICD-10-CM | POA: Diagnosis present

## 2023-08-29 DIAGNOSIS — Z882 Allergy status to sulfonamides status: Secondary | ICD-10-CM

## 2023-08-29 DIAGNOSIS — J9612 Chronic respiratory failure with hypercapnia: Secondary | ICD-10-CM

## 2023-08-29 DIAGNOSIS — D631 Anemia in chronic kidney disease: Secondary | ICD-10-CM | POA: Diagnosis present

## 2023-08-29 DIAGNOSIS — J9611 Chronic respiratory failure with hypoxia: Secondary | ICD-10-CM | POA: Diagnosis not present

## 2023-08-29 DIAGNOSIS — I350 Nonrheumatic aortic (valve) stenosis: Secondary | ICD-10-CM | POA: Diagnosis not present

## 2023-08-29 DIAGNOSIS — Z820 Family history of epilepsy and other diseases of the nervous system: Secondary | ICD-10-CM

## 2023-08-29 DIAGNOSIS — Z888 Allergy status to other drugs, medicaments and biological substances status: Secondary | ICD-10-CM

## 2023-08-29 DIAGNOSIS — Z681 Body mass index (BMI) 19 or less, adult: Secondary | ICD-10-CM

## 2023-08-29 DIAGNOSIS — Z85528 Personal history of other malignant neoplasm of kidney: Secondary | ICD-10-CM

## 2023-08-29 DIAGNOSIS — I110 Hypertensive heart disease with heart failure: Secondary | ICD-10-CM | POA: Diagnosis not present

## 2023-08-29 DIAGNOSIS — Z85038 Personal history of other malignant neoplasm of large intestine: Secondary | ICD-10-CM

## 2023-08-29 DIAGNOSIS — I509 Heart failure, unspecified: Secondary | ICD-10-CM | POA: Diagnosis not present

## 2023-08-29 DIAGNOSIS — R918 Other nonspecific abnormal finding of lung field: Secondary | ICD-10-CM | POA: Diagnosis not present

## 2023-08-29 DIAGNOSIS — Z860101 Personal history of adenomatous and serrated colon polyps: Secondary | ICD-10-CM

## 2023-08-29 DIAGNOSIS — I2721 Secondary pulmonary arterial hypertension: Secondary | ICD-10-CM | POA: Diagnosis not present

## 2023-08-29 DIAGNOSIS — R0602 Shortness of breath: Secondary | ICD-10-CM | POA: Diagnosis present

## 2023-08-29 DIAGNOSIS — Z91041 Radiographic dye allergy status: Secondary | ICD-10-CM

## 2023-08-29 DIAGNOSIS — J811 Chronic pulmonary edema: Secondary | ICD-10-CM | POA: Diagnosis not present

## 2023-08-29 HISTORY — DX: Nonrheumatic aortic (valve) stenosis: I35.0

## 2023-08-29 HISTORY — DX: Chronic kidney disease, stage 3b: N18.32

## 2023-08-29 HISTORY — DX: Chronic respiratory failure, unspecified whether with hypoxia or hypercapnia: J96.10

## 2023-08-29 LAB — CBC WITH DIFFERENTIAL/PLATELET
Abs Immature Granulocytes: 0.02 10*3/uL (ref 0.00–0.07)
Basophils Absolute: 0.1 10*3/uL (ref 0.0–0.1)
Basophils Relative: 1 %
Eosinophils Absolute: 0.3 10*3/uL (ref 0.0–0.5)
Eosinophils Relative: 5 %
HCT: 26.5 % — ABNORMAL LOW (ref 36.0–46.0)
Hemoglobin: 8.5 g/dL — ABNORMAL LOW (ref 12.0–15.0)
Immature Granulocytes: 0 %
Lymphocytes Relative: 14 %
Lymphs Abs: 0.9 10*3/uL (ref 0.7–4.0)
MCH: 35.7 pg — ABNORMAL HIGH (ref 26.0–34.0)
MCHC: 32.1 g/dL (ref 30.0–36.0)
MCV: 111.3 fL — ABNORMAL HIGH (ref 80.0–100.0)
Monocytes Absolute: 0.4 10*3/uL (ref 0.1–1.0)
Monocytes Relative: 6 %
Neutro Abs: 5 10*3/uL (ref 1.7–7.7)
Neutrophils Relative %: 74 %
Platelets: 157 10*3/uL (ref 150–400)
RBC: 2.38 MIL/uL — ABNORMAL LOW (ref 3.87–5.11)
RDW: 13.2 % (ref 11.5–15.5)
WBC: 6.7 10*3/uL (ref 4.0–10.5)
nRBC: 0 % (ref 0.0–0.2)

## 2023-08-29 LAB — COMPREHENSIVE METABOLIC PANEL
ALT: 13 U/L (ref 0–44)
AST: 27 U/L (ref 15–41)
Albumin: 3.9 g/dL (ref 3.5–5.0)
Alkaline Phosphatase: 54 U/L (ref 38–126)
Anion gap: 8 (ref 5–15)
BUN: 49 mg/dL — ABNORMAL HIGH (ref 8–23)
CO2: 31 mmol/L (ref 22–32)
Calcium: 8.4 mg/dL — ABNORMAL LOW (ref 8.9–10.3)
Chloride: 91 mmol/L — ABNORMAL LOW (ref 98–111)
Creatinine, Ser: 1.33 mg/dL — ABNORMAL HIGH (ref 0.44–1.00)
GFR, Estimated: 39 mL/min — ABNORMAL LOW (ref >60)
Glucose, Bld: 95 mg/dL (ref 70–99)
Potassium: 4.7 mmol/L (ref 3.5–5.1)
Sodium: 130 mmol/L — ABNORMAL LOW (ref 135–145)
Total Bilirubin: 1 mg/dL (ref <1.2)
Total Protein: 6.8 g/dL (ref 6.5–8.1)

## 2023-08-29 LAB — MAGNESIUM: Magnesium: 2.3 mg/dL (ref 1.7–2.4)

## 2023-08-29 LAB — RESP PANEL BY RT-PCR (RSV, FLU A&B, COVID)  RVPGX2
Influenza A by PCR: NEGATIVE
Influenza B by PCR: NEGATIVE
Resp Syncytial Virus by PCR: NEGATIVE
SARS Coronavirus 2 by RT PCR: NEGATIVE

## 2023-08-29 LAB — TROPONIN I (HIGH SENSITIVITY)
Troponin I (High Sensitivity): 22 ng/L — ABNORMAL HIGH (ref <18)
Troponin I (High Sensitivity): 23 ng/L — ABNORMAL HIGH (ref <18)

## 2023-08-29 LAB — BRAIN NATRIURETIC PEPTIDE: B Natriuretic Peptide: 2168.5 pg/mL — ABNORMAL HIGH (ref 0.0–100.0)

## 2023-08-29 MED ORDER — FUROSEMIDE 10 MG/ML IJ SOLN
40.0000 mg | Freq: Two times a day (BID) | INTRAMUSCULAR | Status: DC
  Administered 2023-08-30 (×2): 40 mg via INTRAVENOUS
  Filled 2023-08-29 (×2): qty 4

## 2023-08-29 MED ORDER — ALBUTEROL SULFATE HFA 108 (90 BASE) MCG/ACT IN AERS: 2.0000 | INHALATION_SPRAY | Freq: Four times a day (QID) | RESPIRATORY_TRACT | Status: DC | PRN

## 2023-08-29 MED ORDER — MONTELUKAST SODIUM 10 MG PO TABS
10.0000 mg | ORAL_TABLET | Freq: Every day | ORAL | Status: DC
  Administered 2023-08-29 – 2023-09-02 (×5): 10 mg via ORAL
  Filled 2023-08-29 (×5): qty 1

## 2023-08-29 MED ORDER — FUROSEMIDE 10 MG/ML IJ SOLN
40.0000 mg | Freq: Once | INTRAMUSCULAR | Status: AC
  Administered 2023-08-29: 40 mg via INTRAVENOUS
  Filled 2023-08-29: qty 4

## 2023-08-29 MED ORDER — ONDANSETRON HCL 4 MG PO TABS: 4.0000 mg | ORAL_TABLET | Freq: Four times a day (QID) | ORAL | Status: DC | PRN

## 2023-08-29 MED ORDER — HEPARIN SODIUM (PORCINE) 5000 UNIT/ML IJ SOLN
5000.0000 [IU] | Freq: Three times a day (TID) | INTRAMUSCULAR | Status: DC
  Administered 2023-08-30: 5000 [IU] via SUBCUTANEOUS
  Filled 2023-08-29: qty 1

## 2023-08-29 MED ORDER — SPIRONOLACTONE 12.5 MG HALF TABLET
12.5000 mg | ORAL_TABLET | Freq: Every day | ORAL | Status: DC
  Administered 2023-08-30 – 2023-09-03 (×5): 12.5 mg via ORAL
  Filled 2023-08-29 (×5): qty 1

## 2023-08-29 MED ORDER — SODIUM CHLORIDE 0.9% FLUSH
3.0000 mL | Freq: Two times a day (BID) | INTRAVENOUS | Status: DC
  Administered 2023-08-29 – 2023-09-03 (×9): 3 mL via INTRAVENOUS

## 2023-08-29 MED ORDER — ALBUTEROL SULFATE (2.5 MG/3ML) 0.083% IN NEBU
2.5000 mg | INHALATION_SOLUTION | Freq: Four times a day (QID) | RESPIRATORY_TRACT | Status: DC | PRN
  Administered 2023-08-30 – 2023-09-02 (×5): 2.5 mg via RESPIRATORY_TRACT
  Filled 2023-08-29 (×5): qty 3

## 2023-08-29 MED ORDER — SENNOSIDES-DOCUSATE SODIUM 8.6-50 MG PO TABS: 1.0000 | ORAL_TABLET | Freq: Every evening | ORAL | Status: DC | PRN

## 2023-08-29 MED ORDER — ACETAMINOPHEN 325 MG PO TABS
650.0000 mg | ORAL_TABLET | Freq: Four times a day (QID) | ORAL | Status: DC | PRN
  Administered 2023-08-30 – 2023-09-03 (×3): 650 mg via ORAL
  Filled 2023-08-29 (×3): qty 2

## 2023-08-29 MED ORDER — ONDANSETRON HCL 4 MG/2ML IJ SOLN: 4.0000 mg | Freq: Four times a day (QID) | INTRAMUSCULAR | Status: DC | PRN

## 2023-08-29 MED ORDER — ACETAMINOPHEN 650 MG RE SUPP: 650.0000 mg | Freq: Four times a day (QID) | RECTAL | Status: DC | PRN

## 2023-08-29 NOTE — ED Triage Notes (Signed)
Patient arrives ambulatory by POV c/o shortness of breath and 3lb weight gain since last night. Patient on 3L [place] at baseline. Worse when laying flat. States she increased her lasix this morning. Called heart failure clinic and was advised to be evaluated.

## 2023-08-29 NOTE — ED Notes (Signed)
..ED TO INPATIENT HANDOFF REPORT  Name/Age/Gender Paula Ward 87 y.o. female  Code Status    Code Status Orders  (From admission, onward)           Start     Ordered   08/29/23 2147  Full code  Continuous       Question:  By:  Answer:  Consent: discussion documented in EHR   08/29/23 2146           Code Status History     Date Active Date Inactive Code Status Order ID Comments User Context   08/13/2023 2248 08/15/2023 1859 Full Code [id number]  Therisa Doyne, MD ED   06/04/2023 2338 06/06/2023 2005 DNR [id number]  John Giovanni, MD ED   06/04/2023 2307 06/04/2023 2338 DNR [id number]  John Giovanni, MD ED   10/10/2022 2231 10/15/2022 2253 DNR [id number]  Orland Mustard, MD ED   10/10/2022 2216 10/10/2022 2231 DNR [id number]  Orland Mustard, MD ED   04/25/2022 1146 04/26/2022 1553 Full Code [id number]  Tonny Bollman, MD Inpatient   03/17/2022 2232 03/22/2022 1920 Full Code [id number]  Therisa Doyne, MD ED   04/12/2021 2027 04/15/2021 2103 Full Code [id number]  Hillary Bow, DO ED   03/28/2021 1938 04/01/2021 1650 Full Code [id number]  Angie Fava, DO Inpatient   05/23/2018 1750 05/25/2018 1736 Full Code [id number]  Burnadette Pop, MD Inpatient   12/20/2015 2233 12/23/2015 1651 Partial Code [id number]  Eduard Clos, MD Inpatient   04/05/2012 1526 04/06/2012 2209 Full Code [id number]  Hartley Barefoot, RN Inpatient       Home/SNF/Other Home  Chief Complaint Acute on chronic diastolic (congestive) heart failure (HCC) [I50.33]  Level of Care/Admitting Diagnosis ED Disposition     ED Disposition  Admit   Condition  --   Comment  Hospital Area: [facility] [[id number]]  Level of Care: Telemetry [5]  Admit to tele based on following criteria: Acute CHF  May admit patient to [facility] or [facility] if equivalent level of care is available:: Yes  Covid Evaluation: Confirmed COVID Negative  Diagnosis: Acute on chronic diastolic  (congestive) heart failure [facility]) [[id number]]  Admitting Physician: Briscoe Deutscher [[id number]]  Attending Physician: Briscoe Deutscher [[id number]]  Certification:: I certify this patient will need inpatient services for at least 2 midnights  Expected Medical Readiness: 08/31/2023          Medical History Past Medical History:  Diagnosis Date   Anemia    Angiodysplasia of stomach and duodenum    Arthus phenomenon    AVM (arteriovenous malformation)    Blood transfusion without reported diagnosis    last 4 units 12-22-15, Iron infusion x2 last -01-07-16,01-14-16.   Breast cancer (HCC) 1989   Left   Candida esophagitis (HCC)    Cataract    Chronic kidney disease    Chronic mild renal insuffiency   Corneal edema    Corneal epithelial basement membrane dystrophy    CREST syndrome (HCC)    GERD (gastroesophageal reflux disease)    w/ HH   Interstitial lung disease (HCC)    Nodule of kidney    Pericardial effusion    PONV (postoperative nausea and vomiting)    Pulmonary embolus (HCC) 2003   Pulmonary hypertension (HCC)    [facility] by Dr Bland Span [facility], now Dr. Penni Bombard visit end 2'17.   Rectal incontinence    Renal cell carcinoma (HCC)    Scleroderma (HCC)    Tubular adenoma of  colon    Uterine polyp     Allergies Allergies  Allergen Reactions   Codeine Nausea Only    Hallucinations, too   Other Nausea And Vomiting and Other (See Comments)    "-mycin" antibiotics.   Also cause hallucinations.   Erythromycin Nausea And Vomiting   Gluten Meal Diarrhea   Iodinated Contrast Media Other (See Comments)    Renal issues   Lisinopril     Pt doesn't remember reaction   Mirtazapine Other (See Comments)    "Threw me into orbit"   Mycophenolate Mofetil Nausea Only   Sulfa Antibiotics Other (See Comments)    Unknown reaction    IV Location/Drains/Wounds Patient Lines/Drains/Airways Status     Active Line/Drains/Airways     Name Placement date Placement time Site Days    Peripheral IV 08/29/23 20 G Right Antecubital 08/29/23  2124  Antecubital  less than 1   Pressure Injury 03/18/22 Buttocks Right Stage 1 -  Intact skin with non-blanchable redness of a localized area usually over a bony prominence. 03/18/22  0825  -- 529   Wound / Incision (Open or Dehisced) 08/14/23 Puncture Pelvis Right;Upper 08/14/23  9528  Pelvis  15            Labs/Imaging Results for orders placed or performed during the hospital encounter of 08/29/23 (from the past 48 hour(s))  Brain natriuretic peptide     Status: Abnormal   Collection Time: 08/29/23  4:13 PM  Result Value Ref Range   B Natriuretic Peptide 2,168.5 (H) 0.0 - 100.0 pg/mL    Comment: Performed at [facility], [address] [place], [place] [address]  CBC with Differential/Platelet     Status: Abnormal   Collection Time: 08/29/23  4:13 PM  Result Value Ref Range   WBC 6.7 4.0 - 10.5 K/uL   RBC 2.38 (L) 3.87 - 5.11 MIL/uL   Hemoglobin 8.5 (L) 12.0 - 15.0 g/dL   HCT 40.1 (L) 02.7 - 25.3 %   MCV 111.3 (H) 80.0 - 100.0 fL   MCH 35.7 (H) 26.0 - 34.0 pg   MCHC 32.1 30.0 - 36.0 g/dL   RDW 66.4 40.3 - 47.4 %   Platelets 157 150 - 400 K/uL   nRBC 0.0 0.0 - 0.2 %   Neutrophils Relative % 74 %   Neutro Abs 5.0 1.7 - 7.7 K/uL   Lymphocytes Relative 14 %   Lymphs Abs 0.9 0.7 - 4.0 K/uL   Monocytes Relative 6 %   Monocytes Absolute 0.4 0.1 - 1.0 K/uL   Eosinophils Relative 5 %   Eosinophils Absolute 0.3 0.0 - 0.5 K/uL   Basophils Relative 1 %   Basophils Absolute 0.1 0.0 - 0.1 K/uL   Immature Granulocytes 0 %   Abs Immature Granulocytes 0.02 0.00 - 0.07 K/uL    Comment: Performed at [facility], [address], [place], [place] [address]  Comprehensive metabolic panel     Status: Abnormal   Collection Time: 08/29/23  4:13 PM  Result Value Ref Range   Sodium 130 (L) 135 - 145 mmol/L   Potassium 4.7 3.5 - 5.1 mmol/L   Chloride 91 (L) 98 - 111 mmol/L   CO2 31 22 - 32  mmol/L   Glucose, Bld 95 70 - 99 mg/dL    Comment: Glucose reference range applies only to samples taken after fasting for at least 8 hours.   BUN 49 (H) 8 - 23 mg/dL   Creatinine,  Ser 1.33 (H) 0.44 - 1.00 mg/dL   Calcium 8.4 (L) 8.9 - 10.3 mg/dL   Total Protein 6.8 6.5 - 8.1 g/dL   Albumin 3.9 3.5 - 5.0 g/dL   AST 27 15 - 41 U/L   ALT 13 0 - 44 U/L   Alkaline Phosphatase 54 38 - 126 U/L   Total Bilirubin 1.0 <1.2 mg/dL   GFR, Estimated 39 (L) >60 mL/min    Comment: (NOTE) Calculated using the CKD-EPI Creatinine Equation (2021)    Anion gap 8 5 - 15    Comment: Performed at [facility], [address] [place], [place] [address]  Magnesium     Status: None   Collection Time: 08/29/23  4:13 PM  Result Value Ref Range   Magnesium 2.3 1.7 - 2.4 mg/dL    Comment: Performed at [facility], [address], [place], [place] [address]  Troponin I (High Sensitivity)     Status: Abnormal   Collection Time: 08/29/23  4:13 PM  Result Value Ref Range   Troponin I (High Sensitivity) 22 (H) <18 ng/L    Comment: (NOTE) Elevated high sensitivity troponin I (hsTnI) values and significant  changes across serial measurements may suggest ACS but many other  chronic and acute conditions are known to elevate hsTnI results.  Refer to the "Links" section for chest pain algorithms and additional  guidance. Performed at [facility], [address], [place], [place] [address]   Resp panel by RT-PCR (RSV, Flu A&B, Covid) Anterior Nasal Swab     Status: None   Collection Time: 08/29/23  4:25 PM   Specimen: Anterior Nasal Swab  Result Value Ref Range   SARS Coronavirus 2 by RT PCR NEGATIVE NEGATIVE    Comment: (NOTE) SARS-CoV-2 target nucleic acids are NOT DETECTED.  The SARS-CoV-2 RNA is generally detectable in upper respiratory specimens during the acute phase of infection. The lowest concentration of SARS-CoV-2 viral copies this assay can  detect is 138 copies/mL. A negative result does not preclude SARS-Cov-2 infection and should not be used as the sole basis for treatment or other patient management decisions. A negative result may occur with  improper specimen collection/handling, submission of specimen other than nasopharyngeal swab, presence of viral mutation(s) within the areas targeted by this assay, and inadequate number of viral copies(<138 copies/mL). A negative result must be combined with clinical observations, patient history, and epidemiological information. The expected result is Negative.  Fact Sheet for Patients:  BloggerCourse.com  Fact Sheet for Healthcare Providers:  SeriousBroker.it  This test is no t yet approved or cleared by the [place] FDA and  has been authorized for detection and/or diagnosis of SARS-CoV-2 by FDA under an Emergency Use Authorization (EUA). This EUA will remain  in effect (meaning this test can be used) for the duration of the COVID-19 declaration under Section 564(b)(1) of the Act, 21 U.S.C.section 360bbb-3(b)(1), unless the authorization is terminated  or revoked sooner.       Influenza A by PCR NEGATIVE NEGATIVE   Influenza B by PCR NEGATIVE NEGATIVE    Comment: (NOTE) The Xpert Xpress SARS-CoV-2/FLU/RSV plus assay is intended as an aid in the diagnosis of influenza from Nasopharyngeal swab specimens and should not be used as a sole basis for treatment. Nasal washings and aspirates are unacceptable for Xpert Xpress SARS-CoV-2/FLU/RSV testing.  Fact Sheet for Patients: BloggerCourse.com  Fact Sheet for Healthcare Providers: SeriousBroker.it  This test is not yet approved or cleared  by the [place] and has been authorized for detection and/or diagnosis of SARS-CoV-2 by FDA under an Emergency Use Authorization (EUA). This EUA will remain in effect  (meaning this test can be used) for the duration of the COVID-19 declaration under Section 564(b)(1) of the Act, 21 U.S.C. section 360bbb-3(b)(1), unless the authorization is terminated or revoked.     Resp Syncytial Virus by PCR NEGATIVE NEGATIVE    Comment: (NOTE) Fact Sheet for Patients: BloggerCourse.com  Fact Sheet for Healthcare Providers: SeriousBroker.it  This test is not yet approved or cleared by the [place] FDA and has been authorized for detection and/or diagnosis of SARS-CoV-2 by FDA under an Emergency Use Authorization (EUA). This EUA will remain in effect (meaning this test can be used) for the duration of the COVID-19 declaration under Section 564(b)(1) of the Act, 21 U.S.C. section 360bbb-3(b)(1), unless the authorization is terminated or revoked.  Performed at [facility], [address], [place], [place] [address]    *Note: Due to a large number of results and/or encounters for the requested time period, some results have not been displayed. A complete set of results can be found in Results Review.   DG Chest 2 View  Result Date: 08/29/2023 CLINICAL DATA:  Shortness of breath and weight gain. On home oxygen. EXAM: CHEST - 2 VIEW COMPARISON:  08/13/2023 FINDINGS: Midline trachea. Moderate to marked cardiomegaly again with a configuration which can be seen with pericardial effusion. Small left and trace right pleural effusions, chronic. No pneumothorax. Interstitial edema is mild, improved. Improved bibasilar airspace disease. Surgical clips project over the left axilla. IVC filter. IMPRESSION: Cardiomegaly with improved mild congestive heart failure. Small, left-greater-than-right pleural effusions are similar. Adjacent presumed atelectasis at the lung bases is improved. Electronically Signed   By: Jeronimo Greaves M.D.   On: 08/29/2023 17:59    Pending Labs Unresulted Labs (From admission,  onward)     Start     Ordered   08/30/23 0500  Basic metabolic panel  Daily,   R      08/29/23 2146   08/30/23 0500  CBC  Daily,   R      08/29/23 2146            Vitals/Pain Today's Vitals   08/29/23 1913 08/29/23 2100 08/29/23 2106 08/29/23 2107  BP: (!) 113/49 (!) 169/49 (!) 147/56   Pulse: 75   70  Resp: 16 15 18 15   Temp: 97.7 F (36.5 C)     TempSrc: Oral     SpO2: 100%   99%  Weight:      Height:      PainSc:        Isolation Precautions No active isolations  Medications Medications  spironolactone (ALDACTONE) tablet 12.5 mg (has no administration in time range)  albuterol (VENTOLIN HFA) 108 (90 Base) MCG/ACT inhaler 2 puff (has no administration in time range)  montelukast (SINGULAIR) tablet 10 mg (has no administration in time range)  heparin injection 5,000 Units (has no administration in time range)  furosemide (LASIX) injection 40 mg (has no administration in time range)  sodium chloride flush (NS) 0.9 % injection 3 mL (has no administration in time range)  acetaminophen (TYLENOL) tablet 650 mg (has no administration in time range)    Or  acetaminophen (TYLENOL) suppository 650 mg (has no administration in time range)  senna-docusate (Senokot-S) tablet 1 tablet (has no administration in time range)  ondansetron (ZOFRAN) tablet 4 mg (has no  administration in time range)    Or  ondansetron (ZOFRAN) injection 4 mg (has no administration in time range)  furosemide (LASIX) injection 40 mg (40 mg Intravenous Given 08/29/23 2129)    Mobility walks with device

## 2023-08-29 NOTE — Telephone Encounter (Addendum)
Pt recently admitted w/ CHF.  Wt went up 3 lbs overnight.  Pt a little SOB, describes orthopnea, on O2 @ 3lpm.  She normally takes Lasix 20 mg x 3.   Has OTC potassium, not taking it.   Take 4 Lasix tabs this am and a K+  She is to call back this pm, if wt not down, may need to take 80 mg again this pm w/ a K+ tab.   Pt prefers home management if at all possible because husband cannot stay alone overnight.  Advised her that if sx no better this pm, may need ER.  Theodore Demark, PA-C 08/29/2023 9:10 AM  Ms. Steptoe called back.  She had taken the Lasix 80 mg this a.m. as requested.  She had urinated some, but did not feel like it was a great deal more.  She is not over drinking.  She has kept her legs up today, and the swelling is a little better.  Her weight is the same.  She is short of breath with minimal exertion and does not feel like doing much.  I told her that I felt the oral Lasix was not doing much and she needed to come in and get IV Lasix and be evaluated.  I also said that she should call 9 1 to get here.  She thought she might get someone to bring her, and thought she might come in, but did not commit to either one.  Theodore Demark, PA-C 08/29/2023 2:43 PM

## 2023-08-29 NOTE — ED Provider Notes (Signed)
[facility] AT [facility] Provider Note   CSN: [id number] Arrival date & time: 08/29/23  1531     History  Chief Complaint  Patient presents with   Shortness of Breath    Paula Ward is a 87 y.o. female.  Pt is a 87 yo female with pmhx significant for anemia, PE, GERD, angiodysplasia of stomach and duodenum, scleroderma, cka, breast cancer, colon cancer, and chf.  Pt was admitted from 10/24-26 for CHF and diuresed well.  Pt said she had been doing ok until today when she had more sob.  She had a 3 lb weight gain overnight.  She can't lay flat.  She took extra lasix, but only urinated once and is still sob.  She feels very swollen.  Pt does wear 3L oxygen chronically.       Home Medications Prior to Admission medications   Medication Sig Start Date End Date Taking? Authorizing Provider  acetaminophen (TYLENOL) 325 MG tablet Take 2 tablets (650 mg total) by mouth every 6 (six) hours as needed for moderate pain. 07/18/22   Wallis Bamberg, PA-C  albuterol (VENTOLIN HFA) 108 (90 Base) MCG/ACT inhaler INHALE 2 PUFFS INTO THE LUNGS EVERY 6 HOURS AS NEEDED FOR WHEEZING OR SHORTNESS OF BREATH Patient taking differently: Inhale 2 puffs into the lungs every 6 (six) hours as needed for wheezing or shortness of breath. 03/13/23   [name], MD  augmented betamethasone dipropionate (DIPROLENE-AF) 0.05 % cream Apply 1 application  topically 2 (two) times daily as needed (for irritation). 07/13/23   [provider]  clobetasol ointment (TEMOVATE) 0.05 % Apply 1 Application topically 2 (two) times daily as needed (for irritation). 04/20/23   [provider]  docusate sodium (COLACE) 100 MG capsule Take 1 capsule (100 mg total) by mouth 2 (two) times daily. Patient taking differently: Take 100 mg by mouth in the morning. 10/15/22   Edmisten, Kristie L, PA  empagliflozin (JARDIANCE) 10 MG TABS tablet Take 1 tablet (10 mg total) by mouth daily.  08/16/23   Lewie Chamber, MD  famotidine (PEPCID) 20 MG tablet Take 1 tablet (20 mg total) by mouth at bedtime. 02/10/23   Napoleon Form, MD  fluticasone (FLONASE) 50 MCG/ACT nasal spray USE 1 SPRAY(S) IN EACH NOSTRIL ONCE DAILY AS NEEDED FOR ALLERGIES Patient taking differently: Place 1 spray into both nostrils in the morning. 08/11/23   [name], MD  furosemide (LASIX) 20 MG tablet Take 3 tablets (60 mg total) by mouth daily. 08/21/23   Ronney Asters, NP  guaiFENesin (MUCINEX) 600 MG 12 hr tablet Take 300 mg by mouth daily.    [provider]  ipratropium (ATROVENT) 0.03 % nasal spray Place 2 sprays into both nostrils every 12 (twelve) hours. Patient taking differently: Place 2 sprays into both nostrils at bedtime. 05/12/22   Martina Sinner, MD  latanoprost (XALATAN) 0.005 % ophthalmic solution Place 1 drop into both eyes at bedtime. 09/23/22   [provider]  magic mouthwash SOLN Take 10 mLs by mouth 4 (four) times daily as needed for mouth pain. 03/22/22   Burnadette Pop, MD  mirtazapine (REMERON) 15 MG tablet Take 1 tablet (15 mg total) by mouth at bedtime. Patient not taking: Reported on 08/21/2023 03/13/23   [name], MD  montelukast (SINGULAIR) 10 MG tablet TAKE 1 TABLET BY MOUTH AT BEDTIME Patient taking differently: Take 10 mg by mouth at bedtime. TAKE 1 TABLET BY MOUTH AT BEDTIME  07/31/23   [name], MD  mupirocin ointment (BACTROBAN) 2 % Apply 1 Application topically 2 (two) times daily as needed (for irritation).    [provider]  Nutritional Supplements (ENSURE HIGH PROTEIN) LIQD Take 0.5 Bottles by mouth daily as needed (for supplementation).    [provider]  OPSUMIT 10 MG tablet Take 1 tablet (10 mg total) by mouth daily. 07/07/23   Parrett, Virgel Bouquet, NP  OXYGEN Inhale 2-3 L/min into the lungs continuous.    [provider]  pantoprazole (PROTONIX) 40 MG tablet Take 1 tablet (40 mg total)  by mouth daily. Patient taking differently: Take 40 mg by mouth daily as needed (for acid reflux). 02/10/23   Napoleon Form, MD  polyethylene glycol (MIRALAX / GLYCOLAX) 17 g packet Take 17 g by mouth daily. Patient not taking: Reported on 08/21/2023 10/15/22   Derenda Fennel, PA  spironolactone (ALDACTONE) 25 MG tablet Take 0.5 tablets (12.5 mg total) by mouth daily. 08/15/23   Lewie Chamber, MD  sucralfate (CARAFATE) 1 GM/10ML suspension TAKE 10 MLS BY MOUTH EVERY 6 HOURS AS NEEDED FOR REFLUX Patient taking differently: Take 1 g by mouth every 6 (six) hours as needed (to coat the stomach). 02/11/23   Napoleon Form, MD  triamcinolone cream (KENALOG) 0.1 % Apply 1 Application topically See admin instructions. Apply to affected leg(s) after showers and up to 2 additional times a day as needed for irritation 07/07/23   [provider]      Allergies    Codeine, Other, Erythromycin, Gluten meal, Iodinated contrast media, Lisinopril, Mirtazapine, Mycophenolate mofetil, and Sulfa antibiotics    Review of Systems   Review of Systems  Respiratory:  Positive for shortness of breath.   Cardiovascular:  Positive for leg swelling.  All other systems reviewed and are negative.   Physical Exam Updated Vital Signs BP (!) 147/56   Pulse 70   Temp 97.7 F (36.5 C) (Oral)   Resp 15   Ht 5\' 3"  (1.6 m)   Wt 49.9 kg   SpO2 99%   BMI 19.49 kg/m  Physical Exam Vitals and nursing note reviewed.  Constitutional:      Appearance: She is well-developed.  HENT:     Head: Normocephalic and atraumatic.     Mouth/Throat:     Mouth: Mucous membranes are moist.     Pharynx: Oropharynx is clear.  Eyes:     Extraocular Movements: Extraocular movements intact.     Pupils: Pupils are equal, round, and reactive to light.  Cardiovascular:     Rate and Rhythm: Normal rate and regular rhythm.  Pulmonary:     Effort: Pulmonary effort is normal.     Breath sounds: Normal breath sounds.   Abdominal:     General: Bowel sounds are normal.     Palpations: Abdomen is soft.  Musculoskeletal:     Cervical back: Normal range of motion and neck supple.     Right lower leg: Edema present.     Left lower leg: Edema present.  Skin:    General: Skin is warm and dry.     Capillary Refill: Capillary refill takes less than 2 seconds.  Neurological:     General: No focal deficit present.     Mental Status: She is alert and oriented to person, place, and time.  Psychiatric:        Mood and Affect: Mood normal.        Behavior: Behavior normal.  ED Results / Procedures / Treatments   Labs (all labs ordered are listed, but only abnormal results are displayed) Labs Reviewed  BRAIN NATRIURETIC PEPTIDE - Abnormal; Notable for the following components:      Result Value   B Natriuretic Peptide 2,168.5 (*)    All other components within normal limits  CBC WITH DIFFERENTIAL/PLATELET - Abnormal; Notable for the following components:   RBC 2.38 (*)    Hemoglobin 8.5 (*)    HCT 26.5 (*)    MCV 111.3 (*)    MCH 35.7 (*)    All other components within normal limits  COMPREHENSIVE METABOLIC PANEL - Abnormal; Notable for the following components:   Sodium 130 (*)    Chloride 91 (*)    BUN 49 (*)    Creatinine, Ser 1.33 (*)    Calcium 8.4 (*)    GFR, Estimated 39 (*)    All other components within normal limits  TROPONIN I (HIGH SENSITIVITY) - Abnormal; Notable for the following components:   Troponin I (High Sensitivity) 22 (*)    All other components within normal limits  RESP PANEL BY RT-PCR (RSV, FLU A&B, COVID)  RVPGX2  MAGNESIUM  TROPONIN I (HIGH SENSITIVITY)    EKG EKG Interpretation Date/Time:  Saturday August 29 2023 16:24:53 EST Ventricular Rate:  74 PR Interval:  227 QRS Duration:  92 QT Interval:  405 QTC Calculation: 450 R Axis:   -28  Text Interpretation: Sinus rhythm Prolonged PR interval Low voltage, extremity and precordial leads Consider anterior  infarct Borderline repolarization abnormality No significant change since last tracing Confirmed by Linwood Dibbles [phone]) on 08/29/2023 4:30:46 PM  Radiology DG Chest 2 View  Result Date: 08/29/2023 CLINICAL DATA:  Shortness of breath and weight gain. On home oxygen. EXAM: CHEST - 2 VIEW COMPARISON:  08/13/2023 FINDINGS: Midline trachea. Moderate to marked cardiomegaly again with a configuration which can be seen with pericardial effusion. Small left and trace right pleural effusions, chronic. No pneumothorax. Interstitial edema is mild, improved. Improved bibasilar airspace disease. Surgical clips project over the left axilla. IVC filter. IMPRESSION: Cardiomegaly with improved mild congestive heart failure. Small, left-greater-than-right pleural effusions are similar. Adjacent presumed atelectasis at the lung bases is improved. Electronically Signed   By: Jeronimo Greaves M.D.   On: 08/29/2023 17:59    Procedures Procedures    Medications Ordered in ED Medications  furosemide (LASIX) injection 40 mg (40 mg Intravenous Given 08/29/23 2129)    ED Course/ Medical Decision Making/ A&P                                 Medical Decision Making Amount and/or Complexity of Data Reviewed Labs: ordered. Radiology: ordered.  Risk Prescription drug management. Decision regarding hospitalization.   This patient presents to the ED for concern of sob, this involves an extensive number of treatment options, and is a complaint that carries with it a high risk of complications and morbidity.  The differential diagnosis includes chf exac, copd   Co morbidities that complicate the patient evaluation  anemia, PE, GERD, angiodysplasia of stomach and duodenum, scleroderma, cka, breast cancer, colon cancer, and chf   Additional history obtained:  Additional history obtained from epic chart review  Lab Tests:  I Ordered, and personally interpreted labs.  The pertinent results include:  cbc with hgb 8.5 (sl  worse than prior hgb 9.1 on 11/4); cmp with bun 49 and cr  1.33 (gfr 39) which has been stable; covid/flu/rsv neg, trop 22, mg nl at 2.3, bnp 2168.5   Imaging Studies ordered:  I ordered imaging studies including cxr  I independently visualized and interpreted imaging which showed  Cardiomegaly with improved mild congestive heart failure.    Small, left-greater-than-right pleural effusions are similar.  Adjacent presumed atelectasis at the lung bases is improved.   I agree with the radiologist interpretation   Cardiac Monitoring:  The patient was maintained on a cardiac monitor.  I personally viewed and interpreted the cardiac monitored which showed an underlying rhythm of: nsr   Medicines ordered and prescription drug management:  I ordered medication including lasix  for chf  Reevaluation of the patient after these medicines showed that the patient improved I have reviewed the patients home medicines and have made adjustments as needed   Critical Interventions:  Iv lasix   Consultations Obtained:  I requested consultation with the hospitalist (Dr. Antionette Char),  and discussed lab and imaging findings as well as pertinent plan - he will admit   Problem List / ED Course:  CHF exac:  IV lasix given.  Pt has been trying oral tx and it has not been working.  She will benefit from IV diuresis.   Reevaluation:  After the interventions noted above, I reevaluated the patient and found that they have :improved   Social Determinants of Health:  Lives at home   Dispostion:  After consideration of the diagnostic results and the patients response to treatment, I feel that the patent would benefit from admission.          Final Clinical Impression(s) / ED Diagnoses Final diagnoses:  Acute on chronic congestive heart failure, unspecified heart failure type [facility])    Rx / DC Orders ED Discharge Orders     None         Jacalyn Lefevre, MD 08/29/23 2141

## 2023-08-29 NOTE — H&P (Signed)
History and Physical    [name] [name]:[id number] DOB: 1936/07/28 DOA: 08/29/2023  PCP: [name], MD   Patient coming from: Home   Chief Complaint: SOB, weight gain, orthopnea   HPI: Paula Ward is a 87 y.o. female with medical history significant for hypertension, CKD 3B, interstitial lung disease, pulmonary hypertension, chronic hypoxic respiratory failure, chronic anemia, and chronic diastolic CHF who presents with shortness of breath, weight gain, and orthopnea.  Patient reports progressive weight gain and orthopnea since the recent hospital discharge despite adherence with her medications.  She reports gaining another 3 pounds overnight last night, took extra furosemide today, but did not urinate much, called her cardiology office, and was advised by cardiology PA to proceed to the ED.  She denies chest pain, fever, or chills.  ED Course: Upon arrival to the ED, patient is found to be afebrile and saturating well on 3 L/min of supplemental oxygen with normal heart rate and stable blood pressure.  EKG demonstrates sinus rhythm with first-degree AV nodal block.  Chest x-ray is notable for cardiomegaly, mild CHF, and pleural effusions.  Labs are most notable for sodium 130, creatinine 1.33, negative respiratory virus panel, and hemoglobin 8.5.  Patient was treated with 40 mg IV Lasix in the ED.  Review of Systems:  All other systems reviewed and apart from HPI, are negative.  Past Medical History:  Diagnosis Date   Anemia    Angiodysplasia of stomach and duodenum    Arthus phenomenon    AVM (arteriovenous malformation)    Blood transfusion without reported diagnosis    last 4 units 12-22-15, Iron infusion x2 last -01-07-16,01-14-16.   Breast cancer (HCC) 1989   Left   Candida esophagitis (HCC)    Cataract    Chronic kidney disease    Chronic mild renal insuffiency   Corneal edema    Corneal epithelial basement membrane dystrophy    CREST syndrome (HCC)     GERD (gastroesophageal reflux disease)    w/ HH   Interstitial lung disease (HCC)    Nodule of kidney    Pericardial effusion    PONV (postoperative nausea and vomiting)    Pulmonary embolus (HCC) 2003   Pulmonary hypertension (HCC)    followed by Dr Bland Span at [facility], now Dr. Penni Bombard visit end 2'17.   Rectal incontinence    Renal cell carcinoma (HCC)    Scleroderma (HCC)    Tubular adenoma of colon    Uterine polyp     Past Surgical History:  Procedure Laterality Date   APPENDECTOMY  1962   BREAST LUMPECTOMY  1989   left   CATARACT EXTRACTION, BILATERAL Bilateral 12/2013   COLONOSCOPY WITH PROPOFOL N/A 04/15/2021   Procedure: COLONOSCOPY WITH PROPOFOL;  Surgeon: Rachael Fee, MD;  Location: [facility];  Service: Endoscopy;  Laterality: N/A;   ENTEROSCOPY N/A 01/18/2016   Procedure: ENTEROSCOPY;  Surgeon: Napoleon Form, MD;  Location: [facility];  Service: Endoscopy;  Laterality: N/A;   ENTEROSCOPY N/A 05/24/2018   Procedure: ENTEROSCOPY;  Surgeon: Lynann Bologna, MD;  Location: [facility];  Service: Endoscopy;  Laterality: N/A;   ENTEROSCOPY N/A 03/29/2021   Procedure: ENTEROSCOPY;  Surgeon: Beverley Fiedler, MD;  Location: [facility];  Service: Gastroenterology;  Laterality: N/A;   ENTEROSCOPY N/A 04/13/2021   Procedure: ENTEROSCOPY;  Surgeon: Benancio Deeds, MD;  Location: [facility];  Service: Gastroenterology;  Laterality: N/A;   ESOPHAGOGASTRODUODENOSCOPY (EGD) WITH PROPOFOL N/A 12/21/2015   Procedure: ESOPHAGOGASTRODUODENOSCOPY (EGD)  WITH PROPOFOL;  Surgeon: Hilarie Fredrickson, MD;  Location: [facility];  Service: Endoscopy;  Laterality: N/A;   HOT HEMOSTASIS N/A 05/24/2018   Procedure: HOT HEMOSTASIS (ARGON PLASMA COAGULATION/BICAP);  Surgeon: Lynann Bologna, MD;  Location: [facility];  Service: Endoscopy;  Laterality: N/A;   HOT HEMOSTASIS N/A 03/29/2021   Procedure: HOT HEMOSTASIS (ARGON PLASMA COAGULATION/BICAP);  Surgeon: Beverley Fiedler, MD;  Location: [facility];  Service: Gastroenterology;  Laterality: N/A;   HOT HEMOSTASIS N/A 04/13/2021   Procedure: HOT HEMOSTASIS (ARGON PLASMA COAGULATION/BICAP);  Surgeon: Benancio Deeds, MD;  Location: [facility];  Service: Gastroenterology;  Laterality: N/A;   INTRAMEDULLARY (IM) NAIL INTERTROCHANTERIC Left 10/11/2022   Procedure: INTRAMEDULLARY (IM) NAIL INTERTROCHANTERIC;  Surgeon: Ollen Gross, MD;  Location: [facility];  Service: Orthopedics;  Laterality: Left;   IR GENERIC HISTORICAL  06/05/2016   IR RADIOLOGIST EVAL & MGMT 06/05/2016 [name], MD [facility] INTERV RAD   IVC Filter     KIDNEY SURGERY     left -"laser surgery by Dr. Fredia Sorrow- 5 yrs ago" no removal   RIGHT/LEFT HEART CATH AND CORONARY ANGIOGRAPHY N/A 04/25/2022   Procedure: RIGHT/LEFT HEART CATH AND CORONARY ANGIOGRAPHY;  Surgeon: Tonny Bollman, MD;  Location: [facility] INVASIVE CV LAB;  Service: Cardiovascular;  Laterality: N/A;   SCHLEROTHERAPY  05/24/2018   Procedure: Theresia Majors;  Surgeon: Lynann Bologna, MD;  Location: [facility];  Service: Endoscopy;;   SUBMUCOSAL TATTOO INJECTION  04/13/2021   Procedure: SUBMUCOSAL TATTOO INJECTION;  Surgeon: Benancio Deeds, MD;  Location: [facility];  Service: Gastroenterology;;   TONSILLECTOMY     TUBAL LIGATION      Social History:   reports that she has never smoked. She has never used smokeless tobacco. She reports that she does not drink alcohol and does not use drugs.  Allergies  Allergen Reactions   Codeine Nausea Only    Hallucinations, too   Other Nausea And Vomiting and Other (See Comments)    "-mycin" antibiotics.   Also cause hallucinations.   Erythromycin Nausea And Vomiting   Gluten Meal Diarrhea   Iodinated Contrast Media Other (See Comments)    Renal issues   Lisinopril     Pt doesn't remember reaction   Mirtazapine Other (See Comments)    "Threw me into orbit"   Mycophenolate Mofetil Nausea Only   Sulfa Antibiotics Other (See Comments)    Unknown  reaction    Family History  Problem Relation Age of Onset   Bladder Cancer Father    Diabetes Father    Prostate cancer Father    Alzheimer's disease Mother    Diabetes Sister    Lung cancer Sister         smoker   Esophageal cancer Paternal Uncle    Colon cancer Neg Hx      Prior to Admission medications   Medication Sig Start Date End Date Taking? Authorizing Provider  acetaminophen (TYLENOL) 325 MG tablet Take 2 tablets (650 mg total) by mouth every 6 (six) hours as needed for moderate pain. 07/18/22   Wallis Bamberg, PA-C  albuterol (VENTOLIN HFA) 108 (90 Base) MCG/ACT inhaler INHALE 2 PUFFS INTO THE LUNGS EVERY 6 HOURS AS NEEDED FOR WHEEZING OR SHORTNESS OF BREATH Patient taking differently: Inhale 2 puffs into the lungs every 6 (six) hours as needed for wheezing or shortness of breath. 03/13/23   [name], MD  augmented betamethasone dipropionate (DIPROLENE-AF) 0.05 % cream Apply 1 application  topically 2 (two) times daily as needed (  for irritation). 07/13/23   [provider]  clobetasol ointment (TEMOVATE) 0.05 % Apply 1 Application topically 2 (two) times daily as needed (for irritation). 04/20/23   [provider]  docusate sodium (COLACE) 100 MG capsule Take 1 capsule (100 mg total) by mouth 2 (two) times daily. Patient taking differently: Take 100 mg by mouth in the morning. 10/15/22   Edmisten, Kristie L, PA  empagliflozin (JARDIANCE) 10 MG TABS tablet Take 1 tablet (10 mg total) by mouth daily. 08/16/23   Lewie Chamber, MD  famotidine (PEPCID) 20 MG tablet Take 1 tablet (20 mg total) by mouth at bedtime. 02/10/23   Napoleon Form, MD  fluticasone (FLONASE) 50 MCG/ACT nasal spray USE 1 SPRAY(S) IN EACH NOSTRIL ONCE DAILY AS NEEDED FOR ALLERGIES Patient taking differently: Place 1 spray into both nostrils in the morning. 08/11/23   [name], MD  furosemide (LASIX) 20 MG tablet Take 3 tablets (60 mg total) by mouth daily. 08/21/23    Ronney Asters, NP  guaiFENesin (MUCINEX) 600 MG 12 hr tablet Take 300 mg by mouth daily.    [provider]  ipratropium (ATROVENT) 0.03 % nasal spray Place 2 sprays into both nostrils every 12 (twelve) hours. Patient taking differently: Place 2 sprays into both nostrils at bedtime. 05/12/22   Martina Sinner, MD  latanoprost (XALATAN) 0.005 % ophthalmic solution Place 1 drop into both eyes at bedtime. 09/23/22   [provider]  magic mouthwash SOLN Take 10 mLs by mouth 4 (four) times daily as needed for mouth pain. 03/22/22   Burnadette Pop, MD  mirtazapine (REMERON) 15 MG tablet Take 1 tablet (15 mg total) by mouth at bedtime. Patient not taking: Reported on 08/21/2023 03/13/23   [name], MD  montelukast (SINGULAIR) 10 MG tablet TAKE 1 TABLET BY MOUTH AT BEDTIME Patient taking differently: Take 10 mg by mouth at bedtime. TAKE 1 TABLET BY MOUTH AT BEDTIME 07/31/23   [name], MD  mupirocin ointment (BACTROBAN) 2 % Apply 1 Application topically 2 (two) times daily as needed (for irritation).    [provider]  Nutritional Supplements (ENSURE HIGH PROTEIN) LIQD Take 0.5 Bottles by mouth daily as needed (for supplementation).    [provider]  OPSUMIT 10 MG tablet Take 1 tablet (10 mg total) by mouth daily. 07/07/23   Parrett, Virgel Bouquet, NP  OXYGEN Inhale 2-3 L/min into the lungs continuous.    [provider]  pantoprazole (PROTONIX) 40 MG tablet Take 1 tablet (40 mg total) by mouth daily. Patient taking differently: Take 40 mg by mouth daily as needed (for acid reflux). 02/10/23   Napoleon Form, MD  polyethylene glycol (MIRALAX / GLYCOLAX) 17 g packet Take 17 g by mouth daily. Patient not taking: Reported on 08/21/2023 10/15/22   Derenda Fennel, PA  spironolactone (ALDACTONE) 25 MG tablet Take 0.5 tablets (12.5 mg total) by mouth daily. 08/15/23   Lewie Chamber, MD  sucralfate (CARAFATE) 1 GM/10ML suspension TAKE  10 MLS BY MOUTH EVERY 6 HOURS AS NEEDED FOR REFLUX Patient taking differently: Take 1 g by mouth every 6 (six) hours as needed (to coat the stomach). 02/11/23   Napoleon Form, MD  triamcinolone cream (KENALOG) 0.1 % Apply 1 Application topically See admin instructions. Apply to affected leg(s) after showers and up to 2 additional times a day as needed for irritation 07/07/23   [provider]    Physical Exam: Vitals:   08/29/23 1913  08/29/23 2100 08/29/23 2106 08/29/23 2107  BP: (!) 113/49 (!) 169/49 (!) 147/56   Pulse: 75   70  Resp: 16 15 18 15   Temp: 97.7 F (36.5 C)     TempSrc: Oral     SpO2: 100%   99%  Weight:      Height:        Constitutional: NAD, calm  Eyes: PERTLA, lids and conjunctivae normal ENMT: Mucous membranes are moist. Posterior pharynx clear of any exudate or lesions.   Neck: supple, no masses  Respiratory: no wheezing, no crackles. No accessory muscle use.  Cardiovascular: S1 & S2 heard, regular rate and rhythm. Pretibial pitting edema b/l.   Abdomen: No distension, no tenderness, soft. Bowel sounds active.  Musculoskeletal: no clubbing / cyanosis. No joint deformity upper and lower extremities.   Skin: no significant rashes, lesions, ulcers. Warm, dry, well-perfused. Neurologic: CN 2-12 grossly intact. Moving all extremities. Alert and oriented.  Psychiatric: Pleasant. Cooperative.    Labs and Imaging on Admission: I have personally reviewed following labs and imaging studies  CBC: Recent Labs  Lab 08/24/23 1112 08/29/23 1613  WBC 5.2 6.7  NEUTROABS 3.6 5.0  HGB 9.1* 8.5*  HCT 28.5* 26.5*  MCV 110.9* 111.3*  PLT 179 157   Basic Metabolic Panel: Recent Labs  Lab 08/24/23 1112 08/29/23 1613  NA 133* 130*  K 4.3 4.7  CL 92* 91*  CO2 37* 31  GLUCOSE 92 95  BUN 47* 49*  CREATININE 1.34* 1.33*  CALCIUM 9.4 8.4*  MG  --  2.3   GFR: Estimated Creatinine Clearance: 24.4 mL/min (A) (by C-G formula based on SCr of 1.33 mg/dL  (H)). Liver Function Tests: Recent Labs  Lab 08/24/23 1112 08/29/23 1613  AST 23 27  ALT 10 13  ALKPHOS 66 54  BILITOT 0.7 1.0  PROT 6.8 6.8  ALBUMIN 4.1 3.9   No results for input(s): "LIPASE", "AMYLASE" in the last 168 hours. No results for input(s): "AMMONIA" in the last 168 hours. Coagulation Profile: No results for input(s): "INR", "PROTIME" in the last 168 hours. Cardiac Enzymes: No results for input(s): "CKTOTAL", "CKMB", "CKMBINDEX", "TROPONINI" in the last 168 hours. BNP (last 3 results) Recent Labs    05/13/23 1507 06/03/23 1200  PROBNP 1,750.0* 2,680.0*   HbA1C: No results for input(s): "HGBA1C" in the last 72 hours. CBG: No results for input(s): "GLUCAP" in the last 168 hours. Lipid Profile: No results for input(s): "CHOL", "HDL", "LDLCALC", "TRIG", "CHOLHDL", "LDLDIRECT" in the last 72 hours. Thyroid Function Tests: No results for input(s): "TSH", "T4TOTAL", "FREET4", "T3FREE", "THYROIDAB" in the last 72 hours. Anemia Panel: No results for input(s): "VITAMINB12", "FOLATE", "FERRITIN", "TIBC", "IRON", "RETICCTPCT" in the last 72 hours. Urine analysis:    Component Value Date/Time   COLORURINE YELLOW 03/13/2023 1512   APPEARANCEUR CLEAR 03/13/2023 1512   LABSPEC 1.020 03/13/2023 1512   PHURINE 5.5 03/13/2023 1512   GLUCOSEU NEGATIVE 03/13/2023 1512   HGBUR NEGATIVE 03/13/2023 1512   BILIRUBINUR neg 08/03/2023 1351   KETONESUR negative 04/03/2023 1532   KETONESUR TRACE (A) 03/13/2023 1512   PROTEINUR Negative 08/03/2023 1351   PROTEINUR NEGATIVE 03/30/2021 1416   UROBILINOGEN negative (A) 08/03/2023 1351   UROBILINOGEN 1.0 03/13/2023 1512   NITRITE neg 08/03/2023 1351   NITRITE NEGATIVE 03/13/2023 1512   LEUKOCYTESUR Negative 08/03/2023 1351   LEUKOCYTESUR NEGATIVE 03/13/2023 1512   Sepsis Labs: @LABRCNTIP (procalcitonin:4,lacticidven:4) ) Recent Results (from the past 240 hour(s))  Resp panel by RT-PCR (RSV, Flu A&B, Covid)  Anterior Nasal Swab      Status: None   Collection Time: 08/29/23  4:25 PM   Specimen: Anterior Nasal Swab  Result Value Ref Range Status   SARS Coronavirus 2 by RT PCR NEGATIVE NEGATIVE Final    Comment: (NOTE) SARS-CoV-2 target nucleic acids are NOT DETECTED.  The SARS-CoV-2 RNA is generally detectable in upper respiratory specimens during the acute phase of infection. The lowest concentration of SARS-CoV-2 viral copies this assay can detect is 138 copies/mL. A negative result does not preclude SARS-Cov-2 infection and should not be used as the sole basis for treatment or other patient management decisions. A negative result may occur with  improper specimen collection/handling, submission of specimen other than nasopharyngeal swab, presence of viral mutation(s) within the areas targeted by this assay, and inadequate number of viral copies(<138 copies/mL). A negative result must be combined with clinical observations, patient history, and epidemiological information. The expected result is Negative.  Fact Sheet for Patients:  BloggerCourse.com  Fact Sheet for Healthcare Providers:  SeriousBroker.it  This test is no t yet approved or cleared by the [place] FDA and  has been authorized for detection and/or diagnosis of SARS-CoV-2 by FDA under an Emergency Use Authorization (EUA). This EUA will remain  in effect (meaning this test can be used) for the duration of the COVID-19 declaration under Section 564(b)(1) of the Act, 21 U.S.C.section 360bbb-3(b)(1), unless the authorization is terminated  or revoked sooner.       Influenza A by PCR NEGATIVE NEGATIVE Final   Influenza B by PCR NEGATIVE NEGATIVE Final    Comment: (NOTE) The Xpert Xpress SARS-CoV-2/FLU/RSV plus assay is intended as an aid in the diagnosis of influenza from Nasopharyngeal swab specimens and should not be used as a sole basis for treatment. Nasal washings and aspirates are  unacceptable for Xpert Xpress SARS-CoV-2/FLU/RSV testing.  Fact Sheet for Patients: BloggerCourse.com  Fact Sheet for Healthcare Providers: SeriousBroker.it  This test is not yet approved or cleared by the [place] FDA and has been authorized for detection and/or diagnosis of SARS-CoV-2 by FDA under an Emergency Use Authorization (EUA). This EUA will remain in effect (meaning this test can be used) for the duration of the COVID-19 declaration under Section 564(b)(1) of the Act, 21 U.S.C. section 360bbb-3(b)(1), unless the authorization is terminated or revoked.     Resp Syncytial Virus by PCR NEGATIVE NEGATIVE Final    Comment: (NOTE) Fact Sheet for Patients: BloggerCourse.com  Fact Sheet for Healthcare Providers: SeriousBroker.it  This test is not yet approved or cleared by the [place] FDA and has been authorized for detection and/or diagnosis of SARS-CoV-2 by FDA under an Emergency Use Authorization (EUA). This EUA will remain in effect (meaning this test can be used) for the duration of the COVID-19 declaration under Section 564(b)(1) of the Act, 21 U.S.C. section 360bbb-3(b)(1), unless the authorization is terminated or revoked.  Performed at [facility], [address] [place], [place] [address]      Radiological Exams on Admission: DG Chest 2 View  Result Date: 08/29/2023 CLINICAL DATA:  Shortness of breath and weight gain. On home oxygen. EXAM: CHEST - 2 VIEW COMPARISON:  08/13/2023 FINDINGS: Midline trachea. Moderate to marked cardiomegaly again with a configuration which can be seen with pericardial effusion. Small left and trace right pleural effusions, chronic. No pneumothorax. Interstitial edema is mild, improved. Improved bibasilar airspace disease. Surgical clips project over the left axilla. IVC filter. IMPRESSION: Cardiomegaly with  improved mild  congestive heart failure. Small, left-greater-than-right pleural effusions are similar. Adjacent presumed atelectasis at the lung bases is improved. Electronically Signed   By: Jeronimo Greaves M.D.   On: 08/29/2023 17:59    EKG: Independently reviewed. Sinus rhythm, 1st degree AV block.   Assessment/Plan   1. Acute on chronic diastolic CHF  - EF was >75% with grade 1 diastolic dysfunction, severe LAE, large pericardial effusion, and mild AS on TTE from October 2024 - Continue diuresis with 40 mg IV Lasix q12h, monitor weight and I/Os, monitor renal function and electrolytes    2. Hyponatremia  - Serum sodium is 130 in setting of hypervolemia  - Follow daily chem panel while diuresing    3. CKD 3B  - Appears close to baseline  - Renally-dose medications, follow daily chem panel while diuresing  4. Anemia  - No overt bleeding, continue hematology follow-up after discharge as planned    5. Chronic hypoxic respiratory failure  - Stable, continue supplemental O2     DVT prophylaxis: sq heparin  Code Status: Full  Level of Care: Level of care: Telemetry Family Communication: None present  Disposition Plan:  Patient is from: Home  Anticipated d/c is to: TBD Anticipated d/c date is: 09/01/23  Patient currently: Pending improved volume status, stable electrolytes  Consults called: None  Admission status: Inpatient     Briscoe Deutscher, MD [facility]  08/29/2023, 9:47 PM

## 2023-08-30 DIAGNOSIS — I5033 Acute on chronic diastolic (congestive) heart failure: Secondary | ICD-10-CM | POA: Diagnosis not present

## 2023-08-30 LAB — BASIC METABOLIC PANEL
Anion gap: 9 (ref 5–15)
BUN: 50 mg/dL — ABNORMAL HIGH (ref 8–23)
CO2: 31 mmol/L (ref 22–32)
Calcium: 8.7 mg/dL — ABNORMAL LOW (ref 8.9–10.3)
Chloride: 91 mmol/L — ABNORMAL LOW (ref 98–111)
Creatinine, Ser: 1.06 mg/dL — ABNORMAL HIGH (ref 0.44–1.00)
GFR, Estimated: 51 mL/min — ABNORMAL LOW (ref >60)
Glucose, Bld: 99 mg/dL (ref 70–99)
Potassium: 3.9 mmol/L (ref 3.5–5.1)
Sodium: 131 mmol/L — ABNORMAL LOW (ref 135–145)

## 2023-08-30 LAB — CBC
HCT: 26.7 % — ABNORMAL LOW (ref 36.0–46.0)
Hemoglobin: 8.3 g/dL — ABNORMAL LOW (ref 12.0–15.0)
MCH: 35 pg — ABNORMAL HIGH (ref 26.0–34.0)
MCHC: 31.1 g/dL (ref 30.0–36.0)
MCV: 112.7 fL — ABNORMAL HIGH (ref 80.0–100.0)
Platelets: 152 10*3/uL (ref 150–400)
RBC: 2.37 MIL/uL — ABNORMAL LOW (ref 3.87–5.11)
RDW: 13.2 % (ref 11.5–15.5)
WBC: 5.7 10*3/uL (ref 4.0–10.5)
nRBC: 0 % (ref 0.0–0.2)

## 2023-08-30 MED ORDER — FLUTICASONE PROPIONATE 50 MCG/ACT NA SUSP
1.0000 | Freq: Every morning | NASAL | Status: DC
Start: 2023-08-31 — End: 2023-09-03
  Administered 2023-08-31 – 2023-09-03 (×4): 1 via NASAL
  Filled 2023-08-30: qty 16

## 2023-08-30 MED ORDER — DOCUSATE SODIUM 100 MG PO CAPS
100.0000 mg | ORAL_CAPSULE | Freq: Two times a day (BID) | ORAL | Status: DC
  Administered 2023-08-30 – 2023-09-03 (×9): 100 mg via ORAL
  Filled 2023-08-30 (×10): qty 1

## 2023-08-30 MED ORDER — ENOXAPARIN SODIUM 30 MG/0.3ML IJ SOSY
30.0000 mg | PREFILLED_SYRINGE | Freq: Every day | INTRAMUSCULAR | Status: DC
  Administered 2023-08-30 – 2023-09-03 (×5): 30 mg via SUBCUTANEOUS
  Filled 2023-08-30 (×5): qty 0.3

## 2023-08-30 MED ORDER — FAMOTIDINE 20 MG PO TABS
20.0000 mg | ORAL_TABLET | Freq: Every day | ORAL | Status: DC
  Administered 2023-08-30 – 2023-09-01 (×3): 20 mg via ORAL
  Filled 2023-08-30 (×4): qty 1

## 2023-08-30 MED ORDER — SUCRALFATE 1 GM/10ML PO SUSP
1.0000 g | Freq: Four times a day (QID) | ORAL | Status: DC | PRN
  Administered 2023-08-31 – 2023-09-02 (×3): 1 g via ORAL
  Filled 2023-08-30 (×3): qty 10

## 2023-08-30 NOTE — Progress Notes (Addendum)
PROGRESS NOTE  Paula Ward QVZ:[id number] DOB: 1935/12/10 DOA: 08/29/2023 PCP: [name], MD  HPI/Recap of past 24 hours: Paula Ward is a 87 y.o. female with medical history significant for hypertension, CKD 3B, interstitial lung disease, pulmonary hypertension, chronic hypoxic respiratory failure on 3L of O2, chronic anemia, and chronic diastolic CHF who presents with shortness of breath, weight gain, and orthopnea for about 1 week. Due to this, patient called her cardiology office, and was advised to go to the ED. In the ED, patient is found to be afebrile and saturating well on 3 L of O2, other VSS. EKG demonstrates SR with first-degree AV nodal block.  Chest x-ray is notable for cardiomegaly, mild CHF, and pleural effusions.  Labs are most notable for sodium 130, creatinine 1.33, negative respiratory virus panel, and hemoglobin 8.5. Pt admitted for further management.    Today, pt denies any new complaints, still reporting SOB, but denies its worsening, denies any chest pain. Diuresing with IV lasix     Assessment/Plan: Principal Problem:   Acute on chronic diastolic (congestive) heart failure (HCC) Active Problems:   Chronic respiratory failure with hypoxia and hypercapnia (HCC)   Chronic kidney disease, stage 3b (HCC)   Macrocytic anemia   Essential hypertension   Hyponatremia   Acute on chronic diastolic CHF  Large pericardial effusion- currently not in tamponade BNP 2,168 around baseline, SOB ECHO from 07/2023: EF was >75% with grade 1DD, severe LAE, large pericardial effusion, and mild AS Continue diuresis with IV Lasix, continue aldactone Plan to consult cardiology in am Monitor weight and I/Os Daily BMP  Chronic hypoxic respiratory failure- on 3L of O2 at baseline  ILD Pulm HTN Continue supplemental O2, currently on 3L (baseline) On Opsumit for pulm HTN   Hyponatremia  Likely in setting of hypervolemia  Daily BMP   CKD 3B  Cr around  baseline Daily BMP   Normocytic/Anemia of chronic kidney disease Stable Daily CBC       Estimated body mass index is 18.21 kg/m as calculated from the following:   Height as of this encounter: 5\' 3"  (1.6 m).   Weight as of this encounter: 46.6 kg.     Code Status: Full  Family Communication: None at bedside   Disposition Plan: Status is: Inpatient Remains inpatient appropriate because: Level of care      Consultants: None  Procedures: None  Antimicrobials: None  DVT prophylaxis:  Lovenox   Objective: Vitals:   08/30/23 0315 08/30/23 0625 08/30/23 0700 08/30/23 1102  BP: (!) 114/53  (!) 134/51 122/60  Pulse: 82  83 82  Resp: 20  18 14   Temp: 97.9 F (36.6 C)  98.3 F (36.8 C) 98 F (36.7 C)  TempSrc: Oral  Oral Oral  SpO2: 98%  97% 97%  Weight:  46.6 kg    Height:        Intake/Output Summary (Last 24 hours) at 08/30/2023 1106 Last data filed at 08/30/2023 0800 Gross per 24 hour  Intake 980 ml  Output 950 ml  Net 30 ml   Filed Weights   08/29/23 1537 08/30/23 0625  Weight: 49.9 kg 46.6 kg    Exam: General: NAD  Cardiovascular: S1, S2 present Respiratory: Bibasilar crackles noted Abdomen: Soft, nontender, nondistended, bowel sounds present Musculoskeletal: Trace bilateral pedal edema noted Skin: Normal Psychiatry: Normal mood     Data Reviewed: CBC: Recent Labs  Lab 08/24/23 1112 08/29/23 1613 08/30/23 0410  WBC 5.2 6.7 5.7  NEUTROABS  3.6 5.0  --   HGB 9.1* 8.5* 8.3*  HCT 28.5* 26.5* 26.7*  MCV 110.9* 111.3* 112.7*  PLT 179 157 152   Basic Metabolic Panel: Recent Labs  Lab 08/24/23 1112 08/29/23 1613 08/30/23 0410  NA 133* 130* 131*  K 4.3 4.7 3.9  CL 92* 91* 91*  CO2 37* 31 31  GLUCOSE 92 95 99  BUN 47* 49* 50*  CREATININE 1.34* 1.33* 1.06*  CALCIUM 9.4 8.4* 8.7*  MG  --  2.3  --    GFR: Estimated Creatinine Clearance: 28.5 mL/min (A) (by C-G formula based on SCr of 1.06 mg/dL (H)). Liver Function  Tests: Recent Labs  Lab 08/24/23 1112 08/29/23 1613  AST 23 27  ALT 10 13  ALKPHOS 66 54  BILITOT 0.7 1.0  PROT 6.8 6.8  ALBUMIN 4.1 3.9   No results for input(s): "LIPASE", "AMYLASE" in the last 168 hours. No results for input(s): "AMMONIA" in the last 168 hours. Coagulation Profile: No results for input(s): "INR", "PROTIME" in the last 168 hours. Cardiac Enzymes: No results for input(s): "CKTOTAL", "CKMB", "CKMBINDEX", "TROPONINI" in the last 168 hours. BNP (last 3 results) Recent Labs    05/13/23 1507 06/03/23 1200  PROBNP 1,750.0* 2,680.0*   HbA1C: No results for input(s): "HGBA1C" in the last 72 hours. CBG: No results for input(s): "GLUCAP" in the last 168 hours. Lipid Profile: No results for input(s): "CHOL", "HDL", "LDLCALC", "TRIG", "CHOLHDL", "LDLDIRECT" in the last 72 hours. Thyroid Function Tests: No results for input(s): "TSH", "T4TOTAL", "FREET4", "T3FREE", "THYROIDAB" in the last 72 hours. Anemia Panel: No results for input(s): "VITAMINB12", "FOLATE", "FERRITIN", "TIBC", "IRON", "RETICCTPCT" in the last 72 hours. Urine analysis:    Component Value Date/Time   COLORURINE YELLOW 03/13/2023 1512   APPEARANCEUR CLEAR 03/13/2023 1512   LABSPEC 1.020 03/13/2023 1512   PHURINE 5.5 03/13/2023 1512   GLUCOSEU NEGATIVE 03/13/2023 1512   HGBUR NEGATIVE 03/13/2023 1512   BILIRUBINUR neg 08/03/2023 1351   KETONESUR negative 04/03/2023 1532   KETONESUR TRACE (A) 03/13/2023 1512   PROTEINUR Negative 08/03/2023 1351   PROTEINUR NEGATIVE 03/30/2021 1416   UROBILINOGEN negative (A) 08/03/2023 1351   UROBILINOGEN 1.0 03/13/2023 1512   NITRITE neg 08/03/2023 1351   NITRITE NEGATIVE 03/13/2023 1512   LEUKOCYTESUR Negative 08/03/2023 1351   LEUKOCYTESUR NEGATIVE 03/13/2023 1512   Sepsis Labs: @LABRCNTIP (procalcitonin:4,lacticidven:4)  ) Recent Results (from the past 240 hour(s))  Resp panel by RT-PCR (RSV, Flu A&B, Covid) Anterior Nasal Swab     Status: None    Collection Time: 08/29/23  4:25 PM   Specimen: Anterior Nasal Swab  Result Value Ref Range Status   SARS Coronavirus 2 by RT PCR NEGATIVE NEGATIVE Final    Comment: (NOTE) SARS-CoV-2 target nucleic acids are NOT DETECTED.  The SARS-CoV-2 RNA is generally detectable in upper respiratory specimens during the acute phase of infection. The lowest concentration of SARS-CoV-2 viral copies this assay can detect is 138 copies/mL. A negative result does not preclude SARS-Cov-2 infection and should not be used as the sole basis for treatment or other patient management decisions. A negative result may occur with  improper specimen collection/handling, submission of specimen other than nasopharyngeal swab, presence of viral mutation(s) within the areas targeted by this assay, and inadequate number of viral copies(<138 copies/mL). A negative result must be combined with clinical observations, patient history, and epidemiological information. The expected result is Negative.  Fact Sheet for Patients:  BloggerCourse.com  Fact Sheet for Healthcare Providers:  SeriousBroker.it  This test is no t yet approved or cleared by the [place] and  has been authorized for detection and/or diagnosis of SARS-CoV-2 by FDA under an Emergency Use Authorization (EUA). This EUA will remain  in effect (meaning this test can be used) for the duration of the COVID-19 declaration under Section 564(b)(1) of the Act, 21 U.S.C.section 360bbb-3(b)(1), unless the authorization is terminated  or revoked sooner.       Influenza A by PCR NEGATIVE NEGATIVE Final   Influenza B by PCR NEGATIVE NEGATIVE Final    Comment: (NOTE) The Xpert Xpress SARS-CoV-2/FLU/RSV plus assay is intended as an aid in the diagnosis of influenza from Nasopharyngeal swab specimens and should not be used as a sole basis for treatment. Nasal washings and aspirates are unacceptable for  Xpert Xpress SARS-CoV-2/FLU/RSV testing.  Fact Sheet for Patients: BloggerCourse.com  Fact Sheet for Healthcare Providers: SeriousBroker.it  This test is not yet approved or cleared by the [place] FDA and has been authorized for detection and/or diagnosis of SARS-CoV-2 by FDA under an Emergency Use Authorization (EUA). This EUA will remain in effect (meaning this test can be used) for the duration of the COVID-19 declaration under Section 564(b)(1) of the Act, 21 U.S.C. section 360bbb-3(b)(1), unless the authorization is terminated or revoked.     Resp Syncytial Virus by PCR NEGATIVE NEGATIVE Final    Comment: (NOTE) Fact Sheet for Patients: BloggerCourse.com  Fact Sheet for Healthcare Providers: SeriousBroker.it  This test is not yet approved or cleared by the [place] FDA and has been authorized for detection and/or diagnosis of SARS-CoV-2 by FDA under an Emergency Use Authorization (EUA). This EUA will remain in effect (meaning this test can be used) for the duration of the COVID-19 declaration under Section 564(b)(1) of the Act, 21 U.S.C. section 360bbb-3(b)(1), unless the authorization is terminated or revoked.  Performed at [facility], [address] [place], [place] [address]       Studies: DG Chest 2 View  Result Date: 08/29/2023 CLINICAL DATA:  Shortness of breath and weight gain. On home oxygen. EXAM: CHEST - 2 VIEW COMPARISON:  08/13/2023 FINDINGS: Midline trachea. Moderate to marked cardiomegaly again with a configuration which can be seen with pericardial effusion. Small left and trace right pleural effusions, chronic. No pneumothorax. Interstitial edema is mild, improved. Improved bibasilar airspace disease. Surgical clips project over the left axilla. IVC filter. IMPRESSION: Cardiomegaly with improved mild congestive heart  failure. Small, left-greater-than-right pleural effusions are similar. Adjacent presumed atelectasis at the lung bases is improved. Electronically Signed   By: Jeronimo Greaves M.D.   On: 08/29/2023 17:59    Scheduled Meds:  furosemide  40 mg Intravenous Q12H   heparin  5,000 Units Subcutaneous Q8H   montelukast  10 mg Oral QHS   sodium chloride flush  3 mL Intravenous Q12H   spironolactone  12.5 mg Oral Daily    Continuous Infusions:   LOS: 1 day     Briant Cedar, MD [facility]  If 7PM-7AM, please contact night-coverage [email or web address] 08/30/2023, 11:06 AM

## 2023-08-31 ENCOUNTER — Inpatient Hospital Stay (HOSPITAL_COMMUNITY): Payer: Medicare PPO

## 2023-08-31 ENCOUNTER — Encounter (HOSPITAL_COMMUNITY): Payer: Self-pay | Admitting: Family Medicine

## 2023-08-31 ENCOUNTER — Other Ambulatory Visit: Payer: Self-pay | Admitting: Internal Medicine

## 2023-08-31 ENCOUNTER — Ambulatory Visit: Payer: Medicare PPO | Admitting: Pulmonary Disease

## 2023-08-31 DIAGNOSIS — M349 Systemic sclerosis, unspecified: Secondary | ICD-10-CM

## 2023-08-31 DIAGNOSIS — Z9981 Dependence on supplemental oxygen: Secondary | ICD-10-CM

## 2023-08-31 DIAGNOSIS — I3139 Other pericardial effusion (noninflammatory): Secondary | ICD-10-CM

## 2023-08-31 DIAGNOSIS — I350 Nonrheumatic aortic (valve) stenosis: Secondary | ICD-10-CM

## 2023-08-31 DIAGNOSIS — I5033 Acute on chronic diastolic (congestive) heart failure: Secondary | ICD-10-CM | POA: Diagnosis not present

## 2023-08-31 DIAGNOSIS — I5031 Acute diastolic (congestive) heart failure: Secondary | ICD-10-CM | POA: Diagnosis not present

## 2023-08-31 DIAGNOSIS — R7989 Other specified abnormal findings of blood chemistry: Secondary | ICD-10-CM

## 2023-08-31 DIAGNOSIS — J849 Interstitial pulmonary disease, unspecified: Secondary | ICD-10-CM

## 2023-08-31 LAB — HEMOCHROMATOSIS DNA-PCR(C282Y,H63D)

## 2023-08-31 LAB — ECHOCARDIOGRAM LIMITED
Height: 63 in
Weight: 1714.3 [oz_av]

## 2023-08-31 LAB — BASIC METABOLIC PANEL
Anion gap: 9 (ref 5–15)
BUN: 61 mg/dL — ABNORMAL HIGH (ref 8–23)
CO2: 34 mmol/L — ABNORMAL HIGH (ref 22–32)
Calcium: 8.5 mg/dL — ABNORMAL LOW (ref 8.9–10.3)
Chloride: 88 mmol/L — ABNORMAL LOW (ref 98–111)
Creatinine, Ser: 1.23 mg/dL — ABNORMAL HIGH (ref 0.44–1.00)
GFR, Estimated: 43 mL/min — ABNORMAL LOW (ref >60)
Glucose, Bld: 95 mg/dL (ref 70–99)
Potassium: 4.1 mmol/L (ref 3.5–5.1)
Sodium: 131 mmol/L — ABNORMAL LOW (ref 135–145)

## 2023-08-31 LAB — CBC
HCT: 27.4 % — ABNORMAL LOW (ref 36.0–46.0)
Hemoglobin: 8.8 g/dL — ABNORMAL LOW (ref 12.0–15.0)
MCH: 35.5 pg — ABNORMAL HIGH (ref 26.0–34.0)
MCHC: 32.1 g/dL (ref 30.0–36.0)
MCV: 110.5 fL — ABNORMAL HIGH (ref 80.0–100.0)
Platelets: 159 10*3/uL (ref 150–400)
RBC: 2.48 MIL/uL — ABNORMAL LOW (ref 3.87–5.11)
RDW: 13.4 % (ref 11.5–15.5)
WBC: 8.5 10*3/uL (ref 4.0–10.5)
nRBC: 0 % (ref 0.0–0.2)

## 2023-08-31 MED ORDER — MACITENTAN 10 MG PO TABS
10.0000 mg | ORAL_TABLET | Freq: Every day | ORAL | Status: DC
  Administered 2023-08-31: 10 mg via ORAL
  Filled 2023-08-31: qty 1

## 2023-08-31 MED ORDER — MACITENTAN 10 MG PO TABS: 10.0000 mg | ORAL_TABLET | Freq: Every day | ORAL | Status: DC

## 2023-08-31 MED ORDER — FUROSEMIDE 10 MG/ML IJ SOLN
40.0000 mg | Freq: Two times a day (BID) | INTRAMUSCULAR | Status: DC
  Administered 2023-08-31 – 2023-09-01 (×2): 40 mg via INTRAVENOUS
  Filled 2023-08-31 (×2): qty 4

## 2023-08-31 MED ORDER — EMPAGLIFLOZIN 10 MG PO TABS
10.0000 mg | ORAL_TABLET | Freq: Every day | ORAL | Status: DC
  Administered 2023-08-31 – 2023-09-03 (×4): 10 mg via ORAL
  Filled 2023-08-31 (×4): qty 1

## 2023-08-31 MED ORDER — FUROSEMIDE 10 MG/ML IJ SOLN
40.0000 mg | Freq: Every day | INTRAMUSCULAR | Status: DC
  Administered 2023-08-31: 40 mg via INTRAVENOUS
  Filled 2023-08-31: qty 4

## 2023-08-31 NOTE — Progress Notes (Signed)
Assumed care from off going RN. No changes in initial am assessment. Cont with plan of care.

## 2023-08-31 NOTE — Evaluation (Signed)
Occupational Therapy Evaluation Patient Details Name: Paula Ward MRN: [id number] DOB: 26-Jan-1936 Today's Date: 08/31/2023   History of Present Illness 87 yo female presents to therapy following hospital admission on 08/29/2023 due to progressive SOB and 3# weight gain overnight. Pt recently admitted to hospital 10/24-10/26 for CHF. Pt is on 3 L/min supplemental O2 baseline. CXR noted for cardiomegaly, mild CHF and pleural effusions. Pt admitted for CHF. Pt PMH includes but is not limited to: anemia, angiodysplasia of stomach and duodenum, arthus phenomenon, L ba ca, PE, pulmonary HTN, renal cell carcinoma, and  L IM nail (2023).   Clinical Impression   RN cleared for pt participation in OT eval. Prior to hospital admission, pt was mod independent with ADLs/IADLs. Pt lives with spouse in 1 level home with 3 STE, and has hired an Engineer, production for household duties. Pt currently requires CGA - supervision for bed mobility and ADLs, benefits from cues for safety. Pt would benefit from skilled OT services to address noted impairments and functional limitations (see below for any additional details) in order to maximize safety and independence while minimizing falls risk and caregiver burden. Anticipate the need for follow up [facility] OT services upon acute hospital DC.        If plan is discharge home, recommend the following: Assistance with cooking/housework;Assist for transportation;A little help with walking and/or transfers;A little help with bathing/dressing/bathroom    Functional Status Assessment  Patient has had a recent decline in their functional status and demonstrates the ability to make significant improvements in function in a reasonable and predictable amount of time.  Equipment Recommendations  BSC/3in1       Precautions / Restrictions Precautions Precautions: Fall Restrictions Weight Bearing Restrictions: No      Mobility Bed Mobility Overal bed mobility: Needs Assistance Bed  Mobility: Supine to Sit     Supine to sit: Supervision, HOB elevated, Used rails     General bed mobility comments: min cues    Transfers Overall transfer level: Needs assistance Equipment used: None Transfers: Sit to/from Stand Sit to Stand: Contact guard assist           General transfer comment: min cues for safety      Balance Overall balance assessment: Mild deficits observed, not formally tested                                         ADL either performed or assessed with clinical judgement   ADL Overall ADL's : Needs assistance/impaired     Grooming: Wash/dry face;Wash/dry hands;Oral care;Standing;Contact guard assist                   Toilet Transfer: Cueing for safety;Ambulation;Contact guard assist           Functional mobility during ADLs: Contact guard assist General ADL Comments: Requires cues for safety awareness - pt attempting to squeeze between recliner and sink without waiting for OT to move recliner     Vision Baseline Vision/History: 0 No visual deficits Ability to See in Adequate Light: 0 Adequate Patient Visual Report: No change from baseline              Pertinent Vitals/Pain Pain Assessment Pain Assessment: No/denies pain     Extremity/Trunk Assessment     Lower Extremity Assessment Lower Extremity Assessment: Overall WFL for tasks assessed   Cervical / Trunk Assessment Cervical / Trunk  Assessment: Normal   Communication Communication Communication: No apparent difficulties   Cognition Arousal: Alert Behavior During Therapy: WFL for tasks assessed/performed Overall Cognitive Status: Within Functional Limits for tasks assessed                                 General Comments: mild decreased safety awareness noted with mobility     General Comments  O2 sats >90% on 3L, HR 80-92, pt denying SOB            Home Living Family/patient expects to be discharged to:: Private  residence Living Arrangements: Spouse/significant other Available Help at Discharge: Family;Available PRN/intermittently Type of Home: House Home Access: Stairs to enter [employer] of Steps: 3 Entrance Stairs-Rails: Right;Left;Can reach both Home Layout: One level     Bathroom Shower/Tub: Producer, television/film/video: Standard Bathroom Accessibility: Yes   Home Equipment: Grab bars - tub/shower;Cane - single point;Other (comment);Shower seat;Hand held Programmer, systems (2 wheels)   Additional Comments: PCA 12 hrs per wk for household management and to assist pt and husband as needed. pulse oximiter  3 L/min supplemental O2 at baseline, custom built up shoe for L      Prior Functioning/Environment Prior Level of Function : Independent/Modified Independent             Mobility Comments: SPC in home and community ADLs Comments: Mod  independent for ADLs / IADLs including driving        OT Problem List: Cardiopulmonary status limiting activity;Decreased activity tolerance;Impaired balance (sitting and/or standing)      OT Treatment/Interventions: Self-care/ADL training;Energy conservation;Therapeutic exercise;DME and/or AE instruction;Therapeutic activities;Patient/family education;Balance training    OT Goals(Current goals can be found in the care plan section) Acute Rehab OT Goals OT Goal Formulation: With patient Time For Goal Achievement: 09/14/23 Potential to Achieve Goals: Good  OT Frequency: Min 1X/week       AM-PAC OT "6 Clicks" Daily Activity     Outcome Measure Help from another person eating meals?: None Help from another person taking care of personal grooming?: A Little Help from another person toileting, which includes using toliet, bedpan, or urinal?: A Little Help from another person bathing (including washing, rinsing, drying)?: A Little Help from another person to put on and taking off regular upper body clothing?: A  Little Help from another person to put on and taking off regular lower body clothing?: A Little 6 Click Score: 19   End of Session Equipment Utilized During Treatment: Gait belt Nurse Communication: Mobility status;Other (comment)  Activity Tolerance: Patient tolerated treatment well;No increased pain Patient left: with call bell/phone within reach;in bed;with bed alarm set  OT Visit Diagnosis: Muscle weakness (generalized) (M62.81)                Time: 4098-1191 OT Time Calculation (min): 18 min Charges:  OT General Charges $OT Visit: 1 Visit OT Evaluation $OT Eval Low Complexity: 1 Low OT Treatments $Self Care/Home Management : 8-22 mins Maximum Reiland L. Fynn Vanblarcom, OTR/L  08/31/23, 5:13 PM

## 2023-08-31 NOTE — Progress Notes (Signed)
OT Cancellation Note  Patient Details Name: Paula Ward MRN: [id number] DOB: [birth year]   Cancelled Treatment:    Reason Eval/Treat Not Completed: Patient at procedure or test/ unavailable (imaging)  Victorino Dike L. Orlondo [place], OTR/L  08/31/23, 1:34 PM

## 2023-08-31 NOTE — Consult Note (Addendum)
Cardiology Consultation   Patient ID: Paula Ward MRN: [id number]; DOB: 1935-11-03  Admit date: 08/29/2023 Date of Consult: 08/31/2023  PCP:  [name], MD   [facility] Providers Cardiologist:  Olga Millers, MD       Referring physician: Dr. Sharolyn Douglas   Cc: Shortness of breath, lower extremity swelling, weight gain  Patient Profile:   Paula Ward is a 87 y.o. 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 Sharolyn Douglas.  History of Present Illness:   Paula Ward has extremely complex cardiac history as above. She previously was followed at [facility] for her pulmonary HTN, treated with macitentan/Opsumit. Notes indicate she did not previously want to try Flolan. She apparently did not tolerate CellCept or azathioprine for her pulmonary fibrosis and so received treatment with steroids. She is known to have large pericardial effusion going back many years. She has also been seen by the structural team her her aortic stenosis, with cath 04/2022 showing ectatic but patent coronaries with mild pHTN and moderate AS, not felt to require intervention at that time. She is also followed by pulmonology with most recent phone notes indicating some difficulty getting Opsumit rx filled with specialty pharmacy. She has been followed closely in clinic for diuretic adjustment this year and required admission last month for a/c HFpEF requiring IV diuresis and possible  cellulitis. Admit weight was 108lb, DC weight unavailable, has varied 99-110lb in recent months. Echo during admit 08/15/23 showed EF >75%, G1DD, severe LAE, severe MAC, mild aortic stenosis, large pericardial effusion without tamponade physiology without acute change from prior. RLE duplex was negative for DVT. She also had a bone marrow bx during admission which, per Dr. Derek Mound OP note 08/24/23, indicated "no clear cause for the macrocytosis, though excessive iron deposition was noted. Will order HFE testing today. More likely the iron deposition is from chronic disease with CKD."  Despite adherence to her medication regimen, she developed worsening weight gain, orthopnea, and edema prompting return to the ED 08/29/23. She was afebrile and at baseline O2 sat on arrival. CXR showed cardiomegaly with improved mild CHF, improved atx, small L>R pleural effusions similar to prior. Labs show hyponatremia, BNP 2168 in setting of Cr 1.33 (similar to baseline), hsTroponin 22->23, Hgb 8.5->8.3 (slight decline from prior 9's). She received 40mg  IV Lasix on 11/9, then 40mg  IV BID on 11/10, then 40mg  IV today with -2L net output, weights variable (110->102-107).    Past Medical History:  Diagnosis Date   Anemia    Angiodysplasia of stomach and duodenum    Aortic stenosis    Arthus phenomenon    AVM (arteriovenous malformation)    Blood transfusion without reported diagnosis    last 4 units 12-22-15, Iron infusion x2 last -01-07-16,01-14-16.   Breast cancer (HCC) 1989   Left   Candida esophagitis (HCC)    Cataract    Chronic kidney disease    Chronic mild renal insuffiency   Chronic respiratory failure (HCC)    Corneal edema  Corneal epithelial basement membrane dystrophy    CREST syndrome (HCC)    GERD (gastroesophageal reflux disease)    w/ HH   Interstitial lung disease (HCC)    Nodule of kidney    Pericardial effusion    PONV (postoperative nausea and vomiting)    Pulmonary embolus (HCC) 2003    Pulmonary hypertension (HCC)    Rectal incontinence    Renal cell carcinoma (HCC)    Scleroderma (HCC)    Tubular adenoma of colon    Uterine polyp     Past Surgical History:  Procedure Laterality Date   APPENDECTOMY  1962   BREAST LUMPECTOMY  1989   left   CATARACT EXTRACTION, BILATERAL Bilateral 12/2013   COLONOSCOPY WITH PROPOFOL N/A 04/15/2021   Procedure: COLONOSCOPY WITH PROPOFOL;  Surgeon: Rachael Fee, MD;  Location: [facility];  Service: Endoscopy;  Laterality: N/A;   ENTEROSCOPY N/A 01/18/2016   Procedure: ENTEROSCOPY;  Surgeon: Napoleon Form, MD;  Location: [facility];  Service: Endoscopy;  Laterality: N/A;   ENTEROSCOPY N/A 05/24/2018   Procedure: ENTEROSCOPY;  Surgeon: Lynann Bologna, MD;  Location: [facility];  Service: Endoscopy;  Laterality: N/A;   ENTEROSCOPY N/A 03/29/2021   Procedure: ENTEROSCOPY;  Surgeon: Beverley Fiedler, MD;  Location: [facility];  Service: Gastroenterology;  Laterality: N/A;   ENTEROSCOPY N/A 04/13/2021   Procedure: ENTEROSCOPY;  Surgeon: Benancio Deeds, MD;  [facility]: [facility];  Service: Gastroenterology;  Laterality: N/A;   ESOPHAGOGASTRODUODENOSCOPY (EGD) WITH PROPOFOL N/A 12/21/2015   Procedure: ESOPHAGOGASTRODUODENOSCOPY (EGD) WITH PROPOFOL;  Surgeon: Hilarie Fredrickson, MD;  [facility];  Service: Endoscopy;  Laterality: N/A;   HOT HEMOSTASIS N/A 05/24/2018   Procedure: HOT HEMOSTASIS (ARGON PLASMA COAGULATION/BICAP);  Surgeon: Lynann Bologna, MD;  Location: [facility];  Service: Endoscopy;  Laterality: N/A;   HOT HEMOSTASIS N/A 03/29/2021   Procedure: HOT HEMOSTASIS (ARGON PLASMA COAGULATION/BICAP);  Surgeon: Beverley Fiedler, MD;  Location: [facility];  Service: Gastroenterology;  Laterality: N/A;   HOT HEMOSTASIS N/A 04/13/2021   Procedure: HOT HEMOSTASIS (ARGON PLASMA COAGULATION/BICAP);  Surgeon: Benancio Deeds, MD;  Location: [facility];  Service: Gastroenterology;  Laterality: N/A;   INTRAMEDULLARY (IM) NAIL  INTERTROCHANTERIC Left 10/11/2022   Procedure: INTRAMEDULLARY (IM) NAIL INTERTROCHANTERIC;  Surgeon: Ollen Gross, MD;  [facility]: [facility];  Service: Orthopedics;  Laterality: Left;   IR GENERIC HISTORICAL  06/05/2016   IR RADIOLOGIST EVAL & MGMT 06/05/2016 [name], MD [facility] INTERV RAD   IVC Filter     KIDNEY SURGERY     left -"laser surgery by Dr. Fredia Sorrow- 5 yrs ago" no removal   RIGHT/LEFT HEART CATH AND CORONARY ANGIOGRAPHY N/A 04/25/2022   Procedure: RIGHT/LEFT HEART CATH AND CORONARY ANGIOGRAPHY;  Surgeon: Tonny Bollman, MD;  Location: [facility] INVASIVE CV LAB;  Service: Cardiovascular;  Laterality: N/A;   SCHLEROTHERAPY  05/24/2018   Procedure: Theresia Majors;  Surgeon: Lynann Bologna, MD;  [facility];  Service: Endoscopy;;   SUBMUCOSAL TATTOO INJECTION  04/13/2021   Procedure: SUBMUCOSAL TATTOO INJECTION;  Surgeon: Benancio Deeds, MD;  Location: [facility];  Service: Gastroenterology;;   TONSILLECTOMY     TUBAL LIGATION       Home Medications:  Prior to Admission medications   Medication Sig Start Date End Date Taking? Authorizing Provider  acetaminophen (TYLENOL) 325 MG tablet Take 2 tablets (650 mg total) by mouth every 6 (six) hours as needed for moderate pain. 07/18/22  Yes Wallis Bamberg, PA-C  albuterol (VENTOLIN HFA) 108 (90 Base) MCG/ACT  inhaler INHALE 2 PUFFS INTO THE LUNGS EVERY 6 HOURS AS NEEDED FOR WHEEZING OR SHORTNESS OF BREATH Patient taking differently: Inhale 2 puffs into the lungs every 6 (six) hours as needed for wheezing or shortness of breath. 03/13/23  Yes [name], MD  augmented betamethasone dipropionate (DIPROLENE-AF) 0.05 % cream Apply 1 application  topically 2 (two) times daily as needed (for irritation). 07/13/23  Yes [provider]  clobetasol ointment (TEMOVATE) 0.05 % Apply 1 Application topically 2 (two) times daily as needed (for irritation). 04/20/23  Yes [provider]  docusate sodium (COLACE) 100 MG  capsule Take 1 capsule (100 mg total) by mouth 2 (two) times daily. Patient taking differently: Take 100 mg by mouth in the morning. 10/15/22  Yes Edmisten, Kristie L, PA  famotidine (PEPCID) 20 MG tablet Take 1 tablet (20 mg total) by mouth at bedtime. 02/10/23  Yes Nandigam, Eleonore Chiquito, MD  fluticasone (FLONASE) 50 MCG/ACT nasal spray USE 1 SPRAY(S) IN EACH NOSTRIL ONCE DAILY AS NEEDED FOR ALLERGIES Patient taking differently: Place 1 spray into both nostrils in the morning. 08/11/23  Yes [name], MD  furosemide (LASIX) 20 MG tablet Take 3 tablets (60 mg total) by mouth daily. 08/21/23  Yes Cleaver, Thomasene Ripple, NP  guaiFENesin (MUCINEX) 600 MG 12 hr tablet Take 600 mg by mouth 2 (two) times daily as needed.   Yes [provider]  ipratropium (ATROVENT) 0.03 % nasal spray Place 2 sprays into both nostrils every 12 (twelve) hours. Patient taking differently: Place 2 sprays into both nostrils at bedtime. 05/12/22  Yes Martina Sinner, MD  latanoprost (XALATAN) 0.005 % ophthalmic solution Place 1 drop into both eyes at bedtime. 09/23/22  Yes [provider]  magic mouthwash SOLN Take 10 mLs by mouth 4 (four) times daily as needed for mouth pain. 03/22/22  Yes Adhikari, Willia Craze, MD  montelukast (SINGULAIR) 10 MG tablet TAKE 1 TABLET BY MOUTH AT BEDTIME Patient taking differently: Take 10 mg by mouth at bedtime. TAKE 1 TABLET BY MOUTH AT BEDTIME 07/31/23  Yes [name], MD  mupirocin ointment (BACTROBAN) 2 % Apply 1 Application topically 2 (two) times daily as needed (for irritation).   Yes [provider]  Nutritional Supplements (ENSURE HIGH PROTEIN) LIQD Take 0.5 Bottles by mouth daily as needed (for supplementation).   Yes [provider]  OPSUMIT 10 MG tablet Take 1 tablet (10 mg total) by mouth daily. 07/07/23  Yes Parrett, Tammy S, NP  pantoprazole (PROTONIX) 40 MG tablet Take 1 tablet (40 mg total) by mouth daily. Patient taking differently:  Take 40 mg by mouth daily as needed (for acid reflux). 02/10/23  Yes Nandigam, Eleonore Chiquito, MD  polyethylene glycol (MIRALAX / GLYCOLAX) 17 g packet Take 17 g by mouth daily. 10/15/22  Yes Edmisten, Kristie L, PA  spironolactone (ALDACTONE) 25 MG tablet Take 0.5 tablets (12.5 mg total) by mouth daily. 08/15/23  Yes Lewie Chamber, MD  sucralfate (CARAFATE) 1 GM/10ML suspension TAKE 10 MLS BY MOUTH EVERY 6 HOURS AS NEEDED FOR REFLUX Patient taking differently: Take 1 g by mouth every 6 (six) hours as needed (to coat the stomach). 02/11/23  Yes Nandigam, Eleonore Chiquito, MD  triamcinolone cream (KENALOG) 0.1 % Apply 1 Application topically See admin instructions. Apply to affected leg(s) after showers and up to 2 additional times a day as needed for irritation 07/07/23  Yes [provider]  empagliflozin (JARDIANCE) 10 MG TABS tablet Take 1 tablet (10 mg  total) by mouth daily. Patient not taking: Reported on 08/29/2023 08/16/23   Lewie Chamber, MD  mirtazapine (REMERON) 15 MG tablet Take 1 tablet (15 mg total) by mouth at bedtime. Patient not taking: Reported on 08/21/2023 03/13/23   [name], MD  OXYGEN Inhale 2-3 L/min into the lungs continuous.    [provider]    Inpatient Medications: Scheduled Meds:  docusate sodium  100 mg Oral BID   enoxaparin (LOVENOX) injection  30 mg Subcutaneous Daily   famotidine  20 mg Oral QHS   fluticasone  1 spray Each Nare q AM   furosemide  40 mg Intravenous Daily   macitentan  10 mg Oral Daily   montelukast  10 mg Oral QHS   sodium chloride flush  3 mL Intravenous Q12H   spironolactone  12.5 mg Oral Daily   Continuous Infusions:  PRN Meds: acetaminophen **OR** acetaminophen, albuterol, ondansetron **OR** ondansetron (ZOFRAN) IV, senna-docusate, sucralfate  Allergies:    Allergies  Allergen Reactions   Codeine Nausea Only    Hallucinations, too   Other Nausea And Vomiting and Other (See Comments)    "-mycin" antibiotics.   Also  cause hallucinations.   Erythromycin Nausea And Vomiting   Iodinated Contrast Media Other (See Comments)    Renal issues   Lisinopril     Pt doesn't remember reaction   Mirtazapine Other (See Comments)    "Threw me into orbit"   Mycophenolate Mofetil Nausea Only   Sulfa Antibiotics Other (See Comments)    Unknown reaction    Social History:   Social History   Socioeconomic History   Marital status: Married    Spouse name: Not on file   Number of children: 2   Years of education: Not on file   Highest education level: Not on file  Occupational History   Occupation: Retired  Tobacco Use   Smoking status: Never   Smokeless tobacco: Never  Vaping Use   Vaping status: Never Used  Substance and Sexual Activity   Alcohol use: No   Drug use: No   Sexual activity: Not Currently  Other Topics Concern   Not on file  Social History Narrative   Married '611 son- '65, 1 daughter '63; 6 children (2 adopted)SO- SOBRetirement- doing well. Marriage in good health. Patient has never smoked. Alcohol use- noPt gets regular exercise   Social Determinants of Health   Financial Resource Strain: Low Risk  (04/02/2022)   Overall Financial Resource Strain (CARDIA)    Difficulty of Paying Living Expenses: Not hard at all  Food Insecurity: No Food Insecurity (08/29/2023)   Hunger Vital Sign    Worried About Running Out of Food in the Last Year: Never true    Ran Out of Food in the Last Year: Never true  Transportation Needs: No Transportation Needs (08/29/2023)   PRAPARE - Administrator, Civil Service (Medical): No    Lack of Transportation (Non-Medical): No  Physical Activity: Inactive (04/02/2022)   Exercise Vital Sign    Days of Exercise per Week: 0 days    Minutes of Exercise per Session: 0 min  Stress: No Stress Concern Present (04/02/2022)   [employer] of Occupational Health - Occupational Stress Questionnaire    Feeling of Stress : Not at all  Social Connections:  Socially Integrated (04/02/2022)   Social Connection and Isolation Panel [NHANES]    Frequency of Communication with Friends and Family: More than three times a week    Frequency  of Social Gatherings with Friends and Family: More than three times a week    Attends Religious Services: More than 4 times per year    Active Member of [employer] or Organizations: Yes    Attends Engineer, structural: More than 4 times per year    Marital Status: Married  Catering manager Violence: Not At Risk (08/29/2023)   Humiliation, Afraid, Rape, and Kick questionnaire    Fear of Current or Ex-Partner: No    Emotionally Abused: No    Physically Abused: No    Sexually Abused: No    Family History:    Family History  Problem Relation Age of Onset   Bladder Cancer Father    Diabetes Father    Prostate cancer Father    Alzheimer's disease Mother    Diabetes Sister    Lung cancer Sister         smoker   Esophageal cancer Paternal Uncle    Colon cancer Neg Hx      ROS:  Review of Systems  Constitutional: Positive for malaise/fatigue and weight gain.  Cardiovascular:  Positive for leg swelling, orthopnea and paroxysmal nocturnal dyspnea. Negative for chest pain, claudication, irregular heartbeat, near-syncope, palpitations and syncope.  Respiratory:  Positive for shortness of breath.   Hematologic/Lymphatic: Negative for bleeding problem.     Physical Exam/Data:   Vitals:   08/30/23 2101 08/31/23 0513 08/31/23 0516 08/31/23 1303  BP: (!) 129/48  (!) 123/46 124/62  Pulse: 86  83 87  Resp: 17  17 20   Temp: 98.1 F (36.7 C)  98.2 F (36.8 C) 97.8 F (36.6 C)  TempSrc: Oral  Oral Oral  SpO2: 96%  96% 100%  Weight:  48.6 kg    Height:        Intake/Output Summary (Last 24 hours) at 08/31/2023 1311 Last data filed at 08/31/2023 1028 Gross per 24 hour  Intake 460 ml  Output 2000 ml  Net -1540 ml      08/31/2023    5:13 AM 08/30/2023    6:25 AM 08/29/2023    3:37 PM  Last 3 Weights   Weight (lbs) 107 lb 2.3 oz 102 lb 12.8 oz 110 lb  Weight (kg) 48.6 kg 46.63 kg 49.896 kg     Body mass index is 18.98 kg/m.   Physical Exam  Constitutional: No distress. She appears chronically ill.  hemodynamically stable  Neck: No JVD present.  Cardiovascular: Normal rate, regular rhythm, S1 normal and S2 normal. Exam reveals no gallop, no S3 and no S4.  Murmur heard. Crescendo-decrescendo systolic murmur is present with a grade of 3/6 at the upper right sternal border. Rumbling diastolic murmur is present at the apex. Pulmonary/Chest: Effort normal. No stridor. She has bibasilar rales and scattered wheezes.  Abdominal: Soft. Bowel sounds are normal. She exhibits no distension. There is no abdominal tenderness.  Musculoskeletal:        General: Edema (trace bilaterally) present.     Cervical back: Neck supple.     Comments: RLE warm and erythematous  Neurological: She is alert and oriented to person, place, and time. She has intact cranial nerves (2-12).  Skin: Skin is warm.    EKG:  The EKG was personally reviewed and demonstrates: 08/29/2023: Sinus rhythm, 74 bpm, first-degree AV block, low voltage throughout, without underlying injury pattern  Relevant CV Studies: Echo 08/15/23   1. Left ventricular ejection fraction, by estimation, is >75%. The left  ventricle has hyperdynamic function. The left ventricle  has no regional  wall motion abnormalities. There is severe left ventricular hypertrophy.  Left ventricular diastolic  parameters are consistent with Grade I diastolic dysfunction (impaired  relaxation).   2. Right ventricular systolic function is normal. The right ventricular  size is normal.   3. Left atrial size was severely dilated.   4. Large pericardial effusion. The pericardial effusion is  circumferential. There is no evidence of cardiac tamponade.   5. The mitral valve is normal in structure. No evidence of mitral valve  regurgitation. No evidence of mitral  stenosis. Severe mitral annular  calcification.   6. The aortic valve is normal in structure. There is severe calcifcation  of the aortic valve. There is moderate thickening of the aortic valve.  Aortic valve regurgitation is not visualized. Mild aortic valve stenosis.  Aortic valve mean gradient measures   14.8 mmHg. Aortic valve Vmax measures 2.59 m/s.   7. The inferior vena cava is normal in size with greater than 50%  respiratory variability, suggesting right atrial pressure of 3 mmHg.   Comparison(s): Large pericardial effusion noted on prior ECHO, no major  change.   Conclusion(s)/Recommendation(s): Findings consistent with hypertrophic  cardiomyopathy.   Limited Echo 08/31/2023:  1. Left ventricular ejection fraction, by estimation, is 60 to 65%. The  left ventricle has normal function. The left ventricle has no regional  wall motion abnormalities.   2. Right ventricular systolic function is normal. The right ventricular  size is normal.   3. Left atrial size was mildly dilated.   4. Severe mitral annular calcification. Visually there is mitral stenosis  but doppler interrogation was not done.   5. The aortic valve is tricuspid. There is severe calcifcation of the  aortic valve. Visually there is aortic stenosis but doppler interrogation  was not done.   6. The inferior vena cava is normal in size with <50% respiratory  variability, suggesting right atrial pressure of 8 mmHg.   7. There was a large circumferential pericardial effusion. There was no  RV diastolic collapse noted. Mitral E inflow velocity showed < 25%  respirophasic variation. The IVC was not significantly dilated. There does  not appear to be tamponade physiology.   8. Limited echo.   R/LHC 04/2022 1.  Ectatic but patent coronary arteries with no obstructive disease.  Right dominant coronary artery. 2.  Mild pulmonary hypertension with PA pressure 52/15, mean 28 mmHg, transpulmonary gradient 10 mmHg, PVR  approximately 2 Wood units. 3.  Moderate aortic stenosis with mean transvalvular gradient 16 mmHg calculated aortic valve area 1.16 cm 4.  Heavy mitral annular calcification by plain fluoroscopy 5.  Mild aortic valve calcification by plain fluoroscopy   Recommend: Continued medical therapy, clinical follow-up, echo surveillance.  The patient does not appear to have severe aortic stenosis and I do not think she would benefit from TAVR at this time   Laboratory Data:  High Sensitivity Troponin:   Recent Labs  Lab 08/14/23 0636 08/14/23 0828 08/29/23 1613 08/29/23 2133  TROPONINIHS 26* 27* 22* 23*     Chemistry Recent Labs  Lab 08/29/23 1613 08/30/23 0410 08/31/23 0422  NA 130* 131* 131*  K 4.7 3.9 4.1  CL 91* 91* 88*  CO2 31 31 34*  GLUCOSE 95 99 95  BUN 49* 50* 61*  CREATININE 1.33* 1.06* 1.23*  CALCIUM 8.4* 8.7* 8.5*  MG 2.3  --   --   GFRNONAA 39* 51* 43*  ANIONGAP 8 9 9     Recent Labs  Lab 08/29/23 1613  PROT 6.8  ALBUMIN 3.9  AST 27  ALT 13  ALKPHOS 54  BILITOT 1.0   Lipids No results for input(s): "CHOL", "TRIG", "HDL", "LABVLDL", "LDLCALC", "CHOLHDL" in the last 168 hours.  Hematology Recent Labs  Lab 08/29/23 1613 08/30/23 0410 08/31/23 0422  WBC 6.7 5.7 8.5  RBC 2.38* 2.37* 2.48*  HGB 8.5* 8.3* 8.8*  HCT 26.5* 26.7* 27.4*  MCV 111.3* 112.7* 110.5*  MCH 35.7* 35.0* 35.5*  MCHC 32.1 31.1 32.1  RDW 13.2 13.2 13.4  PLT 157 152 159   Thyroid No results for input(s): "TSH", "FREET4" in the last 168 hours.  BNP Recent Labs  Lab 08/29/23 1613  BNP 2,168.5*    DDimer No results for input(s): "DDIMER" in the last 168 hours.   Radiology/Studies:  DG Chest 2 View  Result Date: 08/29/2023 CLINICAL DATA:  Shortness of breath and weight gain. On home oxygen. EXAM: CHEST - 2 VIEW COMPARISON:  08/13/2023 FINDINGS: Midline trachea. Moderate to marked cardiomegaly again with a configuration which can be seen with pericardial effusion. Small left and  trace right pleural effusions, chronic. No pneumothorax. Interstitial edema is mild, improved. Improved bibasilar airspace disease. Surgical clips project over the left axilla. IVC filter. IMPRESSION: Cardiomegaly with improved mild congestive heart failure. Small, left-greater-than-right pleural effusions are similar. Adjacent presumed atelectasis at the lung bases is improved. Electronically Signed   By: Jeronimo Greaves M.D.   On: 08/29/2023 17:59     Assessment and Plan:  Assessment: 1. Acute on chronic HFpEF, multifactorial 2. Chronic large pericardial effusion 3. Pulmonary HTN due to scleroderma with concomitant ILD with chronic respiratory failure 4. Aortic stenosis, moderate by 2023 cath, mild on 07/2023 echo 5. Minimally elevated troponin suspected demand ischemia 6. Hyponatremia  7. CKD 3b  8. Acute on chronic macrocytic anemia 8. Remote hx of PE, IVC filter due to h/o GIBs  Plan: Acute on chronic HFpEF Stage C, NYHA class IIIb Patient endorses gaining at least 10 pounds since her recent discharge from hospital-likely secondary to infected diuresis.  Patient states that she been taking her diuretics as prescribed. Remains on 3 L nasal cannula oxygen-no significant increase in oxygen requirement Was d/c last time on Torsemide 20mg  po qday.  Currently on Lasix 40 mg IV push daily, will increase it to 40 mg IV push twice daily Strict I's and O's and daily weights Home weights are around 100 pounds.  Currently 108 pounds. Focus on diuresis for now. Continue spironolactone 12.5 mg p.o. daily. ACE inhibitor is listed as one of the allergies but will consider ARB closer to discharge once euvolemic  Restart Jardiance 10mg  po qday.   Chronic large pericardial effusion: Limited echocardiogram notes no tamponade physiology. Monitor for now  Pulmonary arterial  hypertension: Scleroderma  ILD Dependence on supplemental oxygen Multifactorial-scleroderma +/- ILD Was being followed by  [facility] but no [facility].   Aortic stenosis:  Asymptomatic - monitor for now.   Elevated troponin - likely demand ischemia. No chest pain, EKG non-ischemic, no arrhythmia on telemetry.   For questions or updates, please contact [facility] Please consult [email or web address] for contact info under    Signed, Tessa Lerner, DO, [facility]  [facility]  [address] [place], [place] [address] Pager: [phone] Office: [phone] 5:20 PM 08/31/23

## 2023-08-31 NOTE — Plan of Care (Signed)
  Problem: Education: Goal: Ability to demonstrate management of disease process will improve Outcome: Progressing Goal: Ability to verbalize understanding of medication therapies will improve Outcome: Progressing Goal: Individualized Educational Video(s) Outcome: Progressing   Problem: Activity: Goal: Capacity to carry out activities will improve Outcome: Progressing   

## 2023-08-31 NOTE — Progress Notes (Signed)
  Echocardiogram 2D Echocardiogram has been performed.  Paula Ward 08/31/2023, 2:20 PM

## 2023-08-31 NOTE — Evaluation (Signed)
Physical Therapy Evaluation Patient Details Name: Paula Ward MRN: [id number] DOB: [birth year] Today's Date: 08/31/2023  History of Present Illness  87 yo female presents to therapy following hospital admission on 08/29/2023 due to progressive SOB and 3# weight gain overnight. Pt recently admitted to hospital 10/24-10/26 for CHF. Pt is on 3 L/min supplemental O2 baseline. CXR noted for cardiomegaly, mild CHF and pleural effusions. Pt admitted for CHF. Pt PMH includes but is not limited to: anemia, angiodysplasia of stomach and duodenum, arthus phenomenon, L ba ca, PE, pulmonary HTN, renal cell carcinoma, and  L IM nail (2023).  Clinical Impression     Pt admitted with above diagnosis.  Pt currently with functional limitations due to the deficits listed below (see PT Problem List). Pt in bed when PT arrived. Pt agreeable to therapy intervention and motivated to get OOB, family present. Pt required S for bed mobility, S for transfer tasks at [facility] level, CGA for gait 160 feet with SPC and min cues, pt left seated in recliner all needs in place, family present and on 3 L/min supplemental O2. Pt exhibits a functional decline from baseline. Pt presented to therapy with R LE edema, rubor and calor. Nursing made aware. Pt will benefit from acute skilled PT to increase their independence and safety with mobility to allow discharge.         If plan is discharge home, recommend the following: A little help with walking and/or transfers;A little help with bathing/dressing/bathroom;Assistance with cooking/housework;Assist for transportation;Help with stairs or ramp for entrance   Can travel by private vehicle        Equipment Recommendations None recommended by PT  Recommendations for Other Services       Functional Status Assessment Patient has had a recent decline in their functional status and demonstrates the ability to make significant improvements in function in a reasonable and predictable amount  of time.     Precautions / Restrictions Precautions Precautions: Fall Restrictions Weight Bearing Restrictions: No      Mobility  Bed Mobility Overal bed mobility: Needs Assistance Bed Mobility: Supine to Sit     Supine to sit: Supervision, HOB elevated, Used rails     General bed mobility comments: min cues    Transfers Overall transfer level: Needs assistance Equipment used: Straight cane Transfers: Sit to/from Stand Sit to Stand: Supervision           General transfer comment: min cues for safety    Ambulation/Gait Ambulation/Gait assistance: Contact guard assist Gait Distance (Feet): 160 Feet Assistive device: Straight cane Gait Pattern/deviations: Step-to pattern, Decreased stance time - left, Decreased stride length Gait velocity: decreased     General Gait Details: pt amb with personal SPC with wide rubber base, pt has leg length descrepancy at baseline R >L and reports having builtup shoe not donned at time of eval, slight lateral sway.  Stairs            Wheelchair Mobility     Tilt Bed    Modified Rankin (Stroke Patients Only)       Balance Overall balance assessment: Mild deficits observed, not formally tested (no reported fall hx)                                           Pertinent Vitals/Pain Pain Assessment Pain Assessment: No/denies pain    Home Living Family/patient expects to  be discharged to:: Private residence Living Arrangements: Spouse/significant other Available Help at Discharge: Family;Available PRN/intermittently Type of Home: House Home Access: Stairs to enter Entrance Stairs-Rails: Right;Left;Can reach both Entrance Stairs-Number of Steps: 3   Home Layout: One level Home Equipment: Grab bars - tub/shower;Cane - single point;Other (comment);Shower seat;Hand held Programmer, systems (2 wheels) Additional Comments: PCA 12 hrs per wk for household management and to assist pt and husband  as needed. pulse oximiter  3 L/min supplemental O2 at baseline, custom built up shoe for L    Prior Function Prior Level of Function : Independent/Modified Independent             Mobility Comments: SPC in home and community and mod I for all ADLs, self care tasks and IADLs.       Extremity/Trunk Assessment        Lower Extremity Assessment Lower Extremity Assessment: Overall WFL for tasks assessed    Cervical / Trunk Assessment Cervical / Trunk Assessment: Normal  Communication   Communication Communication: No apparent difficulties  Cognition Arousal: Alert Behavior During Therapy: WFL for tasks assessed/performed Overall Cognitive Status: Within Functional Limits for tasks assessed                                          General Comments General comments (skin integrity, edema, etc.): O2 saturation >/=90% on 3 L/min throughout intervention, no reports of dizziness or pain, mild SOB with exertion    Exercises     Assessment/Plan    PT Assessment Patient needs continued PT services  PT Problem List Decreased balance;Decreased mobility;Cardiopulmonary status limiting activity       PT Treatment Interventions DME instruction;Gait training;Stair training;Functional mobility training;Therapeutic activities;Therapeutic exercise;Balance training;Neuromuscular re-education;Patient/family education    PT Goals (Current goals can be found in the Care Plan section)  Acute Rehab PT Goals Patient Stated Goal: to be able to do everything I was doing before PT Goal Formulation: With patient Time For Goal Achievement: 09/14/23 Potential to Achieve Goals: Good    Frequency Min 1X/week     Co-evaluation               AM-PAC PT "6 Clicks" Mobility  Outcome Measure Help needed turning from your back to your side while in a flat bed without using bedrails?: None Help needed moving from lying on your back to sitting on the side of a flat bed without  using bedrails?: A Little Help needed moving to and from a bed to a chair (including a wheelchair)?: A Little Help needed standing up from a chair using your arms (e.g., wheelchair or bedside chair)?: A Little Help needed to walk in hospital room?: A Little Help needed climbing 3-5 steps with a railing? : A Lot 6 Click Score: 18    End of Session Equipment Utilized During Treatment: Gait belt;Oxygen Activity Tolerance: Patient tolerated treatment well Patient left: in chair;with call bell/phone within reach;with family/visitor present Nurse Communication: Mobility status PT Visit Diagnosis: Unsteadiness on feet (R26.81);Difficulty in walking, not elsewhere classified (R26.2)    Time: 1116-1140 PT Time Calculation (min) (ACUTE ONLY): 24 min   Charges:   PT Evaluation $PT Eval Low Complexity: 1 Low PT Treatments $Gait Training: 8-22 mins PT General Charges $$ ACUTE PT VISIT: 1 Visit         Johnny Bridge, PT Acute Rehab   Jacqualyn Posey 08/31/2023, 2:09  PM

## 2023-08-31 NOTE — Progress Notes (Signed)
Mobility Specialist - Progress Note   08/31/23 1020  Mobility  Activity Transferred to/from [facility]  Level of Assistance Standby assist, set-up cues, supervision of patient - no hands on  Assistive Device BSC  Range of Motion/Exercises Active  Activity Response Tolerated well  Mobility Referral Yes  $Mobility charge 1 Mobility  Mobility Specialist Start Time (ACUTE ONLY) 1015  Mobility Specialist Stop Time (ACUTE ONLY) 1020  Mobility Specialist Time Calculation (min) (ACUTE ONLY) 5 min   Pt requesting assistance to [facility]. Supervised pt and pt stated wanting a couple minutes on BSC. Call bell in reach.  Billey Chang Mobility Specialist

## 2023-08-31 NOTE — Consult Note (Addendum)
NAME:  Paula Ward, MRN:  [id number], DOB:  [birth year], LOS: 2 ADMISSION DATE:  08/29/2023, CONSULTATION DATE:  08/31/2023 REFERRING MD:  Briant Cedar, MD, CHIEF COMPLAINT:  heart failure exacerbation  History of Present Illness:  Paula Ward is a 87 y.o. woman with past medical history of Scleroderma ILD and pulmonary hypertension on macitentan(opsimut.)   She is due to establish care with Dr. Judeth Horn but missed her appointment today due to hospitalization. Her home lasix dose is 60 mg daily.  She sees Cardiology here in [place]. She presented to the ED yesterday with weight gain, worsening shortness of breath. She had a similar admission in August 2024, October 2024. She was tried on torsemide but reported ineffective diuresis and has been on lasix 60 mg daily. She notes in the last week an almost 10 lb weight gain with ineffective diuresis despite increasing to 80 mg daily. She presented to the ED for worsening shortness of breath. She is also almost out of her macitentan and is worried she will run out before establishing with Dr. Judeth Horn.    She was previously followed at [facility] but can no longer make the drive there. She underwent right heart catheterization in February 2023 results below:   "Elderly lady with longstanding PAH associated with scleroderma and had been well compensated for this. In recent years some increase in mitral stenosis, and ILD and longstanding pericardial effusion Cath performed on baseline PH med of maciitentan RA 7, RV 50/8, PA 50/21 mean 32, wedge 21 Cardiac output is 6 and index of 4.1 L/min/m2.  PVR is 1.8 WU  and SVR of 10.7 WU with NIBP 141/54 mean 71  Recommendations: Chronic diastolic dysfunction and elevated left sided filling pressure with the mitral stenosis playing some role No indication for further pulmonary vasodilator. Data does not suggest that effusion is due to the [facility] with low RA pressure. No evidence for tamponade."   Pertinent  Medical History  Scleroderma  Significant Hospital Events: Including procedures, antibiotic start and stop dates in addition to other pertinent events     Interim History / Subjective:    Objective   Blood pressure (!) 123/46, pulse 83, temperature 98.2 F (36.8 C), temperature source Oral, resp. rate 17, height 5\' 3"  (1.6 m), weight 48.6 kg, SpO2 96%.        Intake/Output Summary (Last 24 hours) at 08/31/2023 1233 Last data filed at 08/31/2023 1028 Gross per 24 hour  Intake 460 ml  Output 2000 ml  Net -1540 ml   Filed Weights   08/29/23 1537 08/30/23 0625 08/31/23 0513  Weight: 49.9 kg 46.6 kg 48.6 kg    Examination: General: elderly, frail HENT: on nasal cannula Lungs: kyphosis, bibasilar crackles Cardiovascular: RRR, systolic murmur Abdomen: soft, nontender Extremities: bilateral lower extremity edema R>L, RUE is also slightly red and warm Neuro: normal speech, no focal asymmetry   Labs reviewed BNP 2100 Na 131 K 4.1 Cr 1.23  Chest xray 11/09 reviewed shows cardiomegaly, pulmonary vascular congestion, bilateral pleural effusions  CT Chest July 2023 shows early fibrotic NSIP without frank honeycombing, lower lobe predominant bronchiectasis.    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   Here for decompensated HFpEF requiring diuresis.  She is not on anti-fibrotic therapy for her -ILD due to concern for adverse effects of anti-fibrotics.  I suspect she will need instructions on twice daily diuretic dosing and will need to be monitored for adequate diuresis after transitioning to an oral agent.   She will need daily weights and strict I&Os with aggressive twice daily IV diuresis.   Regarding opsumit - not a great medication for someone with group  2-3 PH. She also has a macrocytic anemia which may be an adverse effect. There is concern the opsumit is worsening her left sided heart failure. Discussed with Dr. Judeth Horn and recommend stopping the Opsumit based on increasing frequency of Left heart failure episodes and anemia.   She has been reschedule in January with Dr. Judeth Horn.   Durel Salts, MD Pulmonary and Critical Care Medicine [facility] 08/31/2023 1:34 PM Pager: see AMION  If no response to pager, please call critical care on call (see AMION) until 7pm After 7:00 pm call Elink     Labs   CBC: Recent Labs  Lab 08/29/23 1613 08/30/23 0410 08/31/23 0422  WBC 6.7 5.7 8.5  NEUTROABS 5.0  --   --   HGB 8.5* 8.3* 8.8*  HCT 26.5* 26.7* 27.4*  MCV 111.3* 112.7* 110.5*  PLT 157 152 159    Basic Metabolic Panel: Recent Labs  Lab 08/29/23 1613 08/30/23 0410 08/31/23 0422  NA 130* 131* 131*  K 4.7 3.9 4.1  CL 91* 91* 88*  CO2 31 31 34*  GLUCOSE 95 99 95  BUN 49* 50* 61*  CREATININE 1.33* 1.06* 1.23*  CALCIUM 8.4* 8.7* 8.5*  MG 2.3  --   --    GFR: Estimated Creatinine Clearance: 25.7 mL/min (A) (by C-G formula based on SCr of 1.23 mg/dL (H)). Recent Labs  Lab 08/29/23 1613 08/30/23 0410 08/31/23 0422  WBC 6.7 5.7 8.5    Liver Function Tests: Recent Labs  Lab 08/29/23 1613  AST 27  ALT 13  ALKPHOS 54  BILITOT 1.0  PROT 6.8  ALBUMIN 3.9   No results for input(s): "LIPASE", "AMYLASE" in the last 168 hours. No results for input(s): "AMMONIA" in the last 168 hours.  ABG    Component Value Date/Time   PHART 7.357 04/25/2022 1127   PCO2ART 44.2 04/25/2022 1127   PO2ART 66 (L) 04/25/2022 1127   HCO3 24.8 04/25/2022 1127   TCO2 26 04/25/2022 1127   ACIDBASEDEF 1.0 04/25/2022 1127   O2SAT 92 04/25/2022 1127     Coagulation Profile: No results for input(s): "INR", "PROTIME" in the last 168 hours.  Cardiac Enzymes: No results for input(s): "CKTOTAL", "CKMB", "CKMBINDEX", "TROPONINI"  in the last 168 hours.  HbA1C: Hgb A1c MFr Bld  Date/Time Value Ref Range Status  01/06/2020 10:42 AM 5.2 4.6 - 6.5 % Final    Comment:    Glycemic Control Guidelines for People with Diabetes:Non Diabetic:  <6%Goal of Therapy: <7%Additional Action Suggested:  >8%     CBG: No results for input(s): "GLUCAP" in the last 168 hours.  Review of Systems:   As in HPI  Past Medical History:  She,  has a past medical history of Anemia, Angiodysplasia of stomach and duodenum, Arthus phenomenon, AVM (arteriovenous malformation), Blood transfusion without reported diagnosis, Breast cancer (HCC) (1989), Candida esophagitis (HCC), Cataract, Chronic kidney disease, Corneal edema, Corneal epithelial basement membrane dystrophy, CREST syndrome (HCC), GERD (gastroesophageal reflux disease), Interstitial lung disease (HCC), Nodule of kidney, Pericardial effusion, PONV (postoperative nausea and vomiting), Pulmonary embolus (HCC) (2003), Pulmonary hypertension (HCC), Rectal incontinence, Renal cell carcinoma (HCC), Scleroderma (HCC), Tubular adenoma of colon, and Uterine  polyp.   Surgical History:   Past Surgical History:  Procedure Laterality Date   APPENDECTOMY  1962   BREAST LUMPECTOMY  1989   left   CATARACT EXTRACTION, BILATERAL Bilateral 12/2013   COLONOSCOPY WITH PROPOFOL N/A 04/15/2021   Procedure: COLONOSCOPY WITH PROPOFOL;  Surgeon: Rachael Fee, MD;  Location: [facility];  Service: Endoscopy;  Laterality: N/A;   ENTEROSCOPY N/A 01/18/2016   Procedure: ENTEROSCOPY;  Surgeon: Napoleon Form, MD;  Location: [facility];  Service: Endoscopy;  Laterality: N/A;   ENTEROSCOPY N/A 05/24/2018   Procedure: ENTEROSCOPY;  Surgeon: Lynann Bologna, MD;  Location: [facility];  Service: Endoscopy;  Laterality: N/A;   ENTEROSCOPY N/A 03/29/2021   Procedure: ENTEROSCOPY;  Surgeon: Beverley Fiedler, MD;  Location: [facility];  Service: Gastroenterology;  Laterality: N/A;   ENTEROSCOPY N/A 04/13/2021    Procedure: ENTEROSCOPY;  Surgeon: Benancio Deeds, MD;  Location: [facility];  Service: Gastroenterology;  Laterality: N/A;   ESOPHAGOGASTRODUODENOSCOPY (EGD) WITH PROPOFOL N/A 12/21/2015   Procedure: ESOPHAGOGASTRODUODENOSCOPY (EGD) WITH PROPOFOL;  Surgeon: Hilarie Fredrickson, MD;  Location: [facility];  Service: Endoscopy;  Laterality: N/A;   HOT HEMOSTASIS N/A 05/24/2018   Procedure: HOT HEMOSTASIS (ARGON PLASMA COAGULATION/BICAP);  Surgeon: Lynann Bologna, MD;  Location: [facility];  Service: Endoscopy;  Laterality: N/A;   HOT HEMOSTASIS N/A 03/29/2021   Procedure: HOT HEMOSTASIS (ARGON PLASMA COAGULATION/BICAP);  Surgeon: Beverley Fiedler, MD;  Location: [facility];  Service: Gastroenterology;  Laterality: N/A;   HOT HEMOSTASIS N/A 04/13/2021   Procedure: HOT HEMOSTASIS (ARGON PLASMA COAGULATION/BICAP);  Surgeon: Benancio Deeds, MD;  Location: [facility];  Service: Gastroenterology;  Laterality: N/A;   INTRAMEDULLARY (IM) NAIL INTERTROCHANTERIC Left 10/11/2022   Procedure: INTRAMEDULLARY (IM) NAIL INTERTROCHANTERIC;  Surgeon: Ollen Gross, MD;  Location: [facility];  Service: Orthopedics;  Laterality: Left;   IR GENERIC HISTORICAL  06/05/2016   IR RADIOLOGIST EVAL & MGMT 06/05/2016 [name], MD [facility] INTERV RAD   IVC Filter     KIDNEY SURGERY     left -"laser surgery by Dr. Fredia Sorrow- 5 yrs ago" no removal   RIGHT/LEFT HEART CATH AND CORONARY ANGIOGRAPHY N/A 04/25/2022   Procedure: RIGHT/LEFT HEART CATH AND CORONARY ANGIOGRAPHY;  Surgeon: Tonny Bollman, MD;  Location: [facility] INVASIVE CV LAB;  Service: Cardiovascular;  Laterality: N/A;   SCHLEROTHERAPY  05/24/2018   Procedure: Theresia Majors;  Surgeon: Lynann Bologna, MD;  Location: [facility];  Service: Endoscopy;;   SUBMUCOSAL TATTOO INJECTION  04/13/2021   Procedure: SUBMUCOSAL TATTOO INJECTION;  Surgeon: Benancio Deeds, MD;  Location: [facility];  Service: Gastroenterology;;   TONSILLECTOMY     TUBAL LIGATION       Social  History:   reports that she has never smoked. She has never used smokeless tobacco. She reports that she does not drink alcohol and does not use drugs.   Family History:  Her family history includes Alzheimer's disease in her mother; Bladder Cancer in her father; Diabetes in her father and sister; Esophageal cancer in her paternal uncle; Lung cancer in her sister; Prostate cancer in her father. There is no history of Colon cancer.   Allergies Allergies  Allergen Reactions   Codeine Nausea Only    Hallucinations, too   Other Nausea And Vomiting and Other (See Comments)    "-mycin" antibiotics.   Also cause hallucinations.   Erythromycin Nausea And Vomiting   Iodinated Contrast Media Other (See Comments)    Renal issues   Lisinopril     Pt  doesn't remember reaction   Mirtazapine Other (See Comments)    "Threw me into orbit"   Mycophenolate Mofetil Nausea Only   Sulfa Antibiotics Other (See Comments)    Unknown reaction     Home Medications  Prior to Admission medications   Medication Sig Start Date End Date Taking? Authorizing Provider  acetaminophen (TYLENOL) 325 MG tablet Take 2 tablets (650 mg total) by mouth every 6 (six) hours as needed for moderate pain. 07/18/22  Yes Wallis Bamberg, PA-C  albuterol (VENTOLIN HFA) 108 (90 Base) MCG/ACT inhaler INHALE 2 PUFFS INTO THE LUNGS EVERY 6 HOURS AS NEEDED FOR WHEEZING OR SHORTNESS OF BREATH Patient taking differently: Inhale 2 puffs into the lungs every 6 (six) hours as needed for wheezing or shortness of breath. 03/13/23  Yes [name], MD  augmented betamethasone dipropionate (DIPROLENE-AF) 0.05 % cream Apply 1 application  topically 2 (two) times daily as needed (for irritation). 07/13/23  Yes [provider]  clobetasol ointment (TEMOVATE) 0.05 % Apply 1 Application topically 2 (two) times daily as needed (for irritation). 04/20/23  Yes [provider]  docusate sodium (COLACE) 100 MG capsule Take 1 capsule (100  mg total) by mouth 2 (two) times daily. Patient taking differently: Take 100 mg by mouth in the morning. 10/15/22  Yes Edmisten, Kristie L, PA  famotidine (PEPCID) 20 MG tablet Take 1 tablet (20 mg total) by mouth at bedtime. 02/10/23  Yes Nandigam, Eleonore Chiquito, MD  fluticasone (FLONASE) 50 MCG/ACT nasal spray USE 1 SPRAY(S) IN EACH NOSTRIL ONCE DAILY AS NEEDED FOR ALLERGIES Patient taking differently: Place 1 spray into both nostrils in the morning. 08/11/23  Yes [name], MD  furosemide (LASIX) 20 MG tablet Take 3 tablets (60 mg total) by mouth daily. 08/21/23  Yes Cleaver, Thomasene Ripple, NP  guaiFENesin (MUCINEX) 600 MG 12 hr tablet Take 600 mg by mouth 2 (two) times daily as needed.   Yes [provider]  ipratropium (ATROVENT) 0.03 % nasal spray Place 2 sprays into both nostrils every 12 (twelve) hours. Patient taking differently: Place 2 sprays into both nostrils at bedtime. 05/12/22  Yes Martina Sinner, MD  latanoprost (XALATAN) 0.005 % ophthalmic solution Place 1 drop into both eyes at bedtime. 09/23/22  Yes [provider]  magic mouthwash SOLN Take 10 mLs by mouth 4 (four) times daily as needed for mouth pain. 03/22/22  Yes Adhikari, Willia Craze, MD  montelukast (SINGULAIR) 10 MG tablet TAKE 1 TABLET BY MOUTH AT BEDTIME Patient taking differently: Take 10 mg by mouth at bedtime. TAKE 1 TABLET BY MOUTH AT BEDTIME 07/31/23  Yes [name], MD  mupirocin ointment (BACTROBAN) 2 % Apply 1 Application topically 2 (two) times daily as needed (for irritation).   Yes [provider]  Nutritional Supplements (ENSURE HIGH PROTEIN) LIQD Take 0.5 Bottles by mouth daily as needed (for supplementation).   Yes [provider]  OPSUMIT 10 MG tablet Take 1 tablet (10 mg total) by mouth daily. 07/07/23  Yes Parrett, Tammy S, NP  pantoprazole (PROTONIX) 40 MG tablet Take 1 tablet (40 mg total) by mouth daily. Patient taking differently: Take 40 mg by mouth daily as  needed (for acid reflux). 02/10/23  Yes Nandigam, Eleonore Chiquito, MD  polyethylene glycol (MIRALAX / GLYCOLAX) 17 g packet Take 17 g by mouth daily. 10/15/22  Yes Edmisten, Kristie L, PA  spironolactone (ALDACTONE) 25 MG tablet Take 0.5 tablets (12.5 mg total) by mouth daily. 08/15/23  Yes Lewie Chamber,  MD  sucralfate (CARAFATE) 1 GM/10ML suspension TAKE 10 MLS BY MOUTH EVERY 6 HOURS AS NEEDED FOR REFLUX Patient taking differently: Take 1 g by mouth every 6 (six) hours as needed (to coat the stomach). 02/11/23  Yes Nandigam, Eleonore Chiquito, MD  triamcinolone cream (KENALOG) 0.1 % Apply 1 Application topically See admin instructions. Apply to affected leg(s) after showers and up to 2 additional times a day as needed for irritation 07/07/23  Yes [provider]  empagliflozin (JARDIANCE) 10 MG TABS tablet Take 1 tablet (10 mg total) by mouth daily. Patient not taking: Reported on 08/29/2023 08/16/23   Lewie Chamber, MD  mirtazapine (REMERON) 15 MG tablet Take 1 tablet (15 mg total) by mouth at bedtime. Patient not taking: Reported on 08/21/2023 03/13/23   [name], MD  OXYGEN Inhale 2-3 L/min into the lungs continuous.    [provider]     Critical care time:

## 2023-08-31 NOTE — Progress Notes (Signed)
PROGRESS NOTE  [name]:[id number] DOB: [birth year] DOA: 08/29/2023 PCP: [name], MD  HPI/Recap of past 24 hours: Paula Ward is a 87 y.o. female with medical history significant for hypertension, CKD 3B, interstitial lung disease, pulmonary hypertension, chronic hypoxic respiratory failure on 3L of O2, chronic anemia, and chronic diastolic CHF who presents with shortness of breath, weight gain, and orthopnea for about 1 week. Due to this, patient called her cardiology office, and was advised to go to the ED. In the ED, patient is found to be afebrile and saturating well on 3 L of O2, other VSS. EKG demonstrates SR with first-degree AV nodal block.  Chest x-ray is notable for cardiomegaly, mild CHF, and pleural effusions.  Labs are most notable for sodium 130, creatinine 1.33, negative respiratory virus panel, and hemoglobin 8.5. Pt admitted for further management.    Today, patient denies any new complaints, still reports some shortness of breath as well as reflux.  Requesting pulmonology consult as she missed her appointment today on 11/11 with Dr. Judeth Horn for pulmonary hypertension.  Noted some mild erythema on the right LE     Assessment/Plan: Principal Problem:   Acute on chronic diastolic (congestive) heart failure (HCC) Active Problems:   Chronic respiratory failure with hypoxia and hypercapnia (HCC)   Chronic kidney disease, stage 3b (HCC)   Macrocytic anemia   Essential hypertension   Hyponatremia   Acute heart failure with preserved ejection fraction (HFpEF) (HCC)   Dependence on supplemental oxygen   Elevated troponin I level   Nonrheumatic aortic valve stenosis   Scleroderma (HCC)   Chronic pericardial effusion   Acute on chronic diastolic CHF  Large pericardial effusion- currently not in tamponade BNP 2,168 around baseline, SOB ECHO from 07/2023: EF was >75% with grade 1DD, severe LAE, large pericardial effusion, and mild AS Continue  diuresis with IV Lasix, continue aldactone Cardiology consulted, appreciate recs Monitor weight and I/Os Daily BMP  Chronic hypoxic respiratory failure- on 3L of O2 at baseline  ILD Pulm HTN Continue supplemental O2, currently on 3L (baseline) Pulmonology consulted, appreciate recs, recommend stopping Opsumit based on increasing frequency of left heart failure episodes and anemia  Hyponatremia  Likely in setting of hypervolemia  Daily BMP   CKD 3B  Cr around baseline Daily BMP   Macrocytic/Anemia of chronic kidney disease Stable Daily CBC       Estimated body mass index is 18.98 kg/m as calculated from the following:   Height as of this encounter: 5\' 3"  (1.6 m).   Weight as of this encounter: 48.6 kg.     Code Status: Full  Family Communication: None at bedside   Disposition Plan: Status is: Inpatient Remains inpatient appropriate because: Level of care      Consultants: Cardiology Pulmonology  Procedures: None  Antimicrobials: None  DVT prophylaxis:  Lovenox   Objective: Vitals:   08/30/23 2101 08/31/23 0513 08/31/23 0516 08/31/23 1303  BP: (!) 129/48  (!) 123/46 124/62  Pulse: 86  83 87  Resp: 17  17 20   Temp: 98.1 F (36.7 C)  98.2 F (36.8 C) 97.8 F (36.6 C)  TempSrc: Oral  Oral Oral  SpO2: 96%  96% 100%  Weight:  48.6 kg    Height:        Intake/Output Summary (Last 24 hours) at 08/31/2023 1750 Last data filed at 08/31/2023 1559 Gross per 24 hour  Intake 720 ml  Output 1950 ml  Net -1230 ml  Filed Weights   08/29/23 1537 08/30/23 0625 08/31/23 0513  Weight: 49.9 kg 46.6 kg 48.6 kg    Exam: General: NAD  Cardiovascular: S1, S2 present Respiratory: Bibasilar crackles noted Abdomen: Soft, nontender, nondistended, bowel sounds present Musculoskeletal: Trace bilateral pedal edema noted Skin: Normal Psychiatry: Normal mood     Data Reviewed: CBC: Recent Labs  Lab 08/29/23 1613 08/30/23 0410 08/31/23 0422  WBC 6.7  5.7 8.5  NEUTROABS 5.0  --   --   HGB 8.5* 8.3* 8.8*  HCT 26.5* 26.7* 27.4*  MCV 111.3* 112.7* 110.5*  PLT 157 152 159   Basic Metabolic Panel: Recent Labs  Lab 08/29/23 1613 08/30/23 0410 08/31/23 0422  NA 130* 131* 131*  K 4.7 3.9 4.1  CL 91* 91* 88*  CO2 31 31 34*  GLUCOSE 95 99 95  BUN 49* 50* 61*  CREATININE 1.33* 1.06* 1.23*  CALCIUM 8.4* 8.7* 8.5*  MG 2.3  --   --    GFR: Estimated Creatinine Clearance: 25.7 mL/min (A) (by C-G formula based on SCr of 1.23 mg/dL (H)). Liver Function Tests: Recent Labs  Lab 08/29/23 1613  AST 27  ALT 13  ALKPHOS 54  BILITOT 1.0  PROT 6.8  ALBUMIN 3.9   No results for input(s): "LIPASE", "AMYLASE" in the last 168 hours. No results for input(s): "AMMONIA" in the last 168 hours. Coagulation Profile: No results for input(s): "INR", "PROTIME" in the last 168 hours. Cardiac Enzymes: No results for input(s): "CKTOTAL", "CKMB", "CKMBINDEX", "TROPONINI" in the last 168 hours. BNP (last 3 results) Recent Labs    05/13/23 1507 06/03/23 1200  PROBNP 1,750.0* 2,680.0*   HbA1C: No results for input(s): "HGBA1C" in the last 72 hours. CBG: No results for input(s): "GLUCAP" in the last 168 hours. Lipid Profile: No results for input(s): "CHOL", "HDL", "LDLCALC", "TRIG", "CHOLHDL", "LDLDIRECT" in the last 72 hours. Thyroid Function Tests: No results for input(s): "TSH", "T4TOTAL", "FREET4", "T3FREE", "THYROIDAB" in the last 72 hours. Anemia Panel: No results for input(s): "VITAMINB12", "FOLATE", "FERRITIN", "TIBC", "IRON", "RETICCTPCT" in the last 72 hours. Urine analysis:    Component Value Date/Time   COLORURINE YELLOW 03/13/2023 1512   APPEARANCEUR CLEAR 03/13/2023 1512   LABSPEC 1.020 03/13/2023 1512   PHURINE 5.5 03/13/2023 1512   GLUCOSEU NEGATIVE 03/13/2023 1512   HGBUR NEGATIVE 03/13/2023 1512   BILIRUBINUR neg 08/03/2023 1351   KETONESUR negative 04/03/2023 1532   KETONESUR TRACE (A) 03/13/2023 1512   PROTEINUR  Negative 08/03/2023 1351   PROTEINUR NEGATIVE 03/30/2021 1416   UROBILINOGEN negative (A) 08/03/2023 1351   UROBILINOGEN 1.0 03/13/2023 1512   NITRITE neg 08/03/2023 1351   NITRITE NEGATIVE 03/13/2023 1512   LEUKOCYTESUR Negative 08/03/2023 1351   LEUKOCYTESUR NEGATIVE 03/13/2023 1512   Sepsis Labs: @LABRCNTIP (procalcitonin:4,lacticidven:4)  ) Recent Results (from the past 240 hour(s))  Resp panel by RT-PCR (RSV, Flu A&B, Covid) Anterior Nasal Swab     Status: None   Collection Time: 08/29/23  4:25 PM   Specimen: Anterior Nasal Swab  Result Value Ref Range Status   SARS Coronavirus 2 by RT PCR NEGATIVE NEGATIVE Final    Comment: (NOTE) SARS-CoV-2 target nucleic acids are NOT DETECTED.  The SARS-CoV-2 RNA is generally detectable in upper respiratory specimens during the acute phase of infection. The lowest concentration of SARS-CoV-2 viral copies this assay can detect is 138 copies/mL. A negative result does not preclude SARS-Cov-2 infection and should not be used as the sole basis for treatment or other patient management decisions. A  negative result may occur with  improper specimen collection/handling, submission of specimen other than nasopharyngeal swab, presence of viral mutation(s) within the areas targeted by this assay, and inadequate number of viral copies(<138 copies/mL). A negative result must be combined with clinical observations, patient history, and epidemiological information. The expected result is Negative.  Fact Sheet for Patients:  BloggerCourse.com  Fact Sheet for Healthcare Providers:  SeriousBroker.it  This test is no t yet approved or cleared by the [place] FDA and  has been authorized for detection and/or diagnosis of SARS-CoV-2 by FDA under an Emergency Use Authorization (EUA). This EUA will remain  in effect (meaning this test can be used) for the duration of the COVID-19 declaration under  Section 564(b)(1) of the Act, 21 U.S.C.section 360bbb-3(b)(1), unless the authorization is terminated  or revoked sooner.       Influenza A by PCR NEGATIVE NEGATIVE Final   Influenza B by PCR NEGATIVE NEGATIVE Final    Comment: (NOTE) The Xpert Xpress SARS-CoV-2/FLU/RSV plus assay is intended as an aid in the diagnosis of influenza from Nasopharyngeal swab specimens and should not be used as a sole basis for treatment. Nasal washings and aspirates are unacceptable for Xpert Xpress SARS-CoV-2/FLU/RSV testing.  Fact Sheet for Patients: BloggerCourse.com  Fact Sheet for Healthcare Providers: SeriousBroker.it  This test is not yet approved or cleared by the [place] FDA and has been authorized for detection and/or diagnosis of SARS-CoV-2 by FDA under an Emergency Use Authorization (EUA). This EUA will remain in effect (meaning this test can be used) for the duration of the COVID-19 declaration under Section 564(b)(1) of the Act, 21 U.S.C. section 360bbb-3(b)(1), unless the authorization is terminated or revoked.     Resp Syncytial Virus by PCR NEGATIVE NEGATIVE Final    Comment: (NOTE) Fact Sheet for Patients: BloggerCourse.com  Fact Sheet for Healthcare Providers: SeriousBroker.it  This test is not yet approved or cleared by the [place] FDA and has been authorized for detection and/or diagnosis of SARS-CoV-2 by FDA under an Emergency Use Authorization (EUA). This EUA will remain in effect (meaning this test can be used) for the duration of the COVID-19 declaration under Section 564(b)(1) of the Act, 21 U.S.C. section 360bbb-3(b)(1), unless the authorization is terminated or revoked.  Performed at [facility], [address], [place], [place] [address]       Studies: ECHOCARDIOGRAM LIMITED  Result Date: 08/31/2023    ECHOCARDIOGRAM  LIMITED REPORT   Patient Name:   TIMAYA GOSA Date of Exam: 08/31/2023 Medical Rec #:  [id number]       Height:       63.0 in Accession #:    [id number]      Weight:       107.1 lb Date of Birth:  [birth year]       BSA:          1.483 m Patient Age:    87 years        BP:           124/62 mmHg Patient Gender: F               HR:           85 bpm. Exam Location:  Inpatient Procedure: Limited Echo, Limited Color Doppler and Cardiac Doppler Indications:    Pericardial effusion  History:        Patient has prior history of Echocardiogram examinations, most  recent 08/15/2023. CHF, Pericardial Disease, Pulmonary HTN,                 Aortic Valve Disease; Signs/Symptoms:Shortness of Breath.                 Scleroderma, CA, hx of PE, CREST syndrome, CKD, ILD.  Sonographer:    Milda Smart Referring Phys: [id number] QIONG [id number]  Sonographer Comments: Image acquisition challenging due to patient body habitus. IMPRESSIONS  1. Left ventricular ejection fraction, by estimation, is 60 to 65%. The left ventricle has normal function. The left ventricle has no regional wall motion abnormalities.  2. Right ventricular systolic function is normal. The right ventricular size is normal.  3. Left atrial size was mildly dilated.  4. Severe mitral annular calcification. Visually there is mitral stenosis but doppler interrogation was not done.  5. The aortic valve is tricuspid. There is severe calcifcation of the aortic valve. Visually there is aortic stenosis but doppler interrogation was not done.  6. The inferior vena cava is normal in size with <50% respiratory variability, suggesting right atrial pressure of 8 mmHg.  7. There was a large circumferential pericardial effusion. There was no RV diastolic collapse noted. Mitral E inflow velocity showed < 25% respirophasic variation. The IVC was not significantly dilated. There does not appear to be tamponade physiology.  8. Limited echo. FINDINGS  Left Ventricle: Left  ventricular ejection fraction, by estimation, is 60 to 65%. The left ventricle has normal function. The left ventricle has no regional wall motion abnormalities. The left ventricular internal cavity size was normal in size. Right Ventricle: The right ventricular size is normal. No increase in right ventricular wall thickness. Right ventricular systolic function is normal. Left Atrium: Left atrial size was mildly dilated. Right Atrium: Right atrial size was normal in size. Pericardium: There was a large circumferential pericardial effusion. There was no RV diastolic collapse noted. Mitral E inflow velocity showed < 25% respirophasic variation. The IVC was not significantly dilated. There does not appear to be tamponade physiology. Mitral Valve: There is moderate calcification of the mitral valve leaflet(s). Severe mitral annular calcification. Aortic Valve: The aortic valve is tricuspid. There is severe calcifcation of the aortic valve. Venous: The inferior vena cava is normal in size with less than 50% respiratory variability, suggesting right atrial pressure of 8 mmHg. Additional Comments: Spectral Doppler performed. Color Doppler performed.  IVC IVC diam: 1.50 cm Dalton McleanMD Electronically signed by Wilfred Lacy Signature Date/Time: 08/31/2023/4:29:29 PM    Final     Scheduled Meds:  docusate sodium  100 mg Oral BID   empagliflozin  10 mg Oral Daily   enoxaparin (LOVENOX) injection  30 mg Subcutaneous Daily   famotidine  20 mg Oral QHS   fluticasone  1 spray Each Nare q AM   furosemide  40 mg Intravenous BID   montelukast  10 mg Oral QHS   sodium chloride flush  3 mL Intravenous Q12H   spironolactone  12.5 mg Oral Daily    Continuous Infusions:   LOS: 2 days     Briant Cedar, MD [facility]  If 7PM-7AM, please contact night-coverage [email or web address] 08/31/2023, 5:50 PM

## 2023-09-01 DIAGNOSIS — I2729 Other secondary pulmonary hypertension: Secondary | ICD-10-CM | POA: Diagnosis not present

## 2023-09-01 DIAGNOSIS — I2721 Secondary pulmonary arterial hypertension: Secondary | ICD-10-CM

## 2023-09-01 DIAGNOSIS — I5031 Acute diastolic (congestive) heart failure: Secondary | ICD-10-CM | POA: Diagnosis not present

## 2023-09-01 DIAGNOSIS — I3139 Other pericardial effusion (noninflammatory): Secondary | ICD-10-CM | POA: Diagnosis not present

## 2023-09-01 DIAGNOSIS — Z9981 Dependence on supplemental oxygen: Secondary | ICD-10-CM | POA: Diagnosis not present

## 2023-09-01 DIAGNOSIS — I5033 Acute on chronic diastolic (congestive) heart failure: Secondary | ICD-10-CM | POA: Diagnosis not present

## 2023-09-01 DIAGNOSIS — J849 Interstitial pulmonary disease, unspecified: Secondary | ICD-10-CM | POA: Diagnosis not present

## 2023-09-01 DIAGNOSIS — I10 Essential (primary) hypertension: Secondary | ICD-10-CM

## 2023-09-01 LAB — CBC
HCT: 27 % — ABNORMAL LOW (ref 36.0–46.0)
Hemoglobin: 8.5 g/dL — ABNORMAL LOW (ref 12.0–15.0)
MCH: 34.8 pg — ABNORMAL HIGH (ref 26.0–34.0)
MCHC: 31.5 g/dL (ref 30.0–36.0)
MCV: 110.7 fL — ABNORMAL HIGH (ref 80.0–100.0)
Platelets: 155 10*3/uL (ref 150–400)
RBC: 2.44 MIL/uL — ABNORMAL LOW (ref 3.87–5.11)
RDW: 13.5 % (ref 11.5–15.5)
WBC: 7.2 10*3/uL (ref 4.0–10.5)
nRBC: 0 % (ref 0.0–0.2)

## 2023-09-01 LAB — BASIC METABOLIC PANEL
Anion gap: 10 (ref 5–15)
BUN: 49 mg/dL — ABNORMAL HIGH (ref 8–23)
CO2: 33 mmol/L — ABNORMAL HIGH (ref 22–32)
Calcium: 8.6 mg/dL — ABNORMAL LOW (ref 8.9–10.3)
Chloride: 91 mmol/L — ABNORMAL LOW (ref 98–111)
Creatinine, Ser: 1.02 mg/dL — ABNORMAL HIGH (ref 0.44–1.00)
GFR, Estimated: 54 mL/min — ABNORMAL LOW (ref >60)
Glucose, Bld: 90 mg/dL (ref 70–99)
Potassium: 3.8 mmol/L (ref 3.5–5.1)
Sodium: 134 mmol/L — ABNORMAL LOW (ref 135–145)

## 2023-09-01 MED ORDER — FUROSEMIDE 40 MG PO TABS
40.0000 mg | ORAL_TABLET | Freq: Every day | ORAL | Status: DC
  Administered 2023-09-02 – 2023-09-03 (×2): 40 mg via ORAL
  Filled 2023-09-01 (×2): qty 1

## 2023-09-01 MED ORDER — DILTIAZEM HCL 30 MG PO TABS
30.0000 mg | ORAL_TABLET | Freq: Two times a day (BID) | ORAL | Status: DC
  Administered 2023-09-01 – 2023-09-03 (×4): 30 mg via ORAL
  Filled 2023-09-01 (×4): qty 1

## 2023-09-01 MED ORDER — LATANOPROST 0.005 % OP SOLN
1.0000 [drp] | Freq: Every day | OPHTHALMIC | Status: AC
  Administered 2023-09-01 – 2023-09-02 (×2): 1 [drp] via OPHTHALMIC
  Filled 2023-09-01: qty 2.5

## 2023-09-01 MED ORDER — ALUM & MAG HYDROXIDE-SIMETH 200-200-20 MG/5ML PO SUSP
30.0000 mL | ORAL | Status: DC | PRN
  Administered 2023-09-01 – 2023-09-02 (×2): 30 mL via ORAL
  Filled 2023-09-01 (×3): qty 30

## 2023-09-01 NOTE — Progress Notes (Signed)
Mobility Specialist - Progress Note  Pre-mobility: 103 bpm HR, 96% SpO2 During mobility: 124 bpm HR, 90% SpO2 Post-mobility: 94 bpm HR, 95% SPO2   09/01/23 1630  Mobility  Activity Ambulated with assistance in hallway  Level of Assistance Contact guard assist, steadying assist  Assistive Device Cane  Distance Ambulated (ft) 140 ft  Range of Motion/Exercises Active  Activity Response Tolerated well  Mobility Referral Yes  $Mobility charge 1 Mobility  Mobility Specialist Start Time (ACUTE ONLY) 1610  Mobility Specialist Stop Time (ACUTE ONLY) 1630  Mobility Specialist Time Calculation (min) (ACUTE ONLY) 20 min   Pt was found on recliner chair and agreeable to ambulate. Upset about lossing L foot lift from shoe. No complaints during session. Was a little unsteady during session. At EOS returned to bed with all needs met. Call bell in reach.    Billey Chang Mobility Specialist

## 2023-09-01 NOTE — [place] Notes (Addendum)
Progress Note  Patient Name: Paula Ward Date of Encounter: 09/01/2023  Primary Cardiologist: Olga Millers, MD  Subjective   Reports she just woke up so is still getting her bearings as far as how she's doing. Edema much improved. Still SOB at times but reports improvement since admission.  Inpatient Medications    Scheduled Meds:  diltiazem  30 mg Oral Q12H   docusate sodium  100 mg Oral BID   empagliflozin  10 mg Oral Daily   enoxaparin (LOVENOX) injection  30 mg Subcutaneous Daily   famotidine  20 mg Oral QHS   fluticasone  1 spray Each Nare q AM   [START ON 09/02/2023] furosemide  40 mg Oral Daily   montelukast  10 mg Oral QHS   sodium chloride flush  3 mL Intravenous Q12H   spironolactone  12.5 mg Oral Daily   Continuous Infusions:  PRN Meds: acetaminophen **OR** acetaminophen, albuterol, alum & mag hydroxide-simeth, ondansetron **OR** ondansetron (ZOFRAN) IV, senna-docusate, sucralfate   Vital Signs    Vitals:   08/31/23 1303 08/31/23 2059 09/01/23 0312 09/01/23 1100  BP: 124/62 (!) 123/46 (!) 119/49 (!) 124/55  Pulse: 87 88 84 93  Resp: 20 16 16    Temp: 97.8 F (36.6 C) 97.9 F (36.6 C) 98 F (36.7 C) 98.8 F (37.1 C)  TempSrc: Oral Oral Oral Oral  SpO2: 100% 95% 95% 98%  Weight:    44.7 kg  Height:        Intake/Output Summary (Last 24 hours) at 09/01/2023 1644 Last data filed at 09/01/2023 1500 Gross per 24 hour  Intake 360 ml  Output 1000 ml  Net -640 ml      09/01/2023   11:00 AM 08/31/2023    5:13 AM 08/30/2023    6:25 AM  Last 3 Weights  Weight (lbs) 98 lb 8.7 oz 107 lb 2.3 oz 102 lb 12.8 oz  Weight (kg) 44.7 kg 48.6 kg 46.63 kg     Telemetry    NSR, first degree AVB - Personally Reviewed  Physical Exam   GEN: No acute distress.  HEENT: Normocephalic, atraumatic, sclera non-icteric. Neck: No JVD or bruits. Cardiac: RRR, 2/6 SEM best heard RUSB, no rubs or gallops.  Respiratory: Coarse crackly BS bilaterally. Breathing is  unlabored. GI: Soft, nontender, non-distended, BS +x 4. MS: no deformity. Extremities: No clubbing or cyanosis. No edema. Distal pedal pulses are 2+ and equal bilaterally. Neuro:  AAOx3. Follows commands. Psych:  Responds to questions appropriately with a normal affect.  Labs    High Sensitivity Troponin:   Recent Labs  Lab 08/14/23 0636 08/14/23 0828 08/29/23 1613 08/29/23 2133  TROPONINIHS 26* 27* 22* 23*      Cardiac EnzymesNo results for input(s): "TROPONINI" in the last 168 hours. No results for input(s): "TROPIPOC" in the last 168 hours.   Chemistry Recent Labs  Lab 08/29/23 1613 08/30/23 0410 08/31/23 0422 09/01/23 0405  NA 130* 131* 131* 134*  K 4.7 3.9 4.1 3.8  CL 91* 91* 88* 91*  CO2 31 31 34* 33*  GLUCOSE 95 99 95 90  BUN 49* 50* 61* 49*  CREATININE 1.33* 1.06* 1.23* 1.02*  CALCIUM 8.4* 8.7* 8.5* 8.6*  PROT 6.8  --   --   --   ALBUMIN 3.9  --   --   --   AST 27  --   --   --   ALT 13  --   --   --   ALKPHOS 54  --   --   --  BILITOT 1.0  --   --   --   GFRNONAA 39* 51* 43* 54*  ANIONGAP 8 9 9 10      Hematology Recent Labs  Lab 08/30/23 0410 08/31/23 0422 09/01/23 0405  WBC 5.7 8.5 7.2  RBC 2.37* 2.48* 2.44*  HGB 8.3* 8.8* 8.5*  HCT 26.7* 27.4* 27.0*  MCV 112.7* 110.5* 110.7*  MCH 35.0* 35.5* 34.8*  MCHC 31.1 32.1 31.5  RDW 13.2 13.4 13.5  PLT 152 159 155    BNP Recent Labs  Lab 08/29/23 1613  BNP 2,168.5*     DDimer No results for input(s): "DDIMER" in the last 168 hours.   Radiology    ECHOCARDIOGRAM LIMITED  Result Date: 08/31/2023    ECHOCARDIOGRAM LIMITED REPORT   Patient Name:   KHENADI KORCH Date of Exam: 08/31/2023 Medical Rec #:  [id number]       Height:       63.0 in Accession #:    [id number]      Weight:       107.1 lb Date of Birth:  08-07-1936       BSA:          1.483 m Patient Age:    87 years        BP:           124/62 mmHg Patient Gender: F               HR:           85 bpm. Exam Location:  Inpatient  Procedure: Limited Echo, Limited Color Doppler and Cardiac Doppler Indications:    Pericardial effusion  History:        Patient has prior history of Echocardiogram examinations, most                 recent 08/15/2023. CHF, Pericardial Disease, Pulmonary HTN,                 Aortic Valve Disease; Signs/Symptoms:Shortness of Breath.                 Scleroderma, CA, hx of PE, CREST syndrome, CKD, ILD.  Sonographer:    Milda Smart Referring Phys: [id number] HQION  Sonographer Comments: Image acquisition challenging due to patient body habitus. IMPRESSIONS  1. Left ventricular ejection fraction, by estimation, is 60 to 65%. The left ventricle has normal function. The left ventricle has no regional wall motion abnormalities.  2. Right ventricular systolic function is normal. The right ventricular size is normal.  3. Left atrial size was mildly dilated.  4. Severe mitral annular calcification. Visually there is mitral stenosis but doppler interrogation was not done.  5. The aortic valve is tricuspid. There is severe calcifcation of the aortic valve. Visually there is aortic stenosis but doppler interrogation was not done.  6. The inferior vena cava is normal in size with <50% respiratory variability, suggesting right atrial pressure of 8 mmHg.  7. There was a large circumferential pericardial effusion. There was no RV diastolic collapse noted. Mitral E inflow velocity showed < 25% respirophasic variation. The IVC was not significantly dilated. There does not appear to be tamponade physiology.  8. Limited echo. FINDINGS  Left Ventricle: Left ventricular ejection fraction, by estimation, is 60 to 65%. The left ventricle has normal function. The left ventricle has no regional wall motion abnormalities. The left ventricular internal cavity size was normal in size. Right Ventricle: The right ventricular size is normal. No increase in right  ventricular wall thickness. Right ventricular systolic function is normal. Left  Atrium: Left atrial size was mildly dilated. Right Atrium: Right atrial size was normal in size. Pericardium: There was a large circumferential pericardial effusion. There was no RV diastolic collapse noted. Mitral E inflow velocity showed < 25% respirophasic variation. The IVC was not significantly dilated. There does not appear to be tamponade physiology. Mitral Valve: There is moderate calcification of the mitral valve leaflet(s). Severe mitral annular calcification. Aortic Valve: The aortic valve is tricuspid. There is severe calcifcation of the aortic valve. Venous: The inferior vena cava is normal in size with less than 50% respiratory variability, suggesting right atrial pressure of 8 mmHg. Additional Comments: Spectral Doppler performed. Color Doppler performed.  IVC IVC diam: 1.50 cm Dalton McleanMD Electronically signed by Wilfred Lacy Signature Date/Time: 08/31/2023/4:29:29 PM    Final     Cardiac Studies   Limited echo 08/31/23   1. Left ventricular ejection fraction, by estimation, is 60 to 65%. The  left ventricle has normal function. The left ventricle has no regional  wall motion abnormalities.   2. Right ventricular systolic function is normal. The right ventricular  size is normal.   3. Left atrial size was mildly dilated.   4. Severe mitral annular calcification. Visually there is mitral stenosis  but doppler interrogation was not done.   5. The aortic valve is tricuspid. There is severe calcifcation of the  aortic valve. Visually there is aortic stenosis but doppler interrogation  was not done.   6. The inferior vena cava is normal in size with <50% respiratory  variability, suggesting right atrial pressure of 8 mmHg.   7. There was a large circumferential pericardial effusion. There was no  RV diastolic collapse noted. Mitral E inflow velocity showed < 25%  respirophasic variation. The IVC was not significantly dilated. There does  not appear to be tamponade physiology.    8. Limited echo.   2d echo 08/15/23     1. Left ventricular ejection fraction, by estimation, is >75%. The left  ventricle has hyperdynamic function. The left ventricle has no regional  wall motion abnormalities. There is severe left ventricular hypertrophy.  Left ventricular diastolic  parameters are consistent with Grade I diastolic dysfunction (impaired  relaxation).   2. Right ventricular systolic function is normal. The right ventricular  size is normal.   3. Left atrial size was severely dilated.   4. Large pericardial effusion. The pericardial effusion is  circumferential. There is no evidence of cardiac tamponade.   5. The mitral valve is normal in structure. No evidence of mitral valve  regurgitation. No evidence of mitral stenosis. Severe mitral annular  calcification.   6. The aortic valve is normal in structure. There is severe calcifcation  of the aortic valve. There is moderate thickening of the aortic valve.  Aortic valve regurgitation is not visualized. Mild aortic valve stenosis.  Aortic valve mean gradient measures   14.8 mmHg. Aortic valve Vmax measures 2.59 m/s.   7. The inferior vena cava is normal in size with greater than 50%  respiratory variability, suggesting right atrial pressure of 3 mmHg.   Comparison(s): Large pericardial effusion noted on prior ECHO, no major  change.   Conclusion(s)/Recommendation(s): Findings consistent with hypertrophic  cardiomyopathy.   Patient Profile     87 y.o. female with complex history of chronic HFpEF, large pericardial effusion dating back to 2003 per notes, HTN, mild-moderate aortic stenosis, pulmonary HTN (previously followed at [facility], now [facility]) felt secondary to scleroderma with possible contributions of interstitial lung disease with chronic hypoxic respiratory failure + aortic stenosis, CKD 3b, left renal cell carcinoma s/p remote cryoablation 2012, orthostatic hypotension, h/o PE 2003, IVC filter 2009 at time  of GIB, recurrent GI bleeding, AVMs, chronic anemia followed by heme-onc, B12 deficiency, breast CA. Recent admission last month for a/c CHF and possible cellulitis. Readmitted with weight gain, orthopnea, edema with CXR showing improved but persistent CHF, small pleural effusions.  Assessment & Plan    1. Acute on chronic HFpEF, multifactorial, with severe LVH, concomitant ILD with chronic respiratory failure on home O2 + pulmonary HTN due to scleroderma - treated with IV diuresis with net negative 2.9L thus far. Clinically improving. Not weighed yet today, will request. Addendum: weight is down to 98.55lb today, suspect dry weight. Will hold on further IV Lasix pending MD reassessment this afternoon - SGLT2i restarted 08/31/23 - continued on spironolactone - given finding of severe LVH on echo 07/2023, will review with MD if additional imaging needed going forward (I.e. to r/o infiltrative cardiomyopathy)  - needs close OP f/u for [facility] - chart seems that patient is intended to be on this but recent issues noted in pulm phone notes with specialty pharmacy filling. See note from 09/01/2023 from pulmonary medicine.   2. Chronic large pericardial effusion - longstanding for patient with repeat limited echo yesterday showing no evidence of cardiac tamponade, follow clinically  3. Aortic stenosis - moderate by 2023 cath, mild on 07/2023 echo, follow clinicially  4. Acute on chronic macrocytic anemia - Hgb 8's, down from 9's, follows with heme-onc as outpatient - per primary team  5. Minimally elevated troponin - suspected demand ischemia, do not suspect ACS  6. CKD 3b with hyponatremia - stable today  Patient asking about plan for DC. She has tentative f/u already scheduled in 2 days with Edd Fabian, NP in our [place] office. I will leave this alone pending Dr. Emelda Brothers review of plan.  For questions or updates, please contact [facility] Please consult [email or web address] for contact  info under Cardiology/STEMI.  Signed, Laurann Montana, PA-C 09/01/2023, 7:39 AM    ADDENDUM:   Patient seen and examined with APP, Dayna Dunn.  I personally taken a history, examined the patient, reviewed relevant notes,  laboratory data / imaging studies.  I performed a substantive portion of this encounter and formulated the important aspects of the plan.  I agree with the APP's note, impression, and recommendations; however, I have edited the note to reflect changes or salient points (which are noted in italics).   Patient seen and examined at bedside. Walked around the unit Her weights are back to baseline. We discussed discharge planning-patient states that she was prematurely discharged during last office visit prompting her readmission.  She wants to be on her home medications for at least a day prior to being discharged   PHYSICAL EXAM:    09/01/2023   11:00 AM 09/01/2023    3:12 AM 08/31/2023    8:59 PM  Vitals with BMI  Weight 98 lbs 9 oz    BMI 17.46    Systolic 124 119 161  Diastolic 55 49 46  Pulse 93 84 88    Net IO Since Admission: -2,790 mL [09/01/23 1644]  Filed Weights   08/30/23 0625 08/31/23 0513 09/01/23 1100  Weight: 46.6 kg 48.6 kg 44.7 kg    Physical Exam  Constitutional: No distress.  hemodynamically stable  Neck: No JVD  present.  Cardiovascular: Normal rate, regular rhythm, S1 normal and S2 normal. Exam reveals no gallop, no S3 and no S4.  Murmur heard. Midsystolic murmur is present with a grade of 3/6 at the upper right sternal border. Rumbling diastolic murmur is present with a grade of 3/4 at the apex. Pulmonary/Chest: Effort normal. No stridor. She has no wheezes. She has bibasilar rales.  Abdominal: Soft. Bowel sounds are normal. She exhibits no distension. There is no abdominal tenderness.  Musculoskeletal:        General: No edema.     Cervical back: Neck supple.  Neurological: She is alert and oriented to person, place, and time. She  has intact cranial nerves (2-12).  Skin: Skin is warm.    12 lead EKG was personally reviewed by me: No new EKG  Telemetry: Normal sinus rhythm without ectopy   Assessment/Plan: Acute on chronic HFpEF, multifactorial. Chronic large pericardial effusion. Pulm hypertension due to scleroderma with concomitant ILD Aortic stenosis, moderate by 2023 Mildly elevated high sensitive troponins-supply/demand ischemia  Recommendations: Acute on chronic HFpEF Stage C, NYHA class II Weights are back to her home weights. Discontinue IV Lasix transition to Lasix 40 mg p.o. daily Continue spironolactone 12.5 mg p.o. daily. Continue Jardiance 10 mg p.o. daily. Will start Cardizem 30 mg p.o. twice daily with holding parameters -given her ventricular rate and echo findings. Consider starting ARB as outpatient if hemodynamics allow. We discussed being discharged later today-patient prefers to stay an additional day to make sure oral medications will not precipitate fluid retention. Discharge planning per primary team She has an appointment with outpatient cardiology in 2 days.  Chronic large pericardial effusion: Noted on multiple echocardiograms in the past. No tamponade physiology  Pulmonary arterial hypertension: Scleroderma: ILD Reemphasized importance of oxygen supplementation. Pulm arterial hypertension medications are currently being managed by pulmonary medicine-please refer to their note from today  Aortic stenosis: Asymptomatic. Monitor.  Further recommendations to follow as the case evolves.  Cardiology will follow peripherally.  Please reach out if any questions or concerns arise in the interim.   This note was created using a voice recognition software as a result there may be grammatical errors inadvertently enclosed that do not reflect the nature of this encounter. Every attempt is made to correct such errors.   Tessa Lerner, DO, [facility]  [facility]  [address] [place], [place] [address] 09/01/2023 4:44 PM

## 2023-09-01 NOTE — TOC Initial Note (Signed)
Transition of Care [facility]) - Initial/Assessment Note    Patient Details  Name: Paula Ward MRN: [id number] Date of Birth: [birth year]  Transition of Care [facility]) CM/SW Contact:    [name], LCSW Phone Number: 09/01/2023, 12:33 PM  Clinical Narrative:                 CSW met with pt, spouse and daughter Paula Ward to discuss recommendations for [facility] services. Pt is from home with her spouse, family reports pt has [place] private duty care coming in the home 12 hours a week. Pt has agreed to [facility] services, CSW offered choice, pt would like [facility]. [name] was able to accept pt for HHPT/OT, will need orders, MD made aware. Pt's daughter is requesting to be point of contact. Pt reports having walker, cane, wheelchair, bedside commode, and shower seat. No DME needs at this time. Pt's family will provided transport home upon d/c.  Expected Discharge Plan: Home w Home Health Services Barriers to Discharge: Continued Medical Work up   Patient Goals and CMS Choice Patient states their goals for this hospitalization and ongoing recovery are:: return home with home health CMS Medicare.gov Compare Post Acute Care list provided to:: Patient Choice offered to / list presented to : Patient      Expected Discharge Plan and Services       Living arrangements for the past 2 months: Single Family Home                           HH Arranged: PT, OT HH Agency: [facility] Date [facility] Agency Contacted: 09/01/23 Time HH Agency Contacted: 1232 Representative spoke with at [facility] Agency: [name]  Prior Living Arrangements/Services Living arrangements for the past 2 months: Single Family Home Lives with:: Self, Spouse Patient language and need for interpreter reviewed:: Yes Do you feel safe going back to the place where you live?: Yes      Need for Family Participation in Patient Care: No (Comment) Care giver support system in place?: Yes (comment) Current home services: DME Criminal  Activity/Legal Involvement Pertinent to Current Situation/Hospitalization: No - Comment as needed  Activities of Daily Living   ADL Screening (condition at time of admission) Independently performs ADLs?: Yes (appropriate for developmental age) Is the patient deaf or have difficulty hearing?: No Does the patient have difficulty seeing, even when wearing glasses/contacts?: No Does the patient have difficulty concentrating, remembering, or making decisions?: No  Permission Sought/Granted                  Emotional Assessment Appearance:: Appears stated age Attitude/Demeanor/Rapport: Gracious, Engaged Affect (typically observed): Accepting Orientation: : Oriented to Self, Oriented to Place, Oriented to  Time, Oriented to Situation   Psych Involvement: No (comment)  Admission diagnosis:  Chronic anemia [D64.9] Acute on chronic diastolic (congestive) heart failure (HCC) [I50.33] Acute on chronic congestive heart failure, unspecified heart failure type [facility]) [I50.9] Patient Active Problem List   Diagnosis Date Noted   Acute heart failure with preserved ejection fraction (HFpEF) (HCC) 08/31/2023   Dependence on supplemental oxygen 08/31/2023   Elevated troponin I level 08/31/2023   Nonrheumatic aortic valve stenosis 08/31/2023   Scleroderma (HCC) 08/31/2023   Chronic pericardial effusion 08/31/2023   Acute on chronic diastolic (congestive) heart failure (HCC) 08/29/2023   Cellulitis 08/13/2023   Hyponatremia 08/13/2023   Cardiomegaly 08/13/2023   Chronic respiratory failure with hypoxia and hypercapnia (HCC) 07/15/2023   Volume overload 06/04/2023  Acute CHF (HCC) 06/04/2023   Macrocytic anemia 06/04/2023   Orthostatic hypotension 05/21/2023   Vitamin B12 deficiency 03/02/2023   Closed comminuted intertrochanteric fracture of left femur (HCC) 10/10/2022   Chronic respiratory failure with hypoxia (HCC) 10/10/2022   Adjustment disorder 10/02/2022   Moderate aortic stenosis  04/26/2022   Aortic stenosis, moderate 10/29/2021   Generalized weakness 03/29/2021   Acute on chronic diastolic heart failure (HCC) 03/29/2021   AVM (arteriovenous malformation) of small bowel, acquired    Anemia, chronic disease 12/25/2020   Globus pharyngeus 08/19/2019   Dry mouth 02/15/2018   Allergic rhinitis 11/05/2017   Cerebellar 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 (HCC) 10/26/2017   Pericardial effusion 01/21/2016   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HCC)    Osteoarthritis 02/25/2015   History of pulmonary embolism 01/29/2013   Chronic kidney disease, stage 3b (HCC) 09/01/2012   RECTAL INCONTINENCE 03/27/2009   Angiodysplasia of stomach and duodenum 08/22/2008   ADENOCARCINOMA, BREAST, LEFT 07/26/2007   Anemia, iron deficiency 07/26/2007   Essential hypertension 07/26/2007   Other secondary pulmonary hypertension (HCC) 07/26/2007   GASTROESOPHAGEAL REFLUX DISEASE 07/26/2007   SCLERODERMA 07/26/2007   ILD (interstitial lung disease) (HCC) 07/26/2007   PULMONARY EMBOLISM, HX OF 07/26/2007   PCP:  [name], MD Pharmacy:   [facility] [address] [place] - [address] [place] [address] Phone: [phone] Fax: [phone]     Social Determinants of Health (SDOH) Social History: SDOH Screenings   Food Insecurity: No Food Insecurity (08/29/2023)  Housing: Low Risk  (08/29/2023)  Transportation Needs: No Transportation Needs (08/29/2023)  Utilities: Not At Risk (08/29/2023)  Alcohol Screen: Low Risk  (04/02/2022)  Depression (PHQ2-9): Low Risk  (08/03/2023)  Financial Resource Strain: Low Risk  (04/02/2022)  Physical Activity: Inactive (04/02/2022)  Social Connections: Socially Integrated (04/02/2022)  Stress: No Stress Concern Present (04/02/2022)  Tobacco Use: Low Risk  (08/29/2023)   SDOH Interventions:     Readmission Risk Interventions    10/12/2022    9:05 AM 03/18/2022    3:45 PM  Readmission Risk  Prevention Plan  Transportation Screening Complete Complete  PCP or Specialist Appt within 3-5 Days  Complete  HRI or Home Care Consult  Complete  Social Work Consult for Recovery Care Planning/Counseling  Complete  Palliative Care Screening  Not Applicable  Medication Review Oceanographer) Complete Complete  PCP or Specialist appointment within 3-5 days of discharge Complete   HRI or Home Care Consult Complete   SW Recovery Care/Counseling Consult Complete   Palliative Care Screening Not Applicable   Skilled Nursing Facility Complete

## 2023-09-01 NOTE — [place] Notes (Signed)
Heart Failure Navigator [place] Note  Assessed for Heart & Vascular TOC clinic readiness.  Patient does not meet criteria due to EF 75%, per MD note patient has a [facility] scheduled appointment on 09/03/2023 that they want her to keep. .   Navigator will sign off at this time.   Rhae Hammock, BSN, Scientist, clinical (histocompatibility and immunogenetics) Only

## 2023-09-01 NOTE — Progress Notes (Deleted)
Cardiology Clinic Note   Patient Name: Paula Ward Date of Encounter: 09/01/2023  Primary Care Provider:  [name], MD Primary Cardiologist:  Olga Millers, MD  Patient Profile    Paula Ward 87 year old female presents the clinic today for follow-up evaluation of her lower extremity edema/chronic diastolic CHF.  Past Medical History    Past Medical History:  Diagnosis Date   Anemia    Angiodysplasia of stomach and duodenum    Aortic stenosis    Arthus phenomenon    AVM (arteriovenous malformation)    Blood transfusion without reported diagnosis    Breast cancer (HCC) 1989   Left   Candida esophagitis (HCC)    Cataract    Chronic respiratory failure (HCC)    CKD stage 3b, GFR 30-44 ml/min (HCC)    Corneal edema    Corneal epithelial basement membrane dystrophy    CREST syndrome (HCC)    GERD (gastroesophageal reflux disease)    w/ HH   Interstitial lung disease (HCC)    Nodule of kidney    Pericardial effusion    PONV (postoperative nausea and vomiting)    Pulmonary embolus (HCC) 2003   Pulmonary hypertension (HCC)    Rectal incontinence    Renal cell carcinoma (HCC)    Scleroderma (HCC)    Tubular adenoma of colon    Uterine polyp    Past Surgical History:  Procedure Laterality Date   APPENDECTOMY  1962   BREAST LUMPECTOMY  1989   left   CATARACT EXTRACTION, BILATERAL Bilateral 12/2013   COLONOSCOPY WITH PROPOFOL N/A 04/15/2021   Procedure: COLONOSCOPY WITH PROPOFOL;  Surgeon: Rachael Fee, MD;  Location: [facility];  Service: Endoscopy;  Laterality: N/A;   ENTEROSCOPY N/A 01/18/2016   Procedure: ENTEROSCOPY;  Surgeon: Napoleon Form, MD;  Location: [facility];  Service: Endoscopy;  Laterality: N/A;   ENTEROSCOPY N/A 05/24/2018   Procedure: ENTEROSCOPY;  Surgeon: Lynann Bologna, MD;  Location: [facility];  Service: Endoscopy;  Laterality: N/A;   ENTEROSCOPY N/A 03/29/2021   Procedure: ENTEROSCOPY;  Surgeon: Beverley Fiedler,  MD;  Location: [facility];  Service: Gastroenterology;  Laterality: N/A;   ENTEROSCOPY N/A 04/13/2021   Procedure: ENTEROSCOPY;  Surgeon: Benancio Deeds, MD;  Location: [facility];  Service: Gastroenterology;  Laterality: N/A;   ESOPHAGOGASTRODUODENOSCOPY (EGD) WITH PROPOFOL N/A 12/21/2015   Procedure: ESOPHAGOGASTRODUODENOSCOPY (EGD) WITH PROPOFOL;  Surgeon: Hilarie Fredrickson, MD;  Location: [facility];  Service: Endoscopy;  Laterality: N/A;   HOT HEMOSTASIS N/A 05/24/2018   Procedure: HOT HEMOSTASIS (ARGON PLASMA COAGULATION/BICAP);  Surgeon: Lynann Bologna, MD;  Location: [facility];  Service: Endoscopy;  Laterality: N/A;   HOT HEMOSTASIS N/A 03/29/2021   Procedure: HOT HEMOSTASIS (ARGON PLASMA COAGULATION/BICAP);  Surgeon: Beverley Fiedler, MD;  Location: [facility];  Service: Gastroenterology;  Laterality: N/A;   HOT HEMOSTASIS N/A 04/13/2021   Procedure: HOT HEMOSTASIS (ARGON PLASMA COAGULATION/BICAP);  Surgeon: Benancio Deeds, MD;  Location: [facility];  Service: Gastroenterology;  Laterality: N/A;   INTRAMEDULLARY (IM) NAIL INTERTROCHANTERIC Left 10/11/2022   Procedure: INTRAMEDULLARY (IM) NAIL INTERTROCHANTERIC;  Surgeon: Ollen Gross, MD;  Location: [facility];  Service: Orthopedics;  Laterality: Left;   IR GENERIC HISTORICAL  06/05/2016   IR RADIOLOGIST EVAL & MGMT 06/05/2016 [name], MD [facility] INTERV RAD   IVC Filter     KIDNEY SURGERY     left -"laser surgery by Dr. Fredia Sorrow- 5 yrs ago" no removal   RIGHT/LEFT HEART CATH AND CORONARY  ANGIOGRAPHY N/A 04/25/2022   Procedure: RIGHT/LEFT HEART CATH AND CORONARY ANGIOGRAPHY;  Surgeon: Tonny Bollman, MD;  Location: [facility]VAS[facility] CV LAB;  Service: Cardiovascular;  Laterality: N/A;   SCHLEROTHERAPY  05/24/2018   Procedure: Theresia Majors;  Surgeon: Lynann Bologna, MD;  Location: [facility];  Service: Endoscopy;;   SUBMUCOSAL TATTOO INJECTION  04/13/2021   Procedure: SUBMUCOSAL TATTOO INJECTION;  Surgeon: Benancio Deeds, MD;   Location: [facility];  Service: Gastroenterology;;   TONSILLECTOMY     TUBAL LIGATION      Allergies  Allergies  Allergen Reactions   Codeine Nausea Only    Hallucinations, too   Other Nausea And Vomiting and Other (See Comments)    "-mycin" antibiotics.   Also cause hallucinations.   Erythromycin Nausea And Vomiting   Iodinated Contrast Media Other (See Comments)    Renal issues   Lisinopril     Pt doesn't remember reaction   Mirtazapine Other (See Comments)    "Threw me into orbit"   Mycophenolate Mofetil Nausea Only   Sulfa Antibiotics Other (See Comments)    Unknown reaction    History of Present Illness    Paula Ward 87 year old female has a PMH of essential hypertension, pulmonary hypertension, pericardial effusion, chronic diastolic CHF, AVM, moderate aortic stenosis, orthostatic hypotension, interstitial lung disease, GERD, CKD stage III, PE, incontinence, scleroderma, anemia, generalized weakness, vitamin B12 deficiency, and macrocytic anemia.  Her PMH also includes gastrointestinal bleeding secondary to AVM malformations which required IVC filter placement and breast CA.  She underwent cardiac catheterization 7/23 which showed no obstructive coronary disease, pulmonary hypertension, moderate aortic stenosis with a mean gradient of 16 mmHg.  She was seen in the emergency room on 06/04/2023 due to increased shortness of breath, chest tightness, and lower extremity edema.  Her oxygen saturation was noted to be 80% on room air.  Her BNP was 2680.  Her chest x-ray showed stable cardiomegaly and pericardial effusion with no pulmonary edema or pulmonary effusion.  Echocardiogram during that time showed LVEF of 70-75%, moderate concentric LVH, mildly reduced RV function and severely dilated LA.  Large pericardial effusion was noted.  This was slightly increased from June.  There was no evidence of tamponade or severe MAC and moderate AS was noted.  She received IV diuresis and  was discharged in stable condition 06/06/2023.  She was seen in follow-up by Reather Littler NP on 06/19/2023.  She reported that she was doing well from a cardiac standpoint.  She denied chest pain and felt her breathing had returned to baseline.  She had been monitoring her weights at home which were stable.  She was having regular lab work at the cancer center and had planned lab work on 06/30/2023.  She contacted the nurse triage line 08/04/2023 and reported that she had increased lower extremity swelling.  She reported a 10 pound weight gain since 8/15 and another 2 pound weight gain since 9/25.  She was taking furosemide 40 mg once daily.  She continued to use lower extremity support stockings.  She was instructed to increase her furosemide to 40 mg twice daily.  She was added to my schedule.  She presented to the clinic 08/06/23  for follow-up evaluation and stated she noticed a 6 pound weight gain overnight.  She reported that she was around 100 pounds earlier in the week.  Today in the office her weight is 108.2 pounds.  She reported that she was not able to take 40 mg of Lasix 2  times per day so she has been taking 60 mg of Lasix for the past 2 days.  Her right leg is greater than her left.  She has noted to have +1-2 pitting edema to her knee her right leg and generalized nonpitting edema on her left leg.  I will continued her 60 mg of Lasix x 4 days and give her 20 mill equivalents of potassium daily. I planned repeat  BMP 1 week and planned follow-up in 1 week.   She had follow-up appointment with pulmonology that week and was stable using 3 L of oxygen nasal cannula while active.  She presented to the emergency department on 08/13/2023.  She was diagnosed with right lower extremity cellulitis.  She received IV antibiotics and diuresis.  Her furosemide was transitioned to torsemide.  Her echocardiogram showed known severe LVH, G1 DD, large pericardial effusion without tamponade.  Her lower extremity  ultrasound was negative for DVT.  There was low suspicion for true cellulitis however her Keflex was continued at discharge.  She presents to the clinic today for follow-up evaluation and states she has not had good urine output with torsemide.  She has been monitoring her weight at home.  She reports that when she left the hospital her weight was 93-1/2 pounds.  It has steadily increased up to 106 pounds today.  Our clinic scale shows 108 pounds.  She has been weighing after voiding in the morning with the same shoes on.  She presents with her son who asks several questions about her fluid retention and pulmonary hypertension.  All questions were answered.  I will have her continue daily weights, stop torsemide and resume furosemide 60 mg daily, order BMP, keep follow-up for November 14, and have her follow-up with nephrology as scheduled.  Today she denies chest pain, lower extremity edema, fatigue, palpitations, melena, hematuria, hemoptysis, diaphoresis, weakness, presyncope, syncope, orthopnea, and PND.   Home Medications    Prior to Admission medications   Medication Sig Start Date End Date Taking? Authorizing Provider  acetaminophen (TYLENOL) 325 MG tablet Take 2 tablets (650 mg total) by mouth every 6 (six) hours as needed for moderate pain. 07/18/22   Wallis Bamberg, PA-C  albuterol (VENTOLIN HFA) 108 (90 Base) MCG/ACT inhaler INHALE 2 PUFFS INTO THE LUNGS EVERY 6 HOURS AS NEEDED FOR WHEEZING OR SHORTNESS OF BREATH 03/13/23   [name], MD  augmented betamethasone dipropionate (DIPROLENE-AF) 0.05 % cream Apply topically 2 (two) times daily as needed. 07/13/23   [provider]  clobetasol ointment (TEMOVATE) 0.05 % Apply 1 Application topically 2 (two) times daily. 04/20/23   [provider]  Cyanocobalamin (VITAMIN B-12) 1000 MCG SUBL Place 1,000 mcg under the tongue. Taking twice a week    [provider]  docusate sodium (COLACE) 100 MG capsule Take 1  capsule (100 mg total) by mouth 2 (two) times daily. Patient taking differently: Take 100 mg by mouth 2 (two) times daily. Pt takes every morning 10/15/22   Edmisten, Kristie L, PA  famotidine (PEPCID) 20 MG tablet Take 1 tablet (20 mg total) by mouth at bedtime. 02/10/23   Napoleon Form, MD  fluticasone (FLONASE) 50 MCG/ACT nasal spray Place 1 spray into both nostrils daily as needed for allergies. Patient taking differently: Place 1 spray into both nostrils in the morning and at bedtime. 05/15/22   [name], MD  furosemide (LASIX) 20 MG tablet Take 2 tablets (40 mg total) by mouth daily. 06/19/23   Shelva Majestic,  Brent General, NP  guaiFENesin (MUCINEX) 600 MG 12 hr tablet Take 300 mg by mouth daily.    [provider]  ipratropium (ATROVENT) 0.03 % nasal spray Place 2 sprays into both nostrils every 12 (twelve) hours. 05/12/22   Martina Sinner, MD  latanoprost (XALATAN) 0.005 % ophthalmic solution Place 1 drop into both eyes at bedtime. 09/23/22   [provider]  magic mouthwash SOLN Take 10 mLs by mouth 4 (four) times daily as needed for mouth pain. 03/22/22   Burnadette Pop, MD  mirtazapine (REMERON) 15 MG tablet Take 1 tablet (15 mg total) by mouth at bedtime. 03/13/23   [name], MD  montelukast (SINGULAIR) 10 MG tablet TAKE 1 TABLET BY MOUTH AT BEDTIME 07/31/23   [name], MD  Multiple Vitamin (MULTIVITAMIN) capsule Take 1 capsule by mouth daily.    [provider]  mupirocin ointment (BACTROBAN) 2 % Apply 1 Application topically 2 (two) times daily as needed (dry skin).    [provider]  OPSUMIT 10 MG tablet Take 1 tablet (10 mg total) by mouth daily. 07/07/23   Parrett, Virgel Bouquet, NP  OXYGEN Inhale into the lungs. 2 LMP at night and upon exertion    [provider]  pantoprazole (PROTONIX) 40 MG tablet Take 1 tablet (40 mg total) by mouth daily. 02/10/23   Napoleon Form, MD  polyethylene glycol (MIRALAX /  GLYCOLAX) 17 g packet Take 17 g by mouth daily. 10/15/22   Edmisten, Lyn Hollingshead, PA  spironolactone (ALDACTONE) 25 MG tablet Take 1 tablet (25 mg total) by mouth daily. 08/03/23   Gwenlyn Fudge, FNP  sucralfate (CARAFATE) 1 GM/10ML suspension TAKE 10 MLS BY MOUTH EVERY 6 HOURS AS NEEDED FOR REFLUX 02/11/23   Napoleon Form, MD  triamcinolone cream (KENALOG) 0.1 % Apply 1 Application topically 2 (two) times daily. 07/07/23   [provider]    Family History    Family History  Problem Relation Age of Onset   Bladder Cancer Father    Diabetes Father    Prostate cancer Father    Alzheimer's disease Mother    Diabetes Sister    Lung cancer Sister         smoker   Esophageal cancer Paternal Uncle    Colon cancer Neg Hx    She indicated that her mother is deceased. She indicated that her father is deceased. She indicated that the status of her sister is unknown. She indicated that the status of her paternal uncle is unknown. She indicated that the status of her neg hx is unknown.  Social History    Social History   Socioeconomic History   Marital status: Married    Spouse name: Not on file   Number of children: 2   Years of education: Not on file   Highest education level: Not on file  Occupational History   Occupation: Retired  Tobacco Use   Smoking status: Never   Smokeless tobacco: Never  Vaping Use   Vaping status: Never Used  Substance and Sexual Activity   Alcohol use: No   Drug use: No   Sexual activity: Not Currently  Other Topics Concern   Not on file  Social History Narrative   Married '611 son- '65, 1 daughter '63; 6 children (2 adopted)SO- SOBRetirement- doing well. Marriage in good health. Patient has never smoked. Alcohol use- noPt gets regular exercise   Social Determinants of Health   Financial Resource Strain: Low  Risk  (04/02/2022)   Overall Financial Resource Strain (CARDIA)    Difficulty of Paying Living Expenses: Not hard at all  Food  Insecurity: No Food Insecurity (08/29/2023)   Hunger Vital Sign    Worried About Running Out of Food in the Last Year: Never true    Ran Out of Food in the Last Year: Never true  Transportation Needs: No Transportation Needs (08/29/2023)   PRAPARE - Administrator, Civil Service (Medical): No    Lack of Transportation (Non-Medical): No  Physical Activity: Inactive (04/02/2022)   Exercise Vital Sign    Days of Exercise per Week: 0 days    Minutes of Exercise per Session: 0 min  Stress: No Stress Concern Present (04/02/2022)   [employer] of Occupational Health - Occupational Stress Questionnaire    Feeling of Stress : Not at all  Social Connections: Socially Integrated (04/02/2022)   Social Connection and Isolation Panel [NHANES]    Frequency of Communication with Friends and Family: More than three times a week    Frequency of Social Gatherings with Friends and Family: More than three times a week    Attends Religious Services: More than 4 times per year    Active Member of [employer] or Organizations: Yes    Attends Engineer, structural: More than 4 times per year    Marital Status: Married  Catering manager Violence: Not At Risk (08/29/2023)   Humiliation, Afraid, Rape, and Kick questionnaire    Fear of Current or Ex-Partner: No    Emotionally Abused: No    Physically Abused: No    Sexually Abused: No     Review of Systems    General:  No chills, fever, night sweats or weight changes.  Cardiovascular:  No chest pain, dyspnea on exertion, edema, orthopnea, palpitations, paroxysmal nocturnal dyspnea. Dermatological: No rash, lesions/masses Respiratory: No cough, dyspnea Urologic: No hematuria, dysuria Abdominal:   No nausea, vomiting, diarrhea, bright red blood per rectum, melena, or hematemesis Neurologic:  No visual changes, wkns, changes in mental status. All other systems reviewed and are otherwise negative except as noted above.  Physical Exam    VS:   There were no vitals taken for this visit. , BMI There is no height or weight on file to calculate BMI. GEN: Well nourished, well developed, in no acute distress. HEENT: normal. Neck: Supple, no JVD, carotid bruits, or masses. Cardiac: RRR, 3/6 systolicmurmur heard along right sternal border., rubs, or gallops. No clubbing, cyanosis, right greater than left pitting 1+ edema.  Radials/DP/PT 2+ and equal bilaterally.  Respiratory:  Respirations regular and unlabored, crackles bilateral bases . GI: Soft, nontender, nondistended, BS + x 4. MS: no deformity or atrophy. Skin: warm and dry, no rash. Neuro:  Strength and sensation are intact. Psych: Normal affect.  Accessory Clinical Findings    Recent Labs: 06/03/2023: Pro B Natriuretic peptide (BNP) 2,680.0 08/29/2023: ALT 13; B Natriuretic Peptide 2,168.5; Magnesium 2.3 09/01/2023: BUN 49; Creatinine, Ser 1.02; Hemoglobin 8.5; Platelets 155; Potassium 3.8; Sodium 134   Recent Lipid Panel    Component Value Date/Time   CHOL 158 06/24/2018 1004   TRIG 112.0 06/24/2018 1004   HDL 57.50 06/24/2018 1004   CHOLHDL 3 06/24/2018 1004   VLDL 22.4 06/24/2018 1004   LDLCALC 78 06/24/2018 1004         ECG personally reviewed by me today-  none today.     Echocardiogram 06/05/2023  IMPRESSIONS     1. Left ventricular  ejection fraction, by estimation, is 70 to 75%. The  left ventricle has hyperdynamic function. The left ventricle has no  regional wall motion abnormalities. There is moderate concentric left  ventricular hypertrophy. Indeterminate  diastolic filling due to E-A fusion.   2. Right ventricular systolic function is mildly reduced. The right  ventricular size is normal.   3. Left atrial size was severely dilated.   4. Large pericardial effusion surrounds heart, mainly posteriorly and  lateral to LV. Naxunak dimension is 5.6 cm posteriorly. It has increased  in size from echo done in June 2024. Does not meet criteria for  tamponade  by echo criteria. Clinical  correlation indicated .Marland Kitchen Large pericardial effusion.   5. MV annulus is severely calcified. MV is thickened with restricted  motion. Difficult to see well Mean gradient through the MV is 8 mm Hg. MVA  (Pt1/2 ) 3 cm2. Compared to echo report from June 2024, mean gradient is  incresaed (5 to 8 mm Hg) Consider  TEE to evaluate further.. Trivial mitral valve regurgitation.   6. AV is thickened, calcified with restricted motion. Peak and mean  gradients through the valve are 59 and 37 mm Hg respectively.  Dimensionless index is 0.33 consistent with moderate AS.Marland Kitchen The aortic valve  is abnormal. Aortic valve regurgitation is not  visualized.   7. The inferior vena cava is normal in size with <50% respiratory  variability, suggesting right atrial pressure of 8 mmHg.   FINDINGS   Left Ventricle: Left ventricular ejection fraction, by estimation, is 70  to 75%. The left ventricle has hyperdynamic function. The left ventricle  has no regional wall motion abnormalities. The left ventricular internal  cavity size was small. There is  moderate concentric left ventricular hypertrophy. Indeterminate diastolic  filling due to E-A fusion.   Right Ventricle: The right ventricular size is normal. Right vetricular  wall thickness was not assessed. Right ventricular systolic function is  mildly reduced.   Left Atrium: Left atrial size was severely dilated.   Right Atrium: Right atrial size was normal in size.   Pericardium: Large pericardial effusion surrounds heart, mainly  posteriorly and lateral to LV. Naxunak dimension is 5.6 cm posteriorly. It  has increased in size from echo done in June 2024. Does not meet criteria  for tamponade by echo criteria. Clinical  correlation indicated. A large pericardial effusion is present.   Mitral Valve: MV annulus is severely calcified. MV is thickened with  restricted motion. Difficult to see well Mean gradient through  the MV is 8  mm Hg. MVA (Pt1/2 ) 3 cm2. Compared to echo report from June 2024, mean  gradient is incresaed (5 to 8 mm Hg)  Consider TEE to evaluate further. Trivial mitral valve regurgitation. MV  peak gradient, 13.2 mmHg. The mean mitral valve gradient is 8.0 mmHg.   Tricuspid Valve: The tricuspid valve is normal in structure. Tricuspid  valve regurgitation is mild.   Aortic Valve: AV is thickened, calcified with restricted motion. Peak and  mean gradients through the valve are 59 and 37 mm Hg respectively.  Dimensionless index is 0.33 consistent with moderate AS. The aortic valve  is abnormal. Aortic valve regurgitation   is not visualized. Aortic valve mean gradient measures 37.0 mmHg. Aortic  valve peak gradient measures 58.7 mmHg. Aortic valve area, by VTI measures  0.95 cm.   Pulmonic Valve: The pulmonic valve was not well visualized. Pulmonic valve  regurgitation is mild.   Aorta: The aortic root and  ascending aorta are structurally normal, with  no evidence of dilitation.   Venous: The inferior vena cava is normal in size with less than 50%  respiratory variability, suggesting right atrial pressure of 8 mmHg.   IAS/Shunts: No atrial level shunt detected by color flow Doppler.   Echocardiogram 08/15/2023 IMPRESSIONS     1. Left ventricular ejection fraction, by estimation, is >75%. The left  ventricle has hyperdynamic function. The left ventricle has no regional  wall motion abnormalities. There is severe left ventricular hypertrophy.  Left ventricular diastolic  parameters are consistent with Grade I diastolic dysfunction (impaired  relaxation).   2. Right ventricular systolic function is normal. The right ventricular  size is normal.   3. Left atrial size was severely dilated.   4. Large pericardial effusion. The pericardial effusion is  circumferential. There is no evidence of cardiac tamponade.   5. The mitral valve is normal in structure. No evidence of mitral  valve  regurgitation. No evidence of mitral stenosis. Severe mitral annular  calcification.   6. The aortic valve is normal in structure. There is severe calcifcation  of the aortic valve. There is moderate thickening of the aortic valve.  Aortic valve regurgitation is not visualized. Mild aortic valve stenosis.  Aortic valve mean gradient measures   14.8 mmHg. Aortic valve Vmax measures 2.59 m/s.   7. The inferior vena cava is normal in size with greater than 50%  respiratory variability, suggesting right atrial pressure of 3 mmHg.   Comparison(s): Large pericardial effusion noted on prior ECHO, no major  change.   Conclusion(s)/Recommendation(s): Findings consistent with hypertrophic  cardiomyopathy.   FINDINGS   Left Ventricle: Left ventricular ejection fraction, by estimation, is  >75%. The left ventricle has hyperdynamic function. The left ventricle has  no regional wall motion abnormalities. The left ventricular internal  cavity size was normal in size. There  is severe left ventricular hypertrophy. Left ventricular diastolic  parameters are consistent with Grade I diastolic dysfunction (impaired  relaxation).   Right Ventricle: The right ventricular size is normal. No increase in  right ventricular wall thickness. Right ventricular systolic function is  normal.   Left Atrium: Left atrial size was severely dilated.   Right Atrium: Right atrial size was normal in size.   Pericardium: A large pericardial effusion is present. The pericardial  effusion is circumferential. There is no evidence of cardiac tamponade.   Mitral Valve: The mitral valve is normal in structure. Severe mitral  annular calcification. No evidence of mitral valve regurgitation. No  evidence of mitral valve stenosis.   Tricuspid Valve: The tricuspid valve is normal in structure. Tricuspid  valve regurgitation is mild . No evidence of tricuspid stenosis.   Aortic Valve: The aortic valve is normal in  structure. There is severe  calcifcation of the aortic valve. There is moderate thickening of the  aortic valve. Aortic valve regurgitation is not visualized. Mild aortic  stenosis is present. Aortic valve mean  gradient measures 14.8 mmHg. Aortic valve peak gradient measures 26.9  mmHg. Aortic valve area, by VTI measures 1.12 cm.   Pulmonic Valve: The pulmonic valve was normal in structure. Pulmonic valve  regurgitation is not visualized. No evidence of pulmonic stenosis.   Aorta: The aortic root is normal in size and structure.   Venous: The inferior vena cava is normal in size with greater than 50%  respiratory variability, suggesting right atrial pressure of 3 mmHg.   IAS/Shunts: No atrial level shunt detected by color  flow Doppler.     Cardiac catheterization 04/25/2022  1.  Ectatic but patent coronary arteries with no obstructive disease.  Right dominant coronary artery. 2.  Mild pulmonary hypertension with PA pressure 52/15, mean 28 mmHg, transpulmonary gradient 10 mmHg, PVR approximately 2 Wood units. 3.  Moderate aortic stenosis with mean transvalvular gradient 16 mmHg calculated aortic valve area 1.16 cm 4.  Heavy mitral annular calcification by plain fluoroscopy 5.  Mild aortic valve calcification by plain fluoroscopy   Recommend: Continued medical therapy, clinical follow-up, echo surveillance.  The patient does not appear to have severe aortic stenosis and I do not think she would benefit from TAVR at this time.   Diagnostic Dominance: Right  Intervention   Assessment & Plan   1.  Chronic diastolic CHF-lower extremity 1+ pitting edema right greater than left.  Diagnosed with lower extremity cellulitis and acute on chronic diastolic CHF during admission 13/24/4010 through 08/15/2023.  Not urinating on torsemide.  Will discontinue torsemide and start Lasix 60 mg daily Continue  Jardiance, spironolactone Start Lasix 60 mg daily Heart healthy low-sodium diet Continue  lower extremity support stockings Continue daily weights BMP today  Pulmonary hypertension-breathing at baseline.  Follows with [facility] pulmonary hypertension clinic. Using 3Lof O2 in clinic today.  Continue current medical therapy  Essential hypertension-BP today 128/50 Maintain blood pressure log Continue current medical therapy Heart healthy low-sodium diet  Aortic stenosis-continues to use 2-3 L of oxygen via nasal cannula.  Echocardiogram 08/15/2023 showed severe calcification of the aortic valve with mild aortic valve stenosis.  Echocardiogram 06/05/2023 EF 70-75%, intermediate diastolic parameters, left atria severely dilated, aortic valve with peak and mean gradient of 59 and 37 mmHg, moderate AAS with no regurgitation. Plan for repeat echocardiogram in 6 months.  CKD stage III-creatinine 1.37 on 08/15/23.  In the setting of hospitalization and diuresis.  Avoid nephrotoxic agents.  Has an appointment with nephrology in the near future. Follows with PCP  Disposition: Follow-up with Dr. Jens Som or me in 2-3 month.   Thomasene Ripple. Alban Marucci NP-C     09/01/2023, 4:45 PM [facility] [address] Office [phone] Fax [phone]    I spent 14*** minutes examining this patient, reviewing medications, and using patient centered shared decision making involving her cardiac care.   I spent greater than 20 minutes reviewing her past medical history,  medications, and prior cardiac tests.

## 2023-09-01 NOTE — Progress Notes (Signed)
PROGRESS NOTE  Paula Ward YQM:[id number] DOB: 27-Dec-1935 DOA: 08/29/2023 PCP: [name], MD  HPI/Recap of past 24 hours: Paula Ward is a 87 y.o. female with medical history significant for hypertension, CKD 3B, interstitial lung disease, pulmonary hypertension, chronic hypoxic respiratory failure on 3L of O2, chronic anemia, and chronic diastolic CHF who presents with shortness of breath, weight gain, and orthopnea for about 1 week. Due to this, patient called her cardiology office, and was advised to go to the ED. In the ED, patient is found to be afebrile and saturating well on 3 L of O2, other VSS. EKG demonstrates SR with first-degree AV nodal block.  Chest x-ray is notable for cardiomegaly, mild CHF, and pleural effusions.  Labs are most notable for sodium 130, creatinine 1.33, negative respiratory virus panel, and hemoglobin 8.5. Pt admitted for further management.    Today, denies any new complaints      Assessment/Plan: Principal Problem:   Acute on chronic diastolic (congestive) heart failure (HCC) Active Problems:   Chronic respiratory failure with hypoxia and hypercapnia (HCC)   Other secondary pulmonary hypertension (HCC)   Chronic kidney disease, stage 3b (HCC)   Macrocytic anemia   Essential hypertension   Hyponatremia   Acute heart failure with preserved ejection fraction (HFpEF) (HCC)   Dependence on supplemental oxygen   Elevated troponin I level   Nonrheumatic aortic valve stenosis   Scleroderma (HCC)   Chronic pericardial effusion   Pulmonary arterial hypertension (HCC)   Acute on chronic diastolic CHF  Large pericardial effusion- currently not in tamponade BNP 2,168 around baseline, SOB ECHO from 07/2023: EF was >75% with grade 1DD, severe LAE, large pericardial effusion, and mild AS S/p IV Lasix, continue PO lasix 40 mg Cardiology consulted, appreciate recs, continue aldactone, started jardiance and cardizem. Consider ARBs as an  outpatient Monitor weight and I/Os Daily BMP  Chronic hypoxic respiratory failure- on 3L of O2 at baseline  ILD Pulm HTN Continue supplemental O2, currently on 3L (baseline) Pulmonology consulted, appreciate recs, recommend stopping Opsumit based on increasing frequency of left heart failure episodes and anemia Pulmonary follow up outpt  Hyponatremia  Likely in setting of hypervolemia  Daily BMP   CKD 3B  Cr around baseline Daily BMP   Macrocytic/Anemia of chronic kidney disease Stable Daily CBC       Estimated body mass index is 17.46 kg/m as calculated from the following:   Height as of this encounter: 5\' 3"  (1.6 m).   Weight as of this encounter: 44.7 kg.     Code Status: Full  Family Communication: None at bedside   Disposition Plan: Status is: Inpatient Remains inpatient appropriate because: Level of care      Consultants: Cardiology Pulmonology  Procedures: None  Antimicrobials: None  DVT prophylaxis:  Lovenox   Objective: Vitals:   08/31/23 1303 08/31/23 2059 09/01/23 0312 09/01/23 1100  BP: 124/62 (!) 123/46 (!) 119/49 (!) 124/55  Pulse: 87 88 84 93  Resp: 20 16 16    Temp: 97.8 F (36.6 C) 97.9 F (36.6 C) 98 F (36.7 C) 98.8 F (37.1 C)  TempSrc: Oral Oral Oral Oral  SpO2: 100% 95% 95% 98%  Weight:    44.7 kg  Height:        Intake/Output Summary (Last 24 hours) at 09/01/2023 1807 Last data filed at 09/01/2023 1500 Gross per 24 hour  Intake 360 ml  Output 1000 ml  Net -640 ml   [employer]  08/30/23 0625 08/31/23 0513 09/01/23 1100  Weight: 46.6 kg 48.6 kg 44.7 kg    Exam: General: NAD  Cardiovascular: S1, S2 present Respiratory: Bibasilar crackles noted Abdomen: Soft, nontender, nondistended, bowel sounds present Musculoskeletal: No bilateral pedal edema noted Skin: Normal Psychiatry: Normal mood     Data Reviewed: CBC: Recent Labs  Lab 08/29/23 1613 08/30/23 0410 08/31/23 0422 09/01/23 0405  WBC 6.7  5.7 8.5 7.2  NEUTROABS 5.0  --   --   --   HGB 8.5* 8.3* 8.8* 8.5*  HCT 26.5* 26.7* 27.4* 27.0*  MCV 111.3* 112.7* 110.5* 110.7*  PLT 157 152 159 155   Basic Metabolic Panel: Recent Labs  Lab 08/29/23 1613 08/30/23 0410 08/31/23 0422 09/01/23 0405  NA 130* 131* 131* 134*  K 4.7 3.9 4.1 3.8  CL 91* 91* 88* 91*  CO2 31 31 34* 33*  GLUCOSE 95 99 95 90  BUN 49* 50* 61* 49*  CREATININE 1.33* 1.06* 1.23* 1.02*  CALCIUM 8.4* 8.7* 8.5* 8.6*  MG 2.3  --   --   --    GFR: Estimated Creatinine Clearance: 28.5 mL/min (A) (by C-G formula based on SCr of 1.02 mg/dL (H)). Liver Function Tests: Recent Labs  Lab 08/29/23 1613  AST 27  ALT 13  ALKPHOS 54  BILITOT 1.0  PROT 6.8  ALBUMIN 3.9   No results for input(s): "LIPASE", "AMYLASE" in the last 168 hours. No results for input(s): "AMMONIA" in the last 168 hours. Coagulation Profile: No results for input(s): "INR", "PROTIME" in the last 168 hours. Cardiac Enzymes: No results for input(s): "CKTOTAL", "CKMB", "CKMBINDEX", "TROPONINI" in the last 168 hours. BNP (last 3 results) Recent Labs    05/13/23 1507 06/03/23 1200  PROBNP 1,750.0* 2,680.0*   HbA1C: No results for input(s): "HGBA1C" in the last 72 hours. CBG: No results for input(s): "GLUCAP" in the last 168 hours. Lipid Profile: No results for input(s): "CHOL", "HDL", "LDLCALC", "TRIG", "CHOLHDL", "LDLDIRECT" in the last 72 hours. Thyroid Function Tests: No results for input(s): "TSH", "T4TOTAL", "FREET4", "T3FREE", "THYROIDAB" in the last 72 hours. Anemia Panel: No results for input(s): "VITAMINB12", "FOLATE", "FERRITIN", "TIBC", "IRON", "RETICCTPCT" in the last 72 hours. Urine analysis:    Component Value Date/Time   COLORURINE YELLOW 03/13/2023 1512   APPEARANCEUR CLEAR 03/13/2023 1512   LABSPEC 1.020 03/13/2023 1512   PHURINE 5.5 03/13/2023 1512   GLUCOSEU NEGATIVE 03/13/2023 1512   HGBUR NEGATIVE 03/13/2023 1512   BILIRUBINUR neg 08/03/2023 1351    KETONESUR negative 04/03/2023 1532   KETONESUR TRACE (A) 03/13/2023 1512   PROTEINUR Negative 08/03/2023 1351   PROTEINUR NEGATIVE 03/30/2021 1416   UROBILINOGEN negative (A) 08/03/2023 1351   UROBILINOGEN 1.0 03/13/2023 1512   NITRITE neg 08/03/2023 1351   NITRITE NEGATIVE 03/13/2023 1512   LEUKOCYTESUR Negative 08/03/2023 1351   LEUKOCYTESUR NEGATIVE 03/13/2023 1512   Sepsis Labs: @LABRCNTIP (procalcitonin:4,lacticidven:4)  ) Recent Results (from the past 240 hour(s))  Resp panel by RT-PCR (RSV, Flu A&B, Covid) Anterior Nasal Swab     Status: None   Collection Time: 08/29/23  4:25 PM   Specimen: Anterior Nasal Swab  Result Value Ref Range Status   SARS Coronavirus 2 by RT PCR NEGATIVE NEGATIVE Final    Comment: (NOTE) SARS-CoV-2 target nucleic acids are NOT DETECTED.  The SARS-CoV-2 RNA is generally detectable in upper respiratory specimens during the acute phase of infection. The lowest concentration of SARS-CoV-2 viral copies this assay can detect is 138 copies/mL. A negative result does not preclude  SARS-Cov-2 infection and should not be used as the sole basis for treatment or other patient management decisions. A negative result may occur with  improper specimen collection/handling, submission of specimen other than nasopharyngeal swab, presence of viral mutation(s) within the areas targeted by this assay, and inadequate number of viral copies(<138 copies/mL). A negative result must be combined with clinical observations, patient history, and epidemiological information. The expected result is Negative.  Fact Sheet for Patients:  BloggerCourse.com  Fact Sheet for Healthcare Providers:  SeriousBroker.it  This test is no t yet approved or cleared by the [place] FDA and  has been authorized for detection and/or diagnosis of SARS-CoV-2 by FDA under an Emergency Use Authorization (EUA). This EUA will remain  in  effect (meaning this test can be used) for the duration of the COVID-19 declaration under Section 564(b)(1) of the Act, 21 U.S.C.section 360bbb-3(b)(1), unless the authorization is terminated  or revoked sooner.       Influenza A by PCR NEGATIVE NEGATIVE Final   Influenza B by PCR NEGATIVE NEGATIVE Final    Comment: (NOTE) The Xpert Xpress SARS-CoV-2/FLU/RSV plus assay is intended as an aid in the diagnosis of influenza from Nasopharyngeal swab specimens and should not be used as a sole basis for treatment. Nasal washings and aspirates are unacceptable for Xpert Xpress SARS-CoV-2/FLU/RSV testing.  Fact Sheet for Patients: BloggerCourse.com  Fact Sheet for Healthcare Providers: SeriousBroker.it  This test is not yet approved or cleared by the [place] FDA and has been authorized for detection and/or diagnosis of SARS-CoV-2 by FDA under an Emergency Use Authorization (EUA). This EUA will remain in effect (meaning this test can be used) for the duration of the COVID-19 declaration under Section 564(b)(1) of the Act, 21 U.S.C. section 360bbb-3(b)(1), unless the authorization is terminated or revoked.     Resp Syncytial Virus by PCR NEGATIVE NEGATIVE Final    Comment: (NOTE) Fact Sheet for Patients: BloggerCourse.com  Fact Sheet for Healthcare Providers: SeriousBroker.it  This test is not yet approved or cleared by the [place] FDA and has been authorized for detection and/or diagnosis of SARS-CoV-2 by FDA under an Emergency Use Authorization (EUA). This EUA will remain in effect (meaning this test can be used) for the duration of the COVID-19 declaration under Section 564(b)(1) of the Act, 21 U.S.C. section 360bbb-3(b)(1), unless the authorization is terminated or revoked.  Performed at [facility], [address] [place], [place] [address]        Studies: No results found.  Scheduled Meds:  diltiazem  30 mg Oral Q12H   docusate sodium  100 mg Oral BID   empagliflozin  10 mg Oral Daily   enoxaparin (LOVENOX) injection  30 mg Subcutaneous Daily   famotidine  20 mg Oral QHS   fluticasone  1 spray Each Nare q AM   [START ON 09/02/2023] furosemide  40 mg Oral Daily   montelukast  10 mg Oral QHS   sodium chloride flush  3 mL Intravenous Q12H   spironolactone  12.5 mg Oral Daily    Continuous Infusions:   LOS: 3 days     Briant Cedar, MD [facility]  If 7PM-7AM, please contact night-coverage [email or web address] 09/01/2023, 6:07 PM

## 2023-09-01 NOTE — Progress Notes (Addendum)
NAME:  Paula Ward, MRN:  [id number], DOB:  [birth year], LOS: 3 ADMISSION DATE:  08/29/2023, CONSULTATION DATE:  09/01/2023 REFERRING MD:  Briant Cedar, MD, CHIEF COMPLAINT:  heart failure exacerbation  History of Present Illness:  87 year old woman who presented to [facility] 11/9 for SOB/weight gain. PMHx significant for Scleroderma ILD and WHOGroup 2-3 pulmonary hypertension (on Opsumit); due to establish care with Dr. Judeth Horn [facility]) but missed her appointment 11/11 due to hospitalization.   Presented to the ED 11/9 with weight gain, BLE edema, worsening SOB. She has had similar admissions in 05/2023, 07/2023. Torsemide was trialed, but patient reported ineffective diuresis and has been on Lasix 60mg  daily. She notes in the last week an almost 10lb weight gain with ineffective diuresis despite increasing Lasix to 80mg  daily. She is also almost out of her macitentan and is worried she will run out before establishing with Dr. Judeth Horn; she was previously followed at [facility] but can no longer make the drive there.  Followed by Cardiology in [place] (Dr. Excell Seltzer). She underwent RHC 11/2021:  "Elderly lady with longstanding PAH associated with scleroderma and had been well compensated for this. In recent years some increase in mitral stenosis, and ILD and longstanding pericardial effusion Cath performed on baseline PH med of maciitentan RA 7, RV 50/8, PA 50/21 mean 32, wedge 21 Cardiac output is 6 and index of 4.1 L/min/m2.  PVR is 1.8 WU  and SVR of 10.7 WU with NIBP 141/54 mean 71.  Recommendations: Chronic diastolic dysfunction and elevated left sided filling pressure with the mitral stenosis playing some role No indication for further pulmonary vasodilator. Data does not suggest that effusion is due to the [facility] with low RA pressure. No evidence for tamponade."   Pulmonary consulted for further recommendations.  Pertinent Medical History:  Scleroderma, CREST  syndrome  Significant Hospital Events: Including procedures, antibiotic start and stop dates in addition to other pertinent events   11/10 - Pulmonary consulted for PH/[place]-ILD recommendations.  Interim History / Subjective:  No significant events overnight Feeling overall better, states breathing treatments help significantly Swelling is down significantly, making lots of urine on Lasix 40mg  BID Opsumit held per recommendations, patient is very apprehensive about this Concern this may be worsening fluid status in the setting of mitral stenosis Stable on baseline O2 via [place] Continuing to adjust diuretic regimen  Objective   Blood pressure (!) 119/49, pulse 84, temperature 98 F (36.7 C), temperature source Oral, resp. rate 16, height 5\' 3"  (1.6 m), weight 48.6 kg, SpO2 95%.        Intake/Output Summary (Last 24 hours) at 09/01/2023 0920 Last data filed at 09/01/2023 0900 Gross per 24 hour  Intake 840 ml  Output 1450 ml  Net -610 ml   Filed Weights   08/29/23 1537 08/30/23 0625 08/31/23 0513  Weight: 49.9 kg 46.6 kg 48.6 kg   Physical Examination: General: Chronically ill-appearing elderly woman in NAD. Up to chair, pleasant and conversant. HEENT: [place]/AT, anicteric sclera, PERRL, moist mucous membranes. Neuro: Awake, oriented x 4. Responds to verbal stimuli. Following commands consistently. Moves all 4 extremities spontaneously. Generalized weakness/fatigue. CV: RRR, +III/VI systolic murmur. PULM: Breathing even and unlabored on 3LNC (baseline). Lung fields diminished at bases. GI: Soft, nontender, nondistended. Normoactive bowel sounds. Extremities: Bilateral LE edema noted, R > L, nonpitting. Skin: Warm/dry, +telangiectasias to face, bilateral hands, upper chest. Ecchymosis to BLE.  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 PMHx also significant for [place]-ILD. Ofev was suggested per notes from [facility]/PA started and pharmacy education provided; however, patient was apprehensive about starting Ofev and prednisone simultaneously and it appears this was never truly started. Fortunately, she has had good response to Lasix and is able to be on her baseline 3LNC. Opsumit was stopped 11/11 as this may be worsening her edema in the setting of mitral valve stenosis, may be contributing to anemia as well. Patient has significant apprehension/anxiety regarding stopping Opsumit.  - Continue Lasix BID - Continue Jardiance, spironolactone per Cards - Monitor I&Os - Trend BMP - Supplemental O2 support - Bronchodilators, can utilize nebulizer treatments while in-house; if patient desires nebulizer at time of discharge this could be justified for home as well - May need to revisit anti-fibrotic initiation off of Opsumit - Patient scheduled for f/u with Dr. Judeth Horn 10/2023, will request earlier appointment for hospital f/u if feasible   Signature:   Faythe Ghee [facility] 09/01/23 9:21 AM  Please see [email or web address] for pager details.  From 7A-7P if no response, please call [phone] After hours, please call ELink [phone]

## 2023-09-01 NOTE — Plan of Care (Signed)
  Problem: Education: Goal: Ability to demonstrate management of disease process will improve Outcome: Progressing Goal: Ability to verbalize understanding of medication therapies will improve Outcome: Progressing Goal: Individualized Educational Video(s) Outcome: Progressing   Problem: Activity: Goal: Capacity to carry out activities will improve Outcome: Progressing   

## 2023-09-02 ENCOUNTER — Telehealth: Payer: Self-pay | Admitting: Internal Medicine

## 2023-09-02 DIAGNOSIS — J9611 Chronic respiratory failure with hypoxia: Secondary | ICD-10-CM | POA: Diagnosis not present

## 2023-09-02 DIAGNOSIS — I5033 Acute on chronic diastolic (congestive) heart failure: Secondary | ICD-10-CM | POA: Diagnosis not present

## 2023-09-02 DIAGNOSIS — I2729 Other secondary pulmonary hypertension: Secondary | ICD-10-CM | POA: Diagnosis not present

## 2023-09-02 DIAGNOSIS — N1832 Chronic kidney disease, stage 3b: Secondary | ICD-10-CM | POA: Diagnosis not present

## 2023-09-02 LAB — CBC
HCT: 26.6 % — ABNORMAL LOW (ref 36.0–46.0)
Hemoglobin: 8.6 g/dL — ABNORMAL LOW (ref 12.0–15.0)
MCH: 36 pg — ABNORMAL HIGH (ref 26.0–34.0)
MCHC: 32.3 g/dL (ref 30.0–36.0)
MCV: 111.3 fL — ABNORMAL HIGH (ref 80.0–100.0)
Platelets: 160 10*3/uL (ref 150–400)
RBC: 2.39 MIL/uL — ABNORMAL LOW (ref 3.87–5.11)
RDW: 13.5 % (ref 11.5–15.5)
WBC: 6 10*3/uL (ref 4.0–10.5)
nRBC: 0 % (ref 0.0–0.2)

## 2023-09-02 LAB — BASIC METABOLIC PANEL
Anion gap: 8 (ref 5–15)
BUN: 56 mg/dL — ABNORMAL HIGH (ref 8–23)
CO2: 35 mmol/L — ABNORMAL HIGH (ref 22–32)
Calcium: 8.6 mg/dL — ABNORMAL LOW (ref 8.9–10.3)
Chloride: 91 mmol/L — ABNORMAL LOW (ref 98–111)
Creatinine, Ser: 1.16 mg/dL — ABNORMAL HIGH (ref 0.44–1.00)
GFR, Estimated: 46 mL/min — ABNORMAL LOW (ref >60)
Glucose, Bld: 95 mg/dL (ref 70–99)
Potassium: 3.8 mmol/L (ref 3.5–5.1)
Sodium: 134 mmol/L — ABNORMAL LOW (ref 135–145)

## 2023-09-02 MED ORDER — PANTOPRAZOLE SODIUM 40 MG PO TBEC
40.0000 mg | DELAYED_RELEASE_TABLET | Freq: Every day | ORAL | Status: DC
  Administered 2023-09-02 – 2023-09-03 (×2): 40 mg via ORAL
  Filled 2023-09-02 (×3): qty 1

## 2023-09-02 MED ORDER — DIPHENHYDRAMINE-ZINC ACETATE 2-0.1 % EX CREA
TOPICAL_CREAM | Freq: Two times a day (BID) | CUTANEOUS | Status: DC | PRN
  Filled 2023-09-02: qty 28

## 2023-09-02 NOTE — Progress Notes (Signed)
09/02/2023     I have seen and evaluated the patient for complex CHF, pHTN   S:   No events, worried about going home with new med regimen and was hoping to stay one more day to assure no untoward effects.   O: Blood pressure 96/66, pulse 64, temperature 97.6 F (36.4 C), temperature source Oral, resp. rate 17, height 5\' 3"  (1.6 m), weight 81.6 kg, SpO2 94 %.    Thin frail woman in NAD Moves to command Lungs diminished +muscle wasting, no edema Ext warm AOx3   Labs reviewed  A:  CHF and ILD on chronic O2- presumed that opsumit may be precipitating recurrent admissions; it has been Dc'd  Large pericardial effusion- no tamponade at present   P:  Seems to be nearing readiness for DC, agree with her that one more day on stable med regimen is reasonable given lack of support at home for her aging husband (she is sole caregiver).     Will message Dr. Judeth Horn to see if he can fit her in (virtual/phone or in person) before the rescheduled January Appt.  Available PRN   Myrla Halsted MD  Pulmonary Critical Care Prefer epic messenger for cross cover needs If after hours, please call E-link

## 2023-09-02 NOTE — Telephone Encounter (Signed)
I called PT to offer a Virtual appt w/Dr. Isaiah Serge on he 22nd of Nov. Pt states she wants an in person visit Monday or Wednesday bet 12 & 4.

## 2023-09-02 NOTE — Progress Notes (Addendum)
Progress Note  Patient Name: Paula Ward Date of Encounter: 09/02/2023  Primary Cardiologist: Olga Millers, MD  Subjective   She feels so much better than when she first came in. She denies any CP or SOB. No palpitations.  She confirms she was on Lasix 60mg  daily at home but wasn't taking Jardiance because she didn't like how it made her feel. However, she is tolerating it here in the hospital and wants to re-try taking at DC. She states she's requested DC tomorrow since she is making arrangements for care at home. Per her request, we will move her follow-up appointment with Verdon Cummins in our office to next Wednesday.  Inpatient Medications    Scheduled Meds:  diltiazem  30 mg Oral Q12H   docusate sodium  100 mg Oral BID   empagliflozin  10 mg Oral Daily   enoxaparin (LOVENOX) injection  30 mg Subcutaneous Daily   famotidine  20 mg Oral QHS   fluticasone  1 spray Each Nare q AM   furosemide  40 mg Oral Daily   montelukast  10 mg Oral QHS   pantoprazole  40 mg Oral Daily   sodium chloride flush  3 mL Intravenous Q12H   spironolactone  12.5 mg Oral Daily   Continuous Infusions:  PRN Meds: acetaminophen **OR** acetaminophen, albuterol, alum & mag hydroxide-simeth, diphenhydrAMINE-zinc acetate, ondansetron **OR** ondansetron (ZOFRAN) IV, senna-docusate, sucralfate   Vital Signs    Vitals:   09/02/23 0449 09/02/23 0451 09/02/23 2034 09/02/23 2143  BP:  (!) 120/57 (!) 125/51 (!) 115/44  Pulse:  77 90 79  Resp:   18   Temp:  97.9 F (36.6 C) 98.6 F (37 C)   TempSrc:  Oral Oral   SpO2:  100% 95%   Weight: 44.5 kg     Height:        Intake/Output Summary (Last 24 hours) at 09/02/2023 2257 Last data filed at 09/02/2023 2226 Gross per 24 hour  Intake 698 ml  Output 1325 ml  Net -627 ml      09/02/2023    4:49 AM 09/01/2023   11:00 AM 08/31/2023    5:13 AM  Last 3 Weights  Weight (lbs) 98 lb 1.7 oz 98 lb 8.7 oz 107 lb 2.3 oz  Weight (kg) 44.5 kg 44.7 kg 48.6 kg      Telemetry    NSR - Personally Reviewed  Physical Exam   GEN: No acute distress.  HEENT: Normocephalic, atraumatic, sclera non-icteric. Neck: No JVD or bruits. Cardiac: RRR, 2/6 SEM best heard RUSB, no rubs or gallops.  Respiratory: Coarse crackly BS bilaterally. Breathing is unlabored. GI: Soft, nontender, non-distended, BS +x 4. MS: no deformity. Extremities: No clubbing or cyanosis. No edema. Distal pedal pulses are 2+ and equal bilaterally. Neuro:  AAOx3. Follows commands. Psych:  Responds to questions appropriately with a normal affect.  Labs    High Sensitivity Troponin:   Recent Labs  Lab 08/14/23 0636 08/14/23 0828 08/29/23 1613 08/29/23 2133  TROPONINIHS 26* 27* 22* 23*      Cardiac EnzymesNo results for input(s): "TROPONINI" in the last 168 hours. No results for input(s): "TROPIPOC" in the last 168 hours.   Chemistry Recent Labs  Lab 08/29/23 1613 08/30/23 0410 08/31/23 0422 09/01/23 0405 09/02/23 0409  NA 130*   < > 131* 134* 134*  K 4.7   < > 4.1 3.8 3.8  CL 91*   < > 88* 91* 91*  CO2 31   < > 34*  33* 35*  GLUCOSE 95   < > 95 90 95  BUN 49*   < > 61* 49* 56*  CREATININE 1.33*   < > 1.23* 1.02* 1.16*  CALCIUM 8.4*   < > 8.5* 8.6* 8.6*  PROT 6.8  --   --   --   --   ALBUMIN 3.9  --   --   --   --   AST 27  --   --   --   --   ALT 13  --   --   --   --   ALKPHOS 54  --   --   --   --   BILITOT 1.0  --   --   --   --   GFRNONAA 39*   < > 43* 54* 46*  ANIONGAP 8   < > 9 10 8    < > = values in this interval not displayed.     Hematology Recent Labs  Lab 08/31/23 0422 09/01/23 0405 09/02/23 0409  WBC 8.5 7.2 6.0  RBC 2.48* 2.44* 2.39*  HGB 8.8* 8.5* 8.6*  HCT 27.4* 27.0* 26.6*  MCV 110.5* 110.7* 111.3*  MCH 35.5* 34.8* 36.0*  MCHC 32.1 31.5 32.3  RDW 13.4 13.5 13.5  PLT 159 155 160    BNP Recent Labs  Lab 08/29/23 1613  BNP 2,168.5*     DDimer No results for input(s): "DDIMER" in the last 168 hours.   Radiology    No  results found.  Cardiac Studies   Limited echo 08/31/23   1. Left ventricular ejection fraction, by estimation, is 60 to 65%. The  left ventricle has normal function. The left ventricle has no regional  wall motion abnormalities.   2. Right ventricular systolic function is normal. The right ventricular  size is normal.   3. Left atrial size was mildly dilated.   4. Severe mitral annular calcification. Visually there is mitral stenosis  but doppler interrogation was not done.   5. The aortic valve is tricuspid. There is severe calcifcation of the  aortic valve. Visually there is aortic stenosis but doppler interrogation  was not done.   6. The inferior vena cava is normal in size with <50% respiratory  variability, suggesting right atrial pressure of 8 mmHg.   7. There was a large circumferential pericardial effusion. There was no  RV diastolic collapse noted. Mitral E inflow velocity showed < 25%  respirophasic variation. The IVC was not significantly dilated. There does  not appear to be tamponade physiology.   8. Limited echo.    2d echo 08/15/23  1. Left ventricular ejection fraction, by estimation, is >75%. The left  ventricle has hyperdynamic function. The left ventricle has no regional  wall motion abnormalities. There is severe left ventricular hypertrophy.  Left ventricular diastolic  parameters are consistent with Grade I diastolic dysfunction (impaired  relaxation).   2. Right ventricular systolic function is normal. The right ventricular  size is normal.   3. Left atrial size was severely dilated.   4. Large pericardial effusion. The pericardial effusion is  circumferential. There is no evidence of cardiac tamponade.   5. The mitral valve is normal in structure. No evidence of mitral valve  regurgitation. No evidence of mitral stenosis. Severe mitral annular  calcification.   6. The aortic valve is normal in structure. There is severe calcifcation  of the aortic  valve. There is moderate thickening of the aortic valve.  Aortic valve  regurgitation is not visualized. Mild aortic valve stenosis.  Aortic valve mean gradient measures   14.8 mmHg. Aortic valve Vmax measures 2.59 m/s.   7. The inferior vena cava is normal in size with greater than 50%  respiratory variability, suggesting right atrial pressure of 3 mmHg.   Comparison(s): Large pericardial effusion noted on prior ECHO, no major  change.   Conclusion(s)/Recommendation(s): Findings consistent with hypertrophic  cardiomyopathy.     Patient Profile      87 y.o. female with complex history of chronic HFpEF, large pericardial effusion dating back to 2003 per notes, HTN, mild-moderate aortic stenosis, pulmonary HTN (previously followed at 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 Recent admission last month for a/c CHF and possible cellulitis. Readmitted with weight gain, orthopnea, edema with CXR showing improved but persistent CHF, small pleural effusions.    Assessment & Plan    1. 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 Odis Hollingshead to advise on discharge dose as home Lasix was 60mg  daily prior to admission, but was not taking Jardiance - this was resumed here and she is agreeable to taking at DC - Dr. Odis Hollingshead also started cardizem 30 mg p.o. twice daily with holding parameters given her ventricular rate and echo findings  - SGLT2i restarted 08/31/23 - continued on  spironolactone - given finding of severe LVH on echo 07/2023, await input from MD if additional imaging is needed - pulmonary service has stopped Opsumit in case this is precipitating recurrent admissions  2. Chronic large pericardial effusion - longstanding for patient with repeat limited echo this admission showing no evidence of cardiac tamponade, follow clinically   3. Aortic stenosis - moderate by 2023 cath, mild on 07/2023 echo, follow clinicially   4. Acute on chronic macrocytic anemia - Hgb 8's, down from 9's, follows with heme-onc as outpatient - per primary team   5. Minimally elevated troponin - suspected demand ischemia, do not suspect ACS   6. CKD 3b with hyponatremia - stable   Per pt request, moved f/u appt to 09/09/23 and outlined change on AVS  For questions or updates, please contact [facility] Please consult [email or web address] for contact info under Cardiology/STEMI.  Signed, Laurann Montana, PA-C 09/02/2023,   ADDENDUM:   Patient seen and examined with APP, Dayna N Dunn, PA-C.  I personally taken a history, examined the patient, reviewed relevant notes,  laboratory data / imaging studies.  I performed a substantive portion of this encounter and formulated the important aspects of the plan.  I agree with the APP's note, impression, and recommendations; however, I have edited the note to reflect changes or salient points.  Shortness of breath has significantly improved.  Able to lay flat.Back to baseline.  PHYSICAL EXAM: Today's Vitals   09/02/23 0451 09/02/23 0900 09/02/23 2034 09/02/23 2143  BP: (!) 120/57  (!) 125/51 (!) 115/44  Pulse: 77  90 79  Resp:   18   Temp: 97.9 F (36.6 C)  98.6 F (37 C)   TempSrc: Oral  Oral   SpO2: 100%  95%   Weight:      Height:      PainSc:  0-No pain  0-No pain   Body mass index is 17.38 kg/m.   Net IO Since Admission: -3,107 mL [09/02/23 2257]  Filed Weights   08/31/23 0513 09/01/23 1100 09/02/23 0449   Weight: 48.6 kg 44.7 kg 44.5 kg    Physical Exam  Constitutional: No distress.  hemodynamically stable  Neck: No JVD present.  Cardiovascular: Normal rate, regular rhythm, S1 normal and S2 normal. Exam reveals no gallop, no S3 and no S4.  Murmur heard. Midsystolic murmur is present with a grade of 3/6 at the upper right sternal border. Rumbling diastolic murmur is present with a grade of 3/4 at the apex. Pulmonary/Chest: Effort normal. No stridor. She has no wheezes. She has bibasilar rales.  Abdominal: Soft. Bowel sounds are normal. She exhibits no distension. There is no abdominal tenderness.  Musculoskeletal:        General: No edema.     Cervical back: Neck supple.  Neurological: She is alert and oriented to person, place, and time. She has intact cranial nerves (2-12).  Skin: Skin is warm.    EKG: (personally reviewed by me) No new EKG  Telemetry: (personally reviewed by me) SR w/o ectopy    Impression:  Acute on chronic heart failure with preserved EF Chronic pericardial effusion. 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 Clinically is back to baseline and now euvolemic. Continue spironolactone 12.5 mg p.o. daily. Continue Jardiance 10 mg p.o. daily. Continue Cardizem 30 mg p.o. twice daily. At follow-up consider starting ARB if blood pressure labs allow. Continue Lasix 40 mg p.o. daily. I have asked her to check her weights daily.  If the weight gains is more than 1 pound over day or 3 pounds over a week have asked her to take an additional 20 mg dose Lasix until she is back to her baseline weight.  She requiring frequent that the additional 20 mg of Lasix labs and medications will need to be retitrated.  Patient verbalizes understanding  Pericardial effusion: Chronic. No tamponade  physiology on current echo  Pulmonary arterial hypertension. Scleroderma. ILD Patient was on Opsumit prior to arrival.  Patient seen by pulmonary medicine and the medication has been discontinued.  Will defer management of PAH to attending physician plus or minus consult  Aortic stenosis: Monitor for now  Further recommendations to follow as the case evolves.   This note was created using a voice recognition software as a result there may be grammatical errors inadvertently enclosed that do not reflect the nature of this encounter. Every attempt is made to correct such errors.   Tessa Lerner, DO, [facility]  [address] [place], [place] [address] Pager: [phone] Office: [phone] 09/02/2023 10:57 PM

## 2023-09-02 NOTE — Telephone Encounter (Signed)
11/27 at 130? Ok for 15 min slot

## 2023-09-02 NOTE — Plan of Care (Signed)
  Problem: Education: Goal: Ability to demonstrate management of disease process will improve Outcome: Progressing Goal: Ability to verbalize understanding of medication therapies will improve Outcome: Progressing Goal: Individualized Educational Video(s) Outcome: Progressing   Problem: Activity: Goal: Capacity to carry out activities will improve Outcome: Progressing   

## 2023-09-02 NOTE — Progress Notes (Signed)
Occupational Therapy Treatment Patient Details Name: Paula Ward MRN: [id number] DOB: [birth year] Today's Date: 09/02/2023   History of present illness 87 yr old female presents to therapy following hospital admission on 08/29/2023 due to progressive SOB and 3# weight gain overnight. Pt recently admitted to hospital 10/24-10/26 for CHF. Pt is on 3 L/min supplemental O2 baseline. CXR noted for cardiomegaly, mild CHF and pleural effusions. Pt admitted for CHF. Pt PMH includes but is not limited to: anemia, angiodysplasia of stomach and duodenum, arthus phenomenon, L ba ca, PE, pulmonary HTN, renal cell carcinoma, and  L IM nail (2023).   OT comments  The pt presented with good effort and participation. She progressed to performing ambulating in her room without an assistive device, performing toileting at bathroom level, and performing grooming in standing at sink level. She was noted to be on 3L O2 via nasal cannula and she reported such use at her baseline. She is making gradual functional progress. Continue OT plan of care.       If plan is discharge home, recommend the following:  Assistance with cooking/housework;Assist for transportation;A little help with bathing/dressing/bathroom   Equipment Recommendations  None recommended by OT    Recommendations for Other Services      Precautions / Restrictions Restrictions Weight Bearing Restrictions: No       Mobility Bed Mobility Overal bed mobility: Needs Assistance Bed Mobility: Supine to Sit, Sit to Supine     Supine to sit: Supervision Sit to supine: Supervision        Transfers Overall transfer level: Needs assistance Equipment used: None Transfers: Sit to/from Stand Sit to Stand: Supervision                     ADL either performed or assessed with clinical judgement   ADL Overall ADL's : Needs assistance/impaired     Grooming: Contact guard assist;Standing Grooming Details (indicate cue type and  reason): The pt performed hand washing in standing at bathroom level.                 Toilet Transfer: Contact guard assist;Regular Toilet;Grab bars;Ambulation Toilet Transfer Details (indicate cue type and reason): The pt ambulated to the bathroom in her to perform toileting tasks. Toileting- Clothing Manipulation and Hygiene: Contact guard assist;Sit to/from stand Toileting - Clothing Manipulation Details (indicate cue type and reason): The pt performed all toileting tasks with SBA to light CGA, including clothing management and hygiene in standing.                       Cognition Arousal: Alert Behavior During Therapy: WFL for tasks assessed/performed Overall Cognitive Status: Within Functional Limits for tasks assessed                        Pertinent Vitals/ Pain       Pain Assessment Pain Assessment: No/denies pain         Frequency  Min 1X/week        Progress Toward Goals  OT Goals(current goals can now be found in the care plan section)  Progress towards OT goals: Progressing toward goals  Acute Rehab OT Goals OT Goal Formulation: With patient Time For Goal Achievement: 09/14/23 Potential to Achieve Goals: Good  Plan         AM-PAC OT "6 Clicks" Daily Activity     Outcome Measure   Help from another person eating meals?: None Help from  another person taking care of personal grooming?: None Help from another person toileting, which includes using toliet, bedpan, or urinal?: A Little Help from another person bathing (including washing, rinsing, drying)?: A Little Help from another person to put on and taking off regular upper body clothing?: None Help from another person to put on and taking off regular lower body clothing?: A Little 6 Click Score: 21    End of Session Equipment Utilized During Treatment: Oxygen  OT Visit Diagnosis: Muscle weakness (generalized) (M62.81)   Activity Tolerance Patient tolerated treatment well    Patient Left in bed;with call bell/phone within reach   Nurse Communication Mobility status        Time:  -     Charges:      Reuben Likes 09/02/2023, 9:01 AM

## 2023-09-02 NOTE — Telephone Encounter (Signed)
Hoping this lady can get a followup in next 2-3 weeks either phone visit or in person given recurrent admissions.

## 2023-09-02 NOTE — Telephone Encounter (Signed)
Attempted to call patient, unable to leave voicemail. Scheduled appointment on 11/27 at 1:30pm. Will mail letter to patient.

## 2023-09-02 NOTE — Progress Notes (Signed)
Physical Therapy Treatment Patient Details Name: Paula Ward MRN: [id number] DOB: 16-Feb-1936 Today's Date: 09/02/2023   History of Present Illness 87 yo female presents to therapy following hospital admission on 08/29/2023 due to progressive SOB and 3# weight gain overnight. Pt recently admitted to hospital 10/24-10/26 for CHF. Pt is on 3 L/min supplemental O2 baseline. CXR noted for cardiomegaly, mild CHF and pleural effusions. Pt admitted for CHF. Pt PMH includes but is not limited to: anemia, angiodysplasia of stomach and duodenum, arthus phenomenon, L ba ca, PE, pulmonary HTN, renal cell carcinoma, and  L IM nail (2023).    PT Comments  Pt motivated to mobilize, amb 200 ft with SPC, history of leg length discrepancy without shoe lift present. Pt amb with decreased cadence, slight increased lateral sway without overt LOB, pt able to navigate around obstacles without LOB or physical assistance needed. Return to room and pt with phone call; left in recliner with all needs in reach. Pt on 3L O2 with SpO2 100% at rest, 91% immediately post amb, 98% within 1 minute of seated rest.   If plan is discharge home, recommend the following: A little help with walking and/or transfers;A little help with bathing/dressing/bathroom;Assistance with cooking/housework;Assist for transportation;Help with stairs or ramp for entrance   Can travel by private vehicle        Equipment Recommendations  None recommended by PT    Recommendations for Other Services       Precautions / Restrictions Precautions Precautions: Fall Restrictions Weight Bearing Restrictions: No     Mobility  Bed Mobility               General bed mobility comments: in recliner    Transfers Overall transfer level: Needs assistance Equipment used: Straight cane Transfers: Sit to/from Stand Sit to Stand: Supervision           General transfer comment: supv, therapist managing O2 tubing     Ambulation/Gait Ambulation/Gait assistance: Supervision Gait Distance (Feet): 200 Feet Assistive device: Straight cane Gait Pattern/deviations: Step-to pattern, Decreased stance time - left, Decreased stride length Gait velocity: decreased     General Gait Details: step-to due to leg length discrepancy, using SPC to steady self without LOB, decreased cadence with slight lateral sway at times not needing assistance to maneuver around objects or striaghtline gait   Stairs             Wheelchair Mobility     Tilt Bed    Modified Rankin (Stroke Patients Only)       Balance Overall balance assessment: Mild deficits observed, not formally tested                                          Cognition Arousal: Alert Behavior During Therapy: WFL for tasks assessed/performed Overall Cognitive Status: Within Functional Limits for tasks assessed                                          Exercises      General Comments General comments (skin integrity, edema, etc.): Pt on 3L O2 with SpO2 100%, post amb 91%, improves to 98% within 1 minute of seated rest, HR 80s-90s      Pertinent Vitals/Pain Pain Assessment Pain Assessment: No/denies pain    Home Living  Prior Function            PT Goals (current goals can now be found in the care plan section) Acute Rehab PT Goals Patient Stated Goal: to be able to do everything I was doing before PT Goal Formulation: With patient Time For Goal Achievement: 09/14/23 Potential to Achieve Goals: Good    Frequency    Min 1X/week      PT Plan      Co-evaluation              AM-PAC PT "6 Clicks" Mobility   Outcome Measure  Help needed turning from your back to your side while in a flat bed without using bedrails?: None Help needed moving from lying on your back to sitting on the side of a flat bed without using bedrails?: A Little Help needed  moving to and from a bed to a chair (including a wheelchair)?: A Little Help needed standing up from a chair using your arms (e.g., wheelchair or bedside chair)?: A Little Help needed to walk in hospital room?: A Little Help needed climbing 3-5 steps with a railing? : A Little 6 Click Score: 19    End of Session Equipment Utilized During Treatment: Gait belt;Oxygen Activity Tolerance: Patient tolerated treatment well Patient left: in chair;with call bell/phone within reach Nurse Communication: Mobility status PT Visit Diagnosis: Unsteadiness on feet (R26.81);Difficulty in walking, not elsewhere classified (R26.2)     Time: 1610-9604 PT Time Calculation (min) (ACUTE ONLY): 17 min  Charges:    $Gait Training: 8-22 mins PT General Charges $$ ACUTE PT VISIT: 1 Visit                     Tori Perlie Stene PT, DPT 09/02/23, 11:47 AM

## 2023-09-02 NOTE — Progress Notes (Signed)
PROGRESS NOTE  Paula Ward XBM:[id number] DOB: 01-19-1936 DOA: 08/29/2023 PCP: [name], MD  Brief narrative: Paula Ward is a 87 y.o. female with medical history significant for hypertension, CKD 3b, interstitial lung disease, pulmonary hypertension, chronic hypoxic respiratory failure on 3L of O2, chronic anemia, and chronic diastolic CHF presented to hospital with shortness of breath, weight gain, and orthopnea for about 1 week. Due to this, patient called her cardiology office, and was advised to go to the ED. In the ED, patient is found to be afebrile and saturating well on 3 L of O2. EKG demonstrates SR with first-degree AV nodal block.  Chest x-ray is notable for cardiomegaly, mild CHF, and pleural effusions.  Labs are most notable for sodium 130, creatinine 1.33, negative respiratory virus panel, and hemoglobin 8.5. Pt was then admitted for further management.  Assessment/Plan: Principal Problem:   Acute on chronic diastolic (congestive) heart failure (HCC) Active Problems:   Chronic respiratory failure with hypoxia and hypercapnia (HCC)   Other secondary pulmonary hypertension (HCC)   Chronic kidney disease, stage 3b (HCC)   Macrocytic anemia   Essential hypertension   Hyponatremia   Acute heart failure with preserved ejection fraction (HFpEF) (HCC)   Dependence on supplemental oxygen   Elevated troponin I level   Nonrheumatic aortic valve stenosis   Scleroderma (HCC)   Chronic pericardial effusion   Pulmonary arterial hypertension (HCC)   Acute on chronic diastolic CHF  Large pericardial effusion-  not in tamponade BNP at baseline.  2D ECHO from 07/2023: EF was >75% with grade 1DD, severe LAE, large pericardial effusion, and mild AS.  Patient initially received IV Lasix.  Currently on oral Lasix. Cardiology was consulted and plan is to continue aldactone, started jardiance and cardizem.  Plan for ARBs as an outpatient.  Continue intake and output  charting, daily BMP.  Chronic hypoxic respiratory failure- on 3L of O2 at baseline  ILD Pulm HTN Continue oxygen supplementation.  Pulmonary on board. Plan is to stop Opsumit based on increasing frequency of left heart failure episodes and anemia Recommend pulmonary follow-up as outpatient.  Hyponatremia  Likely in setting of hypervolemia.  Will continue to monitor.   CKD 3B  Creatinine of 1.1.  Will continue to monitor.  Likely at baseline    Macrocytic/Anemia of chronic kidney disease Hemoglobin of 8.6.  Continue to monitor     Code Status: Full  Family Communication: None at bedside   Disposition Plan: Status is: Inpatient  Remains inpatient appropriate because: Pending clinical improvement   Consultants: Cardiology Pulmonology  Procedures: None  Antimicrobials: None  DVT prophylaxis:  Lovenox subcu  Subjective Today, patient was seen and examined at bedside.  Patient complains of heartburn after taking her pills.  Feels weak.  Objective: Vitals:   09/01/23 2054 09/01/23 2144 09/02/23 0449 09/02/23 0451  BP: (!) 114/50 (!) 121/48  (!) 120/57  Pulse: 87 81  77  Resp: 14     Temp: 97.7 F (36.5 C) 98 F (36.7 C)  97.9 F (36.6 C)  TempSrc: Oral Oral  Oral  SpO2: 98%   100%  Weight:   44.5 kg   Height:        Intake/Output Summary (Last 24 hours) at 09/02/2023 1300 Last data filed at 09/02/2023 1019 Gross per 24 hour  Intake 630 ml  Output 800 ml  Net -170 ml   Filed Weights   08/31/23 0513 09/01/23 1100 09/02/23 0449  Weight: 48.6 kg 44.7 kg  44.5 kg    Physical exam:  Body mass index is 17.38 kg/m.  General: Thinly built, not in obvious distress, appears anxious HENT:   No scleral pallor or icterus noted. Oral mucosa is moist.  Chest:   Diminished breath sounds bilaterally.  Coarse breath sounds noted. CVS: S1 &S2 heard. No murmur.  Regular rate and rhythm. Abdomen: Soft, nontender, nondistended.  Bowel sounds are heard.   Extremities:  No cyanosis, clubbing or edema.  Peripheral pulses are palpable. Psych: Alert, awake and oriented, anxious CNS:  No cranial nerve deficits.  Power equal in all extremities.   Skin: Warm and dry.  No rashes noted.  Data Reviewed: CBC: Recent Labs  Lab 08/29/23 1613 08/30/23 0410 08/31/23 0422 09/01/23 0405 09/02/23 0409  WBC 6.7 5.7 8.5 7.2 6.0  NEUTROABS 5.0  --   --   --   --   HGB 8.5* 8.3* 8.8* 8.5* 8.6*  HCT 26.5* 26.7* 27.4* 27.0* 26.6*  MCV 111.3* 112.7* 110.5* 110.7* 111.3*  PLT 157 152 159 155 160   Basic Metabolic Panel: Recent Labs  Lab 08/29/23 1613 08/30/23 0410 08/31/23 0422 09/01/23 0405 09/02/23 0409  NA 130* 131* 131* 134* 134*  K 4.7 3.9 4.1 3.8 3.8  CL 91* 91* 88* 91* 91*  CO2 31 31 34* 33* 35*  GLUCOSE 95 99 95 90 95  BUN 49* 50* 61* 49* 56*  CREATININE 1.33* 1.06* 1.23* 1.02* 1.16*  CALCIUM 8.4* 8.7* 8.5* 8.6* 8.6*  MG 2.3  --   --   --   --    GFR: Estimated Creatinine Clearance: 24.9 mL/min (A) (by C-G formula based on SCr of 1.16 mg/dL (H)). Liver Function Tests: Recent Labs  Lab 08/29/23 1613  AST 27  ALT 13  ALKPHOS 54  BILITOT 1.0  PROT 6.8  ALBUMIN 3.9   No results for input(s): "LIPASE", "AMYLASE" in the last 168 hours. No results for input(s): "AMMONIA" in the last 168 hours. Coagulation Profile: No results for input(s): "INR", "PROTIME" in the last 168 hours. Cardiac Enzymes: No results for input(s): "CKTOTAL", "CKMB", "CKMBINDEX", "TROPONINI" in the last 168 hours. BNP (last 3 results) Recent Labs    05/13/23 1507 06/03/23 1200  PROBNP 1,750.0* 2,680.0*   HbA1C: No results for input(s): "HGBA1C" in the last 72 hours. CBG: No results for input(s): "GLUCAP" in the last 168 hours. Lipid Profile: No results for input(s): "CHOL", "HDL", "LDLCALC", "TRIG", "CHOLHDL", "LDLDIRECT" in the last 72 hours. Thyroid Function Tests: No results for input(s): "TSH", "T4TOTAL", "FREET4", "T3FREE", "THYROIDAB" in the last 72  hours. Anemia Panel: No results for input(s): "VITAMINB12", "FOLATE", "FERRITIN", "TIBC", "IRON", "RETICCTPCT" in the last 72 hours. Urine analysis:    Component Value Date/Time   COLORURINE YELLOW 03/13/2023 1512   APPEARANCEUR CLEAR 03/13/2023 1512   LABSPEC 1.020 03/13/2023 1512   PHURINE 5.5 03/13/2023 1512   GLUCOSEU NEGATIVE 03/13/2023 1512   HGBUR NEGATIVE 03/13/2023 1512   BILIRUBINUR neg 08/03/2023 1351   KETONESUR negative 04/03/2023 1532   KETONESUR TRACE (A) 03/13/2023 1512   PROTEINUR Negative 08/03/2023 1351   PROTEINUR NEGATIVE 03/30/2021 1416   UROBILINOGEN negative (A) 08/03/2023 1351   UROBILINOGEN 1.0 03/13/2023 1512   NITRITE neg 08/03/2023 1351   NITRITE NEGATIVE 03/13/2023 1512   LEUKOCYTESUR Negative 08/03/2023 1351   LEUKOCYTESUR NEGATIVE 03/13/2023 1512   Sepsis Labs: @LABRCNTIP (procalcitonin:4,lacticidven:4)  ) Recent Results (from the past 240 hour(s))  Resp panel by RT-PCR (RSV, Flu A&B, Covid) Anterior Nasal Swab  Status: None   Collection Time: 08/29/23  4:25 PM   Specimen: Anterior Nasal Swab  Result Value Ref Range Status   SARS Coronavirus 2 by RT PCR NEGATIVE NEGATIVE Final    Comment: (NOTE) SARS-CoV-2 target nucleic acids are NOT DETECTED.  The SARS-CoV-2 RNA is generally detectable in upper respiratory specimens during the acute phase of infection. The lowest concentration of SARS-CoV-2 viral copies this assay can detect is 138 copies/mL. A negative result does not preclude SARS-Cov-2 infection and should not be used as the sole basis for treatment or other patient management decisions. A negative result may occur with  improper specimen collection/handling, submission of specimen other than nasopharyngeal swab, presence of viral mutation(s) within the areas targeted by this assay, and inadequate number of viral copies(<138 copies/mL). A negative result must be combined with clinical observations, patient history, and  epidemiological information. The expected result is Negative.  Fact Sheet for Patients:  BloggerCourse.com  Fact Sheet for Healthcare Providers:  SeriousBroker.it  This test is no t yet approved or cleared by the [place] FDA and  has been authorized for detection and/or diagnosis of SARS-CoV-2 by FDA under an Emergency Use Authorization (EUA). This EUA will remain  in effect (meaning this test can be used) for the duration of the COVID-19 declaration under Section 564(b)(1) of the Act, 21 U.S.C.section 360bbb-3(b)(1), unless the authorization is terminated  or revoked sooner.       Influenza A by PCR NEGATIVE NEGATIVE Final   Influenza B by PCR NEGATIVE NEGATIVE Final    Comment: (NOTE) The Xpert Xpress SARS-CoV-2/FLU/RSV plus assay is intended as an aid in the diagnosis of influenza from Nasopharyngeal swab specimens and should not be used as a sole basis for treatment. Nasal washings and aspirates are unacceptable for Xpert Xpress SARS-CoV-2/FLU/RSV testing.  Fact Sheet for Patients: BloggerCourse.com  Fact Sheet for Healthcare Providers: SeriousBroker.it  This test is not yet approved or cleared by the [place] FDA and has been authorized for detection and/or diagnosis of SARS-CoV-2 by FDA under an Emergency Use Authorization (EUA). This EUA will remain in effect (meaning this test can be used) for the duration of the COVID-19 declaration under Section 564(b)(1) of the Act, 21 U.S.C. section 360bbb-3(b)(1), unless the authorization is terminated or revoked.     Resp Syncytial Virus by PCR NEGATIVE NEGATIVE Final    Comment: (NOTE) Fact Sheet for Patients: BloggerCourse.com  Fact Sheet for Healthcare Providers: SeriousBroker.it  This test is not yet approved or cleared by the [place] FDA and has been  authorized for detection and/or diagnosis of SARS-CoV-2 by FDA under an Emergency Use Authorization (EUA). This EUA will remain in effect (meaning this test can be used) for the duration of the COVID-19 declaration under Section 564(b)(1) of the Act, 21 U.S.C. section 360bbb-3(b)(1), unless the authorization is terminated or revoked.  Performed at [facility], [address], [place], [place] [address]      Studies: No results found.  Scheduled Meds:  diltiazem  30 mg Oral Q12H   docusate sodium  100 mg Oral BID   empagliflozin  10 mg Oral Daily   enoxaparin (LOVENOX) injection  30 mg Subcutaneous Daily   famotidine  20 mg Oral QHS   fluticasone  1 spray Each Nare q AM   furosemide  40 mg Oral Daily   latanoprost  1 drop Both Eyes QHS   montelukast  10 mg Oral QHS   pantoprazole  40 mg Oral  Daily   sodium chloride flush  3 mL Intravenous Q12H   spironolactone  12.5 mg Oral Daily    Continuous Infusions:   LOS: 4 days    Joycelyn Das, MD [facility] If 7PM-7AM, please contact night-coverage [email or web address] 09/02/2023, 1:00 PM

## 2023-09-03 ENCOUNTER — Ambulatory Visit: Payer: Medicare PPO | Admitting: General Practice

## 2023-09-03 DIAGNOSIS — N1832 Chronic kidney disease, stage 3b: Secondary | ICD-10-CM | POA: Diagnosis not present

## 2023-09-03 DIAGNOSIS — J9611 Chronic respiratory failure with hypoxia: Secondary | ICD-10-CM | POA: Diagnosis not present

## 2023-09-03 DIAGNOSIS — I2729 Other secondary pulmonary hypertension: Secondary | ICD-10-CM | POA: Diagnosis not present

## 2023-09-03 DIAGNOSIS — I5033 Acute on chronic diastolic (congestive) heart failure: Secondary | ICD-10-CM | POA: Diagnosis not present

## 2023-09-03 LAB — CBC
HCT: 27 % — ABNORMAL LOW (ref 36.0–46.0)
Hemoglobin: 8.3 g/dL — ABNORMAL LOW (ref 12.0–15.0)
MCH: 34.6 pg — ABNORMAL HIGH (ref 26.0–34.0)
MCHC: 30.7 g/dL (ref 30.0–36.0)
MCV: 112.5 fL — ABNORMAL HIGH (ref 80.0–100.0)
Platelets: 159 10*3/uL (ref 150–400)
RBC: 2.4 MIL/uL — ABNORMAL LOW (ref 3.87–5.11)
RDW: 13.6 % (ref 11.5–15.5)
WBC: 5.4 10*3/uL (ref 4.0–10.5)
nRBC: 0 % (ref 0.0–0.2)

## 2023-09-03 LAB — BASIC METABOLIC PANEL
Anion gap: 10 (ref 5–15)
BUN: 55 mg/dL — ABNORMAL HIGH (ref 8–23)
CO2: 32 mmol/L (ref 22–32)
Calcium: 8.7 mg/dL — ABNORMAL LOW (ref 8.9–10.3)
Chloride: 92 mmol/L — ABNORMAL LOW (ref 98–111)
Creatinine, Ser: 1.05 mg/dL — ABNORMAL HIGH (ref 0.44–1.00)
GFR, Estimated: 52 mL/min — ABNORMAL LOW (ref >60)
Glucose, Bld: 101 mg/dL — ABNORMAL HIGH (ref 70–99)
Potassium: 4.1 mmol/L (ref 3.5–5.1)
Sodium: 134 mmol/L — ABNORMAL LOW (ref 135–145)

## 2023-09-03 LAB — MAGNESIUM: Magnesium: 2.4 mg/dL (ref 1.7–2.4)

## 2023-09-03 MED ORDER — ALBUTEROL SULFATE (2.5 MG/3ML) 0.083% IN NEBU: 2.5000 mg | INHALATION_SOLUTION | Freq: Four times a day (QID) | RESPIRATORY_TRACT | 12 refills | Status: DC | PRN

## 2023-09-03 MED ORDER — FUROSEMIDE 20 MG PO TABS: 40.0000 mg | ORAL_TABLET | Freq: Every day | ORAL | Status: DC

## 2023-09-03 MED ORDER — DILTIAZEM HCL 30 MG PO TABS: 30.0000 mg | ORAL_TABLET | Freq: Two times a day (BID) | ORAL | 2 refills | Status: DC

## 2023-09-03 NOTE — Consult Note (Signed)
[facility] Liaison Consult Note    09/03/2023  Paula Ward Feb 24, 1936 [id number]  Insurance: [employer]   Primary Care Provider: [name], MD is noted with [place] [facility], this provider is listed for the transition of care follow up appointments  and [facility] calls   [facility] Liaison screened the patient remotely at [facility].  Call attempts to reach patient to follow up for [facility] team calls and PCP appointment was not successful.     The patient was screened for 30 day readmission hospitalization with noted extreme high risk score for unplanned readmission risk with 3 hospital admissions in 6 months.  The patient was assessed for potential Community Care Coordination service needs for post hospital transition for care coordination. Review of patient's electronic medical record reveals patient is admitted ith Acute on Chronic HF.   Plan: [facility] Liaison will continue to follow progress and disposition to asess for post hospital community care coordination/management needs.  Referral request for community care coordination: unable to reach patient by phone today but anticipate follow up from [facility] team.   [facility], Population [facility] does not replace or interfere with any arrangements made by the Inpatient Transition of Care team.   For questions contact:   Charlesetta Shanks, RN, BSN, CCM [place]  [facility], [facility] Liaison Direct Dial: [phone] or secure chat Email: [email or web address]

## 2023-09-03 NOTE — Discharge Summary (Addendum)
Physician Discharge Summary  MARLINA KALANI ZOX:[id number] DOB: [birth year] DOA: 08/29/2023  PCP: [name], MD  Admit date: 08/29/2023 Discharge date: 09/03/2023  Admitted From: Home  Discharge disposition: Home with home health   Recommendations for Outpatient Follow-Up:   Follow up with your primary care provider in one week.  Check CBC, BMP, magnesium in the next visit Follow-up with pulmonary and cardiology as has been scheduled by the clinic.   Discharge Diagnosis:   Principal Problem:   Acute on chronic diastolic (congestive) heart failure (HCC) Active Problems:   Chronic respiratory failure with hypoxia and hypercapnia (HCC)   Other secondary pulmonary hypertension (HCC)   Chronic kidney disease, stage 3b (HCC)   Macrocytic anemia   Essential hypertension   Hyponatremia   Acute heart failure with preserved ejection fraction (HFpEF) (HCC)   Dependence on supplemental oxygen   Elevated troponin I level   Nonrheumatic aortic valve stenosis   Scleroderma (HCC)   Chronic pericardial effusion   Pulmonary arterial hypertension (HCC)   Discharge Condition: Improved.  Diet recommendation: Low sodium, heart healthy.    Wound care: None.  Code status: Full.   History of Present Illness:   Paula MCCLAMMY is a 87 y.o. female with medical history significant for hypertension, CKD 3b, interstitial lung disease, pulmonary hypertension, chronic hypoxic respiratory failure on 3L of O2, chronic anemia, and chronic diastolic CHF presented to hospital with shortness of breath, weight gain, and orthopnea for about 1 week. Due to this, patient called her cardiology office, and was advised to go to the ED. In the ED, patient is found to be afebrile and saturating well on 3 L of O2. EKG demonstrates SR with first-degree AV nodal block.  Chest x-ray is notable for cardiomegaly, mild CHF, and pleural effusions.  Labs are most notable for sodium 130, creatinine 1.33,  negative respiratory virus panel, and hemoglobin 8.5. Pt was then admitted for further management.   Hospital Course:   Following conditions were addressed during hospitalization as listed below,  Acute on chronic diastolic CHF  Large pericardial effusion-  not in tamponade BNP at baseline.  2D ECHO from 07/2023: EF was >75% with grade 1DD, severe LAE, large pericardial effusion, and mild AS.  Patient initially received IV Lasix.  Has been switched to on oral Lasix.Cardiology was consulted and plan was to continue aldactone, was started jardiance and cardizem.  Plan for ARBs as an outpatient.  Patient was advised to follow-up with cardiology as outpatient.   Chronic hypoxic respiratory failure- on 3L of O2 at baseline  ILD Pulm HTN Continue oxygen supplementation.  Pulmonary on board. Plan is to stop Opsumit based on increasing frequency of left heart failure episodes and anemia. Recommend pulmonary follow-up as outpatient.   Hyponatremia  Likely in setting of hypervolemia.  Latest sodium of 134   CKD 3B  Creatinine of 1.0.    Likely at baseline    Macrocytic/Anemia of chronic kidney disease Hemoglobin of 8.3.  Recommend monitoring as outpatient.  Disposition.  At this time, patient is stable for disposition home with outpatient PCP, pulmonary and cardiology follow-up  Medical Consultants:   PCCM Cardiology  Procedures:    None Subjective:   Today, patient was seen and examined at bedside.  Patient states that she feels okay.  Denies any nausea vomiting fevers chills or rigor.  Breathing at baseline.  Discharge Exam:   Vitals:   09/03/23 0435 09/03/23 1215  BP: (!) 125/51   Pulse: 80  Resp: 18   Temp: 98.1 F (36.7 C)   SpO2: 99% 95%   Vitals:   09/02/23 2034 09/02/23 2143 09/03/23 0435 09/03/23 1215  BP: (!) 125/51 (!) 115/44 (!) 125/51   Pulse: 90 79 80   Resp: 18  18   Temp: 98.6 F (37 C)  98.1 F (36.7 C)   TempSrc: Oral  Oral   SpO2: 95%  99% 95%   Weight:   44.5 kg   Height:       Body mass index is 17.38 kg/m.  General: Alert awake, not in obvious distress, thinly built HENT: pupils equally reacting to light,  No scleral pallor or icterus noted. Oral mucosa is moist.  Chest:  Diminished breath sounds bilaterally.  Coarse breath sounds noted. CVS: S1 &S2 heard. No murmur.  Regular rate and rhythm. Abdomen: Soft, nontender, nondistended.  Bowel sounds are heard.   Extremities: No cyanosis, clubbing or edema.  Peripheral pulses are palpable. Psych: Alert, awake and Communicative. CNS:  No cranial nerve deficits.  Moves all extremities Skin: Warm and dry.  No rashes noted.  The results of significant diagnostics from this hospitalization (including imaging, microbiology, ancillary and laboratory) are listed below for reference.     Diagnostic Studies:   DG Chest 2 View  Result Date: 08/29/2023 CLINICAL DATA:  Shortness of breath and weight gain. On home oxygen. EXAM: CHEST - 2 VIEW COMPARISON:  08/13/2023 FINDINGS: Midline trachea. Moderate to marked cardiomegaly again with a configuration which can be seen with pericardial effusion. Small left and trace right pleural effusions, chronic. No pneumothorax. Interstitial edema is mild, improved. Improved bibasilar airspace disease. Surgical clips project over the left axilla. IVC filter. IMPRESSION: Cardiomegaly with improved mild congestive heart failure. Small, left-greater-than-right pleural effusions are similar. Adjacent presumed atelectasis at the lung bases is improved. Electronically Signed   By: Jeronimo Greaves M.D.   On: 08/29/2023 17:59     Labs:   Basic Metabolic Panel: Recent Labs  Lab 08/29/23 1613 08/30/23 0410 08/31/23 0422 09/01/23 0405 09/02/23 0409 09/03/23 0420  NA 130* 131* 131* 134* 134* 134*  K 4.7 3.9 4.1 3.8 3.8 4.1  CL 91* 91* 88* 91* 91* 92*  CO2 31 31 34* 33* 35* 32  GLUCOSE 95 99 95 90 95 101*  BUN 49* 50* 61* 49* 56* 55*  CREATININE 1.33* 1.06*  1.23* 1.02* 1.16* 1.05*  CALCIUM 8.4* 8.7* 8.5* 8.6* 8.6* 8.7*  MG 2.3  --   --   --   --  2.4   GFR Estimated Creatinine Clearance: 27.5 mL/min (A) (by C-G formula based on SCr of 1.05 mg/dL (H)). Liver Function Tests: Recent Labs  Lab 08/29/23 1613  AST 27  ALT 13  ALKPHOS 54  BILITOT 1.0  PROT 6.8  ALBUMIN 3.9   No results for input(s): "LIPASE", "AMYLASE" in the last 168 hours. No results for input(s): "AMMONIA" in the last 168 hours. Coagulation profile No results for input(s): "INR", "PROTIME" in the last 168 hours.  CBC: Recent Labs  Lab 08/29/23 1613 08/30/23 0410 08/31/23 0422 09/01/23 0405 09/02/23 0409 09/03/23 0420  WBC 6.7 5.7 8.5 7.2 6.0 5.4  NEUTROABS 5.0  --   --   --   --   --   HGB 8.5* 8.3* 8.8* 8.5* 8.6* 8.3*  HCT 26.5* 26.7* 27.4* 27.0* 26.6* 27.0*  MCV 111.3* 112.7* 110.5* 110.7* 111.3* 112.5*  PLT 157 152 159 155 160 159   Cardiac Enzymes: No results for  input(s): "CKTOTAL", "CKMB", "CKMBINDEX", "TROPONINI" in the last 168 hours. BNP: Invalid input(s): "POCBNP" CBG: No results for input(s): "GLUCAP" in the last 168 hours. D-Dimer No results for input(s): "DDIMER" in the last 72 hours. Hgb A1c No results for input(s): "HGBA1C" in the last 72 hours. Lipid Profile No results for input(s): "CHOL", "HDL", "LDLCALC", "TRIG", "CHOLHDL", "LDLDIRECT" in the last 72 hours. Thyroid function studies No results for input(s): "TSH", "T4TOTAL", "T3FREE", "THYROIDAB" in the last 72 hours.  Invalid input(s): "FREET3" Anemia work up No results for input(s): "VITAMINB12", "FOLATE", "FERRITIN", "TIBC", "IRON", "RETICCTPCT" in the last 72 hours. Microbiology Recent Results (from the past 240 hour(s))  Resp panel by RT-PCR (RSV, Flu A&B, Covid) Anterior Nasal Swab     Status: None   Collection Time: 08/29/23  4:25 PM   Specimen: Anterior Nasal Swab  Result Value Ref Range Status   SARS Coronavirus 2 by RT PCR NEGATIVE NEGATIVE Final    Comment:  (NOTE) SARS-CoV-2 target nucleic acids are NOT DETECTED.  The SARS-CoV-2 RNA is generally detectable in upper respiratory specimens during the acute phase of infection. The lowest concentration of SARS-CoV-2 viral copies this assay can detect is 138 copies/mL. A negative result does not preclude SARS-Cov-2 infection and should not be used as the sole basis for treatment or other patient management decisions. A negative result may occur with  improper specimen collection/handling, submission of specimen other than nasopharyngeal swab, presence of viral mutation(s) within the areas targeted by this assay, and inadequate number of viral copies(<138 copies/mL). A negative result must be combined with clinical observations, patient history, and epidemiological information. The expected result is Negative.  Fact Sheet for Patients:  BloggerCourse.com  Fact Sheet for Healthcare Providers:  SeriousBroker.it  This test is no t yet approved or cleared by the [place] FDA and  has been authorized for detection and/or diagnosis of SARS-CoV-2 by FDA under an Emergency Use Authorization (EUA). This EUA will remain  in effect (meaning this test can be used) for the duration of the COVID-19 declaration under Section 564(b)(1) of the Act, 21 U.S.C.section 360bbb-3(b)(1), unless the authorization is terminated  or revoked sooner.       Influenza A by PCR NEGATIVE NEGATIVE Final   Influenza B by PCR NEGATIVE NEGATIVE Final    Comment: (NOTE) The Xpert Xpress SARS-CoV-2/FLU/RSV plus assay is intended as an aid in the diagnosis of influenza from Nasopharyngeal swab specimens and should not be used as a sole basis for treatment. Nasal washings and aspirates are unacceptable for Xpert Xpress SARS-CoV-2/FLU/RSV testing.  Fact Sheet for Patients: BloggerCourse.com  Fact Sheet for Healthcare  Providers: SeriousBroker.it  This test is not yet approved or cleared by the [place] FDA and has been authorized for detection and/or diagnosis of SARS-CoV-2 by FDA under an Emergency Use Authorization (EUA). This EUA will remain in effect (meaning this test can be used) for the duration of the COVID-19 declaration under Section 564(b)(1) of the Act, 21 U.S.C. section 360bbb-3(b)(1), unless the authorization is terminated or revoked.     Resp Syncytial Virus by PCR NEGATIVE NEGATIVE Final    Comment: (NOTE) Fact Sheet for Patients: BloggerCourse.com  Fact Sheet for Healthcare Providers: SeriousBroker.it  This test is not yet approved or cleared by the [place] FDA and has been authorized for detection and/or diagnosis of SARS-CoV-2 by FDA under an Emergency Use Authorization (EUA). This EUA will remain in effect (meaning this test can be used) for the duration of the COVID-19 declaration  under Section 564(b)(1) of the Act, 21 U.S.C. section 360bbb-3(b)(1), unless the authorization is terminated or revoked.  Performed at [facility], [address], [place], [place] [address]      Discharge Instructions:   Discharge Instructions     (HEART FAILURE PATIENTS) Call MD:  Anytime you have any of the following symptoms: 1) 3 pound weight gain in 24 hours or 5 pounds in 1 week 2) shortness of breath, with or without a dry hacking cough 3) swelling in the hands, feet or stomach 4) if you have to sleep on extra pillows at night in order to breathe.   Complete by: As directed    Avoid straining   Complete by: As directed    Diet - low sodium heart healthy   Complete by: As directed    Discharge instructions   Complete by: As directed    Follow-up with your cardiologist as scheduled.  Follow-up with primary care provider in 1 week.  Seek medical attention for worsening symptoms.    For home use only DME Nebulizer machine   Complete by: As directed    Patient needs a nebulizer to treat with the following condition: Interstitial lung disease (HCC)   Length of Need: Lifetime   Heart Failure patients record your daily weight using the same scale at the same time of day   Complete by: As directed    Increase activity slowly   Complete by: As directed    STOP any activity that causes chest pain, shortness of breath, dizziness, sweating, or exessive weakness   Complete by: As directed       Allergies as of 09/03/2023       Reactions   Codeine Nausea Only   Hallucinations, too   Other Nausea And Vomiting, Other (See Comments)   "-mycin" antibiotics.   Also cause hallucinations.   Erythromycin Nausea And Vomiting   Iodinated Contrast Media Other (See Comments)   Renal issues   Lisinopril    Pt doesn't remember reaction   Mirtazapine Other (See Comments)   "Threw me into orbit"   Mycophenolate Mofetil Nausea Only   Sulfa Antibiotics Other (See Comments)   Unknown reaction        Medication List     STOP taking these medications    mirtazapine 15 MG tablet Commonly known as: REMERON   Opsumit 10 MG tablet Generic drug: macitentan       TAKE these medications    acetaminophen 325 MG tablet Commonly known as: Tylenol Take 2 tablets (650 mg total) by mouth every 6 (six) hours as needed for moderate pain.   albuterol 108 (90 Base) MCG/ACT inhaler Commonly known as: VENTOLIN HFA INHALE 2 PUFFS INTO THE LUNGS EVERY 6 HOURS AS NEEDED FOR WHEEZING OR SHORTNESS OF BREATH What changed:  how much to take how to take this when to take this reasons to take this additional instructions   albuterol (2.5 MG/3ML) 0.083% nebulizer solution Commonly known as: PROVENTIL Take 3 mLs (2.5 mg total) by nebulization every 6 (six) hours as needed for wheezing or shortness of breath. What changed: You were already taking a medication with the same name, and this  prescription was added. Make sure you understand how and when to take each.   augmented betamethasone dipropionate 0.05 % cream Commonly known as: DIPROLENE-AF Apply 1 application  topically 2 (two) times daily as needed (for irritation).   clobetasol ointment 0.05 % Commonly known as: TEMOVATE Apply 1 Application  topically 2 (two) times daily as needed (for irritation).   diltiazem 30 MG tablet Commonly known as: CARDIZEM Take 1 tablet (30 mg total) by mouth every 12 (twelve) hours.   docusate sodium 100 MG capsule Commonly known as: COLACE Take 1 capsule (100 mg total) by mouth 2 (two) times daily. What changed: when to take this   empagliflozin 10 MG Tabs tablet Commonly known as: JARDIANCE Take 1 tablet (10 mg total) by mouth daily.   Ensure High Protein Liqd Take 0.5 Bottles by mouth daily as needed (for supplementation).   famotidine 20 MG tablet Commonly known as: PEPCID Take 1 tablet (20 mg total) by mouth at bedtime.   fluticasone 50 MCG/ACT nasal spray Commonly known as: FLONASE USE 1 SPRAY(S) IN EACH NOSTRIL ONCE DAILY AS NEEDED FOR ALLERGIES What changed: See the new instructions.   furosemide 20 MG tablet Commonly known as: LASIX Take 2 tablets (40 mg total) by mouth daily. What changed: how much to take   guaiFENesin 600 MG 12 hr tablet Commonly known as: MUCINEX Take 600 mg by mouth 2 (two) times daily as needed.   ipratropium 0.03 % nasal spray Commonly known as: ATROVENT Place 2 sprays into both nostrils every 12 (twelve) hours. What changed: when to take this   latanoprost 0.005 % ophthalmic solution Commonly known as: XALATAN Place 1 drop into both eyes at bedtime.   magic mouthwash Soln Take 10 mLs by mouth 4 (four) times daily as needed for mouth pain.   montelukast 10 MG tablet Commonly known as: SINGULAIR TAKE 1 TABLET BY MOUTH AT BEDTIME What changed: additional instructions   mupirocin ointment 2 % Commonly known as:  BACTROBAN Apply 1 Application topically 2 (two) times daily as needed (for irritation).   OXYGEN Inhale 2-3 L/min into the lungs continuous.   pantoprazole 40 MG tablet Commonly known as: Protonix Take 1 tablet (40 mg total) by mouth daily. What changed:  when to take this reasons to take this   polyethylene glycol 17 g packet Commonly known as: MIRALAX / GLYCOLAX Take 17 g by mouth daily.   spironolactone 25 MG tablet Commonly known as: ALDACTONE Take 0.5 tablets (12.5 mg total) by mouth daily.   sucralfate 1 GM/10ML suspension Commonly known as: CARAFATE TAKE 10 MLS BY MOUTH EVERY 6 HOURS AS NEEDED FOR REFLUX What changed: See the new instructions.   triamcinolone cream 0.1 % Commonly known as: KENALOG Apply 1 Application topically See admin instructions. Apply to affected leg(s) after showers and up to 2 additional times a day as needed for irritation               Durable Medical Equipment  (From admission, onward)           Start     Ordered   09/03/23 0000  For home use only DME Nebulizer machine       Question Answer Comment  Patient needs a nebulizer to treat with the following condition Interstitial lung disease (HCC)   Length of Need Lifetime      09/03/23 1436            Follow-up Information     [name], MD Follow up in 1 week(s).   Specialty: Internal Medicine Contact information: [address] [place] [address] [phone]         Ronney Asters, NP Follow up.   Specialty: Cardiology Why: [facility] location - per our discussion, we moved  your follow-up appointment from 09/03/23 to Wednesday 09/09/23 at 1:55 PM with Edd Fabian, NP. Arrive 15 minutes prior to appointment to check in. Contact information: [address] [place] [address] [phone]         Hunsucker, Lesia Sago, MD Follow up.   Specialty: Pulmonary Disease Why: follow up as per clinic Contact  information: [address] [place] [address] [phone]         [facility] Follow up.   Specialty: Home Health Services Why: Home Health will follow up with you 24 to 48hrs after discharge. Contact information: [address] [place] [address] [phone]                  Time coordinating discharge: 39 minutes  Signed:  Kaushal Vannice  [facility] 09/03/2023, 2:37 PM

## 2023-09-03 NOTE — Progress Notes (Signed)
Occupational Therapy Treatment Patient Details Name: Paula Ward MRN: [id number] DOB: 21-Feb-1936 Today's Date: 09/03/2023   History of present illness 87 yo female presents to therapy following hospital admission on 08/29/2023 due to progressive SOB and 3# weight gain overnight. Pt recently admitted to hospital 10/24-10/26 for CHF. Pt is on 3 L/min supplemental O2 baseline. CXR noted for cardiomegaly, mild CHF and pleural effusions. Pt admitted for CHF. Pt PMH includes but is not limited to: anemia, angiodysplasia of stomach and duodenum, arthus phenomenon, L ba ca, PE, pulmonary HTN, renal cell carcinoma, and  L IM nail (2023).   OT comments  Patient progressing and showed improved overall mobility and ADLs performance without any need of external assistance and no LOB using her only her SPC, compared to previous session. OT goals have been met with this session supervising pt's ADLs while standing or sitting at sink in order to integrate energy conservation principles. Patient remains limited by generalized weakness and decreased activity tolerance along with deficits noted below, but is demonstrating skills adequate for a return home. OT goals have been met and pt agreeable for acute OT to sign off.        If plan is discharge home, recommend the following:  Assistance with cooking/housework;Assist for transportation;A little help with bathing/dressing/bathroom   Equipment Recommendations  None recommended by OT    Recommendations for Other Services      Precautions / Restrictions Precautions Precautions: Fall Restrictions Weight Bearing Restrictions: No       Mobility Bed Mobility               General bed mobility comments: in recliner    Transfers                         Balance Overall balance assessment: Mild deficits observed, not formally tested                                         ADL either performed or assessed with  clinical judgement   ADL Overall ADL's : At baseline;Modified independent Eating/Feeding: Independent   Grooming: Wash/dry face;Wash/dry hands;Standing;Modified independent   Upper Body Bathing: Sitting;Standing;Modified independent Upper Body Bathing Details (indicate cue type and reason): Cues only to integrate energy conservation principles. Lower Body Bathing: Modified independent;Sit to/from stand;Sitting/lateral leans   Upper Body Dressing : Modified independent   Lower Body Dressing: Modified independent Lower Body Dressing Details (indicate cue type and reason): Cues only to integrate energy conservation principles including donning underwear and pants all at once to avoid sit to stand and sit to stand. Toilet Transfer: Modified Independent;Ambulation Toilet Transfer Details (indicate cue type and reason): Pt used her own SPC. Slow but steady Toileting- Clothing Manipulation and Hygiene: Modified independent       Functional mobility during ADLs: Modified independent      Extremity/Trunk Assessment Upper Extremity Assessment Upper Extremity Assessment: Overall WFL for tasks assessed   Lower Extremity Assessment Lower Extremity Assessment: Overall WFL for tasks assessed        Vision       Perception     Praxis      Cognition Arousal: Alert Behavior During Therapy: WFL for tasks assessed/performed Overall Cognitive Status: Within Functional Limits for tasks assessed  Exercises Other Exercises Other Exercises: Handout and education on all energy conservation principles.    Shoulder Instructions       General Comments      Pertinent Vitals/ Pain       Pain Assessment Pain Assessment: No/denies pain  Home Living                                          Prior Functioning/Environment              Frequency  Min 1X/week        Progress Toward Goals  OT  Goals(current goals can now be found in the care plan section)  Progress towards OT goals: Goals met/education completed, patient discharged from OT  Acute Rehab OT Goals Patient Stated Goal: Home today. Would like to bathe prior to dressing for home. OT Goal Formulation: All assessment and education complete, DC therapy Potential to Achieve Goals: Good  Plan      Co-evaluation                 AM-PAC OT "6 Clicks" Daily Activity     Outcome Measure   Help from another person eating meals?: None Help from another person taking care of personal grooming?: None Help from another person toileting, which includes using toliet, bedpan, or urinal?: None Help from another person bathing (including washing, rinsing, drying)?: None Help from another person to put on and taking off regular upper body clothing?: None Help from another person to put on and taking off regular lower body clothing?: None 6 Click Score: 24    End of Session Equipment Utilized During Treatment: Oxygen (SPC)  OT Visit Diagnosis: Muscle weakness (generalized) (M62.81)   Activity Tolerance Patient tolerated treatment well   Patient Left in chair;with family/visitor present   Nurse Communication Mobility status        Time: 1005-1047 OT Time Calculation (min): 42 min  Charges: OT General Charges $OT Visit: 1 Visit OT Treatments $Self Care/Home Management : 23-37 mins $Therapeutic Activity: 8-22 mins  Victorino Dike, OT Acute Rehab Services Office: [phone] 09/03/2023   Theodoro Clock 09/03/2023, 12:20 PM

## 2023-09-03 NOTE — Care Management Important Message (Signed)
Important Message  Patient Details  Name: Paula Ward MRN: [id number] Date of Birth: [birth year]   Important Message Given:  Yes - Medicare IM     Corey Harold 09/03/2023, 12:44 PM

## 2023-09-03 NOTE — Plan of Care (Signed)

## 2023-09-03 NOTE — Plan of Care (Signed)
  Problem: Education: Goal: Ability to demonstrate management of disease process will improve Outcome: Progressing   Problem: Education: Goal: Knowledge of General Education information will improve Description: Including pain rating scale, medication(s)/side effects and non-pharmacologic comfort measures Outcome: Progressing   Problem: Health Behavior/Discharge Planning: Goal: Ability to manage health-related needs will improve Outcome: Progressing   Problem: Activity: Goal: Risk for activity intolerance will decrease Outcome: Progressing   Problem: Pain Management: Goal: General experience of comfort will improve Outcome: Progressing   Problem: Safety: Goal: Ability to remain free from injury will improve Outcome: Progressing   Problem: Skin Integrity: Goal: Risk for impaired skin integrity will decrease Outcome: Progressing

## 2023-09-04 ENCOUNTER — Other Ambulatory Visit: Payer: Medicare PPO

## 2023-09-04 ENCOUNTER — Telehealth: Payer: Self-pay | Admitting: *Deleted

## 2023-09-04 NOTE — Transitions of Care (Post Inpatient/ED Visit) (Signed)
09/04/2023  Name: Paula Ward MRN: [id number] DOB: 1936-01-28  Today's TOC FU Call Status: Today's TOC FU Call Status:: Successful TOC FU Call Completed TOC FU Call Complete Date: 09/04/23 Patient's Name and Date of Birth confirmed.  Transition Care Management Follow-up Telephone Call Date of Discharge: 09/03/23 Discharge Facility: [facility]) Type of Discharge: Inpatient Admission Primary Inpatient Discharge Diagnosis:: Acute on chronic CHF/ chronic respiratory failure- on home O2 at baseline How have you been since you were released from the hospital?: Better ("I am much better this time than I was when they sent me home at the end of October-- they released me from the hospital too soon which is why I had to go back.  I am fine.  My private duty caregiver is here 3 times a week.  No concerns this time") Any questions or concerns?: No  Items Reviewed: Did you receive and understand the discharge instructions provided?: Yes (thoroughly reviewed with patient who verbalizes good understanding of same) Medications obtained,verified, and reconciled?: Yes (Medications Reviewed) (Full medication reconciliation/ review completed; no concerns or discrepancies identified; confirmed patient obtained/ is taking all newly Rx'd medications as instructed; self-manages medications and denies questions/ concerns around medications today) Any new allergies since your discharge?: No Dietary orders reviewed?: Yes Type of Diet Ordered:: Heart Healthy, low salt Do you have support at home?: Yes People in Home: spouse Name of Support/Comfort Primary Source: Reports essentially independent in self-care activities at baseline; supportive spouse assists as/ if needed/ indicated- also reports today she has private duty caregiver from [employer]" that assists on M-W- F for 12 hours each week  Medications Reviewed Today: Medications Reviewed Today     Reviewed by Michaela Corner, RN (Registered  Nurse) on 09/04/23 at 1622  Med List Status: <None>   Medication Order Taking? Sig Documenting Provider Last Dose Status Informant  acetaminophen (TYLENOL) 325 MG tablet [id number] Yes Take 2 tablets (650 mg total) by mouth every 6 (six) hours as needed for moderate pain. Wallis Bamberg, PA-C Taking Active Self           Med Note Bonna Gains I   EPP Aug 29, 2023 10:03 PM)    albuterol (PROVENTIL) (2.5 MG/3ML) 0.083% nebulizer solution [id number] Yes Take 3 mLs (2.5 mg total) by nebulization every 6 (six) hours as needed for wheezing or shortness of breath. Pokhrel, Laxman, MD Taking Active   albuterol (VENTOLIN HFA) 108 (90 Base) MCG/ACT inhaler [id number] Yes INHALE 2 PUFFS INTO THE LUNGS EVERY 6 HOURS AS NEEDED FOR WHEEZING OR SHORTNESS OF BREATH  Patient taking differently: Inhale 2 puffs into the lungs every 6 (six) hours as needed for wheezing or shortness of breath.   [name], MD Taking Active Self  augmented betamethasone dipropionate (DIPROLENE-AF) 0.05 % cream [id number] Yes Apply 1 application  topically 2 (two) times daily as needed (for irritation). [provider] Taking Active Self           Med Note Bonna Gains I   Sat Aug 29, 2023  9:58 PM)    clobetasol ointment (TEMOVATE) 0.05 % [id number] Yes Apply 1 Application topically 2 (two) times daily as needed (for irritation). [provider] Taking Active Self           Med Note Bonna Gains I   Sat Aug 29, 2023  9:59 PM)    diltiazem (CARDIZEM) 30 MG tablet [id number] Yes Take 1 tablet (30 mg total) by mouth every 12 (twelve) hours. Pokhrel,  Rebekah Chesterfield, MD Taking Active   docusate sodium (COLACE) 100 MG capsule [id number] Yes Take 1 capsule (100 mg total) by mouth 2 (two) times daily.  Patient taking differently: Take 100 mg by mouth in the morning.   Edmisten, Lyn Hollingshead, PA Taking Active Self  empagliflozin (JARDIANCE) 10 MG TABS tablet [id number] Yes Take 1 tablet (10 mg total) by mouth  daily. Lewie Chamber, MD Taking Active Self  famotidine (PEPCID) 20 MG tablet [id number] Yes Take 1 tablet (20 mg total) by mouth at bedtime. Napoleon Form, MD Taking Active Self           Med Note Antony Madura, Harlin Rain Aug 13, 2023 10:13 PM)    fluticasone (FLONASE) 50 MCG/ACT nasal spray [id number] Yes USE 1 SPRAY(S) IN EACH NOSTRIL ONCE DAILY AS NEEDED FOR ALLERGIES  Patient taking differently: Place 1 spray into both nostrils in the morning.   [name], MD Taking Active Self  furosemide (LASIX) 20 MG tablet [id number] Yes Take 2 tablets (40 mg total) by mouth daily. Pokhrel, Laxman, MD Taking Active   guaiFENesin (MUCINEX) 600 MG 12 hr tablet [id number] Yes Take 600 mg by mouth 2 (two) times daily as needed. [provider] Taking Active Self           Med Note Michaela Corner   Fri Sep 04, 2023  3:42 PM) 09/04/23: Reports during [facility] call she takes one half pill QD  ipratropium (ATROVENT) 0.03 % nasal spray [id number] Yes Place 2 sprays into both nostrils every 12 (twelve) hours.  Patient taking differently: Place 2 sprays into both nostrils at bedtime.   Martina Sinner, MD Taking Active Self  latanoprost (XALATAN) 0.005 % ophthalmic solution [id number] Yes Place 1 drop into both eyes at bedtime. [provider] Taking Active Self  magic mouthwash SOLN [id number] Yes Take 10 mLs by mouth 4 (four) times daily as needed for mouth pain. Burnadette Pop, MD Taking Active Self           Med Note Cyndie Chime, [facility]   Sat Aug 29, 2023 10:00 PM)    montelukast (SINGULAIR) 10 MG tablet [id number] Yes TAKE 1 TABLET BY MOUTH AT BEDTIME  Patient taking differently: Take 10 mg by mouth at bedtime. TAKE 1 TABLET BY MOUTH AT BEDTIME   [name], MD Taking Active Self  mupirocin ointment (BACTROBAN) 2 % [id number] Yes Apply 1 Application topically 2 (two) times daily as needed (for irritation). [provider] Taking Active Self           Med Note  Michaela Corner   Fri Sep 04, 2023  3:44 PM) 09/04/23: Reports during [facility] call has and is taking only if needed   Nutritional Supplements (ENSURE HIGH PROTEIN) LIQD [id number] Yes Take 0.5 Bottles by mouth daily as needed (for supplementation). [provider] Taking Active Self  OXYGEN [id number] Yes Inhale 2-3 L/min into the lungs continuous. [provider] Taking Active Self  pantoprazole (PROTONIX) 40 MG tablet [id number] Yes Take 1 tablet (40 mg total) by mouth daily.  Patient taking differently: Take 40 mg by mouth daily as needed (for acid reflux).   Napoleon Form, MD Taking Active Self  polyethylene glycol (MIRALAX / GLYCOLAX) 17 g packet [id number] Yes Take 17 g by mouth daily. Derenda Fennel, [place] Taking Active Self           Med Note Michaela Corner   Fri Sep 04, 2023  3:46  PM) 09/04/23: Reports during TOC call has not needed recently- takes prn   spironolactone (ALDACTONE) 25 MG tablet [id number] Yes Take 0.5 tablets (12.5 mg total) by mouth daily. Lewie Chamber, MD Taking Active Self  sucralfate (CARAFATE) 1 GM/10ML suspension [id number] Yes TAKE 10 MLS BY MOUTH EVERY 6 HOURS AS NEEDED FOR REFLUX  Patient taking differently: Take 1 g by mouth every 6 (six) hours as needed (to coat the stomach).   Napoleon Form, MD Taking Active Self           Med Note Bonna Gains I   GUY Aug 29, 2023 10:02 PM)    triamcinolone cream (KENALOG) 0.1 % [id number] Yes Apply 1 Application topically See admin instructions. Apply to affected leg(s) after showers and up to 2 additional times a day as needed for irritation [provider] Taking Active Self           Med Note Bonna Gains I   Sat Aug 29, 2023 10:03 PM)              Home Care and Equipment/Supplies: Were Home Health Services Ordered?: Yes Name of Home Health Agency:: [facility]  PT/ OT  [phone] Has Agency set up a time to come to your home?: No (Care Coordination  Outreach to [facility]: spoke with Mick Sell- reports that [name], Development worker, international aid is in proces of reaching out to PCP for clarification around orders for home health; spoke with Alycia Rossetti-- see narrative/ care plan) EMR reviewed for Home Health Orders: Orders present/patient has not received call (refer to CM for follow-up) (Successfully enrolled into 30-day TOC program) Any new equipment or medical supplies ordered?: No  Functional Questionnaire: Do you need assistance with bathing/showering or dressing?: No (reports she is independent at baseline: private duty caregiver also assists as needed/ indicated) Do you need assistance with meal preparation?: Yes (husband assists as needed; private duty caregiver assists as needed) Do you need assistance with eating?: No Do you have difficulty maintaining continence: No Do you need assistance with getting out of bed/getting out of a chair/moving?: No (uses cane at baseline) Do you have difficulty managing or taking your medications?: No  Follow up appointments reviewed: PCP Follow-up appointment confirmed?: Yes (care coordination outreach in real-time with scheduling care guide to successfully schedule hospital follow up PCP appointment 09/14/23: patient declined sooner appointments which were offered to her) Date of PCP follow-up appointment?: 09/14/23 Follow-up Provider: PCP Specialist Hospital Follow-up appointment confirmed?: Yes Date of Specialist follow-up appointment?: 09/09/23 Follow-Up Specialty Provider:: cardiology provider; also has HFU office visit with pulmonary provider on 09/16/23 Do you need transportation to your follow-up appointment?: No Do you understand care options if your condition(s) worsen?: Yes-patient verbalized understanding  SDOH Interventions Today    Flowsheet Row Most Recent Value  SDOH Interventions   Food Insecurity Interventions Intervention Not Indicated  Housing Interventions Intervention Not Indicated   Transportation Interventions Intervention Not Indicated  [reports has private duty transportation services through "[employer]"]  Utilities Interventions Intervention Not Indicated       Goals Addressed             This Visit's Progress    TOC 30-day Program Care Plan   On track    Current Barriers:  Home Health services [facility]: PT/OT- verified ordered with review of IP TOC note; contacted Alycia RossettiFirefighter at [phone]: he reports "problem" with way inpatient order was received: "they did not put 'evaluate and treat on order;" lengthy care coordination outreach with  multiple personnel at [facility] prior to speaking with director Alycia Rossetti- Alycia Rossetti reports he will push order through as is- and we discussed possibility of adding of nursing if PCP is agreeable- Alycia Rossetti will follow up to facilitate initiating services Equipment/DME Patient reports she is using her husband's nebulizer- states "they ordered the medicine for me at the hospital, but not a machine; they said it is okay to use my husband's nebulizer- she states that she is "not sure" if they ordered a separate mouth piece for her to use-- she has been using the same mouth piece as her husband: she was advised to stop using same-- will make pulmonary provider aware in an effort to facilitate getting patient her own mouth piece vs. own nebulizer; there is nothing related to this that I could find in review of EHR discharge instructions- I was only able to verify that new nebulizer medication was ordered at time of hospital discharge Fragile state of health with multiple chronic conditions; multiple hospital and ED visits, some within 30-days of one another; question palliative care vs. hospice readiness  RNCM Clinical Goal(s):  Patient will work with the Care Management team over the next 30 days to address Transition of Care Barriers: Home Health services Equipment/DME Possible need for palliative care vs. Hospice referral take all  medications exactly as prescribed and will call provider for medication related questions as evidenced by patient reporting of same during [facility] 30-day program outreaches attend all scheduled medical appointments: 09/09/23- cardiology provider; 09/14/23- PCP; 09/16/23- pulmonary provider as evidenced by review of same with patient during TOC 30-day program outreaches demonstrate Ongoing adherence to prescribed treatment plan for CHF as evidenced by patient reporting during TOC 30-day program outreaches continue to work with RN Care Manager to address care management and care coordination needs related to  CHF as evidenced by adherence to CM Team Scheduled appointments work with home health team once they are established in patient's care to participate in PT and OT services as evidenced by patient reporting and collaboration as indicated with home health team not experience hospital admission as evidenced by review of EMR. Hospital Admissions in last 6 months = 3  through collaboration with RN Care manager, provider, and care team.   Interventions: Evaluation of current treatment plan related to  self management and patient's adherence to plan as established by provider  Transitions of Care:  New goal. 09/04/23 Durable Medical Equipment (DME) needs assessed with patient/caregiver Doctor Visits  - discussed the importance of doctor visits Communication with home health team ordered at time of hospital discharge re: need for initiation of services Contacted provider for patient needs possible need for palliative care referral; need to explore options around getting patient a nebulizer mouth piece/ machine for her individual use-- currently using her husband's nebulizer Arranged PCP follow-up within 7 days (Care Guide Scheduled) Contacted Health RN/OT/PT - spoke with Alycia Rossetti, Interior and spatial designer at [facility] to facilitate initiation of home health services as ordered at time of hospital discharge on  09/03/23 Reinforced rationale for daily weight monitoring at home along with weight gain guidelines/ action plan for weight gain; importance of taking diuretic as prescribed  Reinforced/ provided education around safe use/ maintenance of home O2 Provided education around need for her own nebulizer equipment-- concerns around infection with her using her husband's equipment  Patient Goals/Self-Care Activities: Participate in Transition of Care Program/Attend TOC scheduled calls Take all medications as prescribed Attend all scheduled provider appointments Call provider office for new concerns or questions  Continue pacing activity to avoid episodes of shortness of breath Continue to follow your established action plan for episodes of shortness of breath Continue using home oxygen as prescribed Use assistive devices as needed to prevent falls Listen out for a call from the the home health team [facility]--- [phone]: I have contacted them and confirmed they will be getting in touch with you Please tell your doctors that you are using the same nebulizer and nebulizer mouth piece that your husband is using-- you need your own nebulizer equipment to make sure you are not spreading germs-- I have also made your doctors aware of this  Follow Up Plan:  Telephone follow up appointment with care management team member scheduled for:  Thursday 09/10/23 at 3:15 pm         Caryl Pina, RN, BSN, CCRN Alumnus RN Care Manager  Transitions of Care  [facility]  [place] [phone]: direct office

## 2023-09-04 NOTE — Patient Instructions (Signed)
Visit Information  Thank you for taking time to visit with me today. Please don't hesitate to contact me if I can be of assistance to you before our next scheduled telephone appointment.  Our next appointment is by telephone on Tuesday 09/10/23 at 3:15- 3:30 pm  Patient Goals/Self-Care Activities: Participate in Transition of Care Program/Attend TOC scheduled calls Take all medications as prescribed Attend all scheduled provider appointments Call provider office for new concerns or questions  Continue pacing activity to avoid episodes of shortness of breath Continue to follow your established action plan for episodes of shortness of breath Continue using home oxygen as prescribed Use assistive devices as needed to prevent falls Listen out for a call from the the home health team [facility]--- [phone]: I have contacted them and confirmed they will be getting in touch with you Please tell your doctors that you are using the same nebulizer and nebulizer mouth piece that your husband is using-- you need your own nebulizer equipment to make sure you are not spreading germs-- I have also made your doctors aware of this  Caryl Pina, RN, BSN, CCRN Alumnus RN Care Manager  Transitions of Care  [facility]  [place] [phone]: direct office   Following is a copy of your care plan:   Goals Addressed             This Visit's Progress    TOC 30-day Program Care Plan   On track    Current Barriers:  Home Health services [facility]: PT/OT- verified ordered with review of IP TOC note; contacted Alycia Rossetti- Interior and spatial designer at [phone]: he reports "problem" with way inpatient order was received: "they did not put 'evaluate and treat on order;" lengthy care coordination outreach with multiple personnel at [facility] prior to speaking with director Alycia Rossetti- Alycia Rossetti reports he will push order through as is- and we discussed possibility of adding of nursing if PCP is agreeable-  Alycia Rossetti will follow up to facilitate initiating services Equipment/DME Patient reports she is using her husband's nebulizer- states "they ordered the medicine for me at the hospital, but not a machine; they said it is okay to use my husband's nebulizer- she states that she is "not sure" if they ordered a separate mouth piece for her to use-- she has been using the same mouth piece as her husband: she was advised to stop using same-- will make pulmonary provider aware in an effort to facilitate getting patient her own mouth piece vs. own nebulizer; there is nothing related to this that I could find in review of EHR discharge instructions- I was only able to verify that new nebulizer medication was ordered at time of hospital discharge Fragile state of health with multiple chronic conditions; multiple hospital and ED visits, some within 30-days of one another; question palliative care vs. hospice readiness  RNCM Clinical Goal(s):  Patient will work with the Care Management team over the next 30 days to address Transition of Care Barriers: Home Health services Equipment/DME Possible need for palliative care vs. Hospice referral take all medications exactly as prescribed and will call provider for medication related questions as evidenced by patient reporting of same during [facility] 30-day program outreaches attend all scheduled medical appointments: 09/09/23- cardiology provider; 09/14/23- PCP; 09/16/23- pulmonary provider as evidenced by review of same with patient during TOC 30-day program outreaches demonstrate Ongoing adherence to prescribed treatment plan for CHF as evidenced by patient reporting during [facility] 30-day program outreaches continue to work with RN Care  Manager to address care management and care coordination needs related to  CHF as evidenced by adherence to CM Team Scheduled appointments work with home health team once they are established in patient's care to participate in PT and OT services as  evidenced by patient reporting and collaboration as indicated with home health team not experience hospital admission as evidenced by review of EMR. Hospital Admissions in last 6 months = 3  through collaboration with RN Care manager, provider, and care team.   Interventions: Evaluation of current treatment plan related to  self management and patient's adherence to plan as established by provider  Transitions of Care:  New goal. 09/04/23 Durable Medical Equipment (DME) needs assessed with patient/caregiver Doctor Visits  - discussed the importance of doctor visits Communication with home health team ordered at time of hospital discharge re: need for initiation of services Contacted provider for patient needs possible need for palliative care referral; need to explore options around getting patient a nebulizer mouth piece/ machine for her individual use-- currently using her husband's nebulizer Arranged PCP follow-up within 7 days (Care Guide Scheduled) Contacted Health RN/OT/PT - spoke with Alycia Rossetti, Interior and spatial designer at [employer] to facilitate initiation of home health services as ordered at time of hospital discharge on 09/03/23 Reinforced rationale for daily weight monitoring at home along with weight gain guidelines/ action plan for weight gain; importance of taking diuretic as prescribed  Reinforced/ provided education around safe use/ maintenance of home O2 Provided education around need for her own nebulizer equipment-- concerns around infection with her using her husband's equipment  Patient Goals/Self-Care Activities: Participate in Transition of Care Program/Attend TOC scheduled calls Take all medications as prescribed Attend all scheduled provider appointments Call provider office for new concerns or questions  Continue pacing activity to avoid episodes of shortness of breath Continue to follow your established action plan for episodes of shortness of breath Continue using home oxygen as  prescribed Use assistive devices as needed to prevent falls Listen out for a call from the the home health team [facility]--- [phone]: I have contacted them and confirmed they will be getting in touch with you Please tell your doctors that you are using the same nebulizer and nebulizer mouth piece that your husband is using-- you need your own nebulizer equipment to make sure you are not spreading germs-- I have also made your doctors aware of this  Follow Up Plan:  Telephone follow up appointment with care management team member scheduled for:  Thursday 09/10/23 at 3:15 pm          Patient verbalizes understanding of instructions and care plan provided today and agrees to view in MyChart. Active MyChart status and patient understanding of how to access instructions and care plan via MyChart confirmed with patient.     Telephone follow up appointment with care management team member scheduled for:  Tuesday 09/10/23 at 3:15- 3:30 pm  Please call the care guide team at [phone] if you need to cancel or reschedule your appointment.   If you are experiencing a Mental Health or Behavioral Health Crisis or need someone to talk to please  call the Suicide and Crisis Lifeline: 988 call the [place] National Suicide Prevention Lifeline: 9200685226 or TTY: 636-223-8913 TTY 364-790-6684) to talk to a trained counselor call 1-800-273-TALK (toll free, 24 hour hotline) go to [facility] [address], [place] [phone]) call the [facility] Crisis Line: [phone] call 911

## 2023-09-05 DIAGNOSIS — I509 Heart failure, unspecified: Secondary | ICD-10-CM | POA: Diagnosis not present

## 2023-09-06 NOTE — Progress Notes (Unsigned)
Cardiology Clinic Note   Patient Name: Paula Ward Date of Encounter: 09/09/2023  Primary Care Provider:  [name], MD Primary Cardiologist:  Olga Millers, MD  Patient Profile    Paula Ward 87 year old female presents the clinic today for follow-up evaluation of her lower extremity edema/chronic diastolic CHF.  Past Medical History    Past Medical History:  Diagnosis Date   Anemia    Angiodysplasia of stomach and duodenum    Aortic stenosis    Arthus phenomenon    AVM (arteriovenous malformation)    Blood transfusion without reported diagnosis    Breast cancer (HCC) 1989   Left   Candida esophagitis (HCC)    Cataract    Chronic respiratory failure (HCC)    CKD stage 3b, GFR 30-44 ml/min (HCC)    Corneal edema    Corneal epithelial basement membrane dystrophy    CREST syndrome (HCC)    GERD (gastroesophageal reflux disease)    w/ HH   Interstitial lung disease (HCC)    Nodule of kidney    Pericardial effusion    PONV (postoperative nausea and vomiting)    Pulmonary embolus (HCC) 2003   Pulmonary hypertension (HCC)    Rectal incontinence    Renal cell carcinoma (HCC)    Scleroderma (HCC)    Tubular adenoma of colon    Uterine polyp    Past Surgical History:  Procedure Laterality Date   APPENDECTOMY  1962   BREAST LUMPECTOMY  1989   left   CATARACT EXTRACTION, BILATERAL Bilateral 12/2013   COLONOSCOPY WITH PROPOFOL N/A 04/15/2021   Procedure: COLONOSCOPY WITH PROPOFOL;  Surgeon: Rachael Fee, MD;  Location: [facility];  Service: Endoscopy;  Laterality: N/A;   ENTEROSCOPY N/A 01/18/2016   Procedure: ENTEROSCOPY;  Surgeon: Napoleon Form, MD;  Location: [facility];  Service: Endoscopy;  Laterality: N/A;   ENTEROSCOPY N/A 05/24/2018   Procedure: ENTEROSCOPY;  Surgeon: Lynann Bologna, MD;  Location: [facility];  Service: Endoscopy;  Laterality: N/A;   ENTEROSCOPY N/A 03/29/2021   Procedure: ENTEROSCOPY;  Surgeon: Beverley Fiedler,  MD;  Location: [facility];  Service: Gastroenterology;  Laterality: N/A;   ENTEROSCOPY N/A 04/13/2021   Procedure: ENTEROSCOPY;  Surgeon: Benancio Deeds, MD;  Location: [facility];  Service: Gastroenterology;  Laterality: N/A;   ESOPHAGOGASTRODUODENOSCOPY (EGD) WITH PROPOFOL N/A 12/21/2015   Procedure: ESOPHAGOGASTRODUODENOSCOPY (EGD) WITH PROPOFOL;  Surgeon: Hilarie Fredrickson, MD;  Location: [facility];  Service: Endoscopy;  Laterality: N/A;   HOT HEMOSTASIS N/A 05/24/2018   Procedure: HOT HEMOSTASIS (ARGON PLASMA COAGULATION/BICAP);  Surgeon: Lynann Bologna, MD;  Location: [facility];  Service: Endoscopy;  Laterality: N/A;   HOT HEMOSTASIS N/A 03/29/2021   Procedure: HOT HEMOSTASIS (ARGON PLASMA COAGULATION/BICAP);  Surgeon: Beverley Fiedler, MD;  Location: [facility];  Service: Gastroenterology;  Laterality: N/A;   HOT HEMOSTASIS N/A 04/13/2021   Procedure: HOT HEMOSTASIS (ARGON PLASMA COAGULATION/BICAP);  Surgeon: Benancio Deeds, MD;  Location: [facility];  Service: Gastroenterology;  Laterality: N/A;   INTRAMEDULLARY (IM) NAIL INTERTROCHANTERIC Left 10/11/2022   Procedure: INTRAMEDULLARY (IM) NAIL INTERTROCHANTERIC;  Surgeon: Ollen Gross, MD;  Location: [facility];  Service: Orthopedics;  Laterality: Left;   IR GENERIC HISTORICAL  06/05/2016   IR RADIOLOGIST EVAL & MGMT 06/05/2016 [name], MD [facility] INTERV RAD   IVC Filter     KIDNEY SURGERY     left -"laser surgery by Dr. Fredia Sorrow- 5 yrs ago" no removal   RIGHT/LEFT HEART CATH AND CORONARY  ANGIOGRAPHY N/A 04/25/2022   Procedure: RIGHT/LEFT HEART CATH AND CORONARY ANGIOGRAPHY;  Surgeon: Tonny Bollman, MD;  Location: [facility];  Service: Cardiovascular;  Laterality: N/A;   SCHLEROTHERAPY  05/24/2018   Procedure: Theresia Majors;  Surgeon: Lynann Bologna, MD;  Location: [facility];  Service: Endoscopy;;   SUBMUCOSAL TATTOO INJECTION  04/13/2021   Procedure: SUBMUCOSAL TATTOO INJECTION;  Surgeon: Benancio Deeds, MD;   Location: [facility];  Service: Gastroenterology;;   TONSILLECTOMY     TUBAL LIGATION      Allergies  Allergies  Allergen Reactions   Codeine Nausea Only    Hallucinations, too   Other Nausea And Vomiting and Other (See Comments)    "-mycin" antibiotics.   Also cause hallucinations.   Erythromycin Nausea And Vomiting   Iodinated Contrast Media Other (See Comments)    Renal issues   Lisinopril     Pt doesn't remember reaction   Mirtazapine Other (See Comments)    "Threw me into orbit"   Mycophenolate Mofetil Nausea Only   Sulfa Antibiotics Other (See Comments)    Unknown reaction    History of Present Illness    Paula Ward 87 year old female has a PMH of essential hypertension, pulmonary hypertension, pericardial effusion, chronic diastolic CHF, AVM, moderate aortic stenosis, orthostatic hypotension, interstitial lung disease, GERD, CKD stage III, PE, incontinence, scleroderma, anemia, generalized weakness, vitamin B12 deficiency, and macrocytic anemia.  Her PMH also includes gastrointestinal bleeding secondary to AVM malformations which required IVC filter placement and breast CA.  She underwent cardiac catheterization 7/23 which showed no obstructive coronary disease, pulmonary hypertension, moderate aortic stenosis with a mean gradient of 16 mmHg.  She was seen in the emergency room on 06/04/2023 due to increased shortness of breath, chest tightness, and lower extremity edema.  Her oxygen saturation was noted to be 80% on room air.  Her BNP was 2680.  Her chest x-ray showed stable cardiomegaly and pericardial effusion with no pulmonary edema or pulmonary effusion.  Echocardiogram during that time showed LVEF of 70-75%, moderate concentric LVH, mildly reduced RV function and severely dilated LA.  Large pericardial effusion was noted.  This was slightly increased from June.  There was no evidence of tamponade or severe MAC and moderate AS was noted.  She received IV diuresis and  was discharged in stable condition 06/06/2023.  She was seen in follow-up by Reather Littler NP on 06/19/2023.  She reported that she was doing well from a cardiac standpoint.  She denied chest pain and felt her breathing had returned to baseline.  She had been monitoring her weights at home which were stable.  She was having regular lab work at the cancer center and had planned lab work on 06/30/2023.  She contacted the nurse triage line 08/04/2023 and reported that she had increased lower extremity swelling.  She reported a 10 pound weight gain since 8/15 and another 2 pound weight gain since 9/25.  She was taking furosemide 40 mg once daily.  She continued to use lower extremity support stockings.  She was instructed to increase her furosemide to 40 mg twice daily.  She was added to my schedule.  She presented to the clinic 08/06/23  for follow-up evaluation and stated she noticed a 6 pound weight gain overnight.  She reported that she was around 100 pounds earlier in the week.  Today in the office her weight is 108.2 pounds.  She reported that she was not able to take 40 mg of Lasix 2  times per day so she has been taking 60 mg of Lasix for the past 2 days.  Her right leg is greater than her left.  She has noted to have +1-2 pitting edema to her knee her right leg and generalized nonpitting edema on her left leg.  I will continued her 60 mg of Lasix x 4 days and give her 20 mill equivalents of potassium daily. I planned repeat  BMP 1 week and planned follow-up in 1 week.   She had follow-up appointment with pulmonology that week and was stable using 3 L of oxygen nasal cannula while active.  She presented to the emergency department on 08/13/2023.  She was diagnosed with right lower extremity cellulitis.  She received IV antibiotics and diuresis.  Her furosemide was transitioned to torsemide.  Her echocardiogram showed known severe LVH, G1 DD, large pericardial effusion without tamponade.  Her lower extremity  ultrasound was negative for DVT.  There was low suspicion for true cellulitis however her Keflex was continued at discharge.  She presented to the clinic  08/21/23 for follow-up evaluation and stated she had not had good urine output with torsemide.  She had been monitoring her weight at home.  She reported that when she left the hospital her weight was 93-1/2 pounds.  It had steadily increased up to 106 pounds today.  Our clinic scale showed 108 pounds.  She had been weighing after voiding in the morning with the same shoes on.  She presented with her son who asked several questions about her fluid retention and pulmonary hypertension.  All questions were answered.  I asked her to continue daily weights, stopped torsemide and resumed furosemide 60 mg daily, ordered BMP, kept follow-up for November 14, and asked her follow-up with nephrology as scheduled.  She was admitted to the hospital on 08/29/2023.  Her weight was noted to be around 102 pounds 12 ounces.  Her echocardiogram 08/31/2023 showed an LVEF of 60-65%, mildly dilated left atria, severe mitral annular calcification with mitral stenosis.  Severe calcification of the aortic valve was also noted.  She was noted to have large circumferential pericardial effusion.  No RV diastolic collapse was noted.  She had been readmitted for weight gain, orthopnea, edema.  Her chest x-ray showed improving but persistent CHF and small pleural effusion.  She received IV diuresis.  Her SGLT2 inhibitor was restarted 08/31/2023.  She was continued on spironolactone.  Close follow-up was recommended.  She presents to the clinic today for follow-up evaluation and states she feels much better now since being discharged from the hospital.  Her weight has been stable at home.  She does have some bilateral ankle edema.  She has been compliant with her medications.  She has been stable with her weight.  We reviewed her medications and she feels much better since being started on  Jardiance.  We reviewed her recent hospitalizations and diastolic heart failure.  By discussed referral for palliative care.  She agrees to meet with them and discuss options for management/treatment.  I will have her continue her weight log, give iron rich foods and have her pare them with vitamin C and plan follow-up in 2 to 3 months.  She reports that she has upcoming appointment with hematology..  Today she denies chest pain, lower extremity edema, fatigue, palpitations, melena, hematuria, hemoptysis, diaphoresis, weakness, presyncope, syncope, orthopnea, and PND.   Home Medications    Prior to Admission medications   Medication Sig Start Date End Date Taking?  Authorizing Provider  acetaminophen (TYLENOL) 325 MG tablet Take 2 tablets (650 mg total) by mouth every 6 (six) hours as needed for moderate pain. 07/18/22   Wallis Bamberg, PA-C  albuterol (VENTOLIN HFA) 108 (90 Base) MCG/ACT inhaler INHALE 2 PUFFS INTO THE LUNGS EVERY 6 HOURS AS NEEDED FOR WHEEZING OR SHORTNESS OF BREATH 03/13/23   [name], MD  augmented betamethasone dipropionate (DIPROLENE-AF) 0.05 % cream Apply topically 2 (two) times daily as needed. 07/13/23   [provider]  clobetasol ointment (TEMOVATE) 0.05 % Apply 1 Application topically 2 (two) times daily. 04/20/23   [provider]  Cyanocobalamin (VITAMIN B-12) 1000 MCG SUBL Place 1,000 mcg under the tongue. Taking twice a week    [provider]  docusate sodium (COLACE) 100 MG capsule Take 1 capsule (100 mg total) by mouth 2 (two) times daily. Patient taking differently: Take 100 mg by mouth 2 (two) times daily. Pt takes every morning 10/15/22   Edmisten, Kristie L, PA  famotidine (PEPCID) 20 MG tablet Take 1 tablet (20 mg total) by mouth at bedtime. 02/10/23   Napoleon Form, MD  fluticasone (FLONASE) 50 MCG/ACT nasal spray Place 1 spray into both nostrils daily as needed for allergies. Patient taking differently: Place 1 spray  into both nostrils in the morning and at bedtime. 05/15/22   [name], MD  furosemide (LASIX) 20 MG tablet Take 2 tablets (40 mg total) by mouth daily. 06/19/23   Reather Littler D, NP  guaiFENesin (MUCINEX) 600 MG 12 hr tablet Take 300 mg by mouth daily.    [provider]  ipratropium (ATROVENT) 0.03 % nasal spray Place 2 sprays into both nostrils every 12 (twelve) hours. 05/12/22   Martina Sinner, MD  latanoprost (XALATAN) 0.005 % ophthalmic solution Place 1 drop into both eyes at bedtime. 09/23/22   [provider]  magic mouthwash SOLN Take 10 mLs by mouth 4 (four) times daily as needed for mouth pain. 03/22/22   Burnadette Pop, MD  mirtazapine (REMERON) 15 MG tablet Take 1 tablet (15 mg total) by mouth at bedtime. 03/13/23   [name], MD  montelukast (SINGULAIR) 10 MG tablet TAKE 1 TABLET BY MOUTH AT BEDTIME 07/31/23   [name], MD  Multiple Vitamin (MULTIVITAMIN) capsule Take 1 capsule by mouth daily.    [provider]  mupirocin ointment (BACTROBAN) 2 % Apply 1 Application topically 2 (two) times daily as needed (dry skin).    [provider]  OPSUMIT 10 MG tablet Take 1 tablet (10 mg total) by mouth daily. 07/07/23   Parrett, Virgel Bouquet, NP  OXYGEN Inhale into the lungs. 2 LMP at night and upon exertion    [provider]  pantoprazole (PROTONIX) 40 MG tablet Take 1 tablet (40 mg total) by mouth daily. 02/10/23   Napoleon Form, MD  polyethylene glycol (MIRALAX / GLYCOLAX) 17 g packet Take 17 g by mouth daily. 10/15/22   Edmisten, Lyn Hollingshead, PA  spironolactone (ALDACTONE) 25 MG tablet Take 1 tablet (25 mg total) by mouth daily. 08/03/23   Gwenlyn Fudge, FNP  sucralfate (CARAFATE) 1 GM/10ML suspension TAKE 10 MLS BY MOUTH EVERY 6 HOURS AS NEEDED FOR REFLUX 02/11/23   Napoleon Form, MD  triamcinolone cream (KENALOG) 0.1 % Apply 1 Application topically 2 (two) times daily. 07/07/23   [provider]     Family History    Family History  Problem Relation Age of Onset  Bladder Cancer Father    Diabetes Father    Prostate cancer Father    Alzheimer's disease Mother    Diabetes Sister    Lung cancer Sister         smoker   Esophageal cancer Paternal Uncle    Colon cancer Neg Hx    She indicated that her mother is deceased. She indicated that her father is deceased. She indicated that the status of her sister is unknown. She indicated that the status of her paternal uncle is unknown. She indicated that the status of her neg hx is unknown.  Social History    Social History   Socioeconomic History   Marital status: Married    Spouse name: Not on file   Number of children: 2   Years of education: Not on file   Highest education level: Not on file  Occupational History   Occupation: Retired  Tobacco Use   Smoking status: Never   Smokeless tobacco: Never  Vaping Use   Vaping status: Never Used  Substance and Sexual Activity   Alcohol use: No   Drug use: No   Sexual activity: Not Currently  Other Topics Concern   Not on file  Social History Narrative   Married '611 son- '65, 1 daughter '63; 6 children (2 adopted)SO- SOBRetirement- doing well. Marriage in good health. Patient has never smoked. Alcohol use- noPt gets regular exercise   Social Determinants of Health   Financial Resource Strain: Low Risk  (04/02/2022)   Overall Financial Resource Strain (CARDIA)    Difficulty of Paying Living Expenses: Not hard at all  Food Insecurity: No Food Insecurity (09/04/2023)   Hunger Vital Sign    Worried About Running Out of Food in the Last Year: Never true    Ran Out of Food in the Last Year: Never true  Transportation Needs: No Transportation Needs (09/04/2023)   PRAPARE - Administrator, Civil Service (Medical): No    Lack of Transportation (Non-Medical): No  Physical Activity: Inactive (04/02/2022)   Exercise Vital Sign    Days of Exercise per Week: 0 days     Minutes of Exercise per Session: 0 min  Stress: No Stress Concern Present (04/02/2022)   [employer] of Occupational Health - Occupational Stress Questionnaire    Feeling of Stress : Not at all  Social Connections: Socially Integrated (04/02/2022)   Social Connection and Isolation Panel [NHANES]    Frequency of Communication with Friends and Family: More than three times a week    Frequency of Social Gatherings with Friends and Family: More than three times a week    Attends Religious Services: More than 4 times per year    Active Member of [employer] or Organizations: Yes    Attends Engineer, structural: More than 4 times per year    Marital Status: Married  Catering manager Violence: Not At Risk (09/04/2023)   Humiliation, Afraid, Rape, and Kick questionnaire    Fear of Current or Ex-Partner: No    Emotionally Abused: No    Physically Abused: No    Sexually Abused: No     Review of Systems    General:  No chills, fever, night sweats or weight changes.  Cardiovascular:  No chest pain, dyspnea on exertion, edema, orthopnea, palpitations, paroxysmal nocturnal dyspnea. Dermatological: No rash, lesions/masses Respiratory: No cough, dyspnea Urologic: No hematuria, dysuria Abdominal:   No nausea, vomiting, diarrhea, bright red blood per rectum, melena, or hematemesis Neurologic:  No  visual changes, wkns, changes in mental status. All other systems reviewed and are otherwise negative except as noted above.  Physical Exam    VS:  BP (!) 116/56 (BP Location: Right Arm, Patient Position: Sitting, Cuff Size: Normal)   Pulse 85   Ht 5\' 3"  (1.6 m)   Wt 105 lb 3.2 oz (47.7 kg)   SpO2 93%   BMI 18.64 kg/m  , BMI Body mass index is 18.64 kg/m. GEN: Well nourished, well developed, in no acute distress. HEENT: normal. Neck: Supple, no JVD, carotid bruits, or masses. Cardiac: RRR, 3/6 systolicmurmur heard along right sternal border., rubs, or gallops. No clubbing, cyanosis, right  greater than left ankle edema.  Radials/DP/PT 2+ and equal bilaterally.  Respiratory:  Respirations regular and unlabored,  GI: Soft, nontender, nondistended, BS + x 4. MS: no deformity or atrophy. Skin: warm and dry, no rash. Neuro:  Strength and sensation are intact. Psych: Normal affect.  Accessory Clinical Findings    Recent Labs: 06/03/2023: Pro B Natriuretic peptide (BNP) 2,680.0 08/29/2023: ALT 13; B Natriuretic Peptide 2,168.5 09/03/2023: BUN 55; Creatinine, Ser 1.05; Hemoglobin 8.3; Magnesium 2.4; Platelets 159; Potassium 4.1; Sodium 134   Recent Lipid Panel    Component Value Date/Time   CHOL 158 06/24/2018 1004   TRIG 112.0 06/24/2018 1004   HDL 57.50 06/24/2018 1004   CHOLHDL 3 06/24/2018 1004   VLDL 22.4 06/24/2018 1004   LDLCALC 78 06/24/2018 1004         ECG personally reviewed by me today-  none today.     Echocardiogram 06/05/2023  IMPRESSIONS     1. Left ventricular ejection fraction, by estimation, is 70 to 75%. The  left ventricle has hyperdynamic function. The left ventricle has no  regional wall motion abnormalities. There is moderate concentric left  ventricular hypertrophy. Indeterminate  diastolic filling due to E-A fusion.   2. Right ventricular systolic function is mildly reduced. The right  ventricular size is normal.   3. Left atrial size was severely dilated.   4. Large pericardial effusion surrounds heart, mainly posteriorly and  lateral to LV. Naxunak dimension is 5.6 cm posteriorly. It has increased  in size from echo done in June 2024. Does not meet criteria for tamponade  by echo criteria. Clinical  correlation indicated .Marland Kitchen Large pericardial effusion.   5. MV annulus is severely calcified. MV is thickened with restricted  motion. Difficult to see well Mean gradient through the MV is 8 mm Hg. MVA  (Pt1/2 ) 3 cm2. Compared to echo report from June 2024, mean gradient is  incresaed (5 to 8 mm Hg) Consider  TEE to evaluate further..  Trivial mitral valve regurgitation.   6. AV is thickened, calcified with restricted motion. Peak and mean  gradients through the valve are 59 and 37 mm Hg respectively.  Dimensionless index is 0.33 consistent with moderate AS.Marland Kitchen The aortic valve  is abnormal. Aortic valve regurgitation is not  visualized.   7. The inferior vena cava is normal in size with <50% respiratory  variability, suggesting right atrial pressure of 8 mmHg.   FINDINGS   Left Ventricle: Left ventricular ejection fraction, by estimation, is 70  to 75%. The left ventricle has hyperdynamic function. The left ventricle  has no regional wall motion abnormalities. The left ventricular internal  cavity size was small. There is  moderate concentric left ventricular hypertrophy. Indeterminate diastolic  filling due to E-A fusion.   Right Ventricle: The right ventricular size is normal. Right  vetricular  wall thickness was not assessed. Right ventricular systolic function is  mildly reduced.   Left Atrium: Left atrial size was severely dilated.   Right Atrium: Right atrial size was normal in size.   Pericardium: Large pericardial effusion surrounds heart, mainly  posteriorly and lateral to LV. Naxunak dimension is 5.6 cm posteriorly. It  has increased in size from echo done in June 2024. Does not meet criteria  for tamponade by echo criteria. Clinical  correlation indicated. A large pericardial effusion is present.   Mitral Valve: MV annulus is severely calcified. MV is thickened with  restricted motion. Difficult to see well Mean gradient through the MV is 8  mm Hg. MVA (Pt1/2 ) 3 cm2. Compared to echo report from June 2024, mean  gradient is incresaed (5 to 8 mm Hg)  Consider TEE to evaluate further. Trivial mitral valve regurgitation. MV  peak gradient, 13.2 mmHg. The mean mitral valve gradient is 8.0 mmHg.   Tricuspid Valve: The tricuspid valve is normal in structure. Tricuspid  valve regurgitation is mild.    Aortic Valve: AV is thickened, calcified with restricted motion. Peak and  mean gradients through the valve are 59 and 37 mm Hg respectively.  Dimensionless index is 0.33 consistent with moderate AS. The aortic valve  is abnormal. Aortic valve regurgitation   is not visualized. Aortic valve mean gradient measures 37.0 mmHg. Aortic  valve peak gradient measures 58.7 mmHg. Aortic valve area, by VTI measures  0.95 cm.   Pulmonic Valve: The pulmonic valve was not well visualized. Pulmonic valve  regurgitation is mild.   Aorta: The aortic root and ascending aorta are structurally normal, with  no evidence of dilitation.   Venous: The inferior vena cava is normal in size with less than 50%  respiratory variability, suggesting right atrial pressure of 8 mmHg.   IAS/Shunts: No atrial level shunt detected by color flow Doppler.   Echocardiogram 08/15/2023 IMPRESSIONS     1. Left ventricular ejection fraction, by estimation, is >75%. The left  ventricle has hyperdynamic function. The left ventricle has no regional  wall motion abnormalities. There is severe left ventricular hypertrophy.  Left ventricular diastolic  parameters are consistent with Grade I diastolic dysfunction (impaired  relaxation).   2. Right ventricular systolic function is normal. The right ventricular  size is normal.   3. Left atrial size was severely dilated.   4. Large pericardial effusion. The pericardial effusion is  circumferential. There is no evidence of cardiac tamponade.   5. The mitral valve is normal in structure. No evidence of mitral valve  regurgitation. No evidence of mitral stenosis. Severe mitral annular  calcification.   6. The aortic valve is normal in structure. There is severe calcifcation  of the aortic valve. There is moderate thickening of the aortic valve.  Aortic valve regurgitation is not visualized. Mild aortic valve stenosis.  Aortic valve mean gradient measures   14.8 mmHg. Aortic  valve Vmax measures 2.59 m/s.   7. The inferior vena cava is normal in size with greater than 50%  respiratory variability, suggesting right atrial pressure of 3 mmHg.   Comparison(s): Large pericardial effusion noted on prior ECHO, no major  change.   Conclusion(s)/Recommendation(s): Findings consistent with hypertrophic  cardiomyopathy.   FINDINGS   Left Ventricle: Left ventricular ejection fraction, by estimation, is  >75%. The left ventricle has hyperdynamic function. The left ventricle has  no regional wall motion abnormalities. The left ventricular internal  cavity size was normal  in size. There  is severe left ventricular hypertrophy. Left ventricular diastolic  parameters are consistent with Grade I diastolic dysfunction (impaired  relaxation).   Right Ventricle: The right ventricular size is normal. No increase in  right ventricular wall thickness. Right ventricular systolic function is  normal.   Left Atrium: Left atrial size was severely dilated.   Right Atrium: Right atrial size was normal in size.   Pericardium: A large pericardial effusion is present. The pericardial  effusion is circumferential. There is no evidence of cardiac tamponade.   Mitral Valve: The mitral valve is normal in structure. Severe mitral  annular calcification. No evidence of mitral valve regurgitation. No  evidence of mitral valve stenosis.   Tricuspid Valve: The tricuspid valve is normal in structure. Tricuspid  valve regurgitation is mild . No evidence of tricuspid stenosis.   Aortic Valve: The aortic valve is normal in structure. There is severe  calcifcation of the aortic valve. There is moderate thickening of the  aortic valve. Aortic valve regurgitation is not visualized. Mild aortic  stenosis is present. Aortic valve mean  gradient measures 14.8 mmHg. Aortic valve peak gradient measures 26.9  mmHg. Aortic valve area, by VTI measures 1.12 cm.   Pulmonic Valve: The pulmonic valve  was normal in structure. Pulmonic valve  regurgitation is not visualized. No evidence of pulmonic stenosis.   Aorta: The aortic root is normal in size and structure.   Venous: The inferior vena cava is normal in size with greater than 50%  respiratory variability, suggesting right atrial pressure of 3 mmHg.   IAS/Shunts: No atrial level shunt detected by color flow Doppler.   Echocardiogram 08/31/2023 IMPRESSIONS     1. Left ventricular ejection fraction, by estimation, is 60 to 65%. The  left ventricle has normal function. The left ventricle has no regional  wall motion abnormalities.   2. Right ventricular systolic function is normal. The right ventricular  size is normal.   3. Left atrial size was mildly dilated.   4. Severe mitral annular calcification. Visually there is mitral stenosis  but doppler interrogation was not done.   5. The aortic valve is tricuspid. There is severe calcifcation of the  aortic valve. Visually there is aortic stenosis but doppler interrogation  was not done.   6. The inferior vena cava is normal in size with <50% respiratory  variability, suggesting right atrial pressure of 8 mmHg.   7. There was a large circumferential pericardial effusion. There was no  RV diastolic collapse noted. Mitral E inflow velocity showed < 25%  respirophasic variation. The IVC was not significantly dilated. There does  not appear to be tamponade physiology.   8. Limited echo.   FINDINGS   Left Ventricle: Left ventricular ejection fraction, by estimation, is 60  to 65%. The left ventricle has normal function. The left ventricle has no  regional wall motion abnormalities. The left ventricular internal cavity  size was normal in size.   Right Ventricle: The right ventricular size is normal. No increase in  right ventricular wall thickness. Right ventricular systolic function is  normal.   Left Atrium: Left atrial size was mildly dilated.   Right Atrium: Right atrial  size was normal in size.   Pericardium: There was a large circumferential pericardial effusion. There  was no RV diastolic collapse noted. Mitral E inflow velocity showed < 25%  respirophasic variation. The IVC was not significantly dilated. There does  not appear to be tamponade  physiology.  Mitral Valve: There is moderate calcification of the mitral valve  leaflet(s). Severe mitral annular calcification.   Aortic Valve: The aortic valve is tricuspid. There is severe calcifcation  of the aortic valve.   Venous: The inferior vena cava is normal in size with less than 50%  respiratory variability, suggesting right atrial pressure of 8 mmHg.   Additional Comments: Spectral Doppler performed. Color Doppler performed.    IVC  IVC diam: 1.50 cm   Dalton McleanMD  Electronically signed by Wilfred Lacy  Signature Date/Time: 08/31/2023/4:29:29 PM      Cardiac catheterization 04/25/2022  1.  Ectatic but patent coronary arteries with no obstructive disease.  Right dominant coronary artery. 2.  Mild pulmonary hypertension with PA pressure 52/15, mean 28 mmHg, transpulmonary gradient 10 mmHg, PVR approximately 2 Wood units. 3.  Moderate aortic stenosis with mean transvalvular gradient 16 mmHg calculated aortic valve area 1.16 cm 4.  Heavy mitral annular calcification by plain fluoroscopy 5.  Mild aortic valve calcification by plain fluoroscopy   Recommend: Continued medical therapy, clinical follow-up, echo surveillance.  The patient does not appear to have severe aortic stenosis and I do not think she would benefit from TAVR at this time.   Diagnostic Dominance: Right  Intervention   Assessment & Plan   1.  Acute on chronic diastolic CHF-ankle edema bilaterally. Wt today 105.32.  Reviewed hospitalization 08/29/2023 through 09/03/2023.  Discussed option for referral to palliative care. Continue  Jardiance, spironolactone,Lasix  Heart healthy low-sodium diet Continue lower  extremity support stockings Continue daily weights-contact office with a weight increase of 2 to 3 pounds overnight or 5 pounds in 1 week. Ambulatory referral to palliative care  Essential hypertension-BP today 116/56 Maintain blood pressure log Continue current medical therapy Heart healthy low-sodium diet  Pulmonary hypertension-breathing at baseline.  Follows with [facility] pulmonary hypertension clinic. Using 3Lof O2 in clinic today.  Not wearing oxygen in room during exam. Continue current medical therapy  Aortic stenosis-continues to use 2-3 L of oxygen via nasal cannula.   Plan for repeat echocardiogram in 6 months. Continue current medical therapy Increase physical activity as tolerated  CKD stage III-creatinine 1.05 on 09/03/23.  In the setting of hospitalization and diuresis.  Avoid nephrotoxic agents.  Has an appointment with nephrology in the near future. Follows with PCP  Disposition: Follow-up with Dr. Jens Som or me in 2-3 month.   Thomasene Ripple. Zaina Jenkin NP-C     09/09/2023, 2:03 PM [facility] [address] Office [phone] Fax [phone]    I spent 14 minutes examining this patient, reviewing medications, and using patient centered shared decision making involving her cardiac care.   I spent greater than 20 minutes reviewing her past medical history,  medications, and prior cardiac tests.

## 2023-09-07 DIAGNOSIS — I5033 Acute on chronic diastolic (congestive) heart failure: Secondary | ICD-10-CM | POA: Diagnosis not present

## 2023-09-07 DIAGNOSIS — J9611 Chronic respiratory failure with hypoxia: Secondary | ICD-10-CM | POA: Diagnosis not present

## 2023-09-07 DIAGNOSIS — J9612 Chronic respiratory failure with hypercapnia: Secondary | ICD-10-CM | POA: Diagnosis not present

## 2023-09-07 DIAGNOSIS — M341 CR(E)ST syndrome: Secondary | ICD-10-CM | POA: Diagnosis not present

## 2023-09-07 DIAGNOSIS — N1832 Chronic kidney disease, stage 3b: Secondary | ICD-10-CM | POA: Diagnosis not present

## 2023-09-07 DIAGNOSIS — M349 Systemic sclerosis, unspecified: Secondary | ICD-10-CM | POA: Diagnosis not present

## 2023-09-07 DIAGNOSIS — I130 Hypertensive heart and chronic kidney disease with heart failure and stage 1 through stage 4 chronic kidney disease, or unspecified chronic kidney disease: Secondary | ICD-10-CM | POA: Diagnosis not present

## 2023-09-07 DIAGNOSIS — D631 Anemia in chronic kidney disease: Secondary | ICD-10-CM | POA: Diagnosis not present

## 2023-09-07 DIAGNOSIS — I3139 Other pericardial effusion (noninflammatory): Secondary | ICD-10-CM | POA: Diagnosis not present

## 2023-09-07 NOTE — Telephone Encounter (Signed)
Patient is discontinuing Opsumit per most recent hospitalization notes. Closing encounter.  Chesley Mires, PharmD, MPH, BCPS, CPP Clinical Pharmacist (Rheumatology and Pulmonology)

## 2023-09-09 ENCOUNTER — Ambulatory Visit: Payer: Medicare PPO | Attending: General Practice | Admitting: General Practice

## 2023-09-09 ENCOUNTER — Encounter: Payer: Self-pay | Admitting: General Practice

## 2023-09-09 VITALS — BP 116/56 | HR 85 | Ht 63.0 in | Wt 105.2 lb

## 2023-09-09 DIAGNOSIS — N1832 Chronic kidney disease, stage 3b: Secondary | ICD-10-CM

## 2023-09-09 DIAGNOSIS — I350 Nonrheumatic aortic (valve) stenosis: Secondary | ICD-10-CM

## 2023-09-09 DIAGNOSIS — I10 Essential (primary) hypertension: Secondary | ICD-10-CM | POA: Diagnosis not present

## 2023-09-09 DIAGNOSIS — I5033 Acute on chronic diastolic (congestive) heart failure: Secondary | ICD-10-CM | POA: Diagnosis not present

## 2023-09-09 DIAGNOSIS — I272 Pulmonary hypertension, unspecified: Secondary | ICD-10-CM | POA: Diagnosis not present

## 2023-09-09 NOTE — Patient Instructions (Addendum)
Other instructions  Keep doing  daily weight   and  following low Salt  diet  Contact office if you gain 2 - 3 lbs overnight or 5 lb in a week   Medication Instructions:  No changes   *If you need a refill on your cardiac medications before your next appointment, please call your pharmacy*   Lab Work: Not needed    Testing/Procedures:  Not needed  Follow-Up: At [facility], you and your health needs are our priority.  As part of our continuing mission to provide you with exceptional heart care, we have created designated Provider Care Teams.  These Care Teams include your primary Cardiologist (physician) and Advanced Practice Providers (APPs -  Physician Assistants and Nurse Practitioners) who all work together to provide you with the care you need, when you need it.     Your next appointment:   2- 3 month(s)  The format for your next appointment:   In Person  Provider:   Oris Drone NP   Other Instructions  You have been referred to  Palliative Care _ [facility]    Encourage to eat Iron -enrich foods see list and pair it with Vit-C enrich food and drink for absorption   Iron enrich foods   Tofu  Lentils  Fortified breakfast cereals  Spinach  Broccoli  Dried apricots  Egg  Cashews  Pumpkin seed  Red meat  Beef  Liver  Molasses  Nuts  Raisins  Seeds  Strawberries  Enriched white bread  Liver and onions  Offal Dark leafy greens   Vitamin C fruits and vegetables  Strawberry  Bell peppers  Kiwi  Broccoli  Guava  Papaya  Brussels sprouts  Potato  Tomatoes  Kale  Mango  Orange  Cauliflower  Pineapple  Blackcurrant  Cantaloupe  Citrus Fruits  Grapefruit  Peas  Cabbage  Lemon  Spinach  Acerola cherry  Green bell pepper

## 2023-09-10 ENCOUNTER — Other Ambulatory Visit: Payer: Self-pay | Admitting: *Deleted

## 2023-09-10 DIAGNOSIS — D631 Anemia in chronic kidney disease: Secondary | ICD-10-CM | POA: Diagnosis not present

## 2023-09-10 DIAGNOSIS — N1832 Chronic kidney disease, stage 3b: Secondary | ICD-10-CM | POA: Diagnosis not present

## 2023-09-10 DIAGNOSIS — I130 Hypertensive heart and chronic kidney disease with heart failure and stage 1 through stage 4 chronic kidney disease, or unspecified chronic kidney disease: Secondary | ICD-10-CM | POA: Diagnosis not present

## 2023-09-10 DIAGNOSIS — I3139 Other pericardial effusion (noninflammatory): Secondary | ICD-10-CM | POA: Diagnosis not present

## 2023-09-10 DIAGNOSIS — J9611 Chronic respiratory failure with hypoxia: Secondary | ICD-10-CM | POA: Diagnosis not present

## 2023-09-10 DIAGNOSIS — J9612 Chronic respiratory failure with hypercapnia: Secondary | ICD-10-CM | POA: Diagnosis not present

## 2023-09-10 DIAGNOSIS — M341 CR(E)ST syndrome: Secondary | ICD-10-CM | POA: Diagnosis not present

## 2023-09-10 DIAGNOSIS — M349 Systemic sclerosis, unspecified: Secondary | ICD-10-CM | POA: Diagnosis not present

## 2023-09-10 DIAGNOSIS — I5033 Acute on chronic diastolic (congestive) heart failure: Secondary | ICD-10-CM | POA: Diagnosis not present

## 2023-09-10 NOTE — Patient Instructions (Addendum)
Visit Information  Thank you for taking time to visit with me today. Please don't hesitate to contact me if I can be of assistance to you before our next scheduled telephone appointment.  Our next appointment is by telephone on Tuesday 09/15/23 at 10:00 am  Please call the care guide team at [phone] if you need to cancel or reschedule your appointment.   Following are the goals we discussed today:  Patient Goals/Self-Care Activities: Participate in Transition of Care Program/Attend TOC scheduled calls Take all medications as prescribed Attend all scheduled provider appointments Call provider office for new concerns or questions  Continue pacing activity to avoid episodes of shortness of breath Continue to follow your established action plan for episodes of shortness of breath Continue using home oxygen as prescribed Use assistive devices as needed to prevent falls Continue actively working with the home health team that is coming to your home Please tell your doctors that you are using the same nebulizer and nebulizer mouth piece that your husband is using-- you need your own nebulizer equipment to make sure you are not spreading germs-- I have also made your doctors aware of this  If you are experiencing a Mental Health or Behavioral Health Crisis or need someone to talk to, please  call the Suicide and Crisis Lifeline: 988 call the [place] National Suicide Prevention Lifeline: 709 280 1329 or TTY: (708) 064-5207 TTY (732)083-2010) to talk to a trained counselor call 1-800-273-TALK (toll free, 24 hour hotline) go to [facility] [address], [place] [id number]) call the [facility] Crisis Line: (646)870-0504 call 911   Patient verbalizes understanding of instructions and care plan provided today and agrees to view in MyChart. Active MyChart status and patient understanding of how to access instructions and care plan via MyChart  confirmed with patient.     Caryl Pina, RN, BSN, Media planner  Transitions of Care  [facility] - [facility] [phone]: direct office

## 2023-09-10 NOTE — Patient Outreach (Signed)
Care Management  Transitions of Care Program Transitions of Care Post-discharge week 2/ day # 6   09/10/2023 Name: Paula Ward MRN: [id number] DOB: 06/05/1936  Subjective: Paula Ward is a 87 y.o. year old female who is a primary care patient of [name], MD. The Care Management team Engaged with patient Engaged with patient by telephone to assess and address transitions of care needs.   Consent to Services:  Patient was given information about care management services, agreed to services, and gave verbal consent to participate.  Enrolled 09/10/23  Assessment: "I am fine I guess. Doctor gave me a good report yesterday and it was fine for me to start driving again, so I am out now and almost home.  I have not heard one thing about the nebulizer and yes, I am still using my husband's machine and we are using the same mouthpiece.... I will try to remember to ask the doctors about it.  The home health people are coming out to my house now"          SDOH Interventions    Flowsheet Row Telephone from 09/04/2023 in [place] POPULATION HEALTH DEPARTMENT Telephone from 08/18/2023 in [facility] Community Care Coordination Telephone from 06/09/2023 in [facility] Community Care Coordination Clinical Support from 04/02/2022 in [facility] [place]  SDOH Interventions      Food Insecurity Interventions Intervention Not Indicated Intervention Not Indicated Intervention Not Indicated Intervention Not Indicated  Housing Interventions Intervention Not Indicated -- -- Intervention Not Indicated  Transportation Interventions Intervention Not Indicated  [reports has private duty transportation services through "[employer]"] Intervention Not Indicated  [drives self at baseline,  family/ friends assisting post-recent hospital discharge] Intervention Not Indicated  [normally drives self,  friends and family currently assisiting  post-recent hospital discharge] Intervention Not Indicated  Utilities Interventions Intervention Not Indicated -- -- --  Surveyor, quantity Strain Interventions -- -- -- Intervention Not Indicated  Physical Activity Interventions -- -- -- Intervention Not Indicated  Stress Interventions -- -- -- Intervention Not Indicated  Social Connections Interventions -- -- -- Intervention Not Indicated        Goals Addressed             This Visit's Progress    TOC 30-day Program Care Plan   On track    Current Barriers:  Home Health services [facility]: PT/OT- verified ordered with review of IP TOC note; contacted Alycia RossettiFirefighter at [phone]: he reports "problem" with way inpatient order was received: "they did not put 'evaluate and treat on order;" lengthy care coordination outreach with multiple personnel at [facility] prior to speaking with director Alycia Rossetti- Alycia Rossetti reports he will push order through as is- and we discussed possibility of adding of nursing if PCP is agreeable- Alycia Rossetti will follow up to facilitate initiating services 09/10/23: confirmed home health services through [facility] now active Equipment/DME Patient reports she is using her husband's nebulizer- states "they ordered the medicine for me at the hospital, but not a machine; they said it is okay to use my husband's nebulizer- she states that she is "not sure" if they ordered a separate mouth piece for her to use-- she has been using the same mouth piece as her husband: she was advised to stop using same-- will make pulmonary provider aware in an effort to facilitate getting patient her own mouth piece vs. own nebulizer; there is nothing related to this that I could find in review of EHR  discharge instructions- I was only able to verify that new nebulizer medication was ordered at time of hospital discharge 09/10/23: re-confirmed with patient that she continues using her husband's nebulizer. Including the same nebulizer mouthpiece: have not heard  back from any of the providers I sent original message to on 09/04/23: will re-message care providers and ask for response; patient confirms she has not heard anything else about the nebulizer "from anyone" Fragile state of health with multiple chronic conditions; multiple hospital and ED visits, some within 30-days of one another; question palliative care vs. hospice readiness  RNCM Clinical Goal(s):  Patient will work with the Care Management team over the next 30 days to address Transition of Care Barriers: Home Health services Equipment/DME Possible need for palliative care vs. Hospice referral take all medications exactly as prescribed and will call provider for medication related questions as evidenced by patient reporting of same during [facility] 30-day program outreaches attend all scheduled medical appointments: 09/09/23- cardiology provider; 09/14/23- PCP; 09/16/23- pulmonary provider as evidenced by review of same with patient during TOC 30-day program outreaches demonstrate Ongoing adherence to prescribed treatment plan for CHF as evidenced by patient reporting during TOC 30-day program outreaches continue to work with RN Care Manager to address care management and care coordination needs related to  CHF as evidenced by adherence to CM Team Scheduled appointments work with home health team once they are established in patient's care to participate in PT and OT services as evidenced by patient reporting and collaboration as indicated with home health team not experience hospital admission as evidenced by review of EMR. Hospital Admissions in last 6 months = 3  through collaboration with RN Care manager, provider, and care team.   Interventions: Evaluation of current treatment plan related to  self management and patient's adherence to plan as established by provider  Transitions of Care:  Goal on track:  Yes. 09/10/23 Durable Medical Equipment (DME) needs assessed with  patient/caregiver Doctor Visits  - discussed the importance of doctor visits Contacted provider for patient needs confirmed by review of EHR that cardiology provider placed palliative care referral on 09/09/23; ongoing need to explore options around getting patient a nebulizer mouth piece/ machine for her individual use-- currently she is still using her husband's nebulizer Reviewed recent cardiology provider office visit on 09/09/23: she reports "the visit went fine, he said it was fine for me to start driving again;" verified no medication changes made during visit Confirmed home health team is now active and making home visits: encouraged her ongoing engagement with home health team Reinforced rationale for daily weight monitoring at home along with weight gain guidelines/ action plan for weight gain; importance of taking diuretic as prescribed  Reinforced/ provided education around safe use/ maintenance of home O2 Provided reinforcement around previous education re: need for her own nebulizer equipment-- concerns around infection with her using her husband's equipment  Patient Goals/Self-Care Activities: Participate in Transition of Care Program/Attend TOC scheduled calls Take all medications as prescribed Attend all scheduled provider appointments Call provider office for new concerns or questions  Continue pacing activity to avoid episodes of shortness of breath Continue to follow your established action plan for episodes of shortness of breath Continue using home oxygen as prescribed Use assistive devices as needed to prevent falls Continue actively working with the home health team that is coming to your home Please tell your doctors that you are using the same nebulizer and nebulizer mouth piece that your husband is using--  you need your own nebulizer equipment to make sure you are not spreading germs-- I have also made your doctors aware of this  Follow Up Plan:  Telephone follow up  appointment with care management team member scheduled for:  Tuesday 09/15/23 at 10:00 am          Plan: Telephone follow up appointment with care management team member scheduled for:  Tuesday 09/15/23 at 10:00 am   Caryl Pina, RN, BSN, Media planner  Transitions of Care  [facility] - [facility] [phone]: direct office

## 2023-09-11 ENCOUNTER — Telehealth: Payer: Self-pay | Admitting: *Deleted

## 2023-09-11 ENCOUNTER — Telehealth: Payer: Self-pay | Admitting: Internal Medicine

## 2023-09-11 ENCOUNTER — Telehealth: Payer: Self-pay | Admitting: Pulmonary Disease

## 2023-09-11 DIAGNOSIS — I5032 Chronic diastolic (congestive) heart failure: Secondary | ICD-10-CM

## 2023-09-11 DIAGNOSIS — J9611 Chronic respiratory failure with hypoxia: Secondary | ICD-10-CM

## 2023-09-11 DIAGNOSIS — J849 Interstitial pulmonary disease, unspecified: Secondary | ICD-10-CM

## 2023-09-11 NOTE — Telephone Encounter (Signed)
HH ORDERS   Caller Name: [facility] Agency Name: [facility] [employer] Phone #: [phone](secure)  Service Requested: PT (examples: OT/PT/Skilled Nursing/Social Work/Speech Therapy/Wound Care)  Frequency of Visits: 1X a week for 2 weeks, 2X a week for 3 weeks, 1X a week for 4 weeks

## 2023-09-11 NOTE — Patient Outreach (Addendum)
Care Coordination   Care Coordination Telephone  Visit Note   09/11/2023 Name: Paula Ward MRN: [id number] DOB: April 04, 1936  Paula Ward is a 87 y.o. year old female who sees [name], MD for primary care. I  Contacted [place] pulmonary office/ spoke with "Hughes Better" to explain delay in getting patient nebulizer post-hospital discharge on 09/03/23- Hughes Better will forward request to Dr. Laurena Spies nurse for prompt/ urgent facilitation of getting new nebulizer order    Goals Addressed             This Visit's Progress    TOC 30-day Program Care Plan   On track    Current Barriers:  Home Health services [facility]: PT/OT- verified ordered with review of IP TOC note; contacted Alycia Rossetti- Interior and spatial designer at [phone]: he reports "problem" with way inpatient order was received: "they did not put 'evaluate and treat on order;" lengthy care coordination outreach with multiple personnel at [facility] prior to speaking with director Alycia Rossetti- Alycia Rossetti reports he will push order through as is- and we discussed possibility of adding of nursing if PCP is agreeable- Alycia Rossetti will follow up to facilitate initiating services 09/10/23: confirmed home health services through [facility] now active Equipment/DME Patient reports she is using her husband's nebulizer- states "they ordered the medicine for me at the hospital, but not a machine; they said it is okay to use my husband's nebulizer- she states that she is "not sure" if they ordered a separate mouth piece for her to use-- she has been using the same mouth piece as her husband: she was advised to stop using same-- will make pulmonary provider aware in an effort to facilitate getting patient her own mouth piece vs. own nebulizer; there is nothing related to this that I could find in review of EHR discharge instructions- I was only able to verify that new nebulizer medication was ordered at time of hospital discharge 09/10/23: re-confirmed with patient that she  continues using her husband's nebulizer. Including the same nebulizer mouthpiece: have not heard back from any of the providers I sent original message to on 09/04/23: will re-message care providers and ask for response; patient confirms she has not heard anything else about the nebulizer "from anyone" 09/11/23: care coordination outreach to pulmonary provider office/ Dr. Judeth Horn [phone]- spoke with "Hughes Better;" explained that patient did not receive nebulizer post- hospital discharge, and that new orders need to be initiated so that patient does not have to continue using her husband's machine/ mouthpiece to take nebulizer treatments; Hughes Better states she will have Dr. Laurena Spies contact patient to take action in facilitating new order being placed- explained urgency of new order- as patient was discharged on 09/03/23 Fragile state of health with multiple chronic conditions; multiple hospital and ED visits, some within 30-days of one another; question palliative care vs. hospice readiness  RNCM Clinical Goal(s):  Patient will work with the Care Management team over the next 30 days to address Transition of Care Barriers: Home Health services Equipment/DME Possible need for palliative care vs. Hospice referral take all medications exactly as prescribed and will call provider for medication related questions as evidenced by patient reporting of same during [facility] 30-day program outreaches attend all scheduled medical appointments: 09/09/23- cardiology provider; 09/14/23- PCP; 09/16/23- pulmonary provider as evidenced by review of same with patient during TOC 30-day program outreaches demonstrate Ongoing adherence to prescribed treatment plan for CHF as evidenced by patient reporting during [facility] 30-day program outreaches continue to work with Medical illustrator to address care  management and care coordination needs related to  CHF as evidenced by adherence to CM Team Scheduled appointments work with home  health team once they are established in patient's care to participate in PT and OT services as evidenced by patient reporting and collaboration as indicated with home health team not experience hospital admission as evidenced by review of EMR. Hospital Admissions in last 6 months = 3  through collaboration with RN Care manager, provider, and care team.   Interventions: Evaluation of current treatment plan related to  self management and patient's adherence to plan as established by provider  Transitions of Care:  Goal on track:  Yes. 09/10/23 Durable Medical Equipment (DME) needs assessed with patient/caregiver Doctor Visits  - discussed the importance of doctor visits Contacted provider for patient needs confirmed by review of EHR that cardiology provider placed palliative care referral on 09/09/23; ongoing need to explore options around getting patient a nebulizer mouth piece/ machine for her individual use-- currently she is still using her husband's nebulizer Reviewed recent cardiology provider office visit on 09/09/23: she reports "the visit went fine, he said it was fine for me to start driving again;" verified no medication changes made during visit Confirmed home health team is now active and making home visits: encouraged her ongoing engagement with home health team Reinforced rationale for daily weight monitoring at home along with weight gain guidelines/ action plan for weight gain; importance of taking diuretic as prescribed  Reinforced/ provided education around safe use/ maintenance of home O2 Provided reinforcement around previous education re: need for her own nebulizer equipment-- concerns around infection with her using her husband's equipment  Patient Goals/Self-Care Activities: Participate in Transition of Care Program/Attend TOC scheduled calls Take all medications as prescribed Attend all scheduled provider appointments Call provider office for new concerns or questions   Continue pacing activity to avoid episodes of shortness of breath Continue to follow your established action plan for episodes of shortness of breath Continue using home oxygen as prescribed Use assistive devices as needed to prevent falls Continue actively working with the home health team that is coming to your home Please tell your doctors that you are using the same nebulizer and nebulizer mouth piece that your husband is using-- you need your own nebulizer equipment to make sure you are not spreading germs-- I have also made your doctors aware of this  Follow Up Plan:  Telephone follow up appointment with care management team member scheduled for:  Tuesday 09/15/23 at 10:00 am          SDOH assessments and interventions completed:  No Previously completed-- this is a care coordination encounter    Care Coordination Interventions:  Yes, provided   Follow up plan:  ongoing TOC 30-day program outreaches as scheduled    Encounter Outcome:  Patient Visit Completed   Caryl Pina, RN, BSN, Media planner  Transitions of Care  [facility] - [facility] [phone]: direct office

## 2023-09-11 NOTE — Telephone Encounter (Signed)
Nurse w/[facility] calling: PT was recently in the Hospital and inpatient team should have ordered a nebulizer for her and did not. Can we please order one for her? Right now she is using her husbands.  PT can reached @ [phone]   Please call pt w/action taken.

## 2023-09-11 NOTE — Telephone Encounter (Signed)
ATC patient on # left, no answer and VM full.  Called patient's home # and was able to speak with the patient.  Patient was sent home from the hospital on new nebulizer medication, but no nebulizer machine.  Advised that I would send an order to [facility] for the Nebulizer machine and they would contact her about getting the machine to her.  She verbalized understanding.  Nothing further needed.

## 2023-09-11 NOTE — Telephone Encounter (Signed)
Ok for verbal but will need to keep follow up visit

## 2023-09-14 ENCOUNTER — Ambulatory Visit: Payer: Medicare PPO | Admitting: Internal Medicine

## 2023-09-14 ENCOUNTER — Encounter: Payer: Self-pay | Admitting: Internal Medicine

## 2023-09-14 ENCOUNTER — Telehealth: Payer: Self-pay | Admitting: Internal Medicine

## 2023-09-14 VITALS — BP 138/64 | HR 80 | Temp 97.4°F | Ht 63.0 in | Wt 103.0 lb

## 2023-09-14 DIAGNOSIS — D638 Anemia in other chronic diseases classified elsewhere: Secondary | ICD-10-CM | POA: Diagnosis not present

## 2023-09-14 DIAGNOSIS — I2729 Other secondary pulmonary hypertension: Secondary | ICD-10-CM

## 2023-09-14 DIAGNOSIS — I272 Pulmonary hypertension, unspecified: Secondary | ICD-10-CM | POA: Diagnosis not present

## 2023-09-14 DIAGNOSIS — J849 Interstitial pulmonary disease, unspecified: Secondary | ICD-10-CM | POA: Diagnosis not present

## 2023-09-14 DIAGNOSIS — I5032 Chronic diastolic (congestive) heart failure: Secondary | ICD-10-CM

## 2023-09-14 DIAGNOSIS — N1832 Chronic kidney disease, stage 3b: Secondary | ICD-10-CM

## 2023-09-14 LAB — COMPREHENSIVE METABOLIC PANEL
ALT: 13 U/L (ref 0–35)
AST: 27 U/L (ref 0–37)
Albumin: 4.3 g/dL (ref 3.5–5.2)
Alkaline Phosphatase: 66 U/L (ref 39–117)
BUN: 43 mg/dL — ABNORMAL HIGH (ref 6–23)
CO2: 36 meq/L — ABNORMAL HIGH (ref 19–32)
Calcium: 9.6 mg/dL (ref 8.4–10.5)
Chloride: 91 meq/L — ABNORMAL LOW (ref 96–112)
Creatinine, Ser: 1.35 mg/dL — ABNORMAL HIGH (ref 0.40–1.20)
GFR: 35.95 mL/min — ABNORMAL LOW (ref >60.00)
Glucose, Bld: 91 mg/dL (ref 70–99)
Potassium: 4.5 meq/L (ref 3.5–5.1)
Sodium: 135 meq/L (ref 135–145)
Total Bilirubin: 0.6 mg/dL (ref 0.2–1.2)
Total Protein: 6.9 g/dL (ref 6.0–8.3)

## 2023-09-14 LAB — CBC
HCT: 29.7 % — ABNORMAL LOW (ref 36.0–46.0)
Hemoglobin: 9.7 g/dL — ABNORMAL LOW (ref 12.0–15.0)
MCHC: 32.7 g/dL (ref 30.0–36.0)
MCV: 108.9 fL — ABNORMAL HIGH (ref 78.0–100.0)
Platelets: 200 10*3/uL (ref 150.0–400.0)
RBC: 2.72 Mil/uL — ABNORMAL LOW (ref 3.87–5.11)
RDW: 13.5 % (ref 11.5–15.5)
WBC: 6.4 10*3/uL (ref 4.0–10.5)

## 2023-09-14 NOTE — Patient Instructions (Addendum)
We will check the labs today.    

## 2023-09-14 NOTE — Telephone Encounter (Signed)
Paula Ward called from [facility] with verbals for Congestive heart failure for:  1x a week for 6 weeks.  Best call back number and its secure to leave VM [phone]

## 2023-09-14 NOTE — Progress Notes (Unsigned)
   Subjective:   Patient ID: Paula Ward, female    DOB: 1936-02-13, 87 y.o.   MRN: [id number]  HPI The patient is an 87 YO female coming in for 2 recent hospital stays (in for fluid overload most recently and got fluid off, some decrease in blood counts during stay within her range of normal).   PMH, [facility], social history reviewed and updated, medication reconciliation done during visit from d/c  Review of Systems  Constitutional:  Positive for activity change and fatigue.  HENT: Negative.    Eyes: Negative.   Respiratory:  Negative for cough, chest tightness and shortness of breath.   Cardiovascular:  Positive for leg swelling. Negative for chest pain and palpitations.  Gastrointestinal:  Negative for abdominal distention, abdominal pain, constipation, diarrhea, nausea and vomiting.  Musculoskeletal: Negative.   Skin: Negative.   Neurological: Negative.   Psychiatric/Behavioral: Negative.      Objective:  Physical Exam Constitutional:      Appearance: She is well-developed.  HENT:     Head: Normocephalic and atraumatic.  Cardiovascular:     Rate and Rhythm: Normal rate and regular rhythm.  Pulmonary:     Effort: Pulmonary effort is normal. No respiratory distress.     Breath sounds: Normal breath sounds. No wheezing or rales.     Comments: Oxygen in place Abdominal:     General: Bowel sounds are normal. There is no distension.     Palpations: Abdomen is soft.     Tenderness: There is no abdominal tenderness. There is no rebound.  Musculoskeletal:     Cervical back: Normal range of motion.     Right lower leg: Edema present.     Left lower leg: Edema present.     Comments: 1+ edema bilateral improved from prior  Skin:    General: Skin is warm and dry.  Neurological:     Mental Status: She is alert and oriented to person, place, and time.     Coordination: Coordination normal.     Vitals:   09/14/23 1329  BP: 138/64  Pulse: 80  Temp: (!) 97.4 F (36.3 C)   TempSrc: Oral  SpO2: 99%  Weight: 103 lb (46.7 kg)  Height: 5\' 3"  (1.6 m)    Assessment & Plan:

## 2023-09-14 NOTE — Telephone Encounter (Signed)
Called pt back and went over the order gracee verbalized ok

## 2023-09-14 NOTE — Telephone Encounter (Signed)
Ok for verbals 

## 2023-09-15 ENCOUNTER — Encounter: Payer: Self-pay | Admitting: Internal Medicine

## 2023-09-15 ENCOUNTER — Other Ambulatory Visit: Payer: Self-pay | Admitting: *Deleted

## 2023-09-15 NOTE — Telephone Encounter (Signed)
Called and LVM

## 2023-09-15 NOTE — Assessment & Plan Note (Signed)
Checking CBC and CMP. She has started jardiance 10 mg daily recently and is on lasix 40 mg daily for fluid. Is on spironolactone and diltiazem as well.

## 2023-09-15 NOTE — Assessment & Plan Note (Signed)
Checking CBC and adjust as needed. She showed normal iron and signs of iron overload on bone marrow biopsy done at recent hospital stay. She is not taking iron and will monitor labs monthly.

## 2023-09-15 NOTE — Patient Outreach (Signed)
  Care Management  Transitions of Care Program Transitions of Care Post-discharge week 3/ day # 11  09/15/2023 Name: Paula Ward MRN: [id number] DOB: 1936/02/28  Subjective: Paula Ward is a 87 y.o. year old female who is a primary care patient of [name], MD. The Care Management team was unable to reach the patient by phone to assess and address transitions of care needs.   Received automated outgoing voice message x 3 attempts stating that "the person you are trying to call has a voice mail box that is full; please hang up and try your call again later;" unable to leave voice message requesting call back   Plan: Additional outreach attempts will be made to reach the patient enrolled in the [facility] Program (Post Inpatient Visit).  Caryl Pina, RN, BSN, Media planner  Transitions of Care  [facility] - [facility] [phone]: direct office

## 2023-09-15 NOTE — Assessment & Plan Note (Signed)
Checking CMP and recent addition of jardiance 10 mg daily in hospital. Adjust as needed.

## 2023-09-15 NOTE — Assessment & Plan Note (Signed)
She has stopped opsumit and is feeling like she is doing better. Continues on oxygen.

## 2023-09-16 ENCOUNTER — Ambulatory Visit: Payer: Medicare PPO | Admitting: Pulmonary Disease

## 2023-09-16 ENCOUNTER — Encounter: Payer: Self-pay | Admitting: Pulmonary Disease

## 2023-09-16 ENCOUNTER — Other Ambulatory Visit: Payer: Self-pay | Admitting: Gastroenterology

## 2023-09-16 ENCOUNTER — Telehealth: Payer: Self-pay | Admitting: *Deleted

## 2023-09-16 ENCOUNTER — Other Ambulatory Visit: Payer: Medicare PPO

## 2023-09-16 VITALS — BP 112/58 | HR 80 | Ht 63.0 in | Wt 104.0 lb

## 2023-09-16 DIAGNOSIS — J9611 Chronic respiratory failure with hypoxia: Secondary | ICD-10-CM

## 2023-09-16 DIAGNOSIS — I272 Pulmonary hypertension, unspecified: Secondary | ICD-10-CM

## 2023-09-16 NOTE — Patient Instructions (Signed)
Nice to see you  Continue all your medicines, watch your salt and take your diuretics or fluid pills.  This is the most important thing you can do for your pulmonary hypertension.  Check your oxygen to make sure it stays above 90%.  At rest you probably do not need it.  But with exertion please wear it started 3 L.  You can experiment with little bit lower if you like to but I think 3 L is okay.  Goal would be to keep it in the mid 90s when you are moving around.  The medicines we use to treat your pulmonary hypertension in the past are now probably making things worse based on changes in how your heart is functioned.  I think it is a good idea to stay off medicines for pulmonary hypertension moving forward.  Return to clinic in 3 months or sooner as needed with Dr. Judeth Horn

## 2023-09-16 NOTE — Patient Outreach (Signed)
  Care Management  Transitions of Care Program Transitions of Care Post-discharge week 3/ day # 12- unsuccessful consecutive outreach attempt # 2  09/16/2023 Name: Paula Ward MRN: [id number] DOB: [birth year]  Subjective: Paula Ward is a 87 y.o. year old female who is a primary care patient of [name], MD. The Care Management team was unable to reach the patient by phone to assess and address transitions of care needs.   Received automated outgoing voice message stating that "the person you are trying to call has a voice mail box that is full; please hang up and try your call again later;" unable to leave voice message requesting call back   Plan: Additional outreach attempts will be made to reach the patient enrolled in the [facility] Program (Post Inpatient Visit).  Caryl Pina, RN, BSN, Media planner  Transitions of Care  [facility] - [facility] [phone]: direct office

## 2023-09-16 NOTE — Progress Notes (Signed)
@Patient  ID: Paula Ward, female    DOB: Feb 28, 1936, 87 y.o.   MRN: [id number]  Chief Complaint  Patient presents with   Hospitalization Follow-up    [facility] 11/9-11/14/24   ILD    Referring provider: [name], *  HPI:   87 y.o. 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  Multiple prior pulmonary notes reviewed.  Most recent cardiology note reviewed.  Most recent [facility] note Dr. Monia Pouch reviewed.  Multiple notes from recent hospitalization including discharge summary and pulmonary consultation notes reviewed.  Patient here for evaluation of pulmonary hypertension.  Multiple hospitalizations for decompensated CHF left-sided disease, pulmonary venous hypertension.  As evidenced by serial images with pleural effusions and pulmonary edema.  Her echocardiogram over time is showing a severely dilated left atrium with valvular dysfunction as well as diastolic dysfunction.  Fortunately her RV functional serial echocardiograms look fine.  Her most recent right heart catheterization LVEDP of 18, PVR of 2 Wood units.  Her prior right heart catheterization at [facility] years prior with a PVR less than 2.  Both times on Opsumit.  Her initial cath 2001 with PVR 5.  We discussed at length the etiology of her pulmonary hypertension.  Has evolved over time.  The changing physiology of her pulmonary hypertension as a relates to her left-sided disease and valvular disease and likely worsening on pulmonary vasodilators.  She expressed understanding.  She is feeling well since discharge.  Feels better off the Opsumit.  Discussed how VQ mismatch could worsen on Opsumit given her interstitial lung disease.  Overall her hypoxemia seems improved.  She walked in the clinic today and immediate after sitting down with send saturation was 94%.  We discussed ongoing  oxygen use with exertion that she is to monitor at home as discussed below.   Questionaires / Pulmonary Flowsheets:   ACT:      No data to display          MMRC:     No data to display          Epworth:      No data to display          Tests:   FENO:  No results found for: "NITRICOXIDE"  PFT:    Latest Ref Rng & Units 08/25/2023    2:45 PM 02/03/2022   11:01 AM  PFT Results  FVC-Pre L 0.96  1.34   FVC-Predicted Pre % 42  56   FVC-Post L 0.91    FVC-Predicted Post % 40    Pre FEV1/FVC % % 79  84   Post FEV1/FCV % % 83    FEV1-Pre L 0.76  1.12   FEV1-Predicted Pre % 45  64   FEV1-Post L 0.76    DLCO uncorrected ml/min/mmHg 7.39  10.32   DLCO UNC% % 41  57   DLCO corrected ml/min/mmHg 8.46  11.49   DLCO COR %Predicted % 47  63   DLVA Predicted % 119  119   TLC L 3.02  2.88   TLC % Predicted % 61  58   RV % Predicted % 86  62   Personally reviewed and interpreted at time spirometry suggestive of severe restriction versus air trapping, no bronchodilator response.  Lung volumes consistent with severe restriction, DLCO severely reduced.  WALK:     07/15/2023    4:27  PM 03/13/2022    3:15 PM 01/06/2022   12:08 PM  SIX MIN WALK  Supplimental Oxygen during Test? (L/min) No    2 Minute Oxygen Saturation %  90 % 89 %  2 Minute HR  107 111  4 Minute Oxygen Saturation %  89 % 93 %  4 Minute HR  111 113  6 Minute Oxygen Saturation %  88 % 92 %  6 Minute HR  108 118  Tech Comments: patient walked one lap with no complaints, O2 sat 88%, patient aware to use POC 2L w/exertion/activity      Imaging: Personally reviewed and as per EMR and discussion in this note ECHOCARDIOGRAM LIMITED  Result Date: 08/31/2023    ECHOCARDIOGRAM LIMITED REPORT   Patient Name:   Paula Ward Date of Exam: 08/31/2023 Medical Rec #:  [id number]       Height:       63.0 in Accession #:    [id number]      Weight:       107.1 lb Date of Birth:  December 24, 1935       BSA:          1.483 m  Patient Age:    87 years        BP:           124/62 mmHg Patient Gender: F               HR:           85 bpm. Exam Location:  Inpatient Procedure: Limited Echo, Limited Color Doppler and Cardiac Doppler Indications:    Pericardial effusion  History:        Patient has prior history of Echocardiogram examinations, most                 recent 08/15/2023. CHF, Pericardial Disease, Pulmonary HTN,                 Aortic Valve Disease; Signs/Symptoms:Shortness of Breath.                 Scleroderma, CA, hx of PE, CREST syndrome, CKD, ILD.  Sonographer:    Milda Smart Referring Phys: [id number]  Sonographer Comments: Image acquisition challenging due to patient body habitus. IMPRESSIONS  1. Left ventricular ejection fraction, by estimation, is 60 to 65%. The left ventricle has normal function. The left ventricle has no regional wall motion abnormalities.  2. Right ventricular systolic function is normal. The right ventricular size is normal.  3. Left atrial size was mildly dilated.  4. Severe mitral annular calcification. Visually there is mitral stenosis but doppler interrogation was not done.  5. The aortic valve is tricuspid. There is severe calcifcation of the aortic valve. Visually there is aortic stenosis but doppler interrogation was not done.  6. The inferior vena cava is normal in size with <50% respiratory variability, suggesting right atrial pressure of 8 mmHg.  7. There was a large circumferential pericardial effusion. There was no RV diastolic collapse noted. Mitral E inflow velocity showed < 25% respirophasic variation. The IVC was not significantly dilated. There does not appear to be tamponade physiology.  8. Limited echo. FINDINGS  Left Ventricle: Left ventricular ejection fraction, by estimation, is 60 to 65%. The left ventricle has normal function. The left ventricle has no regional wall motion abnormalities. The left ventricular internal cavity size was normal in size. Right Ventricle:  The right ventricular size is normal. No increase in right  ventricular wall thickness. Right ventricular systolic function is normal. Left Atrium: Left atrial size was mildly dilated. Right Atrium: Right atrial size was normal in size. Pericardium: There was a large circumferential pericardial effusion. There was no RV diastolic collapse noted. Mitral E inflow velocity showed < 25% respirophasic variation. The IVC was not significantly dilated. There does not appear to be tamponade physiology. Mitral Valve: There is moderate calcification of the mitral valve leaflet(s). Severe mitral annular calcification. Aortic Valve: The aortic valve is tricuspid. There is severe calcifcation of the aortic valve. Venous: The inferior vena cava is normal in size with less than 50% respiratory variability, suggesting right atrial pressure of 8 mmHg. Additional Comments: Spectral Doppler performed. Color Doppler performed.  IVC IVC diam: 1.50 cm Dalton McleanMD Electronically signed by Wilfred Lacy Signature Date/Time: 08/31/2023/4:29:29 PM    Final    DG Chest 2 View  Result Date: 08/29/2023 CLINICAL DATA:  Shortness of breath and weight gain. On home oxygen. EXAM: CHEST - 2 VIEW COMPARISON:  08/13/2023 FINDINGS: Midline trachea. Moderate to marked cardiomegaly again with a configuration which can be seen with pericardial effusion. Small left and trace right pleural effusions, chronic. No pneumothorax. Interstitial edema is mild, improved. Improved bibasilar airspace disease. Surgical clips project over the left axilla. IVC filter. IMPRESSION: Cardiomegaly with improved mild congestive heart failure. Small, left-greater-than-right pleural effusions are similar. Adjacent presumed atelectasis at the lung bases is improved. Electronically Signed   By: Jeronimo Greaves M.D.   On: 08/29/2023 17:59    Lab Results: Personally reviewed CBC    Component Value Date/Time   WBC 6.4 09/14/2023 1406   RBC 2.72 Repeated and verified  X2. (L) 09/14/2023 1406   HGB 9.7 (L) 09/14/2023 1406   HGB 9.1 (L) 08/24/2023 1112   HGB 11.2 04/21/2022 0940   HCT 29.7 (L) 09/14/2023 1406   HCT 32.7 (L) 04/21/2022 0940   PLT 200.0 09/14/2023 1406   PLT 179 08/24/2023 1112   PLT 242 04/21/2022 0940   MCV 108.9 (H) 09/14/2023 1406   MCV 104 (H) 04/21/2022 0940   MCH 34.6 (H) 09/03/2023 0420   MCHC 32.7 09/14/2023 1406   RDW 13.5 09/14/2023 1406   RDW 13.1 04/21/2022 0940   LYMPHSABS 0.9 08/29/2023 1613   MONOABS 0.4 08/29/2023 1613   EOSABS 0.3 08/29/2023 1613   BASOSABS 0.1 08/29/2023 1613    BMET    Component Value Date/Time   NA 135 09/14/2023 1406   NA 137 08/21/2023 1512   K 4.5 09/14/2023 1406   CL 91 (L) 09/14/2023 1406   CO2 36 (H) 09/14/2023 1406   GLUCOSE 91 09/14/2023 1406   GLUCOSE 101 (H) 10/02/2006 0845   BUN 43 (H) 09/14/2023 1406   BUN 50 (H) 08/21/2023 1512   CREATININE 1.35 (H) 09/14/2023 1406   CREATININE 1.34 (H) 08/24/2023 1112   CALCIUM 9.6 09/14/2023 1406   GFRNONAA 52 (L) 09/03/2023 0420   GFRNONAA 39 (L) 08/24/2023 1112   GFRAA >60 05/24/2018 0652    BNP    Component Value Date/Time   BNP 2,168.5 (H) 08/29/2023 1613    ProBNP    Component Value Date/Time   PROBNP 2,680.0 (H) 06/03/2023 1200    Specialty Problems       Pulmonary Problems   ILD (interstitial lung disease) (HCC)    Qualifier: Diagnosis of  By: Genelle Gather CMA, [place]        Allergic rhinitis   Globus pharyngeus   Chronic respiratory failure with hypoxia (  HCC)   Chronic respiratory failure with hypoxia and hypercapnia (HCC)    HC03   06/30/23  33  - 07/15/2023   Walked on RA  x  1  lap(s) =  approx 250  ft  @ slow pace, stopped due to desats to 88% corrected on 2lpm  - 07/15/2023 increase HS to 3lpm and recheck  ONO on 3lpm       Dependence on supplemental oxygen    Allergies  Allergen Reactions   Codeine Nausea Only    Hallucinations, too   Other Nausea And Vomiting and Other (See Comments)    "-mycin"  antibiotics.   Also cause hallucinations.   Erythromycin Nausea And Vomiting   Iodinated Contrast Media Other (See Comments)    Renal issues   Lisinopril     Pt doesn't remember reaction   Mirtazapine Other (See Comments)    "Threw me into orbit"   Mycophenolate Mofetil Nausea Only   Sulfa Antibiotics Other (See Comments)    Unknown reaction    Immunization History  Administered Date(s) Administered   Fluad Quad(high Dose 65+) 07/24/2020   Influenza, High Dose Seasonal PF 07/07/2017, 06/21/2019   Influenza-Unspecified 07/15/2013, 02/16/2015, 07/19/2015, 07/18/2016, 08/20/2018, 06/24/2021, 08/03/2022   Moderna Sars-Covid-2 Vaccination 11/01/2019, 11/29/2019, 09/14/2020, 02/16/2021   Pneumococcal Conjugate-13 01/30/2014   Pneumococcal Polysaccharide-23 08/30/2007   Respiratory Syncytial Virus Vaccine,Recomb Aduvanted(Arexvy) 09/24/2022   Tdap 06/26/2011, 06/07/2018   Zoster, Live 08/15/2014    Past Medical History:  Diagnosis Date   Anemia    Angiodysplasia of stomach and duodenum    Aortic stenosis    Arthus phenomenon    AVM (arteriovenous malformation)    Blood transfusion without reported diagnosis    Breast cancer (HCC) 1989   Left   Candida esophagitis (HCC)    Cataract    Chronic respiratory failure (HCC)    CKD stage 3b, GFR 30-44 ml/min (HCC)    Corneal edema    Corneal epithelial basement membrane dystrophy    CREST syndrome (HCC)    GERD (gastroesophageal reflux disease)    w/ HH   Interstitial lung disease (HCC)    Nodule of kidney    Pericardial effusion    PONV (postoperative nausea and vomiting)    Pulmonary embolus (HCC) 2003   Pulmonary hypertension (HCC)    Rectal incontinence    Renal cell carcinoma (HCC)    Scleroderma (HCC)    Tubular adenoma of colon    Uterine polyp     Tobacco History: Social History   Tobacco Use  Smoking Status Never  Smokeless Tobacco Never   Counseling given: Not Answered   Continue to not  smoke  Outpatient Encounter Medications as of 09/16/2023  Medication Sig   acetaminophen (TYLENOL) 325 MG tablet Take 2 tablets (650 mg total) by mouth every 6 (six) hours as needed for moderate pain.   albuterol (PROVENTIL) (2.5 MG/3ML) 0.083% nebulizer solution Take 3 mLs (2.5 mg total) by nebulization every 6 (six) hours as needed for wheezing or shortness of breath.   albuterol (VENTOLIN HFA) 108 (90 Base) MCG/ACT inhaler INHALE 2 PUFFS INTO THE LUNGS EVERY 6 HOURS AS NEEDED FOR WHEEZING OR SHORTNESS OF BREATH (Patient taking differently: Inhale 2 puffs into the lungs every 6 (six) hours as needed for wheezing or shortness of breath.)   augmented betamethasone dipropionate (DIPROLENE-AF) 0.05 % cream Apply 1 application  topically 2 (two) times daily as needed (for irritation).   clobetasol ointment (TEMOVATE) 0.05 % Apply 1 Application  topically 2 (two) times daily as needed (for irritation).   diltiazem (CARDIZEM) 30 MG tablet Take 1 tablet (30 mg total) by mouth every 12 (twelve) hours.   docusate sodium (COLACE) 100 MG capsule Take 1 capsule (100 mg total) by mouth 2 (two) times daily. (Patient taking differently: Take 100 mg by mouth in the morning.)   empagliflozin (JARDIANCE) 10 MG TABS tablet Take 1 tablet (10 mg total) by mouth daily.   famotidine (PEPCID) 20 MG tablet Take 1 tablet (20 mg total) by mouth at bedtime.   fluticasone (FLONASE) 50 MCG/ACT nasal spray USE 1 SPRAY(S) IN EACH NOSTRIL ONCE DAILY AS NEEDED FOR ALLERGIES (Patient taking differently: Place 1 spray into both nostrils in the morning.)   furosemide (LASIX) 20 MG tablet Take 2 tablets (40 mg total) by mouth daily.   guaiFENesin (MUCINEX) 600 MG 12 hr tablet Take 600 mg by mouth 2 (two) times daily as needed.   ipratropium (ATROVENT) 0.03 % nasal spray Place 2 sprays into both nostrils every 12 (twelve) hours. (Patient taking differently: Place 2 sprays into both nostrils at bedtime.)   latanoprost (XALATAN) 0.005 %  ophthalmic solution Place 1 drop into both eyes at bedtime.   magic mouthwash SOLN Take 10 mLs by mouth 4 (four) times daily as needed for mouth pain.   montelukast (SINGULAIR) 10 MG tablet TAKE 1 TABLET BY MOUTH AT BEDTIME (Patient taking differently: Take 10 mg by mouth at bedtime. TAKE 1 TABLET BY MOUTH AT BEDTIME)   mupirocin ointment (BACTROBAN) 2 % Apply 1 Application topically 2 (two) times daily as needed (for irritation).   Nutritional Supplements (ENSURE HIGH PROTEIN) LIQD Take 0.5 Bottles by mouth daily as needed (for supplementation).   OXYGEN Inhale 2-3 L/min into the lungs continuous.   pantoprazole (PROTONIX) 40 MG tablet Take 1 tablet (40 mg total) by mouth daily. (Patient taking differently: Take 40 mg by mouth daily as needed (for acid reflux).)   polyethylene glycol (MIRALAX / GLYCOLAX) 17 g packet Take 17 g by mouth daily.   spironolactone (ALDACTONE) 25 MG tablet Take 0.5 tablets (12.5 mg total) by mouth daily.   sucralfate (CARAFATE) 1 GM/10ML suspension TAKE 10 MLS BY MOUTH EVERY 6 HOURS AS NEEDED FOR REFLUX (Patient taking differently: Take 1 g by mouth every 6 (six) hours as needed (to coat the stomach).)   triamcinolone cream (KENALOG) 0.1 % Apply 1 Application topically See admin instructions. Apply to affected leg(s) after showers and up to 2 additional times a day as needed for irritation   No facility-administered encounter medications on file as of 09/16/2023.     Review of Systems  Review of Systems  No chest pain with exertion.  No orthopnea or PND.  Comprehensive review of systems otherwise negative.  Pending Physical Exam  BP (!) 112/58   Pulse 80   Ht 5\' 3"  (1.6 m)   Wt 104 lb (47.2 kg)   SpO2 94% Comment: ROOM AIR  BMI 18.42 kg/m   Wt Readings from Last 5 Encounters:  09/16/23 104 lb (47.2 kg)  09/14/23 103 lb (46.7 kg)  09/09/23 105 lb 3.2 oz (47.7 kg)  09/03/23 98 lb 1.7 oz (44.5 kg)  08/24/23 107 lb 14.4 oz (48.9 kg)    BMI Readings from  Last 5 Encounters:  09/16/23 18.42 kg/m  09/14/23 18.25 kg/m  09/09/23 18.64 kg/m  09/03/23 17.38 kg/m  08/24/23 19.11 kg/m     Physical Exam General: Sitting in chair, frail, elderly Eyes: EOMI,  no icterus Neck: Supple, no JVP Pulmonary: Crackles in bilateral bases, otherwise clear, distant Cardiovascular: Warm, trace edema to above ankles bilaterally Abdomen: Nondistended, bowel sounds present MSK: No synovitis, joint effusion is noted at the MCP joints Neuro: Normal gait, no weakness Psych: Normal mood, full affect patient   Assessment & Plan:   Pulmonary hypertension: Documented on the right heart cath first diagnosed 2001.  Serial markers reviewed.  Multifactorial nature of disease primarily group 2 and 3 disease given elevated wedge pressure and mild ILD, hypoxemia.  At time of initial cath in 2001 PVR was elevated over 5 for group 1 disease likely was primary tumor back then, driven by scleroderma and CTD.  However, has a physiology of her heart has changed over time with diastolic dysfunction, aortic stenosis, mitral valve stenosis, diastolic dysfunction and development of left atrial hypertension, pulmonary venous hypertension, suspect systemic pulmonary vasodilators has led to exacerbation of left-sided congestive heart failure.  Multiple admissions for CHF with signs of pulmonary edema and pleural effusions again indicating pulmonary venous hypertension.  Her most recent catheterizations have indicated a PVR less than 3, admittedly on ERA, Opsumit.  However given left-sided disease and left-sided lower pulmonary venous hypertension, we stopped her Opsumit.  She is feeling better.  Possibly contributing to worsening VQ mismatch as well in the setting of her ILD.  Frankly, given her frailty and complex and multifactorial disease, additional pulmonary vasodilators are likely not in her best interest.  Again, she continues to feel well off of Opsumit.  Stressed importance of  low-salt diet and diuretic therapy.  She expressed understanding.  Fortunately RV function on most recent echocardiogram was excellent.  Chronic hypoxemic respiratory failure: Likely multifactorial but primarily driven by CT ILD.  Encouraged to check oxygen to keep in the mid 90s if able especially with exertion to avoid hypoxic vasoconstriction which can worsen acute pulmonary hypertension symptoms are chronically contribute to worsening pulmonary hypertension and RV failure.  Notably, she ambulated into the room and spoke in full sentences and O2 saturation was 94% on room air.  Hypoxemia seems to improve with stopping Opsumit.  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  Largely unchanged over serial images over the last several years.  No progression.  In the past has declined antifibrotic's given side effect profile.  Given her age and frailty this is reasonable.   Return in about 3 months (around 12/17/2023) for f/u Dr. Judeth Horn.   Karren Burly, MD 09/16/2023   This appointment required 45 minutes of patient care (this includes precharting, chart review, review of results, face-to-face care, etc.).

## 2023-09-18 ENCOUNTER — Telehealth: Payer: Self-pay | Admitting: *Deleted

## 2023-09-18 NOTE — Patient Outreach (Signed)
Care Management  Transitions of Care Program Transitions of Care Post-discharge week 3/ day # 14   09/18/2023 Name: Paula Ward MRN: [id number] DOB: 1935/12/29  Subjective: Paula Ward is a 87 y.o. year old female who is a primary care patient of [name], MD. The Care Management team Engaged with patient Engaged with patient by telephone to assess and address transitions of care needs.   Consent to Services:  Patient was given information about care management services, agreed to services, and gave verbal consent to participate.  Enrolled 09/04/23  Assessment: "I am still fine; I am at my Thanksgiving gathering and it has been hectic; I can't talk for very long right now, but hopefully I can talk next week.  I got the nebulizer and am using it;"           SDOH Interventions    Flowsheet Row Telephone from 09/04/2023 in [place] POPULATION HEALTH DEPARTMENT Telephone from 08/18/2023 in [facility] Community Care Coordination Telephone from 06/09/2023 in [facility] Community Care Coordination Clinical Support from 04/02/2022 in [facility] [place]  SDOH Interventions      Food Insecurity Interventions Intervention Not Indicated Intervention Not Indicated Intervention Not Indicated Intervention Not Indicated  Housing Interventions Intervention Not Indicated -- -- Intervention Not Indicated  Transportation Interventions Intervention Not Indicated  [reports has private duty transportation services through "[employer]"] Intervention Not Indicated  [drives self at baseline,  family/ friends assisting post-recent hospital discharge] Intervention Not Indicated  [normally drives self,  friends and family currently assisiting post-recent hospital discharge] Intervention Not Indicated  Utilities Interventions Intervention Not Indicated -- -- --  Surveyor, quantity Strain Interventions -- -- -- Intervention Not Indicated  Physical  Activity Interventions -- -- -- Intervention Not Indicated  Stress Interventions -- -- -- Intervention Not Indicated  Social Connections Interventions -- -- -- Intervention Not Indicated        Goals Addressed             This Visit's Progress    TOC 30-day Program Care Plan   On track    Current Barriers:  Home Health services [facility]: PT/OT- verified ordered with review of IP TOC note; contacted Alycia Rossetti- Interior and spatial designer at [phone]: he reports "problem" with way inpatient order was received: "they did not put 'evaluate and treat on order;" lengthy care coordination outreach with multiple personnel at [facility] prior to speaking with director Alycia Rossetti- Alycia Rossetti reports he will push order through as is- and we discussed possibility of adding of nursing if PCP is agreeable- Alycia Rossetti will follow up to facilitate initiating services 09/10/23: confirmed home health services through [facility] now active Equipment/DME Patient reports she is using her husband's nebulizer- states "they ordered the medicine for me at the hospital, but not a machine; they said it is okay to use my husband's nebulizer- she states that she is "not sure" if they ordered a separate mouth piece for her to use-- she has been using the same mouth piece as her husband: she was advised to stop using same-- will make pulmonary provider aware in an effort to facilitate getting patient her own mouth piece vs. own nebulizer; there is nothing related to this that I could find in review of EHR discharge instructions- I was only able to verify that new nebulizer medication was ordered at time of hospital discharge 09/10/23: re-confirmed with patient that she continues using her husband's nebulizer. Including the same nebulizer mouthpiece: have not heard back  from any of the providers I sent original message to on 09/04/23: will re-message care providers and ask for response; patient confirms she has not heard anything else about the nebulizer "from  anyone" 09/11/23: care coordination outreach to pulmonary provider office/ Dr. Judeth Horn [phone]- spoke with "Hughes Better;" explained that patient did not receive nebulizer post- hospital discharge, and that new orders need to be initiated so that patient does not have to continue using her husband's machine/ mouthpiece to take nebulizer treatments; Hughes Better states she will have Dr. Laurena Spies contact patient to take action in facilitating new order being placed- explained urgency of new order- as patient was discharged on 09/03/23 09/18/23: Patient confirms she did receive nebulizer "not too long ago" and states she is using as prescribed Fragile state of health with multiple chronic conditions; multiple hospital and ED visits, some within 30-days of one another; question palliative care vs. hospice readiness  RNCM Clinical Goal(s):  Patient will work with the Care Management team over the next 30 days to address Transition of Care Barriers: Home Health services Equipment/DME Possible need for palliative care vs. Hospice referral take all medications exactly as prescribed and will call provider for medication related questions as evidenced by patient reporting of same during [facility] 30-day program outreaches attend all scheduled medical appointments: 09/09/23- cardiology provider; 09/14/23- PCP; 09/16/23- pulmonary provider as evidenced by review of same with patient during TOC 30-day program outreaches demonstrate Ongoing adherence to prescribed treatment plan for CHF as evidenced by patient reporting during TOC 30-day program outreaches continue to work with RN Care Manager to address care management and care coordination needs related to  CHF as evidenced by adherence to CM Team Scheduled appointments work with home health team once they are established in patient's care to participate in PT and OT services as evidenced by patient reporting and collaboration as indicated with home health team not  experience hospital admission as evidenced by review of EMR. Hospital Admissions in last 6 months = 3  through collaboration with RN Care manager, provider, and care team.   Interventions: Evaluation of current treatment plan related to  self management and patient's adherence to plan as established by provider  Transitions of Care:  Goal on track:  Yes. 09/18/23 Durable Medical Equipment (DME) needs assessed with patient/caregiver Doctor Visits  - discussed the importance of doctor visits Discussed current clinical condition:  "I am still fine; I am at my Thanksgiving gathering and it has been hectic; I can't talk for very long right now, but hopefully I can talk next week.  I got the nebulizer and am using it;" denies specific clinical concerns today Reviewed recent pulmonology provider office visit on 09/16/23: she reports "the visit was good, he said he did not want to make any changes;" verified no medication changes made during visit; reinforced goal of SaO2 to be consistently in low 90's Confirmed home health team is now active and making home visits: encouraged her ongoing engagement with home health team Reinforced rationale for daily weight monitoring at home along with weight gain guidelines/ action plan for weight gain; importance of taking diuretic as prescribed  Reinforced/ provided education around safe use/ maintenance of home O2- currently continues to use between 2-3 L/min via [place], "most" of the time  Patient Goals/Self-Care Activities: Participate in Transition of Care Program/Attend TOC scheduled calls Take all medications as prescribed Attend all scheduled provider appointments Call provider office for new concerns or questions  Continue pacing activity to avoid episodes of shortness  of breath Continue to follow your established action plan for episodes of shortness of breath Continue using home oxygen as prescribed Use assistive devices as needed to prevent falls Continue  actively working with the home health team that is coming to your home  Follow Up Plan:  Telephone follow up appointment with care management team member scheduled for:  Tuesday 09/22/23 at 2:30 pm          Plan: Telephone follow up appointment with care management team member scheduled for:  Tuesday, 09/22/23 at 2:30 pm  Caryl Pina, RN, BSN, CCRN Alumnus RN Care Manager  Transitions of Care  [facility] - [facility] [phone]: direct office

## 2023-09-18 NOTE — Patient Instructions (Signed)
Visit Information  Thank you for taking time to visit with me today. Please don't hesitate to contact me if I can be of assistance to you before our next scheduled telephone appointment.  Our next appointment is by telephone on Tuesday, 09/22/23 at 2:30 pm  Please call the care guide team at [phone] if you need to cancel or reschedule your appointment.  Following are the goals we discussed today:  Patient Goals/Self-Care Activities: Participate in Transition of Care Program/Attend TOC scheduled calls Take all medications as prescribed Attend all scheduled provider appointments Call provider office for new concerns or questions  Continue pacing activity to avoid episodes of shortness of breath Continue to follow your established action plan for episodes of shortness of breath Continue using home oxygen as prescribed Use assistive devices as needed to prevent falls Continue actively working with the home health team that is coming to your home  If you are experiencing a Mental Health or Behavioral Health Crisis or need someone to talk to, please call the Suicide and Crisis Lifeline: 988 call the [place] National Suicide Prevention Lifeline: 709-374-0944 or TTY: (450)023-6980 TTY (918)030-0476) to talk to a trained counselor call 1-800-273-TALK (toll free, 24 hour hotline) go to [facility] [address] [place] [phone]) call the [facility] Crisis Line: [phone] call 911   Patient verbalizes understanding of instructions and care plan provided today and agrees to view in MyChart. Active MyChart status and patient understanding of how to access instructions and care plan via MyChart confirmed with patient.     Caryl Pina, RN, BSN, Media planner  Transitions of Care  [facility] - [facility] [phone]: direct office

## 2023-09-22 ENCOUNTER — Other Ambulatory Visit: Payer: Self-pay | Admitting: *Deleted

## 2023-09-22 DIAGNOSIS — J9612 Chronic respiratory failure with hypercapnia: Secondary | ICD-10-CM | POA: Diagnosis not present

## 2023-09-22 DIAGNOSIS — M341 CR(E)ST syndrome: Secondary | ICD-10-CM | POA: Diagnosis not present

## 2023-09-22 DIAGNOSIS — N1832 Chronic kidney disease, stage 3b: Secondary | ICD-10-CM | POA: Diagnosis not present

## 2023-09-22 DIAGNOSIS — I3139 Other pericardial effusion (noninflammatory): Secondary | ICD-10-CM | POA: Diagnosis not present

## 2023-09-22 DIAGNOSIS — M349 Systemic sclerosis, unspecified: Secondary | ICD-10-CM | POA: Diagnosis not present

## 2023-09-22 DIAGNOSIS — D631 Anemia in chronic kidney disease: Secondary | ICD-10-CM | POA: Diagnosis not present

## 2023-09-22 DIAGNOSIS — J9611 Chronic respiratory failure with hypoxia: Secondary | ICD-10-CM | POA: Diagnosis not present

## 2023-09-22 DIAGNOSIS — I130 Hypertensive heart and chronic kidney disease with heart failure and stage 1 through stage 4 chronic kidney disease, or unspecified chronic kidney disease: Secondary | ICD-10-CM | POA: Diagnosis not present

## 2023-09-22 DIAGNOSIS — I5033 Acute on chronic diastolic (congestive) heart failure: Secondary | ICD-10-CM | POA: Diagnosis not present

## 2023-09-22 NOTE — Patient Instructions (Signed)
Visit Information  Thank you for taking time to visit with me today. Please don't hesitate to contact me if I can be of assistance to you before our next scheduled telephone appointment.  Our next appointment is by telephone on Tuesday 09/29/23 at 3:15 pm  Please call the care guide team at [phone] if you need to cancel or reschedule your appointment.   Following are the goals we discussed today:  Patient Goals/Self-Care Activities: Participate in Transition of Care Program/Attend TOC scheduled calls Take all medications as prescribed Attend all scheduled provider appointments Call provider office for new concerns or questions  Continue pacing activity to avoid episodes of shortness of breath Continue to follow your established action plan for episodes of shortness of breath Continue using home oxygen as prescribed Use assistive devices as needed to prevent falls Continue actively working with the home health team that is coming to your home  If you are experiencing a Mental Health or Behavioral Health Crisis or need someone to talk to, please  call the Suicide and Crisis Lifeline: 988 call the [place] National Suicide Prevention Lifeline: 754-827-7228 or TTY: 956-044-5245 TTY 201-835-6263) to talk to a trained counselor call 1-800-273-TALK (toll free, 24 hour hotline) go to [facility] [address], [place] [phone]) call the [facility] Crisis Line: [phone] call 911   Patient verbalizes understanding of instructions and care plan provided today and agrees to view in MyChart. Active MyChart status and patient understanding of how to access instructions and care plan via MyChart confirmed with patient.     Caryl Pina, RN, BSN, Media planner  Transitions of Care  [facility] - [facility] [phone]: direct office

## 2023-09-22 NOTE — Patient Outreach (Signed)
Care Management  Transitions of Care Program Transitions of Care Post-discharge week 4/ day # 18   09/22/2023 Name: Paula Ward MRN: [id number] DOB: [birth year]  Subjective: Paula Ward is a 87 y.o. year old female who is a primary care patient of [name], MD. The Care Management team Engaged with patient Engaged with patient by telephone to assess and address transitions of care needs.   Consent to Services:  Patient was given information about care management services, agreed to services, and gave verbal consent to participate.   Enrolled 09/04/23  Assessment: "I am still doing fine; I am so much better since they stopped giving me that Optisumit drug-- it should have been stopped a long time ago.  I got the nebulizer and am using it;" denies specific clinical concerns today          SDOH Interventions    Flowsheet Row Telephone from 09/04/2023 in [place] POPULATION HEALTH DEPARTMENT Telephone from 08/18/2023 in [facility] Community Care Coordination Telephone from 06/09/2023 in [facility] Community Care Coordination Clinical Support from 04/02/2022 in [facility] [place]  SDOH Interventions      Food Insecurity Interventions Intervention Not Indicated Intervention Not Indicated Intervention Not Indicated Intervention Not Indicated  Housing Interventions Intervention Not Indicated -- -- Intervention Not Indicated  Transportation Interventions Intervention Not Indicated  [reports has private duty transportation services through "[employer]"] Intervention Not Indicated  [drives self at baseline,  family/ friends assisting post-recent hospital discharge] Intervention Not Indicated  [normally drives self,  friends and family currently assisiting post-recent hospital discharge] Intervention Not Indicated  Utilities Interventions Intervention Not Indicated -- -- --  Surveyor, quantity Strain Interventions -- -- --  Intervention Not Indicated  Physical Activity Interventions -- -- -- Intervention Not Indicated  Stress Interventions -- -- -- Intervention Not Indicated  Social Connections Interventions -- -- -- Intervention Not Indicated        Goals Addressed             This Visit's Progress    TOC 30-day Program Care Plan   On track    Current Barriers:  Home Health services [facility]: PT/OT- verified ordered with review of IP TOC note; contacted Alycia Rossetti- Interior and spatial designer at [phone]: he reports "problem" with way inpatient order was received: "they did not put 'evaluate and treat on order;" lengthy care coordination outreach with multiple personnel at [facility] prior to speaking with director Alycia Rossetti- Alycia Rossetti reports he will push order through as is- and we discussed possibility of adding of nursing if PCP is agreeable- Alycia Rossetti will follow up to facilitate initiating services 09/10/23: confirmed home health services through [facility] now active 09/22/23: today, patient tells me that [facility] PT "just started today;" states they had contacted her before-- but they were waiting for insurance approval; states 'they just now got it and now they are coming out; initial visit today; reports she was told by [facility] that she has "6 weeks of home PT" Equipment/DME Patient reports she is using her husband's nebulizer- states "they ordered the medicine for me at the hospital, but not a machine; they said it is okay to use my husband's nebulizer- she states that she is "not sure" if they ordered a separate mouth piece for her to use-- she has been using the same mouth piece as her husband: she was advised to stop using same-- will make pulmonary provider aware in an effort to facilitate getting patient her own mouth  piece vs. own nebulizer; there is nothing related to this that I could find in review of EHR discharge instructions- I was only able to verify that new nebulizer medication was ordered at time of  hospital discharge 09/10/23: re-confirmed with patient that she continues using her husband's nebulizer. Including the same nebulizer mouthpiece: have not heard back from any of the providers I sent original message to on 09/04/23: will re-message care providers and ask for response; patient confirms she has not heard anything else about the nebulizer "from anyone" 09/11/23: care coordination outreach to pulmonary provider office/ Dr. Judeth Horn [phone]- spoke with "Hughes Better;" explained that patient did not receive nebulizer post- hospital discharge, and that new orders need to be initiated so that patient does not have to continue using her husband's machine/ mouthpiece to take nebulizer treatments; Hughes Better states she will have Dr. Laurena Spies contact patient to take action in facilitating new order being placed- explained urgency of new order- as patient was discharged on 09/03/23 09/18/23: Patient confirms she did receive nebulizer "not too long ago" and states she is using as prescribed 09/22/23: Re-confirmed that patient continues to use her own nebulizer, states she is using "once or twice a week" Fragile state of health with multiple chronic conditions; multiple hospital and ED visits, some within 30-days of one another; question palliative care vs. hospice readiness  RNCM Clinical Goal(s):  Patient will work with the Care Management team over the next 30 days to address Transition of Care Barriers: Home Health services Equipment/DME Possible need for palliative care vs. Hospice referral take all medications exactly as prescribed and will call provider for medication related questions as evidenced by patient reporting of same during [facility] 30-day program outreaches attend all scheduled medical appointments: 09/30/23: podiatry provider as evidenced by review of same with patient during TOC 30-day program outreaches demonstrate Ongoing adherence to prescribed treatment plan for CHF as evidenced by  patient reporting during TOC 30-day program outreaches continue to work with RN Care Manager to address care management and care coordination needs related to  CHF as evidenced by adherence to CM Team Scheduled appointments work with home health team once they are established in patient's care to participate in PT and OT services as evidenced by patient reporting and collaboration as indicated with home health team not experience hospital admission as evidenced by review of EMR. Hospital Admissions in last 6 months = 3  through collaboration with RN Care manager, provider, and care team.   Interventions: Evaluation of current treatment plan related to  self management and patient's adherence to plan as established by provider  Transitions of Care:  Goal on track:  Yes. 09/22/23 Durable Medical Equipment (DME) needs assessed with patient/caregiver Doctor Visits  - discussed the importance of doctor visits Discussed current clinical condition:  "I am still doing fine; I am so much better since they stopped giving me that Optisumit drug-- it should have been stopped a long time ago.  I got the nebulizer and am using it;" denies specific clinical concerns today Verified no recent medication changes;  full medication reconciliation/ review completed; continues to self-manage medications and denies questions/ concerns around medications today; reinforced goal of SaO2 to be consistently in low 90's Reinforced rationale for daily weight monitoring at home along with weight gain guidelines/ action plan for weight gain; importance of taking diuretic as prescribed  Reports her weight this morning was "103.1 lbs;" states she weighed yesterday and it was "101.7 lbs;" stated she proactively took an additional  10 mg Lasix, as per pulmonary provider instructions: she verbalizes good general understanding around rationale for daily weight monitoring at home along with weight gain guidelines/ action plan for weight  gain; importance of taking diuretic as prescribed  Reinforced/ provided education around safe use/ maintenance of home O2- currently continues to use between 2-3 L/min via [place], now reports using "at night when I sleep, and only if I need it during the day"  Reinforced previously provided education around importance of following good infection control practices, including not using her husband's nebulizer machine/ medicine/ or mouthpiece  Patient Goals/Self-Care Activities: Participate in Transition of Care Program/Attend TOC scheduled calls Take all medications as prescribed Attend all scheduled provider appointments Call provider office for new concerns or questions  Continue pacing activity to avoid episodes of shortness of breath Continue to follow your established action plan for episodes of shortness of breath Continue using home oxygen as prescribed Use assistive devices as needed to prevent falls Continue actively working with the home health team that is coming to your home  Follow Up Plan:  Telephone follow up appointment with care management team member scheduled for:  Tuesday 09/29/23 at 3:15 pm          Plan: Telephone follow up appointment with care management team member scheduled for:  Tuesday 09/29/23 at 3:15 pm  Caryl Pina, RN, BSN, CCRN Alumnus RN Care Manager  Transitions of Care  [facility] - Population Health  [place] [phone]: direct office

## 2023-09-24 ENCOUNTER — Other Ambulatory Visit: Payer: Self-pay | Admitting: Gastroenterology

## 2023-09-24 DIAGNOSIS — J9611 Chronic respiratory failure with hypoxia: Secondary | ICD-10-CM | POA: Diagnosis not present

## 2023-09-24 DIAGNOSIS — J9612 Chronic respiratory failure with hypercapnia: Secondary | ICD-10-CM | POA: Diagnosis not present

## 2023-09-24 DIAGNOSIS — D631 Anemia in chronic kidney disease: Secondary | ICD-10-CM | POA: Diagnosis not present

## 2023-09-24 DIAGNOSIS — I130 Hypertensive heart and chronic kidney disease with heart failure and stage 1 through stage 4 chronic kidney disease, or unspecified chronic kidney disease: Secondary | ICD-10-CM | POA: Diagnosis not present

## 2023-09-24 DIAGNOSIS — I5033 Acute on chronic diastolic (congestive) heart failure: Secondary | ICD-10-CM | POA: Diagnosis not present

## 2023-09-24 DIAGNOSIS — M349 Systemic sclerosis, unspecified: Secondary | ICD-10-CM | POA: Diagnosis not present

## 2023-09-24 DIAGNOSIS — I3139 Other pericardial effusion (noninflammatory): Secondary | ICD-10-CM | POA: Diagnosis not present

## 2023-09-24 DIAGNOSIS — M341 CR(E)ST syndrome: Secondary | ICD-10-CM | POA: Diagnosis not present

## 2023-09-24 DIAGNOSIS — N1832 Chronic kidney disease, stage 3b: Secondary | ICD-10-CM | POA: Diagnosis not present

## 2023-09-25 ENCOUNTER — Other Ambulatory Visit: Payer: Medicare PPO

## 2023-09-25 DIAGNOSIS — N1832 Chronic kidney disease, stage 3b: Secondary | ICD-10-CM | POA: Diagnosis not present

## 2023-09-28 DIAGNOSIS — M341 CR(E)ST syndrome: Secondary | ICD-10-CM | POA: Diagnosis not present

## 2023-09-28 DIAGNOSIS — D631 Anemia in chronic kidney disease: Secondary | ICD-10-CM | POA: Diagnosis not present

## 2023-09-28 DIAGNOSIS — I5033 Acute on chronic diastolic (congestive) heart failure: Secondary | ICD-10-CM | POA: Diagnosis not present

## 2023-09-28 DIAGNOSIS — I3139 Other pericardial effusion (noninflammatory): Secondary | ICD-10-CM | POA: Diagnosis not present

## 2023-09-28 DIAGNOSIS — J9611 Chronic respiratory failure with hypoxia: Secondary | ICD-10-CM | POA: Diagnosis not present

## 2023-09-28 DIAGNOSIS — N1832 Chronic kidney disease, stage 3b: Secondary | ICD-10-CM | POA: Diagnosis not present

## 2023-09-28 DIAGNOSIS — I130 Hypertensive heart and chronic kidney disease with heart failure and stage 1 through stage 4 chronic kidney disease, or unspecified chronic kidney disease: Secondary | ICD-10-CM | POA: Diagnosis not present

## 2023-09-28 DIAGNOSIS — J9612 Chronic respiratory failure with hypercapnia: Secondary | ICD-10-CM | POA: Diagnosis not present

## 2023-09-28 DIAGNOSIS — M349 Systemic sclerosis, unspecified: Secondary | ICD-10-CM | POA: Diagnosis not present

## 2023-09-29 ENCOUNTER — Other Ambulatory Visit: Payer: Self-pay | Admitting: *Deleted

## 2023-09-29 DIAGNOSIS — D631 Anemia in chronic kidney disease: Secondary | ICD-10-CM | POA: Diagnosis not present

## 2023-09-29 DIAGNOSIS — N1832 Chronic kidney disease, stage 3b: Secondary | ICD-10-CM | POA: Diagnosis not present

## 2023-09-29 DIAGNOSIS — R809 Proteinuria, unspecified: Secondary | ICD-10-CM | POA: Diagnosis not present

## 2023-09-29 DIAGNOSIS — M349 Systemic sclerosis, unspecified: Secondary | ICD-10-CM | POA: Diagnosis not present

## 2023-09-29 DIAGNOSIS — Z85528 Personal history of other malignant neoplasm of kidney: Secondary | ICD-10-CM | POA: Diagnosis not present

## 2023-09-29 DIAGNOSIS — I3139 Other pericardial effusion (noninflammatory): Secondary | ICD-10-CM | POA: Diagnosis not present

## 2023-09-29 DIAGNOSIS — I130 Hypertensive heart and chronic kidney disease with heart failure and stage 1 through stage 4 chronic kidney disease, or unspecified chronic kidney disease: Secondary | ICD-10-CM | POA: Diagnosis not present

## 2023-09-29 DIAGNOSIS — I129 Hypertensive chronic kidney disease with stage 1 through stage 4 chronic kidney disease, or unspecified chronic kidney disease: Secondary | ICD-10-CM | POA: Diagnosis not present

## 2023-09-29 DIAGNOSIS — I5033 Acute on chronic diastolic (congestive) heart failure: Secondary | ICD-10-CM | POA: Diagnosis not present

## 2023-09-29 DIAGNOSIS — D638 Anemia in other chronic diseases classified elsewhere: Secondary | ICD-10-CM | POA: Diagnosis not present

## 2023-09-29 DIAGNOSIS — J9612 Chronic respiratory failure with hypercapnia: Secondary | ICD-10-CM | POA: Diagnosis not present

## 2023-09-29 DIAGNOSIS — J9611 Chronic respiratory failure with hypoxia: Secondary | ICD-10-CM | POA: Diagnosis not present

## 2023-09-29 DIAGNOSIS — M341 CR(E)ST syndrome: Secondary | ICD-10-CM | POA: Diagnosis not present

## 2023-09-29 DIAGNOSIS — I272 Pulmonary hypertension, unspecified: Secondary | ICD-10-CM | POA: Diagnosis not present

## 2023-09-29 NOTE — Patient Outreach (Signed)
Care Management  Transitions of Care Program Transitions of Care Post-discharge week # 5/ day # 25   09/29/2023 Name: Paula Ward MRN: [id number] DOB: [birth year]  Subjective: Paula Ward is a 87 y.o. year old female who is a primary care patient of [name], MD. The Care Management team Engaged with patient Engaged with patient by telephone to assess and address transitions of care needs.   Consent to Services:  Patient was given information about care management services, agreed to services, and gave verbal consent to participate.  Enrolled 09/04/23  Assessment: "I am still doing just fine; I am so much better since they stopped giving me that Optisumit drug-- it should have been stopped a long time ago, that was the whole problem from the beginning.  Now, I feel much better and don't have any tightness in my chest at all and am breathing fine.  Not having to use the nebulizer very much and only using the oxygen when I feel like I need it.  The home health PT are coming out and they just arrived at my house now;"            SDOH Interventions    Flowsheet Row Telephone from 09/04/2023 in [place] POPULATION HEALTH DEPARTMENT Telephone from 08/18/2023 in [facility] Community Care Coordination Telephone from 06/09/2023 in [facility] Community Care Coordination Clinical Support from 04/02/2022 in [facility] [place]  SDOH Interventions      Food Insecurity Interventions Intervention Not Indicated Intervention Not Indicated Intervention Not Indicated Intervention Not Indicated  Housing Interventions Intervention Not Indicated -- -- Intervention Not Indicated  Transportation Interventions Intervention Not Indicated  [reports has private duty transportation services through "[employer]"] Intervention Not Indicated  [drives self at baseline,  family/ friends assisting post-recent hospital discharge] Intervention Not  Indicated  [normally drives self,  friends and family currently assisiting post-recent hospital discharge] Intervention Not Indicated  Utilities Interventions Intervention Not Indicated -- -- --  Surveyor, quantity Strain Interventions -- -- -- Intervention Not Indicated  Physical Activity Interventions -- -- -- Intervention Not Indicated  Stress Interventions -- -- -- Intervention Not Indicated  Social Connections Interventions -- -- -- Intervention Not Indicated        Goals Addressed             This Visit's Progress    TOC 30-day Program Care Plan   On track    Current Barriers:  Home Health services [facility]: PT/OT- verified ordered with review of IP TOC note; contacted Alycia Rossetti- Interior and spatial designer at [phone]: he reports "problem" with way inpatient order was received: "they did not put 'evaluate and treat on order;" lengthy care coordination outreach with multiple personnel at [facility] prior to speaking with director Alycia Rossetti- Alycia Rossetti reports he will push order through as is- and we discussed possibility of adding of nursing if PCP is agreeable- Alycia Rossetti will follow up to facilitate initiating services 09/10/23: confirmed home health services through [facility] now active 09/22/23: today, patient tells me that [facility] PT "just started today;" states they had contacted her before-- but they were waiting for insurance approval; states 'they just now got it and now they are coming out; initial visit today; reports she was told by [facility] that she has "6 weeks of home PT" 09/29/23: confirms home health services now active-- home health visit in progress during TOC call today Equipment/DME Patient reports she is using her husband's nebulizer- states "they ordered the medicine  for me at the hospital, but not a machine; they said it is okay to use my husband's nebulizer- she states that she is "not sure" if they ordered a separate mouth piece for her to use-- she has been using the same mouth piece as  her husband: she was advised to stop using same-- will make pulmonary provider aware in an effort to facilitate getting patient her own mouth piece vs. own nebulizer; there is nothing related to this that I could find in review of EHR discharge instructions- I was only able to verify that new nebulizer medication was ordered at time of hospital discharge 09/10/23: re-confirmed with patient that she continues using her husband's nebulizer. Including the same nebulizer mouthpiece: have not heard back from any of the providers I sent original message to on 09/04/23: will re-message care providers and ask for response; patient confirms she has not heard anything else about the nebulizer "from anyone" 09/11/23: care coordination outreach to pulmonary provider office/ Dr. Judeth Horn [phone]- spoke with "Hughes Better;" explained that patient did not receive nebulizer post- hospital discharge, and that new orders need to be initiated so that patient does not have to continue using her husband's machine/ mouthpiece to take nebulizer treatments; Hughes Better states she will have Dr. Laurena Spies contact patient to take action in facilitating new order being placed- explained urgency of new order- as patient was discharged on 09/03/23 09/18/23: Patient confirms she did receive nebulizer "not too long ago" and states she is using as prescribed 09/22/23 and 10/09/23: Re-confirmed that patient continues to use her own nebulizer, no longer using her husband's machine; states she is using "once or twice a week" Fragile state of health with multiple chronic conditions; multiple hospital and ED visits, some within 30-days of one another; question palliative care vs. hospice readiness  RNCM Clinical Goal(s):  Patient will work with the Care Management team over the next 30 days to address Transition of Care Barriers: Home Health services Equipment/DME Possible need for palliative care vs. Hospice referral take all medications  exactly as prescribed and will call provider for medication related questions as evidenced by patient reporting of same during [facility] 30-day program outreaches attend all scheduled medical appointments: 09/30/23: podiatry provider as evidenced by review of same with patient during TOC 30-day program outreaches demonstrate Ongoing adherence to prescribed treatment plan for CHF as evidenced by patient reporting during TOC 30-day program outreaches continue to work with RN Care Manager to address care management and care coordination needs related to  CHF as evidenced by adherence to CM Team Scheduled appointments work with home health team to participate in PT and OT services as evidenced by patient reporting and collaboration as indicated with home health team not experience hospital admission as evidenced by review of EMR. Hospital Admissions in last 6 months = 3  through collaboration with RN Care manager, provider, and care team.   Interventions: Evaluation of current treatment plan related to  self management and patient's adherence to plan as established by provider  Transitions of Care:  Goal on track:  Yes. 09/29/23 Durable Medical Equipment (DME) needs assessed with patient/caregiver Doctor Visits  - discussed the importance of doctor visits Discussed current clinical condition:  "I am still doing just fine; I am so much better since they stopped giving me that Optisumit drug-- it should have been stopped a long time ago, that was the whole problem from the beginning.  Now, I feel much better and don't have any tightness in my chest  at all and am breathing fine.  Not having to use the nebulizer very much and only using the oxygen when I feel like I need it.  The home health PT are coming out and they just arrived at my house now;"  denies specific clinical concerns today Verified no recent medication changes;  continues to self-manage medications and denies questions/ concerns around medications  today; reinforced goal of SaO2 to be consistently in low 90's Reviewed upcoming provider appointments-- 09/30/23: podiatry for orthotic castings- she verbalizes awareness/ plans to attend as scheduled Reinforced rationale for daily weight monitoring at home along with weight gain guidelines/ action plan for weight gain; importance of taking diuretic as prescribed  Reports her weight this morning was "102 lbs;" states she has not needed to take additional Lasix this week, as per pulmonary provider instructions: she verbalizes good general understanding of when to take  Patient Goals/Self-Care Activities: Participate in Transition of Care Program/Attend TOC scheduled calls Take all medications as prescribed Attend all scheduled provider appointments Call provider office for new concerns or questions  Continue pacing activity to avoid episodes of shortness of breath Continue to follow your established action plan for episodes of shortness of breath Continue using home oxygen as prescribed Use assistive devices as needed to prevent falls Continue actively working with the home health team that is coming to your home  Follow Up Plan:  Telephone follow up appointment with care management team member scheduled for:  Monday 10/05/23 at 2:00 pm          Plan: Telephone follow up appointment with care management team member scheduled for:  Monday 10/05/23 at 2:00 pm  Caryl Pina, RN, BSN, Media planner  Transitions of Care  [facility] [phone]: direct office

## 2023-09-29 NOTE — Patient Instructions (Signed)
Visit Information  Thank you for taking time to visit with me today. Please don't hesitate to contact me if I can be of assistance to you before our next scheduled telephone appointment.  Our next appointment is by telephone on Monday 10/05/23 at 2:00 pm  Please call the care guide team at [phone] if you need to cancel or reschedule your appointment.   Following are the goals we discussed today:  Patient Goals/Self-Care Activities: Participate in Transition of Care Program/Attend TOC scheduled calls Take all medications as prescribed Attend all scheduled provider appointments Call provider office for new concerns or questions  Continue pacing activity to avoid episodes of shortness of breath Continue to follow your established action plan for episodes of shortness of breath Continue using home oxygen as prescribed Use assistive devices as needed to prevent falls Continue actively working with the home health team that is coming to your home   If you are experiencing a Mental Health or Behavioral Health Crisis or need someone to talk to, please  call the Suicide and Crisis Lifeline: 988 call the [place] National Suicide Prevention Lifeline: 650-294-1364 or TTY: 442-517-2048 TTY (813) 814-4449) to talk to a trained counselor call 1-800-273-TALK (toll free, 24 hour hotline) go to [facility] [address], [place] [phone]) call the [facility] Crisis Line: [phone] call 911   Patient verbalizes understanding of instructions and care plan provided today and agrees to view in MyChart. Active MyChart status and patient understanding of how to access instructions and care plan via MyChart confirmed with patient.     Caryl Pina, RN, BSN, Media planner  Transitions of Care  [facility] - [facility] [phone]: direct office

## 2023-09-30 ENCOUNTER — Ambulatory Visit: Payer: Medicare PPO

## 2023-10-01 DIAGNOSIS — J9612 Chronic respiratory failure with hypercapnia: Secondary | ICD-10-CM | POA: Diagnosis not present

## 2023-10-01 DIAGNOSIS — N1832 Chronic kidney disease, stage 3b: Secondary | ICD-10-CM | POA: Diagnosis not present

## 2023-10-01 DIAGNOSIS — M341 CR(E)ST syndrome: Secondary | ICD-10-CM | POA: Diagnosis not present

## 2023-10-01 DIAGNOSIS — D631 Anemia in chronic kidney disease: Secondary | ICD-10-CM | POA: Diagnosis not present

## 2023-10-01 DIAGNOSIS — M349 Systemic sclerosis, unspecified: Secondary | ICD-10-CM | POA: Diagnosis not present

## 2023-10-01 DIAGNOSIS — I3139 Other pericardial effusion (noninflammatory): Secondary | ICD-10-CM | POA: Diagnosis not present

## 2023-10-01 DIAGNOSIS — I5033 Acute on chronic diastolic (congestive) heart failure: Secondary | ICD-10-CM | POA: Diagnosis not present

## 2023-10-01 DIAGNOSIS — I130 Hypertensive heart and chronic kidney disease with heart failure and stage 1 through stage 4 chronic kidney disease, or unspecified chronic kidney disease: Secondary | ICD-10-CM | POA: Diagnosis not present

## 2023-10-01 DIAGNOSIS — J9611 Chronic respiratory failure with hypoxia: Secondary | ICD-10-CM | POA: Diagnosis not present

## 2023-10-02 DIAGNOSIS — I509 Heart failure, unspecified: Secondary | ICD-10-CM | POA: Diagnosis not present

## 2023-10-05 ENCOUNTER — Telehealth: Payer: Self-pay | Admitting: Hematology and Oncology

## 2023-10-05 ENCOUNTER — Other Ambulatory Visit: Payer: Self-pay | Admitting: *Deleted

## 2023-10-05 DIAGNOSIS — M341 CR(E)ST syndrome: Secondary | ICD-10-CM | POA: Diagnosis not present

## 2023-10-05 DIAGNOSIS — M349 Systemic sclerosis, unspecified: Secondary | ICD-10-CM | POA: Diagnosis not present

## 2023-10-05 DIAGNOSIS — I509 Heart failure, unspecified: Secondary | ICD-10-CM | POA: Diagnosis not present

## 2023-10-05 DIAGNOSIS — I5033 Acute on chronic diastolic (congestive) heart failure: Secondary | ICD-10-CM | POA: Diagnosis not present

## 2023-10-05 DIAGNOSIS — J9611 Chronic respiratory failure with hypoxia: Secondary | ICD-10-CM | POA: Diagnosis not present

## 2023-10-05 DIAGNOSIS — J9612 Chronic respiratory failure with hypercapnia: Secondary | ICD-10-CM | POA: Diagnosis not present

## 2023-10-05 DIAGNOSIS — I3139 Other pericardial effusion (noninflammatory): Secondary | ICD-10-CM | POA: Diagnosis not present

## 2023-10-05 DIAGNOSIS — D631 Anemia in chronic kidney disease: Secondary | ICD-10-CM | POA: Diagnosis not present

## 2023-10-05 DIAGNOSIS — I130 Hypertensive heart and chronic kidney disease with heart failure and stage 1 through stage 4 chronic kidney disease, or unspecified chronic kidney disease: Secondary | ICD-10-CM | POA: Diagnosis not present

## 2023-10-05 DIAGNOSIS — N1832 Chronic kidney disease, stage 3b: Secondary | ICD-10-CM | POA: Diagnosis not present

## 2023-10-05 NOTE — Patient Outreach (Signed)
Care Management  Transitions of Care Program Transitions of Care Post-discharge week # 6/ day # 31- TOC 30-day program case closure   10/05/2023 Name: Paula Ward MRN: [id number] DOB: [birth year]  Subjective: Paula Ward is a 87 y.o. year old female who is a primary care patient of [name], MD. The Care Management team Engaged with patient Engaged with patient by telephone to assess and address transitions of care needs.   Consent to Services:  Patient was given information about care management services, agreed to services, and gave verbal consent to participate.  Enrolled 09/04/23; TOC 30-day program case closure on 10/05/23  Assessment: "I am still doing just fine; not having any problems....  since they stopped giving me that Optisumit drug-- I am not having any problems at all.  I haven't even needed to use the nebulizer recently.  No changes with my oxygen-- I just use it when I need to-- but it's not as much as I used to need it when I was on that medicine.  We still have the people from [employer] coming in on Tuesday's and Thursday's to help [place] with whatever we need.  Things are going well;"   10/05/23: No further follow up required: Patient participated in TOC-30 day program and has met her previously established goals without hospital re-admission; scheduled today for ongoing care management outreach with longitudinal RN CM on Monday 10/19/23 at 10:00 am: encouraged patient's ongoing engagement with RN CM           SDOH Interventions    Flowsheet Row Telephone from 09/04/2023 in [place] POPULATION HEALTH DEPARTMENT Telephone from 08/18/2023 in [facility] Community Care Coordination Telephone from 06/09/2023 in [facility] Community Care Coordination Clinical Support from 04/02/2022 in [facility] [place]  SDOH Interventions      Food Insecurity Interventions Intervention Not Indicated Intervention  Not Indicated Intervention Not Indicated Intervention Not Indicated  Housing Interventions Intervention Not Indicated -- -- Intervention Not Indicated  Transportation Interventions Intervention Not Indicated  [reports has private duty transportation services through "[employer]"] Intervention Not Indicated  [drives self at baseline,  family/ friends assisting post-recent hospital discharge] Intervention Not Indicated  [normally drives self,  friends and family currently assisiting post-recent hospital discharge] Intervention Not Indicated  Utilities Interventions Intervention Not Indicated -- -- --  Surveyor, quantity Strain Interventions -- -- -- Intervention Not Indicated  Physical Activity Interventions -- -- -- Intervention Not Indicated  Stress Interventions -- -- -- Intervention Not Indicated  Social Connections Interventions -- -- -- Intervention Not Indicated        Goals Addressed             This Visit's Progress    TOC 30-day Program Care Plan   On track    Current Barriers:  Home Health services [facility]: PT/OT- verified ordered with review of IP TOC note; contacted Alycia Rossetti- Interior and spatial designer at [phone]: he reports "problem" with way inpatient order was received: "they did not put 'evaluate and treat on order;" lengthy care coordination outreach with multiple personnel at [facility] prior to speaking with director Alycia Rossetti- Alycia Rossetti reports he will push order through as is- and we discussed possibility of adding of nursing if PCP is agreeable- Alycia Rossetti will follow up to facilitate initiating services 09/10/23: confirmed home health services through [facility] now active 09/22/23: today, patient tells me that [facility] PT "just started today;" states they had contacted her before-- but they were waiting for  insurance approval; states 'they just now got it and now they are coming out; initial visit today; reports she was told by [facility] that she has "6 weeks of home PT" 09/29/23: confirms  home health services now active-- home health visit in progress during TOC call today 10/05/23: re-confirmed home health PT remains active:  patient reports last visit "earlier today;" and states "they said I did good with the exercises" Equipment/DME Patient reports she is using her husband's nebulizer- states "they ordered the medicine for me at the hospital, but not a machine; they said it is okay to use my husband's nebulizer- she states that she is "not sure" if they ordered a separate mouth piece for her to use-- she has been using the same mouth piece as her husband: she was advised to stop using same-- will make pulmonary provider aware in an effort to facilitate getting patient her own mouth piece vs. own nebulizer; there is nothing related to this that I could find in review of EHR discharge instructions- I was only able to verify that new nebulizer medication was ordered at time of hospital discharge 09/10/23: re-confirmed with patient that she continues using her husband's nebulizer. Including the same nebulizer mouthpiece: have not heard back from any of the providers I sent original message to on 09/04/23: will re-message care providers and ask for response; patient confirms she has not heard anything else about the nebulizer "from anyone" 09/11/23: care coordination outreach to pulmonary provider office/ Dr. Judeth Horn [phone]- spoke with "Hughes Better;" explained that patient did not receive nebulizer post- hospital discharge, and that new orders need to be initiated so that patient does not have to continue using her husband's machine/ mouthpiece to take nebulizer treatments; Hughes Better states she will have Dr. Laurena Spies contact patient to take action in facilitating new order being placed- explained urgency of new order- as patient was discharged on 09/03/23 09/18/23: Patient confirms she did receive nebulizer "not too long ago" and states she is using as prescribed 09/22/23; 10/09/23;  10/05/23: Re-confirmed that patient continues to use her own nebulizer, no longer using her husband's machine; states on 10/05/23: she "has not needed recently; breathing fine" Fragile state of health with multiple chronic conditions; multiple hospital and ED visits, some within 30-days of one another; question palliative care vs. hospice readiness  RNCM Clinical Goal(s):  Patient will work with the Care Management team over the next 30 days to address Transition of Care Barriers: Home Health services Equipment/DME Possible need for palliative care vs. Hospice referral take all medications exactly as prescribed and will call provider for medication related questions as evidenced by patient reporting of same during [facility] 30-day program outreaches attend all scheduled medical appointments: 10/09/23: oncology provider; 09/30/23: podiatry provider as evidenced by review of same with patient during [facility] 30-day program outreaches demonstrate Ongoing adherence to prescribed treatment plan for CHF as evidenced by patient reporting during TOC 30-day program outreaches continue to work with RN Care Manager to address care management and care coordination needs related to  CHF as evidenced by adherence to CM Team Scheduled appointments work with home health team to participate in PT and OT services as evidenced by patient reporting and collaboration as indicated with home health team not experience hospital admission as evidenced by review of EMR. Hospital Admissions in last 6 months = 3  through collaboration with RN Care manager, provider, and care team.   10/05/23: No further follow up required: Patient participated in TOC-30 day program and has met  her previously established goals without hospital re-admission; scheduled today for ongoing care management outreach with longitudinal RN CM on Monday 10/19/23 at 10:00 am: encouraged patient's ongoing engagement with RN CM  Interventions: Evaluation of current  treatment plan related to  self management and patient's adherence to plan as established by provider  Transitions of Care:  Goal Met. 10/05/23 Durable Medical Equipment (DME) needs assessed with patient/caregiver Doctor Visits  - discussed the importance of doctor visits Discussed current clinical condition:  "I am still doing just fine; not having any problems....  since they stopped giving me that Optisumit drug-- I am not having any problems at all.  I haven't even needed to use the nebulizer recently.  No changes with my oxygen-- I just use it when I need to-- but it's not as much as I used to need it when I was on that medicine.  We still have the people from "[employer]" coming in on Tuesday's and Thursday's to help [place] with whatever we need.  Things are going well;"  denies clinical concerns today Verified no recent medication changes;  continues to self-manage medications and denies questions/ concerns around medications today; reinforced goal of SaO2 to be consistently in low 90's Reviewed upcoming provider appointments-- 10/09/23:oncology provider-- treatment- injection/ lab work; 10/23/23: podiatry for orthotic castings- she verbalizes awareness/ plans to attend as scheduled Reinforced rationale for daily weight monitoring at home along with weight gain guidelines/ action plan for weight gain; importance of taking diuretic as prescribed  Reports her weight this morning was "100.4 lbs;" states she has not needed to take additional Lasix this week, as per pulmonary provider instructions: she verbalizes good general understanding of when to take Confirms she continues daily weight monitoring at home: positive reinforcement provided  Patient Goals/Self-Care Activities: Take all medications as prescribed Attend all scheduled provider appointments Call provider office for new concerns or questions  Continue pacing activity to avoid episodes of shortness of breath Continue to follow your  established action plan for episodes of shortness of breath Continue using home oxygen as prescribed Use assistive devices as needed to prevent falls Continue actively working with the home health team that is coming to your home  Follow Up Plan:  Telephone follow up appointment with care management team member scheduled for:  Monday 10/19/23 at 10:00 am-- with longitudinal RN CM          Plan: Telephone follow up appointment with care management team member scheduled for:  Monday 10/19/23 at 10:00 am-- with longitudinal RN CM  Caryl Pina, RN, BSN, CCRN Alumnus RN Care Manager  Transitions of Care  [facility] - [facility] [phone]: direct office

## 2023-10-05 NOTE — Patient Instructions (Signed)
Visit Information  Thank you for taking time to visit with me today. Please don't hesitate to contact me if I can be of assistance to you before our next scheduled telephone appointment.  Your next appointment with the new nurse care manager Donn Pierini is by telephone on Monday 10/19/23 at 10:00 am  Please call the care guide team at [phone] if you need to cancel or reschedule your appointment.   Following are the goals we discussed today:  Patient Goals/Self-Care Activities: Take all medications as prescribed Attend all scheduled provider appointments Call provider office for new concerns or questions  Continue pacing activity to avoid episodes of shortness of breath Continue to follow your established action plan for episodes of shortness of breath Continue using home oxygen as prescribed Use assistive devices as needed to prevent falls Continue actively working with the home health team that is coming to your home  If you are experiencing a Mental Health or Behavioral Health Crisis or need someone to talk to, please  call the Suicide and Crisis Lifeline: 988 call the [place] National Suicide Prevention Lifeline: (249) 393-4880 or TTY: 618-282-5432 TTY 401 008 4983) to talk to a trained counselor call 1-800-273-TALK (toll free, 24 hour hotline) go to [facility] [address] [place] [phone]) call the [facility] Crisis Line: [phone] call 911   Patient verbalizes understanding of instructions and care plan provided today and agrees to view in MyChart. Active MyChart status and patient understanding of how to access instructions and care plan via MyChart confirmed with patient.     Caryl Pina, RN, BSN, Media planner  Transitions of Care  [facility] - [facility] [phone]: direct office

## 2023-10-06 DIAGNOSIS — J9611 Chronic respiratory failure with hypoxia: Secondary | ICD-10-CM | POA: Diagnosis not present

## 2023-10-06 DIAGNOSIS — N1832 Chronic kidney disease, stage 3b: Secondary | ICD-10-CM | POA: Diagnosis not present

## 2023-10-06 DIAGNOSIS — M349 Systemic sclerosis, unspecified: Secondary | ICD-10-CM | POA: Diagnosis not present

## 2023-10-06 DIAGNOSIS — D631 Anemia in chronic kidney disease: Secondary | ICD-10-CM | POA: Diagnosis not present

## 2023-10-06 DIAGNOSIS — J9612 Chronic respiratory failure with hypercapnia: Secondary | ICD-10-CM | POA: Diagnosis not present

## 2023-10-06 DIAGNOSIS — I5033 Acute on chronic diastolic (congestive) heart failure: Secondary | ICD-10-CM | POA: Diagnosis not present

## 2023-10-06 DIAGNOSIS — M341 CR(E)ST syndrome: Secondary | ICD-10-CM | POA: Diagnosis not present

## 2023-10-06 DIAGNOSIS — I130 Hypertensive heart and chronic kidney disease with heart failure and stage 1 through stage 4 chronic kidney disease, or unspecified chronic kidney disease: Secondary | ICD-10-CM | POA: Diagnosis not present

## 2023-10-06 DIAGNOSIS — I3139 Other pericardial effusion (noninflammatory): Secondary | ICD-10-CM | POA: Diagnosis not present

## 2023-10-07 DIAGNOSIS — N1832 Chronic kidney disease, stage 3b: Secondary | ICD-10-CM | POA: Diagnosis not present

## 2023-10-07 DIAGNOSIS — I5033 Acute on chronic diastolic (congestive) heart failure: Secondary | ICD-10-CM | POA: Diagnosis not present

## 2023-10-07 DIAGNOSIS — J9612 Chronic respiratory failure with hypercapnia: Secondary | ICD-10-CM | POA: Diagnosis not present

## 2023-10-07 DIAGNOSIS — M341 CR(E)ST syndrome: Secondary | ICD-10-CM | POA: Diagnosis not present

## 2023-10-07 DIAGNOSIS — D631 Anemia in chronic kidney disease: Secondary | ICD-10-CM | POA: Diagnosis not present

## 2023-10-07 DIAGNOSIS — I3139 Other pericardial effusion (noninflammatory): Secondary | ICD-10-CM | POA: Diagnosis not present

## 2023-10-07 DIAGNOSIS — I130 Hypertensive heart and chronic kidney disease with heart failure and stage 1 through stage 4 chronic kidney disease, or unspecified chronic kidney disease: Secondary | ICD-10-CM | POA: Diagnosis not present

## 2023-10-07 DIAGNOSIS — J9611 Chronic respiratory failure with hypoxia: Secondary | ICD-10-CM | POA: Diagnosis not present

## 2023-10-07 DIAGNOSIS — M349 Systemic sclerosis, unspecified: Secondary | ICD-10-CM | POA: Diagnosis not present

## 2023-10-08 ENCOUNTER — Telehealth: Payer: Self-pay | Admitting: General Practice

## 2023-10-08 NOTE — Telephone Encounter (Signed)
Pt c/o medication issue:  1. Name of Medication:   empagliflozin (JARDIANCE) 10 MG TABS tablet    2. How are you currently taking this medication (dosage and times per day)?  Take 1 tablet (10 mg total) by mouth daily.       3. Are you having a reaction (difficulty breathing--STAT)? No  4. What is your medication issue? Pt is requesting a callback regarding this medication causing her to have side effects like mood changes, her breathing and she stated she's been itching for the last 3 weeks. Please advise

## 2023-10-08 NOTE — Progress Notes (Signed)
Patient and me spoke about options and 1" LLD using 1/2" inside shoe  May drop shoe off to have 1/2" lift add to exterior

## 2023-10-08 NOTE — Telephone Encounter (Signed)
Spoke to patient she stated she stopped taking Jardiance this morning.Stated she has been itching all over.She also is having a memory problem.Advised I will send message to Edd Fabian NP for advice.

## 2023-10-08 NOTE — Telephone Encounter (Signed)
Spoke to patient Paula Ward advised stop taking Jardiance.Appointment with Paula Ward rescheduled to 10/28/23 at 2:45 pm.

## 2023-10-09 ENCOUNTER — Inpatient Hospital Stay: Payer: Medicare PPO

## 2023-10-09 ENCOUNTER — Inpatient Hospital Stay: Payer: Medicare PPO | Attending: Physician Assistant

## 2023-10-09 ENCOUNTER — Other Ambulatory Visit: Payer: Self-pay | Admitting: Hematology and Oncology

## 2023-10-09 VITALS — BP 136/51 | HR 72 | Temp 98.1°F | Resp 14

## 2023-10-09 DIAGNOSIS — E538 Deficiency of other specified B group vitamins: Secondary | ICD-10-CM

## 2023-10-09 DIAGNOSIS — D539 Nutritional anemia, unspecified: Secondary | ICD-10-CM | POA: Diagnosis not present

## 2023-10-09 LAB — CBC WITH DIFFERENTIAL (CANCER CENTER ONLY)
Abs Immature Granulocytes: 0.02 10*3/uL (ref 0.00–0.07)
Basophils Absolute: 0 10*3/uL (ref 0.0–0.1)
Basophils Relative: 1 %
Eosinophils Absolute: 0.2 10*3/uL (ref 0.0–0.5)
Eosinophils Relative: 4 %
HCT: 33.1 % — ABNORMAL LOW (ref 36.0–46.0)
Hemoglobin: 10.7 g/dL — ABNORMAL LOW (ref 12.0–15.0)
Immature Granulocytes: 0 %
Lymphocytes Relative: 17 %
Lymphs Abs: 1 10*3/uL (ref 0.7–4.0)
MCH: 35.8 pg — ABNORMAL HIGH (ref 26.0–34.0)
MCHC: 32.3 g/dL (ref 30.0–36.0)
MCV: 110.7 fL — ABNORMAL HIGH (ref 80.0–100.0)
Monocytes Absolute: 0.3 10*3/uL (ref 0.1–1.0)
Monocytes Relative: 6 %
Neutro Abs: 4.2 10*3/uL (ref 1.7–7.7)
Neutrophils Relative %: 72 %
Platelet Count: 173 10*3/uL (ref 150–400)
RBC: 2.99 MIL/uL — ABNORMAL LOW (ref 3.87–5.11)
RDW: 13.1 % (ref 11.5–15.5)
WBC Count: 5.8 10*3/uL (ref 4.0–10.5)
nRBC: 0 % (ref 0.0–0.2)

## 2023-10-09 LAB — CMP (CANCER CENTER ONLY)
ALT: 15 U/L (ref 0–44)
AST: 31 U/L (ref 15–41)
Albumin: 4.4 g/dL (ref 3.5–5.0)
Alkaline Phosphatase: 70 U/L (ref 38–126)
Anion gap: 8 (ref 5–15)
BUN: 43 mg/dL — ABNORMAL HIGH (ref 8–23)
CO2: 35 mmol/L — ABNORMAL HIGH (ref 22–32)
Calcium: 9.5 mg/dL (ref 8.9–10.3)
Chloride: 93 mmol/L — ABNORMAL LOW (ref 98–111)
Creatinine: 1.27 mg/dL — ABNORMAL HIGH (ref 0.44–1.00)
GFR, Estimated: 41 mL/min — ABNORMAL LOW (ref >60)
Glucose, Bld: 76 mg/dL (ref 70–99)
Potassium: 4.1 mmol/L (ref 3.5–5.1)
Sodium: 136 mmol/L (ref 135–145)
Total Bilirubin: 0.7 mg/dL (ref <1.2)
Total Protein: 6.8 g/dL (ref 6.5–8.1)

## 2023-10-09 LAB — VITAMIN B12: Vitamin B-12: 602 pg/mL (ref 180–914)

## 2023-10-09 MED ORDER — CYANOCOBALAMIN 1000 MCG/ML IJ SOLN
1000.0000 ug | Freq: Once | INTRAMUSCULAR | Status: AC
  Administered 2023-10-09: 1000 ug via INTRAMUSCULAR
  Filled 2023-10-09: qty 1

## 2023-10-11 LAB — METHYLMALONIC ACID, SERUM: Methylmalonic Acid, Quantitative: 640 nmol/L — ABNORMAL HIGH (ref 0–378)

## 2023-10-12 DIAGNOSIS — M349 Systemic sclerosis, unspecified: Secondary | ICD-10-CM | POA: Diagnosis not present

## 2023-10-12 DIAGNOSIS — I3139 Other pericardial effusion (noninflammatory): Secondary | ICD-10-CM | POA: Diagnosis not present

## 2023-10-12 DIAGNOSIS — J9612 Chronic respiratory failure with hypercapnia: Secondary | ICD-10-CM | POA: Diagnosis not present

## 2023-10-12 DIAGNOSIS — N1832 Chronic kidney disease, stage 3b: Secondary | ICD-10-CM | POA: Diagnosis not present

## 2023-10-12 DIAGNOSIS — D631 Anemia in chronic kidney disease: Secondary | ICD-10-CM | POA: Diagnosis not present

## 2023-10-12 DIAGNOSIS — M341 CR(E)ST syndrome: Secondary | ICD-10-CM | POA: Diagnosis not present

## 2023-10-12 DIAGNOSIS — I5033 Acute on chronic diastolic (congestive) heart failure: Secondary | ICD-10-CM | POA: Diagnosis not present

## 2023-10-12 DIAGNOSIS — I130 Hypertensive heart and chronic kidney disease with heart failure and stage 1 through stage 4 chronic kidney disease, or unspecified chronic kidney disease: Secondary | ICD-10-CM | POA: Diagnosis not present

## 2023-10-12 DIAGNOSIS — J9611 Chronic respiratory failure with hypoxia: Secondary | ICD-10-CM | POA: Diagnosis not present

## 2023-10-13 DIAGNOSIS — D631 Anemia in chronic kidney disease: Secondary | ICD-10-CM | POA: Diagnosis not present

## 2023-10-13 DIAGNOSIS — I130 Hypertensive heart and chronic kidney disease with heart failure and stage 1 through stage 4 chronic kidney disease, or unspecified chronic kidney disease: Secondary | ICD-10-CM | POA: Diagnosis not present

## 2023-10-13 DIAGNOSIS — J9612 Chronic respiratory failure with hypercapnia: Secondary | ICD-10-CM | POA: Diagnosis not present

## 2023-10-13 DIAGNOSIS — N1832 Chronic kidney disease, stage 3b: Secondary | ICD-10-CM | POA: Diagnosis not present

## 2023-10-13 DIAGNOSIS — I3139 Other pericardial effusion (noninflammatory): Secondary | ICD-10-CM | POA: Diagnosis not present

## 2023-10-13 DIAGNOSIS — I5033 Acute on chronic diastolic (congestive) heart failure: Secondary | ICD-10-CM | POA: Diagnosis not present

## 2023-10-13 DIAGNOSIS — J9611 Chronic respiratory failure with hypoxia: Secondary | ICD-10-CM | POA: Diagnosis not present

## 2023-10-13 DIAGNOSIS — M349 Systemic sclerosis, unspecified: Secondary | ICD-10-CM | POA: Diagnosis not present

## 2023-10-13 DIAGNOSIS — M341 CR(E)ST syndrome: Secondary | ICD-10-CM | POA: Diagnosis not present

## 2023-10-15 ENCOUNTER — Telehealth (INDEPENDENT_AMBULATORY_CARE_PROVIDER_SITE_OTHER): Payer: Medicare PPO | Admitting: Cardiology

## 2023-10-15 DIAGNOSIS — I5033 Acute on chronic diastolic (congestive) heart failure: Secondary | ICD-10-CM

## 2023-10-15 NOTE — Telephone Encounter (Signed)
Patient identification verified by 2 forms. Marilynn Rail, RN    Called and spoke to patient son Evlyn Clines states:   -Saw NP Molli Hazard, provider stated patient could d/c Jardiance at anytime   -patient is concerned about increased confusion with Jardiance   -patient discontinued jardiance for 2 days, during that time she had SOB   -SOB resolved when she restarted jardiance   -since restarting Jardiance confusion persists   -she is always confusion, seems to have worsened with [place]   -patient continues to use 2-3L of oxygen   -patient has follow up 10/28/23 with NP Molli Hazard   -would like recommendations from provider  Molly Maduro denies:   -chest pain   -SOB  Informed Molly Maduro message sent for assistance  Molly Maduro has no further questions at this time

## 2023-10-15 NOTE — Telephone Encounter (Signed)
Pt c/o medication issue:  1. Name of Medication:   empagliflozin (JARDIANCE) 10 MG TABS tablet   2. How are you currently taking this medication (dosage and times per day)?  As prescribed  3. Are you having a reaction (difficulty breathing--STAT)?   4. What is your medication issue?    Son Molly Maduro) stated patient has been taking Jardiance and her blood sugar was 107 and had stopped it for 2 days.  Son stated for the 2 days patient was off she had SOB so she went back on the medication.  Son wants a call back directly from J. Cleaver on next steps.

## 2023-10-16 MED ORDER — FUROSEMIDE 40 MG PO TABS: 40.0000 mg | ORAL_TABLET | Freq: Every day | ORAL | 2 refills | Status: DC

## 2023-10-16 MED ORDER — FUROSEMIDE 20 MG PO TABS
20.0000 mg | ORAL_TABLET | ORAL | 2 refills | Status: DC
Start: 2023-10-16 — End: 2023-10-30

## 2023-10-16 NOTE — Telephone Encounter (Signed)
Pt son calling back for an update

## 2023-10-16 NOTE — Telephone Encounter (Signed)
Patient identification verified by 2 forms. Marilynn Rail, RN    Called and spoke to patient son Paula Ward provider message below  Reviewed Rx instruction/education  Updated Rx sent to preferred pharmacy Advised Paula Ward to keep 1/8 appointment for follow up   Paula Ward verbalized understanding, no questions or concerns at this time

## 2023-10-16 NOTE — Addendum Note (Signed)
Addended by: Marilynn Rail on: 10/16/2023 05:13 PM   Modules accepted: Orders

## 2023-10-16 NOTE — Telephone Encounter (Signed)
Good morning, please contact Paula Ward and let her know that she should discontinue Jardiance.  We will change her furosemide to 60 mg on Monday Wednesday Friday and continue 40 mg Tuesday Thursday Saturday Sunday.  We will repeat a BMP when they return to the clinic.  Please ask her to continue to eat a heart healthy low-sodium diet.  Also please continue to follow fluid restriction.  I would recommend drinking less than 48 ounces per day.  Thank you  Priscila Bean M. Perlita Forbush NP-C     12 /27/2024, 8:10 AM [facility] [address] Office [phone] Fax [phone]

## 2023-10-18 ENCOUNTER — Other Ambulatory Visit: Payer: Self-pay | Admitting: Gastroenterology

## 2023-10-19 ENCOUNTER — Telehealth: Payer: Self-pay

## 2023-10-19 DIAGNOSIS — I3139 Other pericardial effusion (noninflammatory): Secondary | ICD-10-CM | POA: Diagnosis not present

## 2023-10-19 DIAGNOSIS — I5033 Acute on chronic diastolic (congestive) heart failure: Secondary | ICD-10-CM | POA: Diagnosis not present

## 2023-10-19 DIAGNOSIS — J9612 Chronic respiratory failure with hypercapnia: Secondary | ICD-10-CM | POA: Diagnosis not present

## 2023-10-19 DIAGNOSIS — N1832 Chronic kidney disease, stage 3b: Secondary | ICD-10-CM | POA: Diagnosis not present

## 2023-10-19 DIAGNOSIS — M349 Systemic sclerosis, unspecified: Secondary | ICD-10-CM | POA: Diagnosis not present

## 2023-10-19 DIAGNOSIS — M341 CR(E)ST syndrome: Secondary | ICD-10-CM | POA: Diagnosis not present

## 2023-10-19 DIAGNOSIS — J9611 Chronic respiratory failure with hypoxia: Secondary | ICD-10-CM | POA: Diagnosis not present

## 2023-10-19 DIAGNOSIS — I130 Hypertensive heart and chronic kidney disease with heart failure and stage 1 through stage 4 chronic kidney disease, or unspecified chronic kidney disease: Secondary | ICD-10-CM | POA: Diagnosis not present

## 2023-10-19 DIAGNOSIS — D631 Anemia in chronic kidney disease: Secondary | ICD-10-CM | POA: Diagnosis not present

## 2023-10-19 NOTE — Patient Outreach (Signed)
  Care Coordination   10/19/2023 Name: Paula Ward MRN: [id number] DOB: 30-Jun-1936   Care Coordination Outreach Attempts:  An unsuccessful outreach was attempted for an appointment today.  Follow Up Plan:  Additional outreach attempts will be made to offer the patient complex care management information and services.   Encounter Outcome:  No Answer   Care Coordination Interventions:  No, not indicated    Kathyrn Sheriff, RN, MSN, BSN, CCM Care Management Coordinator [phone]

## 2023-10-20 ENCOUNTER — Telehealth: Payer: Self-pay | Admitting: Internal Medicine

## 2023-10-20 NOTE — Telephone Encounter (Signed)
 Copied from CRM [id number]. Topic: Clinical - Home Health Verbal Orders >> Oct 20, 2023  1:05 PM Corin V wrote: [facility] Number: [phone] Service Requested: Skilled Nursing Frequency: initial evaluation order to restart patient for CHF concerns Any new concerns about the patient? No

## 2023-10-22 ENCOUNTER — Telehealth: Payer: Self-pay | Admitting: Cardiology

## 2023-10-22 NOTE — Telephone Encounter (Signed)
Okay for verbals °

## 2023-10-22 NOTE — Telephone Encounter (Signed)
 Patient is requesting for [place] to start the PA with insurance for the patient to have Home Health Care at home. Please advise.

## 2023-10-22 NOTE — Telephone Encounter (Signed)
 LVM informing the patient of this information.

## 2023-10-23 ENCOUNTER — Ambulatory Visit: Payer: Medicare PPO

## 2023-10-23 NOTE — Progress Notes (Signed)
Going to order slippers for patient from [employer] cost will be $80.00  Order for slippers placed

## 2023-10-25 NOTE — Progress Notes (Deleted)
 Cardiology Clinic Note   Patient Name: Paula Ward Date of Encounter: 10/25/2023  Primary Care Provider:  Rollene Almarie LABOR, MD Primary Cardiologist:  Redell Shallow, MD  Patient Profile    Paula Ward 88 year old female presents the clinic today for follow-up evaluation of her lower extremity edema/chronic diastolic CHF.  Past Medical History    Past Medical History:  Diagnosis Date   Anemia    Angiodysplasia of stomach and duodenum    Aortic stenosis    Arthus phenomenon    AVM (arteriovenous malformation)    Blood transfusion without reported diagnosis    Breast cancer (HCC) 1989   Left   Candida esophagitis (HCC)    Cataract    Chronic respiratory failure (HCC)    CKD stage 3b, GFR 30-44 ml/min (HCC)    Corneal edema    Corneal epithelial basement membrane dystrophy    CREST syndrome (HCC)    GERD (gastroesophageal reflux disease)    w/ HH   Interstitial lung disease (HCC)    Nodule of kidney    Pericardial effusion    PONV (postoperative nausea and vomiting)    Pulmonary embolus (HCC) 2003   Pulmonary hypertension (HCC)    Rectal incontinence    Renal cell carcinoma (HCC)    Scleroderma (HCC)    Tubular adenoma of colon    Uterine polyp    Past Surgical History:  Procedure Laterality Date   APPENDECTOMY  1962   BREAST LUMPECTOMY  1989   left   CATARACT EXTRACTION, BILATERAL Bilateral 12/2013   COLONOSCOPY WITH PROPOFOL  N/A 04/15/2021   Procedure: COLONOSCOPY WITH PROPOFOL ;  Surgeon: Teressa Toribio SQUIBB, MD;  Location: [facility];  Service: Endoscopy;  Laterality: N/A;   ENTEROSCOPY N/A 01/18/2016   Procedure: ENTEROSCOPY;  Surgeon: Gustav Shila GAILS, MD;  Location: [facility];  Service: Endoscopy;  Laterality: N/A;   ENTEROSCOPY N/A 05/24/2018   Procedure: ENTEROSCOPY;  Surgeon: Charlanne Groom, MD;  Location: [facility];  Service: Endoscopy;  Laterality: N/A;   ENTEROSCOPY N/A 03/29/2021   Procedure: ENTEROSCOPY;  Surgeon: Albertus Gordy HERO, MD;   Location: [facility];  Service: Gastroenterology;  Laterality: N/A;   ENTEROSCOPY N/A 04/13/2021   Procedure: ENTEROSCOPY;  Surgeon: Leigh Elspeth SQUIBB, MD;  Location: [facility];  Service: Gastroenterology;  Laterality: N/A;   ESOPHAGOGASTRODUODENOSCOPY (EGD) WITH PROPOFOL  N/A 12/21/2015   Procedure: ESOPHAGOGASTRODUODENOSCOPY (EGD) WITH PROPOFOL ;  Surgeon: Norleen LOISE Kiang, MD;  Location: [facility];  Service: Endoscopy;  Laterality: N/A;   HOT HEMOSTASIS N/A 05/24/2018   Procedure: HOT HEMOSTASIS (ARGON PLASMA COAGULATION/BICAP);  Surgeon: Charlanne Groom, MD;  Location: [facility];  Service: Endoscopy;  Laterality: N/A;   HOT HEMOSTASIS N/A 03/29/2021   Procedure: HOT HEMOSTASIS (ARGON PLASMA COAGULATION/BICAP);  Surgeon: Albertus Gordy HERO, MD;  Location: [facility];  Service: Gastroenterology;  Laterality: N/A;   HOT HEMOSTASIS N/A 04/13/2021   Procedure: HOT HEMOSTASIS (ARGON PLASMA COAGULATION/BICAP);  Surgeon: Leigh Elspeth SQUIBB, MD;  Location: [facility];  Service: Gastroenterology;  Laterality: N/A;   INTRAMEDULLARY (IM) NAIL INTERTROCHANTERIC Left 10/11/2022   Procedure: INTRAMEDULLARY (IM) NAIL INTERTROCHANTERIC;  Surgeon: Melodi Lerner, MD;  Location: [facility];  Service: Orthopedics;  Laterality: Left;   IR GENERIC HISTORICAL  06/05/2016   IR RADIOLOGIST EVAL & MGMT 06/05/2016 Marcey Moan, MD [facility] INTERV RAD   IVC Filter     KIDNEY SURGERY     left -laser surgery by Dr. moan- 5 yrs ago no removal   RIGHT/LEFT HEART CATH AND CORONARY  ANGIOGRAPHY N/A 04/25/2022   Procedure: RIGHT/LEFT HEART CATH AND CORONARY ANGIOGRAPHY;  Surgeon: Wonda Sharper, MD;  Location: [facility] CV [facility];  Service: Cardiovascular;  Laterality: N/A;   SCHLEROTHERAPY  05/24/2018   Procedure: WALDEMAR;  Surgeon: Charlanne Groom, MD;  Location: [facility];  Service: Endoscopy;;   SUBMUCOSAL TATTOO INJECTION  04/13/2021   Procedure: SUBMUCOSAL TATTOO INJECTION;  Surgeon: Leigh Elspeth SQUIBB, MD;   Location: [facility];  Service: Gastroenterology;;   TONSILLECTOMY     TUBAL LIGATION      Allergies  Allergies  Allergen Reactions   Codeine  Nausea Only    Hallucinations, too   Other Nausea And Vomiting and Other (See Comments)    -mycin antibiotics.   Also cause hallucinations.   Erythromycin Nausea And Vomiting   Iodinated Contrast Media Other (See Comments)    Renal issues   Lisinopril     Pt doesn't remember reaction   Mirtazapine  Other (See Comments)    Threw me into orbit   Mycophenolate Mofetil Nausea Only   Sulfa  Antibiotics Other (See Comments)    Unknown reaction    History of Present Illness    Paula Ward 88 year old female has a PMH of essential hypertension, pulmonary hypertension, pericardial effusion, chronic diastolic CHF, AVM, moderate aortic stenosis, orthostatic hypotension, interstitial lung disease, GERD, CKD stage III, PE, incontinence, scleroderma, anemia, generalized weakness, vitamin B12 deficiency, and macrocytic anemia.  Her PMH also includes gastrointestinal bleeding secondary to AVM malformations which required IVC filter placement and breast CA.  She underwent cardiac catheterization 7/23 which showed no obstructive coronary disease, pulmonary hypertension, moderate aortic stenosis with a mean gradient of 16 mmHg.  She was seen in the emergency room on 06/04/2023 due to increased shortness of breath, chest tightness, and lower extremity edema.  Her oxygen  saturation was noted to be 80% on room air.  Her BNP was 2680.  Her chest x-ray showed stable cardiomegaly and pericardial effusion with no pulmonary edema or pulmonary effusion.  Echocardiogram during that time showed LVEF of 70-75%, moderate concentric LVH, mildly reduced RV function and severely dilated LA.  Large pericardial effusion was noted.  This was slightly increased from June.  There was no evidence of tamponade or severe MAC and moderate AS was noted.  She received IV diuresis and  was discharged in stable condition 06/06/2023.  She was seen in follow-up by Katlyn West NP on 06/19/2023.  She reported that she was doing well from a cardiac standpoint.  She denied chest pain and felt her breathing had returned to baseline.  She had been monitoring her weights at home which were stable.  She was having regular lab work at the cancer center and had planned lab work on 06/30/2023.  She contacted the nurse triage line 08/04/2023 and reported that she had increased lower extremity swelling.  She reported a 10 pound weight gain since 8/15 and another 2 pound weight gain since 9/25.  She was taking furosemide  40 mg once daily.  She continued to use lower extremity support stockings.  She was instructed to increase her furosemide  to 40 mg twice daily.  She was added to my schedule.  She presented to the clinic 08/06/23  for follow-up evaluation and stated she noticed a 6 pound weight gain overnight.  She reported that she was around 100 pounds earlier in the week.  Today in the office her weight is 108.2 pounds.  She reported that she was not able to take 40 mg of Lasix  2  times per day so she has been taking 60 mg of Lasix  for the past 2 days.  Her right leg is greater than her left.  She has noted to have +1-2 pitting edema to her knee her right leg and generalized nonpitting edema on her left leg.  I will continued her 60 mg of Lasix  x 4 days and give her 20 mill equivalents of potassium daily. I planned repeat  BMP 1 week and planned follow-up in 1 week.   She had follow-up appointment with pulmonology that week and was stable using 3 L of oxygen  nasal cannula while active.  She presented to the emergency department on 08/13/2023.  She was diagnosed with right lower extremity cellulitis.  She received IV antibiotics and diuresis.  Her furosemide  was transitioned to torsemide .  Her echocardiogram showed known severe LVH, G1 DD, large pericardial effusion without tamponade.  Her lower extremity  ultrasound was negative for DVT.  There was low suspicion for true cellulitis however her Keflex  was continued at discharge.  She presented to the clinic  08/21/23 for follow-up evaluation and stated she had not had good urine output with torsemide .  She had been monitoring her weight at home.  She reported that when she left the hospital her weight was 93-1/2 pounds.  It had steadily increased up to 106 pounds today.  Our clinic scale showed 108 pounds.  She had been weighing after voiding in the morning with the same shoes on.  She presented with her son who asked several questions about her fluid retention and pulmonary hypertension.  All questions were answered.  I asked her to continue daily weights, stopped torsemide  and resumed furosemide  60 mg daily, ordered BMP, kept follow-up for November 14, and asked her follow-up with nephrology as scheduled.  She was admitted to the hospital on 08/29/2023.  Her weight was noted to be around 102 pounds 12 ounces.  Her echocardiogram 08/31/2023 showed an LVEF of 60-65%, mildly dilated left atria, severe mitral annular calcification with mitral stenosis.  Severe calcification of the aortic valve was also noted.  She was noted to have large circumferential pericardial effusion.  No RV diastolic collapse was noted.  She had been readmitted for weight gain, orthopnea, edema.  Her chest x-ray showed improving but persistent CHF and small pleural effusion.  She received IV diuresis.  Her SGLT2 inhibitor was restarted 08/31/2023.  She was continued on spironolactone .  Close follow-up was recommended.  She presented to the clinic 09/09/23 for follow-up evaluation and stated she felt much better  since being discharged from the hospital.  Her weight had been stable at home.  She did have some bilateral ankle edema.  She had been compliant with her medications.  She had been stable with her weight.  We reviewed her medications and she felt much better since being started on  Jardiance .  We reviewed her hospitalizations and diastolic heart failure.  By discussed referral for palliative care.  She agreed to meet with them and discuss options for management/treatment.  I had her continue her weight log, gave iron  rich foods list and asked her to pare them with vitamin C. We plan follow-up in 2 to 3 months.  She reported that she had upcoming appointment with hematology.   She was not able to tolerate Jardiance .  It was discontinued.  I changed her furosemide  to 60 mg Monday Wednesday Friday and continue to her furosemide  40 mg dosing on Tuesday Thursday Saturday Sunday.  I planned BMP  with return to the clinic.  She presents to the clinic today for follow-up evaluation and states***.  Today she denies chest pain, lower extremity edema, fatigue, palpitations, melena, hematuria, hemoptysis, diaphoresis, weakness, presyncope, syncope, orthopnea, and PND.   Home Medications    Prior to Admission medications   Medication Sig Start Date End Date Taking? Authorizing Provider  acetaminophen  (TYLENOL ) 325 MG tablet Take 2 tablets (650 mg total) by mouth every 6 (six) hours as needed for moderate pain. 07/18/22   Christopher Savannah, PA-C  albuterol  (VENTOLIN  HFA) 108 (90 Base) MCG/ACT inhaler INHALE 2 PUFFS INTO THE LUNGS EVERY 6 HOURS AS NEEDED FOR WHEEZING OR SHORTNESS OF BREATH 03/13/23   Rollene Almarie LABOR, MD  augmented betamethasone dipropionate (DIPROLENE-AF) 0.05 % cream Apply topically 2 (two) times daily as needed. 07/13/23   [provider]  clobetasol ointment (TEMOVATE) 0.05 % Apply 1 Application topically 2 (two) times daily. 04/20/23   [provider]  Cyanocobalamin  (VITAMIN B-12) 1000 MCG SUBL Place 1,000 mcg under the tongue. Taking twice a week    [provider]  docusate sodium  (COLACE) 100 MG capsule Take 1 capsule (100 mg total) by mouth 2 (two) times daily. Patient taking differently: Take 100 mg by mouth 2 (two) times daily. Pt takes  every morning 10/15/22   Edmisten, Kristie L, PA  famotidine  (PEPCID ) 20 MG tablet Take 1 tablet (20 mg total) by mouth at bedtime. 02/10/23   Nandigam, Kavitha V, MD  fluticasone  (FLONASE ) 50 MCG/ACT nasal spray Place 1 spray into both nostrils daily as needed for allergies. Patient taking differently: Place 1 spray into both nostrils in the morning and at bedtime. 05/15/22   Rollene Almarie LABOR, MD  furosemide  (LASIX ) 20 MG tablet Take 2 tablets (40 mg total) by mouth daily. 06/19/23   West, Katlyn D, NP  guaiFENesin  (MUCINEX ) 600 MG 12 hr tablet Take 300 mg by mouth daily.    [provider]  ipratropium (ATROVENT ) 0.03 % nasal spray Place 2 sprays into both nostrils every 12 (twelve) hours. 05/12/22   Kara Dorn NOVAK, MD  latanoprost  (XALATAN ) 0.005 % ophthalmic solution Place 1 drop into both eyes at bedtime. 09/23/22   [provider]  magic mouthwash SOLN Take 10 mLs by mouth 4 (four) times daily as needed for mouth pain. 03/22/22   Jillian Buttery, MD  mirtazapine  (REMERON ) 15 MG tablet Take 1 tablet (15 mg total) by mouth at bedtime. 03/13/23   Rollene Almarie LABOR, MD  montelukast  (SINGULAIR ) 10 MG tablet TAKE 1 TABLET BY MOUTH AT BEDTIME 07/31/23   Rollene Almarie LABOR, MD  Multiple Vitamin (MULTIVITAMIN) capsule Take 1 capsule by mouth daily.    [provider]  mupirocin  ointment (BACTROBAN ) 2 % Apply 1 Application topically 2 (two) times daily as needed (dry skin).    [provider]  OPSUMIT  10 MG tablet Take 1 tablet (10 mg total) by mouth daily. 07/07/23   Parrett, Madelin RAMAN, NP  OXYGEN  Inhale into the lungs. 2 LMP at night and upon exertion    [provider]  pantoprazole  (PROTONIX ) 40 MG tablet Take 1 tablet (40 mg total) by mouth daily. 02/10/23   Nandigam, Kavitha V, MD  polyethylene glycol (MIRALAX  / GLYCOLAX ) 17 g packet Take 17 g by mouth daily. 10/15/22   Edmisten, Roxie CROME, PA  spironolactone  (ALDACTONE ) 25 MG tablet Take 1 tablet  (25 mg total) by mouth daily. 08/03/23   Merlynn Niki FALCON, FNP  sucralfate  (CARAFATE ) 1 GM/10ML  suspension TAKE 10 MLS BY MOUTH EVERY 6 HOURS AS NEEDED FOR REFLUX 02/11/23   Nandigam, Kavitha V, MD  triamcinolone  cream (KENALOG ) 0.1 % Apply 1 Application topically 2 (two) times daily. 07/07/23   [provider]    Family History    Family History  Problem Relation Age of Onset   Bladder Cancer Father    Diabetes Father    Prostate cancer Father    Alzheimer's disease Mother    Diabetes Sister    Lung cancer Sister         smoker   Esophageal cancer Paternal Uncle    Colon cancer Neg Hx    She indicated that her mother is deceased. She indicated that her father is deceased. She indicated that the status of her sister is unknown. She indicated that the status of her paternal uncle is unknown. She indicated that the status of her neg hx is unknown.  Social History    Social History   Socioeconomic History   Marital status: Married    Spouse name: Not on file   Number of children: 2   Years of education: Not on file   Highest education level: Not on file  Occupational History   Occupation: Retired  Tobacco Use   Smoking status: Never   Smokeless tobacco: Never  Vaping Use   Vaping status: Never Used  Substance and Sexual Activity   Alcohol use: No   Drug use: No   Sexual activity: Not Currently  Other Topics Concern   Not on file  Social History Narrative   Married '611 son- '65, 1 daughter '63; 6 children (2 adopted)SO- SOBRetirement- doing well. Marriage in good health. Patient has never smoked. Alcohol use- noPt gets regular exercise   Social Drivers of Health   Financial Resource Strain: Low Risk  (04/02/2022)   Overall Financial Resource Strain (CARDIA)    Difficulty of Paying Living Expenses: Not hard at all  Food Insecurity: No Food Insecurity (09/04/2023)   Hunger Vital Sign    Worried About Running Out of Food in the Last Year: Never true    Ran Out  of Food in the Last Year: Never true  Transportation Needs: No Transportation Needs (09/04/2023)   PRAPARE - Administrator, Civil Service (Medical): No    Lack of Transportation (Non-Medical): No  Physical Activity: Inactive (04/02/2022)   Exercise Vital Sign    Days of Exercise per Week: 0 days    Minutes of Exercise per Session: 0 min  Stress: No Stress Concern Present (04/02/2022)   [employer] of Occupational Health - Occupational Stress Questionnaire    Feeling of Stress : Not at all  Social Connections: Socially Integrated (04/02/2022)   Social Connection and Isolation Panel [NHANES]    Frequency of Communication with Friends and Family: More than three times a week    Frequency of Social Gatherings with Friends and Family: More than three times a week    Attends Religious Services: More than 4 times per year    Active Member of [employer] or Organizations: Yes    Attends Banker Meetings: More than 4 times per year    Marital Status: Married  Catering Manager Violence: Not At Risk (09/04/2023)   Humiliation, Afraid, Rape, and Kick questionnaire    Fear of Current or Ex-Partner: No    Emotionally Abused: No    Physically Abused: No    Sexually Abused: No     Review of Systems  General:  No chills, fever, night sweats or weight changes.  Cardiovascular:  No chest pain, dyspnea on exertion, edema, orthopnea, palpitations, paroxysmal nocturnal dyspnea. Dermatological: No rash, lesions/masses Respiratory: No cough, dyspnea Urologic: No hematuria, dysuria Abdominal:   No nausea, vomiting, diarrhea, bright red blood per rectum, melena, or hematemesis Neurologic:  No visual changes, wkns, changes in mental status. All other systems reviewed and are otherwise negative except as noted above.  Physical Exam    VS:  There were no vitals taken for this visit. , BMI There is no height or weight on file to calculate BMI. GEN: Well nourished, well developed,  in no acute distress. HEENT: normal. Neck: Supple, no JVD, carotid bruits, or masses. Cardiac: RRR, 3/6 systolicmurmur heard along right sternal border., rubs, or gallops. No clubbing, cyanosis, right greater than left ankle edema.  Radials/DP/PT 2+ and equal bilaterally.  Respiratory:  Respirations regular and unlabored,  GI: Soft, nontender, nondistended, BS + x 4. MS: no deformity or atrophy. Skin: warm and dry, no rash. Neuro:  Strength and sensation are intact. Psych: Normal affect.  Accessory Clinical Findings    Recent Labs: 06/03/2023: Pro B Natriuretic peptide (BNP) 2,680.0 08/29/2023: B Natriuretic Peptide 2,168.5 09/03/2023: Magnesium 2.4 10/09/2023: ALT 15; BUN 43; Creatinine 1.27; Hemoglobin 10.7; Platelet Count 173; Potassium 4.1; Sodium 136   Recent Lipid Panel    Component Value Date/Time   CHOL 158 06/24/2018 1004   TRIG 112.0 06/24/2018 1004   HDL 57.50 06/24/2018 1004   CHOLHDL 3 06/24/2018 1004   VLDL 22.4 06/24/2018 1004   LDLCALC 78 06/24/2018 1004    No BP recorded.  {Refresh Note OR Click here to enter BP  :1}***    ECG personally reviewed by me today-  none today.     Echocardiogram 06/05/2023  IMPRESSIONS     1. Left ventricular ejection fraction, by estimation, is 70 to 75%. The  left ventricle has hyperdynamic function. The left ventricle has no  regional wall motion abnormalities. There is moderate concentric left  ventricular hypertrophy. Indeterminate  diastolic filling due to E-A fusion.   2. Right ventricular systolic function is mildly reduced. The right  ventricular size is normal.   3. Left atrial size was severely dilated.   4. Large pericardial effusion surrounds heart, mainly posteriorly and  lateral to LV. Naxunak dimension is 5.6 cm posteriorly. It has increased  in size from echo done in June 2024. Does not meet criteria for tamponade  by echo criteria. Clinical  correlation indicated .SABRA Large pericardial effusion.   5. MV  annulus is severely calcified. MV is thickened with restricted  motion. Difficult to see well Mean gradient through the MV is 8 mm Hg. MVA  (Pt1/2 ) 3 cm2. Compared to echo report from June 2024, mean gradient is  incresaed (5 to 8 mm Hg) Consider  TEE to evaluate further.. Trivial mitral valve regurgitation.   6. AV is thickened, calcified with restricted motion. Peak and mean  gradients through the valve are 59 and 37 mm Hg respectively.  Dimensionless index is 0.33 consistent with moderate AS.SABRA The aortic valve  is abnormal. Aortic valve regurgitation is not  visualized.   7. The inferior vena cava is normal in size with <50% respiratory  variability, suggesting right atrial pressure of 8 mmHg.   FINDINGS   Left Ventricle: Left ventricular ejection fraction, by estimation, is 70  to 75%. The left ventricle has hyperdynamic function. The left ventricle  has no regional wall  motion abnormalities. The left ventricular internal  cavity size was small. There is  moderate concentric left ventricular hypertrophy. Indeterminate diastolic  filling due to E-A fusion.   Right Ventricle: The right ventricular size is normal. Right vetricular  wall thickness was not assessed. Right ventricular systolic function is  mildly reduced.   Left Atrium: Left atrial size was severely dilated.   Right Atrium: Right atrial size was normal in size.   Pericardium: Large pericardial effusion surrounds heart, mainly  posteriorly and lateral to LV. Naxunak dimension is 5.6 cm posteriorly. It  has increased in size from echo done in June 2024. Does not meet criteria  for tamponade by echo criteria. Clinical  correlation indicated. A large pericardial effusion is present.   Mitral Valve: MV annulus is severely calcified. MV is thickened with  restricted motion. Difficult to see well Mean gradient through the MV is 8  mm Hg. MVA (Pt1/2 ) 3 cm2. Compared to echo report from June 2024, mean  gradient is  incresaed (5 to 8 mm Hg)  Consider TEE to evaluate further. Trivial mitral valve regurgitation. MV  peak gradient, 13.2 mmHg. The mean mitral valve gradient is 8.0 mmHg.   Tricuspid Valve: The tricuspid valve is normal in structure. Tricuspid  valve regurgitation is mild.   Aortic Valve: AV is thickened, calcified with restricted motion. Peak and  mean gradients through the valve are 59 and 37 mm Hg respectively.  Dimensionless index is 0.33 consistent with moderate AS. The aortic valve  is abnormal. Aortic valve regurgitation   is not visualized. Aortic valve mean gradient measures 37.0 mmHg. Aortic  valve peak gradient measures 58.7 mmHg. Aortic valve area, by VTI measures  0.95 cm.   Pulmonic Valve: The pulmonic valve was not well visualized. Pulmonic valve  regurgitation is mild.   Aorta: The aortic root and ascending aorta are structurally normal, with  no evidence of dilitation.   Venous: The inferior vena cava is normal in size with less than 50%  respiratory variability, suggesting right atrial pressure of 8 mmHg.   IAS/Shunts: No atrial level shunt detected by color flow Doppler.   Echocardiogram 08/15/2023 IMPRESSIONS     1. Left ventricular ejection fraction, by estimation, is >75%. The left  ventricle has hyperdynamic function. The left ventricle has no regional  wall motion abnormalities. There is severe left ventricular hypertrophy.  Left ventricular diastolic  parameters are consistent with Grade I diastolic dysfunction (impaired  relaxation).   2. Right ventricular systolic function is normal. The right ventricular  size is normal.   3. Left atrial size was severely dilated.   4. Large pericardial effusion. The pericardial effusion is  circumferential. There is no evidence of cardiac tamponade.   5. The mitral valve is normal in structure. No evidence of mitral valve  regurgitation. No evidence of mitral stenosis. Severe mitral annular  calcification.   6.  The aortic valve is normal in structure. There is severe calcifcation  of the aortic valve. There is moderate thickening of the aortic valve.  Aortic valve regurgitation is not visualized. Mild aortic valve stenosis.  Aortic valve mean gradient measures   14.8 mmHg. Aortic valve Vmax measures 2.59 m/s.   7. The inferior vena cava is normal in size with greater than 50%  respiratory variability, suggesting right atrial pressure of 3 mmHg.   Comparison(s): Large pericardial effusion noted on prior ECHO, no major  change.   Conclusion(s)/Recommendation(s): Findings consistent with hypertrophic  cardiomyopathy.   FINDINGS  Left Ventricle: Left ventricular ejection fraction, by estimation, is  >75%. The left ventricle has hyperdynamic function. The left ventricle has  no regional wall motion abnormalities. The left ventricular internal  cavity size was normal in size. There  is severe left ventricular hypertrophy. Left ventricular diastolic  parameters are consistent with Grade I diastolic dysfunction (impaired  relaxation).   Right Ventricle: The right ventricular size is normal. No increase in  right ventricular wall thickness. Right ventricular systolic function is  normal.   Left Atrium: Left atrial size was severely dilated.   Right Atrium: Right atrial size was normal in size.   Pericardium: A large pericardial effusion is present. The pericardial  effusion is circumferential. There is no evidence of cardiac tamponade.   Mitral Valve: The mitral valve is normal in structure. Severe mitral  annular calcification. No evidence of mitral valve regurgitation. No  evidence of mitral valve stenosis.   Tricuspid Valve: The tricuspid valve is normal in structure. Tricuspid  valve regurgitation is mild . No evidence of tricuspid stenosis.   Aortic Valve: The aortic valve is normal in structure. There is severe  calcifcation of the aortic valve. There is moderate thickening of the   aortic valve. Aortic valve regurgitation is not visualized. Mild aortic  stenosis is present. Aortic valve mean  gradient measures 14.8 mmHg. Aortic valve peak gradient measures 26.9  mmHg. Aortic valve area, by VTI measures 1.12 cm.   Pulmonic Valve: The pulmonic valve was normal in structure. Pulmonic valve  regurgitation is not visualized. No evidence of pulmonic stenosis.   Aorta: The aortic root is normal in size and structure.   Venous: The inferior vena cava is normal in size with greater than 50%  respiratory variability, suggesting right atrial pressure of 3 mmHg.   IAS/Shunts: No atrial level shunt detected by color flow Doppler.   Echocardiogram 08/31/2023 IMPRESSIONS     1. Left ventricular ejection fraction, by estimation, is 60 to 65%. The  left ventricle has normal function. The left ventricle has no regional  wall motion abnormalities.   2. Right ventricular systolic function is normal. The right ventricular  size is normal.   3. Left atrial size was mildly dilated.   4. Severe mitral annular calcification. Visually there is mitral stenosis  but doppler interrogation was not done.   5. The aortic valve is tricuspid. There is severe calcifcation of the  aortic valve. Visually there is aortic stenosis but doppler interrogation  was not done.   6. The inferior vena cava is normal in size with <50% respiratory  variability, suggesting right atrial pressure of 8 mmHg.   7. There was a large circumferential pericardial effusion. There was no  RV diastolic collapse noted. Mitral E inflow velocity showed < 25%  respirophasic variation. The IVC was not significantly dilated. There does  not appear to be tamponade physiology.   8. Limited echo.   FINDINGS   Left Ventricle: Left ventricular ejection fraction, by estimation, is 60  to 65%. The left ventricle has normal function. The left ventricle has no  regional wall motion abnormalities. The left ventricular  internal cavity  size was normal in size.   Right Ventricle: The right ventricular size is normal. No increase in  right ventricular wall thickness. Right ventricular systolic function is  normal.   Left Atrium: Left atrial size was mildly dilated.   Right Atrium: Right atrial size was normal in size.   Pericardium: There was a large circumferential pericardial  effusion. There  was no RV diastolic collapse noted. Mitral E inflow velocity showed < 25%  respirophasic variation. The IVC was not significantly dilated. There does  not appear to be tamponade  physiology.   Mitral Valve: There is moderate calcification of the mitral valve  leaflet(s). Severe mitral annular calcification.   Aortic Valve: The aortic valve is tricuspid. There is severe calcifcation  of the aortic valve.   Venous: The inferior vena cava is normal in size with less than 50%  respiratory variability, suggesting right atrial pressure of 8 mmHg.   Additional Comments: Spectral Doppler performed. Color Doppler performed.    IVC  IVC diam: 1.50 cm   Dalton McleanMD  Electronically signed by Ezra Kanner  Signature Date/Time: 08/31/2023/4:29:29 PM      Cardiac catheterization 04/25/2022  1.  Ectatic but patent coronary arteries with no obstructive disease.  Right dominant coronary artery. 2.  Mild pulmonary hypertension with PA pressure 52/15, mean 28 mmHg, transpulmonary gradient 10 mmHg, PVR approximately 2 Wood units. 3.  Moderate aortic stenosis with mean transvalvular gradient 16 mmHg calculated aortic valve area 1.16 cm 4.  Heavy mitral annular calcification by plain fluoroscopy 5.  Mild aortic valve calcification by plain fluoroscopy   Recommend: Continued medical therapy, clinical follow-up, echo surveillance.  The patient does not appear to have severe aortic stenosis and I do not think she would benefit from TAVR at this time.   Diagnostic Dominance: Right  Intervention   Assessment &  Plan   1.  Acute on chronic diastolic CHF- Wt today 105.***32.  Bilateral ankle nonpitting edema.  Reviewed hospitalization 08/29/2023 through 09/03/2023.  Discussed option for referral to palliative care.  Patient was unable to tolerate Jardiance .  Medication was discontinued. Continue  spironolactone ,Lasix   Heart healthy low-sodium diet Continue lower extremity support stockings Continue daily weights-contact office with a weight increase of 2 to 3 pounds overnight or 5 pounds in 1 week. Ambulatory referral to palliative care*** Order BMP  Essential hypertension-BP today 11***6/56 Maintain blood pressure log Continue current medical therapy Heart healthy low-sodium diet  Pulmonary hypertension-breathing stable.  Follows with [facility] pulmonary hypertension clinic. Using 3Lof O2 in clinic today.  Not wearing oxygen  in room during exam. Continue current medical therapy  Aortic stenosis-continues to use 2-3 L of oxygen  via nasal cannula.   Plan for repeat echocardiogram in 6 months.*** Continue current medical therapy Increase physical activity as tolerated  CKD stage III-creatinine 1.27 on 10/09/23.    Avoid nephrotoxic agents.   Follows with PCP/nephrology*** Ordered BMP  Disposition: Follow-up with Dr. Pietro or me in 4-6 month.   Josefa HERO. Dellas Guard NP-C     10/25/2023, 1:23 PM [facility] [address] Office [phone] Fax [phone]    I spent 14***minutes examining this patient, reviewing medications, and using patient centered shared decision making involving her cardiac care.   I spent greater than 20 minutes reviewing her past medical history,  medications, and prior cardiac tests.

## 2023-10-26 DIAGNOSIS — J9612 Chronic respiratory failure with hypercapnia: Secondary | ICD-10-CM | POA: Diagnosis not present

## 2023-10-26 DIAGNOSIS — M349 Systemic sclerosis, unspecified: Secondary | ICD-10-CM | POA: Diagnosis not present

## 2023-10-26 DIAGNOSIS — I130 Hypertensive heart and chronic kidney disease with heart failure and stage 1 through stage 4 chronic kidney disease, or unspecified chronic kidney disease: Secondary | ICD-10-CM | POA: Diagnosis not present

## 2023-10-26 DIAGNOSIS — I5033 Acute on chronic diastolic (congestive) heart failure: Secondary | ICD-10-CM | POA: Diagnosis not present

## 2023-10-26 DIAGNOSIS — M341 CR(E)ST syndrome: Secondary | ICD-10-CM | POA: Diagnosis not present

## 2023-10-26 DIAGNOSIS — N1832 Chronic kidney disease, stage 3b: Secondary | ICD-10-CM | POA: Diagnosis not present

## 2023-10-26 DIAGNOSIS — D631 Anemia in chronic kidney disease: Secondary | ICD-10-CM | POA: Diagnosis not present

## 2023-10-26 DIAGNOSIS — J9611 Chronic respiratory failure with hypoxia: Secondary | ICD-10-CM | POA: Diagnosis not present

## 2023-10-26 DIAGNOSIS — I3139 Other pericardial effusion (noninflammatory): Secondary | ICD-10-CM | POA: Diagnosis not present

## 2023-10-26 NOTE — Telephone Encounter (Signed)
 Called and gave ok verbals for patient

## 2023-10-28 ENCOUNTER — Ambulatory Visit: Payer: Medicare PPO | Admitting: General Practice

## 2023-10-28 ENCOUNTER — Telehealth: Payer: Self-pay | Admitting: Internal Medicine

## 2023-10-28 DIAGNOSIS — I5033 Acute on chronic diastolic (congestive) heart failure: Secondary | ICD-10-CM | POA: Diagnosis not present

## 2023-10-28 DIAGNOSIS — I3139 Other pericardial effusion (noninflammatory): Secondary | ICD-10-CM | POA: Diagnosis not present

## 2023-10-28 DIAGNOSIS — M349 Systemic sclerosis, unspecified: Secondary | ICD-10-CM | POA: Diagnosis not present

## 2023-10-28 DIAGNOSIS — M341 CR(E)ST syndrome: Secondary | ICD-10-CM | POA: Diagnosis not present

## 2023-10-28 DIAGNOSIS — D631 Anemia in chronic kidney disease: Secondary | ICD-10-CM | POA: Diagnosis not present

## 2023-10-28 DIAGNOSIS — I130 Hypertensive heart and chronic kidney disease with heart failure and stage 1 through stage 4 chronic kidney disease, or unspecified chronic kidney disease: Secondary | ICD-10-CM | POA: Diagnosis not present

## 2023-10-28 DIAGNOSIS — J9611 Chronic respiratory failure with hypoxia: Secondary | ICD-10-CM | POA: Diagnosis not present

## 2023-10-28 DIAGNOSIS — N1832 Chronic kidney disease, stage 3b: Secondary | ICD-10-CM | POA: Diagnosis not present

## 2023-10-28 DIAGNOSIS — J9612 Chronic respiratory failure with hypercapnia: Secondary | ICD-10-CM | POA: Diagnosis not present

## 2023-10-28 NOTE — Telephone Encounter (Signed)
Copied from CRM [id number]. Topic: Clinical - Home Health Verbal Orders >> Oct 28, 2023 10:54 AM Lennart Pall wrote: Reason for CRM:  [facility] Gala Murdoch [phone]- Nursing Visits- [facility] management- 2x a week for 2 weeks- 1x a week for 4 weeks

## 2023-10-29 ENCOUNTER — Other Ambulatory Visit: Payer: Self-pay | Admitting: Family Medicine

## 2023-10-29 ENCOUNTER — Ambulatory Visit: Payer: Medicare PPO | Admitting: Internal Medicine

## 2023-10-29 DIAGNOSIS — I5032 Chronic diastolic (congestive) heart failure: Secondary | ICD-10-CM

## 2023-10-29 NOTE — Progress Notes (Signed)
 Cardiology Clinic Note   Patient Name: KYANI SIMKIN Date of Encounter: 10/30/2023  Primary Care Provider:  Rollene Almarie LABOR, MD Primary Cardiologist:  Redell Shallow, MD  Patient Profile    ADAJA WANDER 88 year old female presents the clinic today for follow-up evaluation of her lower extremity edema/chronic diastolic CHF.  Past Medical History    Past Medical History:  Diagnosis Date   Anemia    Angiodysplasia of stomach and duodenum    Aortic stenosis    Arthus phenomenon    AVM (arteriovenous malformation)    Blood transfusion without reported diagnosis    Breast cancer (HCC) 1989   Left   Candida esophagitis (HCC)    Cataract    Chronic respiratory failure (HCC)    CKD stage 3b, GFR 30-44 ml/min (HCC)    Corneal edema    Corneal epithelial basement membrane dystrophy    CREST syndrome (HCC)    GERD (gastroesophageal reflux disease)    w/ HH   Interstitial lung disease (HCC)    Nodule of kidney    Pericardial effusion    PONV (postoperative nausea and vomiting)    Pulmonary embolus (HCC) 2003   Pulmonary hypertension (HCC)    Rectal incontinence    Renal cell carcinoma (HCC)    Scleroderma (HCC)    Tubular adenoma of colon    Uterine polyp    Past Surgical History:  Procedure Laterality Date   APPENDECTOMY  1962   BREAST LUMPECTOMY  1989   left   CATARACT EXTRACTION, BILATERAL Bilateral 12/2013   COLONOSCOPY WITH PROPOFOL  N/A 04/15/2021   Procedure: COLONOSCOPY WITH PROPOFOL ;  Surgeon: Teressa Toribio SQUIBB, MD;  Location: [facility];  Service: Endoscopy;  Laterality: N/A;   ENTEROSCOPY N/A 01/18/2016   Procedure: ENTEROSCOPY;  Surgeon: Gustav Shila GAILS, MD;  Location: [facility];  Service: Endoscopy;  Laterality: N/A;   ENTEROSCOPY N/A 05/24/2018   Procedure: ENTEROSCOPY;  Surgeon: Charlanne Groom, MD;  Location: [facility];  Service: Endoscopy;  Laterality: N/A;   ENTEROSCOPY N/A 03/29/2021   Procedure: ENTEROSCOPY;  Surgeon: Albertus Gordy HERO,  MD;  Location: [facility];  Service: Gastroenterology;  Laterality: N/A;   ENTEROSCOPY N/A 04/13/2021   Procedure: ENTEROSCOPY;  Surgeon: Leigh Elspeth SQUIBB, MD;  Location: [facility];  Service: Gastroenterology;  Laterality: N/A;   ESOPHAGOGASTRODUODENOSCOPY (EGD) WITH PROPOFOL  N/A 12/21/2015   Procedure: ESOPHAGOGASTRODUODENOSCOPY (EGD) WITH PROPOFOL ;  Surgeon: Norleen LOISE Kiang, MD;  Location: [facility];  Service: Endoscopy;  Laterality: N/A;   HOT HEMOSTASIS N/A 05/24/2018   Procedure: HOT HEMOSTASIS (ARGON PLASMA COAGULATION/BICAP);  Surgeon: Charlanne Groom, MD;  Location: [facility];  Service: Endoscopy;  Laterality: N/A;   HOT HEMOSTASIS N/A 03/29/2021   Procedure: HOT HEMOSTASIS (ARGON PLASMA COAGULATION/BICAP);  Surgeon: Albertus Gordy HERO, MD;  Location: [facility];  Service: Gastroenterology;  Laterality: N/A;   HOT HEMOSTASIS N/A 04/13/2021   Procedure: HOT HEMOSTASIS (ARGON PLASMA COAGULATION/BICAP);  Surgeon: Leigh Elspeth SQUIBB, MD;  Location: [facility];  Service: Gastroenterology;  Laterality: N/A;   INTRAMEDULLARY (IM) NAIL INTERTROCHANTERIC Left 10/11/2022   Procedure: INTRAMEDULLARY (IM) NAIL INTERTROCHANTERIC;  Surgeon: Melodi Lerner, MD;  Location: [facility];  Service: Orthopedics;  Laterality: Left;   IR GENERIC HISTORICAL  06/05/2016   IR RADIOLOGIST EVAL & MGMT 06/05/2016 Marcey Moan, MD [facility] INTERV RAD   IVC Filter     KIDNEY SURGERY     left -laser surgery by Dr. moan- 5 yrs ago no removal   RIGHT/LEFT HEART CATH AND CORONARY  ANGIOGRAPHY N/A 04/25/2022   Procedure: RIGHT/LEFT HEART CATH AND CORONARY ANGIOGRAPHY;  Surgeon: Wonda Sharper, MD;  Location: [facility];  Service: Cardiovascular;  Laterality: N/A;   SCHLEROTHERAPY  05/24/2018   Procedure: WALDEMAR;  Surgeon: Charlanne Groom, MD;  Location: [facility];  Service: Endoscopy;;   SUBMUCOSAL TATTOO INJECTION  04/13/2021   Procedure: SUBMUCOSAL TATTOO INJECTION;  Surgeon: Leigh Elspeth SQUIBB, MD;   Location: [facility];  Service: Gastroenterology;;   TONSILLECTOMY     TUBAL LIGATION      Allergies  Allergies  Allergen Reactions   Codeine  Nausea Only    Hallucinations, too   Other Nausea And Vomiting and Other (See Comments)    -mycin antibiotics.   Also cause hallucinations.   Erythromycin Nausea And Vomiting   Iodinated Contrast Media Other (See Comments)    Renal issues   Lisinopril     Pt doesn't remember reaction   Mirtazapine  Other (See Comments)    Threw me into orbit   Mycophenolate Mofetil Nausea Only   Sulfa  Antibiotics Other (See Comments)    Unknown reaction    History of Present Illness    JOLEAN MADARIAGA 88 year old female has a PMH of essential hypertension, pulmonary hypertension, pericardial effusion, chronic diastolic CHF, AVM, moderate aortic stenosis, orthostatic hypotension, interstitial lung disease, GERD, CKD stage III, PE, incontinence, scleroderma, anemia, generalized weakness, vitamin B12 deficiency, and macrocytic anemia.  Her PMH also includes gastrointestinal bleeding secondary to AVM malformations which required IVC filter placement and breast CA.  She underwent cardiac catheterization 7/23 which showed no obstructive coronary disease, pulmonary hypertension, moderate aortic stenosis with a mean gradient of 16 mmHg.  She was seen in the emergency room on 06/04/2023 due to increased shortness of breath, chest tightness, and lower extremity edema.  Her oxygen  saturation was noted to be 80% on room air.  Her BNP was 2680.  Her chest x-ray showed stable cardiomegaly and pericardial effusion with no pulmonary edema or pulmonary effusion.  Echocardiogram during that time showed LVEF of 70-75%, moderate concentric LVH, mildly reduced RV function and severely dilated LA.  Large pericardial effusion was noted.  This was slightly increased from June.  There was no evidence of tamponade or severe MAC and moderate AS was noted.  She received IV diuresis and  was discharged in stable condition 06/06/2023.  She was seen in follow-up by Katlyn West NP on 06/19/2023.  She reported that she was doing well from a cardiac standpoint.  She denied chest pain and felt her breathing had returned to baseline.  She had been monitoring her weights at home which were stable.  She was having regular lab work at the cancer center and had planned lab work on 06/30/2023.  She contacted the nurse triage line 08/04/2023 and reported that she had increased lower extremity swelling.  She reported a 10 pound weight gain since 8/15 and another 2 pound weight gain since 9/25.  She was taking furosemide  40 mg once daily.  She continued to use lower extremity support stockings.  She was instructed to increase her furosemide  to 40 mg twice daily.  She was added to my schedule.  She presented to the clinic 08/06/23  for follow-up evaluation and stated she noticed a 6 pound weight gain overnight.  She reported that she was around 100 pounds earlier in the week.  Today in the office her weight is 108.2 pounds.  She reported that she was not able to take 40 mg of Lasix  2  times per day so she has been taking 60 mg of Lasix  for the past 2 days.  Her right leg is greater than her left.  She has noted to have +1-2 pitting edema to her knee her right leg and generalized nonpitting edema on her left leg.  I will continued her 60 mg of Lasix  x 4 days and give her 20 mill equivalents of potassium daily. I planned repeat  BMP 1 week and planned follow-up in 1 week.   She had follow-up appointment with pulmonology that week and was stable using 3 L of oxygen  nasal cannula while active.  She presented to the emergency department on 08/13/2023.  She was diagnosed with right lower extremity cellulitis.  She received IV antibiotics and diuresis.  Her furosemide  was transitioned to torsemide .  Her echocardiogram showed known severe LVH, G1 DD, large pericardial effusion without tamponade.  Her lower extremity  ultrasound was negative for DVT.  There was low suspicion for true cellulitis however her Keflex  was continued at discharge.  She presented to the clinic  08/21/23 for follow-up evaluation and stated she had not had good urine output with torsemide .  She had been monitoring her weight at home.  She reported that when she left the hospital her weight was 93-1/2 pounds.  It had steadily increased up to 106 pounds today.  Our clinic scale showed 108 pounds.  She had been weighing after voiding in the morning with the same shoes on.  She presented with her son who asked several questions about her fluid retention and pulmonary hypertension.  All questions were answered.  I asked her to continue daily weights, stopped torsemide  and resumed furosemide  60 mg daily, ordered BMP, kept follow-up for November 14, and asked her follow-up with nephrology as scheduled.  She was admitted to the hospital on 08/29/2023.  Her weight was noted to be around 102 pounds 12 ounces.  Her echocardiogram 08/31/2023 showed an LVEF of 60-65%, mildly dilated left atria, severe mitral annular calcification with mitral stenosis.  Severe calcification of the aortic valve was also noted.  She was noted to have large circumferential pericardial effusion.  No RV diastolic collapse was noted.  She had been readmitted for weight gain, orthopnea, edema.  Her chest x-ray showed improving but persistent CHF and small pleural effusion.  She received IV diuresis.  Her SGLT2 inhibitor was restarted 08/31/2023.  She was continued on spironolactone .  Close follow-up was recommended.  She presented to the clinic 09/09/23 for follow-up evaluation and stated she felt much better  since being discharged from the hospital.  Her weight had been stable at home.  She did have some bilateral ankle edema.  She had been compliant with her medications.  She had been stable with her weight.  We reviewed her medications and she felt much better since being started on  Jardiance .  We reviewed her hospitalizations and diastolic heart failure.  By discussed referral for palliative care.  She agreed to meet with them and discuss options for management/treatment.  I had her continue her weight log, gave iron  rich foods list and asked her to pare them with vitamin C. We plan follow-up in 2 to 3 months.  She reported that she had upcoming appointment with hematology.   She was not able to tolerate Jardiance .  It was discontinued.  I changed her furosemide  to 60 mg Monday Wednesday Friday and continue to her furosemide  40 mg dosing on Tuesday Thursday Saturday Sunday.  I planned BMP  with return to the clinic.  She presents to the clinic today for follow-up evaluation and states she was not doing well with Jardiance .  She has been maintaining a weight log which shows a weight of 98.2 pounds at home.  Her weight in the clinic today is 101.8 pounds.  She reports that she has not been taking spironolactone  for about 3 to 4 weeks.  I will refill this.  She is currently on 3-4 L of oxygen .  She has upcoming appointment with pulmonology.  She does note that she is somewhat fatigued.  She has upcoming echocardiogram planned for February.  I will decrease her Lasix  to 40 mg daily, refill her spironolactone , order BMP and plan follow-up in 2 to 4 months.  Today she denies chest pain, lower extremity edema, fatigue, palpitations, melena, hematuria, hemoptysis, diaphoresis, weakness, presyncope, syncope, orthopnea, and PND.   Home Medications    Prior to Admission medications   Medication Sig Start Date End Date Taking? Authorizing Provider  acetaminophen  (TYLENOL ) 325 MG tablet Take 2 tablets (650 mg total) by mouth every 6 (six) hours as needed for moderate pain. 07/18/22   Christopher Savannah, PA-C  albuterol  (VENTOLIN  HFA) 108 (90 Base) MCG/ACT inhaler INHALE 2 PUFFS INTO THE LUNGS EVERY 6 HOURS AS NEEDED FOR WHEEZING OR SHORTNESS OF BREATH 03/13/23   Rollene Almarie LABOR, MD  augmented  betamethasone dipropionate (DIPROLENE-AF) 0.05 % cream Apply topically 2 (two) times daily as needed. 07/13/23   [provider]  clobetasol ointment (TEMOVATE) 0.05 % Apply 1 Application topically 2 (two) times daily. 04/20/23   [provider]  Cyanocobalamin  (VITAMIN B-12) 1000 MCG SUBL Place 1,000 mcg under the tongue. Taking twice a week    [provider]  docusate sodium  (COLACE) 100 MG capsule Take 1 capsule (100 mg total) by mouth 2 (two) times daily. Patient taking differently: Take 100 mg by mouth 2 (two) times daily. Pt takes every morning 10/15/22   Edmisten, Kristie L, PA  famotidine  (PEPCID ) 20 MG tablet Take 1 tablet (20 mg total) by mouth at bedtime. 02/10/23   Nandigam, Kavitha V, MD  fluticasone  (FLONASE ) 50 MCG/ACT nasal spray Place 1 spray into both nostrils daily as needed for allergies. Patient taking differently: Place 1 spray into both nostrils in the morning and at bedtime. 05/15/22   Rollene Almarie LABOR, MD  furosemide  (LASIX ) 20 MG tablet Take 2 tablets (40 mg total) by mouth daily. 06/19/23   West, Katlyn D, NP  guaiFENesin  (MUCINEX ) 600 MG 12 hr tablet Take 300 mg by mouth daily.    [provider]  ipratropium (ATROVENT ) 0.03 % nasal spray Place 2 sprays into both nostrils every 12 (twelve) hours. 05/12/22   Kara Dorn NOVAK, MD  latanoprost  (XALATAN ) 0.005 % ophthalmic solution Place 1 drop into both eyes at bedtime. 09/23/22   [provider]  magic mouthwash SOLN Take 10 mLs by mouth 4 (four) times daily as needed for mouth pain. 03/22/22   Jillian Buttery, MD  mirtazapine  (REMERON ) 15 MG tablet Take 1 tablet (15 mg total) by mouth at bedtime. 03/13/23   Rollene Almarie LABOR, MD  montelukast  (SINGULAIR ) 10 MG tablet TAKE 1 TABLET BY MOUTH AT BEDTIME 07/31/23   Rollene Almarie LABOR, MD  Multiple Vitamin (MULTIVITAMIN) capsule Take 1 capsule by mouth daily.    [provider]  mupirocin  ointment (BACTROBAN ) 2 % Apply 1  Application topically 2 (two) times daily as needed (dry skin).    [provider]  OPSUMIT  10 MG tablet Take 1 tablet (10 mg total) by mouth daily. 07/07/23   Parrett, Madelin RAMAN, NP  OXYGEN  Inhale into the lungs. 2 LMP at night and upon exertion    [provider]  pantoprazole  (PROTONIX ) 40 MG tablet Take 1 tablet (40 mg total) by mouth daily. 02/10/23   Nandigam, Kavitha V, MD  polyethylene glycol (MIRALAX  / GLYCOLAX ) 17 g packet Take 17 g by mouth daily. 10/15/22   Edmisten, Kristie L, PA  spironolactone  (ALDACTONE ) 25 MG tablet Take 1 tablet (25 mg total) by mouth daily. 08/03/23   Merlynn Niki FALCON, FNP  sucralfate  (CARAFATE ) 1 GM/10ML suspension TAKE 10 MLS BY MOUTH EVERY 6 HOURS AS NEEDED FOR REFLUX 02/11/23   Nandigam, Kavitha V, MD  triamcinolone  cream (KENALOG ) 0.1 % Apply 1 Application topically 2 (two) times daily. 07/07/23   [provider]    Family History    Family History  Problem Relation Age of Onset   Bladder Cancer Father    Diabetes Father    Prostate cancer Father    Alzheimer's disease Mother    Diabetes Sister    Lung cancer Sister         smoker   Esophageal cancer Paternal Uncle    Colon cancer Neg Hx    She indicated that her mother is deceased. She indicated that her father is deceased. She indicated that the status of her sister is unknown. She indicated that the status of her paternal uncle is unknown. She indicated that the status of her neg hx is unknown.  Social History    Social History   Socioeconomic History   Marital status: Married    Spouse name: Not on file   Number of children: 2   Years of education: Not on file   Highest education level: Not on file  Occupational History   Occupation: Retired  Tobacco Use   Smoking status: Never   Smokeless tobacco: Never  Vaping Use   Vaping status: Never Used  Substance and Sexual Activity   Alcohol use: No   Drug use: No   Sexual activity: Not Currently  Other Topics  Concern   Not on file  Social History Narrative   Married '611 son- '65, 1 daughter '63; 6 children (2 adopted)SO- SOBRetirement- doing well. Marriage in good health. Patient has never smoked. Alcohol use- noPt gets regular exercise   Social Drivers of Health   Financial Resource Strain: Low Risk  (04/02/2022)   Overall Financial Resource Strain (CARDIA)    Difficulty of Paying Living Expenses: Not hard at all  Food Insecurity: No Food Insecurity (09/04/2023)   Hunger Vital Sign    Worried About Running Out of Food in the Last Year: Never true    Ran Out of Food in the Last Year: Never true  Transportation Needs: No Transportation Needs (09/04/2023)   PRAPARE - Administrator, Civil Service (Medical): No    Lack of Transportation (Non-Medical): No  Physical Activity: Inactive (04/02/2022)   Exercise Vital Sign    Days of Exercise per Week: 0 days    Minutes of Exercise per Session: 0 min  Stress: No Stress Concern Present (04/02/2022)   [employer] of Occupational Health - Occupational Stress Questionnaire    Feeling of Stress : Not at all  Social Connections: Socially Integrated (04/02/2022)   Social Connection and Isolation Panel [NHANES]    Frequency of Communication with Friends and Family: More than three times a  week    Frequency of Social Gatherings with Friends and Family: More than three times a week    Attends Religious Services: More than 4 times per year    Active Member of [employer] or Organizations: Yes    Attends Engineer, Structural: More than 4 times per year    Marital Status: Married  Catering Manager Violence: Not At Risk (09/04/2023)   Humiliation, Afraid, Rape, and Kick questionnaire    Fear of Current or Ex-Partner: No    Emotionally Abused: No    Physically Abused: No    Sexually Abused: No     Review of Systems    General:  No chills, fever, night sweats or weight changes.  Cardiovascular:  No chest pain, dyspnea on exertion,  edema, orthopnea, palpitations, paroxysmal nocturnal dyspnea. Dermatological: No rash, lesions/masses Respiratory: No cough, dyspnea Urologic: No hematuria, dysuria Abdominal:   No nausea, vomiting, diarrhea, bright red blood per rectum, melena, or hematemesis Neurologic:  No visual changes, wkns, changes in mental status. All other systems reviewed and are otherwise negative except as noted above.  Physical Exam    VS:  BP 124/64 (BP Location: Right Arm, Patient Position: Sitting, Cuff Size: Small)   Pulse 86   Ht 5' 3 (1.6 m)   Wt 101 lb 12.8 oz (46.2 kg)   SpO2 99%   BMI 18.03 kg/m  , BMI Body mass index is 18.03 kg/m. GEN: Well nourished, well developed, in no acute distress. HEENT: normal. Neck: Supple, no JVD, carotid bruits, or masses. Cardiac: RRR, 3/6 systolicmurmur heard along right sternal border., rubs, or gallops. No clubbing, cyanosis, euvolemic.  Radials/DP/PT 2+ and equal bilaterally.  Respiratory:  Respirations regular and unlabored,  GI: Soft, nontender, nondistended, BS + x 4. MS: no deformity or atrophy. Skin: warm and dry, no rash. Neuro:  Strength and sensation are intact. Psych: Normal affect.  Accessory Clinical Findings    Recent Labs: 06/03/2023: Pro B Natriuretic peptide (BNP) 2,680.0 08/29/2023: B Natriuretic Peptide 2,168.5 09/03/2023: Magnesium 2.4 10/09/2023: ALT 15; BUN 43; Creatinine 1.27; Hemoglobin 10.7; Platelet Count 173; Potassium 4.1; Sodium 136   Recent Lipid Panel    Component Value Date/Time   CHOL 158 06/24/2018 1004   TRIG 112.0 06/24/2018 1004   HDL 57.50 06/24/2018 1004   CHOLHDL 3 06/24/2018 1004   VLDL 22.4 06/24/2018 1004   LDLCALC 78 06/24/2018 1004         ECG personally reviewed by me today-  none today.     Echocardiogram 06/05/2023  IMPRESSIONS     1. Left ventricular ejection fraction, by estimation, is 70 to 75%. The  left ventricle has hyperdynamic function. The left ventricle has no  regional wall  motion abnormalities. There is moderate concentric left  ventricular hypertrophy. Indeterminate  diastolic filling due to E-A fusion.   2. Right ventricular systolic function is mildly reduced. The right  ventricular size is normal.   3. Left atrial size was severely dilated.   4. Large pericardial effusion surrounds heart, mainly posteriorly and  lateral to LV. Naxunak dimension is 5.6 cm posteriorly. It has increased  in size from echo done in June 2024. Does not meet criteria for tamponade  by echo criteria. Clinical  correlation indicated .SABRA Large pericardial effusion.   5. MV annulus is severely calcified. MV is thickened with restricted  motion. Difficult to see well Mean gradient through the MV is 8 mm Hg. MVA  (Pt1/2 ) 3 cm2. Compared to echo report  from June 2024, mean gradient is  incresaed (5 to 8 mm Hg) Consider  TEE to evaluate further.. Trivial mitral valve regurgitation.   6. AV is thickened, calcified with restricted motion. Peak and mean  gradients through the valve are 59 and 37 mm Hg respectively.  Dimensionless index is 0.33 consistent with moderate AS.SABRA The aortic valve  is abnormal. Aortic valve regurgitation is not  visualized.   7. The inferior vena cava is normal in size with <50% respiratory  variability, suggesting right atrial pressure of 8 mmHg.   FINDINGS   Left Ventricle: Left ventricular ejection fraction, by estimation, is 70  to 75%. The left ventricle has hyperdynamic function. The left ventricle  has no regional wall motion abnormalities. The left ventricular internal  cavity size was small. There is  moderate concentric left ventricular hypertrophy. Indeterminate diastolic  filling due to E-A fusion.   Right Ventricle: The right ventricular size is normal. Right vetricular  wall thickness was not assessed. Right ventricular systolic function is  mildly reduced.   Left Atrium: Left atrial size was severely dilated.   Right Atrium: Right atrial  size was normal in size.   Pericardium: Large pericardial effusion surrounds heart, mainly  posteriorly and lateral to LV. Naxunak dimension is 5.6 cm posteriorly. It  has increased in size from echo done in June 2024. Does not meet criteria  for tamponade by echo criteria. Clinical  correlation indicated. A large pericardial effusion is present.   Mitral Valve: MV annulus is severely calcified. MV is thickened with  restricted motion. Difficult to see well Mean gradient through the MV is 8  mm Hg. MVA (Pt1/2 ) 3 cm2. Compared to echo report from June 2024, mean  gradient is incresaed (5 to 8 mm Hg)  Consider TEE to evaluate further. Trivial mitral valve regurgitation. MV  peak gradient, 13.2 mmHg. The mean mitral valve gradient is 8.0 mmHg.   Tricuspid Valve: The tricuspid valve is normal in structure. Tricuspid  valve regurgitation is mild.   Aortic Valve: AV is thickened, calcified with restricted motion. Peak and  mean gradients through the valve are 59 and 37 mm Hg respectively.  Dimensionless index is 0.33 consistent with moderate AS. The aortic valve  is abnormal. Aortic valve regurgitation   is not visualized. Aortic valve mean gradient measures 37.0 mmHg. Aortic  valve peak gradient measures 58.7 mmHg. Aortic valve area, by VTI measures  0.95 cm.   Pulmonic Valve: The pulmonic valve was not well visualized. Pulmonic valve  regurgitation is mild.   Aorta: The aortic root and ascending aorta are structurally normal, with  no evidence of dilitation.   Venous: The inferior vena cava is normal in size with less than 50%  respiratory variability, suggesting right atrial pressure of 8 mmHg.   IAS/Shunts: No atrial level shunt detected by color flow Doppler.   Echocardiogram 08/15/2023 IMPRESSIONS     1. Left ventricular ejection fraction, by estimation, is >75%. The left  ventricle has hyperdynamic function. The left ventricle has no regional  wall motion abnormalities.  There is severe left ventricular hypertrophy.  Left ventricular diastolic  parameters are consistent with Grade I diastolic dysfunction (impaired  relaxation).   2. Right ventricular systolic function is normal. The right ventricular  size is normal.   3. Left atrial size was severely dilated.   4. Large pericardial effusion. The pericardial effusion is  circumferential. There is no evidence of cardiac tamponade.   5. The mitral valve is  normal in structure. No evidence of mitral valve  regurgitation. No evidence of mitral stenosis. Severe mitral annular  calcification.   6. The aortic valve is normal in structure. There is severe calcifcation  of the aortic valve. There is moderate thickening of the aortic valve.  Aortic valve regurgitation is not visualized. Mild aortic valve stenosis.  Aortic valve mean gradient measures   14.8 mmHg. Aortic valve Vmax measures 2.59 m/s.   7. The inferior vena cava is normal in size with greater than 50%  respiratory variability, suggesting right atrial pressure of 3 mmHg.   Comparison(s): Large pericardial effusion noted on prior ECHO, no major  change.   Conclusion(s)/Recommendation(s): Findings consistent with hypertrophic  cardiomyopathy.   FINDINGS   Left Ventricle: Left ventricular ejection fraction, by estimation, is  >75%. The left ventricle has hyperdynamic function. The left ventricle has  no regional wall motion abnormalities. The left ventricular internal  cavity size was normal in size. There  is severe left ventricular hypertrophy. Left ventricular diastolic  parameters are consistent with Grade I diastolic dysfunction (impaired  relaxation).   Right Ventricle: The right ventricular size is normal. No increase in  right ventricular wall thickness. Right ventricular systolic function is  normal.   Left Atrium: Left atrial size was severely dilated.   Right Atrium: Right atrial size was normal in size.   Pericardium: A large  pericardial effusion is present. The pericardial  effusion is circumferential. There is no evidence of cardiac tamponade.   Mitral Valve: The mitral valve is normal in structure. Severe mitral  annular calcification. No evidence of mitral valve regurgitation. No  evidence of mitral valve stenosis.   Tricuspid Valve: The tricuspid valve is normal in structure. Tricuspid  valve regurgitation is mild . No evidence of tricuspid stenosis.   Aortic Valve: The aortic valve is normal in structure. There is severe  calcifcation of the aortic valve. There is moderate thickening of the  aortic valve. Aortic valve regurgitation is not visualized. Mild aortic  stenosis is present. Aortic valve mean  gradient measures 14.8 mmHg. Aortic valve peak gradient measures 26.9  mmHg. Aortic valve area, by VTI measures 1.12 cm.   Pulmonic Valve: The pulmonic valve was normal in structure. Pulmonic valve  regurgitation is not visualized. No evidence of pulmonic stenosis.   Aorta: The aortic root is normal in size and structure.   Venous: The inferior vena cava is normal in size with greater than 50%  respiratory variability, suggesting right atrial pressure of 3 mmHg.   IAS/Shunts: No atrial level shunt detected by color flow Doppler.   Echocardiogram 08/31/2023 IMPRESSIONS     1. Left ventricular ejection fraction, by estimation, is 60 to 65%. The  left ventricle has normal function. The left ventricle has no regional  wall motion abnormalities.   2. Right ventricular systolic function is normal. The right ventricular  size is normal.   3. Left atrial size was mildly dilated.   4. Severe mitral annular calcification. Visually there is mitral stenosis  but doppler interrogation was not done.   5. The aortic valve is tricuspid. There is severe calcifcation of the  aortic valve. Visually there is aortic stenosis but doppler interrogation  was not done.   6. The inferior vena cava is normal in size  with <50% respiratory  variability, suggesting right atrial pressure of 8 mmHg.   7. There was a large circumferential pericardial effusion. There was no  RV diastolic collapse noted. Mitral E inflow  velocity showed < 25%  respirophasic variation. The IVC was not significantly dilated. There does  not appear to be tamponade physiology.   8. Limited echo.   FINDINGS   Left Ventricle: Left ventricular ejection fraction, by estimation, is 60  to 65%. The left ventricle has normal function. The left ventricle has no  regional wall motion abnormalities. The left ventricular internal cavity  size was normal in size.   Right Ventricle: The right ventricular size is normal. No increase in  right ventricular wall thickness. Right ventricular systolic function is  normal.   Left Atrium: Left atrial size was mildly dilated.   Right Atrium: Right atrial size was normal in size.   Pericardium: There was a large circumferential pericardial effusion. There  was no RV diastolic collapse noted. Mitral E inflow velocity showed < 25%  respirophasic variation. The IVC was not significantly dilated. There does  not appear to be tamponade  physiology.   Mitral Valve: There is moderate calcification of the mitral valve  leaflet(s). Severe mitral annular calcification.   Aortic Valve: The aortic valve is tricuspid. There is severe calcifcation  of the aortic valve.   Venous: The inferior vena cava is normal in size with less than 50%  respiratory variability, suggesting right atrial pressure of 8 mmHg.   Additional Comments: Spectral Doppler performed. Color Doppler performed.    IVC  IVC diam: 1.50 cm   Dalton McleanMD  Electronically signed by Ezra Kanner  Signature Date/Time: 08/31/2023/4:29:29 PM      Cardiac catheterization 04/25/2022  1.  Ectatic but patent coronary arteries with no obstructive disease.  Right dominant coronary artery. 2.  Mild pulmonary hypertension with PA  pressure 52/15, mean 28 mmHg, transpulmonary gradient 10 mmHg, PVR approximately 2 Wood units. 3.  Moderate aortic stenosis with mean transvalvular gradient 16 mmHg calculated aortic valve area 1.16 cm 4.  Heavy mitral annular calcification by plain fluoroscopy 5.  Mild aortic valve calcification by plain fluoroscopy   Recommend: Continued medical therapy, clinical follow-up, echo surveillance.  The patient does not appear to have severe aortic stenosis and I do not think she would benefit from TAVR at this time.   Diagnostic Dominance: Right  Intervention   Assessment & Plan   1.  Acute on chronic diastolic CHF- Wt today 101.12oz.  Bilateral ankle nonpitting edema.  Was not able to tolerate Jardiance  medication.  Furosemide  was adjusted to 60 mg Monday Wednesday Friday and 40 mg Tuesday Thursday Saturday.    Continue  spironolactone ,-refill Change Lasix  to 40 mg daily Heart healthy low-sodium diet Continue lower extremity support stockings Continue daily weights-contact office with a weight increase of 2 to 3 pounds overnight or 5 pounds in 1 week. Order BMP  Essential hypertension-BP today 124/64 Maintain blood pressure log Continue current medical therapy Heart healthy low-sodium diet  Pulmonary hypertension-breathing stable.  Follows with [facility] pulmonary hypertension clinic. Using 3-4L of O2 in clinic today.  Not wearing oxygen  in room during exam. Continue current medical therapy  Aortic stenosis-continues to use 3-4 L of oxygen  via nasal cannula.   Plan for repeat echocardiogram as Continue current medical therapy Increase physical activity as tolerated  CKD stage III-creatinine 1.27 on 10/09/23.    Avoid nephrotoxic agents.   Follows with PCP/nephrology Ordered BMP  Disposition: Follow-up with Dr. Pietro or me in 4-6 month.   Josefa HERO. Yamaira Spinner NP-C     10/30/2023, 2:13 PM [facility] [address] Office [phone] Fax  [phone]    I spent 14 minutes examining this patient, reviewing medications, and using patient centered shared decision making involving her cardiac care.   I spent greater than 20 minutes reviewing her past medical history,  medications, and prior cardiac tests.

## 2023-10-29 NOTE — Telephone Encounter (Signed)
 Ok for [employer]

## 2023-10-30 ENCOUNTER — Encounter: Payer: Self-pay | Admitting: General Practice

## 2023-10-30 ENCOUNTER — Other Ambulatory Visit: Payer: Self-pay | Admitting: Internal Medicine

## 2023-10-30 ENCOUNTER — Ambulatory Visit: Payer: Medicare PPO | Attending: General Practice | Admitting: General Practice

## 2023-10-30 VITALS — BP 124/64 | HR 86 | Ht 63.0 in | Wt 101.8 lb

## 2023-10-30 DIAGNOSIS — I272 Pulmonary hypertension, unspecified: Secondary | ICD-10-CM

## 2023-10-30 DIAGNOSIS — I10 Essential (primary) hypertension: Secondary | ICD-10-CM

## 2023-10-30 DIAGNOSIS — I5032 Chronic diastolic (congestive) heart failure: Secondary | ICD-10-CM

## 2023-10-30 DIAGNOSIS — N1832 Chronic kidney disease, stage 3b: Secondary | ICD-10-CM

## 2023-10-30 DIAGNOSIS — I5033 Acute on chronic diastolic (congestive) heart failure: Secondary | ICD-10-CM | POA: Diagnosis not present

## 2023-10-30 DIAGNOSIS — I350 Nonrheumatic aortic (valve) stenosis: Secondary | ICD-10-CM | POA: Diagnosis not present

## 2023-10-30 MED ORDER — SPIRONOLACTONE 25 MG PO TABS: 25.0000 mg | ORAL_TABLET | Freq: Every day | ORAL | 3 refills | Status: DC

## 2023-10-30 NOTE — Patient Instructions (Addendum)
 Medication Instructions:  Continue taking Lasix  40 mg daily only  *If you need a refill on your cardiac medications before your next appointment, please call your pharmacy*   Lab Work: BMET  If you have labs (blood work) drawn today and your tests are completely normal, you will receive your results only by: MyChart Message (if you have MyChart) OR A paper copy in the mail If you have any lab test that is abnormal or we need to change your treatment, we will call you to review the results.   Testing/Procedures: ECHO Scheduled 12/03/2023   Follow-Up: At [facility], you and your health needs are our priority.  As part of our continuing mission to provide you with exceptional heart care, we have created designated Provider Care Teams.  These Care Teams include your primary Cardiologist (physician) and Advanced Practice Providers (APPs -  Physician Assistants and Nurse Practitioners) who all work together to provide you with the care you need, when you need it.  We recommend signing up for the patient portal called MyChart.  Sign up information is provided on this After Visit Summary.  MyChart is used to connect with patients for Virtual Visits (Telemedicine).  Patients are able to view lab/test results, encounter notes, upcoming appointments, etc.  Non-urgent messages can be sent to your provider as well.   To learn more about what you can do with MyChart, go to [email or web address].    Your next appointment:   2-4 month(s)  Provider:   Josefa Beauvais, FNP or Redell Shallow, MD     Other Instructions Continue weight log

## 2023-10-31 LAB — BASIC METABOLIC PANEL
BUN/Creatinine Ratio: 35 — ABNORMAL HIGH (ref 12–28)
BUN: 48 mg/dL — ABNORMAL HIGH (ref 8–27)
CO2: 33 mmol/L — ABNORMAL HIGH (ref 20–29)
Calcium: 9.5 mg/dL (ref 8.7–10.3)
Chloride: 96 mmol/L (ref 96–106)
Creatinine, Ser: 1.39 mg/dL — ABNORMAL HIGH (ref 0.57–1.00)
Glucose: 104 mg/dL — ABNORMAL HIGH (ref 70–99)
Potassium: 4.7 mmol/L (ref 3.5–5.2)
Sodium: 145 mmol/L — ABNORMAL HIGH (ref 134–144)
eGFR: 37 mL/min/{1.73_m2} — ABNORMAL LOW (ref >59)

## 2023-11-02 ENCOUNTER — Ambulatory Visit: Payer: Medicare PPO | Admitting: General Practice

## 2023-11-02 DIAGNOSIS — I509 Heart failure, unspecified: Secondary | ICD-10-CM | POA: Diagnosis not present

## 2023-11-03 ENCOUNTER — Telehealth: Payer: Self-pay

## 2023-11-03 DIAGNOSIS — I5033 Acute on chronic diastolic (congestive) heart failure: Secondary | ICD-10-CM | POA: Diagnosis not present

## 2023-11-03 DIAGNOSIS — J9612 Chronic respiratory failure with hypercapnia: Secondary | ICD-10-CM | POA: Diagnosis not present

## 2023-11-03 DIAGNOSIS — N1832 Chronic kidney disease, stage 3b: Secondary | ICD-10-CM | POA: Diagnosis not present

## 2023-11-03 DIAGNOSIS — D631 Anemia in chronic kidney disease: Secondary | ICD-10-CM | POA: Diagnosis not present

## 2023-11-03 DIAGNOSIS — J9611 Chronic respiratory failure with hypoxia: Secondary | ICD-10-CM | POA: Diagnosis not present

## 2023-11-03 DIAGNOSIS — I3139 Other pericardial effusion (noninflammatory): Secondary | ICD-10-CM | POA: Diagnosis not present

## 2023-11-03 DIAGNOSIS — M341 CR(E)ST syndrome: Secondary | ICD-10-CM | POA: Diagnosis not present

## 2023-11-03 DIAGNOSIS — I130 Hypertensive heart and chronic kidney disease with heart failure and stage 1 through stage 4 chronic kidney disease, or unspecified chronic kidney disease: Secondary | ICD-10-CM | POA: Diagnosis not present

## 2023-11-03 DIAGNOSIS — M349 Systemic sclerosis, unspecified: Secondary | ICD-10-CM | POA: Diagnosis not present

## 2023-11-03 NOTE — Telephone Encounter (Signed)
This has been done already

## 2023-11-03 NOTE — Telephone Encounter (Signed)
 Called and gave verbals.

## 2023-11-03 NOTE — Telephone Encounter (Signed)
 Copied from CRM [id number]. Topic: Clinical - Home Health Verbal Orders >> Nov 03, 2023  9:32 AM Richerd A wrote: Caller/Agency: Tenechi from [facility] Callback Number: [phone] Service Requested: RN did not say; she was inquiring about the orders Frequency: N/A Any new concerns about the patient? No

## 2023-11-03 NOTE — Telephone Encounter (Signed)
 I see a separate message with ok for verbals. Ok to give those. If a new need route to me.

## 2023-11-04 MED ORDER — FUROSEMIDE 40 MG PO TABS: 40.0000 mg | ORAL_TABLET | Freq: Every day | ORAL | 2 refills | Status: DC

## 2023-11-04 NOTE — Addendum Note (Signed)
 Addended by: DERONDA ROSALINE CROME on: 11/04/2023 10:26 AM   Modules accepted: Orders

## 2023-11-05 ENCOUNTER — Ambulatory Visit: Payer: Self-pay

## 2023-11-05 DIAGNOSIS — I3139 Other pericardial effusion (noninflammatory): Secondary | ICD-10-CM | POA: Diagnosis not present

## 2023-11-05 DIAGNOSIS — D631 Anemia in chronic kidney disease: Secondary | ICD-10-CM | POA: Diagnosis not present

## 2023-11-05 DIAGNOSIS — I509 Heart failure, unspecified: Secondary | ICD-10-CM | POA: Diagnosis not present

## 2023-11-05 DIAGNOSIS — J9611 Chronic respiratory failure with hypoxia: Secondary | ICD-10-CM | POA: Diagnosis not present

## 2023-11-05 DIAGNOSIS — J9612 Chronic respiratory failure with hypercapnia: Secondary | ICD-10-CM | POA: Diagnosis not present

## 2023-11-05 DIAGNOSIS — I5033 Acute on chronic diastolic (congestive) heart failure: Secondary | ICD-10-CM | POA: Diagnosis not present

## 2023-11-05 DIAGNOSIS — M349 Systemic sclerosis, unspecified: Secondary | ICD-10-CM | POA: Diagnosis not present

## 2023-11-05 DIAGNOSIS — M341 CR(E)ST syndrome: Secondary | ICD-10-CM | POA: Diagnosis not present

## 2023-11-05 DIAGNOSIS — I130 Hypertensive heart and chronic kidney disease with heart failure and stage 1 through stage 4 chronic kidney disease, or unspecified chronic kidney disease: Secondary | ICD-10-CM | POA: Diagnosis not present

## 2023-11-05 DIAGNOSIS — N1832 Chronic kidney disease, stage 3b: Secondary | ICD-10-CM | POA: Diagnosis not present

## 2023-11-05 NOTE — Patient Instructions (Signed)
Visit Information  Thank you for taking time to visit with me today. Please don't hesitate to contact me if I can be of assistance to you.   Following are the goals we discussed today:  Continue to take medications as prescribed. Continue to attend provider visits as scheduled Continue to eat healthy, lean meats, vegetables, fruits, avoid saturated and transfats Continue to monitor and log daily weights and notify cardiologist/provider with signs/symptoms of worsening condition  Contact provider with health questions or concerns as needed  Our next appointment is by telephone on 12/02/23 at 1:00 pm  Please call the care guide team at [phone] if you need to cancel or reschedule your appointment.   If you are experiencing a Mental Health or Behavioral Health Crisis or need someone to talk to, please call the Suicide and Crisis Lifeline: 988 call the [place] National Suicide Prevention Lifeline: 678-363-2490 or TTY: 3037218695 TTY 4172858930) to talk to a trained counselor  Kathyrn Sheriff, RN, MSN, BSN, CCM VBCI RN Care Manager [phone]

## 2023-11-05 NOTE — Patient Outreach (Signed)
  Care Coordination   Initial Visit Note   11/05/2023 Name: Paula Ward MRN: [id number] DOB: 15-Aug-1936  Paula Ward is a 88 y.o. year old female who sees [name], MD for primary care. I spoke with  Paula Ward by phone today.  What matters to the patients health and wellness today? RNCM received referral post [facility] program. Paula Ward reports she is better overall. And has good days and bad days. She states this is determined by her energy level. Patient lives with her husband. She reports her appetite is good. She has addition in home care support 4 hour/day about 4 days/week with [facility]. She reports her oxygen is at 3L/[place] supplied by [employer]. Paula Ward states she weighs and logs daily weights and denies any signs/symptoms of HF exacerbation at this time. LOV with cardiology was 10/30/2023. Per review of chart referral for Palliative care. Patient with no questions or concerns at this time.  Goals Addressed             This Visit's Progress    Assist with management of Health Conditions       Interventions Today    Flowsheet Row Most Recent Value  Chronic Disease   Chronic disease during today's visit Congestive Heart Failure (CHF), Other, Hypertension (HTN), Chronic Kidney Disease/End Stage Renal Disease (ESRD)  [ILD, O2 at 3L/[place]]  General Interventions   General Interventions Discussed/Reviewed General Interventions Discussed, Durable Medical Equipment (DME), [facility], Doctor Visits  [Evaluation of current treatment plan for health condition and patient's adherence to plan. assessed patient knowledge of HF management, medications, management of health. discussed home support-confirmed has [facility] in home care assistance.]  Doctor Visits Discussed/Reviewed PCP, Specialist  [reviewed upcoming/scheduled appointments]  Durable Medical Equipment (DME) Oxygen, BP Cuff, Other  [O2 at 3l/[place] (supplier [employer]), scales, uses cane]  Education  Interventions   Education Provided Provided Education  Provided Verbal Education On When to see the doctor, Medication, Nutrition, Other  [advised to continue to take medications as prescribed, attend provider visits as recommended, contact provider with questions or worsening of condition, reviewed signs/symptoms of HF exacerbation with patient]  Nutrition Interventions   Nutrition Discussed/Reviewed Nutrition Discussed, Supplemental nutrition  [discussed patient appetite. confirmed patient drinks nutritional supplement as recommended]  Pharmacy Interventions   Pharmacy Dicussed/Reviewed Pharmacy Topics Discussed  [medications reviewed, assessed if any medication management assistance needs]  Safety Interventions   Safety Discussed/Reviewed Safety Discussed, Home Safety  [remains active with home health nursing ([facility]).]  Home Safety Assistive Devices  Advanced Directive Interventions   Advanced Directives Discussed/Reviewed --  [reiterated Palliative care referral per cardiology to discuss program-patient acknowledged]            SDOH assessments and interventions completed:  No recently completed. Patient reports no changes.   Care Coordination Interventions:  Yes, provided   Follow up plan: Follow up call scheduled for 12/02/23    Encounter Outcome:  Patient Visit Completed   Kathyrn Sheriff, RN, MSN, BSN, CCM VBCI RN Care Manager [phone]

## 2023-11-06 ENCOUNTER — Other Ambulatory Visit: Payer: Self-pay | Admitting: Hematology and Oncology

## 2023-11-06 ENCOUNTER — Inpatient Hospital Stay: Payer: Medicare PPO | Attending: Physician Assistant

## 2023-11-06 ENCOUNTER — Inpatient Hospital Stay: Payer: Medicare PPO

## 2023-11-06 VITALS — BP 116/43 | HR 79 | Temp 98.0°F

## 2023-11-06 DIAGNOSIS — E538 Deficiency of other specified B group vitamins: Secondary | ICD-10-CM

## 2023-11-06 DIAGNOSIS — D539 Nutritional anemia, unspecified: Secondary | ICD-10-CM | POA: Insufficient documentation

## 2023-11-06 LAB — CBC WITH DIFFERENTIAL (CANCER CENTER ONLY)
Abs Immature Granulocytes: 0.02 10*3/uL (ref 0.00–0.07)
Basophils Absolute: 0 10*3/uL (ref 0.0–0.1)
Basophils Relative: 1 %
Eosinophils Absolute: 0.2 10*3/uL (ref 0.0–0.5)
Eosinophils Relative: 3 %
HCT: 30.3 % — ABNORMAL LOW (ref 36.0–46.0)
Hemoglobin: 9.8 g/dL — ABNORMAL LOW (ref 12.0–15.0)
Immature Granulocytes: 0 %
Lymphocytes Relative: 14 %
Lymphs Abs: 0.7 10*3/uL (ref 0.7–4.0)
MCH: 35.3 pg — ABNORMAL HIGH (ref 26.0–34.0)
MCHC: 32.3 g/dL (ref 30.0–36.0)
MCV: 109 fL — ABNORMAL HIGH (ref 80.0–100.0)
Monocytes Absolute: 0.4 10*3/uL (ref 0.1–1.0)
Monocytes Relative: 7 %
Neutro Abs: 3.9 10*3/uL (ref 1.7–7.7)
Neutrophils Relative %: 75 %
Platelet Count: 153 10*3/uL (ref 150–400)
RBC: 2.78 MIL/uL — ABNORMAL LOW (ref 3.87–5.11)
RDW: 12.4 % (ref 11.5–15.5)
WBC Count: 5.3 10*3/uL (ref 4.0–10.5)
nRBC: 0 % (ref 0.0–0.2)

## 2023-11-06 LAB — CMP (CANCER CENTER ONLY)
ALT: 14 U/L (ref 0–44)
AST: 26 U/L (ref 15–41)
Albumin: 4.2 g/dL (ref 3.5–5.0)
Alkaline Phosphatase: 69 U/L (ref 38–126)
Anion gap: 8 (ref 5–15)
BUN: 57 mg/dL — ABNORMAL HIGH (ref 8–23)
CO2: 37 mmol/L — ABNORMAL HIGH (ref 22–32)
Calcium: 9.8 mg/dL (ref 8.9–10.3)
Chloride: 94 mmol/L — ABNORMAL LOW (ref 98–111)
Creatinine: 1.35 mg/dL — ABNORMAL HIGH (ref 0.44–1.00)
GFR, Estimated: 39 mL/min — ABNORMAL LOW (ref >60)
Glucose, Bld: 121 mg/dL — ABNORMAL HIGH (ref 70–99)
Potassium: 4.1 mmol/L (ref 3.5–5.1)
Sodium: 139 mmol/L (ref 135–145)
Total Bilirubin: 0.6 mg/dL (ref 0.0–1.2)
Total Protein: 6.8 g/dL (ref 6.5–8.1)

## 2023-11-06 LAB — VITAMIN B12: Vitamin B-12: 588 pg/mL (ref 180–914)

## 2023-11-06 MED ORDER — CYANOCOBALAMIN 1000 MCG/ML IJ SOLN
1000.0000 ug | Freq: Once | INTRAMUSCULAR | Status: AC
  Administered 2023-11-06: 1000 ug via INTRAMUSCULAR
  Filled 2023-11-06: qty 1

## 2023-11-08 LAB — METHYLMALONIC ACID, SERUM: Methylmalonic Acid, Quantitative: 764 nmol/L — ABNORMAL HIGH (ref 0–378)

## 2023-11-10 ENCOUNTER — Telehealth: Payer: Self-pay | Admitting: Internal Medicine

## 2023-11-10 NOTE — Telephone Encounter (Signed)
Copied from CRM [id number]. Topic: General - Other >> Nov 10, 2023 12:11 PM Kathryne Eriksson wrote: Reason for CRM: [facility] >> Nov 10, 2023 12:15 PM Kathryne Eriksson wrote: Paula Ward "[facility]" called on behalf of the patient, stating that the insurance is no longer covering her stay therefor she will be discharged / removed from care this week. Patient is aware of this information as well. Call back number is [phone] .

## 2023-11-11 ENCOUNTER — Ambulatory Visit: Payer: Medicare PPO | Admitting: Pulmonary Disease

## 2023-11-11 ENCOUNTER — Encounter: Payer: Self-pay | Admitting: Pulmonary Disease

## 2023-11-11 VITALS — BP 116/60 | HR 89 | Ht 63.0 in | Wt 101.0 lb

## 2023-11-11 DIAGNOSIS — I272 Pulmonary hypertension, unspecified: Secondary | ICD-10-CM

## 2023-11-11 DIAGNOSIS — J9611 Chronic respiratory failure with hypoxia: Secondary | ICD-10-CM | POA: Diagnosis not present

## 2023-11-11 MED ORDER — YUPELRI 175 MCG/3ML IN SOLN: 175.0000 ug | Freq: Every day | RESPIRATORY_TRACT | 6 refills | Status: DC

## 2023-11-11 MED ORDER — ARFORMOTEROL TARTRATE 15 MCG/2ML IN NEBU: 15.0000 ug | INHALATION_SOLUTION | Freq: Two times a day (BID) | RESPIRATORY_TRACT | 6 refills | Status: DC

## 2023-11-11 NOTE — Patient Instructions (Signed)
Nice to see you again  I recommend trying nebulized medicines to see if it helps with your breathing since the short inhalers help a little bit  Use Brovana twice a day via nebulizer, morning and evening  Use Yupelri once a day via nebulizer, in the morning  See if this helps with your breathing during the day.  No other changes to medication.  Return to clinic in 3 months or sooner as needed with Dr. Judeth Horn

## 2023-11-11 NOTE — Progress Notes (Signed)
@Patient  ID: Paula Ward, female    DOB: 05/28/1936, 88 y.o.   MRN: [id number]  Chief Complaint  Patient presents with   Follow-up    Pulmonary hypertension F/U visit    Referring provider: [name], *  HPI:   88 y.o. 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   Most recent cardiology note reviewed.    Returns for routine follow-up.  Maintaining off of ERA given development of left-sided heart disease and volume overload with administration.  Her fluid levels in the doing quite well.  Recent cardiology note reviewed, they have decreased the dose of her Lasix.  Appears relatively euvolemic today with mild lower extremity swelling.  Weight is down overall.  She uses rescue inhalers.  They help a little bit.  Discussed trying additional bronchodilators via nebulization given her significant weakness and interstitial lung disease, parenchymal lung disease.  She remains quite dyspneic.  We discussed that there are multiple etiologies for this noted to have a good fix for her.  Suspect of pulmonary hypertension, heart failure, and ILD will progress over time leading to worsening symptoms.  We discussed goals of care in that situation.  She has documentation for DNR.  We discussed hospice services.  She is interested in these.  Not currently but she thinks it would be good for her m when her symptoms worsen.  HPI at initial visit: Patient here for evaluation of pulmonary hypertension.  Multiple hospitalizations for decompensated CHF left-sided disease, pulmonary venous hypertension.  As evidenced by serial images with pleural effusions and pulmonary edema.  Her echocardiogram over time is showing a severely dilated left atrium with valvular dysfunction as well as diastolic dysfunction.  Fortunately her RV functional serial echocardiograms  look fine.  Her most recent right heart catheterization LVEDP of 18, PVR of 2 Wood units.  Her prior right heart catheterization at [facility] years prior with a PVR less than 2.  Both times on Opsumit.  Her initial cath 2001 with PVR 5.  We discussed at length the etiology of her pulmonary hypertension.  Has evolved over time.  The changing physiology of her pulmonary hypertension as a relates to her left-sided disease and valvular disease and likely worsening on pulmonary vasodilators.  She expressed understanding.  She is feeling well since discharge.  Feels better off the Opsumit.  Discussed how VQ mismatch could worsen on Opsumit given her interstitial lung disease.  Overall her hypoxemia seems improved.  She walked in the clinic today and immediate after sitting down with send saturation was 94%.  We discussed ongoing oxygen use with exertion that she is to monitor at home as discussed below.   Questionaires / Pulmonary Flowsheets:   ACT:      No data to display          MMRC:     No data to display          Epworth:      No data to display          Tests:   FENO:  No results found for: "NITRICOXIDE"  PFT:    Latest Ref Rng & Units 08/25/2023    2:45 PM 02/03/2022   11:01 AM  PFT Results  FVC-Pre L 0.96  1.34   FVC-Predicted Pre % 42  56   FVC-Post L 0.91    FVC-Predicted Post %  40    Pre FEV1/FVC % % 79  84   Post FEV1/FCV % % 83    FEV1-Pre L 0.76  1.12   FEV1-Predicted Pre % 45  64   FEV1-Post L 0.76    DLCO uncorrected ml/min/mmHg 7.39  10.32   DLCO UNC% % 41  57   DLCO corrected ml/min/mmHg 8.46  11.49   DLCO COR %Predicted % 47  63   DLVA Predicted % 119  119   TLC L 3.02  2.88   TLC % Predicted % 61  58   RV % Predicted % 86  62   Personally reviewed and interpreted at time spirometry suggestive of severe restriction versus air trapping, no bronchodilator response.  Lung volumes consistent with severe restriction, DLCO severely reduced.  WALK:      07/15/2023    4:27 PM 03/13/2022    3:15 PM 01/06/2022   12:08 PM  SIX MIN WALK  Supplimental Oxygen during Test? (L/min) No    2 Minute Oxygen Saturation %  90 % 89 %  2 Minute HR  107 111  4 Minute Oxygen Saturation %  89 % 93 %  4 Minute HR  111 113  6 Minute Oxygen Saturation %  88 % 92 %  6 Minute HR  108 118  Tech Comments: patient walked one lap with no complaints, O2 sat 88%, patient aware to use POC 2L w/exertion/activity      Imaging: Personally reviewed and as per EMR and discussion in this note No results found.  Lab Results: Personally reviewed CBC    Component Value Date/Time   WBC 5.3 11/06/2023 1432   WBC 6.4 09/14/2023 1406   RBC 2.78 (L) 11/06/2023 1432   HGB 9.8 (L) 11/06/2023 1432   HGB 11.2 04/21/2022 0940   HCT 30.3 (L) 11/06/2023 1432   HCT 32.7 (L) 04/21/2022 0940   PLT 153 11/06/2023 1432   PLT 242 04/21/2022 0940   MCV 109.0 (H) 11/06/2023 1432   MCV 104 (H) 04/21/2022 0940   MCH 35.3 (H) 11/06/2023 1432   MCHC 32.3 11/06/2023 1432   RDW 12.4 11/06/2023 1432   RDW 13.1 04/21/2022 0940   LYMPHSABS 0.7 11/06/2023 1432   MONOABS 0.4 11/06/2023 1432   EOSABS 0.2 11/06/2023 1432   BASOSABS 0.0 11/06/2023 1432    BMET    Component Value Date/Time   NA 139 11/06/2023 1432   NA 145 (H) 10/30/2023 1434   K 4.1 11/06/2023 1432   CL 94 (L) 11/06/2023 1432   CO2 37 (H) 11/06/2023 1432   GLUCOSE 121 (H) 11/06/2023 1432   GLUCOSE 101 (H) 10/02/2006 0845   BUN 57 (H) 11/06/2023 1432   BUN 48 (H) 10/30/2023 1434   CREATININE 1.35 (H) 11/06/2023 1432   CALCIUM 9.8 11/06/2023 1432   GFRNONAA 39 (L) 11/06/2023 1432   GFRAA >60 05/24/2018 0652    BNP    Component Value Date/Time   BNP 2,168.5 (H) 08/29/2023 1613    ProBNP    Component Value Date/Time   PROBNP 2,680.0 (H) 06/03/2023 1200    Specialty Problems       Pulmonary Problems   ILD (interstitial lung disease) (HCC)   Qualifier: Diagnosis of  By: Genelle Gather CMA, [place]         Allergic rhinitis   Globus pharyngeus   Chronic respiratory failure with hypoxia (HCC)   Chronic respiratory failure with hypoxia and hypercapnia (HCC)   HC03   06/30/23  33  -  07/15/2023   Walked on RA  x  1  lap(s) =  approx 250  ft  @ slow pace, stopped due to desats to 88% corrected on 2lpm  - 07/15/2023 increase HS to 3lpm and recheck  ONO on 3lpm       Dependence on supplemental oxygen    Allergies  Allergen Reactions   Codeine Nausea Only    Hallucinations, too   Other Nausea And Vomiting and Other (See Comments)    "-mycin" antibiotics.   Also cause hallucinations.   Erythromycin Nausea And Vomiting   Iodinated Contrast Media Other (See Comments)    Renal issues   Lisinopril     Pt doesn't remember reaction   Mirtazapine Other (See Comments)    "Threw me into orbit"   Mycophenolate Mofetil Nausea Only   Sulfa Antibiotics Other (See Comments)    Unknown reaction    Immunization History  Administered Date(s) Administered   Fluad Quad(high Dose 65+) 07/24/2020   Influenza, High Dose Seasonal PF 07/07/2017, 06/21/2019   Influenza-Unspecified 07/15/2013, 02/16/2015, 07/19/2015, 07/18/2016, 08/20/2018, 06/24/2021, 08/03/2022   Moderna Sars-Covid-2 Vaccination 11/01/2019, 11/29/2019, 09/14/2020, 02/16/2021   Pneumococcal Conjugate-13 01/30/2014   Pneumococcal Polysaccharide-23 08/30/2007   Respiratory Syncytial Virus Vaccine,Recomb Aduvanted(Arexvy) 09/24/2022   Tdap 06/26/2011, 06/07/2018   Zoster, Live 08/15/2014    Past Medical History:  Diagnosis Date   Anemia    Angiodysplasia of stomach and duodenum    Aortic stenosis    Arthus phenomenon    AVM (arteriovenous malformation)    Blood transfusion without reported diagnosis    Breast cancer (HCC) 1989   Left   Candida esophagitis (HCC)    Cataract    Chronic respiratory failure (HCC)    CKD stage 3b, GFR 30-44 ml/min (HCC)    Corneal edema    Corneal epithelial basement membrane dystrophy    CREST syndrome  (HCC)    GERD (gastroesophageal reflux disease)    w/ HH   Interstitial lung disease (HCC)    Nodule of kidney    Pericardial effusion    PONV (postoperative nausea and vomiting)    Pulmonary embolus (HCC) 2003   Pulmonary hypertension (HCC)    Rectal incontinence    Renal cell carcinoma (HCC)    Scleroderma (HCC)    Tubular adenoma of colon    Uterine polyp     Tobacco History: Social History   Tobacco Use  Smoking Status Never  Smokeless Tobacco Never   Counseling given: Not Answered   Continue to not smoke  Outpatient Encounter Medications as of 11/11/2023  Medication Sig   acetaminophen (TYLENOL) 325 MG tablet Take 2 tablets (650 mg total) by mouth every 6 (six) hours as needed for moderate pain.   albuterol (PROVENTIL) (2.5 MG/3ML) 0.083% nebulizer solution Take 3 mLs (2.5 mg total) by nebulization every 6 (six) hours as needed for wheezing or shortness of breath.   albuterol (VENTOLIN HFA) 108 (90 Base) MCG/ACT inhaler INHALE 2 PUFFS INTO THE LUNGS EVERY 6 HOURS AS NEEDED FOR WHEEZING OR SHORTNESS OF BREATH (Patient taking differently: Inhale 2 puffs into the lungs every 6 (six) hours as needed for wheezing or shortness of breath.)   arformoterol (BROVANA) 15 MCG/2ML NEBU Take 2 mLs (15 mcg total) by nebulization 2 (two) times daily.   augmented betamethasone dipropionate (DIPROLENE-AF) 0.05 % cream Apply 1 application  topically 2 (two) times daily as needed (for irritation).   clobetasol ointment (TEMOVATE) 0.05 % Apply 1 Application topically 2 (two) times  daily as needed (for irritation).   diltiazem (CARDIZEM) 30 MG tablet Take 1 tablet (30 mg total) by mouth every 12 (twelve) hours.   docusate sodium (COLACE) 100 MG capsule Take 1 capsule (100 mg total) by mouth 2 (two) times daily. (Patient taking differently: Take 100 mg by mouth in the morning.)   empagliflozin (JARDIANCE) 10 MG TABS tablet Take 1 tablet (10 mg total) by mouth daily.   famotidine (PEPCID) 20 MG  tablet Take 1 tablet (20 mg total) by mouth at bedtime.   fluticasone (FLONASE) 50 MCG/ACT nasal spray USE 1 SPRAY(S) IN EACH NOSTRIL ONCE DAILY AS NEEDED FOR ALLERGIES (Patient taking differently: Place 1 spray into both nostrils in the morning.)   furosemide (LASIX) 40 MG tablet Take 1 tablet (40 mg total) by mouth daily.   guaiFENesin (MUCINEX) 600 MG 12 hr tablet Take 600 mg by mouth 2 (two) times daily as needed.   ipratropium (ATROVENT) 0.03 % nasal spray Place 2 sprays into both nostrils every 12 (twelve) hours. (Patient taking differently: Place 2 sprays into both nostrils at bedtime.)   latanoprost (XALATAN) 0.005 % ophthalmic solution Place 1 drop into both eyes at bedtime.   magic mouthwash SOLN Take 10 mLs by mouth 4 (four) times daily as needed for mouth pain.   mupirocin ointment (BACTROBAN) 2 % Apply 1 Application topically 2 (two) times daily as needed (for irritation).   Nutritional Supplements (ENSURE HIGH PROTEIN) LIQD Take 0.5 Bottles by mouth daily as needed (for supplementation).   OXYGEN Inhale 2-3 L/min into the lungs continuous.   pantoprazole (PROTONIX) 40 MG tablet TAKE 1 TABLET BY MOUTH TWICE DAILY BEFORE A MEAL   polyethylene glycol (MIRALAX / GLYCOLAX) 17 g packet Take 17 g by mouth daily.   revefenacin (YUPELRI) 175 MCG/3ML nebulizer solution Take 3 mLs (175 mcg total) by nebulization daily.   spironolactone (ALDACTONE) 25 MG tablet Take 1 tablet (25 mg total) by mouth daily.   sucralfate (CARAFATE) 1 GM/10ML suspension TAKE  10 ML BY MOUTH EVERY 6 HOURS AS NEEDED FOR REFLUX   triamcinolone cream (KENALOG) 0.1 % Apply 1 Application topically See admin instructions. Apply to affected leg(s) after showers and up to 2 additional times a day as needed for irritation   [DISCONTINUED] montelukast (SINGULAIR) 10 MG tablet TAKE 1 TABLET BY MOUTH AT BEDTIME   No facility-administered encounter medications on file as of 11/11/2023.     Review of Systems  Review of Systems   N/a Physical Exam  BP 116/60 (BP Location: Left Arm, Patient Position: Sitting, Cuff Size: Normal)   Pulse 89   Ht 5\' 3"  (1.6 m)   Wt 101 lb (45.8 kg)   SpO2 95%   BMI 17.89 kg/m   Wt Readings from Last 5 Encounters:  11/11/23 101 lb (45.8 kg)  10/30/23 101 lb 12.8 oz (46.2 kg)  09/16/23 104 lb (47.2 kg)  09/14/23 103 lb (46.7 kg)  09/09/23 105 lb 3.2 oz (47.7 kg)    BMI Readings from Last 5 Encounters:  11/11/23 17.89 kg/m  10/30/23 18.03 kg/m  09/16/23 18.42 kg/m  09/14/23 18.25 kg/m  09/09/23 18.64 kg/m     Physical Exam General: Sitting in chair, frail, elderly Eyes: EOMI, no icterus Neck: Supple, no JVP Pulmonary: Crackles in bilateral bases, otherwise clear, distant, on 3 L pulsed via POC Cardiovascular: Warm, trace edema to above ankles bilaterally Abdomen: Nondistended, bowel sounds present MSK: No synovitis, joint effusion is noted at the MCP joints Neuro: Normal  gait, no weakness Psych: Normal mood, full affect patient   Assessment & Plan:   Pulmonary hypertension: Documented on the right heart cath first diagnosed 2001.  Serial markers reviewed.  Multifactorial nature of disease primarily group 2 and 3 disease given elevated wedge pressure and mild ILD, hypoxemia.  At time of initial cath in 2001 PVR was elevated over 5 for group 1 disease likely was primary tumor back then, driven by scleroderma and CTD.  However, has a physiology of her heart has changed over time with diastolic dysfunction, aortic stenosis, mitral valve stenosis, diastolic dysfunction and development of left atrial hypertension, pulmonary venous hypertension, suspect systemic pulmonary vasodilators has led to exacerbation of left-sided congestive heart failure.  Multiple admissions for CHF with signs of pulmonary edema and pleural effusions again indicating pulmonary venous hypertension.  Her most recent catheterizations have indicated a PVR less than 3, admittedly on ERA, Opsumit.   However given left-sided disease and left-sided lower pulmonary venous hypertension, we stopped her Opsumit.  She is feeling better.  Possibly contributing to worsening VQ mismatch as well in the setting of her ILD.  Frankly, given her frailty and complex and multifactorial disease, additional pulmonary vasodilators are likely not in her best interest.  She remains stable off Opsumit, weight stable, decreasing diuretic doses via her cardiology office.  Further titration of diuretics via cardiology.  Stressed importance of low-salt diet and diuretic therapy.  She expressed understanding.  Fortunately RV function on most recent echocardiogram was excellent.  Chronic hypoxemic respiratory failure: Likely multifactorial but primarily driven by CT ILD.  Encouraged to check oxygen to keep in the mid 90s if able especially with exertion to avoid hypoxic vasoconstriction which can worsen acute pulmonary hypertension symptoms are chronically contribute to worsening pulmonary hypertension and RV failure.  On 3 L nasal cannula pulsed via POC today.  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  Largely unchanged over serial images over the last several years.  No progression.  In the past has declined antifibrotic's given side effect profile.  Given her age and frailty this is reasonable.  Goals of care: We discussed her wishes if she were to worsen which is likely given multiple progressive disease phenotypes.  She states she would want to be a DNR/DNI.  We discussed that she would likely benefit from hospice services if and when she declines.  She is amenable to hospice services.  She does not wish to engage them today but will contemplate them moving forward and certainly encouraged her to consider them urgently if and when symptoms worsen.  I spent 5 minutes in advance care planning discussion this visit.   Return in about 3 months (around 02/09/2024) for f/u Dr. Judeth Horn.   Karren Burly,  MD 11/11/2023   This appointment required 47 minutes of patient care (this includes precharting, chart review, review of results, face-to-face care, etc.).

## 2023-11-12 ENCOUNTER — Telehealth: Payer: Self-pay

## 2023-11-12 ENCOUNTER — Other Ambulatory Visit (HOSPITAL_COMMUNITY): Payer: Self-pay

## 2023-11-12 NOTE — Telephone Encounter (Signed)
Pharmacy Patient Advocate Encounter   Received notification from CoverMyMeds that prior authorization for Yupelri 175MCG/3ML solution is required/requested.   Insurance verification completed.   The patient is insured through [employer] .   Per test claim: PA required; PA submitted to above mentioned insurance via CoverMyMeds Key/confirmation #/EOC [id number] Status is pending

## 2023-11-13 ENCOUNTER — Other Ambulatory Visit: Payer: Self-pay | Admitting: Internal Medicine

## 2023-11-17 NOTE — Telephone Encounter (Signed)
Pharmacy Patient Advocate Encounter  Received notification from [facility] that Prior Authorization for Yupelri 175MCG/3ML solution has been DENIED.  Full denial letter will be uploaded to the media tab. See denial reason below.  You asked for the drug above for your Chronic respiratory failure with hypoxia. This is an off-label use that is not medically accepted. The Medicare rule in the Medicare Benefit Policy Manual says an off-label use of a drug is a use that is not included on the drug's label as approved by the FDA. Drugs prescribed for a use other than what is on the FDA label may be covered if there is proof in the major drug guides, medical journals or standard medical practices that the drug works for your condition. No support was found for the requested use. [employer] has decided that this drug is not covered per Medicare rules.   PA #/Case ID/Reference #: [id number]

## 2023-11-18 ENCOUNTER — Telehealth: Payer: Self-pay

## 2023-11-18 ENCOUNTER — Other Ambulatory Visit: Payer: Self-pay | Admitting: Internal Medicine

## 2023-11-18 ENCOUNTER — Other Ambulatory Visit: Payer: Medicare PPO

## 2023-11-18 DIAGNOSIS — Z961 Presence of intraocular lens: Secondary | ICD-10-CM | POA: Diagnosis not present

## 2023-11-18 DIAGNOSIS — H401432 Capsular glaucoma with pseudoexfoliation of lens, bilateral, moderate stage: Secondary | ICD-10-CM | POA: Diagnosis not present

## 2023-11-18 NOTE — Telephone Encounter (Signed)
Left vm to reschedule tricia is out of office.

## 2023-11-18 NOTE — Telephone Encounter (Signed)
Paula Ward has been denied by pts Part B using dx J96.11. Please advise of change.

## 2023-11-19 DIAGNOSIS — J849 Interstitial pulmonary disease, unspecified: Secondary | ICD-10-CM

## 2023-11-19 DIAGNOSIS — I2729 Other secondary pulmonary hypertension: Secondary | ICD-10-CM

## 2023-11-19 DIAGNOSIS — D631 Anemia in chronic kidney disease: Secondary | ICD-10-CM | POA: Diagnosis not present

## 2023-11-19 DIAGNOSIS — E871 Hypo-osmolality and hyponatremia: Secondary | ICD-10-CM

## 2023-11-19 DIAGNOSIS — M341 CR(E)ST syndrome: Secondary | ICD-10-CM | POA: Diagnosis not present

## 2023-11-19 DIAGNOSIS — M349 Systemic sclerosis, unspecified: Secondary | ICD-10-CM | POA: Diagnosis not present

## 2023-11-19 DIAGNOSIS — J9612 Chronic respiratory failure with hypercapnia: Secondary | ICD-10-CM | POA: Diagnosis not present

## 2023-11-19 DIAGNOSIS — I5033 Acute on chronic diastolic (congestive) heart failure: Secondary | ICD-10-CM | POA: Diagnosis not present

## 2023-11-19 DIAGNOSIS — I130 Hypertensive heart and chronic kidney disease with heart failure and stage 1 through stage 4 chronic kidney disease, or unspecified chronic kidney disease: Secondary | ICD-10-CM | POA: Diagnosis not present

## 2023-11-19 DIAGNOSIS — N1832 Chronic kidney disease, stage 3b: Secondary | ICD-10-CM | POA: Diagnosis not present

## 2023-11-19 DIAGNOSIS — I3139 Other pericardial effusion (noninflammatory): Secondary | ICD-10-CM | POA: Diagnosis not present

## 2023-11-19 DIAGNOSIS — J9611 Chronic respiratory failure with hypoxia: Secondary | ICD-10-CM | POA: Diagnosis not present

## 2023-11-24 ENCOUNTER — Other Ambulatory Visit (HOSPITAL_COMMUNITY): Payer: Self-pay

## 2023-11-25 ENCOUNTER — Ambulatory Visit: Payer: Medicare PPO

## 2023-11-25 ENCOUNTER — Other Ambulatory Visit: Payer: Self-pay | Admitting: Hematology and Oncology

## 2023-11-25 ENCOUNTER — Ambulatory Visit: Payer: Medicare PPO | Admitting: Pulmonary Disease

## 2023-11-25 DIAGNOSIS — M21764 Unequal limb length (acquired), left fibula: Secondary | ICD-10-CM | POA: Diagnosis not present

## 2023-11-25 DIAGNOSIS — E538 Deficiency of other specified B group vitamins: Secondary | ICD-10-CM

## 2023-11-25 DIAGNOSIS — M2170 Unequal limb length (acquired), unspecified site: Secondary | ICD-10-CM

## 2023-11-25 NOTE — Progress Notes (Addendum)
Left shoe sent today to [employer] for " lift to be added to sole  Patient to pay if rejected patient is aware   UPS tracking [id number] [phone]

## 2023-11-26 ENCOUNTER — Inpatient Hospital Stay: Payer: Medicare PPO

## 2023-11-26 ENCOUNTER — Inpatient Hospital Stay: Payer: Medicare PPO | Attending: Physician Assistant | Admitting: Hematology and Oncology

## 2023-11-26 DIAGNOSIS — E538 Deficiency of other specified B group vitamins: Secondary | ICD-10-CM

## 2023-11-26 DIAGNOSIS — D649 Anemia, unspecified: Secondary | ICD-10-CM

## 2023-11-26 DIAGNOSIS — N1832 Chronic kidney disease, stage 3b: Secondary | ICD-10-CM | POA: Diagnosis not present

## 2023-11-26 DIAGNOSIS — D539 Nutritional anemia, unspecified: Secondary | ICD-10-CM | POA: Insufficient documentation

## 2023-11-26 LAB — CMP (CANCER CENTER ONLY)
ALT: 14 U/L (ref 0–44)
AST: 24 U/L (ref 15–41)
Albumin: 4.2 g/dL (ref 3.5–5.0)
Alkaline Phosphatase: 73 U/L (ref 38–126)
Anion gap: 7 (ref 5–15)
BUN: 63 mg/dL — ABNORMAL HIGH (ref 8–23)
CO2: 39 mmol/L — ABNORMAL HIGH (ref 22–32)
Calcium: 9.2 mg/dL (ref 8.9–10.3)
Chloride: 94 mmol/L — ABNORMAL LOW (ref 98–111)
Creatinine: 1.7 mg/dL — ABNORMAL HIGH (ref 0.44–1.00)
GFR, Estimated: 29 mL/min — ABNORMAL LOW (ref >60)
Glucose, Bld: 84 mg/dL (ref 70–99)
Potassium: 3.9 mmol/L (ref 3.5–5.1)
Sodium: 140 mmol/L (ref 135–145)
Total Bilirubin: 0.6 mg/dL (ref 0.0–1.2)
Total Protein: 7.2 g/dL (ref 6.5–8.1)

## 2023-11-26 LAB — RETIC PANEL
Immature Retic Fract: 7.9 % (ref 2.3–15.9)
RBC.: 2.74 MIL/uL — ABNORMAL LOW (ref 3.87–5.11)
Retic Count, Absolute: 28.5 10*3/uL (ref 19.0–186.0)
Retic Ct Pct: 1 % (ref 0.4–3.1)
Reticulocyte Hemoglobin: 36.2 pg (ref >27.9)

## 2023-11-26 LAB — VITAMIN B12: Vitamin B-12: 601 pg/mL (ref 180–914)

## 2023-11-26 LAB — CBC WITH DIFFERENTIAL (CANCER CENTER ONLY)
Abs Immature Granulocytes: 0.08 10*3/uL — ABNORMAL HIGH (ref 0.00–0.07)
Basophils Absolute: 0 10*3/uL (ref 0.0–0.1)
Basophils Relative: 1 %
Eosinophils Absolute: 0.2 10*3/uL (ref 0.0–0.5)
Eosinophils Relative: 4 %
HCT: 31.8 % — ABNORMAL LOW (ref 36.0–46.0)
Hemoglobin: 9.9 g/dL — ABNORMAL LOW (ref 12.0–15.0)
Immature Granulocytes: 2 %
Lymphocytes Relative: 13 %
Lymphs Abs: 0.7 10*3/uL (ref 0.7–4.0)
MCH: 35 pg — ABNORMAL HIGH (ref 26.0–34.0)
MCHC: 31.1 g/dL (ref 30.0–36.0)
MCV: 112.4 fL — ABNORMAL HIGH (ref 80.0–100.0)
Monocytes Absolute: 0.4 10*3/uL (ref 0.1–1.0)
Monocytes Relative: 8 %
Neutro Abs: 3.9 10*3/uL (ref 1.7–7.7)
Neutrophils Relative %: 72 %
Platelet Count: 167 10*3/uL (ref 150–400)
RBC: 2.83 MIL/uL — ABNORMAL LOW (ref 3.87–5.11)
RDW: 12.4 % (ref 11.5–15.5)
WBC Count: 5.3 10*3/uL (ref 4.0–10.5)
nRBC: 0 % (ref 0.0–0.2)

## 2023-11-26 LAB — IRON AND IRON BINDING CAPACITY (CC-WL,HP ONLY)
Iron: 66 ug/dL (ref 28–170)
Saturation Ratios: 33 % — ABNORMAL HIGH (ref 10.4–31.8)
TIBC: 202 ug/dL — ABNORMAL LOW (ref 250–450)
UIBC: 136 ug/dL — ABNORMAL LOW (ref 148–442)

## 2023-11-26 LAB — FERRITIN: Ferritin: 1049 ng/mL — ABNORMAL HIGH (ref 11–307)

## 2023-11-26 NOTE — Progress Notes (Signed)
 [facility] Telephone:([phone]   [phone]  PROGRESS NOTE  Patient Care Team: Rollene Almarie LABOR, MD as PCP - General (Internal Medicine) Pietro Redell RAMAN, MD as PCP - Cardiology (Cardiology) Prentiss Heddy HERO, RN as [facility] Wallace, Juana M, RN as [facility] Care Management  CHIEF COMPLAINTS/PURPOSE OF CONSULTATION:  Macrocytic anemia  Vitamin B12 deficiency  HISTORY OF PRESENTING ILLNESS:  On review of the previous records, there is evidence of chronic anemia for several years stable between 10-11. Most recent labs from 2/29/204 showed Hgb had declined to 9.8, MCV 100.3. No other cytopenias. Iron  panel shows no evidence of iron  deficiency.  She has received periodic iron  infusions, most recently received IV feraheme  510 mg x 2 doses in October 2023. She was last given 2 units of PRBC in December 2023 after being hospitalized for fracture of left femur which required intramedullary nailing with ortho. Lastly, she receives retracrit 10,000 units every 4 weeks, last given on 12/30/2022.   INTERVAL HISTORY: Paula Ward 88 y.o. female returns for a follow up for macrocytic anemia.  She is accompanied by her husband for this visit.  She was last seen on 06/30/2023 and in the interim, she has continued on vitamin B12 sublingual supplementation.   On exam today, Paula Ward reports she has been very middling in the interim since her last visit.  She reports that she does not get a boost in energy from her B12 shots.  She reports he is also taking p.o. vitamin B12 supplementation.  She reports that she has an energy level about 7 out of 10.  She notes that she dozes off easily, particularly watching television.  She does that she has been eating well.  She notes that she is not having any shortness of breath or chest pain.  She is using a nebulizer as prescribed by her pulmonary doctor.  She reports that she is on about 3 L of oxygen  at baseline.  She does  have occasional episodes of dizziness.  She reports that her providers have asked her to gain weight and that she is doing her best to try to increase body mass.  She reports she has had no infectious symptoms recently.  She also denies any bleeding, bruising, or dark stools. She denies fevers, chills, sweats, chest pain or cough. She has no other complaints. Rest of the 10 ROS is below.   MEDICAL HISTORY:  Past Medical History:  Diagnosis Date   Anemia    Angiodysplasia of stomach and duodenum    Aortic stenosis    Arthus phenomenon    AVM (arteriovenous malformation)    Blood transfusion without reported diagnosis    Breast cancer (HCC) 1989   Left   Candida esophagitis (HCC)    Cataract    Chronic respiratory failure (HCC)    CKD stage 3b, GFR 30-44 ml/min (HCC)    Corneal edema    Corneal epithelial basement membrane dystrophy    CREST syndrome (HCC)    GERD (gastroesophageal reflux disease)    w/ HH   Interstitial lung disease (HCC)    Nodule of kidney    Pericardial effusion    PONV (postoperative nausea and vomiting)    Pulmonary embolus (HCC) 2003   Pulmonary hypertension (HCC)    Rectal incontinence    Renal cell carcinoma (HCC)    Scleroderma (HCC)    Tubular adenoma of colon    Uterine polyp     SURGICAL HISTORY: Past  Surgical History:  Procedure Laterality Date   APPENDECTOMY  1962   BREAST LUMPECTOMY  1989   left   CATARACT EXTRACTION, BILATERAL Bilateral 12/2013   COLONOSCOPY WITH PROPOFOL  N/A 04/15/2021   Procedure: COLONOSCOPY WITH PROPOFOL ;  Surgeon: Teressa Toribio SQUIBB, MD;  Location: [facility];  Service: Endoscopy;  Laterality: N/A;   ENTEROSCOPY N/A 01/18/2016   Procedure: ENTEROSCOPY;  Surgeon: Gustav Shila GAILS, MD;  Location: [facility];  Service: Endoscopy;  Laterality: N/A;   ENTEROSCOPY N/A 05/24/2018   Procedure: ENTEROSCOPY;  Surgeon: Charlanne Groom, MD;  Location: [facility];  Service: Endoscopy;  Laterality: N/A;   ENTEROSCOPY N/A 03/29/2021    Procedure: ENTEROSCOPY;  Surgeon: Albertus Gordy HERO, MD;  [facility]: [facility];  Service: Gastroenterology;  Laterality: N/A;   ENTEROSCOPY N/A 04/13/2021   Procedure: ENTEROSCOPY;  Surgeon: Leigh Elspeth SQUIBB, MD;  [facility];  Service: Gastroenterology;  Laterality: N/A;   ESOPHAGOGASTRODUODENOSCOPY (EGD) WITH PROPOFOL  N/A 12/21/2015   Procedure: ESOPHAGOGASTRODUODENOSCOPY (EGD) WITH PROPOFOL ;  Surgeon: Norleen LOISE Kiang, MD;  [facility];  Service: Endoscopy;  Laterality: N/A;   HOT HEMOSTASIS N/A 05/24/2018   Procedure: HOT HEMOSTASIS (ARGON PLASMA COAGULATION/BICAP);  Surgeon: Charlanne Groom, MD;  Location: [facility];  Service: Endoscopy;  Laterality: N/A;   HOT HEMOSTASIS N/A 03/29/2021   Procedure: HOT HEMOSTASIS (ARGON PLASMA COAGULATION/BICAP);  Surgeon: Albertus Gordy HERO, MD;  Location: [facility];  Service: Gastroenterology;  Laterality: N/A;   HOT HEMOSTASIS N/A 04/13/2021   Procedure: HOT HEMOSTASIS (ARGON PLASMA COAGULATION/BICAP);  Surgeon: Leigh Elspeth SQUIBB, MD;  Location: [facility];  Service: Gastroenterology;  Laterality: N/A;   INTRAMEDULLARY (IM) NAIL INTERTROCHANTERIC Left 10/11/2022   Procedure: INTRAMEDULLARY (IM) NAIL INTERTROCHANTERIC;  Surgeon: Melodi Lerner, MD;  [facility]: [facility];  Service: Orthopedics;  Laterality: Left;   IR GENERIC HISTORICAL  06/05/2016   IR RADIOLOGIST EVAL & MGMT 06/05/2016 Marcey Moan, MD [facility] INTERV RAD   IVC Filter     KIDNEY SURGERY     left -laser surgery by Dr. moan- 5 yrs ago no removal   RIGHT/LEFT HEART CATH AND CORONARY ANGIOGRAPHY N/A 04/25/2022   Procedure: RIGHT/LEFT HEART CATH AND CORONARY ANGIOGRAPHY;  Surgeon: Wonda Sharper, MD;  Location: [facility] INVASIVE CV LAB;  Service: Cardiovascular;  Laterality: N/A;   SCHLEROTHERAPY  05/24/2018   Procedure: WALDEMAR;  Surgeon: Charlanne Groom, MD;  [facility]: [facility];  Service: Endoscopy;;   SUBMUCOSAL TATTOO INJECTION  04/13/2021   Procedure: SUBMUCOSAL  TATTOO INJECTION;  Surgeon: Leigh Elspeth SQUIBB, MD;  [facility];  Service: Gastroenterology;;   TONSILLECTOMY     TUBAL LIGATION      SOCIAL HISTORY: Social History   Socioeconomic History   Marital status: Married    Spouse name: Not on file   Number of children: 2   Years of education: Not on file   Highest education level: Not on file  Occupational History   Occupation: Retired  Tobacco Use   Smoking status: Never   Smokeless tobacco: Never  Vaping Use   Vaping status: Never Used  Substance and Sexual Activity   Alcohol use: No   Drug use: No   Sexual activity: Not Currently  Other Topics Concern   Not on file  Social History Narrative   Married '611 son- '65, 1 daughter '63; 6 children (2 adopted)SO- SOBRetirement- doing well. Marriage in good health. Patient has never smoked. Alcohol use- noPt gets regular exercise   Social Drivers of Health   Financial Resource Strain: Low Risk  (  04/02/2022)   Overall Financial Resource Strain (CARDIA)    Difficulty of Paying Living Expenses: Not hard at all  Food Insecurity: No Food Insecurity (09/04/2023)   Hunger Vital Sign    Worried About Running Out of Food in the Last Year: Never true    Ran Out of Food in the Last Year: Never true  Transportation Needs: No Transportation Needs (09/04/2023)   PRAPARE - Administrator, Civil Service (Medical): No    Lack of Transportation (Non-Medical): No  Physical Activity: Inactive (04/02/2022)   Exercise Vital Sign    Days of Exercise per Week: 0 days    Minutes of Exercise per Session: 0 min  Stress: No Stress Concern Present (04/02/2022)   [employer] of Occupational Health - Occupational Stress Questionnaire    Feeling of Stress : Not at all  Social Connections: Socially Integrated (04/02/2022)   Social Connection and Isolation Panel [NHANES]    Frequency of Communication with Friends and Family: More than three times a week    Frequency of Social  Gatherings with Friends and Family: More than three times a week    Attends Religious Services: More than 4 times per year    Active Member of [employer] or Organizations: Yes    Attends Engineer, Structural: More than 4 times per year    Marital Status: Married  Catering Manager Violence: Not At Risk (09/04/2023)   Humiliation, Afraid, Rape, and Kick questionnaire    Fear of Current or Ex-Partner: No    Emotionally Abused: No    Physically Abused: No    Sexually Abused: No    FAMILY HISTORY: Family History  Problem Relation Age of Onset   Bladder Cancer Father    Diabetes Father    Prostate cancer Father    Alzheimer's disease Mother    Diabetes Sister    Lung cancer Sister         smoker   Esophageal cancer Paternal Uncle    Colon cancer Neg Hx     ALLERGIES:  is allergic to codeine , other, erythromycin, iodinated contrast media, lisinopril, mirtazapine , mycophenolate mofetil, and sulfa  antibiotics.  MEDICATIONS:  Current Outpatient Medications  Medication Sig Dispense Refill   acetaminophen  (TYLENOL ) 325 MG tablet Take 2 tablets (650 mg total) by mouth every 6 (six) hours as needed for moderate pain. 30 tablet 0   albuterol  (PROVENTIL ) (2.5 MG/3ML) 0.083% nebulizer solution Take 3 mLs (2.5 mg total) by nebulization every 6 (six) hours as needed for wheezing or shortness of breath. 75 mL 12   albuterol  (VENTOLIN  HFA) 108 (90 Base) MCG/ACT inhaler INHALE 2 PUFFS INTO THE LUNGS EVERY 6 HOURS AS NEEDED FOR WHEEZING OR SHORTNESS OF BREATH (Patient taking differently: Inhale 2 puffs into the lungs every 6 (six) hours as needed for wheezing or shortness of breath.) 6.7 g 3   arformoterol  (BROVANA ) 15 MCG/2ML NEBU Take 2 mLs (15 mcg total) by nebulization 2 (two) times daily. 120 mL 6   augmented betamethasone dipropionate (DIPROLENE-AF) 0.05 % cream Apply 1 application  topically 2 (two) times daily as needed (for irritation).     clobetasol ointment (TEMOVATE) 0.05 % Apply 1  Application topically 2 (two) times daily as needed (for irritation).     diltiazem  (CARDIZEM ) 30 MG tablet Take 1 tablet (30 mg total) by mouth every 12 (twelve) hours. 60 tablet 2   docusate sodium  (COLACE) 100 MG capsule Take 1 capsule (100 mg total) by mouth 2 (two) times daily. (  Patient taking differently: Take 100 mg by mouth in the morning.) 10 capsule 0   empagliflozin  (JARDIANCE ) 10 MG TABS tablet Take 1 tablet (10 mg total) by mouth daily. 30 tablet 3   famotidine  (PEPCID ) 20 MG tablet Take 1 tablet (20 mg total) by mouth at bedtime. 30 tablet 11   fluticasone  (FLONASE ) 50 MCG/ACT nasal spray USE 1 SPRAY(S) IN EACH NOSTRIL ONCE DAILY AS NEEDED FOR ALLERGIES 16 g 0   furosemide  (LASIX ) 40 MG tablet Take 1 tablet (40 mg total) by mouth daily. 30 tablet 2   guaiFENesin  (MUCINEX ) 600 MG 12 hr tablet Take 600 mg by mouth 2 (two) times daily as needed.     ipratropium (ATROVENT ) 0.03 % nasal spray Place 2 sprays into both nostrils every 12 (twelve) hours. (Patient taking differently: Place 2 sprays into both nostrils at bedtime.) 30 mL 12   latanoprost  (XALATAN ) 0.005 % ophthalmic solution Place 1 drop into both eyes at bedtime.     magic mouthwash SOLN Take 10 mLs by mouth 4 (four) times daily as needed for mouth pain. 15 mL 2   mupirocin  ointment (BACTROBAN ) 2 % Apply 1 Application topically 2 (two) times daily as needed (for irritation).     Nutritional Supplements (ENSURE HIGH PROTEIN) LIQD Take 0.5 Bottles by mouth daily as needed (for supplementation).     OXYGEN  Inhale 2-3 L/min into the lungs continuous.     pantoprazole  (PROTONIX ) 40 MG tablet TAKE 1 TABLET BY MOUTH TWICE DAILY BEFORE A MEAL 180 tablet 0   polyethylene glycol (MIRALAX  / GLYCOLAX ) 17 g packet Take 17 g by mouth daily. 14 each 0   revefenacin  (YUPELRI ) 175 MCG/3ML nebulizer solution Take 3 mLs (175 mcg total) by nebulization daily. 90 mL 6   spironolactone  (ALDACTONE ) 25 MG tablet Take 1 tablet (25 mg total) by mouth  daily. 90 tablet 3   sucralfate  (CARAFATE ) 1 GM/10ML suspension TAKE  10 ML BY MOUTH EVERY 6 HOURS AS NEEDED FOR REFLUX 420 mL 0   triamcinolone  cream (KENALOG ) 0.1 % Apply 1 Application topically See admin instructions. Apply to affected leg(s) after showers and up to 2 additional times a day as needed for irritation     No current facility-administered medications for this visit.    REVIEW OF SYSTEMS:   Constitutional: ( - ) fevers, ( - )  chills , ( - ) night sweats Eyes: ( - ) blurriness of vision, ( - ) double vision, ( - ) watery eyes Ears, nose, mouth, throat, and face: ( - ) mucositis, ( - ) sore throat Respiratory: ( - ) cough, ( + ) dyspnea, ( - ) wheezes Cardiovascular: ( - ) palpitation, ( - ) chest discomfort, ( - ) lower extremity swelling Gastrointestinal:  ( - ) nausea, ( - ) heartburn, ( - ) change in bowel habits Skin: ( - ) abnormal skin rashes Lymphatics: ( - ) new lymphadenopathy, ( - ) easy bruising Neurological: ( - ) numbness, ( - ) tingling, ( - ) new weaknesses Behavioral/Psych: ( - ) mood change, ( - ) new changes  All other systems were reviewed with the patient and are negative.  PHYSICAL EXAMINATION: ECOG PERFORMANCE STATUS: 1 - Symptomatic but completely ambulatory  There were no vitals filed for this visit.   There were no vitals filed for this visit.    GENERAL: well appearing elderly female in NAD  SKIN: skin color, texture, turgor are normal, no rashes or significant lesions EYES:  conjunctiva are pink and non-injected, sclera clear LUNGS: clear to auscultation and percussion with normal breathing effort HEART: regular rate & rhythm and no murmurs and no lower extremity edema Musculoskeletal: no cyanosis of digits and no clubbing  PSYCH: alert & oriented x 3, fluent speech NEURO: no focal motor/sensory deficits  LABORATORY DATA:  I have reviewed the data as listed    Latest Ref Rng & Units 11/26/2023   10:52 AM 11/06/2023    2:32 PM  10/09/2023    2:01 PM  CBC  WBC 4.0 - 10.5 K/uL 5.3  5.3  5.8   Hemoglobin 12.0 - 15.0 g/dL 9.9  9.8  89.2   Hematocrit 36.0 - 46.0 % 31.8  30.3  33.1   Platelets 150 - 400 K/uL 167  153  173        Latest Ref Rng & Units 11/26/2023   10:52 AM 11/06/2023    2:32 PM 10/30/2023    2:34 PM  CMP  Glucose 70 - 99 mg/dL 84  878  895   BUN 8 - 23 mg/dL 63  57  48   Creatinine 0.44 - 1.00 mg/dL 8.29  8.64  8.60   Sodium 135 - 145 mmol/L 140  139  145   Potassium 3.5 - 5.1 mmol/L 3.9  4.1  4.7   Chloride 98 - 111 mmol/L 94  94  96   CO2 22 - 32 mmol/L 39  37  33   Calcium  8.9 - 10.3 mg/dL 9.2  9.8  9.5   Total Protein 6.5 - 8.1 g/dL 7.2  6.8    Total Bilirubin 0.0 - 1.2 mg/dL 0.6  0.6    Alkaline Phos 38 - 126 U/L 73  69    AST 15 - 41 U/L 24  26    ALT 0 - 44 U/L 14  14     RADIOGRAPHIC STUDIES: I have personally reviewed the radiological images as listed and agreed with the findings in the report. No results found.  ASSESSMENT & PLAN Paula Ward is a 88 y.o. female who presents to the hematology clinic for evaluation of macrocytic anemia  #Macrocytic anemia: #Vitamin B12 deficiency: --Etiology unknown but chronic in nature. CKD is part of differential.  --Iron  panel from 12/30/2022 reviewed without deficiency.  --Patient has retacrit  10,000 units q 4 weeks, last received 12/30/2022. --Workup from 02/12/2023 didn't reveal hemolysis, paraproteinemia, folate deficiency. There was evidence of vitamin B12 deficiency so patient is currently on vitamin B12 SL replacement every other day.   --Labs today show persistent anemia with Hgb 9.9, white blood cell count 5.3, MCV 112.4, platelets 167 --bone marrow biopsy on 08/14/2023 showed no clear cause for the macrocytosis, though excessive iron  deposition was noted. HFE genetic testing was negative in November 2024 -- no evidence of cirrhosis on prior imaging from 2023 (Chest imaging, but liver visualized). Can consider liver imaging pending  results of today's studies.  --RTC q 4 weeks for labs/injection with clinic visit in 3 months time.   No orders of the defined types were placed in this encounter.   All questions were answered. The patient knows to call the clinic with any problems, questions or concerns.  I have spent a total of 30 minutes minutes of face-to-face and non-face-to-face time, preparing to see the patient, obtaining and/or reviewing separately obtained history, performing a medically appropriate examination, counseling and educating the patient, ordering tests/procedures,documenting clinical information in the electronic health record, and care coordination.  Norleen IVAR Kidney, MD Department of Hematology/Oncology [facility] at [facility] Phone: [phone] Pager: [phone] Email: [email or web address]

## 2023-11-30 LAB — METHYLMALONIC ACID, SERUM: Methylmalonic Acid, Quantitative: 683 nmol/L — ABNORMAL HIGH (ref 0–378)

## 2023-12-02 ENCOUNTER — Ambulatory Visit: Payer: Self-pay

## 2023-12-02 NOTE — Patient Outreach (Signed)
  Care Coordination   Follow Up Visit Note   12/02/2023 Name: Paula Ward MRN: [id number] DOB: December 31, 1935  Paula Ward is a 88 y.o. year old female who sees Paula Broker, MD for primary care. I spoke with  Paula Ward by phone today.  What matters to the patients health and wellness today?  Patient reports, "I am doing pretty good overall, as long as I don't exert myself too much". She states in home care assistance prepares meals. In addition, Paula Ward states she is drinking protein supplements. She continues to wear her oxygen at 3l/[place] and states she will increase to 4l/[place] with exertion. She denies any questions or concerns at this time. Last office visit with pulmonologist 11/11/23.  Goals Addressed             This Visit's Progress    Assist with management of Health Conditions       Interventions Today    Flowsheet Row Most Recent Value  Chronic Disease   Chronic disease during today's visit Other, Hypertension (HTN), Congestive Heart Failure (CHF), Chronic Kidney Disease/End Stage Renal Disease (ESRD)  [ILD, pulmonary htn]  General Interventions   General Interventions Discussed/Reviewed General Interventions Reviewed, Doctor Visits  [Evaluation of current treatment plan for health condition and patient's adherence to plan.]  Doctor Visits Discussed/Reviewed Doctor Visits Reviewed, PCP, Specialist  PCP/Specialist Visits Compliance with follow-up visit  [reviewed upcoming/scheduled appointments with patient]  Exercise Interventions   Exercise Discussed/Reviewed Physical Activity  [encouraged to remain as active as possible allowing time for rest]  Education Interventions   Education Provided Provided Education  Provided Verbal Education On When to see the doctor, Mental Health/Coping with Illness, Nutrition, Medication  [advised to take medications as prescribed, discussed importance of monitoring self for worsening symptoms and notify provider if any  signs/symptoms]  Nutrition Interventions   Nutrition Discussed/Reviewed Supplemental nutrition, Nutrition Reviewed  Pharmacy Interventions   Pharmacy Dicussed/Reviewed Pharmacy Topics Reviewed  [medications reviewed]  Safety Interventions   Safety Discussed/Reviewed Safety Discussed, Home Safety  [patient lives with spouse, has in home care as needed]  Advanced Directive Interventions   Advanced Directives Discussed/Reviewed End of Life  End of Life Palliative, Hospice  [briefly discussed difference between palliaitive care and hospice. patient states will discuss palliative care and hospice program with PCP at upcoming visit.]            SDOH assessments and interventions completed:  No  Care Coordination Interventions:  Yes, provided   Follow up plan: Follow up call scheduled for 12/31/23    Encounter Outcome:  Patient Visit Completed   Kathyrn Sheriff, RN, MSN, BSN, CCM [place]  [facility], Population Health Case Manager Phone: [phone]

## 2023-12-02 NOTE — Patient Instructions (Signed)
Visit Information  Thank you for taking time to visit with me today. Please don't hesitate to contact me if I can be of assistance to you.   Following are the goals we discussed today:  Continue to take medications as prescribed. Continue to attend provider visits as scheduled Continue to eat healthy, lean meats, vegetables, fruits, avoid saturated and transfats Contact provider with health questions or concerns as needed  Our next appointment is by telephone on 12/31/23 at 2:15 pm  Please call the care guide team at [phone] if you need to cancel or reschedule your appointment.   If you are experiencing a Mental Health or Behavioral Health Crisis or need someone to talk to, please call the Suicide and Crisis Lifeline: 988 call the [place] National Suicide Prevention Lifeline: 727-341-9520 or TTY: (559) 694-5959 TTY 3098722821) to talk to a trained counselor  Kathyrn Sheriff, RN, MSN, BSN, CCM [place]  [facility], Population Health Case Manager Phone: [phone]

## 2023-12-03 ENCOUNTER — Other Ambulatory Visit: Payer: Self-pay | Admitting: Internal Medicine

## 2023-12-03 ENCOUNTER — Telehealth: Payer: Self-pay | Admitting: Cardiology

## 2023-12-03 ENCOUNTER — Ambulatory Visit (HOSPITAL_COMMUNITY): Payer: Medicare PPO | Attending: Cardiology

## 2023-12-03 DIAGNOSIS — I3139 Other pericardial effusion (noninflammatory): Secondary | ICD-10-CM | POA: Insufficient documentation

## 2023-12-03 DIAGNOSIS — I509 Heart failure, unspecified: Secondary | ICD-10-CM | POA: Diagnosis not present

## 2023-12-03 DIAGNOSIS — I350 Nonrheumatic aortic (valve) stenosis: Secondary | ICD-10-CM | POA: Diagnosis not present

## 2023-12-03 LAB — ECHOCARDIOGRAM COMPLETE
AR max vel: 1.08 cm2
AV Area VTI: 1.17 cm2
AV Area mean vel: 1.05 cm2
AV Mean grad: 21 mm[Hg]
AV Peak grad: 39.4 mm[Hg]
Ao pk vel: 3.14 m/s
Area-P 1/2: 5.34 cm2
MV VTI: 1.57 cm2
S' Lateral: 2 cm

## 2023-12-03 NOTE — Telephone Encounter (Signed)
I have spoken with Mrs. Paula Ward tonight regarding her pericardial effusion.  She has been stable with her weight and now current LE edema.  She has not had any dizziness or syncope.  She has chronic SOB that is very stable.  Denies any chest pain.  Her BP has been running systolic.  She was not tachycardia on exam today.  Her HR is in the 80's.  I have told her that if she starts to feel bad she needs to go to the ER.  Otherwise, I will have the office call her in am to get her in with DOD tomorrow 2/14.

## 2023-12-04 ENCOUNTER — Encounter (HOSPITAL_COMMUNITY): Payer: Self-pay

## 2023-12-04 ENCOUNTER — Emergency Department (HOSPITAL_COMMUNITY)
Admission: EM | Admit: 2023-12-04 | Discharge: 2023-12-04 | Disposition: A | Discharge status: Home / Self Care | Payer: Medicare PPO | Attending: Emergency Medicine | Admitting: Emergency Medicine

## 2023-12-04 ENCOUNTER — Other Ambulatory Visit: Payer: Self-pay

## 2023-12-04 ENCOUNTER — Emergency Department (HOSPITAL_COMMUNITY): Payer: Medicare PPO

## 2023-12-04 ENCOUNTER — Ambulatory Visit: Payer: Medicare PPO | Admitting: Cardiovascular Disease

## 2023-12-04 DIAGNOSIS — D638 Anemia in other chronic diseases classified elsewhere: Secondary | ICD-10-CM | POA: Diagnosis present

## 2023-12-04 DIAGNOSIS — R0602 Shortness of breath: Secondary | ICD-10-CM | POA: Diagnosis not present

## 2023-12-04 DIAGNOSIS — M349 Systemic sclerosis, unspecified: Secondary | ICD-10-CM | POA: Diagnosis present

## 2023-12-04 DIAGNOSIS — I517 Cardiomegaly: Secondary | ICD-10-CM | POA: Diagnosis not present

## 2023-12-04 DIAGNOSIS — N189 Chronic kidney disease, unspecified: Secondary | ICD-10-CM | POA: Diagnosis not present

## 2023-12-04 DIAGNOSIS — N1832 Chronic kidney disease, stage 3b: Secondary | ICD-10-CM | POA: Diagnosis present

## 2023-12-04 DIAGNOSIS — D509 Iron deficiency anemia, unspecified: Secondary | ICD-10-CM | POA: Diagnosis present

## 2023-12-04 DIAGNOSIS — K219 Gastro-esophageal reflux disease without esophagitis: Secondary | ICD-10-CM | POA: Diagnosis present

## 2023-12-04 DIAGNOSIS — Z79899 Other long term (current) drug therapy: Secondary | ICD-10-CM | POA: Diagnosis not present

## 2023-12-04 DIAGNOSIS — J811 Chronic pulmonary edema: Secondary | ICD-10-CM | POA: Diagnosis not present

## 2023-12-04 DIAGNOSIS — R778 Other specified abnormalities of plasma proteins: Secondary | ICD-10-CM | POA: Diagnosis not present

## 2023-12-04 DIAGNOSIS — I3139 Other pericardial effusion (noninflammatory): Secondary | ICD-10-CM | POA: Diagnosis not present

## 2023-12-04 DIAGNOSIS — J9611 Chronic respiratory failure with hypoxia: Secondary | ICD-10-CM | POA: Diagnosis present

## 2023-12-04 DIAGNOSIS — J849 Interstitial pulmonary disease, unspecified: Secondary | ICD-10-CM | POA: Diagnosis present

## 2023-12-04 DIAGNOSIS — Z86711 Personal history of pulmonary embolism: Secondary | ICD-10-CM | POA: Diagnosis present

## 2023-12-04 DIAGNOSIS — J90 Pleural effusion, not elsewhere classified: Secondary | ICD-10-CM | POA: Diagnosis not present

## 2023-12-04 DIAGNOSIS — Z853 Personal history of malignant neoplasm of breast: Secondary | ICD-10-CM | POA: Insufficient documentation

## 2023-12-04 DIAGNOSIS — Z86718 Personal history of other venous thrombosis and embolism: Secondary | ICD-10-CM

## 2023-12-04 DIAGNOSIS — R0789 Other chest pain: Secondary | ICD-10-CM | POA: Diagnosis not present

## 2023-12-04 DIAGNOSIS — I10 Essential (primary) hypertension: Secondary | ICD-10-CM | POA: Diagnosis present

## 2023-12-04 DIAGNOSIS — Z9981 Dependence on supplemental oxygen: Secondary | ICD-10-CM

## 2023-12-04 DIAGNOSIS — I5032 Chronic diastolic (congestive) heart failure: Secondary | ICD-10-CM | POA: Diagnosis present

## 2023-12-04 DIAGNOSIS — I350 Nonrheumatic aortic (valve) stenosis: Secondary | ICD-10-CM

## 2023-12-04 LAB — CBC WITH DIFFERENTIAL/PLATELET
Abs Immature Granulocytes: 0 10*3/uL (ref 0.00–0.07)
Basophils Absolute: 0.1 10*3/uL (ref 0.0–0.1)
Basophils Relative: 1 %
Eosinophils Absolute: 0.2 10*3/uL (ref 0.0–0.5)
Eosinophils Relative: 3 %
HCT: 31.4 % — ABNORMAL LOW (ref 36.0–46.0)
Hemoglobin: 9.8 g/dL — ABNORMAL LOW (ref 12.0–15.0)
Lymphocytes Relative: 10 %
Lymphs Abs: 0.6 10*3/uL — ABNORMAL LOW (ref 0.7–4.0)
MCH: 35.5 pg — ABNORMAL HIGH (ref 26.0–34.0)
MCHC: 31.2 g/dL (ref 30.0–36.0)
MCV: 113.8 fL — ABNORMAL HIGH (ref 80.0–100.0)
Monocytes Absolute: 0.4 10*3/uL (ref 0.1–1.0)
Monocytes Relative: 6 %
Neutro Abs: 4.8 10*3/uL (ref 1.7–7.7)
Neutrophils Relative %: 80 %
Platelets: 192 10*3/uL (ref 150–400)
RBC: 2.76 MIL/uL — ABNORMAL LOW (ref 3.87–5.11)
RDW: 12.4 % (ref 11.5–15.5)
WBC: 6 10*3/uL (ref 4.0–10.5)
nRBC: 0 % (ref 0.0–0.2)
nRBC: 0 /100{WBCs}

## 2023-12-04 LAB — COMPREHENSIVE METABOLIC PANEL
ALT: 16 U/L (ref 0–44)
AST: 27 U/L (ref 15–41)
Albumin: 3.9 g/dL (ref 3.5–5.0)
Alkaline Phosphatase: 60 U/L (ref 38–126)
Anion gap: 14 (ref 5–15)
BUN: 62 mg/dL — ABNORMAL HIGH (ref 8–23)
CO2: 33 mmol/L — ABNORMAL HIGH (ref 22–32)
Calcium: 9.3 mg/dL (ref 8.9–10.3)
Chloride: 91 mmol/L — ABNORMAL LOW (ref 98–111)
Creatinine, Ser: 1.55 mg/dL — ABNORMAL HIGH (ref 0.44–1.00)
GFR, Estimated: 33 mL/min — ABNORMAL LOW (ref >60)
Glucose, Bld: 89 mg/dL (ref 70–99)
Potassium: 4.5 mmol/L (ref 3.5–5.1)
Sodium: 138 mmol/L (ref 135–145)
Total Bilirubin: 0.8 mg/dL (ref 0.0–1.2)
Total Protein: 6.9 g/dL (ref 6.5–8.1)

## 2023-12-04 LAB — TROPONIN I (HIGH SENSITIVITY)
Troponin I (High Sensitivity): 47 ng/L — ABNORMAL HIGH (ref <18)
Troponin I (High Sensitivity): 45 ng/L — ABNORMAL HIGH (ref <18)

## 2023-12-04 LAB — BRAIN NATRIURETIC PEPTIDE: B Natriuretic Peptide: 1686.5 pg/mL — ABNORMAL HIGH (ref 0.0–100.0)

## 2023-12-04 MED ORDER — DILTIAZEM HCL 30 MG PO TABS: 30.0000 mg | ORAL_TABLET | Freq: Two times a day (BID) | ORAL | 11 refills | Status: DC

## 2023-12-04 NOTE — ED Notes (Signed)
Paperwork reviewed verbalized understanding.

## 2023-12-04 NOTE — ED Provider Notes (Signed)
[place] EMERGENCY DEPARTMENT AT [facility] Provider Note   CSN: [id number] Arrival date & time: 12/04/23  1323     History  Chief Complaint  Patient presents with   Chest Pain    Paula Ward is a 88 y.o. female.  Your pericardial effusion  The history is provided by the patient and medical records. No language interpreter was used.  Chest Pain    88 year old female history of crest syndrome, breast cancer, CKD, pericardial effusion, GERD, interstitial lung disease, anemia sent here per recommendation of cardiologist with concerns of pericardial effusion.  Patient states she had an echocardiogram done yesterday and today her doctor told her to come to the ER to be evaluated for her pericardial effusion.  Patient mention she has had pericardial effusion for 20 years and it has not bothered her.  She is without any new complaint.  She mention she has problems with peripheral edema since December of last year however it has markedly improved and her weight is back to normal with medication.  She endorsed some mild shortness of breath with exertion but this is not new for her.  She is on home O2 at 2 L.  She denies any active chest pain aside from occasional pain in the right side of her ribs.  She does not endorse any fever or productive cough lightheadedness or dizziness nausea vomiting or back pain.  Home Medications Prior to Admission medications   Medication Sig Start Date End Date Taking? Authorizing Provider  acetaminophen (TYLENOL) 325 MG tablet Take 2 tablets (650 mg total) by mouth every 6 (six) hours as needed for moderate pain. 07/18/22   Wallis Bamberg, PA-C  albuterol (PROVENTIL) (2.5 MG/3ML) 0.083% nebulizer solution Take 3 mLs (2.5 mg total) by nebulization every 6 (six) hours as needed for wheezing or shortness of breath. 09/03/23   Pokhrel, Rebekah Chesterfield, MD  albuterol (VENTOLIN HFA) 108 (90 Base) MCG/ACT inhaler INHALE 2 PUFFS INTO THE LUNGS EVERY 6 HOURS AS NEEDED  FOR WHEEZING OR SHORTNESS OF BREATH Patient taking differently: Inhale 2 puffs into the lungs every 6 (six) hours as needed for wheezing or shortness of breath. 03/13/23   [name], MD  arformoterol (BROVANA) 15 MCG/2ML NEBU Take 2 mLs (15 mcg total) by nebulization 2 (two) times daily. 11/11/23   Hunsucker, Lesia Sago, MD  augmented betamethasone dipropionate (DIPROLENE-AF) 0.05 % cream Apply 1 application  topically 2 (two) times daily as needed (for irritation). 07/13/23   [provider]  clobetasol ointment (TEMOVATE) 0.05 % Apply 1 Application topically 2 (two) times daily as needed (for irritation). 04/20/23   [provider]  diltiazem (CARDIZEM) 30 MG tablet Take 1 tablet (30 mg total) by mouth every 12 (twelve) hours. 12/04/23   Ronney Asters, NP  docusate sodium (COLACE) 100 MG capsule Take 1 capsule (100 mg total) by mouth 2 (two) times daily. Patient taking differently: Take 100 mg by mouth in the morning. 10/15/22   Edmisten, Kristie L, PA  empagliflozin (JARDIANCE) 10 MG TABS tablet Take 1 tablet (10 mg total) by mouth daily. 08/16/23   Lewie Chamber, MD  famotidine (PEPCID) 20 MG tablet Take 1 tablet (20 mg total) by mouth at bedtime. 02/10/23   Napoleon Form, MD  fluticasone (FLONASE) 50 MCG/ACT nasal spray USE 1 SPRAY(S) IN EACH NOSTRIL ONCE DAILY AS NEEDED FOR ALLERGIES 12/04/23   [name], MD  furosemide (LASIX) 40 MG tablet Take 1 tablet (40 mg total) by  mouth daily. 11/04/23   Ronney Asters, NP  guaiFENesin (MUCINEX) 600 MG 12 hr tablet Take 600 mg by mouth 2 (two) times daily as needed.    [provider]  ipratropium (ATROVENT) 0.03 % nasal spray Place 2 sprays into both nostrils every 12 (twelve) hours. Patient taking differently: Place 2 sprays into both nostrils at bedtime. 05/12/22   Martina Sinner, MD  latanoprost (XALATAN) 0.005 % ophthalmic solution Place 1 drop into both eyes at bedtime. 09/23/22   [provider]  magic mouthwash SOLN Take 10 mLs by mouth 4 (four) times daily as needed for mouth pain. 03/22/22   Burnadette Pop, MD  mupirocin ointment (BACTROBAN) 2 % Apply 1 Application topically 2 (two) times daily as needed (for irritation).    [provider]  Nutritional Supplements (ENSURE HIGH PROTEIN) LIQD Take 0.5 Bottles by mouth daily as needed (for supplementation).    [provider]  OXYGEN Inhale 2-3 L/min into the lungs continuous.    [provider]  pantoprazole (PROTONIX) 40 MG tablet TAKE 1 TABLET BY MOUTH TWICE DAILY BEFORE A MEAL 10/19/23   Nandigam, Eleonore Chiquito, MD  polyethylene glycol (MIRALAX / GLYCOLAX) 17 g packet Take 17 g by mouth daily. Patient not taking: Reported on 12/02/2023 10/15/22   Edmisten, Lyn Hollingshead, PA  revefenacin (YUPELRI) 175 MCG/3ML nebulizer solution Take 3 mLs (175 mcg total) by nebulization daily. 11/11/23   Hunsucker, Lesia Sago, MD  spironolactone (ALDACTONE) 25 MG tablet Take 1 tablet (25 mg total) by mouth daily. 10/30/23   Ronney Asters, NP  sucralfate (CARAFATE) 1 GM/10ML suspension TAKE  10 ML BY MOUTH EVERY 6 HOURS AS NEEDED FOR REFLUX 09/25/23   Napoleon Form, MD  triamcinolone cream (KENALOG) 0.1 % Apply 1 Application topically See admin instructions. Apply to affected leg(s) after showers and up to 2 additional times a day as needed for irritation 07/07/23   [provider]      Allergies    Codeine, Other, Erythromycin, Iodinated contrast media, Lisinopril, Mirtazapine, Mycophenolate mofetil, and Sulfa antibiotics    Review of Systems   Review of Systems  Cardiovascular:  Positive for chest pain.  All other systems reviewed and are negative.   Physical Exam Updated Vital Signs BP (!) 126/58   Pulse 88   Temp 98 F (36.7 C) (Oral)   Resp 16   Ht 5\' 3"  (1.6 m)   Wt 46 kg   SpO2 100%   BMI 17.96 kg/m  Physical Exam Vitals and nursing note reviewed.  Constitutional:      General: She  is not in acute distress.    Appearance: She is well-developed.     Comments: Paula Ward elderly female resting comfortably in bed appears to be in no acute discomfort.  She is wearing supplemental oxygen.  HENT:     Head: Atraumatic.  Eyes:     Conjunctiva/sclera: Conjunctivae normal.  Cardiovascular:     Rate and Rhythm: Normal rate and regular rhythm.     Heart sounds: Murmur heard.     No friction rub.  Pulmonary:     Effort: Pulmonary effort is normal.     Breath sounds: Rales present.     Comments: Rales heard at lung bases Abdominal:     Palpations: Abdomen is soft.     Tenderness: There is no abdominal tenderness.  Musculoskeletal:     Cervical back: Neck supple.     Right lower leg: No edema.  Left lower leg: No edema.  Skin:    Findings: No rash.  Neurological:     Mental Status: She is alert. Mental status is at baseline.  Psychiatric:        Mood and Affect: Mood normal.     ED Results / Procedures / Treatments   Labs (all labs ordered are listed, but only abnormal results are displayed) Labs Reviewed  COMPREHENSIVE METABOLIC PANEL - Abnormal; Notable for the following components:      Result Value   Chloride 91 (*)    CO2 33 (*)    BUN 62 (*)    Creatinine, Ser 1.55 (*)    GFR, Estimated 33 (*)    All other components within normal limits  CBC WITH DIFFERENTIAL/PLATELET - Abnormal; Notable for the following components:   RBC 2.76 (*)    Hemoglobin 9.8 (*)    HCT 31.4 (*)    MCV 113.8 (*)    MCH 35.5 (*)    Lymphs Abs 0.6 (*)    All other components within normal limits  BRAIN NATRIURETIC PEPTIDE - Abnormal; Notable for the following components:   B Natriuretic Peptide 1,686.5 (*)    All other components within normal limits  TROPONIN I (HIGH SENSITIVITY) - Abnormal; Notable for the following components:   Troponin I (High Sensitivity) 47 (*)    All other components within normal limits  TROPONIN I (HIGH SENSITIVITY)    EKG EKG  Interpretation Date/Time:  Friday December 04 2023 16:27:49 EST Ventricular Rate:  80 PR Interval:  55 QRS Duration:  90 QT Interval:  372 QTC Calculation: 410 R Axis:   61  Text Interpretation: Sinus rhythm Atrial premature complex Short PR interval Low voltage, extremity leads Nonspecific T abnrm, anterolateral leads Borderline ST elevation, anterior leads Confirmed by Fulton Reek [phone]) on 12/04/2023 5:05:24 PM  Radiology ECHOCARDIOGRAM COMPLETE Result Date: 12/03/2023    ECHOCARDIOGRAM REPORT   Patient Name:   IONNA AVIS Date of Exam: 12/03/2023 Medical Rec #:  [id number]       Height:       63.0 in Accession #:    [id number]      Weight:       101.0 lb Date of Birth:  12-29-1935       BSA:          1.446 m Patient Age:    87 years        BP:           106/57 mmHg Patient Gender: F               HR:           94 bpm. Exam Location:  [address] Procedure: 2D Echo, Cardiac Doppler and Color Doppler (Both Spectral and Color            Flow Doppler were utilized during procedure). Indications:    I31.3 Pericardial Effusion  History:        Patient has prior history of Echocardiogram examinations.                 Pulmonary HTN, Aortic Valve Disease; Risk Factors:Hypertension.  Sonographer:    Clearence Ped RCS Referring Phys: [id number] BRIAN S CRENSHAW IMPRESSIONS  1. Left ventricular ejection fraction, by estimation, is 70 to 75%. The left ventricle has hyperdynamic function. The left ventricle has no regional wall motion abnormalities. There is severe concentric left ventricular hypertrophy. Left ventricular diastolic parameters are consistent with Grade I  diastolic dysfunction (impaired relaxation). Elevated left ventricular end-diastolic pressure.  2. Right ventricular systolic function is normal. The right ventricular size is normal.  3. There effusion measures 5.06cm at largest diameter posteriorly and 2.29cm anteriorly.. Large pericardial effusion. The pericardial effusion is circumferential.   4. The mitral valve is normal in structure. Mild mitral valve regurgitation. Mild to moderate mitral stenosis. The mean mitral valve gradient is 6.0 mmHg.  5. Tricuspid valve regurgitation is moderate.  6. The aortic valve is tricuspid. There is moderate calcification of the aortic valve. There is moderate thickening of the aortic valve. Aortic valve regurgitation is not visualized. Moderate aortic valve stenosis. Aortic valve area, by VTI measures 1.17 cm. Aortic valve mean gradient measures 21.0 mmHg. Aortic valve Vmax measures 3.14 m/s.  7. Compared to study dated 01/15/23 the pericardial effusion has increased in diameter posteriorly (4cm prior andnow 5cm) and there is now a 40mm respirophasic change in MV inflow velocity (2mm on prior echo). There is no overt diastolic RV collapse, but  both the RV and LV are very underfilled. There is also now moderate AS.  8. Case discussed with Dr. Jens Som who recommended patient come in to office 2/14 for DOD visit if asymptomatic today. FINDINGS  Left Ventricle: Left ventricular ejection fraction, by estimation, is 70 to 75%. The left ventricle has hyperdynamic function. The left ventricle has no regional wall motion abnormalities. Strain imaging was not performed. The left ventricular internal cavity size was normal in size. There is severe concentric left ventricular hypertrophy. Left ventricular diastolic parameters are consistent with Grade I diastolic dysfunction (impaired relaxation). Elevated left ventricular end-diastolic pressure. Right Ventricle: The right ventricular size is normal. No increase in right ventricular wall thickness. Right ventricular systolic function is normal. Left Atrium: Left atrial size was normal in size. Right Atrium: Right atrial size was normal in size. Pericardium: There effusion measures 5.06cm at largest diameter posteriorly and 2.29cm anteriorly. A large pericardial effusion is present. The pericardial effusion is circumferential.  There is diastolic collapse of the right atrial wall, diastolic collapse of the right ventricular free wall and excessive respiratory variation in the mitral valve spectral Doppler velocities. Mitral Valve: The mitral valve is normal in structure. Mild mitral valve regurgitation. Mild to moderate mitral valve stenosis. MV peak gradient, 12.1 mmHg. The mean mitral valve gradient is 6.0 mmHg. Tricuspid Valve: The tricuspid valve is normal in structure. Tricuspid valve regurgitation is moderate . No evidence of tricuspid stenosis. Aortic Valve: The aortic valve is tricuspid. There is moderate calcification of the aortic valve. There is moderate thickening of the aortic valve. Aortic valve regurgitation is not visualized. Moderate aortic stenosis is present. Aortic valve mean gradient measures 21.0 mmHg. Aortic valve peak gradient measures 39.4 mmHg. Aortic valve area, by VTI measures 1.17 cm. Pulmonic Valve: The pulmonic valve was normal in structure. Pulmonic valve regurgitation is mild. No evidence of pulmonic stenosis. Aorta: The aortic root is normal in size and structure. Venous: The inferior vena cava was not well visualized. IAS/Shunts: No atrial level shunt detected by color flow Doppler. Additional Comments: 3D imaging was not performed.  LEFT VENTRICLE PLAX 2D LVIDd:         2.70 cm   Diastology LVIDs:         2.00 cm   LV e' medial:    3.15 cm/s LV PW:         1.80 cm   LV E/e' medial:  37.5 LV IVS:  1.30 cm   LV e' lateral:   7.51 cm/s LVOT diam:     1.90 cm   LV E/e' lateral: 15.7 LV SV:         64 LV SV Index:   44 LVOT Area:     2.84 cm  RIGHT VENTRICLE RV Basal diam:  3.00 cm RV S prime:     17.20 cm/s TAPSE (M-mode): 1.8 cm RVSP:           66.4 mmHg LEFT ATRIUM              Index        RIGHT ATRIUM           Index LA diam:        3.90 cm  2.70 cm/m   RA Pressure: 3.00 mmHg LA Vol (A2C):   99.0 ml  68.45 ml/m  RA Area:     14.70 cm LA Vol (A4C):   95.2 ml  65.82 ml/m  RA Volume:   38.50 ml   26.62 ml/m LA Biplane Vol: 101.0 ml 69.83 ml/m  AORTIC VALVE AV Area (Vmax):    1.08 cm AV Area (Vmean):   1.05 cm AV Area (VTI):     1.17 cm AV Vmax:           314.00 cm/s AV Vmean:          210.000 cm/s AV VTI:            0.547 m AV Peak Grad:      39.4 mmHg AV Mean Grad:      21.0 mmHg LVOT Vmax:         120.00 cm/s LVOT Vmean:        78.100 cm/s LVOT VTI:          0.226 m LVOT/AV VTI ratio: 0.41  AORTA Ao Root diam: 3.10 cm Ao Asc diam:  3.10 cm MITRAL VALVE                TRICUSPID VALVE MV Area (PHT): 5.34 cm     TR Peak grad:   63.4 mmHg MV Area VTI:   1.57 cm     TR Vmax:        398.00 cm/s MV Peak grad:  12.1 mmHg    Estimated RAP:  3.00 mmHg MV Mean grad:  6.0 mmHg     RVSP:           66.4 mmHg MV Vmax:       1.74 m/s MV Vmean:      119.0 cm/s   SHUNTS MV Decel Time: 142 msec     Systemic VTI:  0.23 m MV E velocity: 118.00 cm/s  Systemic Diam: 1.90 cm MV A velocity: 167.00 cm/s MV E/A ratio:  0.71 Armanda Magic MD Electronically signed by Armanda Magic MD Signature Date/Time: 12/03/2023/8:22:40 PM    Final     Procedures Procedures    Medications Ordered in ED Medications - No data to display  ED Course/ Medical Decision Making/ A&P                                 Medical Decision Making Risk Decision regarding hospitalization.   BP (!) 126/58   Pulse 88   Temp 98 F (36.7 C) (Oral)   Resp 16   Ht 5\' 3"  (1.6 m)   Wt 46 kg   SpO2 100%  BMI 17.96 kg/m   44:33 PM  88 year old female history of crest syndrome, breast cancer, CKD, pericardial effusion, GERD, interstitial lung disease, anemia sent here per recommendation of cardiologist with concerns of pericardial effusion.  Patient states she had an echocardiogram done yesterday and today her doctor told her to come to the ER to be evaluated for her pericardial effusion.  Patient mention she has had pericardial effusion for 20 years and it has not bothered her.  She is without any new complaint.  She mention she has problems  with peripheral edema since December of last year however it has markedly improved and her weight is back to normal with medication.  She endorsed some mild shortness of breath with exertion but this is not new for her.  She is on home O2 at 2 L.  She denies any active chest pain aside from occasional pain in the right side of her ribs.  She does not endorse any fever or productive cough lightheadedness or dizziness nausea vomiting or back pain.  On exam, elderly female resting comfortably appears to be in no acute discomfort.  She is wearing supplemental oxygen.  She is able to speak in complete sentences.  Heart exam notable for systolic murmur without any obvious friction rub.  Lungs with crackles heard at lung bases.  No peripheral edema noted.  No JVD.  Left breast is engorged compared to right, this is chronic.  EMR review, patient's cardiologist Dr. Carolanne Grumbling recommend patient to come to the ER for pericardiocentesis due to her pericardial effusion.  -Labs ordered, independently viewed and interpreted by me.  Labs remarkable for trop 47.  Cr 1.55, improves from prior.  Hgb 9.8.  BNP 1686, improves from prior -The patient was maintained on a cardiac monitor.  I personally viewed and interpreted the cardiac monitored which showed an underlying rhythm of: sinus rhythm, no electrical alternans -Imaging independently viewed and interpreted by me and I agree with radiologist's interpretation.  Result remarkable for CXR showing persistent cardiomegaly with associated underlying pericardial effusion.  Mild pulmonary edema -This patient presents to the ED for concern of abnormal echo, this involves an extensive number of treatment options, and is a complaint that carries with it a high risk of complications and morbidity.  The differential diagnosis includes pericardial effusion, valvular disease, chf, acs -Co morbidities that complicate the patient evaluation includes chf, anemia, pericardial effusion,  breast CA -Treatment includes monitoring -Reevaluation of the patient after these medicines showed that the patient stayed the same -PCP office notes or outside notes reviewed -Discussion with specialist including cardiology team who recommend medicine admission and they will follow along.  I appreciate consultation from [facility] who agrees to see and will admit pt. shortly after, cardiologist Dr. Clifton James did evaluate patient and felt that her pericardial effusion is chronic in nature and patient is without any symptom requiring hospital admission.  He felt patient is stable to go home.  Hospitalist Dr. Alinda Money was notified and I will discharge patient as appropriate. Care discussed with DR. Davis.  -Escalation to admission/observation considered: patients feels much better, is comfortable with discharge, and will follow up with PCP -Prescription medication considered, patient comfortable with home medication -Social Determinant of Health considered          Final Clinical Impression(s) / ED Diagnoses Final diagnoses:  Pericardial effusion  Elevated troponin    Rx / DC Orders ED Discharge Orders     None  Fayrene Helper, PA-C 12/04/23 1820    Laurence Spates, MD 12/04/23 [phone]

## 2023-12-04 NOTE — Telephone Encounter (Signed)
LVM for pt that we scheduled her for DOD appt at 3 PM today 2/14.

## 2023-12-04 NOTE — ED Triage Notes (Signed)
Pt sent to ED by cardiologist for pericardial effusion. Pt c/o rib pain on right side. Pt has intermittent shortness of breath.

## 2023-12-04 NOTE — Telephone Encounter (Signed)
Dr. Eden Emms (DOD 2/14) suggests pt going straight to ED d/t her needing a pericardiocentesis. LVM explaining this for the pt and asked for her to call back to let us know she got our message and recommendations. Called Trish at [facility] to give her pt info and let her know the plan.

## 2023-12-04 NOTE — Consult Note (Signed)
Cardiology Consultation   Patient ID: Paula Ward MRN: [id number]; DOB: 1936-07-15  Admit date: 12/04/2023 Date of Consult: 12/04/2023  PCP:  Paula Broker, MD   [facility] Providers Cardiologist:  Paula Millers, MD   {   History of Present Illness:   Paula Ward is a 88 yo female with history of HTN, Pulmonary HTN, chronic diastolic CHF, GERD, CKD, aortic stenosis, CREST syndrome, AVM, breast cancer, interstitial lung disease, renal cell carcinoma who has a history of chronic, large pericardial effusion. Her pericardial effusion dates back 20 years per the patient. I can see echo reports that date back to 2017. Echo in our office yesterday showed LVEF=70-75%. Severe LVH. Mild MR. Mild MS. Moderate aortic stenosis. The pericardial effusion is slightly larger today. MV inflow variation. She tells me today that she has occasional dyspnea but overall feels great. She tells me that she would never let anyone attempt to drain the fluid from around her heart and would not consider a pericardial window which has been offered in the past at [facility] and here at [facility]    Past Medical History:  Diagnosis Date   Anemia    Angiodysplasia of stomach and duodenum    Aortic stenosis    Arthus phenomenon    AVM (arteriovenous malformation)    Blood transfusion without reported diagnosis    Breast cancer (HCC) 1989   Left   Candida esophagitis (HCC)    Cataract    Chronic respiratory failure (HCC)    CKD stage 3b, GFR 30-44 ml/min (HCC)    Corneal edema    Corneal epithelial basement membrane dystrophy    CREST syndrome (HCC)    GERD (gastroesophageal reflux disease)    w/ HH   Interstitial lung disease (HCC)    Nodule of kidney    Pericardial effusion    PONV (postoperative nausea and vomiting)    Pulmonary embolus (HCC) 2003   Pulmonary hypertension (HCC)    Rectal incontinence    Renal cell carcinoma (HCC)    Scleroderma (HCC)    Tubular adenoma of colon     Uterine polyp     Past Surgical History:  Procedure Laterality Date   APPENDECTOMY  1962   BREAST LUMPECTOMY  1989   left   CATARACT EXTRACTION, BILATERAL Bilateral 12/2013   COLONOSCOPY WITH PROPOFOL N/A 04/15/2021   Procedure: COLONOSCOPY WITH PROPOFOL;  Surgeon: Paula Fee, MD;  Location: [facility] ENDOSCOPY;  Service: Endoscopy;  Laterality: N/A;   ENTEROSCOPY N/A 01/18/2016   Procedure: ENTEROSCOPY;  Surgeon: Paula Form, MD;  Location: [facility];  Service: Endoscopy;  Laterality: N/A;   ENTEROSCOPY N/A 05/24/2018   Procedure: ENTEROSCOPY;  Surgeon: Paula Bologna, MD;  Location: [facility];  Service: Endoscopy;  Laterality: N/A;   ENTEROSCOPY N/A 03/29/2021   Procedure: ENTEROSCOPY;  Surgeon: Paula Fiedler, MD;  Location: [facility];  Service: Gastroenterology;  Laterality: N/A;   ENTEROSCOPY N/A 04/13/2021   Procedure: ENTEROSCOPY;  Surgeon: Paula Deeds, MD;  Location: [facility];  Service: Gastroenterology;  Laterality: N/A;   ESOPHAGOGASTRODUODENOSCOPY (EGD) WITH PROPOFOL N/A 12/21/2015   Procedure: ESOPHAGOGASTRODUODENOSCOPY (EGD) WITH PROPOFOL;  Surgeon: Paula Fredrickson, MD;  Location: [facility];  Service: Endoscopy;  Laterality: N/A;   HOT HEMOSTASIS N/A 05/24/2018   Procedure: HOT HEMOSTASIS (ARGON PLASMA COAGULATION/BICAP);  Surgeon: Paula Bologna, MD;  Location: [facility];  Service: Endoscopy;  Laterality: N/A;   HOT HEMOSTASIS N/A 03/29/2021   Procedure: HOT HEMOSTASIS (ARGON PLASMA COAGULATION/BICAP);  Surgeon: Paula Fiedler, MD;  Location: [facility]OSCOPY;  Service: Gastroenterology;  Laterality: N/A;   HOT HEMOSTASIS N/A 04/13/2021   Procedure: HOT HEMOSTASIS (ARGON PLASMA COAGULATION/BICAP);  Surgeon: Paula Deeds, MD;  Location: [facility];  Service: Gastroenterology;  Laterality: N/A;   INTRAMEDULLARY (IM) NAIL INTERTROCHANTERIC Left 10/11/2022   Procedure: INTRAMEDULLARY (IM) NAIL INTERTROCHANTERIC;  Surgeon: Paula Gross, MD;  [facility];  Service: Orthopedics;  Laterality: Left;   IR GENERIC HISTORICAL  06/05/2016   IR RADIOLOGIST EVAL & MGMT 06/05/2016 Paula Lack, MD [facility] INTERV RAD   IVC Filter     KIDNEY SURGERY     left -"laser surgery by Dr. Fredia Ward- 5 yrs ago" no removal   RIGHT/LEFT HEART CATH AND CORONARY ANGIOGRAPHY N/A 04/25/2022   Procedure: RIGHT/LEFT HEART CATH AND CORONARY ANGIOGRAPHY;  Surgeon: Paula Bollman, MD;  Location: [facility] INVASIVE CV LAB;  Service: Cardiovascular;  Laterality: N/A;   SCHLEROTHERAPY  05/24/2018   Procedure: Theresia Ward;  Surgeon: Paula Bologna, MD;  [facility];  Service: Endoscopy;;   SUBMUCOSAL TATTOO INJECTION  04/13/2021   Procedure: SUBMUCOSAL TATTOO INJECTION;  Surgeon: Paula Deeds, MD;  Location: [facility];  Service: Gastroenterology;;   TONSILLECTOMY     TUBAL LIGATION       Home Medications:  Prior to Admission medications   Medication Sig Start Date End Date Taking? Authorizing Provider  acetaminophen (TYLENOL) 325 MG tablet Take 2 tablets (650 mg total) by mouth every 6 (six) hours as needed for moderate pain. 07/18/22   Paula Bamberg, PA-C  albuterol (PROVENTIL) (2.5 MG/3ML) 0.083% nebulizer solution Take 3 mLs (2.5 mg total) by nebulization every 6 (six) hours as needed for wheezing or shortness of breath. 09/03/23   Ward, Paula Chesterfield, MD  albuterol (VENTOLIN HFA) 108 (90 Base) MCG/ACT inhaler INHALE 2 PUFFS INTO THE LUNGS EVERY 6 HOURS AS NEEDED FOR WHEEZING OR SHORTNESS OF BREATH Patient taking differently: Inhale 2 puffs into the lungs every 6 (six) hours as needed for wheezing or shortness of breath. 03/13/23   Paula Broker, MD  arformoterol (BROVANA) 15 MCG/2ML NEBU Take 2 mLs (15 mcg total) by nebulization 2 (two) times daily. 11/11/23   Ward, Paula Sago, MD  augmented betamethasone dipropionate (DIPROLENE-AF) 0.05 % cream Apply 1 application  topically 2 (two) times daily as needed (for irritation). 07/13/23   [provider]  clobetasol ointment (TEMOVATE) 0.05 % Apply 1 Application topically 2 (two) times daily as needed (for irritation). 04/20/23   [provider]  diltiazem (CARDIZEM) 30 MG tablet Take 1 tablet (30 mg total) by mouth every 12 (twelve) hours. 12/04/23   Ronney Asters, NP  docusate sodium (COLACE) 100 MG capsule Take 1 capsule (100 mg total) by mouth 2 (two) times daily. Patient taking differently: Take 100 mg by mouth in the morning. 10/15/22   Edmisten, Kristie L, PA  empagliflozin (JARDIANCE) 10 MG TABS tablet Take 1 tablet (10 mg total) by mouth daily. 08/16/23   Lewie Chamber, MD  famotidine (PEPCID) 20 MG tablet Take 1 tablet (20 mg total) by mouth at bedtime. 02/10/23   Paula Form, MD  fluticasone (FLONASE) 50 MCG/ACT nasal spray USE 1 SPRAY(S) IN EACH NOSTRIL ONCE DAILY AS NEEDED FOR ALLERGIES 12/04/23   Paula Broker, MD  furosemide (LASIX) 40 MG tablet Take 1 tablet (40 mg total) by mouth daily. 11/04/23   Ronney Asters, NP  guaiFENesin (MUCINEX) 600 MG 12 hr tablet Take 600 mg by mouth  2 (two) times daily as needed.    [provider]  ipratropium (ATROVENT) 0.03 % nasal spray Place 2 sprays into both nostrils every 12 (twelve) hours. Patient taking differently: Place 2 sprays into both nostrils at bedtime. 05/12/22   Martina Sinner, MD  latanoprost (XALATAN) 0.005 % ophthalmic solution Place 1 drop into both eyes at bedtime. 09/23/22   [provider]  magic mouthwash SOLN Take 10 mLs by mouth 4 (four) times daily as needed for mouth pain. 03/22/22   Burnadette Pop, MD  mupirocin ointment (BACTROBAN) 2 % Apply 1 Application topically 2 (two) times daily as needed (for irritation).    [provider]  Nutritional Supplements (ENSURE HIGH PROTEIN) LIQD Take 0.5 Bottles by mouth daily as needed (for supplementation).    [provider]  OXYGEN Inhale 2-3 L/min into the lungs continuous.    [provider]  pantoprazole  (PROTONIX) 40 MG tablet TAKE 1 TABLET BY MOUTH TWICE DAILY BEFORE A MEAL 10/19/23   Nandigam, Eleonore Chiquito, MD  polyethylene glycol (MIRALAX / GLYCOLAX) 17 g packet Take 17 g by mouth daily. Patient not taking: Reported on 12/02/2023 10/15/22   Edmisten, Lyn Hollingshead, PA  revefenacin (YUPELRI) 175 MCG/3ML nebulizer solution Take 3 mLs (175 mcg total) by nebulization daily. 11/11/23   Ward, Paula Sago, MD  spironolactone (ALDACTONE) 25 MG tablet Take 1 tablet (25 mg total) by mouth daily. 10/30/23   Ronney Asters, NP  sucralfate (CARAFATE) 1 GM/10ML suspension TAKE  10 ML BY MOUTH EVERY 6 HOURS AS NEEDED FOR REFLUX 09/25/23   Paula Form, MD  triamcinolone cream (KENALOG) 0.1 % Apply 1 Application topically See admin instructions. Apply to affected leg(s) after showers and up to 2 additional times a day as needed for irritation 07/07/23   [provider]    Inpatient Medications: Scheduled Meds:  Continuous Infusions:  PRN Meds:   Allergies:    Allergies  Allergen Reactions   Codeine Nausea Only    Hallucinations, too   Other Nausea And Vomiting and Other (See Comments)    "-mycin" antibiotics.   Also cause hallucinations.   Erythromycin Nausea And Vomiting   Iodinated Contrast Media Other (See Comments)    Renal issues   Lisinopril     Pt doesn't remember reaction   Mirtazapine Other (See Comments)    "Threw me into orbit"   Mycophenolate Mofetil Nausea Only   Sulfa Antibiotics Other (See Comments)    Unknown reaction    Social History:   Social History   Socioeconomic History   Marital status: Married    Spouse name: Not on file   Number of children: 2   Years of education: Not on file   Highest education level: Not on file  Occupational History   Occupation: Retired  Tobacco Use   Smoking status: Never   Smokeless tobacco: Never  Vaping Use   Vaping status: Never Used  Substance and Sexual Activity   Alcohol use: No   Drug use: No   Sexual  activity: Not Currently  Other Topics Concern   Not on file  Social History Narrative   Married '611 son- '65, 1 daughter '63; 6 children (2 adopted)SO- SOBRetirement- doing well. Marriage in good health. Patient has never smoked. Alcohol use- noPt gets regular exercise   Social Drivers of Health   Financial Resource Strain: Low Risk  (04/02/2022)   Overall Financial Resource Strain (CARDIA)    Difficulty of Paying Living Expenses:  Not hard at all  Food Insecurity: No Food Insecurity (09/04/2023)   Hunger Vital Sign    Worried About Running Out of Food in the Last Year: Never true    Ran Out of Food in the Last Year: Never true  Transportation Needs: No Transportation Needs (09/04/2023)   PRAPARE - Administrator, Civil Service (Medical): No    Ward of Transportation (Non-Medical): No  Physical Activity: Inactive (04/02/2022)   Exercise Vital Sign    Days of Exercise per Week: 0 days    Minutes of Exercise per Session: 0 min  Stress: No Stress Concern Present (04/02/2022)   [employer] of Occupational Health - Occupational Stress Questionnaire    Feeling of Stress : Not at all  Social Connections: Socially Integrated (04/02/2022)   Social Connection and Isolation Panel [NHANES]    Frequency of Communication with Friends and Family: More than three times a week    Frequency of Social Gatherings with Friends and Family: More than three times a week    Attends Religious Services: More than 4 times per year    Active Member of [employer] or Organizations: Yes    Attends Engineer, structural: More than 4 times per year    Marital Status: Married  Catering manager Violence: Not At Risk (09/04/2023)   Humiliation, Afraid, Rape, and Kick questionnaire    Fear of Current or Ex-Partner: No    Emotionally Abused: No    Physically Abused: No    Sexually Abused: No    Family History:   Family History  Problem Relation Age of Onset   Bladder Cancer Father    Diabetes  Father    Prostate cancer Father    Alzheimer's disease Mother    Diabetes Sister    Lung cancer Sister         smoker   Esophageal cancer Paternal Uncle    Colon cancer Neg Hx      ROS:  Please see the history of present illness.  All other ROS reviewed and negative.     Physical Exam/Data:   Vitals:   12/04/23 1440 12/04/23 1512  BP: (!) 126/58   Pulse: 88   Resp: 16   Temp: 98 F (36.7 C)   TempSrc: Oral   SpO2: 100%   Weight:  46 kg  Height:  5\' 3"  (1.6 m)   No intake or output data in the 24 hours ending 12/04/23 1738    12/04/2023    3:12 PM 11/11/2023    1:36 PM 10/30/2023    1:48 PM  Last 3 Weights  Weight (lbs) 101 lb 6.6 oz 101 lb 101 lb 12.8 oz  Weight (kg) 46 kg 45.813 kg 46.176 kg     Body mass index is 17.96 kg/m.  General:  Well nourished, well developed, in no acute distres HEENT: normal Neck: no JVD Vascular: No carotid bruits; Distal pulses 2+ bilaterally Cardiac:  normal S1, S2; RRR; harsh systolic murmur Lungs:  clear to auscultation bilaterally, no wheezing, rhonchi or rales  Abd: soft, nontender, no hepatomegaly  Ext: no edema Musculoskeletal:  No deformities, BUE and BLE strength normal and equal Skin: warm and dry  Neuro:  CNs 2-12 intact, no focal abnormalities noted Psych:  Normal affect   EKG:  The EKG was personally reviewed and demonstrates:  Sinus, non-specific T wave abn Telemetry:  Telemetry was personally reviewed and demonstrates:  sinus  Relevant CV Studies:   Laboratory Data:  High  Sensitivity Troponin:   Recent Labs  Lab 12/04/23 1511  TROPONINIHS 47*     Chemistry Recent Labs  Lab 12/04/23 1511  NA 138  K 4.5  CL 91*  CO2 33*  GLUCOSE 89  BUN 62*  CREATININE 1.55*  CALCIUM 9.3  GFRNONAA 33*  ANIONGAP 14    Recent Labs  Lab 12/04/23 1511  PROT 6.9  ALBUMIN 3.9  AST 27  ALT 16  ALKPHOS 60  BILITOT 0.8   Lipids No results for input(s): "CHOL", "TRIG", "HDL", "LABVLDL", "LDLCALC", "CHOLHDL" in  the last 168 hours.  Hematology Recent Labs  Lab 12/04/23 1511  WBC 6.0  RBC 2.76*  HGB 9.8*  HCT 31.4*  MCV 113.8*  MCH 35.5*  MCHC 31.2  RDW 12.4  PLT 192   Thyroid No results for input(s): "TSH", "FREET4" in the last 168 hours.  BNP Recent Labs  Lab 12/04/23 1511  BNP 1,686.5*    DDimer No results for input(s): "DDIMER" in the last 168 hours.   Radiology/Studies:  DG Chest 1 View Result Date: 12/04/2023 CLINICAL DATA:  [id number] SOB (shortness of breath) [id number] EXAM: CHEST  1 VIEW COMPARISON:  Chest x-ray 08/29/2023, CT chest 04/23/2022 FINDINGS: Persistent cardiomegaly with query associated underlying pericardial effusion. The heart and mediastinal contours are unchanged. Atherosclerotic plaque. No focal consolidation. Mild pulmonary edema. Bilateral trace to small volume pleural effusions. No pneumothorax. No acute osseous abnormality.  Left axillary vascular clips. IMPRESSION: 1. Persistent cardiomegaly with query associated underlying pericardial effusion. 2. Bilateral trace to small volume pleural effusions. 3. Mild pulmonary edema 4. Aortic Atherosclerosis (ICD10-I70.0). Electronically Signed   By: Tish Frederickson M.D.   On: 12/04/2023 17:20   ECHOCARDIOGRAM COMPLETE Result Date: 12/03/2023    ECHOCARDIOGRAM REPORT   Patient Name:   MIDORI DADO Date of Exam: 12/03/2023 Medical Rec #:  [id number]       Height:       63.0 in Accession #:    [id number]      Weight:       101.0 lb Date of Birth:  November 13, 1935       BSA:          1.446 m Patient Age:    87 years        BP:           106/57 mmHg Patient Gender: F               HR:           94 bpm. Exam Location:  [address] Procedure: 2D Echo, Cardiac Doppler and Color Doppler (Both Spectral and Color            Flow Doppler were utilized during procedure). Indications:    I31.3 Pericardial Effusion  History:        Patient has prior history of Echocardiogram examinations.                 Pulmonary HTN, Aortic Valve Disease; Risk  Factors:Hypertension.  Sonographer:    Clearence Ped RCS Referring Phys: [id number] BRIAN S CRENSHAW IMPRESSIONS  1. Left ventricular ejection fraction, by estimation, is 70 to 75%. The left ventricle has hyperdynamic function. The left ventricle has no regional wall motion abnormalities. There is severe concentric left ventricular hypertrophy. Left ventricular diastolic parameters are consistent with Grade I diastolic dysfunction (impaired relaxation). Elevated left ventricular end-diastolic pressure.  2. Right ventricular systolic function is normal. The right ventricular size is normal.  3. There effusion  measures 5.06cm at largest diameter posteriorly and 2.29cm anteriorly.. Large pericardial effusion. The pericardial effusion is circumferential.  4. The mitral valve is normal in structure. Mild mitral valve regurgitation. Mild to moderate mitral stenosis. The mean mitral valve gradient is 6.0 mmHg.  5. Tricuspid valve regurgitation is moderate.  6. The aortic valve is tricuspid. There is moderate calcification of the aortic valve. There is moderate thickening of the aortic valve. Aortic valve regurgitation is not visualized. Moderate aortic valve stenosis. Aortic valve area, by VTI measures 1.17 cm. Aortic valve mean gradient measures 21.0 mmHg. Aortic valve Vmax measures 3.14 m/s.  7. Compared to study dated 01/15/23 the pericardial effusion has increased in diameter posteriorly (4cm prior andnow 5cm) and there is now a 40mm respirophasic change in MV inflow velocity (2mm on prior echo). There is no overt diastolic RV collapse, but  both the RV and LV are very underfilled. There is also now moderate AS.  8. Case discussed with Dr. Jens Som who recommended patient come in to office 2/14 for DOD visit if asymptomatic today. FINDINGS  Left Ventricle: Left ventricular ejection fraction, by estimation, is 70 to 75%. The left ventricle has hyperdynamic function. The left ventricle has no regional wall motion  abnormalities. Strain imaging was not performed. The left ventricular internal cavity size was normal in size. There is severe concentric left ventricular hypertrophy. Left ventricular diastolic parameters are consistent with Grade I diastolic dysfunction (impaired relaxation). Elevated left ventricular end-diastolic pressure. Right Ventricle: The right ventricular size is normal. No increase in right ventricular wall thickness. Right ventricular systolic function is normal. Left Atrium: Left atrial size was normal in size. Right Atrium: Right atrial size was normal in size. Pericardium: There effusion measures 5.06cm at largest diameter posteriorly and 2.29cm anteriorly. A large pericardial effusion is present. The pericardial effusion is circumferential. There is diastolic collapse of the right atrial wall, diastolic collapse of the right ventricular free wall and excessive respiratory variation in the mitral valve spectral Doppler velocities. Mitral Valve: The mitral valve is normal in structure. Mild mitral valve regurgitation. Mild to moderate mitral valve stenosis. MV peak gradient, 12.1 mmHg. The mean mitral valve gradient is 6.0 mmHg. Tricuspid Valve: The tricuspid valve is normal in structure. Tricuspid valve regurgitation is moderate . No evidence of tricuspid stenosis. Aortic Valve: The aortic valve is tricuspid. There is moderate calcification of the aortic valve. There is moderate thickening of the aortic valve. Aortic valve regurgitation is not visualized. Moderate aortic stenosis is present. Aortic valve mean gradient measures 21.0 mmHg. Aortic valve peak gradient measures 39.4 mmHg. Aortic valve area, by VTI measures 1.17 cm. Pulmonic Valve: The pulmonic valve was normal in structure. Pulmonic valve regurgitation is mild. No evidence of pulmonic stenosis. Aorta: The aortic root is normal in size and structure. Venous: The inferior vena cava was not well visualized. IAS/Shunts: No atrial level shunt  detected by color flow Doppler. Additional Comments: 3D imaging was not performed.  LEFT VENTRICLE PLAX 2D LVIDd:         2.70 cm   Diastology LVIDs:         2.00 cm   LV e' medial:    3.15 cm/s LV PW:         1.80 cm   LV E/e' medial:  37.5 LV IVS:        1.30 cm   LV e' lateral:   7.51 cm/s LVOT diam:     1.90 cm   LV E/e' lateral: 15.7  LV SV:         64 LV SV Index:   44 LVOT Area:     2.84 cm  RIGHT VENTRICLE RV Basal diam:  3.00 cm RV S prime:     17.20 cm/s TAPSE (M-mode): 1.8 cm RVSP:           66.4 mmHg LEFT ATRIUM              Index        RIGHT ATRIUM           Index LA diam:        3.90 cm  2.70 cm/m   RA Pressure: 3.00 mmHg LA Vol (A2C):   99.0 ml  68.45 ml/m  RA Area:     14.70 cm LA Vol (A4C):   95.2 ml  65.82 ml/m  RA Volume:   38.50 ml  26.62 ml/m LA Biplane Vol: 101.0 ml 69.83 ml/m  AORTIC VALVE AV Area (Vmax):    1.08 cm AV Area (Vmean):   1.05 cm AV Area (VTI):     1.17 cm AV Vmax:           314.00 cm/s AV Vmean:          210.000 cm/s AV VTI:            0.547 m AV Peak Grad:      39.4 mmHg AV Mean Grad:      21.0 mmHg LVOT Vmax:         120.00 cm/s LVOT Vmean:        78.100 cm/s LVOT VTI:          0.226 m LVOT/AV VTI ratio: 0.41  AORTA Ao Root diam: 3.10 cm Ao Asc diam:  3.10 cm MITRAL VALVE                TRICUSPID VALVE MV Area (PHT): 5.34 cm     TR Peak grad:   63.4 mmHg MV Area VTI:   1.57 cm     TR Vmax:        398.00 cm/s MV Peak grad:  12.1 mmHg    Estimated RAP:  3.00 mmHg MV Mean grad:  6.0 mmHg     RVSP:           66.4 mmHg MV Vmax:       1.74 m/s MV Vmean:      119.0 cm/s   SHUNTS MV Decel Time: 142 msec     Systemic VTI:  0.23 m MV E velocity: 118.00 cm/s  Systemic Diam: 1.90 cm MV A velocity: 167.00 cm/s MV E/A ratio:  0.71 Armanda Magic MD Electronically signed by Armanda Magic MD Signature Date/Time: 12/03/2023/8:22:40 PM    Final      Assessment and Plan:   Large pericardial effusion without clear evidence of cardiac tamponade: Her pericardial effusion has been  present for many years. It is now slightly larger but she clinically does not appear to be in tamponade. She has no symptoms. Normal heart rate and blood pressure. Echo criteria not suggestive of tamponade except for variation in her mitral inflow. In this elderly female with known chronic large pericardial effusion for years, I would not consider emergent pericardiocentesis. She has told me that she would not let anyone attempt pericardiocentesis or a pericardial window. She wishes to go home tonight. I think this is reasonable. She is given instructions to return to the ED with onset of dyspnea, dizziness.   Moderate  aortic stenosis: Unchanged over the past few years. Will follow  For questions or updates, please contact [facility] Please consult [email or web address] for contact info under    Signed, Verne Carrow, MD  12/04/2023 5:38 PM

## 2023-12-04 NOTE — Discharge Instructions (Signed)
Your pericardial effusion is likely chronic in nature.  No specific treatment indicated at this time.  You may follow-up closely with your doctor for further care.  Return if you experience chest pain, trouble breathing, or if you have other concern.

## 2023-12-04 NOTE — Telephone Encounter (Signed)
Spoke with pt and she stated she has to have someone stay with her husband and they can't do that until 1 PM today. Explained to pt that if she absolutely couldn't go until then, to monitor symptoms and make sure if she has CP or dizziness, etc. To call EMS. Pt stated she would try to see if she could go sooner.

## 2023-12-04 NOTE — ED Provider Triage Note (Signed)
Emergency Medicine Provider Triage Evaluation Note  LISSETH BRAZEAU , a 88 y.o. female  was evaluated in triage.  Pt was told to come in by her cardiologist.  She had an echo done yesterday which showed a large pericardial effusion.  Patient endorses bilateral rib pain for the past several days.  Chronic shortness of breath for which she uses supplemental oxygen at home.  Review of Systems  Positive: Chest pain, shortness of breath Negative:   Physical Exam  BP (!) 126/58   Pulse 88   Temp 98 F (36.7 C) (Oral)   Resp 16   SpO2 100%  Gen:   Awake, no distress  Resp:  Normal effort  MSK:   Moves extremities without difficulty  Other:    Medical Decision Making  Medically screening exam initiated at 3:02 PM.  Appropriate orders placed.  [name] was informed that the remainder of the evaluation will be completed by another provider, this initial triage assessment does not replace that evaluation, and the importance of remaining in the ED until their evaluation is complete.   Maxwell Marion, PA-C 12/04/23 [id number]

## 2023-12-04 NOTE — Progress Notes (Deleted)
 Cardiology Clinic Note   Patient Name: Paula Ward Date of Encounter: 12/04/2023  Primary Care Provider:  [name], MD Primary Cardiologist:  Olga Millers, MD  Patient Profile    Paula Ward 88 year old female patient of Dr Jens Som added on to DOD schedule for pericardial effusion.   Past Medical History    Past Medical History:  Diagnosis Date   Anemia    Angiodysplasia of stomach and duodenum    Aortic stenosis    Arthus phenomenon    AVM (arteriovenous malformation)    Blood transfusion without reported diagnosis    Breast cancer (HCC) 1989   Left   Candida esophagitis (HCC)    Cataract    Chronic respiratory failure (HCC)    CKD stage 3b, GFR 30-44 ml/min (HCC)    Corneal edema    Corneal epithelial basement membrane dystrophy    CREST syndrome (HCC)    GERD (gastroesophageal reflux disease)    w/ HH   Interstitial lung disease (HCC)    Nodule of kidney    Pericardial effusion    PONV (postoperative nausea and vomiting)    Pulmonary embolus (HCC) 2003   Pulmonary hypertension (HCC)    Rectal incontinence    Renal cell carcinoma (HCC)    Scleroderma (HCC)    Tubular adenoma of colon    Uterine polyp    Past Surgical History:  Procedure Laterality Date   APPENDECTOMY  1962   BREAST LUMPECTOMY  1989   left   CATARACT EXTRACTION, BILATERAL Bilateral 12/2013   COLONOSCOPY WITH PROPOFOL N/A 04/15/2021   Procedure: COLONOSCOPY WITH PROPOFOL;  Surgeon: Rachael Fee, MD;  Location: [facility];  Service: Endoscopy;  Laterality: N/A;   ENTEROSCOPY N/A 01/18/2016   Procedure: ENTEROSCOPY;  Surgeon: Napoleon Form, MD;  Location: [facility];  Service: Endoscopy;  Laterality: N/A;   ENTEROSCOPY N/A 05/24/2018   Procedure: ENTEROSCOPY;  Surgeon: Lynann Bologna, MD;  Location: [facility];  Service: Endoscopy;  Laterality: N/A;   ENTEROSCOPY N/A 03/29/2021   Procedure: ENTEROSCOPY;  Surgeon: Beverley Fiedler, MD;  Location: [facility];   Service: Gastroenterology;  Laterality: N/A;   ENTEROSCOPY N/A 04/13/2021   Procedure: ENTEROSCOPY;  Surgeon: Benancio Deeds, MD;  Location: [facility];  Service: Gastroenterology;  Laterality: N/A;   ESOPHAGOGASTRODUODENOSCOPY (EGD) WITH PROPOFOL N/A 12/21/2015   Procedure: ESOPHAGOGASTRODUODENOSCOPY (EGD) WITH PROPOFOL;  Surgeon: Hilarie Fredrickson, MD;  Location: [facility];  Service: Endoscopy;  Laterality: N/A;   HOT HEMOSTASIS N/A 05/24/2018   Procedure: HOT HEMOSTASIS (ARGON PLASMA COAGULATION/BICAP);  Surgeon: Lynann Bologna, MD;  Location: [facility];  Service: Endoscopy;  Laterality: N/A;   HOT HEMOSTASIS N/A 03/29/2021   Procedure: HOT HEMOSTASIS (ARGON PLASMA COAGULATION/BICAP);  Surgeon: Beverley Fiedler, MD;  Location: [facility];  Service: Gastroenterology;  Laterality: N/A;   HOT HEMOSTASIS N/A 04/13/2021   Procedure: HOT HEMOSTASIS (ARGON PLASMA COAGULATION/BICAP);  Surgeon: Benancio Deeds, MD;  Location: [facility];  Service: Gastroenterology;  Laterality: N/A;   INTRAMEDULLARY (IM) NAIL INTERTROCHANTERIC Left 10/11/2022   Procedure: INTRAMEDULLARY (IM) NAIL INTERTROCHANTERIC;  Surgeon: Ollen Gross, MD;  Location: [facility];  Service: Orthopedics;  Laterality: Left;   IR GENERIC HISTORICAL  06/05/2016   IR RADIOLOGIST EVAL & MGMT 06/05/2016 [name], MD [facility] INTERV RAD   IVC Filter     KIDNEY SURGERY     left -"laser surgery by Dr. Fredia Sorrow- 5 yrs ago" no removal   RIGHT/LEFT HEART CATH AND CORONARY ANGIOGRAPHY  N/A 04/25/2022   Procedure: RIGHT/LEFT HEART CATH AND CORONARY ANGIOGRAPHY;  Surgeon: Tonny Bollman, MD;  Location: [facility] INVASIVE CV LAB;  Service: Cardiovascular;  Laterality: N/A;   SCHLEROTHERAPY  05/24/2018   Procedure: Theresia Majors;  Surgeon: Lynann Bologna, MD;  Location: [facility];  Service: Endoscopy;;   SUBMUCOSAL TATTOO INJECTION  04/13/2021   Procedure: SUBMUCOSAL TATTOO INJECTION;  Surgeon: Benancio Deeds, MD;  Location: [facility];   Service: Gastroenterology;;   TONSILLECTOMY     TUBAL LIGATION      Allergies  Allergies  Allergen Reactions   Codeine Nausea Only    Hallucinations, too   Other Nausea And Vomiting and Other (See Comments)    "-mycin" antibiotics.   Also cause hallucinations.   Erythromycin Nausea And Vomiting   Iodinated Contrast Media Other (See Comments)    Renal issues   Lisinopril     Pt doesn't remember reaction   Mirtazapine Other (See Comments)    "Threw me into orbit"   Mycophenolate Mofetil Nausea Only   Sulfa Antibiotics Other (See Comments)    Unknown reaction    History of Present Illness    Paula Ward 88 year old female has a PMH of essential hypertension, pulmonary hypertension, pericardial effusion, chronic diastolic CHF, AVM, moderate aortic stenosis, orthostatic hypotension, interstitial lung disease, GERD, CKD stage III, PE, incontinence, scleroderma, anemia, generalized weakness, vitamin B12 deficiency, and macrocytic anemia.  Her PMH also includes gastrointestinal bleeding secondary to AVM malformations which required IVC filter placement and breast CA.  She underwent cardiac catheterization 7/23 which showed no obstructive coronary disease, pulmonary hypertension, moderate aortic stenosis with a mean gradient of 16 mmHg.  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  ***  .   Home Medications    Prior to Admission medications   Medication Sig  Start Date End Date Taking? Authorizing Provider  acetaminophen (TYLENOL) 325 MG tablet Take 2 tablets (650 mg total) by mouth every 6 (six) hours as needed for moderate pain. 07/18/22   Wallis Bamberg, PA-C  albuterol (VENTOLIN HFA) 108 (90 Base) MCG/ACT inhaler INHALE 2 PUFFS INTO THE LUNGS EVERY 6 HOURS AS NEEDED FOR WHEEZING OR SHORTNESS OF BREATH 03/13/23   [name], MD  augmented betamethasone dipropionate (DIPROLENE-AF) 0.05 % cream Apply topically 2 (two) times daily as needed. 07/13/23   [provider]  clobetasol ointment (TEMOVATE) 0.05 % Apply 1 Application topically 2 (two) times daily. 04/20/23   [provider]  Cyanocobalamin (VITAMIN B-12) 1000 MCG SUBL Place 1,000 mcg under the tongue. Taking twice a week    [provider]  docusate sodium (COLACE) 100 MG capsule Take 1 capsule (100 mg total) by mouth 2 (two) times daily. Patient taking differently: Take 100 mg by mouth 2 (two) times daily. Pt takes every morning 10/15/22   Edmisten, Kristie L, PA  famotidine (PEPCID) 20 MG tablet Take 1  tablet (20 mg total) by mouth at bedtime. 02/10/23   Napoleon Form, MD  fluticasone (FLONASE) 50 MCG/ACT nasal spray Place 1 spray into both nostrils daily as needed for allergies. Patient taking differently: Place 1 spray into both nostrils in the morning and at bedtime. 05/15/22   [name], MD  furosemide (LASIX) 20 MG tablet Take 2 tablets (40 mg total) by mouth daily. 06/19/23   Reather Littler D, NP  guaiFENesin (MUCINEX) 600 MG 12 hr tablet Take 300 mg by mouth daily.    [provider]  ipratropium (ATROVENT) 0.03 % nasal spray Place 2 sprays into both nostrils every 12 (twelve) hours. 05/12/22   Martina Sinner, MD  latanoprost (XALATAN) 0.005 % ophthalmic solution Place 1 drop into both eyes at bedtime. 09/23/22   [provider]  magic mouthwash SOLN Take 10 mLs by mouth 4 (four) times daily as needed for mouth pain. 03/22/22    Burnadette Pop, MD  mirtazapine (REMERON) 15 MG tablet Take 1 tablet (15 mg total) by mouth at bedtime. 03/13/23   [name], MD  montelukast (SINGULAIR) 10 MG tablet TAKE 1 TABLET BY MOUTH AT BEDTIME 07/31/23   [name], MD  Multiple Vitamin (MULTIVITAMIN) capsule Take 1 capsule by mouth daily.    [provider]  mupirocin ointment (BACTROBAN) 2 % Apply 1 Application topically 2 (two) times daily as needed (dry skin).    [provider]  OPSUMIT 10 MG tablet Take 1 tablet (10 mg total) by mouth daily. 07/07/23   Parrett, Virgel Bouquet, NP  OXYGEN Inhale into the lungs. 2 LMP at night and upon exertion    [provider]  pantoprazole (PROTONIX) 40 MG tablet Take 1 tablet (40 mg total) by mouth daily. 02/10/23   Napoleon Form, MD  polyethylene glycol (MIRALAX / GLYCOLAX) 17 g packet Take 17 g by mouth daily. 10/15/22   Edmisten, Lyn Hollingshead, PA  spironolactone (ALDACTONE) 25 MG tablet Take 1 tablet (25 mg total) by mouth daily. 08/03/23   Gwenlyn Fudge, FNP  sucralfate (CARAFATE) 1 GM/10ML suspension TAKE 10 MLS BY MOUTH EVERY 6 HOURS AS NEEDED FOR REFLUX 02/11/23   Napoleon Form, MD  triamcinolone cream (KENALOG) 0.1 % Apply 1 Application topically 2 (two) times daily. 07/07/23   [provider]    Family History    Family History  Problem Relation Age of Onset   Bladder Cancer Father    Diabetes Father    Prostate cancer Father    Alzheimer's disease Mother    Diabetes Sister    Lung cancer Sister         smoker   Esophageal cancer Paternal Uncle    Colon cancer Neg Hx    She indicated that her mother is deceased. She indicated that her father is deceased. She indicated that the status of her sister is unknown. She indicated that the status of her paternal uncle is unknown. She indicated that the status of her neg hx is unknown.  Social History    Social History   Socioeconomic History   Marital status: Married     Spouse name: Not on file   Number of children: 2   Years of education: Not on file   Highest education level: Not on file  Occupational History   Occupation: Retired  Tobacco Use   Smoking status: Never   Smokeless tobacco: Never  Vaping Use   Vaping status: Never Used  Substance and Sexual Activity   Alcohol use: No   Drug use: No   Sexual activity: Not Currently  Other Topics Concern   Not on file  Social History Narrative   Married '611 son- '65, 1 daughter '63; 6 children (2 adopted)SO- SOBRetirement- doing well. Marriage in good health. Patient has never smoked. Alcohol use- noPt gets regular exercise   Social Drivers of Health   Financial Resource Strain: Low Risk  (04/02/2022)   Overall Financial Resource Strain (CARDIA)    Difficulty of Paying Living Expenses: Not hard at all  Food Insecurity: No Food Insecurity (09/04/2023)   Hunger Vital Sign    Worried About Running Out of Food in the Last Year: Never true    Ran Out of Food in the Last Year: Never true  Transportation Needs: No Transportation Needs (09/04/2023)   PRAPARE - Administrator, Civil Service (Medical): No    Lack of Transportation (Non-Medical): No  Physical Activity: Inactive (04/02/2022)   Exercise Vital Sign    Days of Exercise per Week: 0 days    Minutes of Exercise per Session: 0 min  Stress: No Stress Concern Present (04/02/2022)   [employer] of Occupational Health - Occupational Stress Questionnaire    Feeling of Stress : Not at all  Social Connections: Socially Integrated (04/02/2022)   Social Connection and Isolation Panel [NHANES]    Frequency of Communication with Friends and Family: More than three times a week    Frequency of Social Gatherings with Friends and Family: More than three times a week    Attends Religious Services: More than 4 times per year    Active Member of [employer] or Organizations: Yes    Attends Engineer, structural: More than 4 times per year     Marital Status: Married  Catering manager Violence: Not At Risk (09/04/2023)   Humiliation, Afraid, Rape, and Kick questionnaire    Fear of Current or Ex-Partner: No    Emotionally Abused: No    Physically Abused: No    Sexually Abused: No     Review of Systems    General:  No chills, fever, night sweats or weight changes.  Cardiovascular:  No chest pain, dyspnea on exertion, edema, orthopnea, palpitations, paroxysmal nocturnal dyspnea. Dermatological: No rash, lesions/masses Respiratory: No cough, dyspnea Urologic: No hematuria, dysuria Abdominal:   No nausea, vomiting, diarrhea, bright red blood per rectum, melena, or hematemesis Neurologic:  No visual changes, wkns, changes in mental status. All other systems reviewed and are otherwise negative except as noted above.  Physical Exam    VS:  There were no vitals taken for this visit. , BMI There is no height or weight on file to calculate BMI. GEN: Well nourished, well developed, in no acute distress. HEENT: normal. Neck: Supple, no JVD, carotid bruits, or masses. Cardiac: RRR, 3/6 systolicmurmur heard along right sternal border., rubs, or gallops. No clubbing, cyanosis, euvolemic.  Radials/DP/PT 2+ and equal bilaterally.  Respiratory:  Respirations regular and unlabored,  GI: Soft, nontender, nondistended, BS + x 4. MS: no deformity or atrophy. Skin: warm and dry, no rash. Neuro:  Strength and sensation are intact. Psych: Normal affect.  Accessory Clinical Findings    Recent Labs: 06/03/2023: Pro B Natriuretic peptide (BNP) 2,680.0 08/29/2023: B Natriuretic Peptide 2,168.5 09/03/2023: Magnesium 2.4 11/26/2023: ALT 14; BUN 63; Creatinine 1.70; Hemoglobin 9.9; Platelet Count 167; Potassium 3.9; Sodium 140   Recent Lipid Panel    Component Value Date/Time  CHOL 158 06/24/2018 1004   TRIG 112.0 06/24/2018 1004   HDL 57.50 06/24/2018 1004   CHOLHDL 3 06/24/2018 1004   VLDL 22.4 06/24/2018 1004   LDLCALC 78 06/24/2018 1004     No BP recorded.  {Refresh Note OR Click here to enter BP  :1}***    ECG personally reviewed by me today-  none today.     Echocardiogram 12/03/23  IMPRESSIONS     1. Left ventricular ejection fraction, by estimation, is 70 to 75%. The  left ventricle has hyperdynamic function. The left ventricle has no  regional wall motion abnormalities. There is severe concentric left  ventricular hypertrophy. Left ventricular  diastolic parameters are consistent with Grade I diastolic dysfunction  (impaired relaxation). Elevated left ventricular end-diastolic pressure.   2. Right ventricular systolic function is normal. The right ventricular  size is normal.   3. There effusion measures 5.06cm at largest diameter posteriorly and  2.29cm anteriorly.. Large pericardial effusion. The pericardial effusion  is circumferential.   4. The mitral valve is normal in structure. Mild mitral valve  regurgitation. Mild to moderate mitral stenosis. The mean mitral valve  gradient is 6.0 mmHg.   5. Tricuspid valve regurgitation is moderate.   6. The aortic valve is tricuspid. There is moderate calcification of the  aortic valve. There is moderate thickening of the aortic valve. Aortic  valve regurgitation is not visualized. Moderate aortic valve stenosis.  Aortic valve area, by VTI measures  1.17 cm. Aortic valve mean gradient measures 21.0 mmHg. Aortic valve Vmax  measures 3.14 m/s.   7. Compared to study dated 01/15/23 the pericardial effusion has increased  in diameter posteriorly (4cm prior andnow 5cm) and there is now a 40mm  respirophasic change in MV inflow velocity (2mm on prior echo). There is  no overt diastolic RV collapse, but   both the RV and LV are very underfilled. There is also now moderate AS.   8. Case discussed with Dr. Jens Som who recommended patient come in to  office 2/14 for DOD visit if asymptomatic today   Echocardiogram 08/31/2023 IMPRESSIONS     1. Left ventricular  ejection fraction, by estimation, is 60 to 65%. The  left ventricle has normal function. The left ventricle has no regional  wall motion abnormalities.   2. Right ventricular systolic function is normal. The right ventricular  size is normal.   3. Left atrial size was mildly dilated.   4. Severe mitral annular calcification. Visually there is mitral stenosis  but doppler interrogation was not done.   5. The aortic valve is tricuspid. There is severe calcifcation of the  aortic valve. Visually there is aortic stenosis but doppler interrogation  was not done.   6. The inferior vena cava is normal in size with <50% respiratory  variability, suggesting right atrial pressure of 8 mmHg.   7. There was a large circumferential pericardial effusion. There was no  RV diastolic collapse noted. Mitral E inflow velocity showed < 25%  respirophasic variation. The IVC was not significantly dilated. There does  not appear to be tamponade physiology.   8. Limited echo.   FINDINGS   Left Ventricle: Left ventricular ejection fraction, by estimation, is 60  to 65%. The left ventricle has normal function. The left ventricle has no  regional wall motion abnormalities. The left ventricular internal cavity  size was normal in size.   Right Ventricle: The right ventricular size is normal. No increase in  right ventricular wall thickness.  Right ventricular systolic function is  normal.   Left Atrium: Left atrial size was mildly dilated.   Right Atrium: Right atrial size was normal in size.   Pericardium: There was a large circumferential pericardial effusion. There  was no RV diastolic collapse noted. Mitral E inflow velocity showed < 25%  respirophasic variation. The IVC was not significantly dilated. There does  not appear to be tamponade  physiology.   Mitral Valve: There is moderate calcification of the mitral valve  leaflet(s). Severe mitral annular calcification.   Aortic Valve: The aortic  valve is tricuspid. There is severe calcifcation  of the aortic valve.   Venous: The inferior vena cava is normal in size with less than 50%  respiratory variability, suggesting right atrial pressure of 8 mmHg.   Additional Comments: Spectral Doppler performed. Color Doppler performed.    IVC  IVC diam: 1.50 cm   Dalton McleanMD  Electronically signed by Wilfred Lacy  Signature Date/Time: 08/31/2023/4:29:29 PM      Cardiac catheterization 04/25/2022  1.  Ectatic but patent coronary arteries with no obstructive disease.  Right dominant coronary artery. 2.  Mild pulmonary hypertension with PA pressure 52/15, mean 28 mmHg, transpulmonary gradient 10 mmHg, PVR approximately 2 Wood units. 3.  Moderate aortic stenosis with mean transvalvular gradient 16 mmHg calculated aortic valve area 1.16 cm 4.  Heavy mitral annular calcification by plain fluoroscopy 5.  Mild aortic valve calcification by plain fluoroscopy   Recommend: Continued medical therapy, clinical follow-up, echo surveillance.  The patient does not appear to have severe aortic stenosis and I do not think she would benefit from TAVR at this time.   Diagnostic Dominance: Right  Intervention   Assessment & Plan   1.  Pericardial effusion: Chronic but very large/circumferential with swinging heart RA diastolic collapse and some TV inflow variation Should not be difficult to tap percutaneously and have recommended hospitalization with pericardiocentesis   Essential hypertension-BP today 124/64 Maintain blood pressure log Continue current medical therapy Heart healthy low-sodium diet  Pulmonary hypertension-breathing stable.  Follows with [facility] pulmonary [facility] clinic. Using 3-4L of O2 in clinic today.  Not wearing oxygen in room during exam. Continue current medical therapy  Aortic stenosis-moderate follow with echo per Dr Excell Seltzer no need to entertain TAVR now   CKD stage III-creatinine 1.27 on 10/09/23.    Avoid  nephrotoxic agents.   Follows with PCP/nephrology Ordered BMP  Disposition: Follow-up with Dr. Jens Som post hospital   Charlton Haws MD [facility] [place]

## 2023-12-06 DIAGNOSIS — I509 Heart failure, unspecified: Secondary | ICD-10-CM | POA: Diagnosis not present

## 2023-12-08 ENCOUNTER — Telehealth: Payer: Self-pay | Admitting: *Deleted

## 2023-12-08 NOTE — Telephone Encounter (Signed)
Make sure she has fuov with me in next 4-6 weeks  Paula Ward    Left message for patient to call, she has an appointment with the NP on 12/28/23 and needed to move that appointment to dr crenshaw's schedule 12/21/23 at 12 noon or 1 pm.

## 2023-12-14 ENCOUNTER — Encounter: Payer: Self-pay | Admitting: Internal Medicine

## 2023-12-14 ENCOUNTER — Ambulatory Visit: Payer: Medicare PPO | Admitting: Internal Medicine

## 2023-12-14 VITALS — BP 122/58 | HR 82 | Temp 98.1°F | Ht 63.0 in | Wt 97.8 lb

## 2023-12-14 DIAGNOSIS — J9611 Chronic respiratory failure with hypoxia: Secondary | ICD-10-CM

## 2023-12-14 DIAGNOSIS — N1832 Chronic kidney disease, stage 3b: Secondary | ICD-10-CM | POA: Diagnosis not present

## 2023-12-14 DIAGNOSIS — I3139 Other pericardial effusion (noninflammatory): Secondary | ICD-10-CM

## 2023-12-14 NOTE — Patient Instructions (Signed)
 Try reducing the oxygen to 3L and see if this helps. If not let us know.

## 2023-12-14 NOTE — Progress Notes (Unsigned)
   Subjective:   Patient ID: Paula Ward, female    DOB: 06/15/1936, 88 y.o.   MRN: [id number]  HPI The patient is an 88 YO female coming in for post ER consultation. Cardiology has been talking about trying to drain fluid from around her heart and they sent her to ER for admission but then other cardiologist did not agree and she ended up going home. Nosebleed today which she was able to control. Still using oxygen and unable to use vaseline for this reason. She was advised some time ago to go to 4L O2 and she is feeling some dizziness and symptoms. She would like to go back to 3L and try as she felt well on that. Is wearing 3L today and feels improved.   Review of Systems  Constitutional:  Positive for fatigue.  HENT:  Positive for nosebleeds.   Eyes: Negative.   Respiratory:  Positive for shortness of breath. Negative for cough and chest tightness.   Cardiovascular:  Negative for chest pain, palpitations and leg swelling.  Gastrointestinal:  Negative for abdominal distention, abdominal pain, constipation, diarrhea, nausea and vomiting.  Musculoskeletal: Negative.   Skin: Negative.   Neurological: Negative.   Psychiatric/Behavioral: Negative.      Objective:  Physical Exam Constitutional:      Appearance: She is well-developed.  HENT:     Head: Normocephalic and atraumatic.  Cardiovascular:     Rate and Rhythm: Normal rate and regular rhythm.     Heart sounds: Murmur heard.  Pulmonary:     Effort: Pulmonary effort is normal. No respiratory distress.     Breath sounds: Normal breath sounds. No wheezing or rales.     Comments: Oxygen in place Abdominal:     General: Bowel sounds are normal. There is no distension.     Palpations: Abdomen is soft.     Tenderness: There is no abdominal tenderness. There is no rebound.  Musculoskeletal:     Cervical back: Normal range of motion.  Skin:    General: Skin is warm and dry.  Neurological:     Mental Status: She is alert and  oriented to person, place, and time.     Coordination: Coordination normal.     Vitals:   12/14/23 1349  BP: (!) 122/58  Pulse: 82  Temp: 98.1 F (36.7 C)  TempSrc: Oral  SpO2: 98%  Weight: 97 lb 12.8 oz (44.4 kg)  Height: 5\' 3"  (1.6 m)    Assessment & Plan:  Visit time 20 minutes in face to face communication with patient and coordination of care, additional 10 minutes spent in record review, coordination or care, ordering tests, communicating/referring to other healthcare professionals, documenting in medical records all on the same day of the visit for total time 30 minutes spent on the visit.

## 2023-12-18 ENCOUNTER — Encounter: Payer: Self-pay | Admitting: Internal Medicine

## 2023-12-18 NOTE — Assessment & Plan Note (Signed)
 Stable labs recently will not recheck today.

## 2023-12-18 NOTE — Assessment & Plan Note (Signed)
 Advised to try 3L and monitor oxygen levels and symptoms. If her O2 levels stay good and symptoms improve stay with 3L. If recurrent low O2 levels go to 4L.

## 2023-12-18 NOTE — Assessment & Plan Note (Signed)
 We discussed this in depth today that given the autoimmune disease and chronicity of this fluid it is likely to recur with simple drainage and if she needed it drained it would likely be an invasive procedure. I would only recommend this if she has a significant change in QOL to limit her QOL.

## 2023-12-24 NOTE — Progress Notes (Unsigned)
 Cardiology Clinic Note   Patient Name: Paula Ward Date of Encounter: 12/24/2023  Primary Care Provider:  [name], MD Primary Cardiologist:  Olga Millers, MD  Patient Profile    Paula Ward 88 year old female presents the clinic today for follow-up evaluation of her lower extremity edema/chronic diastolic CHF.  Past Medical History    Past Medical History:  Diagnosis Date   Anemia    Angiodysplasia of stomach and duodenum    Aortic stenosis    Arthus phenomenon    AVM (arteriovenous malformation)    Blood transfusion without reported diagnosis    Breast cancer (HCC) 1989   Left   Candida esophagitis (HCC)    Cataract    Chronic respiratory failure (HCC)    CKD stage 3b, GFR 30-44 ml/min (HCC)    Corneal edema    Corneal epithelial basement membrane dystrophy    CREST syndrome (HCC)    GERD (gastroesophageal reflux disease)    w/ HH   Interstitial lung disease (HCC)    Nodule of kidney    Pericardial effusion    PONV (postoperative nausea and vomiting)    Pulmonary embolus (HCC) 2003   Pulmonary hypertension (HCC)    Rectal incontinence    Renal cell carcinoma (HCC)    Scleroderma (HCC)    Tubular adenoma of colon    Uterine polyp    Past Surgical History:  Procedure Laterality Date   APPENDECTOMY  1962   BREAST LUMPECTOMY  1989   left   CATARACT EXTRACTION, BILATERAL Bilateral 12/2013   COLONOSCOPY WITH PROPOFOL N/A 04/15/2021   Procedure: COLONOSCOPY WITH PROPOFOL;  Surgeon: Rachael Fee, MD;  Location: [facility];  Service: Endoscopy;  Laterality: N/A;   ENTEROSCOPY N/A 01/18/2016   Procedure: ENTEROSCOPY;  Surgeon: Napoleon Form, MD;  Location: [facility];  Service: Endoscopy;  Laterality: N/A;   ENTEROSCOPY N/A 05/24/2018   Procedure: ENTEROSCOPY;  Surgeon: Lynann Bologna, MD;  Location: [facility];  Service: Endoscopy;  Laterality: N/A;   ENTEROSCOPY N/A 03/29/2021   Procedure: ENTEROSCOPY;  Surgeon: Beverley Fiedler, MD;   Location: [facility];  Service: Gastroenterology;  Laterality: N/A;   ENTEROSCOPY N/A 04/13/2021   Procedure: ENTEROSCOPY;  Surgeon: Benancio Deeds, MD;  Location: [facility];  Service: Gastroenterology;  Laterality: N/A;   ESOPHAGOGASTRODUODENOSCOPY (EGD) WITH PROPOFOL N/A 12/21/2015   Procedure: ESOPHAGOGASTRODUODENOSCOPY (EGD) WITH PROPOFOL;  Surgeon: Hilarie Fredrickson, MD;  Location: [facility];  Service: Endoscopy;  Laterality: N/A;   HOT HEMOSTASIS N/A 05/24/2018   Procedure: HOT HEMOSTASIS (ARGON PLASMA COAGULATION/BICAP);  Surgeon: Lynann Bologna, MD;  Location: [facility];  Service: Endoscopy;  Laterality: N/A;   HOT HEMOSTASIS N/A 03/29/2021   Procedure: HOT HEMOSTASIS (ARGON PLASMA COAGULATION/BICAP);  Surgeon: Beverley Fiedler, MD;  Location: [facility];  Service: Gastroenterology;  Laterality: N/A;   HOT HEMOSTASIS N/A 04/13/2021   Procedure: HOT HEMOSTASIS (ARGON PLASMA COAGULATION/BICAP);  Surgeon: Benancio Deeds, MD;  Location: [facility];  Service: Gastroenterology;  Laterality: N/A;   INTRAMEDULLARY (IM) NAIL INTERTROCHANTERIC Left 10/11/2022   Procedure: INTRAMEDULLARY (IM) NAIL INTERTROCHANTERIC;  Surgeon: Ollen Gross, MD;  Location: [facility];  Service: Orthopedics;  Laterality: Left;   IR GENERIC HISTORICAL  06/05/2016   IR RADIOLOGIST EVAL & MGMT 06/05/2016 [name], MD [facility] INTERV RAD   IVC Filter     KIDNEY SURGERY     left -"laser surgery by Dr. Fredia Sorrow- 5 yrs ago" no removal   RIGHT/LEFT HEART CATH AND CORONARY  ANGIOGRAPHY N/A 04/25/2022   Procedure: RIGHT/LEFT HEART CATH AND CORONARY ANGIOGRAPHY;  Surgeon: Tonny Bollman, MD;  Location: [facility] INVASIVE CV LAB;  Service: Cardiovascular;  Laterality: N/A;   SCHLEROTHERAPY  05/24/2018   Procedure: Theresia Majors;  Surgeon: Lynann Bologna, MD;  Location: [facility];  Service: Endoscopy;;   SUBMUCOSAL TATTOO INJECTION  04/13/2021   Procedure: SUBMUCOSAL TATTOO INJECTION;  Surgeon: Benancio Deeds, MD;   Location: [facility];  Service: Gastroenterology;;   TONSILLECTOMY     TUBAL LIGATION      Allergies  Allergies  Allergen Reactions   Codeine Nausea Only    Hallucinations, too   Other Nausea And Vomiting and Other (See Comments)    "-mycin" antibiotics.   Also cause hallucinations.   Erythromycin Nausea And Vomiting   Iodinated Contrast Media Other (See Comments)    Renal issues   Lisinopril     Pt doesn't remember reaction   Mirtazapine Other (See Comments)    "Threw me into orbit"   Mycophenolate Mofetil Nausea Only   Sulfa Antibiotics Other (See Comments)    Unknown reaction    History of Present Illness    Paula Ward 88 year old female has a PMH of essential hypertension, pulmonary hypertension, pericardial effusion, chronic diastolic CHF, AVM, moderate aortic stenosis, orthostatic hypotension, interstitial lung disease, GERD, CKD stage III, PE, incontinence, scleroderma, anemia, generalized weakness, vitamin B12 deficiency, and macrocytic anemia.  Her PMH also includes gastrointestinal bleeding secondary to AVM malformations which required IVC filter placement and breast CA.  She underwent cardiac catheterization 7/23 which showed no obstructive coronary disease, pulmonary hypertension, moderate aortic stenosis with a mean gradient of 16 mmHg.  She was seen in the emergency room on 06/04/2023 due to increased shortness of breath, chest tightness, and lower extremity edema.  Her oxygen saturation was noted to be 80% on room air.  Her BNP was 2680.  Her chest x-ray showed stable cardiomegaly and pericardial effusion with no pulmonary edema or pulmonary effusion.  Echocardiogram during that time showed LVEF of 70-75%, moderate concentric LVH, mildly reduced RV function and severely dilated LA.  Large pericardial effusion was noted.  This was slightly increased from June.  There was no evidence of tamponade or severe MAC and moderate AS was noted.  She received IV diuresis and  was discharged in stable condition 06/06/2023.  She was seen in follow-up by Reather Littler NP on 06/19/2023.  She reported that she was doing well from a cardiac standpoint.  She denied chest pain and felt her breathing had returned to baseline.  She had been monitoring her weights at home which were stable.  She was having regular lab work at the cancer center and had planned lab work on 06/30/2023.  She contacted the nurse triage line 08/04/2023 and reported that she had increased lower extremity swelling.  She reported a 10 pound weight gain since 8/15 and another 2 pound weight gain since 9/25.  She was taking furosemide 40 mg once daily.  She continued to use lower extremity support stockings.  She was instructed to increase her furosemide to 40 mg twice daily.  She was added to my schedule.  She presented to the clinic 08/06/23  for follow-up evaluation and stated she noticed a 6 pound weight gain overnight.  She reported that she was around 100 pounds earlier in the week.  Today in the office her weight is 108.2 pounds.  She reported that she was not able to take 40 mg of Lasix 2  times per day so she has been taking 60 mg of Lasix for the past 2 days.  Her right leg is greater than her left.  She has noted to have +1-2 pitting edema to her knee her right leg and generalized nonpitting edema on her left leg.  I will continued her 60 mg of Lasix x 4 days and give her 20 mill equivalents of potassium daily. I planned repeat  BMP 1 week and planned follow-up in 1 week.   She had follow-up appointment with pulmonology that week and was stable using 3 L of oxygen nasal cannula while active.  She presented to the emergency department on 08/13/2023.  She was diagnosed with right lower extremity cellulitis.  She received IV antibiotics and diuresis.  Her furosemide was transitioned to torsemide.  Her echocardiogram showed known severe LVH, G1 DD, large pericardial effusion without tamponade.  Her lower extremity  ultrasound was negative for DVT.  There was low suspicion for true cellulitis however her Keflex was continued at discharge.  She presented to the clinic  08/21/23 for follow-up evaluation and stated she had not had good urine output with torsemide.  She had been monitoring her weight at home.  She reported that when she left the hospital her weight was 93-1/2 pounds.  It had steadily increased up to 106 pounds today.  Our clinic scale showed 108 pounds.  She had been weighing after voiding in the morning with the same shoes on.  She presented with her son who asked several questions about her fluid retention and pulmonary hypertension.  All questions were answered.  I asked her to continue daily weights, stopped torsemide and resumed furosemide 60 mg daily, ordered BMP, kept follow-up for November 14, and asked her follow-up with nephrology as scheduled.  She was admitted to the hospital on 08/29/2023.  Her weight was noted to be around 102 pounds 12 ounces.  Her echocardiogram 08/31/2023 showed an LVEF of 60-65%, mildly dilated left atria, severe mitral annular calcification with mitral stenosis.  Severe calcification of the aortic valve was also noted.  She was noted to have large circumferential pericardial effusion.  No RV diastolic collapse was noted.  She had been readmitted for weight gain, orthopnea, edema.  Her chest x-ray showed improving but persistent CHF and small pleural effusion.  She received IV diuresis.  Her SGLT2 inhibitor was restarted 08/31/2023.  She was continued on spironolactone.  Close follow-up was recommended.  She presented to the clinic 09/09/23 for follow-up evaluation and stated she felt much better  since being discharged from the hospital.  Her weight had been stable at home.  She did have some bilateral ankle edema.  She had been compliant with her medications.  She had been stable with her weight.  We reviewed her medications and she felt much better since being started on  Jardiance.  We reviewed her hospitalizations and diastolic heart failure.  By discussed referral for palliative care.  She agreed to meet with them and discuss options for management/treatment.  I had her continue her weight log, gave iron rich foods list and asked her to pare them with vitamin C. We plan follow-up in 2 to 3 months.  She reported that she had upcoming appointment with hematology.   She was not able to tolerate Jardiance.  It was discontinued.  I changed her furosemide to 60 mg Monday Wednesday Friday and continue to her furosemide 40 mg dosing on Tuesday Thursday Saturday Sunday.  I planned BMP  with return to the clinic.  She presented to the clinic 10/30/23 for follow-up evaluation and stated she was not doing well with Jardiance.  She had been maintaining a weight log which showed a weight of 98.2 pounds at home.  Her weight in the clinic was 101.8 pounds.  She reported that she had not been taking spironolactone for about 3 to 4 weeks.  I refilled her spironolactone.  She was on 3-4 L of oxygen.  She had upcoming appointment with pulmonology.  She did note that she was somewhat fatigued.  She had upcoming echocardiogram planned for February.  I  decreased her Lasix to 40 mg daily,  ordered BMP and planned follow-up in 2 to 4 months.  She presented to the emergency department 12/04/2023.  With large pericardial effusion that had been diagnosed/seen on echo 12/03/2023.  Her EKG at that time showed sinus rhythm with PACs 80 bpm.  She was without complaint on exam.  She did note some mild shortness of breath.  She was maintained on 2 L of oxygen.  She denied any active chest discomfort.  She denied fever, productive cough, lightheadedness, dizziness, nausea vomiting and back pain.  Rales were noted at lung bases.  Chest x-ray showed cardiomegaly which was associated with underlying pericardial effusion.  Mild pulmonary edema was noted.  Dr. Clifton James evaluated patient and felt that her pericardial  effusion was chronic in nature.  She presents to the clinic today for follow-up evaluation and states***.  Today she denies chest pain, lower extremity edema, fatigue, palpitations, melena, hematuria, hemoptysis, diaphoresis, weakness, presyncope, syncope, orthopnea, and PND.   Home Medications    Prior to Admission medications   Medication Sig Start Date End Date Taking? Authorizing Provider  acetaminophen (TYLENOL) 325 MG tablet Take 2 tablets (650 mg total) by mouth every 6 (six) hours as needed for moderate pain. 07/18/22   Wallis Bamberg, PA-C  albuterol (VENTOLIN HFA) 108 (90 Base) MCG/ACT inhaler INHALE 2 PUFFS INTO THE LUNGS EVERY 6 HOURS AS NEEDED FOR WHEEZING OR SHORTNESS OF BREATH 03/13/23   [name], MD  augmented betamethasone dipropionate (DIPROLENE-AF) 0.05 % cream Apply topically 2 (two) times daily as needed. 07/13/23   [provider]  clobetasol ointment (TEMOVATE) 0.05 % Apply 1 Application topically 2 (two) times daily. 04/20/23   [provider]  Cyanocobalamin (VITAMIN B-12) 1000 MCG SUBL Place 1,000 mcg under the tongue. Taking twice a week    [provider]  docusate sodium (COLACE) 100 MG capsule Take 1 capsule (100 mg total) by mouth 2 (two) times daily. Patient taking differently: Take 100 mg by mouth 2 (two) times daily. Pt takes every morning 10/15/22   Edmisten, Kristie L, PA  famotidine (PEPCID) 20 MG tablet Take 1 tablet (20 mg total) by mouth at bedtime. 02/10/23   Napoleon Form, MD  fluticasone (FLONASE) 50 MCG/ACT nasal spray Place 1 spray into both nostrils daily as needed for allergies. Patient taking differently: Place 1 spray into both nostrils in the morning and at bedtime. 05/15/22   [name], MD  furosemide (LASIX) 20 MG tablet Take 2 tablets (40 mg total) by mouth daily. 06/19/23   Reather Littler D, NP  guaiFENesin (MUCINEX) 600 MG 12 hr tablet Take 300 mg by mouth daily.    [provider]   ipratropium (ATROVENT) 0.03 % nasal spray Place 2 sprays into both nostrils every 12 (twelve) hours. 05/12/22   Martina Sinner, MD  latanoprost Harrel Lemon)  0.005 % ophthalmic solution Place 1 drop into both eyes at bedtime. 09/23/22   [provider]  magic mouthwash SOLN Take 10 mLs by mouth 4 (four) times daily as needed for mouth pain. 03/22/22   Burnadette Pop, MD  mirtazapine (REMERON) 15 MG tablet Take 1 tablet (15 mg total) by mouth at bedtime. 03/13/23   [name], MD  montelukast (SINGULAIR) 10 MG tablet TAKE 1 TABLET BY MOUTH AT BEDTIME 07/31/23   [name], MD  Multiple Vitamin (MULTIVITAMIN) capsule Take 1 capsule by mouth daily.    [provider]  mupirocin ointment (BACTROBAN) 2 % Apply 1 Application topically 2 (two) times daily as needed (dry skin).    [provider]  OPSUMIT 10 MG tablet Take 1 tablet (10 mg total) by mouth daily. 07/07/23   Parrett, Virgel Bouquet, NP  OXYGEN Inhale into the lungs. 2 LMP at night and upon exertion    [provider]  pantoprazole (PROTONIX) 40 MG tablet Take 1 tablet (40 mg total) by mouth daily. 02/10/23   Napoleon Form, MD  polyethylene glycol (MIRALAX / GLYCOLAX) 17 g packet Take 17 g by mouth daily. 10/15/22   Edmisten, Lyn Hollingshead, PA  spironolactone (ALDACTONE) 25 MG tablet Take 1 tablet (25 mg total) by mouth daily. 08/03/23   Gwenlyn Fudge, FNP  sucralfate (CARAFATE) 1 GM/10ML suspension TAKE 10 MLS BY MOUTH EVERY 6 HOURS AS NEEDED FOR REFLUX 02/11/23   Napoleon Form, MD  triamcinolone cream (KENALOG) 0.1 % Apply 1 Application topically 2 (two) times daily. 07/07/23   [provider]    Family History    Family History  Problem Relation Age of Onset   Bladder Cancer Father    Diabetes Father    Prostate cancer Father    Alzheimer's disease Mother    Diabetes Sister    Lung cancer Sister         smoker   Esophageal cancer Paternal Uncle    Colon cancer Neg Hx     She indicated that her mother is deceased. She indicated that her father is deceased. She indicated that the status of her sister is unknown. She indicated that the status of her paternal uncle is unknown. She indicated that the status of her neg hx is unknown.  Social History    Social History   Socioeconomic History   Marital status: Married    Spouse name: Not on file   Number of children: 2   Years of education: Not on file   Highest education level: Not on file  Occupational History   Occupation: Retired  Tobacco Use   Smoking status: Never   Smokeless tobacco: Never  Vaping Use   Vaping status: Never Used  Substance and Sexual Activity   Alcohol use: No   Drug use: No   Sexual activity: Not Currently  Other Topics Concern   Not on file  Social History Narrative   Married '611 son- '65, 1 daughter '63; 6 children (2 adopted)SO- SOBRetirement- doing well. Marriage in good health. Patient has never smoked. Alcohol use- noPt gets regular exercise   Social Drivers of Health   Financial Resource Strain: Low Risk  (04/02/2022)   Overall Financial Resource Strain (CARDIA)    Difficulty of Paying Living Expenses: Not hard at all  Food Insecurity: No Food Insecurity (09/04/2023)   Hunger Vital Sign    Worried About Running Out of Food in the Last Year: Never true  Ran Out of Food in the Last Year: Never true  Transportation Needs: No Transportation Needs (09/04/2023)   PRAPARE - Administrator, Civil Service (Medical): No    Lack of Transportation (Non-Medical): No  Physical Activity: Inactive (04/02/2022)   Exercise Vital Sign    Days of Exercise per Week: 0 days    Minutes of Exercise per Session: 0 min  Stress: No Stress Concern Present (04/02/2022)   [employer] of Occupational Health - Occupational Stress Questionnaire    Feeling of Stress : Not at all  Social Connections: Socially Integrated (04/02/2022)   Social Connection and Isolation Panel  [NHANES]    Frequency of Communication with Friends and Family: More than three times a week    Frequency of Social Gatherings with Friends and Family: More than three times a week    Attends Religious Services: More than 4 times per year    Active Member of [employer] or Organizations: Yes    Attends Engineer, structural: More than 4 times per year    Marital Status: Married  Catering manager Violence: Not At Risk (09/04/2023)   Humiliation, Afraid, Rape, and Kick questionnaire    Fear of Current or Ex-Partner: No    Emotionally Abused: No    Physically Abused: No    Sexually Abused: No     Review of Systems    General:  No chills, fever, night sweats or weight changes.  Cardiovascular:  No chest pain, dyspnea on exertion, edema, orthopnea, palpitations, paroxysmal nocturnal dyspnea. Dermatological: No rash, lesions/masses Respiratory: No cough, dyspnea Urologic: No hematuria, dysuria Abdominal:   No nausea, vomiting, diarrhea, bright red blood per rectum, melena, or hematemesis Neurologic:  No visual changes, wkns, changes in mental status. All other systems reviewed and are otherwise negative except as noted above.  Physical Exam    VS:  There were no vitals taken for this visit. , BMI There is no height or weight on file to calculate BMI. GEN: Well nourished, well developed, in no acute distress. HEENT: normal. Neck: Supple, no JVD, carotid bruits, or masses. Cardiac: RRR, 3/6 systolicmurmur heard along right sternal border., rubs, or gallops. No clubbing, cyanosis, euvolemic.  Radials/DP/PT 2+ and equal bilaterally.  Respiratory:  Respirations regular and unlabored,  GI: Soft, nontender, nondistended, BS + x 4. MS: no deformity or atrophy. Skin: warm and dry, no rash. Neuro:  Strength and sensation are intact. Psych: Normal affect.  Accessory Clinical Findings    Recent Labs: 06/03/2023: Pro B Natriuretic peptide (BNP) 2,680.0 09/03/2023: Magnesium 2.4 12/04/2023:  ALT 16; B Natriuretic Peptide 1,686.5; BUN 62; Creatinine, Ser 1.55; Hemoglobin 9.8; Platelets 192; Potassium 4.5; Sodium 138   Recent Lipid Panel    Component Value Date/Time   CHOL 158 06/24/2018 1004   TRIG 112.0 06/24/2018 1004   HDL 57.50 06/24/2018 1004   CHOLHDL 3 06/24/2018 1004   VLDL 22.4 06/24/2018 1004   LDLCALC 78 06/24/2018 1004    No BP recorded.  {Refresh Note OR Click here to enter BP  :1}***    ECG personally reviewed by me today-  none today.     Echocardiogram 06/05/2023  IMPRESSIONS     1. Left ventricular ejection fraction, by estimation, is 70 to 75%. The  left ventricle has hyperdynamic function. The left ventricle has no  regional wall motion abnormalities. There is moderate concentric left  ventricular hypertrophy. Indeterminate  diastolic filling due to E-A fusion.   2. Right ventricular systolic function is  mildly reduced. The right  ventricular size is normal.   3. Left atrial size was severely dilated.   4. Large pericardial effusion surrounds heart, mainly posteriorly and  lateral to LV. Naxunak dimension is 5.6 cm posteriorly. It has increased  in size from echo done in June 2024. Does not meet criteria for tamponade  by echo criteria. Clinical  correlation indicated .Marland Kitchen Large pericardial effusion.   5. MV annulus is severely calcified. MV is thickened with restricted  motion. Difficult to see well Mean gradient through the MV is 8 mm Hg. MVA  (Pt1/2 ) 3 cm2. Compared to echo report from June 2024, mean gradient is  incresaed (5 to 8 mm Hg) Consider  TEE to evaluate further.. Trivial mitral valve regurgitation.   6. AV is thickened, calcified with restricted motion. Peak and mean  gradients through the valve are 59 and 37 mm Hg respectively.  Dimensionless index is 0.33 consistent with moderate AS.Marland Kitchen The aortic valve  is abnormal. Aortic valve regurgitation is not  visualized.   7. The inferior vena cava is normal in size with <50%  respiratory  variability, suggesting right atrial pressure of 8 mmHg.   FINDINGS   Left Ventricle: Left ventricular ejection fraction, by estimation, is 70  to 75%. The left ventricle has hyperdynamic function. The left ventricle  has no regional wall motion abnormalities. The left ventricular internal  cavity size was small. There is  moderate concentric left ventricular hypertrophy. Indeterminate diastolic  filling due to E-A fusion.   Right Ventricle: The right ventricular size is normal. Right vetricular  wall thickness was not assessed. Right ventricular systolic function is  mildly reduced.   Left Atrium: Left atrial size was severely dilated.   Right Atrium: Right atrial size was normal in size.   Pericardium: Large pericardial effusion surrounds heart, mainly  posteriorly and lateral to LV. Naxunak dimension is 5.6 cm posteriorly. It  has increased in size from echo done in June 2024. Does not meet criteria  for tamponade by echo criteria. Clinical  correlation indicated. A large pericardial effusion is present.   Mitral Valve: MV annulus is severely calcified. MV is thickened with  restricted motion. Difficult to see well Mean gradient through the MV is 8  mm Hg. MVA (Pt1/2 ) 3 cm2. Compared to echo report from June 2024, mean  gradient is incresaed (5 to 8 mm Hg)  Consider TEE to evaluate further. Trivial mitral valve regurgitation. MV  peak gradient, 13.2 mmHg. The mean mitral valve gradient is 8.0 mmHg.   Tricuspid Valve: The tricuspid valve is normal in structure. Tricuspid  valve regurgitation is mild.   Aortic Valve: AV is thickened, calcified with restricted motion. Peak and  mean gradients through the valve are 59 and 37 mm Hg respectively.  Dimensionless index is 0.33 consistent with moderate AS. The aortic valve  is abnormal. Aortic valve regurgitation   is not visualized. Aortic valve mean gradient measures 37.0 mmHg. Aortic  valve peak gradient measures  58.7 mmHg. Aortic valve area, by VTI measures  0.95 cm.   Pulmonic Valve: The pulmonic valve was not well visualized. Pulmonic valve  regurgitation is mild.   Aorta: The aortic root and ascending aorta are structurally normal, with  no evidence of dilitation.   Venous: The inferior vena cava is normal in size with less than 50%  respiratory variability, suggesting right atrial pressure of 8 mmHg.   IAS/Shunts: No atrial level shunt detected by color flow Doppler.  Echocardiogram 08/15/2023 IMPRESSIONS     1. Left ventricular ejection fraction, by estimation, is >75%. The left  ventricle has hyperdynamic function. The left ventricle has no regional  wall motion abnormalities. There is severe left ventricular hypertrophy.  Left ventricular diastolic  parameters are consistent with Grade I diastolic dysfunction (impaired  relaxation).   2. Right ventricular systolic function is normal. The right ventricular  size is normal.   3. Left atrial size was severely dilated.   4. Large pericardial effusion. The pericardial effusion is  circumferential. There is no evidence of cardiac tamponade.   5. The mitral valve is normal in structure. No evidence of mitral valve  regurgitation. No evidence of mitral stenosis. Severe mitral annular  calcification.   6. The aortic valve is normal in structure. There is severe calcifcation  of the aortic valve. There is moderate thickening of the aortic valve.  Aortic valve regurgitation is not visualized. Mild aortic valve stenosis.  Aortic valve mean gradient measures   14.8 mmHg. Aortic valve Vmax measures 2.59 m/s.   7. The inferior vena cava is normal in size with greater than 50%  respiratory variability, suggesting right atrial pressure of 3 mmHg.   Comparison(s): Large pericardial effusion noted on prior ECHO, no major  change.   Conclusion(s)/Recommendation(s): Findings consistent with hypertrophic  cardiomyopathy.   FINDINGS   Left  Ventricle: Left ventricular ejection fraction, by estimation, is  >75%. The left ventricle has hyperdynamic function. The left ventricle has  no regional wall motion abnormalities. The left ventricular internal  cavity size was normal in size. There  is severe left ventricular hypertrophy. Left ventricular diastolic  parameters are consistent with Grade I diastolic dysfunction (impaired  relaxation).   Right Ventricle: The right ventricular size is normal. No increase in  right ventricular wall thickness. Right ventricular systolic function is  normal.   Left Atrium: Left atrial size was severely dilated.   Right Atrium: Right atrial size was normal in size.   Pericardium: A large pericardial effusion is present. The pericardial  effusion is circumferential. There is no evidence of cardiac tamponade.   Mitral Valve: The mitral valve is normal in structure. Severe mitral  annular calcification. No evidence of mitral valve regurgitation. No  evidence of mitral valve stenosis.   Tricuspid Valve: The tricuspid valve is normal in structure. Tricuspid  valve regurgitation is mild . No evidence of tricuspid stenosis.   Aortic Valve: The aortic valve is normal in structure. There is severe  calcifcation of the aortic valve. There is moderate thickening of the  aortic valve. Aortic valve regurgitation is not visualized. Mild aortic  stenosis is present. Aortic valve mean  gradient measures 14.8 mmHg. Aortic valve peak gradient measures 26.9  mmHg. Aortic valve area, by VTI measures 1.12 cm.   Pulmonic Valve: The pulmonic valve was normal in structure. Pulmonic valve  regurgitation is not visualized. No evidence of pulmonic stenosis.   Aorta: The aortic root is normal in size and structure.   Venous: The inferior vena cava is normal in size with greater than 50%  respiratory variability, suggesting right atrial pressure of 3 mmHg.   IAS/Shunts: No atrial level shunt detected by color  flow Doppler.   Echocardiogram 08/31/2023 IMPRESSIONS     1. Left ventricular ejection fraction, by estimation, is 60 to 65%. The  left ventricle has normal function. The left ventricle has no regional  wall motion abnormalities.   2. Right ventricular systolic function is normal. The right  ventricular  size is normal.   3. Left atrial size was mildly dilated.   4. Severe mitral annular calcification. Visually there is mitral stenosis  but doppler interrogation was not done.   5. The aortic valve is tricuspid. There is severe calcifcation of the  aortic valve. Visually there is aortic stenosis but doppler interrogation  was not done.   6. The inferior vena cava is normal in size with <50% respiratory  variability, suggesting right atrial pressure of 8 mmHg.   7. There was a large circumferential pericardial effusion. There was no  RV diastolic collapse noted. Mitral E inflow velocity showed < 25%  respirophasic variation. The IVC was not significantly dilated. There does  not appear to be tamponade physiology.   8. Limited echo.   FINDINGS   Left Ventricle: Left ventricular ejection fraction, by estimation, is 60  to 65%. The left ventricle has normal function. The left ventricle has no  regional wall motion abnormalities. The left ventricular internal cavity  size was normal in size.   Right Ventricle: The right ventricular size is normal. No increase in  right ventricular wall thickness. Right ventricular systolic function is  normal.   Left Atrium: Left atrial size was mildly dilated.   Right Atrium: Right atrial size was normal in size.   Pericardium: There was a large circumferential pericardial effusion. There  was no RV diastolic collapse noted. Mitral E inflow velocity showed < 25%  respirophasic variation. The IVC was not significantly dilated. There does  not appear to be tamponade  physiology.   Mitral Valve: There is moderate calcification of the mitral valve   leaflet(s). Severe mitral annular calcification.   Aortic Valve: The aortic valve is tricuspid. There is severe calcifcation  of the aortic valve.   Venous: The inferior vena cava is normal in size with less than 50%  respiratory variability, suggesting right atrial pressure of 8 mmHg.   Additional Comments: Spectral Doppler performed. Color Doppler performed.    IVC  IVC diam: 1.50 cm   Dalton McleanMD  Electronically signed by Wilfred Lacy  Signature Date/Time: 08/31/2023/4:29:29 PM      Echocardiogram 12/03/2023 IMPRESSIONS     1. Left ventricular ejection fraction, by estimation, is 70 to 75%. The  left ventricle has hyperdynamic function. The left ventricle has no  regional wall motion abnormalities. There is severe concentric left  ventricular hypertrophy. Left ventricular  diastolic parameters are consistent with Grade I diastolic dysfunction  (impaired relaxation). Elevated left ventricular end-diastolic pressure.   2. Right ventricular systolic function is normal. The right ventricular  size is normal.   3. There effusion measures 5.06cm at largest diameter posteriorly and  2.29cm anteriorly.. Large pericardial effusion. The pericardial effusion  is circumferential.   4. The mitral valve is normal in structure. Mild mitral valve  regurgitation. Mild to moderate mitral stenosis. The mean mitral valve  gradient is 6.0 mmHg.   5. Tricuspid valve regurgitation is moderate.   6. The aortic valve is tricuspid. There is moderate calcification of the  aortic valve. There is moderate thickening of the aortic valve. Aortic  valve regurgitation is not visualized. Moderate aortic valve stenosis.  Aortic valve area, by VTI measures  1.17 cm. Aortic valve mean gradient measures 21.0 mmHg. Aortic valve Vmax  measures 3.14 m/s.   7. Compared to study dated 01/15/23 the pericardial effusion has increased  in diameter posteriorly (4cm prior andnow 5cm) and there is now a 40mm   respirophasic change  in MV inflow velocity (2mm on prior echo). There is  no overt diastolic RV collapse, but   both the RV and LV are very underfilled. There is also now moderate AS.   8. Case discussed with Dr. Jens Som who recommended patient come in to  office 2/14 for DOD visit if asymptomatic today.   FINDINGS   Left Ventricle: Left ventricular ejection fraction, by estimation, is 70  to 75%. The left ventricle has hyperdynamic function. The left ventricle  has no regional wall motion abnormalities. Strain imaging was not  performed. The left ventricular internal  cavity size was normal in size. There is severe concentric left  ventricular hypertrophy. Left ventricular diastolic parameters are  consistent with Grade I diastolic dysfunction (impaired relaxation).  Elevated left ventricular end-diastolic pressure.   Right Ventricle: The right ventricular size is normal. No increase in  right ventricular wall thickness. Right ventricular systolic function is  normal.   Left Atrium: Left atrial size was normal in size.   Right Atrium: Right atrial size was normal in size.   Pericardium: There effusion measures 5.06cm at largest diameter  posteriorly and 2.29cm anteriorly. A large pericardial effusion is  present. The pericardial effusion is circumferential. There is diastolic  collapse of the right atrial wall, diastolic  collapse of the right ventricular free wall and excessive respiratory  variation in the mitral valve spectral Doppler velocities.   Mitral Valve: The mitral valve is normal in structure. Mild mitral valve  regurgitation. Mild to moderate mitral valve stenosis. MV peak gradient,  12.1 mmHg. The mean mitral valve gradient is 6.0 mmHg.   Tricuspid Valve: The tricuspid valve is normal in structure. Tricuspid  valve regurgitation is moderate . No evidence of tricuspid stenosis.   Aortic Valve: The aortic valve is tricuspid. There is moderate  calcification of  the aortic valve. There is moderate thickening of the  aortic valve. Aortic valve regurgitation is not visualized. Moderate  aortic stenosis is present. Aortic valve mean  gradient measures 21.0 mmHg. Aortic valve peak gradient measures 39.4  mmHg. Aortic valve area, by VTI measures 1.17 cm.   Pulmonic Valve: The pulmonic valve was normal in structure. Pulmonic valve  regurgitation is mild. No evidence of pulmonic stenosis.   Aorta: The aortic root is normal in size and structure.   Venous: The inferior vena cava was not well visualized.   IAS/Shunts: No atrial level shunt detected by color flow Doppler.     Cardiac catheterization 04/25/2022  1.  Ectatic but patent coronary arteries with no obstructive disease.  Right dominant coronary artery. 2.  Mild pulmonary hypertension with PA pressure 52/15, mean 28 mmHg, transpulmonary gradient 10 mmHg, PVR approximately 2 Wood units. 3.  Moderate aortic stenosis with mean transvalvular gradient 16 mmHg calculated aortic valve area 1.16 cm 4.  Heavy mitral annular calcification by plain fluoroscopy 5.  Mild aortic valve calcification by plain fluoroscopy   Recommend: Continued medical therapy, clinical follow-up, echo surveillance.  The patient does not appear to have severe aortic stenosis and I do not think she would benefit from TAVR at this time.   Diagnostic Dominance: Right  Intervention   Assessment & Plan   1.  Pericardial effusion-no increased work of breathing.  Followed up in emergency department.  Pericardial effusion felt to be chronic.  Breathing at baseline on 2 L of nasal cannula oxygen. Plan for repeat echocardiogram in 1 year. Continue current medical therapy  Acute on chronic diastolic CHF- Wt today 101.***12oz.  chronic bilateral ankle nonpitting edema stable.  Previously was not able to tolerate Jardiance medication.   Continue  spironolactone, Lasix to 40 mg daily Heart healthy low-sodium diet Continue lower  extremity support stockings Continue daily weights  Essential hypertension-BP today 124***/64 Maintain blood pressure log Continue current medical therapy Heart healthy low-sodium diet  Pulmonary hypertension-on 2 L nasal cannula.  Stable.  Follows with [facility] pulmonary hypertension clinic. Using 3-4L of O2 in clinic today.  Not wearing oxygen in room during exam. Continue current medical therapy  Aortic stenosis-continues to use 2 L of oxygen via nasal cannula.   Continue furosemide, spironolactone Increase physical activity as tolerated  CKD stage III-creatinine 1.55 on 12/04/23.    Avoid nephrotoxic agents.   Follows with PCP/nephrology   Disposition: Follow-up with Dr. Jens Som or me in 4-6 month.   Thomasene Ripple. Vicci Reder NP-C     12/24/2023, 1:06 PM [facility] [address] Office [phone] Fax [phone]    I spent 14*** minutes examining this patient, reviewing medications, and using patient centered shared decision making involving her cardiac care.   I spent greater than 20 minutes reviewing her past medical history,  medications, and prior cardiac tests.

## 2023-12-25 ENCOUNTER — Encounter (HOSPITAL_BASED_OUTPATIENT_CLINIC_OR_DEPARTMENT_OTHER): Payer: Self-pay

## 2023-12-28 ENCOUNTER — Ambulatory Visit: Payer: Medicare PPO | Attending: General Practice | Admitting: General Practice

## 2023-12-28 ENCOUNTER — Encounter: Payer: Self-pay | Admitting: General Practice

## 2023-12-28 VITALS — BP 102/48 | HR 94 | Ht 63.0 in | Wt 101.0 lb

## 2023-12-28 DIAGNOSIS — I10 Essential (primary) hypertension: Secondary | ICD-10-CM | POA: Diagnosis not present

## 2023-12-28 DIAGNOSIS — I350 Nonrheumatic aortic (valve) stenosis: Secondary | ICD-10-CM | POA: Diagnosis not present

## 2023-12-28 DIAGNOSIS — N1832 Chronic kidney disease, stage 3b: Secondary | ICD-10-CM

## 2023-12-28 DIAGNOSIS — I5033 Acute on chronic diastolic (congestive) heart failure: Secondary | ICD-10-CM | POA: Diagnosis not present

## 2023-12-28 DIAGNOSIS — I272 Pulmonary hypertension, unspecified: Secondary | ICD-10-CM | POA: Diagnosis not present

## 2023-12-28 DIAGNOSIS — I3139 Other pericardial effusion (noninflammatory): Secondary | ICD-10-CM | POA: Diagnosis not present

## 2023-12-28 NOTE — Patient Instructions (Signed)
 Medication Instructions:  The current medical regimen is effective;  continue present plan and medications as directed. Please refer to the Current Medication list given to you today.  *If you need a refill on your cardiac medications before your next appointment, please call your pharmacy*  Lab Work:  Testing/Procedures: NONE  Follow-Up: At [facility], you and your health needs are our priority.  As part of our continuing mission to provide you with exceptional heart care, we have created designated Provider Care Teams.  These Care Teams include your primary Cardiologist (physician) and Advanced Practice Providers (APPs -  Physician Assistants and Nurse Practitioners) who all work together to provide you with the care you need, when you need it.  Your next appointment:   4-6 month(s)  Provider:   Olga Millers, MD  or Edd Fabian, FNP-C

## 2023-12-30 ENCOUNTER — Other Ambulatory Visit: Payer: Self-pay | Admitting: Hematology and Oncology

## 2023-12-30 ENCOUNTER — Inpatient Hospital Stay: Payer: Medicare PPO | Attending: Physician Assistant

## 2023-12-30 ENCOUNTER — Inpatient Hospital Stay: Payer: Medicare PPO

## 2023-12-30 DIAGNOSIS — E538 Deficiency of other specified B group vitamins: Secondary | ICD-10-CM

## 2023-12-30 DIAGNOSIS — D539 Nutritional anemia, unspecified: Secondary | ICD-10-CM | POA: Diagnosis not present

## 2023-12-30 LAB — CBC WITH DIFFERENTIAL (CANCER CENTER ONLY)
Abs Immature Granulocytes: 0.03 10*3/uL (ref 0.00–0.07)
Basophils Absolute: 0.1 10*3/uL (ref 0.0–0.1)
Basophils Relative: 1 %
Eosinophils Absolute: 0.2 10*3/uL (ref 0.0–0.5)
Eosinophils Relative: 3 %
HCT: 30.7 % — ABNORMAL LOW (ref 36.0–46.0)
Hemoglobin: 9.6 g/dL — ABNORMAL LOW (ref 12.0–15.0)
Immature Granulocytes: 1 %
Lymphocytes Relative: 14 %
Lymphs Abs: 0.7 10*3/uL (ref 0.7–4.0)
MCH: 36 pg — ABNORMAL HIGH (ref 26.0–34.0)
MCHC: 31.3 g/dL (ref 30.0–36.0)
MCV: 115 fL — ABNORMAL HIGH (ref 80.0–100.0)
Monocytes Absolute: 0.3 10*3/uL (ref 0.1–1.0)
Monocytes Relative: 6 %
Neutro Abs: 4 10*3/uL (ref 1.7–7.7)
Neutrophils Relative %: 75 %
Platelet Count: 149 10*3/uL — ABNORMAL LOW (ref 150–400)
RBC: 2.67 MIL/uL — ABNORMAL LOW (ref 3.87–5.11)
RDW: 12.5 % (ref 11.5–15.5)
WBC Count: 5.2 10*3/uL (ref 4.0–10.5)
nRBC: 0 % (ref 0.0–0.2)

## 2023-12-30 LAB — CMP (CANCER CENTER ONLY)
ALT: 12 U/L (ref 0–44)
AST: 26 U/L (ref 15–41)
Albumin: 4.3 g/dL (ref 3.5–5.0)
Alkaline Phosphatase: 64 U/L (ref 38–126)
Anion gap: 7 (ref 5–15)
BUN: 54 mg/dL — ABNORMAL HIGH (ref 8–23)
CO2: 36 mmol/L — ABNORMAL HIGH (ref 22–32)
Calcium: 9.2 mg/dL (ref 8.9–10.3)
Chloride: 95 mmol/L — ABNORMAL LOW (ref 98–111)
Creatinine: 1.42 mg/dL — ABNORMAL HIGH (ref 0.44–1.00)
GFR, Estimated: 36 mL/min — ABNORMAL LOW (ref >60)
Glucose, Bld: 101 mg/dL — ABNORMAL HIGH (ref 70–99)
Potassium: 5 mmol/L (ref 3.5–5.1)
Sodium: 138 mmol/L (ref 135–145)
Total Bilirubin: 0.6 mg/dL (ref 0.0–1.2)
Total Protein: 6.9 g/dL (ref 6.5–8.1)

## 2023-12-30 LAB — VITAMIN B12: Vitamin B-12: 588 pg/mL (ref 180–914)

## 2023-12-30 LAB — FERRITIN: Ferritin: 1108 ng/mL — ABNORMAL HIGH (ref 11–307)

## 2023-12-30 LAB — LACTATE DEHYDROGENASE: LDH: 96 U/L — ABNORMAL LOW (ref 98–192)

## 2023-12-30 MED ORDER — CYANOCOBALAMIN 1000 MCG/ML IJ SOLN
1000.0000 ug | Freq: Once | INTRAMUSCULAR | Status: AC
  Administered 2023-12-30: 1000 ug via INTRAMUSCULAR
  Filled 2023-12-30: qty 1

## 2023-12-31 ENCOUNTER — Ambulatory Visit: Payer: Self-pay

## 2023-12-31 DIAGNOSIS — J849 Interstitial pulmonary disease, unspecified: Secondary | ICD-10-CM

## 2023-12-31 DIAGNOSIS — I509 Heart failure, unspecified: Secondary | ICD-10-CM | POA: Diagnosis not present

## 2023-12-31 NOTE — Telephone Encounter (Signed)
 See office visit from 12/28/23

## 2023-12-31 NOTE — Patient Instructions (Signed)
 Visit Information  Thank you for taking time to visit with me today. Please don't hesitate to contact me if I can be of assistance to you.   Following are the goals we discussed today:  Continue to take medications as prescribed. Continue to attend provider visits as scheduled Continue to eat healthy, lean meats, vegetables, fruits, avoid saturated and transfats Contact provider with health questions or concerns as needed   Our next appointment is by telephone on 01/25/24 at 2:30 pm  Please call the care guide team at [phone] if you need to cancel or reschedule your appointment.   If you are experiencing a Mental Health or Behavioral Health Crisis or need someone to talk to, please call the Suicide and Crisis Lifeline: 988 call the [place] National Suicide Prevention Lifeline: 684-359-3197 or TTY: 253-885-2100 TTY [phone]) to talk to a trained counselor  Kathyrn Sheriff, RN, MSN, BSN, CCM [place]  [facility], Population Health Case Manager Phone: [phone]

## 2023-12-31 NOTE — Patient Outreach (Signed)
 Care Coordination   Follow Up Visit Note   12/31/2023 Name: Paula Ward MRN: [id number] DOB: 1936/10/09  Paula Ward is a 88 y.o. year old female who sees Paula Broker, MD for primary care. I spoke with  Paula Ward by phone today.  What matters to the patients health and wellness today?  Paula Ward reports "I am doing pretty Paula Ward is doing well. She states she is breathing better than she has in a while. She states, "I finally got rid of the fluid in my legs". She reports private pay in home care assistance with [employer] is a big help to her. She continues to use O2 at 3-4 L/day. She reports improvement in breathing since starting Brovana nebulizer. Paula Ward states she does not have Yupelri. She states she just opened up her mail with a denial letter from her insurance company re: Paula Ward. She request assistance from clinical pharmacist.  Goals Addressed             This Visit's Progress    Assist with management of Health Conditions       Interventions Today    Flowsheet Row Most Recent Value  Chronic Disease   Chronic disease during today's visit Other, Chronic Kidney Disease/End Stage Renal Disease (ESRD), Hypertension (HTN), Congestive Heart Failure (CHF)  [ILD, Pulmonary HTN]  General Interventions   General Interventions Discussed/Reviewed General Interventions Reviewed, Doctor Visits  [Evaluation of current treatment plan for health condition and patient's adherence to plan]  Doctor Visits Discussed/Reviewed PCP, Specialist  PCP/Specialist Visits Compliance with follow-up visit  [reviewed upcoming scheduled appointments with patient]  Education Interventions   Education Provided Provided Education  Provided Verbal Education On When to see the doctor, Medication  [advised to eat healthy, take medications as prescribed, attend provider appointments as scheduled, contact provider with health questions/concerns as needed.]  Nutrition Interventions    Nutrition Discussed/Reviewed Nutrition Discussed  Pharmacy Interventions   Pharmacy Dicussed/Reviewed Pharmacy Topics Reviewed, Affording Medications, Referral to Pharmacist  Referral to Pharmacist Cannot afford medications  [patient reports Yupelri nebulizer prescribed, however received denial letter from insurance]  Advanced Directive Interventions   Advanced Directives Discussed/Reviewed End of Life  End of Life Palliative  [Palliative care discussed with patient. Paitent states she is aware of the differences between palliative care and hospice. she denies need for palliatve care at this time.]            SDOH assessments and interventions completed:  Yes  SDOH Interventions Today    Flowsheet Row Most Recent Value  SDOH Interventions   Food Insecurity Interventions Intervention Not Indicated  Housing Interventions Intervention Not Indicated  Transportation Interventions Intervention Not Indicated  Utilities Interventions Intervention Not Indicated      Care Coordination Interventions:  Yes, provided   Follow up plan: Follow up call scheduled for 01/25/24    Encounter Outcome:  Patient Visit Completed   Kathyrn Sheriff, RN, MSN, BSN, CCM [place]  [facility], Population Health Case Manager Phone: [phone]

## 2024-01-03 DIAGNOSIS — I509 Heart failure, unspecified: Secondary | ICD-10-CM | POA: Diagnosis not present

## 2024-01-05 ENCOUNTER — Telehealth: Payer: Self-pay | Admitting: *Deleted

## 2024-01-05 NOTE — Progress Notes (Signed)
 Care Guide Pharmacy Note  01/05/2024 Name: Paula Ward MRN: [id number] DOB: [birth year]  Referred By: [name], MD Reason for referral: Care Coordination (Outreach to schedule referral with pharmacist )   Paula Ward is a 88 y.o. year old female who is a primary care patient of [name], MD.  [name] was referred to the pharmacist for assistance related to:  med assistance  Successful contact was made with the patient to discuss pharmacy services including being ready for the pharmacist to call at least 5 minutes before the scheduled appointment time and to have medication bottles and any blood pressure readings ready for review. The patient agreed to meet with the pharmacist via telephone visit on 01/22/2024  Burman Nieves, CMA, Care Guide [facility], [facility] Guide Direct Dial: [phone]  Fax: [phone] Website: [email or web address]

## 2024-01-06 ENCOUNTER — Ambulatory Visit

## 2024-01-06 DIAGNOSIS — M79673 Pain in unspecified foot: Secondary | ICD-10-CM

## 2024-01-06 LAB — METHYLMALONIC ACID, SERUM: Methylmalonic Acid, Quantitative: 691 nmol/L — ABNORMAL HIGH (ref 0–378)

## 2024-01-06 NOTE — Progress Notes (Signed)
 Patient was in and picked up Left shoe with added lift patient was happy with modification helping to restore symmetry and balance  Patient also picked up slippers and very much loved them  Patient will call if any problems arise

## 2024-01-15 DIAGNOSIS — G453 Amaurosis fugax: Secondary | ICD-10-CM | POA: Diagnosis not present

## 2024-01-15 DIAGNOSIS — H5310 Unspecified subjective visual disturbances: Secondary | ICD-10-CM | POA: Diagnosis not present

## 2024-01-18 ENCOUNTER — Telehealth: Payer: Self-pay | Admitting: Pulmonary Disease

## 2024-01-18 NOTE — Telephone Encounter (Signed)
 Spoke to patient. She is requesting update on Yupelri.  Per 11/11/2023 phone note, Mikael Spray was denied by insurance.   Dr. Judeth Horn, please advise if you would like to appeal or switch to alternative.

## 2024-01-18 NOTE — Telephone Encounter (Signed)
 Please see last signed tel encounter and call PT to advise on Yupelri RX that she needs.What happened on it, she wanted to know. TY. [phone]

## 2024-01-19 NOTE — Telephone Encounter (Signed)
 There is no alternative. Not sure there is a pathway forward since it was denied by insurance.

## 2024-01-20 NOTE — Telephone Encounter (Signed)
 Pt is aware of below message and voiced her understanding.  She will discuss further at upcoming appt. Nothing further needed.

## 2024-01-21 ENCOUNTER — Telehealth: Payer: Self-pay

## 2024-01-21 ENCOUNTER — Ambulatory Visit: Payer: Self-pay

## 2024-01-21 NOTE — Patient Outreach (Signed)
 Care Coordination   Follow Up Visit Note   01/21/2024 Name: Paula Ward MRN: [id number] DOB: 1935-10-24  Paula Ward is a 88 y.o. year old female who sees [name], MD for primary care. I spoke with  Paula Ward by phone today.  What matters to the patients health and wellness today?  RNCM returned call to patient. Paula Ward reports, "I just don't feel good". She reports "no energy" and states she has been "staggering". She reports she is having to use a cane today, but does not usually have to use a cane. Per patient, she is using O2 at 4L(reports is her norm) and that her oxygen saturations is 98%. Patient reports yesterday she had a normal day and went out for lunch with friends. Patient denies feeling SOB and denies any sense of heart racing. She expresses concern that her portable oxygen tank is not working correctly. She declines offer for RNCM to call [employer] and states she will call them to recheck the system.  Goals Addressed             This Visit's Progress    Assist with management of Health Conditions       Interventions Today    Flowsheet Row Most Recent Value  Chronic Disease   Chronic disease during today's visit Other, Chronic Kidney Disease/End Stage Renal Disease (ESRD)  [ILD]  General Interventions   General Interventions Discussed/Reviewed General Interventions Reviewed  [encouraged patient to see provider. assisted with conferance call to schedule appointment. Patient spoke with triage nurse and appointment scheduled for tomorrow with provider.]  Education Interventions   Education Provided Provided Education  Provided Verbal Education On When to see the doctor, Other  [advised to contact provider or seek medical attention if condition worsens. advised to check BP if she has BP monitor, discussed hyrdration.]  Pharmacy Interventions   Pharmacy Dicussed/Reviewed Pharmacy Topics Reviewed  [medications reviewed]  Safety Interventions   Safety  Discussed/Reviewed Safety Discussed            SDOH assessments and interventions completed:  No  Care Coordination Interventions:  Yes, provided   Follow up plan: Follow up call scheduled for 01/25/24 as previously scheduled    Encounter Outcome:  Patient Visit Completed   Kathyrn Sheriff, RN, MSN, BSN, CCM [place]  [facility], Population Health Case Manager Phone: [phone]

## 2024-01-21 NOTE — Telephone Encounter (Signed)
  Chief Complaint: fatigue Symptoms: fatigue  Frequency: couple days Pertinent Negatives: Patient denies sob Disposition: [] ED /[] Urgent Care (no appt availability in office) / [x] Appointment(In office/virtual)/ []  [place] [facility]/ [] Home Care/ [] Refused Recommended Disposition /[] [place] [facility]/ []  Follow-up with PCP Additional Notes: Pt states that she is feeling fatigue and just doesn't feel well. States she was out yesterday with her portable O2 and doesn't know if it was actually on.  States that her Pulse ox is reading 98% on her 4L today. States it may be dehydration but she is unsure.   Copied from CRM [id number]. Topic: Clinical - Red Word Triage >> Jan 21, 2024  1:13 PM [place] wrote: Red Word that prompted transfer to Nurse Triage: Harvin Hazel Case manager -Patient has a staggered walk, may be dehydrated, does not feel well. feel as she Did not get enough oxygen while she was out yesterday today it is 88. Reason for Disposition  [1] MODERATE weakness (i.e., interferes with work, school, normal activities) AND [2] persists > 3 days  Answer Assessment - Initial Assessment Questions 1. DESCRIPTION: "Describe how you are feeling."     Don't feel like doing anything 2. SEVERITY: "How bad is it?"  "Can you stand and walk?"   - MILD (0-3): Feels weak or tired, but does not interfere with work, school or normal activities.   - MODERATE (4-7): Able to stand and walk; weakness interferes with work, school, or normal activities.   - SEVERE (8-10): Unable to stand or walk; unable to do usual activities.     moderate 3. ONSET: "When did these symptoms begin?" (e.g., hours, days, weeks, months)     yesterday 4. CAUSE: "What do you think is causing the weakness or fatigue?" (e.g., not drinking enough fluids, medical problem, trouble sleeping)     Possible dehydrated 5. NEW MEDICINES:  "Have you started on any new medicines recently?" (e.g., opioid pain medicines,  benzodiazepines, muscle relaxants, antidepressants, antihistamines, neuroleptics, beta blockers)     none 6. OTHER SYMPTOMS: "Do you have any other symptoms?" (e.g., chest pain, fever, cough, SOB, vomiting, diarrhea, bleeding, other areas of pain)     no  Protocols used: Weakness (Generalized) and Fatigue-A-AH

## 2024-01-22 ENCOUNTER — Ambulatory Visit (INDEPENDENT_AMBULATORY_CARE_PROVIDER_SITE_OTHER): Admitting: Nurse Practitioner

## 2024-01-22 ENCOUNTER — Other Ambulatory Visit

## 2024-01-22 VITALS — BP 136/60 | HR 95 | Temp 99.1°F | Ht 63.0 in | Wt 99.4 lb

## 2024-01-22 DIAGNOSIS — J101 Influenza due to other identified influenza virus with other respiratory manifestations: Secondary | ICD-10-CM | POA: Diagnosis not present

## 2024-01-22 LAB — POCT RESPIRATORY SYNCYTIAL VIRUS: RSV Rapid Ag: NEGATIVE

## 2024-01-22 LAB — POCT INFLUENZA A/B
Influenza A, POC: POSITIVE — AB
Influenza B, POC: NEGATIVE

## 2024-01-22 LAB — POC COVID19 BINAXNOW: SARS Coronavirus 2 Ag: NEGATIVE

## 2024-01-22 MED ORDER — OSELTAMIVIR PHOSPHATE 30 MG PO CAPS: 30.0000 mg | ORAL_CAPSULE | Freq: Every day | ORAL | 0 refills | Status: DC

## 2024-01-22 NOTE — Assessment & Plan Note (Addendum)
 Acute Point-of-care testing identifies flu a positive.  She is negative for flu B, COVID, RSV. Onset not clear but it appears symptoms worsened dramatically yesterday so it is reasonable that onset has been in the last 48 hours, will treat with course of Tamiflu.  Based on creatinine clearance we will recommend Tamiflu 30 mg once a day. She is asking for me to reach out to her husband's PCP to see if he can prescribe prophylactic Tamiflu.  I will reach out to him. She was told that if symptoms worsen in any way at home including shortness of breath, fever, confusion, fatigue then she should call 911 and proceed to the emergency department.  She reports her understanding.

## 2024-01-22 NOTE — Progress Notes (Signed)
 Established Patient Office Visit  Subjective   Patient ID: Paula Ward, female    DOB: 02-02-1936  Age: 88 y.o. MRN: [id number]  Chief Complaint  Patient presents with   Fatigue    Fatigue: Patient arrives today with complaints of fatigue.  She reports has been having fatigue for the last few months but yesterday it seemed much worse.  She reports having a hard time getting out of bed.  She does have chronic lung disease and reports that she continues to have shortness of breath but does not seem any worse than her baseline.  Per chart review she has chronic pericardial effusion but but so far she is being managed medically without draining effusion.  She denies any increase in cough or sputum production.  She does report some discomfort in her lower left chest upper left abdomen yesterday, but after a bowel movement this has resolved.  No nausea or vomiting.  She does also have history of pulmonary embolism and anemia.  She is monitored by hematology for her anemia.    Review of Systems  Constitutional:  Positive for malaise/fatigue. Negative for chills and fever.  Respiratory:  Positive for shortness of breath. Negative for cough.   Cardiovascular:  Negative for chest pain and palpitations.  Gastrointestinal:  Negative for abdominal pain, blood in stool, constipation, diarrhea and melena.      Objective:     BP 136/60   Pulse 95   Temp 99.1 F (37.3 C) (Temporal)   Ht 5\' 3"  (1.6 m)   Wt 99 lb 6 oz (45.1 kg)   SpO2 93%   BMI 17.60 kg/m  BP Readings from Last 3 Encounters:  01/22/24 136/60  12/28/23 (!) 102/48  12/14/23 (!) 122/58   Wt Readings from Last 3 Encounters:  01/22/24 99 lb 6 oz (45.1 kg)  12/28/23 101 lb (45.8 kg)  12/14/23 97 lb 12.8 oz (44.4 kg)      Physical Exam Vitals reviewed.  Constitutional:      General: She is not in acute distress.    Appearance: Normal appearance.  HENT:     Head: Normocephalic and atraumatic.  Cardiovascular:      Rate and Rhythm: Normal rate and regular rhythm.     Pulses: Normal pulses.     Heart sounds: Murmur heard.  Pulmonary:     Effort: Pulmonary effort is normal.     Breath sounds: Normal breath sounds. Decreased air movement present.     Comments: Crackles to lower lung fields Skin:    General: Skin is warm and dry.  Neurological:     General: No focal deficit present.     Mental Status: She is alert and oriented to person, place, and time.  Psychiatric:        Mood and Affect: Mood normal.        Behavior: Behavior normal.        Judgment: Judgment normal.      Results for orders placed or performed in visit on 01/22/24  POCT Influenza A/B  Result Value Ref Range   Influenza A, POC Positive (A) Negative   Influenza B, POC Negative Negative  POC COVID-19  Result Value Ref Range   SARS Coronavirus 2 Ag Negative Negative  POCT respiratory syncytial virus  Result Value Ref Range   RSV Rapid Ag negative       The ASCVD Risk score (Arnett DK, et al., 2019) failed to calculate for the following reasons:   The  2019 ASCVD risk score is only valid for ages 35 to 28    Assessment & Plan:   Problem List Items Addressed This Visit       Respiratory   Influenza A - Primary   Acute Point-of-care testing identifies flu a positive.  She is negative for flu B, COVID, RSV. Onset not clear but it appears symptoms worsened dramatically yesterday so it is reasonable that onset has been in the last 48 hours, will treat with course of Tamiflu.  Based on creatinine clearance we will recommend Tamiflu 30 mg once a day. She is asking for me to reach out to her husband's PCP to see if he can prescribe prophylactic Tamiflu.  I will reach out to him. She was told that if symptoms worsen in any way at home including shortness of breath, fever, confusion, fatigue then she should call 911 and proceed to the emergency department.  She reports her understanding.      Relevant Medications    oseltamivir (TAMIFLU) 30 MG capsule   Other Relevant Orders   POCT Influenza A/B (Completed)   POC COVID-19 (Completed)   POCT respiratory syncytial virus (Completed)    Return if symptoms worsen or fail to improve.    Elenore Paddy, NP

## 2024-01-25 ENCOUNTER — Ambulatory Visit: Payer: Self-pay

## 2024-01-25 ENCOUNTER — Other Ambulatory Visit (HOSPITAL_BASED_OUTPATIENT_CLINIC_OR_DEPARTMENT_OTHER): Payer: Self-pay | Admitting: Ophthalmology

## 2024-01-25 ENCOUNTER — Other Ambulatory Visit: Payer: Self-pay

## 2024-01-25 DIAGNOSIS — H539 Unspecified visual disturbance: Secondary | ICD-10-CM

## 2024-01-25 NOTE — Patient Instructions (Signed)
 Visit Information  Thank you for taking time to visit with me today. Please don't hesitate to contact me if I can be of assistance to you before our next scheduled appointment.  Your next care management appointment is by telephone on 02/01/24 at 2:30 pm  Please call the care guide team at [phone] if you need to cancel, schedule, or reschedule an appointment.   Please call the Suicide and Crisis Lifeline: 988 call the [place] National Suicide Prevention Lifeline: 725-801-6733 or TTY: 760-481-9218 TTY (770) 541-4314) to talk to a trained counselor if you are experiencing a Mental Health or Behavioral Health Crisis or need someone to talk to.  Kathyrn Sheriff, RN, MSN, BSN, CCM [place]  [facility], Population Health Case Manager Phone: [phone]

## 2024-01-25 NOTE — Patient Outreach (Signed)
 Complex Care Management   Visit Note  01/25/2024  Name:  Paula Ward MRN: [id number] DOB: Apr 10, 1936  Situation: Referral received for Complex Care Management related to COPD I obtained verbal consent from patient.  Visit completed with patient  on the phone  Background:   Past Medical History:  Diagnosis Date   Anemia    Angiodysplasia of stomach and duodenum    Aortic stenosis    Arthus phenomenon    AVM (arteriovenous malformation)    Blood transfusion without reported diagnosis    Breast cancer (HCC) 1989   Left   Candida esophagitis (HCC)    Cataract    Chronic respiratory failure (HCC)    CKD stage 3b, GFR 30-44 ml/min (HCC)    Corneal edema    Corneal epithelial basement membrane dystrophy    CREST syndrome (HCC)    GERD (gastroesophageal reflux disease)    w/ HH   Interstitial lung disease (HCC)    Nodule of kidney    Pericardial effusion    PONV (postoperative nausea and vomiting)    Pulmonary embolus (HCC) 2003   Pulmonary hypertension (HCC)    Rectal incontinence    Renal cell carcinoma (HCC)    Scleroderma (HCC)    Tubular adenoma of colon    Uterine polyp     Assessment: Paula Ward reports follow up with PCP last week and was diagnosed with the Flu and treated with Tamiflu. She reports she is feeling better. Patient states she does not have time to complete assessment today.  Patient Reported Symptoms: Cognitive Alert and oriented to person, place, and time  Neurological Not assessed    HEENT Not assessed    Cardiovascular Not assessed    Respiratory Not assesed    Endocrine Not assessed    Gastrointestinal Not assessed    Genitourinary Not assessed    Integumentary Not assessed    Musculoskeletal Not assessed    Psychosocial Not assessed     There were no vitals filed for this visit.  Medications Reviewed Today     Reviewed by Colletta Maryland, RN (Registered Nurse) on 01/25/24 at 1505  Med List Status: <None>   Medication Order  Taking? Sig Documenting Provider Last Dose Status Informant  acetaminophen (TYLENOL) 325 MG tablet [id number] Yes Take 2 tablets (650 mg total) by mouth every 6 (six) hours as needed for moderate pain. Wallis Bamberg, PA-C Taking Active Self           Med Note Bonna Gains I   URK Aug 29, 2023 10:03 PM)    albuterol (PROVENTIL) (2.5 MG/3ML) 0.083% nebulizer solution [id number] Yes Take 3 mLs (2.5 mg total) by nebulization every 6 (six) hours as needed for wheezing or shortness of breath. Pokhrel, Laxman, MD Taking Active   albuterol (VENTOLIN HFA) 108 (90 Base) MCG/ACT inhaler [id number] Yes INHALE 2 PUFFS INTO THE LUNGS EVERY 6 HOURS AS NEEDED FOR WHEEZING OR SHORTNESS OF BREATH  Patient taking differently: Inhale 2 puffs into the lungs every 6 (six) hours as needed for wheezing or shortness of breath.   [name], MD Taking Active Self  arformoterol [facility]) 15 MCG/2ML NEBU [id number] Yes Take 2 mLs (15 mcg total) by nebulization 2 (two) times daily. Hunsucker, Lesia Sago, MD Taking Active   augmented betamethasone dipropionate (DIPROLENE-AF) 0.05 % cream [id number] No Apply 1 application  topically 2 (two) times daily as needed (for irritation).  Patient not taking: Reported on 01/25/2024   [provider] Not Taking  Active Self           Med Note Bonna Gains I   Sat Aug 29, 2023  9:58 PM)    clobetasol ointment (TEMOVATE) 0.05 % [id number] No Apply 1 Application topically 2 (two) times daily as needed (for irritation).  Patient not taking: Reported on 01/25/2024   [provider] Not Taking Active Self           Med Note Bonna Gains I   Sat Aug 29, 2023  9:59 PM)    diltiazem (CARDIZEM) 30 MG tablet [id number] Yes Take 1 tablet (30 mg total) by mouth every 12 (twelve) hours. Ronney Asters, NP Taking Active   docusate sodium (COLACE) 100 MG capsule [id number] Yes Take 1 capsule (100 mg total) by mouth 2 (two) times daily.  Patient taking  differently: Take 100 mg by mouth in the morning.   Edmisten, Lyn Hollingshead, PA Taking Active Self  empagliflozin (JARDIANCE) 10 MG TABS tablet [id number] No Take 1 tablet (10 mg total) by mouth daily.  Patient not taking: Reported on 01/25/2024   Lewie Chamber, MD Not Taking Active Self  famotidine (PEPCID) 20 MG tablet [id number] Yes Take 1 tablet (20 mg total) by mouth at bedtime. Napoleon Form, MD Taking Active Self           Med Note Ventura Bruns Aug 13, 2023 10:13 PM)    fluticasone [facility]) 50 MCG/ACT nasal spray [id number] Yes USE 1 SPRAY(S) IN EACH NOSTRIL ONCE DAILY AS NEEDED FOR ALLERGIES [name], MD Taking Active   furosemide (LASIX) 40 MG tablet [id number] Yes Take 1 tablet (40 mg total) by mouth daily. Ronney Asters, NP Taking Active   guaiFENesin (MUCINEX) 600 MG 12 hr tablet [id number] No Take 600 mg by mouth 2 (two) times daily as needed.  Patient not taking: Reported on 01/25/2024   [provider] Not Taking Active Self           Med Note Michaela Corner   Fri Sep 04, 2023  3:42 PM) 09/04/23: Reports during [facility] call she takes one half pill QD  ipratropium (ATROVENT) 0.03 % nasal spray [id number] Yes Place 2 sprays into both nostrils every 12 (twelve) hours.  Patient taking differently: Place 2 sprays into both nostrils at bedtime.   Martina Sinner, MD Taking Active Self  latanoprost (XALATAN) 0.005 % ophthalmic solution [id number] Yes Place 1 drop into both eyes at bedtime. [provider] Taking Active Self  magic mouthwash SOLN [id number] Yes Take 10 mLs by mouth 4 (four) times daily as needed for mouth pain. Burnadette Pop, MD Taking Active Self           Med Note Bonna Gains I   EGB Aug 29, 2023 10:00 PM)    mupirocin ointment (BACTROBAN) 2 % [id number] Yes Apply 1 Application topically 2 (two) times daily as needed (for irritation). [provider] Taking Active Self           Med Note Michaela Corner   Fri Sep 04, 2023  3:44 PM) 09/04/23: Reports during [facility] call has and is taking only if needed   Nutritional Supplements (ENSURE HIGH PROTEIN) LIQD [id number] Yes Take 0.5 Bottles by mouth daily as needed (for supplementation). [provider] Taking Active Self  oseltamivir (TAMIFLU) 30 MG capsule [id number] Yes Take 1 capsule (30 mg total) by mouth daily. Elenore Paddy, NP Taking Active   OXYGEN [id number] Yes Inhale  2-3 L/min into the lungs continuous. [provider] Taking Active Self           Med Note Earlene Plater, [place]   Wed Dec 02, 2023  2:01 PM) Reports 3L/[place], but with 4L with exertion  pantoprazole (PROTONIX) 40 MG tablet [id number] Yes TAKE 1 TABLET BY MOUTH TWICE DAILY BEFORE A MEAL Nandigam, Kavitha V, MD Taking Active   polyethylene glycol (MIRALAX / GLYCOLAX) 17 g packet [id number] Yes Take 17 g by mouth daily. Derenda Fennel, [place] Taking Active Self           Med Note Michaela Corner   Fri Sep 04, 2023  3:46 PM) 09/04/23: Reports during [facility] call has not needed recently- takes prn   revefenacin (YUPELRI) 175 MCG/3ML nebulizer solution [id number] No Take 3 mLs (175 mcg total) by nebulization daily.  Patient not taking: Reported on 01/25/2024   Hunsucker, Lesia Sago, MD Not Taking Active   spironolactone (ALDACTONE) 25 MG tablet [id number] Yes Take 1 tablet (25 mg total) by mouth daily. Ronney Asters, NP Taking Active   sucralfate (CARAFATE) 1 GM/10ML suspension [id number] Yes TAKE  10 ML BY MOUTH EVERY 6 HOURS AS NEEDED FOR REFLUX Nandigam, Eleonore Chiquito, MD Taking Active   triamcinolone cream (KENALOG) 0.1 % [id number] Yes Apply 1 Application topically See admin instructions. Apply to affected leg(s) after showers and up to 2 additional times a day as needed for irritation [provider] Taking Active Self           Med Note Bonna Gains I   Sat Aug 29, 2023 10:03 PM)            Recommendation:   No recommendations at this time  Follow Up Plan:   RNCM  will follow up next week to complete assessment per patient request.  Kathyrn Sheriff, RN, MSN, BSN, CCM [place]  [facility], Population Health Case Manager Phone: [phone]

## 2024-01-27 ENCOUNTER — Inpatient Hospital Stay: Payer: Medicare PPO

## 2024-01-27 ENCOUNTER — Inpatient Hospital Stay: Payer: Medicare PPO | Attending: Physician Assistant

## 2024-01-27 ENCOUNTER — Other Ambulatory Visit: Payer: Self-pay | Admitting: Hematology and Oncology

## 2024-01-27 DIAGNOSIS — D539 Nutritional anemia, unspecified: Secondary | ICD-10-CM | POA: Insufficient documentation

## 2024-01-27 DIAGNOSIS — E538 Deficiency of other specified B group vitamins: Secondary | ICD-10-CM

## 2024-01-27 LAB — CMP (CANCER CENTER ONLY)
ALT: 11 U/L (ref 0–44)
AST: 25 U/L (ref 15–41)
Albumin: 4.3 g/dL (ref 3.5–5.0)
Alkaline Phosphatase: 56 U/L (ref 38–126)
Anion gap: 6 (ref 5–15)
BUN: 52 mg/dL — ABNORMAL HIGH (ref 8–23)
CO2: 38 mmol/L — ABNORMAL HIGH (ref 22–32)
Calcium: 9.6 mg/dL (ref 8.9–10.3)
Chloride: 94 mmol/L — ABNORMAL LOW (ref 98–111)
Creatinine: 1.45 mg/dL — ABNORMAL HIGH (ref 0.44–1.00)
GFR, Estimated: 35 mL/min — ABNORMAL LOW (ref >60)
Glucose, Bld: 110 mg/dL — ABNORMAL HIGH (ref 70–99)
Potassium: 4.6 mmol/L (ref 3.5–5.1)
Sodium: 138 mmol/L (ref 135–145)
Total Bilirubin: 0.6 mg/dL (ref 0.0–1.2)
Total Protein: 7 g/dL (ref 6.5–8.1)

## 2024-01-27 LAB — RETIC PANEL
Immature Retic Fract: 8.4 % (ref 2.3–15.9)
RBC.: 2.72 MIL/uL — ABNORMAL LOW (ref 3.87–5.11)
Retic Count, Absolute: 37.5 10*3/uL (ref 19.0–186.0)
Retic Ct Pct: 1.4 % (ref 0.4–3.1)
Reticulocyte Hemoglobin: 36.8 pg (ref >27.9)

## 2024-01-27 LAB — CBC WITH DIFFERENTIAL (CANCER CENTER ONLY)
Abs Immature Granulocytes: 0.02 10*3/uL (ref 0.00–0.07)
Basophils Absolute: 0.1 10*3/uL (ref 0.0–0.1)
Basophils Relative: 1 %
Eosinophils Absolute: 0.2 10*3/uL (ref 0.0–0.5)
Eosinophils Relative: 3 %
HCT: 30.5 % — ABNORMAL LOW (ref 36.0–46.0)
Hemoglobin: 9.7 g/dL — ABNORMAL LOW (ref 12.0–15.0)
Immature Granulocytes: 0 %
Lymphocytes Relative: 14 %
Lymphs Abs: 0.9 10*3/uL (ref 0.7–4.0)
MCH: 35.7 pg — ABNORMAL HIGH (ref 26.0–34.0)
MCHC: 31.8 g/dL (ref 30.0–36.0)
MCV: 112.1 fL — ABNORMAL HIGH (ref 80.0–100.0)
Monocytes Absolute: 0.4 10*3/uL (ref 0.1–1.0)
Monocytes Relative: 7 %
Neutro Abs: 4.4 10*3/uL (ref 1.7–7.7)
Neutrophils Relative %: 75 %
Platelet Count: 145 10*3/uL — ABNORMAL LOW (ref 150–400)
RBC: 2.72 MIL/uL — ABNORMAL LOW (ref 3.87–5.11)
RDW: 12.2 % (ref 11.5–15.5)
WBC Count: 5.9 10*3/uL (ref 4.0–10.5)
nRBC: 0 % (ref 0.0–0.2)

## 2024-01-27 LAB — VITAMIN B12: Vitamin B-12: 586 pg/mL (ref 180–914)

## 2024-01-27 MED ORDER — CYANOCOBALAMIN 1000 MCG/ML IJ SOLN
1000.0000 ug | Freq: Once | INTRAMUSCULAR | Status: AC
  Administered 2024-01-27: 1000 ug via INTRAMUSCULAR
  Filled 2024-01-27: qty 1

## 2024-01-29 ENCOUNTER — Ambulatory Visit (HOSPITAL_COMMUNITY)
Admission: RE | Admit: 2024-01-29 | Discharge: 2024-01-29 | Disposition: A | Discharge status: Home / Self Care | Source: Ambulatory Visit | Attending: Cardiology | Admitting: Cardiology

## 2024-01-29 ENCOUNTER — Other Ambulatory Visit

## 2024-01-29 DIAGNOSIS — H539 Unspecified visual disturbance: Secondary | ICD-10-CM | POA: Insufficient documentation

## 2024-01-30 LAB — METHYLMALONIC ACID, SERUM: Methylmalonic Acid, Quantitative: 668 nmol/L — ABNORMAL HIGH (ref 0–378)

## 2024-01-31 DIAGNOSIS — I509 Heart failure, unspecified: Secondary | ICD-10-CM | POA: Diagnosis not present

## 2024-02-01 ENCOUNTER — Other Ambulatory Visit

## 2024-02-01 DIAGNOSIS — Z1231 Encounter for screening mammogram for malignant neoplasm of breast: Secondary | ICD-10-CM | POA: Diagnosis not present

## 2024-02-01 DIAGNOSIS — N1832 Chronic kidney disease, stage 3b: Secondary | ICD-10-CM | POA: Diagnosis not present

## 2024-02-01 LAB — HM MAMMOGRAPHY

## 2024-02-03 DIAGNOSIS — I509 Heart failure, unspecified: Secondary | ICD-10-CM | POA: Diagnosis not present

## 2024-02-05 ENCOUNTER — Other Ambulatory Visit: Payer: Self-pay

## 2024-02-05 NOTE — Patient Outreach (Signed)
 Complex Care Management   Visit Note  02/05/2024  Name:  Paula Ward MRN: [id number] DOB: 29-May-1936  Situation: Referral received for Complex Care Management related to  ILD  I obtained verbal consent from Patient.  Visit completed with patient  on the phone  Background:   Past Medical History:  Diagnosis Date   Anemia    Angiodysplasia of stomach and duodenum    Aortic stenosis    Arthus phenomenon    AVM (arteriovenous malformation)    Blood transfusion without reported diagnosis    Breast cancer (HCC) 1989   Left   Candida esophagitis (HCC)    Cataract    Chronic respiratory failure (HCC)    CKD stage 3b, GFR 30-44 ml/min (HCC)    Corneal edema    Corneal epithelial basement membrane dystrophy    CREST syndrome (HCC)    GERD (gastroesophageal reflux disease)    w/ HH   Interstitial lung disease (HCC)    Nodule of kidney    Pericardial effusion    PONV (postoperative nausea and vomiting)    Pulmonary embolus (HCC) 2003   Pulmonary hypertension (HCC)    Rectal incontinence    Renal cell carcinoma (HCC)    Scleroderma (HCC)    Tubular adenoma of colon    Uterine polyp    Assessment: Paula Ward reports she is at baseline. She continues to wear O2 at 3-4 liters. She denies any questions or concerns at this time. Patient Reported Symptoms:  Cognitive Cognitive Status: Alert and oriented to person, place, and time   Health Maintenance Behaviors: Annual physical exam, Stress management, Sleep adequate, Spiritual practice(s) Healing Pattern: Fast Health Facilitated by: Prayer/meditation  Neurological Neurological Review of Symptoms: No symptoms reported    HEENT HEENT Symptoms Reported: No symptoms reported      Cardiovascular Cardiovascular Symptoms Reported: No symptoms reported Does patient have uncontrolled Hypertension?: Yes Is patient checking Blood Pressure at home?: Yes Patient's Recent BP reading at home: 117/57 on 02/04/24 10:00 pm Cardiovascular  Conditions: Hypertension, Heart failure Cardiovascular Management Strategies: Medication therapy, Routine screening, Weight management, Fluid modification Do You Have a Working Readable Scale?: Yes Weight: 100 lb 3.2 oz (45.5 kg) (patient home reading 9am 02/05/24) Cardiovascular Self-Management Outcome: 4 (good)  Respiratory Respiratory Symptoms Reported: Shortness of breath Other Respiratory Symptoms: patient reports she is at her baseline. She reports SOB with exertion is her norm. Respiratory Conditions:  (ILD) Respiratory Self-Management Outcome: 5 (very good)  Endocrine Patient reports the following symptoms related to hypoglycemia or hyperglycemia : No symptoms reported    Gastrointestinal Gastrointestinal Symptoms Reported: No symptoms reported      Genitourinary Genitourinary Symptoms Reported: No symptoms reported    Integumentary Integumentary Symptoms Reported: No symptoms reported    Musculoskeletal Musculoskelatal Symptoms Reviewed: No symptoms reported        Psychosocial Psychosocial Symptoms Reported: Anxiety - if selected complete GAD, Depression - if selected complete PHQ 2-9            02/05/2024   10:38 AM  Depression screen PHQ 2/9  Decreased Interest 1  Down, Depressed, Hopeless 0  PHQ - 2 Score 1    Vitals:   02/04/24 2209  BP: (!) 117/57  Pulse: 83    Medications Reviewed Today     Reviewed by Ceasar Decandia M, RN (Registered Nurse) on 02/05/24 at 1015  Med List Status: <None>   Medication Order Taking? Sig Documenting Provider Last Dose Status Informant  acetaminophen  (TYLENOL ) 325 MG  tablet [id number] Yes Take 2 tablets (650 mg total) by mouth every 6 (six) hours as needed for moderate pain. Adolph Hoop, PA-C Taking Active Self           Med Note Frieda Jew I   YNW Aug 29, 2023 10:03 PM)    albuterol  (PROVENTIL ) (2.5 MG/3ML) 0.083% nebulizer solution [id number] Yes Take 3 mLs (2.5 mg total) by nebulization every 6 (six) hours as  needed for wheezing or shortness of breath. Pokhrel, Laxman, MD Taking Active   albuterol  (VENTOLIN  HFA) 108 (90 Base) MCG/ACT inhaler [id number] Yes INHALE 2 PUFFS INTO THE LUNGS EVERY 6 HOURS AS NEEDED FOR WHEEZING OR SHORTNESS OF BREATH  Patient taking differently: Inhale 2 puffs into the lungs every 6 (six) hours as needed for wheezing or shortness of breath.   Adelia Homestead, MD Taking Active Self  arformoterol  (BROVANA ) 15 MCG/2ML NEBU [id number] Yes Take 2 mLs (15 mcg total) by nebulization 2 (two) times daily. Hunsucker, Archer Kobs, MD Taking Active   augmented betamethasone dipropionate (DIPROLENE-AF) 0.05 % cream [id number] No Apply 1 application  topically 2 (two) times daily as needed (for irritation).  Patient not taking: Reported on 01/25/2024   [provider] Not Taking Active Self           Med Note Frieda Jew I   Sat Aug 29, 2023  9:58 PM)    clobetasol ointment (TEMOVATE) 0.05 % [id number] No Apply 1 Application topically 2 (two) times daily as needed (for irritation).  Patient not taking: Reported on 01/25/2024   [provider] Not Taking Active Self           Med Note Frieda Jew I   Sat Aug 29, 2023  9:59 PM)    diltiazem  (CARDIZEM ) 30 MG tablet [id number] Yes Take 1 tablet (30 mg total) by mouth every 12 (twelve) hours. Carie Charity, NP Taking Active   docusate sodium  (COLACE) 100 MG capsule [id number] Yes Take 1 capsule (100 mg total) by mouth 2 (two) times daily.  Patient taking differently: Take 100 mg by mouth in the morning.   Edmisten, Onesimo Bijou, PA Taking Active Self  empagliflozin  (JARDIANCE ) 10 MG TABS tablet [id number] No Take 1 tablet (10 mg total) by mouth daily.  Patient not taking: Reported on 01/25/2024   Faith Homes, MD Not Taking Active Self  famotidine  (PEPCID ) 20 MG tablet [id number] Yes Take 1 tablet (20 mg total) by mouth at bedtime. Nandigam, Kavitha V, MD Taking Active Self           Med Note Randene Bustard Aug 13, 2023 10:13 PM)    fluticasone  (FLONASE ) 50 MCG/ACT nasal spray [id number] Yes USE 1 SPRAY(S) IN EACH NOSTRIL ONCE DAILY AS NEEDED FOR ALLERGIES Adelia Homestead, MD Taking Active   furosemide  (LASIX ) 40 MG tablet [id number] Yes Take 1 tablet (40 mg total) by mouth daily. Carie Charity, NP Taking Active   guaiFENesin  (MUCINEX ) 600 MG 12 hr tablet [id number] No Take 600 mg by mouth 2 (two) times daily as needed.  Patient not taking: Reported on 01/25/2024   [provider] Not Taking Active Self           Med Note (TOUSEY, LAINE M   Fri Sep 04, 2023  3:42 PM) 09/04/23: Reports during [facility] call she takes one half pill QD  ipratropium (ATROVENT ) 0.03 % nasal spray [id number] Yes Place 2 sprays into both nostrils every 12 (twelve) hours.  Patient  taking differently: Place 2 sprays into both nostrils at bedtime.   Wilfredo Hanly, MD Taking Active Self  latanoprost  (XALATAN ) 0.005 % ophthalmic solution [id number] Yes Place 1 drop into both eyes at bedtime. [provider] Taking Active Self  magic mouthwash SOLN [id number] Yes Take 10 mLs by mouth 4 (four) times daily as needed for mouth pain. Leona Rake, MD Taking Active Self           Med Note [place] [facility] I   LKG Aug 29, 2023 10:00 PM)    mupirocin  ointment (BACTROBAN ) 2 % [id number] Yes Apply 1 Application topically 2 (two) times daily as needed (for irritation). [provider] Taking Active Self           Med Note (TOUSEY, LAINE M   Fri Sep 04, 2023  3:44 PM) 09/04/23: Reports during [facility] call has and is taking only if needed   Nutritional Supplements (ENSURE HIGH PROTEIN) LIQD [id number] Yes Take 0.5 Bottles by mouth daily as needed (for supplementation). [provider] Taking Active Self  oseltamivir  (TAMIFLU ) 30 MG capsule [id number] No Take 1 capsule (30 mg total) by mouth daily.  Patient not taking: Reported on 02/05/2024   Zorita Hiss, NP Not Taking Active   OXYGEN  [id number]  Yes Inhale 2-3 L/min into the lungs continuous. [provider] Taking Active Self           Med Note Lajuana Pilar, Jacky Massing   Wed Dec 02, 2023  2:01 PM) Reports 3L/[place], but with 4L with exertion  pantoprazole  (PROTONIX ) 40 MG tablet [id number] Yes TAKE 1 TABLET BY MOUTH TWICE DAILY BEFORE A MEAL Nandigam, Kavitha V, MD Taking Active   polyethylene glycol (MIRALAX  / GLYCOLAX ) 17 g packet [id number] Yes Take 17 g by mouth daily. Dolphus Friday, [place] Taking Active Self           Med Note (TOUSEY, LAINE M   Fri Sep 04, 2023  3:46 PM) 09/04/23: Reports during [facility] call has not needed recently- takes prn   revefenacin  (YUPELRI ) 175 MCG/3ML nebulizer solution [id number] No Take 3 mLs (175 mcg total) by nebulization daily.  Patient not taking: Reported on 02/05/2024   Hunsucker, Archer Kobs, MD Not Taking Active   spironolactone  (ALDACTONE ) 25 MG tablet [id number] Yes Take 1 tablet (25 mg total) by mouth daily. Carie Charity, NP Taking Active   sucralfate  (CARAFATE ) 1 GM/10ML suspension [id number] Yes TAKE  10 ML BY MOUTH EVERY 6 HOURS AS NEEDED FOR REFLUX Nandigam, Kavitha V, MD Taking Active   triamcinolone  cream (KENALOG ) 0.1 % [id number] Yes Apply 1 Application topically See admin instructions. Apply to affected leg(s) after showers and up to 2 additional times a day as needed for irritation [provider] Taking Active Self           Med Note Frieda Jew I   Sat Aug 29, 2023 10:03 PM)            Recommendation:   PCP Follow-up  Follow Up Plan:   Telephone follow up appointment date/time:  03/14/24 at 11:30 am  Lindi Revering, RN, MSN, BSN, CCM [place]  [facility], Population Health Case Manager Phone: [phone]

## 2024-02-05 NOTE — Patient Instructions (Signed)
Visit Information  Thank you for taking time to visit with me today. Please don't hesitate to contact me if I can be of assistance to you before our next scheduled appointment.  Our next appointment is by telephone on 03/14/24 at 11:30 am Please call the care guide team at [phone] if you need to cancel or reschedule your appointment.   Following is a copy of your care plan:   Goals Addressed             This Visit's Progress    COMPLETED: Assist with management of Health Conditions       Interventions Today    Flowsheet Row Most Recent Value  Chronic Disease   Chronic disease during today's visit Other, Chronic Kidney Disease/End Stage Renal Disease (ESRD)  [ILD]  General Interventions   General Interventions Discussed/Reviewed General Interventions Reviewed  [encouraged patient to see provider. assisted with conferance call to schedule appointment. Patient spoke with triage nurse and appointment scheduled for tomorrow with provider.]  Education Interventions   Education Provided Provided Education  Provided Verbal Education On When to see the doctor, Other  [advised to contact provider or seek medical attention if condition worsens. advised to check BP if she has BP monitor, discussed hyrdration.]  Pharmacy Interventions   Pharmacy Dicussed/Reviewed Pharmacy Topics Reviewed  [medications reviewed]  Safety Interventions   Safety Discussed/Reviewed Safety Discussed           VBCI RN Care Plan       Problems:  Interstitial Lung Disease  Goal: Over the next 90 days the Patient will attend all scheduled medical appointments: pulmonology, oncology, clinical pharmacy as evidenced by patient report or review of chart        continue to work with RN Care Manager and/or Social Worker to address care management and care coordination needs related to ILD as evidenced by adherence to care management team scheduled appointments     demonstrate Ongoing adherence to prescribed treatment  plan for ILD as evidenced by patient report or review of chart take all medications exactly as prescribed and will call provider for medication related questions as evidenced by patient report or review of chart     Interventions:   Evaluation of current treatment plan related to  ILD ,     self-management and patient's adherence to plan as established by provider. Discussed plans with patient for ongoing care management follow up and provided patient with direct contact information for care management team Advised patient to provide copy of advance directive at next pcp visit  Patient Self-Care Activities:  Attend all scheduled provider appointments Call provider office for new concerns or questions  Perform all self care activities independently  Take medications as prescribed    Plan:  Telephone follow up appointment with care management team member scheduled for:  03/14/24 at 11:30 am           Please call the Suicide and Crisis Lifeline: 988 call the [place]  National Suicide Prevention Lifeline: (726)118-6557 or TTY: 763-829-2833 TTY 657-581-5214) to talk to a trained counselor if you are experiencing a Mental Health or Behavioral Health Crisis or need someone to talk to.  Patient verbalizes understanding of instructions and care plan provided today and agrees to view in MyChart. Active MyChart status and patient understanding of how to access instructions and care plan via MyChart confirmed with patient.     Lindi Revering, RN, MSN, BSN, CCM [place]  [facility], Population Health Case Manager Phone:  [phone]  

## 2024-02-10 DIAGNOSIS — N1832 Chronic kidney disease, stage 3b: Secondary | ICD-10-CM | POA: Diagnosis not present

## 2024-02-10 DIAGNOSIS — Z85528 Personal history of other malignant neoplasm of kidney: Secondary | ICD-10-CM | POA: Diagnosis not present

## 2024-02-10 DIAGNOSIS — D638 Anemia in other chronic diseases classified elsewhere: Secondary | ICD-10-CM | POA: Diagnosis not present

## 2024-02-10 DIAGNOSIS — I10 Essential (primary) hypertension: Secondary | ICD-10-CM | POA: Diagnosis not present

## 2024-02-10 DIAGNOSIS — I129 Hypertensive chronic kidney disease with stage 1 through stage 4 chronic kidney disease, or unspecified chronic kidney disease: Secondary | ICD-10-CM | POA: Diagnosis not present

## 2024-02-10 DIAGNOSIS — M349 Systemic sclerosis, unspecified: Secondary | ICD-10-CM | POA: Diagnosis not present

## 2024-02-10 DIAGNOSIS — I272 Pulmonary hypertension, unspecified: Secondary | ICD-10-CM | POA: Diagnosis not present

## 2024-02-10 DIAGNOSIS — E875 Hyperkalemia: Secondary | ICD-10-CM | POA: Diagnosis not present

## 2024-02-10 DIAGNOSIS — R809 Proteinuria, unspecified: Secondary | ICD-10-CM | POA: Diagnosis not present

## 2024-02-15 ENCOUNTER — Encounter: Payer: Self-pay | Admitting: Pulmonary Disease

## 2024-02-15 ENCOUNTER — Other Ambulatory Visit: Payer: Self-pay | Admitting: General Practice

## 2024-02-15 ENCOUNTER — Ambulatory Visit: Payer: Medicare PPO | Admitting: Pulmonary Disease

## 2024-02-15 VITALS — BP 116/62 | HR 60 | Ht 63.0 in | Wt 103.6 lb

## 2024-02-15 DIAGNOSIS — R0609 Other forms of dyspnea: Secondary | ICD-10-CM

## 2024-02-15 NOTE — Patient Instructions (Signed)
 Nice to see you again  Keep checking your oxygen , keep it above 90%, if it is going below that with exertion let me know we will need to turn up the oxygen  flow rate  Sound like the oxygen  numbers are doing okay I fear that the symptoms you are having are related to your stiff valves causing not enough forward flow of blood to your brain  You need to avoid the activities that cause the symptoms  I will send a message to your cardiology team  Return to clinic in 3 months or sooner as needed with Dr. Marygrace Snellen

## 2024-02-15 NOTE — Progress Notes (Signed)
 @Patient  ID: Paula Ward, female    DOB: 1936/09/18, 88 y.o.   MRN: [id number]  Chief Complaint  Patient presents with   Follow-up    Breathing is about the same. She states that she has to sleep sitting up sometimes bc this helps her breathing.     Referring provider: Adelia Homestead, *  HPI:   88 y.o. 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   Most recent cardiology note reviewed.    Returns for routine follow-up. She remains quite dyspneic.  We discussed that there are multiple etiologies for this noted to have a good fix for her.  Suspect of pulmonary hypertension, heart failure, and ILD will progress over time leading to worsening symptoms.  Insurance denied Yupelri .  Arformoterol  still helps some.  She reported presyncope.  Or black spots in her eyes.  Saw the eye doctor with no findings.  Sounds like presyncope.  Likely valvular stenosis and poor cardiac output leading to decreased blood flow to the brain.  We discussed in detail.  She states her oxygen  is  always 90% or greater, usually at 100%.  Only occurs when carrying something and ambulating like groceries.  Recommended she avoid activities that cause the symptoms.  HPI at initial visit: Patient here for evaluation of pulmonary hypertension.  Multiple hospitalizations for decompensated CHF left-sided disease, pulmonary venous hypertension.  As evidenced by serial images with pleural effusions and pulmonary edema.  Her echocardiogram over time is showing a severely dilated left atrium with valvular dysfunction as well as diastolic dysfunction.  Fortunately her RV functional serial echocardiograms look fine.  Her most recent right heart catheterization LVEDP of 18, PVR of 2 Wood units.  Her prior right heart catheterization at [facility] years prior with a PVR less than 2.  Both  times on Opsumit .  Her initial cath 2001 with PVR 5.  We discussed at length the etiology of her pulmonary hypertension.  Has evolved over time.  The changing physiology of her pulmonary hypertension as a relates to her left-sided disease and valvular disease and likely worsening on pulmonary vasodilators.  She expressed understanding.  She is feeling well since discharge.  Feels better off the Opsumit .  Discussed how VQ mismatch could worsen on Opsumit  given her interstitial lung disease.  Overall her hypoxemia seems improved.  She walked in the clinic today and immediate after sitting down with send saturation was 94%.  We discussed ongoing oxygen  use with exertion that she is to monitor at home as discussed below.   Questionaires / Pulmonary Flowsheets:   ACT:      No data to display          MMRC:     No data to display          Epworth:      No data to display          Tests:   FENO:  No results found for: "NITRICOXIDE"  PFT:    Latest Ref Rng & Units 08/25/2023    2:45 PM 02/03/2022   11:01 AM  PFT Results  FVC-Pre L 0.96  1.34   FVC-Predicted Pre % 42  56   FVC-Post L 0.91    FVC-Predicted Post % 40    Pre FEV1/FVC % % 79  84   Post FEV1/FCV % % 83    FEV1-Pre L  0.76  1.12   FEV1-Predicted Pre % 45  64   FEV1-Post L 0.76    DLCO uncorrected ml/min/mmHg 7.39  10.32   DLCO UNC% % 41  57   DLCO corrected ml/min/mmHg 8.46  11.49   DLCO COR %Predicted % 47  63   DLVA Predicted % 119  119   TLC L 3.02  2.88   TLC % Predicted % 61  58   RV % Predicted % 86  62   Personally reviewed and interpreted at time spirometry suggestive of severe restriction versus air trapping, no bronchodilator response.  Lung volumes consistent with severe restriction, DLCO severely reduced.  WALK:     07/15/2023    4:27 PM 03/13/2022    3:15 PM 01/06/2022   12:08 PM  SIX MIN WALK  Supplimental Oxygen  during Test? (L/min) No    2 Minute Oxygen  Saturation %  90 % 89 %  2  Minute HR  107 111  4 Minute Oxygen  Saturation %  89 % 93 %  4 Minute HR  111 113  6 Minute Oxygen  Saturation %  88 % 92 %  6 Minute HR  108 118  Tech Comments: patient walked one lap with no complaints, O2 sat 88%, patient aware to use POC 2L w/exertion/activity      Imaging: Personally reviewed and as per EMR and discussion in this note VAS US  CAROTID Result Date: 01/31/2024 Carotid Arterial Duplex Study Patient Name:  MIKHAELA GIORGI  Date of Exam:   01/29/2024 Medical Rec #: [id number]        Accession #:    [id number] Date of Birth: 11/29/1935        Patient Gender: F Patient Age:   88 years Exam Location:  [facility] Procedure:      VAS US  CAROTID Referring Phys: CHRISTINE MCCUEN --------------------------------------------------------------------------------  Indications:   Visual disturbance. Risk Factors:  Hypertension, no history of smoking. Other Factors: CKD, CHF, AS. Performing Technologist: Lyndal Sandy RDMS, RVT, RDCS  Examination Guidelines: A complete evaluation includes B-mode imaging, spectral Doppler, color Doppler, and power Doppler as needed of all accessible portions of each vessel. Bilateral testing is considered an integral part of a complete examination. Limited examinations for reoccurring indications may be performed as noted.  Right Carotid Findings: +----------+--------+--------+--------+-------------------------+--------+           PSV cm/sEDV cm/sStenosisPlaque Description       Comments +----------+--------+--------+--------+-------------------------+--------+ CCA Prox  64      0                                                 +----------+--------+--------+--------+-------------------------+--------+ CCA Distal66      4                                                 +----------+--------+--------+--------+-------------------------+--------+ ICA Prox  72      6       1-39%   calcific and heterogenous          +----------+--------+--------+--------+-------------------------+--------+ ICA Mid   73      9                                                 +----------+--------+--------+--------+-------------------------+--------+  ICA Distal80      27                                                +----------+--------+--------+--------+-------------------------+--------+ ECA       58      0                                                 +----------+--------+--------+--------+-------------------------+--------+ +----------+--------+-------+----------------+-------------------+           PSV cm/sEDV cmsDescribe        Arm Pressure (mmHG) +----------+--------+-------+----------------+-------------------+ [id number]     4      Multiphasic, [id number]                 +----------+--------+-------+----------------+-------------------+ +---------+--------+--+--------+-+---------+ VertebralPSV cm/s75EDV cm/s5Antegrade +---------+--------+--+--------+-+---------+  Left Carotid Findings: +----------+--------+--------+--------+-------------------------+--------+           PSV cm/sEDV cm/sStenosisPlaque Description       Comments +----------+--------+--------+--------+-------------------------+--------+ CCA Prox  71      13                                                +----------+--------+--------+--------+-------------------------+--------+ CCA Distal58      5                                                 +----------+--------+--------+--------+-------------------------+--------+ ICA Prox  112     14      1-39%   calcific and heterogenous         +----------+--------+--------+--------+-------------------------+--------+ ICA Mid   87      5                                                 +----------+--------+--------+--------+-------------------------+--------+ ICA Distal76      11                                                 +----------+--------+--------+--------+-------------------------+--------+ ECA       110     0                                                 +----------+--------+--------+--------+-------------------------+--------+ +----------+--------+--------+----------------+-------------------+           PSV cm/sEDV cm/sDescribe        Arm Pressure (mmHG) +----------+--------+--------+----------------+-------------------+ [id number]     2       Multiphasic, [id number]                 +----------+--------+--------+----------------+-------------------+ +---------+--------+--+--------+--+---------+ VertebralPSV cm/s95EDV cm/s15Antegrade +---------+--------+--+--------+--+---------+   Summary: Right Carotid: Velocities in the right ICA  are consistent with a 1-39% stenosis. Left Carotid: Velocities in the left ICA are consistent with a 1-39% stenosis. Vertebrals:  Bilateral vertebral arteries demonstrate antegrade flow. Subclavians: Normal flow hemodynamics were seen in bilateral subclavian              arteries. *See table(s) above for measurements and observations.  Electronically signed by Antionette Kirks MD on 01/31/2024 at 7:30:17 PM.    Final     Lab Results: Personally reviewed CBC    Component Value Date/Time   WBC 5.9 01/27/2024 1322   WBC 6.0 12/04/2023 1511   RBC 2.72 (L) 01/27/2024 1322   RBC 2.72 (L) 01/27/2024 1322   HGB 9.7 (L) 01/27/2024 1322   HGB 11.2 04/21/2022 0940   HCT 30.5 (L) 01/27/2024 1322   HCT 32.7 (L) 04/21/2022 0940   PLT 145 (L) 01/27/2024 1322   PLT 242 04/21/2022 0940   MCV 112.1 (H) 01/27/2024 1322   MCV 104 (H) 04/21/2022 0940   MCH 35.7 (H) 01/27/2024 1322   MCHC 31.8 01/27/2024 1322   RDW 12.2 01/27/2024 1322   RDW 13.1 04/21/2022 0940   LYMPHSABS 0.9 01/27/2024 1322   MONOABS 0.4 01/27/2024 1322   EOSABS 0.2 01/27/2024 1322   BASOSABS 0.1 01/27/2024 1322    BMET    Component Value Date/Time   NA 138 01/27/2024 1322   NA 145 (H)  10/30/2023 1434   K 4.6 01/27/2024 1322   CL 94 (L) 01/27/2024 1322   CO2 38 (H) 01/27/2024 1322   GLUCOSE 110 (H) 01/27/2024 1322   GLUCOSE 101 (H) 10/02/2006 0845   BUN 52 (H) 01/27/2024 1322   BUN 48 (H) 10/30/2023 1434   CREATININE 1.45 (H) 01/27/2024 1322   CALCIUM  9.6 01/27/2024 1322   GFRNONAA 35 (L) 01/27/2024 1322   GFRAA >60 05/24/2018 0652    BNP    Component Value Date/Time   BNP 1,686.5 (H) 12/04/2023 1511    ProBNP    Component Value Date/Time   PROBNP 2,680.0 (H) 06/03/2023 1200    Specialty Problems       Pulmonary Problems   ILD (interstitial lung disease) (HCC)   Qualifier: Diagnosis of  By: Sharlette Dayhoff CMA, [place]        Allergic rhinitis   Globus pharyngeus   Chronic respiratory failure with hypoxia (HCC)   Influenza A    Allergies  Allergen Reactions   Codeine  Nausea Only    Hallucinations, too   Other Nausea And Vomiting and Other (See Comments)    "-mycin" antibiotics.   Also cause hallucinations.   Erythromycin Nausea And Vomiting   Iodinated Contrast Media Other (See Comments)    Renal issues   Lisinopril     Pt doesn't remember reaction   Mirtazapine  Other (See Comments)    "Threw me into orbit"   Mycophenolate Mofetil Nausea Only   Sulfa  Antibiotics Other (See Comments)    Unknown reaction    Immunization History  Administered Date(s) Administered   Fluad Quad(high Dose 65+) 07/24/2020   Influenza, High Dose Seasonal PF 07/07/2017, 06/21/2019   Influenza-Unspecified 07/15/2013, 02/16/2015, 07/19/2015, 07/18/2016, 08/20/2018, 06/24/2021, 08/03/2022   Moderna Sars-Covid-2 Vaccination 11/01/2019, 11/29/2019, 09/14/2020, 02/16/2021   Pneumococcal Conjugate-13 01/30/2014   Pneumococcal Polysaccharide-23 08/30/2007   Respiratory Syncytial Virus Vaccine ,Recomb Aduvanted(Arexvy ) 09/24/2022   Tdap 06/26/2011, 06/07/2018   Zoster, Live 08/15/2014    Past Medical History:  Diagnosis Date   Anemia    Angiodysplasia of stomach and  duodenum    Aortic stenosis  Arthus phenomenon    AVM (arteriovenous malformation)    Blood transfusion without reported diagnosis    Breast cancer (HCC) 1989   Left   Candida esophagitis (HCC)    Cataract    Chronic respiratory failure (HCC)    CKD stage 3b, GFR 30-44 ml/min (HCC)    Corneal edema    Corneal epithelial basement membrane dystrophy    CREST syndrome (HCC)    GERD (gastroesophageal reflux disease)    w/ HH   Interstitial lung disease (HCC)    Nodule of kidney    Pericardial effusion    PONV (postoperative nausea and vomiting)    Pulmonary embolus (HCC) 2003   Pulmonary hypertension (HCC)    Rectal incontinence    Renal cell carcinoma (HCC)    Scleroderma (HCC)    Tubular adenoma of colon    Uterine polyp     Tobacco History: Social History   Tobacco Use  Smoking Status Never  Smokeless Tobacco Never   Counseling given: Not Answered   Continue to not smoke  Outpatient Encounter Medications as of 02/15/2024  Medication Sig   acetaminophen  (TYLENOL ) 325 MG tablet Take 2 tablets (650 mg total) by mouth every 6 (six) hours as needed for moderate pain.   albuterol  (VENTOLIN  HFA) 108 (90 Base) MCG/ACT inhaler INHALE 2 PUFFS INTO THE LUNGS EVERY 6 HOURS AS NEEDED FOR WHEEZING OR SHORTNESS OF BREATH (Patient taking differently: Inhale 2 puffs into the lungs every 6 (six) hours as needed for wheezing or shortness of breath.)   arformoterol  (BROVANA ) 15 MCG/2ML NEBU Take 2 mLs (15 mcg total) by nebulization 2 (two) times daily.   augmented betamethasone dipropionate (DIPROLENE-AF) 0.05 % cream Apply 1 application  topically 2 (two) times daily as needed (for irritation).   clobetasol ointment (TEMOVATE) 0.05 % Apply 1 Application topically 2 (two) times daily as needed (for irritation).   diltiazem  (CARDIZEM ) 30 MG tablet Take 1 tablet (30 mg total) by mouth every 12 (twelve) hours.   docusate sodium  (COLACE) 100 MG capsule Take 1 capsule (100 mg total) by mouth  2 (two) times daily. (Patient taking differently: Take 100 mg by mouth in the morning.)   empagliflozin  (JARDIANCE ) 10 MG TABS tablet Take 1 tablet (10 mg total) by mouth daily.   famotidine  (PEPCID ) 20 MG tablet Take 1 tablet (20 mg total) by mouth at bedtime.   fluticasone  (FLONASE ) 50 MCG/ACT nasal spray USE 1 SPRAY(S) IN EACH NOSTRIL ONCE DAILY AS NEEDED FOR ALLERGIES   furosemide  (LASIX ) 40 MG tablet Take 1 tablet (40 mg total) by mouth daily.   guaiFENesin  (MUCINEX ) 600 MG 12 hr tablet Take 600 mg by mouth 2 (two) times daily as needed.   ipratropium (ATROVENT ) 0.03 % nasal spray Place 2 sprays into both nostrils every 12 (twelve) hours. (Patient taking differently: Place 2 sprays into both nostrils at bedtime.)   latanoprost  (XALATAN ) 0.005 % ophthalmic solution Place 1 drop into both eyes at bedtime.   magic mouthwash SOLN Take 10 mLs by mouth 4 (four) times daily as needed for mouth pain.   mupirocin  ointment (BACTROBAN ) 2 % Apply 1 Application topically 2 (two) times daily as needed (for irritation).   Nutritional Supplements (ENSURE HIGH PROTEIN) LIQD Take 0.5 Bottles by mouth daily as needed (for supplementation).   oseltamivir  (TAMIFLU ) 30 MG capsule Take 1 capsule (30 mg total) by mouth daily.   OXYGEN  Inhale 2-3 L/min into the lungs continuous.   pantoprazole  (PROTONIX ) 40 MG tablet TAKE 1  TABLET BY MOUTH TWICE DAILY BEFORE A MEAL   polyethylene glycol (MIRALAX  / GLYCOLAX ) 17 g packet Take 17 g by mouth daily.   spironolactone  (ALDACTONE ) 25 MG tablet Take 1 tablet (25 mg total) by mouth daily.   sucralfate  (CARAFATE ) 1 GM/10ML suspension TAKE  10 ML BY MOUTH EVERY 6 HOURS AS NEEDED FOR REFLUX   triamcinolone  cream (KENALOG ) 0.1 % Apply 1 Application topically See admin instructions. Apply to affected leg(s) after showers and up to 2 additional times a day as needed for irritation   albuterol  (PROVENTIL ) (2.5 MG/3ML) 0.083% nebulizer solution Take 3 mLs (2.5 mg total) by nebulization  every 6 (six) hours as needed for wheezing or shortness of breath. (Patient not taking: Reported on 02/15/2024)   revefenacin  (YUPELRI ) 175 MCG/3ML nebulizer solution Take 3 mLs (175 mcg total) by nebulization daily. (Patient not taking: Reported on 02/15/2024)   No facility-administered encounter medications on file as of 02/15/2024.     Review of Systems  Review of Systems  N/a Physical Exam  BP 116/62   Pulse 60   Ht 5\' 3"  (1.6 m)   Wt 103 lb 9.6 oz (47 kg)   SpO2 92%   BMI 18.35 kg/m   Wt Readings from Last 5 Encounters:  02/15/24 103 lb 9.6 oz (47 kg)  02/05/24 100 lb 3.2 oz (45.5 kg)  01/22/24 99 lb 6 oz (45.1 kg)  12/28/23 101 lb (45.8 kg)  12/14/23 97 lb 12.8 oz (44.4 kg)    BMI Readings from Last 5 Encounters:  02/15/24 18.35 kg/m  02/05/24 17.75 kg/m  01/22/24 17.60 kg/m  12/28/23 17.89 kg/m  12/14/23 17.32 kg/m     Physical Exam General: Sitting in chair, frail, elderly Eyes: EOMI, no icterus Neck: Supple, no JVP Pulmonary: Crackles in bilateral bases, otherwise clear, distant, on 3 L pulsed via POC Cardiovascular: Warm, trace edema to above ankles bilaterally Abdomen: Nondistended, bowel sounds present MSK: No synovitis, joint effusion is noted at the MCP joints Neuro: Normal gait, no weakness Psych: Normal mood, full affect patient   Assessment & Plan:   Pulmonary hypertension: Documented on the right heart cath first diagnosed 2001.  Serial markers reviewed.  Multifactorial nature of disease primarily group 2 and 3 disease given elevated wedge pressure and mild ILD, hypoxemia.  At time of initial cath in 2001 PVR was elevated over 5 for group 1 disease likely was primary tumor back then, driven by scleroderma and CTD.  However, has a physiology of her heart has changed over time with diastolic dysfunction, aortic stenosis, mitral valve stenosis, diastolic dysfunction and development of left atrial hypertension, pulmonary venous hypertension, suspect  systemic pulmonary vasodilators has led to exacerbation of left-sided congestive heart failure.  Multiple admissions for CHF with signs of pulmonary edema and pleural effusions again indicating pulmonary venous hypertension.  Her most recent catheterizations have indicated a PVR less than 3, admittedly on ERA, Opsumit .  However given left-sided disease and left-sided lower pulmonary venous hypertension, we stopped her Opsumit .  She is feeling better.  Possibly contributing to worsening VQ mismatch as well in the setting of her ILD.  Frankly, given her frailty and complex and multifactorial disease, additional pulmonary vasodilators are likely not in her best interest.  She remains stable off Opsumit , weight stable, decreasing diuretic doses via her cardiology office.  Further titration of diuretics via cardiology.  Stressed importance of low-salt diet and diuretic therapy.  She expressed understanding.  Fortunately RV function on most recent echocardiogram was  excellent.  Chronic hypoxemic respiratory failure: Likely multifactorial but primarily driven by CT ILD.  Encouraged to check oxygen  to keep in the mid 90s if able especially with exertion to avoid hypoxic vasoconstriction which can worsen acute pulmonary hypertension symptoms are chronically contribute to worsening pulmonary hypertension and RV failure.  On 3 L nasal cannula pulsed via POC today.  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  Largely unchanged over serial images over the last several years.  No progression.  In the past has declined antifibrotic's given side effect profile.  Given her age and frailty this is reasonable.  Presyncope: Suspect related to poor cardiac output as well as bowel dysfunction pulmonary hypertension.  Only occurs with exertion.  Oxygen  saturation reportedly 90+ percent.  Does not sound like the hypoxemia issue.  More forward flow issue.  Will message cardiology colleagues and let them know.  They plan to 1 year  follow-up of echocardiogram to see if it is reasonable to repeat this shorter interval.   Return in about 3 months (around 05/16/2024) for f/u Dr. Marygrace Snellen.   Guerry Leek, MD 02/15/2024   This appointment required 47 minutes of patient care (this includes precharting, chart review, review of results, face-to-face care, etc.).

## 2024-02-16 ENCOUNTER — Other Ambulatory Visit: Payer: Self-pay | Admitting: Gastroenterology

## 2024-02-16 ENCOUNTER — Other Ambulatory Visit: Admitting: Pharmacist

## 2024-02-16 DIAGNOSIS — J9611 Chronic respiratory failure with hypoxia: Secondary | ICD-10-CM

## 2024-02-16 NOTE — Patient Instructions (Signed)
 It was a pleasure speaking with you today!    Feel free to call with any questions or concerns!  Arbutus Leas, PharmD, BCPS, CPP Clinical Pharmacist Practitioner [facility] [phone]

## 2024-02-16 NOTE — Progress Notes (Signed)
   02/16/2024 Name: Paula Ward MRN: [id number] DOB: 02/02/1936  Chief Complaint  Patient presents with   Medication access    Paula Ward is a 88 y.o. year old female who presented for a telephone visit.   They were referred to the pharmacist by their Case Management Team  for assistance in managing medication access for Yupelri .   Care Team: Primary Care Provider: Adelia Homestead, MD ; Next Scheduled Visit: none scheduled Pulmonologist: Dr. Marygrace Snellen; 05/02/24  Insurance denied Yupelri  due to it being prescribed for off-label use. Pt saw Dr. Marygrace Snellen yesterday and they decided to continue with Brovana  and oxygen .   Patient assistance for Yupelri  is for patients without insurance or drug insurance and must be prescribed for the FDA approved indication of COPD, therefore assistance is not an option in this case unfortunately.  She will continue regular follow up with pulmonology.  Rainelle Bur, PharmD, BCPS, CPP Clinical Pharmacist Practitioner [facility] [phone]

## 2024-02-17 ENCOUNTER — Other Ambulatory Visit: Payer: Self-pay

## 2024-02-17 ENCOUNTER — Telehealth: Payer: Self-pay | Admitting: Cardiology

## 2024-02-17 DIAGNOSIS — R921 Mammographic calcification found on diagnostic imaging of breast: Secondary | ICD-10-CM | POA: Diagnosis not present

## 2024-02-17 DIAGNOSIS — R92342 Mammographic extreme density, left breast: Secondary | ICD-10-CM | POA: Diagnosis not present

## 2024-02-17 LAB — HM MAMMOGRAPHY

## 2024-02-17 MED ORDER — FUROSEMIDE 40 MG PO TABS: 40.0000 mg | ORAL_TABLET | Freq: Every day | ORAL | 10 refills | Status: DC

## 2024-02-17 NOTE — Telephone Encounter (Signed)
 Pt c/o swelling/edema: STAT if pt has developed SOB within 24 hours  If swelling, where is the swelling located?   Legs  How much weight have you gained and in what time span?   Yes  Have you gained 2 pounds in a day or 5 pounds in a week?   4 pounds since Saturday  Do you have a log of your daily weights (if so, list)?   Yes  Are you currently taking a fluid pill?   Yes  Are you currently SOB?   A little bit  Have you traveled recently in a car or plane for an extended period of time?   No  Daughter Lavonna Prader) stated patient's kidney doctor reduced patient's fluid medication.  Daughter stated patient has had 4 lbs weight gain since Saturday.  Daughter wants advice on next steps.

## 2024-02-17 NOTE — Telephone Encounter (Signed)
 Returned call to patient's daughter Lavonna Prader no answer.No voice mail.

## 2024-02-18 ENCOUNTER — Telehealth: Payer: Self-pay

## 2024-02-18 DIAGNOSIS — J849 Interstitial pulmonary disease, unspecified: Secondary | ICD-10-CM

## 2024-02-18 NOTE — Telephone Encounter (Signed)
 Spoke to patient she stated she has gained 4 lbs since this past Sunday. No sob.She is having increased swelling in left ankle.Stated kidney Dr decreased Spironolactone  to 25 mg 1/2 tablet.She took 1 25 mg tablet this morning.She takes Lasix  20 mg 2 tablets daily.Stated she took 3 tablets this past Mon and Tue.Stated directions on bottle say take 3 tablets daily but she has been taking 2 tablets every morning.Stated she needed to know if she needs to take 2 tablets or 3 tablets every morning.Advised I will send message to [facility] for advice.

## 2024-02-18 NOTE — Telephone Encounter (Signed)
 I am ok with 5L if that is what she needs. Can you please communicate with patient and let her know if we got to 5L she will not be able to use POC and will need tanks. And can you see how much oxygen  her home concentrator goes up to? Thanks!

## 2024-02-18 NOTE — Telephone Encounter (Signed)
 Copied from CRM [id number]. Topic: General - Other >> Feb 17, 2024  1:22 PM Hilton Lucky wrote: Reason for CRM: Patient calling to state that [facility] needs orders for small portable oxygen  tanks, as she is on Oxygen  Level 5 and her current portable machine cannot handle the new level (per patient).  I contacted [facility] for verification. They informed me pt contacted them to see if they had POC to accommodate 5L. [facility] informed her if she needed 5L POC she would have to switch to tanks as POC batter life runs out fast on 5L. [facility]'s current order states that pt is using 2-3L POC.  Dr. Marygrace Snellen can you please advise if you would like pt to be on 5L? Pt will have to switch to tanks if so. New order will also need to be placed if change in liter flow.

## 2024-02-19 MED ORDER — FUROSEMIDE 20 MG PO TABS: ORAL_TABLET | ORAL | 3 refills | Status: AC

## 2024-02-19 NOTE — Telephone Encounter (Signed)
Spoke with pt, Aware of dr crenshaw's recommendations. New script sent to the pharmacy  

## 2024-02-22 NOTE — Telephone Encounter (Signed)
 Pt states the POC ran out at 4L when she went to a doctors office. Pt states the O2 drops with exertion while on O2. Pt claims the home concentrator goes up to 5.

## 2024-02-22 NOTE — Telephone Encounter (Signed)
ATC x1 LVM for patient to call our office back. 

## 2024-02-23 ENCOUNTER — Other Ambulatory Visit: Payer: Self-pay | Admitting: Physician Assistant

## 2024-02-23 ENCOUNTER — Telehealth: Payer: Self-pay | Admitting: Cardiology

## 2024-02-23 DIAGNOSIS — E538 Deficiency of other specified B group vitamins: Secondary | ICD-10-CM

## 2024-02-23 NOTE — Telephone Encounter (Signed)
Order has been placed. Pt is aware and voiced her understanding. Nothing further needed.

## 2024-02-23 NOTE — Telephone Encounter (Signed)
 Pt c/o swelling: STAT is pt has developed SOB within 24 hours  How much weight have you gained and in what time span?  About 3 lbs since Saturday   If swelling, where is the swelling located?  Left leg + abdomen   Are you currently taking a fluid pill?  Yes  Are you currently SOB?  No   Do you have a log of your daily weights (if so, list)?  5/04: 102 lbs 5/05: 103 lbs  5/06: 104 lbs   Have you gained 3 pounds in a day or 5 pounds in a week?   Have you traveled recently?  No

## 2024-02-23 NOTE — Telephone Encounter (Signed)
 Spoke with pt, she is going up a lb a day. She is getting SOB with exertion outside of the home. She is also running out of oxygen  when she is out and about. She will contact pulmonary to see if they can get her something different for taking out of the house. She was encouraged to wear her compression hose while up and to take them off at bedtime. She was also cautioned about fluid intake. She was instructed to go ahead and take the extra furosemide  now. Offered patient an appointment to be seen but she would like to wait and see what happens with taking the furosemide .

## 2024-02-23 NOTE — Telephone Encounter (Signed)
 We need a new order for ambulatory and home O2 flow rate 6L continuous. Thanks!

## 2024-02-23 NOTE — Telephone Encounter (Signed)
 Spoke with patient and she states she would like call back from [place].

## 2024-02-24 ENCOUNTER — Telehealth (HOSPITAL_BASED_OUTPATIENT_CLINIC_OR_DEPARTMENT_OTHER): Payer: Self-pay

## 2024-02-24 ENCOUNTER — Inpatient Hospital Stay: Payer: Medicare PPO | Attending: Physician Assistant | Admitting: Physician Assistant

## 2024-02-24 ENCOUNTER — Inpatient Hospital Stay: Payer: Medicare PPO

## 2024-02-24 ENCOUNTER — Inpatient Hospital Stay: Payer: Medicare PPO | Attending: Physician Assistant

## 2024-02-24 DIAGNOSIS — D631 Anemia in chronic kidney disease: Secondary | ICD-10-CM | POA: Insufficient documentation

## 2024-02-24 DIAGNOSIS — E538 Deficiency of other specified B group vitamins: Secondary | ICD-10-CM

## 2024-02-24 DIAGNOSIS — D649 Anemia, unspecified: Secondary | ICD-10-CM

## 2024-02-24 DIAGNOSIS — N1832 Chronic kidney disease, stage 3b: Secondary | ICD-10-CM | POA: Diagnosis not present

## 2024-02-24 LAB — CBC WITH DIFFERENTIAL (CANCER CENTER ONLY)
Abs Immature Granulocytes: 0.03 10*3/uL (ref 0.00–0.07)
Basophils Absolute: 0.1 10*3/uL (ref 0.0–0.1)
Basophils Relative: 1 %
Eosinophils Absolute: 0.3 10*3/uL (ref 0.0–0.5)
Eosinophils Relative: 5 %
HCT: 29.4 % — ABNORMAL LOW (ref 36.0–46.0)
Hemoglobin: 9.4 g/dL — ABNORMAL LOW (ref 12.0–15.0)
Immature Granulocytes: 1 %
Lymphocytes Relative: 16 %
Lymphs Abs: 0.9 10*3/uL (ref 0.7–4.0)
MCH: 35.7 pg — ABNORMAL HIGH (ref 26.0–34.0)
MCHC: 32 g/dL (ref 30.0–36.0)
MCV: 111.8 fL — ABNORMAL HIGH (ref 80.0–100.0)
Monocytes Absolute: 0.3 10*3/uL (ref 0.1–1.0)
Monocytes Relative: 5 %
Neutro Abs: 4.2 10*3/uL (ref 1.7–7.7)
Neutrophils Relative %: 72 %
Platelet Count: 175 10*3/uL (ref 150–400)
RBC: 2.63 MIL/uL — ABNORMAL LOW (ref 3.87–5.11)
RDW: 12.5 % (ref 11.5–15.5)
WBC Count: 5.8 10*3/uL (ref 4.0–10.5)
nRBC: 0 % (ref 0.0–0.2)

## 2024-02-24 LAB — CMP (CANCER CENTER ONLY)
ALT: 14 U/L (ref 0–44)
AST: 27 U/L (ref 15–41)
Albumin: 4.5 g/dL (ref 3.5–5.0)
Alkaline Phosphatase: 60 U/L (ref 38–126)
Anion gap: 10 (ref 5–15)
BUN: 53 mg/dL — ABNORMAL HIGH (ref 8–23)
CO2: 37 mmol/L — ABNORMAL HIGH (ref 22–32)
Calcium: 9.7 mg/dL (ref 8.9–10.3)
Chloride: 89 mmol/L — ABNORMAL LOW (ref 98–111)
Creatinine: 1.45 mg/dL — ABNORMAL HIGH (ref 0.44–1.00)
GFR, Estimated: 35 mL/min — ABNORMAL LOW (ref >60)
Glucose, Bld: 116 mg/dL — ABNORMAL HIGH (ref 70–99)
Potassium: 4.7 mmol/L (ref 3.5–5.1)
Sodium: 136 mmol/L (ref 135–145)
Total Bilirubin: 0.7 mg/dL (ref 0.0–1.2)
Total Protein: 7.3 g/dL (ref 6.5–8.1)

## 2024-02-24 LAB — VITAMIN B12: Vitamin B-12: 713 pg/mL (ref 180–914)

## 2024-02-24 MED ORDER — CYANOCOBALAMIN 1000 MCG/ML IJ SOLN
1000.0000 ug | Freq: Once | INTRAMUSCULAR | Status: AC
Start: 2024-02-24 — End: 2024-02-24
  Administered 2024-02-24: 1000 ug via INTRAMUSCULAR
  Filled 2024-02-24: qty 1

## 2024-02-24 NOTE — Telephone Encounter (Signed)
Orders are signed. thanks

## 2024-02-24 NOTE — Telephone Encounter (Signed)
 Please advise per last note pt was on 3L   Copied from CRM [id number]. Topic: Clinical - Prescription Issue >> Feb 23, 2024  4:49 PM Tyronne Galloway wrote: Reason for CRM: Pt called in and stated [facility] stated they did not receive the order and she is on Oxygen  Level 5 and her current portable machine cannot handle the new level (per the patient). Patient would like to be able to receive the oxygen  tomorrow if possible. Please follow up with the patient to confirm the order has been placed.

## 2024-02-24 NOTE — Progress Notes (Signed)
 [facility] [phone]   [phone]  PROGRESS NOTE  Patient Care Team: Adelia Homestead, MD as PCP - General (Internal Medicine) Audery Blazing Deannie Fabian, MD as PCP - Cardiology (Cardiology) Billie Budge, RN as [facility] Wallace, Juana M, RN  CHIEF COMPLAINTS/PURPOSE OF CONSULTATION:  Macrocytic anemia  Vitamin B12 deficiency  HISTORY OF PRESENTING ILLNESS:  On review of the previous records, there is evidence of chronic anemia for several years stable between 10-11. Most recent labs from 2/29/204 showed Hgb had declined to 9.8, MCV 100.3. No other cytopenias. Iron  panel shows no evidence of iron  deficiency.  She has received periodic iron  infusions, most recently received IV feraheme  510 mg x 2 doses in October 2023. She was last given 2 units of PRBC in December 2023 after being hospitalized for fracture of left femur which required intramedullary nailing with ortho. Lastly, she receives retracrit 10,000 units every 4 weeks, last given on 12/30/2022.   INTERVAL HISTORY: Paula Ward 88 y.o. female returns for a follow up for macrocytic anemia.  She is accompanied by her husband for this visit.  She was last seen on 2/06/205 and in the interim, she has continued on vitamin B12 sublingual supplementation.   On exam today, [name] reports fatigue is persistent that has worsening with all her chronic conditions. She uses a walker/wheelchair to help with ambulation. She continues to have shortness of breath with minimal exertion. Her appetite is fair and she is trying to gain some weight. She gained 2 lbs since last month.. She is currently on 5 L of supplemental oxygen . She denies nausea, vomiting or bowel habit changes. She also denies any bleeding, bruising, or dark stools. She denies fevers, chills, sweats, chest pain or cough. She has no other complaints. Rest of the 10 ROS is below.   MEDICAL HISTORY:  Past Medical History:  Diagnosis  Date   Anemia    Angiodysplasia of stomach and duodenum    Aortic stenosis    Arthus phenomenon    AVM (arteriovenous malformation)    Blood transfusion without reported diagnosis    Breast cancer (HCC) 1989   Left   Candida esophagitis (HCC)    Cataract    Chronic respiratory failure (HCC)    CKD stage 3b, GFR 30-44 ml/min (HCC)    Corneal edema    Corneal epithelial basement membrane dystrophy    CREST syndrome (HCC)    GERD (gastroesophageal reflux disease)    w/ HH   Interstitial lung disease (HCC)    Nodule of kidney    Pericardial effusion    PONV (postoperative nausea and vomiting)    Pulmonary embolus (HCC) 2003   Pulmonary hypertension (HCC)    Rectal incontinence    Renal cell carcinoma (HCC)    Scleroderma (HCC)    Tubular adenoma of colon    Uterine polyp     SURGICAL HISTORY: Past Surgical History:  Procedure Laterality Date   APPENDECTOMY  1962   BREAST LUMPECTOMY  1989   left   CATARACT EXTRACTION, BILATERAL Bilateral 12/2013   COLONOSCOPY WITH PROPOFOL  N/A 04/15/2021   Procedure: COLONOSCOPY WITH PROPOFOL ;  Surgeon: Janel Medford, MD;  Location: [facility];  Service: Endoscopy;  Laterality: N/A;   ENTEROSCOPY N/A 01/18/2016   Procedure: ENTEROSCOPY;  Surgeon: Sergio Dandy, MD;  Location: [facility];  Service: Endoscopy;  Laterality: N/A;   ENTEROSCOPY N/A 05/24/2018   Procedure: ENTEROSCOPY;  Surgeon: Lajuan Pila, MD;  Location:  [facility];  Service: Endoscopy;  Laterality: N/A;   ENTEROSCOPY N/A 03/29/2021   Procedure: ENTEROSCOPY;  Surgeon: Nannette Babe, MD;  Location: [facility];  Service: Gastroenterology;  Laterality: N/A;   ENTEROSCOPY N/A 04/13/2021   Procedure: ENTEROSCOPY;  Surgeon: Ace Holder, MD;  [facility]: [facility];  Service: Gastroenterology;  Laterality: N/A;   ESOPHAGOGASTRODUODENOSCOPY (EGD) WITH PROPOFOL  N/A 12/21/2015   Procedure: ESOPHAGOGASTRODUODENOSCOPY (EGD) WITH PROPOFOL ;  Surgeon: Tobin Forts, MD;   [facility]: [facility];  Service: Endoscopy;  Laterality: N/A;   HOT HEMOSTASIS N/A 05/24/2018   Procedure: HOT HEMOSTASIS (ARGON PLASMA COAGULATION/BICAP);  Surgeon: Lajuan Pila, MD;  Location: [facility];  Service: Endoscopy;  Laterality: N/A;   HOT HEMOSTASIS N/A 03/29/2021   Procedure: HOT HEMOSTASIS (ARGON PLASMA COAGULATION/BICAP);  Surgeon: Nannette Babe, MD;  Location: [facility];  Service: Gastroenterology;  Laterality: N/A;   HOT HEMOSTASIS N/A 04/13/2021   Procedure: HOT HEMOSTASIS (ARGON PLASMA COAGULATION/BICAP);  Surgeon: Ace Holder, MD;  Location: [facility];  Service: Gastroenterology;  Laterality: N/A;   INTRAMEDULLARY (IM) NAIL INTERTROCHANTERIC Left 10/11/2022   Procedure: INTRAMEDULLARY (IM) NAIL INTERTROCHANTERIC;  Surgeon: Liliane Rei, MD;  [facility]: [facility];  Service: Orthopedics;  Laterality: Left;   IR GENERIC HISTORICAL  06/05/2016   IR RADIOLOGIST EVAL & MGMT 06/05/2016 Erica Hau, MD [facility] INTERV RAD   IVC Filter     KIDNEY SURGERY     left -"laser surgery by Dr. Nereida Banning- 5 yrs ago" no removal   RIGHT/LEFT HEART CATH AND CORONARY ANGIOGRAPHY N/A 04/25/2022   Procedure: RIGHT/LEFT HEART CATH AND CORONARY ANGIOGRAPHY;  Surgeon: Arnoldo Lapping, MD;  Location: [facility] INVASIVE CV LAB;  Service: Cardiovascular;  Laterality: N/A;   SCHLEROTHERAPY  05/24/2018   Procedure: [place];  Surgeon: Lajuan Pila, MD;  Location: [facility];  Service: Endoscopy;;   SUBMUCOSAL TATTOO INJECTION  04/13/2021   Procedure: SUBMUCOSAL TATTOO INJECTION;  Surgeon: Ace Holder, MD;  [facility]: [facility];  Service: Gastroenterology;;   TONSILLECTOMY     TUBAL LIGATION      SOCIAL HISTORY: Social History   Socioeconomic History   Marital status: Married    Spouse name: Not on file   Number of children: 2   Years of education: Not on file   Highest education level: Not on file  Occupational History   Occupation: Retired  Tobacco Use   Smoking status:  Never   Smokeless tobacco: Never  Vaping Use   Vaping status: Never Used  Substance and Sexual Activity   Alcohol use: No   Drug use: No   Sexual activity: Not Currently  Other Topics Concern   Not on file  Social History Narrative   Married '611 son- '65, 1 daughter '63; 6 children (2 adopted)SO- SOBRetirement- doing well. Marriage in good health. Patient has never smoked. Alcohol use- noPt gets regular exercise   Social Drivers of Health   Financial Resource Strain: Low Risk  (04/02/2022)   Overall Financial Resource Strain (CARDIA)    Difficulty of Paying Living Expenses: Not hard at all  Food Insecurity: No Food Insecurity (02/05/2024)   Hunger Vital Sign    Worried About Running Out of Food in the Last Year: Never true    Ran Out of Food in the Last Year: Never true  Transportation Needs: No Transportation Needs (02/05/2024)   PRAPARE - Administrator, Civil Service (Medical): No    Lack of Transportation (Non-Medical): No  Physical Activity: Inactive (04/02/2022)   Exercise Vital  Sign    Days of Exercise per Week: 0 days    Minutes of Exercise per Session: 0 min  Stress: No Stress Concern Present (04/02/2022)   [employer] of Occupational Health - Occupational Stress Questionnaire    Feeling of Stress : Not at all  Social Connections: Socially Integrated (04/02/2022)   Social Connection and Isolation Panel [NHANES]    Frequency of Communication with Friends and Family: More than three times a week    Frequency of Social Gatherings with Friends and Family: More than three times a week    Attends Religious Services: More than 4 times per year    Active Member of [employer] or Organizations: Yes    Attends Engineer, structural: More than 4 times per year    Marital Status: Married  Catering manager Violence: Not At Risk (02/05/2024)   Humiliation, Afraid, Rape, and Kick questionnaire    Fear of Current or Ex-Partner: No    Emotionally Abused: No     Physically Abused: No    Sexually Abused: No    FAMILY HISTORY: Family History  Problem Relation Age of Onset   Bladder Cancer Father    Diabetes Father    Prostate cancer Father    Alzheimer's disease Mother    Diabetes Sister    Lung cancer Sister         smoker   Esophageal cancer Paternal Uncle    Colon cancer Neg Hx     ALLERGIES:  is allergic to codeine , other, erythromycin, iodinated contrast media, lisinopril, mirtazapine , mycophenolate mofetil, and sulfa  antibiotics.  MEDICATIONS:  Current Outpatient Medications  Medication Sig Dispense Refill   acetaminophen  (TYLENOL ) 325 MG tablet Take 2 tablets (650 mg total) by mouth every 6 (six) hours as needed for moderate pain. 30 tablet 0   albuterol  (PROVENTIL ) (2.5 MG/3ML) 0.083% nebulizer solution Take 3 mLs (2.5 mg total) by nebulization every 6 (six) hours as needed for wheezing or shortness of breath. 75 mL 12   albuterol  (VENTOLIN  HFA) 108 (90 Base) MCG/ACT inhaler INHALE 2 PUFFS INTO THE LUNGS EVERY 6 HOURS AS NEEDED FOR WHEEZING OR SHORTNESS OF BREATH (Patient taking differently: Inhale 2 puffs into the lungs every 6 (six) hours as needed for wheezing or shortness of breath.) 6.7 g 3   arformoterol  (BROVANA ) 15 MCG/2ML NEBU Take 2 mLs (15 mcg total) by nebulization 2 (two) times daily. 120 mL 6   augmented betamethasone dipropionate (DIPROLENE-AF) 0.05 % cream Apply 1 application  topically 2 (two) times daily as needed (for irritation).     clobetasol ointment (TEMOVATE) 0.05 % Apply 1 Application topically 2 (two) times daily as needed (for irritation).     diltiazem  (CARDIZEM ) 30 MG tablet Take 1 tablet (30 mg total) by mouth every 12 (twelve) hours. 60 tablet 11   docusate sodium  (COLACE) 100 MG capsule Take 1 capsule (100 mg total) by mouth 2 (two) times daily. (Patient taking differently: Take 100 mg by mouth in the morning.) 10 capsule 0   empagliflozin  (JARDIANCE ) 10 MG TABS tablet Take 1 tablet (10 mg total) by mouth  daily. 30 tablet 3   famotidine  (PEPCID ) 20 MG tablet TAKE 1 TABLET BY MOUTH AT BEDTIME 30 tablet 0   fluticasone  (FLONASE ) 50 MCG/ACT nasal spray USE 1 SPRAY(S) IN EACH NOSTRIL ONCE DAILY AS NEEDED FOR ALLERGIES 16 g 0   furosemide  (LASIX ) 20 MG tablet Take 2 tablets ( 40 mg ) every morning may take extra 2 tablets for weight  gain of 3 lbs 360 tablet 3   guaiFENesin  (MUCINEX ) 600 MG 12 hr tablet Take 600 mg by mouth 2 (two) times daily as needed.     ipratropium (ATROVENT ) 0.03 % nasal spray Place 2 sprays into both nostrils every 12 (twelve) hours. (Patient taking differently: Place 2 sprays into both nostrils at bedtime.) 30 mL 12   latanoprost  (XALATAN ) 0.005 % ophthalmic solution Place 1 drop into both eyes at bedtime.     magic mouthwash SOLN Take 10 mLs by mouth 4 (four) times daily as needed for mouth pain. 15 mL 2   mupirocin  ointment (BACTROBAN ) 2 % Apply 1 Application topically 2 (two) times daily as needed (for irritation).     Nutritional Supplements (ENSURE HIGH PROTEIN) LIQD Take 0.5 Bottles by mouth daily as needed (for supplementation).     oseltamivir  (TAMIFLU ) 30 MG capsule Take 1 capsule (30 mg total) by mouth daily. 5 capsule 0   OXYGEN  Inhale 2-3 L/min into the lungs continuous.     pantoprazole  (PROTONIX ) 40 MG tablet TAKE 1 TABLET BY MOUTH TWICE DAILY BEFORE A MEAL 180 tablet 0   polyethylene glycol (MIRALAX  / GLYCOLAX ) 17 g packet Take 17 g by mouth daily. 14 each 0   revefenacin  (YUPELRI ) 175 MCG/3ML nebulizer solution Take 3 mLs (175 mcg total) by nebulization daily. 90 mL 6   spironolactone  (ALDACTONE ) 25 MG tablet Take 1/2 tablet ( 12.5 mg ) every morning     sucralfate  (CARAFATE ) 1 GM/10ML suspension TAKE  10 ML BY MOUTH EVERY 6 HOURS AS NEEDED FOR REFLUX 420 mL 0   triamcinolone  cream (KENALOG ) 0.1 % Apply 1 Application topically See admin instructions. Apply to affected leg(s) after showers and up to 2 additional times a day as needed for irritation     No current  facility-administered medications for this visit.    REVIEW OF SYSTEMS:   Constitutional: ( - ) fevers, ( - )  chills , ( - ) night sweats Eyes: ( - ) blurriness of vision, ( - ) double vision, ( - ) watery eyes Ears, nose, mouth, throat, and face: ( - ) mucositis, ( - ) sore throat Respiratory: ( - ) cough, ( + ) dyspnea, ( - ) wheezes Cardiovascular: ( - ) palpitation, ( - ) chest discomfort, ( - ) lower extremity swelling Gastrointestinal:  ( - ) nausea, ( - ) heartburn, ( - ) change in bowel habits Skin: ( - ) abnormal skin rashes Lymphatics: ( - ) new lymphadenopathy, ( - ) easy bruising Neurological: ( - ) numbness, ( - ) tingling, ( - ) new weaknesses Behavioral/Psych: ( - ) mood change, ( - ) new changes  All other systems were reviewed with the patient and are negative.  PHYSICAL EXAMINATION: ECOG PERFORMANCE STATUS: 1 - Symptomatic but completely ambulatory  There were no vitals filed for this visit.   There were no vitals filed for this visit.    GENERAL: well appearing elderly female in NAD  SKIN: skin color, texture, turgor are normal, no rashes or significant lesions EYES: conjunctiva are pink and non-injected, sclera clear LUNGS: clear to auscultation and percussion with normal breathing effort HEART: regular rate & rhythm and no murmurs and no lower extremity edema Musculoskeletal: no cyanosis of digits and no clubbing  PSYCH: alert & oriented x 3, fluent speech NEURO: no focal motor/sensory deficits  LABORATORY DATA:  I have reviewed the data as listed    Latest Ref Rng & Units  02/24/2024    1:36 PM 01/27/2024    1:22 PM 12/30/2023    1:17 PM  CBC  WBC 4.0 - 10.5 K/uL 5.8  5.9  5.2   Hemoglobin 12.0 - 15.0 g/dL 9.4  9.7  9.6   Hematocrit 36.0 - 46.0 % 29.4  30.5  30.7   Platelets 150 - 400 K/uL 175  145  149        Latest Ref Rng & Units 02/24/2024    1:36 PM 01/27/2024    1:22 PM 12/30/2023    1:17 PM  CMP  Glucose 70 - 99 mg/dL 811  914  782   BUN 8 -  23 mg/dL 53  52  54   Creatinine 0.44 - 1.00 mg/dL 9.56  2.13  0.86   Sodium 135 - 145 mmol/L 136  138  138   Potassium 3.5 - 5.1 mmol/L 4.7  4.6  5.0   Chloride 98 - 111 mmol/L 89  94  95   CO2 22 - 32 mmol/L 37  38  36   Calcium  8.9 - 10.3 mg/dL 9.7  9.6  9.2   Total Protein 6.5 - 8.1 g/dL 7.3  7.0  6.9   Total Bilirubin 0.0 - 1.2 mg/dL 0.7  0.6  0.6   Alkaline Phos 38 - 126 U/L 60  56  64   AST 15 - 41 U/L 27  25  26    ALT 0 - 44 U/L 14  11  12     RADIOGRAPHIC STUDIES: I have personally reviewed the radiological images as listed and agreed with the findings in the report. VAS US  CAROTID Result Date: 01/31/2024 Carotid Arterial Duplex Study Patient Name:  ADREE MCCALEB  Date of Exam:   01/29/2024 Medical Rec #: [id number]        Accession #:    [id number] Date of Birth: [birth year]        Patient Gender: F Patient Age:   52 years Exam Location:  [facility] Procedure:      VAS US  CAROTID Referring Phys: CHRISTINE MCCUEN --------------------------------------------------------------------------------  Indications:   Visual disturbance. Risk Factors:  Hypertension, no history of smoking. Other Factors: CKD, CHF, AS. Performing Technologist: Lyndal Sandy RDMS, RVT, RDCS  Examination Guidelines: A complete evaluation includes B-mode imaging, spectral Doppler, color Doppler, and power Doppler as needed of all accessible portions of each vessel. Bilateral testing is considered an integral part of a complete examination. Limited examinations for reoccurring indications may be performed as noted.  Right Carotid Findings: +----------+--------+--------+--------+-------------------------+--------+           PSV cm/sEDV cm/sStenosisPlaque Description       Comments +----------+--------+--------+--------+-------------------------+--------+ CCA Prox  64      0                                                 +----------+--------+--------+--------+-------------------------+--------+ CCA Distal66       4                                                 +----------+--------+--------+--------+-------------------------+--------+ ICA Prox  72      6       1-39%   calcific and heterogenous         +----------+--------+--------+--------+-------------------------+--------+  ICA Mid   73      9                                                 +----------+--------+--------+--------+-------------------------+--------+ ICA Distal80      27                                                +----------+--------+--------+--------+-------------------------+--------+ ECA       58      0                                                 +----------+--------+--------+--------+-------------------------+--------+ +----------+--------+-------+----------------+-------------------+           PSV cm/sEDV cmsDescribe        Arm Pressure (mmHG) +----------+--------+-------+----------------+-------------------+ [id number]     4      Multiphasic, [id number]                 +----------+--------+-------+----------------+-------------------+ +---------+--------+--+--------+-+---------+ VertebralPSV cm/s75EDV cm/s5Antegrade +---------+--------+--+--------+-+---------+  Left Carotid Findings: +----------+--------+--------+--------+-------------------------+--------+           PSV cm/sEDV cm/sStenosisPlaque Description       Comments +----------+--------+--------+--------+-------------------------+--------+ CCA Prox  71      13                                                +----------+--------+--------+--------+-------------------------+--------+ CCA Distal58      5                                                 +----------+--------+--------+--------+-------------------------+--------+ ICA Prox  112     14      1-39%   calcific and heterogenous         +----------+--------+--------+--------+-------------------------+--------+ ICA Mid   87      5                                                  +----------+--------+--------+--------+-------------------------+--------+ ICA Distal76      11                                                +----------+--------+--------+--------+-------------------------+--------+ ECA       110     0                                                 +----------+--------+--------+--------+-------------------------+--------+ +----------+--------+--------+----------------+-------------------+  PSV cm/sEDV cm/sDescribe        Arm Pressure (mmHG) +----------+--------+--------+----------------+-------------------+ Subclavian121     2       Multiphasic, [id number]                 +----------+--------+--------+----------------+-------------------+ +---------+--------+--+--------+--+---------+ VertebralPSV cm/s95EDV cm/s15Antegrade +---------+--------+--+--------+--+---------+   Summary: Right Carotid: Velocities in the right ICA are consistent with a 1-39% stenosis. Left Carotid: Velocities in the left ICA are consistent with a 1-39% stenosis. Vertebrals:  Bilateral vertebral arteries demonstrate antegrade flow. Subclavians: Normal flow hemodynamics were seen in bilateral subclavian              arteries. *See table(s) above for measurements and observations.  Electronically signed by Antionette Kirks MD on 01/31/2024 at 7:30:17 PM.    Final     ASSESSMENT & PLAN Paula Ward is a 89 y.o. female who presents to the hematology clinic for evaluation of macrocytic anemia  #Macrocytic anemia: #Vitamin B12 deficiency: #Anemia of chronic disease --Suspect multifactorial including vitamin b12 deficinecy, CKD and chronic disease.  --Iron  panel from 12/30/2022 reviewed without deficiency.  --Patient has retacrit  10,000 units q 4 weeks, last received 12/30/2022. --Workup from 02/12/2023 didn't reveal hemolysis, paraproteinemia, folate deficiency. There was evidence of vitamin B12 deficiency so patient is currently on vitamin  B12 SL replacement every other day.   --bone marrow biopsy on 08/14/2023 showed no clear cause for the macrocytosis, though excessive iron  deposition was noted. HFE genetic testing was negative in November 2024 -- no evidence of cirrhosis on prior imaging from 2023 (Chest imaging, but liver visualized). Will order US  abdomen to further evaluate --Labs from today show stable anemia with Hgb 9.4, MCV 111.8. Vitamin B12 level is normal at 713.  --recommend to continue with monthly B12 injection. Will consider adding retacrit  injection with next injection.  --RTC q 4 weeks for labs/injection with clinic visit in 3 months time.   No orders of the defined types were placed in this encounter.   All questions were answered. The patient knows to call the clinic with any problems, questions or concerns.  I have spent a total of 30 minutes minutes of face-to-face and non-face-to-face time, preparing to see the patient, obtaining and/or reviewing separately obtained history, performing a medically appropriate examination, counseling and educating the patient, ordering tests/procedures,documenting clinical information in the electronic health record, and care coordination.   Wyline Hearing PA-C Dept of Hematology and Oncology [facility] Phone: [phone]

## 2024-02-29 LAB — METHYLMALONIC ACID, SERUM: Methylmalonic Acid, Quantitative: 732 nmol/L — ABNORMAL HIGH (ref 0–378)

## 2024-03-01 DIAGNOSIS — I509 Heart failure, unspecified: Secondary | ICD-10-CM | POA: Diagnosis not present

## 2024-03-09 ENCOUNTER — Ambulatory Visit (HOSPITAL_COMMUNITY)
Admission: RE | Admit: 2024-03-09 | Discharge: 2024-03-09 | Disposition: A | Discharge status: Home / Self Care | Source: Ambulatory Visit | Attending: Physician Assistant | Admitting: Physician Assistant

## 2024-03-09 DIAGNOSIS — D649 Anemia, unspecified: Secondary | ICD-10-CM | POA: Insufficient documentation

## 2024-03-09 DIAGNOSIS — R932 Abnormal findings on diagnostic imaging of liver and biliary tract: Secondary | ICD-10-CM | POA: Diagnosis not present

## 2024-03-14 ENCOUNTER — Telehealth

## 2024-03-16 ENCOUNTER — Other Ambulatory Visit: Payer: Self-pay

## 2024-03-16 NOTE — Patient Instructions (Signed)
 Visit Information  Thank you for taking time to visit with me today. Please don't hesitate to contact me if I can be of assistance to you before our next scheduled appointment.  Our next appointment is by telephone on 04/06/24 at 2:30 pm Please call the care guide team at [phone] if you need to cancel or reschedule your appointment.   Following is a copy of your care plan:   Goals Addressed             This Visit's Progress    VBCI RN Care Plan       Problems:  Interstitial Lung Disease, pulmonary HTN, HF  Goal: Over the next 90 days the Patient will attend all scheduled medical appointments: pulmonology, oncology, clinical pharmacy as evidenced by patient report or review of chart        continue to work with RN Care Manager and/or Social Worker to address care management and care coordination needs related to ILD as evidenced by adherence to care management team scheduled appointments     demonstrate Ongoing adherence to prescribed treatment plan for ILD as evidenced by patient report or review of chart take all medications exactly as prescribed and will call provider for medication related questions as evidenced by patient report or review of chart     Interventions:   Evaluation of current treatment plan related to ILD, pulmonary HTN, HF self-management and patient's adherence to plan as established by provider. Discussed plans with patient for ongoing care management follow up and provided patient with direct contact information for care management team Advised patient to provide copy of advance directive at next pcp visit Discussed Palliative care with patient-patient receptive to referral.  Patient Self-Care Activities:  Attend all scheduled provider appointments Call provider office for new concerns or questions  Perform all self care activities independently  Take medications as prescribed   Continue to monitor for signs/symptoms of HF exacerbation and contact  cardiologist with questions or concern Contact pulmonologist with questions concerns regarding your lung condition or if worsening. Per pulmonologist,  maintain oxygen  saturation above 90 and notify pulmonologist if unable to.  Plan:  Telephone follow up appointment with care management team member scheduled for:  04/06/24 at 2:30 pm      Please call the Suicide and Crisis Lifeline: 988 call the [place]  National Suicide Prevention Lifeline: 575-486-9201 or TTY: 401 156 0828 TTY [phone]) to talk to a trained counselor if you are experiencing a Mental Health or Behavioral Health Crisis or need someone to talk to.  Patient verbalizes understanding of instructions and care plan provided today and agrees to view in MyChart. Active MyChart status and patient understanding of how to access instructions and care plan via MyChart confirmed with patient.     Lindi Revering, RN, MSN, BSN, CCM [place]  [facility], Population Health Case Manager Phone: [phone]

## 2024-03-16 NOTE — Patient Outreach (Signed)
 Complex Care Management   Visit Note  03/16/2024  Name:  Paula Ward MRN: [id number] DOB: 1936-01-31  Situation: Referral received for Complex Care Management related to Heart Failure and ILD, pulmonary HTN I obtained verbal consent from Patient.  Visit completed with patient  on the phone  Background:   Past Medical History:  Diagnosis Date   Anemia    Angiodysplasia of stomach and duodenum    Aortic stenosis    Arthus phenomenon    AVM (arteriovenous malformation)    Blood transfusion without reported diagnosis    Breast cancer (HCC) 1989   Left   Candida esophagitis (HCC)    Cataract    Chronic respiratory failure (HCC)    CKD stage 3b, GFR 30-44 ml/min (HCC)    Corneal edema    Corneal epithelial basement membrane dystrophy    CREST syndrome (HCC)    GERD (gastroesophageal reflux disease)    w/ HH   Interstitial lung disease (HCC)    Nodule of kidney    Pericardial effusion    PONV (postoperative nausea and vomiting)    Pulmonary embolus (HCC) 2003   Pulmonary hypertension (HCC)    Rectal incontinence    Renal cell carcinoma (HCC)    Scleroderma (HCC)    Tubular adenoma of colon    Uterine polyp     Assessment: Patient reports increased oxygen  need. Patient reports pulmonologist increased oxygen  up to 6l/[place]. She continues to monitor O2 sats and reports sats range from 93%-97% resting. She acknowledges that she is able to do very little activity without becoming SOB. Patient declines to discuss hospice benefits, but is receptive to Palliative care referral to assist with symptom management and quality of life.  Patient Reported Symptoms:  Cognitive Cognitive Status: Alert and oriented to person, place, and time, Insightful and able to interpret abstract concepts, Normal speech and language skills      Neurological Neurological Review of Symptoms: No symptoms reported    HEENT HEENT Symptoms Reported: No symptoms reported      Cardiovascular Cardiovascular  Symptoms Reported: No symptoms reported (per review of chart prn furosemide  taken last month. patient denies leg swelling. she continues to weigh and record weights daily and monitor fluid intake.) Does patient have uncontrolled Hypertension?: No Cardiovascular Conditions: Heart failure, Hypertension Cardiovascular Management Strategies: Medication therapy, Routine screening, Weight management Do You Have a Working Readable Scale?: Yes  Respiratory Respiratory Symptoms Reported: Shortness of breath Other Respiratory Symptoms: patient reports SOB worsened with activity. per patient report and review of chart oxygen  5-6l/[place]. patient reports oxygen  sat 93-97% on oxygen . patient has in home care private pay 4 days/week who assist patiebt with personal care needs. Additional Respiratory Details: patient reports uses nebulizer machine as prescribed daily., reviewed prn albuterol  every 6 hours as needed. Respiratory Conditions: Shortness of breath (ILD, pulmonary htn)  Endocrine Patient reports the following symptoms related to hypoglycemia or hyperglycemia : No symptoms reported    Gastrointestinal Gastrointestinal Symptoms Reported: No symptoms reported      Genitourinary Genitourinary Symptoms Reported: Frequency Additional Genitourinary Details: patient reports due to furosemide     Integumentary Integumentary Symptoms Reported: Not assessed    Musculoskeletal Musculoskelatal Symptoms Reviewed: Weakness, Difficulty walking Additional Musculoskeletal Details: mobility limited due to chronic condition and respiratory status as well as oxygen  tanks        Psychosocial Psychosocial Symptoms Reported: Not assessed            02/05/2024   10:38 AM  Depression screen  PHQ 2/9  Decreased Interest 1  Down, Depressed, Hopeless 0  PHQ - 2 Score 1    Vitals:   03/16/24 1355  BP: (!) 118/55  Pulse: 68  SpO2: 94%    Medications Reviewed Today     Reviewed by Lajuane Leatham M, RN  (Registered Nurse) on 03/16/24 at 1353  Med List Status: <None>   Medication Order Taking? Sig Documenting Provider Last Dose Status Informant  acetaminophen  (TYLENOL ) 325 MG tablet [id number] Yes Take 2 tablets (650 mg total) by mouth every 6 (six) hours as needed for moderate pain. Adolph Hoop, PA-C Taking Active Self           Med Note Frieda Jew I   JYN Aug 29, 2023 10:03 PM)    albuterol  (PROVENTIL ) (2.5 MG/3ML) 0.083% nebulizer solution [id number] Yes Take 3 mLs (2.5 mg total) by nebulization every 6 (six) hours as needed for wheezing or shortness of breath. Pokhrel, Laxman, MD Taking Active   albuterol  (VENTOLIN  HFA) 108 (90 Base) MCG/ACT inhaler [id number] Yes INHALE 2 PUFFS INTO THE LUNGS EVERY 6 HOURS AS NEEDED FOR WHEEZING OR SHORTNESS OF BREATH  Patient taking differently: Inhale 2 puffs into the lungs every 6 (six) hours as needed for wheezing or shortness of breath.   Adelia Homestead, MD Taking Active Self  arformoterol  (BROVANA ) 15 MCG/2ML NEBU [id number] Yes Take 2 mLs (15 mcg total) by nebulization 2 (two) times daily. Hunsucker, Archer Kobs, MD Taking Active   augmented betamethasone dipropionate (DIPROLENE-AF) 0.05 % cream [id number] No Apply 1 application  topically 2 (two) times daily as needed (for irritation).  Patient not taking: Reported on 03/16/2024   [provider] Not Taking Active Self           Med Note Frieda Jew I   Sat Aug 29, 2023  9:58 PM)    clobetasol ointment (TEMOVATE) 0.05 % [id number] No Apply 1 Application topically 2 (two) times daily as needed (for irritation).  Patient not taking: Reported on 03/16/2024   [provider] Not Taking Active Self           Med Note Frieda Jew I   Sat Aug 29, 2023  9:59 PM)    diltiazem  (CARDIZEM ) 30 MG tablet [id number] Yes Take 1 tablet (30 mg total) by mouth every 12 (twelve) hours. Carie Charity, NP Taking Active   docusate sodium  (COLACE) 100 MG capsule [id number] Yes  Take 1 capsule (100 mg total) by mouth 2 (two) times daily.  Patient taking differently: Take 100 mg by mouth in the morning.   Edmisten, Onesimo Bijou, PA Taking Active Self  empagliflozin  (JARDIANCE ) 10 MG TABS tablet [id number] No Take 1 tablet (10 mg total) by mouth daily.  Patient not taking: Reported on 03/16/2024   Faith Homes, MD Not Taking Active Self  famotidine  (PEPCID ) 20 MG tablet [id number] Yes TAKE 1 TABLET BY MOUTH AT BEDTIME Nandigam, Kavitha V, MD Taking Active   fluticasone  (FLONASE ) 50 MCG/ACT nasal spray [id number] Yes USE 1 SPRAY(S) IN EACH NOSTRIL ONCE DAILY AS NEEDED FOR ALLERGIES Adelia Homestead, MD Taking Active   furosemide  (LASIX ) 20 MG tablet [id number] Yes Take 2 tablets ( 40 mg ) every morning may take extra 2 tablets for weight gain of 3 lbs Lenise Quince, MD Taking Active   guaiFENesin  (MUCINEX ) 600 MG 12 hr tablet [id number] Yes Take 600 mg by mouth 2 (two) times daily as needed. [provider] Taking Active Self  Med Note (TOUSEY, LAINE M   Fri Sep 04, 2023  3:42 PM) 09/04/23: Reports during [facility] call she takes one half pill QD  ipratropium (ATROVENT ) 0.03 % nasal spray [id number] Yes Place 2 sprays into both nostrils every 12 (twelve) hours.  Patient taking differently: Place 2 sprays into both nostrils at bedtime.   Wilfredo Hanly, MD Taking Active Self  latanoprost  (XALATAN ) 0.005 % ophthalmic solution [id number] Yes Place 1 drop into both eyes at bedtime. [provider] Taking Active Self  magic mouthwash SOLN [id number] Yes Take 10 mLs by mouth 4 (four) times daily as needed for mouth pain. Leona Rake, MD Taking Active Self           Med Note [facility]   YQM Aug 29, 2023 10:00 PM)    mupirocin  ointment (BACTROBAN ) 2 % [id number] No Apply 1 Application topically 2 (two) times daily as needed (for irritation).  Patient not taking: Reported on 03/16/2024   [provider] Not Taking Active Self            Med Note (TOUSEY, LAINE M   Fri Sep 04, 2023  3:44 PM) 09/04/23: Reports during [facility] call has and is taking only if needed   Nutritional Supplements (ENSURE HIGH PROTEIN) LIQD [id number] Yes Take 0.5 Bottles by mouth daily as needed (for supplementation). [provider] Taking Active Self  oseltamivir  (TAMIFLU ) 30 MG capsule [id number] No Take 1 capsule (30 mg total) by mouth daily.  Patient not taking: Reported on 03/16/2024   Zorita Hiss, NP Not Taking Active   OXYGEN  [id number] Yes Inhale 2-3 L/min into the lungs continuous. [provider] Taking Active Self           Med Note Lajuana Pilar, Jacky Massing   Wed Mar 16, 2024  1:46 PM) Reports oxygen  6L/[place]  pantoprazole  (PROTONIX ) 40 MG tablet [id number] Yes TAKE 1 TABLET BY MOUTH TWICE DAILY BEFORE A MEAL Nandigam, Kavitha V, MD Taking Active   polyethylene glycol (MIRALAX  / GLYCOLAX ) 17 g packet [id number] No Take 17 g by mouth daily.  Patient not taking: Reported on 03/16/2024   Dolphus Friday, [place] Not Taking Active Self           Med Note (TOUSEY, LAINE M   Fri Sep 04, 2023  3:46 PM) 09/04/23: Reports during [facility] call has not needed recently- takes prn   revefenacin  (YUPELRI ) 175 MCG/3ML nebulizer solution [id number] No Take 3 mLs (175 mcg total) by nebulization daily.  Patient not taking: Reported on 03/16/2024   Guerry Leek, MD Not Taking Active   spironolactone  (ALDACTONE ) 25 MG tablet [id number] Yes Take 1/2 tablet ( 12.5 mg ) every morning Lenise Quince, MD Taking Active   sucralfate  (CARAFATE ) 1 GM/10ML suspension [id number] Yes TAKE  10 ML BY MOUTH EVERY 6 HOURS AS NEEDED FOR REFLUX Nandigam, Kavitha V, MD Taking Active   triamcinolone  cream (KENALOG ) 0.1 % [id number] Yes Apply 1 Application topically See admin instructions. Apply to affected leg(s) after showers and up to 2 additional times a day as needed for irritation [provider] Taking Active Self           Med Note Frieda Jew I   Sat  Aug 29, 2023 10:03 PM)            Recommendation:   PCP Follow-up AWV scheduled for 03/24/24 and Primary provider visit scheduled for 03/29/24 Palliative care referral. Patient is receptive to a Palliative care referral for  symptom management and assist with quality of life. Patient to contact pulmonologist or provider if health questions or concerns or if condition worsens  Follow Up Plan:   Telephone follow up appointment date/time:  03/14/24 11:30 am  Lindi Revering, RN, MSN, BSN, CCM [place]  [facility], Population Health Case Manager Phone: [phone]

## 2024-03-17 ENCOUNTER — Telehealth: Payer: Self-pay | Admitting: Pulmonary Disease

## 2024-03-17 DIAGNOSIS — J849 Interstitial pulmonary disease, unspecified: Secondary | ICD-10-CM

## 2024-03-17 DIAGNOSIS — J9611 Chronic respiratory failure with hypoxia: Secondary | ICD-10-CM

## 2024-03-17 NOTE — Telephone Encounter (Signed)
 Rc'd order from [employer] for O2 (POC) . Will fwd to Dr. Marvina Slough for signature.

## 2024-03-18 ENCOUNTER — Other Ambulatory Visit: Payer: Self-pay | Admitting: Physician Assistant

## 2024-03-18 ENCOUNTER — Ambulatory Visit: Payer: Self-pay | Admitting: Physician Assistant

## 2024-03-18 DIAGNOSIS — E538 Deficiency of other specified B group vitamins: Secondary | ICD-10-CM

## 2024-03-18 DIAGNOSIS — D649 Anemia, unspecified: Secondary | ICD-10-CM

## 2024-03-21 ENCOUNTER — Telehealth: Payer: Self-pay | Admitting: Cardiology

## 2024-03-21 NOTE — Telephone Encounter (Signed)
 Pt hung up on me after introducing myself.

## 2024-03-21 NOTE — Telephone Encounter (Signed)
 Pt c/o swelling/edema: STAT if pt has developed SOB within 24 hours  If swelling, where is the swelling located? Legs   How much weight have you gained and in what time span? 2lbs in a day   Have you gained 2 pounds in a day or 5 pounds in a week? Yes  Do you have a log of your daily weights (if so, list)? N/A  Are you currently taking a fluid pill? Yes   Are you currently SOB? No   Have you traveled recently in a car or plane for an extended period of time?

## 2024-03-22 NOTE — Telephone Encounter (Signed)
 2nd call attempt to pt- no answer, unable to leave message no DPR on file.

## 2024-03-22 NOTE — Telephone Encounter (Signed)
 Patient is returning call + mentions she lost 2 lbs of fluid last night.

## 2024-03-22 NOTE — Telephone Encounter (Signed)
 Attempted phone call to pt and left voicemail message to contact RN at [phone].

## 2024-03-23 ENCOUNTER — Telehealth: Payer: Self-pay

## 2024-03-23 ENCOUNTER — Observation Stay (HOSPITAL_COMMUNITY)

## 2024-03-23 ENCOUNTER — Encounter

## 2024-03-23 ENCOUNTER — Encounter (HOSPITAL_COMMUNITY): Payer: Self-pay

## 2024-03-23 ENCOUNTER — Other Ambulatory Visit: Payer: Self-pay

## 2024-03-23 ENCOUNTER — Inpatient Hospital Stay: Payer: Medicare PPO | Attending: Physician Assistant

## 2024-03-23 ENCOUNTER — Emergency Department (HOSPITAL_COMMUNITY)

## 2024-03-23 ENCOUNTER — Inpatient Hospital Stay (HOSPITAL_COMMUNITY)
Admission: EM | Admit: 2024-03-23 | Discharge: 2024-03-26 | DRG: 189 | Disposition: A | Discharge status: Hospice | Attending: Family Medicine | Admitting: Family Medicine

## 2024-03-23 ENCOUNTER — Inpatient Hospital Stay: Payer: Medicare PPO

## 2024-03-23 VITALS — BP 142/51 | HR 75 | Temp 98.3°F | Resp 13

## 2024-03-23 DIAGNOSIS — Z79899 Other long term (current) drug therapy: Secondary | ICD-10-CM | POA: Diagnosis not present

## 2024-03-23 DIAGNOSIS — J9602 Acute respiratory failure with hypercapnia: Secondary | ICD-10-CM | POA: Diagnosis not present

## 2024-03-23 DIAGNOSIS — I6523 Occlusion and stenosis of bilateral carotid arteries: Secondary | ICD-10-CM | POA: Diagnosis not present

## 2024-03-23 DIAGNOSIS — J9601 Acute respiratory failure with hypoxia: Secondary | ICD-10-CM | POA: Diagnosis present

## 2024-03-23 DIAGNOSIS — S0990XA Unspecified injury of head, initial encounter: Secondary | ICD-10-CM | POA: Diagnosis present

## 2024-03-23 DIAGNOSIS — K219 Gastro-esophageal reflux disease without esophagitis: Secondary | ICD-10-CM | POA: Diagnosis present

## 2024-03-23 DIAGNOSIS — I3139 Other pericardial effusion (noninflammatory): Secondary | ICD-10-CM | POA: Diagnosis present

## 2024-03-23 DIAGNOSIS — Z888 Allergy status to other drugs, medicaments and biological substances status: Secondary | ICD-10-CM

## 2024-03-23 DIAGNOSIS — Z853 Personal history of malignant neoplasm of breast: Secondary | ICD-10-CM

## 2024-03-23 DIAGNOSIS — J9621 Acute and chronic respiratory failure with hypoxia: Principal | ICD-10-CM | POA: Diagnosis present

## 2024-03-23 DIAGNOSIS — Z85528 Personal history of other malignant neoplasm of kidney: Secondary | ICD-10-CM

## 2024-03-23 DIAGNOSIS — Z7984 Long term (current) use of oral hypoglycemic drugs: Secondary | ICD-10-CM

## 2024-03-23 DIAGNOSIS — I5032 Chronic diastolic (congestive) heart failure: Secondary | ICD-10-CM | POA: Diagnosis not present

## 2024-03-23 DIAGNOSIS — Z833 Family history of diabetes mellitus: Secondary | ICD-10-CM | POA: Diagnosis not present

## 2024-03-23 DIAGNOSIS — N1832 Chronic kidney disease, stage 3b: Secondary | ICD-10-CM | POA: Diagnosis present

## 2024-03-23 DIAGNOSIS — F419 Anxiety disorder, unspecified: Secondary | ICD-10-CM | POA: Diagnosis present

## 2024-03-23 DIAGNOSIS — J9622 Acute and chronic respiratory failure with hypercapnia: Secondary | ICD-10-CM | POA: Diagnosis present

## 2024-03-23 DIAGNOSIS — G9341 Metabolic encephalopathy: Secondary | ICD-10-CM | POA: Diagnosis present

## 2024-03-23 DIAGNOSIS — J849 Interstitial pulmonary disease, unspecified: Principal | ICD-10-CM | POA: Diagnosis present

## 2024-03-23 DIAGNOSIS — I517 Cardiomegaly: Secondary | ICD-10-CM | POA: Diagnosis not present

## 2024-03-23 DIAGNOSIS — D631 Anemia in chronic kidney disease: Secondary | ICD-10-CM | POA: Diagnosis present

## 2024-03-23 DIAGNOSIS — Z801 Family history of malignant neoplasm of trachea, bronchus and lung: Secondary | ICD-10-CM

## 2024-03-23 DIAGNOSIS — R54 Age-related physical debility: Secondary | ICD-10-CM | POA: Diagnosis present

## 2024-03-23 DIAGNOSIS — E538 Deficiency of other specified B group vitamins: Secondary | ICD-10-CM

## 2024-03-23 DIAGNOSIS — W19XXXA Unspecified fall, initial encounter: Secondary | ICD-10-CM | POA: Diagnosis present

## 2024-03-23 DIAGNOSIS — D638 Anemia in other chronic diseases classified elsewhere: Secondary | ICD-10-CM | POA: Diagnosis present

## 2024-03-23 DIAGNOSIS — Z91041 Radiographic dye allergy status: Secondary | ICD-10-CM

## 2024-03-23 DIAGNOSIS — I2721 Secondary pulmonary arterial hypertension: Secondary | ICD-10-CM | POA: Diagnosis present

## 2024-03-23 DIAGNOSIS — Z885 Allergy status to narcotic agent status: Secondary | ICD-10-CM

## 2024-03-23 DIAGNOSIS — R531 Weakness: Secondary | ICD-10-CM | POA: Diagnosis not present

## 2024-03-23 DIAGNOSIS — R64 Cachexia: Secondary | ICD-10-CM | POA: Diagnosis present

## 2024-03-23 DIAGNOSIS — I2729 Other secondary pulmonary hypertension: Secondary | ICD-10-CM | POA: Diagnosis present

## 2024-03-23 DIAGNOSIS — I130 Hypertensive heart and chronic kidney disease with heart failure and stage 1 through stage 4 chronic kidney disease, or unspecified chronic kidney disease: Secondary | ICD-10-CM | POA: Diagnosis present

## 2024-03-23 DIAGNOSIS — M341 CR(E)ST syndrome: Secondary | ICD-10-CM | POA: Diagnosis present

## 2024-03-23 DIAGNOSIS — Z86711 Personal history of pulmonary embolism: Secondary | ICD-10-CM | POA: Diagnosis not present

## 2024-03-23 DIAGNOSIS — I10 Essential (primary) hypertension: Secondary | ICD-10-CM | POA: Diagnosis present

## 2024-03-23 DIAGNOSIS — Z515 Encounter for palliative care: Secondary | ICD-10-CM

## 2024-03-23 DIAGNOSIS — I350 Nonrheumatic aortic (valve) stenosis: Secondary | ICD-10-CM | POA: Diagnosis present

## 2024-03-23 DIAGNOSIS — Z1152 Encounter for screening for COVID-19: Secondary | ICD-10-CM

## 2024-03-23 DIAGNOSIS — Z9181 History of falling: Secondary | ICD-10-CM

## 2024-03-23 DIAGNOSIS — Z681 Body mass index (BMI) 19 or less, adult: Secondary | ICD-10-CM | POA: Diagnosis not present

## 2024-03-23 DIAGNOSIS — R0602 Shortness of breath: Secondary | ICD-10-CM | POA: Diagnosis not present

## 2024-03-23 DIAGNOSIS — Z820 Family history of epilepsy and other diseases of the nervous system: Secondary | ICD-10-CM | POA: Diagnosis not present

## 2024-03-23 DIAGNOSIS — Y929 Unspecified place or not applicable: Secondary | ICD-10-CM

## 2024-03-23 DIAGNOSIS — R636 Underweight: Secondary | ICD-10-CM | POA: Diagnosis present

## 2024-03-23 DIAGNOSIS — Z882 Allergy status to sulfonamides status: Secondary | ICD-10-CM

## 2024-03-23 DIAGNOSIS — I5033 Acute on chronic diastolic (congestive) heart failure: Secondary | ICD-10-CM | POA: Diagnosis present

## 2024-03-23 DIAGNOSIS — Z7189 Other specified counseling: Secondary | ICD-10-CM

## 2024-03-23 DIAGNOSIS — J984 Other disorders of lung: Secondary | ICD-10-CM | POA: Diagnosis present

## 2024-03-23 DIAGNOSIS — I509 Heart failure, unspecified: Secondary | ICD-10-CM | POA: Diagnosis not present

## 2024-03-23 DIAGNOSIS — D649 Anemia, unspecified: Secondary | ICD-10-CM

## 2024-03-23 DIAGNOSIS — Z66 Do not resuscitate: Secondary | ICD-10-CM | POA: Diagnosis present

## 2024-03-23 DIAGNOSIS — Z8042 Family history of malignant neoplasm of prostate: Secondary | ICD-10-CM

## 2024-03-23 DIAGNOSIS — Z800 Family history of malignant neoplasm of digestive organs: Secondary | ICD-10-CM

## 2024-03-23 DIAGNOSIS — I6782 Cerebral ischemia: Secondary | ICD-10-CM | POA: Diagnosis not present

## 2024-03-23 DIAGNOSIS — Z8052 Family history of malignant neoplasm of bladder: Secondary | ICD-10-CM

## 2024-03-23 DIAGNOSIS — M3481 Systemic sclerosis with lung involvement: Secondary | ICD-10-CM | POA: Diagnosis present

## 2024-03-23 LAB — CBC WITH DIFFERENTIAL/PLATELET
Abs Immature Granulocytes: 0.03 10*3/uL (ref 0.00–0.07)
Basophils Absolute: 0 10*3/uL (ref 0.0–0.1)
Basophils Relative: 1 %
Eosinophils Absolute: 0.3 10*3/uL (ref 0.0–0.5)
Eosinophils Relative: 6 %
HCT: 30.2 % — ABNORMAL LOW (ref 36.0–46.0)
Hemoglobin: 9 g/dL — ABNORMAL LOW (ref 12.0–15.0)
Immature Granulocytes: 1 %
Lymphocytes Relative: 13 %
Lymphs Abs: 0.7 10*3/uL (ref 0.7–4.0)
MCH: 35.9 pg — ABNORMAL HIGH (ref 26.0–34.0)
MCHC: 29.8 g/dL — ABNORMAL LOW (ref 30.0–36.0)
MCV: 120.3 fL — ABNORMAL HIGH (ref 80.0–100.0)
Monocytes Absolute: 0.3 10*3/uL (ref 0.1–1.0)
Monocytes Relative: 6 %
Neutro Abs: 4.1 10*3/uL (ref 1.7–7.7)
Neutrophils Relative %: 73 %
Platelets: 123 10*3/uL — ABNORMAL LOW (ref 150–400)
RBC: 2.51 MIL/uL — ABNORMAL LOW (ref 3.87–5.11)
RDW: 12.1 % (ref 11.5–15.5)
WBC: 5.5 10*3/uL (ref 4.0–10.5)
nRBC: 0 % (ref 0.0–0.2)

## 2024-03-23 LAB — CMP (CANCER CENTER ONLY)
ALT: 11 U/L (ref 0–44)
AST: 23 U/L (ref 15–41)
Albumin: 4.3 g/dL (ref 3.5–5.0)
Alkaline Phosphatase: 59 U/L (ref 38–126)
BUN: 72 mg/dL — ABNORMAL HIGH (ref 8–23)
CO2: >45 mmol/L — ABNORMAL HIGH (ref 22–32)
Calcium: 9.6 mg/dL (ref 8.9–10.3)
Chloride: 91 mmol/L — ABNORMAL LOW (ref 98–111)
Creatinine: 1.56 mg/dL — ABNORMAL HIGH (ref 0.44–1.00)
GFR, Estimated: 32 mL/min — ABNORMAL LOW (ref >60)
Glucose, Bld: 99 mg/dL (ref 70–99)
Potassium: 4.7 mmol/L (ref 3.5–5.1)
Sodium: 141 mmol/L (ref 135–145)
Total Bilirubin: 0.6 mg/dL (ref 0.0–1.2)
Total Protein: 7.1 g/dL (ref 6.5–8.1)

## 2024-03-23 LAB — TYPE AND SCREEN
ABO/RH(D): A POS
Antibody Screen: NEGATIVE

## 2024-03-23 LAB — CBC WITH DIFFERENTIAL (CANCER CENTER ONLY)
Abs Immature Granulocytes: 0.03 10*3/uL (ref 0.00–0.07)
Basophils Absolute: 0 10*3/uL (ref 0.0–0.1)
Basophils Relative: 1 %
Eosinophils Absolute: 0.3 10*3/uL (ref 0.0–0.5)
Eosinophils Relative: 6 %
HCT: 28.8 % — ABNORMAL LOW (ref 36.0–46.0)
Hemoglobin: 8.8 g/dL — ABNORMAL LOW (ref 12.0–15.0)
Immature Granulocytes: 1 %
Lymphocytes Relative: 14 %
Lymphs Abs: 0.8 10*3/uL (ref 0.7–4.0)
MCH: 35.5 pg — ABNORMAL HIGH (ref 26.0–34.0)
MCHC: 30.6 g/dL (ref 30.0–36.0)
MCV: 116.1 fL — ABNORMAL HIGH (ref 80.0–100.0)
Monocytes Absolute: 0.3 10*3/uL (ref 0.1–1.0)
Monocytes Relative: 6 %
Neutro Abs: 3.9 10*3/uL (ref 1.7–7.7)
Neutrophils Relative %: 72 %
Platelet Count: 118 10*3/uL — ABNORMAL LOW (ref 150–400)
RBC: 2.48 MIL/uL — ABNORMAL LOW (ref 3.87–5.11)
RDW: 12 % (ref 11.5–15.5)
WBC Count: 5.4 10*3/uL (ref 4.0–10.5)
nRBC: 0 % (ref 0.0–0.2)

## 2024-03-23 LAB — BLOOD GAS, VENOUS
Acid-Base Excess: 19.5 mmol/L — ABNORMAL HIGH (ref 0.0–2.0)
Bicarbonate: 49.7 mmol/L — ABNORMAL HIGH (ref 20.0–28.0)
O2 Saturation: 31.8 %
Patient temperature: 37
pCO2, Ven: 86 mmHg (ref 44–60)
pH, Ven: 7.37 (ref 7.25–7.43)
pO2, Ven: <31 mmHg — CL (ref 32–45)

## 2024-03-23 LAB — MAGNESIUM: Magnesium: 2 mg/dL (ref 1.7–2.4)

## 2024-03-23 LAB — BASIC METABOLIC PANEL WITH GFR
Anion gap: 11 (ref 5–15)
BUN: 71 mg/dL — ABNORMAL HIGH (ref 8–23)
CO2: 39 mmol/L — ABNORMAL HIGH (ref 22–32)
Calcium: 9.4 mg/dL (ref 8.9–10.3)
Chloride: 91 mmol/L — ABNORMAL LOW (ref 98–111)
Creatinine, Ser: 1.43 mg/dL — ABNORMAL HIGH (ref 0.44–1.00)
GFR, Estimated: 36 mL/min — ABNORMAL LOW (ref >60)
Glucose, Bld: 99 mg/dL (ref 70–99)
Potassium: 4.3 mmol/L (ref 3.5–5.1)
Sodium: 141 mmol/L (ref 135–145)

## 2024-03-23 LAB — SEDIMENTATION RATE: Sed Rate: 22 mm/h (ref 0–22)

## 2024-03-23 LAB — BRAIN NATRIURETIC PEPTIDE: B Natriuretic Peptide: 2711.9 pg/mL — ABNORMAL HIGH (ref 0.0–100.0)

## 2024-03-23 LAB — RESP PANEL BY RT-PCR (RSV, FLU A&B, COVID)  RVPGX2
Influenza A by PCR: NEGATIVE
Influenza B by PCR: NEGATIVE
Resp Syncytial Virus by PCR: NEGATIVE
SARS Coronavirus 2 by RT PCR: NEGATIVE

## 2024-03-23 LAB — VITAMIN B12: Vitamin B-12: 896 pg/mL (ref 180–914)

## 2024-03-23 LAB — TSH: TSH: 5.953 u[IU]/mL — ABNORMAL HIGH (ref 0.350–4.500)

## 2024-03-23 MED ORDER — ENOXAPARIN SODIUM 30 MG/0.3ML IJ SOSY
30.0000 mg | PREFILLED_SYRINGE | INTRAMUSCULAR | Status: DC
  Administered 2024-03-23 – 2024-03-25 (×3): 30 mg via SUBCUTANEOUS
  Filled 2024-03-23 (×3): qty 0.3

## 2024-03-23 MED ORDER — FUROSEMIDE 10 MG/ML IJ SOLN: 40.0000 mg | Freq: Once | INTRAMUSCULAR | Status: DC

## 2024-03-23 MED ORDER — ACETAMINOPHEN 325 MG PO TABS: 650.0000 mg | ORAL_TABLET | Freq: Four times a day (QID) | ORAL | Status: DC | PRN

## 2024-03-23 MED ORDER — PANTOPRAZOLE SODIUM 40 MG IV SOLR
40.0000 mg | Freq: Two times a day (BID) | INTRAVENOUS | Status: DC
  Administered 2024-03-23 – 2024-03-26 (×6): 40 mg via INTRAVENOUS
  Filled 2024-03-23 (×6): qty 10

## 2024-03-23 MED ORDER — FUROSEMIDE 10 MG/ML IJ SOLN
20.0000 mg | Freq: Once | INTRAMUSCULAR | Status: AC
  Administered 2024-03-23: 20 mg via INTRAVENOUS
  Filled 2024-03-23: qty 4

## 2024-03-23 MED ORDER — HYDRALAZINE HCL 20 MG/ML IJ SOLN
5.0000 mg | Freq: Three times a day (TID) | INTRAMUSCULAR | Status: DC | PRN
  Administered 2024-03-24: 5 mg via INTRAVENOUS
  Filled 2024-03-23: qty 1

## 2024-03-23 MED ORDER — ONDANSETRON HCL 4 MG PO TABS: 4.0000 mg | ORAL_TABLET | Freq: Four times a day (QID) | ORAL | Status: DC | PRN

## 2024-03-23 MED ORDER — CYANOCOBALAMIN 1000 MCG/ML IJ SOLN
1000.0000 ug | Freq: Once | INTRAMUSCULAR | Status: AC
  Administered 2024-03-23: 1000 ug via INTRAMUSCULAR
  Filled 2024-03-23: qty 1

## 2024-03-23 MED ORDER — ACETAMINOPHEN 650 MG RE SUPP: 650.0000 mg | Freq: Four times a day (QID) | RECTAL | Status: DC | PRN

## 2024-03-23 MED ORDER — ONDANSETRON HCL 4 MG/2ML IJ SOLN: 4.0000 mg | Freq: Four times a day (QID) | INTRAMUSCULAR | Status: DC | PRN

## 2024-03-23 NOTE — Assessment & Plan Note (Addendum)
 88 year old female presenting to the hospital with 3 week history of shortness of breath, confusion, increased drowsiness, garbled speech who was sent from oncology due to elevated CO2 on  lab finding found to have chronic respiratory failure with hypercapnia  -obs to stepdown -placed on bipap  -vbg with pCO2 of 86, but normal pH -she has end stage lung disease on 6L oxygen . Was on 3L back in April.  Followed by pulm/cardiology. Pulmonology has discussed no further intervention. Discussed hospice in January appointment.  -palliative care consult placed -continue brovana  neb BID  -bipap and repeat vbg  -dose of IV lasix  -check inflammatory markers  -will discuss with pulm to make sure no further intervention warranted

## 2024-03-23 NOTE — H&P (Addendum)
 History and Physical    Patient: Paula Ward ZOX:[id number] DOB: 05/25/1936 DOA: 03/23/2024 DOS: the patient was seen and examined on 03/23/2024 PCP: Adelia Homestead, MD  Patient coming from: Home - lives with her husband   Chief Complaint: abnormal labs   HPI: [name] is a 88 y.o. female with medical history significant of pulmonary HTN, ILD, restrictive lung disease due to CTD/scleroderma, diastolic CHF, CKD stage 3b, aortic stenosis, chronic respiratory failure on 6L [place],  hx of RCC and breast cancer, Hx of pericardial effusion felt to be chronic who presented to ED with abnormal labs.  She was seen at cancer center and they sent her to ED for further work up due to low hemoglobin and elevated CO2.   Family reports she has had increasing confusion, drowsiness, falling asleep on the telephone, garbled speech over the last 3 weeks. Daughter also states she has gained 7 pounds from her baseline. She also fell on Monday and hit the back of her head.   ROS can not be obtained.   ER Course:  vitals: afebrile, bp: 163/57, H:R 71, RR: 22, oxygen : 97%[place] Pertinent labs: hgb: 8.8, MCV: 116, platelet count: 118, CO2>45, BUN: 72, creatinine: 1.56,  CXR: CHF, cardiac enlargement with large pericardial effusion. Unchanged since 12/04/2023.  In ED: [facility] asked to admit.    Review of Systems: Can not be obtained.  Past Medical History:  Diagnosis Date   Anemia    Angiodysplasia of stomach and duodenum    Aortic stenosis    Arthus phenomenon    AVM (arteriovenous malformation)    Blood transfusion without reported diagnosis    Breast cancer (HCC) 1989   Left   Candida esophagitis (HCC)    Cataract    Chronic respiratory failure (HCC)    CKD stage 3b, GFR 30-44 ml/min (HCC)    Corneal edema    Corneal epithelial basement membrane dystrophy    CREST syndrome (HCC)    GERD (gastroesophageal reflux disease)    w/ HH   Interstitial lung disease (HCC)    Nodule of kidney     Pericardial effusion    PONV (postoperative nausea and vomiting)    Pulmonary embolus (HCC) 2003   Pulmonary hypertension (HCC)    Rectal incontinence    Renal cell carcinoma (HCC)    Scleroderma (HCC)    Tubular adenoma of colon    Uterine polyp    Past Surgical History:  Procedure Laterality Date   APPENDECTOMY  1962   BREAST LUMPECTOMY  1989   left   CATARACT EXTRACTION, BILATERAL Bilateral 12/2013   COLONOSCOPY WITH PROPOFOL  N/A 04/15/2021   Procedure: COLONOSCOPY WITH PROPOFOL ;  Surgeon: Janel Medford, MD;  Location: [facility];  Service: Endoscopy;  Laterality: N/A;   ENTEROSCOPY N/A 01/18/2016   Procedure: ENTEROSCOPY;  Surgeon: Sergio Dandy, MD;  Location: [facility];  Service: Endoscopy;  Laterality: N/A;   ENTEROSCOPY N/A 05/24/2018   Procedure: ENTEROSCOPY;  Surgeon: Lajuan Pila, MD;  Location: [facility];  Service: Endoscopy;  Laterality: N/A;   ENTEROSCOPY N/A 03/29/2021   Procedure: ENTEROSCOPY;  Surgeon: Nannette Babe, MD;  Location: [facility];  Service: Gastroenterology;  Laterality: N/A;   ENTEROSCOPY N/A 04/13/2021   Procedure: ENTEROSCOPY;  Surgeon: Ace Holder, MD;  Location: [facility];  Service: Gastroenterology;  Laterality: N/A;   ESOPHAGOGASTRODUODENOSCOPY (EGD) WITH PROPOFOL  N/A 12/21/2015   Procedure: ESOPHAGOGASTRODUODENOSCOPY (EGD) WITH PROPOFOL ;  Surgeon: Tobin Forts, MD;  Location: [facility];  Service: Endoscopy;  Laterality: N/A;   HOT HEMOSTASIS N/A 05/24/2018   Procedure: HOT HEMOSTASIS (ARGON PLASMA COAGULATION/BICAP);  Surgeon: Lajuan Pila, MD;  Location: [facility];  Service: Endoscopy;  Laterality: N/A;   HOT HEMOSTASIS N/A 03/29/2021   Procedure: HOT HEMOSTASIS (ARGON PLASMA COAGULATION/BICAP);  Surgeon: Nannette Babe, MD;  Location: [facility];  Service: Gastroenterology;  Laterality: N/A;   HOT HEMOSTASIS N/A 04/13/2021   Procedure: HOT HEMOSTASIS (ARGON PLASMA COAGULATION/BICAP);  Surgeon: Ace Holder, MD;   Location: [facility];  Service: Gastroenterology;  Laterality: N/A;   INTRAMEDULLARY (IM) NAIL INTERTROCHANTERIC Left 10/11/2022   Procedure: INTRAMEDULLARY (IM) NAIL INTERTROCHANTERIC;  Surgeon: Liliane Rei, MD;  Location: [facility];  Service: Orthopedics;  Laterality: Left;   IR GENERIC HISTORICAL  06/05/2016   IR RADIOLOGIST EVAL & MGMT 06/05/2016 Erica Hau, MD [facility] INTERV RAD   IVC Filter     KIDNEY SURGERY     left -"laser surgery by Dr. Nereida Banning- 5 yrs ago" no removal   RIGHT/LEFT HEART CATH AND CORONARY ANGIOGRAPHY N/A 04/25/2022   Procedure: RIGHT/LEFT HEART CATH AND CORONARY ANGIOGRAPHY;  Surgeon: Arnoldo Lapping, MD;  [facility] INVASIVE CV LAB;  Service: Cardiovascular;  Laterality: N/A;   SCHLEROTHERAPY  05/24/2018   Procedure: [place];  Surgeon: Lajuan Pila, MD;  Location: [facility];  Service: Endoscopy;;   SUBMUCOSAL TATTOO INJECTION  04/13/2021   Procedure: SUBMUCOSAL TATTOO INJECTION;  Surgeon: Ace Holder, MD;  Location: [facility];  Service: Gastroenterology;;   TONSILLECTOMY     TUBAL LIGATION     Social History:  reports that she has never smoked. She has never used smokeless tobacco. She reports that she does not drink alcohol and does not use drugs.  Allergies  Allergen Reactions   Codeine  Nausea Only    Hallucinations, too   Other Nausea And Vomiting and Other (See Comments)    "-mycin" antibiotics.   Also cause hallucinations.   Erythromycin Nausea And Vomiting   Iodinated Contrast Media Other (See Comments)    Renal issues   Lisinopril Other (See Comments)    Pt doesn't remember reaction   Mirtazapine  Other (See Comments)    "Threw me into orbit"   Mycophenolate Mofetil Nausea Only   Sulfa  Antibiotics Other (See Comments)    Unknown reaction    Family History  Problem Relation Age of Onset   Bladder Cancer Father    Diabetes Father    Prostate cancer Father    Alzheimer's disease Mother    Diabetes Sister    Lung cancer  Sister         smoker   Esophageal cancer Paternal Uncle    Colon cancer Neg Hx     Prior to Admission medications   Medication Sig Start Date End Date Taking? Authorizing Provider  acetaminophen  (TYLENOL ) 325 MG tablet Take 2 tablets (650 mg total) by mouth every 6 (six) hours as needed for moderate pain. 07/18/22   Adolph Hoop, PA-C  albuterol  (PROVENTIL ) (2.5 MG/3ML) 0.083% nebulizer solution Take 3 mLs (2.5 mg total) by nebulization every 6 (six) hours as needed for wheezing or shortness of breath. 09/03/23   Pokhrel, Amador Bad, MD  albuterol  (VENTOLIN  HFA) 108 (90 Base) MCG/ACT inhaler INHALE 2 PUFFS INTO THE LUNGS EVERY 6 HOURS AS NEEDED FOR WHEEZING OR SHORTNESS OF BREATH Patient taking differently: Inhale 2 puffs into the lungs every 6 (six) hours as needed for wheezing or shortness of breath. 03/13/23   Adelia Homestead, MD  arformoterol  (BROVANA ) 15  MCG/2ML NEBU Take 2 mLs (15 mcg total) by nebulization 2 (two) times daily. 11/11/23   Hunsucker, Archer Kobs, MD  augmented betamethasone dipropionate (DIPROLENE-AF) 0.05 % cream Apply 1 application  topically 2 (two) times daily as needed (for irritation). Patient not taking: Reported on 03/16/2024 07/13/23   [provider]  clobetasol ointment (TEMOVATE) 0.05 % Apply 1 Application topically 2 (two) times daily as needed (for irritation). Patient not taking: Reported on 03/16/2024 04/20/23   [provider]  diltiazem  (CARDIZEM ) 30 MG tablet Take 1 tablet (30 mg total) by mouth every 12 (twelve) hours. 12/04/23   Carie Charity, NP  docusate sodium  (COLACE) 100 MG capsule Take 1 capsule (100 mg total) by mouth 2 (two) times daily. Patient taking differently: Take 100 mg by mouth in the morning. 10/15/22   Edmisten, Kristie L, PA  empagliflozin  (JARDIANCE ) 10 MG TABS tablet Take 1 tablet (10 mg total) by mouth daily. Patient not taking: Reported on 03/16/2024 08/16/23   Faith Homes, MD  famotidine  (PEPCID ) 20 MG tablet TAKE  1 TABLET BY MOUTH AT BEDTIME 02/16/24   Nandigam, Kavitha V, MD  fluticasone  (FLONASE ) 50 MCG/ACT nasal spray USE 1 SPRAY(S) IN EACH NOSTRIL ONCE DAILY AS NEEDED FOR ALLERGIES 12/04/23   Adelia Homestead, MD  furosemide  (LASIX ) 20 MG tablet Take 2 tablets ( 40 mg ) every morning may take extra 2 tablets for weight gain of 3 lbs 02/19/24   Lenise Quince, MD  guaiFENesin  (MUCINEX ) 600 MG 12 hr tablet Take 600 mg by mouth 2 (two) times daily as needed.    [provider]  ipratropium (ATROVENT ) 0.03 % nasal spray Place 2 sprays into both nostrils every 12 (twelve) hours. Patient taking differently: Place 2 sprays into both nostrils at bedtime. 05/12/22   Wilfredo Hanly, MD  latanoprost  (XALATAN ) 0.005 % ophthalmic solution Place 1 drop into both eyes at bedtime. 09/23/22   [provider]  magic mouthwash SOLN Take 10 mLs by mouth 4 (four) times daily as needed for mouth pain. 03/22/22   Adhikari, Amrit, MD  mupirocin  ointment (BACTROBAN ) 2 % Apply 1 Application topically 2 (two) times daily as needed (for irritation). Patient not taking: Reported on 03/16/2024    [provider]  Nutritional Supplements (ENSURE HIGH PROTEIN) LIQD Take 0.5 Bottles by mouth daily as needed (for supplementation).    [provider]  oseltamivir  (TAMIFLU ) 30 MG capsule Take 1 capsule (30 mg total) by mouth daily. Patient not taking: Reported on 03/16/2024 01/22/24   Zorita Hiss, NP  OXYGEN  Inhale 2-3 L/min into the lungs continuous.    [provider]  pantoprazole  (PROTONIX ) 40 MG tablet TAKE 1 TABLET BY MOUTH TWICE DAILY BEFORE A MEAL 10/19/23   Nandigam, Kavitha V, MD  polyethylene glycol (MIRALAX  / GLYCOLAX ) 17 g packet Take 17 g by mouth daily. Patient not taking: Reported on 03/16/2024 10/15/22   Edmisten, Gillian Lacrosse L, PA  revefenacin  (YUPELRI ) 175 MCG/3ML nebulizer solution Take 3 mLs (175 mcg total) by nebulization daily. Patient not taking: Reported on 03/16/2024  11/11/23   Hunsucker, Archer Kobs, MD  spironolactone  (ALDACTONE ) 25 MG tablet Take 1/2 tablet ( 12.5 mg ) every morning 02/18/24   Lenise Quince, MD  sucralfate  (CARAFATE ) 1 GM/10ML suspension TAKE  10 ML BY MOUTH EVERY 6 HOURS AS NEEDED FOR REFLUX 09/25/23   Nandigam, Kavitha V, MD  triamcinolone  cream (KENALOG ) 0.1 % Apply 1 Application topically See admin instructions. Apply to affected  leg(s) after showers and up to 2 additional times a day as needed for irritation 07/07/23   [provider]    Physical Exam: Vitals:   03/23/24 1654 03/23/24 1655 03/23/24 1855 03/23/24 1900  BP: (!) 163/57   (!) 149/45  Pulse: 71  71 76  Resp: (!) 22  20 (!) 33  Temp: 97.6 F (36.4 C)     TempSrc: Oral     SpO2: 97%  100% 96%  Weight:  48 kg    Height:  5\' 3"  (1.6 m)     General:  Appears calm and comfortable and is in NAD. Sleeping, cachetic. Arouses, but goes back to sleep. Bipap on  Eyes:  PERRL, EOMI, normal lids, iris ENT:  grossly normal hearing, lips & tongue, mmm; appropriate dentition Neck:  no LAD, masses or thyromegaly; no carotid bruits Cardiovascular:  RRR, 3/6 systolic murmur. trace LE edema.  Respiratory:   decreased air movement, but no wheezing/rales.  Abdomen:  soft, NT, ND, NABS Back:   normal alignment, no CVAT Skin:  no rash or induration seen on limited exam Musculoskeletal:  grossly normal tone BUE/BLE, good ROM, no bony abnormality Lower extremity:  No LE edema.  Limited foot exam with no ulcerations.  2+ distal pulses. Psychiatric:  sleeping. Arouses and answers questions.  Neurologic:  CN 2-12 grossly intact, moves all extremities in coordinated fashion, sensation intact   Radiological Exams on Admission: Independently reviewed - see discussion in A/P where applicable  DG Chest Port 1 View Result Date: 03/23/2024 CLINICAL DATA:  Shortness of breath. EXAM: PORTABLE CHEST 1 VIEW COMPARISON:  12/04/2023 and CT chest 04/23/2022. FINDINGS: Trachea is midline.  Globular and enlarged cardiopericardial silhouette, as on 12/04/2023. New mixed interstitial and airspace opacification. Left pleural effusion. IMPRESSION: 1. Congestive heart failure. 2. Cardiac enlargement with a large pericardial effusion, unchanged from 12/04/2023. Electronically Signed   By: Shearon Denis M.D.   On: 03/23/2024 17:58    EKG: Independently reviewed.  NSR with rate 80; nonspecific ST changes with no evidence of acute ischemia. PAC    Labs on Admission: I have personally reviewed the available labs and imaging studies at the time of the admission.  Pertinent labs:   hgb: 8.8>9.0 MCV: 116,  platelet count: 118,  CO2>45,  BUN: 72,  creatinine: 1.56  Assessment and Plan: Principal Problem:   Acute on chronic respiratory failure with hypoxia and hypercapnia secondary to ILD/restrictive lung disease from CREST and scleroderma and PAH Active Problems:   Acute metabolic encephalopathy   Anemia, chronic disease   Chronic diastolic heart failure (HCC)   Chronic kidney disease, stage 3b (HCC)   Chronic pericardial effusion   Essential hypertension   GASTROESOPHAGEAL REFLUX DISEASE   Other secondary pulmonary hypertension (HCC)   ILD (interstitial lung disease) (HCC)   Nonrheumatic aortic valve stenosis    Assessment and Plan: * Acute on chronic respiratory failure with hypoxia and hypercapnia secondary to ILD/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 Was on 3L back in April.  Followed by pulm/cardiology. Pulmonology has discussed no further intervention. Discussed hospice in  January appointment.  -palliative care consult placed -continue brovana  neb BID  -bipap and repeat vbg  -dose of IV lasix  -check inflammatory markers  -will discuss with pulm to make sure no further intervention warranted  Acute metabolic encephalopathy Likely secondary to #1, but daughter reports she fell on Monday and hit her head -check head CT -continue bipap for hypercapnia -no signs of infectious cause -B12 wnl today -check TSH and UA   Anemia, chronic disease -hgb stable at 9 -no indication for transfusion -continue to trend   Chronic diastolic heart failure (HCC) Appears slightly overloaded with weight gain and mild le edema Family states 7 pounds overweight  CXR with stable pericardial effusion and left pleural effusion, stable  -give one dose of IV lasix  today -continue strict I/O and daily weights  -echo: 11/2023: EF of 70-75%, grade 1 DD. Moderate aortic stenosis -continue medical management when off bipap    Chronic kidney disease, stage 3b (HCC) Creatinine at baseline  Continue to monitor   Chronic pericardial effusion Stable on recent echo   Essential hypertension PRN IV medication while NPO on bipap  Continue oral home meds when off bipap   GASTROESOPHAGEAL REFLUX DISEASE Change oral PPI to IV     Advance Care Planning:   Code Status: Limited: Do not attempt resuscitation (DNR) -DNR-LIMITED -Do Not Intubate/DNI    Consults: palliative care   DVT Prophylaxis: lovenox    Family Communication: updated daughter, Bernerd Bright, [place]   Severity of Illness: The appropriate patient status for this patient is OBSERVATION. Observation status is judged to be reasonable and necessary in order to provide the required intensity of service to ensure the patient's safety. The patient's presenting symptoms, physical exam findings, and initial radiographic and laboratory data in the context of their medical condition is felt to place them at decreased risk for further  clinical deterioration. Furthermore, it is anticipated that the patient will be medically stable for discharge from the hospital within 2 midnights of admission.   Author: Raymona Caldwell, MD 03/23/2024 9:05 PM  For on call review [email or web address].

## 2024-03-23 NOTE — Assessment & Plan Note (Signed)
 PRN IV medication while NPO on bipap  Continue oral home meds when off bipap

## 2024-03-23 NOTE — ED Triage Notes (Addendum)
 Patient has had increased shortness of breath for 3 weeks. Uses 6L [place] baseline. Feels increased weakness and fatigue. Had blood work drawn today and was told her hemoglobin was 8. Denies bleeding anywhere.

## 2024-03-23 NOTE — Assessment & Plan Note (Signed)
Stable on recent echo.

## 2024-03-23 NOTE — Progress Notes (Signed)
 Patient here for B12 injection.  C/O dizziness, decreased appetie.  Wondering if she needs fluids?  Denies any nausea or vomiting.  Wants to know Ultra sound results.  Secure chatted Irene/PA.  Irene/PA states Ultra Sounds shows nonspecific without any definite evidence of cirrhosis.  Plan to add Retracrit once labs are resulted.  Patient made aware.

## 2024-03-23 NOTE — ED Provider Notes (Signed)
 [place] EMERGENCY DEPARTMENT AT [facility] Provider Note   CSN: [id number] Arrival date & time: 03/23/24  1650     History  Chief Complaint  Patient presents with   Shortness of Breath    Paula Ward is a 88 y.o. female.  88 yo F with a chief complaints of difficulty breathing.  She said this been going on for some time.  She has not really noticed a significant change.  It is a bit worse when she gets up and tries to move around.  She went to see her oncologist today and she had blood work obtained she was told that her labs were abnormal and she needed to be admitted to the hospital.  She tells me that when her hemoglobin gets into 8 she usually gets blood transfusion.   Shortness of Breath      Home Medications Prior to Admission medications   Medication Sig Start Date End Date Taking? Authorizing Provider  acetaminophen  (TYLENOL ) 325 MG tablet Take 2 tablets (650 mg total) by mouth every 6 (six) hours as needed for moderate pain. 07/18/22   Adolph Hoop, PA-C  albuterol  (PROVENTIL ) (2.5 MG/3ML) 0.083% nebulizer solution Take 3 mLs (2.5 mg total) by nebulization every 6 (six) hours as needed for wheezing or shortness of breath. 09/03/23   Pokhrel, Amador Bad, MD  albuterol  (VENTOLIN  HFA) 108 (90 Base) MCG/ACT inhaler INHALE 2 PUFFS INTO THE LUNGS EVERY 6 HOURS AS NEEDED FOR WHEEZING OR SHORTNESS OF BREATH Patient taking differently: Inhale 2 puffs into the lungs every 6 (six) hours as needed for wheezing or shortness of breath. 03/13/23   Adelia Homestead, MD  arformoterol  (BROVANA ) 15 MCG/2ML NEBU Take 2 mLs (15 mcg total) by nebulization 2 (two) times daily. 11/11/23   Hunsucker, Archer Kobs, MD  augmented betamethasone dipropionate (DIPROLENE-AF) 0.05 % cream Apply 1 application  topically 2 (two) times daily as needed (for irritation). Patient not taking: Reported on 03/16/2024 07/13/23   [provider]  clobetasol ointment (TEMOVATE) 0.05 % Apply 1  Application topically 2 (two) times daily as needed (for irritation). Patient not taking: Reported on 03/16/2024 04/20/23   [provider]  diltiazem  (CARDIZEM ) 30 MG tablet Take 1 tablet (30 mg total) by mouth every 12 (twelve) hours. 12/04/23   Carie Charity, NP  docusate sodium  (COLACE) 100 MG capsule Take 1 capsule (100 mg total) by mouth 2 (two) times daily. Patient taking differently: Take 100 mg by mouth in the morning. 10/15/22   Edmisten, Kristie L, PA  empagliflozin  (JARDIANCE ) 10 MG TABS tablet Take 1 tablet (10 mg total) by mouth daily. Patient not taking: Reported on 03/16/2024 08/16/23   Faith Homes, MD  famotidine  (PEPCID ) 20 MG tablet TAKE 1 TABLET BY MOUTH AT BEDTIME 02/16/24   Nandigam, Kavitha V, MD  fluticasone  (FLONASE ) 50 MCG/ACT nasal spray USE 1 SPRAY(S) IN EACH NOSTRIL ONCE DAILY AS NEEDED FOR ALLERGIES 12/04/23   Adelia Homestead, MD  furosemide  (LASIX ) 20 MG tablet Take 2 tablets ( 40 mg ) every morning may take extra 2 tablets for weight gain of 3 lbs 02/19/24   Lenise Quince, MD  guaiFENesin  (MUCINEX ) 600 MG 12 hr tablet Take 600 mg by mouth 2 (two) times daily as needed.    [provider]  ipratropium (ATROVENT ) 0.03 % nasal spray Place 2 sprays into both nostrils every 12 (twelve) hours. Patient taking differently: Place 2 sprays into both nostrils at bedtime. 05/12/22   Dewald,  Francyne Islam, MD  latanoprost  (XALATAN ) 0.005 % ophthalmic solution Place 1 drop into both eyes at bedtime. 09/23/22   [provider]  magic mouthwash SOLN Take 10 mLs by mouth 4 (four) times daily as needed for mouth pain. 03/22/22   Adhikari, Amrit, MD  mupirocin  ointment (BACTROBAN ) 2 % Apply 1 Application topically 2 (two) times daily as needed (for irritation). Patient not taking: Reported on 03/16/2024    [provider]  Nutritional Supplements (ENSURE HIGH PROTEIN) LIQD Take 0.5 Bottles by mouth daily as needed (for supplementation).    [provider]  oseltamivir  (TAMIFLU ) 30 MG capsule Take 1 capsule (30 mg total) by mouth daily. Patient not taking: Reported on 03/16/2024 01/22/24   Zorita Hiss, NP  OXYGEN  Inhale 2-3 L/min into the lungs continuous.    [provider]  pantoprazole  (PROTONIX ) 40 MG tablet TAKE 1 TABLET BY MOUTH TWICE DAILY BEFORE A MEAL 10/19/23   Nandigam, Kavitha V, MD  polyethylene glycol (MIRALAX  / GLYCOLAX ) 17 g packet Take 17 g by mouth daily. Patient not taking: Reported on 03/16/2024 10/15/22   Dolphus Friday, PA  revefenacin  (YUPELRI ) 175 MCG/3ML nebulizer solution Take 3 mLs (175 mcg total) by nebulization daily. Patient not taking: Reported on 03/16/2024 11/11/23   Hunsucker, Archer Kobs, MD  spironolactone  (ALDACTONE ) 25 MG tablet Take 1/2 tablet ( 12.5 mg ) every morning 02/18/24   Lenise Quince, MD  sucralfate  (CARAFATE ) 1 GM/10ML suspension TAKE  10 ML BY MOUTH EVERY 6 HOURS AS NEEDED FOR REFLUX 09/25/23   Nandigam, Kavitha V, MD  triamcinolone  cream (KENALOG ) 0.1 % Apply 1 Application topically See admin instructions. Apply to affected leg(s) after showers and up to 2 additional times a day as needed for irritation 07/07/23   [provider]      Allergies    Codeine , Other, Erythromycin, Iodinated contrast media, Lisinopril, Mirtazapine , Mycophenolate mofetil, and Sulfa  antibiotics    Review of Systems   Review of Systems  Respiratory:  Positive for shortness of breath.     Physical Exam Updated Vital Signs BP (!) 163/57   Pulse 71   Temp 97.6 F (36.4 C) (Oral)   Resp (!) 22   Ht 5\' 3"  (1.6 m)   Wt 48 kg   SpO2 97%   BMI 18.75 kg/m  Physical Exam Vitals and nursing note reviewed.  Constitutional:      General: She is not in acute distress.    Appearance: She is well-developed. She is not diaphoretic.     Comments: Chronically ill-appearing.  HENT:     Head: Normocephalic and atraumatic.  Eyes:     Pupils: Pupils are equal, round, and reactive to light.   Cardiovascular:     Rate and Rhythm: Normal rate and regular rhythm.     Heart sounds: No murmur heard.    No friction rub. No gallop.  Pulmonary:     Effort: Pulmonary effort is normal.     Breath sounds: No wheezing or rales.     Comments: Kyphotic.  Diminished breath sounds in all fields. Abdominal:     General: There is no distension.     Palpations: Abdomen is soft.     Tenderness: There is no abdominal tenderness.  Musculoskeletal:        General: No tenderness.     Cervical back: Normal range of motion and neck supple.  Skin:    General: Skin is warm and dry.  Neurological:  Mental Status: She is alert and oriented to person, place, and time.  Psychiatric:        Behavior: Behavior normal.     ED Results / Procedures / Treatments   Labs (all labs ordered are listed, but only abnormal results are displayed) Labs Reviewed  BLOOD GAS, VENOUS - Abnormal; Notable for the following components:      Result Value   pCO2, Ven 86 (*)    pO2, Ven <31 (*)    Bicarbonate 49.7 (*)    Acid-Base Excess 19.5 (*)    All other components within normal limits  CBC WITH DIFFERENTIAL/PLATELET - Abnormal; Notable for the following components:   RBC 2.51 (*)    Hemoglobin 9.0 (*)    HCT 30.2 (*)    MCV 120.3 (*)    MCH 35.9 (*)    MCHC 29.8 (*)    Platelets 123 (*)    All other components within normal limits  BASIC METABOLIC PANEL WITH GFR - Abnormal; Notable for the following components:   Chloride 91 (*)    CO2 39 (*)    BUN 71 (*)    Creatinine, Ser 1.43 (*)    GFR, Estimated 36 (*)    All other components within normal limits  RESP PANEL BY RT-PCR (RSV, FLU A&B, COVID)  [id number]  TYPE AND SCREEN    EKG None  Radiology DG Chest Port 1 View Result Date: 03/23/2024 CLINICAL DATA:  Shortness of breath. EXAM: PORTABLE CHEST 1 VIEW COMPARISON:  12/04/2023 and CT chest 04/23/2022. FINDINGS: Trachea is midline. Globular and enlarged cardiopericardial silhouette, as on  12/04/2023. New mixed interstitial and airspace opacification. Left pleural effusion. IMPRESSION: 1. Congestive heart failure. 2. Cardiac enlargement with a large pericardial effusion, unchanged from 12/04/2023. Electronically Signed   By: Shearon Denis M.D.   On: 03/23/2024 17:58    Procedures .Critical Care  Performed by: Albertus Hughs, DO Authorized by: Albertus Hughs, DO   Critical care provider statement:    Critical care time (minutes):  35   Critical care time was exclusive of:  Separately billable procedures and treating other patients   Critical care was time spent personally by me on the following activities:  Development of treatment plan with patient or surrogate, discussions with consultants, evaluation of patient's response to treatment, examination of patient, ordering and review of laboratory studies, ordering and review of radiographic studies, ordering and performing treatments and interventions, pulse oximetry, re-evaluation of patient's condition and review of old charts   Care discussed with: admitting provider       Medications Ordered in ED Medications - No data to display  ED Course/ Medical Decision Making/ A&P                                 Medical Decision Making Amount and/or Complexity of Data Reviewed Labs: ordered. Radiology: ordered.  Risk Decision regarding hospitalization.   88 yo F with a chief complaints of difficulty breathing.  She says has been going on for a long time now.  She says it is worse when she gets up and tries to move around.  She saw her oncologist in clinic today and was told that her labs were abnormal and she needed to be admitted to the hospital.  I was notified by nursing that Dr. Rosaline Coma had called nursing and said that this patient was to be admitted.  The patient's blood work to  me is not significantly changed from baseline.  She denies any significant change to her work of breathing.  She has a chronic metabolic alkalosis that  is perhaps mildly worse we will obtain a VBG here.  Her hemoglobin is also very close to what her baseline seems to be on my lab checks though she tells me when she gets down below 9 she typically gets a blood transfusion.  Patient's pCO2 is 89.  Will place on BiPAP.  Will discuss with medicine.   The patients results and plan were reviewed and discussed.   Any x-rays performed were independently reviewed by myself.   Differential diagnosis were considered with the presenting HPI.  Medications - No data to display  Vitals:   03/23/24 1654 03/23/24 1655  BP: (!) 163/57   Pulse: 71   Resp: (!) 22   Temp: 97.6 F (36.4 C)   TempSrc: Oral   SpO2: 97%   Weight:  48 kg  Height:  5\' 3"  (1.6 m)    Final diagnoses:  Acute respiratory failure with hypoxia and hypercarbia (HCC)    Admission/ observation were discussed with the admitting physician, patient and/or family and they are comfortable with the plan.          Final Clinical Impression(s) / ED Diagnoses Final diagnoses:  Acute respiratory failure with hypoxia and hypercarbia [facility])    Rx / DC Orders ED Discharge Orders     None         Albertus Hughs, DO 03/23/24 1610

## 2024-03-23 NOTE — Telephone Encounter (Signed)
 Left message for pt to call.

## 2024-03-23 NOTE — Progress Notes (Signed)
 Patient transported from ED room to CT scan and back to [facility].  Patient remained stable and comfortable throughout the trip.

## 2024-03-23 NOTE — Assessment & Plan Note (Signed)
-  hgb stable at 9 -no indication for transfusion -continue to trend

## 2024-03-23 NOTE — Assessment & Plan Note (Signed)
Change oral PPI to IV  ?

## 2024-03-23 NOTE — Progress Notes (Signed)
 Pt continued to feel poorly. C/o dizzyness, weakness. HGB @ 8.8, C02 >45. Per Dr. Pervis Branch transferred to ED for eval and transfusion. R/o UTI, URI Pt aware and agrees to be transported to ED. Pt transported to ED via w/c Husband was being taken back home by caregiver. Taken to room 9 in the ED. Report given to Cleatrice Curl, RN

## 2024-03-23 NOTE — Assessment & Plan Note (Signed)
 Creatinine at baseline ?? Continue to monitor  ?

## 2024-03-23 NOTE — Telephone Encounter (Signed)
 Spoke with patient and caregiver, they were at appointment. During that appointment they were told her blood work was off and she needed to be admitted to the hosptial. Caregiver stated patient has not been acking herself increase forgetfulness, sleepiness, and confusion. They need to cancel appointment for AWV tomorrow since they will be in the hospital and will get it rescheduled once she is better.

## 2024-03-23 NOTE — Assessment & Plan Note (Addendum)
 Appears slightly overloaded with weight gain and mild le edema Family states 7 pounds overweight  CXR with stable pericardial effusion and left pleural effusion, stable  -give one dose of IV lasix  today -continue strict I/O and daily weights  -echo: 11/2023: EF of 70-75%, grade 1 DD. Moderate aortic stenosis -continue medical management when off bipap

## 2024-03-23 NOTE — ED Notes (Signed)
 Pt headed to CT with RT at bedside.

## 2024-03-23 NOTE — Assessment & Plan Note (Signed)
 Likely secondary to #1, but daughter reports she fell on Monday and hit her head -check head CT -continue bipap for hypercapnia -no signs of infectious cause -B12 wnl today -check TSH and UA

## 2024-03-24 ENCOUNTER — Ambulatory Visit

## 2024-03-24 DIAGNOSIS — Z515 Encounter for palliative care: Secondary | ICD-10-CM | POA: Diagnosis not present

## 2024-03-24 DIAGNOSIS — I2729 Other secondary pulmonary hypertension: Secondary | ICD-10-CM

## 2024-03-24 DIAGNOSIS — J9601 Acute respiratory failure with hypoxia: Principal | ICD-10-CM

## 2024-03-24 DIAGNOSIS — Z79899 Other long term (current) drug therapy: Secondary | ICD-10-CM

## 2024-03-24 DIAGNOSIS — R64 Cachexia: Secondary | ICD-10-CM | POA: Diagnosis present

## 2024-03-24 DIAGNOSIS — Z820 Family history of epilepsy and other diseases of the nervous system: Secondary | ICD-10-CM | POA: Diagnosis not present

## 2024-03-24 DIAGNOSIS — Z66 Do not resuscitate: Secondary | ICD-10-CM

## 2024-03-24 DIAGNOSIS — Z7189 Other specified counseling: Secondary | ICD-10-CM

## 2024-03-24 DIAGNOSIS — N1832 Chronic kidney disease, stage 3b: Secondary | ICD-10-CM | POA: Diagnosis present

## 2024-03-24 DIAGNOSIS — I2721 Secondary pulmonary arterial hypertension: Secondary | ICD-10-CM | POA: Diagnosis present

## 2024-03-24 DIAGNOSIS — M341 CR(E)ST syndrome: Secondary | ICD-10-CM | POA: Diagnosis present

## 2024-03-24 DIAGNOSIS — G9341 Metabolic encephalopathy: Secondary | ICD-10-CM | POA: Diagnosis present

## 2024-03-24 DIAGNOSIS — Z1152 Encounter for screening for COVID-19: Secondary | ICD-10-CM | POA: Diagnosis not present

## 2024-03-24 DIAGNOSIS — Z681 Body mass index (BMI) 19 or less, adult: Secondary | ICD-10-CM | POA: Diagnosis not present

## 2024-03-24 DIAGNOSIS — Z86711 Personal history of pulmonary embolism: Secondary | ICD-10-CM | POA: Diagnosis not present

## 2024-03-24 DIAGNOSIS — J9602 Acute respiratory failure with hypercapnia: Secondary | ICD-10-CM

## 2024-03-24 DIAGNOSIS — I350 Nonrheumatic aortic (valve) stenosis: Secondary | ICD-10-CM | POA: Diagnosis present

## 2024-03-24 DIAGNOSIS — J9622 Acute and chronic respiratory failure with hypercapnia: Secondary | ICD-10-CM | POA: Diagnosis present

## 2024-03-24 DIAGNOSIS — I5033 Acute on chronic diastolic (congestive) heart failure: Secondary | ICD-10-CM | POA: Diagnosis present

## 2024-03-24 DIAGNOSIS — J849 Interstitial pulmonary disease, unspecified: Secondary | ICD-10-CM | POA: Diagnosis present

## 2024-03-24 DIAGNOSIS — I3139 Other pericardial effusion (noninflammatory): Secondary | ICD-10-CM | POA: Diagnosis present

## 2024-03-24 DIAGNOSIS — I5032 Chronic diastolic (congestive) heart failure: Secondary | ICD-10-CM | POA: Diagnosis not present

## 2024-03-24 DIAGNOSIS — I130 Hypertensive heart and chronic kidney disease with heart failure and stage 1 through stage 4 chronic kidney disease, or unspecified chronic kidney disease: Secondary | ICD-10-CM | POA: Diagnosis present

## 2024-03-24 DIAGNOSIS — Z833 Family history of diabetes mellitus: Secondary | ICD-10-CM | POA: Diagnosis not present

## 2024-03-24 DIAGNOSIS — Z7984 Long term (current) use of oral hypoglycemic drugs: Secondary | ICD-10-CM | POA: Diagnosis not present

## 2024-03-24 DIAGNOSIS — F419 Anxiety disorder, unspecified: Secondary | ICD-10-CM | POA: Diagnosis present

## 2024-03-24 DIAGNOSIS — K219 Gastro-esophageal reflux disease without esophagitis: Secondary | ICD-10-CM | POA: Diagnosis present

## 2024-03-24 DIAGNOSIS — J9621 Acute and chronic respiratory failure with hypoxia: Secondary | ICD-10-CM | POA: Diagnosis present

## 2024-03-24 DIAGNOSIS — D631 Anemia in chronic kidney disease: Secondary | ICD-10-CM | POA: Diagnosis present

## 2024-03-24 LAB — BASIC METABOLIC PANEL WITH GFR
Anion gap: 12 (ref 5–15)
BUN: 68 mg/dL — ABNORMAL HIGH (ref 8–23)
CO2: 40 mmol/L — ABNORMAL HIGH (ref 22–32)
Calcium: 9.5 mg/dL (ref 8.9–10.3)
Chloride: 89 mmol/L — ABNORMAL LOW (ref 98–111)
Creatinine, Ser: 1.28 mg/dL — ABNORMAL HIGH (ref 0.44–1.00)
GFR, Estimated: 41 mL/min — ABNORMAL LOW (ref >60)
Glucose, Bld: 92 mg/dL (ref 70–99)
Potassium: 4 mmol/L (ref 3.5–5.1)
Sodium: 141 mmol/L (ref 135–145)

## 2024-03-24 LAB — CBC
HCT: 29.2 % — ABNORMAL LOW (ref 36.0–46.0)
Hemoglobin: 8.9 g/dL — ABNORMAL LOW (ref 12.0–15.0)
MCH: 35.9 pg — ABNORMAL HIGH (ref 26.0–34.0)
MCHC: 30.5 g/dL (ref 30.0–36.0)
MCV: 117.7 fL — ABNORMAL HIGH (ref 80.0–100.0)
Platelets: 121 10*3/uL — ABNORMAL LOW (ref 150–400)
RBC: 2.48 MIL/uL — ABNORMAL LOW (ref 3.87–5.11)
RDW: 12 % (ref 11.5–15.5)
WBC: 6.4 10*3/uL (ref 4.0–10.5)
nRBC: 0 % (ref 0.0–0.2)

## 2024-03-24 LAB — BLOOD GAS, VENOUS
Acid-Base Excess: 24 mmol/L — ABNORMAL HIGH (ref 0.0–2.0)
Bicarbonate: 54.5 mmol/L — ABNORMAL HIGH (ref 20.0–28.0)
O2 Saturation: 44.7 %
Patient temperature: 37
pCO2, Ven: 88 mmHg (ref 44–60)
pH, Ven: 7.4 (ref 7.25–7.43)
pO2, Ven: <31 mmHg — CL (ref 32–45)

## 2024-03-24 LAB — URINALYSIS, ROUTINE W REFLEX MICROSCOPIC
Bilirubin Urine: NEGATIVE
Glucose, UA: NEGATIVE mg/dL
Hgb urine dipstick: NEGATIVE
Ketones, ur: NEGATIVE mg/dL
Leukocytes,Ua: NEGATIVE
Nitrite: NEGATIVE
Protein, ur: NEGATIVE mg/dL
Specific Gravity, Urine: 1.006 (ref 1.005–1.030)
pH: 6 (ref 5.0–8.0)

## 2024-03-24 LAB — C-REACTIVE PROTEIN: CRP: 0.5 mg/dL (ref <1.0)

## 2024-03-24 LAB — MRSA NEXT GEN BY PCR, NASAL: MRSA by PCR Next Gen: NOT DETECTED

## 2024-03-24 LAB — ERYTHROPOIETIN: Erythropoietin: 13.8 m[IU]/mL (ref 2.6–18.5)

## 2024-03-24 MED ORDER — ALBUTEROL SULFATE (2.5 MG/3ML) 0.083% IN NEBU: 2.5000 mg | INHALATION_SOLUTION | Freq: Four times a day (QID) | RESPIRATORY_TRACT | Status: DC | PRN

## 2024-03-24 MED ORDER — FUROSEMIDE 40 MG PO TABS
40.0000 mg | ORAL_TABLET | Freq: Every day | ORAL | Status: DC
  Administered 2024-03-24 – 2024-03-26 (×3): 40 mg via ORAL
  Filled 2024-03-24 (×3): qty 1

## 2024-03-24 MED ORDER — LORAZEPAM 0.5 MG PO TABS: 0.2500 mg | ORAL_TABLET | ORAL | Status: DC | PRN

## 2024-03-24 MED ORDER — CHLORHEXIDINE GLUCONATE CLOTH 2 % EX PADS
6.0000 | MEDICATED_PAD | Freq: Every day | CUTANEOUS | Status: DC
  Administered 2024-03-24 – 2024-03-26 (×3): 6 via TOPICAL

## 2024-03-24 MED ORDER — ARFORMOTEROL TARTRATE 15 MCG/2ML IN NEBU
15.0000 ug | INHALATION_SOLUTION | Freq: Two times a day (BID) | RESPIRATORY_TRACT | Status: DC
  Administered 2024-03-24 – 2024-03-26 (×5): 15 ug via RESPIRATORY_TRACT
  Filled 2024-03-24 (×5): qty 2

## 2024-03-24 MED ORDER — SENNA 8.6 MG PO TABS
1.0000 | ORAL_TABLET | Freq: Two times a day (BID) | ORAL | Status: DC
  Administered 2024-03-24 – 2024-03-26 (×5): 8.6 mg via ORAL
  Filled 2024-03-24 (×5): qty 1

## 2024-03-24 MED ORDER — ORAL CARE MOUTH RINSE
15.0000 mL | OROMUCOSAL | Status: DC
  Administered 2024-03-24 – 2024-03-26 (×6): 15 mL via OROMUCOSAL

## 2024-03-24 MED ORDER — DILTIAZEM HCL 30 MG PO TABS
30.0000 mg | ORAL_TABLET | Freq: Two times a day (BID) | ORAL | Status: DC
  Administered 2024-03-24 – 2024-03-26 (×4): 30 mg via ORAL
  Filled 2024-03-24 (×4): qty 1

## 2024-03-24 MED ORDER — LATANOPROST 0.005 % OP SOLN
1.0000 [drp] | Freq: Every day | OPHTHALMIC | Status: DC
  Administered 2024-03-24 – 2024-03-25 (×2): 1 [drp] via OPHTHALMIC
  Filled 2024-03-24: qty 2.5

## 2024-03-24 MED ORDER — ORAL CARE MOUTH RINSE: 15.0000 mL | OROMUCOSAL | Status: DC | PRN

## 2024-03-24 MED ORDER — LORAZEPAM 2 MG/ML PO CONC
0.2500 mg | ORAL | Status: DC | PRN
  Administered 2024-03-24: 0.26 mg via ORAL
  Filled 2024-03-24: qty 0.13

## 2024-03-24 MED ORDER — OXYCODONE HCL 5 MG/5ML PO SOLN
0.2500 mg | ORAL | Status: DC | PRN
  Administered 2024-03-24 (×2): 0.25 mg via ORAL
  Filled 2024-03-24 (×2): qty 5

## 2024-03-24 NOTE — Consult Note (Signed)
 Consultation Note Date: 03/24/2024   Patient Name: Paula Ward  DOB: 1936/05/01  MRN: [id number]  Age / Sex: 88 y.o., female   PCP: Adelia Homestead, MD Referring Physician: Verlyn Goad, MD  Reason for Consultation: Establishing goals of care     Chief Complaint/History of Present Illness:   Patient is an 88 year old female with a past medical history of pulmonary hypertension, ILD, restrictive lung disease secondary to CTD/scleroderma, chronic respiratory failure on 6 L nasal cannula, HFpEF, CKD stage IIIb, aortic stenosis, RCC and breast cancer, and history of pericardial effusion who was sent from cancer center on 03/23/2024 for management of abnormal lab work.  Family had reported that patient over the past couple weeks has had increasing confusion, drowsiness, falling asleep on the telephone, and garbled speech.  Patient also had a fall on Monday prior to presentation where she hit the back of her head.  Since admission, patient has been managed for acute on chronic respiratory failure with hypoxia and hypercapnia secondary to ILD/restrictive lung disease from crest and scleroderma and PAH.  Palliative medicine team consulted to assist with complex medical decision making.  Extensive review of EMR prior to presenting to bedside.  Reviewed recent hospitalist note and outpatient pulmonology notes.  Review of recent BMP noting BUN 68, creatinine 1.28, and so GFR 41. Personally reviewed recent head CT imaging showing chronic microvascular changes. Discussed care with hospitalist and RN for medical updates and to coordinate care.  Presented to bedside to meet with patient.  Patient lying comfortably in bed on nasal cannula.  Multiple people present at bedside.  Patient able to introduce her husband, son, daughter-in-law, and caregiver present.  Patient gave permission to continue with conversation.  Introduced myself as a member of the palliative medicine team my role in patient's  medical journey.  Spent time learning about patient's medical journey up into this point.  Family describes symptoms patient was having at home.  Discussed how these symptoms are related to patient's underlying medical conditions including pulmonary hypertension and lung disease.  Discussed how patient has severe lung disease and this causes CO2 retention leading to symptoms.  Discussed unfortunately there is not a "fix" for patient's. Able to discuss pathways for medical care moving forward.  Patient hoping to have fourth time at home.  Discussed with her underlying medical conditions, if her goal is to enjoy quality time at home with family, would recommend hospice support.  Spent time describing general details of what hospice philosophy was and what home hospice would and would not provide.  Family initially stating patient would not want hospice though after explanation, patient stated that hospice sounds like exactly what she needs so she can enjoy time at home.  Family already has 1 caregiver in place though we will plan to discuss with agency about having 24/7 support for patient at home.  Noted would involve TOC to offer hospice choice.  Also able to inquire about symptoms.  Patient notes that she does have anxiety.  Discussed starting low dose liquid Ativan  to assist with this.  Also discussed addition of low-dose opioid to assist with shortness of breath management as patiently have this symptom as well.  Spent time answering questions as able.  Provided emotional support via active listening.  Noted palliative medicine team will continue to follow along with patient's medical journey.  Able to discuss care with hospitalist, [facility], RN, and pulmonology to coordinate care.  Primary Diagnoses  Present on Admission:  Acute on  chronic respiratory failure with hypoxia and hypercapnia (HCC)  Anemia, chronic disease  Chronic diastolic heart failure (HCC)  Chronic kidney disease, stage 3b (HCC)   Chronic pericardial effusion  Essential hypertension  GASTROESOPHAGEAL REFLUX DISEASE  ILD (interstitial lung disease) (HCC)  Other secondary pulmonary hypertension (HCC)   Palliative Review of Systems: Anxiety, shortness of breath  Past Medical History:  Diagnosis Date   Anemia    Angiodysplasia of stomach and duodenum    Aortic stenosis    Arthus phenomenon    AVM (arteriovenous malformation)    Blood transfusion without reported diagnosis    Breast cancer (HCC) 1989   Left   Candida esophagitis (HCC)    Cataract    Chronic respiratory failure (HCC)    CKD stage 3b, GFR 30-44 ml/min (HCC)    Corneal edema    Corneal epithelial basement membrane dystrophy    CREST syndrome (HCC)    GERD (gastroesophageal reflux disease)    w/ HH   Interstitial lung disease (HCC)    Nodule of kidney    Pericardial effusion    PONV (postoperative nausea and vomiting)    Pulmonary embolus (HCC) 2003   Pulmonary hypertension (HCC)    Rectal incontinence    Renal cell carcinoma (HCC)    Scleroderma (HCC)    Tubular adenoma of colon    Uterine polyp    Social History   Socioeconomic History   Marital status: Married    Spouse name: Not on file   Number of children: 2   Years of education: Not on file   Highest education level: Not on file  Occupational History   Occupation: Retired  Tobacco Use   Smoking status: Never   Smokeless tobacco: Never  Vaping Use   Vaping status: Never Used  Substance and Sexual Activity   Alcohol use: No   Drug use: No   Sexual activity: Not Currently  Other Topics Concern   Not on file  Social History Narrative   Married '611 son- '65, 1 daughter '63; 6 children (2 adopted)SO- SOBRetirement- doing well. Marriage in good health. Patient has never smoked. Alcohol use- noPt gets regular exercise   Social Drivers of Health   Financial Resource Strain: Low Risk  (04/02/2022)   Overall Financial Resource Strain (CARDIA)    Difficulty of Paying  Living Expenses: Not hard at all  Food Insecurity: No Food Insecurity (03/24/2024)   Hunger Vital Sign    Worried About Running Out of Food in the Last Year: Never true    Ran Out of Food in the Last Year: Never true  Transportation Needs: No Transportation Needs (03/24/2024)   PRAPARE - Administrator, Civil Service (Medical): No    Lack of Transportation (Non-Medical): No  Physical Activity: Inactive (04/02/2022)   Exercise Vital Sign    Days of Exercise per Week: 0 days    Minutes of Exercise per Session: 0 min  Stress: No Stress Concern Present (04/02/2022)   [employer] of Occupational Health - Occupational Stress Questionnaire    Feeling of Stress : Not at all  Social Connections: Socially Integrated (03/24/2024)   Social Connection and Isolation Panel [NHANES]    Frequency of Communication with Friends and Family: More than three times a week    Frequency of Social Gatherings with Friends and Family: More than three times a week    Attends Religious Services: More than 4 times per year    Active Member of [employer] or Organizations:  Yes    Attends Banker Meetings: More than 4 times per year    Marital Status: Married   Family History  Problem Relation Age of Onset   Bladder Cancer Father    Diabetes Father    Prostate cancer Father    Alzheimer's disease Mother    Diabetes Sister    Lung cancer Sister         smoker   Esophageal cancer Paternal Uncle    Colon cancer Neg Hx    Scheduled Meds:  arformoterol   15 mcg Nebulization BID   Chlorhexidine  Gluconate Cloth  6 each Topical Daily   enoxaparin  (LOVENOX ) injection  30 mg Subcutaneous Q24H   mouth rinse  15 mL Mouth Rinse 4 times per day   pantoprazole  (PROTONIX ) IV  40 mg Intravenous Q12H   Continuous Infusions: PRN Meds:.acetaminophen  **OR** acetaminophen , albuterol , hydrALAZINE , LORazepam , ondansetron  **OR** ondansetron  (ZOFRAN ) IV, mouth rinse, oxyCODONE  Allergies  Allergen Reactions    Codeine  Nausea Only    Hallucinations, too   Other Nausea And Vomiting and Other (See Comments)    "-mycin" antibiotics.   Also cause hallucinations.   Erythromycin Nausea And Vomiting   Iodinated Contrast Media Other (See Comments)    Renal issues   Lisinopril Other (See Comments)    Pt doesn't remember reaction   Mirtazapine  Other (See Comments)    "Threw me into orbit"   Mycophenolate Mofetil Nausea Only    Made me nervous   Sulfa  Antibiotics Other (See Comments)    Unknown reaction   CBC:    Component Value Date/Time   WBC 6.4 03/24/2024 0311   HGB 8.9 (L) 03/24/2024 0311   HGB 8.8 (L) 03/23/2024 1439   HGB 11.2 04/21/2022 0940   HCT 29.2 (L) 03/24/2024 0311   HCT 32.7 (L) 04/21/2022 0940   PLT 121 (L) 03/24/2024 0311   PLT 118 (L) 03/23/2024 1439   PLT 242 04/21/2022 0940   MCV 117.7 (H) 03/24/2024 0311   MCV 104 (H) 04/21/2022 0940   NEUTROABS 4.1 03/23/2024 1730   LYMPHSABS 0.7 03/23/2024 1730   MONOABS 0.3 03/23/2024 1730   EOSABS 0.3 03/23/2024 1730   BASOSABS 0.0 03/23/2024 1730   Comprehensive Metabolic Panel:    Component Value Date/Time   NA 141 03/24/2024 0311   NA 145 (H) 10/30/2023 1434   K 4.0 03/24/2024 0311   CL 89 (L) 03/24/2024 0311   CO2 40 (H) 03/24/2024 0311   BUN 68 (H) 03/24/2024 0311   BUN 48 (H) 10/30/2023 1434   CREATININE 1.28 (H) 03/24/2024 0311   CREATININE 1.56 (H) 03/23/2024 1439   GLUCOSE 92 03/24/2024 0311   GLUCOSE 101 (H) 10/02/2006 0845   CALCIUM  9.5 03/24/2024 0311   AST 23 03/23/2024 1439   ALT 11 03/23/2024 1439   ALKPHOS 59 03/23/2024 1439   BILITOT 0.6 03/23/2024 1439   PROT 7.1 03/23/2024 1439   ALBUMIN 4.3 03/23/2024 1439    Physical Exam: Vital Signs: BP (!) 160/54   Pulse 99   Temp 98.3 F (36.8 C) (Oral)   Resp (!) 28   Ht 5\' 3"  (1.6 m)   Wt 46.3 kg   SpO2 92%   BMI 18.08 kg/m  SpO2: SpO2: 92 % O2 Device: O2 Device: Nasal Cannula O2 Flow Rate: O2 Flow Rate (L/min): 2 L/min Intake/output summary:   Intake/Output Summary (Last 24 hours) at 03/24/2024 1406 Last data filed at 03/24/2024 1200 Gross per 24 hour  Intake 0 ml  Output  1000 ml  Net -1000 ml   LBM: Last BM Date : 03/23/24 Baseline Weight: Weight: 48 kg Most recent weight: Weight: 46.3 kg  General: NAD, alert, chronically ill-appearing, cachectic, frail Cardiovascular: Tachycardia noted Respiratory: Slightly increased work of breathing noted, on nasal cannula O2 Neuro: Alert, interactive Psych: Anxious         Palliative Performance Scale: 40%               Additional Data Reviewed: Recent Labs    03/23/24 1730 03/24/24 0311  WBC 5.5 6.4  HGB 9.0* 8.9*  PLT 123* 121*  NA 141 141  BUN 71* 68*  CREATININE 1.43* 1.28*    Imaging: CT HEAD WO CONTRAST ( ) CLINICAL DATA:  Shortness of breath with weakness and fatigue.  EXAM: CT HEAD WITHOUT CONTRAST  TECHNIQUE: Contiguous axial images were obtained from the base of the skull through the vertex without intravenous contrast.  RADIATION DOSE REDUCTION: This exam was performed according to the departmental dose-optimization program which includes automated exposure control, adjustment of the mA and/or kV according to patient size and/or use of iterative reconstruction technique.  COMPARISON:  October 10, 2018  FINDINGS: Brain: No evidence of acute infarction, hemorrhage, hydrocephalus, extra-axial collection or mass lesion/mass effect. There are areas of decreased attenuation within the white matter tracts of the supratentorial brain, consistent with microvascular disease changes.  Vascular: Moderate to marked severity bilateral cavernous carotid artery calcification is noted.  Skull: Normal. Negative for fracture or focal lesion.  Sinuses/Orbits: No acute finding.  Other: None.  IMPRESSION: 1. No acute intracranial abnormality. 2. Generalized cerebral atrophy with chronic white matter small vessel ischemic changes.  Electronically Signed    By: Virgle Grime M.D.   On: 03/23/2024 21:46 DG Chest Port 1 View CLINICAL DATA:  Shortness of breath.  EXAM: PORTABLE CHEST 1 VIEW  COMPARISON:  12/04/2023 and CT chest 04/23/2022.  FINDINGS: Trachea is midline. Globular and enlarged cardiopericardial silhouette, as on 12/04/2023. New mixed interstitial and airspace opacification. Left pleural effusion.  IMPRESSION: 1. Congestive heart failure. 2. Cardiac enlargement with a large pericardial effusion, unchanged from 12/04/2023.  Electronically Signed   By: Shearon Denis M.D.   On: 03/23/2024 17:58    I personally reviewed recent imaging.   Palliative Care Assessment and Plan Summary of Established Goals of Care and Medical Treatment Preferences   Patient is an 88 year old female with a past medical history of pulmonary hypertension, ILD, restrictive lung disease secondary to CTD/scleroderma, chronic respiratory failure on 6 L nasal cannula, HFpEF, CKD stage IIIb, aortic stenosis, RCC and breast cancer, and history of pericardial effusion who was sent from cancer center on 03/23/2024 for management of abnormal lab work.  Family had reported that patient over the past couple weeks has had increasing confusion, drowsiness, falling asleep on the telephone, and garbled speech.  Patient also had a fall on Monday prior to presentation where she hit the back of her head.  Since admission, patient has been managed for acute on chronic respiratory failure with hypoxia and hypercapnia secondary to ILD/restrictive lung disease from crest and scleroderma and PAH.  Palliative medicine team consulted to assist with complex medical decision making.  # Complex medical decision making/goals of care  - Discussed care with patient and family at bedside as detailed above in HPI.  Patient has multiple medical core morbidities contributing to worsening respiratory status.  Discussed pathways for care moving forward knowing that patient does not have  a lung disease that can  be reversed.  Patient notes she would like to be able to spend time at home with her family and not have to keep coming back to the hospital.  Discussed patient going home with hospice support.  Family and patient agreeing with this plan.  Have consulted TOC to provide hospice choices.  -  Code Status: Limited: Do not attempt resuscitation (DNR) -DNR-LIMITED -Do Not Intubate/DNI    # Symptom management Patient is receiving these palliative interventions for symptom management with an intent to improve quality of life.   - Shortness of breath, in setting of severe restrictive lung disease and chronic respiratory failure   - Start 0.25 mg oxycodone  solution every 3 hours as needed   - Start senna 1 tab twice daily while patient receiving opioids   - Anxiety   - Start oral Ativan  solution 0.26 mg every 4 hours as needed  # Psycho-social/Spiritual Support:  - Support System: Husband, son, daughter-in-law - Engineer, site for spiritual support  # Discharge Planning:  Home with Hospice  Thank you for allowing the palliative care team to participate in the care Belenda Bowie.  Barnett Libel, DO Palliative Care Provider PMT # [phone]  If patient remains symptomatic despite maximum doses, please call PMT at [phone] between 0700 and 1900. Outside of these hours, please call attending, as PMT does not have night coverage.  Personally spent 80 minutes in patient care including extensive chart review (labs, imaging, progress/consult notes, vital signs), medically appropraite exam, discussed with treatment team, education to patient, family, and staff, documenting clinical information, medication review and management, coordination of care, and available advanced directive documents.

## 2024-03-24 NOTE — Progress Notes (Signed)
 Pt off BIPAP and placed on 2 LPM [place]. Pt doing well at this time.

## 2024-03-24 NOTE — Progress Notes (Addendum)
 PROGRESS NOTE    Paula Ward  OZH:[id number] DOB: 01/13/1936 DOA: 03/23/2024 PCP: Adelia Homestead, MD   Brief Narrative: Paula Ward is a 88 y.o. female with a history of pulm hypertension, interstitial lung disease, restrictive lung disease secondary to history of disease/scleroderma, diastolic heart failure, CKD stage IIIb, aortic stenosis, chronic respiratory failure on 6 L of supplemental oxygen , renal cell carcinoma, breast cancer.  Patient presented secondary to low hemoglobin and elevated CO2 on BMP with associated confusion.  Patient with concern for acute on chronic respiratory failure with hypoxia and hypercapnia.  Patient started on BiPAP with improvement in symptoms.  Palliative consulted secondary to patient's underlying medical condition with concern for future prognosis.  Decision made for patient to discharge home with home hospice once medically ready for discharge.   Assessment and Plan:  Acute on chronic respite failure with hypoxia and hypercapnia Insetting of underlying interstitial lung disease/restrictive lung disease.  Patient follows with pulmonology as an outpatient.  VBG obtained on admission was significant for a venous pCO2 of 86 with normal venous pH of 7.37.  Patient was started on BiPAP with improvement. - Continue supplemental oxygen  - Will see if patient qualifies for NIV - Continue BiPAP at night and as needed  Interstitial lung disease Restrictive lung disease Pulmonary artery hypertension Patient follows with pulmonology. Currently considered end stage. Palliative care consulted and decision made to transition to home with hospice care.  Acute metabolic encephalopathy Likely secondary to hypoxia/hypercapnia. Improved with BiPAP. Appears to be resolved.  Anemia of chronic disease Stable.  Acute on chronic diastolic heart failure Patient managed with Lasix  IV on admission. Appears to be stable.  CKD stage IIIb Stable.  Chronic  pericardial effusion Noted.  Primary hypertension Patient is managed on Cardizem  and spironolactone  as an outpatient which were held. -Resume Cardizem   Underweight Estimated body mass index is 18.08 kg/m as calculated from the following:   Height as of this encounter: 5\' 3"  (1.6 m).   Weight as of this encounter: 46.3 kg.    DVT prophylaxis: Lovenox  Code Status:   Code Status: Limited: Do not attempt resuscitation (DNR) -DNR-LIMITED -Do Not Intubate/DNI  Family Communication: None at bedside Disposition Plan: Discharge home    Consultants:  Palliative care medicine  Procedures:  BiPAP  Antimicrobials: None    Subjective: Patient reports feeling well. She knows she was admitted because of confusion. Breathing well. Hungry and thirsty.  Objective: BP (!) 129/57   Pulse 91   Temp 98.6 F (37 C) (Axillary)   Resp (!) 25   Ht 5\' 3"  (1.6 m)   Wt 46.3 kg   SpO2 100%   BMI 18.08 kg/m   Examination:  General exam: Appears calm and comfortable Respiratory system: Rales. Respiratory effort normal. Cardiovascular system: S1 & S2 heard, RRR. No murmurs, rubs, gallops or clicks. Gastrointestinal system: Abdomen is nondistended, soft and nontender. No organomegaly or masses felt. Normal bowel sounds heard. Central nervous system: Alert and oriented. No focal neurological deficits. Musculoskeletal: No edema. No calf tenderness. Large left anterior chest wall mass Skin: No cyanosis. No rashes Psychiatry: Judgement and insight appear normal. Mood & affect appropriate.    Data Reviewed: I have personally reviewed following labs and imaging studies  CBC Lab Results  Component Value Date   WBC 6.4 03/24/2024   RBC 2.48 (L) 03/24/2024   HGB 8.9 (L) 03/24/2024   HCT 29.2 (L) 03/24/2024   MCV 117.7 (H) 03/24/2024   MCH 35.9 (H)  03/24/2024   PLT 121 (L) 03/24/2024   MCHC 30.5 03/24/2024   RDW 12.0 03/24/2024   LYMPHSABS 0.7 03/23/2024   MONOABS 0.3 03/23/2024   EOSABS  0.3 03/23/2024   BASOSABS 0.0 03/23/2024     Last metabolic panel Lab Results  Component Value Date   NA 141 03/24/2024   K 4.0 03/24/2024   CL 89 (L) 03/24/2024   CO2 40 (H) 03/24/2024   BUN 68 (H) 03/24/2024   CREATININE 1.28 (H) 03/24/2024   GLUCOSE 92 03/24/2024   GFRNONAA 41 (L) 03/24/2024   GFRAA >60 05/24/2018   CALCIUM  9.5 03/24/2024   PHOS 4.3 08/14/2023   PROT 7.1 03/23/2024   ALBUMIN 4.3 03/23/2024   LABGLOB 2.4 02/11/2023   BILITOT 0.6 03/23/2024   ALKPHOS 59 03/23/2024   AST 23 03/23/2024   ALT 11 03/23/2024   ANIONGAP 12 03/24/2024    GFR: Estimated Creatinine Clearance: 23.5 mL/min (A) (by C-G formula based on SCr of 1.28 mg/dL (H)).  Recent Results (from the past 240 hours)  Resp panel by RT-PCR (RSV, Flu A&B, Covid) Anterior Nasal Swab     Status: None   Collection Time: 03/23/24  6:34 PM   Specimen: Anterior Nasal Swab  Result Value Ref Range Status   SARS Coronavirus 2 by RT PCR NEGATIVE NEGATIVE Final    Comment: (NOTE) SARS-CoV-2 target nucleic acids are NOT DETECTED.  The SARS-CoV-2 RNA is generally detectable in upper respiratory specimens during the acute phase of infection. The lowest concentration of SARS-CoV-2 viral copies this assay can detect is 138 copies/mL. A negative result does not preclude SARS-Cov-2 infection and should not be used as the sole basis for treatment or other patient management decisions. A negative result may occur with  improper specimen collection/handling, submission of specimen other than nasopharyngeal swab, presence of viral mutation(s) within the areas targeted by this assay, and inadequate number of viral copies(<138 copies/mL). A negative result must be combined with clinical observations, patient history, and epidemiological information. The expected result is Negative.  Fact Sheet for Patients:  BloggerCourse.com  Fact Sheet for Healthcare Providers:   SeriousBroker.it  This test is no t yet approved or cleared by the [place]  FDA and  has been authorized for detection and/or diagnosis of SARS-CoV-2 by FDA under an Emergency Use Authorization (EUA). This EUA will remain  in effect (meaning this test can be used) for the duration of the COVID-19 declaration under Section 564(b)(1) of the Act, 21 U.S.C.section 360bbb-3(b)(1), unless the authorization is terminated  or revoked sooner.       Influenza A by PCR NEGATIVE NEGATIVE Final   Influenza B by PCR NEGATIVE NEGATIVE Final    Comment: (NOTE) The Xpert Xpress SARS-CoV-2/FLU/RSV plus assay is intended as an aid in the diagnosis of influenza from Nasopharyngeal swab specimens and should not be used as a sole basis for treatment. Nasal washings and aspirates are unacceptable for Xpert Xpress SARS-CoV-2/FLU/RSV testing.  Fact Sheet for Patients: BloggerCourse.com  Fact Sheet for Healthcare Providers: SeriousBroker.it  This test is not yet approved or cleared by the [place]  FDA and has been authorized for detection and/or diagnosis of SARS-CoV-2 by FDA under an Emergency Use Authorization (EUA). This EUA will remain in effect (meaning this test can be used) for the duration of the COVID-19 declaration under Section 564(b)(1) of the Act, 21 U.S.C. section 360bbb-3(b)(1), unless the authorization is terminated or revoked.     Resp Syncytial Virus by PCR NEGATIVE NEGATIVE Final  Comment: (NOTE) Fact Sheet for Patients: BloggerCourse.com  Fact Sheet for Healthcare Providers: SeriousBroker.it  This test is not yet approved or cleared by the [place]  FDA and has been authorized for detection and/or diagnosis of SARS-CoV-2 by FDA under an Emergency Use Authorization (EUA). This EUA will remain in effect (meaning this test can be used) for  the duration of the COVID-19 declaration under Section 564(b)(1) of the Act, 21 U.S.C. section 360bbb-3(b)(1), unless the authorization is terminated or revoked.  Performed at [facility], [address] [place], [place] [address]   MRSA Next Gen by PCR, Nasal     Status: None   Collection Time: 03/24/24 12:12 AM   Specimen: Nasal Mucosa; Nasal Swab  Result Value Ref Range Status   MRSA by PCR Next Gen NOT DETECTED NOT DETECTED Final    Comment: (NOTE) The GeneXpert MRSA Assay (FDA approved for NASAL specimens only), is one component of a comprehensive MRSA colonization surveillance program. It is not intended to diagnose MRSA infection nor to guide or monitor treatment for MRSA infections. Test performance is not FDA approved in patients less than 48 years old. Performed at [facility], [address], [place], [place] [address]       Radiology Studies: CT HEAD WO CONTRAST ( ) Result Date: 03/23/2024 CLINICAL DATA:  Shortness of breath with weakness and fatigue. EXAM: CT HEAD WITHOUT CONTRAST TECHNIQUE: Contiguous axial images were obtained from the base of the skull through the vertex without intravenous contrast. RADIATION DOSE REDUCTION: This exam was performed according to the departmental dose-optimization program which includes automated exposure control, adjustment of the mA and/or kV according to patient size and/or use of iterative reconstruction technique. COMPARISON:  October 10, 2018 FINDINGS: Brain: No evidence of acute infarction, hemorrhage, hydrocephalus, extra-axial collection or mass lesion/mass effect. There are areas of decreased attenuation within the white matter tracts of the supratentorial brain, consistent with microvascular disease changes. Vascular: Moderate to marked severity bilateral cavernous carotid artery calcification is noted. Skull: Normal. Negative for fracture or focal lesion. Sinuses/Orbits: No acute  finding. Other: None. IMPRESSION: 1. No acute intracranial abnormality. 2. Generalized cerebral atrophy with chronic white matter small vessel ischemic changes. Electronically Signed   By: Virgle Grime M.D.   On: 03/23/2024 21:46   DG Chest Port 1 View Result Date: 03/23/2024 CLINICAL DATA:  Shortness of breath. EXAM: PORTABLE CHEST 1 VIEW COMPARISON:  12/04/2023 and CT chest 04/23/2022. FINDINGS: Trachea is midline. Globular and enlarged cardiopericardial silhouette, as on 12/04/2023. New mixed interstitial and airspace opacification. Left pleural effusion. IMPRESSION: 1. Congestive heart failure. 2. Cardiac enlargement with a large pericardial effusion, unchanged from 12/04/2023. Electronically Signed   By: Shearon Denis M.D.   On: 03/23/2024 17:58      LOS: 0 days    Aneita Keens, MD [facility] 03/24/2024, 10:38 AM   If 7PM-7AM, please contact night-coverage [email or web address]

## 2024-03-24 NOTE — TOC Initial Note (Signed)
 Transition of Care [facility]) - Initial/Assessment Note    Patient Details  Name: Paula Ward MRN: [id number] Date of Birth: Nov 23, 1935  Transition of Care [facility]) CM/SW Contact:    Tessie Fila, RN Phone Number: 03/24/2024, 2:29 PM  Clinical Narrative:                 TOC received consult for hospice choice for pt. NCM met with pt and her family at bedside to discuss her choices in hospice care at home. Pt and her family chose [facility]. While at bedside pt states she would like her POC to be her son Alegria Dominique [phone]. NCM spoke with Melissa with [facility] and asked her to speak with the pt and her family about starting services with [facility], Lovett Ruck has agreed to speak with the pt and her family. Pt will have DME needs at time of discharge. TOC will continue to follow to assist with needs at discharge.  Expected Discharge Plan: Home w Hospice Care Barriers to Discharge: Continued Medical Work up   Patient Goals and CMS Choice Patient states their goals for this hospitalization and ongoing recovery are:: To return home CMS Medicare.gov Compare Post Acute Care list provided to:: Patient Choice offered to / list presented to : Patient [place] ownership interest in [facility].provided to:: Patient    Expected Discharge Plan and Services In-house Referral: Hospice / Palliative Care, Clinical Social Work Discharge Planning Services: CM Consult Post Acute Care Choice: Hospice Living arrangements for the past 2 months: Single Family Home                 DME Arranged: N/A DME Agency: NA         HH Agency: Other - See comment Photographer) Date HH Agency Contacted: 03/24/24 Time HH Agency Contacted: 1320 Representative spoke with at [facility] Agency: Ardine Beckwith- Civil engineer, contracting  Prior Living Arrangements/Services Living arrangements for the past 2 months: Single Family Home Lives with:: Spouse Patient language and need for interpreter  reviewed:: Yes Do you feel safe going back to the place where you live?: Yes      Need for Family Participation in Patient Care: Yes (Comment) Care giver support system in place?: Yes (comment) Current home services: DME Criminal Activity/Legal Involvement Pertinent to Current Situation/Hospitalization: No - Comment as needed  Activities of Daily Living   ADL Screening (condition at time of admission) Independently performs ADLs?: No Does the patient have a NEW difficulty with bathing/dressing/toileting/self-feeding that is expected to last >3 days?: No Does the patient have a NEW difficulty with getting in/out of bed, walking, or climbing stairs that is expected to last >3 days?: No Does the patient have a NEW difficulty with communication that is expected to last >3 days?: No Is the patient deaf or have difficulty hearing?: No Does the patient have difficulty seeing, even when wearing glasses/contacts?: No Does the patient have difficulty concentrating, remembering, or making decisions?: No  Permission Sought/Granted Permission sought to share information with : Facility Medical sales representative, Family Supports Permission granted to share information with : Yes, Verbal Permission Granted  Share Information with NAME: Brenly Trawick [phone]  Permission granted to share info w AGENCY: Civil engineer, contracting        Emotional Assessment Appearance:: Appears stated age Attitude/Demeanor/Rapport: Engaged Affect (typically observed): Accepting Orientation: : Oriented to Self, Oriented to Place, Oriented to  Time, Oriented to Situation Alcohol / Substance Use: Not Applicable Psych Involvement: No (comment)  Admission diagnosis:  Acute respiratory failure with hypoxia and hypercarbia (HCC) [J96.01, J96.02] Acute on chronic respiratory failure with hypoxia and hypercapnia (HCC) [Z61.09, J96.22] Patient Active Problem List   Diagnosis Date Noted   Acute on chronic respiratory failure  with hypoxia and hypercapnia (HCC) 03/23/2024   Acute metabolic encephalopathy 03/23/2024   Influenza A 01/22/2024   Pulmonary arterial hypertension (HCC) 09/01/2023   Elevated troponin I level 08/31/2023   Nonrheumatic aortic valve stenosis 08/31/2023   Scleroderma (HCC) 08/31/2023   Chronic pericardial effusion 08/31/2023   Hyponatremia 08/13/2023   Cardiomegaly 08/13/2023   Orthostatic hypotension 05/21/2023   Vitamin B12 deficiency 03/02/2023   Closed comminuted intertrochanteric fracture of left femur (HCC) 10/10/2022   Chronic respiratory failure with hypoxia (HCC) 10/10/2022   Adjustment disorder 10/02/2022   Generalized weakness 03/29/2021   Chronic diastolic heart failure (HCC) 03/29/2021   AVM (arteriovenous malformation) of small bowel, acquired    Anemia, chronic disease 12/25/2020   Globus pharyngeus 08/19/2019   Dry mouth 02/15/2018   Allergic rhinitis 11/05/2017   Cerebellar 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 (HCC) 10/26/2017   Pericardial effusion 01/21/2016   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HCC)    Osteoarthritis 02/25/2015   History of pulmonary embolism 01/29/2013   Chronic kidney disease, stage 3b (HCC) 09/01/2012   RECTAL INCONTINENCE 03/27/2009   Angiodysplasia of stomach and duodenum 08/22/2008   ADENOCARCINOMA, BREAST, LEFT 07/26/2007   Essential hypertension 07/26/2007   Other secondary pulmonary hypertension (HCC) 07/26/2007   GASTROESOPHAGEAL REFLUX DISEASE 07/26/2007   ILD (interstitial lung disease) (HCC) 07/26/2007   PCP:  Adelia Homestead, MD Pharmacy:   [facility] [address], [place] - [address] [place] [address] Phone: [phone] Fax: [phone]     Social Drivers of Health (SDOH) Social History: SDOH Screenings   Food Insecurity: No Food Insecurity (03/24/2024)  Housing: Low Risk  (03/24/2024)  Transportation Needs: No Transportation Needs (03/24/2024)  Utilities: Not At  Risk (03/24/2024)  Alcohol Screen: Low Risk  (04/02/2022)  Depression (PHQ2-9): Low Risk  (02/05/2024)  Financial Resource Strain: Low Risk  (04/02/2022)  Physical Activity: Inactive (04/02/2022)  Social Connections: Socially Integrated (03/24/2024)  Stress: No Stress Concern Present (04/02/2022)  Tobacco Use: Low Risk  (03/23/2024)   SDOH Interventions:     Readmission Risk Interventions    03/24/2024    2:22 PM 10/12/2022    9:05 AM 03/18/2022    3:45 PM  Readmission Risk Prevention Plan  Transportation Screening Complete Complete Complete  PCP or Specialist Appt within 3-5 Days Complete  Complete  HRI or Home Care Consult Complete  Complete  Social Work Consult for Recovery Care Planning/Counseling Complete  Complete  Palliative Care Screening Complete  Not Applicable  Medication Review Oceanographer) Complete Complete Complete  PCP or Specialist appointment within 3-5 days of discharge  Complete   HRI or Home Care Consult  Complete   SW Recovery Care/Counseling Consult  Complete   Palliative Care Screening  Not Applicable   Skilled Nursing Facility  Complete

## 2024-03-24 NOTE — Progress Notes (Signed)
 I spent time with Paula Ward and her husband and their caregiver. They shared about their family including 2 children and 6 grandchildren. They have been married for almost 64 years. They have not expressed any needs at this time, but are aware of our ongoing availability for support.  Will continue to follow, but please page or consult as needed.  [phone].

## 2024-03-24 NOTE — Hospital Course (Signed)
 Paula Ward is a 88 y.o. female with a history of pulm hypertension, interstitial lung disease, restrictive lung disease secondary to history of disease/scleroderma, diastolic heart failure, CKD stage IIIb, aortic stenosis, chronic respiratory failure on 6 L of supplemental oxygen , renal cell carcinoma, breast cancer.  Patient presented secondary to low hemoglobin and elevated CO2 on BMP with associated confusion.  Patient with concern for acute on chronic respiratory failure with hypoxia and hypercapnia.  Patient started on BiPAP with improvement in symptoms.  Palliative consulted secondary to patient's underlying medical condition with concern for future prognosis.  Decision made for patient to discharge home with home hospice once medically ready for discharge.

## 2024-03-24 NOTE — Plan of Care (Signed)
 Patient admitted from the ED, patient lives at home with her husband, has children and a good support system, being admitted for shortness of breath, low hemoglobin and increase pCO2, mepilex applied to heels, back and sacrum due to patients weight. Plan of care and goals discussed with patient, time given for questions, currently patient is on BiPAP (see chart fr settings), care plans initiated and education completed, patient handbook/guide at bedside. Admission completed as much as possible.  Problem: Education: Goal: Knowledge of General Education information will improve Description: Including pain rating scale, medication(s)/side effects and non-pharmacologic comfort measures Outcome: Progressing   Problem: Health Behavior/Discharge Planning: Goal: Ability to manage health-related needs will improve Outcome: Progressing   Problem: Clinical Measurements: Goal: Ability to maintain clinical measurements within normal limits will improve Outcome: Progressing Goal: Will remain free from infection Outcome: Progressing Goal: Diagnostic test results will improve Outcome: Progressing Goal: Respiratory complications will improve Outcome: Progressing Goal: Cardiovascular complication will be avoided Outcome: Progressing   Problem: Activity: Goal: Risk for activity intolerance will decrease Outcome: Progressing   Problem: Nutrition: Goal: Adequate nutrition will be maintained Outcome: Progressing   Problem: Coping: Goal: Level of anxiety will decrease Outcome: Progressing   Problem: Elimination: Goal: Will not experience complications related to bowel motility Outcome: Progressing Goal: Will not experience complications related to urinary retention Outcome: Progressing   Problem: Pain Managment: Goal: General experience of comfort will improve and/or be controlled Outcome: Progressing   Problem: Safety: Goal: Ability to remain free from injury will improve Outcome:  Progressing   Problem: Skin Integrity: Goal: Risk for impaired skin integrity will decrease Outcome: Progressing   Problem: Education: Goal: Ability to demonstrate management of disease process will improve Outcome: Progressing Goal: Ability to verbalize understanding of medication therapies will improve Outcome: Progressing Goal: Individualized Educational Video(s) Outcome: Progressing   Problem: Activity: Goal: Capacity to carry out activities will improve Outcome: Progressing   Problem: Cardiac: Goal: Ability to achieve and maintain adequate cardiopulmonary perfusion will improve Outcome: Progressing

## 2024-03-24 NOTE — Plan of Care (Signed)
 Discussed with patient in front of family plan of care for the evening, pain management and bedtime medications with some teach back displayed.  Previous shift RN discussing Hospice at home.  Problem: Education: Goal: Knowledge of General Education information will improve Description: Including pain rating scale, medication(s)/side effects and non-pharmacologic comfort measures Outcome: Progressing   Problem: Health Behavior/Discharge Planning: Goal: Ability to manage health-related needs will improve Outcome: Progressing

## 2024-03-25 ENCOUNTER — Encounter: Payer: Self-pay | Admitting: Cardiology

## 2024-03-25 DIAGNOSIS — J9621 Acute and chronic respiratory failure with hypoxia: Secondary | ICD-10-CM | POA: Diagnosis not present

## 2024-03-25 DIAGNOSIS — I5032 Chronic diastolic (congestive) heart failure: Secondary | ICD-10-CM

## 2024-03-25 DIAGNOSIS — J9622 Acute and chronic respiratory failure with hypercapnia: Secondary | ICD-10-CM | POA: Diagnosis not present

## 2024-03-25 MED ORDER — HYDROXYZINE HCL 25 MG PO TABS
25.0000 mg | ORAL_TABLET | Freq: Three times a day (TID) | ORAL | Status: DC | PRN
  Administered 2024-03-26: 25 mg via ORAL
  Filled 2024-03-25: qty 1

## 2024-03-25 NOTE — Progress Notes (Signed)
 I followed up with Paula Ward for support.  She was feeling groggy today, but had some visitors earlier in the day that lifted her spirits.  She has an understanding of her medical state and is accepting of it.  I provided active and reflective listening.

## 2024-03-25 NOTE — Progress Notes (Addendum)
 PROGRESS NOTE    Paula Ward  ZOX:[id number] DOB: 10/06/1936 DOA: 03/23/2024 PCP: Adelia Homestead, MD   Brief Narrative: Paula Ward is a 88 y.o. female with a history of pulm hypertension, interstitial lung disease, restrictive lung disease secondary to history of disease/scleroderma, diastolic heart failure, CKD stage IIIb, aortic stenosis, chronic respiratory failure on 6 L of supplemental oxygen , renal cell carcinoma, breast cancer.  Patient presented secondary to low hemoglobin and elevated CO2 on BMP with associated confusion.  Patient with concern for acute on chronic respiratory failure with hypoxia and hypercapnia.  Patient started on BiPAP with improvement in symptoms.  Palliative consulted secondary to patient's underlying medical condition with concern for future prognosis.  Decision made for patient to discharge home with home hospice once medically ready for discharge.   Assessment and Plan:  Acute on chronic respite failure with hypoxia and hypercapnia Insetting of underlying interstitial lung disease/restrictive lung disease.  Patient follows with pulmonology as an outpatient.  VBG obtained on admission was significant for a venous pCO2 of 86 with normal venous pH of 7.37.  Patient was started on BiPAP with improvement. - Continue supplemental oxygen  - Continue BiPAP at night and as needed  Interstitial lung disease Restrictive lung disease Pulmonary artery hypertension Patient follows with pulmonology. Currently considered end stage. Palliative care consulted and decision made to transition to home with hospice care.  Acute metabolic encephalopathy Likely secondary to hypoxia/hypercapnia. Improved with BiPAP. Appears to be resolved.  Anemia of chronic disease Stable.  Acute on chronic diastolic heart failure Patient managed with Lasix  IV on admission. Appears to be stable.  CKD stage IIIb Stable.  Chronic pericardial effusion Noted.  Primary  hypertension Patient is managed on Cardizem  and spironolactone  as an outpatient which were held. -Continue Cardizem   Underweight Estimated body mass index is 17.89 kg/m as calculated from the following:   Height as of this encounter: 5\' 3"  (1.6 m).   Weight as of this encounter: 45.8 kg.    DVT prophylaxis: Lovenox  Code Status:   Code Status: Limited: Do not attempt resuscitation (DNR) -DNR-LIMITED -Do Not Intubate/DNI  Family Communication: None at bedside. Discussed with daughter at bedside Disposition Plan: Discharge home likely in 24 hours once equipment is delivered for home with hospice   Consultants:  Palliative care medicine  Procedures:  BiPAP  Antimicrobials: None    Subjective: Patient reports confusion last night. No issues this morning.  Objective: BP (!) 115/38   Pulse 77   Temp 98.7 F (37.1 C) (Oral)   Resp (!) 22   Ht 5\' 3"  (1.6 m)   Wt 45.8 kg   SpO2 99%   BMI 17.89 kg/m   Examination:  General exam: Appears calm and comfortable. Chronically ill appearing. Respiratory system: Clear to auscultation. Respiratory effort normal. Cardiovascular system: S1 & S2 heard, RRR. 2/6 systolic murmur Gastrointestinal system: Abdomen is nondistended, soft and nontender. Normal bowel sounds heard. Central nervous system: Alert and oriented. Musculoskeletal: No edema. No calf tenderness   Data Reviewed: I have personally reviewed following labs and imaging studies  CBC Lab Results  Component Value Date   WBC 6.4 03/24/2024   RBC 2.48 (L) 03/24/2024   HGB 8.9 (L) 03/24/2024   HCT 29.2 (L) 03/24/2024   MCV 117.7 (H) 03/24/2024   MCH 35.9 (H) 03/24/2024   PLT 121 (L) 03/24/2024   MCHC 30.5 03/24/2024   RDW 12.0 03/24/2024   LYMPHSABS 0.7 03/23/2024   MONOABS 0.3 03/23/2024  EOSABS 0.3 03/23/2024   BASOSABS 0.0 03/23/2024     Last metabolic panel Lab Results  Component Value Date   NA 141 03/24/2024   K 4.0 03/24/2024   CL 89 (L) 03/24/2024    CO2 40 (H) 03/24/2024   BUN 68 (H) 03/24/2024   CREATININE 1.28 (H) 03/24/2024   GLUCOSE 92 03/24/2024   GFRNONAA 41 (L) 03/24/2024   GFRAA >60 05/24/2018   CALCIUM  9.5 03/24/2024   PHOS 4.3 08/14/2023   PROT 7.1 03/23/2024   ALBUMIN 4.3 03/23/2024   LABGLOB 2.4 02/11/2023   BILITOT 0.6 03/23/2024   ALKPHOS 59 03/23/2024   AST 23 03/23/2024   ALT 11 03/23/2024   ANIONGAP 12 03/24/2024    GFR: Estimated Creatinine Clearance: 23.2 mL/min (A) (by C-G formula based on SCr of 1.28 mg/dL (H)).  Recent Results (from the past 240 hours)  Resp panel by RT-PCR (RSV, Flu A&B, Covid) Anterior Nasal Swab     Status: None   Collection Time: 03/23/24  6:34 PM   Specimen: Anterior Nasal Swab  Result Value Ref Range Status   SARS Coronavirus 2 by RT PCR NEGATIVE NEGATIVE Final    Comment: (NOTE) SARS-CoV-2 target nucleic acids are NOT DETECTED.  The SARS-CoV-2 RNA is generally detectable in upper respiratory specimens during the acute phase of infection. The lowest concentration of SARS-CoV-2 viral copies this assay can detect is 138 copies/mL. A negative result does not preclude SARS-Cov-2 infection and should not be used as the sole basis for treatment or other patient management decisions. A negative result may occur with  improper specimen collection/handling, submission of specimen other than nasopharyngeal swab, presence of viral mutation(s) within the areas targeted by this assay, and inadequate number of viral copies(<138 copies/mL). A negative result must be combined with clinical observations, patient history, and epidemiological information. The expected result is Negative.  Fact Sheet for Patients:  BloggerCourse.com  Fact Sheet for Healthcare Providers:  SeriousBroker.it  This test is no t yet approved or cleared by the [place]  FDA and  has been authorized for detection and/or diagnosis of SARS-CoV-2 by FDA under  an Emergency Use Authorization (EUA). This EUA will remain  in effect (meaning this test can be used) for the duration of the COVID-19 declaration under Section 564(b)(1) of the Act, 21 U.S.C.section 360bbb-3(b)(1), unless the authorization is terminated  or revoked sooner.       Influenza A by PCR NEGATIVE NEGATIVE Final   Influenza B by PCR NEGATIVE NEGATIVE Final    Comment: (NOTE) The Xpert Xpress SARS-CoV-2/FLU/RSV plus assay is intended as an aid in the diagnosis of influenza from Nasopharyngeal swab specimens and should not be used as a sole basis for treatment. Nasal washings and aspirates are unacceptable for Xpert Xpress SARS-CoV-2/FLU/RSV testing.  Fact Sheet for Patients: BloggerCourse.com  Fact Sheet for Healthcare Providers: SeriousBroker.it  This test is not yet approved or cleared by the [place]  FDA and has been authorized for detection and/or diagnosis of SARS-CoV-2 by FDA under an Emergency Use Authorization (EUA). This EUA will remain in effect (meaning this test can be used) for the duration of the COVID-19 declaration under Section 564(b)(1) of the Act, 21 U.S.C. section 360bbb-3(b)(1), unless the authorization is terminated or revoked.     Resp Syncytial Virus by PCR NEGATIVE NEGATIVE Final    Comment: (NOTE) Fact Sheet for Patients: BloggerCourse.com  Fact Sheet for Healthcare Providers: SeriousBroker.it  This test is not yet approved or cleared by the [place]   FDA and has been authorized for detection and/or diagnosis of SARS-CoV-2 by FDA under an Emergency Use Authorization (EUA). This EUA will remain in effect (meaning this test can be used) for the duration of the COVID-19 declaration under Section 564(b)(1) of the Act, 21 U.S.C. section 360bbb-3(b)(1), unless the authorization is terminated or revoked.  Performed at [facility], [address] [place], [place] [address]   MRSA Next Gen by PCR, Nasal     Status: None   Collection Time: 03/24/24 12:12 AM   Specimen: Nasal Mucosa; Nasal Swab  Result Value Ref Range Status   MRSA by PCR Next Gen NOT DETECTED NOT DETECTED Final    Comment: (NOTE) The GeneXpert MRSA Assay (FDA approved for NASAL specimens only), is one component of a comprehensive MRSA colonization surveillance program. It is not intended to diagnose MRSA infection nor to guide or monitor treatment for MRSA infections. Test performance is not FDA approved in patients less than 3 years old. Performed at [facility], [address] [place], [place] [address]       Radiology Studies: CT HEAD WO CONTRAST ( ) Result Date: 03/23/2024 CLINICAL DATA:  Shortness of breath with weakness and fatigue. EXAM: CT HEAD WITHOUT CONTRAST TECHNIQUE: Contiguous axial images were obtained from the base of the skull through the vertex without intravenous contrast. RADIATION DOSE REDUCTION: This exam was performed according to the departmental dose-optimization program which includes automated exposure control, adjustment of the mA and/or kV according to patient size and/or use of iterative reconstruction technique. COMPARISON:  October 10, 2018 FINDINGS: Brain: No evidence of acute infarction, hemorrhage, hydrocephalus, extra-axial collection or mass lesion/mass effect. There are areas of decreased attenuation within the white matter tracts of the supratentorial brain, consistent with microvascular disease changes. Vascular: Moderate to marked severity bilateral cavernous carotid artery calcification is noted. Skull: Normal. Negative for fracture or focal lesion. Sinuses/Orbits: No acute finding. Other: None. IMPRESSION: 1. No acute intracranial abnormality. 2. Generalized cerebral atrophy with chronic white matter small vessel ischemic changes. Electronically Signed   By: Virgle Grime M.D.   On: 03/23/2024 21:46   DG Chest Port 1 View Result Date: 03/23/2024 CLINICAL DATA:  Shortness of breath. EXAM: PORTABLE CHEST 1 VIEW COMPARISON:  12/04/2023 and CT chest 04/23/2022. FINDINGS: Trachea is midline. Globular and enlarged cardiopericardial silhouette, as on 12/04/2023. New mixed interstitial and airspace opacification. Left pleural effusion. IMPRESSION: 1. Congestive heart failure. 2. Cardiac enlargement with a large pericardial effusion, unchanged from 12/04/2023. Electronically Signed   By: Shearon Denis M.D.   On: 03/23/2024 17:58      LOS: 1 day    Aneita Keens, MD [facility] 03/25/2024, 2:41 PM   If 7PM-7AM, please contact night-coverage [email or web address]

## 2024-03-25 NOTE — TOC Transition Note (Signed)
 Transition of Care [facility]) - Discharge Note   Patient Details  Name: Paula Ward MRN: [id number] Date of Birth: 02/07/1936  Transition of [facility]) CM/SW Contact:  Tessie Fila, RN Phone Number: 03/25/2024, 3:15 PM   Clinical Narrative:    Pt will discharge home with hospice care through [facility]. NCM spoke with pt daughter and she states she will be setting up care in the home around the clock once she can no longer stay as [place]. Melissa with [facility] states equipment has been ordered and will be delivered to the home around 4pm. Pt will have a hospital bed, w/c, BSC, and NIV delivered. She is requesting that Bipap mask be sent home with pt from the hospital. Pt will be transported home by daughter at time of discharge. There are no other TOC needs at this time.   Final next level of care: Home w Hospice Care Barriers to Discharge: No Barriers Identified   Patient Goals and CMS Choice Patient states their goals for this hospitalization and ongoing recovery are:: To go home with hospice CMS Medicare.gov Compare Post Acute Care list provided to:: Patient Choice offered to / list presented to : Patient [place] ownership interest in [facility].provided to:: Patient    Discharge Placement                       Discharge Plan and Services Additional resources added to the After Visit Summary for   In-house Referral: Hospice / Palliative Care, Clinical Social Work Discharge Planning Services: CM Consult Post Acute Care Choice: Hospice          DME Arranged: NIV, Bedside commode, Wheelchair manual, Hospital bed DME Agency: Other - Comment Photographer Hospice) Date DME Agency Contacted: 03/25/24 Time DME Agency Contacted: 1130 Representative spoke with at DME Agency: Ardine Beckwith with [facility]   HH Agency: Other - See comment Photographer) Date HH Agency Contacted: 03/25/24 Time HH Agency Contacted:  1130 Representative spoke with at [facility] Agency: Ardine Beckwith- Civil engineer, contracting  Social Drivers of Health (SDOH) Interventions SDOH Screenings   Food Insecurity: No Food Insecurity (03/24/2024)  Housing: Low Risk  (03/24/2024)  Transportation Needs: No Transportation Needs (03/24/2024)  Utilities: Not At Risk (03/24/2024)  Alcohol Screen: Low Risk  (04/02/2022)  Depression (PHQ2-9): Low Risk  (02/05/2024)  Financial Resource Strain: Low Risk  (04/02/2022)  Physical Activity: Inactive (04/02/2022)  Social Connections: Socially Integrated (03/24/2024)  Stress: No Stress Concern Present (04/02/2022)  Tobacco Use: Low Risk  (03/23/2024)     Readmission Risk Interventions    03/24/2024    2:22 PM 10/12/2022    9:05 AM 03/18/2022    3:45 PM  Readmission Risk Prevention Plan  Transportation Screening Complete Complete Complete  PCP or Specialist Appt within 3-5 Days Complete  Complete  HRI or Home Care Consult Complete  Complete  Social Work Consult for Recovery Care Planning/Counseling Complete  Complete  Palliative Care Screening Complete  Not Applicable  Medication Review Oceanographer) Complete Complete Complete  PCP or Specialist appointment within 3-5 days of discharge  Complete   HRI or Home Care Consult  Complete   SW Recovery Care/Counseling Consult  Complete   Palliative Care Screening  Not Applicable   Skilled Nursing Facility  Complete

## 2024-03-25 NOTE — Progress Notes (Signed)
 Patient has a diagnosis of : Acute on chronic respiratory failure with hypoxia and hypercapnia (HCC) 03/23/2024  Her settings on the BIPAP are IPAP 16 EPAP 5 FIO2 40%, She wore her BIPAP at [facility] on March 25, 2024. She is currently on 2 lpm nasal cannula. Her SPO2 on 2 LPM is 94%. She is tolerating without any issues at this time.

## 2024-03-25 NOTE — Plan of Care (Signed)
  Problem: Clinical Measurements: Goal: Will remain free from infection Outcome: Progressing Goal: Respiratory complications will improve Outcome: Progressing Goal: Cardiovascular complication will be avoided Outcome: Progressing   Problem: Activity: Goal: Risk for activity intolerance will decrease Outcome: Progressing   Problem: Nutrition: Goal: Adequate nutrition will be maintained Outcome: Progressing   Problem: Coping: Goal: Level of anxiety will decrease Outcome: Progressing

## 2024-03-25 NOTE — Progress Notes (Signed)
 Daily Progress Note   Patient Name: Paula Ward       Date: 03/25/2024 DOB: 12-02-1935  Age: 88 y.o. MRN#: [id number] Attending Physician: Verlyn Goad, MD Primary Care Physician: Adelia Homestead, MD Admit Date: 03/23/2024 Length of Stay: 1 day  Reason for Consultation/Follow-up: Establishing goals of care  Subjective:   CC: Patient laying comfortably in bed.  Following up regarding complex medical decision making.  Subjective:  Reviewed recent documentation from hospitalist and TOC.  Family has elected for patient to return home with [facility] hospice support.  At time of EMR review in past 24 hours patient has received oxycodone  0.25 mg every 3 hours as needed x 2 doses.  Patient has also received as needed Ativan  0.25 mg x 1 dose.  Presented to bedside to see patient.  No family present at bedside.  Patient resting comfortably in bed eating lunch.  Inquired about symptom burden at this time.  Patient denies any symptoms of shortness of breath or anxiety.  Plan is for patient to go home with hospice once care coordinated.  Spent time providing emotional support via active listening.  Noted to have medicine to continue following patient's medical journey.  Review of Systems Patient feels anxiety improved Objective:   Vital Signs:  BP 116/66   Pulse 86   Temp 98.3 F (36.8 C) (Axillary)   Resp (!) 21   Ht 5\' 3"  (1.6 m)   Wt 45.8 kg   SpO2 95%   BMI 17.89 kg/m   Physical Exam: General: NAD, alert, chronically ill-appearing, cachectic, frail Cardiovascular: Tachycardia noted Respiratory: Slightly increased work of breathing noted, on nasal cannula O2 Neuro: Alert, interactive Psych: Calm  Assessment & Plan:   Assessment: Patient is an 88 year old female with a past medical history of pulmonary hypertension, ILD, restrictive lung disease secondary to CTD/scleroderma, chronic respiratory failure on 6 L nasal cannula, HFpEF, CKD stage IIIb, aortic stenosis, RCC and breast  cancer, and history of pericardial effusion who was sent from cancer center on 03/23/2024 for management of abnormal lab work.  Family had reported that patient over the past couple weeks has had increasing confusion, drowsiness, falling asleep on the telephone, and garbled speech.  Patient also had a fall on Monday prior to presentation where she hit the back of her head.  Since admission, patient has been managed for acute on chronic respiratory failure with hypoxia and hypercapnia secondary to ILD/restrictive lung disease from crest and scleroderma and PAH.  Palliative medicine team consulted to assist with complex medical decision making.   Recommendations/Plan: # Complex medical decision making/goals of care:    - Discussed care with patient at bedside as detailed above in HPI.  Had discussed care with patient with family present on 03/24/2024.  Discussed pathways for care moving forward knowing that patient does not have a lung disease that can be reversed.  Patient would like to be able to spend time at home with her family and not have to keep coming back to the hospital end so decision was made to get patient home with hospice support.  Family chose [facility] hospice.  Planning for discharge home when care coordinated and medically appropriate.                -  Code Status: Limited: Do not attempt resuscitation (DNR) -DNR-LIMITED -Do Not Intubate/DNI     # Symptom management Patient is receiving these palliative interventions for symptom management with an intent to improve quality of life.                 -  Shortness of breath, in setting of severe restrictive lung disease and chronic respiratory failure                               - Continue 0.25 mg oxycodone  solution every 3 hours as needed                               - Continue senna 1 tab twice daily while patient receiving opioids                  - Anxiety                               - Continue oral Ativan  solution 0.26 mg every 4 hours as  needed   # Psycho-social/Spiritual Support:  - Support System: Husband, son, daughter-in-law - Engineer, site for spiritual support   # Discharge Planning:  Home with Hospice  Thank you for allowing the palliative care team to participate in the care Belenda Bowie.  Barnett Libel, DO Palliative Care Provider PMT # [phone]  If patient remains symptomatic despite maximum doses, please call PMT at [phone] between 0700 and 1900. Outside of these hours, please call attending, as PMT does not have night coverage.

## 2024-03-26 ENCOUNTER — Other Ambulatory Visit (HOSPITAL_COMMUNITY): Payer: Self-pay

## 2024-03-26 DIAGNOSIS — J9622 Acute and chronic respiratory failure with hypercapnia: Secondary | ICD-10-CM | POA: Diagnosis not present

## 2024-03-26 DIAGNOSIS — J9621 Acute and chronic respiratory failure with hypoxia: Secondary | ICD-10-CM | POA: Diagnosis not present

## 2024-03-26 LAB — METHYLMALONIC ACID, SERUM: Methylmalonic Acid, Quantitative: 947 nmol/L — ABNORMAL HIGH (ref 0–378)

## 2024-03-26 MED ORDER — HYDROXYZINE HCL 25 MG PO TABS
25.0000 mg | ORAL_TABLET | Freq: Three times a day (TID) | ORAL | 0 refills | Status: DC | PRN
  Filled 2024-03-26: qty 30, 10d supply, fill #0

## 2024-03-26 MED ORDER — FAMOTIDINE 20 MG PO TABS: 10.0000 mg | ORAL_TABLET | Freq: Every day | ORAL | Status: DC

## 2024-03-26 MED ORDER — OXYCODONE HCL 5 MG/5ML PO SOLN
ORAL | 0 refills | Status: DC
  Filled 2024-03-26: qty 15, 7d supply, fill #0

## 2024-03-26 NOTE — Discharge Summary (Signed)
 Physician Discharge Summary   Patient: Paula Ward MRN: [id number] DOB: 1936/06/24  Admit date:     03/23/2024  Discharge date: 03/26/24  Discharge Physician: Aneita Keens, MD   PCP: Adelia Homestead, MD   Recommendations at discharge:  Home with hospice  Discharge Diagnoses: Principal Problem:   Acute on chronic respiratory failure with hypoxia and hypercapnia (HCC) Active Problems:   Acute metabolic encephalopathy   Anemia, chronic disease   Chronic diastolic heart failure (HCC)   Chronic kidney disease, stage 3b (HCC)   Chronic pericardial effusion   Essential hypertension   GASTROESOPHAGEAL REFLUX DISEASE   Other secondary pulmonary hypertension (HCC)   ILD (interstitial lung disease) (HCC)   Nonrheumatic aortic valve stenosis   DNR (do not resuscitate)   Acute respiratory failure with hypoxia and hypercarbia (HCC)   Medication management   Counseling and coordination of care   Goals of care, counseling/discussion   Palliative care encounter  Resolved Problems:   * No resolved hospital problems. *  Hospital Course: Paula Ward is a 88 y.o. female with a history of pulm hypertension, interstitial lung disease, restrictive lung disease secondary to history of disease/scleroderma, diastolic heart failure, CKD stage IIIb, aortic stenosis, chronic respiratory failure on 6 L of supplemental oxygen , renal cell carcinoma, breast cancer.  Patient presented secondary to low hemoglobin and elevated CO2 on BMP with associated confusion.  Patient with concern for acute on chronic respiratory failure with hypoxia and hypercapnia.  Patient started on BiPAP with improvement in symptoms.  Palliative consulted secondary to patient's underlying medical condition with concern for future prognosis.  Decision made for patient to discharge home with home hospice once medically ready for discharge.  Assessment and Plan:  Acute on chronic respite failure with hypoxia and  hypercapnia Insetting of underlying interstitial lung disease/restrictive lung disease.  Patient follows with pulmonology as an outpatient.  VBG obtained on admission was significant for a venous pCO2 of 86 with normal venous pH of 7.37.  Patient was started on BiPAP with improvement. Patient set up with NIV at home for discharge. Continue supplemental oxygen  on discharge.   Interstitial lung disease Restrictive lung disease Pulmonary artery hypertension Patient follows with pulmonology. Currently considered end stage. Palliative care consulted and decision made to transition to home with hospice care.   Acute metabolic encephalopathy Likely secondary to hypoxia/hypercapnia. Improved with BiPAP. Appears to be resolved.   Anemia of chronic disease Stable.   Acute on chronic diastolic heart failure Patient managed with Lasix  IV on admission. Appears to be stable.   CKD stage IIIb Stable.   Chronic pericardial effusion Noted.   Primary hypertension Patient is managed on Cardizem  and spironolactone  as an outpatient which were held. Continue medications on discharge.   Underweight Estimated body mass index is 17.73 kg/m as calculated from the following:   Height as of this encounter: 5\' 3"  (1.6 m).   Weight as of this encounter: 45.4 kg.   Consultants:  Palliative care medicine   Procedures:  BiPAP  Disposition: Home with hospice Diet recommendation: Regular diet  DISCHARGE MEDICATION: Allergies as of 03/26/2024       Reactions   Codeine  Nausea Only   Hallucinations, too   Other Nausea And Vomiting, Other (See Comments)   "-mycin" antibiotics.   Also cause hallucinations.   Erythromycin Nausea And Vomiting   Iodinated Contrast Media Other (See Comments)   Renal issues   Lisinopril Other (See Comments)   Pt doesn't remember reaction  Mirtazapine  Other (See Comments)   "Threw me into orbit"   Mycophenolate Mofetil Nausea Only   Made me nervous   Sulfa  Antibiotics  Other (See Comments)   Unknown reaction        Medication List     STOP taking these medications    ipratropium 0.03 % nasal spray Commonly known as: ATROVENT    oseltamivir  30 MG capsule Commonly known as: Tamiflu        TAKE these medications    acetaminophen  325 MG tablet Commonly known as: Tylenol  Take 2 tablets (650 mg total) by mouth every 6 (six) hours as needed for moderate pain.   albuterol  108 (90 Base) MCG/ACT inhaler Commonly known as: VENTOLIN  HFA INHALE 2 PUFFS INTO THE LUNGS EVERY 6 HOURS AS NEEDED FOR WHEEZING OR SHORTNESS OF BREATH   albuterol  (2.5 MG/3ML) 0.083% nebulizer solution Commonly known as: PROVENTIL  Take 3 mLs (2.5 mg total) by nebulization every 6 (six) hours as needed for wheezing or shortness of breath.   arformoterol  15 MCG/2ML Nebu Commonly known as: BROVANA  Take 2 mLs (15 mcg total) by nebulization 2 (two) times daily.   augmented betamethasone dipropionate 0.05 % cream Commonly known as: DIPROLENE-AF Apply 1 application  topically 2 (two) times daily as needed (for irritation).   clobetasol ointment 0.05 % Commonly known as: TEMOVATE Apply 1 Application topically 2 (two) times daily as needed (for irritation).   diltiazem  30 MG tablet Commonly known as: CARDIZEM  Take 1 tablet (30 mg total) by mouth every 12 (twelve) hours.   docusate sodium  100 MG capsule Commonly known as: COLACE Take 100 mg by mouth daily.   empagliflozin  10 MG Tabs tablet Commonly known as: JARDIANCE  Take 1 tablet (10 mg total) by mouth daily.   Ensure High Protein Liqd Take 0.5 Bottles by mouth daily as needed (for supplementation).   famotidine  20 MG tablet Commonly known as: PEPCID  Take 0.5 tablets (10 mg total) by mouth at bedtime. What changed: how much to take   fluticasone  50 MCG/ACT nasal spray Commonly known as: FLONASE  USE 1 SPRAY(S) IN EACH NOSTRIL ONCE DAILY AS NEEDED FOR ALLERGIES   furosemide  20 MG tablet Commonly known as:  LASIX  Take 2 tablets ( 40 mg ) every morning may take extra 2 tablets for weight gain of 3 lbs   hydrOXYzine  25 MG tablet Commonly known as: ATARAX  Take 1 tablet (25 mg total) by mouth 3 (three) times daily as needed for anxiety.   latanoprost  0.005 % ophthalmic solution Commonly known as: XALATAN  Place 1 drop into both eyes at bedtime.   magic mouthwash Soln Take 10 mLs by mouth 4 (four) times daily as needed for mouth pain.   multivitamin with minerals Tabs tablet Take 1 tablet by mouth daily.   oxyCODONE  5 MG/5ML solution Commonly known as: ROXICODONE  Take 0.25 mLs (0.25 mg total) by mouth every 3 (three) hours as needed for moderate pain (pain score 4-6) or severe pain (pain score 7-10) (shortness of breath).   OXYGEN  Inhale 6 L/min into the lungs continuous.   pantoprazole  40 MG tablet Commonly known as: PROTONIX  TAKE 1 TABLET BY MOUTH TWICE DAILY BEFORE A MEAL   spironolactone  25 MG tablet Commonly known as: ALDACTONE  Take 1/2 tablet ( 12.5 mg ) every morning   sucralfate  1 GM/10ML suspension Commonly known as: CARAFATE  TAKE  10 ML BY MOUTH EVERY 6 HOURS AS NEEDED FOR REFLUX   triamcinolone  cream 0.1 % Commonly known as: KENALOG  Apply 1 Application topically See admin instructions.  Apply to affected leg(s) after showers and up to 2 additional times a day as needed for irritation   Yupelri  175 MCG/3ML nebulizer solution Generic drug: revefenacin  Take 3 mLs (175 mcg total) by nebulization daily.        Follow-up Information     Adelia Homestead, MD Follow up.   Specialty: Internal Medicine Why: As needed Contact information: [address] [place] [address] [phone]                Discharge Exam: BP 130/67   Pulse 95   Temp 99 F (37.2 C) (Oral)   Resp (!) 28   Ht 5\' 3"  (1.6 m)   Wt 45.4 kg   SpO2 100%   BMI 17.73 kg/m   General exam: Appears calm and comfortable. Cachectic  Respiratory system: Diminished. Respiratory  effort normal. Cardiovascular system: S1 & S2 heard, RRR. Gastrointestinal system: Abdomen is nondistended, soft and nontender. Normal bowel sounds heard. Central nervous system: Alert and oriented. Musculoskeletal: No edema. No calf tenderness. Large left sided chest wall mass Psychiatry: Judgement and insight appear normal. Mood & affect appropriate.   Condition at discharge: stable  The results of significant diagnostics from this hospitalization (including imaging, microbiology, ancillary and laboratory) are listed below for reference.   Imaging Studies: CT HEAD WO CONTRAST ( ) Result Date: 03/23/2024 CLINICAL DATA:  Shortness of breath with weakness and fatigue. EXAM: CT HEAD WITHOUT CONTRAST TECHNIQUE: Contiguous axial images were obtained from the base of the skull through the vertex without intravenous contrast. RADIATION DOSE REDUCTION: This exam was performed according to the departmental dose-optimization program which includes automated exposure control, adjustment of the mA and/or kV according to patient size and/or use of iterative reconstruction technique. COMPARISON:  October 10, 2018 FINDINGS: Brain: No evidence of acute infarction, hemorrhage, hydrocephalus, extra-axial collection or mass lesion/mass effect. There are areas of decreased attenuation within the white matter tracts of the supratentorial brain, consistent with microvascular disease changes. Vascular: Moderate to marked severity bilateral cavernous carotid artery calcification is noted. Skull: Normal. Negative for fracture or focal lesion. Sinuses/Orbits: No acute finding. Other: None. IMPRESSION: 1. No acute intracranial abnormality. 2. Generalized cerebral atrophy with chronic white matter small vessel ischemic changes. Electronically Signed   By: Virgle Grime M.D.   On: 03/23/2024 21:46   DG Chest Port 1 View Result Date: 03/23/2024 CLINICAL DATA:  Shortness of breath. EXAM: PORTABLE CHEST 1 VIEW COMPARISON:   12/04/2023 and CT chest 04/23/2022. FINDINGS: Trachea is midline. Globular and enlarged cardiopericardial silhouette, as on 12/04/2023. New mixed interstitial and airspace opacification. Left pleural effusion. IMPRESSION: 1. Congestive heart failure. 2. Cardiac enlargement with a large pericardial effusion, unchanged from 12/04/2023. Electronically Signed   By: Shearon Denis M.D.   On: 03/23/2024 17:58   US  Abdomen Limited RUQ (LIVER/GB) Result Date: 03/09/2024 CLINICAL DATA:  evaluate for liver disease EXAM: ULTRASOUND ABDOMEN LIMITED COMPARISON:  None Available. FINDINGS: The liver demonstrates diffusely coarsened echotexture without focal hepatic parenchymal lesions or intrahepatic ductal dilatation. The gallbladder demonstrates no stones, wall thickening or pericholecystic fluid. CBD measured 0.4cm. Pericardial effusion noted as an incidental finding. IMPRESSION: 1. Coarsened hepatic echotexture. 2. Pericardial effusion. Electronically Signed   By: Sydell Eva M.D.   On: 03/09/2024 11:41    Microbiology: Results for orders placed or performed during the hospital encounter of 03/23/24  Resp panel by RT-PCR (RSV, Flu A&B, Covid) Anterior Nasal Swab     Status: None   Collection Time: 03/23/24  6:34 PM   Specimen: Anterior Nasal Swab  Result Value Ref Range Status   SARS Coronavirus 2 by RT PCR NEGATIVE NEGATIVE Final    Comment: (NOTE) SARS-CoV-2 target nucleic acids are NOT DETECTED.  The SARS-CoV-2 RNA is generally detectable in upper respiratory specimens during the acute phase of infection. The lowest concentration of SARS-CoV-2 viral copies this assay can detect is 138 copies/mL. A negative result does not preclude SARS-Cov-2 infection and should not be used as the sole basis for treatment or other patient management decisions. A negative result may occur with  improper specimen collection/handling, submission of specimen other than nasopharyngeal swab, presence of viral  mutation(s) within the areas targeted by this assay, and inadequate number of viral copies(<138 copies/mL). A negative result must be combined with clinical observations, patient history, and epidemiological information. The expected result is Negative.  Fact Sheet for Patients:  BloggerCourse.com  Fact Sheet for Healthcare Providers:  SeriousBroker.it  This test is no t yet approved or cleared by the [place]  FDA and  has been authorized for detection and/or diagnosis of SARS-CoV-2 by FDA under an Emergency Use Authorization (EUA). This EUA will remain  in effect (meaning this test can be used) for the duration of the COVID-19 declaration under Section 564(b)(1) of the Act, 21 U.S.C.section 360bbb-3(b)(1), unless the authorization is terminated  or revoked sooner.       Influenza A by PCR NEGATIVE NEGATIVE Final   Influenza B by PCR NEGATIVE NEGATIVE Final    Comment: (NOTE) The Xpert Xpress SARS-CoV-2/FLU/RSV plus assay is intended as an aid in the diagnosis of influenza from Nasopharyngeal swab specimens and should not be used as a sole basis for treatment. Nasal washings and aspirates are unacceptable for Xpert Xpress SARS-CoV-2/FLU/RSV testing.  Fact Sheet for Patients: BloggerCourse.com  Fact Sheet for Healthcare Providers: SeriousBroker.it  This test is not yet approved or cleared by the [place]  FDA and has been authorized for detection and/or diagnosis of SARS-CoV-2 by FDA under an Emergency Use Authorization (EUA). This EUA will remain in effect (meaning this test can be used) for the duration of the COVID-19 declaration under Section 564(b)(1) of the Act, 21 U.S.C. section 360bbb-3(b)(1), unless the authorization is terminated or revoked.     Resp Syncytial Virus by PCR NEGATIVE NEGATIVE Final    Comment: (NOTE) Fact Sheet for  Patients: BloggerCourse.com  Fact Sheet for Healthcare Providers: SeriousBroker.it  This test is not yet approved or cleared by the [place]  FDA and has been authorized for detection and/or diagnosis of SARS-CoV-2 by FDA under an Emergency Use Authorization (EUA). This EUA will remain in effect (meaning this test can be used) for the duration of the COVID-19 declaration under Section 564(b)(1) of the Act, 21 U.S.C. section 360bbb-3(b)(1), unless the authorization is terminated or revoked.  Performed at [facility], [address] [place], [place] [address]   MRSA Next Gen by PCR, Nasal     Status: None   Collection Time: 03/24/24 12:12 AM   Specimen: Nasal Mucosa; Nasal Swab  Result Value Ref Range Status   MRSA by PCR Next Gen NOT DETECTED NOT DETECTED Final    Comment: (NOTE) The GeneXpert MRSA Assay (FDA approved for NASAL specimens only), is one component of a comprehensive MRSA colonization surveillance program. It is not intended to diagnose MRSA infection nor to guide or monitor treatment for MRSA infections. Test performance is not FDA approved in patients less than 32 years old. Performed at [facility]  [facility], [address], [place], [place] [address]    *Note: Due to a large number of results and/or encounters for the requested time period, some results have not been displayed. A complete set of results can be found in Results Review.    Labs: CBC: Recent Labs  Lab 03/23/24 1439 03/23/24 1730 03/24/24 0311  WBC 5.4 5.5 6.4  NEUTROABS 3.9 4.1  --   HGB 8.8* 9.0* 8.9*  HCT 28.8* 30.2* 29.2*  MCV 116.1* 120.3* 117.7*  PLT 118* 123* 121*   Basic Metabolic Panel: Recent Labs  Lab 03/23/24 1439 03/23/24 1730 03/23/24 2023 03/24/24 0311  NA 141 141  --  141  K 4.7 4.3  --  4.0  CL 91* 91*  --  89*  CO2 >45* 39*  --  40*  GLUCOSE 99 99  --  92  BUN 72* 71*  --  68*   CREATININE 1.56* 1.43*  --  1.28*  CALCIUM  9.6 9.4  --  9.5  MG  --   --  2.0  --    Liver Function Tests: Recent Labs  Lab 03/23/24 1439  AST 23  ALT 11  ALKPHOS 59  BILITOT 0.6  PROT 7.1  ALBUMIN 4.3    Discharge time spent: 35 minutes.  Signed: Aneita Keens, MD [facility] 03/26/2024

## 2024-03-26 NOTE — Plan of Care (Signed)

## 2024-03-26 NOTE — Progress Notes (Signed)
 Melode[facility] Spruce [facility] [address] [facility] Liaison Note   Received request from PMT, for hospice services at home after discharge. [facility] HLT spoke with family to initiate education related to hospice philosophy, services, and team approach to care. Patient/family verbalized understanding of information given. Per discussion, the plan is for discharge home by private vehicle on today.   DME needs discussed. Patient has the following equipment in the home:  O2 and NIV previously delivered to patient home. Spoke with [facility] and confirmed that 2 masks for NIV will be delivered to the home today as well.   The address has been verified and is correct in the chart. Spoke with daughter Ardean Kohl to confirm DME delivery.     Please send signed and completed DNR home with patient/family. Please provide prescriptions at discharge as needed to ensure ongoing symptom management.   [facility] information and contact numbers given to daughter Louanne.  Above information shared with [facility] Transitions of Care Manager.   Please call with any questions or concerns.   Thank you for the opportunity to participate in this patient's care.   Jacqlyn Matas, BSN, [facility] Liaison [phone]

## 2024-03-26 NOTE — Progress Notes (Signed)
 Removed patient from BiPAP (ordered HS). PT is awake, alert and oriented. PT placed on 4 LPM nasal cannula as used at home per patient and receiving nebulizer treatment. RN aware.

## 2024-03-26 NOTE — Discharge Instructions (Signed)
 Paula Ward,  You were in the hospital with some confusion. This appears to be related to a high carbon dioxide level. You have improved with using BiPAP at night time. You have been enrolled in hospice and equipment has been provided for you at home to help keep you from needing to come back to the hospital.

## 2024-03-28 ENCOUNTER — Telehealth: Payer: Self-pay | Admitting: Pulmonary Disease

## 2024-03-28 NOTE — Telephone Encounter (Signed)
 CMN received on06/09/25 for oxygen  system

## 2024-03-29 ENCOUNTER — Ambulatory Visit: Payer: Self-pay

## 2024-03-29 ENCOUNTER — Encounter: Admitting: Internal Medicine

## 2024-03-29 ENCOUNTER — Other Ambulatory Visit: Payer: Self-pay | Admitting: Gastroenterology

## 2024-03-29 ENCOUNTER — Telehealth: Payer: Self-pay | Admitting: Internal Medicine

## 2024-03-29 NOTE — Telephone Encounter (Signed)
 Copied from CRM [id number]. Topic: Clinical - Medical Advice >> Mar 28, 2024  3:16 PM Ambrose Junk wrote: Reason for CRM:  Daughter Paula Ward, [phone]. Questions regarding palliative care. States patient needs order for BiPap machine, and supplies (small mask). Currently uses [facility] for her specialty orders. Please call. >> Mar 29, 2024  8:57 AM Paula Ward wrote: Paula Ward requesting Ward call back with information and an update when the order has been sent.   Spoke with patient's daughter Paula Ward [phone] regarding prior message. Advised Paula Ward Dr.Hunsucker is not in the office this week and will not return to the office until around 04/19/2024. Patient is going to need Ward order for BIPAP sent into [facility]. Patient was in the hospital and was given Ward BIPAP but patient is on hospice. Auston Left I will see if another provider can sign for patient to get Ward BIPAP.  Advised Paula Ward I will contact her in the morning .  Paula Ward's voice was understanding.Nothing else further needed.

## 2024-03-29 NOTE — Telephone Encounter (Signed)
 This RN attempted to contact daughter of patient for triage. No answer, voicemail left requesting return call to clinic.

## 2024-03-29 NOTE — Telephone Encounter (Signed)
 Copied from CRM [id number]. Topic: Clinical - Medical Advice >> Mar 29, 2024 11:29 AM Dimple Francis wrote: Reason for CRM: Bertell Broach #[phone]  [facility]- Following patient for palliative care

## 2024-03-29 NOTE — Telephone Encounter (Signed)
 Copied from CRM [id number]. Topic: Clinical - Medical Advice >> Mar 29, 2024  9:56 AM Marlan Silva wrote: Reason for CRM: Patients daughter Tere Felts states that her mother was hospitalized and discharged. She states that the patients pulmonologist has not returned her call. She is requesting an order for a bypass machine, she is requesting a call back at [phone] for further explanation of the situation. Patients daughter is not listed as DPR but the patient is with the daughter currently. >> Mar 29, 2024 12:07 PM Loreda Rodriguez T wrote: patients daughter Lavonna Prader returned NT call to have the bipap ordered through [facility] for insurance coverage. Patient was sent home under Hospice care with a bipap machine given through [facility] but patient later found out she would lose her [employer] benefits so she declined Hospice. [facility] is now saying they would need to come pick up the bipap machine and patient really need it. Please f/u with daughter Bernerd Bright

## 2024-03-29 NOTE — Telephone Encounter (Signed)
 Copied from CRM [id number]. Topic: Clinical - Medical Advice >> Mar 28, 2024  3:16 PM Ambrose Junk wrote: Reason for CRM:  Daughter Virgle Grime, [phone]. Questions regarding palliative care. States patient needs order for BiPap machine, and supplies (small mask). Currently uses [facility] for her specialty orders. Please call. >> Mar 29, 2024  8:57 AM [name] wrote: LouAnn requesting a call back with information and an update when the order has been sent.   ATC x1 unable to leave a Voicemail on daughter's (luann) phone

## 2024-03-29 NOTE — Telephone Encounter (Signed)
 Copied from CRM [id number]. Topic: Clinical - Medical Advice >> Mar 28, 2024  3:16 PM Ambrose Junk wrote: Reason for CRM:  Daughter Virgle Grime, [phone]. Questions regarding palliative care. States patient needs order for BiPap machine, and supplies (small mask). Currently uses [facility] for her specialty orders. Please call. >> Mar 29, 2024  8:57 AM [name] wrote: LouAnn requesting a call back with information and an update when the order has been sent.   ATC x2 unable to leave a voicemail on daughter's phone.

## 2024-03-30 NOTE — Telephone Encounter (Signed)
 Spoke with patient's daughter Lavonna Prader regarding prior message. Let Luann know I have placed a order in for a NIV and supplies and small face mask for patient ok per Alston Jerry.  Luann's voice was understanding. Nothing else further needed.

## 2024-03-30 NOTE — Telephone Encounter (Signed)
 Please advise if can as MD is out of office

## 2024-03-30 NOTE — Telephone Encounter (Signed)
 It shouldn't be a BiPAP. It should be a NIV. Please place order for NIV with [facility]  The patient continues to exhibit signs of hypercapnea associated with chronic respiratory failure secondary to severe COPD and chronic hypercarbic respiratory failure. Interruption or failure to provide NIV would quickly lead to exacerbation of the patient's condition, hospital readmission, and likely harm the patient. Continued use is preferred. The use of the NIV will treat the patient's PCO2 levels and can reduce the risk of exacerbations and future hospitalizations when used at night and during the day. Bilevel/RAD therapy with and without a rate would be ineffective as the patient requires a volume targeted mode. Ventilation is required to decrease the work of breathing and improve pulmonary status. Interruption of ventilator support would lead to a decline of health status. Patient is able to protect their airways and clear secretions on their own.

## 2024-03-30 NOTE — Telephone Encounter (Signed)
 So was she discharged home with it, or was it just used when she was first admitted? It looks like per the discharge summary, she was supposed to go home with NIV and hospice. Is she on hospice now?

## 2024-03-30 NOTE — Telephone Encounter (Signed)
Patient has been seen in the ER 

## 2024-03-30 NOTE — Telephone Encounter (Signed)
 Yes she was discharged home with NIV Machine from the hospital. Patient has refused hospice and [facility] gave her the BIPAP machine to use until she gets a BIPAP machine from [facility] [employer] [facility] can you please advise .

## 2024-03-30 NOTE — Telephone Encounter (Signed)
 Copied from CRM [id number]. Topic: Clinical - Medical Advice >> Mar 28, 2024  3:16 PM Ambrose Junk wrote: Reason for CRM:  Daughter Virgle Grime, [phone]. Questions regarding palliative care. States patient needs order for BiPap machine, and supplies (small mask). Currently uses [facility] for her specialty orders. Please call. >> Mar 29, 2024  8:57 AM [name] wrote: Paula Ward requesting a call back with information and an update when the order has been sent.   Alston Jerry will you be willing to put a order in for patient for BIPAP sent to [facility]. Patient was in the hospital and they placed a order for BIPAP but its only temp and Dr.Hunsucker is out of the office this week . Alston Jerry can you please advise   Thank you

## 2024-03-31 ENCOUNTER — Telehealth: Payer: Self-pay | Admitting: Nurse Practitioner

## 2024-03-31 NOTE — Telephone Encounter (Signed)
 Per Anselmo Kings at [place] I just spoke to [place] at [facility]. The patient is under palliative care, she is not Hospice at this time. We will be able to proceed with the [facility].  Just keeping you updated

## 2024-03-31 NOTE — Telephone Encounter (Signed)
 This is what I had asked about yesterday but Tanya Fantasia said the patient has refused hospice

## 2024-03-31 NOTE — Telephone Encounter (Signed)
 From [facility]- I have called and left a message for [facility] to see if they are providing the NIV or if they are going to contract with [facility] for the NIV. We usually do not service patient's once they have gone Hospice unless the hospice will contract for the patient to keep equipment with us .   I will advise you on what is determined

## 2024-03-31 NOTE — Telephone Encounter (Signed)
 NIV settings are typically set/titrated by RT.  Appropriate to start at these settings. RT to titrate as needed

## 2024-03-31 NOTE — Telephone Encounter (Signed)
 Okay BiPAP machine.    Did she leave the hospital with the BiPAP machine?  If yes, what are the settings?    It would be best still to contact her pulmonologist.  Thanks

## 2024-03-31 NOTE — Telephone Encounter (Signed)
 Per Terri at [facility], Terri  [place], [place]; [place], [place]; [place], [place]; [place], [place] General vent settings for our machines are AE MODE @ Vt=300-600, PSmin=5, PSMAX=26. BR=AUTO, EPAPmin=4, EPAP max=15, INSPIRATORY TIME=1-3, VOLUME SPEED=1-3, PEEP=5, WITH A MAX Pressure=35. ARS, Please let us  know if the provider gives verbal consent to this and we will fax over order form.  Please advise.

## 2024-03-31 NOTE — Telephone Encounter (Signed)
 I have asked Terri at [facility] to inform me as to what she hears. I will let you know as soon as I hear back

## 2024-04-01 ENCOUNTER — Telehealth: Payer: Self-pay | Admitting: Internal Medicine

## 2024-04-01 DIAGNOSIS — I509 Heart failure, unspecified: Secondary | ICD-10-CM | POA: Diagnosis not present

## 2024-04-01 NOTE — Telephone Encounter (Signed)
 Copied from CRM [id number]. Topic: General - Other >> Apr 01, 2024 11:42 AM Trula Gable C wrote: Reason for CRM: Patient called in stating she has standing order where she comes in to get her labs check, wanted to know if she is able to come In today to do that  ---  Called pt and let her know there are no open lab orders, and that Dr. Nicolette Barrio is out next week.   Please add lab orders if appropriate and let pt know.   TDW

## 2024-04-04 ENCOUNTER — Telehealth: Payer: Self-pay | Admitting: Cardiology

## 2024-04-04 ENCOUNTER — Telehealth: Payer: Self-pay | Admitting: Nurse Practitioner

## 2024-04-04 ENCOUNTER — Telehealth: Payer: Self-pay | Admitting: Internal Medicine

## 2024-04-04 ENCOUNTER — Telehealth: Payer: Self-pay

## 2024-04-04 DIAGNOSIS — I5033 Acute on chronic diastolic (congestive) heart failure: Secondary | ICD-10-CM

## 2024-04-04 NOTE — Telephone Encounter (Signed)
Left message for patient with Dr Creshaw's recommendations.   Lab orders mailed to the pt  

## 2024-04-04 NOTE — Telephone Encounter (Signed)
 Spoke with patient and she states she feels she has fluid building but does not have any swelling on her legs or ankles. She is not sure if abdomen has swelling. Its not hard to touch or painful. Denies any chest pain or SOB.  She took 60 mg of lasix  yesterday and weight still increased today. She has not taken any lasix  today.   Advised patient she can take 2 extra tablets this morning along with her scheduled 2 tablets. Will forward to provider  6/13 97.4 6/14 96.8 6/15 98.7 6/16 102.2

## 2024-04-04 NOTE — Telephone Encounter (Signed)
 Copied from CRM [id number]. Topic: Clinical - Order For Equipment >> Apr 04, 2024 12:00 PM Taleah C wrote: Reason for CRM: pt called back to add that she needs a hospital bed before Wednesday provided from [facility]. She needs approval from Dr. Nicolette Barrio. She asked for callback at [phone]

## 2024-04-04 NOTE — Telephone Encounter (Signed)
 Pt c/o swelling/edema: STAT if pt has developed SOB within 24 hours  If swelling, where is the swelling located? Legs,stomach   How much weight have you gained and in what time span? 2 pds per day for the last 3 days  Have you gained 2 pounds in a day or 5 pounds in a week? yes Do you have a log of your daily weights (if so, list)?   Are you currently taking a fluid pill? yes  Are you currently SOB? yes  Have you traveled recently in a car or plane for an extended period of time?  no

## 2024-04-04 NOTE — Telephone Encounter (Signed)
 Copied from CRM [id number]. Topic: Clinical - Order For Equipment >> Apr 04, 2024  9:13 AM Tyronne Galloway wrote: Reason for CRM: Pt's daughter Bernerd Bright called to check on mother's BPAP machine as they haven't heard of any updates. I saw an encounter stating Gurney Lefort talked with Terri at [place] who spoke with Melissa at [facility] stating the patient is under palliative care, she is not Hospice at this time, and had the okay to proceed with the [facility]. [facility] is wanting to pick up their equipment and the pt needs a BPAP machine urgently. Pt's requesting an update please call at [phone]. [phone] is [employer] phone number.   PCC's, please advise. Thanks

## 2024-04-04 NOTE — Telephone Encounter (Signed)
 Per Terri at [place]- We still have to have the verbal order from the provider to be  able to send the Rx. The patient also needs to be seen and the NIV discussed as well as ABGs to be completed. This is per insurance to show medical necessity. We can not process the order without these. I can fax over the [facility] order form along with the how to and requirements that insurance is looking for. What fax should I send it to?  I have given them the fax number.

## 2024-04-04 NOTE — Telephone Encounter (Signed)
 Copied from CRM [id number]. Topic: Clinical - Order For Equipment >> Apr 04, 2024 11:44 AM Corean Deutscher wrote: Reason for CRM: Patient called regarding BPAP readings. Patient stated she needs the readings sent to [facility] as [facility] will be to her house on Wednesday. Patient expressed no other needs at this time.

## 2024-04-04 NOTE — Telephone Encounter (Signed)
**Note De-identified  [place] Obfuscation** Please advise 

## 2024-04-04 NOTE — Telephone Encounter (Signed)
 I was asked to sign this because Dr. Marygrace Snellen was out.  Paula Ward had said that she had a NIV from her recent discharge. This was supposed to be an order to continue therapy because since she was transitioned off Hospice, [facility] is no longer covered. There has been a lot of back and forth on this. It doesn't seem [facility] actually knows what information they need or this isn't being communicated.  She has an appt 7/14 so this can be addressed then and she will have to continue utilizing the NIV she currently has, if [facility] is refusing to replace the current machine. We can fax the most recent blood gas results but there isn't anything I can do about the visit, unless there is a sooner opening.

## 2024-04-05 ENCOUNTER — Telehealth: Payer: Self-pay

## 2024-04-05 NOTE — Telephone Encounter (Signed)
 Called and LVM for patient to schedule a office visit for hospital bed

## 2024-04-05 NOTE — Telephone Encounter (Signed)
 Called and LVM for patient to give our office a call back in regards to this

## 2024-04-05 NOTE — Telephone Encounter (Signed)
 Please advise as I called patient back and explained to her that this may not be promised to get done by tomorrow

## 2024-04-05 NOTE — Telephone Encounter (Signed)
 Copied from CRM [id number]. Topic: Clinical - Medication Question >> Apr 04, 2024  1:46 PM Emmet Harm C wrote: Reason for CRM: Patient needs a order for B pack and call patient with information [phone] and have sent to [facility]

## 2024-04-05 NOTE — Telephone Encounter (Signed)
 Spoke with Esperanza Hedges with [facility] who states patient was given a BiPAP not a NIV per hospice order with settings 16/5. Now that patient is not going through hospice, [facility] is not in network with [employer] and will have to transfer DME's ([facility]). Spoke with Blaise Bumps at [facility] who states hospital summary and blood gas done in the hospital will not qualify the patient. Jerlene Moody Sueanne Emerald has a held spot tomorrow 6/18, will offer to patient. Attempted to call, unable to leave voicemail for daughter Lavonna Prader, when she returns call please transfer to [facility] at the clinic.

## 2024-04-05 NOTE — Telephone Encounter (Signed)
 Called and spoke with patient, states she doesn't think she can make the appointment tomorrow with Jerlene Moody due to her husbands appointment. Patient states she is working with Dr. Constance Dellen office to push the order through. Explained we were told by [facility] she will need an office visit, ABG, possible sleep study in order to get the equipment covered. Patient scheduled the appointment tomorrow with [facility] and will call back if she cannot make it.

## 2024-04-05 NOTE — Telephone Encounter (Signed)
 Attempted to contact Luann again, no answer, unable to leave voicemail

## 2024-04-05 NOTE — Telephone Encounter (Signed)
 Called and LVM for patient to schedule and office visit

## 2024-04-06 ENCOUNTER — Telehealth: Payer: Self-pay | Admitting: Primary Care

## 2024-04-06 ENCOUNTER — Encounter: Payer: Self-pay | Admitting: Primary Care

## 2024-04-06 ENCOUNTER — Other Ambulatory Visit: Payer: Self-pay

## 2024-04-06 ENCOUNTER — Ambulatory Visit: Admitting: Primary Care

## 2024-04-06 VITALS — BP 120/58 | HR 85 | Temp 97.5°F | Ht 63.0 in | Wt 100.6 lb

## 2024-04-06 DIAGNOSIS — J9611 Chronic respiratory failure with hypoxia: Secondary | ICD-10-CM

## 2024-04-06 DIAGNOSIS — J849 Interstitial pulmonary disease, unspecified: Secondary | ICD-10-CM

## 2024-04-06 DIAGNOSIS — R0609 Other forms of dyspnea: Secondary | ICD-10-CM

## 2024-04-06 DIAGNOSIS — I272 Pulmonary hypertension, unspecified: Secondary | ICD-10-CM | POA: Diagnosis not present

## 2024-04-06 DIAGNOSIS — J9612 Chronic respiratory failure with hypercapnia: Secondary | ICD-10-CM | POA: Diagnosis not present

## 2024-04-06 DIAGNOSIS — I5032 Chronic diastolic (congestive) heart failure: Secondary | ICD-10-CM

## 2024-04-06 DIAGNOSIS — R059 Cough, unspecified: Secondary | ICD-10-CM

## 2024-04-06 MED ORDER — YUPELRI 175 MCG/3ML IN SOLN
175.0000 ug | Freq: Every day | RESPIRATORY_TRACT | Status: DC
Start: 2024-04-06 — End: 2024-05-03

## 2024-04-06 NOTE — Patient Instructions (Addendum)
 -  CHRONIC RESPIRATORY FAILURE WITH HYPOXIA AND HYPERCAPNIA: This condition means that your lungs are not able to get enough oxygen  into your blood or remove enough carbon dioxide from your body. We will order a noninvasive ventilator through [facility] and perform an arterial blood gas test as required by Medicare. Please ensure that hospice does not retrieve your current ventilator until the replacement is provided. We will fax the order to [facility] and follow up with a phone call.  -PULMONARY HYPERTENSION: This is high blood pressure in the arteries of your lungs, which can contribute to your breathing difficulties. We will continue to monitor and manage this condition.  -INTERSTITIAL LUNG DISEASE: This is a group of lung disorders that cause scarring of lung tissues, leading to breathing problems. We will continue to monitor and manage this condition.  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 It is related to your scleroderma. We will continue to monitor and manage this condition.  -DIASTOLIC HEART FAILURE: This condition means that your heart does not relax properly between beats, which can cause fluid buildup and breathing difficulties. You reported no distress in breathing at rest but require rest after exertion. We will continue to monitor and manage this condition.  INSTRUCTIONS: We will perform blood work to check your CO2 levels. We also discussed your medication management and will provide samples of Yupelri  for your maintenance nebulizer therapy.  Orders: Non-invasive ventilator order sent to [facility] ABGs to be done at hospital   Follow-up 8 weeks with Dr. Marygrace Snellen

## 2024-04-06 NOTE — Patient Outreach (Signed)
 Complex Care Management   Visit Note  04/06/2024  Name:  Paula Ward MRN: [id number] DOB: [birth year]  Situation: Referral received for Complex Care Management related to ILD, Pulmonary HTN, HF I obtained verbal consent from Patient.  Visit completed with patient  on the phone  Background:   Past Medical History:  Diagnosis Date   Anemia    Angiodysplasia of stomach and duodenum    Aortic stenosis    Arthus phenomenon    AVM (arteriovenous malformation)    Blood transfusion without reported diagnosis    Breast cancer (HCC) 1989   Left   Candida esophagitis (HCC)    Cataract    Chronic respiratory failure (HCC)    CKD stage 3b, GFR 30-44 ml/min (HCC)    Corneal edema    Corneal epithelial basement membrane dystrophy    CREST syndrome (HCC)    GERD (gastroesophageal reflux disease)    w/ HH   Interstitial lung disease (HCC)    Nodule of kidney    Pericardial effusion    PONV (postoperative nausea and vomiting)    Pulmonary embolus (HCC) 2003   Pulmonary hypertension (HCC)    Rectal incontinence    Renal cell carcinoma (HCC)    Scleroderma (HCC)    Tubular adenoma of colon    Uterine polyp    Assessment: Paula Ward reports she is feeling better and states she is more clear headed since hospitalization. She states she is on the way to pulmonologist office for assistance with obtaining BIPAP. Unable to complete telephone assessment.  Recommendation:   Patient to continue follow up with providers as recommended/scheduled  Follow Up Plan:   Telephone follow up appointment date/time:  04/12/24  Lindi Revering, RN, MSN, BSN, CCM [place]  [facility], Population Health Case Manager Phone: [phone]

## 2024-04-06 NOTE — Patient Instructions (Signed)
 Visit Information  Thank you for taking time to visit with me today. Please don't hesitate to contact me if I can be of assistance to you before our next scheduled appointment.  Your next care management appointment is by telephone on 04/12/24 at 2:30 pm  Please call the care guide team at [phone] if you need to cancel, schedule, or reschedule an appointment.   Please call the Suicide and Crisis Lifeline: 988 call the [place]  National Suicide Prevention Lifeline: 816 150 5634 or TTY: 717-447-5921 TTY 937-356-5419) to talk to a trained counselor if you are experiencing a Mental Health or Behavioral Health Crisis or need someone to talk to.  Lindi Revering, RN, MSN, BSN, CCM [place]  [facility], Population Health Case Manager Phone: [phone]

## 2024-04-06 NOTE — Telephone Encounter (Signed)
 Can we initiate a prior auth for yupelri 

## 2024-04-06 NOTE — Telephone Encounter (Signed)
 Patient is needing office visit. They are being seen today to get a new Bipap. This is currently being handled in 04/04/24 Telephone encounter.

## 2024-04-06 NOTE — Progress Notes (Signed)
 @Patient  ID: Belenda Bowie, female    DOB: 03-08-1936, 88 y.o.   MRN: [id number]  No chief complaint on file.   Referring provider: Adelia Homestead, *  HPI: 88 y.o. 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   Most recent cardiology note reviewed.    Returns for routine follow-up. She remains quite dyspneic.  We discussed that there are multiple etiologies for this noted to have a good fix for her.  Suspect of pulmonary hypertension, heart failure, and ILD will progress over time leading to worsening symptoms.  Insurance denied Yupelri .  Arformoterol  still helps some.  She reported presyncope.  Or black spots in her eyes.  Saw the eye doctor with no findings.  Sounds like presyncope.  Likely valvular stenosis and poor cardiac output leading to decreased blood flow to the brain.  We discussed in detail.  She states her oxygen  is  always 90% or greater, usually at 100%.  Only occurs when carrying something and ambulating like groceries.  Recommended she avoid activities that cause the symptoms.  HPI at initial visit: Patient here for evaluation of pulmonary hypertension.  Multiple hospitalizations for decompensated CHF left-sided disease, pulmonary venous hypertension.  As evidenced by serial images with pleural effusions and pulmonary edema.  Her echocardiogram over time is showing a severely dilated left atrium with valvular dysfunction as well as diastolic dysfunction.  Fortunately her RV functional serial echocardiograms look fine.  Her most recent right heart catheterization LVEDP of 18, PVR of 2 [facility] units.  Her prior right heart catheterization at [facility] years prior with a PVR less than 2.  Both times on Opsumit .  Her initial cath 2001 with PVR 5.  We discussed at length the etiology of her pulmonary hypertension.  Has evolved over time.   The changing physiology of her pulmonary hypertension as a relates to her left-sided disease and valvular disease and likely worsening on pulmonary vasodilators.  She expressed understanding.  She is feeling well since discharge.  Feels better off the Opsumit .  Discussed how VQ mismatch could worsen on Opsumit  given her interstitial lung disease.  Overall her hypoxemia seems improved.  She walked in the clinic today and immediate after sitting down with send saturation was 94%.  We discussed ongoing oxygen  use with exertion that she is to monitor at home as discussed below.    Pulmonary hypertension: Documented on the right heart cath first diagnosed 2001.  Serial markers reviewed.  Multifactorial nature of disease primarily group 2 and 3 disease given elevated wedge pressure and mild ILD, hypoxemia.  At time of initial cath in 2001 PVR was elevated over 5 for group 1 disease likely was primary tumor back then, driven by scleroderma and CTD.  However, has a physiology of her heart has changed over time with diastolic dysfunction, aortic stenosis, mitral valve stenosis, diastolic dysfunction and development of left atrial hypertension, pulmonary venous hypertension, suspect systemic pulmonary vasodilators has led to exacerbation of left-sided congestive heart failure.  Multiple admissions for CHF with signs of pulmonary edema and pleural effusions again indicating pulmonary venous hypertension.  Her most recent catheterizations have indicated a PVR less than 3, admittedly on ERA, Opsumit .  However given left-sided disease and left-sided lower pulmonary venous hypertension, we stopped her Opsumit .  She is feeling better.  Possibly contributing to worsening VQ mismatch as well in the setting of her  ILD.  Frankly, given her frailty and complex and multifactorial disease, additional pulmonary vasodilators are likely not in her best interest.  She remains stable off Opsumit , weight stable, decreasing diuretic doses  via her cardiology office.  Further titration of diuretics via cardiology.  Stressed importance of low-salt diet and diuretic therapy.  She expressed understanding.  Fortunately RV function on most recent echocardiogram was excellent.   Chronic hypoxemic respiratory failure: Likely multifactorial but primarily driven by CT ILD.  Encouraged to check oxygen  to keep in the mid 90s if able especially with exertion to avoid hypoxic vasoconstriction which can worsen acute pulmonary hypertension symptoms are chronically contribute to worsening pulmonary hypertension and RV failure.  On 3 L nasal cannula pulsed via POC today.   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  Largely unchanged over serial images over the last several years.  No progression.  In the past has declined antifibrotic's given side effect profile.  Given her age and frailty this is reasonable.   Presyncope: Suspect related to poor cardiac output as well as bowel dysfunction pulmonary hypertension.  Only occurs with exertion.  Oxygen  saturation reportedly 90+ percent.  Does not sound like the hypoxemia issue.  More forward flow issue.  Will message cardiology colleagues and let them know.  They plan to 1 year follow-up of echocardiogram to see if it is reasonable to repeat this shorter interval.       04/06/2024- Interim hx Discussed the use of AI scribe software for clinical note transcription with the patient, who gave verbal consent to proceed.  History of Present Illness   SAHIRAH RUDELL is an 88 year old female with pulmonary hypertension and interstitial lung disease who presents for hospital follow-up and noninvasive ventilator setup.  She was admitted to the hospital from June 4th to June 7th for acute and chronic respiratory failure with hypoxia and hypercapnia. During her hospital stay, she was started on BiPAP, which improved her confusion and breathing. She is currently on a baseline of four to five liters of supplemental  oxygen , which she increases with exertion.  She originally presented to the hospital with elevated CO2 levels and associated confusion. Venous blood gases obtained on admission showed a PCO2 of 86. Her breathing is stable as long as she does not exert herself too much. No distress in breathing while at rest but requires occasional rest. No swelling in her legs.  Her medication regimen includes a diuretic, which she doubled on Monday, and a nebulizer treatment with Brovana  (arformoterol ) twice a day. She is not taking Yupelri  due to insurance coverage issues.  She has a history of renal cell carcinoma and breast cancer. Her insurance situation has been complicated by her previous hospice enrollment, which she has since discontinued to maintain her current insurance coverage.      Allergies  Allergen Reactions   Codeine  Nausea Only    Hallucinations, too   Other Nausea And Vomiting and Other (See Comments)    -mycin antibiotics.   Also cause hallucinations.   Erythromycin Nausea And Vomiting   Iodinated Contrast Media Other (See Comments)    Renal issues   Lisinopril Other (See Comments)    Pt doesn't remember reaction   Mirtazapine  Other (See Comments)    Threw me into orbit   Mycophenolate Mofetil Nausea Only    Made me nervous   Sulfa  Antibiotics Other (See Comments)    Unknown reaction    Immunization History  Administered Date(s) Administered   Fluad Quad(high  Dose 65+) 07/24/2020   Influenza, High Dose Seasonal PF 07/07/2017, 06/21/2019   Influenza-Unspecified 07/15/2013, 02/16/2015, 07/19/2015, 07/18/2016, 08/20/2018, 06/24/2021, 08/03/2022   Moderna Sars-Covid-2 Vaccination 11/01/2019, 11/29/2019, 09/14/2020, 02/16/2021   Pneumococcal Conjugate-13 01/30/2014   Pneumococcal Polysaccharide-23 08/30/2007   Respiratory Syncytial Virus Vaccine ,Recomb Aduvanted(Arexvy ) 09/24/2022   Tdap 06/26/2011, 06/07/2018   Zoster, Live 08/15/2014    Past Medical History:  Diagnosis  Date   Anemia    Angiodysplasia of stomach and duodenum    Aortic stenosis    Arthus phenomenon    AVM (arteriovenous malformation)    Blood transfusion without reported diagnosis    Breast cancer (HCC) 1989   Left   Candida esophagitis (HCC)    Cataract    Chronic respiratory failure (HCC)    CKD stage 3b, GFR 30-44 ml/min (HCC)    Corneal edema    Corneal epithelial basement membrane dystrophy    CREST syndrome (HCC)    GERD (gastroesophageal reflux disease)    w/ HH   Interstitial lung disease (HCC)    Nodule of kidney    Pericardial effusion    PONV (postoperative nausea and vomiting)    Pulmonary embolus (HCC) 2003   Pulmonary hypertension (HCC)    Rectal incontinence    Renal cell carcinoma (HCC)    Scleroderma (HCC)    Tubular adenoma of colon    Uterine polyp     Tobacco History: Social History   Tobacco Use  Smoking Status Never  Smokeless Tobacco Never   Counseling given: Not Answered   Outpatient Medications Prior to Visit  Medication Sig Dispense Refill   acetaminophen  (TYLENOL ) 325 MG tablet Take 2 tablets (650 mg total) by mouth every 6 (six) hours as needed for moderate pain. 30 tablet 0   albuterol  (PROVENTIL ) (2.5 MG/3ML) 0.083% nebulizer solution Take 3 mLs (2.5 mg total) by nebulization every 6 (six) hours as needed for wheezing or shortness of breath. 75 mL 12   albuterol  (VENTOLIN  HFA) 108 (90 Base) MCG/ACT inhaler INHALE 2 PUFFS INTO THE LUNGS EVERY 6 HOURS AS NEEDED FOR WHEEZING OR SHORTNESS OF BREATH 6.7 g 3   arformoterol  (BROVANA ) 15 MCG/2ML NEBU Take 2 mLs (15 mcg total) by nebulization 2 (two) times daily. 120 mL 6   augmented betamethasone dipropionate (DIPROLENE-AF) 0.05 % cream Apply 1 application  topically 2 (two) times daily as needed (for irritation).     clobetasol ointment (TEMOVATE) 0.05 % Apply 1 Application topically 2 (two) times daily as needed (for irritation).     diltiazem  (CARDIZEM ) 30 MG tablet Take 1 tablet (30 mg total)  by mouth every 12 (twelve) hours. 60 tablet 11   docusate sodium  (COLACE) 100 MG capsule Take 100 mg by mouth daily.     empagliflozin  (JARDIANCE ) 10 MG TABS tablet Take 1 tablet (10 mg total) by mouth daily. (Patient not taking: Reported on 03/24/2024) 30 tablet 3   famotidine  (PEPCID ) 20 MG tablet TAKE 1 TABLET BY MOUTH AT BEDTIME 30 tablet 0   fluticasone  (FLONASE ) 50 MCG/ACT nasal spray USE 1 SPRAY(S) IN EACH NOSTRIL ONCE DAILY AS NEEDED FOR ALLERGIES 16 g 0   furosemide  (LASIX ) 20 MG tablet Take 2 tablets ( 40 mg ) every morning may take extra 2 tablets for weight gain of 3 lbs 360 tablet 3   hydrOXYzine  (ATARAX ) 25 MG tablet Take 1 tablet (25 mg total) by mouth 3 (three) times daily as needed for anxiety. 30 tablet 0   latanoprost  (XALATAN ) 0.005 % ophthalmic solution  Place 1 drop into both eyes at bedtime.     magic mouthwash SOLN Take 10 mLs by mouth 4 (four) times daily as needed for mouth pain. 15 mL 2   Multiple Vitamin (MULTIVITAMIN WITH MINERALS) TABS tablet Take 1 tablet by mouth daily.     Nutritional Supplements (ENSURE HIGH PROTEIN) LIQD Take 0.5 Bottles by mouth daily as needed (for supplementation).     oxyCODONE  (ROXICODONE ) 5 MG/5ML solution Take 0.25 mLs (0.25 mg total) by mouth every 3 (three) hours as needed for moderate pain (pain score 4-6) or severe pain (pain score 7-10) (shortness of breath). 15 mL 0   OXYGEN  Inhale 6 L/min into the lungs continuous.     pantoprazole  (PROTONIX ) 40 MG tablet TAKE 1 TABLET BY MOUTH TWICE DAILY BEFORE A MEAL 180 tablet 0   revefenacin  (YUPELRI ) 175 MCG/3ML nebulizer solution Take 3 mLs (175 mcg total) by nebulization daily. (Patient not taking: Reported on 03/16/2024) 90 mL 6   spironolactone  (ALDACTONE ) 25 MG tablet Take 1/2 tablet ( 12.5 mg ) every morning     sucralfate  (CARAFATE ) 1 GM/10ML suspension TAKE  10 ML BY MOUTH EVERY 6 HOURS AS NEEDED FOR REFLUX 420 mL 0   triamcinolone  cream (KENALOG ) 0.1 % Apply 1 Application topically See  admin instructions. Apply to affected leg(s) after showers and up to 2 additional times a day as needed for irritation     No facility-administered medications prior to visit.    Review of Systems  Review of Systems  Constitutional:  Positive for fatigue.  HENT: Negative.    Respiratory:  Positive for shortness of breath.   Cardiovascular: Negative.    Physical Exam  There were no vitals taken for this visit. Physical Exam Constitutional:      General: She is not in acute distress.    Appearance: Normal appearance. She is ill-appearing.     Comments: Chronically ill appearing  HENT:     Head: Normocephalic.     Mouth/Throat:     Mouth: Mucous membranes are moist.     Pharynx: Oropharynx is clear.   Cardiovascular:     Rate and Rhythm: Normal rate and regular rhythm.     Heart sounds: Murmur heard.  Pulmonary:     Effort: Pulmonary effort is normal.     Breath sounds: Rales present.     Comments: 5L supplemental oxygen    Musculoskeletal:        General: Normal range of motion.   Skin:    General: Skin is warm and dry.   Neurological:     General: No focal deficit present.     Mental Status: She is alert and oriented to person, place, and time. Mental status is at baseline.   Psychiatric:        Mood and Affect: Mood normal.        Behavior: Behavior normal.        Thought Content: Thought content normal.        Judgment: Judgment normal.      Lab Results:  CBC    Component Value Date/Time   WBC 6.4 03/24/2024 0311   RBC 2.48 (L) 03/24/2024 0311   HGB 8.9 (L) 03/24/2024 0311   HGB 8.8 (L) 03/23/2024 1439   HGB 11.2 04/21/2022 0940   HCT 29.2 (L) 03/24/2024 0311   HCT 32.7 (L) 04/21/2022 0940   PLT 121 (L) 03/24/2024 0311   PLT 118 (L) 03/23/2024 1439   PLT 242 04/21/2022 0940  MCV 117.7 (H) 03/24/2024 0311   MCV 104 (H) 04/21/2022 0940   MCH 35.9 (H) 03/24/2024 0311   MCHC 30.5 03/24/2024 0311   RDW 12.0 03/24/2024 0311   RDW 13.1 04/21/2022  0940   LYMPHSABS 0.7 03/23/2024 1730   MONOABS 0.3 03/23/2024 1730   EOSABS 0.3 03/23/2024 1730   BASOSABS 0.0 03/23/2024 1730    BMET    Component Value Date/Time   NA 141 03/24/2024 0311   NA 145 (H) 10/30/2023 1434   K 4.0 03/24/2024 0311   CL 89 (L) 03/24/2024 0311   CO2 40 (H) 03/24/2024 0311   GLUCOSE 92 03/24/2024 0311   GLUCOSE 101 (H) 10/02/2006 0845   BUN 68 (H) 03/24/2024 0311   BUN 48 (H) 10/30/2023 1434   CREATININE 1.28 (H) 03/24/2024 0311   CREATININE 1.56 (H) 03/23/2024 1439   CALCIUM  9.5 03/24/2024 0311   GFRNONAA 41 (L) 03/24/2024 0311   GFRNONAA 32 (L) 03/23/2024 1439   GFRAA >60 05/24/2018 0652    BNP    Component Value Date/Time   BNP 2,711.9 (H) 03/23/2024 2023    ProBNP    Component Value Date/Time   PROBNP 2,680.0 (H) 06/03/2023 1200    Imaging: CT HEAD WO CONTRAST ( ) Result Date: 03/23/2024 CLINICAL DATA:  Shortness of breath with weakness and fatigue. EXAM: CT HEAD WITHOUT CONTRAST TECHNIQUE: Contiguous axial images were obtained from the base of the skull through the vertex without intravenous contrast. RADIATION DOSE REDUCTION: This exam was performed according to the departmental dose-optimization program which includes automated exposure control, adjustment of the mA and/or kV according to patient size and/or use of iterative reconstruction technique. COMPARISON:  October 10, 2018 FINDINGS: Brain: No evidence of acute infarction, hemorrhage, hydrocephalus, extra-axial collection or mass lesion/mass effect. There are areas of decreased attenuation within the white matter tracts of the supratentorial brain, consistent with microvascular disease changes. Vascular: Moderate to marked severity bilateral cavernous carotid artery calcification is noted. Skull: Normal. Negative for fracture or focal lesion. Sinuses/Orbits: No acute finding. Other: None. IMPRESSION: 1. No acute intracranial abnormality. 2. Generalized cerebral atrophy with chronic  white matter small vessel ischemic changes. Electronically Signed   By: Virgle Grime M.D.   On: 03/23/2024 21:46   DG Chest Port 1 View Result Date: 03/23/2024 CLINICAL DATA:  Shortness of breath. EXAM: PORTABLE CHEST 1 VIEW COMPARISON:  12/04/2023 and CT chest 04/23/2022. FINDINGS: Trachea is midline. Globular and enlarged cardiopericardial silhouette, as on 12/04/2023. New mixed interstitial and airspace opacification. Left pleural effusion. IMPRESSION: 1. Congestive heart failure. 2. Cardiac enlargement with a large pericardial effusion, unchanged from 12/04/2023. Electronically Signed   By: Shearon Denis M.D.   On: 03/23/2024 17:58   US  Abdomen Limited RUQ (LIVER/GB) Result Date: 03/09/2024 CLINICAL DATA:  evaluate for liver disease EXAM: ULTRASOUND ABDOMEN LIMITED COMPARISON:  None Available. FINDINGS: The liver demonstrates diffusely coarsened echotexture without focal hepatic parenchymal lesions or intrahepatic ductal dilatation. The gallbladder demonstrates no stones, wall thickening or pericholecystic fluid. CBD measured 0.4cm. Pericardial effusion noted as an incidental finding. IMPRESSION: 1. Coarsened hepatic echotexture. 2. Pericardial effusion. Electronically Signed   By: Sydell Eva M.D.   On: 03/09/2024 11:41     Assessment & Plan:    1. Chronic respiratory failure with hypoxia and hypercapnia (HCC) (Primary) - Pulmonary function test; Future  2. ILD (interstitial lung disease) (HCC)  3. Pulmonary hypertension (HCC)  4. Chronic diastolic heart failure (HCC)  Assessment and Plan    Chronic respiratory failure  with hypoxia and hypercapnia Admitted on 03/22/24 for acute on chronic respiratory failure, requiring baseline supplemental oxygen  of 5-6 liters. Patient was noted to have hypercarbia on VBGs. She was discharged on NIV provided through hospice. Patient has being using non-invasive ventilator nightly. Per [facility] she needs repeat ABGs due to medicare requirement.  ABGs will be drawn tomorrow, please note that current CO2 levels may not be an accurate reflection of underlying respiratory status as patient is currently on therapy and compliant NIV use.  The patient continues to exhibit signs of hypercapnea associated with chronic respiratory failure secondary to ILD, CHF, pulmonary HTN and restrictive lung disease.  Interruption or failure to provide NIV would quickly lead to exacerbation of the patient's condition, hospital readmission, and likely harm the patient.  Continued use is preferred.  The use of the NIV will treat the patient's PCO2 levels and can reduce the risk of exacerbations and future hospitalizations when used at night and during the day.  Bilevel/RAD therapy with and without a rate would be ineffective as the patient requires a volume targeted mode.  Ventilation is required to decrease the work of breathing and improve pulmonary status.  Interruption of ventilator support would lead to a decline of health status.  Patient is able to protect their airways and clear secretions on their own. - Order noninvasive ventilator through [facility], billed through insurance. - Perform ABG due to Medicare requirements. - Ensure hospice does not retrieve current noninvasive ventilator until replacement is provided. - Fax order for noninvasive ventilator to [facility] and follow up with a phone call.  Pulmonary hypertension Chronic condition contributing to respiratory failure. Continue diuretic and supplemental oxygen    Interstitial lung disease Chronic condition contributing to respiratory failure.  Follow-up Blood work to check CO2 levels. Discussion of medication management and potential sample provision for Yupelri . - Perform blood work to check CO2 levels. - Provide samples of Yupelri  for maintenance nebulizer therapy.  Recording duration: 15 minutes      Chronic respiratory failure with hypoxia and hypercapnia Patient was noted to have hypercarbia  on VBGs. She was discharged on NIV provided through hospice. Patient has being using ventilator nightly. Per [facility] she needs repeat ABGs due to medicare requirement. Please note patient ABGs done today, per medicare requirement, will be done while on NIV therapy. Current CO2 levels may not be an accurate reflection of underlying respiratory status.   Antonio Baumgarten, NP 04/06/2024

## 2024-04-07 ENCOUNTER — Inpatient Hospital Stay (HOSPITAL_COMMUNITY): Admission: RE | Admit: 2024-04-07 | Source: Ambulatory Visit

## 2024-04-07 ENCOUNTER — Telehealth: Payer: Self-pay

## 2024-04-07 ENCOUNTER — Encounter (HOSPITAL_COMMUNITY)

## 2024-04-07 NOTE — Telephone Encounter (Signed)
 Copied from CRM [id number]. Topic: Clinical - Order For Equipment >> Apr 07, 2024  9:09 AM Paula Ward wrote: Reason for CRM: Paula Ward from [facility] [phone] option #1 wants to speak with Paula Ward regarding diagnosis of the ventilator order. Please call back.  I called [facility] and spoke to Paula Ward. Paula Ward wanted to f/u on this pt with a COPD diagnosis and states she found 2 lov notes stating this. Paula Ward is just waiting on the ABG's to be completed and everything will be completed. NFN

## 2024-04-08 ENCOUNTER — Inpatient Hospital Stay (HOSPITAL_COMMUNITY): Admission: RE | Admit: 2024-04-08 | Source: Ambulatory Visit

## 2024-04-08 ENCOUNTER — Encounter (HOSPITAL_COMMUNITY)

## 2024-04-08 ENCOUNTER — Telehealth: Payer: Self-pay

## 2024-04-08 ENCOUNTER — Other Ambulatory Visit (HOSPITAL_COMMUNITY): Payer: Self-pay

## 2024-04-08 DIAGNOSIS — I5033 Acute on chronic diastolic (congestive) heart failure: Secondary | ICD-10-CM | POA: Diagnosis not present

## 2024-04-08 NOTE — Progress Notes (Signed)
 Complex Care Management Care Guide Note  04/08/2024 Name: Paula Ward MRN: [id number] DOB: February 08, 1936  Paula Ward is a 88 y.o. year old female who is a primary care patient of Adelia Homestead, MD and is actively engaged with the care management team. I reached out to Belenda Bowie by phone today to assist with re-scheduling  with the RN Case Manager.  Follow up plan: Telephone appointment with complex care management team member scheduled for:  04/15/24 at 1:30 p.m.  [facility]  [facility], [facility] Care Management Assistant Direct Dial: [phone]  Fax: [phone]

## 2024-04-08 NOTE — Telephone Encounter (Signed)
 Nebulizer solutions typically need to be processed by Medicare Part B as they are considered a DME supply.

## 2024-04-09 LAB — BASIC METABOLIC PANEL WITH GFR
BUN/Creatinine Ratio: 31 — ABNORMAL HIGH (ref 12–28)
BUN: 52 mg/dL — ABNORMAL HIGH (ref 8–27)
CO2: 26 mmol/L (ref 20–29)
Calcium: 9.6 mg/dL (ref 8.7–10.3)
Chloride: 93 mmol/L — ABNORMAL LOW (ref 96–106)
Creatinine, Ser: 1.69 mg/dL — ABNORMAL HIGH (ref 0.57–1.00)
Glucose: 79 mg/dL (ref 70–99)
Potassium: 4.7 mmol/L (ref 3.5–5.2)
Sodium: 138 mmol/L (ref 134–144)
eGFR: 29 mL/min/{1.73_m2} — ABNORMAL LOW (ref >59)

## 2024-04-10 ENCOUNTER — Ambulatory Visit: Payer: Self-pay | Admitting: Cardiology

## 2024-04-11 ENCOUNTER — Other Ambulatory Visit: Payer: Self-pay

## 2024-04-11 DIAGNOSIS — I10 Essential (primary) hypertension: Secondary | ICD-10-CM

## 2024-04-11 DIAGNOSIS — I5033 Acute on chronic diastolic (congestive) heart failure: Secondary | ICD-10-CM

## 2024-04-11 NOTE — Telephone Encounter (Signed)
 Rx for Yupelri  sent to [facility] to process under Medicare Part B. I called and spoke to pt. I informed pt of the Rx to [facility]. Pt verbalized understanding. NFN

## 2024-04-11 NOTE — Telephone Encounter (Signed)
 Please send Yupelri  prescription to DME - process under medicare part B

## 2024-04-12 ENCOUNTER — Ambulatory Visit (HOSPITAL_COMMUNITY)
Admission: RE | Admit: 2024-04-12 | Discharge: 2024-04-12 | Disposition: A | Discharge status: Home / Self Care | Source: Ambulatory Visit | Attending: Primary Care | Admitting: Primary Care

## 2024-04-12 ENCOUNTER — Telehealth: Payer: Self-pay

## 2024-04-12 ENCOUNTER — Other Ambulatory Visit (HOSPITAL_COMMUNITY)
Admission: RE | Admit: 2024-04-12 | Discharge: 2024-04-12 | Disposition: A | Discharge status: Home / Self Care | Source: Ambulatory Visit | Attending: *Deleted | Admitting: *Deleted

## 2024-04-12 ENCOUNTER — Ambulatory Visit: Payer: Self-pay | Admitting: Primary Care

## 2024-04-12 DIAGNOSIS — J9612 Chronic respiratory failure with hypercapnia: Secondary | ICD-10-CM | POA: Insufficient documentation

## 2024-04-12 DIAGNOSIS — J9611 Chronic respiratory failure with hypoxia: Secondary | ICD-10-CM | POA: Insufficient documentation

## 2024-04-12 LAB — PULMONARY FUNCTION TEST
DL/VA % pred: 58 %
DL/VA: 2.4 ml/min/mmHg/L
DLCO unc % pred: 35 %
DLCO unc: 6.29 ml/min/mmHg
FEF 25-75 Post: 1.32 L/s
FEF 25-75 Pre: 1.21 L/s
FEF2575-%Change-Post: 8 %
FEF2575-%Pred-Post: 120 %
FEF2575-%Pred-Pre: 111 %
FEV1-%Change-Post: 0 %
FEV1-%Pred-Post: 55 %
FEV1-%Pred-Pre: 55 %
FEV1-Post: 0.92 L
FEV1-Pre: 0.92 L
FEV1FVC-%Change-Post: 3 %
FEV1FVC-%Pred-Pre: 121 %
FEV6-%Change-Post: -3 %
FEV6-%Pred-Post: 47 %
FEV6-%Pred-Pre: 49 %
FEV6-Post: 1 L
FEV6-Pre: 1.04 L
FEV6FVC-%Pred-Post: 106 %
FEV6FVC-%Pred-Pre: 106 %
FVC-%Change-Post: -3 %
FVC-%Pred-Post: 44 %
FVC-%Pred-Pre: 46 %
FVC-Post: 1 L
FVC-Pre: 1.04 L
Post FEV1/FVC ratio: 91 %
Post FEV6/FVC ratio: 100 %
Pre FEV1/FVC ratio: 89 %
Pre FEV6/FVC Ratio: 100 %
RV % pred: 58 %
RV: 1.43 L
TLC % pred: 49 %
TLC: 2.43 L

## 2024-04-12 LAB — BLOOD GAS, ARTERIAL
Acid-Base Excess: 6 mmol/L — ABNORMAL HIGH (ref 0.0–2.0)
Bicarbonate: 33 mmol/L — ABNORMAL HIGH (ref 20.0–28.0)
O2 Saturation: 99.3 %
Patient temperature: 37
pCO2 arterial: 57 mmHg — ABNORMAL HIGH (ref 32–48)
pH, Arterial: 7.37 (ref 7.35–7.45)
pO2, Arterial: 127 mmHg — ABNORMAL HIGH (ref 83–108)

## 2024-04-12 MED ORDER — ALBUTEROL SULFATE (2.5 MG/3ML) 0.083% IN NEBU
2.5000 mg | INHALATION_SOLUTION | Freq: Once | RESPIRATORY_TRACT | Status: AC
  Administered 2024-04-12: 2.5 mg via RESPIRATORY_TRACT

## 2024-04-12 NOTE — Telephone Encounter (Signed)
 I dont have the results back yet, can we call respiratory and ask about ABG results

## 2024-04-12 NOTE — Telephone Encounter (Signed)
 Copied from CRM [id number]. Topic: Clinical - Lab/Test Results >> Apr 08, 2024  2:22 PM Nathanel DEL wrote: Reason for CRM: pt wants Almarie Ferrari to know she has done the blood work that was ordered.  Please get the results to [facility] asap. This is for a Bipap.

## 2024-04-12 NOTE — Progress Notes (Signed)
 I have reached out to Raven to see if she can check on this

## 2024-04-12 NOTE — Progress Notes (Signed)
 ABGs showed hypercarbia, we have already filled out an order form for NIV, I believe [facility] requested ABGs for insurance purposes. Please call DME and ensure Triology vent delivered

## 2024-04-13 ENCOUNTER — Ambulatory Visit: Payer: Self-pay | Admitting: Primary Care

## 2024-04-13 ENCOUNTER — Other Ambulatory Visit: Payer: Self-pay | Admitting: Physician Assistant

## 2024-04-13 ENCOUNTER — Other Ambulatory Visit: Payer: Self-pay | Admitting: Hematology and Oncology

## 2024-04-13 ENCOUNTER — Telehealth: Payer: Self-pay | Admitting: Primary Care

## 2024-04-13 DIAGNOSIS — J9611 Chronic respiratory failure with hypoxia: Secondary | ICD-10-CM

## 2024-04-13 DIAGNOSIS — R059 Cough, unspecified: Secondary | ICD-10-CM

## 2024-04-13 NOTE — Telephone Encounter (Signed)
 Per Terri at [facility]-  All nebulizer medication orders must be sent either through fax or [facility] or surescripts

## 2024-04-13 NOTE — Telephone Encounter (Signed)
 Yes I believe I sent a message to triage or [name]. ABGs showed hypercarbia. We need to call [facility] I believe and follow-up up on Triology order, it was already been filled out and faxed to them. They required ABGs for NIV.

## 2024-04-15 ENCOUNTER — Other Ambulatory Visit: Payer: Self-pay

## 2024-04-15 NOTE — Telephone Encounter (Signed)
 [facility] states they have still not received the order the nebulizer med. Were you able to send it over?

## 2024-04-15 NOTE — Patient Instructions (Addendum)
 Visit Information  Thank you for taking time to visit with me today. Please don't hesitate to contact me if I can be of assistance to you before our next scheduled appointment.  Our next appointment is by telephone on 05/16/24 at 1:30 pm Please call the care guide team at [phone] if you need to cancel or reschedule your appointment.   Following is a copy of your care plan:   Goals Addressed             This Visit's Progress    VBCI RN Care Plan       Problems:  Interstitial Lung Disease, pulmonary HTN, HF  Goal: Over the next 90 days the Patient will attend all scheduled medical appointments: pulmonology, oncology, clinical pharmacy as evidenced by patient report or review of chart        continue to work with RN Care Manager and/or Social Worker to address care management and care coordination needs related to ILD as evidenced by adherence to care management team scheduled appointments     demonstrate Ongoing adherence to prescribed treatment plan for ILD as evidenced by patient report or review of chart take all medications exactly as prescribed and will call provider for medication related questions as evidenced by patient report or review of chart     Interventions:  Evaluation of current treatment plan related to ILD, pulmonary HTN, HF self-management and patient's adherence to plan as established by provider. Patient reports feeling better since hospitalization. She is on the way to pulmonology to assist with obtaining NIV. Discussed plans with patient for ongoing care management follow up and provided patient with direct contact information for care management team Reviewed patient instructions with patient from pulmonology appointment.    Patient Self-Care Activities:  Attend all scheduled provider appointments Call provider office for new concerns or questions  Take medications as prescribed   Continue to wear oxygen  as prescribed and monitor Blood pressure, pulse,  oxygen  saturation and weights. Notify provider if outside of recommended range.   Plan:  Telephone follow up appointment with care management team member scheduled for:  05/16/24 at 1:30 pm      Please call the Suicide and Crisis Lifeline: 988 call the [place]  National Suicide Prevention Lifeline: 941-113-2590 or TTY: 450-706-3136 TTY (231) 096-7367) to talk to a trained counselor if you are experiencing a Mental Health or Behavioral Health Crisis or need someone to talk to.  Patient verbalizes understanding of instructions and care plan provided today and agrees to view in MyChart. Active MyChart status and patient understanding of how to access instructions and care plan via MyChart confirmed with patient.     Heddy Shutter, RN, MSN, BSN, CCM [place]  [facility], Population Health Case Manager Phone: [phone]

## 2024-04-15 NOTE — Patient Outreach (Signed)
 Complex Care Management   Visit Note  04/15/2024  Name:  Paula Ward MRN: [id number] DOB: 01/23/1936  Situation: Referral received for Complex Care Management related to Heart Failure, ILD, pulmonary HTN I obtained verbal consent from Patient.  Visit completed with patient  on the phone  Background:   Past Medical History:  Diagnosis Date   Anemia    Angiodysplasia of stomach and duodenum    Aortic stenosis    Arthus phenomenon    AVM (arteriovenous malformation)    Blood transfusion without reported diagnosis    Breast cancer (HCC) 1989   Left   Candida esophagitis (HCC)    Cataract    Chronic respiratory failure (HCC)    CKD stage 3b, GFR 30-44 ml/min (HCC)    Corneal edema    Corneal epithelial basement membrane dystrophy    CREST syndrome (HCC)    GERD (gastroesophageal reflux disease)    w/ HH   Interstitial lung disease (HCC)    Nodule of kidney    Pericardial effusion    PONV (postoperative nausea and vomiting)    Pulmonary embolus (HCC) 2003   Pulmonary hypertension (HCC)    Rectal incontinence    Renal cell carcinoma (HCC)    Scleroderma (HCC)    Tubular adenoma of colon    Uterine polyp     Assessment: Paitent reports she is improved. She reports she is using sample Yupelri  and states she has improved since starting Bipap. She reports O2 at 4L at this time and states that she is only SOB with minimal exertion.  Patient Reported Symptoms:  Cognitive Cognitive Status: Alert and oriented to person, place, and time      Neurological Neurological Review of Symptoms: No symptoms reported    HEENT HEENT Symptoms Reported: No symptoms reported      Cardiovascular   Weight: 95 lb 11.2 oz (43.4 kg)  Respiratory Other Respiratory Symptoms: patient reports she has improved a lot on Bipap. she states pulmonology has provided Yupelri  samples. she reports oxygen  requirments have decreased to 4L/[place], O2 sat 99-100% on 4L [place]. Patient reports improved breathing only a  little SOB with exertion.    Endocrine Patient reports the following symptoms related to hypoglycemia or hyperglycemia : No symptoms reported    Gastrointestinal Gastrointestinal Symptoms Reported: No symptoms reported      Genitourinary Genitourinary Symptoms Reported: No symptoms reported    Integumentary Integumentary Symptoms Reported: No symptoms reported    Musculoskeletal Musculoskelatal Symptoms Reviewed: No symptoms reported Additional Musculoskeletal Details: walks with cane        Psychosocial Psychosocial Symptoms Reported: No symptoms reported            02/05/2024   10:38 AM  Depression screen PHQ 2/9  Decreased Interest 1  Down, Depressed, Hopeless 0  PHQ - 2 Score 1    Vitals:   04/15/24 1538  SpO2: 99%    Medications Reviewed Today     Reviewed by Jeancarlos Marchena M, RN (Registered Nurse) on 04/15/24 at 1532  Med List Status: <None>   Medication Order Taking? Sig Documenting Provider Last Dose Status Informant  acetaminophen  (TYLENOL ) 325 MG tablet [id number] Yes Take 2 tablets (650 mg total) by mouth every 6 (six) hours as needed for moderate pain. Christopher Savannah, PA-C  Active Self, Pharmacy Records           Med Note ALLEGRA, [facility]   Dju Aug 29, 2023 10:03 PM)    albuterol  (PROVENTIL ) (2.5 MG/3ML) 0.083%  nebulizer solution [id number] Yes Take 3 mLs (2.5 mg total) by nebulization every 6 (six) hours as needed for wheezing or shortness of breath. Sonjia Held, MD  Active Self, Pharmacy Records  albuterol  (VENTOLIN  HFA) [phone] Base) MCG/ACT inhaler [id number] Yes INHALE 2 PUFFS INTO THE LUNGS EVERY 6 HOURS AS NEEDED FOR WHEEZING OR SHORTNESS OF SHERIDA Rollene Almarie DELENA, MD  Active Self, Pharmacy Records  arformoterol  (BROVANA ) 15 MCG/2ML NEBU [id number] Yes Take 2 mLs (15 mcg total) by nebulization 2 (two) times daily. Hunsucker, Donnice SAUNDERS, MD  Active Self, Pharmacy Records  augmented betamethasone dipropionate (DIPROLENE-AF) 0.05 % cream [id number]  Yes Apply 1 application  topically 2 (two) times daily as needed (for irritation). [provider]  Active Self, Pharmacy Records           Med Note ALLEGRA, [facility] I   Sat Aug 29, 2023  9:58 PM)    clobetasol ointment (TEMOVATE) 0.05 % [id number] Yes Apply 1 Application topically 2 (two) times daily as needed (for irritation). [provider]  Active Self, Pharmacy Records           Med Note ALLEGRA, [facility] I   Sat Aug 29, 2023  9:59 PM)    diltiazem  (CARDIZEM ) 30 MG tablet [id number] Yes Take 1 tablet (30 mg total) by mouth every 12 (twelve) hours. Emelia Josefa HERO, NP  Active Self, Pharmacy Records  docusate sodium  (COLACE) 100 MG capsule [id number] Yes Take 100 mg by mouth daily. [provider]  Active Self, Pharmacy Records  empagliflozin  (JARDIANCE ) 10 MG TABS tablet [id number]  Take 1 tablet (10 mg total) by mouth daily.  Patient not taking: Reported on 04/15/2024   Patsy Lenis, MD  Active Self, Pharmacy Records  famotidine  (PEPCID ) 20 MG tablet [id number] Yes TAKE 1 TABLET BY MOUTH AT BEDTIME Nandigam, Kavitha V, MD  Active   fluticasone  (FLONASE ) 50 MCG/ACT nasal spray [id number] Yes USE 1 SPRAY(S) IN EACH NOSTRIL ONCE DAILY AS NEEDED FOR ALLERGIES Rollene Almarie DELENA, MD  Active Self, Pharmacy Records  furosemide  (LASIX ) 20 MG tablet [id number] Yes Take 2 tablets ( 40 mg ) every morning may take extra 2 tablets for weight gain of 3 lbs Pietro Redell RAMAN, MD  Active Self, Pharmacy Records  hydrOXYzine  (ATARAX ) 25 MG tablet [id number] Yes Take 1 tablet (25 mg total) by mouth 3 (three) times daily as needed for anxiety. Briana Elgin DELENA, MD  Active   latanoprost  (XALATAN ) 0.005 % ophthalmic solution [id number] Yes Place 1 drop into both eyes at bedtime. [provider]  Active Self, Pharmacy Records  magic mouthwash SOLN [id number] Yes Take 10 mLs by mouth 4 (four) times daily as needed for mouth pain. Jillian Buttery, MD  Active Self, Pharmacy Records            Med Note DELETA, DEBBY SAUNDERS Schaumann Mar 24, 2024 10:43 AM) [place] [facility]  Multiple Vitamin (MULTIVITAMIN WITH MINERALS) TABS tablet [id number] Yes Take 1 tablet by mouth daily. [provider]  Active Self, Pharmacy Records  Nutritional Supplements (ENSURE HIGH PROTEIN) LIQD [id number] Yes Take 0.5 Bottles by mouth daily as needed (for supplementation). [provider]  Active Self, Pharmacy Records  oxyCODONE  (ROXICODONE ) 5 MG/5ML solution [id number]  Take 0.25 mLs (0.25 mg total) by mouth every 3 (three) hours as needed for moderate pain (pain score 4-6) or severe pain (pain score 7-10) (shortness of breath).  Patient not taking: Reported on 04/15/2024   Briana Elgin DELENA, MD  Active   OXYGEN  [id number]  Inhale 6 L/min into the lungs continuous. [provider]  Active Self, Pharmacy Records           Med Note MARISA, NATHANEL SAILOR   Wed Mar 23, 2024  7:28 PM)    pantoprazole  (PROTONIX ) 40 MG tablet [id number] Yes TAKE 1 TABLET BY MOUTH TWICE DAILY BEFORE A MEAL Nandigam, Kavitha V, MD  Active Self, Pharmacy Records  revefenacin  (YUPELRI ) 175 MCG/3ML nebulizer solution [id number]  Take 3 mLs (175 mcg total) by nebulization daily. Hunsucker, Donnice SAUNDERS, MD  Active Self, Pharmacy Records  revefenacin  (YUPELRI ) 175 MCG/3ML nebulizer solution [id number] Yes Take 3 mLs (175 mcg total) by nebulization daily. Hope Almarie ORN, NP  Active   spironolactone  (ALDACTONE ) 25 MG tablet [id number] Yes Take 1/2 tablet ( 12.5 mg ) every morning Pietro Redell RAMAN, MD  Active Self, Pharmacy Records           Med Note DELETA, [place] [facility]   Thu Mar 24, 2024 10:26 AM) On 03/22/24 she was told to take 1 tablet daily   sucralfate  (CARAFATE ) 1 GM/10ML suspension [id number] Yes TAKE  10 ML BY MOUTH EVERY 6 HOURS AS NEEDED FOR REFLUX Nandigam, Kavitha V, MD  Active Self, Pharmacy Records  triamcinolone  cream (KENALOG ) 0.1 % [id number] Yes Apply 1 Application topically See admin instructions.  Apply to affected leg(s) after showers and up to 2 additional times a day as needed for irritation [provider]  Active Self, Pharmacy Records           Med Note [facility] I   Sat Aug 29, 2023 10:03 PM)            Recommendation:   Continue Current Plan of Care  Follow Up Plan:   Telephone follow up appointment date/time:  05/16/24 at 1;30 pm  Heddy Shutter, RN, MSN, BSN, CCM [place]  [facility], Population Health Case Manager Phone: [phone]

## 2024-04-18 ENCOUNTER — Other Ambulatory Visit: Payer: Self-pay | Admitting: Primary Care

## 2024-04-18 ENCOUNTER — Other Ambulatory Visit: Payer: Self-pay | Admitting: Gastroenterology

## 2024-04-18 DIAGNOSIS — J449 Chronic obstructive pulmonary disease, unspecified: Secondary | ICD-10-CM | POA: Diagnosis not present

## 2024-04-18 DIAGNOSIS — J9620 Acute and chronic respiratory failure, unspecified whether with hypoxia or hypercapnia: Secondary | ICD-10-CM | POA: Diagnosis not present

## 2024-04-18 MED ORDER — YUPELRI 175 MCG/3ML IN SOLN: 175.0000 ug | Freq: Every day | RESPIRATORY_TRACT | 6 refills | Status: DC

## 2024-04-18 NOTE — Telephone Encounter (Signed)
 This was already done. Terri with [facility] stated she has access to epic and was already on the look out for this. I will print this out and send it to [facility] to be sure.

## 2024-04-18 NOTE — Telephone Encounter (Signed)
 It;s Yupelri , I gave her samples RX usually comes from a specialty

## 2024-04-18 NOTE — Progress Notes (Signed)
 Rx sent for Yupelri 

## 2024-04-19 ENCOUNTER — Encounter: Payer: Self-pay | Admitting: Internal Medicine

## 2024-04-19 ENCOUNTER — Inpatient Hospital Stay: Admitting: Internal Medicine

## 2024-04-19 ENCOUNTER — Ambulatory Visit (INDEPENDENT_AMBULATORY_CARE_PROVIDER_SITE_OTHER): Admitting: Internal Medicine

## 2024-04-19 ENCOUNTER — Other Ambulatory Visit: Payer: Self-pay | Admitting: Hematology and Oncology

## 2024-04-19 VITALS — BP 114/48 | HR 77 | Temp 97.9°F | Ht 63.0 in | Wt 99.0 lb

## 2024-04-19 DIAGNOSIS — I5032 Chronic diastolic (congestive) heart failure: Secondary | ICD-10-CM

## 2024-04-19 DIAGNOSIS — F4321 Adjustment disorder with depressed mood: Secondary | ICD-10-CM

## 2024-04-19 DIAGNOSIS — D638 Anemia in other chronic diseases classified elsewhere: Secondary | ICD-10-CM

## 2024-04-19 DIAGNOSIS — J9621 Acute and chronic respiratory failure with hypoxia: Secondary | ICD-10-CM | POA: Diagnosis not present

## 2024-04-19 DIAGNOSIS — D649 Anemia, unspecified: Secondary | ICD-10-CM

## 2024-04-19 DIAGNOSIS — N1832 Chronic kidney disease, stage 3b: Secondary | ICD-10-CM

## 2024-04-19 DIAGNOSIS — E538 Deficiency of other specified B group vitamins: Secondary | ICD-10-CM

## 2024-04-19 DIAGNOSIS — J9622 Acute and chronic respiratory failure with hypercapnia: Secondary | ICD-10-CM | POA: Diagnosis not present

## 2024-04-19 LAB — CBC
HCT: 26.7 % — ABNORMAL LOW (ref 36.0–46.0)
Hemoglobin: 8.9 g/dL — ABNORMAL LOW (ref 12.0–15.0)
MCHC: 33.5 g/dL (ref 30.0–36.0)
MCV: 107.5 fl — ABNORMAL HIGH (ref 78.0–100.0)
Platelets: 152 10*3/uL (ref 150.0–400.0)
RBC: 2.48 Mil/uL — ABNORMAL LOW (ref 3.87–5.11)
RDW: 12.8 % (ref 11.5–15.5)
WBC: 4.7 10*3/uL (ref 4.0–10.5)

## 2024-04-19 LAB — COMPREHENSIVE METABOLIC PANEL WITH GFR
ALT: 10 U/L (ref 0–35)
AST: 22 U/L (ref 0–37)
Albumin: 4.3 g/dL (ref 3.5–5.2)
Alkaline Phosphatase: 58 U/L (ref 39–117)
BUN: 64 mg/dL — ABNORMAL HIGH (ref 6–23)
CO2: 38 meq/L — ABNORMAL HIGH (ref 19–32)
Calcium: 9.9 mg/dL (ref 8.4–10.5)
Chloride: 92 meq/L — ABNORMAL LOW (ref 96–112)
Creatinine, Ser: 1.32 mg/dL — ABNORMAL HIGH (ref 0.40–1.20)
GFR: 36.78 mL/min — ABNORMAL LOW (ref >60.00)
Glucose, Bld: 113 mg/dL — ABNORMAL HIGH (ref 70–99)
Potassium: 4.9 meq/L (ref 3.5–5.1)
Sodium: 136 meq/L (ref 135–145)
Total Bilirubin: 0.6 mg/dL (ref 0.2–1.2)
Total Protein: 7.1 g/dL (ref 6.0–8.3)

## 2024-04-19 MED ORDER — HYDROXYZINE HCL 10 MG PO TABS
10.0000 mg | ORAL_TABLET | Freq: Three times a day (TID) | ORAL | 0 refills | Status: DC | PRN
Start: 2024-04-19 — End: 2024-06-15

## 2024-04-19 NOTE — Telephone Encounter (Signed)
 Yupelri  Rx sent to [facility]. I called and spoke to pt. Pt informed and verbalized understanding. NFN

## 2024-04-19 NOTE — Telephone Encounter (Signed)
 Thanks! NFN

## 2024-04-19 NOTE — Assessment & Plan Note (Signed)
 Stable overall and getting new bipap machine today which she uses at night time.

## 2024-04-19 NOTE — Assessment & Plan Note (Signed)
 Checking CMP since here last with cardiology was worse than usual.

## 2024-04-19 NOTE — Assessment & Plan Note (Signed)
 Hydroxyzine  25 mg from hospital is too strong. She is taking 1/2 and still drowsy afterwards. Rx hydroxyzine  10 mg and wil take 1/2 for anxiety as needed.

## 2024-04-19 NOTE — Progress Notes (Signed)
   Subjective:   Patient ID: Paula Ward, female    DOB: 11-Jul-1936, 88 y.o.   MRN: [id number]  HPI The patient is an 88 YO female coming in for hospital follow up (in for respiratory failure and discharged home with hospice). Is using bipap and new nebulizers from pulmonary and has stabilized. She feels in her normal state of health with less SOB since nebulizer changes. Denies constipation and diarrhea. No nausea but poor appetite.   PMH, [facility], social history reviewed and updated  Review of Systems  Constitutional:  Positive for activity change and fatigue.  HENT: Negative.    Eyes: Negative.   Respiratory:  Negative for cough, chest tightness and shortness of breath.   Cardiovascular:  Negative for chest pain, palpitations and leg swelling.  Gastrointestinal:  Negative for abdominal distention, abdominal pain, constipation, diarrhea, nausea and vomiting.  Musculoskeletal:  Positive for gait problem.  Skin: Negative.   Neurological:  Negative for dizziness, light-headedness and headaches.  Psychiatric/Behavioral: Negative.      Objective:  Physical Exam Constitutional:      Appearance: She is well-developed.  HENT:     Head: Normocephalic and atraumatic.   Cardiovascular:     Rate and Rhythm: Normal rate and regular rhythm.  Pulmonary:     Effort: Pulmonary effort is normal. No respiratory distress.     Breath sounds: Normal breath sounds. No wheezing or rales.     Comments: Oxygen  in place via cannula Abdominal:     General: Bowel sounds are normal. There is no distension.     Palpations: Abdomen is soft.     Tenderness: There is no abdominal tenderness. There is no rebound.   Musculoskeletal:     Cervical back: Normal range of motion.   Skin:    General: Skin is warm and dry.   Neurological:     Mental Status: She is alert and oriented to person, place, and time.     Coordination: Coordination abnormal.     Comments: Wheelchair due to dyspnea    Vitals:    04/19/24 1421  BP: (!) 114/48  Pulse: 77  Temp: 97.9 F (36.6 C)  TempSrc: Oral  SpO2: 97%  Weight: 99 lb (44.9 kg)  Height: 5' 3 (1.6 m)    Assessment & Plan:

## 2024-04-19 NOTE — Addendum Note (Signed)
 Addended by: Janeisha Ryle T on: 04/19/2024 02:32 PM   Modules accepted: Orders

## 2024-04-19 NOTE — Telephone Encounter (Signed)
 I sent Rx to her pharmacy, ashlyn can you please send Yupelri  prescription to [facility]

## 2024-04-19 NOTE — Telephone Encounter (Signed)
 Per Tommye at [facility]- We have not received an order electronically yet.

## 2024-04-19 NOTE — Patient Instructions (Signed)
 We will check the labs  We have sent in the anxiety pill hydroxyzine  10 mg to take 1/2 as needed.

## 2024-04-19 NOTE — Assessment & Plan Note (Signed)
 Minimal edema legs and taking lasix  and spironolactone . Checking CMP and adjust as needed.

## 2024-04-20 ENCOUNTER — Ambulatory Visit: Payer: Self-pay | Admitting: Internal Medicine

## 2024-04-20 ENCOUNTER — Inpatient Hospital Stay: Attending: Physician Assistant

## 2024-04-20 ENCOUNTER — Inpatient Hospital Stay

## 2024-04-20 ENCOUNTER — Telehealth: Payer: Self-pay

## 2024-04-20 DIAGNOSIS — E538 Deficiency of other specified B group vitamins: Secondary | ICD-10-CM | POA: Insufficient documentation

## 2024-04-20 DIAGNOSIS — D539 Nutritional anemia, unspecified: Secondary | ICD-10-CM | POA: Insufficient documentation

## 2024-04-20 NOTE — Telephone Encounter (Signed)
 CRM - received / signed / faxed

## 2024-04-29 ENCOUNTER — Telehealth: Payer: Self-pay

## 2024-04-29 ENCOUNTER — Other Ambulatory Visit (HOSPITAL_COMMUNITY): Payer: Self-pay

## 2024-04-29 NOTE — Telephone Encounter (Signed)
*  Pulm  Pharmacy Patient Advocate Encounter   Received notification from CoverMyMeds that prior authorization for Yupelri  175MCG/3ML solution  is required/requested.   Insurance verification completed.   The patient is insured through [employer] .   Per test claim: PA required; PA submitted to above mentioned insurance via CoverMyMeds Key/confirmation #/EOC [id number] Status is pending

## 2024-04-29 NOTE — Telephone Encounter (Signed)
 You asked for the drug above for your chronic respiratory failure with hypercapnia and hypoxia. This is an off-label use that is not medically accepted. The Medicare rule in the Prescription Drug Benefit Manual (Chapter 6, Section 10.6) says a drug must be used for a medically accepted indication (covered use). Off-label use is medically accepted when there is proof in one or more of the drug guides that the drug works for your condition. We look at the two major drug guides (compendia): the DRUGDEX Information System and the [facility] Formulary Service Drug Information (AHFS-DI). [employer] has decided that there is no proof in either drug guide that this drug works for your condition. Per Medicare rules, it is not covered.

## 2024-04-29 NOTE — Telephone Encounter (Signed)
 Copied from CRM [id number]. Topic: General - Other >> Apr 29, 2024  2:01 PM Aisha D wrote: Reason for CRM: Pt is calling to confirm that the office has the 2 letters she dropped off at the office for her and her husband. Pt stated that the letters need to be signed so they can come picked them up. Pt would like a callback with an update on this concern.

## 2024-05-01 DIAGNOSIS — I509 Heart failure, unspecified: Secondary | ICD-10-CM | POA: Diagnosis not present

## 2024-05-02 ENCOUNTER — Other Ambulatory Visit: Payer: Self-pay | Admitting: Gastroenterology

## 2024-05-02 ENCOUNTER — Ambulatory Visit: Admitting: Pulmonary Disease

## 2024-05-03 ENCOUNTER — Ambulatory Visit: Admitting: Pulmonary Disease

## 2024-05-03 ENCOUNTER — Encounter: Payer: Self-pay | Admitting: Pulmonary Disease

## 2024-05-03 ENCOUNTER — Other Ambulatory Visit

## 2024-05-03 VITALS — BP 138/55 | HR 84 | Ht 63.0 in | Wt 100.6 lb

## 2024-05-03 DIAGNOSIS — E8729 Other acidosis: Secondary | ICD-10-CM

## 2024-05-03 DIAGNOSIS — R0609 Other forms of dyspnea: Secondary | ICD-10-CM

## 2024-05-03 DIAGNOSIS — J4540 Moderate persistent asthma, uncomplicated: Secondary | ICD-10-CM | POA: Diagnosis not present

## 2024-05-03 LAB — BASIC METABOLIC PANEL WITH GFR
BUN: 57 mg/dL — ABNORMAL HIGH (ref 6–23)
CO2: 36 meq/L — ABNORMAL HIGH (ref 19–32)
Calcium: 9.4 mg/dL (ref 8.4–10.5)
Chloride: 94 meq/L — ABNORMAL LOW (ref 96–112)
Creatinine, Ser: 1.38 mg/dL — ABNORMAL HIGH (ref 0.40–1.20)
GFR: 34.86 mL/min — ABNORMAL LOW (ref >60.00)
Glucose, Bld: 76 mg/dL (ref 70–99)
Potassium: 4.4 meq/L (ref 3.5–5.1)
Sodium: 137 meq/L (ref 135–145)

## 2024-05-03 MED ORDER — YUPELRI 175 MCG/3ML IN SOLN
175.0000 ug | Freq: Every day | RESPIRATORY_TRACT | 6 refills | Status: DC
Start: 2024-05-03 — End: 2024-05-16

## 2024-05-03 NOTE — Progress Notes (Signed)
 @Patient  ID: Paula Ward, female    DOB: Apr 25, 1936, 88 y.o.   MRN: [id number]  Chief Complaint  Patient presents with   Medical Management of Chronic Issues    Hospital follow up, no concerns.    Referring provider: Rollene Almarie LABOR, *  HPI:   88 y.o. 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   Most recent cardiology note reviewed.    Hospitalized 03/2024 discharge.  Hypercarbic.  Eventually improved with ventilator, NIPPV.  Was discharged home on hospice.  Subsequently discharged hospice given she was feeling better.  In interim her utilities been denied.  We can try again.  She can use on arformoterol .  She finds NIPPV at night to be helpful.  HPI at initial visit: Patient here for evaluation of pulmonary hypertension.  Multiple hospitalizations for decompensated CHF left-sided disease, pulmonary venous hypertension.  As evidenced by serial images with pleural effusions and pulmonary edema.  Her echocardiogram over time is showing a severely dilated left atrium with valvular dysfunction as well as diastolic dysfunction.  Fortunately her RV functional serial echocardiograms look fine.  Her most recent right heart catheterization LVEDP of 18, PVR of 2 Wood units.  Her prior right heart catheterization at [facility] years prior with a PVR less than 2.  Both times on Opsumit .  Her initial cath 2001 with PVR 5.  We discussed at length the etiology of her pulmonary hypertension.  Has evolved over time.  The changing physiology of her pulmonary hypertension as a relates to her left-sided disease and valvular disease and likely worsening on pulmonary vasodilators.  She expressed understanding.  She is feeling well since discharge.  Feels better off the Opsumit .  Discussed how VQ mismatch could worsen on Opsumit  given her interstitial lung disease.   Overall her hypoxemia seems improved.  She walked in the clinic today and immediate after sitting down with send saturation was 94%.  We discussed ongoing oxygen  use with exertion that she is to monitor at home as discussed below.   Questionaires / Pulmonary Flowsheets:   ACT:      No data to display          MMRC:     No data to display          Epworth:      No data to display          Tests:   FENO:  No results found for: NITRICOXIDE  PFT:    Latest Ref Rng & Units 04/12/2024    2:06 PM 08/25/2023    2:45 PM 02/03/2022   11:01 AM  PFT Results  FVC-Pre L 1.04  0.96  1.34   FVC-Predicted Pre % 46  42  56   FVC-Post L 1.00  0.91    FVC-Predicted Post % 44  40    Pre FEV1/FVC % % 89  79  84   Post FEV1/FCV % % 91  83    FEV1-Pre L 0.92  0.76  1.12   FEV1-Predicted Pre % 55  45  64   FEV1-Post L 0.92  0.76    DLCO uncorrected ml/min/mmHg 6.29  7.39  10.32   DLCO UNC% % 35  41  57   DLCO corrected ml/min/mmHg  8.46  11.49   DLCO COR %Predicted %  47  63   DLVA Predicted % 58  119  119   TLC L 2.43  3.02  2.88   TLC % Predicted % 49  61  58   RV % Predicted % 58  86  62   Personally reviewed and interpreted at time spirometry suggestive of severe restriction versus air trapping, no bronchodilator response.  Lung volumes consistent with severe restriction, DLCO severely reduced.  WALK:     07/15/2023    4:27 PM 03/13/2022    3:15 PM 01/06/2022   12:08 PM  SIX MIN WALK  Supplimental Oxygen  during Test? (L/min) No    2 Minute Oxygen  Saturation %  90 % 89 %  2 Minute HR  107 111  4 Minute Oxygen  Saturation %  89 % 93 %  4 Minute HR  111 113  6 Minute Oxygen  Saturation %  88 % 92 %  6 Minute HR  108 118  Tech Comments: patient walked one lap with no complaints, O2 sat 88%, patient aware to use POC 2L w/exertion/activity      Imaging: Personally reviewed and as per EMR and discussion in this note No results found.   Lab Results: Personally  reviewed CBC    Component Value Date/Time   WBC 4.7 04/19/2024 1453   RBC 2.48 Repeated and verified X2. (L) 04/19/2024 1453   HGB 8.9 Repeated and verified X2. (L) 04/19/2024 1453   HGB 8.8 (L) 03/23/2024 1439   HGB 11.2 04/21/2022 0940   HCT 26.7 Repeated and verified X2. (L) 04/19/2024 1453   HCT 32.7 (L) 04/21/2022 0940   PLT 152.0 04/19/2024 1453   PLT 118 (L) 03/23/2024 1439   PLT 242 04/21/2022 0940   MCV 107.5 (H) 04/19/2024 1453   MCV 104 (H) 04/21/2022 0940   MCH 35.9 (H) 03/24/2024 0311   MCHC 33.5 04/19/2024 1453   RDW 12.8 04/19/2024 1453   RDW 13.1 04/21/2022 0940   LYMPHSABS 0.7 03/23/2024 1730   MONOABS 0.3 03/23/2024 1730   EOSABS 0.3 03/23/2024 1730   BASOSABS 0.0 03/23/2024 1730    BMET    Component Value Date/Time   NA 136 04/19/2024 1453   NA 138 04/08/2024 1359   K 4.9 04/19/2024 1453   CL 92 (L) 04/19/2024 1453   CO2 38 (H) 04/19/2024 1453   GLUCOSE 113 (H) 04/19/2024 1453   GLUCOSE 101 (H) 10/02/2006 0845   BUN 64 (H) 04/19/2024 1453   BUN 52 (H) 04/08/2024 1359   CREATININE 1.32 (H) 04/19/2024 1453   CREATININE 1.56 (H) 03/23/2024 1439   CALCIUM  9.9 04/19/2024 1453   GFRNONAA 41 (L) 03/24/2024 0311   GFRNONAA 32 (L) 03/23/2024 1439   GFRAA >60 05/24/2018 0652    BNP    Component Value Date/Time   BNP 2,711.9 (H) 03/23/2024 2023    ProBNP    Component Value Date/Time   PROBNP 2,680.0 (H) 06/03/2023 1200    Specialty Problems       Pulmonary Problems   ILD (interstitial lung disease) (HCC)   Qualifier: Diagnosis of  By: Nickola CMA, [place]        Allergic rhinitis   Globus pharyngeus   Chronic respiratory failure with hypoxia (HCC)   Influenza A   Acute on chronic respiratory failure with hypoxia and hypercapnia (HCC)    Allergies  Allergen Reactions   Codeine  Nausea Only    Hallucinations, too   Other Nausea And Vomiting and Other (See Comments)    -mycin antibiotics.   Also cause hallucinations.   Erythromycin  Nausea And  Vomiting   Iodinated Contrast Media Other (See Comments)    Renal issues   Lisinopril Other (See Comments)    Pt doesn't remember reaction   Mirtazapine  Other (See Comments)    Threw me into orbit   Mycophenolate Mofetil Nausea Only    Made me nervous   Sulfa  Antibiotics Other (See Comments)    Unknown reaction    Immunization History  Administered Date(s) Administered   Fluad Quad(high Dose 65+) 07/24/2020   Influenza, High Dose Seasonal PF 07/07/2017, 06/21/2019   Influenza-Unspecified 07/15/2013, 02/16/2015, 07/19/2015, 07/18/2016, 08/20/2018, 06/24/2021, 08/03/2022   Moderna Sars-Covid-2 Vaccination 11/01/2019, 11/29/2019, 09/14/2020, 02/16/2021   Pneumococcal Conjugate-13 01/30/2014   Pneumococcal Polysaccharide-23 08/30/2007   Respiratory Syncytial Virus Vaccine ,Recomb Aduvanted(Arexvy ) 09/24/2022   Tdap 06/26/2011, 06/07/2018   Zoster, Live 08/15/2014    Past Medical History:  Diagnosis Date   Anemia    Angiodysplasia of stomach and duodenum    Aortic stenosis    Arthus phenomenon    AVM (arteriovenous malformation)    Blood transfusion without reported diagnosis    Breast cancer (HCC) 1989   Left   Candida esophagitis (HCC)    Cataract    Chronic respiratory failure (HCC)    CKD stage 3b, GFR 30-44 ml/min (HCC)    Corneal edema    Corneal epithelial basement membrane dystrophy    CREST syndrome (HCC)    GERD (gastroesophageal reflux disease)    w/ HH   Interstitial lung disease (HCC)    Nodule of kidney    Pericardial effusion    PONV (postoperative nausea and vomiting)    Pulmonary embolus (HCC) 2003   Pulmonary hypertension (HCC)    Rectal incontinence    Renal cell carcinoma (HCC)    Scleroderma (HCC)    Tubular adenoma of colon    Uterine polyp     Tobacco History: Social History   Tobacco Use  Smoking Status Never  Smokeless Tobacco Never   Counseling given: Not Answered   Continue to not smoke  Outpatient Encounter  Medications as of 05/03/2024  Medication Sig   acetaminophen  (TYLENOL ) 325 MG tablet Take 2 tablets (650 mg total) by mouth every 6 (six) hours as needed for moderate pain.   albuterol  (PROVENTIL ) (2.5 MG/3ML) 0.083% nebulizer solution Take 3 mLs (2.5 mg total) by nebulization every 6 (six) hours as needed for wheezing or shortness of breath.   albuterol  (VENTOLIN  HFA) 108 (90 Base) MCG/ACT inhaler INHALE 2 PUFFS INTO THE LUNGS EVERY 6 HOURS AS NEEDED FOR WHEEZING OR SHORTNESS OF BREATH   arformoterol  (BROVANA ) 15 MCG/2ML NEBU Take 2 mLs (15 mcg total) by nebulization 2 (two) times daily.   augmented betamethasone dipropionate (DIPROLENE-AF) 0.05 % cream Apply 1 application  topically 2 (two) times daily as needed (for irritation).   clobetasol ointment (TEMOVATE) 0.05 % Apply 1 Application topically 2 (two) times daily as needed (for irritation).   diltiazem  (CARDIZEM ) 30 MG tablet Take 1 tablet (30 mg total) by mouth every 12 (twelve) hours.   docusate sodium  (COLACE) 100 MG capsule Take 100 mg by mouth daily.   empagliflozin  (JARDIANCE ) 10 MG TABS tablet Take 1 tablet (10 mg total) by mouth daily.   famotidine  (PEPCID ) 20 MG tablet TAKE 1 TABLET BY MOUTH AT BEDTIME   fluticasone  (FLONASE ) 50 MCG/ACT nasal spray USE 1 SPRAY(S) IN EACH NOSTRIL ONCE DAILY AS NEEDED FOR ALLERGIES   furosemide  (LASIX ) 20 MG tablet Take 2 tablets ( 40 mg ) every morning may take extra 2  tablets for weight gain of 3 lbs   hydrOXYzine  (ATARAX ) 10 MG tablet Take 1 tablet (10 mg total) by mouth 3 (three) times daily as needed for anxiety.   latanoprost  (XALATAN ) 0.005 % ophthalmic solution Place 1 drop into both eyes at bedtime.   magic mouthwash SOLN Take 10 mLs by mouth 4 (four) times daily as needed for mouth pain.   Multiple Vitamin (MULTIVITAMIN WITH MINERALS) TABS tablet Take 1 tablet by mouth daily.   Nutritional Supplements (ENSURE HIGH PROTEIN) LIQD Take 0.5 Bottles by mouth daily as needed (for supplementation).    oxyCODONE  (ROXICODONE ) 5 MG/5ML solution Take 0.25 mLs (0.25 mg total) by mouth every 3 (three) hours as needed for moderate pain (pain score 4-6) or severe pain (pain score 7-10) (shortness of breath).   OXYGEN  Inhale 6 L/min into the lungs continuous.   pantoprazole  (PROTONIX ) 40 MG tablet TAKE 1 TABLET BY MOUTH TWICE DAILY BEFORE A MEAL   revefenacin  (YUPELRI ) 175 MCG/3ML nebulizer solution Take 3 mLs (175 mcg total) by nebulization daily.   revefenacin  (YUPELRI ) 175 MCG/3ML nebulizer solution Take 3 mLs (175 mcg total) by nebulization daily.   spironolactone  (ALDACTONE ) 25 MG tablet Take 1/2 tablet ( 12.5 mg ) every morning   sucralfate  (CARAFATE ) 1 GM/10ML suspension TAKE 10 MLS BY MOUTH EVERY 6 HOURS AS NEEDED FOR REFLUX   triamcinolone  cream (KENALOG ) 0.1 % Apply 1 Application topically See admin instructions. Apply to affected leg(s) after showers and up to 2 additional times a day as needed for irritation   No facility-administered encounter medications on file as of 05/03/2024.     Review of Systems  Review of Systems  N/a Physical Exam  BP (!) 138/55   Pulse 84   Ht 5' 3 (1.6 m)   Wt 100 lb 9.6 oz (45.6 kg)   SpO2 91% Comment: 3L PULSE  BMI 17.82 kg/m   Wt Readings from Last 5 Encounters:  05/03/24 100 lb 9.6 oz (45.6 kg)  04/19/24 99 lb (44.9 kg)  04/15/24 95 lb 11.2 oz (43.4 kg)  04/06/24 100 lb 9.6 oz (45.6 kg)  03/26/24 100 lb 1.4 oz (45.4 kg)    BMI Readings from Last 5 Encounters:  05/03/24 17.82 kg/m  04/19/24 17.54 kg/m  04/15/24 16.95 kg/m  04/06/24 17.82 kg/m  03/26/24 17.73 kg/m     Physical Exam General: Sitting in chair, frail, elderly Eyes: EOMI, no icterus Neck: Supple, no JVP Pulmonary: Crackles in bilateral bases, otherwise clear, distant, on 3 L pulsed via POC Cardiovascular: Warm, trace edema to above ankles bilaterally Abdomen: Nondistended, bowel sounds present MSK: No synovitis, joint effusion is noted at the MCP  joints Neuro: Normal gait, no weakness Psych: Normal mood, full affect patient   Assessment & Plan:   Pulmonary hypertension: Documented on the right heart cath first diagnosed 2001.  Serial markers reviewed.  Multifactorial nature of disease primarily group 2 and 3 disease given elevated wedge pressure and mild ILD, hypoxemia.  At time of initial cath in 2001 PVR was elevated over 5 for group 1 disease likely was primary contributor back then, driven by scleroderma and CTD.  However, has a physiology of her heart has changed over time with diastolic dysfunction, aortic stenosis, mitral valve stenosis, diastolic dysfunction and development of left atrial hypertension, pulmonary venous hypertension, suspect systemic pulmonary vasodilators has led to exacerbation of left-sided congestive heart failure.  Multiple admissions for CHF with signs of pulmonary edema and pleural effusions again indicating pulmonary venous hypertension.  Her most recent catheterizations have indicated a PVR less than 3, admittedly on ERA, Opsumit .  However given left-sided disease and left-sided lower pulmonary venous hypertension, we stopped her Opsumit .  She is feeling better.  Possibly contributing to worsening VQ mismatch as well in the setting of her ILD.  Frankly, given her frailty and complex and multifactorial disease, additional pulmonary vasodilators are likely not in her best interest.  She remains stable off Opsumit , weight stable, decreasing diuretic doses via her cardiology office.  Further titration of diuretics via cardiology.  Stressed importance of low-salt diet and diuretic therapy.  She expressed understanding.  Fortunately RV function on most recent echocardiogram was excellent.  Chronic hypoxemic and hypercarbic respiratory failure: Likely multifactorial but primarily driven by CT ILD.  Encouraged to check oxygen  to keep in the mid 90s if able especially with exertion to avoid hypoxic vasoconstriction which can  worsen acute pulmonary hypertension symptoms are chronically contribute to worsening pulmonary hypertension and RV failure.  On 3 L nasal cannula pulsed via POC today.  To continue NIPPV via trilogy ventilator at night.  She is improved symptomatically with this.  She is clinically benefiting from this.  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  Largely unchanged over serial images over the last several years.  No progression.  In the past has declined antifibrotic's given side effect profile.  Given her age and frailty this is reasonable.  Further imaging felt unnecessary given would not change management.  Could consider in the future if worsening for prognosis etc.   Return in about 6 months (around 11/03/2024) for f/u Dr. Annella.   Donnice JONELLE Annella, MD 05/03/2024

## 2024-05-03 NOTE — Patient Instructions (Signed)
 The Yupelri  is no longer approved and we may not be able to use it, we can try again but it looks like the insurance has denied it  Continue arformoterol  nebulized twice a day  Return to clinic in 6 months or sooner as needed with Dr. Annella

## 2024-05-03 NOTE — Telephone Encounter (Signed)
 If no longer covered then not much I can do. Will let her know at OV later today.

## 2024-05-03 NOTE — Telephone Encounter (Signed)
 This has been on her medication list since a visit in January 2025, can you please send to her primary pulmonologist which I believe is Dr. Annella.

## 2024-05-03 NOTE — Telephone Encounter (Signed)
 Yes it has been received and placed inside provider office box

## 2024-05-06 ENCOUNTER — Ambulatory Visit: Admitting: Family Medicine

## 2024-05-06 ENCOUNTER — Telehealth: Payer: Self-pay

## 2024-05-06 ENCOUNTER — Encounter: Payer: Self-pay | Admitting: Family Medicine

## 2024-05-06 VITALS — Ht 63.0 in

## 2024-05-06 DIAGNOSIS — H6120 Impacted cerumen, unspecified ear: Secondary | ICD-10-CM | POA: Diagnosis not present

## 2024-05-06 NOTE — Patient Instructions (Signed)
 We have cleaned out your ears in the office today.  You may use Debrox at home to try to help keep the wax from building up.  Follow-up with [place] as needed if you are having any persistent symptoms.

## 2024-05-06 NOTE — Telephone Encounter (Signed)
 Patient has forms placed up front that ready to be picked up

## 2024-05-06 NOTE — Progress Notes (Signed)
   Acute Office Visit  Subjective:     Patient ID: Paula Ward, female    DOB: 08-24-1936, 88 y.o.   MRN: [id number]  Chief Complaint  Patient presents with   Ear Fullness    HPI  Discussed the use of AI scribe software for clinical note transcription with the patient, who gave verbal consent to proceed.  History of Present Illness Paula Ward is an 88 year old female who presents with right and left ear fullness      ROS Per HPI      Objective:    Ht 5' 3 (1.6 m)   BMI 17.82 kg/m    Physical Exam Vitals and nursing note reviewed.  Constitutional:      General: She is not in acute distress.    Comments: Chronically ill appearing  HENT:     Head: Normocephalic and atraumatic.     Right Ear: External ear normal. There is impacted cerumen.     Left Ear: External ear normal. There is impacted cerumen.     Nose: Nose normal.     Comments: POC O2 cannula in place    Mouth/Throat:     Mouth: Mucous membranes are moist.     Pharynx: Oropharynx is clear.  Eyes:     Extraocular Movements: Extraocular movements intact.     Pupils: Pupils are equal, round, and reactive to light.  Cardiovascular:     Rate and Rhythm: Normal rate and regular rhythm.     Pulses: Normal pulses.     Heart sounds: Normal heart sounds.  Pulmonary:     Effort: Pulmonary effort is normal.  Musculoskeletal:     Comments: In wheelchair for visit   Lymphadenopathy:     Cervical: No cervical adenopathy.  Neurological:     General: No focal deficit present.     Mental Status: She is alert and oriented to person, place, and time.  Psychiatric:        Mood and Affect: Mood normal.        Thought Content: Thought content normal.     No results found for any visits on 05/06/24.      Assessment & Plan:   Assessment and Plan Assessment & Plan Cerumen Impaction -Bilat ear lavage with good results  Breathing Difficulty Dyspnea unrelieved by nocturnal oxygen , possibly  exacerbated by heat, may indicate chronic respiratory condition.     Return if symptoms worsen or fail to improve.  Corean LITTIE Ku, FNP

## 2024-05-09 ENCOUNTER — Telehealth: Payer: Self-pay | Admitting: Internal Medicine

## 2024-05-09 NOTE — Telephone Encounter (Signed)
 Patient dropped off paperwork to be filled out by provider. Please call patient when ready to pick up.

## 2024-05-12 NOTE — Telephone Encounter (Unsigned)
 Copied from CRM #[id number]. Topic: General - Call Back - No Documentation >> May 12, 2024 10:05 AM Paula Ward wrote: Reason for CRM: Patient returning call from [facility]. Patient would liek a status update on the paperwork she dropped off.

## 2024-05-12 NOTE — Telephone Encounter (Signed)
 Has been placed inside office box

## 2024-05-13 DIAGNOSIS — I10 Essential (primary) hypertension: Secondary | ICD-10-CM | POA: Diagnosis not present

## 2024-05-13 DIAGNOSIS — I5033 Acute on chronic diastolic (congestive) heart failure: Secondary | ICD-10-CM | POA: Diagnosis not present

## 2024-05-14 ENCOUNTER — Ambulatory Visit: Payer: Self-pay | Admitting: Cardiology

## 2024-05-14 LAB — BASIC METABOLIC PANEL WITH GFR
BUN/Creatinine Ratio: 32 — ABNORMAL HIGH (ref 12–28)
BUN: 48 mg/dL — ABNORMAL HIGH (ref 8–27)
CO2: 29 mmol/L (ref 20–29)
Calcium: 9.2 mg/dL (ref 8.7–10.3)
Chloride: 95 mmol/L — ABNORMAL LOW (ref 96–106)
Creatinine, Ser: 1.52 mg/dL — ABNORMAL HIGH (ref 0.57–1.00)
Glucose: 89 mg/dL (ref 70–99)
Potassium: 4.6 mmol/L (ref 3.5–5.2)
Sodium: 139 mmol/L (ref 134–144)
eGFR: 33 mL/min/1.73 — ABNORMAL LOW (ref >59)

## 2024-05-16 ENCOUNTER — Telehealth: Payer: Self-pay

## 2024-05-16 ENCOUNTER — Telehealth

## 2024-05-16 DIAGNOSIS — J4540 Moderate persistent asthma, uncomplicated: Secondary | ICD-10-CM

## 2024-05-16 MED ORDER — YUPELRI 175 MCG/3ML IN SOLN: 175.0000 ug | Freq: Every day | RESPIRATORY_TRACT | 11 refills | Status: DC

## 2024-05-16 NOTE — Telephone Encounter (Signed)
 Which medication is she inquiring about? It is unclear.

## 2024-05-16 NOTE — Telephone Encounter (Signed)
 Copied from CRM [id number]. Topic: Clinical - Prescription Issue >> May 16, 2024  1:41 PM Rilla B wrote: Reason for CRM: Patient is asking to speak with Dr [facility] nurse.  Says she needs the albuterol  inhaler. She states she has to talk to the nurse now. States the nurse needs to appeal it and she a stronger one (??). Please call patient.  ----------------------------------------------------------------------- From previous Reason for Contact - Medication Question: Reason for CRM:    ----------------------------------------------------------------------- From previous Reason for Contact - Other: Reason for CRM: Patient would like a prescrip  Spoke with patient regarding prior message.Patient state she had talked to [facility] at [employer] and they stated for our office to send a script to them and they will help patient get the medication. Advised patient I will send this message to Dr.Hunsucker .  Dr.Hunsucker can you please advise

## 2024-05-16 NOTE — Telephone Encounter (Signed)
 I called and spoke with the pt  She states that she spoke with [employer] and they are trying to get her Yupelri  approved  They need us  to send rx to them for pricing  Sometimes DME can get it cheaper I went ahead and sent rx to them and included the dx code  I told her not sure what the cost will be, but we can try since it's helping her so much  She still has some samples from the last visit  Routing to [facility] as FYI

## 2024-05-17 ENCOUNTER — Other Ambulatory Visit: Payer: Self-pay

## 2024-05-17 NOTE — Patient Outreach (Signed)
 Complex Care Management   Visit Note  05/17/2024  Name:  Paula Ward MRN: [id number] DOB: 08-25-1936  Situation: Referral received for Complex Care Management related to Heart Failure and ILD, and Pulmonary HTN. I obtained verbal consent from Patient.  Visit completed with patient.  on the phone  Background:   Past Medical History:  Diagnosis Date   Anemia    Angiodysplasia of stomach and duodenum    Aortic stenosis    Arthus phenomenon    AVM (arteriovenous malformation)    Blood transfusion without reported diagnosis    Breast cancer (HCC) 1989   Left   Candida esophagitis (HCC)    Cataract    Chronic respiratory failure (HCC)    CKD stage 3b, GFR 30-44 ml/min (HCC)    Corneal edema    Corneal epithelial basement membrane dystrophy    CREST syndrome (HCC)    GERD (gastroesophageal reflux disease)    w/ HH   Interstitial lung disease (HCC)    Nodule of kidney    Pericardial effusion    PONV (postoperative nausea and vomiting)    Pulmonary embolus (HCC) 2003   Pulmonary hypertension (HCC)    Rectal incontinence    Renal cell carcinoma (HCC)    Scleroderma (HCC)    Tubular adenoma of colon    Uterine polyp     Assessment: Patient reports that office visit with Pulmonary completed on 05/03/24. She reports that she is using O2 4-5L per [place] base on activity.  States 02 sats are ranging from 99%-100%. Patient Reported Symptoms:  Cognitive Cognitive Status: No symptoms reported, Alert and oriented to person, place, and time, Insightful and able to interpret abstract concepts, Normal speech and language skills      Neurological Neurological Review of Symptoms: No symptoms reported    HEENT HEENT Symptoms Reported:  (Patient reports that ear fullness is better)      Cardiovascular Cardiovascular Symptoms Reported: No symptoms reported Weight: 99 lb (44.9 kg)  Respiratory Other Respiratory Symptoms: Patients reports that breathing is better.  She is on 4-5L/[place].  O2  sats 99-100% on 4-5L .  Patient reports that she continues to have a few samples of the Yupelri .    Endocrine Endocrine Symptoms Reported: No symptoms reported    Gastrointestinal Gastrointestinal Symptoms Reported: Constipation Additional Gastrointestinal Details: Patient reports she is constipation.  Last bowel movement about a week ago. Patient reports that she has tried Mirlax 2-3 times over the weekend with little results.  Patient reports that she took 1 Ducolox today. RNCM discussed constipation preventation stragetries: increase fiber- fruits and vegatables, prune juice. Patient states she has not been drinking enough water  and will increase water  intake adquequate water  intake. Patient states she will take second Ducolox if no results from the first. Gastrointestinal Management Strategies: Medication therapy, Diet modification, Fluid modification    Genitourinary Genitourinary Symptoms Reported: No symptoms reported    Integumentary Integumentary Symptoms Reported: No symptoms reported    Musculoskeletal Musculoskelatal Symptoms Reviewed: Limited mobility Additional Musculoskeletal Details: walks with a cane. Mobility limited due to shortness of breath        Psychosocial Psychosocial Symptoms Reported: No symptoms reported            02/05/2024   10:38 AM  Depression screen PHQ 2/9  Decreased Interest 1  Down, Depressed, Hopeless 0  PHQ - 2 Score 1    Vitals:   05/17/24 1506  SpO2: 99%    Medications Reviewed Today  Reviewed by Rony Ratz M, RN (Registered Nurse) on 05/17/24 at 1518  Med List Status: <None>   Medication Order Taking? Sig Documenting Provider Last Dose Status Informant  acetaminophen  (TYLENOL ) 325 MG tablet [id number] Yes Take 2 tablets (650 mg total) by mouth every 6 (six) hours as needed for moderate pain. Christopher Savannah, PA-C  Active Self, Pharmacy Records           Med Note ALLEGRA, [place] I   [place] Aug 29, 2023 10:03 PM)    albuterol   (PROVENTIL ) (2.5 MG/3ML) 0.083% nebulizer solution [id number] Yes Take 3 mLs (2.5 mg total) by nebulization every 6 (six) hours as needed for wheezing or shortness of breath. Sonjia Held, MD  Active Self, Pharmacy Records  albuterol  (VENTOLIN  HFA) 108 [phone] Base) MCG/ACT inhaler [id number] Yes INHALE 2 PUFFS INTO THE LUNGS EVERY 6 HOURS AS NEEDED FOR WHEEZING OR SHORTNESS OF SHERIDA Rollene Almarie DELENA, MD  Active Self, Pharmacy Records  arformoterol  (BROVANA ) 15 MCG/2ML NEBU [id number] Yes Take 2 mLs (15 mcg total) by nebulization 2 (two) times daily. Hunsucker, Donnice SAUNDERS, MD  Active Self, Pharmacy Records  augmented betamethasone dipropionate (DIPROLENE-AF) 0.05 % cream [id number]  Apply 1 application  topically 2 (two) times daily as needed (for irritation).  Patient not taking: Reported on 05/17/2024   [provider]  Active Self, Pharmacy Records           Med Note ALLEGRA, [facility] I   Sat Aug 29, 2023  9:58 PM)    clobetasol ointment (TEMOVATE) 0.05 % [id number]  Apply 1 Application topically 2 (two) times daily as needed (for irritation).  Patient not taking: Reported on 05/17/2024   [provider]  Active Self, Pharmacy Records           Med Note ALLEGRA, [facility] I   Sat Aug 29, 2023  9:59 PM)    diltiazem  (CARDIZEM ) 30 MG tablet [id number] Yes Take 1 tablet (30 mg total) by mouth every 12 (twelve) hours. Emelia Josefa HERO, NP  Active Self, Pharmacy Records  docusate sodium  (COLACE) 100 MG capsule [id number] Yes Take 100 mg by mouth daily. [provider]  Active Self, Pharmacy Records  empagliflozin  (JARDIANCE ) 10 MG TABS tablet [id number]  Take 1 tablet (10 mg total) by mouth daily.  Patient not taking: Reported on 05/17/2024   Patsy Lenis, MD  Active Self, Pharmacy Records  famotidine  (PEPCID ) 20 MG tablet [id number] Yes TAKE 1 TABLET BY MOUTH AT BEDTIME Nandigam, Kavitha V, MD  Active   fluticasone  (FLONASE ) 50 MCG/ACT nasal spray [id number] Yes USE 1  SPRAY(S) IN EACH NOSTRIL ONCE DAILY AS NEEDED FOR ALLERGIES Rollene Almarie DELENA, MD  Active Self, Pharmacy Records  furosemide  (LASIX ) 20 MG tablet [id number] Yes Take 2 tablets ( 40 mg ) every morning may take extra 2 tablets for weight gain of 3 lbs Pietro Redell RAMAN, MD  Active Self, Pharmacy Records  hydrOXYzine  (ATARAX ) 10 MG tablet [id number] Yes Take 1 tablet (10 mg total) by mouth 3 (three) times daily as needed for anxiety. Rollene Almarie DELENA, MD  Active   latanoprost  (XALATAN ) 0.005 % ophthalmic solution [id number] Yes Place 1 drop into both eyes at bedtime. [provider]  Active Self, Pharmacy Records  magic mouthwash SOLN [id number] Yes Take 10 mLs by mouth 4 (four) times daily as needed for mouth pain. Jillian Buttery, MD  Active Self, Pharmacy Records           Med Note [place], [place] R   [place]  Mar 24, 2024 10:43 AM) Gets at [facility]  Multiple Vitamin (MULTIVITAMIN WITH MINERALS) TABS tablet [id number]  Take 1 tablet by mouth daily.  Patient not taking: Reported on 05/17/2024   [provider]  Active Self, Pharmacy Records  Nutritional Supplements (ENSURE HIGH PROTEIN) LIQD [id number] Yes Take 0.5 Bottles by mouth daily as needed (for supplementation). [provider]  Active Self, Pharmacy Records  oxyCODONE  (ROXICODONE ) 5 MG/5ML solution [id number]  Take 0.25 mLs (0.25 mg total) by mouth every 3 (three) hours as needed for moderate pain (pain score 4-6) or severe pain (pain score 7-10) (shortness of breath).  Patient not taking: Reported on 05/17/2024   Briana Elgin LABOR, MD  Active   OXYGEN  [id number] Yes Inhale 6 L/min into the lungs continuous.  Patient taking differently: Inhale 6 L/min into the lungs continuous.   [provider]  Active Self, Pharmacy Records           Med Note MARISA, NATHANEL SAILOR   Wed Mar 23, 2024  7:28 PM)    pantoprazole  (PROTONIX ) 40 MG tablet [id number] Yes TAKE 1 TABLET BY MOUTH TWICE DAILY BEFORE A MEAL Nandigam,  Kavitha V, MD  Active Self, Pharmacy Records  revefenacin  (YUPELRI ) 175 MCG/3ML nebulizer solution [id number] Yes Take 3 mLs (175 mcg total) by nebulization daily. Hunsucker, Donnice SAUNDERS, MD  Active   spironolactone  (ALDACTONE ) 25 MG tablet [id number] Yes Take 1/2 tablet ( 12.5 mg ) every morning Pietro Redell RAMAN, MD  Active Self, Pharmacy Records           Med Note DELETA, [place] R   Thu Mar 24, 2024 10:26 AM) On 03/22/24 she was told to take 1 tablet daily   sucralfate  (CARAFATE ) 1 GM/10ML suspension [id number] Yes TAKE 10 MLS BY MOUTH EVERY 6 HOURS AS NEEDED FOR REFLUX Nandigam, Kavitha V, MD  Active   triamcinolone  cream (KENALOG ) 0.1 % [id number] Yes Apply 1 Application topically See admin instructions. Apply to affected leg(s) after showers and up to 2 additional times a day as needed for irritation [provider]  Active Self, Pharmacy Records           Med Note ALLEGRA [place] I   Sat Aug 29, 2023 10:03 PM)            Recommendation:   Specialty provider follow-up with Oncology 05/19/24 and Cardiology 06/14/24 Continue Current Plan of Care  Follow Up Plan:   Telephone follow up appointment date/time:  06/16/2024 at 11 am  Heddy Shutter, RN, MSN, BSN, CCM [place]  [facility], Population Health Case Manager Phone: [phone]

## 2024-05-17 NOTE — Patient Instructions (Signed)
 Visit Information  Thank you for taking time to visit with me today. Please don't hesitate to contact me if I can be of assistance to you before our next scheduled appointment.  Your next care management appointment is by telephone on 06/16/24 at 11 am   Please call the care guide team at [phone] if you need to cancel, schedule, or reschedule an appointment.   Please call the Suicide and Crisis Lifeline: 988 call the [place]  National Suicide Prevention Lifeline: (782)631-5034 or TTY: (743) 640-7409 TTY 727-769-1967) to talk to a trained counselor call 1-800-273-TALK (toll free, 24 hour hotline) if you are experiencing a Mental Health or Behavioral Health Crisis or need someone to talk to.  Heddy Shutter, RN, MSN, BSN, CCM [place]  [facility], Population Health Case Manager Phone: [phone]

## 2024-05-18 DIAGNOSIS — J9620 Acute and chronic respiratory failure, unspecified whether with hypoxia or hypercapnia: Secondary | ICD-10-CM | POA: Diagnosis not present

## 2024-05-18 DIAGNOSIS — J449 Chronic obstructive pulmonary disease, unspecified: Secondary | ICD-10-CM | POA: Diagnosis not present

## 2024-05-18 NOTE — Telephone Encounter (Signed)
Letter of results sent to pt  

## 2024-05-19 ENCOUNTER — Inpatient Hospital Stay

## 2024-05-19 ENCOUNTER — Telehealth: Payer: Self-pay

## 2024-05-19 ENCOUNTER — Inpatient Hospital Stay (HOSPITAL_BASED_OUTPATIENT_CLINIC_OR_DEPARTMENT_OTHER): Admitting: Hematology and Oncology

## 2024-05-19 VITALS — BP 123/47 | HR 77 | Temp 98.2°F | Resp 16 | Wt 103.9 lb

## 2024-05-19 DIAGNOSIS — E538 Deficiency of other specified B group vitamins: Secondary | ICD-10-CM

## 2024-05-19 DIAGNOSIS — D539 Nutritional anemia, unspecified: Secondary | ICD-10-CM | POA: Diagnosis not present

## 2024-05-19 DIAGNOSIS — D649 Anemia, unspecified: Secondary | ICD-10-CM | POA: Diagnosis not present

## 2024-05-19 DIAGNOSIS — R0609 Other forms of dyspnea: Secondary | ICD-10-CM

## 2024-05-19 LAB — CBC WITH DIFFERENTIAL (CANCER CENTER ONLY)
Abs Immature Granulocytes: 0.03 K/uL (ref 0.00–0.07)
Basophils Absolute: 0 K/uL (ref 0.0–0.1)
Basophils Relative: 0 %
Eosinophils Absolute: 0.1 K/uL (ref 0.0–0.5)
Eosinophils Relative: 2 %
HCT: 26.2 % — ABNORMAL LOW (ref 36.0–46.0)
Hemoglobin: 8.3 g/dL — ABNORMAL LOW (ref 12.0–15.0)
Immature Granulocytes: 1 %
Lymphocytes Relative: 12 %
Lymphs Abs: 0.7 K/uL (ref 0.7–4.0)
MCH: 36.2 pg — ABNORMAL HIGH (ref 26.0–34.0)
MCHC: 31.7 g/dL (ref 30.0–36.0)
MCV: 114.4 fL — ABNORMAL HIGH (ref 80.0–100.0)
Monocytes Absolute: 0.3 K/uL (ref 0.1–1.0)
Monocytes Relative: 5 %
Neutro Abs: 4.8 K/uL (ref 1.7–7.7)
Neutrophils Relative %: 80 %
Platelet Count: 176 K/uL (ref 150–400)
RBC: 2.29 MIL/uL — ABNORMAL LOW (ref 3.87–5.11)
RDW: 12.6 % (ref 11.5–15.5)
WBC Count: 6 K/uL (ref 4.0–10.5)
nRBC: 0 % (ref 0.0–0.2)

## 2024-05-19 LAB — CMP (CANCER CENTER ONLY)
ALT: 11 U/L (ref 0–44)
AST: 22 U/L (ref 15–41)
Albumin: 4.1 g/dL (ref 3.5–5.0)
Alkaline Phosphatase: 67 U/L (ref 38–126)
Anion gap: 6 (ref 5–15)
BUN: 52 mg/dL — ABNORMAL HIGH (ref 8–23)
CO2: 38 mmol/L — ABNORMAL HIGH (ref 22–32)
Calcium: 9.1 mg/dL (ref 8.9–10.3)
Chloride: 95 mmol/L — ABNORMAL LOW (ref 98–111)
Creatinine: 1.48 mg/dL — ABNORMAL HIGH (ref 0.44–1.00)
GFR, Estimated: 34 mL/min — ABNORMAL LOW (ref >60)
Glucose, Bld: 120 mg/dL — ABNORMAL HIGH (ref 70–99)
Potassium: 4.1 mmol/L (ref 3.5–5.1)
Sodium: 139 mmol/L (ref 135–145)
Total Bilirubin: 0.5 mg/dL (ref 0.0–1.2)
Total Protein: 7.1 g/dL (ref 6.5–8.1)

## 2024-05-19 LAB — VITAMIN B12: Vitamin B-12: 367 pg/mL (ref 180–914)

## 2024-05-19 MED ORDER — CYANOCOBALAMIN 1000 MCG/ML IJ SOLN
1000.0000 ug | Freq: Once | INTRAMUSCULAR | Status: AC
  Administered 2024-05-19: 1000 ug via INTRAMUSCULAR
  Filled 2024-05-19: qty 1

## 2024-05-19 MED ORDER — CYANOCOBALAMIN 1000 MCG/ML IJ SOLN: 1000.0000 ug | Freq: Once | INTRAMUSCULAR | Status: AC

## 2024-05-19 MED ORDER — EPOETIN ALFA-EPBX 10000 UNIT/ML IJ SOLN: 10000.0000 [IU] | Freq: Once | INTRAMUSCULAR | Status: DC

## 2024-05-19 NOTE — Telephone Encounter (Signed)
 Spoke with pharmacy they are going to resubmit a cover my meds and fax to us  for Dr. Annella to sign when he returns to clinic-  Tried to call patient no answer and no way to leave message   NFN-

## 2024-05-19 NOTE — Progress Notes (Signed)
 [facility] [phone]   [phone]  PROGRESS NOTE  Patient Care Team: Rollene Almarie LABOR, MD as PCP - General (Internal Medicine) Pietro Redell RAMAN, MD as PCP - Cardiology (Cardiology) Prentiss Heddy HERO, RN as [facility] Wallace, Juana M, RN  CHIEF COMPLAINTS/PURPOSE OF CONSULTATION:  Macrocytic anemia  Vitamin B12 deficiency  HISTORY OF PRESENTING ILLNESS:  On review of the previous records, there is evidence of chronic anemia for several years stable between 10-11. Most recent labs from 2/29/204 showed Hgb had declined to 9.8, MCV 100.3. No other cytopenias. Iron  panel shows no evidence of iron  deficiency.  She has received periodic iron  infusions, most recently received IV feraheme  510 mg x 2 doses in October 2023. She was last given 2 units of PRBC in December 2023 after being hospitalized for fracture of left femur which required intramedullary nailing with ortho. Lastly, she receives retracrit 10,000 units every 4 weeks, last given on 12/30/2022.   INTERVAL HISTORY: Paula Ward 88 y.o. female returns for a follow up for macrocytic anemia.  She is accompanied by her husband for this visit.  She was last seen on 02/24/2024 and in the interim, she was admitted to the hospital and started on BiPAP in the ICU.  She was discharged on hospice but due to her improvement she reports she is no longer on hospice.  On exam today, Paula Ward reports she reports that she is using about 4 to 5 L of oxygen  at home.  She reports she is not having any runny nose, sore throat, or cough.  Overall she is feeling improved from her hospitalization but is having some issues of feeling swimmy headed.  She notes that she likely has a low hemoglobin today.  She reports that she has not been receiving her Retacrit  shot.  She has had no overt signs of bleeding, bruising, or dark stools.  She reports her appetite is fair.  Otherwise she is at her baseline level of  health with no changes since our last discussion.  MEDICAL HISTORY:  Past Medical History:  Diagnosis Date   Anemia    Angiodysplasia of stomach and duodenum    Aortic stenosis    Arthus phenomenon    AVM (arteriovenous malformation)    Blood transfusion without reported diagnosis    Breast cancer (HCC) 1989   Left   Candida esophagitis (HCC)    Cataract    Chronic respiratory failure (HCC)    CKD stage 3b, GFR 30-44 ml/min (HCC)    Corneal edema    Corneal epithelial basement membrane dystrophy    CREST syndrome (HCC)    GERD (gastroesophageal reflux disease)    w/ HH   Interstitial lung disease (HCC)    Nodule of kidney    Pericardial effusion    PONV (postoperative nausea and vomiting)    Pulmonary embolus (HCC) 2003   Pulmonary hypertension (HCC)    Rectal incontinence    Renal cell carcinoma (HCC)    Scleroderma (HCC)    Tubular adenoma of colon    Uterine polyp     SURGICAL HISTORY: Past Surgical History:  Procedure Laterality Date   APPENDECTOMY  1962   BREAST LUMPECTOMY  1989   left   CATARACT EXTRACTION, BILATERAL Bilateral 12/2013   COLONOSCOPY WITH PROPOFOL  N/A 04/15/2021   Procedure: COLONOSCOPY WITH PROPOFOL ;  Surgeon: Teressa Toribio SQUIBB, MD;  Location: [facility];  Service: Endoscopy;  Laterality: N/A;   ENTEROSCOPY N/A 01/18/2016  Procedure: ENTEROSCOPY;  Surgeon: Gustav Shila GAILS, MD;  Location: [facility];  Service: Endoscopy;  Laterality: N/A;   ENTEROSCOPY N/A 05/24/2018   Procedure: ENTEROSCOPY;  Surgeon: Charlanne Groom, MD;  [facility];  Service: Endoscopy;  Laterality: N/A;   ENTEROSCOPY N/A 03/29/2021   Procedure: ENTEROSCOPY;  Surgeon: Albertus Gordy HERO, MD;  [facility]: [facility];  Service: Gastroenterology;  Laterality: N/A;   ENTEROSCOPY N/A 04/13/2021   Procedure: ENTEROSCOPY;  Surgeon: Leigh Elspeth SQUIBB, MD;  [facility];  Service: Gastroenterology;  Laterality: N/A;   ESOPHAGOGASTRODUODENOSCOPY (EGD) WITH PROPOFOL   N/A 12/21/2015   Procedure: ESOPHAGOGASTRODUODENOSCOPY (EGD) WITH PROPOFOL ;  Surgeon: Norleen LOISE Kiang, MD;  [facility];  Service: Endoscopy;  Laterality: N/A;   HOT HEMOSTASIS N/A 05/24/2018   Procedure: HOT HEMOSTASIS (ARGON PLASMA COAGULATION/BICAP);  Surgeon: Charlanne Groom, MD;  Location: [facility];  Service: Endoscopy;  Laterality: N/A;   HOT HEMOSTASIS N/A 03/29/2021   Procedure: HOT HEMOSTASIS (ARGON PLASMA COAGULATION/BICAP);  Surgeon: Albertus Gordy HERO, MD;  Location: [facility];  Service: Gastroenterology;  Laterality: N/A;   HOT HEMOSTASIS N/A 04/13/2021   Procedure: HOT HEMOSTASIS (ARGON PLASMA COAGULATION/BICAP);  Surgeon: Leigh Elspeth SQUIBB, MD;  Location: [facility];  Service: Gastroenterology;  Laterality: N/A;   INTRAMEDULLARY (IM) NAIL INTERTROCHANTERIC Left 10/11/2022   Procedure: INTRAMEDULLARY (IM) NAIL INTERTROCHANTERIC;  Surgeon: Melodi Lerner, MD;  [facility];  Service: Orthopedics;  Laterality: Left;   IR GENERIC HISTORICAL  06/05/2016   IR RADIOLOGIST EVAL & MGMT 06/05/2016 Marcey Moan, MD [facility] INTERV RAD   IVC Filter     KIDNEY SURGERY     left -laser surgery by Dr. moan- 5 yrs ago no removal   RIGHT/LEFT HEART CATH AND CORONARY ANGIOGRAPHY N/A 04/25/2022   Procedure: RIGHT/LEFT HEART CATH AND CORONARY ANGIOGRAPHY;  Surgeon: Wonda Sharper, MD;  Location: [facility];  Service: Cardiovascular;  Laterality: N/A;   SCHLEROTHERAPY  05/24/2018   Procedure: WALDEMAR;  Surgeon: Charlanne Groom, MD;  [facility];  Service: Endoscopy;;   SUBMUCOSAL TATTOO INJECTION  04/13/2021   Procedure: SUBMUCOSAL TATTOO INJECTION;  Surgeon: Leigh Elspeth SQUIBB, MD;  [facility];  Service: Gastroenterology;;   TONSILLECTOMY     TUBAL LIGATION      SOCIAL HISTORY: Social History   Socioeconomic History   Marital status: Married    Spouse name: Not on file   Number of children: 2   Years of education: Not on file   Highest  education level: Not on file  Occupational History   Occupation: Retired  Tobacco Use   Smoking status: Never   Smokeless tobacco: Never  Vaping Use   Vaping status: Never Used  Substance and Sexual Activity   Alcohol use: No   Drug use: No   Sexual activity: Not Currently  Other Topics Concern   Not on file  Social History Narrative   Married '611 son- '65, 1 daughter '63; 6 children (2 adopted)SO- SOBRetirement- doing well. Marriage in good health. Patient has never smoked. Alcohol use- noPt gets regular exercise   Social Drivers of Health   Financial Resource Strain: Low Risk  (04/02/2022)   Overall Financial Resource Strain (CARDIA)    Difficulty of Paying Living Expenses: Not hard at all  Food Insecurity: No Food Insecurity (03/24/2024)   Hunger Vital Sign    Worried About Running Out of Food in the Last Year: Never true    Ran Out of Food in the Last Year: Never true  Transportation Needs:  No Transportation Needs (03/24/2024)   PRAPARE - Administrator, Civil Service (Medical): No    Lack of Transportation (Non-Medical): No  Physical Activity: Inactive (04/02/2022)   Exercise Vital Sign    Days of Exercise per Week: 0 days    Minutes of Exercise per Session: 0 min  Stress: No Stress Concern Present (04/02/2022)   [employer] of Occupational Health - Occupational Stress Questionnaire    Feeling of Stress : Not at all  Social Connections: Socially Integrated (03/24/2024)   Social Connection and Isolation Panel    Frequency of Communication with Friends and Family: More than three times a week    Frequency of Social Gatherings with Friends and Family: More than three times a week    Attends Religious Services: More than 4 times per year    Active Member of [employer] or Organizations: Yes    Attends Engineer, structural: More than 4 times per year    Marital Status: Married  Catering manager Violence: Not At Risk (03/24/2024)   Humiliation, Afraid, Rape, and  Kick questionnaire    Fear of Current or Ex-Partner: No    Emotionally Abused: No    Physically Abused: No    Sexually Abused: No    FAMILY HISTORY: Family History  Problem Relation Age of Onset   Bladder Cancer Father    Diabetes Father    Prostate cancer Father    Alzheimer's disease Mother    Diabetes Sister    Lung cancer Sister         smoker   Esophageal cancer Paternal Uncle    Colon cancer Neg Hx     ALLERGIES:  is allergic to codeine , other, erythromycin, iodinated contrast media, lisinopril, mirtazapine , mycophenolate mofetil, and sulfa  antibiotics.  MEDICATIONS:  Current Outpatient Medications  Medication Sig Dispense Refill   acetaminophen  (TYLENOL ) 325 MG tablet Take 2 tablets (650 mg total) by mouth every 6 (six) hours as needed for moderate pain. 30 tablet 0   albuterol  (PROVENTIL ) (2.5 MG/3ML) 0.083% nebulizer solution Take 3 mLs (2.5 mg total) by nebulization every 6 (six) hours as needed for wheezing or shortness of breath. 75 mL 12   albuterol  (VENTOLIN  HFA) 108 (90 Base) MCG/ACT inhaler INHALE 2 PUFFS INTO THE LUNGS EVERY 6 HOURS AS NEEDED FOR WHEEZING OR SHORTNESS OF BREATH 6.7 g 3   arformoterol  (BROVANA ) 15 MCG/2ML NEBU Take 2 mLs (15 mcg total) by nebulization 2 (two) times daily. 120 mL 6   augmented betamethasone dipropionate (DIPROLENE-AF) 0.05 % cream Apply 1 application  topically 2 (two) times daily as needed (for irritation). (Patient not taking: Reported on 05/17/2024)     clobetasol ointment (TEMOVATE) 0.05 % Apply 1 Application topically 2 (two) times daily as needed (for irritation). (Patient not taking: Reported on 05/17/2024)     diltiazem  (CARDIZEM ) 30 MG tablet Take 1 tablet (30 mg total) by mouth every 12 (twelve) hours. 60 tablet 11   docusate sodium  (COLACE) 100 MG capsule Take 100 mg by mouth daily.     empagliflozin  (JARDIANCE ) 10 MG TABS tablet Take 1 tablet (10 mg total) by mouth daily. (Patient not taking: Reported on 05/17/2024) 30 tablet  3   famotidine  (PEPCID ) 20 MG tablet TAKE 1 TABLET BY MOUTH AT BEDTIME 30 tablet 0   fluticasone  (FLONASE ) 50 MCG/ACT nasal spray USE 1 SPRAY(S) IN EACH NOSTRIL ONCE DAILY AS NEEDED FOR ALLERGIES 16 g 0   furosemide  (LASIX ) 20 MG tablet Take 2 tablets (  40 mg ) every morning may take extra 2 tablets for weight gain of 3 lbs 360 tablet 3   hydrOXYzine  (ATARAX ) 10 MG tablet Take 1 tablet (10 mg total) by mouth 3 (three) times daily as needed for anxiety. 30 tablet 0   latanoprost  (XALATAN ) 0.005 % ophthalmic solution Place 1 drop into both eyes at bedtime.     magic mouthwash SOLN Take 10 mLs by mouth 4 (four) times daily as needed for mouth pain. 15 mL 2   Multiple Vitamin (MULTIVITAMIN WITH MINERALS) TABS tablet Take 1 tablet by mouth daily. (Patient not taking: Reported on 05/17/2024)     Nutritional Supplements (ENSURE HIGH PROTEIN) LIQD Take 0.5 Bottles by mouth daily as needed (for supplementation).     oxyCODONE  (ROXICODONE ) 5 MG/5ML solution Take 0.25 mLs (0.25 mg total) by mouth every 3 (three) hours as needed for moderate pain (pain score 4-6) or severe pain (pain score 7-10) (shortness of breath). (Patient not taking: Reported on 05/17/2024) 15 mL 0   OXYGEN  Inhale 6 L/min into the lungs continuous. (Patient taking differently: Inhale 6 L/min into the lungs continuous.)     pantoprazole  (PROTONIX ) 40 MG tablet TAKE 1 TABLET BY MOUTH TWICE DAILY BEFORE A MEAL 180 tablet 0   revefenacin  (YUPELRI ) 175 MCG/3ML nebulizer solution Take 3 mLs (175 mcg total) by nebulization daily. 90 mL 11   spironolactone  (ALDACTONE ) 25 MG tablet Take 1/2 tablet ( 12.5 mg ) every morning     sucralfate  (CARAFATE ) 1 GM/10ML suspension TAKE 10 MLS BY MOUTH EVERY 6 HOURS AS NEEDED FOR REFLUX 420 mL 0   triamcinolone  cream (KENALOG ) 0.1 % Apply 1 Application topically See admin instructions. Apply to affected leg(s) after showers and up to 2 additional times a day as needed for irritation     No current  facility-administered medications for this visit.   Facility-Administered Medications Ordered in Other Visits  Medication Dose Route Frequency Provider Last Rate Last Admin   cyanocobalamin  (VITAMIN B12) injection 1,000 mcg  1,000 mcg Intramuscular Once Thayil, Irene T, PA-C        REVIEW OF SYSTEMS:   Constitutional: ( - ) fevers, ( - )  chills , ( - ) night sweats Eyes: ( - ) blurriness of vision, ( - ) double vision, ( - ) watery eyes Ears, nose, mouth, throat, and face: ( - ) mucositis, ( - ) sore throat Respiratory: ( - ) cough, ( + ) dyspnea, ( - ) wheezes Cardiovascular: ( - ) palpitation, ( - ) chest discomfort, ( - ) lower extremity swelling Gastrointestinal:  ( - ) nausea, ( - ) heartburn, ( - ) change in bowel habits Skin: ( - ) abnormal skin rashes Lymphatics: ( - ) new lymphadenopathy, ( - ) easy bruising Neurological: ( - ) numbness, ( - ) tingling, ( - ) new weaknesses Behavioral/Psych: ( - ) mood change, ( - ) new changes  All other systems were reviewed with the patient and are negative.  PHYSICAL EXAMINATION: ECOG PERFORMANCE STATUS: 1 - Symptomatic but completely ambulatory  Vitals:   05/19/24 1546  BP: (!) 123/47  Pulse: 77  Resp: 16  Temp: 98.2 F (36.8 C)  SpO2: 100%     Filed Weights   05/19/24 1546  Weight: 103 lb 14.4 oz (47.1 kg)      GENERAL: well appearing elderly female in NAD  SKIN: skin color, texture, turgor are normal, no rashes or significant lesions EYES: conjunctiva are pink and  non-injected, sclera clear LUNGS: clear to auscultation and percussion with normal breathing effort HEART: regular rate & rhythm and no murmurs and no lower extremity edema Musculoskeletal: no cyanosis of digits and no clubbing  PSYCH: alert & oriented x 3, fluent speech NEURO: no focal motor/sensory deficits  LABORATORY DATA:  I have reviewed the data as listed    Latest Ref Rng & Units 05/19/2024    3:00 PM 04/19/2024    2:53 PM 03/24/2024    3:11 AM   CBC  WBC 4.0 - 10.5 K/uL 6.0  4.7  6.4   Hemoglobin 12.0 - 15.0 g/dL 8.3  8.9 Repeated and verified X2.  8.9   Hematocrit 36.0 - 46.0 % 26.2  26.7 Repeated and verified X2.  29.2   Platelets 150 - 400 K/uL 176  152.0  121        Latest Ref Rng & Units 05/19/2024    3:00 PM 05/13/2024   11:37 AM 05/03/2024   11:35 AM  CMP  Glucose 70 - 99 mg/dL 879  89  76   BUN 8 - 23 mg/dL 52  48  57   Creatinine 0.44 - 1.00 mg/dL 8.51  8.47  8.61   Sodium 135 - 145 mmol/L 139  139  137   Potassium 3.5 - 5.1 mmol/L 4.1  4.6  4.4   Chloride 98 - 111 mmol/L 95  95  94   CO2 22 - 32 mmol/L 38  29  36   Calcium  8.9 - 10.3 mg/dL 9.1  9.2  9.4   Total Protein 6.5 - 8.1 g/dL 7.1     Total Bilirubin 0.0 - 1.2 mg/dL 0.5     Alkaline Phos 38 - 126 U/L 67     AST 15 - 41 U/L 22     ALT 0 - 44 U/L 11      RADIOGRAPHIC STUDIES: No results found.   ASSESSMENT & PLAN Paula Ward is a 88 y.o. female who presents to the hematology clinic for evaluation of macrocytic anemia  #Macrocytic anemia: #Vitamin B12 deficiency: #Anemia of chronic disease --Suspect multifactorial including vitamin b12 deficinecy, CKD and chronic disease.  --Iron  panel from 12/30/2022 reviewed without deficiency.  --Patient had retacrit  10,000 units q 4 weeks, last received 12/30/2022. Will administer again today.  --Workup from 02/12/2023 didn't reveal hemolysis, paraproteinemia, folate deficiency. There was evidence of vitamin B12 deficiency so patient is currently on vitamin B12 SL replacement every other day.   --bone marrow biopsy on 08/14/2023 showed no clear cause for the macrocytosis, though excessive iron  deposition was noted. HFE genetic testing was negative in November 2024 -- no evidence of cirrhosis on prior imaging from 2023 (Chest imaging, but liver visualized). --Labs from today show white blood cell 6.0, hemoglobin 8.3, MCV 114.4, platelets 176 --recommend to continue with monthly B12 injection.  Patient received  vitamin B12 today.  Attempting to administer Retacrit  but running into prior Auth issues.  Will have patient return next week if unable to administer today. --RTC q 4 weeks for labs/injection with clinic visit   No orders of the defined types were placed in this encounter.   All questions were answered. The patient knows to call the clinic with any problems, questions or concerns.  I have spent a total of 30 minutes minutes of face-to-face and non-face-to-face time, preparing to see the patient, obtaining and/or reviewing separately obtained history, performing a medically appropriate examination, counseling and educating the patient, ordering tests/procedures,documenting clinical  information in the electronic health record, and care coordination.   Norleen IVAR Kidney, MD Department of Hematology/Oncology [facility] Phone: [phone] Pager: [phone] Email: [email or web address]

## 2024-05-19 NOTE — Telephone Encounter (Signed)
 Copied from CRM [id number]. Topic: Clinical - Prescription Issue >> May 18, 2024  4:02 PM Leila C wrote: Reason for CRM: Patient [phone] states the [facility] pharmacy left a message that they're holding a medication since 05/03/24 needing an authorization from Dr. Annella, and will keep medication until this Friday. Patient does not know the name of medication, only that it's a  solution for nebulizer. Please advise and call back.  [facility] [address] [place], [place] - [address] [address] Phone:[phone]:[phone]

## 2024-05-20 ENCOUNTER — Telehealth: Payer: Self-pay | Admitting: Hematology and Oncology

## 2024-05-20 DIAGNOSIS — H401432 Capsular glaucoma with pseudoexfoliation of lens, bilateral, moderate stage: Secondary | ICD-10-CM | POA: Diagnosis not present

## 2024-05-20 LAB — FERRITIN: Ferritin: 1225 ng/mL — ABNORMAL HIGH (ref 11–307)

## 2024-05-20 NOTE — Telephone Encounter (Signed)
 If this is for the Yupelri  solution- this has already been denied by the patients Part D. Nebulizer solutions may also be billed under Part B but I know that [employer] has a different system for this.   Nebulizer solutions typically only covered for COPD. Office may need to do a CMN form through Medicare to get this covered.

## 2024-05-20 NOTE — Progress Notes (Signed)
 Pt received B12  IM injection after her clinic visit with Dr. Federico. She did not receive Retacrit  as a PA  was not available at the time. Pt aware and agreeable to come back next week for her Retacrit  injection. Scheduling message sent

## 2024-05-21 LAB — METHYLMALONIC ACID, SERUM: Methylmalonic Acid, Quantitative: 814 nmol/L — ABNORMAL HIGH (ref 0–378)

## 2024-05-25 ENCOUNTER — Telehealth: Payer: Self-pay | Admitting: *Deleted

## 2024-05-25 NOTE — Telephone Encounter (Signed)
 I called and spoke with patient and explained what the pharmacy had explained in their note about the Yupelri  being denied and that we may need to complete a CMN for this medication.  She said [facility] told her the medication would be $200.  She said it really helps and works well for her and would like to continue using it.  It is likely that [facility] will need to send us  a CMN to complete.  Will await response from pharmacy team.

## 2024-05-25 NOTE — Telephone Encounter (Signed)
 Turn around time for paper work is 7 days but most likely it will be completed Friday I have placed inside providers office box

## 2024-05-25 NOTE — Telephone Encounter (Signed)
 Copied from CRM #[id number]. Topic: General - Other >> May 25, 2024 11:52 AM Martinique E wrote: Reason for CRM: Patient called to see if a letter was ready for pick up. When agent asked what type of letter this was, patient stated a letter regarding my health and a vehicle. Please call patient and advise if ready for pick up, as she needs this by Friday 8/8.  [phone].

## 2024-05-25 NOTE — Telephone Encounter (Signed)
 Copied from CRM #[id number]. Topic: Clinical - Order For Equipment >> May 25, 2024 11:46 AM Celestine FALCON wrote: Reason for CRM: Pt is calling to speak to Sonny, pt's phone number is [phone].  Pt stated she has been trying to get a medication called Yupelri , and would like to speak to [name] about this medication.   The last note was from an encounter on 7/28 which CMA Sonny stated I called and spoke with the pt  She states that she spoke with [facility] and they are trying to get her Yupelri  approved  They need us  to send rx to them for pricing  Sometimes DME can get it cheaper I went ahead and sent rx to them and included the dx code  I told her not sure what the cost will be, but we can try since it's helping her so much  She still has some samples from the last visit  Routing to [facility] as FYI.  Please call the pt back to discuss ok to leave a vm.   If this is for the Yupelri  solution- this has already been denied by the patients Part D. Nebulizer solutions may also be billed under Part B but I know that [employer] has a different system for this.    Nebulizer solutions typically only covered for COPD. Office may need to do a CMN form through Medicare to get this covered.     I sent a message to the pharmacy to see where the CMN form can be found.  This is likely a form that [employer] has to fax to our office.  Will await response from pharmacy  [facility] said it would be $200.

## 2024-05-25 NOTE — Telephone Encounter (Signed)
 Where do we get the CMN form?

## 2024-05-26 ENCOUNTER — Inpatient Hospital Stay: Attending: Physician Assistant

## 2024-05-26 ENCOUNTER — Other Ambulatory Visit: Payer: Self-pay | Admitting: Hematology and Oncology

## 2024-05-26 VITALS — BP 114/50 | HR 71

## 2024-05-26 DIAGNOSIS — N1832 Chronic kidney disease, stage 3b: Secondary | ICD-10-CM | POA: Diagnosis not present

## 2024-05-26 DIAGNOSIS — E538 Deficiency of other specified B group vitamins: Secondary | ICD-10-CM

## 2024-05-26 DIAGNOSIS — D631 Anemia in chronic kidney disease: Secondary | ICD-10-CM | POA: Insufficient documentation

## 2024-05-26 MED ORDER — EPOETIN ALFA-EPBX 10000 UNIT/ML IJ SOLN
10000.0000 [IU] | Freq: Once | INTRAMUSCULAR | Status: AC
  Administered 2024-05-26: 10000 [IU] via SUBCUTANEOUS
  Filled 2024-05-26: qty 1

## 2024-05-27 ENCOUNTER — Telehealth: Payer: Self-pay

## 2024-05-27 NOTE — Telephone Encounter (Signed)
 Called and was unable to leave voicemail due to mailbox being full.  Patient Forms are ready for pick-up.

## 2024-06-01 ENCOUNTER — Telehealth: Payer: Self-pay

## 2024-06-01 DIAGNOSIS — I509 Heart failure, unspecified: Secondary | ICD-10-CM | POA: Diagnosis not present

## 2024-06-01 NOTE — Telephone Encounter (Signed)
 Called patient to clarify if she has picked up forms from our office and she verbalized she did on 05/27/2024

## 2024-06-01 NOTE — Telephone Encounter (Signed)
 Copied from CRM #[id number]. Topic: Clinical - Prescription Issue >> May 31, 2024  2:21 PM Corean SAUNDERS wrote: Reason for CRM: Patient is inquiring on if there has been any approval for the Yupelri  medication as this helps her very much and if not, patient states she will need some other medication. Please call patient back and advise.  Per other encounter from 04-29-24, RX team states Yupelri  is not covered.  You asked for the drug above for your chronic respiratory failure with hypercapnia and hypoxia. This is an off-label use that is not medically accepted. The Medicare rule in the Prescription Drug Benefit Manual (Chapter 6, Section 10.6) says a drug must be used for a medically accepted indication (covered use). Off-label use is medically accepted when there is proof in one or more of the drug guides that the drug works for your condition. We look at the two major drug guides (compendia): the DRUGDEX Information System and the [facility] Formulary Service Drug Information (AHFS-DI). [employer] has decided that there is no proof in either drug guide that this drug works for your condition. Per Medicare rules, it is not covered.   Sarah, please advise if another rx is acceptable.

## 2024-06-01 NOTE — Telephone Encounter (Signed)
 Closing encounter as her forms have been placed upfront to be picked up

## 2024-06-02 ENCOUNTER — Other Ambulatory Visit: Payer: Self-pay | Admitting: Hematology and Oncology

## 2024-06-02 ENCOUNTER — Inpatient Hospital Stay

## 2024-06-02 DIAGNOSIS — E538 Deficiency of other specified B group vitamins: Secondary | ICD-10-CM

## 2024-06-02 DIAGNOSIS — N1832 Chronic kidney disease, stage 3b: Secondary | ICD-10-CM | POA: Diagnosis not present

## 2024-06-02 DIAGNOSIS — D631 Anemia in chronic kidney disease: Secondary | ICD-10-CM | POA: Diagnosis not present

## 2024-06-02 LAB — CBC WITH DIFFERENTIAL (CANCER CENTER ONLY)
Abs Immature Granulocytes: 0.02 K/uL (ref 0.00–0.07)
Basophils Absolute: 0 K/uL (ref 0.0–0.1)
Basophils Relative: 1 %
Eosinophils Absolute: 0.1 K/uL (ref 0.0–0.5)
Eosinophils Relative: 2 %
HCT: 26.7 % — ABNORMAL LOW (ref 36.0–46.0)
Hemoglobin: 8.5 g/dL — ABNORMAL LOW (ref 12.0–15.0)
Immature Granulocytes: 0 %
Lymphocytes Relative: 12 %
Lymphs Abs: 0.7 K/uL (ref 0.7–4.0)
MCH: 36.5 pg — ABNORMAL HIGH (ref 26.0–34.0)
MCHC: 31.8 g/dL (ref 30.0–36.0)
MCV: 114.6 fL — ABNORMAL HIGH (ref 80.0–100.0)
Monocytes Absolute: 0.4 K/uL (ref 0.1–1.0)
Monocytes Relative: 7 %
Neutro Abs: 4.6 K/uL (ref 1.7–7.7)
Neutrophils Relative %: 78 %
Platelet Count: 179 K/uL (ref 150–400)
RBC: 2.33 MIL/uL — ABNORMAL LOW (ref 3.87–5.11)
RDW: 13.2 % (ref 11.5–15.5)
WBC Count: 5.8 K/uL (ref 4.0–10.5)
nRBC: 0 % (ref 0.0–0.2)

## 2024-06-02 LAB — CMP (CANCER CENTER ONLY)
ALT: 11 U/L (ref 0–44)
AST: 22 U/L (ref 15–41)
Albumin: 4.3 g/dL (ref 3.5–5.0)
Alkaline Phosphatase: 71 U/L (ref 38–126)
Anion gap: 6 (ref 5–15)
BUN: 47 mg/dL — ABNORMAL HIGH (ref 8–23)
CO2: 38 mmol/L — ABNORMAL HIGH (ref 22–32)
Calcium: 9.3 mg/dL (ref 8.9–10.3)
Chloride: 95 mmol/L — ABNORMAL LOW (ref 98–111)
Creatinine: 1.51 mg/dL — ABNORMAL HIGH (ref 0.44–1.00)
GFR, Estimated: 34 mL/min — ABNORMAL LOW (ref >60)
Glucose, Bld: 89 mg/dL (ref 70–99)
Potassium: 4.4 mmol/L (ref 3.5–5.1)
Sodium: 139 mmol/L (ref 135–145)
Total Bilirubin: 0.6 mg/dL (ref 0.0–1.2)
Total Protein: 6.9 g/dL (ref 6.5–8.1)

## 2024-06-02 LAB — VITAMIN B12: Vitamin B-12: 675 pg/mL (ref 180–914)

## 2024-06-02 NOTE — Telephone Encounter (Signed)
 Dr Annella will have to advise when he is back next week.

## 2024-06-03 ENCOUNTER — Other Ambulatory Visit: Payer: Self-pay | Admitting: Gastroenterology

## 2024-06-06 ENCOUNTER — Other Ambulatory Visit: Payer: Self-pay

## 2024-06-06 ENCOUNTER — Telehealth: Payer: Self-pay

## 2024-06-06 MED ORDER — IPRATROPIUM BROMIDE 0.02 % IN SOLN: 0.5000 mg | Freq: Two times a day (BID) | RESPIRATORY_TRACT | 3 refills | Status: DC

## 2024-06-06 NOTE — Telephone Encounter (Signed)
 We could use ipratropium nebulized twice daily. If ok with this please prescribe, diagnosis code asthma. Thanks!

## 2024-06-06 NOTE — Telephone Encounter (Signed)
 Copied from CRM #[id number]. Topic: Clinical - Prescription Issue >> Jun 06, 2024 11:09 AM Celestine FALCON wrote: Reason for CRM: Pt stated she missed a call, it was from [facility] regarding the Ipratropium nebulized medication twice daily. Sheila said she understood and is on board with this medication.  Pt prefers pharmacy [facility] [address] [place], [place] - [address]  Pt stated she is already taking albuterol  (PROVENTIL ) (2.5 MG/3ML) 0.083% nebulizer solution, and wanted to confirm if she is to start nebulizing both medications daily and how often.  Please call the pt back at [phone] ok to leave a vm.

## 2024-06-06 NOTE — Telephone Encounter (Signed)
 Called the pt and there was no answer, unable to leave msg since her vm was full, will call back.

## 2024-06-06 NOTE — Telephone Encounter (Signed)
 Lets do ipratropium nebulized BID, she can use albuterol  nebulized 4 times daily AS NEEDED in addition without problem but would avoid scheduled use of albuterol .

## 2024-06-06 NOTE — Telephone Encounter (Signed)
 Copied from CRM #[id number]. Topic: Clinical - Prescription Issue >> Jun 06, 2024 11:09 AM Celestine FALCON wrote: Reason for CRM: Pt stated she missed a call, it was from [facility] regarding the Ipratropium nebulized medication twice daily. Mack[place]ey said she understood and is on board with this medication.  Pt prefers pharmacy [facility] [address] [place], [place] - [address]  Pt stated she is already taking albuterol  (PROVENTIL ) (2.5 MG/3ML) 0.083% nebulizer solution, and wanted to confirm if she is to start nebulizing both medications daily and how often.  Please call the pt back at [phone] ok to leave a vm.  Spoke with the patient regarding prior message. Patient was ok with me putting in a order for ipratropium nebulizer 2 times a day . Patient wanted to know if she should still use her Proventil  and how often.Dr.Hunsucker can you please advise .  Thank you

## 2024-06-06 NOTE — Telephone Encounter (Signed)
 Spoke with patient regarding prior message.Per Dr.Hunsucker Lets do ipratropium nebulized BID, she can use albuterol  nebulized 4 times daily AS NEEDED in addition without problem but would avoid scheduled use of albuterol .     Patient's voice was understanding.Nothing else further needed.

## 2024-06-09 DIAGNOSIS — N1832 Chronic kidney disease, stage 3b: Secondary | ICD-10-CM | POA: Diagnosis not present

## 2024-06-09 DIAGNOSIS — N189 Chronic kidney disease, unspecified: Secondary | ICD-10-CM | POA: Diagnosis not present

## 2024-06-09 NOTE — Telephone Encounter (Signed)
 Per Dr. Leatha last OV note, he is aware that the Yupelri  has been denied and insurance may not cover it.  Nothing further needed.

## 2024-06-10 NOTE — Telephone Encounter (Signed)
 Please refer to 06/06/2024 phone note

## 2024-06-12 NOTE — Progress Notes (Unsigned)
 Cardiology Clinic Note   Patient Name: Paula Ward Date of Encounter: 06/14/2024  Primary Care Provider:  Rollene Almarie LABOR, MD Primary Cardiologist:  Redell Shallow, MD  Patient Profile    Paula Ward 88 year old female presents the clinic today for follow-up evaluation of her lower extremity edema/chronic diastolic CHF.  Past Medical History    Past Medical History:  Diagnosis Date   Anemia    Angiodysplasia of stomach and duodenum    Aortic stenosis    Arthus phenomenon    AVM (arteriovenous malformation)    Blood transfusion without reported diagnosis    Breast cancer (HCC) 1989   Left   Candida esophagitis (HCC)    Cataract    Chronic respiratory failure (HCC)    CKD stage 3b, GFR 30-44 ml/min (HCC)    Corneal edema    Corneal epithelial basement membrane dystrophy    CREST syndrome (HCC)    GERD (gastroesophageal reflux disease)    w/ HH   Interstitial lung disease (HCC)    Nodule of kidney    Pericardial effusion    PONV (postoperative nausea and vomiting)    Pulmonary embolus (HCC) 2003   Pulmonary hypertension (HCC)    Rectal incontinence    Renal cell carcinoma (HCC)    Scleroderma (HCC)    Tubular adenoma of colon    Uterine polyp    Past Surgical History:  Procedure Laterality Date   APPENDECTOMY  1962   BREAST LUMPECTOMY  1989   left   CATARACT EXTRACTION, BILATERAL Bilateral 12/2013   COLONOSCOPY WITH PROPOFOL  N/A 04/15/2021   Procedure: COLONOSCOPY WITH PROPOFOL ;  Surgeon: Teressa Toribio SQUIBB, MD;  Location: [facility];  Service: Endoscopy;  Laterality: N/A;   ENTEROSCOPY N/A 01/18/2016   Procedure: ENTEROSCOPY;  Surgeon: Gustav Shila GAILS, MD;  Location: [facility];  Service: Endoscopy;  Laterality: N/A;   ENTEROSCOPY N/A 05/24/2018   Procedure: ENTEROSCOPY;  Surgeon: Charlanne Groom, MD;  Location: [facility];  Service: Endoscopy;  Laterality: N/A;   ENTEROSCOPY N/A 03/29/2021   Procedure: ENTEROSCOPY;  Surgeon: Albertus Gordy HERO,  MD;  Location: [facility];  Service: Gastroenterology;  Laterality: N/A;   ENTEROSCOPY N/A 04/13/2021   Procedure: ENTEROSCOPY;  Surgeon: Leigh Elspeth SQUIBB, MD;  Location: [facility];  Service: Gastroenterology;  Laterality: N/A;   ESOPHAGOGASTRODUODENOSCOPY (EGD) WITH PROPOFOL  N/A 12/21/2015   Procedure: ESOPHAGOGASTRODUODENOSCOPY (EGD) WITH PROPOFOL ;  Surgeon: Norleen LOISE Kiang, MD;  Location: [facility];  Service: Endoscopy;  Laterality: N/A;   HOT HEMOSTASIS N/A 05/24/2018   Procedure: HOT HEMOSTASIS (ARGON PLASMA COAGULATION/BICAP);  Surgeon: Charlanne Groom, MD;  Location: [facility];  Service: Endoscopy;  Laterality: N/A;   HOT HEMOSTASIS N/A 03/29/2021   Procedure: HOT HEMOSTASIS (ARGON PLASMA COAGULATION/BICAP);  Surgeon: Albertus Gordy HERO, MD;  Location: [facility];  Service: Gastroenterology;  Laterality: N/A;   HOT HEMOSTASIS N/A 04/13/2021   Procedure: HOT HEMOSTASIS (ARGON PLASMA COAGULATION/BICAP);  Surgeon: Leigh Elspeth SQUIBB, MD;  Location: [facility];  Service: Gastroenterology;  Laterality: N/A;   INTRAMEDULLARY (IM) NAIL INTERTROCHANTERIC Left 10/11/2022   Procedure: INTRAMEDULLARY (IM) NAIL INTERTROCHANTERIC;  Surgeon: Melodi Lerner, MD;  Location: [facility];  Service: Orthopedics;  Laterality: Left;   IR GENERIC HISTORICAL  06/05/2016   IR RADIOLOGIST EVAL & MGMT 06/05/2016 Marcey Moan, MD [facility] INTERV RAD   IVC Filter     KIDNEY SURGERY     left -laser surgery by Dr. moan- 5 yrs ago no removal   RIGHT/LEFT HEART CATH AND CORONARY  ANGIOGRAPHY N/A 04/25/2022   Procedure: RIGHT/LEFT HEART CATH AND CORONARY ANGIOGRAPHY;  Surgeon: Wonda Sharper, MD;  Location: [facility];  Service: Cardiovascular;  Laterality: N/A;   SCHLEROTHERAPY  05/24/2018   Procedure: WALDEMAR;  Surgeon: Charlanne Groom, MD;  Location: [facility];  Service: Endoscopy;;   SUBMUCOSAL TATTOO INJECTION  04/13/2021   Procedure: SUBMUCOSAL TATTOO INJECTION;  Surgeon: Leigh Elspeth SQUIBB, MD;   Location: [facility];  Service: Gastroenterology;;   TONSILLECTOMY     TUBAL LIGATION      Allergies  Allergies  Allergen Reactions   Codeine  Nausea Only    Hallucinations, too   Other Nausea And Vomiting and Other (See Comments)    -mycin antibiotics.   Also cause hallucinations.   Erythromycin Nausea And Vomiting   Iodinated Contrast Media Other (See Comments)    Renal issues   Lisinopril Other (See Comments)    Pt doesn't remember reaction   Mirtazapine  Other (See Comments)    Threw me into orbit   Mycophenolate Mofetil Nausea Only    Made me nervous   Sulfa  Antibiotics Other (See Comments)    Unknown reaction    History of Present Illness    Paula Ward 88 year old female has a PMH of essential hypertension, pulmonary hypertension, pericardial effusion, chronic diastolic CHF, AVM, moderate aortic stenosis, orthostatic hypotension, interstitial lung disease, GERD, CKD stage III, PE, incontinence, scleroderma, anemia, generalized weakness, vitamin B12 deficiency, and macrocytic anemia.  Her PMH also includes gastrointestinal bleeding secondary to AVM malformations which required IVC filter placement and breast CA.  She underwent cardiac catheterization 7/23 which showed no obstructive coronary disease, pulmonary hypertension, moderate aortic stenosis with a mean gradient of 16 mmHg.     She was seen in the emergency room on 06/04/2023 due to increased shortness of breath, chest tightness, and lower extremity edema.  Her oxygen  saturation was noted to be 80% on room air.  Her BNP was 2680.  Her chest x-ray showed stable cardiomegaly and pericardial effusion with no pulmonary edema or pulmonary effusion.  Echocardiogram during that time showed LVEF of 70-75%, moderate concentric LVH, mildly reduced RV function and severely dilated LA.  Large pericardial effusion was noted.  This was slightly increased from June.  There was no evidence of tamponade or severe MAC and moderate  AS was noted.  She received IV diuresis and was discharged in stable condition 06/06/2023.  She was seen in follow-up by Katlyn West NP on 06/19/2023.  She reported that she was doing well from a cardiac standpoint.  She denied chest pain and felt her breathing had returned to baseline.  She had been monitoring her weights at home which were stable.  She was having regular lab work at the cancer center and had planned lab work on 06/30/2023.  She contacted the nurse triage line 08/04/2023 and reported that she had increased lower extremity swelling.  She reported a 10 pound weight gain since 8/15 and another 2 pound weight gain since 9/25.  She was taking furosemide  40 mg once daily.  She continued to use lower extremity support stockings.  She was instructed to increase her furosemide  to 40 mg twice daily.  She was added to my schedule.  She presented to the clinic 08/06/23  for follow-up evaluation and stated she noticed a 6 pound weight gain overnight.  She reported that she was around 100 pounds earlier in the week.  Today in the office her weight is 108.2 pounds.  She reported that  she was not able to take 40 mg of Lasix  2 times per day so she has been taking 60 mg of Lasix  for the past 2 days.  Her right leg is greater than her left.  She has noted to have +1-2 pitting edema to her knee her right leg and generalized nonpitting edema on her left leg.  I will continued her 60 mg of Lasix  x 4 days and give her 20 mill equivalents of potassium daily. I planned repeat  BMP 1 week and planned follow-up in 1 week.   She had follow-up appointment with pulmonology that week and was stable using 3 L of oxygen  nasal cannula while active.  She presented to the emergency department on 08/13/2023.  She was diagnosed with right lower extremity cellulitis.  She received IV antibiotics and diuresis.  Her furosemide  was transitioned to torsemide .  Her echocardiogram showed known severe LVH, G1 DD, large pericardial  effusion without tamponade.  Her lower extremity ultrasound was negative for DVT.  There was low suspicion for true cellulitis however her Keflex  was continued at discharge.  She presented to the clinic  08/21/23 for follow-up evaluation and stated she had not had good urine output with torsemide .  She had been monitoring her weight at home.  She reported that when she left the hospital her weight was 93-1/2 pounds.  It had steadily increased up to 106 pounds today.  Our clinic scale showed 108 pounds.  She had been weighing after voiding in the morning with the same shoes on.  She presented with her son who asked several questions about her fluid retention and pulmonary hypertension.  All questions were answered.  I asked her to continue daily weights, stopped torsemide  and resumed furosemide  60 mg daily, ordered BMP, kept follow-up for November 14, and asked her follow-up with nephrology as scheduled.  She was admitted to the hospital on 08/29/2023.  Her weight was noted to be around 102 pounds 12 ounces.  Her echocardiogram 08/31/2023 showed an LVEF of 60-65%, mildly dilated left atria, severe mitral annular calcification with mitral stenosis.  Severe calcification of the aortic valve was also noted.  She was noted to have large circumferential pericardial effusion.  No RV diastolic collapse was noted.  She had been readmitted for weight gain, orthopnea, edema.  Her chest x-ray showed improving but persistent CHF and small pleural effusion.  She received IV diuresis.  Her SGLT2 inhibitor was restarted 08/31/2023.  She was continued on spironolactone .  Close follow-up was recommended.  She presented to the clinic 09/09/23 for follow-up evaluation and stated she felt much better  since being discharged from the hospital.  Her weight had been stable at home.  She did have some bilateral ankle edema.  She had been compliant with her medications.  She had been stable with her weight.  We reviewed her medications  and she felt much better since being started on Jardiance .  We reviewed her hospitalizations and diastolic heart failure.  By discussed referral for palliative care.  She agreed to meet with them and discuss options for management/treatment.  I had her continue her weight log, gave iron  rich foods list and asked her to pare them with vitamin C. We plan follow-up in 2 to 3 months.  She reported that she had upcoming appointment with hematology.   She was not able to tolerate Jardiance .  It was discontinued.  I changed her furosemide  to 60 mg Monday Wednesday Friday and continue to her furosemide  40  mg dosing on Tuesday Thursday Saturday Sunday.  I planned BMP with return to the clinic.      She presented to the clinic 10/30/23 for follow-up evaluation and stated she was not doing well with Jardiance .  She had been maintaining a weight log which showed a weight of 98.2 pounds at home.  Her weight in the clinic was 101.8 pounds.  She reported that she had not been taking spironolactone  for about 3 to 4 weeks.  I refilled her spironolactone .  She was on 3-4 L of oxygen .  She had upcoming appointment with pulmonology.  She did note that she was somewhat fatigued.  She had upcoming echocardiogram planned for February.  I  decreased her Lasix  to 40 mg daily,  ordered BMP and planned follow-up in 2 to 4 months.  She presented to the emergency department 12/04/2023.  With large pericardial effusion that had been diagnosed/seen on echo 12/03/2023.  Her EKG at that time showed sinus rhythm with PACs 80 bpm.  She was without complaint on exam.  She did note some mild shortness of breath.  She was maintained on 2 L of oxygen .  She denied any active chest discomfort.  She denied fever, productive cough, lightheadedness, dizziness, nausea vomiting and back pain.  Rales were noted at lung bases.  Chest x-ray showed cardiomegaly which was associated with underlying pericardial effusion.  Mild pulmonary edema was noted.  Dr.  Verlin evaluated patient and felt that her pericardial effusion was chronic in nature.  She presented to the clinic 12/28/23 for follow-up evaluation and stated she was now using 3 L of oxygen  via nasal cannula with activity.  In the office  she was not using her oxygen  in the exam room.  Her oxygen  saturation was 94%.  Her blood pressure was well-controlled.  We reviewed her recent echocardiogram and emergency department visit.  She reported that she had an appointment with nephrology on Wednesday.  She was tolerating her medications well without side effects. Follow-up in 4 to 6 months was planned.  She presents to the clinic today for follow-up evaluation and states she  has been requiring increased nasal cannula oxygen .  She reports compliance with her CPAP.  She has been using this with naps as well.  She does note that she is having increased confusion and shortness of breath.  She notes that this morning she took her furosemide  along with her spironolactone .  Her weight is slightly up from previous (about 1 pound) on exam she has bilateral ankle/shin edema.  She has Rales throughout all lung fields.  In the clinic today she is using 5 L nasal cannula.  She notes that she had the same symptoms prior to her previous hospitalization.  She requests that she be placed on BiPAP after she touches base with her husband and family.  We reviewed her previous clinic visit and past medical history.  We reviewed her recent lab work which shows stable CO2.  I will contact card master and plan follow-up after hospitalization.  I would recommend that she have BiPAP, IV diuresis, CBC, CMP, and needs palliative care, hospice care consult.  Today she denies  palpitations, melena, hematuria, hemoptysis, diaphoresis, weakness, presyncope, syncope, orthopnea, and PND.   Home Medications    Prior to Admission medications   Medication Sig Start Date End Date Taking? Authorizing Provider  acetaminophen  (TYLENOL ) 325 MG  tablet Take 2 tablets (650 mg total) by mouth every 6 (six) hours as needed for moderate pain.  07/18/22   Christopher Savannah, PA-C  albuterol  (VENTOLIN  HFA) 108 [phone] Base) MCG/ACT inhaler INHALE 2 PUFFS INTO THE LUNGS EVERY 6 HOURS AS NEEDED FOR WHEEZING OR SHORTNESS OF BREATH 03/13/23   Rollene Almarie LABOR, MD  augmented betamethasone dipropionate (DIPROLENE-AF) 0.05 % cream Apply topically 2 (two) times daily as needed. 07/13/23   [provider]  clobetasol ointment (TEMOVATE) 0.05 % Apply 1 Application topically 2 (two) times daily. 04/20/23   [provider]  Cyanocobalamin  (VITAMIN B-12) 1000 MCG SUBL Place 1,000 mcg under the tongue. Taking twice a week    [provider]  docusate sodium  (COLACE) 100 MG capsule Take 1 capsule (100 mg total) by mouth 2 (two) times daily. Patient taking differently: Take 100 mg by mouth 2 (two) times daily. Pt takes every morning 10/15/22   Edmisten, Kristie L, PA  famotidine  (PEPCID ) 20 MG tablet Take 1 tablet (20 mg total) by mouth at bedtime. 02/10/23   Nandigam, Kavitha V, MD  fluticasone  (FLONASE ) 50 MCG/ACT nasal spray Place 1 spray into both nostrils daily as needed for allergies. Patient taking differently: Place 1 spray into both nostrils in the morning and at bedtime. 05/15/22   Rollene Almarie LABOR, MD  furosemide  (LASIX ) 20 MG tablet Take 2 tablets (40 mg total) by mouth daily. 06/19/23   West, Katlyn D, NP  guaiFENesin  (MUCINEX ) 600 MG 12 hr tablet Take 300 mg by mouth daily.    [provider]  ipratropium (ATROVENT ) 0.03 % nasal spray Place 2 sprays into both nostrils every 12 (twelve) hours. 05/12/22   Kara Dorn NOVAK, MD  latanoprost  (XALATAN ) 0.005 % ophthalmic solution Place 1 drop into both eyes at bedtime. 09/23/22   [provider]  magic mouthwash SOLN Take 10 mLs by mouth 4 (four) times daily as needed for mouth pain. 03/22/22   Jillian Buttery, MD  mirtazapine  (REMERON ) 15 MG tablet Take 1 tablet (15 mg  total) by mouth at bedtime. 03/13/23   Rollene Almarie LABOR, MD  montelukast  (SINGULAIR ) 10 MG tablet TAKE 1 TABLET BY MOUTH AT BEDTIME 07/31/23   Rollene Almarie LABOR, MD  Multiple Vitamin (MULTIVITAMIN) capsule Take 1 capsule by mouth daily.    [provider]  mupirocin  ointment (BACTROBAN ) 2 % Apply 1 Application topically 2 (two) times daily as needed (dry skin).    [provider]  OPSUMIT  10 MG tablet Take 1 tablet (10 mg total) by mouth daily. 07/07/23   Parrett, Madelin RAMAN, NP  OXYGEN  Inhale into the lungs. 2 LMP at night and upon exertion    [provider]  pantoprazole  (PROTONIX ) 40 MG tablet Take 1 tablet (40 mg total) by mouth daily. 02/10/23   Nandigam, Kavitha V, MD  polyethylene glycol (MIRALAX  / GLYCOLAX ) 17 g packet Take 17 g by mouth daily. 10/15/22   Edmisten, Kristie L, PA  spironolactone  (ALDACTONE ) 25 MG tablet Take 1 tablet (25 mg total) by mouth daily. 08/03/23   Merlynn Niki FALCON, FNP  sucralfate  (CARAFATE ) 1 GM/10ML suspension TAKE 10 MLS BY MOUTH EVERY 6 HOURS AS NEEDED FOR REFLUX 02/11/23   Nandigam, Kavitha V, MD  triamcinolone  cream (KENALOG ) 0.1 % Apply 1 Application topically 2 (two) times daily. 07/07/23   [provider]    Family History    Family History  Problem Relation Age of Onset   Bladder Cancer Father    Diabetes Father    Prostate cancer Father    Alzheimer's disease Mother    Diabetes Sister  Lung cancer Sister         smoker   Esophageal cancer Paternal Uncle    Colon cancer Neg Hx    She indicated that her mother is deceased. She indicated that her father is deceased. She indicated that the status of her sister is unknown. She indicated that the status of her paternal uncle is unknown. She indicated that the status of her neg hx is unknown.  Social History    Social History   Socioeconomic History   Marital status: Married    Spouse name: Not on file   Number of children: 2   Years of education: Not  on file   Highest education level: Not on file  Occupational History   Occupation: Retired  Tobacco Use   Smoking status: Never   Smokeless tobacco: Never  Vaping Use   Vaping status: Never Used  Substance and Sexual Activity   Alcohol use: No   Drug use: No   Sexual activity: Not Currently  Other Topics Concern   Not on file  Social History Narrative   Married '611 son- '65, 1 daughter '63; 6 children (2 adopted)SO- SOBRetirement- doing well. Marriage in good health. Patient has never smoked. Alcohol use- noPt gets regular exercise   Social Drivers of Health   Financial Resource Strain: Low Risk  (04/02/2022)   Overall Financial Resource Strain (CARDIA)    Difficulty of Paying Living Expenses: Not hard at all  Food Insecurity: No Food Insecurity (03/24/2024)   Hunger Vital Sign    Worried About Running Out of Food in the Last Year: Never true    Ran Out of Food in the Last Year: Never true  Transportation Needs: No Transportation Needs (03/24/2024)   PRAPARE - Administrator, Civil Service (Medical): No    Lack of Transportation (Non-Medical): No  Physical Activity: Inactive (04/02/2022)   Exercise Vital Sign    Days of Exercise per Week: 0 days    Minutes of Exercise per Session: 0 min  Stress: No Stress Concern Present (04/02/2022)   [employer] of Occupational Health - Occupational Stress Questionnaire    Feeling of Stress : Not at all  Social Connections: Socially Integrated (03/24/2024)   Social Connection and Isolation Panel    Frequency of Communication with Friends and Family: More than three times a week    Frequency of Social Gatherings with Friends and Family: More than three times a week    Attends Religious Services: More than 4 times per year    Active Member of [employer] or Organizations: Yes    Attends Engineer, structural: More than 4 times per year    Marital Status: Married  Catering manager Violence: Not At Risk (03/24/2024)    Humiliation, Afraid, Rape, and Kick questionnaire    Fear of Current or Ex-Partner: No    Emotionally Abused: No    Physically Abused: No    Sexually Abused: No     Review of Systems    General:  No chills, fever, night sweats or weight changes.  Cardiovascular:  No chest pain, dyspnea on exertion, edema, orthopnea, palpitations, paroxysmal nocturnal dyspnea. Dermatological: No rash, lesions/masses Respiratory: No cough, dyspnea Urologic: No hematuria, dysuria Abdominal:   No nausea, vomiting, diarrhea, bright red blood per rectum, melena, or hematemesis Neurologic:  No visual changes, wkns, changes in mental status. All other systems reviewed and are otherwise negative except as noted above.  Physical Exam    VS:  BP  133/64 (BP Location: Right Arm, Patient Position: Sitting, Cuff Size: Normal)   Pulse 78   Ht 5' 3 (1.6 m)   Wt 102 lb 12.8 oz (46.6 kg)   SpO2 99%   BMI 18.21 kg/m  , BMI Body mass index is 18.21 kg/m. GEN: Well nourished, well developed, in no acute distress. HEENT: normal. Neck: Supple, no JVD, carotid bruits, or masses. Cardiac: RRR, 4/6 systolicmurmur heard along right sternal border, rubs, or gallops. No clubbing, cyanosis, Bilateral ankle 1+ pitting edema.  Radials/DP/PT 2+ and equal bilaterally.  Respiratory:  Respirations somewhat labored with crackles throughout all lung fields . GI: Soft, nontender, nondistended, BS + x 4. MS: no deformity or atrophy. Skin: warm and dry, no rash. Neuro:  Strength and sensation are intact. Psych: Normal affect.  Accessory Clinical Findings    Recent Labs: 03/23/2024: B Natriuretic Peptide 2,711.9; Magnesium 2.0; TSH 5.953 06/02/2024: ALT 11; BUN 47; Creatinine 1.51; Hemoglobin 8.5; Platelet Count 179; Potassium 4.4; Sodium 139   Recent Lipid Panel    Component Value Date/Time   CHOL 158 06/24/2018 1004   TRIG 112.0 06/24/2018 1004   HDL 57.50 06/24/2018 1004   CHOLHDL 3 06/24/2018 1004   VLDL 22.4 06/24/2018  1004   LDLCALC 78 06/24/2018 1004         ECG personally reviewed by me today-  none today.     Echocardiogram 06/05/2023  IMPRESSIONS     1. Left ventricular ejection fraction, by estimation, is 70 to 75%. The  left ventricle has hyperdynamic function. The left ventricle has no  regional wall motion abnormalities. There is moderate concentric left  ventricular hypertrophy. Indeterminate  diastolic filling due to E-A fusion.   2. Right ventricular systolic function is mildly reduced. The right  ventricular size is normal.   3. Left atrial size was severely dilated.   4. Large pericardial effusion surrounds heart, mainly posteriorly and  lateral to LV. [place] dimension is 5.6 cm posteriorly. It has increased  in size from echo done in June 2024. Does not meet criteria for tamponade  by echo criteria. Clinical  correlation indicated .SABRA Large pericardial effusion.   5. MV annulus is severely calcified. MV is thickened with restricted  motion. Difficult to see well Mean gradient through the MV is 8 mm Hg. MVA  (Pt1/2 ) 3 cm2. Compared to echo report from June 2024, mean gradient is  incresaed (5 to 8 mm Hg) Consider  TEE to evaluate further.. Trivial mitral valve regurgitation.   6. AV is thickened, calcified with restricted motion. Peak and mean  gradients through the valve are 59 and 37 mm Hg respectively.  Dimensionless index is 0.33 consistent with moderate AS.SABRA The aortic valve  is abnormal. Aortic valve regurgitation is not  visualized.   7. The inferior vena cava is normal in size with <50% respiratory  variability, suggesting right atrial pressure of 8 mmHg.   FINDINGS   Left Ventricle: Left ventricular ejection fraction, by estimation, is 70  to 75%. The left ventricle has hyperdynamic function. The left ventricle  has no regional wall motion abnormalities. The left ventricular internal  cavity size was small. There is  moderate concentric left ventricular  hypertrophy. Indeterminate diastolic  filling due to E-A fusion.   Right Ventricle: The right ventricular size is normal. Right vetricular  wall thickness was not assessed. Right ventricular systolic function is  mildly reduced.   Left Atrium: Left atrial size was severely dilated.   Right Atrium: Right  atrial size was normal in size.   Pericardium: Large pericardial effusion surrounds heart, mainly  posteriorly and lateral to LV. [place]unak dimension is 5.6 cm posteriorly. It  has increased in size from echo done in June 2024. Does not meet criteria  for tamponade by echo criteria. Clinical  correlation indicated. A large pericardial effusion is present.   Mitral Valve: MV annulus is severely calcified. MV is thickened with  restricted motion. Difficult to see well Mean gradient through the MV is 8  mm Hg. MVA (Pt1/2 ) 3 cm2. Compared to echo report from June 2024, mean  gradient is incresaed (5 to 8 mm Hg)  Consider TEE to evaluate further. Trivial mitral valve regurgitation. MV  peak gradient, 13.2 mmHg. The mean mitral valve gradient is 8.0 mmHg.   Tricuspid Valve: The tricuspid valve is normal in structure. Tricuspid  valve regurgitation is mild.   Aortic Valve: AV is thickened, calcified with restricted motion. Peak and  mean gradients through the valve are 59 and 37 mm Hg respectively.  Dimensionless index is 0.33 consistent with moderate AS. The aortic valve  is abnormal. Aortic valve regurgitation   is not visualized. Aortic valve mean gradient measures 37.0 mmHg. Aortic  valve peak gradient measures 58.7 mmHg. Aortic valve area, by VTI measures  0.95 cm.   Pulmonic Valve: The pulmonic valve was not well visualized. Pulmonic valve  regurgitation is mild.   Aorta: The aortic root and ascending aorta are structurally normal, with  no evidence of dilitation.   Venous: The inferior vena cava is normal in size with less than 50%  respiratory variability, suggesting right  atrial pressure of 8 mmHg.   IAS/Shunts: No atrial level shunt detected by color flow Doppler.   Echocardiogram 08/15/2023 IMPRESSIONS     1. Left ventricular ejection fraction, by estimation, is >75%. The left  ventricle has hyperdynamic function. The left ventricle has no regional  wall motion abnormalities. There is severe left ventricular hypertrophy.  Left ventricular diastolic  parameters are consistent with Grade I diastolic dysfunction (impaired  relaxation).   2. Right ventricular systolic function is normal. The right ventricular  size is normal.   3. Left atrial size was severely dilated.   4. Large pericardial effusion. The pericardial effusion is  circumferential. There is no evidence of cardiac tamponade.   5. The mitral valve is normal in structure. No evidence of mitral valve  regurgitation. No evidence of mitral stenosis. Severe mitral annular  calcification.   6. The aortic valve is normal in structure. There is severe calcifcation  of the aortic valve. There is moderate thickening of the aortic valve.  Aortic valve regurgitation is not visualized. Mild aortic valve stenosis.  Aortic valve mean gradient measures   14.8 mmHg. Aortic valve Vmax measures 2.59 m/s.   7. The inferior vena cava is normal in size with greater than 50%  respiratory variability, suggesting right atrial pressure of 3 mmHg.   Comparison(s): Large pericardial effusion noted on prior ECHO, no major  change.   Conclusion(s)/Recommendation(s): Findings consistent with hypertrophic  cardiomyopathy.   FINDINGS   Left Ventricle: Left ventricular ejection fraction, by estimation, is  >75%. The left ventricle has hyperdynamic function. The left ventricle has  no regional wall motion abnormalities. The left ventricular internal  cavity size was normal in size. There  is severe left ventricular hypertrophy. Left ventricular diastolic  parameters are consistent with Grade I diastolic dysfunction  (impaired  relaxation).   Right Ventricle: The right  ventricular size is normal. No increase in  right ventricular wall thickness. Right ventricular systolic function is  normal.   Left Atrium: Left atrial size was severely dilated.   Right Atrium: Right atrial size was normal in size.   Pericardium: A large pericardial effusion is present. The pericardial  effusion is circumferential. There is no evidence of cardiac tamponade.   Mitral Valve: The mitral valve is normal in structure. Severe mitral  annular calcification. No evidence of mitral valve regurgitation. No  evidence of mitral valve stenosis.   Tricuspid Valve: The tricuspid valve is normal in structure. Tricuspid  valve regurgitation is mild . No evidence of tricuspid stenosis.   Aortic Valve: The aortic valve is normal in structure. There is severe  calcifcation of the aortic valve. There is moderate thickening of the  aortic valve. Aortic valve regurgitation is not visualized. Mild aortic  stenosis is present. Aortic valve mean  gradient measures 14.8 mmHg. Aortic valve peak gradient measures 26.9  mmHg. Aortic valve area, by VTI measures 1.12 cm.   Pulmonic Valve: The pulmonic valve was normal in structure. Pulmonic valve  regurgitation is not visualized. No evidence of pulmonic stenosis.   Aorta: The aortic root is normal in size and structure.   Venous: The inferior vena cava is normal in size with greater than 50%  respiratory variability, suggesting right atrial pressure of 3 mmHg.   IAS/Shunts: No atrial level shunt detected by color flow Doppler.   Echocardiogram 08/31/2023 IMPRESSIONS     1. Left ventricular ejection fraction, by estimation, is 60 to 65%. The  left ventricle has normal function. The left ventricle has no regional  wall motion abnormalities.   2. Right ventricular systolic function is normal. The right ventricular  size is normal.   3. Left atrial size was mildly dilated.   4.  Severe mitral annular calcification. Visually there is mitral stenosis  but doppler interrogation was not done.   5. The aortic valve is tricuspid. There is severe calcifcation of the  aortic valve. Visually there is aortic stenosis but doppler interrogation  was not done.   6. The inferior vena cava is normal in size with <50% respiratory  variability, suggesting right atrial pressure of 8 mmHg.   7. There was a large circumferential pericardial effusion. There was no  RV diastolic collapse noted. Mitral E inflow velocity showed < 25%  respirophasic variation. The IVC was not significantly dilated. There does  not appear to be tamponade physiology.   8. Limited echo.   FINDINGS   Left Ventricle: Left ventricular ejection fraction, by estimation, is 60  to 65%. The left ventricle has normal function. The left ventricle has no  regional wall motion abnormalities. The left ventricular internal cavity  size was normal in size.   Right Ventricle: The right ventricular size is normal. No increase in  right ventricular wall thickness. Right ventricular systolic function is  normal.   Left Atrium: Left atrial size was mildly dilated.   Right Atrium: Right atrial size was normal in size.   Pericardium: There was a large circumferential pericardial effusion. There  was no RV diastolic collapse noted. Mitral E inflow velocity showed < 25%  respirophasic variation. The IVC was not significantly dilated. There does  not appear to be tamponade  physiology.   Mitral Valve: There is moderate calcification of the mitral valve  leaflet(s). Severe mitral annular calcification.   Aortic Valve: The aortic valve is tricuspid. There is severe calcifcation  of the aortic valve.   Venous: The inferior vena cava is normal in size with less than 50%  respiratory variability, suggesting right atrial pressure of 8 mmHg.   Additional Comments: Spectral Doppler performed. Color Doppler performed.     IVC  IVC diam: 1.50 cm   Dalton McleanMD  Electronically signed by Ezra Kanner  Signature Date/Time: 08/31/2023/4:29:29 PM      Echocardiogram 12/03/2023 IMPRESSIONS     1. Left ventricular ejection fraction, by estimation, is 70 to 75%. The  left ventricle has hyperdynamic function. The left ventricle has no  regional wall motion abnormalities. There is severe concentric left  ventricular hypertrophy. Left ventricular  diastolic parameters are consistent with Grade I diastolic dysfunction  (impaired relaxation). Elevated left ventricular end-diastolic pressure.   2. Right ventricular systolic function is normal. The right ventricular  size is normal.   3. There effusion measures 5.06cm at largest diameter posteriorly and  2.29cm anteriorly.. Large pericardial effusion. The pericardial effusion  is circumferential.   4. The mitral valve is normal in structure. Mild mitral valve  regurgitation. Mild to moderate mitral stenosis. The mean mitral valve  gradient is 6.0 mmHg.   5. Tricuspid valve regurgitation is moderate.   6. The aortic valve is tricuspid. There is moderate calcification of the  aortic valve. There is moderate thickening of the aortic valve. Aortic  valve regurgitation is not visualized. Moderate aortic valve stenosis.  Aortic valve area, by VTI measures  1.17 cm. Aortic valve mean gradient measures 21.0 mmHg. Aortic valve Vmax  measures 3.14 m/s.   7. Compared to study dated 01/15/23 the pericardial effusion has increased  in diameter posteriorly (4cm prior andnow 5cm) and there is now a 40mm  respirophasic change in MV inflow velocity (2mm on prior echo). There is  no overt diastolic RV collapse, but   both the RV and LV are very underfilled. There is also now moderate AS.   8. Case discussed with Dr. Pietro who recommended patient come in to  office 2/14 for DOD visit if asymptomatic today.   FINDINGS   Left Ventricle: Left ventricular ejection  fraction, by estimation, is 70  to 75%. The left ventricle has hyperdynamic function. The left ventricle  has no regional wall motion abnormalities. Strain imaging was not  performed. The left ventricular internal  cavity size was normal in size. There is severe concentric left  ventricular hypertrophy. Left ventricular diastolic parameters are  consistent with Grade I diastolic dysfunction (impaired relaxation).  Elevated left ventricular end-diastolic pressure.   Right Ventricle: The right ventricular size is normal. No increase in  right ventricular wall thickness. Right ventricular systolic function is  normal.   Left Atrium: Left atrial size was normal in size.   Right Atrium: Right atrial size was normal in size.   Pericardium: There effusion measures 5.06cm at largest diameter  posteriorly and 2.29cm anteriorly. A large pericardial effusion is  present. The pericardial effusion is circumferential. There is diastolic  collapse of the right atrial wall, diastolic  collapse of the right ventricular free wall and excessive respiratory  variation in the mitral valve spectral Doppler velocities.   Mitral Valve: The mitral valve is normal in structure. Mild mitral valve  regurgitation. Mild to moderate mitral valve stenosis. MV peak gradient,  12.1 mmHg. The mean mitral valve gradient is 6.0 mmHg.   Tricuspid Valve: The tricuspid valve is normal in structure. Tricuspid  valve regurgitation is moderate . No evidence of tricuspid stenosis.  Aortic Valve: The aortic valve is tricuspid. There is moderate  calcification of the aortic valve. There is moderate thickening of the  aortic valve. Aortic valve regurgitation is not visualized. Moderate  aortic stenosis is present. Aortic valve mean  gradient measures 21.0 mmHg. Aortic valve peak gradient measures 39.4  mmHg. Aortic valve area, by VTI measures 1.17 cm.   Pulmonic Valve: The pulmonic valve was normal in structure. Pulmonic  valve  regurgitation is mild. No evidence of pulmonic stenosis.   Aorta: The aortic root is normal in size and structure.   Venous: The inferior vena cava was not well visualized.   IAS/Shunts: No atrial level shunt detected by color flow Doppler.     Cardiac catheterization 04/25/2022  1.  Ectatic but patent coronary arteries with no obstructive disease.  Right dominant coronary artery. 2.  Mild pulmonary hypertension with PA pressure 52/15, mean 28 mmHg, transpulmonary gradient 10 mmHg, PVR approximately 2 Wood units. 3.  Moderate aortic stenosis with mean transvalvular gradient 16 mmHg calculated aortic valve area 1.16 cm 4.  Heavy mitral annular calcification by plain fluoroscopy 5.  Mild aortic valve calcification by plain fluoroscopy   Recommend: Continued medical therapy, clinical follow-up, echo surveillance.  The patient does not appear to have severe aortic stenosis and I do not think she would benefit from TAVR at this time.   Diagnostic Dominance: Right  Intervention   Assessment & Plan   1.  Chronic diastolic CHF- Wt today 102.8 lbs. She does have bilateral lower extremity edema 1+ pitting ankle.  She was not able to tolerate Jardiance  medication.  She reports increased work of breathing and increased oxygenation needs.  She notes she has not sharp with her cognition and admits to intermittent confusion.  She reports that she did take her Lasix  and spironolactone  this morning.  She requests hospital admission. Continue  spironolactone , Lasix  to 40 mg daily Heart healthy low-sodium diet Continue daily weights and weight log Recommend CBC, CMP, BiPAP, IV diuresis She needs palliative care/hospice consult Case reviewed with DOD, Dr. Wonda  Essential hypertension-BP today 133/64 Maintain blood pressure log Continue spironolactone , furosemide , diltiazem  Heart healthy low-sodium diet  Pulmonary hypertension-on 4-5L nasal cannula.  Reports increased work of breathing  and confusion.  She has bilateral Rales throughout mid and base lung fields.   May proceed to the hospital for BiPAP and IV diuresis. Will reach out to [facility] to let them know she is on her way.  Aortic stenosis-on 5 L of oxygen  via nasal cannula.  4/6 systolic murmur heard along right sternal border. Continue furosemide , spironolactone    Pericardial effusion-chronic.  Previously noted to be stable.  Large pleural effusion noted.  Continue current medical therapy  CKD stage III-creatinine 1.51 on 12/04/23.    Avoid nephrotoxic agents.   Follows with PCP/nephrology   Disposition: Follow-up with Dr. Pietro or me in 1-2 month.   Josefa HERO. Venetia Prewitt NP-C     06/14/2024, 2:11 PM [facility] [address] Office [phone] Fax [phone]    I spent 13 minutes examining this patient, reviewing medications, and using patient centered shared decision making involving her cardiac care.   I spent greater than 20 minutes reviewing her past medical history,  medications, and prior cardiac tests.

## 2024-06-14 ENCOUNTER — Ambulatory Visit (INDEPENDENT_AMBULATORY_CARE_PROVIDER_SITE_OTHER): Admitting: Family Medicine

## 2024-06-14 ENCOUNTER — Ambulatory Visit: Attending: General Practice | Admitting: General Practice

## 2024-06-14 ENCOUNTER — Other Ambulatory Visit: Payer: Self-pay

## 2024-06-14 ENCOUNTER — Encounter: Payer: Self-pay | Admitting: Family Medicine

## 2024-06-14 ENCOUNTER — Encounter: Payer: Self-pay | Admitting: General Practice

## 2024-06-14 ENCOUNTER — Encounter (HOSPITAL_COMMUNITY): Payer: Self-pay

## 2024-06-14 ENCOUNTER — Inpatient Hospital Stay (HOSPITAL_COMMUNITY)
Admission: EM | Admit: 2024-06-14 | Discharge: 2024-06-16 | DRG: 177 | Disposition: A | Discharge status: Home / Self Care | Source: Ambulatory Visit | Attending: Internal Medicine | Admitting: Internal Medicine

## 2024-06-14 VITALS — BP 132/60 | HR 76 | Temp 98.6°F | Ht 63.0 in | Wt 102.0 lb

## 2024-06-14 VITALS — BP 133/64 | HR 78 | Ht 63.0 in | Wt 102.8 lb

## 2024-06-14 DIAGNOSIS — I5033 Acute on chronic diastolic (congestive) heart failure: Secondary | ICD-10-CM | POA: Diagnosis present

## 2024-06-14 DIAGNOSIS — I350 Nonrheumatic aortic (valve) stenosis: Secondary | ICD-10-CM | POA: Diagnosis not present

## 2024-06-14 DIAGNOSIS — I272 Pulmonary hypertension, unspecified: Secondary | ICD-10-CM

## 2024-06-14 DIAGNOSIS — J9622 Acute and chronic respiratory failure with hypercapnia: Secondary | ICD-10-CM | POA: Diagnosis present

## 2024-06-14 DIAGNOSIS — Z86711 Personal history of pulmonary embolism: Secondary | ICD-10-CM | POA: Diagnosis not present

## 2024-06-14 DIAGNOSIS — Z9842 Cataract extraction status, left eye: Secondary | ICD-10-CM

## 2024-06-14 DIAGNOSIS — I3139 Other pericardial effusion (noninflammatory): Secondary | ICD-10-CM | POA: Diagnosis not present

## 2024-06-14 DIAGNOSIS — I2781 Cor pulmonale (chronic): Secondary | ICD-10-CM | POA: Diagnosis not present

## 2024-06-14 DIAGNOSIS — Z800 Family history of malignant neoplasm of digestive organs: Secondary | ICD-10-CM

## 2024-06-14 DIAGNOSIS — Z853 Personal history of malignant neoplasm of breast: Secondary | ICD-10-CM | POA: Diagnosis not present

## 2024-06-14 DIAGNOSIS — I517 Cardiomegaly: Secondary | ICD-10-CM | POA: Diagnosis not present

## 2024-06-14 DIAGNOSIS — R5383 Other fatigue: Secondary | ICD-10-CM | POA: Diagnosis not present

## 2024-06-14 DIAGNOSIS — D631 Anemia in chronic kidney disease: Secondary | ICD-10-CM | POA: Diagnosis present

## 2024-06-14 DIAGNOSIS — Z820 Family history of epilepsy and other diseases of the nervous system: Secondary | ICD-10-CM

## 2024-06-14 DIAGNOSIS — Z882 Allergy status to sulfonamides status: Secondary | ICD-10-CM

## 2024-06-14 DIAGNOSIS — Z9841 Cataract extraction status, right eye: Secondary | ICD-10-CM

## 2024-06-14 DIAGNOSIS — I080 Rheumatic disorders of both mitral and aortic valves: Secondary | ICD-10-CM | POA: Diagnosis present

## 2024-06-14 DIAGNOSIS — U071 COVID-19: Secondary | ICD-10-CM | POA: Insufficient documentation

## 2024-06-14 DIAGNOSIS — J9621 Acute and chronic respiratory failure with hypoxia: Secondary | ICD-10-CM | POA: Diagnosis present

## 2024-06-14 DIAGNOSIS — R54 Age-related physical debility: Secondary | ICD-10-CM | POA: Diagnosis present

## 2024-06-14 DIAGNOSIS — J811 Chronic pulmonary edema: Secondary | ICD-10-CM | POA: Diagnosis not present

## 2024-06-14 DIAGNOSIS — K219 Gastro-esophageal reflux disease without esophagitis: Secondary | ICD-10-CM | POA: Diagnosis present

## 2024-06-14 DIAGNOSIS — Z885 Allergy status to narcotic agent status: Secondary | ICD-10-CM | POA: Diagnosis not present

## 2024-06-14 DIAGNOSIS — J84112 Idiopathic pulmonary fibrosis: Secondary | ICD-10-CM | POA: Insufficient documentation

## 2024-06-14 DIAGNOSIS — Z9981 Dependence on supplemental oxygen: Secondary | ICD-10-CM | POA: Diagnosis not present

## 2024-06-14 DIAGNOSIS — Z888 Allergy status to other drugs, medicaments and biological substances status: Secondary | ICD-10-CM

## 2024-06-14 DIAGNOSIS — I2721 Secondary pulmonary arterial hypertension: Secondary | ICD-10-CM | POA: Diagnosis present

## 2024-06-14 DIAGNOSIS — I110 Hypertensive heart disease with heart failure: Secondary | ICD-10-CM | POA: Diagnosis not present

## 2024-06-14 DIAGNOSIS — Z515 Encounter for palliative care: Secondary | ICD-10-CM | POA: Diagnosis not present

## 2024-06-14 DIAGNOSIS — Z8052 Family history of malignant neoplasm of bladder: Secondary | ICD-10-CM

## 2024-06-14 DIAGNOSIS — J9612 Chronic respiratory failure with hypercapnia: Secondary | ICD-10-CM | POA: Diagnosis not present

## 2024-06-14 DIAGNOSIS — I5031 Acute diastolic (congestive) heart failure: Secondary | ICD-10-CM | POA: Diagnosis present

## 2024-06-14 DIAGNOSIS — N1832 Chronic kidney disease, stage 3b: Secondary | ICD-10-CM

## 2024-06-14 DIAGNOSIS — Z801 Family history of malignant neoplasm of trachea, bronchus and lung: Secondary | ICD-10-CM

## 2024-06-14 DIAGNOSIS — Z66 Do not resuscitate: Secondary | ICD-10-CM | POA: Diagnosis present

## 2024-06-14 DIAGNOSIS — Z681 Body mass index (BMI) 19 or less, adult: Secondary | ICD-10-CM

## 2024-06-14 DIAGNOSIS — Z79899 Other long term (current) drug therapy: Secondary | ICD-10-CM

## 2024-06-14 DIAGNOSIS — I5032 Chronic diastolic (congestive) heart failure: Secondary | ICD-10-CM

## 2024-06-14 DIAGNOSIS — J849 Interstitial pulmonary disease, unspecified: Secondary | ICD-10-CM | POA: Diagnosis present

## 2024-06-14 DIAGNOSIS — R0602 Shortness of breath: Secondary | ICD-10-CM | POA: Diagnosis not present

## 2024-06-14 DIAGNOSIS — Z91041 Radiographic dye allergy status: Secondary | ICD-10-CM

## 2024-06-14 DIAGNOSIS — I130 Hypertensive heart and chronic kidney disease with heart failure and stage 1 through stage 4 chronic kidney disease, or unspecified chronic kidney disease: Secondary | ICD-10-CM | POA: Diagnosis present

## 2024-06-14 DIAGNOSIS — D649 Anemia, unspecified: Secondary | ICD-10-CM | POA: Diagnosis not present

## 2024-06-14 DIAGNOSIS — J9611 Chronic respiratory failure with hypoxia: Secondary | ICD-10-CM | POA: Diagnosis not present

## 2024-06-14 DIAGNOSIS — Z7984 Long term (current) use of oral hypoglycemic drugs: Secondary | ICD-10-CM

## 2024-06-14 DIAGNOSIS — I10 Essential (primary) hypertension: Secondary | ICD-10-CM

## 2024-06-14 DIAGNOSIS — Z8042 Family history of malignant neoplasm of prostate: Secondary | ICD-10-CM

## 2024-06-14 DIAGNOSIS — Z85528 Personal history of other malignant neoplasm of kidney: Secondary | ICD-10-CM

## 2024-06-14 DIAGNOSIS — R636 Underweight: Secondary | ICD-10-CM | POA: Diagnosis present

## 2024-06-14 DIAGNOSIS — Z833 Family history of diabetes mellitus: Secondary | ICD-10-CM | POA: Diagnosis not present

## 2024-06-14 DIAGNOSIS — I342 Nonrheumatic mitral (valve) stenosis: Secondary | ICD-10-CM | POA: Diagnosis not present

## 2024-06-14 DIAGNOSIS — R918 Other nonspecific abnormal finding of lung field: Secondary | ICD-10-CM | POA: Diagnosis not present

## 2024-06-14 LAB — POC COVID19 BINAXNOW: SARS Coronavirus 2 Ag: POSITIVE — AB

## 2024-06-14 MED ORDER — NIRMATRELVIR/RITONAVIR (PAXLOVID) TABLET (RENAL DOSING): 2.0000 | ORAL_TABLET | Freq: Two times a day (BID) | ORAL | 0 refills | Status: AC

## 2024-06-14 NOTE — Patient Instructions (Addendum)
 I have sent in Paxlovid  for you to take twice a day for 5 days.    I recommend you eat with this medication, as it can upset your stomach if you do not.  This medication can sometimes leave a bad taste in your mouth, it is okay to use mints, gum, to help combat this.    If this occurs, it is temporary and will resolve once the medication course is completed. There is a minor risk of rebound COVID after taking Paxlovid .  Continue supportive care at home, make sure you are drinking plenty of fluids and getting some rest.  May take ibuprofen  and Tylenol  as needed for fever, aches and pains.  Stay home and away from everyone until you are 24 hours fever free without fever reducing medications.  Follow-up with me if symptoms are persisting over the next week.  Follow-up in the emergency room if you are experiencing shortness of breath, high fever, unremitting cough, severe fatigue, other concerning symptoms.

## 2024-06-14 NOTE — ED Triage Notes (Signed)
 Pt sent by PCP for elevated CO2, states she needs BiPap, diuresis, and a Hospice consult. Pt husband has been sick for a week with Covid, pt tested positive today.

## 2024-06-14 NOTE — Progress Notes (Signed)
 Acute Office Visit  Subjective:     Patient ID: Paula Ward, female    DOB: 10/01/1936, 88 y.o.   MRN: [id number]  No chief complaint on file.   HPI  88 year old female with significant history of idiopathic pulmonary fibrosis, oxygen  dependence, CHF, pulmonary arterial hypertension, chronic respiratory failure presents for evaluation of COVID exposure.  Reports husband has been sick for the last 3 weeks. She has been recently experiencing worsening shortness of breath, mild cough, increased fatigue for the last 4 to 5 days.  She just left a cardiology appointment, and was instructed to go to the ER for further evaluation and possible need for BiPAP.  ROS Per HPI      Objective:    BP 132/60 (BP Location: Left Arm, Patient Position: Sitting)   Pulse 76   Temp 98.6 F (37 C) (Temporal)   Ht 5' 3 (1.6 m)   Wt 102 lb (46.3 kg)   SpO2 93%   BMI 18.07 kg/m    Physical Exam Vitals and nursing note reviewed.  Constitutional:      General: She is not in acute distress.    Comments: Appears fatigued, chronically ill-appearing, frail elderly  HENT:     Head: Normocephalic and atraumatic.     Right Ear: External ear normal.     Left Ear: External ear normal.     Nose: Nose normal.     Mouth/Throat:     Mouth: Mucous membranes are moist.     Pharynx: Oropharynx is clear.  Eyes:     Extraocular Movements: Extraocular movements intact.     Pupils: Pupils are equal, round, and reactive to light.  Cardiovascular:     Rate and Rhythm: Normal rate and regular rhythm.     Pulses: Normal pulses.     Heart sounds: Normal heart sounds.  Pulmonary:     Effort: Pulmonary effort is normal.     Breath sounds: Decreased air movement present.     Comments: Globally diminished breath sounds, on 2 LPM oxygen  via nasal cannula, mildly increased work of breathing Musculoskeletal:     Cervical back: Normal range of motion.     Right lower leg: No edema.     Left lower leg: No  edema.     Comments: In wheelchair  Lymphadenopathy:     Cervical: No cervical adenopathy.  Neurological:     General: No focal deficit present.     Mental Status: She is alert and oriented to person, place, and time.  Psychiatric:        Mood and Affect: Mood normal.        Thought Content: Thought content normal.     Results for orders placed or performed in visit on 06/14/24  POC COVID-19 BinaxNow  Result Value Ref Range   SARS Coronavirus 2 Ag Positive (A) Negative        Assessment & Plan:   COVID-19 -     POC COVID-19 BinaxNow -     nirmatrelvir /ritonavir  (renal dosing); Take 2 tablets by mouth 2 (two) times daily for 5 days. (Take nirmatrelvir  150 mg one tablet twice daily for 5 days and ritonavir  100 mg one tablet twice daily for 5 days) Patient GFR is 34  Dispense: 20 tablet; Refill: 0  IPF (idiopathic pulmonary fibrosis) (HCC) -     POC COVID-19 BinaxNow  PAH (pulmonary artery hypertension) (HCC) -     POC COVID-19 BinaxNow  Oxygen  dependent -  POC COVID-19 BinaxNow  Chronic respiratory failure with hypoxia (HCC) -     POC COVID-19 BinaxNow  Other fatigue -     POC COVID-19 BinaxNow   Go directly to the ER for further evaluation and treatment, possibly BiPap   Orders Placed This Encounter  Procedures   POC COVID-19 BinaxNow     Meds ordered this encounter  Medications   nirmatrelvir /ritonavir , renal dosing, (PAXLOVID ) 10 x 150 MG & 10 x 100MG  TABS    Sig: Take 2 tablets by mouth 2 (two) times daily for 5 days. (Take nirmatrelvir  150 mg one tablet twice daily for 5 days and ritonavir  100 mg one tablet twice daily for 5 days) Patient GFR is 34    Dispense:  20 tablet    Refill:  0    Return for PCP after you are released from the hospital.  Corean LITTIE Ku, FNP

## 2024-06-14 NOTE — Patient Instructions (Addendum)
 YOU NEED TO GO TO [facility] EMERGENCY ROOM.   Your physician recommends that you schedule a follow-up appointment post hospital visit

## 2024-06-15 ENCOUNTER — Other Ambulatory Visit: Payer: Self-pay

## 2024-06-15 ENCOUNTER — Emergency Department (HOSPITAL_COMMUNITY)

## 2024-06-15 DIAGNOSIS — I2781 Cor pulmonale (chronic): Secondary | ICD-10-CM

## 2024-06-15 DIAGNOSIS — I130 Hypertensive heart and chronic kidney disease with heart failure and stage 1 through stage 4 chronic kidney disease, or unspecified chronic kidney disease: Secondary | ICD-10-CM | POA: Diagnosis present

## 2024-06-15 DIAGNOSIS — J9611 Chronic respiratory failure with hypoxia: Secondary | ICD-10-CM | POA: Diagnosis not present

## 2024-06-15 DIAGNOSIS — N1832 Chronic kidney disease, stage 3b: Secondary | ICD-10-CM

## 2024-06-15 DIAGNOSIS — D631 Anemia in chronic kidney disease: Secondary | ICD-10-CM | POA: Diagnosis present

## 2024-06-15 DIAGNOSIS — I3139 Other pericardial effusion (noninflammatory): Secondary | ICD-10-CM | POA: Diagnosis not present

## 2024-06-15 DIAGNOSIS — J9612 Chronic respiratory failure with hypercapnia: Secondary | ICD-10-CM

## 2024-06-15 DIAGNOSIS — I350 Nonrheumatic aortic (valve) stenosis: Secondary | ICD-10-CM | POA: Diagnosis not present

## 2024-06-15 DIAGNOSIS — J9622 Acute and chronic respiratory failure with hypercapnia: Secondary | ICD-10-CM | POA: Diagnosis present

## 2024-06-15 DIAGNOSIS — Z7984 Long term (current) use of oral hypoglycemic drugs: Secondary | ICD-10-CM | POA: Diagnosis not present

## 2024-06-15 DIAGNOSIS — I5033 Acute on chronic diastolic (congestive) heart failure: Secondary | ICD-10-CM | POA: Diagnosis present

## 2024-06-15 DIAGNOSIS — K219 Gastro-esophageal reflux disease without esophagitis: Secondary | ICD-10-CM | POA: Diagnosis present

## 2024-06-15 DIAGNOSIS — Z853 Personal history of malignant neoplasm of breast: Secondary | ICD-10-CM | POA: Diagnosis not present

## 2024-06-15 DIAGNOSIS — I080 Rheumatic disorders of both mitral and aortic valves: Secondary | ICD-10-CM | POA: Diagnosis present

## 2024-06-15 DIAGNOSIS — J9621 Acute and chronic respiratory failure with hypoxia: Secondary | ICD-10-CM | POA: Diagnosis present

## 2024-06-15 DIAGNOSIS — I2721 Secondary pulmonary arterial hypertension: Secondary | ICD-10-CM | POA: Diagnosis present

## 2024-06-15 DIAGNOSIS — I5031 Acute diastolic (congestive) heart failure: Secondary | ICD-10-CM | POA: Diagnosis present

## 2024-06-15 DIAGNOSIS — Z515 Encounter for palliative care: Secondary | ICD-10-CM

## 2024-06-15 DIAGNOSIS — Z91041 Radiographic dye allergy status: Secondary | ICD-10-CM | POA: Diagnosis not present

## 2024-06-15 DIAGNOSIS — I342 Nonrheumatic mitral (valve) stenosis: Secondary | ICD-10-CM

## 2024-06-15 DIAGNOSIS — J849 Interstitial pulmonary disease, unspecified: Secondary | ICD-10-CM | POA: Diagnosis present

## 2024-06-15 DIAGNOSIS — Z833 Family history of diabetes mellitus: Secondary | ICD-10-CM | POA: Diagnosis not present

## 2024-06-15 DIAGNOSIS — Z885 Allergy status to narcotic agent status: Secondary | ICD-10-CM | POA: Diagnosis not present

## 2024-06-15 DIAGNOSIS — Z86711 Personal history of pulmonary embolism: Secondary | ICD-10-CM | POA: Diagnosis not present

## 2024-06-15 DIAGNOSIS — Z888 Allergy status to other drugs, medicaments and biological substances status: Secondary | ICD-10-CM | POA: Diagnosis not present

## 2024-06-15 DIAGNOSIS — Z85528 Personal history of other malignant neoplasm of kidney: Secondary | ICD-10-CM | POA: Diagnosis not present

## 2024-06-15 DIAGNOSIS — U071 COVID-19: Secondary | ICD-10-CM

## 2024-06-15 DIAGNOSIS — Z681 Body mass index (BMI) 19 or less, adult: Secondary | ICD-10-CM | POA: Diagnosis not present

## 2024-06-15 DIAGNOSIS — Z9981 Dependence on supplemental oxygen: Secondary | ICD-10-CM | POA: Diagnosis not present

## 2024-06-15 DIAGNOSIS — D649 Anemia, unspecified: Secondary | ICD-10-CM

## 2024-06-15 DIAGNOSIS — Z66 Do not resuscitate: Secondary | ICD-10-CM | POA: Diagnosis present

## 2024-06-15 DIAGNOSIS — Z820 Family history of epilepsy and other diseases of the nervous system: Secondary | ICD-10-CM | POA: Diagnosis not present

## 2024-06-15 DIAGNOSIS — Z882 Allergy status to sulfonamides status: Secondary | ICD-10-CM | POA: Diagnosis not present

## 2024-06-15 LAB — CBC WITH DIFFERENTIAL/PLATELET
Abs Granulocyte: 2.6 K/uL (ref 1.5–6.5)
Abs Granulocyte: 2.4 K/uL (ref 1.5–6.5)
Abs Immature Granulocytes: 0.05 K/uL (ref 0.00–0.07)
Abs Immature Granulocytes: 0.05 K/uL (ref 0.00–0.07)
Basophils Absolute: 0 K/uL (ref 0.0–0.1)
Basophils Absolute: 0 K/uL (ref 0.0–0.1)
Basophils Relative: 1 %
Basophils Relative: 1 %
Eosinophils Absolute: 0.1 K/uL (ref 0.0–0.5)
Eosinophils Absolute: 0.1 K/uL (ref 0.0–0.5)
Eosinophils Relative: 2 %
Eosinophils Relative: 3 %
HCT: 28.9 % — ABNORMAL LOW (ref 36.0–46.0)
HCT: 27.9 % — ABNORMAL LOW (ref 36.0–46.0)
Hemoglobin: 8.5 g/dL — ABNORMAL LOW (ref 12.0–15.0)
Hemoglobin: 8.6 g/dL — ABNORMAL LOW (ref 12.0–15.0)
Immature Granulocytes: 1 %
Immature Granulocytes: 1 %
Lymphocytes Relative: 24 %
Lymphocytes Relative: 26 %
Lymphs Abs: 0.9 K/uL (ref 0.7–4.0)
Lymphs Abs: 1 K/uL (ref 0.7–4.0)
MCH: 34.7 pg — ABNORMAL HIGH (ref 26.0–34.0)
MCH: 35.7 pg — ABNORMAL HIGH (ref 26.0–34.0)
MCHC: 29.4 g/dL — ABNORMAL LOW (ref 30.0–36.0)
MCHC: 30.8 g/dL (ref 30.0–36.0)
MCV: 118 fL — ABNORMAL HIGH (ref 80.0–100.0)
MCV: 115.8 fL — ABNORMAL HIGH (ref 80.0–100.0)
Monocytes Absolute: 0.3 K/uL (ref 0.1–1.0)
Monocytes Absolute: 0.2 K/uL (ref 0.1–1.0)
Monocytes Relative: 7 %
Monocytes Relative: 6 %
Neutro Abs: 2.6 K/uL (ref 1.7–7.7)
Neutro Abs: 2.4 K/uL (ref 1.7–7.7)
Neutrophils Relative %: 65 %
Neutrophils Relative %: 63 %
Platelets: 142 K/uL — ABNORMAL LOW (ref 150–400)
Platelets: 142 K/uL — ABNORMAL LOW (ref 150–400)
RBC: 2.45 MIL/uL — ABNORMAL LOW (ref 3.87–5.11)
RBC: 2.41 MIL/uL — ABNORMAL LOW (ref 3.87–5.11)
RDW: 12.8 % (ref 11.5–15.5)
RDW: 12.8 % (ref 11.5–15.5)
Smear Review: NORMAL
WBC: 4 K/uL (ref 4.0–10.5)
WBC: 3.8 K/uL — ABNORMAL LOW (ref 4.0–10.5)
nRBC: 0 % (ref 0.0–0.2)
nRBC: 0 % (ref 0.0–0.2)

## 2024-06-15 LAB — I-STAT CHEM 8, ED
BUN: 55 mg/dL — ABNORMAL HIGH (ref 8–23)
Calcium, Ion: 1.16 mmol/L (ref 1.15–1.40)
Chloride: 93 mmol/L — ABNORMAL LOW (ref 98–111)
Creatinine, Ser: 1.5 mg/dL — ABNORMAL HIGH (ref 0.44–1.00)
Glucose, Bld: 96 mg/dL (ref 70–99)
HCT: 25 % — ABNORMAL LOW (ref 36.0–46.0)
Hemoglobin: 8.5 g/dL — ABNORMAL LOW (ref 12.0–15.0)
Potassium: 4.3 mmol/L (ref 3.5–5.1)
Sodium: 140 mmol/L (ref 135–145)
TCO2: 38 mmol/L — ABNORMAL HIGH (ref 22–32)

## 2024-06-15 LAB — BASIC METABOLIC PANEL WITH GFR
Anion gap: 11 (ref 5–15)
BUN: 53 mg/dL — ABNORMAL HIGH (ref 8–23)
CO2: 32 mmol/L (ref 22–32)
Calcium: 8.8 mg/dL — ABNORMAL LOW (ref 8.9–10.3)
Chloride: 99 mmol/L (ref 98–111)
Creatinine, Ser: 1.37 mg/dL — ABNORMAL HIGH (ref 0.44–1.00)
GFR, Estimated: 38 mL/min — ABNORMAL LOW (ref >60)
Glucose, Bld: 95 mg/dL (ref 70–99)
Potassium: 4.5 mmol/L (ref 3.5–5.1)
Sodium: 142 mmol/L (ref 135–145)

## 2024-06-15 LAB — COMPREHENSIVE METABOLIC PANEL WITH GFR
ALT: 10 U/L (ref 0–44)
AST: 30 U/L (ref 15–41)
Albumin: 3.8 g/dL (ref 3.5–5.0)
Alkaline Phosphatase: 71 U/L (ref 38–126)
Anion gap: 13 (ref 5–15)
BUN: 46 mg/dL — ABNORMAL HIGH (ref 8–23)
CO2: 32 mmol/L (ref 22–32)
Calcium: 8.8 mg/dL — ABNORMAL LOW (ref 8.9–10.3)
Chloride: 97 mmol/L — ABNORMAL LOW (ref 98–111)
Creatinine, Ser: 1.26 mg/dL — ABNORMAL HIGH (ref 0.44–1.00)
GFR, Estimated: 42 mL/min — ABNORMAL LOW (ref >60)
Glucose, Bld: 81 mg/dL (ref 70–99)
Potassium: 4.4 mmol/L (ref 3.5–5.1)
Sodium: 141 mmol/L (ref 135–145)
Total Bilirubin: 0.5 mg/dL (ref 0.0–1.2)
Total Protein: 6.1 g/dL — ABNORMAL LOW (ref 6.5–8.1)

## 2024-06-15 LAB — BLOOD GAS, VENOUS
Acid-Base Excess: 13.5 mmol/L — ABNORMAL HIGH (ref 0.0–2.0)
Bicarbonate: 42 mmol/L — ABNORMAL HIGH (ref 20.0–28.0)
Drawn by: 56337
O2 Saturation: 81.8 %
Patient temperature: 37
pCO2, Ven: 71 mmHg (ref 44–60)
pH, Ven: 7.38 (ref 7.25–7.43)
pO2, Ven: 48 mmHg — ABNORMAL HIGH (ref 32–45)

## 2024-06-15 LAB — PHOSPHORUS: Phosphorus: 3 mg/dL (ref 2.5–4.6)

## 2024-06-15 LAB — RESP PANEL BY RT-PCR (RSV, FLU A&B, COVID)  RVPGX2
Influenza A by PCR: NEGATIVE
Influenza B by PCR: NEGATIVE
Resp Syncytial Virus by PCR: NEGATIVE
SARS Coronavirus 2 by RT PCR: POSITIVE — AB

## 2024-06-15 LAB — C-REACTIVE PROTEIN: CRP: 0.7 mg/dL (ref <1.0)

## 2024-06-15 LAB — PRO BRAIN NATRIURETIC PEPTIDE: Pro Brain Natriuretic Peptide: 10419 pg/mL — ABNORMAL HIGH (ref <300.0)

## 2024-06-15 LAB — PROCALCITONIN: Procalcitonin: 0.16 ng/mL

## 2024-06-15 LAB — MAGNESIUM: Magnesium: 2 mg/dL (ref 1.7–2.4)

## 2024-06-15 MED ORDER — SPIRONOLACTONE 12.5 MG HALF TABLET
12.5000 mg | ORAL_TABLET | Freq: Every day | ORAL | Status: DC
  Administered 2024-06-15 – 2024-06-16 (×2): 12.5 mg via ORAL
  Filled 2024-06-15 (×2): qty 1

## 2024-06-15 MED ORDER — ONDANSETRON HCL 4 MG/2ML IJ SOLN: 4.0000 mg | Freq: Four times a day (QID) | INTRAMUSCULAR | Status: DC | PRN

## 2024-06-15 MED ORDER — FAMOTIDINE 20 MG PO TABS: 20.0000 mg | ORAL_TABLET | Freq: Every day | ORAL | Status: DC

## 2024-06-15 MED ORDER — ARFORMOTEROL TARTRATE 15 MCG/2ML IN NEBU
15.0000 ug | INHALATION_SOLUTION | Freq: Two times a day (BID) | RESPIRATORY_TRACT | Status: DC
  Administered 2024-06-15 – 2024-06-16 (×2): 15 ug via RESPIRATORY_TRACT
  Filled 2024-06-15 (×3): qty 2

## 2024-06-15 MED ORDER — SPIRONOLACTONE 12.5 MG HALF TABLET
12.5000 mg | ORAL_TABLET | Freq: Every day | ORAL | Status: DC
  Filled 2024-06-15: qty 1

## 2024-06-15 MED ORDER — FUROSEMIDE 10 MG/ML IJ SOLN: 80.0000 mg | Freq: Two times a day (BID) | INTRAMUSCULAR | Status: DC

## 2024-06-15 MED ORDER — DILTIAZEM HCL 30 MG PO TABS
30.0000 mg | ORAL_TABLET | Freq: Two times a day (BID) | ORAL | Status: DC
Start: 2024-06-15 — End: 2024-06-16
  Administered 2024-06-15 – 2024-06-16 (×3): 30 mg via ORAL
  Filled 2024-06-15 (×3): qty 1

## 2024-06-15 MED ORDER — TRAZODONE HCL 50 MG PO TABS: 25.0000 mg | ORAL_TABLET | Freq: Every evening | ORAL | Status: DC | PRN

## 2024-06-15 MED ORDER — FUROSEMIDE 10 MG/ML IJ SOLN
80.0000 mg | Freq: Once | INTRAMUSCULAR | Status: AC
  Administered 2024-06-15: 80 mg via INTRAVENOUS
  Filled 2024-06-15: qty 8

## 2024-06-15 MED ORDER — FUROSEMIDE 10 MG/ML IJ SOLN
80.0000 mg | Freq: Every day | INTRAMUSCULAR | Status: DC
  Administered 2024-06-15 – 2024-06-16 (×2): 80 mg via INTRAVENOUS
  Filled 2024-06-15 (×3): qty 8

## 2024-06-15 MED ORDER — SUCRALFATE 1 GM/10ML PO SUSP: 1.0000 g | Freq: Four times a day (QID) | ORAL | Status: DC | PRN

## 2024-06-15 MED ORDER — GUAIFENESIN ER 600 MG PO TB12
600.0000 mg | ORAL_TABLET | Freq: Two times a day (BID) | ORAL | Status: DC
  Administered 2024-06-15 – 2024-06-16 (×3): 600 mg via ORAL
  Filled 2024-06-15 (×3): qty 1

## 2024-06-15 MED ORDER — MELATONIN 3 MG PO TABS
3.0000 mg | ORAL_TABLET | Freq: Every evening | ORAL | Status: DC | PRN
  Administered 2024-06-15: 3 mg via ORAL
  Filled 2024-06-15: qty 1

## 2024-06-15 MED ORDER — BENZONATATE 100 MG PO CAPS: 100.0000 mg | ORAL_CAPSULE | Freq: Three times a day (TID) | ORAL | Status: DC | PRN

## 2024-06-15 MED ORDER — ACETAMINOPHEN 325 MG PO TABS
650.0000 mg | ORAL_TABLET | Freq: Four times a day (QID) | ORAL | Status: DC | PRN
  Filled 2024-06-15: qty 2

## 2024-06-15 MED ORDER — DOCUSATE SODIUM 100 MG PO CAPS
100.0000 mg | ORAL_CAPSULE | Freq: Every day | ORAL | Status: DC
  Administered 2024-06-15 – 2024-06-16 (×2): 100 mg via ORAL
  Filled 2024-06-15 (×2): qty 1

## 2024-06-15 MED ORDER — SODIUM CHLORIDE 0.9 % IV SOLN
100.0000 mg | Freq: Every day | INTRAVENOUS | Status: DC
  Administered 2024-06-16: 100 mg via INTRAVENOUS
  Filled 2024-06-15: qty 20

## 2024-06-15 MED ORDER — HEPARIN SODIUM (PORCINE) 5000 UNIT/ML IJ SOLN
5000.0000 [IU] | Freq: Three times a day (TID) | INTRAMUSCULAR | Status: DC
  Administered 2024-06-15 – 2024-06-16 (×3): 5000 [IU] via SUBCUTANEOUS
  Filled 2024-06-15 (×3): qty 1

## 2024-06-15 MED ORDER — LATANOPROST 0.005 % OP SOLN
1.0000 [drp] | Freq: Every day | OPHTHALMIC | Status: DC
  Administered 2024-06-15: 1 [drp] via OPHTHALMIC
  Filled 2024-06-15: qty 2.5

## 2024-06-15 MED ORDER — ACETAMINOPHEN 650 MG RE SUPP: 650.0000 mg | Freq: Four times a day (QID) | RECTAL | Status: DC | PRN

## 2024-06-15 MED ORDER — FAMOTIDINE 20 MG PO TABS
10.0000 mg | ORAL_TABLET | Freq: Every day | ORAL | Status: DC
  Administered 2024-06-15: 10 mg via ORAL
  Filled 2024-06-15: qty 1

## 2024-06-15 MED ORDER — FLUTICASONE PROPIONATE 50 MCG/ACT NA SUSP: 1.0000 | Freq: Every day | NASAL | Status: DC | PRN

## 2024-06-15 MED ORDER — BENZONATATE 100 MG PO CAPS: 200.0000 mg | ORAL_CAPSULE | Freq: Three times a day (TID) | ORAL | Status: DC | PRN

## 2024-06-15 MED ORDER — DEXAMETHASONE 4 MG PO TABS
6.0000 mg | ORAL_TABLET | ORAL | Status: DC
  Administered 2024-06-15: 6 mg via ORAL
  Filled 2024-06-15: qty 1

## 2024-06-15 MED ORDER — SODIUM CHLORIDE 0.9 % IV SOLN
200.0000 mg | Freq: Once | INTRAVENOUS | Status: AC
  Administered 2024-06-15: 200 mg via INTRAVENOUS
  Filled 2024-06-15: qty 40

## 2024-06-15 MED ORDER — ALBUTEROL SULFATE (2.5 MG/3ML) 0.083% IN NEBU: 2.5000 mg | INHALATION_SOLUTION | RESPIRATORY_TRACT | Status: DC | PRN

## 2024-06-15 NOTE — Consult Note (Addendum)
 Cardiology Consultation   Patient ID: [name] MRN: [id number]; DOB: 03-10-1936  Admit date: 06/14/2024 Date of Consult: 06/15/2024  PCP:  Rollene Almarie LABOR, MD   [facility] Providers Cardiologist:  Redell Shallow, MD     Patient Profile: Paula Ward is a 88 y.o. female with a hx of interstitial lung disease chronically on 5 L via nasal cannula, CKD stage IIIb, chronic diastolic heart failure, moderate aortic stenosis, orthostatic hypotension, GERD, scleroderma, history of GI bleed secondary to AVM which required IVC filter placement, history of breast cancer, chronic anemia, pulmonary artery hypertension who is being seen 06/15/2024 for the evaluation of CHF at the request of Dr. Zella.  History of Present Illness: Paula Ward is an 88 year old female with above medical history who is followed by Dr. Shallow.  Patient previously had echocardiogram completed 02/2022 that showed EF 55-60%, no regional wall motion abnormality, moderate LVH, grade 1 DD, normal RV systolic function.  There was a large pericardial effusion without evidence of tamponade.  Also noted moderate AS.  She underwent right and left heart catheterization in 04/2022 that showed ectatic but patent coronary arteries with no obstructive disease, mild pulmonary hypertension, moderate aortic stenosis.  As her aortic stenosis was moderate at that time, she was not felt to need TAVR.  Has been monitored with serial echoes.  In regards to her pericardial effusion, patient reports that she has had a pericardial effusion for 20+ years.  Patient's most recent echocardiogram was completed 12/03/2023.  Showed EF 70-75%, no regional wall motion abnormalities, severe LVH, grade 1 DD, elevated LVEDP, normal RV systolic function.  Large pericardial effusion, mild mitral valve regurgitation, moderate tricuspid valve regurgitation, moderate aortic valve stenosis.  She was encouraged to go to the ED based on these echo  results.  When seen by cardiology on 12/04/2023, patient reported having occasional dyspnea but overall felt great.  Told Dr. Verlin that she would never let anyone attempt to drain the fluid from around her heart and would not consider a pericardial window.  She was ultimately able to be discharged from the ER.  Patient was admitted in 03/2024 with acute on chronic respiratory failure with hypoxia and hypercarbia.  She had presented secondary to low hemoglobin, elevated CO2 on BMP with associated confusion.  She was started on BiPAP with improvement in symptoms.  Palliative care was consulted and ultimately decision was made for patient to discharge home with home hospice.  Patient discontinued hospice that she started to do better.  Patient was seen in the cardiology clinic on 06/14/24.  At that time, patient reported required nasal cannula oxygen  use.  Also reported increased confusion and shortness of breath.  On exam she had bilateral lower extremity swelling.  Patient was encouraged to go to the ER for BiPAP, IV diuresis.  Suspected patient will need palliative care and hospice care consults.  After patient was seen in the cardiology office, she was seen by her primary care provider and tested positive for COVID.  Patient did present to the ED late on 8/26.  Labs in the ED significant for COVID-positive.  proBNP 10,419.  Chest x-ray with cardiomegaly, moderate severity interstitial edema.,  Hemoglobin 8.5.  She was admitted to the internal medicine service and started on IV Lasix  80 mg daily.  On interview, patient reports that her breathing has been a bit worse than usual for the past week or so. Also has been coughing more than usual and has had mild lower extremity  swelling. She notes that she can never lay flat on her back due to shortness of breath, so unsure if she has been having any orthopnea. Denies chest pain, palpitations, dizziness, syncope, near syncope, abdominal distention.   Past  Medical History:  Diagnosis Date   Anemia    Angiodysplasia of stomach and duodenum    Aortic stenosis    Arthus phenomenon    AVM (arteriovenous malformation)    Blood transfusion without reported diagnosis    Breast cancer (HCC) 1989   Left   Candida esophagitis (HCC)    Cataract    Chronic respiratory failure (HCC)    CKD stage 3b, GFR 30-44 ml/min (HCC)    Corneal edema    Corneal epithelial basement membrane dystrophy    CREST syndrome (HCC)    GERD (gastroesophageal reflux disease)    w/ HH   Interstitial lung disease (HCC)    Nodule of kidney    Pericardial effusion    PONV (postoperative nausea and vomiting)    Pulmonary embolus (HCC) 2003   Pulmonary hypertension (HCC)    Rectal incontinence    Renal cell carcinoma (HCC)    Scleroderma (HCC)    Tubular adenoma of colon    Uterine polyp     Past Surgical History:  Procedure Laterality Date   APPENDECTOMY  1962   BREAST LUMPECTOMY  1989   left   CATARACT EXTRACTION, BILATERAL Bilateral 12/2013   COLONOSCOPY WITH PROPOFOL  N/A 04/15/2021   Procedure: COLONOSCOPY WITH PROPOFOL ;  Surgeon: Teressa Toribio SQUIBB, MD;  Location: [facility];  Service: Endoscopy;  Laterality: N/A;   ENTEROSCOPY N/A 01/18/2016   Procedure: ENTEROSCOPY;  Surgeon: Gustav Shila GAILS, MD;  Location: [facility];  Service: Endoscopy;  Laterality: N/A;   ENTEROSCOPY N/A 05/24/2018   Procedure: ENTEROSCOPY;  Surgeon: Charlanne Groom, MD;  Location: [facility];  Service: Endoscopy;  Laterality: N/A;   ENTEROSCOPY N/A 03/29/2021   Procedure: ENTEROSCOPY;  Surgeon: Albertus Gordy HERO, MD;  Location: [facility];  Service: Gastroenterology;  Laterality: N/A;   ENTEROSCOPY N/A 04/13/2021   Procedure: ENTEROSCOPY;  Surgeon: Leigh Elspeth SQUIBB, MD;  Location: [facility];  Service: Gastroenterology;  Laterality: N/A;   ESOPHAGOGASTRODUODENOSCOPY (EGD) WITH PROPOFOL  N/A 12/21/2015   Procedure: ESOPHAGOGASTRODUODENOSCOPY (EGD) WITH PROPOFOL ;  Surgeon: Norleen LOISE Kiang, MD;  Location: [facility];  Service: Endoscopy;  Laterality: N/A;   HOT HEMOSTASIS N/A 05/24/2018   Procedure: HOT HEMOSTASIS (ARGON PLASMA COAGULATION/BICAP);  Surgeon: Charlanne Groom, MD;  Location: [facility];  Service: Endoscopy;  Laterality: N/A;   HOT HEMOSTASIS N/A 03/29/2021   Procedure: HOT HEMOSTASIS (ARGON PLASMA COAGULATION/BICAP);  Surgeon: Albertus Gordy HERO, MD;  Location: [facility];  Service: Gastroenterology;  Laterality: N/A;   HOT HEMOSTASIS N/A 04/13/2021   Procedure: HOT HEMOSTASIS (ARGON PLASMA COAGULATION/BICAP);  Surgeon: Leigh Elspeth SQUIBB, MD;  Location: [facility];  Service: Gastroenterology;  Laterality: N/A;   INTRAMEDULLARY (IM) NAIL INTERTROCHANTERIC Left 10/11/2022   Procedure: INTRAMEDULLARY (IM) NAIL INTERTROCHANTERIC;  Surgeon: Melodi Lerner, MD;  Location: [facility];  Service: Orthopedics;  Laterality: Left;   IR GENERIC HISTORICAL  06/05/2016   IR RADIOLOGIST EVAL & MGMT 06/05/2016 Marcey Moan, MD [facility] INTERV RAD   IVC Filter     KIDNEY SURGERY     left -laser surgery by Dr. moan- 5 yrs ago no removal   RIGHT/LEFT HEART CATH AND CORONARY ANGIOGRAPHY N/A 04/25/2022   Procedure: RIGHT/LEFT HEART CATH AND CORONARY ANGIOGRAPHY;  Surgeon: Wonda Sharper, MD;  Location: [facility] INVASIVE CV LAB;  Service: Cardiovascular;  Laterality: N/A;   SCHLEROTHERAPY  05/24/2018   Procedure: WALDEMAR;  Surgeon: Charlanne Groom, MD;  Location: [facility];  Service: Endoscopy;;   SUBMUCOSAL TATTOO INJECTION  04/13/2021   Procedure: SUBMUCOSAL TATTOO INJECTION;  Surgeon: Leigh Elspeth SQUIBB, MD;  Location: [facility];  Service: Gastroenterology;;   TONSILLECTOMY     TUBAL LIGATION       Home Medications:  Prior to Admission medications   Medication Sig Start Date End Date Taking? Authorizing Provider  acetaminophen  (TYLENOL ) 325 MG tablet Take 2 tablets (650 mg total) by mouth every 6 (six) hours as needed for moderate pain. 07/18/22  Yes Christopher Savannah, PA-C   albuterol  (PROVENTIL ) (2.5 MG/3ML) 0.083% nebulizer solution Take 3 mLs (2.5 mg total) by nebulization every 6 (six) hours as needed for wheezing or shortness of breath. 09/03/23  Yes Pokhrel, Laxman, MD  albuterol  (VENTOLIN  HFA) 108 (90 Base) MCG/ACT inhaler INHALE 2 PUFFS INTO THE LUNGS EVERY 6 HOURS AS NEEDED FOR WHEEZING OR SHORTNESS OF BREATH 03/13/23  Yes Rollene Almarie LABOR, MD  arformoterol  (BROVANA ) 15 MCG/2ML NEBU Take 2 mLs (15 mcg total) by nebulization 2 (two) times daily. 11/11/23  Yes Hunsucker, Donnice SAUNDERS, MD  augmented betamethasone dipropionate (DIPROLENE-AF) 0.05 % cream Apply 1 application  topically 2 (two) times daily as needed (for irritation). 07/13/23  Yes [provider]  diltiazem  (CARDIZEM ) 30 MG tablet Take 1 tablet (30 mg total) by mouth every 12 (twelve) hours. 12/04/23  Yes Cleaver, Josefa HERO, NP  docusate sodium  (COLACE) 100 MG capsule Take 100 mg by mouth daily.   Yes [provider]  famotidine  (PEPCID ) 20 MG tablet TAKE 1 TABLET BY MOUTH AT BEDTIME 06/06/24  Yes Nandigam, Kavitha V, MD  fluticasone  (FLONASE ) 50 MCG/ACT nasal spray USE 1 SPRAY(S) IN EACH NOSTRIL ONCE DAILY AS NEEDED FOR ALLERGIES 12/04/23  Yes Rollene Almarie LABOR, MD  furosemide  (LASIX ) 20 MG tablet Take 2 tablets ( 40 mg ) every morning may take extra 2 tablets for weight gain of 3 lbs 02/19/24  Yes Pietro Redell RAMAN, MD  ipratropium (ATROVENT ) 0.02 % nebulizer solution Take 2.5 mLs (0.5 mg total) by nebulization 2 (two) times daily. 06/06/24  Yes Hunsucker, Donnice SAUNDERS, MD  latanoprost  (XALATAN ) 0.005 % ophthalmic solution Place 1 drop into both eyes at bedtime. 09/23/22  Yes [provider]  Multiple Vitamin (MULTIVITAMIN WITH MINERALS) TABS tablet Take 1 tablet by mouth daily.   Yes [provider]  Nutritional Supplements (ENSURE HIGH PROTEIN) LIQD Take 0.5 Bottles by mouth daily as needed (for supplementation).   Yes [provider]  OXYGEN  Inhale 6 L/min into  the lungs continuous. Patient taking differently: Inhale 6 L/min into the lungs continuous.   Yes [provider]  spironolactone  (ALDACTONE ) 25 MG tablet Take 1/2 tablet ( 12.5 mg ) every morning 02/18/24  Yes Crenshaw, Redell RAMAN, MD  sucralfate  (CARAFATE ) 1 GM/10ML suspension TAKE 10 MLS BY MOUTH EVERY 6 HOURS AS NEEDED FOR REFLUX 04/18/24  Yes Nandigam, Kavitha V, MD  triamcinolone  cream (KENALOG ) 0.1 % Apply 1 Application topically See admin instructions. Apply to affected leg(s) after showers and up to 2 additional times a day as needed for irritation 07/07/23  Yes [provider]  clobetasol ointment (TEMOVATE) 0.05 % Apply 1 Application topically 2 (two) times daily as needed (for irritation). Patient not taking: Reported on 06/15/2024 04/20/23   [provider]  nirmatrelvir /ritonavir , renal dosing, (PAXLOVID ) 10 x 150 MG & 10 x 100MG   TABS Take 2 tablets by mouth 2 (two) times daily for 5 days. (Take nirmatrelvir  150 mg one tablet twice daily for 5 days and ritonavir  100 mg one tablet twice daily for 5 days) Patient GFR is 34 06/14/24 06/19/24  [name], FNP  revefenacin  (YUPELRI ) 175 MCG/3ML nebulizer solution Take 3 mLs (175 mcg total) by nebulization daily. Patient not taking: Reported on 06/15/2024 05/16/24   Hunsucker, Donnice SAUNDERS, MD    Scheduled Meds:  arformoterol   15 mcg Nebulization BID   diltiazem   30 mg Oral Q12H   docusate sodium   100 mg Oral Daily   famotidine   10 mg Oral QHS   furosemide   80 mg Intravenous Daily   guaiFENesin   600 mg Oral BID   heparin   5,000 Units Subcutaneous Q8H   latanoprost   1 drop Both Eyes QHS   spironolactone   12.5 mg Oral Daily   Continuous Infusions:  PRN Meds: acetaminophen  **OR** acetaminophen , albuterol , benzonatate , fluticasone , melatonin, ondansetron  (ZOFRAN ) IV, sucralfate , traZODone   Allergies:    Allergies  Allergen Reactions   Codeine  Nausea Only    Hallucinations, too   Other Nausea And Vomiting and  Other (See Comments)    -mycin antibiotics.   Also cause hallucinations.   Erythromycin Nausea And Vomiting   Iodinated Contrast Media Other (See Comments)    Renal issues   Lisinopril Other (See Comments)    Pt doesn't remember reaction   Mirtazapine  Other (See Comments)    Threw me into orbit   Mycophenolate Mofetil Nausea Only    Made me nervous   Sulfa  Antibiotics Other (See Comments)    Unknown reaction    Social History:   Social History   Socioeconomic History   Marital status: Married    Spouse name: Not on file   Number of children: 2   Years of education: Not on file   Highest education level: Not on file  Occupational History   Occupation: Retired  Tobacco Use   Smoking status: Never   Smokeless tobacco: Never  Vaping Use   Vaping status: Never Used  Substance and Sexual Activity   Alcohol use: No   Drug use: No   Sexual activity: Not Currently  Other Topics Concern   Not on file  Social History Narrative   Married '611 son- '65, 1 daughter '63; 6 children (2 adopted)SO- SOBRetirement- doing well. Marriage in good health. Patient has never smoked. Alcohol use- noPt gets regular exercise   Social Drivers of Health   Financial Resource Strain: Low Risk  (04/02/2022)   Overall Financial Resource Strain (CARDIA)    Difficulty of Paying Living Expenses: Not hard at all  Food Insecurity: No Food Insecurity (03/24/2024)   Hunger Vital Sign    Worried About Running Out of Food in the Last Year: Never true    Ran Out of Food in the Last Year: Never true  Transportation Needs: No Transportation Needs (03/24/2024)   PRAPARE - Administrator, Civil Service (Medical): No    Lack of Transportation (Non-Medical): No  Physical Activity: Inactive (04/02/2022)   Exercise Vital Sign    Days of Exercise per Week: 0 days    Minutes of Exercise per Session: 0 min  Stress: No Stress Concern Present (04/02/2022)   [employer] of Occupational Health -  Occupational Stress Questionnaire    Feeling of Stress : Not at all  Social Connections: Socially Integrated (03/24/2024)   Social Connection and Isolation Panel    Frequency  of Communication with Friends and Family: More than three times a week    Frequency of Social Gatherings with Friends and Family: More than three times a week    Attends Religious Services: More than 4 times per year    Active Member of [employer] or Organizations: Yes    Attends Engineer, structural: More than 4 times per year    Marital Status: Married  Catering manager Violence: Not At Risk (03/24/2024)   Humiliation, Afraid, Rape, and Kick questionnaire    Fear of Current or Ex-Partner: No    Emotionally Abused: No    Physically Abused: No    Sexually Abused: No    Family History:   Family History  Problem Relation Age of Onset   Bladder Cancer Father    Diabetes Father    Prostate cancer Father    Alzheimer's disease Mother    Diabetes Sister    Lung cancer Sister         smoker   Esophageal cancer Paternal Uncle    Colon cancer Neg Hx      ROS:  Please see the history of present illness.  All other ROS reviewed and negative.     Physical Exam/Data: Vitals:   06/15/24 0119 06/15/24 0418 06/15/24 0645 06/15/24 0740  BP: (!) 149/65     Pulse: 86  81 80  Resp: 19  17 19   Temp:  98 F (36.7 C)    TempSrc:  Oral    SpO2: 99%  100% 97%   No intake or output data in the 24 hours ending 06/15/24 1019    06/14/2024    3:42 PM 06/14/2024    1:51 PM 05/19/2024    3:46 PM  Last 3 Weights  Weight (lbs) 102 lb 102 lb 12.8 oz 103 lb 14.4 oz  Weight (kg) 46.267 kg 46.63 kg 47.129 kg     There is no height or weight on file to calculate BMI.  General:  Well nourished, well developed, in no acute distress. Laying in bed with head elevated  HEENT: normal Neck: no JVD Vascular: Radial pulses 2+ bilaterally Cardiac:  normal S1, S2; RRR; grade 2/6 systolic murmur  Lungs:  crackles throughout. Normal WOB  on [place]  Abd: soft, nontender Ext: 1+ edema in BLE Musculoskeletal:  No deformities  Skin: warm and dry  Neuro:  CNs 2-12 intact, no focal abnormalities noted Psych:  Normal affect   EKG:  The EKG was personally reviewed and demonstrates:  Sinus rhythm with HR 80 BPM, baseline wanders.  Telemetry:  Telemetry was personally reviewed and demonstrates:  NSR   Relevant CV Studies: Cardiac Studies & Procedures   ______________________________________________________________________________________________ CARDIAC CATHETERIZATION  CARDIAC CATHETERIZATION 04/25/2022  Conclusion 1.  Ectatic but patent coronary arteries with no obstructive disease.  Right dominant coronary artery. 2.  Mild pulmonary hypertension with PA pressure 52/15, mean 28 mmHg, transpulmonary gradient 10 mmHg, PVR approximately 2 Wood units. 3.  Moderate aortic stenosis with mean transvalvular gradient 16 mmHg calculated aortic valve area 1.16 cm 4.  Heavy mitral annular calcification by plain fluoroscopy 5.  Mild aortic valve calcification by plain fluoroscopy  Recommend: Continued medical therapy, clinical follow-up, echo surveillance.  The patient does not appear to have severe aortic stenosis and I do not think she would benefit from TAVR at this time.  Findings Coronary Findings Diagnostic  Dominance: Right  Left Main Large, ectatic vessel without stenosis.  Divides into the LAD and left circumflex.  Left  Anterior Descending The vessel exhibits minimal luminal irregularities. The LAD is patent to the apex.  The diagonal branches are patent.  The vessel is difficult to fill because of brisk flow.  Left Circumflex The vessel exhibits minimal luminal irregularities. Large vessel with no obstructive disease.  Right Coronary Artery Vessel is large. The vessel exhibits minimal luminal irregularities. Large, dominant RCA.  No obstructive disease.  PDA and PLA branches are patent.  Intervention  No interventions  have been documented.     ECHOCARDIOGRAM  ECHOCARDIOGRAM COMPLETE 12/03/2023  Narrative ECHOCARDIOGRAM REPORT    Patient Name:   AMABEL STMARIE Date of Exam: 12/03/2023 Medical Rec #:  [id number]       Height:       63.0 in Accession #:    [id number]      Weight:       101.0 lb Date of Birth:  1936/03/08       BSA:          1.446 m Patient Age:    87 years        BP:           106/57 mmHg Patient Gender: F               HR:           94 bpm. Exam Location:  [address]  Procedure: 2D Echo, Cardiac Doppler and Color Doppler (Both Spectral and Color Flow Doppler were utilized during procedure).  Indications:    I31.3 Pericardial Effusion  History:        Patient has prior history of Echocardiogram examinations. Pulmonary HTN, Aortic Valve Disease; Risk Factors:Hypertension.  Sonographer:    Waldo Guadalajara RCS Referring Phys: [id number] BRIAN S CRENSHAW  IMPRESSIONS   1. Left ventricular ejection fraction, by estimation, is 70 to 75%. The left ventricle has hyperdynamic function. The left ventricle has no regional wall motion abnormalities. There is severe concentric left ventricular hypertrophy. Left ventricular diastolic parameters are consistent with Grade I diastolic dysfunction (impaired relaxation). Elevated left ventricular end-diastolic pressure. 2. Right ventricular systolic function is normal. The right ventricular size is normal. 3. There effusion measures 5.06cm at largest diameter posteriorly and 2.29cm anteriorly.. Large pericardial effusion. The pericardial effusion is circumferential. 4. The mitral valve is normal in structure. Mild mitral valve regurgitation. Mild to moderate mitral stenosis. The mean mitral valve gradient is 6.0 mmHg. 5. Tricuspid valve regurgitation is moderate. 6. The aortic valve is tricuspid. There is moderate calcification of the aortic valve. There is moderate thickening of the aortic valve. Aortic valve regurgitation is not visualized. Moderate  aortic valve stenosis. Aortic valve area, by VTI measures 1.17 cm. Aortic valve mean gradient measures 21.0 mmHg. Aortic valve Vmax measures 3.14 m/s. 7. Compared to study dated 01/15/23 the pericardial effusion has increased in diameter posteriorly (4cm prior andnow 5cm) and there is now a 40mm respirophasic change in MV inflow velocity (2mm on prior echo). There is no overt diastolic RV collapse, but both the RV and LV are very underfilled. There is also now moderate AS. 8. Case discussed with Dr. Pietro who recommended patient come in to office 2/14 for DOD visit if asymptomatic today.  FINDINGS Left Ventricle: Left ventricular ejection fraction, by estimation, is 70 to 75%. The left ventricle has hyperdynamic function. The left ventricle has no regional wall motion abnormalities. Strain imaging was not performed. The left ventricular internal cavity size was normal in size. There is severe concentric left ventricular hypertrophy. Left  ventricular diastolic parameters are consistent with Grade I diastolic dysfunction (impaired relaxation). Elevated left ventricular end-diastolic pressure.  Right Ventricle: The right ventricular size is normal. No increase in right ventricular wall thickness. Right ventricular systolic function is normal.  Left Atrium: Left atrial size was normal in size.  Right Atrium: Right atrial size was normal in size.  Pericardium: There effusion measures 5.06cm at largest diameter posteriorly and 2.29cm anteriorly. A large pericardial effusion is present. The pericardial effusion is circumferential. There is diastolic collapse of the right atrial wall, diastolic collapse of the right ventricular free wall and excessive respiratory variation in the mitral valve spectral Doppler velocities.  Mitral Valve: The mitral valve is normal in structure. Mild mitral valve regurgitation. Mild to moderate mitral valve stenosis. MV peak gradient, 12.1 mmHg. The mean mitral valve  gradient is 6.0 mmHg.  Tricuspid Valve: The tricuspid valve is normal in structure. Tricuspid valve regurgitation is moderate . No evidence of tricuspid stenosis.  Aortic Valve: The aortic valve is tricuspid. There is moderate calcification of the aortic valve. There is moderate thickening of the aortic valve. Aortic valve regurgitation is not visualized. Moderate aortic stenosis is present. Aortic valve mean gradient measures 21.0 mmHg. Aortic valve peak gradient measures 39.4 mmHg. Aortic valve area, by VTI measures 1.17 cm.  Pulmonic Valve: The pulmonic valve was normal in structure. Pulmonic valve regurgitation is mild. No evidence of pulmonic stenosis.  Aorta: The aortic root is normal in size and structure.  Venous: The inferior vena cava was not well visualized.  IAS/Shunts: No atrial level shunt detected by color flow Doppler.  Additional Comments: 3D imaging was not performed.   LEFT VENTRICLE PLAX 2D LVIDd:         2.70 cm   Diastology LVIDs:         2.00 cm   LV e' medial:    3.15 cm/s LV PW:         1.80 cm   LV E/e' medial:  37.5 LV IVS:        1.30 cm   LV e' lateral:   7.51 cm/s LVOT diam:     1.90 cm   LV E/e' lateral: 15.7 LV SV:         64 LV SV Index:   44 LVOT Area:     2.84 cm   RIGHT VENTRICLE RV Basal diam:  3.00 cm RV S prime:     17.20 cm/s TAPSE (M-mode): 1.8 cm RVSP:           66.4 mmHg  LEFT ATRIUM              Index        RIGHT ATRIUM           Index LA diam:        3.90 cm  2.70 cm/m   RA Pressure: 3.00 mmHg LA Vol (A2C):   99.0 ml  68.45 ml/m  RA Area:     14.70 cm LA Vol (A4C):   95.2 ml  65.82 ml/m  RA Volume:   38.50 ml  26.62 ml/m LA Biplane Vol: 101.0 ml 69.83 ml/m AORTIC VALVE AV Area (Vmax):    1.08 cm AV Area (Vmean):   1.05 cm AV Area (VTI):     1.17 cm AV Vmax:           314.00 cm/s AV Vmean:          210.000 cm/s AV VTI:  0.547 m AV Peak Grad:      39.4 mmHg AV Mean Grad:      21.0 mmHg LVOT Vmax:          120.00 cm/s LVOT Vmean:        78.100 cm/s LVOT VTI:          0.226 m LVOT/AV VTI ratio: 0.41  AORTA Ao Root diam: 3.10 cm Ao Asc diam:  3.10 cm  MITRAL VALVE                TRICUSPID VALVE MV Area (PHT): 5.34 cm     TR Peak grad:   63.4 mmHg MV Area VTI:   1.57 cm     TR Vmax:        398.00 cm/s MV Peak grad:  12.1 mmHg    Estimated RAP:  3.00 mmHg MV Mean grad:  6.0 mmHg     RVSP:           66.4 mmHg MV Vmax:       1.74 m/s MV Vmean:      119.0 cm/s   SHUNTS MV Decel Time: 142 msec     Systemic VTI:  0.23 m MV E velocity: 118.00 cm/s  Systemic Diam: 1.90 cm MV A velocity: 167.00 cm/s MV E/A ratio:  0.71  Wilbert Bihari MD Electronically signed by Wilbert Bihari MD Signature Date/Time: 12/03/2023/8:22:40 PM    Final          ______________________________________________________________________________________________       Laboratory Data: High Sensitivity Troponin:  No results for input(s): TROPONINIHS in the last 720 hours.   Chemistry Recent Labs  Lab 06/15/24 0003 06/15/24 0010 06/15/24 0422  NA 142 140 141  K 4.5 4.3 4.4  CL 99 93* 97*  CO2 32  --  32  GLUCOSE 95 96 81  BUN 53* 55* 46*  CREATININE 1.37* 1.50* 1.26*  CALCIUM  8.8*  --  8.8*  MG  --   --  2.0  GFRNONAA 38*  --  42*  ANIONGAP 11  --  13    Recent Labs  Lab 06/15/24 0422  PROT 6.1*  ALBUMIN 3.8  AST 30  ALT 10  ALKPHOS 71  BILITOT 0.5   Lipids No results for input(s): CHOL, TRIG, HDL, LABVLDL, LDLCALC, CHOLHDL in the last 168 hours.  Hematology Recent Labs  Lab 06/15/24 0003 06/15/24 0010 06/15/24 0422  WBC 4.0  --  3.8*  RBC 2.45*  --  2.41*  HGB 8.5* 8.5* 8.6*  HCT 28.9* 25.0* 27.9*  MCV 118.0*  --  115.8*  MCH 34.7*  --  35.7*  MCHC 29.4*  --  30.8  RDW 12.8  --  12.8  PLT 142*  --  142*   Thyroid  No results for input(s): TSH, FREET4 in the last 168 hours.  BNP Recent Labs  Lab 06/15/24 0003  PROBNP 10,419.0*    DDimer No results for  input(s): DDIMER in the last 168 hours.  Radiology/Studies:  DG Chest 2 View Result Date: 06/15/2024 CLINICAL DATA:  COVID positive with elevated CO2 levels EXAM: CHEST - 2 VIEW COMPARISON:  March 23, 2024 FINDINGS: The cardiac silhouette is markedly enlarged and unchanged in size. Moderate severity diffusely increased interstitial lung markings are noted. This is increased in severity when compared to the prior study. A left pleural effusion is suspected. No pneumothorax is identified. Extensive breast attenuation artifact is seen overlying the bilateral lung bases. Radiopaque surgical clips are present along  the left axilla. Multilevel degenerative changes are seen throughout the thoracic spine. IMPRESSION: 1. Cardiomegaly with moderate severity interstitial edema. 2. Left basilar atelectasis and/or infiltrate with a left pleural effusion. Electronically Signed   By: Suzen Dials M.D.   On: 06/15/2024 00:52     Assessment and Plan:  Acute on chronic diastolic heart failure - Most recent echocardiogram from 11/2023 showed EF 70-75%, no regional wall motion abnormalities, severe LVH, grade I DD, normal RV systolic function  - Patient has chronic shortness of breath due to interstitial lung disease. Her breathing had been worse than usual and she needed increased oxygen  supplementation for the past week or so. In the ED, pro-BNP 10,419. CXR showed cardiomegaly with moderate severity interstitial edema  - Continues to have 1+ edema in BLE on exam  - Agree with IV lasix  80 mg daily. Monitor strict I/Os, daily weights, renal function. Creatinine 1.37>1.26 today  - Continue spironolactone  12.5 mg daily   Mitral regurgitation  Mitral Stenosis  Aortic Stenosis  - Echocardiogram from 11/2023 showed mild MR, mild-moderate MS, moderate TR, moderate aortic valve stenosis  - Patient denies chest pain, syncope, near syncope. Does have SOB and is receiving diuresis as above  - Patient DNR/DNI. Has been on  hospice care in the past. Palliative care seeing today. Very unlikely that any valvular interventions would align with patient's care goals. No plans for routine monitoring   Large, chronic pericardial effusion - Patient reports that she has had an effusion for >20 years. Has refused paracentesis or pericardial window in the past - Has been large on echo since at least 2017  - BP stable. No JVD on exam   Of note, patient is followed by palliative care as an outpatient. Had previously been on home hospice, but discontinued these services once she was feeling better.   Otherwise per primary - COVID-19 infection - CKD stage III - Anemia of chronic disease - Interstitial lung disease - pulmonary has been consulted    Risk Assessment/Risk Scores:   New [place]  Heart Association (NYHA) Functional Class NYHA Class II     For questions or updates, please contact [facility] Please consult [email or web address] for contact info under    Signed, Rollo FABIENE Louder, PA-C  06/15/2024 10:19 AM  I have seen and examined the patient along with Rollo FABIENE Louder, PA-C .  I have reviewed the chart, notes and new data.  I agree with PA/NP's note.  Key new complaints: Recent worsening shortness of breath and bilateral lower extremity edema in the setting of acute COVID infection Key examination changes: Mild JVD, bilateral 1+ lower extremity edema to just above the ankles.  Regular rate and rhythm, aortic ejection murmur at the right upper sternal border 2/6, holosystolic murmur at the left lower sternal border 2/6, no apical murmurs, no diastolic murmurs Key new findings / data: Reviewed echocardiogram for February which shows an impressively large but chronic pericardial effusion without overt echo findings to suggest tamponade.  There is moderate aortic stenosis due to calcific degeneration and there is also mild-moderate mitral stenosis primarily due to annular calcification.  There is moderate  pulmonary artery hypertension.  In normal sinus rhythm on ECG and telemetry.  ABG shows findings consistent with severe chronic hypoxemia and chronic hypercapnia with respiratory acidosis and metabolic compensation.  Chronic anemia with hemoglobin 8-9 range  PLAN: Acute decompensation of HFpEF (left heart failure due to diastolic dysfunction as well as moderate aortic stenosis and mitral stenosis; right  heart failure due to chronic cor pulmonale/pulmonary hypertension) in the setting of acute COVID-19 infection. She has a very large pericardial effusion, but this appears to be chronic and without clear evidence of hemodynamic consequences. Although her valvular problems independently are not severe, they have a cumulative effect to worsen left heart filling pressures.  This will be worsened by anemia and infection, with increased cardiac output requirements. Agree with IV diuretics, but monitor for worsening renal function and metabolic alkalosis.  Excessive metabolic alkalosis could lead to worsening hypercapnia and altered mental status. She is elderly, underweight and frail and prognosis is guarded.  Previously has been on hospice care, but has done reasonably well.  If she deteriorates further may need more aggressive intervention from the palliative care team, but for the time being seems to be holding her own.   Shyniece Scripter, MD, FACC [facility] [phone] 06/15/2024, 1:34 PM

## 2024-06-15 NOTE — H&P (Addendum)
 History and Physical  Paula Ward FMW:[id number] DOB: 08-15-1936 DOA: 06/14/2024  PCP: Rollene Almarie LABOR, MD   Chief Complaint: Shortness of breath, leg swelling  HPI: Paula Ward is a 88 y.o. female with medical history significant for CKD stage IIIb, chronic anemia, pulmonary arterial hypertension, interstitial lung disease chronically on 5 L nasal cannula oxygen  admitted to the hospital with concern for acute on chronic diastolic congestive heart failure.  She was hospitalized at [facility] in June 2025 with hypercarbia, confusion, and volume overload.  She improved with BiPAP and diuresis, was sent home with NIV and seems like she had been overall stable.  She was actually discharged from that hospital stay with home hospice, but discontinued hospice as she started to do better.  She has been followed as an outpatient by cardiology and pulmonology.  She had a routine follow-up with cardiology yesterday, they noted some mild lower extremity edema and sent her to the ER for IV diuresis, and further workup of her respiratory acidosis.  Patient denies any increased cough, shortness of breath, but states that she is short of breath all the time, always feels weak.  She is concerned about her anemia of chronic disease, she feels that she would do better if she was given some blood.  She also specifically denies any fevers or chills, productive cough or chest pain.  Review of Systems: Please see HPI for pertinent positives and negatives. A complete 10 system review of systems are otherwise negative.  Past Medical History:  Diagnosis Date   Anemia    Angiodysplasia of stomach and duodenum    Aortic stenosis    Arthus phenomenon    AVM (arteriovenous malformation)    Blood transfusion without reported diagnosis    Breast cancer (HCC) 1989   Left   Candida esophagitis (HCC)    Cataract    Chronic respiratory failure (HCC)    CKD stage 3b, GFR 30-44 ml/min (HCC)    Corneal edema    Corneal  epithelial basement membrane dystrophy    CREST syndrome (HCC)    GERD (gastroesophageal reflux disease)    w/ HH   Interstitial lung disease (HCC)    Nodule of kidney    Pericardial effusion    PONV (postoperative nausea and vomiting)    Pulmonary embolus (HCC) 2003   Pulmonary hypertension (HCC)    Rectal incontinence    Renal cell carcinoma (HCC)    Scleroderma (HCC)    Tubular adenoma of colon    Uterine polyp    Past Surgical History:  Procedure Laterality Date   APPENDECTOMY  1962   BREAST LUMPECTOMY  1989   left   CATARACT EXTRACTION, BILATERAL Bilateral 12/2013   COLONOSCOPY WITH PROPOFOL  N/A 04/15/2021   Procedure: COLONOSCOPY WITH PROPOFOL ;  Surgeon: Teressa Toribio SQUIBB, MD;  Location: [facility];  Service: Endoscopy;  Laterality: N/A;   ENTEROSCOPY N/A 01/18/2016   Procedure: ENTEROSCOPY;  Surgeon: Gustav Shila GAILS, MD;  Location: [facility];  Service: Endoscopy;  Laterality: N/A;   ENTEROSCOPY N/A 05/24/2018   Procedure: ENTEROSCOPY;  Surgeon: Charlanne Groom, MD;  Location: [facility];  Service: Endoscopy;  Laterality: N/A;   ENTEROSCOPY N/A 03/29/2021   Procedure: ENTEROSCOPY;  Surgeon: Albertus Gordy HERO, MD;  Location: [facility];  Service: Gastroenterology;  Laterality: N/A;   ENTEROSCOPY N/A 04/13/2021   Procedure: ENTEROSCOPY;  Surgeon: Leigh Elspeth SQUIBB, MD;  Location: [facility];  Service: Gastroenterology;  Laterality: N/A;   ESOPHAGOGASTRODUODENOSCOPY (EGD) WITH PROPOFOL  N/A 12/21/2015  Procedure: ESOPHAGOGASTRODUODENOSCOPY (EGD) WITH PROPOFOL ;  Surgeon: Norleen LOISE Kiang, MD;  Location: [facility];  Service: Endoscopy;  Laterality: N/A;   HOT HEMOSTASIS N/A 05/24/2018   Procedure: HOT HEMOSTASIS (ARGON PLASMA COAGULATION/BICAP);  Surgeon: Charlanne Groom, MD;  Location: [facility];  Service: Endoscopy;  Laterality: N/A;   HOT HEMOSTASIS N/A 03/29/2021   Procedure: HOT HEMOSTASIS (ARGON PLASMA COAGULATION/BICAP);  Surgeon: Albertus Gordy HERO, MD;  Location: [facility];   Service: Gastroenterology;  Laterality: N/A;   HOT HEMOSTASIS N/A 04/13/2021   Procedure: HOT HEMOSTASIS (ARGON PLASMA COAGULATION/BICAP);  Surgeon: Leigh Elspeth SQUIBB, MD;  Location: [facility];  Service: Gastroenterology;  Laterality: N/A;   INTRAMEDULLARY (IM) NAIL INTERTROCHANTERIC Left 10/11/2022   Procedure: INTRAMEDULLARY (IM) NAIL INTERTROCHANTERIC;  Surgeon: Melodi Lerner, MD;  Location: [facility];  Service: Orthopedics;  Laterality: Left;   IR GENERIC HISTORICAL  06/05/2016   IR RADIOLOGIST EVAL & MGMT 06/05/2016 Marcey Moan, MD [facility] INTERV RAD   IVC Filter     KIDNEY SURGERY     left -laser surgery by Dr. moan- 5 yrs ago no removal   RIGHT/LEFT HEART CATH AND CORONARY ANGIOGRAPHY N/A 04/25/2022   Procedure: RIGHT/LEFT HEART CATH AND CORONARY ANGIOGRAPHY;  Surgeon: Wonda Sharper, MD;  Location: [facility] CV [facility];  Service: Cardiovascular;  Laterality: N/A;   SCHLEROTHERAPY  05/24/2018   Procedure: WALDEMAR;  Surgeon: Charlanne Groom, MD;  Location: [facility];  Service: Endoscopy;;   SUBMUCOSAL TATTOO INJECTION  04/13/2021   Procedure: SUBMUCOSAL TATTOO INJECTION;  Surgeon: Leigh Elspeth SQUIBB, MD;  Location: [facility];  Service: Gastroenterology;;   TONSILLECTOMY     TUBAL LIGATION     Social History:  reports that she has never smoked. She has never used smokeless tobacco. She reports that she does not drink alcohol and does not use drugs.  Allergies  Allergen Reactions   Codeine  Nausea Only    Hallucinations, too   Other Nausea And Vomiting and Other (See Comments)    -mycin antibiotics.   Also cause hallucinations.   Erythromycin Nausea And Vomiting   Iodinated Contrast Media Other (See Comments)    Renal issues   Lisinopril Other (See Comments)    Pt doesn't remember reaction   Mirtazapine  Other (See Comments)    Threw me into orbit   Mycophenolate Mofetil Nausea Only    Made me nervous   Sulfa  Antibiotics Other (See Comments)    Unknown  reaction    Family History  Problem Relation Age of Onset   Bladder Cancer Father    Diabetes Father    Prostate cancer Father    Alzheimer's disease Mother    Diabetes Sister    Lung cancer Sister         smoker   Esophageal cancer Paternal Uncle    Colon cancer Neg Hx      Prior to Admission medications   Medication Sig Start Date End Date Taking? Authorizing Provider  acetaminophen  (TYLENOL ) 325 MG tablet Take 2 tablets (650 mg total) by mouth every 6 (six) hours as needed for moderate pain. 07/18/22  Yes Christopher Savannah, PA-C  albuterol  (PROVENTIL ) (2.5 MG/3ML) 0.083% nebulizer solution Take 3 mLs (2.5 mg total) by nebulization every 6 (six) hours as needed for wheezing or shortness of breath. 09/03/23  Yes Pokhrel, Laxman, MD  albuterol  (VENTOLIN  HFA) 108 (90 Base) MCG/ACT inhaler INHALE 2 PUFFS INTO THE LUNGS EVERY 6 HOURS AS NEEDED FOR WHEEZING OR SHORTNESS OF BREATH 03/13/23  Yes Rollene Almarie LABOR, MD  arformoterol  (  BROVANA ) 15 MCG/2ML NEBU Take 2 mLs (15 mcg total) by nebulization 2 (two) times daily. 11/11/23  Yes Hunsucker, Donnice SAUNDERS, MD  augmented betamethasone dipropionate (DIPROLENE-AF) 0.05 % cream Apply 1 application  topically 2 (two) times daily as needed (for irritation). 07/13/23  Yes [provider]  diltiazem  (CARDIZEM ) 30 MG tablet Take 1 tablet (30 mg total) by mouth every 12 (twelve) hours. 12/04/23  Yes Cleaver, Josefa HERO, NP  docusate sodium  (COLACE) 100 MG capsule Take 100 mg by mouth daily.   Yes [provider]  famotidine  (PEPCID ) 20 MG tablet TAKE 1 TABLET BY MOUTH AT BEDTIME 06/06/24  Yes Nandigam, Kavitha V, MD  fluticasone  (FLONASE ) 50 MCG/ACT nasal spray USE 1 SPRAY(S) IN EACH NOSTRIL ONCE DAILY AS NEEDED FOR ALLERGIES 12/04/23  Yes Rollene Almarie LABOR, MD  furosemide  (LASIX ) 20 MG tablet Take 2 tablets ( 40 mg ) every morning may take extra 2 tablets for weight gain of 3 lbs 02/19/24  Yes Pietro Redell RAMAN, MD  ipratropium (ATROVENT ) 0.02 %  nebulizer solution Take 2.5 mLs (0.5 mg total) by nebulization 2 (two) times daily. 06/06/24  Yes Hunsucker, Donnice SAUNDERS, MD  latanoprost  (XALATAN ) 0.005 % ophthalmic solution Place 1 drop into both eyes at bedtime. 09/23/22  Yes [provider]  Multiple Vitamin (MULTIVITAMIN WITH MINERALS) TABS tablet Take 1 tablet by mouth daily.   Yes [provider]  Nutritional Supplements (ENSURE HIGH PROTEIN) LIQD Take 0.5 Bottles by mouth daily as needed (for supplementation).   Yes [provider]  OXYGEN  Inhale 6 L/min into the lungs continuous. Patient taking differently: Inhale 6 L/min into the lungs continuous.   Yes [provider]  spironolactone  (ALDACTONE ) 25 MG tablet Take 1/2 tablet ( 12.5 mg ) every morning 02/18/24  Yes Crenshaw, Redell RAMAN, MD  sucralfate  (CARAFATE ) 1 GM/10ML suspension TAKE 10 MLS BY MOUTH EVERY 6 HOURS AS NEEDED FOR REFLUX 04/18/24  Yes Nandigam, Kavitha V, MD  triamcinolone  cream (KENALOG ) 0.1 % Apply 1 Application topically See admin instructions. Apply to affected leg(s) after showers and up to 2 additional times a day as needed for irritation 07/07/23  Yes [provider]  clobetasol ointment (TEMOVATE) 0.05 % Apply 1 Application topically 2 (two) times daily as needed (for irritation). Patient not taking: Reported on 06/15/2024 04/20/23   [provider]  nirmatrelvir /ritonavir , renal dosing, (PAXLOVID ) 10 x 150 MG & 10 x 100MG  TABS Take 2 tablets by mouth 2 (two) times daily for 5 days. (Take nirmatrelvir  150 mg one tablet twice daily for 5 days and ritonavir  100 mg one tablet twice daily for 5 days) Patient GFR is 34 06/14/24 06/19/24  [name], FNP  revefenacin  (YUPELRI ) 175 MCG/3ML nebulizer solution Take 3 mLs (175 mcg total) by nebulization daily. Patient not taking: Reported on 06/15/2024 05/16/24   Hunsucker, Donnice SAUNDERS, MD    Physical Exam: BP (!) 149/65 (BP Location: Right Arm)   Pulse 80   Temp 98 F (36.7 C)  (Oral)   Resp 19   SpO2 97%  General: Frail elderly woman resting comfortably on 5 L nasal cannula oxygen .  Speaking in full sentences, she looks tired but not in any acute distress.  She is awake alert and oriented x 4.  Her daughter is at the bedside. Cardiovascular: RRR, no murmurs or rubs, she has only trace peripheral edema  Respiratory: Breath sounds are severely diminished, she has bilateral basilar crackles, no current wheezing or rhonchi, no tachypnea Abdomen: soft,  nontender, nondistended, normal bowel tones heard  Skin: dry, no rashes  Musculoskeletal: no joint effusions, normal range of motion  Neurologic: extraocular muscles intact, clear speech, moving all extremities with intact sensorium         Labs on Admission:  Basic Metabolic Panel: Recent Labs  Lab 06/15/24 0003 06/15/24 0010 06/15/24 0422  NA 142 140 141  K 4.5 4.3 4.4  CL 99 93* 97*  CO2 32  --  32  GLUCOSE 95 96 81  BUN 53* 55* 46*  CREATININE 1.37* 1.50* 1.26*  CALCIUM  8.8*  --  8.8*  MG  --   --  2.0  PHOS  --   --  3.0   Liver Function Tests: Recent Labs  Lab 06/15/24 0422  AST 30  ALT 10  ALKPHOS 71  BILITOT 0.5  PROT 6.1*  ALBUMIN 3.8   No results for input(s): LIPASE, AMYLASE in the last 168 hours. No results for input(s): AMMONIA in the last 168 hours. CBC: Recent Labs  Lab 06/15/24 0003 06/15/24 0010 06/15/24 0422  WBC 4.0  --  3.8*  NEUTROABS 2.6  --  2.4  HGB 8.5* 8.5* 8.6*  HCT 28.9* 25.0* 27.9*  MCV 118.0*  --  115.8*  PLT 142*  --  142*   Cardiac Enzymes: No results for input(s): CKTOTAL, CKMB, CKMBINDEX, TROPONINI in the last 168 hours. BNP (last 3 results) Recent Labs    08/29/23 1613 12/04/23 1511 03/23/24 2023  BNP 2,168.5* 1,686.5* 2,711.9*    ProBNP (last 3 results) Recent Labs    06/15/24 0003  PROBNP 10,419.0*    CBG: No results for input(s): GLUCAP in the last 168 hours.  Radiological Exams on Admission: DG Chest 2 View Result  Date: 06/15/2024 CLINICAL DATA:  COVID positive with elevated CO2 levels EXAM: CHEST - 2 VIEW COMPARISON:  March 23, 2024 FINDINGS: The cardiac silhouette is markedly enlarged and unchanged in size. Moderate severity diffusely increased interstitial lung markings are noted. This is increased in severity when compared to the prior study. A left pleural effusion is suspected. No pneumothorax is identified. Extensive breast attenuation artifact is seen overlying the bilateral lung bases. Radiopaque surgical clips are present along the left axilla. Multilevel degenerative changes are seen throughout the thoracic spine. IMPRESSION: 1. Cardiomegaly with moderate severity interstitial edema. 2. Left basilar atelectasis and/or infiltrate with a left pleural effusion. Electronically Signed   By: Suzen Dials M.D.   On: 06/15/2024 00:52   Assessment/Plan Paula Ward is a 88 y.o. female with medical history significant for CKD stage IIIb, chronic anemia, pulmonary arterial hypertension, interstitial lung disease chronically on 5 L nasal cannula oxygen  admitted to the hospital with concern for acute on chronic diastolic congestive heart failure.   Acute on chronic diastolic congestive heart failure-patient presented with lower extremity edema, stable dyspnea with exertion, and elevated proBNP.  Currently she looks only slightly volume overloaded, family and patient agree that her peripheral edema have improved. -Inpatient admission -Monitor on progressive unit with telemetry -Heart healthy diet with fluid restriction -Continue IV Lasix  80 mg daily, probably just for another 24 hours -Recheck proBNP level in the morning -Inpatient cardiology consultation requested  Essential hypertension-Cardizem  and Aldactone   Chronic hypoxic respiratory failure-due to end-stage interstitial lung disease, and pulmonary artery hypertension.  She has chronic uncompensated hypercapnia.  She uses NIV at home, her pulmonary  symptoms are stable.  Followed as an outpatient by Dr. Annella.  She was previously on hospice, but discontinued  this as she was doing better.  Discussed with Dr. Kara who will be kind enough to see her in the hospital today. -BiPAP nightly -Pulmonology evaluation much appreciated  COVID-positive-given she is essentially asymptomatic, on her chronic baseline oxygen , probably does not meet criteria for COVID-specific therapy.  Will defer to pulmonology evaluation.  CKD stage III-stable, monitor with daily labs in the setting of diuresis  Anemia of chronic disease-appears to be at baseline  DVT prophylaxis: Subcutaneous heparin     Code Status: Limited: Do not attempt resuscitation (DNR) -DNR-LIMITED -Do Not Intubate/DNI   Consults called: Pulmonology, cardiology  Admission status: The appropriate patient status for this patient is INPATIENT. Inpatient status is judged to be reasonable and necessary in order to provide the required intensity of service to ensure the patient's safety. The patient's presenting symptoms, physical exam findings, and initial radiographic and laboratory data in the context of their chronic comorbidities is felt to place them at high risk for further clinical deterioration. Furthermore, it is not anticipated that the patient will be medically stable for discharge from the hospital within 2 midnights of admission.    I certify that at the point of admission it is my clinical judgment that the patient will require inpatient hospital care spanning beyond 2 midnights from the point of admission due to high intensity of service, high risk for further deterioration and high frequency of surveillance required  Time spent: 59 minutes  Amerika Nourse CHRISTELLA Gail MD [facility] Pager [phone]  If 7PM-7AM, please contact night-coverage [email or web address] Password [email or web address]  06/15/2024, 7:57 AM

## 2024-06-15 NOTE — Plan of Care (Signed)
  Problem: Education: Goal: Knowledge of risk factors and measures for prevention of condition will improve Outcome: Progressing   Problem: Coping: Goal: Psychosocial and spiritual needs will be supported Outcome: Progressing   Problem: Respiratory: Goal: Will maintain a patent airway Outcome: Progressing Goal: Complications related to the disease process, condition or treatment will be avoided or minimized Outcome: Progressing   Problem: Education: Goal: Knowledge of General Education information will improve Description: Including pain rating scale, medication(s)/side effects and non-pharmacologic comfort measures Outcome: Progressing   Problem: Health Behavior/Discharge Planning: Goal: Ability to manage health-related needs will improve Outcome: Progressing   Problem: Clinical Measurements: Goal: Ability to maintain clinical measurements within normal limits will improve Outcome: Progressing Goal: Will remain free from infection Outcome: Progressing Goal: Diagnostic test results will improve Outcome: Progressing Goal: Respiratory complications will improve Outcome: Progressing Goal: Cardiovascular complication will be avoided Outcome: Progressing   Problem: Activity: Goal: Risk for activity intolerance will decrease Outcome: Progressing   Problem: Nutrition: Goal: Adequate nutrition will be maintained Outcome: Progressing   Problem: Coping: Goal: Level of anxiety will decrease Outcome: Progressing   Problem: Elimination: Goal: Will not experience complications related to bowel motility Outcome: Progressing Goal: Will not experience complications related to urinary retention Outcome: Progressing   Problem: Pain Managment: Goal: General experience of comfort will improve and/or be controlled Outcome: Progressing   Problem: Safety: Goal: Ability to remain free from injury will improve Outcome: Progressing   Problem: Skin Integrity: Goal: Risk for impaired  skin integrity will decrease Outcome: Progressing   Problem: Education: Goal: Ability to demonstrate management of disease process will improve Outcome: Progressing Goal: Ability to verbalize understanding of medication therapies will improve Outcome: Progressing Goal: Individualized Educational Video(s) Outcome: Progressing   Problem: Activity: Goal: Capacity to carry out activities will improve Outcome: Progressing   Problem: Cardiac: Goal: Ability to achieve and maintain adequate cardiopulmonary perfusion will improve Outcome: Progressing

## 2024-06-15 NOTE — ED Provider Notes (Signed)
 [place] [facility] Provider Note   CSN: [id number] Arrival date & time: 06/14/24  2214     Patient presents with: Shortness of Breath   Paula Ward is a 88 y.o. female.  Patient with past medical history significant for breast cancer, CREST syndrome, history of PE, chronic anemia, chronic respiratory failure on 5 L/min of oxygen  at baseline, pulmonary artery hypertension, CKD stage IIIb, chronic diastolic heart failure presents to the Emergency Department complaining of worsening shortness of breath.  She went to both her cardiologist and primary care earlier today.  Both stated that she may need hospital admission for further management of fluid overload.  Cardiology notes mention both needs for possible IV diuretic and possible BiPAP.  Patient has recently been admitted to the hospital requiring BiPAP due to severe hypercarbia.  The patient is a DNR and continues to endorse the wish for no intubation, CPR.  She does wish for medical interventions up into that point.  At the primary care office today the patient reports testing positive for COVID as well.  She denies chest pain, abdominal pain, nausea vomiting.  She endorses worsening shortness of breath and is currently using 5 L/min of oxygen  which is her baseline.    Shortness of Breath      Prior to Admission medications   Medication Sig Start Date End Date Taking? Authorizing Provider  acetaminophen  (TYLENOL ) 325 MG tablet Take 2 tablets (650 mg total) by mouth every 6 (six) hours as needed for moderate pain. 07/18/22   Christopher Savannah, PA-C  albuterol  (PROVENTIL ) (2.5 MG/3ML) 0.083% nebulizer solution Take 3 mLs (2.5 mg total) by nebulization every 6 (six) hours as needed for wheezing or shortness of breath. 09/03/23   Pokhrel, Vernal, MD  albuterol  (VENTOLIN  HFA) 108 (90 Base) MCG/ACT inhaler INHALE 2 PUFFS INTO THE LUNGS EVERY 6 HOURS AS NEEDED FOR WHEEZING OR SHORTNESS OF BREATH 03/13/23    Rollene Almarie LABOR, MD  arformoterol  (BROVANA ) 15 MCG/2ML NEBU Take 2 mLs (15 mcg total) by nebulization 2 (two) times daily. 11/11/23   Hunsucker, Donnice SAUNDERS, MD  augmented betamethasone dipropionate (DIPROLENE-AF) 0.05 % cream Apply 1 application  topically 2 (two) times daily as needed (for irritation). 07/13/23   [provider]  clobetasol ointment (TEMOVATE) 0.05 % Apply 1 Application topically 2 (two) times daily as needed (for irritation). 04/20/23   [provider]  diltiazem  (CARDIZEM ) 30 MG tablet Take 1 tablet (30 mg total) by mouth every 12 (twelve) hours. 12/04/23   Emelia Josefa HERO, NP  docusate sodium  (COLACE) 100 MG capsule Take 100 mg by mouth daily.    [provider]  empagliflozin  (JARDIANCE ) 10 MG TABS tablet Take 1 tablet (10 mg total) by mouth daily. 08/16/23   Patsy Lenis, MD  famotidine  (PEPCID ) 20 MG tablet TAKE 1 TABLET BY MOUTH AT BEDTIME 06/06/24   Nandigam, Kavitha V, MD  fluticasone  (FLONASE ) 50 MCG/ACT nasal spray USE 1 SPRAY(S) IN EACH NOSTRIL ONCE DAILY AS NEEDED FOR ALLERGIES 12/04/23   Rollene Almarie LABOR, MD  furosemide  (LASIX ) 20 MG tablet Take 2 tablets ( 40 mg ) every morning may take extra 2 tablets for weight gain of 3 lbs 02/19/24   Pietro Redell RAMAN, MD  hydrOXYzine  (ATARAX ) 10 MG tablet Take 1 tablet (10 mg total) by mouth 3 (three) times daily as needed for anxiety. 04/19/24   Rollene Almarie LABOR, MD  ipratropium (ATROVENT ) 0.02 % nebulizer solution Take 2.5 mLs (0.5 mg  total) by nebulization 2 (two) times daily. 06/06/24   Hunsucker, Donnice SAUNDERS, MD  latanoprost  (XALATAN ) 0.005 % ophthalmic solution Place 1 drop into both eyes at bedtime. 09/23/22   [provider]  magic mouthwash SOLN Take 10 mLs by mouth 4 (four) times daily as needed for mouth pain. 03/22/22   Adhikari, Amrit, MD  Multiple Vitamin (MULTIVITAMIN WITH MINERALS) TABS tablet Take 1 tablet by mouth daily.    [provider]  nirmatrelvir /ritonavir ,  renal dosing, (PAXLOVID ) 10 x 150 MG & 10 x 100MG  TABS Take 2 tablets by mouth 2 (two) times daily for 5 days. (Take nirmatrelvir  150 mg one tablet twice daily for 5 days and ritonavir  100 mg one tablet twice daily for 5 days) Patient GFR is 34 06/14/24 06/19/24  [name], FNP  Nutritional Supplements (ENSURE HIGH PROTEIN) LIQD Take 0.5 Bottles by mouth daily as needed (for supplementation).    [provider]  oxyCODONE  (ROXICODONE ) 5 MG/5ML solution Take 0.25 mLs (0.25 mg total) by mouth every 3 (three) hours as needed for moderate pain (pain score 4-6) or severe pain (pain score 7-10) (shortness of breath). 03/26/24   Briana Elgin LABOR, MD  OXYGEN  Inhale 6 L/min into the lungs continuous. Patient taking differently: Inhale 6 L/min into the lungs continuous.    [provider]  pantoprazole  (PROTONIX ) 40 MG tablet TAKE 1 TABLET BY MOUTH TWICE DAILY BEFORE A MEAL 10/19/23   Nandigam, Kavitha V, MD  revefenacin  (YUPELRI ) 175 MCG/3ML nebulizer solution Take 3 mLs (175 mcg total) by nebulization daily. 05/16/24   Hunsucker, Donnice SAUNDERS, MD  spironolactone  (ALDACTONE ) 25 MG tablet Take 1/2 tablet ( 12.5 mg ) every morning 02/18/24   Pietro Redell RAMAN, MD  sucralfate  (CARAFATE ) 1 GM/10ML suspension TAKE 10 MLS BY MOUTH EVERY 6 HOURS AS NEEDED FOR REFLUX 04/18/24   Nandigam, Kavitha V, MD  triamcinolone  cream (KENALOG ) 0.1 % Apply 1 Application topically See admin instructions. Apply to affected leg(s) after showers and up to 2 additional times a day as needed for irritation 07/07/23   [provider]    Allergies: Codeine , Other, Erythromycin, Iodinated contrast media, Lisinopril, Mirtazapine , Mycophenolate mofetil, and Sulfa  antibiotics    Review of Systems  Respiratory:  Positive for shortness of breath.     Updated Vital Signs BP (!) 149/65 (BP Location: Right Arm)   Pulse 86   Temp 97.9 F (36.6 C)   Resp 19   SpO2 99%   Physical Exam Vitals and nursing note  reviewed.  Constitutional:      General: She is not in acute distress.    Appearance: She is well-developed.  HENT:     Head: Normocephalic and atraumatic.  Eyes:     Conjunctiva/sclera: Conjunctivae normal.  Cardiovascular:     Rate and Rhythm: Normal rate and regular rhythm.  Pulmonary:     Effort: Respiratory distress present.     Breath sounds: Rales present.  Chest:     Chest wall: No tenderness.  Abdominal:     Palpations: Abdomen is soft.     Tenderness: There is no abdominal tenderness.  Musculoskeletal:        General: No swelling.     Cervical back: Neck supple.  Skin:    General: Skin is warm and dry.  Neurological:     Mental Status: She is alert.  Psychiatric:        Mood and Affect: Mood normal.     (all labs ordered are listed,  but only abnormal results are displayed) Labs Reviewed  RESP PANEL BY RT-PCR (RSV, FLU A&B, COVID)  RVPGX2 - Abnormal; Notable for the following components:      Result Value   SARS Coronavirus 2 by RT PCR POSITIVE (*)    All other components within normal limits  BASIC METABOLIC PANEL WITH GFR - Abnormal; Notable for the following components:   BUN 53 (*)    Creatinine, Ser 1.37 (*)    Calcium  8.8 (*)    GFR, Estimated 38 (*)    All other components within normal limits  PRO BRAIN NATRIURETIC PEPTIDE - Abnormal; Notable for the following components:   Pro Brain Natriuretic Peptide 10,419.0 (*)    All other components within normal limits  CBC WITH DIFFERENTIAL/PLATELET - Abnormal; Notable for the following components:   RBC 2.45 (*)    Hemoglobin 8.5 (*)    HCT 28.9 (*)    MCV 118.0 (*)    MCH 34.7 (*)    MCHC 29.4 (*)    Platelets 142 (*)    All other components within normal limits  BLOOD GAS, VENOUS - Abnormal; Notable for the following components:   pCO2, Ven 71 (*)    pO2, Ven 48 (*)    Bicarbonate 42.0 (*)    Acid-Base Excess 13.5 (*)    All other components within normal limits  I-STAT CHEM 8, ED - Abnormal;  Notable for the following components:   Chloride 93 (*)    BUN 55 (*)    Creatinine, Ser 1.50 (*)    TCO2 38 (*)    Hemoglobin 8.5 (*)    HCT 25.0 (*)    All other components within normal limits    EKG: None  Radiology: DG Chest 2 View Result Date: 06/15/2024 CLINICAL DATA:  COVID positive with elevated CO2 levels EXAM: CHEST - 2 VIEW COMPARISON:  March 23, 2024 FINDINGS: The cardiac silhouette is markedly enlarged and unchanged in size. Moderate severity diffusely increased interstitial lung markings are noted. This is increased in severity when compared to the prior study. A left pleural effusion is suspected. No pneumothorax is identified. Extensive breast attenuation artifact is seen overlying the bilateral lung bases. Radiopaque surgical clips are present along the left axilla. Multilevel degenerative changes are seen throughout the thoracic spine. IMPRESSION: 1. Cardiomegaly with moderate severity interstitial edema. 2. Left basilar atelectasis and/or infiltrate with a left pleural effusion. Electronically Signed   By: Suzen Dials M.D.   On: 06/15/2024 00:52     .Critical Care  Performed by: Logan Ubaldo NOVAK, PA-C Authorized by: Logan Ubaldo NOVAK, PA-C   Critical care provider statement:    Critical care time (minutes):  30   Critical care time was exclusive of:  Separately billable procedures and treating other patients   Critical care was necessary to treat or prevent imminent or life-threatening deterioration of the following conditions:  Respiratory failure   Critical care was time spent personally by me on the following activities:  Development of treatment plan with patient or surrogate, discussions with consultants, evaluation of patient's response to treatment, examination of patient, ordering and review of laboratory studies, ordering and review of radiographic studies, ordering and performing treatments and interventions, pulse oximetry, re-evaluation of patient's  condition and review of old charts   Care discussed with: admitting provider      Medications Ordered in the ED  furosemide  (LASIX ) injection 80 mg (80 mg Intravenous Given 06/15/24 0121)  Medical Decision Making Amount and/or Complexity of Data Reviewed Labs: ordered. Radiology: ordered.  Risk Prescription drug management. Decision regarding hospitalization.   This patient presents to the ED for concern of shortness of breath, this involves an extensive number of treatment options, and is a complaint that carries with it a high risk of complications and morbidity.  The differential diagnosis includes heart failure exacerbation, viral illness, pneumonia, others   Co morbidities / Chronic conditions that complicate the patient evaluation  Heart failure, CKD, PAH   Additional history obtained:  Additional history obtained from EMR External records from outside source obtained and reviewed including cardiology and primary care notes   Lab Tests:  I Ordered, and personally interpreted labs.  The pertinent results include: pCO2 of 71, no leukocytosis, P BNP 10,419, positive COVID test   Imaging Studies ordered:  I ordered imaging studies including chest x-ray I independently visualized and interpreted imaging which showed  1. Cardiomegaly with moderate severity interstitial edema.  2. Left basilar atelectasis and/or infiltrate with a left pleural  effusion.   I agree with the radiologist interpretation   Cardiac Monitoring: / EKG:  The patient was maintained on a cardiac monitor.  I personally viewed and interpreted the cardiac monitored which showed an underlying rhythm of: Sinus rhythm   Problem List / ED Course / Critical interventions / Medication management   I ordered medication including Lasix  Reevaluation of the patient after these medicines showed that the patient had some improvement    Consultations Obtained:  I  requested consultation with the hospitalist, Dr. Marcene,  and discussed lab and imaging findings as well as pertinent plan - they recommend: admission   Test / Admission - Considered:  Patient with significantly elevated BNP, rales on physical exam consistent with fluid overload.  Patient needs further IV diuretics.  At this time I do not feel the patient's work of breathing requires emergent BiPAP but this may be needed in the future if her work of breathing worsens.  The patient also has a concurrent COVID-19 infection.  Patient needing admission for further management at this time.      Final diagnoses:  Acute on chronic diastolic (congestive) heart failure [facility])  COVID-19    ED Discharge Orders     None          Logan Ubaldo KATHEE DEVONNA 06/15/24 0309    Griselda Norris, MD 06/15/24 [phone]

## 2024-06-15 NOTE — Addendum Note (Signed)
 Addended by: EMELIA JOSEFA HERO on: 06/15/2024 11:51 AM   Modules accepted: Level of Service

## 2024-06-15 NOTE — Plan of Care (Signed)
  Daily Progress Note   Patient Name: Paula Ward       Date: 06/15/2024 DOB: [birth year]  Age: 88 y.o. MRN#: [id number] Attending Physician: Zella Katha HERO, MD Primary Care Physician: Rollene Almarie LABOR, MD Admit Date: 06/14/2024 Length of Stay: 0 days  Discussed care with primary hospitalist today. Patient known to PMT provider from prior hospitalization. Patient had been discharged from hospital in June with [facility] home hospice support and then revoked these services once she felt better. Patient continues to follow up with outpatient providers to continue aggressive medical interventions. Code status is already noted to be DNR/DNI.  At this time, hospitalist noting no acute PMT needs. Palliative consult will be canceled at this time. Can place consult should acute PMT needs arise. Thank you.    Tinnie Radar, DO Palliative Care Provider PMT # [phone]  No Charge Note

## 2024-06-15 NOTE — ED Notes (Signed)
 2nd VBG sent down

## 2024-06-15 NOTE — Progress Notes (Signed)
  Carryover admission to the Day Admitter.  I discussed this case with the EDP, [name].  Per these discussions:   This is a 88 year old female with chronic hypoxic respiratory failure on 5 L continuous nasal cannula at baseline, chronic diastolic heart failure, who is being admitted with acute on chronic diastolic heart failure, and also noted to be positive for COVID-19.  She is at presented with approximately 1 week of progressive shortness of breath with worsening edema in the bilateral lower extremities.  In this context, she presented to the office of her outpatient cardiologist earlier today, who recommended that she present to the ED for admission for acute on chronic diastolic heart failure including pursuit of IV diuresis.  She is reported to have a similar presentation leading to admission during her first week of June 2025.  She is maintaining oxygen  saturations in the mid to high 90s on her baseline 5 L continuous nasal cannula.   COVID-19 PCR found to be positive.  EDP ordered Lasix  80 mg IV x 1 dose.  I have placed an order for inpatient admission to PCU for further evaluation management of the above.  I have placed some additional preliminary admit orders via the adult multi-morbid admission order set. I have also ordered Lasix  80 mg IV twice daily in addition to morning labs include CMP, CBC, magnesium level.  Regarding her finding of COVID-19 positive result, ordered flutter valve, incentive spirometry, CRP, procalcitonin level, prn Tessalon  Perles as well as Airborne/droplet precautions.  Per my discussions with the EDP, this patient is DNR/DNI.     Eva Pore, DO Hospitalist

## 2024-06-15 NOTE — ED Notes (Signed)
 Pt provided with breakfast tray.

## 2024-06-15 NOTE — ED Notes (Signed)
 Pt assisted to and from bedside commode.   Pt also provided with a wash basin and wash cloths per request.

## 2024-06-15 NOTE — Consult Note (Signed)
 NAME:  Paula Ward, MRN:  [id number], DOB:  1936-07-16, LOS: 0 ADMISSION DATE:  06/14/2024, CONSULTATION DATE:  06/15/24 REFERRING MD:  TRH CHIEF COMPLAINT:  ILD/Pulm Hypertension   History of Present Illness:  Paula Ward is an 88 year old woman with history of chronic hypoxemic respiratory failure, scleroderma ILD, pulmonary hypertension, chronic diastolic heart failure and CKD stage IIIb who was sent to the hospital from cardiology clinic for concern of acute on chronic diastolic heart failure.   VBG showed pH 7.38, pCO2 71. Cr 1.26. CRP 0.7, procal 0.16, hemoglobin 8.6 g/dL, WBC 3.8, platelets 857. BNP 10,419. CXR shows increased, diffuse interstitial lung markings.   She was given IV lasix  in the ER with reduction in lower extremity edema. Her respiratory viral testing came back positive for covid 19 infection. She reports her husband is sick too. She denies fevers chills or sweats. She does report dry cough.   She was admitted in June 2025 for hypercapnia, AMS and volume overload. She was setup with home NIV which she has been compliant with its use. She was previously on hospice, but improved enough to discontinue her hospice care.  Pertinent  Medical History  Chronic Anemia CKDIIIb Scleroderma ILD Chronic Respiratory Failure Pulmonary Hypertension Congestive heart failure  Significant Hospital Events: Including procedures, antibiotic start and stop dates in addition to other pertinent events   8/27 admitted for covid and heart failure exacerbation  Interim History / Subjective:  As above  Objective    Blood pressure (!) 164/64, pulse 76, temperature 97.7 F (36.5 C), temperature source Oral, resp. rate 18, SpO2 100%.        Intake/Output Summary (Last 24 hours) at 06/15/2024 1445 Last data filed at 06/15/2024 1043 Gross per 24 hour  Intake 0 ml  Output --  Net 0 ml   There were no vitals filed for this visit.  Examination: General: elderly woman, chronically  ill appearing, no distress HENT: [place]/AT, moist mucous membranes Lungs: diminished breath sounds Cardiovascular: rrr Abdomen: soft, non-tender, non-distended Extremities: warm, no edema Neuro: alert, moving all extremities GU: n/a  Resolved problem list   Assessment and Plan   Acute on Chronic Diastolic Heart Failure Valvular Heart Disease  Chronic Pericardial Effusion Chronic Hypoxemic and Hypercapnic Respiratory Failure Interstitial Lung Disease Pulmonary Hypertension Covid 19 Infection CKDIIIb  Plan: - start remdesivir  and decadron  for covid 19 infection, she is high risk for severe infection - Diuresis per primary team and cardiology - Procalcitonin is low, will hold off on antibiotics - continue Bipap therapy at bedtime for hypercapnic respiratory failure - Goal SpO2 92% or higher, currently on home 5L  PCCM will continue to follow  Best Practice (right click and Reselect all SmartList Selections daily)   Per primary team  Labs   CBC: Recent Labs  Lab 06/15/24 0003 06/15/24 0010 06/15/24 0422  WBC 4.0  --  3.8*  NEUTROABS 2.6  --  2.4  HGB 8.5* 8.5* 8.6*  HCT 28.9* 25.0* 27.9*  MCV 118.0*  --  115.8*  PLT 142*  --  142*    Basic Metabolic Panel: Recent Labs  Lab 06/15/24 0003 06/15/24 0010 06/15/24 0422  NA 142 140 141  K 4.5 4.3 4.4  CL 99 93* 97*  CO2 32  --  32  GLUCOSE 95 96 81  BUN 53* 55* 46*  CREATININE 1.37* 1.50* 1.26*  CALCIUM  8.8*  --  8.8*  MG  --   --  2.0  PHOS  --   --  3.0   GFR: Estimated Creatinine Clearance: 23.9 mL/min (A) (by C-G formula based on SCr of 1.26 mg/dL (H)). Recent Labs  Lab 06/15/24 0003 06/15/24 0422  PROCALCITON  --  0.16  WBC 4.0 3.8*    Liver Function Tests: Recent Labs  Lab 06/15/24 0422  AST 30  ALT 10  ALKPHOS 71  BILITOT 0.5  PROT 6.1*  ALBUMIN 3.8   No results for input(s): LIPASE, AMYLASE in the last 168 hours. No results for input(s): AMMONIA in the last 168 hours.  ABG     Component Value Date/Time   PHART 7.37 04/12/2024 1430   PCO2ART 57 (H) 04/12/2024 1430   PO2ART 127 (H) 04/12/2024 1430   HCO3 42.0 (H) 06/15/2024 0121   TCO2 38 (H) 06/15/2024 0010   ACIDBASEDEF 1.0 04/25/2022 1127   O2SAT 81.8 06/15/2024 0121     Coagulation Profile: No results for input(s): INR, PROTIME in the last 168 hours.  Cardiac Enzymes: No results for input(s): CKTOTAL, CKMB, CKMBINDEX, TROPONINI in the last 168 hours.  HbA1C: Hgb A1c MFr Bld  Date/Time Value Ref Range Status  01/06/2020 10:42 AM 5.2 4.6 - 6.5 % Final    Comment:    Glycemic Control Guidelines for People with Diabetes:Non Diabetic:  <6%Goal of Therapy: <7%Additional Action Suggested:  >8%     CBG: No results for input(s): GLUCAP in the last 168 hours.  Review of Systems:   Review of Systems  Constitutional:  Positive for malaise/fatigue. Negative for chills, fever and weight loss.  HENT:  Negative for congestion, sinus pain and sore throat.   Eyes: Negative.   Respiratory:  Positive for cough and shortness of breath. Negative for hemoptysis, sputum production and wheezing.   Cardiovascular:  Negative for chest pain, palpitations, orthopnea, claudication and leg swelling.  Gastrointestinal:  Negative for abdominal pain, heartburn, nausea and vomiting.  Genitourinary: Negative.   Musculoskeletal:  Negative for joint pain and myalgias.  Skin:  Negative for rash.  Neurological:  Positive for weakness.  Endo/Heme/Allergies: Negative.   Psychiatric/Behavioral: Negative.       Past Medical History:  She,  has a past medical history of Anemia, Angiodysplasia of stomach and duodenum, Aortic stenosis, Arthus phenomenon, AVM (arteriovenous malformation), Blood transfusion without reported diagnosis, Breast cancer (HCC) (1989), Candida esophagitis (HCC), Cataract, Chronic respiratory failure (HCC), CKD stage 3b, GFR 30-44 ml/min (HCC), Corneal edema, Corneal epithelial basement membrane  dystrophy, CREST syndrome (HCC), GERD (gastroesophageal reflux disease), Interstitial lung disease (HCC), Nodule of kidney, Pericardial effusion, PONV (postoperative nausea and vomiting), Pulmonary embolus (HCC) (2003), Pulmonary hypertension (HCC), Rectal incontinence, Renal cell carcinoma (HCC), Scleroderma (HCC), Tubular adenoma of colon, and Uterine polyp.   Surgical History:   Past Surgical History:  Procedure Laterality Date   APPENDECTOMY  1962   BREAST LUMPECTOMY  1989   left   CATARACT EXTRACTION, BILATERAL Bilateral 12/2013   COLONOSCOPY WITH PROPOFOL  N/A 04/15/2021   Procedure: COLONOSCOPY WITH PROPOFOL ;  Surgeon: Teressa Toribio SQUIBB, MD;  Location: [facility];  Service: Endoscopy;  Laterality: N/A;   ENTEROSCOPY N/A 01/18/2016   Procedure: ENTEROSCOPY;  Surgeon: Gustav Shila GAILS, MD;  Location: [facility];  Service: Endoscopy;  Laterality: N/A;   ENTEROSCOPY N/A 05/24/2018   Procedure: ENTEROSCOPY;  Surgeon: Charlanne Groom, MD;  Location: [facility];  Service: Endoscopy;  Laterality: N/A;   ENTEROSCOPY N/A 03/29/2021   Procedure: ENTEROSCOPY;  Surgeon: Albertus Gordy HERO, MD;  Location: [facility];  Service: Gastroenterology;  Laterality: N/A;   ENTEROSCOPY N/A 04/13/2021  Procedure: ENTEROSCOPY;  Surgeon: Leigh Elspeth SQUIBB, MD;  Location: [facility];  Service: Gastroenterology;  Laterality: N/A;   ESOPHAGOGASTRODUODENOSCOPY (EGD) WITH PROPOFOL  N/A 12/21/2015   Procedure: ESOPHAGOGASTRODUODENOSCOPY (EGD) WITH PROPOFOL ;  Surgeon: Norleen LOISE Kiang, MD;  Location: [facility];  Service: Endoscopy;  Laterality: N/A;   HOT HEMOSTASIS N/A 05/24/2018   Procedure: HOT HEMOSTASIS (ARGON PLASMA COAGULATION/BICAP);  Surgeon: Charlanne Groom, MD;  Location: [facility];  Service: Endoscopy;  Laterality: N/A;   HOT HEMOSTASIS N/A 03/29/2021   Procedure: HOT HEMOSTASIS (ARGON PLASMA COAGULATION/BICAP);  Surgeon: Albertus Gordy HERO, MD;  Location: [facility];  Service: Gastroenterology;  Laterality: N/A;    HOT HEMOSTASIS N/A 04/13/2021   Procedure: HOT HEMOSTASIS (ARGON PLASMA COAGULATION/BICAP);  Surgeon: Leigh Elspeth SQUIBB, MD;  Location: [facility];  Service: Gastroenterology;  Laterality: N/A;   INTRAMEDULLARY (IM) NAIL INTERTROCHANTERIC Left 10/11/2022   Procedure: INTRAMEDULLARY (IM) NAIL INTERTROCHANTERIC;  Surgeon: Melodi Lerner, MD;  Location: [facility];  Service: Orthopedics;  Laterality: Left;   IR GENERIC HISTORICAL  06/05/2016   IR RADIOLOGIST EVAL & MGMT 06/05/2016 Marcey Moan, MD [facility] INTERV RAD   IVC Filter     KIDNEY SURGERY     left -laser surgery by Dr. moan- 5 yrs ago no removal   RIGHT/LEFT HEART CATH AND CORONARY ANGIOGRAPHY N/A 04/25/2022   Procedure: RIGHT/LEFT HEART CATH AND CORONARY ANGIOGRAPHY;  Surgeon: Wonda Sharper, MD;  Location: [facility] INVASIVE CV LAB;  Service: Cardiovascular;  Laterality: N/A;   SCHLEROTHERAPY  05/24/2018   Procedure: WALDEMAR;  Surgeon: Charlanne Groom, MD;  Location: [facility];  Service: Endoscopy;;   SUBMUCOSAL TATTOO INJECTION  04/13/2021   Procedure: SUBMUCOSAL TATTOO INJECTION;  Surgeon: Leigh Elspeth SQUIBB, MD;  Location: [facility];  Service: Gastroenterology;;   TONSILLECTOMY     TUBAL LIGATION       Social History:   reports that she has never smoked. She has never used smokeless tobacco. She reports that she does not drink alcohol and does not use drugs.   Family History:  Her family history includes Alzheimer's disease in her mother; Bladder Cancer in her father; Diabetes in her father and sister; Esophageal cancer in her paternal uncle; Lung cancer in her sister; Prostate cancer in her father. There is no history of Colon cancer.   Allergies Allergies  Allergen Reactions   Codeine  Nausea Only    Hallucinations, too   Other Nausea And Vomiting and Other (See Comments)    -mycin antibiotics.   Also cause hallucinations.   Erythromycin Nausea And Vomiting   Iodinated Contrast Media Other (See Comments)     Renal issues   Lisinopril Other (See Comments)    Pt doesn't remember reaction   Mirtazapine  Other (See Comments)    Threw me into orbit   Mycophenolate Mofetil Nausea Only    Made me nervous   Sulfa  Antibiotics Other (See Comments)    Unknown reaction     Home Medications  Prior to Admission medications   Medication Sig Start Date End Date Taking? Authorizing Provider  acetaminophen  (TYLENOL ) 325 MG tablet Take 2 tablets (650 mg total) by mouth every 6 (six) hours as needed for moderate pain. 07/18/22  Yes Christopher Savannah, PA-C  albuterol  (PROVENTIL ) (2.5 MG/3ML) 0.083% nebulizer solution Take 3 mLs (2.5 mg total) by nebulization every 6 (six) hours as needed for wheezing or shortness of breath. 09/03/23  Yes Pokhrel, Laxman, MD  albuterol  (VENTOLIN  HFA) 108 (90 Base) MCG/ACT inhaler INHALE 2 PUFFS INTO THE LUNGS EVERY 6 HOURS  AS NEEDED FOR WHEEZING OR SHORTNESS OF BREATH 03/13/23  Yes Rollene Almarie LABOR, MD  arformoterol  (BROVANA ) 15 MCG/2ML NEBU Take 2 mLs (15 mcg total) by nebulization 2 (two) times daily. 11/11/23  Yes Hunsucker, Donnice SAUNDERS, MD  augmented betamethasone dipropionate (DIPROLENE-AF) 0.05 % cream Apply 1 application  topically 2 (two) times daily as needed (for irritation). 07/13/23  Yes [provider]  diltiazem  (CARDIZEM ) 30 MG tablet Take 1 tablet (30 mg total) by mouth every 12 (twelve) hours. 12/04/23  Yes Cleaver, Josefa HERO, NP  docusate sodium  (COLACE) 100 MG capsule Take 100 mg by mouth daily.   Yes [provider]  famotidine  (PEPCID ) 20 MG tablet TAKE 1 TABLET BY MOUTH AT BEDTIME 06/06/24  Yes Nandigam, Kavitha V, MD  fluticasone  (FLONASE ) 50 MCG/ACT nasal spray USE 1 SPRAY(S) IN EACH NOSTRIL ONCE DAILY AS NEEDED FOR ALLERGIES 12/04/23  Yes Rollene Almarie LABOR, MD  furosemide  (LASIX ) 20 MG tablet Take 2 tablets ( 40 mg ) every morning may take extra 2 tablets for weight gain of 3 lbs 02/19/24  Yes Pietro Redell RAMAN, MD  ipratropium (ATROVENT ) 0.02 %  nebulizer solution Take 2.5 mLs (0.5 mg total) by nebulization 2 (two) times daily. 06/06/24  Yes Hunsucker, Donnice SAUNDERS, MD  latanoprost  (XALATAN ) 0.005 % ophthalmic solution Place 1 drop into both eyes at bedtime. 09/23/22  Yes [provider]  Multiple Vitamin (MULTIVITAMIN WITH MINERALS) TABS tablet Take 1 tablet by mouth daily.   Yes [provider]  Nutritional Supplements (ENSURE HIGH PROTEIN) LIQD Take 0.5 Bottles by mouth daily as needed (for supplementation).   Yes [provider]  OXYGEN  Inhale 6 L/min into the lungs continuous. Patient taking differently: Inhale 6 L/min into the lungs continuous.   Yes [provider]  spironolactone  (ALDACTONE ) 25 MG tablet Take 1/2 tablet ( 12.5 mg ) every morning 02/18/24  Yes Crenshaw, Redell RAMAN, MD  sucralfate  (CARAFATE ) 1 GM/10ML suspension TAKE 10 MLS BY MOUTH EVERY 6 HOURS AS NEEDED FOR REFLUX 04/18/24  Yes Nandigam, Kavitha V, MD  triamcinolone  cream (KENALOG ) 0.1 % Apply 1 Application topically See admin instructions. Apply to affected leg(s) after showers and up to 2 additional times a day as needed for irritation 07/07/23  Yes [provider]  clobetasol ointment (TEMOVATE) 0.05 % Apply 1 Application topically 2 (two) times daily as needed (for irritation). Patient not taking: Reported on 06/15/2024 04/20/23   [provider]  nirmatrelvir /ritonavir , renal dosing, (PAXLOVID ) 10 x 150 MG & 10 x 100MG  TABS Take 2 tablets by mouth 2 (two) times daily for 5 days. (Take nirmatrelvir  150 mg one tablet twice daily for 5 days and ritonavir  100 mg one tablet twice daily for 5 days) Patient GFR is 34 06/14/24 06/19/24  [name], FNP  revefenacin  (YUPELRI ) 175 MCG/3ML nebulizer solution Take 3 mLs (175 mcg total) by nebulization daily. Patient not taking: Reported on 06/15/2024 05/16/24   Hunsucker, Donnice SAUNDERS, MD     Critical care time: n/a    Dorn Chill, MD [facility] Office: [phone]   See Amion for personal pager PCCM on call pager [phone] until 7pm. Please call Elink 7p-7a. [phone]

## 2024-06-15 NOTE — ED Notes (Signed)
 Provider at bedside

## 2024-06-16 ENCOUNTER — Encounter: Payer: Self-pay | Admitting: *Deleted

## 2024-06-16 ENCOUNTER — Telehealth: Payer: Self-pay | Admitting: *Deleted

## 2024-06-16 ENCOUNTER — Telehealth: Payer: Self-pay | Admitting: Pulmonary Disease

## 2024-06-16 ENCOUNTER — Telehealth

## 2024-06-16 DIAGNOSIS — I5033 Acute on chronic diastolic (congestive) heart failure: Secondary | ICD-10-CM | POA: Diagnosis not present

## 2024-06-16 LAB — CBC
HCT: 30.8 % — ABNORMAL LOW (ref 36.0–46.0)
Hemoglobin: 9.4 g/dL — ABNORMAL LOW (ref 12.0–15.0)
MCH: 35.5 pg — ABNORMAL HIGH (ref 26.0–34.0)
MCHC: 30.5 g/dL (ref 30.0–36.0)
MCV: 116.2 fL — ABNORMAL HIGH (ref 80.0–100.0)
Platelets: 159 K/uL (ref 150–400)
RBC: 2.65 MIL/uL — ABNORMAL LOW (ref 3.87–5.11)
RDW: 12.7 % (ref 11.5–15.5)
WBC: 3.4 K/uL — ABNORMAL LOW (ref 4.0–10.5)
nRBC: 0 % (ref 0.0–0.2)

## 2024-06-16 LAB — BASIC METABOLIC PANEL WITH GFR
Anion gap: 16 — ABNORMAL HIGH (ref 5–15)
BUN: 48 mg/dL — ABNORMAL HIGH (ref 8–23)
CO2: 29 mmol/L (ref 22–32)
Calcium: 8.6 mg/dL — ABNORMAL LOW (ref 8.9–10.3)
Chloride: 96 mmol/L — ABNORMAL LOW (ref 98–111)
Creatinine, Ser: 1.17 mg/dL — ABNORMAL HIGH (ref 0.44–1.00)
GFR, Estimated: 46 mL/min — ABNORMAL LOW (ref >60)
Glucose, Bld: 130 mg/dL — ABNORMAL HIGH (ref 70–99)
Potassium: 4.8 mmol/L (ref 3.5–5.1)
Sodium: 142 mmol/L (ref 135–145)

## 2024-06-16 LAB — PRO BRAIN NATRIURETIC PEPTIDE: Pro Brain Natriuretic Peptide: 12611 pg/mL — ABNORMAL HIGH (ref <300.0)

## 2024-06-16 MED ORDER — DEXAMETHASONE 6 MG PO TABS: 6.0000 mg | ORAL_TABLET | ORAL | 0 refills | Status: AC

## 2024-06-16 MED ORDER — ALPRAZOLAM 0.5 MG PO TABS: 0.5000 mg | ORAL_TABLET | Freq: Once | ORAL | Status: DC

## 2024-06-16 NOTE — Plan of Care (Signed)
   Problem: Education: Goal: Knowledge of risk factors and measures for prevention of condition will improve Outcome: Progressing   Problem: Coping: Goal: Psychosocial and spiritual needs will be supported Outcome: Progressing   Problem: Respiratory: Goal: Will maintain a patent airway Outcome: Progressing

## 2024-06-16 NOTE — Discharge Summary (Signed)
 Physician Discharge Summary  Paula Ward FMW:[id number] DOB: Jul 27, 1936 DOA: 06/14/2024  PCP: Rollene Almarie LABOR, MD  Admit date: 06/14/2024 Discharge date: 06/16/2024  Admitted From: Home Disposition: Home  Recommendations for Outpatient Follow-up:  Follow up with PCP in 1-2 weeks Please obtain BMP/CBC in one week Cardiology and pulmonology will schedule follow-up  Home Health: PT/OT Equipment/Devices: Available at home, oxygen , BiPAP  Discharge Condition: Fair CODE STATUS: DNR/DNI Diet recommendation: Regular diet  Discharge summary: 88 year old with history of CKD stage IIIb, chronic anemia, pulmonary arterial hypertension, interstitial lung disease, chronic hypoxemia on 5 to 6 L oxygen  at home as well BiPAP at home, chronic diastolic heart failure previously with home hospice who has revoked hospice due to feeling better.  She had a routine follow-up with cardiology 1 day prior to admit, she was noted to have mild lower extremity edema and was sent to the ER for IV diuresis.  Further workup revealed patient having COVID-19 infection.  Admitted for monitoring.  Acute on chronic hypoxemic respiratory failure.  Patient with chronic hypoxemia, chronic diastolic heart failure, interstitial fibrosis and advanced lung disease. Probably aggravated by COVID-19 infection. Patient was admitted to hospital, she was also treated with IV diuresis with good clinical response. Patient was started on Decadron  as well as remdesivir . Plan was to keep patient in the hospital, however with rapid clinical improvement she would like to go home as she has everything that she needs at home.  Her family agreed. Plan, Decadron  to continue for total 10 days. Paxlovid  for 5 days, already in the pharmacy. Continue all other supportive care including bronchodilator therapy, oxygen , PT OT at home.  Chronic medical issues including Essential hypertension, on Cardizem  and Aldactone .  Continue. CKD stage  IIIb: At about baseline. Anemia of chronic disease: Appears to be at about baseline.  She is being followed up at oncology, hematology clinic.  Stable for discharge.    Discharge Diagnoses:  Principal Problem:   Acute on chronic diastolic heart failure (HCC) Active Problems:   Acute diastolic (congestive) heart failure [facility])    Discharge Instructions  Discharge Instructions     Diet - low sodium heart healthy   Complete by: As directed    Increase activity slowly   Complete by: As directed       Allergies as of 06/16/2024       Reactions   Codeine  Nausea Only   Hallucinations, too   Other Nausea And Vomiting, Other (See Comments)   -mycin antibiotics.   Also cause hallucinations.   Erythromycin Nausea And Vomiting   Iodinated Contrast Media Other (See Comments)   Renal issues   Lisinopril Other (See Comments)   Pt doesn't remember reaction   Mirtazapine  Other (See Comments)   Threw me into orbit   Mycophenolate Mofetil Nausea Only   Made me nervous   Sulfa  Antibiotics Other (See Comments)   Unknown reaction        Medication List     TAKE these medications    acetaminophen  325 MG tablet Commonly known as: Tylenol  Take 2 tablets (650 mg total) by mouth every 6 (six) hours as needed for moderate pain.   albuterol  108 (90 Base) MCG/ACT inhaler Commonly known as: VENTOLIN  HFA INHALE 2 PUFFS INTO THE LUNGS EVERY 6 HOURS AS NEEDED FOR WHEEZING OR SHORTNESS OF BREATH   albuterol  (2.5 MG/3ML) 0.083% nebulizer solution Commonly known as: PROVENTIL  Take 3 mLs (2.5 mg total) by nebulization every 6 (six) hours as needed for wheezing or  shortness of breath.   arformoterol  15 MCG/2ML Nebu Commonly known as: BROVANA  Take 2 mLs (15 mcg total) by nebulization 2 (two) times daily.   augmented betamethasone dipropionate 0.05 % cream Commonly known as: DIPROLENE-AF Apply 1 application  topically 2 (two) times daily as needed (for irritation).   clobetasol  ointment 0.05 % Commonly known as: TEMOVATE Apply 1 Application topically 2 (two) times daily as needed (for irritation).   dexamethasone  6 MG tablet Commonly known as: DECADRON  Take 1 tablet (6 mg total) by mouth daily for 9 days.   diltiazem  30 MG tablet Commonly known as: CARDIZEM  Take 1 tablet (30 mg total) by mouth every 12 (twelve) hours.   docusate sodium  100 MG capsule Commonly known as: COLACE Take 100 mg by mouth daily.   Ensure High Protein Liqd Take 0.5 Bottles by mouth daily as needed (for supplementation).   famotidine  20 MG tablet Commonly known as: PEPCID  TAKE 1 TABLET BY MOUTH AT BEDTIME   fluticasone  50 MCG/ACT nasal spray Commonly known as: FLONASE  USE 1 SPRAY(S) IN EACH NOSTRIL ONCE DAILY AS NEEDED FOR ALLERGIES   furosemide  20 MG tablet Commonly known as: LASIX  Take 2 tablets ( 40 mg ) every morning may take extra 2 tablets for weight gain of 3 lbs   ipratropium 0.02 % nebulizer solution Commonly known as: ATROVENT  Take 2.5 mLs (0.5 mg total) by nebulization 2 (two) times daily.   latanoprost  0.005 % ophthalmic solution Commonly known as: XALATAN  Place 1 drop into both eyes at bedtime.   multivitamin with minerals Tabs tablet Take 1 tablet by mouth daily.   nirmatrelvir /ritonavir  (renal dosing) 10 x 150 MG & 10 x 100MG  Tabs Commonly known as: PAXLOVID  Take 2 tablets by mouth 2 (two) times daily for 5 days. (Take nirmatrelvir  150 mg one tablet twice daily for 5 days and ritonavir  100 mg one tablet twice daily for 5 days) Patient GFR is 34   OXYGEN  Inhale 6 L/min into the lungs continuous.   spironolactone  25 MG tablet Commonly known as: ALDACTONE  Take 1/2 tablet ( 12.5 mg ) every morning   sucralfate  1 GM/10ML suspension Commonly known as: CARAFATE  TAKE 10 MLS BY MOUTH EVERY 6 HOURS AS NEEDED FOR REFLUX   triamcinolone  cream 0.1 % Commonly known as: KENALOG  Apply 1 Application topically See admin instructions. Apply to affected leg(s)  after showers and up to 2 additional times a day as needed for irritation   Yupelri  175 MCG/3ML nebulizer solution Generic drug: revefenacin  Take 3 mLs (175 mcg total) by nebulization daily.        Allergies  Allergen Reactions   Codeine  Nausea Only    Hallucinations, too   Other Nausea And Vomiting and Other (See Comments)    -mycin antibiotics.   Also cause hallucinations.   Erythromycin Nausea And Vomiting   Iodinated Contrast Media Other (See Comments)    Renal issues   Lisinopril Other (See Comments)    Pt doesn't remember reaction   Mirtazapine  Other (See Comments)    Threw me into orbit   Mycophenolate Mofetil Nausea Only    Made me nervous   Sulfa  Antibiotics Other (See Comments)    Unknown reaction    Consultations: Pulmonary Cardiology Palliative   Procedures/Studies: DG Chest 2 View Result Date: 06/15/2024 CLINICAL DATA:  COVID positive with elevated CO2 levels EXAM: CHEST - 2 VIEW COMPARISON:  March 23, 2024 FINDINGS: The cardiac silhouette is markedly enlarged and unchanged in size. Moderate severity diffusely increased interstitial lung  markings are noted. This is increased in severity when compared to the prior study. A left pleural effusion is suspected. No pneumothorax is identified. Extensive breast attenuation artifact is seen overlying the bilateral lung bases. Radiopaque surgical clips are present along the left axilla. Multilevel degenerative changes are seen throughout the thoracic spine. IMPRESSION: 1. Cardiomegaly with moderate severity interstitial edema. 2. Left basilar atelectasis and/or infiltrate with a left pleural effusion. Electronically Signed   By: Suzen Dials M.D.   On: 06/15/2024 00:52   (Echo, Carotid, EGD, Colonoscopy, ERCP)    Subjective: Patient seen and examined in the morning rounds.  Daughter on the speaker phone.  Patient tells me she feels back to herself and would like to go home.  She tells me she has everything that  she needs at home.   Discharge Exam: Vitals:   06/15/24 1701 06/16/24 0356  BP: (!) 130/53 (!) 147/59  Pulse: 84 84  Resp:  18  Temp: 98.1 F (36.7 C) 98.2 F (36.8 C)  SpO2: 99% 96%   Vitals:   06/15/24 1513 06/15/24 1701 06/16/24 0356 06/16/24 0402  BP: (!) 164/64 (!) 130/53 (!) 147/59   Pulse: 76 84 84   Resp: 18  18   Temp: 97.7 F (36.5 C) 98.1 F (36.7 C) 98.2 F (36.8 C)   TempSrc: Oral Oral Oral   SpO2:  99% 96%   Weight: 46.3 kg   46.1 kg  Height: 5' 3 (1.6 m)       General: Pt is alert, awake, not in acute distress Chronically sick looking.  Debilitated.  Looks fairly comfortable and able to talk in complete sentences. Cardiovascular: RRR, S1/S2 +, no rubs, no gallops Respiratory: CTA bilaterally, no wheezing, some conducted upper airway sounds.  Able to talk in complete sentences.  On 5 L oxygen . Abdominal: Soft, NT, ND, bowel sounds + Extremities: no edema, no cyanosis    The results of significant diagnostics from this hospitalization (including imaging, microbiology, ancillary and laboratory) are listed below for reference.     Microbiology: Recent Results (from the past 240 hours)  Resp panel by RT-PCR (RSV, Flu A&B, Covid) Anterior Nasal Swab     Status: Abnormal   Collection Time: 06/14/24 12:10 AM   Specimen: Anterior Nasal Swab  Result Value Ref Range Status   SARS Coronavirus 2 by RT PCR POSITIVE (A) NEGATIVE Final    Comment: (NOTE) SARS-CoV-2 target nucleic acids are DETECTED.  The SARS-CoV-2 RNA is generally detectable in upper respiratory specimens during the acute phase of infection. Positive results are indicative of the presence of the identified virus, but do not rule out bacterial infection or co-infection with other pathogens not detected by the test. Clinical correlation with patient history and other diagnostic information is necessary to determine patient infection status. The expected result is Negative.  Fact Sheet for  Patients: BloggerCourse.com  Fact Sheet for Healthcare Providers: SeriousBroker.it  This test is not yet approved or cleared by the [place]  FDA and  has been authorized for detection and/or diagnosis of SARS-CoV-2 by FDA under an Emergency Use Authorization (EUA).  This EUA will remain in effect (meaning this test can be used) for the duration of  the COVID-19 declaration under Section 564(b)(1) of the A ct, 21 U.S.C. section 360bbb-3(b)(1), unless the authorization is terminated or revoked sooner.     Influenza A by PCR NEGATIVE NEGATIVE Final   Influenza B by PCR NEGATIVE NEGATIVE Final    Comment: (NOTE) The Xpert  Xpress SARS-CoV-2/FLU/RSV plus assay is intended as an aid in the diagnosis of influenza from Nasopharyngeal swab specimens and should not be used as a sole basis for treatment. Nasal washings and aspirates are unacceptable for Xpert Xpress SARS-CoV-2/FLU/RSV testing.  Fact Sheet for Patients: BloggerCourse.com  Fact Sheet for Healthcare Providers: SeriousBroker.it  This test is not yet approved or cleared by the [place]  FDA and has been authorized for detection and/or diagnosis of SARS-CoV-2 by FDA under an Emergency Use Authorization (EUA). This EUA will remain in effect (meaning this test can be used) for the duration of the COVID-19 declaration under Section 564(b)(1) of the Act, 21 U.S.C. section 360bbb-3(b)(1), unless the authorization is terminated or revoked.     Resp Syncytial Virus by PCR NEGATIVE NEGATIVE Final    Comment: (NOTE) Fact Sheet for Patients: BloggerCourse.com  Fact Sheet for Healthcare Providers: SeriousBroker.it  This test is not yet approved or cleared by the [place]  FDA and has been authorized for detection and/or diagnosis of SARS-CoV-2 by FDA under an Emergency Use  Authorization (EUA). This EUA will remain in effect (meaning this test can be used) for the duration of the COVID-19 declaration under Section 564(b)(1) of the Act, 21 U.S.C. section 360bbb-3(b)(1), unless the authorization is terminated or revoked.  Performed at [facility], [address] [place], [place] [address]      Labs: BNP (last 3 results) Recent Labs    08/29/23 1613 12/04/23 1511 03/23/24 2023  BNP 2,168.5* 1,686.5* 2,711.9*   Basic Metabolic Panel: Recent Labs  Lab 06/15/24 0003 06/15/24 0010 06/15/24 0422 06/16/24 0444  NA 142 140 141 142  K 4.5 4.3 4.4 4.8  CL 99 93* 97* 96*  CO2 32  --  32 29  GLUCOSE 95 96 81 130*  BUN 53* 55* 46* 48*  CREATININE 1.37* 1.50* 1.26* 1.17*  CALCIUM  8.8*  --  8.8* 8.6*  MG  --   --  2.0  --   PHOS  --   --  3.0  --    Liver Function Tests: Recent Labs  Lab 06/15/24 0422  AST 30  ALT 10  ALKPHOS 71  BILITOT 0.5  PROT 6.1*  ALBUMIN 3.8   No results for input(s): LIPASE, AMYLASE in the last 168 hours. No results for input(s): AMMONIA in the last 168 hours. CBC: Recent Labs  Lab 06/15/24 0003 06/15/24 0010 06/15/24 0422 06/16/24 0444  WBC 4.0  --  3.8* 3.4*  NEUTROABS 2.6  --  2.4  --   HGB 8.5* 8.5* 8.6* 9.4*  HCT 28.9* 25.0* 27.9* 30.8*  MCV 118.0*  --  115.8* 116.2*  PLT 142*  --  142* 159   Cardiac Enzymes: No results for input(s): CKTOTAL, CKMB, CKMBINDEX, TROPONINI in the last 168 hours. BNP: Invalid input(s): POCBNP CBG: No results for input(s): GLUCAP in the last 168 hours. D-Dimer No results for input(s): DDIMER in the last 72 hours. Hgb A1c No results for input(s): HGBA1C in the last 72 hours. Lipid Profile No results for input(s): CHOL, HDL, LDLCALC, TRIG, CHOLHDL, LDLDIRECT in the last 72 hours. Thyroid  function studies No results for input(s): TSH, T4TOTAL, T3FREE, THYROIDAB in the last 72 hours.  Invalid input(s):  FREET3 Anemia work up No results for input(s): VITAMINB12, FOLATE, FERRITIN, TIBC, IRON , RETICCTPCT in the last 72 hours. Urinalysis    Component Value Date/Time   COLORURINE STRAW (A) 03/24/2024 0203   APPEARANCEUR CLEAR 03/24/2024 0203   LABSPEC 1.006 03/24/2024 9796  PHURINE 6.0 03/24/2024 0203   GLUCOSEU NEGATIVE 03/24/2024 0203   GLUCOSEU NEGATIVE 03/13/2023 1512   HGBUR NEGATIVE 03/24/2024 0203   BILIRUBINUR NEGATIVE 03/24/2024 0203   BILIRUBINUR neg 08/03/2023 1351   KETONESUR NEGATIVE 03/24/2024 0203   PROTEINUR NEGATIVE 03/24/2024 0203   UROBILINOGEN negative (A) 08/03/2023 1351   UROBILINOGEN 1.0 03/13/2023 1512   NITRITE NEGATIVE 03/24/2024 0203   LEUKOCYTESUR NEGATIVE 03/24/2024 0203   Sepsis Labs Recent Labs  Lab 06/15/24 0003 06/15/24 0422 06/16/24 0444  WBC 4.0 3.8* 3.4*   Microbiology Recent Results (from the past 240 hours)  Resp panel by RT-PCR (RSV, Flu A&B, Covid) Anterior Nasal Swab     Status: Abnormal   Collection Time: 06/14/24 12:10 AM   Specimen: Anterior Nasal Swab  Result Value Ref Range Status   SARS Coronavirus 2 by RT PCR POSITIVE (A) NEGATIVE Final    Comment: (NOTE) SARS-CoV-2 target nucleic acids are DETECTED.  The SARS-CoV-2 RNA is generally detectable in upper respiratory specimens during the acute phase of infection. Positive results are indicative of the presence of the identified virus, but do not rule out bacterial infection or co-infection with other pathogens not detected by the test. Clinical correlation with patient history and other diagnostic information is necessary to determine patient infection status. The expected result is Negative.  Fact Sheet for Patients: BloggerCourse.com  Fact Sheet for Healthcare Providers: SeriousBroker.it  This test is not yet approved or cleared by the [place]  FDA and  has been authorized for detection and/or  diagnosis of SARS-CoV-2 by FDA under an Emergency Use Authorization (EUA).  This EUA will remain in effect (meaning this test can be used) for the duration of  the COVID-19 declaration under Section 564(b)(1) of the A ct, 21 U.S.C. section 360bbb-3(b)(1), unless the authorization is terminated or revoked sooner.     Influenza A by PCR NEGATIVE NEGATIVE Final   Influenza B by PCR NEGATIVE NEGATIVE Final    Comment: (NOTE) The Xpert Xpress SARS-CoV-2/FLU/RSV plus assay is intended as an aid in the diagnosis of influenza from Nasopharyngeal swab specimens and should not be used as a sole basis for treatment. Nasal washings and aspirates are unacceptable for Xpert Xpress SARS-CoV-2/FLU/RSV testing.  Fact Sheet for Patients: BloggerCourse.com  Fact Sheet for Healthcare Providers: SeriousBroker.it  This test is not yet approved or cleared by the [place]  FDA and has been authorized for detection and/or diagnosis of SARS-CoV-2 by FDA under an Emergency Use Authorization (EUA). This EUA will remain in effect (meaning this test can be used) for the duration of the COVID-19 declaration under Section 564(b)(1) of the Act, 21 U.S.C. section 360bbb-3(b)(1), unless the authorization is terminated or revoked.     Resp Syncytial Virus by PCR NEGATIVE NEGATIVE Final    Comment: (NOTE) Fact Sheet for Patients: BloggerCourse.com  Fact Sheet for Healthcare Providers: SeriousBroker.it  This test is not yet approved or cleared by the [place]  FDA and has been authorized for detection and/or diagnosis of SARS-CoV-2 by FDA under an Emergency Use Authorization (EUA). This EUA will remain in effect (meaning this test can be used) for the duration of the COVID-19 declaration under Section 564(b)(1) of the Act, 21 U.S.C. section 360bbb-3(b)(1), unless the authorization is terminated  or revoked.  Performed at [facility], [address], [place], [place] [address]      Time coordinating discharge: 35 minutes  SIGNED:   Renato Applebaum, MD  [facility] 06/16/2024, 9:23 AM

## 2024-06-16 NOTE — Progress Notes (Signed)
   06/16/24 0000  BiPAP/CPAP/SIPAP  $ Non-Invasive Ventilator  Non-Invasive Vent Initial  $ Face Mask Small Yes  BiPAP/CPAP/SIPAP Pt Type Adult  BiPAP/CPAP/SIPAP Resmed  Mask Type Full face mask  Dentures removed? Not applicable  Mask Size Small  Respiratory Rate 18 breaths/min  Flow Rate 5 lpm  Patient Home Machine No  Patient Home Mask No  Patient Home Tubing No  Auto Titrate Yes  Minimum cmH2O 5 cmH2O  Maximum cmH2O 20 cmH2O  CPAP/SIPAP surface wiped down Yes

## 2024-06-16 NOTE — Telephone Encounter (Signed)
 Please schedule for hospital follow up with Dr. Annella or APP for respiratory failure. Can be in 2-4 weeks.  Thanks JD

## 2024-06-16 NOTE — Progress Notes (Signed)
 Discharge instructions given to patient questions asked and answered. Sylvia Everts RN

## 2024-06-16 NOTE — Telephone Encounter (Signed)
 Patient scheduled.

## 2024-06-16 NOTE — Telephone Encounter (Signed)
 Received call from pt stating that is canceling her appts for tomorrow as she was just discharged from being in the hospital with Covid. She states she is very fatigued and is canceling her appts for tomorrow. Advised that we would have had to cancel her appts anyway due to her Covid status. Advised to come back at her next schedule appts later in September. Pt voiced understanding. Dr. Federico and [place] [employer], PA-C made aware.

## 2024-06-16 NOTE — TOC Transition Note (Signed)
 Transition of Care [facility]) - Discharge Note  Patient Details  Name: Paula Ward MRN: [id number] Date of Birth: 07-20-1936  Transition of Care [facility]) CM/SW Contact:  Duwaine GORMAN Aran, LCSW Phone Number: 06/16/2024, 9:42 AM  Clinical Narrative: Care management consulted for heart failure screen. Patient has had 2 admissions and 0 ED visits in the past 6 months and patient's most recent echocardiogram showed EF 70-75%, so consult will be cleared at this time. Care management signing off.   Final next level of care: Home/Self Care Barriers to Discharge: No Barriers Identified  Patient Goals and CMS Choice Choice offered to / list presented to : NA  Discharge Plan and Services Additional resources added to the After Visit Summary for       DME Arranged: N/A DME Agency: NA  Social Drivers of Health (SDOH) Interventions SDOH Screenings   Food Insecurity: No Food Insecurity (06/15/2024)  Housing: Low Risk  (06/15/2024)  Transportation Needs: No Transportation Needs (06/15/2024)  Utilities: Not At Risk (06/15/2024)  Alcohol Screen: Low Risk  (04/02/2022)  Depression (PHQ2-9): Low Risk  (02/05/2024)  Financial Resource Strain: Low Risk  (04/02/2022)  Physical Activity: Inactive (04/02/2022)  Social Connections: Socially Integrated (06/15/2024)  Stress: No Stress Concern Present (04/02/2022)  Tobacco Use: Low Risk  (06/14/2024)   Readmission Risk Interventions    03/24/2024    2:22 PM 10/12/2022    9:05 AM 03/18/2022    3:45 PM  Readmission Risk Prevention Plan  Transportation Screening Complete Complete Complete  PCP or Specialist Appt within 3-5 Days Complete  Complete  HRI or Home Care Consult Complete  Complete  Social Work Consult for Recovery Care Planning/Counseling Complete  Complete  Palliative Care Screening Complete  Not Applicable  Medication Review [employer]) Complete Complete Complete  PCP or Specialist appointment within 3-5 days of discharge  Complete   HRI or Home Care  Consult  Complete   SW Recovery Care/Counseling Consult  Complete   Palliative Care Screening  Not Applicable   Skilled Nursing Facility  Complete

## 2024-06-16 NOTE — Progress Notes (Signed)
 Confirmed with Dr Perri patient to start paxlovid  today D Johnie RN

## 2024-06-17 ENCOUNTER — Inpatient Hospital Stay: Admitting: Physician Assistant

## 2024-06-17 ENCOUNTER — Inpatient Hospital Stay

## 2024-06-17 ENCOUNTER — Telehealth: Payer: Self-pay | Admitting: *Deleted

## 2024-06-17 NOTE — Transitions of Care (Post Inpatient/ED Visit) (Addendum)
 06/17/2024  Name: Paula Ward MRN: [id number] DOB: 09/08/1936  Today's TOC FU Call Status: Today's TOC FU Call Status:: Successful TOC FU Call Completed TOC FU Call Complete Date: 06/16/24 Patient's Name and Date of Birth confirmed.  Transition Care Management Follow-up Telephone Call Date of Discharge: 06/16/24 Discharge Facility: [facility]) Type of Discharge: Inpatient Admission Primary Inpatient Discharge Diagnosis:: COVID- 19 infection in setting of chronic Respiratory Failure/ recently revoked hospice services How have you been since you were released from the hospital?: Same (I don't feel great, and I am here with my paid caregiver.  I got all my medicine.  I can't schedule with Dr. Rollene right now, I need to go to the bathroom, please call me back in 15 minutes) Any questions or concerns?: No (Patient is difficult to understand; states I have problems with my voice; sounds to be uncomfortable -does not offer specific clinical complaints/ concerns; 34 minutes into TOC call, she abruptly says she can no longer talks/ requests call back in 15 minutes)  Patient difficult to understand, states this is just how my voice sounds, for a long time now With active/ attentive listening and extra time, patient repetition when necessary: was able to eventually understand most of what patient said; she sounds to be short of breath throughout TOC call and reports this is how I always am; and I am not any better with having this COVID now; confirms her private duty caregiver is currently present with her; confirms she is currently using her home O2 at 2-3 L/min, rescue inhaler as needed as well as her nebulizer, taking prescribed medications/ steroids for acute COVID infection; she does not sound to be in distress- but is short of breath as per reported baseline Attempted care coordination outreach in real-time with scheduling care guide to schedule hospital follow up PCP  appointment- however, patient abruptly ended [facility] call before this could be completed - care guide also attempted subsequent outreach to patient but was also unsuccessful in her attempt Was able with much effort/ extra time to complete full medication reconciliation/ review : however, patient abruptly ended TOC call after medication review, before full TOC able to be completed  Re-attempted calls to patient x 2: one went to voice mail; 3rd attempt later patient answered: but screamed into phone that it is still not a good time for me to talk to you Multiple unsuccessful attempts made to re-connect with patient- each one either unsuccessful or declined by patient: noted patient was previously established with [facility] longitudinal RN CM- sent message to PCP/ established longitudinal RN CM/ scheduling care guide to outreach patient promptly, given ultimately unsuccessful (full) TOC call today  Items Reviewed: Did you receive and understand the discharge instructions provided?: Yes (attempted to review with patient after completing medication reconciliation: however patient abruptly stated she need to go to the bathroom and asked that I call her back in 15 minutes) Medications obtained,verified, and reconciled?: Yes (Medications Reviewed) (Full medication reconciliation/ review completed; no concerns or discrepancies identified; confirmed patient obtained/ is taking all newly Rx'd medications as instructed; self-manages medications and denies questions/ concerns around medications today) Any new allergies since your discharge?: No Dietary orders reviewed?: No (patient abruptly ended TOC call after 34 minutes, requested call-back) Do you have support at home?: Yes People in Home [RPT]: spouse Name of Support/Comfort Primary Source: Reports independent in self-care activities; reports resides with spouse and has paid private duty caregiver from 1:00- 5:00 pm who assists as/ if  needed/ indicated  Medications  Reviewed Today: Medications Reviewed Today     Reviewed by Azriella Mattia M, RN (Registered Nurse) on 06/17/24 at 1414  Med List Status: <None>   Medication Order Taking? Sig Documenting Provider Last Dose Status Informant  acetaminophen  (TYLENOL ) 325 MG tablet [id number] Yes Take 2 tablets (650 mg total) by mouth every 6 (six) hours as needed for moderate pain. Christopher Savannah, PA-C  Active Self, Pharmacy Records           Med Note ALLEGRA, [place] I   Dju Aug 29, 2023 10:03 PM)    albuterol  (PROVENTIL ) (2.5 MG/3ML) 0.083% nebulizer solution [id number] Yes Take 3 mLs (2.5 mg total) by nebulization every 6 (six) hours as needed for wheezing or shortness of breath. Sonjia Held, MD  Active Self, Pharmacy Records  albuterol  (VENTOLIN  HFA) 108 [phone] Base) MCG/ACT inhaler [id number] Yes INHALE 2 PUFFS INTO THE LUNGS EVERY 6 HOURS AS NEEDED FOR WHEEZING OR SHORTNESS OF SHERIDA Rollene Almarie DELENA, MD  Active Self, Pharmacy Records  arformoterol  (BROVANA ) 15 MCG/2ML NEBU [id number] Yes Take 2 mLs (15 mcg total) by nebulization 2 (two) times daily. Hunsucker, Donnice SAUNDERS, MD  Active Self, Pharmacy Records  augmented betamethasone dipropionate (DIPROLENE-AF) 0.05 % cream [id number] Yes Apply 1 application  topically 2 (two) times daily as needed (for irritation). [provider]  Active Self, Pharmacy Records           Med Note ALLEGRA, [facility] I   Sat Aug 29, 2023  9:58 PM)    clobetasol ointment (TEMOVATE) 0.05 % [id number] Yes Apply 1 Application topically 2 (two) times daily as needed (for irritation). [provider]  Active Self, Pharmacy Records           Med Note ALLEGRA [facility] I   Sat Aug 29, 2023  9:59 PM)    cyanocobalamin  (VITAMIN B12) injection 1,000 mcg [id number]   Thayil, Irene T, PA-C  Active   dexamethasone  (DECADRON ) 6 MG tablet [id number] Yes Take 1 tablet (6 mg total) by mouth daily for 9 days. Raenelle Coria, MD  Active   diltiazem  (CARDIZEM ) 30 MG tablet  [id number] Yes Take 1 tablet (30 mg total) by mouth every 12 (twelve) hours. Emelia Josefa HERO, NP  Active Self, Pharmacy Records  docusate sodium  (COLACE) 100 MG capsule [id number] Yes Take 100 mg by mouth daily. [provider]  Active Self, Pharmacy Records  famotidine  (PEPCID ) 20 MG tablet [id number] Yes TAKE 1 TABLET BY MOUTH AT BEDTIME Nandigam, Kavitha V, MD  Active Self, Pharmacy Records  fluticasone  (FLONASE ) 50 MCG/ACT nasal spray [id number] Yes USE 1 SPRAY(S) IN EACH NOSTRIL ONCE DAILY AS NEEDED FOR ALLERGIES Rollene Almarie DELENA, MD  Active Self, Pharmacy Records  furosemide  (LASIX ) 20 MG tablet [id number] Yes Take 2 tablets ( 40 mg ) every morning may take extra 2 tablets for weight gain of 3 lbs Pietro Redell RAMAN, MD  Active Self, Pharmacy Records  ipratropium (ATROVENT ) 0.02 % nebulizer solution [id number] Yes Take 2.5 mLs (0.5 mg total) by nebulization 2 (two) times daily. Hunsucker, Donnice SAUNDERS, MD  Active Self, Pharmacy Records  latanoprost  (XALATAN ) 0.005 % ophthalmic solution [id number] Yes Place 1 drop into both eyes at bedtime. [provider]  Active Self, Pharmacy Records  Multiple Vitamin (MULTIVITAMIN WITH MINERALS) TABS tablet [id number] Yes Take 1 tablet by mouth daily. [provider]  Active Self, Pharmacy Records  nirmatrelvir /ritonavir , renal dosing, (PAXLOVID ) 10 x 150 MG & 10 x 100MG  TABS [id number] Yes Take 2  tablets by mouth 2 (two) times daily for 5 days. (Take nirmatrelvir  150 mg one tablet twice daily for 5 days and ritonavir  100 mg one tablet twice daily for 5 days) Patient GFR is 34 [name], FNP  Active Self, Pharmacy Records           Med Note RENNIS, DUROJAHYE' R   Wed Jun 15, 2024  3:53 AM) Has not started  Nutritional Supplements (ENSURE HIGH PROTEIN) LIQD [id number] Yes Take 0.5 Bottles by mouth daily as needed (for supplementation). [provider]  Active Self, Pharmacy Records  OXYGEN  [id number] Yes Inhale 6 L/min  into the lungs continuous. [provider]  Active Self, Pharmacy Records           Med Note MARISA, NATHANEL SAILOR   Wed Mar 23, 2024  7:28 PM)    revefenacin  (YUPELRI ) 175 MCG/3ML nebulizer solution [id number]  Take 3 mLs (175 mcg total) by nebulization daily.  Patient not taking: Reported on 06/17/2024   Hunsucker, Donnice SAUNDERS, MD  Active Self, Pharmacy Records  spironolactone  (ALDACTONE ) 25 MG tablet [id number] Yes Take 1/2 tablet ( 12.5 mg ) every morning Pietro Redell RAMAN, MD  Active Self, Pharmacy Records           Med Note [facility], DUROJAHYE' R   Wed Jun 15, 2024  3:37 AM)    sucralfate  (CARAFATE ) 1 GM/10ML suspension [id number] Yes TAKE 10 MLS BY MOUTH EVERY 6 HOURS AS NEEDED FOR REFLUX Nandigam, Kavitha V, MD  Active Self, Pharmacy Records  triamcinolone  cream (KENALOG ) 0.1 % [id number] Yes Apply 1 Application topically See admin instructions. Apply to affected leg(s) after showers and up to 2 additional times a day as needed for irritation [provider]  Active Self, Pharmacy Records           Med Note ALLEGRA, [facility] I   Sat Aug 29, 2023 10:03 PM)             Home Care and Equipment/Supplies: Were Home Health Services Ordered?: No Any new equipment or medical supplies ordered?: No  Functional Questionnaire: Do you need assistance with bathing/showering or dressing?: Yes (reports paid private duty caregiver assists) Do you need assistance with meal preparation?: Yes (reports paid private duty caregiver assists) Do you need assistance with eating?: No Do you have difficulty maintaining continence: Yes (reports paid private duty caregiver assists) Do you need assistance with getting out of bed/getting out of a chair/moving?: Yes (reports paid private duty caregiver assists) Do you have difficulty managing or taking your medications?: No  Follow up appointments reviewed: PCP Follow-up appointment confirmed?: No (Attempted care coordination outreach in real-time with  scheduling care guide to schedule hospital follow up PCP appointment- however, patient abruptly ended [facility] call before this could be completed) MD Provider Line Number:[phone] Given: No (verified well-established with current PCP) Specialist Hospital Follow-up appointment confirmed?: Yes Date of Specialist follow-up appointment?: 07/04/24 Follow-Up Specialty Provider:: Cardiology provider office Do you need transportation to your follow-up appointment?: No Do you understand care options if your condition(s) worsen?: Yes-patient verbalized understanding  SDOH Interventions Today    Flowsheet Row Most Recent Value  SDOH Interventions   Food Insecurity Interventions Patient Declined  [patient abruptly ended TOC call prior to assessment being able to be completed]  Housing Interventions Patient Declined  [patient abruptly ended [facility] call prior to assessment being able to be completed]  Transportation Interventions Intervention Not Indicated  [reports private duty caregiver provides transportation]  Utilities Interventions Patient Declined  [  patient abruptly ended TOC call prior to assessment being able to be completed]   See TOC assessment tabs for additional assessment/ TOC intervention information  Unable to connect with/ engage patient to offer TOC 30-day program  See narrative above: PCP/ established [facility] RN CM/ scheduling care guide notified to contact patient to re-schedule with longitudinal RN CM/ schedule hospital follow up PCP office visit  Pls call/ message for questions,  Timea Breed Mckinney Kristiana Jacko, RN, BSN, CCRN Alumnus RN Care Manager  Transitions of Care  [facility] - [facility] [phone]: direct office

## 2024-06-18 DIAGNOSIS — J449 Chronic obstructive pulmonary disease, unspecified: Secondary | ICD-10-CM | POA: Diagnosis not present

## 2024-06-18 DIAGNOSIS — J9620 Acute and chronic respiratory failure, unspecified whether with hypoxia or hypercapnia: Secondary | ICD-10-CM | POA: Diagnosis not present

## 2024-06-23 ENCOUNTER — Other Ambulatory Visit: Payer: Self-pay | Admitting: Gastroenterology

## 2024-06-28 ENCOUNTER — Encounter: Payer: Self-pay | Admitting: Internal Medicine

## 2024-06-28 ENCOUNTER — Ambulatory Visit (INDEPENDENT_AMBULATORY_CARE_PROVIDER_SITE_OTHER): Admitting: Internal Medicine

## 2024-06-28 VITALS — BP 118/82 | HR 85 | Temp 97.7°F | Ht 63.0 in | Wt 95.0 lb

## 2024-06-28 DIAGNOSIS — D638 Anemia in other chronic diseases classified elsewhere: Secondary | ICD-10-CM | POA: Diagnosis not present

## 2024-06-28 DIAGNOSIS — D649 Anemia, unspecified: Secondary | ICD-10-CM | POA: Diagnosis not present

## 2024-06-28 DIAGNOSIS — I5032 Chronic diastolic (congestive) heart failure: Secondary | ICD-10-CM | POA: Diagnosis not present

## 2024-06-28 DIAGNOSIS — I10 Essential (primary) hypertension: Secondary | ICD-10-CM | POA: Diagnosis not present

## 2024-06-28 DIAGNOSIS — J9611 Chronic respiratory failure with hypoxia: Secondary | ICD-10-CM

## 2024-06-28 DIAGNOSIS — F4321 Adjustment disorder with depressed mood: Secondary | ICD-10-CM

## 2024-06-28 DIAGNOSIS — R682 Dry mouth, unspecified: Secondary | ICD-10-CM

## 2024-06-28 DIAGNOSIS — J3089 Other allergic rhinitis: Secondary | ICD-10-CM | POA: Diagnosis not present

## 2024-06-28 LAB — COMPREHENSIVE METABOLIC PANEL WITH GFR
ALT: 14 U/L (ref 0–35)
AST: 20 U/L (ref 0–37)
Albumin: 3.9 g/dL (ref 3.5–5.2)
Alkaline Phosphatase: 60 U/L (ref 39–117)
BUN: 66 mg/dL — ABNORMAL HIGH (ref 6–23)
CO2: 38 meq/L — ABNORMAL HIGH (ref 19–32)
Calcium: 8.8 mg/dL (ref 8.4–10.5)
Chloride: 94 meq/L — ABNORMAL LOW (ref 96–112)
Creatinine, Ser: 1.78 mg/dL — ABNORMAL HIGH (ref 0.40–1.20)
GFR: 25.65 mL/min — ABNORMAL LOW (ref >60.00)
Glucose, Bld: 106 mg/dL — ABNORMAL HIGH (ref 70–99)
Potassium: 4.2 meq/L (ref 3.5–5.1)
Sodium: 141 meq/L (ref 135–145)
Total Bilirubin: 0.6 mg/dL (ref 0.2–1.2)
Total Protein: 6.7 g/dL (ref 6.0–8.3)

## 2024-06-28 LAB — CBC
HCT: 27.4 % — ABNORMAL LOW (ref 36.0–46.0)
Hemoglobin: 9 g/dL — ABNORMAL LOW (ref 12.0–15.0)
MCHC: 33.1 g/dL (ref 30.0–36.0)
MCV: 106.6 fl — ABNORMAL HIGH (ref 78.0–100.0)
Platelets: 175 K/uL (ref 150.0–400.0)
RBC: 2.57 Mil/uL — ABNORMAL LOW (ref 3.87–5.11)
RDW: 13.1 % (ref 11.5–15.5)
WBC: 10 K/uL (ref 4.0–10.5)

## 2024-06-28 MED ORDER — TRIAMCINOLONE ACETONIDE 0.1 % MT PSTE: 1.0000 | PASTE | Freq: Two times a day (BID) | OROMUCOSAL | 12 refills | Status: AC

## 2024-06-28 MED ORDER — ALPRAZOLAM 0.25 MG PO TABS: 0.2500 mg | ORAL_TABLET | Freq: Every evening | ORAL | 1 refills | Status: AC | PRN

## 2024-06-28 NOTE — Progress Notes (Signed)
 "  Subjective:   Patient ID: Paula Ward, female    DOB: April 24, 1936, 88 y.o.   MRN: [id number]  Discussed the use of AI scribe software for clinical note transcription with the patient, who gave verbal consent to proceed.  History of Present Illness Paula Ward is an 88 year old female who presents with oral pain and dry mouth after recent hospital stay.  She has been experiencing significant oral pain for the past two days, describing it as 'killing' her. The pain is exacerbated by eating. She has been using magic mouthwash and baking soda to alleviate the discomfort and inquires about topical treatments to apply to her mouth.  She mentions having dry lips and a constantly dripping nose, which she finds bothersome. She uses a CPAP machine and takes Xanax  at night, which makes her drowsy the next day. She has learned to use the CPAP mask effectively and can sleep on either side. She feels cold all the time and needs to keep warm.  She describes a history of heartburn, which she has not experienced since starting medication. She also mentions a recent hospital visit related to her heart condition, during which her daughter provided care.  She expresses concerns about memory issues, mentioning difficulty with directions and needing to write things down more often. She attributes some of these issues to 'getting Alzheimer's'.  She discusses arthritis, particularly in her fingers, which limits her ability to open things. She relies on others to assist with tasks that require hand strength.  She describes a complex social situation involving caregivers and family dynamics, including conflicts with a caregiver who was initially hired to look after her but is now more focused on her husband. She feels her independence is being challenged and expresses frustration with the situation.  Review of Systems  Constitutional:  Positive for activity change, appetite change and fatigue.  HENT: Negative.     Eyes: Negative.   Respiratory:  Positive for shortness of breath. Negative for cough and chest tightness.   Cardiovascular:  Negative for chest pain, palpitations and leg swelling.  Gastrointestinal:  Negative for abdominal distention, abdominal pain, constipation, diarrhea, nausea and vomiting.  Musculoskeletal:  Positive for arthralgias.  Skin: Negative.   Neurological: Negative.   Psychiatric/Behavioral:  Positive for dysphoric mood.     Objective:  Physical Exam Constitutional:      Appearance: She is well-developed.  HENT:     Head: Normocephalic and atraumatic.  Cardiovascular:     Rate and Rhythm: Normal rate and regular rhythm.     Comments: O2 in place via cannula Pulmonary:     Effort: Pulmonary effort is normal. No respiratory distress.     Breath sounds: Normal breath sounds. No wheezing or rales.  Abdominal:     General: Bowel sounds are normal. There is no distension.     Palpations: Abdomen is soft.     Tenderness: There is no abdominal tenderness.  Musculoskeletal:     Cervical back: Normal range of motion.  Skin:    General: Skin is warm and dry.  Neurological:     Mental Status: She is alert and oriented to person, place, and time.     Coordination: Coordination abnormal.     Comments: wheelchair     Vitals:   06/28/24 1502  BP: 118/82  Pulse: 85  Temp: 97.7 F (36.5 C)  TempSrc: Oral  SpO2: 98%  Weight: 95 lb (43.1 kg)  Height: 5' 3 (1.6 m)  Assessment and Plan Assessment & Plan Mouth pain and oral mucosal irritation   Mouth pain and irritation are likely due to dryness. She uses magic mouthwash and baking soda for relief. Provide triamcinolone  dental paste for application inside the mouth and on the lips.  Chronic respiratory failure   Managed with Oxygen .  Chronic heart failure Not volume overloaded today but needs recheck CBC and CMP after diuresis inpatient recently.   Arthritis of the hands   Arthritis affects two fingers,  causing difficulty with tasks.  Chronic rhinitis   Chronic rhinitis with constant nasal dripping, likely exacerbated by environmental factors. Discussed this will likely persist and her chronic O2 contributes.    "

## 2024-06-28 NOTE — Patient Instructions (Addendum)
 We have sent in the alprazolam  to use at night time.   We are checking labs today.  We have sent in the dental paste to use on the mouth and lips.

## 2024-06-29 ENCOUNTER — Ambulatory Visit: Payer: Self-pay | Admitting: Internal Medicine

## 2024-06-30 ENCOUNTER — Encounter: Payer: Self-pay | Admitting: Internal Medicine

## 2024-06-30 ENCOUNTER — Inpatient Hospital Stay

## 2024-06-30 ENCOUNTER — Other Ambulatory Visit: Payer: Self-pay | Admitting: Gastroenterology

## 2024-06-30 NOTE — Assessment & Plan Note (Signed)
 Using oxygen  and stable today.

## 2024-06-30 NOTE — Assessment & Plan Note (Signed)
 Checking CBC and CMP today given recent diuresis inpatient. Volume status appears to be euvolemic today.

## 2024-06-30 NOTE — Assessment & Plan Note (Signed)
 Mouth pain and irritation are likely due to dryness. She uses magic mouthwash and baking soda for relief. Provide triamcinolone  dental paste for application inside the mouth and on the lips.

## 2024-06-30 NOTE — Assessment & Plan Note (Signed)
 Chronic rhinitis with constant nasal dripping, likely exacerbated by environmental factors. Discussed this will likely persist and her chronic O2 contributes.

## 2024-06-30 NOTE — Assessment & Plan Note (Signed)
 Needs CBC today for stability. No clinical signs of bleeding.

## 2024-06-30 NOTE — Assessment & Plan Note (Signed)
 BP at goal today checking CMP for stability given recent diuresis.

## 2024-06-30 NOTE — Progress Notes (Signed)
 Called patient and LVM 2nd attempt

## 2024-06-30 NOTE — Assessment & Plan Note (Signed)
 She is struggling more with this with recent hospital stay and some dynamics between family members. She rarely uses alprazolam  at night time new rx done.

## 2024-07-01 NOTE — Progress Notes (Deleted)
 Cardiology Clinic Note   Patient Name: Paula Ward Date of Encounter: 07/01/2024  Primary Care Provider:  Rollene Almarie LABOR, MD Primary Cardiologist:  Redell Shallow, MD  Patient Profile    Paula Ward 88 year old female presents the clinic today for follow-up evaluation of her lower extremity edema/chronic diastolic CHF, and posthospital follow-up.  Past Medical History    Past Medical History:  Diagnosis Date   Anemia    Angiodysplasia of stomach and duodenum    Aortic stenosis    Arthus phenomenon    AVM (arteriovenous malformation)    Blood transfusion without reported diagnosis    Breast cancer (HCC) 1989   Left   Candida esophagitis (HCC)    Cataract    Chronic respiratory failure (HCC)    CKD stage 3b, GFR 30-44 ml/min (HCC)    Corneal edema    Corneal epithelial basement membrane dystrophy    CREST syndrome (HCC)    GERD (gastroesophageal reflux disease)    w/ HH   Interstitial lung disease (HCC)    Nodule of kidney    Pericardial effusion    PONV (postoperative nausea and vomiting)    Pulmonary embolus (HCC) 2003   Pulmonary hypertension (HCC)    Rectal incontinence    Renal cell carcinoma (HCC)    Scleroderma (HCC)    Tubular adenoma of colon    Uterine polyp    Past Surgical History:  Procedure Laterality Date   APPENDECTOMY  1962   BREAST LUMPECTOMY  1989   left   CATARACT EXTRACTION, BILATERAL Bilateral 12/2013   COLONOSCOPY WITH PROPOFOL  N/A 04/15/2021   Procedure: COLONOSCOPY WITH PROPOFOL ;  Surgeon: Teressa Toribio SQUIBB, MD;  Location: [facility];  Service: Endoscopy;  Laterality: N/A;   ENTEROSCOPY N/A 01/18/2016   Procedure: ENTEROSCOPY;  Surgeon: Gustav Shila GAILS, MD;  Location: [facility];  Service: Endoscopy;  Laterality: N/A;   ENTEROSCOPY N/A 05/24/2018   Procedure: ENTEROSCOPY;  Surgeon: Charlanne Groom, MD;  Location: [facility];  Service: Endoscopy;  Laterality: N/A;   ENTEROSCOPY N/A 03/29/2021   Procedure: ENTEROSCOPY;   Surgeon: Albertus Gordy HERO, MD;  Location: [facility];  Service: Gastroenterology;  Laterality: N/A;   ENTEROSCOPY N/A 04/13/2021   Procedure: ENTEROSCOPY;  Surgeon: Leigh Elspeth SQUIBB, MD;  Location: [facility];  Service: Gastroenterology;  Laterality: N/A;   ESOPHAGOGASTRODUODENOSCOPY (EGD) WITH PROPOFOL  N/A 12/21/2015   Procedure: ESOPHAGOGASTRODUODENOSCOPY (EGD) WITH PROPOFOL ;  Surgeon: Norleen LOISE Kiang, MD;  Location: [facility];  Service: Endoscopy;  Laterality: N/A;   HOT HEMOSTASIS N/A 05/24/2018   Procedure: HOT HEMOSTASIS (ARGON PLASMA COAGULATION/BICAP);  Surgeon: Charlanne Groom, MD;  Location: [facility];  Service: Endoscopy;  Laterality: N/A;   HOT HEMOSTASIS N/A 03/29/2021   Procedure: HOT HEMOSTASIS (ARGON PLASMA COAGULATION/BICAP);  Surgeon: Albertus Gordy HERO, MD;  Location: [facility];  Service: Gastroenterology;  Laterality: N/A;   HOT HEMOSTASIS N/A 04/13/2021   Procedure: HOT HEMOSTASIS (ARGON PLASMA COAGULATION/BICAP);  Surgeon: Leigh Elspeth SQUIBB, MD;  Location: [facility];  Service: Gastroenterology;  Laterality: N/A;   INTRAMEDULLARY (IM) NAIL INTERTROCHANTERIC Left 10/11/2022   Procedure: INTRAMEDULLARY (IM) NAIL INTERTROCHANTERIC;  Surgeon: Melodi Lerner, MD;  Location: [facility];  Service: Orthopedics;  Laterality: Left;   IR GENERIC HISTORICAL  06/05/2016   IR RADIOLOGIST EVAL & MGMT 06/05/2016 Marcey Moan, MD [facility] INTERV RAD   IVC Filter     KIDNEY SURGERY     left -laser surgery by Dr. moan- 5 yrs ago no removal   RIGHT/LEFT HEART  CATH AND CORONARY ANGIOGRAPHY N/A 04/25/2022   Procedure: RIGHT/LEFT HEART CATH AND CORONARY ANGIOGRAPHY;  Surgeon: Wonda Sharper, MD;  Location: [facility];  Service: Cardiovascular;  Laterality: N/A;   SCHLEROTHERAPY  05/24/2018   Procedure: WALDEMAR;  Surgeon: Charlanne Groom, MD;  Location: [facility];  Service: Endoscopy;;   SUBMUCOSAL TATTOO INJECTION  04/13/2021   Procedure: SUBMUCOSAL TATTOO INJECTION;  Surgeon:  Leigh Elspeth SQUIBB, MD;  Location: [facility];  Service: Gastroenterology;;   TONSILLECTOMY     TUBAL LIGATION      Allergies  Allergies  Allergen Reactions   Codeine  Nausea Only    Hallucinations, too   Other Nausea And Vomiting and Other (See Comments)    -mycin antibiotics.   Also cause hallucinations.   Erythromycin Nausea And Vomiting   Iodinated Contrast Media Other (See Comments)    Renal issues   Lisinopril Other (See Comments)    Pt doesn't remember reaction   Mirtazapine  Other (See Comments)    Threw me into orbit   Mycophenolate Mofetil Nausea Only    Made me nervous   Sulfa  Antibiotics Other (See Comments)    Unknown reaction    History of Present Illness    Paula Ward 88 year old female has a PMH of essential hypertension, pulmonary hypertension, pericardial effusion, chronic diastolic CHF, AVM, moderate aortic stenosis, orthostatic hypotension, interstitial lung disease, GERD, CKD stage III, PE, incontinence, scleroderma, anemia, generalized weakness, vitamin B12 deficiency, and macrocytic anemia.  Her PMH also includes gastrointestinal bleeding secondary to AVM malformations which required IVC filter placement and breast CA.  She underwent cardiac catheterization 7/23 which showed no obstructive coronary disease, pulmonary hypertension, moderate aortic stenosis with a mean gradient of 16 mmHg.     She was seen in the emergency room on 06/04/2023 due to increased shortness of breath, chest tightness, and lower extremity edema.  Her oxygen  saturation was noted to be 80% on room air.  Her BNP was 2680.  Her chest x-ray showed stable cardiomegaly and pericardial effusion with no pulmonary edema or pulmonary effusion.  Echocardiogram during that time showed LVEF of 70-75%, moderate concentric LVH, mildly reduced RV function and severely dilated LA.  Large pericardial effusion was noted.  This was slightly increased from June.  There was no evidence of tamponade  or severe MAC and moderate AS was noted.  She received IV diuresis and was discharged in stable condition 06/06/2023.  She was seen in follow-up by Katlyn West NP on 06/19/2023.  She reported that she was doing well from a cardiac standpoint.  She denied chest pain and felt her breathing had returned to baseline.  She had been monitoring her weights at home which were stable.  She was having regular lab work at the cancer center and had planned lab work on 06/30/2023.  She contacted the nurse triage line 08/04/2023 and reported that she had increased lower extremity swelling.  She reported a 10 pound weight gain since 8/15 and another 2 pound weight gain since 9/25.  She was taking furosemide  40 mg once daily.  She continued to use lower extremity support stockings.  She was instructed to increase her furosemide  to 40 mg twice daily.  She was added to my schedule.  She presented to the clinic 08/06/23  for follow-up evaluation and stated she noticed a 6 pound weight gain overnight.  She reported that she was around 100 pounds earlier in the week.  Today in the office her weight is 108.2 pounds.  She reported that she was not able to take 40 mg of Lasix  2 times per day so she has been taking 60 mg of Lasix  for the past 2 days.  Her right leg is greater than her left.  She has noted to have +1-2 pitting edema to her knee her right leg and generalized nonpitting edema on her left leg.  I will continued her 60 mg of Lasix  x 4 days and give her 20 mill equivalents of potassium daily. I planned repeat  BMP 1 week and planned follow-up in 1 week.   She had follow-up appointment with pulmonology that week and was stable using 3 L of oxygen  nasal cannula while active.  She presented to the emergency department on 08/13/2023.  She was diagnosed with right lower extremity cellulitis.  She received IV antibiotics and diuresis.  Her furosemide  was transitioned to torsemide .  Her echocardiogram showed known severe LVH, G1  DD, large pericardial effusion without tamponade.  Her lower extremity ultrasound was negative for DVT.  There was low suspicion for true cellulitis however her Keflex  was continued at discharge.  She presented to the clinic  08/21/23 for follow-up evaluation and stated she had not had good urine output with torsemide .  She had been monitoring her weight at home.  She reported that when she left the hospital her weight was 93-1/2 pounds.  It had steadily increased up to 106 pounds today.  Our clinic scale showed 108 pounds.  She had been weighing after voiding in the morning with the same shoes on.  She presented with her son who asked several questions about her fluid retention and pulmonary hypertension.  All questions were answered.  I asked her to continue daily weights, stopped torsemide  and resumed furosemide  60 mg daily, ordered BMP, kept follow-up for November 14, and asked her follow-up with nephrology as scheduled.  She was admitted to the hospital on 08/29/2023.  Her weight was noted to be around 102 pounds 12 ounces.  Her echocardiogram 08/31/2023 showed an LVEF of 60-65%, mildly dilated left atria, severe mitral annular calcification with mitral stenosis.  Severe calcification of the aortic valve was also noted.  She was noted to have large circumferential pericardial effusion.  No RV diastolic collapse was noted.  She had been readmitted for weight gain, orthopnea, edema.  Her chest x-ray showed improving but persistent CHF and small pleural effusion.  She received IV diuresis.  Her SGLT2 inhibitor was restarted 08/31/2023.  She was continued on spironolactone .  Close follow-up was recommended.  She presented to the clinic 09/09/23 for follow-up evaluation and stated she felt much better  since being discharged from the hospital.  Her weight had been stable at home.  She did have some bilateral ankle edema.  She had been compliant with her medications.  She had been stable with her weight.  We  reviewed her medications and she felt much better since being started on Jardiance .  We reviewed her hospitalizations and diastolic heart failure.  By discussed referral for palliative care.  She agreed to meet with them and discuss options for management/treatment.  I had her continue her weight log, gave iron  rich foods list and asked her to pare them with vitamin C. We plan follow-up in 2 to 3 months.  She reported that she had upcoming appointment with hematology.   She was not able to tolerate Jardiance .  It was discontinued.  I changed her furosemide  to 60 mg Monday Wednesday Friday and continue to  her furosemide  40 mg dosing on Tuesday Thursday Saturday Sunday.  I planned BMP with return to the clinic.      She presented to the clinic 10/30/23 for follow-up evaluation and stated she was not doing well with Jardiance .  She had been maintaining a weight log which showed a weight of 98.2 pounds at home.  Her weight in the clinic was 101.8 pounds.  She reported that she had not been taking spironolactone  for about 3 to 4 weeks.  I refilled her spironolactone .  She was on 3-4 L of oxygen .  She had upcoming appointment with pulmonology.  She did note that she was somewhat fatigued.  She had upcoming echocardiogram planned for February.  I  decreased her Lasix  to 40 mg daily,  ordered BMP and planned follow-up in 2 to 4 months.  She presented to the emergency department 12/04/2023.  With large pericardial effusion that had been diagnosed/seen on echo 12/03/2023.  Her EKG at that time showed sinus rhythm with PACs 80 bpm.  She was without complaint on exam.  She did note some mild shortness of breath.  She was maintained on 2 L of oxygen .  She denied any active chest discomfort.  She denied fever, productive cough, lightheadedness, dizziness, nausea vomiting and back pain.  Rales were noted at lung bases.  Chest x-ray showed cardiomegaly which was associated with underlying pericardial effusion.  Mild pulmonary  edema was noted.  Dr. Verlin evaluated patient and felt that her pericardial effusion was chronic in nature.  She presented to the clinic 12/28/23 for follow-up evaluation and stated she was now using 3 L of oxygen  via nasal cannula with activity.  In the office  she was not using her oxygen  in the exam room.  Her oxygen  saturation was 94%.  Her blood pressure was well-controlled.  We reviewed her recent echocardiogram and emergency department visit.  She reported that she had an appointment with nephrology on Wednesday.  She was tolerating her medications well without side effects. Follow-up in 4 to 6 months was planned.  She presented to the clinic 06/14/24 for follow-up evaluation and stated she  had been requiring increased nasal cannula oxygen .  She reported compliance with her CPAP.  She had been using this with naps as well.  She did note that she was having increased confusion and shortness of breath.  She noted that morning she took her furosemide  along with her spironolactone .  Her weight was slightly up from previous (about 1 pound) on exam she had bilateral ankle/shin edema.  She had Rales throughout all lung fields.  In the clinic today she was using 5 L nasal cannula.  She noted that she had the same symptoms prior to her previous hospitalization.  She requested that she be placed on BiPAP after she touched base with her husband and family.  We reviewed her previous clinic visit and past medical history.  We reviewed her recent lab work which shows stable CO2.  I  contacted card master and planned follow-up after hospitalization.  I  recommended that she have BiPAP, IV diuresis, CBC, CMP, and have palliative care/ hospice care consult.  She was diagnosed with COVID-19.  She was admitted for monitoring.  It was felt that her acute on chronic hypoxic respiratory failure was exacerbated by COVID-19 infection.  She was started on Decadron  as well as remdesivir .  She was noted to have rapid clinical  improvement.  She received IV diuresis with good clinical response.  She  was instructed to continue Decadron  for 10 days and Paxlovid  for 5 days.  She was admitted on 05/26/2024 and discharged on 06/16/2024.  She followed up with her PCP on 06/28/2024.  Her CMP showed elevated creatinine and a GFR of 25.65.  Repeat BMP in 2 weeks was recommended.  Her hemoglobin and hematocrit were fairly stable.  She presents to the clinic today for follow-up evaluation and states***.  *** denies chest pain, shortness of breath, lower extremity edema, fatigue, palpitations, melena, hematuria, hemoptysis, diaphoresis, weakness, presyncope, syncope, orthopnea, and PND.    Home Medications    Prior to Admission medications   Medication Sig Start Date End Date Taking? Authorizing Provider  acetaminophen  (TYLENOL ) 325 MG tablet Take 2 tablets (650 mg total) by mouth every 6 (six) hours as needed for moderate pain. 07/18/22   Christopher Savannah, PA-C  albuterol  (VENTOLIN  HFA) 108 (90 Base) MCG/ACT inhaler INHALE 2 PUFFS INTO THE LUNGS EVERY 6 HOURS AS NEEDED FOR WHEEZING OR SHORTNESS OF BREATH 03/13/23   Rollene Almarie LABOR, MD  augmented betamethasone dipropionate (DIPROLENE-AF) 0.05 % cream Apply topically 2 (two) times daily as needed. 07/13/23   [provider]  clobetasol ointment (TEMOVATE) 0.05 % Apply 1 Application topically 2 (two) times daily. 04/20/23   [provider]  Cyanocobalamin  (VITAMIN B-12) 1000 MCG SUBL Place 1,000 mcg under the tongue. Taking twice a week    [provider]  docusate sodium  (COLACE) 100 MG capsule Take 1 capsule (100 mg total) by mouth 2 (two) times daily. Patient taking differently: Take 100 mg by mouth 2 (two) times daily. Pt takes every morning 10/15/22   Edmisten, Kristie L, PA  famotidine  (PEPCID ) 20 MG tablet Take 1 tablet (20 mg total) by mouth at bedtime. 02/10/23   Nandigam, Kavitha V, MD  fluticasone  (FLONASE ) 50 MCG/ACT nasal spray Place 1 spray into both  nostrils daily as needed for allergies. Patient taking differently: Place 1 spray into both nostrils in the morning and at bedtime. 05/15/22   Rollene Almarie LABOR, MD  furosemide  (LASIX ) 20 MG tablet Take 2 tablets (40 mg total) by mouth daily. 06/19/23   West, Katlyn D, NP  guaiFENesin  (MUCINEX ) 600 MG 12 hr tablet Take 300 mg by mouth daily.    [provider]  ipratropium (ATROVENT ) 0.03 % nasal spray Place 2 sprays into both nostrils every 12 (twelve) hours. 05/12/22   Kara Dorn NOVAK, MD  latanoprost  (XALATAN ) 0.005 % ophthalmic solution Place 1 drop into both eyes at bedtime. 09/23/22   [provider]  magic mouthwash SOLN Take 10 mLs by mouth 4 (four) times daily as needed for mouth pain. 03/22/22   Jillian Buttery, MD  mirtazapine  (REMERON ) 15 MG tablet Take 1 tablet (15 mg total) by mouth at bedtime. 03/13/23   Rollene Almarie LABOR, MD  montelukast  (SINGULAIR ) 10 MG tablet TAKE 1 TABLET BY MOUTH AT BEDTIME 07/31/23   Rollene Almarie LABOR, MD  Multiple Vitamin (MULTIVITAMIN) capsule Take 1 capsule by mouth daily.    [provider]  mupirocin  ointment (BACTROBAN ) 2 % Apply 1 Application topically 2 (two) times daily as needed (dry skin).    [provider]  OPSUMIT  10 MG tablet Take 1 tablet (10 mg total) by mouth daily. 07/07/23   Parrett, Madelin RAMAN, NP  OXYGEN  Inhale into the lungs. 2 LMP at night and upon exertion    [provider]  pantoprazole  (PROTONIX ) 40 MG tablet Take 1 tablet (40 mg total) by mouth daily.  02/10/23   Nandigam, Kavitha V, MD  polyethylene glycol (MIRALAX  / GLYCOLAX ) 17 g packet Take 17 g by mouth daily. 10/15/22   Edmisten, Kristie L, PA  spironolactone  (ALDACTONE ) 25 MG tablet Take 1 tablet (25 mg total) by mouth daily. 08/03/23   Merlynn Niki FALCON, FNP  sucralfate  (CARAFATE ) 1 GM/10ML suspension TAKE 10 MLS BY MOUTH EVERY 6 HOURS AS NEEDED FOR REFLUX 02/11/23   Nandigam, Kavitha V, MD  triamcinolone  cream (KENALOG ) 0.1 %  Apply 1 Application topically 2 (two) times daily. 07/07/23   [provider]    Family History    Family History  Problem Relation Age of Onset   Bladder Cancer Father    Diabetes Father    Prostate cancer Father    Alzheimer's disease Mother    Diabetes Sister    Lung cancer Sister         smoker   Esophageal cancer Paternal Uncle    Colon cancer Neg Hx    She indicated that her mother is deceased. She indicated that her father is deceased. She indicated that the status of her sister is unknown. She indicated that the status of her paternal uncle is unknown. She indicated that the status of her neg hx is unknown.  Social History    Social History   Socioeconomic History   Marital status: Married    Spouse name: Not on file   Number of children: 2   Years of education: Not on file   Highest education level: Not on file  Occupational History   Occupation: Retired  Tobacco Use   Smoking status: Never   Smokeless tobacco: Never  Vaping Use   Vaping status: Never Used  Substance and Sexual Activity   Alcohol use: No   Drug use: No   Sexual activity: Not Currently  Other Topics Concern   Not on file  Social History Narrative   Married '611 son- '65, 1 daughter '63; 6 children (2 adopted)SO- SOBRetirement- doing well. Marriage in good health. Patient has never smoked. Alcohol use- noPt gets regular exercise   Social Drivers of Health   Financial Resource Strain: Low Risk  (04/02/2022)   Overall Financial Resource Strain (CARDIA)    Difficulty of Paying Living Expenses: Not hard at all  Food Insecurity: Patient Declined (06/17/2024)   Hunger Vital Sign    Worried About Running Out of Food in the Last Year: Patient declined    Ran Out of Food in the Last Year: Patient declined  Transportation Needs: No Transportation Needs (06/17/2024)   PRAPARE - Administrator, Civil Service (Medical): No    Lack of Transportation (Non-Medical): No  Physical  Activity: Inactive (04/02/2022)   Exercise Vital Sign    Days of Exercise per Week: 0 days    Minutes of Exercise per Session: 0 min  Stress: No Stress Concern Present (04/02/2022)   [employer] of Occupational Health - Occupational Stress Questionnaire    Feeling of Stress : Not at all  Social Connections: Socially Integrated (06/15/2024)   Social Connection and Isolation Panel    Frequency of Communication with Friends and Family: More than three times a week    Frequency of Social Gatherings with Friends and Family: More than three times a week    Attends Religious Services: More than 4 times per year    Active Member of [employer] or Organizations: Yes    Attends Banker Meetings: More than 4 times per year  Marital Status: Married  Catering manager Violence: Patient Declined (06/17/2024)   Humiliation, Afraid, Rape, and Kick questionnaire    Fear of Current or Ex-Partner: Patient declined    Emotionally Abused: Patient declined    Physically Abused: Patient declined    Sexually Abused: Patient declined     Review of Systems    General:  No chills, fever, night sweats or weight changes.  Cardiovascular:  No chest pain, dyspnea on exertion, edema, orthopnea, palpitations, paroxysmal nocturnal dyspnea. Dermatological: No rash, lesions/masses Respiratory: No cough, dyspnea Urologic: No hematuria, dysuria Abdominal:   No nausea, vomiting, diarrhea, bright red blood per rectum, melena, or hematemesis Neurologic:  No visual changes, wkns, changes in mental status. All other systems reviewed and are otherwise negative except as noted above.  Physical Exam    VS:  There were no vitals taken for this visit. , BMI There is no height or weight on file to calculate BMI. GEN: Well nourished, well developed, in no acute distress. HEENT: normal. Neck: Supple, no JVD, carotid bruits, or masses. Cardiac: RRR, 4/6 systolicmurmur heard along right sternal border, rubs, or  gallops. No clubbing, cyanosis, Bilateral ankle 1+ pitting edema.  Radials/DP/PT 2+ and equal bilaterally.  Respiratory:  Respirations somewhat labored with crackles throughout all lung fields . GI: Soft, nontender, nondistended, BS + x 4. MS: no deformity or atrophy. Skin: warm and dry, no rash. Neuro:  Strength and sensation are intact. Psych: Normal affect.  Accessory Clinical Findings    Recent Labs: 03/23/2024: B Natriuretic Peptide 2,711.9; TSH 5.953 06/15/2024: Magnesium 2.0 06/16/2024: Pro Brain Natriuretic Peptide 12,611.0 06/28/2024: ALT 14; BUN 66; Creatinine, Ser 1.78; Hemoglobin 9.0; Platelets 175.0; Potassium 4.2; Sodium 141   Recent Lipid Panel    Component Value Date/Time   CHOL 158 06/24/2018 1004   TRIG 112.0 06/24/2018 1004   HDL 57.50 06/24/2018 1004   CHOLHDL 3 06/24/2018 1004   VLDL 22.4 06/24/2018 1004   LDLCALC 78 06/24/2018 1004    No BP recorded.  {Refresh Note OR Click here to enter BP  :1}***    ECG personally reviewed by me today-  none today.     Echocardiogram 06/05/2023  IMPRESSIONS     1. Left ventricular ejection fraction, by estimation, is 70 to 75%. The  left ventricle has hyperdynamic function. The left ventricle has no  regional wall motion abnormalities. There is moderate concentric left  ventricular hypertrophy. Indeterminate  diastolic filling due to E-A fusion.   2. Right ventricular systolic function is mildly reduced. The right  ventricular size is normal.   3. Left atrial size was severely dilated.   4. Large pericardial effusion surrounds heart, mainly posteriorly and  lateral to LV. [place]unak dimension is 5.6 cm posteriorly. It has increased  in size from echo done in June 2024. Does not meet criteria for tamponade  by echo criteria. Clinical  correlation indicated .SABRA Large pericardial effusion.   5. MV annulus is severely calcified. MV is thickened with restricted  motion. Difficult to see well Mean gradient through the MV is  8 mm Hg. MVA  (Pt1/2 ) 3 cm2. Compared to echo report from June 2024, mean gradient is  incresaed (5 to 8 mm Hg) Consider  TEE to evaluate further.. Trivial mitral valve regurgitation.   6. AV is thickened, calcified with restricted motion. Peak and mean  gradients through the valve are 59 and 37 mm Hg respectively.  Dimensionless index is 0.33 consistent with moderate AS.SABRA The aortic valve  is abnormal. Aortic valve regurgitation is not  visualized.   7. The inferior vena cava is normal in size with <50% respiratory  variability, suggesting right atrial pressure of 8 mmHg.   FINDINGS   Left Ventricle: Left ventricular ejection fraction, by estimation, is 70  to 75%. The left ventricle has hyperdynamic function. The left ventricle  has no regional wall motion abnormalities. The left ventricular internal  cavity size was small. There is  moderate concentric left ventricular hypertrophy. Indeterminate diastolic  filling due to E-A fusion.   Right Ventricle: The right ventricular size is normal. Right vetricular  wall thickness was not assessed. Right ventricular systolic function is  mildly reduced.   Left Atrium: Left atrial size was severely dilated.   Right Atrium: Right atrial size was normal in size.   Pericardium: Large pericardial effusion surrounds heart, mainly  posteriorly and lateral to LV. [place] dimension is 5.6 cm posteriorly. It  has increased in size from echo done in June 2024. Does not meet criteria  for tamponade by echo criteria. Clinical  correlation indicated. A large pericardial effusion is present.   Mitral Valve: MV annulus is severely calcified. MV is thickened with  restricted motion. Difficult to see well Mean gradient through the MV is 8  mm Hg. MVA (Pt1/2 ) 3 cm2. Compared to echo report from June 2024, mean  gradient is incresaed (5 to 8 mm Hg)  Consider TEE to evaluate further. Trivial mitral valve regurgitation. MV  peak gradient, 13.2 mmHg. The  mean mitral valve gradient is 8.0 mmHg.   Tricuspid Valve: The tricuspid valve is normal in structure. Tricuspid  valve regurgitation is mild.   Aortic Valve: AV is thickened, calcified with restricted motion. Peak and  mean gradients through the valve are 59 and 37 mm Hg respectively.  Dimensionless index is 0.33 consistent with moderate AS. The aortic valve  is abnormal. Aortic valve regurgitation   is not visualized. Aortic valve mean gradient measures 37.0 mmHg. Aortic  valve peak gradient measures 58.7 mmHg. Aortic valve area, by VTI measures  0.95 cm.   Pulmonic Valve: The pulmonic valve was not well visualized. Pulmonic valve  regurgitation is mild.   Aorta: The aortic root and ascending aorta are structurally normal, with  no evidence of dilitation.   Venous: The inferior vena cava is normal in size with less than 50%  respiratory variability, suggesting right atrial pressure of 8 mmHg.   IAS/Shunts: No atrial level shunt detected by color flow Doppler.   Echocardiogram 08/15/2023 IMPRESSIONS     1. Left ventricular ejection fraction, by estimation, is >75%. The left  ventricle has hyperdynamic function. The left ventricle has no regional  wall motion abnormalities. There is severe left ventricular hypertrophy.  Left ventricular diastolic  parameters are consistent with Grade I diastolic dysfunction (impaired  relaxation).   2. Right ventricular systolic function is normal. The right ventricular  size is normal.   3. Left atrial size was severely dilated.   4. Large pericardial effusion. The pericardial effusion is  circumferential. There is no evidence of cardiac tamponade.   5. The mitral valve is normal in structure. No evidence of mitral valve  regurgitation. No evidence of mitral stenosis. Severe mitral annular  calcification.   6. The aortic valve is normal in structure. There is severe calcifcation  of the aortic valve. There is moderate thickening of the  aortic valve.  Aortic valve regurgitation is not visualized. Mild aortic valve stenosis.  Aortic valve  mean gradient measures   14.8 mmHg. Aortic valve Vmax measures 2.59 m/s.   7. The inferior vena cava is normal in size with greater than 50%  respiratory variability, suggesting right atrial pressure of 3 mmHg.   Comparison(s): Large pericardial effusion noted on prior ECHO, no major  change.   Conclusion(s)/Recommendation(s): Findings consistent with hypertrophic  cardiomyopathy.   FINDINGS   Left Ventricle: Left ventricular ejection fraction, by estimation, is  >75%. The left ventricle has hyperdynamic function. The left ventricle has  no regional wall motion abnormalities. The left ventricular internal  cavity size was normal in size. There  is severe left ventricular hypertrophy. Left ventricular diastolic  parameters are consistent with Grade I diastolic dysfunction (impaired  relaxation).   Right Ventricle: The right ventricular size is normal. No increase in  right ventricular wall thickness. Right ventricular systolic function is  normal.   Left Atrium: Left atrial size was severely dilated.   Right Atrium: Right atrial size was normal in size.   Pericardium: A large pericardial effusion is present. The pericardial  effusion is circumferential. There is no evidence of cardiac tamponade.   Mitral Valve: The mitral valve is normal in structure. Severe mitral  annular calcification. No evidence of mitral valve regurgitation. No  evidence of mitral valve stenosis.   Tricuspid Valve: The tricuspid valve is normal in structure. Tricuspid  valve regurgitation is mild . No evidence of tricuspid stenosis.   Aortic Valve: The aortic valve is normal in structure. There is severe  calcifcation of the aortic valve. There is moderate thickening of the  aortic valve. Aortic valve regurgitation is not visualized. Mild aortic  stenosis is present. Aortic valve mean  gradient  measures 14.8 mmHg. Aortic valve peak gradient measures 26.9  mmHg. Aortic valve area, by VTI measures 1.12 cm.   Pulmonic Valve: The pulmonic valve was normal in structure. Pulmonic valve  regurgitation is not visualized. No evidence of pulmonic stenosis.   Aorta: The aortic root is normal in size and structure.   Venous: The inferior vena cava is normal in size with greater than 50%  respiratory variability, suggesting right atrial pressure of 3 mmHg.   IAS/Shunts: No atrial level shunt detected by color flow Doppler.   Echocardiogram 08/31/2023 IMPRESSIONS     1. Left ventricular ejection fraction, by estimation, is 60 to 65%. The  left ventricle has normal function. The left ventricle has no regional  wall motion abnormalities.   2. Right ventricular systolic function is normal. The right ventricular  size is normal.   3. Left atrial size was mildly dilated.   4. Severe mitral annular calcification. Visually there is mitral stenosis  but doppler interrogation was not done.   5. The aortic valve is tricuspid. There is severe calcifcation of the  aortic valve. Visually there is aortic stenosis but doppler interrogation  was not done.   6. The inferior vena cava is normal in size with <50% respiratory  variability, suggesting right atrial pressure of 8 mmHg.   7. There was a large circumferential pericardial effusion. There was no  RV diastolic collapse noted. Mitral E inflow velocity showed < 25%  respirophasic variation. The IVC was not significantly dilated. There does  not appear to be tamponade physiology.   8. Limited echo.   FINDINGS   Left Ventricle: Left ventricular ejection fraction, by estimation, is 60  to 65%. The left ventricle has normal function. The left ventricle has no  regional wall motion abnormalities. The  left ventricular internal cavity  size was normal in size.   Right Ventricle: The right ventricular size is normal. No increase in  right  ventricular wall thickness. Right ventricular systolic function is  normal.   Left Atrium: Left atrial size was mildly dilated.   Right Atrium: Right atrial size was normal in size.   Pericardium: There was a large circumferential pericardial effusion. There  was no RV diastolic collapse noted. Mitral E inflow velocity showed < 25%  respirophasic variation. The IVC was not significantly dilated. There does  not appear to be tamponade  physiology.   Mitral Valve: There is moderate calcification of the mitral valve  leaflet(s). Severe mitral annular calcification.   Aortic Valve: The aortic valve is tricuspid. There is severe calcifcation  of the aortic valve.   Venous: The inferior vena cava is normal in size with less than 50%  respiratory variability, suggesting right atrial pressure of 8 mmHg.   Additional Comments: Spectral Doppler performed. Color Doppler performed.    IVC  IVC diam: 1.50 cm   Dalton McleanMD  Electronically signed by Ezra Kanner  Signature Date/Time: 08/31/2023/4:29:29 PM      Echocardiogram 12/03/2023 IMPRESSIONS     1. Left ventricular ejection fraction, by estimation, is 70 to 75%. The  left ventricle has hyperdynamic function. The left ventricle has no  regional wall motion abnormalities. There is severe concentric left  ventricular hypertrophy. Left ventricular  diastolic parameters are consistent with Grade I diastolic dysfunction  (impaired relaxation). Elevated left ventricular end-diastolic pressure.   2. Right ventricular systolic function is normal. The right ventricular  size is normal.   3. There effusion measures 5.06cm at largest diameter posteriorly and  2.29cm anteriorly.. Large pericardial effusion. The pericardial effusion  is circumferential.   4. The mitral valve is normal in structure. Mild mitral valve  regurgitation. Mild to moderate mitral stenosis. The mean mitral valve  gradient is 6.0 mmHg.   5. Tricuspid valve  regurgitation is moderate.   6. The aortic valve is tricuspid. There is moderate calcification of the  aortic valve. There is moderate thickening of the aortic valve. Aortic  valve regurgitation is not visualized. Moderate aortic valve stenosis.  Aortic valve area, by VTI measures  1.17 cm. Aortic valve mean gradient measures 21.0 mmHg. Aortic valve Vmax  measures 3.14 m/s.   7. Compared to study dated 01/15/23 the pericardial effusion has increased  in diameter posteriorly (4cm prior andnow 5cm) and there is now a 40mm  respirophasic change in MV inflow velocity (2mm on prior echo). There is  no overt diastolic RV collapse, but   both the RV and LV are very underfilled. There is also now moderate AS.   8. Case discussed with Dr. Pietro who recommended patient come in to  office 2/14 for DOD visit if asymptomatic today.   FINDINGS   Left Ventricle: Left ventricular ejection fraction, by estimation, is 70  to 75%. The left ventricle has hyperdynamic function. The left ventricle  has no regional wall motion abnormalities. Strain imaging was not  performed. The left ventricular internal  cavity size was normal in size. There is severe concentric left  ventricular hypertrophy. Left ventricular diastolic parameters are  consistent with Grade I diastolic dysfunction (impaired relaxation).  Elevated left ventricular end-diastolic pressure.   Right Ventricle: The right ventricular size is normal. No increase in  right ventricular wall thickness. Right ventricular systolic function is  normal.   Left Atrium: Left atrial size was  normal in size.   Right Atrium: Right atrial size was normal in size.   Pericardium: There effusion measures 5.06cm at largest diameter  posteriorly and 2.29cm anteriorly. A large pericardial effusion is  present. The pericardial effusion is circumferential. There is diastolic  collapse of the right atrial wall, diastolic  collapse of the right ventricular free  wall and excessive respiratory  variation in the mitral valve spectral Doppler velocities.   Mitral Valve: The mitral valve is normal in structure. Mild mitral valve  regurgitation. Mild to moderate mitral valve stenosis. MV peak gradient,  12.1 mmHg. The mean mitral valve gradient is 6.0 mmHg.   Tricuspid Valve: The tricuspid valve is normal in structure. Tricuspid  valve regurgitation is moderate . No evidence of tricuspid stenosis.   Aortic Valve: The aortic valve is tricuspid. There is moderate  calcification of the aortic valve. There is moderate thickening of the  aortic valve. Aortic valve regurgitation is not visualized. Moderate  aortic stenosis is present. Aortic valve mean  gradient measures 21.0 mmHg. Aortic valve peak gradient measures 39.4  mmHg. Aortic valve area, by VTI measures 1.17 cm.   Pulmonic Valve: The pulmonic valve was normal in structure. Pulmonic valve  regurgitation is mild. No evidence of pulmonic stenosis.   Aorta: The aortic root is normal in size and structure.   Venous: The inferior vena cava was not well visualized.   IAS/Shunts: No atrial level shunt detected by color flow Doppler.     Cardiac catheterization 04/25/2022  1.  Ectatic but patent coronary arteries with no obstructive disease.  Right dominant coronary artery. 2.  Mild pulmonary hypertension with PA pressure 52/15, mean 28 mmHg, transpulmonary gradient 10 mmHg, PVR approximately 2 Wood units. 3.  Moderate aortic stenosis with mean transvalvular gradient 16 mmHg calculated aortic valve area 1.16 cm 4.  Heavy mitral annular calcification by plain fluoroscopy 5.  Mild aortic valve calcification by plain fluoroscopy   Recommend: Continued medical therapy, clinical follow-up, echo surveillance.  The patient does not appear to have severe aortic stenosis and I do not think she would benefit from TAVR at this time.   Diagnostic Dominance: Right  Intervention   Assessment & Plan    1.  Chronic diastolic CHF- Wt today 102***.8 lbs. euvolemic.  Breathing stable.  Recently admitted in the setting of acute on chronic respiratory failure secondary to COVID-19 infection.  She received IV diuresis and antiviral medication.  Previously, she did not tolerate Jardiance  medication.   Continue  spironolactone , furosemide  Heart healthy low-sodium diet-reviewed Continue daily weights and weight log  Aortic stenosis-on 5 L of oxygen  via nasal cannula.  4/***6 systolic murmur heard along right sternal border.  Not a candidate for surgery. Continue furosemide , spironolactone   Essential hypertension-BP today 133/***.  64 Maintain blood pressure log Continue spironolactone , furosemide , diltiazem  Heart healthy low-sodium diet  Pulmonary hypertension-on 4-5L***nasal cannula.  Breathing significantly improved.   Continue current inhalers, nebulizers  Pericardial effusion-this is chronic.  Previously noted to be stable.  Large pleural effusion noted.  Denies chest pain and pressure.  Heart rate today***. Continue current medical therapy  CKD stage III-creatinine 1.78 on 06/28/24.    Avoid nephrotoxic agents.   Repeat BMP Follows with PCP/nephrology   Disposition: Follow-up with Dr. Pietro or me in 3-4 months.     Paula Ward. Shivaan Tierno NP-C     07/01/2024, 7:28 AM [facility] [address] Office [phone] Fax [phone]    I spent  13*** minutes examining this patient, reviewing medications, and using patient centered shared decision making involving her cardiac care.   I spent greater than 20 minutes reviewing her past medical history,  medications, and prior cardiac tests.

## 2024-07-02 DIAGNOSIS — I509 Heart failure, unspecified: Secondary | ICD-10-CM | POA: Diagnosis not present

## 2024-07-04 ENCOUNTER — Ambulatory Visit: Admitting: General Practice

## 2024-07-04 ENCOUNTER — Encounter: Payer: Self-pay | Admitting: Pulmonary Disease

## 2024-07-04 ENCOUNTER — Ambulatory Visit (INDEPENDENT_AMBULATORY_CARE_PROVIDER_SITE_OTHER): Admitting: Pulmonary Disease

## 2024-07-04 VITALS — BP 132/70 | HR 83 | Temp 97.7°F | Ht 63.0 in | Wt 98.0 lb

## 2024-07-04 DIAGNOSIS — J9612 Chronic respiratory failure with hypercapnia: Secondary | ICD-10-CM | POA: Diagnosis not present

## 2024-07-04 DIAGNOSIS — I272 Pulmonary hypertension, unspecified: Secondary | ICD-10-CM

## 2024-07-04 DIAGNOSIS — I2721 Secondary pulmonary arterial hypertension: Secondary | ICD-10-CM

## 2024-07-04 DIAGNOSIS — J9611 Chronic respiratory failure with hypoxia: Secondary | ICD-10-CM | POA: Diagnosis not present

## 2024-07-04 DIAGNOSIS — J8489 Other specified interstitial pulmonary diseases: Secondary | ICD-10-CM | POA: Diagnosis not present

## 2024-07-04 DIAGNOSIS — I2729 Other secondary pulmonary hypertension: Secondary | ICD-10-CM

## 2024-07-04 NOTE — Patient Instructions (Addendum)
 Nice to see you again  No changes to your medication  We will walk today to see if you qualify for the portable oxygen  concentrator again  Return to clinic in 3 months or sooner as needed with Dr. Annella

## 2024-07-04 NOTE — Addendum Note (Signed)
 Addended by: MOODY RANCHER on: 07/04/2024 04:53 PM   Modules accepted: Orders

## 2024-07-04 NOTE — Progress Notes (Signed)
 @Patient  ID: Paula Ward, female    DOB: 06-Feb-1936, 88 y.o.   MRN: [id number]  Chief Complaint  Patient presents with   Shortness of Breath    Doing well    Referring provider: Rollene Almarie LABOR, *  HPI:   88 y.o. woman with history of pulmonary hypertension likely multifactorial as well as ILD, restrictive lung disease due to CTD/scleroderma whom we are seeing in hospital follow-up for acute on chronic respiratory failure related to volume overload.  Multiple hospital notes reviewed.  Hospitalized 05/2024 discharge.  Hypercarbic.  COVID-positive and also volume overloaded.  Diuresed.  Treated with steroids for COVID.  Seemed to improve.  No real changes in medication.  Now back out to hospital.  Back to baseline.  Still using 4 L.  Currently continuous.  She sent back her POC.  She wants her POC back.  We discussed a walk today to see if we could requalify.  She has my concern that on 4 L she may not qualify for via pulsed oxygen  may not keep oxygen  off.   HPI at initial visit: Patient here for evaluation of pulmonary hypertension.  Multiple hospitalizations for decompensated CHF left-sided disease, pulmonary venous hypertension.  As evidenced by serial images with pleural effusions and pulmonary edema.  Her echocardiogram over time is showing a severely dilated left atrium with valvular dysfunction as well as diastolic dysfunction.  Fortunately her RV functional serial echocardiograms look fine.  Her most recent right heart catheterization LVEDP of 18, PVR of 2 Wood units.  Her prior right heart catheterization at [facility] years prior with a PVR less than 2.  Both times on Opsumit .  Her initial cath 2001 with PVR 5.  We discussed at length the etiology of her pulmonary hypertension.  Has evolved over time.  The changing physiology of her pulmonary hypertension as a relates to her left-sided disease and valvular disease and likely worsening on pulmonary vasodilators.  She expressed  understanding.  She is feeling well since discharge.  Feels better off the Opsumit .  Discussed how VQ mismatch could worsen on Opsumit  given her interstitial lung disease.  Overall her hypoxemia seems improved.  She walked in the clinic today and immediate after sitting down with send saturation was 94%.  We discussed ongoing oxygen  use with exertion that she is to monitor at home as discussed below.   Questionaires / Pulmonary Flowsheets:   ACT:      No data to display          MMRC:     No data to display          Epworth:      No data to display          Tests:   FENO:  No results found for: NITRICOXIDE  PFT:    Latest Ref Rng & Units 04/12/2024    2:06 PM 08/25/2023    2:45 PM 02/03/2022   11:01 AM  PFT Results  FVC-Pre L 1.04  0.96  1.34   FVC-Predicted Pre % 46  42  56   FVC-Post L 1.00  0.91    FVC-Predicted Post % 44  40    Pre FEV1/FVC % % 89  79  84   Post FEV1/FCV % % 91  83    FEV1-Pre L 0.92  0.76  1.12   FEV1-Predicted Pre % 55  45  64   FEV1-Post L 0.92  0.76    DLCO uncorrected ml/min/mmHg 6.29  7.39  10.32   DLCO UNC% % 35  41  57   DLCO corrected ml/min/mmHg  8.46  11.49   DLCO COR %Predicted %  47  63   DLVA Predicted % 58  119  119   TLC L 2.43  3.02  2.88   TLC % Predicted % 49  61  58   RV % Predicted % 58  86  62   Personally reviewed and interpreted at time spirometry suggestive of severe restriction versus air trapping, no bronchodilator response.  Lung volumes consistent with severe restriction, DLCO severely reduced.  WALK:     07/15/2023    4:27 PM 03/13/2022    3:15 PM 01/06/2022   12:08 PM  SIX MIN WALK  Supplimental Oxygen  during Test? (L/min) No    2 Minute Oxygen  Saturation %  90 % 89 %  2 Minute HR  107 111  4 Minute Oxygen  Saturation %  89 % 93 %  4 Minute HR  111 113  6 Minute Oxygen  Saturation %  88 % 92 %  6 Minute HR  108 118  Tech Comments: patient walked one lap with no complaints, O2 sat 88%, patient aware  to use POC 2L w/exertion/activity      Imaging: Personally reviewed and as per EMR and discussion in this note DG Chest 2 View Result Date: 06/15/2024 CLINICAL DATA:  COVID positive with elevated CO2 levels EXAM: CHEST - 2 VIEW COMPARISON:  March 23, 2024 FINDINGS: The cardiac silhouette is markedly enlarged and unchanged in size. Moderate severity diffusely increased interstitial lung markings are noted. This is increased in severity when compared to the prior study. A left pleural effusion is suspected. No pneumothorax is identified. Extensive breast attenuation artifact is seen overlying the bilateral lung bases. Radiopaque surgical clips are present along the left axilla. Multilevel degenerative changes are seen throughout the thoracic spine. IMPRESSION: 1. Cardiomegaly with moderate severity interstitial edema. 2. Left basilar atelectasis and/or infiltrate with a left pleural effusion. Electronically Signed   By: Suzen Dials M.D.   On: 06/15/2024 00:52     Lab Results: Personally reviewed CBC    Component Value Date/Time   WBC 10.0 06/28/2024 1537   RBC 2.57 Repeated and verified X2. (L) 06/28/2024 1537   HGB 9.0 (L) 06/28/2024 1537   HGB 8.5 (L) 06/02/2024 1329   HGB 11.2 04/21/2022 0940   HCT 27.4 (L) 06/28/2024 1537   HCT 32.7 (L) 04/21/2022 0940   PLT 175.0 06/28/2024 1537   PLT 179 06/02/2024 1329   PLT 242 04/21/2022 0940   MCV 106.6 (H) 06/28/2024 1537   MCV 104 (H) 04/21/2022 0940   MCH 35.5 (H) 06/16/2024 0444   MCHC 33.1 06/28/2024 1537   RDW 13.1 06/28/2024 1537   RDW 13.1 04/21/2022 0940   LYMPHSABS 1.0 06/15/2024 0422   MONOABS 0.2 06/15/2024 0422   EOSABS 0.1 06/15/2024 0422   BASOSABS 0.0 06/15/2024 0422    BMET    Component Value Date/Time   NA 141 06/28/2024 1537   NA 139 05/13/2024 1137   K 4.2 06/28/2024 1537   CL 94 (L) 06/28/2024 1537   CO2 38 (H) 06/28/2024 1537   GLUCOSE 106 (H) 06/28/2024 1537   GLUCOSE 101 (H) 10/02/2006 0845   BUN 66  (H) 06/28/2024 1537   BUN 48 (H) 05/13/2024 1137   CREATININE 1.78 (H) 06/28/2024 1537   CREATININE 1.51 (H) 06/02/2024 1329   CALCIUM  8.8 06/28/2024 1537   GFRNONAA  46 (L) 06/16/2024 0444   GFRNONAA 34 (L) 06/02/2024 1329   GFRAA >60 05/24/2018 0652    BNP    Component Value Date/Time   BNP 2,711.9 (H) 03/23/2024 2023    ProBNP    Component Value Date/Time   PROBNP 12,611.0 (H) 06/16/2024 0444   PROBNP 2,680.0 (H) 06/03/2023 1200    Specialty Problems       Pulmonary Problems   ILD (interstitial lung disease) (HCC)   Qualifier: Diagnosis of  By: Nickola CMA, [place]        Allergic rhinitis   Globus pharyngeus   Chronic respiratory failure with hypoxia (HCC)   IPF (idiopathic pulmonary fibrosis) (HCC)   Oxygen  dependent    Allergies  Allergen Reactions   Codeine  Nausea Only    Hallucinations, too   Other Nausea And Vomiting and Other (See Comments)    -mycin antibiotics.   Also cause hallucinations.   Erythromycin Nausea And Vomiting   Iodinated Contrast Media Other (See Comments)    Renal issues   Lisinopril Other (See Comments)    Pt doesn't remember reaction   Mirtazapine  Other (See Comments)    Threw me into orbit   Mycophenolate Mofetil Nausea Only    Made me nervous   Sulfa  Antibiotics Other (See Comments)    Unknown reaction    Immunization History  Administered Date(s) Administered   Fluad Quad(high Dose 65+) 07/24/2020   INFLUENZA, HIGH DOSE SEASONAL PF 07/07/2017, 06/21/2019   Influenza-Unspecified 07/15/2013, 02/16/2015, 07/19/2015, 07/18/2016, 08/20/2018, 06/24/2021, 08/03/2022   Moderna Sars-Covid-2 Vaccination 11/01/2019, 11/29/2019, 09/14/2020, 02/16/2021   Pneumococcal Conjugate-13 01/30/2014   Pneumococcal Polysaccharide-23 08/30/2007   Respiratory Syncytial Virus Vaccine ,Recomb Aduvanted(Arexvy ) 09/24/2022   Tdap 06/26/2011, 06/07/2018   Zoster, Live 08/15/2014    Past Medical History:  Diagnosis Date   Anemia     Angiodysplasia of stomach and duodenum    Aortic stenosis    Arthus phenomenon    AVM (arteriovenous malformation)    Blood transfusion without reported diagnosis    Breast cancer (HCC) 1989   Left   Candida esophagitis (HCC)    Cataract    Chronic respiratory failure (HCC)    CKD stage 3b, GFR 30-44 ml/min (HCC)    Corneal edema    Corneal epithelial basement membrane dystrophy    CREST syndrome (HCC)    GERD (gastroesophageal reflux disease)    w/ HH   Interstitial lung disease (HCC)    Nodule of kidney    Pericardial effusion    PONV (postoperative nausea and vomiting)    Pulmonary embolus (HCC) 2003   Pulmonary hypertension (HCC)    Rectal incontinence    Renal cell carcinoma (HCC)    Scleroderma (HCC)    Tubular adenoma of colon    Uterine polyp     Tobacco History: Social History   Tobacco Use  Smoking Status Never  Smokeless Tobacco Never   Counseling given: Not Answered   Continue to not smoke  Outpatient Encounter Medications as of 07/04/2024  Medication Sig   acetaminophen  (TYLENOL ) 325 MG tablet Take 2 tablets (650 mg total) by mouth every 6 (six) hours as needed for moderate pain.   albuterol  (PROVENTIL ) (2.5 MG/3ML) 0.083% nebulizer solution Take 3 mLs (2.5 mg total) by nebulization every 6 (six) hours as needed for wheezing or shortness of breath.   albuterol  (VENTOLIN  HFA) 108 (90 Base) MCG/ACT inhaler INHALE 2 PUFFS INTO THE LUNGS EVERY 6 HOURS AS NEEDED FOR WHEEZING OR SHORTNESS OF BREATH  ALPRAZolam  (XANAX ) 0.25 MG tablet Take 1 tablet (0.25 mg total) by mouth at bedtime as needed for anxiety.   arformoterol  (BROVANA ) 15 MCG/2ML NEBU Take 2 mLs (15 mcg total) by nebulization 2 (two) times daily.   augmented betamethasone dipropionate (DIPROLENE-AF) 0.05 % cream Apply 1 application  topically 2 (two) times daily as needed (for irritation).   clobetasol ointment (TEMOVATE) 0.05 % Apply 1 Application topically 2 (two) times daily as needed (for  irritation).   diltiazem  (CARDIZEM ) 30 MG tablet Take 1 tablet (30 mg total) by mouth every 12 (twelve) hours.   docusate sodium  (COLACE) 100 MG capsule Take 100 mg by mouth daily.   famotidine  (PEPCID ) 20 MG tablet TAKE 1 TABLET BY MOUTH AT BEDTIME   fluticasone  (FLONASE ) 50 MCG/ACT nasal spray USE 1 SPRAY(S) IN EACH NOSTRIL ONCE DAILY AS NEEDED FOR ALLERGIES   furosemide  (LASIX ) 20 MG tablet Take 2 tablets ( 40 mg ) every morning may take extra 2 tablets for weight gain of 3 lbs   ipratropium (ATROVENT ) 0.02 % nebulizer solution Take 2.5 mLs (0.5 mg total) by nebulization 2 (two) times daily.   latanoprost  (XALATAN ) 0.005 % ophthalmic solution Place 1 drop into both eyes at bedtime.   Multiple Vitamin (MULTIVITAMIN WITH MINERALS) TABS tablet Take 1 tablet by mouth daily.   Nutritional Supplements (ENSURE HIGH PROTEIN) LIQD Take 0.5 Bottles by mouth daily as needed (for supplementation).   OXYGEN  Inhale 6 L/min into the lungs continuous.   pantoprazole  (PROTONIX ) 40 MG tablet TAKE 1 TABLET BY MOUTH TWICE DAILY BEFORE A MEAL   spironolactone  (ALDACTONE ) 25 MG tablet Take 1/2 tablet ( 12.5 mg ) every morning   sucralfate  (CARAFATE ) 1 GM/10ML suspension TAKE 10 MLS BY MOUTH EVERY 6 HOURS AS NEEDED FOR REFLUX   triamcinolone  (KENALOG ) 0.1 % paste Use as directed 1 Application in the mouth or throat 2 (two) times daily.   triamcinolone  cream (KENALOG ) 0.1 % Apply 1 Application topically See admin instructions. Apply to affected leg(s) after showers and up to 2 additional times a day as needed for irritation   revefenacin  (YUPELRI ) 175 MCG/3ML nebulizer solution Take 3 mLs (175 mcg total) by nebulization daily. (Patient not taking: Reported on 07/04/2024)   Facility-Administered Encounter Medications as of 07/04/2024  Medication   cyanocobalamin  (VITAMIN B12) injection 1,000 mcg     Review of Systems  Review of Systems  N/a Physical Exam  BP 132/70 (BP Location: Left Arm, Patient Position:  Sitting, Cuff Size: Normal)   Pulse 83   Temp 97.7 F (36.5 C) (Oral)   Ht 5' 3 (1.6 m)   Wt 98 lb (44.5 kg)   SpO2 97% Comment: 4l cont  BMI 17.36 kg/m   Wt Readings from Last 5 Encounters:  07/04/24 98 lb (44.5 kg)  06/28/24 95 lb (43.1 kg)  06/16/24 101 lb 10.1 oz (46.1 kg)  06/14/24 102 lb (46.3 kg)  06/14/24 102 lb 12.8 oz (46.6 kg)    BMI Readings from Last 5 Encounters:  07/04/24 17.36 kg/m  06/28/24 16.83 kg/m  06/16/24 18.00 kg/m  06/14/24 18.07 kg/m  06/14/24 18.21 kg/m     Physical Exam General: Sitting in chair, frail, elderly Eyes: EOMI, no icterus Neck: Supple, no JVP Pulmonary: Crackles in bilateral bases, otherwise clear, distant, on 4L via tanks Cardiovascular: Warm, trace edema to above ankles bilaterally Abdomen: Nondistended, bowel sounds present MSK: No synovitis, joint effusion is noted at the MCP joints Neuro: Normal gait, no weakness Psych: Normal mood,  full affect patient   Assessment & Plan:   Pulmonary hypertension: Documented on the right heart cath first diagnosed 2001.  Serial markers reviewed.  Multifactorial nature of disease primarily group 2 and 3 disease given elevated wedge pressure and mild ILD, hypoxemia.  At time of initial cath in 2001 PVR was elevated over 5 for group 1 disease likely was primary contributor back then, driven by scleroderma and CTD.  However, has a physiology of her heart has changed over time with diastolic dysfunction, aortic stenosis, mitral valve stenosis, diastolic dysfunction and development of left atrial hypertension, pulmonary venous hypertension, suspect systemic pulmonary vasodilators has led to exacerbation of left-sided congestive heart failure.  Multiple admissions for CHF with signs of pulmonary edema and pleural effusions again indicating pulmonary venous hypertension.  Her most recent catheterizations have indicated a PVR less than 3, admittedly on ERA, Opsumit .  However given left-sided disease  and left-sided lower pulmonary venous hypertension, we stopped her Opsumit .  She is feeling better.  Possibly contributing to worsening VQ mismatch as well in the setting of her ILD.  Frankly, given her frailty and complex and multifactorial disease, additional pulmonary vasodilators are likely not in her best interest.  She remains stable off Opsumit , weight stable, decreasing diuretic doses via her cardiology office.  Further titration of diuretics via cardiology.  Stressed importance of low-salt diet and diuretic therapy.   Fortunately RV function on most recent echocardiogram was excellent.  Chronic hypoxemic and hypercarbic respiratory failure: Likely multifactorial but primarily driven by CT ILD.  Encouraged to check oxygen  to keep in the mid 90s if able especially with exertion to avoid hypoxic vasoconstriction which can worsen acute pulmonary hypertension symptoms are chronically contribute to worsening pulmonary hypertension and RV failure.  On 4 L nasal cannula via tanks today.  See if we can qualify for POC today again, she previously was using this. To continue NIPPV via trilogy ventilator at night.  She is improved symptomatically with this.  She is clinically benefiting from this.  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  Largely unchanged over serial images over the last several years.  No progression.  In the past has declined antifibrotic's given side effect profile.  Given her age and frailty this is reasonable.  Further imaging felt unnecessary given would not change management.  Could consider in the future if worsening for prognosis etc.   Return in about 3 months (around 10/03/2024) for f/u Dr. Annella.   Donnice JONELLE Annella, MD 07/04/2024

## 2024-07-05 ENCOUNTER — Inpatient Hospital Stay: Admitting: Pulmonary Disease

## 2024-07-05 ENCOUNTER — Telehealth: Payer: Self-pay | Admitting: *Deleted

## 2024-07-05 NOTE — Telephone Encounter (Signed)
 ATC x2. No answer, vm is full so unable to leave vm.

## 2024-07-05 NOTE — Telephone Encounter (Signed)
 ATC vm full  re: pts request to d/c 02 equipment. Patient was seen in office yesterday and an order was placed for POC.

## 2024-07-08 ENCOUNTER — Telehealth

## 2024-07-08 NOTE — Patient Instructions (Signed)
 Orlean KATHEE Daring - I am sorry I was unable to reach you today for our scheduled appointment. I work with Rollene Almarie LABOR, MD and am calling to support your healthcare needs. Please contact me at [phone] at your earliest convenience. I look forward to speaking with you soon.   Thank you,   Heddy Shutter, RN, MSN, BSN, CCM [facility], Population Health Case Manager Phone: [phone]

## 2024-07-12 NOTE — Telephone Encounter (Signed)
 Spoke with Ms Guidry who states [facility] informed her yesterday something else was need from ourt office to process POC order. Can someone verify [facility] has received all that's needed?

## 2024-07-12 NOTE — Telephone Encounter (Signed)
 Spoke with [place] from [facility]. They are taking care of it and will call if anything else is needed.NFN

## 2024-07-14 ENCOUNTER — Other Ambulatory Visit: Payer: Self-pay | Admitting: Physician Assistant

## 2024-07-14 DIAGNOSIS — D649 Anemia, unspecified: Secondary | ICD-10-CM

## 2024-07-14 DIAGNOSIS — E538 Deficiency of other specified B group vitamins: Secondary | ICD-10-CM

## 2024-07-14 NOTE — Progress Notes (Signed)
 [facility] [phone]   [phone]  PROGRESS NOTE  Patient Care Team: Rollene Almarie LABOR, MD as PCP - General (Internal Medicine) Pietro Redell RAMAN, MD as PCP - Cardiology (Cardiology) Prentiss Heddy HERO, RN as [facility] Management Wallace, Juana M, RN  CHIEF COMPLAINTS/PURPOSE OF CONSULTATION:  Macrocytic anemia  Vitamin B12 deficiency  INTERVAL HISTORY: Paula Ward 88 y.o. female returns for a follow up for macrocytic anemia.  She was last seen by Dr. Federico on 05/19/2024. In the interim, she was admitted from 06/14/24-06/16/24 for acute on chronic heart failure and COVID.   On exam today, Paula Ward reports she is slowly recovering from her recent hospitalization with COVID infection.  She requires 3 to 4 L of supplemental oxygen .  She still struggles with some balance issues, requiring a cane or wheelchair to assist with ambulation.  She denies nausea, vomiting or bowel habit changes.  She denies easy bruising or signs of active bleeding.  She denies fevers, chills, night sweats, chest pain or cough.  She has no other complaints.  Rest of the 10 point ROS as below.   MEDICAL HISTORY:  Past Medical History:  Diagnosis Date   Anemia    Angiodysplasia of stomach and duodenum    Aortic stenosis    Arthus phenomenon    AVM (arteriovenous malformation)    Blood transfusion without reported diagnosis    Breast cancer (HCC) 1989   Left   Candida esophagitis (HCC)    Cataract    Chronic respiratory failure (HCC)    CKD stage 3b, GFR 30-44 ml/min (HCC)    Corneal edema    Corneal epithelial basement membrane dystrophy    CREST syndrome (HCC)    GERD (gastroesophageal reflux disease)    w/ HH   Interstitial lung disease (HCC)    Nodule of kidney    Pericardial effusion    PONV (postoperative nausea and vomiting)    Pulmonary embolus (HCC) 2003   Pulmonary hypertension (HCC)    Rectal incontinence    Renal cell carcinoma (HCC)     Scleroderma (HCC)    Tubular adenoma of colon    Uterine polyp     SURGICAL HISTORY: Past Surgical History:  Procedure Laterality Date   APPENDECTOMY  1962   BREAST LUMPECTOMY  1989   left   CATARACT EXTRACTION, BILATERAL Bilateral 12/2013   COLONOSCOPY WITH PROPOFOL  N/A 04/15/2021   Procedure: COLONOSCOPY WITH PROPOFOL ;  Surgeon: Teressa Toribio SQUIBB, MD;  Location: [facility];  Service: Endoscopy;  Laterality: N/A;   ENTEROSCOPY N/A 01/18/2016   Procedure: ENTEROSCOPY;  Surgeon: Gustav Shila GAILS, MD;  Location: [facility];  Service: Endoscopy;  Laterality: N/A;   ENTEROSCOPY N/A 05/24/2018   Procedure: ENTEROSCOPY;  Surgeon: Charlanne Groom, MD;  Location: [facility];  Service: Endoscopy;  Laterality: N/A;   ENTEROSCOPY N/A 03/29/2021   Procedure: ENTEROSCOPY;  Surgeon: Albertus Gordy HERO, MD;  Location: [facility];  Service: Gastroenterology;  Laterality: N/A;   ENTEROSCOPY N/A 04/13/2021   Procedure: ENTEROSCOPY;  Surgeon: Leigh Elspeth SQUIBB, MD;  Location: [facility];  Service: Gastroenterology;  Laterality: N/A;   ESOPHAGOGASTRODUODENOSCOPY (EGD) WITH PROPOFOL  N/A 12/21/2015   Procedure: ESOPHAGOGASTRODUODENOSCOPY (EGD) WITH PROPOFOL ;  Surgeon: Norleen LOISE Kiang, MD;  [facility]: [facility];  Service: Endoscopy;  Laterality: N/A;   HOT HEMOSTASIS N/A 05/24/2018   Procedure: HOT HEMOSTASIS (ARGON PLASMA COAGULATION/BICAP);  Surgeon: Charlanne Groom, MD;  Location: [facility];  Service: Endoscopy;  Laterality: N/A;   HOT HEMOSTASIS  N/A 03/29/2021   Procedure: HOT HEMOSTASIS (ARGON PLASMA COAGULATION/BICAP);  Surgeon: Albertus Gordy HERO, MD;  Location: [facility];  Service: Gastroenterology;  Laterality: N/A;   HOT HEMOSTASIS N/A 04/13/2021   Procedure: HOT HEMOSTASIS (ARGON PLASMA COAGULATION/BICAP);  Surgeon: Leigh Elspeth SQUIBB, MD;  Location: [facility];  Service: Gastroenterology;  Laterality: N/A;   INTRAMEDULLARY (IM) NAIL INTERTROCHANTERIC Left 10/11/2022   Procedure: INTRAMEDULLARY (IM)  NAIL INTERTROCHANTERIC;  Surgeon: Melodi Lerner, MD;  [facility]: [facility];  Service: Orthopedics;  Laterality: Left;   IR GENERIC HISTORICAL  06/05/2016   IR RADIOLOGIST EVAL & MGMT 06/05/2016 Marcey Moan, MD [facility] INTERV RAD   IVC Filter     KIDNEY SURGERY     left -laser surgery by Dr. moan- 5 yrs ago no removal   RIGHT/LEFT HEART CATH AND CORONARY ANGIOGRAPHY N/A 04/25/2022   Procedure: RIGHT/LEFT HEART CATH AND CORONARY ANGIOGRAPHY;  Surgeon: Wonda Sharper, MD;  [facility] INVAS[facility] CV LAB;  Service: Cardiovascular;  Laterality: N/A;   SCHLEROTHERAPY  05/24/2018   Procedure: WALDEMAR;  Surgeon: Charlanne Groom, MD;  Location: [facility];  Service: Endoscopy;;   SUBMUCOSAL TATTOO INJECTION  04/13/2021   Procedure: SUBMUCOSAL TATTOO INJECTION;  Surgeon: Leigh Elspeth SQUIBB, MD;  Location: [facility];  Service: Gastroenterology;;   TONSILLECTOMY     TUBAL LIGATION      SOCIAL HISTORY: Social History   Socioeconomic History   Marital status: Married    Spouse name: Not on file   Number of children: 2   Years of education: Not on file   Highest education level: Not on file  Occupational History   Occupation: Retired  Tobacco Use   Smoking status: Never   Smokeless tobacco: Never  Vaping Use   Vaping status: Never Used  Substance and Sexual Activity   Alcohol use: No   Drug use: No   Sexual activity: Not Currently  Other Topics Concern   Not on file  Social History Narrative   Married '611 son- '65, 1 daughter '63; 6 children (2 adopted)SO- SOBRetirement- doing well. Marriage in good health. Patient has never smoked. Alcohol use- noPt gets regular exercise   Social Drivers of Health   Financial Resource Strain: Low Risk  (04/02/2022)   Overall Financial Resource Strain (CARDIA)    Difficulty of Paying Living Expenses: Not hard at all  Food Insecurity: Patient Declined (06/17/2024)   Hunger Vital Sign    Worried About Running Out of Food in the Last Year:  Patient declined    Ran Out of Food in the Last Year: Patient declined  Transportation Needs: No Transportation Needs (06/17/2024)   PRAPARE - Administrator, Civil Service (Medical): No    Lack of Transportation (Non-Medical): No  Physical Activity: Inactive (04/02/2022)   Exercise Vital Sign    Days of Exercise per Week: 0 days    Minutes of Exercise per Session: 0 min  Stress: No Stress Concern Present (04/02/2022)   [employer] of Occupational Health - Occupational Stress Questionnaire    Feeling of Stress : Not at all  Social Connections: Socially Integrated (06/15/2024)   Social Connection and Isolation Panel    Frequency of Communication with Friends and Family: More than three times a week    Frequency of Social Gatherings with Friends and Family: More than three times a week    Attends Religious Services: More than 4 times per year    Active Member of [employer] or Organizations: Yes    Attends Club or  Organization Meetings: More than 4 times per year    Marital Status: Married  Intimate Partner Violence: Patient Declined (06/17/2024)   Humiliation, Afraid, Rape, and Kick questionnaire    Fear of Current or Ex-Partner: Patient declined    Emotionally Abused: Patient declined    Physically Abused: Patient declined    Sexually Abused: Patient declined    FAMILY HISTORY: Family History  Problem Relation Age of Onset   Bladder Cancer Father    Diabetes Father    Prostate cancer Father    Alzheimer's disease Mother    Diabetes Sister    Lung cancer Sister         smoker   Esophageal cancer Paternal Uncle    Colon cancer Neg Hx     ALLERGIES:  is allergic to codeine , other, erythromycin, iodinated contrast media, lisinopril, mirtazapine , mycophenolate mofetil, and sulfa  antibiotics.  MEDICATIONS:  Current Outpatient Medications  Medication Sig Dispense Refill   acetaminophen  (TYLENOL ) 325 MG tablet Take 2 tablets (650 mg total) by mouth every 6 (six) hours as  needed for moderate pain. 30 tablet 0   albuterol  (PROVENTIL ) (2.5 MG/3ML) 0.083% nebulizer solution Take 3 mLs (2.5 mg total) by nebulization every 6 (six) hours as needed for wheezing or shortness of breath. 75 mL 12   albuterol  (VENTOLIN  HFA) 108 (90 Base) MCG/ACT inhaler INHALE 2 PUFFS INTO THE LUNGS EVERY 6 HOURS AS NEEDED FOR WHEEZING OR SHORTNESS OF BREATH 6.7 g 3   ALPRAZolam  (XANAX ) 0.25 MG tablet Take 1 tablet (0.25 mg total) by mouth at bedtime as needed for anxiety. 30 tablet 1   arformoterol  (BROVANA ) 15 MCG/2ML NEBU Take 2 mLs (15 mcg total) by nebulization 2 (two) times daily. 120 mL 6   augmented betamethasone dipropionate (DIPROLENE-AF) 0.05 % cream Apply 1 application  topically 2 (two) times daily as needed (for irritation).     clobetasol ointment (TEMOVATE) 0.05 % Apply 1 Application topically 2 (two) times daily as needed (for irritation).     diltiazem  (CARDIZEM ) 30 MG tablet Take 1 tablet (30 mg total) by mouth every 12 (twelve) hours. 60 tablet 11   docusate sodium  (COLACE) 100 MG capsule Take 100 mg by mouth daily.     famotidine  (PEPCID ) 20 MG tablet TAKE 1 TABLET BY MOUTH AT BEDTIME 30 tablet 0   fluticasone  (FLONASE ) 50 MCG/ACT nasal spray USE 1 SPRAY(S) IN EACH NOSTRIL ONCE DAILY AS NEEDED FOR ALLERGIES 16 g 0   furosemide  (LASIX ) 20 MG tablet Take 2 tablets ( 40 mg ) every morning may take extra 2 tablets for weight gain of 3 lbs 360 tablet 3   ipratropium (ATROVENT ) 0.02 % nebulizer solution Take 2.5 mLs (0.5 mg total) by nebulization 2 (two) times daily. 450 mL 3   latanoprost  (XALATAN ) 0.005 % ophthalmic solution Place 1 drop into both eyes at bedtime.     Multiple Vitamin (MULTIVITAMIN WITH MINERALS) TABS tablet Take 1 tablet by mouth daily.     Nutritional Supplements (ENSURE HIGH PROTEIN) LIQD Take 0.5 Bottles by mouth daily as needed (for supplementation).     OXYGEN  Inhale 6 L/min into the lungs continuous.     pantoprazole  (PROTONIX ) 40 MG tablet TAKE 1 TABLET  BY MOUTH TWICE DAILY BEFORE A MEAL 180 tablet 0   spironolactone  (ALDACTONE ) 25 MG tablet Take 1/2 tablet ( 12.5 mg ) every morning     sucralfate  (CARAFATE ) 1 GM/10ML suspension TAKE 10 MLS BY MOUTH EVERY 6 HOURS AS NEEDED FOR REFLUX 420 mL  0   triamcinolone  (KENALOG ) 0.1 % paste Use as directed 1 Application in the mouth or throat 2 (two) times daily. 5 g 12   triamcinolone  cream (KENALOG ) 0.1 % Apply 1 Application topically See admin instructions. Apply to affected leg(s) after showers and up to 2 additional times a day as needed for irritation     No current facility-administered medications for this visit.   Facility-Administered Medications Ordered in Other Visits  Medication Dose Route Frequency Provider Last Rate Last Admin   cyanocobalamin  (VITAMIN B12) injection 1,000 mcg  1,000 mcg Intramuscular Once Oona Trammel T, PA-C        REVIEW OF SYSTEMS:   Constitutional: ( - ) fevers, ( - )  chills , ( - ) night sweats Eyes: ( - ) blurriness of vision, ( - ) double vision, ( - ) watery eyes Ears, nose, mouth, throat, and face: ( - ) mucositis, ( - ) sore throat Respiratory: ( - ) cough, ( + ) dyspnea, ( - ) wheezes Cardiovascular: ( - ) palpitation, ( - ) chest discomfort, ( - ) lower extremity swelling Gastrointestinal:  ( - ) nausea, ( - ) heartburn, ( - ) change in bowel habits Skin: ( - ) abnormal skin rashes Lymphatics: ( - ) new lymphadenopathy, ( - ) easy bruising Neurological: ( - ) numbness, ( - ) tingling, ( - ) new weaknesses Behavioral/Psych: ( - ) mood change, ( - ) new changes  All other systems were reviewed with the patient and are negative.  PHYSICAL EXAMINATION: ECOG PERFORMANCE STATUS: 1 - Symptomatic but completely ambulatory  Vitals:   07/15/24 1007  BP: 137/61  Pulse: 81  Resp: 16  Temp: (!) 97.5 F (36.4 C)  SpO2: 100%      Filed Weights   07/15/24 1007  Weight: 100 lb 8 oz (45.6 kg)       GENERAL:  elderly female in NAD  SKIN: skin color,  texture, turgor are normal, no rashes or significant lesions EYES: conjunctiva are pink and non-injected, sclera clear LUNGS: clear to auscultation and percussion with normal breathing effort HEART: regular rate & rhythm and no murmurs and no lower extremity edema Musculoskeletal: no cyanosis of digits and no clubbing  PSYCH: alert & oriented x 3, fluent speech NEURO: no focal motor/sensory deficits  LABORATORY DATA:  I have reviewed the data as listed    Latest Ref Rng & Units 07/15/2024    9:31 AM 06/28/2024    3:37 PM 06/16/2024    4:44 AM  CBC  WBC 4.0 - 10.5 K/uL 4.7  10.0  3.4   Hemoglobin 12.0 - 15.0 g/dL 8.7  9.0  9.4   Hematocrit 36.0 - 46.0 % 27.4  27.4  30.8   Platelets 150 - 400 K/uL 256  175.0  159        Latest Ref Rng & Units 06/28/2024    3:37 PM 06/16/2024    4:44 AM 06/15/2024    4:22 AM  CMP  Glucose 70 - 99 mg/dL 893  869  81   BUN 6 - 23 mg/dL 66  48  46   Creatinine 0.40 - 1.20 mg/dL 8.21  8.82  8.73   Sodium 135 - 145 mEq/L 141  142  141   Potassium 3.5 - 5.1 mEq/L 4.2  4.8  4.4   Chloride 96 - 112 mEq/L 94  96  97   CO2 19 - 32 mEq/L 38  29  32  Calcium  8.4 - 10.5 mg/dL 8.8  8.6  8.8   Total Protein 6.0 - 8.3 g/dL 6.7   6.1   Total Bilirubin 0.2 - 1.2 mg/dL 0.6   0.5   Alkaline Phos 39 - 117 U/L 60   71   AST 0 - 37 U/L 20   30   ALT 0 - 35 U/L 14   10    RADIOGRAPHIC STUDIES: No results found.    ASSESSMENT & PLAN Paula Ward is a 88 y.o. female who presents to the hematology clinic for evaluation of macrocytic anemia  #Macrocytic anemia: #Vitamin B12 deficiency: #Anemia of chronic disease --Suspect multifactorial including vitamin b12 deficinecy, CKD and chronic disease.  --Iron  panel from 12/30/2022 reviewed without deficiency.  --Patient had retacrit  10,000 units q 4 weeks, last received 12/30/2022. Will administer again today.  --Workup from 02/12/2023 didn't reveal hemolysis, paraproteinemia, folate deficiency. --bone marrow biopsy on  08/14/2023 showed no clear cause for the macrocytosis, though excessive iron  deposition was noted. HFE genetic testing was negative in November 2024 -- no evidence of cirrhosis on prior imaging from 2023 (Chest imaging, but liver visualized). --Labs from today show WBC 4.7, Hgb 8.7, MCV 113.2, Plt 256, creatinine is overall stable at 1.64, LFTS normal. Vitamin B12 and iron  levels show no deficiency.  --recommend to continue with monthly retacrit  and B12 injection.   --RTC q 4 weeks for labs/injection and 8 weeks for labs/visit/injection  No orders of the defined types were placed in this encounter.   All questions were answered. The patient knows to call the clinic with any problems, questions or concerns.  I have spent a total of 30 minutes minutes of face-to-face and non-face-to-face time, preparing to see the patient, obtaining and/or reviewing separately obtained history, performing a medically appropriate examination, counseling and educating the patient, ordering tests/procedures,documenting clinical information in the electronic health record, and care coordination.   Johnston Police PA-C Dept of Hematology and Oncology [facility] Phone: [phone]

## 2024-07-15 ENCOUNTER — Inpatient Hospital Stay

## 2024-07-15 ENCOUNTER — Inpatient Hospital Stay: Admitting: Physician Assistant

## 2024-07-15 ENCOUNTER — Inpatient Hospital Stay: Attending: Physician Assistant

## 2024-07-15 VITALS — BP 137/61 | HR 81 | Temp 97.5°F | Resp 16 | Wt 100.5 lb

## 2024-07-15 DIAGNOSIS — Z8616 Personal history of COVID-19: Secondary | ICD-10-CM | POA: Insufficient documentation

## 2024-07-15 DIAGNOSIS — Z86711 Personal history of pulmonary embolism: Secondary | ICD-10-CM | POA: Insufficient documentation

## 2024-07-15 DIAGNOSIS — D7589 Other specified diseases of blood and blood-forming organs: Secondary | ICD-10-CM | POA: Insufficient documentation

## 2024-07-15 DIAGNOSIS — Z860101 Personal history of adenomatous and serrated colon polyps: Secondary | ICD-10-CM | POA: Insufficient documentation

## 2024-07-15 DIAGNOSIS — Z833 Family history of diabetes mellitus: Secondary | ICD-10-CM | POA: Insufficient documentation

## 2024-07-15 DIAGNOSIS — Z8052 Family history of malignant neoplasm of bladder: Secondary | ICD-10-CM | POA: Insufficient documentation

## 2024-07-15 DIAGNOSIS — E538 Deficiency of other specified B group vitamins: Secondary | ICD-10-CM

## 2024-07-15 DIAGNOSIS — Z881 Allergy status to other antibiotic agents status: Secondary | ICD-10-CM | POA: Diagnosis not present

## 2024-07-15 DIAGNOSIS — J9610 Chronic respiratory failure, unspecified whether with hypoxia or hypercapnia: Secondary | ICD-10-CM | POA: Insufficient documentation

## 2024-07-15 DIAGNOSIS — Z79899 Other long term (current) drug therapy: Secondary | ICD-10-CM | POA: Diagnosis not present

## 2024-07-15 DIAGNOSIS — N1832 Chronic kidney disease, stage 3b: Secondary | ICD-10-CM | POA: Diagnosis not present

## 2024-07-15 DIAGNOSIS — Z9049 Acquired absence of other specified parts of digestive tract: Secondary | ICD-10-CM | POA: Diagnosis not present

## 2024-07-15 DIAGNOSIS — D631 Anemia in chronic kidney disease: Secondary | ICD-10-CM | POA: Insufficient documentation

## 2024-07-15 DIAGNOSIS — Z888 Allergy status to other drugs, medicaments and biological substances status: Secondary | ICD-10-CM | POA: Diagnosis not present

## 2024-07-15 DIAGNOSIS — Z820 Family history of epilepsy and other diseases of the nervous system: Secondary | ICD-10-CM | POA: Diagnosis not present

## 2024-07-15 DIAGNOSIS — Z800 Family history of malignant neoplasm of digestive organs: Secondary | ICD-10-CM | POA: Insufficient documentation

## 2024-07-15 DIAGNOSIS — D519 Vitamin B12 deficiency anemia, unspecified: Secondary | ICD-10-CM | POA: Diagnosis not present

## 2024-07-15 DIAGNOSIS — Z9089 Acquired absence of other organs: Secondary | ICD-10-CM | POA: Insufficient documentation

## 2024-07-15 DIAGNOSIS — I509 Heart failure, unspecified: Secondary | ICD-10-CM | POA: Diagnosis not present

## 2024-07-15 DIAGNOSIS — Z801 Family history of malignant neoplasm of trachea, bronchus and lung: Secondary | ICD-10-CM | POA: Insufficient documentation

## 2024-07-15 DIAGNOSIS — I350 Nonrheumatic aortic (valve) stenosis: Secondary | ICD-10-CM | POA: Diagnosis not present

## 2024-07-15 DIAGNOSIS — Z85528 Personal history of other malignant neoplasm of kidney: Secondary | ICD-10-CM | POA: Insufficient documentation

## 2024-07-15 DIAGNOSIS — Z882 Allergy status to sulfonamides status: Secondary | ICD-10-CM | POA: Diagnosis not present

## 2024-07-15 DIAGNOSIS — Z853 Personal history of malignant neoplasm of breast: Secondary | ICD-10-CM | POA: Insufficient documentation

## 2024-07-15 DIAGNOSIS — D649 Anemia, unspecified: Secondary | ICD-10-CM | POA: Diagnosis not present

## 2024-07-15 DIAGNOSIS — I272 Pulmonary hypertension, unspecified: Secondary | ICD-10-CM | POA: Insufficient documentation

## 2024-07-15 DIAGNOSIS — Z885 Allergy status to narcotic agent status: Secondary | ICD-10-CM | POA: Diagnosis not present

## 2024-07-15 LAB — FERRITIN: Ferritin: 1485 ng/mL — ABNORMAL HIGH (ref 11–307)

## 2024-07-15 LAB — CBC WITH DIFFERENTIAL (CANCER CENTER ONLY)
Abs Immature Granulocytes: 0.03 K/uL (ref 0.00–0.07)
Basophils Absolute: 0 K/uL (ref 0.0–0.1)
Basophils Relative: 0 %
Eosinophils Absolute: 0.1 K/uL (ref 0.0–0.5)
Eosinophils Relative: 2 %
HCT: 27.4 % — ABNORMAL LOW (ref 36.0–46.0)
Hemoglobin: 8.7 g/dL — ABNORMAL LOW (ref 12.0–15.0)
Immature Granulocytes: 1 %
Lymphocytes Relative: 12 %
Lymphs Abs: 0.5 K/uL — ABNORMAL LOW (ref 0.7–4.0)
MCH: 36 pg — ABNORMAL HIGH (ref 26.0–34.0)
MCHC: 31.8 g/dL (ref 30.0–36.0)
MCV: 113.2 fL — ABNORMAL HIGH (ref 80.0–100.0)
Monocytes Absolute: 0.2 K/uL (ref 0.1–1.0)
Monocytes Relative: 5 %
Neutro Abs: 3.8 K/uL (ref 1.7–7.7)
Neutrophils Relative %: 80 %
Platelet Count: 256 K/uL (ref 150–400)
RBC: 2.42 MIL/uL — ABNORMAL LOW (ref 3.87–5.11)
RDW: 14.5 % (ref 11.5–15.5)
WBC Count: 4.7 K/uL (ref 4.0–10.5)
nRBC: 0 % (ref 0.0–0.2)

## 2024-07-15 LAB — CMP (CANCER CENTER ONLY)
ALT: 10 U/L (ref 0–44)
AST: 19 U/L (ref 15–41)
Albumin: 4 g/dL (ref 3.5–5.0)
Alkaline Phosphatase: 77 U/L (ref 38–126)
Anion gap: 7 (ref 5–15)
BUN: 52 mg/dL — ABNORMAL HIGH (ref 8–23)
CO2: 33 mmol/L — ABNORMAL HIGH (ref 22–32)
Calcium: 9.1 mg/dL (ref 8.9–10.3)
Chloride: 100 mmol/L (ref 98–111)
Creatinine: 1.64 mg/dL — ABNORMAL HIGH (ref 0.44–1.00)
GFR, Estimated: 30 mL/min — ABNORMAL LOW (ref >60)
Glucose, Bld: 115 mg/dL — ABNORMAL HIGH (ref 70–99)
Potassium: 4 mmol/L (ref 3.5–5.1)
Sodium: 140 mmol/L (ref 135–145)
Total Bilirubin: 0.5 mg/dL (ref 0.0–1.2)
Total Protein: 6.8 g/dL (ref 6.5–8.1)

## 2024-07-15 LAB — IRON AND IRON BINDING CAPACITY (CC-WL,HP ONLY)
Iron: 50 ug/dL (ref 28–170)
Saturation Ratios: 24 % (ref 10.4–31.8)
TIBC: 209 ug/dL — ABNORMAL LOW (ref 250–450)
UIBC: 159 ug/dL (ref 148–442)

## 2024-07-15 LAB — VITAMIN B12: Vitamin B-12: 577 pg/mL (ref 180–914)

## 2024-07-15 MED ORDER — CYANOCOBALAMIN 1000 MCG/ML IJ SOLN
1000.0000 ug | Freq: Once | INTRAMUSCULAR | Status: AC
  Administered 2024-07-15: 1000 ug via INTRAMUSCULAR
  Filled 2024-07-15: qty 1

## 2024-07-15 MED ORDER — EPOETIN ALFA-EPBX 10000 UNIT/ML IJ SOLN
10000.0000 [IU] | Freq: Once | INTRAMUSCULAR | Status: AC
  Administered 2024-07-15: 10000 [IU] via SUBCUTANEOUS
  Filled 2024-07-15: qty 1

## 2024-07-15 NOTE — Progress Notes (Signed)
 I have mailed letter out to patient

## 2024-07-18 ENCOUNTER — Telehealth: Payer: Self-pay | Admitting: Physician Assistant

## 2024-07-18 NOTE — Telephone Encounter (Signed)
 Left the patient a voicemail with the scheduled appointment details.

## 2024-07-19 ENCOUNTER — Encounter (HOSPITAL_COMMUNITY): Payer: Self-pay

## 2024-07-19 ENCOUNTER — Encounter: Payer: Self-pay | Admitting: Physician Assistant

## 2024-07-19 ENCOUNTER — Telehealth: Payer: Self-pay

## 2024-07-19 DIAGNOSIS — J9620 Acute and chronic respiratory failure, unspecified whether with hypoxia or hypercapnia: Secondary | ICD-10-CM | POA: Diagnosis not present

## 2024-07-19 DIAGNOSIS — J449 Chronic obstructive pulmonary disease, unspecified: Secondary | ICD-10-CM | POA: Diagnosis not present

## 2024-07-19 LAB — METHYLMALONIC ACID, SERUM: Methylmalonic Acid, Quantitative: 655 nmol/L — ABNORMAL HIGH (ref 0–378)

## 2024-07-19 NOTE — Patient Outreach (Signed)
 Complex Care Management   Visit Note  07/19/2024  Name:  Paula Ward MRN: [id number] DOB: 10/03/1936  Situation: 06/14/24-06/16/24 admit for COVID 19  infection/ acute on chronic hypoxemic respiratory failure. RNCM called to follow up. Patient reports this is not a good time to talk, states her husband is at [facility], and request RNCM call her back in the middle of the month. Patient reports. I am alright and reports she has all her medications. She reports she continues to use O2 at 3-4l/[place] continuous. Per review of chart patient completed visit with pulmonologist on 07/04/24 per pulmonologist note: patient back to baseline. Last visit with PCP 06/28/24.   Follow Up Plan:   Telephone follow up appointment date/time:  08/02/24 at 10:00 am  Heddy Shutter, RN, MSN, BSN, CCM [place]  [facility], Population Health Case Manager Phone: [phone]

## 2024-07-20 ENCOUNTER — Encounter: Payer: Self-pay | Admitting: Internal Medicine

## 2024-07-21 ENCOUNTER — Other Ambulatory Visit: Payer: Self-pay | Admitting: Nurse Practitioner

## 2024-07-21 DIAGNOSIS — L89109 Pressure ulcer of unspecified part of back, unspecified stage: Secondary | ICD-10-CM

## 2024-07-21 NOTE — Telephone Encounter (Signed)
**Note De-identified  [place] Obfuscation** Please advise 

## 2024-07-22 ENCOUNTER — Ambulatory Visit (INDEPENDENT_AMBULATORY_CARE_PROVIDER_SITE_OTHER): Admitting: Family

## 2024-07-22 ENCOUNTER — Encounter: Payer: Self-pay | Admitting: Family

## 2024-07-22 ENCOUNTER — Ambulatory Visit: Payer: Self-pay

## 2024-07-22 VITALS — BP 120/78 | HR 70 | Temp 97.7°F | Ht 63.0 in

## 2024-07-22 DIAGNOSIS — L89104 Pressure ulcer of unspecified part of back, stage 4: Secondary | ICD-10-CM | POA: Diagnosis not present

## 2024-07-22 NOTE — Telephone Encounter (Signed)
 FYI Only or Action Required?: FYI only for provider.  Patient was last seen in primary care on 06/28/2024 by Rollene Almarie LABOR, MD.  Called Nurse Triage reporting Skin Ulcer.  Symptoms began 06/14/2024.  Interventions attempted: OTC medications: ABX ointment.  Symptoms are: gradually worsening.  Triage Disposition: See Physician Within 24 Hours  Patient/caregiver understands and will follow disposition?: Yes                    Copied from CRM #[id number]. Topic: Clinical - Red Word Triage >> Jul 22, 2024  8:31 AM Burnard DEL wrote: Red Word that prompted transfer to Nurse Triage: wound on back Reason for Disposition  Large sore (> 1 inch or 2.5 cm across)  Answer Assessment - Initial Assessment Questions 1. APPEARANCE of SORES: What do the sores look like?     See picture 2. NUMBER: How many sores are there?     1 3. SIZE: How big is the largest sore?     See picture 4. LOCATION: Where are the sores located?     On back 5. ONSET: When did the sores begin?     06/14/2024 6. TENDER: Does it hurt when you touch it?  (Scale 1-10; or mild, moderate, severe)      Not really 7. CAUSE: What do you think is causing the sores?     Pressure from sleeping on un covered pillow. Pillow stuck to back when pillow was removed , skin came with it. 8. OTHER SYMPTOMS: Do you have any other symptoms? (e.g., fever, new weakness)     no  Protocols used: Sores-A-AH

## 2024-07-22 NOTE — Progress Notes (Signed)
 Acute Office Visit  Subjective:     Patient ID: Paula Ward, female    DOB: 03/06/1936, 88 y.o.   MRN: [id number]  Chief Complaint  Patient presents with  . Medical Management of Chronic Issues    Sore on the middle of her back says its been there for a month , only hurts sometimes     HPI 88 year old female, patient of Dr. Rollene is in today with concerns of a sore in her back has been present for several months.  Has mild tenderness to the area.  Denies any drainage or discharge.  She has a bandage and has been applying antibiotic ointment to the area.  Has a history of skin cancers and has seen dermatology annually for skin checks.  She is here today with her home health aide  Review of Systems  Skin:        Sore on her back  All other systems reviewed and are negative.   Past Medical History:  Diagnosis Date  . Anemia   . Angiodysplasia of stomach and duodenum   . Aortic stenosis   . Arthus phenomenon   . AVM (arteriovenous malformation)   . Blood transfusion without reported diagnosis   . Breast cancer (HCC) 1989   Left  . Candida esophagitis (HCC)   . Cataract   . Chronic respiratory failure (HCC)   . CKD stage 3b, GFR 30-44 ml/min (HCC)   . Corneal edema   . Corneal epithelial basement membrane dystrophy   . CREST syndrome (HCC)   . GERD (gastroesophageal reflux disease)    w/ HH  . Interstitial lung disease (HCC)   . Nodule of kidney   . Pericardial effusion   . PONV (postoperative nausea and vomiting)   . Pulmonary embolus (HCC) 2003  . Pulmonary hypertension (HCC)   . Rectal incontinence   . Renal cell carcinoma (HCC)   . Scleroderma (HCC)   . Tubular adenoma of colon   . Uterine polyp     Social History   Socioeconomic History  . Marital status: Married    Spouse name: Not on file  . Number of children: 2  . Years of education: Not on file  . Highest education level: Not on file  Occupational History  . Occupation: Retired  Tobacco  Use  . Smoking status: Never  . Smokeless tobacco: Never  Vaping Use  . Vaping status: Never Used  Substance and Sexual Activity  . Alcohol use: No  . Drug use: No  . Sexual activity: Not Currently  Other Topics Concern  . Not on file  Social History Narrative   Married '611 son- '65, 1 daughter '63; 6 children (2 adopted)SO- SOBRetirement- doing well. Marriage in good health. Patient has never smoked. Alcohol use- noPt gets regular exercise   Social Drivers of Health   Financial Resource Strain: Low Risk  (04/02/2022)   Overall Financial Resource Strain (CARDIA)   . Difficulty of Paying Living Expenses: Not hard at all  Food Insecurity: Patient Declined (06/17/2024)   Hunger Vital Sign   . Worried About Programme researcher, broadcasting/film/video in the Last Year: Patient declined   . Ran Out of Food in the Last Year: Patient declined  Transportation Needs: No Transportation Needs (06/17/2024)   PRAPARE - Transportation   . Lack of Transportation (Medical): No   . Lack of Transportation (Non-Medical): No  Physical Activity: Inactive (04/02/2022)   Exercise Vital Sign   . Days of Exercise per  Week: 0 days   . Minutes of Exercise per Session: 0 min  Stress: No Stress Concern Present (04/02/2022)   [employer] of Occupational Health - Occupational Stress Questionnaire   . Feeling of Stress : Not at all  Social Connections: Socially Integrated (06/15/2024)   Social Connection and Isolation Panel   . Frequency of Communication with Friends and Family: More than three times a week   . Frequency of Social Gatherings with Friends and Family: More than three times a week   . Attends Religious Services: More than 4 times per year   . Active Member of Clubs or Organizations: Yes   . Attends Banker Meetings: More than 4 times per year   . Marital Status: Married  Catering manager Violence: Patient Declined (06/17/2024)   Humiliation, Afraid, Rape, and Kick questionnaire   . Fear of Current or  Ex-Partner: Patient declined   . Emotionally Abused: Patient declined   . Physically Abused: Patient declined   . Sexually Abused: Patient declined    Past Surgical History:  Procedure Laterality Date  . APPENDECTOMY  1962  . BREAST LUMPECTOMY  1989   left  . CATARACT EXTRACTION, BILATERAL Bilateral 12/2013  . COLONOSCOPY WITH PROPOFOL  N/A 04/15/2021   Procedure: COLONOSCOPY WITH PROPOFOL ;  Surgeon: Teressa Toribio SQUIBB, MD;  Location: [facility];  Service: Endoscopy;  Laterality: N/A;  . ENTEROSCOPY N/A 01/18/2016   Procedure: ENTEROSCOPY;  Surgeon: Gustav Shila GAILS, MD;  Location: [facility];  Service: Endoscopy;  Laterality: N/A;  . ENTEROSCOPY N/A 05/24/2018   Procedure: ENTEROSCOPY;  Surgeon: Charlanne Groom, MD;  Location: [facility];  Service: Endoscopy;  Laterality: N/A;  . ENTEROSCOPY N/A 03/29/2021   Procedure: ENTEROSCOPY;  Surgeon: Albertus Gordy HERO, MD;  Location: [facility];  Service: Gastroenterology;  Laterality: N/A;  . ENTEROSCOPY N/A 04/13/2021   Procedure: ENTEROSCOPY;  Surgeon: Leigh Elspeth SQUIBB, MD;  Location: [facility];  Service: Gastroenterology;  Laterality: N/A;  . ESOPHAGOGASTRODUODENOSCOPY (EGD) WITH PROPOFOL  N/A 12/21/2015   Procedure: ESOPHAGOGASTRODUODENOSCOPY (EGD) WITH PROPOFOL ;  Surgeon: Norleen LOISE Kiang, MD;  Location: [facility];  Service: Endoscopy;  Laterality: N/A;  . HOT HEMOSTASIS N/A 05/24/2018   Procedure: HOT HEMOSTASIS (ARGON PLASMA COAGULATION/BICAP);  Surgeon: Charlanne Groom, MD;  Location: [facility];  Service: Endoscopy;  Laterality: N/A;  . HOT HEMOSTASIS N/A 03/29/2021   Procedure: HOT HEMOSTASIS (ARGON PLASMA COAGULATION/BICAP);  Surgeon: Albertus Gordy HERO, MD;  Location: [facility];  Service: Gastroenterology;  Laterality: N/A;  . HOT HEMOSTASIS N/A 04/13/2021   Procedure: HOT HEMOSTASIS (ARGON PLASMA COAGULATION/BICAP);  Surgeon: Leigh Elspeth SQUIBB, MD;  Location: [facility];  Service: Gastroenterology;  Laterality: N/A;  . INTRAMEDULLARY  (IM) NAIL INTERTROCHANTERIC Left 10/11/2022   Procedure: INTRAMEDULLARY (IM) NAIL INTERTROCHANTERIC;  Surgeon: Melodi Lerner, MD;  Location: [facility];  Service: Orthopedics;  Laterality: Left;  . IR GENERIC HISTORICAL  06/05/2016   IR RADIOLOGIST EVAL & MGMT 06/05/2016 Marcey Moan, MD [facility] INTERV RAD  . IVC Filter    . KIDNEY SURGERY     left -laser surgery by Dr. moan- 5 yrs ago no removal  . RIGHT/LEFT HEART CATH AND CORONARY ANGIOGRAPHY N/A 04/25/2022   Procedure: RIGHT/LEFT HEART CATH AND CORONARY ANGIOGRAPHY;  Surgeon: Wonda Sharper, MD;  Location: [facility] INVASIVE CV LAB;  Service: Cardiovascular;  Laterality: N/A;  . SCHLEROTHERAPY  05/24/2018   Procedure: WALDEMAR;  Surgeon: Charlanne Groom, MD;  Location: [facility];  Service: Endoscopy;;  . SUBMUCOSAL TATTOO INJECTION  04/13/2021   Procedure: SUBMUCOSAL  TATTOO INJECTION;  Surgeon: Leigh Elspeth SQUIBB, MD;  Location: [facility];  Service: Gastroenterology;;  . TONSILLECTOMY    . TUBAL LIGATION      Family History  Problem Relation Age of Onset  . Bladder Cancer Father   . Diabetes Father   . Prostate cancer Father   . Alzheimer's disease Mother   . Diabetes Sister   . Lung cancer Sister         smoker  . Esophageal cancer Paternal Uncle   . Colon cancer Neg Hx     Allergies  Allergen Reactions  . Codeine  Nausea Only    Hallucinations, too  . Other Nausea And Vomiting and Other (See Comments)    -mycin antibiotics.   Also cause hallucinations.  . Erythromycin Nausea And Vomiting  . Iodinated Contrast Media Other (See Comments)    Renal issues  . Lisinopril Other (See Comments)    Pt doesn't remember reaction  . Mirtazapine  Other (See Comments)    Threw me into orbit  . Mycophenolate Mofetil Nausea Only    Made me nervous  . Sulfa  Antibiotics Other (See Comments)    Unknown reaction    Current Outpatient Medications on File Prior to Visit  Medication Sig Dispense Refill  . acetaminophen  (TYLENOL )  325 MG tablet Take 2 tablets (650 mg total) by mouth every 6 (six) hours as needed for moderate pain. 30 tablet 0  . albuterol  (PROVENTIL ) (2.5 MG/3ML) 0.083% nebulizer solution Take 3 mLs (2.5 mg total) by nebulization every 6 (six) hours as needed for wheezing or shortness of breath. 75 mL 12  . albuterol  (VENTOLIN  HFA) 108 (90 Base) MCG/ACT inhaler INHALE 2 PUFFS INTO THE LUNGS EVERY 6 HOURS AS NEEDED FOR WHEEZING OR SHORTNESS OF BREATH 6.7 g 3  . ALPRAZolam  (XANAX ) 0.25 MG tablet Take 1 tablet (0.25 mg total) by mouth at bedtime as needed for anxiety. 30 tablet 1  . arformoterol  (BROVANA ) 15 MCG/2ML NEBU Take 2 mLs (15 mcg total) by nebulization 2 (two) times daily. 120 mL 6  . augmented betamethasone dipropionate (DIPROLENE-AF) 0.05 % cream Apply 1 application  topically 2 (two) times daily as needed (for irritation).    . clobetasol ointment (TEMOVATE) 0.05 % Apply 1 Application topically 2 (two) times daily as needed (for irritation).    . diltiazem  (CARDIZEM ) 30 MG tablet Take 1 tablet (30 mg total) by mouth every 12 (twelve) hours. 60 tablet 11  . docusate sodium  (COLACE) 100 MG capsule Take 100 mg by mouth daily.    . famotidine  (PEPCID ) 20 MG tablet TAKE 1 TABLET BY MOUTH AT BEDTIME 30 tablet 0  . fluticasone  (FLONASE ) 50 MCG/ACT nasal spray USE 1 SPRAY(S) IN EACH NOSTRIL ONCE DAILY AS NEEDED FOR ALLERGIES 16 g 0  . furosemide  (LASIX ) 20 MG tablet Take 2 tablets ( 40 mg ) every morning may take extra 2 tablets for weight gain of 3 lbs 360 tablet 3  . ipratropium (ATROVENT ) 0.02 % nebulizer solution Take 2.5 mLs (0.5 mg total) by nebulization 2 (two) times daily. 450 mL 3  . latanoprost  (XALATAN ) 0.005 % ophthalmic solution Place 1 drop into both eyes at bedtime.    . Multiple Vitamin (MULTIVITAMIN WITH MINERALS) TABS tablet Take 1 tablet by mouth daily.    . Nutritional Supplements (ENSURE HIGH PROTEIN) LIQD Take 0.5 Bottles by mouth daily as needed (for supplementation).    . OXYGEN   Inhale 6 L/min into the lungs continuous.    . pantoprazole  (PROTONIX ) 40  MG tablet TAKE 1 TABLET BY MOUTH TWICE DAILY BEFORE A MEAL 180 tablet 0  . spironolactone  (ALDACTONE ) 25 MG tablet Take 1/2 tablet ( 12.5 mg ) every morning    . sucralfate  (CARAFATE ) 1 GM/10ML suspension TAKE 10 MLS BY MOUTH EVERY 6 HOURS AS NEEDED FOR REFLUX 420 mL 0  . triamcinolone  (KENALOG ) 0.1 % paste Use as directed 1 Application in the mouth or throat 2 (two) times daily. 5 g 12  . triamcinolone  cream (KENALOG ) 0.1 % Apply 1 Application topically See admin instructions. Apply to affected leg(s) after showers and up to 2 additional times a day as needed for irritation     Current Facility-Administered Medications on File Prior to Visit  Medication Dose Route Frequency Provider Last Rate Last Admin  . cyanocobalamin  (VITAMIN B12) injection 1,000 mcg  1,000 mcg Intramuscular Once Thayil, Irene T, PA-C        BP 120/78 (BP Location: Right Arm, Patient Position: Sitting, Cuff Size: Normal)   Pulse 70   Temp 97.7 F (36.5 C) (Oral)   Ht 5' 3 (1.6 m)   SpO2 97%   BMI 17.80 kg/m chart     Objective:    BP 120/78 (BP Location: Right Arm, Patient Position: Sitting, Cuff Size: Normal)   Pulse 70   Temp 97.7 F (36.5 C) (Oral)   Ht 5' 3 (1.6 m)   SpO2 97%   BMI 17.80 kg/m    Physical Exam Vitals reviewed.  Constitutional:      Appearance: Normal appearance. She is normal weight.  Cardiovascular:     Rate and Rhythm: Normal rate and regular rhythm.  Pulmonary:     Effort: Pulmonary effort is normal.     Breath sounds: Normal breath sounds.  Skin:    General: Skin is warm and dry.         Comments: Stage IV pressure ulcer noted.  No drainage or discharge.  Approximately 1.5 cm  Neurological:     General: No focal deficit present.     Mental Status: She is alert and oriented to person, place, and time.  Psychiatric:        Mood and Affect: Mood normal.        Behavior: Behavior normal.    No  results found for any visits on 07/22/24.      Assessment & Plan:   Problem List Items Addressed This Visit   None Visit Diagnoses       Pressure injury of back, stage 4 (HCC)    -  Primary      Patient admits to having a referral to the wound care center and her appointment is on Monday.  Advised to keep that appointment.  They will Take Care of the treatment regimen for her at that time.  In the meantime, educational information provided for preventing pressure ulcers.   No orders of the defined types were placed in this encounter.   No follow-ups on file.  Stefany Starace B Roger Fasnacht, FNP

## 2024-07-25 ENCOUNTER — Encounter (HOSPITAL_BASED_OUTPATIENT_CLINIC_OR_DEPARTMENT_OTHER): Attending: Internal Medicine | Admitting: Internal Medicine

## 2024-07-25 DIAGNOSIS — Z9981 Dependence on supplemental oxygen: Secondary | ICD-10-CM | POA: Insufficient documentation

## 2024-07-25 DIAGNOSIS — J849 Interstitial pulmonary disease, unspecified: Secondary | ICD-10-CM | POA: Diagnosis not present

## 2024-07-25 DIAGNOSIS — L89100 Pressure ulcer of unspecified part of back, unstageable: Secondary | ICD-10-CM | POA: Insufficient documentation

## 2024-07-25 DIAGNOSIS — M349 Systemic sclerosis, unspecified: Secondary | ICD-10-CM | POA: Diagnosis not present

## 2024-07-27 ENCOUNTER — Other Ambulatory Visit: Payer: Self-pay | Admitting: Physician Assistant

## 2024-07-27 DIAGNOSIS — D649 Anemia, unspecified: Secondary | ICD-10-CM

## 2024-07-28 ENCOUNTER — Inpatient Hospital Stay: Attending: Physician Assistant

## 2024-07-28 DIAGNOSIS — D631 Anemia in chronic kidney disease: Secondary | ICD-10-CM | POA: Diagnosis not present

## 2024-07-28 DIAGNOSIS — N1832 Chronic kidney disease, stage 3b: Secondary | ICD-10-CM | POA: Insufficient documentation

## 2024-07-28 DIAGNOSIS — D649 Anemia, unspecified: Secondary | ICD-10-CM

## 2024-07-28 LAB — CMP (CANCER CENTER ONLY)
ALT: 9 U/L (ref 0–44)
AST: 21 U/L (ref 15–41)
Albumin: 3.9 g/dL (ref 3.5–5.0)
Alkaline Phosphatase: 74 U/L (ref 38–126)
Anion gap: 6 (ref 5–15)
BUN: 63 mg/dL — ABNORMAL HIGH (ref 8–23)
CO2: 37 mmol/L — ABNORMAL HIGH (ref 22–32)
Calcium: 9.2 mg/dL (ref 8.9–10.3)
Chloride: 96 mmol/L — ABNORMAL LOW (ref 98–111)
Creatinine: 1.56 mg/dL — ABNORMAL HIGH (ref 0.44–1.00)
GFR, Estimated: 32 mL/min — ABNORMAL LOW (ref >60)
Glucose, Bld: 105 mg/dL — ABNORMAL HIGH (ref 70–99)
Potassium: 4.1 mmol/L (ref 3.5–5.1)
Sodium: 139 mmol/L (ref 135–145)
Total Bilirubin: 0.6 mg/dL (ref 0.0–1.2)
Total Protein: 6.7 g/dL (ref 6.5–8.1)

## 2024-07-28 LAB — CBC WITH DIFFERENTIAL (CANCER CENTER ONLY)
Abs Immature Granulocytes: 0.03 K/uL (ref 0.00–0.07)
Basophils Absolute: 0 K/uL (ref 0.0–0.1)
Basophils Relative: 1 %
Eosinophils Absolute: 0 K/uL (ref 0.0–0.5)
Eosinophils Relative: 0 %
HCT: 29.6 % — ABNORMAL LOW (ref 36.0–46.0)
Hemoglobin: 9.3 g/dL — ABNORMAL LOW (ref 12.0–15.0)
Immature Granulocytes: 1 %
Lymphocytes Relative: 10 %
Lymphs Abs: 0.7 K/uL (ref 0.7–4.0)
MCH: 35.4 pg — ABNORMAL HIGH (ref 26.0–34.0)
MCHC: 31.4 g/dL (ref 30.0–36.0)
MCV: 112.5 fL — ABNORMAL HIGH (ref 80.0–100.0)
Monocytes Absolute: 0.3 K/uL (ref 0.1–1.0)
Monocytes Relative: 5 %
Neutro Abs: 5.6 K/uL (ref 1.7–7.7)
Neutrophils Relative %: 83 %
Platelet Count: 204 K/uL (ref 150–400)
RBC: 2.63 MIL/uL — ABNORMAL LOW (ref 3.87–5.11)
RDW: 14.1 % (ref 11.5–15.5)
WBC Count: 6.6 K/uL (ref 4.0–10.5)
nRBC: 0 % (ref 0.0–0.2)

## 2024-08-01 DIAGNOSIS — I509 Heart failure, unspecified: Secondary | ICD-10-CM | POA: Diagnosis not present

## 2024-08-02 ENCOUNTER — Other Ambulatory Visit: Payer: Self-pay

## 2024-08-02 NOTE — Patient Outreach (Signed)
 Complex Care Management   Visit Note  08/03/2024  Name:  Paula Ward MRN: [id number] DOB: 1936/03/29  Situation: Referral received for Complex Care Management related to Heart Failure, ILD and Pulmonary HTN I obtained verbal consent from Patient.  Visit completed with Patient  on the phone  Background:   Past Medical History:  Diagnosis Date   Anemia    Angiodysplasia of stomach and duodenum    Aortic stenosis    Arthus phenomenon    AVM (arteriovenous malformation)    Blood transfusion without reported diagnosis    Breast cancer (HCC) 1989   Left   Candida esophagitis (HCC)    Cataract    Chronic respiratory failure (HCC)    CKD stage 3b, GFR 30-44 ml/min (HCC)    Corneal edema    Corneal epithelial basement membrane dystrophy    CREST syndrome (HCC)    GERD (gastroesophageal reflux disease)    w/ HH   Interstitial lung disease (HCC)    Nodule of kidney    Pericardial effusion    PONV (postoperative nausea and vomiting)    Pulmonary embolus (HCC) 2003   Pulmonary hypertension (HCC)    Rectal incontinence    Renal cell carcinoma (HCC)    Scleroderma (HCC)    Tubular adenoma of colon    Uterine polyp     Assessment: Paula Ward states she is at her baseline.  Patient Reported Symptoms:  Cognitive Cognitive Status: No symptoms reported      Neurological Neurological Review of Symptoms: No symptoms reported    HEENT HEENT Symptoms Reported: No symptoms reported      Cardiovascular Cardiovascular Symptoms Reported: No symptoms reported (patient denies any signs/symptoms of HF exacerbation) Does patient have uncontrolled Hypertension?: No Cardiovascular Management Strategies: Medication therapy  Respiratory Respiratory Symptoms Reported: Shortness of breath Other Respiratory Symptoms: patient reports SOB with activity. she states she is at her baseline. O2 3-4L/[place] and saturation 98-100%.    Endocrine Endocrine Symptoms Reported: No symptoms reported     Gastrointestinal Gastrointestinal Symptoms Reported: No symptoms reported      Genitourinary Genitourinary Symptoms Reported: No symptoms reported    Integumentary Integumentary Symptoms Reported: Wound Additional Integumentary Details: patient reports wound on her back and going to wound center improving. discussed protein rich food and nutritional food to help with wound healing    Musculoskeletal Musculoskelatal Symptoms Reviewed: No symptoms reported Additional Musculoskeletal Details: continues to use a cane. molibity limited due to health condition        Psychosocial Psychosocial Symptoms Reported: No symptoms reported Additional Psychological Details: Patient reports her husband recently home from SNF following a stroke. Paula Ward states assistance in the home around the clock at this time. RNCM discussed importance of having support in the home and getting adequate rest. discussed LCSW and Palliiative care for support resources-patient declines          There were no vitals filed for this visit.  Medications Reviewed Today     Reviewed by Kadin Bera M, RN (Registered Nurse) on 08/02/24 at 1058  Med List Status: <None>   Medication Order Taking? Sig Documenting Provider Last Dose Status Informant  acetaminophen  (TYLENOL ) 325 MG tablet [id number] Yes Take 2 tablets (650 mg total) by mouth every 6 (six) hours as needed for moderate pain. Christopher Savannah, PA-C  Active Self, Pharmacy Records           Med Note ALLEGRA GRASS I   Dju Aug 29, 2023 10:03 PM)  albuterol  (PROVENTIL ) (2.5 MG/3ML) 0.083% nebulizer solution [id number] Yes Take 3 mLs (2.5 mg total) by nebulization every 6 (six) hours as needed for wheezing or shortness of breath. Sonjia Held, MD  Active Self, Pharmacy Records  albuterol  (VENTOLIN  HFA) 108 [phone] Base) MCG/ACT inhaler [id number] Yes INHALE 2 PUFFS INTO THE LUNGS EVERY 6 HOURS AS NEEDED FOR WHEEZING OR SHORTNESS OF SHERIDA Rollene Almarie DELENA, MD   Active Self, Pharmacy Records  ALPRAZolam  (XANAX ) 0.25 MG tablet [id number] Yes Take 1 tablet (0.25 mg total) by mouth at bedtime as needed for anxiety. Rollene Almarie DELENA, MD  Active   arformoterol  (BROVANA ) 15 MCG/2ML NEBU [id number] Yes Take 2 mLs (15 mcg total) by nebulization 2 (two) times daily. Hunsucker, Donnice SAUNDERS, MD  Active Self, Pharmacy Records  augmented betamethasone dipropionate (DIPROLENE-AF) 0.05 % cream [id number] Yes Apply 1 application  topically 2 (two) times daily as needed (for irritation). [provider]  Active Self, Pharmacy Records           Med Note ALLEGRA, [facility] I   Sat Aug 29, 2023  9:58 PM)    clobetasol ointment (TEMOVATE) 0.05 % [id number] Yes Apply 1 Application topically 2 (two) times daily as needed (for irritation). [provider]  Active Self, Pharmacy Records           Med Note ALLEGRA, [facility] I   Sat Aug 29, 2023  9:59 PM)    cyanocobalamin  (VITAMIN B12) injection 1,000 mcg [id number]   Thayil, Irene T, PA-C  Active   diltiazem  (CARDIZEM ) 30 MG tablet [id number] Yes Take 1 tablet (30 mg total) by mouth every 12 (twelve) hours. Emelia Josefa HERO, NP  Active Self, Pharmacy Records  docusate sodium  (COLACE) 100 MG capsule [id number] Yes Take 100 mg by mouth daily. [provider]  Active Self, Pharmacy Records  famotidine  (PEPCID ) 20 MG tablet [id number] Yes TAKE 1 TABLET BY MOUTH AT BEDTIME Nandigam, Kavitha V, MD  Active Self, Pharmacy Records  fluticasone  (FLONASE ) 50 MCG/ACT nasal spray [id number] Yes USE 1 SPRAY(S) IN EACH NOSTRIL ONCE DAILY AS NEEDED FOR ALLERGIES Rollene Almarie DELENA, MD  Active Self, Pharmacy Records  furosemide  (LASIX ) 20 MG tablet [id number] Yes Take 2 tablets ( 40 mg ) every morning may take extra 2 tablets for weight gain of 3 lbs Pietro Redell RAMAN, MD  Active Self, Pharmacy Records  ipratropium (ATROVENT ) 0.02 % nebulizer solution [id number] Yes Take 2.5 mLs (0.5 mg total) by nebulization 2 (two) times  daily. Hunsucker, Donnice SAUNDERS, MD  Active Self, Pharmacy Records  latanoprost  (XALATAN ) 0.005 % ophthalmic solution [id number] Yes Place 1 drop into both eyes at bedtime. [provider]  Active Self, Pharmacy Records  Multiple Vitamin (MULTIVITAMIN WITH MINERALS) TABS tablet [id number] Yes Take 1 tablet by mouth daily. [provider]  Active Self, Pharmacy Records  Nutritional Supplements (ENSURE HIGH PROTEIN) LIQD [id number] Yes Take 0.5 Bottles by mouth daily as needed (for supplementation). [provider]  Active Self, Pharmacy Records  OXYGEN  [id number]  Inhale 6 L/min into the lungs continuous. [provider]  Active Self, Pharmacy Records           Med Note MARISA, NATHANEL SAILOR   Wed Mar 23, 2024  7:28 PM)    pantoprazole  (PROTONIX ) 40 MG tablet [id number] Yes TAKE 1 TABLET BY MOUTH TWICE DAILY BEFORE A MEAL Nandigam, Kavitha V, MD  Active   spironolactone  (ALDACTONE ) 25 MG tablet [id number] Yes Take 1/2 tablet ( 12.5 mg ) every morning Pietro Redell  GORMAN, MD  Active Self, Pharmacy Records           Med Note RENNIS, DUROJAHYE' R   Wed Jun 15, 2024  3:37 AM)    sucralfate  (CARAFATE ) 1 GM/10ML suspension [id number] Yes TAKE 10 MLS BY MOUTH EVERY 6 HOURS AS NEEDED FOR REFLUX Nandigam, Kavitha V, MD  Active Self, Pharmacy Records  triamcinolone  (KENALOG ) 0.1 % paste [id number] Yes Use as directed 1 Application in the mouth or throat 2 (two) times daily. Rollene Almarie LABOR, MD  Active   triamcinolone  cream (KENALOG ) 0.1 % [id number]  Apply 1 Application topically See admin instructions. Apply to affected leg(s) after showers and up to 2 additional times a day as needed for irritation [provider]  Active Self, Pharmacy Records           Med Note ALLEGRA GRASS I   Sat Aug 29, 2023 10:03 PM)            Recommendation:   Continue Current Plan of Care  Follow Up Plan:   Telephone follow up appointment date/time:  09/01/24 at 11:00 am  Heddy Shutter, RN, MSN, BSN, CCM   [facility], Population Health Case Manager Phone: [phone]

## 2024-08-03 NOTE — Patient Instructions (Addendum)
 Visit Information  Thank you for taking time to visit with me today. Please don't hesitate to contact me if I can be of assistance to you before our next scheduled appointment.  Your next care management appointment is by telephone on 09/01/24 at 11:00 am  Please call the care guide team at [phone] if you need to cancel, schedule, or reschedule an appointment.   Please call the Suicide and Crisis Lifeline: 988 call the [place]  National Suicide Prevention Lifeline: 808-119-4707 or TTY: (902)869-4728 TTY 6616650820) to talk to a trained counselor if you are experiencing a Mental Health or Behavioral Health Crisis or need someone to talk to.  Heddy Shutter, RN, MSN, BSN, CCM [place]  [facility], Population Health Case Manager Phone: [phone]

## 2024-08-03 NOTE — Progress Notes (Signed)
 Cardiology Clinic Note   Patient Name: Paula Ward Date of Encounter: 08/05/2024  Primary Care Provider:  Rollene Almarie LABOR, MD Primary Cardiologist:  Redell Shallow, MD  Patient Profile    Paula Ward 88 year old female presents the clinic today for follow-up evaluation of her lower extremity edema/chronic diastolic CHF, and posthospital follow-up.  Past Medical History    Past Medical History:  Diagnosis Date   Anemia    Angiodysplasia of stomach and duodenum    Aortic stenosis    Arthus phenomenon    AVM (arteriovenous malformation)    Blood transfusion without reported diagnosis    Breast cancer (HCC) 1989   Left   Candida esophagitis (HCC)    Cataract    Chronic respiratory failure (HCC)    CKD stage 3b, GFR 30-44 ml/min (HCC)    Corneal edema    Corneal epithelial basement membrane dystrophy    CREST syndrome (HCC)    GERD (gastroesophageal reflux disease)    w/ HH   Interstitial lung disease (HCC)    Nodule of kidney    Pericardial effusion    PONV (postoperative nausea and vomiting)    Pulmonary embolus (HCC) 2003   Pulmonary hypertension (HCC)    Rectal incontinence    Renal cell carcinoma (HCC)    Scleroderma (HCC)    Tubular adenoma of colon    Uterine polyp    Past Surgical History:  Procedure Laterality Date   APPENDECTOMY  1962   BREAST LUMPECTOMY  1989   left   CATARACT EXTRACTION, BILATERAL Bilateral 12/2013   COLONOSCOPY WITH PROPOFOL  N/A 04/15/2021   Procedure: COLONOSCOPY WITH PROPOFOL ;  Surgeon: Teressa Toribio SQUIBB, MD;  Location: [facility];  Service: Endoscopy;  Laterality: N/A;   ENTEROSCOPY N/A 01/18/2016   Procedure: ENTEROSCOPY;  Surgeon: Gustav Shila GAILS, MD;  Location: [facility];  Service: Endoscopy;  Laterality: N/A;   ENTEROSCOPY N/A 05/24/2018   Procedure: ENTEROSCOPY;  Surgeon: Charlanne Groom, MD;  Location: [facility];  Service: Endoscopy;  Laterality: N/A;   ENTEROSCOPY N/A 03/29/2021   Procedure:  ENTEROSCOPY;  Surgeon: Albertus Gordy HERO, MD;  Location: [facility];  Service: Gastroenterology;  Laterality: N/A;   ENTEROSCOPY N/A 04/13/2021   Procedure: ENTEROSCOPY;  Surgeon: Leigh Elspeth SQUIBB, MD;  Location: [facility];  Service: Gastroenterology;  Laterality: N/A;   ESOPHAGOGASTRODUODENOSCOPY (EGD) WITH PROPOFOL  N/A 12/21/2015   Procedure: ESOPHAGOGASTRODUODENOSCOPY (EGD) WITH PROPOFOL ;  Surgeon: Norleen LOISE Kiang, MD;  Location: [facility];  Service: Endoscopy;  Laterality: N/A;   HOT HEMOSTASIS N/A 05/24/2018   Procedure: HOT HEMOSTASIS (ARGON PLASMA COAGULATION/BICAP);  Surgeon: Charlanne Groom, MD;  Location: [facility];  Service: Endoscopy;  Laterality: N/A;   HOT HEMOSTASIS N/A 03/29/2021   Procedure: HOT HEMOSTASIS (ARGON PLASMA COAGULATION/BICAP);  Surgeon: Albertus Gordy HERO, MD;  Location: [facility];  Service: Gastroenterology;  Laterality: N/A;   HOT HEMOSTASIS N/A 04/13/2021   Procedure: HOT HEMOSTASIS (ARGON PLASMA COAGULATION/BICAP);  Surgeon: Leigh Elspeth SQUIBB, MD;  Location: [facility];  Service: Gastroenterology;  Laterality: N/A;   INTRAMEDULLARY (IM) NAIL INTERTROCHANTERIC Left 10/11/2022   Procedure: INTRAMEDULLARY (IM) NAIL INTERTROCHANTERIC;  Surgeon: Melodi Lerner, MD;  Location: [facility];  Service: Orthopedics;  Laterality: Left;   IR GENERIC HISTORICAL  06/05/2016   IR RADIOLOGIST EVAL & MGMT 06/05/2016 Marcey Moan, MD [facility] INTERV RAD   IVC Filter     KIDNEY SURGERY     left -laser surgery by Dr. moan- 5 yrs ago no removal   RIGHT/LEFT HEART  CATH AND CORONARY ANGIOGRAPHY N/A 04/25/2022   Procedure: RIGHT/LEFT HEART CATH AND CORONARY ANGIOGRAPHY;  Surgeon: Wonda Sharper, MD;  Location: [facility]AS[facility] CV [facility];  Service: Cardiovascular;  Laterality: N/A;   SCHLEROTHERAPY  05/24/2018   Procedure: WALDEMAR;  Surgeon: Charlanne Groom, MD;  Location: [facility];  Service: Endoscopy;;   SUBMUCOSAL TATTOO INJECTION  04/13/2021   Procedure: SUBMUCOSAL TATTOO INJECTION;   Surgeon: Leigh Elspeth SQUIBB, MD;  Location: [facility];  Service: Gastroenterology;;   TONSILLECTOMY     TUBAL LIGATION      Allergies  Allergies  Allergen Reactions   Codeine  Nausea Only    Hallucinations, too   Other Nausea And Vomiting and Other (See Comments)    -mycin antibiotics.   Also cause hallucinations.   Erythromycin Nausea And Vomiting   Iodinated Contrast Media Other (See Comments)    Renal issues   Lisinopril Other (See Comments)    Pt doesn't remember reaction   Mirtazapine  Other (See Comments)    Threw me into orbit   Mycophenolate Mofetil Nausea Only    Made me nervous   Sulfa  Antibiotics Other (See Comments)    Unknown reaction    History of Present Illness    Paula Ward 88 year old female has a PMH of essential hypertension, pulmonary hypertension, pericardial effusion, chronic diastolic CHF, AVM, moderate aortic stenosis, orthostatic hypotension, interstitial lung disease, GERD, CKD stage III, PE, incontinence, scleroderma, anemia, generalized weakness, vitamin B12 deficiency, and macrocytic anemia.  Her PMH also includes gastrointestinal bleeding secondary to AVM malformations which required IVC filter placement and breast CA.  She underwent cardiac catheterization 7/23 which showed no obstructive coronary disease, pulmonary hypertension, moderate aortic stenosis with a mean gradient of 16 mmHg.     She was seen in the emergency room on 06/04/2023 due to increased shortness of breath, chest tightness, and lower extremity edema.  Her oxygen  saturation was noted to be 80% on room air.  Her BNP was 2680.  Her chest x-ray showed stable cardiomegaly and pericardial effusion with no pulmonary edema or pulmonary effusion.  Echocardiogram during that time showed LVEF of 70-75%, moderate concentric LVH, mildly reduced RV function and severely dilated LA.  Large pericardial effusion was noted.  This was slightly increased from June.  There was no evidence of  tamponade or severe MAC and moderate AS was noted.  She received IV diuresis and was discharged in stable condition 06/06/2023.  She was seen in follow-up by Katlyn West NP on 06/19/2023.  She reported that she was doing well from a cardiac standpoint.  She denied chest pain and felt her breathing had returned to baseline.  She had been monitoring her weights at home which were stable.  She was having regular lab work at the cancer center and had planned lab work on 06/30/2023.  She contacted the nurse triage line 08/04/2023 and reported that she had increased lower extremity swelling.  She reported a 10 pound weight gain since 8/15 and another 2 pound weight gain since 9/25.  She was taking furosemide  40 mg once daily.  She continued to use lower extremity support stockings.  She was instructed to increase her furosemide  to 40 mg twice daily.  She was added to my schedule.  She presented to the clinic 08/06/23  for follow-up evaluation and stated she noticed a 6 pound weight gain overnight.  She reported that she was around 100 pounds earlier in the week.  Today in the office her weight is 108.2 pounds.  She reported that she was not able to take 40 mg of Lasix  2 times per day so she has been taking 60 mg of Lasix  for the past 2 days.  Her right leg is greater than her left.  She has noted to have +1-2 pitting edema to her knee her right leg and generalized nonpitting edema on her left leg.  I will continued her 60 mg of Lasix  x 4 days and give her 20 mill equivalents of potassium daily. I planned repeat  BMP 1 week and planned follow-up in 1 week.   She had follow-up appointment with pulmonology that week and was stable using 3 L of oxygen  nasal cannula while active.  She presented to the emergency department on 08/13/2023.  She was diagnosed with right lower extremity cellulitis.  She received IV antibiotics and diuresis.  Her furosemide  was transitioned to torsemide .  Her echocardiogram showed known severe  LVH, G1 DD, large pericardial effusion without tamponade.  Her lower extremity ultrasound was negative for DVT.  There was low suspicion for true cellulitis however her Keflex  was continued at discharge.  She presented to the clinic  08/21/23 for follow-up evaluation and stated she had not had good urine output with torsemide .  She had been monitoring her weight at home.  She reported that when she left the hospital her weight was 93-1/2 pounds.  It had steadily increased up to 106 pounds today.  Our clinic scale showed 108 pounds.  She had been weighing after voiding in the morning with the same shoes on.  She presented with her son who asked several questions about her fluid retention and pulmonary hypertension.  All questions were answered.  I asked her to continue daily weights, stopped torsemide  and resumed furosemide  60 mg daily, ordered BMP, kept follow-up for November 14, and asked her follow-up with nephrology as scheduled.  She was admitted to the hospital on 08/29/2023.  Her weight was noted to be around 102 pounds 12 ounces.  Her echocardiogram 08/31/2023 showed an LVEF of 60-65%, mildly dilated left atria, severe mitral annular calcification with mitral stenosis.  Severe calcification of the aortic valve was also noted.  She was noted to have large circumferential pericardial effusion.  No RV diastolic collapse was noted.  She had been readmitted for weight gain, orthopnea, edema.  Her chest x-ray showed improving but persistent CHF and small pleural effusion.  She received IV diuresis.  Her SGLT2 inhibitor was restarted 08/31/2023.  She was continued on spironolactone .  Close follow-up was recommended.  She presented to the clinic 09/09/23 for follow-up evaluation and stated she felt much better  since being discharged from the hospital.  Her weight had been stable at home.  She did have some bilateral ankle edema.  She had been compliant with her medications.  She had been stable with her weight.   We reviewed her medications and she felt much better since being started on Jardiance .  We reviewed her hospitalizations and diastolic heart failure.  By discussed referral for palliative care.  She agreed to meet with them and discuss options for management/treatment.  I had her continue her weight log, gave iron  rich foods list and asked her to pare them with vitamin C. We plan follow-up in 2 to 3 months.  She reported that she had upcoming appointment with hematology.   She was not able to tolerate Jardiance .  It was discontinued.  I changed her furosemide  to 60 mg Monday Wednesday Friday and continue to  her furosemide  40 mg dosing on Tuesday Thursday Saturday Sunday.  I planned BMP with return to the clinic.      She presented to the clinic 10/30/23 for follow-up evaluation and stated she was not doing well with Jardiance .  She had been maintaining a weight log which showed a weight of 98.2 pounds at home.  Her weight in the clinic was 101.8 pounds.  She reported that she had not been taking spironolactone  for about 3 to 4 weeks.  I refilled her spironolactone .  She was on 3-4 L of oxygen .  She had upcoming appointment with pulmonology.  She did note that she was somewhat fatigued.  She had upcoming echocardiogram planned for February.  I  decreased her Lasix  to 40 mg daily,  ordered BMP and planned follow-up in 2 to 4 months.  She presented to the emergency department 12/04/2023.  With large pericardial effusion that had been diagnosed/seen on echo 12/03/2023.  Her EKG at that time showed sinus rhythm with PACs 80 bpm.  She was without complaint on exam.  She did note some mild shortness of breath.  She was maintained on 2 L of oxygen .  She denied any active chest discomfort.  She denied fever, productive cough, lightheadedness, dizziness, nausea vomiting and back pain.  Rales were noted at lung bases.  Chest x-ray showed cardiomegaly which was associated with underlying pericardial effusion.  Mild  pulmonary edema was noted.  Dr. Verlin evaluated patient and felt that her pericardial effusion was chronic in nature.  She presented to the clinic 12/28/23 for follow-up evaluation and stated she was now using 3 L of oxygen  via nasal cannula with activity.  In the office  she was not using her oxygen  in the exam room.  Her oxygen  saturation was 94%.  Her blood pressure was well-controlled.  We reviewed her recent echocardiogram and emergency department visit.  She reported that she had an appointment with nephrology on Wednesday.  She was tolerating her medications well without side effects. Follow-up in 4 to 6 months was planned.  She presented to the clinic 06/14/24 for follow-up evaluation and stated she  had been requiring increased nasal cannula oxygen .  She reported compliance with her CPAP.  She had been using this with naps as well.  She did note that she was having increased confusion and shortness of breath.  She noted that morning she took her furosemide  along with her spironolactone .  Her weight was slightly up from previous (about 1 pound) on exam she had bilateral ankle/shin edema.  She had Rales throughout all lung fields.  In the clinic today she was using 5 L nasal cannula.  She noted that she had the same symptoms prior to her previous hospitalization.  She requested that she be placed on BiPAP after she touched base with her husband and family.  We reviewed her previous clinic visit and past medical history.  We reviewed her recent lab work which shows stable CO2.  I  contacted card master and planned follow-up after hospitalization.  I  recommended that she have BiPAP, IV diuresis, CBC, CMP, and have palliative care/ hospice care consult.  She was diagnosed with COVID-19.  She was admitted for monitoring.  It was felt that her acute on chronic hypoxic respiratory failure was exacerbated by COVID-19 infection.  She was started on Decadron  as well as remdesivir .  She was noted to have rapid  clinical improvement.  She received IV diuresis with good clinical response.  She  was instructed to continue Decadron  for 10 days and Paxlovid  for 5 days.  She was admitted on 05/26/2024 and discharged on 06/16/2024.  She followed up with her PCP on 06/28/2024.  Her CMP showed elevated creatinine and a GFR of 25.65.  Repeat BMP in 2 weeks was recommended.  Her hemoglobin and hematocrit were fairly stable.  She presents to the clinic today for follow-up evaluation and states she feels well.  She drove to the clinic today on her own.  She notes that she is feeling much better since she was in the hospital.  She notes that she is unable to gain weight.  She has been drinking more milkshakes.  She continues with 3 L of oxygen  via nasal cannula.  She is wearing her BiPAP at night.  She does have some increased stress related to her husband's health.  They now have assistance in their home with his care.  She reports that he has 3 doctors visits next week.  Her daughter is coming to help with these.  I will continue her current medication regimen, have her maintain her physical activity and plan follow-up in 4 to 6 months.  Today she denies chest pain, increased shortness of breath, lower extremity edema, fatigue, palpitations, melena, hematuria, hemoptysis, diaphoresis, weakness, presyncope, syncope, orthopnea, and PND.    Home Medications    Prior to Admission medications   Medication Sig Start Date End Date Taking? Authorizing Provider  acetaminophen  (TYLENOL ) 325 MG tablet Take 2 tablets (650 mg total) by mouth every 6 (six) hours as needed for moderate pain. 07/18/22   Christopher Savannah, PA-C  albuterol  (VENTOLIN  HFA) 108 (90 Base) MCG/ACT inhaler INHALE 2 PUFFS INTO THE LUNGS EVERY 6 HOURS AS NEEDED FOR WHEEZING OR SHORTNESS OF BREATH 03/13/23   Rollene Almarie LABOR, MD  augmented betamethasone dipropionate (DIPROLENE-AF) 0.05 % cream Apply topically 2 (two) times daily as needed. 07/13/23   [provider]  clobetasol ointment (TEMOVATE) 0.05 % Apply 1 Application topically 2 (two) times daily. 04/20/23   [provider]  Cyanocobalamin  (VITAMIN B-12) 1000 MCG SUBL Place 1,000 mcg under the tongue. Taking twice a week    [provider]  docusate sodium  (COLACE) 100 MG capsule Take 1 capsule (100 mg total) by mouth 2 (two) times daily. Patient taking differently: Take 100 mg by mouth 2 (two) times daily. Pt takes every morning 10/15/22   Edmisten, Kristie L, PA  famotidine  (PEPCID ) 20 MG tablet Take 1 tablet (20 mg total) by mouth at bedtime. 02/10/23   Nandigam, Kavitha V, MD  fluticasone  (FLONASE ) 50 MCG/ACT nasal spray Place 1 spray into both nostrils daily as needed for allergies. Patient taking differently: Place 1 spray into both nostrils in the morning and at bedtime. 05/15/22   Rollene Almarie LABOR, MD  furosemide  (LASIX ) 20 MG tablet Take 2 tablets (40 mg total) by mouth daily. 06/19/23   West, Katlyn D, NP  guaiFENesin  (MUCINEX ) 600 MG 12 hr tablet Take 300 mg by mouth daily.    [provider]  ipratropium (ATROVENT ) 0.03 % nasal spray Place 2 sprays into both nostrils every 12 (twelve) hours. 05/12/22   Kara Dorn NOVAK, MD  latanoprost  (XALATAN ) 0.005 % ophthalmic solution Place 1 drop into both eyes at bedtime. 09/23/22   [provider]  magic mouthwash SOLN Take 10 mLs by mouth 4 (four) times daily as needed for mouth pain. 03/22/22   Adhikari, Amrit, MD  mirtazapine  (REMERON ) 15 MG tablet Take 1  tablet (15 mg total) by mouth at bedtime. 03/13/23   Rollene Almarie LABOR, MD  montelukast  (SINGULAIR ) 10 MG tablet TAKE 1 TABLET BY MOUTH AT BEDTIME 07/31/23   Rollene Almarie LABOR, MD  Multiple Vitamin (MULTIVITAMIN) capsule Take 1 capsule by mouth daily.    [provider]  mupirocin  ointment (BACTROBAN ) 2 % Apply 1 Application topically 2 (two) times daily as needed (dry skin).    [provider]  OPSUMIT  10 MG tablet Take 1 tablet (10 mg  total) by mouth daily. 07/07/23   Parrett, Madelin RAMAN, NP  OXYGEN  Inhale into the lungs. 2 LMP at night and upon exertion    [provider]  pantoprazole  (PROTONIX ) 40 MG tablet Take 1 tablet (40 mg total) by mouth daily. 02/10/23   Nandigam, Kavitha V, MD  polyethylene glycol (MIRALAX  / GLYCOLAX ) 17 g packet Take 17 g by mouth daily. 10/15/22   Edmisten, Kristie L, PA  spironolactone  (ALDACTONE ) 25 MG tablet Take 1 tablet (25 mg total) by mouth daily. 08/03/23   Merlynn Niki FALCON, FNP  sucralfate  (CARAFATE ) 1 GM/10ML suspension TAKE 10 MLS BY MOUTH EVERY 6 HOURS AS NEEDED FOR REFLUX 02/11/23   Nandigam, Kavitha V, MD  triamcinolone  cream (KENALOG ) 0.1 % Apply 1 Application topically 2 (two) times daily. 07/07/23   [provider]    Family History    Family History  Problem Relation Age of Onset   Bladder Cancer Father    Diabetes Father    Prostate cancer Father    Alzheimer's disease Mother    Diabetes Sister    Lung cancer Sister         smoker   Esophageal cancer Paternal Uncle    Colon cancer Neg Hx    She indicated that her mother is deceased. She indicated that her father is deceased. She indicated that the status of her sister is unknown. She indicated that the status of her paternal uncle is unknown. She indicated that the status of her neg hx is unknown.  Social History    Social History   Socioeconomic History   Marital status: Married    Spouse name: Not on file   Number of children: 2   Years of education: Not on file   Highest education level: Not on file  Occupational History   Occupation: Retired  Tobacco Use   Smoking status: Never   Smokeless tobacco: Never  Vaping Use   Vaping status: Never Used  Substance and Sexual Activity   Alcohol use: No   Drug use: No   Sexual activity: Not Currently  Other Topics Concern   Not on file  Social History Narrative   Married '611 son- '65, 1 daughter '63; 6 children (2 adopted)SO- SOBRetirement- doing  well. Marriage in good health. Patient has never smoked. Alcohol use- noPt gets regular exercise   Social Drivers of Health   Financial Resource Strain: Low Risk  (04/02/2022)   Overall Financial Resource Strain (CARDIA)    Difficulty of Paying Living Expenses: Not hard at all  Food Insecurity: Patient Declined (06/17/2024)   Hunger Vital Sign    Worried About Running Out of Food in the Last Year: Patient declined    Ran Out of Food in the Last Year: Patient declined  Transportation Needs: No Transportation Needs (06/17/2024)   PRAPARE - Administrator, Civil Service (Medical): No    Lack of Transportation (Non-Medical): No  Physical Activity: Inactive (04/02/2022)   Exercise Vital Sign  Days of Exercise per Week: 0 days    Minutes of Exercise per Session: 0 min  Stress: No Stress Concern Present (04/02/2022)   [employer] of Occupational Health - Occupational Stress Questionnaire    Feeling of Stress : Not at all  Social Connections: Socially Integrated (06/15/2024)   Social Connection and Isolation Panel    Frequency of Communication with Friends and Family: More than three times a week    Frequency of Social Gatherings with Friends and Family: More than three times a week    Attends Religious Services: More than 4 times per year    Active Member of [employer] or Organizations: Yes    Attends Banker Meetings: More than 4 times per year    Marital Status: Married  Catering manager Violence: Patient Declined (06/17/2024)   Humiliation, Afraid, Rape, and Kick questionnaire    Fear of Current or Ex-Partner: Patient declined    Emotionally Abused: Patient declined    Physically Abused: Patient declined    Sexually Abused: Patient declined     Review of Systems    General:  No chills, fever, night sweats or weight changes.  Cardiovascular:  No chest pain, dyspnea on exertion, edema, orthopnea, palpitations, paroxysmal nocturnal dyspnea. Dermatological: No  rash, lesions/masses Respiratory: No cough, dyspnea Urologic: No hematuria, dysuria Abdominal:   No nausea, vomiting, diarrhea, bright red blood per rectum, melena, or hematemesis Neurologic:  No visual changes, wkns, changes in mental status. All other systems reviewed and are otherwise negative except as noted above.  Physical Exam    VS:  BP 118/78   Pulse 86   Ht 5' (1.524 m)   Wt 97 lb 9.6 oz (44.3 kg)   SpO2 98%   BMI 19.06 kg/m  , BMI Body mass index is 19.06 kg/m. GEN: Well nourished, well developed, in no acute distress. HEENT: normal. Neck: Supple, no JVD, carotid bruits, or masses. Cardiac: RRR, 4/6 systolicmurmur heard along right sternal border, rubs, or gallops. No clubbing, cyanosis, Bilateral trace ankle edema left greater than right.  Radials/DP/PT 2+ and equal bilaterally.  Respiratory:  Respirations somewhat labored with crackles throughout all lung fields, at baseline. GI: Soft, nontender, nondistended, BS + x 4. MS: no deformity or atrophy. Skin: warm and dry, no rash. Neuro:  Strength and sensation are intact. Psych: Normal affect.  Accessory Clinical Findings    Recent Labs: 03/23/2024: B Natriuretic Peptide 2,711.9; TSH 5.953 06/15/2024: Magnesium 2.0 06/16/2024: Pro Brain Natriuretic Peptide 12,611.0 07/28/2024: ALT 9; BUN 63; Creatinine 1.56; Hemoglobin 9.3; Platelet Count 204; Potassium 4.1; Sodium 139   Recent Lipid Panel    Component Value Date/Time   CHOL 158 06/24/2018 1004   TRIG 112.0 06/24/2018 1004   HDL 57.50 06/24/2018 1004   CHOLHDL 3 06/24/2018 1004   VLDL 22.4 06/24/2018 1004   LDLCALC 78 06/24/2018 1004         ECG personally reviewed by me today-  none today.     Echocardiogram 06/05/2023  IMPRESSIONS     1. Left ventricular ejection fraction, by estimation, is 70 to 75%. The  left ventricle has hyperdynamic function. The left ventricle has no  regional wall motion abnormalities. There is moderate concentric left   ventricular hypertrophy. Indeterminate  diastolic filling due to E-A fusion.   2. Right ventricular systolic function is mildly reduced. The right  ventricular size is normal.   3. Left atrial size was severely dilated.   4. Large pericardial effusion surrounds heart, mainly posteriorly  and  lateral to LV. Naxunak dimension is 5.6 cm posteriorly. It has increased  in size from echo done in June 2024. Does not meet criteria for tamponade  by echo criteria. Clinical  correlation indicated .SABRA Large pericardial effusion.   5. MV annulus is severely calcified. MV is thickened with restricted  motion. Difficult to see well Mean gradient through the MV is 8 mm Hg. MVA  (Pt1/2 ) 3 cm2. Compared to echo report from June 2024, mean gradient is  incresaed (5 to 8 mm Hg) Consider  TEE to evaluate further.. Trivial mitral valve regurgitation.   6. AV is thickened, calcified with restricted motion. Peak and mean  gradients through the valve are 59 and 37 mm Hg respectively.  Dimensionless index is 0.33 consistent with moderate AS.SABRA The aortic valve  is abnormal. Aortic valve regurgitation is not  visualized.   7. The inferior vena cava is normal in size with <50% respiratory  variability, suggesting right atrial pressure of 8 mmHg.   FINDINGS   Left Ventricle: Left ventricular ejection fraction, by estimation, is 70  to 75%. The left ventricle has hyperdynamic function. The left ventricle  has no regional wall motion abnormalities. The left ventricular internal  cavity size was small. There is  moderate concentric left ventricular hypertrophy. Indeterminate diastolic  filling due to E-A fusion.   Right Ventricle: The right ventricular size is normal. Right vetricular  wall thickness was not assessed. Right ventricular systolic function is  mildly reduced.   Left Atrium: Left atrial size was severely dilated.   Right Atrium: Right atrial size was normal in size.   Pericardium: Large  pericardial effusion surrounds heart, mainly  posteriorly and lateral to LV. [place] dimension is 5.6 cm posteriorly. It  has increased in size from echo done in June 2024. Does not meet criteria  for tamponade by echo criteria. Clinical  correlation indicated. A large pericardial effusion is present.   Mitral Valve: MV annulus is severely calcified. MV is thickened with  restricted motion. Difficult to see well Mean gradient through the MV is 8  mm Hg. MVA (Pt1/2 ) 3 cm2. Compared to echo report from June 2024, mean  gradient is incresaed (5 to 8 mm Hg)  Consider TEE to evaluate further. Trivial mitral valve regurgitation. MV  peak gradient, 13.2 mmHg. The mean mitral valve gradient is 8.0 mmHg.   Tricuspid Valve: The tricuspid valve is normal in structure. Tricuspid  valve regurgitation is mild.   Aortic Valve: AV is thickened, calcified with restricted motion. Peak and  mean gradients through the valve are 59 and 37 mm Hg respectively.  Dimensionless index is 0.33 consistent with moderate AS. The aortic valve  is abnormal. Aortic valve regurgitation   is not visualized. Aortic valve mean gradient measures 37.0 mmHg. Aortic  valve peak gradient measures 58.7 mmHg. Aortic valve area, by VTI measures  0.95 cm.   Pulmonic Valve: The pulmonic valve was not well visualized. Pulmonic valve  regurgitation is mild.   Aorta: The aortic root and ascending aorta are structurally normal, with  no evidence of dilitation.   Venous: The inferior vena cava is normal in size with less than 50%  respiratory variability, suggesting right atrial pressure of 8 mmHg.   IAS/Shunts: No atrial level shunt detected by color flow Doppler.   Echocardiogram 08/15/2023 IMPRESSIONS     1. Left ventricular ejection fraction, by estimation, is >75%. The left  ventricle has hyperdynamic function. The left ventricle has no  regional  wall motion abnormalities. There is severe left ventricular hypertrophy.   Left ventricular diastolic  parameters are consistent with Grade I diastolic dysfunction (impaired  relaxation).   2. Right ventricular systolic function is normal. The right ventricular  size is normal.   3. Left atrial size was severely dilated.   4. Large pericardial effusion. The pericardial effusion is  circumferential. There is no evidence of cardiac tamponade.   5. The mitral valve is normal in structure. No evidence of mitral valve  regurgitation. No evidence of mitral stenosis. Severe mitral annular  calcification.   6. The aortic valve is normal in structure. There is severe calcifcation  of the aortic valve. There is moderate thickening of the aortic valve.  Aortic valve regurgitation is not visualized. Mild aortic valve stenosis.  Aortic valve mean gradient measures   14.8 mmHg. Aortic valve Vmax measures 2.59 m/s.   7. The inferior vena cava is normal in size with greater than 50%  respiratory variability, suggesting right atrial pressure of 3 mmHg.   Comparison(s): Large pericardial effusion noted on prior ECHO, no major  change.   Conclusion(s)/Recommendation(s): Findings consistent with hypertrophic  cardiomyopathy.   FINDINGS   Left Ventricle: Left ventricular ejection fraction, by estimation, is  >75%. The left ventricle has hyperdynamic function. The left ventricle has  no regional wall motion abnormalities. The left ventricular internal  cavity size was normal in size. There  is severe left ventricular hypertrophy. Left ventricular diastolic  parameters are consistent with Grade I diastolic dysfunction (impaired  relaxation).   Right Ventricle: The right ventricular size is normal. No increase in  right ventricular wall thickness. Right ventricular systolic function is  normal.   Left Atrium: Left atrial size was severely dilated.   Right Atrium: Right atrial size was normal in size.   Pericardium: A large pericardial effusion is present. The  pericardial  effusion is circumferential. There is no evidence of cardiac tamponade.   Mitral Valve: The mitral valve is normal in structure. Severe mitral  annular calcification. No evidence of mitral valve regurgitation. No  evidence of mitral valve stenosis.   Tricuspid Valve: The tricuspid valve is normal in structure. Tricuspid  valve regurgitation is mild . No evidence of tricuspid stenosis.   Aortic Valve: The aortic valve is normal in structure. There is severe  calcifcation of the aortic valve. There is moderate thickening of the  aortic valve. Aortic valve regurgitation is not visualized. Mild aortic  stenosis is present. Aortic valve mean  gradient measures 14.8 mmHg. Aortic valve peak gradient measures 26.9  mmHg. Aortic valve area, by VTI measures 1.12 cm.   Pulmonic Valve: The pulmonic valve was normal in structure. Pulmonic valve  regurgitation is not visualized. No evidence of pulmonic stenosis.   Aorta: The aortic root is normal in size and structure.   Venous: The inferior vena cava is normal in size with greater than 50%  respiratory variability, suggesting right atrial pressure of 3 mmHg.   IAS/Shunts: No atrial level shunt detected by color flow Doppler.   Echocardiogram 08/31/2023 IMPRESSIONS     1. Left ventricular ejection fraction, by estimation, is 60 to 65%. The  left ventricle has normal function. The left ventricle has no regional  wall motion abnormalities.   2. Right ventricular systolic function is normal. The right ventricular  size is normal.   3. Left atrial size was mildly dilated.   4. Severe mitral annular calcification. Visually there is mitral stenosis  but  doppler interrogation was not done.   5. The aortic valve is tricuspid. There is severe calcifcation of the  aortic valve. Visually there is aortic stenosis but doppler interrogation  was not done.   6. The inferior vena cava is normal in size with <50% respiratory  variability,  suggesting right atrial pressure of 8 mmHg.   7. There was a large circumferential pericardial effusion. There was no  RV diastolic collapse noted. Mitral E inflow velocity showed < 25%  respirophasic variation. The IVC was not significantly dilated. There does  not appear to be tamponade physiology.   8. Limited echo.   FINDINGS   Left Ventricle: Left ventricular ejection fraction, by estimation, is 60  to 65%. The left ventricle has normal function. The left ventricle has no  regional wall motion abnormalities. The left ventricular internal cavity  size was normal in size.   Right Ventricle: The right ventricular size is normal. No increase in  right ventricular wall thickness. Right ventricular systolic function is  normal.   Left Atrium: Left atrial size was mildly dilated.   Right Atrium: Right atrial size was normal in size.   Pericardium: There was a large circumferential pericardial effusion. There  was no RV diastolic collapse noted. Mitral E inflow velocity showed < 25%  respirophasic variation. The IVC was not significantly dilated. There does  not appear to be tamponade  physiology.   Mitral Valve: There is moderate calcification of the mitral valve  leaflet(s). Severe mitral annular calcification.   Aortic Valve: The aortic valve is tricuspid. There is severe calcifcation  of the aortic valve.   Venous: The inferior vena cava is normal in size with less than 50%  respiratory variability, suggesting right atrial pressure of 8 mmHg.   Additional Comments: Spectral Doppler performed. Color Doppler performed.    IVC  IVC diam: 1.50 cm   Dalton McleanMD  Electronically signed by Ezra Kanner  Signature Date/Time: 08/31/2023/4:29:29 PM      Echocardiogram 12/03/2023 IMPRESSIONS     1. Left ventricular ejection fraction, by estimation, is 70 to 75%. The  left ventricle has hyperdynamic function. The left ventricle has no  regional wall motion abnormalities.  There is severe concentric left  ventricular hypertrophy. Left ventricular  diastolic parameters are consistent with Grade I diastolic dysfunction  (impaired relaxation). Elevated left ventricular end-diastolic pressure.   2. Right ventricular systolic function is normal. The right ventricular  size is normal.   3. There effusion measures 5.06cm at largest diameter posteriorly and  2.29cm anteriorly.. Large pericardial effusion. The pericardial effusion  is circumferential.   4. The mitral valve is normal in structure. Mild mitral valve  regurgitation. Mild to moderate mitral stenosis. The mean mitral valve  gradient is 6.0 mmHg.   5. Tricuspid valve regurgitation is moderate.   6. The aortic valve is tricuspid. There is moderate calcification of the  aortic valve. There is moderate thickening of the aortic valve. Aortic  valve regurgitation is not visualized. Moderate aortic valve stenosis.  Aortic valve area, by VTI measures  1.17 cm. Aortic valve mean gradient measures 21.0 mmHg. Aortic valve Vmax  measures 3.14 m/s.   7. Compared to study dated 01/15/23 the pericardial effusion has increased  in diameter posteriorly (4cm prior andnow 5cm) and there is now a 40mm  respirophasic change in MV inflow velocity (2mm on prior echo). There is  no overt diastolic RV collapse, but   both the RV and LV are very underfilled. There  is also now moderate AS.   8. Case discussed with Dr. Pietro who recommended patient come in to  office 2/14 for DOD visit if asymptomatic today.   FINDINGS   Left Ventricle: Left ventricular ejection fraction, by estimation, is 70  to 75%. The left ventricle has hyperdynamic function. The left ventricle  has no regional wall motion abnormalities. Strain imaging was not  performed. The left ventricular internal  cavity size was normal in size. There is severe concentric left  ventricular hypertrophy. Left ventricular diastolic parameters are  consistent with  Grade I diastolic dysfunction (impaired relaxation).  Elevated left ventricular end-diastolic pressure.   Right Ventricle: The right ventricular size is normal. No increase in  right ventricular wall thickness. Right ventricular systolic function is  normal.   Left Atrium: Left atrial size was normal in size.   Right Atrium: Right atrial size was normal in size.   Pericardium: There effusion measures 5.06cm at largest diameter  posteriorly and 2.29cm anteriorly. A large pericardial effusion is  present. The pericardial effusion is circumferential. There is diastolic  collapse of the right atrial wall, diastolic  collapse of the right ventricular free wall and excessive respiratory  variation in the mitral valve spectral Doppler velocities.   Mitral Valve: The mitral valve is normal in structure. Mild mitral valve  regurgitation. Mild to moderate mitral valve stenosis. MV peak gradient,  12.1 mmHg. The mean mitral valve gradient is 6.0 mmHg.   Tricuspid Valve: The tricuspid valve is normal in structure. Tricuspid  valve regurgitation is moderate . No evidence of tricuspid stenosis.   Aortic Valve: The aortic valve is tricuspid. There is moderate  calcification of the aortic valve. There is moderate thickening of the  aortic valve. Aortic valve regurgitation is not visualized. Moderate  aortic stenosis is present. Aortic valve mean  gradient measures 21.0 mmHg. Aortic valve peak gradient measures 39.4  mmHg. Aortic valve area, by VTI measures 1.17 cm.   Pulmonic Valve: The pulmonic valve was normal in structure. Pulmonic valve  regurgitation is mild. No evidence of pulmonic stenosis.   Aorta: The aortic root is normal in size and structure.   Venous: The inferior vena cava was not well visualized.   IAS/Shunts: No atrial level shunt detected by color flow Doppler.     Cardiac catheterization 04/25/2022  1.  Ectatic but patent coronary arteries with no obstructive disease.   Right dominant coronary artery. 2.  Mild pulmonary hypertension with PA pressure 52/15, mean 28 mmHg, transpulmonary gradient 10 mmHg, PVR approximately 2 Wood units. 3.  Moderate aortic stenosis with mean transvalvular gradient 16 mmHg calculated aortic valve area 1.16 cm 4.  Heavy mitral annular calcification by plain fluoroscopy 5.  Mild aortic valve calcification by plain fluoroscopy   Recommend: Continued medical therapy, clinical follow-up, echo surveillance.  The patient does not appear to have severe aortic stenosis and I do not think she would benefit from TAVR at this time.   Diagnostic Dominance: Right  Intervention   Assessment & Plan   1.  Chronic diastolic CHF- Wt today 97.6 lbs. euvolemic.  Breathing stable.  Recently admitted in the setting of acute on chronic respiratory failure secondary to COVID-19 infection.  She received IV diuresis and antiviral medication.  Previously, she did not tolerate Jardiance  medication.   Continue  spironolactone , furosemide  Heart healthy low-sodium diet-reviewed Continue daily weights and weight log  Aortic stenosis-on 5 L of oxygen  via nasal cannula.  4/6 systolic murmur heard along right sternal  border.  Not a candidate for surgery. Continue furosemide , spironolactone   Essential hypertension-BP today 118/78 Maintain blood pressure log Continue spironolactone , furosemide , diltiazem  Heart healthy low-sodium diet  Pulmonary hypertension-on 3-4L nasal cannula.  Breathing significantly improved.   Continue current inhalers, nebulizers  Pericardial effusion-this is chronic.  Previously noted to be stable.  Large pleural effusion noted.  Denies chest pain and pressure.  Heart rate today 86. Continue current medical therapy  CKD stage III-creatinine 1.78 on 06/28/24.    Avoid nephrotoxic agents.   Repeat BMP Follows with PCP/nephrology   Disposition: Follow-up with Dr. Pietro or me in 3-4 months.     Josefa HERO. Eshawn Coor NP-C      08/05/2024, 2:28 PM [facility] [address] Office [phone] Fax [phone]    I spent 13 minutes examining this patient, reviewing medications, and using patient centered shared decision making involving her cardiac care.   I spent greater than 20 minutes reviewing her past medical history,  medications, and prior cardiac tests.

## 2024-08-05 ENCOUNTER — Encounter: Payer: Self-pay | Admitting: General Practice

## 2024-08-05 ENCOUNTER — Ambulatory Visit: Attending: General Practice | Admitting: General Practice

## 2024-08-05 ENCOUNTER — Other Ambulatory Visit: Payer: Self-pay | Admitting: Internal Medicine

## 2024-08-05 VITALS — BP 118/78 | HR 86 | Ht 60.0 in | Wt 97.6 lb

## 2024-08-05 DIAGNOSIS — N1832 Chronic kidney disease, stage 3b: Secondary | ICD-10-CM

## 2024-08-05 DIAGNOSIS — I3139 Other pericardial effusion (noninflammatory): Secondary | ICD-10-CM

## 2024-08-05 DIAGNOSIS — I350 Nonrheumatic aortic (valve) stenosis: Secondary | ICD-10-CM

## 2024-08-05 DIAGNOSIS — I10 Essential (primary) hypertension: Secondary | ICD-10-CM

## 2024-08-05 DIAGNOSIS — I5032 Chronic diastolic (congestive) heart failure: Secondary | ICD-10-CM | POA: Diagnosis not present

## 2024-08-05 DIAGNOSIS — I272 Pulmonary hypertension, unspecified: Secondary | ICD-10-CM

## 2024-08-05 NOTE — Patient Instructions (Signed)
 Medication Instructions:  Your physician recommends that you continue on your current medications as directed. Please refer to the Current Medication list given to you today. *If you need a refill on your cardiac medications before your next appointment, please call your pharmacy*  Lab Work: None ordered If you have labs (blood work) drawn today and your tests are completely normal, you will receive your results only by: MyChart Message (if you have MyChart) OR A paper copy in the mail If you have any lab test that is abnormal or we need to change your treatment, we will call you to review the results.  Testing/Procedures: None ordered  Follow-Up: At [facility], you and your health needs are our priority.  As part of our continuing mission to provide you with exceptional heart care, our providers are all part of one team.  This team includes your primary Cardiologist (physician) and Advanced Practice Providers or APPs (Physician Assistants and Nurse Practitioners) who all work together to provide you with the care you need, when you need it.  Your next appointment:   4-6 month(s)  Provider:   Redell Shallow, MD or APP  We recommend signing up for the patient portal called MyChart.  Sign up information is provided on this After Visit Summary.  MyChart is used to connect with patients for Virtual Visits (Telemedicine).  Patients are able to view lab/test results, encounter notes, upcoming appointments, etc.  Non-urgent messages can be sent to your provider as well.   To learn more about what you can do with MyChart, go to [email or web address].   Other Instructions

## 2024-08-08 ENCOUNTER — Ambulatory Visit (INDEPENDENT_AMBULATORY_CARE_PROVIDER_SITE_OTHER)

## 2024-08-08 DIAGNOSIS — Z23 Encounter for immunization: Secondary | ICD-10-CM

## 2024-08-08 NOTE — Progress Notes (Signed)
 Pt was given High dose flu vaccine with no complications.

## 2024-08-09 ENCOUNTER — Telehealth: Payer: Self-pay

## 2024-08-09 NOTE — Telephone Encounter (Signed)
 Copied from CRM #[id number]. Topic: Clinical - Prescription Issue >> Aug 09, 2024 12:10 PM Jasmin G wrote: Reason for CRM: Pt requested a call back at [phone] to discuss why her Montelukast  Sodium 10 MG prescription refill was denied.

## 2024-08-10 MED ORDER — MONTELUKAST SODIUM 10 MG PO TABS: 10.0000 mg | ORAL_TABLET | Freq: Every day | ORAL | 3 refills | Status: DC

## 2024-08-10 NOTE — Telephone Encounter (Signed)
 Sent in, the hospital took it off her list which is why it was denied.

## 2024-08-10 NOTE — Addendum Note (Signed)
 Addended by: ROLLENE NORRIS A on: 08/10/2024 11:05 AM   Modules accepted: Orders

## 2024-08-10 NOTE — Telephone Encounter (Signed)
 I do not see this on patient medication list please advise

## 2024-08-10 NOTE — Telephone Encounter (Signed)
 Pt is aware that rx has been sent in to pharmacy.

## 2024-08-11 ENCOUNTER — Encounter (HOSPITAL_BASED_OUTPATIENT_CLINIC_OR_DEPARTMENT_OTHER): Admitting: Internal Medicine

## 2024-08-11 DIAGNOSIS — L89100 Pressure ulcer of unspecified part of back, unstageable: Secondary | ICD-10-CM | POA: Diagnosis not present

## 2024-08-13 ENCOUNTER — Other Ambulatory Visit: Payer: Self-pay | Admitting: Pulmonary Disease

## 2024-08-14 ENCOUNTER — Other Ambulatory Visit: Payer: Self-pay | Admitting: Internal Medicine

## 2024-08-16 ENCOUNTER — Inpatient Hospital Stay

## 2024-08-18 DIAGNOSIS — J9620 Acute and chronic respiratory failure, unspecified whether with hypoxia or hypercapnia: Secondary | ICD-10-CM | POA: Diagnosis not present

## 2024-08-19 ENCOUNTER — Other Ambulatory Visit: Payer: Self-pay

## 2024-08-19 ENCOUNTER — Inpatient Hospital Stay

## 2024-08-19 VITALS — BP 107/59 | HR 80 | Temp 98.1°F | Resp 16

## 2024-08-19 DIAGNOSIS — D649 Anemia, unspecified: Secondary | ICD-10-CM

## 2024-08-19 DIAGNOSIS — N1832 Chronic kidney disease, stage 3b: Secondary | ICD-10-CM | POA: Diagnosis not present

## 2024-08-19 DIAGNOSIS — E538 Deficiency of other specified B group vitamins: Secondary | ICD-10-CM

## 2024-08-19 DIAGNOSIS — D631 Anemia in chronic kidney disease: Secondary | ICD-10-CM | POA: Diagnosis not present

## 2024-08-19 LAB — CBC WITH DIFFERENTIAL (CANCER CENTER ONLY)
Abs Immature Granulocytes: 0.04 K/uL (ref 0.00–0.07)
Basophils Absolute: 0 K/uL (ref 0.0–0.1)
Basophils Relative: 1 %
Eosinophils Absolute: 0.1 K/uL (ref 0.0–0.5)
Eosinophils Relative: 2 %
HCT: 28.8 % — ABNORMAL LOW (ref 36.0–46.0)
Hemoglobin: 9.2 g/dL — ABNORMAL LOW (ref 12.0–15.0)
Immature Granulocytes: 1 %
Lymphocytes Relative: 12 %
Lymphs Abs: 0.7 K/uL (ref 0.7–4.0)
MCH: 35.1 pg — ABNORMAL HIGH (ref 26.0–34.0)
MCHC: 31.9 g/dL (ref 30.0–36.0)
MCV: 109.9 fL — ABNORMAL HIGH (ref 80.0–100.0)
Monocytes Absolute: 0.4 K/uL (ref 0.1–1.0)
Monocytes Relative: 6 %
Neutro Abs: 5 K/uL (ref 1.7–7.7)
Neutrophils Relative %: 78 %
Platelet Count: 204 K/uL (ref 150–400)
RBC: 2.62 MIL/uL — ABNORMAL LOW (ref 3.87–5.11)
RDW: 13.2 % (ref 11.5–15.5)
WBC Count: 6.3 K/uL (ref 4.0–10.5)
nRBC: 0 % (ref 0.0–0.2)

## 2024-08-19 LAB — CMP (CANCER CENTER ONLY)
ALT: 10 U/L (ref 0–44)
AST: 21 U/L (ref 15–41)
Albumin: 4 g/dL (ref 3.5–5.0)
Alkaline Phosphatase: 82 U/L (ref 38–126)
Anion gap: 7 (ref 5–15)
BUN: 56 mg/dL — ABNORMAL HIGH (ref 8–23)
CO2: 35 mmol/L — ABNORMAL HIGH (ref 22–32)
Calcium: 9.2 mg/dL (ref 8.9–10.3)
Chloride: 93 mmol/L — ABNORMAL LOW (ref 98–111)
Creatinine: 1.52 mg/dL — ABNORMAL HIGH (ref 0.44–1.00)
GFR, Estimated: 33 mL/min — ABNORMAL LOW (ref >60)
Glucose, Bld: 120 mg/dL — ABNORMAL HIGH (ref 70–99)
Potassium: 4.3 mmol/L (ref 3.5–5.1)
Sodium: 135 mmol/L (ref 135–145)
Total Bilirubin: 0.5 mg/dL (ref 0.0–1.2)
Total Protein: 6.8 g/dL (ref 6.5–8.1)

## 2024-08-19 MED ORDER — CYANOCOBALAMIN 1000 MCG/ML IJ SOLN
1000.0000 ug | Freq: Once | INTRAMUSCULAR | Status: AC
  Administered 2024-08-19: 1000 ug via INTRAMUSCULAR
  Filled 2024-08-19: qty 1

## 2024-08-19 MED ORDER — EPOETIN ALFA-EPBX 10000 UNIT/ML IJ SOLN
10000.0000 [IU] | Freq: Once | INTRAMUSCULAR | Status: AC
  Administered 2024-08-19: 10000 [IU] via SUBCUTANEOUS
  Filled 2024-08-19: qty 1

## 2024-08-25 ENCOUNTER — Encounter (HOSPITAL_BASED_OUTPATIENT_CLINIC_OR_DEPARTMENT_OTHER): Admitting: Internal Medicine

## 2024-08-26 DIAGNOSIS — I130 Hypertensive heart and chronic kidney disease with heart failure and stage 1 through stage 4 chronic kidney disease, or unspecified chronic kidney disease: Secondary | ICD-10-CM | POA: Diagnosis not present

## 2024-08-26 DIAGNOSIS — E46 Unspecified protein-calorie malnutrition: Secondary | ICD-10-CM | POA: Diagnosis not present

## 2024-08-26 DIAGNOSIS — K219 Gastro-esophageal reflux disease without esophagitis: Secondary | ICD-10-CM | POA: Diagnosis not present

## 2024-08-26 DIAGNOSIS — J309 Allergic rhinitis, unspecified: Secondary | ICD-10-CM | POA: Diagnosis not present

## 2024-08-26 DIAGNOSIS — N184 Chronic kidney disease, stage 4 (severe): Secondary | ICD-10-CM | POA: Diagnosis not present

## 2024-08-26 DIAGNOSIS — J8410 Pulmonary fibrosis, unspecified: Secondary | ICD-10-CM | POA: Diagnosis not present

## 2024-08-26 DIAGNOSIS — I509 Heart failure, unspecified: Secondary | ICD-10-CM | POA: Diagnosis not present

## 2024-08-26 DIAGNOSIS — J4489 Other specified chronic obstructive pulmonary disease: Secondary | ICD-10-CM | POA: Diagnosis not present

## 2024-08-26 DIAGNOSIS — L89102 Pressure ulcer of unspecified part of back, stage 2: Secondary | ICD-10-CM | POA: Diagnosis not present

## 2024-08-29 ENCOUNTER — Ambulatory Visit: Admitting: Internal Medicine

## 2024-08-29 ENCOUNTER — Encounter (HOSPITAL_BASED_OUTPATIENT_CLINIC_OR_DEPARTMENT_OTHER): Attending: Internal Medicine | Admitting: Internal Medicine

## 2024-08-29 DIAGNOSIS — L89100 Pressure ulcer of unspecified part of back, unstageable: Secondary | ICD-10-CM

## 2024-08-29 DIAGNOSIS — M349 Systemic sclerosis, unspecified: Secondary | ICD-10-CM | POA: Diagnosis not present

## 2024-08-29 DIAGNOSIS — J849 Interstitial pulmonary disease, unspecified: Secondary | ICD-10-CM | POA: Diagnosis not present

## 2024-08-29 DIAGNOSIS — Z9981 Dependence on supplemental oxygen: Secondary | ICD-10-CM | POA: Diagnosis not present

## 2024-09-01 ENCOUNTER — Other Ambulatory Visit: Payer: Self-pay

## 2024-09-01 DIAGNOSIS — I509 Heart failure, unspecified: Secondary | ICD-10-CM | POA: Diagnosis not present

## 2024-09-01 NOTE — Patient Outreach (Signed)
 Complex Care Management   Visit Note  09/01/2024  Name:  Paula Ward MRN: [id number] DOB: [birth year]  Situation: RNCM called patient for a scheduled telephone follow up call. [name] states she does not have time to talk right now, she is helping a friend. Request RNCM call back another time. Patient states she is not able to reschedule at this time and reports sometime in December would be fine.   Follow Up Plan:   Telephone follow up appointment date/time:  09/20/24 at 2:30 pm  Heddy Shutter, RN, MSN, BSN, CCM [place]  [facility], Population Health Case Manager Phone: [phone]

## 2024-09-01 NOTE — Patient Instructions (Addendum)
 Visit Information  As requested, I have rescheduled our telephone call for December. Please don't hesitate to contact me if I can be of assistance to you before our next scheduled appointment.  Your next care management appointment is by telephone on 09/20/24 at 2:30 pm   Please call the care guide team at [phone] if you need to cancel, schedule, or reschedule an appointment.   Please call the Suicide and Crisis Lifeline: 988 call the [place]  National Suicide Prevention Lifeline: 574-606-8095 or TTY: 940-051-9898 TTY 8017404573) to talk to a trained counselor if you are experiencing a Mental Health or Behavioral Health Crisis or need someone to talk to.   Heddy Shutter, RN, MSN, BSN, CCM [place]  [facility], Population Health Case Manager Phone: [phone]

## 2024-09-07 ENCOUNTER — Telehealth: Payer: Self-pay

## 2024-09-07 NOTE — Telephone Encounter (Signed)
 Paper received from [facility] regarding pt's NIV. This is only requiring a provider signature. I will fax once this has been signed.

## 2024-09-11 ENCOUNTER — Other Ambulatory Visit: Payer: Self-pay | Admitting: Gastroenterology

## 2024-09-12 ENCOUNTER — Other Ambulatory Visit: Payer: Self-pay | Admitting: Physician Assistant

## 2024-09-12 DIAGNOSIS — D649 Anemia, unspecified: Secondary | ICD-10-CM

## 2024-09-12 NOTE — Telephone Encounter (Signed)
 SIGNED AND FAXED. NFN

## 2024-09-13 ENCOUNTER — Inpatient Hospital Stay

## 2024-09-13 ENCOUNTER — Inpatient Hospital Stay: Attending: Physician Assistant | Admitting: Physician Assistant

## 2024-09-13 VITALS — BP 112/54 | HR 88 | Temp 97.5°F | Resp 16 | Wt 98.7 lb

## 2024-09-13 DIAGNOSIS — N1832 Chronic kidney disease, stage 3b: Secondary | ICD-10-CM | POA: Diagnosis not present

## 2024-09-13 DIAGNOSIS — E538 Deficiency of other specified B group vitamins: Secondary | ICD-10-CM | POA: Insufficient documentation

## 2024-09-13 DIAGNOSIS — D649 Anemia, unspecified: Secondary | ICD-10-CM

## 2024-09-13 DIAGNOSIS — D631 Anemia in chronic kidney disease: Secondary | ICD-10-CM | POA: Diagnosis not present

## 2024-09-13 DIAGNOSIS — Z8616 Personal history of COVID-19: Secondary | ICD-10-CM | POA: Insufficient documentation

## 2024-09-13 DIAGNOSIS — D638 Anemia in other chronic diseases classified elsewhere: Secondary | ICD-10-CM | POA: Diagnosis not present

## 2024-09-13 DIAGNOSIS — I509 Heart failure, unspecified: Secondary | ICD-10-CM | POA: Insufficient documentation

## 2024-09-13 LAB — CMP (CANCER CENTER ONLY)
ALT: 9 U/L (ref 0–44)
AST: 29 U/L (ref 15–41)
Albumin: 4.5 g/dL (ref 3.5–5.0)
Alkaline Phosphatase: 105 U/L (ref 38–126)
Anion gap: 11 (ref 5–15)
BUN: 56 mg/dL — ABNORMAL HIGH (ref 8–23)
CO2: 32 mmol/L (ref 22–32)
Calcium: 9.5 mg/dL (ref 8.9–10.3)
Chloride: 96 mmol/L — ABNORMAL LOW (ref 98–111)
Creatinine: 1.63 mg/dL — ABNORMAL HIGH (ref 0.44–1.00)
GFR, Estimated: 30 mL/min — ABNORMAL LOW (ref >60)
Glucose, Bld: 78 mg/dL (ref 70–99)
Potassium: 4.3 mmol/L (ref 3.5–5.1)
Sodium: 139 mmol/L (ref 135–145)
Total Bilirubin: 0.5 mg/dL (ref 0.0–1.2)
Total Protein: 7.5 g/dL (ref 6.5–8.1)

## 2024-09-13 LAB — CBC WITH DIFFERENTIAL (CANCER CENTER ONLY)
Abs Immature Granulocytes: 0.04 K/uL (ref 0.00–0.07)
Basophils Absolute: 0.1 K/uL (ref 0.0–0.1)
Basophils Relative: 1 %
Eosinophils Absolute: 0.2 K/uL (ref 0.0–0.5)
Eosinophils Relative: 2 %
HCT: 32.9 % — ABNORMAL LOW (ref 36.0–46.0)
Hemoglobin: 10.5 g/dL — ABNORMAL LOW (ref 12.0–15.0)
Immature Granulocytes: 1 %
Lymphocytes Relative: 9 %
Lymphs Abs: 0.7 K/uL (ref 0.7–4.0)
MCH: 35.1 pg — ABNORMAL HIGH (ref 26.0–34.0)
MCHC: 31.9 g/dL (ref 30.0–36.0)
MCV: 110 fL — ABNORMAL HIGH (ref 80.0–100.0)
Monocytes Absolute: 0.3 K/uL (ref 0.1–1.0)
Monocytes Relative: 4 %
Neutro Abs: 6 K/uL (ref 1.7–7.7)
Neutrophils Relative %: 83 %
Platelet Count: 205 K/uL (ref 150–400)
RBC: 2.99 MIL/uL — ABNORMAL LOW (ref 3.87–5.11)
RDW: 12.9 % (ref 11.5–15.5)
WBC Count: 7.2 K/uL (ref 4.0–10.5)
nRBC: 0 % (ref 0.0–0.2)

## 2024-09-13 LAB — VITAMIN B12: Vitamin B-12: 558 pg/mL (ref 180–914)

## 2024-09-13 MED ORDER — EPOETIN ALFA-EPBX 10000 UNIT/ML IJ SOLN
10000.0000 [IU] | Freq: Once | INTRAMUSCULAR | Status: AC
  Administered 2024-09-13: 10000 [IU] via SUBCUTANEOUS
  Filled 2024-09-13: qty 1

## 2024-09-13 MED ORDER — CYANOCOBALAMIN 1000 MCG/ML IJ SOLN
1000.0000 ug | Freq: Once | INTRAMUSCULAR | Status: AC
  Administered 2024-09-13: 1000 ug via INTRAMUSCULAR
  Filled 2024-09-13: qty 1

## 2024-09-13 NOTE — Progress Notes (Signed)
 [facility] [phone]) [phone]   [phone]  PROGRESS NOTE  Patient Care Team: Rollene Almarie LABOR, MD as PCP - General (Internal Medicine) Pietro Redell RAMAN, MD as PCP - Cardiology (Cardiology) Prentiss Heddy HERO, RN as [facility] Management Wallace, Juana M, RN  CHIEF COMPLAINTS/PURPOSE OF CONSULTATION:  Macrocytic anemia  Vitamin B12 deficiency  INTERVAL HISTORY: BONNI NEUSER 88 y.o. female returns for a follow up for macrocytic anemia.  She was last seen on 07/15/2024. In the interim, she continues on monthly B12 and retacrit  injections.   On exam today, Ms. Broski reports having ongoing fatigue which interferes with her ADLs. She continues on 3-4 L of supplemental oxygen . She requires a cane or wheelchair for ambulation. Her husband was recently placed in a nursing home which was stressful.   She denies nausea, vomiting or bowel habit changes.  She denies easy bruising or signs of active bleeding.  She denies fevers, chills, night sweats, chest pain or cough.  She has no other complaints.  Rest of the 10 point ROS as below.   MEDICAL HISTORY:  Past Medical History:  Diagnosis Date   Anemia    Angiodysplasia of stomach and duodenum    Aortic stenosis    Arthus phenomenon    AVM (arteriovenous malformation)    Blood transfusion without reported diagnosis    Breast cancer (HCC) 1989   Left   Candida esophagitis (HCC)    Cataract    Chronic respiratory failure (HCC)    CKD stage 3b, GFR 30-44 ml/min (HCC)    Corneal edema    Corneal epithelial basement membrane dystrophy    CREST syndrome (HCC)    GERD (gastroesophageal reflux disease)    w/ HH   Interstitial lung disease (HCC)    Nodule of kidney    Pericardial effusion    PONV (postoperative nausea and vomiting)    Pulmonary embolus (HCC) 2003   Pulmonary hypertension (HCC)    Rectal incontinence    Renal cell carcinoma (HCC)    Scleroderma (HCC)    Tubular adenoma of colon     Uterine polyp     SURGICAL HISTORY: Past Surgical History:  Procedure Laterality Date   APPENDECTOMY  1962   BREAST LUMPECTOMY  1989   left   CATARACT EXTRACTION, BILATERAL Bilateral 12/2013   COLONOSCOPY WITH PROPOFOL  N/A 04/15/2021   Procedure: COLONOSCOPY WITH PROPOFOL ;  Surgeon: Teressa Toribio SQUIBB, MD;  Location: [facility];  Service: Endoscopy;  Laterality: N/A;   ENTEROSCOPY N/A 01/18/2016   Procedure: ENTEROSCOPY;  Surgeon: Gustav Shila GAILS, MD;  Location: [facility];  Service: Endoscopy;  Laterality: N/A;   ENTEROSCOPY N/A 05/24/2018   Procedure: ENTEROSCOPY;  Surgeon: Charlanne Groom, MD;  Location: [facility];  Service: Endoscopy;  Laterality: N/A;   ENTEROSCOPY N/A 03/29/2021   Procedure: ENTEROSCOPY;  Surgeon: Albertus Gordy HERO, MD;  Location: [facility];  Service: Gastroenterology;  Laterality: N/A;   ENTEROSCOPY N/A 04/13/2021   Procedure: ENTEROSCOPY;  Surgeon: Leigh Elspeth SQUIBB, MD;  Location: [facility];  Service: Gastroenterology;  Laterality: N/A;   ESOPHAGOGASTRODUODENOSCOPY (EGD) WITH PROPOFOL  N/A 12/21/2015   Procedure: ESOPHAGOGASTRODUODENOSCOPY (EGD) WITH PROPOFOL ;  Surgeon: Norleen LOISE Kiang, MD;  Location: [facility];  Service: Endoscopy;  Laterality: N/A;   HOT HEMOSTASIS N/A 05/24/2018   Procedure: HOT HEMOSTASIS (ARGON PLASMA COAGULATION/BICAP);  Surgeon: Charlanne Groom, MD;  Location: [facility];  Service: Endoscopy;  Laterality: N/A;   HOT HEMOSTASIS N/A 03/29/2021   Procedure: HOT HEMOSTASIS (ARGON PLASMA  COAGULATION/BICAP);  Surgeon: Albertus Gordy HERO, MD;  Location: [facility];  Service: Gastroenterology;  Laterality: N/A;   HOT HEMOSTASIS N/A 04/13/2021   Procedure: HOT HEMOSTASIS (ARGON PLASMA COAGULATION/BICAP);  Surgeon: Leigh Elspeth SQUIBB, MD;  Location: [facility];  Service: Gastroenterology;  Laterality: N/A;   INTRAMEDULLARY (IM) NAIL INTERTROCHANTERIC Left 10/11/2022   Procedure: INTRAMEDULLARY (IM) NAIL INTERTROCHANTERIC;  Surgeon: Melodi Lerner,  MD;  Location: [facility];  Service: Orthopedics;  Laterality: Left;   IR GENERIC HISTORICAL  06/05/2016   IR RADIOLOGIST EVAL & MGMT 06/05/2016 Marcey Moan, MD [facility] INTERV RAD   IVC Filter     KIDNEY SURGERY     left -laser surgery by Dr. moan- 5 yrs ago no removal   RIGHT/LEFT HEART CATH AND CORONARY ANGIOGRAPHY N/A 04/25/2022   Procedure: RIGHT/LEFT HEART CATH AND CORONARY ANGIOGRAPHY;  Surgeon: Wonda Sharper, MD;  Location: [facility] INVASIVE CV LAB;  Service: Cardiovascular;  Laterality: N/A;   SCHLEROTHERAPY  05/24/2018   Procedure: WALDEMAR;  Surgeon: Charlanne Groom, MD;  Location: [facility];  Service: Endoscopy;;   SUBMUCOSAL TATTOO INJECTION  04/13/2021   Procedure: SUBMUCOSAL TATTOO INJECTION;  Surgeon: Leigh Elspeth SQUIBB, MD;  Location: [facility];  Service: Gastroenterology;;   TONSILLECTOMY     TUBAL LIGATION      SOCIAL HISTORY: Social History   Socioeconomic History   Marital status: Married    Spouse name: Not on file   Number of children: 2   Years of education: Not on file   Highest education level: Not on file  Occupational History   Occupation: Retired  Tobacco Use   Smoking status: Never   Smokeless tobacco: Never  Vaping Use   Vaping status: Never Used  Substance and Sexual Activity   Alcohol use: No   Drug use: No   Sexual activity: Not Currently  Other Topics Concern   Not on file  Social History Narrative   Married '611 son- '65, 1 daughter '63; 6 children (2 adopted)SO- SOBRetirement- doing well. Marriage in good health. Patient has never smoked. Alcohol use- noPt gets regular exercise   Social Drivers of Health   Financial Resource Strain: Low Risk  (04/02/2022)   Overall Financial Resource Strain (CARDIA)    Difficulty of Paying Living Expenses: Not hard at all  Food Insecurity: Patient Declined (06/17/2024)   Hunger Vital Sign    Worried About Running Out of Food in the Last Year: Patient declined    Ran Out of Food in the Last Year:  Patient declined  Transportation Needs: No Transportation Needs (06/17/2024)   PRAPARE - Administrator, Civil Service (Medical): No    Lack of Transportation (Non-Medical): No  Physical Activity: Inactive (04/02/2022)   Exercise Vital Sign    Days of Exercise per Week: 0 days    Minutes of Exercise per Session: 0 min  Stress: No Stress Concern Present (04/02/2022)   [employer] of Occupational Health - Occupational Stress Questionnaire    Feeling of Stress : Not at all  Social Connections: Socially Integrated (06/15/2024)   Social Connection and Isolation Panel    Frequency of Communication with Friends and Family: More than three times a week    Frequency of Social Gatherings with Friends and Family: More than three times a week    Attends Religious Services: More than 4 times per year    Active Member of [employer] or Organizations: Yes    Attends Banker Meetings: More than 4 times per year  Marital Status: Married  Catering Manager Violence: Patient Declined (06/17/2024)   Humiliation, Afraid, Rape, and Kick questionnaire    Fear of Current or Ex-Partner: Patient declined    Emotionally Abused: Patient declined    Physically Abused: Patient declined    Sexually Abused: Patient declined    FAMILY HISTORY: Family History  Problem Relation Age of Onset   Bladder Cancer Father    Diabetes Father    Prostate cancer Father    Alzheimer's disease Mother    Diabetes Sister    Lung cancer Sister         smoker   Esophageal cancer Paternal Uncle    Colon cancer Neg Hx     ALLERGIES:  is allergic to codeine , other, erythromycin, iodinated contrast media, lisinopril, mirtazapine , mycophenolate mofetil, and sulfa  antibiotics.  MEDICATIONS:  Current Outpatient Medications  Medication Sig Dispense Refill   acetaminophen  (TYLENOL ) 325 MG tablet Take 2 tablets (650 mg total) by mouth every 6 (six) hours as needed for moderate pain. 30 tablet 0   albuterol   (PROVENTIL ) (2.5 MG/3ML) 0.083% nebulizer solution Take 3 mLs (2.5 mg total) by nebulization every 6 (six) hours as needed for wheezing or shortness of breath. 75 mL 12   albuterol  (VENTOLIN  HFA) 108 (90 Base) MCG/ACT inhaler INHALE 2 PUFFS INTO THE LUNGS EVERY 6 HOURS AS NEEDED FOR WHEEZING OR SHORTNESS OF BREATH 6.7 g 3   ALPRAZolam  (XANAX ) 0.25 MG tablet Take 1 tablet (0.25 mg total) by mouth at bedtime as needed for anxiety. 30 tablet 1   arformoterol  (BROVANA ) 15 MCG/2ML NEBU USE 1 VIAL IN NEBULIZER TWICE DAILY 120 mL 0   augmented betamethasone dipropionate (DIPROLENE-AF) 0.05 % cream Apply 1 application  topically 2 (two) times daily as needed (for irritation).     clobetasol ointment (TEMOVATE) 0.05 % Apply 1 Application topically 2 (two) times daily as needed (for irritation).     diltiazem  (CARDIZEM ) 30 MG tablet Take 1 tablet (30 mg total) by mouth every 12 (twelve) hours. 60 tablet 11   docusate sodium  (COLACE) 100 MG capsule Take 100 mg by mouth daily.     famotidine  (PEPCID ) 20 MG tablet TAKE 1 TABLET BY MOUTH AT BEDTIME 30 tablet 0   fluticasone  (FLONASE ) 50 MCG/ACT nasal spray USE 1 SPRAY(S) IN EACH NOSTRIL ONCE DAILY AS NEEDED FOR ALLERGIES 16 g 0   furosemide  (LASIX ) 20 MG tablet Take 2 tablets ( 40 mg ) every morning may take extra 2 tablets for weight gain of 3 lbs 360 tablet 3   ipratropium (ATROVENT ) 0.02 % nebulizer solution Take 2.5 mLs (0.5 mg total) by nebulization 2 (two) times daily. 450 mL 3   latanoprost  (XALATAN ) 0.005 % ophthalmic solution Place 1 drop into both eyes at bedtime.     montelukast  (SINGULAIR ) 10 MG tablet Take 1 tablet (10 mg total) by mouth at bedtime. 90 tablet 3   Multiple Vitamin (MULTIVITAMIN WITH MINERALS) TABS tablet Take 1 tablet by mouth daily.     Nutritional Supplements (ENSURE HIGH PROTEIN) LIQD Take 0.5 Bottles by mouth daily as needed (for supplementation).     OXYGEN  Inhale 6 L/min into the lungs continuous.     pantoprazole  (PROTONIX ) 40  MG tablet TAKE 1 TABLET BY MOUTH TWICE DAILY BEFORE A MEAL 180 tablet 0   spironolactone  (ALDACTONE ) 25 MG tablet Take 1/2 tablet ( 12.5 mg ) every morning     sucralfate  (CARAFATE ) 1 GM/10ML suspension TAKE 10 MLS BY MOUTH EVERY 6 HOURS AS NEEDED  FOR REFLUX 420 mL 0   triamcinolone  (KENALOG ) 0.1 % paste Use as directed 1 Application in the mouth or throat 2 (two) times daily. 5 g 12   triamcinolone  cream (KENALOG ) 0.1 % Apply 1 Application topically See admin instructions. Apply to affected leg(s) after showers and up to 2 additional times a day as needed for irritation     No current facility-administered medications for this visit.   Facility-Administered Medications Ordered in Other Visits  Medication Dose Route Frequency Provider Last Rate Last Admin   cyanocobalamin  (VITAMIN B12) injection 1,000 mcg  1,000 mcg Intramuscular Once Rigoberto Repass T, PA-C       cyanocobalamin  (VITAMIN B12) injection 1,000 mcg  1,000 mcg Intramuscular Once Ronique Simerly T, PA-C       epoetin  alfa-epbx (RETACRIT ) injection 10,000 Units  10,000 Units Subcutaneous Once Federico Norleen ONEIDA MADISON, MD        REVIEW OF SYSTEMS:   Constitutional: ( - ) fevers, ( - )  chills , ( - ) night sweats Eyes: ( - ) blurriness of vision, ( - ) double vision, ( - ) watery eyes Ears, nose, mouth, throat, and face: ( - ) mucositis, ( - ) sore throat Respiratory: ( - ) cough, ( + ) dyspnea, ( - ) wheezes Cardiovascular: ( - ) palpitation, ( - ) chest discomfort, ( - ) lower extremity swelling Gastrointestinal:  ( - ) nausea, ( - ) heartburn, ( - ) change in bowel habits Skin: ( - ) abnormal skin rashes Lymphatics: ( - ) new lymphadenopathy, ( - ) easy bruising Neurological: ( - ) numbness, ( - ) tingling, ( - ) new weaknesses Behavioral/Psych: ( - ) mood change, ( - ) new changes  All other systems were reviewed with the patient and are negative.  PHYSICAL EXAMINATION: ECOG PERFORMANCE STATUS: 2 - Symptomatic, <50% confined to  bed  Vitals:   09/13/24 1124  BP: (!) 112/54  Pulse: 88  Resp: 16  Temp: (!) 97.5 F (36.4 C)  SpO2: 96%      Filed Weights   09/13/24 1124  Weight: 98 lb 11.2 oz (44.8 kg)    GENERAL:  elderly female in NAD  SKIN: skin color, texture, turgor are normal, no rashes or significant lesions EYES: conjunctiva are pink and non-injected, sclera clear LUNGS: clear to auscultation and percussion with normal breathing effort HEART: regular rate & rhythm and no murmurs and no lower extremity edema Musculoskeletal: no cyanosis of digits and no clubbing  PSYCH: alert & oriented x 3, fluent speech NEURO: no focal motor/sensory deficits  LABORATORY DATA:  I have reviewed the data as listed    Latest Ref Rng & Units 09/13/2024   11:03 AM 08/19/2024    2:11 PM 07/28/2024    1:47 PM  CBC  WBC 4.0 - 10.5 K/uL 7.2  6.3  6.6   Hemoglobin 12.0 - 15.0 g/dL 89.4  9.2  9.3   Hematocrit 36.0 - 46.0 % 32.9  28.8  29.6   Platelets 150 - 400 K/uL 205  204  204        Latest Ref Rng & Units 09/13/2024   11:03 AM 08/19/2024    2:11 PM 07/28/2024    1:47 PM  CMP  Glucose 70 - 99 mg/dL 78  879  894   BUN 8 - 23 mg/dL 56  56  63   Creatinine 0.44 - 1.00 mg/dL 8.36  8.47  8.43   Sodium 135 -  145 mmol/L 139  135  139   Potassium 3.5 - 5.1 mmol/L 4.3  4.3  4.1   Chloride 98 - 111 mmol/L 96  93  96   CO2 22 - 32 mmol/L 32  35  37   Calcium  8.9 - 10.3 mg/dL 9.5  9.2  9.2   Total Protein 6.5 - 8.1 g/dL 7.5  6.8  6.7   Total Bilirubin 0.0 - 1.2 mg/dL 0.5  0.5  0.6   Alkaline Phos 38 - 126 U/L 105  82  74   AST 15 - 41 U/L 29  21  21    ALT 0 - 44 U/L 9  10  9     RADIOGRAPHIC STUDIES: No results found.    ASSESSMENT & PLAN DENETTA FEI is a 88 y.o. female who presents to the hematology clinic for evaluation of macrocytic anemia  #Macrocytic anemia: #Vitamin B12 deficiency: #Anemia of chronic disease --Suspect multifactorial including vitamin b12 deficinecy, CKD and chronic disease.   --Iron  panel from 12/30/2022 reviewed without deficiency.  --Patient had retacrit  10,000 units q 4 weeks, last received 12/30/2022. Will administer again today.  --Workup from 02/12/2023 didn't reveal hemolysis, paraproteinemia, folate deficiency. --bone marrow biopsy on 08/14/2023 showed no clear cause for the macrocytosis, though excessive iron  deposition was noted. HFE genetic testing was negative in November 2024 -- no evidence of cirrhosis on prior imaging from 2023 (Chest imaging, but liver visualized). --Labs from today show WBC 7.2, Hgb 10.5, MCV 110.0, Plt 205. Creatinine stable at 1.63, LFTs normal. B12 and MMA levels pending --recommend to continue with monthly retacrit  and B12 injection.   --RTC q 4 weeks for labs/injection and 8 weeks for labs/visit/injection  No orders of the defined types were placed in this encounter.   All questions were answered. The patient knows to call the clinic with any problems, questions or concerns.  I have spent a total of 25 minutes minutes of face-to-face and non-face-to-face time, preparing to see the patient, performing a medically appropriate examination, counseling and educating the patient,documenting clinical information in the electronic health record, and care coordination.   Johnston Police PA-C Dept of Hematology and Oncology [facility] Phone: [phone]

## 2024-09-13 NOTE — Progress Notes (Signed)
 [facility] [phone]   [phone]  PROGRESS NOTE  Patient Care Team: Rollene Almarie LABOR, MD as PCP - General (Internal Medicine) Pietro Redell RAMAN, MD as PCP - Cardiology (Cardiology) Prentiss Heddy HERO, RN as [facility] Management Wallace, Juana M, RN  CHIEF COMPLAINTS/PURPOSE OF CONSULTATION:  Macrocytic anemia  Vitamin B12 deficiency  INTERVAL HISTORY: Paula Ward 88 y.o. female returns for a follow up for macrocytic anemia.  She was last seen by Dr. Federico on 05/19/2024. In the interim, she was admitted from 06/14/24-06/16/24 for acute on chronic heart failure and COVID.   On exam today, Ms. Voong reports she is slowly recovering from her recent hospitalization with COVID infection.  She requires 3 to 4 L of supplemental oxygen .  She still struggles with some balance issues, requiring a cane or wheelchair to assist with ambulation.  She denies nausea, vomiting or bowel habit changes.  She denies easy bruising or signs of active bleeding.  She denies fevers, chills, night sweats, chest pain or cough.  She has no other complaints.  Rest of the 10 point ROS as below.   MEDICAL HISTORY:  Past Medical History:  Diagnosis Date   Anemia    Angiodysplasia of stomach and duodenum    Aortic stenosis    Arthus phenomenon    AVM (arteriovenous malformation)    Blood transfusion without reported diagnosis    Breast cancer (HCC) 1989   Left   Candida esophagitis (HCC)    Cataract    Chronic respiratory failure (HCC)    CKD stage 3b, GFR 30-44 ml/min (HCC)    Corneal edema    Corneal epithelial basement membrane dystrophy    CREST syndrome (HCC)    GERD (gastroesophageal reflux disease)    w/ HH   Interstitial lung disease (HCC)    Nodule of kidney    Pericardial effusion    PONV (postoperative nausea and vomiting)    Pulmonary embolus (HCC) 2003   Pulmonary hypertension (HCC)    Rectal incontinence    Renal cell carcinoma (HCC)     Scleroderma (HCC)    Tubular adenoma of colon    Uterine polyp     SURGICAL HISTORY: Past Surgical History:  Procedure Laterality Date   APPENDECTOMY  1962   BREAST LUMPECTOMY  1989   left   CATARACT EXTRACTION, BILATERAL Bilateral 12/2013   COLONOSCOPY WITH PROPOFOL  N/A 04/15/2021   Procedure: COLONOSCOPY WITH PROPOFOL ;  Surgeon: Teressa Toribio SQUIBB, MD;  Location: [facility];  Service: Endoscopy;  Laterality: N/A;   ENTEROSCOPY N/A 01/18/2016   Procedure: ENTEROSCOPY;  Surgeon: Gustav Shila GAILS, MD;  Location: [facility];  Service: Endoscopy;  Laterality: N/A;   ENTEROSCOPY N/A 05/24/2018   Procedure: ENTEROSCOPY;  Surgeon: Charlanne Groom, MD;  Location: [facility];  Service: Endoscopy;  Laterality: N/A;   ENTEROSCOPY N/A 03/29/2021   Procedure: ENTEROSCOPY;  Surgeon: Albertus Gordy HERO, MD;  Location: [facility];  Service: Gastroenterology;  Laterality: N/A;   ENTEROSCOPY N/A 04/13/2021   Procedure: ENTEROSCOPY;  Surgeon: Leigh Elspeth SQUIBB, MD;  Location: [facility];  Service: Gastroenterology;  Laterality: N/A;   ESOPHAGOGASTRODUODENOSCOPY (EGD) WITH PROPOFOL  N/A 12/21/2015   Procedure: ESOPHAGOGASTRODUODENOSCOPY (EGD) WITH PROPOFOL ;  Surgeon: Norleen LOISE Kiang, MD;  Location: [facility];  Service: Endoscopy;  Laterality: N/A;   HOT HEMOSTASIS N/A 05/24/2018   Procedure: HOT HEMOSTASIS (ARGON PLASMA COAGULATION/BICAP);  Surgeon: Charlanne Groom, MD;  Location: [facility];  Service: Endoscopy;  Laterality: N/A;   HOT HEMOSTASIS  N/A 03/29/2021   Procedure: HOT HEMOSTASIS (ARGON PLASMA COAGULATION/BICAP);  Surgeon: Albertus Gordy HERO, MD;  Location: [facility];  Service: Gastroenterology;  Laterality: N/A;   HOT HEMOSTASIS N/A 04/13/2021   Procedure: HOT HEMOSTASIS (ARGON PLASMA COAGULATION/BICAP);  Surgeon: Leigh Elspeth SQUIBB, MD;  Location: [facility];  Service: Gastroenterology;  Laterality: N/A;   INTRAMEDULLARY (IM) NAIL INTERTROCHANTERIC Left 10/11/2022   Procedure: INTRAMEDULLARY (IM)  NAIL INTERTROCHANTERIC;  Surgeon: Melodi Lerner, MD;  Location: [facility];  Service: Orthopedics;  Laterality: Left;   IR GENERIC HISTORICAL  06/05/2016   IR RADIOLOGIST EVAL & MGMT 06/05/2016 Marcey Moan, MD [facility] INTERV RAD   IVC Filter     KIDNEY SURGERY     left -laser surgery by Dr. moan- 5 yrs ago no removal   RIGHT/LEFT HEART CATH AND CORONARY ANGIOGRAPHY N/A 04/25/2022   Procedure: RIGHT/LEFT HEART CATH AND CORONARY ANGIOGRAPHY;  Surgeon: Wonda Sharper, MD;  [facility] CV [facility];  Service: Cardiovascular;  Laterality: N/A;   SCHLEROTHERAPY  05/24/2018   Procedure: WALDEMAR;  Surgeon: Charlanne Groom, MD;  Location: [facility];  Service: Endoscopy;;   SUBMUCOSAL TATTOO INJECTION  04/13/2021   Procedure: SUBMUCOSAL TATTOO INJECTION;  Surgeon: Leigh Elspeth SQUIBB, MD;  Location: [facility];  Service: Gastroenterology;;   TONSILLECTOMY     TUBAL LIGATION      SOCIAL HISTORY: Social History   Socioeconomic History   Marital status: Married    Spouse name: Not on file   Number of children: 2   Years of education: Not on file   Highest education level: Not on file  Occupational History   Occupation: Retired  Tobacco Use   Smoking status: Never   Smokeless tobacco: Never  Vaping Use   Vaping status: Never Used  Substance and Sexual Activity   Alcohol use: No   Drug use: No   Sexual activity: Not Currently  Other Topics Concern   Not on file  Social History Narrative   Married '611 son- '65, 1 daughter '63; 6 children (2 adopted)SO- SOBRetirement- doing well. Marriage in good health. Patient has never smoked. Alcohol use- noPt gets regular exercise   Social Drivers of Health   Financial Resource Strain: Low Risk  (04/02/2022)   Overall Financial Resource Strain (CARDIA)    Difficulty of Paying Living Expenses: Not hard at all  Food Insecurity: Patient Declined (06/17/2024)   Hunger Vital Sign    Worried About Running Out of Food in the Last Year:  Patient declined    Ran Out of Food in the Last Year: Patient declined  Transportation Needs: No Transportation Needs (06/17/2024)   PRAPARE - Administrator, Civil Service (Medical): No    Lack of Transportation (Non-Medical): No  Physical Activity: Inactive (04/02/2022)   Exercise Vital Sign    Days of Exercise per Week: 0 days    Minutes of Exercise per Session: 0 min  Stress: No Stress Concern Present (04/02/2022)   [employer] of Occupational Health - Occupational Stress Questionnaire    Feeling of Stress : Not at all  Social Connections: Socially Integrated (06/15/2024)   Social Connection and Isolation Panel    Frequency of Communication with Friends and Family: More than three times a week    Frequency of Social Gatherings with Friends and Family: More than three times a week    Attends Religious Services: More than 4 times per year    Active Member of [employer] or Organizations: Yes    Attends Club or  Organization Meetings: More than 4 times per year    Marital Status: Married  Intimate Partner Violence: Patient Declined (06/17/2024)   Humiliation, Afraid, Rape, and Kick questionnaire    Fear of Current or Ex-Partner: Patient declined    Emotionally Abused: Patient declined    Physically Abused: Patient declined    Sexually Abused: Patient declined    FAMILY HISTORY: Family History  Problem Relation Age of Onset   Bladder Cancer Father    Diabetes Father    Prostate cancer Father    Alzheimer's disease Mother    Diabetes Sister    Lung cancer Sister         smoker   Esophageal cancer Paternal Uncle    Colon cancer Neg Hx     ALLERGIES:  is allergic to codeine , other, erythromycin, iodinated contrast media, lisinopril, mirtazapine , mycophenolate mofetil, and sulfa  antibiotics.  MEDICATIONS:  Current Outpatient Medications  Medication Sig Dispense Refill   acetaminophen  (TYLENOL ) 325 MG tablet Take 2 tablets (650 mg total) by mouth every 6 (six) hours as  needed for moderate pain. 30 tablet 0   albuterol  (PROVENTIL ) (2.5 MG/3ML) 0.083% nebulizer solution Take 3 mLs (2.5 mg total) by nebulization every 6 (six) hours as needed for wheezing or shortness of breath. 75 mL 12   albuterol  (VENTOLIN  HFA) 108 (90 Base) MCG/ACT inhaler INHALE 2 PUFFS INTO THE LUNGS EVERY 6 HOURS AS NEEDED FOR WHEEZING OR SHORTNESS OF BREATH 6.7 g 3   ALPRAZolam  (XANAX ) 0.25 MG tablet Take 1 tablet (0.25 mg total) by mouth at bedtime as needed for anxiety. 30 tablet 1   arformoterol  (BROVANA ) 15 MCG/2ML NEBU USE 1 VIAL IN NEBULIZER TWICE DAILY 120 mL 0   augmented betamethasone dipropionate (DIPROLENE-AF) 0.05 % cream Apply 1 application  topically 2 (two) times daily as needed (for irritation).     clobetasol ointment (TEMOVATE) 0.05 % Apply 1 Application topically 2 (two) times daily as needed (for irritation).     diltiazem  (CARDIZEM ) 30 MG tablet Take 1 tablet (30 mg total) by mouth every 12 (twelve) hours. 60 tablet 11   docusate sodium  (COLACE) 100 MG capsule Take 100 mg by mouth daily.     famotidine  (PEPCID ) 20 MG tablet TAKE 1 TABLET BY MOUTH AT BEDTIME 30 tablet 0   fluticasone  (FLONASE ) 50 MCG/ACT nasal spray USE 1 SPRAY(S) IN EACH NOSTRIL ONCE DAILY AS NEEDED FOR ALLERGIES 16 g 0   furosemide  (LASIX ) 20 MG tablet Take 2 tablets ( 40 mg ) every morning may take extra 2 tablets for weight gain of 3 lbs 360 tablet 3   ipratropium (ATROVENT ) 0.02 % nebulizer solution Take 2.5 mLs (0.5 mg total) by nebulization 2 (two) times daily. 450 mL 3   latanoprost  (XALATAN ) 0.005 % ophthalmic solution Place 1 drop into both eyes at bedtime.     montelukast  (SINGULAIR ) 10 MG tablet Take 1 tablet (10 mg total) by mouth at bedtime. 90 tablet 3   Multiple Vitamin (MULTIVITAMIN WITH MINERALS) TABS tablet Take 1 tablet by mouth daily.     Nutritional Supplements (ENSURE HIGH PROTEIN) LIQD Take 0.5 Bottles by mouth daily as needed (for supplementation).     OXYGEN  Inhale 6 L/min into the  lungs continuous.     pantoprazole  (PROTONIX ) 40 MG tablet TAKE 1 TABLET BY MOUTH TWICE DAILY BEFORE A MEAL 180 tablet 0   spironolactone  (ALDACTONE ) 25 MG tablet Take 1/2 tablet ( 12.5 mg ) every morning     sucralfate  (CARAFATE ) 1 GM/10ML  suspension TAKE 10 MLS BY MOUTH EVERY 6 HOURS AS NEEDED FOR REFLUX 420 mL 0   triamcinolone  (KENALOG ) 0.1 % paste Use as directed 1 Application in the mouth or throat 2 (two) times daily. 5 g 12   triamcinolone  cream (KENALOG ) 0.1 % Apply 1 Application topically See admin instructions. Apply to affected leg(s) after showers and up to 2 additional times a day as needed for irritation     No current facility-administered medications for this visit.   Facility-Administered Medications Ordered in Other Visits  Medication Dose Route Frequency Provider Last Rate Last Admin   cyanocobalamin  (VITAMIN B12) injection 1,000 mcg  1,000 mcg Intramuscular Once Leontae Bostock T, PA-C        REVIEW OF SYSTEMS:   Constitutional: ( - ) fevers, ( - )  chills , ( - ) night sweats Eyes: ( - ) blurriness of vision, ( - ) double vision, ( - ) watery eyes Ears, nose, mouth, throat, and face: ( - ) mucositis, ( - ) sore throat Respiratory: ( - ) cough, ( + ) dyspnea, ( - ) wheezes Cardiovascular: ( - ) palpitation, ( - ) chest discomfort, ( - ) lower extremity swelling Gastrointestinal:  ( - ) nausea, ( - ) heartburn, ( - ) change in bowel habits Skin: ( - ) abnormal skin rashes Lymphatics: ( - ) new lymphadenopathy, ( - ) easy bruising Neurological: ( - ) numbness, ( - ) tingling, ( - ) new weaknesses Behavioral/Psych: ( - ) mood change, ( - ) new changes  All other systems were reviewed with the patient and are negative.  PHYSICAL EXAMINATION: ECOG PERFORMANCE STATUS: 1 - Symptomatic but completely ambulatory  Vitals:   09/13/24 1124  BP: (!) 112/54  Pulse: 88  Resp: 16  Temp: (!) 97.5 F (36.4 C)  SpO2: 96%      Filed Weights   09/13/24 1124  Weight: 98 lb  11.2 oz (44.8 kg)       GENERAL:  elderly female in NAD  SKIN: skin color, texture, turgor are normal, no rashes or significant lesions EYES: conjunctiva are pink and non-injected, sclera clear LUNGS: clear to auscultation and percussion with normal breathing effort HEART: regular rate & rhythm and no murmurs and no lower extremity edema Musculoskeletal: no cyanosis of digits and no clubbing  PSYCH: alert & oriented x 3, fluent speech NEURO: no focal motor/sensory deficits  LABORATORY DATA:  I have reviewed the data as listed    Latest Ref Rng & Units 09/13/2024   11:03 AM 08/19/2024    2:11 PM 07/28/2024    1:47 PM  CBC  WBC 4.0 - 10.5 K/uL 7.2  6.3  6.6   Hemoglobin 12.0 - 15.0 g/dL 89.4  9.2  9.3   Hematocrit 36.0 - 46.0 % 32.9  28.8  29.6   Platelets 150 - 400 K/uL 205  204  204        Latest Ref Rng & Units 09/13/2024   11:03 AM 08/19/2024    2:11 PM 07/28/2024    1:47 PM  CMP  Glucose 70 - 99 mg/dL 78  879  894   BUN 8 - 23 mg/dL 56  56  63   Creatinine 0.44 - 1.00 mg/dL 8.36  8.47  8.43   Sodium 135 - 145 mmol/L 139  135  139   Potassium 3.5 - 5.1 mmol/L 4.3  4.3  4.1   Chloride 98 - 111 mmol/L 96  93  96   CO2 22 - 32 mmol/L 32  35  37   Calcium  8.9 - 10.3 mg/dL 9.5  9.2  9.2   Total Protein 6.5 - 8.1 g/dL 7.5  6.8  6.7   Total Bilirubin 0.0 - 1.2 mg/dL 0.5  0.5  0.6   Alkaline Phos 38 - 126 U/L 105  82  74   AST 15 - 41 U/L 29  21  21    ALT 0 - 44 U/L 9  10  9     RADIOGRAPHIC STUDIES: No results found.    ASSESSMENT & PLAN ZANA BIANCARDI is a 88 y.o. female who presents to the hematology clinic for evaluation of macrocytic anemia  #Macrocytic anemia: #Vitamin B12 deficiency: #Anemia of chronic disease --Suspect multifactorial including vitamin b12 deficinecy, CKD and chronic disease.  --Iron  panel from 12/30/2022 reviewed without deficiency.  --Patient had retacrit  10,000 units q 4 weeks, last received 12/30/2022. Will administer again today.   --Workup from 02/12/2023 didn't reveal hemolysis, paraproteinemia, folate deficiency. --bone marrow biopsy on 08/14/2023 showed no clear cause for the macrocytosis, though excessive iron  deposition was noted. HFE genetic testing was negative in November 2024 -- no evidence of cirrhosis on prior imaging from 2023 (Chest imaging, but liver visualized). --Labs from today show WBC 4.7, Hgb 8.7, MCV 113.2, Plt 256, creatinine is overall stable at 1.64, LFTS normal. Vitamin B12 and iron  levels show no deficiency.  --recommend to continue with monthly retacrit  and B12 injection.   --RTC q 4 weeks for labs/injection and 8 weeks for labs/visit/injection  No orders of the defined types were placed in this encounter.   All questions were answered. The patient knows to call the clinic with any problems, questions or concerns.  I have spent a total of 30 minutes minutes of face-to-face and non-face-to-face time, preparing to see the patient, obtaining and/or reviewing separately obtained history, performing a medically appropriate examination, counseling and educating the patient, ordering tests/procedures,documenting clinical information in the electronic health record, and care coordination.   Johnston Police PA-C Dept of Hematology and Oncology [facility] Phone: [phone]

## 2024-09-14 ENCOUNTER — Other Ambulatory Visit: Payer: Self-pay | Admitting: Gastroenterology

## 2024-09-16 LAB — METHYLMALONIC ACID, SERUM: Methylmalonic Acid, Quantitative: 610 nmol/L — ABNORMAL HIGH (ref 0–378)

## 2024-09-18 DIAGNOSIS — J9620 Acute and chronic respiratory failure, unspecified whether with hypoxia or hypercapnia: Secondary | ICD-10-CM | POA: Diagnosis not present

## 2024-09-20 ENCOUNTER — Telehealth: Payer: Self-pay

## 2024-09-20 NOTE — Patient Instructions (Signed)
 Orlean KATHEE Daring - I am sorry I was unable to reach you today for our scheduled appointment. I work with Rollene Almarie LABOR, MD and am calling to support your healthcare needs. Please contact me at [phone] at your earliest convenience. I look forward to speaking with you soon.   Thank you,   Heddy Shutter, RN, MSN, BSN, CCM [facility], Population Health Case Manager Phone: [phone]

## 2024-09-25 ENCOUNTER — Other Ambulatory Visit: Payer: Self-pay

## 2024-09-25 ENCOUNTER — Emergency Department (HOSPITAL_BASED_OUTPATIENT_CLINIC_OR_DEPARTMENT_OTHER)

## 2024-09-25 ENCOUNTER — Inpatient Hospital Stay (HOSPITAL_COMMUNITY)

## 2024-09-25 ENCOUNTER — Encounter (HOSPITAL_BASED_OUTPATIENT_CLINIC_OR_DEPARTMENT_OTHER): Payer: Self-pay | Admitting: Emergency Medicine

## 2024-09-25 ENCOUNTER — Inpatient Hospital Stay (HOSPITAL_BASED_OUTPATIENT_CLINIC_OR_DEPARTMENT_OTHER)
Admission: EM | Admit: 2024-09-25 | Discharge: 2024-09-28 | DRG: 378 | Disposition: A | Attending: Internal Medicine | Admitting: Internal Medicine

## 2024-09-25 DIAGNOSIS — I2721 Secondary pulmonary arterial hypertension: Secondary | ICD-10-CM | POA: Diagnosis present

## 2024-09-25 DIAGNOSIS — K922 Gastrointestinal hemorrhage, unspecified: Principal | ICD-10-CM

## 2024-09-25 DIAGNOSIS — N1832 Chronic kidney disease, stage 3b: Secondary | ICD-10-CM | POA: Diagnosis not present

## 2024-09-25 DIAGNOSIS — K31811 Angiodysplasia of stomach and duodenum with bleeding: Secondary | ICD-10-CM

## 2024-09-25 DIAGNOSIS — Z86711 Personal history of pulmonary embolism: Secondary | ICD-10-CM | POA: Diagnosis present

## 2024-09-25 DIAGNOSIS — K921 Melena: Secondary | ICD-10-CM | POA: Diagnosis not present

## 2024-09-25 DIAGNOSIS — N281 Cyst of kidney, acquired: Secondary | ICD-10-CM | POA: Diagnosis not present

## 2024-09-25 DIAGNOSIS — K5520 Angiodysplasia of colon without hemorrhage: Secondary | ICD-10-CM | POA: Diagnosis not present

## 2024-09-25 DIAGNOSIS — Z515 Encounter for palliative care: Secondary | ICD-10-CM | POA: Diagnosis not present

## 2024-09-25 DIAGNOSIS — I130 Hypertensive heart and chronic kidney disease with heart failure and stage 1 through stage 4 chronic kidney disease, or unspecified chronic kidney disease: Secondary | ICD-10-CM | POA: Diagnosis not present

## 2024-09-25 DIAGNOSIS — J9611 Chronic respiratory failure with hypoxia: Secondary | ICD-10-CM | POA: Diagnosis not present

## 2024-09-25 DIAGNOSIS — L8990 Pressure ulcer of unspecified site, unspecified stage: Secondary | ICD-10-CM | POA: Insufficient documentation

## 2024-09-25 DIAGNOSIS — I3139 Other pericardial effusion (noninflammatory): Secondary | ICD-10-CM | POA: Diagnosis present

## 2024-09-25 DIAGNOSIS — R0902 Hypoxemia: Secondary | ICD-10-CM

## 2024-09-25 DIAGNOSIS — K31819 Angiodysplasia of stomach and duodenum without bleeding: Secondary | ICD-10-CM | POA: Diagnosis present

## 2024-09-25 DIAGNOSIS — I5032 Chronic diastolic (congestive) heart failure: Secondary | ICD-10-CM | POA: Diagnosis not present

## 2024-09-25 DIAGNOSIS — J849 Interstitial pulmonary disease, unspecified: Secondary | ICD-10-CM | POA: Diagnosis not present

## 2024-09-25 DIAGNOSIS — D62 Acute posthemorrhagic anemia: Secondary | ICD-10-CM | POA: Diagnosis not present

## 2024-09-25 DIAGNOSIS — N179 Acute kidney failure, unspecified: Secondary | ICD-10-CM | POA: Diagnosis present

## 2024-09-25 DIAGNOSIS — D649 Anemia, unspecified: Secondary | ICD-10-CM

## 2024-09-25 DIAGNOSIS — Z7189 Other specified counseling: Secondary | ICD-10-CM | POA: Diagnosis not present

## 2024-09-25 LAB — COMPREHENSIVE METABOLIC PANEL WITH GFR
ALT: 9 U/L (ref 0–44)
AST: 25 U/L (ref 15–41)
Albumin: 4.2 g/dL (ref 3.5–5.0)
Alkaline Phosphatase: 85 U/L (ref 38–126)
Anion gap: 14 (ref 5–15)
BUN: 99 mg/dL — ABNORMAL HIGH (ref 8–23)
CO2: 30 mmol/L (ref 22–32)
Calcium: 9.1 mg/dL (ref 8.9–10.3)
Chloride: 96 mmol/L — ABNORMAL LOW (ref 98–111)
Creatinine, Ser: 2.01 mg/dL — ABNORMAL HIGH (ref 0.44–1.00)
GFR, Estimated: 24 mL/min — ABNORMAL LOW (ref >60)
Glucose, Bld: 137 mg/dL — ABNORMAL HIGH (ref 70–99)
Potassium: 4.4 mmol/L (ref 3.5–5.1)
Sodium: 140 mmol/L (ref 135–145)
Total Bilirubin: 0.4 mg/dL (ref 0.0–1.2)
Total Protein: 6.7 g/dL (ref 6.5–8.1)

## 2024-09-25 LAB — URINALYSIS, MICROSCOPIC (REFLEX)

## 2024-09-25 LAB — CBC WITH DIFFERENTIAL/PLATELET
Abs Immature Granulocytes: 0.06 K/uL (ref 0.00–0.07)
Basophils Absolute: 0 K/uL (ref 0.0–0.1)
Basophils Relative: 0 %
Eosinophils Absolute: 0 K/uL (ref 0.0–0.5)
Eosinophils Relative: 1 %
HCT: 21.8 % — ABNORMAL LOW (ref 36.0–46.0)
Hemoglobin: 6.7 g/dL — CL (ref 12.0–15.0)
Immature Granulocytes: 1 %
Lymphocytes Relative: 8 %
Lymphs Abs: 0.7 K/uL (ref 0.7–4.0)
MCH: 34.5 pg — ABNORMAL HIGH (ref 26.0–34.0)
MCHC: 30.7 g/dL (ref 30.0–36.0)
MCV: 112.4 fL — ABNORMAL HIGH (ref 80.0–100.0)
Monocytes Absolute: 0.4 K/uL (ref 0.1–1.0)
Monocytes Relative: 5 %
Neutro Abs: 6.7 K/uL (ref 1.7–7.7)
Neutrophils Relative %: 85 %
Platelets: 208 K/uL (ref 150–400)
RBC: 1.94 MIL/uL — ABNORMAL LOW (ref 3.87–5.11)
RDW: 13.9 % (ref 11.5–15.5)
WBC: 7.9 K/uL (ref 4.0–10.5)
nRBC: 0 % (ref 0.0–0.2)

## 2024-09-25 LAB — LACTIC ACID, PLASMA
Lactic Acid, Venous: 2.3 mmol/L (ref 0.5–1.9)
Lactic Acid, Venous: 1.6 mmol/L (ref 0.5–1.9)

## 2024-09-25 LAB — URINALYSIS, ROUTINE W REFLEX MICROSCOPIC
Bilirubin Urine: NEGATIVE
Glucose, UA: NEGATIVE mg/dL
Ketones, ur: NEGATIVE mg/dL
Nitrite: NEGATIVE
Protein, ur: NEGATIVE mg/dL
Specific Gravity, Urine: 1.01 (ref 1.005–1.030)
pH: 5.5 (ref 5.0–8.0)

## 2024-09-25 LAB — OCCULT BLOOD X 1 CARD TO LAB, STOOL: Fecal Occult Bld: POSITIVE — AB

## 2024-09-25 LAB — LIPASE, BLOOD: Lipase: 50 U/L (ref 11–51)

## 2024-09-25 MED ORDER — ONDANSETRON HCL 4 MG/2ML IJ SOLN
4.0000 mg | Freq: Four times a day (QID) | INTRAMUSCULAR | Status: DC | PRN
  Administered 2024-09-26: 4 mg via INTRAVENOUS

## 2024-09-25 MED ORDER — ALPRAZOLAM 0.25 MG PO TABS
0.2500 mg | ORAL_TABLET | Freq: Every evening | ORAL | Status: DC | PRN
  Administered 2024-09-25 – 2024-09-27 (×2): 0.25 mg via ORAL
  Filled 2024-09-25 (×2): qty 1

## 2024-09-25 MED ORDER — PANTOPRAZOLE SODIUM 40 MG IV SOLR: 40.0000 mg | Freq: Two times a day (BID) | INTRAVENOUS | Status: DC

## 2024-09-25 MED ORDER — SODIUM CHLORIDE 0.9% IV SOLUTION: Freq: Once | INTRAVENOUS | Status: AC

## 2024-09-25 MED ORDER — ONDANSETRON HCL 4 MG PO TABS: 4.0000 mg | ORAL_TABLET | Freq: Four times a day (QID) | ORAL | Status: DC | PRN

## 2024-09-25 MED ORDER — ARFORMOTEROL TARTRATE 15 MCG/2ML IN NEBU
15.0000 ug | INHALATION_SOLUTION | Freq: Two times a day (BID) | RESPIRATORY_TRACT | Status: DC
  Administered 2024-09-26 – 2024-09-28 (×5): 15 ug via RESPIRATORY_TRACT
  Filled 2024-09-25 (×5): qty 2

## 2024-09-25 MED ORDER — SODIUM CHLORIDE 0.9% FLUSH
3.0000 mL | Freq: Two times a day (BID) | INTRAVENOUS | Status: DC
  Administered 2024-09-26 – 2024-09-28 (×5): 3 mL via INTRAVENOUS

## 2024-09-25 MED ORDER — PANTOPRAZOLE SODIUM 40 MG IV SOLR
40.0000 mg | INTRAVENOUS | Status: DC
  Filled 2024-09-25: qty 10

## 2024-09-25 MED ORDER — SODIUM CHLORIDE 0.9 % IV BOLUS
500.0000 mL | Freq: Once | INTRAVENOUS | Status: AC
  Administered 2024-09-25: 500 mL via INTRAVENOUS

## 2024-09-25 MED ORDER — IPRATROPIUM BROMIDE 0.02 % IN SOLN
0.5000 mg | Freq: Two times a day (BID) | RESPIRATORY_TRACT | Status: DC
  Administered 2024-09-26 – 2024-09-28 (×5): 0.5 mg via RESPIRATORY_TRACT
  Filled 2024-09-25 (×5): qty 2.5

## 2024-09-25 MED ORDER — MONTELUKAST SODIUM 10 MG PO TABS
10.0000 mg | ORAL_TABLET | Freq: Every day | ORAL | Status: DC
  Administered 2024-09-26 – 2024-09-27 (×2): 10 mg via ORAL
  Filled 2024-09-25 (×2): qty 1

## 2024-09-25 MED ORDER — PANTOPRAZOLE SODIUM 40 MG IV SOLR
40.0000 mg | Freq: Two times a day (BID) | INTRAVENOUS | Status: DC
  Administered 2024-09-26 – 2024-09-28 (×5): 40 mg via INTRAVENOUS
  Filled 2024-09-25 (×5): qty 10

## 2024-09-25 MED ORDER — POLYETHYLENE GLYCOL 3350 17 G PO PACK: 17.0000 g | PACK | Freq: Every day | ORAL | Status: DC | PRN

## 2024-09-25 MED ORDER — PANTOPRAZOLE SODIUM 40 MG IV SOLR
40.0000 mg | INTRAVENOUS | Status: AC
  Administered 2024-09-25 (×2): 40 mg via INTRAVENOUS

## 2024-09-25 MED ORDER — SODIUM CHLORIDE 0.9% IV SOLUTION: Freq: Once | INTRAVENOUS | Status: DC

## 2024-09-25 MED ORDER — FUROSEMIDE 40 MG PO TABS
40.0000 mg | ORAL_TABLET | Freq: Every day | ORAL | Status: DC
  Administered 2024-09-27 – 2024-09-28 (×2): 40 mg via ORAL
  Filled 2024-09-25 (×2): qty 1

## 2024-09-25 MED ORDER — ALBUTEROL SULFATE (2.5 MG/3ML) 0.083% IN NEBU: 2.5000 mg | INHALATION_SOLUTION | Freq: Four times a day (QID) | RESPIRATORY_TRACT | Status: DC | PRN

## 2024-09-25 MED ORDER — ACETAMINOPHEN 650 MG RE SUPP: 650.0000 mg | Freq: Four times a day (QID) | RECTAL | Status: DC | PRN

## 2024-09-25 MED ORDER — ACETAMINOPHEN 325 MG PO TABS
650.0000 mg | ORAL_TABLET | Freq: Four times a day (QID) | ORAL | Status: DC | PRN
  Administered 2024-09-26 – 2024-09-27 (×2): 650 mg via ORAL
  Filled 2024-09-25 (×2): qty 2

## 2024-09-25 NOTE — ED Triage Notes (Signed)
 Pt reports generalized abd pain and dark stools since Fri; denies pain at this time

## 2024-09-25 NOTE — Hospital Course (Signed)
 Paula Ward is a 88 y.o. female with medical history significant for ILD, chronic hypoxic respiratory failure on 4 L O2 [place], chronic HFpEF, PAH, chronic large pericardial effusion, moderate aortic stenosis, anemia of chronic disease and B12 deficiency, history of GI bleed due to gastric/small bowel AVMs, history of PE not on AC s/p IVC filter, CKD stage IIIb, CTD/scleroderma, renal cell carcinoma who is admitted with acute on chronic blood loss anemia due to GI bleed.

## 2024-09-25 NOTE — ED Notes (Signed)
 [facility] here to transport patient.

## 2024-09-25 NOTE — ED Provider Notes (Signed)
 [facility] Provider Note   CSN: [id number] Arrival date & time: 09/25/24  1503     Patient presents with: Abdominal Pain   Paula Ward is a 88 y.o. female here for evaluation of generalized abdominal pain and dark stool.  Symptoms started 4 days ago.  Has had some cramping mid abdominal pain, relieved with bowel movements.  Noted her stool has been black and tarry.  She has been followed by Dr. Shila with gastroenterology.  She is unsure of her last colonoscopy.  She has known history of anemia typically hemoglobin around 10.  States she has chronic hypoxic respiratory failure due to pulmonary hypertension, CHF, COPD.  She denies any fever, syncope, lightheadedness, chest pain, shortness of breath, back pain, dysuria or hematuria.  No PND, orthopnea, swelling to lower extremities.   Last echo 2/25-EF 70%  Per last GI note 4/24 patient has a history of small bowel AVM with chronic anemia   HPI     Prior to Admission medications   Medication Sig Start Date End Date Taking? Authorizing Provider  acetaminophen  (TYLENOL ) 325 MG tablet Take 2 tablets (650 mg total) by mouth every 6 (six) hours as needed for moderate pain. 07/18/22   Christopher Savannah, PA-C  albuterol  (PROVENTIL ) (2.5 MG/3ML) 0.083% nebulizer solution Take 3 mLs (2.5 mg total) by nebulization every 6 (six) hours as needed for wheezing or shortness of breath. 09/03/23   Pokhrel, Laxman, MD  albuterol  (VENTOLIN  HFA) 108 (90 Base) MCG/ACT inhaler INHALE 2 PUFFS INTO THE LUNGS EVERY 6 HOURS AS NEEDED FOR WHEEZING OR SHORTNESS OF BREATH 03/13/23   Rollene Almarie LABOR, MD  ALPRAZolam  (XANAX ) 0.25 MG tablet Take 1 tablet (0.25 mg total) by mouth at bedtime as needed for anxiety. 06/28/24   Rollene Almarie LABOR, MD  arformoterol  (BROVANA ) 15 MCG/2ML NEBU USE 1 VIAL IN NEBULIZER TWICE DAILY 08/15/24   Hunsucker, Donnice SAUNDERS, MD  augmented betamethasone dipropionate (DIPROLENE-AF) 0.05 %  cream Apply 1 application  topically 2 (two) times daily as needed (for irritation). 07/13/23   [provider]  clobetasol ointment (TEMOVATE) 0.05 % Apply 1 Application topically 2 (two) times daily as needed (for irritation). 04/20/23   [provider]  diltiazem  (CARDIZEM ) 30 MG tablet Take 1 tablet (30 mg total) by mouth every 12 (twelve) hours. 12/04/23   Emelia Josefa HERO, NP  docusate sodium  (COLACE) 100 MG capsule Take 100 mg by mouth daily.    [provider]  famotidine  (PEPCID ) 20 MG tablet TAKE 1 TABLET BY MOUTH AT BEDTIME 09/14/24   Nandigam, Kavitha V, MD  fluticasone  (FLONASE ) 50 MCG/ACT nasal spray USE 1 SPRAY(S) IN EACH NOSTRIL ONCE DAILY AS NEEDED FOR ALLERGIES 12/04/23   Rollene Almarie LABOR, MD  furosemide  (LASIX ) 20 MG tablet Take 2 tablets ( 40 mg ) every morning may take extra 2 tablets for weight gain of 3 lbs 02/19/24   Pietro Redell RAMAN, MD  ipratropium (ATROVENT ) 0.02 % nebulizer solution Take 2.5 mLs (0.5 mg total) by nebulization 2 (two) times daily. 06/06/24   Hunsucker, Donnice SAUNDERS, MD  latanoprost  (XALATAN ) 0.005 % ophthalmic solution Place 1 drop into both eyes at bedtime. 09/23/22   [provider]  montelukast  (SINGULAIR ) 10 MG tablet Take 1 tablet (10 mg total) by mouth at bedtime. 08/10/24   Rollene Almarie LABOR, MD  Multiple Vitamin (MULTIVITAMIN WITH MINERALS) TABS tablet Take 1 tablet by mouth daily.    [provider]  Nutritional  Supplements (ENSURE HIGH PROTEIN) LIQD Take 0.5 Bottles by mouth daily as needed (for supplementation).    [provider]  OXYGEN  Inhale 6 L/min into the lungs continuous.    [provider]  pantoprazole  (PROTONIX ) 40 MG tablet TAKE 1 TABLET BY MOUTH TWICE DAILY BEFORE A MEAL 09/12/24   Nandigam, Kavitha V, MD  spironolactone  (ALDACTONE ) 25 MG tablet Take 1/2 tablet ( 12.5 mg ) every morning 02/18/24   Pietro Redell RAMAN, MD  sucralfate  (CARAFATE ) 1 GM/10ML suspension TAKE 10 MLS BY  MOUTH EVERY 6 HOURS AS NEEDED FOR REFLUX 04/18/24   Nandigam, Kavitha V, MD  triamcinolone  (KENALOG ) 0.1 % paste Use as directed 1 Application in the mouth or throat 2 (two) times daily. 06/28/24   Rollene Almarie LABOR, MD  triamcinolone  cream (KENALOG ) 0.1 % Apply 1 Application topically See admin instructions. Apply to affected leg(s) after showers and up to 2 additional times a day as needed for irritation 07/07/23   [provider]    Allergies: Codeine , Other, Erythromycin, Iodinated contrast media, Lisinopril, Mirtazapine , Mycophenolate mofetil, and Sulfa  antibiotics    Review of Systems  Constitutional: Negative.   HENT: Negative.    Respiratory: Negative.    Cardiovascular: Negative.   Gastrointestinal:  Positive for abdominal pain and blood in stool. Negative for anal bleeding, constipation, diarrhea, nausea, rectal pain and vomiting.  Genitourinary: Negative.   Musculoskeletal: Negative.   Skin: Negative.   Neurological: Negative.   All other systems reviewed and are negative.   Updated Vital Signs BP (!) 124/46   Pulse 75   Temp (!) 97.5 F (36.4 C) (Oral)   Resp 20   Ht 5' (1.524 m)   Wt 44.8 kg   SpO2 100%   BMI 19.28 kg/m   Physical Exam Vitals and nursing note reviewed. Exam conducted with a chaperone present.  Constitutional:      General: She is not in acute distress.    Appearance: She is well-developed. She is not ill-appearing, toxic-appearing or diaphoretic.  HENT:     Head: Atraumatic.     Mouth/Throat:     Mouth: Mucous membranes are moist.  Eyes:     Pupils: Pupils are equal, round, and reactive to light.  Cardiovascular:     Rate and Rhythm: Normal rate.     Heart sounds: Normal heart sounds.  Pulmonary:     Effort: Pulmonary effort is normal. No respiratory distress.     Breath sounds: Normal breath sounds.  Abdominal:     General: Bowel sounds are normal. There is no distension.     Palpations: Abdomen is soft.     Tenderness: There  is generalized abdominal tenderness.  Genitourinary:    Comments: Nurse present in room for exam.  Stage I pressure wound sacral area.  Dark, tarry stool in rectal vault. Musculoskeletal:        General: Normal range of motion.     Cervical back: Normal range of motion.     Comments: No bony tenderness, compartments soft, no lower extremity edema  Skin:    General: Skin is warm and dry.     Capillary Refill: Capillary refill takes less than 2 seconds.     Comments: #2  Pressure wound sacral region and midline thoracic region  Neurological:     General: No focal deficit present.     Mental Status: She is alert.     Sensory: Sensation is intact.     Motor: Motor function is intact.  Gait: Gait is intact.  Psychiatric:        Mood and Affect: Mood normal.     (all labs ordered are listed, but only abnormal results are displayed) Labs Reviewed  COMPREHENSIVE METABOLIC PANEL WITH GFR - Abnormal; Notable for the following components:      Result Value   Chloride 96 (*)    Glucose, Bld 137 (*)    BUN 99 (*)    Creatinine, Ser 2.01 (*)    GFR, Estimated 24 (*)    All other components within normal limits  URINALYSIS, ROUTINE W REFLEX MICROSCOPIC - Abnormal; Notable for the following components:   Hgb urine dipstick TRACE (*)    Leukocytes,Ua SMALL (*)    All other components within normal limits  CBC WITH DIFFERENTIAL/PLATELET - Abnormal; Notable for the following components:   RBC 1.94 (*)    Hemoglobin 6.7 (*)    HCT 21.8 (*)    MCV 112.4 (*)    MCH 34.5 (*)    All other components within normal limits  OCCULT BLOOD X 1 CARD TO LAB, STOOL - Abnormal; Notable for the following components:   Fecal Occult Bld POSITIVE (*)    All other components within normal limits  LACTIC ACID, PLASMA - Abnormal; Notable for the following components:   Lactic Acid, Venous 2.3 (*)    All other components within normal limits  URINALYSIS, MICROSCOPIC (REFLEX) - Abnormal; Notable for the  following components:   Bacteria, UA RARE (*)    All other components within normal limits  LIPASE, BLOOD  LACTIC ACID, PLASMA    EKG: None  Radiology: CT ABDOMEN PELVIS WO CONTRAST Result Date: 09/25/2024 CLINICAL DATA:  Generalized abdominal pain, dark stools EXAM: CT ABDOMEN AND PELVIS WITHOUT CONTRAST TECHNIQUE: Multidetector CT imaging of the abdomen and pelvis was performed following the standard protocol without IV contrast. Unenhanced CT was performed per clinician order. Lack of IV contrast limits sensitivity and specificity, especially for evaluation of abdominal/pelvic solid viscera. RADIATION DOSE REDUCTION: This exam was performed according to the departmental dose-optimization program which includes automated exposure control, adjustment of the mA and/or kV according to patient size and/or use of iterative reconstruction technique. COMPARISON:  04/04/2019, 06/15/2024 FINDINGS: Lower chest: There is a massive pericardial effusion, measuring up to 5.1 cm in thickness, unchanged since the previous CT exam. There is dense calcification of the mitral and aortic valves. Stable cardiomegaly. No acute pleural or parenchymal lung disease. Hepatobiliary: Unremarkable unenhanced appearance of the liver and gallbladder. Pancreas: Unremarkable unenhanced appearance. Spleen: Unremarkable unenhanced appearance. Adrenals/Urinary Tract: There are 2 subcentimeter hemorrhagic cyst within the lower pole left kidney. Within the ventral aspect of the mid kidney there is an indeterminate hyperdense lesion measuring 1.2 x 1.5 cm and measuring 52 HU. Further evaluation with ultrasound may be useful to exclude solid lesion if the patient would be a therapy candidate should neoplasm be detected. Chronic scarring within the upper pole left kidney consistent with prior cryoablation. The right kidney is unremarkable. No urinary tract calculi or obstructive uropathy within either kidney. Bladder is minimally distended,  without gross abnormality. The adrenals are stable. Stomach/Bowel: No bowel obstruction or ileus. Prior appendectomy per history. Distal colonic diverticulosis without diverticulitis. No bowel wall thickening or inflammatory change. Vascular/Lymphatic: Diffuse aortic atherosclerosis. IVC filter. No pathologic adenopathy. Reproductive: Uterus is atrophic.  No adnexal masses. Other: No free fluid or free intraperitoneal gas. No abdominal wall hernia. Musculoskeletal: There are no acute or destructive bony abnormalities. Reconstructed images  demonstrate no additional findings. IMPRESSION: 1. Stable massive pericardial effusion measuring up to 5.1 cm in thickness. 2. Stable cardiomegaly, with dense calcifications of the mitral and aortic valves. 3. Distal colonic diverticulosis without diverticulitis. 4. Indeterminate 1.5 cm hyperdense left renal lesion, new since prior study. If the patient would be a candidate should renal neoplasm be detected, renal ultrasound may be useful to exclude solid lesion. 5.  Aortic Atherosclerosis (ICD10-I70.0). Electronically Signed   By: Ozell Daring M.D.   On: 09/25/2024 17:13     .Critical Care  Performed by: Edie Rosebud LABOR, PA-C Authorized by: Edie Rosebud LABOR, PA-C   Critical care provider statement:    Critical care time (minutes):  30   Critical care was necessary to treat or prevent imminent or life-threatening deterioration of the following conditions: Symptomatic anemia, GI bleed.   Critical care was time spent personally by me on the following activities:  Development of treatment plan with patient or surrogate, discussions with consultants, evaluation of patient's response to treatment, examination of patient, ordering and review of laboratory studies, ordering and review of radiographic studies, ordering and performing treatments and interventions, pulse oximetry, re-evaluation of patient's condition and review of old charts    Medications Ordered in the  ED  pantoprazole  (PROTONIX ) injection 40 mg (40 mg Intravenous Given 09/25/24 1735)    Followed by  pantoprazole  (PROTONIX ) injection 40 mg (has no administration in time range)  0.9 %  sodium chloride  infusion (Manually program via Guardrails IV Fluids) (has no administration in time range)  sodium chloride  0.9 % bolus 500 mL (0 mLs Intravenous Stopped 09/25/24 4490)   88 year old here for evaluation of generalized abdominal pain and dark stool.  Started 4 days ago.  History of small bowel AVMs.  She denies any NSAID use, EtOH use, anticoagulation use.  Followed by [facility].  No lightheadedness or dizziness.  She has chronic hypoxic respiratory failure which she states is unchanged.  Here she appears otherwise well, hemodynamically stable.  She has dark [place] stool on exam.  Abdomen soft, nontender.  Does not appear grossly fluid overloaded.  Will plan on labs, imaging, reassess  Labs and imaging personally viewed interpreted CBC without leukocytosis, hemoglobin 6.7, baseline 9-10 Metabolic panel creatinine 2.01, mild increase from baseline Lactic 2.3 Lipase 50 UA negative for infection  CT abdomen pelvis stable large pericardial effusion, new renal mass Occult positive  Patient reassessed.  Discussed labs and imaging.  Discussed results with patient's son on telephone per patient request.  Will plan for admission, discussed with GI.  With regards to her large pericardial effusion this is known, stable.  She denies any chest pain, shortness of breath.  She is on her chronic home oxygen  here.  She does not appear grossly fluid overloaded.  Low suspicion for tamponade  Discussed with gastroenterology as well as medicine team will plan on admission.  Patient getting Protonix  infusion.  Orders placed for type and screen as well as blood transfusion.  Will be admitted for further workup and management of GI bleed and symptomatic anemia.  ADDEND: Type and screen, blood transfusion cannot be ordered  while inpatient here for lab and charge nurse.  Order was canceled.  Will need to be reordered once she is at her admitting hospital.   Clinical Course as of 09/25/24 1850  Sun Sep 25, 2024  1751 Dr.Cunningham with [facility] will see in consult [BH]  1822 Dr. Tobie with medicine [BH]    Clinical Course User Index [  BH] Naomie Crow A, PA-C                                 Medical Decision Making Amount and/or Complexity of Data Reviewed External Data Reviewed: labs, radiology, ECG and notes. Labs: ordered. Decision-making details documented in ED Course. Radiology: ordered and independent interpretation performed. Decision-making details documented in ED Course. ECG/medicine tests: ordered and independent interpretation performed. Decision-making details documented in ED Course.  Risk OTC drugs. Prescription drug management. Parenteral controlled substances. Decision regarding hospitalization. Diagnosis or treatment significantly limited by social determinants of health.       Final diagnoses:  Upper GI bleed  Symptomatic anemia    ED Discharge Orders     None          Gautham Hewins A, PA-C 09/25/24 1850    Armenta Canning, MD 09/25/24 1856

## 2024-09-25 NOTE — Progress Notes (Addendum)
 Plan of Care Note for accepted transfer   Patient: Paula Ward MRN: [id number]   DOA: 09/25/2024  Facility requesting transfer: [facility] Requesting Provider: Rosebud Fass, PA Reason for transfer: Acute on chronic anemia due to GI bleed Facility course:  Patient is an 88 year old female with medical history significant for ILD, chronic hypoxic respiratory failure on 4 L O2 [place], chronic HFpEF, PAH, chronic pericardial effusion, moderate left stenosis, anemia of chronic disease and B12 deficiency, history of GI bleed due to gastric/small bowel AVMs, history of PE not on AC s/p IVC filter, CKD stage IIIb, CTD/scleroderma, renal cell carcinoma who presented to the ED for evaluation of abdominal pain and dark stools for the last 4 days.  Currently hemodynamically stable.  Labs notable for hemoglobin 6.7 (10.5 on 09/13/2024), FOBT is positive.  Lactic acid 2.3.  Creatinine 2.01 compared to baseline ~1.5-1.6.  CT abdomen/pelvis without contrast showed stable massive pericardial effusion up to 5.1 cm in thickness, stable cardiomegaly with dense calcifications of mitral and aortic valves.  Distal colonic diverticulosis without diverticulitis noted.  An indeterminate 1.5 cm hyperdense left renal lesion also seen, new since prior study.  Patient was given IV Protonix  80 mg in total and 500 mLs normal saline.  EDP discussed with on-call for [facility] Dr. Stacia who recommended medical admission and their team will see in consultation.  The hospitalist service was consulted for admission.  Plan of care: The patient is accepted for admission to [facility] unit, at [facility].  Author: Jorie Blanch, MD 09/25/2024  Check [email or web address] for on-call coverage.  Nursing staff, Please call [facility] Admits & Consults System-Wide number on Amion as soon as patient's arrival, so appropriate admitting provider can evaluate the pt.

## 2024-09-25 NOTE — H&P (Signed)
 History and Physical    Paula Ward FMW:[id number] DOB: 11/04/1935 DOA: 09/25/2024  PCP: Rollene Almarie LABOR, MD  Patient coming from: Home  I have personally briefly reviewed patient's old medical records in [facility]  Chief Complaint: Abdominal pain, dark stools  HPI: Paula Ward is a 88 y.o. female with medical history significant for ILD, chronic hypoxic respiratory failure on 4 L O2 [place], chronic HFpEF, PAH, chronic large pericardial effusion, moderate aortic stenosis, anemia of chronic disease and B12 deficiency, history of GI bleed due to gastric/small bowel AVMs, history of PE not on AC s/p IVC filter, CKD stage IIIb, CTD/scleroderma, renal cell carcinoma who presented to the ED for evaluation of abdominal pain and dark stools.  Patient states that she began experience pain across her lower abdomen this past Friday (09/23/2024).  Since then she has been having dark black appearing stool.  She has not had any nausea or vomiting.  She has not seen any red blood per rectum.  She has been feeling fatigued and has been getting lightheaded when exerting herself.  She also has some increased shortness of breath with exertion compared to her baseline.  She has had similar symptoms in the past and due to persistent dark stools she came to the ED due to concern for recurrent GI bleed.  She does not take any blood thinners or use NSAIDs.  [facility] ED Course  Labs/Imaging on admission: I have personally reviewed following labs and imaging studies.  Initial vitals showed BP 107/41, pulse 83, RR 20, temp 97.5 F, SpO2 100% on room air.  Labs showed hemoglobin 6.7, HCT 21.8, MCV 112.4, platelets 208, WBC 7.9, sodium 140, potassium 4.4, bicarb 30, BUN 99, creatinine 2.01, serum glucose 137, LFTs within normal limits, lipase 50, lactic acid 2.3 > 1.6.  FOBT was positive.  UA negative for UTI.  CT abdomen/pelvis without contrast showed stable massive pericardial effusion up  to 5.1 cm in thickness, stable cardiomegaly with dense calcifications of mitral and aortic valves.  Distal colonic diverticulosis without diverticulitis noted.  An indeterminate 1.5 cm hyperdense left renal lesion also seen, new since prior study.  Patient was given IV Protonix  80 mg, 500 mLs normal saline.  EDP discussed with on-call for [facility], Dr. Stacia, who recommended medical admission and their team will see in consultation.  The hospitalist service was consulted for admission.  Review of Systems: All systems reviewed and are negative except as documented in history of present illness above.   Past Medical History:  Diagnosis Date   Anemia    Angiodysplasia of stomach and duodenum    Aortic stenosis    Arthus phenomenon    AVM (arteriovenous malformation)    Blood transfusion without reported diagnosis    Breast cancer (HCC) 1989   Left   Candida esophagitis (HCC)    Cataract    Chronic respiratory failure (HCC)    CKD stage 3b, GFR 30-44 ml/min (HCC)    Corneal edema    Corneal epithelial basement membrane dystrophy    CREST syndrome (HCC)    GERD (gastroesophageal reflux disease)    w/ HH   Interstitial lung disease (HCC)    Nodule of kidney    Pericardial effusion    PONV (postoperative nausea and vomiting)    Pulmonary embolus (HCC) 2003   Pulmonary hypertension (HCC)    Rectal incontinence    Renal cell carcinoma (HCC)    Scleroderma (HCC)    Tubular adenoma  of colon    Uterine polyp     Past Surgical History:  Procedure Laterality Date   APPENDECTOMY  1962   BREAST LUMPECTOMY  1989   left   CATARACT EXTRACTION, BILATERAL Bilateral 12/2013   COLONOSCOPY WITH PROPOFOL  N/A 04/15/2021   Procedure: COLONOSCOPY WITH PROPOFOL ;  Surgeon: Teressa Toribio SQUIBB, MD;  Location: [facility];  Service: Endoscopy;  Laterality: N/A;   ENTEROSCOPY N/A 01/18/2016   Procedure: ENTEROSCOPY;  Surgeon: Gustav Shila GAILS, MD;  Location: [facility];  Service: Endoscopy;   Laterality: N/A;   ENTEROSCOPY N/A 05/24/2018   Procedure: ENTEROSCOPY;  Surgeon: Charlanne Groom, MD;  Location: [facility];  Service: Endoscopy;  Laterality: N/A;   ENTEROSCOPY N/A 03/29/2021   Procedure: ENTEROSCOPY;  Surgeon: Albertus Gordy HERO, MD;  Location: [facility];  Service: Gastroenterology;  Laterality: N/A;   ENTEROSCOPY N/A 04/13/2021   Procedure: ENTEROSCOPY;  Surgeon: Leigh Elspeth SQUIBB, MD;  Location: [facility];  Service: Gastroenterology;  Laterality: N/A;   ESOPHAGOGASTRODUODENOSCOPY (EGD) WITH PROPOFOL  N/A 12/21/2015   Procedure: ESOPHAGOGASTRODUODENOSCOPY (EGD) WITH PROPOFOL ;  Surgeon: Norleen LOISE Kiang, MD;  [facility]: [facility];  Service: Endoscopy;  Laterality: N/A;   HOT HEMOSTASIS N/A 05/24/2018   Procedure: HOT HEMOSTASIS (ARGON PLASMA COAGULATION/BICAP);  Surgeon: Charlanne Groom, MD;  Location: [facility];  Service: Endoscopy;  Laterality: N/A;   HOT HEMOSTASIS N/A 03/29/2021   Procedure: HOT HEMOSTASIS (ARGON PLASMA COAGULATION/BICAP);  Surgeon: Albertus Gordy HERO, MD;  Location: [facility];  Service: Gastroenterology;  Laterality: N/A;   HOT HEMOSTASIS N/A 04/13/2021   Procedure: HOT HEMOSTASIS (ARGON PLASMA COAGULATION/BICAP);  Surgeon: Leigh Elspeth SQUIBB, MD;  Location: [facility];  Service: Gastroenterology;  Laterality: N/A;   INTRAMEDULLARY (IM) NAIL INTERTROCHANTERIC Left 10/11/2022   Procedure: INTRAMEDULLARY (IM) NAIL INTERTROCHANTERIC;  Surgeon: Melodi Lerner, MD;  Location: [facility];  Service: Orthopedics;  Laterality: Left;   IR GENERIC HISTORICAL  06/05/2016   IR RADIOLOGIST EVAL & MGMT 06/05/2016 Marcey Moan, MD [facility] INTERV RAD   IVC Filter     KIDNEY SURGERY     left -laser surgery by Dr. moan- 5 yrs ago no removal   RIGHT/LEFT HEART CATH AND CORONARY ANGIOGRAPHY N/A 04/25/2022   Procedure: RIGHT/LEFT HEART CATH AND CORONARY ANGIOGRAPHY;  Surgeon: Wonda Sharper, MD;  Location: [facility] INVASIVE CV LAB;  Service: Cardiovascular;  Laterality: N/A;    SCHLEROTHERAPY  05/24/2018   Procedure: WALDEMAR;  Surgeon: Charlanne Groom, MD;  Location: [facility];  Service: Endoscopy;;   SUBMUCOSAL TATTOO INJECTION  04/13/2021   Procedure: SUBMUCOSAL TATTOO INJECTION;  Surgeon: Leigh Elspeth SQUIBB, MD;  Location: [facility];  Service: Gastroenterology;;   TONSILLECTOMY     TUBAL LIGATION      Social History: Social History   Tobacco Use   Smoking status: Never   Smokeless tobacco: Never  Vaping Use   Vaping status: Never Used  Substance Use Topics   Alcohol use: No   Drug use: No   Allergies  Allergen Reactions   Codeine  Nausea Only    Hallucinations, too   Other Nausea And Vomiting and Other (See Comments)    -mycin antibiotics.   Also cause hallucinations.   Erythromycin Nausea And Vomiting   Iodinated Contrast Media Other (See Comments)    Renal issues   Lisinopril Other (See Comments)    Pt doesn't remember reaction   Mirtazapine  Other (See Comments)    Threw me into orbit   Mycophenolate Mofetil Nausea Only    Made me nervous   Sulfa  Antibiotics  Other (See Comments)    Unknown reaction    Family History  Problem Relation Age of Onset   Bladder Cancer Father    Diabetes Father    Prostate cancer Father    Alzheimer's disease Mother    Diabetes Sister    Lung cancer Sister         smoker   Esophageal cancer Paternal Uncle    Colon cancer Neg Hx      Prior to Admission medications   Medication Sig Start Date End Date Taking? Authorizing Provider  acetaminophen  (TYLENOL ) 325 MG tablet Take 2 tablets (650 mg total) by mouth every 6 (six) hours as needed for moderate pain. 07/18/22   Christopher Savannah, PA-C  albuterol  (PROVENTIL ) (2.5 MG/3ML) 0.083% nebulizer solution Take 3 mLs (2.5 mg total) by nebulization every 6 (six) hours as needed for wheezing or shortness of breath. 09/03/23   Pokhrel, Vernal, MD  albuterol  (VENTOLIN  HFA) 108 (90 Base) MCG/ACT inhaler INHALE 2 PUFFS INTO THE LUNGS EVERY 6 HOURS AS NEEDED  FOR WHEEZING OR SHORTNESS OF BREATH 03/13/23   Rollene Almarie LABOR, MD  ALPRAZolam  (XANAX ) 0.25 MG tablet Take 1 tablet (0.25 mg total) by mouth at bedtime as needed for anxiety. 06/28/24   Rollene Almarie LABOR, MD  arformoterol  (BROVANA ) 15 MCG/2ML NEBU USE 1 VIAL IN NEBULIZER TWICE DAILY 08/15/24   Hunsucker, Donnice SAUNDERS, MD  augmented betamethasone dipropionate (DIPROLENE-AF) 0.05 % cream Apply 1 application  topically 2 (two) times daily as needed (for irritation). 07/13/23   [provider]  clobetasol ointment (TEMOVATE) 0.05 % Apply 1 Application topically 2 (two) times daily as needed (for irritation). 04/20/23   [provider]  diltiazem  (CARDIZEM ) 30 MG tablet Take 1 tablet (30 mg total) by mouth every 12 (twelve) hours. 12/04/23   Emelia Josefa HERO, NP  docusate sodium  (COLACE) 100 MG capsule Take 100 mg by mouth daily.    [provider]  famotidine  (PEPCID ) 20 MG tablet TAKE 1 TABLET BY MOUTH AT BEDTIME 09/14/24   Nandigam, Kavitha V, MD  fluticasone  (FLONASE ) 50 MCG/ACT nasal spray USE 1 SPRAY(S) IN EACH NOSTRIL ONCE DAILY AS NEEDED FOR ALLERGIES 12/04/23   Rollene Almarie LABOR, MD  furosemide  (LASIX ) 20 MG tablet Take 2 tablets ( 40 mg ) every morning may take extra 2 tablets for weight gain of 3 lbs 02/19/24   Pietro Redell RAMAN, MD  ipratropium (ATROVENT ) 0.02 % nebulizer solution Take 2.5 mLs (0.5 mg total) by nebulization 2 (two) times daily. 06/06/24   Hunsucker, Donnice SAUNDERS, MD  latanoprost  (XALATAN ) 0.005 % ophthalmic solution Place 1 drop into both eyes at bedtime. 09/23/22   [provider]  montelukast  (SINGULAIR ) 10 MG tablet Take 1 tablet (10 mg total) by mouth at bedtime. 08/10/24   Rollene Almarie LABOR, MD  Multiple Vitamin (MULTIVITAMIN WITH MINERALS) TABS tablet Take 1 tablet by mouth daily.    [provider]  Nutritional Supplements (ENSURE HIGH PROTEIN) LIQD Take 0.5 Bottles by mouth daily as needed (for supplementation).    [provider]  OXYGEN  Inhale 6 L/min into the lungs continuous.    [provider]  pantoprazole  (PROTONIX ) 40 MG tablet TAKE 1 TABLET BY MOUTH TWICE DAILY BEFORE A MEAL 09/12/24   Nandigam, Kavitha V, MD  spironolactone  (ALDACTONE ) 25 MG tablet Take 1/2 tablet ( 12.5 mg ) every morning 02/18/24   Pietro Redell RAMAN, MD  sucralfate  (CARAFATE ) 1 GM/10ML suspension TAKE 10 MLS BY MOUTH EVERY 6 HOURS  AS NEEDED FOR REFLUX 04/18/24   Nandigam, Kavitha V, MD  triamcinolone  (KENALOG ) 0.1 % paste Use as directed 1 Application in the mouth or throat 2 (two) times daily. 06/28/24   Rollene Almarie LABOR, MD  triamcinolone  cream (KENALOG ) 0.1 % Apply 1 Application topically See admin instructions. Apply to affected leg(s) after showers and up to 2 additional times a day as needed for irritation 07/07/23   [provider]    Physical Exam: Vitals:   09/25/24 1947 09/25/24 2000 09/25/24 2100 09/25/24 2224  BP:  (!) 142/62 (!) 128/54   Pulse:  88 84   Resp:  (!) 22 17   Temp: 98 F (36.7 C)   98.7 F (37.1 C)  TempSrc: Oral   Oral  SpO2:  100% 100%   Weight:      Height:       Constitutional: Thin elderly woman resting in bed with head elevated.  NAD, calm, comfortable Eyes: EOMI, lids and conjunctivae normal ENMT: Mucous membranes are moist. Posterior pharynx clear of any exudate or lesions.Normal dentition.  Neck: normal, supple, no masses. Respiratory: Fine inspiratory crackles throughout the lung fields. Normal respiratory effort while on 4 L supplemental O2 via [place]. No accessory muscle use.  Cardiovascular: Regular rate and rhythm, systolic murmur. No extremity edema. 2+ pedal pulses. Abdomen: Soft, no tenderness, no masses palpated. Musculoskeletal: no clubbing / cyanosis. No joint deformity upper and lower extremities. Good ROM, no contractures. Normal muscle tone.  Skin: no rashes, lesions, ulcers. No induration Neurologic: Sensation intact. Strength 5/5 in all 4.  Psychiatric:  Normal judgment and insight. Alert and oriented x 3.  Somewhat anxious mood.   EKG: Personally reviewed. Sinus rhythm, rate 77, first-degree AV block, QTc 486.  PR interval is more prolonged when compared to previous.  Assessment/Plan Principal Problem:   Acute on chronic blood loss anemia Active Problems:   Acute kidney injury superimposed on chronic kidney disease   Chronic heart failure with preserved ejection fraction (HFpEF, >= 50%) (HCC)   Chronic respiratory failure with hypoxia (HCC)   Chronic pericardial effusion   ILD (interstitial lung disease) (HCC)   AVM (arteriovenous malformation) of small bowel, acquired   History of pulmonary embolism   PAH (pulmonary artery hypertension) (HCC)   MACAIAH MANGAL is a 88 y.o. female with medical history significant for ILD, chronic hypoxic respiratory failure on 4 L O2 [place], chronic HFpEF, PAH, chronic large pericardial effusion, moderate aortic stenosis, anemia of chronic disease and B12 deficiency, history of GI bleed due to gastric/small bowel AVMs, history of PE not on AC s/p IVC filter, CKD stage IIIb, CTD/scleroderma, renal cell carcinoma who is admitted with acute on chronic blood loss anemia due to GI bleed.  Assessment and Plan: Acute on chronic blood loss anemia suspected due to GI bleed from AVMs: Hgb 6.7 on admission compared to 10.5 on 09/13/2024.  BUN 99.  CT A/P shows diverticulosis without diverticulitis.  FOBT was positive.  She has a known history of gastric and small bowel AVMs which is suspected source of bleeding again. - Obtain type and screen - Transfuse 1 unit PRBC - Continue IV Protonix  40 mg twice daily - Repeat CBC in a.m. - GI to consult, will keep NPO after midnight  Acute kidney injury superimposed on CKD stage IIIb: Creatinine 2.01 on admission compared to baseline ~1.5-1.6.  Suspect secondary to acute blood loss anemia. - Transfuse 1 unit PRBC as above - Obtain renal ultrasound - Hold spironolactone  for  now  Chronic HFpEF (EF 70-75%) Chronic large pericardial effusion Moderate aortic stenosis: No sign of decompensated CHF.  CT A/P again shows stable massive pericardial effusion.  No evidence of tamponade. - Holding spironolactone  - Resume home Lasix  40 mg daily in a.m. - Strict I/O's and daily weights  Interstitial lung disease Chronic respiratory failure with hypoxia on 4 L O2 [place]: Currently stable on home 4 L O2 via [place].  Continue Brovana /Atrovent , Singulair , and albuterol  as needed.  CTD/scleroderma Pulmonary artery hypertension: Holding diltiazem  for now given risk of hypotension in setting of GI bleed.  History of PE: Not on anticoagulation due to GI bleed history.  IVC filter in place.  Left renal lesion History of renal cell carcinoma s/p cryoablation on the left 2012: CT A/P showed an indeterminate 1.5 cm hyperdense left renal lesion which is new since prior study.  Renal ultrasound ordered as above.  Anxiety: Continue home Xanax  0.25 mg nightly as needed.   DVT prophylaxis: SCDs Start: 09/25/24 2319 Code Status:   Code Status: Limited: Do not attempt resuscitation (DNR) -DNR-LIMITED -Do Not Intubate/DNI discussed with patient on admission Family Communication: Discussed with patient, she has discussed with family Disposition Plan: From home, dispo pending clinical progress Consults called: GI Severity of Illness: The appropriate patient status for this patient is INPATIENT. Inpatient status is judged to be reasonable and necessary in order to provide the required intensity of service to ensure the patient's safety. The patient's presenting symptoms, physical exam findings, and initial radiographic and laboratory data in the context of their chronic comorbidities is felt to place them at high risk for further clinical deterioration. Furthermore, it is not anticipated that the patient will be medically stable for discharge from the hospital within 2 midnights of admission.    * I certify that at the point of admission it is my clinical judgment that the patient will require inpatient hospital care spanning beyond 2 midnights from the point of admission due to high intensity of service, high risk for further deterioration and high frequency of surveillance required.DEWAINE Jorie Blanch MD [facility]  If 7PM-7AM, please contact night-coverage [email or web address]  09/25/2024, 11:30 PM

## 2024-09-25 NOTE — ED Notes (Signed)
 [facility] called for transport.

## 2024-09-26 ENCOUNTER — Inpatient Hospital Stay (HOSPITAL_COMMUNITY): Admitting: Certified Registered"

## 2024-09-26 ENCOUNTER — Encounter (HOSPITAL_COMMUNITY): Admission: EM | Disposition: A | Payer: Self-pay | Source: Home / Self Care | Attending: Internal Medicine

## 2024-09-26 ENCOUNTER — Encounter (HOSPITAL_COMMUNITY): Payer: Self-pay

## 2024-09-26 ENCOUNTER — Encounter: Payer: Self-pay | Admitting: Physician Assistant

## 2024-09-26 ENCOUNTER — Encounter (HOSPITAL_BASED_OUTPATIENT_CLINIC_OR_DEPARTMENT_OTHER): Admitting: Internal Medicine

## 2024-09-26 ENCOUNTER — Encounter: Payer: Self-pay | Admitting: Internal Medicine

## 2024-09-26 DIAGNOSIS — K31811 Angiodysplasia of stomach and duodenum with bleeding: Secondary | ICD-10-CM

## 2024-09-26 DIAGNOSIS — K921 Melena: Secondary | ICD-10-CM

## 2024-09-26 DIAGNOSIS — L8990 Pressure ulcer of unspecified site, unspecified stage: Secondary | ICD-10-CM | POA: Insufficient documentation

## 2024-09-26 DIAGNOSIS — R0902 Hypoxemia: Secondary | ICD-10-CM

## 2024-09-26 DIAGNOSIS — K31819 Angiodysplasia of stomach and duodenum without bleeding: Secondary | ICD-10-CM

## 2024-09-26 DIAGNOSIS — K5520 Angiodysplasia of colon without hemorrhage: Secondary | ICD-10-CM

## 2024-09-26 HISTORY — PX: ENTEROSCOPY: SHX5533

## 2024-09-26 LAB — MRSA NEXT GEN BY PCR, NASAL: MRSA by PCR Next Gen: NOT DETECTED

## 2024-09-26 LAB — BASIC METABOLIC PANEL WITH GFR
Anion gap: 9 (ref 5–15)
BUN: 106 mg/dL — ABNORMAL HIGH (ref 8–23)
CO2: 32 mmol/L (ref 22–32)
Calcium: 8.6 mg/dL — ABNORMAL LOW (ref 8.9–10.3)
Chloride: 97 mmol/L — ABNORMAL LOW (ref 98–111)
Creatinine, Ser: 1.79 mg/dL — ABNORMAL HIGH (ref 0.44–1.00)
GFR, Estimated: 27 mL/min — ABNORMAL LOW (ref >60)
Glucose, Bld: 116 mg/dL — ABNORMAL HIGH (ref 70–99)
Potassium: 4.4 mmol/L (ref 3.5–5.1)
Sodium: 139 mmol/L (ref 135–145)

## 2024-09-26 LAB — CBC
HCT: 20.8 % — ABNORMAL LOW (ref 36.0–46.0)
Hemoglobin: 6.4 g/dL — CL (ref 12.0–15.0)
MCH: 35.2 pg — ABNORMAL HIGH (ref 26.0–34.0)
MCHC: 30.8 g/dL (ref 30.0–36.0)
MCV: 114.3 fL — ABNORMAL HIGH (ref 80.0–100.0)
Platelets: 197 K/uL (ref 150–400)
RBC: 1.82 MIL/uL — ABNORMAL LOW (ref 3.87–5.11)
RDW: 14.1 % (ref 11.5–15.5)
WBC: 10.2 K/uL (ref 4.0–10.5)
nRBC: 0 % (ref 0.0–0.2)

## 2024-09-26 LAB — PREPARE RBC (CROSSMATCH)

## 2024-09-26 LAB — HEMOGLOBIN AND HEMATOCRIT, BLOOD
HCT: 24.5 % — ABNORMAL LOW (ref 36.0–46.0)
HCT: 27.8 % — ABNORMAL LOW (ref 36.0–46.0)
Hemoglobin: 7.7 g/dL — ABNORMAL LOW (ref 12.0–15.0)
Hemoglobin: 8.7 g/dL — ABNORMAL LOW (ref 12.0–15.0)

## 2024-09-26 SURGERY — ENTEROSCOPY
Anesthesia: Monitor Anesthesia Care

## 2024-09-26 MED ORDER — CHLORHEXIDINE GLUCONATE CLOTH 2 % EX PADS
6.0000 | MEDICATED_PAD | Freq: Every day | CUTANEOUS | Status: DC
  Administered 2024-09-26 – 2024-09-28 (×3): 6 via TOPICAL

## 2024-09-26 MED ORDER — PROPOFOL 500 MG/50ML IV EMUL
INTRAVENOUS | Status: DC | PRN
  Administered 2024-09-26: 100 ug/kg/min via INTRAVENOUS

## 2024-09-26 MED ORDER — PROPOFOL 10 MG/ML IV BOLUS
INTRAVENOUS | Status: DC | PRN
  Administered 2024-09-26 (×2): 10 mg via INTRAVENOUS

## 2024-09-26 MED ORDER — ONDANSETRON HCL 4 MG/2ML IJ SOLN
INTRAMUSCULAR | Status: AC
  Filled 2024-09-26: qty 2

## 2024-09-26 MED ORDER — PROPOFOL 10 MG/ML IV BOLUS
INTRAVENOUS | Status: AC
  Filled 2024-09-26: qty 20

## 2024-09-26 MED ORDER — PROPOFOL 500 MG/50ML IV EMUL
INTRAVENOUS | Status: AC
  Filled 2024-09-26: qty 50

## 2024-09-26 MED ORDER — SODIUM CHLORIDE 0.9 % IV SOLN
INTRAVENOUS | Status: AC | PRN
  Administered 2024-09-26: 500 mL via INTRAMUSCULAR

## 2024-09-26 MED ORDER — SODIUM CHLORIDE 0.9 % IV SOLN: INTRAVENOUS | Status: AC

## 2024-09-26 MED ORDER — SODIUM CHLORIDE 0.9% IV SOLUTION: Freq: Once | INTRAVENOUS | Status: AC

## 2024-09-26 NOTE — Plan of Care (Signed)

## 2024-09-26 NOTE — Anesthesia Preprocedure Evaluation (Addendum)
 Anesthesia Evaluation  Patient identified by MRN, date of birth, ID band Patient awake    Reviewed: Allergy & Precautions, NPO status , Patient's Chart, lab work & pertinent test results  History of Anesthesia Complications (+) PONV and history of anesthetic complications  Airway Mallampati: II  TM Distance: >3 FB Neck ROM: Full    Dental  (+) Dental Advisory Given, Missing, Caps,    Pulmonary  H/o PE Interstitial lung disease on inhalers x3 chronic hypoxic respiratory failure on 4-5 L O2 [place]   breath sounds clear to auscultation + decreased breath sounds      Cardiovascular hypertension, Pt. on medications pulmonary hypertension+ Valvular Problems/Murmurs AS  Rhythm:Regular Rate:Normal + Systolic murmurs S/p IVC filter  Echo 12/03/23: 1. Left ventricular ejection fraction, by estimation, is 70 to 75%. The  left ventricle has hyperdynamic function. The left ventricle has no  regional wall motion abnormalities. There is severe concentric left  ventricular hypertrophy. Left ventricular  diastolic parameters are consistent with Grade I diastolic dysfunction  (impaired relaxation). Elevated left ventricular end-diastolic pressure.   2. Right ventricular systolic function is normal. The right ventricular  size is normal.   3. There effusion measures 5.06cm at largest diameter posteriorly and  2.29cm anteriorly.. Large pericardial effusion. The pericardial effusion  is circumferential.   4. The mitral valve is normal in structure. Mild mitral valve  regurgitation. Mild to moderate mitral stenosis. The mean mitral valve  gradient is 6.0 mmHg.   5. Tricuspid valve regurgitation is moderate.   6. The aortic valve is tricuspid. There is moderate calcification of the  aortic valve. There is moderate thickening of the aortic valve. Aortic  valve regurgitation is not visualized. Moderate aortic valve stenosis.  Aortic valve area, by VTI  measures  1.17 cm. Aortic valve mean gradient measures 21.0 mmHg. Aortic valve Vmax  measures 3.14 m/s.   7. Compared to study dated 01/15/23 the pericardial effusion has increased  in diameter posteriorly (4cm prior andnow 5cm) and there is now a 40mm  respirophasic change in MV inflow velocity (2mm on prior echo). There is  no overt diastolic RV collapse, but   both the RV and LV are very underfilled. There is also now moderate AS.   8. Case discussed with Dr. Pietro who recommended patient come in to  office 2/14 for DOD visit if asymptomatic today.      Neuro/Psych negative neurological ROS     GI/Hepatic Neg liver ROS, hiatal hernia,GERD  Medicated,,GI Bleed, history of SB AVM   Endo/Other  negative endocrine ROS    Renal/GU Renal InsufficiencyRenal disease (Renal cell carcinoma)     Musculoskeletal  (+) Arthritis ,  Scleroderma,  CREST syndrome   Abdominal   Peds  Hematology negative hematology ROS (+)   Anesthesia Other Findings   Reproductive/Obstetrics                              Anesthesia Physical Anesthesia Plan  ASA: 4  Anesthesia Plan: MAC   Post-op Pain Management: Minimal or no pain anticipated   Induction: Intravenous  PONV Risk Score and Plan: 3 and TIVA and Treatment may vary due to age or medical condition  Airway Management Planned: Natural Airway and Simple Face Mask  Additional Equipment:   Intra-op Plan:   Post-operative Plan:   Informed Consent: I have reviewed the patients History and Physical, chart, labs and discussed the procedure including the risks, benefits  and alternatives for the proposed anesthesia with the patient or authorized representative who has indicated his/her understanding and acceptance.   Patient has DNR.  Discussed DNR with patient and Suspend DNR.   Dental advisory given  Plan Discussed with: CRNA  Anesthesia Plan Comments:          Anesthesia Quick Evaluation

## 2024-09-26 NOTE — Consult Note (Addendum)
 Consultation  Referring Provider:     Dr. Cheryle Primary Care Physician:  Rollene Almarie LABOR, MD Primary Gastroenterologist: Dr. Shila        Reason for Consultation: GI bleed            HPI:   Paula Ward is a 88 y.o. female with a past medical history as listed below including ILD, chronic hypoxic respiratory failure on 4 L O2 nasal cannula, chronic heart failure preserved ejection fraction, PAH, chronic large pericardial effusion, moderate aortic stenosis, anemia of chronic disease and B12 deficiency, history of GI bleed due to gastric/small bowel AVMs, history of PE not on AC status post IVC filter, CKD stage III, scleroderma, renal cell carcinoma, who presented to the ED on the 09/25/2024 for evaluation of abdominal pain and dark stools.    06/14/2024-06/16/2024 patient admitted to the hospital for acute on chronic heart failure in the setting of COVID.    09/13/2024 patient follow-up with hematology oncology.  At that time iron  panel from 12/30/2022 reviewed without iron  deficiency, her last dose of Retacrit  was 10,000 units every 4 weeks and received 12/30/2022 and she was given again that day.  She had undergone bone marrow biopsy on 08/14/2023 with no clear cause for macrocytosis, though excessive iron  deposition, HFE gene testing was negative in November 2024, at that time recommended monthly Retacrit  and B12 injection.    At time of presentation patient described experiencing pain across her lower abdomen on 09/23/2024 and then starting with dark black appearing stool.  No nausea or vomiting.  Feeling fatigued and getting lightheaded when exerting herself.  Increased shortness of breath with exertion compared to baseline.  Describes similar symptoms in the past for recurrent GI bleed.  Not on blood thinners.    Today, the patient tells me has been a while since she has had any issues with GI bleeding as she follows regularly with hematology/ oncology and they seem to be able to keep  her numbers up with B12 infusions and Retacrit . Last infusion 09/13/24.  Most recently though on Friday, 09/23/2024 she started with melena 3-4 stools a day which continued into Sunday.  These have continued overnight here as well.  Tells me she started to feel very weak and had increased short of breath from her baseline already requiring 3 L O2.  Also somewhat dizzy when she would stand up and could hardly get dressed on Sunday prior to coming to the hospital.  Associate symptoms include some lower abdominal pain she experienced prior to all of this.    Tells me her husband is living in an assisted living facility and she tries to go see him every day but it is very taxing on her as well.    Denies fever, chills, weight loss, nausea, vomiting or symptoms that awaken her from sleep.  ED course: Vitals BP 107/41, pulse 83, RR 20, temp 97.5, SpO2 100% on room air; labs with a hemoglobin of 6.7, MCV 112.4, platelets 208, WBC 7.9, sodium 140, potassium 4.4, bicarb 30, BUN 99, creatinine 2.01, glucose 137, LFTs normal, lipase 50, lactic acid 2.3--> 1.6, FOBT positive, UA negative for CTA; CTAP without contrast showed stable massive pericardial effusion up to 5.1 cm in thickness, stable cardiomegaly with dense calcifications of mitral and aortic valves, distal colonic diverticulosis without diverticulitis and an indeterminate 1.5 cm hyperdense left renal lesion (new since prior study)-given IV Protonix  80 mg  GI history: 02/10/23 office visit with Dr. Nandigam and  discussed history of small bowel AVM with chronic anemia, followed by [facility] for pulmonary hypertension and ILD on O2, discussed that she had been doing better on 07/22/2022 hemoglobin improved to 12 over the Depo injection and IV iron  infusion; plan-discussed she had chronic iron  deficiency anemia due to chronic intermittent occult GI blood loss getting erythropoietin  injections and IV iron  infusions, insurance declined long-acting octreotide,  continued on Pantoprazole  40 mg in the morning and 20 mg in the evening as well as Carafate  for breakthrough, chronic idiopathic constipation and fecal incontinence due to weak anal sphincter continued on MiraLAX  04/15/2021 colonoscopy with diverticulosis in the left colon, otherwise normal, no polyps cancers or AVMs 04/13/2021 enteroscopy with normal esophagus, small hiatal hernia, single nonbleeding angiodysplasia dysplastic lesion in the stomach treated with APC, multiple nonbleeding angiodysplastic lesions in the duodenum treated with APC, distant extent reached the proximal jejunum was tattooed, overall no active bleeding but numerous AVMs suspect she has additional AVMs in the further small bowel and possibly colon, another 8 AVMs treated in the proximal small bowel on this exam and single small AVM in the stomach  Past Medical History:  Diagnosis Date   Anemia    Angiodysplasia of stomach and duodenum    Aortic stenosis    Arthus phenomenon    AVM (arteriovenous malformation)    Blood transfusion without reported diagnosis    Breast cancer (HCC) 1989   Left   Candida esophagitis (HCC)    Cataract    Chronic respiratory failure (HCC)    CKD stage 3b, GFR 30-44 ml/min (HCC)    Corneal edema    Corneal epithelial basement membrane dystrophy    CREST syndrome (HCC)    GERD (gastroesophageal reflux disease)    w/ HH   Interstitial lung disease (HCC)    Nodule of kidney    Pericardial effusion    PONV (postoperative nausea and vomiting)    Pulmonary embolus (HCC) 2003   Pulmonary hypertension (HCC)    Rectal incontinence    Renal cell carcinoma (HCC)    Scleroderma (HCC)    Tubular adenoma of colon    Uterine polyp     Past Surgical History:  Procedure Laterality Date   APPENDECTOMY  1962   BREAST LUMPECTOMY  1989   left   CATARACT EXTRACTION, BILATERAL Bilateral 12/2013   COLONOSCOPY WITH PROPOFOL  N/A 04/15/2021   Procedure: COLONOSCOPY WITH PROPOFOL ;  Surgeon: Teressa Toribio SQUIBB, MD;  Location: [facility];  Service: Endoscopy;  Laterality: N/A;   ENTEROSCOPY N/A 01/18/2016   Procedure: ENTEROSCOPY;  Surgeon: Gustav Shila GAILS, MD;  Location: [facility];  Service: Endoscopy;  Laterality: N/A;   ENTEROSCOPY N/A 05/24/2018   Procedure: ENTEROSCOPY;  Surgeon: Charlanne Groom, MD;  Location: [facility];  Service: Endoscopy;  Laterality: N/A;   ENTEROSCOPY N/A 03/29/2021   Procedure: ENTEROSCOPY;  Surgeon: Albertus Gordy HERO, MD;  Location: [facility];  Service: Gastroenterology;  Laterality: N/A;   ENTEROSCOPY N/A 04/13/2021   Procedure: ENTEROSCOPY;  Surgeon: Leigh Elspeth SQUIBB, MD;  Location: [facility];  Service: Gastroenterology;  Laterality: N/A;   ESOPHAGOGASTRODUODENOSCOPY (EGD) WITH PROPOFOL  N/A 12/21/2015   Procedure: ESOPHAGOGASTRODUODENOSCOPY (EGD) WITH PROPOFOL ;  Surgeon: Norleen LOISE Kiang, MD;  Location: [facility];  Service: Endoscopy;  Laterality: N/A;   HOT HEMOSTASIS N/A 05/24/2018   Procedure: HOT HEMOSTASIS (ARGON PLASMA COAGULATION/BICAP);  Surgeon: Charlanne Groom, MD;  Location: [facility];  Service: Endoscopy;  Laterality: N/A;   HOT HEMOSTASIS N/A 03/29/2021   Procedure: HOT HEMOSTASIS (  ARGON PLASMA COAGULATION/BICAP);  Surgeon: Albertus Gordy HERO, MD;  Location: [facility];  Service: Gastroenterology;  Laterality: N/A;   HOT HEMOSTASIS N/A 04/13/2021   Procedure: HOT HEMOSTASIS (ARGON PLASMA COAGULATION/BICAP);  Surgeon: Leigh Elspeth SQUIBB, MD;  Location: [facility];  Service: Gastroenterology;  Laterality: N/A;   INTRAMEDULLARY (IM) NAIL INTERTROCHANTERIC Left 10/11/2022   Procedure: INTRAMEDULLARY (IM) NAIL INTERTROCHANTERIC;  Surgeon: Melodi Lerner, MD;  Location: [facility];  Service: Orthopedics;  Laterality: Left;   IR GENERIC HISTORICAL  06/05/2016   IR RADIOLOGIST EVAL & MGMT 06/05/2016 Marcey Moan, MD [facility] INTERV RAD   IVC Filter     KIDNEY SURGERY     left -laser surgery by Dr. moan- 5 yrs ago no removal   RIGHT/LEFT HEART CATH AND  CORONARY ANGIOGRAPHY N/A 04/25/2022   Procedure: RIGHT/LEFT HEART CATH AND CORONARY ANGIOGRAPHY;  Surgeon: Wonda Sharper, MD;  Location: [facility];  Service: Cardiovascular;  Laterality: N/A;   SCHLEROTHERAPY  05/24/2018   Procedure: WALDEMAR;  Surgeon: Charlanne Groom, MD;  Location: [facility];  Service: Endoscopy;;   SUBMUCOSAL TATTOO INJECTION  04/13/2021   Procedure: SUBMUCOSAL TATTOO INJECTION;  Surgeon: Leigh Elspeth SQUIBB, MD;  Location: [facility];  Service: Gastroenterology;;   TONSILLECTOMY     TUBAL LIGATION      Family History  Problem Relation Age of Onset   Bladder Cancer Father    Diabetes Father    Prostate cancer Father    Alzheimer's disease Mother    Diabetes Sister    Lung cancer Sister         smoker   Esophageal cancer Paternal Uncle    Colon cancer Neg Hx      Social History   Tobacco Use   Smoking status: Never   Smokeless tobacco: Never  Vaping Use   Vaping status: Never Used  Substance Use Topics   Alcohol use: No   Drug use: No    Prior to Admission medications   Medication Sig Start Date End Date Taking? Authorizing Provider  acetaminophen  (TYLENOL ) 325 MG tablet Take 2 tablets (650 mg total) by mouth every 6 (six) hours as needed for moderate pain. 07/18/22   Christopher Savannah, PA-C  albuterol  (PROVENTIL ) (2.5 MG/3ML) 0.083% nebulizer solution Take 3 mLs (2.5 mg total) by nebulization every 6 (six) hours as needed for wheezing or shortness of breath. 09/03/23   Pokhrel, Vernal, MD  albuterol  (VENTOLIN  HFA) 108 (90 Base) MCG/ACT inhaler INHALE 2 PUFFS INTO THE LUNGS EVERY 6 HOURS AS NEEDED FOR WHEEZING OR SHORTNESS OF BREATH 03/13/23   Rollene Almarie LABOR, MD  ALPRAZolam  (XANAX ) 0.25 MG tablet Take 1 tablet (0.25 mg total) by mouth at bedtime as needed for anxiety. 06/28/24   Rollene Almarie LABOR, MD  arformoterol  (BROVANA ) 15 MCG/2ML NEBU USE 1 VIAL IN NEBULIZER TWICE DAILY 08/15/24   Hunsucker, Donnice SAUNDERS, MD  augmented betamethasone  dipropionate (DIPROLENE-AF) 0.05 % cream Apply 1 application  topically 2 (two) times daily as needed (for irritation). 07/13/23   [provider]  clobetasol ointment (TEMOVATE) 0.05 % Apply 1 Application topically 2 (two) times daily as needed (for irritation). 04/20/23   [provider]  diltiazem  (CARDIZEM ) 30 MG tablet Take 1 tablet (30 mg total) by mouth every 12 (twelve) hours. 12/04/23   Emelia Josefa HERO, NP  docusate sodium  (COLACE) 100 MG capsule Take 100 mg by mouth daily.    [provider]  famotidine  (PEPCID ) 20 MG tablet TAKE 1 TABLET BY MOUTH AT BEDTIME  09/14/24   Nandigam, Kavitha V, MD  fluticasone  (FLONASE ) 50 MCG/ACT nasal spray USE 1 SPRAY(S) IN EACH NOSTRIL ONCE DAILY AS NEEDED FOR ALLERGIES 12/04/23   Rollene Almarie LABOR, MD  furosemide  (LASIX ) 20 MG tablet Take 2 tablets ( 40 mg ) every morning may take extra 2 tablets for weight gain of 3 lbs 02/19/24   Pietro Redell RAMAN, MD  ipratropium (ATROVENT ) 0.02 % nebulizer solution Take 2.5 mLs (0.5 mg total) by nebulization 2 (two) times daily. 06/06/24   Hunsucker, Donnice SAUNDERS, MD  latanoprost  (XALATAN ) 0.005 % ophthalmic solution Place 1 drop into both eyes at bedtime. 09/23/22   [provider]  montelukast  (SINGULAIR ) 10 MG tablet Take 1 tablet (10 mg total) by mouth at bedtime. 08/10/24   Rollene Almarie LABOR, MD  Multiple Vitamin (MULTIVITAMIN WITH MINERALS) TABS tablet Take 1 tablet by mouth daily.    [provider]  Nutritional Supplements (ENSURE HIGH PROTEIN) LIQD Take 0.5 Bottles by mouth daily as needed (for supplementation).    [provider]  OXYGEN  Inhale 6 L/min into the lungs continuous.    [provider]  pantoprazole  (PROTONIX ) 40 MG tablet TAKE 1 TABLET BY MOUTH TWICE DAILY BEFORE A MEAL 09/12/24   Nandigam, Kavitha V, MD  spironolactone  (ALDACTONE ) 25 MG tablet Take 1/2 tablet ( 12.5 mg ) every morning 02/18/24   Pietro Redell RAMAN, MD  sucralfate  (CARAFATE )  1 GM/10ML suspension TAKE 10 MLS BY MOUTH EVERY 6 HOURS AS NEEDED FOR REFLUX 04/18/24   Nandigam, Kavitha V, MD  triamcinolone  (KENALOG ) 0.1 % paste Use as directed 1 Application in the mouth or throat 2 (two) times daily. 06/28/24   Rollene Almarie LABOR, MD  triamcinolone  cream (KENALOG ) 0.1 % Apply 1 Application topically See admin instructions. Apply to affected leg(s) after showers and up to 2 additional times a day as needed for irritation 07/07/23   [provider]    Current Facility-Administered Medications  Medication Dose Route Frequency Provider Last Rate Last Admin   0.9 %  sodium chloride  infusion (Manually program via Guardrails IV Fluids)   Intravenous Once Henderly, Britni A, PA-C   Held at 09/25/24 1932   acetaminophen  (TYLENOL ) tablet 650 mg  650 mg Oral Q6H PRN Patel, Vishal R, MD       Or   acetaminophen  (TYLENOL ) suppository 650 mg  650 mg Rectal Q6H PRN Patel, Vishal R, MD       albuterol  (PROVENTIL ) (2.5 MG/3ML) 0.083% nebulizer solution 2.5 mg  2.5 mg Nebulization Q6H PRN Patel, Vishal R, MD       ALPRAZolam  (XANAX ) tablet 0.25 mg  0.25 mg Oral QHS PRN Patel, Vishal R, MD   0.25 mg at 09/25/24 2342   arformoterol  (BROVANA ) nebulizer solution 15 mcg  15 mcg Nebulization BID Patel, Vishal R, MD   15 mcg at 09/26/24 0809   Chlorhexidine  Gluconate Cloth 2 % PADS 6 each  6 each Topical Daily Patel, Vishal R, MD       furosemide  (LASIX ) tablet 40 mg  40 mg Oral Daily Patel, Vishal R, MD       ipratropium (ATROVENT ) nebulizer solution 0.5 mg  0.5 mg Nebulization BID Patel, Vishal R, MD   0.5 mg at 09/26/24 0809   montelukast  (SINGULAIR ) tablet 10 mg  10 mg Oral QHS Patel, Vishal R, MD       ondansetron  (ZOFRAN ) tablet 4 mg  4 mg Oral Q6H PRN Tobie Jorie SAUNDERS, MD  Or   ondansetron  (ZOFRAN ) injection 4 mg  4 mg Intravenous Q6H PRN Tobie Jorie SAUNDERS, MD       pantoprazole  (PROTONIX ) injection 40 mg  40 mg Intravenous Q12H Henderly, Britni A, PA-C   40 mg at 09/26/24 0804    polyethylene glycol (MIRALAX  / GLYCOLAX ) packet 17 g  17 g Oral Daily PRN Tobie Jorie SAUNDERS, MD       sodium chloride  flush (NS) 0.9 % injection 3 mL  3 mL Intravenous Q12H Tobie Jorie SAUNDERS, MD       Facility-Administered Medications Ordered in Other Encounters  Medication Dose Route Frequency Provider Last Rate Last Admin   cyanocobalamin  (VITAMIN B12) injection 1,000 mcg  1,000 mcg Intramuscular Once Thayil, Irene T, PA-C        Allergies as of 09/25/2024 - Review Complete 09/25/2024  Allergen Reaction Noted   Codeine  Nausea Only 08/03/2008   Other Nausea And Vomiting and Other (See Comments) 04/05/2012   Erythromycin Nausea And Vomiting 10/17/2014   Iodinated contrast media Other (See Comments) 04/18/2022   Lisinopril Other (See Comments) 12/14/2014   Mirtazapine  Other (See Comments) 08/13/2023   Mycophenolate mofetil Nausea Only 09/23/2019   Sulfa  antibiotics Other (See Comments) 03/28/2021     Review of Systems:    Constitutional: No weight loss, fever or chills Skin: No rash Cardiovascular: No chest pain Respiratory: +increased SOB Gastrointestinal: See HPI and otherwise negative Genitourinary: No dysuria Neurological: +dizziness Musculoskeletal: No new muscle or joint pain Hematologic: No bruising Psychiatric: No history of depression or anxiety    Physical Exam:  Vital signs in last 24 hours: Temp:  [97.5 F (36.4 C)-98.7 F (37.1 C)] 98 F (36.7 C) (12/08 0739) Pulse Rate:  [75-89] 87 (12/08 0800) Resp:  [17-28] 22 (12/08 0800) BP: (104-142)/(28-62) 120/41 (12/08 0800) SpO2:  [94 %-100 %] 100 % (12/08 0810) Weight:  [44.8 kg-46.4 kg] 46.4 kg (12/08 0339) Last BM Date :  (PTA) General:   Pleasant elderly, chronically ill appearing Caucasian female appears to be in NAD, Well developed, Well nourished, alert and cooperative Head:  Normocephalic and atraumatic. Eyes:   PEERL, EOMI. No icterus. Conjunctiva pink. Ears:  Normal auditory acuity. Neck:  Supple Throat:  Oral cavity and pharynx without inflammation, swelling or lesion. Teeth in good condition. Lungs: Respirations even and unlabored. Lungs clear to auscultation bilaterally.   No wheezes, crackles, or rhonchi. +on O2 via [place] Heart: Normal S1, S2. No MRG. Regular rate and rhythm. No peripheral edema, cyanosis or pallor.  Abdomen:  Soft, nondistended, nontender. No rebound or guarding. Normal bowel sounds. No appreciable masses or hepatomegaly. Rectal:  Not performed.  Msk:  Symmetrical without gross deformities. Peripheral pulses intact.  Extremities:  Without edema, no deformity or joint abnormality.  Neurologic:  Alert and  oriented x4;  grossly normal neurologically.  Skin:   Dry and intact without significant lesions or rashes. Psychiatric: Demonstrates good judgement and reason without abnormal affect or behaviors.  LAB RESULTS: Recent Labs    09/25/24 1518 09/26/24 0003 09/26/24 0655  WBC 7.9 10.2  --   HGB 6.7* 6.4* 7.7*  HCT 21.8* 20.8* 24.5*  PLT 208 197  --    BMET Recent Labs    09/25/24 1518 09/26/24 0003  NA 140 139  K 4.4 4.4  CL 96* 97*  CO2 30 32  GLUCOSE 137* 116*  BUN 99* 106*  CREATININE 2.01* 1.79*  CALCIUM  9.1 8.6*   LFT Recent Labs    09/25/24 1518  PROT 6.7  ALBUMIN 4.2  AST 25  ALT 9  ALKPHOS 85  BILITOT 0.4    STUDIES: CT ABDOMEN PELVIS WO CONTRAST Result Date: 09/25/2024 CLINICAL DATA:  Generalized abdominal pain, dark stools EXAM: CT ABDOMEN AND PELVIS WITHOUT CONTRAST TECHNIQUE: Multidetector CT imaging of the abdomen and pelvis was performed following the standard protocol without IV contrast. Unenhanced CT was performed per clinician order. Lack of IV contrast limits sensitivity and specificity, especially for evaluation of abdominal/pelvic solid viscera. RADIATION DOSE REDUCTION: This exam was performed according to the departmental dose-optimization program which includes automated exposure control, adjustment of the mA and/or kV according  to patient size and/or use of iterative reconstruction technique. COMPARISON:  04/04/2019, 06/15/2024 FINDINGS: Lower chest: There is a massive pericardial effusion, measuring up to 5.1 cm in thickness, unchanged since the previous CT exam. There is dense calcification of the mitral and aortic valves. Stable cardiomegaly. No acute pleural or parenchymal lung disease. Hepatobiliary: Unremarkable unenhanced appearance of the liver and gallbladder. Pancreas: Unremarkable unenhanced appearance. Spleen: Unremarkable unenhanced appearance. Adrenals/Urinary Tract: There are 2 subcentimeter hemorrhagic cyst within the lower pole left kidney. Within the ventral aspect of the mid kidney there is an indeterminate hyperdense lesion measuring 1.2 x 1.5 cm and measuring 52 HU. Further evaluation with ultrasound may be useful to exclude solid lesion if the patient would be a therapy candidate should neoplasm be detected. Chronic scarring within the upper pole left kidney consistent with prior cryoablation. The right kidney is unremarkable. No urinary tract calculi or obstructive uropathy within either kidney. Bladder is minimally distended, without gross abnormality. The adrenals are stable. Stomach/Bowel: No bowel obstruction or ileus. Prior appendectomy per history. Distal colonic diverticulosis without diverticulitis. No bowel wall thickening or inflammatory change. Vascular/Lymphatic: Diffuse aortic atherosclerosis. IVC filter. No pathologic adenopathy. Reproductive: Uterus is atrophic.  No adnexal masses. Other: No free fluid or free intraperitoneal gas. No abdominal wall hernia. Musculoskeletal: There are no acute or destructive bony abnormalities. Reconstructed images demonstrate no additional findings. IMPRESSION: 1. Stable massive pericardial effusion measuring up to 5.1 cm in thickness. 2. Stable cardiomegaly, with dense calcifications of the mitral and aortic valves. 3. Distal colonic diverticulosis without  diverticulitis. 4. Indeterminate 1.5 cm hyperdense left renal lesion, new since prior study. If the patient would be a candidate should renal neoplasm be detected, renal ultrasound may be useful to exclude solid lesion. 5.  Aortic Atherosclerosis (ICD10-I70.0). Electronically Signed   By: Ozell Daring M.D.   On: 09/25/2024 17:13    Impression / Plan:   Impression: 1.  Acute on chronic blood loss anemia: Suspected due to GI bleed from known AVMs, hemoglobin 6.7 on admission (10.5 on 09/13/2024)--> 1 unit PRBCs--> 7.7, patient description of melena over the past 3 to 4 days, BUN 99, CTAP with diverticulosis without diverticulitis, FOBT positive, known history of gastric and small bowel AVMs last treated in 2022, chronically follows with hematology for B12 infusions and Retacrit -the last 09/13/2024; likely known AVMs 2.  AKI superimposed on CKD stage III: Creatinine 2.01 on admission baseline 1.5-1.6, suspected secondary to acute blood loss anemia 3.  Chronic heart failure preserved EF (EF 70-75%): Chronic large pericardial effusion moderate aortic stenosis, no sign of decompensation on Lasix , Spironolactone  on hold 4.  Interstitial disease: With chronic respiratory failure maintained on 4 L of O2 nasal cannula 5.  CTD/scleroderma and pulmonary artery hypertension 6.  History of PE: IVC filter in place 7.  Left renal lesion: History of renal cell carcinoma status  post cryoablation on the left in 2012, new lesion on CT here 8.  Anxiety  Plan: 1.  Discussed enteroscopy with the patient given known history of AVMs throughout the small bowel and new melena in the setting of acute on chronic blood loss anemia and hemoglobin of 6.7.  This will be scheduled with Dr. Abran.  Will try to get her in this afternoon.  Did discuss risks, benefits, limitations and alternatives and the patient agrees to proceed. 2.  Patient to remain n.p.o. until after time of procedure 3.  Agree with IV PPI 40 mg twice daily 4.   Continue to monitor hemoglobin, transfuse as needed less than 7/8 (per patient she does better when her hemoglobin is above 8) 5.  Please await further recommendations from Dr. Abran later today.  Thank you for your kind consultation, we will continue to follow.  Delon Gibson Lemmon  09/26/2024, 9:18 AM  GI ATTENDING  History, laboratories, x-rays, multiple prior endoscopy reports all personally reviewed.  Patient seen and examined personally.  88 year old with multiple significant medical problems who presents with acute on chronic anemia with suggestions of upper GI bleeding.  Has a history of gastric and small bowel AVMs treated endoscopically.  Suspicious of the same.  She is high risk given her age and comorbidities.  She understands this.The nature of the procedure, as well as the risks, benefits, and alternatives were carefully and thoroughly reviewed with the patient. Ample time for discussion and questions allowed. The patient understood, was satisfied, and agreed to proceed.  85 minutes was spent preparing to see this patient, reviewing data, obtaining history, performing physical exam, counseling and educating her regarding the above listed issues, ordering endoscopy, and documenting clinical information in the health record.  The degree of medical decision making for this case is deemed highest degree.  Norleen SAILOR. Abran Raddle., M.D. [facility] Division of Gastroenterology

## 2024-09-26 NOTE — Transfer of Care (Signed)
 Immediate Anesthesia Transfer of Care Note  Patient: Paula Ward  Procedure(s) Performed: ENTEROSCOPY  Patient Location: PACU  Anesthesia Type:MAC  Level of Consciousness: drowsy  Airway & Oxygen  Therapy: Patient Spontanous Breathing and Patient connected to face mask oxygen   Post-op Assessment: Report given to RN, Post -op Vital signs reviewed and stable, and Patient moving all extremities X 4  Post vital signs: Reviewed and stable  Last Vitals:  Vitals Value Taken Time  BP 137/43   Temp    Pulse 91 09/26/24 14:16  Resp 17 09/26/24 14:16  SpO2 100 % 09/26/24 14:16  Vitals shown include unfiled device data.  Last Pain:  Vitals:   09/26/24 1318  TempSrc: Temporal  PainSc: 0-No pain         Complications: No notable events documented.

## 2024-09-26 NOTE — Op Note (Signed)
 [facility] Patient Name: Paula Ward Procedure Date: 09/26/2024 MRN: [id number] Attending MD: Norleen SAILOR. 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 Abran, MD, Gregoria Pierce, RN, Curtistine Bishop, Technician Referring MD:             [facility] Medicines:                Monitored Anesthesia Care Complications:            No immediate complications. Estimated Blood Loss:     Estimated blood loss: none. Procedure:                Pre-Anesthesia Assessment:                           - Prior to the procedure, a History and Physical                            was performed, and patient medications and                            allergies were reviewed. The patient's tolerance of                            previous anesthesia was also reviewed. The risks                            and benefits of the procedure and the sedation                            options and risks were discussed with the patient.                            All questions were answered, and informed consent                            was obtained. Prior Anticoagulants: The patient has                            taken no anticoagulant or antiplatelet agents. ASA                            Grade Assessment: III - A patient with severe  systemic disease. After reviewing the risks and                            benefits, the patient was deemed in satisfactory                            condition to undergo the procedure.                           After obtaining informed consent, the endoscope was                            passed under direct vision. Throughout the                             procedure, the patient's blood pressure, pulse, and                            oxygen  saturations were monitored continuously. The                            [id number]-H190TL ([id number] Olympus Colonoscope was                            introduced through the mouth and advanced to the                            proximal jejunum. The small bowel enteroscopy was                            accomplished without difficulty. The patient                            tolerated the procedure well. Scope In: Scope Out: Findings:      The esophagus was normal.      The stomach revealed a hiatal hernia but was otherwise normal.      The duodenal bulb was normal      The postbulbar duodenum and proximal jejunum revealed multiple       angiodysplastic lesions with no active bleeding. 6 such lesions were       identified. Coagulation for bleeding prevention using argon plasma (APC)       was successful. Impression:               - Multiple non-bleeding angiodysplastic lesions in                            the duodenum and jejunum. Treated with argon plasma                            coagulation (APC).                           - Otherwise unremarkable enterography. Recommendation:           1. Return to the hospital ward for ongoing care  2. Heart healthy diet                           3. Monitor blood counts and stools                           4. I would recommend transfusing 1 more unit of                            blood for her current hemoglobin of 7.7 given her                            comorbidities                           5. If she is stable overnight, would anticipate                            discharge tomorrow                           6. Ongoing treatment of multifactorial anemia with                            hematology                           7. GI follow-up with Dr. Nandigam as needed                           The procedure was reviewed with the  patient in                            recovery. She was provided a copy of this report. Procedure Code(s):        --- Professional ---                           [phone], Small intestinal endoscopy, enteroscopy                            beyond second portion of duodenum, not including                            ileum; with control of bleeding (eg, injection,                            bipolar cautery, unipolar cautery, laser, heater                            probe, stapler, plasma coagulator) Diagnosis Code(s):        --- Professional ---                           X68.180, Angiodysplasia of stomach and duodenum  without bleeding                           K92.1, Melena (includes Hematochezia) CPT copyright 2022 [employer]. All rights reserved. The codes documented in this report are preliminary and upon coder review may  be revised to meet current compliance requirements. Norleen SAILOR. Abran, MD 09/26/2024 2:32:00 PM This report has been signed electronically. Number of Addenda: 0

## 2024-09-26 NOTE — Anesthesia Procedure Notes (Signed)
 Procedure Name: MAC Date/Time: 09/26/2024 1:46 PM  Performed by: Brandy Almarie BROCKS, CRNAPre-anesthesia Checklist: Patient identified, Emergency Drugs available, Patient being monitored and Suction available Oxygen  Delivery Method: Simple face mask

## 2024-09-26 NOTE — Progress Notes (Signed)
 PROGRESS NOTE    Paula Ward  FMW:[id number] DOB: [birth year] DOA: 09/25/2024 PCP: Rollene Almarie LABOR, MD   Brief Narrative:   88 y.o. female with medical history significant for ILD, chronic hypoxic respiratory failure on 4-5 L O2 [place], chronic diastolic heart failure, pulmonary artery hypertension, chronic large pericardial effusion, moderate aortic stenosis, anemia of chronic disease and B12 deficiency, history of GI bleed due to gastric/small bowel AVMs, history of PE not on anticoagulation s/p IVC filter, CKD stage IIIb, connective tissue disease/scleroderma, renal cell carcinoma presented with abdominal pain and dark stools.  On presentation, hemoglobin was 6.7 with creatinine of 2.01, lactic acid of 2.3 and 1.6.  FOBT was positive.  UA was negative for UTI.  CT abdomen/pelvis without contrast showed stable massive pericardial effusion up to 5.1 cm in thickness, stable cardiomegaly with dense calcifications of mitral and aortic valves. Distal colonic diverticulosis without diverticulitis noted. An indeterminate 1.5 cm hyperdense left renal lesion also seen, new since prior study.  She was started on IV Protonix .  GI was consulted.  She was transfused 1 unit packed red cells.  Assessment & Plan:   Acute on chronic blood loss anemia probably due to upper GI bleed from AVMs - Patient has a known history of gastric and small bowel AVMs.  Presented with hemoglobin of 6.7 compared to 10.5 on 09/13/2024.  Imaging as above. - Received 1 unit packed red cells transfusion on admission.  Monitor H&H.  Hemoglobin 7.7 this morning.  Continue IV Protonix .  Await GI evaluation. -Keep n.p.o. for now  AKI on CKD stage IIIb -Baseline creatinine of 1.5-1.6.  Presented with creatinine of 2.01.  Improving to 1.79 this morning.  Monitor   Chronic diastolic heart failure Chronic large pericardial effusion Moderate aortic stenosis - Currently compensated.  Strict input and output.  Daily weights.  Fluid  restriction.  CT abdomen and pelvis showed stable massive pericardial effusion with no evidence of tamponade.  Outpatient follow-up with cardiology - Continue Lasix .  Spironolactone  on hold.  Interstitial lung disease Chronic respiratory failure with hypoxia - On 4 to 5 L oxygen  via nasal cannula at home.  Respiratory status currently stable.  Continue current nebulized regimen along with oral montelukast .  Outpatient follow-up with pulmonology  Connective Tissue disease/scleroderma Pulmonary artery hypertension - Outpatient follow-up with pulmonary.  Diltiazem  on hold because of risk of hypotension in the setting of GI bleed  History of PE - Not on anticoagulation due to GI bleed history.  IVC filter in place  Left renal lesion History of renal cell carcinoma status post cryoablation on the left in 2012 -CT A/P showed an indeterminate 1.5 cm hyperdense left renal lesion which is new since prior study. -Renal ultrasound pending.  Outpatient follow-up with urology  Anxiety - Continue as needed Xanax   Goals of care - Consult palliative care for goals of care discussion.   DVT prophylaxis: SCDs Code Status: DNR Family Communication: None at bedside Disposition Plan: Status is: Inpatient Remains inpatient appropriate because: Of severity of illness    Consultants: GI.  Consult palliative care  Procedures: None  Antimicrobials: None   Subjective: Patient seen and examined at bedside.  Feels weak.  No chest pain, shortness with, fever or vomiting reported.  Objective: Vitals:   09/26/24 0339 09/26/24 0400 09/26/24 0500 09/26/24 0606  BP:  (!) 105/40 (!) 124/44 (!) 140/56  Pulse:  85 88 83  Resp:  19 (!) 26 19  Temp:    97.9 F (36.6 C)  TempSrc:    Oral  SpO2:  100% 96% 100%  Weight: 46.4 kg     Height:        Intake/Output Summary (Last 24 hours) at 09/26/2024 0738 Last data filed at 09/26/2024 9394 Gross per 24 hour  Intake 831.04 ml  Output --  Net 831.04 ml    Filed Weights   09/25/24 1514 09/26/24 0339  Weight: 44.8 kg 46.4 kg    Examination:  General exam: Appears calm and comfortable.  Chronically ill and deconditioned looking. Respiratory system: Bilateral decreased breath sounds at bases with scattered crackles Cardiovascular system: S1 & S2 heard, Rate controlled Gastrointestinal system: Abdomen is nondistended, soft and nontender. Normal bowel sounds heard. Extremities: No cyanosis, clubbing; bilateral lower extremity edema present Central nervous system: Alert and oriented. No focal neurological deficits. Moving extremities Skin: No rashes, lesions or ulcers Psychiatry: Flat affect.  Not agitated    Data Reviewed: I have personally reviewed following labs and imaging studies  CBC: Recent Labs  Lab 09/25/24 1518 09/26/24 0003 09/26/24 0655  WBC 7.9 10.2  --   NEUTROABS 6.7  --   --   HGB 6.7* 6.4* 7.7*  HCT 21.8* 20.8* 24.5*  MCV 112.4* 114.3*  --   PLT 208 197  --    Basic Metabolic Panel: Recent Labs  Lab 09/25/24 1518 09/26/24 0003  NA 140 139  K 4.4 4.4  CL 96* 97*  CO2 30 32  GLUCOSE 137* 116*  BUN 99* 106*  CREATININE 2.01* 1.79*  CALCIUM  9.1 8.6*   GFR: Estimated Creatinine Clearance: 16.2 mL/min (A) (by C-G formula based on SCr of 1.79 mg/dL (H)). Liver Function Tests: Recent Labs  Lab 09/25/24 1518  AST 25  ALT 9  ALKPHOS 85  BILITOT 0.4  PROT 6.7  ALBUMIN 4.2   Recent Labs  Lab 09/25/24 1518  LIPASE 50   No results for input(s): AMMONIA in the last 168 hours. Coagulation Profile: No results for input(s): INR, PROTIME in the last 168 hours. Cardiac Enzymes: No results for input(s): CKTOTAL, CKMB, CKMBINDEX, TROPONINI in the last 168 hours. BNP (last 3 results) Recent Labs    06/15/24 0003 06/16/24 0444  PROBNP 10,419.0* 12,611.0*   HbA1C: No results for input(s): HGBA1C in the last 72 hours. CBG: No results for input(s): GLUCAP in the last 168  hours. Lipid Profile: No results for input(s): CHOL, HDL, LDLCALC, TRIG, CHOLHDL, LDLDIRECT in the last 72 hours. Thyroid  Function Tests: No results for input(s): TSH, T4TOTAL, FREET4, T3FREE, THYROIDAB in the last 72 hours. Anemia Panel: No results for input(s): VITAMINB12, FOLATE, FERRITIN, TIBC, IRON , RETICCTPCT in the last 72 hours. Sepsis Labs: Recent Labs  Lab 09/25/24 1632 09/25/24 1838  LATICACIDVEN 2.3* 1.6    Recent Results (from the past 240 hours)  MRSA Next Gen by PCR, Nasal     Status: None   Collection Time: 09/25/24 11:32 PM   Specimen: Nasal Mucosa; Nasal Swab  Result Value Ref Range Status   MRSA by PCR Next Gen NOT DETECTED NOT DETECTED Final    Comment: (NOTE) The GeneXpert MRSA Assay (FDA approved for NASAL specimens only), is one component of a comprehensive MRSA colonization surveillance program. It is not intended to diagnose MRSA infection nor to guide or monitor treatment for MRSA infections. Test performance is not FDA approved in patients less than 55 years old. Performed at [facility], [address] [place], [place] [address]  Radiology Studies: CT ABDOMEN PELVIS WO CONTRAST Result Date: 09/25/2024 CLINICAL DATA:  Generalized abdominal pain, dark stools EXAM: CT ABDOMEN AND PELVIS WITHOUT CONTRAST TECHNIQUE: Multidetector CT imaging of the abdomen and pelvis was performed following the standard protocol without IV contrast. Unenhanced CT was performed per clinician order. Lack of IV contrast limits sensitivity and specificity, especially for evaluation of abdominal/pelvic solid viscera. RADIATION DOSE REDUCTION: This exam was performed according to the departmental dose-optimization program which includes automated exposure control, adjustment of the mA and/or kV according to patient size and/or use of iterative reconstruction technique. COMPARISON:  04/04/2019, 06/15/2024 FINDINGS:  Lower chest: There is a massive pericardial effusion, measuring up to 5.1 cm in thickness, unchanged since the previous CT exam. There is dense calcification of the mitral and aortic valves. Stable cardiomegaly. No acute pleural or parenchymal lung disease. Hepatobiliary: Unremarkable unenhanced appearance of the liver and gallbladder. Pancreas: Unremarkable unenhanced appearance. Spleen: Unremarkable unenhanced appearance. Adrenals/Urinary Tract: There are 2 subcentimeter hemorrhagic cyst within the lower pole left kidney. Within the ventral aspect of the mid kidney there is an indeterminate hyperdense lesion measuring 1.2 x 1.5 cm and measuring 52 HU. Further evaluation with ultrasound may be useful to exclude solid lesion if the patient would be a therapy candidate should neoplasm be detected. Chronic scarring within the upper pole left kidney consistent with prior cryoablation. The right kidney is unremarkable. No urinary tract calculi or obstructive uropathy within either kidney. Bladder is minimally distended, without gross abnormality. The adrenals are stable. Stomach/Bowel: No bowel obstruction or ileus. Prior appendectomy per history. Distal colonic diverticulosis without diverticulitis. No bowel wall thickening or inflammatory change. Vascular/Lymphatic: Diffuse aortic atherosclerosis. IVC filter. No pathologic adenopathy. Reproductive: Uterus is atrophic.  No adnexal masses. Other: No free fluid or free intraperitoneal gas. No abdominal wall hernia. Musculoskeletal: There are no acute or destructive bony abnormalities. Reconstructed images demonstrate no additional findings. IMPRESSION: 1. Stable massive pericardial effusion measuring up to 5.1 cm in thickness. 2. Stable cardiomegaly, with dense calcifications of the mitral and aortic valves. 3. Distal colonic diverticulosis without diverticulitis. 4. Indeterminate 1.5 cm hyperdense left renal lesion, new since prior study. If the patient would be a  candidate should renal neoplasm be detected, renal ultrasound may be useful to exclude solid lesion. 5.  Aortic Atherosclerosis (ICD10-I70.0). Electronically Signed   By: Ozell Daring M.D.   On: 09/25/2024 17:13        Scheduled Meds:  sodium chloride    Intravenous Once   arformoterol   15 mcg Nebulization BID   Chlorhexidine  Gluconate Cloth  6 each Topical Daily   furosemide   40 mg Oral Daily   ipratropium  0.5 mg Nebulization BID   montelukast   10 mg Oral QHS   pantoprazole  (PROTONIX ) IV  40 mg Intravenous Q12H   sodium chloride  flush  3 mL Intravenous Q12H   Continuous Infusions:        Sophie Mao, MD [facility] 09/26/2024, 7:38 AM

## 2024-09-27 ENCOUNTER — Telehealth: Payer: Self-pay

## 2024-09-27 DIAGNOSIS — D649 Anemia, unspecified: Secondary | ICD-10-CM

## 2024-09-27 DIAGNOSIS — K922 Gastrointestinal hemorrhage, unspecified: Principal | ICD-10-CM

## 2024-09-27 LAB — CBC WITH DIFFERENTIAL/PLATELET
Abs Immature Granulocytes: 0.07 K/uL (ref 0.00–0.07)
Basophils Absolute: 0 K/uL (ref 0.0–0.1)
Basophils Relative: 0 %
Eosinophils Absolute: 0.1 K/uL (ref 0.0–0.5)
Eosinophils Relative: 2 %
HCT: 27.6 % — ABNORMAL LOW (ref 36.0–46.0)
Hemoglobin: 8.9 g/dL — ABNORMAL LOW (ref 12.0–15.0)
Immature Granulocytes: 1 %
Lymphocytes Relative: 12 %
Lymphs Abs: 1 K/uL (ref 0.7–4.0)
MCH: 32.7 pg (ref 26.0–34.0)
MCHC: 32.2 g/dL (ref 30.0–36.0)
MCV: 101.5 fL — ABNORMAL HIGH (ref 80.0–100.0)
Monocytes Absolute: 0.5 K/uL (ref 0.1–1.0)
Monocytes Relative: 6 %
Neutro Abs: 7 K/uL (ref 1.7–7.7)
Neutrophils Relative %: 79 %
Platelets: 177 K/uL (ref 150–400)
RBC: 2.72 MIL/uL — ABNORMAL LOW (ref 3.87–5.11)
RDW: 21 % — ABNORMAL HIGH (ref 11.5–15.5)
WBC: 8.8 K/uL (ref 4.0–10.5)
nRBC: 0 % (ref 0.0–0.2)

## 2024-09-27 LAB — BPAM RBC
Blood Product Expiration Date: 202512192359
Blood Product Expiration Date: 202512272359
ISSUE DATE / TIME: 202512080317
ISSUE DATE / TIME: 202512081648
Unit Type and Rh: 6200
Unit Type and Rh: 6200

## 2024-09-27 LAB — TYPE AND SCREEN
ABO/RH(D): A POS
Antibody Screen: NEGATIVE
Unit division: 0
Unit division: 0

## 2024-09-27 LAB — BASIC METABOLIC PANEL WITH GFR
Anion gap: 6 (ref 5–15)
BUN: 90 mg/dL — ABNORMAL HIGH (ref 8–23)
CO2: 35 mmol/L — ABNORMAL HIGH (ref 22–32)
Calcium: 9.2 mg/dL (ref 8.9–10.3)
Chloride: 99 mmol/L (ref 98–111)
Creatinine, Ser: 1.43 mg/dL — ABNORMAL HIGH (ref 0.44–1.00)
GFR, Estimated: 36 mL/min — ABNORMAL LOW (ref >60)
Glucose, Bld: 121 mg/dL — ABNORMAL HIGH (ref 70–99)
Potassium: 4.8 mmol/L (ref 3.5–5.1)
Sodium: 140 mmol/L (ref 135–145)

## 2024-09-27 LAB — MAGNESIUM: Magnesium: 2.1 mg/dL (ref 1.7–2.4)

## 2024-09-27 NOTE — Progress Notes (Addendum)
 Progress Note   Subjective  Hospital day #2 Chief Complaint: GI bleed  Today, patient tells me that she enjoyed her breakfast, oatmeal, some eggs and orange juice.  She is feeling well, she has not had a bowel movement since upper endoscopy with small bowel enteroscopy procedure yesterday.  In fact she has not even been up and out of her bed yet.  I did with her daughter on the phone as well and answered some of her questions in regards to AVMs and chronic bleeding.  Patient tells me that she would still like another unit of blood, but her hemoglobin is above 8 now.  No new complaints or concerns.   Objective   Vital signs in last 24 hours: Temp:  [97.8 F (36.6 C)-99.9 F (37.7 C)] 98.2 F (36.8 C) (12/09 0733) Pulse Rate:  [79-101] 89 (12/09 0800) Resp:  [17-31] 18 (12/09 0800) BP: (114-161)/(36-64) 148/61 (12/09 0800) SpO2:  [90 %-100 %] 100 % (12/09 0800) Weight:  [45.4 kg-47.6 kg] 47.6 kg (12/09 0500) Last BM Date :  (PTA) General:   Elderly white female in NAD Heart:  Regular rate and rhythm; no murmurs Lungs: Respirations even and unlabored, lungs CTA bilaterally + O2 via nasal cannula Abdomen:  Soft, nontender and nondistended. Normal bowel sounds. Psych:  Cooperative. Normal mood and affect.  Intake/Output from previous day: 12/08 0701 - 12/09 0700 In: 546.5 [I.V.:156.5; Blood:390] Out: -    Lab Results: Recent Labs    09/25/24 1518 09/26/24 0003 09/26/24 0655 09/26/24 2113 09/27/24 0322  WBC 7.9 10.2  --   --  8.8  HGB 6.7* 6.4* 7.7* 8.7* 8.9*  HCT 21.8* 20.8* 24.5* 27.8* 27.6*  PLT 208 197  --   --  177   BMET Recent Labs    09/25/24 1518 09/26/24 0003 09/27/24 0322  NA 140 139 140  K 4.4 4.4 4.8  CL 96* 97* 99  CO2 30 32 35*  GLUCOSE 137* 116* 121*  BUN 99* 106* 90*  CREATININE 2.01* 1.79* 1.43*  CALCIUM  9.1 8.6* 9.2   LFT Recent Labs    09/25/24 1518  PROT 6.7  ALBUMIN 4.2  AST 25  ALT 9  ALKPHOS 85  BILITOT 0.4    Studies/Results: US  RENAL Result Date: 09/26/2024 CLINICAL DATA:  Acute kidney injury superimposed on chronic kidney disease. 1.5 cm indeterminate hyperdense lesion in the mid left kidney on a recent abdomen and pelvis CT. EXAM: RENAL / URINARY TRACT ULTRASOUND COMPLETE COMPARISON:  Abdomen and pelvis CT dated 09/25/2024. Limited right upper quadrant abdomen ultrasound dated 03/09/2024. FINDINGS: Right Kidney: Renal measurements: 8.8 x 3.9 x 3.6 cm = volume: 64 mL. Mildly diffusely echogenic. 8 mm simple cyst not needing imaging follow-up. No hydronephrosis. Left Kidney: Renal measurements: 9.2 x 4.4 x 3.9 cm = volume: 82 mL. Mildly diffusely echogenic. 1.3 and 0.9 cm simple cysts not requiring imaging follow-up. The larger of these corresponds to the 1.5 cm indeterminate hyperdense lesion seen on the recent CT. Additional 1.1 cm oval, circumscribed, hypoechoic mass without internal blood flow with color Doppler. This corresponds to 1 of the hemorrhagic cysts on the recent CT. This also does not require imaging follow-up Bladder: Appears normal for degree of bladder distention. Other: None. IMPRESSION: 1. Mildly diffusely echogenic kidneys, consistent with medical renal disease. 2. No hydronephrosis. 3. Bilateral renal cysts not requiring imaging follow-up. Electronically Signed   By: Elspeth Bathe M.D.   On: 09/26/2024 11:43   CT ABDOMEN PELVIS  WO CONTRAST Result Date: 09/25/2024 CLINICAL DATA:  Generalized abdominal pain, dark stools EXAM: CT ABDOMEN AND PELVIS WITHOUT CONTRAST TECHNIQUE: Multidetector CT imaging of the abdomen and pelvis was performed following the standard protocol without IV contrast. Unenhanced CT was performed per clinician order. Lack of IV contrast limits sensitivity and specificity, especially for evaluation of abdominal/pelvic solid viscera. RADIATION DOSE REDUCTION: This exam was performed according to the departmental dose-optimization program which includes automated exposure  control, adjustment of the mA and/or kV according to patient size and/or use of iterative reconstruction technique. COMPARISON:  04/04/2019, 06/15/2024 FINDINGS: Lower chest: There is a massive pericardial effusion, measuring up to 5.1 cm in thickness, unchanged since the previous CT exam. There is dense calcification of the mitral and aortic valves. Stable cardiomegaly. No acute pleural or parenchymal lung disease. Hepatobiliary: Unremarkable unenhanced appearance of the liver and gallbladder. Pancreas: Unremarkable unenhanced appearance. Spleen: Unremarkable unenhanced appearance. Adrenals/Urinary Tract: There are 2 subcentimeter hemorrhagic cyst within the lower pole left kidney. Within the ventral aspect of the mid kidney there is an indeterminate hyperdense lesion measuring 1.2 x 1.5 cm and measuring 52 HU. Further evaluation with ultrasound may be useful to exclude solid lesion if the patient would be a therapy candidate should neoplasm be detected. Chronic scarring within the upper pole left kidney consistent with prior cryoablation. The right kidney is unremarkable. No urinary tract calculi or obstructive uropathy within either kidney. Bladder is minimally distended, without gross abnormality. The adrenals are stable. Stomach/Bowel: No bowel obstruction or ileus. Prior appendectomy per history. Distal colonic diverticulosis without diverticulitis. No bowel wall thickening or inflammatory change. Vascular/Lymphatic: Diffuse aortic atherosclerosis. IVC filter. No pathologic adenopathy. Reproductive: Uterus is atrophic.  No adnexal masses. Other: No free fluid or free intraperitoneal gas. No abdominal wall hernia. Musculoskeletal: There are no acute or destructive bony abnormalities. Reconstructed images demonstrate no additional findings. IMPRESSION: 1. Stable massive pericardial effusion measuring up to 5.1 cm in thickness. 2. Stable cardiomegaly, with dense calcifications of the mitral and aortic valves. 3.  Distal colonic diverticulosis without diverticulitis. 4. Indeterminate 1.5 cm hyperdense left renal lesion, new since prior study. If the patient would be a candidate should renal neoplasm be detected, renal ultrasound may be useful to exclude solid lesion. 5.  Aortic Atherosclerosis (ICD10-I70.0). Electronically Signed   By: Ozell Daring M.D.   On: 09/25/2024 17:13   Enteroscopy 09/26/2024 Findings:      The esophagus was normal.      The stomach revealed a hiatal hernia but was otherwise normal.      The duodenal bulb was normal      The postbulbar duodenum and proximal jejunum revealed multiple       angiodysplastic lesions with no active bleeding. 6 such lesions were       identified. Coagulation for bleeding prevention using argon plasma (APC)       was successful. Impression:               - Multiple non-bleeding angiodysplastic lesions in                            the duodenum and jejunum. Treated with argon plasma                            coagulation (APC).                           -  Otherwise unremarkable enterography. Recommendation:           1. Return to the hospital ward for ongoing care                           2. Heart healthy diet                           3. Monitor blood counts and stools                           4. I would recommend transfusing 1 more unit of                            blood for her current hemoglobin of 7.7 given her                            comorbidities                           5. If she is stable overnight, would anticipate                            discharge tomorrow                           6. Ongoing treatment of multifactorial anemia with                            hematology                           7. GI follow-up with Dr. Nandigam as needed                           The procedure was reviewed with the patient in                            recovery. She was provided a copy of this report.   Assessment / Plan:   Assessment: 1.   Acute on chronic blood loss anemia: Suspected due to GI bleed from known AVMs, hemoglobin 6.7 on admission (10.5 on 09/13/2024)--> 1 unit PRBCs--> 7.7--> 1 unit PRBCs-->8.9 this morning, patient had previously described melena at admission with an elevated BUN, CTAP with diverticulosis without diverticulitis, FOBT positive, chronically follows with hematology for B12 infusions and Retacrit -the last 09/13/2024, enteroscopy yesterday with treatment of duodenal and jejunal AVMs treated with APC, patient has not had a bowel this morning but hemoglobin holding stable 2.  AKI superimposed on CKD stage III: Creatinine now back to baseline at 1.43, initial AKI likely due to blood loss 3.  Chronic heart failure preserved EF (EF 70-75%): Chronic large pericardial effusion moderate aortic stenosis no sign of decompensation 4.  Interstitial lung disease: With chronic respiratory failure maintained on 4 L O2 nasal cannula 5.  CTD/scleroderma on pulmonary artery hypertension 6.  History of PE: IVC filter in place 7.  Left renal lesion: History of renal cell carcinoma status post cryoablation on the left in 2012, new lesion  on CT here 8.  Anxiety  Plan: 1.  Discussed with patient that she responded well to the second unit of blood yesterday.  With her hemoglobin almost 9 we do not need to repeat at this point.  Would continue to trend while she is here with further transfusions if less than 8. 2.  Patient would like to get up today and ambulate to the bathroom etc. and see how she does as she is by herself at home.  She is still in the ICU and will need to be moved to the floor prior to discharge.  Will leave this to the hospital team. 3.  Continue Pantoprazole  40 mg twice daily at discharge. 4.  We will sign off today.  Please call us  back if any further signs of GI bleeding.  She can follow-up as needed in our office.  Would recommend she continue to follow with hematology/ oncology for ongoing hemoglobin checks  given chronic nature of AVM bleeding.  Did discuss this with the patient and her daughter today and answered their questions.  Thank you for your kind consultation.   LOS: 2 days   Delon Hendricks Failing  09/27/2024, 10:23 AM  GI ATTENDING  Interval history and data reviewed.  Patient seen and examined as outlined above.  Agree with comprehensive interval progress note.  As noted, doing well post endoscopy.  No evidence for bleeding.  Hemoglobin with excellent response to transfusion.  Recommendations as above without additional deletion.  Will sign off.  Norleen SAILOR. Abran Raddle., M.D. [facility] Division of Gastroenterology

## 2024-09-27 NOTE — Anesthesia Postprocedure Evaluation (Signed)
 Anesthesia Post Note  Patient: Paula Ward  Procedure(s) Performed: ENTEROSCOPY     Patient location during evaluation: Endoscopy Anesthesia Type: MAC Level of consciousness: oriented, awake and alert and awake Pain management: pain level controlled Vital Signs Assessment: post-procedure vital signs reviewed and stable Respiratory status: spontaneous breathing, nonlabored ventilation, respiratory function stable and patient connected to nasal cannula oxygen  Cardiovascular status: blood pressure returned to baseline and stable Postop Assessment: no headache, no backache and no apparent nausea or vomiting Anesthetic complications: no   No notable events documented.  Last Vitals:    Last Pain:                Garnette FORBES [place]

## 2024-09-27 NOTE — TOC Initial Note (Signed)
 Transition of Care [facility]) - Initial/Assessment Note    Patient Details  Name: Paula Ward MRN: [id number] Date of Birth: 23-Jul-1936  [facility] CM/SW Contact:    Jon ONEIDA Anon, RN Phone Number: 09/27/2024, 2:47 PM  Clinical Narrative:                 Pt is from home. Pt uses 4L O2 at baseline. Pt needing continued medical workup, not medically ready for discharge. PT consulted. Palliative medicine consulted. ICM following for any new recommendations or DC planning needs.   Expected Discharge Plan:  (TBD) Barriers to Discharge: Continued Medical Work up   Patient Goals and CMS Choice Patient states their goals for this hospitalization and ongoing recovery are:: Home CMS Medicare.gov Compare Post Acute Care list provided to:: Patient Choice offered to / list presented to : Patient [place] ownership interest in [facility].provided to:: Patient    Expected Discharge Plan and Services In-house Referral: Hospice / Palliative Care Discharge Planning Services: CM Consult Post Acute Care Choice: Durable Medical Equipment Living arrangements for the past 2 months: Single Family Home                 DME Arranged: N/A DME Agency: NA       HH Arranged: NA HH Agency: NA        Prior Living Arrangements/Services Living arrangements for the past 2 months: Single Family Home Lives with:: Self Patient language and need for interpreter reviewed:: Yes Do you feel safe going back to the place where you live?: Yes      Need for Family Participation in Patient Care: Yes (Comment) Care giver support system in place?: Yes (comment) Current home services: DME Criminal Activity/Legal Involvement Pertinent to Current Situation/Hospitalization: No - Comment as needed  Activities of Daily Living   ADL Screening (condition at time of admission) Independently performs ADLs?: Yes (appropriate for developmental age) Is the patient deaf or have difficulty  hearing?: No Does the patient have difficulty seeing, even when wearing glasses/contacts?: No Does the patient have difficulty concentrating, remembering, or making decisions?: Yes  Permission Sought/Granted Permission sought to share information with : Family Supports    Share Information with NAME: Larita, Deremer (Spouse)  [phone]           Emotional Assessment Appearance:: Other (Comment Required (UTA) Attitude/Demeanor/Rapport: Unable to Assess Affect (typically observed): Unable to Assess Orientation: : Oriented to Self, Oriented to Place, Oriented to  Time, Oriented to Situation Alcohol / Substance Use: Not Applicable Psych Involvement: No (comment)  Admission diagnosis:  Upper GI bleed [K92.2] Symptomatic anemia [D64.9] Acute on chronic blood loss anemia [D62] Patient Active Problem List   Diagnosis Date Noted   Upper GI bleed 09/27/2024   Symptomatic anemia 09/27/2024   Hypoxemia requiring supplemental oxygen  09/26/2024   Melena 09/26/2024   Pressure injury of skin 09/26/2024   Angiodysplasia of duodenum with hemorrhage 09/26/2024   Acute on chronic blood loss anemia 09/25/2024   IPF (idiopathic pulmonary fibrosis) (HCC) 06/14/2024   Oxygen  dependent 06/14/2024   PAH (pulmonary artery hypertension) (HCC) 06/14/2024   Other fatigue 06/14/2024   DNR (do not resuscitate) 03/24/2024   Medication management 03/24/2024   Counseling and coordination of care 03/24/2024   Goals of care, counseling/discussion 03/24/2024   Palliative care encounter 03/24/2024   Pulmonary arterial hypertension (HCC) 09/01/2023   Elevated troponin I level 08/31/2023   Nonrheumatic aortic valve stenosis 08/31/2023   Scleroderma (HCC) 08/31/2023  Chronic pericardial effusion 08/31/2023   Hyponatremia 08/13/2023   Cardiomegaly 08/13/2023   Orthostatic hypotension 05/21/2023   Vitamin B12 deficiency 03/02/2023   Chronic respiratory failure with hypoxia (HCC) 10/10/2022   Adjustment  disorder 10/02/2022   Acute kidney injury superimposed on chronic kidney disease    Generalized weakness 03/29/2021   Chronic heart failure with preserved ejection fraction (HFpEF, >= 50%) (HCC) 03/29/2021   AVM (arteriovenous malformation) of small bowel, acquired    Anemia, chronic disease 12/25/2020   Globus pharyngeus 08/19/2019   Dry mouth 02/15/2018   Allergic rhinitis 11/05/2017   Cerebellar 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 (HCC) 10/26/2017   Pericardial effusion 01/21/2016   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HCC)    Osteoarthritis 02/25/2015   History of pulmonary embolism 01/29/2013   Chronic kidney disease, stage 3b (HCC) 09/01/2012   RECTAL INCONTINENCE 03/27/2009   Angiodysplasia of stomach and duodenum 08/22/2008   ADENOCARCINOMA, BREAST, LEFT 07/26/2007   Essential hypertension 07/26/2007   Other secondary pulmonary hypertension (HCC) 07/26/2007   GASTROESOPHAGEAL REFLUX DISEASE 07/26/2007   ILD (interstitial lung disease) (HCC) 07/26/2007   PCP:  Rollene Almarie LABOR, MD Pharmacy:   [facility] [address] [place] - [address] [address] [place] [address] Phone: [phone] Fax: [phone]     Social Drivers of Health (SDOH) Social History: SDOH Screenings   Food Insecurity: Patient Declined (09/25/2024)  Housing: Unknown (09/25/2024)  Transportation Needs: No Transportation Needs (09/25/2024)  Utilities: Patient Declined (09/25/2024)  Alcohol Screen: Low Risk  (04/02/2022)  Depression (PHQ2-9): Low Risk  (07/22/2024)  Financial Resource Strain: Low Risk  (04/02/2022)  Physical Activity: Inactive (04/02/2022)  Social Connections: Socially Integrated (09/25/2024)  Stress: No Stress Concern Present (04/02/2022)  Tobacco Use: Low Risk  (09/26/2024)   SDOH Interventions:     Readmission Risk Interventions    09/27/2024    2:45 PM 03/24/2024    2:22 PM 10/12/2022    9:05 AM  Readmission Risk Prevention Plan   Transportation Screening Complete Complete Complete  PCP or Specialist Appt within 3-5 Days Complete Complete   HRI or Home Care Consult Complete Complete   Social Work Consult for Recovery Care Planning/Counseling Complete Complete   Palliative Care Screening Complete Complete   Medication Review Oceanographer) Complete Complete Complete  PCP or Specialist appointment within 3-5 days of discharge   Complete  HRI or Home Care Consult   Complete  SW Recovery Care/Counseling Consult   Complete  Palliative Care Screening   Not Applicable  Skilled Nursing Facility   Complete

## 2024-09-27 NOTE — Evaluation (Signed)
 Physical Therapy Evaluation Patient Details Name: Paula Ward MRN: [id number] DOB: 03-04-1936 Today's Date: 09/27/2024  History of Present Illness  88 y.o. female admitted with Acute on chronic blood loss anemia. PMH: ILD, chronic hypoxic respiratory failure on 4-5 L O2 [place], chronic diastolic heart failure, pulmonary artery hypertension, chronic large pericardial effusion, moderate aortic stenosis, anemia of chronic disease and B12 deficiency, history of GI bleed due to gastric/small bowel AVMs, history of PE not on anticoagulation s/p IVC filter, CKD stage IIIb, connective tissue disease/scleroderma, renal cell carcinoma  Clinical Impression  Pt admitted with above diagnosis. PTA, pt reports ind with rollator in the home, brings [facility] into community but family pushes her in transport w/c, has single level home with ramp to enter, ind with self care and has PCA 1-5pm Monday through Friday who assist with household chores, baseline 3-4L O2, reports has family locally who are supportive and assist as needed. On eval, pt demonstrates AROM WFL, strength grossly 3+/5 throughout BLE. Pt powers to stand with BUE assisting, amb with RW with functional cadence, step through gait pattern, decreased bil foot clearance and flat foot posture with forward flexed trunk, on 4L without dyspnea noted or reported, CGA for safety and therapist managing O2 tubing and tank. Anticipate continued progress in acute care, recommended HHPT for safe transition to home but pt politely declines on eval.  Pt currently with functional limitations due to the deficits listed below (see PT Problem List). Pt will benefit from acute skilled PT to increase their independence and safety with mobility to allow discharge.           If plan is discharge home, recommend the following: Assistance with cooking/housework;Assist for transportation;Help with stairs or ramp for entrance   Can travel by private vehicle        Equipment  Recommendations None recommended by PT  Recommendations for Other Services       Functional Status Assessment Patient has had a recent decline in their functional status and demonstrates the ability to make significant improvements in function in a reasonable and predictable amount of time.     Precautions / Restrictions Precautions Precautions: Fall Recall of Precautions/Restrictions: Intact Precaution/Restrictions Comments: baseline 3-4L O2 Restrictions Weight Bearing Restrictions Per Provider Order: No      Mobility  Bed Mobility Overal bed mobility: Needs Assistance Bed Mobility: Supine to Sit     Supine to sit: Supervision, Used rails     General bed mobility comments: supv, using bedrails as needed to upright and slide out to bedside, increased time and effort    Transfers Overall transfer level: Needs assistance Equipment used: Rolling walker (2 wheels) Transfers: Sit to/from Stand Sit to Stand: Contact guard assist           General transfer comment: BUE assisting to slowly power to stand, maintains forward flexed trunk in standing    Ambulation/Gait Ambulation/Gait assistance: Contact guard assist Gait Distance (Feet): 200 Feet Assistive device: Rolling walker (2 wheels) Gait Pattern/deviations: Step-through pattern, Decreased stride length, Trunk flexed Gait velocity: functional     General Gait Details: forward flexed trunk with step through gait pattern and decreased bil foot clearance, reliant on RW for steadying support, no dyspnea noted and pt denies SOB, on 4L with SpO2 97-99%,  Stairs            Wheelchair Mobility     Tilt Bed    Modified Rankin (Stroke Patients Only)       Balance Overall balance  assessment: Mild deficits observed, not formally tested                                           Pertinent Vitals/Pain Pain Assessment Pain Assessment: No/denies pain    Home Living Family/patient expects to  be discharged to:: Private residence Living Arrangements: Alone Available Help at Discharge: Family;Available PRN/intermittently Type of Home: House Home Access: Ramped entrance       Home Layout: One level Home Equipment: Grab bars - tub/shower;Shower seat;Cane - single Librarian, Academic (2 wheels);Rollator (4 wheels);Transport chair Additional Comments: Pt reports PCA 1-5 M-F to assist with household chores; wears 3-4L O2 24/7 at home    Prior Function Prior Level of Function : Independent/Modified Independent;Driving             Mobility Comments: pt reports using rollator my carry all in the home, bring [facility] into community and uses transport chair with family pushing her ADLs Comments: pt reports ind with self care, has PCA 1-5pm M-F, has cleaning services     Extremity/Trunk Assessment   Upper Extremity Assessment Upper Extremity Assessment: Overall WFL for tasks assessed    Lower Extremity Assessment Lower Extremity Assessment: Generalized weakness    Cervical / Trunk Assessment Cervical / Trunk Assessment: Kyphotic  Communication   Communication Communication: No apparent difficulties    Cognition Arousal: Alert Behavior During Therapy: WFL for tasks assessed/performed   PT - Cognitive impairments: No apparent impairments                         Following commands: Intact       Cueing       General Comments General comments (skin integrity, edema, etc.): VSS on 4L    Exercises     Assessment/Plan    PT Assessment Patient needs continued PT services  PT Problem List Decreased activity tolerance;Decreased balance       PT Treatment Interventions DME instruction;Gait training;Functional mobility training;Therapeutic activities;Therapeutic exercise;Balance training;Patient/family education    PT Goals (Current goals can be found in the Care Plan section)  Acute Rehab PT Goals Patient Stated Goal: return home, politely declines  HHPT PT Goal Formulation: With patient Time For Goal Achievement: 10/11/24 Potential to Achieve Goals: Good    Frequency Min 2X/week     Co-evaluation               AM-PAC PT 6 Clicks Mobility  Outcome Measure Help needed turning from your back to your side while in a flat bed without using bedrails?: A Little Help needed moving from lying on your back to sitting on the side of a flat bed without using bedrails?: A Little Help needed moving to and from a bed to a chair (including a wheelchair)?: A Little Help needed standing up from a chair using your arms (e.g., wheelchair or bedside chair)?: A Little Help needed to walk in hospital room?: A Little Help needed climbing 3-5 steps with a railing? : A Little 6 Click Score: 18    End of Session Equipment Utilized During Treatment: Gait belt;Oxygen  Activity Tolerance: Patient tolerated treatment well Patient left: in chair;with call bell/phone within reach Nurse Communication: Mobility status PT Visit Diagnosis: Other abnormalities of gait and mobility (R26.89)    Time: 8568-8542 PT Time Calculation (min) (ACUTE ONLY): 26 min   Charges:   PT Evaluation $PT  Eval Moderate Complexity: 1 Mod PT Treatments $Gait Training: 8-22 mins PT General Charges $$ ACUTE PT VISIT: 1 Visit         Tori Ceria Suminski PT, DPT 09/27/24, 3:50 PM

## 2024-09-27 NOTE — Progress Notes (Signed)
 PROGRESS NOTE    Paula Ward  FMW:[id number] DOB: Apr 18, 1936 DOA: 09/25/2024 PCP: Rollene Almarie LABOR, MD   Brief Narrative:   88 y.o. female with medical history significant for ILD, chronic hypoxic respiratory failure on 4-5 L O2 [place], chronic diastolic heart failure, pulmonary artery hypertension, chronic large pericardial effusion, moderate aortic stenosis, anemia of chronic disease and B12 deficiency, history of GI bleed due to gastric/small bowel AVMs, history of PE not on anticoagulation s/p IVC filter, CKD stage IIIb, connective tissue disease/scleroderma, renal cell carcinoma presented with abdominal pain and dark stools.  On presentation, hemoglobin was 6.7 with creatinine of 2.01, lactic acid of 2.3 and 1.6.  FOBT was positive.  UA was negative for UTI.  CT abdomen/pelvis without contrast showed stable massive pericardial effusion up to 5.1 cm in thickness, stable cardiomegaly with dense calcifications of mitral and aortic valves. Distal colonic diverticulosis without diverticulitis noted. An indeterminate 1.5 cm hyperdense left renal lesion also seen, new since prior study.  She was started on IV Protonix .  GI was consulted.  She was transfused 1 unit packed red cells.  She underwent enteroscopy on 09/26/2024.  Assessment & Plan:   Acute on chronic blood loss anemia probably due to upper GI bleed from AVMs - Patient has a known history of gastric and small bowel AVMs.  Presented with hemoglobin of 6.7 compared to 10.5 on 09/13/2024.  Imaging as above. - Received 2 unit packed red cells transfusion since admission.  Monitor H&H.  Hemoglobin 8.9 this morning.  Continue IV Protonix .  -underwent enteroscopy on 09/26/2024: Multiple nonbleeding angiodysplastic lesions in the duodenum and jejunum seen treated with APC.  Follow further GI recommendations.  AKI on CKD stage IIIb -Baseline creatinine of 1.5-1.6.  Presented with creatinine of 2.01.  Improving to 1.43 this morning.  Monitor    Chronic diastolic heart failure Chronic large pericardial effusion Moderate aortic stenosis - Currently compensated.  Strict input and output.  Daily weights.  Fluid restriction.  CT abdomen and pelvis showed stable massive pericardial effusion with no evidence of tamponade.  Outpatient follow-up with cardiology - Continue Lasix .  Spironolactone  on hold: Will possibly resume on discharge.  Interstitial lung disease Chronic respiratory failure with hypoxia - On 4 to 5 L oxygen  via nasal cannula at home.  Respiratory status currently stable.  Continue current nebulized regimen along with oral montelukast .  Outpatient follow-up with pulmonology  Connective Tissue disease/scleroderma Pulmonary artery hypertension - Outpatient follow-up with pulmonary.  Diltiazem  on hold for now.  Will possibly resume on discharge  History of PE - Not on anticoagulation due to GI bleed history.  IVC filter in place  Left renal lesion History of renal cell carcinoma status post cryoablation on the left in 2012 -CT A/P showed an indeterminate 1.5 cm hyperdense left renal lesion which is new since prior study. -Renal ultrasound showed renal cysts.  Outpatient follow-up with urology  Anxiety - Continue as needed Xanax   Goals of care - palliative care for goals of care discussion is pending.  Physical deconditioning - PT eval   DVT prophylaxis: SCDs Code Status: DNR Family Communication: None at bedside Disposition Plan: Status is: Inpatient Remains inpatient appropriate because: Of severity of illness    Consultants: GI.  palliative care  Procedures: As above  Antimicrobials: None   Subjective: Patient seen and examined at bedside.  Denies any black or bloody stools.  Still feels very weak and has not gotten out of bed yet.  Does not feel ready  to go home today.  No fever or vomiting reported. Objective: Vitals:   09/27/24 0700 09/27/24 0730 09/27/24 0733 09/27/24 0800  BP: (!) 141/53    (!) 148/61  Pulse: 85 85  89  Resp: (!) 22 18  18   Temp:   98.2 F (36.8 C)   TempSrc:   Oral   SpO2: 100% 98%  100%  Weight:      Height:        Intake/Output Summary (Last 24 hours) at 09/27/2024 0945 Last data filed at 09/26/2024 2231 Gross per 24 hour  Intake 543.5 ml  Output --  Net 543.5 ml   Filed Weights   09/26/24 0339 09/26/24 1318 09/27/24 0500  Weight: 46.4 kg 45.4 kg 47.6 kg    Examination:  General: On 4 L oxygen  via nasal cannula.  No distress ENT/neck: No thyromegaly.  JVD is not elevated  respiratory: Decreased breath sounds at bases bilaterally with some crackles; no wheezing  CVS: S1-S2 heard, rate controlled currently Abdominal: Soft, nontender, slightly distended; no organomegaly, bowel sounds are heard Extremities: Trace lower extremity edema; no cyanosis  CNS: Awake and alert.  No focal neurologic deficit.  Moves extremities Lymph: No obvious lymphadenopathy Skin: No obvious ecchymosis/lesions  psych: Affect, judgment and mood are normal  musculoskeletal: No obvious joint swelling/deformity     Data Reviewed: I have personally reviewed following labs and imaging studies  CBC: Recent Labs  Lab 09/25/24 1518 09/26/24 0003 09/26/24 0655 09/26/24 2113 09/27/24 0322  WBC 7.9 10.2  --   --  8.8  NEUTROABS 6.7  --   --   --  7.0  HGB 6.7* 6.4* 7.7* 8.7* 8.9*  HCT 21.8* 20.8* 24.5* 27.8* 27.6*  MCV 112.4* 114.3*  --   --  101.5*  PLT 208 197  --   --  177   Basic Metabolic Panel: Recent Labs  Lab 09/25/24 1518 09/26/24 0003 09/27/24 0322  NA 140 139 140  K 4.4 4.4 4.8  CL 96* 97* 99  CO2 30 32 35*  GLUCOSE 137* 116* 121*  BUN 99* 106* 90*  CREATININE 2.01* 1.79* 1.43*  CALCIUM  9.1 8.6* 9.2  MG  --   --  2.1   GFR: Estimated Creatinine Clearance: 21.2 mL/min (A) (by C-G formula based on SCr of 1.43 mg/dL (H)). Liver Function Tests: Recent Labs  Lab 09/25/24 1518  AST 25  ALT 9  ALKPHOS 85  BILITOT 0.4  PROT 6.7  ALBUMIN  4.2   Recent Labs  Lab 09/25/24 1518  LIPASE 50   No results for input(s): AMMONIA in the last 168 hours. Coagulation Profile: No results for input(s): INR, PROTIME in the last 168 hours. Cardiac Enzymes: No results for input(s): CKTOTAL, CKMB, CKMBINDEX, TROPONINI in the last 168 hours. BNP (last 3 results) Recent Labs    06/15/24 0003 06/16/24 0444  PROBNP 10,419.0* 12,611.0*   HbA1C: No results for input(s): HGBA1C in the last 72 hours. CBG: No results for input(s): GLUCAP in the last 168 hours. Lipid Profile: No results for input(s): CHOL, HDL, LDLCALC, TRIG, CHOLHDL, LDLDIRECT in the last 72 hours. Thyroid  Function Tests: No results for input(s): TSH, T4TOTAL, FREET4, T3FREE, THYROIDAB in the last 72 hours. Anemia Panel: No results for input(s): VITAMINB12, FOLATE, FERRITIN, TIBC, IRON , RETICCTPCT in the last 72 hours. Sepsis Labs: Recent Labs  Lab 09/25/24 1632 09/25/24 1838  LATICACIDVEN 2.3* 1.6    Recent Results (from the past 240 hours)  MRSA Next Gen by  PCR, Nasal     Status: None   Collection Time: 09/25/24 11:32 PM   Specimen: Nasal Mucosa; Nasal Swab  Result Value Ref Range Status   MRSA by PCR Next Gen NOT DETECTED NOT DETECTED Final    Comment: (NOTE) The GeneXpert MRSA Assay (FDA approved for NASAL specimens only), is one component of a comprehensive MRSA colonization surveillance program. It is not intended to diagnose MRSA infection nor to guide or monitor treatment for MRSA infections. Test performance is not FDA approved in patients less than 49 years old. Performed at [facility], [address], [place], [place] [address]          Radiology Studies: US  RENAL Result Date: 09/26/2024 CLINICAL DATA:  Acute kidney injury superimposed on chronic kidney disease. 1.5 cm indeterminate hyperdense lesion in the mid left kidney on a recent abdomen and pelvis CT. EXAM: RENAL  / URINARY TRACT ULTRASOUND COMPLETE COMPARISON:  Abdomen and pelvis CT dated 09/25/2024. Limited right upper quadrant abdomen ultrasound dated 03/09/2024. FINDINGS: Right Kidney: Renal measurements: 8.8 x 3.9 x 3.6 cm = volume: 64 mL. Mildly diffusely echogenic. 8 mm simple cyst not needing imaging follow-up. No hydronephrosis. Left Kidney: Renal measurements: 9.2 x 4.4 x 3.9 cm = volume: 82 mL. Mildly diffusely echogenic. 1.3 and 0.9 cm simple cysts not requiring imaging follow-up. The larger of these corresponds to the 1.5 cm indeterminate hyperdense lesion seen on the recent CT. Additional 1.1 cm oval, circumscribed, hypoechoic mass without internal blood flow with color Doppler. This corresponds to 1 of the hemorrhagic cysts on the recent CT. This also does not require imaging follow-up Bladder: Appears normal for degree of bladder distention. Other: None. IMPRESSION: 1. Mildly diffusely echogenic kidneys, consistent with medical renal disease. 2. No hydronephrosis. 3. Bilateral renal cysts not requiring imaging follow-up. Electronically Signed   By: Elspeth Bathe M.D.   On: 09/26/2024 11:43   CT ABDOMEN PELVIS WO CONTRAST Result Date: 09/25/2024 CLINICAL DATA:  Generalized abdominal pain, dark stools EXAM: CT ABDOMEN AND PELVIS WITHOUT CONTRAST TECHNIQUE: Multidetector CT imaging of the abdomen and pelvis was performed following the standard protocol without IV contrast. Unenhanced CT was performed per clinician order. Lack of IV contrast limits sensitivity and specificity, especially for evaluation of abdominal/pelvic solid viscera. RADIATION DOSE REDUCTION: This exam was performed according to the departmental dose-optimization program which includes automated exposure control, adjustment of the mA and/or kV according to patient size and/or use of iterative reconstruction technique. COMPARISON:  04/04/2019, 06/15/2024 FINDINGS: Lower chest: There is a massive pericardial effusion, measuring up to 5.1 cm in  thickness, unchanged since the previous CT exam. There is dense calcification of the mitral and aortic valves. Stable cardiomegaly. No acute pleural or parenchymal lung disease. Hepatobiliary: Unremarkable unenhanced appearance of the liver and gallbladder. Pancreas: Unremarkable unenhanced appearance. Spleen: Unremarkable unenhanced appearance. Adrenals/Urinary Tract: There are 2 subcentimeter hemorrhagic cyst within the lower pole left kidney. Within the ventral aspect of the mid kidney there is an indeterminate hyperdense lesion measuring 1.2 x 1.5 cm and measuring 52 HU. Further evaluation with ultrasound may be useful to exclude solid lesion if the patient would be a therapy candidate should neoplasm be detected. Chronic scarring within the upper pole left kidney consistent with prior cryoablation. The right kidney is unremarkable. No urinary tract calculi or obstructive uropathy within either kidney. Bladder is minimally distended, without gross abnormality. The adrenals are stable. Stomach/Bowel: No bowel obstruction or ileus. Prior appendectomy per history. Distal colonic diverticulosis  without diverticulitis. No bowel wall thickening or inflammatory change. Vascular/Lymphatic: Diffuse aortic atherosclerosis. IVC filter. No pathologic adenopathy. Reproductive: Uterus is atrophic.  No adnexal masses. Other: No free fluid or free intraperitoneal gas. No abdominal wall hernia. Musculoskeletal: There are no acute or destructive bony abnormalities. Reconstructed images demonstrate no additional findings. IMPRESSION: 1. Stable massive pericardial effusion measuring up to 5.1 cm in thickness. 2. Stable cardiomegaly, with dense calcifications of the mitral and aortic valves. 3. Distal colonic diverticulosis without diverticulitis. 4. Indeterminate 1.5 cm hyperdense left renal lesion, new since prior study. If the patient would be a candidate should renal neoplasm be detected, renal ultrasound may be useful to  exclude solid lesion. 5.  Aortic Atherosclerosis (ICD10-I70.0). Electronically Signed   By: Ozell Daring M.D.   On: 09/25/2024 17:13        Scheduled Meds:  sodium chloride    Intravenous Once   arformoterol   15 mcg Nebulization BID   Chlorhexidine  Gluconate Cloth  6 each Topical Daily   furosemide   40 mg Oral Daily   ipratropium  0.5 mg Nebulization BID   montelukast   10 mg Oral QHS   pantoprazole  (PROTONIX ) IV  40 mg Intravenous Q12H   sodium chloride  flush  3 mL Intravenous Q12H   Continuous Infusions:  sodium chloride             Sophie Mao, MD [facility] 09/27/2024, 9:45 AM

## 2024-09-27 NOTE — Consult Note (Signed)
 Palliative Care Consult Note                                  Date: 09/27/2024   Patient Name: Paula Ward  DOB: [birth year]  MRN: [id number]  Age / Sex: 88 y.o., female  PCP: Rollene Almarie LABOR, MD Referring Physician: Cheryle Page, MD  Reason for Consultation: Establishing goals of care  HPI/Patient Profile: 88 y.o. female  with past medical history of ILD, chronic hypoxic respiratory failure on 3 to 4 L oxygen , chronic HFpEF, pulmonary artery hypertension, moderate aortic stenosis, anemia of chronic disease, chronic large pericardial effusion, history of PE not on anticoagulation s/p IVC filter, CKD stage IIIb, connective tissue disease/scleroderma, renal cell carcinoma, and history of GI bleed due to gastric/small bowel AVMs.  She presented to the ED on 09/25/2024 with dark stools.  On presentation, hemoglobin was 6.7 and FOBT was positive.  She is admitted with acute on chronic blood loss anemia.  She underwent upper endoscopy with with small bowel enteroscopy on 12/8.  Palliative Medicine has been consulted for goals of care discussions and complex medical decision making.   Clinical Assessment and Goals of Care:   Extensive chart review has been completed including labs, vital signs, imaging, progress/consult notes, orders, medications and available advance directive documents. Note hemoglobin is stable at 8.9 today.   I met with patient to discuss diagnosis, prognosis, GOC, disposition, and options.   Patient is known to PMT from previous hospitalizations. I re-introduced Palliative Medicine as specialized medical care for people living with serious illness. It focuses on providing relief from the symptoms and stress of a serious illness.   Created space and opportunity for patient and family to express thoughts and feelings regarding current medical situation. Values and goals of care were attempted to be elicited.  A  discussion was had today regarding advanced directives. We discussed code status and scopes of care. We discussed the difference between full scope versus limited interventions versus comfort care. The MOST form was introduced and discussed.  Life Review: Patient and her husband have been married for 64 years. He recently transitioned to LTC and resides at [facility]. Thy have 1 son, 1 daughter, and 8 grandchildren.   Functional Status: Patient lives alone at home and is independent with ADLs. She continues to drive short distances. She has a caregiver that comes daily 1-5 pm, except for Wednesday and Sunday.   Discussion: Patient is sitting up in the recliner. She tells me she walked around the unit earlier and that she is feeling better.   We discussed patient's current illness and what it means in the larger context of his/her ongoing co-morbidities. Current clinical status was reviewed. Natural disease trajectory of chronic was discussed.  Patient has a very good understanding of her multiple chronic medical conditions. We reviewed these issues including ILD, CKD, heart failure, pulmonary hypertension, pericardial effusion, and chronic GI bleeding due to AVMs.   Patient's goal is for medical stabilization and to return home. She is hopeful to remain as independent as possible, for as long as possible.   Discussed that when she was hospitalized in June, she discharged on home hospice. She rescinded service after she started feeling better (she was not on service very long). She is not interested in hospice services at this time.   Discussed the importance of continued conversation with the medical team regarding overall plan of care and  treatment options. Questions and concerns addressed.   Review of Systems  Constitutional:  Positive for fatigue.    Objective:   Primary Diagnoses: Present on Admission:  Acute on chronic blood loss anemia  Chronic respiratory failure with hypoxia  (HCC)  Chronic pericardial effusion  History of pulmonary embolism  ILD (interstitial lung disease) (HCC)  PAH (pulmonary artery hypertension) (HCC)  AVM (arteriovenous malformation) of small bowel, acquired  Acute kidney injury superimposed on chronic kidney disease  Chronic heart failure with preserved ejection fraction (HFpEF, >= 50%) (HCC)   Physical Exam Vitals reviewed.  Constitutional:      General: She is not in acute distress.    Comments: Chronically ill-appearing Pleasant affect  Cardiovascular:     Rate and Rhythm: Normal rate.  Pulmonary:     Effort: Pulmonary effort is normal.  Neurological:     Mental Status: She is alert and oriented to person, place, and time.     Vital Signs:  BP (!) 130/46   Pulse 86   Temp 98.9 F (37.2 C) (Oral)   Resp (!) 27   Ht 5' 3 (1.6 m)   Wt 47.6 kg   SpO2 100%   BMI 18.59 kg/m   Palliative Assessment/Data: PPS 50%     Assessment & Plan:   SUMMARY OF RECOMMENDATIONS   Continue current scope of care Goal of care is medical stabilization and recovery to the extent possible PMT will continue to follow  Medical Decision Making: Primary decision maker - patient States son and daughter are her HCPOA(s)  Existing Vynca/ACP Documentation: None  Code Status/Advance Care Planning: DNR - Limited  Prognosis:  Unable to determine  Discharge Planning:  Home with Home Health    Thank you for allowing us  to participate in the care of Paula Ward   Time Total: 76 minutes  Detailed review of medical records (labs, imaging, vital signs), medically appropriate exam, discussed with treatment team, counseling and education to patient, family, & staff, documenting clinical information, coordination of care.   Signed by: Recardo Loll, NP Palliative Medicine Team  Team Phone # [phone]  For individual providers, please see AMION

## 2024-09-28 ENCOUNTER — Encounter (HOSPITAL_COMMUNITY): Payer: Self-pay | Admitting: Internal Medicine

## 2024-09-28 DIAGNOSIS — D62 Acute posthemorrhagic anemia: Secondary | ICD-10-CM | POA: Diagnosis not present

## 2024-09-28 LAB — CBC WITH DIFFERENTIAL/PLATELET
Abs Immature Granulocytes: 0.07 K/uL (ref 0.00–0.07)
Basophils Absolute: 0 K/uL (ref 0.0–0.1)
Basophils Relative: 1 %
Eosinophils Absolute: 0.3 K/uL (ref 0.0–0.5)
Eosinophils Relative: 3 %
HCT: 28.6 % — ABNORMAL LOW (ref 36.0–46.0)
Hemoglobin: 8.8 g/dL — ABNORMAL LOW (ref 12.0–15.0)
Immature Granulocytes: 1 %
Lymphocytes Relative: 11 %
Lymphs Abs: 0.9 K/uL (ref 0.7–4.0)
MCH: 32.1 pg (ref 26.0–34.0)
MCHC: 30.8 g/dL (ref 30.0–36.0)
MCV: 104.4 fL — ABNORMAL HIGH (ref 80.0–100.0)
Monocytes Absolute: 0.6 K/uL (ref 0.1–1.0)
Monocytes Relative: 7 %
Neutro Abs: 6.4 K/uL (ref 1.7–7.7)
Neutrophils Relative %: 77 %
Platelets: 172 K/uL (ref 150–400)
RBC: 2.74 MIL/uL — ABNORMAL LOW (ref 3.87–5.11)
RDW: 18.7 % — ABNORMAL HIGH (ref 11.5–15.5)
WBC: 8.3 K/uL (ref 4.0–10.5)
nRBC: 0 % (ref 0.0–0.2)

## 2024-09-28 LAB — MAGNESIUM: Magnesium: 2.1 mg/dL (ref 1.7–2.4)

## 2024-09-28 LAB — BASIC METABOLIC PANEL WITH GFR
Anion gap: 7 (ref 5–15)
BUN: 76 mg/dL — ABNORMAL HIGH (ref 8–23)
CO2: 34 mmol/L — ABNORMAL HIGH (ref 22–32)
Calcium: 8.8 mg/dL — ABNORMAL LOW (ref 8.9–10.3)
Chloride: 97 mmol/L — ABNORMAL LOW (ref 98–111)
Creatinine, Ser: 1.2 mg/dL — ABNORMAL HIGH (ref 0.44–1.00)
GFR, Estimated: 44 mL/min — ABNORMAL LOW (ref >60)
Glucose, Bld: 90 mg/dL (ref 70–99)
Potassium: 4.6 mmol/L (ref 3.5–5.1)
Sodium: 138 mmol/L (ref 135–145)

## 2024-09-28 MED ORDER — PANTOPRAZOLE SODIUM 40 MG PO TBEC: 40.0000 mg | DELAYED_RELEASE_TABLET | Freq: Two times a day (BID) | ORAL | 0 refills | Status: AC

## 2024-09-28 NOTE — TOC Transition Note (Signed)
 Transition of Care [facility]) - Discharge Note   Patient Details  Name: Paula Ward MRN: [id number] Date of Birth: 1936/08/01  Transition [facility] CM/SW Contact:  Toy LITTIE Agar, RN Phone [phone]  09/28/2024, 12:57 PM   Clinical Narrative:    Patient discharging home. Patient refusing home health services.    Final next level of care: Home/Self Care Barriers to Discharge: No Barriers Identified   Patient Goals and CMS Choice Patient states their goals for this hospitalization and ongoing recovery are:: home CMS Medicare.gov Compare Post Acute Care list provided to:: Patient Choice offered to / list presented to : Patient Early ownership interest in [facility].provided to:: Patient    Discharge Placement                       Discharge Plan and Services Additional resources added to the After Visit Summary for   In-house Referral: Hospice / Palliative Care Discharge Planning Services: CM Consult Post Acute Care Choice: Durable Medical Equipment          DME Arranged: N/A DME Agency: NA       HH Arranged: NA (refused) HH Agency: NA        Social Drivers of Health (SDOH) Interventions SDOH Screenings   Food Insecurity: Patient Declined (09/25/2024)  Housing: Unknown (09/25/2024)  Transportation Needs: No Transportation Needs (09/25/2024)  Utilities: Patient Declined (09/25/2024)  Alcohol Screen: Low Risk  (04/02/2022)  Depression (PHQ2-9): Low Risk  (07/22/2024)  Financial Resource Strain: Low Risk  (04/02/2022)  Physical Activity: Inactive (04/02/2022)  Social Connections: Socially Integrated (09/25/2024)  Stress: No Stress Concern Present (04/02/2022)  Tobacco Use: Low Risk  (09/26/2024)     Readmission Risk Interventions    09/27/2024    2:45 PM 03/24/2024    2:22 PM 10/12/2022    9:05 AM  Readmission Risk Prevention Plan  Transportation Screening Complete Complete Complete  PCP or Specialist Appt within 3-5 Days Complete  Complete   HRI or Home Care Consult Complete Complete   Social Work Consult for Recovery Care Planning/Counseling Complete Complete   Palliative Care Screening Complete Complete   Medication Review [employer]) Complete Complete Complete  PCP or Specialist appointment within 3-5 days of discharge   Complete  HRI or Home Care Consult   Complete  SW Recovery Care/Counseling Consult   Complete  Palliative Care Screening   Not Applicable  Skilled Nursing Facility   Complete

## 2024-09-28 NOTE — Plan of Care (Signed)
  Problem: Education: Goal: Knowledge of General Education information will improve Description: Including pain rating scale, medication(s)/side effects and non-pharmacologic comfort measures Outcome: Progressing   Problem: Health Behavior/Discharge Planning: Goal: Ability to manage health-related needs will improve Outcome: Progressing   Problem: Clinical Measurements: Goal: Ability to maintain clinical measurements within normal limits will improve Outcome: Progressing Goal: Will remain free from infection Outcome: Progressing Goal: Diagnostic test results will improve Outcome: Progressing Goal: Respiratory complications will improve Outcome: Progressing Goal: Cardiovascular complication will be avoided Outcome: Progressing   Problem: Activity: Goal: Risk for activity intolerance will decrease Outcome: Progressing   Problem: Nutrition: Goal: Adequate nutrition will be maintained Outcome: Progressing   Problem: Elimination: Goal: Will not experience complications related to bowel motility Outcome: Progressing Goal: Will not experience complications related to urinary retention Outcome: Progressing   Problem: Pain Managment: Goal: General experience of comfort will improve and/or be controlled Outcome: Progressing   Problem: Safety: Goal: Ability to remain free from injury will improve Outcome: Progressing   Problem: Skin Integrity: Goal: Risk for impaired skin integrity will decrease Outcome: Progressing

## 2024-09-28 NOTE — Discharge Summary (Signed)
 Physician Discharge Summary  Paula Ward FMW:[id number] DOB: [birth year] DOA: 09/25/2024  PCP: Rollene Almarie LABOR, MD  Admit date: 09/25/2024 Discharge date: 09/28/2024  Admitted From: Home Disposition: Home  Recommendations for Outpatient Follow-up:  Follow up with PCP in 1 week with repeat CBC/BMP Outpatient follow-up with GI Follow up in ED if symptoms worsen or new appear   Home Health: PT Equipment/Devices: None  Discharge Condition: Stable CODE STATUS: Full Diet recommendation: Heart healthy  Brief/Interim Summary:  88 y.o. female with medical history significant for ILD, chronic hypoxic respiratory failure on 4-5 L O2 [place], chronic diastolic heart failure, pulmonary artery hypertension, chronic large pericardial effusion, moderate aortic stenosis, anemia of chronic disease and B12 deficiency, history of GI bleed due to gastric/small bowel AVMs, history of PE not on anticoagulation s/p IVC filter, CKD stage IIIb, connective tissue disease/scleroderma, renal cell carcinoma presented with abdominal pain and dark stools.  On presentation, hemoglobin was 6.7 with creatinine of 2.01, lactic acid of 2.3 and 1.6.  FOBT was positive.  UA was negative for UTI.  CT abdomen/pelvis without contrast showed stable massive pericardial effusion up to 5.1 cm in thickness, stable cardiomegaly with dense calcifications of mitral and aortic valves. Distal colonic diverticulosis without diverticulitis noted. An indeterminate 1.5 cm hyperdense left renal lesion also seen, new since prior study.  She was started on IV Protonix .  GI was consulted.  She was transfused 1 unit packed red cells.  She underwent enteroscopy on 09/26/2024.  Subsequently, she was transfused 1 more unit of packed red cells.  Since then, her hemoglobin has remained stable, she has tolerated diet.  GI has cleared her for discharge and has signed off.  She will be discharged home today.  Discharge Diagnoses:   Acute on chronic blood  loss anemia probably due to upper GI bleed from AVMs - Patient has a known history of gastric and small bowel AVMs.  Presented with hemoglobin of 6.7 compared to 10.5 on 09/13/2024.  Imaging as above. - Received 2 unit packed red cells transfusion since admission.  Hemoglobin 8.8 this morning.  Currently on IV Protonix .  -underwent enteroscopy on 09/26/2024: Multiple nonbleeding angiodysplastic lesions in the duodenum and jejunum seen treated with APC.  GI recommended Protonix  40 mg twice a day at discharge and has signed off.  Outpatient follow-up with GI.   - Currently hemodynamically stable and feels okay with discharge.  Discharge patient home today.  AKI on CKD stage IIIb -Baseline creatinine of 1.5-1.6.  Presented with creatinine of 2.01.  Improving to 1.20 this morning.  Outpatient follow-up of BMP.  Chronic diastolic heart failure Chronic large pericardial effusion Moderate aortic stenosis Hypertension - Currently compensated.  Continue diet and fluid restriction.  CT abdomen and pelvis showed stable massive pericardial effusion with no evidence of tamponade.  Outpatient follow-up with cardiology - Continue Lasix .  Resume diltiazem  and spironolactone  on discharge.   Interstitial lung disease Chronic respiratory failure with hypoxia - On 4 to 5 L oxygen  via nasal cannula at home.  Respiratory status currently stable.  Continue home inhaled and nebulized regimen along with oral montelukast .  Outpatient follow-up with pulmonology -Apparently wears BiPAP at night at home   Connective Tissue disease/scleroderma Pulmonary artery hypertension - Outpatient follow-up with pulmonary.  Resume diltiazem .     History of PE - Not on anticoagulation due to GI bleed history.  IVC filter in place   Left renal lesion History of renal cell carcinoma status post cryoablation on the left in  2012 -CT A/P showed an indeterminate 1.5 cm hyperdense left renal lesion which is new since prior  study. -Renal ultrasound showed renal cysts.  Outpatient follow-up with urology   Anxiety - Continue as needed Xanax    Goals of care - palliative care evaluation appreciated.  Patient not interested in hospice at this time.   Physical deconditioning - PT recommended home health PT.  Discharge Instructions  Discharge Instructions     Ambulatory referral to Gastroenterology   Complete by: As directed    What is the reason for referral?: Other   Diet - low sodium heart healthy   Complete by: As directed    Increase activity slowly   Complete by: As directed    No wound care   Complete by: As directed       Allergies as of 09/28/2024       Reactions   Iodinated Contrast Media Other (See Comments)   Renal issues   Codeine  Nausea Only   Hallucinations, too   Other Nausea And Vomiting, Other (See Comments)   -mycin antibiotics.   Also cause hallucinations.   Erythromycin Nausea And Vomiting   Lisinopril Other (See Comments)   Pt doesn't remember the reaction   Mirtazapine  Other (See Comments)   Threw me into orbit   Sulfa  Antibiotics Other (See Comments)   Unknown reaction   Mycophenolate Mofetil Nausea Only, Anxiety, Other (See Comments)   Made me nervous        Medication List     TAKE these medications    albuterol  108 (90 Base) MCG/ACT inhaler Commonly known as: VENTOLIN  HFA INHALE 2 PUFFS INTO THE LUNGS EVERY 6 HOURS AS NEEDED FOR WHEEZING OR SHORTNESS OF BREATH   albuterol  (2.5 MG/3ML) 0.083% nebulizer solution Commonly known as: PROVENTIL  Take 3 mLs (2.5 mg total) by nebulization every 6 (six) hours as needed for wheezing or shortness of breath.   ALPRAZolam  0.25 MG tablet Commonly known as: XANAX  Take 1 tablet (0.25 mg total) by mouth at bedtime as needed for anxiety. What changed: reasons to take this   antiseptic oral rinse Liqd 15 mLs by Mouth Rinse route as needed for dry mouth.   arformoterol  15 MCG/2ML Nebu Commonly known as:  BROVANA  USE 1 VIAL IN NEBULIZER TWICE DAILY What changed: See the new instructions.   augmented betamethasone dipropionate 0.05 % cream Commonly known as: DIPROLENE-AF Apply 1 application  topically 2 (two) times daily as needed (for irritation).   clobetasol ointment 0.05 % Commonly known as: TEMOVATE Apply 1 Application topically 2 (two) times daily as needed (for irritation).   diltiazem  30 MG tablet Commonly known as: CARDIZEM  Take 1 tablet (30 mg total) by mouth every 12 (twelve) hours.   docusate sodium  100 MG capsule Commonly known as: COLACE Take 100 mg by mouth daily.   Ensure High Protein Liqd Take 0.5 Bottles by mouth daily as needed (for supplementation).   famotidine  20 MG tablet Commonly known as: PEPCID  TAKE 1 TABLET BY MOUTH AT BEDTIME   fluticasone  50 MCG/ACT nasal spray Commonly known as: FLONASE  USE 1 SPRAY(S) IN EACH NOSTRIL ONCE DAILY AS NEEDED FOR ALLERGIES What changed: See the new instructions.   furosemide  20 MG tablet Commonly known as: LASIX  Take 2 tablets ( 40 mg ) every morning may take extra 2 tablets for weight gain of 3 lbs What changed:  how much to take how to take this when to take this additional instructions   ipratropium 0.02 % nebulizer solution Commonly  known as: ATROVENT  Take 2.5 mLs (0.5 mg total) by nebulization 2 (two) times daily.   latanoprost  0.005 % ophthalmic solution Commonly known as: XALATAN  Place 1 drop into both eyes at bedtime.   montelukast  10 MG tablet Commonly known as: SINGULAIR  Take 1 tablet (10 mg total) by mouth at bedtime.   OXYGEN  Inhale 3-4 L/min into the lungs continuous.   pantoprazole  40 MG tablet Commonly known as: PROTONIX  Take 1 tablet (40 mg total) by mouth 2 (two) times daily before a meal.   PRESCRIPTION MEDICATION CPAP- At bedtime and during any time of rest   sodium chloride  0.65 % Soln nasal spray Commonly known as: OCEAN Place 1 spray into both nostrils as needed for  congestion.   spironolactone  25 MG tablet Commonly known as: ALDACTONE  Take 1/2 tablet ( 12.5 mg ) every morning   sucralfate  1 GM/10ML suspension Commonly known as: CARAFATE  TAKE 10 MLS BY MOUTH EVERY 6 HOURS AS NEEDED FOR REFLUX What changed: See the new instructions.   triamcinolone  0.1 % paste Commonly known as: KENALOG  Use as directed 1 Application in the mouth or throat 2 (two) times daily. What changed:  how to take this when to take this additional instructions   triamcinolone  cream 0.1 % Commonly known as: KENALOG  Apply 1 Application topically See admin instructions. Apply to affected leg(s) after showers and up to 2 additional times a day as needed for irritation   TYLENOL  500 MG tablet Generic drug: acetaminophen  Take 250-500 mg by mouth every 6 (six) hours as needed for mild pain (pain score 1-3) (or headaches). What changed: Another medication with the same name was removed. Continue taking this medication, and follow the directions you see here.        Follow-up Information     Rollene Almarie LABOR, MD. Schedule an appointment as soon as possible for a visit in 1 week(s).   Specialty: Internal Medicine Why: With repeat CBC/BMP Contact information: [address] [place] [address] [phone]                Allergies  Allergen Reactions   Iodinated Contrast Media Other (See Comments)    Renal issues   Codeine  Nausea Only    Hallucinations, too   Other Nausea And Vomiting and Other (See Comments)    -mycin antibiotics.   Also cause hallucinations.   Erythromycin Nausea And Vomiting   Lisinopril Other (See Comments)    Pt doesn't remember the reaction   Mirtazapine  Other (See Comments)    Threw me into orbit   Sulfa  Antibiotics Other (See Comments)    Unknown reaction   Mycophenolate Mofetil Nausea Only, Anxiety and Other (See Comments)    Made me nervous    Consultations: GI   Procedures/Studies: US  RENAL Result  Date: 09/26/2024 CLINICAL DATA:  Acute kidney injury superimposed on chronic kidney disease. 1.5 cm indeterminate hyperdense lesion in the mid left kidney on a recent abdomen and pelvis CT. EXAM: RENAL / URINARY TRACT ULTRASOUND COMPLETE COMPARISON:  Abdomen and pelvis CT dated 09/25/2024. Limited right upper quadrant abdomen ultrasound dated 03/09/2024. FINDINGS: Right Kidney: Renal measurements: 8.8 x 3.9 x 3.6 cm = volume: 64 mL. Mildly diffusely echogenic. 8 mm simple cyst not needing imaging follow-up. No hydronephrosis. Left Kidney: Renal measurements: 9.2 x 4.4 x 3.9 cm = volume: 82 mL. Mildly diffusely echogenic. 1.3 and 0.9 cm simple cysts not requiring imaging follow-up. The larger of these corresponds to the 1.5 cm indeterminate hyperdense lesion seen  on the recent CT. Additional 1.1 cm oval, circumscribed, hypoechoic mass without internal blood flow with color Doppler. This corresponds to 1 of the hemorrhagic cysts on the recent CT. This also does not require imaging follow-up Bladder: Appears normal for degree of bladder distention. Other: None. IMPRESSION: 1. Mildly diffusely echogenic kidneys, consistent with medical renal disease. 2. No hydronephrosis. 3. Bilateral renal cysts not requiring imaging follow-up. Electronically Signed   By: Elspeth Bathe M.D.   On: 09/26/2024 11:43   CT ABDOMEN PELVIS WO CONTRAST Result Date: 09/25/2024 CLINICAL DATA:  Generalized abdominal pain, dark stools EXAM: CT ABDOMEN AND PELVIS WITHOUT CONTRAST TECHNIQUE: Multidetector CT imaging of the abdomen and pelvis was performed following the standard protocol without IV contrast. Unenhanced CT was performed per clinician order. Lack of IV contrast limits sensitivity and specificity, especially for evaluation of abdominal/pelvic solid viscera. RADIATION DOSE REDUCTION: This exam was performed according to the departmental dose-optimization program which includes automated exposure control, adjustment of the mA and/or kV  according to patient size and/or use of iterative reconstruction technique. COMPARISON:  04/04/2019, 06/15/2024 FINDINGS: Lower chest: There is a massive pericardial effusion, measuring up to 5.1 cm in thickness, unchanged since the previous CT exam. There is dense calcification of the mitral and aortic valves. Stable cardiomegaly. No acute pleural or parenchymal lung disease. Hepatobiliary: Unremarkable unenhanced appearance of the liver and gallbladder. Pancreas: Unremarkable unenhanced appearance. Spleen: Unremarkable unenhanced appearance. Adrenals/Urinary Tract: There are 2 subcentimeter hemorrhagic cyst within the lower pole left kidney. Within the ventral aspect of the mid kidney there is an indeterminate hyperdense lesion measuring 1.2 x 1.5 cm and measuring 52 HU. Further evaluation with ultrasound may be useful to exclude solid lesion if the patient would be a therapy candidate should neoplasm be detected. Chronic scarring within the upper pole left kidney consistent with prior cryoablation. The right kidney is unremarkable. No urinary tract calculi or obstructive uropathy within either kidney. Bladder is minimally distended, without gross abnormality. The adrenals are stable. Stomach/Bowel: No bowel obstruction or ileus. Prior appendectomy per history. Distal colonic diverticulosis without diverticulitis. No bowel wall thickening or inflammatory change. Vascular/Lymphatic: Diffuse aortic atherosclerosis. IVC filter. No pathologic adenopathy. Reproductive: Uterus is atrophic.  No adnexal masses. Other: No free fluid or free intraperitoneal gas. No abdominal wall hernia. Musculoskeletal: There are no acute or destructive bony abnormalities. Reconstructed images demonstrate no additional findings. IMPRESSION: 1. Stable massive pericardial effusion measuring up to 5.1 cm in thickness. 2. Stable cardiomegaly, with dense calcifications of the mitral and aortic valves. 3. Distal colonic diverticulosis without  diverticulitis. 4. Indeterminate 1.5 cm hyperdense left renal lesion, new since prior study. If the patient would be a candidate should renal neoplasm be detected, renal ultrasound may be useful to exclude solid lesion. 5.  Aortic Atherosclerosis (ICD10-I70.0). Electronically Signed   By: Ozell Daring M.D.   On: 09/25/2024 17:13      Subjective: Patient seen and examined at bedside.  Had some dark stool last night.  Denies worsening abdominal pain, fever or vomiting.  Feels okay to be discharged today.  Discharge Exam: Vitals:   09/28/24 0810 09/28/24 0910  BP: (!) 129/58   Pulse: 90   Resp: 18   Temp: 98.5 F (36.9 C)   SpO2: 99% 99%    General: Pt is alert, awake, not in acute distress.  Elderly female lying in bed.  On 4 L oxygen  via nasal cannula Cardiovascular: rate controlled, S1/S2 + Respiratory: bilateral decreased breath sounds at bases with  scattered crackles Abdominal: Soft, NT, ND, bowel sounds + Extremities: Trace lower extremity edema; no cyanosis    The results of significant diagnostics from this hospitalization (including imaging, microbiology, ancillary and laboratory) are listed below for reference.     Microbiology: Recent Results (from the past 240 hours)  MRSA Next Gen by PCR, Nasal     Status: None   Collection Time: 09/25/24 11:32 PM   Specimen: Nasal Mucosa; Nasal Swab  Result Value Ref Range Status   MRSA by PCR Next Gen NOT DETECTED NOT DETECTED Final    Comment: (NOTE) The GeneXpert MRSA Assay (FDA approved for NASAL specimens only), is one component of a comprehensive MRSA colonization surveillance program. It is not intended to diagnose MRSA infection nor to guide or monitor treatment for MRSA infections. Test performance is not FDA approved in patients less than 2 years old. Performed at [facility], [address] [place], [place] [address]      Labs: BNP (last 3 results) Recent Labs    12/04/23 1511  03/23/24 2023  BNP 1,686.5* 2,711.9*   Basic Metabolic Panel: Recent Labs  Lab 09/25/24 1518 09/26/24 0003 09/27/24 0322 09/28/24 0556  NA 140 139 140 138  K 4.4 4.4 4.8 4.6  CL 96* 97* 99 97*  CO2 30 32 35* 34*  GLUCOSE 137* 116* 121* 90  BUN 99* 106* 90* 76*  CREATININE 2.01* 1.79* 1.43* 1.20*  CALCIUM  9.1 8.6* 9.2 8.8*  MG  --   --  2.1 2.1   Liver Function Tests: Recent Labs  Lab 09/25/24 1518  AST 25  ALT 9  ALKPHOS 85  BILITOT 0.4  PROT 6.7  ALBUMIN 4.2   Recent Labs  Lab 09/25/24 1518  LIPASE 50   No results for input(s): AMMONIA in the last 168 hours. CBC: Recent Labs  Lab 09/25/24 1518 09/26/24 0003 09/26/24 0655 09/26/24 2113 09/27/24 0322 09/28/24 0556  WBC 7.9 10.2  --   --  8.8 8.3  NEUTROABS 6.7  --   --   --  7.0 6.4  HGB 6.7* 6.4* 7.7* 8.7* 8.9* 8.8*  HCT 21.8* 20.8* 24.5* 27.8* 27.6* 28.6*  MCV 112.4* 114.3*  --   --  101.5* 104.4*  PLT 208 197  --   --  177 172   Cardiac Enzymes: No results for input(s): CKTOTAL, CKMB, CKMBINDEX, TROPONINI in the last 168 hours. BNP: Invalid input(s): POCBNP CBG: No results for input(s): GLUCAP in the last 168 hours. D-Dimer No results for input(s): DDIMER in the last 72 hours. Hgb A1c No results for input(s): HGBA1C in the last 72 hours. Lipid Profile No results for input(s): CHOL, HDL, LDLCALC, TRIG, CHOLHDL, LDLDIRECT in the last 72 hours. Thyroid  function studies No results for input(s): TSH, T4TOTAL, T3FREE, THYROIDAB in the last 72 hours.  Invalid input(s): FREET3 Anemia work up No results for input(s): VITAMINB12, FOLATE, FERRITIN, TIBC, IRON , RETICCTPCT in the last 72 hours. Urinalysis    Component Value Date/Time   COLORURINE YELLOW 09/25/2024 1518   APPEARANCEUR CLEAR 09/25/2024 1518   LABSPEC 1.010 09/25/2024 1518   PHURINE 5.5 09/25/2024 1518   GLUCOSEU NEGATIVE 09/25/2024 1518   GLUCOSEU NEGATIVE 03/13/2023 1512   HGBUR  TRACE (A) 09/25/2024 1518   BILIRUBINUR NEGATIVE 09/25/2024 1518   BILIRUBINUR neg 08/03/2023 1351   KETONESUR NEGATIVE 09/25/2024 1518   PROTEINUR NEGATIVE 09/25/2024 1518   UROBILINOGEN negative (A) 08/03/2023 1351   UROBILINOGEN 1.0 03/13/2023 1512   NITRITE NEGATIVE 09/25/2024  1518   LEUKOCYTESUR SMALL (A) 09/25/2024 1518   Sepsis Labs Recent Labs  Lab 09/25/24 1518 09/26/24 0003 09/27/24 0322 09/28/24 0556  WBC 7.9 10.2 8.8 8.3   Microbiology Recent Results (from the past 240 hours)  MRSA Next Gen by PCR, Nasal     Status: None   Collection Time: 09/25/24 11:32 PM   Specimen: Nasal Mucosa; Nasal Swab  Result Value Ref Range Status   MRSA by PCR Next Gen NOT DETECTED NOT DETECTED Final    Comment: (NOTE) The GeneXpert MRSA Assay (FDA approved for NASAL specimens only), is one component of a comprehensive MRSA colonization surveillance program. It is not intended to diagnose MRSA infection nor to guide or monitor treatment for MRSA infections. Test performance is not FDA approved in patients less than 33 years old. Performed at [facility], [address] [place], [place] [address]      Time coordinating discharge: 35 minutes  SIGNED:   Sophie Mao, MD  [facility] 09/28/2024, 10:01 AM

## 2024-09-28 NOTE — Evaluation (Signed)
 Occupational Therapy Evaluation Patient Details Name: Paula Ward MRN: [id number] DOB: 06-10-1936 Today's Date: 09/28/2024   History of Present Illness   88 y.o. female admitted with Acute on chronic blood loss anemia. PMH: ILD, chronic hypoxic respiratory failure on 4-5 L O2 [place], chronic diastolic heart failure, pulmonary artery hypertension, chronic large pericardial effusion, moderate aortic stenosis, anemia of chronic disease and B12 deficiency, history of GI bleed due to gastric/small bowel AVMs, history of PE not on anticoagulation s/p IVC filter, CKD stage IIIb, connective tissue disease/scleroderma, renal cell carcinoma     Clinical Impressions Prior to this admission, patient living alone, using 3-4 L of oxygen  at baseline, and walking with a rollator. Patient has a PCA M-F from 1-5 to assist with household tasks and for showering. Currently, patient on 4L with O2 at 98% and supervision for bed mobility. Patient politely declining OOB despite encouragement, with MD present and informing patient of discharge. Patient would benefit from therapy services however patient politely declining stating, I dont need it! OT will continue to follow acutely.      If plan is discharge home, recommend the following:   A little help with walking and/or transfers;A little help with bathing/dressing/bathroom;Assistance with cooking/housework;Assist for transportation     Functional Status Assessment   Patient has had a recent decline in their functional status and demonstrates the ability to make significant improvements in function in a reasonable and predictable amount of time.     Equipment Recommendations   None recommended by OT     Recommendations for Other Services         Precautions/Restrictions   Precautions Precautions: Fall Recall of Precautions/Restrictions: Intact Precaution/Restrictions Comments: baseline 3-4L O2     Mobility Bed Mobility Overal bed  mobility: Needs Assistance Bed Mobility: Supine to Sit, Sit to Supine     Supine to sit: Supervision, Used rails Sit to supine: Supervision   General bed mobility comments: supervision for safety    Transfers Overall transfer level: Needs assistance                 General transfer comment: politely declining      Balance Overall balance assessment: Mild deficits observed, not formally tested                                         ADL either performed or assessed with clinical judgement   ADL Overall ADL's : Needs assistance/impaired Eating/Feeding: Set up;Sitting   Grooming: Set up;Sitting;Wash/dry hands;Wash/dry face   Upper Body Bathing: Contact guard assist;Sitting   Lower Body Bathing: Contact guard assist;Sitting/lateral leans;Sit to/from stand   Upper Body Dressing : Contact guard assist;Sitting   Lower Body Dressing: Contact guard assist;Sit to/from stand;Sitting/lateral leans       Toileting- Clothing Manipulation and Hygiene: Contact guard assist;Sit to/from stand;Sitting/lateral lean       Functional mobility during ADLs: Contact guard assist;Cueing for sequencing;Cueing for safety General ADL Comments: Prior to this admission, patient living alone, using 3-4 L of oxygen  at baseline, and walking with a rollator. Patient has a PCA M-F from 1-5 to assist with household tasks and for showering. Currently, patient on 4L with O2 at 98% and supervision for bed mobility. Patient politely declining OOB despite encouragement, with MD present and informing patient of discharge. Patient would benefit from therapy services however patient politely declining stating, I dont need it! OT  will continue to follow acutely.     Vision Baseline Vision/History: 0 No visual deficits Ability to See in Adequate Light: 0 Adequate Patient Visual Report: No change from baseline       Perception Perception: Not tested       Praxis Praxis: Not tested        Pertinent Vitals/Pain Pain Assessment Pain Assessment: No/denies pain     Extremity/Trunk Assessment Upper Extremity Assessment Upper Extremity Assessment: Right hand dominant;Overall [facility] for tasks assessed   Lower Extremity Assessment Lower Extremity Assessment: Defer to PT evaluation   Cervical / Trunk Assessment Cervical / Trunk Assessment: Kyphotic   Communication Communication Communication: No apparent difficulties   Cognition Arousal: Alert Behavior During Therapy: WFL for tasks assessed/performed Cognition: No apparent impairments                               Following commands: Intact       Cueing  General Comments   Cueing Techniques: Verbal cues  VSS on 4L   Exercises     Shoulder Instructions      Home Living Family/patient expects to be discharged to:: Private residence Living Arrangements: Alone Available Help at Discharge: Family;Available PRN/intermittently Type of Home: House Home Access: Ramped entrance     Home Layout: One level     Bathroom Shower/Tub: Walk-in shower         Home Equipment: Grab bars - tub/shower;Shower seat;Cane - single Librarian, Academic (2 wheels);Rollator (4 wheels);Transport chair   Additional Comments: Pt reports PCA 1-5 M-F to assist with household chores; wears 3-4L O2 24/7 at home      Prior Functioning/Environment Prior Level of Function : Independent/Modified Independent;Driving             Mobility Comments: pt reports using rollator my carry all in the home, bring [facility] into community and uses transport chair with family pushing her ADLs Comments: pt reports ind with self care, has PCA 1-5pm M-F, has cleaning services    OT Problem List: Decreased strength;Decreased activity tolerance;Impaired balance (sitting and/or standing)   OT Treatment/Interventions: Self-care/ADL training;Therapeutic exercise;Energy conservation;DME and/or AE instruction;Manual therapy;Therapeutic  activities;Balance training;Patient/family education      OT Goals(Current goals can be found in the care plan section)   Acute Rehab OT Goals Patient Stated Goal: to get better OT Goal Formulation: With patient Time For Goal Achievement: 10/12/24 Potential to Achieve Goals: Good   OT Frequency:  Min 2X/week    Co-evaluation              AM-PAC OT 6 Clicks Daily Activity     Outcome Measure Help from another person eating meals?: A Little Help from another person taking care of personal grooming?: A Little Help from another person toileting, which includes using toliet, bedpan, or urinal?: A Little Help from another person bathing (including washing, rinsing, drying)?: A Little Help from another person to put on and taking off regular upper body clothing?: A Little Help from another person to put on and taking off regular lower body clothing?: A Little 6 Click Score: 18   End of Session Nurse Communication: Mobility status  Activity Tolerance: Patient tolerated treatment well Patient left: in bed;with call bell/phone within reach;with bed alarm set  OT Visit Diagnosis: Unsteadiness on feet (R26.81);Other abnormalities of gait and mobility (R26.89);Muscle weakness (generalized) (M62.81)                Time:  9067-9043 OT Time Calculation (min): 24 min Charges:  OT General Charges $OT Visit: 1 Visit OT Evaluation $OT Eval Moderate Complexity: 1 Mod OT Treatments $Self Care/Home Management : 8-22 mins  Ronal Gift E. Taliah Porche, OTR/L Acute Rehabilitation Services [phone]   Ronal Gift Salt 09/28/2024, 10:50 AM

## 2024-09-28 NOTE — Progress Notes (Signed)
Discharge instructions given to patient no questions at this time.

## 2024-09-29 ENCOUNTER — Telehealth: Payer: Self-pay

## 2024-09-29 NOTE — Telephone Encounter (Signed)
 Copied from CRM #[id number]. Topic: Appointments - Scheduling Inquiry for Clinic >> Sep 29, 2024  7:57 AM Paula Ward wrote: Reason for CRM: Patient was recently discharged from hospital 09/28/24 and when trying to schedule hospital follow up wit Paula Ward patient stated the appointment times were too early for her.She is looking to come in next Friday late morning early afternoon.

## 2024-09-29 NOTE — Telephone Encounter (Signed)
 Pt called in stating she received a phone call but couldn't make it to the phone in enough time. She says to please call back

## 2024-09-29 NOTE — Telephone Encounter (Signed)
 No appt available for hospital f/u at the time requested by pt. LVM for pt to call office to schedule.

## 2024-10-03 ENCOUNTER — Encounter (HOSPITAL_BASED_OUTPATIENT_CLINIC_OR_DEPARTMENT_OTHER): Attending: Internal Medicine | Admitting: Internal Medicine

## 2024-10-03 DIAGNOSIS — J849 Interstitial pulmonary disease, unspecified: Secondary | ICD-10-CM | POA: Diagnosis not present

## 2024-10-03 DIAGNOSIS — Z9981 Dependence on supplemental oxygen: Secondary | ICD-10-CM | POA: Diagnosis not present

## 2024-10-03 DIAGNOSIS — M349 Systemic sclerosis, unspecified: Secondary | ICD-10-CM | POA: Insufficient documentation

## 2024-10-03 DIAGNOSIS — L89100 Pressure ulcer of unspecified part of back, unstageable: Secondary | ICD-10-CM | POA: Diagnosis not present

## 2024-10-04 ENCOUNTER — Encounter: Payer: Self-pay | Admitting: Pulmonary Disease

## 2024-10-04 ENCOUNTER — Ambulatory Visit: Admitting: Pulmonary Disease

## 2024-10-04 ENCOUNTER — Encounter: Payer: Self-pay | Admitting: Internal Medicine

## 2024-10-04 VITALS — BP 119/81 | HR 107 | Temp 98.0°F | Ht 63.0 in | Wt 102.8 lb

## 2024-10-04 DIAGNOSIS — J4540 Moderate persistent asthma, uncomplicated: Secondary | ICD-10-CM

## 2024-10-04 MED ORDER — IPRATROPIUM-ALBUTEROL 0.5-2.5 (3) MG/3ML IN SOLN: 3.0000 mL | RESPIRATORY_TRACT | 6 refills | Status: AC | PRN

## 2024-10-04 MED ORDER — COMBIVENT RESPIMAT 20-100 MCG/ACT IN AERS: 1.0000 | INHALATION_SPRAY | Freq: Four times a day (QID) | RESPIRATORY_TRACT | 6 refills | Status: AC

## 2024-10-04 NOTE — Patient Instructions (Signed)
 Nice to see you again  Will walk to see if you qualify for portable oxygen  concentrator once again  If not and I cannot recommend it, if so then we will work with imaging to find the best product for you  Use Brovana  (arformoterol ) nebulized twice a day every day  Use DuoNebs every 4 hours as needed at home for shortness of breath.  Use Combivent , new prescription, 2 puffs every 4 hours as needed when you are out of the house for shortness of breath.  Return to clinic in 3 months or sooner as needed with Dr. Annella

## 2024-10-04 NOTE — Progress Notes (Unsigned)
 Subjective:    Patient ID: Paula Ward, female    DOB: [birth year], 88 y.o.   MRN: [id number]     HPI Paula Ward is here for follow up from hospital.   Admitted 12/7-12/10.  Presented with abdominal pain, dark stools.  She is a medical history including interstitial lung disease, chronic hypoxic respiratory failure on 4 L O2 via nasal cannula, HFpEF, PAH, chronic large pericardial effusion, moderate AAS, anemia of chronic disease, B12 deficiency, history of GI bleed due to gastric/small bowel AVMs, history of PE not on AC s/p IVC, CKD stage 3b, connective tissue disease/scleroderma, renal cell carcinoma who presented with abdominal pain and dark stools x 2 days.  She denied nausea, vomiting, blood in stool.  She did have some fatigue, lightheadedness with exertion, increased SOB.  BP 107/41, RR 20, O2 100% on RA.   Hgb 6.7, BUN 99. Cr 2.01, lipase 50, lactic acid 2.3.  FOBT pos.  uA neg.  Other labs and vitals normal.   Ct abd/pelvis -stable massive pericardial effusion, diverticulosis, indeterminate 1.5 cm left renal lesion - new since last study.     Acute on chronic blood loss anemia, probably upper GI bleed from AVM H/o GIB from gastric and small bowel AVMs Hgb 6.7   ( 09/13/24 - 10.5) S/p 2 units PRBCs IV protonix  Hgb 8.8 on discharged EGD 12/8 - multiple nonbleeding angiodysplastic lesion in duodemun and jejunum - treated with APC On discharged started on protonix  40 mg bid  Outpatient GI follow up  AKI on CKD3b Baseline Cr 1.5-1.6 -- on admission Cr 2.01 Improved  HFpEF Chronic large pericardial effusion, mod AS, htn Compensated No evidence of tamponade Cardio f/u Continue lasix , resumed diltiazem  and spironlactone which were held temporarily  ILD Chronic resp failure w hyposia on 4-5 L [place] at home Stable Continue home inhaler, nebs, singulair  F/u pulmonary BIPAP at night   Hx PE No on a/c due to GI blood IVC filter in place  Left renal lesion H/o  renal cell carcinoma s/p cryoablation on left 2012 Ct scan 1/5 cm hyperdense left renal lesion - new Renal US  showed renal cysts F/u with urology  Anxiety Xanax  prn  Goals of care Palliative care talked to patient - she is not interested in hospice at this time  Physical deconditioning Home PT     Medications and allergies reviewed with patient and updated if appropriate.  Medications Ordered Prior to Encounter[1]   Review of Systems     Objective:  There were no vitals filed for this visit. BP Readings from Last 3 Encounters:  10/04/24 119/81  09/28/24 (!) 129/58  09/13/24 (!) 112/54   Wt Readings from Last 3 Encounters:  10/04/24 102 lb 12.8 oz (46.6 kg)  09/28/24 110 lb 7.2 oz (50.1 kg)  09/13/24 98 lb 11.2 oz (44.8 kg)   There is no height or weight on file to calculate BMI.    Physical Exam     Lab Results  Component Value Date   WBC 8.3 09/28/2024   HGB 8.8 (L) 09/28/2024   HCT 28.6 (L) 09/28/2024   PLT 172 09/28/2024   GLUCOSE 90 09/28/2024   CHOL 158 06/24/2018   TRIG 112.0 06/24/2018   HDL 57.50 06/24/2018   LDLCALC 78 06/24/2018   ALT 9 09/25/2024   AST 25 09/25/2024   NA 138 09/28/2024   K 4.6 09/28/2024   CL 97 (L) 09/28/2024   CREATININE 1.20 (H) 09/28/2024   BUN  76 (H) 09/28/2024   CO2 34 (H) 09/28/2024   TSH 5.953 (H) 03/23/2024   INR 1.1 10/10/2022   HGBA1C 5.2 01/06/2020     Assessment & Plan:    See Problem List for Assessment and Plan of chronic medical problems.       [1]  Current Outpatient Medications on File Prior to Visit  Medication Sig Dispense Refill   acetaminophen  (TYLENOL ) 500 MG tablet Take 250-500 mg by mouth every 6 (six) hours as needed for mild pain (pain score 1-3) (or headaches).     ALPRAZolam  (XANAX ) 0.25 MG tablet Take 1 tablet (0.25 mg total) by mouth at bedtime as needed for anxiety. 30 tablet 1   antiseptic oral rinse (BIOTENE) LIQD 15 mLs by Mouth Rinse route as needed for dry mouth.      arformoterol  (BROVANA ) 15 MCG/2ML NEBU USE 1 VIAL IN NEBULIZER TWICE DAILY (Patient taking differently: Take 15 mcg by nebulization 2 (two) times daily.) 120 mL 0   augmented betamethasone dipropionate (DIPROLENE-AF) 0.05 % cream Apply 1 application  topically 2 (two) times daily as needed (for irritation).     clobetasol ointment (TEMOVATE) 0.05 % Apply 1 Application topically 2 (two) times daily as needed (for irritation).     diltiazem  (CARDIZEM ) 30 MG tablet Take 1 tablet (30 mg total) by mouth every 12 (twelve) hours. 60 tablet 11   docusate sodium  (COLACE) 100 MG capsule Take 100 mg by mouth daily.     famotidine  (PEPCID ) 20 MG tablet TAKE 1 TABLET BY MOUTH AT BEDTIME (Patient not taking: Reported on 10/04/2024) 30 tablet 0   fluticasone  (FLONASE ) 50 MCG/ACT nasal spray USE 1 SPRAY(S) IN EACH NOSTRIL ONCE DAILY AS NEEDED FOR ALLERGIES (Patient taking differently: Place 1 spray into both nostrils daily as needed for allergies or rhinitis.) 16 g 0   furosemide  (LASIX ) 20 MG tablet Take 2 tablets ( 40 mg ) every morning may take extra 2 tablets for weight gain of 3 lbs (Patient taking differently: Take 40 mg by mouth See admin instructions. Take 40 mg by mouth in the morning and an additional 40 mg once a day as needed for an overnight weight gain of 3 pounds) 360 tablet 3   Ipratropium-Albuterol  (COMBIVENT  RESPIMAT) 20-100 MCG/ACT AERS respimat Inhale 1 puff into the lungs every 6 (six) hours. 1 each 6   ipratropium-albuterol  (DUONEB) 0.5-2.5 (3) MG/3ML SOLN Take 3 mLs by nebulization every 4 (four) hours as needed. 360 mL 6   latanoprost  (XALATAN ) 0.005 % ophthalmic solution Place 1 drop into both eyes at bedtime.     montelukast  (SINGULAIR ) 10 MG tablet Take 1 tablet (10 mg total) by mouth at bedtime. (Patient not taking: Reported on 10/04/2024) 90 tablet 3   Nutritional Supplements (ENSURE HIGH PROTEIN) LIQD Take 0.5 Bottles by mouth daily as needed (for supplementation).     OXYGEN  Inhale 3-4  L/min into the lungs continuous.     pantoprazole  (PROTONIX ) 40 MG tablet Take 1 tablet (40 mg total) by mouth 2 (two) times daily before a meal. 60 tablet 0   PRESCRIPTION MEDICATION CPAP- At bedtime and during any time of rest     sodium chloride  (OCEAN) 0.65 % SOLN nasal spray Place 1 spray into both nostrils as needed for congestion.     spironolactone  (ALDACTONE ) 25 MG tablet Take 1/2 tablet ( 12.5 mg ) every morning     sucralfate  (CARAFATE ) 1 GM/10ML suspension TAKE 10 MLS BY MOUTH EVERY 6 HOURS AS NEEDED FOR  REFLUX (Patient taking differently: Take 1 g by mouth every 6 (six) hours as needed (if the stomach feels full).) 420 mL 0   triamcinolone  (KENALOG ) 0.1 % paste Use as directed 1 Application in the mouth or throat 2 (two) times daily. (Patient taking differently: 1 Application See admin instructions. Apply a pea-sized amount to teeth at bedtime and do not rinse afterwards or eat for 30 minutes afterwards) 5 g 12   triamcinolone  cream (KENALOG ) 0.1 % Apply 1 Application topically See admin instructions. Apply to affected leg(s) after showers and up to 2 additional times a day as needed for irritation     Current Facility-Administered Medications on File Prior to Visit  Medication Dose Route Frequency Provider Last Rate Last Admin   cyanocobalamin  (VITAMIN B12) injection 1,000 mcg  1,000 mcg Intramuscular Once Thayil, Irene T, PA-C

## 2024-10-04 NOTE — Progress Notes (Signed)
 @Patient  ID: Paula Ward, female    DOB: [birth year], 88 y.o.   MRN: [id number]  Chief Complaint  Patient presents with   Medical Management of Chronic Issues    Pt states she was recently in the hospital, and daughter states she has not been good. States she had internal bleeding and came home from the hospital one week ago.     Referring provider: Rollene Almarie LABOR, MD  HPI:   88 y.o. woman with history of pulmonary hypertension likely multifactorial as well as ILD, restrictive lung disease due to CTD/scleroderma whom we are seeing in follow-up for acute on chronic respiratory failure and multifactorial dyspnea on exertion.  Multiple hospital notes reviewed.  Hospitalized again in interim.  This time for GI bleed.  AVMs again.  Cauterized.  Appears euvolemic.  Breathing very dyspneic still.  Wearing 4 L of oxygen .  We ambulated around the office today albeit short distance and slow pace and she did not desaturate on room air.  HPI at initial visit: Patient here for evaluation of pulmonary hypertension.  Multiple hospitalizations for decompensated CHF left-sided disease, pulmonary venous hypertension.  As evidenced by serial images with pleural effusions and pulmonary edema.  Her echocardiogram over time is showing a severely dilated left atrium with valvular dysfunction as well as diastolic dysfunction.  Fortunately her RV functional serial echocardiograms look fine.  Her most recent right heart catheterization LVEDP of 18, PVR of 2 Wood units.  Her prior right heart catheterization at [facility] years prior with a PVR less than 2.  Both times on Opsumit .  Her initial cath 2001 with PVR 5.  We discussed at length the etiology of her pulmonary hypertension.  Has evolved over time.  The changing physiology of her pulmonary hypertension as a relates to her left-sided disease and valvular disease and likely worsening on pulmonary vasodilators.  She expressed understanding.  She is feeling  well since discharge.  Feels better off the Opsumit .  Discussed how VQ mismatch could worsen on Opsumit  given her interstitial lung disease.  Overall her hypoxemia seems improved.  She walked in the clinic today and immediate after sitting down with send saturation was 94%.  We discussed ongoing oxygen  use with exertion that she is to monitor at home as discussed below.   Questionaires / Pulmonary Flowsheets:   ACT:      No data to display          MMRC:     No data to display          Epworth:      No data to display          Tests:   FENO:  No results found for: NITRICOXIDE  PFT:    Latest Ref Rng & Units 04/12/2024    2:06 PM 08/25/2023    2:45 PM 02/03/2022   11:01 AM  PFT Results  FVC-Pre L 1.04  0.96  1.34   FVC-Predicted Pre % 46  42  56   FVC-Post L 1.00  0.91    FVC-Predicted Post % 44  40    Pre FEV1/FVC % % 89  79  84   Post FEV1/FCV % % 91  83    FEV1-Pre L 0.92  0.76  1.12   FEV1-Predicted Pre % 55  45  64   FEV1-Post L 0.92  0.76    DLCO uncorrected ml/min/mmHg 6.29  7.39  10.32   DLCO UNC% % 35  41  57  DLCO corrected ml/min/mmHg  8.46  11.49   DLCO COR %Predicted %  47  63   DLVA Predicted % 58  119  119   TLC L 2.43  3.02  2.88   TLC % Predicted % 49  61  58   RV % Predicted % 58  86  62   Personally reviewed and interpreted at time spirometry suggestive of severe restriction versus air trapping, no bronchodilator response.  Lung volumes consistent with severe restriction, DLCO severely reduced.  WALK:     10/04/2024    3:27 PM 07/04/2024    4:43 PM 07/15/2023    4:27 PM 03/13/2022    3:15 PM 01/06/2022   12:08 PM  SIX MIN WALK  Supplimental Oxygen  during Test? (L/min) No Yes No    O2 Flow Rate  3 L/min     Type  Pulse     2 Minute Oxygen  Saturation %    90 % 89 %  2 Minute HR    107 111  4 Minute Oxygen  Saturation %    89 % 93 %  4 Minute HR    111 113  6 Minute Oxygen  Saturation %    88 % 92 %  6 Minute HR    108 118  Tech  Comments: Pt walked at a slow pace and had to take two breaks, as she stated that she was experiencing leg pain. Pt had several complaints of SOB. Patient walked slow pace and maintained oxygen  level on Poc above 88-90% (3L). Patient was only able to walk a lap and half due to knee pain. patient walked one lap with no complaints, O2 sat 88%, patient aware to use POC 2L w/exertion/activity      Imaging: Personally reviewed and as per EMR and discussion in this note US  RENAL Result Date: 09/26/2024 CLINICAL DATA:  Acute kidney injury superimposed on chronic kidney disease. 1.5 cm indeterminate hyperdense lesion in the mid left kidney on a recent abdomen and pelvis CT. EXAM: RENAL / URINARY TRACT ULTRASOUND COMPLETE COMPARISON:  Abdomen and pelvis CT dated 09/25/2024. Limited right upper quadrant abdomen ultrasound dated 03/09/2024. FINDINGS: Right Kidney: Renal measurements: 8.8 x 3.9 x 3.6 cm = volume: 64 mL. Mildly diffusely echogenic. 8 mm simple cyst not needing imaging follow-up. No hydronephrosis. Left Kidney: Renal measurements: 9.2 x 4.4 x 3.9 cm = volume: 82 mL. Mildly diffusely echogenic. 1.3 and 0.9 cm simple cysts not requiring imaging follow-up. The larger of these corresponds to the 1.5 cm indeterminate hyperdense lesion seen on the recent CT. Additional 1.1 cm oval, circumscribed, hypoechoic mass without internal blood flow with color Doppler. This corresponds to 1 of the hemorrhagic cysts on the recent CT. This also does not require imaging follow-up Bladder: Appears normal for degree of bladder distention. Other: None. IMPRESSION: 1. Mildly diffusely echogenic kidneys, consistent with medical renal disease. 2. No hydronephrosis. 3. Bilateral renal cysts not requiring imaging follow-up. Electronically Signed   By: Elspeth Bathe M.D.   On: 09/26/2024 11:43   CT ABDOMEN PELVIS WO CONTRAST Result Date: 09/25/2024 CLINICAL DATA:  Generalized abdominal pain, dark stools EXAM: CT ABDOMEN AND PELVIS  WITHOUT CONTRAST TECHNIQUE: Multidetector CT imaging of the abdomen and pelvis was performed following the standard protocol without IV contrast. Unenhanced CT was performed per clinician order. Lack of IV contrast limits sensitivity and specificity, especially for evaluation of abdominal/pelvic solid viscera. RADIATION DOSE REDUCTION: This exam was performed according to the departmental dose-optimization program which includes  automated exposure control, adjustment of the mA and/or kV according to patient size and/or use of iterative reconstruction technique. COMPARISON:  04/04/2019, 06/15/2024 FINDINGS: Lower chest: There is a massive pericardial effusion, measuring up to 5.1 cm in thickness, unchanged since the previous CT exam. There is dense calcification of the mitral and aortic valves. Stable cardiomegaly. No acute pleural or parenchymal lung disease. Hepatobiliary: Unremarkable unenhanced appearance of the liver and gallbladder. Pancreas: Unremarkable unenhanced appearance. Spleen: Unremarkable unenhanced appearance. Adrenals/Urinary Tract: There are 2 subcentimeter hemorrhagic cyst within the lower pole left kidney. Within the ventral aspect of the mid kidney there is an indeterminate hyperdense lesion measuring 1.2 x 1.5 cm and measuring 52 HU. Further evaluation with ultrasound may be useful to exclude solid lesion if the patient would be a therapy candidate should neoplasm be detected. Chronic scarring within the upper pole left kidney consistent with prior cryoablation. The right kidney is unremarkable. No urinary tract calculi or obstructive uropathy within either kidney. Bladder is minimally distended, without gross abnormality. The adrenals are stable. Stomach/Bowel: No bowel obstruction or ileus. Prior appendectomy per history. Distal colonic diverticulosis without diverticulitis. No bowel wall thickening or inflammatory change. Vascular/Lymphatic: Diffuse aortic atherosclerosis. IVC filter. No  pathologic adenopathy. Reproductive: Uterus is atrophic.  No adnexal masses. Other: No free fluid or free intraperitoneal gas. No abdominal wall hernia. Musculoskeletal: There are no acute or destructive bony abnormalities. Reconstructed images demonstrate no additional findings. IMPRESSION: 1. Stable massive pericardial effusion measuring up to 5.1 cm in thickness. 2. Stable cardiomegaly, with dense calcifications of the mitral and aortic valves. 3. Distal colonic diverticulosis without diverticulitis. 4. Indeterminate 1.5 cm hyperdense left renal lesion, new since prior study. If the patient would be a candidate should renal neoplasm be detected, renal ultrasound may be useful to exclude solid lesion. 5.  Aortic Atherosclerosis (ICD10-I70.0). Electronically Signed   By: Ozell Ward M.D.   On: 09/25/2024 17:13     Lab Results: Personally reviewed CBC    Component Value Date/Time   WBC 8.3 09/28/2024 0556   RBC 2.74 (L) 09/28/2024 0556   HGB 8.8 (L) 09/28/2024 0556   HGB 10.5 (L) 09/13/2024 1103   HGB 11.2 04/21/2022 0940   HCT 28.6 (L) 09/28/2024 0556   HCT 32.7 (L) 04/21/2022 0940   PLT 172 09/28/2024 0556   PLT 205 09/13/2024 1103   PLT 242 04/21/2022 0940   MCV 104.4 (H) 09/28/2024 0556   MCV 104 (H) 04/21/2022 0940   MCH 32.1 09/28/2024 0556   MCHC 30.8 09/28/2024 0556   RDW 18.7 (H) 09/28/2024 0556   RDW 13.1 04/21/2022 0940   LYMPHSABS 0.9 09/28/2024 0556   MONOABS 0.6 09/28/2024 0556   EOSABS 0.3 09/28/2024 0556   BASOSABS 0.0 09/28/2024 0556    BMET    Component Value Date/Time   NA 138 09/28/2024 0556   NA 139 05/13/2024 1137   K 4.6 09/28/2024 0556   CL 97 (L) 09/28/2024 0556   CO2 34 (H) 09/28/2024 0556   GLUCOSE 90 09/28/2024 0556   GLUCOSE 101 (H) 10/02/2006 0845   BUN 76 (H) 09/28/2024 0556   BUN 48 (H) 05/13/2024 1137   CREATININE 1.20 (H) 09/28/2024 0556   CREATININE 1.63 (H) 09/13/2024 1103   CALCIUM  8.8 (L) 09/28/2024 0556   GFRNONAA 44 (L)  09/28/2024 0556   GFRNONAA 30 (L) 09/13/2024 1103   GFRAA >60 05/24/2018 0652    BNP    Component Value Date/Time   BNP 2,711.9 (H) 03/23/2024 2023  ProBNP    Component Value Date/Time   PROBNP 12,611.0 (H) 06/16/2024 0444   PROBNP 2,680.0 (H) 06/03/2023 1200    Specialty Problems       Pulmonary Problems   ILD (interstitial lung disease) (HCC)   Qualifier: Diagnosis of  By: Nickola CMA, [place]        Allergic rhinitis   Globus pharyngeus   Chronic respiratory failure with hypoxia (HCC)   IPF (idiopathic pulmonary fibrosis) (HCC)   Oxygen  dependent   Hypoxemia requiring supplemental oxygen     Allergies  Allergen Reactions   Iodinated Contrast Media Other (See Comments)    Renal issues   Codeine  Nausea Only    Hallucinations, too   Other Nausea And Vomiting and Other (See Comments)    -mycin antibiotics.   Also cause hallucinations.   Erythromycin Nausea And Vomiting   Lisinopril Other (See Comments)    Pt doesn't remember the reaction   Mirtazapine  Other (See Comments)    Threw me into orbit   Sulfa  Antibiotics Other (See Comments)    Unknown reaction   Mycophenolate Mofetil Nausea Only, Anxiety and Other (See Comments)    Made me nervous    Immunization History  Administered Date(s) Administered   Fluad Quad(high Dose 65+) 07/24/2020   INFLUENZA, HIGH DOSE SEASONAL PF 07/07/2017, 06/21/2019, 08/08/2024   Influenza-Unspecified 07/15/2013, 02/16/2015, 07/19/2015, 07/18/2016, 08/20/2018, 06/24/2021, 08/03/2022   Moderna Sars-Covid-2 Vaccination 11/01/2019, 11/29/2019, 09/14/2020, 02/16/2021   Pneumococcal Conjugate-13 01/30/2014   Pneumococcal Polysaccharide-23 08/30/2007   Respiratory Syncytial Virus Vaccine ,Recomb Aduvanted(Arexvy ) 09/24/2022   Tdap 06/26/2011, 06/07/2018   Zoster, Live 08/15/2014    Past Medical History:  Diagnosis Date   Anemia    Angiodysplasia of stomach and duodenum    Aortic stenosis    Arthus phenomenon    AVM  (arteriovenous malformation)    Blood transfusion without reported diagnosis    Breast cancer (HCC) 1989   Left   Candida esophagitis (HCC)    Cataract    Chronic respiratory failure (HCC)    CKD stage 3b, GFR 30-44 ml/min (HCC)    Corneal edema    Corneal epithelial basement membrane dystrophy    CREST syndrome (HCC)    GERD (gastroesophageal reflux disease)    w/ HH   Interstitial lung disease (HCC)    Nodule of kidney    Pericardial effusion    PONV (postoperative nausea and vomiting)    Pulmonary embolus (HCC) 2003   Pulmonary hypertension (HCC)    Rectal incontinence    Renal cell carcinoma (HCC)    Scleroderma (HCC)    Tubular adenoma of colon    Uterine polyp     Tobacco History: Social History   Tobacco Use  Smoking Status Never  Smokeless Tobacco Never   Counseling given: Not Answered   Continue to not smoke  Outpatient Encounter Medications as of 10/04/2024  Medication Sig   acetaminophen  (TYLENOL ) 500 MG tablet Take 250-500 mg by mouth every 6 (six) hours as needed for mild pain (pain score 1-3) (or headaches).   ALPRAZolam  (XANAX ) 0.25 MG tablet Take 1 tablet (0.25 mg total) by mouth at bedtime as needed for anxiety.   antiseptic oral rinse (BIOTENE) LIQD 15 mLs by Mouth Rinse route as needed for dry mouth.   arformoterol  (BROVANA ) 15 MCG/2ML NEBU USE 1 VIAL IN NEBULIZER TWICE DAILY (Patient taking differently: Take 15 mcg by nebulization 2 (two) times daily.)   augmented betamethasone dipropionate (DIPROLENE-AF) 0.05 % cream Apply 1 application  topically 2 (  two) times daily as needed (for irritation).   clobetasol ointment (TEMOVATE) 0.05 % Apply 1 Application topically 2 (two) times daily as needed (for irritation).   diltiazem  (CARDIZEM ) 30 MG tablet Take 1 tablet (30 mg total) by mouth every 12 (twelve) hours.   docusate sodium  (COLACE) 100 MG capsule Take 100 mg by mouth daily.   fluticasone  (FLONASE ) 50 MCG/ACT nasal spray USE 1 SPRAY(S) IN EACH  NOSTRIL ONCE DAILY AS NEEDED FOR ALLERGIES (Patient taking differently: Place 1 spray into both nostrils daily as needed for allergies or rhinitis.)   furosemide  (LASIX ) 20 MG tablet Take 2 tablets ( 40 mg ) every morning may take extra 2 tablets for weight gain of 3 lbs (Patient taking differently: Take 40 mg by mouth See admin instructions. Take 40 mg by mouth in the morning and an additional 40 mg once a day as needed for an overnight weight gain of 3 pounds)   Ipratropium-Albuterol  (COMBIVENT  RESPIMAT) 20-100 MCG/ACT AERS respimat Inhale 1 puff into the lungs every 6 (six) hours.   ipratropium-albuterol  (DUONEB) 0.5-2.5 (3) MG/3ML SOLN Take 3 mLs by nebulization every 4 (four) hours as needed.   latanoprost  (XALATAN ) 0.005 % ophthalmic solution Place 1 drop into both eyes at bedtime.   Nutritional Supplements (ENSURE HIGH PROTEIN) LIQD Take 0.5 Bottles by mouth daily as needed (for supplementation).   OXYGEN  Inhale 3-4 L/min into the lungs continuous.   pantoprazole  (PROTONIX ) 40 MG tablet Take 1 tablet (40 mg total) by mouth 2 (two) times daily before a meal.   PRESCRIPTION MEDICATION CPAP- At bedtime and during any time of rest   sodium chloride  (OCEAN) 0.65 % SOLN nasal spray Place 1 spray into both nostrils as needed for congestion.   spironolactone  (ALDACTONE ) 25 MG tablet Take 1/2 tablet ( 12.5 mg ) every morning   sucralfate  (CARAFATE ) 1 GM/10ML suspension TAKE 10 MLS BY MOUTH EVERY 6 HOURS AS NEEDED FOR REFLUX (Patient taking differently: Take 1 g by mouth every 6 (six) hours as needed (if the stomach feels full).)   triamcinolone  (KENALOG ) 0.1 % paste Use as directed 1 Application in the mouth or throat 2 (two) times daily. (Patient taking differently: 1 Application See admin instructions. Apply a pea-sized amount to teeth at bedtime and do not rinse afterwards or eat for 30 minutes afterwards)   triamcinolone  cream (KENALOG ) 0.1 % Apply 1 Application topically See admin instructions.  Apply to affected leg(s) after showers and up to 2 additional times a day as needed for irritation   [DISCONTINUED] albuterol  (PROVENTIL ) (2.5 MG/3ML) 0.083% nebulizer solution Take 3 mLs (2.5 mg total) by nebulization every 6 (six) hours as needed for wheezing or shortness of breath.   [DISCONTINUED] albuterol  (VENTOLIN  HFA) 108 (90 Base) MCG/ACT inhaler INHALE 2 PUFFS INTO THE LUNGS EVERY 6 HOURS AS NEEDED FOR WHEEZING OR SHORTNESS OF BREATH   [DISCONTINUED] ipratropium (ATROVENT ) 0.02 % nebulizer solution Take 2.5 mLs (0.5 mg total) by nebulization 2 (two) times daily.   famotidine  (PEPCID ) 20 MG tablet TAKE 1 TABLET BY MOUTH AT BEDTIME (Patient not taking: Reported on 10/04/2024)   montelukast  (SINGULAIR ) 10 MG tablet Take 1 tablet (10 mg total) by mouth at bedtime. (Patient not taking: Reported on 10/04/2024)   Facility-Administered Encounter Medications as of 10/04/2024  Medication   cyanocobalamin  (VITAMIN B12) injection 1,000 mcg     Review of Systems  Review of Systems  N/a Physical Exam  BP 119/81   Pulse (!) 107   Temp 98 F (  36.7 C)   Ht 5' 3 (1.6 m) Comment: Per pt  Wt 102 lb 12.8 oz (46.6 kg)   SpO2 96% Comment: RA, after ambulating three laps  BMI 18.21 kg/m   Wt Readings from Last 5 Encounters:  10/04/24 102 lb 12.8 oz (46.6 kg)  09/28/24 110 lb 7.2 oz (50.1 kg)  09/13/24 98 lb 11.2 oz (44.8 kg)  08/05/24 97 lb 9.6 oz (44.3 kg)  07/15/24 100 lb 8 oz (45.6 kg)    BMI Readings from Last 5 Encounters:  10/04/24 18.21 kg/m  09/28/24 19.57 kg/m  09/13/24 19.28 kg/m  08/05/24 19.06 kg/m  07/22/24 17.80 kg/m     Physical Exam General: Frail, elderly, in wheelchair Eyes: EOMI Neck: No JVP Pulmonary: Clear Cardiovascular: No edema noted, warm  Assessment & Plan:   Pulmonary hypertension: Documented on the right heart cath first diagnosed 2001.  Serial markers reviewed.  Multifactorial nature of disease primarily group 2 and 3 disease given elevated  wedge pressure and mild ILD, hypoxemia.  At time of initial cath in 2001 PVR was elevated over 5 for group 1 disease likely was primary contributor back then, driven by scleroderma and CTD.  However, has a physiology of her heart has changed over time with diastolic dysfunction, aortic stenosis, mitral valve stenosis, diastolic dysfunction and development of left atrial hypertension, pulmonary venous hypertension, suspect systemic pulmonary vasodilators has led to exacerbation of left-sided congestive heart failure.  Multiple admissions for CHF with signs of pulmonary edema and pleural effusions again indicating pulmonary venous hypertension.  Her most recent catheterizations have indicated a PVR less than 3, admittedly on ERA, Opsumit .  However given left-sided disease and left-sided lower pulmonary venous hypertension, we stopped her Opsumit .  She is feeling better.  Possibly contributing to worsening VQ mismatch as well in the setting of her ILD.  Frankly, given her frailty and complex and multifactorial disease, additional pulmonary vasodilators are likely not in her best interest.  She remains stable off Opsumit , weight stable, decreasing diuretic doses via her cardiology office.  Further titration of diuretics via cardiology.  Stressed importance of low-salt diet and diuretic therapy.   Fortunately RV function on most recent echocardiogram was excellent.  Chronic hypoxemic and hypercarbic respiratory failure: Likely multifactorial but primarily driven by CT ILD.  Ambulated around the office albeit slow pace and short distance 09/2024 and did not desaturate, oxygen  saturations were in the mid 90s on room air.  Encouraged to continue to check oxygen  saturation at home.  Slowly decrease.  I wonder if fluid buildup over time has been a major issue and with aggressive diuresis and hospitalizations back in the late summer this has improved.  Again, encouraged her to check oxygen  saturation at rest and with  ambulation decrease oxygen  as able.  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  Largely unchanged over serial images over the last several years.  No progression.  In the past has declined antifibrotic's given side effect profile.  Given her age and frailty this is reasonable.  Further imaging felt unnecessary given would not change management.  Could consider in the future if worsening for prognosis etc.  Dyspnea exertion: Multifactorial and driven by deconditioning, ILD, possible asthma given improvement with nebulized therapies, significant aortic stenosis, mitral stenosis, congestive heart failure, pulmonary hypertension.  Continue Brovana , continue DuoNebs as needed.  Refilled today.   Return in about 3 months (around 01/02/2025) for f/u Dr. Annella.   Paula JONELLE Annella, MD 10/04/2024  I spent 41 minutes  in the care of the patient including face-to-face visit, review of records, coordination of care.

## 2024-10-05 ENCOUNTER — Ambulatory Visit: Admitting: Internal Medicine

## 2024-10-05 VITALS — BP 112/58 | HR 80 | Temp 98.0°F | Ht 63.0 in | Wt 104.0 lb

## 2024-10-05 DIAGNOSIS — N1832 Chronic kidney disease, stage 3b: Secondary | ICD-10-CM | POA: Diagnosis not present

## 2024-10-05 DIAGNOSIS — J9611 Chronic respiratory failure with hypoxia: Secondary | ICD-10-CM | POA: Diagnosis not present

## 2024-10-05 DIAGNOSIS — F4321 Adjustment disorder with depressed mood: Secondary | ICD-10-CM | POA: Diagnosis not present

## 2024-10-05 DIAGNOSIS — N289 Disorder of kidney and ureter, unspecified: Secondary | ICD-10-CM | POA: Diagnosis not present

## 2024-10-05 DIAGNOSIS — I5032 Chronic diastolic (congestive) heart failure: Secondary | ICD-10-CM | POA: Diagnosis not present

## 2024-10-05 DIAGNOSIS — K922 Gastrointestinal hemorrhage, unspecified: Secondary | ICD-10-CM

## 2024-10-05 DIAGNOSIS — J849 Interstitial pulmonary disease, unspecified: Secondary | ICD-10-CM | POA: Diagnosis not present

## 2024-10-05 DIAGNOSIS — D62 Acute posthemorrhagic anemia: Secondary | ICD-10-CM

## 2024-10-05 DIAGNOSIS — N179 Acute kidney failure, unspecified: Secondary | ICD-10-CM

## 2024-10-05 LAB — COMPREHENSIVE METABOLIC PANEL WITH GFR
ALT: 9 U/L (ref 3–35)
AST: 20 U/L (ref 5–37)
Albumin: 4 g/dL (ref 3.5–5.2)
Alkaline Phosphatase: 82 U/L (ref 39–117)
BUN: 68 mg/dL — ABNORMAL HIGH (ref 6–23)
CO2: 33 meq/L — ABNORMAL HIGH (ref 19–32)
Calcium: 8.8 mg/dL (ref 8.4–10.5)
Chloride: 97 meq/L (ref 96–112)
Creatinine, Ser: 1.54 mg/dL — ABNORMAL HIGH (ref 0.40–1.20)
GFR: 30.47 mL/min — ABNORMAL LOW (ref >60.00)
Glucose, Bld: 124 mg/dL — ABNORMAL HIGH (ref 70–99)
Potassium: 4.5 meq/L (ref 3.5–5.1)
Sodium: 139 meq/L (ref 135–145)
Total Bilirubin: 0.5 mg/dL (ref 0.2–1.2)
Total Protein: 6.8 g/dL (ref 6.0–8.3)

## 2024-10-05 LAB — CBC
HCT: 27.4 % — ABNORMAL LOW (ref 36.0–46.0)
Hemoglobin: 9.2 g/dL — ABNORMAL LOW (ref 12.0–15.0)
MCHC: 33.5 g/dL (ref 30.0–36.0)
MCV: 100.1 fl — ABNORMAL HIGH (ref 78.0–100.0)
Platelets: 222 K/uL (ref 150.0–400.0)
RBC: 2.74 Mil/uL — ABNORMAL LOW (ref 3.87–5.11)
RDW: 17.7 % — ABNORMAL HIGH (ref 11.5–15.5)
WBC: 6.2 K/uL (ref 4.0–10.5)

## 2024-10-05 MED ORDER — FAMOTIDINE 20 MG PO TABS: 20.0000 mg | ORAL_TABLET | Freq: Two times a day (BID) | ORAL | 0 refills | Status: DC

## 2024-10-05 MED ORDER — MONTELUKAST SODIUM 10 MG PO TABS: 10.0000 mg | ORAL_TABLET | Freq: Every day | ORAL | 3 refills | Status: AC

## 2024-10-05 NOTE — Patient Instructions (Addendum)
° ° ° ° °  Blood work was ordered.       Medications changes include :   increase famotidine  to twice daily.      A referral was ordered [facility] and someone will call you to schedule an appointment.    [facility]  Phone: [phone]

## 2024-10-05 NOTE — Assessment & Plan Note (Signed)
 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

## 2024-10-05 NOTE — Assessment & Plan Note (Signed)
 Chronic Recent AKI on CKD 3b Improved with IV fluids and holding diuretics BMP today

## 2024-10-05 NOTE — Assessment & Plan Note (Addendum)
 Chronic Appears little euvolemic Continue furosemide  40 mg daily, extra 40 mg for weight gain -- she has gained weight over the past few days-advised to take the extra furosemide  today when she gets home Continue spironolactone  12.5 mg daily

## 2024-10-05 NOTE — Assessment & Plan Note (Signed)
 Chronic Related to interstitial lung disease On oxygen  continuously via nasal cannula 4-5 L/min Oxygenation here 97% on oxygen  Has already seen pulmonary-yesterday Continue home inhalers/nebulizers, Singulair 

## 2024-10-05 NOTE — Assessment & Plan Note (Signed)
 Acute GI bleed with symptomatic anemia History of GI bleed secondary to gastric and small bowel AVMs S/p 2 units PRB C with improvement in H&H No symptoms consistent with active GI bleed Check CBC She will monitor her stool from

## 2024-10-05 NOTE — Assessment & Plan Note (Signed)
 Resolved while in the hospital with IV fluids and holding diuretics BMP today

## 2024-10-05 NOTE — Assessment & Plan Note (Signed)
 Chronic Continue alprazolam  0.25 mg at bedtime as needed

## 2024-10-05 NOTE — Assessment & Plan Note (Signed)
 Chronic Following with pulmonary Chronic respiratory failure with hypoxia on continuous oxygen  4-5 L per nasal cannula Home inhalers, nebulizers, Singulair  BiPAP at night

## 2024-10-06 ENCOUNTER — Ambulatory Visit: Payer: Self-pay | Admitting: Internal Medicine

## 2024-10-10 ENCOUNTER — Inpatient Hospital Stay: Attending: Physician Assistant

## 2024-10-10 ENCOUNTER — Other Ambulatory Visit: Payer: Self-pay | Admitting: Hematology and Oncology

## 2024-10-10 ENCOUNTER — Inpatient Hospital Stay

## 2024-10-10 VITALS — BP 110/53 | HR 93 | Resp 16

## 2024-10-10 DIAGNOSIS — D631 Anemia in chronic kidney disease: Secondary | ICD-10-CM | POA: Insufficient documentation

## 2024-10-10 DIAGNOSIS — E538 Deficiency of other specified B group vitamins: Secondary | ICD-10-CM | POA: Diagnosis not present

## 2024-10-10 DIAGNOSIS — N1832 Chronic kidney disease, stage 3b: Secondary | ICD-10-CM | POA: Diagnosis present

## 2024-10-10 LAB — CBC WITH DIFFERENTIAL (CANCER CENTER ONLY)
Abs Immature Granulocytes: 0.02 K/uL (ref 0.00–0.07)
Basophils Absolute: 0 K/uL (ref 0.0–0.1)
Basophils Relative: 1 %
Eosinophils Absolute: 0.1 K/uL (ref 0.0–0.5)
Eosinophils Relative: 1 %
HCT: 27.1 % — ABNORMAL LOW (ref 36.0–46.0)
Hemoglobin: 8.4 g/dL — ABNORMAL LOW (ref 12.0–15.0)
Immature Granulocytes: 0 %
Lymphocytes Relative: 10 %
Lymphs Abs: 0.6 K/uL — ABNORMAL LOW (ref 0.7–4.0)
MCH: 32.6 pg (ref 26.0–34.0)
MCHC: 31 g/dL (ref 30.0–36.0)
MCV: 105 fL — ABNORMAL HIGH (ref 80.0–100.0)
Monocytes Absolute: 0.3 K/uL (ref 0.1–1.0)
Monocytes Relative: 5 %
Neutro Abs: 4.8 K/uL (ref 1.7–7.7)
Neutrophils Relative %: 83 %
Platelet Count: 228 K/uL (ref 150–400)
RBC: 2.58 MIL/uL — ABNORMAL LOW (ref 3.87–5.11)
RDW: 16.3 % — ABNORMAL HIGH (ref 11.5–15.5)
WBC Count: 5.8 K/uL (ref 4.0–10.5)
nRBC: 0 % (ref 0.0–0.2)

## 2024-10-10 LAB — CMP (CANCER CENTER ONLY)
ALT: 12 U/L (ref 0–44)
AST: 30 U/L (ref 15–41)
Albumin: 4.5 g/dL (ref 3.5–5.0)
Alkaline Phosphatase: 93 U/L (ref 38–126)
Anion gap: 14 (ref 5–15)
BUN: 73 mg/dL — ABNORMAL HIGH (ref 8–23)
CO2: 31 mmol/L (ref 22–32)
Calcium: 9.2 mg/dL (ref 8.9–10.3)
Chloride: 95 mmol/L — ABNORMAL LOW (ref 98–111)
Creatinine: 2.04 mg/dL — ABNORMAL HIGH (ref 0.44–1.00)
GFR, Estimated: 23 mL/min — ABNORMAL LOW
Glucose, Bld: 128 mg/dL — ABNORMAL HIGH (ref 70–99)
Potassium: 4.3 mmol/L (ref 3.5–5.1)
Sodium: 140 mmol/L (ref 135–145)
Total Bilirubin: 0.4 mg/dL (ref 0.0–1.2)
Total Protein: 7.3 g/dL (ref 6.5–8.1)

## 2024-10-10 LAB — VITAMIN B12: Vitamin B-12: 863 pg/mL (ref 180–914)

## 2024-10-10 LAB — LACTATE DEHYDROGENASE: LDH: 120 U/L (ref 105–235)

## 2024-10-10 MED ORDER — CYANOCOBALAMIN 1000 MCG/ML IJ SOLN
1000.0000 ug | Freq: Once | INTRAMUSCULAR | Status: AC
  Administered 2024-10-10: 1000 ug via INTRAMUSCULAR
  Filled 2024-10-10: qty 1

## 2024-10-10 MED ORDER — EPOETIN ALFA-EPBX 10000 UNIT/ML IJ SOLN
10000.0000 [IU] | Freq: Once | INTRAMUSCULAR | Status: AC
  Administered 2024-10-10: 10000 [IU] via SUBCUTANEOUS
  Filled 2024-10-10: qty 1

## 2024-10-11 ENCOUNTER — Telehealth (HOSPITAL_BASED_OUTPATIENT_CLINIC_OR_DEPARTMENT_OTHER): Payer: Self-pay | Admitting: *Deleted

## 2024-10-11 NOTE — Telephone Encounter (Signed)
 CMN received for oxygen  signed by provider and faxed confirmation received [id number]

## 2024-10-13 LAB — METHYLMALONIC ACID, SERUM: Methylmalonic Acid, Quantitative: 785 nmol/L — ABNORMAL HIGH (ref 0–378)

## 2024-10-19 ENCOUNTER — Telehealth

## 2024-10-19 ENCOUNTER — Telehealth: Payer: Self-pay

## 2024-10-24 ENCOUNTER — Encounter (HOSPITAL_BASED_OUTPATIENT_CLINIC_OR_DEPARTMENT_OTHER): Attending: Internal Medicine | Admitting: Internal Medicine

## 2024-10-24 DIAGNOSIS — L89100 Pressure ulcer of unspecified part of back, unstageable: Secondary | ICD-10-CM | POA: Insufficient documentation

## 2024-10-24 DIAGNOSIS — Z9981 Dependence on supplemental oxygen: Secondary | ICD-10-CM | POA: Insufficient documentation

## 2024-10-24 DIAGNOSIS — J849 Interstitial pulmonary disease, unspecified: Secondary | ICD-10-CM | POA: Diagnosis not present

## 2024-10-27 ENCOUNTER — Encounter: Payer: Self-pay | Admitting: Family Medicine

## 2024-10-27 ENCOUNTER — Ambulatory Visit: Admitting: Family Medicine

## 2024-10-27 VITALS — BP 108/58 | HR 80 | Temp 98.2°F | Wt 103.0 lb

## 2024-10-27 DIAGNOSIS — D649 Anemia, unspecified: Secondary | ICD-10-CM

## 2024-10-27 DIAGNOSIS — J849 Interstitial pulmonary disease, unspecified: Secondary | ICD-10-CM

## 2024-10-27 DIAGNOSIS — N1832 Chronic kidney disease, stage 3b: Secondary | ICD-10-CM | POA: Diagnosis not present

## 2024-10-27 DIAGNOSIS — J9611 Chronic respiratory failure with hypoxia: Secondary | ICD-10-CM | POA: Diagnosis not present

## 2024-10-27 DIAGNOSIS — R0902 Hypoxemia: Secondary | ICD-10-CM

## 2024-10-27 DIAGNOSIS — R5383 Other fatigue: Secondary | ICD-10-CM

## 2024-10-27 DIAGNOSIS — M150 Primary generalized (osteo)arthritis: Secondary | ICD-10-CM | POA: Diagnosis not present

## 2024-10-27 LAB — COMPREHENSIVE METABOLIC PANEL WITH GFR
ALT: 11 U/L (ref 3–35)
AST: 23 U/L (ref 5–37)
Albumin: 4 g/dL (ref 3.5–5.2)
Alkaline Phosphatase: 75 U/L (ref 39–117)
BUN: 64 mg/dL — ABNORMAL HIGH (ref 6–23)
CO2: 34 meq/L — ABNORMAL HIGH (ref 19–32)
Calcium: 9.3 mg/dL (ref 8.4–10.5)
Chloride: 99 meq/L (ref 96–112)
Creatinine, Ser: 1.74 mg/dL — ABNORMAL HIGH (ref 0.40–1.20)
GFR: 26.3 mL/min — ABNORMAL LOW
Glucose, Bld: 115 mg/dL — ABNORMAL HIGH (ref 70–99)
Potassium: 4.8 meq/L (ref 3.5–5.1)
Sodium: 139 meq/L (ref 135–145)
Total Bilirubin: 0.5 mg/dL (ref 0.2–1.2)
Total Protein: 6.7 g/dL (ref 6.0–8.3)

## 2024-10-27 LAB — CBC WITH DIFFERENTIAL/PLATELET
Basophils Absolute: 0 K/uL (ref 0.0–0.1)
Basophils Relative: 0.9 % (ref 0.0–3.0)
Eosinophils Absolute: 0.1 K/uL (ref 0.0–0.7)
Eosinophils Relative: 2.5 % (ref 0.0–5.0)
HCT: 27.4 % — ABNORMAL LOW (ref 36.0–46.0)
Hemoglobin: 8.9 g/dL — ABNORMAL LOW (ref 12.0–15.0)
Lymphocytes Relative: 11.6 % — ABNORMAL LOW (ref 12.0–46.0)
Lymphs Abs: 0.6 K/uL — ABNORMAL LOW (ref 0.7–4.0)
MCHC: 32.5 g/dL (ref 30.0–36.0)
MCV: 103.2 fl — ABNORMAL HIGH (ref 78.0–100.0)
Monocytes Absolute: 0.3 K/uL (ref 0.1–1.0)
Monocytes Relative: 5.8 % (ref 3.0–12.0)
Neutro Abs: 4 K/uL (ref 1.4–7.7)
Neutrophils Relative %: 79.2 % — ABNORMAL HIGH (ref 43.0–77.0)
Platelets: 186 K/uL (ref 150.0–400.0)
RBC: 2.66 Mil/uL — ABNORMAL LOW (ref 3.87–5.11)
RDW: 17.7 % — ABNORMAL HIGH (ref 11.5–15.5)
WBC: 5.1 K/uL (ref 4.0–10.5)

## 2024-10-27 LAB — BRAIN NATRIURETIC PEPTIDE: Pro B Natriuretic peptide (BNP): 1792 pg/mL — ABNORMAL HIGH (ref 1.0–100.0)

## 2024-10-27 NOTE — Progress Notes (Unsigned)
 "  Acute Office Visit  Subjective:     Patient ID: Paula Ward, female    DOB: 1936-04-04, 89 y.o.   MRN: [id number]  No chief complaint on file.   HPI  Discussed the use of AI scribe software for clinical note transcription with the patient, who gave verbal consent to proceed.  History of Present Illness Paula Ward is an 89 year old female with anemia who presents with fatigue and dizziness.  Fatigue and dizziness - Persistent fatigue and dizziness limiting daily activities, including visiting her husband in long-term care - Falls asleep while eating breakfast due to fatigue - Energy level has not improved despite recent hospitalization, transfusion of 2 units of blood, and B12 injections - Hemoglobin recently in the 8-9 g/dL range  Edema and dyspnea - Leg and abdominal swelling present - Takes extra doses of Lasix  for leg edema - Requires effort to breathe and experiences palpitations - Uses home oxygen  therapy - Takes antihypertensive medication twice daily  Gastrointestinal symptoms - Dizziness associated with black to dark [place] stools - History of being told there was a little bit in her colon - Prior esophageal problems previously treated  Musculoskeletal and neurological symptoms - Arthritis pain in head and fingers - Numbness in fingers consistent with neuropathy - Foot pain managed by changing to more comfortable shoes     ROS Per HPI      Objective:    BP (!) 108/58 (BP Location: Left Arm, Patient Position: Sitting, Cuff Size: Small)   Pulse 80   Temp 98.2 F (36.8 C) (Temporal)   Wt 103 lb (46.7 kg)   SpO2 98%   BMI 18.25 kg/m    Physical Exam Vitals and nursing note reviewed.  Constitutional:      General: She is not in acute distress.    Comments: Underweight, appears fatigued  HENT:     Head: Normocephalic and atraumatic.     Right Ear: External ear normal.     Left Ear: External ear normal.     Nose: Nose normal.      Mouth/Throat:     Mouth: Mucous membranes are moist.     Pharynx: Oropharynx is clear.  Eyes:     Extraocular Movements: Extraocular movements intact.     Pupils: Pupils are equal, round, and reactive to light.  Cardiovascular:     Rate and Rhythm: Normal rate and regular rhythm.     Pulses: Normal pulses.     Heart sounds: Normal heart sounds.  Pulmonary:     Effort: Pulmonary effort is normal. No respiratory distress.     Breath sounds: Normal breath sounds. No wheezing, rhonchi or rales.     Comments: On 4lpm O2 via [place], decreased breath sounds to bilateral bases Musculoskeletal:     Right lower leg: No edema.     Left lower leg: Edema (+1 nonpitting) present.     Comments: In wheelchair for visit  Lymphadenopathy:     Cervical: No cervical adenopathy.  Neurological:     General: No focal deficit present.     Mental Status: She is alert and oriented to person, place, and time.  Psychiatric:        Mood and Affect: Mood normal.        Thought Content: Thought content normal.     No results found for any visits on 10/27/24.      Assessment & Plan:   Assessment and Plan Assessment & Plan Symptomatic Anemia Hemoglobin  levels decreased from nines to eights. Fatigue and dizziness reported. Received B12 injections  post-hospitalization. Itching possibly due to low iron . - 2 units PRBC given during hospitalization from 09/25/24-09/28/24 - Checked hemoglobin levels today. - Contact if hemoglobin is too low and hospitalization is needed. - Continue B12 injections as prescribed.  Hypoxemia requiring supplemental oxygen  Hypoxemia managed with supplemental oxygen . Oxygen  saturation stable without supplemental oxygen  at times. - Continue supplemental oxygen  as needed.  Chronic kidney disease Previous left kidney surgery. Referral to urology made post-discharge, but no contact yet. - Provided urology contact information. - Encouraged contact with urology for  follow-up.  Osteoarthritis Arthritis in jaw and fingers, occasional finger numbness. Leg swelling requires extra Lasix . - Continue Lasix  as needed for swelling.  General health maintenance Received RSV and flu vaccinations. Shingles vaccine pending.     Orders Placed This Encounter  Procedures   CBC with Differential/Platelet    Release to patient:   Immediate [1]   Comprehensive metabolic panel with GFR    Release to patient:   Immediate [1]   B Nat Peptide     No orders of the defined types were placed in this encounter.   No follow-ups on file.  Paula LITTIE Ku, FNP  "

## 2024-10-27 NOTE — Patient Instructions (Addendum)
 We are checking labs today, will be in contact with any results that require further attention  If I see that your hemoglobin is too low, we will need you get back to the hospital for further evaluation.   Follow-up with me for new or worsening symptoms.  [facility]  [phone]

## 2024-10-28 ENCOUNTER — Telehealth: Payer: Self-pay

## 2024-10-28 ENCOUNTER — Ambulatory Visit: Payer: Self-pay | Admitting: Family Medicine

## 2024-10-28 NOTE — Telephone Encounter (Signed)
 T/C from pt stating she saw her PCP yesterday and was advised to contact her hematologist.  Hgb 8.9 Pt is experiencing extreme fatigue and is sleeping more than usual.  Said she can barely function she is so tired. Please advise of any recommendations

## 2024-10-31 ENCOUNTER — Ambulatory Visit: Payer: Self-pay

## 2024-10-31 ENCOUNTER — Telehealth: Payer: Self-pay

## 2024-10-31 NOTE — Telephone Encounter (Signed)
 F/up T/C to pt regarding her fatigue to advise of the following from [place], [place] and Dr Federico  IT-I think her fatigue and anemia is secondary to her chronic disease. we have completed extensive workup for her including BMBx which didn't show any underlying bone marrow disorder She has bad interstitial lung disease   Paula Ward Federico IV, MD- we can continue with the vitamin b12, but I don't think her fatigue is blood related. Likely multifactorial with her other health issues  Pt advised of the above.  Pt thinks it may be due to her BP meds and has notified her Dr of her concerns

## 2024-10-31 NOTE — Telephone Encounter (Signed)
 FYI Only or Action Required?: Action required by provider: update on patient condition.  Patient was last seen in primary care on 10/27/2024 by [name], FNP.  Called Nurse Triage reporting No chief complaint on file..  Symptoms began several weeks ago.  Interventions attempted: Rest, hydration, or home remedies.  Symptoms are: gradually improving.  Triage Disposition: Call PCP Now  Patient/caregiver understands and will follow disposition?: Yes  Copied from CRM [id number]. Topic: Clinical - Medication Question >> Oct 31, 2024 10:31 AM [name] wrote: Patient thinks she taking too many of the diltiazem  (CARDIZEM ) 30 MG tablet medication. She's had some dizziness (not today) and is still feeling tired . She will be unavailable from 1pm-4pm to talk. Please call her at [phone], ideally before 1pm. Reason for Disposition  [1] Caller has URGENT question (includes prescribed medication questions) AND [2] triager unable to answer question  Answer Assessment - Initial Assessment Questions Patient trialed not taking an evening dose of cardizem  noticing that her fatigue shows improvement with BP increased.  Her SBP has been in the low 100-110's with bad fatigue,falling asleep at meals. Skipped he dose last night and BP improved to 131/75 and she feels significantly better. HR 84 and stable- not feeling racing or palpitations.   Pt does not want to take her evening dose of cardizem  anymore thinking it is contributing to her extreme fatigue. Please advise   1. MAIN CONCERN OR SYMPTOM:  What is your main concern right now? What question do you have? What's the main symptom you're worried about? (e.g., breathing difficulty, cough, fever, pain)     Wanting to cut back on Cardizem - thinks its causing the fatigue 2. ONSET: When did the  fatigue  start?     Weeks  3. BETTER-SAME-WORSE: Are you getting better, staying the same, or getting worse compared to how you felt at your  last visit to the doctor (most recent medical visit)?     better 4. VISIT DATE: When were you seen? (e.g., date)     1/8 5. VISIT DOCTOR: What is the name of the doctor taking care of you now?     [name] 6. VISIT DIAGNOSIS:  What was the main symptom or problem that you were seen for? Were you given a diagnosis?      Fatigue, anemia 7. VISIT MEDICINES: Did the doctor order any new medicines for you to use? If Yes, ask: Have you filled the prescription and started taking the medicine?      na 8. NEXT APPOINTMENT: Have you scheduled a follow-up appointment with your doctor?     tbd 9. PAIN: Is there any pain? If Yes, ask: How bad is it?  (Scale 0-10; or none, mild, moderate, severe)     denies 10. FEVER: Do you have a fever? If Yes, ask: What is it, how was it measured  and when did it start?       denies 11. OTHER SYMPTOMS: Do you have any other symptoms?       improvement  Protocols used: Recent Medical Visit for Illness Follow-up Call-[facility]

## 2024-10-31 NOTE — Telephone Encounter (Signed)
 Please contact patient and let her know that I have reviewed her medication concern.  I would recommend that she take 1/2 tablet (15 mg) in the evening, slightly increase her p.o. hydration, and continue to monitor her symptoms and blood pressure.  Thank you.  Paula Ward. Chariti Havel NP-C     10/31/2024, 8:58 PM [facility] [address] [place], [place] [address] Office [phone]   I spent 10 minutes reviewing patient's past cardiac history, cardiology clinic notes, and cardiac medications.

## 2024-11-01 ENCOUNTER — Ambulatory Visit: Payer: Self-pay

## 2024-11-01 MED ORDER — DILTIAZEM HCL 30 MG PO TABS: 15.0000 mg | ORAL_TABLET | Freq: Every day | ORAL | Status: AC

## 2024-11-01 NOTE — Telephone Encounter (Signed)
 1st CB attempt. LVM to call office back.     Copied from CRM [id number]. Topic: Clinical - Medication Question >> Oct 31, 2024 10:31 AM [name] wrote: Patient thinks she taking too many of the diltiazem  (CARDIZEM ) 30 MG tablet medication. She's had some dizziness (not today) and is still feeling tired . She will be unavailable from 1pm-4pm to talk. Please call her at [phone], ideally before 1pm. >> Nov 01, 2024  4:17 PM Drema MATSU wrote: Patient is requesting a callback regarding this request.

## 2024-11-01 NOTE — Telephone Encounter (Signed)
 Patient has been notified and voiced understanding.

## 2024-11-01 NOTE — Addendum Note (Signed)
 Addended by: SEBASTIAN, TEE Y on: 11/01/2024 05:11 PM   Modules accepted: Orders

## 2024-11-08 ENCOUNTER — Other Ambulatory Visit: Payer: Self-pay | Admitting: Internal Medicine

## 2024-11-11 ENCOUNTER — Other Ambulatory Visit: Payer: Self-pay | Admitting: Hematology and Oncology

## 2024-11-11 ENCOUNTER — Inpatient Hospital Stay: Attending: Physician Assistant | Admitting: Hematology and Oncology

## 2024-11-11 ENCOUNTER — Encounter: Payer: Self-pay | Admitting: Physician Assistant

## 2024-11-11 ENCOUNTER — Encounter (HOSPITAL_COMMUNITY): Payer: Self-pay

## 2024-11-11 ENCOUNTER — Telehealth: Payer: Self-pay | Admitting: Hematology and Oncology

## 2024-11-11 ENCOUNTER — Inpatient Hospital Stay

## 2024-11-11 VITALS — BP 134/58 | HR 79 | Temp 97.9°F | Resp 17 | Ht 63.0 in | Wt 106.0 lb

## 2024-11-11 DIAGNOSIS — E538 Deficiency of other specified B group vitamins: Secondary | ICD-10-CM

## 2024-11-11 DIAGNOSIS — D649 Anemia, unspecified: Secondary | ICD-10-CM | POA: Diagnosis not present

## 2024-11-11 DIAGNOSIS — D638 Anemia in other chronic diseases classified elsewhere: Secondary | ICD-10-CM | POA: Diagnosis not present

## 2024-11-11 LAB — CMP (CANCER CENTER ONLY)
ALT: 15 U/L (ref 0–44)
AST: 34 U/L (ref 15–41)
Albumin: 4.5 g/dL (ref 3.5–5.0)
Alkaline Phosphatase: 103 U/L (ref 38–126)
Anion gap: 11 (ref 5–15)
BUN: 56 mg/dL — ABNORMAL HIGH (ref 8–23)
CO2: 32 mmol/L (ref 22–32)
Calcium: 9.5 mg/dL (ref 8.9–10.3)
Chloride: 96 mmol/L — ABNORMAL LOW (ref 98–111)
Creatinine: 1.65 mg/dL — ABNORMAL HIGH (ref 0.44–1.00)
GFR, Estimated: 30 mL/min — ABNORMAL LOW
Glucose, Bld: 123 mg/dL — ABNORMAL HIGH (ref 70–99)
Potassium: 4.7 mmol/L (ref 3.5–5.1)
Sodium: 139 mmol/L (ref 135–145)
Total Bilirubin: 0.5 mg/dL (ref 0.0–1.2)
Total Protein: 7.5 g/dL (ref 6.5–8.1)

## 2024-11-11 LAB — CBC WITH DIFFERENTIAL (CANCER CENTER ONLY)
Abs Immature Granulocytes: 0.02 K/uL (ref 0.00–0.07)
Basophils Absolute: 0 K/uL (ref 0.0–0.1)
Basophils Relative: 1 %
Eosinophils Absolute: 0.1 K/uL (ref 0.0–0.5)
Eosinophils Relative: 2 %
HCT: 29.7 % — ABNORMAL LOW (ref 36.0–46.0)
Hemoglobin: 9.3 g/dL — ABNORMAL LOW (ref 12.0–15.0)
Immature Granulocytes: 0 %
Lymphocytes Relative: 9 %
Lymphs Abs: 0.6 K/uL — ABNORMAL LOW (ref 0.7–4.0)
MCH: 33.3 pg (ref 26.0–34.0)
MCHC: 31.3 g/dL (ref 30.0–36.0)
MCV: 106.5 fL — ABNORMAL HIGH (ref 80.0–100.0)
Monocytes Absolute: 0.3 K/uL (ref 0.1–1.0)
Monocytes Relative: 5 %
Neutro Abs: 5.1 K/uL (ref 1.7–7.7)
Neutrophils Relative %: 83 %
Platelet Count: 200 K/uL (ref 150–400)
RBC: 2.79 MIL/uL — ABNORMAL LOW (ref 3.87–5.11)
RDW: 15.8 % — ABNORMAL HIGH (ref 11.5–15.5)
WBC Count: 6.1 K/uL (ref 4.0–10.5)
nRBC: 0 % (ref 0.0–0.2)

## 2024-11-11 LAB — VITAMIN B12: Vitamin B-12: 963 pg/mL — ABNORMAL HIGH (ref 180–914)

## 2024-11-11 LAB — FERRITIN: Ferritin: 1140 ng/mL — ABNORMAL HIGH (ref 11–307)

## 2024-11-11 MED ORDER — EPOETIN ALFA-EPBX 20000 UNIT/ML IJ SOLN
20000.0000 [IU] | Freq: Once | INTRAMUSCULAR | Status: AC
  Administered 2024-11-11: 20000 [IU] via SUBCUTANEOUS
  Filled 2024-11-11: qty 1

## 2024-11-11 MED ORDER — CYANOCOBALAMIN 1000 MCG/ML IJ SOLN
1000.0000 ug | Freq: Once | INTRAMUSCULAR | Status: AC
  Administered 2024-11-11: 1000 ug via INTRAMUSCULAR
  Filled 2024-11-11: qty 1

## 2024-11-11 NOTE — Progress Notes (Unsigned)
 " Paula Ward [phone]   [phone]  PROGRESS NOTE  Patient Care Team: Rollene Almarie LABOR, MD as PCP - General (Internal Medicine) Pietro Redell RAMAN, MD as PCP - Cardiology (Cardiology) Prentiss Heddy HERO, RN as [facility] Management Wallace, Juana M, RN  CHIEF COMPLAINTS/PURPOSE OF CONSULTATION:  Macrocytic anemia  Vitamin B12 deficiency  INTERVAL HISTORY: Paula Ward 89 y.o. female returns for a follow up for macrocytic anemia.  She was last seen on 09/13/2024. In the interim, she continues on monthly B12 and retacrit  injections.   On exam today, Paula Ward reports she has been feeling tired overall in the M since our last visit.  Her weight is up by 2 pounds but she reports this is most likely fluid weight.  She is having some occasional lightheaded and dizziness but recently start drinking Pedialyte and reports it is helping greatly.  Her appetite is good and she recently tried eating more liver.  She drinks about 1 Gatorade today.  She reports her energy today is about a 9 out of 10 but her energy levels do not last.  She reports she falls asleep very easily.  She notes that she does have some occasional dizziness and moments of forgetfulness.. She continues on 4L 02 at all times.  She otherwise denies any fevers, chills, sweats, nausea, or diarrhea.  Full 10 point ROS is otherwise negative.   MEDICAL HISTORY:  Past Medical History:  Diagnosis Date   Anemia    Angiodysplasia of stomach and duodenum    Aortic stenosis    Arthus phenomenon    AVM (arteriovenous malformation)    Blood transfusion without reported diagnosis    Breast cancer (HCC) 1989   Left   Candida esophagitis (HCC)    Cataract    Chronic respiratory failure (HCC)    CKD stage 3b, GFR 30-44 ml/min (HCC)    Corneal edema    Corneal epithelial basement membrane dystrophy    CREST syndrome (HCC)    GERD (gastroesophageal reflux disease)    w/ HH   Interstitial lung  disease (HCC)    Nodule of kidney    Pericardial effusion    PONV (postoperative nausea and vomiting)    Pulmonary embolus (HCC) 2003   Pulmonary hypertension (HCC)    Rectal incontinence    Renal cell carcinoma (HCC)    Scleroderma (HCC)    Tubular adenoma of colon    Uterine polyp     SURGICAL HISTORY: Past Surgical History:  Procedure Laterality Date   APPENDECTOMY  1962   BREAST LUMPECTOMY  1989   left   CATARACT EXTRACTION, BILATERAL Bilateral 12/2013   COLONOSCOPY WITH PROPOFOL  N/A 04/15/2021   Procedure: COLONOSCOPY WITH PROPOFOL ;  Surgeon: Teressa Toribio SQUIBB, MD;  Location: [facility];  Service: Endoscopy;  Laterality: N/A;   ENTEROSCOPY N/A 01/18/2016   Procedure: ENTEROSCOPY;  Surgeon: Gustav Shila GAILS, MD;  Location: [facility];  Service: Endoscopy;  Laterality: N/A;   ENTEROSCOPY N/A 05/24/2018   Procedure: ENTEROSCOPY;  Surgeon: Charlanne Groom, MD;  Location: [facility];  Service: Endoscopy;  Laterality: N/A;   ENTEROSCOPY N/A 03/29/2021   Procedure: ENTEROSCOPY;  Surgeon: Albertus Gordy HERO, MD;  [facility]: [facility];  Service: Gastroenterology;  Laterality: N/A;   ENTEROSCOPY N/A 04/13/2021   Procedure: ENTEROSCOPY;  Surgeon: Leigh Elspeth SQUIBB, MD;  [facility]: [facility];  Service: Gastroenterology;  Laterality: N/A;   ENTEROSCOPY N/A 09/26/2024   Procedure: ENTEROSCOPY;  Surgeon: Abran Norleen SAILOR, MD;  [facility];  Service: Gastroenterology;  Laterality: N/A;   ESOPHAGOGASTRODUODENOSCOPY (EGD) WITH PROPOFOL  N/A 12/21/2015   Procedure: ESOPHAGOGASTRODUODENOSCOPY (EGD) WITH PROPOFOL ;  Surgeon: Norleen LOISE Kiang, MD;  [facility];  Service: Endoscopy;  Laterality: N/A;   HOT HEMOSTASIS N/A 05/24/2018   Procedure: HOT HEMOSTASIS (ARGON PLASMA COAGULATION/BICAP);  Surgeon: Charlanne Groom, MD;  Location: [facility];  Service: Endoscopy;  Laterality: N/A;   HOT HEMOSTASIS N/A 03/29/2021   Procedure: HOT HEMOSTASIS (ARGON PLASMA COAGULATION/BICAP);  Surgeon:  Albertus Gordy HERO, MD;  Location: [facility];  Service: Gastroenterology;  Laterality: N/A;   HOT HEMOSTASIS N/A 04/13/2021   Procedure: HOT HEMOSTASIS (ARGON PLASMA COAGULATION/BICAP);  Surgeon: Leigh Elspeth SQUIBB, MD;  Location: [facility];  Service: Gastroenterology;  Laterality: N/A;   INTRAMEDULLARY (IM) NAIL INTERTROCHANTERIC Left 10/11/2022   Procedure: INTRAMEDULLARY (IM) NAIL INTERTROCHANTERIC;  Surgeon: Melodi Lerner, MD;  [facility]: [facility];  Service: Orthopedics;  Laterality: Left;   IR GENERIC HISTORICAL  06/05/2016   IR RADIOLOGIST EVAL & MGMT 06/05/2016 Marcey Moan, MD [facility] INTERV RAD   IVC Filter     KIDNEY SURGERY     left -laser surgery by Dr. moan- 5 yrs ago no removal   RIGHT/LEFT HEART CATH AND CORONARY ANGIOGRAPHY N/A 04/25/2022   Procedure: RIGHT/LEFT HEART CATH AND CORONARY ANGIOGRAPHY;  Surgeon: Wonda Sharper, MD;  [facility] INV[facility] CV LAB;  Service: Cardiovascular;  Laterality: N/A;   SCHLEROTHERAPY  05/24/2018   Procedure: WALDEMAR;  Surgeon: Charlanne Groom, MD;  Location: [facility];  Service: Endoscopy;;   SUBMUCOSAL TATTOO INJECTION  04/13/2021   Procedure: SUBMUCOSAL TATTOO INJECTION;  Surgeon: Leigh Elspeth SQUIBB, MD;  [facility]: [facility];  Service: Gastroenterology;;   TONSILLECTOMY     TUBAL LIGATION      SOCIAL HISTORY: Social History   Socioeconomic History   Marital status: Married    Spouse name: Not on file   Number of children: 2   Years of education: Not on file   Highest education level: Not on file  Occupational History   Occupation: Retired  Tobacco Use   Smoking status: Never   Smokeless tobacco: Never  Vaping Use   Vaping status: Never Used  Substance and Sexual Activity   Alcohol use: No   Drug use: No   Sexual activity: Not Currently  Other Topics Concern   Not on file  Social History Narrative   Married '611 son- '65, 1 daughter '63; 6 children (2 adopted)SO- SOBRetirement- doing well. Marriage in good  health. Patient has never smoked. Alcohol use- noPt gets regular exercise   Social Drivers of Health   Tobacco Use: Low Risk (10/27/2024)   Patient History    Smoking Tobacco Use: Never    Smokeless Tobacco Use: Never    Passive Exposure: Not on file  Financial Resource Strain: Low Risk (04/02/2022)   Overall Financial Resource Strain (CARDIA)    Difficulty of Paying Living Expenses: Not hard at all  Food Insecurity: Patient Declined (09/25/2024)   Epic    Worried About Programme Researcher, Broadcasting/film/video in the Last Year: Patient declined    Barista in the Last Year: Patient declined  Transportation Needs: No Transportation Needs (09/25/2024)   Epic    Lack of Transportation (Medical): No    Lack of Transportation (Non-Medical): No  Physical Activity: Inactive (04/02/2022)   Exercise Vital Sign    Days of Exercise per Week: 0 days    Minutes of Exercise per Session: 0 min  Stress:  No Stress Concern Present (04/02/2022)   [employer] of Occupational Health - Occupational Stress Questionnaire    Feeling of Stress : Not at all  Social Connections: Socially Integrated (09/25/2024)   Social Connection and Isolation Panel    Frequency of Communication with Friends and Family: More than three times a week    Frequency of Social Gatherings with Friends and Family: More than three times a week    Attends Religious Services: More than 4 times per year    Active Member of Clubs or Organizations: Yes    Attends Banker Meetings: More than 4 times per year    Marital Status: Married  Catering Manager Violence: Patient Declined (09/25/2024)   Epic    Fear of Current or Ex-Partner: Patient declined    Emotionally Abused: Patient declined    Physically Abused: Patient declined    Sexually Abused: Patient declined  Depression (PHQ2-9): Low Risk (10/05/2024)   Depression (PHQ2-9)    PHQ-2 Score: 0  Alcohol Screen: Low Risk (04/02/2022)   Alcohol Screen    Last Alcohol Screening Score  (AUDIT): 0  Housing: Unknown (09/25/2024)   Epic    Unable to Pay for Housing in the Last Year: Patient declined    Number of Times Moved in the Last Year: 0    Homeless in the Last Year: Patient declined  Utilities: Patient Declined (09/25/2024)   Epic    Threatened with loss of utilities: Patient declined  Health Literacy: Not on file    FAMILY HISTORY: Family History  Problem Relation Age of Onset   Bladder Cancer Father    Diabetes Father    Prostate cancer Father    Alzheimer's disease Mother    Diabetes Sister    Lung cancer Sister         smoker   Esophageal cancer Paternal Uncle    Colon cancer Neg Hx     ALLERGIES:  is allergic to iodinated contrast media, codeine , other, erythromycin, lisinopril, mirtazapine , sulfa  antibiotics, and mycophenolate mofetil.  MEDICATIONS:  Current Outpatient Medications  Medication Sig Dispense Refill   acetaminophen  (TYLENOL ) 500 MG tablet Take 250-500 mg by mouth every 6 (six) hours as needed for mild pain (pain score 1-3) (or headaches).     ALPRAZolam  (XANAX ) 0.25 MG tablet Take 1 tablet (0.25 mg total) by mouth at bedtime as needed for anxiety. 30 tablet 1   antiseptic oral rinse (BIOTENE) LIQD 15 mLs by Mouth Rinse route as needed for dry mouth.     arformoterol  (BROVANA ) 15 MCG/2ML NEBU USE 1 VIAL IN NEBULIZER TWICE DAILY (Patient taking differently: Take 15 mcg by nebulization 2 (two) times daily.) 120 mL 0   augmented betamethasone dipropionate (DIPROLENE-AF) 0.05 % cream Apply 1 application  topically 2 (two) times daily as needed (for irritation).     clobetasol ointment (TEMOVATE) 0.05 % Apply 1 Application topically 2 (two) times daily as needed (for irritation).     diltiazem  (CARDIZEM ) 30 MG tablet Take 0.5 tablets (15 mg total) by mouth at bedtime.     docusate sodium  (COLACE) 100 MG capsule Take 100 mg by mouth daily.     famotidine  (PEPCID ) 20 MG tablet Take 1 tablet by mouth twice daily 180 tablet 0   fluticasone   (FLONASE ) 50 MCG/ACT nasal spray USE 1 SPRAY(S) IN EACH NOSTRIL ONCE DAILY AS NEEDED FOR ALLERGIES 16 g 0   furosemide  (LASIX ) 20 MG tablet Take 2 tablets ( 40 mg ) every morning may take extra  2 tablets for weight gain of 3 lbs 360 tablet 3   Ipratropium-Albuterol  (COMBIVENT  RESPIMAT) 20-100 MCG/ACT AERS respimat Inhale 1 puff into the lungs every 6 (six) hours. 1 each 6   ipratropium-albuterol  (DUONEB) 0.5-2.5 (3) MG/3ML SOLN Take 3 mLs by nebulization every 4 (four) hours as needed. 360 mL 6   latanoprost  (XALATAN ) 0.005 % ophthalmic solution Place 1 drop into both eyes at bedtime.     montelukast  (SINGULAIR ) 10 MG tablet Take 1 tablet (10 mg total) by mouth at bedtime. 90 tablet 3   Nutritional Supplements (ENSURE HIGH PROTEIN) LIQD Take 0.5 Bottles by mouth daily as needed (for supplementation).     OXYGEN  Inhale 3-4 L/min into the lungs continuous.     pantoprazole  (PROTONIX ) 40 MG tablet Take 1 tablet (40 mg total) by mouth 2 (two) times daily before a meal. 60 tablet 0   PRESCRIPTION MEDICATION CPAP- At bedtime and during any time of rest     sodium chloride  (OCEAN) 0.65 % SOLN nasal spray Place 1 spray into both nostrils as needed for congestion.     spironolactone  (ALDACTONE ) 25 MG tablet Take 1/2 tablet ( 12.5 mg ) every morning     sucralfate  (CARAFATE ) 1 GM/10ML suspension TAKE 10 MLS BY MOUTH EVERY 6 HOURS AS NEEDED FOR REFLUX (Patient taking differently: Take 1 g by mouth every 6 (six) hours as needed (if the stomach feels full).) 420 mL 0   triamcinolone  (KENALOG ) 0.1 % paste Use as directed 1 Application in the mouth or throat 2 (two) times daily. 5 g 12   triamcinolone  cream (KENALOG ) 0.1 % Apply 1 Application topically See admin instructions. Apply to affected leg(s) after showers and up to 2 additional times a day as needed for irritation     No current facility-administered medications for this visit.   Facility-Administered Medications Ordered in Other Visits  Medication  Dose Route Frequency Provider Last Rate Last Admin   cyanocobalamin  (VITAMIN B12) injection 1,000 mcg  1,000 mcg Intramuscular Once Thayil, Irene T, PA-C        REVIEW OF SYSTEMS:   Constitutional: ( - ) fevers, ( - )  chills , ( - ) night sweats Eyes: ( - ) blurriness of vision, ( - ) double vision, ( - ) watery eyes Ears, nose, mouth, throat, and face: ( - ) mucositis, ( - ) sore throat Respiratory: ( - ) cough, ( + ) dyspnea, ( - ) wheezes Cardiovascular: ( - ) palpitation, ( - ) chest discomfort, ( - ) lower extremity swelling Gastrointestinal:  ( - ) nausea, ( - ) heartburn, ( - ) change in bowel habits Skin: ( - ) abnormal skin rashes Lymphatics: ( - ) new lymphadenopathy, ( - ) easy bruising Neurological: ( - ) numbness, ( - ) tingling, ( - ) new weaknesses Behavioral/Psych: ( - ) mood change, ( - ) new changes  All other systems were reviewed with the patient and are negative.  PHYSICAL EXAMINATION: ECOG PERFORMANCE STATUS: 2 - Symptomatic, <50% confined to bed  Vitals:   11/11/24 1311  BP: (!) 134/58  Pulse: 79  Resp: 17  Temp: 97.9 F (36.6 C)  SpO2: 100%       Filed Weights   11/11/24 1311  Weight: 106 lb (48.1 kg)     GENERAL:  elderly female in NAD  SKIN: skin color, texture, turgor are normal, no rashes or significant lesions EYES: conjunctiva are pink and non-injected, sclera clear LUNGS: clear to  auscultation and percussion with normal breathing effort HEART: regular rate & rhythm and no murmurs and no lower extremity edema Musculoskeletal: no cyanosis of digits and no clubbing  PSYCH: alert & oriented x 3, fluent speech NEURO: no focal motor/sensory deficits  LABORATORY DATA:  I have reviewed the data as listed    Latest Ref Rng & Units 11/11/2024   12:45 PM 10/27/2024    2:43 PM 10/10/2024    1:45 PM  CBC  WBC 4.0 - 10.5 K/uL 6.1  5.1  5.8   Hemoglobin 12.0 - 15.0 g/dL 9.3  8.9 Repeated and verified X2.  8.4   Hematocrit 36.0 - 46.0 % 29.7   27.4  27.1   Platelets 150 - 400 K/uL 200  186.0  228        Latest Ref Rng & Units 11/11/2024   12:45 PM 10/27/2024    2:43 PM 10/10/2024    1:45 PM  CMP  Glucose 70 - 99 mg/dL 876  884  871   BUN 8 - 23 mg/dL 56  64  73   Creatinine 0.44 - 1.00 mg/dL 8.34  8.25  7.95   Sodium 135 - 145 mmol/L 139  139  140   Potassium 3.5 - 5.1 mmol/L 4.7  4.8  4.3   Chloride 98 - 111 mmol/L 96  99  95   CO2 22 - 32 mmol/L 32  34  31   Calcium  8.9 - 10.3 mg/dL 9.5  9.3  9.2   Total Protein 6.5 - 8.1 g/dL 7.5  6.7  7.3   Total Bilirubin 0.0 - 1.2 mg/dL 0.5  0.5  0.4   Alkaline Phos 38 - 126 U/L 103  75  93   AST 15 - 41 U/L 34  23  30   ALT 0 - 44 U/L 15  11  12     RADIOGRAPHIC STUDIES: No results found.    ASSESSMENT & PLAN Paula Ward is a 89 y.o. female who presents to the hematology clinic for evaluation of macrocytic anemia  #Macrocytic anemia: #Vitamin B12 deficiency: #Anemia of chronic disease --Suspect multifactorial including vitamin b12 deficinecy, CKD and chronic disease.  --Iron  panel from 12/30/2022 reviewed without deficiency.  --Patient had retacrit  10,000 units q 4 weeks, last received 12/30/2022. Will administer again today.  --Workup from 02/12/2023 didn't reveal hemolysis, paraproteinemia, folate deficiency. --bone marrow biopsy on 08/14/2023 showed no clear cause for the macrocytosis, though excessive iron  deposition was noted. HFE genetic testing was negative in November 2024 -- no evidence of cirrhosis on prior imaging from 2023 (Chest imaging, but liver visualized). --Labs from today show WBC 6.1, hemoglobin 9.3, MCV 106.5, platelets 200. Creatinine stable at 1.65, LFTs normal. B12 and MMA levels pending --recommend to continue with monthly retacrit  and B12 injection.   --RTC q 4 weeks for labs/injection and 12 weeks for labs/visit/injection  No orders of the defined types were placed in this encounter.   All questions were answered. The patient knows to call the  clinic with any problems, questions or concerns.  I have spent a total of 25 minutes minutes of face-to-face and non-face-to-face time, preparing to see the patient, performing a medically appropriate examination, counseling and educating the patient,documenting clinical information in the electronic health record, and care coordination.   Norleen IVAR Kidney, MD Department of Hematology/Oncology [facility] Phone: [phone] Pager: [phone] Email: [email or web address]     "

## 2024-11-11 NOTE — Telephone Encounter (Signed)
 Called pt to have future appts scheduled

## 2024-11-13 ENCOUNTER — Encounter: Payer: Self-pay | Admitting: Physician Assistant

## 2024-11-13 ENCOUNTER — Encounter (HOSPITAL_COMMUNITY): Payer: Self-pay

## 2024-11-14 ENCOUNTER — Other Ambulatory Visit: Payer: Self-pay

## 2024-11-14 LAB — METHYLMALONIC ACID, SERUM: Methylmalonic Acid, Quantitative: 750 nmol/L — ABNORMAL HIGH (ref 0–378)
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
## 2024-11-14 NOTE — Patient Instructions (Signed)
 Visit Information  Thank you for taking time to visit with me today. Please don't hesitate to contact me if I can be of assistance to you before our next scheduled appointment.  Your next care management appointment is by telephone on 12/15/24 at 10:00 am   Please call the care guide team at [phone] if you need to cancel, schedule, or reschedule an appointment.   Please call the Suicide and Crisis Lifeline: 988 call the [place]  National Suicide Prevention Lifeline: (623) 618-8413 or TTY: (867)625-0129 TTY (760)758-8824) to talk to a trained counselor if you are experiencing a Mental Health or Behavioral Health Crisis or need someone to talk to.   Heddy Shutter, RN, MSN, BSN, CCM [place]  [facility], Population Health Case Manager Phone: [phone]

## 2024-12-02 ENCOUNTER — Ambulatory Visit: Admitting: Gastroenterology

## 2024-12-09 ENCOUNTER — Inpatient Hospital Stay

## 2024-12-09 ENCOUNTER — Inpatient Hospital Stay: Attending: Physician Assistant

## 2024-12-15 ENCOUNTER — Telehealth

## 2024-12-27 ENCOUNTER — Ambulatory Visit: Admitting: Pulmonary Disease

## 2025-01-06 ENCOUNTER — Inpatient Hospital Stay

## 2025-01-06 ENCOUNTER — Inpatient Hospital Stay: Attending: Physician Assistant

## 2025-02-02 ENCOUNTER — Inpatient Hospital Stay

## 2025-02-02 ENCOUNTER — Inpatient Hospital Stay: Attending: Physician Assistant

## 2025-02-02 ENCOUNTER — Inpatient Hospital Stay: Admitting: Hematology and Oncology

## 2025-03-03 ENCOUNTER — Inpatient Hospital Stay: Attending: Physician Assistant

## 2025-03-03 ENCOUNTER — Inpatient Hospital Stay
